# Patient Record
Sex: Male | Born: 1940
Health system: Southern US, Community
[De-identification: ages and names within clinical notes are randomized; demographics above are authoritative.]

## PROBLEM LIST (undated history)

## (undated) DIAGNOSIS — I6529 Occlusion and stenosis of unspecified carotid artery: Secondary | ICD-10-CM

## (undated) DIAGNOSIS — Z8719 Personal history of other diseases of the digestive system: Secondary | ICD-10-CM

## (undated) DIAGNOSIS — Z9889 Other specified postprocedural states: Secondary | ICD-10-CM

## (undated) DIAGNOSIS — E119 Type 2 diabetes mellitus without complications: Secondary | ICD-10-CM

## (undated) DIAGNOSIS — I739 Peripheral vascular disease, unspecified: Secondary | ICD-10-CM

## (undated) DIAGNOSIS — T8859XA Other complications of anesthesia, initial encounter: Secondary | ICD-10-CM

## (undated) DIAGNOSIS — D649 Anemia, unspecified: Secondary | ICD-10-CM

## (undated) DIAGNOSIS — Z860100 Personal history of colon polyps, unspecified: Secondary | ICD-10-CM

## (undated) DIAGNOSIS — Z9289 Personal history of other medical treatment: Secondary | ICD-10-CM

## (undated) DIAGNOSIS — M1990 Unspecified osteoarthritis, unspecified site: Secondary | ICD-10-CM

## (undated) DIAGNOSIS — E785 Hyperlipidemia, unspecified: Secondary | ICD-10-CM

## (undated) DIAGNOSIS — I452 Bifascicular block: Secondary | ICD-10-CM

## (undated) DIAGNOSIS — I10 Essential (primary) hypertension: Secondary | ICD-10-CM

## (undated) DIAGNOSIS — R000 Tachycardia, unspecified: Secondary | ICD-10-CM

## (undated) DIAGNOSIS — R112 Nausea with vomiting, unspecified: Secondary | ICD-10-CM

## (undated) DIAGNOSIS — K219 Gastro-esophageal reflux disease without esophagitis: Secondary | ICD-10-CM

## (undated) DIAGNOSIS — T4145XA Adverse effect of unspecified anesthetic, initial encounter: Secondary | ICD-10-CM

## (undated) HISTORY — PX: ANGIOPLASTY / STENTING FEMORAL: SUR30

## (undated) HISTORY — PX: ANGIOPLASTY / STENTING ILIAC: SUR31

## (undated) HISTORY — DX: Personal history of colonic polyps: Z86.010

## (undated) HISTORY — PX: KNEE ARTHROSCOPY: SUR90

## (undated) HISTORY — DX: Anemia, unspecified: D64.9

## (undated) HISTORY — PX: EYE SURGERY: SHX253

## (undated) HISTORY — DX: Bifascicular block: I45.2

## (undated) HISTORY — DX: Hyperlipidemia, unspecified: E78.5

## (undated) HISTORY — DX: Peripheral vascular disease, unspecified: I73.9

## (undated) HISTORY — DX: Personal history of colon polyps, unspecified: Z86.0100

## (undated) HISTORY — DX: Unspecified osteoarthritis, unspecified site: M19.90

## (undated) HISTORY — DX: Type 2 diabetes mellitus without complications: E11.9

## (undated) HISTORY — DX: Essential (primary) hypertension: I10

## (undated) HISTORY — PX: COLONOSCOPY W/ POLYPECTOMY: SHX1380

---

## 2001-06-29 HISTORY — PX: ROTATOR CUFF REPAIR: SHX139

## 2008-08-16 ENCOUNTER — Encounter: Admission: RE | Admit: 2008-08-16 | Discharge: 2008-08-30 | Payer: Self-pay | Admitting: Internal Medicine

## 2010-05-07 ENCOUNTER — Encounter: Admission: RE | Admit: 2010-05-07 | Discharge: 2010-05-07 | Payer: Self-pay | Admitting: Cardiovascular Disease

## 2010-05-12 ENCOUNTER — Inpatient Hospital Stay (HOSPITAL_COMMUNITY)
Admission: RE | Admit: 2010-05-12 | Discharge: 2010-05-13 | Payer: Self-pay | Source: Home / Self Care | Admitting: Cardiovascular Disease

## 2010-05-12 HISTORY — PX: FEMORAL ARTERY STENT: SHX1583

## 2010-06-18 ENCOUNTER — Inpatient Hospital Stay (HOSPITAL_COMMUNITY)
Admission: EM | Admit: 2010-06-18 | Discharge: 2010-06-20 | Payer: Self-pay | Source: Home / Self Care | Attending: Internal Medicine | Admitting: Internal Medicine

## 2010-06-19 ENCOUNTER — Encounter: Payer: Self-pay | Admitting: Gastroenterology

## 2010-06-20 ENCOUNTER — Encounter (INDEPENDENT_AMBULATORY_CARE_PROVIDER_SITE_OTHER): Payer: Self-pay | Admitting: *Deleted

## 2010-07-31 NOTE — Letter (Signed)
Summary: New Patient letter  [facility]  [address]   [place], [place] [address]   Phone: [phone]  Fax: [phone]       06/20/2010 MRN: [id number]  [facility] [address] [place], [place]  [address]  [place]  Dear Mr. [facility],  Welcome to the Gastroenterology Division at [facility].    You are scheduled to see Dr.  Arlyce Dice on 08/04/2010 at 10:00am on the 3rd floor at [facility], [address]  We ask that you try to arrive at our office 15 minutes prior to your appointment time to allow for check-in.  We would like you to complete the enclosed self-administered evaluation form prior to your visit and bring it with you on the day of your appointment.  We will review it with you.  Also, please bring a complete list of all your medications or, if you prefer, bring the medication bottles and we will list them.  Please bring your insurance card so that we may make a copy of it.  If your insurance requires a referral to see a specialist, please bring your referral form from your primary care physician.  Co-payments are due at the time of your visit and may be paid by cash, check or credit card.     Your office visit will consist of a consult with your physician (includes a physical exam), any laboratory testing he/she may order, scheduling of any necessary diagnostic testing (e.g. x-ray, ultrasound, CT-scan), and scheduling of a procedure (e.g. Endoscopy, Colonoscopy) if required.  Please allow enough time on your schedule to allow for any/all of these possibilities.    If you cannot keep your appointment, please call [phone] to cancel or reschedule prior to your appointment date.  This allows [place] the opportunity to schedule an appointment for another patient in need of care.  If you do not cancel or reschedule by 5 p.m. the business day prior to your appointment date, you will be charged a $50.00 late cancellation/no-show fee.    Thank you  for choosing [facility] for your medical needs.  We appreciate the opportunity to care for you.  Please visit [place] at our website  to learn more about our practice.                     Sincerely,                                                             The Gastroenterology Division

## 2010-07-31 NOTE — Procedures (Signed)
Summary: Upper Endoscopy  Patient: Christian Bailey Note: All result statuses are Final unless otherwise noted.  Tests: (1) Upper Endoscopy (EGD)   EGD Upper Endoscopy       DONE     [facility]     [address]     [place], [place]  [address]           ENDOSCOPY PROCEDURE REPORT           PATIENT:  Christian, Bailey  MR#:  [id number]     BIRTHDATE:  [birth year], 69 yrs. old  GENDER:  male           ENDOSCOPIST:  Barbette Hair. Arlyce Dice, MD     Referred by:  Chilton Greathouse, M.D.           PROCEDURE DATE:  06/19/2010     PROCEDURE:  EGD with biopsy, [address]     ASA CLASS:  Class II     INDICATIONS:  hemorrhage of GI tract           MEDICATIONS:   Fentanyl 75 mcg IV, Versed 4 mg IV, glycopyrrolate     (Robinal) 0.2 mg IV     TOPICAL ANESTHETIC:  Cetacaine Spray           DESCRIPTION OF PROCEDURE:   After the risks benefits and     alternatives of the procedure were thoroughly explained, informed     consent was obtained.  The [id number] ([id number]) endoscope was     introduced through the mouth and advanced to the third portion of     the duodenum, without limitations.  The instrument was slowly     withdrawn as the mucosa was fully examined.     <<PROCEDUREIMAGES>>           An ulcer was found pyloric channel 2cm clean-based ulcer. Bx taken     to r/o H (see image003 and image004). pylori  Otherwise the     examination was normal.    Retroflexed views revealed no     abnormalities.    The scope was then withdrawn from the patient     and the procedure completed.           COMPLICATIONS:  None           ENDOSCOPIC IMPRESSION:     1) Ulcer in the pyloric channel     2) Otherwise normal examination     RECOMMENDATIONS:     1) continue PPI           REPEAT EXAM:  No           ______________________________     Barbette Hair. Arlyce Dice, MD           CC:           n.     eSIGNED:   Barbette Hair. Kaplan at 06/19/2010 11:49 AM           [name], [phone]  Note: An  exclamation mark (!) indicates a result that was not dispersed into the flowsheet. Document Creation Date: 06/19/2010 11:59 AM _______________________________________________________________________  (1) Order result status: Final Collection or observation date-time: 06/19/2010 11:44 Requested date-time:  Receipt date-time:  Reported date-time:  Referring Physician:   Ordering Physician: Melvia Heaps [phone]) Specimen Source:  Source: Launa Grill Order Number: [id number] Lab site:

## 2010-08-11 ENCOUNTER — Ambulatory Visit (INDEPENDENT_AMBULATORY_CARE_PROVIDER_SITE_OTHER): Payer: Medicare Other | Admitting: Gastroenterology

## 2010-08-11 ENCOUNTER — Encounter: Payer: Self-pay | Admitting: Gastroenterology

## 2010-08-11 DIAGNOSIS — K260 Acute duodenal ulcer with hemorrhage: Secondary | ICD-10-CM | POA: Insufficient documentation

## 2010-08-11 DIAGNOSIS — I2510 Atherosclerotic heart disease of native coronary artery without angina pectoris: Secondary | ICD-10-CM | POA: Insufficient documentation

## 2010-08-11 DIAGNOSIS — E119 Type 2 diabetes mellitus without complications: Secondary | ICD-10-CM | POA: Insufficient documentation

## 2010-08-20 NOTE — Assessment & Plan Note (Signed)
Summary: FU ULCER,CONSULT SCREEN COLON JJ/SCHED W-SARA PA/MEDICARE/CX .[name] KitchenMarland Kitchen   History of Present Illness Visit Type: Follow-up Consult Primary GI MD: Melvia Heaps MD [facility] Primary Provider: Lilli Few, MD Chief Complaint: Ulcer , consult screen  History of Present Illness:   Christian Bailey has returned following hospitalization for acute upper GI bleed.  A pyloric channel ulcer was seen.  He was treated medically including blood transfusions and discharged on twice a day Nexium.  Biopsies were negative for H. pylori.  He has felt well since discharge.  He is back on Plavix.  He has no other GI complaints.  He probably underwent a colonoscopy about 5-7 years ago where a small polyp was removed.  He was not told that he required any followup.   GI Review of Systems      Denies abdominal pain, acid reflux, belching, bloating, chest pain, dysphagia with liquids, dysphagia with solids, heartburn, loss of appetite, nausea, vomiting, vomiting blood, weight loss, and  weight gain.        Denies anal fissure, black tarry stools, change in bowel habit, constipation, diarrhea, diverticulosis, fecal incontinence, heme positive stool, hemorrhoids, irritable bowel syndrome, jaundice, light color stool, liver problems, rectal bleeding, and  rectal pain. Preventive Screening-Counseling & Management  Alcohol-Tobacco     Smoking Status: quit      Drug Use:  no.      Current Medications (verified): 1)  Nexium 40 Mg Cpdr (Esomeprazole Magnesium) .... Take 1 Capsule By Mouth Once A Day 2)  Crestor 20 Mg Tabs (Rosuvastatin Calcium) .... Once Daily 3)  Cilostazol 50 Mg Tabs (Cilostazol) .[name].. 1 By Mouth Two Times A Day 4)  Glucosamine Sulfate 1000 Mg Tabs (Glucosamine Sulfate) .[name].. 1 By Mouth Once Daily 5)  Lisinopril 5 Mg Tabs (Lisinopril) .[name].. 1 By Mouth Two Times A Day 6)  Metformin Hcl 500 Mg Tabs (Metformin Hcl) .[name].. 1 By Mouth Two Times A Day 7)  Toprol Xl 25 Mg Xr24h-Tab (Metoprolol Succinate) .[name].. 1 By  Mouth Once Daily 8)  Plavix 75 Mg Tabs (Clopidogrel Bisulfate) .[name].. 1 By Mouth Once Daily 9)  Ferrous Sulfate 325 (65 Fe) Mg Tbec (Ferrous Sulfate) .... Once Daily 10)  Aspirin 81 Mg Tbec (Aspirin) .... Once Daily 11)  Coq10 30 Mg Caps (Coenzyme Q10) .... Once Daily  Allergies (verified): No Known Drug Allergies  Past History:  Past Medical History: Reviewed history from 08/07/2010 and no changes required. Osteoarthritis Hyperlipidemia Upper Gastrointestinal bleed Acute Blood Anemia CAD Type 2 Diabetes  Past Surgical History: Knee Arthroscopy PTCA-Stent( Thighs) Rotator Cuff Repair  Family History: Leukemia: Brother x 2and Mother Family History of Heart Disease: Mother Family History of Esophageal Cancer:Brother Family History of Stomach Cancer:Father  Social History: Occupation: Retired Patient is a former smoker.  Alcohol Use - no Daily Caffeine Use Illicit Drug Use - no Smoking Status:  quit Drug Use:  no  Review of Systems  The patient denies allergy/sinus, anemia, anxiety-new, arthritis/joint pain, back pain, blood in urine, breast changes/lumps, change in vision, confusion, cough, coughing up blood, depression-new, fainting, fatigue, fever, headaches-new, hearing problems, heart murmur, heart rhythm changes, itching, menstrual pain, muscle pains/cramps, night sweats, nosebleeds, pregnancy symptoms, shortness of breath, skin rash, sleeping problems, sore throat, swelling of feet/legs, swollen lymph glands, thirst - excessive , urination - excessive , urination changes/pain, urine leakage, vision changes, and voice change.    Vital Signs:  Patient profile:   70 year old male Height:      65 inches  Weight:      166.50 pounds BMI:     27.81 Pulse rate:   88 / minute Pulse rhythm:   regular BP sitting:   110 / 68  (left arm) Cuff size:   regular  Vitals Entered By: June McMurray CMA [name]) (August 11, 2010 10:45 AM)  Physical Exam  Additional Exam:  On  physical exam he is well-developed well-nourished male  skin: anicteric HEENT: normocephalic; PEERLA; no nasal or pharyngeal abnormalities neck: supple nodes: no cervical lymphadenopathy chest: clear to ausculatation and percussion heart: no murmurs, gallops, or rubs abd: soft, nontender; BS normoactive; no abdominal masses, tenderness, organomegaly rectal: deferred ext: no cynanosis, clubbing, edema skeletal: no deformities neuro: oriented x 3; no focal abnormalities    Impression & Recommendations:  Problem # 1:  DUODENAL ULCER, ACUTE, HEMORRHAGE (ICD-532.00) Assessment Improved Plan to continue Nexium once a day indefinitely  Problem # 2:  CAD (ICD-414.00) Assessment: Comment Only  Problem # 3:  DM (ICD-250.00) Assessment: Comment Only  Patient Instructions: 1)  Copy sent to : Rava Avva, MD 2)  Follow up as needed  3)  The medication list was reviewed and reconciled.  All changed / newly prescribed medications were explained.  A complete medication list was provided to the patient / caregiver.

## 2010-09-04 ENCOUNTER — Encounter: Payer: Self-pay | Admitting: Gastroenterology

## 2010-09-08 LAB — CROSSMATCH
ABO/RH(D): O POS
Antibody Screen: NEGATIVE
Unit division: 0

## 2010-09-08 LAB — CBC
HCT: 22.8 % — ABNORMAL LOW (ref 39.0–52.0)
HCT: 24.1 % — ABNORMAL LOW (ref 39.0–52.0)
HCT: 27 % — ABNORMAL LOW (ref 39.0–52.0)
Hemoglobin: 7.9 g/dL — ABNORMAL LOW (ref 13.0–17.0)
Hemoglobin: 8.3 g/dL — ABNORMAL LOW (ref 13.0–17.0)
Hemoglobin: 9.3 g/dL — ABNORMAL LOW (ref 13.0–17.0)
MCH: 30.1 pg (ref 26.0–34.0)
MCH: 30.3 pg (ref 26.0–34.0)
MCH: 30.5 pg (ref 26.0–34.0)
MCH: 30.6 pg (ref 26.0–34.0)
MCH: 30.7 pg (ref 26.0–34.0)
MCHC: 34.1 g/dL (ref 30.0–36.0)
MCHC: 34.4 g/dL (ref 30.0–36.0)
MCHC: 34.4 g/dL (ref 30.0–36.0)
MCHC: 34.4 g/dL (ref 30.0–36.0)
MCHC: 34.6 g/dL (ref 30.0–36.0)
MCV: 87.9 fL (ref 78.0–100.0)
MCV: 88.8 fL (ref 78.0–100.0)
MCV: 89.3 fL (ref 78.0–100.0)
Platelets: 187 10*3/uL (ref 150–400)
Platelets: 209 10*3/uL (ref 150–400)
Platelets: 221 10*3/uL (ref 150–400)
RBC: 2.42 MIL/uL — ABNORMAL LOW (ref 4.22–5.81)
RBC: 2.7 MIL/uL — ABNORMAL LOW (ref 4.22–5.81)
RBC: 3.07 MIL/uL — ABNORMAL LOW (ref 4.22–5.81)
RDW: 13.3 % (ref 11.5–15.5)
RDW: 13.3 % (ref 11.5–15.5)
RDW: 13.4 % (ref 11.5–15.5)
RDW: 13.7 % (ref 11.5–15.5)
RDW: 14 % (ref 11.5–15.5)
WBC: 7.7 10*3/uL (ref 4.0–10.5)
WBC: 8.7 10*3/uL (ref 4.0–10.5)

## 2010-09-08 LAB — BASIC METABOLIC PANEL
BUN: 16 mg/dL (ref 6–23)
BUN: 25 mg/dL — ABNORMAL HIGH (ref 6–23)
CO2: 28 mEq/L (ref 19–32)
CO2: 29 mEq/L (ref 19–32)
Calcium: 7.9 mg/dL — ABNORMAL LOW (ref 8.4–10.5)
Calcium: 8.3 mg/dL — ABNORMAL LOW (ref 8.4–10.5)
Chloride: 103 mEq/L (ref 96–112)
Chloride: 109 mEq/L (ref 96–112)
Creatinine, Ser: 0.89 mg/dL (ref 0.4–1.5)
Creatinine, Ser: 0.93 mg/dL (ref 0.4–1.5)
GFR calc Af Amer: >60 mL/min (ref >60)
GFR calc Af Amer: >60 mL/min (ref >60)
GFR calc non Af Amer: >60 mL/min (ref >60)
Glucose, Bld: 115 mg/dL — ABNORMAL HIGH (ref 70–99)
Potassium: 3.9 mEq/L (ref 3.5–5.1)
Sodium: 140 mEq/L (ref 135–145)

## 2010-09-08 LAB — GLUCOSE, CAPILLARY
Glucose-Capillary: 103 mg/dL — ABNORMAL HIGH (ref 70–99)
Glucose-Capillary: 126 mg/dL — ABNORMAL HIGH (ref 70–99)
Glucose-Capillary: 139 mg/dL — ABNORMAL HIGH (ref 70–99)
Glucose-Capillary: 216 mg/dL — ABNORMAL HIGH (ref 70–99)
Glucose-Capillary: 235 mg/dL — ABNORMAL HIGH (ref 70–99)

## 2010-09-08 LAB — H. PYLORI ANTIBODY, IGG: H Pylori IgG: 2.1 {ISR} — ABNORMAL HIGH

## 2010-09-09 LAB — CBC
HCT: 37.2 % — ABNORMAL LOW (ref 39.0–52.0)
MCH: 29.7 pg (ref 26.0–34.0)
MCV: 89 fL (ref 78.0–100.0)
Platelets: 267 10*3/uL (ref 150–400)
RBC: 4.18 MIL/uL — ABNORMAL LOW (ref 4.22–5.81)

## 2010-09-09 LAB — GLUCOSE, CAPILLARY: Glucose-Capillary: 235 mg/dL — ABNORMAL HIGH (ref 70–99)

## 2010-09-09 LAB — BASIC METABOLIC PANEL
CO2: 28 mEq/L (ref 19–32)
Chloride: 105 mEq/L (ref 96–112)
Creatinine, Ser: 1.02 mg/dL (ref 0.4–1.5)
GFR calc Af Amer: >60 mL/min (ref >60)
Potassium: 3.8 mEq/L (ref 3.5–5.1)

## 2010-11-11 NOTE — Procedures (Signed)
Christian Bailey, Christian Bailey NO.:  [id number]   MEDICAL RECORD NO.:  [id number]          PATIENT TYPE:  INP   LOCATION:  6525                         FACILITY:  [facility]   PHYSICIAN:  Nanetta Batty, M.D.   DATE OF BIRTH:  1941-03-27   DATE OF PROCEDURE:  DATE OF DISCHARGE:                    PERIPHERAL VASCULAR INVASIVE PROCEDURE    Abdominal aortogram with bifemoral runoff, PTA and stent report.   The patient is a 70 year old married Caucasian male father of 2,  grandfather to no grandchildren, who I just saw in the office on April 10, 2010.  He relocated from the [facility] to [place] several  years ago.  He does have PVOD status post left external iliac artery PTA  and stenting as well as PTA of right SFA back in 2006.  His risk factors  include remote tobacco abuse, type 2 diabetes, and dyslipidemia.  He had  negative a Myoview in our office on October 04, 2007, with normal 2-D echo  recently.  Dr. Felipa Eth saw his lipid profile.  We have been getting  Dopplers on him which was recently performed a month ago that showed  increased velocities in the left external iliac artery and right SFA.  He has had increasing claudication, left greater than right.  He  presents now for angiography and potential intervention.   PROCEDURE DESCRIPTION:  The patient was brought to the second floor of  [facility] in the postabsorptive state.  He was  premedicated with p.o. Valium.  His left groin was prepped and shaved in  usual sterile fashion.  Xylocaine 1% was used for local anesthesia.  A 5-  French sheath was inserted into the left femoral artery using standard  Seldinger technique.  A 5-French pigtail catheter was used for abdominal  aortography with bifemoral runoff using bolus chase digital subtraction  step-table technique.  Visipaque dye was used for the entirety of the  case.  Retrograde aortic pressures were monitored during the case.   ANGIOGRAPHIC RESULT:  1. Abdominal aorta:      a.     Renal arteries - normal.      b.     Infrarenal abdominal aorta - normal.  2. Left lower extremity:      a.     Patent left external iliac artery stent.      b.     Focal 90% left common femoral artery stenosis with       obstruction to flow around a 5-French sheath.      c.     Total SFA with reconstitution of Hunter canal and two-vessel       runoff.  Posterior tibial was occluded and filled by collaterals.  3. Right lower extremity:      a.     Segmental 80% proximal followed by 60% to 70% segmental mid       right SFA with three-vessel runoff.   The patient received a total of 5000 units of heparin intravenously.  Contralateral access was obtained with a crossover catheter and 6-French  Ansel sheath.  The patient received 325  mg of aspirin and was already on  Plavix.  The Versacore wire was used to cross the SFA lesions and PTA  was performed with 4 x 4 balloons.  Stenting was then performed with a 6  x 100 Abbott Absolute stent distally and a 6 x 60 Cook Zilver stent  proximally.  Postdilatation performed with a 5 x 60 Fox Cross balloon in  both locations resulting in reduction of the proximal segmental 80%, mid  segmental 60% to 70% to 0% residual.  The sheath was then withdrawn  across the bifurcation.  The left common femoral artery stenosis was  predilated with a 5 x 2 balloon and stented with a 7 x 3 Smart stent  resulting in reduction of 90% stenosis to 0% residual with a small  subintimal flap distally that was non flow-limiting   IMPRESSION:  Successful percutaneous transluminal angioplasty and  stenting of the right superficial femoral artery in two locations, left  common femoral with nitinol self-expanding stents.  The sheath was sewn  securely in place.  The patient left the lab in stable condition.  He  will be hydrated overnight, treated with aspirin and Plavix.  The sheath  will be removed once the ACT falls  below 170.  He left the lab in stable  condition.      Nanetta Batty, M.D.      JB/MEDQ  D:  05/12/2010  T:  05/13/2010  Job:  [id number]   cc:   [facility]  [facility]  Larina Earthly, M.D.   Electronically Signed by Nanetta Batty M.D. on 05/16/2010 12:05:01 PM

## 2011-07-02 DIAGNOSIS — M25519 Pain in unspecified shoulder: Secondary | ICD-10-CM | POA: Diagnosis not present

## 2011-07-02 DIAGNOSIS — M19019 Primary osteoarthritis, unspecified shoulder: Secondary | ICD-10-CM | POA: Diagnosis not present

## 2011-07-03 DIAGNOSIS — L84 Corns and callosities: Secondary | ICD-10-CM | POA: Diagnosis not present

## 2011-07-03 DIAGNOSIS — L03039 Cellulitis of unspecified toe: Secondary | ICD-10-CM | POA: Diagnosis not present

## 2011-07-03 DIAGNOSIS — E119 Type 2 diabetes mellitus without complications: Secondary | ICD-10-CM | POA: Diagnosis not present

## 2011-07-03 DIAGNOSIS — L609 Nail disorder, unspecified: Secondary | ICD-10-CM | POA: Diagnosis not present

## 2011-09-07 DIAGNOSIS — Z961 Presence of intraocular lens: Secondary | ICD-10-CM | POA: Diagnosis not present

## 2011-09-21 DIAGNOSIS — I70219 Atherosclerosis of native arteries of extremities with intermittent claudication, unspecified extremity: Secondary | ICD-10-CM | POA: Diagnosis not present

## 2011-09-21 DIAGNOSIS — I739 Peripheral vascular disease, unspecified: Secondary | ICD-10-CM | POA: Diagnosis not present

## 2011-09-22 DIAGNOSIS — L608 Other nail disorders: Secondary | ICD-10-CM | POA: Diagnosis not present

## 2011-09-22 DIAGNOSIS — E119 Type 2 diabetes mellitus without complications: Secondary | ICD-10-CM | POA: Diagnosis not present

## 2011-10-26 DIAGNOSIS — H35369 Drusen (degenerative) of macula, unspecified eye: Secondary | ICD-10-CM | POA: Diagnosis not present

## 2011-10-26 DIAGNOSIS — H33109 Unspecified retinoschisis, unspecified eye: Secondary | ICD-10-CM | POA: Diagnosis not present

## 2011-10-26 DIAGNOSIS — E1139 Type 2 diabetes mellitus with other diabetic ophthalmic complication: Secondary | ICD-10-CM | POA: Diagnosis not present

## 2011-10-26 DIAGNOSIS — H353190 Nonexudative age-related macular degeneration, unspecified eye, stage unspecified: Secondary | ICD-10-CM | POA: Diagnosis not present

## 2011-10-27 DIAGNOSIS — I2510 Atherosclerotic heart disease of native coronary artery without angina pectoris: Secondary | ICD-10-CM | POA: Diagnosis not present

## 2011-10-27 DIAGNOSIS — Z125 Encounter for screening for malignant neoplasm of prostate: Secondary | ICD-10-CM | POA: Diagnosis not present

## 2011-10-27 DIAGNOSIS — E785 Hyperlipidemia, unspecified: Secondary | ICD-10-CM | POA: Diagnosis not present

## 2011-10-27 DIAGNOSIS — E119 Type 2 diabetes mellitus without complications: Secondary | ICD-10-CM | POA: Diagnosis not present

## 2011-10-27 DIAGNOSIS — I10 Essential (primary) hypertension: Secondary | ICD-10-CM | POA: Diagnosis not present

## 2011-11-02 DIAGNOSIS — H35359 Cystoid macular degeneration, unspecified eye: Secondary | ICD-10-CM | POA: Diagnosis not present

## 2011-11-02 DIAGNOSIS — E1139 Type 2 diabetes mellitus with other diabetic ophthalmic complication: Secondary | ICD-10-CM | POA: Diagnosis not present

## 2011-11-03 DIAGNOSIS — I2510 Atherosclerotic heart disease of native coronary artery without angina pectoris: Secondary | ICD-10-CM | POA: Diagnosis not present

## 2011-11-03 DIAGNOSIS — K279 Peptic ulcer, site unspecified, unspecified as acute or chronic, without hemorrhage or perforation: Secondary | ICD-10-CM | POA: Diagnosis not present

## 2011-11-03 DIAGNOSIS — Z0000 Encounter for general adult medical examination without abnormal findings: Secondary | ICD-10-CM | POA: Diagnosis not present

## 2011-11-03 DIAGNOSIS — Z125 Encounter for screening for malignant neoplasm of prostate: Secondary | ICD-10-CM | POA: Diagnosis not present

## 2011-11-05 DIAGNOSIS — H353290 Exudative age-related macular degeneration, unspecified eye, stage unspecified: Secondary | ICD-10-CM | POA: Diagnosis not present

## 2011-11-05 DIAGNOSIS — H35059 Retinal neovascularization, unspecified, unspecified eye: Secondary | ICD-10-CM | POA: Diagnosis not present

## 2011-11-09 DIAGNOSIS — Z1212 Encounter for screening for malignant neoplasm of rectum: Secondary | ICD-10-CM | POA: Diagnosis not present

## 2011-11-30 DIAGNOSIS — E782 Mixed hyperlipidemia: Secondary | ICD-10-CM | POA: Diagnosis not present

## 2011-11-30 DIAGNOSIS — I10 Essential (primary) hypertension: Secondary | ICD-10-CM | POA: Diagnosis not present

## 2011-11-30 DIAGNOSIS — R9431 Abnormal electrocardiogram [ECG] [EKG]: Secondary | ICD-10-CM | POA: Diagnosis not present

## 2011-11-30 DIAGNOSIS — E119 Type 2 diabetes mellitus without complications: Secondary | ICD-10-CM | POA: Diagnosis not present

## 2011-12-01 DIAGNOSIS — L608 Other nail disorders: Secondary | ICD-10-CM | POA: Diagnosis not present

## 2011-12-01 DIAGNOSIS — E119 Type 2 diabetes mellitus without complications: Secondary | ICD-10-CM | POA: Diagnosis not present

## 2011-12-10 DIAGNOSIS — H35059 Retinal neovascularization, unspecified, unspecified eye: Secondary | ICD-10-CM | POA: Diagnosis not present

## 2011-12-10 DIAGNOSIS — H353290 Exudative age-related macular degeneration, unspecified eye, stage unspecified: Secondary | ICD-10-CM | POA: Diagnosis not present

## 2011-12-15 ENCOUNTER — Ambulatory Visit: Payer: Medicare Other | Admitting: Gastroenterology

## 2012-01-13 DIAGNOSIS — H353290 Exudative age-related macular degeneration, unspecified eye, stage unspecified: Secondary | ICD-10-CM | POA: Diagnosis not present

## 2012-01-13 DIAGNOSIS — H35059 Retinal neovascularization, unspecified, unspecified eye: Secondary | ICD-10-CM | POA: Diagnosis not present

## 2012-01-25 ENCOUNTER — Encounter: Payer: Self-pay | Admitting: Gastroenterology

## 2012-01-25 ENCOUNTER — Ambulatory Visit (INDEPENDENT_AMBULATORY_CARE_PROVIDER_SITE_OTHER): Payer: Medicare Other | Admitting: Gastroenterology

## 2012-01-25 ENCOUNTER — Telehealth: Payer: Self-pay | Admitting: *Deleted

## 2012-01-25 VITALS — BP 90/48 | HR 96 | Ht 65.0 in | Wt 165.2 lb

## 2012-01-25 DIAGNOSIS — Z860100 Personal history of colon polyps, unspecified: Secondary | ICD-10-CM | POA: Insufficient documentation

## 2012-01-25 MED ORDER — PEG-KCL-NACL-NASULF-NA ASC-C 100 G PO SOLR: 1.0000 | Freq: Once | ORAL | Status: DC

## 2012-01-25 NOTE — Assessment & Plan Note (Addendum)
The patient gives a history of colon polyps and was last examined over 5 years ago. Pathology reports are not available.[name] He will  be scheduled with colonoscopy and Plavix will be held provided that his cardiologist agrees to this.

## 2012-01-25 NOTE — Progress Notes (Signed)
................   History of Present Illness: Pleasant 71 year old white male with history of colon polyps and bleeding duodenal ulcer referred at the request of Dr. Felipa Eth for colonoscopy.  5 years ago he underwent colonoscopy and polypectomy in [place] and was told to have a 5 year followup. He has no GI complaints including change of bowel habits, abdominal pain, melena or hematochezia. He has a  history of a bleeding duodenal ulcer in December, 2011. Patient is on Plavix.    Past Medical History  Diagnosis Date  . Osteoarthritis   . Hyperlipemia   . GI bleed   . Anemia   . CAD (coronary artery disease)   . Diabetes mellitus   . Hypertension   . History of colon polyps    Past Surgical History  Procedure Date  . Knee arthroscopy     left  . Angioplasty / stenting femoral   . Rotator cuff repair     right  . Angioplasty / stenting iliac    family history includes Diabetes in his brother; Esophageal cancer in his brother; Heart disease in his mother; Leukemia in his brother and mother; and Stomach cancer in his father. Current Outpatient Prescriptions  Medication Sig Dispense Refill  . cilostazol (PLETAL) 50 MG tablet Take 50 mg by mouth 2 (two) times daily.      . clopidogrel (PLAVIX) 75 MG tablet Take 75 mg by mouth daily.      [name] co-enzyme Q-10 30 MG capsule Take 30 mg by mouth daily.      [name] esomeprazole (NEXIUM) 40 MG capsule Take 40 mg by mouth daily before breakfast.      . lisinopril (PRINIVIL,ZESTRIL) 5 MG tablet Take 5 mg by mouth daily.      . metFORMIN (GLUCOPHAGE) 500 MG tablet Take 500 mg by mouth 2 (two) times daily with a meal.      . metoprolol succinate (TOPROL-XL) 25 MG 24 hr tablet Take 25 mg by mouth daily.      . rosuvastatin (CRESTOR) 20 MG tablet Take 20 mg by mouth daily.       Allergies as of 01/25/2012  . (No Known Allergies)    reports that he has quit smoking. He has never used smokeless tobacco. He reports that he drinks alcohol. He reports that  he does not use illicit drugs.     Review of Systems: He complains of claudication with extensive walking Pertinent positive and negative review of systems were noted in the above HPI section. All other review of systems were otherwise negative.  Vital signs were reviewed in today's medical record Physical Exam: General: Well developed , well nourished, no acute distress Head: Normocephalic and atraumatic Eyes:  sclerae anicteric, EOMI Ears: Normal auditory acuity Mouth: No deformity or lesions Neck: Supple, no masses or thyromegaly Lungs: Clear throughout to auscultation Heart: Regular rate and rhythm; no murmurs, rubs or bruits Abdomen: Soft, non tender and non distended. No masses, hepatosplenomegaly or hernias noted. Normal Bowel sounds Rectal:deferred Musculoskeletal: Symmetrical with no gross deformities  Skin: No lesions on visible extremities Pulses:  Normal pulses noted Extremities: No clubbing, cyanosis, edema or deformities noted Neurological: Alert oriented x 4, grossly nonfocal Cervical Nodes:  No significant cervical adenopathy Inguinal Nodes: No significant inguinal adenopathy Psychological:  Alert and cooperative. Normal mood and affect

## 2012-01-25 NOTE — Telephone Encounter (Signed)
[facility] [address] [place], [place] [address] [phone] phone [phone] fax 01/25/2012    RE: Christian Bailey DOB: 04/18/1941 MRN: [id number]   Dear Dr Allyson Sabal   We have scheduled the above patient for an endoscopic procedure. Our records show that he is on anticoagulation therapy.   Please advise as to how long the patient may come off his therapy of Plavix prior to the procedure, which is scheduled for 03/15/2012.  Please fax back/ or route the completed form to Shanequia Kendrick at [phone].   Sincerely,  Merri Ray

## 2012-01-25 NOTE — Patient Instructions (Addendum)
You will be contacted by our office prior to your procedure for directions on holding your Plavix.  If you do not hear from our office 1 week prior to your scheduled procedure, please call [phone] to discuss.   Separate instructions have been given

## 2012-02-02 DIAGNOSIS — L84 Corns and callosities: Secondary | ICD-10-CM | POA: Diagnosis not present

## 2012-02-02 DIAGNOSIS — E119 Type 2 diabetes mellitus without complications: Secondary | ICD-10-CM | POA: Diagnosis not present

## 2012-02-02 DIAGNOSIS — L608 Other nail disorders: Secondary | ICD-10-CM | POA: Diagnosis not present

## 2012-02-17 DIAGNOSIS — H35059 Retinal neovascularization, unspecified, unspecified eye: Secondary | ICD-10-CM | POA: Diagnosis not present

## 2012-02-17 DIAGNOSIS — H353290 Exudative age-related macular degeneration, unspecified eye, stage unspecified: Secondary | ICD-10-CM | POA: Diagnosis not present

## 2012-03-07 ENCOUNTER — Telehealth: Payer: Self-pay | Admitting: Gastroenterology

## 2012-03-07 NOTE — Telephone Encounter (Signed)
Called and spoke with [facility] . She is going to give Dr Renelda Mom nurse the message to call me back. 2 letters sent and one phone call still waiting on Plavix clearence

## 2012-03-07 NOTE — Telephone Encounter (Signed)
Still waiting to hear back form [facility]

## 2012-03-08 NOTE — Telephone Encounter (Signed)
Received fax from Dr Renelda Mom office. Stating it would be ok for the pt to hold the Plavix 5 days prior to procedure  They are to fax a form to me tomorrow when Dr berry is back in the office for our varifacation

## 2012-03-09 NOTE — Telephone Encounter (Signed)
Pt approved to hold plavix 5 days before  Patient aware to hold

## 2012-03-09 NOTE — Telephone Encounter (Signed)
Patient aware to hold plavix 5 days before procedure per Dr Allyson Sabal. Paper of approval to scanned in

## 2012-03-15 ENCOUNTER — Ambulatory Visit (AMBULATORY_SURGERY_CENTER): Payer: Medicare Other | Admitting: Gastroenterology

## 2012-03-15 ENCOUNTER — Encounter: Payer: Self-pay | Admitting: Gastroenterology

## 2012-03-15 VITALS — BP 113/66 | HR 88 | Temp 97.0°F | Resp 18 | Ht 65.0 in | Wt 165.0 lb

## 2012-03-15 DIAGNOSIS — D126 Benign neoplasm of colon, unspecified: Secondary | ICD-10-CM

## 2012-03-15 DIAGNOSIS — Z860100 Personal history of colon polyps, unspecified: Secondary | ICD-10-CM

## 2012-03-15 DIAGNOSIS — K5730 Diverticulosis of large intestine without perforation or abscess without bleeding: Secondary | ICD-10-CM

## 2012-03-15 LAB — GLUCOSE, CAPILLARY
Glucose-Capillary: 129 mg/dL — ABNORMAL HIGH (ref 70–99)
Glucose-Capillary: 98 mg/dL (ref 70–99)

## 2012-03-15 MED ORDER — SODIUM CHLORIDE 0.9 % IV SOLN: 500.0000 mL | INTRAVENOUS | Status: DC

## 2012-03-15 NOTE — Progress Notes (Signed)
Patient did not have preoperative order for IV antibiotic SSI prophylaxis. (G8918)  Patient did not experience any of the following events: a burn prior to discharge; a fall within the facility; wrong site/side/patient/procedure/implant event; or a hospital transfer or hospital admission upon discharge from the facility. (G8907)  

## 2012-03-15 NOTE — Op Note (Signed)
[facility] [address] [place], [address]   COLONOSCOPY PROCEDURE REPORT  PATIENT: Christian Bailey, Christian Bailey  MR#: [id number] BIRTHDATE: Mar 24, 1941 , 71  yrs. old GENDER: Male ENDOSCOPIST: Louis Meckel, MD REFERRED BY: PROCEDURE DATE:  03/15/2012 PROCEDURE:   Colonoscopy with snare polypectomy ASA CLASS:   Class II INDICATIONS: MEDICATIONS: MAC sedation, administered by CRNA and propofol (Diprivan) 150mg  IV  DESCRIPTION OF PROCEDURE:   After the risks benefits and alternatives of the procedure were thoroughly explained, informed consent was obtained.  A digital rectal exam revealed no abnormalities of the rectum.   The LB [id number]  endoscope was introduced through the anus and advanced to the cecum, which was identified by both the appendix and ileocecal valve. No adverse events experienced.   The quality of the prep was Suprep excellent The instrument was then slowly withdrawn as the colon was fully examined.      COLON FINDINGS: A sessile polyp measuring 5 mm in size was found at the cecum.  A polypectomy was performed with a cold snare.  The resection was complete and the polyp tissue was completely retrieved.   Mild diverticulosis was noted in the transverse colon. The colon mucosa was otherwise normal.  Retroflexed views revealed no abnormalities. The time to cecum=2 minutes 20 seconds. Withdrawal time=7 minutes 44 seconds.  The scope was withdrawn and the procedure completed. COMPLICATIONS: There were no complications.  ENDOSCOPIC IMPRESSION: 1.   Sessile polyp measuring 5 mm in size was found at the cecum; polypectomy was performed with a cold snare 2.   Mild diverticulosis was noted in the transverse colon 3.   The colon mucosa was otherwise normal  RECOMMENDATIONS: 1.  If the polyp(s) removed today are proven to be adenomatous (pre-cancerous) polyps, you will need a repeat colonoscopy in 5 years.  Otherwise you should continue to follow  colorectal cancer screening guidelines for "routine risk" patients with colonoscopy in 10 years.  You will receive a letter within 1-2 weeks with the results of your biopsy as well as final recommendations.  Please call my office if you have not received a letter after 3 weeks. 2.  Resume plavix in 5 days   eSigned:  Louis Meckel, MD 03/15/2012 8:32 AM   cc:   PATIENT NAME:  [name], [name] MR#: [id number]

## 2012-03-15 NOTE — Patient Instructions (Addendum)
Restart plavix in 5 daysYOU HAD AN ENDOSCOPIC PROCEDURE TODAY AT THE [facility]: Refer to the procedure report that was given to you for any specific questions about what was found during the examination.  If the procedure report does not answer your questions, please call your gastroenterologist to clarify.  If you requested that your care partner not be given the details of your procedure findings, then the procedure report has been included in a sealed envelope for you to review at your convenience later.  YOU SHOULD EXPECT: Some feelings of bloating in the abdomen. Passage of more gas than usual.  Walking can help get rid of the air that was put into your GI tract during the procedure and reduce the bloating. If you had a lower endoscopy (such as a colonoscopy or flexible sigmoidoscopy) you may notice spotting of blood in your stool or on the toilet paper. If you underwent a bowel prep for your procedure, then you may not have a normal bowel movement for a few days.  DIET: Your first meal following the procedure should be a light meal and then it is ok to progress to your normal diet.  A half-sandwich or bowl of soup is an example of a good first meal.  Heavy or fried foods are harder to digest and may make you feel nauseous or bloated.  Likewise meals heavy in dairy and vegetables can cause extra gas to form and this can also increase the bloating.  Drink plenty of fluids but you should avoid alcoholic beverages for 24 hours.  ACTIVITY: Your care partner should take you home directly after the procedure.  You should plan to take it easy, moving slowly for the rest of the day.  You can resume normal activity the day after the procedure however you should NOT DRIVE or use heavy machinery for 24 hours (because of the sedation medicines used during the test).    SYMPTOMS TO REPORT IMMEDIATELY: A gastroenterologist can be reached at any hour.  During normal business hours, 8:30 AM to 5:00 PM  Monday through Friday, call [phone].  After hours and on weekends, please call the GI answering service at [phone] who will take a message and have the physician on call contact you.   Following lower endoscopy (colonoscopy or flexible sigmoidoscopy):  Excessive amounts of blood in the stool  Significant tenderness or worsening of abdominal pains  Swelling of the abdomen that is new, acute  Fever of 100F or higher    FOLLOW UP: If any biopsies were taken you will be contacted by phone or by letter within the next 1-3 weeks.  Call your gastroenterologist if you have not heard about the biopsies in 3 weeks.  Our staff will call the home number listed on your records the next business day following your procedure to check on you and address any questions or concerns that you may have at that time regarding the information given to you following your procedure. This is a courtesy call and so if there is no answer at the home number and we have not heard from you through the emergency physician on call, we will assume that you have returned to your regular daily activities without incident.  SIGNATURES/CONFIDENTIALITY: You and/or your care partner have signed paperwork which will be entered into your electronic medical record.  These signatures attest to the fact that that the information above on your After Visit Summary has been reviewed and is understood.  Full  responsibility of the confidentiality of this discharge information lies with you and/or your care-partner.     INFORMATION ON POLYPS GIVEN TO YOU TODAY AND INFORMATION ON DIVERTICULOSIS AND HIGH FIBER DIET GIVEN TO YOU AS WELL

## 2012-03-16 ENCOUNTER — Telehealth: Payer: Self-pay | Admitting: *Deleted

## 2012-03-16 NOTE — Telephone Encounter (Signed)
  Follow up Call-  Call back number 03/15/2012  Post procedure Call Back phone  # [phone]  Permission to leave phone message Yes     Patient questions:  Do you have a fever, pain , or abdominal swelling? no Pain Score  0 *  Have you tolerated food without any problems? yes  Have you been able to return to your normal activities? yes  Do you have any questions about your discharge instructions: Diet   no Medications  no Follow up visit  no  Do you have questions or concerns about your Care? no  Actions: * If pain score is 4 or above: No action needed, pain <4.

## 2012-03-22 ENCOUNTER — Encounter: Payer: Self-pay | Admitting: Gastroenterology

## 2012-03-23 DIAGNOSIS — H35429 Microcystoid degeneration of retina, unspecified eye: Secondary | ICD-10-CM | POA: Diagnosis not present

## 2012-03-23 DIAGNOSIS — H353290 Exudative age-related macular degeneration, unspecified eye, stage unspecified: Secondary | ICD-10-CM | POA: Diagnosis not present

## 2012-03-23 DIAGNOSIS — H35059 Retinal neovascularization, unspecified, unspecified eye: Secondary | ICD-10-CM | POA: Diagnosis not present

## 2012-04-05 DIAGNOSIS — I70219 Atherosclerosis of native arteries of extremities with intermittent claudication, unspecified extremity: Secondary | ICD-10-CM | POA: Diagnosis not present

## 2012-04-05 DIAGNOSIS — I739 Peripheral vascular disease, unspecified: Secondary | ICD-10-CM | POA: Diagnosis not present

## 2012-04-08 DIAGNOSIS — E119 Type 2 diabetes mellitus without complications: Secondary | ICD-10-CM | POA: Diagnosis not present

## 2012-04-08 DIAGNOSIS — L608 Other nail disorders: Secondary | ICD-10-CM | POA: Diagnosis not present

## 2012-04-19 DIAGNOSIS — Z23 Encounter for immunization: Secondary | ICD-10-CM | POA: Diagnosis not present

## 2012-05-03 DIAGNOSIS — H35059 Retinal neovascularization, unspecified, unspecified eye: Secondary | ICD-10-CM | POA: Diagnosis not present

## 2012-05-03 DIAGNOSIS — H353290 Exudative age-related macular degeneration, unspecified eye, stage unspecified: Secondary | ICD-10-CM | POA: Diagnosis not present

## 2012-06-07 DIAGNOSIS — H35059 Retinal neovascularization, unspecified, unspecified eye: Secondary | ICD-10-CM | POA: Diagnosis not present

## 2012-06-07 DIAGNOSIS — M1990 Unspecified osteoarthritis, unspecified site: Secondary | ICD-10-CM | POA: Diagnosis not present

## 2012-06-07 DIAGNOSIS — H353290 Exudative age-related macular degeneration, unspecified eye, stage unspecified: Secondary | ICD-10-CM | POA: Diagnosis not present

## 2012-06-07 DIAGNOSIS — H3570 Unspecified separation of retinal layers: Secondary | ICD-10-CM | POA: Diagnosis not present

## 2012-06-07 DIAGNOSIS — E119 Type 2 diabetes mellitus without complications: Secondary | ICD-10-CM | POA: Diagnosis not present

## 2012-06-07 DIAGNOSIS — E785 Hyperlipidemia, unspecified: Secondary | ICD-10-CM | POA: Diagnosis not present

## 2012-06-07 DIAGNOSIS — I10 Essential (primary) hypertension: Secondary | ICD-10-CM | POA: Diagnosis not present

## 2012-06-07 DIAGNOSIS — H35429 Microcystoid degeneration of retina, unspecified eye: Secondary | ICD-10-CM | POA: Diagnosis not present

## 2012-06-09 DIAGNOSIS — E119 Type 2 diabetes mellitus without complications: Secondary | ICD-10-CM | POA: Diagnosis not present

## 2012-06-09 DIAGNOSIS — E782 Mixed hyperlipidemia: Secondary | ICD-10-CM | POA: Diagnosis not present

## 2012-06-09 DIAGNOSIS — I739 Peripheral vascular disease, unspecified: Secondary | ICD-10-CM | POA: Diagnosis not present

## 2012-06-09 DIAGNOSIS — I10 Essential (primary) hypertension: Secondary | ICD-10-CM | POA: Diagnosis not present

## 2012-06-20 DIAGNOSIS — H353190 Nonexudative age-related macular degeneration, unspecified eye, stage unspecified: Secondary | ICD-10-CM | POA: Diagnosis not present

## 2012-06-20 DIAGNOSIS — E119 Type 2 diabetes mellitus without complications: Secondary | ICD-10-CM | POA: Diagnosis not present

## 2012-06-20 DIAGNOSIS — Z961 Presence of intraocular lens: Secondary | ICD-10-CM | POA: Diagnosis not present

## 2012-06-24 DIAGNOSIS — L608 Other nail disorders: Secondary | ICD-10-CM | POA: Diagnosis not present

## 2012-06-24 DIAGNOSIS — E119 Type 2 diabetes mellitus without complications: Secondary | ICD-10-CM | POA: Diagnosis not present

## 2012-07-20 DIAGNOSIS — H35059 Retinal neovascularization, unspecified, unspecified eye: Secondary | ICD-10-CM | POA: Diagnosis not present

## 2012-07-20 DIAGNOSIS — H353290 Exudative age-related macular degeneration, unspecified eye, stage unspecified: Secondary | ICD-10-CM | POA: Diagnosis not present

## 2012-08-29 DIAGNOSIS — H353290 Exudative age-related macular degeneration, unspecified eye, stage unspecified: Secondary | ICD-10-CM | POA: Diagnosis not present

## 2012-08-29 DIAGNOSIS — H35359 Cystoid macular degeneration, unspecified eye: Secondary | ICD-10-CM | POA: Diagnosis not present

## 2012-10-12 DIAGNOSIS — H35059 Retinal neovascularization, unspecified, unspecified eye: Secondary | ICD-10-CM | POA: Diagnosis not present

## 2012-10-12 DIAGNOSIS — H353290 Exudative age-related macular degeneration, unspecified eye, stage unspecified: Secondary | ICD-10-CM | POA: Diagnosis not present

## 2012-11-22 DIAGNOSIS — L608 Other nail disorders: Secondary | ICD-10-CM | POA: Diagnosis not present

## 2012-11-22 DIAGNOSIS — L84 Corns and callosities: Secondary | ICD-10-CM | POA: Diagnosis not present

## 2012-11-22 DIAGNOSIS — E1159 Type 2 diabetes mellitus with other circulatory complications: Secondary | ICD-10-CM | POA: Diagnosis not present

## 2012-11-24 DIAGNOSIS — I2510 Atherosclerotic heart disease of native coronary artery without angina pectoris: Secondary | ICD-10-CM | POA: Diagnosis not present

## 2012-11-24 DIAGNOSIS — E785 Hyperlipidemia, unspecified: Secondary | ICD-10-CM | POA: Diagnosis not present

## 2012-11-24 DIAGNOSIS — E119 Type 2 diabetes mellitus without complications: Secondary | ICD-10-CM | POA: Diagnosis not present

## 2012-11-24 DIAGNOSIS — I10 Essential (primary) hypertension: Secondary | ICD-10-CM | POA: Diagnosis not present

## 2012-11-24 DIAGNOSIS — Z125 Encounter for screening for malignant neoplasm of prostate: Secondary | ICD-10-CM | POA: Diagnosis not present

## 2012-11-24 DIAGNOSIS — I739 Peripheral vascular disease, unspecified: Secondary | ICD-10-CM | POA: Diagnosis not present

## 2012-11-28 DIAGNOSIS — I10 Essential (primary) hypertension: Secondary | ICD-10-CM | POA: Diagnosis not present

## 2012-11-28 DIAGNOSIS — Z125 Encounter for screening for malignant neoplasm of prostate: Secondary | ICD-10-CM | POA: Diagnosis not present

## 2012-11-28 DIAGNOSIS — K279 Peptic ulcer, site unspecified, unspecified as acute or chronic, without hemorrhage or perforation: Secondary | ICD-10-CM | POA: Diagnosis not present

## 2012-11-28 DIAGNOSIS — E119 Type 2 diabetes mellitus without complications: Secondary | ICD-10-CM | POA: Diagnosis not present

## 2012-11-28 DIAGNOSIS — Z1331 Encounter for screening for depression: Secondary | ICD-10-CM | POA: Diagnosis not present

## 2012-11-28 DIAGNOSIS — I2510 Atherosclerotic heart disease of native coronary artery without angina pectoris: Secondary | ICD-10-CM | POA: Diagnosis not present

## 2012-11-28 DIAGNOSIS — I739 Peripheral vascular disease, unspecified: Secondary | ICD-10-CM | POA: Diagnosis not present

## 2012-11-28 DIAGNOSIS — M1990 Unspecified osteoarthritis, unspecified site: Secondary | ICD-10-CM | POA: Diagnosis not present

## 2012-11-28 DIAGNOSIS — Z0000 Encounter for general adult medical examination without abnormal findings: Secondary | ICD-10-CM | POA: Diagnosis not present

## 2012-11-28 DIAGNOSIS — E785 Hyperlipidemia, unspecified: Secondary | ICD-10-CM | POA: Diagnosis not present

## 2012-12-01 DIAGNOSIS — Z1212 Encounter for screening for malignant neoplasm of rectum: Secondary | ICD-10-CM | POA: Diagnosis not present

## 2012-12-05 ENCOUNTER — Other Ambulatory Visit: Payer: Self-pay | Admitting: Cardiovascular Disease

## 2012-12-07 DIAGNOSIS — H35059 Retinal neovascularization, unspecified, unspecified eye: Secondary | ICD-10-CM | POA: Diagnosis not present

## 2012-12-07 DIAGNOSIS — H353290 Exudative age-related macular degeneration, unspecified eye, stage unspecified: Secondary | ICD-10-CM | POA: Diagnosis not present

## 2012-12-13 ENCOUNTER — Encounter: Payer: Self-pay | Admitting: Cardiovascular Disease

## 2012-12-14 ENCOUNTER — Encounter: Payer: Self-pay | Admitting: Cardiovascular Disease

## 2012-12-14 ENCOUNTER — Ambulatory Visit (INDEPENDENT_AMBULATORY_CARE_PROVIDER_SITE_OTHER): Payer: Medicare Other | Admitting: Cardiovascular Disease

## 2012-12-14 VITALS — BP 102/56 | HR 103 | Ht 65.0 in | Wt 163.9 lb

## 2012-12-14 DIAGNOSIS — E119 Type 2 diabetes mellitus without complications: Secondary | ICD-10-CM | POA: Insufficient documentation

## 2012-12-14 DIAGNOSIS — E785 Hyperlipidemia, unspecified: Secondary | ICD-10-CM | POA: Diagnosis not present

## 2012-12-14 DIAGNOSIS — I10 Essential (primary) hypertension: Secondary | ICD-10-CM | POA: Diagnosis not present

## 2012-12-14 DIAGNOSIS — I739 Peripheral vascular disease, unspecified: Secondary | ICD-10-CM | POA: Diagnosis not present

## 2012-12-14 DIAGNOSIS — I2510 Atherosclerotic heart disease of native coronary artery without angina pectoris: Secondary | ICD-10-CM | POA: Diagnosis not present

## 2012-12-14 NOTE — Assessment & Plan Note (Signed)
On statin therapy followed by his primary care physician 

## 2012-12-14 NOTE — Assessment & Plan Note (Signed)
Status post left common iliac artery stenting remotely in [place]. I intermittent him 05/12/10 revealing an intact left common iliac artery stent with high-grade left external iliac artery stenosis which I stented with a 7 mm x 3 cm Nitinol self expanding stent. I also put 2 stents in his right SFA. He had an occluded left SFA.his left Dopplers in our office performed 04/05/12 revealed right ABI 0.75 with patent stents, a left ABI 0.58 with patent iliac stents and an known occluded left SFA. He says over the last 6 months his claudication has somewhat worsened. We'll get followup or extremity arterial Dopplers on him in October

## 2012-12-14 NOTE — Patient Instructions (Signed)
  We will see you back in follow up in 6 months with an extender and 12 months with Dr Allyson Sabal  Dr Allyson Sabal has ordered lower extremity dopplers to be done in October

## 2012-12-14 NOTE — Progress Notes (Signed)
12/14/2012 Christian Bailey   01/29/1941  [id number]  Primary Physician Christian Sauer, MD Primary Cardiologist: Christian Gess MD Christian Bailey   HPI:  The patient is a very pleasant 72 year old, married Caucasian male, father of 2, grandfather to 1 grandchild who I last saw 6 months ago. His daughter-in-law is a primary care physician at [employer]. He has a history of PVOD and stenting of his left common iliac artery 20 years ago. He was smoking 3 packs a day at that time but stopped smoking 20 years ago.   His other problems include hypertension, hyperlipidemia and noninsulin-requiring diabetes. He had a negative Myoview in our office August 18, 2010. Because of claudication, I performed angiograph on him May 12, 2010, revealing an intact left common iliac artery stent, high-grade left external iliac artery stenosis which I stented using a 7 mm x 3 cm long Nitinol self-expanding stent. I also put 2 stents in his right SFA. Followup Dopplers showed improvement but symptomatically he did not really improve much. He still complains of predictable bilateral lower extremity claudication at several hundred yards. His Dopplers have remained stable. Dr. Felipa Bailey follows his lipid profile. He denies chest pain or shortness of breath.  Since I last saw him 6 months ago he says his claudication has somewhat worsened. Worse before he was able to walk 30 minutes on a treadmill now he can walk 20.     Current Outpatient Prescriptions  Medication Sig Dispense Refill  . cilostazol (PLETAL) 50 MG tablet Take 50 mg by mouth 2 (two) times daily.      . clopidogrel (PLAVIX) 75 MG tablet TAKE 1 TABLET EVERY DAY  90 tablet  0  . co-enzyme Q-10 30 MG capsule Take 30 mg by mouth daily.      [name] esomeprazole (NEXIUM) 40 MG capsule Take 40 mg by mouth daily before breakfast.      . glucosamine-chondroitin 500-400 MG tablet Take 1 tablet by mouth 2 (two) times daily.      [name] lisinopril (PRINIVIL,ZESTRIL) 5  MG tablet Take 5 mg by mouth 2 (two) times daily.       . metFORMIN (GLUCOPHAGE) 500 MG tablet Take 1,000 mg by mouth 2 (two) times daily with a meal.       . metoprolol succinate (TOPROL-XL) 25 MG 24 hr tablet Take 25 mg by mouth 2 (two) times daily.       . rosuvastatin (CRESTOR) 20 MG tablet Take 20 mg by mouth daily.       No current facility-administered medications for this visit.    No Known Allergies  History   Social History  . Marital Status: Married    Spouse Name: N/A    Number of Children: 2  . Years of Education: N/A   Occupational History  . retired   .     Social History Main Topics  . Smoking status: Former Games developer  . Smokeless tobacco: Never Used  . Alcohol Use: Yes     Comment: rarely  . Drug Use: No  . Sexually Active: Not on file   Other Topics Concern  . Not on file   Social History Narrative  . No narrative on file     Review of Systems: General: negative for chills, fever, night sweats or weight changes.  Cardiovascular: negative for chest pain, dyspnea on exertion, edema, orthopnea, palpitations, paroxysmal nocturnal dyspnea or shortness of breath Dermatological: negative for rash Respiratory: negative for cough or wheezing Urologic: negative for hematuria  Abdominal: negative for nausea, vomiting, diarrhea, bright red blood per rectum, melena, or hematemesis Neurologic: negative for visual changes, syncope, or dizziness All other systems reviewed and are otherwise negative except as noted above.    Blood pressure 102/56, pulse 103, height 5\' 5"  (1.651 m), weight 163 lb 14.4 oz (74.345 kg).  General appearance: alert and no distress Neck: no adenopathy, no carotid bruit, no JVD, supple, symmetrical, trachea midline and thyroid not enlarged, symmetric, no tenderness/mass/nodules Lungs: clear to auscultation bilaterally Heart: regular rate and rhythm, S1, S2 normal, no murmur, click, rub or gallop Extremities: extremities normal, atraumatic,  no cyanosis or edema and soft bilateral femoral bruits  EKG sinus tachycardia at 103 with right bundle branch block unchanged from his prior EKG  ASSESSMENT AND PLAN:   Essential hypertension Under good control on appropriate medications  Hyperlipidemia On statin therapy followed by his primary care physician  Claudication Status post left common iliac artery stenting remotely in [place]. I intermittent him 05/12/10 revealing an intact left common iliac artery stent with high-grade left external iliac artery stenosis which I stented with a 7 mm x 3 cm Nitinol self expanding stent. I also put 2 stents in his right SFA. He had an occluded left SFA.his left Dopplers in our office performed 04/05/12 revealed right ABI 0.75 with patent stents, a left ABI 0.58 with patent iliac stents and an known occluded left SFA. He says over the last 6 months his claudication has somewhat worsened. We'll get followup or extremity arterial Dopplers on him in October      Christian Gess MD [facility], [facility] 12/14/2012 10:51 AM

## 2012-12-14 NOTE — Assessment & Plan Note (Signed)
Under good control on appropriate medications

## 2013-01-24 DIAGNOSIS — L608 Other nail disorders: Secondary | ICD-10-CM | POA: Diagnosis not present

## 2013-01-24 DIAGNOSIS — E1159 Type 2 diabetes mellitus with other circulatory complications: Secondary | ICD-10-CM | POA: Diagnosis not present

## 2013-02-16 DIAGNOSIS — H35059 Retinal neovascularization, unspecified, unspecified eye: Secondary | ICD-10-CM | POA: Diagnosis not present

## 2013-02-16 DIAGNOSIS — H3570 Unspecified separation of retinal layers: Secondary | ICD-10-CM | POA: Diagnosis not present

## 2013-02-16 DIAGNOSIS — H353290 Exudative age-related macular degeneration, unspecified eye, stage unspecified: Secondary | ICD-10-CM | POA: Diagnosis not present

## 2013-02-16 DIAGNOSIS — H33109 Unspecified retinoschisis, unspecified eye: Secondary | ICD-10-CM | POA: Diagnosis not present

## 2013-02-22 DIAGNOSIS — H35059 Retinal neovascularization, unspecified, unspecified eye: Secondary | ICD-10-CM | POA: Diagnosis not present

## 2013-02-22 DIAGNOSIS — H353290 Exudative age-related macular degeneration, unspecified eye, stage unspecified: Secondary | ICD-10-CM | POA: Diagnosis not present

## 2013-03-09 ENCOUNTER — Other Ambulatory Visit: Payer: Self-pay | Admitting: Cardiovascular Disease

## 2013-03-09 NOTE — Telephone Encounter (Signed)
Rx was sent to pharmacy electronically. 

## 2013-03-29 DIAGNOSIS — H353290 Exudative age-related macular degeneration, unspecified eye, stage unspecified: Secondary | ICD-10-CM | POA: Diagnosis not present

## 2013-03-29 DIAGNOSIS — H35059 Retinal neovascularization, unspecified, unspecified eye: Secondary | ICD-10-CM | POA: Diagnosis not present

## 2013-03-30 ENCOUNTER — Encounter (HOSPITAL_COMMUNITY): Payer: Self-pay | Admitting: *Deleted

## 2013-04-20 ENCOUNTER — Ambulatory Visit (HOSPITAL_COMMUNITY)
Admission: RE | Admit: 2013-04-20 | Discharge: 2013-04-20 | Disposition: A | Discharge status: Home / Self Care | Payer: Medicare Other | Source: Ambulatory Visit | Attending: Cardiology | Admitting: Cardiology

## 2013-04-20 DIAGNOSIS — I739 Peripheral vascular disease, unspecified: Secondary | ICD-10-CM | POA: Diagnosis not present

## 2013-04-20 NOTE — Progress Notes (Signed)
Lower Extremity Arterial Duplex Completed. °Brianna L Mazza,RVT °

## 2013-04-25 ENCOUNTER — Encounter: Payer: Self-pay | Admitting: *Deleted

## 2013-04-25 ENCOUNTER — Telehealth: Payer: Self-pay | Admitting: *Deleted

## 2013-04-25 DIAGNOSIS — I739 Peripheral vascular disease, unspecified: Secondary | ICD-10-CM

## 2013-04-25 NOTE — Telephone Encounter (Signed)
Order placed for repeat lower extremity dopplers in 1 year 

## 2013-04-25 NOTE — Telephone Encounter (Signed)
Message copied by Marella Bile on Tue Apr 25, 2013  4:40 PM ------      Message from: Runell Gess      Created: Sat Apr 22, 2013  5:01 PM       No change from prior study. Repeat in 12 months. ------

## 2013-05-03 DIAGNOSIS — H353290 Exudative age-related macular degeneration, unspecified eye, stage unspecified: Secondary | ICD-10-CM | POA: Diagnosis not present

## 2013-05-03 DIAGNOSIS — H35059 Retinal neovascularization, unspecified, unspecified eye: Secondary | ICD-10-CM | POA: Diagnosis not present

## 2013-06-12 ENCOUNTER — Encounter: Payer: Self-pay | Admitting: Cardiology

## 2013-06-12 ENCOUNTER — Ambulatory Visit (INDEPENDENT_AMBULATORY_CARE_PROVIDER_SITE_OTHER): Payer: Medicare Other | Admitting: Cardiology

## 2013-06-12 VITALS — BP 130/68 | HR 88 | Ht 65.0 in | Wt 166.0 lb

## 2013-06-12 DIAGNOSIS — E785 Hyperlipidemia, unspecified: Secondary | ICD-10-CM

## 2013-06-12 DIAGNOSIS — I452 Bifascicular block: Secondary | ICD-10-CM | POA: Diagnosis not present

## 2013-06-12 DIAGNOSIS — I739 Peripheral vascular disease, unspecified: Secondary | ICD-10-CM | POA: Diagnosis not present

## 2013-06-12 DIAGNOSIS — I10 Essential (primary) hypertension: Secondary | ICD-10-CM

## 2013-06-12 DIAGNOSIS — E119 Type 2 diabetes mellitus without complications: Secondary | ICD-10-CM

## 2013-06-12 MED ORDER — OMEGA-3-ACID ETHYL ESTERS 1 G PO CAPS: 1.0000 g | ORAL_CAPSULE | Freq: Every day | ORAL | Status: DC

## 2013-06-12 MED ORDER — CILOSTAZOL 50 MG PO TABS: 100.0000 mg | ORAL_TABLET | Freq: Two times a day (BID) | ORAL | Status: DC

## 2013-06-12 NOTE — Assessment & Plan Note (Signed)
No change in EKG

## 2013-06-12 NOTE — Assessment & Plan Note (Signed)
ABIs done in 03/2013 Lt 0.67, Rt 0.81 no change  Pt will follow up with Dr. Allyson Sabal in 6 months though he will call if further problems.

## 2013-06-12 NOTE — Progress Notes (Signed)
06/12/2013   PCP: Hoyle Sauer, MD   Chief Complaint  Patient presents with  . Follow-up    6 mo visit    Primary Cardiologist: Dr. Allyson Sabal  HPI:  72 year old, married Caucasian male, father of 2, grandfather to 1 grandchild who is followed by Dr. Allyson Sabal. His daughter-in-law is a primary care physician at [employer]. He has a history of PVOD and stenting of his left common iliac artery 20 years ago. He was smoking 3 packs a day at that time but stopped smoking 20 years ago.   He has no Known CAD.  His other problems include hypertension, hyperlipidemia and noninsulin-requiring diabetes. He had a negative Myoview in our office August 18, 2010.   Because of claudication, Dr. Allyson Sabal performed angiograph on him May 12, 2010, revealing an intact left common iliac artery stent, high-grade left external iliac artery stenosis which I stented using a 7 mm x 3 cm long Nitinol self-expanding stent. I also put 2 stents in his right SFA. Followup Dopplers showed improvement but symptomatically he did not really improve much. He still complains of predictable bilateral lower extremity claudication at several hundred yards. His Dopplers have remained stable. Dr. Felipa Eth follows his lipid profile. He denies chest pain or shortness of breath.  Today he states his claudication has somewhat worsened. Worse before he was able to walk 30 minutes on a treadmill now he can walk 20. We discussed increasing his Pletal to see if this will help.  Also his HDL is low so he will try fish oil.     No Known Allergies  Current Outpatient Prescriptions  Medication Sig Dispense Refill  . clopidogrel (PLAVIX) 75 MG tablet TAKE 1 TABLET DAILY  90 tablet  2  . co-enzyme Q-10 30 MG capsule Take 30 mg by mouth daily.      [name] esomeprazole (NEXIUM) 40 MG capsule Take 40 mg by mouth daily before breakfast.      . glucosamine-chondroitin 500-400 MG tablet Take 1 tablet by mouth 2 (two) times daily.      [name]  lisinopril (PRINIVIL,ZESTRIL) 5 MG tablet Take 5 mg by mouth 2 (two) times daily.       . metFORMIN (GLUCOPHAGE) 500 MG tablet Take 1,000 mg by mouth 2 (two) times daily with a meal.       . metoprolol succinate (TOPROL-XL) 25 MG 24 hr tablet Take 25 mg by mouth 2 (two) times daily.       . rosuvastatin (CRESTOR) 20 MG tablet Take 20 mg by mouth daily.      . cilostazol (PLETAL) 50 MG tablet Take 2 tablets (100 mg total) by mouth 2 (two) times daily.  60 tablet  1  . omega-3 acid ethyl esters (LOVAZA) 1 G capsule Take 1 capsule (1 g total) by mouth daily.  60 capsule  6   No current facility-administered medications for this visit.    Past Medical History  Diagnosis Date  . Osteoarthritis   . Hyperlipemia   . GI bleed   . Anemia   . CAD (coronary artery disease)   . Diabetes mellitus   . Hypertension   . History of colon polyps   . Claudication     LEA DOPPLER, 04/05/2012 - BILATERAL ABIs-moderate arterial insufficiency; RIGHT SFA STENT(S)-50-69% diameter reduction; LEFT SFA-occluded at proximal to mid level; LEFT RUNOFF-posterior tibial artery demonstrated occlusive disease  . RBBB (right bundle branch block)     NUCLEAR STRESS  TEST, 08/18/2010 - no significant wall motion abnoramlities noted, post-stress EF 69%, normal myocardial perfusion study  . SOBOE (shortness of breath on exertion)     2D ECHO, 10/04/2007 - EF >55%, normal-mild  . PAD (peripheral artery disease), hx remote stent Lt common iliac, 04/2010-stent to lt common iliac & 2 stents to the Rt SFA 06/12/2013  . RBBB (right bundle branch block with left anterior fascicular block) 06/12/2013    Past Surgical History  Procedure Laterality Date  . Knee arthroscopy      left  . Angioplasty / stenting femoral    . Angioplasty / stenting iliac    . Rotator cuff repair Right 2003  . Femoral artery stent Right 05/12/2010    Stented distally with a 6x100 Abbott absolute stent and proximally with a 6x60 Cook Zilver stent resulting  in the reduction of the proximal segment 80% and mid segment 60-70% to 0% residual, LEFT common femoral artery stented with a 7x3 Smart stent resulting in reduction of 90% stenosis to 0% residual    WUJ:WJXBJYN:WG colds or fevers, no weight changes, exercises 2-3 X week at the gym, eats healthy Skin:no rashes or ulcers HEENT:no blurred vision, no congestion CV:see HPI PUL:see HPI GI:no diarrhea constipation or melena- though hemorrhoids so occ bright blood, no indigestion GU:no hematuria, no dysuria MS:no joint pain, + claudication continues and maybe mildly increased Neuro:no syncope, no lightheadedness Endo:no diabetes, no thyroid disease  PHYSICAL EXAM BP 130/68  Pulse 88  Ht 5\' 5"  (1.651 m)  Wt 166 lb (75.297 kg)  BMI 27.62 kg/m2 General:Pleasant affect, NAD Skin:Warm and dry, brisk capillary refill HEENT:normocephalic, sclera clear, mucus membranes moist Neck:supple, no JVD, no bruits  Heart:S1S2 RRR without murmur, gallup, rub or click Lungs:clear without rales, rhonchi, or wheezes NFA:OZHY, non tender, + BS, do not palpate liver spleen or masses Ext:no lower ext edema,  2+ radial pulses Neuro:alert and oriented, MAE, follows commands, + facial symmetry  EKG:SR with RBBB & LAFB chronic HR 96 no acute changes otherwise.  ASSESSMENT AND PLAN Claudication Some increase in claudication, will increase Pletal to 100 mg BID if no improvement he will decrease back to 50 mg BID.  If improvement we will send new prescription for 100 mg tab BID.  PAD (peripheral artery disease), hx remote stent Lt common iliac, 04/2010-stent to lt common iliac & 2 stents to the Rt SFA ABIs done in 03/2013 Lt 0.67, Rt 0.81 no change  Pt will follow up with Dr. Allyson Sabal in 6 months though he will call if further problems.  Hyperlipidemia Last HDL of 30 he is agreeable to try Lovaza 1 gm twice a day to improve HDL, also will increase his gym visits.  Last LDL 83.  No Hx CAD, neg nuc.  RBBB (right  bundle branch block with left anterior fascicular block) No change in EKG  Type 2 diabetes mellitus Followed by PCP.

## 2013-06-12 NOTE — Assessment & Plan Note (Signed)
Last HDL of 30 he is agreeable to try Lovaza 1 gm twice a day to improve HDL, also will increase his gym visits.  Last LDL 83.  No Hx CAD, neg nuc.

## 2013-06-12 NOTE — Assessment & Plan Note (Signed)
Followed by PCP

## 2013-06-12 NOTE — Assessment & Plan Note (Signed)
Some increase in claudication, will increase Pletal to 100 mg BID if no improvement he will decrease back to 50 mg BID.  If improvement we will send new prescription for 100 mg tab BID.

## 2013-06-12 NOTE — Patient Instructions (Signed)
Increase your Pletal to 2 of 50 mg tabs twice a day if improvement in legs let us know and we will change to 100 mg tablet daily, if no improvement call and we will just continue 50 mg twice a day.  I added Lovaza 1 gm twice a day -fish oil- to help raise your good cholesterol.  Call if any problems.  Follow up with Dr. Allyson Sabal in 6 months.

## 2013-06-16 ENCOUNTER — Telehealth: Payer: Self-pay | Admitting: Cardiovascular Disease

## 2013-06-16 MED ORDER — OMEGA-3-ACID ETHYL ESTERS 1 G PO CAPS: 1.0000 g | ORAL_CAPSULE | Freq: Two times a day (BID) | ORAL | Status: DC

## 2013-06-16 MED ORDER — CILOSTAZOL 50 MG PO TABS: 100.0000 mg | ORAL_TABLET | Freq: Two times a day (BID) | ORAL | Status: DC

## 2013-06-16 NOTE — Telephone Encounter (Signed)
Returned call and pt verified x 2.  Pt stated he received a message from [facility] that Rxs not sent for 90-days.  Needs Rxs resent for 90-days.  Pt informed Rxs sent.  Also stated he received another message that Lovaza needs authorization.  Pt informed nurse will be notified.  Pt verbalized understanding and agreed w/ plan.  Pt stated he is not out of medication.  Message forwarded to Samara Deist, RN for PA for Lovaza.

## 2013-06-16 NOTE — Telephone Encounter (Signed)
Having a problem getting refills  [employer] [facility] having problem with RX  Please call

## 2013-06-16 NOTE — Telephone Encounter (Signed)
Returned call.  Left message to call back before 4pm and leave message with names of meds having problems getting refills.

## 2013-06-16 NOTE — Telephone Encounter (Signed)
Returning your call. °

## 2013-06-28 NOTE — Telephone Encounter (Signed)
Can you please investigate this for me?

## 2013-06-28 NOTE — Telephone Encounter (Signed)
Lovaza 1mg  BID prescribed 06/21/13.  [employer] has denied coverage.  Criteria for Lovaza is TG > 500 mg/dl.  Note states it was started for HDL of 30 and last TG was < 200.  What do you want [place] to do?

## 2013-06-28 NOTE — Telephone Encounter (Signed)
He could take fish oil over the counter to improve his HDL, his insurance does not wish to pay for fish oil for HDL.  That is up to him but he would benefit from the fish oil.  If he doesn't want to pay that is up to him.

## 2013-07-03 ENCOUNTER — Telehealth: Payer: Self-pay | Admitting: *Deleted

## 2013-07-03 MED ORDER — FISH OIL 1000 MG PO CAPS: 1000.0000 mg | ORAL_CAPSULE | Freq: Every day | ORAL | Status: DC

## 2013-07-03 NOTE — Telephone Encounter (Signed)
Patient notified to substitute Lovaza w/over the counter Fish oil.  Voiced understanding.

## 2013-07-03 NOTE — Telephone Encounter (Signed)
Insurance will not cover Lovaza - patient will switch to OTC Fish oil.  Voiced understanding.

## 2013-07-10 ENCOUNTER — Other Ambulatory Visit: Payer: Self-pay | Admitting: *Deleted

## 2013-07-10 MED ORDER — CILOSTAZOL 50 MG PO TABS: 100.0000 mg | ORAL_TABLET | Freq: Two times a day (BID) | ORAL | Status: DC

## 2013-07-11 DIAGNOSIS — H35059 Retinal neovascularization, unspecified, unspecified eye: Secondary | ICD-10-CM | POA: Diagnosis not present

## 2013-07-11 DIAGNOSIS — H353290 Exudative age-related macular degeneration, unspecified eye, stage unspecified: Secondary | ICD-10-CM | POA: Diagnosis not present

## 2013-07-14 ENCOUNTER — Other Ambulatory Visit: Payer: Self-pay | Admitting: *Deleted

## 2013-07-14 MED ORDER — CILOSTAZOL 50 MG PO TABS: 100.0000 mg | ORAL_TABLET | Freq: Two times a day (BID) | ORAL | Status: DC

## 2013-07-28 DIAGNOSIS — J069 Acute upper respiratory infection, unspecified: Secondary | ICD-10-CM | POA: Diagnosis not present

## 2013-07-28 DIAGNOSIS — Z6826 Body mass index (BMI) 26.0-26.9, adult: Secondary | ICD-10-CM | POA: Diagnosis not present

## 2013-07-28 DIAGNOSIS — I10 Essential (primary) hypertension: Secondary | ICD-10-CM | POA: Diagnosis not present

## 2013-07-28 DIAGNOSIS — E1159 Type 2 diabetes mellitus with other circulatory complications: Secondary | ICD-10-CM | POA: Diagnosis not present

## 2013-08-01 ENCOUNTER — Telehealth: Payer: Self-pay | Admitting: *Deleted

## 2013-08-01 NOTE — Telephone Encounter (Signed)
Patient is returning Amber's call

## 2013-08-01 NOTE — Telephone Encounter (Signed)
Message forwarded to W. Waddell, CMA.  

## 2013-08-01 NOTE — Telephone Encounter (Signed)
Pt was calling in regards to his cilostazol. Pt needs a refill on it to be sent to [facility] for a 90 day supply. He stated that they have not heard back from [place]. They need a PA for this medication.   JB

## 2013-08-01 NOTE — Telephone Encounter (Signed)
Returned call and pt verified x 2 w/ pt's wife, [place].  Informed pt just left and she isn't sure of call.  Asked her to have pt call back.  Agreed to give pt the message.

## 2013-08-02 ENCOUNTER — Telehealth: Payer: Self-pay

## 2013-08-02 NOTE — Telephone Encounter (Signed)
Pt needs a PA for cilostazol.  Please contact him for details, unless you have already done this.

## 2013-08-02 NOTE — Telephone Encounter (Signed)
Patient is returning your call. Please call back at [phone].

## 2013-08-03 ENCOUNTER — Other Ambulatory Visit: Payer: Self-pay | Admitting: *Deleted

## 2013-08-03 MED ORDER — CILOSTAZOL 50 MG PO TABS: 100.0000 mg | ORAL_TABLET | Freq: Two times a day (BID) | ORAL | Status: DC

## 2013-08-03 NOTE — Telephone Encounter (Signed)
Rx was sent to pharmacy electronically. 

## 2013-08-08 ENCOUNTER — Other Ambulatory Visit: Payer: Self-pay | Admitting: *Deleted

## 2013-08-08 MED ORDER — CILOSTAZOL 100 MG PO TABS: 100.0000 mg | ORAL_TABLET | Freq: Two times a day (BID) | ORAL | Status: DC

## 2013-08-08 NOTE — Telephone Encounter (Signed)
I called patient and he said that everything was fine with the RX for Pletal.  He is taking the 100mg  twice a day.  I will send in updated RX.

## 2013-08-08 NOTE — Telephone Encounter (Signed)
I [facility] [phone] and they said that they do not need a PA.

## 2013-08-23 DIAGNOSIS — H353290 Exudative age-related macular degeneration, unspecified eye, stage unspecified: Secondary | ICD-10-CM | POA: Diagnosis not present

## 2013-08-23 DIAGNOSIS — H35359 Cystoid macular degeneration, unspecified eye: Secondary | ICD-10-CM | POA: Diagnosis not present

## 2013-08-23 DIAGNOSIS — H35059 Retinal neovascularization, unspecified, unspecified eye: Secondary | ICD-10-CM | POA: Diagnosis not present

## 2013-08-25 DIAGNOSIS — E1159 Type 2 diabetes mellitus with other circulatory complications: Secondary | ICD-10-CM | POA: Diagnosis not present

## 2013-08-25 DIAGNOSIS — L608 Other nail disorders: Secondary | ICD-10-CM | POA: Diagnosis not present

## 2013-08-28 DIAGNOSIS — H353290 Exudative age-related macular degeneration, unspecified eye, stage unspecified: Secondary | ICD-10-CM | POA: Diagnosis not present

## 2013-08-28 DIAGNOSIS — H35059 Retinal neovascularization, unspecified, unspecified eye: Secondary | ICD-10-CM | POA: Diagnosis not present

## 2013-09-27 DIAGNOSIS — H35059 Retinal neovascularization, unspecified, unspecified eye: Secondary | ICD-10-CM | POA: Diagnosis not present

## 2013-09-27 DIAGNOSIS — H353290 Exudative age-related macular degeneration, unspecified eye, stage unspecified: Secondary | ICD-10-CM | POA: Diagnosis not present

## 2013-10-04 DIAGNOSIS — H353290 Exudative age-related macular degeneration, unspecified eye, stage unspecified: Secondary | ICD-10-CM | POA: Diagnosis not present

## 2013-10-04 DIAGNOSIS — H35059 Retinal neovascularization, unspecified, unspecified eye: Secondary | ICD-10-CM | POA: Diagnosis not present

## 2013-10-27 ENCOUNTER — Other Ambulatory Visit: Payer: Self-pay | Admitting: Cardiovascular Disease

## 2013-10-27 NOTE — Telephone Encounter (Signed)
Rx refill sent to patient pharmacy   

## 2013-10-31 DIAGNOSIS — L608 Other nail disorders: Secondary | ICD-10-CM | POA: Diagnosis not present

## 2013-10-31 DIAGNOSIS — E1159 Type 2 diabetes mellitus with other circulatory complications: Secondary | ICD-10-CM | POA: Diagnosis not present

## 2013-10-31 DIAGNOSIS — D485 Neoplasm of uncertain behavior of skin: Secondary | ICD-10-CM | POA: Diagnosis not present

## 2013-11-01 DIAGNOSIS — H35059 Retinal neovascularization, unspecified, unspecified eye: Secondary | ICD-10-CM | POA: Diagnosis not present

## 2013-11-01 DIAGNOSIS — H353290 Exudative age-related macular degeneration, unspecified eye, stage unspecified: Secondary | ICD-10-CM | POA: Diagnosis not present

## 2013-11-01 DIAGNOSIS — H35359 Cystoid macular degeneration, unspecified eye: Secondary | ICD-10-CM | POA: Diagnosis not present

## 2013-11-08 DIAGNOSIS — H353290 Exudative age-related macular degeneration, unspecified eye, stage unspecified: Secondary | ICD-10-CM | POA: Diagnosis not present

## 2013-11-08 DIAGNOSIS — H35059 Retinal neovascularization, unspecified, unspecified eye: Secondary | ICD-10-CM | POA: Diagnosis not present

## 2013-11-27 DIAGNOSIS — I739 Peripheral vascular disease, unspecified: Secondary | ICD-10-CM | POA: Diagnosis not present

## 2013-11-27 DIAGNOSIS — I10 Essential (primary) hypertension: Secondary | ICD-10-CM | POA: Diagnosis not present

## 2013-11-27 DIAGNOSIS — I2510 Atherosclerotic heart disease of native coronary artery without angina pectoris: Secondary | ICD-10-CM | POA: Diagnosis not present

## 2013-11-27 DIAGNOSIS — E1159 Type 2 diabetes mellitus with other circulatory complications: Secondary | ICD-10-CM | POA: Diagnosis not present

## 2013-11-27 DIAGNOSIS — Z125 Encounter for screening for malignant neoplasm of prostate: Secondary | ICD-10-CM | POA: Diagnosis not present

## 2013-11-27 DIAGNOSIS — E785 Hyperlipidemia, unspecified: Secondary | ICD-10-CM | POA: Diagnosis not present

## 2013-12-01 DIAGNOSIS — Z125 Encounter for screening for malignant neoplasm of prostate: Secondary | ICD-10-CM | POA: Diagnosis not present

## 2013-12-01 DIAGNOSIS — E1159 Type 2 diabetes mellitus with other circulatory complications: Secondary | ICD-10-CM | POA: Diagnosis not present

## 2013-12-01 DIAGNOSIS — I2510 Atherosclerotic heart disease of native coronary artery without angina pectoris: Secondary | ICD-10-CM | POA: Diagnosis not present

## 2013-12-01 DIAGNOSIS — Z1331 Encounter for screening for depression: Secondary | ICD-10-CM | POA: Diagnosis not present

## 2013-12-01 DIAGNOSIS — D649 Anemia, unspecified: Secondary | ICD-10-CM | POA: Diagnosis not present

## 2013-12-01 DIAGNOSIS — I10 Essential (primary) hypertension: Secondary | ICD-10-CM | POA: Diagnosis not present

## 2013-12-01 DIAGNOSIS — K279 Peptic ulcer, site unspecified, unspecified as acute or chronic, without hemorrhage or perforation: Secondary | ICD-10-CM | POA: Diagnosis not present

## 2013-12-01 DIAGNOSIS — M1990 Unspecified osteoarthritis, unspecified site: Secondary | ICD-10-CM | POA: Diagnosis not present

## 2013-12-01 DIAGNOSIS — E785 Hyperlipidemia, unspecified: Secondary | ICD-10-CM | POA: Diagnosis not present

## 2013-12-01 DIAGNOSIS — Z0000 Encounter for general adult medical examination without abnormal findings: Secondary | ICD-10-CM | POA: Diagnosis not present

## 2013-12-06 DIAGNOSIS — H353290 Exudative age-related macular degeneration, unspecified eye, stage unspecified: Secondary | ICD-10-CM | POA: Diagnosis not present

## 2013-12-06 DIAGNOSIS — H35059 Retinal neovascularization, unspecified, unspecified eye: Secondary | ICD-10-CM | POA: Diagnosis not present

## 2013-12-12 ENCOUNTER — Encounter: Payer: Self-pay | Admitting: Cardiovascular Disease

## 2013-12-12 ENCOUNTER — Ambulatory Visit (INDEPENDENT_AMBULATORY_CARE_PROVIDER_SITE_OTHER): Payer: Medicare Other | Admitting: Cardiovascular Disease

## 2013-12-12 VITALS — BP 112/40 | HR 133 | Ht 65.0 in | Wt 163.2 lb

## 2013-12-12 DIAGNOSIS — I739 Peripheral vascular disease, unspecified: Secondary | ICD-10-CM | POA: Diagnosis not present

## 2013-12-12 DIAGNOSIS — I10 Essential (primary) hypertension: Secondary | ICD-10-CM | POA: Diagnosis not present

## 2013-12-12 DIAGNOSIS — R0989 Other specified symptoms and signs involving the circulatory and respiratory systems: Secondary | ICD-10-CM | POA: Diagnosis not present

## 2013-12-12 DIAGNOSIS — E785 Hyperlipidemia, unspecified: Secondary | ICD-10-CM | POA: Diagnosis not present

## 2013-12-12 NOTE — Assessment & Plan Note (Signed)
History of remote left common iliac artery stenting. I angiogram him in 05/12/10 revealing a patent left common iliac artery stents, total left SFA at the origin with reconstitution at [place]. I stented the left external iliac artery that time as well as the right SFA in 2 places. He continues to have Lescol limiting claudication. His protime was recently increased to 6 months ago by Cecilie Kicks reservist practitioner. Doppler performed back in October revealed a right ABI 21 with patent stents in the right SFA and a left ABI 0.67 with patent left common and external iliac artery stents and a known occluded left SFA. He says that the Pletal has not really affected his claudication symptoms.

## 2013-12-12 NOTE — Assessment & Plan Note (Signed)
On statin therapy with recent lipid profile performed by his PCP 11/27/13 revealed a total cholesterol of 153, LDL of 86 and HDL of 34

## 2013-12-12 NOTE — Patient Instructions (Signed)
  We will see you back in follow up in 6 months with an extender and 1 year with Dr Gwenlyn Found.   Dr Gwenlyn Found has ordered a Carotid Duplex (to be done in Oct)- This test is an ultrasound of the carotid arteries in your neck. It looks at blood flow through these arteries that supply the brain with blood. Allow one hour for this exam. There are no restrictions or special instructions.

## 2013-12-12 NOTE — Progress Notes (Signed)
12/12/2013 Christian Bailey   1941-05-20  [id number]  Primary Physician Tivis Ringer, MD Primary Cardiologist: Lorretta Harp MD [name]   HPI:  The patient is a very pleasant 73 year old, married Caucasian male, father of 2, grandfather to 1 grandchild who I last saw 12 months ago. He was seen 6 months ago by Cecilie Kicks registered nurse practitioner.His daughter-in-law is a primary care physician at [employer]. He has a history of PVOD and stenting of his left common iliac artery 20 years ago. He was smoking 3 packs a day at that time but stopped smoking 20 years ago.   His other problems include hypertension, hyperlipidemia and noninsulin-requiring diabetes. He had a negative Myoview in our office August 18, 2010. Because of claudication, I performed angiograph on him May 12, 2010, revealing an intact left common iliac artery stent, high-grade left external iliac artery stenosis which I stented using a 7 mm x 3 cm long Nitinol self-expanding stent. I also put 2 stents in his right SFA. Followup Dopplers showed improvement but symptomatically he did not really improve much. He still complains of predictable bilateral lower extremity claudication at several hundred yards. His Dopplers have remained stable. Dr. Dagmar Hait follows his lipid profile which was recently done earlier this month revealing a total cholesterol of 153, LDL of 86 and HDL of 34. Recent lower with Doppler studies performed in October revealed patent stents with a right ABI of 0.81 and a left of 0.67.    Current Outpatient Prescriptions  Medication Sig Dispense Refill  . cilostazol (PLETAL) 100 MG tablet Take 1 tablet (100 mg total) by mouth 2 (two) times daily.  180 tablet  3  . clopidogrel (PLAVIX) 75 MG tablet TAKE 1 TABLET DAILY  90 tablet  2  . co-enzyme Q-10 30 MG capsule Take 30 mg by mouth daily.      [name] esomeprazole (NEXIUM) 40 MG capsule Take 40 mg by mouth daily before breakfast.      .  glucosamine-chondroitin 500-400 MG tablet Take 1 tablet by mouth 2 (two) times daily.      [name] lisinopril (PRINIVIL,ZESTRIL) 5 MG tablet Take 5 mg by mouth 2 (two) times daily.       . metFORMIN (GLUCOPHAGE) 500 MG tablet Take 1,000 mg by mouth 2 (two) times daily with a meal.       . metoprolol succinate (TOPROL-XL) 25 MG 24 hr tablet Take 25 mg by mouth 2 (two) times daily.       . Omega-3 Fatty Acids (FISH OIL) 1000 MG CAPS Take 1 capsule (1,000 mg total) by mouth daily.    0  . rosuvastatin (CRESTOR) 20 MG tablet Take 20 mg by mouth daily.       No current facility-administered medications for this visit.    No Known Allergies  History   Social History  . Marital Status: Married    Spouse Name: N/A    Number of Children: 2  . Years of Education: N/A   Occupational History  . retired   .     Social History Main Topics  . Smoking status: Former Research scientist (life sciences)  . Smokeless tobacco: Never Used  . Alcohol Use: Yes     Comment: rarely  . Drug Use: No  . Sexual Activity: Not on file   Other Topics Concern  . Not on file   Social History Narrative  . No narrative on file     Review of Systems: General: negative for chills,  fever, night sweats or weight changes.  Cardiovascular: negative for chest pain, dyspnea on exertion, edema, orthopnea, palpitations, paroxysmal nocturnal dyspnea or shortness of breath Dermatological: negative for rash Respiratory: negative for cough or wheezing Urologic: negative for hematuria Abdominal: negative for nausea, vomiting, diarrhea, bright red blood per rectum, melena, or hematemesis Neurologic: negative for visual changes, syncope, or dizziness All other systems reviewed and are otherwise negative except as noted above.    Blood pressure 112/40, pulse 133, height 5\' 5"  (1.651 m), weight 163 lb 3.2 oz (74.027 kg).  General appearance: alert and no distress Neck: no adenopathy, no JVD, supple, symmetrical, trachea midline, thyroid not enlarged,  symmetric, no tenderness/mass/nodules and soft bilateral carotid bruits Lungs: clear to auscultation bilaterally Heart: regular rate and rhythm, S1, S2 normal, no murmur, click, rub or gallop Extremities: extremities normal, atraumatic, no cyanosis or edema  EKG sinus tachycardia at 133 with a bundle-branch block  ASSESSMENT AND PLAN:   PAD (peripheral artery disease), hx remote stent Lt common iliac, 04/2010-stent to lt common iliac & 2 stents to the Rt SFA History of remote left common iliac artery stenting. I angiogram him in 05/12/10 revealing a patent left common iliac artery stents, total left SFA at the origin with reconstitution at [place]. I stented the left external iliac artery that time as well as the right SFA in 2 places. He continues to have [place] limiting claudication. His protime was recently increased to 6 months ago by Cecilie Kicks reservist practitioner. Doppler performed back in October revealed a right ABI 21 with patent stents in the right SFA and a left ABI 0.67 with patent left common and external iliac artery stents and a known occluded left SFA. He says that the Pletal has not really affected his claudication symptoms.  Essential hypertension Controlled on current medications  Hyperlipidemia On statin therapy with recent lipid profile performed by his PCP 11/27/13 revealed a total cholesterol of 153, LDL of 86 and HDL of [place] MD [facility], [facility] 12/12/2013 3:42 PM

## 2013-12-12 NOTE — Assessment & Plan Note (Signed)
Controlled on current medications 

## 2013-12-13 ENCOUNTER — Telehealth (HOSPITAL_COMMUNITY): Payer: Self-pay | Admitting: *Deleted

## 2013-12-13 DIAGNOSIS — H35059 Retinal neovascularization, unspecified, unspecified eye: Secondary | ICD-10-CM | POA: Diagnosis not present

## 2013-12-13 DIAGNOSIS — H353290 Exudative age-related macular degeneration, unspecified eye, stage unspecified: Secondary | ICD-10-CM | POA: Diagnosis not present

## 2013-12-19 DIAGNOSIS — Z1212 Encounter for screening for malignant neoplasm of rectum: Secondary | ICD-10-CM | POA: Diagnosis not present

## 2014-01-16 DIAGNOSIS — L84 Corns and callosities: Secondary | ICD-10-CM | POA: Diagnosis not present

## 2014-01-16 DIAGNOSIS — L608 Other nail disorders: Secondary | ICD-10-CM | POA: Diagnosis not present

## 2014-01-16 DIAGNOSIS — Z1212 Encounter for screening for malignant neoplasm of rectum: Secondary | ICD-10-CM | POA: Diagnosis not present

## 2014-01-16 DIAGNOSIS — E1159 Type 2 diabetes mellitus with other circulatory complications: Secondary | ICD-10-CM | POA: Diagnosis not present

## 2014-01-18 DIAGNOSIS — H35059 Retinal neovascularization, unspecified, unspecified eye: Secondary | ICD-10-CM | POA: Diagnosis not present

## 2014-01-18 DIAGNOSIS — H35359 Cystoid macular degeneration, unspecified eye: Secondary | ICD-10-CM | POA: Diagnosis not present

## 2014-01-18 DIAGNOSIS — H353290 Exudative age-related macular degeneration, unspecified eye, stage unspecified: Secondary | ICD-10-CM | POA: Diagnosis not present

## 2014-01-24 DIAGNOSIS — H353290 Exudative age-related macular degeneration, unspecified eye, stage unspecified: Secondary | ICD-10-CM | POA: Diagnosis not present

## 2014-01-24 DIAGNOSIS — H35059 Retinal neovascularization, unspecified, unspecified eye: Secondary | ICD-10-CM | POA: Diagnosis not present

## 2014-03-08 DIAGNOSIS — H35059 Retinal neovascularization, unspecified, unspecified eye: Secondary | ICD-10-CM | POA: Diagnosis not present

## 2014-03-08 DIAGNOSIS — H353290 Exudative age-related macular degeneration, unspecified eye, stage unspecified: Secondary | ICD-10-CM | POA: Diagnosis not present

## 2014-03-14 DIAGNOSIS — H35059 Retinal neovascularization, unspecified, unspecified eye: Secondary | ICD-10-CM | POA: Diagnosis not present

## 2014-03-14 DIAGNOSIS — H353290 Exudative age-related macular degeneration, unspecified eye, stage unspecified: Secondary | ICD-10-CM | POA: Diagnosis not present

## 2014-03-20 DIAGNOSIS — E119 Type 2 diabetes mellitus without complications: Secondary | ICD-10-CM | POA: Diagnosis not present

## 2014-03-20 DIAGNOSIS — L608 Other nail disorders: Secondary | ICD-10-CM | POA: Diagnosis not present

## 2014-03-20 DIAGNOSIS — L84 Corns and callosities: Secondary | ICD-10-CM | POA: Diagnosis not present

## 2014-04-04 ENCOUNTER — Ambulatory Visit (HOSPITAL_BASED_OUTPATIENT_CLINIC_OR_DEPARTMENT_OTHER)
Admission: RE | Admit: 2014-04-04 | Discharge: 2014-04-04 | Disposition: A | Discharge status: Home / Self Care | Payer: Medicare Other | Source: Ambulatory Visit | Attending: Cardiovascular Disease | Admitting: Cardiovascular Disease

## 2014-04-04 ENCOUNTER — Ambulatory Visit (HOSPITAL_COMMUNITY)
Admission: RE | Admit: 2014-04-04 | Discharge: 2014-04-04 | Disposition: A | Discharge status: Home / Self Care | Payer: Medicare Other | Source: Ambulatory Visit | Attending: Cardiovascular Disease | Admitting: Cardiovascular Disease

## 2014-04-04 DIAGNOSIS — I739 Peripheral vascular disease, unspecified: Secondary | ICD-10-CM | POA: Diagnosis not present

## 2014-04-04 DIAGNOSIS — R0989 Other specified symptoms and signs involving the circulatory and respiratory systems: Secondary | ICD-10-CM | POA: Diagnosis not present

## 2014-04-04 NOTE — Progress Notes (Signed)
Carotid Duplex Completed. °Brianna L Mazza,RVT °

## 2014-04-04 NOTE — Progress Notes (Signed)
Lower Extremity Arterial Duplex Completed. °Brianna L Mazza,RVT °

## 2014-04-26 ENCOUNTER — Encounter: Payer: Self-pay | Admitting: Cardiovascular Disease

## 2014-04-26 ENCOUNTER — Ambulatory Visit (INDEPENDENT_AMBULATORY_CARE_PROVIDER_SITE_OTHER): Payer: Medicare Other | Admitting: Cardiovascular Disease

## 2014-04-26 VITALS — BP 132/62 | HR 101 | Ht 65.0 in | Wt 163.7 lb

## 2014-04-26 DIAGNOSIS — Z7902 Long term (current) use of antithrombotics/antiplatelets: Secondary | ICD-10-CM

## 2014-04-26 DIAGNOSIS — I779 Disorder of arteries and arterioles, unspecified: Secondary | ICD-10-CM | POA: Insufficient documentation

## 2014-04-26 DIAGNOSIS — Z79899 Other long term (current) drug therapy: Secondary | ICD-10-CM | POA: Diagnosis not present

## 2014-04-26 DIAGNOSIS — I739 Peripheral vascular disease, unspecified: Secondary | ICD-10-CM

## 2014-04-26 DIAGNOSIS — D689 Coagulation defect, unspecified: Secondary | ICD-10-CM

## 2014-04-26 DIAGNOSIS — R5383 Other fatigue: Secondary | ICD-10-CM

## 2014-04-26 DIAGNOSIS — Z1383 Encounter for screening for respiratory disorder NEC: Secondary | ICD-10-CM

## 2014-04-26 DIAGNOSIS — I10 Essential (primary) hypertension: Secondary | ICD-10-CM

## 2014-04-26 MED ORDER — ASPIRIN EC 81 MG PO TBEC: 81.0000 mg | DELAYED_RELEASE_TABLET | Freq: Every day | ORAL | Status: DC

## 2014-04-26 NOTE — Progress Notes (Signed)
04/26/2014 Christian Bailey   09-Jan-1941  [id number]  Primary Physician Tivis Ringer, MD Primary Cardiologist: Lorretta Harp MD [name]   HPI:  The patient is a very pleasant 73 year old, married Caucasian male, father of 2, grandfather to 1 grandchild who I last saw 4 months ago.His daughter-in-law is a primary care physician at [employer]. He has a history of PVOD and stenting of his left common iliac artery 20 years ago. He was smoking 3 packs a day at that time but stopped smoking 20 years ago.   His other problems include hypertension, hyperlipidemia and noninsulin-requiring diabetes. He had a negative Myoview in our office August 18, 2010. Because of claudication, I performed angiograph on him May 12, 2010, revealing an intact left common iliac artery stent, high-grade left external iliac artery stenosis which I stented using a 7 mm x 3 cm long Nitinol self-expanding stent. I also put 2 stents in his right SFA. Followup Dopplers showed improvement but symptomatically he did not really improve much. He still complains of predictable bilateral lower extremity claudication at several hundred yards. His Dopplers have remained stable. Dr. Dagmar Hait follows his lipid profile which was recently done earlier this month revealing a total cholesterol of 153, LDL of 86 and HDL of 34. Recent lower with Doppler studies performed in October 2014 revealed patent stents with a right ABI of 0.81 and a left of 0.67.over the past year he's noticed progressive but without limiting claudication with recent Dopplers performed 04/04/14 revealing a decline in his right ABI from 0.81-0.53 with progression of disease in his right SFA suggesting "in-stent restenosis.I also performed carotid Doppler studies that suggested moderately severe right internal carotid artery stenosis.    Current Outpatient Prescriptions  Medication Sig Dispense Refill  . cilostazol (PLETAL) 100 MG tablet Take 1 tablet  (100 mg total) by mouth 2 (two) times daily.  180 tablet  3  . clopidogrel (PLAVIX) 75 MG tablet TAKE 1 TABLET DAILY  90 tablet  2  . co-enzyme Q-10 30 MG capsule Take 30 mg by mouth daily.      [name] esomeprazole (NEXIUM) 40 MG capsule Take 40 mg by mouth daily before breakfast.      . glucosamine-chondroitin 500-400 MG tablet Take 1 tablet by mouth 2 (two) times daily.      [name] lisinopril (PRINIVIL,ZESTRIL) 5 MG tablet Take 5 mg by mouth 2 (two) times daily.       . metFORMIN (GLUCOPHAGE) 1000 MG tablet Take 1,000 mg by mouth 2 (two) times daily with a meal.      . metoprolol succinate (TOPROL-XL) 25 MG 24 hr tablet Take 25 mg by mouth 2 (two) times daily.       . Omega-3 Fatty Acids (FISH OIL) 1000 MG CAPS Take 1 capsule (1,000 mg total) by mouth daily.    0  . rosuvastatin (CRESTOR) 20 MG tablet Take 20 mg by mouth daily.       No current facility-administered medications for this visit.    No Known Allergies  History   Social History  . Marital Status: Married    Spouse Name: N/A    Number of Children: 2  . Years of Education: N/A   Occupational History  . retired   .     Social History Main Topics  . Smoking status: Former Research scientist (life sciences)  . Smokeless tobacco: Never Used  . Alcohol Use: Yes     Comment: rarely  . Drug Use: No  .  Sexual Activity: Not on file   Other Topics Concern  . Not on file   Social History Narrative  . No narrative on file     Review of Systems: General: negative for chills, fever, night sweats or weight changes.  Cardiovascular: negative for chest pain, dyspnea on exertion, edema, orthopnea, palpitations, paroxysmal nocturnal dyspnea or shortness of breath Dermatological: negative for rash Respiratory: negative for cough or wheezing Urologic: negative for hematuria Abdominal: negative for nausea, vomiting, diarrhea, bright red blood per rectum, melena, or hematemesis Neurologic: negative for visual changes, syncope, or dizziness All other systems  reviewed and are otherwise negative except as noted above.    Blood pressure 132/62, pulse 101, height 5\' 5"  (1.651 m), weight 163 lb 11.2 oz (74.254 kg).  General appearance: alert and no distress Neck: no adenopathy, no JVD, supple, symmetrical, trachea midline, thyroid not enlarged, symmetric, no tenderness/mass/nodules and soft bilateral carotid bruits Lungs: clear to auscultation bilaterally Heart: regular rate and rhythm, S1, S2 normal, no murmur, click, rub or gallop Extremities: extremities normal, atraumatic, no cyanosis or edema  EKG sinus tachycardia at 101 with right bundle branch block unchanged from prior EKGs  ASSESSMENT AND PLAN:   Carotid artery disease 70-80% right internal carotid artery stenosis by duplex ultrasound. He is neurologically asymptomatic. He is on Plavix. He may be a candidate for CREST 2 carotid revascularization randomization.I'm going to do a lower extremity angiogram with intervention on him in the upcoming future and at that time we'll perform a cerebral angiographic determine exact severity of his disease  Essential hypertension Controlled on current medications  Hyperlipidemia On statin therapy followed by his PCP  PAD (peripheral artery disease), hx remote stent Lt common iliac, 04/2010-stent to lt common iliac & 2 stents to the Rt SFA History of PAD with remote left common iliac artery stenting. I stented his left external iliac artery and his right SFA 05/12/10. He has a known occluded left SFA. Over the last year he's noted progressive claudication with worsening of his Doppler studies. He appears to have "in-stent restenosis" within the right SFA stent. We discussed options and have agreed to proceed with angiography and potential percutaneous intervention.      Lorretta Harp MD FACP,FACC,FAHA, [facility] 04/26/2014 3:20 PM

## 2014-04-26 NOTE — Patient Instructions (Signed)
Dr. Gwenlyn Found has ordered a peripheral (Carotid and Lower Extremity) angiogram to be done at [facility].  This procedure is going to look at the bloodflow in your lower extremities.  If Dr. Gwenlyn Found is able to open up the arteries, you will have to spend one night in the hospital.  If he is not able to open the arteries, you will be able to go home that same day.    After the procedure, you will not be allowed to drive for 3 days or push, pull, or lift anything greater than 10 lbs for one week.    You will be required to have the following tests prior to the procedure:  1. Blood work-the blood work can be done no more than 7 days prior to the procedure.  It can be done at any [facility] lab.  There is one downstairs on the first floor of this building and one in the [place] ([address])  2. Chest Xray-the chest xray order has already been placed at the [place].     *REPS [place]  LEFT Groin  Start low dose Aspirin (81mg ).

## 2014-04-26 NOTE — Assessment & Plan Note (Signed)
Controlled on current medications 

## 2014-04-26 NOTE — Assessment & Plan Note (Signed)
History of PAD with remote left common iliac artery stenting. I stented his left external iliac artery and his right SFA 05/12/10. He has a known occluded left SFA. Over the last year he's noted progressive claudication with worsening of his Doppler studies. He appears to have "in-stent restenosis" within the right SFA stent. We discussed options and have agreed to proceed with angiography and potential percutaneous intervention.

## 2014-04-26 NOTE — Assessment & Plan Note (Signed)
On statin therapy followed by his PCP 

## 2014-04-26 NOTE — Assessment & Plan Note (Signed)
70-80% right internal carotid artery stenosis by duplex ultrasound. He is neurologically asymptomatic. He is on Plavix. He may be a candidate for CREST 2 carotid revascularization randomization.I'm going to do a lower extremity angiogram with intervention on him in the upcoming future and at that time we'll perform a cerebral angiographic determine exact severity of his disease

## 2014-05-01 ENCOUNTER — Telehealth: Payer: Self-pay | Admitting: Cardiovascular Disease

## 2014-05-01 NOTE — Telephone Encounter (Signed)
Patient is scheduled for angiogram and is OK with holding his diabetes medication but is wondering if he should really take this plavix as usual. He reports he bleeds like crazy with a little scratch and that he bruises easier than normal. He also states he DOES NOT take aspirin 81mg  daily (this is noted to have been started at last OV, per AVS instructions). He also states he got a call from [facility] yesterday asking him to provide a list of all his medications, which he did not know off hand, and asked that they call him back in 5 minutes and they did not do this. He was upset about this. He would like Curt Bears to call him back

## 2014-05-01 NOTE — Telephone Encounter (Signed)
Pt called in wanting to speak to Dr. Kennon Holter nurse about an appt that he will have at [facility]. Please call  Thanks

## 2014-05-01 NOTE — Telephone Encounter (Signed)
I called patient back and his wife answered the phone.  She was concerned that [facility] called and wanted to verify his medications.  He asked them to call back in 5 minutes, but they didn't.  I comforted Ms Arakawa and told her that the pharmacy can see our list of medications, which is accurate. She verbalized understanding.

## 2014-05-03 DIAGNOSIS — H3532 Exudative age-related macular degeneration: Secondary | ICD-10-CM | POA: Diagnosis not present

## 2014-05-03 DIAGNOSIS — H35052 Retinal neovascularization, unspecified, left eye: Secondary | ICD-10-CM | POA: Diagnosis not present

## 2014-05-08 ENCOUNTER — Ambulatory Visit
Admission: RE | Admit: 2014-05-08 | Discharge: 2014-05-08 | Disposition: A | Discharge status: Home / Self Care | Payer: Medicare Other | Source: Ambulatory Visit | Attending: Cardiovascular Disease | Admitting: Cardiovascular Disease

## 2014-05-08 DIAGNOSIS — R5383 Other fatigue: Secondary | ICD-10-CM | POA: Diagnosis not present

## 2014-05-08 DIAGNOSIS — D689 Coagulation defect, unspecified: Secondary | ICD-10-CM | POA: Diagnosis not present

## 2014-05-08 DIAGNOSIS — Z79899 Other long term (current) drug therapy: Secondary | ICD-10-CM | POA: Diagnosis not present

## 2014-05-08 DIAGNOSIS — Z1383 Encounter for screening for respiratory disorder NEC: Secondary | ICD-10-CM

## 2014-05-08 DIAGNOSIS — Z01810 Encounter for preprocedural cardiovascular examination: Secondary | ICD-10-CM | POA: Diagnosis not present

## 2014-05-08 LAB — CBC
HCT: 37.7 % — ABNORMAL LOW (ref 39.0–52.0)
Hemoglobin: 12.5 g/dL — ABNORMAL LOW (ref 13.0–17.0)
MCH: 29.3 pg (ref 26.0–34.0)
MCHC: 33.2 g/dL (ref 30.0–36.0)
MCV: 88.3 fL (ref 78.0–100.0)
PLATELETS: 298 10*3/uL (ref 150–400)
RBC: 4.27 MIL/uL (ref 4.22–5.81)
RDW: 13.4 % (ref 11.5–15.5)
WBC: 8.3 10*3/uL (ref 4.0–10.5)

## 2014-05-08 LAB — BASIC METABOLIC PANEL
BUN: 21 mg/dL (ref 6–23)
CALCIUM: 9.8 mg/dL (ref 8.4–10.5)
CHLORIDE: 101 meq/L (ref 96–112)
CO2: 30 mEq/L (ref 19–32)
Creat: 1 mg/dL (ref 0.50–1.35)
Glucose, Bld: 144 mg/dL — ABNORMAL HIGH (ref 70–99)
Potassium: 4.4 mEq/L (ref 3.5–5.3)
SODIUM: 139 meq/L (ref 135–145)

## 2014-05-08 LAB — APTT: aPTT: 30 seconds (ref 24–37)

## 2014-05-08 LAB — PROTIME-INR
INR: 1.01 (ref <1.50)
Prothrombin Time: 13.3 seconds (ref 11.6–15.2)

## 2014-05-08 LAB — TSH: TSH: 1.979 u[IU]/mL (ref 0.350–4.500)

## 2014-05-09 DIAGNOSIS — H3532 Exudative age-related macular degeneration: Secondary | ICD-10-CM | POA: Diagnosis not present

## 2014-05-09 DIAGNOSIS — H35351 Cystoid macular degeneration, right eye: Secondary | ICD-10-CM | POA: Diagnosis not present

## 2014-05-10 ENCOUNTER — Encounter: Payer: Self-pay | Admitting: *Deleted

## 2014-05-12 DIAGNOSIS — Z23 Encounter for immunization: Secondary | ICD-10-CM | POA: Diagnosis not present

## 2014-05-14 ENCOUNTER — Encounter (HOSPITAL_COMMUNITY)
Admission: RE | Disposition: A | Discharge status: Home / Self Care | Payer: Self-pay | Source: Ambulatory Visit | Attending: Cardiovascular Disease

## 2014-05-14 ENCOUNTER — Ambulatory Visit (HOSPITAL_COMMUNITY)
Admission: RE | Admit: 2014-05-14 | Discharge: 2014-05-15 | Disposition: A | Discharge status: Home / Self Care | Payer: Medicare Other | Source: Ambulatory Visit | Attending: Cardiovascular Disease | Admitting: Cardiovascular Disease

## 2014-05-14 ENCOUNTER — Encounter: Payer: Self-pay | Admitting: *Deleted

## 2014-05-14 ENCOUNTER — Encounter (HOSPITAL_COMMUNITY): Payer: Self-pay | Admitting: General Practice

## 2014-05-14 DIAGNOSIS — Z87891 Personal history of nicotine dependence: Secondary | ICD-10-CM | POA: Insufficient documentation

## 2014-05-14 DIAGNOSIS — I70211 Atherosclerosis of native arteries of extremities with intermittent claudication, right leg: Secondary | ICD-10-CM

## 2014-05-14 DIAGNOSIS — I6521 Occlusion and stenosis of right carotid artery: Secondary | ICD-10-CM | POA: Insufficient documentation

## 2014-05-14 DIAGNOSIS — Z7902 Long term (current) use of antithrombotics/antiplatelets: Secondary | ICD-10-CM

## 2014-05-14 DIAGNOSIS — E119 Type 2 diabetes mellitus without complications: Secondary | ICD-10-CM | POA: Diagnosis not present

## 2014-05-14 DIAGNOSIS — R000 Tachycardia, unspecified: Secondary | ICD-10-CM

## 2014-05-14 DIAGNOSIS — I779 Disorder of arteries and arterioles, unspecified: Secondary | ICD-10-CM | POA: Diagnosis present

## 2014-05-14 DIAGNOSIS — Y838 Other surgical procedures as the cause of abnormal reaction of the patient, or of later complication, without mention of misadventure at the time of the procedure: Secondary | ICD-10-CM | POA: Diagnosis not present

## 2014-05-14 DIAGNOSIS — I739 Peripheral vascular disease, unspecified: Secondary | ICD-10-CM | POA: Diagnosis not present

## 2014-05-14 DIAGNOSIS — T82858A Stenosis of vascular prosthetic devices, implants and grafts, initial encounter: Secondary | ICD-10-CM | POA: Diagnosis not present

## 2014-05-14 DIAGNOSIS — D689 Coagulation defect, unspecified: Secondary | ICD-10-CM

## 2014-05-14 DIAGNOSIS — E785 Hyperlipidemia, unspecified: Secondary | ICD-10-CM | POA: Diagnosis not present

## 2014-05-14 DIAGNOSIS — Z79899 Other long term (current) drug therapy: Secondary | ICD-10-CM

## 2014-05-14 DIAGNOSIS — I10 Essential (primary) hypertension: Secondary | ICD-10-CM | POA: Diagnosis not present

## 2014-05-14 DIAGNOSIS — R5383 Other fatigue: Secondary | ICD-10-CM

## 2014-05-14 DIAGNOSIS — Z1383 Encounter for screening for respiratory disorder NEC: Secondary | ICD-10-CM

## 2014-05-14 HISTORY — DX: Tachycardia, unspecified: R00.0

## 2014-05-14 HISTORY — DX: Adverse effect of unspecified anesthetic, initial encounter: T41.45XA

## 2014-05-14 HISTORY — DX: Other complications of anesthesia, initial encounter: T88.59XA

## 2014-05-14 HISTORY — DX: Gastro-esophageal reflux disease without esophagitis: K21.9

## 2014-05-14 HISTORY — PX: OTHER SURGICAL HISTORY: SHX169

## 2014-05-14 HISTORY — DX: Personal history of other diseases of the digestive system: Z87.19

## 2014-05-14 HISTORY — PX: CEREBRAL ANGIOGRAM: SHX5506

## 2014-05-14 HISTORY — DX: Occlusion and stenosis of unspecified carotid artery: I65.29

## 2014-05-14 HISTORY — PX: LOWER EXTREMITY ANGIOGRAM: SHX5508

## 2014-05-14 HISTORY — DX: Other specified postprocedural states: R11.2

## 2014-05-14 HISTORY — DX: Other specified postprocedural states: Z98.890

## 2014-05-14 LAB — GLUCOSE, CAPILLARY
Glucose-Capillary: 131 mg/dL — ABNORMAL HIGH (ref 70–99)
Glucose-Capillary: 107 mg/dL — ABNORMAL HIGH (ref 70–99)
Glucose-Capillary: 283 mg/dL — ABNORMAL HIGH (ref 70–99)
Glucose-Capillary: 250 mg/dL — ABNORMAL HIGH (ref 70–99)

## 2014-05-14 LAB — POCT ACTIVATED CLOTTING TIME
ACTIVATED CLOTTING TIME: 208 s
ACTIVATED CLOTTING TIME: 168 s
Activated Clotting Time: 219 seconds

## 2014-05-14 SURGERY — CEREBRAL ANGIOGRAM
Anesthesia: LOCAL | Laterality: Right

## 2014-05-14 MED ORDER — CILOSTAZOL 100 MG PO TABS
100.0000 mg | ORAL_TABLET | Freq: Two times a day (BID) | ORAL | Status: DC
  Administered 2014-05-14 – 2014-05-15 (×2): 100 mg via ORAL
  Filled 2014-05-14 (×3): qty 1

## 2014-05-14 MED ORDER — CLOPIDOGREL BISULFATE 75 MG PO TABS: 75.0000 mg | ORAL_TABLET | Freq: Every day | ORAL | Status: DC

## 2014-05-14 MED ORDER — HYDRALAZINE HCL 20 MG/ML IJ SOLN: 10.0000 mg | INTRAMUSCULAR | Status: DC | PRN

## 2014-05-14 MED ORDER — ACETAMINOPHEN 325 MG PO TABS: 650.0000 mg | ORAL_TABLET | ORAL | Status: DC | PRN

## 2014-05-14 MED ORDER — METOPROLOL SUCCINATE ER 25 MG PO TB24
25.0000 mg | ORAL_TABLET | Freq: Two times a day (BID) | ORAL | Status: DC
  Administered 2014-05-14 – 2014-05-15 (×2): 25 mg via ORAL
  Filled 2014-05-14 (×3): qty 1

## 2014-05-14 MED ORDER — SODIUM CHLORIDE 0.9 % IJ SOLN: 3.0000 mL | INTRAMUSCULAR | Status: DC | PRN

## 2014-05-14 MED ORDER — SODIUM CHLORIDE 0.9 % IV SOLN
INTRAVENOUS | Status: DC
  Administered 2014-05-14: 10:00:00 via INTRAVENOUS

## 2014-05-14 MED ORDER — LISINOPRIL 5 MG PO TABS
5.0000 mg | ORAL_TABLET | Freq: Two times a day (BID) | ORAL | Status: DC
  Administered 2014-05-14: 20:00:00 5 mg via ORAL
  Filled 2014-05-14 (×3): qty 1

## 2014-05-14 MED ORDER — CEFTRIAXONE SODIUM IN DEXTROSE 20 MG/ML IV SOLN
1.0000 g | Freq: Once | INTRAVENOUS | Status: AC
  Administered 2014-05-14: 17:00:00 1 g via INTRAVENOUS
  Filled 2014-05-14 (×2): qty 50

## 2014-05-14 MED ORDER — MORPHINE SULFATE 2 MG/ML IJ SOLN
INTRAMUSCULAR | Status: AC
  Filled 2014-05-14: qty 1

## 2014-05-14 MED ORDER — HEPARIN (PORCINE) IN NACL 2-0.9 UNIT/ML-% IJ SOLN
INTRAMUSCULAR | Status: AC
  Filled 2014-05-14: qty 1000

## 2014-05-14 MED ORDER — INSULIN ASPART 100 UNIT/ML ~~LOC~~ SOLN: 0.0000 [IU] | Freq: Three times a day (TID) | SUBCUTANEOUS | Status: DC

## 2014-05-14 MED ORDER — ROSUVASTATIN CALCIUM 20 MG PO TABS
20.0000 mg | ORAL_TABLET | Freq: Every day | ORAL | Status: DC
  Administered 2014-05-14 – 2014-05-15 (×2): 20 mg via ORAL
  Filled 2014-05-14 (×2): qty 1

## 2014-05-14 MED ORDER — HEPARIN SODIUM (PORCINE) 1000 UNIT/ML IJ SOLN
INTRAMUSCULAR | Status: AC
  Filled 2014-05-14: qty 1

## 2014-05-14 MED ORDER — CLOPIDOGREL BISULFATE 75 MG PO TABS
75.0000 mg | ORAL_TABLET | Freq: Every day | ORAL | Status: DC
  Administered 2014-05-15: 75 mg via ORAL
  Filled 2014-05-14: qty 1

## 2014-05-14 MED ORDER — SODIUM CHLORIDE 0.9 % IV SOLN
INTRAVENOUS | Status: AC
  Administered 2014-05-14: 13:00:00 via INTRAVENOUS

## 2014-05-14 MED ORDER — ONDANSETRON HCL 4 MG/2ML IJ SOLN: 4.0000 mg | Freq: Four times a day (QID) | INTRAMUSCULAR | Status: DC | PRN

## 2014-05-14 MED ORDER — MORPHINE SULFATE 2 MG/ML IJ SOLN
2.0000 mg | INTRAMUSCULAR | Status: DC | PRN
  Administered 2014-05-14: 2 mg via INTRAVENOUS

## 2014-05-14 MED ORDER — HEPARIN (PORCINE) IN NACL 2-0.9 UNIT/ML-% IJ SOLN
INTRAMUSCULAR | Status: AC
  Filled 2014-05-14: qty 500

## 2014-05-14 MED ORDER — LIDOCAINE HCL (PF) 1 % IJ SOLN
INTRAMUSCULAR | Status: AC
  Filled 2014-05-14: qty 30

## 2014-05-14 NOTE — Interval H&P Note (Signed)
History and Physical Interval Note:  05/14/2014 11:33 AM  Christian Bailey  has presented today for surgery, with the diagnosis of pvd  The various methods of treatment have been discussed with the patient and family. After consideration of risks, benefits and other options for treatment, the patient has consented to  Procedure(s): CEREBRAL ANGIOGRAM (N/A) LOWER EXTREMITY ANGIOGRAM (N/A) as a surgical intervention .  The patient's history has been reviewed, patient examined, no change in status, stable for surgery.  I have reviewed the patient's chart and labs.  Questions were answered to the patient's satisfaction.     Lorretta Harp

## 2014-05-14 NOTE — Progress Notes (Signed)
Site area: left groin  Site Prior to Removal:  Level 0  Pressure Applied For 20 MINUTES    Minutes Beginning at 1440  Manual:   Yes.    Patient Status During Pull:  stable  Post Pull Groin Site:  Level 0  Post Pull Instructions Given:  Yes.    Post Pull Pulses Present:  Yes.    Dressing Applied:  Yes.    Comments:   

## 2014-05-14 NOTE — CV Procedure (Signed)
Christian Bailey is a 73 y.o. male    [id number] LOCATION:  FACILITY: [facility]  PHYSICIAN: Quay Burow, M.D. 08-05-1940   DATE OF PROCEDURE:  05/14/2014  DATE OF DISCHARGE:     PV Angiogram/Intervention    History obtained from chart review.The patient is a very pleasant 73 year old, married Caucasian male, father of 2, grandfather to 1 grandchild who I last saw 4 months ago.His daughter-in-law is a primary care physician at [employer]. He has a history of PVOD and stenting of his left common iliac artery 20 years ago. He was smoking 3 packs a day at that time but stopped smoking 20 years ago.   His other problems include hypertension, hyperlipidemia and noninsulin-requiring diabetes. He had a negative Myoview in our office August 18, 2010. Because of claudication, I performed angiograph on him May 12, 2010, revealing an intact left common iliac artery stent, high-grade left external iliac artery stenosis which I stented using a 7 mm x 3 cm long Nitinol self-expanding stent. I also put 2 stents in his right SFA. Followup Dopplers showed improvement but symptomatically he did not really improve much. He still complains of predictable bilateral lower extremity claudication at several hundred yards. His Dopplers have remained stable. Dr. Dagmar Hait follows his lipid profile which was recently done earlier this month revealing a total cholesterol of 153, LDL of 86 and HDL of 34. Recent lower with Doppler studies performed in October 2014 revealed patent stents with a right ABI of 0.81 and a left of 0.67.over the past year he's noticed progressive but without limiting claudication with recent Dopplers performed 04/04/14 revealing a decline in his right ABI from 0.81-0.53 with progression of disease in his right SFA suggesting "in-stent restenosis.I also performed carotid Doppler studies that suggested moderately severe right internal carotid artery stenosis.the patient presents today for angiography of his  right carotid artery as well as his lower extremities with intent to perform her case intervention on his right SFA for "in-stent restenosis" in the setting of lifestyle limiting claudication.   PROCEDURE DESCRIPTION:   The patient was brought to the second floor [facility] lab in the postabsorptive state. He was not premedicated . His left groinwas prepped and shaved in usual sterile fashion. Xylocaine 1% was used for local anesthesia. A 5 French sheath was inserted into the left common femoral artery using standard Seldinger technique. A 5 French pigtail catheter was used to perform aortic arch angiography, midstream and distal abdominal aortography, bilateral iliac angiography with bifemoral runoff using bolus chase digital subtraction step table technique. Omnipaque dye was used for the entirety of the case. Retrograde aortic pressure was monitored during the case.   HEMODYNAMICS:    AO SYSTOLIC/AO DIASTOLIC: 809/98   Angiographic Data:   1: Aortic arch angiography-bovine arch  2: Right carotid artery-50% proximal/hypodense right internal carotid artery stenosis.  3: Abdominal aortogram-widely patent renal arteries, normal infrarenal abdominal aorta.  4: Left lower extremity-patent left external iliac artery stent with mild in-stent restenosis, occluded left SFA with reconstitution in the adductor canal by profunda femoris femoris collaterals, 2 vessel runoff with an occluded posterior tibial  5: Right lower extremity-patent mid right SFA stent, high-grade "in-stent restenosis" within the distal right SFA stent with three-vessel runoff  IMPRESSION:Christian Bailey has a moderate right internal carotid artery stenosis in the 50% range. We will continue to follow this by duplex ultrasound. He has high-grade "in-stent restenosis" within the more distal right SFA nitinol stent. We'll proceed with PTA using drug eluting  balloon for "in-stent restenosis".  Procedure Description:the  patient received 7500 units of heparin bolus with an ACT of 219. Total contrast administered to the patient was 194 mL. Contralateral access was obtained with a crossover catheter Glidewire and 0.35 Rosen wire. A 7 French multipurpose destination sheath was then used to obtain contralateral access. The lesion was predilated with a 4 mm x 100 mm long angioplasty balloon over a 0.14/300 cm long [place] core wire. PTA was performed with a 5 mm x 100 mm long Lutonix  drug eluting balloon at 10 atm for 20 half minutes. The final angiographic result with reduction of a 95% "in-stent restenosis to 0% residual with excellent antegrade result. Angiogram was also performed of the "trifurcation" revealing it to be widely patent as well. The sheath was then withdrawn across the bifurcation and exchanged over the Rosen wire for a short 7 French sheath which was secured in place. The patient left the lab in stable condition.  Final Impression: successful PTA of right SFA nitinol "in-stent restenosis with drug-eluting balloon. The patient also has only a moderate right internal carotid artery stenosis which we will continue to follow noninvasively by duplex ultrasound. He is on Plavix but has had GI bleeding on aspirin in the past. The sheath will be removed once the ACT falls below 170 and pressure will be held. He will be hydrated overnight, and discharged home in the morning. We will get follow-up lower extremity arterial Doppler studies in our [place] office in one week after which he will be seen back by a mid-level provider in 2-3 weeks on a daytime in the office.    BERRY,JONATHAN J. MD, FACC 05/14/2014 12:46 PM     

## 2014-05-14 NOTE — H&P (View-Only) (Signed)
04/26/2014 Christian Bailey   Nov 25, 1940  [id number]  Primary Physician Tivis Ringer, MD Primary Cardiologist: Lorretta Harp MD [name]   HPI:  The patient is a very pleasant 73 year old, married Caucasian male, father of 2, grandfather to 1 grandchild who I last saw 4 months ago.His daughter-in-law is a primary care physician at [employer]. He has a history of PVOD and stenting of his left common iliac artery 20 years ago. He was smoking 3 packs a day at that time but stopped smoking 20 years ago.   His other problems include hypertension, hyperlipidemia and noninsulin-requiring diabetes. He had a negative Myoview in our office August 18, 2010. Because of claudication, I performed angiograph on him May 12, 2010, revealing an intact left common iliac artery stent, high-grade left external iliac artery stenosis which I stented using a 7 mm x 3 cm long Nitinol self-expanding stent. I also put 2 stents in his right SFA. Followup Dopplers showed improvement but symptomatically he did not really improve much. He still complains of predictable bilateral lower extremity claudication at several hundred yards. His Dopplers have remained stable. Dr. Dagmar Hait follows his lipid profile which was recently done earlier this month revealing a total cholesterol of 153, LDL of 86 and HDL of 34. Recent lower with Doppler studies performed in October 2014 revealed patent stents with a right ABI of 0.81 and a left of 0.67.over the past year he's noticed progressive but without limiting claudication with recent Dopplers performed 04/04/14 revealing a decline in his right ABI from 0.81-0.53 with progression of disease in his right SFA suggesting "in-stent restenosis.I also performed carotid Doppler studies that suggested moderately severe right internal carotid artery stenosis.    Current Outpatient Prescriptions  Medication Sig Dispense Refill  . cilostazol (PLETAL) 100 MG tablet Take 1 tablet  (100 mg total) by mouth 2 (two) times daily.  180 tablet  3  . clopidogrel (PLAVIX) 75 MG tablet TAKE 1 TABLET DAILY  90 tablet  2  . co-enzyme Q-10 30 MG capsule Take 30 mg by mouth daily.      [name] esomeprazole (NEXIUM) 40 MG capsule Take 40 mg by mouth daily before breakfast.      . glucosamine-chondroitin 500-400 MG tablet Take 1 tablet by mouth 2 (two) times daily.      [name] lisinopril (PRINIVIL,ZESTRIL) 5 MG tablet Take 5 mg by mouth 2 (two) times daily.       . metFORMIN (GLUCOPHAGE) 1000 MG tablet Take 1,000 mg by mouth 2 (two) times daily with a meal.      . metoprolol succinate (TOPROL-XL) 25 MG 24 hr tablet Take 25 mg by mouth 2 (two) times daily.       . Omega-3 Fatty Acids (FISH OIL) 1000 MG CAPS Take 1 capsule (1,000 mg total) by mouth daily.    0  . rosuvastatin (CRESTOR) 20 MG tablet Take 20 mg by mouth daily.       No current facility-administered medications for this visit.    No Known Allergies  History   Social History  . Marital Status: Married    Spouse Name: N/A    Number of Children: 2  . Years of Education: N/A   Occupational History  . retired   .     Social History Main Topics  . Smoking status: Former Research scientist (life sciences)  . Smokeless tobacco: Never Used  . Alcohol Use: Yes     Comment: rarely  . Drug Use: No  .  Sexual Activity: Not on file   Other Topics Concern  . Not on file   Social History Narrative  . No narrative on file     Review of Systems: General: negative for chills, fever, night sweats or weight changes.  Cardiovascular: negative for chest pain, dyspnea on exertion, edema, orthopnea, palpitations, paroxysmal nocturnal dyspnea or shortness of breath Dermatological: negative for rash Respiratory: negative for cough or wheezing Urologic: negative for hematuria Abdominal: negative for nausea, vomiting, diarrhea, bright red blood per rectum, melena, or hematemesis Neurologic: negative for visual changes, syncope, or dizziness All other systems  reviewed and are otherwise negative except as noted above.    Blood pressure 132/62, pulse 101, height 5\' 5"  (1.651 m), weight 163 lb 11.2 oz (74.254 kg).  General appearance: alert and no distress Neck: no adenopathy, no JVD, supple, symmetrical, trachea midline, thyroid not enlarged, symmetric, no tenderness/mass/nodules and soft bilateral carotid bruits Lungs: clear to auscultation bilaterally Heart: regular rate and rhythm, S1, S2 normal, no murmur, click, rub or gallop Extremities: extremities normal, atraumatic, no cyanosis or edema  EKG sinus tachycardia at 101 with right bundle branch block unchanged from prior EKGs  ASSESSMENT AND PLAN:   Carotid artery disease 70-80% right internal carotid artery stenosis by duplex ultrasound. He is neurologically asymptomatic. He is on Plavix. He may be a candidate for CREST 2 carotid revascularization randomization.I'm going to do a lower extremity angiogram with intervention on him in the upcoming future and at that time we'll perform a cerebral angiographic determine exact severity of his disease  Essential hypertension Controlled on current medications  Hyperlipidemia On statin therapy followed by his PCP  PAD (peripheral artery disease), hx remote stent Lt common iliac, 04/2010-stent to lt common iliac & 2 stents to the Rt SFA History of PAD with remote left common iliac artery stenting. I stented his left external iliac artery and his right SFA 05/12/10. He has a known occluded left SFA. Over the last year he's noted progressive claudication with worsening of his Doppler studies. He appears to have "in-stent restenosis" within the right SFA stent. We discussed options and have agreed to proceed with angiography and potential percutaneous intervention.      Lorretta Harp MD FACP,FACC,FAHA, [facility] 04/26/2014 3:20 PM

## 2014-05-15 ENCOUNTER — Telehealth: Payer: Self-pay | Admitting: Cardiovascular Disease

## 2014-05-15 ENCOUNTER — Encounter (HOSPITAL_COMMUNITY): Payer: Self-pay | Admitting: Physician Assistant

## 2014-05-15 ENCOUNTER — Other Ambulatory Visit: Payer: Self-pay | Admitting: Physician Assistant

## 2014-05-15 DIAGNOSIS — I10 Essential (primary) hypertension: Secondary | ICD-10-CM | POA: Diagnosis not present

## 2014-05-15 DIAGNOSIS — I6521 Occlusion and stenosis of right carotid artery: Secondary | ICD-10-CM | POA: Diagnosis not present

## 2014-05-15 DIAGNOSIS — E119 Type 2 diabetes mellitus without complications: Secondary | ICD-10-CM | POA: Diagnosis not present

## 2014-05-15 DIAGNOSIS — I739 Peripheral vascular disease, unspecified: Secondary | ICD-10-CM

## 2014-05-15 DIAGNOSIS — E785 Hyperlipidemia, unspecified: Secondary | ICD-10-CM | POA: Diagnosis not present

## 2014-05-15 DIAGNOSIS — T82858A Stenosis of vascular prosthetic devices, implants and grafts, initial encounter: Secondary | ICD-10-CM | POA: Diagnosis not present

## 2014-05-15 DIAGNOSIS — R000 Tachycardia, unspecified: Secondary | ICD-10-CM

## 2014-05-15 LAB — GLUCOSE, CAPILLARY
Glucose-Capillary: 98 mg/dL (ref 70–99)
Glucose-Capillary: 125 mg/dL — ABNORMAL HIGH (ref 70–99)

## 2014-05-15 LAB — BASIC METABOLIC PANEL
Anion gap: 11 (ref 5–15)
BUN: 16 mg/dL (ref 6–23)
CALCIUM: 8.8 mg/dL (ref 8.4–10.5)
CO2: 26 mEq/L (ref 19–32)
CREATININE: 1.05 mg/dL (ref 0.50–1.35)
Chloride: 103 mEq/L (ref 96–112)
GFR calc Af Amer: 80 mL/min — ABNORMAL LOW (ref >90)
GFR, EST NON AFRICAN AMERICAN: 69 mL/min — AB (ref >90)
GLUCOSE: 108 mg/dL — AB (ref 70–99)
Potassium: 4.4 mEq/L (ref 3.7–5.3)
SODIUM: 140 meq/L (ref 137–147)

## 2014-05-15 LAB — CBC
HEMATOCRIT: 34.1 % — AB (ref 39.0–52.0)
Hemoglobin: 11.4 g/dL — ABNORMAL LOW (ref 13.0–17.0)
MCH: 29 pg (ref 26.0–34.0)
MCHC: 33.4 g/dL (ref 30.0–36.0)
MCV: 86.8 fL (ref 78.0–100.0)
PLATELETS: 273 10*3/uL (ref 150–400)
RBC: 3.93 MIL/uL — ABNORMAL LOW (ref 4.22–5.81)
RDW: 12.8 % (ref 11.5–15.5)
WBC: 8.5 10*3/uL (ref 4.0–10.5)

## 2014-05-15 MED ORDER — PANTOPRAZOLE SODIUM 40 MG PO TBEC: 80.0000 mg | DELAYED_RELEASE_TABLET | Freq: Every day | ORAL | Status: DC

## 2014-05-15 MED ORDER — METFORMIN HCL 1000 MG PO TABS: 1000.0000 mg | ORAL_TABLET | Freq: Two times a day (BID) | ORAL | Status: DC

## 2014-05-15 NOTE — Discharge Summary (Signed)
Discharge Summary   Patient ID: Christian Bailey MRN: [id number], DOB/AGE: 72-Aug-1942 72 y.o. Admit date: 05/14/2014 D/C date:     05/15/2014  Primary Care Provider: Tivis Ringer, MD Primary Cardiologist/PV: Gwenlyn Found  Primary Discharge Diagnoses:  1. PAD - history: stenting of his left common iliac artery 20 years ago, h/o LEIA stent and 2 stents to R SFA in 2011  - this admission: s/p PTA of right SFA for in-stent restenosis, occluded left SFA 2. Carotid arterial disease - 50% right ICA stenosis 3. HTN with soft BP this admission 4. Hyperlipidemia 5. Sinus tachycardia  Secondary Discharge Diagnoses:  1. Diabetes mellitus 2. Prior tobacco abuse 3. History of GI bleed 4. Osteoarthritis 5. Anemia 6. Hx of colon polyps 7. RBBB with LAFB 8. GERD 9. Hx of hiatal hernia  Hospital Course: [name] is a 73 y/o M with history of PVD s/p stenting, HTN, HLD, NIDDM, and negative myoview in the office 07/2010 who presented to [facility] for planned PV angiogram. His daughter-in-law is a primary care physician at [employer].  He has a history of stenting of his left common iliac artery 20 years ago. In 2011, Dr. Gwenlyn Found found high-grade left external iliac artery stenosis which he stented using a 7 mm x 3 cm long Nitinol self-expanding stent. He also put 2 stents in his right SFA. Followup Dopplers showed improvement but symptomatically he did not really improve much. He recently presented to the office with progressive claudication. Recent Dopplers performed 04/04/14 revealing a decline in his right ABI from 0.81-0.53 with progression of disease in his right SFA suggesting in-stent restenosis. Carotid Dopplers suggested moderately severe right internal carotid artery stenosis. He was brought in for angiography 05/14/14 showing: 1: Aortic arch angiography-bovine arch 2: Right carotid artery-50% proximal/hypodense right internal carotid artery stenosis. 3: Abdominal aortogram-widely patent renal arteries,  normal infrarenal abdominal aorta. 4: Left lower extremity-patent left external iliac artery stent with mild in-stent restenosis, occluded left SFA with reconstitution in the adductor canal by profunda femoris femoris collaterals, 2 vessel runoff with an occluded posterior tibial 5: Right lower extremity-patent mid right SFA stent, high-grade "in-stent restenosis" within the distal right SFA stent with three-vessel runoff The patient ultimately underwent successful PTA of right SFA nitinol in-stent restenosis with drug-eluting balloon. Dr. Gwenlyn Found plans to follow his carotid stenosis noninvasively. He reports the patient has a history of GI bleeding on aspirin in the past so the present plans are for the patient to continue Plavix without aspirin. I also confirmed with Dr. Martinique that the plan is to continue Pletal. The patient was hydrated overnight. This AM he is feeling well. He did have a transient drop in his BP to the 80s-90s but this recovered without intervention. He was asymptomatic. Recheck BP was 113/41. He ambulated without any problems. He appears to have chronic sinus tachycardia for unclear reasons but is not acutely anemic, infected or thyrotoxic (TSH normal 03/2014). Per discussion with Dr. Martinique we will hold lisinopril until followup in the office due to hypotension, but continue beta blocker. Patient was instructed to monitor BP at home and call if it begins to elevate > 130/80. Dr. Martinique has seen and examined the patient today and feels he is stable for discharge. I have left a message on our office's scheduling inbox requesting RLE arterial duplex and a follow-up appointment, and our office will call the patient.   Med changes include changing Nexium to equivalent dose of Protonix given Plavix use. He was told to  hold Metformin for 48 hours post-cath. Holding lisinopril as above.   Discharge Vitals: Blood pressure 113/41, pulse 108, temperature 97.9 F (36.6 C), temperature source Oral,  resp. rate 20, height _0  (1.651 m), weight 158 lb 8.2 oz (71.9 kg), SpO2 93 %.  Labs: Lab Results  Component Value Date   WBC 8.5 05/15/2014   HGB 11.4* 05/15/2014   HCT 34.1* 05/15/2014   MCV 86.8 05/15/2014   PLT 273 05/15/2014    Recent Labs Lab 05/15/14 0416  NA 140  K 4.4  CL 103  CO2 26  BUN 16  CREATININE 1.05  CALCIUM 8.8  GLUCOSE 108*    Diagnostic Studies/Procedures   Dg Chest 2 View  05/08/2014   CLINICAL DATA:  Preoperative exam prior to angiography  EXAM: CHEST  2 VIEW  COMPARISON:  PA and lateral chest of May 07, 2010  FINDINGS: The lungs are well-expanded. There is no focal infiltrate. There is stable increase conspicuity of infrahilar lung markings bilaterally. The heart and pulmonary vascularity are normal. There is no pleural effusion or pneumothorax. There are degenerative changes of the right shoulder.  IMPRESSION: There is no acute cardiopulmonary abnormality.   Electronically Signed   By: David  Martinique   On: 05/08/2014 09:09   05/14/14 PV Angiogram/Intervention    History obtained from chart review.The patient is a very pleasant 73 year old, married Caucasian male, father of 2, grandfather to 1 grandchild who I last saw 4 months ago.His daughter-in-law is a primary care physician at [employer]. He has a history of PVOD and stenting of his left common iliac artery 20 years ago. He was smoking 3 packs a day at that time but stopped smoking 20 years ago.   His other problems include hypertension, hyperlipidemia and noninsulin-requiring diabetes. He had a negative Myoview in our office August 18, 2010. Because of claudication, I performed angiograph on him May 12, 2010, revealing an intact left common iliac artery stent, high-grade left external iliac artery stenosis which I stented using a 7 mm x 3 cm long Nitinol self-expanding stent. I also put 2 stents in his right SFA. Followup Dopplers showed improvement but symptomatically he did not really  improve much. He still complains of predictable bilateral lower extremity claudication at several hundred yards. His Dopplers have remained stable. Dr. Dagmar Hait follows his lipid profile which was recently done earlier this month revealing a total cholesterol of 153, LDL of 86 and HDL of 34. Recent lower with Doppler studies performed in October 2014 revealed patent stents with a right ABI of 0.81 and a left of 0.67.over the past year he's noticed progressive but without limiting claudication with recent Dopplers performed 04/04/14 revealing a decline in his right ABI from 0.81-0.53 with progression of disease in his right SFA suggesting "in-stent restenosis.I also performed carotid Doppler studies that suggested moderately severe right internal carotid artery stenosis.the patient presents today for angiography of his right carotid artery as well as his lower extremities with intent to perform her case intervention on his right SFA for "in-stent restenosis" in the setting of lifestyle limiting claudication.   PROCEDURE DESCRIPTION:   The patient was brought to the second floor [facility] cath lab in the postabsorptive state. He was not premedicated . His left groinwas prepped and shaved in usual sterile fashion. Xylocaine 1% was used for local anesthesia. A 5 French sheath was inserted into the left common femoral artery using standard Seldinger technique. A 5 French pigtail catheter was used to perform  aortic arch angiography, midstream and distal abdominal aortography, bilateral iliac angiography with bifemoral runoff using bolus chase digital subtraction step table technique. Omnipaque dye was used for the entirety of the case. Retrograde aortic pressure was monitored during the case.   HEMODYNAMICS:   AO SYSTOLIC/AO DIASTOLIC: 376/28  Angiographic Data:   1: Aortic arch angiography-bovine arch  2: Right carotid artery-50% proximal/hypodense right internal carotid artery stenosis.  3:  Abdominal aortogram-widely patent renal arteries, normal infrarenal abdominal aorta.  4: Left lower extremity-patent left external iliac artery stent with mild in-stent restenosis, occluded left SFA with reconstitution in the adductor canal by profunda femoris femoris collaterals, 2 vessel runoff with an occluded posterior tibial  5: Right lower extremity-patent mid right SFA stent, high-grade "in-stent restenosis" within the distal right SFA stent with three-vessel runoff  IMPRESSION:[name] has a moderate right internal carotid artery stenosis in the 50% range. We will continue to follow this by duplex ultrasound. He has high-grade "in-stent restenosis" within the more distal right SFA nitinol stent. We'll proceed with PTA using drug eluting balloon for "in-stent restenosis".  Procedure Description:the patient received 7500 units of heparin bolus with an ACT of 219. Total contrast administered to the patient was 194 mL. Contralateral access was obtained with a crossover catheter Glidewire and 0.35 Rosen wire. A 7 [place] multipurpose destination sheath was then used to obtain contralateral access. The lesion was predilated with a 4 mm x 100 mm long angioplasty balloon over a 0.14/300 cm long [place] core wire. PTA was performed with a 5 mm x 100 mm long Lutonix drug eluting balloon at 10 atm for 20 half minutes. The final angiographic result with reduction of a 95% "in-stent restenosis to 0% residual with excellent antegrade result. Angiogram was also performed of the "trifurcation" revealing it to be widely patent as well. The sheath was then withdrawn across the bifurcation and exchanged over the [facility] wire for a short 7 [place] sheath which was secured in place. The patient left the lab in stable condition.  Final Impression: successful PTA of right SFA nitinol "in-stent restenosis with drug-eluting balloon. The patient also has only a moderate right internal carotid artery stenosis which we will  continue to follow noninvasively by duplex ultrasound. He is on Plavix but has had GI bleeding on aspirin in the past. The sheath will be removed once the ACT falls below 170 and pressure will be held. He will be hydrated overnight, and discharged home in the morning. We will get follow-up lower extremity arterial Doppler studies in our [place] office in one week after which he will be seen back by a mid-level provider in 2-3 weeks on a daytime in the office.  Lorretta Harp MD, [facility] 05/14/2014 12:46 PM  Discharge Medications   Current Discharge Medication List    START taking these medications   Details  pantoprazole (PROTONIX) 40 MG tablet Take 2 tablets (80 mg total) by mouth daily. Qty: 60 tablet, Refills: 1      CONTINUE these medications which have CHANGED   Details  metFORMIN (GLUCOPHAGE) 1000 MG tablet Take 1 tablet (1,000 mg total) by mouth 2 (two) times daily with a meal. Instructed to restart the evening of 05/16/14      CONTINUE these medications which have NOT CHANGED   Details  cilostazol (PLETAL) 100 MG tablet Take 1 tablet (100 mg total) by mouth 2 (two) times daily.     clopidogrel (PLAVIX) 75 MG tablet TAKE 1 TABLET DAILY  co-enzyme Q-10 30 MG capsule Take 30 mg by mouth daily.    glucosamine-chondroitin 500-400 MG tablet Take 1 tablet by mouth 2 (two) times daily.    metoprolol succinate (TOPROL-XL) 25 MG 24 hr tablet Take 25 mg by mouth 2 (two) times daily.     Omega-3 Fatty Acids (FISH OIL) 1000 MG CAPS Take 1 capsule (1,000 mg total) by mouth daily.     rosuvastatin (CRESTOR) 20 MG tablet Take 20 mg by mouth daily.      STOP taking these medications     esomeprazole (NEXIUM) 40 MG capsule      lisinopril (PRINIVIL,ZESTRIL) 5 MG tablet         Disposition   The patient will be discharged in stable condition to home. Discharge Instructions    Diet - low sodium heart healthy    Complete by:  As directed      Increase activity slowly     Complete by:  As directed   !!!!!!!!!!!! IMPORTANT: You may restart Metformin tomorrow EVENING (05/16/14).  No driving for 2 days. No lifting over 5 lbs for 1 week. No sexual activity for 1 week. Keep procedure site clean & dry. If you notice increased pain, swelling, bleeding or pus, call/return!  You may shower, but no soaking baths/hot tubs/pools for 1 week.   Please monitor your blood pressure occasionally at home. We stopped your lisinopril since your blood pressure was low in the hospital. However, it might come back to normal or higher when you get home. Call your doctor if you tend to get readings of greater than 130 on the top number or 80 on the bottom number.  Some studies suggest Nexium/esomeprazole interacts with Plavix. We changed your Nexium/esomeprazole to the equivalent dose of Protonix for less chance of interaction.          Follow-up Information    Follow up with Lorretta Harp, MD.   Specialty:  Cardiology   Why:  Office will call you for your followup appointment. Call office if you have not heard back in 3 days.   Contact information:   [address] [place] [address] [phone]         Duration of Discharge Encounter: Greater than 30 minutes including physician and PA time.  Signed, Melina Copa PA-C 05/15/2014, 10:38 AM

## 2014-05-15 NOTE — Progress Notes (Signed)
TELEMETRY: Reviewed telemetry pt in NSR: Filed Vitals:   05/15/14 0338 05/15/14 0817 05/15/14 0834 05/15/14 0838  BP: 99/73 90/40 81/39  113/41  Pulse: 103 107 101 108  Temp: 98.1 F (36.7 C) 97.9 F (36.6 C)    TempSrc: Oral Oral    Resp: 20 18 17 20   Height:      Weight:      SpO2: 95% 93%      Intake/Output Summary (Last 24 hours) at 05/15/14 0924 Last data filed at 05/15/14 0817  Gross per 24 hour  Intake   1595 ml  Output   1575 ml  Net     20 ml   Filed Weights   05/14/14 0950 05/15/14 0026  Weight: 164 lb (74.39 kg) 158 lb 8.2 oz (71.9 kg)    Subjective Feels very well this am. No complaints.  . cilostazol  100 mg Oral BID  . clopidogrel  75 mg Oral Daily  . insulin aspart  0-9 Units Subcutaneous TID WC  . lisinopril  5 mg Oral BID  . metoprolol succinate  25 mg Oral BID  . rosuvastatin  20 mg Oral Daily      LABS: Basic Metabolic Panel:  Recent Labs  05/15/14 0416  NA 140  K 4.4  CL 103  CO2 26  GLUCOSE 108*  BUN 16  CREATININE 1.05  CALCIUM 8.8   Liver Function Tests: No results for input(s): AST, ALT, ALKPHOS, BILITOT, PROT, ALBUMIN in the last 72 hours. No results for input(s): LIPASE, AMYLASE in the last 72 hours. CBC:  Recent Labs  05/15/14 0416  WBC 8.5  HGB 11.4*  HCT 34.1*  MCV 86.8  PLT 273   Cardiac Enzymes: No results for input(s): CKTOTAL, CKMB, CKMBINDEX, TROPONINI in the last 72 hours. BNP: No results for input(s): PROBNP in the last 72 hours. D-Dimer: No results for input(s): DDIMER in the last 72 hours. Hemoglobin A1C: No results for input(s): HGBA1C in the last 72 hours. Fasting Lipid Panel: No results for input(s): CHOL, HDL, LDLCALC, TRIG, CHOLHDL, LDLDIRECT in the last 72 hours. Thyroid Function Tests: No results for input(s): TSH, T4TOTAL, T3FREE, THYROIDAB in the last 72 hours.  Invalid input(s): FREET3   Radiology/Studies:  No results found.  PHYSICAL EXAM General: Well developed, well nourished,  in no acute distress. Head: Normocephalic, atraumatic, sclera non-icteric, oropharynx is clear Neck: Negative for carotid bruits. JVD not elevated. No adenopathy Lungs: Clear bilaterally to auscultation without wheezes, rales, or rhonchi. Breathing is unlabored. Heart: RRR S1 S2 without murmurs, rubs, or gallops.  Abdomen: Soft, non-tender, non-distended with normoactive bowel sounds. No hepatomegaly. No rebound/guarding. No obvious abdominal masses. Msk:  Strength and tone appears normal for age. Extremities: No clubbing, cyanosis or edema.  Distal pedal pulses are palpable. No left groin hematoma. Neuro: Alert and oriented X 3. Moves all extremities spontaneously. Psych:  Responds to questions appropriately with a normal affect.  ASSESSMENT AND PLAN: 1. PAD s/p PTA of right SFA for in-stent restenosis. Occluded left SFA. Doing well post PTA. No complications. Stable for DC today. Follow up with Dr. Gwenlyn Found. DC on Plavix only with history of GI bleeding on ASA in the past. 2. Carotid arterial disease. 50% right ICA stenosis.  3. HTN controlled.  4. Hyperlipidemia. Continue lipid lowering therapy.  Present on Admission:  . PAD (peripheral artery disease), hx remote stent Lt common iliac, 04/2010-stent to lt common iliac & 2 stents to the Rt SFA . Claudication  Signed, Collier Salina  Martinique, [place] 05/15/2014 9:24 AM

## 2014-05-15 NOTE — Progress Notes (Signed)
Post ambulation

## 2014-05-15 NOTE — Progress Notes (Signed)
Tele SR-ST 90-100's at rest w/ HR up to 120 w/ just standing at bedside to use urinal.  Pt denies complaints.  Left groin drsg removed, bandaid applied, level 0.

## 2014-05-15 NOTE — Progress Notes (Signed)
preambulation

## 2014-05-15 NOTE — Progress Notes (Signed)
Pre and post ambulatory vital signs charted; heart rate while ambulating 118.  Melina Copa, PA, notified.

## 2014-05-15 NOTE — Telephone Encounter (Signed)
Mr.Steinborn is calling to see if he can take a particular drug (panpoprazole) in lew of Nexium .[name] KitchenPlease Call  He has questions    Thanks

## 2014-05-15 NOTE — Telephone Encounter (Signed)
Spoke with pt and told him he should be taking Pantoprazole due to being on Plavix. He is aware to stop Nexium.  [facility]. Will send in.

## 2014-05-28 ENCOUNTER — Ambulatory Visit (HOSPITAL_COMMUNITY)
Admission: RE | Admit: 2014-05-28 | Discharge: 2014-05-28 | Disposition: A | Discharge status: Home / Self Care | Payer: Medicare Other | Source: Ambulatory Visit | Attending: Cardiology | Admitting: Cardiology

## 2014-05-28 DIAGNOSIS — I739 Peripheral vascular disease, unspecified: Secondary | ICD-10-CM | POA: Diagnosis not present

## 2014-05-28 DIAGNOSIS — Z48812 Encounter for surgical aftercare following surgery on the circulatory system: Secondary | ICD-10-CM | POA: Diagnosis not present

## 2014-05-28 DIAGNOSIS — T82858D Stenosis of vascular prosthetic devices, implants and grafts, subsequent encounter: Secondary | ICD-10-CM | POA: Insufficient documentation

## 2014-05-28 DIAGNOSIS — I771 Stricture of artery: Secondary | ICD-10-CM | POA: Diagnosis not present

## 2014-05-28 NOTE — Progress Notes (Signed)
Lower Ext. Arterial Duplex Right Completed. Oda Cogan, BS, RDMS, RVT

## 2014-05-29 DIAGNOSIS — L602 Onychogryphosis: Secondary | ICD-10-CM | POA: Diagnosis not present

## 2014-05-29 DIAGNOSIS — E119 Type 2 diabetes mellitus without complications: Secondary | ICD-10-CM | POA: Diagnosis not present

## 2014-05-30 ENCOUNTER — Encounter: Payer: Self-pay | Admitting: *Deleted

## 2014-05-30 DIAGNOSIS — E1151 Type 2 diabetes mellitus with diabetic peripheral angiopathy without gangrene: Secondary | ICD-10-CM | POA: Diagnosis not present

## 2014-05-30 DIAGNOSIS — I10 Essential (primary) hypertension: Secondary | ICD-10-CM | POA: Diagnosis not present

## 2014-05-30 DIAGNOSIS — K219 Gastro-esophageal reflux disease without esophagitis: Secondary | ICD-10-CM | POA: Diagnosis not present

## 2014-05-30 DIAGNOSIS — M25561 Pain in right knee: Secondary | ICD-10-CM | POA: Diagnosis not present

## 2014-05-30 DIAGNOSIS — E785 Hyperlipidemia, unspecified: Secondary | ICD-10-CM | POA: Diagnosis not present

## 2014-05-30 DIAGNOSIS — Z6826 Body mass index (BMI) 26.0-26.9, adult: Secondary | ICD-10-CM | POA: Diagnosis not present

## 2014-05-30 DIAGNOSIS — M1990 Unspecified osteoarthritis, unspecified site: Secondary | ICD-10-CM | POA: Diagnosis not present

## 2014-05-30 DIAGNOSIS — M179 Osteoarthritis of knee, unspecified: Secondary | ICD-10-CM | POA: Diagnosis not present

## 2014-05-30 DIAGNOSIS — I739 Peripheral vascular disease, unspecified: Secondary | ICD-10-CM | POA: Diagnosis not present

## 2014-06-06 ENCOUNTER — Ambulatory Visit (INDEPENDENT_AMBULATORY_CARE_PROVIDER_SITE_OTHER): Payer: Medicare Other | Admitting: Cardiology

## 2014-06-06 ENCOUNTER — Encounter: Payer: Self-pay | Admitting: Cardiology

## 2014-06-06 VITALS — BP 98/49 | HR 116 | Ht 64.0 in | Wt 162.6 lb

## 2014-06-06 DIAGNOSIS — I779 Disorder of arteries and arterioles, unspecified: Secondary | ICD-10-CM | POA: Diagnosis not present

## 2014-06-06 DIAGNOSIS — I739 Peripheral vascular disease, unspecified: Secondary | ICD-10-CM | POA: Diagnosis not present

## 2014-06-06 DIAGNOSIS — I10 Essential (primary) hypertension: Secondary | ICD-10-CM

## 2014-06-06 DIAGNOSIS — E785 Hyperlipidemia, unspecified: Secondary | ICD-10-CM

## 2014-06-06 NOTE — Assessment & Plan Note (Signed)
Peripheral arterial disease, PAD (peripheral artery disease), hx remote stent Lt common iliac, 04/2010-stent to lt common iliac & 2 stents to the Rt SFA, 04/2014 - s/p PTA of right SFA for in-stent restenosis   Improvement with ABIs but no improvement in symptoms. He remains on Plavix and Pletal no aspirin secondary to GI bleed.  Discussed with Dr. Alvester Chou his continued symptoms he recommends follow-up with PCP for other reasons for leg pain he does not feel the pain now is due to circulation.  Patient will follow with Dr. Gwenlyn Found in 3 months

## 2014-06-06 NOTE — Assessment & Plan Note (Signed)
On crestor followed by PCP.

## 2014-06-06 NOTE — Patient Instructions (Signed)
Your physician recommends that you schedule a follow-up appointment in: 3 months with Dr Berry 

## 2014-06-06 NOTE — Assessment & Plan Note (Signed)
Treating medically.

## 2014-06-06 NOTE — Progress Notes (Signed)
06/06/2014   PCP: Tivis Ringer, MD   Chief Complaint  Patient presents with  . Follow-up    11/16 PV cath, 11/30LEA's    Primary Cardiologist:Dr. Adora Fridge   HPI:  73 y/o M with history of PVD s/p stenting, HTN, HLD, NIDDM, and negative myoview in the office 07/2010 who presented to [facility] for planned PV angiogram. His daughter-in-law is a primary care physician at [employer]. He has a history of stenting of his left common iliac artery 20 years ago. In 2011, Dr. Gwenlyn Found found high-grade left external iliac artery stenosis which he stented using a 7 mm x 3 cm long Nitinol self-expanding stent. He also put 2 stents in his right SFA. Followup Dopplers showed improvement but symptomatically he did not really improve much. He recently presented to the office with progressive claudication. Recent Dopplers performed 04/04/14 revealing a decline in his right ABI from 0.81-0.53 with progression of disease in his right SFA suggesting in-stent restenosis. Carotid Dopplers suggested moderately severe right internal carotid artery stenosis. He was brought in for angiography 05/14/14 showing: 1: Aortic arch angiography-bovine arch 2: Right carotid artery-50% proximal/hypodense right internal carotid artery stenosis. 3: Abdominal aortogram-widely patent renal arteries, normal infrarenal abdominal aorta. 4: Left lower extremity-patent left external iliac artery stent with mild in-stent restenosis, occluded left SFA with reconstitution in the adductor canal by profunda femoris femoris collaterals, 2 vessel runoff with an occluded posterior tibial 5: Right lower extremity-patent mid right SFA stent, high-grade "in-stent restenosis" within the distal right SFA stent with three-vessel runoff.  Successful PTA of right SFA nitinol in-stent restenosis with drug-eluting balloon. Dr. Gwenlyn Found plans to follow his carotid stenosis noninvasively.    Today the patient is back for follow-up. His follow-up  Dopplers showed improvement of his peripheral arterial disease ABIs on the right  were 0.53 now they have improved to 0.83.  Nonetheless the patient is disappointed as his symptoms have not improved at all.  He actually states despite 3 interventions the symptoms have never improved.  I did discuss that with Dr. Gwenlyn Found only recommend he sees his primary care for other causes of leg pain. Patient denies any back pain at all.  No chest pain and no shortness of breath.    Allergies  Allergen Reactions  . Asa [Aspirin] Other (See Comments)    GI bleeding    Current Outpatient Prescriptions  Medication Sig Dispense Refill  . cilostazol (PLETAL) 100 MG tablet Take 1 tablet (100 mg total) by mouth 2 (two) times daily. 180 tablet 3  . clopidogrel (PLAVIX) 75 MG tablet TAKE 1 TABLET DAILY 90 tablet 2  . co-enzyme Q-10 30 MG capsule Take 30 mg by mouth daily.    [name] glucosamine-chondroitin 500-400 MG tablet Take 1 tablet by mouth 2 (two) times daily.    . metFORMIN (GLUCOPHAGE) 1000 MG tablet Take 1 tablet (1,000 mg total) by mouth 2 (two) times daily with a meal.    . metoprolol succinate (TOPROL-XL) 25 MG 24 hr tablet Take 25 mg by mouth 2 (two) times daily.     . Omega-3 Fatty Acids (FISH OIL) 1000 MG CAPS Take 1 capsule (1,000 mg total) by mouth daily.  0  . pantoprazole (PROTONIX) 40 MG tablet Take 2 tablets (80 mg total) by mouth daily. 180 tablet 3  . rosuvastatin (CRESTOR) 20 MG tablet Take 20 mg by mouth daily.     No current facility-administered medications for this visit.  Past Medical History  Diagnosis Date  . Osteoarthritis   . Hyperlipemia   . GI bleed   . Anemia   . Hypertension   . History of colon polyps   . PAD (peripheral artery disease)     a. stenting of his left common iliac artery >20 years ago. b. h/o LEIA stent and 2 stents to R SFA in 2011. c. 04/2014:  s/p PTA of right SFA for in-stent restenosis, occluded left SFA  . RBBB (right bundle branch block with left  anterior fascicular block)     NUCLEAR STRESS TEST, 08/18/2010 - no significant wall motion abnoramlities noted, post-stress EF 69%, normal myocardial perfusion study  . Stenosis of carotid artery     a. 50% right carotid stenosis by angiogram 04/2014.  . Diabetes mellitus     TYPE 2  . GERD (gastroesophageal reflux disease)   . History of hiatal hernia   . Sinus tachycardia     a. Noted during admission 04/2014 but upon review seems to be frequent finding for patient.    Past Surgical History  Procedure Laterality Date  . Knee arthroscopy      left  . Angioplasty / stenting femoral    . Angioplasty / stenting iliac    . Rotator cuff repair Right 2003  . Femoral artery stent Right 05/12/2010    Stented distally with a 6x100 Abbott absolute stent and proximally with a 6x60 Cook Zilver stent resulting in the reduction of the proximal segment 80% and mid segment 60-70% to 0% residual, LEFT common femoral artery stented with a 7x3 Smart stent resulting in reduction of 90% stenosis to 0% residual  . Sfa Right 05/14/2014    PTA  OF RT SFA         DR BERRY    SVX:[id number]:SP colds or fevers, mild weight changes Skin:no rashes or ulcers HEENT:no blurred vision, no congestion CV:see HPI PUL:see HPI GI:no diarrhea constipation or melena, no indigestion GU:no hematuria, no dysuria MS:no joint pain, no claudication Neuro:no syncope, no lightheadedness Endo:+ diabetes, no thyroid disease  Wt Readings from Last 3 Encounters:  06/06/14 162 lb 9.6 oz (73.755 kg)  05/15/14 158 lb 8.2 oz (71.9 kg)  04/26/14 163 lb 11.2 oz (74.254 kg)    PHYSICAL EXAM BP 98/49 mmHg  Pulse 116  Ht 5\' 4"  (1.626 m)  Wt 162 lb 9.6 oz (73.755 kg)  BMI 27.90 kg/m2 General:Pleasant affect, NAD Skin:Warm and dry, brisk capillary refill HEENT:normocephalic, sclera clear, mucus membranes moist Neck:supple, no JVD, no bruits  Heart:S1S2 RRR without murmur, gallup, rub or click Lungs:clear without rales, rhonchi,  or wheezes QZR:AQTM, non tender, + BS, do not palpate liver spleen or masses Ext:no lower ext edema, 2+ radial pulses Neuro:alert and oriented, MAE, follows commands, + facial symmetry   ASSESSMENT AND PLAN PAD (peripheral artery disease), hx remote stent Lt common iliac, 04/2010-stent to lt common iliac & 2 stents to the Rt SFA, 04/2014 - s/p PTA of right SFA for in-stent restenosis  Peripheral arterial disease, PAD (peripheral artery disease), hx remote stent Lt common iliac, 04/2010-stent to lt common iliac & 2 stents to the Rt SFA, 04/2014 - s/p PTA of right SFA for in-stent restenosis   Improvement with ABIs but no improvement in symptoms. He remains on Plavix and Pletal no aspirin secondary to GI bleed.  Discussed with Dr. Alvester Chou his continued symptoms he recommends follow-up with PCP for other reasons for leg pain he does not feel the pain  now is due to circulation.  Patient will follow with Dr. Gwenlyn Found in 3 months   Essential hypertension Soft blood pressure today but no complaints no dizziness no lightheadedness.  Carotid artery disease Treating medically.  Hyperlipidemia On crestor followed by PCP.

## 2014-06-06 NOTE — Assessment & Plan Note (Signed)
Soft blood pressure today but no complaints no dizziness no lightheadedness.

## 2014-06-07 ENCOUNTER — Encounter (HOSPITAL_COMMUNITY): Payer: Self-pay | Admitting: Cardiovascular Disease

## 2014-06-07 DIAGNOSIS — H3532 Exudative age-related macular degeneration: Secondary | ICD-10-CM | POA: Diagnosis not present

## 2014-06-07 DIAGNOSIS — H35052 Retinal neovascularization, unspecified, left eye: Secondary | ICD-10-CM | POA: Diagnosis not present

## 2014-06-07 DIAGNOSIS — H27112 Subluxation of lens, left eye: Secondary | ICD-10-CM | POA: Diagnosis not present

## 2014-06-12 ENCOUNTER — Ambulatory Visit: Payer: Medicare Other | Admitting: Physician Assistant

## 2014-06-14 DIAGNOSIS — H3532 Exudative age-related macular degeneration: Secondary | ICD-10-CM | POA: Diagnosis not present

## 2014-06-14 DIAGNOSIS — H35051 Retinal neovascularization, unspecified, right eye: Secondary | ICD-10-CM | POA: Diagnosis not present

## 2014-07-02 ENCOUNTER — Other Ambulatory Visit: Payer: Self-pay | Admitting: Cardiovascular Disease

## 2014-07-02 NOTE — Telephone Encounter (Signed)
Rx(s) sent to pharmacy electronically.  

## 2014-07-12 DIAGNOSIS — H26492 Other secondary cataract, left eye: Secondary | ICD-10-CM | POA: Diagnosis not present

## 2014-07-12 DIAGNOSIS — H35052 Retinal neovascularization, unspecified, left eye: Secondary | ICD-10-CM | POA: Diagnosis not present

## 2014-07-12 DIAGNOSIS — H3532 Exudative age-related macular degeneration: Secondary | ICD-10-CM | POA: Diagnosis not present

## 2014-07-19 DIAGNOSIS — H3532 Exudative age-related macular degeneration: Secondary | ICD-10-CM | POA: Diagnosis not present

## 2014-07-19 DIAGNOSIS — H35051 Retinal neovascularization, unspecified, right eye: Secondary | ICD-10-CM | POA: Diagnosis not present

## 2014-08-14 DIAGNOSIS — H26492 Other secondary cataract, left eye: Secondary | ICD-10-CM | POA: Diagnosis not present

## 2014-08-23 DIAGNOSIS — H35052 Retinal neovascularization, unspecified, left eye: Secondary | ICD-10-CM | POA: Diagnosis not present

## 2014-08-23 DIAGNOSIS — H3532 Exudative age-related macular degeneration: Secondary | ICD-10-CM | POA: Diagnosis not present

## 2014-08-28 DIAGNOSIS — L602 Onychogryphosis: Secondary | ICD-10-CM | POA: Diagnosis not present

## 2014-08-28 DIAGNOSIS — E1151 Type 2 diabetes mellitus with diabetic peripheral angiopathy without gangrene: Secondary | ICD-10-CM | POA: Diagnosis not present

## 2014-08-30 DIAGNOSIS — H3532 Exudative age-related macular degeneration: Secondary | ICD-10-CM | POA: Diagnosis not present

## 2014-08-30 DIAGNOSIS — H35051 Retinal neovascularization, unspecified, right eye: Secondary | ICD-10-CM | POA: Diagnosis not present

## 2014-09-10 ENCOUNTER — Other Ambulatory Visit: Payer: Self-pay | Admitting: Cardiovascular Disease

## 2014-09-11 ENCOUNTER — Ambulatory Visit: Payer: Medicare Other | Admitting: Cardiovascular Disease

## 2014-09-12 ENCOUNTER — Encounter: Payer: Self-pay | Admitting: Cardiovascular Disease

## 2014-09-12 ENCOUNTER — Ambulatory Visit (INDEPENDENT_AMBULATORY_CARE_PROVIDER_SITE_OTHER): Payer: Medicare Other | Admitting: Cardiovascular Disease

## 2014-09-12 VITALS — BP 118/50 | HR 82 | Ht 64.0 in | Wt 161.8 lb

## 2014-09-12 DIAGNOSIS — I779 Disorder of arteries and arterioles, unspecified: Secondary | ICD-10-CM | POA: Diagnosis not present

## 2014-09-12 DIAGNOSIS — I739 Peripheral vascular disease, unspecified: Secondary | ICD-10-CM

## 2014-09-12 DIAGNOSIS — I10 Essential (primary) hypertension: Secondary | ICD-10-CM | POA: Diagnosis not present

## 2014-09-12 DIAGNOSIS — E785 Hyperlipidemia, unspecified: Secondary | ICD-10-CM

## 2014-09-12 NOTE — Assessment & Plan Note (Signed)
History of hyperlipidemia on rosuvastatin 20 mg a day followed by his PCP. °

## 2014-09-12 NOTE — Assessment & Plan Note (Signed)
History of carotid artery disease with Doppler that suggested moderately severe right internal carotid artery stenosis though by angiography performed on 05/14/14 he only had a 50% hypodense right internal carotid artery stenosis and a bovine arch. We'll continue to follow this noninvasively. His neurologically asymptomatic.

## 2014-09-12 NOTE — Patient Instructions (Signed)
Your physician wants you to follow-up in: 1 year with Dr Gwenlyn Found.  You will receive a reminder letter in the mail two months in advance. If you don't receive a letter, please call our office to schedule the follow-up appointment.  Stop Pletal

## 2014-09-12 NOTE — Assessment & Plan Note (Signed)
History of hypertension with blood pressure measured 118/50. He is on metoprolol. Continue current meds at current dosing

## 2014-09-12 NOTE — Assessment & Plan Note (Signed)
History of PAD status post remote stenting of the left iliac and right SFA. He has a known occluded left SFA. Because of a drop in his ankle brachial index on the right side I re-angiogram. 05/14/14 revealing a patent left external iliac artery stent at most 30-40% "in-stent restenosis", patent proximal right SFA stent with diffuse 95% "in-stent restenosis within the distal right SFA stent. There was three-vessel runoff on the right and 2 vessel runoff on the left with an occluded posterior tibial. I performed PTCA with drug-eluting balloon of the distal right SFA stent resulting in excellent angiographic and resolved with improvement in his ABI on the symptoms did not improve. He continues to have atypical right thigh and calf pain. He is on Plavix and Pletal. I'm not sure because of the admission beneficial effect and therefore have asked him to stop this drug and monitor his symptoms. We will continue to follow his lower extremity arterial Doppler studies.

## 2014-09-12 NOTE — Progress Notes (Signed)
09/12/2014 Christian Bailey   04/08/1941  [id number]  Primary Physician Tivis Ringer, MD Primary Cardiologist: Lorretta Harp MD [name]   HPI:  The patient is a very pleasant 74 year old, married Caucasian male, father of 2, grandfather to 1 grandchild who I last saw 101months ago.His daughter-in-law is a primary care physician at [employer]. He has a history of PVOD and stenting of his left common iliac artery 20 years ago. He was smoking 3 packs a day at that time but stopped smoking 20 years ago.   His other problems include hypertension, hyperlipidemia and noninsulin-requiring diabetes. He had a negative Myoview in our office August 18, 2010. Because of claudication, I performed angiograph on him May 12, 2010, revealing an intact left common iliac artery stent, high-grade left external iliac artery stenosis which I stented using a 7 mm x 3 cm long Nitinol self-expanding stent. I also put 2 stents in his right SFA. Followup Dopplers showed improvement but symptomatically he did not really improve much. He still complains of predictable bilateral lower extremity claudication at several hundred yards. His Dopplers have remained stable. Dr. Dagmar Hait follows his lipid profile which was recently done earlier this month revealing a total cholesterol of 153, LDL of 86 and HDL of 34. Recent lower with Doppler studies performed in October 2014 revealed patent stents with a right ABI of 0.81 and a left of 0.67.over the past year he's noticed progressive but without limiting claudication with recent Dopplers performed 04/04/14 revealing a decline in his right ABI from 0.81-0.53 with progression of disease in his right SFA suggesting "in-stent restenosis.I also performed carotid Doppler studies that suggested moderately severe right internal carotid artery stenosis.   As a result of these studies I performed angiography on him 05/14/14 revealing a patent left external iliac artery stent with  at most 30-40% "in-stent restenosis, and known occluded left SFA with two-vessel runoff (occluded posterior tibial), patent proximal right SFA stent with diffuse "95% in-stent restenosis" within the distal right SFA stent. He had three-vessel runoff on the right. I performed a drug eluting balloon angioplasty for the in-stent restenosis with excellent angiographic and Doppler result although his symptoms did not significantly improve. I also angiogram his carotids because of a moderately severe right ICA stenosis by duplex ultrasound revealing a 50% hypodense right internal carotid artery stenosis with a bovine arch. We have continued to follow these noninvasively. He is neurologically asymptomatic. He denies chest pain or shortness of breath.   Current Outpatient Prescriptions  Medication Sig Dispense Refill  . cilostazol (PLETAL) 100 MG tablet TAKE 1 TABLET TWICE A DAY 180 tablet 2  . clopidogrel (PLAVIX) 75 MG tablet Take 1 tablet (75 mg total) by mouth daily. 90 tablet 2  . co-enzyme Q-10 30 MG capsule Take 30 mg by mouth daily.    [name] glucosamine-chondroitin 500-400 MG tablet Take 1 tablet by mouth 2 (two) times daily.    . metFORMIN (GLUCOPHAGE) 1000 MG tablet Take 1 tablet (1,000 mg total) by mouth 2 (two) times daily with a meal.    . metoprolol succinate (TOPROL-XL) 25 MG 24 hr tablet Take 25 mg by mouth 2 (two) times daily.     . Omega-3 Fatty Acids (FISH OIL) 1000 MG CAPS Take 1 capsule (1,000 mg total) by mouth daily.  0  . pantoprazole (PROTONIX) 40 MG tablet Take 2 tablets (80 mg total) by mouth daily. 180 tablet 3  . rosuvastatin (CRESTOR) 20 MG tablet Take 20 mg by  mouth daily.     No current facility-administered medications for this visit.    Allergies  Allergen Reactions  . Asa [Aspirin] Other (See Comments)    GI bleeding    History   Social History  . Marital Status: Married    Spouse Name: N/A  . Number of Children: 2  . Years of Education: N/A   Occupational  History  . retired   .     Social History Main Topics  . Smoking status: Former Research scientist (life sciences)  . Smokeless tobacco: Never Used  . Alcohol Use: Yes     Comment: rarely  . Drug Use: No  . Sexual Activity: Not on file   Other Topics Concern  . Not on file   Social History Narrative     Review of Systems: General: negative for chills, fever, night sweats or weight changes.  Cardiovascular: negative for chest pain, dyspnea on exertion, edema, orthopnea, palpitations, paroxysmal nocturnal dyspnea or shortness of breath Dermatological: negative for rash Respiratory: negative for cough or wheezing Urologic: negative for hematuria Abdominal: negative for nausea, vomiting, diarrhea, bright red blood per rectum, melena, or hematemesis Neurologic: negative for visual changes, syncope, or dizziness All other systems reviewed and are otherwise negative except as noted above.    Blood pressure 118/50, pulse 82, height 5\' 4"  (1.626 m), weight 161 lb 12.8 oz (73.392 kg).  General appearance: alert and no distress Neck: no adenopathy, no carotid bruit, no JVD, supple, symmetrical, trachea midline and thyroid not enlarged, symmetric, no tenderness/mass/nodules Lungs: clear to auscultation bilaterally Heart: regular rate and rhythm, S1, S2 normal, no murmur, click, rub or gallop Extremities: extremities normal, atraumatic, no cyanosis or edema  EKG not performed today  ASSESSMENT AND PLAN:   PAD (peripheral artery disease), hx remote stent Lt common iliac, 04/2010-stent to lt common iliac & 2 stents to the Rt SFA, 04/2014 - s/p PTA of right SFA for in-stent restenosis  History of PAD status post remote stenting of the left iliac and right SFA. He has a known occluded left SFA. Because of a drop in his ankle brachial index on the right side I re-angiogram. 05/14/14 revealing a patent left external iliac artery stent at most 30-40% "in-stent restenosis", patent proximal right SFA stent with diffuse 95%  "in-stent restenosis within the distal right SFA stent. There was three-vessel runoff on the right and 2 vessel runoff on the left with an occluded posterior tibial. I performed PTCA with drug-eluting balloon of the distal right SFA stent resulting in excellent angiographic and resolved with improvement in his ABI on the symptoms did not improve. He continues to have atypical right thigh and calf pain. He is on Plavix and Pletal. I'm not sure because of the admission beneficial effect and therefore have asked him to stop this drug and monitor his symptoms. We will continue to follow his lower extremity arterial Doppler studies.   Hyperlipidemia History of hyperlipidemia on rosuvastatin 20 mg a day followed by his PCP   Essential hypertension History of hypertension with blood pressure measured 118/50. He is on metoprolol. Continue current meds at current dosing   Carotid artery disease History of carotid artery disease with Doppler that suggested moderately severe right internal carotid artery stenosis though by angiography performed on 05/14/14 he only had a 50% hypodense right internal carotid artery stenosis and a bovine arch. We'll continue to follow this noninvasively. His neurologically asymptomatic.       Lorretta Harp MD FACP,FACC,FAHA, [facility] 09/12/2014 9:30  AM

## 2014-10-04 DIAGNOSIS — I10 Essential (primary) hypertension: Secondary | ICD-10-CM | POA: Diagnosis not present

## 2014-10-04 DIAGNOSIS — M25561 Pain in right knee: Secondary | ICD-10-CM | POA: Diagnosis not present

## 2014-10-04 DIAGNOSIS — Z6826 Body mass index (BMI) 26.0-26.9, adult: Secondary | ICD-10-CM | POA: Diagnosis not present

## 2014-10-04 DIAGNOSIS — I739 Peripheral vascular disease, unspecified: Secondary | ICD-10-CM | POA: Diagnosis not present

## 2014-10-09 DIAGNOSIS — M2241 Chondromalacia patellae, right knee: Secondary | ICD-10-CM | POA: Diagnosis not present

## 2014-10-09 DIAGNOSIS — M7121 Synovial cyst of popliteal space [Baker], right knee: Secondary | ICD-10-CM | POA: Diagnosis not present

## 2014-10-09 DIAGNOSIS — M25561 Pain in right knee: Secondary | ICD-10-CM | POA: Diagnosis not present

## 2014-10-10 ENCOUNTER — Telehealth (HOSPITAL_COMMUNITY): Payer: Self-pay | Admitting: *Deleted

## 2014-10-11 ENCOUNTER — Other Ambulatory Visit (HOSPITAL_COMMUNITY): Payer: Self-pay | Admitting: Cardiovascular Disease

## 2014-10-11 DIAGNOSIS — R0989 Other specified symptoms and signs involving the circulatory and respiratory systems: Secondary | ICD-10-CM

## 2014-10-11 DIAGNOSIS — I739 Peripheral vascular disease, unspecified: Secondary | ICD-10-CM

## 2014-10-11 DIAGNOSIS — H3532 Exudative age-related macular degeneration: Secondary | ICD-10-CM | POA: Diagnosis not present

## 2014-10-18 DIAGNOSIS — H3532 Exudative age-related macular degeneration: Secondary | ICD-10-CM | POA: Diagnosis not present

## 2014-10-22 ENCOUNTER — Other Ambulatory Visit (HOSPITAL_COMMUNITY): Payer: Self-pay | Admitting: Cardiovascular Disease

## 2014-10-22 DIAGNOSIS — I6521 Occlusion and stenosis of right carotid artery: Secondary | ICD-10-CM

## 2014-10-30 DIAGNOSIS — L602 Onychogryphosis: Secondary | ICD-10-CM | POA: Diagnosis not present

## 2014-10-30 DIAGNOSIS — E1151 Type 2 diabetes mellitus with diabetic peripheral angiopathy without gangrene: Secondary | ICD-10-CM | POA: Diagnosis not present

## 2014-11-26 ENCOUNTER — Encounter (HOSPITAL_COMMUNITY): Payer: TRICARE For Life (TFL)

## 2014-11-29 ENCOUNTER — Other Ambulatory Visit: Payer: Self-pay | Admitting: Cardiovascular Disease

## 2014-11-29 ENCOUNTER — Ambulatory Visit (HOSPITAL_BASED_OUTPATIENT_CLINIC_OR_DEPARTMENT_OTHER)
Admission: RE | Admit: 2014-11-29 | Discharge: 2014-11-29 | Disposition: A | Discharge status: Home / Self Care | Payer: Medicare Other | Source: Ambulatory Visit | Attending: Cardiovascular Disease | Admitting: Cardiovascular Disease

## 2014-11-29 ENCOUNTER — Ambulatory Visit (HOSPITAL_COMMUNITY)
Admission: RE | Admit: 2014-11-29 | Discharge: 2014-11-29 | Disposition: A | Discharge status: Home / Self Care | Payer: Medicare Other | Source: Ambulatory Visit | Attending: Cardiovascular Disease | Admitting: Cardiovascular Disease

## 2014-11-29 DIAGNOSIS — I6521 Occlusion and stenosis of right carotid artery: Secondary | ICD-10-CM | POA: Diagnosis not present

## 2014-11-29 DIAGNOSIS — I739 Peripheral vascular disease, unspecified: Secondary | ICD-10-CM

## 2014-12-06 DIAGNOSIS — H35352 Cystoid macular degeneration, left eye: Secondary | ICD-10-CM | POA: Diagnosis not present

## 2014-12-06 DIAGNOSIS — H3532 Exudative age-related macular degeneration: Secondary | ICD-10-CM | POA: Diagnosis not present

## 2014-12-13 DIAGNOSIS — H3532 Exudative age-related macular degeneration: Secondary | ICD-10-CM | POA: Diagnosis not present

## 2015-01-07 DIAGNOSIS — Z125 Encounter for screening for malignant neoplasm of prostate: Secondary | ICD-10-CM | POA: Diagnosis not present

## 2015-01-07 DIAGNOSIS — E785 Hyperlipidemia, unspecified: Secondary | ICD-10-CM | POA: Diagnosis not present

## 2015-01-07 DIAGNOSIS — I2510 Atherosclerotic heart disease of native coronary artery without angina pectoris: Secondary | ICD-10-CM | POA: Diagnosis not present

## 2015-01-07 DIAGNOSIS — I10 Essential (primary) hypertension: Secondary | ICD-10-CM | POA: Diagnosis not present

## 2015-01-07 DIAGNOSIS — E1151 Type 2 diabetes mellitus with diabetic peripheral angiopathy without gangrene: Secondary | ICD-10-CM | POA: Diagnosis not present

## 2015-01-14 DIAGNOSIS — E1151 Type 2 diabetes mellitus with diabetic peripheral angiopathy without gangrene: Secondary | ICD-10-CM | POA: Diagnosis not present

## 2015-01-14 DIAGNOSIS — I10 Essential (primary) hypertension: Secondary | ICD-10-CM | POA: Diagnosis not present

## 2015-01-14 DIAGNOSIS — N182 Chronic kidney disease, stage 2 (mild): Secondary | ICD-10-CM | POA: Diagnosis not present

## 2015-01-14 DIAGNOSIS — I2510 Atherosclerotic heart disease of native coronary artery without angina pectoris: Secondary | ICD-10-CM | POA: Diagnosis not present

## 2015-01-14 DIAGNOSIS — Z23 Encounter for immunization: Secondary | ICD-10-CM | POA: Diagnosis not present

## 2015-01-14 DIAGNOSIS — K279 Peptic ulcer, site unspecified, unspecified as acute or chronic, without hemorrhage or perforation: Secondary | ICD-10-CM | POA: Diagnosis not present

## 2015-01-14 DIAGNOSIS — I739 Peripheral vascular disease, unspecified: Secondary | ICD-10-CM | POA: Diagnosis not present

## 2015-01-14 DIAGNOSIS — Z6826 Body mass index (BMI) 26.0-26.9, adult: Secondary | ICD-10-CM | POA: Diagnosis not present

## 2015-01-14 DIAGNOSIS — E785 Hyperlipidemia, unspecified: Secondary | ICD-10-CM | POA: Diagnosis not present

## 2015-01-14 DIAGNOSIS — H3530 Unspecified macular degeneration: Secondary | ICD-10-CM | POA: Diagnosis not present

## 2015-01-14 DIAGNOSIS — Z0000 Encounter for general adult medical examination without abnormal findings: Secondary | ICD-10-CM | POA: Diagnosis not present

## 2015-01-14 DIAGNOSIS — I4510 Unspecified right bundle-branch block: Secondary | ICD-10-CM | POA: Diagnosis not present

## 2015-01-14 DIAGNOSIS — Z1389 Encounter for screening for other disorder: Secondary | ICD-10-CM | POA: Diagnosis not present

## 2015-01-15 DIAGNOSIS — Z1212 Encounter for screening for malignant neoplasm of rectum: Secondary | ICD-10-CM | POA: Diagnosis not present

## 2015-01-16 DIAGNOSIS — L602 Onychogryphosis: Secondary | ICD-10-CM | POA: Diagnosis not present

## 2015-01-16 DIAGNOSIS — L84 Corns and callosities: Secondary | ICD-10-CM | POA: Diagnosis not present

## 2015-01-16 DIAGNOSIS — E1151 Type 2 diabetes mellitus with diabetic peripheral angiopathy without gangrene: Secondary | ICD-10-CM | POA: Diagnosis not present

## 2015-02-06 DIAGNOSIS — H3532 Exudative age-related macular degeneration: Secondary | ICD-10-CM | POA: Diagnosis not present

## 2015-02-14 DIAGNOSIS — H3532 Exudative age-related macular degeneration: Secondary | ICD-10-CM | POA: Diagnosis not present

## 2015-03-10 ENCOUNTER — Other Ambulatory Visit: Payer: Self-pay | Admitting: Cardiovascular Disease

## 2015-03-11 NOTE — Telephone Encounter (Signed)
Rx request sent to pharmacy.  

## 2015-04-01 ENCOUNTER — Other Ambulatory Visit: Payer: Self-pay | Admitting: Cardiovascular Disease

## 2015-04-01 NOTE — Telephone Encounter (Signed)
Rx request sent to pharmacy.  

## 2015-04-24 DIAGNOSIS — H353221 Exudative age-related macular degeneration, left eye, with active choroidal neovascularization: Secondary | ICD-10-CM | POA: Diagnosis not present

## 2015-04-25 DIAGNOSIS — E1151 Type 2 diabetes mellitus with diabetic peripheral angiopathy without gangrene: Secondary | ICD-10-CM | POA: Diagnosis not present

## 2015-04-25 DIAGNOSIS — L602 Onychogryphosis: Secondary | ICD-10-CM | POA: Diagnosis not present

## 2015-04-25 DIAGNOSIS — L84 Corns and callosities: Secondary | ICD-10-CM | POA: Diagnosis not present

## 2015-05-02 DIAGNOSIS — H353211 Exudative age-related macular degeneration, right eye, with active choroidal neovascularization: Secondary | ICD-10-CM | POA: Diagnosis not present

## 2015-05-02 DIAGNOSIS — E119 Type 2 diabetes mellitus without complications: Secondary | ICD-10-CM | POA: Diagnosis not present

## 2015-05-02 DIAGNOSIS — H27122 Anterior dislocation of lens, left eye: Secondary | ICD-10-CM | POA: Diagnosis not present

## 2015-05-02 DIAGNOSIS — H353132 Nonexudative age-related macular degeneration, bilateral, intermediate dry stage: Secondary | ICD-10-CM | POA: Diagnosis not present

## 2015-05-02 DIAGNOSIS — H4302 Vitreous prolapse, left eye: Secondary | ICD-10-CM | POA: Diagnosis not present

## 2015-05-16 DIAGNOSIS — H4302 Vitreous prolapse, left eye: Secondary | ICD-10-CM | POA: Diagnosis not present

## 2015-05-31 ENCOUNTER — Other Ambulatory Visit: Payer: Self-pay | Admitting: Cardiovascular Disease

## 2015-05-31 DIAGNOSIS — I6523 Occlusion and stenosis of bilateral carotid arteries: Secondary | ICD-10-CM

## 2015-06-04 ENCOUNTER — Ambulatory Visit (HOSPITAL_COMMUNITY)
Admission: RE | Admit: 2015-06-04 | Discharge: 2015-06-04 | Disposition: A | Discharge status: Home / Self Care | Payer: Medicare Other | Source: Ambulatory Visit | Attending: Cardiology | Admitting: Cardiology

## 2015-06-04 DIAGNOSIS — I6523 Occlusion and stenosis of bilateral carotid arteries: Secondary | ICD-10-CM | POA: Diagnosis not present

## 2015-06-04 DIAGNOSIS — I10 Essential (primary) hypertension: Secondary | ICD-10-CM | POA: Diagnosis not present

## 2015-06-04 DIAGNOSIS — E785 Hyperlipidemia, unspecified: Secondary | ICD-10-CM | POA: Insufficient documentation

## 2015-06-04 DIAGNOSIS — E119 Type 2 diabetes mellitus without complications: Secondary | ICD-10-CM | POA: Diagnosis not present

## 2015-07-02 DIAGNOSIS — Z23 Encounter for immunization: Secondary | ICD-10-CM | POA: Diagnosis not present

## 2015-07-03 DIAGNOSIS — L602 Onychogryphosis: Secondary | ICD-10-CM | POA: Diagnosis not present

## 2015-07-03 DIAGNOSIS — L84 Corns and callosities: Secondary | ICD-10-CM | POA: Diagnosis not present

## 2015-07-03 DIAGNOSIS — I70293 Other atherosclerosis of native arteries of extremities, bilateral legs: Secondary | ICD-10-CM | POA: Diagnosis not present

## 2015-07-03 DIAGNOSIS — E1351 Other specified diabetes mellitus with diabetic peripheral angiopathy without gangrene: Secondary | ICD-10-CM | POA: Diagnosis not present

## 2015-07-19 DIAGNOSIS — I10 Essential (primary) hypertension: Secondary | ICD-10-CM | POA: Diagnosis not present

## 2015-07-19 DIAGNOSIS — I739 Peripheral vascular disease, unspecified: Secondary | ICD-10-CM | POA: Diagnosis not present

## 2015-07-19 DIAGNOSIS — Z6827 Body mass index (BMI) 27.0-27.9, adult: Secondary | ICD-10-CM | POA: Diagnosis not present

## 2015-07-19 DIAGNOSIS — K279 Peptic ulcer, site unspecified, unspecified as acute or chronic, without hemorrhage or perforation: Secondary | ICD-10-CM | POA: Diagnosis not present

## 2015-07-19 DIAGNOSIS — M1990 Unspecified osteoarthritis, unspecified site: Secondary | ICD-10-CM | POA: Diagnosis not present

## 2015-07-19 DIAGNOSIS — E1151 Type 2 diabetes mellitus with diabetic peripheral angiopathy without gangrene: Secondary | ICD-10-CM | POA: Diagnosis not present

## 2015-07-19 DIAGNOSIS — E784 Other hyperlipidemia: Secondary | ICD-10-CM | POA: Diagnosis not present

## 2015-07-24 DIAGNOSIS — H353211 Exudative age-related macular degeneration, right eye, with active choroidal neovascularization: Secondary | ICD-10-CM | POA: Diagnosis not present

## 2015-09-18 DIAGNOSIS — H4302 Vitreous prolapse, left eye: Secondary | ICD-10-CM | POA: Diagnosis not present

## 2015-09-18 DIAGNOSIS — H27122 Anterior dislocation of lens, left eye: Secondary | ICD-10-CM | POA: Diagnosis not present

## 2015-09-18 DIAGNOSIS — H353132 Nonexudative age-related macular degeneration, bilateral, intermediate dry stage: Secondary | ICD-10-CM | POA: Diagnosis not present

## 2015-09-18 DIAGNOSIS — H353212 Exudative age-related macular degeneration, right eye, with inactive choroidal neovascularization: Secondary | ICD-10-CM | POA: Diagnosis not present

## 2015-09-18 DIAGNOSIS — H35352 Cystoid macular degeneration, left eye: Secondary | ICD-10-CM | POA: Diagnosis not present

## 2015-09-24 DIAGNOSIS — E1151 Type 2 diabetes mellitus with diabetic peripheral angiopathy without gangrene: Secondary | ICD-10-CM | POA: Diagnosis not present

## 2015-09-24 DIAGNOSIS — L602 Onychogryphosis: Secondary | ICD-10-CM | POA: Diagnosis not present

## 2015-10-02 DIAGNOSIS — M9907 Segmental and somatic dysfunction of upper extremity: Secondary | ICD-10-CM | POA: Diagnosis not present

## 2015-10-02 DIAGNOSIS — M9902 Segmental and somatic dysfunction of thoracic region: Secondary | ICD-10-CM | POA: Diagnosis not present

## 2015-10-02 DIAGNOSIS — M9901 Segmental and somatic dysfunction of cervical region: Secondary | ICD-10-CM | POA: Diagnosis not present

## 2015-10-02 DIAGNOSIS — S43421A Sprain of right rotator cuff capsule, initial encounter: Secondary | ICD-10-CM | POA: Diagnosis not present

## 2015-10-07 DIAGNOSIS — M9901 Segmental and somatic dysfunction of cervical region: Secondary | ICD-10-CM | POA: Diagnosis not present

## 2015-10-07 DIAGNOSIS — M9902 Segmental and somatic dysfunction of thoracic region: Secondary | ICD-10-CM | POA: Diagnosis not present

## 2015-10-07 DIAGNOSIS — M9907 Segmental and somatic dysfunction of upper extremity: Secondary | ICD-10-CM | POA: Diagnosis not present

## 2015-10-07 DIAGNOSIS — S43421A Sprain of right rotator cuff capsule, initial encounter: Secondary | ICD-10-CM | POA: Diagnosis not present

## 2015-10-08 DIAGNOSIS — M9907 Segmental and somatic dysfunction of upper extremity: Secondary | ICD-10-CM | POA: Diagnosis not present

## 2015-10-08 DIAGNOSIS — S43421A Sprain of right rotator cuff capsule, initial encounter: Secondary | ICD-10-CM | POA: Diagnosis not present

## 2015-10-08 DIAGNOSIS — M9901 Segmental and somatic dysfunction of cervical region: Secondary | ICD-10-CM | POA: Diagnosis not present

## 2015-10-08 DIAGNOSIS — M9902 Segmental and somatic dysfunction of thoracic region: Secondary | ICD-10-CM | POA: Diagnosis not present

## 2015-10-09 DIAGNOSIS — M9901 Segmental and somatic dysfunction of cervical region: Secondary | ICD-10-CM | POA: Diagnosis not present

## 2015-10-09 DIAGNOSIS — M9907 Segmental and somatic dysfunction of upper extremity: Secondary | ICD-10-CM | POA: Diagnosis not present

## 2015-10-09 DIAGNOSIS — M9902 Segmental and somatic dysfunction of thoracic region: Secondary | ICD-10-CM | POA: Diagnosis not present

## 2015-10-09 DIAGNOSIS — S43421A Sprain of right rotator cuff capsule, initial encounter: Secondary | ICD-10-CM | POA: Diagnosis not present

## 2015-10-10 DIAGNOSIS — M9902 Segmental and somatic dysfunction of thoracic region: Secondary | ICD-10-CM | POA: Diagnosis not present

## 2015-10-10 DIAGNOSIS — M9907 Segmental and somatic dysfunction of upper extremity: Secondary | ICD-10-CM | POA: Diagnosis not present

## 2015-10-10 DIAGNOSIS — M9901 Segmental and somatic dysfunction of cervical region: Secondary | ICD-10-CM | POA: Diagnosis not present

## 2015-10-10 DIAGNOSIS — S43421A Sprain of right rotator cuff capsule, initial encounter: Secondary | ICD-10-CM | POA: Diagnosis not present

## 2015-10-14 DIAGNOSIS — M9902 Segmental and somatic dysfunction of thoracic region: Secondary | ICD-10-CM | POA: Diagnosis not present

## 2015-10-14 DIAGNOSIS — S43421A Sprain of right rotator cuff capsule, initial encounter: Secondary | ICD-10-CM | POA: Diagnosis not present

## 2015-10-14 DIAGNOSIS — M9907 Segmental and somatic dysfunction of upper extremity: Secondary | ICD-10-CM | POA: Diagnosis not present

## 2015-10-14 DIAGNOSIS — M9901 Segmental and somatic dysfunction of cervical region: Secondary | ICD-10-CM | POA: Diagnosis not present

## 2015-10-15 DIAGNOSIS — S43421A Sprain of right rotator cuff capsule, initial encounter: Secondary | ICD-10-CM | POA: Diagnosis not present

## 2015-10-15 DIAGNOSIS — M9902 Segmental and somatic dysfunction of thoracic region: Secondary | ICD-10-CM | POA: Diagnosis not present

## 2015-10-15 DIAGNOSIS — M9901 Segmental and somatic dysfunction of cervical region: Secondary | ICD-10-CM | POA: Diagnosis not present

## 2015-10-15 DIAGNOSIS — M9907 Segmental and somatic dysfunction of upper extremity: Secondary | ICD-10-CM | POA: Diagnosis not present

## 2015-10-16 DIAGNOSIS — M9907 Segmental and somatic dysfunction of upper extremity: Secondary | ICD-10-CM | POA: Diagnosis not present

## 2015-10-16 DIAGNOSIS — M9902 Segmental and somatic dysfunction of thoracic region: Secondary | ICD-10-CM | POA: Diagnosis not present

## 2015-10-16 DIAGNOSIS — S43421A Sprain of right rotator cuff capsule, initial encounter: Secondary | ICD-10-CM | POA: Diagnosis not present

## 2015-10-16 DIAGNOSIS — M9901 Segmental and somatic dysfunction of cervical region: Secondary | ICD-10-CM | POA: Diagnosis not present

## 2015-10-21 DIAGNOSIS — M9901 Segmental and somatic dysfunction of cervical region: Secondary | ICD-10-CM | POA: Diagnosis not present

## 2015-10-21 DIAGNOSIS — M9902 Segmental and somatic dysfunction of thoracic region: Secondary | ICD-10-CM | POA: Diagnosis not present

## 2015-10-21 DIAGNOSIS — M9907 Segmental and somatic dysfunction of upper extremity: Secondary | ICD-10-CM | POA: Diagnosis not present

## 2015-10-21 DIAGNOSIS — S43421A Sprain of right rotator cuff capsule, initial encounter: Secondary | ICD-10-CM | POA: Diagnosis not present

## 2015-10-22 DIAGNOSIS — S43421A Sprain of right rotator cuff capsule, initial encounter: Secondary | ICD-10-CM | POA: Diagnosis not present

## 2015-10-22 DIAGNOSIS — M9902 Segmental and somatic dysfunction of thoracic region: Secondary | ICD-10-CM | POA: Diagnosis not present

## 2015-10-22 DIAGNOSIS — M9901 Segmental and somatic dysfunction of cervical region: Secondary | ICD-10-CM | POA: Diagnosis not present

## 2015-10-22 DIAGNOSIS — M9907 Segmental and somatic dysfunction of upper extremity: Secondary | ICD-10-CM | POA: Diagnosis not present

## 2015-10-23 DIAGNOSIS — M9901 Segmental and somatic dysfunction of cervical region: Secondary | ICD-10-CM | POA: Diagnosis not present

## 2015-10-23 DIAGNOSIS — M9907 Segmental and somatic dysfunction of upper extremity: Secondary | ICD-10-CM | POA: Diagnosis not present

## 2015-10-23 DIAGNOSIS — M9902 Segmental and somatic dysfunction of thoracic region: Secondary | ICD-10-CM | POA: Diagnosis not present

## 2015-10-23 DIAGNOSIS — S43421A Sprain of right rotator cuff capsule, initial encounter: Secondary | ICD-10-CM | POA: Diagnosis not present

## 2015-10-24 DIAGNOSIS — H353132 Nonexudative age-related macular degeneration, bilateral, intermediate dry stage: Secondary | ICD-10-CM | POA: Diagnosis not present

## 2015-10-24 DIAGNOSIS — H353221 Exudative age-related macular degeneration, left eye, with active choroidal neovascularization: Secondary | ICD-10-CM | POA: Diagnosis not present

## 2015-10-24 DIAGNOSIS — H353211 Exudative age-related macular degeneration, right eye, with active choroidal neovascularization: Secondary | ICD-10-CM | POA: Diagnosis not present

## 2015-10-28 DIAGNOSIS — M9902 Segmental and somatic dysfunction of thoracic region: Secondary | ICD-10-CM | POA: Diagnosis not present

## 2015-10-28 DIAGNOSIS — M9907 Segmental and somatic dysfunction of upper extremity: Secondary | ICD-10-CM | POA: Diagnosis not present

## 2015-10-28 DIAGNOSIS — M9901 Segmental and somatic dysfunction of cervical region: Secondary | ICD-10-CM | POA: Diagnosis not present

## 2015-10-28 DIAGNOSIS — S43421A Sprain of right rotator cuff capsule, initial encounter: Secondary | ICD-10-CM | POA: Diagnosis not present

## 2015-10-29 DIAGNOSIS — M9901 Segmental and somatic dysfunction of cervical region: Secondary | ICD-10-CM | POA: Diagnosis not present

## 2015-10-29 DIAGNOSIS — S43421A Sprain of right rotator cuff capsule, initial encounter: Secondary | ICD-10-CM | POA: Diagnosis not present

## 2015-10-29 DIAGNOSIS — M9902 Segmental and somatic dysfunction of thoracic region: Secondary | ICD-10-CM | POA: Diagnosis not present

## 2015-10-29 DIAGNOSIS — M9907 Segmental and somatic dysfunction of upper extremity: Secondary | ICD-10-CM | POA: Diagnosis not present

## 2015-10-30 DIAGNOSIS — H353132 Nonexudative age-related macular degeneration, bilateral, intermediate dry stage: Secondary | ICD-10-CM | POA: Diagnosis not present

## 2015-10-30 DIAGNOSIS — H3562 Retinal hemorrhage, left eye: Secondary | ICD-10-CM | POA: Diagnosis not present

## 2015-10-30 DIAGNOSIS — H353221 Exudative age-related macular degeneration, left eye, with active choroidal neovascularization: Secondary | ICD-10-CM | POA: Diagnosis not present

## 2015-10-30 DIAGNOSIS — H35352 Cystoid macular degeneration, left eye: Secondary | ICD-10-CM | POA: Diagnosis not present

## 2015-10-30 DIAGNOSIS — H43811 Vitreous degeneration, right eye: Secondary | ICD-10-CM | POA: Diagnosis not present

## 2015-11-08 ENCOUNTER — Encounter: Payer: Self-pay | Admitting: Cardiovascular Disease

## 2015-11-08 ENCOUNTER — Ambulatory Visit (INDEPENDENT_AMBULATORY_CARE_PROVIDER_SITE_OTHER): Payer: Medicare Other | Admitting: Cardiovascular Disease

## 2015-11-08 VITALS — BP 100/44 | HR 76 | Ht 65.0 in | Wt 161.5 lb

## 2015-11-08 DIAGNOSIS — I739 Peripheral vascular disease, unspecified: Secondary | ICD-10-CM | POA: Diagnosis not present

## 2015-11-08 DIAGNOSIS — I10 Essential (primary) hypertension: Secondary | ICD-10-CM

## 2015-11-08 DIAGNOSIS — I779 Disorder of arteries and arterioles, unspecified: Secondary | ICD-10-CM | POA: Diagnosis not present

## 2015-11-08 MED ORDER — CILOSTAZOL 100 MG PO TABS: 100.0000 mg | ORAL_TABLET | Freq: Two times a day (BID) | ORAL | Status: DC

## 2015-11-08 NOTE — Assessment & Plan Note (Signed)
History of carotid artery disease with moderate right ICA stenosis confirmed by angiography 05/14/14 despite Dopplers that suggested slightly more severe disease. We are following this on an annual basis.

## 2015-11-08 NOTE — Assessment & Plan Note (Signed)
History of hyperlipidemia on statin therapy followed by his PCP 

## 2015-11-08 NOTE — Progress Notes (Signed)
11/08/2015 Christian Bailey   10-Nov-1940  [id number]  Primary Physician Tivis Ringer, MD Primary Cardiologist: Lorretta Harp MD [name]   HPI:  The patient is a very pleasant 75 year old, married Caucasian male, father of 2, grandfather to 1 grandchild who I last saw 09/12/14. Christian KitchenHis daughter-in-law is a primary care physician at [employer]. He has a history of PVOD and stenting of his left common iliac artery 20 years ago. He was smoking 3 packs a day at that time but stopped smoking 20 years ago.   His other problems include hypertension, hyperlipidemia and noninsulin-requiring diabetes. He had a negative Myoview in our office August 18, 2010. Because of claudication, I performed angiograph on him May 12, 2010, revealing an intact left common iliac artery stent, high-grade left external iliac artery stenosis which I stented using a 7 mm x 3 cm long Nitinol self-expanding stent. I also put 2 stents in his right SFA. Followup Dopplers showed improvement but symptomatically he did not really improve much. He still complains of predictable bilateral lower extremity claudication at several hundred yards. His Dopplers have remained stable. Dr. Dagmar Hait follows his lipid profile which was recently done earlier this month revealing a total cholesterol of 153, LDL of 86 and HDL of 34. Recent lower with Doppler studies performed in October 2014 revealed patent stents with a right ABI of 0.81 and a left of 0.67.over the past year he's noticed progressive but without limiting claudication with recent Dopplers performed 04/04/14 revealing a decline in his right ABI from 0.81-0.53 with progression of disease in his right SFA suggesting "in-stent restenosis.I also performed carotid Doppler studies that suggested moderately severe right internal carotid artery stenosis.   As a result of these studies I performed angiography on him 05/14/14 revealing a patent left external iliac artery stent with  at most 30-40% "in-stent restenosis, and known occluded left SFA with two-vessel runoff (occluded posterior tibial), patent proximal right SFA stent with diffuse "95% in-stent restenosis" within the distal right SFA stent. He had three-vessel runoff on the right. I performed a drug eluting balloon angioplasty for the in-stent restenosis with excellent angiographic and Doppler result although his symptoms did not significantly improve. I also angiogram his carotids because of a moderately severe right ICA stenosis by duplex ultrasound revealing a 50% hypodense right internal carotid artery stenosis with a bovine arch. We have continued to follow these noninvasively. He is neurologically asymptomatic. He denies chest pain or shortness of breath. Since I saw him a year [name] has noticed increasingclaudication bilaterally. He did stop his Pletal ever noticed a worsening of his symptoms after that Dopplers performed a year ago after his right SFA intervention revealed widely patent stents. I am going to restart his Pletal.   Current Outpatient Prescriptions  Medication Sig Dispense Refill  . clopidogrel (PLAVIX) 75 MG tablet TAKE 1 TABLET DAILY 90 tablet 2  . co-enzyme Q-10 30 MG capsule Take 30 mg by mouth daily.    Christian Bailey glucosamine-chondroitin 500-400 MG tablet Take 1 tablet by mouth 2 (two) times daily.    Christian Bailey lisinopril (PRINIVIL,ZESTRIL) 5 MG tablet Take 5 mg by mouth daily.    . metFORMIN (GLUCOPHAGE) 1000 MG tablet Take 1 tablet (1,000 mg total) by mouth 2 (two) times daily with a meal.    . metoprolol succinate (TOPROL-XL) 25 MG 24 hr tablet Take 25 mg by mouth 2 (two) times daily.     . Omega-3 Fatty Acids (FISH OIL) 1000 MG CAPS Take  1 capsule (1,000 mg total) by mouth daily.  0  . pantoprazole (PROTONIX) 40 MG tablet TAKE 2 TABLETS DAILY 180 tablet 2  . rosuvastatin (CRESTOR) 20 MG tablet Take 20 mg by mouth daily.     No current facility-administered medications for this visit.    Allergies    Allergen Reactions  . Asa [Aspirin] Other (See Comments)    GI bleeding    Social History   Social History  . Marital Status: Married    Spouse Name: N/A  . Number of Children: 2  . Years of Education: N/A   Occupational History  . retired   .     Social History Main Topics  . Smoking status: Former Research scientist (life sciences)  . Smokeless tobacco: Never Used  . Alcohol Use: Yes     Comment: rarely  . Drug Use: No  . Sexual Activity: Not on file   Other Topics Concern  . Not on file   Social History Narrative     Review of Systems: General: negative for chills, fever, night sweats or weight changes.  Cardiovascular: negative for chest pain, dyspnea on exertion, edema, orthopnea, palpitations, paroxysmal nocturnal dyspnea or shortness of breath Dermatological: negative for rash Respiratory: negative for cough or wheezing Urologic: negative for hematuria Abdominal: negative for nausea, vomiting, diarrhea, bright red blood per rectum, melena, or hematemesis Neurologic: negative for visual changes, syncope, or dizziness All other systems reviewed and are otherwise negative except as noted above.    Blood pressure 100/44, pulse 76, height 5\' 5"  (1.651 m), weight 161 lb 8 oz (73.256 kg).  General appearance: alert and no distress Neck: no adenopathy, no JVD, supple, symmetrical, trachea midline, thyroid not enlarged, symmetric, no tenderness/mass/nodules and soft right carotid bruit Lungs: clear to auscultation bilaterally Heart: regular rate and rhythm, S1, S2 normal, no murmur, click, rub or gallop Extremities: extremities normal, atraumatic, no cyanosis or edema  EKG normal sinus rhythm at 76 with right bundle branch block which is known to be old. I personally reviewed this EKG  ASSESSMENT AND PLAN:   PAD (peripheral artery disease), hx remote stent Lt common iliac, 04/2010-stent to lt common iliac & 2 stents to the Rt SFA, 04/2014 - s/p PTA of right SFA for in-stent restenosis   History of PAD status post remote left common iliac artery stenting. I stented his left external iliac artery 05/12/10 as well as his right SFA. I re-angiogram. 05/14/14 revealing patent iliac stents on the left, I known occluded left SFA high-grade disease beyond the previously placed right SFA stent which I re-intervened on using a drug-eluting balloon. This follow-up Dopplers performed 11/29/14 revealed a right ABI 0.85 with a widely patent right SFA, left ABI 0.71 with patent iliac stents and occluded left SFA. He did stop his Pletal and subsequently noticed increasing claudication bilaterally symmetric. I'm going to repeat lower severe arterial Doppler studies and restart him on Pletal 100 mg by mouth twice a day.  Essential hypertension history of hypertension with blood pressure measured at 100/44. He is on lisinopril and metoprolol. Continue current meds at current dosing  Hyperlipidemia History of hyperlipidemia on statin therapy followed by his PCP  Carotid artery disease History of carotid artery disease with moderate right ICA stenosis confirmed by angiography 05/14/14 despite Dopplers that suggested slightly more severe disease. We are following this on an annual basis.      Lorretta Harp MD FACP,FACC,FAHA, FSCAI 11/08/2015 9:00 AM

## 2015-11-08 NOTE — Patient Instructions (Signed)
Medication Instructions:  Your physician has recommended you make the following change in your medication:  1- START PLETAL 100MG  (1 TABLET) BY MOUTH TWICE A DAY.   Labwork: none  Testing/Procedures: Your physician has requested that you have a lower extremity arterial doppler- During this test, ultrasound is used to evaluate arterial blood flow in the legs. Allow approximately one hour for this exam.   Your physician has requested that you have a carotid duplex. This test is an ultrasound of the carotid arteries in your neck. It looks at blood flow through these arteries that supply the brain with blood. Allow one hour for this exam. There are no restrictions or special instructions.   Follow-Up: Your physician wants you to follow-up in: [place]. You will receive a reminder letter in the mail two months in advance. If you don't receive a letter, please call our office to schedule the follow-up appointment.   Any Other Special Instructions Will Be Listed Below (If Applicable).     If you need a refill on your cardiac medications before your next appointment, please call your pharmacy.

## 2015-11-08 NOTE — Assessment & Plan Note (Signed)
history of hypertension with blood pressure measured at 100/44. He is on lisinopril and metoprolol. Continue current meds at current dosing

## 2015-11-08 NOTE — Assessment & Plan Note (Signed)
History of PAD status post remote left common iliac artery stenting. I stented his left external iliac artery 05/12/10 as well as his right SFA. I re-angiogram. 05/14/14 revealing patent iliac stents on the left, I known occluded left SFA high-grade disease beyond the previously placed right SFA stent which I re-intervened on using a drug-eluting balloon. This follow-up Dopplers performed 11/29/14 revealed a right ABI 0.85 with a widely patent right SFA, left ABI 0.71 with patent iliac stents and occluded left SFA. He did stop his Pletal and subsequently noticed increasing claudication bilaterally symmetric. I'm going to repeat lower severe arterial Doppler studies and restart him on Pletal 100 mg by mouth twice a day.

## 2015-11-27 DIAGNOSIS — E1351 Other specified diabetes mellitus with diabetic peripheral angiopathy without gangrene: Secondary | ICD-10-CM | POA: Diagnosis not present

## 2015-11-27 DIAGNOSIS — L602 Onychogryphosis: Secondary | ICD-10-CM | POA: Diagnosis not present

## 2015-12-06 ENCOUNTER — Other Ambulatory Visit: Payer: Self-pay | Admitting: Cardiovascular Disease

## 2015-12-06 DIAGNOSIS — I739 Peripheral vascular disease, unspecified: Secondary | ICD-10-CM

## 2015-12-09 ENCOUNTER — Other Ambulatory Visit: Payer: Self-pay | Admitting: Cardiovascular Disease

## 2015-12-09 ENCOUNTER — Ambulatory Visit (HOSPITAL_COMMUNITY)
Admission: RE | Admit: 2015-12-09 | Discharge: 2015-12-09 | Disposition: A | Discharge status: Home / Self Care | Payer: Medicare Other | Source: Ambulatory Visit | Attending: Cardiovascular Disease | Admitting: Cardiovascular Disease

## 2015-12-09 DIAGNOSIS — I739 Peripheral vascular disease, unspecified: Secondary | ICD-10-CM | POA: Insufficient documentation

## 2015-12-09 DIAGNOSIS — I700 Atherosclerosis of aorta: Secondary | ICD-10-CM | POA: Diagnosis not present

## 2015-12-09 DIAGNOSIS — I708 Atherosclerosis of other arteries: Secondary | ICD-10-CM | POA: Insufficient documentation

## 2015-12-09 DIAGNOSIS — E119 Type 2 diabetes mellitus without complications: Secondary | ICD-10-CM | POA: Insufficient documentation

## 2015-12-09 DIAGNOSIS — I771 Stricture of artery: Secondary | ICD-10-CM | POA: Insufficient documentation

## 2015-12-09 DIAGNOSIS — I10 Essential (primary) hypertension: Secondary | ICD-10-CM | POA: Diagnosis not present

## 2015-12-09 DIAGNOSIS — R938 Abnormal findings on diagnostic imaging of other specified body structures: Secondary | ICD-10-CM | POA: Diagnosis not present

## 2015-12-09 DIAGNOSIS — E785 Hyperlipidemia, unspecified: Secondary | ICD-10-CM | POA: Insufficient documentation

## 2015-12-09 DIAGNOSIS — I4510 Unspecified right bundle-branch block: Secondary | ICD-10-CM | POA: Insufficient documentation

## 2015-12-09 DIAGNOSIS — K219 Gastro-esophageal reflux disease without esophagitis: Secondary | ICD-10-CM | POA: Diagnosis not present

## 2015-12-13 ENCOUNTER — Encounter: Payer: Self-pay | Admitting: Cardiovascular Disease

## 2015-12-13 ENCOUNTER — Ambulatory Visit (INDEPENDENT_AMBULATORY_CARE_PROVIDER_SITE_OTHER): Payer: Medicare Other | Admitting: Cardiovascular Disease

## 2015-12-13 VITALS — BP 102/54 | HR 105 | Ht 65.0 in | Wt 156.2 lb

## 2015-12-13 DIAGNOSIS — I739 Peripheral vascular disease, unspecified: Secondary | ICD-10-CM

## 2015-12-13 NOTE — Assessment & Plan Note (Signed)
Christian Bailey returns today for follow-up of his lower actually arterial Doppler studies. His right ABI has fallen from 0.85 performed 11/29/14 down to 0.68 with an occluded right SFA suggesting that he has had an occluded stent. I did start him on Pletal which has resulted in some improvement in his claudication symptoms. At this point, we are going to wait 3 months to assess efficacy of Pletal prior to deciding whether or not to perform intervention.

## 2015-12-13 NOTE — Progress Notes (Signed)
[name] returns today for follow-up of his lower actually arterial Doppler studies. His right ABI has fallen from 0.85 performed 11/29/14 down to 0.68 with an occluded right SFA suggesting that he has had an occluded stent. I did start him on Pletal which has resulted in some improvement in his claudication symptoms. At this point, we are going to wait 3 months to assess efficacy of Pletal prior to deciding whether or not to perform intervention.

## 2015-12-13 NOTE — Patient Instructions (Signed)
Medication Instructions:  Your physician recommends that you continue on your current medications as directed. Please refer to the Current Medication list given to you today.   Labwork: none  Testing/Procedures: none  Follow-Up: Your physician recommends that you schedule a follow-up appointment in: 3 months with Dr. Berry.    Any Other Special Instructions Will Be Listed Below (If Applicable).     If you need a refill on your cardiac medications before your next appointment, please call your pharmacy.   

## 2015-12-27 ENCOUNTER — Other Ambulatory Visit: Payer: Self-pay | Admitting: Cardiovascular Disease

## 2016-01-14 ENCOUNTER — Other Ambulatory Visit: Payer: Self-pay | Admitting: Cardiovascular Disease

## 2016-01-15 DIAGNOSIS — H353221 Exudative age-related macular degeneration, left eye, with active choroidal neovascularization: Secondary | ICD-10-CM | POA: Diagnosis not present

## 2016-01-15 NOTE — Telephone Encounter (Signed)
Rx(s) sent to pharmacy electronically.  

## 2016-01-29 DIAGNOSIS — I70293 Other atherosclerosis of native arteries of extremities, bilateral legs: Secondary | ICD-10-CM | POA: Diagnosis not present

## 2016-01-29 DIAGNOSIS — L84 Corns and callosities: Secondary | ICD-10-CM | POA: Diagnosis not present

## 2016-01-29 DIAGNOSIS — L602 Onychogryphosis: Secondary | ICD-10-CM | POA: Diagnosis not present

## 2016-01-29 DIAGNOSIS — E1351 Other specified diabetes mellitus with diabetic peripheral angiopathy without gangrene: Secondary | ICD-10-CM | POA: Diagnosis not present

## 2016-01-30 DIAGNOSIS — E1151 Type 2 diabetes mellitus with diabetic peripheral angiopathy without gangrene: Secondary | ICD-10-CM | POA: Diagnosis not present

## 2016-01-30 DIAGNOSIS — Z125 Encounter for screening for malignant neoplasm of prostate: Secondary | ICD-10-CM | POA: Diagnosis not present

## 2016-01-30 DIAGNOSIS — I10 Essential (primary) hypertension: Secondary | ICD-10-CM | POA: Diagnosis not present

## 2016-01-30 DIAGNOSIS — E784 Other hyperlipidemia: Secondary | ICD-10-CM | POA: Diagnosis not present

## 2016-02-05 DIAGNOSIS — H353211 Exudative age-related macular degeneration, right eye, with active choroidal neovascularization: Secondary | ICD-10-CM | POA: Diagnosis not present

## 2016-02-07 DIAGNOSIS — N182 Chronic kidney disease, stage 2 (mild): Secondary | ICD-10-CM | POA: Diagnosis not present

## 2016-02-07 DIAGNOSIS — Z0000 Encounter for general adult medical examination without abnormal findings: Secondary | ICD-10-CM | POA: Diagnosis not present

## 2016-02-07 DIAGNOSIS — H3530 Unspecified macular degeneration: Secondary | ICD-10-CM | POA: Diagnosis not present

## 2016-02-07 DIAGNOSIS — I739 Peripheral vascular disease, unspecified: Secondary | ICD-10-CM | POA: Diagnosis not present

## 2016-02-07 DIAGNOSIS — E1151 Type 2 diabetes mellitus with diabetic peripheral angiopathy without gangrene: Secondary | ICD-10-CM | POA: Diagnosis not present

## 2016-02-07 DIAGNOSIS — Z1389 Encounter for screening for other disorder: Secondary | ICD-10-CM | POA: Diagnosis not present

## 2016-02-07 DIAGNOSIS — I2510 Atherosclerotic heart disease of native coronary artery without angina pectoris: Secondary | ICD-10-CM | POA: Diagnosis not present

## 2016-02-07 DIAGNOSIS — Z6826 Body mass index (BMI) 26.0-26.9, adult: Secondary | ICD-10-CM | POA: Diagnosis not present

## 2016-02-07 DIAGNOSIS — E784 Other hyperlipidemia: Secondary | ICD-10-CM | POA: Diagnosis not present

## 2016-02-07 DIAGNOSIS — K279 Peptic ulcer, site unspecified, unspecified as acute or chronic, without hemorrhage or perforation: Secondary | ICD-10-CM | POA: Diagnosis not present

## 2016-02-07 DIAGNOSIS — I10 Essential (primary) hypertension: Secondary | ICD-10-CM | POA: Diagnosis not present

## 2016-02-07 DIAGNOSIS — M1990 Unspecified osteoarthritis, unspecified site: Secondary | ICD-10-CM | POA: Diagnosis not present

## 2016-02-10 DIAGNOSIS — Z1212 Encounter for screening for malignant neoplasm of rectum: Secondary | ICD-10-CM | POA: Diagnosis not present

## 2016-02-10 LAB — IFOBT (OCCULT BLOOD): IFOBT: NEGATIVE

## 2016-02-19 ENCOUNTER — Telehealth: Payer: Self-pay | Admitting: Cardiovascular Disease

## 2016-02-19 NOTE — Telephone Encounter (Signed)
[facility] appointment on 03/13/16 with Dr Gwenlyn Found.  Records given to [facility] (medical records) for Dr Kennon Holter schedule on 03/13/16. lp

## 2016-02-26 ENCOUNTER — Telehealth: Payer: Self-pay | Admitting: Cardiovascular Disease

## 2016-02-26 ENCOUNTER — Encounter: Payer: Self-pay | Admitting: Cardiovascular Disease

## 2016-02-26 NOTE — Telephone Encounter (Signed)
Closed encounter °

## 2016-03-10 DIAGNOSIS — H353221 Exudative age-related macular degeneration, left eye, with active choroidal neovascularization: Secondary | ICD-10-CM | POA: Diagnosis not present

## 2016-03-13 ENCOUNTER — Ambulatory Visit: Payer: TRICARE For Life (TFL) | Admitting: Cardiovascular Disease

## 2016-03-25 ENCOUNTER — Other Ambulatory Visit: Payer: Self-pay | Admitting: *Deleted

## 2016-03-25 ENCOUNTER — Encounter: Payer: Self-pay | Admitting: Cardiovascular Disease

## 2016-03-25 ENCOUNTER — Ambulatory Visit (INDEPENDENT_AMBULATORY_CARE_PROVIDER_SITE_OTHER): Payer: Medicare Other | Admitting: Cardiovascular Disease

## 2016-03-25 VITALS — BP 114/44 | HR 99 | Ht 65.0 in | Wt 159.0 lb

## 2016-03-25 DIAGNOSIS — Z01810 Encounter for preprocedural cardiovascular examination: Secondary | ICD-10-CM

## 2016-03-25 DIAGNOSIS — E785 Hyperlipidemia, unspecified: Secondary | ICD-10-CM

## 2016-03-25 DIAGNOSIS — I739 Peripheral vascular disease, unspecified: Secondary | ICD-10-CM

## 2016-03-25 DIAGNOSIS — I779 Disorder of arteries and arterioles, unspecified: Secondary | ICD-10-CM

## 2016-03-25 DIAGNOSIS — Z01818 Encounter for other preprocedural examination: Secondary | ICD-10-CM

## 2016-03-25 DIAGNOSIS — I10 Essential (primary) hypertension: Secondary | ICD-10-CM

## 2016-03-25 DIAGNOSIS — Z7902 Long term (current) use of antithrombotics/antiplatelets: Secondary | ICD-10-CM

## 2016-03-25 NOTE — Assessment & Plan Note (Signed)
History of hypertensive blood pressure measured 114/44. He is on lisinopril and metoprolol. Continue current meds at current dosing

## 2016-03-25 NOTE — Assessment & Plan Note (Signed)
History of peripheral arterial disease status post stenting of his left external iliac artery and right SFA with self-expanding stents in [place] the past. I left angiogram. 05/14/14 revealing a patent left external iliac artery stent with 30-40% "in-stent restenosis, occluded left SFA with two-vessel runoff, patent proximal right SFA stent with 95% "in-stent restenosis" within the distal right SFA stent. There was three-vessel runoff. I performed a drug-eluting balloon angioplasty with an excellent result. Follow-up Dopplers were favorable however his most recent Dopplers performed 12/09/15 revealed a decline in his right ABI from 0.85 down to 0.68 with what appears to be an occluded distal right SFA. I did begin him on Pletal which afforded him minimal relief. He wishes to proceed with angiography revealing intervention.

## 2016-03-25 NOTE — Progress Notes (Signed)
03/25/2016 Christian Bailey   12/31/1940  [id number]  Primary Physician Tivis Ringer, MD Primary Cardiologist: Lorretta Harp MD [name], [place]  HPI:  The patient is a very pleasant 75 year old, married Caucasian male, father of 2, grandfather to 1 grandchild who I last saw 12/13/15. [name] KitchenHis daughter-in-law is a primary care physician at [employer]. He has a history of PVOD and stenting of his left common iliac artery 20 years ago. He was smoking 3 packs a day at that time but stopped smoking 20 years ago.   His other problems include hypertension, hyperlipidemia and noninsulin-requiring diabetes. He had a negative Myoview in our office August 18, 2010. Because of claudication, I performed angiograph on him May 12, 2010, revealing an intact left common iliac artery stent, high-grade left external iliac artery stenosis which I stented using a 7 mm x 3 cm long Nitinol self-expanding stent. I also put 2 stents in his right SFA. Followup Dopplers showed improvement but symptomatically he did not really improve much. He still complains of predictable bilateral lower extremity claudication at several hundred yards. His Dopplers have remained stable. Dr. Dagmar Hait follows his lipid profile which was recently done earlier this month revealing a total cholesterol of 153, LDL of 86 and HDL of 34. Recent lower with Doppler studies performed in October 2014 revealed patent stents with a right ABI of 0.81 and a left of 0.67.over the past year he's noticed progressive but without limiting claudication with recent Dopplers performed 04/04/14 revealing a decline in his right ABI from 0.81-0.53 with progression of disease in his right SFA suggesting "in-stent restenosis.I also performed carotid Doppler studies that suggested moderately severe right internal carotid artery stenosis.   As a result of these studies I performed angiography on him 05/14/14 revealing a patent left external iliac artery stent with  at most 30-40% "in-stent restenosis, and known occluded left SFA with two-vessel runoff (occluded posterior tibial), patent proximal right SFA stent with diffuse "95% in-stent restenosis" within the distal right SFA stent. He had three-vessel runoff on the right. I performed a drug eluting balloon angioplasty for the in-stent restenosis with excellent angiographic and Doppler result although his symptoms did not significantly improve. I also angiogram his carotids because of a moderately severe right ICA stenosis by duplex ultrasound revealing a 50% hypodense right internal carotid artery stenosis with a bovine arch. We have continued to follow these noninvasively. He is neurologically asymptomatic. He denies chest pain or shortness of breath. Since I saw him a year [name] has noticed increasing claudication bilaterally. We obtained lower extremity Dopplers 12/09/15 revealing a decline in his right ABI from 0.85 down to 0.68 and occluded distal right SFA. We did begin him on Pletal which she has taken for last several months with minimal benefit. Because of this, my tenderness to perform angiography every intervention on his right leg potentially implanting a Viabahn covered stent.   Current Outpatient Prescriptions  Medication Sig Dispense Refill  . cilostazol (PLETAL) 100 MG tablet Take 1 tablet (100 mg total) by mouth 2 (two) times daily. 180 tablet 3  . clopidogrel (PLAVIX) 75 MG tablet Take 1 tablet (75 mg total) by mouth daily. 90 tablet 3  . co-enzyme Q-10 30 MG capsule Take 30 mg by mouth daily.    [name] glucosamine-chondroitin 500-400 MG tablet Take 1 tablet by mouth 2 (two) times daily.    [name] lisinopril (PRINIVIL,ZESTRIL) 5 MG tablet Take 5 mg by mouth daily.    . metFORMIN (  GLUCOPHAGE) 1000 MG tablet Take 1 tablet (1,000 mg total) by mouth 2 (two) times daily with a meal.    . metoprolol succinate (TOPROL-XL) 25 MG 24 hr tablet Take 25 mg by mouth 2 (two) times daily.     . Omega-3 Fatty Acids (FISH  OIL) 1000 MG CAPS Take 1 capsule (1,000 mg total) by mouth daily.  0  . pantoprazole (PROTONIX) 40 MG tablet TAKE 2 TABLETS DAILY 180 tablet 2  . rosuvastatin (CRESTOR) 20 MG tablet Take 20 mg by mouth daily.     No current facility-administered medications for this visit.     Allergies  Allergen Reactions  . Asa [Aspirin] Other (See Comments)    GI bleeding    Social History   Social History  . Marital status: Married    Spouse name: N/A  . Number of children: 2  . Years of education: N/A   Occupational History  . retired   .  Retired   Social History Main Topics  . Smoking status: Former Research scientist (life sciences)  . Smokeless tobacco: Never Used  . Alcohol use Yes     Comment: rarely  . Drug use: No  . Sexual activity: Not on file   Other Topics Concern  . Not on file   Social History Narrative  . No narrative on file     Review of Systems: General: negative for chills, fever, night sweats or weight changes.  Cardiovascular: negative for chest pain, dyspnea on exertion, edema, orthopnea, palpitations, paroxysmal nocturnal dyspnea or shortness of breath Dermatological: negative for rash Respiratory: negative for cough or wheezing Urologic: negative for hematuria Abdominal: negative for nausea, vomiting, diarrhea, bright red blood per rectum, melena, or hematemesis Neurologic: negative for visual changes, syncope, or dizziness All other systems reviewed and are otherwise negative except as noted above.    Blood pressure (!) 114/44, pulse 99, height 5\' 5"  (1.651 m), weight 159 lb (72.1 kg).  General appearance: alert and no distress Neck: no adenopathy, no carotid bruit, no JVD, supple, symmetrical, trachea midline and thyroid not enlarged, symmetric, no tenderness/mass/nodules Lungs: clear to auscultation bilaterally Heart: regular rate and rhythm, S1, S2 normal, no murmur, click, rub or gallop Extremities: extremities normal, atraumatic, no cyanosis or edema  EKG not  performed today  ASSESSMENT AND PLAN:   PAD (peripheral artery disease), hx remote stent Lt common iliac, 04/2010-stent to lt common iliac & 2 stents to the Rt SFA, 04/2014 - s/p PTA of right SFA for in-stent restenosis  History of peripheral arterial disease status post stenting of his left external iliac artery and right SFA with self-expanding stents in [place] the past. I left angiogram. 05/14/14 revealing a patent left external iliac artery stent with 30-40% "in-stent restenosis, occluded left SFA with two-vessel runoff, patent proximal right SFA stent with 95% "in-stent restenosis" within the distal right SFA stent. There was three-vessel runoff. I performed a drug-eluting balloon angioplasty with an excellent result. Follow-up Dopplers were favorable however his most recent Dopplers performed 12/09/15 revealed a decline in his right ABI from 0.85 down to 0.68 with what appears to be an occluded distal right SFA. I did begin him on Pletal which afforded him minimal relief. He wishes to proceed with angiography revealing intervention.  Essential hypertension History of hypertensive blood pressure measured 114/44. He is on lisinopril and metoprolol. Continue current meds at current dosing  Hyperlipidemia History of hyperlipidemia on statin therapy followed by his PCP  Carotid artery disease History of moderately severe right  internal carotid artery stenosis by duplex salt for sound 06/04/15. At the time of angiography 05/14/14 I did perform carotid angiography as well revealing a 50% proximal hypodense right internal carotid artery stenosis. We continue to follow this by duplex ultrasound.      Lorretta Harp MD FACP,FACC,FAHA, [facility] 03/25/2016 3:55 PM

## 2016-03-25 NOTE — Assessment & Plan Note (Signed)
History of moderately severe right internal carotid artery stenosis by duplex salt for sound 06/04/15. At the time of angiography 05/14/14 I did perform carotid angiography as well revealing a 50% proximal hypodense right internal carotid artery stenosis. We continue to follow this by duplex ultrasound.

## 2016-03-25 NOTE — Patient Instructions (Addendum)
Medication Instructions:  NO CHANGES.   Testing/Procedures: Your physician has requested that you have a carotid duplex. This test is an ultrasound of the carotid arteries in your neck. It looks at blood flow through these arteries that supply the brain with blood. Allow one hour for this exam. There are no restrictions or special instructions. DUE IN DEC 2017.   Dr. Gwenlyn Found has ordered a peripheral angiogram to be done at [facility].  This procedure is going to look at the bloodflow in your lower extremities.  If Dr. Gwenlyn Found is able to open up the arteries, you will have to spend one night in the hospital.  If he is not able to open the arteries, you will be able to go home that same day.   SCHEDULE FOR OCT 23RD.   After the procedure, you will not be allowed to drive for 3 days or push, pull, or lift anything greater than 10 lbs for one week.    You will be required to have the following tests prior to the procedure:  1. Blood work-the blood work can be done no more than 14 days prior to the procedure.  It can be done at any [facility] lab.  There is one downstairs on the first floor of this building and one in the [facility] building [phone] [address])  2. Chest Xray-the chest xray order has already been placed at the [place].     *REPS   [facility]  Puncture site LEFT GROIN   If you need a refill on your cardiac medications before your next appointment, please call your pharmacy.

## 2016-03-25 NOTE — Assessment & Plan Note (Signed)
History of hyperlipidemia on statin therapy followed by his PCP 

## 2016-04-01 DIAGNOSIS — E1351 Other specified diabetes mellitus with diabetic peripheral angiopathy without gangrene: Secondary | ICD-10-CM | POA: Diagnosis not present

## 2016-04-01 DIAGNOSIS — L602 Onychogryphosis: Secondary | ICD-10-CM | POA: Diagnosis not present

## 2016-04-01 DIAGNOSIS — I70293 Other atherosclerosis of native arteries of extremities, bilateral legs: Secondary | ICD-10-CM | POA: Diagnosis not present

## 2016-04-10 DIAGNOSIS — D689 Coagulation defect, unspecified: Secondary | ICD-10-CM | POA: Diagnosis not present

## 2016-04-10 DIAGNOSIS — Z01818 Encounter for other preprocedural examination: Secondary | ICD-10-CM | POA: Diagnosis not present

## 2016-04-10 DIAGNOSIS — I739 Peripheral vascular disease, unspecified: Secondary | ICD-10-CM | POA: Diagnosis not present

## 2016-04-10 DIAGNOSIS — Z7902 Long term (current) use of antithrombotics/antiplatelets: Secondary | ICD-10-CM | POA: Diagnosis not present

## 2016-04-10 DIAGNOSIS — Z79899 Other long term (current) drug therapy: Secondary | ICD-10-CM | POA: Diagnosis not present

## 2016-04-10 DIAGNOSIS — Z01810 Encounter for preprocedural cardiovascular examination: Secondary | ICD-10-CM | POA: Diagnosis not present

## 2016-04-10 LAB — CBC WITH DIFFERENTIAL/PLATELET
BASOS ABS: 82 {cells}/uL (ref 0–200)
Basophils Relative: 1 %
Eosinophils Absolute: 246 cells/uL (ref 15–500)
Eosinophils Relative: 3 %
HEMATOCRIT: 34.4 % — AB (ref 38.5–50.0)
Hemoglobin: 11.7 g/dL — ABNORMAL LOW (ref 13.2–17.1)
LYMPHS PCT: 21 %
Lymphs Abs: 1722 cells/uL (ref 850–3900)
MCH: 30 pg (ref 27.0–33.0)
MCHC: 34 g/dL (ref 32.0–36.0)
MCV: 88.2 fL (ref 80.0–100.0)
MONO ABS: 656 {cells}/uL (ref 200–950)
MPV: 9.4 fL (ref 7.5–12.5)
Monocytes Relative: 8 %
NEUTROS PCT: 67 %
Neutro Abs: 5494 cells/uL (ref 1500–7800)
Platelets: 260 10*3/uL (ref 140–400)
RBC: 3.9 MIL/uL — AB (ref 4.20–5.80)
RDW: 13.4 % (ref 11.0–15.0)
WBC: 8.2 10*3/uL (ref 3.8–10.8)

## 2016-04-10 LAB — BASIC METABOLIC PANEL
BUN: 22 mg/dL (ref 7–25)
CALCIUM: 9.5 mg/dL (ref 8.6–10.3)
CO2: 28 mmol/L (ref 20–31)
CREATININE: 1.33 mg/dL — AB (ref 0.70–1.18)
Chloride: 104 mmol/L (ref 98–110)
GLUCOSE: 118 mg/dL — AB (ref 65–99)
POTASSIUM: 4.6 mmol/L (ref 3.5–5.3)
Sodium: 141 mmol/L (ref 135–146)

## 2016-04-10 LAB — TSH: TSH: 1.39 m[IU]/L (ref 0.40–4.50)

## 2016-04-10 LAB — APTT: APTT: 26 s (ref 22–34)

## 2016-04-10 LAB — PROTIME-INR
INR: 1
Prothrombin Time: 10.9 s (ref 9.0–11.5)

## 2016-04-13 ENCOUNTER — Ambulatory Visit
Admission: RE | Admit: 2016-04-13 | Discharge: 2016-04-13 | Disposition: A | Discharge status: Home / Self Care | Payer: Medicare Other | Source: Ambulatory Visit | Attending: Cardiovascular Disease | Admitting: Cardiovascular Disease

## 2016-04-13 ENCOUNTER — Telehealth: Payer: Self-pay | Admitting: *Deleted

## 2016-04-13 DIAGNOSIS — R918 Other nonspecific abnormal finding of lung field: Secondary | ICD-10-CM | POA: Diagnosis not present

## 2016-04-13 DIAGNOSIS — Z01818 Encounter for other preprocedural examination: Secondary | ICD-10-CM

## 2016-04-13 DIAGNOSIS — I739 Peripheral vascular disease, unspecified: Secondary | ICD-10-CM

## 2016-04-13 DIAGNOSIS — Z01810 Encounter for preprocedural cardiovascular examination: Secondary | ICD-10-CM

## 2016-04-13 NOTE — Telephone Encounter (Signed)
Representative from [place] called. Relaying impression from chest xray results.   Patient is schedule for PV angiogram on 10 /23/17 Will defer to Dr Gwenlyn Found to review

## 2016-04-15 ENCOUNTER — Telehealth: Payer: Self-pay | Admitting: *Deleted

## 2016-04-15 DIAGNOSIS — R9389 Abnormal findings on diagnostic imaging of other specified body structures: Secondary | ICD-10-CM

## 2016-04-15 DIAGNOSIS — R911 Solitary pulmonary nodule: Secondary | ICD-10-CM

## 2016-04-15 NOTE — Telephone Encounter (Signed)
-----   Message from Lorretta Harp, MD sent at 04/13/2016 10:29 AM EDT ----- Please get CT scan scan of lungs to follow-up pulmonary nodule on chest x-ray.

## 2016-04-15 NOTE — Telephone Encounter (Signed)
No answer. Phone rang many times, no voicemail picked up. Unable to leave message at this time.

## 2016-04-16 NOTE — Telephone Encounter (Signed)
Dr Gwenlyn Found reviewed results on 04/13/16 in result note.

## 2016-04-16 NOTE — Telephone Encounter (Signed)
Returned call to patient. Gave him results of preop lab work and chest xray. Explained there was an pulmonary nodule on chest xray but would need a CT scan to further evaluate. He is aware someone will call to schedule. He had no further questions.  CT of lung order entered. Message sent to scheduling.

## 2016-04-20 ENCOUNTER — Encounter (HOSPITAL_COMMUNITY): Payer: Self-pay | Admitting: Cardiovascular Disease

## 2016-04-20 ENCOUNTER — Encounter (HOSPITAL_COMMUNITY)
Admission: RE | Disposition: A | Discharge status: Home / Self Care | Payer: Self-pay | Source: Ambulatory Visit | Attending: Cardiovascular Disease

## 2016-04-20 ENCOUNTER — Ambulatory Visit (HOSPITAL_COMMUNITY)
Admission: RE | Admit: 2016-04-20 | Discharge: 2016-04-20 | Disposition: A | Discharge status: Home / Self Care | Payer: Medicare Other | Source: Ambulatory Visit | Attending: Cardiovascular Disease | Admitting: Cardiovascular Disease

## 2016-04-20 DIAGNOSIS — Z87891 Personal history of nicotine dependence: Secondary | ICD-10-CM | POA: Diagnosis not present

## 2016-04-20 DIAGNOSIS — T82856A Stenosis of peripheral vascular stent, initial encounter: Secondary | ICD-10-CM | POA: Diagnosis not present

## 2016-04-20 DIAGNOSIS — I10 Essential (primary) hypertension: Secondary | ICD-10-CM | POA: Diagnosis not present

## 2016-04-20 DIAGNOSIS — I6521 Occlusion and stenosis of right carotid artery: Secondary | ICD-10-CM | POA: Insufficient documentation

## 2016-04-20 DIAGNOSIS — I70213 Atherosclerosis of native arteries of extremities with intermittent claudication, bilateral legs: Secondary | ICD-10-CM | POA: Diagnosis not present

## 2016-04-20 DIAGNOSIS — I739 Peripheral vascular disease, unspecified: Secondary | ICD-10-CM | POA: Diagnosis present

## 2016-04-20 DIAGNOSIS — E785 Hyperlipidemia, unspecified: Secondary | ICD-10-CM | POA: Diagnosis not present

## 2016-04-20 DIAGNOSIS — E1151 Type 2 diabetes mellitus with diabetic peripheral angiopathy without gangrene: Secondary | ICD-10-CM | POA: Insufficient documentation

## 2016-04-20 DIAGNOSIS — Z7984 Long term (current) use of oral hypoglycemic drugs: Secondary | ICD-10-CM | POA: Insufficient documentation

## 2016-04-20 DIAGNOSIS — Y812 Prosthetic and other implants, materials and accessory general- and plastic-surgery devices associated with adverse incidents: Secondary | ICD-10-CM | POA: Insufficient documentation

## 2016-04-20 DIAGNOSIS — Z7902 Long term (current) use of antithrombotics/antiplatelets: Secondary | ICD-10-CM | POA: Diagnosis not present

## 2016-04-20 HISTORY — PX: PERIPHERAL VASCULAR CATHETERIZATION: SHX172C

## 2016-04-20 LAB — GLUCOSE, CAPILLARY
GLUCOSE-CAPILLARY: 132 mg/dL — AB (ref 65–99)
Glucose-Capillary: 125 mg/dL — ABNORMAL HIGH (ref 65–99)

## 2016-04-20 SURGERY — LOWER EXTREMITY INTERVENTION
Anesthesia: LOCAL

## 2016-04-20 MED ORDER — HEPARIN (PORCINE) IN NACL 2-0.9 UNIT/ML-% IJ SOLN
INTRAMUSCULAR | Status: DC | PRN
  Administered 2016-04-20: 1000 mL via INTRA_ARTERIAL

## 2016-04-20 MED ORDER — HYDRALAZINE HCL 20 MG/ML IJ SOLN: 10.0000 mg | INTRAMUSCULAR | Status: DC | PRN

## 2016-04-20 MED ORDER — IODIXANOL 320 MG/ML IV SOLN
INTRAVENOUS | Status: DC | PRN
  Administered 2016-04-20: 110 mL via INTRA_ARTERIAL

## 2016-04-20 MED ORDER — MIDAZOLAM HCL 2 MG/2ML IJ SOLN
INTRAMUSCULAR | Status: AC
  Filled 2016-04-20: qty 2

## 2016-04-20 MED ORDER — FENTANYL CITRATE (PF) 100 MCG/2ML IJ SOLN
INTRAMUSCULAR | Status: DC | PRN
  Administered 2016-04-20: 25 ug via INTRAVENOUS

## 2016-04-20 MED ORDER — FENTANYL CITRATE (PF) 100 MCG/2ML IJ SOLN
INTRAMUSCULAR | Status: AC
  Filled 2016-04-20: qty 2

## 2016-04-20 MED ORDER — ACETAMINOPHEN 325 MG PO TABS: 650.0000 mg | ORAL_TABLET | ORAL | Status: DC | PRN

## 2016-04-20 MED ORDER — MIDAZOLAM HCL 2 MG/2ML IJ SOLN
INTRAMUSCULAR | Status: DC | PRN
  Administered 2016-04-20: 1 mg via INTRAVENOUS

## 2016-04-20 MED ORDER — LIDOCAINE HCL (PF) 1 % IJ SOLN
INTRAMUSCULAR | Status: AC
  Filled 2016-04-20: qty 30

## 2016-04-20 MED ORDER — LIDOCAINE HCL (PF) 1 % IJ SOLN
INTRAMUSCULAR | Status: DC | PRN
  Administered 2016-04-20: 15 mL

## 2016-04-20 MED ORDER — SODIUM CHLORIDE 0.9 % IV SOLN: INTRAVENOUS | Status: AC

## 2016-04-20 MED ORDER — SODIUM CHLORIDE 0.9 % WEIGHT BASED INFUSION
3.0000 mL/kg/h | INTRAVENOUS | Status: DC
  Administered 2016-04-20: 3 mL/kg/h via INTRAVENOUS

## 2016-04-20 MED ORDER — SODIUM CHLORIDE 0.9 % WEIGHT BASED INFUSION: 1.0000 mL/kg/h | INTRAVENOUS | Status: DC

## 2016-04-20 MED ORDER — MORPHINE SULFATE (PF) 2 MG/ML IV SOLN: 2.0000 mg | INTRAVENOUS | Status: DC | PRN

## 2016-04-20 MED ORDER — ONDANSETRON HCL 4 MG/2ML IJ SOLN: 4.0000 mg | Freq: Four times a day (QID) | INTRAMUSCULAR | Status: DC | PRN

## 2016-04-20 MED ORDER — HEPARIN (PORCINE) IN NACL 2-0.9 UNIT/ML-% IJ SOLN
INTRAMUSCULAR | Status: AC
  Filled 2016-04-20: qty 1000

## 2016-04-20 MED ORDER — SODIUM CHLORIDE 0.9% FLUSH: 3.0000 mL | INTRAVENOUS | Status: DC | PRN

## 2016-04-20 SURGICAL SUPPLY — 9 items
CATH ANGIO 5F PIGTAIL 65CM (CATHETERS) ×2 IMPLANT
KIT PV (KITS) ×2 IMPLANT
SHEATH PINNACLE 5F 10CM (SHEATH) ×2 IMPLANT
STOPCOCK MORSE 400PSI 3WAY (MISCELLANEOUS) ×2 IMPLANT
SYRINGE MEDRAD AVANTA MACH 7 (SYRINGE) ×2 IMPLANT
TRANSDUCER W/STOPCOCK (MISCELLANEOUS) ×2 IMPLANT
TRAY PV CATH (CUSTOM PROCEDURE TRAY) ×2 IMPLANT
TUBING CIL FLEX 10 FLL-RA (TUBING) ×2 IMPLANT
WIRE HITORQ VERSACORE ST 145CM (WIRE) ×2 IMPLANT

## 2016-04-20 NOTE — Interval H&P Note (Signed)
History and Physical Interval Note:  04/20/2016 11:07 AM  Christian Bailey  has presented today for surgery, with the diagnosis of pvd  The various methods of treatment have been discussed with the patient and family. After consideration of risks, benefits and other options for treatment, the patient has consented to  Procedure(s): Lower Extremity Intervention (N/A) as a surgical intervention .  The patient's history has been reviewed, patient examined, no change in status, stable for surgery.  I have reviewed the patient's chart and labs.  Questions were answered to the patient's satisfaction.     Quay Burow

## 2016-04-20 NOTE — Discharge Instructions (Signed)
° °  HOLD METFORMIN FOR 48 HOURS. MAY RESUME ON THUR MORNING.  Groin Surgical Site Care Refer to this sheet in the next few weeks. These instructions provide you with information about caring for yourself after your procedure. Your health care provider may also give you more specific instructions. Your treatment has been planned according to current medical practices, but problems sometimes occur. Call your health care provider if you have any problems or questions after your procedure. WHAT TO EXPECT AFTER THE PROCEDURE After your procedure, it is typical to have the following:  Bruising at the groin site that usually fades within 1-2 weeks.  Blood collecting in the tissue (hematoma) that may be painful to the touch. It should usually decrease in size and tenderness within 1-2 weeks. HOME CARE INSTRUCTIONS  Take medicines only as directed by your health care provider.  You may shower 24-48 hours after the procedure or as directed by your health care provider. Remove the bandage (dressing) and gently wash the site with plain soap and water. Pat the area dry with a clean towel. Do not rub the site, because this may cause bleeding.  Do not take baths, swim, or use a hot tub until your health care provider approves.  Check your insertion site every day for redness, swelling, or drainage.  Do not apply powder or lotion to the site.  Limit use of stairs to twice a day for the first 2-3 days or as directed by your health care provider.  Do not squat for the first 2-3 days or as directed by your health care provider.  Do not lift over 10 lb (4.5 kg) for 5 days after your procedure or as directed by your health care provider.  Ask your health care provider when it is okay to:  Return to work or school.  Resume usual physical activities or sports.  Resume sexual activity.  Do not drive home if you are discharged the same day as the procedure. Have someone else drive you.  You may drive 24  hours after the procedure unless otherwise instructed by your health care provider.  Do not operate machinery or power tools for 24 hours after the procedure or as directed by your health care provider.  If your procedure was done as an outpatient procedure, which means that you went home the same day as your procedure, a responsible adult should be with you for the first 24 hours after you arrive home.  Keep all follow-up visits as directed by your health care provider. This is important. SEEK MEDICAL CARE IF:  You have a fever.  You have chills.  You have increased bleeding from the groin site. Hold pressure on the site. SEEK IMMEDIATE MEDICAL CARE IF:  You have unusual pain at the groin site.  You have redness, warmth, or swelling at the groin site.  You have drainage (other than a small amount of blood on the dressing) from the groin site.  The groin site is bleeding, and the bleeding does not stop after 30 minutes of holding steady pressure on the site.  Your leg or foot becomes pale, cool, tingly, or numb.   This information is not intended to replace advice given to you by your health care provider. Make sure you discuss any questions you have with your health care provider.   Document Released: 02/16/2014 Document Reviewed: 02/16/2014 Elsevier Interactive Patient Education [employer].

## 2016-04-20 NOTE — Progress Notes (Addendum)
Site area: LFA Site Prior to Removal:  Level 0 Pressure Applied For: 20 minutes Manual:   yes Patient Status During Pull:  stable Post Pull Site:  Level 0 Post Pull Instructions Given:  yes Post Pull Pulses Present: weakly palpable Dressing Applied: tegaderm  Bedrest begins @ [id number] till [place] Comments:by Hayley, RN

## 2016-04-20 NOTE — H&P (View-Only) (Signed)
03/25/2016 Christian Bailey   [birth year]  [id number]  Primary Physician Tivis Ringer, MD Primary Cardiologist: Lorretta Harp MD [name], [place]  HPI:  The patient is a very pleasant 76 year old, married Caucasian male, father of 2, grandfather to 1 grandchild who I last saw 12/13/15. [name] KitchenHis daughter-in-law is a primary care physician at [employer]. He has a history of PVOD and stenting of his left common iliac artery 20 years ago. He was smoking 3 packs a day at that time but stopped smoking 20 years ago.   His other problems include hypertension, hyperlipidemia and noninsulin-requiring diabetes. He had a negative Myoview in our office August 18, 2010. Because of claudication, I performed angiograph on him May 12, 2010, revealing an intact left common iliac artery stent, high-grade left external iliac artery stenosis which I stented using a 7 mm x 3 cm long Nitinol self-expanding stent. I also put 2 stents in his right SFA. Followup Dopplers showed improvement but symptomatically he did not really improve much. He still complains of predictable bilateral lower extremity claudication at several hundred yards. His Dopplers have remained stable. Dr. Dagmar Hait follows his lipid profile which was recently done earlier this month revealing a total cholesterol of 153, LDL of 86 and HDL of 34. Recent lower with Doppler studies performed in October 2014 revealed patent stents with a right ABI of 0.81 and a left of 0.67.over the past year he's noticed progressive but without limiting claudication with recent Dopplers performed 04/04/14 revealing a decline in his right ABI from 0.81-0.53 with progression of disease in his right SFA suggesting "in-stent restenosis.I also performed carotid Doppler studies that suggested moderately severe right internal carotid artery stenosis.   As a result of these studies I performed angiography on him 05/14/14 revealing a patent left external iliac artery stent with  at most 30-40% "in-stent restenosis, and known occluded left SFA with two-vessel runoff (occluded posterior tibial), patent proximal right SFA stent with diffuse "95% in-stent restenosis" within the distal right SFA stent. He had three-vessel runoff on the right. I performed a drug eluting balloon angioplasty for the in-stent restenosis with excellent angiographic and Doppler result although his symptoms did not significantly improve. I also angiogram his carotids because of a moderately severe right ICA stenosis by duplex ultrasound revealing a 50% hypodense right internal carotid artery stenosis with a bovine arch. We have continued to follow these noninvasively. He is neurologically asymptomatic. He denies chest pain or shortness of breath. Since I saw him a year [name] has noticed increasing claudication bilaterally. We obtained lower extremity Dopplers 12/09/15 revealing a decline in his right ABI from 0.85 down to 0.68 and occluded distal right SFA. We did begin him on Pletal which she has taken for last several months with minimal benefit. Because of this, my tenderness to perform angiography every intervention on his right leg potentially implanting a Viabahn covered stent.   Current Outpatient Prescriptions  Medication Sig Dispense Refill  . cilostazol (PLETAL) 100 MG tablet Take 1 tablet (100 mg total) by mouth 2 (two) times daily. 180 tablet 3  . clopidogrel (PLAVIX) 75 MG tablet Take 1 tablet (75 mg total) by mouth daily. 90 tablet 3  . co-enzyme Q-10 30 MG capsule Take 30 mg by mouth daily.    [name] glucosamine-chondroitin 500-400 MG tablet Take 1 tablet by mouth 2 (two) times daily.    [name] lisinopril (PRINIVIL,ZESTRIL) 5 MG tablet Take 5 mg by mouth daily.    . metFORMIN (  GLUCOPHAGE) 1000 MG tablet Take 1 tablet (1,000 mg total) by mouth 2 (two) times daily with a meal.    . metoprolol succinate (TOPROL-XL) 25 MG 24 hr tablet Take 25 mg by mouth 2 (two) times daily.     . Omega-3 Fatty Acids (FISH  OIL) 1000 MG CAPS Take 1 capsule (1,000 mg total) by mouth daily.  0  . pantoprazole (PROTONIX) 40 MG tablet TAKE 2 TABLETS DAILY 180 tablet 2  . rosuvastatin (CRESTOR) 20 MG tablet Take 20 mg by mouth daily.     No current facility-administered medications for this visit.     Allergies  Allergen Reactions  . Asa [Aspirin] Other (See Comments)    GI bleeding    Social History   Social History  . Marital status: Married    Spouse name: N/A  . Number of children: 2  . Years of education: N/A   Occupational History  . retired   .  Retired   Social History Main Topics  . Smoking status: Former Research scientist (life sciences)  . Smokeless tobacco: Never Used  . Alcohol use Yes     Comment: rarely  . Drug use: No  . Sexual activity: Not on file   Other Topics Concern  . Not on file   Social History Narrative  . No narrative on file     Review of Systems: General: negative for chills, fever, night sweats or weight changes.  Cardiovascular: negative for chest pain, dyspnea on exertion, edema, orthopnea, palpitations, paroxysmal nocturnal dyspnea or shortness of breath Dermatological: negative for rash Respiratory: negative for cough or wheezing Urologic: negative for hematuria Abdominal: negative for nausea, vomiting, diarrhea, bright red blood per rectum, melena, or hematemesis Neurologic: negative for visual changes, syncope, or dizziness All other systems reviewed and are otherwise negative except as noted above.    Blood pressure (!) 114/44, pulse 99, height 5\' 5"  (1.651 m), weight 159 lb (72.1 kg).  General appearance: alert and no distress Neck: no adenopathy, no carotid bruit, no JVD, supple, symmetrical, trachea midline and thyroid not enlarged, symmetric, no tenderness/mass/nodules Lungs: clear to auscultation bilaterally Heart: regular rate and rhythm, S1, S2 normal, no murmur, click, rub or gallop Extremities: extremities normal, atraumatic, no cyanosis or edema  EKG not  performed today  ASSESSMENT AND PLAN:   PAD (peripheral artery disease), hx remote stent Lt common iliac, 04/2010-stent to lt common iliac & 2 stents to the Rt SFA, 04/2014 - s/p PTA of right SFA for in-stent restenosis  History of peripheral arterial disease status post stenting of his left external iliac artery and right SFA with self-expanding stents in [place] the past. I left angiogram. 05/14/14 revealing a patent left external iliac artery stent with 30-40% "in-stent restenosis, occluded left SFA with two-vessel runoff, patent proximal right SFA stent with 95% "in-stent restenosis" within the distal right SFA stent. There was three-vessel runoff. I performed a drug-eluting balloon angioplasty with an excellent result. Follow-up Dopplers were favorable however his most recent Dopplers performed 12/09/15 revealed a decline in his right ABI from 0.85 down to 0.68 with what appears to be an occluded distal right SFA. I did begin him on Pletal which afforded him minimal relief. He wishes to proceed with angiography revealing intervention.  Essential hypertension History of hypertensive blood pressure measured 114/44. He is on lisinopril and metoprolol. Continue current meds at current dosing  Hyperlipidemia History of hyperlipidemia on statin therapy followed by his PCP  Carotid artery disease History of moderately severe right  internal carotid artery stenosis by duplex salt for sound 06/04/15. At the time of angiography 05/14/14 I did perform carotid angiography as well revealing a 50% proximal hypodense right internal carotid artery stenosis. We continue to follow this by duplex ultrasound.      Lorretta Harp MD FACP,FACC,FAHA, [facility] 03/25/2016 3:55 PM

## 2016-04-27 ENCOUNTER — Ambulatory Visit (INDEPENDENT_AMBULATORY_CARE_PROVIDER_SITE_OTHER)
Admission: RE | Admit: 2016-04-27 | Discharge: 2016-04-27 | Disposition: A | Discharge status: Home / Self Care | Payer: Medicare Other | Source: Ambulatory Visit | Attending: Cardiovascular Disease | Admitting: Cardiovascular Disease

## 2016-04-27 DIAGNOSIS — R9389 Abnormal findings on diagnostic imaging of other specified body structures: Secondary | ICD-10-CM

## 2016-04-27 DIAGNOSIS — R918 Other nonspecific abnormal finding of lung field: Secondary | ICD-10-CM | POA: Diagnosis not present

## 2016-04-27 DIAGNOSIS — R911 Solitary pulmonary nodule: Secondary | ICD-10-CM | POA: Diagnosis not present

## 2016-04-27 DIAGNOSIS — R938 Abnormal findings on diagnostic imaging of other specified body structures: Secondary | ICD-10-CM | POA: Diagnosis not present

## 2016-05-04 ENCOUNTER — Telehealth: Payer: Self-pay | Admitting: Cardiovascular Disease

## 2016-05-04 DIAGNOSIS — H353221 Exudative age-related macular degeneration, left eye, with active choroidal neovascularization: Secondary | ICD-10-CM | POA: Diagnosis not present

## 2016-05-04 NOTE — Telephone Encounter (Signed)
Spoke to patient and gave results from recent CT. He voiced understanding. States he would like to follow up with Dr. Gwenlyn Found regarding the recent vascular procedure he had and unsuccessful stenting. Would like to see what his next steps are, when he needs to come back to office, etc - he never received further instruction about this. Aware I will route for recommendations.

## 2016-05-04 NOTE — Telephone Encounter (Signed)
Pt had CT Scan last Monday,still have not received the results.

## 2016-05-05 NOTE — Telephone Encounter (Signed)
Spoke with patient and appt scheduled for f/u with Dr Gwenlyn Found 05/24/16.

## 2016-05-06 DIAGNOSIS — H353211 Exudative age-related macular degeneration, right eye, with active choroidal neovascularization: Secondary | ICD-10-CM | POA: Diagnosis not present

## 2016-05-12 DIAGNOSIS — N182 Chronic kidney disease, stage 2 (mild): Secondary | ICD-10-CM | POA: Diagnosis not present

## 2016-05-12 DIAGNOSIS — Z23 Encounter for immunization: Secondary | ICD-10-CM | POA: Diagnosis not present

## 2016-05-25 ENCOUNTER — Encounter: Payer: Self-pay | Admitting: *Deleted

## 2016-05-27 ENCOUNTER — Encounter: Payer: Self-pay | Admitting: Cardiovascular Disease

## 2016-05-27 ENCOUNTER — Ambulatory Visit (INDEPENDENT_AMBULATORY_CARE_PROVIDER_SITE_OTHER): Payer: Medicare Other | Admitting: Cardiovascular Disease

## 2016-05-27 VITALS — BP 112/50 | HR 96 | Ht 65.0 in | Wt 160.0 lb

## 2016-05-27 DIAGNOSIS — I739 Peripheral vascular disease, unspecified: Secondary | ICD-10-CM

## 2016-05-27 NOTE — Progress Notes (Signed)
05/27/2016 Christian Bailey   1941/06/16  [id number]  Primary Physician Christian Ringer, MD Primary Cardiologist: Christian Harp MD Christian Bailey  HPI:  The patient is a very pleasant 75 year old, married Caucasian male, father of 2, grandfather to 1 grandchild who I last saw 03/25/16. [name] KitchenHis daughter-in-law is a primary care physician at [employer]. He has a history of PVOD and stenting of his left common iliac artery 20 years ago. He was smoking 3 packs a day at that time but stopped smoking 20 years ago.   His other problems include hypertension, hyperlipidemia and noninsulin-requiring diabetes. He had a negative Myoview in our office August 18, 2010. Because of claudication, I performed angiograph on him May 12, 2010, revealing an intact left common iliac artery stent, high-grade left external iliac artery stenosis which I stented using a 7 mm x 3 cm long Nitinol self-expanding stent. I also put 2 stents in his right SFA. Followup Dopplers showed improvement but symptomatically he did not really improve much. He still complains of predictable bilateral lower extremity claudication at several hundred yards. His Dopplers have remained stable. Dr. Dagmar Bailey follows his lipid profile which was recently done earlier this month revealing a total cholesterol of 153, LDL of 86 and HDL of 34. Recent lower with Doppler studies performed in October 2014 revealed patent stents with a right ABI of 0.81 and a left of 0.67.over the past year he's noticed progressive but without limiting claudication with recent Dopplers performed 04/04/14 revealing a decline in his right ABI from 0.81-0.53 with progression of disease in his right SFA suggesting "in-stent restenosis.I also performed carotid Doppler studies that suggested moderately severe right internal carotid artery stenosis.   As a result of these studies I performed angiography on him 05/14/14 revealing a patent left external iliac artery stent with  at most 30-40% "in-stent restenosis, and known occluded left SFA with two-vessel runoff (occluded posterior tibial), patent proximal right SFA stent with diffuse "95% in-stent restenosis" within the distal right SFA stent. He had three-vessel runoff on the right. I performed a drug eluting balloon angioplasty for the in-stent restenosis with excellent angiographic and Doppler result although his symptoms did not significantly improve. I also angiogram his carotids because of a moderately severe right ICA stenosis by duplex ultrasound revealing a 50% hypodense right internal carotid artery stenosis with a bovine arch. We have continued to follow these noninvasively. He is neurologically asymptomatic. He denies chest pain or shortness of breath. Since I saw him a year Christian Bailey has noticed increasing claudication bilaterally. We obtained lower extremity Dopplers 12/09/15 revealing a decline in his right ABI from 0.85 down to 0.68 and occluded distal right SFA. We did begin him on Pletal which she has taken for last several months with minimal benefit. I angiogram 1010/23/17 revealing a widely patent left iliac stent, occluded left SFA with two-vessel runoff and a occluded right SFA with two-vessel runoff. I did not think he had percutaneous options for revascularization and he remains symptomatic and functionally limited.   Current Outpatient Prescriptions  Medication Sig Dispense Refill  . cilostazol (PLETAL) 100 MG tablet Take 1 tablet (100 mg total) by mouth 2 (two) times daily. 180 tablet 3  . clopidogrel (PLAVIX) 75 MG tablet Take 1 tablet (75 mg total) by mouth daily. (Patient taking differently: Take 75 mg by mouth daily after breakfast. ) 90 tablet 3  . co-enzyme Q-10 30 MG capsule Take 30 mg by mouth daily.    [name]  glucosamine-chondroitin 500-400 MG tablet Take 1 tablet by mouth 2 (two) times daily.    [name] lisinopril (PRINIVIL,ZESTRIL) 5 MG tablet Take 5 mg by mouth 2 (two) times daily.     . metFORMIN  (GLUCOPHAGE) 1000 MG tablet Take 1 tablet (1,000 mg total) by mouth 2 (two) times daily with a meal.    . metoprolol succinate (TOPROL-XL) 25 MG 24 hr tablet Take 25 mg by mouth 2 (two) times daily.     . Omega-3 Fatty Acids (FISH OIL) 1000 MG CAPS Take 1 capsule (1,000 mg total) by mouth daily.  0  . pantoprazole (PROTONIX) 40 MG tablet TAKE 2 TABLETS DAILY (Patient taking differently: TAKE 1 TABLET BY MOUTH DAILY BEFORE BREAKFAST) 180 tablet 2  . rosuvastatin (CRESTOR) 20 MG tablet Take 20 mg by mouth every evening.      No current facility-administered medications for this visit.     Allergies  Allergen Reactions  . Asa [Aspirin] Other (See Comments)    GI bleeding    Social History   Social History  . Marital status: Married    Spouse name: N/A  . Number of children: 2  . Years of education: N/A   Occupational History  . retired   .  Retired   Social History Main Topics  . Smoking status: Former Research scientist (life sciences)  . Smokeless tobacco: Never Used  . Alcohol use Yes     Comment: rarely  . Drug use: No  . Sexual activity: Not on file   Other Topics Concern  . Not on file   Social History Narrative  . No narrative on file     Review of Systems: General: negative for chills, fever, night sweats or weight changes.  Cardiovascular: negative for chest pain, dyspnea on exertion, edema, orthopnea, palpitations, paroxysmal nocturnal dyspnea or shortness of breath Dermatological: negative for rash Respiratory: negative for cough or wheezing Urologic: negative for hematuria Abdominal: negative for nausea, vomiting, diarrhea, bright red blood per rectum, melena, or hematemesis Neurologic: negative for visual changes, syncope, or dizziness All other systems reviewed and are otherwise negative except as noted above.    Blood pressure (!) 112/50, pulse 96, height 5\' 5"  (1.651 m), weight 160 lb (72.6 kg).  General appearance: alert and no distress Neck: no adenopathy, no JVD, supple,  symmetrical, trachea midline, thyroid not enlarged, symmetric, no tenderness/mass/nodules and Soft bilateral carotid bruits Lungs: clear to auscultation bilaterally Heart: regular rate and rhythm, S1, S2 normal, no murmur, click, rub or gallop Extremities: extremities normal, atraumatic, no cyanosis or edema  EKG sinus rhythm at 96 with right bundle branch block. I personally reviewed this EKG  ASSESSMENT AND PLAN:   PAD (peripheral artery disease), hx remote stent Lt common iliac, 04/2010-stent to lt common iliac & 2 stents to the Rt SFA, 04/2014 - s/p PTA of right SFA for in-stent restenosis  Mr. Maresca returns today for post procedure follow up after his recent peripheral angiogram which I performed 04/20/16. He has a history of a left common iliac stent and known total left SFA. He said multiple stents in his right SFA and noticed a decline in his right ABI with increasing symptoms. Angiography performed on 04/20/16 revealed an occluded left SFA from the origin down to the adductor canal. He has 3 vessel runoff laterally. He is not a candidate for endovascular vascularization. He is symptomatic and was minimally responsive to Pletal. At his request, I'm referring him to [name] for consideration of surgical revascularization for lifestyle  limiting claudication.      Christian Harp MD FACP,FACC,FAHA, [facility] 05/27/2016 10:47 AM

## 2016-05-27 NOTE — Patient Instructions (Signed)
Medication Instructions: Your physician recommends that you continue on your current medications as directed. Please refer to the Current Medication list given to you today.   Follow-Up: Your physician wants you to follow-up in: 1 year with Dr. Gwenlyn Found. You will receive a reminder letter in the mail two months in advance. If you don't receive a letter, please call our office to schedule the follow-up appointment.   Referral:  You have been referred to [name] for evaluation.   If you need a refill on your cardiac medications before your next appointment, please call your pharmacy.

## 2016-05-27 NOTE — Assessment & Plan Note (Signed)
Mr. Boor returns today for post procedure follow up after his recent peripheral angiogram which I performed 04/20/16. He has a history of a left common iliac stent and known total left SFA. He said multiple stents in his right SFA and noticed a decline in his right ABI with increasing symptoms. Angiography performed on 04/20/16 revealed an occluded left SFA from the origin down to the adductor canal. He has 3 vessel runoff laterally. He is not a candidate for endovascular vascularization. He is symptomatic and was minimally responsive to Pletal. At his request, I'm referring him to [name] for consideration of surgical revascularization for lifestyle limiting claudication.

## 2016-06-09 ENCOUNTER — Other Ambulatory Visit: Payer: Self-pay

## 2016-06-09 DIAGNOSIS — I739 Peripheral vascular disease, unspecified: Secondary | ICD-10-CM

## 2016-06-10 ENCOUNTER — Encounter (HOSPITAL_COMMUNITY): Payer: TRICARE For Life (TFL)

## 2016-06-11 DIAGNOSIS — L602 Onychogryphosis: Secondary | ICD-10-CM | POA: Diagnosis not present

## 2016-06-11 DIAGNOSIS — E1351 Other specified diabetes mellitus with diabetic peripheral angiopathy without gangrene: Secondary | ICD-10-CM | POA: Diagnosis not present

## 2016-06-23 ENCOUNTER — Ambulatory Visit (HOSPITAL_COMMUNITY)
Admission: RE | Admit: 2016-06-23 | Discharge: 2016-06-23 | Disposition: A | Discharge status: Home / Self Care | Payer: Medicare Other | Source: Ambulatory Visit | Attending: Cardiology | Admitting: Cardiology

## 2016-06-23 ENCOUNTER — Encounter (HOSPITAL_COMMUNITY): Payer: Medicare Other

## 2016-06-23 DIAGNOSIS — I779 Disorder of arteries and arterioles, unspecified: Secondary | ICD-10-CM

## 2016-06-23 DIAGNOSIS — I6523 Occlusion and stenosis of bilateral carotid arteries: Secondary | ICD-10-CM | POA: Diagnosis not present

## 2016-06-23 DIAGNOSIS — I739 Peripheral vascular disease, unspecified: Secondary | ICD-10-CM

## 2016-06-30 ENCOUNTER — Other Ambulatory Visit: Payer: Self-pay | Admitting: Cardiovascular Disease

## 2016-06-30 DIAGNOSIS — H353221 Exudative age-related macular degeneration, left eye, with active choroidal neovascularization: Secondary | ICD-10-CM | POA: Diagnosis not present

## 2016-06-30 DIAGNOSIS — I779 Disorder of arteries and arterioles, unspecified: Secondary | ICD-10-CM

## 2016-06-30 DIAGNOSIS — I739 Peripheral vascular disease, unspecified: Principal | ICD-10-CM

## 2016-07-10 ENCOUNTER — Encounter: Payer: Self-pay | Admitting: Surgery

## 2016-07-10 ENCOUNTER — Ambulatory Visit (INDEPENDENT_AMBULATORY_CARE_PROVIDER_SITE_OTHER): Payer: Medicare Other | Admitting: Surgery

## 2016-07-10 ENCOUNTER — Other Ambulatory Visit: Payer: Self-pay | Admitting: Surgery

## 2016-07-10 ENCOUNTER — Ambulatory Visit (HOSPITAL_COMMUNITY)
Admission: RE | Admit: 2016-07-10 | Discharge: 2016-07-10 | Disposition: A | Discharge status: Home / Self Care | Payer: Medicare Other | Source: Ambulatory Visit | Attending: Surgery | Admitting: Surgery

## 2016-07-10 VITALS — BP 122/59 | HR 81 | Temp 97.0°F | Resp 16 | Ht 65.0 in | Wt 160.0 lb

## 2016-07-10 DIAGNOSIS — Z01818 Encounter for other preprocedural examination: Secondary | ICD-10-CM

## 2016-07-10 DIAGNOSIS — I70213 Atherosclerosis of native arteries of extremities with intermittent claudication, bilateral legs: Secondary | ICD-10-CM | POA: Diagnosis not present

## 2016-07-10 NOTE — Progress Notes (Signed)
[facility] [place]  Patient name: Christian Bailey MRN: [id number] DOB: 1940-07-13 Sex: male   REFERRING PROVIDER:    Dr. Gwenlyn Found   REASON FOR CONSULT:    claudication  HISTORY OF PRESENT ILLNESS:   Christian Bailey is a 76 y.o. male, who is For today for bilateral claudication, left greater than right.  The patient has a history of left external iliac stenting as well as stenting of his right superficial femoral artery.  The stents within his right superficial femoral artery have occluded.  His left superficial femoral artery has a flush occlusion at its origin.  The patient states he has claudication symptoms when walking to the mailbox and back which is approximately 100 paces.  He denies rest pain or ulceration.  The patient has been treated for claudication for approximately 12 years.  He has been on Pletal intermittently.  He does suffer from diabetes.  He takes a statin for hypercholesterolemia.  He is on ACE inhibitor for hypertension.  He has a history of tobacco abuse but none currently.  Past Medical History:  Diagnosis Date  . Anemia   . Diabetes mellitus ([facility])    TYPE 2  . GERD (gastroesophageal reflux disease)   . GI bleed   . History of colon polyps   . History of hiatal hernia   . Hyperlipemia   . Hypertension   . Osteoarthritis   . PAD (peripheral artery disease) ([facility])    a. stenting of his left common iliac artery >20 years ago. b. h/o LEIA stent and 2 stents to R SFA in 2011. c. 04/2014:  s/p PTA of right SFA for in-stent restenosis, occluded left SFA  . RBBB (right bundle branch block with left anterior fascicular block)    NUCLEAR STRESS TEST, 08/18/2010 - no significant wall motion abnoramlities noted, post-stress EF 69%, normal myocardial perfusion study  . Sinus tachycardia    a. Noted during admission 04/2014 but upon review seems to be frequent finding for patient.  . Stenosis of carotid artery    a. 50%  right carotid stenosis by angiogram 04/2014.     FAMILY HISTORY   Family History  Problem Relation Age of Onset  . Heart disease Mother   . Leukemia Mother   . Stomach cancer Father   . Esophageal cancer Brother   . Liver disease Brother   . Alcoholism Brother   . Leukemia Brother   . Leukemia Brother   . Diabetes type II Brother   . Lung disease Sister     SOCIAL HISTORY:   Social History   Social History  . Marital status: Married    Spouse name: N/A  . Number of children: 2  . Years of education: N/A   Occupational History  . retired   .  Retired   Social History Main Topics  . Smoking status: Former Smoker    Quit date: 06/1988  . Smokeless tobacco: Never Used  . Alcohol use Yes     Comment: rarely  . Drug use: No  . Sexual activity: Not on file   Other Topics Concern  . Not on file   Social History Narrative  . No narrative on file    Allergies  Allergen Reactions  . Asa [Aspirin] Other (See Comments)    GI bleeding    Current Outpatient Prescriptions  Medication Sig Dispense Refill  . cilostazol (PLETAL) 100 MG tablet Take 1 tablet (100 mg total) by mouth 2 (two) times  daily. 180 tablet 3  . clopidogrel (PLAVIX) 75 MG tablet Take 1 tablet (75 mg total) by mouth daily. (Patient taking differently: Take 75 mg by mouth daily after breakfast. ) 90 tablet 3  . co-enzyme Q-10 30 MG capsule Take 30 mg by mouth daily.    [name] glucosamine-chondroitin 500-400 MG tablet Take 1 tablet by mouth 2 (two) times daily.    [name] lisinopril (PRINIVIL,ZESTRIL) 5 MG tablet Take 5 mg by mouth 2 (two) times daily.     . metFORMIN (GLUCOPHAGE) 1000 MG tablet Take 1 tablet (1,000 mg total) by mouth 2 (two) times daily with a meal.    . metoprolol succinate (TOPROL-XL) 25 MG 24 hr tablet Take 25 mg by mouth 2 (two) times daily.     . Omega-3 Fatty Acids (FISH OIL) 1000 MG CAPS Take 1 capsule (1,000 mg total) by mouth daily.  0  . pantoprazole (PROTONIX) 40 MG tablet TAKE 2  TABLETS DAILY (Patient taking differently: TAKE 1 TABLET BY MOUTH DAILY BEFORE BREAKFAST) 180 tablet 2  . rosuvastatin (CRESTOR) 20 MG tablet Take 20 mg by mouth every evening.      No current facility-administered medications for this visit.     REVIEW OF SYSTEMS:   [X]  denotes positive finding, [ ]  denotes negative finding Cardiac  Comments:  Chest pain or chest pressure:    Shortness of breath upon exertion:    Short of breath when lying flat:    Irregular heart rhythm:        Vascular    Pain in calf, thigh, or hip brought on by ambulation: x   Pain in feet at night that wakes you up from your sleep:     Blood clot in your veins:    Leg swelling:         Pulmonary    Oxygen at home:    Productive cough:     Wheezing:         Neurologic    Sudden weakness in arms or legs:     Sudden numbness in arms or legs:     Sudden onset of difficulty speaking or slurred speech:    Temporary loss of vision in one eye:     Problems with dizziness:         Gastrointestinal    Blood in stool:      Vomited blood:         Genitourinary    Burning when urinating:     Blood in urine:        Psychiatric    Major depression:         Hematologic    Bleeding problems:    Problems with blood clotting too easily:        Skin    Rashes or ulcers:        Constitutional    Fever or chills:     PHYSICAL EXAM:   Vitals:   07/10/16 1346  BP: (!) 122/59  Pulse: 81  Resp: 16  Temp: 97 F (36.1 C)  TempSrc: Oral  SpO2: 96%  Weight: 160 lb (72.6 kg)  Height: 5\' 5"  (1.651 m)    GENERAL: The patient is a well-nourished male, in no acute distress. The vital signs are documented above. CARDIAC: There is a regular rate and rhythm.  VASCULAR: Nonpalpable pedal pulses PULMONARY: Nonlabored respirations ABDOMEN: Soft and non-tender with normal pitched bowel sounds.  MUSCULOSKELETAL: There are no major deformities or cyanosis. NEUROLOGIC: No focal weakness  or paresthesias are  detected. SKIN: There are no ulcers or rashes noted. PSYCHIATRIC: The patient has a normal affect.  STUDIES:   I have reviewed his carotid Doppler study which shows 60-79% right carotid stenosis.  I have reviewed his arteriogram.  There is a flush occlusion of the left superficial femoral artery and the right superficial femoral artery is occluded.  Both reconstitute above the adductor canal  Vein mapping: This shows that for the most part the bilateral saphenous veins are over 3 mm.  In each vein there is one segment that drops down to about 0.28.  ASSESSMENT and PLAN   Bilateral claudication, left greater than right.  We discussed the treatment options including continuing medical therapy versus surgical revascularization.  After lengthy discussion, we have decided to proceed with left femoral-popliteal bypass graft.  We discussed risks and benefits of the procedure including the risk of wound complications, like swelling, bypass graft failure, long-term surveillance.  All his questions were answered.  He will need to be off of his Pletal and Plavix prior to surgery   And him: The patient is going to contact my office to determine the date of his operation which will likely be in late February or early March.  He is also going to contemplate which leg he would like to have done first, therefore the laterality may change  [name], MD [facility] [phone] Pager [phone]

## 2016-07-14 ENCOUNTER — Encounter: Payer: Self-pay | Admitting: Internal Medicine

## 2016-07-15 ENCOUNTER — Encounter: Payer: TRICARE For Life (TFL) | Admitting: Vascular Surgery

## 2016-07-15 ENCOUNTER — Encounter (HOSPITAL_COMMUNITY): Payer: TRICARE For Life (TFL)

## 2016-07-20 NOTE — Addendum Note (Signed)
Addended by: Waylan Rocher on: 07/20/2016 12:35 PM   Modules accepted: Orders

## 2016-07-28 DIAGNOSIS — E1151 Type 2 diabetes mellitus with diabetic peripheral angiopathy without gangrene: Secondary | ICD-10-CM | POA: Diagnosis not present

## 2016-07-28 DIAGNOSIS — I10 Essential (primary) hypertension: Secondary | ICD-10-CM | POA: Diagnosis not present

## 2016-07-28 DIAGNOSIS — Z1389 Encounter for screening for other disorder: Secondary | ICD-10-CM | POA: Diagnosis not present

## 2016-07-28 DIAGNOSIS — I2510 Atherosclerotic heart disease of native coronary artery without angina pectoris: Secondary | ICD-10-CM | POA: Diagnosis not present

## 2016-07-28 DIAGNOSIS — K279 Peptic ulcer, site unspecified, unspecified as acute or chronic, without hemorrhage or perforation: Secondary | ICD-10-CM | POA: Diagnosis not present

## 2016-07-28 DIAGNOSIS — I739 Peripheral vascular disease, unspecified: Secondary | ICD-10-CM | POA: Diagnosis not present

## 2016-07-28 DIAGNOSIS — Z6825 Body mass index (BMI) 25.0-25.9, adult: Secondary | ICD-10-CM | POA: Diagnosis not present

## 2016-07-28 DIAGNOSIS — M1990 Unspecified osteoarthritis, unspecified site: Secondary | ICD-10-CM | POA: Diagnosis not present

## 2016-07-28 DIAGNOSIS — E784 Other hyperlipidemia: Secondary | ICD-10-CM | POA: Diagnosis not present

## 2016-08-03 ENCOUNTER — Encounter: Payer: TRICARE For Life (TFL) | Admitting: Surgery

## 2016-08-05 DIAGNOSIS — H353221 Exudative age-related macular degeneration, left eye, with active choroidal neovascularization: Secondary | ICD-10-CM | POA: Diagnosis not present

## 2016-08-05 DIAGNOSIS — H353113 Nonexudative age-related macular degeneration, right eye, advanced atrophic without subfoveal involvement: Secondary | ICD-10-CM | POA: Diagnosis not present

## 2016-08-05 DIAGNOSIS — H3562 Retinal hemorrhage, left eye: Secondary | ICD-10-CM | POA: Diagnosis not present

## 2016-08-05 DIAGNOSIS — H353124 Nonexudative age-related macular degeneration, left eye, advanced atrophic with subfoveal involvement: Secondary | ICD-10-CM | POA: Diagnosis not present

## 2016-08-05 DIAGNOSIS — H353211 Exudative age-related macular degeneration, right eye, with active choroidal neovascularization: Secondary | ICD-10-CM | POA: Diagnosis not present

## 2016-08-06 ENCOUNTER — Other Ambulatory Visit: Payer: Self-pay

## 2016-08-19 DIAGNOSIS — E1351 Other specified diabetes mellitus with diabetic peripheral angiopathy without gangrene: Secondary | ICD-10-CM | POA: Diagnosis not present

## 2016-08-19 DIAGNOSIS — L602 Onychogryphosis: Secondary | ICD-10-CM | POA: Diagnosis not present

## 2016-08-20 DIAGNOSIS — M19011 Primary osteoarthritis, right shoulder: Secondary | ICD-10-CM | POA: Diagnosis not present

## 2016-08-20 DIAGNOSIS — M25511 Pain in right shoulder: Secondary | ICD-10-CM | POA: Diagnosis not present

## 2016-08-24 DIAGNOSIS — H353221 Exudative age-related macular degeneration, left eye, with active choroidal neovascularization: Secondary | ICD-10-CM | POA: Diagnosis not present

## 2016-09-10 DIAGNOSIS — M19011 Primary osteoarthritis, right shoulder: Secondary | ICD-10-CM | POA: Diagnosis not present

## 2016-09-10 DIAGNOSIS — M25511 Pain in right shoulder: Secondary | ICD-10-CM | POA: Diagnosis not present

## 2016-09-11 ENCOUNTER — Encounter (HOSPITAL_COMMUNITY)
Admission: RE | Admit: 2016-09-11 | Discharge: 2016-09-11 | Disposition: A | Discharge status: Home / Self Care | Payer: Medicare Other | Source: Ambulatory Visit | Attending: Surgery | Admitting: Surgery

## 2016-09-11 ENCOUNTER — Encounter (HOSPITAL_COMMUNITY): Payer: Self-pay

## 2016-09-11 DIAGNOSIS — D649 Anemia, unspecified: Secondary | ICD-10-CM | POA: Diagnosis not present

## 2016-09-11 DIAGNOSIS — Z01812 Encounter for preprocedural laboratory examination: Secondary | ICD-10-CM | POA: Diagnosis not present

## 2016-09-11 DIAGNOSIS — E785 Hyperlipidemia, unspecified: Secondary | ICD-10-CM | POA: Insufficient documentation

## 2016-09-11 DIAGNOSIS — Z79899 Other long term (current) drug therapy: Secondary | ICD-10-CM | POA: Insufficient documentation

## 2016-09-11 DIAGNOSIS — K219 Gastro-esophageal reflux disease without esophagitis: Secondary | ICD-10-CM | POA: Insufficient documentation

## 2016-09-11 DIAGNOSIS — Z7984 Long term (current) use of oral hypoglycemic drugs: Secondary | ICD-10-CM | POA: Diagnosis not present

## 2016-09-11 DIAGNOSIS — I10 Essential (primary) hypertension: Secondary | ICD-10-CM | POA: Insufficient documentation

## 2016-09-11 DIAGNOSIS — E119 Type 2 diabetes mellitus without complications: Secondary | ICD-10-CM | POA: Diagnosis not present

## 2016-09-11 DIAGNOSIS — Z87891 Personal history of nicotine dependence: Secondary | ICD-10-CM | POA: Diagnosis not present

## 2016-09-11 DIAGNOSIS — I739 Peripheral vascular disease, unspecified: Secondary | ICD-10-CM | POA: Diagnosis not present

## 2016-09-11 LAB — URINALYSIS, ROUTINE W REFLEX MICROSCOPIC
Bilirubin Urine: NEGATIVE
Glucose, UA: NEGATIVE mg/dL
Hgb urine dipstick: NEGATIVE
Ketones, ur: NEGATIVE mg/dL
Leukocytes, UA: NEGATIVE
NITRITE: NEGATIVE
PROTEIN: NEGATIVE mg/dL
Specific Gravity, Urine: 1.008 (ref 1.005–1.030)
pH: 5 (ref 5.0–8.0)

## 2016-09-11 LAB — APTT: APTT: 29 s (ref 24–36)

## 2016-09-11 LAB — COMPREHENSIVE METABOLIC PANEL
ALBUMIN: 4.1 g/dL (ref 3.5–5.0)
ALK PHOS: 68 U/L (ref 38–126)
ALT: 21 U/L (ref 17–63)
AST: 25 U/L (ref 15–41)
Anion gap: 8 (ref 5–15)
BILIRUBIN TOTAL: 0.5 mg/dL (ref 0.3–1.2)
BUN: 26 mg/dL — AB (ref 6–20)
CALCIUM: 9.3 mg/dL (ref 8.9–10.3)
CO2: 28 mmol/L (ref 22–32)
CREATININE: 1.21 mg/dL (ref 0.61–1.24)
Chloride: 104 mmol/L (ref 101–111)
GFR calc Af Amer: >60 mL/min (ref >60)
GFR calc non Af Amer: 57 mL/min — ABNORMAL LOW (ref >60)
GLUCOSE: 129 mg/dL — AB (ref 65–99)
Potassium: 4.1 mmol/L (ref 3.5–5.1)
Sodium: 140 mmol/L (ref 135–145)
TOTAL PROTEIN: 7.4 g/dL (ref 6.5–8.1)

## 2016-09-11 LAB — GLUCOSE, CAPILLARY: Glucose-Capillary: 128 mg/dL — ABNORMAL HIGH (ref 65–99)

## 2016-09-11 LAB — SURGICAL PCR SCREEN
MRSA, PCR: NEGATIVE
Staphylococcus aureus: POSITIVE — AB

## 2016-09-11 LAB — CBC
HEMATOCRIT: 35.7 % — AB (ref 39.0–52.0)
HEMOGLOBIN: 11.4 g/dL — AB (ref 13.0–17.0)
MCH: 28.4 pg (ref 26.0–34.0)
MCHC: 31.9 g/dL (ref 30.0–36.0)
MCV: 89 fL (ref 78.0–100.0)
Platelets: 284 10*3/uL (ref 150–400)
RBC: 4.01 MIL/uL — AB (ref 4.22–5.81)
RDW: 13 % (ref 11.5–15.5)
WBC: 8.4 10*3/uL (ref 4.0–10.5)

## 2016-09-11 LAB — TYPE AND SCREEN
ABO/RH(D): O POS
Antibody Screen: NEGATIVE

## 2016-09-11 LAB — PROTIME-INR
INR: 0.99
Prothrombin Time: 13.1 seconds (ref 11.4–15.2)

## 2016-09-11 NOTE — Pre-Procedure Instructions (Signed)
Christian Bailey  09/11/2016      [id number] - [place], [place] [address] Phone: [phone] Fax: [phone]  [facility] [place], [place] [address] [place] [address] Phone: [phone] Fax: [phone]  [facility] [address] - [place], [place] [address] [place] [address] Phone: [phone] Fax: [phone]    Your procedure is scheduled on March 23.  Report to [facility] at [phone].  Call this number if you have problems the morning of surgery:  [phone]   Remember:  Do not eat food or drink liquids after midnight.  Take these medicines the morning of surgery with A SIP OF WATER Metoprolol Succinate (Toprol-XL), Pantoprazole (Protonix)  Stop taking aspirin, BC's, Goody's. Ibuprofen, Advil, motrin, Aleve, Herbal medications, Fish Oil, Naproxen (Anaprox) Stop/Take Pletal and Plavix as directed by your Dr.    Francene Finders to Manage Your Diabetes Before and After Surgery  Why is it important to control my blood sugar before and after surgery? . Improving blood sugar levels before and after surgery helps healing and can limit problems. . A way of improving blood sugar control is eating a healthy diet by: o  Eating less sugar and carbohydrates o  Increasing activity/exercise o  Talking with your doctor about reaching your blood sugar goals . High blood sugars (greater than 180 mg/dL) can raise your risk of infections and slow your recovery, so you will need to focus on controlling your diabetes during the weeks before surgery. . Make sure that the doctor who takes care of your diabetes knows about your planned surgery including the date and location.  How do I manage my blood sugar before surgery? . Check your blood sugar at least 4 times a day, starting 2 days before  surgery, to make sure that the level is not too high or low. o Check your blood sugar the morning of your surgery when you wake up and every 2 hours until you get to the Short Stay unit. . If your blood sugar is less than 70 mg/dL, you will need to treat for low blood sugar: o Do not take insulin. o Treat a low blood sugar (less than 70 mg/dL) with  cup of clear juice (cranberry or apple), 4 glucose tablets, OR glucose gel. o Recheck blood sugar in 15 minutes after treatment (to make sure it is greater than 70 mg/dL). If your blood sugar is not greater than 70 mg/dL on recheck, call [phone] for further instructions. . Report your blood sugar to the short stay nurse when you get to Short Stay.  . If you are admitted to the hospital after surgery: o Your blood sugar will be checked by the staff and you will probably be given insulin after surgery (instead of oral diabetes medicines) to make sure you have good blood sugar levels. o The goal for blood sugar control after surgery is 80-180 mg/dL.        WHAT DO I DO ABOUT MY DIABETES MEDICATION?   [name] Do not take oral diabetes medicines (pills) the morning of surgery. Metformin (Glucophage)  . If your CBG is greater than 220 mg/dL, you may take  of your sliding scale (correction) dose of insulin.  Other Instructions:          Patient Signature:  Date:   Nurse Signature:  Date:   Reviewed  and Endorsed by [facility] Patient Education Committee, August 2015  Do not wear jewelry, make-up or nail polish.  Do not wear lotions, powders, or perfumes, or deoderant.  Do not shave 48 hours prior to surgery.  Men may shave face and neck.  Do not bring valuables to the hospital.  [facility] is not responsible for any belongings or valuables.  Contacts, dentures or bridgework may not be worn into surgery.  Leave your suitcase in the car.  After surgery it may be brought to your room.  For patients admitted to the hospital, discharge  time will be determined by your treatment team.  Patients discharged the day of surgery will not be allowed to drive home.    Special instructions:  [place] - Preparing for Surgery  Before surgery, you can play an important role.  Because skin is not sterile, your skin needs to be as free of germs as possible.  You can reduce the number of germs on you skin by washing with CHG (chlorahexidine gluconate) soap before surgery.  CHG is an antiseptic cleaner which kills germs and bonds with the skin to continue killing germs even after washing.  Please DO NOT use if you have an allergy to CHG or antibacterial soaps.  If your skin becomes reddened/irritated stop using the CHG and inform your nurse when you arrive at Short Stay.  Do not shave (including legs and underarms) for at least 48 hours prior to the first CHG shower.  You may shave your face.  Please follow these instructions carefully:   1.  Shower with CHG Soap the night before surgery and the    morning of Surgery.  2.  If you choose to wash your hair, wash your hair first as usual with your   normal shampoo.  3.  After you shampoo, rinse your hair and body thoroughly to remove the  Shampoo.  4.  Use CHG as you would any other liquid soap.  You can apply chg directly   to the skin and wash gently with scrungie or a clean washcloth.  5.  Apply the CHG Soap to your body ONLY FROM THE NECK DOWN.   Do not use on open wounds or open sores.  Avoid contact with your eyes,       ears, mouth and genitals (private parts).  Wash genitals (private parts)       with your normal soap.  6.  Wash thoroughly, paying special attention to the area where your surgery   will be performed.  7.  Thoroughly rinse your body with warm water from the neck down.  8.  DO NOT shower/wash with your normal soap after using and rinsing off   the CHG Soap.  9.  Pat yourself dry with a clean towel.            10.  Wear clean pajamas.            11.  Place clean sheets  on your bed the night of your first shower and do not  sleep with pets.  Day of Surgery  Do not apply any lotions/deoderants the morning of surgery.  Please wear clean clothes to the hospital/surgery center.     Please read over the following fact sheets that you were given. Pain Booklet, Coughing and Deep Breathing, MRSA Information and Surgical Site Infection Prevention

## 2016-09-11 NOTE — Progress Notes (Addendum)
PCP IS Dr. Dagmar Hait Cardiologist  is Dr. Gwenlyn Found- last saw 07-23-16 Denies any chest pain. Reports his fasting CBGs run 120-150 States he was told to stop taking Pletal and Plavix on 09-12-16 States he had a stress test 12-15 years ago Echo noted from 2009

## 2016-09-11 NOTE — Progress Notes (Signed)
Christian Bailey called and informed of positive results from PCR.  Script called into [facility] For mupirocin Oint.

## 2016-09-12 LAB — HEMOGLOBIN A1C
Hgb A1c MFr Bld: 6.9 % — ABNORMAL HIGH (ref 4.8–5.6)
MEAN PLASMA GLUCOSE: 151 mg/dL

## 2016-09-15 NOTE — Progress Notes (Signed)
Anesthesia Chart Review:  Pt is a 76 year old male scheduled for R fem-pop bypass graft on 09/18/2016 with Harold Barban, MD  - PCP is Prince Solian, MD - Cardiologist is Quay Burow, M.D. Who referred patient for surgery.  PMH includes:  HTN, sinus tachycardia, DM, hyperlipidemia, PAD (s/p a.  left CIA stent >20 years ago. b. h/o LEIA stent and 2 stents to R SFA in 2011. c. 04/2014: s/p PTA of right SFA for in-stent restenosis, occluded left SFA), anemia, GI bleed, GERD. Former smoker. BMI 27.  Medications include: Pletal, Plavix, lisinopril, metformin, metoprolol, Protonix, rosuvastatin. Pt to hold pletal and plavix 5 days before surgery.   Preoperative labs reviewed.  HbA1c 6.9, glucose 129  CT chest 04/27/16:  1. No suspicious pulmonary nodules or clear correlate to the questioned radiographic finding. 2. There are small pulmonary nodules bilaterally. This appearance is almost certainly benign, and no dedicated follow-up is required if this patient is low risk for bronchogenic carcinoma. Non-contrast chest CT can be considered in 12 months if patient is high-risk. 3. Probable dependent secretions within the right mainstem bronchus. 4. Low-density hepatic lesions, likely cysts.  CXR 04/13/16: Aortic atherosclerosis. Nodular density seen laterally in left midlung which may represent pulmonary nodule. CT scan is recommended for further evaluation.  EKG 05/27/16: Sinus rhythm with PACs with aberrant conduction. RBBB.  Carotid duplex 06/23/16:  - Stable carotid artery disease. - 60-79% RICA stenosis. - 1-61% LICA stenosis. - Patent vertebral arteries with antegrade flow. - Normal subclavian arteries, bilaterally.  Nuclear stress test 08/18/10: Post-stress myocardial perfusion images show a normal pattern of perfusion in all regions. Post-stress LV is normal in size. EF 69%. Global LV systolic function normal. No significant wall motion abnormalities. Low risk scan.  Echo 10/04/07:   1. NSR-IVCD 2. Mild concentric LVH. LV systolic function normal. EF >55%. Borderline impaired LV relaxation. 3. RA mildly dilated 4. Mild mitral annular calcification. 5. Aortic valve appears to be mildly sclerotic  If no changes, I anticipate pt can proceed with surgery as scheduled.   Willeen Cass, FNP-BC [facility] Short Stay Surgical Center/Anesthesiology Phone: [phone] 09/15/2016 4:05 PM

## 2016-09-18 ENCOUNTER — Inpatient Hospital Stay (HOSPITAL_COMMUNITY): Payer: Medicare Other | Admitting: Emergency Medicine

## 2016-09-18 ENCOUNTER — Inpatient Hospital Stay (HOSPITAL_COMMUNITY)
Admission: RE | Admit: 2016-09-18 | Discharge: 2016-09-22 | DRG: 253 | Disposition: A | Discharge status: Home / Self Care | Payer: Medicare Other | Source: Ambulatory Visit | Attending: Surgery | Admitting: Surgery

## 2016-09-18 ENCOUNTER — Inpatient Hospital Stay (HOSPITAL_COMMUNITY): Payer: Medicare Other | Admitting: Certified Registered Nurse Anesthetist

## 2016-09-18 ENCOUNTER — Encounter (HOSPITAL_COMMUNITY)
Admission: RE | Disposition: A | Discharge status: Home / Self Care | Payer: Self-pay | Source: Ambulatory Visit | Attending: Surgery

## 2016-09-18 ENCOUNTER — Encounter (HOSPITAL_COMMUNITY): Payer: Self-pay | Admitting: Urology

## 2016-09-18 DIAGNOSIS — I70213 Atherosclerosis of native arteries of extremities with intermittent claudication, bilateral legs: Secondary | ICD-10-CM | POA: Diagnosis not present

## 2016-09-18 DIAGNOSIS — I70211 Atherosclerosis of native arteries of extremities with intermittent claudication, right leg: Secondary | ICD-10-CM | POA: Diagnosis not present

## 2016-09-18 DIAGNOSIS — K219 Gastro-esophageal reflux disease without esophagitis: Secondary | ICD-10-CM | POA: Diagnosis present

## 2016-09-18 DIAGNOSIS — E785 Hyperlipidemia, unspecified: Secondary | ICD-10-CM | POA: Diagnosis present

## 2016-09-18 DIAGNOSIS — Z87891 Personal history of nicotine dependence: Secondary | ICD-10-CM

## 2016-09-18 DIAGNOSIS — I739 Peripheral vascular disease, unspecified: Secondary | ICD-10-CM | POA: Diagnosis present

## 2016-09-18 DIAGNOSIS — E1151 Type 2 diabetes mellitus with diabetic peripheral angiopathy without gangrene: Principal | ICD-10-CM | POA: Diagnosis present

## 2016-09-18 DIAGNOSIS — I452 Bifascicular block: Secondary | ICD-10-CM | POA: Diagnosis present

## 2016-09-18 DIAGNOSIS — I7789 Other specified disorders of arteries and arterioles: Secondary | ICD-10-CM | POA: Diagnosis present

## 2016-09-18 DIAGNOSIS — D62 Acute posthemorrhagic anemia: Secondary | ICD-10-CM | POA: Diagnosis not present

## 2016-09-18 DIAGNOSIS — Z86010 Personal history of colonic polyps: Secondary | ICD-10-CM | POA: Diagnosis not present

## 2016-09-18 DIAGNOSIS — R0989 Other specified symptoms and signs involving the circulatory and respiratory systems: Secondary | ICD-10-CM | POA: Diagnosis not present

## 2016-09-18 DIAGNOSIS — I10 Essential (primary) hypertension: Secondary | ICD-10-CM | POA: Diagnosis present

## 2016-09-18 DIAGNOSIS — Z95828 Presence of other vascular implants and grafts: Secondary | ICD-10-CM

## 2016-09-18 HISTORY — PX: FEMORAL-POPLITEAL BYPASS GRAFT: SHX937

## 2016-09-18 LAB — GLUCOSE, CAPILLARY
GLUCOSE-CAPILLARY: 103 mg/dL — AB (ref 65–99)
GLUCOSE-CAPILLARY: 147 mg/dL — AB (ref 65–99)
GLUCOSE-CAPILLARY: 140 mg/dL — AB (ref 65–99)
Glucose-Capillary: 113 mg/dL — ABNORMAL HIGH (ref 65–99)
Glucose-Capillary: 98 mg/dL (ref 65–99)

## 2016-09-18 LAB — CBC
HCT: 31.7 % — ABNORMAL LOW (ref 39.0–52.0)
Hemoglobin: 10.3 g/dL — ABNORMAL LOW (ref 13.0–17.0)
MCH: 29.2 pg (ref 26.0–34.0)
MCHC: 32.5 g/dL (ref 30.0–36.0)
MCV: 89.8 fL (ref 78.0–100.0)
PLATELETS: 237 10*3/uL (ref 150–400)
RBC: 3.53 MIL/uL — AB (ref 4.22–5.81)
RDW: 13 % (ref 11.5–15.5)
WBC: 16.1 10*3/uL — AB (ref 4.0–10.5)

## 2016-09-18 LAB — CREATININE, SERUM
CREATININE: 1.25 mg/dL — AB (ref 0.61–1.24)
GFR calc non Af Amer: 55 mL/min — ABNORMAL LOW (ref >60)

## 2016-09-18 SURGERY — BYPASS GRAFT FEMORAL-POPLITEAL ARTERY
Anesthesia: General | Laterality: Right

## 2016-09-18 MED ORDER — SODIUM CHLORIDE 0.9 % IV SOLN: INTRAVENOUS | Status: DC

## 2016-09-18 MED ORDER — GUAIFENESIN-DM 100-10 MG/5ML PO SYRP: 15.0000 mL | ORAL_SOLUTION | ORAL | Status: DC | PRN

## 2016-09-18 MED ORDER — CLOPIDOGREL BISULFATE 75 MG PO TABS
75.0000 mg | ORAL_TABLET | Freq: Every day | ORAL | Status: DC
  Administered 2016-09-19 – 2016-09-22 (×4): 75 mg via ORAL
  Filled 2016-09-18 (×4): qty 1

## 2016-09-18 MED ORDER — ONDANSETRON HCL 4 MG/2ML IJ SOLN
INTRAMUSCULAR | Status: AC
  Filled 2016-09-18: qty 2

## 2016-09-18 MED ORDER — POLYETHYLENE GLYCOL 3350 17 G PO PACK
17.0000 g | PACK | Freq: Every day | ORAL | Status: DC | PRN
  Administered 2016-09-22: 17 g via ORAL
  Filled 2016-09-18: qty 1

## 2016-09-18 MED ORDER — HYDROMORPHONE HCL 1 MG/ML IJ SOLN: 0.2500 mg | INTRAMUSCULAR | Status: DC | PRN

## 2016-09-18 MED ORDER — LABETALOL HCL 5 MG/ML IV SOLN
10.0000 mg | INTRAVENOUS | Status: DC | PRN
  Administered 2016-09-18 (×2): 10 mg via INTRAVENOUS

## 2016-09-18 MED ORDER — CHLORHEXIDINE GLUCONATE CLOTH 2 % EX PADS: 6.0000 | MEDICATED_PAD | Freq: Once | CUTANEOUS | Status: DC

## 2016-09-18 MED ORDER — SUGAMMADEX SODIUM 200 MG/2ML IV SOLN
INTRAVENOUS | Status: DC | PRN
  Administered 2016-09-18: 150 mg via INTRAVENOUS

## 2016-09-18 MED ORDER — METOPROLOL TARTRATE 5 MG/5ML IV SOLN
2.0000 mg | INTRAVENOUS | Status: DC | PRN
  Administered 2016-09-19: 5 mg via INTRAVENOUS
  Filled 2016-09-18: qty 5

## 2016-09-18 MED ORDER — SODIUM CHLORIDE 0.9 % IV SOLN
INTRAVENOUS | Status: DC
  Administered 2016-09-18: 20:00:00 via INTRAVENOUS

## 2016-09-18 MED ORDER — HYDROCODONE-ACETAMINOPHEN 5-325 MG PO TABS
1.0000 | ORAL_TABLET | Freq: Four times a day (QID) | ORAL | Status: DC | PRN
  Administered 2016-09-18: 1 via ORAL

## 2016-09-18 MED ORDER — FENTANYL CITRATE (PF) 100 MCG/2ML IJ SOLN
INTRAMUSCULAR | Status: AC
  Filled 2016-09-18: qty 2

## 2016-09-18 MED ORDER — SODIUM CHLORIDE 0.9 % IV SOLN: 500.0000 mL | Freq: Once | INTRAVENOUS | Status: DC | PRN

## 2016-09-18 MED ORDER — 0.9 % SODIUM CHLORIDE (POUR BTL) OPTIME
TOPICAL | Status: DC | PRN
  Administered 2016-09-18: 2000 mL

## 2016-09-18 MED ORDER — PROPOFOL 10 MG/ML IV BOLUS
INTRAVENOUS | Status: DC | PRN
  Administered 2016-09-18: 150 mg via INTRAVENOUS

## 2016-09-18 MED ORDER — MORPHINE SULFATE (PF) 2 MG/ML IV SOLN: 2.0000 mg | INTRAVENOUS | Status: DC | PRN

## 2016-09-18 MED ORDER — ACETAMINOPHEN 650 MG RE SUPP
325.0000 mg | RECTAL | Status: DC | PRN
Start: 2016-09-18 — End: 2016-09-22

## 2016-09-18 MED ORDER — FENTANYL CITRATE (PF) 100 MCG/2ML IJ SOLN
INTRAMUSCULAR | Status: DC | PRN
  Administered 2016-09-18 (×8): 50 ug via INTRAVENOUS

## 2016-09-18 MED ORDER — FENTANYL CITRATE (PF) 100 MCG/2ML IJ SOLN
INTRAMUSCULAR | Status: AC
  Filled 2016-09-18: qty 4

## 2016-09-18 MED ORDER — HYDRALAZINE HCL 20 MG/ML IJ SOLN: 5.0000 mg | INTRAMUSCULAR | Status: DC | PRN

## 2016-09-18 MED ORDER — ACETAMINOPHEN 325 MG PO TABS
325.0000 mg | ORAL_TABLET | ORAL | Status: DC | PRN
  Administered 2016-09-21: 325 mg via ORAL
  Filled 2016-09-18: qty 1

## 2016-09-18 MED ORDER — ESMOLOL HCL 100 MG/10ML IV SOLN
INTRAVENOUS | Status: DC | PRN
  Administered 2016-09-18: 20 mg via INTRAVENOUS
  Administered 2016-09-18: 30 mg via INTRAVENOUS
  Administered 2016-09-18: 20 mg via INTRAVENOUS
  Administered 2016-09-18: 30 mg via INTRAVENOUS

## 2016-09-18 MED ORDER — LABETALOL HCL 5 MG/ML IV SOLN
INTRAVENOUS | Status: AC
  Administered 2016-09-18: 10 mg via INTRAVENOUS
  Filled 2016-09-18: qty 4

## 2016-09-18 MED ORDER — PHENYLEPHRINE HCL 10 MG/ML IJ SOLN
INTRAMUSCULAR | Status: AC
  Filled 2016-09-18: qty 1

## 2016-09-18 MED ORDER — GLUCOSAMINE-CHONDROITIN 500-400 MG PO TABS: 1.0000 | ORAL_TABLET | Freq: Two times a day (BID) | ORAL | Status: DC

## 2016-09-18 MED ORDER — PANTOPRAZOLE SODIUM 40 MG PO TBEC
40.0000 mg | DELAYED_RELEASE_TABLET | Freq: Two times a day (BID) | ORAL | Status: DC
  Administered 2016-09-18 – 2016-09-22 (×7): 40 mg via ORAL
  Filled 2016-09-18 (×8): qty 1

## 2016-09-18 MED ORDER — BISACODYL 10 MG RE SUPP: 10.0000 mg | Freq: Every day | RECTAL | Status: DC | PRN

## 2016-09-18 MED ORDER — HEPARIN SODIUM (PORCINE) 1000 UNIT/ML IJ SOLN
INTRAMUSCULAR | Status: DC | PRN
  Administered 2016-09-18: 7 mL via INTRAVENOUS
  Administered 2016-09-18: 1 mL via INTRAVENOUS

## 2016-09-18 MED ORDER — INSULIN ASPART 100 UNIT/ML ~~LOC~~ SOLN
0.0000 [IU] | Freq: Three times a day (TID) | SUBCUTANEOUS | Status: DC
Start: 2016-09-19 — End: 2016-09-22
  Administered 2016-09-19: 3 [IU] via SUBCUTANEOUS
  Administered 2016-09-19 – 2016-09-22 (×5): 2 [IU] via SUBCUTANEOUS

## 2016-09-18 MED ORDER — ROSUVASTATIN CALCIUM 10 MG PO TABS
20.0000 mg | ORAL_TABLET | Freq: Every evening | ORAL | Status: DC
  Administered 2016-09-18 – 2016-09-21 (×4): 20 mg via ORAL
  Filled 2016-09-18 (×2): qty 2
  Filled 2016-09-18: qty 1
  Filled 2016-09-18: qty 2

## 2016-09-18 MED ORDER — MAGNESIUM SULFATE 2 GM/50ML IV SOLN
2.0000 g | Freq: Every day | INTRAVENOUS | Status: DC | PRN
  Filled 2016-09-18: qty 50

## 2016-09-18 MED ORDER — HEMOSTATIC AGENTS (NO CHARGE) OPTIME
TOPICAL | Status: DC | PRN
  Administered 2016-09-18 (×2): 1 via TOPICAL

## 2016-09-18 MED ORDER — PROMETHAZINE HCL 25 MG/ML IJ SOLN: 6.2500 mg | INTRAMUSCULAR | Status: DC | PRN

## 2016-09-18 MED ORDER — PROPOFOL 10 MG/ML IV BOLUS
INTRAVENOUS | Status: AC
  Filled 2016-09-18: qty 20

## 2016-09-18 MED ORDER — CILOSTAZOL 100 MG PO TABS
100.0000 mg | ORAL_TABLET | Freq: Two times a day (BID) | ORAL | Status: DC
  Administered 2016-09-18 – 2016-09-19 (×2): 100 mg via ORAL
  Filled 2016-09-18 (×2): qty 1

## 2016-09-18 MED ORDER — POTASSIUM CHLORIDE CRYS ER 20 MEQ PO TBCR: 20.0000 meq | EXTENDED_RELEASE_TABLET | Freq: Every day | ORAL | Status: DC | PRN

## 2016-09-18 MED ORDER — HYDROCODONE-ACETAMINOPHEN 5-325 MG PO TABS
ORAL_TABLET | ORAL | Status: AC
  Administered 2016-09-18: 1 via ORAL
  Filled 2016-09-18: qty 1

## 2016-09-18 MED ORDER — SODIUM CHLORIDE 0.9 % IV SOLN
INTRAVENOUS | Status: DC | PRN
  Administered 2016-09-18: 14:00:00

## 2016-09-18 MED ORDER — IOPAMIDOL (ISOVUE-300) INJECTION 61%
INTRAVENOUS | Status: AC
  Filled 2016-09-18: qty 50

## 2016-09-18 MED ORDER — PHENOL 1.4 % MT LIQD: 1.0000 | OROMUCOSAL | Status: DC | PRN

## 2016-09-18 MED ORDER — LIDOCAINE 2% (20 MG/ML) 5 ML SYRINGE
INTRAMUSCULAR | Status: DC | PRN
  Administered 2016-09-18: 60 mg via INTRAVENOUS

## 2016-09-18 MED ORDER — LACTATED RINGERS IV SOLN
INTRAVENOUS | Status: DC
  Administered 2016-09-18: 12:00:00 via INTRAVENOUS

## 2016-09-18 MED ORDER — METOPROLOL SUCCINATE ER 25 MG PO TB24
25.0000 mg | ORAL_TABLET | Freq: Two times a day (BID) | ORAL | Status: DC
  Administered 2016-09-18 – 2016-09-22 (×8): 25 mg via ORAL
  Filled 2016-09-18 (×8): qty 1

## 2016-09-18 MED ORDER — ONDANSETRON HCL 4 MG/2ML IJ SOLN
INTRAMUSCULAR | Status: DC | PRN
  Administered 2016-09-18: 4 mg via INTRAVENOUS

## 2016-09-18 MED ORDER — PHENYLEPHRINE HCL 10 MG/ML IJ SOLN
INTRAMUSCULAR | Status: DC | PRN
  Administered 2016-09-18: 30 ug/min via INTRAVENOUS

## 2016-09-18 MED ORDER — PHENYLEPHRINE 40 MCG/ML (10ML) SYRINGE FOR IV PUSH (FOR BLOOD PRESSURE SUPPORT)
PREFILLED_SYRINGE | INTRAVENOUS | Status: AC
  Filled 2016-09-18: qty 10

## 2016-09-18 MED ORDER — LISINOPRIL 5 MG PO TABS
5.0000 mg | ORAL_TABLET | Freq: Two times a day (BID) | ORAL | Status: DC
  Administered 2016-09-18 – 2016-09-22 (×8): 5 mg via ORAL
  Filled 2016-09-18 (×8): qty 1

## 2016-09-18 MED ORDER — SUGAMMADEX SODIUM 200 MG/2ML IV SOLN
INTRAVENOUS | Status: AC
  Filled 2016-09-18: qty 2

## 2016-09-18 MED ORDER — LACTATED RINGERS IV SOLN
INTRAVENOUS | Status: DC | PRN
  Administered 2016-09-18 (×3): via INTRAVENOUS

## 2016-09-18 MED ORDER — DEXTROSE 5 % IV SOLN
1.5000 g | Freq: Two times a day (BID) | INTRAVENOUS | Status: AC
  Administered 2016-09-19 (×2): 1.5 g via INTRAVENOUS
  Filled 2016-09-18 (×2): qty 1.5

## 2016-09-18 MED ORDER — DOCUSATE SODIUM 100 MG PO CAPS
100.0000 mg | ORAL_CAPSULE | Freq: Every day | ORAL | Status: DC
  Administered 2016-09-19 – 2016-09-22 (×3): 100 mg via ORAL
  Filled 2016-09-18 (×4): qty 1

## 2016-09-18 MED ORDER — LIDOCAINE 2% (20 MG/ML) 5 ML SYRINGE
INTRAMUSCULAR | Status: AC
  Filled 2016-09-18: qty 5

## 2016-09-18 MED ORDER — PHENYLEPHRINE 40 MCG/ML (10ML) SYRINGE FOR IV PUSH (FOR BLOOD PRESSURE SUPPORT)
PREFILLED_SYRINGE | INTRAVENOUS | Status: DC | PRN
  Administered 2016-09-18 (×4): 80 ug via INTRAVENOUS

## 2016-09-18 MED ORDER — EPHEDRINE SULFATE 50 MG/ML IJ SOLN
INTRAMUSCULAR | Status: DC | PRN
  Administered 2016-09-18: 10 mg via INTRAVENOUS

## 2016-09-18 MED ORDER — ROCURONIUM BROMIDE 10 MG/ML (PF) SYRINGE
PREFILLED_SYRINGE | INTRAVENOUS | Status: DC | PRN
  Administered 2016-09-18: 50 mg via INTRAVENOUS
  Administered 2016-09-18: 20 mg via INTRAVENOUS

## 2016-09-18 MED ORDER — HEPARIN SODIUM (PORCINE) 5000 UNIT/ML IJ SOLN
5000.0000 [IU] | Freq: Three times a day (TID) | INTRAMUSCULAR | Status: DC
  Administered 2016-09-19 – 2016-09-21 (×6): 5000 [IU] via SUBCUTANEOUS
  Filled 2016-09-18 (×6): qty 1

## 2016-09-18 MED ORDER — ROCURONIUM BROMIDE 50 MG/5ML IV SOSY
PREFILLED_SYRINGE | INTRAVENOUS | Status: AC
  Filled 2016-09-18: qty 5

## 2016-09-18 MED ORDER — DEXTROSE 5 % IV SOLN
1.5000 g | INTRAVENOUS | Status: AC
  Administered 2016-09-18: 1.5 g via INTRAVENOUS
  Filled 2016-09-18: qty 1.5

## 2016-09-18 MED ORDER — ONDANSETRON HCL 4 MG/2ML IJ SOLN
4.0000 mg | Freq: Four times a day (QID) | INTRAMUSCULAR | Status: DC | PRN
  Administered 2016-09-18: 4 mg via INTRAVENOUS
  Filled 2016-09-18: qty 2

## 2016-09-18 MED ORDER — PROTAMINE SULFATE 10 MG/ML IV SOLN
INTRAVENOUS | Status: DC | PRN
  Administered 2016-09-18: 50 mg via INTRAVENOUS

## 2016-09-18 SURGICAL SUPPLY — 64 items
BANDAGE ESMARK 6X9 LF (GAUZE/BANDAGES/DRESSINGS) ×1 IMPLANT
BNDG ESMARK 6X9 LF (GAUZE/BANDAGES/DRESSINGS) ×3
CANISTER SUCT 3000ML PPV (MISCELLANEOUS) ×3 IMPLANT
CANNULA VESSEL 3MM 2 BLNT TIP (CANNULA) ×3 IMPLANT
CLIP TI MEDIUM 24 (CLIP) ×3 IMPLANT
CLIP TI WIDE RED SMALL 24 (CLIP) ×3 IMPLANT
COVER PROBE W GEL 5X96 (DRAPES) ×3 IMPLANT
CUFF TOURNIQUET SINGLE 24IN (TOURNIQUET CUFF) ×3 IMPLANT
CUFF TOURNIQUET SINGLE 34IN LL (TOURNIQUET CUFF) IMPLANT
CUFF TOURNIQUET SINGLE 44IN (TOURNIQUET CUFF) IMPLANT
DERMABOND ADVANCED (GAUZE/BANDAGES/DRESSINGS) ×6
DERMABOND ADVANCED .7 DNX12 (GAUZE/BANDAGES/DRESSINGS) ×3 IMPLANT
DRAIN CHANNEL 15F RND FF W/TCR (WOUND CARE) IMPLANT
DRAPE PROXIMA HALF (DRAPES) IMPLANT
DRAPE X-RAY CASS 24X20 (DRAPES) IMPLANT
ELECT REM PT RETURN 9FT ADLT (ELECTROSURGICAL) ×3
ELECTRODE REM PT RTRN 9FT ADLT (ELECTROSURGICAL) ×1 IMPLANT
EVACUATOR SILICONE 100CC (DRAIN) IMPLANT
GLOVE BIO SURGEON STRL SZ 6.5 (GLOVE) ×4 IMPLANT
GLOVE BIO SURGEON STRL SZ7 (GLOVE) ×3 IMPLANT
GLOVE BIO SURGEON STRL SZ7.5 (GLOVE) ×3 IMPLANT
GLOVE BIO SURGEONS STRL SZ 6.5 (GLOVE) ×2
GLOVE BIOGEL PI IND STRL 7.0 (GLOVE) ×2 IMPLANT
GLOVE BIOGEL PI IND STRL 7.5 (GLOVE) ×2 IMPLANT
GLOVE BIOGEL PI IND STRL 8 (GLOVE) ×1 IMPLANT
GLOVE BIOGEL PI INDICATOR 7.0 (GLOVE) ×4
GLOVE BIOGEL PI INDICATOR 7.5 (GLOVE) ×4
GLOVE BIOGEL PI INDICATOR 8 (GLOVE) ×2
GLOVE SURG SS PI 7.5 STRL IVOR (GLOVE) ×6 IMPLANT
GOWN STRL REUS W/ TWL LRG LVL3 (GOWN DISPOSABLE) ×3 IMPLANT
GOWN STRL REUS W/ TWL XL LVL3 (GOWN DISPOSABLE) ×1 IMPLANT
GOWN STRL REUS W/TWL LRG LVL3 (GOWN DISPOSABLE) ×6
GOWN STRL REUS W/TWL XL LVL3 (GOWN DISPOSABLE) ×2
HEMOSTAT SNOW SURGICEL 2X4 (HEMOSTASIS) IMPLANT
INSERT FOGARTY SM (MISCELLANEOUS) IMPLANT
KIT BASIN OR (CUSTOM PROCEDURE TRAY) ×3 IMPLANT
KIT ROOM TURNOVER OR (KITS) ×3 IMPLANT
MARKER GRAFT CORONARY BYPASS (MISCELLANEOUS) IMPLANT
NS IRRIG 1000ML POUR BTL (IV SOLUTION) ×6 IMPLANT
PACK PERIPHERAL VASCULAR (CUSTOM PROCEDURE TRAY) ×3 IMPLANT
PAD ARMBOARD 7.5X6 YLW CONV (MISCELLANEOUS) ×6 IMPLANT
SET COLLECT BLD 21X3/4 12 (NEEDLE) IMPLANT
SPONGE LAP 18X18 X RAY DECT (DISPOSABLE) ×3 IMPLANT
STOPCOCK 4 WAY LG BORE MALE ST (IV SETS) IMPLANT
SUT ETHILON 3 0 PS 1 (SUTURE) IMPLANT
SUT GORETEX 6.0 TT13 (SUTURE) IMPLANT
SUT GORETEX 6.0 TT9 (SUTURE) IMPLANT
SUT PROLENE 5 0 C 1 24 (SUTURE) ×15 IMPLANT
SUT PROLENE 6 0 BV (SUTURE) ×6 IMPLANT
SUT PROLENE 7 0 BV 1 (SUTURE) IMPLANT
SUT SILK 2 0 SH (SUTURE) ×3 IMPLANT
SUT SILK 3 0 (SUTURE) ×4
SUT SILK 3-0 18XBRD TIE 12 (SUTURE) ×2 IMPLANT
SUT VIC AB 2-0 CT1 27 (SUTURE) ×4
SUT VIC AB 2-0 CT1 TAPERPNT 27 (SUTURE) ×2 IMPLANT
SUT VIC AB 3-0 SH 27 (SUTURE) ×8
SUT VIC AB 3-0 SH 27X BRD (SUTURE) ×4 IMPLANT
SUT VICRYL 4-0 PS2 18IN ABS (SUTURE) ×9 IMPLANT
TRAY FOLEY W/METER SILVER 16FR (SET/KITS/TRAYS/PACK) ×3 IMPLANT
TUBE CONNECTING 20'X1/4 (TUBING) ×1
TUBE CONNECTING 20X1/4 (TUBING) ×2 IMPLANT
TUBING EXTENTION W/L.L. (IV SETS) IMPLANT
UNDERPAD 30X30 (UNDERPADS AND DIAPERS) ×3 IMPLANT
WATER STERILE IRR 1000ML POUR (IV SOLUTION) ×3 IMPLANT

## 2016-09-18 NOTE — Op Note (Signed)
Patient name: Christian Bailey MRN: [id number] DOB: Jun 07, 1941 Sex: male  09/18/2016 Pre-operative Diagnosis: right leg claudication Post-operative diagnosis:  Same Surgeon:  [name] Assistants:  C. Victorino Dike Procedure:   Right femoral above knee popliteal bypass with non-reversed ipsilateral GSV Anesthesia:  General Blood Loss:  See anesthesia record Specimens:  none  Findings:  3-5mm GSV  Indications:  The patient presented with bilateral claudication.  He has a history of stenting of his right superficial femoral artery which is occluded.  He has a flush occlusion of his left superficial femoral artery.  We have discussed proceeding with bilateral femoral-popliteal bypass grafts.  He has adequate saphenous vein.  On the day of the procedure, he felt that his right leg was more symptomatic and therefore we elected to proceed with his right side.  This was discussed with the patient and his wife at the bedside.  Procedure:  The patient was identified in the holding area and taken to [facility]  The patient was then placed supine on the table. general anesthesia was administered.  The patient was prepped and draped in the usual sterile fashion.  A time out was called and antibiotics were administered.  I evaluated the saphenous vein with ultrasound and it appeared adequate.  I made longitudinal incision in the right groin.  Cautery was utilized appears tissue down the femoral sheath.  There is mild scar tissue.  I isolated the common femoral, superficial femoral, and profunda femoral artery.  The anterior surface of the common femoral artery appeared to be soft.  Next, I identified the saphenous vein within this incision.  It was circumferentially dissected free.  I mobilized it up to the saphenofemoral junction, dividing side branches.  I then made a medial incision above the knee.  Cautery was used to dissect a cyst out of the muscle which was open.  I entered the popliteal space  and identified the popliteal artery.  This was a soft appearing relatively disease-free artery.  This was fully exposed.  Then from within that incision I identified the saphenous vein.  I then proceeded to harvest the saphenous vein, ligating side branches between silk ties.  I made one additional incision between the groin incision and the above-knee incision to complete the harvesting of the saphenous vein.  Once it was fully mobilized it was ligated distally.  I used a collegiate clamp the saphenofemoral junction and divided the vein at this level.  A close the saphenofemoral junction with 5-0 Prolene in 2 layers.  The vein was then prepared on the back table.  It distended nicely.  It was marked for orientation.  I then created a subsartorial tunnel.  The patient was fully heparinized.  After the heparin circulated the common femoral profunda femoral and superficial femoral artery were occluded.  A #11 blade was used to make an arteriotomy on the anterior surface of the common femoral artery and this went from the femoral bifurcation proximally for about 1.5 cm.  The vein was placed in a non-reversed fashion.  I spatulated the end to fit the size of the arteriotomy.  A running anastomosis was created with 5-0 Prolene.  Once the anastomosis was completed, I used a Mills valvulotome to lyse the valves.  Once this was done several times, there was excellent pulsatile flow through the graft.  The graft was then attached to the tunneler and brought through the previously created tunnel.  A web roll was placed on the  thigh followed by tourniquet.  The leg was exsanguinated with an Esmarch.  The tourniquet was taken to 250 mm of pressure.  The leg was then fully extended and the vein cut to the appropriate length.  I made an arteriotomy in the above-knee popliteal artery with an 11 blade and extended this longitudinally with Potts scissors.  The vein was spatulated to fit the size of the arteriotomy and a running  anastomosis was created in a end-to-side fashion with 6-0 Prolene.  Prior to completion, the tourniquet was let down.  The appropriate flushing maneuvers were performed.  The graft was also flushed and the anastomosis was secured.  I then evaluated Doppler signals at the ankle.  The patient had a brisk dorsalis pedis and posterior tibial signal which were graft dependent.  The patient's heparin was reversed with 50 mg of protamine.  The wounds were copiously irrigated.  Once I was satisfied with hemostasis, the fascia was reapproximated in the above-knee incision with 2-0 Vicryl followed by 3-0 Vicryl on the subcutaneous tissue and the skin.  The middle vein harvest site was closed with 2 layers of 3-0 Vicryl.  The groin incision was closed by reapproximating the femoral sheath with 2-0 Vicryl.  The subcutaneous tissue was closed with 2 layers of 3-0 Vicryl followed by 4-0 Vicryl the skin.  Dermabond was placed on all incisions.  Patient was successfully extubated and taken to recovery in stable condition.  There were no immediate complications.   Disposition:  To PACU stable   V. [name], M.D. [facility] [place] Office: [phone] Pager:  [phone]

## 2016-09-18 NOTE — Transfer of Care (Signed)
Immediate Anesthesia Transfer of Care Note  Patient: Draydon Clairmont  Procedure(s) Performed: Procedure(s): BYPASS GRAFT FEMORAL-POPLITEAL ARTERY (Right)  Patient Location: PACU  Anesthesia Type:General  Level of Consciousness: awake, alert , oriented and patient cooperative  Airway & Oxygen Therapy: Patient Spontanous Breathing and Patient connected to face mask oxygen  Post-op Assessment: Report given to RN and Post -op Vital signs reviewed and stable  Post vital signs: Reviewed and stable  Last Vitals:  Vitals:   09/18/16 1125 09/18/16 1752  BP: (!) 143/60 (!) (P) 155/83  Pulse: 88   Resp: 20   Temp: 36.5 C (P) 36.8 C    Last Pain:  Vitals:   09/18/16 1158  PainSc: 1          Complications: No apparent anesthesia complications

## 2016-09-18 NOTE — H&P (Signed)
   Patient name: Christian Bailey MRN: [id number] DOB: 05/14/1941 Sex: male  REASON FOR VISIT:     pre-op  HISTORY OF PRESENT ILLNESS:   Christian Bailey is a 76 y.o. male presents for leg bypass.  Today he tells me that his right leg is the worst and wants that one fixed.  He denies ulcers or rest pain  CURRENT MEDICATIONS:    Current Facility-Administered Medications  Medication Dose Route Frequency Provider Last Rate Last Dose  . 0.9 %  sodium chloride infusion   Intravenous Continuous Serafina Mitchell, MD      . cefUROXime (ZINACEF) 1.5 g in dextrose 5 % 50 mL IVPB  1.5 g Intravenous 30 min Pre-Op Serafina Mitchell, MD      . Chlorhexidine Gluconate Cloth 2 % PADS 6 each  6 each Topical Once Serafina Mitchell, MD       And  . Chlorhexidine Gluconate Cloth 2 % PADS 6 each  6 each Topical Once Serafina Mitchell, MD      . lactated ringers infusion   Intravenous Continuous Myrtie Soman, MD 10 mL/hr at 09/18/16 1214      REVIEW OF SYSTEMS:   [X]  denotes positive finding, [ ]  denotes negative finding Cardiac  Comments:  Chest pain or chest pressure:    Shortness of breath upon exertion:    Short of breath when lying flat:    Irregular heart rhythm:    Constitutional    Fever or chills:      PHYSICAL EXAM:   Vitals:   09/18/16 1125  BP: (!) 143/60  Pulse: 88  Resp: 20  Temp: 97.7 F (36.5 C)  SpO2: 98%    GENERAL: The patient is a well-nourished male, in no acute distress. The vital signs are documented above. CARDIOVASCULAR: There is a regular rate and rhythm. PULMONARY: Non-labored respirations   STUDIES:   none   MEDICAL ISSUES:   Discussed with patient and wife that we would do the right leg at the patient's request.  Risks and benefits discussed  [name], MD [facility] [phone] Pager [phone]

## 2016-09-18 NOTE — Anesthesia Preprocedure Evaluation (Signed)
Anesthesia Evaluation  Patient identified by MRN, date of birth, ID band Patient awake    Reviewed: Allergy & Precautions, NPO status , Patient's Chart, lab work & pertinent test results  Airway Mallampati: II  TM Distance: >3 FB Neck ROM: Full    Dental no notable dental hx.    Pulmonary neg pulmonary ROS, former smoker,    Pulmonary exam normal breath sounds clear to auscultation       Cardiovascular hypertension, + Peripheral Vascular Disease  Normal cardiovascular exam Rhythm:Regular Rate:Normal     Neuro/Psych negative neurological ROS  negative psych ROS   GI/Hepatic Neg liver ROS, GERD  ,  Endo/Other  diabetes  Renal/GU negative Renal ROS  negative genitourinary   Musculoskeletal negative musculoskeletal ROS (+)   Abdominal   Peds negative pediatric ROS (+)  Hematology negative hematology ROS (+)   Anesthesia Other Findings   Reproductive/Obstetrics negative OB ROS                             Anesthesia Physical Anesthesia Plan  ASA: III  Anesthesia Plan: General   Post-op Pain Management:    Induction: Intravenous  Airway Management Planned: Oral ETT  Additional Equipment:   Intra-op Plan:   Post-operative Plan: Extubation in OR  Informed Consent: I have reviewed the patients History and Physical, chart, labs and discussed the procedure including the risks, benefits and alternatives for the proposed anesthesia with the patient or authorized representative who has indicated his/her understanding and acceptance.   Dental advisory given  Plan Discussed with: CRNA and Surgeon  Anesthesia Plan Comments:         Anesthesia Quick Evaluation

## 2016-09-18 NOTE — Anesthesia Procedure Notes (Signed)
Procedure Name: Intubation Date/Time: 09/18/2016 2:21 PM Performed by: Garrison Columbus T Pre-anesthesia Checklist: Patient identified, Emergency Drugs available, Suction available and Patient being monitored Patient Re-evaluated:Patient Re-evaluated prior to inductionOxygen Delivery Method: Circle System Utilized Preoxygenation: Pre-oxygenation with 100% oxygen Intubation Type: IV induction Ventilation: Mask ventilation without difficulty and Oral airway inserted - appropriate to patient size Laryngoscope Size: Sabra Heck and 2 Grade View: Grade III Tube type: Oral Tube size: 7.5 mm Number of attempts: 1 Airway Equipment and Method: Stylet and Oral airway Placement Confirmation: ETT inserted through vocal cords under direct vision,  positive ETCO2 and breath sounds checked- equal and bilateral Secured at: 23 cm Tube secured with: Tape Dental Injury: Teeth and Oropharynx as per pre-operative assessment

## 2016-09-18 NOTE — Progress Notes (Signed)
PHARMACIST - PHYSICIAN ORDER COMMUNICATION  CONCERNING: P&T Medication Policy on Herbal Medications  DESCRIPTION:  This patient's order for:  Glucosamine-chondroitin  has been noted.  This product(s) is classified as an "herbal" or natural product. Due to a lack of definitive safety studies or FDA approval, nonstandard manufacturing practices, plus the potential risk of unknown drug-drug interactions while on inpatient medications, the Pharmacy and Therapeutics Committee does not permit the use of "herbal" or natural products of this type within [facility].   ACTION TAKEN: The pharmacy department is unable to verify this order at this time and your patient has been informed of this safety policy. Please reevaluate patient's clinical condition at discharge and address if the herbal or natural product(s) should be resumed at that time.   Gracy Bruins, PharmD Clinical Pharmacist [facility]

## 2016-09-19 ENCOUNTER — Inpatient Hospital Stay (HOSPITAL_COMMUNITY): Payer: Medicare Other

## 2016-09-19 DIAGNOSIS — I70213 Atherosclerosis of native arteries of extremities with intermittent claudication, bilateral legs: Secondary | ICD-10-CM

## 2016-09-19 DIAGNOSIS — Z95828 Presence of other vascular implants and grafts: Secondary | ICD-10-CM

## 2016-09-19 LAB — CBC
HCT: 29.9 % — ABNORMAL LOW (ref 39.0–52.0)
HEMOGLOBIN: 9.7 g/dL — AB (ref 13.0–17.0)
MCH: 29.1 pg (ref 26.0–34.0)
MCHC: 32.4 g/dL (ref 30.0–36.0)
MCV: 89.8 fL (ref 78.0–100.0)
Platelets: 240 10*3/uL (ref 150–400)
RBC: 3.33 MIL/uL — ABNORMAL LOW (ref 4.22–5.81)
RDW: 12.9 % (ref 11.5–15.5)
WBC: 12 10*3/uL — AB (ref 4.0–10.5)

## 2016-09-19 LAB — GLUCOSE, CAPILLARY
GLUCOSE-CAPILLARY: 132 mg/dL — AB (ref 65–99)
Glucose-Capillary: 184 mg/dL — ABNORMAL HIGH (ref 65–99)
Glucose-Capillary: 147 mg/dL — ABNORMAL HIGH (ref 65–99)
Glucose-Capillary: 142 mg/dL — ABNORMAL HIGH (ref 65–99)

## 2016-09-19 LAB — BASIC METABOLIC PANEL
Anion gap: 12 (ref 5–15)
BUN: 22 mg/dL — ABNORMAL HIGH (ref 6–20)
CALCIUM: 8.3 mg/dL — AB (ref 8.9–10.3)
CHLORIDE: 102 mmol/L (ref 101–111)
CO2: 24 mmol/L (ref 22–32)
CREATININE: 1.15 mg/dL (ref 0.61–1.24)
GFR calc non Af Amer: >60 mL/min (ref >60)
Glucose, Bld: 166 mg/dL — ABNORMAL HIGH (ref 65–99)
Potassium: 4.8 mmol/L (ref 3.5–5.1)
SODIUM: 138 mmol/L (ref 135–145)

## 2016-09-19 MED ORDER — METFORMIN HCL 500 MG PO TABS
1000.0000 mg | ORAL_TABLET | Freq: Two times a day (BID) | ORAL | Status: DC
  Administered 2016-09-19 – 2016-09-22 (×5): 1000 mg via ORAL
  Filled 2016-09-19 (×6): qty 2

## 2016-09-19 NOTE — Progress Notes (Signed)
VASCULAR LAB PRELIMINARY  ARTERIAL  ABI completed: Right ABI of 0.55 and left ABI of 0.58 are suggestive of moderate arterial occlusive disease at rest. Previous ABI obtained on 12/09/15 showed 0.68 on the right and 0.74 on the left.   RIGHT    LEFT    PRESSURE WAVEFORM  PRESSURE WAVEFORM  BRACHIAL 120 Triphasic BRACHIAL 113 Triphasic  DP 66 Monophasic DP 67 Monophasic  PT 64 Monophasic PT 70 Monophasic    RIGHT LEFT  ABI 0.55 0.58     Legrand Como, RVT 09/19/2016, 10:26 AM

## 2016-09-19 NOTE — Progress Notes (Addendum)
[facility] [place]  Subjective  - Doing very well.  He is sitting up in a bedside chair, very minimal pain is reported.     Objective 107/61 92 98.3 F (36.8 C) (Oral) 17 98%  Intake/Output Summary (Last 24 hours) at 09/19/16 0804 Last data filed at 09/19/16 0600  Gross per 24 hour  Intake             2750 ml  Output             1250 ml  Net             1500 ml    Palpable DP on the right, incisions soft without groin hematoma. Doppler Right DP/PT/Peroneal biphasic, left DP/PT peroneal monophasic Heart RRR Lungs non labored breathin  Assessment/Planning: POD # 1 Right femoral above knee popliteal bypass with non-reversed ipsilateral GSV  Cr normal range 1.15 will restart Metformin HGB drop 20.3 now 9.7 asymptomatic Foley D/C'd this am no independent voiding yet  Stable for transfer to 2W  Laurence Slate [facility] 09/19/2016 8:04 AM --  Laboratory Lab Results:  Recent Labs  09/18/16 2035 09/19/16 0257  WBC 16.1* 12.0*  HGB 10.3* 9.7*  HCT 31.7* 29.9*  PLT 237 240   BMET  Recent Labs  09/18/16 2035 09/19/16 0257  NA  --  138  K  --  4.8  CL  --  102  CO2  --  24  GLUCOSE  --  166*  BUN  --  22*  CREATININE 1.25* 1.15  CALCIUM  --  8.3*    COAG Lab Results  Component Value Date   INR 0.99 09/11/2016   INR 1.0 04/10/2016   INR 1.01 05/08/2014   No results found for: PTT   I have independently interviewed patient and agree with PA assessment and plan above. ABI not improved although signals are strong, will check duplex of graft today. Continue plavix.   Erla Bacchi C. Donzetta Matters, MD [facility] [place] Office: [phone] Pager: [phone]

## 2016-09-19 NOTE — Progress Notes (Signed)
VASCULAR LAB PRELIMINARY  PRELIMINARY  PRELIMINARY  PRELIMINARY  Right duplex scan of bypass graft completed.    Preliminary report:  The right femoral to above knee popliteal bypass graft appears patent.  Cordai Rodrigue, RVT 09/19/2016, 6:34 PM

## 2016-09-19 NOTE — Anesthesia Postprocedure Evaluation (Signed)
Anesthesia Post Note  Patient: Yasseen Salls  Procedure(s) Performed: Procedure(s) (LRB): BYPASS GRAFT FEMORAL-POPLITEAL ARTERY (Right)  Patient location during evaluation: PACU Anesthesia Type: General Level of consciousness: awake and alert Pain management: pain level controlled Vital Signs Assessment: post-procedure vital signs reviewed and stable Respiratory status: spontaneous breathing, nonlabored ventilation, respiratory function stable and patient connected to nasal cannula oxygen Cardiovascular status: blood pressure returned to baseline and stable Postop Assessment: no signs of nausea or vomiting Anesthetic complications: no       Last Vitals:  Vitals:   09/19/16 0339 09/19/16 0700  BP: 107/61 105/65  Pulse: 92 91  Resp: 17 16  Temp: 36.8 C 36.9 C    Last Pain:  Vitals:   09/19/16 0730  TempSrc:   PainSc: 2                  Becker Christopher S

## 2016-09-19 NOTE — Evaluation (Signed)
Physical Therapy Evaluation Patient Details Name: Christian Bailey MRN: [id number] DOB: Jun 05, 1941 Today's Date: 09/19/2016   History of Present Illness  Pt is a 76 y/o male s/p R femoral above knee popliteal bypass. PMH including but not limited to  Clinical Impression  Pt presented supine in bed with HOB elevated, awake and willing to participate in therapy session. Prior to admission, pt reported that he was independent with all functional mobility and ADLs. Pt moving well during evaluation. PT will continue to follow acutely to ensure safe d/c home. Plan for stair training next session.     Follow Up Recommendations No PT follow up    Equipment Recommendations  Rolling walker with 5" wheels    Recommendations for Other Services       Precautions / Restrictions Restrictions Weight Bearing Restrictions: No      Mobility  Bed Mobility Overal bed mobility: Needs Assistance Bed Mobility: Supine to Sit;Sit to Supine     Supine to sit: Supervision Sit to supine: Supervision   General bed mobility comments: increased time, use of bed rails, supervision for safety  Transfers Overall transfer level: Needs assistance Equipment used: Rolling walker (2 wheeled) Transfers: Sit to/from Stand Sit to Stand: Min guard         General transfer comment: increased time, VC'ing for technique, min guard for safety  Ambulation/Gait Ambulation/Gait assistance: Min guard Ambulation Distance (Feet): 75 Feet Assistive device: Rolling walker (2 wheeled) Gait Pattern/deviations: Step-through pattern;Step-to pattern;Decreased step length - left;Decreased stance time - right;Decreased stride length;Decreased weight shift to right Gait velocity: decreased Gait velocity interpretation: Below normal speed for age/gender General Gait Details: pt with mild instability with ambulation but no LOB or need for physical assistance.   Stairs            Wheelchair Mobility    Modified Rankin  (Stroke Patients Only)       Balance Overall balance assessment: Needs assistance Sitting-balance support: Feet supported Sitting balance-Leahy Scale: Good     Standing balance support: During functional activity;No upper extremity supported Standing balance-Leahy Scale: Fair                               Pertinent Vitals/Pain Pain Assessment: 0-10 Pain Score: 2  Pain Location: R thigh Pain Descriptors / Indicators: Sore Pain Intervention(s): Monitored during session;Repositioned    Home Living Family/patient expects to be discharged to:: Private residence Living Arrangements: Spouse/significant other Available Help at Discharge: Family;Available 24 hours/day Type of Home: House Home Access: Stairs to enter Entrance Stairs-Rails: Left Entrance Stairs-Number of Steps: 4 Home Layout: One level Home Equipment: None      Prior Function Level of Independence: Independent               Hand Dominance        Extremity/Trunk Assessment   Upper Extremity Assessment Upper Extremity Assessment: Overall WFL for tasks assessed    Lower Extremity Assessment Lower Extremity Assessment: RLE deficits/detail RLE Deficits / Details: pt with decreased strength secondary to post-op and painful. Pt able to lift LE against gravity during functional mobility. Sensation to light touch grossly intact.    Cervical / Trunk Assessment Cervical / Trunk Assessment: Normal  Communication   Communication: No difficulties  Cognition Arousal/Alertness: Awake/alert Behavior During Therapy: WFL for tasks assessed/performed Overall Cognitive Status: Within Functional Limits for tasks assessed  General Comments      Exercises     Assessment/Plan    PT Assessment Patient needs continued PT services  PT Problem List Decreased strength;Decreased activity tolerance;Decreased balance;Decreased mobility;Decreased  coordination;Decreased knowledge of use of DME;Decreased safety awareness;Pain       PT Treatment Interventions DME instruction;Gait training;Stair training;Functional mobility training;Therapeutic activities;Therapeutic exercise;Balance training;Neuromuscular re-education;Patient/family education    PT Goals (Current goals can be found in the Care Plan section)  Acute Rehab PT Goals Patient Stated Goal: return home PT Goal Formulation: With patient Time For Goal Achievement: 10/03/16 Potential to Achieve Goals: Good    Frequency Min 3X/week   Barriers to discharge        Co-evaluation               End of Session Equipment Utilized During Treatment: Gait belt Activity Tolerance: Patient tolerated treatment well Patient left: in bed;with call bell/phone within reach;with bed alarm set Nurse Communication: Mobility status PT Visit Diagnosis: Other abnormalities of gait and mobility (R26.89);Pain Pain - Right/Left: Right Pain - part of body: Leg    Time: 1525-1540 PT Time Calculation (min) (ACUTE ONLY): 15 min   Charges:   PT Evaluation $PT Eval Moderate Complexity: 1 Procedure     PT G Codes:        Sherie Don, PT, DPT [place] 09/19/2016, 3:53 PM

## 2016-09-19 NOTE — Progress Notes (Signed)
Patient to transfer to 2W36 report given to receiving nurse, all questions answered at this time.  Pt. VSS with no s/s of distress noted.  Patient stable at transfer.

## 2016-09-20 ENCOUNTER — Encounter (HOSPITAL_COMMUNITY): Payer: Self-pay | Admitting: Surgery

## 2016-09-20 LAB — GLUCOSE, CAPILLARY
GLUCOSE-CAPILLARY: 134 mg/dL — AB (ref 65–99)
Glucose-Capillary: 136 mg/dL — ABNORMAL HIGH (ref 65–99)
Glucose-Capillary: 131 mg/dL — ABNORMAL HIGH (ref 65–99)
Glucose-Capillary: 151 mg/dL — ABNORMAL HIGH (ref 65–99)

## 2016-09-20 LAB — HEMOGLOBIN A1C
HEMOGLOBIN A1C: 6.8 % — AB (ref 4.8–5.6)
MEAN PLASMA GLUCOSE: 148 mg/dL

## 2016-09-20 MED ORDER — HYDROCODONE-ACETAMINOPHEN 5-325 MG PO TABS
1.0000 | ORAL_TABLET | Freq: Four times a day (QID) | ORAL | 0 refills | Status: DC | PRN
Start: 2016-09-20 — End: 2016-09-22

## 2016-09-20 NOTE — Progress Notes (Addendum)
[facility] [place]  Subjective  - Doing well.  Ambulated to bathroom, pain is minimal.  Christian Bailey wants to go home if possible.   Objective (!) 94/54 (!) 124 98.4 F (36.9 C) (Oral) 18 94%  Intake/Output Summary (Last 24 hours) at 09/20/16 0859 Last data filed at 09/20/16 0836  Gross per 24 hour  Intake              840 ml  Output             1375 ml  Net             -535 ml   VASCULAR LAB PRELIMINARY  PRELIMINARY  PRELIMINARY  PRELIMINARY  Right duplex scan of bypass graft completed.    Preliminary report:  The right femoral to above knee popliteal bypass graft appears patent.  Doppler signal AT/PT right LE Incisions healing well, groin soft without hematoma Heart S tachy baseline Lungs non labored breathing  Assessment/Planning: POD # 2 Right femoral above knee popliteal bypass with non-reversed ipsilateral GSV  Ambulating , voided, pain controlled.  By pass is patent.  [name] reported to me today that the pateint has Stenosis above the by pass and wants to keep him.  Christian Bailey wants Dr. Donzetta Matters to review the duplex studies and whether or not Christian Bailey needs an angiogram tomorrow.   Christian Bailey [facility] 09/20/2016 8:59 AM --  Laboratory Lab Results:  Recent Labs  09/18/16 2035 09/19/16 0257  WBC 16.1* 12.0*  HGB 10.3* 9.7*  HCT 31.7* 29.9*  PLT 237 240   BMET  Recent Labs  09/18/16 2035 09/19/16 0257  NA  --  138  K  --  4.8  CL  --  102  CO2  --  24  GLUCOSE  --  166*  BUN  --  22*  CREATININE 1.25* 1.15  CALCIUM  --  8.3*    COAG Lab Results  Component Value Date   INR 0.99 09/11/2016   INR 1.0 04/10/2016   INR 1.01 05/08/2014   No results found for: PTT   I have independently interviewed Christian Bailey and agree with PA assessment and plan above. ABI not significantly improved and bypass appears patent although study is certainly limited by post operative status. Velocities distally in bypass are low but proximally appear  satisfactory. Christian Bailey is staying today and will discuss with [name] possibility of further intervention prior to dc.   Brandon C. Donzetta Matters, MD [facility] [place] Office: [phone] Pager: [phone]   Discussed with [name] and Christian Bailey. Npo at midnight for possible procedure tomorrow.   Servando Snare

## 2016-09-21 ENCOUNTER — Encounter (HOSPITAL_COMMUNITY): Payer: Self-pay | Admitting: Vascular Surgery

## 2016-09-21 ENCOUNTER — Encounter (HOSPITAL_COMMUNITY)
Admission: RE | Disposition: A | Discharge status: Home / Self Care | Payer: Self-pay | Source: Ambulatory Visit | Attending: Surgery

## 2016-09-21 DIAGNOSIS — R0989 Other specified symptoms and signs involving the circulatory and respiratory systems: Secondary | ICD-10-CM

## 2016-09-21 HISTORY — PX: LOWER EXTREMITY ANGIOGRAPHY: CATH118251

## 2016-09-21 LAB — GLUCOSE, CAPILLARY
GLUCOSE-CAPILLARY: 108 mg/dL — AB (ref 65–99)
GLUCOSE-CAPILLARY: 222 mg/dL — AB (ref 65–99)
Glucose-Capillary: 118 mg/dL — ABNORMAL HIGH (ref 65–99)
Glucose-Capillary: 89 mg/dL (ref 65–99)
Glucose-Capillary: 120 mg/dL — ABNORMAL HIGH (ref 65–99)

## 2016-09-21 LAB — CBC
HCT: 30.7 % — ABNORMAL LOW (ref 39.0–52.0)
Hemoglobin: 9.9 g/dL — ABNORMAL LOW (ref 13.0–17.0)
MCH: 29.2 pg (ref 26.0–34.0)
MCHC: 32.2 g/dL (ref 30.0–36.0)
MCV: 90.6 fL (ref 78.0–100.0)
PLATELETS: 242 10*3/uL (ref 150–400)
RBC: 3.39 MIL/uL — AB (ref 4.22–5.81)
RDW: 13.2 % (ref 11.5–15.5)
WBC: 9 10*3/uL (ref 4.0–10.5)

## 2016-09-21 LAB — CREATININE, SERUM
CREATININE: 1.34 mg/dL — AB (ref 0.61–1.24)
GFR calc non Af Amer: 50 mL/min — ABNORMAL LOW (ref >60)
GFR, EST AFRICAN AMERICAN: 58 mL/min — AB (ref >60)

## 2016-09-21 SURGERY — LOWER EXTREMITY ANGIOGRAPHY
Anesthesia: LOCAL

## 2016-09-21 MED ORDER — MIDAZOLAM HCL 2 MG/2ML IJ SOLN
INTRAMUSCULAR | Status: AC
  Filled 2016-09-21: qty 2

## 2016-09-21 MED ORDER — SODIUM CHLORIDE 0.9 % IV SOLN
INTRAVENOUS | Status: DC | PRN
  Administered 2016-09-21: 75 mL/h via INTRAVENOUS

## 2016-09-21 MED ORDER — ONDANSETRON HCL 4 MG/2ML IJ SOLN: 4.0000 mg | Freq: Four times a day (QID) | INTRAMUSCULAR | Status: DC | PRN

## 2016-09-21 MED ORDER — IODIXANOL 320 MG/ML IV SOLN
INTRAVENOUS | Status: DC | PRN
  Administered 2016-09-21: 75 mL

## 2016-09-21 MED ORDER — MIDAZOLAM HCL 2 MG/2ML IJ SOLN
INTRAMUSCULAR | Status: DC | PRN
  Administered 2016-09-21: 1 mg via INTRAVENOUS

## 2016-09-21 MED ORDER — FENTANYL CITRATE (PF) 100 MCG/2ML IJ SOLN
INTRAMUSCULAR | Status: DC | PRN
  Administered 2016-09-21: 50 ug via INTRAVENOUS

## 2016-09-21 MED ORDER — HEPARIN (PORCINE) IN NACL 2-0.9 UNIT/ML-% IJ SOLN
INTRAMUSCULAR | Status: AC
  Filled 2016-09-21: qty 1000

## 2016-09-21 MED ORDER — ENOXAPARIN SODIUM 40 MG/0.4ML ~~LOC~~ SOLN
40.0000 mg | SUBCUTANEOUS | Status: DC
  Administered 2016-09-22: 40 mg via SUBCUTANEOUS
  Filled 2016-09-21: qty 0.4

## 2016-09-21 MED ORDER — SODIUM CHLORIDE 0.9 % IV SOLN: 1.0000 mL/kg/h | INTRAVENOUS | Status: AC

## 2016-09-21 MED ORDER — LIDOCAINE HCL (PF) 1 % IJ SOLN
INTRAMUSCULAR | Status: DC | PRN
  Administered 2016-09-21: 15 mL via SUBCUTANEOUS

## 2016-09-21 MED ORDER — FENTANYL CITRATE (PF) 100 MCG/2ML IJ SOLN
INTRAMUSCULAR | Status: AC
  Filled 2016-09-21: qty 2

## 2016-09-21 MED ORDER — LIDOCAINE HCL (PF) 1 % IJ SOLN
INTRAMUSCULAR | Status: AC
  Filled 2016-09-21: qty 30

## 2016-09-21 MED ORDER — HEPARIN (PORCINE) IN NACL 2-0.9 UNIT/ML-% IJ SOLN
INTRAMUSCULAR | Status: DC | PRN
  Administered 2016-09-21: 1000 mL

## 2016-09-21 SURGICAL SUPPLY — 10 items
CATH OMNI FLUSH 5F 65CM (CATHETERS) ×2 IMPLANT
COVER PRB 48X5XTLSCP FOLD TPE (BAG) ×1 IMPLANT
COVER PROBE 5X48 (BAG) ×1
KIT PV (KITS) ×2 IMPLANT
SHEATH PINNACLE 5F 10CM (SHEATH) ×2 IMPLANT
SYR MEDRAD MARK V 150ML (SYRINGE) ×2 IMPLANT
TRANSDUCER W/STOPCOCK (MISCELLANEOUS) ×2 IMPLANT
TRAY PV CATH (CUSTOM PROCEDURE TRAY) ×2 IMPLANT
WIRE BENTSON .035X145CM (WIRE) ×2 IMPLANT
WIRE MINI STICK MAX (SHEATH) ×2 IMPLANT

## 2016-09-21 NOTE — Progress Notes (Signed)
PT Cancellation Note  Patient Details Name: Christian Bailey MRN: [id number] DOB: February 20, 1941   Cancelled Treatment:    Reason Eval/Treat Not Completed: Patient at procedure or test/unavailable. Pt going for angiogram today. Will await results and return as able to further progress mobility.   Delmus Warwick M [place] Bohac 09/21/2016, 11:30 AM   Kittie Plater, PT, DPT Pager #: [phone] Office #: [phone]

## 2016-09-21 NOTE — Op Note (Signed)
OPERATIVE NOTE   PROCEDURE: 1.  left common femoral artery cannulation under ultrasound guidance 2.  Second order arterial selection 3.  Right leg runoff 4.  Conscious sedation for 18 minutes  PRE-OPERATIVE DIAGNOSIS: possible retained valve in bypass graft  POST-OPERATIVE DIAGNOSIS: same as above   SURGEON: Adele Barthel, MD  ANESTHESIA: conscious sedation  ESTIMATED BLOOD LOSS: 30 cc  CONTRAST: 75 cc  FINDING(S):   Right  CFA Patent  SFA Occluded native with occluded stents, CFA to AK popliteal bypass appears patent without any stenoses  PFA Patent, proximal stenosis ~50%  Pop patent  Trif patent  AT Patent, distal 50% stenosis  Pero patent  PT patent   SPECIMEN(S):  none  INDICATIONS:   Christian Bailey is a 76 y.o. male who presents with loss of pedal pulses after a left femoropopliteal bypass with greater saphenous vein.  [name] had concerns that this patient had compromise of the bypass with a retained valve.  The patient presents for: right leg angiogram, possible intervention.  I discussed with the patient the nature of angiographic procedures, especially the limited patencies of any endovascular intervention.  The patient is aware of that the risks of an angiographic procedure include but are not limited to: bleeding, infection, access site complications, renal failure, embolization, rupture of vessel, dissection, possible need for emergent surgical intervention, possible need for surgical procedures to treat the patient's pathology, and stroke and death.  The patient is aware of the risks and agrees to proceed.  DESCRIPTION: After full informed consent was obtained from the patient, the patient was brought back to the angiography suite.  The patient was placed supine upon the angiography table and connected to cardiopulmonary monitoring equipment.  The patient was then given conscious sedation, the amounts of which are documented in the patient's chart.  A  circulating radiologic technician maintained continuous monitoring of the patient's cardiopulmonary status.  Additionally, the control room radiologic technician provided backup monitoring throughout the procedure.  The patient was prepped and drape in the standard fashion for an angiographic procedure.  At this point, attention was turned to the left groin.  Under ultrasound guidance, the subcutaneous tissue surrounding the left common femoral artery was anesthesized with 1% lidocaine with epinephrine.  The artery was then cannulated with a micropuncture needle with great difficulty due to calcific anterior plaque.  The microwire was advanced into the iliac arterial system.  The needle was exchanged for a microsheath, which was loaded into the common femoral artery over the wire.  The microwire was exchanged for a Bentson wire which was advanced into the aorta.  The microsheath was then exchanged for a 5-Fr sheath which was loaded into the common femoral artery.  Using a Bentson wire and Omniflush catheter, the right common iliac artery was selected.  The catheter and wire were advanced into the external iliac artery.  The wire was removed.  The catheter was connected to the power injector circuit.  An automated right leg runoff was completed.  I also did a dedicated lateral foot injection as the flow was fast enough to beat the angiographic table.  The findings are listed above.  Based on the above, I do not believe there is anything to intervene upon.  I replaced the wire into the catheter, straightening out the crook in the catheter.  Both were removed from the sheath together.  The sheath was aspirated.  No clots were present and the sheath was reloaded with heparinized saline.  COMPLICATIONS: none  CONDITION: stable   Adele Barthel, MD, [facility] [place] Office: [phone] Pager: [phone]  09/21/2016, 2:20 PM

## 2016-09-21 NOTE — Progress Notes (Signed)
Pt had a 5Fr sheath in his Left femoral artery, sheath was pulled at this time and manual pressure was applied for 15mins. Site was dressed with gauze and a tegaderm, and was level 0 pre/post sheath pull. Pedal pulses dopplered pre/post sheath pull. Vitals remained WNL throughout the process. Post instructions given to pt. No complications noted. Pt will be returning to 2W36 shortly.

## 2016-09-21 NOTE — Interval H&P Note (Signed)
   History and Physical Update  The patient was interviewed and re-examined.  The patient's previous History and Physical has been reviewed and is unchanged from Dr. Stephens Shire H&P except for interval right CFA to AK pop BPG w/ GSV.  The patient apparently lost pedal pulses over the weekend, so [name] recommended right leg angiogram and possible intervention.   I discussed with the patient the nature of angiographic procedures, especially the limited patencies of any endovascular intervention.    The patient is aware of that the risks of an angiographic procedure include but are not limited to: bleeding, infection, access site complications, renal failure, embolization, rupture of vessel, dissection, arteriovenous fistula, possible need for emergent surgical intervention, possible need for surgical procedures to treat the patient's pathology, anaphylactic reaction to contrast, and stroke and death.    The patient is aware of the risks and agrees to proceed.   Adele Barthel, MD, FACS [facility] [place] Office: [phone] Pager: [phone]  09/21/2016, 1:22 PM

## 2016-09-21 NOTE — Progress Notes (Signed)
    Subjective  - POD #3, status post right femoral to above-knee popliteal bypass graft with ipsilateral non-reversed greater saphenous vein  The patient is complaining of right shoulder pain today.  He does not have any leg pain.   Physical Exam:  Incisions are healing.  Foot is warm.  Pedal pulses are not palpable.       Assessment/Plan:   After reviewing the patient's ABI and duplex from this weekend, I feel that he needs to proceed with angiography to determine why his postoperative ABIs did not increase.  The duplex shows that his bypass graft remains patent.  The velocities within the bypass graft are decreased distally.  This makes me think he may have a retained valve.  The patient is in agreement to proceed with angiography today by Dr. Scot Dock.  Intervention will be performed if indicated.  The risks and benefits were discussed with the patient and he is willing to proceed.  [name] 09/21/2016 8:43 AM --  Vitals:   09/20/16 2240 09/21/16 0545  BP: (!) 101/45 (!) 112/52  Pulse: (!) 113 (!) 115  Resp:  18  Temp:  98.4 F (36.9 C)    Intake/Output Summary (Last 24 hours) at 09/21/16 0843 Last data filed at 09/21/16 0231  Gross per 24 hour  Intake              480 ml  Output             1290 ml  Net             -810 ml     Laboratory CBC    Component Value Date/Time   WBC 12.0 (H) 09/19/2016 0257   HGB 9.7 (L) 09/19/2016 0257   HCT 29.9 (L) 09/19/2016 0257   PLT 240 09/19/2016 0257    BMET    Component Value Date/Time   NA 138 09/19/2016 0257   K 4.8 09/19/2016 0257   CL 102 09/19/2016 0257   CO2 24 09/19/2016 0257   GLUCOSE 166 (H) 09/19/2016 0257   BUN 22 (H) 09/19/2016 0257   CREATININE 1.15 09/19/2016 0257   CREATININE 1.33 (H) 04/10/2016 0851   CALCIUM 8.3 (L) 09/19/2016 0257   GFRNONAA >60 09/19/2016 0257   GFRAA >60 09/19/2016 0257    COAG Lab Results  Component Value Date   INR 0.99 09/11/2016   INR 1.0 04/10/2016   INR  1.01 05/08/2014   No results found for: PTT  Antibiotics Anti-infectives    Start     Dose/Rate Route Frequency Ordered Stop   09/18/16 2359  cefUROXime (ZINACEF) 1.5 g in dextrose 5 % 50 mL IVPB     1.5 g 100 mL/hr over 30 Minutes Intravenous Every 12 hours 09/18/16 1950 09/19/16 1254   09/18/16 0625  cefUROXime (ZINACEF) 1.5 g in dextrose 5 % 50 mL IVPB     1.5 g 100 mL/hr over 30 Minutes Intravenous 30 min pre-op 09/18/16 0625 09/18/16 1435       V. Leia Alf, M.D. [facility] [place] Office: [phone] Pager:  [phone]

## 2016-09-21 NOTE — Progress Notes (Signed)
OT Cancellation    09/21/16 1300  OT Visit Information  Last OT Received On 09/21/16  Reason Eval/Treat Not Completed Patient at procedure or test/ unavailable  [facility], OT/L  [phone] 09/21/2016

## 2016-09-22 ENCOUNTER — Other Ambulatory Visit: Payer: Self-pay | Admitting: Cardiovascular Disease

## 2016-09-22 LAB — BASIC METABOLIC PANEL
ANION GAP: 9 (ref 5–15)
BUN: 20 mg/dL (ref 6–20)
CHLORIDE: 102 mmol/L (ref 101–111)
CO2: 29 mmol/L (ref 22–32)
Calcium: 8.6 mg/dL — ABNORMAL LOW (ref 8.9–10.3)
Creatinine, Ser: 1.33 mg/dL — ABNORMAL HIGH (ref 0.61–1.24)
GFR calc non Af Amer: 51 mL/min — ABNORMAL LOW (ref >60)
GFR, EST AFRICAN AMERICAN: 59 mL/min — AB (ref >60)
Glucose, Bld: 111 mg/dL — ABNORMAL HIGH (ref 65–99)
Potassium: 4 mmol/L (ref 3.5–5.1)
Sodium: 140 mmol/L (ref 135–145)

## 2016-09-22 LAB — GLUCOSE, CAPILLARY
Glucose-Capillary: 115 mg/dL — ABNORMAL HIGH (ref 65–99)
Glucose-Capillary: 150 mg/dL — ABNORMAL HIGH (ref 65–99)

## 2016-09-22 LAB — CBC
HEMATOCRIT: 28.3 % — AB (ref 39.0–52.0)
HEMOGLOBIN: 9 g/dL — AB (ref 13.0–17.0)
MCH: 28.6 pg (ref 26.0–34.0)
MCHC: 31.8 g/dL (ref 30.0–36.0)
MCV: 89.8 fL (ref 78.0–100.0)
Platelets: 265 10*3/uL (ref 150–400)
RBC: 3.15 MIL/uL — AB (ref 4.22–5.81)
RDW: 13.1 % (ref 11.5–15.5)
WBC: 8.4 10*3/uL (ref 4.0–10.5)

## 2016-09-22 MED ORDER — CLOPIDOGREL BISULFATE 75 MG PO TABS: 75.0000 mg | ORAL_TABLET | Freq: Every day | ORAL | Status: DC

## 2016-09-22 MED ORDER — HYDROCODONE-ACETAMINOPHEN 5-325 MG PO TABS: 1.0000 | ORAL_TABLET | Freq: Four times a day (QID) | ORAL | 0 refills | Status: DC | PRN

## 2016-09-22 NOTE — Progress Notes (Signed)
Pt has discharged home with wife. IV and telemetry box removed. Pt received discharge instructions and all questions were answered. Pt left with all of his belongings. Pt left the unit via wheelchair and was accompanied by a Wilfrid Lund and pt's wife.   Grant Fontana BSN, RN

## 2016-09-22 NOTE — Progress Notes (Signed)
Occupational Therapy Evaluation and Discharge  Patient evaluated by Occupational Therapy with no further acute OT needs identified. All education has been completed and the patient has no further questions. All education completed.  Pt is able to perform ADLs with AE at supervision level.  See below for any follow-up Occupational Therapy or equipment needs. OT is signing off. Thank you for this referral.  No follow OT recommended. Recommend 3in1 commode   09/22/16 1236  OT Visit Information  Last OT Received On 09/22/16  Assistance Needed +1  History of Present Illness Pt is a 76 y/o male s/p R femoral above knee popliteal bypass with left common femoral artery cannulation. PMH including but not limited to HTN, sinus tachycardia, DM, hyperlipidemia, PAD   Precautions  Precautions Fall  Home Living  Family/patient expects to be discharged to: Private residence  Living Arrangements Spouse/significant other  Available Help at Discharge Family;Available 24 hours/day  Type of Home House  Home Access Stairs to enter  Entrance Stairs-Number of Steps 4  Entrance Stairs-Rails Left  Home Layout One level  Bathroom Animator seat - built in  Additional Comments has sock aid he can't use   Prior Function  Level of Independence Needs assistance  Gait / Transfers Assistance Needed independent - denies h/o falls   ADL's / Homemaking Assistance Needed Pt reports difficulty with LB ADLs due limited hip and knee ROM as well as limited shoulder ROM.  He reports son bought him a sock aid that he has been unable to use   Communication  Communication No difficulties  Cognition  Arousal/Alertness Awake/alert  Behavior During Therapy WFL for tasks assessed/performed  Overall Cognitive Status Within Functional Limits for tasks assessed  Upper Extremity Assessment  RUE Deficits / Details Pt with long standing ROM limitations bil. shoulders.   LUE Deficits / Details  Pt with long standing ROM limitations bil. shoulders.  Cervical / Trunk Assessment  Cervical / Trunk Assessment Normal  ADL  Overall ADL's  Needs assistance/impaired  Eating/Feeding Independent  Grooming Wash/dry hands;Wash/dry face;Oral care;Brushing hair;Supervision/safety;Standing  Upper Body Bathing Set up;Sitting  Lower Body Bathing Supervison/ safety;Sit to/from stand  Upper Body Dressing  Set up;Sitting  Lower Body Dressing Supervision/safety  Lower Body Dressing Details (indicate cue type and reason) Pt instructed in use of sock aid (different than he was using PTA), reacher and shoe horn.  Using AE, he was able to perform LB ADLs with supervision.     Automotive engineer;Ambulation;Grab bars  Toilet Transfer Details (indicate cue type and reason) discussed options for toilet DME - recommend 3in1  Toileting- Clothing Manipulation and Hygiene Supervision/safety;Sit to/from stand  Tub/ IT consultant shower;Supervision/safety;Ambulation;Tub bench  Functional mobility during ADLs Supervision/safety  General ADL Comments Pt instructed to avoid standing to perform LB ADLs due to risk of falls   Bed Mobility  Overal bed mobility Modified Independent  Transfers  Overall transfer level Modified independent  Balance  Sitting balance-Leahy Scale Good  Standing balance-Leahy Scale Fair  OT - End of Session  Activity Tolerance Patient tolerated treatment well  Patient left in bed;with call bell/phone within reach;with family/visitor present  OT Assessment  OT Recommendation/Assessment Patient does not need any further OT services  OT Visit Diagnosis Muscle weakness (generalized) (M62.81)  OT Problem List Decreased strength;Decreased activity tolerance;Impaired balance (sitting and/or standing);Decreased knowledge of use of DME or AE  AM-PAC OT "6 Clicks" Daily Activity Outcome Measure  Help from another person eating meals?  4  Help from another person taking care  of personal grooming? 3  Help from another person toileting, which includes using toliet, bedpan, or urinal? 3  Help from another person bathing (including washing, rinsing, drying)? 3  Help from another person to put on and taking off regular upper body clothing? 3  Help from another person to put on and taking off regular lower body clothing? 3  6 Click Score 19  ADL G Code Conversion CK  OT Recommendation  Follow Up Recommendations No OT follow up;Supervision - Intermittent  OT Equipment 3 in 1 bedside commode  Acute Rehab OT Goals  Patient Stated Goal return home  OT Goal Formulation All assessment and education complete, DC therapy  OT Time Calculation  OT Start Time (ACUTE ONLY) 1033  OT Stop Time (ACUTE ONLY) 1123  OT Time Calculation (min) 50 min  OT General Charges  $OT Visit 1 Procedure  OT Evaluation  $OT Eval Low Complexity 1 Procedure  OT Treatments  $Self Care/Home Management  23-37 mins  [facility], OTR/L [phone]

## 2016-09-22 NOTE — Discharge Summary (Signed)
Discharge Summary     Christian Bailey 1940/06/30 76 y.o. male  [id number]  Admission Date: 09/18/2016  Discharge Date: 09/22/16  Physician: Serafina Mitchell, MD  Admission Diagnosis: PAD (peripheral artery disease) ([place]) [I73.9]   HPI:   This is a 76 y.o. male presents for leg bypass.  Today he tells me that his right leg is the worst and wants that one fixed.  He denies ulcers or rest pain  Hospital Course:  The patient was admitted to the hospital and taken to the operating room on 09/18/2016 - 09/21/2016 and underwent: Right femoral above knee popliteal bypass with non-reversed ipsilateral GSV    The pt tolerated the procedure well and was transported to the PACU in good condition.   By POD 1, he was doing well.  He did have acute surgical blood loss anemia and was asymptomatic.  His creatinine was normal and his Metformin was restarted.  He was transferred to 2 [place].  His ABI's were decreased and a RLE arterial duplex was obtained.  He is continued on Plavix.   ABI's 09/19/16: ABI completed: Right ABI of 0.55 and left ABI of 0.58 are suggestive of moderate arterial occlusive disease at rest. Previous ABI obtained on 12/09/15 showed 0.68 on the right and 0.74 on the left.   RIGHT    LEFT    PRESSURE WAVEFORM  PRESSURE WAVEFORM  BRACHIAL 120 Triphasic BRACHIAL 113 Triphasic  DP 66 Monophasic DP 67 Monophasic  PT 64 Monophasic PT 70 Monophasic    RIGHT LEFT  ABI 0.55 0.58   The RLE arterial duplex revealed the graft was patent.   On POD 2, ABI's were not significantly improved and bypass appears patent although study is certainly limited by post operative status. Velocities distally in bypass are low but proximally appear satisfactory. He is staying today and will discuss with [name] possibility of further intervention prior to dc.  It was discussed with [name] and he is to undergo arteriogram.   On POD 3, After reviewing the patient's ABI and duplex  from this weekend, I feel that he needs to proceed with angiography to determine why his postoperative ABIs did not increase.  The duplex shows that his bypass graft remains patent.  The velocities within the bypass graft are decreased distally.  This makes me think he may have a retained valve.  The patient is in agreement to proceed with angiography today by Dr. Scot Dock.  Intervention will be performed if indicated.  The risks and benefits were discussed with the patient and he is willing to proceed.  He was taken to the [facility] lab by Dr. Bridgett Larsson on 09/21/16 and underwent: 1.  left common femoral artery cannulation under ultrasound guidance 2.  Second order arterial selection 3.  Right leg runoff 4.  Conscious sedation for 18 minutes  Findings as follows: FINDING(S):   Right  CFA Patent  SFA Occluded native with occluded stents, CFA to AK popliteal bypass appears patent without any stenoses  PFA Patent, proximal stenosis ~50%  Pop patent  Trif patent  AT Patent, distal 50% stenosis  Pero patent  PT patent   On POD 4, pt is doing well.  [name] discussed with pt that studies from yesterday look good and he is able to be discharged home.  Groin wound care is discussed with the pt.   The remainder of the hospital course consisted of increasing mobilization and increasing intake of solids without difficulty.  CBC  Component Value Date/Time   WBC 8.4 09/22/2016 0458   RBC 3.15 (L) 09/22/2016 0458   HGB 9.0 (L) 09/22/2016 0458   HCT 28.3 (L) 09/22/2016 0458   PLT 265 09/22/2016 0458   MCV 89.8 09/22/2016 0458   MCH 28.6 09/22/2016 0458   MCHC 31.8 09/22/2016 0458   RDW 13.1 09/22/2016 0458   LYMPHSABS 1,722 04/10/2016 0851   MONOABS 656 04/10/2016 0851   EOSABS 246 04/10/2016 0851   BASOSABS 82 04/10/2016 0851    BMET    Component Value Date/Time   NA 140 09/22/2016 0458   K 4.0 09/22/2016 0458   CL 102 09/22/2016 0458   CO2 29 09/22/2016 0458   GLUCOSE 111 (H)  09/22/2016 0458   BUN 20 09/22/2016 0458   CREATININE 1.33 (H) 09/22/2016 0458   CREATININE 1.33 (H) 04/10/2016 0851   CALCIUM 8.6 (L) 09/22/2016 0458   GFRNONAA 51 (L) 09/22/2016 0458   GFRAA 59 (L) 09/22/2016 0458     Discharge Instructions    Call MD for:  redness, tenderness, or signs of infection (pain, swelling, bleeding, redness, odor or green/yellow discharge around incision site)    Complete by:  As directed    Call MD for:  severe or increased pain, loss or decreased feeling  in affected limb(s)    Complete by:  As directed    Call MD for:  temperature >100.5    Complete by:  As directed    Discharge instructions    Complete by:  As directed    Wash the groin wound with soap and water daily and pat dry. (No tub bath-only shower)  Then put a dry gauze or washcloth there to keep this area dry daily and as needed.  Do not use Vaseline or neosporin on your incisions.  Only use soap and water on your incisions and then protect and keep dry.   Driving Restrictions    Complete by:  As directed    No driving for 1-2 weeks   Increase activity slowly    Complete by:  As directed    Walk with assistance use walker or cane as needed   Lifting restrictions    Complete by:  As directed    No heavy lifting for 4 weeks   Resume previous diet    Complete by:  As directed       Discharge Diagnosis:  PAD (peripheral artery disease) ([place]) [I73.9]  Secondary Diagnosis: Patient Active Problem List   Diagnosis Date Noted  . PVD (peripheral vascular disease) ([place]) 09/18/2016  . Sinus tachycardia 05/15/2014  . Carotid artery disease ([place]) 04/26/2014  . PAD (peripheral artery disease), hx remote stent Lt common iliac, 04/2010-stent to lt common iliac & 2 stents to the Rt SFA, 04/2014 - s/p PTA of right SFA for in-stent restenosis  06/12/2013  . 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 Claudication ([place]) 12/14/2012  . Essential hypertension  12/14/2012  . Hyperlipidemia 12/14/2012  . Type 2 diabetes mellitus ([place]) 12/14/2012  . Personal history of colonic polyps 01/25/2012  . DM 08/11/2010  . DUODENAL ULCER, ACUTE, HEMORRHAGE 08/11/2010   Past Medical History:  Diagnosis Date  . Anemia   . Diabetes mellitus ([place])    TYPE 2  . GERD (gastroesophageal reflux disease)   . GI bleed   . History of colon polyps   . History of hiatal hernia   . Hyperlipemia   . Hypertension   . Osteoarthritis   .  PAD (peripheral artery disease) ([facility])    a. stenting of his left common iliac artery >20 years ago. b. h/o LEIA stent and 2 stents to R SFA in 2011. c. 04/2014:  s/p PTA of right SFA for in-stent restenosis, occluded left SFA  . RBBB (right bundle branch block with left anterior fascicular block)    NUCLEAR STRESS TEST, 08/18/2010 - no significant wall motion abnoramlities noted, post-stress EF 69%, normal myocardial perfusion study  . Sinus tachycardia    a. Noted during admission 04/2014 but upon review seems to be frequent finding for patient.  . Stenosis of carotid artery    a. 50% right carotid stenosis by angiogram 04/2014.     Allergies as of 09/22/2016      Reactions   Asa [aspirin] Other (See Comments)   GI bleeding   Oxycodone-acetaminophen    Dizziness, uncomfortable       Medication List    TAKE these medications   cilostazol 100 MG tablet Commonly known as:  PLETAL Take 1 tablet (100 mg total) by mouth 2 (two) times daily.   clopidogrel 75 MG tablet Commonly known as:  PLAVIX Take 1 tablet (75 mg total) by mouth daily after breakfast.   COQ10 PO Take 1 tablet by mouth daily.   Fish Oil 1200 MG Caps Take 1,200 mg by mouth daily.   glucosamine-chondroitin 500-400 MG tablet Take 1 tablet by mouth 2 (two) times daily.   HYDROcodone-acetaminophen 5-325 MG tablet Commonly known as:  NORCO/VICODIN Take 1 tablet by mouth every 6 (six) hours as needed for moderate pain.   lisinopril 5 MG tablet Commonly  known as:  PRINIVIL,ZESTRIL Take 5 mg by mouth 2 (two) times daily.   metFORMIN 1000 MG tablet Commonly known as:  GLUCOPHAGE Take 1 tablet (1,000 mg total) by mouth 2 (two) times daily with a meal.   metoprolol succinate 25 MG 24 hr tablet Commonly known as:  TOPROL-XL Take 25 mg by mouth 2 (two) times daily.   naproxen sodium 220 MG tablet Commonly known as:  ANAPROX Take 220 mg by mouth daily as needed (PAIN).   pantoprazole 40 MG tablet Commonly known as:  PROTONIX TAKE 2 TABLETS DAILY What changed:  See the new instructions.   rosuvastatin 20 MG tablet Commonly known as:  CRESTOR Take 20 mg by mouth every evening.       Prescriptions given: 1.  Vicodin#20 No Refill  Instructions: 1.  Wash the groin wound with soap and water daily and pat dry. (No tub bath-only shower)  Then put a dry gauze or washcloth there to keep this area dry daily and as needed.  Do not use Vaseline or neosporin on your incisions.  Only use soap and water on your incisions and then protect and keep dry. 2.  No driving x 2 weeks & while taking pain medication 3.  No heavy lifting x 4 weeks. 4.  Shower daily starting 09/22/16  Disposition: home  Patient's condition: is Good  Follow up: 1. [name] in 2 weeks with ABI's and RLE arterial duplex   [name], PA-C [facility] [phone] 09/22/2016  9:59 AM  - For VQI Registry use ---   Post-op:  Wound infection: No  Graft infection: No  Transfusion: No    If yes, n/a units given New Arrhythmia: No Ipsilateral amputation: No, [ ]  Minor, [ ]  BKA, [ ]  AKA Discharge patency: [x ] Primary, [ ]  Primary assisted, [ ]  Secondary, [ ]   Occluded Patency judged by: [x ] Dopper only, [ ]  Palpable graft pulse, []  Palpable distal pulse, [ ]  ABI inc. > 0.15, [ ]  Duplex Discharge ABI: R 0.55, L 0.58 D/C Ambulatory Status: Ambulatory  Complications: MI: No, [ ]  Troponin only, [ ]  EKG or Clinical CHF: No Resp failure:No, [ ]   Pneumonia, [ ]  Ventilator Chg in renal function: No, [ ]  Inc. Cr > 0.5, [ ]  Temp. Dialysis,  [ ]  Permanent dialysis Stroke: No, [ ]  Minor, [ ]  Major Return to OR: No  Reason for return to OR: [ ]  Bleeding, [ ]  Infection, [ ]  Thrombosis, [ ]  Revision Return to PVL:  Yes for decreased ABI's post operatively  Discharge medications: Statin use:  yes ASA use:  no Plavix use:  yes Beta blocker use: yes CCB use:  No ACEI use:   yes ARB use:  no Coumadin use: no

## 2016-09-22 NOTE — Telephone Encounter (Signed)
Rx(s) sent to pharmacy electronically.  

## 2016-09-22 NOTE — Care Management Note (Signed)
Case Management Note Marvetta Gibbons RN, BSN Unit 2W-Case Manager [phone]  Patient Details  Name: Christian Bailey MRN: [id number] Date of Birth: 04-19-1941  Subjective/Objective:  Pt admitted s/p fempop                   Action/Plan: PTA pt lived at home- per PT/OT pt would benefit from RW and 3n1- orders placed and notified karen with [facility] for DME- however pt discharged before DME could be delivered to room.   Expected Discharge Date:  09/22/16               Expected Discharge Plan:  Home/Self Care  In-House Referral:     Discharge planning Services  CM Consult  Post Acute Care Choice:  Durable Medical Equipment Choice offered to:  Patient  DME Arranged:  3-N-1, [place] rolling DME Agency:  [place]:  NA [place] Agency:  NA  Status of Service:  Completed, signed off  If discussed at [place] of Stay Meetings, dates discussed:    Additional Comments:  Dawayne Patricia, RN 09/22/2016, 12:27 PM

## 2016-09-22 NOTE — Progress Notes (Signed)
  Progress Note    09/22/2016 9:45 AM 1 Day Post-Op  Subjective:  No complaints  Afebrile HR  90's-100's  631'S - 970'Y systolic 63% RA  Vitals:   09/21/16 2049 09/22/16 0431  BP: (!) 135/43 (!) 115/55  Pulse: (!) 107 99  Resp: 18 18  Temp: 98 F (36.7 C) 97.9 F (36.6 C)    Physical Exam: Lungs:  Non labored Incisions:  All are healing nicely Extremities:  Right foot is warm and well perfused.   CBC    Component Value Date/Time   WBC 8.4 09/22/2016 0458   RBC 3.15 (L) 09/22/2016 0458   HGB 9.0 (L) 09/22/2016 0458   HCT 28.3 (L) 09/22/2016 0458   PLT 265 09/22/2016 0458   MCV 89.8 09/22/2016 0458   MCH 28.6 09/22/2016 0458   MCHC 31.8 09/22/2016 0458   RDW 13.1 09/22/2016 0458   LYMPHSABS 1,722 04/10/2016 0851   MONOABS 656 04/10/2016 0851   EOSABS 246 04/10/2016 0851   BASOSABS 82 04/10/2016 0851    BMET    Component Value Date/Time   NA 140 09/22/2016 0458   K 4.0 09/22/2016 0458   CL 102 09/22/2016 0458   CO2 29 09/22/2016 0458   GLUCOSE 111 (H) 09/22/2016 0458   BUN 20 09/22/2016 0458   CREATININE 1.33 (H) 09/22/2016 0458   CREATININE 1.33 (H) 04/10/2016 0851   CALCIUM 8.6 (L) 09/22/2016 0458   GFRNONAA 51 (L) 09/22/2016 0458   GFRAA 59 (L) 09/22/2016 0458    INR    Component Value Date/Time   INR 0.99 09/11/2016 1020     Intake/Output Summary (Last 24 hours) at 09/22/16 0945 Last data filed at 09/21/16 2205  Gross per 24 hour  Intake              120 ml  Output              300 ml  Net             -180 ml     Assessment:  76 y.o. male is s/p:  Right femoral above knee popliteal bypass with non-reversed ipsilateral GSV 4 Days Post-Op And 1.  left common femoral artery cannulation under ultrasound guidance 2.  Second order arterial selection 3.  Right leg runoff 4.  Conscious sedation for 18 minutes 1 Day Post-Op  Plan: -pt seen with Dr. Abbey Chatters looked good yesterday.  Will discharge pt today and f/u with Dr.  Trula Slade in 2 weeks with ABI's and arterial duplex. -discussed groin wound care with the pt.    [name], PA-C [facility] [phone] 09/22/2016 9:45 AM

## 2016-09-22 NOTE — Progress Notes (Signed)
qPhysical Therapy Treatment Patient Details Name: Moe Graca MRN: [id number] DOB: May 13, 1941 Today's Date: 09/22/2016    History of Present Illness Pt is a 76 y/o male s/p R femoral above knee popliteal bypass with left common femoral artery cannulation. PMH including but not limited to HTN, sinus tachycardia, DM, hyperlipidemia, PAD     PT Comments    Pt very pleasant and moving well with ability to perform long hall ambulation, stairs and HEP without difficulty. Pt educated for all and encouraged to perform throughout the day with wife present for education. Pt reports posterior knee soreness after gait but no significant pain.    Follow Up Recommendations  No PT follow up     Equipment Recommendations  Rolling walker with 5" wheels    Recommendations for Other Services       Precautions / Restrictions Precautions Precautions: Fall Restrictions Weight Bearing Restrictions: No    Mobility  Bed Mobility Overal bed mobility: Modified Independent                Transfers Overall transfer level: Modified independent                  Ambulation/Gait Ambulation/Gait assistance: Supervision Ambulation Distance (Feet): 450 Feet Assistive device: Rolling walker (2 wheeled) Gait Pattern/deviations: Step-through pattern;Decreased stride length   Gait velocity interpretation: Below normal speed for age/gender General Gait Details: pt limited by fatigue with bil knee soreness after gait   Stairs Stairs: Yes   Stair Management: Step to pattern;Forwards;One rail Left Number of Stairs: 4 General stair comments: cues for sequence with pt able to complete without physical assist  Wheelchair Mobility    Modified Rankin (Stroke Patients Only)       Balance     Sitting balance-Leahy Scale: Good       Standing balance-Leahy Scale: Fair                              Cognition Arousal/Alertness: Awake/alert Behavior During Therapy: WFL for  tasks assessed/performed Overall Cognitive Status: Within Functional Limits for tasks assessed                                        Exercises General Exercises - Lower Extremity Long Arc Quad: AROM;15 reps;Both;Seated Hip Flexion/Marching: AROM;15 reps;Both;Seated Toe Raises: AROM;15 reps;Both;Seated    General Comments        Pertinent Vitals/Pain Pain Assessment: No/denies pain    Home Living                      Prior Function            PT Goals (current goals can now be found in the care plan section) Progress towards PT goals: Progressing toward goals    Frequency           PT Plan Current plan remains appropriate    Co-evaluation             End of Session Equipment Utilized During Treatment: Gait belt Activity Tolerance: Patient tolerated treatment well Patient left: in chair;with call bell/phone within reach;with family/visitor present Nurse Communication: Mobility status PT Visit Diagnosis: Difficulty in walking, not elsewhere classified (R26.2) Pain - Right/Left: Right Pain - part of body: Leg     Time: 7939-0300 PT Time Calculation (min) (ACUTE ONLY): 11 min  Charges:  $Gait Training: 8-22 mins                    G Codes:       Elwyn Reach, [place] 09/22/2016, 12:25 PM

## 2016-09-22 NOTE — Care Management Important Message (Signed)
Important Message  Patient Details  Name: Christian Bailey MRN: [id number] Date of Birth: Sep 25, 1940   Medicare Important Message Given:  Yes    Nathen May 09/22/2016, 10:35 AM

## 2016-09-23 ENCOUNTER — Telehealth: Payer: Self-pay | Admitting: Surgery

## 2016-09-23 DIAGNOSIS — Z48812 Encounter for surgical aftercare following surgery on the circulatory system: Secondary | ICD-10-CM | POA: Diagnosis not present

## 2016-09-23 DIAGNOSIS — M6281 Muscle weakness (generalized): Secondary | ICD-10-CM | POA: Diagnosis not present

## 2016-09-23 DIAGNOSIS — I739 Peripheral vascular disease, unspecified: Secondary | ICD-10-CM | POA: Diagnosis not present

## 2016-09-23 DIAGNOSIS — Z95820 Peripheral vascular angioplasty status with implants and grafts: Secondary | ICD-10-CM | POA: Diagnosis not present

## 2016-09-23 DIAGNOSIS — Z4801 Encounter for change or removal of surgical wound dressing: Secondary | ICD-10-CM | POA: Diagnosis not present

## 2016-09-23 DIAGNOSIS — E1151 Type 2 diabetes mellitus with diabetic peripheral angiopathy without gangrene: Secondary | ICD-10-CM | POA: Diagnosis not present

## 2016-09-23 NOTE — Telephone Encounter (Signed)
-----   Message from Mena Goes, RN sent at 09/22/2016  1:58 PM EDT ----- Regarding: RE: 2 weeks Ask Juliann Pulse what her solution would be, This guy has to be seen w/ these labs to make sure the graft is open.   ----- Message ----- From: Reginia Naas Sent: 09/22/2016   1:47 PM To: Mena Goes, RN Subject: RE: 2 weeks                                    There are NO Labs available at this time for the next month plus.    ----- Message ----- From: Mena Goes, RN Sent: 09/22/2016   1:14 PM To: Loleta Rose Admin Pool Subject: 2 weeks                                          ----- Message ----- From: Gabriel Earing, PA-C Sent: 09/22/2016   9:47 AM To: [facility] Charge Pool  s/p right fem pop 09/18/16.  f/u with [name] in 2 weeks.  He will need ABI's and a RLE arterial duplex at that visit.  Thanks, Aldona Bar

## 2016-09-23 NOTE — Consult Note (Signed)
           [facility] Primary Care Navigator  09/23/2016  Florencio Hollibaugh 1941-03-30 [id number]   Wentto see patient today at the bedside to identify possible discharge needs but staffreports that he had beendischarged.  Patient was discharged home yesterday afternoon.  Primary care provider's office called (Amy)to notify of patient's discharge and possible need for post hospital follow-up and transition of care.   Made aware to refer patient to [facility] care management if deemed appropriate for services    For questions, please contact:  Dannielle Huh, BSN, RN- [facility] Primary Care Navigator  Telephone: [phone] [place]

## 2016-09-23 NOTE — Telephone Encounter (Signed)
spoke to pt req letter be sent with appt info  appt for [place] 4/11 and PO 4/16  '

## 2016-09-24 DIAGNOSIS — Z48812 Encounter for surgical aftercare following surgery on the circulatory system: Secondary | ICD-10-CM | POA: Diagnosis not present

## 2016-09-24 DIAGNOSIS — M6281 Muscle weakness (generalized): Secondary | ICD-10-CM | POA: Diagnosis not present

## 2016-09-24 DIAGNOSIS — I739 Peripheral vascular disease, unspecified: Secondary | ICD-10-CM | POA: Diagnosis not present

## 2016-09-24 DIAGNOSIS — Z4801 Encounter for change or removal of surgical wound dressing: Secondary | ICD-10-CM | POA: Diagnosis not present

## 2016-09-24 DIAGNOSIS — E1151 Type 2 diabetes mellitus with diabetic peripheral angiopathy without gangrene: Secondary | ICD-10-CM | POA: Diagnosis not present

## 2016-09-24 DIAGNOSIS — Z95820 Peripheral vascular angioplasty status with implants and grafts: Secondary | ICD-10-CM | POA: Diagnosis not present

## 2016-09-25 DIAGNOSIS — E1151 Type 2 diabetes mellitus with diabetic peripheral angiopathy without gangrene: Secondary | ICD-10-CM | POA: Diagnosis not present

## 2016-09-25 DIAGNOSIS — Z48812 Encounter for surgical aftercare following surgery on the circulatory system: Secondary | ICD-10-CM | POA: Diagnosis not present

## 2016-09-25 DIAGNOSIS — I739 Peripheral vascular disease, unspecified: Secondary | ICD-10-CM | POA: Diagnosis not present

## 2016-09-25 DIAGNOSIS — Z95820 Peripheral vascular angioplasty status with implants and grafts: Secondary | ICD-10-CM | POA: Diagnosis not present

## 2016-09-25 DIAGNOSIS — M6281 Muscle weakness (generalized): Secondary | ICD-10-CM | POA: Diagnosis not present

## 2016-09-25 DIAGNOSIS — Z4801 Encounter for change or removal of surgical wound dressing: Secondary | ICD-10-CM | POA: Diagnosis not present

## 2016-09-26 DIAGNOSIS — M6281 Muscle weakness (generalized): Secondary | ICD-10-CM | POA: Diagnosis not present

## 2016-09-26 DIAGNOSIS — I739 Peripheral vascular disease, unspecified: Secondary | ICD-10-CM | POA: Diagnosis not present

## 2016-09-26 DIAGNOSIS — Z95820 Peripheral vascular angioplasty status with implants and grafts: Secondary | ICD-10-CM | POA: Diagnosis not present

## 2016-09-26 DIAGNOSIS — Z4801 Encounter for change or removal of surgical wound dressing: Secondary | ICD-10-CM | POA: Diagnosis not present

## 2016-09-26 DIAGNOSIS — E1151 Type 2 diabetes mellitus with diabetic peripheral angiopathy without gangrene: Secondary | ICD-10-CM | POA: Diagnosis not present

## 2016-09-26 DIAGNOSIS — Z48812 Encounter for surgical aftercare following surgery on the circulatory system: Secondary | ICD-10-CM | POA: Diagnosis not present

## 2016-09-28 DIAGNOSIS — I739 Peripheral vascular disease, unspecified: Secondary | ICD-10-CM | POA: Diagnosis not present

## 2016-09-28 DIAGNOSIS — Z4801 Encounter for change or removal of surgical wound dressing: Secondary | ICD-10-CM | POA: Diagnosis not present

## 2016-09-28 DIAGNOSIS — Z95820 Peripheral vascular angioplasty status with implants and grafts: Secondary | ICD-10-CM | POA: Diagnosis not present

## 2016-09-28 DIAGNOSIS — M6281 Muscle weakness (generalized): Secondary | ICD-10-CM | POA: Diagnosis not present

## 2016-09-28 DIAGNOSIS — Z48812 Encounter for surgical aftercare following surgery on the circulatory system: Secondary | ICD-10-CM | POA: Diagnosis not present

## 2016-09-28 DIAGNOSIS — E1151 Type 2 diabetes mellitus with diabetic peripheral angiopathy without gangrene: Secondary | ICD-10-CM | POA: Diagnosis not present

## 2016-09-30 DIAGNOSIS — I739 Peripheral vascular disease, unspecified: Secondary | ICD-10-CM | POA: Diagnosis not present

## 2016-09-30 DIAGNOSIS — Z48812 Encounter for surgical aftercare following surgery on the circulatory system: Secondary | ICD-10-CM | POA: Diagnosis not present

## 2016-09-30 DIAGNOSIS — E1151 Type 2 diabetes mellitus with diabetic peripheral angiopathy without gangrene: Secondary | ICD-10-CM | POA: Diagnosis not present

## 2016-09-30 DIAGNOSIS — Z95820 Peripheral vascular angioplasty status with implants and grafts: Secondary | ICD-10-CM | POA: Diagnosis not present

## 2016-09-30 DIAGNOSIS — M6281 Muscle weakness (generalized): Secondary | ICD-10-CM | POA: Diagnosis not present

## 2016-09-30 DIAGNOSIS — Z4801 Encounter for change or removal of surgical wound dressing: Secondary | ICD-10-CM | POA: Diagnosis not present

## 2016-10-01 ENCOUNTER — Encounter: Payer: Self-pay | Admitting: Surgery

## 2016-10-02 ENCOUNTER — Other Ambulatory Visit: Payer: Self-pay | Admitting: *Deleted

## 2016-10-02 DIAGNOSIS — Z48812 Encounter for surgical aftercare following surgery on the circulatory system: Secondary | ICD-10-CM | POA: Diagnosis not present

## 2016-10-02 DIAGNOSIS — M6281 Muscle weakness (generalized): Secondary | ICD-10-CM | POA: Diagnosis not present

## 2016-10-02 DIAGNOSIS — Z95820 Peripheral vascular angioplasty status with implants and grafts: Secondary | ICD-10-CM | POA: Diagnosis not present

## 2016-10-02 DIAGNOSIS — I739 Peripheral vascular disease, unspecified: Secondary | ICD-10-CM | POA: Diagnosis not present

## 2016-10-02 DIAGNOSIS — E1151 Type 2 diabetes mellitus with diabetic peripheral angiopathy without gangrene: Secondary | ICD-10-CM | POA: Diagnosis not present

## 2016-10-02 DIAGNOSIS — Z4801 Encounter for change or removal of surgical wound dressing: Secondary | ICD-10-CM | POA: Diagnosis not present

## 2016-10-06 DIAGNOSIS — Z4801 Encounter for change or removal of surgical wound dressing: Secondary | ICD-10-CM | POA: Diagnosis not present

## 2016-10-06 DIAGNOSIS — Z48812 Encounter for surgical aftercare following surgery on the circulatory system: Secondary | ICD-10-CM | POA: Diagnosis not present

## 2016-10-06 DIAGNOSIS — Z95820 Peripheral vascular angioplasty status with implants and grafts: Secondary | ICD-10-CM | POA: Diagnosis not present

## 2016-10-06 DIAGNOSIS — E1151 Type 2 diabetes mellitus with diabetic peripheral angiopathy without gangrene: Secondary | ICD-10-CM | POA: Diagnosis not present

## 2016-10-06 DIAGNOSIS — M6281 Muscle weakness (generalized): Secondary | ICD-10-CM | POA: Diagnosis not present

## 2016-10-06 DIAGNOSIS — I739 Peripheral vascular disease, unspecified: Secondary | ICD-10-CM | POA: Diagnosis not present

## 2016-10-07 ENCOUNTER — Ambulatory Visit (HOSPITAL_COMMUNITY)
Admission: RE | Admit: 2016-10-07 | Discharge: 2016-10-07 | Disposition: A | Discharge status: Home / Self Care | Payer: Medicare Other | Source: Ambulatory Visit | Attending: Vascular Surgery | Admitting: Vascular Surgery

## 2016-10-07 ENCOUNTER — Ambulatory Visit (INDEPENDENT_AMBULATORY_CARE_PROVIDER_SITE_OTHER)
Admission: RE | Admit: 2016-10-07 | Discharge: 2016-10-07 | Disposition: A | Discharge status: Home / Self Care | Payer: Medicare Other | Source: Ambulatory Visit | Attending: Vascular Surgery | Admitting: Vascular Surgery

## 2016-10-07 DIAGNOSIS — I739 Peripheral vascular disease, unspecified: Secondary | ICD-10-CM

## 2016-10-08 DIAGNOSIS — M6281 Muscle weakness (generalized): Secondary | ICD-10-CM | POA: Diagnosis not present

## 2016-10-08 DIAGNOSIS — Z4801 Encounter for change or removal of surgical wound dressing: Secondary | ICD-10-CM | POA: Diagnosis not present

## 2016-10-08 DIAGNOSIS — Z95820 Peripheral vascular angioplasty status with implants and grafts: Secondary | ICD-10-CM | POA: Diagnosis not present

## 2016-10-08 DIAGNOSIS — Z48812 Encounter for surgical aftercare following surgery on the circulatory system: Secondary | ICD-10-CM | POA: Diagnosis not present

## 2016-10-08 DIAGNOSIS — E1151 Type 2 diabetes mellitus with diabetic peripheral angiopathy without gangrene: Secondary | ICD-10-CM | POA: Diagnosis not present

## 2016-10-08 DIAGNOSIS — I739 Peripheral vascular disease, unspecified: Secondary | ICD-10-CM | POA: Diagnosis not present

## 2016-10-09 ENCOUNTER — Telehealth: Payer: Self-pay | Admitting: Cardiovascular Disease

## 2016-10-09 NOTE — Telephone Encounter (Signed)
Returned the phone call to the patient. He stated that he was discharged on 3/27 from a right femoral above knee popliteal bypass procedure. He currently takes Cilostazol 100 mg twice daily and is wondering if he can/should still take this medication.

## 2016-10-09 NOTE — Telephone Encounter (Signed)
Patient can discontinue Pletal

## 2016-10-09 NOTE — Telephone Encounter (Signed)
New Message      Should the pt be continuing the prescription   cilostazol (PLETAL) 100 MG tablet Take 1 tablet (100 mg total) by mouth 2 (two) times daily.

## 2016-10-09 NOTE — Telephone Encounter (Signed)
Returned the phone call to the patient to inform him per Dr. Gwenlyn Found he could discontinue the Cilostazol. He verbalized his understanding.

## 2016-10-10 DIAGNOSIS — M19011 Primary osteoarthritis, right shoulder: Secondary | ICD-10-CM | POA: Diagnosis not present

## 2016-10-12 ENCOUNTER — Encounter: Payer: Self-pay | Admitting: Surgery

## 2016-10-12 ENCOUNTER — Ambulatory Visit (INDEPENDENT_AMBULATORY_CARE_PROVIDER_SITE_OTHER): Payer: Self-pay | Admitting: Surgery

## 2016-10-12 VITALS — BP 114/59 | HR 92 | Temp 97.9°F | Resp 20 | Ht 65.0 in | Wt 152.0 lb

## 2016-10-12 DIAGNOSIS — I70211 Atherosclerosis of native arteries of extremities with intermittent claudication, right leg: Secondary | ICD-10-CM

## 2016-10-12 NOTE — Progress Notes (Signed)
   Patient name: Christian Bailey MRN: [id number] DOB: 09-May-1941 Sex: male  REASON FOR VISIT:     claudication  HISTORY OF PRESENT ILLNESS:   Christian Bailey is a 76 y.o. male who is status post right femoral-popliteal bypass graft with vein on 09/18/2016.  This was done for claudication.  Postoperatively, the patient's ABIs were below what I thought they should be and therefore he underwent angiography which revealed a widely patent bypass graft and no evidence for the reason why the ABIs were low.  The patient is now at home.  He notices a significant improvement in his walking distance.  He is pain free.  Swelling in his leg is getting better.  CURRENT MEDICATIONS:    Current Outpatient Prescriptions  Medication Sig Dispense Refill  . clopidogrel (PLAVIX) 75 MG tablet Take 1 tablet (75 mg total) by mouth daily after breakfast.    . Coenzyme Q10 (COQ10 PO) Take 1 tablet by mouth daily.    [name] glucosamine-chondroitin 500-400 MG tablet Take 1 tablet by mouth 2 (two) times daily.    [name] HYDROcodone-acetaminophen (NORCO/VICODIN) 5-325 MG tablet Take 1 tablet by mouth every 6 (six) hours as needed for moderate pain. 20 tablet 0  . lisinopril (PRINIVIL,ZESTRIL) 5 MG tablet Take 5 mg by mouth 2 (two) times daily.     . metFORMIN (GLUCOPHAGE) 1000 MG tablet Take 1 tablet (1,000 mg total) by mouth 2 (two) times daily with a meal.    . metoprolol succinate (TOPROL-XL) 25 MG 24 hr tablet Take 25 mg by mouth 2 (two) times daily.     . naproxen sodium (ANAPROX) 220 MG tablet Take 220 mg by mouth daily as needed (PAIN).    . Omega-3 Fatty Acids (FISH OIL) 1200 MG CAPS Take 1,200 mg by mouth daily.    . pantoprazole (PROTONIX) 40 MG tablet Take 2 tablets (80 mg total) by mouth daily. 180 tablet 2  . rosuvastatin (CRESTOR) 20 MG tablet Take 20 mg by mouth every evening.      No current facility-administered medications for this visit.     REVIEW OF SYSTEMS:   [X]  denotes  positive finding, [ ]  denotes negative finding Cardiac  Comments:  Chest pain or chest pressure:    Shortness of breath upon exertion:    Short of breath when lying flat:    Irregular heart rhythm:    Constitutional    Fever or chills:      PHYSICAL EXAM:   Vitals:   10/12/16 1500  BP: (!) 114/59  Pulse: 92  Resp: 20  Temp: 97.9 F (36.6 C)  TempSrc: Oral  SpO2: 96%  Weight: 152 lb (68.9 kg)  Height: 5\' 5"  (1.651 m)    GENERAL: The patient is a well-nourished male, in no acute distress. The vital signs are documented above. CARDIOVASCULAR: There is a regular rate and rhythm. PULMONARY: Non-labored respirations The patient's incisions are healing appropriately He has a palpable dorsalis pedis pulse on the right  STUDIES:   Ultrasound evaluation was performed today which shows a widely patent bypass graft.  His ABI was 0.94   MEDICAL ISSUES:   The patient is doing very well following his right femoral-popliteal bypass graft.  He will need aggressive surveillance.  I will see him back in our office in 3 months.  He will end up getting surveillance imaging with Dr. Bing Matter, MD [facility] [phone] Pager [phone]

## 2016-10-13 DIAGNOSIS — Z48812 Encounter for surgical aftercare following surgery on the circulatory system: Secondary | ICD-10-CM | POA: Diagnosis not present

## 2016-10-13 DIAGNOSIS — E1151 Type 2 diabetes mellitus with diabetic peripheral angiopathy without gangrene: Secondary | ICD-10-CM | POA: Diagnosis not present

## 2016-10-13 DIAGNOSIS — I739 Peripheral vascular disease, unspecified: Secondary | ICD-10-CM | POA: Diagnosis not present

## 2016-10-13 DIAGNOSIS — M6281 Muscle weakness (generalized): Secondary | ICD-10-CM | POA: Diagnosis not present

## 2016-10-13 DIAGNOSIS — Z95820 Peripheral vascular angioplasty status with implants and grafts: Secondary | ICD-10-CM | POA: Diagnosis not present

## 2016-10-13 DIAGNOSIS — Z4801 Encounter for change or removal of surgical wound dressing: Secondary | ICD-10-CM | POA: Diagnosis not present

## 2016-10-13 NOTE — Addendum Note (Signed)
Addended by: Lianne Cure A on: 10/13/2016 09:08 AM   Modules accepted: Orders

## 2016-10-15 DIAGNOSIS — I739 Peripheral vascular disease, unspecified: Secondary | ICD-10-CM | POA: Diagnosis not present

## 2016-10-15 DIAGNOSIS — Z48812 Encounter for surgical aftercare following surgery on the circulatory system: Secondary | ICD-10-CM | POA: Diagnosis not present

## 2016-10-15 DIAGNOSIS — Z4801 Encounter for change or removal of surgical wound dressing: Secondary | ICD-10-CM | POA: Diagnosis not present

## 2016-10-15 DIAGNOSIS — E1151 Type 2 diabetes mellitus with diabetic peripheral angiopathy without gangrene: Secondary | ICD-10-CM | POA: Diagnosis not present

## 2016-10-15 DIAGNOSIS — M6281 Muscle weakness (generalized): Secondary | ICD-10-CM | POA: Diagnosis not present

## 2016-10-15 DIAGNOSIS — Z95820 Peripheral vascular angioplasty status with implants and grafts: Secondary | ICD-10-CM | POA: Diagnosis not present

## 2016-10-16 ENCOUNTER — Telehealth: Payer: Self-pay

## 2016-10-16 NOTE — Telephone Encounter (Signed)
rec'd phone call from [facility] RN.  Reported proximal right LE incision has a scab over it.  Reported there is no drainage, redness, or swelling of site.  Stated she advised the pt. to continue to monitor, and to report any worsening changes of the incisional area.

## 2016-10-19 DIAGNOSIS — H353124 Nonexudative age-related macular degeneration, left eye, advanced atrophic with subfoveal involvement: Secondary | ICD-10-CM | POA: Diagnosis not present

## 2016-10-19 DIAGNOSIS — H353221 Exudative age-related macular degeneration, left eye, with active choroidal neovascularization: Secondary | ICD-10-CM | POA: Diagnosis not present

## 2016-10-19 DIAGNOSIS — H353113 Nonexudative age-related macular degeneration, right eye, advanced atrophic without subfoveal involvement: Secondary | ICD-10-CM | POA: Diagnosis not present

## 2016-10-19 DIAGNOSIS — H353211 Exudative age-related macular degeneration, right eye, with active choroidal neovascularization: Secondary | ICD-10-CM | POA: Diagnosis not present

## 2016-10-22 DIAGNOSIS — N182 Chronic kidney disease, stage 2 (mild): Secondary | ICD-10-CM | POA: Diagnosis not present

## 2016-10-22 DIAGNOSIS — I739 Peripheral vascular disease, unspecified: Secondary | ICD-10-CM | POA: Diagnosis not present

## 2016-10-22 DIAGNOSIS — I10 Essential (primary) hypertension: Secondary | ICD-10-CM | POA: Diagnosis not present

## 2016-10-22 DIAGNOSIS — Z95828 Presence of other vascular implants and grafts: Secondary | ICD-10-CM | POA: Diagnosis not present

## 2016-10-22 DIAGNOSIS — Z6825 Body mass index (BMI) 25.0-25.9, adult: Secondary | ICD-10-CM | POA: Diagnosis not present

## 2016-10-22 DIAGNOSIS — D62 Acute posthemorrhagic anemia: Secondary | ICD-10-CM | POA: Diagnosis not present

## 2016-10-22 DIAGNOSIS — I2510 Atherosclerotic heart disease of native coronary artery without angina pectoris: Secondary | ICD-10-CM | POA: Diagnosis not present

## 2016-10-22 DIAGNOSIS — E1151 Type 2 diabetes mellitus with diabetic peripheral angiopathy without gangrene: Secondary | ICD-10-CM | POA: Diagnosis not present

## 2016-10-27 DIAGNOSIS — M19011 Primary osteoarthritis, right shoulder: Secondary | ICD-10-CM | POA: Diagnosis not present

## 2016-10-29 ENCOUNTER — Telehealth: Payer: Self-pay | Admitting: Cardiovascular Disease

## 2016-10-29 NOTE — Telephone Encounter (Signed)
Okay to hold Plavix for 7 days prior to his shoulder surgery.

## 2016-10-29 NOTE — Telephone Encounter (Signed)
Requesting surgical clearance:  1. Type of surgery: Rt. Shoulder:  Right reverse TSA  2. Surgeon:  Dr. Esmond Plants  3.Surgical Date:  pending  4. Medications that need to be held: Plavix   5. CAD: No--listed as resolved in 2014  6. I will defer to:  Dr. Pearla Dubonnet Information:  [facility] Phone: [phone] Fax:  [phone]

## 2016-10-29 NOTE — Telephone Encounter (Signed)
Faxed via Epic

## 2016-11-03 DIAGNOSIS — R29898 Other symptoms and signs involving the musculoskeletal system: Secondary | ICD-10-CM | POA: Diagnosis not present

## 2016-11-04 DIAGNOSIS — H59019 Keratopathy (bullous aphakic) following cataract surgery, unspecified eye: Secondary | ICD-10-CM | POA: Diagnosis not present

## 2016-11-04 DIAGNOSIS — H353232 Exudative age-related macular degeneration, bilateral, with inactive choroidal neovascularization: Secondary | ICD-10-CM | POA: Diagnosis not present

## 2016-11-04 DIAGNOSIS — H43811 Vitreous degeneration, right eye: Secondary | ICD-10-CM | POA: Diagnosis not present

## 2016-11-04 DIAGNOSIS — H353133 Nonexudative age-related macular degeneration, bilateral, advanced atrophic without subfoveal involvement: Secondary | ICD-10-CM | POA: Diagnosis not present

## 2016-11-09 DIAGNOSIS — R29898 Other symptoms and signs involving the musculoskeletal system: Secondary | ICD-10-CM | POA: Diagnosis not present

## 2016-11-12 DIAGNOSIS — R29898 Other symptoms and signs involving the musculoskeletal system: Secondary | ICD-10-CM | POA: Diagnosis not present

## 2016-11-16 DIAGNOSIS — R29898 Other symptoms and signs involving the musculoskeletal system: Secondary | ICD-10-CM | POA: Diagnosis not present

## 2016-11-18 DIAGNOSIS — R29898 Other symptoms and signs involving the musculoskeletal system: Secondary | ICD-10-CM | POA: Diagnosis not present

## 2016-11-24 DIAGNOSIS — M25669 Stiffness of unspecified knee, not elsewhere classified: Secondary | ICD-10-CM | POA: Diagnosis not present

## 2016-11-24 DIAGNOSIS — R29898 Other symptoms and signs involving the musculoskeletal system: Secondary | ICD-10-CM | POA: Diagnosis not present

## 2016-11-25 NOTE — H&P (Signed)
Christian Bailey is an 76 y.o. male.    Chief Complaint: right shoulder pain and weakness  HPI: Pt is a 76 y.o. male complaining of right shoulder pain for multiple years. Pain had continually increased since the beginning. X-rays in the clinic show end-stage arthritic changes of the right shoulder. Pt has tried various conservative treatments which have failed to alleviate their symptoms, including injections and therapy. Various options are discussed with the patient. Risks, benefits and expectations were discussed with the patient. Patient understand the risks, benefits and expectations and wishes to proceed with surgery.   PCP:  Prince Solian, MD  D/C Plans: Home  PMH: Past Medical History:  Diagnosis Date  . Anemia   . Diabetes mellitus ([place])    TYPE 2  . GERD (gastroesophageal reflux disease)   . GI bleed   . History of colon polyps   . History of hiatal hernia   . Hyperlipemia   . Hypertension   . Osteoarthritis   . PAD (peripheral artery disease) ([facility])    a. stenting of his left common iliac artery >20 years ago. b. h/o LEIA stent and 2 stents to R SFA in 2011. c. 04/2014:  s/p PTA of right SFA for in-stent restenosis, occluded left SFA  . RBBB (right bundle branch block with left anterior fascicular block)    NUCLEAR STRESS TEST, 08/18/2010 - no significant wall motion abnoramlities noted, post-stress EF 69%, normal myocardial perfusion study  . Sinus tachycardia    a. Noted during admission 04/2014 but upon review seems to be frequent finding for patient.  . Stenosis of carotid artery    a. 50% right carotid stenosis by angiogram 04/2014.    PSH: Past Surgical History:  Procedure Laterality Date  . ANGIOPLASTY / STENTING FEMORAL    . ANGIOPLASTY / STENTING ILIAC    . CEREBRAL ANGIOGRAM N/A 05/14/2014   Procedure: CEREBRAL ANGIOGRAM;  Surgeon: Lorretta Harp, MD;  Location: [facility] CATH LAB;  Service: Cardiovascular;  Laterality: N/A;  . EYE SURGERY Bilateral   .  FEMORAL ARTERY STENT Right 05/12/2010   Stented distally with a 6x100 Abbott absolute stent and proximally with a 6x60 Cook Zilver stent resulting in the reduction of the proximal segment 80% and mid segment 60-70% to 0% residual, LEFT common femoral artery stented with a 7x3 Smart stent resulting in reduction of 90% stenosis to 0% residual  . FEMORAL-POPLITEAL BYPASS GRAFT Right 09/18/2016   Procedure: BYPASS GRAFT FEMORAL-POPLITEAL ARTERY;  Surgeon: Serafina Mitchell, MD;  Location: [place];  Service: Vascular;  Laterality: Right;  . KNEE ARTHROSCOPY     left  . LOWER EXTREMITY ANGIOGRAM N/A 05/14/2014   Procedure: LOWER EXTREMITY ANGIOGRAM;  Surgeon: Lorretta Harp, MD;  Location: [facility] CATH LAB;  Service: Cardiovascular;  Laterality: N/A;  . LOWER EXTREMITY ANGIOGRAPHY N/A 09/21/2016   Procedure: Lower Extremity Angiography;  Surgeon: Conrad Flintville, MD;  Location: [place] CV LAB;  Service: Cardiovascular;  Laterality: N/A;  . PERIPHERAL VASCULAR CATHETERIZATION N/A 04/20/2016   Procedure: Lower Extremity Intervention;  Surgeon: Lorretta Harp, MD;  Location: [place] CV LAB;  Service: Cardiovascular;  Laterality: N/A;  . ROTATOR CUFF REPAIR Right 2003  . SFA Right 05/14/2014   PTA  OF RT SFA         DR BERRY    Social History:  reports that he quit smoking about 28 years ago. He has never used smokeless tobacco. He reports that he drinks alcohol. He reports that  he does not use drugs.  Allergies:  Allergies  Allergen Reactions  . Asa [Aspirin] Other (See Comments)    GI bleeding  . Oxycodone-Acetaminophen     Dizziness, uncomfortable     Medications: No current facility-administered medications for this encounter.    Current Outpatient Prescriptions  Medication Sig Dispense Refill  . clopidogrel (PLAVIX) 75 MG tablet Take 1 tablet (75 mg total) by mouth daily after breakfast.    . Coenzyme Q10 (COQ10 PO) Take 1 tablet by mouth daily.    [name] glucosamine-chondroitin 500-400 MG  tablet Take 1 tablet by mouth 2 (two) times daily.    [name] HYDROcodone-acetaminophen (NORCO/VICODIN) 5-325 MG tablet Take 1 tablet by mouth every 6 (six) hours as needed for moderate pain. 20 tablet 0  . lisinopril (PRINIVIL,ZESTRIL) 5 MG tablet Take 5 mg by mouth 2 (two) times daily.     . metFORMIN (GLUCOPHAGE) 1000 MG tablet Take 1 tablet (1,000 mg total) by mouth 2 (two) times daily with a meal.    . metoprolol succinate (TOPROL-XL) 25 MG 24 hr tablet Take 25 mg by mouth 2 (two) times daily.     . naproxen sodium (ANAPROX) 220 MG tablet Take 220 mg by mouth daily as needed (PAIN).    . Omega-3 Fatty Acids (FISH OIL) 1200 MG CAPS Take 1,200 mg by mouth daily.    . pantoprazole (PROTONIX) 40 MG tablet Take 2 tablets (80 mg total) by mouth daily. 180 tablet 2  . rosuvastatin (CRESTOR) 20 MG tablet Take 20 mg by mouth every evening.       No results found for this or any previous visit (from the past 48 hour(s)). No results found.  ROS: Pain with rom of the right upper extremity  Physical Exam:  Alert and oriented 76 y.o. male in no acute distress Cranial nerves 2-12 intact Cervical spine: full rom with no tenderness, nv intact distally Chest: active breath sounds bilaterally, no wheeze rhonchi or rales Heart: regular rate and rhythm, no murmur Abd: non tender non distended with active bowel sounds Hip is stable with rom  Right shoulder with limited rom and strength nv intact distally No rashes or edema  Assessment/Plan Assessment: right shoulder cuff arthropathy  Plan: Patient will undergo a right reverse total shoulder by Dr. Veverly Fells at [facility]. Risks benefits and expectations were discussed with the patient. Patient understand risks, benefits and expectations and wishes to proceed.

## 2016-11-26 DIAGNOSIS — Z01818 Encounter for other preprocedural examination: Secondary | ICD-10-CM | POA: Diagnosis not present

## 2016-11-26 DIAGNOSIS — Z95828 Presence of other vascular implants and grafts: Secondary | ICD-10-CM | POA: Diagnosis not present

## 2016-11-26 DIAGNOSIS — E1151 Type 2 diabetes mellitus with diabetic peripheral angiopathy without gangrene: Secondary | ICD-10-CM | POA: Diagnosis not present

## 2016-11-26 DIAGNOSIS — I2510 Atherosclerotic heart disease of native coronary artery without angina pectoris: Secondary | ICD-10-CM | POA: Diagnosis not present

## 2016-11-26 DIAGNOSIS — I739 Peripheral vascular disease, unspecified: Secondary | ICD-10-CM | POA: Diagnosis not present

## 2016-11-26 DIAGNOSIS — Z6825 Body mass index (BMI) 25.0-25.9, adult: Secondary | ICD-10-CM | POA: Diagnosis not present

## 2016-11-26 DIAGNOSIS — M19011 Primary osteoarthritis, right shoulder: Secondary | ICD-10-CM | POA: Diagnosis not present

## 2016-11-26 DIAGNOSIS — M1990 Unspecified osteoarthritis, unspecified site: Secondary | ICD-10-CM | POA: Diagnosis not present

## 2016-11-30 NOTE — Addendum Note (Signed)
Addendum  created 11/30/16 1237 by Loanne Emery, MD   Sign clinical note    

## 2016-11-30 NOTE — Anesthesia Postprocedure Evaluation (Signed)
Anesthesia Post Note  Patient: Christian Bailey  Procedure(s) Performed: Procedure(s) (LRB): BYPASS GRAFT FEMORAL-POPLITEAL ARTERY (Right)     Anesthesia Post Evaluation  Last Vitals:  Vitals:   09/22/16 0431 09/22/16 1000  BP: (!) 115/55 (!) 126/54  Pulse: 99 (!) 107  Resp: 18   Temp: 36.6 C     Last Pain:  Vitals:   09/22/16 0431  TempSrc: Oral  PainSc:                  Kimeka Badour S

## 2016-12-01 DIAGNOSIS — R29898 Other symptoms and signs involving the musculoskeletal system: Secondary | ICD-10-CM | POA: Diagnosis not present

## 2016-12-02 DIAGNOSIS — L84 Corns and callosities: Secondary | ICD-10-CM | POA: Diagnosis not present

## 2016-12-02 DIAGNOSIS — E1351 Other specified diabetes mellitus with diabetic peripheral angiopathy without gangrene: Secondary | ICD-10-CM | POA: Diagnosis not present

## 2016-12-02 DIAGNOSIS — L602 Onychogryphosis: Secondary | ICD-10-CM | POA: Diagnosis not present

## 2016-12-04 ENCOUNTER — Encounter (HOSPITAL_COMMUNITY): Payer: Self-pay | Admitting: *Deleted

## 2016-12-04 NOTE — Progress Notes (Signed)
Christian Bailey reports that he stopped Palvix as instructed.  Patient has Type II DM, reports CBG's run 110-160.  Christian Bailey states that he had an A1C drawn 2 weeks ago at Dr. Donell Beers office and that it was 6.4.  I instructed patient\ to not take Metformin the morning of OR. I instructed patient to check CBG to check CBG and if it is less than 70 to treat it with  1/2 cup of clear juice like apple juice or cranberry juice,  I instructed patient to recheck CBG in 15 minutes and if CBG is not greater than 70, to  Call [phone] (pre- op). If it is before pre-op opens to retreat as before and recheck CBG in 15 minutes. I told patient to make note of time that liquid is taken and amount, that surgical time may have to be adjusted.

## 2016-12-07 ENCOUNTER — Encounter (HOSPITAL_COMMUNITY)
Admission: RE | Disposition: A | Discharge status: Home / Self Care | Payer: Self-pay | Source: Ambulatory Visit | Attending: Orthopedic Surgery

## 2016-12-07 ENCOUNTER — Inpatient Hospital Stay (HOSPITAL_COMMUNITY): Payer: Medicare Other | Admitting: Anesthesiology

## 2016-12-07 ENCOUNTER — Encounter (HOSPITAL_COMMUNITY): Payer: Self-pay | Admitting: *Deleted

## 2016-12-07 ENCOUNTER — Inpatient Hospital Stay (HOSPITAL_COMMUNITY): Payer: Medicare Other

## 2016-12-07 ENCOUNTER — Inpatient Hospital Stay (HOSPITAL_COMMUNITY)
Admission: RE | Admit: 2016-12-07 | Discharge: 2016-12-09 | DRG: 483 | Disposition: A | Discharge status: Home / Self Care | Payer: Medicare Other | Source: Ambulatory Visit | Attending: Orthopedic Surgery | Admitting: Orthopedic Surgery

## 2016-12-07 DIAGNOSIS — E119 Type 2 diabetes mellitus without complications: Secondary | ICD-10-CM | POA: Diagnosis not present

## 2016-12-07 DIAGNOSIS — Z885 Allergy status to narcotic agent status: Secondary | ICD-10-CM | POA: Diagnosis not present

## 2016-12-07 DIAGNOSIS — Z96611 Presence of right artificial shoulder joint: Secondary | ICD-10-CM | POA: Diagnosis not present

## 2016-12-07 DIAGNOSIS — Z955 Presence of coronary angioplasty implant and graft: Secondary | ICD-10-CM

## 2016-12-07 DIAGNOSIS — M19011 Primary osteoarthritis, right shoulder: Secondary | ICD-10-CM | POA: Diagnosis not present

## 2016-12-07 DIAGNOSIS — Z7984 Long term (current) use of oral hypoglycemic drugs: Secondary | ICD-10-CM | POA: Diagnosis not present

## 2016-12-07 DIAGNOSIS — Z79899 Other long term (current) drug therapy: Secondary | ICD-10-CM | POA: Diagnosis not present

## 2016-12-07 DIAGNOSIS — I10 Essential (primary) hypertension: Secondary | ICD-10-CM | POA: Diagnosis not present

## 2016-12-07 DIAGNOSIS — Z886 Allergy status to analgesic agent status: Secondary | ICD-10-CM

## 2016-12-07 DIAGNOSIS — D649 Anemia, unspecified: Secondary | ICD-10-CM | POA: Diagnosis present

## 2016-12-07 DIAGNOSIS — E785 Hyperlipidemia, unspecified: Secondary | ICD-10-CM | POA: Diagnosis present

## 2016-12-07 DIAGNOSIS — M25511 Pain in right shoulder: Secondary | ICD-10-CM | POA: Diagnosis not present

## 2016-12-07 DIAGNOSIS — Z86010 Personal history of colonic polyps: Secondary | ICD-10-CM

## 2016-12-07 DIAGNOSIS — M25611 Stiffness of right shoulder, not elsewhere classified: Secondary | ICD-10-CM | POA: Diagnosis not present

## 2016-12-07 DIAGNOSIS — K219 Gastro-esophageal reflux disease without esophagitis: Secondary | ICD-10-CM | POA: Diagnosis not present

## 2016-12-07 DIAGNOSIS — Z7902 Long term (current) use of antithrombotics/antiplatelets: Secondary | ICD-10-CM | POA: Diagnosis not present

## 2016-12-07 DIAGNOSIS — Z87891 Personal history of nicotine dependence: Secondary | ICD-10-CM | POA: Diagnosis not present

## 2016-12-07 DIAGNOSIS — G8918 Other acute postprocedural pain: Secondary | ICD-10-CM | POA: Diagnosis not present

## 2016-12-07 DIAGNOSIS — Z471 Aftercare following joint replacement surgery: Secondary | ICD-10-CM | POA: Diagnosis not present

## 2016-12-07 HISTORY — PX: REVERSE SHOULDER ARTHROPLASTY: SHX5054

## 2016-12-07 HISTORY — DX: Personal history of other medical treatment: Z92.89

## 2016-12-07 LAB — BASIC METABOLIC PANEL
ANION GAP: 7 (ref 5–15)
BUN: 21 mg/dL — ABNORMAL HIGH (ref 6–20)
CALCIUM: 9 mg/dL (ref 8.9–10.3)
CO2: 27 mmol/L (ref 22–32)
Chloride: 106 mmol/L (ref 101–111)
Creatinine, Ser: 1.05 mg/dL (ref 0.61–1.24)
Glucose, Bld: 117 mg/dL — ABNORMAL HIGH (ref 65–99)
Potassium: 5.3 mmol/L — ABNORMAL HIGH (ref 3.5–5.1)
SODIUM: 140 mmol/L (ref 135–145)

## 2016-12-07 LAB — GLUCOSE, CAPILLARY
Glucose-Capillary: 118 mg/dL — ABNORMAL HIGH (ref 65–99)
Glucose-Capillary: 102 mg/dL — ABNORMAL HIGH (ref 65–99)
Glucose-Capillary: 166 mg/dL — ABNORMAL HIGH (ref 65–99)
Glucose-Capillary: 201 mg/dL — ABNORMAL HIGH (ref 65–99)

## 2016-12-07 LAB — CBC
HCT: 33.1 % — ABNORMAL LOW (ref 39.0–52.0)
Hemoglobin: 10.6 g/dL — ABNORMAL LOW (ref 13.0–17.0)
MCH: 29.2 pg (ref 26.0–34.0)
MCHC: 32 g/dL (ref 30.0–36.0)
MCV: 91.2 fL (ref 78.0–100.0)
PLATELETS: 313 10*3/uL (ref 150–400)
RBC: 3.63 MIL/uL — ABNORMAL LOW (ref 4.22–5.81)
RDW: 13.5 % (ref 11.5–15.5)
WBC: 8.7 10*3/uL (ref 4.0–10.5)

## 2016-12-07 SURGERY — ARTHROPLASTY, SHOULDER, TOTAL, REVERSE
Anesthesia: Regional | Laterality: Right

## 2016-12-07 MED ORDER — MIDAZOLAM HCL 2 MG/2ML IJ SOLN
INTRAMUSCULAR | Status: AC
  Filled 2016-12-07: qty 2

## 2016-12-07 MED ORDER — HYDROCODONE-ACETAMINOPHEN 5-325 MG PO TABS: 1.0000 | ORAL_TABLET | Freq: Four times a day (QID) | ORAL | 0 refills | Status: DC | PRN

## 2016-12-07 MED ORDER — LIDOCAINE HCL (CARDIAC) 20 MG/ML IV SOLN
INTRAVENOUS | Status: DC | PRN
  Administered 2016-12-07: 60 mg via INTRAVENOUS

## 2016-12-07 MED ORDER — METOPROLOL SUCCINATE ER 25 MG PO TB24
25.0000 mg | ORAL_TABLET | Freq: Two times a day (BID) | ORAL | Status: DC
  Administered 2016-12-07 – 2016-12-09 (×4): 25 mg via ORAL
  Filled 2016-12-07 (×4): qty 1

## 2016-12-07 MED ORDER — DOCUSATE SODIUM 100 MG PO CAPS
100.0000 mg | ORAL_CAPSULE | Freq: Two times a day (BID) | ORAL | Status: DC
  Administered 2016-12-07 – 2016-12-09 (×4): 100 mg via ORAL
  Filled 2016-12-07 (×4): qty 1

## 2016-12-07 MED ORDER — FENTANYL CITRATE (PF) 100 MCG/2ML IJ SOLN: 50.0000 ug | INTRAMUSCULAR | Status: DC | PRN

## 2016-12-07 MED ORDER — LACTATED RINGERS IV SOLN
INTRAVENOUS | Status: DC | PRN
  Administered 2016-12-07 (×2): via INTRAVENOUS

## 2016-12-07 MED ORDER — PHENYLEPHRINE HCL 10 MG/ML IJ SOLN
INTRAMUSCULAR | Status: DC | PRN
  Administered 2016-12-07 (×2): 80 ug via INTRAVENOUS

## 2016-12-07 MED ORDER — BUPIVACAINE-EPINEPHRINE (PF) 0.25% -1:200000 IJ SOLN
INTRAMUSCULAR | Status: AC
  Filled 2016-12-07: qty 30

## 2016-12-07 MED ORDER — FENTANYL CITRATE (PF) 250 MCG/5ML IJ SOLN
INTRAMUSCULAR | Status: AC
  Filled 2016-12-07: qty 5

## 2016-12-07 MED ORDER — SUCCINYLCHOLINE CHLORIDE 200 MG/10ML IV SOSY
PREFILLED_SYRINGE | INTRAVENOUS | Status: AC
  Filled 2016-12-07: qty 10

## 2016-12-07 MED ORDER — DEXAMETHASONE SODIUM PHOSPHATE 10 MG/ML IJ SOLN
INTRAMUSCULAR | Status: AC
  Filled 2016-12-07: qty 1

## 2016-12-07 MED ORDER — CLOPIDOGREL BISULFATE 75 MG PO TABS: 75.0000 mg | ORAL_TABLET | Freq: Every day | ORAL | Status: DC

## 2016-12-07 MED ORDER — PROPOFOL 10 MG/ML IV BOLUS
INTRAVENOUS | Status: DC | PRN
  Administered 2016-12-07: 120 mg via INTRAVENOUS

## 2016-12-07 MED ORDER — HYDROMORPHONE HCL 1 MG/ML IJ SOLN
1.0000 mg | INTRAMUSCULAR | Status: DC | PRN
  Administered 2016-12-08 – 2016-12-09 (×3): 1 mg via INTRAVENOUS
  Filled 2016-12-07 (×4): qty 1

## 2016-12-07 MED ORDER — SODIUM CHLORIDE 0.9 % IV SOLN
INTRAVENOUS | Status: DC | PRN
  Administered 2016-12-07: 25 ug/min via INTRAVENOUS

## 2016-12-07 MED ORDER — BUPIVACAINE-EPINEPHRINE 0.25% -1:200000 IJ SOLN
INTRAMUSCULAR | Status: DC | PRN
  Administered 2016-12-07: 5 mL

## 2016-12-07 MED ORDER — MEPERIDINE HCL 25 MG/ML IJ SOLN: 6.2500 mg | INTRAMUSCULAR | Status: DC | PRN

## 2016-12-07 MED ORDER — MIDAZOLAM HCL 2 MG/2ML IJ SOLN: 1.0000 mg | INTRAMUSCULAR | Status: DC | PRN

## 2016-12-07 MED ORDER — LISINOPRIL 5 MG PO TABS
5.0000 mg | ORAL_TABLET | Freq: Two times a day (BID) | ORAL | Status: DC
  Administered 2016-12-07 – 2016-12-09 (×3): 5 mg via ORAL
  Filled 2016-12-07 (×4): qty 1

## 2016-12-07 MED ORDER — PROPOFOL 10 MG/ML IV BOLUS
INTRAVENOUS | Status: AC
  Filled 2016-12-07: qty 20

## 2016-12-07 MED ORDER — FENTANYL CITRATE (PF) 100 MCG/2ML IJ SOLN: 25.0000 ug | INTRAMUSCULAR | Status: DC | PRN

## 2016-12-07 MED ORDER — LIDOCAINE 2% (20 MG/ML) 5 ML SYRINGE
INTRAMUSCULAR | Status: AC
  Filled 2016-12-07: qty 5

## 2016-12-07 MED ORDER — ONDANSETRON HCL 4 MG/2ML IJ SOLN
INTRAMUSCULAR | Status: AC
  Filled 2016-12-07: qty 2

## 2016-12-07 MED ORDER — SODIUM CHLORIDE 0.9 % IV SOLN
INTRAVENOUS | Status: DC
  Administered 2016-12-08: 11:00:00 via INTRAVENOUS

## 2016-12-07 MED ORDER — LACTATED RINGERS IV SOLN
INTRAVENOUS | Status: DC
  Administered 2016-12-07: 11:00:00 via INTRAVENOUS

## 2016-12-07 MED ORDER — FENTANYL CITRATE (PF) 100 MCG/2ML IJ SOLN
INTRAMUSCULAR | Status: AC
  Filled 2016-12-07: qty 2

## 2016-12-07 MED ORDER — ROSUVASTATIN CALCIUM 10 MG PO TABS
20.0000 mg | ORAL_TABLET | Freq: Every evening | ORAL | Status: DC
  Administered 2016-12-07 – 2016-12-08 (×2): 20 mg via ORAL
  Filled 2016-12-07 (×2): qty 2

## 2016-12-07 MED ORDER — PHENOL 1.4 % MT LIQD: 1.0000 | OROMUCOSAL | Status: DC | PRN

## 2016-12-07 MED ORDER — MENTHOL 3 MG MT LOZG: 1.0000 | LOZENGE | OROMUCOSAL | Status: DC | PRN

## 2016-12-07 MED ORDER — SODIUM CHLORIDE 0.9 % IR SOLN
Status: DC | PRN
  Administered 2016-12-07: 1000 mL

## 2016-12-07 MED ORDER — METFORMIN HCL 500 MG PO TABS
1000.0000 mg | ORAL_TABLET | Freq: Two times a day (BID) | ORAL | Status: DC
  Administered 2016-12-08 – 2016-12-09 (×3): 1000 mg via ORAL
  Filled 2016-12-07 (×4): qty 2

## 2016-12-07 MED ORDER — METHOCARBAMOL 500 MG PO TABS
500.0000 mg | ORAL_TABLET | Freq: Four times a day (QID) | ORAL | Status: DC | PRN
  Administered 2016-12-08 – 2016-12-09 (×2): 500 mg via ORAL
  Filled 2016-12-07 (×2): qty 1

## 2016-12-07 MED ORDER — ROCURONIUM BROMIDE 100 MG/10ML IV SOLN
INTRAVENOUS | Status: DC | PRN
  Administered 2016-12-07: 30 mg via INTRAVENOUS

## 2016-12-07 MED ORDER — GLUCOSAMINE-CHONDROITIN 500-400 MG PO TABS: 1.0000 | ORAL_TABLET | Freq: Two times a day (BID) | ORAL | Status: DC

## 2016-12-07 MED ORDER — DEXAMETHASONE SODIUM PHOSPHATE 10 MG/ML IJ SOLN
INTRAMUSCULAR | Status: DC | PRN
  Administered 2016-12-07: 4 mg via INTRAVENOUS

## 2016-12-07 MED ORDER — BISACODYL 10 MG RE SUPP: 10.0000 mg | Freq: Every day | RECTAL | Status: DC | PRN

## 2016-12-07 MED ORDER — PROMETHAZINE HCL 25 MG/ML IJ SOLN: 6.2500 mg | INTRAMUSCULAR | Status: DC | PRN

## 2016-12-07 MED ORDER — HYDROCODONE-ACETAMINOPHEN 5-325 MG PO TABS
1.0000 | ORAL_TABLET | ORAL | Status: DC | PRN
  Administered 2016-12-08 (×2): 1 via ORAL
  Administered 2016-12-08 – 2016-12-09 (×2): 2 via ORAL
  Filled 2016-12-07: qty 2
  Filled 2016-12-07: qty 1
  Filled 2016-12-07: qty 2
  Filled 2016-12-07: qty 1

## 2016-12-07 MED ORDER — ACETAMINOPHEN 325 MG PO TABS: 650.0000 mg | ORAL_TABLET | Freq: Four times a day (QID) | ORAL | Status: DC | PRN

## 2016-12-07 MED ORDER — SODIUM CHLORIDE 0.9 % IV BOLUS (SEPSIS)
500.0000 mL | Freq: Once | INTRAVENOUS | Status: AC
  Administered 2016-12-07: 500 mL via INTRAVENOUS

## 2016-12-07 MED ORDER — BUPIVACAINE-EPINEPHRINE (PF) 0.5% -1:200000 IJ SOLN
INTRAMUSCULAR | Status: DC | PRN
  Administered 2016-12-07: 30 mL via PERINEURAL

## 2016-12-07 MED ORDER — METHOCARBAMOL 1000 MG/10ML IJ SOLN
500.0000 mg | Freq: Four times a day (QID) | INTRAVENOUS | Status: DC | PRN
  Filled 2016-12-07: qty 5

## 2016-12-07 MED ORDER — ONDANSETRON HCL 4 MG/2ML IJ SOLN
INTRAMUSCULAR | Status: DC | PRN
  Administered 2016-12-07: 4 mg via INTRAVENOUS

## 2016-12-07 MED ORDER — ONDANSETRON HCL 4 MG PO TABS: 4.0000 mg | ORAL_TABLET | Freq: Four times a day (QID) | ORAL | Status: DC | PRN

## 2016-12-07 MED ORDER — CEFAZOLIN SODIUM-DEXTROSE 2-4 GM/100ML-% IV SOLN
2.0000 g | INTRAVENOUS | Status: AC
  Administered 2016-12-07: 2 g via INTRAVENOUS
  Filled 2016-12-07: qty 100

## 2016-12-07 MED ORDER — ACETAMINOPHEN 650 MG RE SUPP: 650.0000 mg | Freq: Four times a day (QID) | RECTAL | Status: DC | PRN

## 2016-12-07 MED ORDER — METOCLOPRAMIDE HCL 5 MG PO TABS: 5.0000 mg | ORAL_TABLET | Freq: Three times a day (TID) | ORAL | Status: DC | PRN

## 2016-12-07 MED ORDER — CHLORHEXIDINE GLUCONATE 4 % EX LIQD: 60.0000 mL | Freq: Once | CUTANEOUS | Status: DC

## 2016-12-07 MED ORDER — POLYETHYLENE GLYCOL 3350 17 G PO PACK: 17.0000 g | PACK | Freq: Every day | ORAL | Status: DC | PRN

## 2016-12-07 MED ORDER — CEFAZOLIN SODIUM-DEXTROSE 2-4 GM/100ML-% IV SOLN
2.0000 g | Freq: Four times a day (QID) | INTRAVENOUS | Status: AC
  Administered 2016-12-07 – 2016-12-08 (×3): 2 g via INTRAVENOUS
  Filled 2016-12-07 (×3): qty 100

## 2016-12-07 MED ORDER — METOCLOPRAMIDE HCL 5 MG/ML IJ SOLN
5.0000 mg | Freq: Three times a day (TID) | INTRAMUSCULAR | Status: DC | PRN
  Administered 2016-12-09: 10 mg via INTRAVENOUS
  Filled 2016-12-07: qty 2

## 2016-12-07 MED ORDER — PANTOPRAZOLE SODIUM 40 MG PO TBEC
80.0000 mg | DELAYED_RELEASE_TABLET | Freq: Every day | ORAL | Status: DC
  Administered 2016-12-08 – 2016-12-09 (×2): 80 mg via ORAL
  Filled 2016-12-07 (×2): qty 2

## 2016-12-07 MED ORDER — MUPIROCIN 2 % EX OINT
TOPICAL_OINTMENT | CUTANEOUS | Status: AC
  Filled 2016-12-07: qty 22

## 2016-12-07 MED ORDER — OMEGA-3-ACID ETHYL ESTERS 1 G PO CAPS
1.0000 g | ORAL_CAPSULE | Freq: Every day | ORAL | Status: DC
  Administered 2016-12-08 – 2016-12-09 (×2): 1 g via ORAL
  Filled 2016-12-07 (×2): qty 1

## 2016-12-07 MED ORDER — METHOCARBAMOL 500 MG PO TABS: 500.0000 mg | ORAL_TABLET | Freq: Three times a day (TID) | ORAL | 1 refills | Status: DC | PRN

## 2016-12-07 MED ORDER — SUGAMMADEX SODIUM 200 MG/2ML IV SOLN
INTRAVENOUS | Status: AC
  Filled 2016-12-07: qty 2

## 2016-12-07 MED ORDER — ONDANSETRON HCL 4 MG/2ML IJ SOLN
4.0000 mg | Freq: Four times a day (QID) | INTRAMUSCULAR | Status: DC | PRN
  Administered 2016-12-07 – 2016-12-09 (×2): 4 mg via INTRAVENOUS
  Filled 2016-12-07 (×2): qty 2

## 2016-12-07 MED ORDER — FENTANYL CITRATE (PF) 100 MCG/2ML IJ SOLN
INTRAMUSCULAR | Status: DC | PRN
  Administered 2016-12-07: 50 ug via INTRAVENOUS

## 2016-12-07 MED ORDER — SUGAMMADEX SODIUM 200 MG/2ML IV SOLN
INTRAVENOUS | Status: DC | PRN
  Administered 2016-12-07: 143.4 mg via INTRAVENOUS

## 2016-12-07 SURGICAL SUPPLY — 64 items
BIT DRILL 5/64X5 DISP (BIT) ×3 IMPLANT
BLADE SAG 18X100X1.27 (BLADE) ×3 IMPLANT
BOWL CEMENT MIX W/ADAPTER (MISCELLANEOUS) ×3 IMPLANT
CAPT SHLDR REVTOTAL 1 ×3 IMPLANT
CEMENT HV SMART SET (Cement) ×3 IMPLANT
CLOSURE WOUND 1/2 X4 (GAUZE/BANDAGES/DRESSINGS) ×1
COVER SURGICAL LIGHT HANDLE (MISCELLANEOUS) ×3 IMPLANT
DRAPE IMP U-DRAPE 54X76 (DRAPES) ×6 IMPLANT
DRAPE INCISE IOBAN 66X45 STRL (DRAPES) ×3 IMPLANT
DRAPE ORTHO SPLIT 77X108 STRL (DRAPES) ×4
DRAPE SURG ORHT 6 SPLT 77X108 (DRAPES) ×2 IMPLANT
DRAPE U-SHAPE 47X51 STRL (DRAPES) ×3 IMPLANT
DRSG ADAPTIC 3X8 NADH LF (GAUZE/BANDAGES/DRESSINGS) ×3 IMPLANT
DRSG PAD ABDOMINAL 8X10 ST (GAUZE/BANDAGES/DRESSINGS) ×3 IMPLANT
DURAPREP 26ML APPLICATOR (WOUND CARE) ×6 IMPLANT
ELECT BLADE 4.0 EZ CLEAN MEGAD (MISCELLANEOUS) ×3
ELECT NEEDLE TIP 2.8 STRL (NEEDLE) ×3 IMPLANT
ELECT REM PT RETURN 9FT ADLT (ELECTROSURGICAL) ×3
ELECTRODE BLDE 4.0 EZ CLN MEGD (MISCELLANEOUS) ×1 IMPLANT
ELECTRODE REM PT RTRN 9FT ADLT (ELECTROSURGICAL) ×1 IMPLANT
GAUZE SPONGE 4X4 12PLY STRL (GAUZE/BANDAGES/DRESSINGS) ×3 IMPLANT
GLOVE BIO SURGEON STRL SZ7 (GLOVE) ×3 IMPLANT
GLOVE BIOGEL PI IND STRL 7.0 (GLOVE) IMPLANT
GLOVE BIOGEL PI INDICATOR 7.0 (GLOVE)
GLOVE BIOGEL PI ORTHO PRO 7.5 (GLOVE) ×2
GLOVE BIOGEL PI ORTHO PRO SZ8 (GLOVE) ×2
GLOVE ORTHO TXT STRL SZ7.5 (GLOVE) ×3 IMPLANT
GLOVE PI ORTHO PRO STRL 7.5 (GLOVE) ×1 IMPLANT
GLOVE PI ORTHO PRO STRL SZ8 (GLOVE) ×1 IMPLANT
GLOVE SURG ORTHO 8.5 STRL (GLOVE) ×6 IMPLANT
GLOVE SURG SS PI 6.5 STRL IVOR (GLOVE) ×6 IMPLANT
GOWN STRL REUS W/ TWL LRG LVL3 (GOWN DISPOSABLE) ×2 IMPLANT
GOWN STRL REUS W/ TWL XL LVL3 (GOWN DISPOSABLE) ×3 IMPLANT
GOWN STRL REUS W/TWL LRG LVL3 (GOWN DISPOSABLE) ×4
GOWN STRL REUS W/TWL XL LVL3 (GOWN DISPOSABLE) ×6
KIT BASIN OR (CUSTOM PROCEDURE TRAY) ×3 IMPLANT
KIT ROOM TURNOVER OR (KITS) ×3 IMPLANT
MANIFOLD NEPTUNE II (INSTRUMENTS) ×3 IMPLANT
NEEDLE 1/2 CIR MAYO (NEEDLE) ×3 IMPLANT
NEEDLE HYPO 25GX1X1/2 BEV (NEEDLE) ×3 IMPLANT
NS IRRIG 1000ML POUR BTL (IV SOLUTION) ×3 IMPLANT
PACK SHOULDER (CUSTOM PROCEDURE TRAY) ×3 IMPLANT
PAD ARMBOARD 7.5X6 YLW CONV (MISCELLANEOUS) ×6 IMPLANT
SLING ARM FOAM STRAP LRG (SOFTGOODS) ×3 IMPLANT
SPONGE LAP 18X18 X RAY DECT (DISPOSABLE) IMPLANT
SPONGE LAP 4X18 X RAY DECT (DISPOSABLE) ×3 IMPLANT
STRIP CLOSURE SKIN 1/2X4 (GAUZE/BANDAGES/DRESSINGS) ×2 IMPLANT
SUCTION FRAZIER HANDLE 10FR (MISCELLANEOUS) ×2
SUCTION TUBE FRAZIER 10FR DISP (MISCELLANEOUS) ×1 IMPLANT
SUT FIBERWIRE #2 38 T-5 BLUE (SUTURE) ×6
SUT MNCRL AB 4-0 PS2 18 (SUTURE) ×3 IMPLANT
SUT VIC AB 0 CT2 27 (SUTURE) ×3 IMPLANT
SUT VIC AB 2-0 CT1 27 (SUTURE) ×2
SUT VIC AB 2-0 CT1 TAPERPNT 27 (SUTURE) ×1 IMPLANT
SUT VICRYL 0 CT 1 36IN (SUTURE) ×3 IMPLANT
SUTURE FIBERWR #2 38 T-5 BLUE (SUTURE) ×2 IMPLANT
SYR CONTROL 10ML LL (SYRINGE) ×3 IMPLANT
TOWEL OR 17X24 6PK STRL BLUE (TOWEL DISPOSABLE) ×3 IMPLANT
TOWEL OR 17X26 10 PK STRL BLUE (TOWEL DISPOSABLE) ×3 IMPLANT
TOWER CARTRIDGE SMART MIX (DISPOSABLE) IMPLANT
TUBE CONNECTING 12'X1/4 (SUCTIONS) ×1
TUBE CONNECTING 12X1/4 (SUCTIONS) ×2 IMPLANT
WATER STERILE IRR 1000ML POUR (IV SOLUTION) ×3 IMPLANT
YANKAUER SUCT BULB TIP NO VENT (SUCTIONS) ×3 IMPLANT

## 2016-12-07 NOTE — Brief Op Note (Signed)
12/07/2016  4:38 PM  PATIENT:  Christian Bailey  76 y.o. male  PRE-OPERATIVE DIAGNOSIS:  Right shoulder osteoarthritis, end stage  POST-OPERATIVE DIAGNOSIS:  Right shoulder osteoarthritis, end stage  PROCEDURE:  Procedure(s): REVERSE SHOULDER ARTHROPLASTY (Right) DePuy Delta Xtend  SURGEON:  Surgeon(s) and Role:    Netta Cedars, MD - Primary  PHYSICIAN ASSISTANT:   ASSISTANTS: Ventura Bruns, PA-C   ANESTHESIA:   regional and general  EBL:  Total I/O In: 1600 [I.V.:1600] Out: 100 [Blood:100]  BLOOD ADMINISTERED:none  DRAINS: none   LOCAL MEDICATIONS USED:  MARCAINE     SPECIMEN:  No Specimen  DISPOSITION OF SPECIMEN:  N/A  COUNTS:  YES  TOURNIQUET:  * No tourniquets in log *  DICTATION: .Other Dictation: Dictation Number [id number]  PLAN OF CARE: Admit to inpatient   PATIENT DISPOSITION:  PACU - hemodynamically stable.   Delay start of Pharmacological VTE agent (>24hrs) due to surgical blood loss or risk of bleeding: not applicable

## 2016-12-07 NOTE — Anesthesia Procedure Notes (Signed)
Anesthesia Regional Block: Interscalene brachial plexus block   Pre-Anesthetic Checklist: ,, timeout performed, Correct Patient, Correct Site, Correct Laterality, Correct Procedure, Correct Position, site marked, Risks and benefits discussed,  Surgical consent,  Pre-op evaluation,  At surgeon's request and post-op pain management  Laterality: Right  Prep: chloraprep       Needles:  Injection technique: Single-shot  Needle Type: Stimulator Needle - 40     Needle Length: 4cm  Needle Gauge: 22     Additional Needles:   Procedures:, nerve stimulator,,,,,,,  Narrative:  Start time: 12/07/2016 1:18 PM End time: 12/07/2016 1:20 PM Injection made incrementally with aspirations every 5 mL. Anesthesiologist: Nolon Nations  Additional Notes: BP cuff, EKG monitors applied. Sedation begun. Nerve location verified with U/S. Anesthetic injected incrementally, slowly , and after neg aspirations under direct u/s guidance. Good perineural spread. Tolerated well.

## 2016-12-07 NOTE — Anesthesia Procedure Notes (Signed)
Procedure Name: Intubation Date/Time: 12/07/2016 1:42 PM Performed by: Neldon Newport Pre-anesthesia Checklist: Patient identified, Emergency Drugs available, Suction available and Patient being monitored Patient Re-evaluated:Patient Re-evaluated prior to inductionOxygen Delivery Method: Circle System Utilized Preoxygenation: Pre-oxygenation with 100% oxygen Intubation Type: IV induction Ventilation: Mask ventilation without difficulty Laryngoscope Size: Glidescope (MAC 4 x1 with no view due to stiff neck, grade 1 view with glidescope use) Grade View: Grade I Tube type: Oral Tube size: 7.5 mm Number of attempts: 1 Airway Equipment and Method: Stylet Placement Confirmation: ETT inserted through vocal cords under direct vision,  positive ETCO2 and breath sounds checked- equal and bilateral Secured at: 22 cm Tube secured with: Tape Dental Injury: Teeth and Oropharynx as per pre-operative assessment  Difficulty Due To: Difficult Airway- due to reduced neck mobility

## 2016-12-07 NOTE — Progress Notes (Signed)
MD called regarding patients blood pressure 78/43. Dixon returned called and advised patients continuous fluid rate be changed to 100 ccs/hr with an additional 500 cc bolus to be administered. Nursing will continue to monitor.

## 2016-12-07 NOTE — Anesthesia Preprocedure Evaluation (Addendum)
Anesthesia Evaluation  Patient identified by MRN, date of birth, ID band Patient awake    Reviewed: Allergy & Precautions, NPO status , Patient's Chart, lab work & pertinent test results  History of Anesthesia Complications (+) PONV and history of anesthetic complications  Airway Mallampati: II  TM Distance: >3 FB Neck ROM: Full    Dental no notable dental hx. (+) Teeth Intact   Pulmonary neg pulmonary ROS, former smoker,    Pulmonary exam normal breath sounds clear to auscultation       Cardiovascular hypertension, On Medications and On Home Beta Blockers + Peripheral Vascular Disease  Normal cardiovascular exam+ dysrhythmias  Rhythm:Regular Rate:Normal     Neuro/Psych negative neurological ROS  negative psych ROS   GI/Hepatic Neg liver ROS, hiatal hernia, PUD, GERD  ,  Endo/Other  diabetes, Type 2  Renal/GU negative Renal ROS  negative genitourinary   Musculoskeletal negative musculoskeletal ROS (+) Arthritis ,   Abdominal   Peds  Hematology negative hematology ROS (+) anemia ,   Anesthesia Other Findings   Reproductive/Obstetrics negative OB ROS                            Anesthesia Physical  Anesthesia Plan  ASA: III  Anesthesia Plan: General and Regional   Post-op Pain Management:  Regional for Post-op pain   Induction: Intravenous  PONV Risk Score and Plan: 3 and Ondansetron, Dexamethasone, Propofol and Treatment may vary due to age or medical condition  Airway Management Planned: Oral ETT  Additional Equipment:   Intra-op Plan:   Post-operative Plan: Extubation in OR  Informed Consent: I have reviewed the patients History and Physical, chart, labs and discussed the procedure including the risks, benefits and alternatives for the proposed anesthesia with the patient or authorized representative who has indicated his/her understanding and acceptance.   Dental  advisory given  Plan Discussed with: CRNA  Anesthesia Plan Comments:         Anesthesia Quick Evaluation

## 2016-12-07 NOTE — Discharge Instructions (Signed)
Ice to the shoulder as much as you can.  Keep the incision clean and dry and covered for one week, then ok to get it wet in the shower.  Ok to remove the sling while seated in the home and ok to use it for light activity - nothing heavy!  Use the sling as you need to for being up and around and out of the home  Do exercises at least 4 times per day as instructed  Follow up in the office in two weeks with Dr Veverly Fells [phone]

## 2016-12-07 NOTE — Transfer of Care (Cosign Needed)
Immediate Anesthesia Transfer of Care Note  Patient: Christian Bailey  Procedure(s) Performed: Procedure(s): REVERSE SHOULDER ARTHROPLASTY (Right)  Patient Location: PACU  Anesthesia Type:GA combined with regional for post-op pain  Level of Consciousness: awake, alert  and oriented  Airway & Oxygen Therapy: Patient Spontanous Breathing and Patient connected to nasal cannula oxygen  Post-op Assessment: Report given to RN and Post -op Vital signs reviewed and stable  Post vital signs: Reviewed and stable  Last Vitals:  Vitals:   12/07/16 1006  BP: (!) 135/54  Pulse: 82  Resp: 18  Temp: 36.6 C    Last Pain:  Vitals:   12/07/16 1021  TempSrc:   PainSc: 3       Patients Stated Pain Goal: 8 (28/11/88 6773)  Complications: No apparent anesthesia complications

## 2016-12-07 NOTE — Anesthesia Postprocedure Evaluation (Signed)
Anesthesia Post Note  Patient: Christian Bailey  Procedure(s) Performed: Procedure(s) (LRB): REVERSE SHOULDER ARTHROPLASTY (Right)     Patient location during evaluation: PACU Anesthesia Type: Regional and General Level of consciousness: sedated and patient cooperative Pain management: pain level controlled Vital Signs Assessment: post-procedure vital signs reviewed and stable Respiratory status: spontaneous breathing Cardiovascular status: stable Anesthetic complications: no    Last Vitals:  Vitals:   12/07/16 1715 12/07/16 1720  BP:  123/63  Pulse: 100 100  Resp: 17 17  Temp:      Last Pain:  Vitals:   12/07/16 1715  TempSrc:   PainSc: 0-No pain                 Nolon Nations

## 2016-12-07 NOTE — Interval H&P Note (Signed)
History and Physical Interval Note:  12/07/2016 1:18 PM  Christian Bailey  has presented today for surgery, with the diagnosis of Right shoulder osteoarthritis  The various methods of treatment have been discussed with the patient and family. After consideration of risks, benefits and other options for treatment, the patient has consented to  Procedure(s): REVERSE SHOULDER ARTHROPLASTY (Right) as a surgical intervention .  The patient's history has been reviewed, patient examined, no change in status, stable for surgery.  I have reviewed the patient's chart and labs.  Questions were answered to the patient's satisfaction.     Caidin Heidenreich,STEVEN R

## 2016-12-08 ENCOUNTER — Encounter (HOSPITAL_COMMUNITY): Payer: Self-pay | Admitting: General Practice

## 2016-12-08 LAB — BASIC METABOLIC PANEL
ANION GAP: 8 (ref 5–15)
BUN: 19 mg/dL (ref 6–20)
CHLORIDE: 101 mmol/L (ref 101–111)
CO2: 29 mmol/L (ref 22–32)
Calcium: 8.4 mg/dL — ABNORMAL LOW (ref 8.9–10.3)
Creatinine, Ser: 1.09 mg/dL (ref 0.61–1.24)
GFR calc non Af Amer: >60 mL/min (ref >60)
Glucose, Bld: 216 mg/dL — ABNORMAL HIGH (ref 65–99)
Potassium: 5 mmol/L (ref 3.5–5.1)
Sodium: 138 mmol/L (ref 135–145)

## 2016-12-08 LAB — HEMOGLOBIN AND HEMATOCRIT, BLOOD
HCT: 27.8 % — ABNORMAL LOW (ref 39.0–52.0)
Hemoglobin: 8.8 g/dL — ABNORMAL LOW (ref 13.0–17.0)

## 2016-12-08 LAB — GLUCOSE, CAPILLARY: Glucose-Capillary: 217 mg/dL — ABNORMAL HIGH (ref 65–99)

## 2016-12-08 NOTE — Op Note (Signed)
NAMETIMOTH, SCHARA NO.:  [id number]  MEDICAL RECORD NO.:  [id number]  LOCATION:                                 FACILITY:  PHYSICIAN:  Fountain Veverly Fells, M.D.      DATE OF BIRTH:  DATE OF PROCEDURE:  12/07/2016 DATE OF DISCHARGE:                              OPERATIVE REPORT   POSTOPERATIVE DIAGNOSIS:  Right shoulder end-stage osteoarthritis and severe stiffness.  POSTOPERATIVE DIAGNOSES:  Right shoulder end-stage osteoarthritis and severe stiffness as well as rotator cuff dysfunction.  PROCEDURE PERFORMED:  Right shoulder reverse total shoulder arthroplasty using DePuy Delta Xtend prosthesis.  ATTENDING SURGEON:  Doran Heater. Veverly Fells, MD.  ASSISTANT:  Darol Destine, [facility], who was scrubbed during the entire procedure and necessary for satisfactory completion of surgery.  ANESTHESIA:  General anesthesia was used plus interscalene block.  ESTIMATED BLOOD LOSS:  Minimal.  FLUID REPLACEMENT:  1200 mL crystalloid.  INSTRUMENT COUNTS:  Correct.  COMPLICATIONS:  There were no complications.  ANTIBIOTICS:  Perioperative antibiotics were given.  INDICATIONS:  The patient is a 76 year old male, who has had a history of prior right shoulder rotator cuff surgery remotely.  The patient has had progressively increasing pain over the last several years, now to the point where he cannot move his arm at all.  He is not able to use his arm for ADLs and has constant severe pain requiring narcotic pain medication.  The patient presents with evidence of bone-on-bone arthritis on x-ray and also evidence of superior head migration concerning for rotator cuff tear arthropathy.  Given complete failure of conservative management, the patient now presents for reverse total shoulder arthroplasty to restore function and eliminate pain.  Informed consent obtained.  DESCRIPTION OF PROCEDURE:  After an adequate level of anesthesia achieved, the patient was positioned in  modified beach-chair position. Right shoulder correctly identified, sterilely prepped and draped in usual manner.  I did examine the right shoulder under anesthesia after a time-out was called verifying correct patient, correct site.  The patient had almost no rotational motion maybe 20 degrees tops, forward elevation about 45, abduction 30, extremely limited motion, completely scarred.  We went ahead after sterile prep and drape of the shoulder and arm and entered the shoulder using standard deltopectoral approach, starting at the coracoid process, extending down to the anterior humerus.  Dissection down through subcutaneous tissues, we identified the deltopectoral interval with the cephalic vein.  Took the cephalic vein laterally with the deltoid, pectoralis taken medially.  Conjoint tendon identified.  It was basically scarred down to the what was left of the subscap.  Essentially, this entire area was scarred including the subdeltoid, which was completely scarred down to the rotator cuff into the lateral humerus.  Once we were able to free up the scar tissue, we went ahead and did a tenodesis of the biceps tendon with 0 Vicryl figure- of-eight through the pec tendon and the biceps for soft tissue tenodesis.  We then released the subscapularis, which was really thin and flimsy, almost a pseudocapsule.  We released that off the anterior humerus, did an inferior capsular release off the humerus.  As we progressively externally  rotated, large osteophytes were noted inferiorly and a large effusion.  We placed #2 FiberWire suture to retract the pseudocapsule/subscap.  The axillary nerve was identified and palpated and it basically went into a very scarred tissue plane superficial and anterior and inferior to the subscap remnant.  We did our best to free that up from the subscap remnant and make sure that it was not tethered.  Once we had that freed, we had better control over that axillary  nerve being protected during the remainder of the surgery. We noted there to be complete bone-on-bone.  There was no cartilage remaining.  We then extended the shoulder, released the biceps to the joint as well as the repaired rotator cuff.  The repair seemed to be intact, but it was complete scar tissue.  There was really nothing that looked like normal tendon, nothing that had any excursion to it, so we did release that, supraspinatus; infraspinatus; and the teres as that was a scar ball as well.  Once that was released, we extended the shoulder enough to be able to enter the proximal humerus with a 6 mm reamer, reamed up to a size 12.  We then introduced the intramedullary guide size 12 and then resected the head to 10 degrees of retroversion using the intramedullary resection guide with an oscillating saw.  We removed our guide and then used a large rongeur to remove the osteophytes on the inferior humeral neck all the way around to the back. At this point, we completed our humeral preparation with the reamer, placed our reaming guide in first for the epi-2 right and then went ahead and reamed for the metaphyseal component.  We then placed the 12 stem epi-2 right set on the 0 setting, placed in 10 degrees retroversion, and placed that into the proximal humerus.  We went ahead and removed that, subluxed the humerus posteriorly, did a painstaking exposure of the glenoid face, which the entire thing was encased in scar, again bone-on-bone.  No signs of any good cartilage.  We did remove the supraspinatus, infraspinatus, and a little bit of the teres in the back, tried to leave some teres attached.  I am not sure whether any really survived the surgery, but really the releases that were done were mandatory to be able to get the shoulder balanced and stable.  Once we had the glenoid exposure, we drilled a central guide pin, centered low on the glenoid face.  We then reamed for the metaglene  and drilled our central peg hole and impacted the real metaglene in position and placed a 48 screw inferiorly, a 36 screw at the base of the coracoid, a 24 locked anteriorly, and then an 18 nonlocked posteriorly.  With excellent security with our base plate, we then took a 42 eccentric glenosphere, dialing the eccentricity posteroinferiorly to give [place] best coverage over the bone.  We had very good bone coverage.  I made sure that the axillary nerve was free and clear.  We then went ahead and extended the shoulder, irrigated the humeral canal.  We lost a little bit of bone in the back and I am not sure whether that was through our initial exposure.  In trying to get external rotation, I might have pulled off a little bit with the teres, but we were missing little of bone posterolaterally.  So, I retrialed with the 12 stem and the epi-2 right and I felt like there just was not enough bony stability there.  I  could still rotate a little bit with the prosthesis and I could pull it out easily, so I did go ahead and decide to do a hybrid fixation with HA coated stem designed for press-fit, but we put a little cement and so we vacuum mixed DePuy HV cement after thorough irrigation and drying the plate just a little bit in and around the metaphyseal area.  I then pushed our stem down our HA coated stem and impacted that.  We still had good HA to bone interface proximally and hopefully that will heal in. Once we had that hardened and again 10 degrees of retroversion, we went ahead and reduced the shoulder with a 42 +3 trial poly and the shoulder did reduce well and we did some additional releasing with my finger and Bovie to carefully protect the axillary nerve and made sure that we had good excursion and we also had good stability.  I wanted to make sure there was no soft tissue impingement or anything that would force this patient out.  We then retrieved the trial 42 +3 poly, and then  inserted the real 42 +3 poly into position and impacted that in place, reduced the shoulder with a nice little pop.  The axillary nerve was tight, but not under too much tension.  We then went ahead and irrigated thoroughly.  Repaired the deltopectoral interval with 0 Vicryl suture followed by 2-0 Vicryl for subcutaneous closure and 4-0 Monocryl for skin.  Steri-Strips applied followed by sterile dressing.  The patient tolerated the surgery well.     Doran Heater. Veverly Fells, M.D.   ______________________________ Doran Heater. Veverly Fells, M.D.    SRN/MEDQ  D:  12/07/2016  T:  12/08/2016  Job:  [id number]

## 2016-12-08 NOTE — Progress Notes (Addendum)
   Subjective: 1 Day Post-Op Procedure(s) (LRB): REVERSE SHOULDER ARTHROPLASTY (Right)  Interscalene block still in effect Slight issue with hypotension yesterday, improved today Overall pt doing well Probable d/c later this afternoon Patient reports pain as none.  Objective:   VITALS:   Vitals:   12/08/16 0122 12/08/16 0457  BP: (!) 105/54 (!) 108/58  Pulse: 94 (!) 103  Resp: 20 20  Temp: 97.4 F (36.3 C) 97.7 F (36.5 C)    Right shoulder dressing and sling in place Sensation gradually returning distally No rashes or drainage  LABS  Recent Labs  12/07/16 1039 12/08/16 0518  HGB 10.6* 8.8*  HCT 33.1* 27.8*  WBC 8.7  --   PLT 313  --      Recent Labs  12/07/16 1039 12/08/16 0518  NA 140 138  K 5.3* 5.0  BUN 21* 19  CREATININE 1.05 1.09  GLUCOSE 117* 216*     Assessment/Plan: 1 Day Post-Op Procedure(s) (LRB): REVERSE SHOULDER ARTHROPLASTY (Right) Overall pt doing well Probable d/c later this afternoon Discussed continued exercises and follow up instructions F/u in 2 weeks Patient in agreement  I examined the patient at 6 pm and the patient has begun feeling more pain in the anterior shoulder and chest.  Will watch him and work on pain management overnight with plans to D/C tomorrow after therapy.  His neuro exam is intact except his axillary nerve which seems to still be non functional.  No active deltoid contraction and numbness in the deltoid distribution.  Will check that tomorrow as well  Merla Riches, MPAS, PA-C  12/08/2016, 9:45 AM

## 2016-12-08 NOTE — Care Management Note (Signed)
Case Management Note  Patient Details  Name: Christian Bailey MRN: [id number] Date of Birth: Nov 24, 1940  Subjective/Objective:   Right Reverse Shoulder Arthroplasty                  Action/Plan: Discharge Planning: Chart reviewed. No NCM needs identified.   PCP Prince Solian MD  Expected Discharge Date:                Expected Discharge Plan:  Home/Self Care  In-House Referral:  NA  Discharge planning Services  CM Consult  Post Acute Care Choice:  NA Choice offered to:  NA  DME Arranged:  N/A DME Agency:  NA  HH Arranged:  NA HH Agency:  NA  Status of Service:  Completed, signed off  If discussed at [place] of Stay Meetings, dates discussed:    Additional Comments:  Erenest Rasher, RN 12/08/2016, 4:04 PM

## 2016-12-08 NOTE — Progress Notes (Signed)
Occupational Therapy Treatment Patient Details Name: Christian Bailey MRN: [id number] DOB: 12-08-1940 Today's Date: 12/08/2016    History of present illness Pt is a 76 y/o male s/p R femoral above knee popliteal bypass with left common femoral artery cannulation. PMH including but not limited to HTN, sinus tachycardia, DM, hyperlipidemia, PAD    OT comments  Pt progressing towards established goals. Provided education and handout of precautions, exercises, and ADLs related to shoulder replacement. Wife present for education and verbalized understanding. Pt spinal block still in effect limited his ROM in his shoulder, elbow, and wrist. Feel pt should stay an additional night to let spinal block resolve before dc home. Will continue to follow acutely to increase pt safety and independence with ADLs and exercises.    Follow Up Recommendations  DC plan and follow up therapy as arranged by surgeon;Supervision/Assistance - 24 hour    Equipment Recommendations  None recommended by OT    Recommendations for Other Services      Precautions / Restrictions Precautions Precautions: Fall;Shoulder Type of Shoulder Precautions: active protocal; flex 0-90, abd 0-60, EXR 0-30. Shoulder Interventions: Shoulder sling/immobilizer;Off for dressing/bathing/exercises (Sling for comfort and sleeping) Precaution Booklet Issued: Yes (comment) Precaution Comments: Reviewed precautions and ADLs. Will review again with wife present Required Braces or Orthoses: Sling Restrictions Weight Bearing Restrictions: Yes RUE Weight Bearing: Non weight bearing       Mobility Bed Mobility Overal bed mobility: Needs Assistance Bed Mobility: Rolling;Sidelying to Sit Rolling: Min guard Sidelying to sit: Min guard       General bed mobility comments: Pt used bedrail to help roll and push up into sitting at EOB. Pt required no physical A  Transfers Overall transfer level: Needs assistance Equipment used:  None Transfers: Sit to/from Stand Sit to Stand: Min guard         General transfer comment: Min guard for safety    Balance Overall balance assessment: No apparent balance deficits (not formally assessed)                                         ADL either performed or assessed with clinical judgement   ADL Overall ADL's : Needs assistance/impaired         Upper Body Bathing: Moderate assistance;Sitting;Cueing for compensatory techniques;Cueing for UE precautions;With caregiver independent assisting Upper Body Bathing Details (indicate cue type and reason): Educated pt and wife on bathing techniques and safety     Upper Body Dressing : Maximal assistance;Cueing for compensatory techniques;Cueing for UE precautions;With caregiver independent assisting;Sitting Upper Body Dressing Details (indicate cue type and reason): Educated pt and wife on donning shirt (t-shirt and button up)                 Functional mobility during ADLs: Min guard General ADL Comments: Pt wife arrivaed to recieved family education for carry over of prcautions, excercises, and ADLs     Vision       Perception     Praxis      Cognition Arousal/Alertness: Awake/alert Behavior During Therapy: WFL for tasks assessed/performed Overall Cognitive Status: Within Functional Limits for tasks assessed                                          Exercises Exercises: Shoulder Shoulder Exercises Shoulder  Flexion:  (Educated wife and pt. Unable to perform due to spinal block) Shoulder ABduction: AAROM;Right;5 reps;Seated Shoulder External Rotation: AAROM;Right;5 reps;Seated Elbow Flexion: AROM;10 reps;Right;Seated (Pt with decreased control due to spinal block) Elbow Extension: AROM;Right;10 reps;Seated (Pt with decreased control due to spinal block) Wrist Flexion:  (Educated wife and pt. Unable to perform due to spinal block) Wrist Extension:  (Educated wife and pt.  Unable to perform due to spinal block) Digit Composite Flexion: AROM;Right;10 reps;Seated Composite Extension: AROM;Right;10 reps;Seated Neck Flexion: Other (comment);AROM;5 reps;Seated (Pt with limited neck ROM PT) Neck Extension: AROM;5 reps;Seated;Other (comment) (Pt with limited neck ROM PT) Neck Lateral Flexion - Right: AROM;5 reps;Seated (Pt with limited neck ROM PT) Neck Lateral Flexion - Left: AROM;5 reps;Seated (Pt with limited neck ROM PT)   Shoulder Instructions Shoulder Instructions Donning/doffing shirt without moving shoulder: Maximal assistance;Caregiver independent with task Method for sponge bathing under operated UE: Moderate assistance;Caregiver independent with task Donning/doffing sling/immobilizer: Maximal assistance;Caregiver independent with task (Wife requires Min A and VCs to don sling) Correct positioning of sling/immobilizer: Caregiver independent with task;Patient able to independently direct caregiver Pendulum exercises (written home exercise program):  (NA) ROM for elbow, wrist and digits of operated UE: Caregiver independent with task;Patient able to independently direct caregiver (Pt with limited elbow and wrist ROM due to spinal block) Sling wearing schedule (on at all times/off for ADL's): Caregiver independent with task;Patient able to independently direct caregiver Proper positioning of operated UE when showering: Caregiver independent with task;Patient able to independently direct caregiver Positioning of UE while sleeping: Caregiver independent with task;Patient able to independently direct caregiver     General Comments Wife present for education. Provided hand outs on ADLs, precautions, and excercises    Pertinent Vitals/ Pain       Pain Assessment: No/denies pain  Home Living Family/patient expects to be discharged to:: Private residence Living Arrangements: Spouse/significant other Available Help at Discharge: Family;Available 24 hours/day Type  of Home: House Home Access: Stairs to enter [employer] of Steps: 4 Entrance Stairs-Rails: Left Home Layout: One level     Bathroom Shower/Tub: Occupational psychologist: Standard     Home Equipment: Shower seat - built in;Grab bars - tub/shower;Adaptive equipment Adaptive Equipment: Sock aid Additional Comments: has sock aid he can't use       Prior Functioning/Environment Level of Independence: Independent  Gait / Transfers Assistance Needed: independent - denies h/o falls  ADL's / Homemaking Assistance Needed: Pt reports difficulty with LB ADLs due limited hip and knee ROM as well as limited shoulder ROM.  He reports son bought him a sock aid that he has been unable to use        Frequency  Min 2X/week        Progress Toward Goals  OT Goals(current goals can now be found in the care plan section)  Progress towards OT goals: Progressing toward goals  Acute Rehab OT Goals Patient Stated Goal: Go home OT Goal Formulation: With patient Time For Goal Achievement: 12/22/16 Potential to Achieve Goals: Good ADL Goals Pt Will Perform Upper Body Bathing: with set-up;with supervision;sitting;with caregiver independent in assisting Pt Will Perform Upper Body Dressing: with set-up;with supervision;with caregiver independent in assisting;sitting Pt/caregiver will Perform Home Exercise Program: Right Upper extremity;With written HEP provided;With minimal assist  Plan Discharge plan remains appropriate    Co-evaluation                 AM-PAC PT "6 Clicks" Daily Activity  Outcome Measure   Help from another person eating meals?: None Help from another person taking care of personal grooming?: A Little Help from another person toileting, which includes using toliet, bedpan, or urinal?: A Little Help from another person bathing (including washing, rinsing, drying)?: A Little Help from another person to put on and taking off regular upper body  clothing?: A Lot Help from another person to put on and taking off regular lower body clothing?: A Little 6 Click Score: 18    End of Session Equipment Utilized During Treatment: Gait belt;Other (comment) (Sling)  OT Visit Diagnosis: Other abnormalities of gait and mobility (R26.89);Pain;Unsteadiness on feet (R26.81) Pain - Right/Left: Right Pain - part of body: Shoulder   Activity Tolerance Patient tolerated treatment well (Limited by spinal block)   Patient Left in chair;with call bell/phone within reach   Nurse Communication Mobility status;Weight bearing status;Precautions;Other (comment) (Spinal block)        Time: 0761-5183 OT Time Calculation (min): 31 min  Charges: OT General Charges $OT Visit: 1 Procedure OT Treatments $Self Care/Home Management : 23-37 mins  [place], OTR/L Acute Rehab Pager: [phone] Office: [place] 12/08/2016, 3:13 PM

## 2016-12-08 NOTE — Evaluation (Signed)
Occupational Therapy Evaluation Patient Details Name: Christian Bailey MRN: [id number] DOB: June 16, 1941 Today's Date: 12/08/2016    History of Present Illness Pt is a 76 y/o male s/p R femoral above knee popliteal bypass with left common femoral artery cannulation. PMH including but not limited to HTN, sinus tachycardia, DM, hyperlipidemia, PAD    Clinical Impression   PTA, pt was living with his wife and was independent. Currently, pt requires Mod-Max A for UB ADLs and Min guard for  functional mobility. Pt demonstrating safe balance while performing grooming at sink with Min guard or safety. Pt required Min VCs to sequence tasks using one handed compensatory techniques. Provided education on UB ADLs, bed mobility, sleeping positioning, and donning/doffing sling. At session, pt spinal block still present, so will return later today when wife present to go over handouts, shoulder exercises, and ADLs.  Recommend dc home with initial 24 hour supervision pending pt progress at second session.       Follow Up Recommendations  DC plan and follow up therapy as arranged by surgeon;Supervision/Assistance - 24 hour    Equipment Recommendations  None recommended by OT    Recommendations for Other Services       Precautions / Restrictions Precautions Precautions: Fall;Shoulder Type of Shoulder Precautions: active protocal; flex 0-90, abd 0-60, EXR 0-30. Shoulder Interventions: Shoulder sling/immobilizer;Off for dressing/bathing/exercises (Sling for comfort and sleeping) Precaution Booklet Issued: Yes (comment) Precaution Comments: Reviewed precautions and ADLs. Will review again with wife present Required Braces or Orthoses: Sling Restrictions Weight Bearing Restrictions: Yes RUE Weight Bearing: Non weight bearing      Mobility Bed Mobility Overal bed mobility: Needs Assistance Bed Mobility: Rolling;Sidelying to Sit Rolling: Min assist Sidelying to sit: Min assist       General bed  mobility comments: Min A to protect R shoulder and inital push up from sidelying to sitting. VC for sequencing  Transfers Overall transfer level: Needs assistance Equipment used: None Transfers: Sit to/from Stand Sit to Stand: Min guard         General transfer comment: Min guard for safety    Balance Overall balance assessment: No apparent balance deficits (not formally assessed)                                         ADL either performed or assessed with clinical judgement   ADL Overall ADL's : Needs assistance/impaired Eating/Feeding: Set up;Sitting   Grooming: Oral care;Min guard;Standing;Cueing for sequencing   Upper Body Bathing: Moderate assistance;Sitting   Lower Body Bathing: Moderate assistance;Sit to/from stand   Upper Body Dressing : Maximal assistance;Standing Upper Body Dressing Details (indicate cue type and reason): Max A to don new gown and sling Lower Body Dressing: Moderate assistance;Sit to/from stand   Toilet Transfer: Min guard;Ambulation (Simulated to chair)           Functional mobility during ADLs: Min guard General ADL Comments: Pt demonstrating decreased fucntional performance, but progressing well after surgery. Currently, spinal block in place inhibiting AROM. Pt plans for wife to come ~1pm. Will return to educate wife and review with pt ADLs, excercises, and precautions     Vision         Perception     Praxis      Pertinent Vitals/Pain Pain Assessment: No/denies pain (Spinal block)     Hand Dominance Right   Extremity/Trunk Assessment Upper Extremity Assessment Upper Extremity Assessment:  RUE deficits/detail RUE Deficits / Details: s/p shoulder replacement. Spinal block still in place.  RUE: Unable to fully assess due to immobilization (Spinal block limiting AROM) RUE Sensation:  (intact. pt reports feeling numb) RUE Coordination: decreased fine motor;decreased gross motor   Lower Extremity  Assessment Lower Extremity Assessment: Overall WFL for tasks assessed   Cervical / Trunk Assessment Cervical / Trunk Assessment: Normal   Communication Communication Communication: No difficulties   Cognition Arousal/Alertness: Awake/alert Behavior During Therapy: WFL for tasks assessed/performed Overall Cognitive Status: Within Functional Limits for tasks assessed                                     General Comments  Spinal block still present    Exercises Exercises: Shoulder   Shoulder Instructions Shoulder Instructions Donning/doffing shirt without moving shoulder: Maximal assistance Method for sponge bathing under operated UE: Moderate assistance Donning/doffing sling/immobilizer: Maximal assistance Correct positioning of sling/immobilizer: Moderate assistance Positioning of UE while sleeping: Moderate assistance    Home Living Family/patient expects to be discharged to:: Private residence Living Arrangements: Spouse/significant other Available Help at Discharge: Family;Available 24 hours/day Type of Home: House Home Access: Stairs to enter [employer] of Steps: 4 Entrance Stairs-Rails: Left Home Layout: One level     Bathroom Shower/Tub: Occupational psychologist: Standard     Home Equipment: Shower seat - built in;Grab bars - tub/shower;Adaptive equipment Adaptive Equipment: Sock aid        Prior Functioning/Environment Level of Independence: Independent                 OT Problem List: Decreased strength;Decreased range of motion;Decreased activity tolerance;Impaired balance (sitting and/or standing);Decreased safety awareness;Decreased knowledge of use of DME or AE;Decreased knowledge of precautions;Pain;Impaired UE functional use      OT Treatment/Interventions: Self-care/ADL training;Therapeutic exercise;Energy conservation;DME and/or AE instruction;Therapeutic activities;Patient/family education    OT  Goals(Current goals can be found in the care plan section) Acute Rehab OT Goals Patient Stated Goal: Go home OT Goal Formulation: With patient Time For Goal Achievement: 12/22/16 Potential to Achieve Goals: Good ADL Goals Pt Will Perform Upper Body Bathing: with set-up;with supervision;sitting;with caregiver independent in assisting Pt Will Perform Upper Body Dressing: with set-up;with supervision;with caregiver independent in assisting;sitting Pt/caregiver will Perform Home Exercise Program: Right Upper extremity;With written HEP provided;With minimal assist  OT Frequency: Min 2X/week   Barriers to D/C:            Co-evaluation              AM-PAC PT "6 Clicks" Daily Activity     Outcome Measure Help from another person eating meals?: None Help from another person taking care of personal grooming?: A Little Help from another person toileting, which includes using toliet, bedpan, or urinal?: A Little Help from another person bathing (including washing, rinsing, drying)?: A Little Help from another person to put on and taking off regular upper body clothing?: A Lot Help from another person to put on and taking off regular lower body clothing?: A Little 6 Click Score: 18   End of Session Equipment Utilized During Treatment: Gait belt;Other (comment) (Sling) Nurse Communication: Mobility status;Weight bearing status;Precautions;Other (comment) (Spinal block)  Activity Tolerance: Patient tolerated treatment well (Limited by spinal block) Patient left: in chair;with call bell/phone within reach  OT Visit Diagnosis: Other abnormalities of gait and mobility (R26.89);Pain;Unsteadiness on feet (R26.81) Pain - Right/Left: Right  Pain - part of body: Shoulder                Time: 7741-2878 OT Time Calculation (min): 31 min Charges:  OT General Charges $OT Visit: 1 Procedure OT Evaluation $OT Eval Low Complexity: 1 Procedure OT Treatments $Self Care/Home Management : 8-22  mins G-Codes:     Amine Adelson MSOT, OTR/L Acute Rehab Pager: [phone] Office: [place] 12/08/2016, 9:37 AM

## 2016-12-08 NOTE — Progress Notes (Signed)
Patient saline locked and taken off continuous pulse ox per MD order.

## 2016-12-09 LAB — GLUCOSE, CAPILLARY
GLUCOSE-CAPILLARY: 218 mg/dL — AB (ref 65–99)
Glucose-Capillary: 190 mg/dL — ABNORMAL HIGH (ref 65–99)

## 2016-12-09 MED ORDER — FERROUS SULFATE 325 (65 FE) MG PO TABS
325.0000 mg | ORAL_TABLET | Freq: Two times a day (BID) | ORAL | Status: DC
  Administered 2016-12-09: 325 mg via ORAL
  Filled 2016-12-09: qty 1

## 2016-12-09 MED ORDER — INSULIN ASPART 100 UNIT/ML ~~LOC~~ SOLN: 4.0000 [IU] | Freq: Three times a day (TID) | SUBCUTANEOUS | Status: DC

## 2016-12-09 MED ORDER — FERROUS SULFATE 325 (65 FE) MG PO TBEC: 325.0000 mg | DELAYED_RELEASE_TABLET | Freq: Two times a day (BID) | ORAL | 0 refills | Status: DC

## 2016-12-09 MED ORDER — INSULIN ASPART 100 UNIT/ML ~~LOC~~ SOLN
0.0000 [IU] | Freq: Three times a day (TID) | SUBCUTANEOUS | Status: DC
  Administered 2016-12-09: 5 [IU] via SUBCUTANEOUS
  Administered 2016-12-09: 3 [IU] via SUBCUTANEOUS

## 2016-12-09 MED ORDER — INSULIN ASPART 100 UNIT/ML ~~LOC~~ SOLN: 0.0000 [IU] | Freq: Every day | SUBCUTANEOUS | Status: DC

## 2016-12-09 NOTE — Progress Notes (Signed)
Occupational Therapy Treatment Patient Details Name: Christian Bailey MRN: [id number] DOB: [birth year] Today's Date: 12/09/2016    History of present illness Pt is a 76 y/o male s/p R femoral above knee popliteal bypass with left common femoral artery cannulation. PMH including but not limited to HTN, sinus tachycardia, DM, hyperlipidemia, PAD    OT comments  Pt progressing towards established goals. Pt performed shoulder exercises with Min A for shoulder flexion and abduction. Wife demonstrated understanding of exercises, precautions, and ADLs. Answered all questions. Continue to recommend dc home once medically stable per physician.     Follow Up Recommendations  DC plan and follow up therapy as arranged by surgeon;Supervision/Assistance - 24 hour    Equipment Recommendations  None recommended by OT    Recommendations for Other Services      Precautions / Restrictions Precautions Precautions: Fall;Shoulder Type of Shoulder Precautions: active protocal; flex 0-90, abd 0-60, EXR 0-30. Shoulder Interventions: Shoulder sling/immobilizer;Off for dressing/bathing/exercises (Sling for comfort and sleeping) Precaution Booklet Issued: Yes (comment) Precaution Comments: Reviewed precautions and ADLs. Will review again with wife present Required Braces or Orthoses: Sling Restrictions Weight Bearing Restrictions: Yes RUE Weight Bearing: Non weight bearing       Mobility Bed Mobility Overal bed mobility: Needs Assistance Bed Mobility: Rolling;Sidelying to Sit Rolling: Min guard Sidelying to sit: Supervision       General bed mobility comments: no physical A needed  Transfers Overall transfer level: Needs assistance Equipment used: None Transfers: Sit to/from Stand Sit to Stand: Supervision         General transfer comment: supervision for safety    Balance Overall balance assessment: No apparent balance deficits (not formally assessed)                                          ADL either performed or assessed with clinical judgement   ADL Overall ADL's : Needs assistance/impaired                 Upper Body Dressing : With caregiver independent assisting;Sitting;Moderate assistance Upper Body Dressing Details (indicate cue type and reason): Pt and wife demosntrated good understanding of donning UB clothes and sling. VCs for sling positioning - pt able to verbalize to wife correct position Lower Body Dressing: Moderate assistance;Sit to/from stand;Cueing for safety              Functional mobility during ADLs: Min guard General ADL Comments: Prepared pt and wife to dc today. Reviewed all education, precautions, and excercises     Vision       Perception     Praxis      Cognition Arousal/Alertness: Awake/alert Behavior During Therapy: WFL for tasks assessed/performed Overall Cognitive Status: Within Functional Limits for tasks assessed                                          Exercises Exercises: Shoulder Shoulder Exercises Shoulder Flexion: PROM;10 reps;Right;Seated (Wife education on how to perform PROM since pt with limited AROM in shoulder) Shoulder ABduction: AAROM;Right;5 reps;Seated Shoulder External Rotation: AAROM;Right;5 reps;Seated Elbow Flexion:  (Pt with decreased control due to spinal block) Elbow Extension: AROM;Right;10 reps;Seated Wrist Flexion: AROM;Right;5 reps;Seated Wrist Extension: AROM;Right;10 reps;Seated Neck Flexion: Other (comment) (Pt with limited neck ROM PT) Neck Extension: Other (comment) (Pt  with limited neck ROM PT) Neck Lateral Flexion - Right: Other (comment) (Pt with limited neck ROM PT) Neck Lateral Flexion - Left: Other (comment) (Pt with limited neck ROM PT)   Shoulder Instructions Shoulder Instructions Donning/doffing shirt without moving shoulder: Maximal assistance;Caregiver independent with task;Patient able to independently direct caregiver Method for  sponge bathing under operated UE: Caregiver independent with task;Patient able to independently direct caregiver Donning/doffing sling/immobilizer: Maximal assistance;Caregiver independent with task;Patient able to independently direct caregiver Correct positioning of sling/immobilizer: Caregiver independent with task;Patient able to independently direct caregiver (Pt able to verbalize to wife correct positioning) Pendulum exercises (written home exercise program):  (NA) ROM for elbow, wrist and digits of operated UE: Caregiver independent with task;Patient able to independently direct caregiver (Pt with decreased ROM in supination and pronation; WFL for elbow, wrist, and composite grasp) Sling wearing schedule (on at all times/off for ADL's): Caregiver independent with task;Patient able to independently direct caregiver Proper positioning of operated UE when showering: Caregiver independent with task;Patient able to independently direct caregiver Positioning of UE while sleeping: Caregiver independent with task;Patient able to independently direct caregiver     General Comments Wife present for session    Pertinent Vitals/ Pain       Pain Assessment: Faces Faces Pain Scale: Hurts a little bit Pain Location: R shoulder; with movement Pain Descriptors / Indicators: Grimacing;Guarding;Discomfort Pain Intervention(s): Monitored during session;Repositioned;Ice applied  Home Living                                          Prior Functioning/Environment              Frequency  Min 2X/week        Progress Toward Goals  OT Goals(current goals can now be found in the care plan section)  Progress towards OT goals: Progressing toward goals  Acute Rehab OT Goals Patient Stated Goal: Go home OT Goal Formulation: With patient Time For Goal Achievement: 12/22/16 Potential to Achieve Goals: Good ADL Goals Pt Will Perform Upper Body Bathing: with set-up;with  supervision;sitting;with caregiver independent in assisting Pt Will Perform Upper Body Dressing: with set-up;with supervision;with caregiver independent in assisting;sitting Pt/caregiver will Perform Home Exercise Program: Right Upper extremity;With written HEP provided;With minimal assist  Plan Discharge plan remains appropriate    Co-evaluation                 AM-PAC PT "6 Clicks" Daily Activity     Outcome Measure   Help from another person eating meals?: None Help from another person taking care of personal grooming?: A Little Help from another person toileting, which includes using toliet, bedpan, or urinal?: A Little Help from another person bathing (including washing, rinsing, drying)?: A Little Help from another person to put on and taking off regular upper body clothing?: A Little Help from another person to put on and taking off regular lower body clothing?: A Little 6 Click Score: 19    End of Session Equipment Utilized During Treatment: Gait belt;Other (comment) (Sling)  OT Visit Diagnosis: Other abnormalities of gait and mobility (R26.89);Pain;Unsteadiness on feet (R26.81) Pain - Right/Left: Right Pain - part of body: Shoulder   Activity Tolerance Patient tolerated treatment well (Limited by spinal block)   Patient Left in chair;with call bell/phone within reach   Nurse Communication Mobility status;Weight bearing status;Precautions;Other (comment) (Spinal block)        Time:  5732-2025 OT Time Calculation (min): 17 min  Charges: OT General Charges $OT Visit: 1 Procedure OT Treatments $Self Care/Home Management : 8-22 mins  [place], OTR/L Acute Rehab Pager: [phone] Office: [place] 12/09/2016, 3:20 PM

## 2016-12-09 NOTE — Progress Notes (Signed)
Reviewed AVS with patient.  Patient is waiting for his wife to arrive to work with OT with him. Patient is stable for discharge.

## 2016-12-09 NOTE — Discharge Summary (Signed)
Physician Discharge Summary   Patient ID: Christian Bailey MRN: [id number] DOB/AGE: 1941/05/23 76 y.o.  Admit date: 12/07/2016 Discharge date: 12/09/2016  Admission Diagnoses:  Active Problems:   S/P shoulder replacement, right   Discharge Diagnoses:  Same   Surgeries: Procedure(s): REVERSE SHOULDER ARTHROPLASTY on 12/07/2016   Consultants: PT/OT  Discharged Condition: Stable  Hospital Course: Christian Bailey is an 76 y.o. male who was admitted 12/07/2016 with a chief complaint of right shoulder pain, and found to have a diagnosis of right shoulder cuff arthropathy.  They were brought to the operating room on 12/07/2016 and underwent the above named procedures.    The patient had an uncomplicated hospital course and was stable for discharge.  Recent vital signs:  Vitals:   12/09/16 0530 12/09/16 0805  BP: (!) 130/55 (!) 145/54  Pulse: (!) 108 (!) 104  Resp: 18   Temp: 97.5 F (36.4 C)     Recent laboratory studies:  Results for orders placed or performed during the hospital encounter of 12/07/16  Glucose, capillary  Result Value Ref Range   Glucose-Capillary 118 (H) 65 - 99 mg/dL   Comment 1 Notify RN    Comment 2 Document in Chart   CBC  Result Value Ref Range   WBC 8.7 4.0 - 10.5 K/uL   RBC 3.63 (L) 4.22 - 5.81 MIL/uL   Hemoglobin 10.6 (L) 13.0 - 17.0 g/dL   HCT 33.1 (L) 39.0 - 52.0 %   MCV 91.2 78.0 - 100.0 fL   MCH 29.2 26.0 - 34.0 pg   MCHC 32.0 30.0 - 36.0 g/dL   RDW 13.5 11.5 - 15.5 %   Platelets 313 150 - 400 K/uL  Basic metabolic panel  Result Value Ref Range   Sodium 140 135 - 145 mmol/L   Potassium 5.3 (H) 3.5 - 5.1 mmol/L   Chloride 106 101 - 111 mmol/L   CO2 27 22 - 32 mmol/L   Glucose, Bld 117 (H) 65 - 99 mg/dL   BUN 21 (H) 6 - 20 mg/dL   Creatinine, Ser 1.05 0.61 - 1.24 mg/dL   Calcium 9.0 8.9 - 10.3 mg/dL   GFR calc non Af Amer >60 >60 mL/min   GFR calc Af Amer >60 >60 mL/min   Anion gap 7 5 - 15  Glucose, capillary  Result Value Ref Range   Glucose-Capillary 102 (H) 65 - 99 mg/dL  Glucose, capillary  Result Value Ref Range   Glucose-Capillary 166 (H) 65 - 99 mg/dL   Comment 1 Notify RN   Hemoglobin and hematocrit, blood  Result Value Ref Range   Hemoglobin 8.8 (L) 13.0 - 17.0 g/dL   HCT 27.8 (L) 39.0 - 15.1 %  Basic metabolic panel  Result Value Ref Range   Sodium 138 135 - 145 mmol/L   Potassium 5.0 3.5 - 5.1 mmol/L   Chloride 101 101 - 111 mmol/L   CO2 29 22 - 32 mmol/L   Glucose, Bld 216 (H) 65 - 99 mg/dL   BUN 19 6 - 20 mg/dL   Creatinine, Ser 1.09 0.61 - 1.24 mg/dL   Calcium 8.4 (L) 8.9 - 10.3 mg/dL   GFR calc non Af Amer >60 >60 mL/min   GFR calc Af Amer >60 >60 mL/min   Anion gap 8 5 - 15  Glucose, capillary  Result Value Ref Range   Glucose-Capillary 201 (H) 65 - 99 mg/dL  Glucose, capillary  Result Value Ref Range   Glucose-Capillary 217 (H) 65 - 99  mg/dL  Glucose, capillary  Result Value Ref Range   Glucose-Capillary 218 (H) 65 - 99 mg/dL    Discharge Medications:   Allergies as of 12/09/2016      Reactions   Asa [aspirin] Other (See Comments)   GI bleeding   Oxycodone-acetaminophen Other (See Comments)   Dizziness, uncomfortable       Medication List    TAKE these medications   clopidogrel 75 MG tablet Commonly known as:  PLAVIX Take 1 tablet (75 mg total) by mouth daily after breakfast.   COQ10 PO Take 1 tablet by mouth daily.   Fish Oil 1200 MG Caps Take 1,200 mg by mouth daily.   glucosamine-chondroitin 500-400 MG tablet Take 1 tablet by mouth 2 (two) times daily.   HYDROcodone-acetaminophen 5-325 MG tablet Commonly known as:  NORCO Take 1 tablet by mouth every 6 (six) hours as needed for moderate pain.   lisinopril 5 MG tablet Commonly known as:  PRINIVIL,ZESTRIL Take 5 mg by mouth 2 (two) times daily.   metFORMIN 1000 MG tablet Commonly known as:  GLUCOPHAGE Take 1 tablet (1,000 mg total) by mouth 2 (two) times daily with a meal.   methocarbamol 500 MG tablet Commonly  known as:  ROBAXIN Take 1 tablet (500 mg total) by mouth 3 (three) times daily as needed.   metoprolol succinate 25 MG 24 hr tablet Commonly known as:  TOPROL-XL Take 25 mg by mouth 2 (two) times daily.   pantoprazole 40 MG tablet Commonly known as:  PROTONIX Take 2 tablets (80 mg total) by mouth daily.   rosuvastatin 20 MG tablet Commonly known as:  CRESTOR Take 20 mg by mouth every evening.       Diagnostic Studies: Dg Shoulder Right Port  Result Date: 12/07/2016 CLINICAL DATA:  Post right shoulder replacement. EXAM: PORTABLE RIGHT SHOULDER COMPARISON:  Chest x-ray 04/13/2016 FINDINGS: Mild degenerate change of the [facility] joint. Right shoulder arthroplasty intact and normally located. Remaining bones and soft tissues are within normal. IMPRESSION: Right shoulder arthroplasty intact. Electronically Signed   By: Marin Olp M.D.   On: 12/07/2016 17:50    Disposition: 01-Home or Self Care  Discharge Instructions    Call MD / Call 911    Complete by:  As directed    If you experience chest pain or shortness of breath, CALL 911 and be transported to the hospital emergency room.  If you develope a fever above 101 F, pus (white drainage) or increased drainage or redness at the wound, or calf pain, call your surgeon's office.   Constipation Prevention    Complete by:  As directed    Drink plenty of fluids.  Prune juice may be helpful.  You may use a stool softener, such as Colace (over the counter) 100 mg twice a day.  Use MiraLax (over the counter) for constipation as needed.   Diet - low sodium heart healthy    Complete by:  As directed    Increase activity slowly as tolerated    Complete by:  As directed       Follow-up Information    Netta Cedars, MD. Call in 2 weeks.   Specialty:  Orthopedic Surgery Why:  [phone] Contact information: [address] [place] [address] [phone]            Signed: Ventura Bruns 12/09/2016, 11:40 AM

## 2016-12-09 NOTE — Progress Notes (Addendum)
Orthopedics Progress Note  Subjective: Patient nauseated this morning  Objective:  Vitals:   12/08/16 2016 12/09/16 0530  BP: (!) 107/49 (!) 130/55  Pulse: (!) 128 (!) 108  Resp: 18 18  Temp: 98.6 F (37 C) 97.5 F (36.4 C)    General: Awake and alert  Musculoskeletal: right shoulder dressing changed, incision looks great today, Aquacel applied Neurovascularly intact  Lab Results  Component Value Date   WBC 8.7 12/07/2016   HGB 8.8 (L) 12/08/2016   HCT 27.8 (L) 12/08/2016   MCV 91.2 12/07/2016   PLT 313 12/07/2016       Component Value Date/Time   NA 138 12/08/2016 0518   K 5.0 12/08/2016 0518   CL 101 12/08/2016 0518   CO2 29 12/08/2016 0518   GLUCOSE 216 (H) 12/08/2016 0518   BUN 19 12/08/2016 0518   CREATININE 1.09 12/08/2016 0518   CREATININE 1.33 (H) 04/10/2016 0851   CALCIUM 8.4 (L) 12/08/2016 0518   GFRNONAA >60 12/08/2016 0518   GFRAA >60 12/08/2016 0518    Lab Results  Component Value Date   INR 0.99 09/11/2016   INR 1.0 04/10/2016   INR 1.01 05/08/2014    Assessment/Plan: POD #2 s/p Procedure(s): REVERSE SHOULDER ARTHROPLASTY Will treat for nausea this morning and start sliding scale as sugars up.  I feel the most likely cause of nausea was the IV pain medicine, will d/c OT, possible discharge later today Acute blood loss anemia superimposed on some chronic anemia, starting Hgb around 10 FeSo4 starting today  Remo Lipps R. Veverly Fells, MD 12/09/2016 7:40 AM

## 2016-12-10 ENCOUNTER — Other Ambulatory Visit (HOSPITAL_COMMUNITY): Payer: Medicare Other

## 2016-12-16 DIAGNOSIS — Z471 Aftercare following joint replacement surgery: Secondary | ICD-10-CM | POA: Diagnosis not present

## 2016-12-16 DIAGNOSIS — I10 Essential (primary) hypertension: Secondary | ICD-10-CM | POA: Diagnosis not present

## 2016-12-16 DIAGNOSIS — E1151 Type 2 diabetes mellitus with diabetic peripheral angiopathy without gangrene: Secondary | ICD-10-CM | POA: Diagnosis not present

## 2016-12-16 DIAGNOSIS — Z96611 Presence of right artificial shoulder joint: Secondary | ICD-10-CM | POA: Diagnosis not present

## 2016-12-16 DIAGNOSIS — M6281 Muscle weakness (generalized): Secondary | ICD-10-CM | POA: Diagnosis not present

## 2016-12-16 DIAGNOSIS — I2510 Atherosclerotic heart disease of native coronary artery without angina pectoris: Secondary | ICD-10-CM | POA: Diagnosis not present

## 2016-12-17 DIAGNOSIS — E1151 Type 2 diabetes mellitus with diabetic peripheral angiopathy without gangrene: Secondary | ICD-10-CM | POA: Diagnosis not present

## 2016-12-17 DIAGNOSIS — M6281 Muscle weakness (generalized): Secondary | ICD-10-CM | POA: Diagnosis not present

## 2016-12-17 DIAGNOSIS — Z471 Aftercare following joint replacement surgery: Secondary | ICD-10-CM | POA: Diagnosis not present

## 2016-12-17 DIAGNOSIS — I2510 Atherosclerotic heart disease of native coronary artery without angina pectoris: Secondary | ICD-10-CM | POA: Diagnosis not present

## 2016-12-17 DIAGNOSIS — I10 Essential (primary) hypertension: Secondary | ICD-10-CM | POA: Diagnosis not present

## 2016-12-17 DIAGNOSIS — Z96611 Presence of right artificial shoulder joint: Secondary | ICD-10-CM | POA: Diagnosis not present

## 2016-12-18 DIAGNOSIS — H353124 Nonexudative age-related macular degeneration, left eye, advanced atrophic with subfoveal involvement: Secondary | ICD-10-CM | POA: Diagnosis not present

## 2016-12-18 DIAGNOSIS — H353212 Exudative age-related macular degeneration, right eye, with inactive choroidal neovascularization: Secondary | ICD-10-CM | POA: Diagnosis not present

## 2016-12-18 DIAGNOSIS — H353113 Nonexudative age-related macular degeneration, right eye, advanced atrophic without subfoveal involvement: Secondary | ICD-10-CM | POA: Diagnosis not present

## 2016-12-18 DIAGNOSIS — E1151 Type 2 diabetes mellitus with diabetic peripheral angiopathy without gangrene: Secondary | ICD-10-CM | POA: Diagnosis not present

## 2016-12-18 DIAGNOSIS — I2510 Atherosclerotic heart disease of native coronary artery without angina pectoris: Secondary | ICD-10-CM | POA: Diagnosis not present

## 2016-12-18 DIAGNOSIS — M6281 Muscle weakness (generalized): Secondary | ICD-10-CM | POA: Diagnosis not present

## 2016-12-18 DIAGNOSIS — I10 Essential (primary) hypertension: Secondary | ICD-10-CM | POA: Diagnosis not present

## 2016-12-18 DIAGNOSIS — H353222 Exudative age-related macular degeneration, left eye, with inactive choroidal neovascularization: Secondary | ICD-10-CM | POA: Diagnosis not present

## 2016-12-18 DIAGNOSIS — Z471 Aftercare following joint replacement surgery: Secondary | ICD-10-CM | POA: Diagnosis not present

## 2016-12-18 DIAGNOSIS — Z96611 Presence of right artificial shoulder joint: Secondary | ICD-10-CM | POA: Diagnosis not present

## 2016-12-22 DIAGNOSIS — Z471 Aftercare following joint replacement surgery: Secondary | ICD-10-CM | POA: Diagnosis not present

## 2016-12-22 DIAGNOSIS — Z96611 Presence of right artificial shoulder joint: Secondary | ICD-10-CM | POA: Diagnosis not present

## 2016-12-23 DIAGNOSIS — Z96611 Presence of right artificial shoulder joint: Secondary | ICD-10-CM | POA: Diagnosis not present

## 2016-12-23 DIAGNOSIS — M6281 Muscle weakness (generalized): Secondary | ICD-10-CM | POA: Diagnosis not present

## 2016-12-23 DIAGNOSIS — Z471 Aftercare following joint replacement surgery: Secondary | ICD-10-CM | POA: Diagnosis not present

## 2016-12-23 DIAGNOSIS — I2510 Atherosclerotic heart disease of native coronary artery without angina pectoris: Secondary | ICD-10-CM | POA: Diagnosis not present

## 2016-12-23 DIAGNOSIS — I10 Essential (primary) hypertension: Secondary | ICD-10-CM | POA: Diagnosis not present

## 2016-12-23 DIAGNOSIS — E1151 Type 2 diabetes mellitus with diabetic peripheral angiopathy without gangrene: Secondary | ICD-10-CM | POA: Diagnosis not present

## 2016-12-24 DIAGNOSIS — Z96611 Presence of right artificial shoulder joint: Secondary | ICD-10-CM | POA: Diagnosis not present

## 2016-12-24 DIAGNOSIS — E1151 Type 2 diabetes mellitus with diabetic peripheral angiopathy without gangrene: Secondary | ICD-10-CM | POA: Diagnosis not present

## 2016-12-24 DIAGNOSIS — I2510 Atherosclerotic heart disease of native coronary artery without angina pectoris: Secondary | ICD-10-CM | POA: Diagnosis not present

## 2016-12-24 DIAGNOSIS — M6281 Muscle weakness (generalized): Secondary | ICD-10-CM | POA: Diagnosis not present

## 2016-12-24 DIAGNOSIS — Z471 Aftercare following joint replacement surgery: Secondary | ICD-10-CM | POA: Diagnosis not present

## 2016-12-24 DIAGNOSIS — I10 Essential (primary) hypertension: Secondary | ICD-10-CM | POA: Diagnosis not present

## 2016-12-25 DIAGNOSIS — D62 Acute posthemorrhagic anemia: Secondary | ICD-10-CM | POA: Diagnosis not present

## 2016-12-25 DIAGNOSIS — Z96611 Presence of right artificial shoulder joint: Secondary | ICD-10-CM | POA: Diagnosis not present

## 2016-12-25 DIAGNOSIS — M6281 Muscle weakness (generalized): Secondary | ICD-10-CM | POA: Diagnosis not present

## 2016-12-25 DIAGNOSIS — I2510 Atherosclerotic heart disease of native coronary artery without angina pectoris: Secondary | ICD-10-CM | POA: Diagnosis not present

## 2016-12-25 DIAGNOSIS — I10 Essential (primary) hypertension: Secondary | ICD-10-CM | POA: Diagnosis not present

## 2016-12-25 DIAGNOSIS — E1151 Type 2 diabetes mellitus with diabetic peripheral angiopathy without gangrene: Secondary | ICD-10-CM | POA: Diagnosis not present

## 2016-12-25 DIAGNOSIS — Z471 Aftercare following joint replacement surgery: Secondary | ICD-10-CM | POA: Diagnosis not present

## 2016-12-25 DIAGNOSIS — Z6824 Body mass index (BMI) 24.0-24.9, adult: Secondary | ICD-10-CM | POA: Diagnosis not present

## 2016-12-25 DIAGNOSIS — R531 Weakness: Secondary | ICD-10-CM | POA: Diagnosis not present

## 2016-12-28 DIAGNOSIS — I2510 Atherosclerotic heart disease of native coronary artery without angina pectoris: Secondary | ICD-10-CM | POA: Diagnosis not present

## 2016-12-28 DIAGNOSIS — I10 Essential (primary) hypertension: Secondary | ICD-10-CM | POA: Diagnosis not present

## 2016-12-28 DIAGNOSIS — Z471 Aftercare following joint replacement surgery: Secondary | ICD-10-CM | POA: Diagnosis not present

## 2016-12-28 DIAGNOSIS — M6281 Muscle weakness (generalized): Secondary | ICD-10-CM | POA: Diagnosis not present

## 2016-12-28 DIAGNOSIS — E1151 Type 2 diabetes mellitus with diabetic peripheral angiopathy without gangrene: Secondary | ICD-10-CM | POA: Diagnosis not present

## 2016-12-28 DIAGNOSIS — Z96611 Presence of right artificial shoulder joint: Secondary | ICD-10-CM | POA: Diagnosis not present

## 2016-12-31 DIAGNOSIS — Z471 Aftercare following joint replacement surgery: Secondary | ICD-10-CM | POA: Diagnosis not present

## 2016-12-31 DIAGNOSIS — Z96611 Presence of right artificial shoulder joint: Secondary | ICD-10-CM | POA: Diagnosis not present

## 2016-12-31 DIAGNOSIS — I10 Essential (primary) hypertension: Secondary | ICD-10-CM | POA: Diagnosis not present

## 2016-12-31 DIAGNOSIS — I2510 Atherosclerotic heart disease of native coronary artery without angina pectoris: Secondary | ICD-10-CM | POA: Diagnosis not present

## 2016-12-31 DIAGNOSIS — E1151 Type 2 diabetes mellitus with diabetic peripheral angiopathy without gangrene: Secondary | ICD-10-CM | POA: Diagnosis not present

## 2016-12-31 DIAGNOSIS — M6281 Muscle weakness (generalized): Secondary | ICD-10-CM | POA: Diagnosis not present

## 2017-01-01 DIAGNOSIS — I2510 Atherosclerotic heart disease of native coronary artery without angina pectoris: Secondary | ICD-10-CM | POA: Diagnosis not present

## 2017-01-01 DIAGNOSIS — Z471 Aftercare following joint replacement surgery: Secondary | ICD-10-CM | POA: Diagnosis not present

## 2017-01-01 DIAGNOSIS — Z96611 Presence of right artificial shoulder joint: Secondary | ICD-10-CM | POA: Diagnosis not present

## 2017-01-01 DIAGNOSIS — M6281 Muscle weakness (generalized): Secondary | ICD-10-CM | POA: Diagnosis not present

## 2017-01-01 DIAGNOSIS — E1151 Type 2 diabetes mellitus with diabetic peripheral angiopathy without gangrene: Secondary | ICD-10-CM | POA: Diagnosis not present

## 2017-01-01 DIAGNOSIS — I10 Essential (primary) hypertension: Secondary | ICD-10-CM | POA: Diagnosis not present

## 2017-01-05 DIAGNOSIS — M6281 Muscle weakness (generalized): Secondary | ICD-10-CM | POA: Diagnosis not present

## 2017-01-05 DIAGNOSIS — E1151 Type 2 diabetes mellitus with diabetic peripheral angiopathy without gangrene: Secondary | ICD-10-CM | POA: Diagnosis not present

## 2017-01-05 DIAGNOSIS — Z96611 Presence of right artificial shoulder joint: Secondary | ICD-10-CM | POA: Diagnosis not present

## 2017-01-05 DIAGNOSIS — I2510 Atherosclerotic heart disease of native coronary artery without angina pectoris: Secondary | ICD-10-CM | POA: Diagnosis not present

## 2017-01-05 DIAGNOSIS — I10 Essential (primary) hypertension: Secondary | ICD-10-CM | POA: Diagnosis not present

## 2017-01-05 DIAGNOSIS — Z471 Aftercare following joint replacement surgery: Secondary | ICD-10-CM | POA: Diagnosis not present

## 2017-01-06 ENCOUNTER — Encounter: Payer: Self-pay | Admitting: Surgery

## 2017-01-06 DIAGNOSIS — I2510 Atherosclerotic heart disease of native coronary artery without angina pectoris: Secondary | ICD-10-CM | POA: Diagnosis not present

## 2017-01-06 DIAGNOSIS — I10 Essential (primary) hypertension: Secondary | ICD-10-CM | POA: Diagnosis not present

## 2017-01-06 DIAGNOSIS — Z96611 Presence of right artificial shoulder joint: Secondary | ICD-10-CM | POA: Diagnosis not present

## 2017-01-06 DIAGNOSIS — Z471 Aftercare following joint replacement surgery: Secondary | ICD-10-CM | POA: Diagnosis not present

## 2017-01-06 DIAGNOSIS — E1151 Type 2 diabetes mellitus with diabetic peripheral angiopathy without gangrene: Secondary | ICD-10-CM | POA: Diagnosis not present

## 2017-01-06 DIAGNOSIS — M6281 Muscle weakness (generalized): Secondary | ICD-10-CM | POA: Diagnosis not present

## 2017-01-07 DIAGNOSIS — Z96611 Presence of right artificial shoulder joint: Secondary | ICD-10-CM | POA: Diagnosis not present

## 2017-01-07 DIAGNOSIS — I2510 Atherosclerotic heart disease of native coronary artery without angina pectoris: Secondary | ICD-10-CM | POA: Diagnosis not present

## 2017-01-07 DIAGNOSIS — M6281 Muscle weakness (generalized): Secondary | ICD-10-CM | POA: Diagnosis not present

## 2017-01-07 DIAGNOSIS — I10 Essential (primary) hypertension: Secondary | ICD-10-CM | POA: Diagnosis not present

## 2017-01-07 DIAGNOSIS — E1151 Type 2 diabetes mellitus with diabetic peripheral angiopathy without gangrene: Secondary | ICD-10-CM | POA: Diagnosis not present

## 2017-01-07 DIAGNOSIS — Z471 Aftercare following joint replacement surgery: Secondary | ICD-10-CM | POA: Diagnosis not present

## 2017-01-08 DIAGNOSIS — Z96611 Presence of right artificial shoulder joint: Secondary | ICD-10-CM | POA: Diagnosis not present

## 2017-01-08 DIAGNOSIS — I2510 Atherosclerotic heart disease of native coronary artery without angina pectoris: Secondary | ICD-10-CM | POA: Diagnosis not present

## 2017-01-08 DIAGNOSIS — E1151 Type 2 diabetes mellitus with diabetic peripheral angiopathy without gangrene: Secondary | ICD-10-CM | POA: Diagnosis not present

## 2017-01-08 DIAGNOSIS — I10 Essential (primary) hypertension: Secondary | ICD-10-CM | POA: Diagnosis not present

## 2017-01-08 DIAGNOSIS — Z471 Aftercare following joint replacement surgery: Secondary | ICD-10-CM | POA: Diagnosis not present

## 2017-01-08 DIAGNOSIS — M6281 Muscle weakness (generalized): Secondary | ICD-10-CM | POA: Diagnosis not present

## 2017-01-10 ENCOUNTER — Other Ambulatory Visit: Payer: Self-pay | Admitting: Cardiovascular Disease

## 2017-01-11 DIAGNOSIS — E1151 Type 2 diabetes mellitus with diabetic peripheral angiopathy without gangrene: Secondary | ICD-10-CM | POA: Diagnosis not present

## 2017-01-11 DIAGNOSIS — Z471 Aftercare following joint replacement surgery: Secondary | ICD-10-CM | POA: Diagnosis not present

## 2017-01-11 DIAGNOSIS — Z96611 Presence of right artificial shoulder joint: Secondary | ICD-10-CM | POA: Diagnosis not present

## 2017-01-11 DIAGNOSIS — I2510 Atherosclerotic heart disease of native coronary artery without angina pectoris: Secondary | ICD-10-CM | POA: Diagnosis not present

## 2017-01-11 DIAGNOSIS — M6281 Muscle weakness (generalized): Secondary | ICD-10-CM | POA: Diagnosis not present

## 2017-01-11 DIAGNOSIS — I10 Essential (primary) hypertension: Secondary | ICD-10-CM | POA: Diagnosis not present

## 2017-01-11 NOTE — Telephone Encounter (Signed)
REFILL 

## 2017-01-12 DIAGNOSIS — Z96611 Presence of right artificial shoulder joint: Secondary | ICD-10-CM | POA: Diagnosis not present

## 2017-01-12 DIAGNOSIS — E1151 Type 2 diabetes mellitus with diabetic peripheral angiopathy without gangrene: Secondary | ICD-10-CM | POA: Diagnosis not present

## 2017-01-12 DIAGNOSIS — I10 Essential (primary) hypertension: Secondary | ICD-10-CM | POA: Diagnosis not present

## 2017-01-12 DIAGNOSIS — I2510 Atherosclerotic heart disease of native coronary artery without angina pectoris: Secondary | ICD-10-CM | POA: Diagnosis not present

## 2017-01-12 DIAGNOSIS — Z471 Aftercare following joint replacement surgery: Secondary | ICD-10-CM | POA: Diagnosis not present

## 2017-01-12 DIAGNOSIS — M6281 Muscle weakness (generalized): Secondary | ICD-10-CM | POA: Diagnosis not present

## 2017-01-13 DIAGNOSIS — H353222 Exudative age-related macular degeneration, left eye, with inactive choroidal neovascularization: Secondary | ICD-10-CM | POA: Diagnosis not present

## 2017-01-13 DIAGNOSIS — H353124 Nonexudative age-related macular degeneration, left eye, advanced atrophic with subfoveal involvement: Secondary | ICD-10-CM | POA: Diagnosis not present

## 2017-01-13 DIAGNOSIS — H353113 Nonexudative age-related macular degeneration, right eye, advanced atrophic without subfoveal involvement: Secondary | ICD-10-CM | POA: Diagnosis not present

## 2017-01-13 DIAGNOSIS — H353212 Exudative age-related macular degeneration, right eye, with inactive choroidal neovascularization: Secondary | ICD-10-CM | POA: Diagnosis not present

## 2017-01-14 DIAGNOSIS — E1151 Type 2 diabetes mellitus with diabetic peripheral angiopathy without gangrene: Secondary | ICD-10-CM | POA: Diagnosis not present

## 2017-01-14 DIAGNOSIS — Z471 Aftercare following joint replacement surgery: Secondary | ICD-10-CM | POA: Diagnosis not present

## 2017-01-14 DIAGNOSIS — Z96611 Presence of right artificial shoulder joint: Secondary | ICD-10-CM | POA: Diagnosis not present

## 2017-01-14 DIAGNOSIS — M6281 Muscle weakness (generalized): Secondary | ICD-10-CM | POA: Diagnosis not present

## 2017-01-14 DIAGNOSIS — I2510 Atherosclerotic heart disease of native coronary artery without angina pectoris: Secondary | ICD-10-CM | POA: Diagnosis not present

## 2017-01-14 DIAGNOSIS — I10 Essential (primary) hypertension: Secondary | ICD-10-CM | POA: Diagnosis not present

## 2017-01-15 DIAGNOSIS — D62 Acute posthemorrhagic anemia: Secondary | ICD-10-CM | POA: Diagnosis not present

## 2017-01-15 DIAGNOSIS — I10 Essential (primary) hypertension: Secondary | ICD-10-CM | POA: Diagnosis not present

## 2017-01-15 DIAGNOSIS — R531 Weakness: Secondary | ICD-10-CM | POA: Diagnosis not present

## 2017-01-15 DIAGNOSIS — K921 Melena: Secondary | ICD-10-CM | POA: Diagnosis not present

## 2017-01-15 DIAGNOSIS — R195 Other fecal abnormalities: Secondary | ICD-10-CM | POA: Diagnosis not present

## 2017-01-15 DIAGNOSIS — Z6823 Body mass index (BMI) 23.0-23.9, adult: Secondary | ICD-10-CM | POA: Diagnosis not present

## 2017-01-18 ENCOUNTER — Ambulatory Visit (HOSPITAL_COMMUNITY)
Admission: RE | Admit: 2017-01-18 | Discharge: 2017-01-18 | Disposition: A | Discharge status: Home / Self Care | Payer: Medicare Other | Source: Ambulatory Visit | Attending: Surgery | Admitting: Surgery

## 2017-01-18 ENCOUNTER — Encounter: Payer: Self-pay | Admitting: Surgery

## 2017-01-18 ENCOUNTER — Ambulatory Visit (INDEPENDENT_AMBULATORY_CARE_PROVIDER_SITE_OTHER): Payer: Medicare Other | Admitting: Surgery

## 2017-01-18 VITALS — BP 112/67 | HR 93 | Temp 97.5°F | Resp 18 | Ht 65.0 in | Wt 143.0 lb

## 2017-01-18 DIAGNOSIS — I2510 Atherosclerotic heart disease of native coronary artery without angina pectoris: Secondary | ICD-10-CM | POA: Diagnosis not present

## 2017-01-18 DIAGNOSIS — Z95828 Presence of other vascular implants and grafts: Secondary | ICD-10-CM | POA: Diagnosis not present

## 2017-01-18 DIAGNOSIS — I70211 Atherosclerosis of native arteries of extremities with intermittent claudication, right leg: Secondary | ICD-10-CM | POA: Insufficient documentation

## 2017-01-18 DIAGNOSIS — M6281 Muscle weakness (generalized): Secondary | ICD-10-CM | POA: Diagnosis not present

## 2017-01-18 DIAGNOSIS — I70213 Atherosclerosis of native arteries of extremities with intermittent claudication, bilateral legs: Secondary | ICD-10-CM

## 2017-01-18 DIAGNOSIS — Z471 Aftercare following joint replacement surgery: Secondary | ICD-10-CM | POA: Diagnosis not present

## 2017-01-18 DIAGNOSIS — E1151 Type 2 diabetes mellitus with diabetic peripheral angiopathy without gangrene: Secondary | ICD-10-CM | POA: Diagnosis not present

## 2017-01-18 DIAGNOSIS — Z96611 Presence of right artificial shoulder joint: Secondary | ICD-10-CM | POA: Diagnosis not present

## 2017-01-18 DIAGNOSIS — T82858D Stenosis of vascular prosthetic devices, implants and grafts, subsequent encounter: Secondary | ICD-10-CM

## 2017-01-18 DIAGNOSIS — I10 Essential (primary) hypertension: Secondary | ICD-10-CM | POA: Diagnosis not present

## 2017-01-18 NOTE — Progress Notes (Signed)
[facility] [place]  Patient name: Christian Bailey MRN: [id number] DOB: 1941/04/23 Sex: male   REASON FOR VISIT:    Follow up  HISOTRY OF PRESENT ILLNESS:    Christian Bailey is a 76 y.o. male who is status post right femoral-popliteal bypass graft with vein on 09/18/2016.  This was done for claudication.  Postoperatively, the patient's ABIs were below what I thought they should be and therefore he underwent angiography which revealed a widely patent bypass graft and no evidence for the reason why the ABIs were low.  He returned home and his walking improve  The patient has undergone shoulder surgery and some time recently he has stated that his right leg feels the way it did before surgery.  He continues to take his Plavix and aspirin.  He does not have any open wounds.  He continues to take a statin for hypercholesterolemia and an ACE inhibitor for hypertension.  He is not smoking.    PAST MEDICAL HISTORY:   Past Medical History:  Diagnosis Date  . Anemia   . Complication of anesthesia   . Diabetes mellitus ([place])    TYPE 2  . GERD (gastroesophageal reflux disease)   . GI bleed   . History of blood transfusion    GI bleed  . History of colon polyps   . History of hiatal hernia   . Hyperlipemia   . Hypertension   . Osteoarthritis   . PAD (peripheral artery disease) ([facility])    a. stenting of his left common iliac artery >20 years ago. b. h/o LEIA stent and 2 stents to R SFA in 2011. c. 04/2014:  s/p PTA of right SFA for in-stent restenosis, occluded left SFA  . PONV (postoperative nausea and vomiting)    no porblem with the last 3 surgeries  . RBBB (right bundle branch block with left anterior fascicular block)    NUCLEAR STRESS TEST, 08/18/2010 - no significant wall motion abnoramlities noted, post-stress EF 69%, normal myocardial perfusion study  . Sinus tachycardia    a. Noted during admission 04/2014 but upon review seems  to be frequent finding for patient.  . Stenosis of carotid artery    a. 50% right carotid stenosis by angiogram 04/2014.     FAMILY HISTORY:   Family History  Problem Relation Age of Onset  . Heart disease Mother   . Leukemia Mother   . Stomach cancer Father   . Esophageal cancer Brother   . Liver disease Brother   . Alcoholism Brother   . Leukemia Brother   . Leukemia Brother   . Diabetes type II Brother   . Lung disease Sister     SOCIAL HISTORY:   Social History  Substance Use Topics  . Smoking status: Former Smoker    Years: 25.00    Quit date: 06/1988  . Smokeless tobacco: Never Used  . Alcohol use Yes     Comment: rarely     ALLERGIES:   Allergies  Allergen Reactions  . Asa [Aspirin] Other (See Comments)    GI bleeding  . Oxycodone-Acetaminophen Other (See Comments)    Dizziness, uncomfortable      CURRENT MEDICATIONS:   Current Outpatient Prescriptions  Medication Sig Dispense Refill  . clopidogrel (PLAVIX) 75 MG tablet TAKE 1 TABLET DAILY 90 tablet 0  . Coenzyme Q10 (COQ10 PO) Take 1 tablet by mouth daily.    . ferrous sulfate 325 (65 FE) MG EC tablet Take 1 tablet (325  mg total) by mouth 2 (two) times daily. 60 tablet 0  . glucosamine-chondroitin 500-400 MG tablet Take 1 tablet by mouth 2 (two) times daily.    [name] lisinopril (PRINIVIL,ZESTRIL) 5 MG tablet Take 5 mg by mouth 2 (two) times daily.     . metFORMIN (GLUCOPHAGE) 1000 MG tablet Take 1 tablet (1,000 mg total) by mouth 2 (two) times daily with a meal.    . methocarbamol (ROBAXIN) 500 MG tablet Take 1 tablet (500 mg total) by mouth 3 (three) times daily as needed. 60 tablet 1  . metoprolol succinate (TOPROL-XL) 25 MG 24 hr tablet Take 25 mg by mouth 2 (two) times daily.     . Omega-3 Fatty Acids (FISH OIL) 1200 MG CAPS Take 1,200 mg by mouth daily.    . pantoprazole (PROTONIX) 40 MG tablet Take 2 tablets (80 mg total) by mouth daily. 180 tablet 2  . rosuvastatin (CRESTOR) 20 MG tablet Take 20  mg by mouth every evening.      No current facility-administered medications for this visit.     REVIEW OF SYSTEMS:   [X]  denotes positive finding, [ ]  denotes negative finding Cardiac  Comments:  Chest pain or chest pressure:    Shortness of breath upon exertion:    Short of breath when lying flat:    Irregular heart rhythm:        Vascular    Pain in calf, thigh, or hip brought on by ambulation: x   Pain in feet at night that wakes you up from your sleep:     Blood clot in your veins:    Leg swelling:         Pulmonary    Oxygen at home:    Productive cough:     Wheezing:         Neurologic    Sudden weakness in arms or legs:     Sudden numbness in arms or legs:     Sudden onset of difficulty speaking or slurred speech:    Temporary loss of vision in one eye:     Problems with dizziness:         Gastrointestinal    Blood in stool:     Vomited blood:         Genitourinary    Burning when urinating:     Blood in urine:        Psychiatric    Major depression:         Hematologic    Bleeding problems:    Problems with blood clotting too easily:        Skin    Rashes or ulcers:        Constitutional    Fever or chills:      PHYSICAL EXAM:   Vitals:   01/18/17 1613  BP: 112/67  Pulse: 93  Resp: 18  Temp: (!) 97.5 F (36.4 C)  SpO2: 93%  Weight: 143 lb (64.9 kg)  Height: 5\' 5"  (1.651 m)    GENERAL: The patient is a well-nourished male, in no acute distress. The vital signs are documented above. CARDIAC: There is a regular rate and rhythm.  VASCULAR: Nonpalpable pedal pulse PULMONARY: Non-labored respirations MUSCULOSKELETAL: There are no major deformities or cyanosis. NEUROLOGIC: No focal weakness or paresthesias are detected. SKIN: There are no ulcers or rashes noted. PSYCHIATRIC: The patient has a normal affect.  STUDIES:   Duplex today shows a patent bypass graft however increased velocities at the proximal anastomosis  and proximal  graft. ABI 0.52 on the right 0.65 left  MEDICAL ISSUES:   I suspect the patient has developed some polyp narrowing within his graft which has caused his ABI to go down to 0.52.  Previously was 0.94.  We discussed proceeding with angiography to define the problem and to intervene if needed.  I suspect he has a calcified or retained valve.  I'll plan on cannulation of the left groin studying both legs and intervening on the right as indicated.  This been scheduled for Wednesday, August 1.  The risks and benefits of the procedure were discussed with the patient as he is willing to proceed.    [name], MD [facility] [phone] Pager [phone]

## 2017-01-19 DIAGNOSIS — M6281 Muscle weakness (generalized): Secondary | ICD-10-CM | POA: Diagnosis not present

## 2017-01-19 DIAGNOSIS — Z471 Aftercare following joint replacement surgery: Secondary | ICD-10-CM | POA: Diagnosis not present

## 2017-01-19 DIAGNOSIS — Z96611 Presence of right artificial shoulder joint: Secondary | ICD-10-CM | POA: Diagnosis not present

## 2017-01-19 DIAGNOSIS — E1151 Type 2 diabetes mellitus with diabetic peripheral angiopathy without gangrene: Secondary | ICD-10-CM | POA: Diagnosis not present

## 2017-01-19 DIAGNOSIS — I2510 Atherosclerotic heart disease of native coronary artery without angina pectoris: Secondary | ICD-10-CM | POA: Diagnosis not present

## 2017-01-19 DIAGNOSIS — I10 Essential (primary) hypertension: Secondary | ICD-10-CM | POA: Diagnosis not present

## 2017-01-22 ENCOUNTER — Other Ambulatory Visit: Payer: Self-pay

## 2017-01-22 DIAGNOSIS — M6281 Muscle weakness (generalized): Secondary | ICD-10-CM | POA: Diagnosis not present

## 2017-01-22 DIAGNOSIS — I10 Essential (primary) hypertension: Secondary | ICD-10-CM | POA: Diagnosis not present

## 2017-01-22 DIAGNOSIS — Z471 Aftercare following joint replacement surgery: Secondary | ICD-10-CM | POA: Diagnosis not present

## 2017-01-22 DIAGNOSIS — Z96611 Presence of right artificial shoulder joint: Secondary | ICD-10-CM | POA: Diagnosis not present

## 2017-01-22 DIAGNOSIS — E1151 Type 2 diabetes mellitus with diabetic peripheral angiopathy without gangrene: Secondary | ICD-10-CM | POA: Diagnosis not present

## 2017-01-22 DIAGNOSIS — I2510 Atherosclerotic heart disease of native coronary artery without angina pectoris: Secondary | ICD-10-CM | POA: Diagnosis not present

## 2017-01-25 DIAGNOSIS — I10 Essential (primary) hypertension: Secondary | ICD-10-CM | POA: Diagnosis not present

## 2017-01-25 DIAGNOSIS — Z471 Aftercare following joint replacement surgery: Secondary | ICD-10-CM | POA: Diagnosis not present

## 2017-01-25 DIAGNOSIS — M6281 Muscle weakness (generalized): Secondary | ICD-10-CM | POA: Diagnosis not present

## 2017-01-25 DIAGNOSIS — E1151 Type 2 diabetes mellitus with diabetic peripheral angiopathy without gangrene: Secondary | ICD-10-CM | POA: Diagnosis not present

## 2017-01-25 DIAGNOSIS — Z96611 Presence of right artificial shoulder joint: Secondary | ICD-10-CM | POA: Diagnosis not present

## 2017-01-25 DIAGNOSIS — I2510 Atherosclerotic heart disease of native coronary artery without angina pectoris: Secondary | ICD-10-CM | POA: Diagnosis not present

## 2017-01-26 DIAGNOSIS — E1151 Type 2 diabetes mellitus with diabetic peripheral angiopathy without gangrene: Secondary | ICD-10-CM | POA: Diagnosis not present

## 2017-01-26 DIAGNOSIS — I2510 Atherosclerotic heart disease of native coronary artery without angina pectoris: Secondary | ICD-10-CM | POA: Diagnosis not present

## 2017-01-26 DIAGNOSIS — I10 Essential (primary) hypertension: Secondary | ICD-10-CM | POA: Diagnosis not present

## 2017-01-26 DIAGNOSIS — Z96611 Presence of right artificial shoulder joint: Secondary | ICD-10-CM | POA: Diagnosis not present

## 2017-01-26 DIAGNOSIS — M6281 Muscle weakness (generalized): Secondary | ICD-10-CM | POA: Diagnosis not present

## 2017-01-26 DIAGNOSIS — Z471 Aftercare following joint replacement surgery: Secondary | ICD-10-CM | POA: Diagnosis not present

## 2017-01-27 ENCOUNTER — Encounter (HOSPITAL_COMMUNITY)
Admission: RE | Disposition: A | Discharge status: Home / Self Care | Payer: Self-pay | Source: Ambulatory Visit | Attending: Surgery

## 2017-01-27 ENCOUNTER — Ambulatory Visit (HOSPITAL_COMMUNITY)
Admission: RE | Admit: 2017-01-27 | Discharge: 2017-01-27 | Disposition: A | Discharge status: Home / Self Care | Payer: Medicare Other | Source: Ambulatory Visit | Attending: Surgery | Admitting: Surgery

## 2017-01-27 DIAGNOSIS — Y832 Surgical operation with anastomosis, bypass or graft as the cause of abnormal reaction of the patient, or of later complication, without mention of misadventure at the time of the procedure: Secondary | ICD-10-CM | POA: Diagnosis not present

## 2017-01-27 DIAGNOSIS — E1151 Type 2 diabetes mellitus with diabetic peripheral angiopathy without gangrene: Secondary | ICD-10-CM | POA: Insufficient documentation

## 2017-01-27 DIAGNOSIS — I10 Essential (primary) hypertension: Secondary | ICD-10-CM | POA: Insufficient documentation

## 2017-01-27 DIAGNOSIS — K219 Gastro-esophageal reflux disease without esophagitis: Secondary | ICD-10-CM | POA: Diagnosis not present

## 2017-01-27 DIAGNOSIS — T82858A Stenosis of vascular prosthetic devices, implants and grafts, initial encounter: Secondary | ICD-10-CM | POA: Insufficient documentation

## 2017-01-27 DIAGNOSIS — Z79899 Other long term (current) drug therapy: Secondary | ICD-10-CM | POA: Diagnosis not present

## 2017-01-27 DIAGNOSIS — Z7902 Long term (current) use of antithrombotics/antiplatelets: Secondary | ICD-10-CM | POA: Diagnosis not present

## 2017-01-27 DIAGNOSIS — M1990 Unspecified osteoarthritis, unspecified site: Secondary | ICD-10-CM | POA: Diagnosis not present

## 2017-01-27 DIAGNOSIS — Z7984 Long term (current) use of oral hypoglycemic drugs: Secondary | ICD-10-CM | POA: Diagnosis not present

## 2017-01-27 DIAGNOSIS — Z886 Allergy status to analgesic agent status: Secondary | ICD-10-CM | POA: Insufficient documentation

## 2017-01-27 DIAGNOSIS — E785 Hyperlipidemia, unspecified: Secondary | ICD-10-CM | POA: Diagnosis not present

## 2017-01-27 DIAGNOSIS — Z87891 Personal history of nicotine dependence: Secondary | ICD-10-CM | POA: Insufficient documentation

## 2017-01-27 HISTORY — PX: ABDOMINAL AORTOGRAM W/LOWER EXTREMITY: CATH118223

## 2017-01-27 HISTORY — PX: PERIPHERAL VASCULAR ATHERECTOMY: CATH118256

## 2017-01-27 LAB — POCT ACTIVATED CLOTTING TIME
ACTIVATED CLOTTING TIME: 224 s
ACTIVATED CLOTTING TIME: 230 s
Activated Clotting Time: 186 seconds
Activated Clotting Time: 175 seconds

## 2017-01-27 LAB — POCT I-STAT, CHEM 8
BUN: 21 mg/dL — AB (ref 6–20)
CALCIUM ION: 1.24 mmol/L (ref 1.15–1.40)
CHLORIDE: 102 mmol/L (ref 101–111)
CREATININE: 1.2 mg/dL (ref 0.61–1.24)
Glucose, Bld: 141 mg/dL — ABNORMAL HIGH (ref 65–99)
HEMATOCRIT: 31 % — AB (ref 39.0–52.0)
Hemoglobin: 10.5 g/dL — ABNORMAL LOW (ref 13.0–17.0)
Potassium: 4.2 mmol/L (ref 3.5–5.1)
SODIUM: 142 mmol/L (ref 135–145)
TCO2: 28 mmol/L (ref 0–100)

## 2017-01-27 LAB — GLUCOSE, CAPILLARY
GLUCOSE-CAPILLARY: 108 mg/dL — AB (ref 65–99)
Glucose-Capillary: 128 mg/dL — ABNORMAL HIGH (ref 65–99)

## 2017-01-27 SURGERY — ABDOMINAL AORTOGRAM W/LOWER EXTREMITY
Anesthesia: LOCAL | Laterality: Right

## 2017-01-27 MED ORDER — HYDRALAZINE HCL 20 MG/ML IJ SOLN
5.0000 mg | INTRAMUSCULAR | Status: DC | PRN
Start: 2017-01-27 — End: 2017-01-27

## 2017-01-27 MED ORDER — LIDOCAINE HCL (PF) 1 % IJ SOLN
INTRAMUSCULAR | Status: DC | PRN
  Administered 2017-01-27: 16 mL via SUBCUTANEOUS

## 2017-01-27 MED ORDER — MIDAZOLAM HCL 2 MG/2ML IJ SOLN
INTRAMUSCULAR | Status: AC
  Filled 2017-01-27: qty 2

## 2017-01-27 MED ORDER — HEPARIN SODIUM (PORCINE) 1000 UNIT/ML IJ SOLN
INTRAMUSCULAR | Status: DC | PRN
  Administered 2017-01-27: 7000 [IU] via INTRAVENOUS

## 2017-01-27 MED ORDER — LIDOCAINE HCL (PF) 1 % IJ SOLN
INTRAMUSCULAR | Status: AC
  Filled 2017-01-27: qty 30

## 2017-01-27 MED ORDER — FENTANYL CITRATE (PF) 100 MCG/2ML IJ SOLN
INTRAMUSCULAR | Status: DC | PRN
  Administered 2017-01-27: 25 ug via INTRAVENOUS
  Administered 2017-01-27: 50 ug via INTRAVENOUS
  Administered 2017-01-27: 25 ug via INTRAVENOUS

## 2017-01-27 MED ORDER — DOCUSATE SODIUM 100 MG PO CAPS: 100.0000 mg | ORAL_CAPSULE | Freq: Every day | ORAL | Status: DC

## 2017-01-27 MED ORDER — MIDAZOLAM HCL 2 MG/2ML IJ SOLN
INTRAMUSCULAR | Status: AC
Start: 2017-01-27 — End: ?
  Filled 2017-01-27: qty 2

## 2017-01-27 MED ORDER — ONDANSETRON HCL 4 MG/2ML IJ SOLN: 4.0000 mg | Freq: Four times a day (QID) | INTRAMUSCULAR | Status: DC | PRN

## 2017-01-27 MED ORDER — NITROGLYCERIN IN D5W 200-5 MCG/ML-% IV SOLN
INTRAVENOUS | Status: AC
  Filled 2017-01-27: qty 250

## 2017-01-27 MED ORDER — IODIXANOL 320 MG/ML IV SOLN
INTRAVENOUS | Status: DC | PRN
  Administered 2017-01-27: 145 mL via INTRA_ARTERIAL

## 2017-01-27 MED ORDER — SODIUM CHLORIDE 0.9 % IV SOLN
INTRAVENOUS | Status: DC
  Administered 2017-01-27: 10:00:00 via INTRAVENOUS

## 2017-01-27 MED ORDER — HEPARIN (PORCINE) IN NACL 2-0.9 UNIT/ML-% IJ SOLN
INTRAMUSCULAR | Status: AC
  Filled 2017-01-27: qty 1000

## 2017-01-27 MED ORDER — MORPHINE SULFATE (PF) 10 MG/ML IV SOLN: 2.0000 mg | INTRAVENOUS | Status: DC | PRN

## 2017-01-27 MED ORDER — HEPARIN SODIUM (PORCINE) 1000 UNIT/ML IJ SOLN
INTRAMUSCULAR | Status: AC
  Filled 2017-01-27: qty 1

## 2017-01-27 MED ORDER — SODIUM CHLORIDE 0.9 % IV SOLN: 1.0000 mL/kg/h | INTRAVENOUS | Status: DC

## 2017-01-27 MED ORDER — NITROGLYCERIN 1 MG/10 ML FOR IR/CATH LAB
INTRA_ARTERIAL | Status: DC | PRN
  Administered 2017-01-27: 400 ug via INTRA_ARTERIAL

## 2017-01-27 MED ORDER — ALUM & MAG HYDROXIDE-SIMETH 200-200-20 MG/5ML PO SUSP
15.0000 mL | ORAL | Status: DC | PRN
  Filled 2017-01-27: qty 30

## 2017-01-27 MED ORDER — METOPROLOL TARTRATE 5 MG/5ML IV SOLN: 2.0000 mg | INTRAVENOUS | Status: DC | PRN

## 2017-01-27 MED ORDER — PHENOL 1.4 % MT LIQD
1.0000 | OROMUCOSAL | Status: DC | PRN
  Filled 2017-01-27: qty 177

## 2017-01-27 MED ORDER — FENTANYL CITRATE (PF) 100 MCG/2ML IJ SOLN
INTRAMUSCULAR | Status: AC
  Filled 2017-01-27: qty 2

## 2017-01-27 MED ORDER — LABETALOL HCL 5 MG/ML IV SOLN: 10.0000 mg | INTRAVENOUS | Status: DC | PRN

## 2017-01-27 MED ORDER — VERAPAMIL HCL 2.5 MG/ML IV SOLN
INTRAVENOUS | Status: AC
  Filled 2017-01-27: qty 2

## 2017-01-27 MED ORDER — SODIUM CHLORIDE 0.9 % IV SOLN: 500.0000 mL | Freq: Once | INTRAVENOUS | Status: DC | PRN

## 2017-01-27 MED ORDER — GUAIFENESIN-DM 100-10 MG/5ML PO SYRP
15.0000 mL | ORAL_SOLUTION | ORAL | Status: DC | PRN
  Filled 2017-01-27: qty 15

## 2017-01-27 MED ORDER — VIPERSLIDE LUBRICANT OPTIME
TOPICAL | Status: DC | PRN
  Administered 2017-01-27: 13:00:00 via SURGICAL_CAVITY

## 2017-01-27 MED ORDER — MIDAZOLAM HCL 2 MG/2ML IJ SOLN
INTRAMUSCULAR | Status: DC | PRN
  Administered 2017-01-27: 1 mg via INTRAVENOUS
  Administered 2017-01-27: 2 mg via INTRAVENOUS

## 2017-01-27 SURGICAL SUPPLY — 19 items
BALLN LUTONIX DCB 5X40X130 (BALLOONS) ×3
BALLOON LUTONIX DCB 5X40X130 (BALLOONS) ×2 IMPLANT
CATH OMNI FLUSH 5F 65CM (CATHETERS) ×3 IMPLANT
CATH STRAIGHT 5FR 65CM (CATHETERS) ×3 IMPLANT
DEVICE CONTINUOUS FLUSH (MISCELLANEOUS) ×3 IMPLANT
DIAMONDBACK CLASSIC OAS 2.0MM (CATHETERS) ×3
KIT ENCORE 26 ADVANTAGE (KITS) ×3 IMPLANT
KIT MICROINTRODUCER STIFF 5F (SHEATH) ×3 IMPLANT
KIT PV (KITS) ×3 IMPLANT
LUBRICANT VIPERSLIDE CORONARY (MISCELLANEOUS) ×3 IMPLANT
SHEATH FLEXOR ANSEL 1 7F 45CM (SHEATH) ×3 IMPLANT
SHEATH PINNACLE 5F 10CM (SHEATH) ×3 IMPLANT
STOPCOCK MORSE 400PSI 3WAY (MISCELLANEOUS) ×6 IMPLANT
SYR MEDRAD MARK V 150ML (SYRINGE) ×3 IMPLANT
SYSTEM DIMODBCK CLSC OAS 2.0MM (CATHETERS) ×2 IMPLANT
TRANSDUCER W/STOPCOCK (MISCELLANEOUS) ×3 IMPLANT
TRAY PV CATH (CUSTOM PROCEDURE TRAY) ×3 IMPLANT
WIRE BENTSON .035X145CM (WIRE) ×3 IMPLANT
WIRE VIPER ADVANCE .017X335CM (WIRE) ×3 IMPLANT

## 2017-01-27 NOTE — Interval H&P Note (Signed)
History and Physical Interval Note:  01/27/2017 12:18 PM  Christian Bailey  has presented today for surgery, with the diagnosis of pad  The various methods of treatment have been discussed with the patient and family. After consideration of risks, benefits and other options for treatment, the patient has consented to  Procedure(s): Abdominal Aortogram w/Lower Extremity (N/A) as a surgical intervention .  The patient's history has been reviewed, patient examined, no change in status, stable for surgery.  I have reviewed the patient's chart and labs.  Questions were answered to the patient's satisfaction.     [name]

## 2017-01-27 NOTE — Progress Notes (Signed)
Site area: left groin fa sheath Site Prior to Removal:  Level 0 Pressure Applied For: 25 minutes Manual:   yes Patient Status During Pull:  stable Post Pull Site:  Level 0 Post Pull Instructions Given:  yes Post Pull Pulses Present: dopplered Dressing Applied:  Gauze and tegaderm Bedrest begins @ 6190 Comments:

## 2017-01-27 NOTE — H&P (View-Only) (Signed)
[facility] [place]  Patient name: Christian Bailey MRN: [id number] DOB: 06/04/1941 Sex: male   REASON FOR VISIT:    Follow up  HISOTRY OF PRESENT ILLNESS:    Christian Bailey is a 76 y.o. male who is status post right femoral-popliteal bypass graft with vein on 09/18/2016.  This was done for claudication.  Postoperatively, the patient's ABIs were below what I thought they should be and therefore he underwent angiography which revealed a widely patent bypass graft and no evidence for the reason why the ABIs were low.  He returned home and his walking improve  The patient has undergone shoulder surgery and some time recently he has stated that his right leg feels the way it did before surgery.  He continues to take his Plavix and aspirin.  He does not have any open wounds.  He continues to take a statin for hypercholesterolemia and an ACE inhibitor for hypertension.  He is not smoking.    PAST MEDICAL HISTORY:   Past Medical History:  Diagnosis Date  . Anemia   . Complication of anesthesia   . Diabetes mellitus ([place])    TYPE 2  . GERD (gastroesophageal reflux disease)   . GI bleed   . History of blood transfusion    GI bleed  . History of colon polyps   . History of hiatal hernia   . Hyperlipemia   . Hypertension   . Osteoarthritis   . PAD (peripheral artery disease) ([facility])    a. stenting of his left common iliac artery >20 years ago. b. h/o LEIA stent and 2 stents to R SFA in 2011. c. 04/2014:  s/p PTA of right SFA for in-stent restenosis, occluded left SFA  . PONV (postoperative nausea and vomiting)    no porblem with the last 3 surgeries  . RBBB (right bundle branch block with left anterior fascicular block)    NUCLEAR STRESS TEST, 08/18/2010 - no significant wall motion abnoramlities noted, post-stress EF 69%, normal myocardial perfusion study  . Sinus tachycardia    a. Noted during admission 04/2014 but upon review seems  to be frequent finding for patient.  . Stenosis of carotid artery    a. 50% right carotid stenosis by angiogram 04/2014.     FAMILY HISTORY:   Family History  Problem Relation Age of Onset  . Heart disease Mother   . Leukemia Mother   . Stomach cancer Father   . Esophageal cancer Brother   . Liver disease Brother   . Alcoholism Brother   . Leukemia Brother   . Leukemia Brother   . Diabetes type II Brother   . Lung disease Sister     SOCIAL HISTORY:   Social History  Substance Use Topics  . Smoking status: Former Smoker    Years: 25.00    Quit date: 06/1988  . Smokeless tobacco: Never Used  . Alcohol use Yes     Comment: rarely     ALLERGIES:   Allergies  Allergen Reactions  . Asa [Aspirin] Other (See Comments)    GI bleeding  . Oxycodone-Acetaminophen Other (See Comments)    Dizziness, uncomfortable      CURRENT MEDICATIONS:   Current Outpatient Prescriptions  Medication Sig Dispense Refill  . clopidogrel (PLAVIX) 75 MG tablet TAKE 1 TABLET DAILY 90 tablet 0  . Coenzyme Q10 (COQ10 PO) Take 1 tablet by mouth daily.    . ferrous sulfate 325 (65 FE) MG EC tablet Take 1 tablet (325  mg total) by mouth 2 (two) times daily. 60 tablet 0  . glucosamine-chondroitin 500-400 MG tablet Take 1 tablet by mouth 2 (two) times daily.    [name] lisinopril (PRINIVIL,ZESTRIL) 5 MG tablet Take 5 mg by mouth 2 (two) times daily.     . metFORMIN (GLUCOPHAGE) 1000 MG tablet Take 1 tablet (1,000 mg total) by mouth 2 (two) times daily with a meal.    . methocarbamol (ROBAXIN) 500 MG tablet Take 1 tablet (500 mg total) by mouth 3 (three) times daily as needed. 60 tablet 1  . metoprolol succinate (TOPROL-XL) 25 MG 24 hr tablet Take 25 mg by mouth 2 (two) times daily.     . Omega-3 Fatty Acids (FISH OIL) 1200 MG CAPS Take 1,200 mg by mouth daily.    . pantoprazole (PROTONIX) 40 MG tablet Take 2 tablets (80 mg total) by mouth daily. 180 tablet 2  . rosuvastatin (CRESTOR) 20 MG tablet Take 20  mg by mouth every evening.      No current facility-administered medications for this visit.     REVIEW OF SYSTEMS:   [X]  denotes positive finding, [ ]  denotes negative finding Cardiac  Comments:  Chest pain or chest pressure:    Shortness of breath upon exertion:    Short of breath when lying flat:    Irregular heart rhythm:        Vascular    Pain in calf, thigh, or hip brought on by ambulation: x   Pain in feet at night that wakes you up from your sleep:     Blood clot in your veins:    Leg swelling:         Pulmonary    Oxygen at home:    Productive cough:     Wheezing:         Neurologic    Sudden weakness in arms or legs:     Sudden numbness in arms or legs:     Sudden onset of difficulty speaking or slurred speech:    Temporary loss of vision in one eye:     Problems with dizziness:         Gastrointestinal    Blood in stool:     Vomited blood:         Genitourinary    Burning when urinating:     Blood in urine:        Psychiatric    Major depression:         Hematologic    Bleeding problems:    Problems with blood clotting too easily:        Skin    Rashes or ulcers:        Constitutional    Fever or chills:      PHYSICAL EXAM:   Vitals:   01/18/17 1613  BP: 112/67  Pulse: 93  Resp: 18  Temp: (!) 97.5 F (36.4 C)  SpO2: 93%  Weight: 143 lb (64.9 kg)  Height: 5\' 5"  (1.651 m)    GENERAL: The patient is a well-nourished male, in no acute distress. The vital signs are documented above. CARDIAC: There is a regular rate and rhythm.  VASCULAR: Nonpalpable pedal pulse PULMONARY: Non-labored respirations MUSCULOSKELETAL: There are no major deformities or cyanosis. NEUROLOGIC: No focal weakness or paresthesias are detected. SKIN: There are no ulcers or rashes noted. PSYCHIATRIC: The patient has a normal affect.  STUDIES:   Duplex today shows a patent bypass graft however increased velocities at the proximal anastomosis  and proximal  graft. ABI 0.52 on the right 0.65 left  MEDICAL ISSUES:   I suspect the patient has developed some polyp narrowing within his graft which has caused his ABI to go down to 0.52.  Previously was 0.94.  We discussed proceeding with angiography to define the problem and to intervene if needed.  I suspect he has a calcified or retained valve.  I'll plan on cannulation of the left groin studying both legs and intervening on the right as indicated.  This been scheduled for Wednesday, August 1.  The risks and benefits of the procedure were discussed with the patient as he is willing to proceed.    [name], MD [facility] [phone] Pager [phone]

## 2017-01-27 NOTE — Op Note (Signed)
Patient name: Christian Bailey MRN: [id number] DOB: Jul 03, 1940 Sex: male  01/27/2017 Pre-operative Diagnosis: Bypass graft stenosis Post-operative diagnosis:  Same Surgeon:  [name] Procedure Performed:  1.  Ultrasound-guided access, left femoral artery  2.  Abdominal aortogram  3.  Right lower extremity runoff  4.  Atherectomy, right common femoral artery  5.  Atherectomy, right femoral-popliteal bypass graft  6.  Drug coated balloon angioplasty, right common femoral artery  7.  Drug coated balloon angioplasty, right femoral-popliteal bypass graft  8.  Conscious sedation (26minutes)   9.  Intra-arterial ministration nitroglycerin   Indications:  The patient has undergone right femoropopliteal bypass graft claudication.  He has developed worsening symptoms and ultrasound showed a narrowing at the proximal anastomosis.  He is here for further evaluation.   Procedure:  The patient was identified in the holding area and taken to room 8.  The patient was then placed supine on the table and prepped and draped in the usual sterile fashion.  A time out was called.   conscious sedation was a Company secretary with the use of IV fentanyl and Versed under continuous physician and nurse monitoring.  Heart rate, blood pressure, and oxygen saturation continuously monitored.  Ultrasound was used to evaluate the left common femoral artery.  It was patent .  A digital ultrasound image was acquired.  A micropuncture needle was used to access the left common femoral artery under ultrasound guidance.  An 018 wire was advanced without resistance and a micropuncture sheath was placed.  The 018 wire was removed and a benson wire was placed.  The micropuncture sheath was exchanged for a 5 french sheath.  An omniflush catheter was advanced over the wire to the level of L-1.  An abdominal angiogram was obtained.  Next, using the omniflush catheter and a benson wire, the aortic bifurcation was crossed and the catheter was  placed into theright external iliac artery and right runoff was obtained.    Findings:   Aortogram:  No significant renal artery stenosis.  The infrarenal abdominal aorta is widely patent.  No significant iliac artery stenosis is identified bilaterally.  The stent within the left external iliac artery is patent throughout it's course.  Right Lower Extremity:  Severe luminal narrowing of approximately 60% is identified within the common femoral artery.  There is moderate stenosis at the beginning aspect of the profunda femoral artery approximate 40-50 percent.  There is also stenosis approximately 70% at the bypass graft anastomosis.  The bypass is patent throughout it's course.  The above-knee popliteal anastomosis is widely patent.  There is three-vessel runoff.  Left Lower Extremity:  Common femoral stenosis is identified   Intervention:  After the above images were acquired the decision was made to proceed with intervention.  A 7 French 45 cm antral 1 sheath was advanced into the right external iliac artery.  The patient was fully heparinized.  I then used a Careers adviser and a 5 French straight catheter and cannulated the bypass graft.  Cannulation was confirmed a contrast injection with the catheter in the bypass.  I then placed a  Viper wire.  I selected a CSI 2.0 classic device and performed atherectomy of the common femoral and the right femoral-popliteal bypass graft at low medium and high speeds.  I then selected a  Lutonix 5 x 40 drug coated balloon and perform balloon angioplasty at nominal pressure for 2 minutes.  A follow-up arteriogram was then performed which showed resolution of the stenosis.  Additional imaging of the leg was then performed which showed no change in the runoff.  Catheters and wires were removed.  The sheath was withdrawn to the left external iliac artery.  The patient be taken the holding area for sheath pull and sclerae profile corrects  Impression:  #1  approximate 60%  stenosis of the common femoral and proximal right femoral-popliteal bypass graft.  These lesions were successfully treated using orbital atherectomy, CSI 2.0 classic, and a 5 mm drug coated Lutonix balloon with no residual stenosis.  #2  patent right femoral-popliteal bypass graft without distal anastomotic stenosis.    Theotis Burrow, M.D. [facility] [place] Office: [phone] Pager:  [phone]

## 2017-01-27 NOTE — Discharge Instructions (Signed)
Angiogram, Care After °This sheet gives you information about how to care for yourself after your procedure. Your health care provider may also give you more specific instructions. If you have problems or questions, contact your health care provider. °What can I expect after the procedure? °After the procedure, it is common to have bruising and tenderness at the catheter insertion area. °Follow these instructions at home: °Insertion site care  °· Follow instructions from your health care provider about how to take care of your insertion site. Make sure you: °¨ Wash your hands with soap and water before you change your bandage (dressing). If soap and water are not available, use hand sanitizer. °¨ Change your dressing as told by your health care provider. °¨ Leave stitches (sutures), skin glue, or adhesive strips in place. These skin closures may need to stay in place for 2 weeks or longer. If adhesive strip edges start to loosen and curl up, you may trim the loose edges. Do not remove adhesive strips completely unless your health care provider tells you to do that. °· Do not take baths, swim, or use a hot tub until your health care provider approves. °· You may shower 24-48 hours after the procedure or as told by your health care provider. °¨ Gently wash the site with plain soap and water. °¨ Pat the area dry with a clean towel. °¨ Do not rub the site. This may cause bleeding. °· Do not apply powder or lotion to the site. Keep the site clean and dry. °· Check your insertion site every day for signs of infection. Check for: °¨ Redness, swelling, or pain. °¨ Fluid or blood. °¨ Warmth. °¨ Pus or a bad smell. °Activity  °· Rest as told by your health care provider, usually for 1-2 days. °· Do not lift anything that is heavier than 10 lbs. (4.5 kg) or as told by your health care provider. °· Do not drive for 24 hours if you were given a medicine to help you relax (sedative). °· Do not drive or use heavy machinery while  taking prescription pain medicine. °General instructions  °· Return to your normal activities as told by your health care provider, usually in about a week. Ask your health care provider what activities are safe for you. °· If the catheter site starts bleeding, lie flat and put pressure on the site. If the bleeding does not stop, get help right away. This is a medical emergency. °· Drink enough fluid to keep your urine clear or pale yellow. This helps flush the contrast dye from your body. °· Take over-the-counter and prescription medicines only as told by your health care provider. °· Keep all follow-up visits as told by your health care provider. This is important. °Contact a health care provider if: °· You have a fever or chills. °· You have redness, swelling, or pain around your insertion site. °· You have fluid or blood coming from your insertion site. °· The insertion site feels warm to the touch. °· You have pus or a bad smell coming from your insertion site. °· You have bruising around the insertion site. °· You notice blood collecting in the tissue around the catheter site (hematoma). The hematoma may be painful to the touch. °Get help right away if: °· You have severe pain at the catheter insertion area. °· The catheter insertion area swells very fast. °· The catheter insertion area is bleeding, and the bleeding does not stop when you hold steady pressure on   the area. °· The area near or just beyond the catheter insertion site becomes pale, cool, tingly, or numb. °These symptoms may represent a serious problem that is an emergency. Do not wait to see if the symptoms will go away. Get medical help right away. Call your local emergency services (911 in the [place] Do not drive yourself to the hospital. °Summary °· After the procedure, it is common to have bruising and tenderness at the catheter insertion area. °· After the procedure, it is important to rest and drink plenty of fluids. °· Do not take baths,  swim, or use a hot tub until your health care provider says it is okay to do so. You may shower 24-48 hours after the procedure or as told by your health care provider. °· If the catheter site starts bleeding, lie flat and put pressure on the site. If the bleeding does not stop, get help right away. This is a medical emergency. °This information is not intended to replace advice given to you by your health care provider. Make sure you discuss any questions you have with your health care provider. °Document Released: 01/01/2005 Document Revised: 05/20/2016 Document Reviewed: 05/20/2016 °Elsevier Interactive Patient Education © 2017 [employer] ° °

## 2017-01-28 ENCOUNTER — Encounter (HOSPITAL_COMMUNITY): Payer: Self-pay | Admitting: Surgery

## 2017-01-29 ENCOUNTER — Telehealth: Payer: Self-pay | Admitting: Surgery

## 2017-01-29 NOTE — Telephone Encounter (Signed)
-----   Message from Mena Goes, RN sent at 01/27/2017  2:03 PM EDT ----- Regarding: 1 month w/ ABIs and Rt leg duplex   ----- Message ----- From: Serafina Mitchell, MD Sent: 01/27/2017   1:39 PM To: [facility] Charge Pool  01/27/2017:  Surgeon:  [name] Procedure Performed:  1.  Ultrasound-guided access, left femoral artery  2.  Abdominal aortogram  3.  Right lower extremity runoff  4.  Atherectomy, right common femoral artery  5.  Atherectomy, right femoral-popliteal bypass graft  6.  Drug coated balloon angioplasty, right common femoral artery  7.  Drug coated balloon angioplasty, right femoral-popliteal bypass graft  8.  Conscious sedation (32minutes)   9.  Intra-arterial ministration nitroglycerin  Follow-up one month with ABI duplex of the right leg

## 2017-01-29 NOTE — Telephone Encounter (Signed)
Sched labs 03/02/17 at 10:00 and MD 03/03/17 at 3:30. Spoke to pt.

## 2017-02-03 DIAGNOSIS — M6281 Muscle weakness (generalized): Secondary | ICD-10-CM | POA: Diagnosis not present

## 2017-02-03 DIAGNOSIS — I2510 Atherosclerotic heart disease of native coronary artery without angina pectoris: Secondary | ICD-10-CM | POA: Diagnosis not present

## 2017-02-03 DIAGNOSIS — Z96611 Presence of right artificial shoulder joint: Secondary | ICD-10-CM | POA: Diagnosis not present

## 2017-02-03 DIAGNOSIS — Z471 Aftercare following joint replacement surgery: Secondary | ICD-10-CM | POA: Diagnosis not present

## 2017-02-03 DIAGNOSIS — E1151 Type 2 diabetes mellitus with diabetic peripheral angiopathy without gangrene: Secondary | ICD-10-CM | POA: Diagnosis not present

## 2017-02-03 DIAGNOSIS — I10 Essential (primary) hypertension: Secondary | ICD-10-CM | POA: Diagnosis not present

## 2017-02-04 DIAGNOSIS — E1151 Type 2 diabetes mellitus with diabetic peripheral angiopathy without gangrene: Secondary | ICD-10-CM | POA: Diagnosis not present

## 2017-02-04 DIAGNOSIS — Z96611 Presence of right artificial shoulder joint: Secondary | ICD-10-CM | POA: Diagnosis not present

## 2017-02-04 DIAGNOSIS — I10 Essential (primary) hypertension: Secondary | ICD-10-CM | POA: Diagnosis not present

## 2017-02-04 DIAGNOSIS — M6281 Muscle weakness (generalized): Secondary | ICD-10-CM | POA: Diagnosis not present

## 2017-02-04 DIAGNOSIS — I2510 Atherosclerotic heart disease of native coronary artery without angina pectoris: Secondary | ICD-10-CM | POA: Diagnosis not present

## 2017-02-04 DIAGNOSIS — Z471 Aftercare following joint replacement surgery: Secondary | ICD-10-CM | POA: Diagnosis not present

## 2017-02-08 DIAGNOSIS — Z471 Aftercare following joint replacement surgery: Secondary | ICD-10-CM | POA: Diagnosis not present

## 2017-02-08 DIAGNOSIS — M25411 Effusion, right shoulder: Secondary | ICD-10-CM | POA: Diagnosis not present

## 2017-02-08 DIAGNOSIS — Z96611 Presence of right artificial shoulder joint: Secondary | ICD-10-CM | POA: Diagnosis not present

## 2017-02-09 DIAGNOSIS — I10 Essential (primary) hypertension: Secondary | ICD-10-CM | POA: Diagnosis not present

## 2017-02-09 DIAGNOSIS — Z471 Aftercare following joint replacement surgery: Secondary | ICD-10-CM | POA: Diagnosis not present

## 2017-02-09 DIAGNOSIS — E1151 Type 2 diabetes mellitus with diabetic peripheral angiopathy without gangrene: Secondary | ICD-10-CM | POA: Diagnosis not present

## 2017-02-09 DIAGNOSIS — Z96611 Presence of right artificial shoulder joint: Secondary | ICD-10-CM | POA: Diagnosis not present

## 2017-02-09 DIAGNOSIS — M6281 Muscle weakness (generalized): Secondary | ICD-10-CM | POA: Diagnosis not present

## 2017-02-09 DIAGNOSIS — I2510 Atherosclerotic heart disease of native coronary artery without angina pectoris: Secondary | ICD-10-CM | POA: Diagnosis not present

## 2017-02-14 DIAGNOSIS — E1151 Type 2 diabetes mellitus with diabetic peripheral angiopathy without gangrene: Secondary | ICD-10-CM | POA: Diagnosis not present

## 2017-02-14 DIAGNOSIS — I10 Essential (primary) hypertension: Secondary | ICD-10-CM | POA: Diagnosis not present

## 2017-02-14 DIAGNOSIS — Z471 Aftercare following joint replacement surgery: Secondary | ICD-10-CM | POA: Diagnosis not present

## 2017-02-14 DIAGNOSIS — Z96611 Presence of right artificial shoulder joint: Secondary | ICD-10-CM | POA: Diagnosis not present

## 2017-02-14 DIAGNOSIS — I2510 Atherosclerotic heart disease of native coronary artery without angina pectoris: Secondary | ICD-10-CM | POA: Diagnosis not present

## 2017-02-14 DIAGNOSIS — M6281 Muscle weakness (generalized): Secondary | ICD-10-CM | POA: Diagnosis not present

## 2017-02-15 DIAGNOSIS — Z96611 Presence of right artificial shoulder joint: Secondary | ICD-10-CM | POA: Diagnosis not present

## 2017-02-15 DIAGNOSIS — I10 Essential (primary) hypertension: Secondary | ICD-10-CM | POA: Diagnosis not present

## 2017-02-15 DIAGNOSIS — I2510 Atherosclerotic heart disease of native coronary artery without angina pectoris: Secondary | ICD-10-CM | POA: Diagnosis not present

## 2017-02-15 DIAGNOSIS — E1151 Type 2 diabetes mellitus with diabetic peripheral angiopathy without gangrene: Secondary | ICD-10-CM | POA: Diagnosis not present

## 2017-02-15 DIAGNOSIS — M6281 Muscle weakness (generalized): Secondary | ICD-10-CM | POA: Diagnosis not present

## 2017-02-15 DIAGNOSIS — Z471 Aftercare following joint replacement surgery: Secondary | ICD-10-CM | POA: Diagnosis not present

## 2017-02-17 DIAGNOSIS — Z96611 Presence of right artificial shoulder joint: Secondary | ICD-10-CM | POA: Diagnosis not present

## 2017-02-17 DIAGNOSIS — Z471 Aftercare following joint replacement surgery: Secondary | ICD-10-CM | POA: Diagnosis not present

## 2017-02-17 NOTE — Addendum Note (Signed)
Addended by: Lianne Cure A on: 02/17/2017 08:32 AM   Modules accepted: Orders

## 2017-02-19 DIAGNOSIS — Z96611 Presence of right artificial shoulder joint: Secondary | ICD-10-CM | POA: Diagnosis not present

## 2017-02-19 DIAGNOSIS — E1151 Type 2 diabetes mellitus with diabetic peripheral angiopathy without gangrene: Secondary | ICD-10-CM | POA: Diagnosis not present

## 2017-02-19 DIAGNOSIS — M6281 Muscle weakness (generalized): Secondary | ICD-10-CM | POA: Diagnosis not present

## 2017-02-19 DIAGNOSIS — I2510 Atherosclerotic heart disease of native coronary artery without angina pectoris: Secondary | ICD-10-CM | POA: Diagnosis not present

## 2017-02-19 DIAGNOSIS — I10 Essential (primary) hypertension: Secondary | ICD-10-CM | POA: Diagnosis not present

## 2017-02-19 DIAGNOSIS — Z471 Aftercare following joint replacement surgery: Secondary | ICD-10-CM | POA: Diagnosis not present

## 2017-02-22 DIAGNOSIS — I10 Essential (primary) hypertension: Secondary | ICD-10-CM | POA: Diagnosis not present

## 2017-02-22 DIAGNOSIS — E1151 Type 2 diabetes mellitus with diabetic peripheral angiopathy without gangrene: Secondary | ICD-10-CM | POA: Diagnosis not present

## 2017-02-22 DIAGNOSIS — Z96611 Presence of right artificial shoulder joint: Secondary | ICD-10-CM | POA: Diagnosis not present

## 2017-02-22 DIAGNOSIS — M6281 Muscle weakness (generalized): Secondary | ICD-10-CM | POA: Diagnosis not present

## 2017-02-22 DIAGNOSIS — I2510 Atherosclerotic heart disease of native coronary artery without angina pectoris: Secondary | ICD-10-CM | POA: Diagnosis not present

## 2017-02-22 DIAGNOSIS — Z471 Aftercare following joint replacement surgery: Secondary | ICD-10-CM | POA: Diagnosis not present

## 2017-02-24 ENCOUNTER — Encounter: Payer: Self-pay | Admitting: Surgery

## 2017-02-24 DIAGNOSIS — I2510 Atherosclerotic heart disease of native coronary artery without angina pectoris: Secondary | ICD-10-CM | POA: Diagnosis not present

## 2017-02-24 DIAGNOSIS — E1151 Type 2 diabetes mellitus with diabetic peripheral angiopathy without gangrene: Secondary | ICD-10-CM | POA: Diagnosis not present

## 2017-02-24 DIAGNOSIS — Z471 Aftercare following joint replacement surgery: Secondary | ICD-10-CM | POA: Diagnosis not present

## 2017-02-24 DIAGNOSIS — I10 Essential (primary) hypertension: Secondary | ICD-10-CM | POA: Diagnosis not present

## 2017-02-24 DIAGNOSIS — Z96611 Presence of right artificial shoulder joint: Secondary | ICD-10-CM | POA: Diagnosis not present

## 2017-02-24 DIAGNOSIS — M6281 Muscle weakness (generalized): Secondary | ICD-10-CM | POA: Diagnosis not present

## 2017-03-02 ENCOUNTER — Ambulatory Visit (INDEPENDENT_AMBULATORY_CARE_PROVIDER_SITE_OTHER)
Admission: RE | Admit: 2017-03-02 | Discharge: 2017-03-02 | Disposition: A | Discharge status: Home / Self Care | Payer: Medicare Other | Source: Ambulatory Visit | Attending: Vascular Surgery | Admitting: Vascular Surgery

## 2017-03-02 ENCOUNTER — Ambulatory Visit (HOSPITAL_COMMUNITY)
Admission: RE | Admit: 2017-03-02 | Discharge: 2017-03-02 | Disposition: A | Discharge status: Home / Self Care | Payer: Medicare Other | Source: Ambulatory Visit | Attending: Vascular Surgery | Admitting: Vascular Surgery

## 2017-03-02 DIAGNOSIS — T82858D Stenosis of vascular prosthetic devices, implants and grafts, subsequent encounter: Secondary | ICD-10-CM | POA: Diagnosis present

## 2017-03-02 DIAGNOSIS — I10 Essential (primary) hypertension: Secondary | ICD-10-CM | POA: Diagnosis not present

## 2017-03-02 DIAGNOSIS — Z9889 Other specified postprocedural states: Secondary | ICD-10-CM | POA: Insufficient documentation

## 2017-03-02 DIAGNOSIS — I739 Peripheral vascular disease, unspecified: Secondary | ICD-10-CM | POA: Insufficient documentation

## 2017-03-02 DIAGNOSIS — Z96611 Presence of right artificial shoulder joint: Secondary | ICD-10-CM | POA: Diagnosis not present

## 2017-03-02 DIAGNOSIS — R9439 Abnormal result of other cardiovascular function study: Secondary | ICD-10-CM | POA: Diagnosis not present

## 2017-03-02 DIAGNOSIS — M6281 Muscle weakness (generalized): Secondary | ICD-10-CM | POA: Diagnosis not present

## 2017-03-02 DIAGNOSIS — X58XXXD Exposure to other specified factors, subsequent encounter: Secondary | ICD-10-CM | POA: Diagnosis not present

## 2017-03-02 DIAGNOSIS — Z471 Aftercare following joint replacement surgery: Secondary | ICD-10-CM | POA: Diagnosis not present

## 2017-03-02 DIAGNOSIS — E1151 Type 2 diabetes mellitus with diabetic peripheral angiopathy without gangrene: Secondary | ICD-10-CM | POA: Diagnosis not present

## 2017-03-02 DIAGNOSIS — I2510 Atherosclerotic heart disease of native coronary artery without angina pectoris: Secondary | ICD-10-CM | POA: Diagnosis not present

## 2017-03-03 ENCOUNTER — Other Ambulatory Visit: Payer: Self-pay | Admitting: *Deleted

## 2017-03-03 ENCOUNTER — Encounter: Payer: Self-pay | Admitting: Surgery

## 2017-03-03 ENCOUNTER — Ambulatory Visit (INDEPENDENT_AMBULATORY_CARE_PROVIDER_SITE_OTHER): Payer: Medicare Other | Admitting: Surgery

## 2017-03-03 VITALS — BP 106/53 | HR 92 | Temp 97.5°F | Resp 20 | Ht 65.0 in | Wt 146.0 lb

## 2017-03-03 DIAGNOSIS — I70213 Atherosclerosis of native arteries of extremities with intermittent claudication, bilateral legs: Secondary | ICD-10-CM

## 2017-03-03 DIAGNOSIS — M48061 Spinal stenosis, lumbar region without neurogenic claudication: Secondary | ICD-10-CM

## 2017-03-03 DIAGNOSIS — T82858D Stenosis of vascular prosthetic devices, implants and grafts, subsequent encounter: Secondary | ICD-10-CM | POA: Diagnosis not present

## 2017-03-03 NOTE — Progress Notes (Signed)
[facility] [place]  Patient name: Christian Bailey MRN: [id number] DOB: Jun 20, 1941 Sex: male   REASON FOR VISIT:    Follow up  HISOTRY OF PRESENT ILLNESS:   Christian Bailey a 76 y.o.malewho is status post right femoral-popliteal bypass graft with vein on 09/18/2016. This was done for claudication. Postoperatively, the patient's ABIs were below what I thought they should be and therefore he underwent angiography which revealed a widely patent bypass graft and no evidence for the reason why the ABIs were low.  He returned home and his walking improve  He recently felt that his right leg was feeling the way did before surgery.  Therefore an 01/27/2017 he was taken for angiography.  Approximately 60% stenosis of the common femoral and proximal right femoral-popliteal bypass graft was identified.  These were successfully treated using Orbital atherectomy and a 5 mm drug coated balloon.  He is back today stating that his symptoms of leg pain are not significantly improved.  PAST MEDICAL HISTORY:   Past Medical History:  Diagnosis Date  . Anemia   . Complication of anesthesia   . Diabetes mellitus ([place])    TYPE 2  . GERD (gastroesophageal reflux disease)   . GI bleed   . History of blood transfusion    GI bleed  . History of colon polyps   . History of hiatal hernia   . Hyperlipemia   . Hypertension   . Osteoarthritis   . PAD (peripheral artery disease) ([facility])    a. stenting of his left common iliac artery >20 years ago. b. h/o LEIA stent and 2 stents to R SFA in 2011. c. 04/2014:  s/p PTA of right SFA for in-stent restenosis, occluded left SFA  . PONV (postoperative nausea and vomiting)    no porblem with the last 3 surgeries  . RBBB (right bundle branch block with left anterior fascicular block)    NUCLEAR STRESS TEST, 08/18/2010 - no significant wall motion abnoramlities noted, post-stress EF 69%, normal myocardial  perfusion study  . Sinus tachycardia    a. Noted during admission 04/2014 but upon review seems to be frequent finding for patient.  . Stenosis of carotid artery    a. 50% right carotid stenosis by angiogram 04/2014.     FAMILY HISTORY:   Family History  Problem Relation Age of Onset  . Heart disease Mother   . Leukemia Mother   . Stomach cancer Father   . Esophageal cancer Brother   . Liver disease Brother   . Alcoholism Brother   . Leukemia Brother   . Leukemia Brother   . Diabetes type II Brother   . Lung disease Sister     SOCIAL HISTORY:   Social History  Substance Use Topics  . Smoking status: Former Smoker    Years: 25.00    Quit date: 06/1988  . Smokeless tobacco: Never Used  . Alcohol use Yes     Comment: rarely     ALLERGIES:   Allergies  Allergen Reactions  . Asa [Aspirin] Other (See Comments)    GI bleeding  . Oxycodone-Acetaminophen Other (See Comments)    Dizziness, uncomfortable      CURRENT MEDICATIONS:   Current Outpatient Prescriptions  Medication Sig Dispense Refill  . clopidogrel (PLAVIX) 75 MG tablet TAKE 1 TABLET DAILY 90 tablet 0  . Coenzyme Q10 (COQ10 PO) Take 1 tablet by mouth daily.    [name] glucosamine-chondroitin 500-400 MG tablet Take 1 tablet by mouth daily.     [name]  lisinopril (PRINIVIL,ZESTRIL) 5 MG tablet Take 5 mg by mouth 2 (two) times daily.     . metFORMIN (GLUCOPHAGE) 1000 MG tablet Take 1 tablet (1,000 mg total) by mouth 2 (two) times daily with a meal.    . metoprolol succinate (TOPROL-XL) 25 MG 24 hr tablet Take 25 mg by mouth 2 (two) times daily.     . Omega-3 Fatty Acids (FISH OIL) 1200 MG CAPS Take 1,200 mg by mouth every evening.     . pantoprazole (PROTONIX) 40 MG tablet Take 2 tablets (80 mg total) by mouth daily. (Patient taking differently: Take 40 mg by mouth 2 (two) times daily. ) 180 tablet 2  . rosuvastatin (CRESTOR) 20 MG tablet Take 20 mg by mouth every evening.     [name] acetaminophen (TYLENOL) 325 MG tablet  Take 325 mg by mouth daily as needed for moderate pain or headache.    . ferrous sulfate 325 (65 FE) MG EC tablet Take 1 tablet (325 mg total) by mouth 2 (two) times daily. (Patient not taking: Reported on 03/03/2017) 60 tablet 0  . methocarbamol (ROBAXIN) 500 MG tablet Take 1 tablet (500 mg total) by mouth 3 (three) times daily as needed. (Patient not taking: Reported on 01/22/2017) 60 tablet 1   No current facility-administered medications for this visit.     REVIEW OF SYSTEMS:   [X]  denotes positive finding, [ ]  denotes negative finding Cardiac  Comments:  Chest pain or chest pressure:    Shortness of breath upon exertion:    Short of breath when lying flat:    Irregular heart rhythm:        Vascular    Pain in calf, thigh, or hip brought on by ambulation:    Pain in feet at night that wakes you up from your sleep:     Blood clot in your veins:    Leg swelling:         Pulmonary    Oxygen at home:    Productive cough:     Wheezing:         Neurologic    Sudden weakness in arms or legs:     Sudden numbness in arms or legs:     Sudden onset of difficulty speaking or slurred speech:    Temporary loss of vision in one eye:     Problems with dizziness:         Gastrointestinal    Blood in stool:     Vomited blood:         Genitourinary    Burning when urinating:     Blood in urine:        Psychiatric    Major depression:         Hematologic    Bleeding problems:    Problems with blood clotting too easily:        Skin    Rashes or ulcers:        Constitutional    Fever or chills:      PHYSICAL EXAM:   Vitals:   03/03/17 1610  BP: (!) 106/53  Pulse: 92  Resp: 20  Temp: (!) 97.5 F (36.4 C)  TempSrc: Oral  SpO2: 97%  Weight: 146 lb (66.2 kg)  Height: 5\' 5"  (1.651 m)    GENERAL: The patient is a well-nourished male, in no acute distress. The vital signs are documented above. CARDIAC: There is a regular rate and rhythm.  VASCULAR: Palpable right dorsalis  pedis and posterior  tibial artery PULMONARY: Non-labored respirations ABDOMEN: Soft and non-tender with normal pitched bowel sounds.  MUSCULOSKELETAL: There are no major deformities or cyanosis. NEUROLOGIC: No focal weakness or paresthesias are detected. SKIN: There are no ulcers or rashes noted. PSYCHIATRIC: The patient has a normal affect.  STUDIES:   I have reviewed his vascular studies today.  This shows the ABI on the right is 0.87 and 0.65 on the left.  Duplex shows a widely patent stent with no stenosis  MEDICAL ISSUES:   Persistent bilateral leg pain despite surgical revascularization of the right leg which is confirmed to be widely patent by angiography and duplex.  Because the patient has normal blood flow to the right leg yet still has significant bilateral symptoms, I wonder if he is suffering from spinal stenosis or another lower back process.  I am ordering x-rays of his back today to get the process started.  He'll most likely require a MRI in the future.  He is scheduled to see Dr. Dagmar Hait next week.  He will follow with me in 3 months with duplex and ABIs.    [name], MD [facility] [phone] Pager [phone]

## 2017-03-04 ENCOUNTER — Ambulatory Visit
Admission: RE | Admit: 2017-03-04 | Discharge: 2017-03-04 | Disposition: A | Discharge status: Home / Self Care | Payer: Medicare Other | Source: Ambulatory Visit | Attending: Surgery | Admitting: Surgery

## 2017-03-04 DIAGNOSIS — M48061 Spinal stenosis, lumbar region without neurogenic claudication: Secondary | ICD-10-CM

## 2017-03-04 NOTE — Addendum Note (Signed)
Addended by: Lianne Cure A on: 03/04/2017 10:12 AM   Modules accepted: Orders

## 2017-03-05 DIAGNOSIS — I2510 Atherosclerotic heart disease of native coronary artery without angina pectoris: Secondary | ICD-10-CM | POA: Diagnosis not present

## 2017-03-05 DIAGNOSIS — E784 Other hyperlipidemia: Secondary | ICD-10-CM | POA: Diagnosis not present

## 2017-03-05 DIAGNOSIS — M6281 Muscle weakness (generalized): Secondary | ICD-10-CM | POA: Diagnosis not present

## 2017-03-05 DIAGNOSIS — I10 Essential (primary) hypertension: Secondary | ICD-10-CM | POA: Diagnosis not present

## 2017-03-05 DIAGNOSIS — Z471 Aftercare following joint replacement surgery: Secondary | ICD-10-CM | POA: Diagnosis not present

## 2017-03-05 DIAGNOSIS — Z96611 Presence of right artificial shoulder joint: Secondary | ICD-10-CM | POA: Diagnosis not present

## 2017-03-05 DIAGNOSIS — E1151 Type 2 diabetes mellitus with diabetic peripheral angiopathy without gangrene: Secondary | ICD-10-CM | POA: Diagnosis not present

## 2017-03-05 DIAGNOSIS — Z125 Encounter for screening for malignant neoplasm of prostate: Secondary | ICD-10-CM | POA: Diagnosis not present

## 2017-03-09 DIAGNOSIS — E1151 Type 2 diabetes mellitus with diabetic peripheral angiopathy without gangrene: Secondary | ICD-10-CM | POA: Diagnosis not present

## 2017-03-09 DIAGNOSIS — Z96611 Presence of right artificial shoulder joint: Secondary | ICD-10-CM | POA: Diagnosis not present

## 2017-03-09 DIAGNOSIS — I2510 Atherosclerotic heart disease of native coronary artery without angina pectoris: Secondary | ICD-10-CM | POA: Diagnosis not present

## 2017-03-09 DIAGNOSIS — Z471 Aftercare following joint replacement surgery: Secondary | ICD-10-CM | POA: Diagnosis not present

## 2017-03-09 DIAGNOSIS — I10 Essential (primary) hypertension: Secondary | ICD-10-CM | POA: Diagnosis not present

## 2017-03-09 DIAGNOSIS — M6281 Muscle weakness (generalized): Secondary | ICD-10-CM | POA: Diagnosis not present

## 2017-03-11 ENCOUNTER — Encounter: Payer: Self-pay | Admitting: Physician Assistant

## 2017-03-11 ENCOUNTER — Other Ambulatory Visit: Payer: Self-pay | Admitting: Internal Medicine

## 2017-03-11 DIAGNOSIS — Z0000 Encounter for general adult medical examination without abnormal findings: Secondary | ICD-10-CM | POA: Diagnosis not present

## 2017-03-11 DIAGNOSIS — E784 Other hyperlipidemia: Secondary | ICD-10-CM | POA: Diagnosis not present

## 2017-03-11 DIAGNOSIS — Z1389 Encounter for screening for other disorder: Secondary | ICD-10-CM | POA: Diagnosis not present

## 2017-03-11 DIAGNOSIS — M79605 Pain in left leg: Secondary | ICD-10-CM

## 2017-03-11 DIAGNOSIS — I10 Essential (primary) hypertension: Secondary | ICD-10-CM | POA: Diagnosis not present

## 2017-03-11 DIAGNOSIS — I2510 Atherosclerotic heart disease of native coronary artery without angina pectoris: Secondary | ICD-10-CM | POA: Diagnosis not present

## 2017-03-11 DIAGNOSIS — M4810 Ankylosing hyperostosis [Forestier], site unspecified: Secondary | ICD-10-CM | POA: Diagnosis not present

## 2017-03-11 DIAGNOSIS — M1990 Unspecified osteoarthritis, unspecified site: Secondary | ICD-10-CM | POA: Diagnosis not present

## 2017-03-11 DIAGNOSIS — M79604 Pain in right leg: Secondary | ICD-10-CM

## 2017-03-11 DIAGNOSIS — D62 Acute posthemorrhagic anemia: Secondary | ICD-10-CM | POA: Diagnosis not present

## 2017-03-11 DIAGNOSIS — E1151 Type 2 diabetes mellitus with diabetic peripheral angiopathy without gangrene: Secondary | ICD-10-CM | POA: Diagnosis not present

## 2017-03-11 DIAGNOSIS — I7389 Other specified peripheral vascular diseases: Secondary | ICD-10-CM | POA: Diagnosis not present

## 2017-03-11 DIAGNOSIS — Z6824 Body mass index (BMI) 24.0-24.9, adult: Secondary | ICD-10-CM | POA: Diagnosis not present

## 2017-03-11 DIAGNOSIS — Z23 Encounter for immunization: Secondary | ICD-10-CM | POA: Diagnosis not present

## 2017-03-15 ENCOUNTER — Ambulatory Visit
Admission: RE | Admit: 2017-03-15 | Discharge: 2017-03-15 | Disposition: A | Discharge status: Home / Self Care | Payer: Medicare Other | Source: Ambulatory Visit | Attending: Internal Medicine | Admitting: Internal Medicine

## 2017-03-15 DIAGNOSIS — M4810 Ankylosing hyperostosis [Forestier], site unspecified: Secondary | ICD-10-CM

## 2017-03-15 DIAGNOSIS — M79605 Pain in left leg: Secondary | ICD-10-CM

## 2017-03-15 DIAGNOSIS — M48061 Spinal stenosis, lumbar region without neurogenic claudication: Secondary | ICD-10-CM | POA: Diagnosis not present

## 2017-03-15 DIAGNOSIS — M79604 Pain in right leg: Secondary | ICD-10-CM

## 2017-03-15 MED ORDER — GADOBENATE DIMEGLUMINE 529 MG/ML IV SOLN
13.0000 mL | Freq: Once | INTRAVENOUS | Status: AC | PRN
  Administered 2017-03-15: 13 mL via INTRAVENOUS

## 2017-03-24 ENCOUNTER — Ambulatory Visit: Payer: Medicare Other | Admitting: Physician Assistant

## 2017-03-24 DIAGNOSIS — H353222 Exudative age-related macular degeneration, left eye, with inactive choroidal neovascularization: Secondary | ICD-10-CM | POA: Diagnosis not present

## 2017-03-24 DIAGNOSIS — H353212 Exudative age-related macular degeneration, right eye, with inactive choroidal neovascularization: Secondary | ICD-10-CM | POA: Diagnosis not present

## 2017-03-24 DIAGNOSIS — H353124 Nonexudative age-related macular degeneration, left eye, advanced atrophic with subfoveal involvement: Secondary | ICD-10-CM | POA: Diagnosis not present

## 2017-03-24 DIAGNOSIS — H353113 Nonexudative age-related macular degeneration, right eye, advanced atrophic without subfoveal involvement: Secondary | ICD-10-CM | POA: Diagnosis not present

## 2017-03-25 DIAGNOSIS — E1351 Other specified diabetes mellitus with diabetic peripheral angiopathy without gangrene: Secondary | ICD-10-CM | POA: Diagnosis not present

## 2017-03-25 DIAGNOSIS — M79672 Pain in left foot: Secondary | ICD-10-CM | POA: Diagnosis not present

## 2017-03-25 DIAGNOSIS — I739 Peripheral vascular disease, unspecified: Secondary | ICD-10-CM | POA: Diagnosis not present

## 2017-03-25 DIAGNOSIS — L602 Onychogryphosis: Secondary | ICD-10-CM | POA: Diagnosis not present

## 2017-03-25 DIAGNOSIS — M79671 Pain in right foot: Secondary | ICD-10-CM | POA: Diagnosis not present

## 2017-03-26 ENCOUNTER — Telehealth: Payer: Self-pay | Admitting: *Deleted

## 2017-03-26 ENCOUNTER — Encounter: Payer: Self-pay | Admitting: Physician Assistant

## 2017-03-26 ENCOUNTER — Ambulatory Visit (INDEPENDENT_AMBULATORY_CARE_PROVIDER_SITE_OTHER): Payer: Medicare Other | Admitting: Physician Assistant

## 2017-03-26 VITALS — BP 108/52 | HR 66 | Ht 65.0 in | Wt 149.0 lb

## 2017-03-26 DIAGNOSIS — D509 Iron deficiency anemia, unspecified: Secondary | ICD-10-CM | POA: Diagnosis not present

## 2017-03-26 DIAGNOSIS — Z86010 Personal history of colonic polyps: Secondary | ICD-10-CM

## 2017-03-26 DIAGNOSIS — Z8711 Personal history of peptic ulcer disease: Secondary | ICD-10-CM

## 2017-03-26 DIAGNOSIS — Z7901 Long term (current) use of anticoagulants: Secondary | ICD-10-CM

## 2017-03-26 MED ORDER — SUPREP BOWEL PREP KIT 17.5-3.13-1.6 GM/177ML PO SOLN: 1.0000 | ORAL | 0 refills | Status: DC

## 2017-03-26 NOTE — Progress Notes (Signed)
Chief Complaint: Anemia  HPI:  [name] is a 76 year old Caucasian male with a past medical history of reflux, PUD and tubular adenomas, as well as others listed below including PAD maintained on Plavix, who was referred to me by Prince Solian, MD for a complaint of anemia .      Patient was previously followed in clinic by Dr. Deatra Ina. His last colonoscopy was 03/15/12 with findings of a sessile polyp measuring 5 mm in size in the cecum, mild diverticulosis and otherwise normal. This was found to be a tubular adenoma and patient was recommended to repeat in 5 years.    Per review of referring physician's notes patient recently had labs on 03/11/17 which continued to show a hemoglobin low at 10.2. CMP at that time was normal. Previous labs completed 01/15/17 showed a hemoglobin of 10.4.    Today, the patient describes that he has had 2 recent surgeries for PVD within the past 6 months and tells me that after the first operation he was told he was anemic. He was started on iron and has been continued due to a continually low hemoglobin since that time. Patient tells me has also lost around 15 pounds. He does recall history of a PUD and being seen in the hospital for this with an endoscopy. He is maintained on Pantoprazole 40 mg twice a day and denies symptoms of epigastric pain or reflux.   Patient denies fever, chills, seen bright red blood in his stool, melena, change in bowel habits, fatigue, anorexia, nausea, vomiting or abdominal pain.     Past Medical History:  Diagnosis Date  . Anemia   . Complication of anesthesia   . Diabetes mellitus ([place])    TYPE 2  . GERD (gastroesophageal reflux disease)   . GI bleed   . History of blood transfusion    GI bleed  . History of colon polyps   . History of hiatal hernia   . Hyperlipemia   . Hypertension   . Osteoarthritis   . PAD (peripheral artery disease) (HCC)    a. stenting of his left common iliac artery >20 years ago. b. h/o LEIA stent and  2 stents to R SFA in 2011. c. 04/2014:  s/p PTA of right SFA for in-stent restenosis, occluded left SFA  . PONV (postoperative nausea and vomiting)    no porblem with the last 3 surgeries  . RBBB (right bundle branch block with left anterior fascicular block)    NUCLEAR STRESS TEST, 08/18/2010 - no significant wall motion abnoramlities noted, post-stress EF 69%, normal myocardial perfusion study  . Sinus tachycardia    a. Noted during admission 04/2014 but upon review seems to be frequent finding for patient.  . Stenosis of carotid artery    a. 50% right carotid stenosis by angiogram 04/2014.    Past Surgical History:  Procedure Laterality Date  . ABDOMINAL AORTOGRAM W/LOWER EXTREMITY N/A 01/27/2017   Procedure: Abdominal Aortogram w/Lower Extremity;  Surgeon: Serafina Mitchell, MD;  Location: [place] [facility];  Service: Cardiovascular;  Laterality: N/A;  . ANGIOPLASTY / STENTING FEMORAL    . ANGIOPLASTY / STENTING ILIAC    . CEREBRAL ANGIOGRAM N/A 05/14/2014   Procedure: CEREBRAL ANGIOGRAM;  Surgeon: Lorretta Harp, MD;  Location: [facility] CATH LAB;  Service: Cardiovascular;  Laterality: N/A;  . COLONOSCOPY W/ POLYPECTOMY    . EYE SURGERY Bilateral    cataract  . FEMORAL ARTERY STENT Right 05/12/2010   Stented distally with a  6x100 Abbott absolute stent and proximally with a 6x60 Cook Zilver stent resulting in the reduction of the proximal segment 80% and mid segment 60-70% to 0% residual, LEFT common femoral artery stented with a 7x3 Smart stent resulting in reduction of 90% stenosis to 0% residual  . FEMORAL-POPLITEAL BYPASS GRAFT Right 09/18/2016   Procedure: BYPASS GRAFT FEMORAL-POPLITEAL ARTERY;  Surgeon: Serafina Mitchell, MD;  Location: [place];  Service: Vascular;  Laterality: Right;  . KNEE ARTHROSCOPY     left  . LOWER EXTREMITY ANGIOGRAM N/A 05/14/2014   Procedure: LOWER EXTREMITY ANGIOGRAM;  Surgeon: Lorretta Harp, MD;  Location: [facility] CATH LAB;  Service: Cardiovascular;  Laterality:  N/A;  . LOWER EXTREMITY ANGIOGRAPHY N/A 09/21/2016   Procedure: Lower Extremity Angiography;  Surgeon: Conrad [place]ville, MD;  Location: [place] [facility];  Service: Cardiovascular;  Laterality: N/A;  . PERIPHERAL VASCULAR ATHERECTOMY Right 01/27/2017   Procedure: PERIPHERAL VASCULAR ATHERECTOMY;  Surgeon: Serafina Mitchell, MD;  Location: [place] [facility];  Service: Cardiovascular;  Laterality: Right;  . PERIPHERAL VASCULAR CATHETERIZATION N/A 04/20/2016   Procedure: Lower Extremity Intervention;  Surgeon: Lorretta Harp, MD;  Location: [place] [facility];  Service: Cardiovascular;  Laterality: N/A;  . REVERSE SHOULDER ARTHROPLASTY Right 12/07/2016  . REVERSE SHOULDER ARTHROPLASTY Right 12/07/2016   Procedure: REVERSE SHOULDER ARTHROPLASTY;  Surgeon: Netta Cedars, MD;  Location: [place];  Service: Orthopedics;  Laterality: Right;  . ROTATOR CUFF REPAIR Right 2003  . SFA Right 05/14/2014   PTA  OF RT SFA         DR BERRY    Current Outpatient Prescriptions  Medication Sig Dispense Refill  . acetaminophen (TYLENOL) 325 MG tablet Take 325 mg by mouth daily as needed for moderate pain or headache.    . clopidogrel (PLAVIX) 75 MG tablet TAKE 1 TABLET DAILY 90 tablet 0  . Coenzyme Q10 (COQ10 PO) Take 1 tablet by mouth daily.    . ferrous sulfate 325 (65 FE) MG EC tablet Take 1 tablet (325 mg total) by mouth 2 (two) times daily. 60 tablet 0  . glucosamine-chondroitin 500-400 MG tablet Take 1 tablet by mouth daily.     [name] lisinopril (PRINIVIL,ZESTRIL) 5 MG tablet Take 5 mg by mouth 2 (two) times daily.     . metFORMIN (GLUCOPHAGE) 1000 MG tablet Take 1 tablet (1,000 mg total) by mouth 2 (two) times daily with a meal.    . metoprolol succinate (TOPROL-XL) 25 MG 24 hr tablet Take 25 mg by mouth 2 (two) times daily.     . Omega-3 Fatty Acids (FISH OIL) 1200 MG CAPS Take 1,200 mg by mouth every evening.     . pantoprazole (PROTONIX) 40 MG tablet Take 2 tablets (80 mg total) by mouth daily. (Patient taking  differently: Take 40 mg by mouth 2 (two) times daily. ) 180 tablet 2  . rosuvastatin (CRESTOR) 20 MG tablet Take 20 mg by mouth every evening.      No current facility-administered medications for this visit.     Allergies as of 03/26/2017 - Review Complete 03/03/2017  Allergen Reaction Noted  . Asa [aspirin] Other (See Comments) 04/26/2014  . Gadolinium derivatives Other (See Comments) 03/15/2017  . Oxycodone-acetaminophen Other (See Comments) 09/18/2016    Family History  Problem Relation Age of Onset  . Heart disease Mother   . Leukemia Mother   . Stomach cancer Father   . Esophageal cancer Brother   . Liver disease Brother   .  Alcoholism Brother   . Leukemia Brother   . Leukemia Brother   . Diabetes type II Brother   . Lung disease Sister     Social History   Social History  . Marital status: Married    Spouse name: N/A  . Number of children: 2  . Years of education: N/A   Occupational History  . retired   .  Retired   Social History Main Topics  . Smoking status: Former Smoker    Years: 25.00    Quit date: 06/1988  . Smokeless tobacco: Never Used  . Alcohol use Yes     Comment: rarely  . Drug use: No  . Sexual activity: Not on file   Other Topics Concern  . Not on file   Social History Narrative  . No narrative on file    Review of Systems:    Constitutional: No weight loss, fever or chills Skin: No rash  Cardiovascular: No chest pain Respiratory: No SOB Gastrointestinal: See HPI and otherwise negative Genitourinary: No dysuria  Neurological: No headache Musculoskeletal: No new muscle or joint pain Hematologic: No bleeding  Psychiatric: No history of depression or anxiety   Physical Exam:  Vital signs: BP (!) 108/52   Pulse 66   Ht 5\' 5"  (1.651 m)   Wt 149 lb (67.6 kg)   BMI 24.79 kg/m    Constitutional:   Pleasant Caucasian male appears to be in NAD, Well developed, Well nourished, alert and cooperative Head:  Normocephalic and  atraumatic. Eyes:   PEERL, EOMI. No icterus. Conjunctiva pink. Ears:  Normal auditory acuity. Neck:  Supple Throat: Oral cavity and pharynx without inflammation, swelling or lesion.  Respiratory: Respirations even and unlabored. Lungs clear to auscultation bilaterally.   No wheezes, crackles, or rhonchi.  Cardiovascular: Normal S1, S2. No MRG. Regular rate and rhythm. No peripheral edema, cyanosis or pallor.  Gastrointestinal:  Soft, nondistended, nontender. No rebound or guarding. Normal bowel sounds. No appreciable masses or hepatomegaly. Rectal:  Not performed.  Msk:  Symmetrical without gross deformities. Without edema, no deformity or joint abnormality.  Neurologic:  Alert and  oriented x4;  grossly normal neurologically.  Skin:   Dry and intact without significant lesions or rashes. Psychiatric:  Demonstrates good judgement and reason without abnormal affect or behaviors.  See HPI for most recent labs.  Assessment: 1. Anemia: Suspected iron deficiency anemia by PCP, patient is currently on iron supplementation, last hemoglobin in the 10's, and has remained low over the past few months per records; consider relation to recent surgeries but with history of PUD as well as tubular adenomas will also consider upper versus lower GI source of blood loss 2. History of tubular adenoma 3. History of PUD & GERD 4. Chronic Anticoagulation: with Plavix for PAD s/p stent placement  Plan: 1. Scheduled patient for an EGD and colonoscopy in the [place] with Dr. Loletha Carrow. Did discuss risks, benefits, limitations and alternatives and the patient agrees proceed. 2. Patient to continue his iron as previously prescribed by his PCP and hold prior to colo per recs 3. I advised the patient hold his Plavix for 5 days prior to his procedure. We will communicate with his prescribing physician to ensure that holding his Plavix is acceptable. 4. Patient to follow in clinic per recommendations from Dr. Loletha Carrow after time of  procedure.  Ellouise Newer, PA-C [facility] 03/26/2017, 9:24 AM  Cc: Prince Solian, MD

## 2017-03-26 NOTE — Telephone Encounter (Signed)
   Christian Bailey 03-14-1941 [id number]  Dear Dr Gwenlyn Found:  We have scheduled the above named patient for a(n) endoscopy/colonoscopy procedure. Our records show that (s)he is on anticoagulation therapy.  Please advise as to whether the patient may come off their therapy of plavix 5 days prior to their procedure which is scheduled for 05/10/17.  Please route your response to Dixon Boos, CMA.  Sincerely, [facility]

## 2017-03-26 NOTE — Patient Instructions (Addendum)
You have been scheduled for an endoscopy and colonoscopy. Please follow the written instructions given to you at your visit today. Please pick up your prep supplies at the pharmacy within the next 1-3 days. If you use inhalers (even only as needed), please bring them with you on the day of your procedure. Your physician has requested that you go to www.startemmi.com and enter the access code given to you at your visit today. This web site gives a general overview about your procedure. However, you should still follow specific instructions given to you by our office regarding your preparation for the procedure.  You will be contacted by our office prior to your procedure for directions on holding your Plavix.  If you do not hear from our office 1 week prior to your scheduled procedure, please call [phone] to discuss.   Follow up with Dr Loletha Carrow per recommendations after your endoscopy and colonoscopy.  If you are age 81 or older, your body mass index should be between 23-30. Your Body mass index is 24.79 kg/m. If this is out of the aforementioned range listed, please consider follow up with your Primary Care Provider.  If you are age 47 or younger, your body mass index should be between 19-25. Your Body mass index is 24.79 kg/m. If this is out of the aformentioned range listed, please consider follow up with your Primary Care Provider.

## 2017-03-26 NOTE — Addendum Note (Signed)
Addended by: Larina Bras on: 03/26/2017 09:55 AM   Modules accepted: Orders

## 2017-03-26 NOTE — Progress Notes (Signed)
Thank you for sending this case to me. I have reviewed the entire note, and the outlined plan seems appropriate.   Henry Danis, MD  

## 2017-03-31 ENCOUNTER — Other Ambulatory Visit: Payer: Self-pay | Admitting: Cardiovascular Disease

## 2017-03-31 DIAGNOSIS — I739 Peripheral vascular disease, unspecified: Secondary | ICD-10-CM

## 2017-04-01 NOTE — Telephone Encounter (Signed)
OK to interrupt antiplatelet Rx for EGD 

## 2017-04-05 NOTE — Telephone Encounter (Signed)
Routing to Dixon Boos, [place]

## 2017-04-05 NOTE — Telephone Encounter (Signed)
Informed patient he can hold Plavix 5 days prior to his procedure per Dr. Gwenlyn Found. Patient verbalized understanding.

## 2017-04-12 ENCOUNTER — Encounter: Payer: Self-pay | Admitting: Gastroenterology

## 2017-04-14 DIAGNOSIS — M48062 Spinal stenosis, lumbar region with neurogenic claudication: Secondary | ICD-10-CM | POA: Diagnosis not present

## 2017-04-27 DIAGNOSIS — D62 Acute posthemorrhagic anemia: Secondary | ICD-10-CM | POA: Diagnosis not present

## 2017-05-10 ENCOUNTER — Encounter: Payer: Medicare Other | Admitting: Gastroenterology

## 2017-05-11 DIAGNOSIS — M5416 Radiculopathy, lumbar region: Secondary | ICD-10-CM | POA: Diagnosis not present

## 2017-05-11 DIAGNOSIS — M48061 Spinal stenosis, lumbar region without neurogenic claudication: Secondary | ICD-10-CM | POA: Diagnosis not present

## 2017-05-11 DIAGNOSIS — M5136 Other intervertebral disc degeneration, lumbar region: Secondary | ICD-10-CM | POA: Diagnosis not present

## 2017-05-11 DIAGNOSIS — M4726 Other spondylosis with radiculopathy, lumbar region: Secondary | ICD-10-CM | POA: Diagnosis not present

## 2017-05-12 ENCOUNTER — Encounter (HOSPITAL_COMMUNITY): Payer: Medicare Other

## 2017-05-17 ENCOUNTER — Other Ambulatory Visit: Payer: Self-pay

## 2017-05-17 ENCOUNTER — Telehealth: Payer: Self-pay | Admitting: Gastroenterology

## 2017-05-17 ENCOUNTER — Other Ambulatory Visit (HOSPITAL_COMMUNITY): Payer: Self-pay

## 2017-05-17 MED ORDER — SUPREP BOWEL PREP KIT 17.5-3.13-1.6 GM/177ML PO SOLN: 1.0000 | ORAL | 0 refills | Status: DC

## 2017-05-17 NOTE — Telephone Encounter (Signed)
Rx sent as requested.

## 2017-05-18 ENCOUNTER — Ambulatory Visit (HOSPITAL_COMMUNITY)
Admission: RE | Admit: 2017-05-18 | Discharge: 2017-05-18 | Disposition: A | Discharge status: Home / Self Care | Payer: Medicare Other | Source: Ambulatory Visit | Attending: Internal Medicine | Admitting: Internal Medicine

## 2017-05-18 DIAGNOSIS — D649 Anemia, unspecified: Secondary | ICD-10-CM | POA: Diagnosis not present

## 2017-05-18 MED ORDER — SODIUM CHLORIDE 0.9 % IV SOLN
510.0000 mg | INTRAVENOUS | Status: DC
  Administered 2017-05-18: 510 mg via INTRAVENOUS
  Filled 2017-05-18: qty 17

## 2017-05-18 NOTE — Discharge Instructions (Signed)

## 2017-05-24 ENCOUNTER — Encounter: Payer: Medicare Other | Admitting: Gastroenterology

## 2017-05-25 ENCOUNTER — Ambulatory Visit (HOSPITAL_COMMUNITY)
Admission: RE | Admit: 2017-05-25 | Discharge: 2017-05-25 | Disposition: A | Discharge status: Home / Self Care | Payer: Medicare Other | Source: Ambulatory Visit | Attending: Internal Medicine | Admitting: Internal Medicine

## 2017-05-25 DIAGNOSIS — D649 Anemia, unspecified: Secondary | ICD-10-CM | POA: Insufficient documentation

## 2017-05-25 MED ORDER — SODIUM CHLORIDE 0.9 % IV SOLN
510.0000 mg | INTRAVENOUS | Status: AC
  Administered 2017-05-25: 09:00:00 510 mg via INTRAVENOUS
  Filled 2017-05-25: qty 17

## 2017-05-28 ENCOUNTER — Ambulatory Visit (INDEPENDENT_AMBULATORY_CARE_PROVIDER_SITE_OTHER): Payer: Medicare Other | Admitting: Cardiovascular Disease

## 2017-05-28 ENCOUNTER — Encounter: Payer: Self-pay | Admitting: Cardiovascular Disease

## 2017-05-28 VITALS — BP 94/52 | HR 91 | Ht 64.0 in | Wt 144.0 lb

## 2017-05-28 DIAGNOSIS — E7800 Pure hypercholesterolemia, unspecified: Secondary | ICD-10-CM | POA: Diagnosis not present

## 2017-05-28 DIAGNOSIS — I10 Essential (primary) hypertension: Secondary | ICD-10-CM | POA: Diagnosis not present

## 2017-05-28 DIAGNOSIS — I6521 Occlusion and stenosis of right carotid artery: Secondary | ICD-10-CM | POA: Diagnosis not present

## 2017-05-28 DIAGNOSIS — I452 Bifascicular block: Secondary | ICD-10-CM | POA: Diagnosis not present

## 2017-05-28 DIAGNOSIS — I739 Peripheral vascular disease, unspecified: Secondary | ICD-10-CM | POA: Diagnosis not present

## 2017-05-28 NOTE — Assessment & Plan Note (Signed)
Chronic. 

## 2017-05-28 NOTE — Assessment & Plan Note (Signed)
History of carotid artery disease with recent Doppler performed 06/23/16 revealing moderately severe right ICA stenosis. We will reschedule carotid Doppler studies.

## 2017-05-28 NOTE — Patient Instructions (Signed)
Medication Instructions: Your physician recommends that you continue on your current medications as directed. Please refer to the Current Medication list given to you today.  Testing/Procedures: Your physician has requested that you have a carotid duplex. This test is an ultrasound of the carotid arteries in your neck. It looks at blood flow through these arteries that supply the brain with blood. Allow one hour for this exam. There are no restrictions or special instructions.  Follow-Up: Your physician wants you to follow-up in: 1 year with Dr. Berry. You will receive a reminder letter in the mail two months in advance. If you don't receive a letter, please call our office to schedule the follow-up appointment.  If you need a refill on your cardiac medications before your next appointment, please call your pharmacy.  

## 2017-05-28 NOTE — Progress Notes (Signed)
05/28/2017 Christian Bailey   Aug 21, 1940  [id number]  Primary Physician Prince Solian, MD Primary Cardiologist: Lorretta Harp MD [name], [place]  HPI:  Christian Bailey is a 76 y.o. male married Caucasian male, father of 2, grandfather to 1 grandchild who I last saw  05/27/16. Christian KitchenHis daughter-in-law is a primary care physician at [employer]. He has a history of PVOD and stenting of his left common iliac artery 20 years ago. He was smoking 3 packs a day at that time but stopped smoking 20 years ago.   His other problems include hypertension, hyperlipidemia and noninsulin-requiring diabetes. He had a negative Myoview in our office August 18, 2010. Because of claudication, I performed angiograph on him May 12, 2010, revealing an intact left common iliac artery stent, high-grade left external iliac artery stenosis which I stented using a 7 mm x 3 cm long Nitinol self-expanding stent. I also put 2 stents in his right SFA. Followup Dopplers showed improvement but symptomatically he did not really improve much. He still complains of predictable bilateral lower extremity claudication at several hundred yards. His Dopplers have remained stable. Dr. Dagmar Hait follows his lipid profile which was recently done earlier this month revealing a total cholesterol of 153, LDL of 86 and HDL of 34. Recent lower with Doppler studies performed in October 2014 revealed patent stents with a right ABI of 0.81 and a left of 0.67.over the past year he's noticed progressive but without limiting claudication with recent Dopplers performed 04/04/14 revealing a decline in his right ABI from 0.81-0.53 with progression of disease in his right SFA suggesting "in-stent restenosis.I also performed carotid Doppler studies that suggested moderately severe right internal carotid artery stenosis.   As a result of these studies I performed angiography on him 05/14/14 revealing a patent left external iliac artery stent with at most  30-40% "in-stent restenosis, and known occluded left SFA with two-vessel runoff (occluded posterior tibial), patent proximal right SFA stent with diffuse "95% in-stent restenosis" within the distal right SFA stent. He had three-vessel runoff on the right. I performed a drug eluting balloon angioplasty for the in-stent restenosis with excellent angiographic and Doppler result although his symptoms did not significantly improve. I also angiogram his carotids because of a moderately severe right ICA stenosis by duplex ultrasound revealing a 50% hypodense right internal carotid artery stenosis with a bovine arch. We have continued to follow these noninvasively. He is neurologically asymptomatic. He denies chest pain or shortness of breath. Since I saw him a year [name] has noticed increasing claudication bilaterally. We obtained lower extremity Dopplers 12/09/15 revealing a decline in his right ABI from 0.85 down to 0.68 and occluded distal right SFA. We did begin him on Pletal which she has taken for last several months with minimal benefit. I angiogram 1010/23/17 revealing a widely patent left iliac stent, occluded left SFA with two-vessel runoff and a occluded right SFA with two-vessel runoff. I did not think he had percutaneous options for revascularization and he therefore underwent right femoropopliteal bypass grafting by [name] in March of this year. He's been reinjured them twice demonstrating a patent graft despite the fact that his symptoms have gotten worse. He is also had a reverse right shoulder replacement by Dr. Veverly Fells in June of this year. He denies chest pain or shortness of breath.     Current Meds  Medication Sig  . clopidogrel (PLAVIX) 75 MG tablet TAKE 1 TABLET DAILY  . Coenzyme Q10 (COQ10 PO) Take  1 tablet by mouth daily.  Christian Bailey glucosamine-chondroitin 500-400 MG tablet Take 1 tablet by mouth daily.   Christian Bailey lisinopril (PRINIVIL,ZESTRIL) 5 MG tablet Take 5 mg by mouth 2 (two) times daily.   .  metFORMIN (GLUCOPHAGE) 1000 MG tablet Take 1 tablet (1,000 mg total) by mouth 2 (two) times daily with a meal.  . metoprolol succinate (TOPROL-XL) 25 MG 24 hr tablet Take 25 mg by mouth 2 (two) times daily.   . Omega-3 Fatty Acids (FISH OIL) 1200 MG CAPS Take 1,200 mg by mouth every evening.   . pantoprazole (PROTONIX) 40 MG tablet Take 2 tablets (80 mg total) by mouth daily. (Patient taking differently: Take 40 mg by mouth 2 (two) times daily. )  . rosuvastatin (CRESTOR) 20 MG tablet Take 20 mg by mouth every evening.   Christian Bailey SUPREP BOWEL PREP KIT 17.5-3.13-1.6 GM/177ML SOLN Take 1 kit as directed by mouth.     Allergies  Allergen Reactions  . Asa [Aspirin] Other (See Comments)    GI bleeding  . Gadolinium Derivatives Other (See Comments)    Pt complained of face flushing/hottness and throat tightness/scratchiness immediately after the injections.  Within 4 minutes, all symptoms were gone and the study was completed.  No further complications or signs of allergy were exhibited after completion of study.   . Oxycodone-Acetaminophen Other (See Comments)    Dizziness, uncomfortable     Social History   Socioeconomic History  . Marital status: Married    Spouse name: Not on file  . Number of children: 2  . Years of education: Not on file  . Highest education level: Not on file  Social Needs  . Financial resource strain: Not on file  . Food insecurity - worry: Not on file  . Food insecurity - inability: Not on file  . Transportation needs - medical: Not on file  . Transportation needs - non-medical: Not on file  Occupational History  . Occupation: retired    Fish farm manager: RETIRED  Tobacco Use  . Smoking status: Former Smoker    Years: 25.00    Last attempt to quit: 06/1988    Years since quitting: 28.9  . Smokeless tobacco: Never Used  Substance and Sexual Activity  . Alcohol use: Yes    Comment: rarely  . Drug use: No  . Sexual activity: Not on file  Other Topics Concern  . Not on  file  Social History Narrative  . Not on file     Review of Systems: General: negative for chills, fever, night sweats or weight changes.  Cardiovascular: negative for chest pain, dyspnea on exertion, edema, orthopnea, palpitations, paroxysmal nocturnal dyspnea or shortness of breath Dermatological: negative for rash Respiratory: negative for cough or wheezing Urologic: negative for hematuria Abdominal: negative for nausea, vomiting, diarrhea, bright red blood per rectum, melena, or hematemesis Neurologic: negative for visual changes, syncope, or dizziness All other systems reviewed and are otherwise negative except as noted above.    Blood pressure (!) 94/52, pulse 91, height '5\' 4"'$  (1.626 m), weight 144 lb (65.3 kg).  General appearance: alert and no distress Neck: no adenopathy, no JVD, supple, symmetrical, trachea midline, thyroid not enlarged, symmetric, no tenderness/mass/nodules and Soft right carotid bruit Lungs: clear to auscultation bilaterally Heart: regular rate and rhythm, S1, S2 normal, no murmur, click, rub or gallop Extremities: extremities normal, atraumatic, no cyanosis or edema Pulses: Absent left pedal pulse Skin: Skin color, texture, turgor normal. No rashes or lesions Neurologic: Alert and oriented X 3,  normal strength and tone. Normal symmetric reflexes. Normal coordination and gait  EKG sinus rhythm at 91 with right bundle branch block and left axis deviation. I personally reviewed this EKG.  ASSESSMENT AND PLAN:   PAD (peripheral artery disease), hx remote stent Lt common iliac, 04/2010-stent to lt common iliac & 2 stents to the Rt SFA, 04/2014 - s/p PTA of right SFA for in-stent restenosis  History of PAD status post remote right SFA stenting by myself as well as left iliac stenting. The last angiogram performed on him was 04/20/16 revealed a patent left external iliac artery stent with occluded SFAs bilaterally and three-vessel runoff on the right and some on  the left. He possibly underwent right femoropopliteal bypass grafting by [name] March of this year and has been re-angiogram twice demonstrating a patent graft despite the fact that his symptoms have been worse. The possibility of a spinal issue was mentioned and he has undergone neurosurgical evaluation as well.  RBBB (right bundle branch block with left anterior fascicular block) Chronic  Essential hypertension History of essential hypertension blood pressure measured 94/52. He is on lisinopril and metoprolol. Continue current meds are current dosing.  Hyperlipidemia History of hyperlipidemia on statin therapy followed by PCP  Carotid artery disease History of carotid artery disease with recent Doppler performed 06/23/16 revealing moderately severe right ICA stenosis. We will reschedule carotid Doppler studies.      Lorretta Harp MD FACP,FACC,FAHA, [facility] 05/28/2017 9:44 AM

## 2017-05-28 NOTE — Assessment & Plan Note (Signed)
History of essential hypertension blood pressure measured 94/52. He is on lisinopril and metoprolol. Continue current meds are current dosing.

## 2017-05-28 NOTE — Assessment & Plan Note (Signed)
History of hyperlipidemia on statin therapy followed by PCP 

## 2017-05-28 NOTE — Assessment & Plan Note (Signed)
History of PAD status post remote right SFA stenting by myself as well as left iliac stenting. The last angiogram performed on him was 04/20/16 revealed a patent left external iliac artery stent with occluded SFAs bilaterally and three-vessel runoff on the right and some on the left. He possibly underwent right femoropopliteal bypass grafting by [name] March of this year and has been re-angiogram twice demonstrating a patent graft despite the fact that his symptoms have been worse. The possibility of a spinal issue was mentioned and he has undergone neurosurgical evaluation as well.

## 2017-05-31 ENCOUNTER — Encounter: Payer: Self-pay | Admitting: *Deleted

## 2017-05-31 ENCOUNTER — Encounter: Payer: Self-pay | Admitting: Surgery

## 2017-05-31 ENCOUNTER — Ambulatory Visit (INDEPENDENT_AMBULATORY_CARE_PROVIDER_SITE_OTHER): Payer: Medicare Other | Admitting: Surgery

## 2017-05-31 ENCOUNTER — Ambulatory Visit (INDEPENDENT_AMBULATORY_CARE_PROVIDER_SITE_OTHER)
Admission: RE | Admit: 2017-05-31 | Discharge: 2017-05-31 | Disposition: A | Discharge status: Home / Self Care | Payer: Medicare Other | Source: Ambulatory Visit | Attending: Surgery | Admitting: Surgery

## 2017-05-31 ENCOUNTER — Ambulatory Visit (HOSPITAL_COMMUNITY)
Admission: RE | Admit: 2017-05-31 | Discharge: 2017-05-31 | Disposition: A | Discharge status: Home / Self Care | Payer: Medicare Other | Source: Ambulatory Visit | Attending: Surgery | Admitting: Surgery

## 2017-05-31 VITALS — BP 110/62 | HR 76 | Resp 20 | Ht 64.0 in | Wt 144.0 lb

## 2017-05-31 DIAGNOSIS — N184 Chronic kidney disease, stage 4 (severe): Secondary | ICD-10-CM

## 2017-05-31 DIAGNOSIS — T82858D Stenosis of vascular prosthetic devices, implants and grafts, subsequent encounter: Secondary | ICD-10-CM | POA: Insufficient documentation

## 2017-05-31 DIAGNOSIS — I70211 Atherosclerosis of native arteries of extremities with intermittent claudication, right leg: Secondary | ICD-10-CM

## 2017-05-31 DIAGNOSIS — I6521 Occlusion and stenosis of right carotid artery: Secondary | ICD-10-CM

## 2017-05-31 DIAGNOSIS — I10 Essential (primary) hypertension: Secondary | ICD-10-CM | POA: Insufficient documentation

## 2017-05-31 DIAGNOSIS — Z87891 Personal history of nicotine dependence: Secondary | ICD-10-CM | POA: Insufficient documentation

## 2017-05-31 DIAGNOSIS — E785 Hyperlipidemia, unspecified: Secondary | ICD-10-CM | POA: Insufficient documentation

## 2017-05-31 DIAGNOSIS — Y832 Surgical operation with anastomosis, bypass or graft as the cause of abnormal reaction of the patient, or of later complication, without mention of misadventure at the time of the procedure: Secondary | ICD-10-CM | POA: Diagnosis not present

## 2017-05-31 DIAGNOSIS — E119 Type 2 diabetes mellitus without complications: Secondary | ICD-10-CM | POA: Diagnosis not present

## 2017-05-31 DIAGNOSIS — Z9862 Peripheral vascular angioplasty status: Secondary | ICD-10-CM | POA: Diagnosis not present

## 2017-05-31 NOTE — H&P (View-Only) (Signed)
[facility] [place]  Patient name: Christian Bailey MRN: [id number] DOB: [birth year] Sex: male   REASON FOR VISIT:    Follow up  HISOTRY OF PRESENT ILLNESS:    Christian Bailey a 76 y.o.malewho is status post right femoral-popliteal bypass graft with vein on 09/18/2016. This was done for claudication. Postoperatively, the patient's ABIs were below what I thought they should be and therefore he underwent angiography which revealed a widely patent bypass graft and no evidence for the reason why the ABIs were low. He returned home and his walking improve  He recently felt that his right leg was feeling the way did before surgery.  Therefore an 01/27/2017 he was taken for angiography.  Approximately 60% stenosis of the common femoral and proximal right femoral-popliteal bypass graft was identified.  These were successfully treated using Orbital atherectomy and a 5 mm drug coated balloon.   He states that for approximately 1 month he has been having worsening symptoms.   He did end up having an MRI because of his symptoms.  This did show some disease at L4.  He had a injection which did not change his symptoms.  He continues to take a statin for hypercholesterolemia and an ACE inhibitor for his hypertension.  PAST MEDICAL HISTORY:   Past Medical History:  Diagnosis Date  . Anemia   . Complication of anesthesia   . Diabetes mellitus ([place])    TYPE 2  . GERD (gastroesophageal reflux disease)   . GI bleed   . History of blood transfusion    GI bleed  . History of colon polyps   . History of hiatal hernia   . Hyperlipemia   . Hypertension   . Osteoarthritis   . PAD (peripheral artery disease) ([facility])    a. stenting of his left common iliac artery >20 years ago. b. h/o LEIA stent and 2 stents to R SFA in 2011. c. 04/2014:  s/p PTA of right SFA for in-stent restenosis, occluded left SFA  . PONV (postoperative nausea and vomiting)     no porblem with the last 3 surgeries  . RBBB (right bundle branch block with left anterior fascicular block)    NUCLEAR STRESS TEST, 08/18/2010 - no significant wall motion abnoramlities noted, post-stress EF 69%, normal myocardial perfusion study  . Sinus tachycardia    a. Noted during admission 04/2014 but upon review seems to be frequent finding for patient.  . Stenosis of carotid artery    a. 50% right carotid stenosis by angiogram 04/2014.     FAMILY HISTORY:   Family History  Problem Relation Age of Onset  . Heart disease Mother   . Leukemia Mother   . Stomach cancer Father   . Esophageal cancer Brother   . Liver disease Brother   . Alcoholism Brother   . Leukemia Brother   . Leukemia Brother   . Diabetes type II Brother   . Lung disease Sister     SOCIAL HISTORY:   Social History   Tobacco Use  . Smoking status: Former Smoker    Years: 25.00    Last attempt to quit: 06/1988    Years since quitting: 28.9  . Smokeless tobacco: Never Used  Substance Use Topics  . Alcohol use: Yes    Comment: rarely     ALLERGIES:   Allergies  Allergen Reactions  . Asa [Aspirin] Other (See Comments)    GI bleeding  . Gadolinium Derivatives Other (See Comments)  Pt complained of face flushing/hottness and throat tightness/scratchiness immediately after the injections.  Within 4 minutes, all symptoms were gone and the study was completed.  No further complications or signs of allergy were exhibited after completion of study.   . Oxycodone-Acetaminophen Other (See Comments)    Dizziness, uncomfortable      CURRENT MEDICATIONS:   Current Outpatient Medications  Medication Sig Dispense Refill  . clopidogrel (PLAVIX) 75 MG tablet TAKE 1 TABLET DAILY 90 tablet 0  . Coenzyme Q10 (COQ10 PO) Take 1 tablet by mouth daily.    [name] glucosamine-chondroitin 500-400 MG tablet Take 1 tablet by mouth daily.     [name] lisinopril (PRINIVIL,ZESTRIL) 5 MG tablet Take 5 mg by mouth 2 (two)  times daily.     . metFORMIN (GLUCOPHAGE) 1000 MG tablet Take 1 tablet (1,000 mg total) by mouth 2 (two) times daily with a meal.    . metoprolol succinate (TOPROL-XL) 25 MG 24 hr tablet Take 25 mg by mouth 2 (two) times daily.     . Omega-3 Fatty Acids (FISH OIL) 1200 MG CAPS Take 1,200 mg by mouth every evening.     . pantoprazole (PROTONIX) 40 MG tablet Take 2 tablets (80 mg total) by mouth daily. (Patient taking differently: Take 40 mg by mouth 2 (two) times daily. ) 180 tablet 2  . rosuvastatin (CRESTOR) 20 MG tablet Take 20 mg by mouth every evening.     [name] SUPREP BOWEL PREP KIT 17.5-3.13-1.6 GM/177ML SOLN Take 1 kit as directed by mouth. 354 mL 0   No current facility-administered medications for this visit.     REVIEW OF SYSTEMS:   '[X]'$  denotes positive finding, '[ ]'$  denotes negative finding Cardiac  Comments:  Chest pain or chest pressure:    Shortness of breath upon exertion:    Short of breath when lying flat:    Irregular heart rhythm:        Vascular    Pain in calf, thigh, or hip brought on by ambulation: x   Pain in feet at night that wakes you up from your sleep:     Blood clot in your veins:    Leg swelling:         Pulmonary    Oxygen at home:    Productive cough:     Wheezing:         Neurologic    Sudden weakness in arms or legs:     Sudden numbness in arms or legs:     Sudden onset of difficulty speaking or slurred speech:    Temporary loss of vision in one eye:     Problems with dizziness:         Gastrointestinal    Blood in stool:     Vomited blood:         Genitourinary    Burning when urinating:     Blood in urine:        Psychiatric    Major depression:         Hematologic    Bleeding problems:    Problems with blood clotting too easily:        Skin    Rashes or ulcers:        Constitutional    Fever or chills:      PHYSICAL EXAM:   Vitals:   05/31/17 1418  BP: 110/62  Pulse: 76  Resp: 20  SpO2: 99%  Weight: 144 lb (65.3 kg)    Height: 5'  4" (1.626 m)    GENERAL: The patient is a well-nourished male, in no acute distress. The vital signs are documented above. CARDIAC: There is a regular rate and rhythm.  VASCULAR: Pedal pulses not palpable right PULMONARY: Non-labored respirations ABDOMEN: Soft and non-tender with normal pitched bowel sounds.  NEUROLOGIC: No focal weakness or paresthesias are detected. SKIN: There are no ulcers or rashes noted. PSYCHIATRIC: The patient has a normal affect.  STUDIES:   I have reviewed his vascular studies which showed dramatic decrease in his ABI, now measuring 0.19.  There is a high-grade stenosis in the proximal bypass graft which does remain patent  MEDICAL ISSUES:   I discussed the ultrasound findings with the patient.  He has developed a recurrent stenosis within his bypass graft.  This needs to be evaluated with angiography in order to prevent bypass graft failure.  I am going to do this as soon as possible which has been scheduled for December 11.  This will be a left femoral access and intervention as indicated.    [name], MD [facility] of [facility] [phone] Pager [phone]

## 2017-05-31 NOTE — Progress Notes (Signed)
[facility]ist of [place]  Patient name: Christian Bailey MRN: [id number] DOB: 05-10-1941 Sex: male   REASON FOR VISIT:    Follow up  HISOTRY OF PRESENT ILLNESS:    Christian Bailey a 76 y.o.malewho is status post right femoral-popliteal bypass graft with vein on 09/18/2016. This was done for claudication. Postoperatively, the patient's ABIs were below what I thought they should be and therefore he underwent angiography which revealed a widely patent bypass graft and no evidence for the reason why the ABIs were low. He returned home and his walking improve  He recently felt that his right leg was feeling the way did before surgery.  Therefore an 01/27/2017 he was taken for angiography.  Approximately 60% stenosis of the common femoral and proximal right femoral-popliteal bypass graft was identified.  These were successfully treated using Orbital atherectomy and a 5 mm drug coated balloon.   He states that for approximately 1 month he has been having worsening symptoms.   He did end up having an MRI because of his symptoms.  This did show some disease at L4.  He had a injection which did not change his symptoms.  He continues to take a statin for hypercholesterolemia and an ACE inhibitor for his hypertension.  PAST MEDICAL HISTORY:   Past Medical History:  Diagnosis Date  . Anemia   . Complication of anesthesia   . Diabetes mellitus ([place])    TYPE 2  . GERD (gastroesophageal reflux disease)   . GI bleed   . History of blood transfusion    GI bleed  . History of colon polyps   . History of hiatal hernia   . Hyperlipemia   . Hypertension   . Osteoarthritis   . PAD (peripheral artery disease) ([facility])    a. stenting of his left common iliac artery >20 years ago. b. h/o LEIA stent and 2 stents to R SFA in 2011. c. 04/2014:  s/p PTA of right SFA for in-stent restenosis, occluded left SFA  . PONV (postoperative nausea and vomiting)     no porblem with the last 3 surgeries  . RBBB (right bundle branch block with left anterior fascicular block)    NUCLEAR STRESS TEST, 08/18/2010 - no significant wall motion abnoramlities noted, post-stress EF 69%, normal myocardial perfusion study  . Sinus tachycardia    a. Noted during admission 04/2014 but upon review seems to be frequent finding for patient.  . Stenosis of carotid artery    a. 50% right carotid stenosis by angiogram 04/2014.     FAMILY HISTORY:   Family History  Problem Relation Age of Onset  . Heart disease Mother   . Leukemia Mother   . Stomach cancer Father   . Esophageal cancer Brother   . Liver disease Brother   . Alcoholism Brother   . Leukemia Brother   . Leukemia Brother   . Diabetes type II Brother   . Lung disease Sister     SOCIAL HISTORY:   Social History   Tobacco Use  . Smoking status: Former Smoker    Years: 25.00    Last attempt to quit: 06/1988    Years since quitting: 28.9  . Smokeless tobacco: Never Used  Substance Use Topics  . Alcohol use: Yes    Comment: rarely     ALLERGIES:   Allergies  Allergen Reactions  . Asa [Aspirin] Other (See Comments)    GI bleeding  . Gadolinium Derivatives Other (See Comments)  Pt complained of face flushing/hottness and throat tightness/scratchiness immediately after the injections.  Within 4 minutes, all symptoms were gone and the study was completed.  No further complications or signs of allergy were exhibited after completion of study.   . Oxycodone-Acetaminophen Other (See Comments)    Dizziness, uncomfortable      CURRENT MEDICATIONS:   Current Outpatient Medications  Medication Sig Dispense Refill  . clopidogrel (PLAVIX) 75 MG tablet TAKE 1 TABLET DAILY 90 tablet 0  . Coenzyme Q10 (COQ10 PO) Take 1 tablet by mouth daily.    [name] glucosamine-chondroitin 500-400 MG tablet Take 1 tablet by mouth daily.     [name] lisinopril (PRINIVIL,ZESTRIL) 5 MG tablet Take 5 mg by mouth 2 (two)  times daily.     . metFORMIN (GLUCOPHAGE) 1000 MG tablet Take 1 tablet (1,000 mg total) by mouth 2 (two) times daily with a meal.    . metoprolol succinate (TOPROL-XL) 25 MG 24 hr tablet Take 25 mg by mouth 2 (two) times daily.     . Omega-3 Fatty Acids (FISH OIL) 1200 MG CAPS Take 1,200 mg by mouth every evening.     . pantoprazole (PROTONIX) 40 MG tablet Take 2 tablets (80 mg total) by mouth daily. (Patient taking differently: Take 40 mg by mouth 2 (two) times daily. ) 180 tablet 2  . rosuvastatin (CRESTOR) 20 MG tablet Take 20 mg by mouth every evening.     [name] SUPREP BOWEL PREP KIT 17.5-3.13-1.6 GM/177ML SOLN Take 1 kit as directed by mouth. 354 mL 0   No current facility-administered medications for this visit.     REVIEW OF SYSTEMS:   '[X]'$  denotes positive finding, '[ ]'$  denotes negative finding Cardiac  Comments:  Chest pain or chest pressure:    Shortness of breath upon exertion:    Short of breath when lying flat:    Irregular heart rhythm:        Vascular    Pain in calf, thigh, or hip brought on by ambulation: x   Pain in feet at night that wakes you up from your sleep:     Blood clot in your veins:    Leg swelling:         Pulmonary    Oxygen at home:    Productive cough:     Wheezing:         Neurologic    Sudden weakness in arms or legs:     Sudden numbness in arms or legs:     Sudden onset of difficulty speaking or slurred speech:    Temporary loss of vision in one eye:     Problems with dizziness:         Gastrointestinal    Blood in stool:     Vomited blood:         Genitourinary    Burning when urinating:     Blood in urine:        Psychiatric    Major depression:         Hematologic    Bleeding problems:    Problems with blood clotting too easily:        Skin    Rashes or ulcers:        Constitutional    Fever or chills:      PHYSICAL EXAM:   Vitals:   05/31/17 1418  BP: 110/62  Pulse: 76  Resp: 20  SpO2: 99%  Weight: 144 lb (65.3 kg)    Height: 5'  4" (1.626 m)    GENERAL: The patient is a well-nourished male, in no acute distress. The vital signs are documented above. CARDIAC: There is a regular rate and rhythm.  VASCULAR: Pedal pulses not palpable right PULMONARY: Non-labored respirations ABDOMEN: Soft and non-tender with normal pitched bowel sounds.  NEUROLOGIC: No focal weakness or paresthesias are detected. SKIN: There are no ulcers or rashes noted. PSYCHIATRIC: The patient has a normal affect.  STUDIES:   I have reviewed his vascular studies which showed dramatic decrease in his ABI, now measuring 0.19.  There is a high-grade stenosis in the proximal bypass graft which does remain patent  MEDICAL ISSUES:   I discussed the ultrasound findings with the patient.  He has developed a recurrent stenosis within his bypass graft.  This needs to be evaluated with angiography in order to prevent bypass graft failure.  I am going to do this as soon as possible which has been scheduled for December 11.  This will be a left femoral access and intervention as indicated.    [name], MD [facility] of [facility] [phone] Pager [phone]

## 2017-06-01 ENCOUNTER — Other Ambulatory Visit: Payer: Self-pay | Admitting: *Deleted

## 2017-06-02 ENCOUNTER — Encounter: Payer: Self-pay | Admitting: Gastroenterology

## 2017-06-02 ENCOUNTER — Ambulatory Visit (AMBULATORY_SURGERY_CENTER): Payer: Medicare Other | Admitting: Gastroenterology

## 2017-06-02 ENCOUNTER — Other Ambulatory Visit: Payer: Self-pay

## 2017-06-02 VITALS — BP 123/59 | HR 96 | Temp 96.8°F | Resp 13 | Ht 65.0 in | Wt 149.0 lb

## 2017-06-02 DIAGNOSIS — K317 Polyp of stomach and duodenum: Secondary | ICD-10-CM | POA: Diagnosis not present

## 2017-06-02 DIAGNOSIS — D649 Anemia, unspecified: Secondary | ICD-10-CM | POA: Diagnosis present

## 2017-06-02 DIAGNOSIS — D123 Benign neoplasm of transverse colon: Secondary | ICD-10-CM

## 2017-06-02 DIAGNOSIS — D120 Benign neoplasm of cecum: Secondary | ICD-10-CM

## 2017-06-02 DIAGNOSIS — D122 Benign neoplasm of ascending colon: Secondary | ICD-10-CM | POA: Diagnosis not present

## 2017-06-02 DIAGNOSIS — Z86010 Personal history of colonic polyps: Secondary | ICD-10-CM | POA: Diagnosis not present

## 2017-06-02 DIAGNOSIS — K5730 Diverticulosis of large intestine without perforation or abscess without bleeding: Secondary | ICD-10-CM

## 2017-06-02 DIAGNOSIS — D509 Iron deficiency anemia, unspecified: Secondary | ICD-10-CM | POA: Diagnosis not present

## 2017-06-02 DIAGNOSIS — I2510 Atherosclerotic heart disease of native coronary artery without angina pectoris: Secondary | ICD-10-CM | POA: Diagnosis not present

## 2017-06-02 MED ORDER — SODIUM CHLORIDE 0.9 % IV SOLN: 500.0000 mL | Freq: Once | INTRAVENOUS | Status: DC

## 2017-06-02 NOTE — Patient Instructions (Signed)
YOU HAD AN ENDOSCOPIC PROCEDURE TODAY AT [facility]:   Refer to the procedure report that was given to you for any specific questions about what was found during the examination.  If the procedure report does not answer your questions, please call your gastroenterologist to clarify.  If you requested that your care partner not be given the details of your procedure findings, then the procedure report has been included in a sealed envelope for you to review at your convenience later.  YOU SHOULD EXPECT: Some feelings of bloating in the abdomen. Passage of more gas than usual.  Walking can help get rid of the air that was put into your GI tract during the procedure and reduce the bloating. If you had a lower endoscopy (such as a colonoscopy or flexible sigmoidoscopy) you may notice spotting of blood in your stool or on the toilet paper. If you underwent a bowel prep for your procedure, you may not have a normal bowel movement for a few days.  Please Note:  You might notice some irritation and congestion in your nose or some drainage.  This is from the oxygen used during your procedure.  There is no need for concern and it should clear up in a day or so.  SYMPTOMS TO REPORT IMMEDIATELY:   Following lower endoscopy (colonoscopy or flexible sigmoidoscopy):  Excessive amounts of blood in the stool  Significant tenderness or worsening of abdominal pains  Swelling of the abdomen that is new, acute  Fever of 100F or higher   Following upper endoscopy (EGD)  Vomiting of blood or coffee ground material  New chest pain or pain under the shoulder blades  Painful or persistently difficult swallowing  New shortness of breath  Fever of 100F or higher  Black, tarry-looking stools  For urgent or emergent issues, a gastroenterologist can be reached at any hour by calling [phone].   DIET:  We do recommend a small meal at first, but then you may proceed to your regular diet.  Drink  plenty of fluids but you should avoid alcoholic beverages for 24 hours.  ACTIVITY:  You should plan to take it easy for the rest of today and you should NOT DRIVE or use heavy machinery until tomorrow (because of the sedation medicines used during the test).    FOLLOW UP: Our staff will call the number listed on your records the next business day following your procedure to check on you and address any questions or concerns that you may have regarding the information given to you following your procedure. If we do not reach you, we will leave a message.  However, if you are feeling well and you are not experiencing any problems, there is no need to return our call.  We will assume that you have returned to your regular daily activities without incident.  If any biopsies were taken you will be contacted by phone or by letter within the next 1-3 weeks.  Please call us at [phone] if you have not heard about the biopsies in 3 weeks.   SIGNATURES/CONFIDENTIALITY: You and/or your care partner have signed paperwork which will be entered into your electronic medical record.  These signatures attest to the fact that that the information above on your After Visit Summary has been reviewed and is understood.  Full responsibility of the confidentiality of this discharge information lies with you and/or your care-partner.  Await pathology  RESUME PLAVIX AT PRIOR DOSE IN 3 DAYS, continue  your other medications   Please read over handouts about polyps, diverticulosis and hemorrhoids  Your blood sugar in the recovery room was 101

## 2017-06-02 NOTE — Op Note (Signed)
[facility] Patient Name: Christian Bailey Procedure Date: 06/02/2017 11:27 AM MRN: [id number] Endoscopist: Mallie Mussel L. Loletha Carrow , MD Age: 76 Referring MD:  Date of Birth: 06/13/1941 Gender: Male Account #: [id number] Procedure:                Upper GI endoscopy Indications:              Anemia Medicines:                Monitored Anesthesia Care Procedure:                Pre-Anesthesia Assessment:                           - Prior to the procedure, a History and Physical                            was performed, and patient medications and                            allergies were reviewed. The patient's tolerance of                            previous anesthesia was also reviewed. The risks                            and benefits of the procedure and the sedation                            options and risks were discussed with the patient.                            All questions were answered, and informed consent                            was obtained. Prior Anticoagulants: The patient has                            taken Plavix (clopidogrel), last dose was 5 days                            prior to procedure. ASA Grade Assessment: III - A                            patient with severe systemic disease. After                            reviewing the risks and benefits, the patient was                            deemed in satisfactory condition to undergo the                            procedure.  After obtaining informed consent, the endoscope was                            passed under direct vision. Throughout the                            procedure, the patient's blood pressure, pulse, and                            oxygen saturations were monitored continuously. The                            Endoscope was introduced through the mouth, and                            advanced to the second part of duodenum. The upper                            GI  endoscopy was accomplished without difficulty.                            The patient tolerated the procedure well. Scope In: Scope Out: Findings:                 The esophagus was normal.                           A single 10-12 mm pedunculated polyp with no                            stigmata of recent bleeding was found on the                            greater curvature of the gastric antrum. Biopsies                            were taken with a cold forceps for histology.                           The cardia and gastric fundus were normal on                            retroflexion.                           The examined duodenum was normal. Complications:            No immediate complications. Estimated Blood Loss:     Estimated blood loss was minimal. Impression:               - Normal esophagus.                           - A single gastric polyp. Biopsied.                           -  Normal examined duodenum. Recommendation:           - Patient has a contact number available for                            emergencies. The signs and symptoms of potential                            delayed complications were discussed with the                            patient. Return to normal activities tomorrow.                            Written discharge instructions were provided to the                            patient.                           - Resume previous diet.                           - Resume Plavix (clopidogrel) at prior dose in 3                            days.                           - Await pathology results. Then plan repeat upper                            endoscopy for polypectomy.                           - See the other procedure note for documentation of                            additional recommendations. Myrtis Maille L. Loletha Carrow, MD 06/02/2017 12:19:29 PM This report has been signed electronically.

## 2017-06-02 NOTE — Op Note (Signed)
[place] Patient Name: Christian Bailey Procedure Date: 06/02/2017 11:27 AM MRN: [id number] Endoscopist: Mallie Mussel L. Loletha Carrow , MD Age: 76 Referring MD:  Date of Birth: August 03, 1940 Gender: Male Account #: [id number] Procedure:                Colonoscopy Indications:              Anemia Medicines:                Monitored Anesthesia Care Procedure:                Pre-Anesthesia Assessment:                           - Prior to the procedure, a History and Physical                            was performed, and patient medications and                            allergies were reviewed. The patient's tolerance of                            previous anesthesia was also reviewed. The risks                            and benefits of the procedure and the sedation                            options and risks were discussed with the patient.                            All questions were answered, and informed consent                            was obtained. Prior Anticoagulants: The patient has                            taken Plavix (clopidogrel), last dose was 5 days                            prior to procedure. ASA Grade Assessment: III - A                            patient with severe systemic disease. After                            reviewing the risks and benefits, the patient was                            deemed in satisfactory condition to undergo the                            procedure.  After obtaining informed consent, the colonoscope                            was passed under direct vision. Throughout the                            procedure, the patient's blood pressure, pulse, and                            oxygen saturations were monitored continuously. The                            Colonoscope was introduced through the anus and                            advanced to the the cecum, identified by                            appendiceal orifice  and ileocecal valve. The                            colonoscopy was performed without difficulty. The                            patient tolerated the procedure well. The quality                            of the bowel preparation was excellent. The                            ileocecal valve, appendiceal orifice, and rectum                            were photographed. The quality of the bowel                            preparation was evaluated using the BBPS [facility]                            Bowel Preparation Scale) with scores of: Right                            Colon = 3, Transverse Colon = 3 and Left Colon = 3                            (entire mucosa seen well with no residual staining,                            small fragments of stool or opaque liquid). The                            total BBPS score equals 9. The bowel preparation  used was Plenvu. Scope In: 11:45:49 AM Scope Out: 12:07:52 PM Scope Withdrawal Time: 0 hours 20 minutes 7 seconds  Total Procedure Duration: 0 hours 22 minutes 3 seconds  Findings:                 The perianal and digital rectal examinations were                            normal.                           Five sessile polyps were found in the proximal                            ascending colon and cecum. The polyps were 2 to 4                            mm in size. These polyps were removed with a cold                            biopsy forceps. Resection and retrieval were                            complete.                           A 8 mm polyp was found in the proximal ascending                            colon. The polyp was sessile (probable SSP by [facility]                            and NBI). The polyp was removed with a hot snare.                            Resection and retrieval were complete.                           Two sessile polyps were found in the splenic                            flexure. The polyps were 2  to 4 mm in size. These                            polyps were removed with a cold biopsy forceps.                            Resection and retrieval were complete.                           Multiple diverticula were found in the left colon.                           Internal hemorrhoids were found. The hemorrhoids  were Grade I (internal hemorrhoids that do not                            prolapse).                           The exam was otherwise without abnormality on                            direct and retroflexion views. Complications:            No immediate complications. Estimated Blood Loss:     Estimated blood loss was minimal. Impression:               - Five 2 to 4 mm polyps in the proximal ascending                            colon and in the cecum, removed with a cold biopsy                            forceps. Resected and retrieved.                           - One 8 mm polyp in the proximal ascending colon,                            removed with a hot snare. Resected and retrieved.                           - Two 2 to 4 mm polyps at the splenic flexure,                            removed with a cold biopsy forceps. Resected and                            retrieved.                           - Diverticulosis in the left colon.                           - Internal hemorrhoids.                           - The examination was otherwise normal on direct                            and retroflexion views.                           Nothing seen on EGD or colonoscopy to explain this                            patient's normocytic anemia, which is not clearly  from GI blood loss. Recommendation:           - Patient has a contact number available for                            emergencies. The signs and symptoms of potential                            delayed complications were discussed with the                            patient.  Return to normal activities tomorrow.                            Written discharge instructions were provided to the                            patient.                           - Resume previous diet.                           - Resume Plavix (clopidogrel) at prior dose in 3                            days.                           - Await pathology results.                           - Repeat colonoscopy is recommended for                            surveillance. The colonoscopy date will be                            determined after pathology results from today's                            exam become available for review.                           - Return to primary care physician to consider                            hematology evaluation.[name] Mattheu Brodersen L. Loletha Carrow, MD 06/02/2017 12:24:58 PM This report has been signed electronically.

## 2017-06-02 NOTE — Progress Notes (Signed)
Called to room to assist during endoscopic procedure.  Patient ID and intended procedure confirmed with present staff. Received instructions for my participation in the procedure from the performing physician.  

## 2017-06-02 NOTE — Progress Notes (Signed)
Report given to PACU, vss 

## 2017-06-02 NOTE — Progress Notes (Signed)
Pt's states no medical or surgical changes since previsit or office visit. 

## 2017-06-02 NOTE — Progress Notes (Signed)
Esmolol 20mg  at 1148, at 1153, at 1203

## 2017-06-03 ENCOUNTER — Telehealth: Payer: Self-pay

## 2017-06-03 DIAGNOSIS — E1351 Other specified diabetes mellitus with diabetic peripheral angiopathy without gangrene: Secondary | ICD-10-CM | POA: Diagnosis not present

## 2017-06-03 DIAGNOSIS — M79671 Pain in right foot: Secondary | ICD-10-CM | POA: Diagnosis not present

## 2017-06-03 DIAGNOSIS — L602 Onychogryphosis: Secondary | ICD-10-CM | POA: Diagnosis not present

## 2017-06-03 DIAGNOSIS — I739 Peripheral vascular disease, unspecified: Secondary | ICD-10-CM | POA: Diagnosis not present

## 2017-06-03 DIAGNOSIS — M79672 Pain in left foot: Secondary | ICD-10-CM | POA: Diagnosis not present

## 2017-06-03 NOTE — Telephone Encounter (Signed)
  Follow up Call-  Call back number 06/02/2017  Post procedure Call Back phone  # [phone]  Permission to leave phone message Yes  Some recent data might be hidden     Patient questions:  Do you have a fever, pain , or abdominal swelling? No. Pain Score  0 *  Have you tolerated food without any problems? Yes.    Have you been able to return to your normal activities? Yes.    Do you have any questions about your discharge instructions: Diet   No. Medications  No. Follow up visit  No.  Do you have questions or concerns about your Care? No.  Actions: * If pain score is 4 or above: No action needed, pain <4.

## 2017-06-08 ENCOUNTER — Ambulatory Visit (HOSPITAL_COMMUNITY)
Admission: RE | Admit: 2017-06-08 | Discharge: 2017-06-08 | Disposition: A | Discharge status: Home / Self Care | Payer: Medicare Other | Source: Ambulatory Visit | Attending: Surgery | Admitting: Surgery

## 2017-06-08 ENCOUNTER — Encounter (HOSPITAL_COMMUNITY)
Admission: RE | Disposition: A | Discharge status: Home / Self Care | Payer: Self-pay | Source: Ambulatory Visit | Attending: Surgery

## 2017-06-08 ENCOUNTER — Encounter (HOSPITAL_COMMUNITY): Payer: Self-pay | Admitting: *Deleted

## 2017-06-08 DIAGNOSIS — M1990 Unspecified osteoarthritis, unspecified site: Secondary | ICD-10-CM | POA: Insufficient documentation

## 2017-06-08 DIAGNOSIS — Z7984 Long term (current) use of oral hypoglycemic drugs: Secondary | ICD-10-CM | POA: Diagnosis not present

## 2017-06-08 DIAGNOSIS — E785 Hyperlipidemia, unspecified: Secondary | ICD-10-CM | POA: Diagnosis not present

## 2017-06-08 DIAGNOSIS — Z7902 Long term (current) use of antithrombotics/antiplatelets: Secondary | ICD-10-CM | POA: Diagnosis not present

## 2017-06-08 DIAGNOSIS — I6521 Occlusion and stenosis of right carotid artery: Secondary | ICD-10-CM | POA: Diagnosis not present

## 2017-06-08 DIAGNOSIS — I70311 Atherosclerosis of unspecified type of bypass graft(s) of the extremities with intermittent claudication, right leg: Secondary | ICD-10-CM | POA: Diagnosis not present

## 2017-06-08 DIAGNOSIS — I452 Bifascicular block: Secondary | ICD-10-CM | POA: Insufficient documentation

## 2017-06-08 DIAGNOSIS — K219 Gastro-esophageal reflux disease without esophagitis: Secondary | ICD-10-CM | POA: Diagnosis not present

## 2017-06-08 DIAGNOSIS — E1151 Type 2 diabetes mellitus with diabetic peripheral angiopathy without gangrene: Secondary | ICD-10-CM | POA: Diagnosis not present

## 2017-06-08 DIAGNOSIS — Z87891 Personal history of nicotine dependence: Secondary | ICD-10-CM | POA: Insufficient documentation

## 2017-06-08 DIAGNOSIS — M79604 Pain in right leg: Secondary | ICD-10-CM | POA: Diagnosis not present

## 2017-06-08 HISTORY — PX: PERIPHERAL VASCULAR BALLOON ANGIOPLASTY: CATH118281

## 2017-06-08 HISTORY — PX: ABDOMINAL AORTOGRAM W/LOWER EXTREMITY: CATH118223

## 2017-06-08 LAB — POCT I-STAT, CHEM 8
BUN: 26 mg/dL — ABNORMAL HIGH (ref 6–20)
CREATININE: 1.2 mg/dL (ref 0.61–1.24)
Calcium, Ion: 1.26 mmol/L (ref 1.15–1.40)
Chloride: 100 mmol/L — ABNORMAL LOW (ref 101–111)
Glucose, Bld: 121 mg/dL — ABNORMAL HIGH (ref 65–99)
HEMATOCRIT: 31 % — AB (ref 39.0–52.0)
HEMOGLOBIN: 10.5 g/dL — AB (ref 13.0–17.0)
POTASSIUM: 4.5 mmol/L (ref 3.5–5.1)
SODIUM: 141 mmol/L (ref 135–145)
TCO2: 29 mmol/L (ref 22–32)

## 2017-06-08 LAB — GLUCOSE, CAPILLARY: GLUCOSE-CAPILLARY: 104 mg/dL — AB (ref 65–99)

## 2017-06-08 LAB — POCT ACTIVATED CLOTTING TIME
ACTIVATED CLOTTING TIME: 219 s
Activated Clotting Time: 175 seconds
Activated Clotting Time: 197 seconds

## 2017-06-08 SURGERY — ABDOMINAL AORTOGRAM W/LOWER EXTREMITY
Anesthesia: LOCAL | Laterality: Right

## 2017-06-08 MED ORDER — FENTANYL CITRATE (PF) 100 MCG/2ML IJ SOLN
INTRAMUSCULAR | Status: DC | PRN
  Administered 2017-06-08 (×3): 25 ug via INTRAVENOUS

## 2017-06-08 MED ORDER — SODIUM CHLORIDE 0.9 % IV SOLN: 250.0000 mL | INTRAVENOUS | Status: DC | PRN

## 2017-06-08 MED ORDER — SODIUM CHLORIDE 0.9 % IV SOLN
INTRAVENOUS | Status: DC
  Administered 2017-06-08: 12:00:00 via INTRAVENOUS

## 2017-06-08 MED ORDER — SODIUM CHLORIDE 0.9% FLUSH: 3.0000 mL | Freq: Two times a day (BID) | INTRAVENOUS | Status: DC

## 2017-06-08 MED ORDER — HYDROMORPHONE HCL 1 MG/ML IJ SOLN: 0.5000 mg | INTRAMUSCULAR | Status: DC | PRN

## 2017-06-08 MED ORDER — HEPARIN (PORCINE) IN NACL 2-0.9 UNIT/ML-% IJ SOLN
INTRAMUSCULAR | Status: AC | PRN
  Administered 2017-06-08: 1000 mL via INTRA_ARTERIAL

## 2017-06-08 MED ORDER — SODIUM CHLORIDE 0.9 % WEIGHT BASED INFUSION: 1.0000 mL/kg/h | INTRAVENOUS | Status: DC

## 2017-06-08 MED ORDER — LIDOCAINE HCL (PF) 1 % IJ SOLN
INTRAMUSCULAR | Status: DC | PRN
  Administered 2017-06-08: 15 mL

## 2017-06-08 MED ORDER — SODIUM CHLORIDE 0.9% FLUSH: 3.0000 mL | INTRAVENOUS | Status: DC | PRN

## 2017-06-08 MED ORDER — MIDAZOLAM HCL 2 MG/2ML IJ SOLN
INTRAMUSCULAR | Status: AC
  Filled 2017-06-08: qty 2

## 2017-06-08 MED ORDER — IODIXANOL 320 MG/ML IV SOLN
INTRAVENOUS | Status: DC | PRN
  Administered 2017-06-08: 120 mL via INTRA_ARTERIAL

## 2017-06-08 MED ORDER — FENTANYL CITRATE (PF) 100 MCG/2ML IJ SOLN
INTRAMUSCULAR | Status: AC
  Filled 2017-06-08: qty 2

## 2017-06-08 MED ORDER — MIDAZOLAM HCL 2 MG/2ML IJ SOLN
INTRAMUSCULAR | Status: DC | PRN
  Administered 2017-06-08 (×3): 1 mg via INTRAVENOUS

## 2017-06-08 MED ORDER — HYDRALAZINE HCL 20 MG/ML IJ SOLN: 5.0000 mg | INTRAMUSCULAR | Status: DC | PRN

## 2017-06-08 MED ORDER — LABETALOL HCL 5 MG/ML IV SOLN: 10.0000 mg | INTRAVENOUS | Status: DC | PRN

## 2017-06-08 MED ORDER — LIDOCAINE HCL (PF) 1 % IJ SOLN
INTRAMUSCULAR | Status: AC
  Filled 2017-06-08: qty 30

## 2017-06-08 MED ORDER — HEPARIN SODIUM (PORCINE) 1000 UNIT/ML IJ SOLN
INTRAMUSCULAR | Status: DC | PRN
  Administered 2017-06-08: 7000 [IU] via INTRAVENOUS

## 2017-06-08 MED ORDER — HEPARIN (PORCINE) IN NACL 2-0.9 UNIT/ML-% IJ SOLN
INTRAMUSCULAR | Status: AC
  Filled 2017-06-08: qty 1000

## 2017-06-08 SURGICAL SUPPLY — 22 items
BALLN LUTONIX DCB 4X40X130 (BALLOONS) ×3
BALLN MUSTANG 4.0X40 75 (BALLOONS) ×3
BALLOON LUTONIX DCB 4X40X130 (BALLOONS) ×2 IMPLANT
BALLOON MUSTANG 4.0X40 75 (BALLOONS) ×2 IMPLANT
CATH ANGIO 5F BER2 65CM (CATHETERS) ×3 IMPLANT
CATH OMNI FLUSH 5F 65CM (CATHETERS) ×3 IMPLANT
COVER PRB 48X5XTLSCP FOLD TPE (BAG) ×2 IMPLANT
COVER PROBE 5X48 (BAG) ×1
KIT ENCORE 26 ADVANTAGE (KITS) ×3 IMPLANT
KIT MICROINTRODUCER STIFF 5F (SHEATH) ×6 IMPLANT
KIT PV (KITS) ×3 IMPLANT
SHEATH PINNACLE 5F 10CM (SHEATH) ×3 IMPLANT
SHEATH PINNACLE 7F 10CM (SHEATH) ×3 IMPLANT
SHEATH PINNACLE MP 7F 45CM (SHEATH) ×3 IMPLANT
SHEATH PINNACLE ST 7F 45CM (SHEATH) ×3 IMPLANT
SYR MEDRAD MARK V 150ML (SYRINGE) ×3 IMPLANT
TRANSDUCER W/STOPCOCK (MISCELLANEOUS) ×3 IMPLANT
TRAY PV CATH (CUSTOM PROCEDURE TRAY) ×3 IMPLANT
WIRE BENTSON .035X145CM (WIRE) ×3 IMPLANT
WIRE HI TORQ VERSACORE J 260CM (WIRE) ×3 IMPLANT
WIRE ROSEN-J .035X260CM (WIRE) ×3 IMPLANT
WIRE SPARTACORE .014X190CM (WIRE) ×6 IMPLANT

## 2017-06-08 NOTE — Discharge Instructions (Signed)
NO METFORMIN/GLUCOPHAGE FOR 2 DAYS ° ° ° °Femoral Site Care °Refer to this sheet in the next few weeks. These instructions provide you with information about caring for yourself after your procedure. Your health care provider may also give you more specific instructions. Your treatment has been planned according to current medical practices, but problems sometimes occur. Call your health care provider if you have any problems or questions after your procedure. °What can I expect after the procedure? °After your procedure, it is typical to have the following: °· Bruising at the site that usually fades within 1-2 weeks. °· Blood collecting in the tissue (hematoma) that may be painful to the touch. It should usually decrease in size and tenderness within 1-2 weeks. ° °Follow these instructions at home: °· Take medicines only as directed by your health care provider. °· You may shower 24-48 hours after the procedure or as directed by your health care provider. Remove the bandage (dressing) and gently wash the site with plain soap and water. Pat the area dry with a clean towel. Do not rub the site, because this may cause bleeding. °· Do not take baths, swim, or use a hot tub until your health care provider approves. °· Check your insertion site every day for redness, swelling, or drainage. °· Do not apply powder or lotion to the site. °· Limit use of stairs to twice a day for the first 2-3 days or as directed by your health care provider. °· Do not squat for the first 2-3 days or as directed by your health care provider. °· Do not lift over 10 lb (4.5 kg) for 5 days after your procedure or as directed by your health care provider. °· Ask your health care provider when it is okay to: °? Return to work or school. °? Resume usual physical activities or sports. °? Resume sexual activity. °· Do not drive home if you are discharged the same day as the procedure. Have someone else drive you. °· You may drive 24 hours after the  procedure unless otherwise instructed by your health care provider. °· Do not operate machinery or power tools for 24 hours after the procedure or as directed by your health care provider. °· If your procedure was done as an outpatient procedure, which means that you went home the same day as your procedure, a responsible adult should be with you for the first 24 hours after you arrive home. °· Keep all follow-up visits as directed by your health care provider. This is important. °Contact a health care provider if: °· You have a fever. °· You have chills. °· You have increased bleeding from the site. Hold pressure on the site. °Get help right away if: °· You have unusual pain at the site. °· You have redness, warmth, or swelling at the site. °· You have drainage (other than a small amount of blood on the dressing) from the site. °· The site is bleeding, and the bleeding does not stop after 30 minutes of holding steady pressure on the site. °· Your leg or foot becomes pale, cool, tingly, or numb. °This information is not intended to replace advice given to you by your health care provider. Make sure you discuss any questions you have with your health care provider. °Document Released: 02/16/2014 Document Revised: 11/21/2015 Document Reviewed: 01/02/2014 °Elsevier Interactive Patient Education © 2018 [employer] °Moderate Conscious Sedation, Adult, Care After °These instructions provide you with information about caring for yourself after your procedure. Your   health care provider may also give you more specific instructions. Your treatment has been planned according to current medical practices, but problems sometimes occur. Call your health care provider if you have any problems or questions after your procedure. °What can I expect after the procedure? °After your procedure, it is common: °· To feel sleepy for several hours. °· To feel clumsy and have poor balance for several hours. °· To have poor judgment for  several hours. °· To vomit if you eat too soon. ° °Follow these instructions at home: °For at least 24 hours after the procedure: ° °· Do not: °? Participate in activities where you could fall or become injured. °? Drive. °? Use heavy machinery. °? Drink alcohol. °? Take sleeping pills or medicines that cause drowsiness. °? Make important decisions or sign legal documents. °? Take care of children on your own. °· Rest. °Eating and drinking °· Follow the diet recommended by your health care provider. °· If you vomit: °? Drink water, juice, or soup when you can drink without vomiting. °? Make sure you have little or no nausea before eating solid foods. °General instructions °· Have a responsible adult stay with you until you are awake and alert. °· Take over-the-counter and prescription medicines only as told by your health care provider. °· If you smoke, do not smoke without supervision. °· Keep all follow-up visits as told by your health care provider. This is important. °Contact a health care provider if: °· You keep feeling nauseous or you keep vomiting. °· You feel light-headed. °· You develop a rash. °· You have a fever. °Get help right away if: °· You have trouble breathing. °This information is not intended to replace advice given to you by your health care provider. Make sure you discuss any questions you have with your health care provider. °Document Released: 04/05/2013 Document Revised: 11/18/2015 Document Reviewed: 10/05/2015 °Elsevier Interactive Patient Education © 2018 [employer] ° °

## 2017-06-08 NOTE — Progress Notes (Signed)
Site area: Left groin a 7 french arterial sheath was removed  Site Prior to Removal:  Level 0  Pressure Applied For 30 MINUTES    BedrestBeginning at  1700p  Manual:   Yes.    Patient Status During Pull:  stable  Post Pull Groin Site:  Level 0  Post Pull Instructions Given:  Yes.    Post Pull Pulses Present:  Yes.    Dressing Applied:  Yes.    Comments:  VS remain stable during sheath pull

## 2017-06-08 NOTE — Op Note (Signed)
Patient name: Christian Bailey MRN: [id number] DOB: [birth year] Sex: male  06/08/2017 Pre-operative Diagnosis: worsening right leg pain Post-operative diagnosis:  Same Surgeon:  [name] Procedure Performed:  1.  Ultrasound-guided access, left femoral artery  2.  Abdominal aortogram  3.  Right lower extremity runoff  4.  Drug-coated balloon angioplasty, right common femoral artery  5.  Drug-coated balloon angioplasty, right superficial femoral artery   6.  conscious sedation (77 minutes)     Indications: The patient has previously undergone right femoral-popliteal bypass graft.  He has also had atherectomy and drug-coated balloon angioplasty of the proximal anastomosis.  He is experiencing worsening pain.  Duplex showed significant decrease in his ABIs with a proximal stenosis.  He is here today for further evaluation  Procedure:  The patient was identified in the holding area and taken to room 8.  The patient was then placed supine on the table and prepped and draped in the usual sterile fashion.  A time out was called.  Conscious sedation was administered with the use of IV fentanyl and Versed under continuous physician and nurse monitoring.  Heart rate, blood pressure, and oxygen saturations were continuously monitored.  Ultrasound was used to evaluate the left common femoral artery.  It was patent .  A digital ultrasound image was acquired.  A micropuncture needle was used to access the left common femoral artery under ultrasound guidance.  An 018 wire was advanced without resistance and a micropuncture sheath was placed.  The 018 wire was removed and a benson wire was placed.  The micropuncture sheath was exchanged for a 5 french sheath.  An omniflush catheter was advanced over the wire to the level of L-1.  An abdominal angiogram was obtained.  Next, using the omniflush catheter and a benson wire, the aortic bifurcation was crossed and the catheter was placed into theright external iliac  artery and right runoff was obtained.   Findings:   Aortogram: No significant renal artery stenosis was identified.  The infrarenal abdominal aorta is widely patent.  The left iliac system with its associated stents are widely patent.  The right common and external iliac artery are small in caliber but patent without significant stenosis.  Right Lower Extremity: Right femoral artery is patent however there is a significant, 70% stenosis which extends into the proximal portion of the bypass graft.  There is luminal narrowing at the origin of the profundofemoral artery, less than 50%.  The bypass graft after the initial stenosis is widely patent.  The distal anastomosis is widely patent.  Left Lower Extremity:  Not evaluated  Intervention: After the above images were acquired the decision was made to proceed with intervention.  Over a Rosen wire, a 7 French 45 cm sheath was advanced into the right external iliac artery.  The patient was fully heparinized I then advanced a 035 Woolley wire into the femoral-popliteal bypass graft.  I then used a Berenstein 2 catheter and a 014 [place] core wire to get wire access into the profundofemoral artery for protection.  I then inserted a 4 x 40 Mustang balloon and performed balloon angioplasty of the proximal bypass graft as well as the common femoral artery.  I then selected a 4 x 40 drug-coated balloon and performed drug-coated balloon angioplasty of the common femoral artery and theLutonix proximal femoral-popliteal bypass graft.  Completion imaging revealed resolution of the stenosis and no change in the opacification of the profundofemoral artery.  I then got additional images of  the external iliac artery and no identifiable stenosis was obtained.  Catheters and wires were then removed.  The sheath was withdrawn to the left external iliac artery.  The patient was taken to the holding area for sheath pull once his coagulation profile corrects.  Impression:  #1   Greater than 70% stenosis within the right common femoral and proximal portion of the femoral-popliteal bypass graft.  This was successfully treated with drug-coated balloon angioplasty using a 4 x 40 balloon.Lutonix no residual stenosis.    Theotis Burrow, M.D. [facility] Office: [phone] Pager:  [phone]

## 2017-06-08 NOTE — Interval H&P Note (Signed)
History and Physical Interval Note:  06/08/2017 12:52 PM  Christian Bailey  has presented today for surgery, with the diagnosis of pvd with claudication  The various methods of treatment have been discussed with the patient and family. After consideration of risks, benefits and other options for treatment, the patient has consented to  Procedure(s): ABDOMINAL AORTOGRAM W/LOWER EXTREMITY (N/A) as a surgical intervention .  The patient's history has been reviewed, patient examined, no change in status, stable for surgery.  I have reviewed the patient's chart and labs.  Questions were answered to the patient's satisfaction.     [name]

## 2017-06-08 NOTE — Progress Notes (Signed)
Up and walked and tolerated well; left groin stable, no bleeding or hematoma 

## 2017-06-09 ENCOUNTER — Encounter (HOSPITAL_COMMUNITY): Payer: Self-pay | Admitting: Surgery

## 2017-06-09 ENCOUNTER — Encounter: Payer: Self-pay | Admitting: Gastroenterology

## 2017-06-10 ENCOUNTER — Telehealth: Payer: Self-pay | Admitting: Surgery

## 2017-06-10 NOTE — Telephone Encounter (Signed)
Sched appt 09/20/17; lab at 1:00 and MD at 2:15. Mailed letter.

## 2017-06-10 NOTE — Telephone Encounter (Signed)
-----   Message from Mena Goes, RN sent at 06/09/2017 11:01 AM EST ----- Regarding: 3 months w/ lab   ----- Message ----- From: Serafina Mitchell, MD Sent: 06/08/2017   9:56 PM To: [facility] Charge Pool  06/08/2017:  Surgeon:  [name] Procedure Performed:  1.  Ultrasound-guided access, left femoral artery  2.  Abdominal aortogram  3.  Right lower extremity runoff  4.  Drug-coated balloon angioplasty, right common femoral artery  5.  Drug-coated balloon angioplasty, right superficial femoral artery   6.  conscious sedation (77 minutes)  Schedule follow-up with me in 3 months.  He will need ABIs and a right lower extremity duplex

## 2017-06-11 ENCOUNTER — Other Ambulatory Visit: Payer: Self-pay

## 2017-06-11 DIAGNOSIS — I739 Peripheral vascular disease, unspecified: Secondary | ICD-10-CM

## 2017-06-16 ENCOUNTER — Telehealth: Payer: Self-pay | Admitting: Cardiovascular Disease

## 2017-06-16 NOTE — Telephone Encounter (Signed)
°  New Prob  Has some questions regarding his upcoming appointment tomorrow. Please call.

## 2017-06-16 NOTE — Telephone Encounter (Signed)
Returned call to patient he wanted to know when he is scheduled to have dopplers of left leg.Advised he is scheduled for lower ext dopplers 09/20/17 at 2:00 pm.

## 2017-06-17 ENCOUNTER — Ambulatory Visit (HOSPITAL_COMMUNITY)
Admission: RE | Admit: 2017-06-17 | Discharge: 2017-06-17 | Disposition: A | Discharge status: Home / Self Care | Payer: Medicare Other | Source: Ambulatory Visit | Attending: Cardiology | Admitting: Cardiology

## 2017-06-17 DIAGNOSIS — I6523 Occlusion and stenosis of bilateral carotid arteries: Secondary | ICD-10-CM | POA: Diagnosis not present

## 2017-06-17 DIAGNOSIS — I6521 Occlusion and stenosis of right carotid artery: Secondary | ICD-10-CM

## 2017-06-19 ENCOUNTER — Other Ambulatory Visit: Payer: Self-pay | Admitting: Cardiovascular Disease

## 2017-06-21 NOTE — Telephone Encounter (Signed)
REFILL 

## 2017-06-23 ENCOUNTER — Telehealth: Payer: Self-pay | Admitting: *Deleted

## 2017-06-23 NOTE — Telephone Encounter (Signed)
-----   Message from Lorretta Harp, MD sent at 06/21/2017 12:02 PM EST ----- No change from prior study. Repeat in 12 months.

## 2017-06-23 NOTE — Telephone Encounter (Signed)
We can get LEAs

## 2017-06-23 NOTE — Telephone Encounter (Signed)
Patient aware of results and verbalized understanding.   Patient concerned that he has not had a LE doppler on the left leg in over a year.   States he is seeing [facility] for his right leg and he is following dopplers on this leg only.    Wanted to make sure he doesn't need to have [place] on left leg as well as he has had issues with this leg for years as well.     Advised I would route to Dr. Gwenlyn Found for review and recommendations.

## 2017-06-28 ENCOUNTER — Other Ambulatory Visit: Payer: Self-pay | Admitting: Cardiovascular Disease

## 2017-06-28 DIAGNOSIS — I6521 Occlusion and stenosis of right carotid artery: Secondary | ICD-10-CM

## 2017-06-28 DIAGNOSIS — I739 Peripheral vascular disease, unspecified: Secondary | ICD-10-CM

## 2017-06-28 NOTE — Telephone Encounter (Signed)
Spoke to pt to inform of Dr. Gwenlyn Found recommendation. Pt was agreeable. Told pt someone will call him to schedule this. Pt verbalized thanks.  Order placed for LE doppler on left leg.

## 2017-07-07 ENCOUNTER — Ambulatory Visit (HOSPITAL_COMMUNITY)
Admission: RE | Admit: 2017-07-07 | Discharge: 2017-07-07 | Disposition: A | Discharge status: Home / Self Care | Payer: Medicare Other | Source: Ambulatory Visit | Attending: Cardiovascular Disease | Admitting: Cardiovascular Disease

## 2017-07-07 DIAGNOSIS — I739 Peripheral vascular disease, unspecified: Secondary | ICD-10-CM | POA: Diagnosis not present

## 2017-07-14 ENCOUNTER — Other Ambulatory Visit: Payer: Self-pay | Admitting: Cardiovascular Disease

## 2017-07-14 DIAGNOSIS — I739 Peripheral vascular disease, unspecified: Secondary | ICD-10-CM

## 2017-07-14 DIAGNOSIS — M48062 Spinal stenosis, lumbar region with neurogenic claudication: Secondary | ICD-10-CM | POA: Diagnosis not present

## 2017-07-14 DIAGNOSIS — Z6825 Body mass index (BMI) 25.0-25.9, adult: Secondary | ICD-10-CM | POA: Diagnosis not present

## 2017-07-26 DIAGNOSIS — D62 Acute posthemorrhagic anemia: Secondary | ICD-10-CM | POA: Diagnosis not present

## 2017-07-26 DIAGNOSIS — Z1389 Encounter for screening for other disorder: Secondary | ICD-10-CM | POA: Diagnosis not present

## 2017-07-26 DIAGNOSIS — Z6825 Body mass index (BMI) 25.0-25.9, adult: Secondary | ICD-10-CM | POA: Diagnosis not present

## 2017-07-26 DIAGNOSIS — M79604 Pain in right leg: Secondary | ICD-10-CM | POA: Diagnosis not present

## 2017-07-26 DIAGNOSIS — M79605 Pain in left leg: Secondary | ICD-10-CM | POA: Diagnosis not present

## 2017-07-26 DIAGNOSIS — E1151 Type 2 diabetes mellitus with diabetic peripheral angiopathy without gangrene: Secondary | ICD-10-CM | POA: Diagnosis not present

## 2017-07-26 DIAGNOSIS — I7389 Other specified peripheral vascular diseases: Secondary | ICD-10-CM | POA: Diagnosis not present

## 2017-07-26 DIAGNOSIS — I10 Essential (primary) hypertension: Secondary | ICD-10-CM | POA: Diagnosis not present

## 2017-08-09 ENCOUNTER — Telehealth: Payer: Self-pay | Admitting: *Deleted

## 2017-08-09 NOTE — Telephone Encounter (Signed)
Patient called c/o right leg pain and wants to see Dr. Liz Beach color and temperatue is good and equal to left. Was offered appointment to see [name] today or March 4th.  Stated he would take the March appointment. Instructed to go to the ER for any worsening condition of emergency.

## 2017-08-11 DIAGNOSIS — M48062 Spinal stenosis, lumbar region with neurogenic claudication: Secondary | ICD-10-CM | POA: Diagnosis not present

## 2017-08-12 DIAGNOSIS — L602 Onychogryphosis: Secondary | ICD-10-CM | POA: Diagnosis not present

## 2017-08-12 DIAGNOSIS — H353212 Exudative age-related macular degeneration, right eye, with inactive choroidal neovascularization: Secondary | ICD-10-CM | POA: Diagnosis not present

## 2017-08-12 DIAGNOSIS — E1351 Other specified diabetes mellitus with diabetic peripheral angiopathy without gangrene: Secondary | ICD-10-CM | POA: Diagnosis not present

## 2017-08-12 DIAGNOSIS — H353113 Nonexudative age-related macular degeneration, right eye, advanced atrophic without subfoveal involvement: Secondary | ICD-10-CM | POA: Diagnosis not present

## 2017-08-12 DIAGNOSIS — L84 Corns and callosities: Secondary | ICD-10-CM | POA: Diagnosis not present

## 2017-08-12 DIAGNOSIS — I739 Peripheral vascular disease, unspecified: Secondary | ICD-10-CM | POA: Diagnosis not present

## 2017-08-12 DIAGNOSIS — H353124 Nonexudative age-related macular degeneration, left eye, advanced atrophic with subfoveal involvement: Secondary | ICD-10-CM | POA: Diagnosis not present

## 2017-08-12 DIAGNOSIS — H353221 Exudative age-related macular degeneration, left eye, with active choroidal neovascularization: Secondary | ICD-10-CM | POA: Diagnosis not present

## 2017-08-30 ENCOUNTER — Encounter: Payer: Self-pay | Admitting: *Deleted

## 2017-08-30 ENCOUNTER — Ambulatory Visit (HOSPITAL_COMMUNITY)
Admission: RE | Admit: 2017-08-30 | Discharge: 2017-08-30 | Disposition: A | Discharge status: Home / Self Care | Payer: Medicare Other | Source: Ambulatory Visit | Attending: Vascular Surgery | Admitting: Vascular Surgery

## 2017-08-30 ENCOUNTER — Other Ambulatory Visit: Payer: Self-pay | Admitting: *Deleted

## 2017-08-30 ENCOUNTER — Encounter: Payer: Self-pay | Admitting: Vascular Surgery

## 2017-08-30 ENCOUNTER — Ambulatory Visit (INDEPENDENT_AMBULATORY_CARE_PROVIDER_SITE_OTHER): Payer: Medicare Other | Admitting: Vascular Surgery

## 2017-08-30 ENCOUNTER — Ambulatory Visit (INDEPENDENT_AMBULATORY_CARE_PROVIDER_SITE_OTHER)
Admission: RE | Admit: 2017-08-30 | Discharge: 2017-08-30 | Disposition: A | Discharge status: Home / Self Care | Payer: Medicare Other | Source: Ambulatory Visit | Attending: Vascular Surgery | Admitting: Vascular Surgery

## 2017-08-30 ENCOUNTER — Other Ambulatory Visit: Payer: Self-pay

## 2017-08-30 VITALS — BP 121/58 | HR 109 | Temp 97.4°F | Resp 20 | Ht 65.0 in | Wt 156.0 lb

## 2017-08-30 DIAGNOSIS — R9439 Abnormal result of other cardiovascular function study: Secondary | ICD-10-CM | POA: Diagnosis not present

## 2017-08-30 DIAGNOSIS — I998 Other disorder of circulatory system: Secondary | ICD-10-CM | POA: Diagnosis not present

## 2017-08-30 DIAGNOSIS — I739 Peripheral vascular disease, unspecified: Secondary | ICD-10-CM

## 2017-08-30 DIAGNOSIS — I771 Stricture of artery: Secondary | ICD-10-CM | POA: Diagnosis not present

## 2017-08-30 NOTE — H&P (View-Only) (Signed)
Patient is a 77 year old male who presents for complaints of right leg pain today.  He has an extensive peripheral vascular history.  He has undergone multiple angiograms and interventions in the past.  He had a right femoral to popliteal bypass by [name] on March 2018.  Subsequently he had angiograms in August and December 2018 with narrowings at the proximal anastomosis.  The patient states that despite having prior left leg and right leg procedures he has never really had any resolution of his claudication symptoms.  This dates back to even when he lived in [place] about 10 years ago.  The patient's ABIs on the right leg were 0.2 at the time of his most recent intervention.  These improved to 0.7 post procedure.  Patient states that currently he has pain in his right leg from the knee down after walking about 120 steps.  He states he also has similar findings in the left leg but not as bad as the right.  He has also seen Dr. Ellene Route recently for a neurologic evaluation as a cause of his symptoms.  He states that he got no benefit with an epidural injection or with Neurontin.  He denies rest pain in the foot but states that he is getting to the point where just standing on the leg hurts.  Chronic medical problems include hypertension elevated cholesterol and diabetes.  All of these have been stable.  He is on Plavix and a statin.  Current Outpatient Medications on File Prior to Visit  Medication Sig Dispense Refill  . clopidogrel (PLAVIX) 75 MG tablet TAKE 1 TABLET DAILY 90 tablet 3  . Coenzyme Q10 (COQ10 PO) Take 1 tablet by mouth daily.    [name] FREESTYLE LITE test strip     . gabapentin (NEURONTIN) 300 MG capsule 100 mg 3 (three) times daily.     [name] glucosamine-chondroitin 500-400 MG tablet Take 1 tablet by mouth daily.     [name] lisinopril (PRINIVIL,ZESTRIL) 5 MG tablet Take 5 mg by mouth 2 (two) times daily.     . metFORMIN (GLUCOPHAGE) 1000 MG tablet Take 1 tablet (1,000 mg total) by mouth 2 (two)  times daily with a meal.    . metoprolol succinate (TOPROL-XL) 25 MG 24 hr tablet Take 25 mg by mouth 2 (two) times daily.     . Omega-3 Fatty Acids (FISH OIL) 1200 MG CAPS Take 1,200 mg by mouth every evening.     . pantoprazole (PROTONIX) 40 MG tablet TAKE 2 TABLETS DAILY 180 tablet 3  . rosuvastatin (CRESTOR) 20 MG tablet Take 20 mg by mouth every evening.      Current Facility-Administered Medications on File Prior to Visit  Medication Dose Route Frequency Provider Last Rate Last Dose  . 0.9 %  sodium chloride infusion  500 mL Intravenous Once Nelida Meuse III, MD      . 0.9 %  sodium chloride infusion  500 mL Intravenous Once Doran Stabler, MD         Past Surgical History:  Procedure Laterality Date  . ABDOMINAL AORTOGRAM W/LOWER EXTREMITY N/A 01/27/2017   Procedure: Abdominal Aortogram w/Lower Extremity;  Surgeon: Serafina Mitchell, MD;  Location: [facility];  Service: Cardiovascular;  Laterality: N/A;  . ABDOMINAL AORTOGRAM W/LOWER EXTREMITY N/A 06/08/2017   Procedure: ABDOMINAL AORTOGRAM W/LOWER EXTREMITY;  Surgeon: Serafina Mitchell, MD;  Location: [facility];  Service: Cardiovascular;  Laterality: N/A;  . ANGIOPLASTY / STENTING FEMORAL    .  ANGIOPLASTY / STENTING ILIAC    . CEREBRAL ANGIOGRAM N/A 05/14/2014   Procedure: CEREBRAL ANGIOGRAM;  Surgeon: Lorretta Harp, MD;  Location: [facility] C[facility];  Service: Cardiovascular;  Laterality: N/A;  . COLONOSCOPY W/ POLYPECTOMY    . EYE SURGERY Bilateral    cataract  . FEMORAL ARTERY STENT Right 05/12/2010   Stented distally with a 6x100 Abbott absolute stent and proximally with a 6x60 Cook Zilver stent resulting in the reduction of the proximal segment 80% and mid segment 60-70% to 0% residual, LEFT common femoral artery stented with a 7x3 Smart stent resulting in reduction of 90% stenosis to 0% residual  . FEMORAL-POPLITEAL BYPASS GRAFT Right 09/18/2016   Procedure: BYPASS GRAFT FEMORAL-POPLITEAL ARTERY;  Surgeon:  Serafina Mitchell, MD;  Location: [facility];  Service: Vascular;  Laterality: Right;  . KNEE ARTHROSCOPY     left  . LOWER EXTREMITY ANGIOGRAM N/A 05/14/2014   Procedure: LOWER EXTREMITY ANGIOGRAM;  Surgeon: Lorretta Harp, MD;  Location: [facility] C[facility] LAB;  Service: Cardiovascular;  Laterality: N/A;  . LOWER EXTREMITY ANGIOGRAPHY N/A 09/21/2016   Procedure: Lower Extremity Angiography;  Surgeon: Conrad [place], MD;  Location: [place];  Service: Cardiovascular;  Laterality: N/A;  . PERIPHERAL VASCULAR ATHERECTOMY Right 01/27/2017   Procedure: PERIPHERAL VASCULAR ATHERECTOMY;  Surgeon: Serafina Mitchell, MD;  Location: [facility];  Service: Cardiovascular;  Laterality: Right;  . PERIPHERAL VASCULAR BALLOON ANGIOPLASTY Right 06/08/2017   Procedure: PERIPHERAL VASCULAR BALLOON ANGIOPLASTY;  Surgeon: Serafina Mitchell, MD;  Location: [facility];  Service: Cardiovascular;  Laterality: Right;  common femoral and superficial femoral arteries  . PERIPHERAL VASCULAR CATHETERIZATION N/A 04/20/2016   Procedure: Lower Extremity Intervention;  Surgeon: Lorretta Harp, MD;  Location: [facility];  Service: Cardiovascular;  Laterality: N/A;  . REVERSE SHOULDER ARTHROPLASTY Right 12/07/2016  . REVERSE SHOULDER ARTHROPLASTY Right 12/07/2016   Procedure: REVERSE SHOULDER ARTHROPLASTY;  Surgeon: Netta Cedars, MD;  Location: [facility];  Service: Orthopedics;  Laterality: Right;  . ROTATOR CUFF REPAIR Right 2003  . SFA Right 05/14/2014   PTA  OF RT SFA         DR BERRY   Past Medical History:  Diagnosis Date  . Anemia   . Complication of anesthesia   . Diabetes mellitus ([place])    TYPE 2  . GERD (gastroesophageal reflux disease)   . GI bleed   . History of blood transfusion    GI bleed  . History of colon polyps   . History of hiatal hernia   . Hyperlipemia   . Hypertension   . Osteoarthritis   . PAD (peripheral artery disease) ([facility])    a. stenting of his left common iliac artery >20  years ago. b. h/o LEIA stent and 2 stents to R SFA in 2011. c. 04/2014:  s/p PTA of right SFA for in-stent restenosis, occluded left SFA  . PONV (postoperative nausea and vomiting)    no porblem with the last 3 surgeries  . RBBB (right bundle branch block with left anterior fascicular block)    NUCLEAR STRESS TEST, 08/18/2010 - no significant wall motion abnoramlities noted, post-stress EF 69%, normal myocardial perfusion study  . Sinus tachycardia    a. Noted during admission 04/2014 but upon review seems to be frequent finding for patient.  . Stenosis of carotid artery    a. 50% right carotid stenosis by angiogram 04/2014.   Allergies  Allergen Reactions  . Diona Fanti [  Aspirin] Other (See Comments)    GI bleeding  . Gadolinium Derivatives Other (See Comments)    Pt complained of face flushing/hottness and throat tightness/scratchiness immediately after the injections.  Within 4 minutes, all symptoms were gone and the study was completed.  No further complications or signs of allergy were exhibited after completion of study.   . Oxycodone-Acetaminophen Other (See Comments)    Dizziness, uncomfortable     Social History   Socioeconomic History  . Marital status: Married    Spouse name: Not on file  . Number of children: 2  . Years of education: Not on file  . Highest education level: Not on file  Social Needs  . Financial resource strain: Not on file  . Food insecurity - worry: Not on file  . Food insecurity - inability: Not on file  . Transportation needs - medical: Not on file  . Transportation needs - non-medical: Not on file  Occupational History  . Occupation: retired    Fish farm manager: RETIRED  Tobacco Use  . Smoking status: Former Smoker    Years: 25.00    Last attempt to quit: 06/1988    Years since quitting: 29.1  . Smokeless tobacco: Never Used  Substance and Sexual Activity  . Alcohol use: Yes    Comment: rarely  . Drug use: No  . Sexual activity: Not on file  Other  Topics Concern  . Not on file  Social History Narrative  . Not on file    Physical exam:  Vitals:   08/30/17 0833  BP: (!) 121/58  Pulse: (!) 109  Resp: 20  Temp: (!) 97.4 F (36.3 C)  TempSrc: Oral  SpO2: 96%  Weight: 156 lb (70.8 kg)  Height: 5\' 5"  (1.651 m)    HEENT: Negative  Neck: No carotid bruits  Chest: Clear to auscultation bilaterally  Cardiac: Regular rate and rhythm without murmur  Abdomen: Soft nontender no mass  Extremities: 1+ left femoral absent right femoral pulse 2+ radial pulses bilaterally no popliteal or pedal pulses  Skin: No open ulcers or rashes on the feet  Data: Patient had a duplex ultrasound of his bypass graft today which showed a high-grade stenosis at the proximal aspect of the graft with velocities of 597 cm/s.  ABIs on the right side were 0.2 today.  Left ABI was 0.62.  I reviewed the patient's most recent arteriogram from December which showed a good technical result from the narrowing at the proximal aspect although he does have a at least 50% narrowing of his profunda origin as well.  Assessment: Recurrent stenosis proximal vein graft and common femoral artery with his profunda stenosis probably contributing to the significant decline in his ABIs when he has a narrowing as he is more SFA dependent since he does not have a robust development of his profunda system.  Plan: Abdominal aortogram with lower extremity runoff possible intervention by [name] tomorrow.  I discussed with the patient today that he may be a candidate for further percutaneous intervention but may also need to consider surgical revision and that will be at the discretion of [name].  Ruta Hinds, MD [facility] Office: [phone] Pager: [phone]

## 2017-08-30 NOTE — Progress Notes (Signed)
Patient is a 77 year old male who presents for complaints of right leg pain today.  He has an extensive peripheral vascular history.  He has undergone multiple angiograms and interventions in the past.  He had a right femoral to popliteal bypass by [name] on March 2018.  Subsequently he had angiograms in August and December 2018 with narrowings at the proximal anastomosis.  The patient states that despite having prior left leg and right leg procedures he has never really had any resolution of his claudication symptoms.  This dates back to even when he lived in [place] about 10 years ago.  The patient's ABIs on the right leg were 0.2 at the time of his most recent intervention.  These improved to 0.7 post procedure.  Patient states that currently he has pain in his right leg from the knee down after walking about 120 steps.  He states he also has similar findings in the left leg but not as bad as the right.  He has also seen Dr. Ellene Route recently for a neurologic evaluation as a cause of his symptoms.  He states that he got no benefit with an epidural injection or with Neurontin.  He denies rest pain in the foot but states that he is getting to the point where just standing on the leg hurts.  Chronic medical problems include hypertension elevated cholesterol and diabetes.  All of these have been stable.  He is on Plavix and a statin.  Current Outpatient Medications on File Prior to Visit  Medication Sig Dispense Refill  . clopidogrel (PLAVIX) 75 MG tablet TAKE 1 TABLET DAILY 90 tablet 3  . Coenzyme Q10 (COQ10 PO) Take 1 tablet by mouth daily.    [name] FREESTYLE LITE test strip     . gabapentin (NEURONTIN) 300 MG capsule 100 mg 3 (three) times daily.     [name] glucosamine-chondroitin 500-400 MG tablet Take 1 tablet by mouth daily.     [name] lisinopril (PRINIVIL,ZESTRIL) 5 MG tablet Take 5 mg by mouth 2 (two) times daily.     . metFORMIN (GLUCOPHAGE) 1000 MG tablet Take 1 tablet (1,000 mg total) by mouth 2 (two)  times daily with a meal.    . metoprolol succinate (TOPROL-XL) 25 MG 24 hr tablet Take 25 mg by mouth 2 (two) times daily.     . Omega-3 Fatty Acids (FISH OIL) 1200 MG CAPS Take 1,200 mg by mouth every evening.     . pantoprazole (PROTONIX) 40 MG tablet TAKE 2 TABLETS DAILY 180 tablet 3  . rosuvastatin (CRESTOR) 20 MG tablet Take 20 mg by mouth every evening.      Current Facility-Administered Medications on File Prior to Visit  Medication Dose Route Frequency Provider Last Rate Last Dose  . 0.9 %  sodium chloride infusion  500 mL Intravenous Once Nelida Meuse III, MD      . 0.9 %  sodium chloride infusion  500 mL Intravenous Once Doran Stabler, MD         Past Surgical History:  Procedure Laterality Date  . ABDOMINAL AORTOGRAM W/LOWER EXTREMITY N/A 01/27/2017   Procedure: Abdominal Aortogram w/Lower Extremity;  Surgeon: Serafina Mitchell, MD;  Location: [facility];  Service: Cardiovascular;  Laterality: N/A;  . ABDOMINAL AORTOGRAM W/LOWER EXTREMITY N/A 06/08/2017   Procedure: ABDOMINAL AORTOGRAM W/LOWER EXTREMITY;  Surgeon: Serafina Mitchell, MD;  Location: [place];  Service: Cardiovascular;  Laterality: N/A;  . ANGIOPLASTY / STENTING FEMORAL    .  ANGIOPLASTY / STENTING ILIAC    . CEREBRAL ANGIOGRAM N/A 05/14/2014   Procedure: CEREBRAL ANGIOGRAM;  Surgeon: Lorretta Harp, MD;  Location: [facility];  Service: Cardiovascular;  Laterality: N/A;  . COLONOSCOPY W/ POLYPECTOMY    . EYE SURGERY Bilateral    cataract  . FEMORAL ARTERY STENT Right 05/12/2010   Stented distally with a 6x100 Abbott absolute stent and proximally with a 6x60 Cook Zilver stent resulting in the reduction of the proximal segment 80% and mid segment 60-70% to 0% residual, LEFT common femoral artery stented with a 7x3 Smart stent resulting in reduction of 90% stenosis to 0% residual  . FEMORAL-POPLITEAL BYPASS GRAFT Right 09/18/2016   Procedure: BYPASS GRAFT FEMORAL-POPLITEAL ARTERY;  Surgeon:  Serafina Mitchell, MD;  Location: [facility];  Service: Vascular;  Laterality: Right;  . KNEE ARTHROSCOPY     left  . LOWER EXTREMITY ANGIOGRAM N/A 05/14/2014   Procedure: LOWER EXTREMITY ANGIOGRAM;  Surgeon: Lorretta Harp, MD;  Location: [facility] C[facility] LAB;  Service: Cardiovascular;  Laterality: N/A;  . LOWER EXTREMITY ANGIOGRAPHY N/A 09/21/2016   Procedure: Lower Extremity Angiography;  Surgeon: Conrad Thermalito, MD;  Location: [place] [facility];  Service: Cardiovascular;  Laterality: N/A;  . PERIPHERAL VASCULAR ATHERECTOMY Right 01/27/2017   Procedure: PERIPHERAL VASCULAR ATHERECTOMY;  Surgeon: Serafina Mitchell, MD;  Location: [facility] [facility];  Service: Cardiovascular;  Laterality: Right;  . PERIPHERAL VASCULAR BALLOON ANGIOPLASTY Right 06/08/2017   Procedure: PERIPHERAL VASCULAR BALLOON ANGIOPLASTY;  Surgeon: Serafina Mitchell, MD;  Location: [place] [facility];  Service: Cardiovascular;  Laterality: Right;  common femoral and superficial femoral arteries  . PERIPHERAL VASCULAR CATHETERIZATION N/A 04/20/2016   Procedure: Lower Extremity Intervention;  Surgeon: Lorretta Harp, MD;  Location: [place] [facility];  Service: Cardiovascular;  Laterality: N/A;  . REVERSE SHOULDER ARTHROPLASTY Right 12/07/2016  . REVERSE SHOULDER ARTHROPLASTY Right 12/07/2016   Procedure: REVERSE SHOULDER ARTHROPLASTY;  Surgeon: Netta Cedars, MD;  Location: [facility];  Service: Orthopedics;  Laterality: Right;  . ROTATOR CUFF REPAIR Right 2003  . SFA Right 05/14/2014   PTA  OF RT SFA         DR BERRY   Past Medical History:  Diagnosis Date  . Anemia   . Complication of anesthesia   . Diabetes mellitus ([place])    TYPE 2  . GERD (gastroesophageal reflux disease)   . GI bleed   . History of blood transfusion    GI bleed  . History of colon polyps   . History of hiatal hernia   . Hyperlipemia   . Hypertension   . Osteoarthritis   . PAD (peripheral artery disease) ([facility])    a. stenting of his left common iliac artery >20  years ago. b. h/o LEIA stent and 2 stents to R SFA in 2011. c. 04/2014:  s/p PTA of right SFA for in-stent restenosis, occluded left SFA  . PONV (postoperative nausea and vomiting)    no porblem with the last 3 surgeries  . RBBB (right bundle branch block with left anterior fascicular block)    NUCLEAR STRESS TEST, 08/18/2010 - no significant wall motion abnoramlities noted, post-stress EF 69%, normal myocardial perfusion study  . Sinus tachycardia    a. Noted during admission 04/2014 but upon review seems to be frequent finding for patient.  . Stenosis of carotid artery    a. 50% right carotid stenosis by angiogram 04/2014.   Allergies  Allergen Reactions  . Diona Fanti [  Aspirin] Other (See Comments)    GI bleeding  . Gadolinium Derivatives Other (See Comments)    Pt complained of face flushing/hottness and throat tightness/scratchiness immediately after the injections.  Within 4 minutes, all symptoms were gone and the study was completed.  No further complications or signs of allergy were exhibited after completion of study.   . Oxycodone-Acetaminophen Other (See Comments)    Dizziness, uncomfortable     Social History   Socioeconomic History  . Marital status: Married    Spouse name: Not on file  . Number of children: 2  . Years of education: Not on file  . Highest education level: Not on file  Social Needs  . Financial resource strain: Not on file  . Food insecurity - worry: Not on file  . Food insecurity - inability: Not on file  . Transportation needs - medical: Not on file  . Transportation needs - non-medical: Not on file  Occupational History  . Occupation: retired    Fish farm manager: RETIRED  Tobacco Use  . Smoking status: Former Smoker    Years: 25.00    Last attempt to quit: 06/1988    Years since quitting: 29.1  . Smokeless tobacco: Never Used  Substance and Sexual Activity  . Alcohol use: Yes    Comment: rarely  . Drug use: No  . Sexual activity: Not on file  Other  Topics Concern  . Not on file  Social History Narrative  . Not on file    Physical exam:  Vitals:   08/30/17 0833  BP: (!) 121/58  Pulse: (!) 109  Resp: 20  Temp: (!) 97.4 F (36.3 C)  TempSrc: Oral  SpO2: 96%  Weight: 156 lb (70.8 kg)  Height: 5\' 5"  (1.651 m)    HEENT: Negative  Neck: No carotid bruits  Chest: Clear to auscultation bilaterally  Cardiac: Regular rate and rhythm without murmur  Abdomen: Soft nontender no mass  Extremities: 1+ left femoral absent right femoral pulse 2+ radial pulses bilaterally no popliteal or pedal pulses  Skin: No open ulcers or rashes on the feet  Data: Patient had a duplex ultrasound of his bypass graft today which showed a high-grade stenosis at the proximal aspect of the graft with velocities of 597 cm/s.  ABIs on the right side were 0.2 today.  Left ABI was 0.62.  I reviewed the patient's most recent arteriogram from December which showed a good technical result from the narrowing at the proximal aspect although he does have a at least 50% narrowing of his profunda origin as well.  Assessment: Recurrent stenosis proximal vein graft and common femoral artery with his profunda stenosis probably contributing to the significant decline in his ABIs when he has a narrowing as he is more SFA dependent since he does not have a robust development of his profunda system.  Plan: Abdominal aortogram with lower extremity runoff possible intervention by [name] tomorrow.  I discussed with the patient today that he may be a candidate for further percutaneous intervention but may also need to consider surgical revision and that will be at the discretion of [name].  Ruta Hinds, MD [facility] Office: [phone] Pager: [phone]

## 2017-08-31 ENCOUNTER — Encounter (HOSPITAL_COMMUNITY): Payer: Self-pay | Admitting: *Deleted

## 2017-08-31 ENCOUNTER — Ambulatory Visit (HOSPITAL_COMMUNITY)
Admission: RE | Admit: 2017-08-31 | Discharge: 2017-08-31 | Disposition: A | Discharge status: Home / Self Care | Payer: Medicare Other | Source: Ambulatory Visit | Attending: Surgery | Admitting: Surgery

## 2017-08-31 ENCOUNTER — Encounter (HOSPITAL_COMMUNITY)
Admission: RE | Disposition: A | Discharge status: Home / Self Care | Payer: Self-pay | Source: Ambulatory Visit | Attending: Surgery

## 2017-08-31 DIAGNOSIS — M1990 Unspecified osteoarthritis, unspecified site: Secondary | ICD-10-CM | POA: Insufficient documentation

## 2017-08-31 DIAGNOSIS — I70211 Atherosclerosis of native arteries of extremities with intermittent claudication, right leg: Secondary | ICD-10-CM | POA: Diagnosis not present

## 2017-08-31 DIAGNOSIS — Z87891 Personal history of nicotine dependence: Secondary | ICD-10-CM | POA: Diagnosis not present

## 2017-08-31 DIAGNOSIS — M79604 Pain in right leg: Secondary | ICD-10-CM | POA: Diagnosis not present

## 2017-08-31 DIAGNOSIS — T82858A Stenosis of vascular prosthetic devices, implants and grafts, initial encounter: Secondary | ICD-10-CM | POA: Insufficient documentation

## 2017-08-31 DIAGNOSIS — Z7902 Long term (current) use of antithrombotics/antiplatelets: Secondary | ICD-10-CM | POA: Insufficient documentation

## 2017-08-31 DIAGNOSIS — E1151 Type 2 diabetes mellitus with diabetic peripheral angiopathy without gangrene: Secondary | ICD-10-CM | POA: Insufficient documentation

## 2017-08-31 DIAGNOSIS — E7800 Pure hypercholesterolemia, unspecified: Secondary | ICD-10-CM | POA: Insufficient documentation

## 2017-08-31 DIAGNOSIS — Z7984 Long term (current) use of oral hypoglycemic drugs: Secondary | ICD-10-CM | POA: Insufficient documentation

## 2017-08-31 DIAGNOSIS — I452 Bifascicular block: Secondary | ICD-10-CM | POA: Insufficient documentation

## 2017-08-31 DIAGNOSIS — I6521 Occlusion and stenosis of right carotid artery: Secondary | ICD-10-CM | POA: Diagnosis not present

## 2017-08-31 DIAGNOSIS — K219 Gastro-esophageal reflux disease without esophagitis: Secondary | ICD-10-CM | POA: Insufficient documentation

## 2017-08-31 DIAGNOSIS — Y831 Surgical operation with implant of artificial internal device as the cause of abnormal reaction of the patient, or of later complication, without mention of misadventure at the time of the procedure: Secondary | ICD-10-CM | POA: Diagnosis not present

## 2017-08-31 DIAGNOSIS — I10 Essential (primary) hypertension: Secondary | ICD-10-CM | POA: Insufficient documentation

## 2017-08-31 HISTORY — PX: ABDOMINAL AORTOGRAM W/LOWER EXTREMITY: CATH118223

## 2017-08-31 HISTORY — PX: PERIPHERAL VASCULAR INTERVENTION: CATH118257

## 2017-08-31 LAB — POCT I-STAT, CHEM 8
BUN: 31 mg/dL — ABNORMAL HIGH (ref 6–20)
CALCIUM ION: 1.2 mmol/L (ref 1.15–1.40)
Chloride: 105 mmol/L (ref 101–111)
Creatinine, Ser: 1.2 mg/dL (ref 0.61–1.24)
Glucose, Bld: 140 mg/dL — ABNORMAL HIGH (ref 65–99)
HCT: 33 % — ABNORMAL LOW (ref 39.0–52.0)
HEMOGLOBIN: 11.2 g/dL — AB (ref 13.0–17.0)
Potassium: 4.4 mmol/L (ref 3.5–5.1)
SODIUM: 143 mmol/L (ref 135–145)
TCO2: 28 mmol/L (ref 22–32)

## 2017-08-31 LAB — GLUCOSE, CAPILLARY: GLUCOSE-CAPILLARY: 149 mg/dL — AB (ref 65–99)

## 2017-08-31 SURGERY — ABDOMINAL AORTOGRAM W/LOWER EXTREMITY
Anesthesia: LOCAL

## 2017-08-31 MED ORDER — HEPARIN SODIUM (PORCINE) 1000 UNIT/ML IJ SOLN
INTRAMUSCULAR | Status: AC
  Filled 2017-08-31: qty 1

## 2017-08-31 MED ORDER — SODIUM CHLORIDE 0.9% FLUSH: 3.0000 mL | Freq: Two times a day (BID) | INTRAVENOUS | Status: DC

## 2017-08-31 MED ORDER — DIPHENHYDRAMINE HCL 50 MG/ML IJ SOLN
25.0000 mg | INTRAMUSCULAR | Status: AC
  Administered 2017-08-31: 25 mg via INTRAVENOUS

## 2017-08-31 MED ORDER — HEPARIN (PORCINE) IN NACL 2-0.9 UNIT/ML-% IJ SOLN
INTRAMUSCULAR | Status: AC
  Filled 2017-08-31: qty 500

## 2017-08-31 MED ORDER — SODIUM CHLORIDE 0.9% FLUSH: 3.0000 mL | INTRAVENOUS | Status: DC | PRN

## 2017-08-31 MED ORDER — MIDAZOLAM HCL 2 MG/2ML IJ SOLN
INTRAMUSCULAR | Status: DC | PRN
  Administered 2017-08-31: 1 mg via INTRAVENOUS

## 2017-08-31 MED ORDER — LABETALOL HCL 5 MG/ML IV SOLN: 10.0000 mg | INTRAVENOUS | Status: DC | PRN

## 2017-08-31 MED ORDER — HEPARIN SODIUM (PORCINE) 1000 UNIT/ML IJ SOLN
INTRAMUSCULAR | Status: DC | PRN
  Administered 2017-08-31: 7000 [IU] via INTRAVENOUS

## 2017-08-31 MED ORDER — IODIXANOL 320 MG/ML IV SOLN
INTRAVENOUS | Status: DC | PRN
  Administered 2017-08-31: 140 mL via INTRA_ARTERIAL

## 2017-08-31 MED ORDER — FENTANYL CITRATE (PF) 100 MCG/2ML IJ SOLN
INTRAMUSCULAR | Status: AC
  Filled 2017-08-31: qty 2

## 2017-08-31 MED ORDER — DIPHENHYDRAMINE HCL 50 MG/ML IJ SOLN
INTRAMUSCULAR | Status: AC
  Administered 2017-08-31: 25 mg via INTRAVENOUS
  Filled 2017-08-31: qty 1

## 2017-08-31 MED ORDER — FENTANYL CITRATE (PF) 100 MCG/2ML IJ SOLN
INTRAMUSCULAR | Status: DC | PRN
  Administered 2017-08-31: 25 ug via INTRAVENOUS

## 2017-08-31 MED ORDER — HYDRALAZINE HCL 20 MG/ML IJ SOLN: 5.0000 mg | INTRAMUSCULAR | Status: DC | PRN

## 2017-08-31 MED ORDER — MIDAZOLAM HCL 2 MG/2ML IJ SOLN
INTRAMUSCULAR | Status: AC
  Filled 2017-08-31: qty 2

## 2017-08-31 MED ORDER — SODIUM CHLORIDE 0.9 % WEIGHT BASED INFUSION: 1.0000 mL/kg/h | INTRAVENOUS | Status: DC

## 2017-08-31 MED ORDER — LIDOCAINE HCL 1 % IJ SOLN
INTRAMUSCULAR | Status: AC
  Filled 2017-08-31: qty 20

## 2017-08-31 MED ORDER — SODIUM CHLORIDE 0.9 % IV SOLN: 250.0000 mL | INTRAVENOUS | Status: DC | PRN

## 2017-08-31 MED ORDER — FAMOTIDINE IN NACL 20-0.9 MG/50ML-% IV SOLN
20.0000 mg | INTRAVENOUS | Status: AC
  Administered 2017-08-31: 20 mg via INTRAVENOUS

## 2017-08-31 MED ORDER — SODIUM CHLORIDE 0.9 % IV SOLN
INTRAVENOUS | Status: DC
  Administered 2017-08-31: 09:00:00 via INTRAVENOUS

## 2017-08-31 MED ORDER — HEPARIN (PORCINE) IN NACL 2-0.9 UNIT/ML-% IJ SOLN
INTRAMUSCULAR | Status: AC | PRN
  Administered 2017-08-31: 500 mL

## 2017-08-31 MED ORDER — FAMOTIDINE IN NACL 20-0.9 MG/50ML-% IV SOLN
INTRAVENOUS | Status: AC
  Administered 2017-08-31: 20 mg via INTRAVENOUS
  Filled 2017-08-31: qty 50

## 2017-08-31 MED ORDER — METHYLPREDNISOLONE SODIUM SUCC 125 MG IJ SOLR
125.0000 mg | INTRAMUSCULAR | Status: AC
  Administered 2017-08-31: 125 mg via INTRAVENOUS

## 2017-08-31 MED ORDER — METHYLPREDNISOLONE SODIUM SUCC 125 MG IJ SOLR
INTRAMUSCULAR | Status: AC
  Administered 2017-08-31: 125 mg via INTRAVENOUS
  Filled 2017-08-31: qty 2

## 2017-08-31 SURGICAL SUPPLY — 21 items
BAG SNAP BAND KOVER 36X36 (MISCELLANEOUS) ×3 IMPLANT
BALLN LUTONIX DCB 5X40X130 (BALLOONS) ×3
BALLOON LUTONIX DCB 5X40X130 (BALLOONS) ×2 IMPLANT
CATH OMNI FLUSH 5F 65CM (CATHETERS) ×3 IMPLANT
COVER DOME SNAP 22 D (MISCELLANEOUS) ×6 IMPLANT
DEVICE CLOSURE MYNXGRIP 6/7F (Vascular Products) ×3 IMPLANT
HEMOSTASIS PAD V PLUS (HEMOSTASIS) ×3 IMPLANT
KIT ENCORE 26 ADVANTAGE (KITS) ×3 IMPLANT
KIT MICROPUNCTURE NIT STIFF (SHEATH) ×3 IMPLANT
KIT PV (KITS) ×3 IMPLANT
SHEATH PINNACLE 5F 10CM (SHEATH) ×3 IMPLANT
SHEATH PINNACLE 6F 10CM (SHEATH) ×3 IMPLANT
SHEATH PINNACLE ST 6F 45CM (SHEATH) ×3 IMPLANT
SHIELD RADPAD SCOOP 12X17 (MISCELLANEOUS) ×3 IMPLANT
STENT INNOVA 6X40X130 (Permanent Stent) ×3 IMPLANT
SYR MEDRAD MARK V 150ML (SYRINGE) ×3 IMPLANT
TAPE RADIOPAQUE TURBO (MISCELLANEOUS) ×3 IMPLANT
TRANSDUCER W/STOPCOCK (MISCELLANEOUS) ×3 IMPLANT
TRAY PV CATH (CUSTOM PROCEDURE TRAY) ×3 IMPLANT
WIRE BENTSON .035X145CM (WIRE) ×3 IMPLANT
WIRE ROSEN-J .035X260CM (WIRE) ×3 IMPLANT

## 2017-08-31 NOTE — Discharge Instructions (Signed)

## 2017-08-31 NOTE — Progress Notes (Signed)
Pt ambulated with no issues. L groin level 0. Pt dressing for D/C.

## 2017-08-31 NOTE — Op Note (Signed)
Patient name: Christian Bailey MRN: [id number] DOB: 14-Aug-1940 Sex: male  08/31/2017 Pre-operative Diagnosis: Right leg pain Post-operative diagnosis:  Same Surgeon:  [name] Procedure Performed:  1.  Ultrasound-guided access, left femoral artery  2.  Abdominal aortogram  3.  Right lower extremity runoff  4.  Second-order catheterization  5.  Drug-coated balloon angioplasty, right external iliac artery  6.  Stent, right external iliac artery  7.  Conscious sedation (82 minutes)   Indications: The patient is a undergone a right femoral-popliteal bypass graft for severe claudication.  He has developed early recurrence and has undergone 2 prior interventions with limited durability.  His most recent ultrasound shows a drop in his ABIs any recurrent stenosis.  He is here for further evaluation.  Procedure:  The patient was identified in the holding area and taken to room 8.  The patient was then placed supine on the table and prepped and draped in the usual sterile fashion.  A time out was called.  Conscious sedation was administered with the use of IV fentanyl and Versed under continuous physician and nurse monitoring.  Heart rate, blood pressure, and oxygen saturation were continuously monitored.  Ultrasound was used to evaluate the left common femoral artery.  It was patent .  A digital ultrasound image was acquired.  A micropuncture needle was used to access the left common femoral artery under ultrasound guidance.  An 018 wire was advanced without resistance and a micropuncture sheath was placed.  The 018 wire was removed and a benson wire was placed.  The micropuncture sheath was exchanged for a 5 french sheath.  An omniflush catheter was advanced over the wire to the level of L-1.  An abdominal angiogram was obtained.  Next, using the omniflush catheter and a benson wire, the aortic bifurcation was crossed and the catheter was placed into theright external iliac artery and right runoff was  obtained.    Findings:   Aortogram:  No significant renal artery stenosis was identified.  The infrarenal abdominal aorta is widely patent.  The left iliac system with its associated stents are widely patent.  The right common iliac artery is widely patent.  There is a high-grade lesion approximate 95% at the origin of the right hypogastric artery.  There is a 50 mm gradient lesion in the proximal right external iliac artery.  Right Lower Extremity:  The right common femoral artery is widely patent.  There is a near total occlusion of the origin of the profundofemoral artery.  There is a recurrent Stenosis within the  Left Lower Extremity: Not evaluated  Intervention: After the above images were acquired the decision was made to proceed with intervention.  Over an 035 wire, a 6 French 45 cm sheath was advanced into the right external iliac artery.  The patient was then fully heparinized.  I elected to perform primary drug-coated balloon angioplasty across the iliac lesion.  A 5 x 40 Lutonix balloon was used to perform drug-coated balloon angioplasty taking the balloon to 7 atm for 2 minutes.  Completion imaging was then performed.  There was a non-flow-limiting dissection.  I elected to reinserted the balloon and repeated balloon angioplasty at nominal pressure for 3 minutes.  Follow-up imaging revealed persistence of the dissection.  I felt that this needed to be stented.  Therefore a 6 x 59 INNOVA was inserted and then deployed across the dissection.  It was molded to confirmation with a 5 mm balloon.  Follow-up imaging revealed resolution  of the dissection and a widely patent right external iliac artery.  At this point there did not appear to be as robust of perfusion to the profundofemoral artery.  I suspected that this could have been spasm secondary to the [facility] wire.  I elected not to proceed further as I feel that the patient needs surgical intervention in the right groin  Impression:  #1   Recurrent proximal bypass graft stenosis on the right  #2  Near total occlusion of the right profundofemoral artery  #3  Patient will be scheduled for right femoral endarterectomy  #4  50 mm gradient across the proximal right external iliac artery was initially treated with drug-coated balloon angioplasty using a 5 mm balloon however there was residual dissection which required stenting using a 6 x 40 INNOVA    V. [name], M.D. [facility] Office: [phone] Pager:  [phone]

## 2017-08-31 NOTE — Interval H&P Note (Signed)
History and Physical Interval Note:  08/31/2017 9:51 AM  Christian Bailey  has presented today for surgery, with the diagnosis of pad  The various methods of treatment have been discussed with the patient and family. After consideration of risks, benefits and other options for treatment, the patient has consented to  Procedure(s): LOWER EXTREMITY ANGIOGRAPHY (N/A) as a surgical intervention .  The patient's history has been reviewed, patient examined, no change in status, stable for surgery.  I have reviewed the patient's chart and labs.  Questions were answered to the patient's satisfaction.     [name]

## 2017-09-01 ENCOUNTER — Encounter (HOSPITAL_COMMUNITY): Payer: Self-pay | Admitting: Surgery

## 2017-09-01 ENCOUNTER — Other Ambulatory Visit: Payer: Self-pay | Admitting: *Deleted

## 2017-09-01 ENCOUNTER — Telehealth: Payer: Self-pay | Admitting: *Deleted

## 2017-09-01 NOTE — Telephone Encounter (Signed)
Call to patient instructed to be at [facility] Admitting department at 6:30 am on 09/08/17 for surgery. Instructed to hold Plavix past 09/02/17 dose(5 days). NPO pas MN night prior. Expect a call and follow the detailed instructions received from the hospital pre-admission department about this surgery. Verbalized understanding.

## 2017-09-03 NOTE — Pre-Procedure Instructions (Signed)
Drewey Begue  09/03/2017      [id number] [address] - [place], [place] - [place]. [place] [address] Phone: [phone] Fax: [phone]  [id number] [address], [place]. [place] [address] Phone: [phone] Fax: [place] [address], [place] - [place] [address] Phone: [phone] Fax: [phone]  [facility] [place], [place] [address] [place] [address] Phone: [phone] Fax: [phone]    Your procedure is scheduled on March 13  Report to [place] at  630 A.M.  Call this number if you have problems the morning of surgery:  [phone]   Remember:  Do not eat food or drink liquids after midnight.  Take these medicines the morning of surgery with A SIP OF WATER Gabapentin (Neurontin), Metoprolol Succinate (Toprol-xl), Pantoprazole (Protonix)   Stop taking plavix as directed by your Dr.   Stop taking aspirin, BC's, Goody's, herbal medications, Fish Oil, Aleve, Ibuprofen, Advil, Motrin    How to Manage Your Diabetes Before and After Surgery  Why is it important to control my blood sugar before and after surgery? . Improving blood sugar levels before and after surgery helps healing and can limit problems. . A way of improving blood sugar control is eating a healthy diet by: o  Eating less sugar and carbohydrates o  Increasing activity/exercise o  Talking with your doctor about reaching your blood sugar goals . High blood sugars (greater than 180 mg/dL) can raise your risk of infections and slow your recovery, so you will need to focus on controlling your diabetes during the weeks before surgery. . Make sure that the doctor who takes care of your diabetes knows about your planned  surgery including the date and location.  How do I manage my blood sugar before surgery? . Check your blood sugar at least 4 times a day, starting 2 days before surgery, to make sure that the level is not too high or low. o Check your blood sugar the morning of your surgery when you wake up and every 2 hours until you get to the Short Stay unit. . If your blood sugar is less than 70 mg/dL, you will need to treat for low blood sugar: o Do not take insulin. o Treat a low blood sugar (less than 70 mg/dL) with  cup of clear juice (cranberry or apple), 4 glucose tablets, OR glucose gel. Recheck blood sugar in 15 minutes after treatment (to make sure it is greater than 70 mg/dL). If your blood sugar is not greater than 70 mg/dL on recheck, call [phone] o  for further instructions. . Report your blood sugar to the short stay nurse when you get to Short Stay.  . If you are admitted to the hospital after surgery: o Your blood sugar will be checked by the staff and you will probably be given insulin after surgery (instead of oral diabetes medicines) to make sure you have good blood sugar levels. o The goal for blood sugar control after surgery is 80-180 mg/dL.              WHAT DO I DO ABOUT MY DIABETES MEDICATION?   [name] Do not take oral diabetes medicines (pills) the morning of surgery. Metformin (Glucophage)   . THE NIGHT BEFORE SURGERY, take ___________ units of  ___________insulin.       . THE MORNING OF SURGERY, take _____________ units of __________insulin.  . The day of surgery, do not take other diabetes injectables, including Byetta (exenatide), Bydureon (exenatide ER), Victoza (liraglutide), or Trulicity (dulaglutide).  . If your CBG is greater than 220 mg/dL, you may take  of your sliding scale (correction) dose of insulin.  Other Instructions:          Patient Signature:  Date:   Nurse Signature:  Date:   Reviewed and Endorsed by [facility] Patient Education  Committee, August 2015  Do not wear jewelry, make-up or nail polish.  Do not wear lotions, powders, or perfumes, or deodorant.  Do not shave 48 hours prior to surgery.  Men may shave face and neck.  Do not bring valuables to the hospital.  [facility] is not responsible for any belongings or valuables.  Contacts, dentures or bridgework may not be worn into surgery.  Leave your suitcase in the car.  After surgery it may be brought to your room.  For patients admitted to the hospital, discharge time will be determined by your treatment team.  Patients discharged the day of surgery will not be allowed to drive home.   Special instructions:   [place]- Preparing For Surgery  Before surgery, you can play an important role. Because skin is not sterile, your skin needs to be as free of germs as possible. You can reduce the number of germs on your skin by washing with CHG (chlorahexidine gluconate) Soap before surgery.  CHG is an antiseptic cleaner which kills germs and bonds with the skin to continue killing germs even after washing.  Please do not use if you have an allergy to CHG or antibacterial soaps. If your skin becomes reddened/irritated stop using the CHG.  Do not shave (including legs and underarms) for at least 48 hours prior to first CHG shower. It is OK to shave your face.  Please follow these instructions carefully.   1. Shower the NIGHT BEFORE SURGERY and the MORNING OF SURGERY with CHG.   2. If you chose to wash your hair, wash your hair first as usual with your normal shampoo.  3. After you shampoo, rinse your hair and body thoroughly to remove the shampoo.  4. Use CHG as you would any other liquid soap. You can apply CHG directly to the skin and wash gently with a scrungie or a clean washcloth.   5. Apply the CHG Soap to your body ONLY FROM THE NECK DOWN.  Do not use on open wounds or open sores. Avoid contact with your eyes, ears, mouth and genitals (private parts). Wash  Face and genitals (private parts)  with your normal soap.  6. Wash thoroughly, paying special attention to the area where your surgery will be performed.  7. Thoroughly rinse your body with warm water from the neck down.  8. DO NOT shower/wash with your normal soap after using and rinsing off the CHG Soap.  9. Pat yourself dry with a CLEAN TOWEL.  10. Wear CLEAN PAJAMAS to bed the night before surgery, wear comfortable clothes the morning of surgery  11. Place CLEAN SHEETS on your bed the night of your first shower and DO NOT SLEEP WITH PETS.    Day of Surgery: Do not apply any deodorants/lotions. Please wear clean clothes to the hospital/surgery center.      Please read over the following fact sheets that you were given. Pain Booklet, Coughing and  Deep Breathing, MRSA Information and Surgical Site Infection Prevention

## 2017-09-04 ENCOUNTER — Telehealth: Payer: Self-pay | Admitting: Vascular Surgery

## 2017-09-04 NOTE — Telephone Encounter (Signed)
The patient called on Saturday morning.  Reports that his left femoral puncture site had more swelling than he had experienced that on the prior catheterization of that he has had.  No pain and no limitation as far as walking goes.  He was concerned that this may cause difficulty with his upcoming right femoral endarterectomy.  He does not have any ischemic symptoms.  He is to have preadmission testing on Monday.  I asked him to bring to the attention of the he needed but this should not cause any difficulty from his planned surgery on 09/08/2017

## 2017-09-06 ENCOUNTER — Ambulatory Visit (INDEPENDENT_AMBULATORY_CARE_PROVIDER_SITE_OTHER)
Admission: RE | Admit: 2017-09-06 | Discharge: 2017-09-06 | Disposition: A | Discharge status: Home / Self Care | Payer: Medicare Other | Source: Ambulatory Visit | Attending: Vascular Surgery | Admitting: Vascular Surgery

## 2017-09-06 ENCOUNTER — Encounter (HOSPITAL_COMMUNITY): Payer: Self-pay

## 2017-09-06 ENCOUNTER — Other Ambulatory Visit: Payer: Self-pay

## 2017-09-06 ENCOUNTER — Encounter (HOSPITAL_COMMUNITY)
Admission: RE | Admit: 2017-09-06 | Discharge: 2017-09-06 | Disposition: A | Discharge status: Home / Self Care | Payer: Medicare Other | Source: Ambulatory Visit | Attending: Surgery | Admitting: Surgery

## 2017-09-06 DIAGNOSIS — R1909 Other intra-abdominal and pelvic swelling, mass and lump: Secondary | ICD-10-CM | POA: Diagnosis not present

## 2017-09-06 DIAGNOSIS — Z7984 Long term (current) use of oral hypoglycemic drugs: Secondary | ICD-10-CM | POA: Insufficient documentation

## 2017-09-06 DIAGNOSIS — Z79899 Other long term (current) drug therapy: Secondary | ICD-10-CM | POA: Insufficient documentation

## 2017-09-06 DIAGNOSIS — Z01818 Encounter for other preprocedural examination: Secondary | ICD-10-CM

## 2017-09-06 DIAGNOSIS — E785 Hyperlipidemia, unspecified: Secondary | ICD-10-CM

## 2017-09-06 DIAGNOSIS — Z9889 Other specified postprocedural states: Secondary | ICD-10-CM | POA: Insufficient documentation

## 2017-09-06 DIAGNOSIS — Z96611 Presence of right artificial shoulder joint: Secondary | ICD-10-CM | POA: Insufficient documentation

## 2017-09-06 DIAGNOSIS — E119 Type 2 diabetes mellitus without complications: Secondary | ICD-10-CM | POA: Insufficient documentation

## 2017-09-06 DIAGNOSIS — I10 Essential (primary) hypertension: Secondary | ICD-10-CM

## 2017-09-06 DIAGNOSIS — Z95820 Peripheral vascular angioplasty status with implants and grafts: Secondary | ICD-10-CM | POA: Insufficient documentation

## 2017-09-06 DIAGNOSIS — K219 Gastro-esophageal reflux disease without esophagitis: Secondary | ICD-10-CM

## 2017-09-06 DIAGNOSIS — R000 Tachycardia, unspecified: Secondary | ICD-10-CM

## 2017-09-06 DIAGNOSIS — Z87891 Personal history of nicotine dependence: Secondary | ICD-10-CM | POA: Insufficient documentation

## 2017-09-06 DIAGNOSIS — Z7902 Long term (current) use of antithrombotics/antiplatelets: Secondary | ICD-10-CM

## 2017-09-06 LAB — CBC
HCT: 34.6 % — ABNORMAL LOW (ref 39.0–52.0)
Hemoglobin: 11.4 g/dL — ABNORMAL LOW (ref 13.0–17.0)
MCH: 31.2 pg (ref 26.0–34.0)
MCHC: 32.9 g/dL (ref 30.0–36.0)
MCV: 94.8 fL (ref 78.0–100.0)
PLATELETS: 320 10*3/uL (ref 150–400)
RBC: 3.65 MIL/uL — ABNORMAL LOW (ref 4.22–5.81)
RDW: 13.4 % (ref 11.5–15.5)
WBC: 11.5 10*3/uL — AB (ref 4.0–10.5)

## 2017-09-06 LAB — URINALYSIS, ROUTINE W REFLEX MICROSCOPIC
Bilirubin Urine: NEGATIVE
GLUCOSE, UA: NEGATIVE mg/dL
Hgb urine dipstick: NEGATIVE
KETONES UR: NEGATIVE mg/dL
LEUKOCYTES UA: NEGATIVE
Nitrite: NEGATIVE
PROTEIN: NEGATIVE mg/dL
Specific Gravity, Urine: 1.014 (ref 1.005–1.030)
pH: 5 (ref 5.0–8.0)

## 2017-09-06 LAB — COMPREHENSIVE METABOLIC PANEL
ALBUMIN: 3.4 g/dL — AB (ref 3.5–5.0)
ALT: 14 U/L — ABNORMAL LOW (ref 17–63)
AST: 16 U/L (ref 15–41)
Alkaline Phosphatase: 83 U/L (ref 38–126)
Anion gap: 9 (ref 5–15)
BUN: 25 mg/dL — AB (ref 6–20)
CHLORIDE: 104 mmol/L (ref 101–111)
CO2: 26 mmol/L (ref 22–32)
CREATININE: 1.42 mg/dL — AB (ref 0.61–1.24)
Calcium: 9.4 mg/dL (ref 8.9–10.3)
GFR calc non Af Amer: 46 mL/min — ABNORMAL LOW (ref >60)
GFR, EST AFRICAN AMERICAN: 54 mL/min — AB (ref >60)
GLUCOSE: 160 mg/dL — AB (ref 65–99)
Potassium: 4.5 mmol/L (ref 3.5–5.1)
SODIUM: 139 mmol/L (ref 135–145)
Total Bilirubin: 0.8 mg/dL (ref 0.3–1.2)
Total Protein: 6.9 g/dL (ref 6.5–8.1)

## 2017-09-06 LAB — TYPE AND SCREEN
ABO/RH(D): O POS
Antibody Screen: NEGATIVE

## 2017-09-06 LAB — SURGICAL PCR SCREEN
MRSA, PCR: POSITIVE — AB
STAPHYLOCOCCUS AUREUS: POSITIVE — AB

## 2017-09-06 LAB — APTT: aPTT: 31 seconds (ref 24–36)

## 2017-09-06 LAB — GLUCOSE, CAPILLARY: GLUCOSE-CAPILLARY: 182 mg/dL — AB (ref 65–99)

## 2017-09-06 LAB — PROTIME-INR
INR: 1.04
Prothrombin Time: 13.5 seconds (ref 11.4–15.2)

## 2017-09-06 NOTE — Progress Notes (Addendum)
PCP: Dr. Dagmar Hait  Cardiologist: Dr. Ancil Linsey  EKG: 11/301/18  Stress test: 10 + years ago  ECHO: 10/04/2007 in EP[facility]  Cardiac Cath: pt denies ever  Chest x-ray: pt denies past year, no recent respiratory infections requiring antibiotics.  Pt had an abdominal aorticgram with lower extremity, peripheral vascular intervention done 08/31/17.  The site is swollen, and painful with movement and he called Dr. Stephens Shire office over the weekend (note in Epic). Willeen Cass NP assessed the site and is calling Dr. Stephens Shire office to make them aware of the site condition.   Dr. Stephens Shire office is trying to get someone to evaluate site while patient is here for his preadmission appointment. Advised patient not to leave until instructed due to the above.  Pt was told to hold plavix beginning 09/03/17 per Dr. Stephens Shire office and he has done so. (last dose 09/02/17), patient is allergic to aspirin so he is not taking this medication

## 2017-09-06 NOTE — Progress Notes (Signed)
Adelfo Diebel            09/03/2017                          [id number] [address] - [place], [place] [place] [address] Phone: [phone] Fax: [phone]  [facility], [place]. [place] [address] Phone: [phone] Fax: [place] [address] [place] [address] Phone: [phone] Fax: [phone]  [facility] [place], [place] [address] [place] [address] Phone: [phone] Fax: [phone]              Your procedure is scheduled on September 08, 2017            Report to [facility] at  630 A.M.            Call this number if you have problems the morning of surgery:            [phone]             Remember:            Do not eat food or drink liquids after midnight.            Take these medicines the morning of surgery with A SIP OF WATER Gabapentin (Neurontin), Metoprolol Succinate (Toprol-xl), Pantoprazole (Protonix)   Stop taking plavix as directed by your doctor  Stop taking aspirin, BC's, Goody's, herbal medications, Fish Oil, Aleve, Ibuprofen, Advil, Motrin  WHAT DO I DO ABOUT MY DIABETES MEDICATION?    Do not take oral diabetes medicines (pills) the morning of surgery. Metformin (Glucophage)    The day of surgery, do not take other diabetes injectables, including Byetta (exenatide), Bydureon (exenatide ER), Victoza (liraglutide), or Trulicity (dulaglutide).   HOW TO MANAGE YOUR DIABETES BEFORE AND AFTER SURGERY  Why is it important to control my blood sugar before and after surgery?  Improving blood sugar levels before and after surgery helps healing and can limit problems.  A way of improving blood sugar control is eating a  healthy diet by: ?  Eating less sugar and carbohydrates ?  Increasing activity/exercise ?  Talking with your doctor about reaching your blood sugar goals  High blood sugars (greater than 180 mg/dL) can raise your risk of infections and slow your recovery, so you will need to focus on controlling your diabetes during the weeks before surgery.  Make sure that the doctor who takes care of your diabetes knows about your planned surgery including the date and location.  How do I manage my blood sugar before surgery?  Check your blood sugar at least 4 times a day, starting 2 days before surgery, to make sure that the level is not too high or low. ? Check your blood sugar the morning of your surgery when you wake up and every 2 hours until you get to the Short Stay unit.  If your blood sugar is less than 70 mg/dL, you will need to treat for low blood sugar: ? Do not take insulin. ? Treat a low blood sugar (less than 70 mg/dL) with  cup of clear juice (cranberry or apple), 4glucose tablets, OR glucose gel. Recheck blood sugar in 15 minutes after treatment (to make sure  it is greater than 70 mg/dL). If your blood sugar is not greater than 70 mg/dL on recheck, call [phone] ?  for further instructions.  Report your blood sugar to the short stay nurse when you get to Short Stay.   If you are admitted to the hospital after surgery: ? Your blood sugar will be checked by the staff and you will probably be given insulin after surgery (instead of oral diabetes medicines) to make sure you have good blood sugar levels. ? The goal for blood sugar control after surgery is 80-180 mg/dL.                                                                            Reviewed and Endorsed by [facility] Patient Education Committee, August 2015             Do not wear jewelry, make-up or nail polish.            Do not wear lotions, powders, or perfumes, or deodorant.            Men may shave face and  neck.            Do not bring valuables to the hospital.            [facility] is not responsible for any belongings or valuables.  Contacts, dentures or bridgework may not be worn into surgery.  Leave your suitcase in the car.  After surgery it may be brought to your room.  For patients admitted to the hospital, discharge time will be determined by your treatment team.  Patients discharged the day of surgery will not be allowed to drive home.   Special instructions:   [facility]- Preparing For Surgery  Before surgery, you can play an important role. Because skin is not sterile, your skin needs to be as free of germs as possible. You can reduce the number of germs on your skin by washing with CHG (chlorahexidine gluconate) Soap before surgery.  CHG is an antiseptic cleaner which kills germs and bonds with the skin to continue killing germs even after washing.  Please do not use if you have an allergy to CHG or antibacterial soaps. If your skin becomes reddened/irritated stop using the CHG.  Do not shave (including legs and underarms) for at least 48 hours prior to first CHG shower. It is OK to shave your face.  Please follow these instructions carefully.                                                                                                                     1. Shower the NIGHT BEFORE SURGERY and the MORNING OF SURGERY with CHG.  2. If you chose to wash your hair, wash your hair first as usual with your normal shampoo.  3. After you shampoo, rinse your hair and body thoroughly to remove the shampoo.  4. Use CHG as you would any other liquid soap. You can apply CHG directly to the skin and wash gently with a scrungie or a clean washcloth.   5. Apply the CHG Soap to your body ONLY FROM THE NECK DOWN.  Do not use on open wounds or open sores. Avoid contact with your eyes, ears, mouth and genitals (private parts). Wash Face and genitals (private parts)  with your  normal soap.  6. Wash thoroughly, paying special attention to the area where your surgery will be performed.  7. Thoroughly rinse your body with warm water from the neck down.  8. DO NOT shower/wash with your normal soap after using and rinsing off the CHG Soap.  9. Pat yourself dry with a CLEAN TOWEL.  10. Wear CLEAN PAJAMAS to bed the night before surgery, wear comfortable clothes the morning of surgery  11. Place CLEAN SHEETS on your bed the night of your first shower and DO NOT SLEEP WITH PETS.   Day of Surgery: Do not apply any deodorants/lotions. Please wear clean clothes to the hospital/surgery center.     Please read over the following fact sheets that you were given. Pain Booklet, Coughing and Deep Breathing, MRSA Information and Surgical Site Infection Prevention

## 2017-09-07 NOTE — Anesthesia Preprocedure Evaluation (Addendum)
Anesthesia Evaluation  Patient identified by MRN, date of birth, ID band Patient awake    Reviewed: Allergy & Precautions, NPO status , Patient's Chart, lab work & pertinent test results  History of Anesthesia Complications (+) PONV and history of anesthetic complications  Airway Mallampati: II  TM Distance: >3 FB Neck ROM: Full    Dental no notable dental hx. (+) Teeth Intact   Pulmonary neg pulmonary ROS, former smoker,    Pulmonary exam normal breath sounds clear to auscultation       Cardiovascular hypertension, On Medications and On Home Beta Blockers + Peripheral Vascular Disease  Normal cardiovascular exam+ dysrhythmias  Rhythm:Regular Rate:Normal     Neuro/Psych negative neurological ROS  negative psych ROS   GI/Hepatic Neg liver ROS, hiatal hernia, PUD, GERD  ,  Endo/Other  diabetes, Type 2  Renal/GU Renal InsufficiencyRenal disease  negative genitourinary   Musculoskeletal negative musculoskeletal ROS (+) Arthritis ,   Abdominal   Peds  Hematology  (+) anemia ,   Anesthesia Other Findings   Reproductive/Obstetrics negative OB ROS                             Lab Results  Component Value Date   WBC 11.5 (H) 09/06/2017   HGB 11.4 (L) 09/06/2017   HCT 34.6 (L) 09/06/2017   MCV 94.8 09/06/2017   PLT 320 09/06/2017   Lab Results  Component Value Date   CREATININE 1.42 (H) 09/06/2017   BUN 25 (H) 09/06/2017   NA 139 09/06/2017   K 4.5 09/06/2017   CL 104 09/06/2017   CO2 26 09/06/2017    Anesthesia Physical  Anesthesia Plan  ASA: III  Anesthesia Plan: General   Post-op Pain Management:    Induction: Intravenous  PONV Risk Score and Plan: 3 and Ondansetron, Dexamethasone and Treatment may vary due to age or medical condition  Airway Management Planned: Oral ETT  Additional Equipment:   Intra-op Plan:   Post-operative Plan: Extubation in OR  Informed  Consent: I have reviewed the patients History and Physical, chart, labs and discussed the procedure including the risks, benefits and alternatives for the proposed anesthesia with the patient or authorized representative who has indicated his/her understanding and acceptance.   Dental advisory given  Plan Discussed with: CRNA  Anesthesia Plan Comments:        Anesthesia Quick Evaluation

## 2017-09-07 NOTE — Progress Notes (Signed)
Anesthesia consult:  Pt is a 77 year old scheduled for redo R femoral endarterectomy on 09/08/2017 with Harold Barban, MD  - PCP is Prince Solian, MD - Cardiologist is Quay Burow, M.D. Last office visit 05/28/17   Called to see pt in pre-admission testing for c/o groin swelling after aortogram 08/31/17.  L groin with healing bruising, and large, firm, non-pulsatile area measuring about 10cm long and 2-4cm at either end.  Seems consistent with hematoma.  Pt reports it is not getting bigger or resolving.  Not particularly tender. No warmth or erythema, no evidence of infection. I notified Dr. Oneida Alar who met with pt in pre-admission testing and then ordered an [place] to evaluate for pseudoaneurysm.  Final report documents "No evidence of pseudoaneurysm, AVF or DVT"   PMH includes:  HTN, sinus tachycardia, DM, hyperlipidemia, PAD (s/p a.  left CIA stent >20 years ago. b. h/o LEIA stent and 2 stents to R SFA in 2011. c. 04/2014: s/p PTA of right SFA for in-stent restenosis, occluded left SFA. D. S/p R FPBG 09/18/16), anemia, GI bleed, GERD. Former smoker. BMI 26. S/p R shoulder arthroplasty 12/07/16  Medications include: Pletal, Plavix, lisinopril, metformin, metoprolol, Protonix, rosuvastatin. Stopped plavix 09/03/17.   BP 115/60   Pulse 100   Temp 36.6 C   Resp 18   Ht _0  (1.651 m)   Wt 158 lb (71.7 kg)   SpO2 100%   BMI 26.29 kg/m    Preoperative labs reviewed.   - glucose 160  EKG 05/28/17: Sinus rhythm with PVCs. RBBB.  Carotid duplex 06/17/17:  - Right Carotid: There is evidence in the right ICA of a 60-79% stenosis. - Left Carotid: There is evidence in the left ICA of a 1-39% stenosis. - Vertebrals: Both vertebral arteries were patent with antegrade flow. - Subclavians: Normal flow hemodynamics were seen in bilateral subclavian arteries.  Nuclear stress test 08/18/10: Post-stress myocardial perfusion images show a normal pattern of perfusion in all regions. Post-stress LV  is normal in size. EF 69%. Global LV systolic function normal. No significant wall motion abnormalities. Low risk scan.  Echo 10/04/07:  1. NSR-IVCD 2. Mild concentric LVH. LV systolic function normal. EF >55%. Borderline impaired LV relaxation. 3. RA mildly dilated 4. Mild mitral annular calcification. 5. Aortic valve appears to be mildly sclerotic  If no changes, I anticipate pt can proceed with surgery as scheduled.   Willeen Cass, FNP-BC [facility]/Anesthesiology Phone: [phone] 09/07/2017 9:57 AM

## 2017-09-08 ENCOUNTER — Inpatient Hospital Stay (HOSPITAL_COMMUNITY)
Admission: RE | Admit: 2017-09-08 | Discharge: 2017-09-09 | DRG: 272 | Disposition: A | Discharge status: Home Health | Payer: Medicare Other | Source: Ambulatory Visit | Attending: Surgery | Admitting: Surgery

## 2017-09-08 ENCOUNTER — Encounter (HOSPITAL_COMMUNITY)
Admission: RE | Disposition: A | Discharge status: Home Health | Payer: Self-pay | Source: Ambulatory Visit | Attending: Surgery

## 2017-09-08 ENCOUNTER — Inpatient Hospital Stay (HOSPITAL_COMMUNITY): Payer: Medicare Other | Admitting: Certified Registered"

## 2017-09-08 ENCOUNTER — Inpatient Hospital Stay (HOSPITAL_COMMUNITY): Payer: Medicare Other | Admitting: Emergency Medicine

## 2017-09-08 ENCOUNTER — Encounter (HOSPITAL_COMMUNITY): Payer: Self-pay | Admitting: *Deleted

## 2017-09-08 DIAGNOSIS — K449 Diaphragmatic hernia without obstruction or gangrene: Secondary | ICD-10-CM | POA: Diagnosis not present

## 2017-09-08 DIAGNOSIS — K219 Gastro-esophageal reflux disease without esophagitis: Secondary | ICD-10-CM | POA: Diagnosis present

## 2017-09-08 DIAGNOSIS — I10 Essential (primary) hypertension: Secondary | ICD-10-CM | POA: Diagnosis present

## 2017-09-08 DIAGNOSIS — I739 Peripheral vascular disease, unspecified: Secondary | ICD-10-CM | POA: Diagnosis present

## 2017-09-08 DIAGNOSIS — E1151 Type 2 diabetes mellitus with diabetic peripheral angiopathy without gangrene: Secondary | ICD-10-CM | POA: Diagnosis present

## 2017-09-08 DIAGNOSIS — I70211 Atherosclerosis of native arteries of extremities with intermittent claudication, right leg: Secondary | ICD-10-CM | POA: Diagnosis not present

## 2017-09-08 DIAGNOSIS — E785 Hyperlipidemia, unspecified: Secondary | ICD-10-CM | POA: Diagnosis present

## 2017-09-08 DIAGNOSIS — I70411 Atherosclerosis of autologous vein bypass graft(s) of the extremities with intermittent claudication, right leg: Principal | ICD-10-CM | POA: Diagnosis present

## 2017-09-08 HISTORY — PX: ENDARTERECTOMY: SHX5162

## 2017-09-08 HISTORY — PX: ENDARTERECTOMY FEMORAL: SHX5804

## 2017-09-08 HISTORY — PX: FEMORAL-POPLITEAL BYPASS GRAFT: SHX937

## 2017-09-08 LAB — GLUCOSE, CAPILLARY
GLUCOSE-CAPILLARY: 322 mg/dL — AB (ref 65–99)
Glucose-Capillary: 130 mg/dL — ABNORMAL HIGH (ref 65–99)
Glucose-Capillary: 187 mg/dL — ABNORMAL HIGH (ref 65–99)

## 2017-09-08 LAB — CBC
HEMATOCRIT: 29.9 % — AB (ref 39.0–52.0)
Hemoglobin: 9.7 g/dL — ABNORMAL LOW (ref 13.0–17.0)
MCH: 30.3 pg (ref 26.0–34.0)
MCHC: 32.4 g/dL (ref 30.0–36.0)
MCV: 93.4 fL (ref 78.0–100.0)
Platelets: 292 10*3/uL (ref 150–400)
RBC: 3.2 MIL/uL — ABNORMAL LOW (ref 4.22–5.81)
RDW: 13 % (ref 11.5–15.5)
WBC: 9.9 10*3/uL (ref 4.0–10.5)

## 2017-09-08 LAB — CREATININE, SERUM
Creatinine, Ser: 1.46 mg/dL — ABNORMAL HIGH (ref 0.61–1.24)
GFR calc Af Amer: 52 mL/min — ABNORMAL LOW (ref >60)
GFR calc non Af Amer: 45 mL/min — ABNORMAL LOW (ref >60)

## 2017-09-08 LAB — POCT ACTIVATED CLOTTING TIME: Activated Clotting Time: 197 seconds

## 2017-09-08 SURGERY — ENDARTERECTOMY, FEMORAL
Anesthesia: General | Site: Groin | Laterality: Right

## 2017-09-08 MED ORDER — ONDANSETRON HCL 4 MG/2ML IJ SOLN
INTRAMUSCULAR | Status: AC
  Filled 2017-09-08: qty 2

## 2017-09-08 MED ORDER — PROTAMINE SULFATE 10 MG/ML IV SOLN
INTRAVENOUS | Status: DC | PRN
  Administered 2017-09-08: 50 mg via INTRAVENOUS

## 2017-09-08 MED ORDER — HEMOSTATIC AGENTS (NO CHARGE) OPTIME
TOPICAL | Status: DC | PRN
  Administered 2017-09-08: 1 via TOPICAL

## 2017-09-08 MED ORDER — LIDOCAINE 2% (20 MG/ML) 5 ML SYRINGE
INTRAMUSCULAR | Status: DC | PRN
  Administered 2017-09-08: 40 mg via INTRAVENOUS

## 2017-09-08 MED ORDER — FENTANYL CITRATE (PF) 100 MCG/2ML IJ SOLN: 25.0000 ug | INTRAMUSCULAR | Status: DC | PRN

## 2017-09-08 MED ORDER — PHENOL 1.4 % MT LIQD: 1.0000 | OROMUCOSAL | Status: DC | PRN

## 2017-09-08 MED ORDER — DEXAMETHASONE SODIUM PHOSPHATE 10 MG/ML IJ SOLN
INTRAMUSCULAR | Status: AC
  Filled 2017-09-08: qty 1

## 2017-09-08 MED ORDER — MIDAZOLAM HCL 2 MG/2ML IJ SOLN
INTRAMUSCULAR | Status: AC
  Filled 2017-09-08: qty 2

## 2017-09-08 MED ORDER — SODIUM CHLORIDE 0.9 % IV SOLN: INTRAVENOUS | Status: DC

## 2017-09-08 MED ORDER — POLYETHYLENE GLYCOL 3350 17 G PO PACK: 17.0000 g | PACK | Freq: Every day | ORAL | Status: DC | PRN

## 2017-09-08 MED ORDER — HEPARIN SODIUM (PORCINE) 1000 UNIT/ML IJ SOLN
INTRAMUSCULAR | Status: DC | PRN
  Administered 2017-09-08: 3000 [IU] via INTRAVENOUS
  Administered 2017-09-08: 2000 [IU] via INTRAVENOUS
  Administered 2017-09-08: 1000 [IU] via INTRAVENOUS
  Administered 2017-09-08: 7000 [IU] via INTRAVENOUS

## 2017-09-08 MED ORDER — HEPARIN SODIUM (PORCINE) 5000 UNIT/ML IJ SOLN: 5000.0000 [IU] | Freq: Three times a day (TID) | INTRAMUSCULAR | Status: DC

## 2017-09-08 MED ORDER — CLOPIDOGREL BISULFATE 75 MG PO TABS
75.0000 mg | ORAL_TABLET | Freq: Every day | ORAL | Status: DC
  Administered 2017-09-09: 75 mg via ORAL
  Filled 2017-09-08: qty 1

## 2017-09-08 MED ORDER — PROPOFOL 10 MG/ML IV BOLUS
INTRAVENOUS | Status: AC
  Filled 2017-09-08: qty 20

## 2017-09-08 MED ORDER — HEPARIN SODIUM (PORCINE) 1000 UNIT/ML IJ SOLN
INTRAMUSCULAR | Status: AC
  Filled 2017-09-08: qty 1

## 2017-09-08 MED ORDER — CEFAZOLIN SODIUM-DEXTROSE 2-4 GM/100ML-% IV SOLN
2.0000 g | INTRAVENOUS | Status: AC
  Administered 2017-09-08: 1 g via INTRAVENOUS
  Filled 2017-09-08: qty 100

## 2017-09-08 MED ORDER — PHENYLEPHRINE HCL 10 MG/ML IJ SOLN
INTRAMUSCULAR | Status: DC | PRN
  Administered 2017-09-08: 20 ug/min via INTRAVENOUS

## 2017-09-08 MED ORDER — HYDRALAZINE HCL 20 MG/ML IJ SOLN: 5.0000 mg | INTRAMUSCULAR | Status: DC | PRN

## 2017-09-08 MED ORDER — LISINOPRIL 5 MG PO TABS
5.0000 mg | ORAL_TABLET | Freq: Two times a day (BID) | ORAL | Status: DC
  Administered 2017-09-08 – 2017-09-09 (×2): 5 mg via ORAL
  Filled 2017-09-08 (×2): qty 1

## 2017-09-08 MED ORDER — GUAIFENESIN-DM 100-10 MG/5ML PO SYRP: 15.0000 mL | ORAL_SOLUTION | ORAL | Status: DC | PRN

## 2017-09-08 MED ORDER — METOPROLOL TARTRATE 5 MG/5ML IV SOLN: 2.0000 mg | INTRAVENOUS | Status: DC | PRN

## 2017-09-08 MED ORDER — PANTOPRAZOLE SODIUM 40 MG PO TBEC
80.0000 mg | DELAYED_RELEASE_TABLET | Freq: Every day | ORAL | Status: DC
  Administered 2017-09-09: 80 mg via ORAL
  Filled 2017-09-08: qty 2

## 2017-09-08 MED ORDER — INSULIN ASPART 100 UNIT/ML ~~LOC~~ SOLN
0.0000 [IU] | Freq: Three times a day (TID) | SUBCUTANEOUS | Status: DC
  Administered 2017-09-09: 1 [IU] via SUBCUTANEOUS

## 2017-09-08 MED ORDER — ACETAMINOPHEN 325 MG RE SUPP: 325.0000 mg | RECTAL | Status: DC | PRN

## 2017-09-08 MED ORDER — DOCUSATE SODIUM 100 MG PO CAPS
100.0000 mg | ORAL_CAPSULE | Freq: Every day | ORAL | Status: DC
  Administered 2017-09-09: 100 mg via ORAL
  Filled 2017-09-08: qty 1

## 2017-09-08 MED ORDER — GABAPENTIN 300 MG PO CAPS
300.0000 mg | ORAL_CAPSULE | Freq: Three times a day (TID) | ORAL | Status: DC
  Administered 2017-09-08 – 2017-09-09 (×2): 300 mg via ORAL
  Filled 2017-09-08 (×2): qty 1

## 2017-09-08 MED ORDER — ACETAMINOPHEN 325 MG PO TABS: 325.0000 mg | ORAL_TABLET | ORAL | Status: DC | PRN

## 2017-09-08 MED ORDER — POTASSIUM CHLORIDE CRYS ER 20 MEQ PO TBCR: 20.0000 meq | EXTENDED_RELEASE_TABLET | Freq: Every day | ORAL | Status: DC | PRN

## 2017-09-08 MED ORDER — CEFAZOLIN SODIUM-DEXTROSE 2-4 GM/100ML-% IV SOLN
2.0000 g | Freq: Three times a day (TID) | INTRAVENOUS | Status: AC
  Administered 2017-09-08 – 2017-09-09 (×2): 2 g via INTRAVENOUS
  Filled 2017-09-08 (×2): qty 100

## 2017-09-08 MED ORDER — SODIUM CHLORIDE 0.9 % IV SOLN
INTRAVENOUS | Status: DC | PRN
  Administered 2017-09-08: 500 mL

## 2017-09-08 MED ORDER — SODIUM CHLORIDE 0.9 % IV SOLN
INTRAVENOUS | Status: DC
  Administered 2017-09-08 – 2017-09-09 (×2): via INTRAVENOUS

## 2017-09-08 MED ORDER — MORPHINE SULFATE (PF) 2 MG/ML IV SOLN: 2.0000 mg | INTRAVENOUS | Status: DC | PRN

## 2017-09-08 MED ORDER — FENTANYL CITRATE (PF) 250 MCG/5ML IJ SOLN
INTRAMUSCULAR | Status: DC | PRN
  Administered 2017-09-08 (×3): 50 ug via INTRAVENOUS

## 2017-09-08 MED ORDER — BISACODYL 10 MG RE SUPP: 10.0000 mg | Freq: Every day | RECTAL | Status: DC | PRN

## 2017-09-08 MED ORDER — HYDROCODONE-ACETAMINOPHEN 5-325 MG PO TABS: 1.0000 | ORAL_TABLET | Freq: Four times a day (QID) | ORAL | Status: DC | PRN

## 2017-09-08 MED ORDER — SUGAMMADEX SODIUM 200 MG/2ML IV SOLN
INTRAVENOUS | Status: DC | PRN
  Administered 2017-09-08: 143.4 mg via INTRAVENOUS

## 2017-09-08 MED ORDER — DEXAMETHASONE SODIUM PHOSPHATE 10 MG/ML IJ SOLN
INTRAMUSCULAR | Status: DC | PRN
  Administered 2017-09-08: 5 mg via INTRAVENOUS

## 2017-09-08 MED ORDER — ROCURONIUM BROMIDE 10 MG/ML (PF) SYRINGE
PREFILLED_SYRINGE | INTRAVENOUS | Status: DC | PRN
  Administered 2017-09-08: 10 mg via INTRAVENOUS
  Administered 2017-09-08: 20 mg via INTRAVENOUS
  Administered 2017-09-08 (×3): 30 mg via INTRAVENOUS
  Administered 2017-09-08: 10 mg via INTRAVENOUS
  Administered 2017-09-08: 40 mg via INTRAVENOUS

## 2017-09-08 MED ORDER — LABETALOL HCL 5 MG/ML IV SOLN: 10.0000 mg | INTRAVENOUS | Status: DC | PRN

## 2017-09-08 MED ORDER — CHLORHEXIDINE GLUCONATE 4 % EX LIQD: 60.0000 mL | Freq: Once | CUTANEOUS | Status: DC

## 2017-09-08 MED ORDER — ONDANSETRON HCL 4 MG/2ML IJ SOLN
INTRAMUSCULAR | Status: DC | PRN
  Administered 2017-09-08: 4 mg via INTRAVENOUS

## 2017-09-08 MED ORDER — 0.9 % SODIUM CHLORIDE (POUR BTL) OPTIME
TOPICAL | Status: DC | PRN
  Administered 2017-09-08: 3000 mL

## 2017-09-08 MED ORDER — FENTANYL CITRATE (PF) 250 MCG/5ML IJ SOLN
INTRAMUSCULAR | Status: AC
  Filled 2017-09-08: qty 5

## 2017-09-08 MED ORDER — SUGAMMADEX SODIUM 200 MG/2ML IV SOLN
INTRAVENOUS | Status: AC
  Filled 2017-09-08: qty 2

## 2017-09-08 MED ORDER — IODIXANOL 320 MG/ML IV SOLN
INTRAVENOUS | Status: DC | PRN
  Administered 2017-09-08: 100 mL via INTRAVENOUS

## 2017-09-08 MED ORDER — PROTAMINE SULFATE 10 MG/ML IV SOLN
INTRAVENOUS | Status: AC
  Filled 2017-09-08: qty 5

## 2017-09-08 MED ORDER — SODIUM CHLORIDE 0.9 % IV SOLN: 500.0000 mL | Freq: Once | INTRAVENOUS | Status: DC | PRN

## 2017-09-08 MED ORDER — ONDANSETRON HCL 4 MG/2ML IJ SOLN: 4.0000 mg | Freq: Four times a day (QID) | INTRAMUSCULAR | Status: DC | PRN

## 2017-09-08 MED ORDER — LACTATED RINGERS IV SOLN
INTRAVENOUS | Status: DC | PRN
  Administered 2017-09-08 (×3): via INTRAVENOUS

## 2017-09-08 MED ORDER — ALUM & MAG HYDROXIDE-SIMETH 200-200-20 MG/5ML PO SUSP: 15.0000 mL | ORAL | Status: DC | PRN

## 2017-09-08 MED ORDER — MAGNESIUM SULFATE 2 GM/50ML IV SOLN: 2.0000 g | Freq: Every day | INTRAVENOUS | Status: DC | PRN

## 2017-09-08 MED ORDER — ONDANSETRON HCL 4 MG/2ML IJ SOLN: 4.0000 mg | Freq: Once | INTRAMUSCULAR | Status: DC | PRN

## 2017-09-08 MED ORDER — METOPROLOL SUCCINATE ER 25 MG PO TB24
25.0000 mg | ORAL_TABLET | Freq: Two times a day (BID) | ORAL | Status: DC
  Administered 2017-09-08 – 2017-09-09 (×2): 25 mg via ORAL
  Filled 2017-09-08 (×2): qty 1

## 2017-09-08 MED ORDER — METFORMIN HCL 500 MG PO TABS
1000.0000 mg | ORAL_TABLET | Freq: Two times a day (BID) | ORAL | Status: DC
  Administered 2017-09-08 – 2017-09-09 (×2): 1000 mg via ORAL
  Filled 2017-09-08 (×2): qty 2

## 2017-09-08 MED ORDER — ROSUVASTATIN CALCIUM 10 MG PO TABS
20.0000 mg | ORAL_TABLET | Freq: Every evening | ORAL | Status: DC
  Administered 2017-09-08: 20 mg via ORAL
  Filled 2017-09-08: qty 2

## 2017-09-08 MED ORDER — PROPOFOL 10 MG/ML IV BOLUS
INTRAVENOUS | Status: DC | PRN
  Administered 2017-09-08: 130 mg via INTRAVENOUS

## 2017-09-08 SURGICAL SUPPLY — 60 items
BAG BANDED W/RUBBER/TAPE 36X54 (MISCELLANEOUS) ×3 IMPLANT
CANISTER SUCT 3000ML PPV (MISCELLANEOUS) ×3 IMPLANT
CATH EMB 5FR 80CM (CATHETERS) ×3 IMPLANT
CLIP VESOCCLUDE MED 24/CT (CLIP) ×3 IMPLANT
CLIP VESOCCLUDE SM WIDE 24/CT (CLIP) ×3 IMPLANT
COVER DOME SNAP 22 D (MISCELLANEOUS) ×3 IMPLANT
COVER PROBE W GEL 5X96 (DRAPES) ×3 IMPLANT
DERMABOND ADVANCED (GAUZE/BANDAGES/DRESSINGS) ×2
DERMABOND ADVANCED .7 DNX12 (GAUZE/BANDAGES/DRESSINGS) ×1 IMPLANT
DRAIN CHANNEL 15F RND FF W/TCR (WOUND CARE) IMPLANT
DRAPE X-RAY CASS 24X20 (DRAPES) IMPLANT
DRESSING PREVENA PLUS CUSTOM (GAUZE/BANDAGES/DRESSINGS) ×1 IMPLANT
DRSG PREVENA PLUS CUSTOM (GAUZE/BANDAGES/DRESSINGS) ×3
ELECT REM PT RETURN 9FT ADLT (ELECTROSURGICAL) ×3
ELECTRODE REM PT RTRN 9FT ADLT (ELECTROSURGICAL) ×1 IMPLANT
EVACUATOR SILICONE 100CC (DRAIN) IMPLANT
GAUZE SPONGE 4X4 16PLY XRAY LF (GAUZE/BANDAGES/DRESSINGS) ×3 IMPLANT
GLOVE BIO SURGEON STRL SZ 6.5 (GLOVE) ×10 IMPLANT
GLOVE BIO SURGEONS STRL SZ 6.5 (GLOVE) ×5
GLOVE BIOGEL M 6.5 STRL (GLOVE) ×6 IMPLANT
GLOVE BIOGEL PI IND STRL 7.0 (GLOVE) ×1 IMPLANT
GLOVE BIOGEL PI IND STRL 7.5 (GLOVE) ×1 IMPLANT
GLOVE BIOGEL PI IND STRL 8.5 (GLOVE) ×1 IMPLANT
GLOVE BIOGEL PI INDICATOR 7.0 (GLOVE) ×2
GLOVE BIOGEL PI INDICATOR 7.5 (GLOVE) ×2
GLOVE BIOGEL PI INDICATOR 8.5 (GLOVE) ×2
GLOVE SURG SS PI 7.5 STRL IVOR (GLOVE) ×6 IMPLANT
GOWN STRL NON-REIN LRG LVL3 (GOWN DISPOSABLE) ×3 IMPLANT
GOWN STRL REUS W/ TWL LRG LVL3 (GOWN DISPOSABLE) ×3 IMPLANT
GOWN STRL REUS W/ TWL XL LVL3 (GOWN DISPOSABLE) ×1 IMPLANT
GOWN STRL REUS W/TWL LRG LVL3 (GOWN DISPOSABLE) ×6
GOWN STRL REUS W/TWL XL LVL3 (GOWN DISPOSABLE) ×2
HEMOSTAT SNOW SURGICEL 2X4 (HEMOSTASIS) IMPLANT
KIT BASIN OR (CUSTOM PROCEDURE TRAY) ×3 IMPLANT
KIT ROOM TURNOVER OR (KITS) ×3 IMPLANT
NS IRRIG 1000ML POUR BTL (IV SOLUTION) ×9 IMPLANT
PACK PERIPHERAL VASCULAR (CUSTOM PROCEDURE TRAY) ×3 IMPLANT
PAD ARMBOARD 7.5X6 YLW CONV (MISCELLANEOUS) ×6 IMPLANT
PATCH VASC XENOSURE 1CMX6CM (Vascular Products) ×2 IMPLANT
PATCH VASC XENOSURE 1X6 (Vascular Products) ×1 IMPLANT
SET COLLECT BLD 21X3/4 12 (NEEDLE) IMPLANT
SET COLLECT BLD 21X3/4 12 PB (MISCELLANEOUS) ×3 IMPLANT
SPONGE INTESTINAL PEANUT (DISPOSABLE) ×3 IMPLANT
SPONGE LAP 18X18 X RAY DECT (DISPOSABLE) ×6 IMPLANT
STOPCOCK 4 WAY LG BORE MALE ST (IV SETS) ×6 IMPLANT
SUT ETHILON 3 0 PS 1 (SUTURE) IMPLANT
SUT PROLENE 5 0 C 1 24 (SUTURE) ×21 IMPLANT
SUT PROLENE 6 0 BV (SUTURE) ×30 IMPLANT
SUT VIC AB 2-0 CT1 27 (SUTURE) ×4
SUT VIC AB 2-0 CT1 TAPERPNT 27 (SUTURE) ×2 IMPLANT
SUT VIC AB 3-0 SH 27 (SUTURE) ×2
SUT VIC AB 3-0 SH 27X BRD (SUTURE) ×1 IMPLANT
SUT VICRYL 4-0 PS2 18IN ABS (SUTURE) ×3 IMPLANT
SYR 10ML LL (SYRINGE) ×6 IMPLANT
SYR 5ML LL (SYRINGE) ×3 IMPLANT
SYRINGE 20CC LL (MISCELLANEOUS) ×3 IMPLANT
TOWEL GREEN STERILE (TOWEL DISPOSABLE) ×3 IMPLANT
TUBING EXTENTION W/L.L. (IV SETS) ×3 IMPLANT
UNDERPAD 30X30 (UNDERPADS AND DIAPERS) ×3 IMPLANT
WATER STERILE IRR 1000ML POUR (IV SOLUTION) ×3 IMPLANT

## 2017-09-08 NOTE — Op Note (Signed)
Patient name: Christian Bailey MRN: [id number] DOB: [birth year] Sex: male  09/08/2017 Pre-operative Diagnosis: Right leg claudication Post-operative diagnosis:  Same Surgeon:  [name] Assistants: Helane Gunther Procedure:   #1: Redo right femoral artery exposure   #2: Right common femoral and profunda femoral, and external iliac artery endarterectomy with bovine pericardial patch angioplasty   #3: Revision of right femoral-popliteal bypass graft using the endarterectomized proximal segment of the right superficial femoral artery   #4: Placement of Praveena wound VAC Anesthesia: General Blood Loss: 200 cc Specimens: None  Findings: Extensive calcified scar tissue throughout the groin which was extremely dense.  I was ultimately able to dissect out the common femoral profunda femoral artery and perform endarterectomy from the distal external iliac artery down into the profundofemoral artery up to the first major branch.  The proximal portion of the femoral popliteal bypass graft was nearly occluded from scar tissue.  I evaluated his saphenous vein in the below-knee segment found it to not be adequate.  Therefore I harvested approximately 5 cm of the superficial femoral artery.  This was transected and then I performed an endarterectomy and use this as an interposition between the common femoral artery patch and the proximal section of the bypass.  Indications: The patient's previous undergone right femoral to above-knee popliteal artery bypass graft with vein for claudication.  He has had several episodes of recurrent stenosis in the proximal portion of the bypass graft.  He has developed progressive stenosis within the profundofemoral artery.  We discussed proceeding with surgical repair.  Procedure:  The patient was identified in the holding area and taken to [place] [facility]  The patient was then placed supine on the table. general anesthesia was administered.  The patient was prepped and draped  in the usual sterile fashion.  A time out was called and antibiotics were administered.  The patient's previous longitudinal right femoral incision was opened with a 10 blade.  Cautery was used about subtenons tissue.  The patient had extremely dense calcified scar tissue in his groin which was very tedious to dissect everything out.  Ultimately I was able to expose the distal external iliac artery under the inguinal ligament, the common femoral artery, the profundofemoral artery including its first primary branches as well as the superficial femoral artery and the vein graft.  I removed all the scar tissue sharply.  Once I was satisfied with the exposure, the patient was fully heparinized.  I then occluded the common femoral artery profundofemoral and the vein graft.  A #11 blade was used to make an arteriotomy in the common femoral artery.  It was difficult to find a lumen within the artery.  I ended up opening this down onto the profundofemoral artery up to the major branch.  I performed endarterectomy.  The proximal portion of the vein graft was nearly occluded.  This was the case for about 4 cm.  Next, I inserted a #5 Fogarty into the iliac system so that I could continue the endarterectomy up into the distal external iliac artery.  I was able to get excellent inflow.  All potential embolic debris was removed.  I then selected a bovine pericardial patch and perform patch angioplasty of the common femoral artery down onto the profundofemoral artery for approximately 2 cm.  This was done with a running 5-0 Prolene.  Prior to completion, the appropriate flushing maneuvers were performed and the anastomosis was completed.  Next, I inspected the vein graft.  There  was adequate backbleeding from the vein graft.  The scar tissue was approximately 4 cm in length.  After I removed the scar tissue the vein graft was not long enough to reach the common femoral artery.  I then used ultrasound to evaluate the saphenous vein  in the right leg and I did not feel that it was adequate.  I then turned my attention towards the occluded superficial femoral artery.  I mobilized this for approximately 5 cm.  It was ligated proximally distally.  I then performed a endarterectomy and found an adequate lumen.  I then spatulated this and sewed the anastomosis and a end to end fashion to the vein graft.  I then made a arteriotomy in the patch and open this further with tenotomy scissors.  A end-to-side anastomosis was then created with 6-0 Prolene.  Prior to completion the appropriate flushing maneuvers were performed and the anastomosis was completed.  There were excellent Doppler signals which were graft dependent in the posterior tibial and anterior tibial artery at the ankle.  There is also good graft signal within the profundofemoral in the proximal bypass.  Next, 50 mg of protamine was administered.  Once hemostasis was satisfactory, I reapproximated the subcutaneous tissue with 2-0 Vicryl in multiple layers followed by 3-0 Vicryl and subcutaneous tissue and skin.  A Praveena wound VAC was then placed.  The patient tolerated the procedure well there were no immediate complications.   Disposition: To PACU stable.   Theotis Burrow, M.D. [facility] Office: [phone] Pager:  [phone]

## 2017-09-08 NOTE — H&P (Signed)
   Patient name: Christian Bailey MRN: [id number] DOB: June 16, 1941 Sex: male  REASON FOR VISIT:     pre-op  HISTORY OF PRESENT ILLNESS:   Christian Bailey is a 77 y.o. male w/p right fem AK pop BPG with recurrent stenosis  CURRENT MEDICATIONS:    Current Facility-Administered Medications  Medication Dose Route Frequency Provider Last Rate Last Dose  . 0.9 %  sodium chloride infusion   Intravenous Continuous Serafina Mitchell, MD      . ceFAZolin (ANCEF) IVPB 2g/100 mL premix  2 g Intravenous 30 min Pre-Op Serafina Mitchell, MD      . chlorhexidine (HIBICLENS) 4 % liquid 4 application  60 mL Topical Once Serafina Mitchell, MD       And  . Derrill Memo ON 09/09/2017] chlorhexidine (HIBICLENS) 4 % liquid 4 application  60 mL Topical Once Serafina Mitchell, MD        REVIEW OF SYSTEMS:   [X]  denotes positive finding, [ ]  denotes negative finding Cardiac  Comments:  Chest pain or chest pressure:    Shortness of breath upon exertion:    Short of breath when lying flat:    Irregular heart rhythm:    Constitutional    Fever or chills:      PHYSICAL EXAM:   Vitals:   09/08/17 0650  BP: (!) 106/50  Pulse: 97  Resp: 20  Temp: 98.7 F (37.1 C)  TempSrc: Oral  SpO2: 96%    GENERAL: The patient is a well-nourished male, in no acute distress. The vital signs are documented above. CARDIOVASCULAR: There is a regular rate and rhythm. PULMONARY: Non-labored respirations Non palp pedal right pulse  STUDIES:   See angio   MEDICAL ISSUES:   For revision of proximal bypass  [name], MD [facility] [phone] Pager [phone]

## 2017-09-08 NOTE — Anesthesia Procedure Notes (Signed)
Procedure Name: Intubation Date/Time: 09/08/2017 8:54 AM Performed by: Freddie Breech, CRNA Pre-anesthesia Checklist: Patient identified, Emergency Drugs available, Suction available and Patient being monitored Patient Re-evaluated:Patient Re-evaluated prior to induction Oxygen Delivery Method: Circle System Utilized Preoxygenation: Pre-oxygenation with 100% oxygen Induction Type: IV induction Ventilation: Mask ventilation without difficulty Laryngoscope Size: Mac and 4 Grade View: Grade IV Tube type: Oral Tube size: 7.0 mm Number of attempts: 1 Airway Equipment and Method: Stylet and Oral airway Placement Confirmation: ETT inserted through vocal cords under direct vision,  positive ETCO2 and breath sounds checked- equal and bilateral Secured at: 23 cm Tube secured with: Tape Dental Injury: Teeth and Oropharynx as per pre-operative assessment

## 2017-09-08 NOTE — Transfer of Care (Signed)
Immediate Anesthesia Transfer of Care Note  Patient: Christian Bailey  Procedure(s) Performed: REDO RIGHT FEMORAL ENDARTECTOMY WITH PATCH ANGIOPLASTY. (Right Groin) REDO BYPASS GRAFT FEMORAL-POPLITEAL ARTERY (Right Groin)  Patient Location: PACU  Anesthesia Type:General  Level of Consciousness: drowsy and patient cooperative  Airway & Oxygen Therapy: Patient Spontanous Breathing and Patient connected to face mask oxygen  Post-op Assessment: Report given to RN, Post -op Vital signs reviewed and stable and Patient moving all extremities X 4  Post vital signs: Reviewed and stable  Last Vitals:  Vitals:   09/08/17 0650  BP: (!) 106/50  Pulse: 97  Resp: 20  Temp: 37.1 C  SpO2: 96%    Last Pain:  Vitals:   09/08/17 0723  TempSrc:   PainSc: 2          Complications: No apparent anesthesia complications

## 2017-09-08 NOTE — Progress Notes (Signed)
Pt arrived on unit from PACU lab, vital signs stable, admission assessment complete, pt needs met, bed in lowest position, and call bell within reach. 

## 2017-09-08 NOTE — Progress Notes (Signed)
Patient states he has swelling in left groin that MD is aware of. Left groin hard to palpation and slightly bruised.

## 2017-09-08 NOTE — Anesthesia Postprocedure Evaluation (Signed)
Anesthesia Post Note  Patient: Amier Hoyt  Procedure(s) Performed: REDO RIGHT FEMORAL ENDARTECTOMY WITH PATCH ANGIOPLASTY. (Right Groin) REDO BYPASS GRAFT FEMORAL-POPLITEAL ARTERY (Right Groin)     Patient location during evaluation: PACU Anesthesia Type: General Level of consciousness: awake and alert, patient cooperative and oriented Pain management: pain level controlled Vital Signs Assessment: post-procedure vital signs reviewed and stable Respiratory status: spontaneous breathing, nonlabored ventilation, respiratory function stable and patient connected to nasal cannula oxygen Cardiovascular status: blood pressure returned to baseline and stable Postop Assessment: no apparent nausea or vomiting Anesthetic complications: no    Last Vitals:  Vitals:   09/08/17 1700 09/08/17 1800  BP: 128/61 (!) 133/55  Pulse: (!) 101 (!) 106  Resp: 12 12  Temp: 36.7 C   SpO2: 98% 96%    Last Pain:  Vitals:   09/08/17 1700  TempSrc: Oral  PainSc:                  Izabell Schalk,E. Arisa Congleton

## 2017-09-09 ENCOUNTER — Encounter (HOSPITAL_COMMUNITY): Payer: Self-pay | Admitting: General Practice

## 2017-09-09 ENCOUNTER — Inpatient Hospital Stay (HOSPITAL_COMMUNITY): Payer: Medicare Other

## 2017-09-09 ENCOUNTER — Telehealth: Payer: Self-pay | Admitting: Surgery

## 2017-09-09 ENCOUNTER — Other Ambulatory Visit: Payer: Self-pay

## 2017-09-09 LAB — BASIC METABOLIC PANEL
Anion gap: 10 (ref 5–15)
BUN: 25 mg/dL — AB (ref 6–20)
CO2: 26 mmol/L (ref 22–32)
CREATININE: 1.49 mg/dL — AB (ref 0.61–1.24)
Calcium: 7.9 mg/dL — ABNORMAL LOW (ref 8.9–10.3)
Chloride: 106 mmol/L (ref 101–111)
GFR, EST AFRICAN AMERICAN: 51 mL/min — AB (ref >60)
GFR, EST NON AFRICAN AMERICAN: 44 mL/min — AB (ref >60)
Glucose, Bld: 157 mg/dL — ABNORMAL HIGH (ref 65–99)
Potassium: 4.2 mmol/L (ref 3.5–5.1)
SODIUM: 142 mmol/L (ref 135–145)

## 2017-09-09 LAB — GLUCOSE, CAPILLARY
GLUCOSE-CAPILLARY: 114 mg/dL — AB (ref 65–99)
GLUCOSE-CAPILLARY: 135 mg/dL — AB (ref 65–99)

## 2017-09-09 LAB — CBC
HCT: 27.9 % — ABNORMAL LOW (ref 39.0–52.0)
Hemoglobin: 8.9 g/dL — ABNORMAL LOW (ref 13.0–17.0)
MCH: 30 pg (ref 26.0–34.0)
MCHC: 31.9 g/dL (ref 30.0–36.0)
MCV: 93.9 fL (ref 78.0–100.0)
PLATELETS: 313 10*3/uL (ref 150–400)
RBC: 2.97 MIL/uL — ABNORMAL LOW (ref 4.22–5.81)
RDW: 13.2 % (ref 11.5–15.5)
WBC: 11.4 10*3/uL — ABNORMAL HIGH (ref 4.0–10.5)

## 2017-09-09 MED ORDER — HYDROCODONE-ACETAMINOPHEN 5-325 MG PO TABS: 1.0000 | ORAL_TABLET | ORAL | 0 refills | Status: DC | PRN

## 2017-09-09 NOTE — Telephone Encounter (Signed)
-----   Message from Mena Goes, RN sent at 09/09/2017 10:17 AM EDT ----- Regarding: 2-3 weeks w/ ABIs   ----- Message ----- From: Ulyses Amor, PA-C Sent: 09/09/2017   8:06 AM To: Vvs Charge Pool  F/U with [name] in 2-3 weeks needs ABI's s/p right fem-pop revision.

## 2017-09-09 NOTE — Progress Notes (Signed)
Foley catheter removed, per Aggie Hacker.  No complications noted, awaiting pt to void and ambulate for discharge.

## 2017-09-09 NOTE — Plan of Care (Signed)
  Progressing Education: Ability to describe self-care measures that may prevent or decrease complications (Diabetes Survival Skills Education) will improve 09/09/2017 0116 - Progressing by Blair Promise, RN Coping: Ability to adjust to condition or change in health will improve 09/09/2017 0116 - Progressing by Blair Promise, RN Fluid Volume: Ability to maintain a balanced intake and output will improve 09/09/2017 0116 - Progressing by Blair Promise, RN Health Behavior/Discharge Planning: Ability to identify and utilize available resources and services will improve 09/09/2017 0116 - Progressing by Blair Promise, RN Ability to manage health-related needs will improve 09/09/2017 0116 - Progressing by Blair Promise, RN Metabolic: Ability to maintain appropriate glucose levels will improve 09/09/2017 0116 - Progressing by Blair Promise, RN Nutritional: Maintenance of adequate nutrition will improve 09/09/2017 0116 - Progressing by Blair Promise, RN Progress toward achieving an optimal weight will improve 09/09/2017 0116 - Progressing by Blair Promise, RN Skin Integrity: Risk for impaired skin integrity will decrease 09/09/2017 0116 - Progressing by Blair Promise, RN Tissue Perfusion: Adequacy of tissue perfusion will improve 09/09/2017 0116 - Progressing by Blair Promise, RN

## 2017-09-09 NOTE — Consult Note (Signed)
            [facility] CM Primary Care Navigator  09/09/2017  Kaleth Koy 1941-02-05 [id number]  Went to see patient at the bedside toidentify possible discharge needs. Per patient, he came to the hospital for a revision of right fem-pop bypass. (status post redo right femoral endarterectomy with patch angioplasty)  PatientendorsesDr. Prince Solian with [facility] as hisprimary care provider.   Patientverbalized using[facility] on [employer] and [employer] Mail Order service to obtain medications without difficulty.  Hereportsmanaginghisownmedications at homestraight out of the containers with no problem.  Patient states that wife ([name])has been providingtransportation tohisdoctors'appointments.  Patientverbalized that he lives at home with wife who is the primary caregiver after discharge.  Anticipated plan for discharge ishome per patient.  Patientvoiced understanding to call primarycare provider's office whenhereturnshomefor a post discharge follow-upvisitwithin1- 2 weeksor sooner if needs arise.Patient letter (with PCP's contact number) was provided asareminder.   Discussed with patientregarding [facility] CM services available for health managementat homebuthestates "managing well so far (mainly DM) with close follow-up with primary care provider". Patientis knowledgeable of ways to manage DM and denies any problem for now.  Patient had opted andverbally agreed for EMMI calls to monitor with recovery. Referral made for EMMI General calls to follow-up with recovery after discharge.  Patientexpressed understandingof needto seekreferral from primary care provider to [facility] care management ifdeemed necessary and appropriatefor anyservices in the future.   [facility] care management information was provided for future needs thathemay have.   For additional questions please  contact:  Edwena Felty A. Akita Maxim, BSN, RN-BC [facility] PRIMARY CARE Navigator Cell: [phone]

## 2017-09-09 NOTE — Evaluation (Signed)
Physical Therapy Evaluation & Discharge Patient Details Name: Christian Bailey MRN: [id number] DOB: Feb 03, 1941 Today's Date: 09/09/2017   History of Present Illness  Pt is a 77 y.o. male s/p revision of R femoral-popliteal BPG and endarterectomy with placement of wound vac on 09/09/17. 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    Clinical Impression  Patient evaluated by Physical Therapy with no further acute PT needs identified. PTA, pt indep and lives with wife who will be available for 24/7 support. Pt currently indep with transfers and ambulation. Educ on wound vac purpose and use, as pt very confused about this, stating, "The doctor told me this morning to just throw this away in a few days when the battery runs out" (RN notified to reinforce education prior to d/c). All education has been completed and the patient has no further questions. PT is signing off. Thank you for this referral.    Follow Up Recommendations No PT follow up;Supervision - Intermittent    Equipment Recommendations  None recommended by PT    Recommendations for Other Services       Precautions / Restrictions Precautions Precautions: Fall Restrictions Weight Bearing Restrictions: No      Mobility  Bed Mobility Overal bed mobility: Independent                Transfers Overall transfer level: Independent Equipment used: None                Ambulation/Gait Ambulation/Gait assistance: Independent Ambulation Distance (Feet): 200 Feet Assistive device: None Gait Pattern/deviations: Step-through pattern;Decreased stride length Gait velocity: Decreased      Stairs Stairs: Yes Stairs assistance: Modified independent (Device/Increase time) Stair Management: One rail Left;Forwards Number of Stairs: 5 General stair comments: Simulated ascending steps into home by high knee marching with L rail support. Pt mod indep with this  Wheelchair Mobility    Modified Rankin  (Stroke Patients Only)       Balance Overall balance assessment: No apparent balance deficits (not formally assessed)                                           Pertinent Vitals/Pain Pain Assessment: 0-10 Pain Score: 1  Pain Location: RLE incisions Pain Descriptors / Indicators: Tightness Pain Intervention(s): Monitored during session    Home Living Family/patient expects to be discharged to:: Private residence Living Arrangements: Spouse/significant other Available Help at Discharge: Family;Available 24 hours/day Type of Home: House Home Access: Stairs to enter Entrance Stairs-Rails: Left Entrance Stairs-Number of Steps: 3 Home Layout: One level Home Equipment: Shower seat - built in;Grab bars - tub/shower;Adaptive equipment      Prior Function Level of Independence: Independent               Hand Dominance   Dominant Hand: Right    Extremity/Trunk Assessment   Upper Extremity Assessment Upper Extremity Assessment: RUE deficits/detail RUE Deficits / Details: R shoulder flex/abd limited to <90' (h/o recent R reverse TSA)    Lower Extremity Assessment Lower Extremity Assessment: Overall WFL for tasks assessed       Communication   Communication: No difficulties  Cognition Arousal/Alertness: Awake/alert Behavior During Therapy: WFL for tasks assessed/performed Overall Cognitive Status: Within Functional Limits for tasks assessed  General Comments      Exercises     Assessment/Plan    PT Assessment Patent does not need any further PT services  PT Problem List         PT Treatment Interventions      PT Goals (Current goals can be found in the Care Plan section)  Acute Rehab PT Goals PT Goal Formulation: All assessment and education complete, DC therapy    Frequency     Barriers to discharge        Co-evaluation               AM-PAC PT "6 Clicks" Daily  Activity  Outcome Measure Difficulty turning over in bed (including adjusting bedclothes, sheets and blankets)?: None Difficulty moving from lying on back to sitting on the side of the bed? : None Difficulty sitting down on and standing up from a chair with arms (e.g., wheelchair, bedside commode, etc,.)?: None Help needed moving to and from a bed to chair (including a wheelchair)?: None Help needed walking in hospital room?: None Help needed climbing 3-5 steps with a railing? : A Little 6 Click Score: 23    End of Session Equipment Utilized During Treatment: Gait belt Activity Tolerance: Patient tolerated treatment well Patient left: in chair;with call bell/phone within reach Nurse Communication: Mobility status PT Visit Diagnosis: Other abnormalities of gait and mobility (R26.89)    Time: 6294-7654 PT Time Calculation (min) (ACUTE ONLY): 24 min   Charges:   PT Evaluation $PT Eval Moderate Complexity: 1 Mod PT Treatments $Gait Training: 8-22 mins   PT G Codes:       Mabeline Caras, PT, DPT Acute Rehab Services  Pager: [place] 09/09/2017, 9:48 AM

## 2017-09-09 NOTE — Evaluation (Signed)
Occupational Therapy Evaluation and Discharge Patient Details Name: Christian Bailey MRN: [id number] DOB: Dec 14, 1940 Today's Date: 09/09/2017    History of Present Illness Pt is a 77 y.o. male s/p revision of R femoral-popliteal BPG and endarterectomy with placement of wound vac on 09/09/17. 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   Clinical Impression   PTA Pt mod I for ADL and independent in mobility. Pt is currently at baseline (mod I) for LB dressing, bathing, sink level grooming, transfers) Pt has AE that he uses to maintain independence. Pt with limited ROM in RUE from shoulder replacement in June so he has learned to do many things with LUE and has seen outpatient therapists about his shoulder. Pt with no questions or concerns besides wound vac care - deferred to RN staff (aware) Education complete. OT to sign off at this time. Thank you for the opportunity to serve this patient.     Follow Up Recommendations  No OT follow up;Supervision - Intermittent    Equipment Recommendations  None recommended by OT    Recommendations for Other Services       Precautions / Restrictions Precautions Precautions: Fall Restrictions Weight Bearing Restrictions: No      Mobility Bed Mobility Overal bed mobility: Independent             General bed mobility comments: sitting OOB in the recliner when OT entered  Transfers Overall transfer level: Independent Equipment used: None                  Balance Overall balance assessment: No apparent balance deficits (not formally assessed)                                         ADL either performed or assessed with clinical judgement   ADL Overall ADL's : At baseline                                       General ADL Comments: Able to don/doff socks (has an aide that he uses at home though) ambulate to sink, performed sink level ADL, educated on shower safety (has built in seat  and long handle sponge)     Vision Baseline Vision/History: Wears glasses Wears Glasses: At all times Patient Visual Report: No change from baseline       Perception     Praxis      Pertinent Vitals/Pain Pain Assessment: 0-10 Pain Score: 1  Pain Location: RLE incisions Pain Descriptors / Indicators: Tightness Pain Intervention(s): Monitored during session;Repositioned     Hand Dominance Right(but uses left a lot due to limited ROM in RUE)   Extremity/Trunk Assessment Upper Extremity Assessment Upper Extremity Assessment: RUE deficits/detail RUE Deficits / Details: R shoulder flex/abd limited to <90' (h/o recent R reverse TSA); elbow ROM limited so that the Pt cannot bring his hand to his mouth with a utensil for feeding (seen outpatient therapy)   Lower Extremity Assessment Lower Extremity Assessment: Defer to PT evaluation       Communication Communication Communication: No difficulties   Cognition Arousal/Alertness: Awake/alert Behavior During Therapy: WFL for tasks assessed/performed Overall Cognitive Status: Within Functional Limits for tasks assessed  General Comments  V tach was going off regularly during session - RN notified and she said that MD and RN staff were aware    Exercises     Shoulder Instructions      Home Living Family/patient expects to be discharged to:: Private residence Living Arrangements: Spouse/significant other Available Help at Discharge: Family;Available 24 hours/day Type of Home: House Home Access: Stairs to enter [employer] of Steps: 3 Entrance Stairs-Rails: Left Home Layout: One level     Bathroom Shower/Tub: Occupational psychologist: Standard     Home Equipment: Shower seat - built in;Grab bars - tub/shower;Adaptive equipment Adaptive Equipment: Sock aid;Reacher;Long-handled sponge        Prior Functioning/Environment Level of Independence:  Independent with assistive device(s)        Comments: uses AE and shower seat (built in) for bathing/dressing        OT Problem List:        OT Treatment/Interventions:      OT Goals(Current goals can be found in the care plan section) Acute Rehab OT Goals Patient Stated Goal: to get home OT Goal Formulation: With patient Time For Goal Achievement: 09/23/17 Potential to Achieve Goals: Good  OT Frequency:     Barriers to D/C:            Co-evaluation              AM-PAC PT "6 Clicks" Daily Activity     Outcome Measure Help from another person eating meals?: None Help from another person taking care of personal grooming?: None Help from another person toileting, which includes using toliet, bedpan, or urinal?: None Help from another person bathing (including washing, rinsing, drying)?: A Little Help from another person to put on and taking off regular upper body clothing?: None Help from another person to put on and taking off regular lower body clothing?: A Little 6 Click Score: 22   End of Session Equipment Utilized During Treatment: Gait belt Nurse Communication: Mobility status  Activity Tolerance: Patient tolerated treatment well Patient left: in chair;with call bell/phone within reach                   Time: 6213-0865 OT Time Calculation (min): 20 min Charges:  OT General Charges $OT Visit: 1 Visit OT Evaluation $OT Eval Moderate Complexity: 1 Mod G-Codes:     Hulda Humphrey OTR/L [phone]  [place] 09/09/2017, 10:26 AM

## 2017-09-09 NOTE — Progress Notes (Signed)
[facility]  Subjective  - Doing very well.   Objective (!) 103/55 97 97.9 F (36.6 C) (Oral) (!) 26 96%  Intake/Output Summary (Last 24 hours) at 09/09/2017 0750 Last data filed at 09/09/2017 8341 Gross per 24 hour  Intake 2896 ml  Output 4275 ml  Net -1379 ml    Palpable right  DP with biphasic DP/PT signals Right groin prevena vac in place, groin soft without hematoma Abdomin soft tolerating PO's Heart RRR Lungs non labored breathing  Assessment/Planning: POD # 1  Procedure:   #1: Redo right femoral artery exposure                         #2: Right common femoral and profunda femoral, and external iliac artery endarterectomy with bovine pericardial patch angioplasty                         #3: Revision of right femoral-popliteal bypass graft using the endarterectomized proximal segment of the right superficial femoral artery                         #4: Placement of Praveena wound VAC   Patent by pass with palpable DP pulse.    We will discharge him possible today if he is able to void independently post foley cath removal, ambulate and tolerate breakfast.   F/U in our office in 2-3 weeks with [name] I will order new ABI at this appt.     [name] 09/09/2017 7:50 AM --  Laboratory Lab Results: Recent Labs    09/08/17 1900 09/09/17 0341  WBC 9.9 11.4*  HGB 9.7* 8.9*  HCT 29.9* 27.9*  PLT 292 313   BMET Recent Labs    09/06/17 0923 09/08/17 1900 09/09/17 0341  NA 139  --  142  K 4.5  --  4.2  CL 104  --  106  CO2 26  --  26  GLUCOSE 160*  --  157*  BUN 25*  --  25*  CREATININE 1.42* 1.46* 1.49*  CALCIUM 9.4  --  7.9*    COAG Lab Results  Component Value Date   INR 1.04 09/06/2017   INR 0.99 09/11/2016   INR 1.0 04/10/2016   No results found for: PTT

## 2017-09-09 NOTE — Telephone Encounter (Signed)
Pt had ABI in the hospital on 3/14 rather than making appt here for right fem-pop revision. Scheduled s/p  appt w/Dr.Brabham on 4/8 @ 4:00.

## 2017-09-09 NOTE — Progress Notes (Signed)
Pt discharged following education about post-discharge care and medication administration, IV removed, and pt's questions addressed, vital signs stable.

## 2017-09-09 NOTE — Plan of Care (Signed)
  Progressing Education: Ability to describe self-care measures that may prevent or decrease complications (Diabetes Survival Skills Education) will improve 09/09/2017 0119 - Progressing by Blair Promise, RN 09/09/2017 0116 - Progressing by Blair Promise, RN Coping: Ability to adjust to condition or change in health will improve 09/09/2017 0119 - Progressing by Blair Promise, RN 09/09/2017 0116 - Progressing by Blair Promise, RN Fluid Volume: Ability to maintain a balanced intake and output will improve 09/09/2017 0119 - Progressing by Blair Promise, RN 09/09/2017 0116 - Progressing by Blair Promise, RN Health Behavior/Discharge Planning: Ability to identify and utilize available resources and services will improve 09/09/2017 0119 - Progressing by Blair Promise, RN 09/09/2017 0116 - Progressing by Blair Promise, RN Ability to manage health-related needs will improve 09/09/2017 0119 - Progressing by Blair Promise, RN 09/09/2017 0116 - Progressing by Blair Promise, RN Metabolic: Ability to maintain appropriate glucose levels will improve 09/09/2017 0119 - Progressing by Blair Promise, RN 09/09/2017 0116 - Progressing by Blair Promise, RN Nutritional: Maintenance of adequate nutrition will improve 09/09/2017 0119 - Progressing by Blair Promise, RN 09/09/2017 0116 - Progressing by Blair Promise, RN Progress toward achieving an optimal weight will improve 09/09/2017 0119 - Progressing by Blair Promise, RN 09/09/2017 0116 - Progressing by Blair Promise, RN Skin Integrity: Risk for impaired skin integrity will decrease 09/09/2017 0119 - Progressing by Blair Promise, RN 09/09/2017 0116 - Progressing by Blair Promise, RN Tissue Perfusion: Adequacy of tissue perfusion will improve 09/09/2017 0119 - Progressing by Blair Promise,  RN 09/09/2017 0116 - Progressing by Blair Promise, RN

## 2017-09-09 NOTE — Care Management Note (Signed)
Case Management Note Marvetta Gibbons RN, BSN Unit 4E-Case Manager [phone]  Patient Details  Name: Christian Bailey MRN: [id number] Date of Birth: [birth year]  Subjective/Objective:  Pt admitted s/p redo right fem-iliac endarterectomy with angioplasty, revision of fem-pop bypass with placement of prevena wound vac.                   Action/Plan: PTA pt lived at home with spouse- plan to return home- notified by Tiffany with [facility] pt has pre-op referral for [facility] needs- they will f/u with pt at home for any [facility] needs at time of discharge.   Expected Discharge Date:  09/09/17               Expected Discharge Plan:  [place]  In-House Referral:     Discharge planning Services  CM Consult  Post Acute Care Choice:  Home Health Choice offered to:     DME Arranged:    DME Agency:     HH Arranged:    [place] Agency:     Status of Service:  Completed, signed off  If discussed at [employer] of Stay Meetings, dates discussed:    Discharge Disposition: home/home health   Additional Comments:  09/09/17- [place] Ashlyn Cabler RN, CM- pt for d/c home today- notified Tiffany with [facility] of pt's discharge.   Dawayne Patricia, RN 09/09/2017, 12:30 PM

## 2017-09-10 DIAGNOSIS — Z48812 Encounter for surgical aftercare following surgery on the circulatory system: Secondary | ICD-10-CM | POA: Diagnosis not present

## 2017-09-10 DIAGNOSIS — Z7902 Long term (current) use of antithrombotics/antiplatelets: Secondary | ICD-10-CM | POA: Diagnosis not present

## 2017-09-10 DIAGNOSIS — E1151 Type 2 diabetes mellitus with diabetic peripheral angiopathy without gangrene: Secondary | ICD-10-CM | POA: Diagnosis not present

## 2017-09-10 DIAGNOSIS — Z7984 Long term (current) use of oral hypoglycemic drugs: Secondary | ICD-10-CM | POA: Diagnosis not present

## 2017-09-10 DIAGNOSIS — I2510 Atherosclerotic heart disease of native coronary artery without angina pectoris: Secondary | ICD-10-CM | POA: Diagnosis not present

## 2017-09-10 DIAGNOSIS — I10 Essential (primary) hypertension: Secondary | ICD-10-CM | POA: Diagnosis not present

## 2017-09-10 NOTE — Discharge Summary (Signed)
[facility] Discharge Summary   Patient ID:  Christian Bailey MRN: [id number] DOB/AGE: 03/16/1941 77 y.o.  Admit date: 09/08/2017 Discharge date: 09/09/2017 Date of Surgery: 09/08/2017 Surgeon: Surgeon(s): Serafina Mitchell, MD  Admission Diagnosis: PERIPHERAL ARTERY DISEASE  Discharge Diagnoses:  PERIPHERAL ARTERY DISEASE  Secondary Diagnoses: Past Medical History:  Diagnosis Date  . Anemia   . Complication of anesthesia   . Diabetes mellitus ([place])    TYPE 2  . GERD (gastroesophageal reflux disease)   . GI bleed   . History of blood transfusion    GI bleed  . History of colon polyps   . History of hiatal hernia   . Hyperlipemia   . Hypertension   . Osteoarthritis   . PAD (peripheral artery disease) (HCC)    a. stenting of his left common iliac artery >20 years ago. b. h/o LEIA stent and 2 stents to R SFA in 2011. c. 04/2014:  s/p PTA of right SFA for in-stent restenosis, occluded left SFA  . PONV (postoperative nausea and vomiting)    no porblem with the last 3 surgeries  . RBBB (right bundle branch block with left anterior fascicular block)    NUCLEAR STRESS TEST, 08/18/2010 - no significant wall motion abnoramlities noted, post-stress EF 69%, normal myocardial perfusion study  . Sinus tachycardia    a. Noted during admission 04/2014 but upon review seems to be frequent finding for patient.  . Stenosis of carotid artery    a. 50% right carotid stenosis by angiogram 04/2014.    Procedure(s): REDO RIGHT FEMORAL ENDARTECTOMY WITH PATCH ANGIOPLASTY. REDO BYPASS GRAFT FEMORAL-POPLITEAL ARTERY  Discharged Condition: good  HPI: 77 y/o male presented with new right leg pain.  He has had multiple previous peripheral vascular interventions.  He had a right femoral to popliteal bypass by [name] on March 2018.  Subsequently he had angiograms in August and December 2018 with narrowings at the proximal anastomosis.      ABI's were 0.2 on the right LE in our  office.  This along with his symptoms reveals recurrent stenosis in the proximal bypass.  He was then evaluated with an aortogram  Findings:              Aortogram:  No significant renal artery stenosis was identified. The infrarenal abdominal aorta is widely patent. The left iliac system with its associated stents are widely patent. The right common iliac artery is widely patent.  There is a high-grade lesion approximate 95% at the origin of the right hypogastric artery.  There is a 50 mm gradient lesion in the proximal right external iliac artery.             Right Lower Extremity:  The right common femoral artery is widely patent.  There is a near total occlusion of the origin of the profundofemoral artery.  There is a recurrent Stenosis within the             Left Lower Extremity: Not evaluated  Intervention was Drug-coated balloon angioplasty, right external iliac artery, and Stent, right external iliac artery.  He was then scheduled for redo right fem-popliteal by pass.        Hospital Course:  Christian Bailey is a 77 y.o. male is S/P Procedure(s): REDO RIGHT FEMORAL ENDARTECTOMY WITH PATCH ANGIOPLASTY. REDO BYPASS GRAFT FEMORAL-POPLITEAL ARTERY  Post op course went very well.  He has palpable DP with biphasic DP/PT signals.  A Praveena wound VAC was placed over the right  groin incision for protection secondary to multiple revision surgeries to the area to promote keeping the are clean and dry to aide with healing.  He will be discharged in stable condition.  Keep the wound vac in place until the vac turns off in 7-10 days.  Then shower PRN.      Significant Diagnostic Studies: CBC Lab Results  Component Value Date   WBC 11.4 (H) 09/09/2017   HGB 8.9 (L) 09/09/2017   HCT 27.9 (L) 09/09/2017   MCV 93.9 09/09/2017   PLT 313 09/09/2017    BMET    Component Value Date/Time   NA 142 09/09/2017 0341   K 4.2 09/09/2017 0341   CL 106 09/09/2017 0341   CO2 26 09/09/2017 0341    GLUCOSE 157 (H) 09/09/2017 0341   BUN 25 (H) 09/09/2017 0341   CREATININE 1.49 (H) 09/09/2017 0341   CREATININE 1.33 (H) 04/10/2016 0851   CALCIUM 7.9 (L) 09/09/2017 0341   GFRNONAA 44 (L) 09/09/2017 0341   GFRAA 51 (L) 09/09/2017 0341   COAG Lab Results  Component Value Date   INR 1.04 09/06/2017   INR 0.99 09/11/2016   INR 1.0 04/10/2016     Disposition:  Discharge to :Home Discharge Instructions    Call MD for:  redness, tenderness, or signs of infection (pain, swelling, bleeding, redness, odor or green/yellow discharge around incision site)   Complete by:  As directed    Call MD for:  severe or increased pain, loss or decreased feeling  in affected limb(s)   Complete by:  As directed    Call MD for:  temperature >100.5   Complete by:  As directed    Resume previous diet   Complete by:  As directed      Allergies as of 09/09/2017      Reactions   Asa [aspirin] Other (See Comments)   GI bleeding   Gadolinium Derivatives Other (See Comments)   Pt complained of face flushing/hottness and throat tightness/scratchiness immediately after the injections.  Within 4 minutes, all symptoms were gone and the study was completed.  No further complications or signs of allergy were exhibited after completion of study.    Oxycodone-acetaminophen Other (See Comments)   Dizziness, uncomfortable       Medication List    TAKE these medications   clopidogrel 75 MG tablet Commonly known as:  PLAVIX TAKE 1 TABLET DAILY What changed:    how much to take  how to take this  when to take this   COQ10 PO Take 1 capsule by mouth 2 (two) times daily.   Fish Oil 1200 MG Caps Take 1,200 mg by mouth 2 (two) times daily.   gabapentin 300 MG capsule Commonly known as:  NEURONTIN Take 300 mg by mouth 3 (three) times daily.   glucosamine-chondroitin 500-400 MG tablet Take 1 tablet by mouth 2 (two) times daily.   HYDROcodone-acetaminophen 5-325 MG tablet Commonly known as:   NORCO/VICODIN Take 1 tablet by mouth every 4 (four) hours as needed for moderate pain.   lisinopril 5 MG tablet Commonly known as:  PRINIVIL,ZESTRIL Take 5 mg by mouth 2 (two) times daily.   metFORMIN 1000 MG tablet Commonly known as:  GLUCOPHAGE Take 1 tablet (1,000 mg total) by mouth 2 (two) times daily with a meal.   metoprolol succinate 25 MG 24 hr tablet Commonly known as:  TOPROL-XL Take 25 mg by mouth 2 (two) times daily.   pantoprazole 40 MG tablet Commonly known as:  PROTONIX TAKE 2 TABLETS DAILY What changed:    how much to take  how to take this  when to take this   rosuvastatin 20 MG tablet Commonly known as:  CRESTOR Take 20 mg by mouth every evening.      Verbal and written Discharge instructions given to the patient. Wound care per Discharge AVS Follow-up Information    Serafina Mitchell, MD Follow up in 2 week(s).   Specialties:  Vascular Surgery, Cardiology Why:  office will call Contact information: [place] [address] [phone]        [facility] Follow up.   Specialty:  [place] Why:  pre-op referral for any HH needs- they will f/u with you post discharge Contact information: [place] [address] [phone]           Signed: [name] 09/10/2017, 11:31 AM

## 2017-09-13 ENCOUNTER — Telehealth: Payer: Self-pay | Admitting: *Deleted

## 2017-09-13 DIAGNOSIS — D6489 Other specified anemias: Secondary | ICD-10-CM | POA: Diagnosis not present

## 2017-09-13 NOTE — Telephone Encounter (Signed)
Call from patient who wants leg checked due to "significant swelling"from groin to toes.  States no pain, good color and cap. Refill. Instructed patient to elevate leg above level of his heart.Agreeable to see NP appointment given for 09/14/17.

## 2017-09-14 ENCOUNTER — Ambulatory Visit (INDEPENDENT_AMBULATORY_CARE_PROVIDER_SITE_OTHER): Payer: Medicare Other | Admitting: Family

## 2017-09-14 ENCOUNTER — Other Ambulatory Visit: Payer: Self-pay

## 2017-09-14 ENCOUNTER — Encounter: Payer: Self-pay | Admitting: Family

## 2017-09-14 VITALS — BP 86/31 | HR 95 | Temp 97.4°F | Resp 16 | Ht 65.0 in | Wt 158.0 lb

## 2017-09-14 DIAGNOSIS — I739 Peripheral vascular disease, unspecified: Secondary | ICD-10-CM

## 2017-09-14 DIAGNOSIS — R609 Edema, unspecified: Secondary | ICD-10-CM

## 2017-09-14 NOTE — Progress Notes (Signed)
Postoperative Visit   History of Present Illness  Christian Bailey is a 77 y.o. year old male who is s/p Redo right femoral artery exposure, Right common femoral and profunda femoral, and external iliac artery endarterectomy with bovine pericardial patch angioplasty, Revision of right femoral-popliteal bypass graft using the endarterectomized proximal segment of the right superficial femoral artery, and Placement of Praveena wound VAC on 09-08-17 by [name] for right leg claudication.  Pt had previously undergone right femoral to above-knee popliteal artery bypass graft with vein for claudication on 09-18-16 by [name].  He has had several episodes of recurrent stenosis in the proximal portion of the bypass graft.  He had developed progressive stenosis within the profundofemoral artery.   He returns today wit c/o swelling in his right lower leg.   Discharge Summary note re the wound vac indicates keep the wound vac in place until the vac turns off in 7-10 days.  Then shower PRN. He takes Plavix, no other antiplatelet nor anticoagulant.  He takes a statin.  He stopped smoking in 1990, smoked x 25 years. He has DM, 6.6 A1C from September 2018, scanned from his PCP.  Pt states last A1C was less than 6.6.   Pt states his blood pressure is usually 397-673 systolic, is 84 systolic now. He denies feeling light headed, admits he does not drink enough liquids.   He states he does not recall being advised to elevate his leg, and just started elevating his right foot above his heart yesterday.  He has a little soreness in right groin, alleviated by Tylenol. He denies fever or chills.  Pt states a [place] came to his home, but knew nothing about his wound vac and did not address this.   He has an appointment on 10-04-17 with [name] for 2-3 week post op visit.   The patient is able to complete their activities of daily living.    For VQI Use Only  PRE-ADM LIVING: Home  AMB STATUS:  Ambulatory   Past Medical History:  Diagnosis Date  . Anemia   . Complication of anesthesia   . Diabetes mellitus ([place])    TYPE 2  . GERD (gastroesophageal reflux disease)   . GI bleed   . History of blood transfusion    GI bleed  . History of colon polyps   . History of hiatal hernia   . Hyperlipemia   . Hypertension   . Osteoarthritis   . PAD (peripheral artery disease) (H[facility])    a. stenting of his left common iliac artery >20 years ago. b. h/o LEIA stent and 2 stents to R SFA in 2011. c. 04/2014:  s/p PTA of right SFA for in-stent restenosis, occluded left SFA  . PONV (postoperative nausea and vomiting)    no porblem with the last 3 surgeries  . RBBB (right bundle branch block with left anterior fascicular block)    NUCLEAR STRESS TEST, 08/18/2010 - no significant wall motion abnoramlities noted, post-stress EF 69%, normal myocardial perfusion study  . Sinus tachycardia    a. Noted during admission 04/2014 but upon review seems to be frequent finding for patient.  . Stenosis of carotid artery    a. 50% right carotid stenosis by angiogram 04/2014.    Past Surgical History:  Procedure Laterality Date  . ABDOMINAL AORTOGRAM W/LOWER EXTREMITY N/A 01/27/2017   Procedure: Abdominal Aortogram w/Lower Extremity;  Surgeon: Serafina Mitchell, MD;  Location: [facility];  Service: Cardiovascular;  Laterality: N/A;  . ABDOMINAL AORTOGRAM W/LOWER EXTREMITY N/A 06/08/2017   Procedure: ABDOMINAL AORTOGRAM W/LOWER EXTREMITY;  Surgeon: Serafina Mitchell, MD;  Location: [facility];  Service: Cardiovascular;  Laterality: N/A;  . ABDOMINAL AORTOGRAM W/LOWER EXTREMITY N/A 08/31/2017   Procedure: ABDOMINAL AORTOGRAM W/LOWER EXTREMITY;  Surgeon: Serafina Mitchell, MD;  Location: [place] [facility];  Service: Cardiovascular;  Laterality: N/A;  rt. unilateral  . ANGIOPLASTY / STENTING FEMORAL    . ANGIOPLASTY / STENTING ILIAC    . CEREBRAL ANGIOGRAM N/A 05/14/2014   Procedure: CEREBRAL  ANGIOGRAM;  Surgeon: Lorretta Harp, MD;  [facility]: [facility];  Service: Cardiovascular;  Laterality: N/A;  . COLONOSCOPY W/ POLYPECTOMY    . ENDARTERECTOMY Right 09/08/2017  . ENDARTERECTOMY FEMORAL Right 09/08/2017   Procedure: REDO RIGHT FEMORAL ENDARTECTOMY WITH PATCH ANGIOPLASTY.;  Surgeon: Serafina Mitchell, MD;  Location: [facility];  Service: Vascular;  Laterality: Right;  . EYE SURGERY Bilateral    cataract  . FEMORAL ARTERY STENT Right 05/12/2010   Stented distally with a 6x100 Abbott absolute stent and proximally with a 6x60 Cook Zilver stent resulting in the reduction of the proximal segment 80% and mid segment 60-70% to 0% residual, LEFT common femoral artery stented with a 7x3 Smart stent resulting in reduction of 90% stenosis to 0% residual  . FEMORAL-POPLITEAL BYPASS GRAFT Right 09/18/2016   Procedure: BYPASS GRAFT FEMORAL-POPLITEAL ARTERY;  Surgeon: Serafina Mitchell, MD;  Location: [facility];  Service: Vascular;  Laterality: Right;  . FEMORAL-POPLITEAL BYPASS GRAFT Right 09/08/2017   Procedure: REDO BYPASS GRAFT FEMORAL-POPLITEAL ARTERY;  Surgeon: Serafina Mitchell, MD;  Location: [facility];  Service: Vascular;  Laterality: Right;  . KNEE ARTHROSCOPY     left  . LOWER EXTREMITY ANGIOGRAM N/A 05/14/2014   Procedure: LOWER EXTREMITY ANGIOGRAM;  Surgeon: Lorretta Harp, MD;  Location: [facility] CATH LAB;  Service: Cardiovascular;  Laterality: N/A;  . LOWER EXTREMITY ANGIOGRAPHY N/A 09/21/2016   Procedure: Lower Extremity Angiography;  Surgeon: Conrad Hilltop, MD;  Location: [facility];  Service: Cardiovascular;  Laterality: N/A;  . PERIPHERAL VASCULAR ATHERECTOMY Right 01/27/2017   Procedure: PERIPHERAL VASCULAR ATHERECTOMY;  Surgeon: Serafina Mitchell, MD;  Location: [facility];  Service: Cardiovascular;  Laterality: Right;  . PERIPHERAL VASCULAR BALLOON ANGIOPLASTY Right 06/08/2017   Procedure: PERIPHERAL VASCULAR BALLOON ANGIOPLASTY;  Surgeon: Serafina Mitchell, MD;  Location: [facility];  Service: Cardiovascular;  Laterality: Right;  common femoral and superficial femoral arteries  . PERIPHERAL VASCULAR CATHETERIZATION N/A 04/20/2016   Procedure: Lower Extremity Intervention;  Surgeon: Lorretta Harp, MD;  Location: [facility];  Service: Cardiovascular;  Laterality: N/A;  . PERIPHERAL VASCULAR INTERVENTION  08/31/2017   Procedure: PERIPHERAL VASCULAR INTERVENTION;  Surgeon: Serafina Mitchell, MD;  Location: [facility];  Service: Cardiovascular;;  REIA  . REVERSE SHOULDER ARTHROPLASTY Right 12/07/2016  . REVERSE SHOULDER ARTHROPLASTY Right 12/07/2016   Procedure: REVERSE SHOULDER ARTHROPLASTY;  Surgeon: Netta Cedars, MD;  Location: [facility];  Service: Orthopedics;  Laterality: Right;  . ROTATOR CUFF REPAIR Right 2003  . SFA Right 05/14/2014   PTA  OF RT SFA         DR BERRY    Social History   Socioeconomic History  . Marital status: Married    Spouse name: Not on file  . Number of children: 2  . Years of education: Not on file  . Highest education level: Not on  file  Social Needs  . Financial resource strain: Not on file  . Food insecurity - worry: Not on file  . Food insecurity - inability: Not on file  . Transportation needs - medical: Not on file  . Transportation needs - non-medical: Not on file  Occupational History  . Occupation: retired    Fish farm manager: RETIRED  Tobacco Use  . Smoking status: Former Smoker    Years: 25.00    Last attempt to quit: 06/1988    Years since quitting: 29.2  . Smokeless tobacco: Never Used  Substance and Sexual Activity  . Alcohol use: Yes    Comment: rarely  . Drug use: No  . Sexual activity: Not on file  Other Topics Concern  . Not on file  Social History Narrative  . Not on file    Allergies  Allergen Reactions  . Asa [Aspirin] Other (See Comments)    GI bleeding  . Gadolinium Derivatives Other (See Comments)    Pt complained of face flushing/hottness and throat tightness/scratchiness immediately  after the injections.  Within 4 minutes, all symptoms were gone and the study was completed.  No further complications or signs of allergy were exhibited after completion of study.   . Oxycodone-Acetaminophen Other (See Comments)    Dizziness, uncomfortable     Current Outpatient Medications on File Prior to Visit  Medication Sig Dispense Refill  . clopidogrel (PLAVIX) 75 MG tablet TAKE 1 TABLET DAILY (Patient taking differently: Take 75 mg by mouth daily) 90 tablet 3  . Coenzyme Q10 (COQ10 PO) Take 1 capsule by mouth 2 (two) times daily.     [name] gabapentin (NEURONTIN) 300 MG capsule Take 300 mg by mouth 3 (three) times daily.     [name] glucosamine-chondroitin 500-400 MG tablet Take 1 tablet by mouth 2 (two) times daily.     [name] lisinopril (PRINIVIL,ZESTRIL) 5 MG tablet Take 5 mg by mouth 2 (two) times daily.     . metFORMIN (GLUCOPHAGE) 1000 MG tablet Take 1 tablet (1,000 mg total) by mouth 2 (two) times daily with a meal.    . metoprolol succinate (TOPROL-XL) 25 MG 24 hr tablet Take 25 mg by mouth 2 (two) times daily.     . Omega-3 Fatty Acids (FISH OIL) 1200 MG CAPS Take 1,200 mg by mouth 2 (two) times daily.     . pantoprazole (PROTONIX) 40 MG tablet TAKE 2 TABLETS DAILY (Patient taking differently: Take 40 mg by mouth twice daily) 180 tablet 3  . rosuvastatin (CRESTOR) 20 MG tablet Take 20 mg by mouth every evening.      Current Facility-Administered Medications on File Prior to Visit  Medication Dose Route Frequency Provider Last Rate Last Dose  . 0.9 %  sodium chloride infusion  500 mL Intravenous Once Nelida Meuse III, MD      . 0.9 %  sodium chloride infusion  500 mL Intravenous Once Doran Stabler, MD          Physical Examination  Vitals:   09/14/17 0921  BP: (!) 86/31  Pulse: 95  Resp: 16  Temp: (!) 97.4 F (36.3 C)  TempSrc: Oral  SpO2: 95%  Weight: 158 lb (71.7 kg)  Height: 5\' 5"  (1.651 m)   Body mass index is 26.29 kg/m.   Brisk Doppler signals right DP and  PT.  Edema right lower leg: 2+ non pitting and pitting. Left lower leg: 1+ pitting edema.  Right groin with wound vac dressing in place. Left  femoral pulse is 2+ palpable.   Recheck of blood pressure: 116/62   Medical Decision Making  Christian Bailey is a 77 y.o. year old male who presents s/p  Redo right femoral artery exposure, Right common femoral and profunda femoral, and external iliac artery endarterectomy with bovine pericardial patch angioplasty, Revision of right femoral-popliteal bypass graft using the endarterectomized proximal segment of the right superficial femoral artery, and Placement of Praveena wound VAC on 09-08-17 by [name] for right leg claudication.  I discussed witth Dr. Donnetta Hutching pt HPI, and brisk Doppler pulses right foot, asymptomatic hypotension.   Reperfusion edema of left lower leg combined with keeping legs in dependent position.  To decrease swelling in your foot and leg: Elevate feet above slightly bent knees, feet above heart, overnight and 3-4 times per day for 20 minutes.  Asymptomatic hypotension: I advised him to drink more water.   He has an appointment on 10-04-17 with [name] for 2-3 week post op visit, he will keep this appointment.    Clemon Chambers, RN, MSN, FNP-C [facility] [place] Office: [phone]  09/14/2017, 9:32 AM  Clinic MD: [place]

## 2017-09-15 DIAGNOSIS — Z48812 Encounter for surgical aftercare following surgery on the circulatory system: Secondary | ICD-10-CM | POA: Diagnosis not present

## 2017-09-15 DIAGNOSIS — I2510 Atherosclerotic heart disease of native coronary artery without angina pectoris: Secondary | ICD-10-CM | POA: Diagnosis not present

## 2017-09-15 DIAGNOSIS — I10 Essential (primary) hypertension: Secondary | ICD-10-CM | POA: Diagnosis not present

## 2017-09-15 DIAGNOSIS — Z7902 Long term (current) use of antithrombotics/antiplatelets: Secondary | ICD-10-CM | POA: Diagnosis not present

## 2017-09-15 DIAGNOSIS — E1151 Type 2 diabetes mellitus with diabetic peripheral angiopathy without gangrene: Secondary | ICD-10-CM | POA: Diagnosis not present

## 2017-09-15 DIAGNOSIS — Z7984 Long term (current) use of oral hypoglycemic drugs: Secondary | ICD-10-CM | POA: Diagnosis not present

## 2017-09-17 DIAGNOSIS — I2510 Atherosclerotic heart disease of native coronary artery without angina pectoris: Secondary | ICD-10-CM | POA: Diagnosis not present

## 2017-09-17 DIAGNOSIS — Z7902 Long term (current) use of antithrombotics/antiplatelets: Secondary | ICD-10-CM | POA: Diagnosis not present

## 2017-09-17 DIAGNOSIS — Z7984 Long term (current) use of oral hypoglycemic drugs: Secondary | ICD-10-CM | POA: Diagnosis not present

## 2017-09-17 DIAGNOSIS — E1151 Type 2 diabetes mellitus with diabetic peripheral angiopathy without gangrene: Secondary | ICD-10-CM | POA: Diagnosis not present

## 2017-09-17 DIAGNOSIS — I10 Essential (primary) hypertension: Secondary | ICD-10-CM | POA: Diagnosis not present

## 2017-09-17 DIAGNOSIS — Z48812 Encounter for surgical aftercare following surgery on the circulatory system: Secondary | ICD-10-CM | POA: Diagnosis not present

## 2017-09-20 ENCOUNTER — Encounter (HOSPITAL_COMMUNITY): Payer: Medicare Other

## 2017-09-20 ENCOUNTER — Ambulatory Visit: Payer: Medicare Other | Admitting: Surgery

## 2017-09-20 DIAGNOSIS — Z7902 Long term (current) use of antithrombotics/antiplatelets: Secondary | ICD-10-CM | POA: Diagnosis not present

## 2017-09-20 DIAGNOSIS — Z7984 Long term (current) use of oral hypoglycemic drugs: Secondary | ICD-10-CM | POA: Diagnosis not present

## 2017-09-20 DIAGNOSIS — E1151 Type 2 diabetes mellitus with diabetic peripheral angiopathy without gangrene: Secondary | ICD-10-CM | POA: Diagnosis not present

## 2017-09-20 DIAGNOSIS — Z48812 Encounter for surgical aftercare following surgery on the circulatory system: Secondary | ICD-10-CM | POA: Diagnosis not present

## 2017-09-20 DIAGNOSIS — I10 Essential (primary) hypertension: Secondary | ICD-10-CM | POA: Diagnosis not present

## 2017-09-20 DIAGNOSIS — I2510 Atherosclerotic heart disease of native coronary artery without angina pectoris: Secondary | ICD-10-CM | POA: Diagnosis not present

## 2017-09-21 DIAGNOSIS — M25511 Pain in right shoulder: Secondary | ICD-10-CM | POA: Diagnosis not present

## 2017-09-23 DIAGNOSIS — H353221 Exudative age-related macular degeneration, left eye, with active choroidal neovascularization: Secondary | ICD-10-CM | POA: Diagnosis not present

## 2017-09-24 DIAGNOSIS — E1151 Type 2 diabetes mellitus with diabetic peripheral angiopathy without gangrene: Secondary | ICD-10-CM | POA: Diagnosis not present

## 2017-09-24 DIAGNOSIS — I2510 Atherosclerotic heart disease of native coronary artery without angina pectoris: Secondary | ICD-10-CM | POA: Diagnosis not present

## 2017-09-24 DIAGNOSIS — Z7984 Long term (current) use of oral hypoglycemic drugs: Secondary | ICD-10-CM | POA: Diagnosis not present

## 2017-09-24 DIAGNOSIS — I10 Essential (primary) hypertension: Secondary | ICD-10-CM | POA: Diagnosis not present

## 2017-09-24 DIAGNOSIS — Z48812 Encounter for surgical aftercare following surgery on the circulatory system: Secondary | ICD-10-CM | POA: Diagnosis not present

## 2017-09-24 DIAGNOSIS — Z7902 Long term (current) use of antithrombotics/antiplatelets: Secondary | ICD-10-CM | POA: Diagnosis not present

## 2017-09-27 DIAGNOSIS — Z7984 Long term (current) use of oral hypoglycemic drugs: Secondary | ICD-10-CM | POA: Diagnosis not present

## 2017-09-27 DIAGNOSIS — E1151 Type 2 diabetes mellitus with diabetic peripheral angiopathy without gangrene: Secondary | ICD-10-CM | POA: Diagnosis not present

## 2017-09-27 DIAGNOSIS — Z48812 Encounter for surgical aftercare following surgery on the circulatory system: Secondary | ICD-10-CM | POA: Diagnosis not present

## 2017-09-27 DIAGNOSIS — I2510 Atherosclerotic heart disease of native coronary artery without angina pectoris: Secondary | ICD-10-CM | POA: Diagnosis not present

## 2017-09-27 DIAGNOSIS — I10 Essential (primary) hypertension: Secondary | ICD-10-CM | POA: Diagnosis not present

## 2017-09-27 DIAGNOSIS — Z7902 Long term (current) use of antithrombotics/antiplatelets: Secondary | ICD-10-CM | POA: Diagnosis not present

## 2017-10-01 DIAGNOSIS — I2510 Atherosclerotic heart disease of native coronary artery without angina pectoris: Secondary | ICD-10-CM | POA: Diagnosis not present

## 2017-10-01 DIAGNOSIS — I10 Essential (primary) hypertension: Secondary | ICD-10-CM | POA: Diagnosis not present

## 2017-10-01 DIAGNOSIS — Z48812 Encounter for surgical aftercare following surgery on the circulatory system: Secondary | ICD-10-CM | POA: Diagnosis not present

## 2017-10-01 DIAGNOSIS — Z7984 Long term (current) use of oral hypoglycemic drugs: Secondary | ICD-10-CM | POA: Diagnosis not present

## 2017-10-01 DIAGNOSIS — E1151 Type 2 diabetes mellitus with diabetic peripheral angiopathy without gangrene: Secondary | ICD-10-CM | POA: Diagnosis not present

## 2017-10-01 DIAGNOSIS — Z7902 Long term (current) use of antithrombotics/antiplatelets: Secondary | ICD-10-CM | POA: Diagnosis not present

## 2017-10-04 ENCOUNTER — Ambulatory Visit (INDEPENDENT_AMBULATORY_CARE_PROVIDER_SITE_OTHER): Payer: Medicare Other | Admitting: Surgery

## 2017-10-04 ENCOUNTER — Other Ambulatory Visit: Payer: Self-pay

## 2017-10-04 ENCOUNTER — Encounter: Payer: Self-pay | Admitting: Surgery

## 2017-10-04 VITALS — BP 108/58 | HR 99 | Temp 98.2°F | Resp 20 | Ht 65.0 in | Wt 155.7 lb

## 2017-10-04 DIAGNOSIS — I739 Peripheral vascular disease, unspecified: Secondary | ICD-10-CM

## 2017-10-04 DIAGNOSIS — D62 Acute posthemorrhagic anemia: Secondary | ICD-10-CM | POA: Diagnosis not present

## 2017-10-04 NOTE — Progress Notes (Signed)
Patient name: Christian Bailey MRN: [id number] DOB: May 01, 1941 Sex: male  REASON FOR VISIT:     post op  HISTORY OF PRESENT ILLNESS:    Christian Bailey a 77 y.o.malewho is status post right femoral-popliteal bypass graft with vein on 09/18/2016. This was done for claudication. Postoperatively, the patient's ABIs were below what I thought they should be and therefore he underwent angiography which revealed a widely patent bypass graft and no evidence for the reason why the ABIs were low. He returned home and his walking improve  He recently felt that his right leg was feeling the way did before surgery. Therefore an 01/27/2017 he was taken for angiography. Approximately 60% stenosis of the common femoral and proximal right femoral-popliteal bypass graft was identified. These were successfully treated using Orbitalatherectomy and a 5 mm drug coated balloon.  His symptoms recurred.  And therefore, on 09/08/2017 he underwent revision of his bypass graft.  This was because of a high-grade stenosis in the proximal portion.  Intraoperatively he had very dense scar tissue.  I had to resect the diseased vein.  I looked for other pieces of vein to serve as an interposition graft but he did not have suitable conduit.  I therefore elected to resect the proximal portion of the superficial femoral artery and endarterectomized this and use this as an interposition graft between the bovine pericardial patch and the distal vein graft.  He is back today for follow-up.  The limping is gone but he still has difficulty walking long distances.  CURRENT MEDICATIONS:    Current Outpatient Medications  Medication Sig Dispense Refill  . clopidogrel (PLAVIX) 75 MG tablet TAKE 1 TABLET DAILY (Patient taking differently: Take 75 mg by mouth daily) 90 tablet 3  . Coenzyme Q10 (COQ10 PO) Take 1 capsule by mouth 2 (two) times daily.     [name] gabapentin (NEURONTIN) 300 MG capsule Take 300  mg by mouth 3 (three) times daily.     [name] glucosamine-chondroitin 500-400 MG tablet Take 1 tablet by mouth 2 (two) times daily.     [name] lisinopril (PRINIVIL,ZESTRIL) 5 MG tablet Take 5 mg by mouth 2 (two) times daily.     . metFORMIN (GLUCOPHAGE) 1000 MG tablet Take 1 tablet (1,000 mg total) by mouth 2 (two) times daily with a meal.    . metoprolol succinate (TOPROL-XL) 25 MG 24 hr tablet Take 25 mg by mouth 2 (two) times daily.     . Omega-3 Fatty Acids (FISH OIL) 1200 MG CAPS Take 1,200 mg by mouth 2 (two) times daily.     . pantoprazole (PROTONIX) 40 MG tablet TAKE 2 TABLETS DAILY (Patient taking differently: Take 40 mg by mouth twice daily) 180 tablet 3  . rosuvastatin (CRESTOR) 20 MG tablet Take 20 mg by mouth every evening.      Current Facility-Administered Medications  Medication Dose Route Frequency Provider Last Rate Last Dose  . 0.9 %  sodium chloride infusion  500 mL Intravenous Once Nelida Meuse III, MD      . 0.9 %  sodium chloride infusion  500 mL Intravenous Once Nelida Meuse III, MD        REVIEW OF SYSTEMS:   [X]  denotes positive finding, [ ]  denotes negative finding Cardiac  Comments:  Chest pain or chest pressure:    Shortness of breath upon exertion:    Short of breath when lying flat:    Irregular heart rhythm:    Constitutional    Fever or  chills:      PHYSICAL EXAM:   Vitals:   10/04/17 1527  BP: (!) 108/58  Pulse: 99  Resp: 20  Temp: 98.2 F (36.8 C)  TempSrc: Oral  SpO2: 97%  Weight: 155 lb 11.2 oz (70.6 kg)  Height: 5\' 5"  (1.651 m)    GENERAL: The patient is a well-nourished male, in no acute distress. The vital signs are documented above. CARDIOVASCULAR: There is a regular rate and rhythm. PULMONARY: Non-labored respirations Brisk posterior tibial dorsalis pedis Doppler signals bilaterally.  Suture material was removed from the right groin incision which is healing nicely.  1+ edema to the right leg  STUDIES:   None   MEDICAL ISSUES:    Patient doing very well following his bypass.  Unfortunately he still having issues with claudication despite a widely patent bypass graft and excellent Doppler signals.  I have had him evaluated by neurosurgery to see if this is a lower back issue.  It was not felt that this was related to his lower back.  I am unclear at this time why he is having such difficulty with ambulation.  I have postulated that this may be venous claudication.  I am going to try him in some compression stockings to see if this helps.  He will come back and see me in 3 months for follow-up.  [name], MD [facility] [phone] Pager [phone]

## 2017-10-05 DIAGNOSIS — D62 Acute posthemorrhagic anemia: Secondary | ICD-10-CM | POA: Diagnosis not present

## 2017-10-08 DIAGNOSIS — Z6825 Body mass index (BMI) 25.0-25.9, adult: Secondary | ICD-10-CM | POA: Diagnosis not present

## 2017-10-08 DIAGNOSIS — I7389 Other specified peripheral vascular diseases: Secondary | ICD-10-CM | POA: Diagnosis not present

## 2017-10-08 DIAGNOSIS — D62 Acute posthemorrhagic anemia: Secondary | ICD-10-CM | POA: Diagnosis not present

## 2017-10-08 DIAGNOSIS — D6489 Other specified anemias: Secondary | ICD-10-CM | POA: Diagnosis not present

## 2017-10-08 DIAGNOSIS — I10 Essential (primary) hypertension: Secondary | ICD-10-CM | POA: Diagnosis not present

## 2017-10-19 DIAGNOSIS — M25511 Pain in right shoulder: Secondary | ICD-10-CM | POA: Diagnosis not present

## 2017-10-21 DIAGNOSIS — I739 Peripheral vascular disease, unspecified: Secondary | ICD-10-CM | POA: Diagnosis not present

## 2017-10-21 DIAGNOSIS — L602 Onychogryphosis: Secondary | ICD-10-CM | POA: Diagnosis not present

## 2017-10-21 DIAGNOSIS — E1351 Other specified diabetes mellitus with diabetic peripheral angiopathy without gangrene: Secondary | ICD-10-CM | POA: Diagnosis not present

## 2017-10-22 DIAGNOSIS — M25511 Pain in right shoulder: Secondary | ICD-10-CM | POA: Diagnosis not present

## 2017-10-25 DIAGNOSIS — M25511 Pain in right shoulder: Secondary | ICD-10-CM | POA: Diagnosis not present

## 2017-10-28 DIAGNOSIS — M25511 Pain in right shoulder: Secondary | ICD-10-CM | POA: Diagnosis not present

## 2017-11-01 DIAGNOSIS — M25511 Pain in right shoulder: Secondary | ICD-10-CM | POA: Diagnosis not present

## 2017-11-03 DIAGNOSIS — H353124 Nonexudative age-related macular degeneration, left eye, advanced atrophic with subfoveal involvement: Secondary | ICD-10-CM | POA: Diagnosis not present

## 2017-11-03 DIAGNOSIS — H353212 Exudative age-related macular degeneration, right eye, with inactive choroidal neovascularization: Secondary | ICD-10-CM | POA: Diagnosis not present

## 2017-11-03 DIAGNOSIS — H353221 Exudative age-related macular degeneration, left eye, with active choroidal neovascularization: Secondary | ICD-10-CM | POA: Diagnosis not present

## 2017-11-03 DIAGNOSIS — H353113 Nonexudative age-related macular degeneration, right eye, advanced atrophic without subfoveal involvement: Secondary | ICD-10-CM | POA: Diagnosis not present

## 2017-11-04 DIAGNOSIS — M25511 Pain in right shoulder: Secondary | ICD-10-CM | POA: Diagnosis not present

## 2017-11-08 DIAGNOSIS — M25511 Pain in right shoulder: Secondary | ICD-10-CM | POA: Diagnosis not present

## 2017-11-10 DIAGNOSIS — M25511 Pain in right shoulder: Secondary | ICD-10-CM | POA: Diagnosis not present

## 2017-11-15 DIAGNOSIS — M25511 Pain in right shoulder: Secondary | ICD-10-CM | POA: Diagnosis not present

## 2017-11-26 DIAGNOSIS — M25511 Pain in right shoulder: Secondary | ICD-10-CM | POA: Diagnosis not present

## 2017-11-29 DIAGNOSIS — E1151 Type 2 diabetes mellitus with diabetic peripheral angiopathy without gangrene: Secondary | ICD-10-CM | POA: Diagnosis not present

## 2017-11-29 DIAGNOSIS — M25511 Pain in right shoulder: Secondary | ICD-10-CM | POA: Diagnosis not present

## 2017-11-29 DIAGNOSIS — D62 Acute posthemorrhagic anemia: Secondary | ICD-10-CM | POA: Diagnosis not present

## 2017-11-29 DIAGNOSIS — Z6824 Body mass index (BMI) 24.0-24.9, adult: Secondary | ICD-10-CM | POA: Diagnosis not present

## 2017-11-29 DIAGNOSIS — I2510 Atherosclerotic heart disease of native coronary artery without angina pectoris: Secondary | ICD-10-CM | POA: Diagnosis not present

## 2017-11-29 DIAGNOSIS — I7389 Other specified peripheral vascular diseases: Secondary | ICD-10-CM | POA: Diagnosis not present

## 2017-11-29 DIAGNOSIS — I10 Essential (primary) hypertension: Secondary | ICD-10-CM | POA: Diagnosis not present

## 2017-11-30 DIAGNOSIS — M25511 Pain in right shoulder: Secondary | ICD-10-CM | POA: Diagnosis not present

## 2017-12-07 DIAGNOSIS — M25511 Pain in right shoulder: Secondary | ICD-10-CM | POA: Diagnosis not present

## 2017-12-22 DIAGNOSIS — Z96611 Presence of right artificial shoulder joint: Secondary | ICD-10-CM | POA: Diagnosis not present

## 2017-12-23 DIAGNOSIS — Z6824 Body mass index (BMI) 24.0-24.9, adult: Secondary | ICD-10-CM | POA: Diagnosis not present

## 2017-12-23 DIAGNOSIS — I10 Essential (primary) hypertension: Secondary | ICD-10-CM | POA: Diagnosis not present

## 2017-12-28 ENCOUNTER — Other Ambulatory Visit: Payer: Self-pay

## 2017-12-28 DIAGNOSIS — H353212 Exudative age-related macular degeneration, right eye, with inactive choroidal neovascularization: Secondary | ICD-10-CM | POA: Diagnosis not present

## 2017-12-28 DIAGNOSIS — H353113 Nonexudative age-related macular degeneration, right eye, advanced atrophic without subfoveal involvement: Secondary | ICD-10-CM | POA: Diagnosis not present

## 2017-12-28 DIAGNOSIS — H353124 Nonexudative age-related macular degeneration, left eye, advanced atrophic with subfoveal involvement: Secondary | ICD-10-CM | POA: Diagnosis not present

## 2017-12-28 DIAGNOSIS — H353221 Exudative age-related macular degeneration, left eye, with active choroidal neovascularization: Secondary | ICD-10-CM | POA: Diagnosis not present

## 2017-12-28 DIAGNOSIS — I739 Peripheral vascular disease, unspecified: Secondary | ICD-10-CM

## 2018-01-03 ENCOUNTER — Ambulatory Visit (HOSPITAL_COMMUNITY)
Admission: RE | Admit: 2018-01-03 | Discharge: 2018-01-03 | Disposition: A | Discharge status: Home / Self Care | Payer: Medicare Other | Source: Ambulatory Visit | Attending: Surgery | Admitting: Surgery

## 2018-01-03 ENCOUNTER — Other Ambulatory Visit: Payer: Self-pay

## 2018-01-03 ENCOUNTER — Encounter: Payer: Self-pay | Admitting: Surgery

## 2018-01-03 ENCOUNTER — Ambulatory Visit (INDEPENDENT_AMBULATORY_CARE_PROVIDER_SITE_OTHER): Payer: Medicare Other | Admitting: Surgery

## 2018-01-03 ENCOUNTER — Ambulatory Visit (INDEPENDENT_AMBULATORY_CARE_PROVIDER_SITE_OTHER)
Admission: RE | Admit: 2018-01-03 | Discharge: 2018-01-03 | Disposition: A | Discharge status: Home / Self Care | Payer: Medicare Other | Source: Ambulatory Visit | Attending: Surgery | Admitting: Surgery

## 2018-01-03 VITALS — BP 107/56 | HR 93 | Resp 20 | Ht 65.0 in | Wt 148.5 lb

## 2018-01-03 DIAGNOSIS — R9389 Abnormal findings on diagnostic imaging of other specified body structures: Secondary | ICD-10-CM | POA: Insufficient documentation

## 2018-01-03 DIAGNOSIS — I70202 Unspecified atherosclerosis of native arteries of extremities, left leg: Secondary | ICD-10-CM | POA: Insufficient documentation

## 2018-01-03 DIAGNOSIS — Z87891 Personal history of nicotine dependence: Secondary | ICD-10-CM | POA: Insufficient documentation

## 2018-01-03 DIAGNOSIS — I739 Peripheral vascular disease, unspecified: Secondary | ICD-10-CM | POA: Diagnosis not present

## 2018-01-03 DIAGNOSIS — I10 Essential (primary) hypertension: Secondary | ICD-10-CM | POA: Diagnosis not present

## 2018-01-03 DIAGNOSIS — R0989 Other specified symptoms and signs involving the circulatory and respiratory systems: Secondary | ICD-10-CM | POA: Diagnosis present

## 2018-01-03 DIAGNOSIS — E785 Hyperlipidemia, unspecified: Secondary | ICD-10-CM | POA: Diagnosis not present

## 2018-01-03 DIAGNOSIS — E1151 Type 2 diabetes mellitus with diabetic peripheral angiopathy without gangrene: Secondary | ICD-10-CM | POA: Insufficient documentation

## 2018-01-03 NOTE — Progress Notes (Signed)
[facility]in [facility]ist of [place]  Patient name: Christian Bailey MRN: [id number] DOB: [birth year] Sex: male   REASON FOR VISIT:    Follow up  HISOTRY OF PRESENT ILLNESS:    Christian Bailey a 77 y.o.malewho is status post right femoral-popliteal bypass graft with vein on 09/18/2016. This was done for claudication. Postoperatively, the patient's ABIs were below what I thought they should be and therefore he underwent angiography which revealed a widely patent bypass graft and no evidence for the reason why the ABIs were low. He returned home and his walking improve  He recently felt that his right leg was feeling the way did before surgery. Therefore an 01/27/2017 he was taken for angiography. Approximately 60% stenosis of the common femoral and proximal right femoral-popliteal bypass graft was identified. These were successfully treated using Orbitalatherectomy and a 5 mm drug coated balloon.  His symptoms recurred.  And therefore, on 09/08/2017 he underwent revision of his bypass graft.  This was because of a high-grade stenosis in the proximal portion.  Intraoperatively he had very dense scar tissue.  I had to resect the diseased vein.  I looked for other pieces of vein to serve as an interposition graft but he did not have suitable conduit.  I therefore elected to resect the proximal portion of the superficial femoral artery and endarterectomized this and use this as an interposition graft between the bovine pericardial patch and the distal vein graft.  He is back today for follow-up.    His symptoms are on changed and he is having trouble getting to the mailbox because of pain in his legs.  He does not have any open wounds.   PAST MEDICAL HISTORY:   Past Medical History:  Diagnosis Date  . Anemia   . Complication of anesthesia   . Diabetes mellitus ([place])    TYPE 2  . GERD (gastroesophageal reflux disease)   . GI bleed   . History of  blood transfusion    GI bleed  . History of colon polyps   . History of hiatal hernia   . Hyperlipemia   . Hypertension   . Osteoarthritis   . PAD (peripheral artery disease) (HCC)    a. stenting of his left common iliac artery >20 years ago. b. h/o LEIA stent and 2 stents to R SFA in 2011. c. 04/2014:  s/p PTA of right SFA for in-stent restenosis, occluded left SFA  . PONV (postoperative nausea and vomiting)    no porblem with the last 3 surgeries  . RBBB (right bundle branch block with left anterior fascicular block)    NUCLEAR STRESS TEST, 08/18/2010 - no significant wall motion abnoramlities noted, post-stress EF 69%, normal myocardial perfusion study  . Sinus tachycardia    a. Noted during admission 04/2014 but upon review seems to be frequent finding for patient.  . Stenosis of carotid artery    a. 50% right carotid stenosis by angiogram 04/2014.     FAMILY HISTORY:   Family History  Problem Relation Age of Onset  . Heart disease Mother   . Leukemia Mother   . Stomach cancer Father   . Esophageal cancer Brother   . Liver disease Brother   . Alcoholism Brother   . Leukemia Brother   . Leukemia Brother   . Diabetes type II Brother   . Lung disease Sister     SOCIAL HISTORY:   Social History   Tobacco Use  . Smoking status: Former Smoker  Years: 25.00    Last attempt to quit: 06/1988    Years since quitting: 29.5  . Smokeless tobacco: Never Used  Substance Use Topics  . Alcohol use: Yes    Comment: rarely     ALLERGIES:   Allergies  Allergen Reactions  . Asa [Aspirin] Other (See Comments)    GI bleeding  . Gadolinium Derivatives Other (See Comments)    Pt complained of face flushing/hottness and throat tightness/scratchiness immediately after the injections.  Within 4 minutes, all symptoms were gone and the study was completed.  No further complications or signs of allergy were exhibited after completion of study.   . Oxycodone-Acetaminophen Other (See  Comments)    Dizziness, uncomfortable      CURRENT MEDICATIONS:   Current Outpatient Medications  Medication Sig Dispense Refill  . clopidogrel (PLAVIX) 75 MG tablet TAKE 1 TABLET DAILY (Patient taking differently: Take 75 mg by mouth daily) 90 tablet 3  . Coenzyme Q10 (COQ10 PO) Take 1 capsule by mouth 2 (two) times daily.     [name] glucosamine-chondroitin 500-400 MG tablet Take 1 tablet by mouth 2 (two) times daily.     [name] lisinopril (PRINIVIL,ZESTRIL) 5 MG tablet Take 5 mg by mouth 2 (two) times daily.     . metFORMIN (GLUCOPHAGE) 1000 MG tablet Take 1 tablet (1,000 mg total) by mouth 2 (two) times daily with a meal.    . metoprolol succinate (TOPROL-XL) 25 MG 24 hr tablet Take 25 mg by mouth 2 (two) times daily.     . Omega-3 Fatty Acids (FISH OIL) 1200 MG CAPS Take 1,200 mg by mouth 2 (two) times daily.     . pantoprazole (PROTONIX) 40 MG tablet TAKE 2 TABLETS DAILY (Patient taking differently: Take 40 mg by mouth twice daily) 180 tablet 3  . rosuvastatin (CRESTOR) 20 MG tablet Take 20 mg by mouth every evening.     . gabapentin (NEURONTIN) 300 MG capsule Take 300 mg by mouth 3 (three) times daily.      Current Facility-Administered Medications  Medication Dose Route Frequency Provider Last Rate Last Dose  . 0.9 %  sodium chloride infusion  500 mL Intravenous Once Nelida Meuse III, MD      . 0.9 %  sodium chloride infusion  500 mL Intravenous Once Danis, Estill Cotta III, MD        REVIEW OF SYSTEMS:   [X]  denotes positive finding, [ ]  denotes negative finding Cardiac  Comments:  Chest pain or chest pressure:    Shortness of breath upon exertion:    Short of breath when lying flat:    Irregular heart rhythm:        Vascular    Pain in calf, thigh, or hip brought on by ambulation: x   Pain in feet at night that wakes you up from your sleep:     Blood clot in your veins:    Leg swelling:         Pulmonary    Oxygen at home:    Productive cough:     Wheezing:           Neurologic    Sudden weakness in arms or legs:     Sudden numbness in arms or legs:     Sudden onset of difficulty speaking or slurred speech:    Temporary loss of vision in one eye:     Problems with dizziness:         Gastrointestinal    Blood  in stool:     Vomited blood:         Genitourinary    Burning when urinating:     Blood in urine:        Psychiatric    Major depression:         Hematologic    Bleeding problems:    Problems with blood clotting too easily:        Skin    Rashes or ulcers:        Constitutional    Fever or chills:      PHYSICAL EXAM:   Vitals:   01/03/18 1553  BP: (!) 107/56  Pulse: 93  Resp: 20  SpO2: 96%  Weight: 148 lb 8 oz (67.4 kg)  Height: 5\' 5"  (1.651 m)    GENERAL: The patient is a well-nourished male, in no acute distress. The vital signs are documented above. CARDIAC: There is a regular rate and rhythm.  VASCULAR: Nonpalpable pedal pulse PULMONARY: Non-labored respirations MUSCULOSKELETAL: There are no major deformities or cyanosis. NEUROLOGIC: No focal weakness or paresthesias are detected. SKIN: There are no ulcers or rashes noted. PSYCHIATRIC: The patient has a normal affect.  STUDIES:   I have ordered and reviewed his vascular lab studies with the following results: Right ABI= 0.41 (0.20 The left ABI= 0.66 (0 0.62)  Duplex shows the right bypass graft is widely patent without evidence of stenosis.  The patient has monophasic waveforms throughout  MEDICAL ISSUES:   Atherosclerotic vascular disease: The patient continues to have persistent symptoms despite femoral-popliteal bypass grafting which has had to be revised and intervened on several times.  I have sent him for lower back evaluation given his persistent symptoms.  I am still puzzled as to why he has such significant immobility.  We reviewed his most recent arteriogram today.  My suspicion is that because of his hypoplastic appearing iliac vessels that this is  his rate limiting step causing significant reduction in blood flow.  We discussed the possibility that he may require aortoiliac intervention either surgical or percutaneous.  I told him I was going to discuss this with several of my partners and call him to discuss our conclusions.  He gave me his phone number of [phone].    [name], MD [facility] Specialists of [facility] [phone] Pager [phone]

## 2018-01-06 ENCOUNTER — Other Ambulatory Visit: Payer: Self-pay

## 2018-01-06 DIAGNOSIS — I70211 Atherosclerosis of native arteries of extremities with intermittent claudication, right leg: Secondary | ICD-10-CM

## 2018-01-06 DIAGNOSIS — L602 Onychogryphosis: Secondary | ICD-10-CM | POA: Diagnosis not present

## 2018-01-06 DIAGNOSIS — E1351 Other specified diabetes mellitus with diabetic peripheral angiopathy without gangrene: Secondary | ICD-10-CM | POA: Diagnosis not present

## 2018-01-06 DIAGNOSIS — I739 Peripheral vascular disease, unspecified: Secondary | ICD-10-CM

## 2018-01-06 DIAGNOSIS — M79672 Pain in left foot: Secondary | ICD-10-CM | POA: Diagnosis not present

## 2018-01-06 DIAGNOSIS — M79671 Pain in right foot: Secondary | ICD-10-CM | POA: Diagnosis not present

## 2018-01-24 ENCOUNTER — Other Ambulatory Visit: Payer: Self-pay

## 2018-01-24 DIAGNOSIS — Z01818 Encounter for other preprocedural examination: Secondary | ICD-10-CM

## 2018-01-24 DIAGNOSIS — I739 Peripheral vascular disease, unspecified: Secondary | ICD-10-CM

## 2018-02-03 ENCOUNTER — Ambulatory Visit
Admission: RE | Admit: 2018-02-03 | Discharge: 2018-02-03 | Disposition: A | Discharge status: Home / Self Care | Payer: Medicare Other | Source: Ambulatory Visit | Attending: Surgery | Admitting: Surgery

## 2018-02-03 ENCOUNTER — Encounter: Payer: Self-pay | Admitting: Radiology

## 2018-02-03 DIAGNOSIS — Z01818 Encounter for other preprocedural examination: Secondary | ICD-10-CM

## 2018-02-03 DIAGNOSIS — I739 Peripheral vascular disease, unspecified: Secondary | ICD-10-CM

## 2018-02-03 DIAGNOSIS — K429 Umbilical hernia without obstruction or gangrene: Secondary | ICD-10-CM | POA: Diagnosis not present

## 2018-02-03 MED ORDER — IOPAMIDOL (ISOVUE-370) INJECTION 76%
100.0000 mL | Freq: Once | INTRAVENOUS | Status: AC | PRN
  Administered 2018-02-03: 100 mL via INTRAVENOUS

## 2018-02-10 ENCOUNTER — Telehealth: Payer: Self-pay | Admitting: *Deleted

## 2018-02-10 NOTE — Telephone Encounter (Signed)
Patient called to inform this office that he will be out of the country and unreachable by phone till the end of September. Instructed patient to call this office when he returns to arrange call with [name] for CT results.

## 2018-03-01 DIAGNOSIS — H353113 Nonexudative age-related macular degeneration, right eye, advanced atrophic without subfoveal involvement: Secondary | ICD-10-CM | POA: Diagnosis not present

## 2018-03-01 DIAGNOSIS — H353221 Exudative age-related macular degeneration, left eye, with active choroidal neovascularization: Secondary | ICD-10-CM | POA: Diagnosis not present

## 2018-03-01 DIAGNOSIS — H353124 Nonexudative age-related macular degeneration, left eye, advanced atrophic with subfoveal involvement: Secondary | ICD-10-CM | POA: Diagnosis not present

## 2018-03-01 DIAGNOSIS — H353212 Exudative age-related macular degeneration, right eye, with inactive choroidal neovascularization: Secondary | ICD-10-CM | POA: Diagnosis not present

## 2018-03-14 ENCOUNTER — Ambulatory Visit (INDEPENDENT_AMBULATORY_CARE_PROVIDER_SITE_OTHER): Payer: Medicare Other | Admitting: Surgery

## 2018-03-14 ENCOUNTER — Encounter: Payer: Self-pay | Admitting: Surgery

## 2018-03-14 ENCOUNTER — Other Ambulatory Visit: Payer: Self-pay

## 2018-03-14 VITALS — BP 95/52 | HR 97 | Temp 97.7°F | Resp 16 | Ht 65.0 in | Wt 149.5 lb

## 2018-03-14 DIAGNOSIS — I739 Peripheral vascular disease, unspecified: Secondary | ICD-10-CM | POA: Diagnosis not present

## 2018-03-14 DIAGNOSIS — I70211 Atherosclerosis of native arteries of extremities with intermittent claudication, right leg: Secondary | ICD-10-CM | POA: Diagnosis not present

## 2018-03-14 NOTE — Progress Notes (Signed)
Vascular and Vein Specialist of [place]  Patient name: Christian Bailey MRN: [id number] DOB: 1941/05/11 Sex: male   REASON FOR VISIT:    F/U  HISOTRY OF PRESENT ILLNESS:    Taron Mondor a 77 y.o.malewho is status post right femoral-popliteal bypass graft with vein on 09/18/2016. This was done for claudication. Postoperatively, the patient's ABIs were below what I thought they should be and therefore he underwent angiography which revealed a widely patent bypass graft and no evidence for the reason why the ABIs were low. He returned home and his walking improve  He recently felt that his right leg was feeling the way did before surgery. Therefore an 01/27/2017 he was taken for angiography. Approximately 60% stenosis of the common femoral and proximal right femoral-popliteal bypass graft was identified. These were successfully treated using Orbitalatherectomy and a 5 mm drug coated balloon.  His symptoms recurred. And therefore, on 09/08/2017 he underwent revision of his bypass graft. This was because of a high-grade stenosis in the proximal portion. Intraoperatively he had very dense scar tissue. I had to resect the diseased vein. I looked for other pieces of vein to serve as an interposition graft but he did not have suitable conduit. I therefore elected to resect the proximal portion of the superficial femoral artery and endarterectomized this and use this as an interposition graft between the bovine pericardial patch and the distal vein graft. He is back today for follow-up.   His symptoms are on changed and he is having trouble getting to the mailbox because of pain in his legs.  He does not have any open wounds.  I sent him for a CT angiogram and he is here today to discuss the results.   PAST MEDICAL HISTORY:   Past Medical History:  Diagnosis Date  . Anemia   . Complication of anesthesia   . Diabetes mellitus ([place])    TYPE 2  .  GERD (gastroesophageal reflux disease)   . GI bleed   . History of blood transfusion    GI bleed  . History of colon polyps   . History of hiatal hernia   . Hyperlipemia   . Hypertension   . Osteoarthritis   . PAD (peripheral artery disease) (HCC)    a. stenting of his left common iliac artery >20 years ago. b. h/o LEIA stent and 2 stents to R SFA in 2011. c. 04/2014:  s/p PTA of right SFA for in-stent restenosis, occluded left SFA  . PONV (postoperative nausea and vomiting)    no porblem with the last 3 surgeries  . RBBB (right bundle branch block with left anterior fascicular block)    NUCLEAR STRESS TEST, 08/18/2010 - no significant wall motion abnoramlities noted, post-stress EF 69%, normal myocardial perfusion study  . Sinus tachycardia    a. Noted during admission 04/2014 but upon review seems to be frequent finding for patient.  . Stenosis of carotid artery    a. 50% right carotid stenosis by angiogram 04/2014.     FAMILY HISTORY:   Family History  Problem Relation Age of Onset  . Heart disease Mother   . Leukemia Mother   . Stomach cancer Father   . Esophageal cancer Brother   . Liver disease Brother   . Alcoholism Brother   . Leukemia Brother   . Leukemia Brother   . Diabetes type II Brother   . Lung disease Sister     SOCIAL HISTORY:   Social History   Tobacco Use  .  Smoking status: Former Smoker    Years: 25.00    Last attempt to quit: 06/1988    Years since quitting: 29.7  . Smokeless tobacco: Never Used  Substance Use Topics  . Alcohol use: Yes    Comment: rarely     ALLERGIES:   Allergies  Allergen Reactions  . Asa [Aspirin] Other (See Comments)    GI bleeding  . Gadolinium Derivatives Other (See Comments)    Pt complained of face flushing/hottness and throat tightness/scratchiness immediately after the injections.  Within 4 minutes, all symptoms were gone and the study was completed.  No further complications or signs of allergy were  exhibited after completion of study.   . Oxycodone-Acetaminophen Other (See Comments)    Dizziness, uncomfortable      CURRENT MEDICATIONS:   Current Outpatient Medications  Medication Sig Dispense Refill  . clopidogrel (PLAVIX) 75 MG tablet TAKE 1 TABLET DAILY (Patient taking differently: Take 75 mg by mouth daily) 90 tablet 3  . Coenzyme Q10 (COQ10 PO) Take 1 capsule by mouth 2 (two) times daily.     [name] glucosamine-chondroitin 500-400 MG tablet Take 1 tablet by mouth 2 (two) times daily.     [name] lisinopril (PRINIVIL,ZESTRIL) 5 MG tablet Take 5 mg by mouth 2 (two) times daily.     . metFORMIN (GLUCOPHAGE) 1000 MG tablet Take 1 tablet (1,000 mg total) by mouth 2 (two) times daily with a meal.    . metoprolol succinate (TOPROL-XL) 25 MG 24 hr tablet Take 25 mg by mouth 2 (two) times daily.     . Omega-3 Fatty Acids (FISH OIL) 1200 MG CAPS Take 1,200 mg by mouth 2 (two) times daily.     . pantoprazole (PROTONIX) 40 MG tablet TAKE 2 TABLETS DAILY (Patient taking differently: Take 40 mg by mouth twice daily) 180 tablet 3  . rosuvastatin (CRESTOR) 20 MG tablet Take 20 mg by mouth every evening.      Current Facility-Administered Medications  Medication Dose Route Frequency Provider Last Rate Last Dose  . 0.9 %  sodium chloride infusion  500 mL Intravenous Once Nelida Meuse III, MD      . 0.9 %  sodium chloride infusion  500 mL Intravenous Once Danis, [place], MD        REVIEW OF SYSTEMS:   [X]  denotes positive finding, [ ]  denotes negative finding Cardiac  Comments:  Chest pain or chest pressure:    Shortness of breath upon exertion:    Short of breath when lying flat:    Irregular heart rhythm:        Vascular    Pain in calf, thigh, or hip brought on by ambulation: X   Pain in feet at night that wakes you up from your sleep:  X   Blood clot in your veins:    Leg swelling:         Pulmonary    Oxygen at home:    Productive cough:     Wheezing:         Neurologic      Sudden weakness in arms or legs:     Sudden numbness in arms or legs:     Sudden onset of difficulty speaking or slurred speech:    Temporary loss of vision in one eye:     Problems with dizziness:         Gastrointestinal    Blood in stool:     Vomited blood:  Genitourinary    Burning when urinating:     Blood in urine:        Psychiatric    Major depression:         Hematologic    Bleeding problems:    Problems with blood clotting too easily:        Skin    Rashes or ulcers:        Constitutional    Fever or chills:      PHYSICAL EXAM:   Vitals:   03/14/18 1449  BP: (!) 95/52  Pulse: 97  Resp: 16  Temp: 97.7 F (36.5 C)  TempSrc: Oral  SpO2: 95%  Weight: 149 lb 8 oz (67.8 kg)  Height: 5\' 5"  (1.651 m)    GENERAL: The patient is a well-nourished male, in no acute distress. The vital signs are documented above. CARDIAC: There is a regular rate and rhythm.  VASCULAR: NON PALPABLE PEDAL PULSES PULMONARY: Non-labored respirations MUSCULOSKELETAL: There are no major deformities or cyanosis. NEUROLOGIC: No focal weakness or paresthesias are detected. SKIN: There are no ulcers or rashes noted. PSYCHIATRIC: The patient has a normal affect.  STUDIES:   I have reviewed his CT angiogram with the following findings:  Aortic atherosclerosis, with small caliber distal aorta. Aortic Atherosclerosis (ICD10-I70.0).  Multilevel right-sided PAD, including:  -moderate to advanced right iliac atherosclerosis, including occluded hypogastric artery origin, high-grade edge stenosis at patent right EIA stent, and critical stenosis of distal external iliac artery  -the high-grade stenosis of the distal EIA appears to extend into the proximal common femoral artery  -critical stenosis at the origin of right femoropopliteal bypass, though remains patent  -occluded native right SFA  -3 tibial arteries patent from the origin to the ankle, with short segment  developing stenosis of the proximal anterior tibial artery  Multilevel left-sided PAD, including:  -moderate to advanced left iliac disease, including high-grade edge stenosis at the distal EIA stent  -small caliber left common femoral artery  -occlusion of native left SFA, with reconstitution at the abductor canal  -anterior tibial artery and posterior tibial artery patent from the origin to the ankle, with occluded posterior tibial artery  Renal artery disease, including occluded accessory left renal artery resulting in infarction of the inferior left kidney. No evidence of high-grade origin stenosis of the main left or right renal arteries.  Bronchial wall thickening of the bilateral lower lobes with some endobronchial debris on the left. This may reflect acute, chronic, or acute on chronic bronchitis. No evidence of lobar pneumonia.  MEDICAL ISSUES:   Bilateral claudication, right greater than left: I had a lengthy greater than 30-minute conversation with the patient, his wife and son.  We discussed the results of the CT scan.  I told them that I have discussed this with many of my partners and we all feel that the most likely reason for his difficulties is from aortoiliac occlusive disease.  He appears to have small caliber iliac vessels.  I told him that in my experience stenting does not usually provide the best results for this scenario.  I have recommended proceeding with an aortobifemoral bypass graft.  I discussed the risks and benefits the operation including but not limited to the risk of intra-abdominal organ injury, bleeding, pneumonia, cardiopulmonary complications, hernia.  Simultaneously, I would consider revision of the proximal right femoral-popliteal bypass graft.  The patient will need to be off of his Plavix.  I am also sending him back to Dr. Alvester Chou for cardiac  clearance.  He is going to contact me if he wishes to proceed with the operation.  I also offered him  referral to a tertiary [facility] for second opinion.    [name], MD [facility] and Vein Specialists of [facility] [phone] Pager [phone]

## 2018-04-12 DIAGNOSIS — E7849 Other hyperlipidemia: Secondary | ICD-10-CM | POA: Diagnosis not present

## 2018-04-12 DIAGNOSIS — E1151 Type 2 diabetes mellitus with diabetic peripheral angiopathy without gangrene: Secondary | ICD-10-CM | POA: Diagnosis not present

## 2018-04-12 DIAGNOSIS — Z125 Encounter for screening for malignant neoplasm of prostate: Secondary | ICD-10-CM | POA: Diagnosis not present

## 2018-04-12 DIAGNOSIS — I10 Essential (primary) hypertension: Secondary | ICD-10-CM | POA: Diagnosis not present

## 2018-04-12 DIAGNOSIS — R82998 Other abnormal findings in urine: Secondary | ICD-10-CM | POA: Diagnosis not present

## 2018-04-19 DIAGNOSIS — D62 Acute posthemorrhagic anemia: Secondary | ICD-10-CM | POA: Diagnosis not present

## 2018-04-19 DIAGNOSIS — Z95828 Presence of other vascular implants and grafts: Secondary | ICD-10-CM | POA: Diagnosis not present

## 2018-04-19 DIAGNOSIS — I10 Essential (primary) hypertension: Secondary | ICD-10-CM | POA: Diagnosis not present

## 2018-04-19 DIAGNOSIS — M1991 Primary osteoarthritis, unspecified site: Secondary | ICD-10-CM | POA: Diagnosis not present

## 2018-04-19 DIAGNOSIS — Z0000 Encounter for general adult medical examination without abnormal findings: Secondary | ICD-10-CM | POA: Diagnosis not present

## 2018-04-19 DIAGNOSIS — I2510 Atherosclerotic heart disease of native coronary artery without angina pectoris: Secondary | ICD-10-CM | POA: Diagnosis not present

## 2018-04-19 DIAGNOSIS — E1151 Type 2 diabetes mellitus with diabetic peripheral angiopathy without gangrene: Secondary | ICD-10-CM | POA: Diagnosis not present

## 2018-04-19 DIAGNOSIS — Z6824 Body mass index (BMI) 24.0-24.9, adult: Secondary | ICD-10-CM | POA: Diagnosis not present

## 2018-04-19 DIAGNOSIS — M79606 Pain in leg, unspecified: Secondary | ICD-10-CM | POA: Diagnosis not present

## 2018-04-19 DIAGNOSIS — Z1389 Encounter for screening for other disorder: Secondary | ICD-10-CM | POA: Diagnosis not present

## 2018-04-19 DIAGNOSIS — E7849 Other hyperlipidemia: Secondary | ICD-10-CM | POA: Diagnosis not present

## 2018-04-19 DIAGNOSIS — I739 Peripheral vascular disease, unspecified: Secondary | ICD-10-CM | POA: Diagnosis not present

## 2018-04-19 DIAGNOSIS — Z23 Encounter for immunization: Secondary | ICD-10-CM | POA: Diagnosis not present

## 2018-04-22 DIAGNOSIS — Z1212 Encounter for screening for malignant neoplasm of rectum: Secondary | ICD-10-CM | POA: Diagnosis not present

## 2018-04-25 DIAGNOSIS — H353221 Exudative age-related macular degeneration, left eye, with active choroidal neovascularization: Secondary | ICD-10-CM | POA: Diagnosis not present

## 2018-04-25 DIAGNOSIS — H353113 Nonexudative age-related macular degeneration, right eye, advanced atrophic without subfoveal involvement: Secondary | ICD-10-CM | POA: Diagnosis not present

## 2018-04-25 DIAGNOSIS — H353212 Exudative age-related macular degeneration, right eye, with inactive choroidal neovascularization: Secondary | ICD-10-CM | POA: Diagnosis not present

## 2018-04-25 DIAGNOSIS — H353124 Nonexudative age-related macular degeneration, left eye, advanced atrophic with subfoveal involvement: Secondary | ICD-10-CM | POA: Diagnosis not present

## 2018-04-26 DIAGNOSIS — L602 Onychogryphosis: Secondary | ICD-10-CM | POA: Diagnosis not present

## 2018-04-26 DIAGNOSIS — E1351 Other specified diabetes mellitus with diabetic peripheral angiopathy without gangrene: Secondary | ICD-10-CM | POA: Diagnosis not present

## 2018-04-26 DIAGNOSIS — I739 Peripheral vascular disease, unspecified: Secondary | ICD-10-CM | POA: Diagnosis not present

## 2018-04-28 ENCOUNTER — Telehealth: Payer: Self-pay | Admitting: Cardiovascular Disease

## 2018-04-28 NOTE — Telephone Encounter (Signed)
Received records from [facility] on 04/28/18, Appt 06/07/18 @ 9:15AM. [place]

## 2018-05-27 ENCOUNTER — Other Ambulatory Visit: Payer: Self-pay | Admitting: Cardiovascular Disease

## 2018-05-29 ENCOUNTER — Other Ambulatory Visit: Payer: Self-pay | Admitting: Cardiovascular Disease

## 2018-05-31 ENCOUNTER — Other Ambulatory Visit: Payer: Self-pay | Admitting: *Deleted

## 2018-05-31 ENCOUNTER — Telehealth: Payer: Self-pay | Admitting: *Deleted

## 2018-05-31 NOTE — Telephone Encounter (Signed)
Request for Surgical Clearance  1. What type of surgery is being performed?  AORTO BIFEMORAL BYPASS GRAFT    2. When is this surgery scheduled? 07/06/2018  3. What type of clearance is required (medical clearance vs. Pharmacy clearance to hold med vs. Both)? CARDIAC AND MEDICATION HOLD    4. Are there any medications that need to be held prior to surgery and how long?  PLAVIX HOLD X 5 DAYS PRE-OP    5. Practice name and name of physician performing surgery?   [name] [facility]    6.  What is your office phone number? [phone]    7. What is your office fax number? (Be sure to include anyone who it needs to go Attn to) ATTN. BECKY [phone]    8. Anesthesia type (None, local, MAC, general)? GENERAL  *PATIENT HAS APPOINTMENT WITH DR. Gwenlyn Found ON 06/07/18   REMINDER TO USER: Remember to please route this message to P CV DIV PREOP in a phone note.

## 2018-06-01 ENCOUNTER — Other Ambulatory Visit: Payer: Self-pay | Admitting: *Deleted

## 2018-06-01 DIAGNOSIS — I739 Peripheral vascular disease, unspecified: Secondary | ICD-10-CM

## 2018-06-01 MED ORDER — AMOXICILLIN 500 MG PO CAPS: ORAL_CAPSULE | ORAL | 2 refills | Status: DC

## 2018-06-07 ENCOUNTER — Ambulatory Visit (INDEPENDENT_AMBULATORY_CARE_PROVIDER_SITE_OTHER): Payer: Medicare Other | Admitting: Cardiovascular Disease

## 2018-06-07 ENCOUNTER — Encounter: Payer: Self-pay | Admitting: Cardiovascular Disease

## 2018-06-07 VITALS — BP 98/62 | HR 72 | Ht 65.0 in | Wt 148.6 lb

## 2018-06-07 DIAGNOSIS — Z01818 Encounter for other preprocedural examination: Secondary | ICD-10-CM | POA: Diagnosis not present

## 2018-06-07 DIAGNOSIS — R0989 Other specified symptoms and signs involving the circulatory and respiratory systems: Secondary | ICD-10-CM | POA: Diagnosis not present

## 2018-06-07 DIAGNOSIS — I70211 Atherosclerosis of native arteries of extremities with intermittent claudication, right leg: Secondary | ICD-10-CM | POA: Diagnosis not present

## 2018-06-07 DIAGNOSIS — I10 Essential (primary) hypertension: Secondary | ICD-10-CM | POA: Diagnosis not present

## 2018-06-07 DIAGNOSIS — I452 Bifascicular block: Secondary | ICD-10-CM

## 2018-06-07 DIAGNOSIS — I739 Peripheral vascular disease, unspecified: Secondary | ICD-10-CM | POA: Diagnosis not present

## 2018-06-07 NOTE — Patient Instructions (Signed)
Medication Instructions:  Your physician recommends that you continue on your current medications as directed. Please refer to the Current Medication list given to you today.  If you need a refill on your cardiac medications before your next appointment, please call your pharmacy.   Lab work: NONE If you have labs (blood work) drawn today and your tests are completely normal, you will receive your results only by: [name] MyChart Message (if you have MyChart) OR . A paper copy in the mail If you have any lab test that is abnormal or we need to change your treatment, we will call you to review the results.  Testing/Procedures: Your physician has requested that you have an echocardiogram. Echocardiography is a painless test that uses sound waves to create images of your heart. It provides your doctor with information about the size and shape of your heart and how well your heart's chambers and valves are working. This procedure takes approximately one hour. There are no restrictions for this procedure. SCHEDULE WITHIN 2 WEEKS  Your physician has requested that you have a carotid duplex. This test is an ultrasound of the carotid arteries in your neck. It looks at blood flow through these arteries that supply the brain with blood. Allow one hour for this exam. There are no restrictions or special instructions. SCHEDULE WITHIN 2 WEEKS  Your physician has requested that you have a lexiscan myoview. For further information please visit [email or web address]. Please follow instruction sheet, as given. SCHEDULE WITHIN 2 WEEKS  Follow-Up: At [facility], you and your health needs are our priority.  As part of our continuing mission to provide you with exceptional heart care, we have created designated Provider Care Teams.  These Care Teams include your primary Cardiologist (physician) and Advanced Practice Providers (APPs -  Physician Assistants and Nurse Practitioners) who all work together to provide you  with the care you need, when you need it. You will need a follow up appointment in 6 months.  Please call our office 2 months in advance to schedule this appointment.  You may see DR. BERRY or one of the following Advanced Practice Providers on your designated Care Team:   Kerin Ransom, PA-C [place], [place] . Sande Rives, PA-C

## 2018-06-07 NOTE — Assessment & Plan Note (Signed)
Chronic. 

## 2018-06-07 NOTE — Progress Notes (Signed)
06/07/2018 Christian Bailey   [birth year]  [id number]  Primary Physician Christian Solian, MD Primary Cardiologist: Christian Harp MD Christian Bailey, [place]  HPI:  Christian Bailey is a 77 y.o.  married Caucasian male, father of 2, grandfather to 1 grandchild who I last saw  05/28/2017. [name] KitchenHis daughter-in-law , Dr. Tiajuana Bailey. , is a primary care physician at [facility]. He has a history of PVOD and stenting of his left common iliac artery 20 years ago. He was smoking 3 packs a day at that time but stopped smoking 20 years ago.   His other problems include hypertension, hyperlipidemia and noninsulin-requiring diabetes. He had a negative Myoview in our office August 18, 2010. Because of claudication, I performed angiograph on him May 12, 2010, revealing an intact left common iliac artery stent, high-grade left external iliac artery stenosis which I stented using a 7 mm x 3 cm long Nitinol self-expanding stent. I also put 2 stents in his right SFA. Followup Dopplers showed improvement but symptomatically he did not really improve much. He still complains of predictable bilateral lower extremity claudication at several hundred yards. His Dopplers have remained stable. Dr. Dagmar Bailey follows his lipid profile which was recently done earlier this month revealing a total cholesterol of 153, LDL of 86 and HDL of 34. Recent lower with Doppler studies performed in October 2014 revealed patent stents with a right ABI of 0.81 and a left of 0.67.over the past year he's noticed progressive but without limiting claudication with recent Dopplers performed 04/04/14 revealing a decline in his right ABI from 0.81-0.53 with progression of disease in his right SFA suggesting "in-stent restenosis.I also performed carotid Doppler studies that suggested moderately severe right internal carotid artery stenosis.   As a result of these studies I performed angiography on him 05/14/14 revealing a patent left external iliac artery  stent with at most 30-40% "in-stent restenosis, and known occluded left SFA with two-vessel runoff (occluded posterior tibial), patent proximal right SFA stent with diffuse "95% in-stent restenosis" within the distal right SFA stent. He had three-vessel runoff on the right. I performed a drug eluting balloon angioplasty for the in-stent restenosis with excellent angiographic and Doppler result although his symptoms did not significantly improve. I also angiogram his carotids because of a moderately severe right ICA stenosis by duplex ultrasound revealing a 50% hypodense right internal carotid artery stenosis with a bovine arch. We have continued to follow these noninvasively. He is neurologically asymptomatic. He denies chest pain or shortness of breath. Since I saw him a year Christian Bailey has noticed increasing claudication bilaterally. We obtained lower extremity Dopplers 12/09/15 revealing a decline in his right ABI from 0.85 down to 0.68 and occluded distal right SFA. We did begin him on Pletal which she has taken for last several months with minimal benefit.I angiogram 1010/23/17 revealing a widely patent left iliac stent, occluded left SFA with two-vessel runoff and a occluded right SFA with two-vessel runoff. I did not think he had percutaneous options for revascularization and he therefore underwent right femoropopliteal bypass grafting by Dr. Trula Bailey in March of this year. He's been reinjured them twice demonstrating a patent graft despite the fact that his symptoms have gotten worse. He is also had a reverse right shoulder replacement by Dr. Veverly Bailey in June of this year. He denies chest pain or shortness of breath.  I saw him a year ago he has had revision of his right femoropopliteal bypass graft by Dr. Trula Bailey in March.  He continues to complain of lifestyle limiting claudication.  He had aortoiliac CTA that showed small caliber iliac arteries based on this Dr. Trula Bailey has suggested that he undergo  aortobifemoral bypass grafting. Current Meds  Medication Sig  . amoxicillin (AMOXIL) 500 MG capsule Take 2 grams (4 tablets) by mouth 1 hour prior to procedure.  . clopidogrel (PLAVIX) 75 MG tablet Take 1 tablet (75 mg total) by mouth daily.  . Coenzyme Q10 (COQ10 PO) Take 1 capsule by mouth 2 (two) times daily.   [name] glucosamine-chondroitin 500-400 MG tablet Take 1 tablet by mouth 2 (two) times daily.   [name] lisinopril (PRINIVIL,ZESTRIL) 5 MG tablet Take 5 mg by mouth 2 (two) times daily.   . metFORMIN (GLUCOPHAGE) 1000 MG tablet Take 1 tablet (1,000 mg total) by mouth 2 (two) times daily with a meal.  . metoprolol succinate (TOPROL-XL) 25 MG 24 hr tablet Take 25 mg by mouth 2 (two) times daily.   . Omega-3 Fatty Acids (FISH OIL) 1200 MG CAPS Take 1,200 mg by mouth 2 (two) times daily.   . pantoprazole (PROTONIX) 40 MG tablet Take 40 mg by mouth twice daily  . rosuvastatin (CRESTOR) 20 MG tablet Take 20 mg by mouth every evening.    Current Facility-Administered Medications for the 06/07/18 encounter (Office Visit) with Christian Harp, MD  Medication  . 0.9 %  sodium chloride infusion  . 0.9 %  sodium chloride infusion     Allergies  Allergen Reactions  . Asa [Aspirin] Other (See Comments)    GI bleeding  . Gadolinium Derivatives Other (See Comments)    Pt complained of face flushing/hottness and throat tightness/scratchiness immediately after the injections.  Within 4 minutes, all symptoms were gone and the study was completed.  No further complications or signs of allergy were exhibited after completion of study.   . Oxycodone-Acetaminophen Other (See Comments)    Dizziness, uncomfortable     Social History   Socioeconomic History  . Marital status: Married    Spouse name: Not on file  . Number of children: 2  . Years of education: Not on file  . Highest education level: Not on file  Occupational History  . Occupation: retired    Fish farm manager: RETIRED  Social Needs  . Financial  resource strain: Not on file  . Food insecurity:    Worry: Not on file    Inability: Not on file  . Transportation needs:    Medical: Not on file    Non-medical: Not on file  Tobacco Use  . Smoking status: Former Smoker    Years: 25.00    Last attempt to quit: 06/1988    Years since quitting: 29.9  . Smokeless tobacco: Never Used  Substance and Sexual Activity  . Alcohol use: Yes    Comment: rarely  . Drug use: No  . Sexual activity: Not on file  Lifestyle  . Physical activity:    Days per week: Not on file    Minutes per session: Not on file  . Stress: Not on file  Relationships  . Social connections:    Talks on phone: Not on file    Gets together: Not on file    Attends religious service: Not on file    Active member of club or organization: Not on file    Attends meetings of clubs or organizations: Not on file    Relationship status: Not on file  . Intimate partner violence:    Fear of current  or ex partner: Not on file    Emotionally abused: Not on file    Physically abused: Not on file    Forced sexual activity: Not on file  Other Topics Concern  . Not on file  Social History Narrative  . Not on file     Review of Systems: General: negative for chills, fever, night sweats or weight changes.  Cardiovascular: negative for chest pain, dyspnea on exertion, edema, orthopnea, palpitations, paroxysmal nocturnal dyspnea or shortness of breath Dermatological: negative for rash Respiratory: negative for cough or wheezing Urologic: negative for hematuria Abdominal: negative for nausea, vomiting, diarrhea, bright red blood per rectum, melena, or hematemesis Neurologic: negative for visual changes, syncope, or dizziness All other systems reviewed and are otherwise negative except as noted above.    Blood pressure 98/62, pulse 72, height 5\' 5"  (1.651 m), weight 148 lb 9.6 oz (67.4 kg).  General appearance: alert and no distress Neck: no adenopathy, no JVD, supple,  symmetrical, trachea midline, thyroid not enlarged, symmetric, no tenderness/mass/nodules and Soft bilateral carotid bruits Lungs: clear to auscultation bilaterally Heart: regular rate and rhythm, S1, S2 normal, no murmur, click, rub or gallop Extremities: extremities normal, atraumatic, no cyanosis or edema Pulses: Diminished pedal pulses bilaterally Skin: Skin color, texture, turgor normal. No rashes or lesions Neurologic: Alert and oriented X 3, normal strength and tone. Normal symmetric reflexes. Normal coordination and gait  EKG sinus rhythm at 77 with right bundle branch block.  I personally reviewed this EKG.  ASSESSMENT AND PLAN:   PAD (peripheral artery disease), hx remote stent Lt common iliac, 04/2010-stent to lt common iliac & 2 stents to the Rt SFA, 04/2014 - s/p PTA of right SFA for in-stent restenosis  History of peripheral arterial disease status post stenting of his left iliac artery 20 years ago, stenting of his right SFA by myself remotely as well and subsequent right femoropopliteal bypass grafting with revision of his graft.  He continues to complain of claudication.  He is followed by Dr. Trula Bailey.  Apparently a CT angiogram showed small caliber iliac arteries and he is scheduled for aortobifemoral bypass grafting in January for lifestyle limiting claudication.  I am going to preoperative clinically clear him with a 2D echo and pharmacologic Myoview stress test.  RBBB (right bundle branch block with left anterior fascicular block) Chronic  Essential hypertension History of essential hypertension her blood pressure measured today at 98/62.  He is on lisinopril and metoprolol.  Continue current meds at current dosing.  Hyperlipidemia History of hyperlipidemia on statin therapy with lipid profile performed 04/12/2018 revealing total cluster 126, LDL 57 and HDL 33.  Carotid artery disease History of carotid artery disease bilateral carotid bruits.  Will recheck carotid  Doppler studies.      Christian Harp MD FACP,FACC,FAHA, [facility] 06/07/2018 10:06 AM

## 2018-06-07 NOTE — Assessment & Plan Note (Signed)
History of carotid artery disease bilateral carotid bruits.  Will recheck carotid Doppler studies.

## 2018-06-07 NOTE — Assessment & Plan Note (Signed)
History of peripheral arterial disease status post stenting of his left iliac artery 20 years ago, stenting of his right SFA by myself remotely as well and subsequent right femoropopliteal bypass grafting with revision of his graft.  He continues to complain of claudication.  He is followed by [name].  Apparently a CT angiogram showed small caliber iliac arteries and he is scheduled for aortobifemoral bypass grafting in January for lifestyle limiting claudication.  I am going to preoperative clinically clear him with a 2D echo and pharmacologic Myoview stress test.

## 2018-06-07 NOTE — Assessment & Plan Note (Signed)
History of hyperlipidemia on statin therapy with lipid profile performed 04/12/2018 revealing total cluster 126, LDL 57 and HDL 33.

## 2018-06-07 NOTE — Assessment & Plan Note (Signed)
History of essential hypertension her blood pressure measured today at 98/62.  He is on lisinopril and metoprolol.  Continue current meds at current dosing.

## 2018-06-14 ENCOUNTER — Telehealth (HOSPITAL_COMMUNITY): Payer: Self-pay

## 2018-06-14 NOTE — Telephone Encounter (Signed)
Encounter complete. 

## 2018-06-15 ENCOUNTER — Other Ambulatory Visit: Payer: Self-pay | Admitting: *Deleted

## 2018-06-16 ENCOUNTER — Ambulatory Visit (HOSPITAL_COMMUNITY)
Admission: RE | Admit: 2018-06-16 | Discharge: 2018-06-16 | Disposition: A | Discharge status: Home / Self Care | Payer: Medicare Other | Source: Ambulatory Visit | Attending: Cardiology | Admitting: Cardiology

## 2018-06-16 ENCOUNTER — Ambulatory Visit (HOSPITAL_BASED_OUTPATIENT_CLINIC_OR_DEPARTMENT_OTHER)
Admission: RE | Admit: 2018-06-16 | Discharge: 2018-06-16 | Disposition: A | Discharge status: Home / Self Care | Payer: Medicare Other | Source: Ambulatory Visit | Attending: Cardiovascular Disease | Admitting: Cardiovascular Disease

## 2018-06-16 DIAGNOSIS — Z01818 Encounter for other preprocedural examination: Secondary | ICD-10-CM | POA: Diagnosis not present

## 2018-06-16 DIAGNOSIS — I452 Bifascicular block: Secondary | ICD-10-CM | POA: Diagnosis not present

## 2018-06-16 DIAGNOSIS — I739 Peripheral vascular disease, unspecified: Secondary | ICD-10-CM | POA: Diagnosis not present

## 2018-06-16 DIAGNOSIS — I10 Essential (primary) hypertension: Secondary | ICD-10-CM | POA: Diagnosis not present

## 2018-06-16 DIAGNOSIS — I6521 Occlusion and stenosis of right carotid artery: Secondary | ICD-10-CM

## 2018-06-16 LAB — MYOCARDIAL PERFUSION IMAGING
CHL CUP NUCLEAR SDS: 2
LV dias vol: 72 mL (ref 62–150)
LV sys vol: 31 mL
Peak HR: 125 {beats}/min
Rest HR: 76 {beats}/min
SRS: 10
SSS: 12
TID: 1.08

## 2018-06-16 MED ORDER — REGADENOSON 0.4 MG/5ML IV SOLN
0.4000 mg | Freq: Once | INTRAVENOUS | Status: AC
  Administered 2018-06-16: 0.4 mg via INTRAVENOUS

## 2018-06-16 MED ORDER — TECHNETIUM TC 99M TETROFOSMIN IV KIT
31.2000 | PACK | Freq: Once | INTRAVENOUS | Status: AC | PRN
  Administered 2018-06-16: 31.2 via INTRAVENOUS
  Filled 2018-06-16: qty 32

## 2018-06-16 MED ORDER — TECHNETIUM TC 99M TETROFOSMIN IV KIT
10.4000 | PACK | Freq: Once | INTRAVENOUS | Status: AC | PRN
  Administered 2018-06-16: 10.4 via INTRAVENOUS
  Filled 2018-06-16: qty 11

## 2018-06-17 ENCOUNTER — Encounter (HOSPITAL_COMMUNITY): Payer: Medicare Other

## 2018-06-20 ENCOUNTER — Ambulatory Visit (HOSPITAL_COMMUNITY): Payer: Medicare Other | Attending: Cardiovascular Disease

## 2018-06-20 ENCOUNTER — Other Ambulatory Visit: Payer: Self-pay

## 2018-06-20 ENCOUNTER — Other Ambulatory Visit: Payer: Self-pay | Admitting: *Deleted

## 2018-06-20 DIAGNOSIS — I6521 Occlusion and stenosis of right carotid artery: Secondary | ICD-10-CM

## 2018-06-20 DIAGNOSIS — I10 Essential (primary) hypertension: Secondary | ICD-10-CM

## 2018-06-20 MED ORDER — PERFLUTREN LIPID MICROSPHERE
1.0000 mL | INTRAVENOUS | Status: AC | PRN
  Administered 2018-06-20: 2 mL via INTRAVENOUS

## 2018-06-23 ENCOUNTER — Telehealth: Payer: Self-pay | Admitting: *Deleted

## 2018-06-23 NOTE — Telephone Encounter (Signed)
Surgical Clearance  1. What type of surgery is being performed? AORTO BIFEMORAL BYPASS GRAFT   2. When is this surgery scheduled? 07/06/2018  3. What type of clearance is required (medical clearance vs. Pharmacy clearance to hold med vs. Both)? CARDIAC AND MEDICATION HOLD   4. Are there any medications that need to be held prior to surgery and how long? PLAVIX HOLD X 5 DAYS PRE-OP   5. Practice name and name of physician performing surgery? [name] [facility]   6. What is your office phone number? [phone]   7. What is your office fax number? (Be sure to include anyone who it needs to go Attn to) ATTN. BECKY [phone]   8. Anesthesia type (None, local, MAC, general)? GENERAL  CARDIAC CLEARANCE FOR SURGERY S/P LEXISCAN AND ECHO

## 2018-06-25 IMAGING — CR DG CHEST 2V
2 series · 2 of 2 positions shown · non-contrast
Comparison: Radiographs May 08, 2014.

CLINICAL DATA: Claudication, hypertension.

EXAM:
CHEST  2 VIEW

[w chest pa]
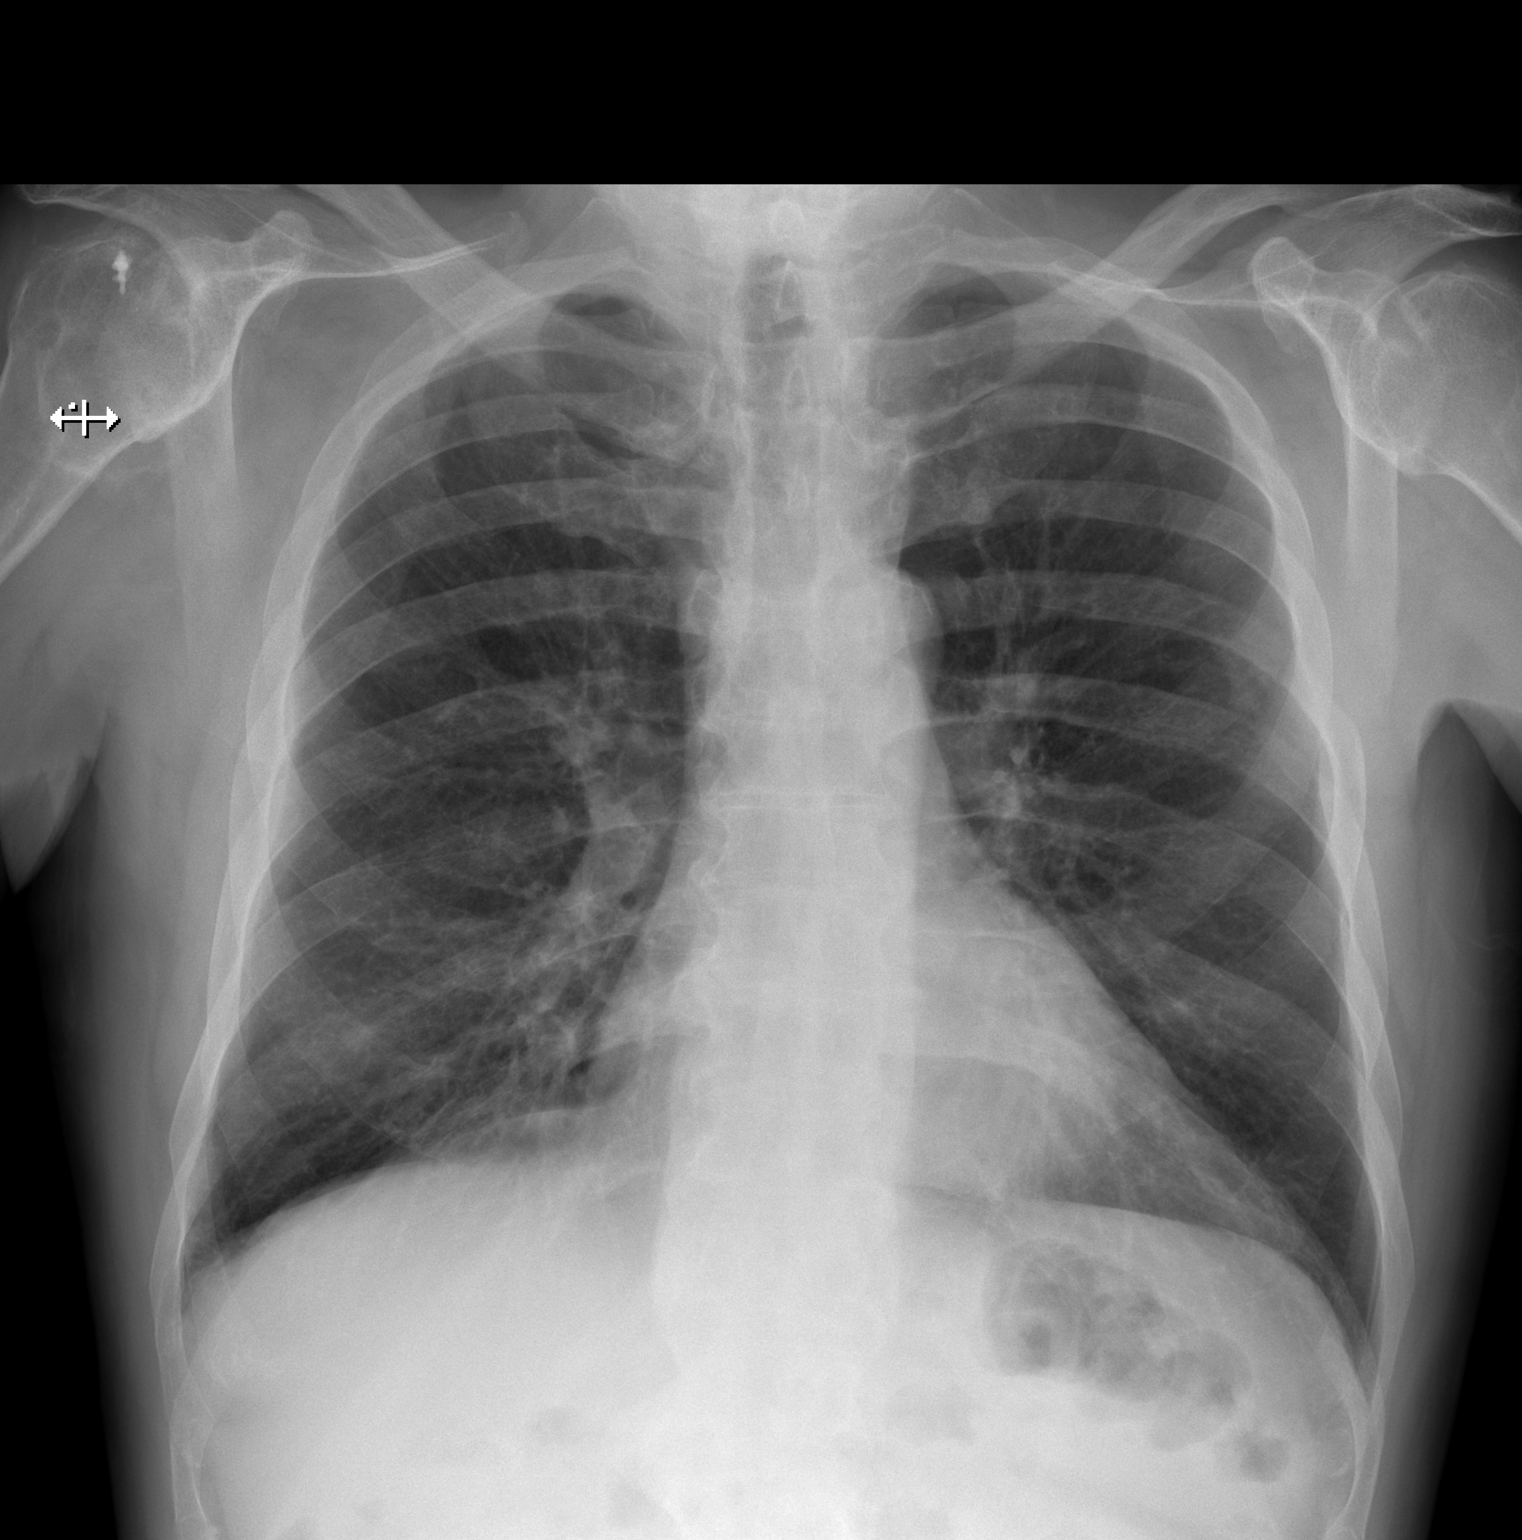

[w chest lat]
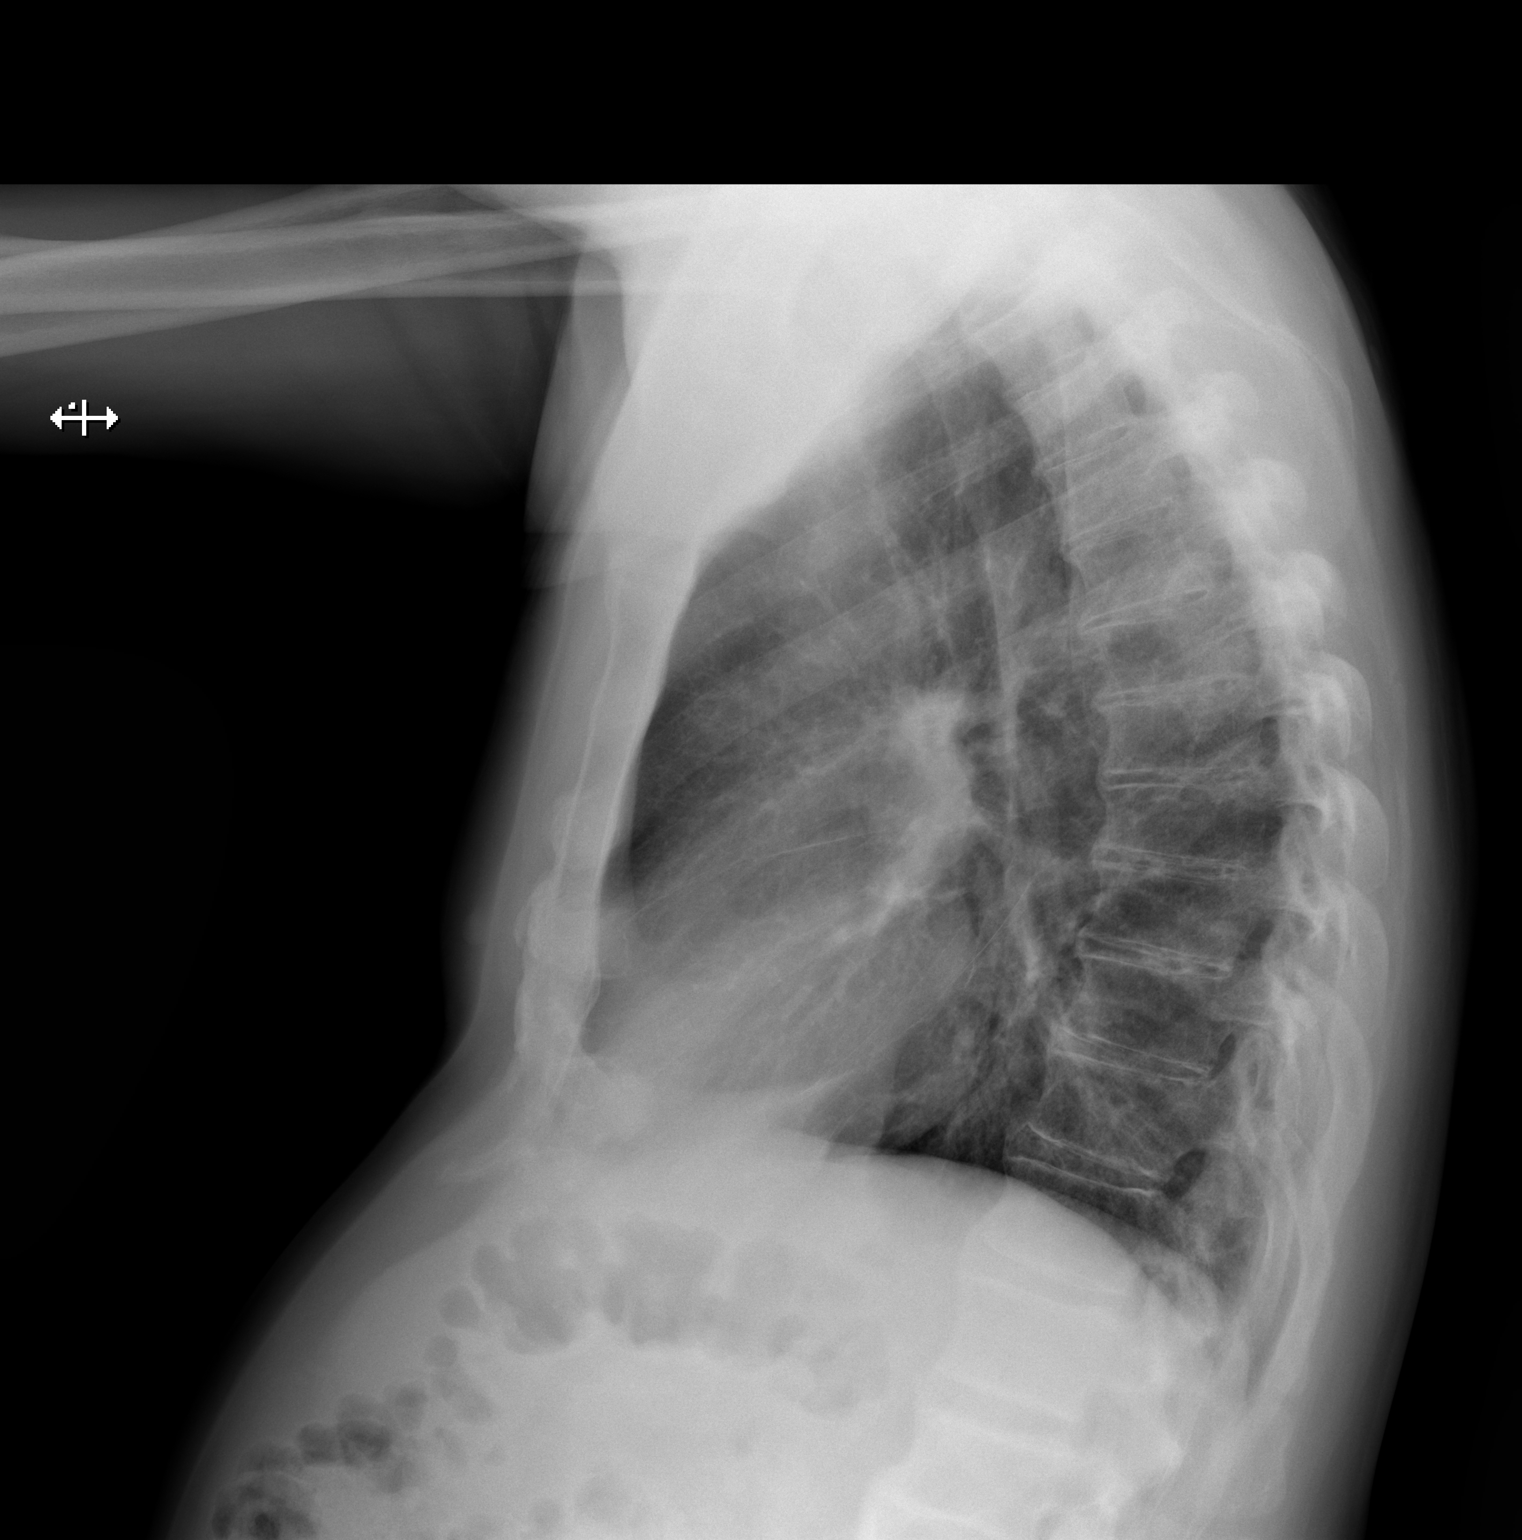

[2 of 2 positions shown; findings below may reference images not displayed]

FINDINGS: The heart size and mediastinal contours are within normal limits. No
pneumothorax or pleural effusion is noted. Atherosclerosis of
thoracic aorta is noted. Right lung is clear. Nodular density is
noted laterally in the left midlung which was not present on prior
exam. The visualized skeletal structures are unremarkable.
IMPRESSION: Aortic atherosclerosis. Nodular density seen laterally in left
midlung which may represent pulmonary nodule. CT scan is recommended
for further evaluation. These results will be called to the ordering
clinician or representative by the Radiologist Assistant, and
communication documented in the PACS or zVision Dashboard.

## 2018-06-28 DIAGNOSIS — H353212 Exudative age-related macular degeneration, right eye, with inactive choroidal neovascularization: Secondary | ICD-10-CM | POA: Diagnosis not present

## 2018-06-28 DIAGNOSIS — H353124 Nonexudative age-related macular degeneration, left eye, advanced atrophic with subfoveal involvement: Secondary | ICD-10-CM | POA: Diagnosis not present

## 2018-06-28 DIAGNOSIS — H353221 Exudative age-related macular degeneration, left eye, with active choroidal neovascularization: Secondary | ICD-10-CM | POA: Diagnosis not present

## 2018-06-28 DIAGNOSIS — H353113 Nonexudative age-related macular degeneration, right eye, advanced atrophic without subfoveal involvement: Secondary | ICD-10-CM | POA: Diagnosis not present

## 2018-06-30 ENCOUNTER — Encounter: Payer: Self-pay | Admitting: Surgery

## 2018-06-30 NOTE — Pre-Procedure Instructions (Signed)
Christian Bailey  06/30/2018      [id number] [address] [place], [place] [address] [place] [address] Phone: [phone] Fax: [phone]  [id number] [place], [address] [place] [address] Phone: [phone] Fax: [facility] [address], [place] [address] [place] [address] Phone: [phone] Fax: [phone]  [facility] [place], [place] [address] [place] [address] Phone: [phone] Fax: [phone]    Your procedure is scheduled on 07/06/2018.  Report to [facility] Admitting at [email or web address].M.  Call this number if you have problems the morning of surgery:  [phone]   Remember:  Do not eat or drink after midnight.     Take these medicines the morning of surgery with A SIP OF WATER: Metoprolol succinate (Toprol-XL) Pantoprazole (Protonix)  7 days prior to surgery STOP taking any Aleve, Naproxen, Ibuprofen, Motrin, Advil, Goody's, BC's, all herbal medications, fish oil, and all vitamins.  Follow your surgeon's instructions on when to stop Asprin and Plavix.  If no instructions were given by your surgeon then you will need to call the office to get those instructions.    WHAT DO I DO ABOUT MY DIABETES MEDICATION?  [name] Do not take oral diabetes medicines (pills) the morning of surgery. - Do not take your Metformin (Glucophage) the morning of your surgery   How to Manage Your Diabetes Before and After Surgery  Why is it important to control my blood sugar before and after surgery? . Improving blood sugar levels before and after surgery helps healing and can limit problems. . A way of improving blood sugar control is eating a healthy diet by: o  Eating less sugar and carbohydrates o  Increasing  activity/exercise o  Talking with your doctor about reaching your blood sugar goals . High blood sugars (greater than 180 mg/dL) can raise your risk of infections and slow your recovery, so you will need to focus on controlling your diabetes during the weeks before surgery. . Make sure that the doctor who takes care of your diabetes knows about your planned surgery including the date and location.  How do I manage my blood sugar before surgery? . Check your blood sugar at least 4 times a day, starting 2 days before surgery, to make sure that the level is not too high or low. o Check your blood sugar the morning of your surgery when you wake up and every 2 hours until you get to the Short Stay unit. . If your blood sugar is less than 70 mg/dL, you will need to treat for low blood sugar: o Do not take insulin. o Treat a low blood sugar (less than 70 mg/dL) with  cup of clear juice (cranberry or apple), 4 glucose tablets, OR glucose gel. o Recheck blood sugar in 15 minutes after treatment (to make sure it is greater than 70 mg/dL). If your blood sugar is not greater than 70 mg/dL on recheck, call [phone] for further instructions. . Report your blood sugar to the short stay nurse when you get to Short Stay.  . If you are admitted to the hospital after surgery: o Your blood sugar will be checked by the staff and you will probably be given insulin after surgery (instead of oral diabetes medicines) to make sure you have good  blood sugar levels. o The goal for blood sugar control after surgery is 80-180 mg/dL.     Do not wear jewelry, make-up or nail polish.  Do not wear lotions, powders, or perfumes, or deodorant.  Do not shave 48 hours prior to surgery.  Men may shave face and neck.  Do not bring valuables to the hospital.  [facility] is not responsible for any belongings or valuables.  Contacts, eyeglasses, hearing aids, dentures or bridgework may not be worn into surgery.  Leave your  suitcase in the car.  After surgery it may be brought to your room.  For patients admitted to the hospital, discharge time will be determined by your treatment team.  Patients discharged the day of surgery will not be allowed to drive home.   Name and phone number of your driver:    Special instructions:   [facility]- Preparing For Surgery  Before surgery, you can play an important role. Because skin is not sterile, your skin needs to be as free of germs as possible. You can reduce the number of germs on your skin by washing with CHG (chlorahexidine gluconate) Soap before surgery.  CHG is an antiseptic cleaner which kills germs and bonds with the skin to continue killing germs even after washing.    Oral Hygiene is also important to reduce your risk of infection.  Remember - BRUSH YOUR TEETH THE MORNING OF SURGERY WITH YOUR REGULAR TOOTHPASTE  Please do not use if you have an allergy to CHG or antibacterial soaps. If your skin becomes reddened/irritated stop using the CHG.  Do not shave (including legs and underarms) for at least 48 hours prior to first CHG shower. It is OK to shave your face.  Please follow these instructions carefully.   1. Shower the NIGHT BEFORE SURGERY and the MORNING OF SURGERY with CHG.   2. If you chose to wash your hair, wash your hair first as usual with your normal shampoo.  3. After you shampoo, rinse your hair and body thoroughly to remove the shampoo.  4. Use CHG as you would any other liquid soap. You can apply CHG directly to the skin and wash gently with a scrungie or a clean washcloth.   5. Apply the CHG Soap to your body ONLY FROM THE NECK DOWN.  Do not use on open wounds or open sores. Avoid contact with your eyes, ears, mouth and genitals (private parts). Wash Face and genitals (private parts)  with your normal soap.  6. Wash thoroughly, paying special attention to the area where your surgery will be performed.  7. Thoroughly rinse your body with  warm water from the neck down.  8. DO NOT shower/wash with your normal soap after using and rinsing off the CHG Soap.  9. Pat yourself dry with a CLEAN TOWEL.  10. Wear CLEAN PAJAMAS to bed the night before surgery, wear comfortable clothes the morning of surgery  11. Place CLEAN SHEETS on your bed the night of your first shower and DO NOT SLEEP WITH PETS.    Day of Surgery: Shower as stated above. Do not apply any deodorants/lotions.  Please wear clean clothes to the hospital/surgery center.   Remember to brush your teeth WITH YOUR REGULAR TOOTHPASTE.    Please read over the following fact sheets that you were given.

## 2018-07-01 ENCOUNTER — Encounter (HOSPITAL_COMMUNITY)
Admission: RE | Admit: 2018-07-01 | Discharge: 2018-07-01 | Disposition: A | Discharge status: Home / Self Care | Payer: Medicare Other | Source: Ambulatory Visit | Attending: Surgery | Admitting: Surgery

## 2018-07-01 ENCOUNTER — Other Ambulatory Visit: Payer: Self-pay

## 2018-07-01 ENCOUNTER — Encounter (HOSPITAL_COMMUNITY): Payer: Self-pay

## 2018-07-01 DIAGNOSIS — Z01812 Encounter for preprocedural laboratory examination: Secondary | ICD-10-CM | POA: Diagnosis not present

## 2018-07-01 LAB — BLOOD GAS, ARTERIAL
Acid-Base Excess: 1.1 mmol/L (ref 0.0–2.0)
Bicarbonate: 24.3 mmol/L (ref 20.0–28.0)
DRAWN BY: 449841
FIO2: 21
O2 Saturation: 98.8 %
Patient temperature: 98.6
pCO2 arterial: 33.2 mmHg (ref 32.0–48.0)
pH, Arterial: 7.478 — ABNORMAL HIGH (ref 7.350–7.450)
pO2, Arterial: 116 mmHg — ABNORMAL HIGH (ref 83.0–108.0)

## 2018-07-01 LAB — CBC
HCT: 34.4 % — ABNORMAL LOW (ref 39.0–52.0)
Hemoglobin: 11 g/dL — ABNORMAL LOW (ref 13.0–17.0)
MCH: 30.1 pg (ref 26.0–34.0)
MCHC: 32 g/dL (ref 30.0–36.0)
MCV: 94 fL (ref 80.0–100.0)
Platelets: 304 10*3/uL (ref 150–400)
RBC: 3.66 MIL/uL — ABNORMAL LOW (ref 4.22–5.81)
RDW: 13.1 % (ref 11.5–15.5)
WBC: 7.3 10*3/uL (ref 4.0–10.5)
nRBC: 0 % (ref 0.0–0.2)

## 2018-07-01 LAB — URINALYSIS, ROUTINE W REFLEX MICROSCOPIC
BILIRUBIN URINE: NEGATIVE
Glucose, UA: NEGATIVE mg/dL
Hgb urine dipstick: NEGATIVE
Ketones, ur: NEGATIVE mg/dL
Leukocytes, UA: NEGATIVE
Nitrite: NEGATIVE
Protein, ur: NEGATIVE mg/dL
Specific Gravity, Urine: 1.004 — ABNORMAL LOW (ref 1.005–1.030)
pH: 6 (ref 5.0–8.0)

## 2018-07-01 LAB — COMPREHENSIVE METABOLIC PANEL
ALT: 13 U/L (ref 0–44)
AST: 20 U/L (ref 15–41)
Albumin: 3.8 g/dL (ref 3.5–5.0)
Alkaline Phosphatase: 70 U/L (ref 38–126)
Anion gap: 10 (ref 5–15)
BUN: 24 mg/dL — ABNORMAL HIGH (ref 8–23)
CO2: 23 mmol/L (ref 22–32)
Calcium: 9.3 mg/dL (ref 8.9–10.3)
Chloride: 106 mmol/L (ref 98–111)
Creatinine, Ser: 1.43 mg/dL — ABNORMAL HIGH (ref 0.61–1.24)
GFR calc Af Amer: 55 mL/min — ABNORMAL LOW (ref >60)
GFR calc non Af Amer: 47 mL/min — ABNORMAL LOW (ref >60)
Glucose, Bld: 119 mg/dL — ABNORMAL HIGH (ref 70–99)
Potassium: 4.6 mmol/L (ref 3.5–5.1)
Sodium: 139 mmol/L (ref 135–145)
Total Bilirubin: 0.2 mg/dL — ABNORMAL LOW (ref 0.3–1.2)
Total Protein: 6.9 g/dL (ref 6.5–8.1)

## 2018-07-01 LAB — GLUCOSE, CAPILLARY: Glucose-Capillary: 156 mg/dL — ABNORMAL HIGH (ref 70–99)

## 2018-07-01 LAB — SURGICAL PCR SCREEN
MRSA, PCR: POSITIVE — AB
Staphylococcus aureus: POSITIVE — AB

## 2018-07-01 LAB — APTT: aPTT: 30 seconds (ref 24–36)

## 2018-07-01 LAB — PROTIME-INR
INR: 1.04
Prothrombin Time: 13.5 seconds (ref 11.4–15.2)

## 2018-07-01 LAB — HEMOGLOBIN A1C
Hgb A1c MFr Bld: 5.8 % — ABNORMAL HIGH (ref 4.8–5.6)
Mean Plasma Glucose: 119.76 mg/dL

## 2018-07-01 NOTE — Progress Notes (Signed)
PCP - Dr. Dagmar Hait Cardiologist - Dr. Gwenlyn Found  Chest x-ray - n/a EKG - 06/07/2018 Stress Test - 06/16/2018 ECHO - 06/20/2018 Cardiac Cath -   Sleep Study - patient denies CPAP -   Fasting Blood Sugar - 110-120 Checks Blood Sugar 2 times a week  Blood Thinner Instructions: Hold Plavix for 5 days Aspirin Instructions: n/a  Anesthesia review: yes, cardiac history  Patient denies shortness of breath, fever, cough and chest pain at PAT appointment   Patient verbalized understanding of instructions that were given to them at the PAT appointment. Patient was also instructed that they will need to review over the PAT instructions again at home before surgery.

## 2018-07-04 NOTE — Anesthesia Preprocedure Evaluation (Addendum)
Anesthesia Evaluation  Patient identified by MRN, date of birth, ID band Patient awake    Reviewed: Allergy & Precautions, NPO status , Patient's Chart, lab work & pertinent test results  Airway Mallampati: II  TM Distance: >3 FB Neck ROM: Full    Dental no notable dental hx. (+) Loose, Chipped, Dental Advisory Given,    Pulmonary neg pulmonary ROS, former smoker,    Pulmonary exam normal breath sounds clear to auscultation       Cardiovascular hypertension, + Peripheral Vascular Disease  Normal cardiovascular exam Rhythm:Regular Rate:Normal     Neuro/Psych negative neurological ROS  negative psych ROS   GI/Hepatic Neg liver ROS, GERD  ,  Endo/Other  diabetes  Renal/GU negative Renal ROS  negative genitourinary   Musculoskeletal negative musculoskeletal ROS (+)   Abdominal   Peds negative pediatric ROS (+)  Hematology  (+) anemia ,   Anesthesia Other Findings   Reproductive/Obstetrics negative OB ROS                           Anesthesia Physical Anesthesia Plan  ASA: III  Anesthesia Plan: General   Post-op Pain Management:    Induction: Intravenous  PONV Risk Score and Plan: 2 and Ondansetron, Dexamethasone and Treatment may vary due to age or medical condition  Airway Management Planned: Oral ETT  Additional Equipment:   Intra-op Plan:   Post-operative Plan: Extubation in OR  Informed Consent: I have reviewed the patients History and Physical, chart, labs and discussed the procedure including the risks, benefits and alternatives for the proposed anesthesia with the patient or authorized representative who has indicated his/her understanding and acceptance.   Dental advisory given  Plan Discussed with: CRNA and Surgeon  Anesthesia Plan Comments: (See PAT note by Karoline Caldwell, PA-C )       Anesthesia Quick Evaluation

## 2018-07-04 NOTE — Progress Notes (Signed)
Anesthesia Chart Review:  Case:  [id number] Date/Time:  07/06/18 0815   Procedures:      AORTA BIFEMORAL BYPASS GRAFT (N/A )     REVISION RIGHT FEMORAL TO POPLITEAL ARTERY BYPASS GRAFT (Right )   Anesthesia type:  General   Pre-op diagnosis:  PERIPHERAL VASCULAR DISEASE WITH CLAUDICATION   Location:  [facility] OR ROOM 48 / [place] OR   Surgeon:  Serafina Mitchell, MD       DISCUSSION: 78 yo male former smoker. Pertinent hx includes HTN, sinus tachycardia,DMII, hyperlipidemia, Renal insufficiency, PAD (s/pa. left CIA stent>20 years ago. b. h/o LEIA stent and 2 stents to R SFA in 2011. c. 04/2014: s/p PTA of right SFA for in-stent restenosis, occluded left SFA. d. S/p R FPBG 09/18/16. C. redo R femoral endarterectomy on 09/08/2017), anemia, GI bleed, GERD. Former smoker. S/p R shoulder arthroplasty 12/07/16.  Follows with cardiology, Dr. Gwenlyn Found. He was seen 06/07/2018, discussed upcoming surgery. Per his note "I am going to preoperative clinically clear him with a 2D echo and pharmacologic Myoview stress test." Carotid US was also ordered.  Echo 12/23 showed EF 60-65%, moderate basal hypertrophy, normal valves.   Lexiscan 06/16/18 showed primarily fixed medium-sized, mild apical and peri-apical perfusion defect.  Given normal wall motion, possible attenuation artifact.  No significant ischemia. Low risk study.   Carotid US 12/19 showed: Right Carotid: Velocities in the right ICA are consistent with a 60-79% stenosis. Left Carotid: Velocities in the left ICA are consistent with a 1-39% stenosis. Non-hemodynamically significant plaque noted in the CCA. Overall no change from prior study. Dr. Gwenlyn Found said to repeat in 12mo.  Anticipate he can proceed as planned barring acute status change.  VS: BP (!) 108/41 Comment: right arm  Pulse 93   Temp 36.5 C   Ht 5\' 5"  (1.651 m)   Wt 68.4 kg   SpO2 99%   BMI 25.09 kg/m   PROVIDERS: Prince Solian, MD is PCP  Quay Burow, MD is Cardiologist  LABS: Labs  reviewed: Acceptable for surgery. Mild elevated creatinine c/w previous. (all labs ordered are listed, but only abnormal results are displayed)  Labs Reviewed  SURGICAL PCR SCREEN - Abnormal; Notable for the following components:      Result Value   MRSA, PCR POSITIVE (*)    Staphylococcus aureus POSITIVE (*)    All other components within normal limits  GLUCOSE, CAPILLARY - Abnormal; Notable for the following components:   Glucose-Capillary 156 (*)    All other components within normal limits  BLOOD GAS, ARTERIAL - Abnormal; Notable for the following components:   pH, Arterial 7.478 (*)    pO2, Arterial 116 (*)    All other components within normal limits  CBC - Abnormal; Notable for the following components:   RBC 3.66 (*)    Hemoglobin 11.0 (*)    HCT 34.4 (*)    All other components within normal limits  COMPREHENSIVE METABOLIC PANEL - Abnormal; Notable for the following components:   Glucose, Bld 119 (*)    BUN 24 (*)    Creatinine, Ser 1.43 (*)    Total Bilirubin 0.2 (*)    GFR calc non Af Amer 47 (*)    GFR calc Af Amer 55 (*)    All other components within normal limits  URINALYSIS, ROUTINE W REFLEX MICROSCOPIC - Abnormal; Notable for the following components:   Color, Urine STRAW (*)    Specific Gravity, Urine 1.004 (*)    All other components within  normal limits  HEMOGLOBIN A1C - Abnormal; Notable for the following components:   Hgb A1c MFr Bld 5.8 (*)    All other components within normal limits  APTT  PROTIME-INR  TYPE AND SCREEN     IMAGES: CT Angio Chest 02/03/2018: IMPRESSION: Aortic atherosclerosis, with small caliber distal aorta. Aortic Atherosclerosis (ICD10-I70.0).  Multilevel right-sided PAD, including:  -moderate to advanced right iliac atherosclerosis, including occluded hypogastric artery origin, high-grade edge stenosis at patent right EIA stent, and critical stenosis of distal external iliac artery  -the high-grade stenosis of the  distal EIA appears to extend into the proximal common femoral artery  -critical stenosis at the origin of right femoropopliteal bypass, though remains patent  -occluded native right SFA  -3 tibial arteries patent from the origin to the ankle, with short segment developing stenosis of the proximal anterior tibial artery  Multilevel left-sided PAD, including:  -moderate to advanced left iliac disease, including high-grade edge stenosis at the distal EIA stent  -small caliber left common femoral artery  -occlusion of native left SFA, with reconstitution at the abductor canal  -anterior tibial artery and posterior tibial artery patent from the origin to the ankle, with occluded posterior tibial artery  Renal artery disease, including occluded accessory left renal artery resulting in infarction of the inferior left kidney. No evidence of high-grade origin stenosis of the main left or right renal arteries.  Bronchial wall thickening of the bilateral lower lobes with some endobronchial debris on the left. This may reflect acute, chronic, or acute on chronic bronchitis. No evidence of lobar pneumonia  EKG: 06/07/2018: Sinus rhythm with occ PVC. Rate 77. RBBB.   CV: TTE 06/20/2018: Study Conclusions  - Procedure narrative: Transthoracic echocardiography. Image   quality was poor. The study was technically difficult.   Intravenous contrast (Definity) was administered to opacify the   LV. - Left ventricle: The cavity size was normal. There was mild focal   basal hypertrophy of the septum. Systolic function was normal.   The estimated ejection fraction was in the range of 60% to 65%.   Wall motion was normal; there were no regional wall motion   abnormalities. - Aortic valve: Transvalvular velocity was within the normal range.   There was no stenosis. There was no regurgitation. - Mitral valve: Transvalvular velocity was within the normal range.   There was no  evidence for stenosis. There was trivial   regurgitation. - Right ventricle: The cavity size was normal. Wall thickness was   normal. Systolic function was normal. - Atrial septum: No defect or patent foramen ovale was identified. - Tricuspid valve: There was trivial regurgitation. - Pulmonary arteries: Systolic pressure was within the normal   range. PA peak pressure: 34 mm Hg (S).  Lexiscan 06/16/2018:  The left ventricular ejection fraction is normal (55-65%).  Nuclear stress EF: 57%.  There was no ST segment deviation noted during stress.  This is a low risk study.   1. Somewhat difficult study.  2. EF 57%, normal wall motion.  3. Primarily fixed medium-sized, mild apical and peri-apical perfusion defect.  Given normal wall motion, possible attenuation artifact.  No significant ischemia.    Overall, probably low risk study.   Carotid US 06/16/2018: Summary: Right Carotid: Velocities in the right ICA are consistent with a 60-79%                stenosis.  Left Carotid: Velocities in the left ICA are consistent with a 1-39% stenosis.  Non-hemodynamically significant plaque noted in the CCA.  Vertebrals:  Bilateral vertebral arteries demonstrate antegrade flow. Subclavians: Normal flow hemodynamics were seen in bilateral subclavian              arteries.  Past Medical History:  Diagnosis Date  . Anemia   . Complication of anesthesia   . Diabetes mellitus ([place])    TYPE 2  . GERD (gastroesophageal reflux disease)   . GI bleed   . History of blood transfusion    GI bleed  . History of colon polyps   . History of hiatal hernia   . Hyperlipemia   . Hypertension   . Osteoarthritis   . PAD (peripheral artery disease) ([facility])    a. stenting of his left common iliac artery >20 years ago. b. h/o LEIA stent and 2 stents to R SFA in 2011. c. 04/2014:  s/p PTA of right SFA for in-stent restenosis, occluded left SFA  . PONV (postoperative nausea and vomiting)     no porblem with the last 3 surgeries  . RBBB (right bundle branch block with left anterior fascicular block)    NUCLEAR STRESS TEST, 08/18/2010 - no significant wall motion abnoramlities noted, post-stress EF 69%, normal myocardial perfusion study  . Sinus tachycardia    a. Noted during admission 04/2014 but upon review seems to be frequent finding for patient.  . Stenosis of carotid artery    a. 50% right carotid stenosis by angiogram 04/2014.    Past Surgical History:  Procedure Laterality Date  . ABDOMINAL AORTOGRAM W/LOWER EXTREMITY N/A 01/27/2017   Procedure: Abdominal Aortogram w/Lower Extremity;  Surgeon: Serafina Mitchell, MD;  Location: [place] [facility];  Service: Cardiovascular;  Laterality: N/A;  . ABDOMINAL AORTOGRAM W/LOWER EXTREMITY N/A 06/08/2017   Procedure: ABDOMINAL AORTOGRAM W/LOWER EXTREMITY;  Surgeon: Serafina Mitchell, MD;  Location: [place] [facility];  Service: Cardiovascular;  Laterality: N/A;  . ABDOMINAL AORTOGRAM W/LOWER EXTREMITY N/A 08/31/2017   Procedure: ABDOMINAL AORTOGRAM W/LOWER EXTREMITY;  Surgeon: Serafina Mitchell, MD;  Location: [place] [facility];  Service: Cardiovascular;  Laterality: N/A;  rt. unilateral  . ANGIOPLASTY / STENTING FEMORAL    . ANGIOPLASTY / STENTING ILIAC    . CEREBRAL ANGIOGRAM N/A 05/14/2014   Procedure: CEREBRAL ANGIOGRAM;  Surgeon: Lorretta Harp, MD;  Location: [facility] CATH LAB;  Service: Cardiovascular;  Laterality: N/A;  . COLONOSCOPY W/ POLYPECTOMY    . ENDARTERECTOMY Right 09/08/2017  . ENDARTERECTOMY FEMORAL Right 09/08/2017   Procedure: REDO RIGHT FEMORAL ENDARTECTOMY WITH PATCH ANGIOPLASTY.;  Surgeon: Serafina Mitchell, MD;  Location: [place];  Service: Vascular;  Laterality: Right;  . EYE SURGERY Bilateral    cataract  . FEMORAL ARTERY STENT Right 05/12/2010   Stented distally with a 6x100 Abbott absolute stent and proximally with a 6x60 Cook Zilver stent resulting in the reduction of the proximal segment 80% and mid segment 60-70%  to 0% residual, LEFT common femoral artery stented with a 7x3 Smart stent resulting in reduction of 90% stenosis to 0% residual  . FEMORAL-POPLITEAL BYPASS GRAFT Right 09/18/2016   Procedure: BYPASS GRAFT FEMORAL-POPLITEAL ARTERY;  Surgeon: Serafina Mitchell, MD;  Location: [place];  Service: Vascular;  Laterality: Right;  . FEMORAL-POPLITEAL BYPASS GRAFT Right 09/08/2017   Procedure: REDO BYPASS GRAFT FEMORAL-POPLITEAL ARTERY;  Surgeon: Serafina Mitchell, MD;  Location: [place];  Service: Vascular;  Laterality: Right;  . KNEE ARTHROSCOPY     left  . LOWER EXTREMITY ANGIOGRAM N/A 05/14/2014  Procedure: LOWER EXTREMITY ANGIOGRAM;  Surgeon: Lorretta Harp, MD;  Location: [facility] CATH LAB;  Service: Cardiovascular;  Laterality: N/A;  . LOWER EXTREMITY ANGIOGRAPHY N/A 09/21/2016   Procedure: Lower Extremity Angiography;  Surgeon: Conrad [place], MD;  Location: [place] [facility];  Service: Cardiovascular;  Laterality: N/A;  . PERIPHERAL VASCULAR ATHERECTOMY Right 01/27/2017   Procedure: PERIPHERAL VASCULAR ATHERECTOMY;  Surgeon: Serafina Mitchell, MD;  Location: [place] [facility];  Service: Cardiovascular;  Laterality: Right;  . PERIPHERAL VASCULAR BALLOON ANGIOPLASTY Right 06/08/2017   Procedure: PERIPHERAL VASCULAR BALLOON ANGIOPLASTY;  Surgeon: Serafina Mitchell, MD;  Location: [place] [facility];  Service: Cardiovascular;  Laterality: Right;  common femoral and superficial femoral arteries  . PERIPHERAL VASCULAR CATHETERIZATION N/A 04/20/2016   Procedure: Lower Extremity Intervention;  Surgeon: Lorretta Harp, MD;  Location: [place] [facility];  Service: Cardiovascular;  Laterality: N/A;  . PERIPHERAL VASCULAR INTERVENTION  08/31/2017   Procedure: PERIPHERAL VASCULAR INTERVENTION;  Surgeon: Serafina Mitchell, MD;  Location: [place] [facility];  Service: Cardiovascular;;  REIA  . REVERSE SHOULDER ARTHROPLASTY Right 12/07/2016  . REVERSE SHOULDER ARTHROPLASTY Right 12/07/2016   Procedure: REVERSE SHOULDER  ARTHROPLASTY;  Surgeon: Netta Cedars, MD;  Location: [place];  Service: Orthopedics;  Laterality: Right;  . ROTATOR CUFF REPAIR Right 2003  . SFA Right 05/14/2014   PTA  OF RT SFA         DR BERRY    MEDICATIONS: . amoxicillin (AMOXIL) 500 MG capsule  . clopidogrel (PLAVIX) 75 MG tablet  . Coenzyme Q10 (COQ10 PO)  . glucosamine-chondroitin 500-400 MG tablet  . lisinopril (PRINIVIL,ZESTRIL) 5 MG tablet  . metFORMIN (GLUCOPHAGE) 1000 MG tablet  . metoprolol succinate (TOPROL-XL) 25 MG 24 hr tablet  . Omega-3 Fatty Acids (FISH OIL) 1200 MG CAPS  . pantoprazole (PROTONIX) 40 MG tablet  . rosuvastatin (CRESTOR) 20 MG tablet   . 0.9 %  sodium chloride infusion  . 0.9 %  sodium chloride infusion    Christian Bailey [facility] Short Stay Center/Anesthesiology Phone [phone] 07/04/2018 11:21 AM

## 2018-07-05 MED ORDER — VANCOMYCIN HCL IN DEXTROSE 1-5 GM/200ML-% IV SOLN
1000.0000 mg | INTRAVENOUS | Status: AC
  Administered 2018-07-06: 1000 mg via INTRAVENOUS
  Filled 2018-07-05: qty 200

## 2018-07-05 MED ORDER — CEFAZOLIN SODIUM-DEXTROSE 2-4 GM/100ML-% IV SOLN
2.0000 g | INTRAVENOUS | Status: AC
  Administered 2018-07-06 (×2): 2 g via INTRAVENOUS
  Filled 2018-07-05: qty 100

## 2018-07-06 ENCOUNTER — Other Ambulatory Visit: Payer: Self-pay

## 2018-07-06 ENCOUNTER — Inpatient Hospital Stay (HOSPITAL_COMMUNITY): Payer: Medicare Other | Admitting: Certified Registered Nurse Anesthetist

## 2018-07-06 ENCOUNTER — Inpatient Hospital Stay (HOSPITAL_COMMUNITY): Payer: Medicare Other | Admitting: Physician Assistant

## 2018-07-06 ENCOUNTER — Encounter (HOSPITAL_COMMUNITY): Payer: Self-pay | Admitting: Surgery

## 2018-07-06 ENCOUNTER — Inpatient Hospital Stay (HOSPITAL_COMMUNITY): Payer: Medicare Other

## 2018-07-06 ENCOUNTER — Inpatient Hospital Stay (HOSPITAL_COMMUNITY)
Admission: RE | Admit: 2018-07-06 | Discharge: 2018-07-30 | DRG: 271 | Disposition: A | Discharge status: Home Health | Payer: Medicare Other | Attending: Surgery | Admitting: Surgery

## 2018-07-06 ENCOUNTER — Encounter (HOSPITAL_COMMUNITY)
Admission: RE | Disposition: A | Discharge status: Home Health | Payer: Self-pay | Source: Home / Self Care | Attending: Surgery

## 2018-07-06 DIAGNOSIS — I493 Ventricular premature depolarization: Secondary | ICD-10-CM | POA: Diagnosis not present

## 2018-07-06 DIAGNOSIS — Z7984 Long term (current) use of oral hypoglycemic drugs: Secondary | ICD-10-CM

## 2018-07-06 DIAGNOSIS — R008 Other abnormalities of heart beat: Secondary | ICD-10-CM | POA: Diagnosis not present

## 2018-07-06 DIAGNOSIS — G8918 Other acute postprocedural pain: Secondary | ICD-10-CM | POA: Diagnosis not present

## 2018-07-06 DIAGNOSIS — Z888 Allergy status to other drugs, medicaments and biological substances status: Secondary | ICD-10-CM

## 2018-07-06 DIAGNOSIS — N179 Acute kidney failure, unspecified: Secondary | ICD-10-CM | POA: Diagnosis not present

## 2018-07-06 DIAGNOSIS — Z79899 Other long term (current) drug therapy: Secondary | ICD-10-CM

## 2018-07-06 DIAGNOSIS — I70202 Unspecified atherosclerosis of native arteries of extremities, left leg: Secondary | ICD-10-CM | POA: Diagnosis not present

## 2018-07-06 DIAGNOSIS — M1990 Unspecified osteoarthritis, unspecified site: Secondary | ICD-10-CM | POA: Diagnosis present

## 2018-07-06 DIAGNOSIS — I70201 Unspecified atherosclerosis of native arteries of extremities, right leg: Secondary | ICD-10-CM | POA: Diagnosis present

## 2018-07-06 DIAGNOSIS — I471 Supraventricular tachycardia: Secondary | ICD-10-CM | POA: Diagnosis not present

## 2018-07-06 DIAGNOSIS — Z8719 Personal history of other diseases of the digestive system: Secondary | ICD-10-CM

## 2018-07-06 DIAGNOSIS — I4891 Unspecified atrial fibrillation: Secondary | ICD-10-CM | POA: Diagnosis not present

## 2018-07-06 DIAGNOSIS — I739 Peripheral vascular disease, unspecified: Secondary | ICD-10-CM | POA: Diagnosis not present

## 2018-07-06 DIAGNOSIS — E1151 Type 2 diabetes mellitus with diabetic peripheral angiopathy without gangrene: Secondary | ICD-10-CM | POA: Diagnosis present

## 2018-07-06 DIAGNOSIS — Z806 Family history of leukemia: Secondary | ICD-10-CM

## 2018-07-06 DIAGNOSIS — Z95828 Presence of other vascular implants and grafts: Secondary | ICD-10-CM | POA: Diagnosis not present

## 2018-07-06 DIAGNOSIS — J9811 Atelectasis: Secondary | ICD-10-CM | POA: Diagnosis not present

## 2018-07-06 DIAGNOSIS — I4510 Unspecified right bundle-branch block: Secondary | ICD-10-CM | POA: Diagnosis present

## 2018-07-06 DIAGNOSIS — K228 Other specified diseases of esophagus: Secondary | ICD-10-CM | POA: Diagnosis not present

## 2018-07-06 DIAGNOSIS — K810 Acute cholecystitis: Secondary | ICD-10-CM | POA: Diagnosis not present

## 2018-07-06 DIAGNOSIS — I97638 Postprocedural hematoma of a circulatory system organ or structure following other circulatory system procedure: Secondary | ICD-10-CM | POA: Diagnosis not present

## 2018-07-06 DIAGNOSIS — E785 Hyperlipidemia, unspecified: Secondary | ICD-10-CM | POA: Diagnosis present

## 2018-07-06 DIAGNOSIS — Z811 Family history of alcohol abuse and dependence: Secondary | ICD-10-CM

## 2018-07-06 DIAGNOSIS — Z833 Family history of diabetes mellitus: Secondary | ICD-10-CM | POA: Diagnosis not present

## 2018-07-06 DIAGNOSIS — Z0189 Encounter for other specified special examinations: Secondary | ICD-10-CM

## 2018-07-06 DIAGNOSIS — Z886 Allergy status to analgesic agent status: Secondary | ICD-10-CM

## 2018-07-06 DIAGNOSIS — I10 Essential (primary) hypertension: Secondary | ICD-10-CM | POA: Diagnosis not present

## 2018-07-06 DIAGNOSIS — Z8249 Family history of ischemic heart disease and other diseases of the circulatory system: Secondary | ICD-10-CM | POA: Diagnosis not present

## 2018-07-06 DIAGNOSIS — Z4682 Encounter for fitting and adjustment of non-vascular catheter: Secondary | ICD-10-CM | POA: Diagnosis not present

## 2018-07-06 DIAGNOSIS — R1030 Lower abdominal pain, unspecified: Secondary | ICD-10-CM | POA: Diagnosis not present

## 2018-07-06 DIAGNOSIS — Z885 Allergy status to narcotic agent status: Secondary | ICD-10-CM

## 2018-07-06 DIAGNOSIS — N200 Calculus of kidney: Secondary | ICD-10-CM | POA: Diagnosis not present

## 2018-07-06 DIAGNOSIS — Z800 Family history of malignant neoplasm of digestive organs: Secondary | ICD-10-CM

## 2018-07-06 DIAGNOSIS — Z8711 Personal history of peptic ulcer disease: Secondary | ICD-10-CM

## 2018-07-06 DIAGNOSIS — I700 Atherosclerosis of aorta: Secondary | ICD-10-CM | POA: Diagnosis present

## 2018-07-06 DIAGNOSIS — D62 Acute posthemorrhagic anemia: Secondary | ICD-10-CM | POA: Diagnosis not present

## 2018-07-06 DIAGNOSIS — R1011 Right upper quadrant pain: Secondary | ICD-10-CM | POA: Diagnosis not present

## 2018-07-06 DIAGNOSIS — K5730 Diverticulosis of large intestine without perforation or abscess without bleeding: Secondary | ICD-10-CM | POA: Diagnosis not present

## 2018-07-06 DIAGNOSIS — Z87891 Personal history of nicotine dependence: Secondary | ICD-10-CM

## 2018-07-06 DIAGNOSIS — K219 Gastro-esophageal reflux disease without esophagitis: Secondary | ICD-10-CM | POA: Diagnosis present

## 2018-07-06 DIAGNOSIS — Z931 Gastrostomy status: Secondary | ICD-10-CM | POA: Diagnosis not present

## 2018-07-06 DIAGNOSIS — N281 Cyst of kidney, acquired: Secondary | ICD-10-CM | POA: Diagnosis not present

## 2018-07-06 DIAGNOSIS — K567 Ileus, unspecified: Secondary | ICD-10-CM | POA: Diagnosis not present

## 2018-07-06 DIAGNOSIS — Z9889 Other specified postprocedural states: Secondary | ICD-10-CM

## 2018-07-06 DIAGNOSIS — K828 Other specified diseases of gallbladder: Secondary | ICD-10-CM | POA: Diagnosis not present

## 2018-07-06 DIAGNOSIS — Z7902 Long term (current) use of antithrombotics/antiplatelets: Secondary | ICD-10-CM

## 2018-07-06 DIAGNOSIS — K7689 Other specified diseases of liver: Secondary | ICD-10-CM | POA: Diagnosis not present

## 2018-07-06 DIAGNOSIS — R140 Abdominal distension (gaseous): Secondary | ICD-10-CM

## 2018-07-06 DIAGNOSIS — K661 Hemoperitoneum: Secondary | ICD-10-CM | POA: Diagnosis not present

## 2018-07-06 DIAGNOSIS — K812 Acute cholecystitis with chronic cholecystitis: Secondary | ICD-10-CM | POA: Diagnosis not present

## 2018-07-06 DIAGNOSIS — Z4659 Encounter for fitting and adjustment of other gastrointestinal appliance and device: Secondary | ICD-10-CM

## 2018-07-06 DIAGNOSIS — R109 Unspecified abdominal pain: Secondary | ICD-10-CM

## 2018-07-06 DIAGNOSIS — Z96611 Presence of right artificial shoulder joint: Secondary | ICD-10-CM | POA: Diagnosis present

## 2018-07-06 DIAGNOSIS — T82898A Other specified complication of vascular prosthetic devices, implants and grafts, initial encounter: Secondary | ICD-10-CM | POA: Diagnosis not present

## 2018-07-06 HISTORY — PX: AORTA - BILATERAL FEMORAL ARTERY BYPASS GRAFT: SHX1175

## 2018-07-06 HISTORY — PX: FEMORAL-POPLITEAL BYPASS GRAFT: SHX937

## 2018-07-06 LAB — BASIC METABOLIC PANEL
ANION GAP: 8 (ref 5–15)
BUN: 20 mg/dL (ref 8–23)
CO2: 22 mmol/L (ref 22–32)
Calcium: 7.8 mg/dL — ABNORMAL LOW (ref 8.9–10.3)
Chloride: 110 mmol/L (ref 98–111)
Creatinine, Ser: 1.25 mg/dL — ABNORMAL HIGH (ref 0.61–1.24)
GFR calc Af Amer: >60 mL/min (ref >60)
GFR calc non Af Amer: 56 mL/min — ABNORMAL LOW (ref >60)
Glucose, Bld: 217 mg/dL — ABNORMAL HIGH (ref 70–99)
Potassium: 4.4 mmol/L (ref 3.5–5.1)
Sodium: 140 mmol/L (ref 135–145)

## 2018-07-06 LAB — CBC
HCT: 35.9 % — ABNORMAL LOW (ref 39.0–52.0)
Hemoglobin: 11.9 g/dL — ABNORMAL LOW (ref 13.0–17.0)
MCH: 30.4 pg (ref 26.0–34.0)
MCHC: 33.1 g/dL (ref 30.0–36.0)
MCV: 91.6 fL (ref 80.0–100.0)
Platelets: 258 10*3/uL (ref 150–400)
RBC: 3.92 MIL/uL — ABNORMAL LOW (ref 4.22–5.81)
RDW: 13.3 % (ref 11.5–15.5)
WBC: 18.5 10*3/uL — ABNORMAL HIGH (ref 4.0–10.5)
nRBC: 0 % (ref 0.0–0.2)

## 2018-07-06 LAB — GLUCOSE, CAPILLARY
Glucose-Capillary: 121 mg/dL — ABNORMAL HIGH (ref 70–99)
Glucose-Capillary: 166 mg/dL — ABNORMAL HIGH (ref 70–99)
Glucose-Capillary: 204 mg/dL — ABNORMAL HIGH (ref 70–99)
Glucose-Capillary: 217 mg/dL — ABNORMAL HIGH (ref 70–99)

## 2018-07-06 LAB — MAGNESIUM: Magnesium: 1.3 mg/dL — ABNORMAL LOW (ref 1.7–2.4)

## 2018-07-06 LAB — PROTIME-INR
INR: 1.25
Prothrombin Time: 15.5 seconds — ABNORMAL HIGH (ref 11.4–15.2)

## 2018-07-06 LAB — PREPARE RBC (CROSSMATCH)

## 2018-07-06 LAB — APTT: aPTT: 27 seconds (ref 24–36)

## 2018-07-06 SURGERY — CREATION, BYPASS, ARTERIAL, AORTA TO FEMORAL, BILATERAL, USING GRAFT
Anesthesia: General | Site: Groin | Laterality: Right

## 2018-07-06 MED ORDER — ROSUVASTATIN CALCIUM 20 MG PO TABS
20.0000 mg | ORAL_TABLET | Freq: Every evening | ORAL | Status: DC
  Administered 2018-07-07 – 2018-07-29 (×20): 20 mg via ORAL
  Filled 2018-07-06 (×20): qty 1

## 2018-07-06 MED ORDER — LACTATED RINGERS IV SOLN
INTRAVENOUS | Status: DC | PRN
  Administered 2018-07-06 (×2): via INTRAVENOUS

## 2018-07-06 MED ORDER — POTASSIUM CHLORIDE CRYS ER 20 MEQ PO TBCR: 20.0000 meq | EXTENDED_RELEASE_TABLET | Freq: Every day | ORAL | Status: DC | PRN

## 2018-07-06 MED ORDER — PHENOL 1.4 % MT LIQD: 1.0000 | OROMUCOSAL | Status: DC | PRN

## 2018-07-06 MED ORDER — SODIUM CHLORIDE 0.9% IV SOLUTION: Freq: Once | INTRAVENOUS | Status: DC

## 2018-07-06 MED ORDER — BISACODYL 10 MG RE SUPP
10.0000 mg | Freq: Every day | RECTAL | Status: DC | PRN
  Administered 2018-07-09 – 2018-07-15 (×3): 10 mg via RECTAL
  Filled 2018-07-06 (×5): qty 1

## 2018-07-06 MED ORDER — SODIUM CHLORIDE 0.9 % IV SOLN: 500.0000 mL | Freq: Once | INTRAVENOUS | Status: DC | PRN

## 2018-07-06 MED ORDER — 0.9 % SODIUM CHLORIDE (POUR BTL) OPTIME
TOPICAL | Status: DC | PRN
  Administered 2018-07-06: 3000 mL

## 2018-07-06 MED ORDER — MIDAZOLAM HCL 2 MG/2ML IJ SOLN
INTRAMUSCULAR | Status: AC
  Filled 2018-07-06: qty 2

## 2018-07-06 MED ORDER — SUGAMMADEX SODIUM 200 MG/2ML IV SOLN
INTRAVENOUS | Status: DC | PRN
  Administered 2018-07-06: 150 mg via INTRAVENOUS

## 2018-07-06 MED ORDER — SODIUM CHLORIDE 0.9 % IV SOLN
INTRAVENOUS | Status: AC
  Filled 2018-07-06: qty 1.2

## 2018-07-06 MED ORDER — HYDROMORPHONE HCL 1 MG/ML IJ SOLN
0.5000 mg | INTRAMUSCULAR | Status: DC | PRN
  Administered 2018-07-07: 0.5 mg via INTRAVENOUS
  Administered 2018-07-07 (×2): 1 mg via INTRAVENOUS
  Administered 2018-07-07 (×2): 0.5 mg via INTRAVENOUS
  Administered 2018-07-07 – 2018-07-09 (×6): 1 mg via INTRAVENOUS
  Administered 2018-07-09: 0.5 mg via INTRAVENOUS
  Administered 2018-07-09 – 2018-07-10 (×4): 1 mg via INTRAVENOUS
  Administered 2018-07-10: 0.5 mg via INTRAVENOUS
  Filled 2018-07-06 (×2): qty 1
  Filled 2018-07-06 (×2): qty 0.5
  Filled 2018-07-06 (×5): qty 1
  Filled 2018-07-06: qty 0.5
  Filled 2018-07-06 (×7): qty 1

## 2018-07-06 MED ORDER — ACETAMINOPHEN 325 MG PO TABS
325.0000 mg | ORAL_TABLET | ORAL | Status: DC | PRN
  Administered 2018-07-10 – 2018-07-12 (×2): 650 mg via ORAL
  Filled 2018-07-06 (×3): qty 2

## 2018-07-06 MED ORDER — HYDRALAZINE HCL 20 MG/ML IJ SOLN
5.0000 mg | INTRAMUSCULAR | Status: AC | PRN
  Administered 2018-07-19 – 2018-07-21 (×2): 5 mg via INTRAVENOUS
  Filled 2018-07-06 (×2): qty 1

## 2018-07-06 MED ORDER — PROTAMINE SULFATE 10 MG/ML IV SOLN
INTRAVENOUS | Status: DC | PRN
  Administered 2018-07-06 (×2): 10 mg via INTRAVENOUS
  Administered 2018-07-06: 20 mg via INTRAVENOUS
  Administered 2018-07-06: 10 mg via INTRAVENOUS

## 2018-07-06 MED ORDER — LABETALOL HCL 5 MG/ML IV SOLN
INTRAVENOUS | Status: AC
  Filled 2018-07-06: qty 4

## 2018-07-06 MED ORDER — SODIUM CHLORIDE 0.9 % IV SOLN: INTRAVENOUS | Status: DC

## 2018-07-06 MED ORDER — CHLORHEXIDINE GLUCONATE 4 % EX LIQD: 60.0000 mL | Freq: Once | CUTANEOUS | Status: DC

## 2018-07-06 MED ORDER — ALUM & MAG HYDROXIDE-SIMETH 200-200-20 MG/5ML PO SUSP: 15.0000 mL | ORAL | Status: DC | PRN

## 2018-07-06 MED ORDER — SUCCINYLCHOLINE CHLORIDE 20 MG/ML IJ SOLN
INTRAMUSCULAR | Status: DC | PRN
  Administered 2018-07-06: 140 mg via INTRAVENOUS

## 2018-07-06 MED ORDER — HEPARIN SODIUM (PORCINE) 5000 UNIT/ML IJ SOLN
5000.0000 [IU] | Freq: Three times a day (TID) | INTRAMUSCULAR | Status: DC
  Administered 2018-07-06 – 2018-07-19 (×39): 5000 [IU] via SUBCUTANEOUS
  Filled 2018-07-06 (×36): qty 1

## 2018-07-06 MED ORDER — PANTOPRAZOLE SODIUM 40 MG PO TBEC
40.0000 mg | DELAYED_RELEASE_TABLET | Freq: Every day | ORAL | Status: DC
  Administered 2018-07-06: 40 mg via ORAL
  Filled 2018-07-06: qty 1

## 2018-07-06 MED ORDER — LIDOCAINE HCL (CARDIAC) PF 100 MG/5ML IV SOSY
PREFILLED_SYRINGE | INTRAVENOUS | Status: DC | PRN
  Administered 2018-07-06: 80 mg via INTRAVENOUS

## 2018-07-06 MED ORDER — ACETAMINOPHEN 650 MG RE SUPP: 325.0000 mg | RECTAL | Status: DC | PRN

## 2018-07-06 MED ORDER — PHENYLEPHRINE 40 MCG/ML (10ML) SYRINGE FOR IV PUSH (FOR BLOOD PRESSURE SUPPORT)
PREFILLED_SYRINGE | INTRAVENOUS | Status: DC | PRN
  Administered 2018-07-06: 80 ug via INTRAVENOUS
  Administered 2018-07-06: 40 ug via INTRAVENOUS
  Administered 2018-07-06: 80 ug via INTRAVENOUS

## 2018-07-06 MED ORDER — METOPROLOL TARTRATE 5 MG/5ML IV SOLN
2.0000 mg | INTRAVENOUS | Status: AC | PRN
  Administered 2018-07-09 (×2): 5 mg via INTRAVENOUS
  Filled 2018-07-06 (×3): qty 5

## 2018-07-06 MED ORDER — ROCURONIUM BROMIDE 100 MG/10ML IV SOLN
INTRAVENOUS | Status: DC | PRN
  Administered 2018-07-06 (×2): 20 mg via INTRAVENOUS
  Administered 2018-07-06: 50 mg via INTRAVENOUS
  Administered 2018-07-06: 20 mg via INTRAVENOUS
  Administered 2018-07-06 (×2): 30 mg via INTRAVENOUS

## 2018-07-06 MED ORDER — ALBUMIN HUMAN 5 % IV SOLN
INTRAVENOUS | Status: DC | PRN
  Administered 2018-07-06 (×3): via INTRAVENOUS

## 2018-07-06 MED ORDER — PHENYLEPHRINE HCL 10 MG/ML IJ SOLN
INTRAMUSCULAR | Status: DC | PRN
  Administered 2018-07-06: 80 ug via INTRAVENOUS

## 2018-07-06 MED ORDER — ONDANSETRON HCL 4 MG/2ML IJ SOLN
4.0000 mg | Freq: Four times a day (QID) | INTRAMUSCULAR | Status: DC | PRN
  Administered 2018-07-07 – 2018-07-16 (×11): 4 mg via INTRAVENOUS
  Filled 2018-07-06 (×11): qty 2

## 2018-07-06 MED ORDER — SODIUM CHLORIDE 0.9 % IV SOLN
INTRAVENOUS | Status: DC | PRN
  Administered 2018-07-06: 35 ug/min via INTRAVENOUS

## 2018-07-06 MED ORDER — LABETALOL HCL 5 MG/ML IV SOLN
10.0000 mg | INTRAVENOUS | Status: AC | PRN
  Administered 2018-07-06 – 2018-07-11 (×4): 10 mg via INTRAVENOUS
  Filled 2018-07-06 (×5): qty 4

## 2018-07-06 MED ORDER — EPHEDRINE SULFATE 50 MG/ML IJ SOLN
INTRAMUSCULAR | Status: DC | PRN
  Administered 2018-07-06 (×3): 5 mg via INTRAVENOUS

## 2018-07-06 MED ORDER — LACTATED RINGERS IV SOLN
INTRAVENOUS | Status: DC | PRN
  Administered 2018-07-06 (×2): via INTRAVENOUS

## 2018-07-06 MED ORDER — FENTANYL CITRATE (PF) 250 MCG/5ML IJ SOLN
INTRAMUSCULAR | Status: AC
  Filled 2018-07-06: qty 5

## 2018-07-06 MED ORDER — HYDROMORPHONE HCL 1 MG/ML IJ SOLN
INTRAMUSCULAR | Status: AC
  Administered 2018-07-07: 0.5 mg via INTRAVENOUS
  Filled 2018-07-06: qty 1

## 2018-07-06 MED ORDER — INSULIN ASPART 100 UNIT/ML ~~LOC~~ SOLN
0.0000 [IU] | SUBCUTANEOUS | Status: DC
  Administered 2018-07-06 – 2018-07-07 (×3): 3 [IU] via SUBCUTANEOUS
  Administered 2018-07-07: 2 [IU] via SUBCUTANEOUS
  Administered 2018-07-07: 3 [IU] via SUBCUTANEOUS
  Administered 2018-07-07: 1 [IU] via SUBCUTANEOUS
  Administered 2018-07-07: 3 [IU] via SUBCUTANEOUS
  Administered 2018-07-08: 2 [IU] via SUBCUTANEOUS
  Administered 2018-07-08 – 2018-07-10 (×7): 1 [IU] via SUBCUTANEOUS
  Administered 2018-07-10: 3 [IU] via SUBCUTANEOUS
  Administered 2018-07-10 – 2018-07-11 (×3): 1 [IU] via SUBCUTANEOUS
  Administered 2018-07-11: 2 [IU] via SUBCUTANEOUS
  Administered 2018-07-11 – 2018-07-12 (×3): 1 [IU] via SUBCUTANEOUS
  Administered 2018-07-12: 2 [IU] via SUBCUTANEOUS
  Administered 2018-07-12 (×2): 1 [IU] via SUBCUTANEOUS
  Administered 2018-07-12: 2 [IU] via SUBCUTANEOUS
  Administered 2018-07-13: 1 [IU] via SUBCUTANEOUS
  Administered 2018-07-13 (×2): 2 [IU] via SUBCUTANEOUS
  Administered 2018-07-13: 1 [IU] via SUBCUTANEOUS
  Administered 2018-07-14: 2 [IU] via SUBCUTANEOUS
  Administered 2018-07-14 – 2018-07-15 (×4): 1 [IU] via SUBCUTANEOUS
  Administered 2018-07-15: 2 [IU] via SUBCUTANEOUS
  Administered 2018-07-15: 1 [IU] via SUBCUTANEOUS
  Administered 2018-07-15 – 2018-07-16 (×2): 2 [IU] via SUBCUTANEOUS

## 2018-07-06 MED ORDER — PROPOFOL 10 MG/ML IV BOLUS
INTRAVENOUS | Status: DC | PRN
  Administered 2018-07-06: 20 mg via INTRAVENOUS
  Administered 2018-07-06: 120 mg via INTRAVENOUS
  Administered 2018-07-06: 50 mg via INTRAVENOUS

## 2018-07-06 MED ORDER — DOCUSATE SODIUM 100 MG PO CAPS: 100.0000 mg | ORAL_CAPSULE | Freq: Every day | ORAL | Status: DC

## 2018-07-06 MED ORDER — HYDROMORPHONE HCL 1 MG/ML IJ SOLN
0.2500 mg | INTRAMUSCULAR | Status: DC | PRN
  Administered 2018-07-06 (×2): 0.5 mg via INTRAVENOUS

## 2018-07-06 MED ORDER — MAGNESIUM SULFATE 2 GM/50ML IV SOLN: 2.0000 g | Freq: Every day | INTRAVENOUS | Status: DC | PRN

## 2018-07-06 MED ORDER — ORAL CARE MOUTH RINSE
15.0000 mL | Freq: Two times a day (BID) | OROMUCOSAL | Status: DC
  Administered 2018-07-07 – 2018-07-29 (×42): 15 mL via OROMUCOSAL

## 2018-07-06 MED ORDER — CEFAZOLIN SODIUM-DEXTROSE 2-4 GM/100ML-% IV SOLN
2.0000 g | Freq: Three times a day (TID) | INTRAVENOUS | Status: AC
  Administered 2018-07-06 – 2018-07-07 (×2): 2 g via INTRAVENOUS
  Filled 2018-07-06 (×2): qty 100

## 2018-07-06 MED ORDER — ONDANSETRON HCL 4 MG/2ML IJ SOLN
INTRAMUSCULAR | Status: DC | PRN
  Administered 2018-07-06: 4 mg via INTRAVENOUS

## 2018-07-06 MED ORDER — MIDAZOLAM HCL 5 MG/5ML IJ SOLN
INTRAMUSCULAR | Status: DC | PRN
  Administered 2018-07-06 (×2): 1 mg via INTRAVENOUS

## 2018-07-06 MED ORDER — NITROGLYCERIN IN D5W 200-5 MCG/ML-% IV SOLN
INTRAVENOUS | Status: DC | PRN
  Administered 2018-07-06: 15 ug/min via INTRAVENOUS

## 2018-07-06 MED ORDER — HEPARIN SODIUM (PORCINE) 1000 UNIT/ML IJ SOLN
INTRAMUSCULAR | Status: DC | PRN
  Administered 2018-07-06: 7000 [IU] via INTRAVENOUS
  Administered 2018-07-06: 1000 [IU] via INTRAVENOUS
  Administered 2018-07-06: 2000 [IU] via INTRAVENOUS

## 2018-07-06 MED ORDER — FENTANYL CITRATE (PF) 250 MCG/5ML IJ SOLN
INTRAMUSCULAR | Status: DC | PRN
  Administered 2018-07-06: 100 ug via INTRAVENOUS
  Administered 2018-07-06 (×2): 50 ug via INTRAVENOUS
  Administered 2018-07-06: 100 ug via INTRAVENOUS
  Administered 2018-07-06 (×5): 50 ug via INTRAVENOUS

## 2018-07-06 MED ORDER — KCL IN DEXTROSE-NACL 20-5-0.45 MEQ/L-%-% IV SOLN
INTRAVENOUS | Status: DC
  Administered 2018-07-06 – 2018-07-07 (×2): via INTRAVENOUS
  Administered 2018-07-07: 1000 mL via INTRAVENOUS
  Administered 2018-07-08: 06:00:00 via INTRAVENOUS
  Administered 2018-07-09: 50 mL/h via INTRAVENOUS
  Administered 2018-07-09 – 2018-07-15 (×7): via INTRAVENOUS
  Filled 2018-07-06 (×14): qty 1000

## 2018-07-06 MED ORDER — GUAIFENESIN-DM 100-10 MG/5ML PO SYRP: 15.0000 mL | ORAL_SOLUTION | ORAL | Status: DC | PRN

## 2018-07-06 MED ORDER — PROPOFOL 10 MG/ML IV BOLUS
INTRAVENOUS | Status: AC
  Filled 2018-07-06: qty 40

## 2018-07-06 MED ORDER — HEMOSTATIC AGENTS (NO CHARGE) OPTIME
TOPICAL | Status: DC | PRN
  Administered 2018-07-06 (×2): 1 via TOPICAL

## 2018-07-06 MED ORDER — HYDROMORPHONE HCL 1 MG/ML IJ SOLN
INTRAMUSCULAR | Status: AC
  Filled 2018-07-06: qty 1

## 2018-07-06 MED ORDER — PROMETHAZINE HCL 25 MG/ML IJ SOLN: 6.2500 mg | INTRAMUSCULAR | Status: DC | PRN

## 2018-07-06 MED ORDER — SODIUM CHLORIDE 0.9 % IV SOLN
INTRAVENOUS | Status: DC | PRN
  Administered 2018-07-06: 10:00:00

## 2018-07-06 MED ORDER — METOPROLOL TARTRATE 5 MG/5ML IV SOLN
INTRAVENOUS | Status: DC | PRN
  Administered 2018-07-06: 1 mg via INTRAVENOUS
  Administered 2018-07-06: 2 mg via INTRAVENOUS
  Administered 2018-07-06 (×2): 1 mg via INTRAVENOUS

## 2018-07-06 SURGICAL SUPPLY — 96 items
BAG ISOLATION DRAPE 18X18 (DRAPES) IMPLANT
BANDAGE ESMARK 6X9 LF (GAUZE/BANDAGES/DRESSINGS) IMPLANT
BNDG ESMARK 6X9 LF (GAUZE/BANDAGES/DRESSINGS)
CANISTER SUCT 3000ML PPV (MISCELLANEOUS) ×4 IMPLANT
CANISTER WOUND CARE 500ML ATS (WOUND CARE) ×2 IMPLANT
CANNULA VESSEL 3MM 2 BLNT TIP (CANNULA) ×4 IMPLANT
CATH EMB 4FR 40CM (CATHETERS) ×2 IMPLANT
CLIP VESOCCLUDE MED 24/CT (CLIP) ×4 IMPLANT
CLIP VESOCCLUDE SM WIDE 24/CT (CLIP) ×4 IMPLANT
CONNECTOR Y ATS VAC SYSTEM (MISCELLANEOUS) ×2 IMPLANT
COVER MAYO STAND STRL (DRAPES) ×4 IMPLANT
COVER WAND RF STERILE (DRAPES) ×4 IMPLANT
CUFF TOURNIQUET SINGLE 24IN (TOURNIQUET CUFF) IMPLANT
CUFF TOURNIQUET SINGLE 34IN LL (TOURNIQUET CUFF) IMPLANT
CUFF TOURNIQUET SINGLE 44IN (TOURNIQUET CUFF) IMPLANT
DERMABOND ADVANCED (GAUZE/BANDAGES/DRESSINGS) ×4
DERMABOND ADVANCED .7 DNX12 (GAUZE/BANDAGES/DRESSINGS) ×4 IMPLANT
DRAIN CHANNEL 15F RND FF W/TCR (WOUND CARE) IMPLANT
DRAPE HALF SHEET 40X57 (DRAPES) IMPLANT
DRAPE ISOLATION BAG 18X18 (DRAPES) ×4
DRAPE X-RAY CASS 24X20 (DRAPES) IMPLANT
ELECT BLADE 4.0 EZ CLEAN MEGAD (MISCELLANEOUS) ×4
ELECT BLADE 6.5 EXT (BLADE) IMPLANT
ELECT CAUTERY BLADE 6.4 (BLADE) ×2 IMPLANT
ELECT REM PT RETURN 9FT ADLT (ELECTROSURGICAL) ×4
ELECTRODE BLDE 4.0 EZ CLN MEGD (MISCELLANEOUS) ×2 IMPLANT
ELECTRODE REM PT RTRN 9FT ADLT (ELECTROSURGICAL) ×2 IMPLANT
EVACUATOR SILICONE 100CC (DRAIN) IMPLANT
FELT TEFLON 1X6 (MISCELLANEOUS) ×2 IMPLANT
GLOVE BIO SURGEON STRL SZ 6.5 (GLOVE) ×5 IMPLANT
GLOVE BIO SURGEONS STRL SZ 6.5 (GLOVE) ×5
GLOVE BIOGEL PI IND STRL 6.5 (GLOVE) IMPLANT
GLOVE BIOGEL PI IND STRL 7.0 (GLOVE) IMPLANT
GLOVE BIOGEL PI IND STRL 7.5 (GLOVE) ×2 IMPLANT
GLOVE BIOGEL PI INDICATOR 6.5 (GLOVE) ×4
GLOVE BIOGEL PI INDICATOR 7.0 (GLOVE) ×2
GLOVE BIOGEL PI INDICATOR 7.5 (GLOVE) ×6
GLOVE ECLIPSE 7.0 STRL STRAW (GLOVE) ×4 IMPLANT
GLOVE ECLIPSE 7.5 STRL STRAW (GLOVE) ×2 IMPLANT
GLOVE SURG SS PI 7.0 STRL IVOR (GLOVE) ×2 IMPLANT
GLOVE SURG SS PI 7.5 STRL IVOR (GLOVE) ×4 IMPLANT
GOWN STRL REUS W/ TWL LRG LVL3 (GOWN DISPOSABLE) ×4 IMPLANT
GOWN STRL REUS W/ TWL XL LVL3 (GOWN DISPOSABLE) ×2 IMPLANT
GOWN STRL REUS W/TWL LRG LVL3 (GOWN DISPOSABLE) ×8
GOWN STRL REUS W/TWL XL LVL3 (GOWN DISPOSABLE) ×6
GRAFT HEMASHIELD 14X7MM (Vascular Products) ×2 IMPLANT
GRAFT PROPATEN THIN WALL 6X40 (Vascular Products) ×2 IMPLANT
HEMOSTAT SNOW SURGICEL 2X4 (HEMOSTASIS) ×4 IMPLANT
INSERT FOGARTY 61MM (MISCELLANEOUS) ×6 IMPLANT
INSERT FOGARTY SM (MISCELLANEOUS) ×12 IMPLANT
KIT BASIN OR (CUSTOM PROCEDURE TRAY) ×4 IMPLANT
KIT DRSG PREVENA PLUS 7DAY 125 (MISCELLANEOUS) ×2 IMPLANT
KIT PREVENA INCISION MGT 13 (CANNISTER) ×4 IMPLANT
KIT TURNOVER KIT B (KITS) ×4 IMPLANT
MARKER GRAFT CORONARY BYPASS (MISCELLANEOUS) IMPLANT
NS IRRIG 1000ML POUR BTL (IV SOLUTION) ×8 IMPLANT
PACK AORTA (CUSTOM PROCEDURE TRAY) ×4 IMPLANT
PACK PERIPHERAL VASCULAR (CUSTOM PROCEDURE TRAY) ×4 IMPLANT
PAD ARMBOARD 7.5X6 YLW CONV (MISCELLANEOUS) ×8 IMPLANT
PENCIL BUTTON HOLSTER BLD 10FT (ELECTRODE) ×2 IMPLANT
SET COLLECT BLD 21X3/4 12 (NEEDLE) IMPLANT
STOPCOCK 4 WAY LG BORE MALE ST (IV SETS) ×2 IMPLANT
SUT ETHIBOND 5 LR DA (SUTURE) IMPLANT
SUT ETHILON 3 0 PS 1 (SUTURE) IMPLANT
SUT PDS AB 1 TP1 54 (SUTURE) ×8 IMPLANT
SUT PROLENE 3 0 SH 48 (SUTURE) ×14 IMPLANT
SUT PROLENE 3 0 SH DA (SUTURE) IMPLANT
SUT PROLENE 4 0 RB 1 (SUTURE) ×8
SUT PROLENE 4-0 RB1 .5 CRCL 36 (SUTURE) IMPLANT
SUT PROLENE 4-0 RB1 18X2 ARM (SUTURE) IMPLANT
SUT PROLENE 5 0 C 1 24 (SUTURE) ×20 IMPLANT
SUT PROLENE 5 0 C 1 36 (SUTURE) IMPLANT
SUT PROLENE 6 0 BV (SUTURE) ×8 IMPLANT
SUT SILK 2 0 (SUTURE) ×2
SUT SILK 2 0 SH (SUTURE) ×4 IMPLANT
SUT SILK 2 0 TIES 17X18 (SUTURE)
SUT SILK 2 0SH CR/8 30 (SUTURE) ×4 IMPLANT
SUT SILK 2-0 18XBRD TIE 12 (SUTURE) ×2 IMPLANT
SUT SILK 2-0 18XBRD TIE BLK (SUTURE) ×2 IMPLANT
SUT SILK 3 0 (SUTURE) ×2
SUT SILK 3 0 TIES 17X18 (SUTURE)
SUT SILK 3-0 18XBRD TIE 12 (SUTURE) ×2 IMPLANT
SUT SILK 3-0 18XBRD TIE BLK (SUTURE) ×2 IMPLANT
SUT VIC AB 2-0 CT1 27 (SUTURE) ×10
SUT VIC AB 2-0 CT1 TAPERPNT 27 (SUTURE) ×10 IMPLANT
SUT VIC AB 3-0 SH 27 (SUTURE) ×8
SUT VIC AB 3-0 SH 27X BRD (SUTURE) ×4 IMPLANT
SUT VICRYL 4-0 PS2 18IN ABS (SUTURE) ×16 IMPLANT
SYR 3ML LL SCALE MARK (SYRINGE) ×2 IMPLANT
TAPE UMBILICAL COTTON 1/8X30 (MISCELLANEOUS) ×2 IMPLANT
TOWEL GREEN STERILE (TOWEL DISPOSABLE) ×4 IMPLANT
TOWEL SURG RFD BLUE STRL DISP (DISPOSABLE) ×8 IMPLANT
TRAY FOLEY MTR SLVR 16FR STAT (SET/KITS/TRAYS/PACK) ×4 IMPLANT
TUBING EXTENTION W/L.L. (IV SETS) IMPLANT
UNDERPAD 30X30 (UNDERPADS AND DIAPERS) ×4 IMPLANT
WATER STERILE IRR 1000ML POUR (IV SOLUTION) ×8 IMPLANT

## 2018-07-06 NOTE — Op Note (Signed)
Patient name: Bradin Mcadory MRN: [id number] DOB: Aug 25, 1940 Sex: male  07/06/2018 Pre-operative Diagnosis: right > left claudication Post-operative diagnosis:  Same Surgeon:  [name] Assistants: Laurence Slate, Aldona Bar Ryne Procedure:   #1: Aortobifemoral bypass graft (14 x 7)   #2: Redo right femoral artery exposure   #3: Revision of right femoral-popliteal bypass graft using a 6 mm Gore-Tex interposition graft   #4: Placement of bilateral groin Praveena wound VAC Anesthesia: General Blood Loss: 1400 cc.  2 units of blood and Cell Saver were returned. Specimens: None  Findings: A end to end anastomosis was performed in the aorta.  A endarterectomy was performed in the left groin and the anastomosis was to the common femoral artery extending down onto the profundofemoral artery.  There was dense scar tissue in the right groin.  No obvious profundofemoral artery was present.  There were 2 small profunda branches that were preserved.  The right limb of the graft was brought down onto the common femoral artery over top of these 2 profunda branches.  I completely disconnected the vein femoral-popliteal bypass graft.  The previous superficial femoral artery interposition graft was excised.  I placed a interposition 6 mm Gore-Tex graft from the anterior surface of the right limb of the graft down end to end to the vein graft.  Indications: The patient has had multiple percutaneous revascularization procedures done for claudication.  He never received great relief of his symptoms.  He ultimately ended up having a right femoral-popliteal bypass graft with vein.  He had persistent narrowing of the proximal portion of the graft and ultimately underwent revision with a interposition endarterectomized superficial femoral artery.  This also had significant narrowing.  He continued to have significant symptoms.  Ultimately I felt that the majority of his symptoms were related to inflow disease given the  hypoplastic appearance of his iliac vessels and stenosis in his distal aorta.  I suggested aortobifemoral bypass graft.  Because the patient is severely limited by his symptoms he wished to proceed.  Procedure:  The patient was identified in the holding area and taken to [place] [facility]  The patient was then placed supine on the table. general anesthesia was administered.  The patient was prepped and draped in the usual sterile fashion.  A time out was called and antibiotics were administered.  Longitudinal incisions were made in both groins.  On the left I dissected out the common femoral artery from the inguinal ligament down to the bifurcation and then individually isolated the superficial femoral artery as well as 3 profundofemoral branches.  Crossing circumflex veins were divided under the inguinal ligament.  In the right groin, there was extensive scar tissue which made it very difficult to get adequate exposure.  Ultimately I was able to mobilize the bypass graft and then traced this up to the common femoral artery.  I did not identify a obvious profunda trunk which was consistent with his arteriogram.  He did have 2 small approximately 2 mm profunda branches which were preserved.  The scar tissue was so dense it was difficult to dissect out the inguinal ligament but ultimately I was able to do so.  Once a had the groins exposed, attention was turned towards the abdomen.  The majority of the blood loss from the procedure was encountered during exposure of the right femoral vessels.  A midline incision was made from the xiphoid down below the umbilicus.  Cautery was used to divide subcutaneous tissue.  The  fascia was then opened with cautery.  The peritoneal cavity was entered sharply and opened throughout the length of the incision.  The abdomen was inspected and no gross pathology was identified.  A Balfour was placed followed by the Omni-Tract retractor.  The NG tube was confirmed to be in the stomach.   The transverse colon was then reflected cephalad and the small bowel was mobilized to the patient's right.  The ligament of Treitz was taken down with sharp dissection and cautery.  I then exposed the abdominal aorta.  The inferior mesenteric vein and the renal vein were preserved.  The aorta was then circumferentially dissected free at the level of the renal arteries.  I then individually isolated the inferior mesenteric artery and encircled it with a red vessel loop.  Both iliac arteries were then dissected free.  I then created a tunnel from the abdomen into each groin and passed an umbilical tape posterior to the ureter.  At this point, the patient was fully heparinized.  After the heparin circulated, a Zanger clamp was used to occlude the infrarenal abdominal aorta and a Harken clamp was used to occlude the aorta just below the renal arteries.  The aorta was then transected.  The distal end of the aorta was oversewn with 2 layers of 3-0 Prolene.  I then performed a endarterectomy of the proximal infrarenal abdominal aorta.  I selected a 14 x 7 graft.  A end to end anastomosis was then created with 6-0 Prolene incorporating a felt strip.  Once this was completed Fogarty clamps were placed on both limbs of the graft and the arcing clamp was removed.  The anastomosis was hemostatic.  Both limbs of the graft were then brought through the previously created tunnel.  Attention was turned first towards the left groin.  The common femoral artery was occluded with a Henley clamp and Vesseloops were used to occlude the profundofemoral artery.  The superficial femoral artery was chronically occluded.  A #11 blade was used to make an arteriotomy in the distal common femoral artery which was extended longitudinally with Potts scissors down onto the origin of the profundofemoral artery I then performed a endarterectomy of the common femoral artery and made sure I had a good distal endpoint in the profundofemoral.  The graft  was pulled the appropriate length.  A running anastomosis was created in a end-to-side fashion with running 5-0 Prolene.  Prior to completion, the appropriate flushing maneuvers were performed and blood flow was reestablished to the left leg.  Attention was then turned towards the right groin.  I first began by transecting the femoral-popliteal bypass graft and removing the interposition superficial femoral artery graft.  A limited endarterectomy of the distal common femoral artery was obtained.  There was good backbleeding from the 2 small profunda branches.  There was meager inflow from the iliac vessels.  I did pass the Fogarty catheter proximally and did remove some thrombus which help with the inflow but it was still very poor.  The graft was then cut to the appropriate length.  A end-to-side anastomosis was then performed using running 5-0 Prolene.  Once this was done the clamps were released and flow to the right leg was established.  I then inspected the remnant of the femoral-popliteal vein graft.  There was a good lumen and excellent backbleeding.  I selected a 6 mm heparin-bonded Gore-Tex graft.  I occluded the right limb of the aortobifemoral graft with a Cooley J clamp and made  a graftotomy with a #11 blade.  An ellipse was created with tenotomy scissors.  A end-to-side anastomosis on the anterior surface of the aortobifemoral limb was performed with running 5-0 Prolene.  Clamps were then released and there was excellent flow through the Gore-Tex graft.  I then spatulated the Gore-Tex graft and the vein graft and performed a end to end anastomosis with running 6-0 Prolene.  Prior to completion the appropriate flushing maneuvers were performed and the anastomosis was then completed.  Hand-held Doppler was then used to evaluate the right leg.  There was a multiphasic anterior tibial Doppler signal.  There was a slightly less prominent Doppler signal in the anterior tibial artery on the left.  There was  a biphasic popliteal signal in both sides had brisk signals in the profundofemoral artery.  At this point I was satisfied with these results.  50 mg of protamine was then administered.  Attention was then turned back towards the abdomen.  The abdomen was copiously irrigated.  Hemostasis was achieved.  The retroperitoneal tissue was then reapproximated with a running 2-0 Vicryl.  The retractors were then removed.  The small bowel was placed back into its anatomic position.  I ran the bowel and there were no defects.  The colon was then placed back into its anatomic position.  The abdomen was then closed with 2 running #1 PDS suture.  The subcutaneous tissue was closed with 2-0 Vicryl and the skin was closed with 4-0 Vicryl.  Next, both groins were closed with multiple layers of 2-0 and 3-0 Vicryl, followed by 4-0 Vicryl on the skin.  Praveena wound vacs were then placed in both groin incisions and Dermabond was used on the abdomen.  The patient tolerated the procedure well was extubated and taken to recovery in stable condition.   Disposition: To PACU stable   V. [name], M.D. [facility] Office: [phone] Pager:  [phone]

## 2018-07-06 NOTE — H&P (Signed)
Patient name: Christian Bailey MRN: [id number] DOB: Jun 23, 1941 Sex: male  REASON FOR VISIT:     pre-op  HISTORY OF PRESENT ILLNESS:   Christian Bailey a [phone] is status post right femoral-popliteal bypass graft with vein on 09/18/2016. This was done for claudication. Postoperatively, the patient's ABIs were below what I thought they should be and therefore he underwent angiography which revealed a widely patent bypass graft and no evidence for the reason why the ABIs were low. He returned home and his walking improve  He recently felt that his right leg was feeling the way did before surgery. Therefore an 01/27/2017 he was taken for angiography. Approximately 60% stenosis of the common femoral and proximal right femoral-popliteal bypass graft was identified. These were successfully treated using Orbitalatherectomy and a 5 mm drug coated balloon.  His symptoms recurred. And therefore, on 09/08/2017 he underwent revision of his bypass graft. This was because of a high-grade stenosis in the proximal portion. Intraoperatively he had very dense scar tissue. I had to resect the diseased vein. I looked for other pieces of vein to serve as an interposition graft but he did not have suitable conduit. I therefore elected to resect the proximal portion of the superficial femoral artery and endarterectomized this and use this as an interposition graft between the bovine pericardial patch and the distal vein graft. He is back today for follow-up.His symptoms are on changed and he is having trouble getting to the mailbox because of pain in his legs. He does not have any open wounds.  I sent him for a CT angiogram and he is here today to discuss the results.  CURRENT MEDICATIONS:    Current Facility-Administered Medications  Medication Dose Route Frequency Provider Last Rate Last Dose  . 0.9 %  sodium chloride infusion (Manually program via Guardrails  IV Fluids)   Intravenous Once Holtzman, [employer], CRNA      . 0.9 %  sodium chloride infusion   Intravenous Continuous Serafina Mitchell, MD      . ceFAZolin (ANCEF) IVPB 2g/100 mL premix  2 g Intravenous To SS-Surg Serafina Mitchell, MD      . chlorhexidine (HIBICLENS) 4 % liquid 4 application  60 mL Topical Once Serafina Mitchell, MD       And  . Derrill Memo ON 07/07/2018] chlorhexidine (HIBICLENS) 4 % liquid 4 application  60 mL Topical Once Serafina Mitchell, MD      . vancomycin (VANCOCIN) IVPB 1000 mg/200 mL premix  1,000 mg Intravenous To SS-Surg Serafina Mitchell, MD        REVIEW OF SYSTEMS:   [X]  denotes positive finding, [ ]  denotes negative finding Cardiac  Comments:  Chest pain or chest pressure:    Shortness of breath upon exertion:    Short of breath when lying flat:    Irregular heart rhythm:    Constitutional    Fever or chills:      PHYSICAL EXAM:   Vitals:   07/06/18 0708  BP: (!) 115/50  Pulse: 95  Resp: 18  Temp: 97.7 F (36.5 C)  TempSrc: Oral  SpO2: 98%  Weight: 68.9 kg  Height: 5\' 5"  (1.651 m)    GENERAL: The patient is a well-nourished male, in no acute distress. The vital signs are documented above. CARDIOVASCULAR: There is a regular rate and rhythm. PULMONARY: Non-labored respirations   STUDIES:   none   MEDICAL ISSUES:   Plan to proceed with ABF and possible redo right  fem pop bypas.  All questions answered  [name], MD [facility] [phone] Pager [phone]

## 2018-07-06 NOTE — Anesthesia Procedure Notes (Signed)
Procedure Name: Intubation Date/Time: 07/06/2018 8:41 AM Performed by: Glynda Jaeger, CRNA Pre-anesthesia Checklist: Patient identified, Patient being monitored, Timeout performed, Emergency Drugs available and Suction available Patient Re-evaluated:Patient Re-evaluated prior to induction Oxygen Delivery Method: Circle System Utilized Preoxygenation: Pre-oxygenation with 100% oxygen Induction Type: IV induction Ventilation: Oral airway inserted - appropriate to patient size Laryngoscope Size: Glidescope and 4 Grade View: Grade I Tube type: Oral Tube size: 7.5 mm Number of attempts: 1 Airway Equipment and Method: Video-laryngoscopy Placement Confirmation: ETT inserted through vocal cords under direct vision,  positive ETCO2 and breath sounds checked- equal and bilateral Secured at: 22 cm Tube secured with: Tape Dental Injury: Teeth and Oropharynx as per pre-operative assessment  Difficulty Due To: Difficulty was anticipated, Difficult Airway- due to reduced neck mobility and Difficult Airway- due to dentition Future Recommendations: Recommend- induction with short-acting agent, and alternative techniques readily available

## 2018-07-06 NOTE — Anesthesia Postprocedure Evaluation (Signed)
Anesthesia Post Note  Patient: Christian Bailey  Procedure(s) Performed: AORTA BIFEMORAL BYPASS GRAFT USING 14X7MM X 40CM HEMASHIELD GOLD GRAFT (N/A Abdomen) REVISION RIGHT FEMORAL TO POPLITEAL ARTERY BYPASS GRAFT (Right Groin)     Patient location during evaluation: PACU Anesthesia Type: General Level of consciousness: awake and alert Pain management: pain level controlled Vital Signs Assessment: post-procedure vital signs reviewed and stable Respiratory status: spontaneous breathing, nonlabored ventilation, respiratory function stable and patient connected to nasal cannula oxygen Cardiovascular status: blood pressure returned to baseline and stable Postop Assessment: no apparent nausea or vomiting Anesthetic complications: no    Last Vitals:  Vitals:   07/06/18 1645 07/06/18 1657  BP:  134/77  Pulse: 87 88  Resp: 10 14  Temp:    SpO2: 100% 98%    Last Pain:  Vitals:   07/06/18 1659  TempSrc:   PainSc: 6                  Terilynn Buresh S

## 2018-07-06 NOTE — Anesthesia Procedure Notes (Signed)
Arterial Line Insertion Start/End1/01/2019 8:08 AM, 07/06/2018 8:13 AM Performed by: Inda Coke, CRNA, CRNA  Preanesthetic checklist: patient identified, IV checked, site marked, risks and benefits discussed, surgical consent, monitors and equipment checked, pre-op evaluation, timeout performed and anesthesia consent Patient sedated Left, radial was placed Catheter size: 20 G Hand hygiene performed  and maximum sterile barriers used  Allen's test indicative of satisfactory collateral circulation Attempts: 1 Procedure performed without using ultrasound guided technique. Following insertion, dressing applied and Biopatch. Post procedure assessment: normal  Patient tolerated the procedure well with no immediate complications.

## 2018-07-06 NOTE — OR Nursing (Signed)
Family updated by phone to volunteer desk @1335  per Dr. Stephens Shire request.

## 2018-07-06 NOTE — Transfer of Care (Signed)
Immediate Anesthesia Transfer of Care Note  Patient: Christian Bailey  Procedure(s) Performed: AORTA BIFEMORAL BYPASS GRAFT USING 14X7MM X 40CM HEMASHIELD GOLD GRAFT (N/A Abdomen) REVISION RIGHT FEMORAL TO POPLITEAL ARTERY BYPASS GRAFT (Right Groin)  Patient Location: PACU  Anesthesia Type:General  Level of Consciousness: awake, oriented and patient cooperative  Airway & Oxygen Therapy: Patient Spontanous Breathing and Patient connected to nasal cannula oxygen  Post-op Assessment: Report given to RN and Post -op Vital signs reviewed and stable  Post vital signs: Reviewed and stable  Last Vitals:  Vitals Value Taken Time  BP 164/65 07/06/2018  4:25 PM  Temp    Pulse 91 07/06/2018  4:36 PM  Resp 12 07/06/2018  4:36 PM  SpO2 100 % 07/06/2018  4:36 PM  Vitals shown include unvalidated device data.  Last Pain:  Vitals:   07/06/18 1615  TempSrc:   PainSc: Asleep         Complications: No apparent anesthesia complications

## 2018-07-06 NOTE — Progress Notes (Signed)
S/p ABF  Resting comfortably Good UOP + pedal doppler signals Wounds clean  Wells Brabham

## 2018-07-07 ENCOUNTER — Encounter (HOSPITAL_COMMUNITY): Payer: Self-pay | Admitting: Surgery

## 2018-07-07 ENCOUNTER — Inpatient Hospital Stay (HOSPITAL_COMMUNITY): Payer: Medicare Other

## 2018-07-07 LAB — GLUCOSE, CAPILLARY
Glucose-Capillary: 135 mg/dL — ABNORMAL HIGH (ref 70–99)
Glucose-Capillary: 198 mg/dL — ABNORMAL HIGH (ref 70–99)
Glucose-Capillary: 207 mg/dL — ABNORMAL HIGH (ref 70–99)
Glucose-Capillary: 216 mg/dL — ABNORMAL HIGH (ref 70–99)
Glucose-Capillary: 239 mg/dL — ABNORMAL HIGH (ref 70–99)
Glucose-Capillary: 254 mg/dL — ABNORMAL HIGH (ref 70–99)
Glucose-Capillary: 43 mg/dL — CL (ref 70–99)

## 2018-07-07 LAB — COMPREHENSIVE METABOLIC PANEL
ALT: 12 U/L (ref 0–44)
AST: 23 U/L (ref 15–41)
Albumin: 3.1 g/dL — ABNORMAL LOW (ref 3.5–5.0)
Alkaline Phosphatase: 46 U/L (ref 38–126)
Anion gap: 11 (ref 5–15)
BUN: 19 mg/dL (ref 8–23)
CO2: 20 mmol/L — ABNORMAL LOW (ref 22–32)
Calcium: 7.7 mg/dL — ABNORMAL LOW (ref 8.9–10.3)
Chloride: 106 mmol/L (ref 98–111)
Creatinine, Ser: 1.3 mg/dL — ABNORMAL HIGH (ref 0.61–1.24)
GFR calc Af Amer: >60 mL/min (ref >60)
GFR calc non Af Amer: 53 mL/min — ABNORMAL LOW (ref >60)
Glucose, Bld: 251 mg/dL — ABNORMAL HIGH (ref 70–99)
Potassium: 4.5 mmol/L (ref 3.5–5.1)
Sodium: 137 mmol/L (ref 135–145)
Total Bilirubin: 0.8 mg/dL (ref 0.3–1.2)
Total Protein: 5.5 g/dL — ABNORMAL LOW (ref 6.5–8.1)

## 2018-07-07 LAB — CBC
HCT: 38.1 % — ABNORMAL LOW (ref 39.0–52.0)
Hemoglobin: 12.1 g/dL — ABNORMAL LOW (ref 13.0–17.0)
MCH: 29.1 pg (ref 26.0–34.0)
MCHC: 31.8 g/dL (ref 30.0–36.0)
MCV: 91.6 fL (ref 80.0–100.0)
Platelets: 212 10*3/uL (ref 150–400)
RBC: 4.16 MIL/uL — ABNORMAL LOW (ref 4.22–5.81)
RDW: 13.6 % (ref 11.5–15.5)
WBC: 14.2 10*3/uL — ABNORMAL HIGH (ref 4.0–10.5)
nRBC: 0 % (ref 0.0–0.2)

## 2018-07-07 LAB — MAGNESIUM: Magnesium: 1.4 mg/dL — ABNORMAL LOW (ref 1.7–2.4)

## 2018-07-07 LAB — AMYLASE: Amylase: 118 U/L — ABNORMAL HIGH (ref 28–100)

## 2018-07-07 MED ORDER — MUPIROCIN 2 % EX OINT
TOPICAL_OINTMENT | Freq: Two times a day (BID) | CUTANEOUS | Status: DC
  Administered 2018-07-07: 22:00:00 via NASAL
  Administered 2018-07-07 – 2018-07-08 (×3): 1 via NASAL
  Administered 2018-07-09: 22:00:00 via NASAL
  Administered 2018-07-09: 1 via NASAL
  Administered 2018-07-10 – 2018-07-14 (×10): via NASAL
  Administered 2018-07-15: 1 via NASAL
  Administered 2018-07-15 – 2018-07-18 (×7): via NASAL
  Administered 2018-07-19: 1 via NASAL
  Administered 2018-07-19 – 2018-07-20 (×2): via NASAL
  Administered 2018-07-20: 1 via NASAL
  Administered 2018-07-21 – 2018-07-28 (×12): via NASAL
  Administered 2018-07-28: 1 via NASAL
  Administered 2018-07-29 (×2): via NASAL
  Filled 2018-07-07 (×4): qty 22

## 2018-07-07 MED ORDER — DOCUSATE SODIUM 50 MG/5ML PO LIQD
100.0000 mg | Freq: Every day | ORAL | Status: DC
  Administered 2018-07-07 – 2018-07-08 (×2): 100 mg
  Filled 2018-07-07 (×3): qty 10

## 2018-07-07 MED ORDER — KETOROLAC TROMETHAMINE 30 MG/ML IJ SOLN: 30.0000 mg | Freq: Four times a day (QID) | INTRAMUSCULAR | Status: DC

## 2018-07-07 MED ORDER — DEXTROSE 50 % IV SOLN
50.0000 mL | Freq: Once | INTRAVENOUS | Status: AC
  Administered 2018-07-07: 50 mL via INTRAVENOUS

## 2018-07-07 MED ORDER — DEXTROSE 50 % IV SOLN
INTRAVENOUS | Status: AC
  Administered 2018-07-07: 50 mL via INTRAVENOUS
  Filled 2018-07-07: qty 50

## 2018-07-07 MED ORDER — KETOROLAC TROMETHAMINE 30 MG/ML IJ SOLN
15.0000 mg | Freq: Four times a day (QID) | INTRAMUSCULAR | Status: DC
  Administered 2018-07-07 – 2018-07-08 (×4): 15 mg via INTRAVENOUS
  Filled 2018-07-07 (×5): qty 1

## 2018-07-07 MED ORDER — PANTOPRAZOLE SODIUM 40 MG PO PACK
40.0000 mg | PACK | Freq: Every day | ORAL | Status: DC
  Administered 2018-07-07: 40 mg
  Filled 2018-07-07: qty 20

## 2018-07-07 NOTE — Progress Notes (Signed)
Inpatient Diabetes Program Recommendations  AACE/ADA: New Consensus Statement on Inpatient Glycemic Control (2015)  Target Ranges:  Prepandial:   less than 140 mg/dL      Peak postprandial:   less than 180 mg/dL (1-2 hours)      Critically ill patients:  140 - 180 mg/dL   Lab Results  Component Value Date   GLUCAP 198 (H) 07/07/2018   HGBA1C 5.8 (H) 07/01/2018    Review of Glycemic Control Results for MATHEU, PLOEGER (MRN [id number]) as of 07/07/2018 14:11  Ref. Range 07/06/2018 20:43 07/06/2018 23:54 07/07/2018 03:55 07/07/2018 07:59 07/07/2018 12:05  Glucose-Capillary Latest Ref Range: 70 - 99 mg/dL 204 (H) 217 (H) 254 (H) 216 (H) 198 (H)   Diabetes history: DM 2 Outpatient Diabetes medications:  Metformin 1000 mg bid Current orders for Inpatient glycemic control:  Novolog sensitive q 4 hours  Inpatient Diabetes Program Recommendations:    Blood sugars> goal. Please consider adding Lantus 12 units daily while patient is NPO and unable to take PO medications.    Thanks,  Adah Perl, RN, BC-ADM Inpatient Diabetes Coordinator Pager [phone] (8a-5p)

## 2018-07-07 NOTE — Evaluation (Signed)
Physical Therapy Evaluation Patient Details Name: Christian Bailey MRN: [id number] DOB: Feb 24, 1941 Today's Date: 07/07/2018   History of Present Illness  Pt s/p aortobifemoral bypass and redo of rt fem pop bypass on 07/06/18. PMH - rt reverse total shoulder, HTN, DM, OA, PAD, lower extremity bypass  Clinical Impression  Pt presents to PT with expected limitations in mobility due to expected post op pain. Expect pt will make good progress and be able to return home with wife.     Follow Up Recommendations Home health PT(likely will progress and not need)    Equipment Recommendations  None recommended by PT(TBD)    Recommendations for Other Services       Precautions / Restrictions Precautions Precautions: Fall;Other (comment)(wound vac) Restrictions Weight Bearing Restrictions: No      Mobility  Bed Mobility Overal bed mobility: Needs Assistance Bed Mobility: Rolling;Sidelying to Sit Rolling: +2 for physical assistance;Max assist Sidelying to sit: +2 for physical assistance;Total assist       General bed mobility comments: Assist for all aspects due to pain  Transfers Overall transfer level: Needs assistance Equipment used: Rolling walker (2 wheeled) Transfers: Sit to/from [employer] Transfers Sit to Stand: +2 physical assistance;Min assist Stand pivot transfers: +2 physical assistance;Min assist       General transfer comment: Assist to bring hips up.  Ambulation/Gait                Stairs            Wheelchair Mobility    Modified Rankin (Stroke Patients Only)       Balance Overall balance assessment: Needs assistance Sitting-balance support: No upper extremity supported;Feet supported Sitting balance-Leahy Scale: Poor Sitting balance - Comments: min assist Postural control: Posterior lean(likely due to pain) Standing balance support: Bilateral upper extremity supported Standing balance-Leahy Scale: Poor Standing balance comment: walker  and min assist for static standing                             Pertinent Vitals/Pain Pain Assessment: Faces Faces Pain Scale: Hurts whole lot Pain Location: abdomen and groin Pain Descriptors / Indicators: Operative site guarding;Grimacing Pain Intervention(s): Limited activity within patient's tolerance;Monitored during session;Repositioned;Patient requesting pain meds-RN notified    Home Living Family/patient expects to be discharged to:: Private residence Living Arrangements: Spouse/significant other Available Help at Discharge: Family;Available 24 hours/day Type of Home: House Home Access: Stairs to enter Entrance Stairs-Rails: Left Entrance Stairs-Number of Steps: 3 Home Layout: One level Home Equipment: Shower seat - built in;Grab bars - tub/shower;Adaptive equipment      Prior Function Level of Independence: Independent with assistive device(s)         Comments: uses AE and shower seat (built in) for bathing/dressing     Hand Dominance   Dominant Hand: Right    Extremity/Trunk Assessment   Upper Extremity Assessment Upper Extremity Assessment: Defer to OT evaluation    Lower Extremity Assessment Lower Extremity Assessment: Generalized weakness       Communication   Communication: No difficulties  Cognition Arousal/Alertness: Awake/alert Behavior During Therapy: WFL for tasks assessed/performed Overall Cognitive Status: Within Functional Limits for tasks assessed                                        General Comments      Exercises  Assessment/Plan    PT Assessment Patient needs continued PT services  PT Problem List Decreased strength;Decreased activity tolerance;Decreased balance;Decreased mobility;Pain       PT Treatment Interventions DME instruction;Gait training;Functional mobility training;Therapeutic activities;Therapeutic exercise;Balance training;Patient/family education    PT Goals (Current goals can  be found in the Care Plan section)  Acute Rehab PT Goals Patient Stated Goal: return home PT Goal Formulation: With patient Time For Goal Achievement: 07/14/18 Potential to Achieve Goals: Good    Frequency Min 3X/week   Barriers to discharge Inaccessible home environment stairs to enter    Co-evaluation               AM-PAC PT "6 Clicks" Mobility  Outcome Measure Help needed turning from your back to your side while in a flat bed without using bedrails?: Total Help needed moving from lying on your back to sitting on the side of a flat bed without using bedrails?: Total Help needed moving to and from a bed to a chair (including a wheelchair)?: A Lot Help needed standing up from a chair using your arms (e.g., wheelchair or bedside chair)?: A Little Help needed to walk in hospital room?: Total Help needed climbing 3-5 steps with a railing? : Total 6 Click Score: 9    End of Session Equipment Utilized During Treatment: Gait belt Activity Tolerance: Patient limited by pain;Other (comment)(nausea) Patient left: in chair;with call bell/phone within reach;with family/visitor present Nurse Communication: Mobility status PT Visit Diagnosis: Other abnormalities of gait and mobility (R26.89);Pain Pain - part of body: (abdomen)    Time: 0086-7619 PT Time Calculation (min) (ACUTE ONLY): 31 min   Charges:   PT Evaluation $PT Eval Moderate Complexity: 1 Mod PT Treatments $Gait Training: 8-22 mins        [place] Pager [phone] Office [place] 07/07/2018, 4:30 PM

## 2018-07-07 NOTE — Progress Notes (Addendum)
[facility] [place]  Subjective  - doing as well as to be expected.   Objective (!) 131/55 (!) 113 (!) 97.4 F (36.3 C) (Oral) 18 96%  Intake/Output Summary (Last 24 hours) at 07/07/2018 0903 Last data filed at 07/07/2018 0600 Gross per 24 hour  Intake 6340.08 ml  Output 2885 ml  Net 3455.08 ml    Doppler DP B LE with active range of motion and intact sensation B groins with incisional vacs 0 output Abdomin with healing central incision neg BS NG tube in place 60 cc output Non labored breathing Heart RRR   Assessment/Planning: POD # 1 Procedure:   #1: Aortobifemoral bypass graft (14 x 7)                         #2: Redo right femoral artery exposure                         #3: Revision of right femoral-popliteal bypass graft using a 6 mm Gore-Tex interposition graft                         #4: Placement of bilateral groin Praveena wound VAC  Blood Loss: 1400 cc.  2 units of blood and Cell Saver were returned. HGB 12.1 stable surgical blood loss anemia Will maintain NG tube until flatus has passed Maintain IV fluids UO good   Christian Bailey 07/07/2018 9:03 AM --  Laboratory Lab Results: Recent Labs    07/06/18 1600 07/07/18 0711  WBC 18.5* 14.2*  HGB 11.9* 12.1*  HCT 35.9* 38.1*  PLT 258 212   BMET Recent Labs    07/06/18 1600 07/07/18 0711  NA 140 137  K 4.4 4.5  CL 110 106  CO2 22 20*  GLUCOSE 217* 251*  BUN 20 19  CREATININE 1.25* 1.30*  CALCIUM 7.8* 7.7*    COAG Lab Results  Component Value Date   INR 1.25 07/06/2018   INR 1.04 07/01/2018   INR 1.04 09/06/2017   No results found for: PTT   POD#1  Pain:  Start Toradol x 3 days ID:  No issues PT/OT:  OOB and mobilize CV:  Brisk right DDP, Bi/Mono Left DP Pulm:  IS Q 1 hour Prophylaxis:  SQ hepari, Protonix GU:  Good UOP, keep foley in place GI:  Keep NPO, Keep NGT  Christian Bailey

## 2018-07-08 ENCOUNTER — Other Ambulatory Visit: Payer: Self-pay

## 2018-07-08 LAB — GLUCOSE, CAPILLARY
Glucose-Capillary: 105 mg/dL — ABNORMAL HIGH (ref 70–99)
Glucose-Capillary: 116 mg/dL — ABNORMAL HIGH (ref 70–99)
Glucose-Capillary: 126 mg/dL — ABNORMAL HIGH (ref 70–99)
Glucose-Capillary: 145 mg/dL — ABNORMAL HIGH (ref 70–99)
Glucose-Capillary: 146 mg/dL — ABNORMAL HIGH (ref 70–99)
Glucose-Capillary: 178 mg/dL — ABNORMAL HIGH (ref 70–99)

## 2018-07-08 LAB — CBC
HCT: 30 % — ABNORMAL LOW (ref 39.0–52.0)
Hemoglobin: 9.6 g/dL — ABNORMAL LOW (ref 13.0–17.0)
MCH: 30.2 pg (ref 26.0–34.0)
MCHC: 32 g/dL (ref 30.0–36.0)
MCV: 94.3 fL (ref 80.0–100.0)
Platelets: 202 10*3/uL (ref 150–400)
RBC: 3.18 MIL/uL — ABNORMAL LOW (ref 4.22–5.81)
RDW: 13.9 % (ref 11.5–15.5)
WBC: 12.8 10*3/uL — ABNORMAL HIGH (ref 4.0–10.5)
nRBC: 0 % (ref 0.0–0.2)

## 2018-07-08 LAB — POCT I-STAT 7, (LYTES, BLD GAS, ICA,H+H)
Acid-Base Excess: 3 mmol/L — ABNORMAL HIGH (ref 0.0–2.0)
Acid-Base Excess: 3 mmol/L — ABNORMAL HIGH (ref 0.0–2.0)
Acid-base deficit: 1 mmol/L (ref 0.0–2.0)
Acid-base deficit: 2 mmol/L (ref 0.0–2.0)
BICARBONATE: 24.5 mmol/L (ref 20.0–28.0)
Bicarbonate: 27.6 mmol/L (ref 20.0–28.0)
Bicarbonate: 27.4 mmol/L (ref 20.0–28.0)
Bicarbonate: 23.1 mmol/L (ref 20.0–28.0)
Calcium, Ion: 1.23 mmol/L (ref 1.15–1.40)
Calcium, Ion: 1.21 mmol/L (ref 1.15–1.40)
Calcium, Ion: 1.14 mmol/L — ABNORMAL LOW (ref 1.15–1.40)
Calcium, Ion: 1.11 mmol/L — ABNORMAL LOW (ref 1.15–1.40)
HCT: 28 % — ABNORMAL LOW (ref 39.0–52.0)
HCT: 25 % — ABNORMAL LOW (ref 39.0–52.0)
HCT: 31 % — ABNORMAL LOW (ref 39.0–52.0)
HCT: 32 % — ABNORMAL LOW (ref 39.0–52.0)
HEMOGLOBIN: 10.9 g/dL — AB (ref 13.0–17.0)
Hemoglobin: 9.5 g/dL — ABNORMAL LOW (ref 13.0–17.0)
Hemoglobin: 8.5 g/dL — ABNORMAL LOW (ref 13.0–17.0)
Hemoglobin: 10.5 g/dL — ABNORMAL LOW (ref 13.0–17.0)
O2 Saturation: 100 %
O2 Saturation: 100 %
O2 Saturation: 100 %
O2 Saturation: 100 %
POTASSIUM: 4.2 mmol/L (ref 3.5–5.1)
Patient temperature: 35.7
Potassium: 4.6 mmol/L (ref 3.5–5.1)
Potassium: 4 mmol/L (ref 3.5–5.1)
Potassium: 4.1 mmol/L (ref 3.5–5.1)
SODIUM: 141 mmol/L (ref 135–145)
Sodium: 140 mmol/L (ref 135–145)
Sodium: 140 mmol/L (ref 135–145)
Sodium: 140 mmol/L (ref 135–145)
TCO2: 29 mmol/L (ref 22–32)
TCO2: 29 mmol/L (ref 22–32)
TCO2: 26 mmol/L (ref 22–32)
TCO2: 24 mmol/L (ref 22–32)
pCO2 arterial: 42.1 mmHg (ref 32.0–48.0)
pCO2 arterial: 41.8 mmHg (ref 32.0–48.0)
pCO2 arterial: 39.4 mmHg (ref 32.0–48.0)
pCO2 arterial: 39.9 mmHg (ref 32.0–48.0)
pH, Arterial: 7.425 (ref 7.350–7.450)
pH, Arterial: 7.425 (ref 7.350–7.450)
pH, Arterial: 7.396 (ref 7.350–7.450)
pH, Arterial: 7.371 (ref 7.350–7.450)
pO2, Arterial: 255 mmHg — ABNORMAL HIGH (ref 83.0–108.0)
pO2, Arterial: 249 mmHg — ABNORMAL HIGH (ref 83.0–108.0)
pO2, Arterial: 247 mmHg — ABNORMAL HIGH (ref 83.0–108.0)
pO2, Arterial: 216 mmHg — ABNORMAL HIGH (ref 83.0–108.0)

## 2018-07-08 LAB — BASIC METABOLIC PANEL
Anion gap: 7 (ref 5–15)
BUN: 17 mg/dL (ref 8–23)
CO2: 22 mmol/L (ref 22–32)
Calcium: 7.3 mg/dL — ABNORMAL LOW (ref 8.9–10.3)
Chloride: 108 mmol/L (ref 98–111)
Creatinine, Ser: 1.49 mg/dL — ABNORMAL HIGH (ref 0.61–1.24)
GFR calc Af Amer: 52 mL/min — ABNORMAL LOW (ref >60)
GFR calc non Af Amer: 45 mL/min — ABNORMAL LOW (ref >60)
Glucose, Bld: 180 mg/dL — ABNORMAL HIGH (ref 70–99)
POTASSIUM: 4.4 mmol/L (ref 3.5–5.1)
Sodium: 137 mmol/L (ref 135–145)

## 2018-07-08 LAB — POCT I-STAT 4, (NA,K, GLUC, HGB,HCT)
Glucose, Bld: 205 mg/dL — ABNORMAL HIGH (ref 70–99)
HCT: 31 % — ABNORMAL LOW (ref 39.0–52.0)
Hemoglobin: 10.5 g/dL — ABNORMAL LOW (ref 13.0–17.0)
Potassium: 4.1 mmol/L (ref 3.5–5.1)
Sodium: 142 mmol/L (ref 135–145)

## 2018-07-08 LAB — POCT ACTIVATED CLOTTING TIME
Activated Clotting Time: 197 seconds
Activated Clotting Time: 208 seconds

## 2018-07-08 MED ORDER — ACETAMINOPHEN 10 MG/ML IV SOLN
1000.0000 mg | Freq: Four times a day (QID) | INTRAVENOUS | Status: AC
  Administered 2018-07-08 – 2018-07-09 (×4): 1000 mg via INTRAVENOUS
  Filled 2018-07-08 (×4): qty 100

## 2018-07-08 MED ORDER — PANTOPRAZOLE SODIUM 40 MG IV SOLR
40.0000 mg | Freq: Every day | INTRAVENOUS | Status: DC
  Administered 2018-07-08 – 2018-07-23 (×16): 40 mg via INTRAVENOUS
  Filled 2018-07-08 (×18): qty 40

## 2018-07-08 MED FILL — Sodium Chloride IV Soln 0.9%: INTRAVENOUS | Qty: 2000 | Status: AC

## 2018-07-08 MED FILL — Heparin Sodium (Porcine) Inj 1000 Unit/ML: INTRAMUSCULAR | Qty: 30 | Status: AC

## 2018-07-08 NOTE — Progress Notes (Signed)
    Subjective  - POD #2, s/p ABF, revision Right fem pop  C/o right calf and abdominal pain No flatus Sat in chair yesterday   Physical Exam:  Midline incision clean.  Provena in both groins Right calf soft, no evidence of compartment syndrome Brisk R>L DP doppler signal Abd tender, but soft       Assessment/Plan:  POD #2  GU:  Creatinine increased from 1.3 to 1.49.  Will stop toradol.  Keep foley to monitor UOP.  May give lasix tomorrow, depending on Creatinine GI:  NGT removed, No flatus, keep NPO F/E/N:  Decrease IVF to 50 ID:  No issues Prophylaxis: SQ heparin and protonix Acute blood loss anemia:  Hct down today.  Will monitor PT/OT:  Hopefully walking today Keep in ICU one more day D/c A-line Pain control:  Try IV tylenol  Wells Deon Duer 07/08/2018 7:41 AM --  Vitals:   07/08/18 0600 07/08/18 0700  BP: (!) 121/53 (!) 120/54  Pulse: (!) 130 (!) 138  Resp: 11 14  Temp:    SpO2: 99% 95%    Intake/Output Summary (Last 24 hours) at 07/08/2018 0741 Last data filed at 07/08/2018 7829 Gross per 24 hour  Intake 1728.13 ml  Output 885 ml  Net 843.13 ml     Laboratory CBC    Component Value Date/Time   WBC 12.8 (H) 07/08/2018 0616   HGB 9.6 (L) 07/08/2018 0616   HCT 30.0 (L) 07/08/2018 0616   PLT 202 07/08/2018 0616    BMET    Component Value Date/Time   NA 137 07/08/2018 0616   K 4.4 07/08/2018 0616   CL 108 07/08/2018 0616   CO2 22 07/08/2018 0616   GLUCOSE 180 (H) 07/08/2018 0616   BUN 17 07/08/2018 0616   CREATININE 1.49 (H) 07/08/2018 0616   CREATININE 1.33 (H) 04/10/2016 0851   CALCIUM 7.3 (L) 07/08/2018 0616   GFRNONAA 45 (L) 07/08/2018 0616   GFRAA 52 (L) 07/08/2018 0616    COAG Lab Results  Component Value Date   INR 1.25 07/06/2018   INR 1.04 07/01/2018   INR 1.04 09/06/2017   No results found for: PTT  Antibiotics Anti-infectives (From admission, onward)   Start     Dose/Rate Route Frequency Ordered Stop   07/06/18 2200   ceFAZolin (ANCEF) IVPB 2g/100 mL premix     2 g 200 mL/hr over 30 Minutes Intravenous Every 8 hours 07/06/18 1840 07/07/18 0548   07/06/18 0730  ceFAZolin (ANCEF) IVPB 2g/100 mL premix     2 g 200 mL/hr over 30 Minutes Intravenous To [facility] 07/05/18 1140 07/06/18 1443   07/06/18 0700  vancomycin (VANCOCIN) IVPB 1000 mg/200 mL premix     1,000 mg 200 mL/hr over 60 Minutes Intravenous To [facility] 07/05/18 1154 07/06/18 1006       V. Leia Alf, M.D. [facility] [place] Office: [phone] Pager:  [phone]

## 2018-07-08 NOTE — Progress Notes (Signed)
Hypoglycemic Event  CBG: 43 @ 2329 drawn from [facility] after finger stick read <10  Treatment: given 1 amp (65mL) D50  Symptoms: none, patient generally sleepy  Follow-up CBG: Time: 2349 CBG Result: 239  Possible Reasons for Event: Patient was possibly not hypoglycemic. CBG was checked multiple times and all read low, treated accordingly. Repeat CBG drawn from [facility]ine as well.  Comments/MD notified: Treated per protocol    Charlsie Quest

## 2018-07-08 NOTE — Progress Notes (Signed)
[facility] [place]  Subjective  - Chief complaint of right calf pain since sitting up.     Objective (!) 120/54 (!) 138 97.9 F (36.6 C) (Oral) 14 95%  Intake/Output Summary (Last 24 hours) at 07/08/2018 0744 Last data filed at 07/08/2018 0620 Gross per 24 hour  Intake 1728.13 ml  Output 885 ml  Net 843.13 ml    Doppler signals brisk DP B LE.  Active range of motion intact with soft distal LE compartments. Abdomin distended, compressible.  No flatus.   Heart RRR Lungs O2 support via [place] non labored breathing NG tube d/c'd by [name] patient tolerated this well.  Assessment/Planning: POD # 2 Procedure:#1: Aortobifemoral bypass graft (14 x 7) #2: Redo right femoral artery exposure #3: Revision of right femoral-popliteal bypass graft using a 6 mm Gore-Tex interposition graft #4: Placement of bilateral groin Praveena wound VAC  NG tube D/C, will d/c a line Encourage mobility and ambulation  Cr 1.49 this am, fluids at 125 cc.  UO 885 last 24 hours. Encourage IS Continue NPO until flatus occurs   Christian Bailey 07/08/2018 7:44 AM --  Laboratory Lab Results: Recent Labs    07/07/18 0711 07/08/18 0616  WBC 14.2* 12.8*  HGB 12.1* 9.6*  HCT 38.1* 30.0*  PLT 212 202   BMET Recent Labs    07/07/18 0711 07/08/18 0616  NA 137 137  K 4.5 4.4  CL 106 108  CO2 20* 22  GLUCOSE 251* 180*  BUN 19 17  CREATININE 1.30* 1.49*  CALCIUM 7.7* 7.3*    COAG Lab Results  Component Value Date   INR 1.25 07/06/2018   INR 1.04 07/01/2018   INR 1.04 09/06/2017   No results found for: PTT

## 2018-07-08 NOTE — Progress Notes (Signed)
Inpatient Diabetes Program Recommendations  AACE/ADA: New Consensus Statement on Inpatient Glycemic Control (2015)  Target Ranges:  Prepandial:   less than 140 mg/dL      Peak postprandial:   less than 180 mg/dL (1-2 hours)      Critically ill patients:  140 - 180 mg/dL   Lab Results  Component Value Date   GLUCAP 178 (H) 07/08/2018   HGBA1C 5.8 (H) 07/01/2018    Review of Glycemic ControlResults for JAAZIEL, PEATROSS (MRN [id number]) as of 07/08/2018 08:51  Ref. Range 07/07/2018 16:04 07/07/2018 19:38 07/07/2018 23:27 07/07/2018 23:29 07/07/2018 23:49 07/08/2018 03:34 07/08/2018 08:29  Glucose-Capillary Latest Ref Range: 70 - 99 mg/dL 207 (H) 135 (H) <10 (LL) 43 (LL) 239 (H) 146 (H) 178 (H)   Diabetes history: DM 2 Outpatient Diabetes medications:  Metformin 1000 mg bid Current orders for Inpatient glycemic control:  Novolog sensitive q 4 hours Inpatient Diabetes Program Recommendations:   Note low blood sugar overnight which was verified with lab. May need to change correction to q 6 hours?  Will follow.   Thanks,  Adah Perl, RN, BC-ADM Inpatient Diabetes Coordinator Pager [phone] (8a-5p)

## 2018-07-08 NOTE — Progress Notes (Signed)
Physical Therapy Treatment Patient Details Name: Christian Bailey MRN: [id number] DOB: 1940-07-04 Today's Date: 07/08/2018    History of Present Illness Pt s/p aortobifemoral bypass and redo of rt fem pop bypass on 07/06/18. PMH - rt reverse total shoulder, HTN, DM, OA, PAD, lower extremity bypass    PT Comments    Patient progressing steadily towards his physical therapy goals this session. Pt less limited by pain this session and motivated to participate. Increased ambulation distance to 30 feet using walker and chair follow. Resting HR 130's and peaked at 157 bpm with mobility; RN aware. Will continue to progress as tolerated.   Follow Up Recommendations  Home health PT     Equipment Recommendations  Rolling walker with 5" wheels    Recommendations for Other Services       Precautions / Restrictions Precautions Precautions: Fall;Other (comment)(wound vac) Restrictions Weight Bearing Restrictions: No    Mobility  Bed Mobility Overal bed mobility: Needs Assistance Bed Mobility: Rolling;Sidelying to Sit Rolling: Min assist Sidelying to sit: Min assist       General bed mobility comments: min assist to elevate trunk  Transfers Overall transfer level: Needs assistance Equipment used: Rolling walker (2 wheeled) Transfers: Sit to/from [facility] Sit to Stand: Min assist;+2 safety/equipment         General transfer comment: Min assist to bring hips up and stabilize  Ambulation/Gait Ambulation/Gait assistance: Min assist;+2 safety/equipment Gait Distance (Feet): 30 Feet Assistive device: Rolling walker (2 wheeled) Gait Pattern/deviations: Step-through pattern;Decreased stride length Gait velocity: decreased Gait velocity interpretation: <1.8 ft/sec, indicate of risk for recurrent falls General Gait Details: Pt with slow cadence and mild unsteadiness throughout. directional cues provided. Chair follow utilized   Environmental education officer [place] (Stroke Patients Only)       Balance Overall balance assessment: Needs assistance Sitting-balance support: No upper extremity supported;Feet unsupported Sitting balance-Leahy Scale: Good Sitting balance - Comments: min assist   Standing balance support: Bilateral upper extremity supported Standing balance-Leahy Scale: Poor Standing balance comment: walker and min assist for static standing                            Cognition Arousal/Alertness: Awake/alert Behavior During Therapy: WFL for tasks assessed/performed Overall Cognitive Status: Within Functional Limits for tasks assessed                                        Exercises      General Comments        Pertinent Vitals/Pain Pain Assessment: 0-10 Pain Score: 5  Pain Location: RLE with ambulation, abdomen with bed mobility Pain Descriptors / Indicators: Operative site guarding;Grimacing Pain Intervention(s): Monitored during session    Home Living                      Prior Function            PT Goals (current goals can now be found in the care plan section) Acute Rehab PT Goals Patient Stated Goal: return home PT Goal Formulation: With patient Time For Goal Achievement: 07/14/18 Potential to Achieve Goals: Good Progress towards PT goals: Progressing toward goals    Frequency    Min 3X/week      PT Plan Current  plan remains appropriate    Co-evaluation PT/OT/SLP Co-Evaluation/Treatment: Yes Reason for Co-Treatment: For patient/therapist safety;To address functional/ADL transfers PT goals addressed during session: Mobility/safety with mobility        AM-PAC PT "6 Clicks" Mobility   Outcome Measure  Help needed turning from your back to your side while in a flat bed without using bedrails?: A Little Help needed moving from lying on your back to sitting on the side of a flat bed without using bedrails?: A Little Help needed  moving to and from a bed to a chair (including a wheelchair)?: A Little Help needed standing up from a chair using your arms (e.g., wheelchair or bedside chair)?: A Little Help needed to walk in hospital room?: A Little Help needed climbing 3-5 steps with a railing? : Total 6 Click Score: 16    End of Session Equipment Utilized During Treatment: Gait belt Activity Tolerance: Patient tolerated treatment well Patient left: in chair;with call bell/phone within reach;with family/visitor present Nurse Communication: Mobility status PT Visit Diagnosis: Other abnormalities of gait and mobility (R26.89);Pain Pain - part of body: (abdomen)     Time: 9562-1308 PT Time Calculation (min) (ACUTE ONLY): 28 min  Charges:  $Gait Training: 8-22 mins                    Ellamae Sia, [place], DPT Acute Rehabilitation Services Pager [phone] Office [phone]   Willy Eddy 07/08/2018, 1:15 PM

## 2018-07-08 NOTE — Evaluation (Signed)
Occupational Therapy Evaluation Patient Details Name: Christian Bailey MRN: [id number] DOB: 03/24/1941 Today's Date: 07/08/2018    History of Present Illness Pt s/p aortobifemoral bypass and redo of rt fem pop bypass on 07/06/18. PMH - rt reverse total shoulder, HTN, DM, OA, PAD, lower extremity bypass   Clinical Impression   Pt walks with a RW and is typically modified independent in self care. Pt presents with pain and decreased standing balance. He requires set up to max assist for ADL and min assist for mobility. Will follow acutely.    Follow Up Recommendations  No OT follow up    Equipment Recommendations  None recommended by OT    Recommendations for Other Services       Precautions / Restrictions Precautions Precautions: Fall(wound vac) Restrictions Weight Bearing Restrictions: No      Mobility Bed Mobility Overal bed mobility: Needs Assistance Bed Mobility: Rolling;Sidelying to Sit Rolling: Min assist Sidelying to sit: Min assist       General bed mobility comments: min assist to elevate trunk  Transfers Overall transfer level: Needs assistance Equipment used: Rolling walker (2 wheeled) Transfers: Sit to/from [facility] Sit to Stand: Min assist;+2 safety/equipment Stand pivot transfers: +2 physical assistance;Min assist       General transfer comment: Min assist to bring hips up and stabilize    Balance Overall balance assessment: Needs assistance Sitting-balance support: No upper extremity supported;Feet unsupported Sitting balance-Leahy Scale: Good Sitting balance - Comments: min assist   Standing balance support: Bilateral upper extremity supported Standing balance-Leahy Scale: Poor Standing balance comment: walker and min assist for static standing                           ADL either performed or assessed with clinical judgement   ADL Overall ADL's : Needs assistance/impaired Eating/Feeding: NPO   Grooming: Set  up;Sitting   Upper Body Bathing: Minimal assistance;Sitting   Lower Body Bathing: Maximal assistance;Sit to/from stand   Upper Body Dressing : Minimal assistance;Sitting   Lower Body Dressing: Maximal assistance;Sit to/from stand   Toilet Transfer: Minimal assistance;Ambulation;RW   Toileting- Clothing Manipulation and Hygiene: Maximal assistance;Sit to/from stand       Functional mobility during ADLs: Minimal assistance;Rolling walker General ADL Comments: pt limited by abdominal pain     Vision Baseline Vision/History: Wears glasses;Macular Degeneration Wears Glasses: At all times Patient Visual Report: No change from baseline       Perception     Praxis      Pertinent Vitals/Pain Pain Assessment: Faces Pain Score: 5  Faces Pain Scale: Hurts whole lot Pain Location: RLE with ambulation, abdomen with bed mobility Pain Descriptors / Indicators: Operative site guarding;Grimacing Pain Intervention(s): Monitored during session;Repositioned     Hand Dominance Right   Extremity/Trunk Assessment Upper Extremity Assessment Upper Extremity Assessment: Overall WFL for tasks assessed   Lower Extremity Assessment Lower Extremity Assessment: Defer to PT evaluation       Communication Communication Communication: No difficulties   Cognition Arousal/Alertness: Awake/alert Behavior During Therapy: WFL for tasks assessed/performed Overall Cognitive Status: Within Functional Limits for tasks assessed                                     General Comments       Exercises     Shoulder Instructions      Home Living Family/patient expects to  be discharged to:: Private residence Living Arrangements: Spouse/significant other Available Help at Discharge: Family;Available 24 hours/day Type of Home: House Home Access: Stairs to enter [employer] of Steps: 3 Entrance Stairs-Rails: Left Home Layout: One level     Bathroom Shower/Tub: Emergency planning/management officer: Standard     Home Equipment: Shower seat - built in;Grab bars - tub/shower;Adaptive equipment Adaptive Equipment: Reacher;Long-handled sponge        Prior Functioning/Environment Level of Independence: Independent with assistive device(s)        Comments: uses AE and shower seat (built in) for bathing/dressing        OT Problem List: Impaired balance (sitting and/or standing);Decreased knowledge of use of DME or AE;Pain;Decreased activity tolerance      OT Treatment/Interventions: Self-care/ADL training;DME and/or AE instruction;Patient/family education;Balance training;Therapeutic activities    OT Goals(Current goals can be found in the care plan section) Acute Rehab OT Goals Patient Stated Goal: return home OT Goal Formulation: With patient Time For Goal Achievement: 07/22/18 Potential to Achieve Goals: Good ADL Goals Pt Will Perform Grooming: with supervision;standing Pt Will Perform Lower Body Bathing: with supervision;with adaptive equipment Pt Will Perform Lower Body Dressing: with supervision;sit to/from stand Pt Will Transfer to Toilet: with supervision;ambulating Pt Will Perform Toileting - Clothing Manipulation and hygiene: with supervision;sit to/from stand  OT Frequency: Min 2X/week   Barriers to D/C:            Co-evaluation PT/OT/SLP Co-Evaluation/Treatment: Yes Reason for Co-Treatment: For patient/therapist safety;To address functional/ADL transfers PT goals addressed during session: Mobility/safety with mobility OT goals addressed during session: ADL's and self-care      AM-PAC OT "6 Clicks" Daily Activity     Outcome Measure Help from another person eating meals?: None Help from another person taking care of personal grooming?: A Little Help from another person toileting, which includes using toliet, bedpan, or urinal?: A Little Help from another person bathing (including washing, rinsing, drying)?: A Lot Help from  another person to put on and taking off regular upper body clothing?: A Little Help from another person to put on and taking off regular lower body clothing?: A Lot 6 Click Score: 17   End of Session Equipment Utilized During Treatment: Gait belt;Rolling walker Nurse Communication: Mobility status  Activity Tolerance: Patient limited by pain Patient left: in chair;with call bell/phone within reach  OT Visit Diagnosis: Unsteadiness on feet (R26.81);Other abnormalities of gait and mobility (R26.89);Pain                Time: 1217-1249 OT Time Calculation (min): 32 min Charges:  OT General Charges $OT Visit: 1 Visit OT Evaluation $OT Eval Moderate Complexity: 1 Mod  Nestor Lewandowsky, OTR/L Acute Rehabilitation Services Pager: [phone] Office: [phone]  Malka So 07/08/2018, 2:36 PM

## 2018-07-09 LAB — CBC
HCT: 31.6 % — ABNORMAL LOW (ref 39.0–52.0)
Hemoglobin: 9.6 g/dL — ABNORMAL LOW (ref 13.0–17.0)
MCH: 28.9 pg (ref 26.0–34.0)
MCHC: 30.4 g/dL (ref 30.0–36.0)
MCV: 95.2 fL (ref 80.0–100.0)
Platelets: 197 10*3/uL (ref 150–400)
RBC: 3.32 MIL/uL — AB (ref 4.22–5.81)
RDW: 13.4 % (ref 11.5–15.5)
WBC: 12.2 10*3/uL — ABNORMAL HIGH (ref 4.0–10.5)
nRBC: 0 % (ref 0.0–0.2)

## 2018-07-09 LAB — BASIC METABOLIC PANEL
Anion gap: 8 (ref 5–15)
BUN: 15 mg/dL (ref 8–23)
CO2: 21 mmol/L — ABNORMAL LOW (ref 22–32)
Calcium: 8 mg/dL — ABNORMAL LOW (ref 8.9–10.3)
Chloride: 108 mmol/L (ref 98–111)
Creatinine, Ser: 1.53 mg/dL — ABNORMAL HIGH (ref 0.61–1.24)
GFR calc Af Amer: 50 mL/min — ABNORMAL LOW (ref >60)
GFR calc non Af Amer: 43 mL/min — ABNORMAL LOW (ref >60)
Glucose, Bld: 126 mg/dL — ABNORMAL HIGH (ref 70–99)
Potassium: 4.8 mmol/L (ref 3.5–5.1)
Sodium: 137 mmol/L (ref 135–145)

## 2018-07-09 LAB — GLUCOSE, CAPILLARY
Glucose-Capillary: 119 mg/dL — ABNORMAL HIGH (ref 70–99)
Glucose-Capillary: 120 mg/dL — ABNORMAL HIGH (ref 70–99)
Glucose-Capillary: 136 mg/dL — ABNORMAL HIGH (ref 70–99)
Glucose-Capillary: 123 mg/dL — ABNORMAL HIGH (ref 70–99)
Glucose-Capillary: 131 mg/dL — ABNORMAL HIGH (ref 70–99)

## 2018-07-09 MED ORDER — METOPROLOL TARTRATE 5 MG/5ML IV SOLN
5.0000 mg | Freq: Four times a day (QID) | INTRAVENOUS | Status: DC | PRN
  Administered 2018-07-09 – 2018-07-18 (×5): 5 mg via INTRAVENOUS
  Filled 2018-07-09 (×6): qty 5

## 2018-07-09 MED ORDER — DOCUSATE SODIUM 100 MG PO CAPS
100.0000 mg | ORAL_CAPSULE | Freq: Every day | ORAL | Status: DC
  Administered 2018-07-09 – 2018-07-30 (×18): 100 mg via ORAL
  Filled 2018-07-09 (×19): qty 1

## 2018-07-09 NOTE — Progress Notes (Signed)
Called office to have Dr. Carlis Abbott paged regarding pt HR>130. Awaiting call back.

## 2018-07-09 NOTE — Progress Notes (Signed)
[facility] [place]  Subjective  - Nauseated yesterday with some abdominal distension.  No other complaints.     Objective 129/71 (!) 106 98.1 F (36.7 C) (Oral) 13 95%  Intake/Output Summary (Last 24 hours) at 07/09/2018 0837 Last data filed at 07/09/2018 0700 Gross per 24 hour  Intake 1526.37 ml  Output 1610 ml  Net -83.63 ml    PE General: NAD, resting Abd: distended, midline incision c/d/i Provena vac to both groins Brisk DP signals BLE, motor and sensory intact  Laboratory Lab Results: Recent Labs    07/08/18 0616 07/09/18 0435  WBC 12.8* 12.2*  HGB 9.6* 9.6*  HCT 30.0* 31.6*  PLT 202 197   BMET Recent Labs    07/08/18 0616 07/09/18 0435  NA 137 137  K 4.4 4.8  CL 108 108  CO2 22 21*  GLUCOSE 180* 126*  BUN 17 15  CREATININE 1.49* 1.53*  CALCIUM 7.3* 8.0*    COAG Lab Results  Component Value Date   INR 1.25 07/06/2018   INR 1.04 07/01/2018   INR 1.04 09/06/2017   No results found for: PTT  Assessment/Planning: POD #3 s/p aortobifem  GU:  Creatinine hopefully plateauted 1.49 --> 1.53.  UOP increasing 700 mL yesterday, >1000 mL overnight, will d/c foley today. GI:  NGT previously removed, flatus but distended and some nausea yesterday, will keep NPO today F/E/N:  Keep IVF at 50 CV: tachycardic since admission, patient reports chronic, metoprolol prn, BP stable and Hgb stable ID:  No issues, afebrile Prophylaxis: SQ heparin and protonix Acute blood loss anemia:  Hgb 9.6 --> 9.6, stable PT/OT:  Hopefully OOB and walking today Move to floor Pain control:  Try IV tylenol + PRN, states pain controlled  Marty Heck 07/09/2018 8:37 AM --

## 2018-07-09 NOTE — Plan of Care (Signed)
  Problem: Education: Goal: Knowledge of General Education information will improve Description Including pain rating scale, medication(s)/side effects and non-pharmacologic comfort measures Outcome: Progressing   Problem: Health Behavior/Discharge Planning: Goal: Ability to manage health-related needs will improve Outcome: Progressing   Problem: Clinical Measurements: Goal: Will remain free from infection Outcome: Progressing Goal: Diagnostic test results will improve Outcome: Progressing Goal: Respiratory complications will improve Outcome: Progressing Goal: Cardiovascular complication will be avoided Outcome: Progressing   Problem: Activity: Goal: Risk for activity intolerance will decrease Outcome: Progressing   Problem: Nutrition: Goal: Adequate nutrition will be maintained Outcome: Progressing   Problem: Coping: Goal: Level of anxiety will decrease Outcome: Progressing   Problem: Elimination: Goal: Will not experience complications related to bowel motility Outcome: Progressing Goal: Will not experience complications related to urinary retention Outcome: Progressing   Problem: Pain Managment: Goal: General experience of comfort will improve Outcome: Progressing   Problem: Safety: Goal: Ability to remain free from injury will improve Outcome: Progressing   Problem: Skin Integrity: Goal: Risk for impaired skin integrity will decrease Outcome: Progressing   Problem: Education: Goal: Knowledge of the prescribed therapeutic regimen will improve Outcome: Progressing   Problem: Bowel/Gastric: Goal: Gastrointestinal status for postoperative course will improve Outcome: Progressing   Problem: Cardiac: Goal: Ability to maintain an adequate cardiac output will improve Outcome: Progressing   Problem: Clinical Measurements: Goal: Postoperative complications will be avoided or minimized Outcome: Progressing   Problem: Respiratory: Goal: Respiratory status  will improve Outcome: Progressing   Problem: Skin Integrity: Goal: Demonstration of wound healing without infection will improve Outcome: Progressing   Problem: Urinary Elimination: Goal: Ability to achieve and maintain adequate renal perfusion and functioning will improve Outcome: Progressing   Problem: Clinical Measurements: Goal: Ability to maintain clinical measurements within normal limits will improve Outcome: Not Progressing  Patients HR sustaining 125-130.

## 2018-07-09 NOTE — Progress Notes (Signed)
Paged Dr. Carlis Abbott regarding pt HR >130. Given verbal order for 5mg  metoprolol IV now and q6 if needed for HR>130. Will implement order and continue to monitor. Lajoyce Corners, RN

## 2018-07-10 LAB — TYPE AND SCREEN
ABO/RH(D): O POS
ANTIBODY SCREEN: NEGATIVE
Unit division: 0
Unit division: 0
Unit division: 0
Unit division: 0

## 2018-07-10 LAB — GLUCOSE, CAPILLARY
Glucose-Capillary: 115 mg/dL — ABNORMAL HIGH (ref 70–99)
Glucose-Capillary: 118 mg/dL — ABNORMAL HIGH (ref 70–99)
Glucose-Capillary: 130 mg/dL — ABNORMAL HIGH (ref 70–99)
Glucose-Capillary: 137 mg/dL — ABNORMAL HIGH (ref 70–99)
Glucose-Capillary: 150 mg/dL — ABNORMAL HIGH (ref 70–99)
Glucose-Capillary: 237 mg/dL — ABNORMAL HIGH (ref 70–99)

## 2018-07-10 LAB — BASIC METABOLIC PANEL
Anion gap: 6 (ref 5–15)
BUN: 12 mg/dL (ref 8–23)
CO2: 25 mmol/L (ref 22–32)
Calcium: 8.5 mg/dL — ABNORMAL LOW (ref 8.9–10.3)
Chloride: 108 mmol/L (ref 98–111)
Creatinine, Ser: 1.28 mg/dL — ABNORMAL HIGH (ref 0.61–1.24)
GFR calc Af Amer: >60 mL/min (ref >60)
GFR calc non Af Amer: 54 mL/min — ABNORMAL LOW (ref >60)
Glucose, Bld: 133 mg/dL — ABNORMAL HIGH (ref 70–99)
Potassium: 4.4 mmol/L (ref 3.5–5.1)
SODIUM: 139 mmol/L (ref 135–145)

## 2018-07-10 LAB — BPAM RBC
Blood Product Expiration Date: 202002012359
Blood Product Expiration Date: 202002012359
Blood Product Expiration Date: 202002042359
Blood Product Expiration Date: 202002042359
ISSUE DATE / TIME: 202001080816
ISSUE DATE / TIME: 202001080816
ISSUE DATE / TIME: 202001081148
ISSUE DATE / TIME: 202001081148
UNIT TYPE AND RH: 5100
UNIT TYPE AND RH: 5100
Unit Type and Rh: 5100
Unit Type and Rh: 5100

## 2018-07-10 LAB — CBC
HCT: 29.1 % — ABNORMAL LOW (ref 39.0–52.0)
HEMOGLOBIN: 9.1 g/dL — AB (ref 13.0–17.0)
MCH: 29.1 pg (ref 26.0–34.0)
MCHC: 31.3 g/dL (ref 30.0–36.0)
MCV: 93 fL (ref 80.0–100.0)
Platelets: 231 10*3/uL (ref 150–400)
RBC: 3.13 MIL/uL — AB (ref 4.22–5.81)
RDW: 13.2 % (ref 11.5–15.5)
WBC: 9.6 10*3/uL (ref 4.0–10.5)
nRBC: 0 % (ref 0.0–0.2)

## 2018-07-10 LAB — PHOSPHORUS: Phosphorus: 1.9 mg/dL — ABNORMAL LOW (ref 2.5–4.6)

## 2018-07-10 LAB — MAGNESIUM: MAGNESIUM: 1.8 mg/dL (ref 1.7–2.4)

## 2018-07-10 MED ORDER — METOPROLOL SUCCINATE ER 25 MG PO TB24
25.0000 mg | ORAL_TABLET | Freq: Two times a day (BID) | ORAL | Status: DC
  Administered 2018-07-10 – 2018-07-14 (×9): 25 mg via ORAL
  Filled 2018-07-10 (×9): qty 1

## 2018-07-10 NOTE — Progress Notes (Signed)
[facility] [place] out of ICU yesterday.  Persistent nausea overnight that he states is mildly improved this am.  Remains tachycardic with HR 120-130.   Objective 121/63 (!) 126 97.9 F (36.6 C) (Oral) 10 96%  Intake/Output Summary (Last 24 hours) at 07/10/2018 0907 Last data filed at 07/10/2018 0315 Gross per 24 hour  Intake 905.44 ml  Output 475 ml  Net 430.44 ml    PE General: NAD, resting Abd: distended, midline incision c/d/i Provena vacs to both groins R DP palpable, L DP brisk  Laboratory Lab Results: Recent Labs    07/09/18 0435 07/10/18 0730  WBC 12.2* 9.6  HGB 9.6* 9.1*  HCT 31.6* 29.1*  PLT 197 231   BMET Recent Labs    07/09/18 0435 07/10/18 0730  NA 137 139  K 4.8 4.4  CL 108 108  CO2 21* 25  GLUCOSE 126* 133*  BUN 15 12  CREATININE 1.53* 1.28*  CALCIUM 8.0* 8.5*    COAG Lab Results  Component Value Date   INR 1.25 07/06/2018   INR 1.04 07/01/2018   INR 1.04 09/06/2017   No results found for: PTT  Assessment/Planning: POD #4 s/p aortobifem  GU:  Creatinine continues to improve and now 1.53 --> 1.28, on room air, will hold on diuresis GI:  NGT previously removed end of last week, continues to have flatus but distended and some nausea continues, will keep NPO today, discussed possibility of replacing NG tube and patient states not that uncomfortable to warrant NG F/E/N:  Keep IVF at 50 since not eating CV: tachycardic since admission, patient reports chronic, metoprolol prn, BP stable and Hgb stable, will get EKG today to ensure, appears sinus with PVC's on monitor, will check lytes as well ID:  No issues, afebrile Prophylaxis: SQ heparin and protonix Acute blood loss anemia:  Hgb 9.6 --> 9.1, relatively stable PT/OT:  Hopefully OOB and walking today Pain control:  Try IV tylenol + PRN, states pain controlled  Marty Heck 07/10/2018 9:07 AM --

## 2018-07-11 ENCOUNTER — Inpatient Hospital Stay (HOSPITAL_COMMUNITY): Payer: Medicare Other

## 2018-07-11 LAB — GLUCOSE, CAPILLARY
GLUCOSE-CAPILLARY: 137 mg/dL — AB (ref 70–99)
Glucose-Capillary: 139 mg/dL — ABNORMAL HIGH (ref 70–99)
Glucose-Capillary: 90 mg/dL (ref 70–99)
Glucose-Capillary: <10 mg/dL — CL (ref 70–99)
Glucose-Capillary: 140 mg/dL — ABNORMAL HIGH (ref 70–99)
Glucose-Capillary: 182 mg/dL — ABNORMAL HIGH (ref 70–99)
Glucose-Capillary: 149 mg/dL — ABNORMAL HIGH (ref 70–99)

## 2018-07-11 MED ORDER — FENTANYL CITRATE (PF) 100 MCG/2ML IJ SOLN
25.0000 ug | INTRAMUSCULAR | Status: DC | PRN
  Administered 2018-07-12 – 2018-07-14 (×3): 25 ug via INTRAVENOUS
  Filled 2018-07-11 (×4): qty 2

## 2018-07-11 NOTE — Consult Note (Signed)
[facility] CM Primary Care Navigator  07/11/2018  Christian Bailey January 25, 1941 [id number]    Met withpatientand wife ([name])at the bedsideto identify possible discharge needs.  Patientreports having"both legs in much pain causing difficulty walking" which had led to this admission/ surgery.(right greater than left claudication, underwent aortobifemoral bypass graft, redo right femoral artery exposure, revision of right femoral- popliteal bypass graft, placement of bilateral groin wound VAC)  PatientendorsesDr. Prince Solian with [facility] as hisprimary care provider.   Patient's wifeverbalized usingHarris Counselling psychologist on [employer] and [employer] ([facility]ru [employer]) Mail Order service to obtain medications without difficulty.  Hereportsmanaginghisownmedications at homestraight out of the containers with no problem.  Patient's wifehas been providingtransportation to his doctors' appointments.  Patientlives at home with wife who serves as his primary caregiver.  Anticipateddischarge planishomewith home health services ([facility]) according to patient.  Patientand wifevoiced understanding to call primary care provider's officewhenhereturnshomefor a post discharge follow-up within1- 2weeksor sooner if needs arise.Patient letter (with PCP's contact number) was provided astheirreminder.   Discussed with patientand wife aboutTHN CM services available for health management and resources at home buthe denies any current needs or concerns for now.Patient declined [facility] services offered and states "managing well so far" (mainly DM) with A1C of 5.8 and with close follow-up with primary care provider.  Encouraged patientand wifeto seekreferral from primary care provider to [facility] care management if deemednecessaryand appropriate for services in the nearfuture or when patient changes his mind about needing  services.  [facility] care management information was provided for future needs thathemay have.  Patientwould only opt and verbally agree forEMMI calls to follow-up with hisrecovery at home at this time.  Referral made for [facility] calls after discharge.  Primary care provider's office is listed as providing transition of care (TOC).    For additional questions please contact:  Edwena Felty A. Benny Deutschman, BSN, RN-BC [facility] PRIMARY CARE Navigator Cell: [phone]

## 2018-07-11 NOTE — Progress Notes (Addendum)
[facility] [place]  Subjective  - walked some over the weekend.  Still having abdominal incisional pain especially with coughing.   Objective (!) 150/78 (!) 135 97.8 F (36.6 C) (Oral) (!) 22 92%  Intake/Output Summary (Last 24 hours) at 07/11/2018 0748 Last data filed at 07/11/2018 0406 Gross per 24 hour  Intake 1022.89 ml  Output 1400 ml  Net -377.11 ml    Abdomin distended, but soft to palpation without tenderness laterally r and left.  Incision healing well. Palpable DP B LE.  Tachycardia 125 this am B groins with incisional vacs in place    Assessment/Planning: POD # 5 s/p aortobifem  He states he has passed flatus, but has been consistently nauseated.  No BS sounds to auscultation.  Will maintain NPO for now and discuss with [name].   Ambulating Patent arterial flow with B palpable pedal pulses Cr improved now 1.28 IV fluids at 50 cc/hr UO 1400 last 24 hours  [name] 07/11/2018 7:48 AM --  Laboratory Lab Results: Recent Labs    07/09/18 0435 07/10/18 0730  WBC 12.2* 9.6  HGB 9.6* 9.1*  HCT 31.6* 29.1*  PLT 197 231   BMET Recent Labs    07/09/18 0435 07/10/18 0730  NA 137 139  K 4.8 4.4  CL 108 108  CO2 21* 25  GLUCOSE 126* 133*  BUN 15 12  CREATININE 1.53* 1.28*  CALCIUM 8.0* 8.5*    COAG Lab Results  Component Value Date   INR 1.25 07/06/2018   INR 1.04 07/01/2018   INR 1.04 09/06/2017   No results found for: PTT   Agree with the above GI:  ABd is soft but slightly distended.  He continues to be nauseated despite flatus.  Will check KUB.  ? Related to meds GU:  Good UOP, Cr back to base line Heme:  Hct stable ID:  No active issues Pain:  Is having hallucinations with pain meds, will change to fentanyl.  Alleregy to Percocet Prophylaxis:  SQ hep and protonix PT/OT:  Needs to ambulate 3x in halls today VASC:  Palpable DP bilateral.  Provena in each groin   Wells Alizia Greif

## 2018-07-11 NOTE — Progress Notes (Signed)
Pt becomes disoriented at times. Easily reoriented. Will tell nurse that he is talking to people in the room when no one is there. Patient A&O to self, situation, time and place but needs to be redirected when he sees things that aren't there. Will continue to monitor. Lajoyce Corners, RN

## 2018-07-11 NOTE — Progress Notes (Signed)
Occupational Therapy Treatment Patient Details Name: Christian Bailey MRN: [id number] DOB: [birth year] Today's Date: 07/11/2018    History of present illness Pt s/p aortobifemoral bypass and redo of rt fem pop bypass on 07/06/18. PMH - rt reverse total shoulder, HTN, DM, OA, PAD, lower extremity bypass   OT comments  Pt making progress with functional goals, is very pleasant and cooperative. Min verbal cues required to limit use of UEs during sit - stand, stand - sit transitions. Pt transferred to [facility] with min A for toileting tasks. OT will continue to follow acutely  Follow Up Recommendations  No OT follow up    Equipment Recommendations  None recommended by OT    Recommendations for Other Services      Precautions / Restrictions Precautions Precautions: Fall Restrictions Weight Bearing Restrictions: No       Mobility Bed Mobility               General bed mobility comments: pt in recliner upon arrival  Transfers Overall transfer level: Needs assistance Equipment used: Rolling walker (2 wheeled) Transfers: Sit to/from [facility] Sit to Stand: Min assist Stand pivot transfers: Min assist       General transfer comment: min A to power up, min verbal cues to limit use of UEs during sit - stand, stand - sit transitions    Balance Overall balance assessment: Needs assistance Sitting-balance support: No upper extremity supported;Feet unsupported Sitting balance-Leahy Scale: Fair Sitting balance - Comments: posterior lean during dynamic sitting activity Postural control: Posterior lean Standing balance support: Bilateral upper extremity supported                               ADL either performed or assessed with clinical judgement   ADL Overall ADL's : Needs assistance/impaired     Grooming: Set up;Sitting;Wash/dry face;Wash/dry hands       Lower Body Bathing: Sit to/from stand;Moderate assistance Lower Body Bathing Details  (indicate cue type and reason): siumlated     Lower Body Dressing: Maximal assistance;Sit to/from stand   Toilet Transfer: Minimal assistance;Ambulation;RW;BSC   Toileting- Water quality scientist and Hygiene: Sit to/from stand;Moderate assistance       Functional mobility during ADLs: Minimal assistance;Rolling walker General ADL Comments: pt limited by abdominal pain     Vision Baseline Vision/History: Wears glasses;Macular Degeneration Wears Glasses: At all times Patient Visual Report: No change from baseline     Perception     Praxis      Cognition Arousal/Alertness: Awake/alert Behavior During Therapy: WFL for tasks assessed/performed Overall Cognitive Status: Within Functional Limits for tasks assessed                                 General Comments: min verbal cues to limit use of UEs during sit - stand, stand - sit transitions        Exercises     Shoulder Instructions       General Comments      Pertinent Vitals/ Pain       Pain Assessment: 0-10 Pain Score: 6  Pain Location:  abdomen incision Pain Descriptors / Indicators: Operative site guarding;Sore Pain Intervention(s): Monitored during session;Repositioned  Home Living  Prior Functioning/Environment              Frequency  Min 2X/week        Progress Toward Goals  OT Goals(current goals can now be found in the care plan section)  Progress towards OT goals: Progressing toward goals     Plan Discharge plan remains appropriate    Co-evaluation                 AM-PAC OT "6 Clicks" Daily Activity     Outcome Measure   Help from another person eating meals?: None Help from another person taking care of personal grooming?: A Little Help from another person toileting, which includes using toliet, bedpan, or urinal?: A Little Help from another person bathing (including washing, rinsing, drying)?: A  Lot Help from another person to put on and taking off regular upper body clothing?: A Little Help from another person to put on and taking off regular lower body clothing?: A Lot 6 Click Score: 17    End of Session Equipment Utilized During Treatment: Gait belt;Rolling walker  OT Visit Diagnosis: Unsteadiness on feet (R26.81);Other abnormalities of gait and mobility (R26.89);Pain Pain - part of body: (abdomen)   Activity Tolerance Patient tolerated treatment well   Patient Left in chair;with call bell/phone within reach;with chair alarm set;with nursing/sitter in room   Nurse Communication          Time: 4818-5909 OT Time Calculation (min): 19 min  Charges: OT General Charges $OT Visit: 1 Visit OT Treatments $Self Care/Home Management : 8-22 mins    Britt Bottom 07/11/2018, 2:42 PM

## 2018-07-11 NOTE — Progress Notes (Signed)
Physical Therapy Treatment Patient Details Name: Christian Bailey MRN: [id number] DOB: 05/20/1941 Today's Date: 07/11/2018    History of Present Illness Pt s/p aortobifemoral bypass and redo of rt fem pop bypass on 07/06/18. PMH - rt reverse total shoulder, HTN, DM, OA, PAD, lower extremity bypass    PT Comments    Pt making steady progress with mobility. Seemed a little less sharp cognitively today. Expect will need some supervision/assist from family at [place].    Follow Up Recommendations  Home health PT;Supervision for mobility/OOB     Equipment Recommendations  Other (comment)(rollator)    Recommendations for Other Services       Precautions / Restrictions Precautions Precautions: Fall;Other (comment)(wound vac) Restrictions Weight Bearing Restrictions: No    Mobility  Bed Mobility Overal bed mobility: Needs Assistance Bed Mobility: Supine to Sit     Supine to sit: Min assist     General bed mobility comments: Assist to elevate trunk into sitting  Transfers Overall transfer level: Needs assistance Equipment used: 4-wheeled walker Transfers: Sit to/from Stand Sit to Stand: Min assist Stand pivot transfers: Min assist       General transfer comment: Assist to bring hips up and for balance  Ambulation/Gait Ambulation/Gait assistance: Min assist Gait Distance (Feet): 100 Feet(x 2) Assistive device: 4-wheeled walker Gait Pattern/deviations: Step-through pattern;Decreased stride length Gait velocity: decreased Gait velocity interpretation: <1.8 ft/sec, indicate of risk for recurrent falls General Gait Details: Assist for balance. Pt required 1 sitting rest break on rollator. RHR - 110's HR with amb 120-130's   Stairs             Wheelchair Mobility    Modified Rankin (Stroke Patients Only)       Balance Overall balance assessment: Needs assistance Sitting-balance support: No upper extremity supported;Feet unsupported Sitting balance-Leahy Scale:  Fair Sitting balance - Comments: posterior lean during dynamic sitting activity Postural control: Posterior lean Standing balance support: Bilateral upper extremity supported Standing balance-Leahy Scale: Poor Standing balance comment: rollator and min guard for static standing                            Cognition Arousal/Alertness: Awake/alert Behavior During Therapy: Restless Overall Cognitive Status: Impaired/Different from baseline Area of Impairment: Memory                     Memory: Decreased short-term memory         General Comments: Didn't seem as sharp cognitively as when I saw him last week      Exercises      General Comments        Pertinent Vitals/Pain Pain Assessment: 0-10 Pain Score: 6  Pain Location: abdominal incision Pain Descriptors / Indicators: Operative site guarding;Grimacing Pain Intervention(s): Monitored during session    Home Living                      Prior Function            PT Goals (current goals can now be found in the care plan section) Progress towards PT goals: Progressing toward goals    Frequency    Min 3X/week      PT Plan Current plan remains appropriate    Co-evaluation PT/OT/SLP Co-Evaluation/Treatment: Yes            AM-PAC PT "6 Clicks" Mobility   Outcome Measure  Help needed turning from your back to your side while  in a flat bed without using bedrails?: A Little Help needed moving from lying on your back to sitting on the side of a flat bed without using bedrails?: A Little Help needed moving to and from a bed to a chair (including a wheelchair)?: A Little Help needed standing up from a chair using your arms (e.g., wheelchair or bedside chair)?: A Little Help needed to walk in hospital room?: A Little Help needed climbing 3-5 steps with a railing? : A Lot 6 Click Score: 17    End of Session Equipment Utilized During Treatment: Gait belt Activity Tolerance: Patient  tolerated treatment well Patient left: in chair;with call bell/phone within reach;with chair alarm set Nurse Communication: Mobility status PT Visit Diagnosis: Other abnormalities of gait and mobility (R26.89);Pain Pain - part of body: (abdomen)     Time: 6270-3500 PT Time Calculation (min) (ACUTE ONLY): 42 min  Charges:  $Gait Training: 38-52 mins                     [place] Pager [phone] Office [place] 07/11/2018, 4:51 PM

## 2018-07-11 NOTE — Plan of Care (Signed)

## 2018-07-12 LAB — GLUCOSE, CAPILLARY
GLUCOSE-CAPILLARY: 154 mg/dL — AB (ref 70–99)
Glucose-Capillary: 133 mg/dL — ABNORMAL HIGH (ref 70–99)
Glucose-Capillary: 143 mg/dL — ABNORMAL HIGH (ref 70–99)
Glucose-Capillary: 150 mg/dL — ABNORMAL HIGH (ref 70–99)
Glucose-Capillary: 150 mg/dL — ABNORMAL HIGH (ref 70–99)
Glucose-Capillary: 178 mg/dL — ABNORMAL HIGH (ref 70–99)

## 2018-07-12 MED ORDER — CLOPIDOGREL BISULFATE 75 MG PO TABS
75.0000 mg | ORAL_TABLET | Freq: Every day | ORAL | Status: DC
  Administered 2018-07-12 – 2018-07-30 (×18): 75 mg via ORAL
  Filled 2018-07-12 (×18): qty 1

## 2018-07-12 MED ORDER — LISINOPRIL 5 MG PO TABS
5.0000 mg | ORAL_TABLET | Freq: Two times a day (BID) | ORAL | Status: DC
  Administered 2018-07-12 – 2018-07-14 (×5): 5 mg via ORAL
  Filled 2018-07-12 (×5): qty 1

## 2018-07-12 MED ORDER — METFORMIN HCL 500 MG PO TABS
1000.0000 mg | ORAL_TABLET | Freq: Two times a day (BID) | ORAL | Status: DC
  Administered 2018-07-12 – 2018-07-15 (×7): 1000 mg via ORAL
  Filled 2018-07-12 (×7): qty 2

## 2018-07-12 NOTE — Plan of Care (Signed)

## 2018-07-12 NOTE — Progress Notes (Addendum)
[facility] [place]  Subjective  - Doing better, still complaining of incisional pain.  No other new complaints.    Objective (!) 150/81 (!) 58 97.7 F (36.5 C) (Oral) 16 96%  Intake/Output Summary (Last 24 hours) at 07/12/2018 0728 Last data filed at 07/12/2018 0441 Gross per 24 hour  Intake -  Output 1300 ml  Net -1300 ml    Palpable DP B LE Abdomin with positive BS, well healing incision. Groins soft with incisional vasc in place.  Out put 50 cc Heart tachycardia 120's-130's.  KUB IMPRESSION: Moderate colonic stool burden without evidence of enteric Obstruction.   Assessment/Planning: POD # 6  s/p aortobifem  Positive BS and passed flatus will start clear liquids slowly today Will restart home medications as well Continue ambulation and mobility Will recheck labs in the am  [name] 07/12/2018 7:28 AM --  Laboratory Lab Results: Recent Labs    07/10/18 0730  WBC 9.6  HGB 9.1*  HCT 29.1*  PLT 231   BMET Recent Labs    07/10/18 0730  NA 139  K 4.4  CL 108  CO2 25  GLUCOSE 133*  BUN 12  CREATININE 1.28*  CALCIUM 8.5*    COAG Lab Results  Component Value Date   INR 1.25 07/06/2018   INR 1.04 07/01/2018   INR 1.04 09/06/2017   No results found for: PTT  I agree with the above.  I have seen and evaluated the patient.  He continues to complain of nausea.  His abdomen is slightly distended.  He did tolerate oral intake today.  Continue with mobilization.  We will repeat labs tomorrow.  [name]

## 2018-07-12 NOTE — Progress Notes (Signed)
Physical Therapy Treatment Patient Details Name: Christian Bailey MRN: [id number] DOB: [birth year] Today's Date: 07/12/2018    History of Present Illness Pt s/p aortobifemoral bypass and redo of rt fem pop bypass on 07/06/18. PMH - rt reverse total shoulder, HTN, DM, OA, PAD, lower extremity bypass    PT Comments    Pt making slow, steady progress. Continues to have high HR at rest (120) and with activity (140's).    Follow Up Recommendations  Home health PT;Supervision for mobility/OOB     Equipment Recommendations  Other (comment)(rollator)    Recommendations for Other Services       Precautions / Restrictions Precautions Precautions: Fall;Other (comment)(wound vac) Restrictions Weight Bearing Restrictions: No    Mobility  Bed Mobility Overal bed mobility: Needs Assistance Bed Mobility: Rolling;Sidelying to Sit Rolling: Supervision Sidelying to sit: Supervision;HOB elevated       General bed mobility comments: Incr time and use of rail but no physical assist  Transfers Overall transfer level: Needs assistance Equipment used: 4-wheeled walker Transfers: Sit to/from Stand Sit to Stand: Min guard         General transfer comment: Assist for safety and lines  Ambulation/Gait Ambulation/Gait assistance: Min guard Gait Distance (Feet): 100 Feet(x 2) Assistive device: 4-wheeled walker Gait Pattern/deviations: Step-through pattern;Decreased stride length;Trunk flexed Gait velocity: decreased Gait velocity interpretation: 1.31 - 2.62 ft/sec, indicative of limited community ambulator General Gait Details: Assist for safety and lines. Pt with HR to the 140's with amb. Resting HR 120. One sitting rest break on recliner   Stairs             Wheelchair Mobility    Modified Rankin (Stroke Patients Only)       Balance Overall balance assessment: Needs assistance Sitting-balance support: No upper extremity supported;Feet unsupported Sitting balance-Leahy Scale:  Fair     Standing balance support: Bilateral upper extremity supported Standing balance-Leahy Scale: Poor Standing balance comment: rollator and min guard for static standing                            Cognition Arousal/Alertness: Awake/alert Behavior During Therapy: WFL for tasks assessed/performed Overall Cognitive Status: Within Functional Limits for tasks assessed                                        Exercises      General Comments        Pertinent Vitals/Pain Pain Assessment: 0-10 Faces Pain Scale: Hurts even more Pain Location: abdominal incision with transitional movments Pain Descriptors / Indicators: Operative site guarding;Grimacing Pain Intervention(s): Monitored during session;Repositioned    Home Living                      Prior Function            PT Goals (current goals can now be found in the care plan section) Progress towards PT goals: Progressing toward goals    Frequency    Min 3X/week      PT Plan Current plan remains appropriate    Co-evaluation PT/OT/SLP Co-Evaluation/Treatment: Yes            AM-PAC PT "6 Clicks" Mobility   Outcome Measure  Help needed turning from your back to your side while in a flat bed without using bedrails?: A Little Help needed moving from lying on your  back to sitting on the side of a flat bed without using bedrails?: A Little Help needed moving to and from a bed to a chair (including a wheelchair)?: A Little Help needed standing up from a chair using your arms (e.g., wheelchair or bedside chair)?: A Little Help needed to walk in hospital room?: A Little Help needed climbing 3-5 steps with a railing? : A Lot 6 Click Score: 17    End of Session   Activity Tolerance: Patient tolerated treatment well Patient left: in chair;with call bell/phone within reach;with chair alarm set;with family/visitor present Nurse Communication: Mobility status;Other (comment)(high  HR) PT Visit Diagnosis: Other abnormalities of gait and mobility (R26.89);Pain Pain - part of body: (abdomen)     Time: 8381-8403 PT Time Calculation (min) (ACUTE ONLY): 26 min  Charges:  $Gait Training: 23-37 mins                     [place] Pager [phone] Office [place] 07/12/2018, 12:05 PM

## 2018-07-13 ENCOUNTER — Inpatient Hospital Stay (HOSPITAL_COMMUNITY): Payer: Medicare Other

## 2018-07-13 LAB — BASIC METABOLIC PANEL
Anion gap: 11 (ref 5–15)
BUN: 14 mg/dL (ref 8–23)
CALCIUM: 8.1 mg/dL — AB (ref 8.9–10.3)
CO2: 26 mmol/L (ref 22–32)
Chloride: 101 mmol/L (ref 98–111)
Creatinine, Ser: 1.22 mg/dL (ref 0.61–1.24)
GFR calc Af Amer: >60 mL/min (ref >60)
GFR calc non Af Amer: 57 mL/min — ABNORMAL LOW (ref >60)
Glucose, Bld: 180 mg/dL — ABNORMAL HIGH (ref 70–99)
Potassium: 4.4 mmol/L (ref 3.5–5.1)
Sodium: 138 mmol/L (ref 135–145)

## 2018-07-13 LAB — GLUCOSE, CAPILLARY
Glucose-Capillary: 115 mg/dL — ABNORMAL HIGH (ref 70–99)
Glucose-Capillary: 126 mg/dL — ABNORMAL HIGH (ref 70–99)
Glucose-Capillary: 131 mg/dL — ABNORMAL HIGH (ref 70–99)
Glucose-Capillary: 146 mg/dL — ABNORMAL HIGH (ref 70–99)
Glucose-Capillary: 165 mg/dL — ABNORMAL HIGH (ref 70–99)
Glucose-Capillary: 176 mg/dL — ABNORMAL HIGH (ref 70–99)

## 2018-07-13 LAB — CBC
HCT: 27.1 % — ABNORMAL LOW (ref 39.0–52.0)
Hemoglobin: 8.6 g/dL — ABNORMAL LOW (ref 13.0–17.0)
MCH: 28.8 pg (ref 26.0–34.0)
MCHC: 31.7 g/dL (ref 30.0–36.0)
MCV: 90.6 fL (ref 80.0–100.0)
PLATELETS: 318 10*3/uL (ref 150–400)
RBC: 2.99 MIL/uL — ABNORMAL LOW (ref 4.22–5.81)
RDW: 12.8 % (ref 11.5–15.5)
WBC: 8.6 10*3/uL (ref 4.0–10.5)
nRBC: 0 % (ref 0.0–0.2)

## 2018-07-13 MED ORDER — TRAMADOL HCL 50 MG PO TABS
50.0000 mg | ORAL_TABLET | Freq: Four times a day (QID) | ORAL | Status: DC
  Administered 2018-07-13 – 2018-07-30 (×52): 50 mg via ORAL
  Filled 2018-07-13 (×55): qty 1

## 2018-07-13 NOTE — Progress Notes (Signed)
Radiology called to report results for CT abdomen. Gwenette Greet, [place] paged and notified that results available. Amanda Cockayne, RN

## 2018-07-13 NOTE — Progress Notes (Addendum)
Occupational Therapy Treatment Patient Details Name: Christian Bailey MRN: [id number] DOB: 01-Mar-1941 Today's Date: 07/13/2018    History of present illness Pt s/p aortobifemoral bypass and redo of rt fem pop bypass on 07/06/18. PMH - rt reverse total shoulder, HTN, DM, OA, PAD, lower extremity bypass   OT comments  Pt making progress with functional goals and participated simple grooming tasks with min A for safety standing at RW, LB ADLs seated at EOB and transfer to BSC min guard A for toileting tasks. Pt very pleasant and cooperative. HR 11-113 throughout activity and at rest.  OT will continue to follow acutely  Follow Up Recommendations  Home health OT    Equipment Recommendations  None recommended by OT    Recommendations for Other Services      Precautions / Restrictions Precautions Precautions: Fall;Other (comment)(wound vac) Restrictions Weight Bearing Restrictions: No       Mobility Bed Mobility Overal bed mobility: Needs Assistance Bed Mobility: Rolling;Sidelying to Sit Rolling: Supervision Sidelying to sit: Supervision;HOB elevated Supine to sit: Supervision     General bed mobility comments: Incr time and use of rail but no physical assist  Transfers Overall transfer level: Needs assistance Equipment used: 4-wheeled walker Transfers: Sit to/from Stand Sit to Stand: Min guard         General transfer comment: Assist for safety and lines    Balance Overall balance assessment: Needs assistance Sitting-balance support: No upper extremity supported;Feet unsupported Sitting balance-Leahy Scale: Fair Sitting balance - Comments: posterior lean during dynamic sitting activity Postural control: Posterior lean Standing balance support: Bilateral upper extremity supported;During functional activity Standing balance-Leahy Scale: Poor                             ADL either performed or assessed with clinical judgement   ADL Overall ADL's : Needs  assistance/impaired Eating/Feeding: Independent;Sitting   Grooming: Wash/dry face;Wash/dry hands;Minimal assistance;Standing       Lower Body Bathing: Sit to/from stand;Minimal assistance       Lower Body Dressing: Sit to/from stand;Moderate assistance   Toilet Transfer: Ambulation;RW;BSC;Min guard   Toileting- Water quality scientist and Hygiene: Sit to/from stand;Minimal assistance       Functional mobility during ADLs: Rolling walker;Min guard;Cueing for safety       Vision Baseline Vision/History: Wears glasses;Macular Degeneration Wears Glasses: At all times Patient Visual Report: No change from baseline     Perception     Praxis      Cognition Arousal/Alertness: Awake/alert Behavior During Therapy: WFL for tasks assessed/performed Overall Cognitive Status: Within Functional Limits for tasks assessed Area of Impairment: Memory                     Memory: Decreased short-term memory                  Exercises     Shoulder Instructions       General Comments      Pertinent Vitals/ Pain       Pain Assessment: 0-10 Pain Score: 5  Pain Location: abdominal incision with transitional movments Pain Descriptors / Indicators: Operative site guarding;Grimacing Pain Intervention(s): Monitored during session;Repositioned  Home Living                                          Prior Functioning/Environment  Frequency  Min 2X/week        Progress Toward Goals  OT Goals(current goals can now be found in the care plan section)  Progress towards OT goals: Progressing toward goals  Acute Rehab OT Goals Patient Stated Goal: return home  Plan Discharge plan remains appropriate    Co-evaluation                 AM-PAC OT "6 Clicks" Daily Activity     Outcome Measure   Help from another person eating meals?: None Help from another person taking care of personal grooming?: A Little Help from another  person toileting, which includes using toliet, bedpan, or urinal?: A Little Help from another person bathing (including washing, rinsing, drying)?: A Lot Help from another person to put on and taking off regular upper body clothing?: A Little Help from another person to put on and taking off regular lower body clothing?: A Lot 6 Click Score: 17    End of Session Equipment Utilized During Treatment: Gait belt;Rolling walker;Other (comment)(BSC)  OT Visit Diagnosis: Unsteadiness on feet (R26.81);Other abnormalities of gait and mobility (R26.89);Pain   Activity Tolerance Patient tolerated treatment well   Patient Left in chair;with call bell/phone within reach;with family/visitor present;with chair alarm set   Nurse Communication          Time: 7867-5449 OT Time Calculation (min): 28 min  Charges: OT General Charges $OT Visit: 1 Visit OT Treatments $Self Care/Home Management : 8-22 mins $Therapeutic Activity: 8-22 mins     Britt Bottom 07/13/2018, 1:31 PM

## 2018-07-13 NOTE — Progress Notes (Addendum)
[facility] [place]  Subjective  - Increased pain left LQ, with abdominal incision pain slowly improving.   Objective (!) 144/84 (!) 114 98.1 F (36.7 C) (Oral) 20 98%  Intake/Output Summary (Last 24 hours) at 07/13/2018 0725 Last data filed at 07/12/2018 2115 Gross per 24 hour  Intake 480 ml  Output 1076 ml  Net -596 ml    Palpable DP pulse B Abdomin tender left LE, left groin without hematoma.  Wound vac in place. Right groin without hematoma, wound vac in place.  Vac output 150 cc total. Heart Tachycardia Lungs non labored breathing  Assessment/Planning: POD # 7 s/p aortobifem  He has tolerated clears without nausea or vomiting. Left LQ pain has increased.   HGB decreasing daily post op Blood loss intraoperative 1425, received 600 cell saver and 2 units PRBC.  07/07/2018 HGB 14.2, now 8.6.  One thought would be to get a CT.  He has a history of anemia and GI bleed.  He has not had a BM since surgery and some of his pain could be gas and stool.    I will discuss this with [name].    Christian Bailey 07/13/2018 7:25 AM --  Laboratory Lab Results: Recent Labs    07/10/18 0730 07/13/18 0325  WBC 9.6 8.6  HGB 9.1* 8.6*  HCT 29.1* 27.1*  PLT 231 318   BMET Recent Labs    07/10/18 0730 07/13/18 0325  NA 139 138  K 4.4 4.4  CL 108 101  CO2 25 26  GLUCOSE 133* 180*  BUN 12 14  CREATININE 1.28* 1.22  CALCIUM 8.5* 8.1*    COAG Lab Results  Component Value Date   INR 1.25 07/06/2018   INR 1.04 07/01/2018   INR 1.04 09/06/2017   No results found for: PTT   AGree with above.  CT shows hematoma along left limb of graft.  WIll monitor with serial CBC.  May explain current left abdomen pain. Continue to mobilize  [employer]

## 2018-07-14 LAB — GLUCOSE, CAPILLARY
Glucose-Capillary: 106 mg/dL — ABNORMAL HIGH (ref 70–99)
Glucose-Capillary: 107 mg/dL — ABNORMAL HIGH (ref 70–99)
Glucose-Capillary: 107 mg/dL — ABNORMAL HIGH (ref 70–99)
Glucose-Capillary: 112 mg/dL — ABNORMAL HIGH (ref 70–99)
Glucose-Capillary: 130 mg/dL — ABNORMAL HIGH (ref 70–99)
Glucose-Capillary: 133 mg/dL — ABNORMAL HIGH (ref 70–99)
Glucose-Capillary: 154 mg/dL — ABNORMAL HIGH (ref 70–99)
Glucose-Capillary: 87 mg/dL (ref 70–99)

## 2018-07-14 LAB — BASIC METABOLIC PANEL
Anion gap: 8 (ref 5–15)
BUN: 20 mg/dL (ref 8–23)
CALCIUM: 7.8 mg/dL — AB (ref 8.9–10.3)
CO2: 25 mmol/L (ref 22–32)
Chloride: 98 mmol/L (ref 98–111)
Creatinine, Ser: 2.04 mg/dL — ABNORMAL HIGH (ref 0.61–1.24)
GFR calc Af Amer: 35 mL/min — ABNORMAL LOW (ref >60)
GFR calc non Af Amer: 31 mL/min — ABNORMAL LOW (ref >60)
Glucose, Bld: 141 mg/dL — ABNORMAL HIGH (ref 70–99)
Potassium: 3.9 mmol/L (ref 3.5–5.1)
Sodium: 131 mmol/L — ABNORMAL LOW (ref 135–145)

## 2018-07-14 LAB — CBC
HCT: 22.2 % — ABNORMAL LOW (ref 39.0–52.0)
Hemoglobin: 7.1 g/dL — ABNORMAL LOW (ref 13.0–17.0)
MCH: 29 pg (ref 26.0–34.0)
MCHC: 32 g/dL (ref 30.0–36.0)
MCV: 90.6 fL (ref 80.0–100.0)
Platelets: 366 10*3/uL (ref 150–400)
RBC: 2.45 MIL/uL — ABNORMAL LOW (ref 4.22–5.81)
RDW: 13.1 % (ref 11.5–15.5)
WBC: 10.5 10*3/uL (ref 4.0–10.5)
nRBC: 0 % (ref 0.0–0.2)

## 2018-07-14 LAB — PREPARE RBC (CROSSMATCH)

## 2018-07-14 LAB — HEMOGLOBIN AND HEMATOCRIT, BLOOD
HCT: 25.8 % — ABNORMAL LOW (ref 39.0–52.0)
Hemoglobin: 8.1 g/dL — ABNORMAL LOW (ref 13.0–17.0)

## 2018-07-14 MED ORDER — FUROSEMIDE 10 MG/ML IJ SOLN
20.0000 mg | Freq: Once | INTRAMUSCULAR | Status: AC
  Administered 2018-07-14: 20 mg via INTRAVENOUS
  Filled 2018-07-14: qty 2

## 2018-07-14 MED ORDER — METOPROLOL SUCCINATE ER 25 MG PO TB24
25.0000 mg | ORAL_TABLET | Freq: Two times a day (BID) | ORAL | Status: DC
  Administered 2018-07-15 – 2018-07-16 (×4): 25 mg via ORAL
  Filled 2018-07-14 (×4): qty 1

## 2018-07-14 MED ORDER — SODIUM CHLORIDE 0.9% IV SOLUTION
Freq: Once | INTRAVENOUS | Status: AC
  Administered 2018-07-14: 11:00:00 via INTRAVENOUS

## 2018-07-14 NOTE — Progress Notes (Signed)
Physical Therapy Treatment Patient Details Name: Christian Bailey MRN: [id number] DOB: 1940/09/25 Today's Date: 07/14/2018    History of Present Illness Pt s/p aortobifemoral bypass and redo of rt fem pop bypass on 07/06/18. PMH - rt reverse total shoulder, HTN, DM, OA, PAD, lower extremity bypass    PT Comments    Pt much more interactive and engaged today. Improving with mobility. HR continues to be high (130's) with gait.    Follow Up Recommendations  Home health PT;Supervision for mobility/OOB     Equipment Recommendations  Other (comment)(rollator)    Recommendations for Other Services       Precautions / Restrictions Precautions Precautions: Fall;Other (comment)(wound vac) Restrictions Weight Bearing Restrictions: No    Mobility  Bed Mobility Overal bed mobility: Needs Assistance Bed Mobility: Rolling;Sidelying to Sit;Sit to Sidelying Rolling: Supervision Sidelying to sit: Supervision;HOB elevated     Sit to sidelying: Min assist General bed mobility comments: Incr time and use of rail but no physical assist to come to sitting. Assist to bring feet back up into be returning to sidelying  Transfers Overall transfer level: Needs assistance Equipment used: 4-wheeled walker Transfers: Sit to/from Stand Sit to Stand: Min guard         General transfer comment: Assist for safety and lines  Ambulation/Gait Ambulation/Gait assistance: Min guard Gait Distance (Feet): 225 Feet Assistive device: 4-wheeled walker Gait Pattern/deviations: Step-through pattern;Decreased stride length;Trunk flexed Gait velocity: decreased Gait velocity interpretation: 1.31 - 2.62 ft/sec, indicative of limited community ambulator General Gait Details: Assist for safety and lines. Pt with HR to the 130's with amb.  2 standing rest breaks but didn't have to sit   Stairs             Wheelchair Mobility    Modified Rankin (Stroke Patients Only)       Balance Overall balance  assessment: Needs assistance Sitting-balance support: No upper extremity supported;Feet unsupported Sitting balance-Leahy Scale: Fair     Standing balance support: Bilateral upper extremity supported Standing balance-Leahy Scale: Poor Standing balance comment: rollator and min guard for static standing                            Cognition Arousal/Alertness: Awake/alert Behavior During Therapy: WFL for tasks assessed/performed Overall Cognitive Status: Within Functional Limits for tasks assessed                                        Exercises      General Comments        Pertinent Vitals/Pain Pain Assessment: 0-10 Pain Score: 4  Pain Location: abdominal incision with transitional movments Pain Descriptors / Indicators: Sore Pain Intervention(s): Monitored during session    Home Living                      Prior Function            PT Goals (current goals can now be found in the care plan section) Progress towards PT goals: Progressing toward goals    Frequency    Min 3X/week      PT Plan Current plan remains appropriate    Co-evaluation              AM-PAC PT "6 Clicks" Mobility   Outcome Measure  Help needed turning from your back to your side while  in a flat bed without using bedrails?: A Little Help needed moving from lying on your back to sitting on the side of a flat bed without using bedrails?: A Little Help needed moving to and from a bed to a chair (including a wheelchair)?: A Little Help needed standing up from a chair using your arms (e.g., wheelchair or bedside chair)?: A Little Help needed to walk in hospital room?: A Little Help needed climbing 3-5 steps with a railing? : A Lot 6 Click Score: 17    End of Session Equipment Utilized During Treatment: Gait belt Activity Tolerance: Patient tolerated treatment well Patient left: with call bell/phone within reach;in bed;with bed alarm set(sitting  EOB) Nurse Communication: Mobility status;Other (comment)(high HR) PT Visit Diagnosis: Other abnormalities of gait and mobility (R26.89)     Time: 9379-0240 PT Time Calculation (min) (ACUTE ONLY): 28 min  Charges:  $Gait Training: 23-37 mins                     [place] Pager [phone] Office [place] 07/14/2018, 4:56 PM

## 2018-07-14 NOTE — Progress Notes (Signed)
Spoke with Rhyne S. PA. -Will d/c lisinopril (SCr up) and hold metoprolol until 07/15/18 -BMET ordered for am  Hildred Laser, PharmD Clinical Pharmacist **Pharmacist phone directory can now be found on [email or web address] ([email or web address]).  Listed under [employer].

## 2018-07-14 NOTE — Progress Notes (Signed)
   07/14/18 1200 07/14/18 1221  Vitals  BP (!) 91/57 (!) 89/60  MAP (mmHg) 69 69  ECG Heart Rate 95 91  Notified Collins PA of patient's VS trend, patient feels tired, but denies any feelings of lightheadedness, dizziness or any further symptoms. Received orders for 12 lead EKG, and will continue to monitor.

## 2018-07-14 NOTE — Progress Notes (Addendum)
[facility]  Subjective  - Feeling a little better.  Had BM x 2.   Objective 116/72 92 97.9 F (36.6 C) (Oral) 14 98% No intake or output data in the 24 hours ending 07/14/18 0736  Palpable DP B LE Groins with incisional vacs Abdomin distended, + BS, NTTP Heart Tachy cardia Lungs non labored breathing  CT Abd/Pelvis 10.0 x 10.0 x 6.1 by 12.9 cm hematoma left groin IMPRESSION: Postoperative fluid collections adjacent to the aortobifemoral bypass graft bilaterally as described above. The largest of these lies along the course of the left limb with increased attenuation identified consistent with hematoma.  Mild inflammatory changes surrounding the gallbladder. Ultrasound may be helpful for further evaluation.  Diverticulosis without diverticulitis.   Assessment/Planning: POD # 8 s/p aortobifem  The CT scan shows a left limb hematoma that has likely been present since surgical completion.    He has no evidence of hematoma at the left groin incision site.  He did have a BM x 2 yesterday and his left LQ pain has dissipated.   Tolerating clears, no N/V Abdomin NTTP HGB decreasing daily post op Blood loss intraoperative 1425, received 600 cell saver and 2 units PRBC.   HGB 7.1 we will transfuse 1 unit PRBC today.  With decreased in HGB his Cr has elevated to 2.0.  Will continue to monitor. Lasix x 1 dose post transfusion.  KVO IVF. Continue to mobilize patient  [name] 07/14/2018 7:36 AM --  Laboratory Lab Results: Recent Labs    07/13/18 0325 07/14/18 0438  WBC 8.6 10.5  HGB 8.6* 7.1*  HCT 27.1* 22.2*  PLT 318 366   BMET Recent Labs    07/13/18 0325 07/14/18 0438  NA 138 131*  K 4.4 3.9  CL 101 98  CO2 26 25  GLUCOSE 180* 141*  BUN 14 20  CREATININE 1.22 2.04*  CALCIUM 8.1* 7.8*    COAG Lab Results  Component Value Date   INR 1.25 07/06/2018   INR 1.04 07/01/2018   INR 1.04 09/06/2017   No  results found for: PTT  Agree with the above.  Hematoma on CT scan.  Transfuse 1 u Prbc.  Looking better.  Advance diet  [name]

## 2018-07-14 NOTE — Plan of Care (Signed)
  Problem: Health Behavior/Discharge Planning: Goal: Ability to manage health-related needs will improve Outcome: Progressing   Problem: Clinical Measurements: Goal: Ability to maintain clinical measurements within normal limits will improve Outcome: Progressing Goal: Will remain free from infection Outcome: Progressing Goal: Diagnostic test results will improve Outcome: Progressing Goal: Respiratory complications will improve Outcome: Progressing Goal: Cardiovascular complication will be avoided Outcome: Progressing   Problem: Activity: Goal: Risk for activity intolerance will decrease Outcome: Progressing   Problem: Coping: Goal: Level of anxiety will decrease Outcome: Progressing   Problem: Elimination: Goal: Will not experience complications related to bowel motility Outcome: Progressing Goal: Will not experience complications related to urinary retention Outcome: Progressing   Problem: Pain Managment: Goal: General experience of comfort will improve Outcome: Progressing   Problem: Education: Goal: Knowledge of the prescribed therapeutic regimen will improve Outcome: Progressing

## 2018-07-14 NOTE — Progress Notes (Addendum)
Occupational Therapy Treatment Patient Details Name: Christian Bailey MRN: [id number] DOB: [birth year] Today's Date: 07/14/2018    History of present illness Pt s/p aortobifemoral bypass and redo of rt fem pop bypass on 07/06/18. PMH - rt reverse total shoulder, HTN, DM, OA, PAD, lower extremity bypass   OT comments  Pt making progress with functional goals and reports that he feels he's making progress. Pt stood at [facility] for RN to check wound vac and for grooming x 5 minutes , toileting at [facility] and  participated in simulated LB ADL tasks mod - min A/ Pt very talkative and jovial this session. Pt with BP of 91/67 and HR 99-106 throughout activity.  OT will continue to follow acutely  Follow Up Recommendations  Home health OT    Equipment Recommendations  Other (comment)(reacher, LH bath sponge)    Recommendations for Other Services      Precautions / Restrictions Precautions Precautions: Fall;Other (comment) Precaution Comments: wound vac Restrictions Weight Bearing Restrictions: No       Mobility Bed Mobility               General bed mobility comments: pt sitting EOB with RN present upon arrival  Transfers Overall transfer level: Needs assistance Equipment used: 4-wheeled walker Transfers: Sit to/from Stand Sit to Stand: Min guard         General transfer comment: Assist for safety and lines    Balance Overall balance assessment: Needs assistance Sitting-balance support: No upper extremity supported;Feet unsupported Sitting balance-Leahy Scale: Fair     Standing balance support: Bilateral upper extremity supported;During functional activity Standing balance-Leahy Scale: Poor                             ADL either performed or assessed with clinical judgement   ADL Overall ADL's : Needs assistance/impaired Eating/Feeding: Independent;Sitting   Grooming: Wash/dry face;Wash/dry hands;Minimal assistance;Standing;Min guard Grooming Details (indicate cue  type and reason): with RW     Lower Body Bathing: Sit to/from stand;Minimal assistance Lower Body Bathing Details (indicate cue type and reason): simulated     Lower Body Dressing: Sit to/from stand;Moderate assistance   Toilet Transfer: Ambulation;RW;BSC;Min guard   Toileting- Water quality scientist and Hygiene: Sit to/from stand;Min guard       Functional mobility during ADLs: Rolling walker;Min guard;Cueing for safety       Vision Baseline Vision/History: Wears glasses;Macular Degeneration Wears Glasses: At all times Patient Visual Report: No change from baseline     Perception     Praxis      Cognition Arousal/Alertness: Awake/alert Behavior During Therapy: WFL for tasks assessed/performed Overall Cognitive Status: Within Functional Limits for tasks assessed                                          Exercises     Shoulder Instructions       General Comments      Pertinent Vitals/ Pain       Pain Assessment: 0-10 Pain Score: 4  Pain Location: abdominal incision  Pain Descriptors / Indicators: Sore Pain Intervention(s): Monitored during session;Repositioned  Home Living  Prior Functioning/Environment              Frequency  Min 2X/week        Progress Toward Goals  OT Goals(current goals can now be found in the care plan section)  Progress towards OT goals: Progressing toward goals     Plan Discharge plan remains appropriate    Co-evaluation                 AM-PAC OT "6 Clicks" Daily Activity     Outcome Measure   Help from another person eating meals?: None Help from another person taking care of personal grooming?: A Little Help from another person toileting, which includes using toliet, bedpan, or urinal?: A Little Help from another person bathing (including washing, rinsing, drying)?: A Little Help from another person to put on and taking off  regular upper body clothing?: A Little Help from another person to put on and taking off regular lower body clothing?: A Lot 6 Click Score: 18    End of Session Equipment Utilized During Treatment: Gait belt;Rolling walker;Other (comment)(BSC)  OT Visit Diagnosis: Unsteadiness on feet (R26.81);Other abnormalities of gait and mobility (R26.89);Pain Pain - part of body: (abdomen)   Activity Tolerance Patient tolerated treatment well   Patient Left in chair;with call bell/phone within reach;with chair alarm set;with nursing/sitter in room   Nurse Communication          Time: 1282-0813 OT Time Calculation (min): 24 min  Charges: OT General Charges $OT Visit: 1 Visit OT Treatments $Self Care/Home Management : 8-22 mins $Therapeutic Activity: 8-22 mins     Britt Bottom 07/14/2018, 11:28 AM

## 2018-07-15 LAB — GLUCOSE, CAPILLARY
GLUCOSE-CAPILLARY: 139 mg/dL — AB (ref 70–99)
GLUCOSE-CAPILLARY: 179 mg/dL — AB (ref 70–99)
Glucose-Capillary: 130 mg/dL — ABNORMAL HIGH (ref 70–99)
Glucose-Capillary: 136 mg/dL — ABNORMAL HIGH (ref 70–99)
Glucose-Capillary: 176 mg/dL — ABNORMAL HIGH (ref 70–99)
Glucose-Capillary: 182 mg/dL — ABNORMAL HIGH (ref 70–99)

## 2018-07-15 LAB — TYPE AND SCREEN
ABO/RH(D): O POS
Antibody Screen: NEGATIVE
Unit division: 0

## 2018-07-15 LAB — BASIC METABOLIC PANEL
Anion gap: 8 (ref 5–15)
Anion gap: 9 (ref 5–15)
Anion gap: 11 (ref 5–15)
BUN: 33 mg/dL — ABNORMAL HIGH (ref 8–23)
BUN: 33 mg/dL — ABNORMAL HIGH (ref 8–23)
BUN: 31 mg/dL — ABNORMAL HIGH (ref 8–23)
CHLORIDE: 100 mmol/L (ref 98–111)
CO2: 24 mmol/L (ref 22–32)
CO2: 23 mmol/L (ref 22–32)
CO2: 21 mmol/L — ABNORMAL LOW (ref 22–32)
Calcium: 8 mg/dL — ABNORMAL LOW (ref 8.9–10.3)
Calcium: 7.8 mg/dL — ABNORMAL LOW (ref 8.9–10.3)
Calcium: 8 mg/dL — ABNORMAL LOW (ref 8.9–10.3)
Chloride: 100 mmol/L (ref 98–111)
Chloride: 99 mmol/L (ref 98–111)
Creatinine, Ser: 2.83 mg/dL — ABNORMAL HIGH (ref 0.61–1.24)
Creatinine, Ser: 2.5 mg/dL — ABNORMAL HIGH (ref 0.61–1.24)
Creatinine, Ser: 2.32 mg/dL — ABNORMAL HIGH (ref 0.61–1.24)
GFR calc Af Amer: 24 mL/min — ABNORMAL LOW (ref >60)
GFR calc Af Amer: 30 mL/min — ABNORMAL LOW (ref >60)
GFR calc non Af Amer: 21 mL/min — ABNORMAL LOW (ref >60)
GFR calc non Af Amer: 24 mL/min — ABNORMAL LOW (ref >60)
GFR calc non Af Amer: 26 mL/min — ABNORMAL LOW (ref >60)
GFR, EST AFRICAN AMERICAN: 28 mL/min — AB (ref >60)
Glucose, Bld: 145 mg/dL — ABNORMAL HIGH (ref 70–99)
Glucose, Bld: 167 mg/dL — ABNORMAL HIGH (ref 70–99)
Glucose, Bld: 225 mg/dL — ABNORMAL HIGH (ref 70–99)
Potassium: 5.2 mmol/L — ABNORMAL HIGH (ref 3.5–5.1)
Potassium: 4.5 mmol/L (ref 3.5–5.1)
Potassium: 4.9 mmol/L (ref 3.5–5.1)
SODIUM: 132 mmol/L — AB (ref 135–145)
SODIUM: 131 mmol/L — AB (ref 135–145)
Sodium: 132 mmol/L — ABNORMAL LOW (ref 135–145)

## 2018-07-15 LAB — CBC
HCT: 26.4 % — ABNORMAL LOW (ref 39.0–52.0)
HEMOGLOBIN: 8.4 g/dL — AB (ref 13.0–17.0)
MCH: 28.2 pg (ref 26.0–34.0)
MCHC: 31.8 g/dL (ref 30.0–36.0)
MCV: 88.6 fL (ref 80.0–100.0)
Platelets: 380 10*3/uL (ref 150–400)
RBC: 2.98 MIL/uL — ABNORMAL LOW (ref 4.22–5.81)
RDW: 15.1 % (ref 11.5–15.5)
WBC: 12.4 10*3/uL — ABNORMAL HIGH (ref 4.0–10.5)
nRBC: 0 % (ref 0.0–0.2)

## 2018-07-15 LAB — BPAM RBC
BLOOD PRODUCT EXPIRATION DATE: 202002182359
ISSUE DATE / TIME: 202001161108
UNIT TYPE AND RH: 5100

## 2018-07-15 MED ORDER — DEXTROSE-NACL 5-0.45 % IV SOLN
INTRAVENOUS | Status: AC
  Administered 2018-07-15: 15:00:00 via INTRAVENOUS

## 2018-07-15 NOTE — Progress Notes (Signed)
Pt ambulated approximately 175' with front wheel walker on room air. He tolerated well, but tired quickly.  Unsuccessful BM attempt on BSC prior to sitting up in recliner.  Family at bedside, call bell within reach.

## 2018-07-15 NOTE — Progress Notes (Signed)
Pt ambulated about 150' with rolling walker and on room air.  He has been complaining of RUQ abdominal pain with nausea.  He has been passing gas today, but bowel sounds are sluggish.  Also of note, EKG has been NST in 100-110s today with frequent PVCs, up to 130s with ambulation.  Diastolic pressures have been soft.  Vascular team called and notified of pt's current status.  PA assessed pt at bedside, no new orders at this time.

## 2018-07-15 NOTE — Progress Notes (Signed)
Physical Therapy Treatment Patient Details Name: Christian Bailey MRN: [id number] DOB: Dec 24, 1940 Today's Date: 07/15/2018    History of Present Illness Pt s/p aortobifemoral bypass and redo of rt fem pop bypass on 07/06/18. PMH - rt reverse total shoulder, HTN, DM, OA, PAD, lower extremity bypass    PT Comments    Treatment limited secondary to nausea (RN aware). Pt requesting to sit edge of bed to eat dinner, requiring min assist for bed mobility. Deferring further mobility/exercises, but previously ambulated 175 feet with walker with RN. Educated on activity progression recommendations. Will continue to progress as tolerated.    Follow Up Recommendations  Home health PT;Supervision for mobility/OOB     Equipment Recommendations  Other (comment)(rollator)    Recommendations for Other Services       Precautions / Restrictions Precautions Precautions: Fall Precaution Comments: wound vac Restrictions Weight Bearing Restrictions: No    Mobility  Bed Mobility Overal bed mobility: Needs Assistance Bed Mobility: Rolling;Sidelying to Sit Rolling: Modified independent (Device/Increase time) Sidelying to sit: Min assist       General bed mobility comments: Pt seeking handheld assist to elevate trunk  Transfers                    Ambulation/Gait                 Stairs             Wheelchair Mobility    Modified Rankin (Stroke Patients Only)       Balance Overall balance assessment: Needs assistance   Sitting balance-Leahy Scale: Good                                      Cognition Arousal/Alertness: Awake/alert Behavior During Therapy: WFL for tasks assessed/performed Overall Cognitive Status: Within Functional Limits for tasks assessed                                        Exercises      General Comments        Pertinent Vitals/Pain Pain Assessment: Faces Faces Pain Scale: Hurts even more Pain  Location: abdominal incision with transitional movments Pain Descriptors / Indicators: Grimacing;Other (Comment)(holding breath) Pain Intervention(s): Monitored during session    Home Living                      Prior Function            PT Goals (current goals can now be found in the care plan section) Acute Rehab PT Goals Potential to Achieve Goals: Good    Frequency    Min 3X/week      PT Plan Current plan remains appropriate    Co-evaluation              AM-PAC PT "6 Clicks" Mobility   Outcome Measure  Help needed turning from your back to your side while in a flat bed without using bedrails?: None Help needed moving from lying on your back to sitting on the side of a flat bed without using bedrails?: A Little Help needed moving to and from a bed to a chair (including a wheelchair)?: A Little Help needed standing up from a chair using your arms (e.g., wheelchair or bedside chair)?: A Little Help needed to walk  in hospital room?: A Little Help needed climbing 3-5 steps with a railing? : A Lot 6 Click Score: 18    End of Session   Activity Tolerance: Patient tolerated treatment well Patient left: Other (comment);with call bell/phone within reach(sitting EOB)   PT Visit Diagnosis: Other abnormalities of gait and mobility (R26.89)     Time: 8022-3361 PT Time Calculation (min) (ACUTE ONLY): 8 min  Charges:  $Therapeutic Activity: 8-22 mins                    Ellamae Sia, PT, DPT Acute Rehabilitation Services Pager [phone] Office [phone]   Willy Eddy 07/15/2018, 4:38 PM

## 2018-07-16 ENCOUNTER — Inpatient Hospital Stay: Payer: Self-pay

## 2018-07-16 ENCOUNTER — Inpatient Hospital Stay (HOSPITAL_COMMUNITY): Payer: Medicare Other

## 2018-07-16 LAB — CBC
HCT: 27.3 % — ABNORMAL LOW (ref 39.0–52.0)
Hemoglobin: 8.7 g/dL — ABNORMAL LOW (ref 13.0–17.0)
MCH: 28.2 pg (ref 26.0–34.0)
MCHC: 31.9 g/dL (ref 30.0–36.0)
MCV: 88.6 fL (ref 80.0–100.0)
Platelets: 464 10*3/uL — ABNORMAL HIGH (ref 150–400)
RBC: 3.08 MIL/uL — ABNORMAL LOW (ref 4.22–5.81)
RDW: 14.6 % (ref 11.5–15.5)
WBC: 19.3 10*3/uL — ABNORMAL HIGH (ref 4.0–10.5)
nRBC: 0 % (ref 0.0–0.2)

## 2018-07-16 LAB — URINALYSIS, ROUTINE W REFLEX MICROSCOPIC
Bacteria, UA: NONE SEEN
Bilirubin Urine: NEGATIVE
Glucose, UA: 50 mg/dL — AB
Ketones, ur: 5 mg/dL — AB
Leukocytes, UA: NEGATIVE
NITRITE: NEGATIVE
PH: 5 (ref 5.0–8.0)
Protein, ur: NEGATIVE mg/dL
Specific Gravity, Urine: 1.01 (ref 1.005–1.030)

## 2018-07-16 LAB — BASIC METABOLIC PANEL
Anion gap: 10 (ref 5–15)
BUN: 22 mg/dL (ref 8–23)
CO2: 22 mmol/L (ref 22–32)
Calcium: 8 mg/dL — ABNORMAL LOW (ref 8.9–10.3)
Chloride: 99 mmol/L (ref 98–111)
Creatinine, Ser: 1.76 mg/dL — ABNORMAL HIGH (ref 0.61–1.24)
GFR calc Af Amer: 42 mL/min — ABNORMAL LOW (ref >60)
GFR calc non Af Amer: 36 mL/min — ABNORMAL LOW (ref >60)
Glucose, Bld: 332 mg/dL — ABNORMAL HIGH (ref 70–99)
Potassium: 4.7 mmol/L (ref 3.5–5.1)
Sodium: 131 mmol/L — ABNORMAL LOW (ref 135–145)

## 2018-07-16 LAB — PHOSPHORUS: Phosphorus: 3.3 mg/dL (ref 2.5–4.6)

## 2018-07-16 LAB — GLUCOSE, CAPILLARY
GLUCOSE-CAPILLARY: 199 mg/dL — AB (ref 70–99)
Glucose-Capillary: 156 mg/dL — ABNORMAL HIGH (ref 70–99)
Glucose-Capillary: 199 mg/dL — ABNORMAL HIGH (ref 70–99)
Glucose-Capillary: 160 mg/dL — ABNORMAL HIGH (ref 70–99)
Glucose-Capillary: 183 mg/dL — ABNORMAL HIGH (ref 70–99)

## 2018-07-16 LAB — MAGNESIUM: Magnesium: 1.4 mg/dL — ABNORMAL LOW (ref 1.7–2.4)

## 2018-07-16 MED ORDER — INSULIN ASPART 100 UNIT/ML ~~LOC~~ SOLN
0.0000 [IU] | SUBCUTANEOUS | Status: DC
  Administered 2018-07-16 (×3): 3 [IU] via SUBCUTANEOUS
  Administered 2018-07-17: 2 [IU] via SUBCUTANEOUS
  Administered 2018-07-17: 3 [IU] via SUBCUTANEOUS
  Administered 2018-07-17 (×2): 5 [IU] via SUBCUTANEOUS
  Administered 2018-07-17: 3 [IU] via SUBCUTANEOUS
  Administered 2018-07-17: 5 [IU] via SUBCUTANEOUS
  Administered 2018-07-18: 2 [IU] via SUBCUTANEOUS
  Administered 2018-07-18: 3 [IU] via SUBCUTANEOUS
  Administered 2018-07-18 (×2): 5 [IU] via SUBCUTANEOUS
  Administered 2018-07-18: 3 [IU] via SUBCUTANEOUS
  Administered 2018-07-18 – 2018-07-19 (×3): 5 [IU] via SUBCUTANEOUS
  Administered 2018-07-19: 3 [IU] via SUBCUTANEOUS
  Administered 2018-07-19 – 2018-07-20 (×4): 5 [IU] via SUBCUTANEOUS
  Administered 2018-07-20: 3 [IU] via SUBCUTANEOUS
  Administered 2018-07-20: 5 [IU] via SUBCUTANEOUS
  Administered 2018-07-20 (×3): 3 [IU] via SUBCUTANEOUS
  Administered 2018-07-20 – 2018-07-21 (×5): 5 [IU] via SUBCUTANEOUS
  Administered 2018-07-22: 3 [IU] via SUBCUTANEOUS
  Administered 2018-07-22 (×5): 5 [IU] via SUBCUTANEOUS
  Administered 2018-07-23 (×5): 3 [IU] via SUBCUTANEOUS
  Administered 2018-07-24 (×2): 2 [IU] via SUBCUTANEOUS
  Administered 2018-07-24: 3 [IU] via SUBCUTANEOUS
  Administered 2018-07-24 (×2): 2 [IU] via SUBCUTANEOUS

## 2018-07-16 MED ORDER — SODIUM CHLORIDE 0.9% FLUSH
10.0000 mL | Freq: Two times a day (BID) | INTRAVENOUS | Status: DC
  Administered 2018-07-19 (×2): 10 mL
  Administered 2018-07-19 – 2018-07-20 (×2): 20 mL
  Administered 2018-07-20 – 2018-07-25 (×9): 10 mL
  Administered 2018-07-26 – 2018-07-27 (×3): 20 mL
  Administered 2018-07-28 – 2018-07-29 (×5): 10 mL

## 2018-07-16 MED ORDER — SODIUM CHLORIDE 0.9 % IV SOLN
INTRAVENOUS | Status: AC
  Administered 2018-07-16: 18:00:00 via INTRAVENOUS
  Administered 2018-07-17: 35 mL/h via INTRAVENOUS

## 2018-07-16 MED ORDER — TRAVASOL 10 % IV SOLN
INTRAVENOUS | Status: AC
  Administered 2018-07-16: 18:00:00 via INTRAVENOUS
  Filled 2018-07-16: qty 576

## 2018-07-16 MED ORDER — MAGNESIUM SULFATE IN D5W 1-5 GM/100ML-% IV SOLN
1.0000 g | Freq: Once | INTRAVENOUS | Status: AC
  Administered 2018-07-16: 1 g via INTRAVENOUS
  Filled 2018-07-16: qty 100

## 2018-07-16 MED ORDER — SODIUM CHLORIDE 0.9% FLUSH
10.0000 mL | INTRAVENOUS | Status: DC | PRN
  Administered 2018-07-18 (×2): 10 mL
  Filled 2018-07-16 (×2): qty 40

## 2018-07-16 MED ORDER — LIOTHYRONINE SODIUM 10 MCG/ML IV SOLN: 20.0000 ug | Freq: Once | INTRAVENOUS | Status: DC

## 2018-07-16 NOTE — Progress Notes (Signed)
Initial Nutrition Assessment  DOCUMENTATION CODES:  Not applicable  INTERVENTION:  TPN per pharmacy  Will monitor for diet re initiation or any changes that would greatly impact estimated needs  NUTRITION DIAGNOSIS:  Inadequate oral intake related to altered GI function(Post op ileus) as evidenced by no meaningful oral intake x10 days.  GOAL:  Patient will meet greater than or equal to 90% of their needs  MONITOR:  Supplement acceptance, PO intake, Diet advancement, Labs, I & O's, Skin  REASON FOR ASSESSMENT:  Consult New TPN/TNA  ASSESSMENT:  78 y/o male PMHx DM2, HTN/HLD, CAD, PVD. Initially presented to hospital for revision of femoral-popliteal bypass graft, done 1/8. Post-op patient has been slow to progress. RD consulted for TPN initiation d/t prolonged diet intolerance/ileus.   On RD arrival, pt is intermittently moaning and  somewhat confused. He even outright tells RD "I dont know much, Im confused". He asks several nonsensical questions, such as "are you projecting the temperature on to me? Because I can see it above me". May be related to hyponatremia or medications  Subjectively he does say he had worsening of his distension and abdominal pain over the past couple days. He thought he had a BM yesterday, though this is not documented. He does report a UBW of 150 lbs.   Vast majority of information taken from chart-Following pt surgery 1/8, pt unable to pass flatus until POD#3 and afterward he still had significant nausea and distension. As such, his diet was unable to be advanced to CL until 1/14. While on clears 1/14-1/15, appears he had only to have been able to eat 25-50% of meals. He seemed to be tolerating his diet and he was advanced to HH/carb mod diet early 1/16. However, after eating 100% of his first meal on this diet (Breakfast 1/16), he quickly developed nausea/pain and has not eaten anything since that time. He was eventually made NPO again on 1/18, though it  doesn't appear he had ever really eaten since that first meal on 1/16.  Per chart, pts wt had been quite stable the 6 months PTA, ranging 148-152 lbs. He is currently 154.75 lbs, though he has an extremely distended abdomen.  Though he had not had any meaningful oral intake x10 days, he has been receiving Dextrose containing IVF for more than 1 week. Should not be high risk for refeeding.   Labs: WBC jump from 12.4 yesterday to 19.3 today. Na : 131, Bgs: recently avg 170-220, Phos/mag wdl,  Meds: Colace, PPI, Ultram, Zofran  Recent Labs  Lab 07/10/18 0730  07/15/18 0929 07/15/18 1642 07/16/18 0353  NA 139   < > 132* 131* 131*  K 4.4   < > 4.5 4.9 4.7  CL 108   < > 100 99 99  CO2 25   < > 23 21* 22  BUN 12   < > 33* 31* 22  CREATININE 1.28*   < > 2.50* 2.32* 1.76*  CALCIUM 8.5*   < > 7.8* 8.0* 8.0*  MG 1.8  --   --   --  1.4*  PHOS 1.9*  --   --   --  3.3  GLUCOSE 133*   < > 167* 225* 332*   < > = values in this interval not displayed.   NUTRITION - FOCUSED PHYSICAL EXAM:   Most Recent Value  Orbital Region  No depletion  Upper Arm Region  No depletion  Thoracic and Lumbar Region  No depletion  Buccal Region  No  depletion  Temple Region  No depletion  Clavicle Bone Region  No depletion  Clavicle and Acromion Bone Region  No depletion  Scapular Bone Region  No depletion  Dorsal Hand  No depletion  Patellar Region  No depletion  Anterior Thigh Region  No depletion  Posterior Calf Region  No depletion  Edema (RD Assessment)  None  Hair  Reviewed  Eyes  Reviewed  Mouth  Reviewed  Skin  Reviewed  Nails  Reviewed       Diet Order:   Diet Order            Diet NPO time specified Except for: Sips with Meds  Diet effective now             EDUCATION NEEDS:  No education needs have been identified at this time  Skin:  Surgical Incision to abdomen Surgical Incisions to bilateral groin (NPWT)  Last BM:  1/15  Height:  Ht Readings from Last 1 Encounters:   07/06/18 5\' 5"  (1.651 m)   Weight:  Wt Readings from Last 1 Encounters:  07/16/18 70.2 kg   Wt Readings from Last 10 Encounters:  07/16/18 70.2 kg  07/01/18 68.4 kg  06/16/18 67.1 kg  06/07/18 67.4 kg  03/14/18 67.8 kg  01/03/18 67.4 kg  10/04/17 70.6 kg  09/14/17 71.7 kg  09/09/17 71.7 kg  09/06/17 71.7 kg   Ideal Body Weight:  61.82 kg  BMI:  Body mass index is 25.75 kg/m.  Estimated Nutritional Needs:  Kcal:  2000-2200 (30 kcal/kg +/- 100) Protein:  105-125g Pro (1.5-1.8g/kg bw) Fluid:  2-2.2 L (28ml/kcal)  Burtis Junes RD, LDN, CNSC Clinical Nutrition Available Tues-Sat via Pager: [phone] 07/16/2018 3:53 PM

## 2018-07-16 NOTE — Progress Notes (Signed)
[place] CONSULT NOTE   Pharmacy Consult for TPN Indication: Prolonged ileus  Patient Measurements: Height: 5\' 5"  (165.1 cm) Weight: 154 lb 12.2 oz (70.2 kg) IBW/kg (Calculated) : 61.5 TPN AdjBW (KG): 68.9 Body mass index is 25.75 kg/m. Usual Weight: 68.9 kg  Assessment: 78 year old male admitted with recurrent claudication s/p aortobifemoral bypass graft and revised right femoral-popliteal bypass graft on 07/06/18. Patient remained NPO until 1/14.  Clears were initiated 1/14 and diet was advanced on 1/16 but patient has had poor intake (0-25% recorded). On 1/17, patient began having RUQ abdominal pain with nausea and sluggish bowel sounds. Symptoms continued to worsen POD#10 (1/18) including increased nausea with sensation to vomit and increased abdominal distension. Patient was made NPO and pharmacy was consulted to initiate TPN for prolonged ileus.   GI: Prolonged ileus with ~10 days of NPO/poor intake. KUB pending. LBM 1/15. PPI IV daily. Colace daily.  Endo: Hx DM. CBGs 156-199 on SSI (pta Metformin held).  Insulin requirements in the past 24 hours: 8 units Lytes: Na low 131, CoCa 8.7. K was high on 1/17 but now down to wnl after K+ was removed from IV fluids. Last Phos is wnl at 3.3. Mg is low at 1.4. Cl and Acetate are on the low end of normal.  Renal: AKI, SCr improving at 1.76 (pk 2.83; b/l 1.3-1.5), BUN 22. CrCl ~30 mL/min. Good UOP of 1 mL/kg/hr. I/O: +2.9L.  IVF are D5-1/2NS at 75 ml/hr.   Pulm: RA Cards: Chronic RBBB, CAD, PAD, HTN, HLD. BP elevated, ST/PVCs.  Hepatobil: Last LFTs on 1/9 were wnl. Tbili 0.8.  Neuro: Tramadol for pain ID: WBC increased at 19.3, Afebriile.   TPN Access: PICC 1/18 (confirmed with IV team to be placed after KUB today) TPN start date: 1/18 Nutritional Goals (per RD recommendation on pending): kCal: Protein:  Fluid:  Current Nutrition:  NPO   Plan:  Intiate TPN at 43mL/hr. This TPN provides 58 g of  protein, 134 g of dextrose, and 29 g of lipids which provides 975 kCals per day, meeting 50% of patient needs Electrolytes in TPN: Standard except decr K Add MVI and trace elements to TPN Increase to moderate SSI and adjust as needed Change IVMF to NS at 35 ml/hr at 1800 when TPN is initiated. Monitor TPN labs, nutrition plan F/U RD recommendations  Magnesium 1g IV x1  Sloan Leiter, PharmD, BCPS, BCCCP Clinical Pharmacist Clinical phone 07/16/2018 until 3:30PM - #[phone] Please refer to [facility] for [place] numbers 07/16/2018,9:44 AM

## 2018-07-16 NOTE — Progress Notes (Signed)
Peripherally Inserted Central Catheter/Midline Placement  The IV Nurse has discussed with the patient and/or persons authorized to consent for the patient, the purpose of this procedure and the potential benefits and risks involved with this procedure.  The benefits include less needle sticks, lab draws from the catheter, and the patient may be discharged home with the catheter. Risks include, but not limited to, infection, bleeding, blood clot (thrombus formation), and puncture of an artery; nerve damage and irregular heartbeat and possibility to perform a PICC exchange if needed/ordered by physician.  Alternatives to this procedure were also discussed.  Bard Power PICC patient education guide, fact sheet on infection prevention and patient information card has been provided to patient /or left at bedside.    PICC/Midline Placement Documentation  PICC Double Lumen 07/16/18 PICC Left Brachial 40 cm 2 cm (Active)  Indication for Insertion or Continuance of Line Administration of hyperosmolar/irritating solutions (i.e. TPN, Vancomycin, etc.) 07/16/2018  4:03 PM  Exposed Catheter (cm) 2 cm 07/16/2018  4:03 PM  Site Assessment Clean;Dry;Intact 07/16/2018  4:03 PM  Lumen #1 Status Flushed;Saline locked;Blood return noted 07/16/2018  4:03 PM  Lumen #2 Status Flushed;Saline locked;Blood return noted 07/16/2018  4:03 PM  Dressing Type Transparent 07/16/2018  4:03 PM  Dressing Status Clean;Dry;Intact;Antimicrobial disc in place 07/16/2018  4:03 PM  Line Care Connections checked and tightened 07/16/2018  4:03 PM  Line Adjustment (NICU/IV Team Only) No 07/16/2018  4:03 PM  Dressing Intervention New dressing 07/16/2018  4:03 PM  Dressing Change Due 07/23/18 07/16/2018  4:03 PM       Rolena Infante 07/16/2018, 4:04 PM

## 2018-07-16 NOTE — Progress Notes (Signed)
Spoke with Sharyn Lull RN re PICC.  Pt is currently off the floor for [place].  Will check back later.

## 2018-07-16 NOTE — Progress Notes (Signed)
Spoke with RN re PICC order.  States pt able to sign consent, but is transferring down now for stat CT scan.  RN to place IV Team consult to notify when pt returns.

## 2018-07-16 NOTE — Progress Notes (Addendum)
Progress Note    07/16/2018 8:39 AM 10 Days Post-Op  Subjective:  Says he feels bad; nauseated and feels like he is always on the verge of vomiting.  Afebrile HR 110's-120's ST 540'J-811'B systolic 14-78% RA  Vitals:   07/15/18 2019 07/16/18 0423  BP: (!) 145/70 138/70  Pulse: (!) 106 (!) 116  Resp: 14 15  Temp: (!) 97.5 F (36.4 C) 97.8 F (36.6 C)  SpO2: 94% 100%    Physical Exam: Cardiac:  tachy Lungs:  Non labored Incisions:  Midline incision is clean and healing nicely Extremities:  +palpable right DP pulse; unable to palpate left foot pulses Abdomen:  Appears more distended today than yesterday afternoon; decreased BS; +flatus  CBC    Component Value Date/Time   WBC 19.3 (H) 07/16/2018 0353   RBC 3.08 (L) 07/16/2018 0353   HGB 8.7 (L) 07/16/2018 0353   HCT 27.3 (L) 07/16/2018 0353   PLT 464 (H) 07/16/2018 0353   MCV 88.6 07/16/2018 0353   MCH 28.2 07/16/2018 0353   MCHC 31.9 07/16/2018 0353   RDW 14.6 07/16/2018 0353   LYMPHSABS 1,722 04/10/2016 0851   MONOABS 656 04/10/2016 0851   EOSABS 246 04/10/2016 0851   BASOSABS 82 04/10/2016 0851    BMET    Component Value Date/Time   NA 131 (L) 07/16/2018 0353   K 4.7 07/16/2018 0353   CL 99 07/16/2018 0353   CO2 22 07/16/2018 0353   GLUCOSE 332 (H) 07/16/2018 0353   BUN 22 07/16/2018 0353   CREATININE 1.76 (H) 07/16/2018 0353   CREATININE 1.33 (H) 04/10/2016 0851   CALCIUM 8.0 (L) 07/16/2018 0353   GFRNONAA 36 (L) 07/16/2018 0353   GFRAA 42 (L) 07/16/2018 0353    INR    Component Value Date/Time   INR 1.25 07/06/2018 1600     Intake/Output Summary (Last 24 hours) at 07/16/2018 0839 Last data filed at 07/16/2018 0548 Gross per 24 hour  Intake -  Output 1700 ml  Net -1700 ml     Assessment:  78 y.o. male is s/p:  aortobifemoral bypass grafting  10 Days Post-Op  Plan: -pt's abdomen appears more distended this morning from yesterday afternoon and continues to be nauseated.  Last BM a  couple of days ago despite dulcolax supp yesterday.  Leukocytosis worsening at 19k this am.  May need to remove Pravena vacs to check incisions in groins.    Will order KUB; may need NGT; npo for now; will order u/a -creatinine improved this morning; hgb slightly improved this morning. -may need nutrition consult for parenteral nutrition -DVT prophylaxis:  Sq heparin   [name], PA-C [facility] [phone] 07/16/2018 8:39 AM  I agree with the above.  I have seen and evaluated the patient. Acute renal insufficiency: His creatinine is improving today.  We will continue with IV hydration and repeat labs in the morning. GI: He is more distended today and is nauseated.  KUB demonstrated ileus.  I sent him for CT scan without contrast.  This is consistent with ileus and possible gallbladder issues.  A right upper quadrant ultrasound will be obtained. Acute blood loss anemia: CT scan shows a stable left lower abdomen hematoma.  Hemoglobin remained stable after 1 unit of blood transfusion.  I do not think he is actively bleeding.  He will be n.p.o. given his ileus and nausea.  He may require NG tube placement.  CT scan was concerning for gallbladder issues and so a right upper  quadrant ultrasound has been ordered.  I discussed this with the patient over the telephone. ID: White blood cell count is elevated today.  This could potentially be related to cholecystitis.  A UA has been sent.  I spoke with the wife in person and updated her on his clinical condition.  Christian Bailey

## 2018-07-17 ENCOUNTER — Inpatient Hospital Stay (HOSPITAL_COMMUNITY): Payer: Medicare Other

## 2018-07-17 LAB — DIFFERENTIAL
Abs Immature Granulocytes: 0 10*3/uL (ref 0.00–0.07)
BASOS ABS: 0 10*3/uL (ref 0.0–0.1)
Basophils Relative: 0 %
EOS PCT: 0 %
Eosinophils Absolute: 0 10*3/uL (ref 0.0–0.5)
Lymphocytes Relative: 1 %
Lymphs Abs: 0.4 10*3/uL — ABNORMAL LOW (ref 0.7–4.0)
Monocytes Absolute: 1.6 10*3/uL — ABNORMAL HIGH (ref 0.1–1.0)
Monocytes Relative: 4 %
Neutro Abs: 39 10*3/uL — ABNORMAL HIGH (ref 1.7–7.7)
Neutrophils Relative %: 95 %
nRBC: 0 /100 WBC

## 2018-07-17 LAB — CBC
HCT: 26.8 % — ABNORMAL LOW (ref 39.0–52.0)
HCT: 27.3 % — ABNORMAL LOW (ref 39.0–52.0)
HCT: 24.7 % — ABNORMAL LOW (ref 39.0–52.0)
HEMOGLOBIN: 8.7 g/dL — AB (ref 13.0–17.0)
Hemoglobin: 8.5 g/dL — ABNORMAL LOW (ref 13.0–17.0)
Hemoglobin: 8.1 g/dL — ABNORMAL LOW (ref 13.0–17.0)
MCH: 30.6 pg (ref 26.0–34.0)
MCH: 29 pg (ref 26.0–34.0)
MCH: 30.2 pg (ref 26.0–34.0)
MCHC: 32.5 g/dL (ref 30.0–36.0)
MCHC: 31.1 g/dL (ref 30.0–36.0)
MCHC: 32.8 g/dL (ref 30.0–36.0)
MCV: 94.4 fL (ref 80.0–100.0)
MCV: 93.2 fL (ref 80.0–100.0)
MCV: 92.2 fL (ref 80.0–100.0)
NRBC: 0 % (ref 0.0–0.2)
Platelets: 519 10*3/uL — ABNORMAL HIGH (ref 150–400)
Platelets: 491 10*3/uL — ABNORMAL HIGH (ref 150–400)
Platelets: 483 10*3/uL — ABNORMAL HIGH (ref 150–400)
RBC: 2.84 MIL/uL — ABNORMAL LOW (ref 4.22–5.81)
RBC: 2.93 MIL/uL — ABNORMAL LOW (ref 4.22–5.81)
RBC: 2.68 MIL/uL — ABNORMAL LOW (ref 4.22–5.81)
RDW: 15.1 % (ref 11.5–15.5)
RDW: 14.6 % (ref 11.5–15.5)
RDW: 14.8 % (ref 11.5–15.5)
WBC: 41.1 10*3/uL — ABNORMAL HIGH (ref 4.0–10.5)
WBC: 41.1 10*3/uL — AB (ref 4.0–10.5)
WBC: 34.7 10*3/uL — ABNORMAL HIGH (ref 4.0–10.5)
nRBC: 0 % (ref 0.0–0.2)
nRBC: 0 % (ref 0.0–0.2)

## 2018-07-17 LAB — COMPREHENSIVE METABOLIC PANEL
ALK PHOS: 79 U/L (ref 38–126)
ALT: 11 U/L (ref 0–44)
AST: 19 U/L (ref 15–41)
Albumin: 2.1 g/dL — ABNORMAL LOW (ref 3.5–5.0)
Anion gap: 10 (ref 5–15)
BUN: 17 mg/dL (ref 8–23)
CO2: 24 mmol/L (ref 22–32)
Calcium: 8 mg/dL — ABNORMAL LOW (ref 8.9–10.3)
Chloride: 92 mmol/L — ABNORMAL LOW (ref 98–111)
Creatinine, Ser: 1.38 mg/dL — ABNORMAL HIGH (ref 0.61–1.24)
GFR calc Af Amer: 57 mL/min — ABNORMAL LOW (ref >60)
GFR calc non Af Amer: 49 mL/min — ABNORMAL LOW (ref >60)
Glucose, Bld: 468 mg/dL — ABNORMAL HIGH (ref 70–99)
Potassium: 4.7 mmol/L (ref 3.5–5.1)
SODIUM: 126 mmol/L — AB (ref 135–145)
TOTAL PROTEIN: 5.2 g/dL — AB (ref 6.5–8.1)
Total Bilirubin: 0.7 mg/dL (ref 0.3–1.2)

## 2018-07-17 LAB — GLUCOSE, CAPILLARY
Glucose-Capillary: 139 mg/dL — ABNORMAL HIGH (ref 70–99)
Glucose-Capillary: 155 mg/dL — ABNORMAL HIGH (ref 70–99)
Glucose-Capillary: 175 mg/dL — ABNORMAL HIGH (ref 70–99)
Glucose-Capillary: 192 mg/dL — ABNORMAL HIGH (ref 70–99)
Glucose-Capillary: 205 mg/dL — ABNORMAL HIGH (ref 70–99)
Glucose-Capillary: 212 mg/dL — ABNORMAL HIGH (ref 70–99)
Glucose-Capillary: 221 mg/dL — ABNORMAL HIGH (ref 70–99)

## 2018-07-17 LAB — PREALBUMIN: Prealbumin: <5 mg/dL — ABNORMAL LOW (ref 18–38)

## 2018-07-17 LAB — MAGNESIUM: Magnesium: 1.8 mg/dL (ref 1.7–2.4)

## 2018-07-17 LAB — PHOSPHORUS: Phosphorus: 4.5 mg/dL (ref 2.5–4.6)

## 2018-07-17 LAB — TRIGLYCERIDES: TRIGLYCERIDES: 179 mg/dL — AB (ref <150)

## 2018-07-17 MED ORDER — TRAVASOL 10 % IV SOLN
INTRAVENOUS | Status: AC
  Administered 2018-07-17: 18:00:00 via INTRAVENOUS
  Filled 2018-07-17: qty 967.2

## 2018-07-17 MED ORDER — TRAVASOL 10 % IV SOLN: INTRAVENOUS | Status: DC

## 2018-07-17 MED ORDER — SODIUM CHLORIDE 0.9 % IV SOLN
INTRAVENOUS | Status: DC
  Administered 2018-07-17 – 2018-07-20 (×3): via INTRAVENOUS

## 2018-07-17 MED ORDER — PIPERACILLIN-TAZOBACTAM 3.375 G IVPB
3.3750 g | Freq: Three times a day (TID) | INTRAVENOUS | Status: DC
  Administered 2018-07-17 – 2018-07-20 (×9): 3.375 g via INTRAVENOUS
  Filled 2018-07-17 (×10): qty 50

## 2018-07-17 MED ORDER — METOPROLOL TARTRATE 5 MG/5ML IV SOLN
2.5000 mg | Freq: Four times a day (QID) | INTRAVENOUS | Status: DC
  Administered 2018-07-17 – 2018-07-25 (×33): 2.5 mg via INTRAVENOUS
  Filled 2018-07-17 (×31): qty 5

## 2018-07-17 MED ORDER — METRONIDAZOLE IN NACL 5-0.79 MG/ML-% IV SOLN: 500.0000 mg | Freq: Four times a day (QID) | INTRAVENOUS | Status: DC

## 2018-07-17 NOTE — Progress Notes (Signed)
[place] CONSULT NOTE   Pharmacy Consult for TPN Indication: Prolonged ileus  Patient Measurements: Height: 5\' 5"  (165.1 cm) Weight: 154 lb 12.2 oz (70.2 kg) IBW/kg (Calculated) : 61.5 TPN AdjBW (KG): 68.9 Body mass index is 25.75 kg/m. Usual Weight: 68.9 kg  Assessment: 78 year old male admitted with recurrent claudication s/p aortobifemoral bypass graft and revised right femoral-popliteal bypass graft on 07/06/18. Patient remained NPO until 1/14.  Clears were initiated 1/14 and diet was advanced on 1/16 but patient has had poor intake (0-25% recorded). On 1/17, patient began having RUQ abdominal pain with nausea and sluggish bowel sounds. Symptoms continued to worsen POD#10 (1/18) including increased nausea with sensation to vomit and increased abdominal distension. Patient was receiving dextrose in IVF during this time. Patient was made NPO and pharmacy was consulted to initiate TPN for prolonged ileus.   GI: Prolonged ileus with ~10 days of NPO/poor intake. Pre-albumin <5. Albumin 2.1. LBM 1/15. PPI IV daily. Colace daily. Decreased tenderness on exam. RUQ [place] overall negative. Flatus noted by RN. Wound vac to be placed.  Endo: Hx DM. CBGs 150-190s on SSI (pta Metformin held). Ignoring 468 - lab contaminated with TPN.  Insulin requirements in the past 24 hours: 8 units Lytes: Na low 126, CoCa 9.5. K was high on 1/17 but now down to wnl after K+ was removed from IV fluids. Last Phos wnl. Mg improved at 1.8 after 1g IV yesterday. Cl low, Acetate improved.  Renal: AKI, SCr improved at 1.38 (pk 2.83; b/l 1.3-1.5), BUN 17. CrCl ~40 mL/min. Good UOP. I/O: +2.9L.  IVF are NS at 35 ml/hr.   Pulm: RA Cards: Chronic RBBB, CAD, PAD, HTN, HLD. BP elevated, ST/PVCs.  Hepatobil: LFTs/Tbili wnl. TG elevated at 179. Neuro: Tramadol for pain ID: WBC increased at 41!- no steroids noted. Remains afebriile. Currently no antibiotics.  TPN Access: PICC 1/18 (confirmed with  IV team to be placed after KUB today) TPN start date: 1/18 Nutritional Goals (per RD recommendation on 1/18): KCal: 2000-2200 Protein: 105-125g Fluid: 2-2.2L  Current Nutrition:  NPO -sips with meds only  Goal TPN rate is 80 ml/hr (provides 119g protein, 268g dextrose and 61.5g lipids providing 100% of needs)  Plan:  Increase TPN to 65 mL/hr - plans to increase to goal in AM, ensuring lytes ok with AKI.  This TPN provides  97g of protein, 218 g of dextrose, and 50 g of lipids which provides 1627 kCals per day, meeting 80% of patient needs Electrolytes in TPN: Standard except continue with decr K and incr Na and Mg. Maximize Cl at this time.  Add MVI and trace elements to TPN Continue moderate SSI and adjust as needed Stop IVF at 1800 with new TPN.  Monitor TPN labs, nutrition plan F/U improving bowel function and ability to start diet and wean TPN Monitor for potential infection with significant bump in WBC.  Sloan Leiter, PharmD, BCPS, BCCCP Clinical Pharmacist Clinical phone 07/17/2018 until 3:30PM [phone] Please refer to [facility] for [place] numbers 07/17/2018,7:00 AM

## 2018-07-17 NOTE — Progress Notes (Signed)
Pharmacy Antibiotic Note  Christian Bailey is a 78 y.o. male admitted on 07/06/2018 with recurrent claudication now s/p aortobifemoral bypass graft and revised fempop bypass graft on 07/06/18. Post-op, patient has had RUQ abdominal pain, nausea, and abdominal distention. WBC this AM markedly elevated at 41.1, down to 34.7 this PM. Pharmacy has been consulted for Zosyn dosing for possible intra-abdominal infection. Patient is AF. Scr trending down, now 1.38 and estimated CrCl ~39 mL/min.  Plan: Zosyn 3.375g IV q8h F/u infectious w/u, clinical status, renal function, de-escalation, LOT  Height: 5\' 5"  (165.1 cm) Weight: 154 lb 12.2 oz (70.2 kg) IBW/kg (Calculated) : 61.5  Temp (24hrs), Avg:97.7 F (36.5 C), Min:97.7 F (36.5 C), Max:97.7 F (36.5 C)  Recent Labs  Lab 07/15/18 0247 07/15/18 0929 07/15/18 1642 07/16/18 0353 07/17/18 0615 07/17/18 0859 07/17/18 1144 07/17/18 1714  WBC 12.4*  --   --  19.3* 41.1*  --  41.1* 34.7*  CREATININE 2.83* 2.50* 2.32* 1.76*  --  1.38*  --   --     Estimated Creatinine Clearance: 39 mL/min (A) (by C-G formula based on SCr of 1.38 mg/dL (H)).    Allergies  Allergen Reactions  . Asa [Aspirin] Other (See Comments)    GI bleeding  . Gadolinium Derivatives Other (See Comments)    Pt complained of face flushing/hottness and throat tightness/scratchiness immediately after the injections.  Within 4 minutes, all symptoms were gone and the study was completed.  No further complications or signs of allergy were exhibited after completion of study.   . Oxycodone-Acetaminophen Other (See Comments)    Dizziness, uncomfortable    Antimicrobials this admission: Zosyn 1/19 >>  Microbiology results: None  Thank you for allowing pharmacy to be a part of this patient's care.  Mila Merry Gerarda Fraction, PharmD, [place] PGY2 Infectious Diseases Pharmacy Resident Phone: [phone] 07/17/2018 7:59 PM

## 2018-07-17 NOTE — Progress Notes (Addendum)
Progress Note    07/17/2018 7:55 AM 11 Days Post-Op  Subjective:  Awake; says his abdomen feels better  Afebrile HR 110's-130's  937'T-024'O systolic 97% RA  Vitals:   07/16/18 1941 07/17/18 0734  BP: (!) 149/55 138/70  Pulse: (!) 128 (!) 108  Resp: 18 16  Temp: 98.1 F (36.7 C) 97.7 F (36.5 C)  SpO2: 91% 98%    Physical Exam: Cardiac:  tachy Lungs:  Non labored Incisions:  Laparotomy incision is clean and dry and healing nicely as well as left groin.  Right groin incision looks fine, however, there has been copious amounts of serous drainage since Pravena removed.  Extremities:  +palpable left DP pulse Abdomen:  Still distended but less tender to palpation;  -BM with suppository; decreased BS; RN reports some flatus while he was on toilet.  CBC    Component Value Date/Time   WBC PENDING 07/17/2018 0615   RBC 2.84 (L) 07/17/2018 0615   HGB 8.7 (L) 07/17/2018 0615   HCT 26.8 (L) 07/17/2018 0615   PLT 519 (H) 07/17/2018 0615   MCV 94.4 07/17/2018 0615   MCH 30.6 07/17/2018 0615   MCHC 32.5 07/17/2018 0615   RDW 15.1 07/17/2018 0615   LYMPHSABS 1,722 04/10/2016 0851   MONOABS 656 04/10/2016 0851   EOSABS 246 04/10/2016 0851   BASOSABS 82 04/10/2016 0851    BMET    Component Value Date/Time   NA 131 (L) 07/16/2018 0353   K 4.7 07/16/2018 0353   CL 99 07/16/2018 0353   CO2 22 07/16/2018 0353   GLUCOSE 332 (H) 07/16/2018 0353   BUN 22 07/16/2018 0353   CREATININE 1.76 (H) 07/16/2018 0353   CREATININE 1.33 (H) 04/10/2016 0851   CALCIUM 8.0 (L) 07/16/2018 0353   GFRNONAA 36 (L) 07/16/2018 0353   GFRAA 42 (L) 07/16/2018 0353    INR    Component Value Date/Time   INR 1.25 07/06/2018 1600     Intake/Output Summary (Last 24 hours) at 07/17/2018 0755 Last data filed at 07/16/2018 1736 Gross per 24 hour  Intake 1100 ml  Output -  Net 1100 ml     Assessment:  78 y.o. male is s/p:  aortobifemoral bypass grafting  11 Days Post-Op  Plan: -GI:  pt  continues to have abdominal distension, but physical exam today is improved with less tenderness      -RUQ u/s reveals that there is no pericholecystic fluid and no shadowing stones.  Possible dependent sludge and no sonographic Murphy's sign and overall findings argue against cholecystitis.GI      -no BM with suppository; has had decreased po intake; RN reports flatus while pt on toilet. -Cardiac:  Tachycardic:  On po BB, will change to IV given npo -Pulmonary:  Oxygen saturations on room air 98%; there is small right pleural effusion on KUB -Renal:  Renal function improved yesterday:  Labs still pending for today; good UOP yesterday -Incisions:  Copious amounts of serous drainage from proximal portion of right groin (dressing changed 6x overnight) - will place incisional wound vac and change T/T/S.  Other incisions are clean and dry -nutrition:  TPN started yesterday -heme:  hgb stable other labs pending for this morning. -DVT prophylaxis:  Sq heparin   [name], PA-C [facility] [phone] 07/17/2018 7:55 AM  I agree with the above.  Have seen and evaluated the patient.  He appears to be a little better today.  CT scan showed no acute changes yesterday.  Gallbladder  ultrasound was not consistent with acute cholecystitis.  He is having drainage from his right groin incision and so a incisional wound VAC will be placed.  Urinalysis was unremarkable.  The Praveena on the left groin was removed.  [name]

## 2018-07-17 NOTE — Progress Notes (Signed)
Patient has been alarming due to loss oxygen saturation. Applied nasal cannula with 2 liter of oxygen, however patient refuses to wear because "it's uncomfortable". Patient educated on the need to keep his oxygen saturation above 90 and the consequence of low oxygen levels. Will continue to monitor and encourage patient to follow the physician ordered treatment plan.

## 2018-07-17 NOTE — Progress Notes (Signed)
Patient resting WBC elevated, will repeat labs to confirm.  Christian Bailey

## 2018-07-17 NOTE — Progress Notes (Signed)
WBC remains very elevated.  No obvious source to date.  CT scan yesterday did not indicate a intra-abdominal problem, and was consistent with ileus, given air throughout the colon.  He continues to pass flatus. RUQ u/s negative for acute cholecystitis.  I will place NG tube to help decompress his abdomen.  I Will start IV Abx.  He may need a oral contrasted (via NGT) CT scan tomorrow if he has not improved.  Given his clinical picture, he may require exploratory laparotomy if he does not improve.  Wife updated  [name]

## 2018-07-18 ENCOUNTER — Encounter (HOSPITAL_COMMUNITY): Payer: Medicare Other

## 2018-07-18 ENCOUNTER — Inpatient Hospital Stay (HOSPITAL_COMMUNITY): Payer: Medicare Other

## 2018-07-18 ENCOUNTER — Ambulatory Visit: Payer: Medicare Other | Admitting: Family

## 2018-07-18 LAB — DIFFERENTIAL
Abs Immature Granulocytes: 0.42 10*3/uL — ABNORMAL HIGH (ref 0.00–0.07)
Basophils Absolute: 0 10*3/uL (ref 0.0–0.1)
Basophils Relative: 0 %
EOS ABS: 0 10*3/uL (ref 0.0–0.5)
EOS PCT: 0 %
Immature Granulocytes: 2 %
Lymphocytes Relative: 2 %
Lymphs Abs: 0.7 10*3/uL (ref 0.7–4.0)
Monocytes Absolute: 1.9 10*3/uL — ABNORMAL HIGH (ref 0.1–1.0)
Monocytes Relative: 7 %
Neutro Abs: 23.9 10*3/uL — ABNORMAL HIGH (ref 1.7–7.7)
Neutrophils Relative %: 89 %

## 2018-07-18 LAB — CBC
HCT: 24.9 % — ABNORMAL LOW (ref 39.0–52.0)
Hemoglobin: 7.5 g/dL — ABNORMAL LOW (ref 13.0–17.0)
MCH: 27.7 pg (ref 26.0–34.0)
MCHC: 30.1 g/dL (ref 30.0–36.0)
MCV: 91.9 fL (ref 80.0–100.0)
PLATELETS: 478 10*3/uL — AB (ref 150–400)
RBC: 2.71 MIL/uL — ABNORMAL LOW (ref 4.22–5.81)
RDW: 14.6 % (ref 11.5–15.5)
WBC: 27 10*3/uL — AB (ref 4.0–10.5)
nRBC: 0 % (ref 0.0–0.2)

## 2018-07-18 LAB — GLUCOSE, CAPILLARY
Glucose-Capillary: 144 mg/dL — ABNORMAL HIGH (ref 70–99)
Glucose-Capillary: 177 mg/dL — ABNORMAL HIGH (ref 70–99)
Glucose-Capillary: 180 mg/dL — ABNORMAL HIGH (ref 70–99)
Glucose-Capillary: 210 mg/dL — ABNORMAL HIGH (ref 70–99)
Glucose-Capillary: 216 mg/dL — ABNORMAL HIGH (ref 70–99)
Glucose-Capillary: 236 mg/dL — ABNORMAL HIGH (ref 70–99)

## 2018-07-18 LAB — COMPREHENSIVE METABOLIC PANEL
ALBUMIN: 2 g/dL — AB (ref 3.5–5.0)
ALT: 16 U/L (ref 0–44)
AST: 31 U/L (ref 15–41)
Alkaline Phosphatase: 86 U/L (ref 38–126)
Anion gap: 10 (ref 5–15)
BILIRUBIN TOTAL: 1 mg/dL (ref 0.3–1.2)
BUN: 30 mg/dL — ABNORMAL HIGH (ref 8–23)
CO2: 24 mmol/L (ref 22–32)
Calcium: 7.8 mg/dL — ABNORMAL LOW (ref 8.9–10.3)
Chloride: 101 mmol/L (ref 98–111)
Creatinine, Ser: 1.61 mg/dL — ABNORMAL HIGH (ref 0.61–1.24)
GFR calc Af Amer: 47 mL/min — ABNORMAL LOW (ref >60)
GFR, EST NON AFRICAN AMERICAN: 41 mL/min — AB (ref >60)
Glucose, Bld: 135 mg/dL — ABNORMAL HIGH (ref 70–99)
Potassium: 4.1 mmol/L (ref 3.5–5.1)
Sodium: 135 mmol/L (ref 135–145)
Total Protein: 5.3 g/dL — ABNORMAL LOW (ref 6.5–8.1)

## 2018-07-18 LAB — MAGNESIUM: Magnesium: 1.9 mg/dL (ref 1.7–2.4)

## 2018-07-18 LAB — PREALBUMIN: Prealbumin: <5 mg/dL — ABNORMAL LOW (ref 18–38)

## 2018-07-18 LAB — PHOSPHORUS: Phosphorus: 3.6 mg/dL (ref 2.5–4.6)

## 2018-07-18 LAB — TRIGLYCERIDES: Triglycerides: 51 mg/dL (ref <150)

## 2018-07-18 MED ORDER — LIDOCAINE VISCOUS HCL 2 % MT SOLN
15.0000 mL | Freq: Once | OROMUCOSAL | Status: AC
  Administered 2018-07-18: 5 mL via OROMUCOSAL

## 2018-07-18 MED ORDER — LIDOCAINE VISCOUS HCL 2 % MT SOLN
OROMUCOSAL | Status: AC
  Administered 2018-07-18: 5 mL via OROMUCOSAL
  Filled 2018-07-18: qty 15

## 2018-07-18 MED ORDER — TRAVASOL 10 % IV SOLN
INTRAVENOUS | Status: AC
  Administered 2018-07-18: 18:00:00 via INTRAVENOUS
  Filled 2018-07-18: qty 1209.6

## 2018-07-18 NOTE — Progress Notes (Signed)
Physical Therapy Treatment Patient Details Name: Christian Bailey MRN: [id number] DOB: 11/25/1940 Today's Date: 07/18/2018    History of Present Illness Pt s/p aortobifemoral bypass and redo of rt fem pop bypass on 07/06/18. PMH - rt reverse total shoulder, HTN, DM, OA, PAD, lower extremity bypass    PT Comments    Patient tolerated OOB activity today, performed in conjunction with functional tasks. Patient with elevated HR into 140s and complains of nausea at end of session limiting further ambulation. Current POC remains appropriate.   Follow Up Recommendations  Home health PT;Supervision for mobility/OOB     Equipment Recommendations  Other (comment)(rollator)    Recommendations for Other Services       Precautions / Restrictions Precautions Precautions: Fall Precaution Comments: wound vac Restrictions Weight Bearing Restrictions: No    Mobility  Bed Mobility Overal bed mobility: Needs Assistance Bed Mobility: Rolling;Sidelying to Sit Rolling: Supervision Sidelying to sit: Mod assist     Sit to sidelying: Min assist General bed mobility comments: moderate assist to elevate trunk to upright, min assist to elevate LEs back to bed  Transfers Overall transfer level: Needs assistance Equipment used: Rolling walker (2 wheeled) Transfers: Sit to/from [facility] Sit to Stand: Min assist Stand pivot transfers: Min assist       General transfer comment: increased time and effort today, cues for hand placement and positioning  Ambulation/Gait Ambulation/Gait assistance: Min guard Gait Distance (Feet): 24 Feet Assistive device: Rolling walker (2 wheeled) Gait Pattern/deviations: Step-through pattern;Decreased stride length;Trunk flexed Gait velocity: decreased Gait velocity interpretation: <1.8 ft/sec, indicate of risk for recurrent falls General Gait Details: assist for stability, very shaky with balance despite RW   Stairs              Wheelchair Mobility    Modified Rankin (Stroke Patients Only)       Balance Overall balance assessment: Needs assistance   Sitting balance-Leahy Scale: Good     Standing balance support: Bilateral upper extremity supported Standing balance-Leahy Scale: Poor Standing balance comment: reliance on UE support, posterior LOB x2                            Cognition Arousal/Alertness: Awake/alert Behavior During Therapy: WFL for tasks assessed/performed Overall Cognitive Status: Impaired/Different from baseline Area of Impairment: Problem solving                             Problem Solving: Slow processing;Difficulty sequencing;Requires verbal cues;Requires tactile cues        Exercises      General Comments        Pertinent Vitals/Pain Pain Assessment: Faces Faces Pain Scale: Hurts even more Pain Location: upper abdomen Pain Descriptors / Indicators: Grimacing;Other (Comment)(holding breath) Pain Intervention(s): Monitored during session    Home Living                      Prior Function            PT Goals (current goals can now be found in the care plan section) Acute Rehab PT Goals Patient Stated Goal: to return home PT Goal Formulation: With patient Time For Goal Achievement: 08/14/18 Potential to Achieve Goals: Good Progress towards PT goals: Progressing toward goals(modest progression)    Frequency    Min 3X/week      PT Plan Current plan remains appropriate    Co-evaluation PT/OT/SLP  Co-Evaluation/Treatment: Yes Reason for Co-Treatment: For patient/therapist safety PT goals addressed during session: Mobility/safety with mobility OT goals addressed during session: ADL's and self-care      AM-PAC PT "6 Clicks" Mobility   Outcome Measure  Help needed turning from your back to your side while in a flat bed without using bedrails?: None Help needed moving from lying on your back to sitting on the side of  a flat bed without using bedrails?: A Little Help needed moving to and from a bed to a chair (including a wheelchair)?: A Little Help needed standing up from a chair using your arms (e.g., wheelchair or bedside chair)?: A Little Help needed to walk in hospital room?: A Little Help needed climbing 3-5 steps with a railing? : A Lot 6 Click Score: 18    End of Session   Activity Tolerance: Patient tolerated treatment well;Other (comment)(some nausea limiting ambulation) Patient left: Other (comment);with call bell/phone within reach(sitting EOB) Nurse Communication: Mobility status;Other (comment)(high HR, skin tear) PT Visit Diagnosis: Other abnormalities of gait and mobility (R26.89)     Time: 1916-6060 PT Time Calculation (min) (ACUTE ONLY): 34 min  Charges:  $Therapeutic Activity: 8-22 mins                     Alben Deeds, PT DPT  Board Certified Neurologic Specialist [place] Pager [phone] Office [phone]'    [name] 07/18/2018, 8:56 AM

## 2018-07-18 NOTE — Progress Notes (Addendum)
  Progress Note    07/18/2018 7:59 AM 12 Days Post-Op  Subjective:  Feels a little better.    Vitals not taken since yesterday morning.   Vitals:   07/17/18 1843 07/17/18 1844  BP:    Pulse:    Resp: 14 13  Temp:    SpO2:      Physical Exam: Cardiac:  tachy Lungs:  Non labored Incisions:  Right groin with less drainage but continues to drain.  Other incisions are healing nicely Abdomen:  Soft, still distended but less; +BS  CBC    Component Value Date/Time   WBC 27.0 (H) 07/18/2018 0437   RBC 2.71 (L) 07/18/2018 0437   HGB 7.5 (L) 07/18/2018 0437   HCT 24.9 (L) 07/18/2018 0437   PLT 478 (H) 07/18/2018 0437   MCV 91.9 07/18/2018 0437   MCH 27.7 07/18/2018 0437   MCHC 30.1 07/18/2018 0437   RDW 14.6 07/18/2018 0437   LYMPHSABS 0.7 07/18/2018 0437   MONOABS 1.9 (H) 07/18/2018 0437   EOSABS 0.0 07/18/2018 0437   BASOSABS 0.0 07/18/2018 0437    BMET    Component Value Date/Time   NA 135 07/18/2018 0437   K 4.1 07/18/2018 0437   CL 101 07/18/2018 0437   CO2 24 07/18/2018 0437   GLUCOSE 135 (H) 07/18/2018 0437   BUN 30 (H) 07/18/2018 0437   CREATININE 1.61 (H) 07/18/2018 0437   CREATININE 1.33 (H) 04/10/2016 0851   CALCIUM 7.8 (L) 07/18/2018 0437   GFRNONAA 41 (L) 07/18/2018 0437   GFRAA 47 (L) 07/18/2018 0437    INR    Component Value Date/Time   INR 1.25 07/06/2018 1600     Intake/Output Summary (Last 24 hours) at 07/18/2018 0759 Last data filed at 07/17/2018 1844 Gross per 24 hour  Intake 0 ml  Output 1500 ml  Net -1500 ml     Assessment:  78 y.o. male is s/p:  S/p aortobifemoral bypass grafting  12 Days Post-Op  Plan: -pt with leukocytosis that is still elevated this morning but improved from yesterday.  Continue IV abx -will get CT scan of abdomen and pelvis today with oral contrast only.  Radiology for advancement of NGT.   -abdomen with less distension and less pain to palpation today.  -acute blood loss anemia-hgb down to 7.5 today  but will hold on transfusion fo now.  -continue TPN    [name], PA-C [facility] [phone] 07/18/2018 7:59 AM   Agree with the above.  Feels slightly better after NG tube.  Will have radiology advance NGT.  Plan for CT abd/pelvis with POcontrast.    Christian Bailey

## 2018-07-18 NOTE — Progress Notes (Signed)
Occupational Therapy Treatment Patient Details Name: Christian Bailey MRN: [id number] DOB: Aug 01, 1940 Today's Date: 07/18/2018    History of present illness Pt s/p aortobifemoral bypass and redo of rt fem pop bypass on 07/06/18. Hospital course complicated by ileus, NGT place, pt receiving TPN. PMH - rt reverse total shoulder, HTN, DM, OA, PAD, lower extremity bypass   OT comments  Pt with increased weakness and nausea when NGT clamped. Unsteady with LOB x 2 with ambulation with RW. HR in 140s with effortful attempt to have BM on 3 in 1 over toilet. Pt with increased difficulty problem solving, requiring assistance to place phone call to wife. Pt remains hopeful for a home discharge. Will continue to follow.  Follow Up Recommendations  Home health OT    Equipment Recommendations       Recommendations for Other Services      Precautions / Restrictions Precautions Precautions: Fall Precaution Comments: NGT Restrictions Weight Bearing Restrictions: No       Mobility Bed Mobility Overal bed mobility: Needs Assistance Bed Mobility: Rolling;Sidelying to Sit;Sit to Sidelying Rolling: Supervision Sidelying to sit: Mod assist     Sit to sidelying: Min assist General bed mobility comments: moderate assist to elevate trunk to upright, min assist to elevate LEs back to bed  Transfers Overall transfer level: Needs assistance Equipment used: Rolling walker (2 wheeled) Transfers: Sit to/from Stand Sit to Stand: Min assist Stand pivot transfers: Min assist       General transfer comment: increased time and effort today, cues for hand placement and positioning    Balance Overall balance assessment: Needs assistance   Sitting balance-Leahy Scale: Good     Standing balance support: Bilateral upper extremity supported Standing balance-Leahy Scale: Poor Standing balance comment: reliance on UE support, posterior LOB x2                           ADL either performed or  assessed with clinical judgement   ADL Overall ADL's : Needs assistance/impaired Eating/Feeding: NPO               Upper Body Dressing : Minimal assistance;Sitting Upper Body Dressing Details (indicate cue type and reason): front opening gown     Toilet Transfer: Minimal assistance;Ambulation;RW;BSC;+2 for safety/equipment   Toileting- Clothing Manipulation and Hygiene: Total assistance;Sit to/from stand       Functional mobility during ADLs: Minimal assistance;Rolling walker;+2 for safety/equipment General ADL Comments: pt with nausea after toileting limiting further activity     Vision       Perception     Praxis      Cognition Arousal/Alertness: Awake/alert Behavior During Therapy: WFL for tasks assessed/performed Overall Cognitive Status: Impaired/Different from baseline Area of Impairment: Problem solving                             Problem Solving: Slow processing;Difficulty sequencing;Requires verbal cues;Requires tactile cues General Comments: pt confused by date, but did recall at end of session, assist to place phone call to wife        Exercises     Shoulder Instructions       General Comments      Pertinent Vitals/ Pain       Pain Assessment: Faces Faces Pain Scale: Hurts even more Pain Location: upper abdomen Pain Descriptors / Indicators: Grimacing;Other (Comment);Guarding Pain Intervention(s): Monitored during session  Home Living Family/patient expects to be discharged to:: Private  residence Living Arrangements: Spouse/significant other                                      Prior Functioning/Environment              Frequency  Min 2X/week        Progress Toward Goals  OT Goals(current goals can now be found in the care plan section)  Progress towards OT goals: Not progressing toward goals - comment(increased weakness)  Acute Rehab OT Goals Patient Stated Goal: to return home OT Goal  Formulation: With patient Time For Goal Achievement: 07/22/18 Potential to Achieve Goals: Good  Plan Discharge plan remains appropriate    Co-evaluation    PT/OT/SLP Co-Evaluation/Treatment: Yes Reason for Co-Treatment: For patient/therapist safety PT goals addressed during session: Mobility/safety with mobility OT goals addressed during session: ADL's and self-care      AM-PAC OT "6 Clicks" Daily Activity     Outcome Measure   Help from another person eating meals?: Total Help from another person taking care of personal grooming?: A Lot Help from another person toileting, which includes using toliet, bedpan, or urinal?: Total Help from another person bathing (including washing, rinsing, drying)?: A Lot Help from another person to put on and taking off regular upper body clothing?: A Little Help from another person to put on and taking off regular lower body clothing?: A Lot 6 Click Score: 11    End of Session Equipment Utilized During Treatment: Gait belt;Rolling walker  OT Visit Diagnosis: Unsteadiness on feet (R26.81);Other abnormalities of gait and mobility (R26.89);Pain   Activity Tolerance Treatment limited secondary to medical complications (Comment)(nausea with NGT clamped)   Patient Left in bed;with call bell/phone within reach;with nursing/sitter in room(to go to xray)   Nurse Communication          Time: 2924-4628 OT Time Calculation (min): 37 min  Charges: OT General Charges $OT Visit: 1 Visit OT Treatments $Self Care/Home Management : 8-22 mins  Nestor Lewandowsky, OTR/L Acute Rehabilitation Services Pager: [phone] Office: [phone]   Christian Bailey 07/18/2018, 10:10 AM

## 2018-07-18 NOTE — Progress Notes (Signed)
PHARMACY - ADULT TOTAL PARENTERAL NUTRITION CONSULT NOTE   Pharmacy Consult:  TPN Indication: Prolonged ileus  Patient Measurements: Height: 5\' 5"  (165.1 cm) Weight: 154 lb 12.2 oz (70.2 kg) IBW/kg (Calculated) : 61.5 TPN AdjBW (KG): 68.9 Body mass index is 25.75 kg/m. Usual Weight: 68.9 kg  Assessment:  27 YOM admitted with recurrent claudication s/p aortobifemoral bypass graft and revised right fem-pop bypass graft on 07/06/18. Clears initiated 1/14 and diet was advanced on 1/16 but patient has had poor intake (0-25% recorded). On 1/17, patient began having RUQ abdominal pain with nausea and sluggish bowel sounds. Symptoms continued to worsen POD#10 (1/18) including increased nausea with sensation to vomit and increased abdominal distension. Patient was receiving dextrose in IVF during this time. Patient was made NPO and pharmacy was consulted to mange TPN for prolonged ileus.   GI: prealbumin <5, amylase 118, LBM 1/15, +flatus. PPI IV, colace.  RUQ [place] overall negative.  Wound vac to be placed on right groin given drainage on 1/19 Endo: hx DM on metformin PTA. CBGs 139-221 (trending up, unsure what caused the intermittent high CBGs) Insulin requirements in the past 24 hours: 23 units Lytes: all WNL except Mag 1.9 (goal >/= 2 for ileus) Renal: AKI - SCr up 1.61 (BL SCr 1.3-1.5), BUN up to 30 - UOP 0.9 ml/kghr, NS at 50/hr, net +2.5L Pulm: stable on RA Cards: chronic RBBB, CAD, PAD, HTN, HLD - BP ok, ST/PVCs - Plavix, IV metoprolol, Crestor Hepatobil: LFTs / tbili / TG WNL. Neuro: tramadol, pain score 4 ID: Zosyn (1/19 >> ) for IAI - afebrile, WBC down to 27, no micro TPN Access: PICC 07/16/18 TPN start date: 07/16/18  Nutritional Goals (per RD rec on 1/18): 2000-2200 kCal, 105-125g protein, 2-2.2L fluid per day  Current Nutrition:  TPN  Plan:  Increase TPN to goal rate of 80 ml/hr TPN will provide 121g AA, 269g CHO and 62g ILE for a total of 2013 kCal, meeting 100% of patient's  needs  Electrolytes in TPN: Na/Mag incr 1/19, max CL - lytes fluctuation with TPN rate increase Daily multivitamin and trace elements in TPN Continue moderate SSI Q4H + add 10 units regular insulin in TPN NS at 50 ml/hr per MD F/U AM labs, CBGs, return of bowel function and ability to start PO diet   Graiden Henes D. Mina Marble, PharmD, BCPS, [place] 07/18/2018, 7:50 AM

## 2018-07-19 ENCOUNTER — Encounter (HOSPITAL_COMMUNITY): Payer: Self-pay | Admitting: General Surgery

## 2018-07-19 ENCOUNTER — Telehealth (HOSPITAL_COMMUNITY): Payer: Self-pay | Admitting: *Deleted

## 2018-07-19 ENCOUNTER — Inpatient Hospital Stay (HOSPITAL_COMMUNITY): Payer: Medicare Other

## 2018-07-19 HISTORY — PX: IR PERC CHOLECYSTOSTOMY: IMG2326

## 2018-07-19 LAB — GLUCOSE, CAPILLARY
GLUCOSE-CAPILLARY: 215 mg/dL — AB (ref 70–99)
GLUCOSE-CAPILLARY: 227 mg/dL — AB (ref 70–99)
Glucose-Capillary: 187 mg/dL — ABNORMAL HIGH (ref 70–99)
Glucose-Capillary: 237 mg/dL — ABNORMAL HIGH (ref 70–99)
Glucose-Capillary: 242 mg/dL — ABNORMAL HIGH (ref 70–99)
Glucose-Capillary: 210 mg/dL — ABNORMAL HIGH (ref 70–99)

## 2018-07-19 LAB — BASIC METABOLIC PANEL
Anion gap: 8 (ref 5–15)
BUN: 31 mg/dL — ABNORMAL HIGH (ref 8–23)
CHLORIDE: 103 mmol/L (ref 98–111)
CO2: 23 mmol/L (ref 22–32)
Calcium: 8 mg/dL — ABNORMAL LOW (ref 8.9–10.3)
Creatinine, Ser: 1.55 mg/dL — ABNORMAL HIGH (ref 0.61–1.24)
GFR calc Af Amer: 49 mL/min — ABNORMAL LOW (ref >60)
GFR calc non Af Amer: 43 mL/min — ABNORMAL LOW (ref >60)
Glucose, Bld: 215 mg/dL — ABNORMAL HIGH (ref 70–99)
Potassium: 3.5 mmol/L (ref 3.5–5.1)
Sodium: 134 mmol/L — ABNORMAL LOW (ref 135–145)

## 2018-07-19 LAB — CBC
HCT: 25 % — ABNORMAL LOW (ref 39.0–52.0)
Hemoglobin: 8.2 g/dL — ABNORMAL LOW (ref 13.0–17.0)
MCH: 30.4 pg (ref 26.0–34.0)
MCHC: 32.8 g/dL (ref 30.0–36.0)
MCV: 92.6 fL (ref 80.0–100.0)
NRBC: 0 % (ref 0.0–0.2)
Platelets: 567 10*3/uL — ABNORMAL HIGH (ref 150–400)
RBC: 2.7 MIL/uL — ABNORMAL LOW (ref 4.22–5.81)
RDW: 15.1 % (ref 11.5–15.5)
WBC: 22.1 10*3/uL — ABNORMAL HIGH (ref 4.0–10.5)

## 2018-07-19 LAB — TROPONIN I: Troponin I: 0.03 ng/mL (ref <0.03)

## 2018-07-19 LAB — PHOSPHORUS: Phosphorus: 2.4 mg/dL — ABNORMAL LOW (ref 2.5–4.6)

## 2018-07-19 LAB — MAGNESIUM: Magnesium: 2.3 mg/dL (ref 1.7–2.4)

## 2018-07-19 MED ORDER — CEFAZOLIN SODIUM-DEXTROSE 2-4 GM/100ML-% IV SOLN
INTRAVENOUS | Status: AC
  Filled 2018-07-19: qty 100

## 2018-07-19 MED ORDER — CEFAZOLIN SODIUM-DEXTROSE 2-4 GM/100ML-% IV SOLN
2.0000 g | Freq: Once | INTRAVENOUS | Status: AC
  Administered 2018-07-19: 2 g via INTRAVENOUS

## 2018-07-19 MED ORDER — TRAVASOL 10 % IV SOLN
INTRAVENOUS | Status: AC
  Administered 2018-07-19 (×2): via INTRAVENOUS
  Filled 2018-07-19: qty 1209.6

## 2018-07-19 MED ORDER — DILTIAZEM LOAD VIA INFUSION
10.0000 mg | INTRAVENOUS | Status: DC
  Administered 2018-07-19: 10 mg via INTRAVENOUS
  Filled 2018-07-19 (×123): qty 10

## 2018-07-19 MED ORDER — LIDOCAINE HCL 1 % IJ SOLN
INTRAMUSCULAR | Status: AC
  Filled 2018-07-19: qty 20

## 2018-07-19 MED ORDER — MIDAZOLAM HCL 2 MG/2ML IJ SOLN
INTRAMUSCULAR | Status: AC
  Filled 2018-07-19: qty 2

## 2018-07-19 MED ORDER — POTASSIUM CHLORIDE 10 MEQ/50ML IV SOLN
10.0000 meq | INTRAVENOUS | Status: AC
  Administered 2018-07-19 (×3): 10 meq via INTRAVENOUS
  Filled 2018-07-19 (×3): qty 50

## 2018-07-19 MED ORDER — IOPAMIDOL (ISOVUE-300) INJECTION 61%
INTRAVENOUS | Status: AC
  Administered 2018-07-19: 10 mL
  Filled 2018-07-19: qty 50

## 2018-07-19 MED ORDER — SODIUM CHLORIDE 0.9% FLUSH
5.0000 mL | Freq: Three times a day (TID) | INTRAVENOUS | Status: DC
  Administered 2018-07-19 – 2018-07-30 (×25): 5 mL

## 2018-07-19 MED ORDER — FENTANYL CITRATE (PF) 100 MCG/2ML IJ SOLN
INTRAMUSCULAR | Status: AC
  Filled 2018-07-19: qty 2

## 2018-07-19 MED ORDER — HEPARIN SODIUM (PORCINE) 5000 UNIT/ML IJ SOLN
5000.0000 [IU] | Freq: Three times a day (TID) | INTRAMUSCULAR | Status: DC
  Administered 2018-07-20 – 2018-07-30 (×31): 5000 [IU] via SUBCUTANEOUS
  Filled 2018-07-19 (×31): qty 1

## 2018-07-19 MED ORDER — SODIUM PHOSPHATES 45 MMOLE/15ML IV SOLN
10.0000 mmol | Freq: Once | INTRAVENOUS | Status: AC
  Administered 2018-07-19: 10 mmol via INTRAVENOUS
  Filled 2018-07-19: qty 3.33

## 2018-07-19 MED ORDER — MIDAZOLAM HCL 2 MG/2ML IJ SOLN
INTRAMUSCULAR | Status: AC | PRN
  Administered 2018-07-19 (×2): 0.5 mg via INTRAVENOUS
  Administered 2018-07-19: 1 mg via INTRAVENOUS

## 2018-07-19 MED ORDER — TECHNETIUM TC 99M MEBROFENIN IV KIT
5.0000 | PACK | Freq: Once | INTRAVENOUS | Status: AC | PRN
  Administered 2018-07-19: 5 via INTRAVENOUS

## 2018-07-19 MED ORDER — LIDOCAINE HCL 1 % IJ SOLN
INTRAMUSCULAR | Status: AC | PRN
  Administered 2018-07-19: 10 mL

## 2018-07-19 MED ORDER — FENTANYL CITRATE (PF) 100 MCG/2ML IJ SOLN
INTRAMUSCULAR | Status: AC | PRN
  Administered 2018-07-19: 25 ug via INTRAVENOUS
  Administered 2018-07-19: 50 ug via INTRAVENOUS
  Administered 2018-07-19: 25 ug via INTRAVENOUS

## 2018-07-19 MED ORDER — DILTIAZEM HCL-DEXTROSE 100-5 MG/100ML-% IV SOLN (PREMIX)
5.0000 mg/h | INTRAVENOUS | Status: DC
  Administered 2018-07-19 (×2): 5 mg/h via INTRAVENOUS
  Filled 2018-07-19 (×3): qty 100

## 2018-07-19 NOTE — Progress Notes (Signed)
Notified on call Dr. Oneida Alar of patient's HR 130-150's and elevated b/p. CBC and troponin ordered and completed. Patient started on cardizem gtt/bolus given. Rate @5 

## 2018-07-19 NOTE — Care Management Note (Signed)
Case Management Note Marvetta Gibbons RN, BSN Transitions of Care Unit 4E- RN Case Manager [phone]  Patient Details  Name: Christian Bailey MRN: [id number] Date of Birth: Jun 02, 1941  Subjective/Objective:   Pt admitted s/p  Aortobifem bypass, post op coarse complicated by afib and ileus with NGT placement and TPN started                Action/Plan: PTA pt lived at home with wife, CM notified by Jonelle Sidle with [facility], pt has pre-op referral to [facility] per Vascular office for any [facility] needs for transition home. CM to follow for transition of care needs  Expected Discharge Date:                  Expected Discharge Plan:  [place]  In-House Referral:     Discharge planning Services  CM Consult  Post Acute Care Choice:  Home Health Choice offered to:  Patient  DME Arranged:    DME Agency:     HH Arranged:    [place] Agency:  [facility]  Status of Service:  In process, will continue to follow  If discussed at Long Length of Stay Meetings, dates discussed:    Discharge Disposition:   Additional Comments:  Dawayne Patricia, RN 07/19/2018, 10:25 AM

## 2018-07-19 NOTE — Progress Notes (Addendum)
[facility] [place]  Subjective  - Right upper quadrant pain.   Objective (!) 175/67 (!) 124 98.2 F (36.8 C) (Oral) (!) 21 97%  Intake/Output Summary (Last 24 hours) at 07/19/2018 0733 Last data filed at 07/19/2018 0620 Gross per 24 hour  Intake 5661.06 ml  Output 1250 ml  Net 4411.06 ml    Abdomin distended, tenderness RUQ.  Incision well healed  B LE palpable pulses NPO with NGT in place 50 cc OP emesis Heart tachycardia 120-140's   CT with oral contract 07/18/2018 IMPRESSION: 1. Mild increase in size of a right pelvic retroperitoneal hematoma measuring 4.6 x 2.9 cm today. 2. No significant change in a large left pelvic retroperitoneal hematoma. 3. No significant change in moderate-sized bilateral pleural effusions and associated bilateral compressive lower lobe atelectasis. 4. Stable dilated gallbladder with poorly defined margins and mild pericholecystic edema. This could be due to cholecystitis. 5. Mild diffuse gastric wall thickening, accentuated by the lack of distention. 6. Mild increase in size of a small fluid collections adjacent to the aortic graft, as described above. 7. Stable small amount of free peritoneal fluid.   Assessment/Planning: POD #13 Aortobifem   Right UQ abdominal pain.  We will order an ultrasound to check on his gallbladder.    HGB stable at 8.2 for now WBC 22.1 today, Afebrile We will get further work up.  As a last resort he may need to return to the OR for reexploration of the abdomin.    [name] 07/19/2018 7:33 AM --  Laboratory Lab Results: Recent Labs    07/18/18 0437 07/19/18 0522  WBC 27.0* 22.1*  HGB 7.5* 8.2*  HCT 24.9* 25.0*  PLT 478* 567*   BMET Recent Labs    07/18/18 0437 07/19/18 0432  NA 135 134*  K 4.1 3.5  CL 101 103  CO2 24 23  GLUCOSE 135* 215*  BUN 30* 31*  CREATININE 1.61* 1.55*  CALCIUM 7.8* 8.0*    COAG Lab Results  Component Value Date   INR  1.25 07/06/2018   INR 1.04 07/01/2018   INR 1.04 09/06/2017   No results found for: PTT

## 2018-07-19 NOTE — Sedation Documentation (Signed)
Procedure continues. Pt tolerating well.

## 2018-07-19 NOTE — Sedation Documentation (Signed)
Patient is resting comfortably. 

## 2018-07-19 NOTE — Progress Notes (Signed)
While at bedside doing patient care, patient's physician entered room and notified him of the results of hida scan and that patient will be placed on schedule for drain tube to be placed in IR. Called patient's wife at patient's request and notified her of updates.

## 2018-07-19 NOTE — Progress Notes (Signed)
    Subjective  - POD #13  Tachy overnight, started on dilt drip Pain in RUQ is worse   Physical Exam:  Very tender RUQ abd much softer today Vac in right groin       Assessment/Plan:  POD #13  Keep NPO with TPN and NGT Plan for RUQ u/s.  If negative will discuss with GI about limited colonoscopy to better eval for bowel ischemia Continue IV abx OOB  Wells Holden Maniscalco 07/19/2018 7:43 AM --  Vitals:   07/19/18 0022 07/19/18 0051  BP: (!) 155/65 (!) 175/67  Pulse: (!) 111 (!) 124  Resp: 20 (!) 21  Temp: 98.2 F (36.8 C)   SpO2: 94% 97%    Intake/Output Summary (Last 24 hours) at 07/19/2018 0743 Last data filed at 07/19/2018 9024 Gross per 24 hour  Intake 5661.06 ml  Output 1250 ml  Net 4411.06 ml     Laboratory CBC    Component Value Date/Time   WBC 22.1 (H) 07/19/2018 0522   HGB 8.2 (L) 07/19/2018 0522   HCT 25.0 (L) 07/19/2018 0522   PLT 567 (H) 07/19/2018 0522    BMET    Component Value Date/Time   NA 134 (L) 07/19/2018 0432   K 3.5 07/19/2018 0432   CL 103 07/19/2018 0432   CO2 23 07/19/2018 0432   GLUCOSE 215 (H) 07/19/2018 0432   BUN 31 (H) 07/19/2018 0432   CREATININE 1.55 (H) 07/19/2018 0432   CREATININE 1.33 (H) 04/10/2016 0851   CALCIUM 8.0 (L) 07/19/2018 0432   GFRNONAA 43 (L) 07/19/2018 0432   GFRAA 49 (L) 07/19/2018 0432    COAG Lab Results  Component Value Date   INR 1.25 07/06/2018   INR 1.04 07/01/2018   INR 1.04 09/06/2017   No results found for: PTT  Antibiotics Anti-infectives (From admission, onward)   Start     Dose/Rate Route Frequency Ordered Stop   07/17/18 2000  metroNIDAZOLE (FLAGYL) IVPB 500 mg  Status:  Discontinued     500 mg 100 mL/hr over 60 Minutes Intravenous Every 6 hours 07/17/18 1956 07/17/18 1959   07/17/18 2000  piperacillin-tazobactam (ZOSYN) IVPB 3.375 g     3.375 g 12.5 mL/hr over 240 Minutes Intravenous Every 8 hours 07/17/18 1959     07/06/18 2200  ceFAZolin (ANCEF) IVPB 2g/100 mL premix       2 g 200 mL/hr over 30 Minutes Intravenous Every 8 hours 07/06/18 1840 07/07/18 0548   07/06/18 0730  ceFAZolin (ANCEF) IVPB 2g/100 mL premix     2 g 200 mL/hr over 30 Minutes Intravenous To ShortStay Surgical 07/05/18 1140 07/06/18 1443   07/06/18 0700  vancomycin (VANCOCIN) IVPB 1000 mg/200 mL premix     1,000 mg 200 mL/hr over 60 Minutes Intravenous To ShortStay Surgical 07/05/18 1154 07/06/18 1006       V. Leia Alf, M.D. [facility] [place] Office: [phone] Pager:  [phone]

## 2018-07-19 NOTE — Sedation Documentation (Signed)
Vital signs stable. 

## 2018-07-19 NOTE — Progress Notes (Addendum)
PHARMACY - ADULT TOTAL PARENTERAL NUTRITION CONSULT NOTE   Pharmacy Consult:  TPN Indication: Prolonged ileus  Patient Measurements: Height: 5\' 5"  (165.1 cm) Weight: 154 lb 12.2 oz (70.2 kg) IBW/kg (Calculated) : 61.5 TPN AdjBW (KG): 68.9 Body mass index is 25.75 kg/m. Usual Weight: 68.9 kg  Assessment:  74 YOM admitted with recurrent claudication s/p aortobifemoral bypass graft and revised right fem-pop bypass graft on 07/06/18. Clears initiated 1/14 and diet was advanced on 1/16 but patient has had poor intake (0-25% recorded). On 1/17, patient began having RUQ abdominal pain with nausea and sluggish bowel sounds. Symptoms continued to worsen POD#10 (1/18) including increased nausea with sensation to vomit and increased abdominal distension. Patient was receiving dextrose in IVF during this time. Patient was made NPO and pharmacy was consulted to mange TPN for prolonged ileus.   GI: prealbumin <5, amylase 118, LBM 1/15, +flatus, NG O/P 22mL, right groin drainage 185mL.  PPI IV, docusate.  1/20 CT - right pelvic RP hematoma bigger Endo: hx DM on metformin PTA - CBGs elevated Insulin requirements in the past 24 hours: 23 units + 10 units in TPN Lytes: K down 3.5 (goal >/=4 for ileus), low Na, Mag high normal Renal: AKI - SCr down 1.55 (BL SCr 1.3-1.5), BUN up to 31 - UOP 0.7 ml/kghr, NS at 50/hr, net +6.9L Pulm: stable on RA Cards: chronic RBBB, CAD, PAD, HTN, HLD - BP elevated, tachy - Plavix, IV metoprolol, Crestor, diltiazem gtt, PRN hydralazine/metoprolol Hepatobil: LFTs / tbili / TG WNL. Neuro: tramadol, pain score 0 ID: Zosyn (1/19 >> ) for IAI - afebrile, WBC down to 22.1, no micro TPN Access: PICC 07/16/18 TPN start date: 07/16/18  Nutritional Goals (per RD rec on 1/18): 2000-2200 kCal, 105-125g protein, 2-2.2L fluid per day  Current Nutrition:  TPN  Plan:  Continue TPN at goal rate of 80 ml/hr, providing 121g AA, 269g CHO and 62g ILE for a total of 2013 kCal, meeting 100%  of patient's needs  Electrolytes in TPN: incr Na/K/Phos, reduce Mag slightly, max CL Daily multivitamin and trace elements in TPN Continue moderate SSI Q4H + increase regular insulin in TPN to 20 units NS at 50 ml/hr per MD NaPhos 10 mmol IV x 1 KCL x 3 runs F/U AM labs, CBGs, U/S vs limited colonoscopy   Alyxandra Tenbrink D. Mina Marble, PharmD, BCPS, [place] 07/19/2018, 8:52 AM

## 2018-07-19 NOTE — Progress Notes (Signed)
PT Cancellation Note  Patient Details Name: Christian Bailey MRN: [id number] DOB: 05/04/1941   Cancelled Treatment:    Reason Eval/Treat Not Completed: Patient at procedure or test/unavailable(at Nuc med)   [name] 07/19/2018, 12:35 PM

## 2018-07-19 NOTE — Consult Note (Signed)
Chief Complaint: Patient was seen in consultation today for percutaneous cholecystostomy tube placement.  Referring Physician(s): Saverio Danker, PA-C  Supervising Physician: Arne Cleveland  Patient Status: [facility] - In-pt  History of Present Illness: Christian Bailey is a 78 y.o. male with a past medical history significant for GERD, GI bleed, anemia, RBBB, HTN, HLD and recent aortobifemoral bypass grafting 07/06/18 with [name]. He has had postoperative RUQ abdominal pain which waxes and wanes and happened last time he had surgery, eventually resolving in it's own. However, his RUQ has progressively worsened since approximately 1 week post-op and is associated with leukocytosis and abdominal distention. An NG tube was placed on 1/19 with some symptomatic relief in his abdominal distention however RUQ still persists.  He underwent CT abdomen/pelvis 1/20 which showed a stable dilated gallbladder with poorly defined margins and mild pericholecystic edema - possible due to cholecystitis. RUQ abdominal US was performed today showing large amount of sludge with thickened gallbladder wall (4.2 mm), pericholecystitis fluid noted, no gallstones. General surgery was consulted who ordered HIDA scan with plans to consult IR for percutaneous cholecystostomy placement if positive. HIDA scan performed today showed no gallbladder activity consistent with obstruction of cystic duct and acute cholecystitis. IR was been consulted for percutaneous cholecystostomy placement.   Patient reports RUQ pain with palpation but states "not as bad as earlier." He also reports being unable to take very deep breaths due to some pain. He denies any other complaints today. He states understanding of requested procedure and wishes to proceed.   Past Medical History:  Diagnosis Date  . Anemia   . Complication of anesthesia   . Diabetes mellitus ([place])    TYPE 2  . GERD (gastroesophageal reflux disease)   . GI bleed   . History  of blood transfusion    GI bleed  . History of colon polyps   . History of hiatal hernia   . Hyperlipemia   . Hypertension   . Osteoarthritis   . PAD (peripheral artery disease) ([facility])    a. stenting of his left common iliac artery >20 years ago. b. h/o LEIA stent and 2 stents to R SFA in 2011. c. 04/2014:  s/p PTA of right SFA for in-stent restenosis, occluded left SFA  . PONV (postoperative nausea and vomiting)    no porblem with the last 3 surgeries  . RBBB (right bundle branch block with left anterior fascicular block)    NUCLEAR STRESS TEST, 08/18/2010 - no significant wall motion abnoramlities noted, post-stress EF 69%, normal myocardial perfusion study  . Sinus tachycardia    a. Noted during admission 04/2014 but upon review seems to be frequent finding for patient.  . Stenosis of carotid artery    a. 50% right carotid stenosis by angiogram 04/2014.    Past Surgical History:  Procedure Laterality Date  . ABDOMINAL AORTOGRAM W/LOWER EXTREMITY N/A 01/27/2017   Procedure: Abdominal Aortogram w/Lower Extremity;  Surgeon: Serafina Mitchell, MD;  Location: [place] [facility];  Service: Cardiovascular;  Laterality: N/A;  . ABDOMINAL AORTOGRAM W/LOWER EXTREMITY N/A 06/08/2017   Procedure: ABDOMINAL AORTOGRAM W/LOWER EXTREMITY;  Surgeon: Serafina Mitchell, MD;  Location: [place] [facility];  Service: Cardiovascular;  Laterality: N/A;  . ABDOMINAL AORTOGRAM W/LOWER EXTREMITY N/A 08/31/2017   Procedure: ABDOMINAL AORTOGRAM W/LOWER EXTREMITY;  Surgeon: Serafina Mitchell, MD;  Location: [place] [facility];  Service: Cardiovascular;  Laterality: N/A;  rt. unilateral  . ANGIOPLASTY / STENTING FEMORAL    . ANGIOPLASTY /  STENTING ILIAC    . AORTA - BILATERAL FEMORAL ARTERY BYPASS GRAFT N/A 07/06/2018   Procedure: AORTA BIFEMORAL BYPASS GRAFT USING 14X7MM X 40CM HEMASHIELD GOLD GRAFT;  Surgeon: Serafina Mitchell, MD;  Location: [place];  Service: Vascular;  Laterality: N/A;  . CEREBRAL ANGIOGRAM N/A 05/14/2014    Procedure: CEREBRAL ANGIOGRAM;  Surgeon: Lorretta Harp, MD;  Location: [facility] CATH LAB;  Service: Cardiovascular;  Laterality: N/A;  . COLONOSCOPY W/ POLYPECTOMY    . ENDARTERECTOMY Right 09/08/2017  . ENDARTERECTOMY FEMORAL Right 09/08/2017   Procedure: REDO RIGHT FEMORAL ENDARTECTOMY WITH PATCH ANGIOPLASTY.;  Surgeon: Serafina Mitchell, MD;  Location: [place];  Service: Vascular;  Laterality: Right;  . EYE SURGERY Bilateral    cataract  . FEMORAL ARTERY STENT Right 05/12/2010   Stented distally with a 6x100 Abbott absolute stent and proximally with a 6x60 Cook Zilver stent resulting in the reduction of the proximal segment 80% and mid segment 60-70% to 0% residual, LEFT common femoral artery stented with a 7x3 Smart stent resulting in reduction of 90% stenosis to 0% residual  . FEMORAL-POPLITEAL BYPASS GRAFT Right 09/18/2016   Procedure: BYPASS GRAFT FEMORAL-POPLITEAL ARTERY;  Surgeon: Serafina Mitchell, MD;  Location: [place];  Service: Vascular;  Laterality: Right;  . FEMORAL-POPLITEAL BYPASS GRAFT Right 09/08/2017   Procedure: REDO BYPASS GRAFT FEMORAL-POPLITEAL ARTERY;  Surgeon: Serafina Mitchell, MD;  Location: [place];  Service: Vascular;  Laterality: Right;  . FEMORAL-POPLITEAL BYPASS GRAFT Right 07/06/2018   Procedure: REVISION RIGHT FEMORAL TO POPLITEAL ARTERY BYPASS GRAFT;  Surgeon: Serafina Mitchell, MD;  Location: [place];  Service: Vascular;  Laterality: Right;  . KNEE ARTHROSCOPY     left  . LOWER EXTREMITY ANGIOGRAM N/A 05/14/2014   Procedure: LOWER EXTREMITY ANGIOGRAM;  Surgeon: Lorretta Harp, MD;  Location: [facility] CATH LAB;  Service: Cardiovascular;  Laterality: N/A;  . LOWER EXTREMITY ANGIOGRAPHY N/A 09/21/2016   Procedure: Lower Extremity Angiography;  Surgeon: Conrad Fountain, MD;  Location: [place] [facility];  Service: Cardiovascular;  Laterality: N/A;  . PERIPHERAL VASCULAR ATHERECTOMY Right 01/27/2017   Procedure: PERIPHERAL VASCULAR ATHERECTOMY;  Surgeon: Serafina Mitchell, MD;  Location: [place] [facility];  Service: Cardiovascular;  Laterality: Right;  . PERIPHERAL VASCULAR BALLOON ANGIOPLASTY Right 06/08/2017   Procedure: PERIPHERAL VASCULAR BALLOON ANGIOPLASTY;  Surgeon: Serafina Mitchell, MD;  Location: [place] [facility];  Service: Cardiovascular;  Laterality: Right;  common femoral and superficial femoral arteries  . PERIPHERAL VASCULAR CATHETERIZATION N/A 04/20/2016   Procedure: Lower Extremity Intervention;  Surgeon: Lorretta Harp, MD;  Location: [place] [facility];  Service: Cardiovascular;  Laterality: N/A;  . PERIPHERAL VASCULAR INTERVENTION  08/31/2017   Procedure: PERIPHERAL VASCULAR INTERVENTION;  Surgeon: Serafina Mitchell, MD;  Location: [place] [facility];  Service: Cardiovascular;;  REIA  . REVERSE SHOULDER ARTHROPLASTY Right 12/07/2016  . REVERSE SHOULDER ARTHROPLASTY Right 12/07/2016   Procedure: REVERSE SHOULDER ARTHROPLASTY;  Surgeon: Netta Cedars, MD;  Location: [place];  Service: Orthopedics;  Laterality: Right;  . ROTATOR CUFF REPAIR Right 2003  . SFA Right 05/14/2014   PTA  OF RT SFA         DR BERRY    Allergies: Asa [aspirin]; Gadolinium derivatives; and Oxycodone-acetaminophen  Medications: Prior to Admission medications   Medication Sig Start Date End Date Taking? Authorizing Provider  amoxicillin (AMOXIL) 500 MG capsule Take 2 grams (4 tablets) by mouth 1 hour prior to procedure. Patient taking differently: Take 2,000 mg  by mouth See admin instructions. Take 2000 mg by mouth 1 hour prior to dental procedure 06/01/18  Yes Serafina Mitchell, MD  clopidogrel (PLAVIX) 75 MG tablet Take 1 tablet (75 mg total) by mouth daily. 05/30/18  Yes Lorretta Harp, MD  Coenzyme Q10 (COQ10 PO) Take 1 capsule by mouth every evening.    Yes [provider]  glucosamine-chondroitin 500-400 MG tablet Take 1 tablet by mouth every evening.    Yes [provider]  lisinopril (PRINIVIL,ZESTRIL) 5 MG tablet Take 5 mg by mouth 2 (two) times daily.    Yes  [provider]  metFORMIN (GLUCOPHAGE) 1000 MG tablet Take 1 tablet (1,000 mg total) by mouth 2 (two) times daily with a meal. 05/16/14  Yes Dunn, Dayna N, PA-C  metoprolol succinate (TOPROL-XL) 25 MG 24 hr tablet Take 25 mg by mouth 2 (two) times daily.    Yes [provider]  Omega-3 Fatty Acids (FISH OIL) 1200 MG CAPS Take 1,200 mg by mouth every evening.    Yes [provider]  pantoprazole (PROTONIX) 40 MG tablet Take 40 mg by mouth twice daily Patient taking differently: Take 40 mg by mouth 2 (two) times daily.  05/30/18  Yes Lorretta Harp, MD  rosuvastatin (CRESTOR) 20 MG tablet Take 20 mg by mouth every evening.    Yes [provider]     Family History  Problem Relation Age of Onset  . Heart disease Mother   . Leukemia Mother   . Stomach cancer Father   . Esophageal cancer Brother   . Liver disease Brother   . Alcoholism Brother   . Leukemia Brother   . Leukemia Brother   . Diabetes type II Brother   . Lung disease Sister     Social History   Socioeconomic History  . Marital status: Married    Spouse name: Not on file  . Number of children: 2  . Years of education: Not on file  . Highest education level: Not on file  Occupational History  . Occupation: retired    Fish farm manager: RETIRED  Social Needs  . Financial resource strain: Not on file  . Food insecurity:    Worry: Not on file    Inability: Not on file  . Transportation needs:    Medical: Not on file    Non-medical: Not on file  Tobacco Use  . Smoking status: Former Smoker    Years: 25.00    Last attempt to quit: 06/1988    Years since quitting: 30.0  . Smokeless tobacco: Never Used  Substance and Sexual Activity  . Alcohol use: Yes    Comment: rarely  . Drug use: No  . Sexual activity: Not on file  Lifestyle  . Physical activity:    Days per week: Not on file    Minutes per session: Not on file  . Stress: Not on file  Relationships  . Social connections:     Talks on phone: Not on file    Gets together: Not on file    Attends religious service: Not on file    Active member of club or organization: Not on file    Attends meetings of clubs or organizations: Not on file    Relationship status: Not on file  Other Topics Concern  . Not on file  Social History Narrative  . Not on file     Review of Systems: A 12 point ROS discussed and pertinent positives are indicated in  the HPI above.  All other systems are negative.  Review of Systems  Constitutional: Negative for chills and fever.  Respiratory: Negative for cough and shortness of breath.   Cardiovascular: Negative for chest pain.  Gastrointestinal: Positive for abdominal pain (RUQ). Negative for diarrhea, nausea and vomiting.  Neurological: Negative for headaches.  Psychiatric/Behavioral: Negative for confusion.    Vital Signs: BP 136/77   Pulse (!) 118   Temp 97.6 F (36.4 C)   Resp (!) 25   Ht 5\' 5"  (1.651 m)   Wt 154 lb 12.2 oz (70.2 kg)   SpO2 97%   BMI 25.75 kg/m   Physical Exam Vitals signs and nursing note reviewed.  Constitutional:      General: He is not in acute distress. HENT:     Head: Normocephalic.  Cardiovascular:     Rate and Rhythm: Tachycardia present. Rhythm irregular.  Pulmonary:     Effort: Pulmonary effort is normal.     Breath sounds: Normal breath sounds.  Abdominal:     General: There is distension.     Palpations: Abdomen is soft.     Tenderness: There is abdominal tenderness (RUQ).     Comments: (+) NG tube  Skin:    General: Skin is warm and dry.     Coloration: Skin is not jaundiced.  Neurological:     Mental Status: He is alert and oriented to person, place, and time.  Psychiatric:        Mood and Affect: Mood normal.        Behavior: Behavior normal.        Thought Content: Thought content normal.        Judgment: Judgment normal.      MD Evaluation Airway: WNL Heart: WNL Abdomen: WNL Chest/ Lungs: WNL ASA   Classification: 3 Mallampati/Airway Score: Two   Imaging: Ct Abdomen Pelvis Wo Contrast  Result Date: 07/18/2018 CLINICAL DATA:  Abdominal distension, nausea and vomiting. Status post aortobifemoral bypass graft with leukocytosis. Increased weakness. EXAM: CT ABDOMEN AND PELVIS WITHOUT CONTRAST TECHNIQUE: Multidetector CT imaging of the abdomen and pelvis was performed following the standard protocol without IV contrast. COMPARISON:  07/16/2018. FINDINGS: Lower chest: Again demonstrated are moderate-sized bilateral pleural effusions, without significant change. Associated bilateral compressive lower lobe atelectasis is also unchanged. Minimal pericardial effusion with a maximum thickness of 11 mm. Hepatobiliary: Stable small medial segment left lobe liver cyst. Ill-defined low density in the right lobe of the liver due to streak artifacts produced by the patient's right arm. The gallbladder remains dilated with poorly defined margins and mild pericholecystic edema. Pancreas: Unremarkable. No pancreatic ductal dilatation or surrounding inflammatory changes. Spleen: Normal in size without focal abnormality. Adrenals/Urinary Tract: Normal appearing adrenal glands. Bilateral renal cysts. Tiny mid right renal calculus. Air in the urinary bladder Stomach/Bowel: No significant change in gaseous distention of the transverse colon. The previously seen gas distended small bowel loops are normal in caliber. Decompressed stomach with a nasogastric tube in place. Mild diffuse gastric wall thickening, accentuated by the lack of distention. Vascular/Lymphatic: The previously demonstrated heterogeneous 3.2 x 1.7 cm fluid collection adjacent to the right iliac portion of the aorta by femoral graft measures 4.6 x 2.9 cm on image number 65 series 3. The previously demonstrated 10.3 x 6.0 cm larger, similar-appearing collection on the left, measures 10.0 x 7.4 cm on image number 58 series 3. This currently measures 12.9 cm in  length on coronal image number 84, previously 13.2 cm. A fluid  collection adjacent to the proximal aortic portion of the graft measures 1.8 x 1.0 cm on image number 36 series 3, previously 2.0 x 1.2 cm. Atheromatous arterial calcifications. Reproductive: Mildly enlarged prostate gland containing coarse calcifications. Other: Small right and small to moderate-sized left inguinal hernias containing fat. Small amount of free peritoneal fluid. Bilateral subcutaneous edema. A small triangular-shaped fluid collection inferior to the pancreatic body/tail junction measures 2.4 x 1.7 cm on image number 34 and 6 Hounsfield units in density, without significant change. Musculoskeletal: Extensive lumbar and lower thoracic spine degenerative changes with changes of DISH. Mild bilateral hip degenerative changes. IMPRESSION: 1. Mild increase in size of a right pelvic retroperitoneal hematoma measuring 4.6 x 2.9 cm today. 2. No significant change in a large left pelvic retroperitoneal hematoma. 3. No significant change in moderate-sized bilateral pleural effusions and associated bilateral compressive lower lobe atelectasis. 4. Stable dilated gallbladder with poorly defined margins and mild pericholecystic edema. This could be due to cholecystitis. 5. Mild diffuse gastric wall thickening, accentuated by the lack of distention. 6. Mild increase in size of a small fluid collections adjacent to the aortic graft, as described above. 7. Stable small amount of free peritoneal fluid. Electronically Signed   By: Claudie Revering M.D.   On: 07/18/2018 15:12   Ct Abdomen Pelvis Wo Contrast  Result Date: 07/16/2018 CLINICAL DATA:  Status post open repair of the abdominal aneurysm with a bifurcated aortobifemoral graft. Left retroperitoneal hematoma. Elevated white count. EXAM: CT ABDOMEN AND PELVIS WITHOUT CONTRAST TECHNIQUE: Multidetector CT imaging of the abdomen and pelvis was performed following the standard protocol without IV contrast.  COMPARISON:  07/13/2018 FINDINGS: Lower chest: Small pleural effusions again noted, minimal enlargement. Dependent bibasilar atelectasis. Normal heart size. No pericardial effusion. Hepatobiliary: Stable hypodense hepatic cyst left hepatic dome. No intrahepatic biliary dilatation. Gallbladder is slightly more distended. Increased strandy edema about the gallbladder in the right upper quadrant adjacent to the hepatic flexure of the colon. Difficult to exclude cholecystitis. Common bile duct nondilated. Pancreas: Unremarkable. No pancreatic ductal dilatation or surrounding inflammatory changes. Spleen: Normal in size without focal abnormality. Adrenals/Urinary Tract: Normal adrenal glands. No renal obstruction or hydronephrosis. Ureters are nondilated. Stable right renal cyst. Moderate bladder distention containing air as before. Stomach/Bowel: Scattered air and stool throughout the bowel. Slight increased colonic distention particularly of the transverse colon without obstruction. Appearance suggest mild colonic ileus. Vascular/Lymphatic: Patient is status post aortobifemoral bypass. Stable perigraft retroperitoneal small fluid collections about the aortic portion of the graft and along the iliac limbs. Small right iliac collection is smaller measuring 3.2 x 1.7 cm, previously 4.1 x 2.5 cm. Large left abdominopelvic retroperitoneal hematoma is grossly stable in size measuring 10 x 6 x 13 cm. Stable small circumferential fluid collections in the inguinal regions about the operative sites. No new abdominopelvic fluid collections. Reproductive: Prostate is unremarkable. Other: Intact abdominal wall. Trace fluid about the inferior liver margin. Perisplenic fluid has resolved. Musculoskeletal: Advanced degenerative changes of the spine and pelvis. No new acute osseous finding or fracture. IMPRESSION: Stable small postoperative fluid collections about the aortobifemoral bypass graft. Stable large left retroperitoneal  abdominopelvic hematoma. No new abdominopelvic fluid collections. Slight increased gallbladder distension and increased right upper quadrant edema/inflammation may represent developing mild cholecystitis. Consider correlation with right upper quadrant ultrasound. Mild increased bowel distension most pronounced of the transverse colon suggesting mild ileus without obstruction. These results were called by telephone at the time of interpretation on 07/16/2018 at 11:04 am to Dr. Durene Fruits  BRABHAM , who verbally acknowledged these results. Electronically Signed   By: Jerilynn Mages.  Shick M.D.   On: 07/16/2018 11:05   Ct Abdomen Pelvis Wo Contrast  Result Date: 07/13/2018 CLINICAL DATA:  Status post aortobifemoral bypass with left lower quadrant pain EXAM: CT ABDOMEN AND PELVIS WITHOUT CONTRAST TECHNIQUE: Multidetector CT imaging of the abdomen and pelvis was performed following the standard protocol without IV contrast. COMPARISON:  02/03/2018 FINDINGS: Lower chest: Bilateral pleural effusions are noted right greater than left with associated right lower lobe atelectasis. Hepatobiliary: Liver is well visualized and within normal limits. A small cyst is again seen similar to that noted on prior CT examination. The gallbladder demonstrates hyperdense material with changes suggestive of wall thickening and mild pericholecystic inflammatory change. A small amount of perihepatic fluid is noted. Pancreas: Unremarkable. No pancreatic ductal dilatation or surrounding inflammatory changes. Spleen: Normal in size without focal abnormality. Adrenals/Urinary Tract: Adrenal glands are within normal limits. Stable right renal cyst is seen. Renal vascular calcifications are noted. No calculi or obstructive changes are noted. Infarct in the lower pole of the left kidney is noted stable from the prior exam. Bladder is well distended. Air is noted within the bladder likely related to prior instrumentation. Stomach/Bowel: Diverticular change of the  colon is noted without evidence of diverticulitis. No obstructive or inflammatory changes are seen. The appendix is not well visualized and may have been surgically removed. No obstructive changes are noted within the small bowel. Stomach is decompressed. Vascular/Lymphatic: Aortic calcifications are identified. Changes consistent with aortobifemoral bypass graft are noted. Adjacent to the limbs of the graft bilaterally there are mildly hyperdense collections identified. On the left, this collection measures 10.0 x 6.1 by 12.9 cm in greatest transverse, AP and craniocaudad projections. Adjacent to the right limb is a smaller 4.0 by 2.5 cm collection identified. These are both consistent with postoperative hematomas. Similar postoperative collections are noted adjacent to the distal anastomotic sites. The collection surrounding the right distal anastomosis is hypodense and may represent a seroma. Slight increased density is noted in the left Perianastomotic collection likely representing hematoma. No significant lymphadenopathy is noted. Reproductive: Prostate is unremarkable. Other: Minimal free fluid is noted adjacent to the liver and spleen likely related to the known postoperative state. Musculoskeletal: Degenerative changes of lumbar spine and hip joints are seen. No acute bony abnormality is noted. IMPRESSION: Postoperative fluid collections adjacent to the aortobifemoral bypass graft bilaterally as described above. The largest of these lies along the course of the left limb with increased attenuation identified consistent with hematoma. Mild inflammatory changes surrounding the gallbladder. Ultrasound may be helpful for further evaluation. Diverticulosis without diverticulitis. These results will be called to the ordering clinician or representative by the Radiologist Assistant, and communication documented in the PACS or zVision Dashboard. Electronically Signed   By: Inez Catalina M.D.   On: 07/13/2018 11:19     Dg Abd 1 View  Result Date: 07/18/2018 CLINICAL DATA:  Enteric tube advanced into stomach under fluoroscopy. EXAM: ABDOMEN - 1 VIEW; DG NASO G TUBE PLC W/FL-NO RAD COMPARISON:  07/17/2018 abdominal radiograph FINDINGS: Fluoroscopy time 1 minutes 7 seconds. Enteric tube tip advanced to the body of the stomach. IMPRESSION: Enteric tube is now well positioned with the tip in the body of the stomach after advancement under fluoroscopy. Electronically Signed   By: Ilona Sorrel M.D.   On: 07/18/2018 09:50   Dg Abd 1 View  Result Date: 07/16/2018 CLINICAL DATA:  Abdominal distension, status post aortobifemoral  bypass EXAM: ABDOMEN - 1 VIEW COMPARISON:  07/11/2018 abdominal radiograph FINDINGS: Mild diffuse dilatation of the small and large bowel with moderate colonic stool. No evidence of pneumatosis or pneumoperitoneum. Small right pleural effusion. IMPRESSION: 1. Mild diffuse dilatation of the small and large bowel with moderate colonic stool, most compatible with adynamic ileus. 2. Small right pleural effusion. Electronically Signed   By: Ilona Sorrel M.D.   On: 07/16/2018 09:04   Dg Abd 1 View  Result Date: 07/11/2018 CLINICAL DATA:  Lower abdominal pain for 1 day. EXAM: ABDOMEN - 1 VIEW COMPARISON:  07/06/2018 FINDINGS: Moderate colonic stool burden without evidence of enteric obstruction. Nondiagnostic evaluation for pneumoperitoneum secondary to supine positioning and exclusion of the lower thorax. No pneumatosis or portal venous gas. Vascular stents overlie the pelvis bilaterally. Scattered surgical clips. No definitive abnormal intra-abdominal calcifications. Stigmata of DISH within the lower thoracic spine. Degenerative change the bilateral hips with joint space loss, subchondral sclerosis and osteophytosis. IMPRESSION: Moderate colonic stool burden without evidence of enteric obstruction. Electronically Signed   By: Sandi Mariscal M.D.   On: 07/11/2018 17:29   Nm Hepatobiliary Liver Func  Result  Date: 07/19/2018 CLINICAL DATA:  78 y/o  M; right upper quadrant pain and tenderness. EXAM: NUCLEAR MEDICINE HEPATOBILIARY IMAGING TECHNIQUE: Sequential images of the abdomen were obtained out to 60 minutes following intravenous administration of radiopharmaceutical. RADIOPHARMACEUTICALS:  5.4 mCi Tc-58m  Choletec IV COMPARISON:  None. FINDINGS: Prompt uptake and biliary excretion of activity by the liver is seen. No gallbladder activity is visualized, consistent with obstruction of cystic duct. Biliary activity passes into small bowel, consistent with patent common bile duct. IMPRESSION: No gallbladder activity consistent with obstruction of cystic duct and acute cholecystitis. These results will be called to the ordering clinician or representative by the Radiologist Assistant, and communication documented in the PACS or zVision Dashboard. Electronically Signed   By: Kristine Garbe M.D.   On: 07/19/2018 15:07   Dg Chest Port 1 View  Result Date: 07/07/2018 CLINICAL DATA:  Aortobifemoral bypass. EXAM: PORTABLE CHEST 1 VIEW COMPARISON:  07/06/2018. FINDINGS: NG tube noted with tip below left hemidiaphragm. Heart size normal. Right base atelectasis/infiltrate. Small right pleural effusion. No pneumothorax. Total replacement right shoulder. IMPRESSION: 1.  NG tube noted with tip below left hemidiaphragm. 2.  Right base atelectasis and infiltrate noted on today's exam. Electronically Signed   By: Marcello Moores  Register   On: 07/07/2018 06:23   Dg Chest Port 1 View  Result Date: 07/06/2018 CLINICAL DATA:  Postop EXAM: PORTABLE CHEST 1 VIEW COMPARISON:  CT 02/03/2018 FINDINGS: Esophageal tube tip is below the diaphragm but non included. Status post right shoulder replacement. Subsegmental atelectasis at the left base. No focal consolidation or effusion. Normal heart size. No pneumothorax. IMPRESSION: Low lung volumes with subsegmental atelectasis at the left base. Electronically Signed   By: Donavan Foil M.D.    On: 07/06/2018 17:17   Dg Abd Portable 1v  Result Date: 07/17/2018 CLINICAL DATA:  NG tube placement EXAM: PORTABLE ABDOMEN - 1 VIEW COMPARISON:  None. FINDINGS: NG tube with tip in the gastric cardia. Side port above the GE junction. Consider advancing 6 to 8 cm such that the side port is below the GE junction. Gas-filled loops of bowel. IMPRESSION: NG tube tip in stomach.  Consider advancement as above. Electronically Signed   By: Suzy Bouchard M.D.   On: 07/17/2018 22:35   Dg Abd Portable 1v  Result Date: 07/06/2018 CLINICAL DATA:  Postop EXAM:  PORTABLE ABDOMEN - 1 VIEW COMPARISON:  None. FINDINGS: Esophageal tube tip projects over gastric outlet region. Visible gas pattern is nonobstructed. Bilateral iliac stents. No radiopaque calculi. IMPRESSION: 1. Esophageal tube tip overlies the gastric outlet 2. Nonobstructed bowel-gas pattern. Electronically Signed   By: Donavan Foil M.D.   On: 07/06/2018 17:20   Dg [place]ison Bailey G Tube Plc W/fl-no Rad  Result Date: 07/18/2018 CLINICAL DATA:  Enteric tube advanced into stomach under fluoroscopy. EXAM: ABDOMEN - 1 VIEW; DG NASO G TUBE PLC W/FL-NO RAD COMPARISON:  07/17/2018 abdominal radiograph FINDINGS: Fluoroscopy time 1 minutes 7 seconds. Enteric tube tip advanced to the body of the stomach. IMPRESSION: Enteric tube is now well positioned with the tip in the body of the stomach after advancement under fluoroscopy. Electronically Signed   By: Ilona Sorrel M.D.   On: 07/18/2018 09:50   [place] Ekg Site Rite  Result Date: 07/16/2018 If Site Rite image not attached, placement could not be confirmed due to current cardiac rhythm.  US Abdomen Limited Ruq  Result Date: 07/19/2018 CLINICAL DATA:  Abdominal pain. EXAM: ULTRASOUND ABDOMEN LIMITED RIGHT UPPER QUADRANT COMPARISON:  CT 07/18/2018. FINDINGS: Gallbladder: Sludge in the gallbladder. Gallbladder wall thickness 4.2 mm. Pericholecystic fluid noted. Negative Murphy sign. Common bile duct: Diameter: 5.8 mm Liver:  1.2 cm thinly septated cyst left hepatic lobe, most likely benign. Similar findings on prior exam. Within normal limits in parenchymal echogenicity. Portal vein is patent on color Doppler imaging with normal direction of blood flow towards the liver. Incidental note is made of right pleural effusion. IMPRESSION: 1. Large amount of sludge noted. Gallbladder wall is thickened at 4.2 mm. Pericholecystic fluid noted. Cholecystitis can not be excluded. 2. Incidental note is made of right pleural effusion. Electronically Signed   By: [place]   On: 07/19/2018 10:52   US Abdomen Limited Ruq  Result Date: 07/16/2018 CLINICAL DATA:  Followup current CT. Gallbladder appears distended and possibly inflamed. EXAM: ULTRASOUND ABDOMEN LIMITED RIGHT UPPER QUADRANT COMPARISON:  CT, 07/16/2018. FINDINGS: Gallbladder: Gallbladder is distended. Fundus not well seen due to overlying bowel gas. There are dependent echoes suggesting sludge, but no shadowing stones. Wall measures 3 mm in thickness. No pericholecystic fluid. No sonographic Murphy's sign. Common bile duct: Diameter: 2 mm Liver: Somewhat coarsened echotexture. Portions of the left lobe are obscured by midline bowel gas. Liver normal in overall size. No mass or focal lesion. Note is made of a 4 cm right renal cyst and right pleural effusion. IMPRESSION: 1. Limited exam due to the patient's body habitus and limited acoustic window in the right upper quadrant as well as midline bowel gas. 2. Allowing for this, the gallbladder is distended. Wall is top-normal in thickness. There is no pericholecystic fluid and no shadowing stones. There is possible dependent sludge. No sonographic Murphy's sign. Overall findings argue against acute cholecystitis. 3. Small right pleural effusion.  Right renal cyst. Electronically Signed   By: Lajean Manes M.D.   On: 07/16/2018 13:40    Labs:  CBC: Recent Labs    07/17/18 1144 07/17/18 1714 07/18/18 0437 07/19/18 0522  WBC  41.1* 34.7* 27.0* 22.1*  HGB 8.5* 8.1* 7.5* 8.2*  HCT 27.3* 24.7* 24.9* 25.0*  PLT 491* 483* 478* 567*    COAGS: Recent Labs    09/06/17 0923 07/01/18 0926 07/06/18 1600  INR 1.04 1.04 1.25  APTT 31 30 27     BMP: Recent Labs    07/16/18 0353 07/17/18 0859 07/18/18 0437 07/19/18 0432  NA 131* 126* 135 134*  K 4.7 4.7 4.1 3.5  CL 99 92* 101 103  CO2 22 24 24 23   GLUCOSE 332* 468* 135* 215*  BUN 22 17 30* 31*  CALCIUM 8.0* 8.0* 7.8* 8.0*  CREATININE 1.76* 1.38* 1.61* 1.55*  GFRNONAA 36* 49* 41* 43*  GFRAA 42* 57* 47* 49*    LIVER FUNCTION TESTS: Recent Labs    07/01/18 0926 07/07/18 0711 07/17/18 0859 07/18/18 0437  BILITOT 0.2* 0.8 0.7 1.0  AST 20 23 19 31   ALT 13 12 11 16   ALKPHOS 70 46 79 86  PROT 6.9 5.5* 5.2* 5.3*  ALBUMIN 3.8 3.1* 2.1* 2.0*    TUMOR MARKERS: No results for input(s): AFPTM, CEA, CA199, CHROMGRNA in the last 8760 hours.  Assessment and Plan:  Patient s/p aortobifemoral bypass grafting 07/06/18 with [name] with worsening RUQ pain, abdominal distention and leukocytosis. RUQ abdominal US shows large amount of sludge with gallbladder thickening at 4.2 mm with pericholecystic fluid. Follow up HIDA shows no gallbladder activity consistent with obstruction of the cystic duct and acute cholecystitis. Request for percutaneous cholecystostomy to be performed tonight given these findings.  Patient has been NPO since 1/18, receiving TPN via IV, he does take Plavix which cannot be held due to recent cardiac surgery. Afebrile, tachycardic, hypertensive, tachypneic in IR although appears comfortable on exam.  WBC 22.1, hgb 8.2, plt 567, no INR per chart. Currently on Zosyn for leukocytosis.  Risks and benefits discussed with the patient including, but not limited to bleeding, infection, gallbladder perforation, bile leak, sepsis or even death.  All of the patient's questions were answered, patient is agreeable to proceed.  Consent signed and in  chart.  Thank you for this interesting consult.  I greatly enjoyed meeting Carmelo Reidel and look forward to participating in their care.  A copy of this report was sent to the requesting provider on this date.  Electronically Signed: Joaquim Nam, PA-C 07/19/2018, 4:52 PM   I spent a total of 40 Minutes  in face to face in clinical consultation, greater than 50% of which was counseling/coordinating care for percutaneous cholecystostomy placement.

## 2018-07-19 NOTE — Plan of Care (Signed)
  Problem: Clinical Measurements: Goal: Respiratory complications will improve Outcome: Progressing   Problem: Activity: Goal: Risk for activity intolerance will decrease Outcome: Progressing   Problem: Clinical Measurements: Goal: Ability to maintain clinical measurements within normal limits will improve Outcome: Not Progressing Goal: Will remain free from infection Outcome: Not Progressing   Problem: Nutrition: Goal: Adequate nutrition will be maintained Outcome: Not Progressing   Problem: Elimination: Goal: Will not experience complications related to bowel motility Outcome: Not Progressing

## 2018-07-19 NOTE — Progress Notes (Signed)
Approximately 2345, patient's heart rate was between 130-150's. At one point, HR went to 165-174. 5 mg lopressor given at 2358. Patient's HR went down to 110-120.   Patient's B/P elevated to 167'O systolic. PRN hydralazine given at 0206. Patient's HR continues to 130-150's. Will continue to monitor

## 2018-07-19 NOTE — Progress Notes (Signed)
RUQ u/s today is now positive for cholecystitis.  I consulted general surgery.  A HIDA was performed which confirmed the acute cholecystitis diagnosis.  I spoke with Dr. Vernard Gambles and he will place a chole tube today.  Wife and son updated.  Appreciate care and assistance from general surgery and IR   Christian Bailey

## 2018-07-19 NOTE — Progress Notes (Addendum)
Request to IR for percutaneous cholecystostomy placement - requesting to be placed today. Patient has been on continuous TPN per RN. Unable to sedate patient given TPN via NG.   Will hold tube feeds starting now. Will tentatively plan to place cholecystostomy tomorrow.  Please call IR on call MD for questions or concerns overnight.  IR PA will see patient for consent in AM.  Christian Norse, PA-C  ADDENDUM @ [phone] -- patient receiving TPN via IV not NG. Plan for procedure today in IR. Please see full consult note for further details.

## 2018-07-19 NOTE — Procedures (Signed)
  Procedure: Percutaneous cholecystostomy catheter placement 10f EBL:   minimal Complications:  none immediate  See full dictation in [facility] PACS.  D. Diantha Paxson MD Main # [phone] Pager  [phone]    

## 2018-07-19 NOTE — Consult Note (Signed)
Christian Bailey 04/14/1941  [id number].    Requesting MD: Dr. Serafina Mitchell Chief Complaint/Reason for Consult: RUQ abdominal pain  HPI:  This is a very pleasant 78 yo white male with a history of PVD, s/p aortobifem bypass by [name] about 2 weeks ago.  He has a history of RBBB/sinsu tachycardia, HTN, MD, who was admitted and underwent the aforementioned procedure.  He has had postoperative pain that waxes and wanes in his RUQ since right after surgery he states, but it has progressively worsened.  He was doing ok for the first week or so, until last Thursday when he got abdominal distention and worsening pain.  He denies any nausea or vomiting.  He is passing some flatus, but no significant BMs.  He initially had a CT scan and [place] that was unremarkable for ischemic bowel or any other definitive findings to explain his pain.  However, after that his WBC began to increase and maxed out at 41K 2 days ago. He is on Zosyn and Flagyl and his wBC is trending down to 22K today.  He underwent another abdominal US today that revealed sludge, but no stones.  There was some pericholecystic fluid noted and some mild wall thickening.  Cholecystitis could not be ruled out.  He did have an NGT placed 2 days ago secondary to abdominal distention.  His films and CT scans did show colonic dilatation, c/w possible ileus.  Due to persistent abdominal pain, we have been asked to see the patient for further evaluation and recommendations.  ROS: ROS : Please see HPI, otherwise all other systems are currently negative except for expected post operative discomfort from his recent surgery.  Does admit to sinus tachycardia at home.  Family History  Problem Relation Age of Onset  . Heart disease Mother   . Leukemia Mother   . Stomach cancer Father   . Esophageal cancer Brother   . Liver disease Brother   . Alcoholism Brother   . Leukemia Brother   . Leukemia Brother   . Diabetes type II Brother   . Lung  disease Sister     Past Medical History:  Diagnosis Date  . Anemia   . Complication of anesthesia   . Diabetes mellitus ([place])    TYPE 2  . GERD (gastroesophageal reflux disease)   . GI bleed   . History of blood transfusion    GI bleed  . History of colon polyps   . History of hiatal hernia   . Hyperlipemia   . Hypertension   . Osteoarthritis   . PAD (peripheral artery disease) ([facility])    a. stenting of his left common iliac artery >20 years ago. b. h/o LEIA stent and 2 stents to R SFA in 2011. c. 04/2014:  s/p PTA of right SFA for in-stent restenosis, occluded left SFA  . PONV (postoperative nausea and vomiting)    no porblem with the last 3 surgeries  . RBBB (right bundle branch block with left anterior fascicular block)    NUCLEAR STRESS TEST, 08/18/2010 - no significant wall motion abnoramlities noted, post-stress EF 69%, normal myocardial perfusion study  . Sinus tachycardia    a. Noted during admission 04/2014 but upon review seems to be frequent finding for patient.  . Stenosis of carotid artery    a. 50% right carotid stenosis by angiogram 04/2014.    Past Surgical History:  Procedure Laterality Date  . ABDOMINAL AORTOGRAM W/LOWER EXTREMITY N/A 01/27/2017  Procedure: Abdominal Aortogram w/Lower Extremity;  Surgeon: Serafina Mitchell, MD;  Location: [facility];  Service: Cardiovascular;  Laterality: N/A;  . ABDOMINAL AORTOGRAM W/LOWER EXTREMITY N/A 06/08/2017   Procedure: ABDOMINAL AORTOGRAM W/LOWER EXTREMITY;  Surgeon: Serafina Mitchell, MD;  Location: [facility];  Service: Cardiovascular;  Laterality: N/A;  . ABDOMINAL AORTOGRAM W/LOWER EXTREMITY N/A 08/31/2017   Procedure: ABDOMINAL AORTOGRAM W/LOWER EXTREMITY;  Surgeon: Serafina Mitchell, MD;  Location: [facility];  Service: Cardiovascular;  Laterality: N/A;  rt. unilateral  . ANGIOPLASTY / STENTING FEMORAL    . ANGIOPLASTY / STENTING ILIAC    . AORTA - BILATERAL FEMORAL ARTERY BYPASS GRAFT N/A  07/06/2018   Procedure: AORTA BIFEMORAL BYPASS GRAFT USING 14X7MM X 40CM HEMASHIELD GOLD GRAFT;  Surgeon: Serafina Mitchell, MD;  Location: [facility];  Service: Vascular;  Laterality: N/A;  . CEREBRAL ANGIOGRAM N/A 05/14/2014   Procedure: CEREBRAL ANGIOGRAM;  Surgeon: Lorretta Harp, MD;  Location: [facility] CATH LAB;  Service: Cardiovascular;  Laterality: N/A;  . COLONOSCOPY W/ POLYPECTOMY    . ENDARTERECTOMY Right 09/08/2017  . ENDARTERECTOMY FEMORAL Right 09/08/2017   Procedure: REDO RIGHT FEMORAL ENDARTECTOMY WITH PATCH ANGIOPLASTY.;  Surgeon: Serafina Mitchell, MD;  Location: [facility];  Service: Vascular;  Laterality: Right;  . EYE SURGERY Bilateral    cataract  . FEMORAL ARTERY STENT Right 05/12/2010   Stented distally with a 6x100 Abbott absolute stent and proximally with a 6x60 Cook Zilver stent resulting in the reduction of the proximal segment 80% and mid segment 60-70% to 0% residual, LEFT common femoral artery stented with a 7x3 Smart stent resulting in reduction of 90% stenosis to 0% residual  . FEMORAL-POPLITEAL BYPASS GRAFT Right 09/18/2016   Procedure: BYPASS GRAFT FEMORAL-POPLITEAL ARTERY;  Surgeon: Serafina Mitchell, MD;  Location: [facility];  Service: Vascular;  Laterality: Right;  . FEMORAL-POPLITEAL BYPASS GRAFT Right 09/08/2017   Procedure: REDO BYPASS GRAFT FEMORAL-POPLITEAL ARTERY;  Surgeon: Serafina Mitchell, MD;  Location: [facility];  Service: Vascular;  Laterality: Right;  . FEMORAL-POPLITEAL BYPASS GRAFT Right 07/06/2018   Procedure: REVISION RIGHT FEMORAL TO POPLITEAL ARTERY BYPASS GRAFT;  Surgeon: Serafina Mitchell, MD;  Location: [facility];  Service: Vascular;  Laterality: Right;  . KNEE ARTHROSCOPY     left  . LOWER EXTREMITY ANGIOGRAM N/A 05/14/2014   Procedure: LOWER EXTREMITY ANGIOGRAM;  Surgeon: Lorretta Harp, MD;  Location: [facility] C[facility] LAB;  Service: Cardiovascular;  Laterality: N/A;  . LOWER EXTREMITY ANGIOGRAPHY N/A 09/21/2016   Procedure: Lower Extremity Angiography;  Surgeon: Conrad Castalia,  MD;  Location: [facility];  Service: Cardiovascular;  Laterality: N/A;  . PERIPHERAL VASCULAR ATHERECTOMY Right 01/27/2017   Procedure: PERIPHERAL VASCULAR ATHERECTOMY;  Surgeon: Serafina Mitchell, MD;  Location: [facility];  Service: Cardiovascular;  Laterality: Right;  . PERIPHERAL VASCULAR BALLOON ANGIOPLASTY Right 06/08/2017   Procedure: PERIPHERAL VASCULAR BALLOON ANGIOPLASTY;  Surgeon: Serafina Mitchell, MD;  Location: [facility];  Service: Cardiovascular;  Laterality: Right;  common femoral and superficial femoral arteries  . PERIPHERAL VASCULAR CATHETERIZATION N/A 04/20/2016   Procedure: Lower Extremity Intervention;  Surgeon: Lorretta Harp, MD;  Location: [facility];  Service: Cardiovascular;  Laterality: N/A;  . PERIPHERAL VASCULAR INTERVENTION  08/31/2017   Procedure: PERIPHERAL VASCULAR INTERVENTION;  Surgeon: Serafina Mitchell, MD;  Location: [facility];  Service: Cardiovascular;;  REIA  . REVERSE SHOULDER ARTHROPLASTY Right 12/07/2016  . REVERSE SHOULDER  ARTHROPLASTY Right 12/07/2016   Procedure: REVERSE SHOULDER ARTHROPLASTY;  Surgeon: Netta Cedars, MD;  Location: [facility];  Service: Orthopedics;  Laterality: Right;  . ROTATOR CUFF REPAIR Right 2003  . SFA Right 05/14/2014   PTA  OF RT SFA         DR BERRY    Social History:  reports that he quit smoking about 30 years ago. He quit after 25.00 years of use. He has never used smokeless tobacco. He reports current alcohol use. He reports that he does not use drugs.  Allergies:  Allergies  Allergen Reactions  . Asa [Aspirin] Other (See Comments)    GI bleeding  . Gadolinium Derivatives Other (See Comments)    Pt complained of face flushing/hottness and throat tightness/scratchiness immediately after the injections.  Within 4 minutes, all symptoms were gone and the study was completed.  No further complications or signs of allergy were exhibited after completion of study.   .  Oxycodone-Acetaminophen Other (See Comments)    Dizziness, uncomfortable     Facility-Administered Medications Prior to Admission  Medication Dose Route Frequency Provider Last Rate Last Dose  . 0.9 %  sodium chloride infusion  500 mL Intravenous Once Nelida Meuse III, MD      . 0.9 %  sodium chloride infusion  500 mL Intravenous Once Doran Stabler, MD       Medications Prior to Admission  Medication Sig Dispense Refill  . amoxicillin (AMOXIL) 500 MG capsule Take 2 grams (4 tablets) by mouth 1 hour prior to procedure. (Patient taking differently: Take 2,000 mg by mouth See admin instructions. Take 2000 mg by mouth 1 hour prior to dental procedure) 4 capsule 2  . clopidogrel (PLAVIX) 75 MG tablet Take 1 tablet (75 mg total) by mouth daily. 90 tablet 0  . Coenzyme Q10 (COQ10 PO) Take 1 capsule by mouth every evening.     [name] glucosamine-chondroitin 500-400 MG tablet Take 1 tablet by mouth every evening.     [name] lisinopril (PRINIVIL,ZESTRIL) 5 MG tablet Take 5 mg by mouth 2 (two) times daily.     . metFORMIN (GLUCOPHAGE) 1000 MG tablet Take 1 tablet (1,000 mg total) by mouth 2 (two) times daily with a meal.    . metoprolol succinate (TOPROL-XL) 25 MG 24 hr tablet Take 25 mg by mouth 2 (two) times daily.     . Omega-3 Fatty Acids (FISH OIL) 1200 MG CAPS Take 1,200 mg by mouth every evening.     . pantoprazole (PROTONIX) 40 MG tablet Take 40 mg by mouth twice daily (Patient taking differently: Take 40 mg by mouth 2 (two) times daily. ) 180 tablet 0  . rosuvastatin (CRESTOR) 20 MG tablet Take 20 mg by mouth every evening.        Physical Exam: Blood pressure 136/77, pulse (!) 118, temperature 97.6 F (36.4 C), resp. rate (!) 25, height 5\' 5"  (1.651 m), weight 70.2 kg, SpO2 97 %. General: pleasant, WD white male who is laying in bed in NAD HEENT: head is normocephalic, atraumatic.  Sclera are noninjected.  PERRL.  Ears and nose without any masses or lesions.  NGT in place in right nare.  No  significant NGT output.  Mouth is pink and dry. Heart: tachy, irregular.  Normal s1,s2. No obvious murmurs, gallops, or rubs noted.  Palpable radial and pedal pulses bilaterally Lungs: CTAB, no wheezes, rhonchi, or rales noted.  Respiratory effort minimally labored secondary to abdominal distention Abd: soft, focally tender  in RUQ, + Murphy's sign, distention with tympany, hypoactive BS, no masses, hernias, or organomegaly.  Midline wound is c/d/i.  R groin with VAC in place.  L groin with well-healed incision.  Small knot noted on top of wound MS: all 4 extremities are symmetrical with no cyanosis, clubbing, or edema. Skin: warm and dry with no masses, lesions, or rashes Psych: A&Ox3 with an appropriate affect.   Results for orders placed or performed during the hospital encounter of 07/06/18 (from the past 48 hour(s))  Glucose, capillary     Status: Abnormal   Collection Time: 07/17/18  4:34 PM  Result Value Ref Range   Glucose-Capillary 139 (H) 70 - 99 mg/dL   Comment 1 Notify RN    Comment 2 Document in Chart   CBC     Status: Abnormal   Collection Time: 07/17/18  5:14 PM  Result Value Ref Range   WBC 34.7 (H) 4.0 - 10.5 K/uL   RBC 2.68 (L) 4.22 - 5.81 MIL/uL   Hemoglobin 8.1 (L) 13.0 - 17.0 g/dL   HCT 24.7 (L) 39.0 - 52.0 %   MCV 92.2 80.0 - 100.0 fL   MCH 30.2 26.0 - 34.0 pg   MCHC 32.8 30.0 - 36.0 g/dL   RDW 14.8 11.5 - 15.5 %   Platelets 483 (H) 150 - 400 K/uL   nRBC 0.0 0.0 - 0.2 %    Comment: Performed at [facility], [address], [place], [place] [address]  Glucose, capillary     Status: Abnormal   Collection Time: 07/17/18  8:20 PM  Result Value Ref Range   Glucose-Capillary 221 (H) 70 - 99 mg/dL  Glucose, capillary     Status: Abnormal   Collection Time: 07/18/18 12:09 AM  Result Value Ref Range   Glucose-Capillary 216 (H) 70 - 99 mg/dL  Comprehensive metabolic panel     Status: Abnormal   Collection Time: 07/18/18  4:37 AM  Result Value Ref Range    Sodium 135 135 - 145 mmol/L   Potassium 4.1 3.5 - 5.1 mmol/L   Chloride 101 98 - 111 mmol/L   CO2 24 22 - 32 mmol/L   Glucose, Bld 135 (H) 70 - 99 mg/dL   BUN 30 (H) 8 - 23 mg/dL   Creatinine, Ser 1.61 (H) 0.61 - 1.24 mg/dL   Calcium 7.8 (L) 8.9 - 10.3 mg/dL   Total Protein 5.3 (L) 6.5 - 8.1 g/dL   Albumin 2.0 (L) 3.5 - 5.0 g/dL   AST 31 15 - 41 U/L   ALT 16 0 - 44 U/L   Alkaline Phosphatase 86 38 - 126 U/L   Total Bilirubin 1.0 0.3 - 1.2 mg/dL   GFR calc non Af Amer 41 (L) >60 mL/min   GFR calc Af Amer 47 (L) >60 mL/min   Anion gap 10 5 - 15    Comment: Performed at [facility], [place] [address] [place], [place] [address]  CBC     Status: Abnormal   Collection Time: 07/18/18  4:37 AM  Result Value Ref Range   WBC 27.0 (H) 4.0 - 10.5 K/uL   RBC 2.71 (L) 4.22 - 5.81 MIL/uL   Hemoglobin 7.5 (L) 13.0 - 17.0 g/dL   HCT 24.9 (L) 39.0 - 52.0 %   MCV 91.9 80.0 - 100.0 fL   MCH 27.7 26.0 - 34.0 pg   MCHC 30.1 30.0 - 36.0 g/dL   RDW 14.6 11.5 - 15.5 %  Platelets 478 (H) 150 - 400 K/uL   nRBC 0.0 0.0 - 0.2 %    Comment: Performed at [facility], [place] [address] [place], [place] [address]  Magnesium     Status: None   Collection Time: 07/18/18  4:37 AM  Result Value Ref Range   Magnesium 1.9 1.7 - 2.4 mg/dL    Comment: Performed at [facility] [address] [place], [place] [address]  Phosphorus     Status: None   Collection Time: 07/18/18  4:37 AM  Result Value Ref Range   Phosphorus 3.6 2.5 - 4.6 mg/dL    Comment: Performed at [facility] [address] [place], [place] [address]  Differential     Status: Abnormal   Collection Time: 07/18/18  4:37 AM  Result Value Ref Range   Neutrophils Relative % 89 %   Neutro Abs 23.9 (H) 1.7 - 7.7 K/uL   Lymphocytes Relative 2 %   Lymphs Abs 0.7 0.7 - 4.0 K/uL   Monocytes Relative 7 %   Monocytes Absolute 1.9 (H) 0.1 - 1.0 K/uL   Eosinophils Relative 0 %   Eosinophils Absolute 0.0 0.0 - 0.5 K/uL    Basophils Relative 0 %   Basophils Absolute 0.0 0.0 - 0.1 K/uL   Immature Granulocytes 2 %   Abs Immature Granulocytes 0.42 (H) 0.00 - 0.07 K/uL   Burr Cells PRESENT    Polychromasia PRESENT     Comment: Performed at [facility], [place] [address], [place], [place] [address]  Prealbumin     Status: Abnormal   Collection Time: 07/18/18  4:37 AM  Result Value Ref Range   Prealbumin <5 (L) 18 - 38 mg/dL    Comment: Performed at [facility] [address], [place], [place] [address]  Triglycerides     Status: None   Collection Time: 07/18/18  4:40 AM  Result Value Ref Range   Triglycerides 51 <150 mg/dL    Comment: Performed at [facility] [address], [place], [place] [address]  Glucose, capillary     Status: Abnormal   Collection Time: 07/18/18  5:00 AM  Result Value Ref Range   Glucose-Capillary 144 (H) 70 - 99 mg/dL  Glucose, capillary     Status: Abnormal   Collection Time: 07/18/18 10:21 AM  Result Value Ref Range   Glucose-Capillary 236 (H) 70 - 99 mg/dL  Glucose, capillary     Status: Abnormal   Collection Time: 07/18/18 11:54 AM  Result Value Ref Range   Glucose-Capillary 210 (H) 70 - 99 mg/dL  Glucose, capillary     Status: Abnormal   Collection Time: 07/18/18  4:09 PM  Result Value Ref Range   Glucose-Capillary 180 (H) 70 - 99 mg/dL  Glucose, capillary     Status: Abnormal   Collection Time: 07/18/18  8:00 PM  Result Value Ref Range   Glucose-Capillary 177 (H) 70 - 99 mg/dL  Glucose, capillary     Status: Abnormal   Collection Time: 07/19/18 12:23 AM  Result Value Ref Range   Glucose-Capillary 215 (H) 70 - 99 mg/dL  Glucose, capillary     Status: Abnormal   Collection Time: 07/19/18  3:41 AM  Result Value Ref Range   Glucose-Capillary 187 (H) 70 - 99 mg/dL  Basic metabolic panel     Status: Abnormal   Collection Time: 07/19/18  4:32 AM  Result Value Ref Range   Sodium  134 (L) 135 - 145 mmol/L   Potassium 3.5 3.5 - 5.1 mmol/L   Chloride  103 98 - 111 mmol/L   CO2 23 22 - 32 mmol/L   Glucose, Bld 215 (H) 70 - 99 mg/dL   BUN 31 (H) 8 - 23 mg/dL   Creatinine, Ser 1.55 (H) 0.61 - 1.24 mg/dL   Calcium 8.0 (L) 8.9 - 10.3 mg/dL   GFR calc non Af Amer 43 (L) >60 mL/min   GFR calc Af Amer 49 (L) >60 mL/min   Anion gap 8 5 - 15    Comment: Performed at [facility] [address] [place], [place] [address]  Magnesium     Status: None   Collection Time: 07/19/18  4:32 AM  Result Value Ref Range   Magnesium 2.3 1.7 - 2.4 mg/dL    Comment: Performed at [facility] [address], [place], [place] [address]  Phosphorus     Status: Abnormal   Collection Time: 07/19/18  4:32 AM  Result Value Ref Range   Phosphorus 2.4 (L) 2.5 - 4.6 mg/dL    Comment: Performed at [facility] [address], [place], [place] [address]  Troponin I - Once     Status: Abnormal   Collection Time: 07/19/18  4:32 AM  Result Value Ref Range   Troponin I 0.03 (HH) <0.03 ng/mL    Comment: CRITICAL RESULT CALLED TO, READ BACK BY AND VERIFIED WITH: DAVIDSON C,RN 07/19/18 46[address] [facility] Performed at [facility] [address], [place], [place] [address]   CBC     Status: Abnormal   Collection Time: 07/19/18  5:22 AM  Result Value Ref Range   WBC 22.1 (H) 4.0 - 10.5 K/uL   RBC 2.70 (L) 4.22 - 5.81 MIL/uL   Hemoglobin 8.2 (L) 13.0 - 17.0 g/dL   HCT 25.0 (L) 39.0 - 52.0 %   MCV 92.6 80.0 - 100.0 fL   MCH 30.4 26.0 - 34.0 pg   MCHC 32.8 30.0 - 36.0 g/dL   RDW 15.1 11.5 - 15.5 %   Platelets 567 (H) 150 - 400 K/uL   nRBC 0.0 0.0 - 0.2 %    Comment: Performed at [facility], [place] [address], [place], [place] [address]  Glucose, capillary     Status: Abnormal   Collection Time: 07/19/18  8:09 AM  Result Value Ref Range   Glucose-Capillary 227 (H) 70 - 99 mg/dL   Comment 1 Notify RN    Ct Abdomen Pelvis Wo Contrast  Result Date: 07/18/2018 CLINICAL DATA:  Abdominal distension, nausea and vomiting. Status post  aortobifemoral bypass graft with leukocytosis. Increased weakness. EXAM: CT ABDOMEN AND PELVIS WITHOUT CONTRAST TECHNIQUE: Multidetector CT imaging of the abdomen and pelvis was performed following the standard protocol without IV contrast. COMPARISON:  07/16/2018. FINDINGS: Lower chest: Again demonstrated are moderate-sized bilateral pleural effusions, without significant change. Associated bilateral compressive lower lobe atelectasis is also unchanged. Minimal pericardial effusion with a maximum thickness of 11 mm. Hepatobiliary: Stable small medial segment left lobe liver cyst. Ill-defined low density in the right lobe of the liver due to streak artifacts produced by the patient's right arm. The gallbladder remains dilated with poorly defined margins and mild pericholecystic edema. Pancreas: Unremarkable. No pancreatic ductal dilatation or surrounding inflammatory changes. Spleen: Normal in size without focal abnormality. Adrenals/Urinary Tract: Normal appearing adrenal glands. Bilateral renal cysts. Tiny mid right renal calculus. Air in the urinary bladder  Stomach/Bowel: No significant change in gaseous distention of the transverse colon. The previously seen gas distended small bowel loops are normal in caliber. Decompressed stomach with a nasogastric tube in place. Mild diffuse gastric wall thickening, accentuated by the lack of distention. Vascular/Lymphatic: The previously demonstrated heterogeneous 3.2 x 1.7 cm fluid collection adjacent to the right iliac portion of the aorta by femoral graft measures 4.6 x 2.9 cm on image number 65 series 3. The previously demonstrated 10.3 x 6.0 cm larger, similar-appearing collection on the left, measures 10.0 x 7.4 cm on image number 58 series 3. This currently measures 12.9 cm in length on coronal image number 84, previously 13.2 cm. A fluid collection adjacent to the proximal aortic portion of the graft measures 1.8 x 1.0 cm on image number 36 series 3, previously 2.0  x 1.2 cm. Atheromatous arterial calcifications. Reproductive: Mildly enlarged prostate gland containing coarse calcifications. Other: Small right and small to moderate-sized left inguinal hernias containing fat. Small amount of free peritoneal fluid. Bilateral subcutaneous edema. A small triangular-shaped fluid collection inferior to the pancreatic body/tail junction measures 2.4 x 1.7 cm on image number 34 and 6 Hounsfield units in density, without significant change. Musculoskeletal: Extensive lumbar and lower thoracic spine degenerative changes with changes of DISH. Mild bilateral hip degenerative changes. IMPRESSION: 1. Mild increase in size of a right pelvic retroperitoneal hematoma measuring 4.6 x 2.9 cm today. 2. No significant change in a large left pelvic retroperitoneal hematoma. 3. No significant change in moderate-sized bilateral pleural effusions and associated bilateral compressive lower lobe atelectasis. 4. Stable dilated gallbladder with poorly defined margins and mild pericholecystic edema. This could be due to cholecystitis. 5. Mild diffuse gastric wall thickening, accentuated by the lack of distention. 6. Mild increase in size of a small fluid collections adjacent to the aortic graft, as described above. 7. Stable small amount of free peritoneal fluid. Electronically Signed   By: Claudie Revering M.D.   On: 07/18/2018 15:12   Dg Abd 1 View  Result Date: 07/18/2018 CLINICAL DATA:  Enteric tube advanced into stomach under fluoroscopy. EXAM: ABDOMEN - 1 VIEW; DG NASO G TUBE PLC W/FL-NO RAD COMPARISON:  07/17/2018 abdominal radiograph FINDINGS: Fluoroscopy time 1 minutes 7 seconds. Enteric tube tip advanced to the body of the stomach. IMPRESSION: Enteric tube is now well positioned with the tip in the body of the stomach after advancement under fluoroscopy. Electronically Signed   By: Ilona Sorrel M.D.   On: 07/18/2018 09:50   Dg Abd Portable 1v  Result Date: 07/17/2018 CLINICAL DATA:  NG tube  placement EXAM: PORTABLE ABDOMEN - 1 VIEW COMPARISON:  None. FINDINGS: NG tube with tip in the gastric cardia. Side port above the GE junction. Consider advancing 6 to 8 cm such that the side port is below the GE junction. Gas-filled loops of bowel. IMPRESSION: NG tube tip in stomach.  Consider advancement as above. Electronically Signed   By: Suzy Bouchard M.D.   On: 07/17/2018 22:35   Dg Addison Bailey G Tube Plc W/fl-no Rad  Result Date: 07/18/2018 CLINICAL DATA:  Enteric tube advanced into stomach under fluoroscopy. EXAM: ABDOMEN - 1 VIEW; DG NASO G TUBE PLC W/FL-NO RAD COMPARISON:  07/17/2018 abdominal radiograph FINDINGS: Fluoroscopy time 1 minutes 7 seconds. Enteric tube tip advanced to the body of the stomach. IMPRESSION: Enteric tube is now well positioned with the tip in the body of the stomach after advancement under fluoroscopy. Electronically Signed   By: Janina Mayo.D.  On: 07/18/2018 09:50   US Abdomen Limited Ruq  Result Date: 07/19/2018 CLINICAL DATA:  Abdominal pain. EXAM: ULTRASOUND ABDOMEN LIMITED RIGHT UPPER QUADRANT COMPARISON:  CT 07/18/2018. FINDINGS: Gallbladder: Sludge in the gallbladder. Gallbladder wall thickness 4.2 mm. Pericholecystic fluid noted. Negative Murphy sign. Common bile duct: Diameter: 5.8 mm Liver: 1.2 cm thinly septated cyst left hepatic lobe, most likely benign. Similar findings on prior exam. Within normal limits in parenchymal echogenicity. Portal vein is patent on color Doppler imaging with normal direction of blood flow towards the liver. Incidental note is made of right pleural effusion. IMPRESSION: 1. Large amount of sludge noted. Gallbladder wall is thickened at 4.2 mm. Pericholecystic fluid noted. Cholecystitis can not be excluded. 2. Incidental note is made of right pleural effusion. Electronically Signed   By: Marcello Moores  Register   On: 07/19/2018 10:52      Assessment/Plan RUQ abdominal pain The patient appears to possibly have acalculous cholecystitis  based on his physical exam as well as his [place].  It is not conclusive and given his recent vascular surgery, we will obtain a HIDA scan to confirm this.  If his HIDA is positive then we will likely place a perc chole drain given he is about 2 weeks from his recent laparotomy.  If his HIDA is negative, then we will need to keep an eye on him to rule out the need for laparotomy; however, suspicion for ischemic bowel is somewhat low at this point as it is almost 2 weeks from his surgery and his CT scans do not show evidence of pneumatosis, portal venous gas, or other changes c/w ischemia.  Colonic ileus Suspect this is reactive to recent surgery as well as presume intra-abdominal infection.  Hopefully this will resolve with treatment of his intra-abdominal process and time.  Keep eye on electrolytes and keep K normalized.  Mobilization as much as possible.  He really isn't taking many narcotics.  He has some tramadol ordered.  NGT in place, but with minimal output.  However, by report is he significantly softer than before.  FEN - NPO/NGT/IVFs VTE - Plavix/heparin ID - Zosyn/Flagyl  Henreitta Cea, [facility] 07/19/2018, 12:32 PM Pager: [phone]

## 2018-07-20 ENCOUNTER — Inpatient Hospital Stay (HOSPITAL_COMMUNITY): Payer: Medicare Other

## 2018-07-20 ENCOUNTER — Encounter (HOSPITAL_COMMUNITY): Payer: Self-pay | Admitting: Interventional Radiology

## 2018-07-20 LAB — BASIC METABOLIC PANEL
Anion gap: 7 (ref 5–15)
BUN: 31 mg/dL — ABNORMAL HIGH (ref 8–23)
CALCIUM: 7.9 mg/dL — AB (ref 8.9–10.3)
CO2: 22 mmol/L (ref 22–32)
Chloride: 109 mmol/L (ref 98–111)
Creatinine, Ser: 1.47 mg/dL — ABNORMAL HIGH (ref 0.61–1.24)
GFR calc Af Amer: 53 mL/min — ABNORMAL LOW (ref >60)
GFR calc non Af Amer: 45 mL/min — ABNORMAL LOW (ref >60)
Glucose, Bld: 214 mg/dL — ABNORMAL HIGH (ref 70–99)
Potassium: 3.6 mmol/L (ref 3.5–5.1)
Sodium: 138 mmol/L (ref 135–145)

## 2018-07-20 LAB — GLUCOSE, CAPILLARY
Glucose-Capillary: 186 mg/dL — ABNORMAL HIGH (ref 70–99)
Glucose-Capillary: 193 mg/dL — ABNORMAL HIGH (ref 70–99)
Glucose-Capillary: 197 mg/dL — ABNORMAL HIGH (ref 70–99)
Glucose-Capillary: 200 mg/dL — ABNORMAL HIGH (ref 70–99)
Glucose-Capillary: 204 mg/dL — ABNORMAL HIGH (ref 70–99)
Glucose-Capillary: 209 mg/dL — ABNORMAL HIGH (ref 70–99)
Glucose-Capillary: 224 mg/dL — ABNORMAL HIGH (ref 70–99)

## 2018-07-20 LAB — CBC
HCT: 24.9 % — ABNORMAL LOW (ref 39.0–52.0)
Hemoglobin: 7.5 g/dL — ABNORMAL LOW (ref 13.0–17.0)
MCH: 27.5 pg (ref 26.0–34.0)
MCHC: 30.1 g/dL (ref 30.0–36.0)
MCV: 91.2 fL (ref 80.0–100.0)
Platelets: 515 10*3/uL — ABNORMAL HIGH (ref 150–400)
RBC: 2.73 MIL/uL — ABNORMAL LOW (ref 4.22–5.81)
RDW: 15.1 % (ref 11.5–15.5)
WBC: 16.5 10*3/uL — ABNORMAL HIGH (ref 4.0–10.5)
nRBC: 0 % (ref 0.0–0.2)

## 2018-07-20 LAB — MAGNESIUM: Magnesium: 1.7 mg/dL (ref 1.7–2.4)

## 2018-07-20 LAB — PHOSPHORUS: Phosphorus: 3.3 mg/dL (ref 2.5–4.6)

## 2018-07-20 LAB — LIPASE, BLOOD: Lipase: 37 U/L (ref 11–51)

## 2018-07-20 MED ORDER — POTASSIUM CHLORIDE 10 MEQ/50ML IV SOLN
10.0000 meq | INTRAVENOUS | Status: AC
  Administered 2018-07-20 (×4): 10 meq via INTRAVENOUS
  Filled 2018-07-20 (×4): qty 50

## 2018-07-20 MED ORDER — MAGNESIUM SULFATE IN D5W 1-5 GM/100ML-% IV SOLN
1.0000 g | Freq: Once | INTRAVENOUS | Status: AC
  Administered 2018-07-20: 1 g via INTRAVENOUS
  Filled 2018-07-20: qty 100

## 2018-07-20 MED ORDER — TRAVASOL 10 % IV SOLN
INTRAVENOUS | Status: AC
  Administered 2018-07-20: 18:00:00 via INTRAVENOUS
  Filled 2018-07-20: qty 1209.6

## 2018-07-20 MED ORDER — PIPERACILLIN-TAZOBACTAM 3.375 G IVPB
3.3750 g | Freq: Three times a day (TID) | INTRAVENOUS | Status: AC
  Administered 2018-07-20 – 2018-07-28 (×22): 3.375 g via INTRAVENOUS
  Filled 2018-07-20 (×22): qty 50

## 2018-07-20 NOTE — Progress Notes (Signed)
Ng tube advanced 3.5 cm. Pt tolerated well. Auscultated for air in the abdomen. Will notify PA for need for additional xray.

## 2018-07-20 NOTE — Progress Notes (Addendum)
Nurse rounded on patient and found that patient had pulled out his NG tube. Called MD on call for vascular and was verbally told to reinsert NG tube. NG tube reinserted at bedside. Also put in for xray to confirm NG placement. Will continue to monitor. Lajoyce Corners, RN

## 2018-07-20 NOTE — Progress Notes (Addendum)
OT Cancellation Note  Patient Details Name: Christian Bailey MRN: [id number] DOB: 11-15-1940   Cancelled Treatment:    Reason Eval/Treat Not Completed: Fatigue/lethargy limiting ability to participate. Per RN and NT, pt very sleepy, confused and not feeling well today, Will hold OT today and try again tomorrow as appropriate  Britt Bottom 07/20/2018, 9:38 AM

## 2018-07-20 NOTE — Progress Notes (Signed)
Chief Complaint: Patient was seen today for perc chole drain  Supervising Physician: Marybelle Killings  Patient Status: [facility] - In-pt  Subjective: S/p perc chole drain yesterday Feeling better today. Some soreness at drain site  Objective: Physical Exam: BP 136/70 (BP Location: Left Arm)   Pulse 99   Temp 99.3 F (37.4 C) (Axillary)   Resp 14   Ht 5\' 5"  (1.651 m)   Wt 70.2 kg   SpO2 99%   BMI 25.75 kg/m  RUQ drain intact, site clean. Dark bilious output   Current Facility-Administered Medications:  .  0.9 %  sodium chloride infusion, 500 mL, Intravenous, Once PRN, Eveland, Matthew, PA-C .  0.9 %  sodium chloride infusion, , Intravenous, Continuous, Serafina Mitchell, MD, Last Rate: 50 mL/hr at 07/20/18 0829 .  acetaminophen (TYLENOL) tablet 325-650 mg, 325-650 mg, Oral, Q4H PRN, 650 mg at 07/12/18 1639 **OR** acetaminophen (TYLENOL) suppository 325-650 mg, 325-650 mg, Rectal, Q4H PRN, Eveland, Matthew, PA-C .  alum & mag hydroxide-simeth (MAALOX/MYLANTA) 200-200-20 MG/5ML suspension 15-30 mL, 15-30 mL, Oral, Q2H PRN, Eveland, Matthew, PA-C .  bisacodyl (DULCOLAX) suppository 10 mg, 10 mg, Rectal, Daily PRN, Dagoberto Ligas, PA-C, 10 mg at 07/15/18 1538 .  clopidogrel (PLAVIX) tablet 75 mg, 75 mg, Oral, Daily, Laurence Slate M, PA-C, 75 mg at 07/20/18 0093 .  diltiazem (CARDIZEM) 1 mg/mL load via infusion 10 mg, 10 mg, Intravenous, Continuous, Last Rate: 200 mL/hr at 07/19/18 0549, 10 mg at 07/19/18 0549 **AND** diltiazem (CARDIZEM) 100 mg in dextrose 5% 151mL (1 mg/mL) infusion, 5-15 mg/hr, Intravenous, Continuous, Fields, Jessy Oto, MD, Last Rate: 5 mL/hr at 07/20/18 0319, 5 mg/hr at 07/20/18 0319 .  docusate sodium (COLACE) capsule 100 mg, 100 mg, Oral, Daily, Dagoberto Ligas, PA-C, 100 mg at 07/20/18 8182 .  guaiFENesin-dextromethorphan (ROBITUSSIN DM) 100-10 MG/5ML syrup 15 mL, 15 mL, Oral, Q4H PRN, Cameron Proud, Matthew, PA-C .  heparin injection 5,000 Units, 5,000 Units,  Subcutaneous, Q8H, Serafina Mitchell, MD, 5,000 Units at 07/20/18 9937 .  hydrALAZINE (APRESOLINE) injection 5 mg, 5 mg, Intravenous, Q20 Min PRN, Dagoberto Ligas, PA-C, 5 mg at 07/19/18 0206 .  insulin aspart (novoLOG) injection 0-15 Units, 0-15 Units, Subcutaneous, Q4H, Serafina Mitchell, MD, 3 Units at 07/20/18 [phone]  magnesium sulfate IVPB 1 g 100 mL, 1 g, Intravenous, Once, Dang, Thuy D, RPH .  magnesium sulfate IVPB 2 g 50 mL, 2 g, Intravenous, Daily PRN, Dagoberto Ligas, PA-C .  MEDLINE mouth rinse, 15 mL, Mouth Rinse, BID, Eveland, Matthew, PA-C, 15 mL at 07/20/18 7893 .  metoprolol tartrate (LOPRESSOR) injection 2.5 mg, 2.5 mg, Intravenous, Q6H, Rhyne, Samantha J, PA-C, 2.5 mg at 07/20/18 8101 .  metoprolol tartrate (LOPRESSOR) injection 5 mg, 5 mg, Intravenous, Q6H PRN, Marty Heck, MD, 5 mg at 07/18/18 2358 .  mupirocin ointment (BACTROBAN) 2 %, , Nasal, BID, Eveland, Matthew, PA-C .  ondansetron [facility]) injection 4 mg, 4 mg, Intravenous, Q6H PRN, Dagoberto Ligas, PA-C, 4 mg at 07/16/18 0925 .  pantoprazole (PROTONIX) injection 40 mg, 40 mg, Intravenous, Daily, Dagoberto Ligas, PA-C, 40 mg at 07/20/18 7510 .  phenol (CHLORASEPTIC) mouth spray 1 spray, 1 spray, Mouth/Throat, PRN, Eveland, Matthew, PA-C .  piperacillin-tazobactam (ZOSYN) IVPB 3.375 g, 3.375 g, Intravenous, Q8H, Serafina Mitchell, MD, Last Rate: 12.5 mL/hr at 07/20/18 0443, 3.375 g at 07/20/18 0443 .  potassium chloride 10 mEq in 50 mL *CENTRAL LINE* IVPB, 10 mEq, Intravenous, Q1 Hr x 4, Dang, Thuy D, RPH .  rosuvastatin (CRESTOR) tablet 20 mg, 20 mg, Oral, QPM, Eveland, Matthew, PA-C, 20 mg at 07/19/18 1545 .  sodium chloride flush (NS) 0.9 % injection 10-40 mL, 10-40 mL, Intracatheter, Q12H, Serafina Mitchell, MD, 20 mL at 07/19/18 2115 .  sodium chloride flush (NS) 0.9 % injection 10-40 mL, 10-40 mL, Intracatheter, PRN, Serafina Mitchell, MD, 10 mL at 07/18/18 1733 .  sodium chloride flush (NS) 0.9 % injection 5  mL, 5 mL, Intracatheter, Q8H, Arne Cleveland, MD, 5 mL at 07/20/18 0545 .  TPN ADULT (ION), , Intravenous, Continuous TPN, Tyrone Apple, RPH, Last Rate: 80 mL/hr at 07/20/18 0319 .  TPN ADULT (ION), , Intravenous, Continuous TPN, Dang, Thuy D, RPH .  traMADol Veatrice Bourbon) tablet 50 mg, 50 mg, Oral, Q6H, Laurence Slate M, [place], 50 mg at 07/20/18 5277  Labs: CBC Recent Labs    07/19/18 0522 07/20/18 0610  WBC 22.1* 16.5*  HGB 8.2* 7.5*  HCT 25.0* 24.9*  PLT 567* 515*   BMET Recent Labs    07/19/18 0432 07/20/18 0434  NA 134* 138  K 3.5 3.6  CL 103 109  CO2 23 22  GLUCOSE 215* 214*  BUN 31* 31*  CREATININE 1.55* 1.47*  CALCIUM 8.0* 7.9*   LFT Recent Labs    07/18/18 0437 07/20/18 0434  PROT 5.3*  --   ALBUMIN 2.0*  --   AST 31  --   ALT 16  --   ALKPHOS 86  --   BILITOT 1.0  --   LIPASE  --  37   PT/INR No results for input(s): LABPROT, INR in the last 72 hours.   Studies/Results: Ct Abdomen Pelvis Wo Contrast  Result Date: 07/18/2018 CLINICAL DATA:  Abdominal distension, nausea and vomiting. Status post aortobifemoral bypass graft with leukocytosis. Increased weakness. EXAM: CT ABDOMEN AND PELVIS WITHOUT CONTRAST TECHNIQUE: Multidetector CT imaging of the abdomen and pelvis was performed following the standard protocol without IV contrast. COMPARISON:  07/16/2018. FINDINGS: Lower chest: Again demonstrated are moderate-sized bilateral pleural effusions, without significant change. Associated bilateral compressive lower lobe atelectasis is also unchanged. Minimal pericardial effusion with a maximum thickness of 11 mm. Hepatobiliary: Stable small medial segment left lobe liver cyst. Ill-defined low density in the right lobe of the liver due to streak artifacts produced by the patient's right arm. The gallbladder remains dilated with poorly defined margins and mild pericholecystic edema. Pancreas: Unremarkable. No pancreatic ductal dilatation or surrounding inflammatory  changes. Spleen: Normal in size without focal abnormality. Adrenals/Urinary Tract: Normal appearing adrenal glands. Bilateral renal cysts. Tiny mid right renal calculus. Air in the urinary bladder Stomach/Bowel: No significant change in gaseous distention of the transverse colon. The previously seen gas distended small bowel loops are normal in caliber. Decompressed stomach with a nasogastric tube in place. Mild diffuse gastric wall thickening, accentuated by the lack of distention. Vascular/Lymphatic: The previously demonstrated heterogeneous 3.2 x 1.7 cm fluid collection adjacent to the right iliac portion of the aorta by femoral graft measures 4.6 x 2.9 cm on image number 65 series 3. The previously demonstrated 10.3 x 6.0 cm larger, similar-appearing collection on the left, measures 10.0 x 7.4 cm on image number 58 series 3. This currently measures 12.9 cm in length on coronal image number 84, previously 13.2 cm. A fluid collection adjacent to the proximal aortic portion of the graft measures 1.8 x 1.0 cm on image number 36 series 3, previously 2.0 x 1.2 cm. Atheromatous arterial calcifications. Reproductive: Mildly enlarged prostate gland  containing coarse calcifications. Other: Small right and small to moderate-sized left inguinal hernias containing fat. Small amount of free peritoneal fluid. Bilateral subcutaneous edema. A small triangular-shaped fluid collection inferior to the pancreatic body/tail junction measures 2.4 x 1.7 cm on image number 34 and 6 Hounsfield units in density, without significant change. Musculoskeletal: Extensive lumbar and lower thoracic spine degenerative changes with changes of DISH. Mild bilateral hip degenerative changes. IMPRESSION: 1. Mild increase in size of a right pelvic retroperitoneal hematoma measuring 4.6 x 2.9 cm today. 2. No significant change in a large left pelvic retroperitoneal hematoma. 3. No significant change in moderate-sized bilateral pleural effusions and  associated bilateral compressive lower lobe atelectasis. 4. Stable dilated gallbladder with poorly defined margins and mild pericholecystic edema. This could be due to cholecystitis. 5. Mild diffuse gastric wall thickening, accentuated by the lack of distention. 6. Mild increase in size of a small fluid collections adjacent to the aortic graft, as described above. 7. Stable small amount of free peritoneal fluid. Electronically Signed   By: Claudie Revering M.D.   On: 07/18/2018 15:12   Dg Abd 1 View  Result Date: 07/18/2018 CLINICAL DATA:  Enteric tube advanced into stomach under fluoroscopy. EXAM: ABDOMEN - 1 VIEW; DG NASO G TUBE PLC W/FL-NO RAD COMPARISON:  07/17/2018 abdominal radiograph FINDINGS: Fluoroscopy time 1 minutes 7 seconds. Enteric tube tip advanced to the body of the stomach. IMPRESSION: Enteric tube is now well positioned with the tip in the body of the stomach after advancement under fluoroscopy. Electronically Signed   By: Ilona Sorrel M.D.   On: 07/18/2018 09:50   Nm Hepatobiliary Liver Func  Result Date: 07/19/2018 CLINICAL DATA:  78 y/o  M; right upper quadrant pain and tenderness. EXAM: NUCLEAR MEDICINE HEPATOBILIARY IMAGING TECHNIQUE: Sequential images of the abdomen were obtained out to 60 minutes following intravenous administration of radiopharmaceutical. RADIOPHARMACEUTICALS:  5.4 mCi Tc-73m  Choletec IV COMPARISON:  None. FINDINGS: Prompt uptake and biliary excretion of activity by the liver is seen. No gallbladder activity is visualized, consistent with obstruction of cystic duct. Biliary activity passes into small bowel, consistent with patent common bile duct. IMPRESSION: No gallbladder activity consistent with obstruction of cystic duct and acute cholecystitis. These results will be called to the ordering clinician or representative by the Radiologist Assistant, and communication documented in the PACS or zVision Dashboard. Electronically Signed   By: Kristine Garbe M.D.    On: 07/19/2018 15:07   Ir Perc Cholecystostomy  Result Date: 07/20/2018 CLINICAL DATA:  Acute cholecystitis EXAM: PERCUTANEOUS CHOLECYSTOSTOMY TUBE PLACEMENT WITH ULTRASOUND AND FLUOROSCOPIC GUIDANCE FLUOROSCOPY TIME:  0.9 minutes; 66 uGym2 DAP TECHNIQUE: The procedure, risks (including but not limited to bleeding, infection, organ damage ), benefits, and alternatives were explained to the patient. Questions regarding the procedure were encouraged and answered. The patient understands and consents to the procedure. Survey ultrasound of the abdomen was performed and an appropriate skin entry site was identified. Skin site was marked, prepped with chlorhexidine, and draped in usual sterile fashion, and infiltrated locally with 1% lidocaine. Intravenous Fentanyl and Versed were administered as conscious sedation during continuous monitoring of the patient's level of consciousness and physiological / cardiorespiratory status by the radiology RN, with a total moderate sedation time of 14 minutes. Under real-time ultrasound guidance, gallbladder was accessed using a transhepatic approach with a 21-gauge needle. Ultrasound image documentation was saved. Bile returned through the hub. Needle was exchanged over a 018 guidewire for transitional dilator which allowed placement of 035 J  wire. Over this, a 10.2 French pigtail catheter was advanced and formed centrally in the gallbladder lumen. Small contrast injection confirmed appropriate position. Catheter secured externally with 0 Prolene suture and placed external drain bag. Patient tolerated the procedure well. COMPLICATIONS: COMPLICATIONS none IMPRESSION: 1. Technically successful percutaneous cholecystostomy tube placement with ultrasound and fluoroscopic guidance. Electronically Signed   By: Lucrezia Europe M.D.   On: 07/20/2018 08:02   Dg Abd Portable 1v  Result Date: 07/20/2018 CLINICAL DATA:  Check gastric catheter placement EXAM: PORTABLE ABDOMEN - 1 VIEW  COMPARISON:  07/18/2018 FINDINGS: Gastric catheter is noted with the tip in the stomach. Proximal side port lies in the distal esophagus. This could be advanced several cm. Cholecystostomy tube is noted in the right upper quadrant. Scattered large and small bowel gas is noted. No obstructive changes are seen. Degenerative changes of the thoracolumbar spine are noted. IMPRESSION: Gastric catheter as described.  This could be advanced several cm. Electronically Signed   By: Inez Catalina M.D.   On: 07/20/2018 07:44   Dg Addison Bailey G Tube Plc W/fl-no Rad  Result Date: 07/18/2018 CLINICAL DATA:  Enteric tube advanced into stomach under fluoroscopy. EXAM: ABDOMEN - 1 VIEW; DG NASO G TUBE PLC W/FL-NO RAD COMPARISON:  07/17/2018 abdominal radiograph FINDINGS: Fluoroscopy time 1 minutes 7 seconds. Enteric tube tip advanced to the body of the stomach. IMPRESSION: Enteric tube is now well positioned with the tip in the body of the stomach after advancement under fluoroscopy. Electronically Signed   By: Ilona Sorrel M.D.   On: 07/18/2018 09:50   US Abdomen Limited Ruq  Result Date: 07/19/2018 CLINICAL DATA:  Abdominal pain. EXAM: ULTRASOUND ABDOMEN LIMITED RIGHT UPPER QUADRANT COMPARISON:  CT 07/18/2018. FINDINGS: Gallbladder: Sludge in the gallbladder. Gallbladder wall thickness 4.2 mm. Pericholecystic fluid noted. Negative Murphy sign. Common bile duct: Diameter: 5.8 mm Liver: 1.2 cm thinly septated cyst left hepatic lobe, most likely benign. Similar findings on prior exam. Within normal limits in parenchymal echogenicity. Portal vein is patent on color Doppler imaging with normal direction of blood flow towards the liver. Incidental note is made of right pleural effusion. IMPRESSION: 1. Large amount of sludge noted. Gallbladder wall is thickened at 4.2 mm. Pericholecystic fluid noted. Cholecystitis can not be excluded. 2. Incidental note is made of right pleural effusion. Electronically Signed   By: Marcello Moores  Register   On:  07/19/2018 10:52    Assessment/Plan: Cholecystitis S/p perc chole drain 1/21 Drain functioning well. IR following    LOS: 14 days   I spent a total of 15 minutes in face to face in clinical consultation, greater than 50% of which was counseling/coordinating care for perc chole drain  Lucious Zou PA-C 07/20/2018 8:39 AM

## 2018-07-20 NOTE — Progress Notes (Signed)
Pharmacy Antibiotic Note  Christian Bailey is a 78 y.o. male admitted on 07/06/2018 with recurrent claudication now s/p aortobifemoral bypass graft and revised fempop bypass graft on 07/06/18. Post-op, patient has had RUQ abdominal pain, nausea, and abdominal distention and she is now s/p percutaneous cholecystostomy placement with drain placed (1/21). -Zosyn day4 -WBC= 16.5, afebrile, CrCl ~ 35, cultures- ngtd  Plan: Zosyn 3.375g IV q8h F/u infectious w/u, clinical status, renal function, de-escalation, LOT  Height: 5\' 5"  (165.1 cm) Weight: 154 lb 12.2 oz (70.2 kg) IBW/kg (Calculated) : 61.5  Temp (24hrs), Avg:98.3 F (36.8 C), Min:97.8 F (36.6 C), Max:99.3 F (37.4 C)  Recent Labs  Lab 07/16/18 0353  07/17/18 0859 07/17/18 1144 07/17/18 1714 07/18/18 0437 07/19/18 0432 07/19/18 0522 07/20/18 0434 07/20/18 0610  WBC 19.3*   < >  --  41.1* 34.7* 27.0*  --  22.1*  --  16.5*  CREATININE 1.76*  --  1.38*  --   --  1.61* 1.55*  --  1.47*  --    < > = values in this interval not displayed.    Estimated Creatinine Clearance: 36.6 mL/min (A) (by C-G formula based on SCr of 1.47 mg/dL (H)).    Allergies  Allergen Reactions  . Asa [Aspirin] Other (See Comments)    GI bleeding  . Gadolinium Derivatives Other (See Comments)    Pt complained of face flushing/hottness and throat tightness/scratchiness immediately after the injections.  Within 4 minutes, all symptoms were gone and the study was completed.  No further complications or signs of allergy were exhibited after completion of study.   . Oxycodone-Acetaminophen Other (See Comments)    Dizziness, uncomfortable    Antimicrobials this admission: Zosyn 1/19 >>  Microbiology results: 1/21 bile- ngtd  Hildred Laser, PharmD Clinical Pharmacist **Pharmacist phone directory can now be found on [email or web address] ([email or web address]).  Listed under [place].

## 2018-07-20 NOTE — Progress Notes (Signed)
PHARMACY - ADULT TOTAL PARENTERAL NUTRITION CONSULT NOTE   Pharmacy Consult:  TPN Indication: Prolonged ileus  Patient Measurements: Height: 5\' 5"  (165.1 cm) Weight: 154 lb 12.2 oz (70.2 kg) IBW/kg (Calculated) : 61.5 TPN AdjBW (KG): 68.9 Body mass index is 25.75 kg/m. Usual Weight: 68.9 kg  Assessment:  35 YOM admitted with recurrent claudication s/p aortobifemoral bypass graft and revised right fem-pop bypass graft on 07/06/18. Clears initiated 1/14 and diet was advanced on 1/16 but patient has had poor intake (0-25% recorded). On 1/17, patient began having RUQ abdominal pain with nausea and sluggish bowel sounds. Symptoms continued to worsen POD#10 (1/18) including increased nausea with sensation to vomit and increased abdominal distension. Patient was receiving dextrose in IVF during this time. Patient was made NPO and pharmacy was consulted to mange TPN for prolonged ileus.   GI: prealbumin <5, amylase 118, LBM 1/15, +flatus, NG O/P 21mL, right groin drainage 125mL.  PPI IV, docusate.  1/20 CT - right pelvic RP hematoma bigger 1/21 HIDA positive for cholecystitis >> perc tube placed Endo: hx DM on metformin PTA - CBGs improving with increased insulin in TPN Insulin requirements in the past 24 hours: 28 units + 20 units in TPN Lytes: K 3.6 (goal >/=4 for ileus), low Na, Mag high normal Renal: AKI - SCr down 1.47 (BL SCr 1.3-1.5), BUN 31 - UOP 1.3 ml/kghr, NS at 50/hr, net +6.9L Pulm: stable on RA Cards: chronic RBBB, CAD, PAD, HTN, HLD - VSS - Plavix, IV metoprolol, Crestor, diltiazem gtt, PRN hydralazine/metoprolol Hepatobil: LFTs / tbili / TG WNL. Neuro: tramadol, PRN Versed - pain controlled ID: Zosyn (1/19 >> ) for IAI - afebrile, WBC down to 16.5, no micro TPN Access: PICC 07/16/18 TPN start date: 07/16/18   Nutritional Goals (per RD rec on 1/18): 2000-2200 kCal, 105-125g protein, 2-2.2L fluid per day  Current Nutrition:  TPN  Plan:  Continue TPN at goal rate of 80 ml/hr,  providing 121g AA, 269g CHO and 62g ILE for a total of 2013 kCal, meeting 100% of patient's needs  Electrolytes in TPN: incr K/Mag, change Cl:Ac 2:1 Daily multivitamin and trace elements in TPN Continue moderate SSI Q4H + increase regular insulin in TPN to 27 units NS at 50 ml/hr per MD KCL x 4 runs Mag sulfate 1gm IV x 1 F/U AM labs, CBGs   Christian Bailey, PharmD, BCPS, [place] 07/20/2018, 8:24 AM

## 2018-07-20 NOTE — Progress Notes (Signed)
PT Cancellation Note  Patient Details Name: Christian Bailey MRN: [id number] DOB: 1941-05-31   Cancelled Treatment:    Reason Eval/Treat Not Completed: Fatigue/lethargy limiting ability to participate. Per RN and NT, pt very sleepy, confused and not feeling well today. RN requesting PT hold therapy today. Will re-attempt when patient is appropriate.  Ellamae Sia, PT, DPT Acute Rehabilitation Services Pager [phone] Office [phone]    Willy Eddy 07/20/2018, 10:16 AM

## 2018-07-20 NOTE — Progress Notes (Signed)
Patient ID: Christian Bailey, male   DOB: [birth year], 78 y.o.   MRN: [id number]    14 Days Post-Op  Subjective: Patient feels much better today.  The pain he was having yesterday is much better after perc chole drain placement.  Passing a little flatus.  Objective: Vital signs in last 24 hours: Temp:  [97.8 F (36.6 C)-99.3 F (37.4 C)] 99.3 F (37.4 C) (01/22 0800) Pulse Rate:  [73-119] 99 (01/22 0800) Resp:  [12-26] 14 (01/22 0800) BP: (111-178)/(54-80) 136/70 (01/22 0800) SpO2:  [93 %-100 %] 99 % (01/22 0800) Last BM Date: 07/17/18  Intake/Output from previous day: 01/21 0701 - 01/22 0700 In: 1751.4 [I.V.:1631.4; NG/GT:70; IV Piggyback:50] Out: 2435 [Urine:2200; Emesis/NG output:60; Drains:175] Intake/Output this shift: Total I/O In: -  Out: 200 [Urine:200]  PE: Heart: 90s today, much less tachy  Lungs: CTAB Abd: still distended, but nontender, except minimally in RUQ as expected.  Perc chole drain in place in RUQ with thick bilious output in drain.  Hypoactive BS.  NGT with essentially no output.  Lab Results:  Recent Labs    07/19/18 0522 07/20/18 0610  WBC 22.1* 16.5*  HGB 8.2* 7.5*  HCT 25.0* 24.9*  PLT 567* 515*   BMET Recent Labs    07/19/18 0432 07/20/18 0434  NA 134* 138  K 3.5 3.6  CL 103 109  CO2 23 22  GLUCOSE 215* 214*  BUN 31* 31*  CREATININE 1.55* 1.47*  CALCIUM 8.0* 7.9*   PT/INR No results for input(s): LABPROT, INR in the last 72 hours. CMP     Component Value Date/Time   NA 138 07/20/2018 0434   K 3.6 07/20/2018 0434   CL 109 07/20/2018 0434   CO2 22 07/20/2018 0434   GLUCOSE 214 (H) 07/20/2018 0434   BUN 31 (H) 07/20/2018 0434   CREATININE 1.47 (H) 07/20/2018 0434   CREATININE 1.33 (H) 04/10/2016 0851   CALCIUM 7.9 (L) 07/20/2018 0434   PROT 5.3 (L) 07/18/2018 0437   ALBUMIN 2.0 (L) 07/18/2018 0437   AST 31 07/18/2018 0437   ALT 16 07/18/2018 0437   ALKPHOS 86 07/18/2018 0437   BILITOT 1.0 07/18/2018 0437   GFRNONAA 45 (L)  07/20/2018 0434   GFRAA 53 (L) 07/20/2018 0434   Lipase     Component Value Date/Time   LIPASE 37 07/20/2018 0434       Studies/Results: Ct Abdomen Pelvis Wo Contrast  Result Date: 07/18/2018 CLINICAL DATA:  Abdominal distension, nausea and vomiting. Status post aortobifemoral bypass graft with leukocytosis. Increased weakness. EXAM: CT ABDOMEN AND PELVIS WITHOUT CONTRAST TECHNIQUE: Multidetector CT imaging of the abdomen and pelvis was performed following the standard protocol without IV contrast. COMPARISON:  07/16/2018. FINDINGS: Lower chest: Again demonstrated are moderate-sized bilateral pleural effusions, without significant change. Associated bilateral compressive lower lobe atelectasis is also unchanged. Minimal pericardial effusion with a maximum thickness of 11 mm. Hepatobiliary: Stable small medial segment left lobe liver cyst. Ill-defined low density in the right lobe of the liver due to streak artifacts produced by the patient's right arm. The gallbladder remains dilated with poorly defined margins and mild pericholecystic edema. Pancreas: Unremarkable. No pancreatic ductal dilatation or surrounding inflammatory changes. Spleen: Normal in size without focal abnormality. Adrenals/Urinary Tract: Normal appearing adrenal glands. Bilateral renal cysts. Tiny mid right renal calculus. Air in the urinary bladder Stomach/Bowel: No significant change in gaseous distention of the transverse colon. The previously seen gas distended small bowel loops are normal in caliber. Decompressed stomach with a  nasogastric tube in place. Mild diffuse gastric wall thickening, accentuated by the lack of distention. Vascular/Lymphatic: The previously demonstrated heterogeneous 3.2 x 1.7 cm fluid collection adjacent to the right iliac portion of the aorta by femoral graft measures 4.6 x 2.9 cm on image number 65 series 3. The previously demonstrated 10.3 x 6.0 cm larger, similar-appearing collection on the left,  measures 10.0 x 7.4 cm on image number 58 series 3. This currently measures 12.9 cm in length on coronal image number 84, previously 13.2 cm. A fluid collection adjacent to the proximal aortic portion of the graft measures 1.8 x 1.0 cm on image number 36 series 3, previously 2.0 x 1.2 cm. Atheromatous arterial calcifications. Reproductive: Mildly enlarged prostate gland containing coarse calcifications. Other: Small right and small to moderate-sized left inguinal hernias containing fat. Small amount of free peritoneal fluid. Bilateral subcutaneous edema. A small triangular-shaped fluid collection inferior to the pancreatic body/tail junction measures 2.4 x 1.7 cm on image number 34 and 6 Hounsfield units in density, without significant change. Musculoskeletal: Extensive lumbar and lower thoracic spine degenerative changes with changes of DISH. Mild bilateral hip degenerative changes. IMPRESSION: 1. Mild increase in size of a right pelvic retroperitoneal hematoma measuring 4.6 x 2.9 cm today. 2. No significant change in a large left pelvic retroperitoneal hematoma. 3. No significant change in moderate-sized bilateral pleural effusions and associated bilateral compressive lower lobe atelectasis. 4. Stable dilated gallbladder with poorly defined margins and mild pericholecystic edema. This could be due to cholecystitis. 5. Mild diffuse gastric wall thickening, accentuated by the lack of distention. 6. Mild increase in size of a small fluid collections adjacent to the aortic graft, as described above. 7. Stable small amount of free peritoneal fluid. Electronically Signed   By: Claudie Revering M.D.   On: 07/18/2018 15:12   Dg Abd 1 View  Result Date: 07/18/2018 CLINICAL DATA:  Enteric tube advanced into stomach under fluoroscopy. EXAM: ABDOMEN - 1 VIEW; DG NASO G TUBE PLC W/FL-NO RAD COMPARISON:  07/17/2018 abdominal radiograph FINDINGS: Fluoroscopy time 1 minutes 7 seconds. Enteric tube tip advanced to the body of the  stomach. IMPRESSION: Enteric tube is now well positioned with the tip in the body of the stomach after advancement under fluoroscopy. Electronically Signed   By: Ilona Sorrel M.D.   On: 07/18/2018 09:50   Nm Hepatobiliary Liver Func  Result Date: 07/19/2018 CLINICAL DATA:  78 y/o  M; right upper quadrant pain and tenderness. EXAM: NUCLEAR MEDICINE HEPATOBILIARY IMAGING TECHNIQUE: Sequential images of the abdomen were obtained out to 60 minutes following intravenous administration of radiopharmaceutical. RADIOPHARMACEUTICALS:  5.4 mCi Tc-11m  Choletec IV COMPARISON:  None. FINDINGS: Prompt uptake and biliary excretion of activity by the liver is seen. No gallbladder activity is visualized, consistent with obstruction of cystic duct. Biliary activity passes into small bowel, consistent with patent common bile duct. IMPRESSION: No gallbladder activity consistent with obstruction of cystic duct and acute cholecystitis. These results will be called to the ordering clinician or representative by the Radiologist Assistant, and communication documented in the PACS or zVision Dashboard. Electronically Signed   By: Kristine Garbe M.D.   On: 07/19/2018 15:07   Ir Perc Cholecystostomy  Result Date: 07/20/2018 CLINICAL DATA:  Acute cholecystitis EXAM: PERCUTANEOUS CHOLECYSTOSTOMY TUBE PLACEMENT WITH ULTRASOUND AND FLUOROSCOPIC GUIDANCE FLUOROSCOPY TIME:  0.9 minutes; 39 uGym2 DAP TECHNIQUE: The procedure, risks (including but not limited to bleeding, infection, organ damage ), benefits, and alternatives were explained to the patient. Questions regarding the  procedure were encouraged and answered. The patient understands and consents to the procedure. Survey ultrasound of the abdomen was performed and an appropriate skin entry site was identified. Skin site was marked, prepped with chlorhexidine, and draped in usual sterile fashion, and infiltrated locally with 1% lidocaine. Intravenous Fentanyl and Versed were  administered as conscious sedation during continuous monitoring of the patient's level of consciousness and physiological / cardiorespiratory status by the radiology RN, with a total moderate sedation time of 14 minutes. Under real-time ultrasound guidance, gallbladder was accessed using a transhepatic approach with a 21-gauge needle. Ultrasound image documentation was saved. Bile returned through the hub. Needle was exchanged over a 018 guidewire for transitional dilator which allowed placement of 035 J wire. Over this, a 10.2 French pigtail catheter was advanced and formed centrally in the gallbladder lumen. Small contrast injection confirmed appropriate position. Catheter secured externally with 0 Prolene suture and placed external drain bag. Patient tolerated the procedure well. COMPLICATIONS: COMPLICATIONS none IMPRESSION: 1. Technically successful percutaneous cholecystostomy tube placement with ultrasound and fluoroscopic guidance. Electronically Signed   By: Lucrezia Europe M.D.   On: 07/20/2018 08:02   Dg Abd Portable 1v  Result Date: 07/20/2018 CLINICAL DATA:  Check gastric catheter placement EXAM: PORTABLE ABDOMEN - 1 VIEW COMPARISON:  07/18/2018 FINDINGS: Gastric catheter is noted with the tip in the stomach. Proximal side port lies in the distal esophagus. This could be advanced several cm. Cholecystostomy tube is noted in the right upper quadrant. Scattered large and small bowel gas is noted. No obstructive changes are seen. Degenerative changes of the thoracolumbar spine are noted. IMPRESSION: Gastric catheter as described.  This could be advanced several cm. Electronically Signed   By: Inez Catalina M.D.   On: 07/20/2018 07:44   Dg Addison Bailey G Tube Plc W/fl-no Rad  Result Date: 07/18/2018 CLINICAL DATA:  Enteric tube advanced into stomach under fluoroscopy. EXAM: ABDOMEN - 1 VIEW; DG NASO G TUBE PLC W/FL-NO RAD COMPARISON:  07/17/2018 abdominal radiograph FINDINGS: Fluoroscopy time 1 minutes 7 seconds.  Enteric tube tip advanced to the body of the stomach. IMPRESSION: Enteric tube is now well positioned with the tip in the body of the stomach after advancement under fluoroscopy. Electronically Signed   By: Ilona Sorrel M.D.   On: 07/18/2018 09:50   US Abdomen Limited Ruq  Result Date: 07/19/2018 CLINICAL DATA:  Abdominal pain. EXAM: ULTRASOUND ABDOMEN LIMITED RIGHT UPPER QUADRANT COMPARISON:  CT 07/18/2018. FINDINGS: Gallbladder: Sludge in the gallbladder. Gallbladder wall thickness 4.2 mm. Pericholecystic fluid noted. Negative Murphy sign. Common bile duct: Diameter: 5.8 mm Liver: 1.2 cm thinly septated cyst left hepatic lobe, most likely benign. Similar findings on prior exam. Within normal limits in parenchymal echogenicity. Portal vein is patent on color Doppler imaging with normal direction of blood flow towards the liver. Incidental note is made of right pleural effusion. IMPRESSION: 1. Large amount of sludge noted. Gallbladder wall is thickened at 4.2 mm. Pericholecystic fluid noted. Cholecystitis can not be excluded. 2. Incidental note is made of right pleural effusion. Electronically Signed   By: Marcello Moores  Register   On: 07/19/2018 10:52    Anti-infectives: Anti-infectives (From admission, onward)   Start     Dose/Rate Route Frequency Ordered Stop   07/19/18 1715  ceFAZolin (ANCEF) IVPB 2g/100 mL premix     2 g 200 mL/hr over 30 Minutes Intravenous  Once 07/19/18 1711 07/19/18 1755   07/19/18 1707  ceFAZolin (ANCEF) 2-4 GM/100ML-% IVPB    Note to Pharmacy:  [facility] Lemon, [place]   : cabinet override      07/19/18 1707 07/20/18 0514   07/17/18 2000  metroNIDAZOLE (FLAGYL) IVPB 500 mg  Status:  Discontinued     500 mg 100 mL/hr over 60 Minutes Intravenous Every 6 hours 07/17/18 1956 07/17/18 1959   07/17/18 2000  piperacillin-tazobactam (ZOSYN) IVPB 3.375 g     3.375 g 12.5 mL/hr over 240 Minutes Intravenous Every 8 hours 07/17/18 1959     07/06/18 2200  ceFAZolin (ANCEF) IVPB 2g/100 mL premix      2 g 200 mL/hr over 30 Minutes Intravenous Every 8 hours 07/06/18 1840 07/07/18 0548   07/06/18 0730  ceFAZolin (ANCEF) IVPB 2g/100 mL premix     2 g 200 mL/hr over 30 Minutes Intravenous To [facility] 07/05/18 1140 07/06/18 1443   07/06/18 0700  vancomycin (VANCOCIN) IVPB 1000 mg/200 mL premix     1,000 mg 200 mL/hr over 60 Minutes Intravenous To [facility] 07/05/18 1154 07/06/18 1006       Assessment/Plan Acalculous cholecystitis -s/p perc chole drain by IR on 1/21 -feels better today, WBC down to 16K  -cont abx therapy for gallbladder for now -will follow, will need drain injection in about 4-6 weeks and then decision can be made regarding needs for lap chole.  Given he has no gallstones, he may not need lap chole going forward.  Colonic ileus -still distended. -NGT with minimal output.  Unsure that this is terribly helpful in the setting of a distended colon and not small bowel, but will defer to primary service for further recommendations.    FEN - NPO/NGT/TNA VTE - plavix/heparin ID - zosyn/flagyl Follow up - Dr. Dema Severin   LOS: 9 days    Henreitta Cea , [facility] Surgery 07/20/2018, 8:12 AM Pager: [phone]

## 2018-07-20 NOTE — Progress Notes (Addendum)
[facility]  Subjective  - States he feels better.   Objective 111/71 99 98 F (36.7 C) (Oral) 12 93%  Intake/Output Summary (Last 24 hours) at 07/20/2018 0746 Last data filed at 07/20/2018 0645 Gross per 24 hour  Intake 1751.4 ml  Output 2435 ml  Net -683.6 ml    NG tube replaced emesis 60 cc   Wound vac SS 50 cc over night Palpable DP B LE Abdomin distended with hypo BS  Assessment/Planning: POD # 14 Aortobifem  RUQ u/s today is now positive for cholecystitis Cholecystostomy catheter placed by IR Plan for incisional wound vac to be changed 3 x week by nursing IR/Genral surgery thanks for your help Maintain NG tube NPO/TPN Maintain right groin incisional vac  Christian Bailey 07/20/2018 7:46 AM --  Laboratory Lab Results: Recent Labs    07/19/18 0522 07/20/18 0610  WBC 22.1* 16.5*  HGB 8.2* 7.5*  HCT 25.0* 24.9*  PLT 567* 515*   BMET Recent Labs    07/19/18 0432 07/20/18 0434  NA 134* 138  K 3.5 3.6  CL 103 109  CO2 23 22  GLUCOSE 215* 214*  BUN 31* 31*  CREATININE 1.55* 1.47*  CALCIUM 8.0* 7.9*    COAG Lab Results  Component Value Date   INR 1.25 07/06/2018   INR 1.04 07/01/2018   INR 1.04 09/06/2017   No results found for: PTT  I agree with the above: GI:  Chole tube placed yesterday.  Patient with less RUQ pain.  Suspect recent challenges were related to GB.  Hopefully we will see improvement.  WBC already improved.  COntinue TPN, NG, and NPO ID:  Remains on abx Nutrition:  TPN, NPO Prophylaxis:  SQ heparin and protonix PT/OT:  Needs to get out of bed CV:  HR better after drain.  Will stop dilt drip F/E/N:  Decrease IVF,   Christian Bailey

## 2018-07-21 ENCOUNTER — Encounter (HOSPITAL_COMMUNITY): Payer: Self-pay | Admitting: Cardiology

## 2018-07-21 DIAGNOSIS — I471 Supraventricular tachycardia: Secondary | ICD-10-CM

## 2018-07-21 LAB — COMPREHENSIVE METABOLIC PANEL
ALT: 33 U/L (ref 0–44)
AST: 55 U/L — ABNORMAL HIGH (ref 15–41)
Albumin: 1.8 g/dL — ABNORMAL LOW (ref 3.5–5.0)
Alkaline Phosphatase: 118 U/L (ref 38–126)
Anion gap: 8 (ref 5–15)
BUN: 33 mg/dL — ABNORMAL HIGH (ref 8–23)
CO2: 20 mmol/L — ABNORMAL LOW (ref 22–32)
Calcium: 8.3 mg/dL — ABNORMAL LOW (ref 8.9–10.3)
Chloride: 112 mmol/L — ABNORMAL HIGH (ref 98–111)
Creatinine, Ser: 1.4 mg/dL — ABNORMAL HIGH (ref 0.61–1.24)
GFR calc Af Amer: 56 mL/min — ABNORMAL LOW (ref >60)
GFR calc non Af Amer: 48 mL/min — ABNORMAL LOW (ref >60)
GLUCOSE: 200 mg/dL — AB (ref 70–99)
Potassium: 4.4 mmol/L (ref 3.5–5.1)
Sodium: 140 mmol/L (ref 135–145)
Total Bilirubin: 0.6 mg/dL (ref 0.3–1.2)
Total Protein: 5.7 g/dL — ABNORMAL LOW (ref 6.5–8.1)

## 2018-07-21 LAB — GLUCOSE, CAPILLARY
GLUCOSE-CAPILLARY: 210 mg/dL — AB (ref 70–99)
GLUCOSE-CAPILLARY: 246 mg/dL — AB (ref 70–99)
Glucose-Capillary: 185 mg/dL — ABNORMAL HIGH (ref 70–99)
Glucose-Capillary: 206 mg/dL — ABNORMAL HIGH (ref 70–99)
Glucose-Capillary: 233 mg/dL — ABNORMAL HIGH (ref 70–99)

## 2018-07-21 LAB — PHOSPHORUS: Phosphorus: 2.8 mg/dL (ref 2.5–4.6)

## 2018-07-21 LAB — CBC
HCT: 25.9 % — ABNORMAL LOW (ref 39.0–52.0)
Hemoglobin: 8.1 g/dL — ABNORMAL LOW (ref 13.0–17.0)
MCH: 28.6 pg (ref 26.0–34.0)
MCHC: 31.3 g/dL (ref 30.0–36.0)
MCV: 91.5 fL (ref 80.0–100.0)
PLATELETS: 571 10*3/uL — AB (ref 150–400)
RBC: 2.83 MIL/uL — ABNORMAL LOW (ref 4.22–5.81)
RDW: 15.3 % (ref 11.5–15.5)
WBC: 16.7 10*3/uL — AB (ref 4.0–10.5)
nRBC: 0 % (ref 0.0–0.2)

## 2018-07-21 LAB — MAGNESIUM: Magnesium: 2.1 mg/dL (ref 1.7–2.4)

## 2018-07-21 MED ORDER — LABETALOL HCL 5 MG/ML IV SOLN
10.0000 mg | Freq: Once | INTRAVENOUS | Status: AC
  Administered 2018-07-21: 10 mg via INTRAVENOUS
  Filled 2018-07-21: qty 4

## 2018-07-21 MED ORDER — BISACODYL 10 MG RE SUPP
10.0000 mg | Freq: Once | RECTAL | Status: AC
  Administered 2018-07-21: 10 mg via RECTAL
  Filled 2018-07-21: qty 1

## 2018-07-21 MED ORDER — TRAVASOL 10 % IV SOLN
INTRAVENOUS | Status: AC
  Administered 2018-07-21: 18:00:00 via INTRAVENOUS
  Filled 2018-07-21: qty 1209.6

## 2018-07-21 NOTE — Progress Notes (Signed)
Occupational Therapy Treatment Patient Details Name: Christian Bailey MRN: [id number] DOB: [birth year] Today's Date: 07/21/2018    History of present illness Pt s/p aortobifemoral bypass and redo of rt fem pop bypass on 07/06/18. Hospital course complicated by ileus, NGT place, pt receiving TPN. Percutaneous chole drain placed 1/21. PMH - rt reverse total shoulder, HTN, DM, OA, PAD, lower extremity bypass   OT comments  Pt with improved activity tolerance and no c/o nausea with NGT disconnected for mobility this date. Pt is generally weak and shaky and a high fall risk due to hospital course complications. He has intermittent confusion.  Updated d/c disposition to SNF. Pt and wife in agreement.  Follow Up Recommendations  SNF;Supervision/Assistance - 24 hour    Equipment Recommendations  Other (comment)(defer to next venue)    Recommendations for Other Services      Precautions / Restrictions Precautions Precautions: Fall Precaution Comments: NGT, wound vac, chole drain       Mobility Bed Mobility Overal bed mobility: Needs Assistance Bed Mobility: Rolling;Sidelying to Sit Rolling: Mod assist Sidelying to sit: Mod assist       General bed mobility comments: step by step cues for log roll technique, moderate physical assist  Transfers Overall transfer level: Needs assistance Equipment used: Rolling walker (2 wheeled) Transfers: Sit to/from Stand Sit to Stand: Min assist Stand pivot transfers: Min assist       General transfer comment: increased time and effort today, cues for hand placement, shaky    Balance Overall balance assessment: Needs assistance   Sitting balance-Leahy Scale: Fair Sitting balance - Comments: required several minutes with support and posterior lean prior to being able to sit independently   Standing balance support: Bilateral upper extremity supported Standing balance-Leahy Scale: Poor Standing balance comment: reliance on B UE and external  support, initially with posterior LOB                           ADL either performed or assessed with clinical judgement   ADL Overall ADL's : Needs assistance/impaired     Grooming: Oral care;Sitting;Set up           Upper Body Dressing : Maximal assistance;Sitting Upper Body Dressing Details (indicate cue type and reason): front opening gown     Toilet Transfer: Minimal assistance;Squat-pivot;RW;BSC   Toileting- Clothing Manipulation and Hygiene: Total assistance;Sit to/from stand       Functional mobility during ADLs: Minimal assistance;Rolling walker;+2 for safety/equipment General ADL Comments: Pt with improved activity tolerance this visit.     Vision       Perception     Praxis      Cognition Arousal/Alertness: Awake/alert Behavior During Therapy: WFL for tasks assessed/performed Overall Cognitive Status: Impaired/Different from baseline Area of Impairment: Following commands;Problem solving;Memory                     Memory: Decreased short-term memory Following Commands: Follows one step commands with increased time     Problem Solving: Slow processing;Difficulty sequencing;Requires verbal cues;Requires tactile cues General Comments: pt with mitts on hands due to pulling out NGT when confused        Exercises     Shoulder Instructions       General Comments      Pertinent Vitals/ Pain       Pain Assessment: Faces Faces Pain Scale: Hurts even more Pain Location: abdomen Pain Descriptors / Indicators: Grimacing;Guarding;Moaning Pain Intervention(s): Monitored during session;Repositioned  Home Living                                          Prior Functioning/Environment              Frequency  Min 2X/week        Progress Toward Goals  OT Goals(current goals can now be found in the care plan section)  Progress towards OT goals: Progressing toward goals  Acute Rehab OT Goals Patient  Stated Goal: to get stronger OT Goal Formulation: With patient Time For Goal Achievement: 08/05/18 Potential to Achieve Goals: Good  Plan Discharge plan needs to be updated    Co-evaluation    PT/OT/SLP Co-Evaluation/Treatment: Yes Reason for Co-Treatment: For patient/therapist safety          AM-PAC OT "6 Clicks" Daily Activity     Outcome Measure   Help from another person eating meals?: Total Help from another person taking care of personal grooming?: A Little Help from another person toileting, which includes using toliet, bedpan, or urinal?: Total Help from another person bathing (including washing, rinsing, drying)?: A Lot Help from another person to put on and taking off regular upper body clothing?: A Lot Help from another person to put on and taking off regular lower body clothing?: Total 6 Click Score: 10    End of Session Equipment Utilized During Treatment: Gait belt;Rolling walker  OT Visit Diagnosis: Unsteadiness on feet (R26.81);Other abnormalities of gait and mobility (R26.89);Pain   Activity Tolerance Patient tolerated treatment well   Patient Left in chair;with call bell/phone within reach;with chair alarm set;with family/visitor present   Nurse Communication Other (comment)(assisted with disconnecting NGT)        Time: 4097-3532 OT Time Calculation (min): 34 min  Charges: OT General Charges $OT Visit: 1 Visit OT Treatments $Self Care/Home Management : 8-22 mins  Nestor Lewandowsky, OTR/L Acute Rehabilitation Services Pager: [phone] Office: [phone]   Malka So 07/21/2018, 1:31 PM

## 2018-07-21 NOTE — Progress Notes (Addendum)
[facility] [place]  Subjective  - Patient is slightly confused.  He had pulled the NG tube out and is now in mittens to prevent this from happening again.  He states he doesn't remember doing this.  He can't recall getting out of bed or what he did yesterday.   Objective (!) 150/72 (!) 111 98.1 F (36.7 C) (Oral) 17 99%  Intake/Output Summary (Last 24 hours) at 07/21/2018 0735 Last data filed at 07/21/2018 0415 Gross per 24 hour  Intake 2142.71 ml  Output 3415 ml  Net -1272.29 ml   Feet warm well perfused Abdomin NTTP, distended with hypo BS Right groin incisional vac in place will be changed today by nursing  NG tube in place Herat tachy   Assessment/Planning: POD # 15 Aortobifem   Will call cardiology today for consult he has maintained a tachy rhythm and short stent of A fib.  He was placed on a diltiazem drip and his heart rate was in the 80-90's.  Once the drip was stopped his HR was tachy again 120-130's this am.  Pending CBC this am Maintain NG tube NPO IR/General surgery will follow Cholecystostomy catheter IV fluid 50 cc NS, TPN 80 ml/hr for nutrition  He needs to be mobilized.   UO 3,275 last 24 hours.  1.4 baseline     Christian Bailey 07/21/2018 7:35 AM --  Laboratory Lab Results: Recent Labs    07/19/18 0522 07/20/18 0610  WBC 22.1* 16.5*  HGB 8.2* 7.5*  HCT 25.0* 24.9*  PLT 567* 515*   BMET Recent Labs    07/20/18 0434 07/21/18 0300  NA 138 140  K 3.6 4.4  CL 109 112*  CO2 22 20*  GLUCOSE 214* 200*  BUN 31* 33*  CREATININE 1.47* 1.40*  CALCIUM 7.9* 8.3*    COAG Lab Results  Component Value Date   INR 1.25 07/06/2018   INR 1.04 07/01/2018   INR 1.04 09/06/2017   No results found for: PTT  More alert now.  Son at bedside Abd much less distended and less tender GI:  con't NG, consider d/c tomorrow, NPO F/E/N:  Continue TPN ID:  Continue Zosym Change right groin vac 3x week MUST get OOB  today WBC the same today  Christian Bailey

## 2018-07-21 NOTE — Progress Notes (Addendum)
Nutrition Follow Up  DOCUMENTATION CODES:   Not applicable  INTERVENTION:    TPN per pharmacy  NUTRITION DIAGNOSIS:   Inadequate oral intake related to (post-op ileus) as evidenced by NPO status, ongoing  GOAL:   Patient will meet greater than or equal to 90% of their needs, met  MONITOR:   Diet advancement, PO intake, Labs, Skin, Weight trends  ASSESSMENT:  78 y/o male PMHx DM2, HTN/HLD, CAD, PVD. Initially presented to hospital for revision of femoral-popliteal bypass graft, done 1/8. Post-op patient has been slow to progress. RD consulted for TPN initiation d/t prolonged diet intolerance/ileus.   1/14 Clear Liquid diet 1/16 HH/Carbohydrate Modified diet 1/18 NPO, TPN started via PICC  1/19 NGT placed for decompression  1/21 s/p perc chole drain by IR  Pt working with OT; having intermittent confusion. Vascular Surgery and Surgery notes reviewed. Noted plan is to removed NGT today.  Pt continues to be NPO on TPN support. TPN currently at goal rate of 80 ml/hr. Providing 2013 kcals, 121 gm protein.  Medications include Colace and IV ABX. Labs reviewed. BUN 33 (H).  CBG's [phone].  TPN is meeting 100% of estimated nutrition needs.  Diet Order:   Diet Order            Diet NPO time specified Except for: Sips with Meds  Diet effective now             EDUCATION NEEDS:  No education needs have been identified at this time  Skin:  Wound VAC to R groin  Last BM:  1/15  Height:   Ht Readings from Last 1 Encounters:  07/06/18 _0  (1.651 m)   Weight:   Wt Readings from Last 1 Encounters:  07/16/18 70.2 kg   Ideal Body Weight:  61.82 kg  BMI:  Body mass index is 25.75 kg/m.  Estimated Nutritional Needs:   Kcal:  2000-2200   Protein:  105-120 gm   Fluid:  2.0-2.2 L  Arthur Holms, RD, LDN Pager #: [phone] After-Hours Pager #: [phone]

## 2018-07-21 NOTE — Progress Notes (Signed)
Physical Therapy Treatment Patient Details Name: Christian Bailey MRN: [id number] DOB: 31-Jul-1940 Today's Date: 07/21/2018    History of Present Illness Pt s/p aortobifemoral bypass and redo of rt fem pop bypass on 07/06/18. Hospital course complicated by ileus, NGT place, pt receiving TPN. Percutaneous chole drain placed 1/21. PMH - rt reverse total shoulder, HTN, DM, OA, PAD, lower extremity bypass    PT Comments    Pt showing improvement in activity tolerance from session on 1/20, but overall has regressed during inpatient stay in regards to functional mobility. Requires min-mod assist for functional mobility. Displays poor dynamic balance, weakness, pain, and decreased activity tolerance. Ambulating 30 feet with walker and close chair follow. Has been having intermittent confusion. Based on above, updating d/c plan to SNF. Pt and pt wife in agreement with recommendation.     Follow Up Recommendations  SNF     Equipment Recommendations  Other (comment)(rollator)    Recommendations for Other Services       Precautions / Restrictions Precautions Precautions: Fall Precaution Comments: NGT, wound vac, chole drain Restrictions Weight Bearing Restrictions: No    Mobility  Bed Mobility Overal bed mobility: Needs Assistance Bed Mobility: Rolling;Sidelying to Sit Rolling: Mod assist Sidelying to sit: Mod assist       General bed mobility comments: step by step cues for log roll technique, moderate physical assist  Transfers Overall transfer level: Needs assistance Equipment used: Rolling walker (2 wheeled) Transfers: Sit to/from Stand Sit to Stand: Min assist Stand pivot transfers: Min assist       General transfer comment: increased time and effort today, cues for hand placement, shaky  Ambulation/Gait Ambulation/Gait assistance: Min assist Gait Distance (Feet): 30 Feet Assistive device: Rolling walker (2 wheeled) Gait Pattern/deviations: Step-through pattern;Decreased  stride length;Trunk flexed     General Gait Details: increased tremulousness, requiring min assist for balance and close chair follow. cues for maintaining all 4 walker points in contact with ground   Stairs             Wheelchair Mobility    Modified Rankin (Stroke Patients Only)       Balance Overall balance assessment: Needs assistance   Sitting balance-Leahy Scale: Fair Sitting balance - Comments: required several minutes with support and posterior lean prior to being able to sit independently   Standing balance support: Bilateral upper extremity supported Standing balance-Leahy Scale: Poor Standing balance comment: reliance on B UE and external support, initially with posterior LOB                            Cognition Arousal/Alertness: Awake/alert Behavior During Therapy: WFL for tasks assessed/performed Overall Cognitive Status: Impaired/Different from baseline Area of Impairment: Following commands;Problem solving;Memory                     Memory: Decreased short-term memory Following Commands: Follows one step commands with increased time     Problem Solving: Slow processing;Difficulty sequencing;Requires verbal cues;Requires tactile cues General Comments: pt with mitts on hands due to pulling out NGT when confused      Exercises      General Comments        Pertinent Vitals/Pain Pain Assessment: Faces Faces Pain Scale: Hurts even more Pain Location: abdomen Pain Descriptors / Indicators: Grimacing;Guarding;Moaning Pain Intervention(s): Monitored during session    Home Living  Prior Function            PT Goals (current goals can now be found in the care plan section) Acute Rehab PT Goals Patient Stated Goal: to get stronger Potential to Achieve Goals: Good Progress towards PT goals: Progressing toward goals    Frequency    Min 3X/week      PT Plan Discharge plan needs to be  updated    Co-evaluation PT/OT/SLP Co-Evaluation/Treatment: Yes Reason for Co-Treatment: For patient/therapist safety;To address functional/ADL transfers PT goals addressed during session: Mobility/safety with mobility        AM-PAC PT "6 Clicks" Mobility   Outcome Measure  Help needed turning from your back to your side while in a flat bed without using bedrails?: None Help needed moving from lying on your back to sitting on the side of a flat bed without using bedrails?: A Lot Help needed moving to and from a bed to a chair (including a wheelchair)?: A Little Help needed standing up from a chair using your arms (e.g., wheelchair or bedside chair)?: A Little Help needed to walk in hospital room?: A Little Help needed climbing 3-5 steps with a railing? : Total 6 Click Score: 16    End of Session   Activity Tolerance: Patient tolerated treatment well Patient left: in chair;with call bell/phone within reach;with chair alarm set Nurse Communication: Mobility status PT Visit Diagnosis: Other abnormalities of gait and mobility (R26.89)     Time: 8280-0349 PT Time Calculation (min) (ACUTE ONLY): 30 min  Charges:  $Therapeutic Activity: 8-22 mins                     Ellamae Sia, PT, DPT Acute Rehabilitation Services Pager [phone] Office [phone]    Willy Eddy 07/21/2018, 2:27 PM

## 2018-07-21 NOTE — Progress Notes (Signed)
Patient ID: Christian Bailey, male   DOB: 15-Sep-1940, 78 y.o.   MRN: [id number]    15 Days Post-Op  Subjective: Reports feeling better each day; some discomfort overnight but resolved this morning. Delirium noted. Passing flatus, denies bm  Objective: Vital signs in last 24 hours: Temp:  [98.1 F (36.7 C)-98.5 F (36.9 C)] 98.1 F (36.7 C) (01/23 0415) Pulse Rate:  [102-125] 111 (01/23 0513) Resp:  [12-21] 17 (01/23 0513) BP: (150-166)/(52-114) 150/72 (01/23 0513) SpO2:  [94 %-99 %] 99 % (01/23 0513) Last BM Date: 07/17/18  Intake/Output from previous day: 01/22 0701 - 01/23 0700 In: 2142.7 [P.O.:40; I.V.:1795.3; IV Piggyback:307.4] Out: 3415 [Urine:3275; Drains:140] Intake/Output this shift: No intake/output data recorded.  PE: Heart: irreg rate/rhythm Lungs: CTAB Abd: distended, minimal ttp in RUQ around drain site.  Perc chole drain in place in RUQ with thick bilious output in drain.NGT with essentially no output.  Lab Results:  Recent Labs    07/19/18 0522 07/20/18 0610  WBC 22.1* 16.5*  HGB 8.2* 7.5*  HCT 25.0* 24.9*  PLT 567* 515*   BMET Recent Labs    07/20/18 0434 07/21/18 0300  NA 138 140  K 3.6 4.4  CL 109 112*  CO2 22 20*  GLUCOSE 214* 200*  BUN 31* 33*  CREATININE 1.47* 1.40*  CALCIUM 7.9* 8.3*   PT/INR No results for input(s): LABPROT, INR in the last 72 hours. CMP     Component Value Date/Time   NA 140 07/21/2018 0300   K 4.4 07/21/2018 0300   CL 112 (H) 07/21/2018 0300   CO2 20 (L) 07/21/2018 0300   GLUCOSE 200 (H) 07/21/2018 0300   BUN 33 (H) 07/21/2018 0300   CREATININE 1.40 (H) 07/21/2018 0300   CREATININE 1.33 (H) 04/10/2016 0851   CALCIUM 8.3 (L) 07/21/2018 0300   PROT 5.7 (L) 07/21/2018 0300   ALBUMIN 1.8 (L) 07/21/2018 0300   AST 55 (H) 07/21/2018 0300   ALT 33 07/21/2018 0300   ALKPHOS 118 07/21/2018 0300   BILITOT 0.6 07/21/2018 0300   GFRNONAA 48 (L) 07/21/2018 0300   GFRAA 56 (L) 07/21/2018 0300   Lipase       Component Value Date/Time   LIPASE 37 07/20/2018 0434       Studies/Results: Nm Hepatobiliary Liver Func  Result Date: 07/19/2018 CLINICAL DATA:  78 y/o  M; right upper quadrant pain and tenderness. EXAM: NUCLEAR MEDICINE HEPATOBILIARY IMAGING TECHNIQUE: Sequential images of the abdomen were obtained out to 60 minutes following intravenous administration of radiopharmaceutical. RADIOPHARMACEUTICALS:  5.4 mCi Tc-2m  Choletec IV COMPARISON:  None. FINDINGS: Prompt uptake and biliary excretion of activity by the liver is seen. No gallbladder activity is visualized, consistent with obstruction of cystic duct. Biliary activity passes into small bowel, consistent with patent common bile duct. IMPRESSION: No gallbladder activity consistent with obstruction of cystic duct and acute cholecystitis. These results will be called to the ordering clinician or representative by the Radiologist Assistant, and communication documented in the PACS or zVision Dashboard. Electronically Signed   By: Kristine Garbe M.D.   On: 07/19/2018 15:07   Ir Perc Cholecystostomy  Result Date: 07/20/2018 CLINICAL DATA:  Acute cholecystitis EXAM: PERCUTANEOUS CHOLECYSTOSTOMY TUBE PLACEMENT WITH ULTRASOUND AND FLUOROSCOPIC GUIDANCE FLUOROSCOPY TIME:  0.9 minutes; 37 uGym2 DAP TECHNIQUE: The procedure, risks (including but not limited to bleeding, infection, organ damage ), benefits, and alternatives were explained to the patient. Questions regarding the procedure were encouraged and answered. The patient understands and consents to the  procedure. Survey ultrasound of the abdomen was performed and an appropriate skin entry site was identified. Skin site was marked, prepped with chlorhexidine, and draped in usual sterile fashion, and infiltrated locally with 1% lidocaine. Intravenous Fentanyl and Versed were administered as conscious sedation during continuous monitoring of the patient's level of consciousness and  physiological / cardiorespiratory status by the radiology RN, with a total moderate sedation time of 14 minutes. Under real-time ultrasound guidance, gallbladder was accessed using a transhepatic approach with a 21-gauge needle. Ultrasound image documentation was saved. Bile returned through the hub. Needle was exchanged over a 018 guidewire for transitional dilator which allowed placement of 035 J wire. Over this, a 10.2 French pigtail catheter was advanced and formed centrally in the gallbladder lumen. Small contrast injection confirmed appropriate position. Catheter secured externally with 0 Prolene suture and placed external drain bag. Patient tolerated the procedure well. COMPLICATIONS: COMPLICATIONS none IMPRESSION: 1. Technically successful percutaneous cholecystostomy tube placement with ultrasound and fluoroscopic guidance. Electronically Signed   By: Lucrezia Europe M.D.   On: 07/20/2018 08:02   Dg Abd Portable 1v  Result Date: 07/20/2018 CLINICAL DATA:  Check gastric catheter placement EXAM: PORTABLE ABDOMEN - 1 VIEW COMPARISON:  Film from earlier in the same day. FINDINGS: Scattered large and small bowel gas is noted. Gastric catheter is stable in appearance with the proximal side port within the distal esophagus. The tip is just beyond the gastroesophageal junction. No significant changes noted. If there has been interval advancement of the catheter films over the chest and neck should be obtained to assess for coiling within the pharynx. IMPRESSION: No change in nasogastric catheter position as described above. Electronically Signed   By: Inez Catalina M.D.   On: 07/20/2018 11:58   Dg Abd Portable 1v  Result Date: 07/20/2018 CLINICAL DATA:  Check gastric catheter placement EXAM: PORTABLE ABDOMEN - 1 VIEW COMPARISON:  07/18/2018 FINDINGS: Gastric catheter is noted with the tip in the stomach. Proximal side port lies in the distal esophagus. This could be advanced several cm. Cholecystostomy tube is  noted in the right upper quadrant. Scattered large and small bowel gas is noted. No obstructive changes are seen. Degenerative changes of the thoracolumbar spine are noted. IMPRESSION: Gastric catheter as described.  This could be advanced several cm. Electronically Signed   By: Inez Catalina M.D.   On: 07/20/2018 07:44   US Abdomen Limited Ruq  Result Date: 07/19/2018 CLINICAL DATA:  Abdominal pain. EXAM: ULTRASOUND ABDOMEN LIMITED RIGHT UPPER QUADRANT COMPARISON:  CT 07/18/2018. FINDINGS: Gallbladder: Sludge in the gallbladder. Gallbladder wall thickness 4.2 mm. Pericholecystic fluid noted. Negative Murphy sign. Common bile duct: Diameter: 5.8 mm Liver: 1.2 cm thinly septated cyst left hepatic lobe, most likely benign. Similar findings on prior exam. Within normal limits in parenchymal echogenicity. Portal vein is patent on color Doppler imaging with normal direction of blood flow towards the liver. Incidental note is made of right pleural effusion. IMPRESSION: 1. Large amount of sludge noted. Gallbladder wall is thickened at 4.2 mm. Pericholecystic fluid noted. Cholecystitis can not be excluded. 2. Incidental note is made of right pleural effusion. Electronically Signed   By: Marcello Moores  Register   On: 07/19/2018 10:52    Anti-infectives: Anti-infectives (From admission, onward)   Start     Dose/Rate Route Frequency Ordered Stop   07/20/18 2300  piperacillin-tazobactam (ZOSYN) IVPB 3.375 g     3.375 g 12.5 mL/hr over 240 Minutes Intravenous Every 8 hours 07/20/18 1525  07/19/18 1715  ceFAZolin (ANCEF) IVPB 2g/100 mL premix     2 g 200 mL/hr over 30 Minutes Intravenous  Once 07/19/18 1711 07/19/18 1755   07/19/18 1707  ceFAZolin (ANCEF) 2-4 GM/100ML-% IVPB    Note to Pharmacy:  Desiree Hane   : cabinet override      07/19/18 1707 07/20/18 0514   07/17/18 2000  metroNIDAZOLE (FLAGYL) IVPB 500 mg  Status:  Discontinued     500 mg 100 mL/hr over 60 Minutes Intravenous Every 6 hours 07/17/18  1956 07/17/18 1959   07/17/18 2000  piperacillin-tazobactam (ZOSYN) IVPB 3.375 g  Status:  Discontinued     3.375 g 12.5 mL/hr over 240 Minutes Intravenous Every 8 hours 07/17/18 1959 07/20/18 1525   07/06/18 2200  ceFAZolin (ANCEF) IVPB 2g/100 mL premix     2 g 200 mL/hr over 30 Minutes Intravenous Every 8 hours 07/06/18 1840 07/07/18 0548   07/06/18 0730  ceFAZolin (ANCEF) IVPB 2g/100 mL premix     2 g 200 mL/hr over 30 Minutes Intravenous To [facility] 07/05/18 1140 07/06/18 1443   07/06/18 0700  vancomycin (VANCOCIN) IVPB 1000 mg/200 mL premix     1,000 mg 200 mL/hr over 60 Minutes Intravenous To [facility] 07/05/18 1154 07/06/18 1006       Assessment/Plan Acalculous cholecystitis -s/p perc chole drain by IR on 1/21 -WBC down to 16K yesterday -cont abx therapy for gallbladder for now -will follow, will need drain injection in about 4-6 weeks and then decision can be made regarding needs for lap chole.  Given he has no gallstones, he may not need lap chole going forward.  Colonic ileus -still distended. -NGT with minimal output.  Ok to remove -Will give dulcolax supp FEN - NPO/TPN VTE - plavix/heparin ID - zosyn/flagyl Follow up - Dr. Dema Severin   LOS: 29 days   Sharon Mt. Dema Severin, M.D. [facility]

## 2018-07-21 NOTE — Progress Notes (Signed)
Referring Physician(s): Dr Orvan Falconer  Supervising Physician: Jacqulynn Cadet  Patient Status:  [facility] - In-pt  Chief Complaint:  Perc chole drain placed in IR 1/21  Subjective:  Better today Up in bed Less pain overall Less Nausea  Aortobifem 2 weeks ago    Allergies: Asa [aspirin]; Gadolinium derivatives; and Oxycodone-acetaminophen  Medications: Prior to Admission medications   Medication Sig Start Date End Date Taking? Authorizing Provider  amoxicillin (AMOXIL) 500 MG capsule Take 2 grams (4 tablets) by mouth 1 hour prior to procedure. Patient taking differently: Take 2,000 mg by mouth See admin instructions. Take 2000 mg by mouth 1 hour prior to dental procedure 06/01/18  Yes Serafina Mitchell, MD  clopidogrel (PLAVIX) 75 MG tablet Take 1 tablet (75 mg total) by mouth daily. 05/30/18  Yes Lorretta Harp, MD  Coenzyme Q10 (COQ10 PO) Take 1 capsule by mouth every evening.    Yes [provider]  glucosamine-chondroitin 500-400 MG tablet Take 1 tablet by mouth every evening.    Yes [provider]  lisinopril (PRINIVIL,ZESTRIL) 5 MG tablet Take 5 mg by mouth 2 (two) times daily.    Yes [provider]  metFORMIN (GLUCOPHAGE) 1000 MG tablet Take 1 tablet (1,000 mg total) by mouth 2 (two) times daily with a meal. 05/16/14  Yes Dunn, Dayna N, PA-C  metoprolol succinate (TOPROL-XL) 25 MG 24 hr tablet Take 25 mg by mouth 2 (two) times daily.    Yes [provider]  Omega-3 Fatty Acids (FISH OIL) 1200 MG CAPS Take 1,200 mg by mouth every evening.    Yes [provider]  pantoprazole (PROTONIX) 40 MG tablet Take 40 mg by mouth twice daily Patient taking differently: Take 40 mg by mouth 2 (two) times daily.  05/30/18  Yes Lorretta Harp, MD  rosuvastatin (CRESTOR) 20 MG tablet Take 20 mg by mouth every evening.    Yes [provider]     Vital Signs: BP (!) 150/72   Pulse (!) 111   Temp 98.1 F (36.7 C) (Oral)    Resp 17   Ht 5\' 5"  (1.651 m)   Wt 154 lb 12.2 oz (70.2 kg)   SpO2 99%   BMI 25.75 kg/m   Physical Exam Skin:    General: Skin is warm and dry.     Comments: Site is clean and dry NT no bleeding OP dark bile 175 cc 1/21- 145 cc 1/22 50 cc in bag  Neurological:     Mental Status: He is oriented to person, place, and time.     Imaging: Ct Abdomen Pelvis Wo Contrast  Result Date: 07/18/2018 CLINICAL DATA:  Abdominal distension, nausea and vomiting. Status post aortobifemoral bypass graft with leukocytosis. Increased weakness. EXAM: CT ABDOMEN AND PELVIS WITHOUT CONTRAST TECHNIQUE: Multidetector CT imaging of the abdomen and pelvis was performed following the standard protocol without IV contrast. COMPARISON:  07/16/2018. FINDINGS: Lower chest: Again demonstrated are moderate-sized bilateral pleural effusions, without significant change. Associated bilateral compressive lower lobe atelectasis is also unchanged. Minimal pericardial effusion with a maximum thickness of 11 mm. Hepatobiliary: Stable small medial segment left lobe liver cyst. Ill-defined low density in the right lobe of the liver due to streak artifacts produced by the patient's right arm. The gallbladder remains dilated with poorly defined margins and mild pericholecystic edema. Pancreas: Unremarkable. No pancreatic ductal dilatation or surrounding inflammatory changes. Spleen: Normal in size without focal abnormality. Adrenals/Urinary Tract: Normal appearing adrenal glands. Bilateral renal cysts. Tiny  mid right renal calculus. Air in the urinary bladder Stomach/Bowel: No significant change in gaseous distention of the transverse colon. The previously seen gas distended small bowel loops are normal in caliber. Decompressed stomach with a nasogastric tube in place. Mild diffuse gastric wall thickening, accentuated by the lack of distention. Vascular/Lymphatic: The previously demonstrated heterogeneous 3.2 x 1.7 cm fluid collection  adjacent to the right iliac portion of the aorta by femoral graft measures 4.6 x 2.9 cm on image number 65 series 3. The previously demonstrated 10.3 x 6.0 cm larger, similar-appearing collection on the left, measures 10.0 x 7.4 cm on image number 58 series 3. This currently measures 12.9 cm in length on coronal image number 84, previously 13.2 cm. A fluid collection adjacent to the proximal aortic portion of the graft measures 1.8 x 1.0 cm on image number 36 series 3, previously 2.0 x 1.2 cm. Atheromatous arterial calcifications. Reproductive: Mildly enlarged prostate gland containing coarse calcifications. Other: Small right and small to moderate-sized left inguinal hernias containing fat. Small amount of free peritoneal fluid. Bilateral subcutaneous edema. A small triangular-shaped fluid collection inferior to the pancreatic body/tail junction measures 2.4 x 1.7 cm on image number 34 and 6 Hounsfield units in density, without significant change. Musculoskeletal: Extensive lumbar and lower thoracic spine degenerative changes with changes of DISH. Mild bilateral hip degenerative changes. IMPRESSION: 1. Mild increase in size of a right pelvic retroperitoneal hematoma measuring 4.6 x 2.9 cm today. 2. No significant change in a large left pelvic retroperitoneal hematoma. 3. No significant change in moderate-sized bilateral pleural effusions and associated bilateral compressive lower lobe atelectasis. 4. Stable dilated gallbladder with poorly defined margins and mild pericholecystic edema. This could be due to cholecystitis. 5. Mild diffuse gastric wall thickening, accentuated by the lack of distention. 6. Mild increase in size of a small fluid collections adjacent to the aortic graft, as described above. 7. Stable small amount of free peritoneal fluid. Electronically Signed   By: Claudie Revering M.D.   On: 07/18/2018 15:12   Dg Abd 1 View  Result Date: 07/18/2018 CLINICAL DATA:  Enteric tube advanced into stomach  under fluoroscopy. EXAM: ABDOMEN - 1 VIEW; DG NASO G TUBE PLC W/FL-NO RAD COMPARISON:  07/17/2018 abdominal radiograph FINDINGS: Fluoroscopy time 1 minutes 7 seconds. Enteric tube tip advanced to the body of the stomach. IMPRESSION: Enteric tube is now well positioned with the tip in the body of the stomach after advancement under fluoroscopy. Electronically Signed   By: Ilona Sorrel M.D.   On: 07/18/2018 09:50   Nm Hepatobiliary Liver Func  Result Date: 07/19/2018 CLINICAL DATA:  78 y/o  M; right upper quadrant pain and tenderness. EXAM: NUCLEAR MEDICINE HEPATOBILIARY IMAGING TECHNIQUE: Sequential images of the abdomen were obtained out to 60 minutes following intravenous administration of radiopharmaceutical. RADIOPHARMACEUTICALS:  5.4 mCi Tc-43m  Choletec IV COMPARISON:  None. FINDINGS: Prompt uptake and biliary excretion of activity by the liver is seen. No gallbladder activity is visualized, consistent with obstruction of cystic duct. Biliary activity passes into small bowel, consistent with patent common bile duct. IMPRESSION: No gallbladder activity consistent with obstruction of cystic duct and acute cholecystitis. These results will be called to the ordering clinician or representative by the Radiologist Assistant, and communication documented in the PACS or zVision Dashboard. Electronically Signed   By: Kristine Garbe M.D.   On: 07/19/2018 15:07   Ir Perc Cholecystostomy  Result Date: 07/20/2018 CLINICAL DATA:  Acute cholecystitis EXAM: PERCUTANEOUS CHOLECYSTOSTOMY TUBE PLACEMENT WITH ULTRASOUND  AND FLUOROSCOPIC GUIDANCE FLUOROSCOPY TIME:  0.9 minutes; 59 uGym2 DAP TECHNIQUE: The procedure, risks (including but not limited to bleeding, infection, organ damage ), benefits, and alternatives were explained to the patient. Questions regarding the procedure were encouraged and answered. The patient understands and consents to the procedure. Survey ultrasound of the abdomen was performed and an  appropriate skin entry site was identified. Skin site was marked, prepped with chlorhexidine, and draped in usual sterile fashion, and infiltrated locally with 1% lidocaine. Intravenous Fentanyl and Versed were administered as conscious sedation during continuous monitoring of the patient's level of consciousness and physiological / cardiorespiratory status by the radiology RN, with a total moderate sedation time of 14 minutes. Under real-time ultrasound guidance, gallbladder was accessed using a transhepatic approach with a 21-gauge needle. Ultrasound image documentation was saved. Bile returned through the hub. Needle was exchanged over a 018 guidewire for transitional dilator which allowed placement of 035 J wire. Over this, a 10.2 French pigtail catheter was advanced and formed centrally in the gallbladder lumen. Small contrast injection confirmed appropriate position. Catheter secured externally with 0 Prolene suture and placed external drain bag. Patient tolerated the procedure well. COMPLICATIONS: COMPLICATIONS none IMPRESSION: 1. Technically successful percutaneous cholecystostomy tube placement with ultrasound and fluoroscopic guidance. Electronically Signed   By: Lucrezia Europe M.D.   On: 07/20/2018 08:02   Dg Abd Portable 1v  Result Date: 07/20/2018 CLINICAL DATA:  Check gastric catheter placement EXAM: PORTABLE ABDOMEN - 1 VIEW COMPARISON:  Film from earlier in the same day. FINDINGS: Scattered large and small bowel gas is noted. Gastric catheter is stable in appearance with the proximal side port within the distal esophagus. The tip is just beyond the gastroesophageal junction. No significant changes noted. If there has been interval advancement of the catheter films over the chest and neck should be obtained to assess for coiling within the pharynx. IMPRESSION: No change in nasogastric catheter position as described above. Electronically Signed   By: Inez Catalina M.D.   On: 07/20/2018 11:58   Dg Abd  Portable 1v  Result Date: 07/20/2018 CLINICAL DATA:  Check gastric catheter placement EXAM: PORTABLE ABDOMEN - 1 VIEW COMPARISON:  07/18/2018 FINDINGS: Gastric catheter is noted with the tip in the stomach. Proximal side port lies in the distal esophagus. This could be advanced several cm. Cholecystostomy tube is noted in the right upper quadrant. Scattered large and small bowel gas is noted. No obstructive changes are seen. Degenerative changes of the thoracolumbar spine are noted. IMPRESSION: Gastric catheter as described.  This could be advanced several cm. Electronically Signed   By: Inez Catalina M.D.   On: 07/20/2018 07:44   Dg Abd Portable 1v  Result Date: 07/17/2018 CLINICAL DATA:  NG tube placement EXAM: PORTABLE ABDOMEN - 1 VIEW COMPARISON:  None. FINDINGS: NG tube with tip in the gastric cardia. Side port above the GE junction. Consider advancing 6 to 8 cm such that the side port is below the GE junction. Gas-filled loops of bowel. IMPRESSION: NG tube tip in stomach.  Consider advancement as above. Electronically Signed   By: Suzy Bouchard M.D.   On: 07/17/2018 22:35   Dg Addison Bailey G Tube Plc W/fl-no Rad  Result Date: 07/18/2018 CLINICAL DATA:  Enteric tube advanced into stomach under fluoroscopy. EXAM: ABDOMEN - 1 VIEW; DG NASO G TUBE PLC W/FL-NO RAD COMPARISON:  07/17/2018 abdominal radiograph FINDINGS: Fluoroscopy time 1 minutes 7 seconds. Enteric tube tip advanced to the body of the stomach. IMPRESSION: Enteric  tube is now well positioned with the tip in the body of the stomach after advancement under fluoroscopy. Electronically Signed   By: Ilona Sorrel M.D.   On: 07/18/2018 09:50   US Abdomen Limited Ruq  Result Date: 07/19/2018 CLINICAL DATA:  Abdominal pain. EXAM: ULTRASOUND ABDOMEN LIMITED RIGHT UPPER QUADRANT COMPARISON:  CT 07/18/2018. FINDINGS: Gallbladder: Sludge in the gallbladder. Gallbladder wall thickness 4.2 mm. Pericholecystic fluid noted. Negative Murphy sign. Common bile  duct: Diameter: 5.8 mm Liver: 1.2 cm thinly septated cyst left hepatic lobe, most likely benign. Similar findings on prior exam. Within normal limits in parenchymal echogenicity. Portal vein is patent on color Doppler imaging with normal direction of blood flow towards the liver. Incidental note is made of right pleural effusion. IMPRESSION: 1. Large amount of sludge noted. Gallbladder wall is thickened at 4.2 mm. Pericholecystic fluid noted. Cholecystitis can not be excluded. 2. Incidental note is made of right pleural effusion. Electronically Signed   By: Marcello Moores  Register   On: 07/19/2018 10:52    Labs:  CBC: Recent Labs    07/18/18 0437 07/19/18 0522 07/20/18 0610 07/21/18 0917  WBC 27.0* 22.1* 16.5* 16.7*  HGB 7.5* 8.2* 7.5* 8.1*  HCT 24.9* 25.0* 24.9* 25.9*  PLT 478* 567* 515* 571*    COAGS: Recent Labs    09/06/17 0923 07/01/18 0926 07/06/18 1600  INR 1.04 1.04 1.25  APTT 31 30 27     BMP: Recent Labs    07/18/18 0437 07/19/18 0432 07/20/18 0434 07/21/18 0300  NA 135 134* 138 140  K 4.1 3.5 3.6 4.4  CL 101 103 109 112*  CO2 24 23 22  20*  GLUCOSE 135* 215* 214* 200*  BUN 30* 31* 31* 33*  CALCIUM 7.8* 8.0* 7.9* 8.3*  CREATININE 1.61* 1.55* 1.47* 1.40*  GFRNONAA 41* 43* 45* 48*  GFRAA 47* 49* 53* 56*    LIVER FUNCTION TESTS: Recent Labs    07/07/18 0711 07/17/18 0859 07/18/18 0437 07/21/18 0300  BILITOT 0.8 0.7 1.0 0.6  AST 23 19 31  55*  ALT 12 11 16  33  ALKPHOS 46 79 86 118  PROT 5.5* 5.2* 5.3* 5.7*  ALBUMIN 3.1* 2.1* 2.0* 1.8*    Assessment and Plan:  Cholecystitis Percutaneous cholecystostomy drain 1/21 in IR Will follow Will need drain 6 weeks or more IR to see in OP Clinic Orders in place  Electronically Signed: Lavonia Drafts, PA-C 07/21/2018, 2:05 PM   I spent a total of 25 Minutes at the the patient's bedside AND on the patient's hospital floor or unit, greater than 50% of which was counseling/coordinating care for perc chole  drain

## 2018-07-21 NOTE — Consult Note (Addendum)
Cardiology Consultation:   Patient ID: Christian Bailey MRN: [id number]; DOB: 1940/12/04  Admit date: 07/06/2018 Date of Consult: 07/21/2018  Primary Care Provider: Prince Solian, MD Primary Cardiologist: Quay Burow, MD  Primary Electrophysiologist:  None    Patient Profile:   Christian Bailey is a 78 y.o. male with a hx of PVOD s/p remote stenting of his left common iliac 20 years ago, carotid artery disease, tobacco abuse, HTN, HLD and diabetes, admitted for elective aortobifemoral bypass w/ post operative course complicated by cholecystitis and colonic ileus, s/p perc chole drain. Now with frequent atrial ectopy/ tachycardia, for which cardiology has been consulted, at the request of [name], Vascular Surgery.   History of Present Illness:   [name] has no prior h/o arrthmias. He has been followed by Dr. Gwenlyn Found for PVOD. He underwent left common iliac surgery over 20 years ago. Other medical issues include DM, HTN, HLD, chronic RBBB and tobacco use. He also reports a h/o bleeding gastric ulcer in the setting of ASA use, roughly 5 years ago. No recurrence since. He recently developed worsening claudication. LE dopplers with abnormal ABIs. Sent for repeat aortogram w/ LE run off and found to have occlusive disease and was referred to vascular surgery. Aortobifemoral bypass was recommended. Prior to surgery, Dr. Gwenlyn Found ordered a NST. Study was done 05/2018 and was negative for ischemia. EF normal by nuclear study and echo.   Pt presented for surgery on 07/06/18. Surgery uncomplicated. Several days later, he developed abdominal pain and distention.  W/u c/w acute cholecystitis. Also colonic ileus. Gen surgery consulted. He has NGT and also perc chole drain. On antibiotics. Over the last several days, he has had issues with tachycardia and frequent atrial ectopy. Initially felt to be afib, but upon our review of EKGs and tele, this appears to be NSR w/ frequent PACs and PVCs.   Today's labs are  notable for anemia. Hgb 8.1. No BMP obtained today, however his K levels have been WNL. Mild renal insuffiencey also noted. SCr was 1.47 yesterday.   He is currently NPO. He is scheduled to get IV metoprolol, Q6Hs. HR currently in the 90s. Denies CP, dyspnea and palpitations.   Past Medical History:  Diagnosis Date  . Anemia   . Complication of anesthesia   . Diabetes mellitus ([place])    TYPE 2  . GERD (gastroesophageal reflux disease)   . GI bleed   . History of blood transfusion    GI bleed  . History of colon polyps   . History of hiatal hernia   . Hyperlipemia   . Hypertension   . Osteoarthritis   . PAD (peripheral artery disease) ([facility])    a. stenting of his left common iliac artery >20 years ago. b. h/o LEIA stent and 2 stents to R SFA in 2011. c. 04/2014:  s/p PTA of right SFA for in-stent restenosis, occluded left SFA  . PONV (postoperative nausea and vomiting)    no porblem with the last 3 surgeries  . RBBB (right bundle branch block with left anterior fascicular block)    NUCLEAR STRESS TEST, 08/18/2010 - no significant wall motion abnoramlities noted, post-stress EF 69%, normal myocardial perfusion study  . Sinus tachycardia    a. Noted during admission 04/2014 but upon review seems to be frequent finding for patient.  . Stenosis of carotid artery    a. 50% right carotid stenosis by angiogram 04/2014.    Past Surgical History:  Procedure Laterality Date  . ABDOMINAL AORTOGRAM  W/LOWER EXTREMITY N/A 01/27/2017   Procedure: Abdominal Aortogram w/Lower Extremity;  Surgeon: Serafina Mitchell, MD;  Location: [place] [facility];  Service: Cardiovascular;  Laterality: N/A;  . ABDOMINAL AORTOGRAM W/LOWER EXTREMITY N/A 06/08/2017   Procedure: ABDOMINAL AORTOGRAM W/LOWER EXTREMITY;  Surgeon: Serafina Mitchell, MD;  Location: [place] [facility];  Service: Cardiovascular;  Laterality: N/A;  . ABDOMINAL AORTOGRAM W/LOWER EXTREMITY N/A 08/31/2017   Procedure: ABDOMINAL AORTOGRAM W/LOWER  EXTREMITY;  Surgeon: Serafina Mitchell, MD;  Location: [place] [facility];  Service: Cardiovascular;  Laterality: N/A;  rt. unilateral  . ANGIOPLASTY / STENTING FEMORAL    . ANGIOPLASTY / STENTING ILIAC    . AORTA - BILATERAL FEMORAL ARTERY BYPASS GRAFT N/A 07/06/2018   Procedure: AORTA BIFEMORAL BYPASS GRAFT USING 14X7MM X 40CM HEMASHIELD GOLD GRAFT;  Surgeon: Serafina Mitchell, MD;  Location: [place];  Service: Vascular;  Laterality: N/A;  . CEREBRAL ANGIOGRAM N/A 05/14/2014   Procedure: CEREBRAL ANGIOGRAM;  Surgeon: Lorretta Harp, MD;  Location: [facility] C[facility] LAB;  Service: Cardiovascular;  Laterality: N/A;  . COLONOSCOPY W/ POLYPECTOMY    . ENDARTERECTOMY Right 09/08/2017  . ENDARTERECTOMY FEMORAL Right 09/08/2017   Procedure: REDO RIGHT FEMORAL ENDARTECTOMY WITH PATCH ANGIOPLASTY.;  Surgeon: Serafina Mitchell, MD;  Location: [place];  Service: Vascular;  Laterality: Right;  . EYE SURGERY Bilateral    cataract  . FEMORAL ARTERY STENT Right 05/12/2010   Stented distally with a 6x100 Abbott absolute stent and proximally with a 6x60 Cook Zilver stent resulting in the reduction of the proximal segment 80% and mid segment 60-70% to 0% residual, LEFT common femoral artery stented with a 7x3 Smart stent resulting in reduction of 90% stenosis to 0% residual  . FEMORAL-POPLITEAL BYPASS GRAFT Right 09/18/2016   Procedure: BYPASS GRAFT FEMORAL-POPLITEAL ARTERY;  Surgeon: Serafina Mitchell, MD;  Location: [place];  Service: Vascular;  Laterality: Right;  . FEMORAL-POPLITEAL BYPASS GRAFT Right 09/08/2017   Procedure: REDO BYPASS GRAFT FEMORAL-POPLITEAL ARTERY;  Surgeon: Serafina Mitchell, MD;  Location: [place];  Service: Vascular;  Laterality: Right;  . FEMORAL-POPLITEAL BYPASS GRAFT Right 07/06/2018   Procedure: REVISION RIGHT FEMORAL TO POPLITEAL ARTERY BYPASS GRAFT;  Surgeon: Serafina Mitchell, MD;  Location: [place];  Service: Vascular;  Laterality: Right;  . IR PERC CHOLECYSTOSTOMY  07/19/2018  . KNEE ARTHROSCOPY     left    . LOWER EXTREMITY ANGIOGRAM N/A 05/14/2014   Procedure: LOWER EXTREMITY ANGIOGRAM;  Surgeon: Lorretta Harp, MD;  Location: [facility] CATH LAB;  Service: Cardiovascular;  Laterality: N/A;  . LOWER EXTREMITY ANGIOGRAPHY N/A 09/21/2016   Procedure: Lower Extremity Angiography;  Surgeon: Conrad Bellevue, MD;  Location: [place] [facility];  Service: Cardiovascular;  Laterality: N/A;  . PERIPHERAL VASCULAR ATHERECTOMY Right 01/27/2017   Procedure: PERIPHERAL VASCULAR ATHERECTOMY;  Surgeon: Serafina Mitchell, MD;  Location: [place] [facility];  Service: Cardiovascular;  Laterality: Right;  . PERIPHERAL VASCULAR BALLOON ANGIOPLASTY Right 06/08/2017   Procedure: PERIPHERAL VASCULAR BALLOON ANGIOPLASTY;  Surgeon: Serafina Mitchell, MD;  Location: [place] [facility];  Service: Cardiovascular;  Laterality: Right;  common femoral and superficial femoral arteries  . PERIPHERAL VASCULAR CATHETERIZATION N/A 04/20/2016   Procedure: Lower Extremity Intervention;  Surgeon: Lorretta Harp, MD;  Location: [place] [facility];  Service: Cardiovascular;  Laterality: N/A;  . PERIPHERAL VASCULAR INTERVENTION  08/31/2017   Procedure: PERIPHERAL VASCULAR INTERVENTION;  Surgeon: Serafina Mitchell, MD;  Location: [place] [facility];  Service:  Cardiovascular;;  REIA  . REVERSE SHOULDER ARTHROPLASTY Right 12/07/2016  . REVERSE SHOULDER ARTHROPLASTY Right 12/07/2016   Procedure: REVERSE SHOULDER ARTHROPLASTY;  Surgeon: Netta Cedars, MD;  Location: [place];  Service: Orthopedics;  Laterality: Right;  . ROTATOR CUFF REPAIR Right 2003  . SFA Right 05/14/2014   PTA  OF RT SFA         DR BERRY     Home Medications:  Prior to Admission medications   Medication Sig Start Date End Date Taking? Authorizing Provider  amoxicillin (AMOXIL) 500 MG capsule Take 2 grams (4 tablets) by mouth 1 hour prior to procedure. Patient taking differently: Take 2,000 mg by mouth See admin instructions. Take 2000 mg by mouth 1 hour prior to dental procedure 06/01/18   Yes Serafina Mitchell, MD  clopidogrel (PLAVIX) 75 MG tablet Take 1 tablet (75 mg total) by mouth daily. 05/30/18  Yes Lorretta Harp, MD  Coenzyme Q10 (COQ10 PO) Take 1 capsule by mouth every evening.    Yes [provider]  glucosamine-chondroitin 500-400 MG tablet Take 1 tablet by mouth every evening.    Yes [provider]  lisinopril (PRINIVIL,ZESTRIL) 5 MG tablet Take 5 mg by mouth 2 (two) times daily.    Yes [provider]  metFORMIN (GLUCOPHAGE) 1000 MG tablet Take 1 tablet (1,000 mg total) by mouth 2 (two) times daily with a meal. 05/16/14  Yes Dunn, Dayna N, PA-C  metoprolol succinate (TOPROL-XL) 25 MG 24 hr tablet Take 25 mg by mouth 2 (two) times daily.    Yes [provider]  Omega-3 Fatty Acids (FISH OIL) 1200 MG CAPS Take 1,200 mg by mouth every evening.    Yes [provider]  pantoprazole (PROTONIX) 40 MG tablet Take 40 mg by mouth twice daily Patient taking differently: Take 40 mg by mouth 2 (two) times daily.  05/30/18  Yes Lorretta Harp, MD  rosuvastatin (CRESTOR) 20 MG tablet Take 20 mg by mouth every evening.    Yes [provider]    Inpatient Medications: Scheduled Meds: . clopidogrel  75 mg Oral Daily  . docusate sodium  100 mg Oral Daily  . heparin  5,000 Units Subcutaneous Q8H  . insulin aspart  0-15 Units Subcutaneous Q4H  . mouth rinse  15 mL Mouth Rinse BID  . metoprolol tartrate  2.5 mg Intravenous Q6H  . mupirocin ointment   Nasal BID  . pantoprazole (PROTONIX) IV  40 mg Intravenous Daily  . rosuvastatin  20 mg Oral QPM  . sodium chloride flush  10-40 mL Intracatheter Q12H  . sodium chloride flush  5 mL Intracatheter Q8H  . traMADol  50 mg Oral Q6H   Continuous Infusions: . sodium chloride    . magnesium sulfate 1 - 4 g bolus IVPB    . piperacillin-tazobactam (ZOSYN)  IV 3.375 g (07/21/18 0630)  . TPN ADULT (ION) 80 mL/hr at 07/21/18 0415  . TPN ADULT (ION)     PRN Meds: sodium chloride,  acetaminophen **OR** acetaminophen, alum & mag hydroxide-simeth, bisacodyl, guaiFENesin-dextromethorphan, magnesium sulfate 1 - 4 g bolus IVPB, metoprolol tartrate, ondansetron, phenol, sodium chloride flush  Allergies:    Allergies  Allergen Reactions  . Asa [Aspirin] Other (See Comments)    GI bleeding  . Gadolinium Derivatives Other (See Comments)    Pt complained of face flushing/hottness and throat tightness/scratchiness immediately after the injections.  Within 4 minutes, all symptoms were gone and the study was completed.  No further complications  or signs of allergy were exhibited after completion of study.   . Oxycodone-Acetaminophen Other (See Comments)    Dizziness, uncomfortable     Social History:   Social History   Socioeconomic History  . Marital status: Married    Spouse name: Not on file  . Number of children: 2  . Years of education: Not on file  . Highest education level: Not on file  Occupational History  . Occupation: retired    Fish farm manager: RETIRED  Social Needs  . Financial resource strain: Not on file  . Food insecurity:    Worry: Not on file    Inability: Not on file  . Transportation needs:    Medical: Not on file    Non-medical: Not on file  Tobacco Use  . Smoking status: Former Smoker    Years: 25.00    Last attempt to quit: 06/1988    Years since quitting: 30.0  . Smokeless tobacco: Never Used  Substance and Sexual Activity  . Alcohol use: Yes    Comment: rarely  . Drug use: No  . Sexual activity: Not on file  Lifestyle  . Physical activity:    Days per week: Not on file    Minutes per session: Not on file  . Stress: Not on file  Relationships  . Social connections:    Talks on phone: Not on file    Gets together: Not on file    Attends religious service: Not on file    Active member of club or organization: Not on file    Attends meetings of clubs or organizations: Not on file    Relationship status: Not on file  . Intimate partner  violence:    Fear of current or ex partner: Not on file    Emotionally abused: Not on file    Physically abused: Not on file    Forced sexual activity: Not on file  Other Topics Concern  . Not on file  Social History Narrative  . Not on file    Family History:    Family History  Problem Relation Age of Onset  . Heart disease Mother   . Leukemia Mother   . Stomach cancer Father   . Esophageal cancer Brother   . Liver disease Brother   . Alcoholism Brother   . Leukemia Brother   . Leukemia Brother   . Diabetes type II Brother   . Lung disease Sister      ROS:  Please see the history of present illness.   All other ROS reviewed and negative.     Physical Exam/Data:   Vitals:   07/20/18 2005 07/20/18 2325 07/21/18 0415 07/21/18 0513  BP: (!) 164/65 (!) 165/81 (!) 161/114 (!) 150/72  Pulse: (!) 114 (!) 121 (!) 125 (!) 111  Resp: (!) 21 14 18 17   Temp: 98.2 F (36.8 C) 98.1 F (36.7 C) 98.1 F (36.7 C)   TempSrc: Oral Oral Oral   SpO2: 99% 98% 98% 99%  Weight:      Height:        Intake/Output Summary (Last 24 hours) at 07/21/2018 1324 Last data filed at 07/21/2018 1236 Gross per 24 hour  Intake 2137.71 ml  Output 2815 ml  Net -677.29 ml   Last 3 Weights 07/16/2018 07/08/2018 07/06/2018  Weight (lbs) 154 lb 12.2 oz 156 lb 15.5 oz 152 lb  Weight (kg) 70.2 kg 71.2 kg 68.947 kg     Body mass index is 25.75 kg/m.  General:  Elderly WM in no acute distress HEENT: normal, NTG present Lymph: no adenopathy Neck: no JVD Endocrine:  No thryomegaly Vascular: No carotid bruits; FA pulses 2+ bilaterally without bruits  Cardiac:  Irregular rhythm. Regular rate Lungs:  clear to auscultation bilaterally, no wheezing, rhonchi or rales  Abd: distened but + BS, mild RUQ tenderness Ext: no edema Musculoskeletal:  No deformities, BUE and BLE strength normal and equal Skin: warm and dry  Neuro:  CNs 2-12 intact, no focal abnormalities noted Psych:  Normal affect   EKG:  The  EKG was personally reviewed and demonstrates:  SR w/ PACs and PVCs Telemetry:  Telemetry was personally reviewed and demonstrates:   NSR w/ frequent PACs and PVCs.    Relevant CV Studies: 2D Echo 05/2911 Study Conclusions  - Procedure narrative: Transthoracic echocardiography. Image   quality was poor. The study was technically difficult.   Intravenous contrast (Definity) was administered to opacify the   LV. - Left ventricle: The cavity size was normal. There was mild focal   basal hypertrophy of the septum. Systolic function was normal.   The estimated ejection fraction was in the range of 60% to 65%.   Wall motion was normal; there were no regional wall motion   abnormalities. - Aortic valve: Transvalvular velocity was within the normal range.   There was no stenosis. There was no regurgitation. - Mitral valve: Transvalvular velocity was within the normal range.   There was no evidence for stenosis. There was trivial   regurgitation. - Right ventricle: The cavity size was normal. Wall thickness was   normal. Systolic function was normal. - Atrial septum: No defect or patent foramen ovale was identified. - Tricuspid valve: There was trivial regurgitation. - Pulmonary arteries: Systolic pressure was within the normal   range. PA peak pressure: 34 mm Hg (S).   NST 05/2018 Study Highlights     The left ventricular ejection fraction is normal (55-65%).  Nuclear stress EF: 57%.  There was no ST segment deviation noted during stress.  This is a low risk study.   1. Somewhat difficult study.  2. EF 57%, normal wall motion.  3. Primarily fixed medium-sized, mild apical and peri-apical perfusion defect.  Given normal wall motion, possible attenuation artifact.  No significant ischemia.    Overall, probably low risk study.      Laboratory Data:  Chemistry Recent Labs  Lab 07/19/18 0432 07/20/18 0434 07/21/18 0300  NA 134* 138 140  K 3.5 3.6 4.4  CL 103 109 112*   CO2 23 22 20*  GLUCOSE 215* 214* 200*  BUN 31* 31* 33*  CREATININE 1.55* 1.47* 1.40*  CALCIUM 8.0* 7.9* 8.3*  GFRNONAA 43* 45* 48*  GFRAA 49* 53* 56*  ANIONGAP 8 7 8     Recent Labs  Lab 07/17/18 0859 07/18/18 0437 07/21/18 0300  PROT 5.2* 5.3* 5.7*  ALBUMIN 2.1* 2.0* 1.8*  AST 19 31 55*  ALT 11 16 33  ALKPHOS 79 86 118  BILITOT 0.7 1.0 0.6   Hematology Recent Labs  Lab 07/19/18 0522 07/20/18 0610 07/21/18 0917  WBC 22.1* 16.5* 16.7*  RBC 2.70* 2.73* 2.83*  HGB 8.2* 7.5* 8.1*  HCT 25.0* 24.9* 25.9*  MCV 92.6 91.2 91.5  MCH 30.4 27.5 28.6  MCHC 32.8 30.1 31.3  RDW 15.1 15.1 15.3  PLT 567* 515* 571*   Cardiac Enzymes Recent Labs  Lab 07/19/18 0432  TROPONINI 0.03*   No results for input(s): TROPIPOC in the last  168 hours.  BNPNo results for input(s): BNP, PROBNP in the last 168 hours.  DDimer No results for input(s): DDIMER in the last 168 hours.  Radiology/Studies:  Ct Abdomen Pelvis Wo Contrast  Result Date: 07/18/2018 CLINICAL DATA:  Abdominal distension, nausea and vomiting. Status post aortobifemoral bypass graft with leukocytosis. Increased weakness. EXAM: CT ABDOMEN AND PELVIS WITHOUT CONTRAST TECHNIQUE: Multidetector CT imaging of the abdomen and pelvis was performed following the standard protocol without IV contrast. COMPARISON:  07/16/2018. FINDINGS: Lower chest: Again demonstrated are moderate-sized bilateral pleural effusions, without significant change. Associated bilateral compressive lower lobe atelectasis is also unchanged. Minimal pericardial effusion with a maximum thickness of 11 mm. Hepatobiliary: Stable small medial segment left lobe liver cyst. Ill-defined low density in the right lobe of the liver due to streak artifacts produced by the patient's right arm. The gallbladder remains dilated with poorly defined margins and mild pericholecystic edema. Pancreas: Unremarkable. No pancreatic ductal dilatation or surrounding inflammatory changes.  Spleen: Normal in size without focal abnormality. Adrenals/Urinary Tract: Normal appearing adrenal glands. Bilateral renal cysts. Tiny mid right renal calculus. Air in the urinary bladder Stomach/Bowel: No significant change in gaseous distention of the transverse colon. The previously seen gas distended small bowel loops are normal in caliber. Decompressed stomach with a nasogastric tube in place. Mild diffuse gastric wall thickening, accentuated by the lack of distention. Vascular/Lymphatic: The previously demonstrated heterogeneous 3.2 x 1.7 cm fluid collection adjacent to the right iliac portion of the aorta by femoral graft measures 4.6 x 2.9 cm on image number 65 series 3. The previously demonstrated 10.3 x 6.0 cm larger, similar-appearing collection on the left, measures 10.0 x 7.4 cm on image number 58 series 3. This currently measures 12.9 cm in length on coronal image number 84, previously 13.2 cm. A fluid collection adjacent to the proximal aortic portion of the graft measures 1.8 x 1.0 cm on image number 36 series 3, previously 2.0 x 1.2 cm. Atheromatous arterial calcifications. Reproductive: Mildly enlarged prostate gland containing coarse calcifications. Other: Small right and small to moderate-sized left inguinal hernias containing fat. Small amount of free peritoneal fluid. Bilateral subcutaneous edema. A small triangular-shaped fluid collection inferior to the pancreatic body/tail junction measures 2.4 x 1.7 cm on image number 34 and 6 Hounsfield units in density, without significant change. Musculoskeletal: Extensive lumbar and lower thoracic spine degenerative changes with changes of DISH. Mild bilateral hip degenerative changes. IMPRESSION: 1. Mild increase in size of a right pelvic retroperitoneal hematoma measuring 4.6 x 2.9 cm today. 2. No significant change in a large left pelvic retroperitoneal hematoma. 3. No significant change in moderate-sized bilateral pleural effusions and associated  bilateral compressive lower lobe atelectasis. 4. Stable dilated gallbladder with poorly defined margins and mild pericholecystic edema. This could be due to cholecystitis. 5. Mild diffuse gastric wall thickening, accentuated by the lack of distention. 6. Mild increase in size of a small fluid collections adjacent to the aortic graft, as described above. 7. Stable small amount of free peritoneal fluid. Electronically Signed   By: Claudie Revering M.D.   On: 07/18/2018 15:12   Dg Abd 1 View  Result Date: 07/18/2018 CLINICAL DATA:  Enteric tube advanced into stomach under fluoroscopy. EXAM: ABDOMEN - 1 VIEW; DG NASO G TUBE PLC W/FL-NO RAD COMPARISON:  07/17/2018 abdominal radiograph FINDINGS: Fluoroscopy time 1 minutes 7 seconds. Enteric tube tip advanced to the body of the stomach. IMPRESSION: Enteric tube is now well positioned with the tip in the body of the  stomach after advancement under fluoroscopy. Electronically Signed   By: Ilona Sorrel M.D.   On: 07/18/2018 09:50   Nm Hepatobiliary Liver Func  Result Date: 07/19/2018 CLINICAL DATA:  78 y/o  M; right upper quadrant pain and tenderness. EXAM: NUCLEAR MEDICINE HEPATOBILIARY IMAGING TECHNIQUE: Sequential images of the abdomen were obtained out to 60 minutes following intravenous administration of radiopharmaceutical. RADIOPHARMACEUTICALS:  5.4 mCi Tc-34m  Choletec IV COMPARISON:  None. FINDINGS: Prompt uptake and biliary excretion of activity by the liver is seen. No gallbladder activity is visualized, consistent with obstruction of cystic duct. Biliary activity passes into small bowel, consistent with patent common bile duct. IMPRESSION: No gallbladder activity consistent with obstruction of cystic duct and acute cholecystitis. These results will be called to the ordering clinician or representative by the Radiologist Assistant, and communication documented in the PACS or zVision Dashboard. Electronically Signed   By: Kristine Garbe M.D.   On:  07/19/2018 15:07   Ir Perc Cholecystostomy  Result Date: 07/20/2018 CLINICAL DATA:  Acute cholecystitis EXAM: PERCUTANEOUS CHOLECYSTOSTOMY TUBE PLACEMENT WITH ULTRASOUND AND FLUOROSCOPIC GUIDANCE FLUOROSCOPY TIME:  0.9 minutes; 21 uGym2 DAP TECHNIQUE: The procedure, risks (including but not limited to bleeding, infection, organ damage ), benefits, and alternatives were explained to the patient. Questions regarding the procedure were encouraged and answered. The patient understands and consents to the procedure. Survey ultrasound of the abdomen was performed and an appropriate skin entry site was identified. Skin site was marked, prepped with chlorhexidine, and draped in usual sterile fashion, and infiltrated locally with 1% lidocaine. Intravenous Fentanyl and Versed were administered as conscious sedation during continuous monitoring of the patient's level of consciousness and physiological / cardiorespiratory status by the radiology RN, with a total moderate sedation time of 14 minutes. Under real-time ultrasound guidance, gallbladder was accessed using a transhepatic approach with a 21-gauge needle. Ultrasound image documentation was saved. Bile returned through the hub. Needle was exchanged over a 018 guidewire for transitional dilator which allowed placement of 035 J wire. Over this, a 10.2 French pigtail catheter was advanced and formed centrally in the gallbladder lumen. Small contrast injection confirmed appropriate position. Catheter secured externally with 0 Prolene suture and placed external drain bag. Patient tolerated the procedure well. COMPLICATIONS: COMPLICATIONS none IMPRESSION: 1. Technically successful percutaneous cholecystostomy tube placement with ultrasound and fluoroscopic guidance. Electronically Signed   By: Lucrezia Europe M.D.   On: 07/20/2018 08:02   Dg Abd Portable 1v  Result Date: 07/20/2018 CLINICAL DATA:  Check gastric catheter placement EXAM: PORTABLE ABDOMEN - 1 VIEW COMPARISON:   Film from earlier in the same day. FINDINGS: Scattered large and small bowel gas is noted. Gastric catheter is stable in appearance with the proximal side port within the distal esophagus. The tip is just beyond the gastroesophageal junction. No significant changes noted. If there has been interval advancement of the catheter films over the chest and neck should be obtained to assess for coiling within the pharynx. IMPRESSION: No change in nasogastric catheter position as described above. Electronically Signed   By: Inez Catalina M.D.   On: 07/20/2018 11:58   Dg Abd Portable 1v  Result Date: 07/20/2018 CLINICAL DATA:  Check gastric catheter placement EXAM: PORTABLE ABDOMEN - 1 VIEW COMPARISON:  07/18/2018 FINDINGS: Gastric catheter is noted with the tip in the stomach. Proximal side port lies in the distal esophagus. This could be advanced several cm. Cholecystostomy tube is noted in the right upper quadrant. Scattered large and small bowel gas is noted.  No obstructive changes are seen. Degenerative changes of the thoracolumbar spine are noted. IMPRESSION: Gastric catheter as described.  This could be advanced several cm. Electronically Signed   By: Inez Catalina M.D.   On: 07/20/2018 07:44   Dg Abd Portable 1v  Result Date: 07/17/2018 CLINICAL DATA:  NG tube placement EXAM: PORTABLE ABDOMEN - 1 VIEW COMPARISON:  None. FINDINGS: NG tube with tip in the gastric cardia. Side port above the GE junction. Consider advancing 6 to 8 cm such that the side port is below the GE junction. Gas-filled loops of bowel. IMPRESSION: NG tube tip in stomach.  Consider advancement as above. Electronically Signed   By: Suzy Bouchard M.D.   On: 07/17/2018 22:35   Dg Addison Bailey G Tube Plc W/fl-no Rad  Result Date: 07/18/2018 CLINICAL DATA:  Enteric tube advanced into stomach under fluoroscopy. EXAM: ABDOMEN - 1 VIEW; DG NASO G TUBE PLC W/FL-NO RAD COMPARISON:  07/17/2018 abdominal radiograph FINDINGS: Fluoroscopy time 1 minutes 7  seconds. Enteric tube tip advanced to the body of the stomach. IMPRESSION: Enteric tube is now well positioned with the tip in the body of the stomach after advancement under fluoroscopy. Electronically Signed   By: Ilona Sorrel M.D.   On: 07/18/2018 09:50   US Abdomen Limited Ruq  Result Date: 07/19/2018 CLINICAL DATA:  Abdominal pain. EXAM: ULTRASOUND ABDOMEN LIMITED RIGHT UPPER QUADRANT COMPARISON:  CT 07/18/2018. FINDINGS: Gallbladder: Sludge in the gallbladder. Gallbladder wall thickness 4.2 mm. Pericholecystic fluid noted. Negative Murphy sign. Common bile duct: Diameter: 5.8 mm Liver: 1.2 cm thinly septated cyst left hepatic lobe, most likely benign. Similar findings on prior exam. Within normal limits in parenchymal echogenicity. Portal vein is patent on color Doppler imaging with normal direction of blood flow towards the liver. Incidental note is made of right pleural effusion. IMPRESSION: 1. Large amount of sludge noted. Gallbladder wall is thickened at 4.2 mm. Pericholecystic fluid noted. Cholecystitis can not be excluded. 2. Incidental note is made of right pleural effusion. Electronically Signed   By: Marcello Moores  Register   On: 07/19/2018 10:52    Assessment and Plan:   Angeldejesus Callaham is a 78 y.o. male with a hx of PVOD s/p remote stenting of his left common iliac 20 years ago, carotid artery disease, tobacco abuse, HTN, HLD and diabetes, admitted for elective aortobifemoral bypass w/ post operative course complicated by cholecystitis and colonic ileus, s/p perc chole drain. Now with frequent atrial ectopy/ tachycardia, for which cardiology has been consulted, at the request of [name], Vascular Surgery.   1. Ectopy:  Initially felt to be afib, but upon our review of EKGs and tele, this appears to be NSR w/ frequent PACs and PVCs. This is in the setting of recent surgery w/ post operative anemia as well as acute GI illness w/ cholecystitis and colonic ileus. Hgb is 8 today. Gen surgery is  managing GI issues. He is NPO w/ NGT and s/p perc chole drain and on antibiotics. K has been WNL. Recent echo showed normal LVEF. Recommend continued post op care by vascular surgery and management of GI problems by general surgery + further observation on tele. Given NPO status, recommend continuation of scheduled IV metoprolol. Once able to tolerate POs, transition back to home PO metoprolol dose and titrate further if needed. Given no evidence of atrial fibrillation, there is no indication for anticoagulation at this time.     For questions or updates, please contact [place] Please consult [email or web address] for contact info  under     Signed, Lyda Jester, PA-C  07/21/2018 1:24 PM   History and all data above reviewed.  Patient examined.  I agree with the findings as above.  The patient has had a complicated hospitalization as above.  He has tachycardia on his monitor thought to be atrial fib.  He does not feel palpitations typically.  Prior to this admission he was not getting chest pain, presyncope, syncope or SOB.    The patient exam reveals COR:RRR  ,  Lungs: Clear  ,  Abd: Positive bowel sounds, no rebound no guarding, Ext No edema  .  All available labs, radiology testing, previous records reviewed. Agree with documented assessment and plan.   Arrhythmia:  I have reviewed tele and repeated an EKG and I do not see atrial fib.  He has sinus tach with multifocal atrial tach/PACs.  No change in therapy at this time.  He is not yet taking POs.  Continue IV beta blocker and we can titrate his oral when he is taking PO. No need to start anticoagulation or change IV therapy.   Jeneen Rinks Madiha Bambrick  1:58 PM  07/21/2018

## 2018-07-21 NOTE — Progress Notes (Signed)
PHARMACY - ADULT TOTAL PARENTERAL NUTRITION CONSULT NOTE   Pharmacy Consult:  TPN Indication: Prolonged ileus  Patient Measurements: Height: 5\' 5"  (165.1 cm) Weight: 154 lb 12.2 oz (70.2 kg) IBW/kg (Calculated) : 61.5 TPN AdjBW (KG): 68.9 Body mass index is 25.75 kg/m. Usual Weight: 68.9 kg  Assessment:  50 YOM admitted with recurrent claudication s/p aortobifemoral bypass graft and revised right fem-pop bypass graft on 07/06/18. Clears initiated 1/14 and diet was advanced on 1/16 but patient has had poor intake (0-25% recorded). On 1/17, patient began having RUQ abdominal pain with nausea and sluggish bowel sounds. Symptoms continued to worsen POD#10 (1/18) including increased nausea with sensation to vomit and increased abdominal distension. Patient was receiving dextrose in IVF during this time. Patient was made NPO and pharmacy was consulted to manage TPN for prolonged ileus.   GI: prealbumin <5, amylase 118, lipase WNL, LBM 1/15, +flatus, right groin drainage down 121mL, no NG O/P recorded.  PPI IV, docusate.  1/20 CT - right pelvic RP hematoma bigger 1/21 HIDA positive for cholecystitis >> perc tube placed Endo: hx DM on metformin PTA - CBGs elevated Insulin requirements in the past 24 hours: 27 units + 27 units in TPN Lytes: high CL and low CO2, others WNL Renal: AKI - SCr down 1.4 (BL SCr 1.3-1.5), BUN 33 - UOP 1.9 ml/kghr, NS at 50/hr, net +1.4L Pulm: stable on RA Cards: chronic RBBB, CAD, PAD, HTN, HLD - BP elevated, tachy - Plavix, IV metoprolol, Crestor, diltiazem gtt, PRN hydralazine/metoprolol Hepatobil: LFTs / tbili / TG WNL. Neuro: tramadol, PRN Versed - pain controlled ID: Zosyn (1/19 >> ) for IAI - afebrile, WBC down to 16.5, no micro TPN Access: PICC 07/16/18 TPN start date: 07/16/18   Nutritional Goals (per RD rec on 1/18): 2000-2200 kCal, 105-125g protein, 2-2.2L fluid per day  Current Nutrition:  TPN  Plan:  Continue TPN at goal rate of 80 ml/hr, providing  121g AA, 269g CHO and 62g ILE for a total of 2013 kCal, meeting 100% of patient's needs  Electrolytes in TPN: incr Phos, change Cl:Ac 1:1 Daily multivitamin and trace elements in TPN Continue moderate SSI Q4H + increase regular insulin in TPN to 40 units NS at 50 ml/hr per MD F/U AM labs, CBGs   Kristeen Lantz D. Mina Marble, PharmD, BCPS, [place] 07/21/2018, 7:40 AM

## 2018-07-22 ENCOUNTER — Inpatient Hospital Stay (HOSPITAL_COMMUNITY): Payer: Medicare Other

## 2018-07-22 LAB — CBC
HCT: 25.7 % — ABNORMAL LOW (ref 39.0–52.0)
Hemoglobin: 8.2 g/dL — ABNORMAL LOW (ref 13.0–17.0)
MCH: 29.2 pg (ref 26.0–34.0)
MCHC: 31.9 g/dL (ref 30.0–36.0)
MCV: 91.5 fL (ref 80.0–100.0)
NRBC: 0 % (ref 0.0–0.2)
Platelets: 572 10*3/uL — ABNORMAL HIGH (ref 150–400)
RBC: 2.81 MIL/uL — ABNORMAL LOW (ref 4.22–5.81)
RDW: 15.2 % (ref 11.5–15.5)
WBC: 14.9 10*3/uL — ABNORMAL HIGH (ref 4.0–10.5)

## 2018-07-22 LAB — GLUCOSE, CAPILLARY
GLUCOSE-CAPILLARY: 208 mg/dL — AB (ref 70–99)
GLUCOSE-CAPILLARY: 215 mg/dL — AB (ref 70–99)
Glucose-Capillary: 111 mg/dL — ABNORMAL HIGH (ref 70–99)
Glucose-Capillary: 207 mg/dL — ABNORMAL HIGH (ref 70–99)
Glucose-Capillary: 216 mg/dL — ABNORMAL HIGH (ref 70–99)
Glucose-Capillary: 223 mg/dL — ABNORMAL HIGH (ref 70–99)

## 2018-07-22 LAB — BASIC METABOLIC PANEL
ANION GAP: 6 (ref 5–15)
BUN: 39 mg/dL — ABNORMAL HIGH (ref 8–23)
CO2: 22 mmol/L (ref 22–32)
Calcium: 8.4 mg/dL — ABNORMAL LOW (ref 8.9–10.3)
Chloride: 115 mmol/L — ABNORMAL HIGH (ref 98–111)
Creatinine, Ser: 1.53 mg/dL — ABNORMAL HIGH (ref 0.61–1.24)
GFR calc Af Amer: 50 mL/min — ABNORMAL LOW (ref >60)
GFR calc non Af Amer: 43 mL/min — ABNORMAL LOW (ref >60)
Glucose, Bld: 221 mg/dL — ABNORMAL HIGH (ref 70–99)
Potassium: 4.3 mmol/L (ref 3.5–5.1)
Sodium: 143 mmol/L (ref 135–145)

## 2018-07-22 MED ORDER — TRAVASOL 10 % IV SOLN
INTRAVENOUS | Status: AC
  Administered 2018-07-22: 18:00:00 via INTRAVENOUS
  Filled 2018-07-22: qty 1209.6

## 2018-07-22 NOTE — Progress Notes (Signed)
PHARMACY - ADULT TOTAL PARENTERAL NUTRITION CONSULT NOTE   Pharmacy Consult:  TPN Indication: Prolonged ileus  Patient Measurements: Height: 5\' 5"  (165.1 cm) Weight: 154 lb 12.2 oz (70.2 kg) IBW/kg (Calculated) : 61.5 TPN AdjBW (KG): 68.9 Body mass index is 25.75 kg/m. Usual Weight: 68.9 kg  Assessment:  73 YOM admitted with recurrent claudication s/p aortobifemoral bypass graft and revised right fem-pop bypass graft on 07/06/18. Clears initiated 1/14 and diet was advanced on 1/16 but patient has had poor intake (0-25% recorded). On 1/17, patient began having RUQ abdominal pain with nausea and sluggish bowel sounds. Symptoms continued to worsen POD#10 (1/18) including increased nausea with sensation to vomit and increased abdominal distension. Patient was receiving dextrose in IVF during this time. Patient was made NPO and pharmacy was consulted to manage TPN for prolonged ileus.   1/24 -still distended. -Removing NGT due to minimal output. -Will get GI consult to evaluate colonic ileus for further intervention such as decompressing flex sig, neostigmine, etc.   GI: prealbumin <5, amylase 118, lipase WNL, LBM 1/15, +flatus, right groin drainage down 157mL, no NG O/P recorded.  PPI IV, docusate.  1/20 CT - right pelvic RP hematoma bigger 1/21 HIDA positive for cholecystitis >> perc tube placed Endo: hx DM on metformin PTA - CBGs elevated Insulin requirements in the past 24 hours: 18 units + 40 units in TPN Lytes: high CL and CO2 WNL, others WNL Renal: AKI - SCr 1.53 (BL SCr 1.3-1.5), BUN 39 - UOP 2.3 ml/kghr Pulm: stable on RA Cards: chronic RBBB, CAD, PAD, HTN, HLD - BP elevated, tachy - Plavix, IV metoprolol, Crestor, diltiazem gtt, PRN hydralazine/metoprolol Hepatobil: LFTs / tbili / TG WNL. Neuro: tramadol, PRN Versed - pain controlled ID: Zosyn (1/19 >> ) for IAI - afebrile, WBC down to 14.9, no micro TPN Access: PICC 07/16/18 TPN start date: 07/16/18   Nutritional Goals (per RD  rec on 1/18): 2000-2200 kCal, 105-125g protein, 2-2.2L fluid per day  Current Nutrition:  TPN  Plan:  Continue TPN at goal rate of 80 ml/hr, providing 121g AA, 269g CHO and 62g ILE for a total of 2013 kCal, meeting 100% of patient's needs  Electrolytes in TPN: continue same Daily multivitamin and trace elements in TPN Continue moderate SSI Q4H + continue regular insulin in TPN to 40 units F/U AM labs, CBGs   Alanda Slim, PharmD, [place] Pharmacist Please see AMION for all Pharmacists' Contact Phone Numbers 07/22/2018, 7:53 AM

## 2018-07-22 NOTE — Consult Note (Signed)
   [facility] Inpatient Consult   07/22/2018  Christian Bailey 1941-05-29 [id number]   [place] Management hospital liaison follow up due to unplanned readmission risk score of 22% (high).   Spoke with inpatient RNCM. Disposition plans are likely for SNF. Discussed with inpatient RNCM, if disposition plans change, please place [facility] Care Management consult.   Marthenia Rolling, MSN-Ed, RN,BSN [facility] Liaison [phone]

## 2018-07-22 NOTE — Progress Notes (Signed)
16 Days Post-Op  Subjective: Patient sleeping but awakens and states he is having some abdominal discomfort secondary to distention, but no further RUQ abdominal pain since drain placement.  Objective: Vital signs in last 24 hours: Temp:  [97.6 F (36.4 C)-98 F (36.7 C)] 97.6 F (36.4 C) (01/24 0408) Pulse Rate:  [97-119] 113 (01/24 0408) Resp:  [10-28] 28 (01/24 0408) BP: (148-163)/(79-93) 148/79 (01/24 0408) SpO2:  [98 %-100 %] 98 % (01/24 0408) Last BM Date: 07/21/18  Intake/Output from previous day: 01/23 0701 - 01/24 0700 In: 1080.7 [I.V.:850.7; IV Piggyback:150] Out: 3800 [Urine:3800] Intake/Output this shift: No intake/output data recorded.  PE: Heart: tachy Lungs: CTAB, but diminished some Abd: distended, hypoactive BS, perc chole drain in place with bilious output  Lab Results:  Recent Labs    07/21/18 0917 07/22/18 0340  WBC 16.7* 14.9*  HGB 8.1* 8.2*  HCT 25.9* 25.7*  PLT 571* 572*   BMET Recent Labs    07/21/18 0300 07/22/18 0340  NA 140 143  K 4.4 4.3  CL 112* 115*  CO2 20* 22  GLUCOSE 200* 221*  BUN 33* 39*  CREATININE 1.40* 1.53*  CALCIUM 8.3* 8.4*   PT/INR No results for input(s): LABPROT, INR in the last 72 hours. CMP     Component Value Date/Time   NA 143 07/22/2018 0340   K 4.3 07/22/2018 0340   CL 115 (H) 07/22/2018 0340   CO2 22 07/22/2018 0340   GLUCOSE 221 (H) 07/22/2018 0340   BUN 39 (H) 07/22/2018 0340   CREATININE 1.53 (H) 07/22/2018 0340   CREATININE 1.33 (H) 04/10/2016 0851   CALCIUM 8.4 (L) 07/22/2018 0340   PROT 5.7 (L) 07/21/2018 0300   ALBUMIN 1.8 (L) 07/21/2018 0300   AST 55 (H) 07/21/2018 0300   ALT 33 07/21/2018 0300   ALKPHOS 118 07/21/2018 0300   BILITOT 0.6 07/21/2018 0300   GFRNONAA 43 (L) 07/22/2018 0340   GFRAA 50 (L) 07/22/2018 0340   Lipase     Component Value Date/Time   LIPASE 37 07/20/2018 0434       Studies/Results: Dg Abd Portable 1v  Result Date: 07/20/2018 CLINICAL DATA:   Check gastric catheter placement EXAM: PORTABLE ABDOMEN - 1 VIEW COMPARISON:  Film from earlier in the same day. FINDINGS: Scattered large and small bowel gas is noted. Gastric catheter is stable in appearance with the proximal side port within the distal esophagus. The tip is just beyond the gastroesophageal junction. No significant changes noted. If there has been interval advancement of the catheter films over the chest and neck should be obtained to assess for coiling within the pharynx. IMPRESSION: No change in nasogastric catheter position as described above. Electronically Signed   By: Inez Catalina M.D.   On: 07/20/2018 11:58   Dg Abd Portable 1v  Result Date: 07/20/2018 CLINICAL DATA:  Check gastric catheter placement EXAM: PORTABLE ABDOMEN - 1 VIEW COMPARISON:  07/18/2018 FINDINGS: Gastric catheter is noted with the tip in the stomach. Proximal side port lies in the distal esophagus. This could be advanced several cm. Cholecystostomy tube is noted in the right upper quadrant. Scattered large and small bowel gas is noted. No obstructive changes are seen. Degenerative changes of the thoracolumbar spine are noted. IMPRESSION: Gastric catheter as described.  This could be advanced several cm. Electronically Signed   By: Inez Catalina M.D.   On: 07/20/2018 07:44    Anti-infectives: Anti-infectives (From admission, onward)   Start  Dose/Rate Route Frequency Ordered Stop   07/20/18 2300  piperacillin-tazobactam (ZOSYN) IVPB 3.375 g     3.375 g 12.5 mL/hr over 240 Minutes Intravenous Every 8 hours 07/20/18 1525     07/19/18 1715  ceFAZolin (ANCEF) IVPB 2g/100 mL premix     2 g 200 mL/hr over 30 Minutes Intravenous  Once 07/19/18 1711 07/19/18 1755   07/19/18 1707  ceFAZolin (ANCEF) 2-4 GM/100ML-% IVPB    Note to Pharmacy:  Desiree Hane   : cabinet override      07/19/18 1707 07/20/18 0514   07/17/18 2000  metroNIDAZOLE (FLAGYL) IVPB 500 mg  Status:  Discontinued     500 mg 100 mL/hr over  60 Minutes Intravenous Every 6 hours 07/17/18 1956 07/17/18 1959   07/17/18 2000  piperacillin-tazobactam (ZOSYN) IVPB 3.375 g  Status:  Discontinued     3.375 g 12.5 mL/hr over 240 Minutes Intravenous Every 8 hours 07/17/18 1959 07/20/18 1525   07/06/18 2200  ceFAZolin (ANCEF) IVPB 2g/100 mL premix     2 g 200 mL/hr over 30 Minutes Intravenous Every 8 hours 07/06/18 1840 07/07/18 0548   07/06/18 0730  ceFAZolin (ANCEF) IVPB 2g/100 mL premix     2 g 200 mL/hr over 30 Minutes Intravenous To [facility] 07/05/18 1140 07/06/18 1443   07/06/18 0700  vancomycin (VANCOCIN) IVPB 1000 mg/200 mL premix     1,000 mg 200 mL/hr over 60 Minutes Intravenous To [facility] 07/05/18 1154 07/06/18 1006       Assessment/Plan Acalculous cholecystitis -s/p perc chole drain by IR on 1/21 -WBC down to 14K today -cont abx therapy for gallbladder for now, will likely need about 10 days total -will follow, will need drain injection in about 4-6 weeks and then decision can be made regarding needs for lap chole.  Given he has no gallstones, he may not need lap chole going forward.  Colonic ileus -still distended. -NGT with minimal output.  Ok to remove -Would recommend GI consult to evaluate colonic ileus for further intervention such as decompressing flex sig, neostigmine, etc.   FEN - NPO/TPN VTE - plavix/heparin ID - zosyn/flagyl Follow up - Dr. Dema Severin  We will follow from the periphery.   LOS: 16 days    Henreitta Cea , [facility] Surgery 07/22/2018, 7:30 AM Pager: [phone]

## 2018-07-22 NOTE — Progress Notes (Addendum)
[facility] [place]  Subjective  - Abdomin is not tender, just sore.   Objective (!) 148/79 (!) 113 97.6 F (36.4 C) (Oral) (!) 28 98%  Intake/Output Summary (Last 24 hours) at 07/22/2018 0757 Last data filed at 07/22/2018 0500 Gross per 24 hour  Intake 1080.73 ml  Output 3800 ml  Net -2719.27 ml    Palpable DP pulses B Abdomin compressible, NTTP, incision healing well Left groin incision well healed, right groin with wound vac incisional only.  Changes per RN staff. Right UQ drain in place Heart irregular tachy  Assessment/Planning: POD # 16 Aortobifemoral  WBC improving now 14.9 Urine OP 3,800, fluid -2,131, Cr 1.53. HGB stable 8.2   Plan per General surgery in regards to gallbladder. cont abx therapy for gallbladder for now, will likely need about 10 days total -will follow, will need drain injection in about 4-6 weeks and then decision can be made regarding needs for lap chole. Given he has no gallstones, he may not need lap chole going forward.  IR has arranged f/u OP for drain check 6+ weeks pending.  Cardiology reviewed and examined patient yesterday.  Recommendations:  recommend continuation of scheduled IV metoprolol. Once able to tolerate POs, transition back to home PO metoprolol dose and titrate further if needed. Given no evidence of atrial fibrillation, there is no indication for anticoagulation at this time.   [name] 07/22/2018 7:57 AM --  Laboratory Lab Results: Recent Labs    07/21/18 0917 07/22/18 0340  WBC 16.7* 14.9*  HGB 8.1* 8.2*  HCT 25.9* 25.7*  PLT 571* 572*   BMET Recent Labs    07/21/18 0300 07/22/18 0340  NA 140 143  K 4.4 4.3  CL 112* 115*  CO2 20* 22  GLUCOSE 200* 221*  BUN 33* 39*  CREATININE 1.40* 1.53*  CALCIUM 8.3* 8.4*    COAG Lab Results  Component Value Date   INR 1.25 07/06/2018   INR 1.04 07/01/2018   INR 1.04 09/06/2017   No results found for: PTT   CV:   eval by cards appreciated.  Continue Beta blockers for tachycardia GI:  I removed NG tube.  May start clears tomorrow if he tolerates NG being out ID:  Continue IV abx.  Will transition to Augmentin once WBC normalizes and he is tolerating PO.  I want him on ABx as long as the cole tube is in place Vasc:  Palpable right dp.  Brisk left PT, DP.  Wound vac draining lymph on right groin incision.  Decreased suction on vac to 70mmHg F/E/N:  Continue TPN Heme:  Monitor Hb and transfuse if it declines.  [employer]

## 2018-07-22 NOTE — Progress Notes (Signed)
Progress Note  Patient Name: Christian Bailey Date of Encounter: 07/22/2018  Primary Cardiologist:   Quay Burow, MD   Subjective   No chest pain.  No SOB.  Mild abdominal pain.   Inpatient Medications    Scheduled Meds: . clopidogrel  75 mg Oral Daily  . docusate sodium  100 mg Oral Daily  . heparin  5,000 Units Subcutaneous Q8H  . insulin aspart  0-15 Units Subcutaneous Q4H  . mouth rinse  15 mL Mouth Rinse BID  . metoprolol tartrate  2.5 mg Intravenous Q6H  . mupirocin ointment   Nasal BID  . pantoprazole (PROTONIX) IV  40 mg Intravenous Daily  . rosuvastatin  20 mg Oral QPM  . sodium chloride flush  10-40 mL Intracatheter Q12H  . sodium chloride flush  5 mL Intracatheter Q8H  . traMADol  50 mg Oral Q6H   Continuous Infusions: . sodium chloride    . magnesium sulfate 1 - 4 g bolus IVPB    . piperacillin-tazobactam (ZOSYN)  IV 3.375 g (07/22/18 0522)  . TPN ADULT (ION) 80 mL/hr at 07/22/18 0500  . TPN ADULT (ION)     PRN Meds: sodium chloride, acetaminophen **OR** acetaminophen, alum & mag hydroxide-simeth, bisacodyl, guaiFENesin-dextromethorphan, magnesium sulfate 1 - 4 g bolus IVPB, metoprolol tartrate, ondansetron, phenol, sodium chloride flush   Vital Signs    Vitals:   07/21/18 1819 07/21/18 1939 07/21/18 2356 07/22/18 0408  BP:  (!) 163/93 (!) 151/81 (!) 148/79  Pulse: (!) 111 (!) 119 (!) 105 (!) 113  Resp: (!) 24 18 17  (!) 28  Temp:  98 F (36.7 C) 97.8 F (36.6 C) 97.6 F (36.4 C)  TempSrc:  Oral Oral Oral  SpO2: 100% 100% 99% 98%  Weight:      Height:        Intake/Output Summary (Last 24 hours) at 07/22/2018 0756 Last data filed at 07/22/2018 0500 Gross per 24 hour  Intake 1080.73 ml  Output 3800 ml  Net -2719.27 ml   Filed Weights   07/06/18 0708 07/08/18 0600 07/16/18 0423  Weight: 68.9 kg 71.2 kg 70.2 kg    Telemetry    NSR with PAC and one brief run of atrial tach.   - Personally Reviewed  ECG    NA - Personally  Reviewed  Physical Exam   GEN: No acute distress.   Neck: No  JVD Cardiac: RRR, no murmurs, rubs, or gallops.  Respiratory: Clear  to auscultation bilaterally. GI: Soft, nontender, non-distended  MS: No  edema; No deformity. Neuro:  Nonfocal  Psych: Normal affect   Labs    Chemistry Recent Labs  Lab 07/17/18 0859 07/18/18 0437  07/20/18 0434 07/21/18 0300 07/22/18 0340  NA 126* 135   < > 138 140 143  K 4.7 4.1   < > 3.6 4.4 4.3  CL 92* 101   < > 109 112* 115*  CO2 24 24   < > 22 20* 22  GLUCOSE 468* 135*   < > 214* 200* 221*  BUN 17 30*   < > 31* 33* 39*  CREATININE 1.38* 1.61*   < > 1.47* 1.40* 1.53*  CALCIUM 8.0* 7.8*   < > 7.9* 8.3* 8.4*  PROT 5.2* 5.3*  --   --  5.7*  --   ALBUMIN 2.1* 2.0*  --   --  1.8*  --   AST 19 31  --   --  55*  --   ALT 11 16  --   --  33  --   ALKPHOS 79 86  --   --  118  --   BILITOT 0.7 1.0  --   --  0.6  --   GFRNONAA 49* 41*   < > 45* 48* 43*  GFRAA 57* 47*   < > 53* 56* 50*  ANIONGAP 10 10   < > 7 8 6    < > = values in this interval not displayed.     Hematology Recent Labs  Lab 07/20/18 0610 07/21/18 0917 07/22/18 0340  WBC 16.5* 16.7* 14.9*  RBC 2.73* 2.83* 2.81*  HGB 7.5* 8.1* 8.2*  HCT 24.9* 25.9* 25.7*  MCV 91.2 91.5 91.5  MCH 27.5 28.6 29.2  MCHC 30.1 31.3 31.9  RDW 15.1 15.3 15.2  PLT 515* 571* 572*    Cardiac Enzymes Recent Labs  Lab 07/19/18 0432  TROPONINI 0.03*   No results for input(s): TROPIPOC in the last 168 hours.   BNPNo results for input(s): BNP, PROBNP in the last 168 hours.   DDimer No results for input(s): DDIMER in the last 168 hours.   Radiology    Dg Abd Portable 1v  Result Date: 07/20/2018 CLINICAL DATA:  Check gastric catheter placement EXAM: PORTABLE ABDOMEN - 1 VIEW COMPARISON:  Film from earlier in the same day. FINDINGS: Scattered large and small bowel gas is noted. Gastric catheter is stable in appearance with the proximal side port within the distal esophagus. The tip is just  beyond the gastroesophageal junction. No significant changes noted. If there has been interval advancement of the catheter films over the chest and neck should be obtained to assess for coiling within the pharynx. IMPRESSION: No change in nasogastric catheter position as described above. Electronically Signed   By: Inez Catalina M.D.   On: 07/20/2018 11:58    Cardiac Studies   Echo 06/20/18  Study Conclusions  - Procedure narrative: Transthoracic echocardiography. Image   quality was poor. The study was technically difficult.   Intravenous contrast (Definity) was administered to opacify the   LV. - Left ventricle: The cavity size was normal. There was mild focal   basal hypertrophy of the septum. Systolic function was normal.   The estimated ejection fraction was in the range of 60% to 65%.   Wall motion was normal; there were no regional wall motion   abnormalities. - Aortic valve: Transvalvular velocity was within the normal range.   There was no stenosis. There was no regurgitation. - Mitral valve: Transvalvular velocity was within the normal range.   There was no evidence for stenosis. There was trivial   regurgitation. - Right ventricle: The cavity size was normal. Wall thickness was   normal. Systolic function was normal. - Atrial septum: No defect or patent foramen ovale was identified. - Tricuspid valve: There was trivial regurgitation. - Pulmonary arteries: Systolic pressure was within the normal   range. PA peak pressure: 34 mm Hg (S).  Patient Profile     78 y.o. male with a hx of PVOD s/p remote stenting of his left common iliac 20 years ago, carotid artery disease, tobacco abuse, HTN, HLD and diabetes, admitted for elective aortobifemoral bypass w/ post operative course complicated by cholecystitis and colonic ileus, s/p perc chole drain. Now with frequent atrial ectopy/ tachycardia, for which cardiology has been consulted, at the request of [name], Vascular Surgery.     Assessment & Plan    Arrhythmia:  No evidence of atrial fib.  No change in therapy.  Rate is OK.  Please call with further questions.   LVH:  No change in therapy.     For questions or updates, please contact [place] Please consult [email or web address] for contact info under Cardiology/STEMI.   Signed, Minus Breeding, MD  07/22/2018, 7:56 AM

## 2018-07-22 NOTE — Progress Notes (Signed)
Physical Therapy Treatment Patient Details Name: Christian Bailey MRN: [id number] DOB: 22-Jan-1941 Today's Date: 07/22/2018    History of Present Illness Pt s/p aortobifemoral bypass and redo of rt fem pop bypass on 07/06/18. Hospital course complicated by ileus, NGT place, pt receiving TPN. Percutaneous chole drain placed 1/21. PMH - rt reverse total shoulder, HTN, DM, OA, PAD, lower extremity bypass    PT Comments    Patient is progressing very well towards their physical therapy goals. Improved activity tolerance today and increased ambulation distance to 100 feet with walker and min assist. HR 115-132 bpm. D/c plan remains appropriate.   Follow Up Recommendations  SNF     Equipment Recommendations  Other (comment)(rollator)    Recommendations for Other Services       Precautions / Restrictions Precautions Precautions: Fall Precaution Comments: NGT, wound vac, chole drain Restrictions Weight Bearing Restrictions: No    Mobility  Bed Mobility Overal bed mobility: Needs Assistance Bed Mobility: Rolling;Sidelying to Sit Rolling: Min assist Sidelying to sit: Mod assist       General bed mobility comments: cues for log roll technique, moderate physical assistance  Transfers Overall transfer level: Needs assistance Equipment used: Rolling walker (2 wheeled) Transfers: Sit to/from Stand Sit to Stand: Min assist         General transfer comment: cues for hand placement, minA to steady  Ambulation/Gait Ambulation/Gait assistance: Min assist Gait Distance (Feet): 100 Feet Assistive device: Rolling walker (2 wheeled) Gait Pattern/deviations: Step-through pattern;Decreased stride length;Trunk flexed Gait velocity: decreased   General Gait Details: min assist for balance and close chair follow. decreased tremulousness noted today   Stairs             Wheelchair Mobility    Modified Rankin (Stroke Patients Only)       Balance Overall balance assessment:  Needs assistance   Sitting balance-Leahy Scale: Fair     Standing balance support: Bilateral upper extremity supported Standing balance-Leahy Scale: Poor Standing balance comment: reliance on B UE and external support, initially with posterior LOB                            Cognition Arousal/Alertness: Awake/alert Behavior During Therapy: WFL for tasks assessed/performed Overall Cognitive Status: Impaired/Different from baseline Area of Impairment: Following commands;Problem solving;Memory                     Memory: Decreased short-term memory Following Commands: Follows one step commands with increased time     Problem Solving: Slow processing;Difficulty sequencing;Requires verbal cues;Requires tactile cues        Exercises      General Comments        Pertinent Vitals/Pain Pain Assessment: Faces Faces Pain Scale: Hurts little more Pain Location: abdomen Pain Descriptors / Indicators: Grimacing;Guarding Pain Intervention(s): Monitored during session    Home Living                      Prior Function            PT Goals (current goals can now be found in the care plan section) Acute Rehab PT Goals Patient Stated Goal: to get stronger Potential to Achieve Goals: Good Progress towards PT goals: Progressing toward goals    Frequency    Min 3X/week      PT Plan Current plan remains appropriate    Co-evaluation  AM-PAC PT "6 Clicks" Mobility   Outcome Measure  Help needed turning from your back to your side while in a flat bed without using bedrails?: None Help needed moving from lying on your back to sitting on the side of a flat bed without using bedrails?: A Lot Help needed moving to and from a bed to a chair (including a wheelchair)?: A Little Help needed standing up from a chair using your arms (e.g., wheelchair or bedside chair)?: A Little Help needed to walk in hospital room?: A Little Help needed  climbing 3-5 steps with a railing? : Total 6 Click Score: 16    End of Session   Activity Tolerance: Patient tolerated treatment well Patient left: in chair;with call bell/phone within reach Nurse Communication: Mobility status PT Visit Diagnosis: Other abnormalities of gait and mobility (R26.89)     Time: 2233-6122 PT Time Calculation (min) (ACUTE ONLY): 22 min  Charges:  $Therapeutic Activity: 8-22 mins                     Ellamae Sia, PT, DPT Acute Rehabilitation Services Pager [phone] Office [phone]    Willy Eddy 07/22/2018, 12:48 PM

## 2018-07-22 NOTE — Progress Notes (Signed)
Pharmacy Antibiotic Note  Christian Bailey is a 78 y.o. male admitted on 07/06/2018 with recurrent claudication now s/p aortobifemoral bypass graft and revised fempop bypass graft on 07/06/18. Post-op, patient has had RUQ abdominal pain, nausea, and abdominal distention and she is now s/p percutaneous cholecystostomy placement with drain placed (1/21). -Zosyn day 6 (noted plans for 10 days) -WBC= 14.9, afebrile, CrCl ~ 35, cultures- ngtd  Plan: Zosyn 3.375g IV q8h Will follow renal function, cultures and clinical progress   Height: 5\' 5"  (165.1 cm) Weight: 154 lb 12.2 oz (70.2 kg) IBW/kg (Calculated) : 61.5  Temp (24hrs), Avg:97.8 F (36.6 C), Min:97.6 F (36.4 C), Max:98 F (36.7 C)  Recent Labs  Lab 07/18/18 0437 07/19/18 0432 07/19/18 0522 07/20/18 0434 07/20/18 0610 07/21/18 0300 07/21/18 0917 07/22/18 0340  WBC 27.0*  --  22.1*  --  16.5*  --  16.7* 14.9*  CREATININE 1.61* 1.55*  --  1.47*  --  1.40*  --  1.53*    Estimated Creatinine Clearance: 35.2 mL/min (A) (by C-G formula based on SCr of 1.53 mg/dL (H)).    Allergies  Allergen Reactions  . Asa [Aspirin] Other (See Comments)    GI bleeding  . Gadolinium Derivatives Other (See Comments)    Pt complained of face flushing/hottness and throat tightness/scratchiness immediately after the injections.  Within 4 minutes, all symptoms were gone and the study was completed.  No further complications or signs of allergy were exhibited after completion of study.   . Oxycodone-Acetaminophen Other (See Comments)    Dizziness, uncomfortable    Antimicrobials this admission: Zosyn 1/19 >>  Microbiology results: 1/21 bile- ngtd  Hildred Laser, PharmD Clinical Pharmacist **Pharmacist phone directory can now be found on [email or web address] ([email or web address]).  Listed under [place].

## 2018-07-23 DIAGNOSIS — I493 Ventricular premature depolarization: Secondary | ICD-10-CM

## 2018-07-23 LAB — GLUCOSE, CAPILLARY
Glucose-Capillary: 192 mg/dL — ABNORMAL HIGH (ref 70–99)
Glucose-Capillary: 177 mg/dL — ABNORMAL HIGH (ref 70–99)
Glucose-Capillary: 187 mg/dL — ABNORMAL HIGH (ref 70–99)
Glucose-Capillary: 197 mg/dL — ABNORMAL HIGH (ref 70–99)
Glucose-Capillary: 175 mg/dL — ABNORMAL HIGH (ref 70–99)

## 2018-07-23 MED ORDER — TRAVASOL 10 % IV SOLN
INTRAVENOUS | Status: AC
  Administered 2018-07-23: 17:00:00 via INTRAVENOUS
  Filled 2018-07-23: qty 1209.6

## 2018-07-23 NOTE — Progress Notes (Addendum)
[facility] [place] [facility]  Subjective  - Doing much better.   Objective (!) 158/62 (!) 112 97.8 F (36.6 C) (Oral) (!) 26 98%  Intake/Output Summary (Last 24 hours) at 07/23/2018 0808 Last data filed at 07/23/2018 0440 Gross per 24 hour  Intake 0 ml  Output 4275 ml  Net -4275 ml    Abdomin soft, NTTP, + BS, incision healing well Right groin incisional vac 150 cc total past 48 hour Palpable DP B LE TPN per left arm IV Heart tachy 120's Lungs non labored Alert and oriented x 3   Assessment/Planning: POD # 17 Aortobifemoral  Labs ordered for tomorrow Positive BS will start clears as tolerates today UO > 4,000 last 24 hours If he tolerates PO's we will discontinue. IVF Encourage mobility IR/Surgery following Cholecystitis drain   [name] 07/23/2018 8:08 AM --  Laboratory Lab Results: Recent Labs    07/21/18 0917 07/22/18 0340  WBC 16.7* 14.9*  HGB 8.1* 8.2*  HCT 25.9* 25.7*  PLT 571* 572*   BMET Recent Labs    07/21/18 0300 07/22/18 0340  NA 140 143  K 4.4 4.3  CL 112* 115*  CO2 20* 22  GLUCOSE 200* 221*  BUN 33* 39*  CREATININE 1.40* 1.53*  CALCIUM 8.3* 8.4*    COAG Lab Results  Component Value Date   INR 1.25 07/06/2018   INR 1.04 07/01/2018   INR 1.04 09/06/2017   No results found for: PTT   I have independently interviewed and examined patient and agree with PA assessment and plan above.   Raenah Murley C. Donzetta Matters, MD [facility] Office: [phone] Pager: [phone]

## 2018-07-23 NOTE — Progress Notes (Signed)
Progress Note  Patient Name: Christian Bailey Date of Encounter: 07/23/2018  Primary Cardiologist:   Quay Burow, MD   Subjective   No cardiac complaints   Inpatient Medications    Scheduled Meds: . clopidogrel  75 mg Oral Daily  . docusate sodium  100 mg Oral Daily  . heparin  5,000 Units Subcutaneous Q8H  . insulin aspart  0-15 Units Subcutaneous Q4H  . mouth rinse  15 mL Mouth Rinse BID  . metoprolol tartrate  2.5 mg Intravenous Q6H  . mupirocin ointment   Nasal BID  . pantoprazole (PROTONIX) IV  40 mg Intravenous Daily  . rosuvastatin  20 mg Oral QPM  . sodium chloride flush  10-40 mL Intracatheter Q12H  . sodium chloride flush  5 mL Intracatheter Q8H  . traMADol  50 mg Oral Q6H   Continuous Infusions: . sodium chloride    . magnesium sulfate 1 - 4 g bolus IVPB    . piperacillin-tazobactam (ZOSYN)  IV 3.375 g (07/23/18 0615)  . TPN ADULT (ION) 80 mL/hr at 07/22/18 1801   PRN Meds: sodium chloride, acetaminophen **OR** acetaminophen, alum & mag hydroxide-simeth, bisacodyl, guaiFENesin-dextromethorphan, magnesium sulfate 1 - 4 g bolus IVPB, metoprolol tartrate, ondansetron, phenol, sodium chloride flush   Vital Signs    Vitals:   07/22/18 1809 07/22/18 1810 07/22/18 1954 07/23/18 0440  BP:   (!) 150/84 (!) 148/85  Pulse: (!) 124 (!) 119 (!) 125 (!) 55  Resp: (!) 23 16 19 19   Temp:   97.9 F (36.6 C) 97.9 F (36.6 C)  TempSrc:   Oral Oral  SpO2: 98% 99% 97% 99%  Weight:      Height:        Intake/Output Summary (Last 24 hours) at 07/23/2018 0742 Last data filed at 07/23/2018 0440 Gross per 24 hour  Intake 0 ml  Output 4525 ml  Net -4525 ml   Filed Weights   07/06/18 0708 07/08/18 0600 07/16/18 0423  Weight: 68.9 kg 71.2 kg 70.2 kg    Telemetry    SR/ST PVC;s occasional bigeminy    - Personally Reviewed  ECG   ST RBBB PVC  Physical Exam   GEN: No acute distress.   Neck: No  JVD Cardiac: RRR, no murmurs, rubs, or gallops.  Respiratory:  Clear  to auscultation bilaterally. GI: Soft, nontender, non-distended  MS: No  edema; No deformity. Neuro:  Nonfocal  Psych: Normal affect   Labs    Chemistry Recent Labs  Lab 07/17/18 0859 07/18/18 0437  07/20/18 0434 07/21/18 0300 07/22/18 0340  NA 126* 135   < > 138 140 143  K 4.7 4.1   < > 3.6 4.4 4.3  CL 92* 101   < > 109 112* 115*  CO2 24 24   < > 22 20* 22  GLUCOSE 468* 135*   < > 214* 200* 221*  BUN 17 30*   < > 31* 33* 39*  CREATININE 1.38* 1.61*   < > 1.47* 1.40* 1.53*  CALCIUM 8.0* 7.8*   < > 7.9* 8.3* 8.4*  PROT 5.2* 5.3*  --   --  5.7*  --   ALBUMIN 2.1* 2.0*  --   --  1.8*  --   AST 19 31  --   --  55*  --   ALT 11 16  --   --  33  --   ALKPHOS 79 86  --   --  118  --   BILITOT  0.7 1.0  --   --  0.6  --   GFRNONAA 49* 41*   < > 45* 48* 43*  GFRAA 57* 47*   < > 53* 56* 50*  ANIONGAP 10 10   < > 7 8 6    < > = values in this interval not displayed.     Hematology Recent Labs  Lab 07/20/18 0610 07/21/18 0917 07/22/18 0340  WBC 16.5* 16.7* 14.9*  RBC 2.73* 2.83* 2.81*  HGB 7.5* 8.1* 8.2*  HCT 24.9* 25.9* 25.7*  MCV 91.2 91.5 91.5  MCH 27.5 28.6 29.2  MCHC 30.1 31.3 31.9  RDW 15.1 15.3 15.2  PLT 515* 571* 572*    Cardiac Enzymes Recent Labs  Lab 07/19/18 0432  TROPONINI 0.03*   No results for input(s): TROPIPOC in the last 168 hours.     Radiology    Dg Abd 1 View  Result Date: 07/22/2018 CLINICAL DATA:  78 year old male with several weeks of persistent abdominal distension. Pelvic retroperitoneal hematoma percutaneous. Status post aortobifemoral bypass. Cholecystostomy tube placement 3 days ago for acute cholecystitis. EXAM: ABDOMEN - 1 VIEW COMPARISON:  07/20/2018 and earlier. FINDINGS: AP supine views at 1037 hours. Right upper quadrant cholecystostomy tube in place. Increased gas-filled but nondilated small bowel since 07/11/2018. No dilated large bowel. Mild to moderate retained stool in the ascending and descending colon. There are  bilateral iliac artery stents. No acute osseous abnormality identified. IMPRESSION: 1. Increased gas-filled but nondilated small bowel since 07/11/2018 might reflect ileus. No evidence of mechanical bowel obstruction. 2. Stable cholecystostomy tube. Electronically Signed   By: Genevie Ann M.D.   On: 07/22/2018 10:58    Cardiac Studies   Echo 06/20/18  Study Conclusions  - Procedure narrative: Transthoracic echocardiography. Image   quality was poor. The study was technically difficult.   Intravenous contrast (Definity) was administered to opacify the   LV. - Left ventricle: The cavity size was normal. There was mild focal   basal hypertrophy of the septum. Systolic function was normal.   The estimated ejection fraction was in the range of 60% to 65%.   Wall motion was normal; there were no regional wall motion   abnormalities. - Aortic valve: Transvalvular velocity was within the normal range.   There was no stenosis. There was no regurgitation. - Mitral valve: Transvalvular velocity was within the normal range.   There was no evidence for stenosis. There was trivial   regurgitation. - Right ventricle: The cavity size was normal. Wall thickness was   normal. Systolic function was normal. - Atrial septum: No defect or patent foramen ovale was identified. - Tricuspid valve: There was trivial regurgitation. - Pulmonary arteries: Systolic pressure was within the normal   range. PA peak pressure: 34 mm Hg (S).  Patient Profile     78 y.o. male with a hx of PVOD s/p remote stenting of his left common iliac 20 years ago, carotid artery disease, tobacco abuse, HTN, HLD and diabetes, admitted for elective aortobifemoral bypass w/ post operative course complicated by cholecystitis and colonic ileus, s/p perc chole drain. Now with frequent atrial ectopy/ tachycardia, for which cardiology has been consulted, at the request of [name], Vascular Surgery.    Assessment & Plan    Arrhythmia:   Iv lopressor written for change to PO when possible 25 bid   LVH:  No change in therapy.     For questions or updates, please contact [place] Please consult [email or web address] for contact info under  Cardiology/STEMI.   Signed, Jenkins Rouge, MD  07/23/2018, 7:42 AM

## 2018-07-23 NOTE — Plan of Care (Signed)

## 2018-07-23 NOTE — Progress Notes (Signed)
PHARMACY - ADULT TOTAL PARENTERAL NUTRITION CONSULT NOTE   Pharmacy Consult:  TPN Indication: Prolonged ileus  Patient Measurements: Height: 5\' 5"  (165.1 cm) Weight: 154 lb 12.2 oz (70.2 kg) IBW/kg (Calculated) : 61.5 TPN AdjBW (KG): 68.9 Body mass index is 25.75 kg/m. Usual Weight: 68.9 kg  Assessment:  68 YOM admitted with recurrent claudication s/p aortobifemoral bypass graft and revised right fem-pop bypass graft on 07/06/18. Clears initiated 1/14 and diet was advanced on 1/16 but patient has had poor intake (0-25% recorded). On 1/17, patient began having RUQ abdominal pain with nausea and sluggish bowel sounds. Symptoms continued to worsen POD#10 (1/18) including increased nausea with sensation to vomit and increased abdominal distension. Patient was receiving dextrose in IVF during this time. Patient was made NPO and pharmacy was consulted to manage TPN for prolonged ileus.   GI: prealbumin <5, amylase 118, lipase WNL, LBM 1/15, +flatus, right groin drainage 265mL.  PPI IV, docusate.  1/20 CT - right pelvic RP hematoma bigger 1/21 HIDA positive for cholecystitis >> perc tube placed Endo: hx DM on metformin PTA - CBGs improving (111-216) Insulin requirements in the past 24 hours: 28 units + 40 units in TPN Lytes: high CL, others WNL Renal: AKI resolved - SCr 1.53, BUN 39 - UOP 2.5 ml/kghr Pulm: stable on RA Cards: chronic RBBB, CAD, PAD, HTN, HLD - BP elevated, HR variable - Plavix, IV metoprolol, Crestor, PRN hydralazine/metoprolol Hepatobil: LFTs / tbili / TG WNL. Neuro: tramadol, PRN Versed - pain controlled ID: Zosyn (1/19 >> ) for IAI - afebrile, WBC down to 14.9, no micro TPN Access: PICC 07/16/18 TPN start date: 07/16/18   Nutritional Goals (per RD rec on 1/18): 2000-2200 kCal, 105-125g protein, 2-2.2L fluid per day  Current Nutrition:  TPN  Plan:  Continue TPN at goal rate of 80 ml/hr, providing 121g AA, 269g CHO and 62g ILE for a total of 2013 kCal, meeting 100% of  patient's needs  Electrolytes in TPN: Cl:Ac 1:1 - no change today Daily multivitamin and trace elements in TPN Continue moderate SSI Q4H + increase regular insulin in TPN to 47 units F/U AM labs, CBGs   Christian Bailey, PharmD, BCPS, [place] 07/23/2018, 7:54 AM

## 2018-07-24 LAB — CBC
HCT: 26.7 % — ABNORMAL LOW (ref 39.0–52.0)
HCT: 26.4 % — ABNORMAL LOW (ref 39.0–52.0)
Hemoglobin: 8.2 g/dL — ABNORMAL LOW (ref 13.0–17.0)
Hemoglobin: 8.1 g/dL — ABNORMAL LOW (ref 13.0–17.0)
MCH: 32.3 pg (ref 26.0–34.0)
MCH: 27.9 pg (ref 26.0–34.0)
MCHC: 30.7 g/dL (ref 30.0–36.0)
MCHC: 30.7 g/dL (ref 30.0–36.0)
MCV: 105.1 fL — ABNORMAL HIGH (ref 80.0–100.0)
MCV: 91 fL (ref 80.0–100.0)
Platelets: 477 10*3/uL — ABNORMAL HIGH (ref 150–400)
Platelets: 519 10*3/uL — ABNORMAL HIGH (ref 150–400)
RBC: 2.54 MIL/uL — AB (ref 4.22–5.81)
RBC: 2.9 MIL/uL — AB (ref 4.22–5.81)
RDW: 16.8 % — ABNORMAL HIGH (ref 11.5–15.5)
RDW: 15.4 % (ref 11.5–15.5)
WBC: 14.5 10*3/uL — AB (ref 4.0–10.5)
WBC: 16.8 10*3/uL — ABNORMAL HIGH (ref 4.0–10.5)
nRBC: 0 % (ref 0.0–0.2)
nRBC: 0 % (ref 0.0–0.2)

## 2018-07-24 LAB — BASIC METABOLIC PANEL
Anion gap: 7 (ref 5–15)
BUN: 44 mg/dL — ABNORMAL HIGH (ref 8–23)
CO2: 25 mmol/L (ref 22–32)
Calcium: 8.1 mg/dL — ABNORMAL LOW (ref 8.9–10.3)
Chloride: 113 mmol/L — ABNORMAL HIGH (ref 98–111)
Creatinine, Ser: 1.46 mg/dL — ABNORMAL HIGH (ref 0.61–1.24)
GFR calc Af Amer: 53 mL/min — ABNORMAL LOW (ref >60)
GFR calc non Af Amer: 46 mL/min — ABNORMAL LOW (ref >60)
Glucose, Bld: 158 mg/dL — ABNORMAL HIGH (ref 70–99)
Potassium: 4.2 mmol/L (ref 3.5–5.1)
Sodium: 145 mmol/L (ref 135–145)

## 2018-07-24 LAB — GLUCOSE, CAPILLARY
Glucose-Capillary: 133 mg/dL — ABNORMAL HIGH (ref 70–99)
Glucose-Capillary: 112 mg/dL — ABNORMAL HIGH (ref 70–99)
Glucose-Capillary: 150 mg/dL — ABNORMAL HIGH (ref 70–99)
Glucose-Capillary: 130 mg/dL — ABNORMAL HIGH (ref 70–99)
Glucose-Capillary: 164 mg/dL — ABNORMAL HIGH (ref 70–99)
Glucose-Capillary: 130 mg/dL — ABNORMAL HIGH (ref 70–99)

## 2018-07-24 LAB — AEROBIC/ANAEROBIC CULTURE W GRAM STAIN (SURGICAL/DEEP WOUND)
Culture: NO GROWTH
Gram Stain: NONE SEEN

## 2018-07-24 LAB — PHOSPHORUS: Phosphorus: 4.2 mg/dL (ref 2.5–4.6)

## 2018-07-24 MED ORDER — METOPROLOL TARTRATE 12.5 MG HALF TABLET
12.5000 mg | ORAL_TABLET | Freq: Two times a day (BID) | ORAL | Status: DC
  Administered 2018-07-24 (×2): 12.5 mg via ORAL
  Filled 2018-07-24 (×3): qty 1

## 2018-07-24 MED ORDER — TRAVASOL 10 % IV SOLN
INTRAVENOUS | Status: DC
  Administered 2018-07-24: 18:00:00 via INTRAVENOUS
  Filled 2018-07-24: qty 576

## 2018-07-24 MED ORDER — PANTOPRAZOLE SODIUM 40 MG PO TBEC
40.0000 mg | DELAYED_RELEASE_TABLET | Freq: Every day | ORAL | Status: DC
  Administered 2018-07-24 – 2018-07-30 (×7): 40 mg via ORAL
  Filled 2018-07-24 (×7): qty 1

## 2018-07-24 NOTE — Progress Notes (Signed)
Progress Note  Patient Name: Christian Bailey Date of Encounter: 07/24/2018  Primary Cardiologist:   Quay Burow, MD   Subjective   No cardiac complaints taking PO now   Inpatient Medications    Scheduled Meds: . clopidogrel  75 mg Oral Daily  . docusate sodium  100 mg Oral Daily  . heparin  5,000 Units Subcutaneous Q8H  . insulin aspart  0-15 Units Subcutaneous Q4H  . mouth rinse  15 mL Mouth Rinse BID  . metoprolol tartrate  2.5 mg Intravenous Q6H  . mupirocin ointment   Nasal BID  . pantoprazole (PROTONIX) IV  40 mg Intravenous Daily  . rosuvastatin  20 mg Oral QPM  . sodium chloride flush  10-40 mL Intracatheter Q12H  . sodium chloride flush  5 mL Intracatheter Q8H  . traMADol  50 mg Oral Q6H   Continuous Infusions: . sodium chloride    . magnesium sulfate 1 - 4 g bolus IVPB    . piperacillin-tazobactam (ZOSYN)  IV 3.375 g (07/24/18 0704)  . TPN ADULT (ION) 80 mL/hr at 07/23/18 1807   PRN Meds: sodium chloride, acetaminophen **OR** acetaminophen, alum & mag hydroxide-simeth, bisacodyl, guaiFENesin-dextromethorphan, magnesium sulfate 1 - 4 g bolus IVPB, metoprolol tartrate, ondansetron, phenol, sodium chloride flush   Vital Signs    Vitals:   07/23/18 2022 07/23/18 2300 07/24/18 0333 07/24/18 0748  BP: 122/72 (!) 142/64 (!) 125/59 (!) 128/57  Pulse: 76 (!) 103 (!) 106 (!) 106  Resp: 16 15 11 16   Temp: 98.4 F (36.9 C) 98.2 F (36.8 C) 98 F (36.7 C) 99.2 F (37.3 C)  TempSrc: Oral Oral Oral Oral  SpO2: 98% 98% 97% 99%  Weight:      Height:        Intake/Output Summary (Last 24 hours) at 07/24/2018 0924 Last data filed at 07/24/2018 0353 Gross per 24 hour  Intake 2973.61 ml  Output 2250 ml  Net 723.61 ml   Filed Weights   07/08/18 0600 07/16/18 0423 07/23/18 1016  Weight: 71.2 kg 70.2 kg 65 kg    Telemetry    SR/ST PVC;s occasional bigeminy    - Personally Reviewed  ECG   ST RBBB PVC  Physical Exam   GEN: No acute distress.   Neck: No   JVD Cardiac: RRR, no murmurs, rubs, or gallops.  Respiratory: Clear  to auscultation bilaterally. GI: Soft, nontender, non-distended  MS: No  edema; No deformity. Neuro:  Nonfocal  Psych: Normal affect   Labs    Chemistry Recent Labs  Lab 07/18/18 0437  07/20/18 0434 07/21/18 0300 07/22/18 0340  NA 135   < > 138 140 143  K 4.1   < > 3.6 4.4 4.3  CL 101   < > 109 112* 115*  CO2 24   < > 22 20* 22  GLUCOSE 135*   < > 214* 200* 221*  BUN 30*   < > 31* 33* 39*  CREATININE 1.61*   < > 1.47* 1.40* 1.53*  CALCIUM 7.8*   < > 7.9* 8.3* 8.4*  PROT 5.3*  --   --  5.7*  --   ALBUMIN 2.0*  --   --  1.8*  --   AST 31  --   --  55*  --   ALT 16  --   --  33  --   ALKPHOS 86  --   --  118  --   BILITOT 1.0  --   --  0.6  --   GFRNONAA 41*   < > 45* 48* 43*  GFRAA 47*   < > 53* 56* 50*  ANIONGAP 10   < > 7 8 6    < > = values in this interval not displayed.     Hematology Recent Labs  Lab 07/21/18 0917 07/22/18 0340 07/24/18 0500  WBC 16.7* 14.9* 14.5*  RBC 2.83* 2.81* 2.54*  HGB 8.1* 8.2* 8.2*  HCT 25.9* 25.7* 26.7*  MCV 91.5 91.5 105.1*  MCH 28.6 29.2 32.3  MCHC 31.3 31.9 30.7  RDW 15.3 15.2 16.8*  PLT 571* 572* 477*    Cardiac Enzymes Recent Labs  Lab 07/19/18 0432  TROPONINI 0.03*   No results for input(s): TROPIPOC in the last 168 hours.     Radiology    Dg Abd 1 View  Result Date: 07/22/2018 CLINICAL DATA:  78 year old male with several weeks of persistent abdominal distension. Pelvic retroperitoneal hematoma percutaneous. Status post aortobifemoral bypass. Cholecystostomy tube placement 3 days ago for acute cholecystitis. EXAM: ABDOMEN - 1 VIEW COMPARISON:  07/20/2018 and earlier. FINDINGS: AP supine views at 1037 hours. Right upper quadrant cholecystostomy tube in place. Increased gas-filled but nondilated small bowel since 07/11/2018. No dilated large bowel. Mild to moderate retained stool in the ascending and descending colon. There are bilateral iliac  artery stents. No acute osseous abnormality identified. IMPRESSION: 1. Increased gas-filled but nondilated small bowel since 07/11/2018 might reflect ileus. No evidence of mechanical bowel obstruction. 2. Stable cholecystostomy tube. Electronically Signed   By: Genevie Ann M.D.   On: 07/22/2018 10:58    Cardiac Studies   Echo 06/20/18  Study Conclusions  - Procedure narrative: Transthoracic echocardiography. Image   quality was poor. The study was technically difficult.   Intravenous contrast (Definity) was administered to opacify the   LV. - Left ventricle: The cavity size was normal. There was mild focal   basal hypertrophy of the septum. Systolic function was normal.   The estimated ejection fraction was in the range of 60% to 65%.   Wall motion was normal; there were no regional wall motion   abnormalities. - Aortic valve: Transvalvular velocity was within the normal range.   There was no stenosis. There was no regurgitation. - Mitral valve: Transvalvular velocity was within the normal range.   There was no evidence for stenosis. There was trivial   regurgitation. - Right ventricle: The cavity size was normal. Wall thickness was   normal. Systolic function was normal. - Atrial septum: No defect or patent foramen ovale was identified. - Tricuspid valve: There was trivial regurgitation. - Pulmonary arteries: Systolic pressure was within the normal   range. PA peak pressure: 34 mm Hg (S).  Patient Profile     78 y.o. male with a hx of PVOD s/p remote stenting of his left common iliac 20 years ago, carotid artery disease, tobacco abuse, HTN, HLD and diabetes, admitted for elective aortobifemoral bypass w/ post operative course complicated by cholecystitis and colonic ileus, s/p perc chole drain. Now with frequent atrial ectopy/ tachycardia, for which cardiology has been consulted, at the request of [name], Vascular Surgery.    Assessment & Plan    Arrhythmia:  SR/ST with PVC;s  add oral beta blocker today   LVH:  No change in therapy.     For questions or updates, please contact [place] Please consult [email or web address] for contact info under Cardiology/STEMI.   Signed, Jenkins Rouge, MD  07/24/2018, 9:24 AM

## 2018-07-24 NOTE — Progress Notes (Addendum)
[facility] [place]  Subjective  - Feeling better with decresed abdominal pain.   Objective (!) 128/57 (!) 106 99.2 F (37.3 C) (Oral) 16 99%  Intake/Output Summary (Last 24 hours) at 07/24/2018 0851 Last data filed at 07/24/2018 0353 Gross per 24 hour  Intake 2973.61 ml  Output 2250 ml  Net 723.61 ml    Palpable DP B LE Right groin with wound vac in place 50 cc output last 24 hours ss.  Wound vac changed at bed side yesterday Abdominal incision healing well, NTTP Heart tachy 100-110  Assessment/Planning: POD # 18 Aortobifemoral Cholecystitis  WBC improving 14.5 afebrile Tolerated clear diet will advanc slowly He is ambulating in the halls UO 2800 Patent flow in B LE with palpable pulses.   Christian Bailey 07/24/2018 8:51 AM --  Laboratory Lab Results: Recent Labs    07/22/18 0340 07/24/18 0500  WBC 14.9* 14.5*  HGB 8.2* 8.2*  HCT 25.7* 26.7*  PLT 572* 477*   BMET Recent Labs    07/22/18 0340  NA 143  K 4.3  CL 115*  CO2 22  GLUCOSE 221*  BUN 39*  CREATININE 1.53*  CALCIUM 8.4*    COAG Lab Results  Component Value Date   INR 1.25 07/06/2018   INR 1.04 07/01/2018   INR 1.04 09/06/2017   No results found for: PTT    I have interviewed and examinedpatient with PA and agree with assessment and plan above.  He has a persistent leukocytosis which has somewhat increased this morning.  Overall he is feeling well and tolerated clears we can advance his diet slowly.  Therasa Lorenzi C. Donzetta Matters, MD [facility] [place] Office: [phone] Pager: [phone]

## 2018-07-24 NOTE — Plan of Care (Signed)

## 2018-07-24 NOTE — Progress Notes (Signed)
PHARMACY - ADULT TOTAL PARENTERAL NUTRITION CONSULT NOTE   Pharmacy Consult:  TPN Indication: Prolonged ileus  Patient Measurements: Height: 5\' 5"  (165.1 cm) Weight: 143 lb 3.2 oz (65 kg) IBW/kg (Calculated) : 61.5 TPN AdjBW (KG): 68.9 Body mass index is 23.83 kg/m. Usual Weight: 68.9 kg  Assessment:  72 YOM admitted with recurrent claudication s/p aortobifemoral bypass graft and revised right fem-pop bypass graft on 07/06/18. Clears initiated 1/14 and diet was advanced on 1/16 but patient has had poor intake (0-25% recorded). On 1/17, patient began having RUQ abdominal pain with nausea and sluggish bowel sounds. Symptoms continued to worsen POD#10 (1/18) including increased nausea with sensation to vomit and increased abdominal distension. Patient was receiving dextrose in IVF during this time. Patient was made NPO and pharmacy was consulted to manage TPN for prolonged ileus.   GI: prealbumin <5, amylase 118, lipase WNL, LBM 1/15, +flatus, right groin drainage 130mL.  PPI IV, docusate.  1/20 CT - right pelvic RP hematoma bigger 1/21 HIDA positive for cholecystitis >> perc tube placed Feels full so only eats 50% of meals.  Denies N/V/abd pain.  Endo: hx DM on metformin PTA - CBGs controlled Insulin requirements in the past 24 hours: 15 units + 47 units in TPN Lytes: all WNL (Na/Phos high normal) Renal: AKI resolved - SCr 1.46 at baseline, BUN 39 - UOP 1.8 ml/kghr, -8.5L Pulm: stable on RA Cards: chronic RBBB, CAD, PAD, HTN, HLD - BP controlled, tachy - Plavix, IV metoprolol, Crestor, PRN hydralazine/metoprolol Hepatobil: LFTs / tbili / TG WNL. Neuro: tramadol, PRN Versed - pain controlled ID: Zosyn (1/19 >> ) for IAI - afebrile, WBC down to 14.9, no micro TPN Access: PICC 07/16/18 TPN start date: 07/16/18   Nutritional Goals (per RD rec on 1/18): 2000-2200 kCal, 105-125g protein, 2-2.2L fluid per day  Current Nutrition:  TPN Heart healthy, carb modified diet  Plan:  Discussed  with Vascular, reduce TPN to 40 ml/hr (goal rate 80 ml/hr) TPN will provide 58g AA, 125g CHO and 30g ILE for a total of 952 kCal, meeting ~50% of patient's needs Electrolytes in TPN: change Cl:Ac to 1:2 - reduce all lytes with reduced TPN rate Daily multivitamin and trace elements in TPN Continue moderate SSI Q4H + reduce regular insulin in TPN to 20 units F/U AM labs, PO intake to wean off TPN in AM  Christian Bailey D. Mina Marble, PharmD, BCPS, [place] 07/24/2018, 10:21 AM

## 2018-07-25 DIAGNOSIS — I700 Atherosclerosis of aorta: Secondary | ICD-10-CM

## 2018-07-25 LAB — COMPREHENSIVE METABOLIC PANEL
ALT: 23 U/L (ref 0–44)
AST: 23 U/L (ref 15–41)
Albumin: 1.8 g/dL — ABNORMAL LOW (ref 3.5–5.0)
Alkaline Phosphatase: 108 U/L (ref 38–126)
Anion gap: 9 (ref 5–15)
BUN: 45 mg/dL — ABNORMAL HIGH (ref 8–23)
CO2: 25 mmol/L (ref 22–32)
Calcium: 8.2 mg/dL — ABNORMAL LOW (ref 8.9–10.3)
Chloride: 110 mmol/L (ref 98–111)
Creatinine, Ser: 1.65 mg/dL — ABNORMAL HIGH (ref 0.61–1.24)
GFR, EST AFRICAN AMERICAN: 46 mL/min — AB (ref >60)
GFR, EST NON AFRICAN AMERICAN: 39 mL/min — AB (ref >60)
Glucose, Bld: 125 mg/dL — ABNORMAL HIGH (ref 70–99)
Potassium: 4.6 mmol/L (ref 3.5–5.1)
Sodium: 144 mmol/L (ref 135–145)
Total Bilirubin: 0.7 mg/dL (ref 0.3–1.2)
Total Protein: 6 g/dL — ABNORMAL LOW (ref 6.5–8.1)

## 2018-07-25 LAB — CBC
HCT: 26.4 % — ABNORMAL LOW (ref 39.0–52.0)
Hemoglobin: 7.9 g/dL — ABNORMAL LOW (ref 13.0–17.0)
MCH: 27.2 pg (ref 26.0–34.0)
MCHC: 29.9 g/dL — ABNORMAL LOW (ref 30.0–36.0)
MCV: 91 fL (ref 80.0–100.0)
PLATELETS: 462 10*3/uL — AB (ref 150–400)
RBC: 2.9 MIL/uL — ABNORMAL LOW (ref 4.22–5.81)
RDW: 15.3 % (ref 11.5–15.5)
WBC: 15.4 10*3/uL — ABNORMAL HIGH (ref 4.0–10.5)
nRBC: 0 % (ref 0.0–0.2)

## 2018-07-25 LAB — DIFFERENTIAL
ABS IMMATURE GRANULOCYTES: 0.3 10*3/uL — AB (ref 0.00–0.07)
Basophils Absolute: 0.1 10*3/uL (ref 0.0–0.1)
Basophils Relative: 1 %
Eosinophils Absolute: 0.8 10*3/uL — ABNORMAL HIGH (ref 0.0–0.5)
Eosinophils Relative: 5 %
Immature Granulocytes: 2 %
Lymphocytes Relative: 9 %
Lymphs Abs: 1.4 10*3/uL (ref 0.7–4.0)
Monocytes Absolute: 0.9 10*3/uL (ref 0.1–1.0)
Monocytes Relative: 6 %
Neutro Abs: 11.9 10*3/uL — ABNORMAL HIGH (ref 1.7–7.7)
Neutrophils Relative %: 77 %

## 2018-07-25 LAB — PREALBUMIN: Prealbumin: 9.2 mg/dL — ABNORMAL LOW (ref 18–38)

## 2018-07-25 LAB — GLUCOSE, CAPILLARY
Glucose-Capillary: 108 mg/dL — ABNORMAL HIGH (ref 70–99)
Glucose-Capillary: 109 mg/dL — ABNORMAL HIGH (ref 70–99)
Glucose-Capillary: 112 mg/dL — ABNORMAL HIGH (ref 70–99)
Glucose-Capillary: 120 mg/dL — ABNORMAL HIGH (ref 70–99)
Glucose-Capillary: 142 mg/dL — ABNORMAL HIGH (ref 70–99)
Glucose-Capillary: 159 mg/dL — ABNORMAL HIGH (ref 70–99)

## 2018-07-25 LAB — TRIGLYCERIDES: Triglycerides: 87 mg/dL (ref <150)

## 2018-07-25 LAB — PHOSPHORUS: PHOSPHORUS: 4.5 mg/dL (ref 2.5–4.6)

## 2018-07-25 LAB — MAGNESIUM: Magnesium: 2.1 mg/dL (ref 1.7–2.4)

## 2018-07-25 MED ORDER — METOPROLOL TARTRATE 12.5 MG HALF TABLET
12.5000 mg | ORAL_TABLET | ORAL | Status: AC
  Administered 2018-07-25: 12.5 mg via ORAL

## 2018-07-25 MED ORDER — INSULIN ASPART 100 UNIT/ML ~~LOC~~ SOLN
0.0000 [IU] | Freq: Three times a day (TID) | SUBCUTANEOUS | Status: DC
  Administered 2018-07-25: 2 [IU] via SUBCUTANEOUS
  Administered 2018-07-25: 3 [IU] via SUBCUTANEOUS
  Administered 2018-07-26: 5 [IU] via SUBCUTANEOUS
  Administered 2018-07-27: 2 [IU] via SUBCUTANEOUS
  Administered 2018-07-27: 3 [IU] via SUBCUTANEOUS
  Administered 2018-07-28: 2 [IU] via SUBCUTANEOUS
  Administered 2018-07-29: 3 [IU] via SUBCUTANEOUS
  Administered 2018-07-29 – 2018-07-30 (×2): 2 [IU] via SUBCUTANEOUS

## 2018-07-25 MED ORDER — INSULIN ASPART 100 UNIT/ML ~~LOC~~ SOLN: 0.0000 [IU] | Freq: Every day | SUBCUTANEOUS | Status: DC

## 2018-07-25 MED ORDER — METOPROLOL TARTRATE 25 MG PO TABS
25.0000 mg | ORAL_TABLET | Freq: Two times a day (BID) | ORAL | Status: DC
  Administered 2018-07-25 – 2018-07-30 (×10): 25 mg via ORAL
  Filled 2018-07-25 (×10): qty 1

## 2018-07-25 NOTE — Progress Notes (Signed)
Pharmacy Antibiotic Note  Christian Bailey is a 78 y.o. male admitted on 07/06/2018 with recurrent claudication now s/p aortobifemoral bypass graft and revised fempop bypass graft on 07/06/18. Post-op, patient has had RUQ abdominal pain, nausea, and abdominal distention and she is now s/p percutaneous cholecystostomy placement with drain placed (1/21). -Zosyn day 9 (noted plans for 10 days) -WBC= 15.4, afebrile, CrCl ~ 35, cultures- neg  Plan: Zosyn 3.375g IV q8h Will follow renal function, cultures and clinical progress   Height: 5\' 5"  (165.1 cm) Weight: 143 lb 3.2 oz (65 kg) IBW/kg (Calculated) : 61.5  Temp (24hrs), Avg:98.3 F (36.8 C), Min:97.7 F (36.5 C), Max:99.3 F (37.4 C)  Recent Labs  Lab 07/20/18 0434  07/21/18 0300 07/21/18 0917 07/22/18 0340 07/24/18 0500 07/24/18 0856 07/25/18 0417  WBC  --    < >  --  16.7* 14.9* 14.5* 16.8* 15.4*  CREATININE 1.47*  --  1.40*  --  1.53*  --  1.46* 1.65*   < > = values in this interval not displayed.    Estimated Creatinine Clearance: 32.6 mL/min (A) (by C-G formula based on SCr of 1.65 mg/dL (H)).    Allergies  Allergen Reactions  . Asa [Aspirin] Other (See Comments)    GI bleeding  . Gadolinium Derivatives Other (See Comments)    Pt complained of face flushing/hottness and throat tightness/scratchiness immediately after the injections.  Within 4 minutes, all symptoms were gone and the study was completed.  No further complications or signs of allergy were exhibited after completion of study.   . Oxycodone-Acetaminophen Other (See Comments)    Dizziness, uncomfortable    Antimicrobials this admission: Zosyn 1/19 >>  Microbiology results: 1/21 bile- ngtd  Hildred Laser, PharmD Clinical Pharmacist **Pharmacist phone directory can now be found on [email or web address] ([email or web address]).  Listed under [place].

## 2018-07-25 NOTE — Final Consult Note (Signed)
Consultant Final Sign-Off Note    Assessment/Final recommendations  Christian Bailey is a 78 y.o. male followed by me for acalculous cholecystitis and colonic ileus. Currently patient has percutaneous cholecystostomy tube in place with bilious drainage. Patient denies abdominal pain. Tolerating diet and having bowel function.    Wound care (if applicable): per vascular surgery   Diet at discharge: per primary team   Activity at discharge: per primary team   Follow-up appointment:  Dr. Dema Severin 2/25, Interventional radiology   Pending results:  Unresulted Labs (From admission, onward)    Start     Ordered   07/26/18 0500  CBC  Daily,   R    Question:  Specimen collection method  Answer:  Unit=Unit collect   07/25/18 0749   07/26/18 7062  Basic metabolic panel  Daily,   R    Question:  Specimen collection method  Answer:  Unit=Unit collect   07/25/18 0749   07/18/18 0500  Comprehensive metabolic panel  (TPN Lab Panel)  Every Mon,Thu (0500),   R    Question:  Specimen collection method  Answer:  Lab=Lab collect   07/16/18 1016   07/18/18 0500  Magnesium  (TPN Lab Panel)  Every Mon,Thu (0500),   R    Question:  Specimen collection method  Answer:  Lab=Lab collect   07/16/18 1016   07/18/18 0500  Phosphorus  (TPN Lab Panel)  Every Mon,Thu (0500),   R    Question:  Specimen collection method  Answer:  Lab=Lab collect   07/16/18 1016   07/18/18 0500  CBC  (TPN Lab Panel)  Every Monday (0500),   R    Question:  Specimen collection method  Answer:  Lab=Lab collect   07/16/18 1016   07/18/18 0500  Differential  (TPN Lab Panel)  Every Monday (0500),   R    Question:  Specimen collection method  Answer:  Lab=Lab collect   07/16/18 1016   07/18/18 0500  Triglycerides  (TPN Lab Panel)  Every Monday (0500),   R    Question:  Specimen collection method  Answer:  Lab=Lab collect   07/16/18 1016   07/18/18 0500  Prealbumin  (TPN Lab Panel)  Every Monday (0500),   R    Question:  Specimen collection  method  Answer:  Lab=Lab collect   07/16/18 1016           Medication recommendations: suppositories and colace/miralax prn   Other recommendations: continue drain care    Thank you for allowing [place] to participate in the care of your patient!  Please consult [place] again if you have further needs for your patient.  [name] 07/25/2018 8:16 AM    Subjective   Patient reports he is feeling improved. Tolerating PO. Had 2 BM yesterday. Got out of bed 2x yesterday. Denies abdominal pain.  Objective  Vital signs in last 24 hours: Temp:  [97.7 F (36.5 C)-99.3 F (37.4 C)] 97.7 F (36.5 C) (01/27 0752) Pulse Rate:  [94-106] 94 (01/27 0752) Resp:  [9-19] 16 (01/27 0752) BP: (108-123)/(53-73) 117/64 (01/27 0752) SpO2:  [96 %-100 %] 99 % (01/27 0752)  Physical Exam  Constitutional: He is well-developed, well-nourished, and in no distress.  Cardiovascular: Normal rate and regular rhythm.  Pulmonary/Chest: Effort normal and breath sounds normal.  Abdominal: Soft. Bowel sounds are normal. He exhibits distension. There is no abdominal tenderness. There is no rigidity and no guarding.  Midline incision well healing; RUQ drain with bilious drainage  Skin: Skin  is warm and dry.    Pertinent labs and Studies: Recent Labs    07/24/18 0500 07/24/18 0856 07/25/18 0417  WBC 14.5* 16.8* 15.4*  HGB 8.2* 8.1* 7.9*  HCT 26.7* 26.4* 26.4*   BMET Recent Labs    07/24/18 0856 07/25/18 0417  NA 145 144  K 4.2 4.6  CL 113* 110  CO2 25 25  GLUCOSE 158* 125*  BUN 44* 45*  CREATININE 1.46* 1.65*  CALCIUM 8.1* 8.2*   No results for input(s): LABURIN in the last 72 hours. Results for orders placed or performed during the hospital encounter of 07/06/18  Aerobic/Anaerobic Culture (surgical/deep wound)     Status: None   Collection Time: 07/19/18  5:41 PM  Result Value Ref Range Status   Specimen Description GALL BLADDER  Final   Special Requests NONE  Final   Gram Stain NO WBC  SEEN NO ORGANISMS SEEN   Final   Culture   Final    No growth aerobically or anaerobically. Performed at [facility], [place] [address] [place], [place] [address]    Report Status 07/24/2018 FINAL  Final    Imaging: No results found.

## 2018-07-25 NOTE — NC FL2 (Signed)
[place] LEVEL OF CARE SCREENING TOOL     IDENTIFICATION  Patient Name: Christian Bailey Birthdate: [birth year] Sex: male Admission Date (Current Location): 07/06/2018  [facility] and [place] Number:  Herbalist and Address:  [facility], [place] [address], [place], [place] [address]      Provider Number: [id number]  Attending Physician Name and Address:  Serafina Mitchell, MD  Relative Name and Phone Number:  Lamorris Knoblock [phone]    Current Level of Care: Hospital Recommended Level of Care: [place] Prior Approval Number:    Date Approved/Denied:   PASRR Number: [id number]  Discharge Plan: SNF    Current Diagnoses: Patient Active Problem List   Diagnosis Date Noted  . Stenosis of infrarenal abdominal aorta due to atherosclerosis ([place]) 07/06/2018  . S/P shoulder replacement, right 12/07/2016  . PVD (peripheral vascular disease) ([place]) 09/18/2016  . Sinus tachycardia 05/15/2014  . Carotid artery disease ([place]) 04/26/2014  . PAD (peripheral artery disease), hx remote stent Lt common iliac, 04/2010-stent to lt common iliac & 2 stents to the Rt SFA, 04/2014 - s/p PTA of right SFA for in-stent restenosis  06/12/2013  . 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 Claudication ([place]) 12/14/2012  . Essential hypertension 12/14/2012  . Hyperlipidemia 12/14/2012  . Type 2 diabetes mellitus ([place]) 12/14/2012  . Personal history of colonic polyps 01/25/2012  . DM 08/11/2010  . DUODENAL ULCER, ACUTE, HEMORRHAGE 08/11/2010    Orientation RESPIRATION BLADDER Height & Weight     Self, Time, Situation, Place  Normal External catheter Weight: 143 lb 3.2 oz (65 kg) Height:  5\' 5"  (165.1 cm)  BEHAVIORAL SYMPTOMS/MOOD NEUROLOGICAL BOWEL NUTRITION STATUS      Continent Diet(please see discharge summary)  AMBULATORY STATUS COMMUNICATION OF NEEDS Skin   Limited Assist Verbally Other  (Comment), Surgical wounds(incision(closed) abdomen, incsision(closed) groin, right and leftt, negative pressure wound therapy groin, right)                       Personal Care Assistance Level of Assistance  Bathing, Feeding, Dressing Bathing Assistance: Limited assistance Feeding assistance: Independent Dressing Assistance: Limited assistance     Functional Limitations Info  Sight, Hearing, Speech Sight Info: Adequate Hearing Info: Adequate Speech Info: Adequate    SPECIAL CARE FACTORS FREQUENCY  PT (By licensed PT), OT (By licensed OT)     PT Frequency: 3x per week OT Frequency: 3x per week            Contractures Contractures Info: Not present    Additional Factors Info  Code Status, Allergies, Isolation Precautions Code Status Info: FULL Allergies Info: Asa aspirin, Gadolinium Derivatives, Oxycodone-acetaminophen     Isolation Precautions Info: MRSA     Current Medications (07/25/2018):  This is the current hospital active medication list Current Facility-Administered Medications  Medication Dose Route Frequency Provider Last Rate Last Dose  . 0.9 %  sodium chloride infusion  500 mL Intravenous Once PRN Dagoberto Ligas, PA-C      . acetaminophen (TYLENOL) tablet 325-650 mg  325-650 mg Oral Q4H PRN Dagoberto Ligas, PA-C   650 mg at 07/12/18 1639   Or  . acetaminophen (TYLENOL) suppository 325-650 mg  325-650 mg Rectal Q4H PRN Dagoberto Ligas, PA-C      . alum & mag hydroxide-simeth (MAALOX/MYLANTA) 200-200-20 MG/5ML suspension 15-30 mL  15-30 mL Oral Q2H PRN Dagoberto Ligas, PA-C      .  bisacodyl (DULCOLAX) suppository 10 mg  10 mg Rectal Daily PRN Dagoberto Ligas, PA-C   10 mg at 07/15/18 1538  . clopidogrel (PLAVIX) tablet 75 mg  75 mg Oral Daily Laurence Slate M, PA-C   75 mg at 07/25/18 1033  . docusate sodium (COLACE) capsule 100 mg  100 mg Oral Daily Dagoberto Ligas, PA-C   100 mg at 07/25/18 1033  . guaiFENesin-dextromethorphan (ROBITUSSIN DM)  100-10 MG/5ML syrup 15 mL  15 mL Oral Q4H PRN Dagoberto Ligas, PA-C      . heparin injection 5,000 Units  5,000 Units Subcutaneous Q8H Serafina Mitchell, MD   5,000 Units at 07/25/18 0640  . insulin aspart (novoLOG) injection 0-15 Units  0-15 Units Subcutaneous TID WC Romona Curls, [facility]   3 Units at 07/25/18 1207  . insulin aspart (novoLOG) injection 0-5 Units  0-5 Units Subcutaneous QHS Romona Curls, RPH      . magnesium sulfate IVPB 2 g 50 mL  2 g Intravenous Daily PRN Dagoberto Ligas, PA-C      . MEDLINE mouth rinse  15 mL Mouth Rinse BID Dagoberto Ligas, PA-C   15 mL at 07/25/18 1037  . metoprolol tartrate (LOPRESSOR) injection 5 mg  5 mg Intravenous Q6H PRN Marty Heck, MD   5 mg at 07/18/18 2358  . metoprolol tartrate (LOPRESSOR) tablet 25 mg  25 mg Oral BID Pixie Casino, MD      . mupirocin ointment (BACTROBAN) 2 %   Nasal BID Dagoberto Ligas, PA-C      . ondansetron [facility]) injection 4 mg  4 mg Intravenous Q6H PRN Dagoberto Ligas, PA-C   4 mg at 07/16/18 0925  . pantoprazole (PROTONIX) EC tablet 40 mg  40 mg Oral Daily Serafina Mitchell, MD   40 mg at 07/25/18 1033  . phenol (CHLORASEPTIC) mouth spray 1 spray  1 spray Mouth/Throat PRN Dagoberto Ligas, PA-C      . piperacillin-tazobactam (ZOSYN) IVPB 3.375 g  3.375 g Intravenous Q8H Serafina Mitchell, MD 12.5 mL/hr at 07/25/18 1442 3.375 g at 07/25/18 1442  . rosuvastatin (CRESTOR) tablet 20 mg  20 mg Oral QPM Dagoberto Ligas, PA-C   20 mg at 07/24/18 1726  . sodium chloride flush (NS) 0.9 % injection 10-40 mL  10-40 mL Intracatheter Q12H Serafina Mitchell, MD   10 mL at 07/25/18 1040  . sodium chloride flush (NS) 0.9 % injection 10-40 mL  10-40 mL Intracatheter PRN Serafina Mitchell, MD   10 mL at 07/18/18 1733  . sodium chloride flush (NS) 0.9 % injection 5 mL  5 mL Intracatheter Q8H Arne Cleveland, MD   5 mL at 07/24/18 2305  . traMADol (ULTRAM) tablet 50 mg  50 mg Oral Q6H CollinsTerrence Dupont M, PA-C   50 mg at 07/25/18 1206      Discharge Medications: Please see discharge summary for a list of discharge medications.  Relevant Imaging Results:  Relevant Lab Results:   Additional Information SSN [id number]  Vinie Sill, LCSWA

## 2018-07-25 NOTE — Progress Notes (Signed)
Physical Therapy Treatment Patient Details Name: Christian Bailey MRN: [id number] DOB: 1940/08/04 Today's Date: 07/25/2018    History of Present Illness Pt s/p aortobifemoral bypass and redo of rt fem pop bypass on 07/06/18. Hospital course complicated by ileus, NGT place, and cholecystitis. Percutaneous chole drain placed 1/21. PMH - rt reverse total shoulder, HTN, DM, OA, PAD, lower extremity bypass    PT Comments    Pt beginning to make good progress with mobility again. Continue to recommend ST-SNF.   Follow Up Recommendations  SNF     Equipment Recommendations  Other (comment)(rollator)    Recommendations for Other Services       Precautions / Restrictions Precautions Precautions: Fall Precaution Comments:  wound vac, chole drain Restrictions Weight Bearing Restrictions: No    Mobility  Bed Mobility               General bed mobility comments: Pt up on BSC  Transfers Overall transfer level: Needs assistance Equipment used: 4-wheeled walker Transfers: Sit to/from Stand Sit to Stand: Min assist         General transfer comment: Assist for balance and verbal cues for hand placement  Ambulation/Gait Ambulation/Gait assistance: Min assist Gait Distance (Feet): 220 Feet Assistive device: 4-wheeled walker Gait Pattern/deviations: Step-through pattern;Decreased stride length;Drifts right/left;Trunk flexed Gait velocity: decreased Gait velocity interpretation: 1.31 - 2.62 ft/sec, indicative of limited community ambulator General Gait Details: Assist for balance and safety. 1 standing rest break. HR to 122.   Stairs             Wheelchair Mobility    Modified Rankin (Stroke Patients Only)       Balance Overall balance assessment: Needs assistance Sitting-balance support: No upper extremity supported;Feet supported Sitting balance-Leahy Scale: Fair     Standing balance support: Bilateral upper extremity supported Standing balance-Leahy Scale:  Poor Standing balance comment: UE support and min guard for static standing                            Cognition Arousal/Alertness: Awake/alert Behavior During Therapy: WFL for tasks assessed/performed Overall Cognitive Status: Within Functional Limits for tasks assessed                                        Exercises      General Comments        Pertinent Vitals/Pain Pain Assessment: No/denies pain    Home Living                      Prior Function            PT Goals (current goals can now be found in the care plan section) Progress towards PT goals: Progressing toward goals    Frequency    Min 3X/week      PT Plan Current plan remains appropriate    Co-evaluation              AM-PAC PT "6 Clicks" Mobility   Outcome Measure  Help needed turning from your back to your side while in a flat bed without using bedrails?: None Help needed moving from lying on your back to sitting on the side of a flat bed without using bedrails?: A Little Help needed moving to and from a bed to a chair (including a wheelchair)?: A Little Help needed standing up from  a chair using your arms (e.g., wheelchair or bedside chair)?: A Little Help needed to walk in hospital room?: A Little Help needed climbing 3-5 steps with a railing? : A Little 6 Click Score: 19    End of Session Equipment Utilized During Treatment: Gait belt Activity Tolerance: Patient tolerated treatment well Patient left: in chair;with call bell/phone within reach Nurse Communication: Mobility status PT Visit Diagnosis: Other abnormalities of gait and mobility (R26.89)     Time: 5638-7564 PT Time Calculation (min) (ACUTE ONLY): 9 min  Charges:  $Gait Training: 8-22 mins                     [place] Pager [phone] Office [place] 07/25/2018, 11:06 AM

## 2018-07-25 NOTE — Progress Notes (Signed)
Progress Note  Patient Name: Christian Bailey Date of Encounter: 07/25/2018  Primary Cardiologist:   Quay Burow, MD   Subjective   No cardiac issues overnight. Noted to have PAC's and PVC's with trigeminy at times.  Inpatient Medications    Scheduled Meds: . clopidogrel  75 mg Oral Daily  . docusate sodium  100 mg Oral Daily  . heparin  5,000 Units Subcutaneous Q8H  . insulin aspart  0-15 Units Subcutaneous TID WC  . insulin aspart  0-5 Units Subcutaneous QHS  . mouth rinse  15 mL Mouth Rinse BID  . metoprolol tartrate  2.5 mg Intravenous Q6H  . metoprolol tartrate  12.5 mg Oral BID  . mupirocin ointment   Nasal BID  . pantoprazole  40 mg Oral Daily  . rosuvastatin  20 mg Oral QPM  . sodium chloride flush  10-40 mL Intracatheter Q12H  . sodium chloride flush  5 mL Intracatheter Q8H  . traMADol  50 mg Oral Q6H   Continuous Infusions: . sodium chloride    . magnesium sulfate 1 - 4 g bolus IVPB    . piperacillin-tazobactam (ZOSYN)  IV 3.375 g (07/25/18 0646)  . TPN ADULT (ION) 40 mL/hr at 07/24/18 1737   PRN Meds: sodium chloride, acetaminophen **OR** acetaminophen, alum & mag hydroxide-simeth, bisacodyl, guaiFENesin-dextromethorphan, magnesium sulfate 1 - 4 g bolus IVPB, metoprolol tartrate, ondansetron, phenol, sodium chloride flush   Vital Signs    Vitals:   07/24/18 1959 07/24/18 2357 07/25/18 0427 07/25/18 0752  BP: (!) 115/55 (!) 108/55 (!) 120/58 117/64  Pulse: (!) 103 94 94 94  Resp: 15 (!) 9 13 16   Temp: 98.6 F (37 C) 98.1 F (36.7 C) 98.1 F (36.7 C) 97.7 F (36.5 C)  TempSrc: Oral Oral Oral Axillary  SpO2: 97% 96% 98% 99%  Weight:      Height:        Intake/Output Summary (Last 24 hours) at 07/25/2018 1005 Last data filed at 07/25/2018 0953 Gross per 24 hour  Intake 2604.52 ml  Output 3085 ml  Net -480.48 ml   Filed Weights   07/08/18 0600 07/16/18 0423 07/23/18 1016  Weight: 71.2 kg 70.2 kg 65 kg    Telemetry    SR/ST PVC;s occasional  bigeminy and trigeminy    - Personally Reviewed  ECG   N/A  Physical Exam   General appearance: alert and no distress Lungs: clear to auscultation bilaterally Heart: regularly irregular rhythm Extremities: extremities normal, atraumatic, no cyanosis or edema Neurologic: Grossly normal  Labs    Chemistry Recent Labs  Lab 07/21/18 0300 07/22/18 0340 07/24/18 0856 07/25/18 0417  NA 140 143 145 144  K 4.4 4.3 4.2 4.6  CL 112* 115* 113* 110  CO2 20* 22 25 25   GLUCOSE 200* 221* 158* 125*  BUN 33* 39* 44* 45*  CREATININE 1.40* 1.53* 1.46* 1.65*  CALCIUM 8.3* 8.4* 8.1* 8.2*  PROT 5.7*  --   --  6.0*  ALBUMIN 1.8*  --   --  1.8*  AST 55*  --   --  23  ALT 33  --   --  23  ALKPHOS 118  --   --  108  BILITOT 0.6  --   --  0.7  GFRNONAA 48* 43* 46* 39*  GFRAA 56* 50* 53* 46*  ANIONGAP 8 6 7 9      Hematology Recent Labs  Lab 07/24/18 0500 07/24/18 0856 07/25/18 0417  WBC 14.5* 16.8* 15.4*  RBC 2.54*  2.90* 2.90*  HGB 8.2* 8.1* 7.9*  HCT 26.7* 26.4* 26.4*  MCV 105.1* 91.0 91.0  MCH 32.3 27.9 27.2  MCHC 30.7 30.7 29.9*  RDW 16.8* 15.4 15.3  PLT 477* 519* 462*    Cardiac Enzymes Recent Labs  Lab 07/19/18 0432  TROPONINI 0.03*   No results for input(s): TROPIPOC in the last 168 hours.     Radiology    No results found.  Cardiac Studies   Echo 06/20/18  Study Conclusions  - Procedure narrative: Transthoracic echocardiography. Image   quality was poor. The study was technically difficult.   Intravenous contrast (Definity) was administered to opacify the   LV. - Left ventricle: The cavity size was normal. There was mild focal   basal hypertrophy of the septum. Systolic function was normal.   The estimated ejection fraction was in the range of 60% to 65%.   Wall motion was normal; there were no regional wall motion   abnormalities. - Aortic valve: Transvalvular velocity was within the normal range.   There was no stenosis. There was no  regurgitation. - Mitral valve: Transvalvular velocity was within the normal range.   There was no evidence for stenosis. There was trivial   regurgitation. - Right ventricle: The cavity size was normal. Wall thickness was   normal. Systolic function was normal. - Atrial septum: No defect or patent foramen ovale was identified. - Tricuspid valve: There was trivial regurgitation. - Pulmonary arteries: Systolic pressure was within the normal   range. PA peak pressure: 34 mm Hg (S).  Patient Profile     78 y.o. male with a hx of PVOD s/p remote stenting of his left common iliac 20 years ago, carotid artery disease, tobacco abuse, HTN, HLD and diabetes, admitted for elective aortobifemoral bypass w/ post operative course complicated by cholecystitis and colonic ileus, s/p perc chole drain. Now with frequent atrial ectopy/ tachycardia, for which cardiology has been consulted, at the request of [name], Vascular Surgery.    Assessment & Plan    Arrhythmia:  SR/ST with PVCs and PAC's - periods of trigeminy overnight. Myoview in 05/2018 negative for ischemia.  BP normal to low normal. Will give additional 12.5 mg lopressor now, then increase lopressor to 25 mg BID tonight.  LVH:  LVEF 60-65%, normal wall motion.  No change in therapy.     For questions or updates, please contact [place] Please consult [email or web address] for contact info under Cardiology/STEMI.  Pixie Casino, MD, [facility], [place] Director of the [facility] Lipid Disorders &  Cardiovascular Risk Reduction Clinic Diplomate of the American Board of Clinical Lipidology Attending Cardiologist  Direct Dial: [phone]  Fax: [phone]  Website:  [email or web address]  Pixie Casino, MD  07/25/2018, 10:05 AM

## 2018-07-25 NOTE — Progress Notes (Addendum)
[facility] [place]  Subjective  - He is feeling better tolerating PO's.   Objective (!) 120/58 94 98.1 F (36.7 C) (Oral) 13 98%  Intake/Output Summary (Last 24 hours) at 07/25/2018 0734 Last data filed at 07/25/2018 0655 Gross per 24 hour  Intake 2484.52 ml  Output 2435 ml  Net 49.52 ml    Palpable DP B Abdomin NTTP, incision healing well Right groin wound vac in place 50 cc out put last 24 hours SS drainage Heart Tachy 90's to 100's decreased since admission, stable Lungs non labored bresthing  Assessment/Planning: POD # 19 Aortobifemoral Cholecystitis  WBC 16.8 07/24/2018, now 15.4 Afebrile Cholecystitis drain in place maintain at least 6 weeks IR/General surgery following at a distance.  Out patient follow up has been arranged. UO > 2000 last 24 hours, Cr 1.65 HX CKD Will D/C TPN he has tolerated PO diet for 24 hours without N/V He is ambulating daily  [name] 07/25/2018 7:34 AM --  Laboratory Lab Results: Recent Labs    07/24/18 0856 07/25/18 0417  WBC 16.8* 15.4*  HGB 8.1* 7.9*  HCT 26.4* 26.4*  PLT 519* 462*   BMET Recent Labs    07/24/18 0856 07/25/18 0417  NA 145 144  K 4.2 4.6  CL 113* 110  CO2 25 25  GLUCOSE 158* 125*  BUN 44* 45*  CREATININE 1.46* 1.65*  CALCIUM 8.1* 8.2*    COAG Lab Results  Component Value Date   INR 1.25 07/06/2018   INR 1.04 07/01/2018   INR 1.04 09/06/2017   No results found for: PTT

## 2018-07-25 NOTE — Clinical Social Work Note (Signed)
Clinical Social Work Assessment  Patient Details  Name: Christian Bailey MRN: [id number] Date of Birth: 02/08/1941  Date of referral:  07/23/18               Reason for consult:  Facility Placement                Permission sought to share information with:  Facility Sport and exercise psychologist, Family Supports Permission granted to share information::  Yes, Verbal Permission Granted  Name::     [place]::  SNFs  Relationship::  spouse  Contact Information:  [phone]  or  [phone]  Housing/Transportation Living arrangements for the past 2 months:  Single Family Home Source of Information:  Patient Patient Interpreter Needed:  None Criminal Activity/Legal Involvement Pertinent to Current Situation/Hospitalization:  No - Comment as needed Significant Relationships:  Spouse Lives with:  Spouse Do you feel safe going back to the place where you live?  No Need for family participation in patient care:  Yes (Comment)  Care giving concerns:  CSW received consult for discharge needs. CSW spoke with patient's wife  regarding PT recommendation of SNF placement at time of discharge. Patient's wife expressed she wants the patient to get stronger before returning home.      Social Worker assessment / plan:  CSW spoke with patient's spouse concerning possibility of rehab at [facility] before returning home.  CSW provided patient's wife with listing of SNF and explained the insurance approval process.    Employment status:  Retired Forensic scientist:  Medicare PT Recommendations:  [place] / Referral to community resources:  [place]  Patient/Family's Response to care:  Patient recognizes need for rehab before returning home and is agreeable to a SNF placement. Patient reported preference for [facility]   Patient/Family's Understanding of and Emotional Response to Diagnosis, Current Treatment, and Prognosis:  Patient/family  is  realistic regarding therapy needs and expressed being hopeful for SNF placement. Patient's spouse expressed understanding of CSW role and discharge process as well as medical condition. No questions/concerns about plan or treatment at this time.   Emotional Assessment Appearance:  Appears stated age Attitude/Demeanor/Rapport:  Unable to Assess Affect (typically observed):  Unable to Assess Orientation:  Oriented to Self, Oriented to Place, Oriented to  Time, Oriented to Situation Alcohol / Substance use:  Not Applicable Psych involvement (Current and /or in the community):  No (Comment)  Discharge Needs  Concerns to be addressed:  Care Coordination Readmission within the last 30 days:  No Current discharge risk:  Dependent with Mobility Barriers to Discharge:  Continued Medical Work up   [employer], [place] 07/25/2018, 2:52 PM

## 2018-07-25 NOTE — Progress Notes (Signed)
PHARMACY - ADULT TOTAL PARENTERAL NUTRITION CONSULT NOTE   Pharmacy Consult:  TPN Indication: Prolonged ileus  Patient Measurements: Height: 5\' 5"  (165.1 cm) Weight: 143 lb 3.2 oz (65 kg) IBW/kg (Calculated) : 61.5 TPN AdjBW (KG): 68.9 Body mass index is 23.83 kg/m. Usual Weight: 68.9 kg  Assessment:  39 YOM admitted with recurrent claudication s/p aortobifemoral bypass graft and revised right fem-pop bypass graft on 07/06/18. Clears initiated 1/14 and diet was advanced on 1/16 but patient has had poor intake (0-25% recorded). On 1/17, patient began having RUQ abdominal pain with nausea and sluggish bowel sounds. Symptoms continued to worsen POD#10 (1/18) including increased nausea with sensation to vomit and increased abdominal distension. Patient was receiving dextrose in IVF during this time. Patient was made NPO and pharmacy was consulted to manage TPN for prolonged ileus.   GI: prealbumin <5 - up to 9.2, amylase 118, lipase WNL, LBM 1/15, +flatus, right groin drainage 123mL.  PPI, docusate.  1/20 CT - right pelvic RP hematoma bigger 1/21 HIDA positive for cholecystitis >> perc tube placed Denies N/V/abd pain. LBM 1/26  Endo: hx DM on metformin PTA - CBGs controlled Insulin requirements in the past 24 hours: 9 units + 20 units in TPN Lytes: all WNL Renal: AKI resolved - SCr back up to 1.65, BUN 45 - UOP 1.5 ml/kghr Pulm: stable on RA Cards: chronic RBBB, CAD, PAD, HTN, HLD - BP controlled, tachy - Plavix, IV metoprolol, Crestor, PRN hydralazine/metoprolol Hepatobil: LFTs / tbili / TG WNL. Neuro: tramadol, PRN Versed ID: Zosyn (1/19 >> ) for IAI - afebrile, WBC 15.4 stable, no micro TPN Access: PICC 07/16/18 TPN start date: 07/16/18   Nutritional Goals (per RD rec on 1/18): 2000-2200 kCal, 105-125g protein, 2-2.2L fluid per day  Current Nutrition:  TPN Heart healthy, carb modified diet  Plan:  D/c TPN today per Vascular - Reduce TPN rate to 20 ml/hr x 2 hours at 1600, then  stop Change SSI to ACHS D/c TPN labs and nursing orders  Elicia Lamp, PharmD, BCPS Please check AMION for all [place] contact numbers Clinical Pharmacist 07/25/2018 9:25 AM

## 2018-07-26 LAB — BASIC METABOLIC PANEL
Anion gap: 11 (ref 5–15)
BUN: 37 mg/dL — ABNORMAL HIGH (ref 8–23)
CO2: 24 mmol/L (ref 22–32)
Calcium: 7.7 mg/dL — ABNORMAL LOW (ref 8.9–10.3)
Chloride: 106 mmol/L (ref 98–111)
Creatinine, Ser: 1.6 mg/dL — ABNORMAL HIGH (ref 0.61–1.24)
GFR, EST AFRICAN AMERICAN: 47 mL/min — AB (ref >60)
GFR, EST NON AFRICAN AMERICAN: 41 mL/min — AB (ref >60)
Glucose, Bld: 126 mg/dL — ABNORMAL HIGH (ref 70–99)
Potassium: 4.1 mmol/L (ref 3.5–5.1)
Sodium: 141 mmol/L (ref 135–145)

## 2018-07-26 LAB — GLUCOSE, CAPILLARY
Glucose-Capillary: 112 mg/dL — ABNORMAL HIGH (ref 70–99)
Glucose-Capillary: 209 mg/dL — ABNORMAL HIGH (ref 70–99)
Glucose-Capillary: 93 mg/dL (ref 70–99)
Glucose-Capillary: 152 mg/dL — ABNORMAL HIGH (ref 70–99)

## 2018-07-26 LAB — CBC
HCT: 25 % — ABNORMAL LOW (ref 39.0–52.0)
Hemoglobin: 7.5 g/dL — ABNORMAL LOW (ref 13.0–17.0)
MCH: 27.7 pg (ref 26.0–34.0)
MCHC: 30 g/dL (ref 30.0–36.0)
MCV: 92.3 fL (ref 80.0–100.0)
PLATELETS: 396 10*3/uL (ref 150–400)
RBC: 2.71 MIL/uL — ABNORMAL LOW (ref 4.22–5.81)
RDW: 15.1 % (ref 11.5–15.5)
WBC: 12.6 10*3/uL — ABNORMAL HIGH (ref 4.0–10.5)
nRBC: 0 % (ref 0.0–0.2)

## 2018-07-26 NOTE — Progress Notes (Signed)
Patient ambulated twice in the hall on this shift, ambulation well tolerated will continue to monitor.

## 2018-07-26 NOTE — Progress Notes (Addendum)
[facility] [place] in halls, abdomin is non tender, tolerating full diet without pain or nausea.   Objective (!) 123/97 (!) 103 98.2 F (36.8 C) (Oral) 16 99%  Intake/Output Summary (Last 24 hours) at 07/26/2018 0755 Last data filed at 07/26/2018 0043 Gross per 24 hour  Intake 954.57 ml  Output 1850 ml  Net -895.43 ml    Palpable DP pulses B LE Abdomin soft, NTTP Billi drain in place Right groin with out erythema wound vac in place 50 cc SS out put last 24 hours. Heart tachy 100's Lungs non labored breathing  Assessment/Planning: POD # 20 Aortobifemoral Cholecystitis  IV Zosyn will be stopped tomorrow on day 10 Billi bag for total 6 weeks or longer IR/General surgery following as out patient Tolerating PO's well Continue wound vac for right groin incisional protection change 3 times week Pending SNF verses home HGB 7.5, currently asymptomatic will follow up in the am if lower tomorrow will transfuse PRBC.   Saline locked IV fluids.  Cr 1.6  Christian Bailey 07/26/2018 7:55 AM --  Laboratory Lab Results: Recent Labs    07/25/18 0417 07/26/18 0500  WBC 15.4* 12.6*  HGB 7.9* 7.5*  HCT 26.4* 25.0*  PLT 462* 396   BMET Recent Labs    07/25/18 0417 07/26/18 0500  NA 144 141  K 4.6 4.1  CL 110 106  CO2 25 24  GLUCOSE 125* 126*  BUN 45* 37*  CREATININE 1.65* 1.60*  CALCIUM 8.2* 7.7*    COAG Lab Results  Component Value Date   INR 1.25 07/06/2018   INR 1.04 07/01/2018   INR 1.04 09/06/2017   No results found for: PTT  I agree with the above.  The patient is doing much better.  At discharge, he will be transition to Augmentin until his bili drain is removed.  He is now ambulating.  Yesterday PT recommended skilled nursing facility.  Hopefully he will continue to gain strength and be a candidate for inpatient rehab which I think he would greatly benefit from.  He continues to have significant  drainage from his right groin wound.  We will change his wound VAC tomorrow.  White count has normalized.  Christian Bailey

## 2018-07-26 NOTE — Progress Notes (Signed)
Progress Note  Patient Name: Christian Bailey Date of Encounter: 07/26/2018  Primary Cardiologist:   Quay Burow, MD   Subjective   No cardiac complaints - remains tachycardic. Lopressor increased last night.  Inpatient Medications    Scheduled Meds: . clopidogrel  75 mg Oral Daily  . docusate sodium  100 mg Oral Daily  . heparin  5,000 Units Subcutaneous Q8H  . insulin aspart  0-15 Units Subcutaneous TID WC  . insulin aspart  0-5 Units Subcutaneous QHS  . mouth rinse  15 mL Mouth Rinse BID  . metoprolol tartrate  25 mg Oral BID  . mupirocin ointment   Nasal BID  . pantoprazole  40 mg Oral Daily  . rosuvastatin  20 mg Oral QPM  . sodium chloride flush  10-40 mL Intracatheter Q12H  . sodium chloride flush  5 mL Intracatheter Q8H  . traMADol  50 mg Oral Q6H   Continuous Infusions: . sodium chloride    . magnesium sulfate 1 - 4 g bolus IVPB    . piperacillin-tazobactam (ZOSYN)  IV 3.375 g (07/26/18 0607)   PRN Meds: sodium chloride, acetaminophen **OR** acetaminophen, alum & mag hydroxide-simeth, bisacodyl, guaiFENesin-dextromethorphan, magnesium sulfate 1 - 4 g bolus IVPB, metoprolol tartrate, ondansetron, phenol, sodium chloride flush   Vital Signs    Vitals:   07/25/18 2022 07/26/18 0004 07/26/18 0320 07/26/18 0726  BP: (!) 142/65 115/64 123/60 (!) 123/97  Pulse: (!) 101 95 94 (!) 103  Resp: 16   16  Temp: 99.2 F (37.3 C) 98.3 F (36.8 C) 98.3 F (36.8 C) 98.2 F (36.8 C)  TempSrc: Oral Oral Oral Oral  SpO2: 99% 97% 100% 99%  Weight:      Height:        Intake/Output Summary (Last 24 hours) at 07/26/2018 0915 Last data filed at 07/26/2018 0857 Gross per 24 hour  Intake 954.57 ml  Output 1850 ml  Net -895.43 ml   Filed Weights   07/08/18 0600 07/16/18 0423 07/23/18 1016  Weight: 71.2 kg 70.2 kg 65 kg    Telemetry    Sinus tachy with PVC's  - Personally Reviewed  ECG   N/A  Physical Exam   General appearance: alert and no distress Lungs:  clear to auscultation bilaterally Heart: regularly irregular rhythm and tachycardic Extremities: extremities normal, atraumatic, no cyanosis or edema Neurologic: Grossly normal  Labs    Chemistry Recent Labs  Lab 07/21/18 0300  07/24/18 0856 07/25/18 0417 07/26/18 0500  NA 140   < > 145 144 141  K 4.4   < > 4.2 4.6 4.1  CL 112*   < > 113* 110 106  CO2 20*   < > 25 25 24   GLUCOSE 200*   < > 158* 125* 126*  BUN 33*   < > 44* 45* 37*  CREATININE 1.40*   < > 1.46* 1.65* 1.60*  CALCIUM 8.3*   < > 8.1* 8.2* 7.7*  PROT 5.7*  --   --  6.0*  --   ALBUMIN 1.8*  --   --  1.8*  --   AST 55*  --   --  23  --   ALT 33  --   --  23  --   ALKPHOS 118  --   --  108  --   BILITOT 0.6  --   --  0.7  --   GFRNONAA 48*   < > 46* 39* 41*  GFRAA 56*   < >  53* 46* 47*  ANIONGAP 8   < > 7 9 11    < > = values in this interval not displayed.     Hematology Recent Labs  Lab 07/24/18 0856 07/25/18 0417 07/26/18 0500  WBC 16.8* 15.4* 12.6*  RBC 2.90* 2.90* 2.71*  HGB 8.1* 7.9* 7.5*  HCT 26.4* 26.4* 25.0*  MCV 91.0 91.0 92.3  MCH 27.9 27.2 27.7  MCHC 30.7 29.9* 30.0  RDW 15.4 15.3 15.1  PLT 519* 462* 396    Cardiac Enzymes No results for input(s): TROPONINI in the last 168 hours. No results for input(s): TROPIPOC in the last 168 hours.     Radiology    No results found.  Cardiac Studies   Echo 06/20/18  Study Conclusions  - Procedure narrative: Transthoracic echocardiography. Image   quality was poor. The study was technically difficult.   Intravenous contrast (Definity) was administered to opacify the   LV. - Left ventricle: The cavity size was normal. There was mild focal   basal hypertrophy of the septum. Systolic function was normal.   The estimated ejection fraction was in the range of 60% to 65%.   Wall motion was normal; there were no regional wall motion   abnormalities. - Aortic valve: Transvalvular velocity was within the normal range.   There was no stenosis.  There was no regurgitation. - Mitral valve: Transvalvular velocity was within the normal range.   There was no evidence for stenosis. There was trivial   regurgitation. - Right ventricle: The cavity size was normal. Wall thickness was   normal. Systolic function was normal. - Atrial septum: No defect or patent foramen ovale was identified. - Tricuspid valve: There was trivial regurgitation. - Pulmonary arteries: Systolic pressure was within the normal   range. PA peak pressure: 34 mm Hg (S).  Patient Profile     78 y.o. male with a hx of PVOD s/p remote stenting of his left common iliac 20 years ago, carotid artery disease, tobacco abuse, HTN, HLD and diabetes, admitted for elective aortobifemoral bypass w/ post operative course complicated by cholecystitis and colonic ileus, s/p perc chole drain. Now with frequent atrial ectopy/ tachycardia, for which cardiology has been consulted, at the request of [name], Vascular Surgery.    Assessment & Plan    Arrhythmia:  Remains tachycardic today - PVCs are fewer. On increase dose BB - would maintain this for now. He is asymptomatic. Could increase BB further as BP tolerates as an outpatient.  LVH:  LVEF 60-65%, normal wall motion.  No change in therapy.     [facility] will sign off.   Medication Recommendations:  Lopressor 25 mg BID, Plavix 75 mg daily,  Crestor 20 mg daily Other recommendations (labs, testing, etc):  none Follow up as an outpatient:  Dr. Gwenlyn Found   For questions or updates, please contact [place] Please consult [email or web address] for contact info under Cardiology/STEMI.  Pixie Casino, MD, [facility], [place] Director of the Advanced Lipid Disorders &  Cardiovascular Risk Reduction Clinic Diplomate of the American Board of Clinical Lipidology Attending Cardiologist  Direct Dial: [phone]  Fax: [phone]  Website:  [email or web address]  Pixie Casino, MD  07/26/2018,  9:15 AM

## 2018-07-27 LAB — BASIC METABOLIC PANEL
ANION GAP: 8 (ref 5–15)
BUN: 31 mg/dL — ABNORMAL HIGH (ref 8–23)
CO2: 25 mmol/L (ref 22–32)
Calcium: 8 mg/dL — ABNORMAL LOW (ref 8.9–10.3)
Chloride: 109 mmol/L (ref 98–111)
Creatinine, Ser: 1.51 mg/dL — ABNORMAL HIGH (ref 0.61–1.24)
GFR calc Af Amer: 51 mL/min — ABNORMAL LOW (ref >60)
GFR calc non Af Amer: 44 mL/min — ABNORMAL LOW (ref >60)
GLUCOSE: 136 mg/dL — AB (ref 70–99)
Potassium: 4.1 mmol/L (ref 3.5–5.1)
Sodium: 142 mmol/L (ref 135–145)

## 2018-07-27 LAB — GLUCOSE, CAPILLARY
GLUCOSE-CAPILLARY: 112 mg/dL — AB (ref 70–99)
Glucose-Capillary: 165 mg/dL — ABNORMAL HIGH (ref 70–99)
Glucose-Capillary: 125 mg/dL — ABNORMAL HIGH (ref 70–99)
Glucose-Capillary: 136 mg/dL — ABNORMAL HIGH (ref 70–99)

## 2018-07-27 LAB — CBC
HCT: 25.9 % — ABNORMAL LOW (ref 39.0–52.0)
Hemoglobin: 8 g/dL — ABNORMAL LOW (ref 13.0–17.0)
MCH: 28.3 pg (ref 26.0–34.0)
MCHC: 30.9 g/dL (ref 30.0–36.0)
MCV: 91.5 fL (ref 80.0–100.0)
Platelets: 405 10*3/uL — ABNORMAL HIGH (ref 150–400)
RBC: 2.83 MIL/uL — AB (ref 4.22–5.81)
RDW: 14.6 % (ref 11.5–15.5)
WBC: 10.7 10*3/uL — ABNORMAL HIGH (ref 4.0–10.5)
nRBC: 0 % (ref 0.0–0.2)

## 2018-07-27 NOTE — Progress Notes (Signed)
Occupational Therapy Treatment Patient Details Name: Christian Bailey MRN: [id number] DOB: 1941-05-21 Today's Date: 07/27/2018    History of present illness Pt s/p aortobifemoral bypass and redo of rt fem pop bypass on 07/06/18. Hospital course complicated by ileus, NGT place, and cholecystitis. Percutaneous chole drain placed 1/21. PMH - rt reverse total shoulder, HTN, DM, OA, PAD, lower extremity bypass   OT comments  Pt making good progress with functional goals and is awaiting ST SNF placement  for rehab before returning home. OT will continue to follow acutely  Follow Up Recommendations  SNF;Supervision/Assistance - 24 hour    Equipment Recommendations  Other (comment)(TBD at next venue of care)    Recommendations for Other Services      Precautions / Restrictions Precautions Precautions: Fall Precaution Comments:  chole drain Restrictions Weight Bearing Restrictions: No       Mobility Bed Mobility               General bed mobility comments: pt in recliner upon arrival  Transfers Overall transfer level: Needs assistance Equipment used: Rolling walker (2 wheeled) Transfers: Sit to/from Stand Sit to Stand: Min guard         General transfer comment: Cues for hand placement    Balance Overall balance assessment: Needs assistance Sitting-balance support: No upper extremity supported;Feet supported Sitting balance-Leahy Scale: Good     Standing balance support: Bilateral upper extremity supported;During functional activity Standing balance-Leahy Scale: Poor                             ADL either performed or assessed with clinical judgement   ADL Overall ADL's : Needs assistance/impaired     Grooming: Wash/dry hands;Wash/dry face;Standing;Min guard       Lower Body Bathing: Sit to/from stand;Moderate assistance Lower Body Bathing Details (indicate cue type and reason): simulated         Toilet Transfer: Total  assistance;Ambulation;RW;Comfort height toilet;Grab bars   Toileting- Clothing Manipulation and Hygiene: Sit to/from stand;Minimal assistance       Functional mobility during ADLs: Rolling walker;Min guard General ADL Comments: Pt with improved activity tolerance this visit.     Vision Baseline Vision/History: Wears glasses;Macular Degeneration Wears Glasses: At all times Patient Visual Report: No change from baseline     Perception     Praxis      Cognition Arousal/Alertness: Awake/alert Behavior During Therapy: WFL for tasks assessed/performed Overall Cognitive Status: Within Functional Limits for tasks assessed                                          Exercises     Shoulder Instructions       General Comments      Pertinent Vitals/ Pain       Pain Assessment: 0-10 Pain Score: 3  Faces Pain Scale: Hurts a little bit Pain Location: abdomen Pain Descriptors / Indicators: Guarding;Discomfort Pain Intervention(s): Monitored during session  Home Living                                          Prior Functioning/Environment              Frequency  Min 2X/week        Progress Toward Goals  OT Goals(current goals can now be found in the care plan section)  Progress towards OT goals: Progressing toward goals  Acute Rehab OT Goals Patient Stated Goal: to get stronger  Plan Discharge plan needs to be updated    Co-evaluation                 AM-PAC OT "6 Clicks" Daily Activity     Outcome Measure     Help from another person taking care of personal grooming?: A Little Help from another person toileting, which includes using toliet, bedpan, or urinal?: A Lot Help from another person bathing (including washing, rinsing, drying)?: A Lot Help from another person to put on and taking off regular upper body clothing?: A Lot Help from another person to put on and taking off regular lower body clothing?: A Lot 6  Click Score: 11    End of Session Equipment Utilized During Treatment: Gait belt;Rolling walker      Activity Tolerance Patient tolerated treatment well   Patient Left Other (comment)( ambulating with PT)   Nurse Communication          Time: 1194-1740 OT Time Calculation (min): 12 min  Charges: OT General Charges $OT Visit: 1 Visit OT Treatments $Self Care/Home Management : 8-22 mins     Emmit [facility]ers [facility] 07/27/2018, 10:44 AM

## 2018-07-27 NOTE — Progress Notes (Addendum)
[facility] [place]  Subjective  - Mild right abdominal pain when he skipped pain medication yesterday.  After pain meds the abdominal pain is gone.   Objective 120/60 93 97.7 F (36.5 C) (Oral) 15 99%  Intake/Output Summary (Last 24 hours) at 07/27/2018 0735 Last data filed at 07/27/2018 0277 Gross per 24 hour  Intake 740 ml  Output 1482 ml  Net -742 ml    Palpable DP B LE Abdomin soft NTTP this am, incision healing well. Right groin wound vac removed, incision healing well without drainage.  0 drainage  Heart 93 bpm, BP 120/60 Lungs non labored breathing  Assessment/Planning: POD # 21 Aortobifemoral Cholecystitis  Wound vac without drainage since Monday.  Drain out put recording was from [employer].  Wound vac removed and right groin left open to air for trial.     Last dose of IV antibiotics today. Pending SNF placement Billi bag for total 6 weeks or longer IR/General surgery following as out patient HGB improved now 8.0 Cr 1.51, UO 1400 last 24 hours WBC continues to improve now 10.7   [name] 07/27/2018 7:35 AM --  Laboratory Lab Results: Recent Labs    07/26/18 0500 07/27/18 0506  WBC 12.6* 10.7*  HGB 7.5* 8.0*  HCT 25.0* 25.9*  PLT 396 405*   BMET Recent Labs    07/26/18 0500 07/27/18 0506  NA 141 142  K 4.1 4.1  CL 106 109  CO2 24 25  GLUCOSE 126* 136*  BUN 37* 31*  CREATININE 1.60* 1.51*  CALCIUM 7.7* 8.0*    COAG Lab Results  Component Value Date   INR 1.25 07/06/2018   INR 1.04 07/01/2018   INR 1.04 09/06/2017   No results found for: PTT  I agree with the above.  I have seen and evaluated the patient.  [name]

## 2018-07-27 NOTE — Progress Notes (Signed)
Physical Therapy Treatment Patient Details Name: Christian Bailey MRN: [id number] DOB: [birth year] Today's Date: 07/27/2018    History of Present Illness Pt s/p aortobifemoral bypass and redo of rt fem pop bypass on 07/06/18. Hospital course complicated by ileus, NGT place, and cholecystitis. Percutaneous chole drain placed 1/21. PMH - rt reverse total shoulder, HTN, DM, OA, PAD, lower extremity bypass    PT Comments    Pt continues to maintain good progression towards his physical therapy goals. Increased ambulation distance to 90 feet x 2 using walker, requiring one standing rest break. Continues with weakness and decreased endurance. HR peak at 124 bpm.     Follow Up Recommendations  SNF     Equipment Recommendations  Other (comment)(rollator)    Recommendations for Other Services       Precautions / Restrictions Precautions Precautions: Fall Precaution Comments:  chole drain Restrictions Weight Bearing Restrictions: No    Mobility  Bed Mobility               General bed mobility comments: Received out of bed with OT  Transfers Overall transfer level: Needs assistance Equipment used: Rolling walker (2 wheeled) Transfers: Sit to/from Stand Sit to Stand: Min guard         General transfer comment: Cues for hand placement  Ambulation/Gait Ambulation/Gait assistance: Min guard Gait Distance (Feet): 90 Feet(x2) Assistive device: Rolling walker (2 wheeled) Gait Pattern/deviations: Step-through pattern;Decreased stride length;Trunk flexed Gait velocity: decreased Gait velocity interpretation: <1.8 ft/sec, indicate of risk for recurrent falls General Gait Details: Cues for upright posture and walker positioning. Required one standing rest break   Stairs             Wheelchair Mobility    Modified Rankin (Stroke Patients Only)       Balance Overall balance assessment: Needs assistance Sitting-balance support: No upper extremity supported;Feet  supported Sitting balance-Leahy Scale: Good     Standing balance support: Bilateral upper extremity supported Standing balance-Leahy Scale: Poor                              Cognition Arousal/Alertness: Awake/alert Behavior During Therapy: WFL for tasks assessed/performed Overall Cognitive Status: Within Functional Limits for tasks assessed                                        Exercises      General Comments        Pertinent Vitals/Pain Pain Assessment: Faces Faces Pain Scale: Hurts a little bit Pain Location: abdomen Pain Descriptors / Indicators: Guarding Pain Intervention(s): Monitored during session    Home Living                      Prior Function            PT Goals (current goals can now be found in the care plan section) Acute Rehab PT Goals Patient Stated Goal: to get stronger Potential to Achieve Goals: Good Progress towards PT goals: Progressing toward goals    Frequency    Min 3X/week      PT Plan Current plan remains appropriate    Co-evaluation              AM-PAC PT "6 Clicks" Mobility   Outcome Measure  Help needed turning from your back to your side while in a flat  bed without using bedrails?: None Help needed moving from lying on your back to sitting on the side of a flat bed without using bedrails?: A Little Help needed moving to and from a bed to a chair (including a wheelchair)?: A Little Help needed standing up from a chair using your arms (e.g., wheelchair or bedside chair)?: A Little Help needed to walk in hospital room?: A Little Help needed climbing 3-5 steps with a railing? : A Little 6 Click Score: 19    End of Session Equipment Utilized During Treatment: Gait belt Activity Tolerance: Patient tolerated treatment well Patient left: in chair;with call bell/phone within reach Nurse Communication: Mobility status PT Visit Diagnosis: Other abnormalities of gait and mobility  (R26.89)     Time: 2035-5974 PT Time Calculation (min) (ACUTE ONLY): 18 min  Charges:  $Gait Training: 8-22 mins                    Ellamae Sia, PT, DPT [place] Pager [phone] Office [phone]   Willy Eddy 07/27/2018, 10:29 AM

## 2018-07-27 NOTE — Progress Notes (Signed)
Rehab Admissions Coordinator Note:  Patient was screened by Cleatrice Burke for appropriateness for an Inpatient Acute Rehab Consult per request RN CM, Steffanie Dunn.  At this time, we are recommending [facility]. Patient has had a decline in function which therapy initially recommended [facility] but now recommends SNF. Patient is overall min guard assist level with PT and OT. Pt not in need of an intensive inpt rehab admission at this level. I concur with SNF rehab at today's  functional level.   Cleatrice Burke MSN 07/27/2018, 11:32 AM  I can be reached at [phone].

## 2018-07-28 LAB — CBC
HCT: 25.9 % — ABNORMAL LOW (ref 39.0–52.0)
Hemoglobin: 7.9 g/dL — ABNORMAL LOW (ref 13.0–17.0)
MCH: 28 pg (ref 26.0–34.0)
MCHC: 30.5 g/dL (ref 30.0–36.0)
MCV: 91.8 fL (ref 80.0–100.0)
Platelets: 394 10*3/uL (ref 150–400)
RBC: 2.82 MIL/uL — ABNORMAL LOW (ref 4.22–5.81)
RDW: 14.6 % (ref 11.5–15.5)
WBC: 9.9 10*3/uL (ref 4.0–10.5)
nRBC: 0 % (ref 0.0–0.2)

## 2018-07-28 LAB — BASIC METABOLIC PANEL
ANION GAP: 9 (ref 5–15)
BUN: 27 mg/dL — ABNORMAL HIGH (ref 8–23)
CO2: 26 mmol/L (ref 22–32)
Calcium: 7.9 mg/dL — ABNORMAL LOW (ref 8.9–10.3)
Chloride: 105 mmol/L (ref 98–111)
Creatinine, Ser: 1.5 mg/dL — ABNORMAL HIGH (ref 0.61–1.24)
GFR calc Af Amer: 51 mL/min — ABNORMAL LOW (ref >60)
GFR calc non Af Amer: 44 mL/min — ABNORMAL LOW (ref >60)
GLUCOSE: 112 mg/dL — AB (ref 70–99)
Potassium: 3.8 mmol/L (ref 3.5–5.1)
Sodium: 140 mmol/L (ref 135–145)

## 2018-07-28 LAB — GLUCOSE, CAPILLARY
Glucose-Capillary: 101 mg/dL — ABNORMAL HIGH (ref 70–99)
Glucose-Capillary: 150 mg/dL — ABNORMAL HIGH (ref 70–99)
Glucose-Capillary: 75 mg/dL (ref 70–99)
Glucose-Capillary: 133 mg/dL — ABNORMAL HIGH (ref 70–99)

## 2018-07-28 MED ORDER — GLUCERNA SHAKE PO LIQD
237.0000 mL | Freq: Three times a day (TID) | ORAL | Status: DC
  Administered 2018-07-28 – 2018-07-29 (×3): 237 mL via ORAL

## 2018-07-28 MED ORDER — AMOXICILLIN-POT CLAVULANATE 875-125 MG PO TABS
1.0000 | ORAL_TABLET | Freq: Two times a day (BID) | ORAL | Status: DC
  Administered 2018-07-28 – 2018-07-30 (×5): 1 via ORAL
  Filled 2018-07-28 (×6): qty 1

## 2018-07-28 NOTE — Progress Notes (Addendum)
[facility] [place]  Subjective  - Doing better slowly   Objective (!) 123/57 86 98 F (36.7 C) (Oral) 17 95%  Intake/Output Summary (Last 24 hours) at 07/28/2018 0745 Last data filed at 07/28/2018 0514 Gross per 24 hour  Intake 988.71 ml  Output 930 ml  Net 58.71 ml    Palpable DP B Abdomin soft, NTTP Groins healing well , no drainage right groin clean and dry Billi bag intact Heart RRR 86 BPM Lungs non labored breathing  Assessment/Planning: POD # 22 Aortobifemoral Cholecystitis  Supine to sit independently  Tolerating full diet without abdominal pain Heart rate improved and stable IV antibiotics stopped yesterday WBC WNL 9.9 UO 825cc recorded Billi bag flushed with 5 cc NS and emptied Q8 hours. Ambulating < 200 ft at a time, deconditioned secondary to illness and long stay in hospital since surgery.  PT recommending SNF still. CIR also screened him and recommending SNF.   Cardiology following for tachycardia Lopressor 25 mg BID, Plavix 75 mg daily,  Crestor 20 mg daily.     [name] 07/28/2018 7:45 AM --  Laboratory Lab Results: Recent Labs    07/27/18 0506 07/28/18 0457  WBC 10.7* 9.9  HGB 8.0* 7.9*  HCT 25.9* 25.9*  PLT 405* 394   BMET Recent Labs    07/27/18 0506 07/28/18 0457  NA 142 140  K 4.1 3.8  CL 109 105  CO2 25 26  GLUCOSE 136* 112*  BUN 31* 27*  CREATININE 1.51* 1.50*  CALCIUM 8.0* 7.9*    COAG Lab Results  Component Value Date   INR 1.25 07/06/2018   INR 1.04 07/01/2018   INR 1.04 09/06/2017   No results found for: PTT  Hopefully will be able to go home at discharge Keep on Augmentin until billi drain is out, given new dacron aortic graft.  Christian Bailey

## 2018-07-28 NOTE — Progress Notes (Signed)
Nutrition Follow Up  DOCUMENTATION CODES:   Not applicable  INTERVENTION:    Glucerna Shake po TID, each supplement provides 220 kcal and 10 grams of protein  NEW NUTRITION DIAGNOSIS:   Increased nutrient needs related to acute illness, wound healing as evidenced by estimated nutrition needs, ongoing  GOAL:   Patient will meet greater than or equal to 90% of their needs, progressing  MONITOR:   PO intake, Supplement acceptance, Labs, Skin, Weight trends, I & O's  ASSESSMENT:  78 y/o male PMHx DM2, HTN/HLD, CAD, PVD. Initially presented to hospital for revision of femoral-popliteal bypass graft, done 1/8. Post-op patient has been slow to progress. RD consulted for TPN initiation d/t prolonged diet intolerance/ileus.   1/14 Clear Liquid diet 1/16 HH/Carbohydrate Modified diet 1/18 NPO, TPN started via PICC  1/19 NGT placed for decompression  1/21 s/p perc chole drain by IR  NGT removed per [name] 1/24. Pt advanced to Clear Liquids 1/25. TPN discontinued 1/27.  Pt now on a Heart Healthy/Carbohydrate Modified diet. PO intake has been variable at 25-100% per flowsheets. Would benefit from a supplement to help meet nutrition needs.  Labs & medications reviewed. CBG's 136-101-150.  Diet Order:   Diet Order            Diet heart healthy/carb modified Room service appropriate? Yes; Fluid consistency: Thin  Diet effective now             EDUCATION NEEDS:   No education needs have been identified at this time  Skin:  Wound VAC to R groin  Last BM:  1/30  Height:   Ht Readings from Last 1 Encounters:  07/06/18 5\' 5"  (1.651 m)   Weight:   Wt Readings from Last 1 Encounters:  07/23/18 65 kg   Ideal Body Weight:  61.82 kg  BMI:  Body mass index is 23.83 kg/m.  Estimated Nutritional Needs:   Kcal:  2000-2200   Protein:  105-120 gm   Fluid:  2.0-2.2 L  Arthur Holms, RD, LDN Pager #: [phone] After-Hours Pager #: [phone]

## 2018-07-28 NOTE — Discharge Instructions (Signed)
[facility]  Discharge Instructions   Open Aortic Surgery  Please refer to the following instructions for your post-procedure care. Your surgeon or Physician Assistant will discuss any changes with you.  Activity  Avoid lifting more than eight pounds (a gallon of milk) until after your first post-operative visit. You are encouraged to walk as much as you can. You can slowly return to normal activities but must avoid strenuous activity and heavy lifting until your doctor tells you it's OK. Heavy lifting can hurt the incision and cause a hernia. Avoid activities such as vacuuming or swinging a golf club. It is normal to feel tired for several weeks after your surgery. Do not drive until your doctor gives the OK and you are no longer taking prescription pain medications. It is also normal to have difficulty with sleep habits, eating and bowl movements after surgery. These will go away with time.  Bathing/Showering  You may shower after you go home. Do not soak in a bathtub, hot tub, or swim until the incision heals.  Incision Care  Shower every day. Clean your incision with mild soap and water. Pat the area dry with a clean towel. You do not need a bandage unless otherwise instructed. Do not apply any ointments or creams to your incision. You may have skin glue on your incision. Do not peel it off. It will come off on its own in about one week. If you have staples or sutures along your incision, they will be removed at your post op appointment.  Diet  Resume your normal diet. There are no special food restriction following this procedure. A low fat/low cholesterol diet is recommended for all patients with vascular disease. After your aortic surgery, it's normal to feel full faster than usual and to not feel as hungry as you normally would. You will probably lose weight initially following your surgery. It's best to eat small, frequent meals over the course of  the day. Call the office if you find that you are unable to eat even small meals. In order to heal from your surgery, it is CRITICAL to get adequate nutrition. Your body requires vitamins, minerals, and protein. Vegetables are the best source of vitamins and minerals. causing pain, you may take over-the-counter pain reliever such as acetaminophen (Tylenol). If you were prescribed a stronger pain medication, please be aware these medication can cause nausea and constipation. Prevent nausea by taking the medication with a snack or meal. Avoid constipation by drinking plenty of fluids and eating foods with a high amount of fiber, such as fruits, vegetables and grains. Take 100mg  of the over-the-counter stool softener Colace twice a day as needed to help with constipation. A laxative, such as Milk of Magnesia, may be recommended for you at this time. Do not take a laxative unless your surgeon or P.A. tells you it's OK. Do not take Tylenol if you are taking stronger pain medications (such as Percocet).  Follow Up  Our office will schedule a follow up appointment 2-3 weeks after discharge.  Please call us immediately for any of the following conditions    .     Severe or worsening pain in your legs or feet or in your abdomen back or chest. Increased pain, redness drainage (pus) from your incision site. Increased abdominal pain, bloating, nausea, vomiting, or persistent diarrhea. Fever of 101 degrees or higher. Swelling in your leg (s).  Reduce your risk of vascular disease  Stop smoking.  If you would like help, call [facility] at 1-800-QUIT-NOW (805)764-3977) or [place] at (661)353-6004. Manage your cholesterol Maintain a desired weight Control your diabetes Keep your blood pressure down  If you have any questions please call the office at [phone]. Please call radiology with any issues related to your drain

## 2018-07-28 NOTE — Plan of Care (Signed)

## 2018-07-28 NOTE — Plan of Care (Signed)
  Problem: Health Behavior/Discharge Planning: Goal: Ability to manage health-related needs will improve Outcome: Progressing   Problem: Clinical Measurements: Goal: Will remain free from infection Outcome: Progressing Goal: Respiratory complications will improve Outcome: Progressing Goal: Cardiovascular complication will be avoided Outcome: Progressing   Problem: Activity: Goal: Risk for activity intolerance will decrease Outcome: Progressing

## 2018-07-29 LAB — GLUCOSE, CAPILLARY
GLUCOSE-CAPILLARY: 106 mg/dL — AB (ref 70–99)
Glucose-Capillary: 131 mg/dL — ABNORMAL HIGH (ref 70–99)
Glucose-Capillary: 166 mg/dL — ABNORMAL HIGH (ref 70–99)
Glucose-Capillary: 97 mg/dL (ref 70–99)

## 2018-07-29 MED ORDER — METOPROLOL TARTRATE 25 MG PO TABS: 25.0000 mg | ORAL_TABLET | Freq: Two times a day (BID) | ORAL | 0 refills | Status: DC

## 2018-07-29 MED ORDER — TRAMADOL HCL 50 MG PO TABS: 50.0000 mg | ORAL_TABLET | Freq: Four times a day (QID) | ORAL | 0 refills | Status: DC | PRN

## 2018-07-29 MED ORDER — AMOXICILLIN-POT CLAVULANATE 875-125 MG PO TABS: 1.0000 | ORAL_TABLET | Freq: Two times a day (BID) | ORAL | 2 refills | Status: DC

## 2018-07-29 NOTE — Progress Notes (Signed)
Physical Therapy Treatment Patient Details Name: Christian Bailey MRN: [id number] DOB: 1940/11/20 Today's Date: 07/29/2018    History of Present Illness Pt s/p aortobifemoral bypass and redo of rt fem pop bypass on 07/06/18. Hospital course complicated by ileus, NGT place, and cholecystitis. Percutaneous chole drain placed 1/21. PMH - rt reverse total shoulder, HTN, DM, OA, PAD, lower extremity bypass    PT Comments    Pt has made significant improvement towards physical therapy goals. Session focused on stair training prior to discharge. Pt requiring minA with left railing to negotiate 4 steps to simulate home environment. HR 94-122 bpm. Instructed pt on proper guarding technique for pt wife to perform; pt verbalized understanding. Updated d/c plan to HHPT.    Follow Up Recommendations  Home health PT     Equipment Recommendations  Other (comment)(rollator)    Recommendations for Other Services       Precautions / Restrictions Precautions Precautions: Fall Precaution Comments:  chole drain Restrictions Weight Bearing Restrictions: No    Mobility  Bed Mobility               General bed mobility comments: Received in recliner  Transfers Overall transfer level: Needs assistance Equipment used: 4-wheeled walker Transfers: Sit to/from Stand Sit to Stand: Supervision Stand pivot transfers: Min guard       General transfer comment: CUES FOR HAND PLACEMENT  Ambulation/Gait Ambulation/Gait assistance: Supervision Gait Distance (Feet): 150 Feet Assistive device: 4-wheeled walker Gait Pattern/deviations: Step-through pattern;Decreased stride length;Trunk flexed Gait velocity: decreased   General Gait Details: Improved gait speed and endurance   Stairs Stairs: Yes Stairs assistance: Min assist Stair Management: One rail Left Number of Stairs: 4 General stair comments: cues for step by step pattern. minA due to mild unsteadiness with descending   Wheelchair  Mobility    Modified Rankin (Stroke Patients Only)       Balance Overall balance assessment: Needs assistance Sitting-balance support: No upper extremity supported;Feet supported Sitting balance-Leahy Scale: Good     Standing balance support: Bilateral upper extremity supported Standing balance-Leahy Scale: Fair                              Cognition Arousal/Alertness: Awake/alert Behavior During Therapy: WFL for tasks assessed/performed Overall Cognitive Status: Within Functional Limits for tasks assessed                                        Exercises      General Comments        Pertinent Vitals/Pain Pain Assessment: Faces Pain Score: 2  Faces Pain Scale: No hurt Pain Location: abdomen Pain Descriptors / Indicators: Aching Pain Intervention(s): Limited activity within patient's tolerance;Monitored during session;Premedicated before session    Home Living                      Prior Function            PT Goals (current goals can now be found in the care plan section) Acute Rehab PT Goals Patient Stated Goal: to get stronger Potential to Achieve Goals: Good Progress towards PT goals: Progressing toward goals    Frequency    Min 3X/week      PT Plan Discharge plan needs to be updated    Co-evaluation  AM-PAC PT "6 Clicks" Mobility   Outcome Measure  Help needed turning from your back to your side while in a flat bed without using bedrails?: None Help needed moving from lying on your back to sitting on the side of a flat bed without using bedrails?: A Little Help needed moving to and from a bed to a chair (including a wheelchair)?: A Little Help needed standing up from a chair using your arms (e.g., wheelchair or bedside chair)?: A Little Help needed to walk in hospital room?: A Little Help needed climbing 3-5 steps with a railing? : A Little 6 Click Score: 19    End of Session Equipment  Utilized During Treatment: Gait belt Activity Tolerance: Patient tolerated treatment well Patient left: in chair;with call bell/phone within reach Nurse Communication: Mobility status PT Visit Diagnosis: Other abnormalities of gait and mobility (R26.89)     Time: 2202-5427 PT Time Calculation (min) (ACUTE ONLY): 17 min  Charges:  $Gait Training: 8-22 mins                     Ellamae Sia, PT, DPT [place] Pager [phone] Office [phone]    Willy Eddy 07/29/2018, 11:59 AM

## 2018-07-29 NOTE — Plan of Care (Signed)
  Problem: Health Behavior/Discharge Planning: Goal: Ability to manage health-related needs will improve Outcome: Progressing   Problem: Clinical Measurements: Goal: Ability to maintain clinical measurements within normal limits will improve Outcome: Progressing Goal: Will remain free from infection Outcome: Progressing Goal: Diagnostic test results will improve Outcome: Progressing Goal: Respiratory complications will improve Outcome: Progressing Goal: Cardiovascular complication will be avoided Outcome: Progressing   Problem: Activity: Goal: Risk for activity intolerance will decrease Outcome: Progressing   Problem: Nutrition: Goal: Adequate nutrition will be maintained Outcome: Progressing   Problem: Coping: Goal: Level of anxiety will decrease Outcome: Progressing   Problem: Elimination: Goal: Will not experience complications related to bowel motility Outcome: Progressing Goal: Will not experience complications related to urinary retention Outcome: Progressing   Problem: Pain Managment: Goal: General experience of comfort will improve Outcome: Progressing   

## 2018-07-29 NOTE — Discharge Summary (Signed)
[facility] Discharge Summary   Patient ID:  Christian Bailey MRN: [id number] DOB/AGE: [birth year] 78 y.o.  Admit date: 07/06/2018 Discharge date: 08/02/2018 Date of Surgery: 07/06/2018 Surgeon: Surgeon(s): Serafina Mitchell, MD  Admission Diagnosis: PERIPHERAL VASCULAR DISEASE WITH CLAUDICATION  Discharge Diagnoses:  PERIPHERAL VASCULAR DISEASE WITH CLAUDICATION  Secondary Diagnoses: Past Medical History:  Diagnosis Date  . Anemia   . Complication of anesthesia   . Diabetes mellitus ([place])    TYPE 2  . GERD (gastroesophageal reflux disease)   . History of blood transfusion    GI bleed  . History of colon polyps   . History of hiatal hernia   . Hyperlipemia   . Hypertension   . Osteoarthritis   . PAD (peripheral artery disease) (HCC)    a. stenting of his left common iliac artery >20 years ago. b. h/o LEIA stent and 2 stents to R SFA in 2011. c. 04/2014:  s/p PTA of right SFA for in-stent restenosis, occluded left SFA  . PONV (postoperative nausea and vomiting)    no porblem with the last 3 surgeries  . RBBB (right bundle branch block with left anterior fascicular block)    NUCLEAR STRESS TEST, 08/18/2010 - no significant wall motion abnoramlities noted, post-stress EF 69%, normal myocardial perfusion study  . Sinus tachycardia    a. Noted during admission 04/2014 but upon review seems to be frequent finding for patient.  . Stenosis of carotid artery    a. 50% right carotid stenosis by angiogram 04/2014.    Procedure(s): AORTA BIFEMORAL BYPASS GRAFT USING 14X7MM X 40CM HEMASHIELD GOLD GRAFT REVISION RIGHT FEMORAL TO POPLITEAL ARTERY BYPASS GRAFT  Discharged Condition: stable  HPI: Christian Bailey a [phone] is status post right femoral-popliteal bypass graft with vein on 09/18/2016. This was done for claudication. Postoperatively, the patient's ABIs were below what I thought they should be and therefore he underwent angiography which revealed a widely  patent bypass graft and no evidence for the reason why the ABIs were low. He returned home and his walking improve  He recently felt that his right leg was feeling the way did before surgery. Therefore an 01/27/2017 he was taken for angiography. Approximately 60% stenosis of the common femoral and proximal right femoral-popliteal bypass graft was identified. These were successfully treated using Orbitalatherectomy and a 5 mm drug coated balloon.  His symptoms recurred. And therefore, on 09/08/2017 he underwent revision of his bypass graft. This was because of a high-grade stenosis in the proximal portion. Intraoperatively he had very dense scar tissue. I had to resect the diseased vein. I looked for other pieces of vein to serve as an interposition graft but he did not have suitable conduit. I therefore elected to resect the proximal portion of the superficial femoral artery and endarterectomized this and use this as an interposition graft between the bovine pericardial patch and the distal vein graft. He is back today for follow-up.His symptoms are on changed and he is having trouble getting to the mailbox because of pain in his legs. He does not have any open wounds. He was scheduled for Aortobifemoral bypass.  Hospital Course:  Christian Bailey is a 78 y.o. male is S/P Procedure(s): AORTA BIFEMORAL BYPASS GRAFT USING 14X7MM X 40CM HEMASHIELD GOLD GRAFT REVISION RIGHT FEMORAL TO POPLITEAL ARTERY BYPASS GRAFT  Post operatively he was slow to progress.  He had a complaint of abdominal pain and distention.  NG tube was discharge on POD# 2.  He developed acute blood  loss anemia. He got 2 units PRBC on 07/06/2018 then 1 unit 07/14/2018.    CT Abd/Pelvis 10.0 x 10.0 x 6.1 by 12.9 cm hematoma left groin not expanding, left groin without hematoma.  Abdominal pain moved to right UQ.  Mild inflammatory changes surrounding the gallbladder.  Ultrasound ordered confirmed Cholecystitis.    IR placed        Percutaneous cholecystostomy catheter placement 07/19/2018.  Patient was having right groin SS drainage with incisional wound vac.  Wound vac was discharged on POD# 21 with right groin incision clean and dry.    Tachycardia addressed with the help of cardiology consult.  Recommendation Lopressor 25 mg BID.    History of anemia and GI bleed post transfusion 3 units PRBC.    F/U arranged with IR/Genral surgery for cholecystitis, billi bag care per RN home health.  PT ordered for [facility].  Oral antibiotics sent to pharmacy Augmentin daily PO.      Significant Diagnostic Studies: CBC Lab Results  Component Value Date   WBC 9.9 07/28/2018   HGB 7.9 (L) 07/28/2018   HCT 25.9 (L) 07/28/2018   MCV 91.8 07/28/2018   PLT 394 07/28/2018    BMET    Component Value Date/Time   NA 140 07/28/2018 0457   K 3.8 07/28/2018 0457   CL 105 07/28/2018 0457   CO2 26 07/28/2018 0457   GLUCOSE 112 (H) 07/28/2018 0457   BUN 27 (H) 07/28/2018 0457   CREATININE 1.50 (H) 07/28/2018 0457   CREATININE 1.33 (H) 04/10/2016 0851   CALCIUM 7.9 (L) 07/28/2018 0457   GFRNONAA 44 (L) 07/28/2018 0457   GFRAA 51 (L) 07/28/2018 0457   COAG Lab Results  Component Value Date   INR 1.25 07/06/2018   INR 1.04 07/01/2018   INR 1.04 09/06/2017     Disposition:  Discharge to :Home Discharge Instructions    Call MD for:  redness, tenderness, or signs of infection (pain, swelling, bleeding, redness, odor or green/yellow discharge around incision site)   Complete by:  As directed    Call MD for:  severe or increased pain, loss or decreased feeling  in affected limb(s)   Complete by:  As directed    Call MD for:  temperature >100.5   Complete by:  As directed    Resume previous diet   Complete by:  As directed      Allergies as of 07/30/2018      Reactions   Asa [aspirin] Other (See Comments)   GI bleeding   Gadolinium Derivatives Other (See Comments)   Pt complained of face flushing/hottness and throat  tightness/scratchiness immediately after the injections.  Within 4 minutes, all symptoms were gone and the study was completed.  No further complications or signs of allergy were exhibited after completion of study.    Oxycodone-acetaminophen Other (See Comments)   Dizziness, uncomfortable       Medication List    STOP taking these medications   amoxicillin 500 MG capsule Commonly known as:  AMOXIL   metoprolol succinate 25 MG 24 hr tablet Commonly known as:  TOPROL-XL     TAKE these medications   amoxicillin-clavulanate 875-125 MG tablet Commonly known as:  AUGMENTIN Take 1 tablet by mouth every 12 (twelve) hours.   clopidogrel 75 MG tablet Commonly known as:  PLAVIX Take 1 tablet (75 mg total) by mouth daily.   COQ10 PO Take 1 capsule by mouth every evening.   Fish Oil 1200 MG Caps Take 1,200 mg  by mouth every evening.   glucosamine-chondroitin 500-400 MG tablet Take 1 tablet by mouth every evening.   lisinopril 5 MG tablet Commonly known as:  PRINIVIL,ZESTRIL Take 5 mg by mouth 2 (two) times daily.   metFORMIN 1000 MG tablet Commonly known as:  GLUCOPHAGE Take 1 tablet (1,000 mg total) by mouth 2 (two) times daily with a meal.   metoprolol tartrate 25 MG tablet Commonly known as:  LOPRESSOR Take 1 tablet (25 mg total) by mouth 2 (two) times daily.   pantoprazole 40 MG tablet Commonly known as:  PROTONIX Take 40 mg by mouth twice daily What changed:    how much to take  how to take this  when to take this  additional instructions   rosuvastatin 20 MG tablet Commonly known as:  CRESTOR Take 20 mg by mouth every evening.   traMADol 50 MG tablet Commonly known as:  ULTRAM Take 1 tablet (50 mg total) by mouth every 6 (six) hours as needed.      Verbal and written Discharge instructions given to the patient. Wound care per Discharge AVS Follow-up Information    Ileana Roup, MD Follow up on 08/23/2018.   Specialty:  General Surgery Why:   10:00am, arrive by 9:30am for paperwork and check in.  please bring photo ID and insurance card Contact information: [place] [address] [phone]        Arne Cleveland, MD Follow up in 6 week(s).   Specialties:  Interventional Radiology, Radiology Why:  pt will hear from [facility] for follow up with Dr Vernard Gambles; call [phone] if any questions Contact information: [address] [place] [address] [phone]        Serafina Mitchell, MD Follow up in 3 week(s).   Specialties:  Vascular Surgery, Cardiology Why:  office will arrange  Contact information: [place] [address] [phone]        [facility] Follow up.   Specialty:  [place] Why:  HHRN/PT arranged - they will call you to set up home visits Contact information: [place] [address] [place] Follow up.   Why:  rolling walker arranged- to be delivered to room prior to discharge Contact information: [address] [place] [address] [phone]           Signed: [name] 08/02/2018, 5:13 PM - For VQI Registry use --- Instructions: Press F2 to tab through selections.  Delete question if not applicable.   Post-op:  Time to Extubation: [x ] In OR, [ ]  < 12 hrs, [ ]  12-24 hrs, [ ]  >=24 hrs Vasopressors Req. Post-op: No ICU Stay: 3 days Transfusion: Yes  If yes, 3 units given MI: [x ] No, [ ]  Troponin only, [ ]  EKG or Clinical New Arrhythmia: No  Complications: CHF: No Resp failure: [x ] none, [ ]  Pneumonia, [ ]  Ventilator Chg in renal function: [ ]  none, [x ] Inc. Cr > 0.5, [ ]  Temp. Dialysis, [ ]  Permanent dialysis Leg ischemia: [x ] No, [ ]  Yes, no Surgery needed, [ ]  Yes, Surgery needed, [ ]  Amputation Bowel ischemia: [x ] No, [ ]  Medical Rx, [ ]  Surgical Rx [x]  cholecystitis  Wound complication: [x ] No, [ ]  Superficial  separation/infection, [ ]  Return to OR Return to OR: No  Return to OR for  bleeding: No Stroke: [x ] None, [ ]  Minor, [ ]  Major  Discharge medications: Statin use:  Yes ASA use:  No  for medical reason   Plavix use:  Yes Beta blocker use: Yes

## 2018-07-29 NOTE — Progress Notes (Addendum)
[facility] [place]  Subjective  - Wants to go home.  Feeling much better.   Objective 116/65 94 98.1 F (36.7 C) (Oral) (!) 25 96%  Intake/Output Summary (Last 24 hours) at 07/29/2018 1153 Last data filed at 07/29/2018 1152 Gross per 24 hour  Intake 980 ml  Output 975 ml  Net 5 ml    Palpable DP B LE Groin incisions healing well without drainage. Abdominal incision healing well, abdomin soft NTTP, Billi bag intact  Heart RRR Lungs non laboroed breathing    Assessment/Planning: POD # 23 Aortobifemoral Cholecystitis   Oral antibiotics sent to pharmacy Augmentin daily PO F/U arranged with IR/Genral surgery for cholecystitis, billi bag care per RN home health.  Billi bag flushed with 5 cc NS and emptied Q8 hours. HH PT ordered, rolling walker ordered for home. Cardiology following for tachycardia Lopressor 25 mg BID, Plavix 75 mg daily, Crestor 20 mg daily.  Plan to discharge home tomorrow. New prescriptions faxed to pharmacy  [name] 07/29/2018 11:53 AM --  Laboratory Lab Results: Recent Labs    07/27/18 0506 07/28/18 0457  WBC 10.7* 9.9  HGB 8.0* 7.9*  HCT 25.9* 25.9*  PLT 405* 394   BMET Recent Labs    07/27/18 0506 07/28/18 0457  NA 142 140  K 4.1 3.8  CL 109 105  CO2 25 26  GLUCOSE 136* 112*  BUN 31* 27*  CREATININE 1.51* 1.50*  CALCIUM 8.0* 7.9*    COAG Lab Results  Component Value Date   INR 1.25 07/06/2018   INR 1.04 07/01/2018   INR 1.04 09/06/2017   No results found for: PTT  Will have patient walk up steps with PT today.  Anticipate d/c home tomorrow.  Continue Augmentin until billi drain removed.  [name]

## 2018-07-29 NOTE — Care Management Note (Signed)
Case Management Note Marvetta Gibbons RN, BSN Transitions of Care Unit 4E- RN Case Manager [phone]  Patient Details  Name: Christian Bailey MRN: [id number] Date of Birth: [birth year]  Subjective/Objective:   Pt admitted s/p  Aortobifem bypass, post op coarse complicated by afib and ileus with NGT placement and TPN started                Action/Plan: PTA pt lived at home with wife, CM notified by Jonelle Sidle with [facility], pt has pre-op referral to [facility] per Vascular office for any [facility] needs for transition home. CM to follow for transition of care needs  Expected Discharge Date:                  Expected Discharge Plan:  [place]  In-House Referral:  Clinical Social Work  Discharge planning Services  CM Consult  Post Acute Care Choice:  Home Health, Durable Medical Equipment Choice offered to:  Patient  DME Arranged:  Walker rolling DME Agency:  [place] Arranged:  RN, PT [facility] Agency:  [facility]  Status of Service:  Completed, signed off  If discussed at [place] of Stay Meetings, dates discussed:    Discharge Disposition: home/home health   Additional Comments:  07/29/18- 1200- [name] RN, CM- pt has had complicated post op coarse, however has progressed to not needed STSNF and will transition home with [facility]- orders placed for HHRN/PT, DME RW- CM spoke with pt and wife at bedside- list provided to pt Per CMS guidelines from medicare.gov website with star ratings (copy placed in shadow chart), pt reports that he has used [facility] in past and wants to use them again. CM has notified Tiffany with [facility] of pt going [facility] with [facility]- (they have had referral since pre-op)- Call made to [facility] with [facility] for DME need- RW to be delivered to room prior to discharge. No other needs identified. Pt will transition home with wife.   Dawayne Patricia, RN 07/29/2018, 4:20 PM

## 2018-07-29 NOTE — Progress Notes (Signed)
CSW informed by the patient's wife he is going home verses SNF. CSW updated RNCM and RN.  Clinical Social Worker will sign off for now as social work intervention is no longer needed.     Thurmond Butts, [place] Social Worker [phone]

## 2018-07-29 NOTE — Progress Notes (Signed)
Occupational Therapy Treatment Patient Details Name: Christian Bailey MRN: [id number] DOB: 21-Dec-1940 Today's Date: 07/29/2018    History of present illness Pt s/p aortobifemoral bypass and redo of rt fem pop bypass on 07/06/18. Hospital course complicated by ileus, NGT place, and cholecystitis. Percutaneous chole drain placed 1/21. PMH - rt reverse total shoulder, HTN, DM, OA, PAD, lower extremity bypass   OT comments  PATIENT IS MAKING STEADY GAINS IN THERAPY AND WANTS TO D/C HOME [place]. DISCHARGE PLANNER IS AWARE.  PATIENT WAS COOPERATIVE WITH THERAPY TODAY AND IS MOTIVATED TO INCREASE ABILITY TO CARE FOR HIMSELF. ACUTE OT TO FOLLOW UNTIL D/C FROM HOSPITAL.   Follow Up Recommendations       Equipment Recommendations       Recommendations for Other Services      Precautions / Restrictions Precautions Precautions: Fall Precaution Comments:  chole drain Restrictions Weight Bearing Restrictions: No       Mobility Bed Mobility                  Transfers       Sit to Stand: Min guard Stand pivot transfers: Min guard       General transfer comment: CUES FOR HAND PLACEMENT    Balance                                           ADL either performed or assessed with clinical judgement   ADL Overall ADL's : Needs assistance/impaired     Grooming: Wash/dry hands;Wash/dry face;Oral care;Min guard;Standing               Lower Body Dressing: Minimal assistance;Moderate assistance;Sit to/from stand   Toilet Transfer: Min guard;Ambulation;Comfort height toilet           Functional mobility during ADLs: Min guard(PATIENT REQUIRED CUES FOR PROPER HAND PLACEMENT) General ADL Comments: PATIENT DID WELL WITH HIS STANDING AT SINK 4-5 MIN MIN GUARD ASSIST. THEN SAT TO REST PROR TO AMB TO BATHROOM.     Vision       Perception     Praxis      Cognition Arousal/Alertness: Awake/alert Behavior During Therapy: WFL for tasks  assessed/performed Overall Cognitive Status: Within Functional Limits for tasks assessed                                          Exercises     Shoulder Instructions       General Comments      Pertinent Vitals/ Pain       Pain Assessment: 0-10 Pain Score: 2  Pain Location: abdomen Pain Descriptors / Indicators: Aching Pain Intervention(s): Limited activity within patient's tolerance;Monitored during session;Premedicated before session  Home Living                                          Prior Functioning/Environment              Frequency           Progress Toward Goals  OT Goals(current goals can now be found in the care plan section)  Progress towards OT goals: Progressing toward goals  Acute Rehab OT Goals Patient Stated  Goal: go home  Plan (patient plans on going home with hh servics)    Co-evaluation                 AM-PAC OT "6 Clicks" Daily Activity     Outcome Measure   Help from another person eating meals?: None Help from another person taking care of personal grooming?: A Little Help from another person toileting, which includes using toliet, bedpan, or urinal?: A Little Help from another person bathing (including washing, rinsing, drying)?: A Little Help from another person to put on and taking off regular upper body clothing?: A Little Help from another person to put on and taking off regular lower body clothing?: A Lot 6 Click Score: 18    End of Session Equipment Utilized During Treatment: Gait belt;Rolling walker      Activity Tolerance Patient tolerated treatment well   Patient Left in bed;with call bell/phone within reach   Nurse Communication (ok therapy)        Time: 5051-8335 OT Time Calculation (min): 29 min  Charges: OT General Charges $OT Visit: 1 Visit OT Treatments $Self Care/Home Management : 82-51 mins  6 CLICKS   Trinadee Verhagen 07/29/2018, 10:54 AM

## 2018-07-29 NOTE — Progress Notes (Signed)
Educated patient's wife on drain care, including flushing and emptying the patient's Right Upper Quadrant drain. Patient's wife was able to verbally perform correct drain care. Patient's wife would like to flush patient's drain herself tomorrow before the patient is discharged home.

## 2018-07-29 NOTE — Consult Note (Signed)
   [facility] CM Inpatient Consult   07/29/2018  Christian Bailey [birth year] [id number]    [place] Management hospital liaison follow up. Spoke with inpatient RNCM who indicates Christian Bailey will go home with home health services.   Went to bedside to speak with Christian Bailey about [place] Management services. Christian Bailey pleasantly declines [facility] Care Management follow up. States "home health is all I need." Provided [facility] Care Management brochure with contact information to call should he change his mind.    Marthenia Rolling, MSN-Ed, RN,BSN [facility] Liaison [phone]

## 2018-07-30 LAB — GLUCOSE, CAPILLARY
Glucose-Capillary: 124 mg/dL — ABNORMAL HIGH (ref 70–99)
Glucose-Capillary: 113 mg/dL — ABNORMAL HIGH (ref 70–99)

## 2018-07-30 NOTE — Progress Notes (Signed)
Pt discharged per MD order. Discharge education reveiwed with pt and family. Wife educated on maintenance and care of bili drain. Pt and family verbalized understanding, questions answered to their satisfaction. PICC and tele removed. Pt escorted to private vehicle.

## 2018-07-30 NOTE — Plan of Care (Signed)

## 2018-07-30 NOTE — Progress Notes (Signed)
Subjective: Interval History: none.. Comfortable.  Ready for discharge.  No nausea.  Objective: Vital signs in last 24 hours: Temp:  [97.4 F (36.3 C)-98.2 F (36.8 C)] 98.2 F (36.8 C) (02/01 0856) Pulse Rate:  [93-107] 103 (02/01 0856) Resp:  [14-25] 16 (02/01 0856) BP: (114-141)/(52-90) 114/52 (02/01 0856) SpO2:  [96 %-100 %] 100 % (02/01 0856) Weight:  [62.4 kg] 62.4 kg (02/01 0415)  Intake/Output from previous day: 01/31 0701 - 02/01 0700 In: 720 [P.O.:720] Out: 500 [Urine:500] Intake/Output this shift: Total I/O In: 180 [P.O.:180] Out: -   Abdomen soft abdominal and groin incisions healing with 2+ femoral pulses bilaterally  Lab Results: Recent Labs    07/28/18 0457  WBC 9.9  HGB 7.9*  HCT 25.9*  PLT 394   BMET Recent Labs    07/28/18 0457  NA 140  K 3.8  CL 105  CO2 26  GLUCOSE 112*  BUN 27*  CREATININE 1.50*  CALCIUM 7.9*    Studies/Results: Ct Abdomen Pelvis Wo Contrast  Result Date: 07/18/2018 CLINICAL DATA:  Abdominal distension, nausea and vomiting. Status post aortobifemoral bypass graft with leukocytosis. Increased weakness. EXAM: CT ABDOMEN AND PELVIS WITHOUT CONTRAST TECHNIQUE: Multidetector CT imaging of the abdomen and pelvis was performed following the standard protocol without IV contrast. COMPARISON:  07/16/2018. FINDINGS: Lower chest: Again demonstrated are moderate-sized bilateral pleural effusions, without significant change. Associated bilateral compressive lower lobe atelectasis is also unchanged. Minimal pericardial effusion with a maximum thickness of 11 mm. Hepatobiliary: Stable small medial segment left lobe liver cyst. Ill-defined low density in the right lobe of the liver due to streak artifacts produced by the patient's right arm. The gallbladder remains dilated with poorly defined margins and mild pericholecystic edema. Pancreas: Unremarkable. No pancreatic ductal dilatation or surrounding inflammatory changes. Spleen: Normal in  size without focal abnormality. Adrenals/Urinary Tract: Normal appearing adrenal glands. Bilateral renal cysts. Tiny mid right renal calculus. Air in the urinary bladder Stomach/Bowel: No significant change in gaseous distention of the transverse colon. The previously seen gas distended small bowel loops are normal in caliber. Decompressed stomach with a nasogastric tube in place. Mild diffuse gastric wall thickening, accentuated by the lack of distention. Vascular/Lymphatic: The previously demonstrated heterogeneous 3.2 x 1.7 cm fluid collection adjacent to the right iliac portion of the aorta by femoral graft measures 4.6 x 2.9 cm on image number 65 series 3. The previously demonstrated 10.3 x 6.0 cm larger, similar-appearing collection on the left, measures 10.0 x 7.4 cm on image number 58 series 3. This currently measures 12.9 cm in length on coronal image number 84, previously 13.2 cm. A fluid collection adjacent to the proximal aortic portion of the graft measures 1.8 x 1.0 cm on image number 36 series 3, previously 2.0 x 1.2 cm. Atheromatous arterial calcifications. Reproductive: Mildly enlarged prostate gland containing coarse calcifications. Other: Small right and small to moderate-sized left inguinal hernias containing fat. Small amount of free peritoneal fluid. Bilateral subcutaneous edema. A small triangular-shaped fluid collection inferior to the pancreatic body/tail junction measures 2.4 x 1.7 cm on image number 34 and 6 Hounsfield units in density, without significant change. Musculoskeletal: Extensive lumbar and lower thoracic spine degenerative changes with changes of DISH. Mild bilateral hip degenerative changes. IMPRESSION: 1. Mild increase in size of a right pelvic retroperitoneal hematoma measuring 4.6 x 2.9 cm today. 2. No significant change in a large left pelvic retroperitoneal hematoma. 3. No significant change in moderate-sized bilateral pleural effusions and associated bilateral  compressive lower  lobe atelectasis. 4. Stable dilated gallbladder with poorly defined margins and mild pericholecystic edema. This could be due to cholecystitis. 5. Mild diffuse gastric wall thickening, accentuated by the lack of distention. 6. Mild increase in size of a small fluid collections adjacent to the aortic graft, as described above. 7. Stable small amount of free peritoneal fluid. Electronically Signed   By: Claudie Revering M.D.   On: 07/18/2018 15:12   Ct Abdomen Pelvis Wo Contrast  Result Date: 07/16/2018 CLINICAL DATA:  Status post open repair of the abdominal aneurysm with a bifurcated aortobifemoral graft. Left retroperitoneal hematoma. Elevated white count. EXAM: CT ABDOMEN AND PELVIS WITHOUT CONTRAST TECHNIQUE: Multidetector CT imaging of the abdomen and pelvis was performed following the standard protocol without IV contrast. COMPARISON:  07/13/2018 FINDINGS: Lower chest: Small pleural effusions again noted, minimal enlargement. Dependent bibasilar atelectasis. Normal heart size. No pericardial effusion. Hepatobiliary: Stable hypodense hepatic cyst left hepatic dome. No intrahepatic biliary dilatation. Gallbladder is slightly more distended. Increased strandy edema about the gallbladder in the right upper quadrant adjacent to the hepatic flexure of the colon. Difficult to exclude cholecystitis. Common bile duct nondilated. Pancreas: Unremarkable. No pancreatic ductal dilatation or surrounding inflammatory changes. Spleen: Normal in size without focal abnormality. Adrenals/Urinary Tract: Normal adrenal glands. No renal obstruction or hydronephrosis. Ureters are nondilated. Stable right renal cyst. Moderate bladder distention containing air as before. Stomach/Bowel: Scattered air and stool throughout the bowel. Slight increased colonic distention particularly of the transverse colon without obstruction. Appearance suggest mild colonic ileus. Vascular/Lymphatic: Patient is status post aortobifemoral  bypass. Stable perigraft retroperitoneal small fluid collections about the aortic portion of the graft and along the iliac limbs. Small right iliac collection is smaller measuring 3.2 x 1.7 cm, previously 4.1 x 2.5 cm. Large left abdominopelvic retroperitoneal hematoma is grossly stable in size measuring 10 x 6 x 13 cm. Stable small circumferential fluid collections in the inguinal regions about the operative sites. No new abdominopelvic fluid collections. Reproductive: Prostate is unremarkable. Other: Intact abdominal wall. Trace fluid about the inferior liver margin. Perisplenic fluid has resolved. Musculoskeletal: Advanced degenerative changes of the spine and pelvis. No new acute osseous finding or fracture. IMPRESSION: Stable small postoperative fluid collections about the aortobifemoral bypass graft. Stable large left retroperitoneal abdominopelvic hematoma. No new abdominopelvic fluid collections. Slight increased gallbladder distension and increased right upper quadrant edema/inflammation may represent developing mild cholecystitis. Consider correlation with right upper quadrant ultrasound. Mild increased bowel distension most pronounced of the transverse colon suggesting mild ileus without obstruction. These results were called by telephone at the time of interpretation on 07/16/2018 at 11:04 am to Dr. Harold Barban , who verbally acknowledged these results. Electronically Signed   By: Jerilynn Mages.  Shick M.D.   On: 07/16/2018 11:05   Ct Abdomen Pelvis Wo Contrast  Result Date: 07/13/2018 CLINICAL DATA:  Status post aortobifemoral bypass with left lower quadrant pain EXAM: CT ABDOMEN AND PELVIS WITHOUT CONTRAST TECHNIQUE: Multidetector CT imaging of the abdomen and pelvis was performed following the standard protocol without IV contrast. COMPARISON:  02/03/2018 FINDINGS: Lower chest: Bilateral pleural effusions are noted right greater than left with associated right lower lobe atelectasis. Hepatobiliary: Liver is  well visualized and within normal limits. A small cyst is again seen similar to that noted on prior CT examination. The gallbladder demonstrates hyperdense material with changes suggestive of wall thickening and mild pericholecystic inflammatory change. A small amount of perihepatic fluid is noted. Pancreas: Unremarkable. No pancreatic ductal dilatation or surrounding inflammatory changes. Spleen:  Normal in size without focal abnormality. Adrenals/Urinary Tract: Adrenal glands are within normal limits. Stable right renal cyst is seen. Renal vascular calcifications are noted. No calculi or obstructive changes are noted. Infarct in the lower pole of the left kidney is noted stable from the prior exam. Bladder is well distended. Air is noted within the bladder likely related to prior instrumentation. Stomach/Bowel: Diverticular change of the colon is noted without evidence of diverticulitis. No obstructive or inflammatory changes are seen. The appendix is not well visualized and may have been surgically removed. No obstructive changes are noted within the small bowel. Stomach is decompressed. Vascular/Lymphatic: Aortic calcifications are identified. Changes consistent with aortobifemoral bypass graft are noted. Adjacent to the limbs of the graft bilaterally there are mildly hyperdense collections identified. On the left, this collection measures 10.0 x 6.1 by 12.9 cm in greatest transverse, AP and craniocaudad projections. Adjacent to the right limb is a smaller 4.0 by 2.5 cm collection identified. These are both consistent with postoperative hematomas. Similar postoperative collections are noted adjacent to the distal anastomotic sites. The collection surrounding the right distal anastomosis is hypodense and may represent a seroma. Slight increased density is noted in the left Perianastomotic collection likely representing hematoma. No significant lymphadenopathy is noted. Reproductive: Prostate is unremarkable.  Other: Minimal free fluid is noted adjacent to the liver and spleen likely related to the known postoperative state. Musculoskeletal: Degenerative changes of lumbar spine and hip joints are seen. No acute bony abnormality is noted. IMPRESSION: Postoperative fluid collections adjacent to the aortobifemoral bypass graft bilaterally as described above. The largest of these lies along the course of the left limb with increased attenuation identified consistent with hematoma. Mild inflammatory changes surrounding the gallbladder. Ultrasound may be helpful for further evaluation. Diverticulosis without diverticulitis. These results will be called to the ordering clinician or representative by the Radiologist Assistant, and communication documented in the PACS or zVision Dashboard. Electronically Signed   By: Inez Catalina M.D.   On: 07/13/2018 11:19   Dg Abd 1 View  Result Date: 07/22/2018 CLINICAL DATA:  78 year old male with several weeks of persistent abdominal distension. Pelvic retroperitoneal hematoma percutaneous. Status post aortobifemoral bypass. Cholecystostomy tube placement 3 days ago for acute cholecystitis. EXAM: ABDOMEN - 1 VIEW COMPARISON:  07/20/2018 and earlier. FINDINGS: AP supine views at 1037 hours. Right upper quadrant cholecystostomy tube in place. Increased gas-filled but nondilated small bowel since 07/11/2018. No dilated large bowel. Mild to moderate retained stool in the ascending and descending colon. There are bilateral iliac artery stents. No acute osseous abnormality identified. IMPRESSION: 1. Increased gas-filled but nondilated small bowel since 07/11/2018 might reflect ileus. No evidence of mechanical bowel obstruction. 2. Stable cholecystostomy tube. Electronically Signed   By: Genevie Ann M.D.   On: 07/22/2018 10:58   Dg Abd 1 View  Result Date: 07/18/2018 CLINICAL DATA:  Enteric tube advanced into stomach under fluoroscopy. EXAM: ABDOMEN - 1 VIEW; DG NASO G TUBE PLC W/FL-NO RAD  COMPARISON:  07/17/2018 abdominal radiograph FINDINGS: Fluoroscopy time 1 minutes 7 seconds. Enteric tube tip advanced to the body of the stomach. IMPRESSION: Enteric tube is now well positioned with the tip in the body of the stomach after advancement under fluoroscopy. Electronically Signed   By: Ilona Sorrel M.D.   On: 07/18/2018 09:50   Dg Abd 1 View  Result Date: 07/16/2018 CLINICAL DATA:  Abdominal distension, status post aortobifemoral bypass EXAM: ABDOMEN - 1 VIEW COMPARISON:  07/11/2018 abdominal radiograph FINDINGS: Mild diffuse dilatation  of the small and large bowel with moderate colonic stool. No evidence of pneumatosis or pneumoperitoneum. Small right pleural effusion. IMPRESSION: 1. Mild diffuse dilatation of the small and large bowel with moderate colonic stool, most compatible with adynamic ileus. 2. Small right pleural effusion. Electronically Signed   By: Ilona Sorrel M.D.   On: 07/16/2018 09:04   Dg Abd 1 View  Result Date: 07/11/2018 CLINICAL DATA:  Lower abdominal pain for 1 day. EXAM: ABDOMEN - 1 VIEW COMPARISON:  07/06/2018 FINDINGS: Moderate colonic stool burden without evidence of enteric obstruction. Nondiagnostic evaluation for pneumoperitoneum secondary to supine positioning and exclusion of the lower thorax. No pneumatosis or portal venous gas. Vascular stents overlie the pelvis bilaterally. Scattered surgical clips. No definitive abnormal intra-abdominal calcifications. Stigmata of DISH within the lower thoracic spine. Degenerative change the bilateral hips with joint space loss, subchondral sclerosis and osteophytosis. IMPRESSION: Moderate colonic stool burden without evidence of enteric obstruction. Electronically Signed   By: Sandi Mariscal M.D.   On: 07/11/2018 17:29   Nm Hepatobiliary Liver Func  Result Date: 07/19/2018 CLINICAL DATA:  78 y/o  M; right upper quadrant pain and tenderness. EXAM: NUCLEAR MEDICINE HEPATOBILIARY IMAGING TECHNIQUE: Sequential images of the  abdomen were obtained out to 60 minutes following intravenous administration of radiopharmaceutical. RADIOPHARMACEUTICALS:  5.4 mCi Tc-46m  Choletec IV COMPARISON:  None. FINDINGS: Prompt uptake and biliary excretion of activity by the liver is seen. No gallbladder activity is visualized, consistent with obstruction of cystic duct. Biliary activity passes into small bowel, consistent with patent common bile duct. IMPRESSION: No gallbladder activity consistent with obstruction of cystic duct and acute cholecystitis. These results will be called to the ordering clinician or representative by the Radiologist Assistant, and communication documented in the PACS or zVision Dashboard. Electronically Signed   By: Kristine Garbe M.D.   On: 07/19/2018 15:07   Ir Perc Cholecystostomy  Result Date: 07/20/2018 CLINICAL DATA:  Acute cholecystitis EXAM: PERCUTANEOUS CHOLECYSTOSTOMY TUBE PLACEMENT WITH ULTRASOUND AND FLUOROSCOPIC GUIDANCE FLUOROSCOPY TIME:  0.9 minutes; 29 uGym2 DAP TECHNIQUE: The procedure, risks (including but not limited to bleeding, infection, organ damage ), benefits, and alternatives were explained to the patient. Questions regarding the procedure were encouraged and answered. The patient understands and consents to the procedure. Survey ultrasound of the abdomen was performed and an appropriate skin entry site was identified. Skin site was marked, prepped with chlorhexidine, and draped in usual sterile fashion, and infiltrated locally with 1% lidocaine. Intravenous Fentanyl and Versed were administered as conscious sedation during continuous monitoring of the patient's level of consciousness and physiological / cardiorespiratory status by the radiology RN, with a total moderate sedation time of 14 minutes. Under real-time ultrasound guidance, gallbladder was accessed using a transhepatic approach with a 21-gauge needle. Ultrasound image documentation was saved. Bile returned through the hub.  Needle was exchanged over a 018 guidewire for transitional dilator which allowed placement of 035 J wire. Over this, a 10.2 French pigtail catheter was advanced and formed centrally in the gallbladder lumen. Small contrast injection confirmed appropriate position. Catheter secured externally with 0 Prolene suture and placed external drain bag. Patient tolerated the procedure well. COMPLICATIONS: COMPLICATIONS none IMPRESSION: 1. Technically successful percutaneous cholecystostomy tube placement with ultrasound and fluoroscopic guidance. Electronically Signed   By: Lucrezia Europe M.D.   On: 07/20/2018 08:02   Dg Chest Port 1 View  Result Date: 07/07/2018 CLINICAL DATA:  Aortobifemoral bypass. EXAM: PORTABLE CHEST 1 VIEW COMPARISON:  07/06/2018. FINDINGS: NG tube noted with  tip below left hemidiaphragm. Heart size normal. Right base atelectasis/infiltrate. Small right pleural effusion. No pneumothorax. Total replacement right shoulder. IMPRESSION: 1.  NG tube noted with tip below left hemidiaphragm. 2.  Right base atelectasis and infiltrate noted on today's exam. Electronically Signed   By: Marcello Moores  Register   On: 07/07/2018 06:23   Dg Chest Port 1 View  Result Date: 07/06/2018 CLINICAL DATA:  Postop EXAM: PORTABLE CHEST 1 VIEW COMPARISON:  CT 02/03/2018 FINDINGS: Esophageal tube tip is below the diaphragm but non included. Status post right shoulder replacement. Subsegmental atelectasis at the left base. No focal consolidation or effusion. Normal heart size. No pneumothorax. IMPRESSION: Low lung volumes with subsegmental atelectasis at the left base. Electronically Signed   By: Donavan Foil M.D.   On: 07/06/2018 17:17   Dg Abd Portable 1v  Result Date: 07/20/2018 CLINICAL DATA:  Check gastric catheter placement EXAM: PORTABLE ABDOMEN - 1 VIEW COMPARISON:  Film from earlier in the same day. FINDINGS: Scattered large and small bowel gas is noted. Gastric catheter is stable in appearance with the proximal side  port within the distal esophagus. The tip is just beyond the gastroesophageal junction. No significant changes noted. If there has been interval advancement of the catheter films over the chest and neck should be obtained to assess for coiling within the pharynx. IMPRESSION: No change in nasogastric catheter position as described above. Electronically Signed   By: Inez Catalina M.D.   On: 07/20/2018 11:58   Dg Abd Portable 1v  Result Date: 07/20/2018 CLINICAL DATA:  Check gastric catheter placement EXAM: PORTABLE ABDOMEN - 1 VIEW COMPARISON:  07/18/2018 FINDINGS: Gastric catheter is noted with the tip in the stomach. Proximal side port lies in the distal esophagus. This could be advanced several cm. Cholecystostomy tube is noted in the right upper quadrant. Scattered large and small bowel gas is noted. No obstructive changes are seen. Degenerative changes of the thoracolumbar spine are noted. IMPRESSION: Gastric catheter as described.  This could be advanced several cm. Electronically Signed   By: Inez Catalina M.D.   On: 07/20/2018 07:44   Dg Abd Portable 1v  Result Date: 07/17/2018 CLINICAL DATA:  NG tube placement EXAM: PORTABLE ABDOMEN - 1 VIEW COMPARISON:  None. FINDINGS: NG tube with tip in the gastric cardia. Side port above the GE junction. Consider advancing 6 to 8 cm such that the side port is below the GE junction. Gas-filled loops of bowel. IMPRESSION: NG tube tip in stomach.  Consider advancement as above. Electronically Signed   By: Suzy Bouchard M.D.   On: 07/17/2018 22:35   Dg Abd Portable 1v  Result Date: 07/06/2018 CLINICAL DATA:  Postop EXAM: PORTABLE ABDOMEN - 1 VIEW COMPARISON:  None. FINDINGS: Esophageal tube tip projects over gastric outlet region. Visible gas pattern is nonobstructed. Bilateral iliac stents. No radiopaque calculi. IMPRESSION: 1. Esophageal tube tip overlies the gastric outlet 2. Nonobstructed bowel-gas pattern. Electronically Signed   By: Donavan Foil M.D.   On:  07/06/2018 17:20   Dg [place] Bailey G Tube Plc W/fl-no Rad  Result Date: 07/18/2018 CLINICAL DATA:  Enteric tube advanced into stomach under fluoroscopy. EXAM: ABDOMEN - 1 VIEW; DG NASO G TUBE PLC W/FL-NO RAD COMPARISON:  07/17/2018 abdominal radiograph FINDINGS: Fluoroscopy time 1 minutes 7 seconds. Enteric tube tip advanced to the body of the stomach. IMPRESSION: Enteric tube is now well positioned with the tip in the body of the stomach after advancement under fluoroscopy. Electronically Signed   By: Corene Cornea  A Poff M.D.   On: 07/18/2018 09:50   [place] Ekg Site Rite  Result Date: 07/16/2018 If Site Rite image not attached, placement could not be confirmed due to current cardiac rhythm.  US Abdomen Limited Ruq  Result Date: 07/19/2018 CLINICAL DATA:  Abdominal pain. EXAM: ULTRASOUND ABDOMEN LIMITED RIGHT UPPER QUADRANT COMPARISON:  CT 07/18/2018. FINDINGS: Gallbladder: Sludge in the gallbladder. Gallbladder wall thickness 4.2 mm. Pericholecystic fluid noted. Negative Murphy sign. Common bile duct: Diameter: 5.8 mm Liver: 1.2 cm thinly septated cyst left hepatic lobe, most likely benign. Similar findings on prior exam. Within normal limits in parenchymal echogenicity. Portal vein is patent on color Doppler imaging with normal direction of blood flow towards the liver. Incidental note is made of right pleural effusion. IMPRESSION: 1. Large amount of sludge noted. Gallbladder wall is thickened at 4.2 mm. Pericholecystic fluid noted. Cholecystitis can not be excluded. 2. Incidental note is made of right pleural effusion. Electronically Signed   By: [place]   On: 07/19/2018 10:52   US Abdomen Limited Ruq  Result Date: 07/16/2018 CLINICAL DATA:  Followup current CT. Gallbladder appears distended and possibly inflamed. EXAM: ULTRASOUND ABDOMEN LIMITED RIGHT UPPER QUADRANT COMPARISON:  CT, 07/16/2018. FINDINGS: Gallbladder: Gallbladder is distended. Fundus not well seen due to overlying bowel gas. There are  dependent echoes suggesting sludge, but no shadowing stones. Wall measures 3 mm in thickness. No pericholecystic fluid. No sonographic Murphy's sign. Common bile duct: Diameter: 2 mm Liver: Somewhat coarsened echotexture. Portions of the left lobe are obscured by midline bowel gas. Liver normal in overall size. No mass or focal lesion. Note is made of a 4 cm right renal cyst and right pleural effusion. IMPRESSION: 1. Limited exam due to the patient's body habitus and limited acoustic window in the right upper quadrant as well as midline bowel gas. 2. Allowing for this, the gallbladder is distended. Wall is top-normal in thickness. There is no pericholecystic fluid and no shadowing stones. There is possible dependent sludge. No sonographic Murphy's sign. Overall findings argue against acute cholecystitis. 3. Small right pleural effusion.  Right renal cyst. Electronically Signed   By: Lajean Manes M.D.   On: 07/16/2018 13:40   Anti-infectives: Anti-infectives (From admission, onward)   Start     Dose/Rate Route Frequency Ordered Stop   07/29/18 0000  amoxicillin-clavulanate (AUGMENTIN) 875-125 MG tablet     1 tablet Oral Every 12 hours 07/29/18 1144     07/28/18 1415  amoxicillin-clavulanate (AUGMENTIN) 875-125 MG per tablet 1 tablet     1 tablet Oral Every 12 hours 07/28/18 1408     07/20/18 2300  piperacillin-tazobactam (ZOSYN) IVPB 3.375 g     3.375 g 12.5 mL/hr over 240 Minutes Intravenous Every 8 hours 07/20/18 1525 07/28/18 0410   07/19/18 1715  ceFAZolin (ANCEF) IVPB 2g/100 mL premix     2 g 200 mL/hr over 30 Minutes Intravenous  Once 07/19/18 1711 07/19/18 1755   07/19/18 1707  ceFAZolin (ANCEF) 2-4 GM/100ML-% IVPB    Note to Pharmacy:  Desiree Hane   : cabinet override      07/19/18 1707 07/20/18 0514   07/17/18 2000  metroNIDAZOLE (FLAGYL) IVPB 500 mg  Status:  Discontinued     500 mg 100 mL/hr over 60 Minutes Intravenous Every 6 hours 07/17/18 1956 07/17/18 1959   07/17/18 2000   piperacillin-tazobactam (ZOSYN) IVPB 3.375 g  Status:  Discontinued     3.375 g 12.5 mL/hr over 240 Minutes Intravenous Every 8 hours 07/17/18  1959 07/20/18 1525   07/06/18 2200  ceFAZolin (ANCEF) IVPB 2g/100 mL premix     2 g 200 mL/hr over 30 Minutes Intravenous Every 8 hours 07/06/18 1840 07/07/18 0548   07/06/18 0730  ceFAZolin (ANCEF) IVPB 2g/100 mL premix     2 g 200 mL/hr over 30 Minutes Intravenous To [facility] 07/05/18 1140 07/06/18 1443   07/06/18 0700  vancomycin (VANCOCIN) IVPB 1000 mg/200 mL premix     1,000 mg 200 mL/hr over 60 Minutes Intravenous To [facility] 07/05/18 1154 07/06/18 1006      Assessment/Plan: s/p Procedure(s): AORTA BIFEMORAL BYPASS GRAFT USING 14X7MM X 40CM HEMASHIELD GOLD GRAFT (N/A) REVISION RIGHT FEMORAL TO POPLITEAL ARTERY BYPASS GRAFT (Right) Discharge home today   LOS: 24 days   Marda Breidenbach 07/30/2018, 9:34 AM

## 2018-07-31 DIAGNOSIS — Z7902 Long term (current) use of antithrombotics/antiplatelets: Secondary | ICD-10-CM | POA: Diagnosis not present

## 2018-07-31 DIAGNOSIS — K819 Cholecystitis, unspecified: Secondary | ICD-10-CM | POA: Diagnosis not present

## 2018-07-31 DIAGNOSIS — Z7984 Long term (current) use of oral hypoglycemic drugs: Secondary | ICD-10-CM | POA: Diagnosis not present

## 2018-07-31 DIAGNOSIS — Z48812 Encounter for surgical aftercare following surgery on the circulatory system: Secondary | ICD-10-CM | POA: Diagnosis not present

## 2018-07-31 DIAGNOSIS — E1151 Type 2 diabetes mellitus with diabetic peripheral angiopathy without gangrene: Secondary | ICD-10-CM | POA: Diagnosis not present

## 2018-07-31 DIAGNOSIS — I10 Essential (primary) hypertension: Secondary | ICD-10-CM | POA: Diagnosis not present

## 2018-07-31 DIAGNOSIS — D649 Anemia, unspecified: Secondary | ICD-10-CM | POA: Diagnosis not present

## 2018-07-31 DIAGNOSIS — Z87891 Personal history of nicotine dependence: Secondary | ICD-10-CM | POA: Diagnosis not present

## 2018-07-31 DIAGNOSIS — I739 Peripheral vascular disease, unspecified: Secondary | ICD-10-CM | POA: Diagnosis not present

## 2018-07-31 DIAGNOSIS — I2510 Atherosclerotic heart disease of native coronary artery without angina pectoris: Secondary | ICD-10-CM | POA: Diagnosis not present

## 2018-08-01 ENCOUNTER — Telehealth: Payer: Self-pay | Admitting: Surgery

## 2018-08-01 ENCOUNTER — Other Ambulatory Visit: Payer: Self-pay | Admitting: Surgery

## 2018-08-01 DIAGNOSIS — Z7984 Long term (current) use of oral hypoglycemic drugs: Secondary | ICD-10-CM | POA: Diagnosis not present

## 2018-08-01 DIAGNOSIS — Z48812 Encounter for surgical aftercare following surgery on the circulatory system: Secondary | ICD-10-CM | POA: Diagnosis not present

## 2018-08-01 DIAGNOSIS — I739 Peripheral vascular disease, unspecified: Secondary | ICD-10-CM | POA: Diagnosis not present

## 2018-08-01 DIAGNOSIS — E1151 Type 2 diabetes mellitus with diabetic peripheral angiopathy without gangrene: Secondary | ICD-10-CM | POA: Diagnosis not present

## 2018-08-01 DIAGNOSIS — D649 Anemia, unspecified: Secondary | ICD-10-CM | POA: Diagnosis not present

## 2018-08-01 DIAGNOSIS — K819 Cholecystitis, unspecified: Secondary | ICD-10-CM | POA: Diagnosis not present

## 2018-08-01 DIAGNOSIS — K810 Acute cholecystitis: Secondary | ICD-10-CM

## 2018-08-01 NOTE — Telephone Encounter (Signed)
Christian Bailey, PT from [facility] called.  Pts blood pressure was 100/40 sitting down, 110/40 standing up, and 118/50 after walking. Pt just has nausea. Per [name], he is aware.  Keep watching pt and have him go to ED if worse.  (Other vitals were normal). We can order Zofran for nausea and vomiting.  Christian Bailey is aware and verbalized understanding.  Thurston Hole., LPN

## 2018-08-02 ENCOUNTER — Other Ambulatory Visit: Payer: Self-pay | Admitting: *Deleted

## 2018-08-02 ENCOUNTER — Telehealth: Payer: Self-pay | Admitting: Surgery

## 2018-08-02 ENCOUNTER — Other Ambulatory Visit: Payer: Self-pay | Admitting: Surgery

## 2018-08-02 DIAGNOSIS — Z48812 Encounter for surgical aftercare following surgery on the circulatory system: Secondary | ICD-10-CM | POA: Diagnosis not present

## 2018-08-02 DIAGNOSIS — I739 Peripheral vascular disease, unspecified: Secondary | ICD-10-CM | POA: Diagnosis not present

## 2018-08-02 DIAGNOSIS — K819 Cholecystitis, unspecified: Secondary | ICD-10-CM | POA: Diagnosis not present

## 2018-08-02 DIAGNOSIS — E1151 Type 2 diabetes mellitus with diabetic peripheral angiopathy without gangrene: Secondary | ICD-10-CM | POA: Diagnosis not present

## 2018-08-02 DIAGNOSIS — Z7984 Long term (current) use of oral hypoglycemic drugs: Secondary | ICD-10-CM | POA: Diagnosis not present

## 2018-08-02 DIAGNOSIS — D649 Anemia, unspecified: Secondary | ICD-10-CM | POA: Diagnosis not present

## 2018-08-02 MED ORDER — ONDANSETRON HCL 4 MG PO TABS: 4.0000 mg | ORAL_TABLET | Freq: Every day | ORAL | 1 refills | Status: DC | PRN

## 2018-08-02 MED ORDER — ONDANSETRON 4 MG PO TBDP: 4.0000 mg | ORAL_TABLET | Freq: Three times a day (TID) | ORAL | 0 refills | Status: DC | PRN

## 2018-08-02 NOTE — Progress Notes (Signed)
Call to wife, notified that e-script for Zofran sent in and to report to ER if problems persist or worsen per [name].

## 2018-08-02 NOTE — Telephone Encounter (Signed)
sch appt spk to pt son mld ltr 08/15/2018 830am p/o MD

## 2018-08-02 NOTE — Telephone Encounter (Signed)
-----   Message from Mena Goes, RN sent at 07/30/2018  1:07 PM EST ----- Regarding: 2 weeks with [name] postop  ----- Message ----- From: Serafina Mitchell, MD Sent: 07/29/2018  11:15 PM EST To: [facility]vs Charge Pool  F/u 2 weeks with me

## 2018-08-03 ENCOUNTER — Emergency Department (HOSPITAL_COMMUNITY): Payer: Medicare Other

## 2018-08-03 ENCOUNTER — Inpatient Hospital Stay (HOSPITAL_COMMUNITY)
Admission: EM | Admit: 2018-08-03 | Discharge: 2018-08-08 | DRG: 641 | Disposition: A | Discharge status: Skilled Nursing Facility | Payer: Medicare Other | Attending: Surgery | Admitting: Surgery

## 2018-08-03 ENCOUNTER — Other Ambulatory Visit: Payer: Self-pay

## 2018-08-03 ENCOUNTER — Encounter (HOSPITAL_COMMUNITY): Payer: Self-pay

## 2018-08-03 DIAGNOSIS — E785 Hyperlipidemia, unspecified: Secondary | ICD-10-CM | POA: Diagnosis present

## 2018-08-03 DIAGNOSIS — K449 Diaphragmatic hernia without obstruction or gangrene: Secondary | ICD-10-CM | POA: Diagnosis present

## 2018-08-03 DIAGNOSIS — I739 Peripheral vascular disease, unspecified: Secondary | ICD-10-CM | POA: Diagnosis not present

## 2018-08-03 DIAGNOSIS — Z833 Family history of diabetes mellitus: Secondary | ICD-10-CM | POA: Diagnosis not present

## 2018-08-03 DIAGNOSIS — Z87891 Personal history of nicotine dependence: Secondary | ICD-10-CM

## 2018-08-03 DIAGNOSIS — T883XXA Malignant hyperthermia due to anesthesia, initial encounter: Secondary | ICD-10-CM | POA: Diagnosis not present

## 2018-08-03 DIAGNOSIS — K219 Gastro-esophageal reflux disease without esophagitis: Secondary | ICD-10-CM | POA: Diagnosis present

## 2018-08-03 DIAGNOSIS — Z79899 Other long term (current) drug therapy: Secondary | ICD-10-CM | POA: Diagnosis not present

## 2018-08-03 DIAGNOSIS — K5732 Diverticulitis of large intestine without perforation or abscess without bleeding: Secondary | ICD-10-CM | POA: Diagnosis not present

## 2018-08-03 DIAGNOSIS — R627 Adult failure to thrive: Principal | ICD-10-CM | POA: Diagnosis present

## 2018-08-03 DIAGNOSIS — K8010 Calculus of gallbladder with chronic cholecystitis without obstruction: Secondary | ICD-10-CM | POA: Diagnosis not present

## 2018-08-03 DIAGNOSIS — Z96611 Presence of right artificial shoulder joint: Secondary | ICD-10-CM | POA: Diagnosis present

## 2018-08-03 DIAGNOSIS — Z8719 Personal history of other diseases of the digestive system: Secondary | ICD-10-CM | POA: Diagnosis not present

## 2018-08-03 DIAGNOSIS — Z885 Allergy status to narcotic agent status: Secondary | ICD-10-CM

## 2018-08-03 DIAGNOSIS — Z91041 Radiographic dye allergy status: Secondary | ICD-10-CM

## 2018-08-03 DIAGNOSIS — I452 Bifascicular block: Secondary | ICD-10-CM | POA: Diagnosis present

## 2018-08-03 DIAGNOSIS — Z9841 Cataract extraction status, right eye: Secondary | ICD-10-CM | POA: Diagnosis not present

## 2018-08-03 DIAGNOSIS — Z811 Family history of alcohol abuse and dependence: Secondary | ICD-10-CM

## 2018-08-03 DIAGNOSIS — Z9842 Cataract extraction status, left eye: Secondary | ICD-10-CM

## 2018-08-03 DIAGNOSIS — Z95828 Presence of other vascular implants and grafts: Secondary | ICD-10-CM

## 2018-08-03 DIAGNOSIS — E1151 Type 2 diabetes mellitus with diabetic peripheral angiopathy without gangrene: Secondary | ICD-10-CM | POA: Diagnosis present

## 2018-08-03 DIAGNOSIS — R279 Unspecified lack of coordination: Secondary | ICD-10-CM | POA: Diagnosis not present

## 2018-08-03 DIAGNOSIS — R531 Weakness: Secondary | ICD-10-CM | POA: Diagnosis not present

## 2018-08-03 DIAGNOSIS — K819 Cholecystitis, unspecified: Secondary | ICD-10-CM | POA: Diagnosis present

## 2018-08-03 DIAGNOSIS — M1991 Primary osteoarthritis, unspecified site: Secondary | ICD-10-CM | POA: Diagnosis not present

## 2018-08-03 DIAGNOSIS — K5730 Diverticulosis of large intestine without perforation or abscess without bleeding: Secondary | ICD-10-CM | POA: Diagnosis present

## 2018-08-03 DIAGNOSIS — R2689 Other abnormalities of gait and mobility: Secondary | ICD-10-CM | POA: Diagnosis not present

## 2018-08-03 DIAGNOSIS — Z9049 Acquired absence of other specified parts of digestive tract: Secondary | ICD-10-CM

## 2018-08-03 DIAGNOSIS — Z8249 Family history of ischemic heart disease and other diseases of the circulatory system: Secondary | ICD-10-CM

## 2018-08-03 DIAGNOSIS — D649 Anemia, unspecified: Secondary | ICD-10-CM | POA: Diagnosis not present

## 2018-08-03 DIAGNOSIS — I10 Essential (primary) hypertension: Secondary | ICD-10-CM | POA: Diagnosis present

## 2018-08-03 DIAGNOSIS — Z7902 Long term (current) use of antithrombotics/antiplatelets: Secondary | ICD-10-CM

## 2018-08-03 DIAGNOSIS — Z6822 Body mass index (BMI) 22.0-22.9, adult: Secondary | ICD-10-CM

## 2018-08-03 DIAGNOSIS — E119 Type 2 diabetes mellitus without complications: Secondary | ICD-10-CM | POA: Diagnosis not present

## 2018-08-03 DIAGNOSIS — I959 Hypotension, unspecified: Secondary | ICD-10-CM | POA: Diagnosis not present

## 2018-08-03 DIAGNOSIS — I700 Atherosclerosis of aorta: Secondary | ICD-10-CM | POA: Diagnosis present

## 2018-08-03 DIAGNOSIS — Z886 Allergy status to analgesic agent status: Secondary | ICD-10-CM

## 2018-08-03 DIAGNOSIS — Z743 Need for continuous supervision: Secondary | ICD-10-CM | POA: Diagnosis not present

## 2018-08-03 DIAGNOSIS — Z7984 Long term (current) use of oral hypoglycemic drugs: Secondary | ICD-10-CM

## 2018-08-03 DIAGNOSIS — M6281 Muscle weakness (generalized): Secondary | ICD-10-CM | POA: Diagnosis not present

## 2018-08-03 DIAGNOSIS — R509 Fever, unspecified: Secondary | ICD-10-CM | POA: Diagnosis not present

## 2018-08-03 DIAGNOSIS — T883XXS Malignant hyperthermia due to anesthesia, sequela: Secondary | ICD-10-CM

## 2018-08-03 DIAGNOSIS — N281 Cyst of kidney, acquired: Secondary | ICD-10-CM | POA: Diagnosis not present

## 2018-08-03 DIAGNOSIS — R112 Nausea with vomiting, unspecified: Secondary | ICD-10-CM | POA: Diagnosis not present

## 2018-08-03 LAB — COMPREHENSIVE METABOLIC PANEL
ALT: 30 U/L (ref 0–44)
AST: 33 U/L (ref 15–41)
Albumin: 2.4 g/dL — ABNORMAL LOW (ref 3.5–5.0)
Alkaline Phosphatase: 104 U/L (ref 38–126)
Anion gap: 11 (ref 5–15)
BUN: 22 mg/dL (ref 8–23)
CO2: 25 mmol/L (ref 22–32)
Calcium: 8.7 mg/dL — ABNORMAL LOW (ref 8.9–10.3)
Chloride: 103 mmol/L (ref 98–111)
Creatinine, Ser: 1.91 mg/dL — ABNORMAL HIGH (ref 0.61–1.24)
GFR calc Af Amer: 38 mL/min — ABNORMAL LOW (ref >60)
GFR calc non Af Amer: 33 mL/min — ABNORMAL LOW (ref >60)
GLUCOSE: 173 mg/dL — AB (ref 70–99)
POTASSIUM: 4.4 mmol/L (ref 3.5–5.1)
Sodium: 139 mmol/L (ref 135–145)
TOTAL PROTEIN: 7 g/dL (ref 6.5–8.1)
Total Bilirubin: 0.5 mg/dL (ref 0.3–1.2)

## 2018-08-03 LAB — URINALYSIS, ROUTINE W REFLEX MICROSCOPIC
Bilirubin Urine: NEGATIVE
Glucose, UA: NEGATIVE mg/dL
Hgb urine dipstick: NEGATIVE
Ketones, ur: NEGATIVE mg/dL
LEUKOCYTES UA: NEGATIVE
Nitrite: NEGATIVE
Protein, ur: NEGATIVE mg/dL
Specific Gravity, Urine: 1.011 (ref 1.005–1.030)
pH: 5 (ref 5.0–8.0)

## 2018-08-03 LAB — CBC WITH DIFFERENTIAL/PLATELET
Abs Immature Granulocytes: 0.13 10*3/uL — ABNORMAL HIGH (ref 0.00–0.07)
Basophils Absolute: 0.1 10*3/uL (ref 0.0–0.1)
Basophils Relative: 1 %
Eosinophils Absolute: 0.1 10*3/uL (ref 0.0–0.5)
Eosinophils Relative: 1 %
HCT: 29.3 % — ABNORMAL LOW (ref 39.0–52.0)
Hemoglobin: 9 g/dL — ABNORMAL LOW (ref 13.0–17.0)
Immature Granulocytes: 1 %
Lymphocytes Relative: 7 %
Lymphs Abs: 0.8 10*3/uL (ref 0.7–4.0)
MCH: 28.6 pg (ref 26.0–34.0)
MCHC: 30.7 g/dL (ref 30.0–36.0)
MCV: 93 fL (ref 80.0–100.0)
Monocytes Absolute: 0.7 10*3/uL (ref 0.1–1.0)
Monocytes Relative: 6 %
NEUTROS PCT: 84 %
Neutro Abs: 9.5 10*3/uL — ABNORMAL HIGH (ref 1.7–7.7)
Platelets: 505 10*3/uL — ABNORMAL HIGH (ref 150–400)
RBC: 3.15 MIL/uL — ABNORMAL LOW (ref 4.22–5.81)
RDW: 15.3 % (ref 11.5–15.5)
WBC: 11.3 10*3/uL — ABNORMAL HIGH (ref 4.0–10.5)
nRBC: 0 % (ref 0.0–0.2)

## 2018-08-03 LAB — PROTIME-INR
INR: 1.05
PROTHROMBIN TIME: 13.6 s (ref 11.4–15.2)

## 2018-08-03 LAB — GLUCOSE, CAPILLARY: Glucose-Capillary: 121 mg/dL — ABNORMAL HIGH (ref 70–99)

## 2018-08-03 LAB — LIPASE, BLOOD: Lipase: 37 U/L (ref 11–51)

## 2018-08-03 MED ORDER — HEPARIN SODIUM (PORCINE) 5000 UNIT/ML IJ SOLN
5000.0000 [IU] | Freq: Three times a day (TID) | INTRAMUSCULAR | Status: DC
  Administered 2018-08-03 – 2018-08-08 (×15): 5000 [IU] via SUBCUTANEOUS
  Filled 2018-08-03 (×15): qty 1

## 2018-08-03 MED ORDER — CLOPIDOGREL BISULFATE 75 MG PO TABS
75.0000 mg | ORAL_TABLET | Freq: Every day | ORAL | Status: DC
  Administered 2018-08-04 – 2018-08-08 (×5): 75 mg via ORAL
  Filled 2018-08-03 (×5): qty 1

## 2018-08-03 MED ORDER — SODIUM CHLORIDE 0.9 % IV BOLUS
500.0000 mL | Freq: Once | INTRAVENOUS | Status: AC
  Administered 2018-08-03: 500 mL via INTRAVENOUS

## 2018-08-03 MED ORDER — ALUM & MAG HYDROXIDE-SIMETH 200-200-20 MG/5ML PO SUSP: 15.0000 mL | ORAL | Status: DC | PRN

## 2018-08-03 MED ORDER — COQ10 30 MG PO CAPS: ORAL_CAPSULE | Freq: Every evening | ORAL | Status: DC

## 2018-08-03 MED ORDER — SODIUM CHLORIDE 0.9 % IV SOLN
INTRAVENOUS | Status: DC
  Administered 2018-08-03 – 2018-08-08 (×9): via INTRAVENOUS

## 2018-08-03 MED ORDER — IOHEXOL 300 MG/ML  SOLN
80.0000 mL | Freq: Once | INTRAMUSCULAR | Status: AC | PRN
  Administered 2018-08-03: 80 mL via INTRAVENOUS

## 2018-08-03 MED ORDER — ONDANSETRON HCL 4 MG/2ML IJ SOLN: 4.0000 mg | Freq: Four times a day (QID) | INTRAMUSCULAR | Status: DC | PRN

## 2018-08-03 MED ORDER — POTASSIUM CHLORIDE CRYS ER 20 MEQ PO TBCR: 20.0000 meq | EXTENDED_RELEASE_TABLET | Freq: Once | ORAL | Status: DC

## 2018-08-03 MED ORDER — ONDANSETRON HCL 4 MG/2ML IJ SOLN
4.0000 mg | Freq: Once | INTRAMUSCULAR | Status: AC
  Administered 2018-08-03: 4 mg via INTRAVENOUS
  Filled 2018-08-03: qty 2

## 2018-08-03 MED ORDER — SODIUM CHLORIDE 0.9 % IV BOLUS
1000.0000 mL | Freq: Once | INTRAVENOUS | Status: AC
  Administered 2018-08-03: 1000 mL via INTRAVENOUS

## 2018-08-03 MED ORDER — PANTOPRAZOLE SODIUM 40 MG PO TBEC
40.0000 mg | DELAYED_RELEASE_TABLET | Freq: Every day | ORAL | Status: DC
  Administered 2018-08-03 – 2018-08-08 (×6): 40 mg via ORAL
  Filled 2018-08-03 (×6): qty 1

## 2018-08-03 MED ORDER — GLUCOSAMINE-CHONDROITIN 500-400 MG PO TABS: 1.0000 | ORAL_TABLET | Freq: Every evening | ORAL | Status: DC

## 2018-08-03 MED ORDER — PHENOL 1.4 % MT LIQD
1.0000 | OROMUCOSAL | Status: DC | PRN
  Filled 2018-08-03: qty 177

## 2018-08-03 MED ORDER — OMEGA-3-ACID ETHYL ESTERS 1 G PO CAPS
1.0000 g | ORAL_CAPSULE | Freq: Two times a day (BID) | ORAL | Status: DC
  Administered 2018-08-03 – 2018-08-08 (×9): 1 g via ORAL
  Filled 2018-08-03 (×10): qty 1

## 2018-08-03 MED ORDER — FAMOTIDINE IN NACL 20-0.9 MG/50ML-% IV SOLN
20.0000 mg | Freq: Once | INTRAVENOUS | Status: AC
  Administered 2018-08-03: 20 mg via INTRAVENOUS
  Filled 2018-08-03: qty 50

## 2018-08-03 MED ORDER — ROSUVASTATIN CALCIUM 20 MG PO TABS
20.0000 mg | ORAL_TABLET | Freq: Every evening | ORAL | Status: DC
  Administered 2018-08-03 – 2018-08-07 (×5): 20 mg via ORAL
  Filled 2018-08-03 (×5): qty 1

## 2018-08-03 MED ORDER — PANTOPRAZOLE SODIUM 40 MG PO TBEC: 40.0000 mg | DELAYED_RELEASE_TABLET | Freq: Two times a day (BID) | ORAL | Status: DC

## 2018-08-03 MED ORDER — METOPROLOL TARTRATE 5 MG/5ML IV SOLN: 2.0000 mg | INTRAVENOUS | Status: DC | PRN

## 2018-08-03 MED ORDER — SCOPOLAMINE 1 MG/3DAYS TD PT72
1.0000 | MEDICATED_PATCH | TRANSDERMAL | Status: DC
  Administered 2018-08-03: 1.5 mg via TRANSDERMAL
  Filled 2018-08-03 (×2): qty 1

## 2018-08-03 MED ORDER — LABETALOL HCL 5 MG/ML IV SOLN: 10.0000 mg | INTRAVENOUS | Status: DC | PRN

## 2018-08-03 MED ORDER — HYDRALAZINE HCL 20 MG/ML IJ SOLN: 5.0000 mg | INTRAMUSCULAR | Status: DC | PRN

## 2018-08-03 MED ORDER — INSULIN ASPART 100 UNIT/ML ~~LOC~~ SOLN
0.0000 [IU] | Freq: Three times a day (TID) | SUBCUTANEOUS | Status: DC
  Administered 2018-08-05 – 2018-08-08 (×3): 1 [IU] via SUBCUTANEOUS

## 2018-08-03 MED ORDER — TRAMADOL HCL 50 MG PO TABS: 50.0000 mg | ORAL_TABLET | Freq: Four times a day (QID) | ORAL | Status: DC | PRN

## 2018-08-03 MED ORDER — METOPROLOL TARTRATE 25 MG PO TABS
25.0000 mg | ORAL_TABLET | Freq: Two times a day (BID) | ORAL | Status: DC
  Administered 2018-08-03 – 2018-08-08 (×10): 25 mg via ORAL
  Filled 2018-08-03 (×10): qty 1

## 2018-08-03 MED ORDER — AMOXICILLIN-POT CLAVULANATE 875-125 MG PO TABS
1.0000 | ORAL_TABLET | Freq: Two times a day (BID) | ORAL | Status: DC
  Administered 2018-08-03 – 2018-08-08 (×10): 1 via ORAL
  Filled 2018-08-03 (×10): qty 1

## 2018-08-03 MED ORDER — LISINOPRIL 5 MG PO TABS
5.0000 mg | ORAL_TABLET | Freq: Two times a day (BID) | ORAL | Status: DC
  Administered 2018-08-03 – 2018-08-08 (×10): 5 mg via ORAL
  Filled 2018-08-03 (×10): qty 1

## 2018-08-03 MED ORDER — GUAIFENESIN-DM 100-10 MG/5ML PO SYRP: 15.0000 mL | ORAL_SOLUTION | ORAL | Status: DC | PRN

## 2018-08-03 NOTE — Consult Note (Addendum)
Hospital Consult    Reason for Consult:  S/p AAA with nausea  Requesting Physician:  ER MRN #:  [id number]  History of Present Illness: This is a 78 y.o. male who underwent aortobifemoral bypass and revision of right femoral to popliteal bypass graft using 57mm Gore-tex interposition graft on 07/06/2018 by [name].  His post op course was complicated by acalculous cholecystitis and colonic ileus and required a cholecystostomy tube with bilious drainage.  He was discharged Saturday.  He states that on Sunday he was doing well and walked a "300 paces".    Around Monday, he started feeling worse and become progressively nauseated and had some dry heaving.  He states that he has drank and eaten very little.  All smells are nauseating and nothing tastes good.  He has had a bowel movement in the past couple of days.  He had a dulcolax yesterday.  He states that the [facility] took his temperature and it was around 100 but when they retook his temp, it was 97.  He has had some chills.  He denies any pain in his legs or feet.      His wife states that she has been flushing his cholecystostomy tube with saline but has not had to drain it since Sunday.    Past Medical History:  Diagnosis Date  . Anemia   . Complication of anesthesia   . Diabetes mellitus ([place])    TYPE 2  . GERD (gastroesophageal reflux disease)   . History of blood transfusion    GI bleed  . History of colon polyps   . History of hiatal hernia   . Hyperlipemia   . Hypertension   . Osteoarthritis   . PAD (peripheral artery disease) (HCC)    a. stenting of his left common iliac artery >20 years ago. b. h/o LEIA stent and 2 stents to R SFA in 2011. c. 04/2014:  s/p PTA of right SFA for in-stent restenosis, occluded left SFA  . PONV (postoperative nausea and vomiting)    no porblem with the last 3 surgeries  . RBBB (right bundle branch block with left anterior fascicular block)    NUCLEAR STRESS TEST, 08/18/2010 - no significant wall  motion abnoramlities noted, post-stress EF 69%, normal myocardial perfusion study  . Sinus tachycardia    a. Noted during admission 04/2014 but upon review seems to be frequent finding for patient.  . Stenosis of carotid artery    a. 50% right carotid stenosis by angiogram 04/2014.    Past Surgical History:  Procedure Laterality Date  . ABDOMINAL AORTOGRAM W/LOWER EXTREMITY N/A 01/27/2017   Procedure: Abdominal Aortogram w/Lower Extremity;  Surgeon: Serafina Mitchell, MD;  Location: [place] [facility];  Service: Cardiovascular;  Laterality: N/A;  . ABDOMINAL AORTOGRAM W/LOWER EXTREMITY N/A 06/08/2017   Procedure: ABDOMINAL AORTOGRAM W/LOWER EXTREMITY;  Surgeon: Serafina Mitchell, MD;  Location: [place] [facility];  Service: Cardiovascular;  Laterality: N/A;  . ABDOMINAL AORTOGRAM W/LOWER EXTREMITY N/A 08/31/2017   Procedure: ABDOMINAL AORTOGRAM W/LOWER EXTREMITY;  Surgeon: Serafina Mitchell, MD;  Location: [place] [facility];  Service: Cardiovascular;  Laterality: N/A;  rt. unilateral  . ANGIOPLASTY / STENTING FEMORAL    . ANGIOPLASTY / STENTING ILIAC    . AORTA - BILATERAL FEMORAL ARTERY BYPASS GRAFT N/A 07/06/2018   Procedure: AORTA BIFEMORAL BYPASS GRAFT USING 14X7MM X 40CM HEMASHIELD GOLD GRAFT;  Surgeon: Serafina Mitchell, MD;  Location: [place];  Service: Vascular;  Laterality: N/A;  .  CEREBRAL ANGIOGRAM N/A 05/14/2014   Procedure: CEREBRAL ANGIOGRAM;  Surgeon: Lorretta Harp, MD;  Location: [facility] CATH LAB;  Service: Cardiovascular;  Laterality: N/A;  . COLONOSCOPY W/ POLYPECTOMY    . ENDARTERECTOMY Right 09/08/2017  . ENDARTERECTOMY FEMORAL Right 09/08/2017   Procedure: REDO RIGHT FEMORAL ENDARTECTOMY WITH PATCH ANGIOPLASTY.;  Surgeon: Serafina Mitchell, MD;  Location: [place];  Service: Vascular;  Laterality: Right;  . EYE SURGERY Bilateral    cataract  . FEMORAL ARTERY STENT Right 05/12/2010   Stented distally with a 6x100 Abbott absolute stent and proximally with a 6x60 Cook Zilver stent  resulting in the reduction of the proximal segment 80% and mid segment 60-70% to 0% residual, LEFT common femoral artery stented with a 7x3 Smart stent resulting in reduction of 90% stenosis to 0% residual  . FEMORAL-POPLITEAL BYPASS GRAFT Right 09/18/2016   Procedure: BYPASS GRAFT FEMORAL-POPLITEAL ARTERY;  Surgeon: Serafina Mitchell, MD;  Location: [place];  Service: Vascular;  Laterality: Right;  . FEMORAL-POPLITEAL BYPASS GRAFT Right 09/08/2017   Procedure: REDO BYPASS GRAFT FEMORAL-POPLITEAL ARTERY;  Surgeon: Serafina Mitchell, MD;  Location: [place];  Service: Vascular;  Laterality: Right;  . FEMORAL-POPLITEAL BYPASS GRAFT Right 07/06/2018   Procedure: REVISION RIGHT FEMORAL TO POPLITEAL ARTERY BYPASS GRAFT;  Surgeon: Serafina Mitchell, MD;  Location: [place];  Service: Vascular;  Laterality: Right;  . IR PERC CHOLECYSTOSTOMY  07/19/2018  . KNEE ARTHROSCOPY     left  . LOWER EXTREMITY ANGIOGRAM N/A 05/14/2014   Procedure: LOWER EXTREMITY ANGIOGRAM;  Surgeon: Lorretta Harp, MD;  Location: [facility] C[facility] LAB;  Service: Cardiovascular;  Laterality: N/A;  . LOWER EXTREMITY ANGIOGRAPHY N/A 09/21/2016   Procedure: Lower Extremity Angiography;  Surgeon: Conrad [place], MD;  Location: [place] CV LAB;  Service: Cardiovascular;  Laterality: N/A;  . PERIPHERAL VASCULAR ATHERECTOMY Right 01/27/2017   Procedure: PERIPHERAL VASCULAR ATHERECTOMY;  Surgeon: Serafina Mitchell, MD;  Location: [place] [facility];  Service: Cardiovascular;  Laterality: Right;  . PERIPHERAL VASCULAR BALLOON ANGIOPLASTY Right 06/08/2017   Procedure: PERIPHERAL VASCULAR BALLOON ANGIOPLASTY;  Surgeon: Serafina Mitchell, MD;  Location: [place] [facility];  Service: Cardiovascular;  Laterality: Right;  common femoral and superficial femoral arteries  . PERIPHERAL VASCULAR CATHETERIZATION N/A 04/20/2016   Procedure: Lower Extremity Intervention;  Surgeon: Lorretta Harp, MD;  Location: [place] [facility];  Service: Cardiovascular;  Laterality: N/A;  .  PERIPHERAL VASCULAR INTERVENTION  08/31/2017   Procedure: PERIPHERAL VASCULAR INTERVENTION;  Surgeon: Serafina Mitchell, MD;  Location: [place] [facility];  Service: Cardiovascular;;  REIA  . REVERSE SHOULDER ARTHROPLASTY Right 12/07/2016  . REVERSE SHOULDER ARTHROPLASTY Right 12/07/2016   Procedure: REVERSE SHOULDER ARTHROPLASTY;  Surgeon: Netta Cedars, MD;  Location: [place];  Service: Orthopedics;  Laterality: Right;  . ROTATOR CUFF REPAIR Right 2003  . SFA Right 05/14/2014   PTA  OF RT SFA         DR BERRY    Allergies  Allergen Reactions  . Asa [Aspirin] Other (See Comments)    GI bleeding  . Gadolinium Derivatives Other (See Comments)    Pt complained of face flushing/hottness and throat tightness/scratchiness immediately after the injections.  Within 4 minutes, all symptoms were gone and the study was completed.  No further complications or signs of allergy were exhibited after completion of study.   . Oxycodone-Acetaminophen Other (See Comments)    Dizziness, uncomfortable     Prior to Admission medications   Medication  Sig Start Date End Date Taking? Authorizing Provider  amoxicillin-clavulanate (AUGMENTIN) 875-125 MG tablet Take 1 tablet by mouth every 12 (twelve) hours. 07/29/18   Ulyses Amor, PA-C  clopidogrel (PLAVIX) 75 MG tablet Take 1 tablet (75 mg total) by mouth daily. 05/30/18   Lorretta Harp, MD  Coenzyme Q10 (COQ10 PO) Take 1 capsule by mouth every evening.     [provider]  glucosamine-chondroitin 500-400 MG tablet Take 1 tablet by mouth every evening.     [provider]  lisinopril (PRINIVIL,ZESTRIL) 5 MG tablet Take 5 mg by mouth 2 (two) times daily.     [provider]  metFORMIN (GLUCOPHAGE) 1000 MG tablet Take 1 tablet (1,000 mg total) by mouth 2 (two) times daily with a meal. 05/16/14   Dunn, Dayna N, PA-C  metoprolol tartrate (LOPRESSOR) 25 MG tablet Take 1 tablet (25 mg total) by mouth 2 (two) times daily. 07/29/18   Ulyses Amor, PA-C  Omega-3 Fatty Acids (FISH OIL) 1200 MG CAPS Take 1,200 mg by mouth every evening.     [provider]  ondansetron (ZOFRAN ODT) 4 MG disintegrating tablet Take 1 tablet (4 mg total) by mouth every 8 (eight) hours as needed for nausea or vomiting. 08/02/18   Serafina Mitchell, MD  ondansetron (ZOFRAN) 4 MG tablet Take 1 tablet (4 mg total) by mouth daily as needed for nausea or vomiting. 08/02/18   Serafina Mitchell, MD  pantoprazole (PROTONIX) 40 MG tablet Take 40 mg by mouth twice daily Patient taking differently: Take 40 mg by mouth 2 (two) times daily.  05/30/18   Lorretta Harp, MD  rosuvastatin (CRESTOR) 20 MG tablet Take 20 mg by mouth every evening.     [provider]  traMADol (ULTRAM) 50 MG tablet Take 1 tablet (50 mg total) by mouth every 6 (six) hours as needed. 07/29/18   Ulyses Amor, PA-C    Social History   Socioeconomic History  . Marital status: Married    Spouse name: Not on file  . Number of children: 2  . Years of education: Not on file  . Highest education level: Not on file  Occupational History  . Occupation: retired    Fish farm manager: RETIRED  Social Needs  . Financial resource strain: Not on file  . Food insecurity:    Worry: Not on file    Inability: Not on file  . Transportation needs:    Medical: Not on file    Non-medical: Not on file  Tobacco Use  . Smoking status: Former Smoker    Years: 25.00    Last attempt to quit: 06/1988    Years since quitting: 30.1  . Smokeless tobacco: Never Used  Substance and Sexual Activity  . Alcohol use: Yes    Comment: rarely  . Drug use: No  . Sexual activity: Not on file  Lifestyle  . Physical activity:    Days per week: Not on file    Minutes per session: Not on file  . Stress: Not on file  Relationships  . Social connections:    Talks on phone: Not on file    Gets together: Not on file    Attends religious service: Not on file    Active member of club or organization: Not on  file    Attends meetings of clubs or organizations: Not on file    Relationship status: Not on file  . Intimate partner violence:    Fear  of current or ex partner: Not on file    Emotionally abused: Not on file    Physically abused: Not on file    Forced sexual activity: Not on file  Other Topics Concern  . Not on file  Social History Narrative  . Not on file     Family History  Problem Relation Age of Onset  . Heart disease Mother   . Leukemia Mother   . Stomach cancer Father   . Esophageal cancer Brother   . Liver disease Brother   . Alcoholism Brother   . Leukemia Brother   . Leukemia Brother   . Diabetes type II Brother   . Lung disease Sister     ROS: [x]  Positive   [ ]  Negative   [ ]  All sytems reviewed and are negative See HPI  Physical Examination  Vitals:   08/03/18 1315 08/03/18 1330  BP: 108/60 (!) 108/53  Pulse: 98 99  Resp:    Temp:    SpO2: 100% 100%   Body mass index is 22.88 kg/m.  General:  WDWN in NAD Gait: Not observed HENT: WNL, normocephalic Pulmonary: normal non-labored breathing, without Rales, rhonchi,  wheezing Cardiac: regular Abdomen:  soft, NT/ND, no masses; decreased bowel sounds Skin: laparotomy incision is healing nicely as well as bilateral groin incisions. Vascular Exam/Pulses: + palpable DP pulses bilatearlly. Extremities: without ischemic changes, without Gangrene , without cellulitis; without open wounds;  Musculoskeletal: no muscle wasting or atrophy  Neurologic: A&O X 3;  No focal weakness or paresthesias are detected; speech is fluent/normal Psychiatric:  The pt has Normal affect.  CBC    Component Value Date/Time   WBC 11.3 (H) 08/03/2018 1220   RBC 3.15 (L) 08/03/2018 1220   HGB 9.0 (L) 08/03/2018 1220   HCT 29.3 (L) 08/03/2018 1220   PLT 505 (H) 08/03/2018 1220   MCV 93.0 08/03/2018 1220   MCH 28.6 08/03/2018 1220   MCHC 30.7 08/03/2018 1220   RDW 15.3 08/03/2018 1220   LYMPHSABS 0.8 08/03/2018 1220    MONOABS 0.7 08/03/2018 1220   EOSABS 0.1 08/03/2018 1220   BASOSABS 0.1 08/03/2018 1220    BMET    Component Value Date/Time   NA 139 08/03/2018 1220   K 4.4 08/03/2018 1220   CL 103 08/03/2018 1220   CO2 25 08/03/2018 1220   GLUCOSE 173 (H) 08/03/2018 1220   BUN 22 08/03/2018 1220   CREATININE 1.91 (H) 08/03/2018 1220   CREATININE 1.33 (H) 04/10/2016 0851   CALCIUM 8.7 (L) 08/03/2018 1220   GFRNONAA 33 (L) 08/03/2018 1220   GFRAA 38 (L) 08/03/2018 1220    COAGS: Lab Results  Component Value Date   INR 1.25 07/06/2018   INR 1.04 07/01/2018   INR 1.04 09/06/2017     Non-Invasive Vascular Imaging:   2 view CXR 08/03/2018: No active cardiopulmonary disease.    ASSESSMENT/PLAN: This is a 78 y.o. male who is s/p aortobifemoral bypass and revision of right femoral to popliteal bypass graft using 6mm Gore-tex interposition graft on 07/06/2018 by [name].  His post op course was complicated by acalculous cholecystitis and colonic ileus and required a cholecystostomy tube with bilious drainage.   -pt brought to ED today with c/o nausea and dry heaving and failure to thrive.  -his creatinine is elevated today-most likely dehydrated.  He received fluid bolus in ER.   -mild leukocytosis:  Pt had fever of 100 but otherwise feels he has been afebrile.  His laparotomy and  bilateral groin incisions look good and healing nicely.   -May benefit from general surgery consult given cholecystitis and cholecystostomy tube placement.  -[name] will be by to evaluate pt this afternoon.    [name], PA-C [facility] [phone]  Patient to be admitted due to failure to thrive at home He has had subjective fevers and has been nauseated. He states his billi drain output has been very low since Sunday CT scan today was unremarkable  Plan: Admit for failure to thrive -will hydrate overnight.  Creatinine elevated and he received IV contrast for CT today.  I will  start IVF at 100 cc/hr and repeat labs in am -billi drain output low since Sunday, I will order a drain study for tomorrow to assess patency.  NPO after midnight -add scopalamine patch for nausea along with Zofran -PT and OT evaluation tomorrow.  [name]

## 2018-08-03 NOTE — ED Provider Notes (Signed)
[facility] EMERGENCY DEPARTMENT Provider Note   CSN: [id number] Arrival date & time: 08/03/18  1151     History   Chief Complaint Chief Complaint  Patient presents with  . Nausea    HPI Christian Bailey is a 78 y.o. male with a past medical history of PVD with claudication status post revision of right femoral to popliteal artery bypass graft and aortobifemoral bypass graft 1 month ago, discharged on 07/31/2018 after 24-day hospital stay presents to ED for generalized weakness, nausea, decreased appetite, cough and vomiting.  Patient states that he has an aversion to food, will only eat several bites a day.  Majority of history is provided by son at bedside.  States that they spoke to [name], vascular surgery who called in a prescription of Zofran.  Patient took Zofran yesterday with no specific change to his nausea or decreased appetite.  Son has been checking blood sugars and have been normal.  He states that he just "does not want to eat anything."  He has a percutaneous cholecystostomy catheter placement on 07/19/2018 and is scheduled to follow-up in 2 weeks for removal with surgery.  He had some right middle quadrant abdominal pain which resolved after a bowel movement today.  He has had 2 normal bowel movements today.  He denies fevers.  Denies any urinary symptoms, chest pain, shortness of breath.  States that he is coughing up mucus and that he is vomiting up mucus as well.  Denies any leg swelling, headache, vision changes or falls.  HPI  Past Medical History:  Diagnosis Date  . Anemia   . Complication of anesthesia   . Diabetes mellitus ([place])    TYPE 2  . GERD (gastroesophageal reflux disease)   . History of blood transfusion    GI bleed  . History of colon polyps   . History of hiatal hernia   . Hyperlipemia   . Hypertension   . Osteoarthritis   . PAD (peripheral artery disease) ([facility])    a. stenting of his left common iliac artery >20 years ago. b. h/o  LEIA stent and 2 stents to R SFA in 2011. c. 04/2014:  s/p PTA of right SFA for in-stent restenosis, occluded left SFA  . PONV (postoperative nausea and vomiting)    no porblem with the last 3 surgeries  . RBBB (right bundle branch block with left anterior fascicular block)    NUCLEAR STRESS TEST, 08/18/2010 - no significant wall motion abnoramlities noted, post-stress EF 69%, normal myocardial perfusion study  . Sinus tachycardia    a. Noted during admission 04/2014 but upon review seems to be frequent finding for patient.  . Stenosis of carotid artery    a. 50% right carotid stenosis by angiogram 04/2014.    Patient Active Problem List   Diagnosis Date Noted  . Stenosis of infrarenal abdominal aorta due to atherosclerosis ([place]) 07/06/2018  . S/P shoulder replacement, right 12/07/2016  . PVD (peripheral vascular disease) ([place]) 09/18/2016  . Sinus tachycardia 05/15/2014  . Carotid artery disease ([place]) 04/26/2014  . PAD (peripheral artery disease), hx remote stent Lt common iliac, 04/2010-stent to lt common iliac & 2 stents to the Rt SFA, 04/2014 - s/p PTA of right SFA for in-stent restenosis  06/12/2013  . 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 Claudication ([place]) 12/14/2012  . Essential hypertension 12/14/2012  . Hyperlipidemia 12/14/2012  . Type 2 diabetes mellitus ([place]) 12/14/2012  . Personal history  of colonic polyps 01/25/2012  . DM 08/11/2010  . DUODENAL ULCER, ACUTE, HEMORRHAGE 08/11/2010    Past Surgical History:  Procedure Laterality Date  . ABDOMINAL AORTOGRAM W/LOWER EXTREMITY N/A 01/27/2017   Procedure: Abdominal Aortogram w/Lower Extremity;  Surgeon: Serafina Mitchell, MD;  Location: [facility];  Service: Cardiovascular;  Laterality: N/A;  . ABDOMINAL AORTOGRAM W/LOWER EXTREMITY N/A 06/08/2017   Procedure: ABDOMINAL AORTOGRAM W/LOWER EXTREMITY;  Surgeon: Serafina Mitchell, MD;  Location: [facility];  Service:  Cardiovascular;  Laterality: N/A;  . ABDOMINAL AORTOGRAM W/LOWER EXTREMITY N/A 08/31/2017   Procedure: ABDOMINAL AORTOGRAM W/LOWER EXTREMITY;  Surgeon: Serafina Mitchell, MD;  Location: [facility];  Service: Cardiovascular;  Laterality: N/A;  rt. unilateral  . ANGIOPLASTY / STENTING FEMORAL    . ANGIOPLASTY / STENTING ILIAC    . AORTA - BILATERAL FEMORAL ARTERY BYPASS GRAFT N/A 07/06/2018   Procedure: AORTA BIFEMORAL BYPASS GRAFT USING 14X7MM X 40CM HEMASHIELD GOLD GRAFT;  Surgeon: Serafina Mitchell, MD;  Location: [facility] [facility];  Service: Vascular;  Laterality: N/A;  . CEREBRAL ANGIOGRAM N/A 05/14/2014   Procedure: CEREBRAL ANGIOGRAM;  Surgeon: Lorretta Harp, MD;  Location: [facility] CATH LAB;  Service: Cardiovascular;  Laterality: N/A;  . COLONOSCOPY W/ POLYPECTOMY    . ENDARTERECTOMY Right 09/08/2017  . ENDARTERECTOMY FEMORAL Right 09/08/2017   Procedure: REDO RIGHT FEMORAL ENDARTECTOMY WITH PATCH ANGIOPLASTY.;  Surgeon: Serafina Mitchell, MD;  Location: [facility];  Service: Vascular;  Laterality: Right;  . EYE SURGERY Bilateral    cataract  . FEMORAL ARTERY STENT Right 05/12/2010   Stented distally with a 6x100 Abbott absolute stent and proximally with a 6x60 Cook Zilver stent resulting in the reduction of the proximal segment 80% and mid segment 60-70% to 0% residual, LEFT common femoral artery stented with a 7x3 Smart stent resulting in reduction of 90% stenosis to 0% residual  . FEMORAL-POPLITEAL BYPASS GRAFT Right 09/18/2016   Procedure: BYPASS GRAFT FEMORAL-POPLITEAL ARTERY;  Surgeon: Serafina Mitchell, MD;  Location: [facility];  Service: Vascular;  Laterality: Right;  . FEMORAL-POPLITEAL BYPASS GRAFT Right 09/08/2017   Procedure: REDO BYPASS GRAFT FEMORAL-POPLITEAL ARTERY;  Surgeon: Serafina Mitchell, MD;  Location: [facility];  Service: Vascular;  Laterality: Right;  . FEMORAL-POPLITEAL BYPASS GRAFT Right 07/06/2018   Procedure: REVISION RIGHT FEMORAL TO POPLITEAL ARTERY BYPASS GRAFT;  Surgeon: Serafina Mitchell,  MD;  Location: [facility];  Service: Vascular;  Laterality: Right;  . IR PERC CHOLECYSTOSTOMY  07/19/2018  . KNEE ARTHROSCOPY     left  . LOWER EXTREMITY ANGIOGRAM N/A 05/14/2014   Procedure: LOWER EXTREMITY ANGIOGRAM;  Surgeon: Lorretta Harp, MD;  Location[facility] C[facility];  Service: Cardiovascular;  Laterality: N/A;  . LOWER EXTREMITY ANGIOGRAPHY N/A 09/21/2016   Procedure: Lower Extremity Angiography;  Surgeon: Conrad Guilford, MD;  Location: [place] CV LAB;  Service: Cardiovascular;  Laterality: N/A;  . PERIPHERAL VASCULAR ATHERECTOMY Right 01/27/2017   Procedure: PERIPHERAL VASCULAR ATHERECTOMY;  Surgeon: Serafina Mitchell, MD;  Location: [place] [facility];  Service: Cardiovascular;  Laterality: Right;  . PERIPHERAL VASCULAR BALLOON ANGIOPLASTY Right 06/08/2017   Procedure: PERIPHERAL VASCULAR BALLOON ANGIOPLASTY;  Surgeon: Serafina Mitchell, MD;  Location: [facility];  Service: Cardiovascular;  Laterality: Right;  common femoral and superficial femoral arteries  . PERIPHERAL VASCULAR CATHETERIZATION N/A 04/20/2016   Procedure: Lower Extremity Intervention;  Surgeon: Lorretta Harp, MD;  Location: [place] [facility];  Service: Cardiovascular;  Laterality:  N/A;  . PERIPHERAL VASCULAR INTERVENTION  08/31/2017   Procedure: PERIPHERAL VASCULAR INTERVENTION;  Surgeon: Serafina Mitchell, MD;  Location: [place] [facility];  Service: Cardiovascular;;  REIA  . REVERSE SHOULDER ARTHROPLASTY Right 12/07/2016  . REVERSE SHOULDER ARTHROPLASTY Right 12/07/2016   Procedure: REVERSE SHOULDER ARTHROPLASTY;  Surgeon: Netta Cedars, MD;  Location: [facility];  Service: Orthopedics;  Laterality: Right;  . ROTATOR CUFF REPAIR Right 2003  . SFA Right 05/14/2014   PTA  OF RT SFA         DR BERRY        Home Medications    Prior to Admission medications   Medication Sig Start Date End Date Taking? Authorizing Provider  amoxicillin-clavulanate (AUGMENTIN) 875-125 MG tablet Take 1 tablet by mouth every 12 (twelve)  hours. 07/29/18   Ulyses Amor, PA-C  clopidogrel (PLAVIX) 75 MG tablet Take 1 tablet (75 mg total) by mouth daily. 05/30/18   Lorretta Harp, MD  Coenzyme Q10 (COQ10 PO) Take 1 capsule by mouth every evening.     [provider]  glucosamine-chondroitin 500-400 MG tablet Take 1 tablet by mouth every evening.     [provider]  lisinopril (PRINIVIL,ZESTRIL) 5 MG tablet Take 5 mg by mouth 2 (two) times daily.     [provider]  metFORMIN (GLUCOPHAGE) 1000 MG tablet Take 1 tablet (1,000 mg total) by mouth 2 (two) times daily with a meal. 05/16/14   Dunn, Dayna N, PA-C  metoprolol tartrate (LOPRESSOR) 25 MG tablet Take 1 tablet (25 mg total) by mouth 2 (two) times daily. 07/29/18   Ulyses Amor, PA-C  Omega-3 Fatty Acids (FISH OIL) 1200 MG CAPS Take 1,200 mg by mouth every evening.     [provider]  ondansetron (ZOFRAN ODT) 4 MG disintegrating tablet Take 1 tablet (4 mg total) by mouth every 8 (eight) hours as needed for nausea or vomiting. 08/02/18   Serafina Mitchell, MD  ondansetron (ZOFRAN) 4 MG tablet Take 1 tablet (4 mg total) by mouth daily as needed for nausea or vomiting. 08/02/18   Serafina Mitchell, MD  pantoprazole (PROTONIX) 40 MG tablet Take 40 mg by mouth twice daily Patient taking differently: Take 40 mg by mouth 2 (two) times daily.  05/30/18   Lorretta Harp, MD  rosuvastatin (CRESTOR) 20 MG tablet Take 20 mg by mouth every evening.     [provider]  traMADol (ULTRAM) 50 MG tablet Take 1 tablet (50 mg total) by mouth every 6 (six) hours as needed. 07/29/18   Ulyses Amor, PA-C    Family History Family History  Problem Relation Age of Onset  . Heart disease Mother   . Leukemia Mother   . Stomach cancer Father   . Esophageal cancer Brother   . Liver disease Brother   . Alcoholism Brother   . Leukemia Brother   . Leukemia Brother   . Diabetes type II Brother   . Lung disease Sister     Social History Social History    Tobacco Use  . Smoking status: Former Smoker    Years: 25.00    Last attempt to quit: 06/1988    Years since quitting: 30.1  . Smokeless tobacco: Never Used  Substance Use Topics  . Alcohol use: Yes    Comment: rarely  . Drug use: No     Allergies   Asa [aspirin]; Gadolinium derivatives; and Oxycodone-acetaminophen   Review of Systems Review of Systems  Constitutional: Positive for appetite change and fatigue. Negative for chills and fever.  HENT: Negative for ear pain, rhinorrhea, sneezing and sore throat.   Eyes: Negative for photophobia and visual disturbance.  Respiratory: Positive for cough. Negative for chest tightness, shortness of breath and wheezing.   Cardiovascular: Negative for chest pain and palpitations.  Gastrointestinal: Positive for nausea and vomiting. Negative for abdominal pain, blood in stool, constipation and diarrhea.  Genitourinary: Negative for dysuria, hematuria and urgency.  Musculoskeletal: Negative for myalgias.  Skin: Negative for rash.  Neurological: Negative for dizziness, weakness and light-headedness.     Physical Exam Updated Vital Signs BP (!) 103/59   Pulse (!) 103   Temp 97.6 F (36.4 C) (Oral)   Resp 14   Ht 5\' 5"  (1.651 m)   Wt 62.4 kg   SpO2 100%   BMI 22.88 kg/m   Physical Exam Vitals signs and nursing note reviewed.  Constitutional:      General: He is not in acute distress.    Appearance: He is well-developed.  HENT:     Head: Normocephalic and atraumatic.     Nose: Nose normal.  Eyes:     General: No scleral icterus.       Right eye: No discharge.        Left eye: No discharge.     Conjunctiva/sclera: Conjunctivae normal.     Pupils: Pupils are equal, round, and reactive to light.  Neck:     Musculoskeletal: Normal range of motion and neck supple.  Cardiovascular:     Rate and Rhythm: Normal rate and regular rhythm.     Heart sounds: Normal heart sounds. No murmur. No friction rub. No gallop.   Pulmonary:      Effort: Pulmonary effort is normal. No respiratory distress.     Breath sounds: Normal breath sounds.  Abdominal:     General: Bowel sounds are normal. There is no distension.     Palpations: Abdomen is soft.     Tenderness: There is no abdominal tenderness. There is no guarding.     Comments: Right upper quadrant drain in place.  Musculoskeletal: Normal range of motion.     Right lower leg: No edema.     Left lower leg: No edema.     Comments: No lower extremity edema, erythema or calf tenderness bilaterally.  Skin:    General: Skin is warm and dry.     Findings: No rash.  Neurological:     Mental Status: He is alert.     Motor: No abnormal muscle tone.     Coordination: Coordination normal.      ED Treatments / Results  Labs (all labs ordered are listed, but only abnormal results are displayed) Labs Reviewed  COMPREHENSIVE METABOLIC PANEL - Abnormal; Notable for the following components:      Result Value   Glucose, Bld 173 (*)    Creatinine, Ser 1.91 (*)    Calcium 8.7 (*)    Albumin 2.4 (*)    GFR calc non Af Amer 33 (*)    GFR calc Af Amer 38 (*)    All other components within normal limits  CBC WITH DIFFERENTIAL/PLATELET - Abnormal; Notable for the following components:   WBC 11.3 (*)    RBC 3.15 (*)    Hemoglobin 9.0 (*)    HCT 29.3 (*)    Platelets 505 (*)    Neutro Abs 9.5 (*)    Abs Immature Granulocytes 0.13 (*)  All other components within normal limits  URINE CULTURE  LIPASE, BLOOD  URINALYSIS, ROUTINE W REFLEX MICROSCOPIC    EKG None  Radiology Dg Chest 2 View  Result Date: 08/03/2018 CLINICAL DATA:  Nausea and vomiting. EXAM: CHEST - 2 VIEW COMPARISON:  Chest x-ray dated July 07, 2018. FINDINGS: The heart size and mediastinal contours are within normal limits. Normal pulmonary vascularity. No focal consolidation, pleural effusion, or pneumothorax. Bilateral nipple shadows. No acute osseous abnormality. Prior right reverse total shoulder  arthroplasty. Right upper quadrant percutaneous cholecystostomy tube. IMPRESSION: No active cardiopulmonary disease. Electronically Signed   By: Titus Dubin M.D.   On: 08/03/2018 13:08    Procedures Procedures (including critical care time)  Medications Ordered in ED Medications  sodium chloride 0.9 % bolus 1,000 mL (0 mLs Intravenous Stopped 08/03/18 1337)  sodium chloride 0.9 % bolus 500 mL (500 mLs Intravenous New Bag/Given 08/03/18 1501)  ondansetron (ZOFRAN) injection 4 mg (4 mg Intravenous Given 08/03/18 1501)  famotidine (PEPCID) IVPB 20 mg premix (0 mg Intravenous Stopped 08/03/18 1636)  iohexol (OMNIPAQUE) 300 MG/ML solution 80 mL (80 mLs Intravenous Contrast Given 08/03/18 1658)     Initial Impression / Assessment and Plan / ED Course  I have reviewed the triage vital signs and the nursing notes.  Pertinent labs & imaging results that were available during my care of the patient were reviewed by me and considered in my medical decision making (see chart for details).  Clinical Course as of Aug 04 1723  Wed Aug 03, 5530  [id number] 78 year old male with a prolonged hospitalization last month for femoropopliteal bypass complicated by cholecystitis.  He just got discharged Saturday and has had progressively worsening nausea and vomiting.  They talked to the vascular surgery office and was prescribed some antiemetics without any improvement in his symptoms.  He denies any significant abdominal pain is been moving his bowels okay.  We are rechecking some screening labs and will review with vascular surgery.   [MB]  58 Spoke to Dr. Donnetta Hutching of vascular surgery.  I made him aware that the patient is in the ED as [name] did send him over.  No recommendations at this time.   [HK]    Clinical Course User Index [HK] Delia Heady, PA-C [MB] Hayden Rasmussen, MD    78 year old male who is status post femoropopliteal bypass complicated by cholecystitis with a 24-day hospital stay presents to ED  for nausea, vomiting, decreased appetite and generalized weakness.  No improvement with Zofran that he took yesterday.  He has had normal bowel movements.  Lab work significant for creatinine of 1.9 which is elevated from 1.5 at discharge.  Mild leukocytosis at 11, hemoglobin stable at 9.  Lipase unremarkable.  Urinalysis is unremarkable.  Chest x-ray with no acute findings. CT shows patent graft, improved gallbladder appearance.  Spoke to vascular surgery and [name] will admit patient to his service.   Portions of this note were generated with [employer]. Dictation errors may occur despite best attempts at proofreading.   Final Clinical Impressions(s) / ED Diagnoses   Final diagnoses:  None    ED Discharge Orders    None       Delia Heady, PA-C 08/03/18 2118    Hayden Rasmussen, MD 08/04/18 [phone]

## 2018-08-03 NOTE — Progress Notes (Signed)
Patient received from ED, A&Ox4, VSS. Telemetry applied and CCMD notified. Oriented to room and call light placed within reach.

## 2018-08-03 NOTE — ED Triage Notes (Signed)
Pt presents to ED with nausea lasting several days following was discharge from hospital on Saturday after 24 day admission with extensive abd surgery.  Pt has biliary drain in place upon arrival.   Pt has tolerated some food and little liquids.  C/o decreased appetite.  Pt reports mostly dry heaving.  Pt is A&Ox4.  Pt denies pain upon arrival to ED.  C/o generalized weakness.

## 2018-08-04 ENCOUNTER — Encounter (HOSPITAL_COMMUNITY): Payer: Self-pay | Admitting: Interventional Radiology

## 2018-08-04 ENCOUNTER — Inpatient Hospital Stay (HOSPITAL_COMMUNITY): Payer: Medicare Other

## 2018-08-04 HISTORY — PX: IR CHOLANGIOGRAM EXISTING TUBE: IMG6040

## 2018-08-04 LAB — MRSA PCR SCREENING: MRSA BY PCR: NEGATIVE

## 2018-08-04 LAB — GLUCOSE, CAPILLARY
GLUCOSE-CAPILLARY: 82 mg/dL (ref 70–99)
Glucose-Capillary: 71 mg/dL (ref 70–99)
Glucose-Capillary: 80 mg/dL (ref 70–99)
Glucose-Capillary: 93 mg/dL (ref 70–99)

## 2018-08-04 LAB — URINE CULTURE: Culture: NO GROWTH

## 2018-08-04 LAB — CREATININE, SERUM
Creatinine, Ser: 1.55 mg/dL — ABNORMAL HIGH (ref 0.61–1.24)
GFR calc Af Amer: 49 mL/min — ABNORMAL LOW (ref >60)
GFR calc non Af Amer: 43 mL/min — ABNORMAL LOW (ref >60)

## 2018-08-04 LAB — CBC
HCT: 25.5 % — ABNORMAL LOW (ref 39.0–52.0)
Hemoglobin: 7.9 g/dL — ABNORMAL LOW (ref 13.0–17.0)
MCH: 28.4 pg (ref 26.0–34.0)
MCHC: 31 g/dL (ref 30.0–36.0)
MCV: 91.7 fL (ref 80.0–100.0)
Platelets: 374 10*3/uL (ref 150–400)
RBC: 2.78 MIL/uL — ABNORMAL LOW (ref 4.22–5.81)
RDW: 15.5 % (ref 11.5–15.5)
WBC: 7.7 10*3/uL (ref 4.0–10.5)
nRBC: 0 % (ref 0.0–0.2)

## 2018-08-04 MED ORDER — IOPAMIDOL (ISOVUE-300) INJECTION 61%
INTRAVENOUS | Status: AC
  Administered 2018-08-04: 10 mL
  Filled 2018-08-04: qty 50

## 2018-08-04 NOTE — H&P (Signed)
Hospital Consult    Reason for Consult:  S/p AAA with nausea  Requesting Physician:  ER MRN #:  [id number]  History of Present Illness: This is a 78 y.o. male who underwent aortobifemoral bypass and revision of right femoral to popliteal bypass graft using 52mm Gore-tex interposition graft on 07/06/2018 by [name].  His post op course was complicated by acalculous cholecystitis and colonic ileus and required a cholecystostomy tube with bilious drainage.  He was discharged Saturday.  He states that on Sunday he was doing well and walked a "300 paces".    Around Monday, he started feeling worse and become progressively nauseated and had some dry heaving.  He states that he has drank and eaten very little.  All smells are nauseating and nothing tastes good.  He has had a bowel movement in the past couple of days.  He had a dulcolax yesterday.  He states that the [facility] took his temperature and it was around 100 but when they retook his temp, it was 97.  He has had some chills.  He denies any pain in his legs or feet.      His wife states that she has been flushing his cholecystostomy tube with saline but has not had to drain it since Sunday.    Past Medical History:  Diagnosis Date  . Anemia   . Complication of anesthesia   . Diabetes mellitus ([place])    TYPE 2  . GERD (gastroesophageal reflux disease)   . History of blood transfusion    GI bleed  . History of colon polyps   . History of hiatal hernia   . Hyperlipemia   . Hypertension   . Osteoarthritis   . PAD (peripheral artery disease) (H[facility])    a. stenting of his left common iliac artery >20 years ago. b. h/o LEIA stent and 2 stents to R SFA in 2011. c. 04/2014:  s/p PTA of right SFA for in-stent restenosis, occluded left SFA  . PONV (postoperative nausea and vomiting)    no porblem with the last 3 surgeries  . RBBB (right bundle branch block with left anterior fascicular block)    NUCLEAR STRESS TEST, 08/18/2010 - no significant wall  motion abnoramlities noted, post-stress EF 69%, normal myocardial perfusion study  . Sinus tachycardia    a. Noted during admission 04/2014 but upon review seems to be frequent finding for patient.  . Stenosis of carotid artery    a. 50% right carotid stenosis by angiogram 04/2014.    Past Surgical History:  Procedure Laterality Date  . ABDOMINAL AORTOGRAM W/LOWER EXTREMITY N/A 01/27/2017   Procedure: Abdominal Aortogram w/Lower Extremity;  Surgeon: Serafina Mitchell, MD;  Location: [place] [facility];  Service: Cardiovascular;  Laterality: N/A;  . ABDOMINAL AORTOGRAM W/LOWER EXTREMITY N/A 06/08/2017   Procedure: ABDOMINAL AORTOGRAM W/LOWER EXTREMITY;  Surgeon: Serafina Mitchell, MD;  Location: [place] [facility];  Service: Cardiovascular;  Laterality: N/A;  . ABDOMINAL AORTOGRAM W/LOWER EXTREMITY N/A 08/31/2017   Procedure: ABDOMINAL AORTOGRAM W/LOWER EXTREMITY;  Surgeon: Serafina Mitchell, MD;  Location: [place] [facility];  Service: Cardiovascular;  Laterality: N/A;  rt. unilateral  . ANGIOPLASTY / STENTING FEMORAL    . ANGIOPLASTY / STENTING ILIAC    . AORTA - BILATERAL FEMORAL ARTERY BYPASS GRAFT N/A 07/06/2018   Procedure: AORTA BIFEMORAL BYPASS GRAFT USING 14X7MM X 40CM HEMASHIELD GOLD GRAFT;  Surgeon: Serafina Mitchell, MD;  Location: [place];  Service: Vascular;  Laterality: N/A;  .  CEREBRAL ANGIOGRAM N/A 05/14/2014   Procedure: CEREBRAL ANGIOGRAM;  Surgeon: Lorretta Harp, MD;  Location: [facility] CATH LAB;  Service: Cardiovascular;  Laterality: N/A;  . COLONOSCOPY W/ POLYPECTOMY    . ENDARTERECTOMY Right 09/08/2017  . ENDARTERECTOMY FEMORAL Right 09/08/2017   Procedure: REDO RIGHT FEMORAL ENDARTECTOMY WITH PATCH ANGIOPLASTY.;  Surgeon: Serafina Mitchell, MD;  Location: [place];  Service: Vascular;  Laterality: Right;  . EYE SURGERY Bilateral    cataract  . FEMORAL ARTERY STENT Right 05/12/2010   Stented distally with a 6x100 Abbott absolute stent and proximally with a 6x60 Cook Zilver stent  resulting in the reduction of the proximal segment 80% and mid segment 60-70% to 0% residual, LEFT common femoral artery stented with a 7x3 Smart stent resulting in reduction of 90% stenosis to 0% residual  . FEMORAL-POPLITEAL BYPASS GRAFT Right 09/18/2016   Procedure: BYPASS GRAFT FEMORAL-POPLITEAL ARTERY;  Surgeon: Serafina Mitchell, MD;  Location: [place];  Service: Vascular;  Laterality: Right;  . FEMORAL-POPLITEAL BYPASS GRAFT Right 09/08/2017   Procedure: REDO BYPASS GRAFT FEMORAL-POPLITEAL ARTERY;  Surgeon: Serafina Mitchell, MD;  Location: [place];  Service: Vascular;  Laterality: Right;  . FEMORAL-POPLITEAL BYPASS GRAFT Right 07/06/2018   Procedure: REVISION RIGHT FEMORAL TO POPLITEAL ARTERY BYPASS GRAFT;  Surgeon: Serafina Mitchell, MD;  Location: [place];  Service: Vascular;  Laterality: Right;  . IR PERC CHOLECYSTOSTOMY  07/19/2018  . KNEE ARTHROSCOPY     left  . LOWER EXTREMITY ANGIOGRAM N/A 05/14/2014   Procedure: LOWER EXTREMITY ANGIOGRAM;  Surgeon: Lorretta Harp, MD;  Location: [facility] CATH LAB;  Service: Cardiovascular;  Laterality: N/A;  . LOWER EXTREMITY ANGIOGRAPHY N/A 09/21/2016   Procedure: Lower Extremity Angiography;  Surgeon: Conrad Rushmere, MD;  Location: [place] [facility];  Service: Cardiovascular;  Laterality: N/A;  . PERIPHERAL VASCULAR ATHERECTOMY Right 01/27/2017   Procedure: PERIPHERAL VASCULAR ATHERECTOMY;  Surgeon: Serafina Mitchell, MD;  Location: [place] [facility];  Service: Cardiovascular;  Laterality: Right;  . PERIPHERAL VASCULAR BALLOON ANGIOPLASTY Right 06/08/2017   Procedure: PERIPHERAL VASCULAR BALLOON ANGIOPLASTY;  Surgeon: Serafina Mitchell, MD;  Location: [place] [facility];  Service: Cardiovascular;  Laterality: Right;  common femoral and superficial femoral arteries  . PERIPHERAL VASCULAR CATHETERIZATION N/A 04/20/2016   Procedure: Lower Extremity Intervention;  Surgeon: Lorretta Harp, MD;  Location: [place] [facility];  Service: Cardiovascular;  Laterality: N/A;  .  PERIPHERAL VASCULAR INTERVENTION  08/31/2017   Procedure: PERIPHERAL VASCULAR INTERVENTION;  Surgeon: Serafina Mitchell, MD;  Location: [place] [facility];  Service: Cardiovascular;;  REIA  . REVERSE SHOULDER ARTHROPLASTY Right 12/07/2016  . REVERSE SHOULDER ARTHROPLASTY Right 12/07/2016   Procedure: REVERSE SHOULDER ARTHROPLASTY;  Surgeon: Netta Cedars, MD;  Location: [place];  Service: Orthopedics;  Laterality: Right;  . ROTATOR CUFF REPAIR Right 2003  . SFA Right 05/14/2014   PTA  OF RT SFA         DR BERRY    Allergies  Allergen Reactions  . Asa [Aspirin] Other (See Comments)    GI bleeding  . Gadolinium Derivatives Other (See Comments)    Pt complained of face flushing/hottness and throat tightness/scratchiness immediately after the injections.  Within 4 minutes, all symptoms were gone and the study was completed.  No further complications or signs of allergy were exhibited after completion of study.   . Oxycodone-Acetaminophen Other (See Comments)    Dizziness, uncomfortable     Prior to Admission medications   Medication  Sig Start Date End Date Taking? Authorizing Provider  amoxicillin-clavulanate (AUGMENTIN) 875-125 MG tablet Take 1 tablet by mouth every 12 (twelve) hours. 07/29/18   Ulyses Amor, PA-C  clopidogrel (PLAVIX) 75 MG tablet Take 1 tablet (75 mg total) by mouth daily. 05/30/18   Lorretta Harp, MD  Coenzyme Q10 (COQ10 PO) Take 1 capsule by mouth every evening.     [provider]  glucosamine-chondroitin 500-400 MG tablet Take 1 tablet by mouth every evening.     [provider]  lisinopril (PRINIVIL,ZESTRIL) 5 MG tablet Take 5 mg by mouth 2 (two) times daily.     [provider]  metFORMIN (GLUCOPHAGE) 1000 MG tablet Take 1 tablet (1,000 mg total) by mouth 2 (two) times daily with a meal. 05/16/14   Dunn, Dayna N, PA-C  metoprolol tartrate (LOPRESSOR) 25 MG tablet Take 1 tablet (25 mg total) by mouth 2 (two) times daily. 07/29/18   Ulyses Amor, PA-C  Omega-3 Fatty Acids (FISH OIL) 1200 MG CAPS Take 1,200 mg by mouth every evening.     [provider]  ondansetron (ZOFRAN ODT) 4 MG disintegrating tablet Take 1 tablet (4 mg total) by mouth every 8 (eight) hours as needed for nausea or vomiting. 08/02/18   Serafina Mitchell, MD  ondansetron (ZOFRAN) 4 MG tablet Take 1 tablet (4 mg total) by mouth daily as needed for nausea or vomiting. 08/02/18   Serafina Mitchell, MD  pantoprazole (PROTONIX) 40 MG tablet Take 40 mg by mouth twice daily Patient taking differently: Take 40 mg by mouth 2 (two) times daily.  05/30/18   Lorretta Harp, MD  rosuvastatin (CRESTOR) 20 MG tablet Take 20 mg by mouth every evening.     [provider]  traMADol (ULTRAM) 50 MG tablet Take 1 tablet (50 mg total) by mouth every 6 (six) hours as needed. 07/29/18   Ulyses Amor, PA-C    Social History   Socioeconomic History  . Marital status: Married    Spouse name: Not on file  . Number of children: 2  . Years of education: Not on file  . Highest education level: Not on file  Occupational History  . Occupation: retired    Fish farm manager: RETIRED  Social Needs  . Financial resource strain: Not on file  . Food insecurity:    Worry: Not on file    Inability: Not on file  . Transportation needs:    Medical: Not on file    Non-medical: Not on file  Tobacco Use  . Smoking status: Former Smoker    Years: 25.00    Last attempt to quit: 06/1988    Years since quitting: 30.1  . Smokeless tobacco: Never Used  Substance and Sexual Activity  . Alcohol use: Yes    Comment: rarely  . Drug use: No  . Sexual activity: Not on file  Lifestyle  . Physical activity:    Days per week: Not on file    Minutes per session: Not on file  . Stress: Not on file  Relationships  . Social connections:    Talks on phone: Not on file    Gets together: Not on file    Attends religious service: Not on file    Active member of club or organization: Not on  file    Attends meetings of clubs or organizations: Not on file    Relationship status: Not on file  . Intimate partner violence:    Fear  of current or ex partner: Not on file    Emotionally abused: Not on file    Physically abused: Not on file    Forced sexual activity: Not on file  Other Topics Concern  . Not on file  Social History Narrative  . Not on file     Family History  Problem Relation Age of Onset  . Heart disease Mother   . Leukemia Mother   . Stomach cancer Father   . Esophageal cancer Brother   . Liver disease Brother   . Alcoholism Brother   . Leukemia Brother   . Leukemia Brother   . Diabetes type II Brother   . Lung disease Sister     ROS: [x]  Positive   [ ]  Negative   [ ]  All sytems reviewed and are negative See HPI  Physical Examination  Vitals:   08/03/18 1315 08/03/18 1330  BP: 108/60 (!) 108/53  Pulse: 98 99  Resp:    Temp:    SpO2: 100% 100%   Body mass index is 22.88 kg/m.  General:  WDWN in NAD Gait: Not observed HENT: WNL, normocephalic Pulmonary: normal non-labored breathing, without Rales, rhonchi,  wheezing Cardiac: regular Abdomen:  soft, NT/ND, no masses; decreased bowel sounds Skin: laparotomy incision is healing nicely as well as bilateral groin incisions. Vascular Exam/Pulses: + palpable DP pulses bilatearlly. Extremities: without ischemic changes, without Gangrene , without cellulitis; without open wounds;  Musculoskeletal: no muscle wasting or atrophy  Neurologic: A&O X 3;  No focal weakness or paresthesias are detected; speech is fluent/normal Psychiatric:  The pt has Normal affect.  CBC    Component Value Date/Time   WBC 11.3 (H) 08/03/2018 1220   RBC 3.15 (L) 08/03/2018 1220   HGB 9.0 (L) 08/03/2018 1220   HCT 29.3 (L) 08/03/2018 1220   PLT 505 (H) 08/03/2018 1220   MCV 93.0 08/03/2018 1220   MCH 28.6 08/03/2018 1220   MCHC 30.7 08/03/2018 1220   RDW 15.3 08/03/2018 1220   LYMPHSABS 0.8 08/03/2018 1220    MONOABS 0.7 08/03/2018 1220   EOSABS 0.1 08/03/2018 1220   BASOSABS 0.1 08/03/2018 1220    BMET    Component Value Date/Time   NA 139 08/03/2018 1220   K 4.4 08/03/2018 1220   CL 103 08/03/2018 1220   CO2 25 08/03/2018 1220   GLUCOSE 173 (H) 08/03/2018 1220   BUN 22 08/03/2018 1220   CREATININE 1.91 (H) 08/03/2018 1220   CREATININE 1.33 (H) 04/10/2016 0851   CALCIUM 8.7 (L) 08/03/2018 1220   GFRNONAA 33 (L) 08/03/2018 1220   GFRAA 38 (L) 08/03/2018 1220    COAGS: Lab Results  Component Value Date   INR 1.25 07/06/2018   INR 1.04 07/01/2018   INR 1.04 09/06/2017     Non-Invasive Vascular Imaging:   2 view CXR 08/03/2018: No active cardiopulmonary disease.    ASSESSMENT/PLAN: This is a 78 y.o. male who is s/p aortobifemoral bypass and revision of right femoral to popliteal bypass graft using 44mm Gore-tex interposition graft on 07/06/2018 by [name].  His post op course was complicated by acalculous cholecystitis and colonic ileus and required a cholecystostomy tube with bilious drainage.   -pt brought to ED today with c/o nausea and dry heaving and failure to thrive.  -his creatinine is elevated today-most likely dehydrated.  He received fluid bolus in ER.   -mild leukocytosis:  Pt had fever of 100 but otherwise feels he has been afebrile.  His laparotomy and  bilateral groin incisions look good and healing nicely.   -May benefit from general surgery consult given cholecystitis and cholecystostomy tube placement.  -[name] will be by to evaluate pt this afternoon.    [name], PA-C [facility] [phone]  Patient to be admitted due to failure to thrive at home He has had subjective fevers and has been nauseated. He states his billi drain output has been very low since Sunday CT scan today was unremarkable  Plan: Admit for failure to thrive -will hydrate overnight.  Creatinine elevated and he received IV contrast for CT today.  I will  start IVF at 100 cc/hr and repeat labs in am -billi drain output low since Sunday, I will order a drain study for tomorrow to assess patency.  NPO after midnight -add scopalamine patch for nausea along with Zofran -PT and OT evaluation tomorrow.  [name]

## 2018-08-04 NOTE — Progress Notes (Addendum)
  Progress Note    08/04/2018 10:59 AM * No surgery found *  Subjective:  Says he feels much better  Afebrile HR 80's-100's afib 30'S-923'R systolic 007% RA  Vitals:   08/03/18 2337 08/04/18 0849  BP: (!) 99/53 126/80  Pulse: 80 100  Resp: 14 13  Temp: 98.6 F (37 C) 97.7 F (36.5 C)  SpO2: 100% 100%    Physical Exam: Cardiac:  irregular Lungs:  Non labored Incisions:  Healing nicely Abdomen:  Soft, NT/ND  CBC    Component Value Date/Time   WBC 7.7 08/04/2018 0256   RBC 2.78 (L) 08/04/2018 0256   HGB 7.9 (L) 08/04/2018 0256   HCT 25.5 (L) 08/04/2018 0256   PLT 374 08/04/2018 0256   MCV 91.7 08/04/2018 0256   MCH 28.4 08/04/2018 0256   MCHC 31.0 08/04/2018 0256   RDW 15.5 08/04/2018 0256   LYMPHSABS 0.8 08/03/2018 1220   MONOABS 0.7 08/03/2018 1220   EOSABS 0.1 08/03/2018 1220   BASOSABS 0.1 08/03/2018 1220    BMET    Component Value Date/Time   NA 139 08/03/2018 1220   K 4.4 08/03/2018 1220   CL 103 08/03/2018 1220   CO2 25 08/03/2018 1220   GLUCOSE 173 (H) 08/03/2018 1220   BUN 22 08/03/2018 1220   CREATININE 1.55 (H) 08/04/2018 0256   CREATININE 1.33 (H) 04/10/2016 0851   CALCIUM 8.7 (L) 08/03/2018 1220   GFRNONAA 43 (L) 08/04/2018 0256   GFRAA 49 (L) 08/04/2018 0256    INR    Component Value Date/Time   INR 1.05 08/03/2018 1848     Intake/Output Summary (Last 24 hours) at 08/04/2018 1059 Last data filed at 08/04/2018 1040 Gross per 24 hour  Intake 2235.25 ml  Output 800 ml  Net 1435.25 ml     Assessment:  79 y.o. male is s/p:  aortobifemoral bypass and revision of right femoral to popliteal bypass graft using 68mm Gore-tex interposition graft on 07/06/2018 by [name].  His post op course was complicated by acalculous cholecystitis and colonic ileus and required a cholecystostomy tube with bilious drainage and admitted for failure to thrive  Plan: -pt feeling better this morning -creatinine improved with hydration -hgb dropped-pt  tolerating-will check labs in am and hold on transfusion for now.  -RN reports pt is unsteady-PT/OT scheduled to come work with pt.  Will get pt up to chair after PT/OT -still awaiting drain study and pt is npo    [name], PA-C [facility] [phone] 08/04/2018 10:59 AM   Agree with the above Drain study reveal function drain.  Cystic duct remaoins occluded Creatinine improved with hydration Advance diet Awaiting SNF  [place] Gari Hartsell

## 2018-08-04 NOTE — Evaluation (Signed)
Occupational Therapy Evaluation Patient Details Name: Christian Bailey MRN: [id number] DOB: Nov 16, 1940 Today's Date: 08/04/2018    History of Present Illness 78 yo male with onset of N&V was admitted from dehydration and low Na+.  Has history of revascularization procedures with most recent revision with R aortofemoral bypass and revised R fem pop bypass.  PMHx:  OA, GI bleed, PAD, R BBB, tachycardia    Clinical Impression   Pt with decline in function and safety with ADLs and ADL mobility with decreased strength, balance, endurance. Pt OOB without assistance standing at EOB upon OT's arrival. Pt reported that he was just trying to straighten out his sheets and blanket. OT educated pt on calling for assist to get OOB, bed alarm was in place. Pt with Poor safety awareness and difficulty problem solving during mobility with RW, attempting to ambulate before IV pole in place and ditching RW at bathroom door and sink. Pt also required constant cues for correct hand placement when using RW. Pt would benefit from acute OT services to address impairments to maximize level of function and safety    Follow Up Recommendations  SNF;Supervision/Assistance - 24 hour(if refusing SNF, max HH therapy)    Equipment Recommendations  Other (comment)(TBD at next venue of care)    Recommendations for Other Services       Precautions / Restrictions Precautions Precautions: Fall Precaution Comments: chole drain, telemetry Restrictions Weight Bearing Restrictions: No      Mobility Bed Mobility Overal bed mobility: Needs Assistance Bed Mobility: Supine to Sit;Sit to Supine Rolling: Min guard   Supine to sit: Min assist Sit to supine: Min guard   General bed mobility comments: pt OOB at EOB without assist upon arrival, bed alarm on  Transfers Overall transfer level: Needs assistance Equipment used: Rolling walker (2 wheeled) Transfers: Sit to/from Stand Sit to Stand: Min guard         General  transfer comment: max verbal and physical for correct hand placement    Balance Overall balance assessment: Needs assistance Sitting-balance support: Feet supported Sitting balance-Leahy Scale: Fair Sitting balance - Comments: when PT extended knees to test strength, pt lists backward Postural control: Posterior lean Standing balance support: Bilateral upper extremity supported;Single extremity supported Standing balance-Leahy Scale: Poor                             ADL either performed or assessed with clinical judgement   ADL Overall ADL's : Needs assistance/impaired Eating/Feeding: NPO   Grooming: Wash/dry hands;Wash/dry face;Oral care;Min guard;Standing   Upper Body Bathing: Set up;Supervision/ safety;Sitting   Lower Body Bathing: Sit to/from stand;Moderate assistance   Upper Body Dressing : Set up;Supervision/safety;Sitting   Lower Body Dressing: Moderate assistance;Sit to/from stand   Toilet Transfer: Min guard;Ambulation;Comfort height toilet;RW;Cueing for safety;Cueing for sequencing   Toileting- Clothing Manipulation and Hygiene: Sit to/from stand;Minimal assistance       Functional mobility during ADLs: Min guard;Rolling walker;Cueing for safety       Vision Baseline Vision/History: Wears glasses;Macular Degeneration Wears Glasses: At all times Patient Visual Report: No change from baseline       Perception     Praxis      Pertinent Vitals/Pain Pain Assessment: No/denies pain Pain Score: 0-No pain Pain Descriptors / Indicators: Operative site guarding Pain Intervention(s): Monitored during session     Hand Dominance Right   Extremity/Trunk Assessment Upper Extremity Assessment Upper Extremity Assessment: Generalized weakness   Lower Extremity Assessment  Lower Extremity Assessment: Defer to PT evaluation   Cervical / Trunk Assessment Cervical / Trunk Assessment: Kyphotic   Communication Communication Communication: No  difficulties   Cognition Arousal/Alertness: Awake/alert Behavior During Therapy: WFL for tasks assessed/performed Overall Cognitive Status: Within Functional Limits for tasks assessed Area of Impairment: Safety/judgement;Problem solving;Following commands;Memory;Attention                     Memory: Decreased short-term memory Following Commands: Follows one step commands inconsistently       General Comments: pt was able to give PT details of his home equipment and help, but does admit to limited vision that makes navigating room difficult   General Comments  Pt is able to walk with cues but is in unfamiliar environment    Exercises Exercises: Other exercises(strength in hips 4-, knee flexion 4- with tight hamstrings)   Shoulder Instructions      Home Living Family/patient expects to be discharged to:: Private residence Living Arrangements: Spouse/significant other Available Help at Discharge: Family;Available 24 hours/day Type of Home: House Home Access: Stairs to enter [employer] of Steps: 3 Entrance Stairs-Rails: Left Home Layout: One level     Bathroom Shower/Tub: Occupational psychologist: Standard     Home Equipment: Shower seat - built in;Grab bars - tub/shower;Adaptive equipment Adaptive Equipment: Reacher;Long-handled sponge Additional Comments: pt was able to give PT accurate details of home situation      Prior Functioning/Environment Level of Independence: Independent with assistive device(s)        Comments: using RW baseline        OT Problem List: Impaired balance (sitting and/or standing);Decreased knowledge of use of DME or AE;Pain;Decreased activity tolerance;Decreased cognition;Decreased safety awareness      OT Treatment/Interventions: Self-care/ADL training;DME and/or AE instruction;Patient/family education;Balance training;Therapeutic activities    OT Goals(Current goals can be found in the care plan section)  Acute Rehab OT Goals Patient Stated Goal: get home and feel better OT Goal Formulation: With patient Time For Goal Achievement: 08/18/18 Potential to Achieve Goals: Good ADL Goals Pt Will Perform Grooming: with supervision;with set-up;standing Pt Will Perform Lower Body Bathing: with min assist;sit to/from stand Pt Will Perform Lower Body Dressing: with min assist;sit to/from stand Pt Will Transfer to Toilet: with supervision;ambulating;regular height toilet;grab bars Pt Will Perform Toileting - Clothing Manipulation and hygiene: with min guard assist;with supervision;sit to/from stand  OT Frequency: Min 2X/week   Barriers to D/C:            Co-evaluation              AM-PAC OT "6 Clicks" Daily Activity     Outcome Measure Help from another person eating meals?: None Help from another person taking care of personal grooming?: A Little Help from another person toileting, which includes using toliet, bedpan, or urinal?: A Little Help from another person bathing (including washing, rinsing, drying)?: A Little Help from another person to put on and taking off regular upper body clothing?: A Little Help from another person to put on and taking off regular lower body clothing?: A Lot 6 Click Score: 18   End of Session Equipment Utilized During Treatment: Gait belt;Rolling walker  Activity Tolerance: Patient tolerated treatment well Patient left: in bed;with call bell/phone within reach;with bed alarm set  OT Visit Diagnosis: Unsteadiness on feet (R26.81);Other abnormalities of gait and mobility (R26.89);Pain                Time: 9485-4627 OT Time  Calculation (min): 25 min Charges:  OT General Charges $OT Visit: 1 Visit OT Evaluation $OT Eval Moderate Complexity: 1 Mod OT Treatments $Self Care/Home Management : 8-22 mins    Britt Bottom 08/04/2018, 1:25 PM

## 2018-08-04 NOTE — Procedures (Signed)
Interventional Radiology Procedure Note  Procedure: Perc chole drain injection.   Findings: Drain is within the GB, redundant, but appropriately positioned with no kinks.   The cystic duct is occluded.     Complications: None  Recommendations:  - To gravity drain - Do not submerge - Routine drain care - routine exchange ~6-8 weeks after initial placement.    Signed,  Dulcy Fanny. Earleen Newport, DO

## 2018-08-04 NOTE — Evaluation (Signed)
Physical Therapy Evaluation Patient Details Name: Christian Bailey MRN: [id number] DOB: 05-Aug-1940 Today's Date: 08/04/2018   History of Present Illness  78 yo male with onset of N&V was admitted from dehydration and low Na+.  Has history of revascularization procedures with most recent revision with R aortofemoral bypass and revised R fem pop bypass.  PMHx:  OA, GI bleed, PAD, R BBB, tachycardia   Clinical Impression  Pt was able to work with PT today despite his nausea, and is feeling somewhat better.  He is able to navigate fairly well given his limited vision and lack of familiarity with the room.  Will see next visit as pt is able to work on stairs, specifically as he must climb 3 to go home.  Follow acutely to transition to HHPT for continued strength and balance training, and recommend thereafter to go to outpatient as needed.    Follow Up Recommendations Home health PT;Supervision for mobility/OOB    Equipment Recommendations  None recommended by PT    Recommendations for Other Services       Precautions / Restrictions Precautions Precautions: Fall Precaution Comments: chole drain, telemetry Restrictions Weight Bearing Restrictions: No      Mobility  Bed Mobility Overal bed mobility: Needs Assistance Bed Mobility: Supine to Sit;Sit to Supine Rolling: Min guard   Supine to sit: Min assist Sit to supine: Min guard      Transfers Overall transfer level: Needs assistance Equipment used: Rolling walker (2 wheeled) Transfers: Sit to/from Stand Sit to Stand: Min guard         General transfer comment: reminders for hand placement  Ambulation/Gait Ambulation/Gait assistance: Min guard(with cues for obstacles) Gait Distance (Feet): 50 Feet Assistive device: Rolling walker (2 wheeled) Gait Pattern/deviations: Step-through pattern;Wide base of support;Decreased stride length Gait velocity: decreased Gait velocity interpretation: <1.31 ft/sec, indicative of household  ambulator General Gait Details: pt is navigating in room mult trips to the door  Stairs            Wheelchair Mobility    Modified Rankin (Stroke Patients Only)       Balance Overall balance assessment: Needs assistance Sitting-balance support: Feet supported Sitting balance-Leahy Scale: Fair Sitting balance - Comments: when PT extended knees to test strength, pt lists backward                                     Pertinent Vitals/Pain Pain Assessment: 0-10 Pain Score: 1  Pain Descriptors / Indicators: Operative site guarding Pain Intervention(s): Monitored during session;Repositioned    Home Living Family/patient expects to be discharged to:: Private residence Living Arrangements: Spouse/significant other Available Help at Discharge: Family;Available 24 hours/day Type of Home: House Home Access: Stairs to enter Entrance Stairs-Rails: Left Entrance Stairs-Number of Steps: 3 Home Layout: One level Home Equipment: Shower seat - built in;Grab bars - tub/shower;Adaptive equipment Additional Comments: pt was able to give PT accurate details of home situation    Prior Function Level of Independence: Independent with assistive device(s)         Comments: using RW baseline     Hand Dominance   Dominant Hand: Right    Extremity/Trunk Assessment   Upper Extremity Assessment Upper Extremity Assessment: Overall WFL for tasks assessed    Lower Extremity Assessment Lower Extremity Assessment: Overall WFL for tasks assessed(x hips abd 4-, hamstrings very tight)    Cervical / Trunk Assessment Cervical / Trunk Assessment: Kyphotic  Communication   Communication: No difficulties  Cognition Arousal/Alertness: Awake/alert Behavior During Therapy: WFL for tasks assessed/performed Overall Cognitive Status: Within Functional Limits for tasks assessed                                 General Comments: pt was able to give PT details of his  home equipment and help, but does admit to limited vision that makes navigating room difficult      General Comments General comments (skin integrity, edema, etc.): Pt is able to walk with cues but is in unfamiliar environment    Exercises     Assessment/Plan    PT Assessment Patient needs continued PT services  PT Problem List Decreased strength;Decreased range of motion;Decreased activity tolerance;Decreased balance;Decreased coordination;Decreased mobility;Decreased knowledge of use of DME;Decreased safety awareness;Cardiopulmonary status limiting activity       PT Treatment Interventions DME instruction;Gait training;Stair training;Functional mobility training;Therapeutic activities;Therapeutic exercise;Balance training;Neuromuscular re-education;Patient/family education    PT Goals (Current goals can be found in the Care Plan section)  Acute Rehab PT Goals Patient Stated Goal: get home and feel better PT Goal Formulation: With patient Time For Goal Achievement: 08/11/18 Potential to Achieve Goals: Good    Frequency Min 4X/week   Barriers to discharge Inaccessible home environment has 3 stairs to enter house    Co-evaluation               AM-PAC PT "6 Clicks" Mobility  Outcome Measure Help needed turning from your back to your side while in a flat bed without using bedrails?: None Help needed moving from lying on your back to sitting on the side of a flat bed without using bedrails?: A Little Help needed moving to and from a bed to a chair (including a wheelchair)?: A Little Help needed standing up from a chair using your arms (e.g., wheelchair or bedside chair)?: A Little Help needed to walk in hospital room?: A Little Help needed climbing 3-5 steps with a railing? : A Lot 6 Click Score: 18    End of Session Equipment Utilized During Treatment: Gait belt(placed above drain) Activity Tolerance: Patient tolerated treatment well;Patient limited by fatigue Patient  left: in bed;with call bell/phone within reach;with bed alarm set Nurse Communication: Mobility status PT Visit Diagnosis: Unsteadiness on feet (R26.81);Muscle weakness (generalized) (M62.81) Pain - part of body: Hip    Time: 1006-1036 PT Time Calculation (min) (ACUTE ONLY): 30 min   Charges:   PT Evaluation $PT Eval Moderate Complexity: 1 Mod PT Treatments $Gait Training: 8-22 mins        Ramond Dial 08/04/2018, 11:54 AM  Mee Hives, PT MS Acute Rehab Dept. Number: [place] and [place]

## 2018-08-05 LAB — CBC
HCT: 27.9 % — ABNORMAL LOW (ref 39.0–52.0)
Hemoglobin: 8.9 g/dL — ABNORMAL LOW (ref 13.0–17.0)
MCH: 28.5 pg (ref 26.0–34.0)
MCHC: 31.9 g/dL (ref 30.0–36.0)
MCV: 89.4 fL (ref 80.0–100.0)
Platelets: 397 10*3/uL (ref 150–400)
RBC: 3.12 MIL/uL — ABNORMAL LOW (ref 4.22–5.81)
RDW: 15.3 % (ref 11.5–15.5)
WBC: 7.1 10*3/uL (ref 4.0–10.5)
nRBC: 0 % (ref 0.0–0.2)

## 2018-08-05 LAB — GLUCOSE, CAPILLARY
GLUCOSE-CAPILLARY: 90 mg/dL (ref 70–99)
Glucose-Capillary: 80 mg/dL (ref 70–99)
Glucose-Capillary: 116 mg/dL — ABNORMAL HIGH (ref 70–99)
Glucose-Capillary: 132 mg/dL — ABNORMAL HIGH (ref 70–99)

## 2018-08-05 NOTE — NC FL2 (Signed)
[place] LEVEL OF CARE SCREENING TOOL     IDENTIFICATION  Patient Name: Christian Bailey Birthdate: 1940/07/01 Sex: male Admission Date (Current Location): 08/03/2018  [facility] and [place] Number:  Herbalist and Address:  [facility], [place] [address] [place], [place] [address]      Provider Number: [id number]  Attending Physician Name and Address:  Serafina Mitchell, MD  Relative Name and Phone Number:       Current Level of Care: Hospital Recommended Level of Care: [place] Prior Approval Number:    Date Approved/Denied:   PASRR Number: [id number]  Discharge Plan: SNF    Current Diagnoses: Patient Active Problem List   Diagnosis Date Noted  . Stenosis of infrarenal abdominal aorta due to atherosclerosis ([place]) 07/06/2018  . S/P shoulder replacement, right 12/07/2016  . PVD (peripheral vascular disease) ([place]) 09/18/2016  . Sinus tachycardia 05/15/2014  . Carotid artery disease ([place]) 04/26/2014  . PAD (peripheral artery disease) ([place]) 06/12/2013  . 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 Claudication ([place]) 12/14/2012  . Essential hypertension 12/14/2012  . Hyperlipidemia 12/14/2012  . Type 2 diabetes mellitus ([place]) 12/14/2012  . Personal history of colonic polyps 01/25/2012  . DM 08/11/2010  . DUODENAL ULCER, ACUTE, HEMORRHAGE 08/11/2010    Orientation RESPIRATION BLADDER Height & Weight     Self, Time, Place, Situation  Normal Continent, External catheter Weight: 137 lb 8 oz (62.4 kg) Height:  5\' 5"  (165.1 cm)  BEHAVIORAL SYMPTOMS/MOOD NEUROLOGICAL BOWEL NUTRITION STATUS  (None) (None) Continent Diet(Carb modified)  AMBULATORY STATUS COMMUNICATION OF NEEDS Skin   Limited Assist Verbally Surgical wounds, Bruising, Other (Comment)(Skin tear.)                       Personal Care Assistance Level of Assistance  Bathing, Feeding, Dressing Bathing  Assistance: Limited assistance Feeding assistance: Independent Dressing Assistance: Limited assistance     Functional Limitations Info  Sight, Hearing, Speech Sight Info: Adequate Hearing Info: Adequate Speech Info: Adequate    SPECIAL CARE FACTORS FREQUENCY  PT (By licensed PT), OT (By licensed OT)     PT Frequency: 5 x week OT Frequency: 5 x week            Contractures Contractures Info: Not present    Additional Factors Info  Code Status, Allergies Code Status Info: Full code Allergies Info: Asa (Aspirin), Gandolinium Derivatives, Oxycodone-Acetaminophen.           Current Medications (08/05/2018):  This is the current hospital active medication list Current Facility-Administered Medications  Medication Dose Route Frequency Provider Last Rate Last Dose  . 0.9 %  sodium chloride infusion   Intravenous Continuous Dagoberto Ligas, PA-C 75 mL/hr at 08/05/18 1126    . alum & mag hydroxide-simeth (MAALOX/MYLANTA) 200-200-20 MG/5ML suspension 15-30 mL  15-30 mL Oral Q2H PRN Serafina Mitchell, MD      . amoxicillin-clavulanate (AUGMENTIN) 875-125 MG per tablet 1 tablet  1 tablet Oral Q12H Serafina Mitchell, MD   1 tablet at 08/05/18 1123  . clopidogrel (PLAVIX) tablet 75 mg  75 mg Oral Daily Serafina Mitchell, MD   75 mg at 08/05/18 1123  . guaiFENesin-dextromethorphan (ROBITUSSIN DM) 100-10 MG/5ML syrup 15 mL  15 mL Oral Q4H PRN Serafina Mitchell, MD      . heparin injection 5,000 Units  5,000 Units Subcutaneous Q8H Serafina Mitchell, MD  5,000 Units at 08/05/18 0553  . hydrALAZINE (APRESOLINE) injection 5 mg  5 mg Intravenous Q20 Min PRN Serafina Mitchell, MD      . insulin aspart (novoLOG) injection 0-9 Units  0-9 Units Subcutaneous TID WC Serafina Mitchell, MD      . labetalol (NORMODYNE,TRANDATE) injection 10 mg  10 mg Intravenous Q10 min PRN Serafina Mitchell, MD      . lisinopril (PRINIVIL,ZESTRIL) tablet 5 mg  5 mg Oral BID Serafina Mitchell, MD   5 mg at 08/05/18 1124  .  metoprolol tartrate (LOPRESSOR) injection 2-5 mg  2-5 mg Intravenous Q2H PRN Serafina Mitchell, MD      . metoprolol tartrate (LOPRESSOR) tablet 25 mg  25 mg Oral BID Serafina Mitchell, MD   25 mg at 08/05/18 1123  . omega-3 acid ethyl esters (LOVAZA) capsule 1 g  1 g Oral BID Serafina Mitchell, MD   1 g at 08/05/18 1122  . ondansetron (ZOFRAN) injection 4 mg  4 mg Intravenous Q6H PRN Serafina Mitchell, MD      . pantoprazole (PROTONIX) EC tablet 40 mg  40 mg Oral Daily Serafina Mitchell, MD   40 mg at 08/05/18 1124  . phenol (CHLORASEPTIC) mouth spray 1 spray  1 spray Mouth/Throat PRN Serafina Mitchell, MD      . potassium chloride SA (K-DUR,KLOR-CON) CR tablet 20-40 mEq  20-40 mEq Oral Once Serafina Mitchell, MD      . rosuvastatin (CRESTOR) tablet 20 mg  20 mg Oral QPM Serafina Mitchell, MD   20 mg at 08/04/18 1705  . scopolamine (TRANSDERM-SCOP) 1 MG/3DAYS 1.5 mg  1 patch Transdermal Q72H Serafina Mitchell, MD   1.5 mg at 08/03/18 2018  . traMADol (ULTRAM) tablet 50 mg  50 mg Oral Q6H PRN Serafina Mitchell, MD         Discharge Medications: Please see discharge summary for a list of discharge medications.  Relevant Imaging Results:  Relevant Lab Results:   Additional Information SS#: [id number]  Candie Chroman, LCSW

## 2018-08-05 NOTE — Care Management Important Message (Signed)
Important Message  Patient Details  Name: Christian Bailey MRN: [id number] Date of Birth: 1940/07/25   Medicare Important Message Given:  Yes    Michaeljames Milnes P Dixon 08/05/2018, 1:35 PM

## 2018-08-05 NOTE — Progress Notes (Signed)
Physical Therapy Treatment Patient Details Name: Christian Bailey MRN: [id number] DOB: 03-20-1941 Today's Date: 08/05/2018    History of Present Illness 78 yo male with onset of N&V was admitted from dehydration and low Na+.  Has history of revascularization procedures with most recent revision with R aortofemoral bypass and revised R fem pop bypass.  PMHx:  OA, GI bleed, PAD, R BBB, tachycardia     PT Comments    Pt was seen for mobility and strengthening with a signficant improvement of mobility since yesterday.  He is appropriate to change his venue from home to SNF as he is now hallucinating and is having pulses up to 119 with RW gait today.  He will require more significant monitoring to transition back to safety with all mobility and balance, and family is in agreement to ask for this change.   Follow Up Recommendations  SNF     Equipment Recommendations  None recommended by PT    Recommendations for Other Services       Precautions / Restrictions Precautions Precautions: Fall Precaution Comments: chole drain Restrictions Weight Bearing Restrictions: No    Mobility  Bed Mobility Overal bed mobility: Needs Assistance Bed Mobility: Supine to Sit Rolling: Supervision   Supine to sit: Supervision Sit to supine: Supervision      Transfers Overall transfer level: Needs assistance Equipment used: Rolling walker (2 wheeled) Transfers: Sit to/from [facility] Sit to Stand: Min guard Stand pivot transfers: Min guard       General transfer comment: steadying with cues for impulsive standing  Ambulation/Gait Ambulation/Gait assistance: Min guard(verbal cues for avoidance of obstacles) Gait Distance (Feet): 150 Feet Assistive device: Rolling walker (2 wheeled) Gait Pattern/deviations: Step-through pattern;Decreased stride length;Wide base of support Gait velocity: decreased Gait velocity interpretation: <1.31 ft/sec, indicative of household  ambulator General Gait Details: reminders for pt to stay inside the walker   Stairs             Wheelchair Mobility    Modified Rankin (Stroke Patients Only)       Balance     Sitting balance-Leahy Scale: Good       Standing balance-Leahy Scale: Fair Standing balance comment: requires UE support once dynamic                            Cognition Arousal/Alertness: Awake/alert Behavior During Therapy: WFL for tasks assessed/performed Overall Cognitive Status: Impaired/Different from baseline Area of Impairment: Safety/judgement;Awareness;Problem solving;Orientation                 Orientation Level: Place;Time;Situation Current Attention Level: Selective Memory: Decreased recall of precautions;Decreased short-term memory Following Commands: Follows one step commands inconsistently;Follows one step commands with increased time Safety/Judgement: Decreased awareness of safety;Decreased awareness of deficits Awareness: Intellectual Problem Solving: Slow processing;Requires verbal cues;Requires tactile cues General Comments: per nsg has been hallucinating      Exercises      General Comments General comments (skin integrity, edema, etc.): Pt is not aware of all obstacles but has glaucoma and requires cues for clearing tables and other furniture      Pertinent Vitals/Pain Pain Assessment: No/denies pain    Home Living                      Prior Function            PT Goals (current goals can now be found in the care plan section) Progress towards  PT goals: Progressing toward goals    Frequency    Min 2X/week      PT Plan Discharge plan needs to be updated    Co-evaluation              AM-PAC PT "6 Clicks" Mobility   Outcome Measure  Help needed turning from your back to your side while in a flat bed without using bedrails?: A Little Help needed moving from lying on your back to sitting on the side of a flat bed  without using bedrails?: A Little Help needed moving to and from a bed to a chair (including a wheelchair)?: A Little Help needed standing up from a chair using your arms (e.g., wheelchair or bedside chair)?: A Little Help needed to walk in hospital room?: A Little Help needed climbing 3-5 steps with a railing? : Total 6 Click Score: 16    End of Session Equipment Utilized During Treatment: Gait belt Activity Tolerance: Patient tolerated treatment well;Patient limited by fatigue Patient left: in bed;with call bell/phone within reach;with bed alarm set;with family/visitor present Nurse Communication: Mobility status PT Visit Diagnosis: Unsteadiness on feet (R26.81);Muscle weakness (generalized) (M62.81)     Time: 2449-7530 PT Time Calculation (min) (ACUTE ONLY): 23 min  Charges:  $Gait Training: 8-22 mins $Therapeutic Activity: 8-22 mins                   Ramond Dial 08/05/2018, 1:36 PM  Mee Hives, PT MS Acute Rehab Dept. Number: [place] and [place]

## 2018-08-05 NOTE — Clinical Social Work Note (Signed)
Clinical Social Work Assessment  Patient Details  Name: Christian Bailey MRN: [id number] Date of Birth: June 23, 1941  Date of referral:  08/05/18               Reason for consult:  Facility Placement, Family Concerns                Permission sought to share information with:  Facility Sport and exercise psychologist, Family Supports Permission granted to share information::  Yes, Verbal Permission Granted  Name::     [place]::  SNFs  Relationship::  spouse   Contact Information:  [phone]  Housing/Transportation Living arrangements for the past 2 months:  Single Family Home Source of Information:  Adult Children Patient Interpreter Needed:  None Criminal Activity/Legal Involvement Pertinent to Current Situation/Hospitalization:  No - Comment as needed Significant Relationships:  Adult Children Lives with:  Spouse Do you feel safe going back to the place where you live?  No Need for family participation in patient care:  Yes (Comment)  Care giving concerns:  CSW spoke with patient's son, Christian Bailey regarding possible SNF placement at time of discharge. Patient's son expressed his father is not stable during walking and believes he would benefits from short term rehab.     Social Worker assessment / plan:  CSW spoke with patient's son concerning possibility of rehab at [facility] before returning home. Patient was previous admitted and discharged home on 08/02/2018. Now, the family believes it is in the best interest of the patient to receive Short term rehab before returning home. CSW will continue to follow patient and assist with discharge planning needs.   Employment status:  Retired Forensic scientist:  Medicare PT Recommendations:  Home with [place] / Referral to community resources:  [place]  Patient/Family's Response to care:  Patient's family recognizes need for rehab before returning home and is agreeable to a SNF placement. Patient  reported preference for [facility].    Patient/Family's Understanding of and Emotional Response to Diagnosis, Current Treatment, and Prognosis:  Patient's family is realistic regarding therapy needs and expressed being hopeful for SNF placement. Patient's family expressed understanding of CSW role and discharge process as well as medical condition. No questions/concerns about plan or treatment at this time.  Emotional Assessment Appearance:  Appears stated age Attitude/Demeanor/Rapport:  Unable to Assess Affect (typically observed):  Unable to Assess Orientation:  Oriented to Self, Oriented to Place, Oriented to  Time, Oriented to Situation Alcohol / Substance use:  Not Applicable Psych involvement (Current and /or in the community):  No (Comment)  Discharge Needs  Concerns to be addressed:  Care Coordination Readmission within the last 30 days:  Yes Current discharge risk:  Dependent with Mobility Barriers to Discharge:  Continued Medical Work up   [employer], [place] 08/05/2018, 12:17 PM

## 2018-08-05 NOTE — Plan of Care (Signed)
  Problem: Education: Goal: Knowledge of General Education information will improve Description Including pain rating scale, medication(s)/side effects and non-pharmacologic comfort measures Outcome: Progressing   Problem: Health Behavior/Discharge Planning: Goal: Ability to manage health-related needs will improve Outcome: Progressing   Problem: Clinical Measurements: Goal: Ability to maintain clinical measurements within normal limits will improve Outcome: Progressing Goal: Will remain free from infection Outcome: Progressing Goal: Diagnostic test results will improve Outcome: Progressing Goal: Respiratory complications will improve Outcome: Progressing Goal: Cardiovascular complication will be avoided Outcome: Progressing   Problem: Clinical Measurements: Goal: Ability to maintain clinical measurements within normal limits will improve Outcome: Progressing Goal: Will remain free from infection Outcome: Progressing Goal: Diagnostic test results will improve Outcome: Progressing Goal: Respiratory complications will improve Outcome: Progressing Goal: Cardiovascular complication will be avoided Outcome: Progressing   Problem: Nutrition: Goal: Adequate nutrition will be maintained Outcome: Progressing   Problem: Activity: Goal: Risk for activity intolerance will decrease Outcome: Progressing   Problem: Elimination: Goal: Will not experience complications related to bowel motility Outcome: Progressing Goal: Will not experience complications related to urinary retention Outcome: Progressing

## 2018-08-05 NOTE — Progress Notes (Signed)
Rehab Admissions Coordinator Note:  Patient was screened by Retta Diones for appropriateness for an Inpatient Acute Rehab Consult.  At this time, we are recommending SNF placement or HH therapies.  He is doing too well to meet criteria for an acute inpatient rehab admission.  He is already walking min guard assist 150 feet.  Call me for questions.  Jodell Cipro M 08/05/2018, 3:30 PM  I can be reached at [phone].

## 2018-08-05 NOTE — Progress Notes (Signed)
Occupational Therapy Treatment Patient Details Name: Christian Bailey MRN: [id number] DOB: Mar 24, 1941 Today's Date: 08/05/2018    History of present illness 78 yo male with onset of N&V was admitted from dehydration and low Na+.  Has history of revascularization procedures with most recent revision with R aortofemoral bypass and revised R fem pop bypass.  PMHx:  OA, GI bleed, PAD, R BBB, tachycardia    OT comments  Pt oriented, but with tangential statements and difficulty sequencing. He is a high fall risk. Pt able to perform bed mobility with assist for lines. Min assist to don front opening gown. Transferred to chair and performed grooming with set up. Pt is agreeable to continued rehab in SNF. Lights left on and window shade open.  Follow Up Recommendations  SNF;Supervision/Assistance - 24 hour    Equipment Recommendations       Recommendations for Other Services      Precautions / Restrictions Precautions Precautions: Fall Precaution Comments: chole drain       Mobility Bed Mobility Overal bed mobility: Needs Assistance Bed Mobility: Supine to Sit     Supine to sit: Supervision     General bed mobility comments: use of rail  Transfers Overall transfer level: Needs assistance Equipment used: Rolling walker (2 wheeled) Transfers: Sit to/from [facility] Sit to Stand: Min assist Stand pivot transfers: Min assist       General transfer comment: steadying assist, cues for hand placement    Balance Overall balance assessment: Needs assistance   Sitting balance-Leahy Scale: Fair       Standing balance-Leahy Scale: Poor                             ADL either performed or assessed with clinical judgement   ADL Overall ADL's : Needs assistance/impaired Eating/Feeding: Independent;Bed level   Grooming: Wash/dry hands;Wash/dry face;Sitting;Set up           Upper Body Dressing : Minimal assistance;Sitting Upper Body Dressing Details  (indicate cue type and reason): front opening gown     Toilet Transfer: Minimal assistance;Stand-pivot;BSC;RW   Toileting- Clothing Manipulation and Hygiene: Sit to/from stand;Minimal assistance               Vision       Perception     Praxis      Cognition Arousal/Alertness: Awake/alert Behavior During Therapy: WFL for tasks assessed/performed Overall Cognitive Status: Impaired/Different from baseline Area of Impairment: Safety/judgement;Problem solving;Following commands;Memory;Attention                   Current Attention Level: Selective Memory: Decreased short-term memory Following Commands: Follows one step commands consistently;Follows one step commands with increased time Safety/Judgement: Decreased awareness of safety;Decreased awareness of deficits   Problem Solving: Slow processing;Difficulty sequencing;Requires verbal cues;Requires tactile cues General Comments: oriented        Exercises     Shoulder Instructions       General Comments      Pertinent Vitals/ Pain       Pain Assessment: No/denies pain  Home Living                                          Prior Functioning/Environment              Frequency  Min 2X/week  Progress Toward Goals  OT Goals(current goals can now be found in the care plan section)  Progress towards OT goals: Progressing toward goals  Acute Rehab OT Goals Patient Stated Goal: my wife wants me to get some rehab first OT Goal Formulation: With patient Time For Goal Achievement: 08/18/18 Potential to Achieve Goals: Good  Plan Discharge plan remains appropriate    Co-evaluation                 AM-PAC OT "6 Clicks" Daily Activity     Outcome Measure   Help from another person eating meals?: None Help from another person taking care of personal grooming?: A Little Help from another person toileting, which includes using toliet, bedpan, or urinal?: A Little Help  from another person bathing (including washing, rinsing, drying)?: A Little Help from another person to put on and taking off regular upper body clothing?: A Little Help from another person to put on and taking off regular lower body clothing?: A Lot 6 Click Score: 18    End of Session Equipment Utilized During Treatment: Gait belt;Rolling walker  OT Visit Diagnosis: Unsteadiness on feet (R26.81);Other abnormalities of gait and mobility (R26.89);Pain   Activity Tolerance Patient tolerated treatment well   Patient Left in chair;with call bell/phone within reach;with chair alarm set   Nurse Communication          Time: [phone] OT Time Calculation (min): 27 min  Charges: OT General Charges $OT Visit: 1 Visit OT Treatments $Self Care/Home Management : 23-37 mins  Nestor Lewandowsky, OTR/L Acute Rehabilitation Services Pager: [phone] Office: [phone]   Malka So 08/05/2018, 10:39 AM

## 2018-08-05 NOTE — Progress Notes (Addendum)
  Progress Note    08/05/2018 7:58 AM  Subjective:  Patient feeling "much better than yesterday."  Patient finished breakfast tray this morning.  No N/V.   Vitals:   08/04/18 2307 08/05/18 0326  BP: 123/61 131/61  Pulse: 88   Resp: 14 15  Temp: 98.3 F (36.8 C) 97.7 F (36.5 C)  SpO2: 100% 100%   Physical Exam: Lungs:  Non labored Incisions:  abd incision unremarkable; groins also c/d/i Extremities:  Palpable DP pulses R>L Abdomen:  Soft, nontender Neurologic: A&O  CBC    Component Value Date/Time   WBC 7.7 08/04/2018 0256   RBC 2.78 (L) 08/04/2018 0256   HGB 7.9 (L) 08/04/2018 0256   HCT 25.5 (L) 08/04/2018 0256   PLT 374 08/04/2018 0256   MCV 91.7 08/04/2018 0256   MCH 28.4 08/04/2018 0256   MCHC 31.0 08/04/2018 0256   RDW 15.5 08/04/2018 0256   LYMPHSABS 0.8 08/03/2018 1220   MONOABS 0.7 08/03/2018 1220   EOSABS 0.1 08/03/2018 1220   BASOSABS 0.1 08/03/2018 1220    BMET    Component Value Date/Time   NA 139 08/03/2018 1220   K 4.4 08/03/2018 1220   CL 103 08/03/2018 1220   CO2 25 08/03/2018 1220   GLUCOSE 173 (H) 08/03/2018 1220   BUN 22 08/03/2018 1220   CREATININE 1.55 (H) 08/04/2018 0256   CREATININE 1.33 (H) 04/10/2016 0851   CALCIUM 8.7 (L) 08/03/2018 1220   GFRNONAA 43 (L) 08/04/2018 0256   GFRAA 49 (L) 08/04/2018 0256    INR    Component Value Date/Time   INR 1.05 08/03/2018 1848     Intake/Output Summary (Last 24 hours) at 08/05/2018 0758 Last data filed at 08/05/2018 0328 Gross per 24 hour  Intake 100 ml  Output 1500 ml  Net -1400 ml     Assessment/Plan:  78 y.o. male is s/p ABF and revision of R fem-pop, readmitted for failure to thrive  Perfusing BLE well with palpable DP pulses Drain study shows functional drain Pt tolerating diet Patient and wife discussing SNF vs HH   Dagoberto Ligas, PA-C [facility] [phone] 08/05/2018 7:58 AM   Agree with the above.  COntinues to look better.  Maybe home to  SNF or home soon  [facility]

## 2018-08-06 LAB — GLUCOSE, CAPILLARY
Glucose-Capillary: 88 mg/dL (ref 70–99)
Glucose-Capillary: 116 mg/dL — ABNORMAL HIGH (ref 70–99)
Glucose-Capillary: 83 mg/dL (ref 70–99)
Glucose-Capillary: 136 mg/dL — ABNORMAL HIGH (ref 70–99)

## 2018-08-06 NOTE — Progress Notes (Addendum)
  Progress Note    08/06/2018 8:39 AM  Subjective:  Denies abd pain, N/V.  Still somewhat confused this morning.   Vitals:   08/06/18 0305 08/06/18 0806  BP: 129/67 140/66  Pulse: 94 (!) 102  Resp: 17 18  Temp: 97.8 F (36.6 C) 97.7 F (36.5 C)  SpO2: 100% 100%   Physical Exam: Lungs:  Non labored Incisions:  Abd incision and groin incisions healing well Extremities:  Palpable DP pulses R>L Abdomen:  Soft, NT, ND Neurologic: confused  CBC    Component Value Date/Time   WBC 7.1 08/05/2018 0802   RBC 3.12 (L) 08/05/2018 0802   HGB 8.9 (L) 08/05/2018 0802   HCT 27.9 (L) 08/05/2018 0802   PLT 397 08/05/2018 0802   MCV 89.4 08/05/2018 0802   MCH 28.5 08/05/2018 0802   MCHC 31.9 08/05/2018 0802   RDW 15.3 08/05/2018 0802   LYMPHSABS 0.8 08/03/2018 1220   MONOABS 0.7 08/03/2018 1220   EOSABS 0.1 08/03/2018 1220   BASOSABS 0.1 08/03/2018 1220    BMET    Component Value Date/Time   NA 139 08/03/2018 1220   K 4.4 08/03/2018 1220   CL 103 08/03/2018 1220   CO2 25 08/03/2018 1220   GLUCOSE 173 (H) 08/03/2018 1220   BUN 22 08/03/2018 1220   CREATININE 1.55 (H) 08/04/2018 0256   CREATININE 1.33 (H) 04/10/2016 0851   CALCIUM 8.7 (L) 08/03/2018 1220   GFRNONAA 43 (L) 08/04/2018 0256   GFRAA 49 (L) 08/04/2018 0256    INR    Component Value Date/Time   INR 1.05 08/03/2018 1848     Intake/Output Summary (Last 24 hours) at 08/06/2018 3664 Last data filed at 08/06/2018 0827 Gross per 24 hour  Intake 312 ml  Output 1780 ml  Net -1468 ml     Assessment/Plan:  78 y.o. male is s/p ABF and revision of R fem-pop, readmitted for failure to thrive  Perfusing BLE well Encouraged continued work with therapy teams Tolerating regular diet without nausea CSW working on SNF placement D/c to SNF pending approval and bed availability    Dagoberto Ligas, PA-C [facility] [phone] 08/06/2018 8:39 AM  I have seen and evaluated the patient. I agree  with the PA note as documented above. Tolerating regular diet.  Lower extremities well perfused.  Confused overnight - but oriented this am.  Plan for SNF.  Marty Heck, MD [facility] [place] Office: [phone] Pager: [phone]

## 2018-08-06 NOTE — Progress Notes (Signed)
Patient has had hallucinations throughout the night, I have re-oriented the patient each time I have entered the room. The patient became very anxious and aggressive as myself and charge nurse tried to re-position the patient in his bed. The patient became very angry and started yelling. Notified the on call doctor requesting medication to help the patient become less agitated and to help him sleep, he did not slept during the night. MD will assess the situation during morning rounds.

## 2018-08-07 LAB — GLUCOSE, CAPILLARY
Glucose-Capillary: 81 mg/dL (ref 70–99)
Glucose-Capillary: 69 mg/dL — ABNORMAL LOW (ref 70–99)
Glucose-Capillary: 132 mg/dL — ABNORMAL HIGH (ref 70–99)
Glucose-Capillary: 153 mg/dL — ABNORMAL HIGH (ref 70–99)

## 2018-08-07 NOTE — Progress Notes (Addendum)
  Progress Note    08/07/2018 7:51 AM  Subjective:  Confused/delirium again this morning   Vitals:   08/07/18 0346 08/07/18 0450  BP: (!) 152/62   Pulse: 95   Resp: 16 (!) 21  Temp: 97.6 F (36.4 C)   SpO2: 99%    Physical Exam: Lungs:  Non labored Incisions:  Abd/groin incisions unremarkable Extremities:  DP pulses bilaterally R>L Abdomen:  Soft, NT, ND Neurologic: confused  CBC    Component Value Date/Time   WBC 7.1 08/05/2018 0802   RBC 3.12 (L) 08/05/2018 0802   HGB 8.9 (L) 08/05/2018 0802   HCT 27.9 (L) 08/05/2018 0802   PLT 397 08/05/2018 0802   MCV 89.4 08/05/2018 0802   MCH 28.5 08/05/2018 0802   MCHC 31.9 08/05/2018 0802   RDW 15.3 08/05/2018 0802   LYMPHSABS 0.8 08/03/2018 1220   MONOABS 0.7 08/03/2018 1220   EOSABS 0.1 08/03/2018 1220   BASOSABS 0.1 08/03/2018 1220    BMET    Component Value Date/Time   NA 139 08/03/2018 1220   K 4.4 08/03/2018 1220   CL 103 08/03/2018 1220   CO2 25 08/03/2018 1220   GLUCOSE 173 (H) 08/03/2018 1220   BUN 22 08/03/2018 1220   CREATININE 1.55 (H) 08/04/2018 0256   CREATININE 1.33 (H) 04/10/2016 0851   CALCIUM 8.7 (L) 08/03/2018 1220   GFRNONAA 43 (L) 08/04/2018 0256   GFRAA 49 (L) 08/04/2018 0256    INR    Component Value Date/Time   INR 1.05 08/03/2018 1848     Intake/Output Summary (Last 24 hours) at 08/07/2018 0751 Last data filed at 08/07/2018 0741 Gross per 24 hour  Intake 240 ml  Output 1850 ml  Net -1610 ml     Assessment/Plan:  78 y.o. male is s/p ABF and revision of R fem-pop, readmitted for failure to thrive  Palpable DP pulses Confused overnight and again this morning Continues to tolerate regular diet Awaiting SNF bed   Dagoberto Ligas, PA-C [facility] [phone] 08/07/2018 7:51 AM  I have seen and evaluated the patient. I agree with the PA note as documented above. Palpable pedal pulses.  Incisions c/d/i.  Tolerating PO. Continues to have confusion at night.   Plan for SNF bed.  Marty Heck, MD [facility] [place] Office: [phone] Pager: [phone]

## 2018-08-08 ENCOUNTER — Telehealth: Payer: Self-pay | Admitting: Surgery

## 2018-08-08 DIAGNOSIS — E119 Type 2 diabetes mellitus without complications: Secondary | ICD-10-CM | POA: Diagnosis not present

## 2018-08-08 DIAGNOSIS — R531 Weakness: Secondary | ICD-10-CM | POA: Diagnosis not present

## 2018-08-08 DIAGNOSIS — I452 Bifascicular block: Secondary | ICD-10-CM | POA: Diagnosis not present

## 2018-08-08 DIAGNOSIS — I10 Essential (primary) hypertension: Secondary | ICD-10-CM | POA: Diagnosis not present

## 2018-08-08 DIAGNOSIS — I700 Atherosclerosis of aorta: Secondary | ICD-10-CM | POA: Diagnosis not present

## 2018-08-08 DIAGNOSIS — K219 Gastro-esophageal reflux disease without esophagitis: Secondary | ICD-10-CM | POA: Diagnosis not present

## 2018-08-08 DIAGNOSIS — I739 Peripheral vascular disease, unspecified: Secondary | ICD-10-CM | POA: Diagnosis not present

## 2018-08-08 DIAGNOSIS — E785 Hyperlipidemia, unspecified: Secondary | ICD-10-CM | POA: Diagnosis not present

## 2018-08-08 DIAGNOSIS — R2689 Other abnormalities of gait and mobility: Secondary | ICD-10-CM | POA: Diagnosis not present

## 2018-08-08 DIAGNOSIS — I959 Hypotension, unspecified: Secondary | ICD-10-CM | POA: Diagnosis not present

## 2018-08-08 DIAGNOSIS — M1991 Primary osteoarthritis, unspecified site: Secondary | ICD-10-CM | POA: Diagnosis not present

## 2018-08-08 DIAGNOSIS — Z743 Need for continuous supervision: Secondary | ICD-10-CM | POA: Diagnosis not present

## 2018-08-08 DIAGNOSIS — K5730 Diverticulosis of large intestine without perforation or abscess without bleeding: Secondary | ICD-10-CM | POA: Diagnosis not present

## 2018-08-08 DIAGNOSIS — M6281 Muscle weakness (generalized): Secondary | ICD-10-CM | POA: Diagnosis not present

## 2018-08-08 DIAGNOSIS — R279 Unspecified lack of coordination: Secondary | ICD-10-CM | POA: Diagnosis not present

## 2018-08-08 DIAGNOSIS — D649 Anemia, unspecified: Secondary | ICD-10-CM | POA: Diagnosis not present

## 2018-08-08 LAB — GLUCOSE, CAPILLARY
GLUCOSE-CAPILLARY: 129 mg/dL — AB (ref 70–99)
Glucose-Capillary: 95 mg/dL (ref 70–99)

## 2018-08-08 MED ORDER — TRAMADOL HCL 50 MG PO TABS: 50.0000 mg | ORAL_TABLET | Freq: Four times a day (QID) | ORAL | 0 refills | Status: DC | PRN

## 2018-08-08 MED ORDER — SCOPOLAMINE 1 MG/3DAYS TD PT72: 1.0000 | MEDICATED_PATCH | TRANSDERMAL | 0 refills | Status: AC

## 2018-08-08 NOTE — Telephone Encounter (Signed)
-----   Message from Ulyses Amor, [place] sent at 08/08/2018  7:24 AM EST -----  S/P aortobifemoral bypass.  He was readmitted change up coming f/u appt. For 4 weeks from now.  Thx MC  with [name]

## 2018-08-08 NOTE — Progress Notes (Signed)
Patient will DC to: [facility] Date: 08/08/2018 Family Notified: [name], spouse Transport By: [facility] @ 3pm  RN, patient, and facility notified of DC. Discharge Summary sent to facility. RN given number for [phone], room 101-A. DC packet on chart. Ambulance transport requested for patient.   Clinical Social Worker signing off.  Thurmond Butts, [place] Social Worker [phone]

## 2018-08-08 NOTE — Consult Note (Signed)
   [facility] Inpatient Consult   08/08/2018  Christian Bailey June 09, 1941 [id number]   Received notification from [place] Management of patient's EMMI post discharge call alert.   Spoke with inpatient RNCM to discuss potential [facility] Care Management needs. Writer informed that [name] is slated for discharge to SNF. No identifiable [facility] Care Management needs at this time.   Marthenia Rolling, MSN-Ed, RN,BSN [facility] Liaison [phone]

## 2018-08-08 NOTE — Progress Notes (Signed)
Report given to Marliss Coots, Therapist, sports at [facility]. All questions were answered. Pt awaiting [facility] arrival for transport.   Ara Kussmaul BSN, RN

## 2018-08-08 NOTE — Discharge Summary (Signed)
[facility] Discharge Summary   Patient ID:  Christian Bailey MRN: [id number] DOB/AGE: Jun 16, 1941 78 y.o.  Admit date: 08/03/2018 Discharge date: 08/08/2018 Date of Surgery: 07/06/2018 Surgeon:Dr. [name] Admission Diagnosis: Fever [R50.9] Diverticulosis of large intestine without hemorrhage [K57.30] Anemia, unspecified type [D64.9]  Discharge Diagnoses:  Fever [R50.9] Diverticulosis of large intestine without hemorrhage [K57.30] Anemia, unspecified type [D64.9]  Secondary Diagnoses: Past Medical History:  Diagnosis Date  . Anemia   . Complication of anesthesia   . Diabetes mellitus ([place])    TYPE 2  . GERD (gastroesophageal reflux disease)   . History of blood transfusion    GI bleed  . History of colon polyps   . History of hiatal hernia   . Hyperlipemia   . Hypertension   . Osteoarthritis   . PAD (peripheral artery disease) ([facility])    a. stenting of his left common iliac artery >20 years ago. b. h/o LEIA stent and 2 stents to R SFA in 2011. c. 04/2014:  s/p PTA of right SFA for in-stent restenosis, occluded left SFA  . PONV (postoperative nausea and vomiting)    no porblem with the last 3 surgeries  . RBBB (right bundle branch block with left anterior fascicular block)    NUCLEAR STRESS TEST, 08/18/2010 - no significant wall motion abnoramlities noted, post-stress EF 69%, normal myocardial perfusion study  . Sinus tachycardia    a. Noted during admission 04/2014 but upon review seems to be frequent finding for patient.  . Stenosis of carotid artery    a. 50% right carotid stenosis by angiogram 04/2014.      Discharged Condition: stable  HPI: History of Present Illness: This is a 78 y.o. male who underwent aortobifemoral bypass and revision of right femoral to popliteal bypass graft using 31mm Gore-tex interposition graft on 07/06/2018 by [name].  His post op course was complicated by acalculous cholecystitis and colonic ileus and required a  cholecystostomy tube with bilious drainage.  He was discharged Saturday.  He states that on Sunday he was doing well and walked a "300 paces".    Around Monday, he started feeling worse and become progressively nauseated and had some dry heaving.  He states that he has drank and eaten very little.  All smells are nauseating and nothing tastes good.  He has had a bowel movement in the past couple of days.  He had a dulcolax yesterday.  He states that the [facility] took his temperature and it was around 100 but when they retook his temp, it was 97.  He has had some chills.  He denies any pain in his legs or feet.      His wife states that she has been flushing his cholecystostomy tube with saline but has not had to drain it since Sunday.  Admit for failure to thrive   Hospital Course:  Christian Bailey is a 78 y.o. male is S/P  Confused.   Tolerating regular diet, ambulating with assistants in the halls.  No N/V/D Denies abdominal pain. Continues to improve.  Pending discharge to SNF for continued PT/OT and Billi bag care.        Consults:  Treatment Team:  Serafina Mitchell, MD  Significant Diagnostic Studies: CBC Lab Results  Component Value Date   WBC 7.1 08/05/2018   HGB 8.9 (L) 08/05/2018   HCT 27.9 (L) 08/05/2018   MCV 89.4 08/05/2018   PLT 397 08/05/2018    BMET    Component Value Date/Time  NA 139 08/03/2018 1220   K 4.4 08/03/2018 1220   CL 103 08/03/2018 1220   CO2 25 08/03/2018 1220   GLUCOSE 173 (H) 08/03/2018 1220   BUN 22 08/03/2018 1220   CREATININE 1.55 (H) 08/04/2018 0256   CREATININE 1.33 (H) 04/10/2016 0851   CALCIUM 8.7 (L) 08/03/2018 1220   GFRNONAA 43 (L) 08/04/2018 0256   GFRAA 49 (L) 08/04/2018 0256   COAG Lab Results  Component Value Date   INR 1.05 08/03/2018   INR 1.25 07/06/2018   INR 1.04 07/01/2018     Disposition:  Discharge to :Skilled nursing facility Discharge Instructions    Call MD for:  redness, tenderness, or signs of infection  (pain, swelling, bleeding, redness, odor or green/yellow discharge around incision site)   Complete by:  As directed    Call MD for:  severe or increased pain, loss or decreased feeling  in affected limb(s)   Complete by:  As directed    Call MD for:  temperature >100.5   Complete by:  As directed    Resume previous diet   Complete by:  As directed      Allergies as of 08/08/2018      Reactions   Asa [aspirin] Other (See Comments)   GI bleeding   Gadolinium Derivatives Itching, Swelling, Other (See Comments)   Pt complained of face flushing/hottness and throat tightness/scratchiness immediately after the injections.  Within 4 minutes, all symptoms were gone and the study was completed.  No further complications or signs of allergy were exhibited after completion of study.    Oxycodone-acetaminophen Other (See Comments)   Dizziness and feeling of being uncomfortable       Medication List    TAKE these medications   amoxicillin-clavulanate 875-125 MG tablet Commonly known as:  AUGMENTIN Take 1 tablet by mouth every 12 (twelve) hours.   clopidogrel 75 MG tablet Commonly known as:  PLAVIX Take 1 tablet (75 mg total) by mouth daily.   COLACE CLEAR 50 MG capsule Generic drug:  docusate sodium Take 50 mg by mouth 2 (two) times daily as needed for mild constipation.   COQ10 PO Take 1 capsule by mouth every evening.   Fish Oil 1200 MG Caps Take 1,200 mg by mouth every evening.   glucosamine-chondroitin 500-400 MG tablet Take 1 tablet by mouth every evening.   lisinopril 5 MG tablet Commonly known as:  PRINIVIL,ZESTRIL Take 5 mg by mouth 2 (two) times daily.   metFORMIN 1000 MG tablet Commonly known as:  GLUCOPHAGE Take 1 tablet (1,000 mg total) by mouth 2 (two) times daily with a meal.   metoprolol tartrate 25 MG tablet Commonly known as:  LOPRESSOR Take 1 tablet (25 mg total) by mouth 2 (two) times daily.   ondansetron 4 MG disintegrating tablet Commonly known as:   ZOFRAN ODT Take 1 tablet (4 mg total) by mouth every 8 (eight) hours as needed for nausea or vomiting.   ondansetron 4 MG tablet Commonly known as:  ZOFRAN Take 1 tablet (4 mg total) by mouth daily as needed for nausea or vomiting.   pantoprazole 40 MG tablet Commonly known as:  PROTONIX Take 40 mg by mouth twice daily What changed:    how much to take  how to take this  when to take this  additional instructions   rosuvastatin 20 MG tablet Commonly known as:  CRESTOR Take 20 mg by mouth every evening.   scopolamine 1 MG/3DAYS Commonly known as:  TRANSDERM-SCOP Place  1 patch (1.5 mg total) onto the skin every 3 (three) days for 10 doses.   traMADol 50 MG tablet Commonly known as:  ULTRAM Take 1 tablet (50 mg total) by mouth every 6 (six) hours as needed for moderate pain. What changed:  reasons to take this      Verbal and written Discharge instructions given to the patient. Wound care per Discharge AVS Follow-up Information    Serafina Mitchell, MD Follow up in 4 week(s).   Specialties:  Vascular Surgery, Cardiology Contact information: [address] [place] [address] [phone]           Signed: [name] 08/08/2018, 7:33 AM

## 2018-08-08 NOTE — Progress Notes (Signed)
Pt picked up by [facility] for transport to [facility]. Pt's wife, Blain Pais left with all of pt's belongings. Vitals stable at time of transport.

## 2018-08-08 NOTE — Telephone Encounter (Signed)
resch appt lvm mld ltr 09/12/2018 1230pm p/o MD

## 2018-08-08 NOTE — Progress Notes (Addendum)
[facility] [place] and not sure of when he is going to SNF.   Objective (!) 146/88 (!) 104 97.7 F (36.5 C) (Oral) 12 98%  Intake/Output Summary (Last 24 hours) at 08/08/2018 0715 Last data filed at 08/08/2018 9191 Gross per 24 hour  Intake 480 ml  Output 2475 ml  Net -1995 ml    Palpable DP pulses B Groin healing well Abdomin soft NTTP Heart tachycardia 109 Lungs non labored breathing   Assessment/Planning: S/P Aortobifemoral Cholecystitis    Tolerating PO's Confused at night per nursing staff, ambulating in the halls Pending SNF placement Disposition stable for discharge to SNF  [name] 08/08/2018 7:15 AM --  Laboratory Lab Results: Recent Labs    08/05/18 0802  WBC 7.1  HGB 8.9*  HCT 27.9*  PLT 397   BMET No results for input(s): NA, K, CL, CO2, GLUCOSE, BUN, CREATININE, CALCIUM in the last 72 hours.  COAG Lab Results  Component Value Date   INR 1.05 08/03/2018   INR 1.25 07/06/2018   INR 1.04 07/01/2018   No results found for: PTT   Awaiting dispo to SNF  [facility]

## 2018-08-08 NOTE — Clinical Social Work Placement (Signed)
   CLINICAL SOCIAL WORK PLACEMENT  NOTE  Date:  08/08/2018  Patient Details  Name: Christian Bailey MRN: [id number] Date of Birth: 1940/10/17  Clinical Social Work is seeking post-discharge placement for this patient at the [place] level of care (*CSW will initial, date and re-position this form in  chart as items are completed):  Yes   Patient/family provided with [place] Work Department's list of facilities offering this level of care within the geographic area requested by the patient (or if unable, by the patient's family).  Yes   Patient/family informed of their freedom to choose among providers that offer the needed level of care, that participate in Medicare, Medicaid or managed care program needed by the patient, have an available bed and are willing to accept the patient.  Yes   Patient/family informed of 's ownership interest in [facility] and [facility], as well as of the fact that they are under no obligation to receive care at these facilities.  PASRR submitted to EDS on       PASRR number received on 08/05/18     Existing PASRR number confirmed on       FL2 transmitted to all facilities in geographic area requested by pt/family on       FL2 transmitted to all facilities within larger geographic area on       Patient informed that his/her managed care company has contracts with or will negotiate with certain facilities, including the following:        Yes   Patient/family informed of bed offers received.  Patient chooses bed at [facility]     Physician recommends and patient chooses bed at      Patient to be transferred to [facility] on 08/08/18.  Patient to be transferred to facility by [facility]     Patient family notified on 08/08/18 of transfer.  Name of family member notified:  Blain Pais, spouse     PHYSICIAN       Additional Comment:     _______________________________________________ Vinie Sill, [place] 08/08/2018, 1:47 PM

## 2018-08-08 NOTE — Care Management Note (Signed)
Case Management Note Marvetta Gibbons RN, BSN Transitions of Care Unit 4E- RN Case Manager [phone]  Patient Details  Name: Christian Bailey MRN: [id number] Date of Birth: 19-Mar-1941  Subjective/Objective:   Pt admitted after recent discharge on 08/08/18 with failure to thrive at home, fevers and nausea.                 Action/Plan: PTA pt lived at home with wife, was active with [facility] for HHRN/PT, on PT eval this admit recommendation for SNF, CSW has been consulted for placement needs. CIR screened pt however felt pt appropriate for SNF level. CM will alert [facility] when SNF placement known so that they can f/u post facility.   Expected Discharge Date:  08/08/18               Expected Discharge Plan:  Skilled Nursing Facility  In-House Referral:  Clinical Social Work  Discharge planning Services  CM Consult  Post Acute Care Choice:  Home Health, Resumption of Svcs/PTA Provider Choice offered to:  Patient, Adult Children, Spouse  DME Arranged:    DME Agency:     HH Arranged:    [place] Agency:  [facility]  Status of Service:  Completed, signed off  If discussed at [place] of Stay Meetings, dates discussed:    Discharge Disposition: skilled facility   Additional Comments:  Dawayne Patricia, RN 08/08/2018, 10:29 AM

## 2018-08-15 ENCOUNTER — Encounter: Payer: Medicare Other | Admitting: Surgery

## 2018-08-16 DIAGNOSIS — D649 Anemia, unspecified: Secondary | ICD-10-CM | POA: Diagnosis not present

## 2018-08-16 DIAGNOSIS — E119 Type 2 diabetes mellitus without complications: Secondary | ICD-10-CM | POA: Diagnosis not present

## 2018-08-16 DIAGNOSIS — K5730 Diverticulosis of large intestine without perforation or abscess without bleeding: Secondary | ICD-10-CM | POA: Diagnosis not present

## 2018-08-16 DIAGNOSIS — M6281 Muscle weakness (generalized): Secondary | ICD-10-CM | POA: Diagnosis not present

## 2018-08-16 DIAGNOSIS — R2689 Other abnormalities of gait and mobility: Secondary | ICD-10-CM | POA: Diagnosis not present

## 2018-08-17 ENCOUNTER — Other Ambulatory Visit: Payer: Self-pay | Admitting: *Deleted

## 2018-08-17 NOTE — Patient Outreach (Signed)
[facility]) Care Management  08/17/2018  Christian Bailey [birth year] [id number]   Onsite visit to [facility].  Met with Marita [place], discharge planner. Patient discharged home with wife today.  Kristy set up home care services.  No [facility] care management needs identified.  Plan to sign off. Royetta Crochet. Laymond Purser, MSN, RN, [employer] , [place] [phone]) Business Cell  [phone]) Toll Free Office

## 2018-08-18 DIAGNOSIS — Z48812 Encounter for surgical aftercare following surgery on the circulatory system: Secondary | ICD-10-CM | POA: Diagnosis not present

## 2018-08-18 DIAGNOSIS — Z7984 Long term (current) use of oral hypoglycemic drugs: Secondary | ICD-10-CM | POA: Diagnosis not present

## 2018-08-18 DIAGNOSIS — K819 Cholecystitis, unspecified: Secondary | ICD-10-CM | POA: Diagnosis not present

## 2018-08-18 DIAGNOSIS — D649 Anemia, unspecified: Secondary | ICD-10-CM | POA: Diagnosis not present

## 2018-08-18 DIAGNOSIS — I739 Peripheral vascular disease, unspecified: Secondary | ICD-10-CM | POA: Diagnosis not present

## 2018-08-18 DIAGNOSIS — E1151 Type 2 diabetes mellitus with diabetic peripheral angiopathy without gangrene: Secondary | ICD-10-CM | POA: Diagnosis not present

## 2018-08-19 DIAGNOSIS — K819 Cholecystitis, unspecified: Secondary | ICD-10-CM | POA: Diagnosis not present

## 2018-08-19 DIAGNOSIS — Z48812 Encounter for surgical aftercare following surgery on the circulatory system: Secondary | ICD-10-CM | POA: Diagnosis not present

## 2018-08-19 DIAGNOSIS — I499 Cardiac arrhythmia, unspecified: Secondary | ICD-10-CM | POA: Diagnosis not present

## 2018-08-19 DIAGNOSIS — E7849 Other hyperlipidemia: Secondary | ICD-10-CM | POA: Diagnosis not present

## 2018-08-19 DIAGNOSIS — I10 Essential (primary) hypertension: Secondary | ICD-10-CM | POA: Diagnosis not present

## 2018-08-19 DIAGNOSIS — I739 Peripheral vascular disease, unspecified: Secondary | ICD-10-CM | POA: Diagnosis not present

## 2018-08-19 DIAGNOSIS — K567 Ileus, unspecified: Secondary | ICD-10-CM | POA: Diagnosis not present

## 2018-08-19 DIAGNOSIS — D62 Acute posthemorrhagic anemia: Secondary | ICD-10-CM | POA: Diagnosis not present

## 2018-08-19 DIAGNOSIS — D649 Anemia, unspecified: Secondary | ICD-10-CM | POA: Diagnosis not present

## 2018-08-19 DIAGNOSIS — Z6822 Body mass index (BMI) 22.0-22.9, adult: Secondary | ICD-10-CM | POA: Diagnosis not present

## 2018-08-19 DIAGNOSIS — K5730 Diverticulosis of large intestine without perforation or abscess without bleeding: Secondary | ICD-10-CM | POA: Diagnosis not present

## 2018-08-19 DIAGNOSIS — E1151 Type 2 diabetes mellitus with diabetic peripheral angiopathy without gangrene: Secondary | ICD-10-CM | POA: Diagnosis not present

## 2018-08-19 DIAGNOSIS — Z7984 Long term (current) use of oral hypoglycemic drugs: Secondary | ICD-10-CM | POA: Diagnosis not present

## 2018-08-22 DIAGNOSIS — E1151 Type 2 diabetes mellitus with diabetic peripheral angiopathy without gangrene: Secondary | ICD-10-CM | POA: Diagnosis not present

## 2018-08-22 DIAGNOSIS — I739 Peripheral vascular disease, unspecified: Secondary | ICD-10-CM | POA: Diagnosis not present

## 2018-08-22 DIAGNOSIS — K819 Cholecystitis, unspecified: Secondary | ICD-10-CM | POA: Diagnosis not present

## 2018-08-22 DIAGNOSIS — Z48812 Encounter for surgical aftercare following surgery on the circulatory system: Secondary | ICD-10-CM | POA: Diagnosis not present

## 2018-08-22 DIAGNOSIS — D649 Anemia, unspecified: Secondary | ICD-10-CM | POA: Diagnosis not present

## 2018-08-22 DIAGNOSIS — Z7984 Long term (current) use of oral hypoglycemic drugs: Secondary | ICD-10-CM | POA: Diagnosis not present

## 2018-08-23 DIAGNOSIS — Z434 Encounter for attention to other artificial openings of digestive tract: Secondary | ICD-10-CM | POA: Diagnosis not present

## 2018-08-23 DIAGNOSIS — K819 Cholecystitis, unspecified: Secondary | ICD-10-CM | POA: Diagnosis not present

## 2018-08-24 DIAGNOSIS — Z48812 Encounter for surgical aftercare following surgery on the circulatory system: Secondary | ICD-10-CM | POA: Diagnosis not present

## 2018-08-24 DIAGNOSIS — K819 Cholecystitis, unspecified: Secondary | ICD-10-CM | POA: Diagnosis not present

## 2018-08-24 DIAGNOSIS — Z7984 Long term (current) use of oral hypoglycemic drugs: Secondary | ICD-10-CM | POA: Diagnosis not present

## 2018-08-24 DIAGNOSIS — E1151 Type 2 diabetes mellitus with diabetic peripheral angiopathy without gangrene: Secondary | ICD-10-CM | POA: Diagnosis not present

## 2018-08-24 DIAGNOSIS — D649 Anemia, unspecified: Secondary | ICD-10-CM | POA: Diagnosis not present

## 2018-08-24 DIAGNOSIS — I739 Peripheral vascular disease, unspecified: Secondary | ICD-10-CM | POA: Diagnosis not present

## 2018-08-25 DIAGNOSIS — I739 Peripheral vascular disease, unspecified: Secondary | ICD-10-CM | POA: Diagnosis not present

## 2018-08-25 DIAGNOSIS — Z7984 Long term (current) use of oral hypoglycemic drugs: Secondary | ICD-10-CM | POA: Diagnosis not present

## 2018-08-25 DIAGNOSIS — Z48812 Encounter for surgical aftercare following surgery on the circulatory system: Secondary | ICD-10-CM | POA: Diagnosis not present

## 2018-08-25 DIAGNOSIS — K819 Cholecystitis, unspecified: Secondary | ICD-10-CM | POA: Diagnosis not present

## 2018-08-25 DIAGNOSIS — D649 Anemia, unspecified: Secondary | ICD-10-CM | POA: Diagnosis not present

## 2018-08-25 DIAGNOSIS — E1151 Type 2 diabetes mellitus with diabetic peripheral angiopathy without gangrene: Secondary | ICD-10-CM | POA: Diagnosis not present

## 2018-08-26 DIAGNOSIS — E1151 Type 2 diabetes mellitus with diabetic peripheral angiopathy without gangrene: Secondary | ICD-10-CM | POA: Diagnosis not present

## 2018-08-26 DIAGNOSIS — Z48812 Encounter for surgical aftercare following surgery on the circulatory system: Secondary | ICD-10-CM | POA: Diagnosis not present

## 2018-08-26 DIAGNOSIS — D649 Anemia, unspecified: Secondary | ICD-10-CM | POA: Diagnosis not present

## 2018-08-26 DIAGNOSIS — Z7984 Long term (current) use of oral hypoglycemic drugs: Secondary | ICD-10-CM | POA: Diagnosis not present

## 2018-08-26 DIAGNOSIS — K819 Cholecystitis, unspecified: Secondary | ICD-10-CM | POA: Diagnosis not present

## 2018-08-26 DIAGNOSIS — I739 Peripheral vascular disease, unspecified: Secondary | ICD-10-CM | POA: Diagnosis not present

## 2018-08-29 ENCOUNTER — Other Ambulatory Visit: Payer: Self-pay | Admitting: Cardiovascular Disease

## 2018-08-29 DIAGNOSIS — K819 Cholecystitis, unspecified: Secondary | ICD-10-CM | POA: Diagnosis not present

## 2018-08-29 DIAGNOSIS — Z7984 Long term (current) use of oral hypoglycemic drugs: Secondary | ICD-10-CM | POA: Diagnosis not present

## 2018-08-29 DIAGNOSIS — E1151 Type 2 diabetes mellitus with diabetic peripheral angiopathy without gangrene: Secondary | ICD-10-CM | POA: Diagnosis not present

## 2018-08-29 DIAGNOSIS — Z48812 Encounter for surgical aftercare following surgery on the circulatory system: Secondary | ICD-10-CM | POA: Diagnosis not present

## 2018-08-29 DIAGNOSIS — I739 Peripheral vascular disease, unspecified: Secondary | ICD-10-CM | POA: Diagnosis not present

## 2018-08-29 DIAGNOSIS — D649 Anemia, unspecified: Secondary | ICD-10-CM | POA: Diagnosis not present

## 2018-08-30 DIAGNOSIS — Z87891 Personal history of nicotine dependence: Secondary | ICD-10-CM | POA: Diagnosis not present

## 2018-08-30 DIAGNOSIS — D649 Anemia, unspecified: Secondary | ICD-10-CM | POA: Diagnosis not present

## 2018-08-30 DIAGNOSIS — E1151 Type 2 diabetes mellitus with diabetic peripheral angiopathy without gangrene: Secondary | ICD-10-CM | POA: Diagnosis not present

## 2018-08-30 DIAGNOSIS — I10 Essential (primary) hypertension: Secondary | ICD-10-CM | POA: Diagnosis not present

## 2018-08-30 DIAGNOSIS — I739 Peripheral vascular disease, unspecified: Secondary | ICD-10-CM | POA: Diagnosis not present

## 2018-08-30 DIAGNOSIS — Z7902 Long term (current) use of antithrombotics/antiplatelets: Secondary | ICD-10-CM | POA: Diagnosis not present

## 2018-08-30 DIAGNOSIS — K819 Cholecystitis, unspecified: Secondary | ICD-10-CM | POA: Diagnosis not present

## 2018-08-30 DIAGNOSIS — Z7984 Long term (current) use of oral hypoglycemic drugs: Secondary | ICD-10-CM | POA: Diagnosis not present

## 2018-08-30 DIAGNOSIS — Z48812 Encounter for surgical aftercare following surgery on the circulatory system: Secondary | ICD-10-CM | POA: Diagnosis not present

## 2018-08-30 DIAGNOSIS — I2510 Atherosclerotic heart disease of native coronary artery without angina pectoris: Secondary | ICD-10-CM | POA: Diagnosis not present

## 2018-08-31 ENCOUNTER — Ambulatory Visit
Admission: RE | Admit: 2018-08-31 | Discharge: 2018-08-31 | Disposition: A | Discharge status: Home / Self Care | Payer: Medicare Other | Source: Ambulatory Visit | Attending: Radiology | Admitting: Radiology

## 2018-08-31 ENCOUNTER — Encounter: Payer: Self-pay | Admitting: Radiology

## 2018-08-31 ENCOUNTER — Ambulatory Visit
Admission: RE | Admit: 2018-08-31 | Discharge: 2018-08-31 | Disposition: A | Discharge status: Home / Self Care | Payer: Medicare Other | Source: Ambulatory Visit | Attending: Surgery | Admitting: Surgery

## 2018-08-31 DIAGNOSIS — K810 Acute cholecystitis: Secondary | ICD-10-CM

## 2018-08-31 DIAGNOSIS — K831 Obstruction of bile duct: Secondary | ICD-10-CM | POA: Diagnosis not present

## 2018-08-31 HISTORY — PX: IR RADIOLOGIST EVAL & MGMT: IMG5224

## 2018-08-31 NOTE — Progress Notes (Signed)
Referring Physician(s): Dr Deland Pretty  Chief Complaint: The patient is seen in follow up today s/p cholecystostomy drain placed 07/19/18; Drain injection 08/04/18 showed occluded cystic duct   History of present illness:  Pt here today for injection of chole drain Denies N/V Denies pain Denies fever/chills Flushes easily; OP is minimal-- bilious  Follows with Dr Annye English-- appt is set for March 9  Past Medical History:  Diagnosis Date  . Anemia   . Complication of anesthesia   . Diabetes mellitus ([place])    TYPE 2  . GERD (gastroesophageal reflux disease)   . History of blood transfusion    GI bleed  . History of colon polyps   . History of hiatal hernia   . Hyperlipemia   . Hypertension   . Osteoarthritis   . PAD (peripheral artery disease) ([facility])    a. stenting of his left common iliac artery >20 years ago. b. h/o LEIA stent and 2 stents to R SFA in 2011. c. 04/2014:  s/p PTA of right SFA for in-stent restenosis, occluded left SFA  . PONV (postoperative nausea and vomiting)    no porblem with the last 3 surgeries  . RBBB (right bundle branch block with left anterior fascicular block)    NUCLEAR STRESS TEST, 08/18/2010 - no significant wall motion abnoramlities noted, post-stress EF 69%, normal myocardial perfusion study  . Sinus tachycardia    a. Noted during admission 04/2014 but upon review seems to be frequent finding for patient.  . Stenosis of carotid artery    a. 50% right carotid stenosis by angiogram 04/2014.    Past Surgical History:  Procedure Laterality Date  . ABDOMINAL AORTOGRAM W/LOWER EXTREMITY N/A 01/27/2017   Procedure: Abdominal Aortogram w/Lower Extremity;  Surgeon: Serafina Mitchell, MD;  Location: [place] [facility];  Service: Cardiovascular;  Laterality: N/A;  . ABDOMINAL AORTOGRAM W/LOWER EXTREMITY N/A 06/08/2017   Procedure: ABDOMINAL AORTOGRAM W/LOWER EXTREMITY;  Surgeon: Serafina Mitchell, MD;  Location: [place] [facility];  Service:  Cardiovascular;  Laterality: N/A;  . ABDOMINAL AORTOGRAM W/LOWER EXTREMITY N/A 08/31/2017   Procedure: ABDOMINAL AORTOGRAM W/LOWER EXTREMITY;  Surgeon: Serafina Mitchell, MD;  Location: [place] [facility];  Service: Cardiovascular;  Laterality: N/A;  rt. unilateral  . ANGIOPLASTY / STENTING FEMORAL    . ANGIOPLASTY / STENTING ILIAC    . AORTA - BILATERAL FEMORAL ARTERY BYPASS GRAFT N/A 07/06/2018   Procedure: AORTA BIFEMORAL BYPASS GRAFT USING 14X7MM X 40CM HEMASHIELD GOLD GRAFT;  Surgeon: Serafina Mitchell, MD;  Location: [place];  Service: Vascular;  Laterality: N/A;  . CEREBRAL ANGIOGRAM N/A 05/14/2014   Procedure: CEREBRAL ANGIOGRAM;  Surgeon: Lorretta Harp, MD;  Location: [facility] CATH LAB;  Service: Cardiovascular;  Laterality: N/A;  . COLONOSCOPY W/ POLYPECTOMY    . ENDARTERECTOMY Right 09/08/2017  . ENDARTERECTOMY FEMORAL Right 09/08/2017   Procedure: REDO RIGHT FEMORAL ENDARTECTOMY WITH PATCH ANGIOPLASTY.;  Surgeon: Serafina Mitchell, MD;  Location: [place];  Service: Vascular;  Laterality: Right;  . EYE SURGERY Bilateral    cataract  . FEMORAL ARTERY STENT Right 05/12/2010   Stented distally with a 6x100 Abbott absolute stent and proximally with a 6x60 Cook Zilver stent resulting in the reduction of the proximal segment 80% and mid segment 60-70% to 0% residual, LEFT common femoral artery stented with a 7x3 Smart stent resulting in reduction of 90% stenosis to 0% residual  . FEMORAL-POPLITEAL BYPASS GRAFT Right 09/18/2016   Procedure: BYPASS GRAFT FEMORAL-POPLITEAL ARTERY;  Surgeon: Serafina Mitchell, MD;  Location: [place];  Service: Vascular;  Laterality: Right;  . FEMORAL-POPLITEAL BYPASS GRAFT Right 09/08/2017   Procedure: REDO BYPASS GRAFT FEMORAL-POPLITEAL ARTERY;  Surgeon: Serafina Mitchell, MD;  Location: [place];  Service: Vascular;  Laterality: Right;  . FEMORAL-POPLITEAL BYPASS GRAFT Right 07/06/2018   Procedure: REVISION RIGHT FEMORAL TO POPLITEAL ARTERY BYPASS GRAFT;  Surgeon: Serafina Mitchell,  MD;  Location: [place];  Service: Vascular;  Laterality: Right;  . IR CHOLANGIOGRAM EXISTING TUBE  08/04/2018  . IR PERC CHOLECYSTOSTOMY  07/19/2018  . IR RADIOLOGIST EVAL & MGMT  08/31/2018  . KNEE ARTHROSCOPY     left  . LOWER EXTREMITY ANGIOGRAM N/A 05/14/2014   Procedure: LOWER EXTREMITY ANGIOGRAM;  Surgeon: Lorretta Harp, MD;  Location: [facility] CATH LAB;  Service: Cardiovascular;  Laterality: N/A;  . LOWER EXTREMITY ANGIOGRAPHY N/A 09/21/2016   Procedure: Lower Extremity Angiography;  Surgeon: Conrad Emerald, MD;  Location: [place] [facility];  Service: Cardiovascular;  Laterality: N/A;  . PERIPHERAL VASCULAR ATHERECTOMY Right 01/27/2017   Procedure: PERIPHERAL VASCULAR ATHERECTOMY;  Surgeon: Serafina Mitchell, MD;  Location: [place] [facility];  Service: Cardiovascular;  Laterality: Right;  . PERIPHERAL VASCULAR BALLOON ANGIOPLASTY Right 06/08/2017   Procedure: PERIPHERAL VASCULAR BALLOON ANGIOPLASTY;  Surgeon: Serafina Mitchell, MD;  Location: [place] [facility];  Service: Cardiovascular;  Laterality: Right;  common femoral and superficial femoral arteries  . PERIPHERAL VASCULAR CATHETERIZATION N/A 04/20/2016   Procedure: Lower Extremity Intervention;  Surgeon: Lorretta Harp, MD;  Location: [place] [facility];  Service: Cardiovascular;  Laterality: N/A;  . PERIPHERAL VASCULAR INTERVENTION  08/31/2017   Procedure: PERIPHERAL VASCULAR INTERVENTION;  Surgeon: Serafina Mitchell, MD;  Location: [place] [facility];  Service: Cardiovascular;;  REIA  . REVERSE SHOULDER ARTHROPLASTY Right 12/07/2016  . REVERSE SHOULDER ARTHROPLASTY Right 12/07/2016   Procedure: REVERSE SHOULDER ARTHROPLASTY;  Surgeon: Netta Cedars, MD;  Location: [place];  Service: Orthopedics;  Laterality: Right;  . ROTATOR CUFF REPAIR Right 2003  . SFA Right 05/14/2014   PTA  OF RT SFA         DR BERRY    Allergies: Asa [aspirin]; Gadolinium derivatives; and Oxycodone-acetaminophen  Medications: Prior to Admission medications     Medication Sig Start Date End Date Taking? Authorizing Provider  amoxicillin-clavulanate (AUGMENTIN) 875-125 MG tablet Take 1 tablet by mouth every 12 (twelve) hours. 07/29/18   Ulyses Amor, PA-C  clopidogrel (PLAVIX) 75 MG tablet TAKE 1 TABLET DAILY 08/29/18   Lorretta Harp, MD  Coenzyme Q10 (COQ10 PO) Take 1 capsule by mouth every evening.     [provider]  docusate sodium (COLACE CLEAR) 50 MG capsule Take 50 mg by mouth 2 (two) times daily as needed for mild constipation.    [provider]  glucosamine-chondroitin 500-400 MG tablet Take 1 tablet by mouth every evening.     [provider]  lisinopril (PRINIVIL,ZESTRIL) 5 MG tablet Take 5 mg by mouth 2 (two) times daily.     [provider]  metFORMIN (GLUCOPHAGE) 1000 MG tablet Take 1 tablet (1,000 mg total) by mouth 2 (two) times daily with a meal. 05/16/14   Dunn, Dayna N, PA-C  metoprolol tartrate (LOPRESSOR) 25 MG tablet Take 1 tablet (25 mg total) by mouth 2 (two) times daily. 07/29/18   Ulyses Amor, PA-C  Omega-3 Fatty Acids (FISH OIL) 1200 MG CAPS Take 1,200 mg by mouth every evening.  [provider]  ondansetron (ZOFRAN ODT) 4 MG disintegrating tablet Take 1 tablet (4 mg total) by mouth every 8 (eight) hours as needed for nausea or vomiting. Patient not taking: Reported on 08/03/2018 08/02/18   Serafina Mitchell, MD  ondansetron (ZOFRAN) 4 MG tablet Take 1 tablet (4 mg total) by mouth daily as needed for nausea or vomiting. Patient not taking: Reported on 08/03/2018 08/02/18   Serafina Mitchell, MD  pantoprazole (PROTONIX) 40 MG tablet Take 40 mg by mouth twice daily Patient taking differently: Take 40 mg by mouth 2 (two) times daily.  05/30/18   Lorretta Harp, MD  rosuvastatin (CRESTOR) 20 MG tablet Take 20 mg by mouth every evening.     [provider]  scopolamine (TRANSDERM-SCOP) 1 MG/3DAYS Place 1 patch (1.5 mg total) onto the skin every 3 (three) days for 10 doses.  08/08/18 09/05/18  Ulyses Amor, PA-C  traMADol (ULTRAM) 50 MG tablet Take 1 tablet (50 mg total) by mouth every 6 (six) hours as needed for moderate pain. 08/08/18   Ulyses Amor, PA-C     Family History  Problem Relation Age of Onset  . Heart disease Mother   . Leukemia Mother   . Stomach cancer Father   . Esophageal cancer Brother   . Liver disease Brother   . Alcoholism Brother   . Leukemia Brother   . Leukemia Brother   . Diabetes type II Brother   . Lung disease Sister     Social History   Socioeconomic History  . Marital status: Married    Spouse name: Not on file  . Number of children: 2  . Years of education: Not on file  . Highest education level: Not on file  Occupational History  . Occupation: retired    Fish farm manager: RETIRED  Social Needs  . Financial resource strain: Not on file  . Food insecurity:    Worry: Not on file    Inability: Not on file  . Transportation needs:    Medical: Not on file    Non-medical: Not on file  Tobacco Use  . Smoking status: Former Smoker    Years: 25.00    Last attempt to quit: 06/1988    Years since quitting: 30.1  . Smokeless tobacco: Never Used  Substance and Sexual Activity  . Alcohol use: Yes    Comment: rarely  . Drug use: No  . Sexual activity: Not on file  Lifestyle  . Physical activity:    Days per week: Not on file    Minutes per session: Not on file  . Stress: Not on file  Relationships  . Social connections:    Talks on phone: Not on file    Gets together: Not on file    Attends religious service: Not on file    Active member of club or organization: Not on file    Attends meetings of clubs or organizations: Not on file    Relationship status: Not on file  Other Topics Concern  . Not on file  Social History Narrative  . Not on file     Vital Signs: BP 140/61   Pulse 89   Temp 98.7 F (37.1 C)   SpO2 100%   Physical Exam Vitals signs reviewed.  Skin:    General: Skin is warm and dry.      Comments: Site is clean and dry NT no bleeding No sign of infection OP bilious -- very little in drain  bag  Neurological:     Mental Status: He is alert.   Injection today still shows NO flow to cystic duct  Imaging: Ir Radiologist Eval & Mgmt  Result Date: 08/31/2018 Please refer to notes tab for details about interventional procedure. (Op Note)   Labs:  CBC: Recent Labs    07/28/18 0457 08/03/18 1220 08/04/18 0256 08/05/18 0802  WBC 9.9 11.3* 7.7 7.1  HGB 7.9* 9.0* 7.9* 8.9*  HCT 25.9* 29.3* 25.5* 27.9*  PLT 394 505* 374 397    COAGS: Recent Labs    09/06/17 0923 07/01/18 0926 07/06/18 1600 08/03/18 1848  INR 1.04 1.04 1.25 1.05  APTT 31 30 27   --     BMP: Recent Labs    07/26/18 0500 07/27/18 0506 07/28/18 0457 08/03/18 1220 08/04/18 0256  NA 141 142 140 139  --   K 4.1 4.1 3.8 4.4  --   CL 106 109 105 103  --   CO2 24 25 26 25   --   GLUCOSE 126* 136* 112* 173*  --   BUN 37* 31* 27* 22  --   CALCIUM 7.7* 8.0* 7.9* 8.7*  --   CREATININE 1.60* 1.51* 1.50* 1.91* 1.55*  GFRNONAA 41* 44* 44* 33* 43*  GFRAA 47* 51* 51* 38* 49*    LIVER FUNCTION TESTS: Recent Labs    07/18/18 0437 07/21/18 0300 07/25/18 0417 08/03/18 1220  BILITOT 1.0 0.6 0.7 0.5  AST 31 55* 23 33  ALT 16 33 23 30  ALKPHOS 86 118 108 104  PROT 5.3* 5.7* 6.0* 7.0  ALBUMIN 2.0* 1.8* 1.8* 2.4*    Assessment:  Percutaneous cholecystostomy drain intact Functioning well Plan: continue drain  To see Dr Dema Severin March 9 Plan per [facility] Wife is aware of plan-- agreeable  Signed: Lavonia Drafts, PA-C 08/31/2018, 1:24 PM   Please refer to Dr. Vernard Gambles attestation of this note for management and plan.

## 2018-09-01 DIAGNOSIS — I739 Peripheral vascular disease, unspecified: Secondary | ICD-10-CM | POA: Diagnosis not present

## 2018-09-01 DIAGNOSIS — K819 Cholecystitis, unspecified: Secondary | ICD-10-CM | POA: Diagnosis not present

## 2018-09-01 DIAGNOSIS — I10 Essential (primary) hypertension: Secondary | ICD-10-CM | POA: Diagnosis not present

## 2018-09-01 DIAGNOSIS — E1151 Type 2 diabetes mellitus with diabetic peripheral angiopathy without gangrene: Secondary | ICD-10-CM | POA: Diagnosis not present

## 2018-09-01 DIAGNOSIS — Z48812 Encounter for surgical aftercare following surgery on the circulatory system: Secondary | ICD-10-CM | POA: Diagnosis not present

## 2018-09-01 DIAGNOSIS — D649 Anemia, unspecified: Secondary | ICD-10-CM | POA: Diagnosis not present

## 2018-09-02 DIAGNOSIS — D649 Anemia, unspecified: Secondary | ICD-10-CM | POA: Diagnosis not present

## 2018-09-02 DIAGNOSIS — I10 Essential (primary) hypertension: Secondary | ICD-10-CM | POA: Diagnosis not present

## 2018-09-02 DIAGNOSIS — Z48812 Encounter for surgical aftercare following surgery on the circulatory system: Secondary | ICD-10-CM | POA: Diagnosis not present

## 2018-09-02 DIAGNOSIS — E1151 Type 2 diabetes mellitus with diabetic peripheral angiopathy without gangrene: Secondary | ICD-10-CM | POA: Diagnosis not present

## 2018-09-02 DIAGNOSIS — K819 Cholecystitis, unspecified: Secondary | ICD-10-CM | POA: Diagnosis not present

## 2018-09-02 DIAGNOSIS — I739 Peripheral vascular disease, unspecified: Secondary | ICD-10-CM | POA: Diagnosis not present

## 2018-09-12 ENCOUNTER — Ambulatory Visit (INDEPENDENT_AMBULATORY_CARE_PROVIDER_SITE_OTHER): Payer: Self-pay | Admitting: Surgery

## 2018-09-12 ENCOUNTER — Other Ambulatory Visit: Payer: Self-pay

## 2018-09-12 ENCOUNTER — Encounter: Payer: Self-pay | Admitting: Surgery

## 2018-09-12 VITALS — BP 115/68 | HR 90 | Temp 96.8°F | Resp 16 | Ht 65.0 in | Wt 137.0 lb

## 2018-09-12 DIAGNOSIS — I739 Peripheral vascular disease, unspecified: Secondary | ICD-10-CM

## 2018-09-12 NOTE — Progress Notes (Signed)
Patient name: Christian Bailey MRN: [id number] DOB: [birth year] Sex: male  REASON FOR VISIT:     post op  HISTORY OF PRESENT ILLNESS:   Christian Bailey is a 78 y.o. male who returns today for follow-up.  He initially underwent right femoral-popliteal bypass graft with vein on 09/18/2016 for claudication.  He has undergone percutaneous as well as surgical revisions of his proximal bypass.  He still is having challenges with claudication.  Ultimately I felt his inflow was the issue and so on 07/06/2018 he underwent an aortobifemoral bypass graft.  Also revised the proximal portion of his right femoropopliteal bypass with an interposition Gore-Tex.  He had a prolonged hospital course, lasting several weeks which ultimately was related to acute cholecystitis.  He had a percutaneous cholecystostomy tube placed.  He is back today for follow-up.  He is at home.  He is walking now approximately 1000 steps.  Prior to surgery he was walking 200 steps.  CURRENT MEDICATIONS:    Current Outpatient Medications  Medication Sig Dispense Refill  . clopidogrel (PLAVIX) 75 MG tablet TAKE 1 TABLET DAILY 90 tablet 1  . Coenzyme Q10 (COQ10 PO) Take 1 capsule by mouth every evening.     . docusate sodium (COLACE CLEAR) 50 MG capsule Take 50 mg by mouth 2 (two) times daily as needed for mild constipation.    [name] glucosamine-chondroitin 500-400 MG tablet Take 1 tablet by mouth every evening.     [name] lisinopril (PRINIVIL,ZESTRIL) 5 MG tablet Take 5 mg by mouth 2 (two) times daily.     . metFORMIN (GLUCOPHAGE) 1000 MG tablet Take 1 tablet (1,000 mg total) by mouth 2 (two) times daily with a meal.    . metoprolol tartrate (LOPRESSOR) 25 MG tablet Take 1 tablet (25 mg total) by mouth 2 (two) times daily. 30 tablet 0  . Omega-3 Fatty Acids (FISH OIL) 1200 MG CAPS Take 1,200 mg by mouth every evening.     . ondansetron (ZOFRAN ODT) 4 MG disintegrating tablet Take 1 tablet (4 mg total) by mouth every 8  (eight) hours as needed for nausea or vomiting. 20 tablet 0  . pantoprazole (PROTONIX) 40 MG tablet Take 40 mg by mouth twice daily (Patient taking differently: Take 40 mg by mouth 2 (two) times daily. ) 180 tablet 0  . rosuvastatin (CRESTOR) 20 MG tablet Take 20 mg by mouth every evening.     . TOPROL XL 25 MG 24 hr tablet     . amoxicillin-clavulanate (AUGMENTIN) 875-125 MG tablet Take 1 tablet by mouth every 12 (twelve) hours. (Patient not taking: Reported on 09/12/2018) 30 tablet 2  . ondansetron (ZOFRAN) 4 MG tablet Take 1 tablet (4 mg total) by mouth daily as needed for nausea or vomiting. (Patient not taking: Reported on 09/12/2018) 30 tablet 1  . traMADol (ULTRAM) 50 MG tablet Take 1 tablet (50 mg total) by mouth every 6 (six) hours as needed for moderate pain. (Patient not taking: Reported on 09/12/2018) 30 tablet 0   Current Facility-Administered Medications  Medication Dose Route Frequency Provider Last Rate Last Dose  . 0.9 %  sodium chloride infusion  500 mL Intravenous Once Nelida Meuse III, MD      . 0.9 %  sodium chloride infusion  500 mL Intravenous Once Nelida Meuse III, MD        REVIEW OF SYSTEMS:   [X]  denotes positive finding, [ ]  denotes negative finding Cardiac  Comments:  Chest pain or chest pressure:  Shortness of breath upon exertion:    Short of breath when lying flat:    Irregular heart rhythm:    Constitutional    Fever or chills:      PHYSICAL EXAM:   Vitals:   09/12/18 1242  BP: 115/68  Pulse: 90  Resp: 16  Temp: (!) 96.8 F (36 C)  TempSrc: Oral  SpO2: 98%  Weight: 62.1 kg  Height: 5\' 5"  (1.651 m)    GENERAL: The patient is a well-nourished male, in no acute distress. The vital signs are documented above. CARDIOVASCULAR: There is a regular rate and rhythm. PULMONARY: Non-labored respirations Incisions have healed nicely.  He has a palpable right dorsalis pedis pulse.  I cannot palpate the left dorsalis pedis  STUDIES:   None    MEDICAL ISSUES:   Patient is doing remarkably well at this time.  I have ordered physical therapy and occupational therapy to be done on an outpatient basis.  He needs to continue to increase his activity level.  He is following up with general surgery regarding his biliary drain.  Have him scheduled to follow-up with me in 2 months with ABIs and duplex.  Leia Alf, MD, FACS [facility] [phone] Pager [phone]

## 2018-09-13 DIAGNOSIS — H353212 Exudative age-related macular degeneration, right eye, with inactive choroidal neovascularization: Secondary | ICD-10-CM | POA: Diagnosis not present

## 2018-09-13 DIAGNOSIS — H353221 Exudative age-related macular degeneration, left eye, with active choroidal neovascularization: Secondary | ICD-10-CM | POA: Diagnosis not present

## 2018-09-13 DIAGNOSIS — H353124 Nonexudative age-related macular degeneration, left eye, advanced atrophic with subfoveal involvement: Secondary | ICD-10-CM | POA: Diagnosis not present

## 2018-09-13 DIAGNOSIS — H353113 Nonexudative age-related macular degeneration, right eye, advanced atrophic without subfoveal involvement: Secondary | ICD-10-CM | POA: Diagnosis not present

## 2018-09-20 ENCOUNTER — Ambulatory Visit: Payer: Medicare Other | Admitting: Occupational Therapy

## 2018-10-05 ENCOUNTER — Other Ambulatory Visit: Payer: Self-pay | Admitting: Cardiovascular Disease

## 2018-10-06 NOTE — Telephone Encounter (Signed)
Pantoprazole refilled. 

## 2018-10-25 ENCOUNTER — Other Ambulatory Visit: Payer: Self-pay

## 2018-10-25 DIAGNOSIS — I739 Peripheral vascular disease, unspecified: Secondary | ICD-10-CM

## 2018-11-10 ENCOUNTER — Telehealth (HOSPITAL_COMMUNITY): Payer: Self-pay

## 2018-11-10 NOTE — Telephone Encounter (Signed)
The above patient or their representative was contacted and gave the following answers to these questions:         Do you have any of the following symptoms? No  Fever                    Cough                   Shortness of breath  Do  you have any of the following other symptoms? No   muscle pain         vomiting,        diarrhea        rash         weakness        red eye        abdominal pain         bruising          bruising or bleeding              joint pain           severe headache    Have you been in contact with someone who was or has been sick in the past 2 weeks? No  Yes                 Unsure                         Unable to assess   Does the person that you were in contact with have any of the following symptoms?   Cough         shortness of breath           muscle pain         vomiting,            diarrhea            rash            weakness           fever            red eye           abdominal pain           bruising  or  bleeding                joint pain                severe headache               Have you  or someone you have been in contact with traveled internationally in th last month?    NO     If yes, which countries?   Have you  or someone you have been in contact with traveled outside [place] in th last month?     No   If yes, which state and city?   COMMENTS OR ACTION PLAN FOR THIS PATIENT:

## 2018-11-14 ENCOUNTER — Other Ambulatory Visit: Payer: Self-pay

## 2018-11-14 ENCOUNTER — Encounter (HOSPITAL_COMMUNITY): Payer: Medicare Other

## 2018-11-14 ENCOUNTER — Encounter: Payer: Self-pay | Admitting: Surgery

## 2018-11-14 ENCOUNTER — Ambulatory Visit (INDEPENDENT_AMBULATORY_CARE_PROVIDER_SITE_OTHER): Payer: Medicare Other | Admitting: Surgery

## 2018-11-14 ENCOUNTER — Ambulatory Visit (HOSPITAL_COMMUNITY)
Admission: RE | Admit: 2018-11-14 | Discharge: 2018-11-14 | Disposition: A | Discharge status: Home / Self Care | Payer: Medicare Other | Source: Ambulatory Visit | Attending: Surgery | Admitting: Surgery

## 2018-11-14 ENCOUNTER — Ambulatory Visit: Payer: Medicare Other | Admitting: Surgery

## 2018-11-14 VITALS — BP 109/58 | HR 76 | Temp 97.5°F | Resp 20 | Ht 65.0 in | Wt 135.0 lb

## 2018-11-14 DIAGNOSIS — I739 Peripheral vascular disease, unspecified: Secondary | ICD-10-CM | POA: Diagnosis not present

## 2018-11-14 NOTE — Progress Notes (Signed)
[facility]  Patient name: Christian Bailey MRN: [id number] DOB: Sep 29, 1940 Sex: male   REASON FOR VISIT:    Follow up  HISOTRY OF PRESENT ILLNESS:    Christian Bailey is a 78 y.o. male who returns today for follow-up.  He initially underwent right femoral-popliteal bypass graft with vein on 09/18/2016 for claudication.  He has undergone percutaneous as well as surgical revisions of his proximal bypass.  He still is having challenges with claudication.  Ultimately I felt his inflow was the issue and so on 07/06/2018 he underwent an aortobifemoral bypass graft.  Also revised the proximal portion of his right femoropopliteal bypass with an interposition Gore-Tex.  He had a prolonged hospital course, lasting several weeks which ultimately was related to acute cholecystitis.  He had a percutaneous cholecystostomy tube placed.  He is back today for follow-up.  He is at home.  He is walking now approximately 1000 steps.  Prior to surgery he was walking 200 steps.  He is getting frustrated about his walking distance.  His weight is down 15 lbs.  His billi drain is still in place.   PAST MEDICAL HISTORY:   Past Medical History:  Diagnosis Date  . Anemia   . Complication of anesthesia   . Diabetes mellitus ([facility])    TYPE 2  . GERD (gastroesophageal reflux disease)   . History of blood transfusion    GI bleed  . History of colon polyps   . History of hiatal hernia   . Hyperlipemia   . Hypertension   . Osteoarthritis   . PAD (peripheral artery disease) (HCC)    a. stenting of his left common iliac artery >20 years ago. b. h/o LEIA stent and 2 stents to R SFA in 2011. c. 04/2014:  s/p PTA of right SFA for in-stent restenosis, occluded left SFA  . PONV (postoperative nausea and vomiting)    no porblem with the last 3 surgeries  . RBBB (right bundle branch block with left anterior fascicular block)    NUCLEAR STRESS TEST, 08/18/2010 - no  significant wall motion abnoramlities noted, post-stress EF 69%, normal myocardial perfusion study  . Sinus tachycardia    a. Noted during admission 04/2014 but upon review seems to be frequent finding for patient.  . Stenosis of carotid artery    a. 50% right carotid stenosis by angiogram 04/2014.     FAMILY HISTORY:   Family History  Problem Relation Age of Onset  . Heart disease Mother   . Leukemia Mother   . Stomach cancer Father   . Esophageal cancer Brother   . Liver disease Brother   . Alcoholism Brother   . Leukemia Brother   . Leukemia Brother   . Diabetes type II Brother   . Lung disease Sister     SOCIAL HISTORY:   Social History   Tobacco Use  . Smoking status: Former Smoker    Years: 25.00    Last attempt to quit: 06/1988    Years since quitting: 30.3  . Smokeless tobacco: Never Used  Substance Use Topics  . Alcohol use: Yes    Comment: rarely     ALLERGIES:   Allergies  Allergen Reactions  . Asa [Aspirin] Other (See Comments)    GI bleeding  . Gadolinium Derivatives Itching, Swelling and Other (See Comments)    Pt complained of face flushing/hottness and throat tightness/scratchiness immediately after the injections.  Within 4 minutes, all symptoms were gone and the  study was completed.  No further complications or signs of allergy were exhibited after completion of study.   . Oxycodone-Acetaminophen Other (See Comments)    Dizziness and feeling of being uncomfortable      CURRENT MEDICATIONS:   Current Outpatient Medications  Medication Sig Dispense Refill  . amoxicillin-clavulanate (AUGMENTIN) 875-125 MG tablet Take 1 tablet by mouth every 12 (twelve) hours. (Patient not taking: Reported on 09/12/2018) 30 tablet 2  . clopidogrel (PLAVIX) 75 MG tablet TAKE 1 TABLET DAILY 90 tablet 1  . Coenzyme Q10 (COQ10 PO) Take 1 capsule by mouth every evening.     . docusate sodium (COLACE CLEAR) 50 MG capsule Take 50 mg by mouth 2 (two) times daily as  needed for mild constipation.    [name] glucosamine-chondroitin 500-400 MG tablet Take 1 tablet by mouth every evening.     [name] lisinopril (PRINIVIL,ZESTRIL) 5 MG tablet Take 5 mg by mouth 2 (two) times daily.     . metFORMIN (GLUCOPHAGE) 1000 MG tablet Take 1 tablet (1,000 mg total) by mouth 2 (two) times daily with a meal.    . metoprolol tartrate (LOPRESSOR) 25 MG tablet Take 1 tablet (25 mg total) by mouth 2 (two) times daily. 30 tablet 0  . Omega-3 Fatty Acids (FISH OIL) 1200 MG CAPS Take 1,200 mg by mouth every evening.     . ondansetron (ZOFRAN ODT) 4 MG disintegrating tablet Take 1 tablet (4 mg total) by mouth every 8 (eight) hours as needed for nausea or vomiting. 20 tablet 0  . ondansetron (ZOFRAN) 4 MG tablet Take 1 tablet (4 mg total) by mouth daily as needed for nausea or vomiting. (Patient not taking: Reported on 09/12/2018) 30 tablet 1  . pantoprazole (PROTONIX) 40 MG tablet TAKE 1 TABLET TWICE A DAY 180 tablet 0  . rosuvastatin (CRESTOR) 20 MG tablet Take 20 mg by mouth every evening.     . TOPROL XL 25 MG 24 hr tablet     . traMADol (ULTRAM) 50 MG tablet Take 1 tablet (50 mg total) by mouth every 6 (six) hours as needed for moderate pain. (Patient not taking: Reported on 09/12/2018) 30 tablet 0   Current Facility-Administered Medications  Medication Dose Route Frequency Provider Last Rate Last Dose  . 0.9 %  sodium chloride infusion  500 mL Intravenous Once Nelida Meuse III, MD      . 0.9 %  sodium chloride infusion  500 mL Intravenous Once Danis, [place], MD        REVIEW OF SYSTEMS:   [X]  denotes positive finding, [ ]  denotes negative finding Cardiac  Comments:  Chest pain or chest pressure:    Shortness of breath upon exertion:    Short of breath when lying flat:    Irregular heart rhythm:        Vascular    Pain in calf, thigh, or hip brought on by ambulation:    Pain in feet at night that wakes you up from your sleep:     Blood clot in your veins:    Leg swelling:          Pulmonary    Oxygen at home:    Productive cough:     Wheezing:         Neurologic    Sudden weakness in arms or legs:     Sudden numbness in arms or legs:     Sudden onset of difficulty speaking or slurred speech:    Temporary  loss of vision in one eye:     Problems with dizziness:         Gastrointestinal    Blood in stool:     Vomited blood:         Genitourinary    Burning when urinating:     Blood in urine:        Psychiatric    Major depression:         Hematologic    Bleeding problems:    Problems with blood clotting too easily:        Skin    Rashes or ulcers:        Constitutional    Fever or chills:      PHYSICAL EXAM:   Vitals:   11/14/18 1055  BP: (!) 109/58  Pulse: 76  Resp: 20  Temp: (!) 97.5 F (36.4 C)  SpO2: 98%  Weight: 61.2 kg  Height: 5\' 5"  (1.651 m)    GENERAL: The patient is a well-nourished male, in no acute distress. The vital signs are documented above. CARDIAC: There is a regular rate and rhythm.  VASCULAR: palpable bilateral femoral pulses.  Palpable right DP pulse PULMONARY: Non-labored respirations ABDOMEN: Soft and non-tender.  No incisional hernia MUSCULOSKELETAL: There are no major deformities or cyanosis. NEUROLOGIC: No focal weakness or paresthesias are detected. SKIN: There are no ulcers or rashes noted. PSYCHIATRIC: The patient has a normal affect.  STUDIES:   I have ordered and reviewed the following :  ABI/TBIToday's ABIToday's TBIPrevious ABIPrevious TBI +-------+-----------+-----------+------------+------------+ Right  0.93       0.56       0.41        0.29         +-------+-----------+-----------+------------+------------+ Left   0.70       0.36       0.66        0.55         +-------+-----------+-----------+------------+------------+   MEDICAL ISSUES:   F/u 3 months with BAI and duplex of both legs  Encouraged him to improve his diet and increase his protein content  Goal is  to be walking 400 steps in 3 months    Leia Alf, MD, FACS [facility] [phone] Pager [phone]

## 2018-11-17 DIAGNOSIS — H353222 Exudative age-related macular degeneration, left eye, with inactive choroidal neovascularization: Secondary | ICD-10-CM | POA: Diagnosis not present

## 2018-11-17 DIAGNOSIS — H538 Other visual disturbances: Secondary | ICD-10-CM | POA: Diagnosis not present

## 2018-11-17 DIAGNOSIS — H353112 Nonexudative age-related macular degeneration, right eye, intermediate dry stage: Secondary | ICD-10-CM | POA: Diagnosis not present

## 2018-11-24 DIAGNOSIS — L602 Onychogryphosis: Secondary | ICD-10-CM | POA: Diagnosis not present

## 2018-11-24 DIAGNOSIS — E1351 Other specified diabetes mellitus with diabetic peripheral angiopathy without gangrene: Secondary | ICD-10-CM | POA: Diagnosis not present

## 2018-12-20 ENCOUNTER — Other Ambulatory Visit: Payer: Self-pay | Admitting: Cardiovascular Disease

## 2019-01-03 DIAGNOSIS — I10 Essential (primary) hypertension: Secondary | ICD-10-CM | POA: Diagnosis not present

## 2019-01-03 DIAGNOSIS — I739 Peripheral vascular disease, unspecified: Secondary | ICD-10-CM | POA: Diagnosis not present

## 2019-01-03 DIAGNOSIS — K819 Cholecystitis, unspecified: Secondary | ICD-10-CM | POA: Diagnosis not present

## 2019-01-03 DIAGNOSIS — E1151 Type 2 diabetes mellitus with diabetic peripheral angiopathy without gangrene: Secondary | ICD-10-CM | POA: Diagnosis not present

## 2019-01-03 DIAGNOSIS — I2510 Atherosclerotic heart disease of native coronary artery without angina pectoris: Secondary | ICD-10-CM | POA: Diagnosis not present

## 2019-01-03 DIAGNOSIS — R634 Abnormal weight loss: Secondary | ICD-10-CM | POA: Diagnosis not present

## 2019-01-03 DIAGNOSIS — E785 Hyperlipidemia, unspecified: Secondary | ICD-10-CM | POA: Diagnosis not present

## 2019-01-03 DIAGNOSIS — D62 Acute posthemorrhagic anemia: Secondary | ICD-10-CM | POA: Diagnosis not present

## 2019-01-09 DIAGNOSIS — K819 Cholecystitis, unspecified: Secondary | ICD-10-CM | POA: Diagnosis not present

## 2019-01-10 ENCOUNTER — Telehealth: Payer: Self-pay | Admitting: *Deleted

## 2019-01-10 ENCOUNTER — Other Ambulatory Visit: Payer: Self-pay | Admitting: Surgery

## 2019-01-10 DIAGNOSIS — K810 Acute cholecystitis: Secondary | ICD-10-CM

## 2019-01-10 NOTE — Telephone Encounter (Signed)
   [facility] Pre-operative Risk Assessment    Request for surgical clearance:  1. What type of surgery is being performed? GALLBLADDER REMOVAL   2. When is this surgery scheduled? TBA  3. What type of clearance is required (medical clearance vs. Pharmacy clearance to hold med vs. Both)? MEDICAL  4. Are there any medications that need to be held prior to surgery and how long?PLAVIX   5. Practice name and name of physician performing surgery? [facility] :DR Harrell Gave WHITE  6. What is your office phone number [place], [place]    7.   What is your office fax number [place] , [place]  8.   Anesthesia type (None, local, MAC, general) ? GENERAL   Christian Bailey 01/10/2019, 9:58 AM  _________________________________________________________________   (provider comments below)

## 2019-01-10 NOTE — Telephone Encounter (Signed)
Spoke with patient who states that he would like to keep his scheduled appt with Dr. Gwenlyn Found on 8/4 at 9:15 am as he does not have set date when his surgery will be. I informed pt that was could move it to a sooner appt but didn't want to do that. Pt verbalized understanding and thanked me for the call.

## 2019-01-10 NOTE — Telephone Encounter (Signed)
   Primary Cardiologist:Jonathan Gwenlyn Found, MD  Chart reviewed as part of pre-operative protocol coverage. Because of Christian Bailey past medical history and time since last visit, he/she will require a follow-up visit in order to better assess preoperative cardiovascular risk.  NEEDS appt this week or next  Pre-op covering staff: - Please schedule appointment and call patient to inform them. - Please contact requesting surgeon's office via preferred method (i.e, phone, fax) to inform them of need for appointment prior to surgery.  If applicable, this message will also be routed to pharmacy pool and/or primary cardiologist for input on holding anticoagulant/antiplatelet agent as requested below so that this information is available at time of patient's appointment.   Cecilie Kicks, NP  01/10/2019, 11:57 AM

## 2019-01-17 ENCOUNTER — Encounter: Payer: Self-pay | Admitting: Radiology

## 2019-01-17 ENCOUNTER — Other Ambulatory Visit (HOSPITAL_COMMUNITY): Payer: Self-pay | Admitting: Interventional Radiology

## 2019-01-17 ENCOUNTER — Ambulatory Visit
Admission: RE | Admit: 2019-01-17 | Discharge: 2019-01-17 | Disposition: A | Discharge status: Home / Self Care | Payer: Medicare Other | Source: Ambulatory Visit | Attending: Surgery | Admitting: Surgery

## 2019-01-17 ENCOUNTER — Telehealth: Payer: Self-pay | Admitting: *Deleted

## 2019-01-17 DIAGNOSIS — K819 Cholecystitis, unspecified: Secondary | ICD-10-CM

## 2019-01-17 DIAGNOSIS — K810 Acute cholecystitis: Secondary | ICD-10-CM

## 2019-01-17 DIAGNOSIS — Z978 Presence of other specified devices: Secondary | ICD-10-CM | POA: Diagnosis not present

## 2019-01-17 HISTORY — PX: IR RADIOLOGIST EVAL & MGMT: IMG5224

## 2019-01-17 NOTE — Progress Notes (Signed)
Referring Physician(s): White,Christopher M  Chief Complaint: The patient is seen in follow up today s/p cholecystostomy tube placement   History of present illness: Christian Bailey is a 78 y.o. male with a past medical history significant for GERD, PAD, s/p aortobifemoral bypass grafting 07/06/18 with [name] which was complicated by development of post-op cholecystitis. He underwent cholecystostomy tube placement 07/19/18 by Dr. Vernard Gambles for suspected acalculous cholecystitis. At subsequent drain injections, his cystic duct remained occluded and patient was considering surgical removal of his gall bladder as of his last visit with IR in March. Patient returns to clinic today for re-evaluation of his drain.   He states he has been doing well at home.  Endorses occasional nausea without vomiting or abdominal pain.  Drain with little output.  He had a recent appointment with his surgeon who has requested repeat drain injection for care planning.   Past Medical History:  Diagnosis Date  . Anemia   . Complication of anesthesia   . Diabetes mellitus ([place] [facility])    TYPE 2  . GERD (gastroesophageal reflux disease)   . History of blood transfusion    GI bleed  . History of colon polyps   . History of hiatal hernia   . Hyperlipemia   . Hypertension   . Osteoarthritis   . PAD (peripheral artery disease) (H[facility])    a. stenting of his left common iliac artery >20 years ago. b. h/o LEIA stent and 2 stents to R SFA in 2011. c. 04/2014:  s/p PTA of right SFA for in-stent restenosis, occluded left SFA  . PONV (postoperative nausea and vomiting)    no porblem with the last 3 surgeries  . RBBB (right bundle branch block with left anterior fascicular block)    NUCLEAR STRESS TEST, 08/18/2010 - no significant wall motion abnoramlities noted, post-stress EF 69%, normal myocardial perfusion study  . Sinus tachycardia    a. Noted during admission 04/2014 but upon review seems to be frequent finding for patient.   . Stenosis of carotid artery    a. 50% right carotid stenosis by angiogram 04/2014.    Past Surgical History:  Procedure Laterality Date  . ABDOMINAL AORTOGRAM W/LOWER EXTREMITY N/A 01/27/2017   Procedure: Abdominal Aortogram w/Lower Extremity;  Surgeon: Serafina Mitchell, MD;  Location: [place] [facility];  Service: Cardiovascular;  Laterality: N/A;  . ABDOMINAL AORTOGRAM W/LOWER EXTREMITY N/A 06/08/2017   Procedure: ABDOMINAL AORTOGRAM W/LOWER EXTREMITY;  Surgeon: Serafina Mitchell, MD;  Location: [place] [facility];  Service: Cardiovascular;  Laterality: N/A;  . ABDOMINAL AORTOGRAM W/LOWER EXTREMITY N/A 08/31/2017   Procedure: ABDOMINAL AORTOGRAM W/LOWER EXTREMITY;  Surgeon: Serafina Mitchell, MD;  Location: [place] [facility];  Service: Cardiovascular;  Laterality: N/A;  rt. unilateral  . ANGIOPLASTY / STENTING FEMORAL    . ANGIOPLASTY / STENTING ILIAC    . AORTA - BILATERAL FEMORAL ARTERY BYPASS GRAFT N/A 07/06/2018   Procedure: AORTA BIFEMORAL BYPASS GRAFT USING 14X7MM X 40CM HEMASHIELD GOLD GRAFT;  Surgeon: Serafina Mitchell, MD;  Location: [facility];  Service: Vascular;  Laterality: N/A;  . CEREBRAL ANGIOGRAM N/A 05/14/2014   Procedure: CEREBRAL ANGIOGRAM;  Surgeon: Lorretta Harp, MD;  Location: [facility];  Service: Cardiovascular;  Laterality: N/A;  . COLONOSCOPY W/ POLYPECTOMY    . ENDARTERECTOMY Right 09/08/2017  . ENDARTERECTOMY FEMORAL Right 09/08/2017   Procedure: REDO RIGHT FEMORAL ENDARTECTOMY WITH PATCH ANGIOPLASTY.;  Surgeon: Serafina Mitchell, MD;  Location: [place];  Service: Vascular;  Laterality: Right;  . EYE SURGERY Bilateral    cataract  . FEMORAL ARTERY STENT Right 05/12/2010   Stented distally with a 6x100 Abbott absolute stent and proximally with a 6x60 Cook Zilver stent resulting in the reduction of the proximal segment 80% and mid segment 60-70% to 0% residual, LEFT common femoral artery stented with a 7x3 Smart stent resulting in reduction of 90% stenosis to 0% residual   . FEMORAL-POPLITEAL BYPASS GRAFT Right 09/18/2016   Procedure: BYPASS GRAFT FEMORAL-POPLITEAL ARTERY;  Surgeon: Serafina Mitchell, MD;  Location: [place];  Service: Vascular;  Laterality: Right;  . FEMORAL-POPLITEAL BYPASS GRAFT Right 09/08/2017   Procedure: REDO BYPASS GRAFT FEMORAL-POPLITEAL ARTERY;  Surgeon: Serafina Mitchell, MD;  Location: [place];  Service: Vascular;  Laterality: Right;  . FEMORAL-POPLITEAL BYPASS GRAFT Right 07/06/2018   Procedure: REVISION RIGHT FEMORAL TO POPLITEAL ARTERY BYPASS GRAFT;  Surgeon: Serafina Mitchell, MD;  Location: [place];  Service: Vascular;  Laterality: Right;  . IR CHOLANGIOGRAM EXISTING TUBE  08/04/2018  . IR PERC CHOLECYSTOSTOMY  07/19/2018  . IR RADIOLOGIST EVAL & MGMT  08/31/2018  . KNEE ARTHROSCOPY     left  . LOWER EXTREMITY ANGIOGRAM N/A 05/14/2014   Procedure: LOWER EXTREMITY ANGIOGRAM;  Surgeon: Lorretta Harp, MD;  Location: [facility];  Service: Cardiovascular;  Laterality: N/A;  . LOWER EXTREMITY ANGIOGRAPHY N/A 09/21/2016   Procedure: Lower Extremity Angiography;  Surgeon: Conrad Sibley, MD;  Location: [place] [facility];  Service: Cardiovascular;  Laterality: N/A;  . PERIPHERAL VASCULAR ATHERECTOMY Right 01/27/2017   Procedure: PERIPHERAL VASCULAR ATHERECTOMY;  Surgeon: Serafina Mitchell, MD;  Location: [place] [facility];  Service: Cardiovascular;  Laterality: Right;  . PERIPHERAL VASCULAR BALLOON ANGIOPLASTY Right 06/08/2017   Procedure: PERIPHERAL VASCULAR BALLOON ANGIOPLASTY;  Surgeon: Serafina Mitchell, MD;  Location: [place] [facility];  Service: Cardiovascular;  Laterality: Right;  common femoral and superficial femoral arteries  . PERIPHERAL VASCULAR CATHETERIZATION N/A 04/20/2016   Procedure: Lower Extremity Intervention;  Surgeon: Lorretta Harp, MD;  Location: [place] [facility];  Service: Cardiovascular;  Laterality: N/A;  . PERIPHERAL VASCULAR INTERVENTION  08/31/2017   Procedure: PERIPHERAL VASCULAR INTERVENTION;  Surgeon: Serafina Mitchell, MD;   Location: [place] [facility];  Service: Cardiovascular;;  REIA  . REVERSE SHOULDER ARTHROPLASTY Right 12/07/2016  . REVERSE SHOULDER ARTHROPLASTY Right 12/07/2016   Procedure: REVERSE SHOULDER ARTHROPLASTY;  Surgeon: Netta Cedars, MD;  Location: [place];  Service: Orthopedics;  Laterality: Right;  . ROTATOR CUFF REPAIR Right 2003  . SFA Right 05/14/2014   PTA  OF RT SFA         DR BERRY    Allergies: Asa [aspirin], Gadolinium derivatives, and Oxycodone-acetaminophen  Medications: Prior to Admission medications   Medication Sig Start Date End Date Taking? Authorizing Provider  amoxicillin-clavulanate (AUGMENTIN) 875-125 MG tablet Take 1 tablet by mouth every 12 (twelve) hours. Patient not taking: Reported on 09/12/2018 07/29/18   Ulyses Amor, PA-C  clopidogrel (PLAVIX) 75 MG tablet TAKE 1 TABLET DAILY 08/29/18   Lorretta Harp, MD  Coenzyme Q10 (COQ10 PO) Take 1 capsule by mouth every evening.     [provider]  docusate sodium (COLACE CLEAR) 50 MG capsule Take 50 mg by mouth 2 (two) times daily as needed for mild constipation.    [provider]  glucosamine-chondroitin 500-400 MG tablet Take 1 tablet by mouth every evening.     [provider]  lisinopril (PRINIVIL,ZESTRIL) 5  MG tablet Take 5 mg by mouth 2 (two) times daily.     [provider]  metFORMIN (GLUCOPHAGE) 1000 MG tablet Take 1 tablet (1,000 mg total) by mouth 2 (two) times daily with a meal. 05/16/14   Dunn, Dayna N, PA-C  metoprolol tartrate (LOPRESSOR) 25 MG tablet Take 1 tablet (25 mg total) by mouth 2 (two) times daily. 07/29/18   Ulyses Amor, PA-C  Omega-3 Fatty Acids (FISH OIL) 1200 MG CAPS Take 1,200 mg by mouth every evening.     [provider]  ondansetron (ZOFRAN ODT) 4 MG disintegrating tablet Take 1 tablet (4 mg total) by mouth every 8 (eight) hours as needed for nausea or vomiting. 08/02/18   Serafina Mitchell, MD  ondansetron (ZOFRAN) 4 MG tablet Take 1 tablet (4  mg total) by mouth daily as needed for nausea or vomiting. Patient not taking: Reported on 09/12/2018 08/02/18   Serafina Mitchell, MD  pantoprazole (PROTONIX) 40 MG tablet TAKE 1 TABLET TWICE A DAY 12/20/18   Lorretta Harp, MD  rosuvastatin (CRESTOR) 20 MG tablet Take 20 mg by mouth every evening.     [provider]  TOPROL XL 25 MG 24 hr tablet  08/08/18   [provider]  traMADol (ULTRAM) 50 MG tablet Take 1 tablet (50 mg total) by mouth every 6 (six) hours as needed for moderate pain. Patient not taking: Reported on 09/12/2018 08/08/18   Ulyses Amor, PA-C     Family History  Problem Relation Age of Onset  . Heart disease Mother   . Leukemia Mother   . Stomach cancer Father   . Esophageal cancer Brother   . Liver disease Brother   . Alcoholism Brother   . Leukemia Brother   . Leukemia Brother   . Diabetes type II Brother   . Lung disease Sister     Social History   Socioeconomic History  . Marital status: Married    Spouse name: Not on file  . Number of children: 2  . Years of education: Not on file  . Highest education level: Not on file  Occupational History  . Occupation: retired    Fish farm manager: RETIRED  Social Needs  . Financial resource strain: Not on file  . Food insecurity    Worry: Not on file    Inability: Not on file  . Transportation needs    Medical: Not on file    Non-medical: Not on file  Tobacco Use  . Smoking status: Former Smoker    Years: 25.00    Quit date: 06/1988    Years since quitting: 30.5  . Smokeless tobacco: Never Used  Substance and Sexual Activity  . Alcohol use: Yes    Comment: rarely  . Drug use: No  . Sexual activity: Not on file  Lifestyle  . Physical activity    Days per week: Not on file    Minutes per session: Not on file  . Stress: Not on file  Relationships  . Social Herbalist on phone: Not on file    Gets together: Not on file    Attends religious service: Not on file    Active member  of club or organization: Not on file    Attends meetings of clubs or organizations: Not on file    Relationship status: Not on file  Other Topics Concern  . Not on file  Social History Narrative  . Not on file  Vital Signs: BP (!) 103/55   Pulse 84   Temp 97.6 F (36.4 C)   SpO2 99%   Physical Exam  NAD, alert Abdomen: soft, non-tender, non-distended. Drain in place. Small amount of bilious output in collection bag. Insertion site intact.   Imaging: No results found.  Labs:  CBC: Recent Labs    07/28/18 0457 08/03/18 1220 08/04/18 0256 08/05/18 0802  WBC 9.9 11.3* 7.7 7.1  HGB 7.9* 9.0* 7.9* 8.9*  HCT 25.9* 29.3* 25.5* 27.9*  PLT 394 505* 374 397    COAGS: Recent Labs    07/01/18 0926 07/06/18 1600 08/03/18 1848  INR 1.04 1.25 1.05  APTT 30 27  --     BMP: Recent Labs    07/26/18 0500 07/27/18 0506 07/28/18 0457 08/03/18 1220 08/04/18 0256  NA 141 142 140 139  --   K 4.1 4.1 3.8 4.4  --   CL 106 109 105 103  --   CO2 24 25 26 25   --   GLUCOSE 126* 136* 112* 173*  --   BUN 37* 31* 27* 22  --   CALCIUM 7.7* 8.0* 7.9* 8.7*  --   CREATININE 1.60* 1.51* 1.50* 1.91* 1.55*  GFRNONAA 41* 44* 44* 33* 43*  GFRAA 47* 51* 51* 38* 49*    LIVER FUNCTION TESTS: Recent Labs    07/18/18 0437 07/21/18 0300 07/25/18 0417 08/03/18 1220  BILITOT 1.0 0.6 0.7 0.5  AST 31 55* 23 33  ALT 16 33 23 30  ALKPHOS 86 118 108 104  PROT 5.3* 5.7* 6.0* 7.0  ALBUMIN 2.0* 1.8* 1.8* 2.4*    Assessment: Cholecystitis s/p cholecystostomy tube placement 07/19/18 Patient presents today for evaluation of his cholecystostomy tube.  He was last seen in IR clinic in March at which time he was planning to undergo surgical removal of his gall bladder. In the interim, he has been feeling better and upon reassessment with his surgeon is considering alternative management options including capping trial vs. Drain removal.  Drain injection performed today and reviewed by Dr.  Pascal Lux; results dictated separately.  Drain to remain in place for now.  Patient to be scheduled for drain [facility] this week. At that time, he may be considered for drain capping trial.  Final disposition of tube to be determined based on surgical plans.   Signed: Docia Barrier, PA 01/17/2019, 2:36 PM   Please refer to Dr. Pascal Lux attestation of this note for management and plan.

## 2019-01-18 ENCOUNTER — Other Ambulatory Visit: Payer: Self-pay

## 2019-01-18 ENCOUNTER — Emergency Department (HOSPITAL_COMMUNITY): Payer: Medicare Other

## 2019-01-18 ENCOUNTER — Emergency Department (HOSPITAL_COMMUNITY)
Admission: EM | Admit: 2019-01-18 | Discharge: 2019-01-18 | Disposition: A | Discharge status: Home / Self Care | Payer: Medicare Other | Source: Home / Self Care | Attending: Emergency Medicine | Admitting: Emergency Medicine

## 2019-01-18 DIAGNOSIS — Z7902 Long term (current) use of antithrombotics/antiplatelets: Secondary | ICD-10-CM | POA: Insufficient documentation

## 2019-01-18 DIAGNOSIS — Z79899 Other long term (current) drug therapy: Secondary | ICD-10-CM | POA: Insufficient documentation

## 2019-01-18 DIAGNOSIS — K812 Acute cholecystitis with chronic cholecystitis: Secondary | ICD-10-CM | POA: Diagnosis not present

## 2019-01-18 DIAGNOSIS — Z955 Presence of coronary angioplasty implant and graft: Secondary | ICD-10-CM | POA: Insufficient documentation

## 2019-01-18 DIAGNOSIS — Z934 Other artificial openings of gastrointestinal tract status: Secondary | ICD-10-CM | POA: Diagnosis not present

## 2019-01-18 DIAGNOSIS — A4159 Other Gram-negative sepsis: Secondary | ICD-10-CM | POA: Diagnosis not present

## 2019-01-18 DIAGNOSIS — E1122 Type 2 diabetes mellitus with diabetic chronic kidney disease: Secondary | ICD-10-CM | POA: Diagnosis not present

## 2019-01-18 DIAGNOSIS — R509 Fever, unspecified: Secondary | ICD-10-CM | POA: Insufficient documentation

## 2019-01-18 DIAGNOSIS — I129 Hypertensive chronic kidney disease with stage 1 through stage 4 chronic kidney disease, or unspecified chronic kidney disease: Secondary | ICD-10-CM | POA: Diagnosis not present

## 2019-01-18 DIAGNOSIS — Z87891 Personal history of nicotine dependence: Secondary | ICD-10-CM | POA: Insufficient documentation

## 2019-01-18 DIAGNOSIS — R1084 Generalized abdominal pain: Secondary | ICD-10-CM

## 2019-01-18 DIAGNOSIS — A4151 Sepsis due to Escherichia coli [E. coli]: Secondary | ICD-10-CM | POA: Diagnosis not present

## 2019-01-18 DIAGNOSIS — T8579XA Infection and inflammatory reaction due to other internal prosthetic devices, implants and grafts, initial encounter: Secondary | ICD-10-CM | POA: Diagnosis not present

## 2019-01-18 DIAGNOSIS — R109 Unspecified abdominal pain: Secondary | ICD-10-CM | POA: Diagnosis not present

## 2019-01-18 DIAGNOSIS — Z7984 Long term (current) use of oral hypoglycemic drugs: Secondary | ICD-10-CM | POA: Insufficient documentation

## 2019-01-18 DIAGNOSIS — Z20828 Contact with and (suspected) exposure to other viral communicable diseases: Secondary | ICD-10-CM | POA: Insufficient documentation

## 2019-01-18 DIAGNOSIS — I10 Essential (primary) hypertension: Secondary | ICD-10-CM | POA: Insufficient documentation

## 2019-01-18 DIAGNOSIS — E119 Type 2 diabetes mellitus without complications: Secondary | ICD-10-CM | POA: Insufficient documentation

## 2019-01-18 LAB — COMPREHENSIVE METABOLIC PANEL
ALT: 17 U/L (ref 0–44)
AST: 26 U/L (ref 15–41)
Albumin: 3.7 g/dL (ref 3.5–5.0)
Alkaline Phosphatase: 90 U/L (ref 38–126)
Anion gap: 12 (ref 5–15)
BUN: 34 mg/dL — ABNORMAL HIGH (ref 8–23)
CO2: 22 mmol/L (ref 22–32)
Calcium: 9 mg/dL (ref 8.9–10.3)
Chloride: 100 mmol/L (ref 98–111)
Creatinine, Ser: 1.93 mg/dL — ABNORMAL HIGH (ref 0.61–1.24)
GFR calc Af Amer: 38 mL/min — ABNORMAL LOW (ref >60)
GFR calc non Af Amer: 33 mL/min — ABNORMAL LOW (ref >60)
Glucose, Bld: 185 mg/dL — ABNORMAL HIGH (ref 70–99)
Potassium: 5.6 mmol/L — ABNORMAL HIGH (ref 3.5–5.1)
Sodium: 134 mmol/L — ABNORMAL LOW (ref 135–145)
Total Bilirubin: 0.7 mg/dL (ref 0.3–1.2)
Total Protein: 6.8 g/dL (ref 6.5–8.1)

## 2019-01-18 LAB — CBC
HCT: 33.4 % — ABNORMAL LOW (ref 39.0–52.0)
Hemoglobin: 10.7 g/dL — ABNORMAL LOW (ref 13.0–17.0)
MCH: 29.6 pg (ref 26.0–34.0)
MCHC: 32 g/dL (ref 30.0–36.0)
MCV: 92.5 fL (ref 80.0–100.0)
Platelets: 217 10*3/uL (ref 150–400)
RBC: 3.61 MIL/uL — ABNORMAL LOW (ref 4.22–5.81)
RDW: 13.4 % (ref 11.5–15.5)
WBC: 13.9 10*3/uL — ABNORMAL HIGH (ref 4.0–10.5)
nRBC: 0 % (ref 0.0–0.2)

## 2019-01-18 LAB — URINALYSIS, ROUTINE W REFLEX MICROSCOPIC
Bacteria, UA: NONE SEEN
Bilirubin Urine: NEGATIVE
Glucose, UA: NEGATIVE mg/dL
Ketones, ur: NEGATIVE mg/dL
Leukocytes,Ua: NEGATIVE
Nitrite: NEGATIVE
Protein, ur: NEGATIVE mg/dL
Specific Gravity, Urine: 1.012 (ref 1.005–1.030)
pH: 5 (ref 5.0–8.0)

## 2019-01-18 LAB — I-STAT CREATININE, ED: Creatinine, Ser: 1.8 mg/dL — ABNORMAL HIGH (ref 0.61–1.24)

## 2019-01-18 LAB — SARS CORONAVIRUS 2 BY RT PCR (HOSPITAL ORDER, PERFORMED IN ~~LOC~~ HOSPITAL LAB): SARS Coronavirus 2: NEGATIVE

## 2019-01-18 LAB — LIPASE, BLOOD: Lipase: 25 U/L (ref 11–51)

## 2019-01-18 MED ORDER — ACETAMINOPHEN 500 MG PO TABS
1000.0000 mg | ORAL_TABLET | Freq: Once | ORAL | Status: AC
  Administered 2019-01-18: 1000 mg via ORAL
  Filled 2019-01-18: qty 2

## 2019-01-18 MED ORDER — IOHEXOL 300 MG/ML  SOLN
100.0000 mL | Freq: Once | INTRAMUSCULAR | Status: AC | PRN
  Administered 2019-01-18: 20:00:00 100 mL via INTRAVENOUS

## 2019-01-18 MED ORDER — SODIUM CHLORIDE 0.9% FLUSH: 3.0000 mL | Freq: Once | INTRAVENOUS | Status: DC

## 2019-01-18 MED ORDER — SODIUM CHLORIDE 0.9 % IV BOLUS
1000.0000 mL | Freq: Once | INTRAVENOUS | Status: AC
  Administered 2019-01-18: 1000 mL via INTRAVENOUS

## 2019-01-18 MED ORDER — SODIUM CHLORIDE 0.9 % IV BOLUS: 1000.0000 mL | Freq: Once | INTRAVENOUS | Status: DC

## 2019-01-18 NOTE — ED Notes (Signed)
Patient transported to CT 

## 2019-01-18 NOTE — Discharge Instructions (Signed)
I am not sure of the cause of your fever.  Please return to the ED at any point you would like to be reevaluated or if you feel like you rather stay in the hospital.  Also return for worsening abdominal pain inability to eat or drink.  He can take Tylenol for fever.  Let the IR physicians know that you had a fever post the procedure and see if they are still willing to have your tube removed on Friday.

## 2019-01-18 NOTE — ED Provider Notes (Signed)
[facility] EMERGENCY DEPARTMENT Provider Note   CSN: [id number] Arrival date & time: 01/18/19  1715    History   Chief Complaint Chief Complaint  Patient presents with   Abdominal Pain    HPI Christian Bailey is a 78 y.o. male.     78 yo M with a chief complaints of right-sided abdominal pain.  The patient had his gallbladder to evaluated yesterday in the IR suite.  Seem to be in good position and he was discharged home.  About 3 to 4 hours later he started having some severe right-sided abdominal pain.  Started having fevers and nausea as well.  The pain seems to have improved but continue to fevers this morning.  Decided to come into the ED for evaluation.  Had this drain placed for acute cholecystitis back in January.  There are plans to have this exchanged next week.   Abdominal Pain Pain location:  RLQ and RUQ Pain quality: squeezing and stabbing   Pain radiates to:  Does not radiate Pain severity:  Moderate Onset quality:  Gradual Duration:  1 day Timing:  Constant Progression:  Partially resolved Chronicity:  New Relieved by:  Nothing Worsened by:  Nothing Ineffective treatments:  None tried Associated symptoms: fever   Associated symptoms: no chest pain, no chills, no diarrhea, no shortness of breath and no vomiting     Past Medical History:  Diagnosis Date   Anemia    Complication of anesthesia    Diabetes mellitus (HCC)    TYPE 2   GERD (gastroesophageal reflux disease)    History of blood transfusion    GI bleed   History of colon polyps    History of hiatal hernia    Hyperlipemia    Hypertension    Osteoarthritis    PAD (peripheral artery disease) (HCC)    a. stenting of his left common iliac artery >20 years ago. b. h/o LEIA stent and 2 stents to R SFA in 2011. c. 04/2014:  s/p PTA of right SFA for in-stent restenosis, occluded left SFA   PONV (postoperative nausea and vomiting)    no porblem with the last 3 surgeries     RBBB (right bundle branch block with left anterior fascicular block)    NUCLEAR STRESS TEST, 08/18/2010 - no significant wall motion abnoramlities noted, post-stress EF 69%, normal myocardial perfusion study   Sinus tachycardia    a. Noted during admission 04/2014 but upon review seems to be frequent finding for patient.   Stenosis of carotid artery    a. 50% right carotid stenosis by angiogram 04/2014.    Patient Active Problem List   Diagnosis Date Noted   Stenosis of infrarenal abdominal aorta due to atherosclerosis ([place]) 07/06/2018   S/P shoulder replacement, right 12/07/2016   PVD (peripheral vascular disease) ([place]) 09/18/2016   Sinus tachycardia 05/15/2014   Carotid artery disease ([place]) 04/26/2014   PAD (peripheral artery disease) ([place]) 06/12/2013   RBBB (right bundle branch block with left anterior fascicular block) 06/12/2013   Claudication ([place]) 12/14/2012   Essential hypertension 12/14/2012   Hyperlipidemia 12/14/2012   Type 2 diabetes mellitus ([place]) 12/14/2012   Personal history of colonic polyps 01/25/2012   DM 08/11/2010   DUODENAL ULCER, ACUTE, HEMORRHAGE 08/11/2010    Past Surgical History:  Procedure Laterality Date   ABDOMINAL AORTOGRAM W/LOWER EXTREMITY N/A 01/27/2017   Procedure: Abdominal Aortogram w/Lower Extremity;  Surgeon: Serafina Mitchell, MD;  Location: [place] [facility];  Service: Cardiovascular;  Laterality: N/A;   ABDOMINAL AORTOGRAM W/LOWER EXTREMITY N/A 06/08/2017   Procedure: ABDOMINAL AORTOGRAM W/LOWER EXTREMITY;  Surgeon: Serafina Mitchell, MD;  Location: [place] [facility];  Service: Cardiovascular;  Laterality: N/A;   ABDOMINAL AORTOGRAM W/LOWER EXTREMITY N/A 08/31/2017   Procedure: ABDOMINAL AORTOGRAM W/LOWER EXTREMITY;  Surgeon: Serafina Mitchell, MD;  Location: [place] [facility];  Service: Cardiovascular;  Laterality: N/A;  rt. unilateral   ANGIOPLASTY / STENTING FEMORAL     ANGIOPLASTY / STENTING ILIAC     AORTA -  BILATERAL FEMORAL ARTERY BYPASS GRAFT N/A 07/06/2018   Procedure: AORTA BIFEMORAL BYPASS GRAFT USING 14X7MM X 40CM HEMASHIELD GOLD GRAFT;  Surgeon: Serafina Mitchell, MD;  Location: [place];  Service: Vascular;  Laterality: N/A;   CEREBRAL ANGIOGRAM N/A 05/14/2014   Procedure: CEREBRAL ANGIOGRAM;  Surgeon: Lorretta Harp, MD;  Location: [facility] CATH LAB;  Service: Cardiovascular;  Laterality: N/A;   COLONOSCOPY W/ POLYPECTOMY     ENDARTERECTOMY Right 09/08/2017   ENDARTERECTOMY FEMORAL Right 09/08/2017   Procedure: REDO RIGHT FEMORAL ENDARTECTOMY WITH PATCH ANGIOPLASTY.;  Surgeon: Serafina Mitchell, MD;  Location: [facility];  Service: Vascular;  Laterality: Right;   EYE SURGERY Bilateral    cataract   FEMORAL ARTERY STENT Right 05/12/2010   Stented distally with a 6x100 Abbott absolute stent and proximally with a 6x60 Cook Zilver stent resulting in the reduction of the proximal segment 80% and mid segment 60-70% to 0% residual, LEFT common femoral artery stented with a 7x3 Smart stent resulting in reduction of 90% stenosis to 0% residual   FEMORAL-POPLITEAL BYPASS GRAFT Right 09/18/2016   Procedure: BYPASS GRAFT FEMORAL-POPLITEAL ARTERY;  Surgeon: Serafina Mitchell, MD;  Location: [place];  Service: Vascular;  Laterality: Right;   FEMORAL-POPLITEAL BYPASS GRAFT Right 09/08/2017   Procedure: REDO BYPASS GRAFT FEMORAL-POPLITEAL ARTERY;  Surgeon: Serafina Mitchell, MD;  Location: [facility];  Service: Vascular;  Laterality: Right;   FEMORAL-POPLITEAL BYPASS GRAFT Right 07/06/2018   Procedure: REVISION RIGHT FEMORAL TO POPLITEAL ARTERY BYPASS GRAFT;  Surgeon: Serafina Mitchell, MD;  Location: [place];  Service: Vascular;  Laterality: Right;   IR CHOLANGIOGRAM EXISTING TUBE  08/04/2018   IR PERC CHOLECYSTOSTOMY  07/19/2018   IR RADIOLOGIST EVAL & MGMT  08/31/2018   IR RADIOLOGIST EVAL & MGMT  01/17/2019   KNEE ARTHROSCOPY     left   LOWER EXTREMITY ANGIOGRAM N/A 05/14/2014   Procedure: LOWER EXTREMITY ANGIOGRAM;   Surgeon: Lorretta Harp, MD;  Location: [facility] CATH LAB;  Service: Cardiovascular;  Laterality: N/A;   LOWER EXTREMITY ANGIOGRAPHY N/A 09/21/2016   Procedure: Lower Extremity Angiography;  Surgeon: Conrad Ogdensburg, MD;  Location: [place] [facility];  Service: Cardiovascular;  Laterality: N/A;   PERIPHERAL VASCULAR ATHERECTOMY Right 01/27/2017   Procedure: PERIPHERAL VASCULAR ATHERECTOMY;  Surgeon: Serafina Mitchell, MD;  Location: [place] [facility];  Service: Cardiovascular;  Laterality: Right;   PERIPHERAL VASCULAR BALLOON ANGIOPLASTY Right 06/08/2017   Procedure: PERIPHERAL VASCULAR BALLOON ANGIOPLASTY;  Surgeon: Serafina Mitchell, MD;  Location: [place] [facility];  Service: Cardiovascular;  Laterality: Right;  common femoral and superficial femoral arteries   PERIPHERAL VASCULAR CATHETERIZATION N/A 04/20/2016   Procedure: Lower Extremity Intervention;  Surgeon: Lorretta Harp, MD;  Location: [place] [facility];  Service: Cardiovascular;  Laterality: N/A;   PERIPHERAL VASCULAR INTERVENTION  08/31/2017   Procedure: PERIPHERAL VASCULAR INTERVENTION;  Surgeon: Serafina Mitchell, MD;  Location: [place] [facility];  Service: Cardiovascular;;  REIA   REVERSE SHOULDER ARTHROPLASTY Right 12/07/2016   REVERSE SHOULDER ARTHROPLASTY Right 12/07/2016   Procedure: REVERSE SHOULDER ARTHROPLASTY;  Surgeon: Netta Cedars, MD;  Location: [place];  Service: Orthopedics;  Laterality: Right;   ROTATOR CUFF REPAIR Right 2003   SFA Right 05/14/2014   PTA  OF RT SFA         DR BERRY        Home Medications    Prior to Admission medications   Medication Sig Start Date End Date Taking? Authorizing Provider  clopidogrel (PLAVIX) 75 MG tablet TAKE 1 TABLET DAILY Patient taking differently: Take 75 mg by mouth daily.  08/29/18  Yes Lorretta Harp, MD  lisinopril (PRINIVIL,ZESTRIL) 5 MG tablet Take 5 mg by mouth 2 (two) times daily.    Yes [provider]  metFORMIN (GLUCOPHAGE) 1000 MG tablet Take 1 tablet  (1,000 mg total) by mouth 2 (two) times daily with a meal. 05/16/14  Yes Dunn, Dayna N, PA-C  metoprolol tartrate (LOPRESSOR) 25 MG tablet Take 1 tablet (25 mg total) by mouth 2 (two) times daily. 07/29/18  Yes Laurence Slate M, PA-C  pantoprazole (PROTONIX) 40 MG tablet TAKE 1 TABLET TWICE A DAY Patient taking differently: Take 40 mg by mouth 2 (two) times daily.  12/20/18  Yes Lorretta Harp, MD  rosuvastatin (CRESTOR) 20 MG tablet Take 20 mg by mouth every evening.    Yes [provider]    Family History Family History  Problem Relation Age of Onset   Heart disease Mother    Leukemia Mother    Stomach cancer Father    Esophageal cancer Brother    Liver disease Brother    Alcoholism Brother    Leukemia Brother    Leukemia Brother    Diabetes type II Brother    Lung disease Sister     Social History Social History   Tobacco Use   Smoking status: Former Smoker    Years: 25.00    Quit date: 06/1988    Years since quitting: 30.5   Smokeless tobacco: Never Used  Substance Use Topics   Alcohol use: Yes    Comment: rarely   Drug use: No     Allergies   Asa [aspirin], Gadolinium derivatives, and Oxycodone-acetaminophen   Review of Systems Review of Systems  Constitutional: Positive for fever. Negative for chills.  HENT: Negative for congestion and facial swelling.   Eyes: Negative for discharge and visual disturbance.  Respiratory: Negative for shortness of breath.   Cardiovascular: Negative for chest pain and palpitations.  Gastrointestinal: Positive for abdominal pain. Negative for diarrhea and vomiting.  Musculoskeletal: Negative for arthralgias and myalgias.  Skin: Negative for color change and rash.  Neurological: Negative for tremors, syncope and headaches.  Psychiatric/Behavioral: Negative for confusion and dysphoric mood.     Physical Exam Updated Vital Signs BP 116/68    Pulse (!) 125    Temp (!) 100.6 F (38.1 C) (Oral)    Resp  17    SpO2 97%   Physical Exam Vitals signs and nursing note reviewed.  Constitutional:      Appearance: He is well-developed.  HENT:     Head: Normocephalic and atraumatic.  Eyes:     Pupils: Pupils are equal, round, and reactive to light.  Neck:     Musculoskeletal: Normal range of motion and neck supple.     Vascular: No JVD.  Cardiovascular:     Rate and Rhythm: Normal rate and regular  rhythm.     Heart sounds: No murmur. No friction rub. No gallop.   Pulmonary:     Effort: No respiratory distress.     Breath sounds: No wheezing.  Abdominal:     General: There is no distension.     Tenderness: There is no abdominal tenderness. There is no guarding or rebound.     Comments: No appreciable right-sided abdominal pain.  He has a drain in place in the right upper quadrant.  There is no erythema or drainage to the skin site.  Seems to be draining into the bag without issue.  Musculoskeletal: Normal range of motion.  Skin:    Coloration: Skin is not pale.     Findings: No rash.  Neurological:     Mental Status: He is alert and oriented to person, place, and time.  Psychiatric:        Behavior: Behavior normal.      ED Treatments / Results  Labs (all labs ordered are listed, but only abnormal results are displayed) Labs Reviewed  COMPREHENSIVE METABOLIC PANEL - Abnormal; Notable for the following components:      Result Value   Sodium 134 (*)    Potassium 5.6 (*)    Glucose, Bld 185 (*)    BUN 34 (*)    Creatinine, Ser 1.93 (*)    GFR calc non Af Amer 33 (*)    GFR calc Af Amer 38 (*)    All other components within normal limits  CBC - Abnormal; Notable for the following components:   WBC 13.9 (*)    RBC 3.61 (*)    Hemoglobin 10.7 (*)    HCT 33.4 (*)    All other components within normal limits  URINALYSIS, ROUTINE W REFLEX MICROSCOPIC - Abnormal; Notable for the following components:   Hgb urine dipstick SMALL (*)    All other components within normal limits    I-STAT CREATININE, ED - Abnormal; Notable for the following components:   Creatinine, Ser 1.80 (*)    All other components within normal limits  SARS CORONAVIRUS 2 (HOSPITAL ORDER, [place] [facility])  CULTURE, BLOOD (ROUTINE X 2)  CULTURE, BLOOD (ROUTINE X 2)  LIPASE, BLOOD    EKG EKG Interpretation  Date/Time:  Wednesday January 18 2019 19:06:46 EDT Ventricular Rate:  130 PR Interval:    QRS Duration: 135 QT Interval:  332 QTC Calculation: 489 R Axis:   -112 Text Interpretation:  Sinus tachycardia Atrial premature complex RBBB and LAFB ST changes likely rate related Otherwise no significant change Confirmed by Deno Etienne [phone]) on 01/18/2019 7:33:01 PM   Radiology Ct Abdomen Pelvis W Contrast  Result Date: 01/18/2019 CLINICAL DATA:  Abdominal pain and fevers, known cholecystostomy tube EXAM: CT ABDOMEN AND PELVIS WITH CONTRAST TECHNIQUE: Multidetector CT imaging of the abdomen and pelvis was performed using the standard protocol following bolus administration of intravenous contrast. CONTRAST:  149mL OMNIPAQUE IOHEXOL 300 MG/ML  SOLN COMPARISON:  08/03/2018 FINDINGS: Lower chest: No acute abnormality. Hepatobiliary: Scattered cysts are noted within the liver stable from the prior exam. The gallbladder is decompressed by cholecystostomy tube which is redundant within the gallbladder. No significant dilatation of the gallbladder is seen to suggest tube obstruction. No leakage of fluid is identified. No biliary ductal dilatation is seen. Pancreas: Unremarkable. No pancreatic ductal dilatation or surrounding inflammatory changes. Spleen: Normal in size without focal abnormality. Adrenals/Urinary Tract: Adrenal glands are within normal limits. Kidneys are well visualized bilaterally. Some scarring  in the lower pole of the left kidney is again noted and stable. Renal cysts are seen bilaterally. A nonobstructing right renal stone is noted. No ureteral calculi are seen. The  bladder is partially distended. Stomach/Bowel: No obstructive or inflammatory changes of the colon are seen. The appendix is air-filled and within normal limits. No small bowel or gastric abnormality is noted. Vascular/Lymphatic: There are changes consistent with prior aortic bypass. The bypass graft is patent bilaterally previously seen perigraft seromas have resolved in the interval. No significant lymphadenopathy is noted. Reproductive: Prostate is unremarkable. Other: No abdominal wall hernia or abnormality. No abdominopelvic ascites. Musculoskeletal: Degenerative changes of lumbar spine are noted stable from the prior exam. IMPRESSION: Cholecystostomy tube in satisfactory position. No pericholecystic fluid is noted. No findings to suggest tube obstruction are seen. Chronic changes similar to that seen on the prior exam. Resolution of perigraft seromas surrounding the known aortobifemoral bypass graft. Electronically Signed   By: Inez Catalina M.D.   On: 01/18/2019 20:30   Dg Chest Port 1 View  Result Date: 01/18/2019 CLINICAL DATA:  78 year old male with fever. EXAM: PORTABLE CHEST 1 VIEW COMPARISON:  Chest radiograph dated 08/03/2018 FINDINGS: There is no focal consolidation, pleural effusion, or pneumothorax. The cardiac silhouette is within normal limits. No acute osseous pathology. Right shoulder arthroplasty. Right upper quadrant percutaneous pigtail cholecystostomy. IMPRESSION: No active disease. Electronically Signed   By: Anner Crete M.D.   On: 01/18/2019 20:17   Dg Cholangiogram  Existing Tube  Result Date: 01/17/2019 INDICATION: History of acute cholecystitis, post ultrasound fluoroscopic guided cholecystostomy tube placement on 07/19/2018. Patient subsequent underwent percutaneous drainage catheter injection on 08/04/2018 and again on 08/31/2018 however the original cholecystostomy tube has not been exchanged since the time of initial placement. Patient presents today for percutaneous  cholangiogram via existing cholecystostomy tube. EXAM: FLUOROSCOPIC GUIDED CHOLECYSTOSTOMY TUBE INJECTION COMPARISON:  Image guided cholecystostomy tube placement 07/19/2018 cholangiogram via existing cholecystostomy tube-08/31/2018; 08/04/2018; CT abdomen pelvis-08/03/2018 MEDICATIONS: None ANESTHESIA/SEDATION: None CONTRAST:  20 cc Omnipaque 300-administered into the gallbladder fossa. FLUOROSCOPY TIME:  1 minutes, 18 seconds (52 mGy) COMPLICATIONS: None immediate. PROCEDURE: The patient was positioned supine on the fluoroscopy table. A preprocedural spot fluoroscopic image was obtained of the right upper abdominal quadrant existing cholecystostomy tube. Multiple spot fluoroscopic radiographic images were obtained of the right upper abdominal quadrant an existing cholecystostomy tube following injection of a small amount of contrast. Images were reviewed and discussed with the patient. The cholecystostomy tube was flushed with a small amount of saline and capped. A dressing was placed. The patient tolerated the procedure well without immediate postprocedural complication. FINDINGS: Preprocedural spot fluoroscopic image of the right upper abdominal quadrant demonstrates grossly unchanged positioning of the cholecystostomy tube with end coiled and locked overlying the expected location of the gallbladder fundus. Subsequent contrast injection demonstrates appropriate functionality of the cholecystostomy tube with brisk opacification of the gallbladder. There is passage of contrast from the gallbladder through the cystic and common bile ducts the level duodenum. No definitive intraluminal filling defect to suggest the presence of cholelithiasis or choledocholithiasis. IMPRESSION: 1. Appropriately positioned and functioning cholecystostomy tube. 2. No evidence of cholelithiasis or choledocholithiasis. PLAN: - The patient will be scheduled for routine exchange of the cholecystostomy tube later this week as the tube has  not been exchanged since the time of initial placement on 07/19/2018. - if the exchange is uneventful, the cholecystostomy tube will be capped for a trial of internalization. - Assuming the patient passes his trial of  internalization/capping, he will return to the interventional radiology drain Clinic 1 week following the exchange/capping for repeat cholangiogram and potential cholecystostomy tube removal. Above discussed and agreed upon with referring surgeon, Dr. Dema Severin. Electronically Signed   By: Sandi Mariscal M.D.   On: 01/17/2019 15:42   Ir Radiologist Eval & Mgmt  Result Date: 01/17/2019 Please refer to notes tab for details about interventional procedure. (Op Note)   Procedures Procedures (including critical care time)  Medications Ordered in ED Medications  sodium chloride flush (NS) 0.9 % injection 3 mL (3 mLs Intravenous Not Given 01/18/19 1815)  sodium chloride 0.9 % bolus 1,000 mL (has no administration in time range)  sodium chloride 0.9 % bolus 1,000 mL (0 mLs Intravenous Stopped 01/18/19 2013)  acetaminophen (TYLENOL) tablet 1,000 mg (1,000 mg Oral Given 01/18/19 1848)  iohexol (OMNIPAQUE) 300 MG/ML solution 100 mL (100 mLs Intravenous Contrast Given 01/18/19 1944)     Initial Impression / Assessment and Plan / ED Course  I have reviewed the triage vital signs and the nursing notes.  Pertinent labs & imaging results that were available during my care of the patient were reviewed by me and considered in my medical decision making (see chart for details).        78 yo M with a chief complaints of right-sided abdominal pain after having a imaging study of his percutaneous gallbladder tube.  Lab work is not consistent with choledocholithiasis or pancreatitis.  He has a very mild leukocytosis.  CT scan of the abdomen pelvis with the tube in appropriate position there is no signs of acute cholecystitis there is no other significant finding in the abdomen pelvis.  Patient is feeling  much better on reassessment.  He states he has no pain at all.  Would like to go home.  I discussed with him that he still has a heart rate in the 120s.  He told me that he typically has heart rates that are higher than normal.  When back at his old records his heart rate seems more consistently down in the low 100s to 90s.  Patient would still like to go home he was able to ambulate without difficulty.  States that he is feeling well.  Still me he will call his doctor tomorrow.  However return for any worsening symptoms.  9:48 PM:  I have discussed the diagnosis/risks/treatment options with the patient and family and believe the pt to be eligible for discharge home to follow-up with PCP. We also discussed returning to the ED immediately if new or worsening sx occur. We discussed the sx which are most concerning (e.g., sudden worsening pain,  inability to tolerate by mouth) that necessitate immediate return. Medications administered to the patient during their visit and any new prescriptions provided to the patient are listed below.  Medications given during this visit Medications  sodium chloride flush (NS) 0.9 % injection 3 mL (3 mLs Intravenous Not Given 01/18/19 1815)  sodium chloride 0.9 % bolus 1,000 mL (has no administration in time range)  sodium chloride 0.9 % bolus 1,000 mL (0 mLs Intravenous Stopped 01/18/19 2013)  acetaminophen (TYLENOL) tablet 1,000 mg (1,000 mg Oral Given 01/18/19 1848)  iohexol (OMNIPAQUE) 300 MG/ML solution 100 mL (100 mLs Intravenous Contrast Given 01/18/19 1944)     The patient appears reasonably screen and/or stabilized for discharge and I doubt any other medical condition or other [facility] requiring further screening, evaluation, or treatment in the ED at this time prior to discharge.  Final Clinical Impressions(s) / ED Diagnoses   Final diagnoses:  Generalized abdominal pain  Fever in adult    ED Discharge Orders    None       Deno Etienne, DO 01/18/19  2148

## 2019-01-18 NOTE — ED Triage Notes (Signed)
Pt reports he had a gallbladder diagnostic procedure yesterday. Pt reports developing abdominal pain. Pt reports fever yesterday and today. Pt took advil at 9 this am. Pt reports a little nausea, no vomiting. Pt denies cough, SHOB

## 2019-01-18 NOTE — ED Notes (Signed)
Patient verbalizes understanding of discharge instructions. Opportunity for questioning and answers were provided. Armband removed by staff, pt discharged from ED.  

## 2019-01-18 NOTE — ED Notes (Signed)
Notified EDP pt bp is soft.

## 2019-01-18 NOTE — ED Notes (Signed)
Informed Elizabeth - RN of pt's Creatinine result.

## 2019-01-19 ENCOUNTER — Telehealth (HOSPITAL_COMMUNITY): Payer: Self-pay

## 2019-01-19 ENCOUNTER — Telehealth (HOSPITAL_BASED_OUTPATIENT_CLINIC_OR_DEPARTMENT_OTHER): Payer: Self-pay | Admitting: Emergency Medicine

## 2019-01-19 ENCOUNTER — Other Ambulatory Visit: Payer: Self-pay

## 2019-01-19 ENCOUNTER — Inpatient Hospital Stay (HOSPITAL_COMMUNITY)
Admission: EM | Admit: 2019-01-19 | Discharge: 2019-01-24 | DRG: 919 | Disposition: A | Discharge status: Home / Self Care | Payer: Medicare Other | Attending: Internal Medicine | Admitting: Internal Medicine

## 2019-01-19 DIAGNOSIS — N183 Chronic kidney disease, stage 3 unspecified: Secondary | ICD-10-CM | POA: Diagnosis present

## 2019-01-19 DIAGNOSIS — Z7902 Long term (current) use of antithrombotics/antiplatelets: Secondary | ICD-10-CM

## 2019-01-19 DIAGNOSIS — K219 Gastro-esophageal reflux disease without esophagitis: Secondary | ICD-10-CM | POA: Diagnosis present

## 2019-01-19 DIAGNOSIS — T8579XA Infection and inflammatory reaction due to other internal prosthetic devices, implants and grafts, initial encounter: Principal | ICD-10-CM | POA: Diagnosis present

## 2019-01-19 DIAGNOSIS — K812 Acute cholecystitis with chronic cholecystitis: Secondary | ICD-10-CM | POA: Diagnosis present

## 2019-01-19 DIAGNOSIS — Y848 Other medical procedures as the cause of abnormal reaction of the patient, or of later complication, without mention of misadventure at the time of the procedure: Secondary | ICD-10-CM | POA: Diagnosis present

## 2019-01-19 DIAGNOSIS — E1151 Type 2 diabetes mellitus with diabetic peripheral angiopathy without gangrene: Secondary | ICD-10-CM | POA: Diagnosis present

## 2019-01-19 DIAGNOSIS — I129 Hypertensive chronic kidney disease with stage 1 through stage 4 chronic kidney disease, or unspecified chronic kidney disease: Secondary | ICD-10-CM | POA: Diagnosis present

## 2019-01-19 DIAGNOSIS — E785 Hyperlipidemia, unspecified: Secondary | ICD-10-CM | POA: Diagnosis present

## 2019-01-19 DIAGNOSIS — Z7984 Long term (current) use of oral hypoglycemic drugs: Secondary | ICD-10-CM | POA: Diagnosis not present

## 2019-01-19 DIAGNOSIS — A4159 Other Gram-negative sepsis: Secondary | ICD-10-CM | POA: Diagnosis not present

## 2019-01-19 DIAGNOSIS — Z833 Family history of diabetes mellitus: Secondary | ICD-10-CM | POA: Diagnosis not present

## 2019-01-19 DIAGNOSIS — Z79899 Other long term (current) drug therapy: Secondary | ICD-10-CM | POA: Diagnosis not present

## 2019-01-19 DIAGNOSIS — I10 Essential (primary) hypertension: Secondary | ICD-10-CM | POA: Diagnosis present

## 2019-01-19 DIAGNOSIS — K8011 Calculus of gallbladder with chronic cholecystitis with obstruction: Secondary | ICD-10-CM | POA: Diagnosis not present

## 2019-01-19 DIAGNOSIS — K810 Acute cholecystitis: Secondary | ICD-10-CM | POA: Diagnosis not present

## 2019-01-19 DIAGNOSIS — Z87891 Personal history of nicotine dependence: Secondary | ICD-10-CM

## 2019-01-19 DIAGNOSIS — Z96611 Presence of right artificial shoulder joint: Secondary | ICD-10-CM | POA: Diagnosis present

## 2019-01-19 DIAGNOSIS — Z20828 Contact with and (suspected) exposure to other viral communicable diseases: Secondary | ICD-10-CM | POA: Diagnosis present

## 2019-01-19 DIAGNOSIS — E119 Type 2 diabetes mellitus without complications: Secondary | ICD-10-CM | POA: Diagnosis not present

## 2019-01-19 DIAGNOSIS — A4151 Sepsis due to Escherichia coli [E. coli]: Secondary | ICD-10-CM | POA: Diagnosis present

## 2019-01-19 DIAGNOSIS — E1122 Type 2 diabetes mellitus with diabetic chronic kidney disease: Secondary | ICD-10-CM | POA: Diagnosis present

## 2019-01-19 DIAGNOSIS — R7881 Bacteremia: Secondary | ICD-10-CM | POA: Diagnosis present

## 2019-01-19 LAB — COMPREHENSIVE METABOLIC PANEL
ALT: 22 U/L (ref 0–44)
AST: 32 U/L (ref 15–41)
Albumin: 3.3 g/dL — ABNORMAL LOW (ref 3.5–5.0)
Alkaline Phosphatase: 76 U/L (ref 38–126)
Anion gap: 11 (ref 5–15)
BUN: 41 mg/dL — ABNORMAL HIGH (ref 8–23)
CO2: 21 mmol/L — ABNORMAL LOW (ref 22–32)
Calcium: 8.3 mg/dL — ABNORMAL LOW (ref 8.9–10.3)
Chloride: 100 mmol/L (ref 98–111)
Creatinine, Ser: 2.19 mg/dL — ABNORMAL HIGH (ref 0.61–1.24)
GFR calc Af Amer: 32 mL/min — ABNORMAL LOW (ref >60)
GFR calc non Af Amer: 28 mL/min — ABNORMAL LOW (ref >60)
Glucose, Bld: 141 mg/dL — ABNORMAL HIGH (ref 70–99)
Potassium: 4.7 mmol/L (ref 3.5–5.1)
Sodium: 132 mmol/L — ABNORMAL LOW (ref 135–145)
Total Bilirubin: 0.5 mg/dL (ref 0.3–1.2)
Total Protein: 6.1 g/dL — ABNORMAL LOW (ref 6.5–8.1)

## 2019-01-19 LAB — CBC WITH DIFFERENTIAL/PLATELET
Abs Immature Granulocytes: 0.06 10*3/uL (ref 0.00–0.07)
Basophils Absolute: 0 10*3/uL (ref 0.0–0.1)
Basophils Relative: 0 %
Eosinophils Absolute: 0.1 10*3/uL (ref 0.0–0.5)
Eosinophils Relative: 0 %
HCT: 28.2 % — ABNORMAL LOW (ref 39.0–52.0)
Hemoglobin: 9.2 g/dL — ABNORMAL LOW (ref 13.0–17.0)
Immature Granulocytes: 1 %
Lymphocytes Relative: 6 %
Lymphs Abs: 0.6 10*3/uL — ABNORMAL LOW (ref 0.7–4.0)
MCH: 30.2 pg (ref 26.0–34.0)
MCHC: 32.6 g/dL (ref 30.0–36.0)
MCV: 92.5 fL (ref 80.0–100.0)
Monocytes Absolute: 0.8 10*3/uL (ref 0.1–1.0)
Monocytes Relative: 7 %
Neutro Abs: 9.9 10*3/uL — ABNORMAL HIGH (ref 1.7–7.7)
Neutrophils Relative %: 86 %
Platelets: 175 10*3/uL (ref 150–400)
RBC: 3.05 MIL/uL — ABNORMAL LOW (ref 4.22–5.81)
RDW: 13.8 % (ref 11.5–15.5)
WBC: 11.5 10*3/uL — ABNORMAL HIGH (ref 4.0–10.5)
nRBC: 0 % (ref 0.0–0.2)

## 2019-01-19 LAB — BLOOD CULTURE ID PANEL (REFLEXED)

## 2019-01-19 LAB — LACTIC ACID, PLASMA
Lactic Acid, Venous: 2.1 mmol/L (ref 0.5–1.9)
Lactic Acid, Venous: 1.6 mmol/L (ref 0.5–1.9)

## 2019-01-19 MED ORDER — SODIUM CHLORIDE 0.9 % IV SOLN
2.0000 g | INTRAVENOUS | Status: DC
  Administered 2019-01-20: 2 g via INTRAVENOUS
  Filled 2019-01-19 (×2): qty 2

## 2019-01-19 MED ORDER — ACETAMINOPHEN 650 MG RE SUPP: 650.0000 mg | Freq: Four times a day (QID) | RECTAL | Status: DC | PRN

## 2019-01-19 MED ORDER — PANTOPRAZOLE SODIUM 40 MG PO TBEC
40.0000 mg | DELAYED_RELEASE_TABLET | Freq: Two times a day (BID) | ORAL | Status: DC
  Administered 2019-01-20 – 2019-01-24 (×8): 40 mg via ORAL
  Filled 2019-01-19 (×9): qty 1

## 2019-01-19 MED ORDER — ACETAMINOPHEN 325 MG PO TABS
650.0000 mg | ORAL_TABLET | Freq: Four times a day (QID) | ORAL | Status: DC | PRN
  Administered 2019-01-20: 650 mg via ORAL
  Filled 2019-01-19: qty 2

## 2019-01-19 MED ORDER — MORPHINE SULFATE (PF) 2 MG/ML IV SOLN: 2.0000 mg | INTRAVENOUS | Status: DC | PRN

## 2019-01-19 MED ORDER — SODIUM CHLORIDE 0.9 % IV BOLUS
1000.0000 mL | Freq: Once | INTRAVENOUS | Status: AC
  Administered 2019-01-19: 1000 mL via INTRAVENOUS

## 2019-01-19 MED ORDER — INSULIN ASPART 100 UNIT/ML ~~LOC~~ SOLN: 0.0000 [IU] | SUBCUTANEOUS | Status: DC

## 2019-01-19 MED ORDER — ONDANSETRON HCL 4 MG PO TABS: 4.0000 mg | ORAL_TABLET | Freq: Four times a day (QID) | ORAL | Status: DC | PRN

## 2019-01-19 MED ORDER — SODIUM CHLORIDE 0.9 % IV SOLN
2.0000 g | Freq: Once | INTRAVENOUS | Status: AC
  Administered 2019-01-20: 2 g via INTRAVENOUS
  Filled 2019-01-19: qty 2

## 2019-01-19 MED ORDER — CLOPIDOGREL BISULFATE 75 MG PO TABS
75.0000 mg | ORAL_TABLET | Freq: Every day | ORAL | Status: DC
  Administered 2019-01-20 – 2019-01-24 (×5): 75 mg via ORAL
  Filled 2019-01-19 (×5): qty 1

## 2019-01-19 MED ORDER — ROSUVASTATIN CALCIUM 20 MG PO TABS
20.0000 mg | ORAL_TABLET | Freq: Every evening | ORAL | Status: DC
  Administered 2019-01-20 – 2019-01-23 (×4): 20 mg via ORAL
  Filled 2019-01-19 (×4): qty 1

## 2019-01-19 MED ORDER — SODIUM CHLORIDE 0.9 % IV SOLN
INTRAVENOUS | Status: DC
  Administered 2019-01-20: 01:00:00 via INTRAVENOUS

## 2019-01-19 MED ORDER — HEPARIN SODIUM (PORCINE) 5000 UNIT/ML IJ SOLN
5000.0000 [IU] | Freq: Three times a day (TID) | INTRAMUSCULAR | Status: DC
  Administered 2019-01-20 – 2019-01-24 (×10): 5000 [IU] via SUBCUTANEOUS
  Filled 2019-01-19 (×10): qty 1

## 2019-01-19 MED ORDER — ONDANSETRON HCL 4 MG/2ML IJ SOLN: 4.0000 mg | Freq: Four times a day (QID) | INTRAMUSCULAR | Status: DC | PRN

## 2019-01-19 MED ORDER — SODIUM CHLORIDE 0.9% FLUSH: 3.0000 mL | Freq: Once | INTRAVENOUS | Status: DC

## 2019-01-19 NOTE — Progress Notes (Deleted)
Pharmacy Antibiotic Note  Tony Granquist is a 78 y.o. male admitted on 01/19/2019 with bacteremia and sepsis with enterobacteraiceae on blood cx.  Pharmacy has been consulted for cefepime dosing.  Plan: Cefepime 2g IV Q12H.  Height: 5\' 5"  (165.1 cm) Weight: 135 lb (61.2 kg) IBW/kg (Calculated) : 61.5  Temp (24hrs), Avg:98.6 F (37 C), Min:98.3 F (36.8 C), Max:99.1 F (37.3 C)  Recent Labs  Lab 01/18/19 1728 01/18/19 1840 01/19/19 1843 01/19/19 2014  WBC 13.9*  --  11.5*  --   CREATININE 1.93* 1.80* 2.19*  --   LATICACIDVEN  --   --  2.1* 1.6    Estimated Creatinine Clearance: 24.5 mL/min (A) (by C-G formula based on SCr of 2.19 mg/dL (H)).    Allergies  Allergen Reactions  . Asa [Aspirin] Other (See Comments)    GI bleeding  . Gadolinium Derivatives Itching, Swelling and Other (See Comments)    Pt complained of face flushing/hottness and throat tightness/scratchiness immediately after the injections.  Within 4 minutes, all symptoms were gone and the study was completed.  No further complications or signs of allergy were exhibited after completion of study.   . Oxycodone-Acetaminophen Other (See Comments)    Dizziness and feeling of being uncomfortable     Thank you for allowing pharmacy to be a part of this patient's care.  Wynona Neat, PharmD, BCPS  01/19/2019 11:49 PM

## 2019-01-19 NOTE — ED Notes (Signed)
CRITICAL VALUE ALERT  Critical Value: Lactic Acid 2.1  Date & Time Notied: 01/19/19  Provider Notified: Dr. Sherry Ruffing  Orders Received/Actions taken: Continue to monitor.

## 2019-01-19 NOTE — Progress Notes (Signed)
Pharmacy Antibiotic Note  Christian Bailey is a 78 y.o. male admitted on 01/19/2019 with after positive blood culture for Enterobacteriaceae. Pt has recent gallbladder cholesystostomy tube placement. Pharmacy has been consulted for cefepime dosing, sensitivities pending.   Plan: -Cefepime 2g IV x1 then 2g IV q24h -Monitor sensitivites, renal function, LOT     Temp (24hrs), Avg:98.6 F (37 C), Min:98.3 F (36.8 C), Max:99.1 F (37.3 C)  Recent Labs  Lab 01/18/19 1728 01/18/19 1840 01/19/19 1843 01/19/19 2014  WBC 13.9*  --  11.5*  --   CREATININE 1.93* 1.80* 2.19*  --   LATICACIDVEN  --   --  2.1* 1.6    CrCl cannot be calculated (Unknown ideal weight.).    Allergies  Allergen Reactions  . Asa [Aspirin] Other (See Comments)    GI bleeding  . Gadolinium Derivatives Itching, Swelling and Other (See Comments)    Pt complained of face flushing/hottness and throat tightness/scratchiness immediately after the injections.  Within 4 minutes, all symptoms were gone and the study was completed.  No further complications or signs of allergy were exhibited after completion of study.   . Oxycodone-Acetaminophen Other (See Comments)    Dizziness and feeling of being uncomfortable     Antimicrobials this admission: Cefepime 7/23 >>    Microbiology results: 7/22 BCx: Enterobacteriaceae  Thank you for allowing pharmacy to be a part of this patient's care.   Arrie Senate, PharmD, BCPS Clinical Pharmacist Please check AMION for all [facility] Pharmacy numbers 01/19/2019

## 2019-01-19 NOTE — ED Provider Notes (Signed)
[facility] EMERGENCY DEPARTMENT Provider Note   CSN: [id number] Arrival date & time: 01/19/19  1827    History   Chief Complaint Chief Complaint  Patient presents with   Blood Infection    HPI Christian Bailey is a 78 y.o. male.     78 y.o. male with a past medical history significant for GERD, PAD, s/p aortobifemoral bypass grafting 07/06/18 with [name] which was complicated by development of post-op cholecystitis. He underwent cholecystostomy tube placement 07/19/18 by Dr. Vernard Gambles for suspected acalculous cholecystitis.  Patient was evaluated in the emergency department yesterday for fever.  Fever began after patient had his gallbladder evaluated in the IR suite.  He had initially been experiencing severe right-sided abdominal pain, though this subsided by time of ED evaluation and has not significantly recurred.  He currently denies any abdominal pain during my assessment.  Does have waves of nausea which wax and wane, but this has been ongoing for many months.  Has not had any documented fevers since last hospital discharge, but did awake from sleep this morning "drenched in sweat".  He was called and told to return to the ED due to his blood cultures, that were drawn yesterday, returning positive.       Past Medical History:  Diagnosis Date   Anemia    Complication of anesthesia    Diabetes mellitus ([place])    TYPE 2   GERD (gastroesophageal reflux disease)    History of blood transfusion    GI bleed   History of colon polyps    History of hiatal hernia    Hyperlipemia    Hypertension    Osteoarthritis    PAD (peripheral artery disease) ([place])    a. stenting of his left common iliac artery >20 years ago. b. h/o LEIA stent and 2 stents to R SFA in 2011. c. 04/2014:  s/p PTA of right SFA for in-stent restenosis, occluded left SFA   PONV (postoperative nausea and vomiting)    no porblem with the last 3 surgeries   RBBB (right bundle branch  block with left anterior fascicular block)    NUCLEAR STRESS TEST, 08/18/2010 - no significant wall motion abnoramlities noted, post-stress EF 69%, normal myocardial perfusion study   Sinus tachycardia    a. Noted during admission 04/2014 but upon review seems to be frequent finding for patient.   Stenosis of carotid artery    a. 50% right carotid stenosis by angiogram 04/2014.    Patient Active Problem List   Diagnosis Date Noted   Bacteremia due to Gram-negative bacteria 01/19/2019   CKD (chronic kidney disease) stage 3, GFR 30-59 ml/min (HCC) 01/19/2019   Stenosis of infrarenal abdominal aorta due to atherosclerosis ([place]) 07/06/2018   S/P shoulder replacement, right 12/07/2016   PVD (peripheral vascular disease) ([place]) 09/18/2016   Sinus tachycardia 05/15/2014   Carotid artery disease ([place]) 04/26/2014   PAD (peripheral artery disease) ([place]) 06/12/2013   RBBB (right bundle branch block with left anterior fascicular block) 06/12/2013   Claudication ([place]) 12/14/2012   Essential hypertension 12/14/2012   Hyperlipidemia 12/14/2012   Type 2 diabetes mellitus ([place]) 12/14/2012   Personal history of colonic polyps 01/25/2012   DM 08/11/2010   DUODENAL ULCER, ACUTE, HEMORRHAGE 08/11/2010    Past Surgical History:  Procedure Laterality Date   ABDOMINAL AORTOGRAM W/LOWER EXTREMITY N/A 01/27/2017   Procedure: Abdominal Aortogram w/Lower Extremity;  Surgeon: Serafina Mitchell, MD;  Location: [place] [facility];  Service: Cardiovascular;  Laterality:  N/A;   ABDOMINAL AORTOGRAM W/LOWER EXTREMITY N/A 06/08/2017   Procedure: ABDOMINAL AORTOGRAM W/LOWER EXTREMITY;  Surgeon: Serafina Mitchell, MD;  Location: [place] [facility];  Service: Cardiovascular;  Laterality: N/A;   ABDOMINAL AORTOGRAM W/LOWER EXTREMITY N/A 08/31/2017   Procedure: ABDOMINAL AORTOGRAM W/LOWER EXTREMITY;  Surgeon: Serafina Mitchell, MD;  Location: [place] [facility];  Service: Cardiovascular;  Laterality: N/A;  rt.  unilateral   ANGIOPLASTY / STENTING FEMORAL     ANGIOPLASTY / STENTING ILIAC     AORTA - BILATERAL FEMORAL ARTERY BYPASS GRAFT N/A 07/06/2018   Procedure: AORTA BIFEMORAL BYPASS GRAFT USING 14X7MM X 40CM HEMASHIELD GOLD GRAFT;  Surgeon: Serafina Mitchell, MD;  Location: [place];  Service: Vascular;  Laterality: N/A;   CEREBRAL ANGIOGRAM N/A 05/14/2014   Procedure: CEREBRAL ANGIOGRAM;  Surgeon: Lorretta Harp, MD;  Location: [facility] CATH LAB;  Service: Cardiovascular;  Laterality: N/A;   COLONOSCOPY W/ POLYPECTOMY     ENDARTERECTOMY Right 09/08/2017   ENDARTERECTOMY FEMORAL Right 09/08/2017   Procedure: REDO RIGHT FEMORAL ENDARTECTOMY WITH PATCH ANGIOPLASTY.;  Surgeon: Serafina Mitchell, MD;  Location: [facility];  Service: Vascular;  Laterality: Right;   EYE SURGERY Bilateral    cataract   FEMORAL ARTERY STENT Right 05/12/2010   Stented distally with a 6x100 Abbott absolute stent and proximally with a 6x60 Cook Zilver stent resulting in the reduction of the proximal segment 80% and mid segment 60-70% to 0% residual, LEFT common femoral artery stented with a 7x3 Smart stent resulting in reduction of 90% stenosis to 0% residual   FEMORAL-POPLITEAL BYPASS GRAFT Right 09/18/2016   Procedure: BYPASS GRAFT FEMORAL-POPLITEAL ARTERY;  Surgeon: Serafina Mitchell, MD;  Location: [place];  Service: Vascular;  Laterality: Right;   FEMORAL-POPLITEAL BYPASS GRAFT Right 09/08/2017   Procedure: REDO BYPASS GRAFT FEMORAL-POPLITEAL ARTERY;  Surgeon: Serafina Mitchell, MD;  Location: [facility];  Service: Vascular;  Laterality: Right;   FEMORAL-POPLITEAL BYPASS GRAFT Right 07/06/2018   Procedure: REVISION RIGHT FEMORAL TO POPLITEAL ARTERY BYPASS GRAFT;  Surgeon: Serafina Mitchell, MD;  Location: [place];  Service: Vascular;  Laterality: Right;   IR CHOLANGIOGRAM EXISTING TUBE  08/04/2018   IR PERC CHOLECYSTOSTOMY  07/19/2018   IR RADIOLOGIST EVAL & MGMT  08/31/2018   IR RADIOLOGIST EVAL & MGMT  01/17/2019   KNEE ARTHROSCOPY      left   LOWER EXTREMITY ANGIOGRAM N/A 05/14/2014   Procedure: LOWER EXTREMITY ANGIOGRAM;  Surgeon: Lorretta Harp, MD;  Location: [facility];  Service: Cardiovascular;  Laterality: N/A;   LOWER EXTREMITY ANGIOGRAPHY N/A 09/21/2016   Procedure: Lower Extremity Angiography;  Surgeon: [place], MD;  Location: [place] [facility];  Service: Cardiovascular;  Laterality: N/A;   PERIPHERAL VASCULAR ATHERECTOMY Right 01/27/2017   Procedure: PERIPHERAL VASCULAR ATHERECTOMY;  Surgeon: Serafina Mitchell, MD;  Location: [place] [facility];  Service: Cardiovascular;  Laterality: Right;   PERIPHERAL VASCULAR BALLOON ANGIOPLASTY Right 06/08/2017   Procedure: PERIPHERAL VASCULAR BALLOON ANGIOPLASTY;  Surgeon: Serafina Mitchell, MD;  Location: [place] [facility];  Service: Cardiovascular;  Laterality: Right;  common femoral and superficial femoral arteries   PERIPHERAL VASCULAR CATHETERIZATION N/A 04/20/2016   Procedure: Lower Extremity Intervention;  Surgeon: Lorretta Harp, MD;  Location: [place] [facility];  Service: Cardiovascular;  Laterality: N/A;   PERIPHERAL VASCULAR INTERVENTION  08/31/2017   Procedure: PERIPHERAL VASCULAR INTERVENTION;  Surgeon: Serafina Mitchell, MD;  Location: [place] [facility];  Service: Cardiovascular;;  REIA  REVERSE SHOULDER ARTHROPLASTY Right 12/07/2016   REVERSE SHOULDER ARTHROPLASTY Right 12/07/2016   Procedure: REVERSE SHOULDER ARTHROPLASTY;  Surgeon: Netta [place], MD;  Location: [place];  Service: Orthopedics;  Laterality: Right;   ROTATOR CUFF REPAIR Right 2003   SFA Right 05/14/2014   PTA  OF RT SFA         DR BERRY        Home Medications    Prior to Admission medications   Medication Sig Start Date End Date Taking? Authorizing Provider  clopidogrel (PLAVIX) 75 MG tablet TAKE 1 TABLET DAILY Patient taking differently: Take 75 mg by mouth daily.  08/29/18  Yes Lorretta Harp, MD  lisinopril (PRINIVIL,ZESTRIL) 5 MG tablet Take 5 mg by mouth 2 (two) times  daily.    Yes [provider]  metFORMIN (GLUCOPHAGE) 1000 MG tablet Take 1 tablet (1,000 mg total) by mouth 2 (two) times daily with a meal. 05/16/14  Yes Dunn, Dayna N, PA-C  metoprolol tartrate (LOPRESSOR) 25 MG tablet Take 1 tablet (25 mg total) by mouth 2 (two) times daily. 07/29/18  Yes Laurence Slate M, PA-C  pantoprazole (PROTONIX) 40 MG tablet TAKE 1 TABLET TWICE A DAY Patient taking differently: Take 40 mg by mouth 2 (two) times daily.  12/20/18  Yes Lorretta Harp, MD  rosuvastatin (CRESTOR) 20 MG tablet Take 20 mg by mouth every evening.    Yes [provider]    Family History Family History  Problem Relation Age of Onset   Heart disease Mother    Leukemia Mother    Stomach cancer Father    Esophageal cancer Brother    Liver disease Brother    Alcoholism Brother    Leukemia Brother    Leukemia Brother    Diabetes type II Brother    Lung disease Sister     Social History Social History   Tobacco Use   Smoking status: Former Smoker    Years: 25.00    Quit date: 06/1988    Years since quitting: 30.5   Smokeless tobacco: Never Used  Substance Use Topics   Alcohol use: Yes    Comment: rarely   Drug use: No     Allergies   Asa [aspirin], Gadolinium derivatives, and Oxycodone-acetaminophen   Review of Systems Review of Systems Ten systems reviewed and are negative for acute change, except as noted in the HPI.    Physical Exam Updated Vital Signs BP (!) 156/47    Pulse 66    Temp 98.3 F (36.8 C) (Oral)    Resp 16    Ht 5\' 5"  (1.651 m)    Wt 61.2 kg    SpO2 100%    BMI 22.47 kg/m   Physical Exam Vitals signs and nursing note reviewed.  Constitutional:      General: He is not in acute distress.    Appearance: He is well-developed. He is not diaphoretic.     Comments: Nontoxic appearing, pleasant.  HENT:     Head: Normocephalic and atraumatic.  Eyes:     General: No scleral icterus.    Conjunctiva/sclera: Conjunctivae  normal.  Neck:     Musculoskeletal: Normal range of motion.  Cardiovascular:     Rate and Rhythm: Regular rhythm. Tachycardia present.     Pulses: Normal pulses.  Pulmonary:     Effort: Pulmonary effort is normal. No respiratory distress.     Comments: Respirations even and unlabored Abdominal:     Comments: Mild RUQ TTP. Abdomen  nondistended. Drain in RUQ appears appropriate. Site of insertion without erythema or drainage.  Musculoskeletal: Normal range of motion.  Skin:    General: Skin is warm and dry.     Coloration: Skin is not pale.     Findings: No erythema or rash.  Neurological:     Mental Status: He is alert and oriented to person, place, and time.  Psychiatric:        Behavior: Behavior normal.    ED Treatments / Results  Labs (all labs ordered are listed, but only abnormal results are displayed) Labs Reviewed  LACTIC ACID, PLASMA - Abnormal; Notable for the following components:      Result Value   Lactic Acid, Venous 2.1 (*)    All other components within normal limits  COMPREHENSIVE METABOLIC PANEL - Abnormal; Notable for the following components:   Sodium 132 (*)    CO2 21 (*)    Glucose, Bld 141 (*)    BUN 41 (*)    Creatinine, Ser 2.19 (*)    Calcium 8.3 (*)    Total Protein 6.1 (*)    Albumin 3.3 (*)    GFR calc non Af Amer 28 (*)    GFR calc Af Amer 32 (*)    All other components within normal limits  CBC WITH DIFFERENTIAL/PLATELET - Abnormal; Notable for the following components:   WBC 11.5 (*)    RBC 3.05 (*)    Hemoglobin 9.2 (*)    HCT 28.2 (*)    Neutro Abs 9.9 (*)    Lymphs Abs 0.6 (*)    All other components within normal limits  CULTURE, BLOOD (ROUTINE X 2)  CULTURE, BLOOD (ROUTINE X 2)  BODY FLUID CULTURE  SARS CORONAVIRUS 2 (HOSPITAL ORDER, [place] [facility])  CULTURE, BLOOD (SINGLE)  LACTIC ACID, PLASMA  CBC  COMPREHENSIVE METABOLIC PANEL  HEMOGLOBIN A1C    EKG None  Radiology Ct Abdomen Pelvis W  Contrast  Result Date: 01/18/2019 CLINICAL DATA:  Abdominal pain and fevers, known cholecystostomy tube EXAM: CT ABDOMEN AND PELVIS WITH CONTRAST TECHNIQUE: Multidetector CT imaging of the abdomen and pelvis was performed using the standard protocol following bolus administration of intravenous contrast. CONTRAST:  149mL OMNIPAQUE IOHEXOL 300 MG/ML  SOLN COMPARISON:  08/03/2018 FINDINGS: Lower chest: No acute abnormality. Hepatobiliary: Scattered cysts are noted within the liver stable from the prior exam. The gallbladder is decompressed by cholecystostomy tube which is redundant within the gallbladder. No significant dilatation of the gallbladder is seen to suggest tube obstruction. No leakage of fluid is identified. No biliary ductal dilatation is seen. Pancreas: Unremarkable. No pancreatic ductal dilatation or surrounding inflammatory changes. Spleen: Normal in size without focal abnormality. Adrenals/Urinary Tract: Adrenal glands are within normal limits. Kidneys are well visualized bilaterally. Some scarring in the lower pole of the left kidney is again noted and stable. Renal cysts are seen bilaterally. A nonobstructing right renal stone is noted. No ureteral calculi are seen. The bladder is partially distended. Stomach/Bowel: No obstructive or inflammatory changes of the colon are seen. The appendix is air-filled and within normal limits. No small bowel or gastric abnormality is noted. Vascular/Lymphatic: There are changes consistent with prior aortic bypass. The bypass graft is patent bilaterally previously seen perigraft seromas have resolved in the interval. No significant lymphadenopathy is noted. Reproductive: Prostate is unremarkable. Other: No abdominal wall hernia or abnormality. No abdominopelvic ascites. Musculoskeletal: Degenerative changes of lumbar spine are noted stable from the prior exam. IMPRESSION: Cholecystostomy  tube in satisfactory position. No pericholecystic fluid is noted. No  findings to suggest tube obstruction are seen. Chronic changes similar to that seen on the prior exam. Resolution of perigraft seromas surrounding the known aortobifemoral bypass graft. Electronically Signed   By: Inez Catalina M.D.   On: 01/18/2019 20:30   Dg Chest Port 1 View  Result Date: 01/18/2019 CLINICAL DATA:  78 year old male with fever. EXAM: PORTABLE CHEST 1 VIEW COMPARISON:  Chest radiograph dated 08/03/2018 FINDINGS: There is no focal consolidation, pleural effusion, or pneumothorax. The cardiac silhouette is within normal limits. No acute osseous pathology. Right shoulder arthroplasty. Right upper quadrant percutaneous pigtail cholecystostomy. IMPRESSION: No active disease. Electronically Signed   By: Anner Crete M.D.   On: 01/18/2019 20:17    Procedures .Critical Care Performed by: Antonietta Breach, PA-C Authorized by: Antonietta Breach, PA-C   Critical care provider statement:    Critical care time (minutes):  45   Critical care was necessary to treat or prevent imminent or life-threatening deterioration of the following conditions:  Sepsis   Critical care was time spent personally by me on the following activities:  Discussions with consultants, evaluation of patient's response to treatment, examination of patient, ordering and performing treatments and interventions, ordering and review of laboratory studies, ordering and review of radiographic studies, pulse oximetry, re-evaluation of patient's condition, obtaining history from patient or surrogate and review of old charts   (including critical care time)  Medications Ordered in ED Medications  sodium chloride flush (NS) 0.9 % injection 3 mL (has no administration in time range)  sodium chloride 0.9 % bolus 1,000 mL (has no administration in time range)  ceFEPIme (MAXIPIME) 2 g in sodium chloride 0.9 % 100 mL IVPB (has no administration in time range)  0.9 %  sodium chloride infusion (has no administration in time range)    insulin aspart (novoLOG) injection 0-9 Units (has no administration in time range)  clopidogrel (PLAVIX) tablet 75 mg (has no administration in time range)  rosuvastatin (CRESTOR) tablet 20 mg (has no administration in time range)  pantoprazole (PROTONIX) EC tablet 40 mg (has no administration in time range)  morphine 2 MG/ML injection 2-4 mg (has no administration in time range)  acetaminophen (TYLENOL) tablet 650 mg (has no administration in time range)    Or  acetaminophen (TYLENOL) suppository 650 mg (has no administration in time range)  ondansetron (ZOFRAN) tablet 4 mg (has no administration in time range)    Or  ondansetron (ZOFRAN) injection 4 mg (has no administration in time range)  heparin injection 5,000 Units (has no administration in time range)  ceFEPIme (MAXIPIME) 2 g in sodium chloride 0.9 % 100 mL IVPB (has no administration in time range)     Initial Impression / Assessment and Plan / ED Course  I have reviewed the triage vital signs and the nursing notes.  Pertinent labs & imaging results that were available during my care of the patient were reviewed by me and considered in my medical decision making (see chart for details).        78 year old male to be admitted to the hospital for management of sepsis.  Blood cultures drawn yesterday which grew Enterobacter.  Patient to be admitted by Dr. Alcario Drought of [facility].   Final Clinical Impressions(s) / ED Diagnoses   Final diagnoses:  Sepsis due to Enterobacter species [facility])    ED Discharge Orders    None       Antonietta Breach, PA-C 01/19/19 2353  Fatima Blank, MD 01/20/19 2035

## 2019-01-19 NOTE — ED Triage Notes (Signed)
Pt told to come in for positive blood cultures. Pt was seen last night and discharged. Pt has a drain to his gallbladder. Denies any symptoms currently.

## 2019-01-20 ENCOUNTER — Ambulatory Visit (HOSPITAL_COMMUNITY): Admission: RE | Admit: 2019-01-20 | Payer: Medicare Other | Source: Ambulatory Visit

## 2019-01-20 DIAGNOSIS — T8579XA Infection and inflammatory reaction due to other internal prosthetic devices, implants and grafts, initial encounter: Principal | ICD-10-CM

## 2019-01-20 DIAGNOSIS — N183 Chronic kidney disease, stage 3 (moderate): Secondary | ICD-10-CM

## 2019-01-20 DIAGNOSIS — E119 Type 2 diabetes mellitus without complications: Secondary | ICD-10-CM

## 2019-01-20 DIAGNOSIS — I10 Essential (primary) hypertension: Secondary | ICD-10-CM

## 2019-01-20 DIAGNOSIS — R7881 Bacteremia: Secondary | ICD-10-CM

## 2019-01-20 LAB — COMPREHENSIVE METABOLIC PANEL WITH GFR
ALT: 21 U/L (ref 0–44)
AST: 25 U/L (ref 15–41)
Albumin: 3 g/dL — ABNORMAL LOW (ref 3.5–5.0)
Alkaline Phosphatase: 72 U/L (ref 38–126)
Anion gap: 10 (ref 5–15)
BUN: 37 mg/dL — ABNORMAL HIGH (ref 8–23)
CO2: 21 mmol/L — ABNORMAL LOW (ref 22–32)
Calcium: 8.3 mg/dL — ABNORMAL LOW (ref 8.9–10.3)
Chloride: 107 mmol/L (ref 98–111)
Creatinine, Ser: 1.87 mg/dL — ABNORMAL HIGH (ref 0.61–1.24)
GFR calc Af Amer: 39 mL/min — ABNORMAL LOW
GFR calc non Af Amer: 34 mL/min — ABNORMAL LOW
Glucose, Bld: 108 mg/dL — ABNORMAL HIGH (ref 70–99)
Potassium: 4.9 mmol/L (ref 3.5–5.1)
Sodium: 138 mmol/L (ref 135–145)
Total Bilirubin: 0.5 mg/dL (ref 0.3–1.2)
Total Protein: 5.7 g/dL — ABNORMAL LOW (ref 6.5–8.1)

## 2019-01-20 LAB — SARS CORONAVIRUS 2 BY RT PCR (HOSPITAL ORDER, PERFORMED IN ~~LOC~~ HOSPITAL LAB): SARS Coronavirus 2: NEGATIVE

## 2019-01-20 LAB — CBC
HCT: 26 % — ABNORMAL LOW (ref 39.0–52.0)
Hemoglobin: 8.4 g/dL — ABNORMAL LOW (ref 13.0–17.0)
MCH: 29.9 pg (ref 26.0–34.0)
MCHC: 32.3 g/dL (ref 30.0–36.0)
MCV: 92.5 fL (ref 80.0–100.0)
Platelets: 152 K/uL (ref 150–400)
RBC: 2.81 MIL/uL — ABNORMAL LOW (ref 4.22–5.81)
RDW: 13.6 % (ref 11.5–15.5)
WBC: 7.4 K/uL (ref 4.0–10.5)
nRBC: 0 % (ref 0.0–0.2)

## 2019-01-20 LAB — GLUCOSE, CAPILLARY
Glucose-Capillary: 106 mg/dL — ABNORMAL HIGH (ref 70–99)
Glucose-Capillary: 107 mg/dL — ABNORMAL HIGH (ref 70–99)
Glucose-Capillary: 121 mg/dL — ABNORMAL HIGH (ref 70–99)
Glucose-Capillary: 102 mg/dL — ABNORMAL HIGH (ref 70–99)
Glucose-Capillary: 175 mg/dL — ABNORMAL HIGH (ref 70–99)

## 2019-01-20 LAB — HEMOGLOBIN A1C
Hgb A1c MFr Bld: 5.9 % — ABNORMAL HIGH (ref 4.8–5.6)
Mean Plasma Glucose: 122.63 mg/dL

## 2019-01-20 MED ORDER — METOPROLOL TARTRATE 5 MG/5ML IV SOLN: 5.0000 mg | Freq: Once | INTRAVENOUS | Status: DC

## 2019-01-20 MED ORDER — METOPROLOL TARTRATE 25 MG PO TABS: 25.0000 mg | ORAL_TABLET | Freq: Two times a day (BID) | ORAL | Status: DC

## 2019-01-20 MED ORDER — METOPROLOL TARTRATE 25 MG PO TABS
25.0000 mg | ORAL_TABLET | Freq: Two times a day (BID) | ORAL | Status: DC
  Administered 2019-01-20 – 2019-01-24 (×9): 25 mg via ORAL
  Filled 2019-01-20 (×9): qty 1

## 2019-01-20 MED ORDER — INSULIN ASPART 100 UNIT/ML ~~LOC~~ SOLN
0.0000 [IU] | Freq: Three times a day (TID) | SUBCUTANEOUS | Status: DC
  Administered 2019-01-21: 1 [IU] via SUBCUTANEOUS
  Administered 2019-01-21: 3 [IU] via SUBCUTANEOUS
  Administered 2019-01-22: 2 [IU] via SUBCUTANEOUS
  Administered 2019-01-23 (×2): 1 [IU] via SUBCUTANEOUS
  Administered 2019-01-24: 2 [IU] via SUBCUTANEOUS

## 2019-01-20 NOTE — Plan of Care (Signed)
  Problem: Pain Managment: Goal: General experience of comfort will improve Outcome: Progressing   Problem: Safety: Goal: Ability to remain free from injury will improve Outcome: Progressing   Problem: Skin Integrity: Goal: Risk for impaired skin integrity will decrease Outcome: Progressing   

## 2019-01-20 NOTE — Progress Notes (Signed)
Per HPI: Christian Bailey is a 78 y.o. male with medical history significant of DM2, HTN, PAD s/p aorto-bifem-bypass in Jan this year.  Acute cholecystitis Post op in Jan s/p cholecystostomy tube for past 7 months was actually scheduled to have change out tomorrow.  Patient presented to the ED yesterday for fever, R sided abdominal pain that started at the entrance point of his cholecystostomy tube.  CT scan at that time showed tube in appropriate position without obvious complications.  Bypass graft also looked fine, previous seromas around bypass graft had resolved.  He was given fluids and tylenol, cultures drawn, despite being persistently tachycardic patient elected to go home.  Today his BCx have come back 2 out of 2 positive for GNR, apparently an enterobacteriacae species.  He was called and asked to return to ED.  I have seen and evaluated the patient at bedside this morning.  Patient was admitted for treatment of gram-negative rod bacteremia with final identification and sensitivity still pending.  He has been started on cefepime empirically and has not had any fevers overnight and does have improvement in leukocytosis noted on CBC.  COVID testing negative.  He is in good spirits and denies any abdominal pain at this time.  His cholecystostomy tube is not leaking and is producing scant output.  Blood pressure and glucose is stable.  Creatinine on renal function testing is also seen to be improving.  Will maintain on IV antibiotics and normal saline, time-limited.  Continue to hold metformin as well as lisinopril and metoprolol, due to stable blood glucose and blood pressure readings respectively.  Plans for IR to evaluate drain today and collect further cultures and/or change a catheter.  I do not feel that ID or vascular evaluation is currently needed as it appears that the source is likely the drain given his recent right upper quadrant abdominal pain.  CT evaluation does not demonstrate any  apparent graft infection at this time.  May consider consultation to ID and vascular as needed based on further pending results. Continue on IV cefepime as ordered.

## 2019-01-20 NOTE — Consult Note (Signed)
Chief Complaint: Patient was seen in consultation today for percutaneous cholecystostomy  drain evaluation and exchange Chief Complaint  Patient presents with   Blood Infection   at the request of Dr Sheran Luz   Supervising Physician: Corrie Mckusick  Patient Status: [facility] - In-pt  History of Present Illness: Christian Bailey is a 78 y.o. male   Perc chole drain placed 07/19/18 Calculus cholecystitis--- drain injections 08/03/18; 08/31/18  Evaluation 01/17/19: IMPRESSION: 1. Appropriately positioned and functioning cholecystostomy tube. 2. No evidence of cholelithiasis or choledocholithiasis. PLAN: - The patient will be scheduled for routine exchange of the cholecystostomy tube later this week as the tube has not been exchanged since the time of initial placement on 07/19/2018. - if the exchange is uneventful, the cholecystostomy tube will be capped for a trial of internalization. - Assuming the patient passes his trial of internalization/capping, he will return to the interventional radiology drain Clinic 1 week following the exchange/capping for repeat cholangiogram and potential cholecystostomy tube removal. Above discussed and agreed upon with referring surgeon, Dr. Dema Severin.  Had come to ED 7/22 for fever and abd pain BC drawn Called next day to return to Hospital secondary + [facility] Enterobacteriaceae species NOT DETECTED DETECTEDAbnormal     Now treated on Maxipime IV  Was scheduled for evaluation in IR as OP of drain and trial capping Plan was to recheck 1 week and remove drain  Now for evaluation in IR today Probable exchange/ possible capping  Past Medical History:  Diagnosis Date   Anemia    Complication of anesthesia    Diabetes mellitus ([place])    TYPE 2   GERD (gastroesophageal reflux disease)    History of blood transfusion    GI bleed   History of colon polyps    History of hiatal hernia    Hyperlipemia    Hypertension    Osteoarthritis    PAD  (peripheral artery disease) ([place])    a. stenting of his left common iliac artery >20 years ago. b. h/o LEIA stent and 2 stents to R SFA in 2011. c. 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 Noted during admission 04/2014 but upon review seems to be frequent finding for patient.   Stenosis of carotid artery    a. 50% right carotid stenosis by angiogram 04/2014.    Past Surgical History:  Procedure Laterality Date   ABDOMINAL AORTOGRAM W/LOWER EXTREMITY N/A 01/27/2017   Procedure: Abdominal Aortogram w/Lower Extremity;  Surgeon: Serafina Mitchell, MD;  Location: [place] [facility];  Service: Cardiovascular;  Laterality: N/A;   ABDOMINAL AORTOGRAM W/LOWER EXTREMITY N/A 06/08/2017   Procedure: ABDOMINAL AORTOGRAM W/LOWER EXTREMITY;  Surgeon: Serafina Mitchell, MD;  Location: [place] [facility];  Service: Cardiovascular;  Laterality: N/A;   ABDOMINAL AORTOGRAM W/LOWER EXTREMITY N/A 08/31/2017   Procedure: ABDOMINAL AORTOGRAM W/LOWER EXTREMITY;  Surgeon: Serafina Mitchell, MD;  Location: [place] [facility];  Service: Cardiovascular;  Laterality: N/A;  rt. unilateral   ANGIOPLASTY / STENTING FEMORAL     ANGIOPLASTY / STENTING ILIAC     AORTA - BILATERAL FEMORAL ARTERY BYPASS GRAFT N/A 07/06/2018  Procedure: AORTA BIFEMORAL BYPASS GRAFT USING 14X7MM X 40CM HEMASHIELD GOLD GRAFT;  Surgeon: Serafina Mitchell, MD;  Location: [place];  Service: Vascular;  Laterality: N/A;   CEREBRAL ANGIOGRAM N/A 05/14/2014   Procedure: CEREBRAL ANGIOGRAM;  Surgeon: Lorretta Harp, MD;  Location: [facility] CATH LAB;  Service: Cardiovascular;  Laterality: N/A;   COLONOSCOPY W/  POLYPECTOMY     ENDARTERECTOMY Right 09/08/2017   ENDARTERECTOMY FEMORAL Right 09/08/2017   Procedure: REDO RIGHT FEMORAL ENDARTECTOMY WITH PATCH ANGIOPLASTY.;  Surgeon: Serafina Mitchell, MD;  Location: [facility];  Service: Vascular;  Laterality: Right;   EYE SURGERY Bilateral    cataract   FEMORAL ARTERY STENT Right 05/12/2010   Stented distally with a 6x100 Abbott absolute stent and proximally with a 6x60 Cook Zilver stent resulting in the reduction of the proximal segment 80% and mid segment 60-70% to 0% residual, LEFT common femoral artery stented with a 7x3 Smart stent resulting in reduction of 90% stenosis to 0% residual   FEMORAL-POPLITEAL BYPASS GRAFT Right 09/18/2016   Procedure: BYPASS GRAFT FEMORAL-POPLITEAL ARTERY;  Surgeon: Serafina Mitchell, MD;  Location: [place];  Service: Vascular;  Laterality: Right;   FEMORAL-POPLITEAL BYPASS GRAFT Right 09/08/2017   Procedure: REDO BYPASS GRAFT FEMORAL-POPLITEAL ARTERY;  Surgeon: Serafina Mitchell, MD;  Location: [facility];  Service: Vascular;  Laterality: Right;   FEMORAL-POPLITEAL BYPASS GRAFT Right 07/06/2018   Procedure: REVISION RIGHT FEMORAL TO POPLITEAL ARTERY BYPASS GRAFT;  Surgeon: Serafina Mitchell, MD;  Location: [place];  Service: Vascular;  Laterality: Right;   IR CHOLANGIOGRAM EXISTING TUBE  08/04/2018   IR PERC CHOLECYSTOSTOMY  07/19/2018   IR RADIOLOGIST EVAL & MGMT  08/31/2018   IR RADIOLOGIST EVAL & MGMT  01/17/2019   KNEE ARTHROSCOPY     left   LOWER EXTREMITY ANGIOGRAM N/A 05/14/2014   Procedure: LOWER EXTREMITY ANGIOGRAM;  Surgeon: Lorretta Harp, MD;  Location: [facility] CATH LAB;  Service: Cardiovascular;  Laterality: N/A;   LOWER EXTREMITY ANGIOGRAPHY N/A 09/21/2016   Procedure: Lower Extremity Angiography;  Surgeon: Conrad [place], MD;  Location: [place] [facility];  Service: Cardiovascular;  Laterality: N/A;   PERIPHERAL VASCULAR ATHERECTOMY Right 01/27/2017   Procedure: PERIPHERAL VASCULAR ATHERECTOMY;  Surgeon: Serafina Mitchell, MD;   Location: [place] [facility];  Service: Cardiovascular;  Laterality: Right;   PERIPHERAL VASCULAR BALLOON ANGIOPLASTY Right 06/08/2017   Procedure: PERIPHERAL VASCULAR BALLOON ANGIOPLASTY;  Surgeon: Serafina Mitchell, MD;  Location: [place] [facility];  Service: Cardiovascular;  Laterality: Right;  common femoral and superficial femoral arteries   PERIPHERAL VASCULAR CATHETERIZATION N/A 04/20/2016   Procedure: Lower Extremity Intervention;  Surgeon: Lorretta Harp, MD;  Location: [place] [facility];  Service: Cardiovascular;  Laterality: N/A;   PERIPHERAL VASCULAR INTERVENTION  08/31/2017   Procedure: PERIPHERAL VASCULAR INTERVENTION;  Surgeon: Serafina Mitchell, MD;  Location: [place] [facility];  Service: Cardiovascular;;  REIA   REVERSE SHOULDER ARTHROPLASTY Right 12/07/2016   REVERSE SHOULDER ARTHROPLASTY Right 12/07/2016   Procedure: REVERSE SHOULDER ARTHROPLASTY;  Surgeon: Netta Cedars, MD;  Location: [place];  Service: Orthopedics;  Laterality: Right;   ROTATOR CUFF REPAIR Right 2003   SFA Right 05/14/2014   PTA  OF RT SFA         DR BERRY    Allergies: Asa [aspirin], Gadolinium derivatives, and Oxycodone-acetaminophen  Medications: Prior to Admission medications   Medication Sig Start Date End Date Taking? Authorizing Provider  clopidogrel (PLAVIX) 75 MG tablet TAKE 1  TABLET DAILY Patient taking differently: Take 75 mg by mouth daily.  08/29/18  Yes Lorretta Harp, MD  lisinopril (PRINIVIL,ZESTRIL) 5 MG tablet Take 5 mg by mouth 2 (two) times daily.    Yes [provider]  metFORMIN (GLUCOPHAGE) 1000 MG tablet Take 1 tablet (1,000 mg total) by mouth 2 (two) times daily with a meal. 05/16/14  Yes Dunn, Dayna N, PA-C  metoprolol tartrate (LOPRESSOR) 25 MG tablet Take 1 tablet (25 mg total) by mouth 2 (two) times daily. 07/29/18  Yes Laurence Slate M, PA-C  pantoprazole (PROTONIX) 40 MG tablet TAKE 1 TABLET TWICE A DAY Patient taking differently: Take 40 mg by mouth 2  (two) times daily.  12/20/18  Yes Lorretta Harp, MD  rosuvastatin (CRESTOR) 20 MG tablet Take 20 mg by mouth every evening.    Yes [provider]     Family History  Problem Relation Age of Onset   Heart disease Mother    Leukemia Mother    Stomach cancer Father    Esophageal cancer Brother    Liver disease Brother    Alcoholism Brother    Leukemia Brother    Leukemia Brother    Diabetes type II Brother    Lung disease Sister     Social History   Socioeconomic History   Marital status: Married    Spouse name: Not on file   Number of children: 2   Years of education: Not on file   Highest education level: Not on file  Occupational History   Occupation: retired    Fish farm manager: RETIRED  Scientist, product/process development strain: Not on file   Food insecurity    Worry: Not on file    Inability: Not on file   Transportation needs    Medical: Not on file    Non-medical: Not on file  Tobacco Use   Smoking status: Former Smoker    Years: 25.00    Quit date: 06/1988    Years since quitting: 30.5   Smokeless tobacco: Never Used  Substance and Sexual Activity   Alcohol use: Yes    Comment: rarely   Drug use: No   Sexual activity: Not on file  Lifestyle   Physical activity    Days per week: Not on file    Minutes per session: Not on file   Stress: Not on file  Relationships   Social connections    Talks on phone: Not on file    Gets together: Not on file    Attends religious service: Not on file    Active member of club or organization: Not on file    Attends meetings of clubs or organizations: Not on file    Relationship status: Not on file  Other Topics Concern   Not on file  Social History Narrative   Not on file     Review of Systems: A 12 point ROS discussed and pertinent positives are indicated in the HPI above.  All other systems are negative.  Review of Systems  Gastrointestinal: Negative for abdominal pain.    Neurological: Negative for weakness.  Psychiatric/Behavioral: Negative for behavioral problems and confusion.    Vital Signs: BP 116/75 (BP Location: Left Arm)    Pulse 96    Temp 97.9 F (36.6 C) (Oral)    Resp 18    Ht 5\' 5"  (1.651 m)    Wt 137 lb 5.6 oz (62.3 kg)    SpO2 99%  BMI 22.86 kg/m   Physical Exam Vitals signs reviewed.  Cardiovascular:     Rate and Rhythm: Normal rate and regular rhythm.     Heart sounds: Normal heart sounds.  Skin:    General: Skin is warm and dry.     Comments: Site of drain is clean and dry NT No bleeding OP bloody bile Minimal in bag  Neurological:     Mental Status: He is alert and oriented to person, place, and time.  Psychiatric:        Behavior: Behavior normal.     Imaging: Ct Abdomen Pelvis W Contrast  Result Date: 01/18/2019 CLINICAL DATA:  Abdominal pain and fevers, known cholecystostomy tube EXAM: CT ABDOMEN AND PELVIS WITH CONTRAST TECHNIQUE: Multidetector CT imaging of the abdomen and pelvis was performed using the standard protocol following bolus administration of intravenous contrast. CONTRAST:  19mL OMNIPAQUE IOHEXOL 300 MG/ML  SOLN COMPARISON:  08/03/2018 FINDINGS: Lower chest: No acute abnormality. Hepatobiliary: Scattered cysts are noted within the liver stable from the prior exam. The gallbladder is decompressed by cholecystostomy tube which is redundant within the gallbladder. No significant dilatation of the gallbladder is seen to suggest tube obstruction. No leakage of fluid is identified. No biliary ductal dilatation is seen. Pancreas: Unremarkable. No pancreatic ductal dilatation or surrounding inflammatory changes. Spleen: Normal in size without focal abnormality. Adrenals/Urinary Tract: Adrenal glands are within normal limits. Kidneys are well visualized bilaterally. Some scarring in the lower pole of the left kidney is again noted and stable. Renal cysts are seen bilaterally. A nonobstructing right renal stone is noted.  No ureteral calculi are seen. The bladder is partially distended. Stomach/Bowel: No obstructive or inflammatory changes of the colon are seen. The appendix is air-filled and within normal limits. No small bowel or gastric abnormality is noted. Vascular/Lymphatic: There are changes consistent with prior aortic bypass. The bypass graft is patent bilaterally previously seen perigraft seromas have resolved in the interval. No significant lymphadenopathy is noted. Reproductive: Prostate is unremarkable. Other: No abdominal wall hernia or abnormality. No abdominopelvic ascites. Musculoskeletal: Degenerative changes of lumbar spine are noted stable from the prior exam. IMPRESSION: Cholecystostomy tube in satisfactory position. No pericholecystic fluid is noted. No findings to suggest tube obstruction are seen. Chronic changes similar to that seen on the prior exam. Resolution of perigraft seromas surrounding the known aortobifemoral bypass graft. Electronically Signed   By: Inez Catalina M.D.   On: 01/18/2019 20:30   Dg Chest Port 1 View  Result Date: 01/18/2019 CLINICAL DATA:  78 year old male with fever. EXAM: PORTABLE CHEST 1 VIEW COMPARISON:  Chest radiograph dated 08/03/2018 FINDINGS: There is no focal consolidation, pleural effusion, or pneumothorax. The cardiac silhouette is within normal limits. No acute osseous pathology. Right shoulder arthroplasty. Right upper quadrant percutaneous pigtail cholecystostomy. IMPRESSION: No active disease. Electronically Signed   By: Anner Crete M.D.   On: 01/18/2019 20:17   Dg Cholangiogram  Existing Tube  Result Date: 01/17/2019 INDICATION: History of acute cholecystitis, post ultrasound fluoroscopic guided cholecystostomy tube placement on 07/19/2018. Patient subsequent underwent percutaneous drainage catheter injection on 08/04/2018 and again on 08/31/2018 however the original cholecystostomy tube has not been exchanged since the time of initial placement. Patient  presents today for percutaneous cholangiogram via existing cholecystostomy tube. EXAM: FLUOROSCOPIC GUIDED CHOLECYSTOSTOMY TUBE INJECTION COMPARISON:  Image guided cholecystostomy tube placement 07/19/2018 cholangiogram via existing cholecystostomy tube-08/31/2018; 08/04/2018; CT abdomen pelvis-08/03/2018 MEDICATIONS: None ANESTHESIA/SEDATION: None CONTRAST:  20 cc Omnipaque 300-administered into the gallbladder fossa. FLUOROSCOPY TIME:  1 minutes, 18 seconds (52 mGy) COMPLICATIONS: None immediate. PROCEDURE: The patient was positioned supine on the fluoroscopy table. A preprocedural spot fluoroscopic image was obtained of the right upper abdominal quadrant existing cholecystostomy tube. Multiple spot fluoroscopic radiographic images were obtained of the right upper abdominal quadrant an existing cholecystostomy tube following injection of a small amount of contrast. Images were reviewed and discussed with the patient. The cholecystostomy tube was flushed with a small amount of saline and capped. A dressing was placed. The patient tolerated the procedure well without immediate postprocedural complication. FINDINGS: Preprocedural spot fluoroscopic image of the right upper abdominal quadrant demonstrates grossly unchanged positioning of the cholecystostomy tube with end coiled and locked overlying the expected location of the gallbladder fundus. Subsequent contrast injection demonstrates appropriate functionality of the cholecystostomy tube with brisk opacification of the gallbladder. There is passage of contrast from the gallbladder through the cystic and common bile ducts the level duodenum. No definitive intraluminal filling defect to suggest the presence of cholelithiasis or choledocholithiasis. IMPRESSION: 1. Appropriately positioned and functioning cholecystostomy tube. 2. No evidence of cholelithiasis or choledocholithiasis. PLAN: - The patient will be scheduled for routine exchange of the cholecystostomy tube  later this week as the tube has not been exchanged since the time of initial placement on 07/19/2018. - if the exchange is uneventful, the cholecystostomy tube will be capped for a trial of internalization. - Assuming the patient passes his trial of internalization/capping, he will return to the interventional radiology drain Clinic 1 week following the exchange/capping for repeat cholangiogram and potential cholecystostomy tube removal. Above discussed and agreed upon with referring surgeon, Dr. Dema Severin. Electronically Signed   By: Sandi Mariscal M.D.   On: 01/17/2019 15:42   Ir Radiologist Eval & Mgmt  Result Date: 01/17/2019 Please refer to notes tab for details about interventional procedure. (Op Note)   Labs:  CBC: Recent Labs    08/05/18 0802 01/18/19 1728 01/19/19 1843 01/20/19 0329  WBC 7.1 13.9* 11.5* 7.4  HGB 8.9* 10.7* 9.2* 8.4*  HCT 27.9* 33.4* 28.2* 26.0*  PLT 397 217 175 152    COAGS: Recent Labs    07/01/18 0926 07/06/18 1600 08/03/18 1848  INR 1.04 1.25 1.05  APTT 30 27  --     BMP: Recent Labs    08/03/18 1220 08/04/18 0256 01/18/19 1728 01/18/19 1840 01/19/19 1843 01/20/19 0329  NA 139  --  134*  --  132* 138  K 4.4  --  5.6*  --  4.7 4.9  CL 103  --  100  --  100 107  CO2 25  --  22  --  21* 21*  GLUCOSE 173*  --  185*  --  141* 108*  BUN 22  --  34*  --  41* 37*  CALCIUM 8.7*  --  9.0  --  8.3* 8.3*  CREATININE 1.91* 1.55* 1.93* 1.80* 2.19* 1.87*  GFRNONAA 33* 43* 33*  --  28* 34*  GFRAA 38* 49* 38*  --  32* 39*    LIVER FUNCTION TESTS: Recent Labs    08/03/18 1220 01/18/19 1728 01/19/19 1843 01/20/19 0329  BILITOT 0.5 0.7 0.5 0.5  AST 33 26 32 25  ALT 30 17 22 21   ALKPHOS 104 90 76 72  PROT 7.0 6.8 6.1* 5.7*  ALBUMIN 2.4* 3.7 3.3* 3.0*    TUMOR MARKERS: No results for input(s): AFPTM, CEA, CA199, CHROMGRNA in the last 8760 hours.  Assessment and Plan:  Perc chole drain  placed 06/2018  Was scheduled for OP perc chole drain  exchange and capping trial today Now IP - +BC Plan to come to IR today for eval and possible capping trial Pt is aware of procedure benefits and risks including but not limited to Infection; bleeding--- agreeable to proceed Consent in chart  Thank you for this interesting consult.  I greatly enjoyed meeting Atreus Hasz and look forward to participating in their care.  A copy of this report was sent to the requesting provider on this date.  Electronically Signed: Lavonia Drafts, PA-C 01/20/2019, 8:25 AM   I spent a total of 20 Minutes    in face to face in clinical consultation, greater than 50% of which was counseling/coordinating care for perc chole drain injection/exchange/capping tiral

## 2019-01-20 NOTE — ED Notes (Signed)
Report given to 6 N RN. All questions answered 

## 2019-01-20 NOTE — Progress Notes (Signed)
Patient ID: Christian Bailey, male   DOB: Mar 16, 1941, 78 y.o.   MRN: [id number]   Discussed case with Dr Gardner Candle best to wait for Monday for procedure +BC - being treated now  Will plan for Monday injection; possible exchange; possible capping trial- after weekend of antibiotic treatment  RN aware

## 2019-01-20 NOTE — H&P (Signed)
History and Physical    Christian Bailey ZOX:[id number] DOB: [birth year] DOA: 01/19/2019  PCP: Prince Solian, MD  Patient coming from: Home  I have personally briefly reviewed patient's old medical records in [place]  Chief Complaint: GNR on BCx  HPI: Christian Bailey is a 78 y.o. male with medical history significant of DM2, HTN, PAD s/p aorto-bifem-bypass in Jan this year.  Acute cholecystitis Post op in Jan s/p cholecystostomy tube for past 7 months was actually scheduled to have change out tomorrow.  Patient presented to the ED yesterday for fever, R sided abdominal pain that started at the entrance point of his cholecystostomy tube.  CT scan at that time showed tube in appropriate position without obvious complications.  Bypass graft also looked fine, previous seromas around bypass graft had resolved.  He was given fluids and tylenol, cultures drawn, despite being persistently tachycardic patient elected to go home.  Today his BCx have come back 2 out of 2 positive for GNR, apparently an enterobacteriacae species.  He was called and asked to return to ED.   ED Course: Patient reports that his abd pain has remained resolved since yesterday and he feels fine / at baseline.  Though he remains tachycardic to 130s s.tach, he says he hasnt had any further symptoms, ate eggs for breakfast today without any difficulty.  Cefepime ordered, 1L bolus ordered, hospitalist asked to admit for GNR bacteremia.   Review of Systems: As per HPI, otherwise all review of systems negative.  Past Medical History:  Diagnosis Date   Anemia    Complication of anesthesia    Diabetes mellitus ([place])    TYPE 2   GERD (gastroesophageal reflux disease)    History of blood transfusion    GI bleed   History of colon polyps    History of hiatal hernia    Hyperlipemia    Hypertension    Osteoarthritis    PAD (peripheral artery disease) ([place])    a. stenting of his left common iliac artery  >20 years ago. b. h/o LEIA stent and 2 stents to R SFA in 2011. c. 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 Noted during admission 04/2014 but upon review seems to be frequent finding for patient.   Stenosis of carotid artery    a. 50% right carotid stenosis by angiogram 04/2014.    Past Surgical History:  Procedure Laterality Date   ABDOMINAL AORTOGRAM W/LOWER EXTREMITY N/A 01/27/2017   Procedure: Abdominal Aortogram w/Lower Extremity;  Surgeon: Serafina Mitchell, MD;  Location: [place] [facility];  Service: Cardiovascular;  Laterality: N/A;   ABDOMINAL AORTOGRAM W/LOWER EXTREMITY N/A 06/08/2017   Procedure: ABDOMINAL AORTOGRAM W/LOWER EXTREMITY;  Surgeon: Serafina Mitchell, MD;  Location: [place] [facility];  Service: Cardiovascular;  Laterality: N/A;   ABDOMINAL AORTOGRAM W/LOWER EXTREMITY N/A 08/31/2017   Procedure: ABDOMINAL AORTOGRAM W/LOWER EXTREMITY;  Surgeon: Serafina Mitchell, MD;  Location: [place] [facility];  Service: Cardiovascular;  Laterality: N/A;  rt. unilateral   ANGIOPLASTY / STENTING FEMORAL     ANGIOPLASTY / STENTING ILIAC     AORTA - BILATERAL FEMORAL ARTERY  BYPASS GRAFT N/A 07/06/2018   Procedure: AORTA BIFEMORAL BYPASS GRAFT USING 14X7MM X 40CM HEMASHIELD GOLD GRAFT;  Surgeon: Serafina Mitchell, MD;  Location: [place];  Service: Vascular;  Laterality: N/A;   CEREBRAL ANGIOGRAM N/A 05/14/2014   Procedure: CEREBRAL ANGIOGRAM;  Surgeon: Lorretta Harp, MD;  [facility];  Service: Cardiovascular;  Laterality: N/A;   COLONOSCOPY W/ POLYPECTOMY     ENDARTERECTOMY Right 09/08/2017   ENDARTERECTOMY FEMORAL Right  09/08/2017   Procedure: REDO RIGHT FEMORAL ENDARTECTOMY WITH PATCH ANGIOPLASTY.;  Surgeon: Serafina Mitchell, MD;  [facility];  Service: Vascular;  Laterality: Right;   EYE SURGERY Bilateral    cataract   FEMORAL ARTERY STENT Right 05/12/2010   Stented distally with a 6x100 Abbott absolute stent and proximally with a 6x60 Cook Zilver stent resulting in the reduction of the proximal segment 80% and mid segment 60-70% to 0% residual, LEFT common femoral artery stented with a 7x3 Smart stent resulting in reduction of 90% stenosis to 0% residual   FEMORAL-POPLITEAL BYPASS GRAFT Right 09/18/2016   Procedure: BYPASS GRAFT FEMORAL-POPLITEAL ARTERY;  Surgeon: Serafina Mitchell, MD;  Location: [place];  Service: Vascular;  Laterality: Right;   FEMORAL-POPLITEAL BYPASS GRAFT Right 09/08/2017   Procedure: REDO BYPASS GRAFT FEMORAL-POPLITEAL ARTERY;  Surgeon: Serafina Mitchell, MD;  Location: [facility];  Service: Vascular;  Laterality: Right;   FEMORAL-POPLITEAL BYPASS GRAFT Right 07/06/2018   Procedure: REVISION RIGHT FEMORAL TO POPLITEAL ARTERY BYPASS GRAFT;  Surgeon: Serafina Mitchell, MD;  Location: [place];  Service: Vascular;  Laterality: Right;   IR CHOLANGIOGRAM EXISTING TUBE  08/04/2018   IR PERC CHOLECYSTOSTOMY  07/19/2018   IR RADIOLOGIST EVAL & MGMT  08/31/2018   IR RADIOLOGIST EVAL & MGMT  01/17/2019   KNEE ARTHROSCOPY     left   LOWER EXTREMITY ANGIOGRAM N/A 05/14/2014   Procedure: LOWER EXTREMITY ANGIOGRAM;  Surgeon: Lorretta Harp, MD;  Location: [facility] CATH LAB;  Service: Cardiovascular;  Laterality: N/A;   LOWER EXTREMITY ANGIOGRAPHY N/A 09/21/2016   Procedure: Lower Extremity Angiography;  Surgeon: Conrad Eureka, MD;  Location: [place] [facility];  Service: Cardiovascular;  Laterality: N/A;   PERIPHERAL VASCULAR ATHERECTOMY Right 01/27/2017   Procedure: PERIPHERAL VASCULAR ATHERECTOMY;  Surgeon: Serafina Mitchell, MD;  Location: [place] [facility];  Service: Cardiovascular;  Laterality: Right;    PERIPHERAL VASCULAR BALLOON ANGIOPLASTY Right 06/08/2017   Procedure: PERIPHERAL VASCULAR BALLOON ANGIOPLASTY;  Surgeon: Serafina Mitchell, MD;  Location: [place] [facility];  Service: Cardiovascular;  Laterality: Right;  common femoral and superficial femoral arteries   PERIPHERAL VASCULAR CATHETERIZATION N/A 04/20/2016   Procedure: Lower Extremity Intervention;  Surgeon: Lorretta Harp, MD;  Location: [place] [facility];  Service: Cardiovascular;  Laterality: N/A;   PERIPHERAL VASCULAR INTERVENTION  08/31/2017   Procedure: PERIPHERAL VASCULAR INTERVENTION;  Surgeon: Serafina Mitchell, MD;  Location: [place] [facility];  Service: Cardiovascular;;  REIA   REVERSE SHOULDER ARTHROPLASTY Right 12/07/2016   REVERSE SHOULDER ARTHROPLASTY Right 12/07/2016   Procedure: REVERSE SHOULDER ARTHROPLASTY;  Surgeon: Netta Cedars, MD;  Location: [place];  Service: Orthopedics;  Laterality: Right;   ROTATOR CUFF REPAIR Right 2003   SFA Right 05/14/2014   PTA  OF RT SFA         DR BERRY     reports that he quit smoking about 30 years ago. He quit after 25.00 years of use. He has never used smokeless tobacco. He reports current  alcohol use. He reports that he does not use drugs.  Allergies  Allergen Reactions   Asa [Aspirin] Other (See Comments)    GI bleeding   Gadolinium Derivatives Itching, Swelling and Other (See Comments)    Pt complained of face flushing/hottness and throat tightness/scratchiness immediately after the injections.  Within 4 minutes, all symptoms were gone and the study was completed.  No further complications or signs of allergy were exhibited after completion of study.    Oxycodone-Acetaminophen Other (See Comments)    Dizziness and feeling of being uncomfortable     Family History  Problem Relation Age of Onset   Heart disease Mother    Leukemia Mother    Stomach cancer Father    Esophageal cancer Brother    Liver disease Brother    Alcoholism Brother    Leukemia  Brother    Leukemia Brother    Diabetes type II Brother    Lung disease Sister      Prior to Admission medications   Medication Sig Start Date End Date Taking? Authorizing Provider  clopidogrel (PLAVIX) 75 MG tablet TAKE 1 TABLET DAILY Patient taking differently: Take 75 mg by mouth daily.  08/29/18  Yes Lorretta Harp, MD  lisinopril (PRINIVIL,ZESTRIL) 5 MG tablet Take 5 mg by mouth 2 (two) times daily.    Yes [provider]  metFORMIN (GLUCOPHAGE) 1000 MG tablet Take 1 tablet (1,000 mg total) by mouth 2 (two) times daily with a meal. 05/16/14  Yes Dunn, Dayna N, PA-C  metoprolol tartrate (LOPRESSOR) 25 MG tablet Take 1 tablet (25 mg total) by mouth 2 (two) times daily. 07/29/18  Yes Laurence Slate M, PA-C  pantoprazole (PROTONIX) 40 MG tablet TAKE 1 TABLET TWICE A DAY Patient taking differently: Take 40 mg by mouth 2 (two) times daily.  12/20/18  Yes Lorretta Harp, MD  rosuvastatin (CRESTOR) 20 MG tablet Take 20 mg by mouth every evening.    Yes [provider]    Physical Exam: Vitals:   01/19/19 2123 01/19/19 2331 01/19/19 2333 01/19/19 2355  BP: (!) 140/57 (!) 156/47    Pulse: (!) 115  66   Resp: 16     Temp: 98.3 F (36.8 C)     TempSrc: Oral     SpO2: 100%  100%   Weight:   61.2 kg 62.1 kg  Height:   5\' 5"  (1.651 m) 5\' 5"  (1.651 m)    Constitutional: NAD, calm, comfortable Eyes: PERRL, lids and conjunctivae normal ENMT: Mucous membranes are moist. Posterior pharynx clear of any exudate or lesions.Normal dentition.  Neck: normal, supple, no masses, no thyromegaly Respiratory: clear to auscultation bilaterally, no wheezing, no crackles. Normal respiratory effort. No accessory muscle use.  Cardiovascular: Tachycardic Abdomen: no tenderness, no masses palpated. No hepatosplenomegaly. Bowel sounds positive.  Musculoskeletal: no clubbing / cyanosis. No joint deformity upper and lower extremities. Good ROM, no contractures. Normal muscle tone.  Skin:  no rashes, lesions, ulcers. No induration Neurologic: CN 2-12 grossly intact. Sensation intact, DTR normal. Strength 5/5 in all 4.  Psychiatric: Normal judgment and insight. Alert and oriented x 3. Normal mood.    Labs on Admission: I have personally reviewed following labs and imaging studies  CBC: Recent Labs  Lab 01/18/19 1728 01/19/19 1843  WBC 13.9* 11.5*  NEUTROABS  --  9.9*  HGB 10.7* 9.2*  HCT 33.4* 28.2*  MCV 92.5 92.5  PLT 217 573   Basic Metabolic Panel: Recent Labs  Lab 01/18/19 1728  01/18/19 1840 01/19/19 1843  NA 134*  --  132*  K 5.6*  --  4.7  CL 100  --  100  CO2 22  --  21*  GLUCOSE 185*  --  141*  BUN 34*  --  41*  CREATININE 1.93* 1.80* 2.19*  CALCIUM 9.0  --  8.3*   GFR: Estimated Creatinine Clearance: 24.6 mL/min (A) (by C-G formula based on SCr of 2.19 mg/dL (H)). Liver Function Tests: Recent Labs  Lab 01/18/19 1728 01/19/19 1843  AST 26 32  ALT 17 22  ALKPHOS 90 76  BILITOT 0.7 0.5  PROT 6.8 6.1*  ALBUMIN 3.7 3.3*   Recent Labs  Lab 01/18/19 1728  LIPASE 25   No results for input(s): AMMONIA in the last 168 hours. Coagulation Profile: No results for input(s): INR, PROTIME in the last 168 hours. Cardiac Enzymes: No results for input(s): CKTOTAL, CKMB, CKMBINDEX, TROPONINI in the last 168 hours. BNP (last 3 results) No results for input(s): PROBNP in the last 8760 hours. HbA1C: No results for input(s): HGBA1C in the last 72 hours. CBG: No results for input(s): GLUCAP in the last 168 hours. Lipid Profile: No results for input(s): CHOL, HDL, LDLCALC, TRIG, CHOLHDL, LDLDIRECT in the last 72 hours. Thyroid Function Tests: No results for input(s): TSH, T4TOTAL, FREET4, T3FREE, THYROIDAB in the last 72 hours. Anemia Panel: No results for input(s): VITAMINB12, FOLATE, FERRITIN, TIBC, IRON, RETICCTPCT in the last 72 hours. Urine analysis:    Component Value Date/Time   COLORURINE YELLOW 01/18/2019 1809   APPEARANCEUR CLEAR  01/18/2019 1809   LABSPEC 1.012 01/18/2019 1809   PHURINE 5.0 01/18/2019 1809   GLUCOSEU NEGATIVE 01/18/2019 1809   HGBUR SMALL (A) 01/18/2019 1809   BILIRUBINUR NEGATIVE 01/18/2019 1809   KETONESUR NEGATIVE 01/18/2019 1809   PROTEINUR NEGATIVE 01/18/2019 1809   NITRITE NEGATIVE 01/18/2019 1809   LEUKOCYTESUR NEGATIVE 01/18/2019 1809    Radiological Exams on Admission: Ct Abdomen Pelvis W Contrast  Result Date: 01/18/2019 CLINICAL DATA:  Abdominal pain and fevers, known cholecystostomy tube EXAM: CT ABDOMEN AND PELVIS WITH CONTRAST TECHNIQUE: Multidetector CT imaging of the abdomen and pelvis was performed using the standard protocol following bolus administration of intravenous contrast. CONTRAST:  123mL OMNIPAQUE IOHEXOL 300 MG/ML  SOLN COMPARISON:  08/03/2018 FINDINGS: Lower chest: No acute abnormality. Hepatobiliary: Scattered cysts are noted within the liver stable from the prior exam. The gallbladder is decompressed by cholecystostomy tube which is redundant within the gallbladder. No significant dilatation of the gallbladder is seen to suggest tube obstruction. No leakage of fluid is identified. No biliary ductal dilatation is seen. Pancreas: Unremarkable. No pancreatic ductal dilatation or surrounding inflammatory changes. Spleen: Normal in size without focal abnormality. Adrenals/Urinary Tract: Adrenal glands are within normal limits. Kidneys are well visualized bilaterally. Some scarring in the lower pole of the left kidney is again noted and stable. Renal cysts are seen bilaterally. A nonobstructing right renal stone is noted. No ureteral calculi are seen. The bladder is partially distended. Stomach/Bowel: No obstructive or inflammatory changes of the colon are seen. The appendix is air-filled and within normal limits. No small bowel or gastric abnormality is noted. Vascular/Lymphatic: There are changes consistent with prior aortic bypass. The bypass graft is patent bilaterally previously  seen perigraft seromas have resolved in the interval. No significant lymphadenopathy is noted. Reproductive: Prostate is unremarkable. Other: No abdominal wall hernia or abnormality. No abdominopelvic ascites. Musculoskeletal: Degenerative changes of lumbar spine are noted stable from the prior exam. IMPRESSION:  Cholecystostomy tube in satisfactory position. No pericholecystic fluid is noted. No findings to suggest tube obstruction are seen. Chronic changes similar to that seen on the prior exam. Resolution of perigraft seromas surrounding the known aortobifemoral bypass graft. Electronically Signed   By: Inez Catalina M.D.   On: 01/18/2019 20:30   Dg Chest Port 1 View  Result Date: 01/18/2019 CLINICAL DATA:  78 year old male with fever. EXAM: PORTABLE CHEST 1 VIEW COMPARISON:  Chest radiograph dated 08/03/2018 FINDINGS: There is no focal consolidation, pleural effusion, or pneumothorax. The cardiac silhouette is within normal limits. No acute osseous pathology. Right shoulder arthroplasty. Right upper quadrant percutaneous pigtail cholecystostomy. IMPRESSION: No active disease. Electronically Signed   By: Anner Crete M.D.   On: 01/18/2019 20:17    EKG: Independently reviewed.  Assessment/Plan Principal Problem:   Bacteremia due to Gram-negative bacteria Active Problems:   Essential hypertension   Type 2 diabetes mellitus (HCC)   CKD (chronic kidney disease) stage 3, GFR 30-59 ml/min (HCC)    1. GNR bacteremia - 1. Suspected biliary drain source given HPI 2. Less likely (but more worrisome if infected) would be the aorto-bifem-bypass graft 3. Repeat BCx x1 4. Culture drain output 5. Cefepime 6. IVF: 1L bolus and 100 cc/hr NS 7. Repeat CBC/CMP in AM 8. IR to evaluate drain 9. Call ID and/or vasc surg in AM regarding if theres anything else we need to do to rule out bypass graft infection. 10. Tele monitor for tachycardia 2. DM2 - 1. Hold metformin 2. Sensitive SSI AC 3. HTN  - 1. Holding lisinopril 2. Holding metoprolol 4. CKD stage 3 - 1. Chronic, maybe slightly above baseline 2. Repeat CMP in AM 3. IVF as above 4. Holding lisinopril  DVT prophylaxis: Lovenox Code Status: Full Family Communication: No family in room Disposition Plan: Home after admit Consults called: IR consult put into Epic Admission status: Admit to inpatient  Severity of Illness: The appropriate patient status for this patient is INPATIENT. Inpatient status is judged to be reasonable and necessary in order to provide the required intensity of service to ensure the patient's safety. The patient's presenting symptoms, physical exam findings, and initial radiographic and laboratory data in the context of their chronic comorbidities is felt to place them at high risk for further clinical deterioration. Furthermore, it is not anticipated that the patient will be medically stable for discharge from the hospital within 2 midnights of admission. The following factors support the patient status of inpatient.   IP status for treatment of GNR bacteremia.   * I certify that at the point of admission it is my clinical judgment that the patient will require inpatient hospital care spanning beyond 2 midnights from the point of admission due to high intensity of service, high risk for further deterioration and high frequency of surveillance required.*    Brena Windsor M. DO [facility]  How to contact the [facility] Attending or Consulting provider [place] or covering provider during after hours [place], for this patient?  1. Check the care team in [facility] and look for a) attending/consulting [facility] provider listed and b) the [facility] team listed 2. Log into [email or web address]  Amion Physician Scheduling and messaging for groups and whole hospitals  On call and physician scheduling software for group practices, residents, hospitalists and other medical providers for call, clinic, rotation and shift schedules. OnCall  [employer] is a hospital-wide system for scheduling doctors and paging doctors on call. EasyPlot is for scientific plotting and data  analysis.  [email or web address]  and use [place]'s universal password to access. If you do not have the password, please contact the hospital operator.  3. Locate the [facility] provider you are looking for under [facility] and page to a number that you can be directly reached. 4. If you still have difficulty reaching the provider, please page the [facility] (Director on Call) for the Hospitalists listed on amion for assistance.  01/20/2019, 12:02 AM

## 2019-01-21 LAB — CBC
HCT: 26.8 % — ABNORMAL LOW (ref 39.0–52.0)
Hemoglobin: 8.8 g/dL — ABNORMAL LOW (ref 13.0–17.0)
MCH: 29.7 pg (ref 26.0–34.0)
MCHC: 32.8 g/dL (ref 30.0–36.0)
MCV: 90.5 fL (ref 80.0–100.0)
Platelets: 160 10*3/uL (ref 150–400)
RBC: 2.96 MIL/uL — ABNORMAL LOW (ref 4.22–5.81)
RDW: 13.8 % (ref 11.5–15.5)
WBC: 5.4 10*3/uL (ref 4.0–10.5)
nRBC: 0 % (ref 0.0–0.2)

## 2019-01-21 LAB — CULTURE, BLOOD (ROUTINE X 2): Special Requests: ADEQUATE

## 2019-01-21 LAB — GLUCOSE, CAPILLARY
Glucose-Capillary: 221 mg/dL — ABNORMAL HIGH (ref 70–99)
Glucose-Capillary: 117 mg/dL — ABNORMAL HIGH (ref 70–99)
Glucose-Capillary: 143 mg/dL — ABNORMAL HIGH (ref 70–99)
Glucose-Capillary: 172 mg/dL — ABNORMAL HIGH (ref 70–99)

## 2019-01-21 LAB — BASIC METABOLIC PANEL
Anion gap: 11 (ref 5–15)
BUN: 26 mg/dL — ABNORMAL HIGH (ref 8–23)
CO2: 20 mmol/L — ABNORMAL LOW (ref 22–32)
Calcium: 8.6 mg/dL — ABNORMAL LOW (ref 8.9–10.3)
Chloride: 107 mmol/L (ref 98–111)
Creatinine, Ser: 1.59 mg/dL — ABNORMAL HIGH (ref 0.61–1.24)
GFR calc Af Amer: 48 mL/min — ABNORMAL LOW (ref >60)
GFR calc non Af Amer: 41 mL/min — ABNORMAL LOW (ref >60)
Glucose, Bld: 111 mg/dL — ABNORMAL HIGH (ref 70–99)
Potassium: 4.2 mmol/L (ref 3.5–5.1)
Sodium: 138 mmol/L (ref 135–145)

## 2019-01-21 MED ORDER — SODIUM CHLORIDE 0.9 % IV SOLN
2.0000 g | Freq: Two times a day (BID) | INTRAVENOUS | Status: DC
  Administered 2019-01-21 (×2): 2 g via INTRAVENOUS
  Filled 2019-01-21 (×5): qty 2

## 2019-01-21 NOTE — Plan of Care (Signed)

## 2019-01-21 NOTE — Progress Notes (Signed)
PROGRESS NOTE [facility]   Braidyn Scorsone   FXT:[id number] DOB: Oct 24, 1940  DOA: 01/19/2019 PCP: Prince Solian, MD   Brief Narrative:  Christian Bailey is a 78 year old male with medical history significant for diabetes type 2, hypertension, PAD status post aorto bifemoral bypass in January 2020.  He developed acute cholecystitis postop in January and is a status post cholecystostomy tube for the past 7 months which was scheduled for capping tomorrow.  Patient presented to the ED with fevers, right side abdominal pain, CT scan show appropriate position of the tube without obvious complications.  Blood cultures were drawn which came back positive for GNR 2/2.  Patient was started on cefepime and admitted for IR evaluation and possibly changing gallbladder catheter.  Subjective: Patient seen and examined, he denies any abdominal pain, fevers, nausea and vomiting.  IR did not change his catheter, awaiting for antibiotic therapy and sensitivities.  Planning to the procedure on Monday.  No other concerns at this time.  Assessment & Plan:   Principal Problem:   Bacteremia due to Gram-negative bacteria Active Problems:   Essential hypertension   Type 2 diabetes mellitus (HCC)   CKD (chronic kidney disease) stage 3, GFR 30-59 ml/min (HCC)  Bacteremia due to GNR Suspect from biliary drain, currently on cefepime and improving.  IR following for evaluation of drain.  Doubt that this is coming from his aortobifemoral bypass graft.  Continue to monitor CBC and CMP.  Sensitivities pending.  Hypertension BP stable, continue metoprolol, given slight elevated creatinine we will continue to hold lisinopril.  Diabetes mellitus type 2. Stable, continue insulin sliding scale.  CKD stage III Chronic, above baseline.  Continue to monitor renal function.  Hold lisinopril.  Encourage oral hydration.  Avoid nephrotoxic agents.  DVT prophylaxis: Lovenox Code Status: Full code Family Communication:  None at bedside Disposition Plan: Pending IR procedure.   Consultants:   IR  Procedures:     Antimicrobials:  Cefepime   Objective: Vitals:   01/20/19 1654 01/20/19 2105 01/21/19 0015 01/21/19 0528  BP: 110/62 124/65  127/69  Pulse: 91 96  (!) 114  Resp: 18 17  18   Temp: 98 F (36.7 C) 98.6 F (37 C) 98.1 F (36.7 C) 97.8 F (36.6 C)  TempSrc: Oral Oral Oral Oral  SpO2: 100% 100%  97%  Weight:      Height:        Intake/Output Summary (Last 24 hours) at 01/21/2019 1001 Last data filed at 01/21/2019 0816 Gross per 24 hour  Intake 860 ml  Output 2635 ml  Net -1775 ml   Filed Weights   01/19/19 2333 01/19/19 2355 01/20/19 0053  Weight: 61.2 kg 62.1 kg 62.3 kg    Examination:  General exam: Appears calm and comfortable  HEENT: AC/AT, PERRLA, OP moist and clear Respiratory system: Clear to auscultation. No wheezes,crackle or rhonchi Cardiovascular system: S1 & S2 heard, tachycardic. No JVD, murmurs, rubs or gallops Gastrointestinal system: Abdomen is nondistended, soft and nontender. No organomegaly or masses felt. Normal bowel sounds heard. Central nervous system: Alert and oriented. No focal neurological deficits. Extremities: No pedal edema. Symmetric, strength 5/5   Skin: No rashes, lesions or ulcers Psychiatry: Judgement and insight appear normal. Mood & affect appropriate.    Data Reviewed: I have personally reviewed following labs and imaging studies  CBC: Recent Labs  Lab 01/18/19 1728 01/19/19 1843 01/20/19 0329 01/21/19 0317  WBC 13.9* 11.5* 7.4 5.4  NEUTROABS  --  9.9*  --   --  HGB 10.7* 9.2* 8.4* 8.8*  HCT 33.4* 28.2* 26.0* 26.8*  MCV 92.5 92.5 92.5 90.5  PLT 217 175 152 025   Basic Metabolic Panel: Recent Labs  Lab 01/18/19 1728 01/18/19 1840 01/19/19 1843 01/20/19 0329 01/21/19 0317  NA 134*  --  132* 138 138  K 5.6*  --  4.7 4.9 4.2  CL 100  --  100 107 107  CO2 22  --  21* 21* 20*  GLUCOSE 185*  --  141* 108* 111*  BUN  34*  --  41* 37* 26*  CREATININE 1.93* 1.80* 2.19* 1.87* 1.59*  CALCIUM 9.0  --  8.3* 8.3* 8.6*   GFR: Estimated Creatinine Clearance: 33.8 mL/min (A) (by C-G formula based on SCr of 1.59 mg/dL (H)). Liver Function Tests: Recent Labs  Lab 01/18/19 1728 01/19/19 1843 01/20/19 0329  AST 26 32 25  ALT 17 22 21   ALKPHOS 90 76 72  BILITOT 0.7 0.5 0.5  PROT 6.8 6.1* 5.7*  ALBUMIN 3.7 3.3* 3.0*   Recent Labs  Lab 01/18/19 1728  LIPASE 25   No results for input(s): AMMONIA in the last 168 hours. Coagulation Profile: No results for input(s): INR, PROTIME in the last 168 hours. Cardiac Enzymes: No results for input(s): CKTOTAL, CKMB, CKMBINDEX, TROPONINI in the last 168 hours. BNP (last 3 results) No results for input(s): PROBNP in the last 8760 hours. HbA1C: Recent Labs    01/20/19 0023  HGBA1C 5.9*   CBG: Recent Labs  Lab 01/20/19 0800 01/20/19 1222 01/20/19 1652 01/20/19 2058 01/21/19 0813  GLUCAP 107* 121* 102* 175* 221*   Lipid Profile: No results for input(s): CHOL, HDL, LDLCALC, TRIG, CHOLHDL, LDLDIRECT in the last 72 hours. Thyroid Function Tests: No results for input(s): TSH, T4TOTAL, FREET4, T3FREE, THYROIDAB in the last 72 hours. Anemia Panel: No results for input(s): VITAMINB12, FOLATE, FERRITIN, TIBC, IRON, RETICCTPCT in the last 72 hours. Sepsis Labs: Recent Labs  Lab 01/19/19 1843 01/19/19 2014  LATICACIDVEN 2.1* 1.6    Recent Results (from the past 240 hour(s))  Blood culture (routine x 2)     Status: Abnormal   Collection Time: 01/18/19  6:30 PM   Specimen: BLOOD  Result Value Ref Range Status   Specimen Description BLOOD RIGHT ANTECUBITAL  Final   Special Requests   Final    BOTTLES DRAWN AEROBIC AND ANAEROBIC Blood Culture adequate volume   Culture  Setup Time   Final    GRAM NEGATIVE RODS IN BOTH AEROBIC AND ANAEROBIC BOTTLES CRITICAL VALUE NOTED.  VALUE IS CONSISTENT WITH PREVIOUSLY REPORTED AND CALLED VALUE.    Culture (A)  Final     CITROBACTER KOSERI SUSCEPTIBILITIES PERFORMED ON PREVIOUS CULTURE WITHIN THE LAST 5 DAYS. Performed at [facility], [place] [address], [place], [place] [address]    Report Status 01/21/2019 FINAL  Final  SARS Coronavirus 2 (CEPHEID - Performed in [place] hospital lab), Hosp Order     Status: None   Collection Time: 01/18/19  6:38 PM   Specimen: Nasopharyngeal Swab  Result Value Ref Range Status   SARS Coronavirus 2 NEGATIVE NEGATIVE Final    Comment: (NOTE) If result is NEGATIVE SARS-CoV-2 target nucleic acids are NOT DETECTED. The SARS-CoV-2 RNA is generally detectable in upper and lower  respiratory specimens during the acute phase of infection. The lowest  concentration of SARS-CoV-2 viral copies this assay can detect is 250  copies / mL. A negative result does not preclude SARS-CoV-2 infection  and should  not be used as the sole basis for treatment or other  patient management decisions.  A negative result may occur with  improper specimen collection / handling, submission of specimen other  than nasopharyngeal swab, presence of viral mutation(s) within the  areas targeted by this assay, and inadequate number of viral copies  (<250 copies / mL). A negative result must be combined with clinical  observations, patient history, and epidemiological information. If result is POSITIVE SARS-CoV-2 target nucleic acids are DETECTED. The SARS-CoV-2 RNA is generally detectable in upper and lower  respiratory specimens dur ing the acute phase of infection.  Positive  results are indicative of active infection with SARS-CoV-2.  Clinical  correlation with patient history and other diagnostic information is  necessary to determine patient infection status.  Positive results do  not rule out bacterial infection or co-infection with other viruses. If result is PRESUMPTIVE POSTIVE SARS-CoV-2 nucleic acids MAY BE PRESENT.   A presumptive positive result was obtained on the submitted  specimen  and confirmed on repeat testing.  While 2019 novel coronavirus  (SARS-CoV-2) nucleic acids may be present in the submitted sample  additional confirmatory testing may be necessary for epidemiological  and / or clinical management purposes  to differentiate between  SARS-CoV-2 and other Sarbecovirus currently known to infect humans.  If clinically indicated additional testing with an alternate test  methodology [phone]) is advised. The SARS-CoV-2 RNA is generally  detectable in upper and lower respiratory sp ecimens during the acute  phase of infection. The expected result is Negative. Fact Sheet for Patients:  StrictlyIdeas.no Fact Sheet for Healthcare Providers: BankingDealers.co.za This test is not yet approved or cleared by the [place] FDA and has been authorized for detection and/or diagnosis of SARS-CoV-2 by FDA under an Emergency Use Authorization (EUA).  This EUA will remain in effect (meaning this test can be used) for the duration of the COVID-19 declaration under Section 564(b)(1) of the Act, 21 U.S.C. section 360bbb-3(b)(1), unless the authorization is terminated or revoked sooner. Performed at [facility], [place] [address], [place], [place] [address]   Blood culture (routine x 2)     Status: Abnormal   Collection Time: 01/18/19  7:01 PM   Specimen: BLOOD  Result Value Ref Range Status   Specimen Description BLOOD LEFT ANTECUBITAL  Final   Special Requests   Final    BOTTLES DRAWN AEROBIC AND ANAEROBIC Blood Culture results may not be optimal due to an inadequate volume of blood received in culture bottles   Culture  Setup Time   Final    GRAM NEGATIVE RODS IN BOTH AEROBIC AND ANAEROBIC BOTTLES CRITICAL RESULT CALLED TO, READ BACK BY AND VERIFIED WITH: RN Jerilynn Mages SIMMS [phone] MLM Performed at [facility], [place] [address], [place], [place] [address]    Culture CITROBACTER KOSERI (A)  Final    Report Status 01/21/2019 FINAL  Final   Organism ID, Bacteria CITROBACTER KOSERI  Final      Susceptibility   Citrobacter koseri - MIC*    CEFAZOLIN <=4 SENSITIVE Sensitive     CEFEPIME <=1 SENSITIVE Sensitive     CEFTAZIDIME <=1 SENSITIVE Sensitive     CEFTRIAXONE <=1 SENSITIVE Sensitive     CIPROFLOXACIN <=0.25 SENSITIVE Sensitive     GENTAMICIN <=1 SENSITIVE Sensitive     IMIPENEM <=0.25 SENSITIVE Sensitive     TRIMETH/SULFA <=20 SENSITIVE Sensitive     PIP/TAZO <=4 SENSITIVE Sensitive     * CITROBACTER KOSERI  Blood Culture ID Panel (Reflexed)     Status: Abnormal   Collection Time: 01/18/19  7:01 PM  Result Value Ref Range Status   Enterococcus species NOT DETECTED NOT DETECTED Final   Listeria monocytogenes NOT DETECTED NOT DETECTED Final   Staphylococcus species NOT DETECTED NOT DETECTED Final   Staphylococcus aureus (BCID) NOT DETECTED NOT DETECTED Final   Streptococcus species NOT DETECTED NOT DETECTED Final   Streptococcus agalactiae NOT DETECTED NOT DETECTED Final   Streptococcus pneumoniae NOT DETECTED NOT DETECTED Final   Streptococcus pyogenes NOT DETECTED NOT DETECTED Final   Acinetobacter baumannii NOT DETECTED NOT DETECTED Final   Enterobacteriaceae species DETECTED (A) NOT DETECTED Final    Comment: Enterobacteriaceae represent a large family of gram negative bacteria, not a single organism. Refer to culture for further identification. CRITICAL RESULT CALLED TO, READ BACK BY AND VERIFIED WITH: RN M SIMMS [phone] MLM    Enterobacter cloacae complex NOT DETECTED NOT DETECTED Final   Escherichia coli NOT DETECTED NOT DETECTED Final   Klebsiella oxytoca NOT DETECTED NOT DETECTED Final   Klebsiella pneumoniae NOT DETECTED NOT DETECTED Final   Proteus species NOT DETECTED NOT DETECTED Final   Serratia marcescens NOT DETECTED NOT DETECTED Final   Carbapenem resistance NOT DETECTED NOT DETECTED Final   Haemophilus influenzae NOT DETECTED NOT DETECTED Final    Neisseria meningitidis NOT DETECTED NOT DETECTED Final   Pseudomonas aeruginosa NOT DETECTED NOT DETECTED Final   Candida albicans NOT DETECTED NOT DETECTED Final   Candida glabrata NOT DETECTED NOT DETECTED Final   Candida krusei NOT DETECTED NOT DETECTED Final   Candida parapsilosis NOT DETECTED NOT DETECTED Final   Candida tropicalis NOT DETECTED NOT DETECTED Final    Comment: Performed at [facility], [place] [address], [place], [place] [address]  Blood culture (routine x 2)     Status: None (Preliminary result)   Collection Time: 01/19/19 11:36 PM   Specimen: BLOOD  Result Value Ref Range Status   Specimen Description BLOOD LEFT ANTECUBITAL  Final   Special Requests   Final    BOTTLES DRAWN AEROBIC AND ANAEROBIC Blood Culture adequate volume   Culture   Final    NO GROWTH 1 DAY Performed at [facility], [place] [address], [place], [place] [address]    Report Status PENDING  Incomplete  Blood culture (routine x 2)     Status: None (Preliminary result)   Collection Time: 01/19/19 11:50 PM   Specimen: BLOOD  Result Value Ref Range Status   Specimen Description BLOOD RIGHT ANTECUBITAL  Final   Special Requests   Final    BOTTLES DRAWN AEROBIC AND ANAEROBIC Blood Culture adequate volume   Culture   Final    NO GROWTH 1 DAY Performed at [facility], [place] [address] [place],  [address]    Report Status PENDING  Incomplete  SARS Coronavirus 2 (CEPHEID - Performed in [place] hospital lab), Hosp Order     Status: None   Collection Time: 01/20/19 12:33 AM   Specimen: Nasopharyngeal Swab  Result Value Ref Range Status   SARS Coronavirus 2 NEGATIVE NEGATIVE Final    Comment: (NOTE) If result is NEGATIVE SARS-CoV-2 target nucleic acids are NOT DETECTED. The SARS-CoV-2 RNA is generally detectable in upper and lower  respiratory specimens during the acute phase of infection. The lowest  concentration of SARS-CoV-2 viral copies this assay can detect is 250   copies / mL. A negative result does  not preclude SARS-CoV-2 infection  and should not be used as the sole basis for treatment or other  patient management decisions.  A negative result may occur with  improper specimen collection / handling, submission of specimen other  than nasopharyngeal swab, presence of viral mutation(s) within the  areas targeted by this assay, and inadequate number of viral copies  (<250 copies / mL). A negative result must be combined with clinical  observations, patient history, and epidemiological information. If result is POSITIVE SARS-CoV-2 target nucleic acids are DETECTED. The SARS-CoV-2 RNA is generally detectable in upper and lower  respiratory specimens dur ing the acute phase of infection.  Positive  results are indicative of active infection with SARS-CoV-2.  Clinical  correlation with patient history and other diagnostic information is  necessary to determine patient infection status.  Positive results do  not rule out bacterial infection or co-infection with other viruses. If result is PRESUMPTIVE POSTIVE SARS-CoV-2 nucleic acids MAY BE PRESENT.   A presumptive positive result was obtained on the submitted specimen  and confirmed on repeat testing.  While 2019 novel coronavirus  (SARS-CoV-2) nucleic acids may be present in the submitted sample  additional confirmatory testing may be necessary for epidemiological  and / or clinical management purposes  to differentiate between  SARS-CoV-2 and other Sarbecovirus currently known to infect humans.  If clinically indicated additional testing with an alternate test  methodology [phone]) is advised. The SARS-CoV-2 RNA is generally  detectable in upper and lower respiratory sp ecimens during the acute  phase of infection. The expected result is Negative. Fact Sheet for Patients:  StrictlyIdeas.no Fact Sheet for Healthcare Providers: BankingDealers.co.za  This test is not yet approved or cleared by the [place] FDA and has been authorized for detection and/or diagnosis of SARS-CoV-2 by FDA under an Emergency Use Authorization (EUA).  This EUA will remain in effect (meaning this test can be used) for the duration of the COVID-19 declaration under Section 564(b)(1) of the Act, 21 U.S.C. section 360bbb-3(b)(1), unless the authorization is terminated or revoked sooner. Performed at [facility], [place] [address], [place], [place] [address]   Culture, blood (single)     Status: None (Preliminary result)   Collection Time: 01/20/19  3:29 AM   Specimen: BLOOD  Result Value Ref Range Status   Specimen Description BLOOD RIGHT ANTECUBITAL  Final   Special Requests   Final    BOTTLES DRAWN AEROBIC AND ANAEROBIC Blood Culture results may not be optimal due to an inadequate volume of blood received in culture bottles   Culture   Final    NO GROWTH 1 DAY Performed at [facility], [place] [address], [place], [place] [address]    Report Status PENDING  Incomplete      Radiology Studies: No results found.    Scheduled Meds: . clopidogrel  75 mg Oral Daily  . heparin  5,000 Units Subcutaneous Q8H  . insulin aspart  0-9 Units Subcutaneous TID WC  . metoprolol tartrate  25 mg Oral BID  . pantoprazole  40 mg Oral BID  . rosuvastatin  20 mg Oral QPM  . sodium chloride flush  3 mL Intravenous Once   Continuous Infusions: . ceFEPime (MAXIPIME) IV 2 g (01/20/19 2121)     LOS: 2 days    Time spent: Total of 35 minutes spent with pt, greater than 50% of which was spent in discussion of  treatment, counseling and coordination of care   Christean Grief  Quincy Simmonds, MD  How to contact the [facility] Attending or Consulting provider [place] or covering provider during after hours [place], for this patient?  1. Check the care team in [facility] and look for a) attending/consulting [facility] provider listed and b) the [facility] team listed 2. Log into [email or web address] and use  [facility]'s universal password to access. If you do not have the password, please contact the hospital operator. 3. Locate the [facility] provider you are looking for under [facility] and page to a number that you can be directly reached. 4. If you still have difficulty reaching the provider, please page the [facility] (Director on Call) for the Hospitalists listed on amion for assistance.

## 2019-01-21 NOTE — Progress Notes (Signed)
PHARMACY NOTE:  ANTIMICROBIAL RENAL DOSAGE ADJUSTMENT  Current antimicrobial regimen includes a mismatch between antimicrobial dosage and estimated renal function.  As per policy approved by the Pharmacy & Therapeutics and Medical Executive Committees, the antimicrobial dosage will be adjusted accordingly.  Current antimicrobial dosage:  Cefepime 2 gm IV q24h   Indication: Enterobacteriaceae  Renal Function: Improving Scr 2.19>1.87>1.59 Estimated Creatinine Clearance: 33.8 mL/min (A) (by C-G formula based on SCr of 1.59 mg/dL (H)).  Antimicrobial dosage has been changed to: Cefepime 2 gm IV q12h  Thank you for allowing pharmacy to be a part of this patient's care.  Acey Lav, PharmD  PGY1 Pharmacy Resident [facility] [phone] 01/21/2019 10:59 AM

## 2019-01-22 ENCOUNTER — Inpatient Hospital Stay (HOSPITAL_COMMUNITY): Payer: Medicare Other

## 2019-01-22 ENCOUNTER — Encounter (HOSPITAL_COMMUNITY): Payer: Self-pay | Admitting: Interventional Radiology

## 2019-01-22 HISTORY — PX: IR EXCHANGE BILIARY DRAIN: IMG6046

## 2019-01-22 LAB — BASIC METABOLIC PANEL
Anion gap: 10 (ref 5–15)
BUN: 18 mg/dL (ref 8–23)
CO2: 22 mmol/L (ref 22–32)
Calcium: 8.7 mg/dL — ABNORMAL LOW (ref 8.9–10.3)
Chloride: 107 mmol/L (ref 98–111)
Creatinine, Ser: 1.58 mg/dL — ABNORMAL HIGH (ref 0.61–1.24)
GFR calc Af Amer: 48 mL/min — ABNORMAL LOW (ref >60)
GFR calc non Af Amer: 42 mL/min — ABNORMAL LOW (ref >60)
Glucose, Bld: 127 mg/dL — ABNORMAL HIGH (ref 70–99)
Potassium: 4.1 mmol/L (ref 3.5–5.1)
Sodium: 139 mmol/L (ref 135–145)

## 2019-01-22 LAB — CBC
HCT: 26.8 % — ABNORMAL LOW (ref 39.0–52.0)
Hemoglobin: 8.9 g/dL — ABNORMAL LOW (ref 13.0–17.0)
MCH: 30.1 pg (ref 26.0–34.0)
MCHC: 33.2 g/dL (ref 30.0–36.0)
MCV: 90.5 fL (ref 80.0–100.0)
Platelets: 179 10*3/uL (ref 150–400)
RBC: 2.96 MIL/uL — ABNORMAL LOW (ref 4.22–5.81)
RDW: 13.6 % (ref 11.5–15.5)
WBC: 5.3 10*3/uL (ref 4.0–10.5)
nRBC: 0 % (ref 0.0–0.2)

## 2019-01-22 LAB — GLUCOSE, CAPILLARY
Glucose-Capillary: 120 mg/dL — ABNORMAL HIGH (ref 70–99)
Glucose-Capillary: 113 mg/dL — ABNORMAL HIGH (ref 70–99)
Glucose-Capillary: 185 mg/dL — ABNORMAL HIGH (ref 70–99)
Glucose-Capillary: 106 mg/dL — ABNORMAL HIGH (ref 70–99)

## 2019-01-22 MED ORDER — SODIUM CHLORIDE 0.9 % IV SOLN
2.0000 g | INTRAVENOUS | Status: DC
  Administered 2019-01-22 – 2019-01-24 (×3): 2 g via INTRAVENOUS
  Filled 2019-01-22: qty 2
  Filled 2019-01-22: qty 20
  Filled 2019-01-22: qty 2

## 2019-01-22 MED ORDER — LIDOCAINE HCL 1 % IJ SOLN
INTRAMUSCULAR | Status: AC
  Filled 2019-01-22: qty 20

## 2019-01-22 MED ORDER — IOHEXOL 300 MG/ML  SOLN
50.0000 mL | Freq: Once | INTRAMUSCULAR | Status: AC | PRN
  Administered 2019-01-22: 13:00:00 10 mL

## 2019-01-22 MED ORDER — LIDOCAINE HCL (PF) 1 % IJ SOLN
INTRAMUSCULAR | Status: DC | PRN
  Administered 2019-01-22: 5 mL

## 2019-01-22 NOTE — Progress Notes (Signed)
PROGRESS NOTE [facility]   [name]   QJJ:[id number] DOB: [birth year]  DOA: 01/19/2019 PCP: Prince Solian, MD   Brief Narrative:  Christian Bailey is a 78 year old male with medical history significant for diabetes type 2, hypertension, PAD status post aorto bifemoral bypass in January 2020.  He developed acute cholecystitis postop in January and is a status post cholecystostomy tube for the past 7 months which was scheduled for capping tomorrow.  Patient presented to the ED with fevers, right side abdominal pain, CT scan show appropriate position of the tube without obvious complications.  Blood cultures were drawn which came back positive for GNR 2/2.  Patient was started on cefepime and admitted for IR evaluation and possibly changing gallbladder catheter.  Subjective: Patient seen and examined, he denies any abdominal pain, fevers, nausea and vomiting.  Tolerated IR tube exchange well.  Assessment & Plan:   Principal Problem:   Bacteremia due to Gram-negative bacteria Active Problems:   Essential hypertension   Type 2 diabetes mellitus (HCC)   CKD (chronic kidney disease) stage 3, GFR 30-59 ml/min (HCC)  Sepsis secondary to E. coli bacteremia.  POA Chronic cholecystitis. S/P cholecystostomy tube drain placement. Suspect from biliary drain, Initially on cefepime and improving.  Blood cultures positive for E. coli. IR consulted for evaluation of drain.  Underwent drain exchange on 01/22/2019. Sepsis physiology resolved.  Hypertension BP stable, continue metoprolol, given slight elevated creatinine we will continue to hold lisinopril.  Diabetes mellitus type 2.  Controlled with renal complication Stable, continue insulin sliding scale.  CKD stage III Chronic, above baseline.  Continue to monitor renal function.  Hold lisinopril.  Encourage oral hydration.  Avoid nephrotoxic agents.  DVT prophylaxis: Lovenox Code Status: Full code Family Communication: None at  bedside Disposition Plan: Pending IR procedure.   Consultants:   IR  Procedures:     Antimicrobials:  Cefepime   Objective: Vitals:   01/21/19 2200 01/22/19 0453 01/22/19 0514 01/22/19 1312  BP:  118/70  120/61  Pulse: 96 (!) 114 100 100  Resp:  18  17  Temp:  99.3 F (37.4 C)  97.8 F (36.6 C)  TempSrc:  Oral  Oral  SpO2:  96%  98%  Weight:      Height:        Intake/Output Summary (Last 24 hours) at 01/22/2019 1720 Last data filed at 01/22/2019 1720 Gross per 24 hour  Intake 1244 ml  Output 2100 ml  Net -856 ml   Filed Weights   01/19/19 2333 01/19/19 2355 01/20/19 0053  Weight: 61.2 kg 62.1 kg 62.3 kg    Examination:  General exam: Appears calm and comfortable  HEENT: AC/AT, PERRLA, OP moist and clear Respiratory system: Clear to auscultation. No wheezes,crackle or rhonchi Cardiovascular system: S1 & S2 heard, tachycardic. No JVD, murmurs, rubs or gallops Gastrointestinal system: Abdomen is nondistended, soft and nontender. No organomegaly or masses felt. Normal bowel sounds heard. Central nervous system: Alert and oriented. No focal neurological deficits. Extremities: No pedal edema. Symmetric, strength 5/5   Skin: No rashes, lesions or ulcers Psychiatry: Judgement and insight appear normal. Mood & affect appropriate.    Data Reviewed: I have personally reviewed following labs and imaging studies  CBC: Recent Labs  Lab 01/18/19 1728 01/19/19 1843 01/20/19 0329 01/21/19 0317 01/22/19 0224  WBC 13.9* 11.5* 7.4 5.4 5.3  NEUTROABS  --  9.9*  --   --   --   HGB 10.7* 9.2* 8.4* 8.8* 8.9*  HCT 33.4*  28.2* 26.0* 26.8* 26.8*  MCV 92.5 92.5 92.5 90.5 90.5  PLT 217 175 152 160 093   Basic Metabolic Panel: Recent Labs  Lab 01/18/19 1728 01/18/19 1840 01/19/19 1843 01/20/19 0329 01/21/19 0317 01/22/19 0224  NA 134*  --  132* 138 138 139  K 5.6*  --  4.7 4.9 4.2 4.1  CL 100  --  100 107 107 107  CO2 22  --  21* 21* 20* 22  GLUCOSE 185*  --   141* 108* 111* 127*  BUN 34*  --  41* 37* 26* 18  CREATININE 1.93* 1.80* 2.19* 1.87* 1.59* 1.58*  CALCIUM 9.0  --  8.3* 8.3* 8.6* 8.7*   GFR: Estimated Creatinine Clearance: 34.1 mL/min (A) (by C-G formula based on SCr of 1.58 mg/dL (H)). Liver Function Tests: Recent Labs  Lab 01/18/19 1728 01/19/19 1843 01/20/19 0329  AST 26 32 25  ALT 17 22 21   ALKPHOS 90 76 72  BILITOT 0.7 0.5 0.5  PROT 6.8 6.1* 5.7*  ALBUMIN 3.7 3.3* 3.0*   Recent Labs  Lab 01/18/19 1728  LIPASE 25   No results for input(s): AMMONIA in the last 168 hours. Coagulation Profile: No results for input(s): INR, PROTIME in the last 168 hours. Cardiac Enzymes: No results for input(s): CKTOTAL, CKMB, CKMBINDEX, TROPONINI in the last 168 hours. BNP (last 3 results) No results for input(s): PROBNP in the last 8760 hours. HbA1C: Recent Labs    01/20/19 0023  HGBA1C 5.9*   CBG: Recent Labs  Lab 01/21/19 1150 01/21/19 1722 01/21/19 2033 01/22/19 0803 01/22/19 1310  GLUCAP 117* 143* 172* 120* 113*   Lipid Profile: No results for input(s): CHOL, HDL, LDLCALC, TRIG, CHOLHDL, LDLDIRECT in the last 72 hours. Thyroid Function Tests: No results for input(s): TSH, T4TOTAL, FREET4, T3FREE, THYROIDAB in the last 72 hours. Anemia Panel: No results for input(s): VITAMINB12, FOLATE, FERRITIN, TIBC, IRON, RETICCTPCT in the last 72 hours. Sepsis Labs: Recent Labs  Lab 01/19/19 1843 01/19/19 2014  LATICACIDVEN 2.1* 1.6    Recent Results (from the past 240 hour(s))  Blood culture (routine x 2)     Status: Abnormal   Collection Time: 01/18/19  6:30 PM   Specimen: BLOOD  Result Value Ref Range Status   Specimen Description BLOOD RIGHT ANTECUBITAL  Final   Special Requests   Final    BOTTLES DRAWN AEROBIC AND ANAEROBIC Blood Culture adequate volume   Culture  Setup Time   Final    GRAM NEGATIVE RODS IN BOTH AEROBIC AND ANAEROBIC BOTTLES CRITICAL VALUE NOTED.  VALUE IS CONSISTENT WITH PREVIOUSLY REPORTED AND  CALLED VALUE.    Culture (A)  Final    CITROBACTER KOSERI SUSCEPTIBILITIES PERFORMED ON PREVIOUS CULTURE WITHIN THE LAST 5 DAYS. Performed at [facility], [place] [address], [place],  [address]    Report Status 01/21/2019 FINAL  Final  SARS Coronavirus 2 (CEPHEID - Performed in [facility] hospital lab), Hosp Order     Status: None   Collection Time: 01/18/19  6:38 PM   Specimen: Nasopharyngeal Swab  Result Value Ref Range Status   SARS Coronavirus 2 NEGATIVE NEGATIVE Final    Comment: (NOTE) If result is NEGATIVE SARS-CoV-2 target nucleic acids are NOT DETECTED. The SARS-CoV-2 RNA is generally detectable in upper and lower  respiratory specimens during the acute phase of infection. The lowest  concentration of SARS-CoV-2 viral copies this assay can detect is 250  copies / mL. A negative result does not preclude  SARS-CoV-2 infection  and should not be used as the sole basis for treatment or other  patient management decisions.  A negative result may occur with  improper specimen collection / handling, submission of specimen other  than nasopharyngeal swab, presence of viral mutation(s) within the  areas targeted by this assay, and inadequate number of viral copies  (<250 copies / mL). A negative result must be combined with clinical  observations, patient history, and epidemiological information. If result is POSITIVE SARS-CoV-2 target nucleic acids are DETECTED. The SARS-CoV-2 RNA is generally detectable in upper and lower  respiratory specimens dur ing the acute phase of infection.  Positive  results are indicative of active infection with SARS-CoV-2.  Clinical  correlation with patient history and other diagnostic information is  necessary to determine patient infection status.  Positive results do  not rule out bacterial infection or co-infection with other viruses. If result is PRESUMPTIVE POSTIVE SARS-CoV-2 nucleic acids MAY BE PRESENT.   A presumptive positive  result was obtained on the submitted specimen  and confirmed on repeat testing.  While 2019 novel coronavirus  (SARS-CoV-2) nucleic acids may be present in the submitted sample  additional confirmatory testing may be necessary for epidemiological  and / or clinical management purposes  to differentiate between  SARS-CoV-2 and other Sarbecovirus currently known to infect humans.  If clinically indicated additional testing with an alternate test  methodology [phone]) is advised. The SARS-CoV-2 RNA is generally  detectable in upper and lower respiratory sp ecimens during the acute  phase of infection. The expected result is Negative. Fact Sheet for Patients:  StrictlyIdeas.no Fact Sheet for Healthcare Providers: BankingDealers.co.za This test is not yet approved or cleared by the [place] FDA and has been authorized for detection and/or diagnosis of SARS-CoV-2 by FDA under an Emergency Use Authorization (EUA).  This EUA will remain in effect (meaning this test can be used) for the duration of the COVID-19 declaration under Section 564(b)(1) of the Act, 21 U.S.C. section 360bbb-3(b)(1), unless the authorization is terminated or revoked sooner. Performed at [facility], [place] [address] [place], [place] [address]   Blood culture (routine x 2)     Status: Abnormal   Collection Time: 01/18/19  7:01 PM   Specimen: BLOOD  Result Value Ref Range Status   Specimen Description BLOOD LEFT ANTECUBITAL  Final   Special Requests   Final    BOTTLES DRAWN AEROBIC AND ANAEROBIC Blood Culture results may not be optimal due to an inadequate volume of blood received in culture bottles   Culture  Setup Time   Final    GRAM NEGATIVE RODS IN BOTH AEROBIC AND ANAEROBIC BOTTLES CRITICAL RESULT CALLED TO, READ BACK BY AND VERIFIED WITH: RN Jerilynn Mages SIMMS [phone] MLM Performed at [facility], [place] [address] [place], [place] [address]    Culture  CITROBACTER KOSERI (A)  Final   Report Status 01/21/2019 FINAL  Final   Organism ID, Bacteria CITROBACTER KOSERI  Final      Susceptibility   Citrobacter koseri - MIC*    CEFAZOLIN <=4 SENSITIVE Sensitive     CEFEPIME <=1 SENSITIVE Sensitive     CEFTAZIDIME <=1 SENSITIVE Sensitive     CEFTRIAXONE <=1 SENSITIVE Sensitive     CIPROFLOXACIN <=0.25 SENSITIVE Sensitive     GENTAMICIN <=1 SENSITIVE Sensitive     IMIPENEM <=0.25 SENSITIVE Sensitive     TRIMETH/SULFA <=20 SENSITIVE Sensitive     PIP/TAZO <=4 SENSITIVE Sensitive     *  CITROBACTER KOSERI  Blood Culture ID Panel (Reflexed)     Status: Abnormal   Collection Time: 01/18/19  7:01 PM  Result Value Ref Range Status   Enterococcus species NOT DETECTED NOT DETECTED Final   Listeria monocytogenes NOT DETECTED NOT DETECTED Final   Staphylococcus species NOT DETECTED NOT DETECTED Final   Staphylococcus aureus (BCID) NOT DETECTED NOT DETECTED Final   Streptococcus species NOT DETECTED NOT DETECTED Final   Streptococcus agalactiae NOT DETECTED NOT DETECTED Final   Streptococcus pneumoniae NOT DETECTED NOT DETECTED Final   Streptococcus pyogenes NOT DETECTED NOT DETECTED Final   Acinetobacter baumannii NOT DETECTED NOT DETECTED Final   Enterobacteriaceae species DETECTED (A) NOT DETECTED Final    Comment: Enterobacteriaceae represent a large family of gram negative bacteria, not a single organism. Refer to culture for further identification. CRITICAL RESULT CALLED TO, READ BACK BY AND VERIFIED WITH: RN M SIMMS [phone] MLM    Enterobacter cloacae complex NOT DETECTED NOT DETECTED Final   Escherichia coli NOT DETECTED NOT DETECTED Final   Klebsiella oxytoca NOT DETECTED NOT DETECTED Final   Klebsiella pneumoniae NOT DETECTED NOT DETECTED Final   Proteus species NOT DETECTED NOT DETECTED Final   Serratia marcescens NOT DETECTED NOT DETECTED Final   Carbapenem resistance NOT DETECTED NOT DETECTED Final   Haemophilus influenzae NOT  DETECTED NOT DETECTED Final   Neisseria meningitidis NOT DETECTED NOT DETECTED Final   Pseudomonas aeruginosa NOT DETECTED NOT DETECTED Final   Candida albicans NOT DETECTED NOT DETECTED Final   Candida glabrata NOT DETECTED NOT DETECTED Final   Candida krusei NOT DETECTED NOT DETECTED Final   Candida parapsilosis NOT DETECTED NOT DETECTED Final   Candida tropicalis NOT DETECTED NOT DETECTED Final    Comment: Performed at [facility], [place] [address] [place], [place] [address]  Blood culture (routine x 2)     Status: None (Preliminary result)   Collection Time: 01/19/19 11:36 PM   Specimen: BLOOD  Result Value Ref Range Status   Specimen Description BLOOD LEFT ANTECUBITAL  Final   Special Requests   Final    BOTTLES DRAWN AEROBIC AND ANAEROBIC Blood Culture adequate volume   Culture   Final    NO GROWTH 2 DAYS Performed at [facility], [place] [address], [place], [place] [address]    Report Status PENDING  Incomplete  Blood culture (routine x 2)     Status: None (Preliminary result)   Collection Time: 01/19/19 11:50 PM   Specimen: BLOOD  Result Value Ref Range Status   Specimen Description BLOOD RIGHT ANTECUBITAL  Final   Special Requests   Final    BOTTLES DRAWN AEROBIC AND ANAEROBIC Blood Culture adequate volume   Culture   Final    NO GROWTH 2 DAYS Performed at [facility], [place] [address] [place], [place] [address]    Report Status PENDING  Incomplete  SARS Coronavirus 2 (CEPHEID - Performed in [facility] hospital lab), Hosp Order     Status: None   Collection Time: 01/20/19 12:33 AM   Specimen: Nasopharyngeal Swab  Result Value Ref Range Status   SARS Coronavirus 2 NEGATIVE NEGATIVE Final    Comment: (NOTE) If result is NEGATIVE SARS-CoV-2 target nucleic acids are NOT DETECTED. The SARS-CoV-2 RNA is generally detectable in upper and lower  respiratory specimens during the acute phase of infection. The lowest  concentration of SARS-CoV-2 viral copies  this assay can detect is 250  copies / mL. A  negative result does not preclude SARS-CoV-2 infection  and should not be used as the sole basis for treatment or other  patient management decisions.  A negative result may occur with  improper specimen collection / handling, submission of specimen other  than nasopharyngeal swab, presence of viral mutation(s) within the  areas targeted by this assay, and inadequate number of viral copies  (<250 copies / mL). A negative result must be combined with clinical  observations, patient history, and epidemiological information. If result is POSITIVE SARS-CoV-2 target nucleic acids are DETECTED. The SARS-CoV-2 RNA is generally detectable in upper and lower  respiratory specimens dur ing the acute phase of infection.  Positive  results are indicative of active infection with SARS-CoV-2.  Clinical  correlation with patient history and other diagnostic information is  necessary to determine patient infection status.  Positive results do  not rule out bacterial infection or co-infection with other viruses. If result is PRESUMPTIVE POSTIVE SARS-CoV-2 nucleic acids MAY BE PRESENT.   A presumptive positive result was obtained on the submitted specimen  and confirmed on repeat testing.  While 2019 novel coronavirus  (SARS-CoV-2) nucleic acids may be present in the submitted sample  additional confirmatory testing may be necessary for epidemiological  and / or clinical management purposes  to differentiate between  SARS-CoV-2 and other Sarbecovirus currently known to infect humans.  If clinically indicated additional testing with an alternate test  methodology [phone]) is advised. The SARS-CoV-2 RNA is generally  detectable in upper and lower respiratory sp ecimens during the acute  phase of infection. The expected result is Negative. Fact Sheet for Patients:  StrictlyIdeas.no Fact Sheet for Healthcare Providers:  BankingDealers.co.za This test is not yet approved or cleared by the [place] FDA and has been authorized for detection and/or diagnosis of SARS-CoV-2 by FDA under an Emergency Use Authorization (EUA).  This EUA will remain in effect (meaning this test can be used) for the duration of the COVID-19 declaration under Section 564(b)(1) of the Act, 21 U.S.C. section 360bbb-3(b)(1), unless the authorization is terminated or revoked sooner. Performed at [facility], [place] [address], [place], [place] [address]   Culture, blood (single)     Status: None (Preliminary result)   Collection Time: 01/20/19  3:29 AM   Specimen: BLOOD  Result Value Ref Range Status   Specimen Description BLOOD RIGHT ANTECUBITAL  Final   Special Requests   Final    BOTTLES DRAWN AEROBIC AND ANAEROBIC Blood Culture results may not be optimal due to an inadequate volume of blood received in culture bottles   Culture   Final    NO GROWTH 2 DAYS Performed at [facility], [place] [address], [place],  [address]    Report Status PENDING  Incomplete      Radiology Studies: Ir Exchange Biliary Drain  Result Date: 01/22/2019 INDICATION: History of acute cholecystitis, post image guided cholecystostomy tube placement 07/19/2018. Patient was recently evaluated interventional radiology drain Clinic on 01/17/2019 with cholecystostomy tube injection demonstrating patency of the cystic and common bile ducts. Patient was scheduled for routine fluoroscopic guided exchange as existing cholecystostomy tube has been placed since the time of the initial placement in January of this year however patient developed bacteremia following the cholangiogram. Patient now presents for fluoroscopic guided exchange. EXAM: ULTRASOUND AND FLUOROSCOPIC-GUIDED CHOLECYSTOSTOMY TUBE PLACEMENT COMPARISON:  Cholangiogram via existing cholecystostomy tube-01/17/2019; CT abdomen pelvis-01/18/2019; image guided  cholecystostomy tube placement-07/19/2018 MEDICATIONS: The patient is currently admitted to the hospital  and on intravenous antibiotics. Antibiotics were administered within an appropriate time frame prior to skin puncture. ANESTHESIA/SEDATION: None CONTRAST:  10mL OMNIPAQUE IOHEXOL 300 MG/ML SOLN - administered into the gallbladder fossa. FLUOROSCOPY TIME:  2 minutes, 48 seconds (81.1 mGy) COMPLICATIONS: None immediate. PROCEDURE: Informed written consent was obtained from the patient after a discussion of the risks, benefits and alternatives to treatment. Questions regarding the procedure were encouraged and answered. A timeout was performed prior to the initiation of the procedure. The right upper abdominal quadrant was prepped and draped in the usual sterile fashion, and a sterile drape was applied covering the operative field. Maximum barrier sterile technique with sterile gowns and gloves were used for the procedure. A timeout was performed prior to the initiation of the procedure. Local anesthesia was provided with 1% lidocaine with epinephrine. Appears spot fluoroscopic image was obtained of the right upper abdominal quadrant existing cholecystostomy tube. Contrast injection demonstrates appropriate positioning of the cholecystostomy tube with end coiled and locked within the gallbladder lumen. There is passage of contrast through the cystic duct and CBD to the level of the duodenum. External portion of the cholecystostomy tube was cut and the cholecystostomy tube was cannulated with a short Amplatz wire. Under intermittent fluoroscopic guidance, the existing cholecystostomy tube was exchanged for a new 10 French percutaneous cholecystostomy tube with end coiled and locked within the gallbladder fossa. Post exchange contrast injection was performed confirming appropriate position functionality of existing cholecystostomy tube The external portion of the cholecystostomy tube was secured at the skin entrance  site within interrupted suture and a Stat Lock device. The cholecystostomy tube was reconnected to a gravity bag. Dressings were applied. The patient tolerated the procedure well without immediate postprocedural complication. IMPRESSION: Successful fluoroscopic guided exchange of existing 10.2 French cholecystostomy tube. PLAN: - Given recent bacteremia, would recommend maintaining the cholecystostomy tube to gravity bag. - The patient will return to the interventional radiology clinic the week of August 3rd for repeat cholangiogram. At that time, if the cystic and common bile ducts remain patent, would begin 1 week trial of capping. - Ultimately if the patient passes his trial of internalization/capping, the cholecystostomy tube may be removed. Note, this plan is in keeping with plan of care arranged with referring surgeon, Dr. Dema Severin, at the time of his IR drain clinic appointment on 01/17/2019. Electronically Signed   By: Sandi Mariscal M.D.   On: 01/22/2019 13:21      Scheduled Meds: . clopidogrel  75 mg Oral Daily  . heparin  5,000 Units Subcutaneous Q8H  . insulin aspart  0-9 Units Subcutaneous TID WC  . lidocaine      . metoprolol tartrate  25 mg Oral BID  . pantoprazole  40 mg Oral BID  . rosuvastatin  20 mg Oral QPM  . sodium chloride flush  3 mL Intravenous Once   Continuous Infusions: . cefTRIAXone (ROCEPHIN)  IV 2 g (01/22/19 1521)     LOS: 3 days    Time spent: Total of 35 minutes spent with pt, greater than 50% of which was spent in discussion of  treatment, counseling and coordination of care   Berle Mull, MD  How to contact the [facility] Attending or Consulting provider [place] or covering provider during after hours [place], for this patient?  1. Check the care team in [facility] and look for a) attending/consulting [facility] provider listed and b) the [facility] team listed 2. Log into [email or web address] and use [place]'s universal password to  access. If you do not have the password, please contact  the hospital operator. 3. Locate the [facility] provider you are looking for under [facility] and page to a number that you can be directly reached. 4. If you still have difficulty reaching the provider, please page the [facility] (Director on Call) for the Hospitalists listed on amion for assistance.

## 2019-01-22 NOTE — Plan of Care (Signed)
  Problem: Health Behavior/Discharge Planning: Goal: Ability to manage health-related needs will improve Outcome: Progressing   

## 2019-01-22 NOTE — Progress Notes (Signed)
Pharmacy Antibiotic Note  Christian Bailey is a 78 y.o. male admitted on 01/19/2019 with after positive blood culture for Citrobacter koseri (pan-S). Pt has recent gallbladder cholesystostomy tube placement. Pharmacy has been consulted for cefepime dosing.   D/W Dr. Posey Pronto. Based on sensitivities, will de-escalate to Ceftriaxone 2gm IV q24h   Plan: -Change Cefepime to Ceftriaxone 2g IV q24h  -Monitor renal function, LOT  Height: 5\' 5"  (165.1 cm) Weight: 137 lb 5.6 oz (62.3 kg) IBW/kg (Calculated) : 61.5  Temp (24hrs), Avg:99 F (37.2 C), Min:98.3 F (36.8 C), Max:99.3 F (37.4 C)  Recent Labs  Lab 01/18/19 1728 01/18/19 1840 01/19/19 1843 01/19/19 2014 01/20/19 0329 01/21/19 0317 01/22/19 0224  WBC 13.9*  --  11.5*  --  7.4 5.4 5.3  CREATININE 1.93* 1.80* 2.19*  --  1.87* 1.59* 1.58*  LATICACIDVEN  --   --  2.1* 1.6  --   --   --     Estimated Creatinine Clearance: 34.1 mL/min (A) (by C-G formula based on SCr of 1.58 mg/dL (H)).    Allergies  Allergen Reactions  . Asa [Aspirin] Other (See Comments)    GI bleeding  . Gadolinium Derivatives Itching, Swelling and Other (See Comments)    Pt complained of face flushing/hottness and throat tightness/scratchiness immediately after the injections.  Within 4 minutes, all symptoms were gone and the study was completed.  No further complications or signs of allergy were exhibited after completion of study.   . Oxycodone-Acetaminophen Other (See Comments)    Dizziness and feeling of being uncomfortable     Antimicrobials this admission: Cefepime 7/23 >>7/26 Ceftriaxone 7/26>>   Microbiology results: 7/22 BCx: Citrobacter koseri  Linde Wilensky A. Levada Dy, PharmD, BCPS, FNKF Clinical Pharmacist [place] Please utilize Amion for appropriate phone number to reach the unit pharmacist ([place])   01/22/2019

## 2019-01-22 NOTE — Procedures (Signed)
Pre procedural Dx: Acute Cholelithiasis, poor operative candidate. Post procedural Dx: Same  Successful fluoroscopic guided exchange of existing 10.2 French cholecystostomy tube.  EBL: None Complications: None immediate  PLAN:  - Given recent bacteremia, would recommend maintaining the cholecystostomy tube to gravity bag.  - The patient will return to the interventional radiology clinic the week of August 3rd for repeat cholangiogram.  At that time, if the cystic and common bile ducts remain patent, would begin 1 week trial of capping. - Ultimately if the patient passes his trial of internalization/capping, the cholecystostomy tube may be removed.   Note, this plan is in keeping with plan of care arranged with referring surgeon, Dr. Dema Severin, at the time of his IR drain clinic appointment on 01/17/2019.  Ronny Bacon, MD Pager #: [phone]

## 2019-01-23 LAB — CBC WITH DIFFERENTIAL/PLATELET
Abs Immature Granulocytes: 0.03 10*3/uL (ref 0.00–0.07)
Basophils Absolute: 0 10*3/uL (ref 0.0–0.1)
Basophils Relative: 1 %
Eosinophils Absolute: 0.2 10*3/uL (ref 0.0–0.5)
Eosinophils Relative: 4 %
HCT: 24.2 % — ABNORMAL LOW (ref 39.0–52.0)
Hemoglobin: 8 g/dL — ABNORMAL LOW (ref 13.0–17.0)
Immature Granulocytes: 1 %
Lymphocytes Relative: 17 %
Lymphs Abs: 0.9 10*3/uL (ref 0.7–4.0)
MCH: 29.9 pg (ref 26.0–34.0)
MCHC: 33.1 g/dL (ref 30.0–36.0)
MCV: 90.3 fL (ref 80.0–100.0)
Monocytes Absolute: 0.7 10*3/uL (ref 0.1–1.0)
Monocytes Relative: 14 %
Neutro Abs: 3.2 10*3/uL (ref 1.7–7.7)
Neutrophils Relative %: 63 %
Platelets: 185 10*3/uL (ref 150–400)
RBC: 2.68 MIL/uL — ABNORMAL LOW (ref 4.22–5.81)
RDW: 13.4 % (ref 11.5–15.5)
WBC: 5.1 10*3/uL (ref 4.0–10.5)
nRBC: 0 % (ref 0.0–0.2)

## 2019-01-23 LAB — COMPREHENSIVE METABOLIC PANEL
ALT: 18 U/L (ref 0–44)
AST: 20 U/L (ref 15–41)
Albumin: 2.8 g/dL — ABNORMAL LOW (ref 3.5–5.0)
Alkaline Phosphatase: 87 U/L (ref 38–126)
Anion gap: 8 (ref 5–15)
BUN: 18 mg/dL (ref 8–23)
CO2: 25 mmol/L (ref 22–32)
Calcium: 8.5 mg/dL — ABNORMAL LOW (ref 8.9–10.3)
Chloride: 106 mmol/L (ref 98–111)
Creatinine, Ser: 1.48 mg/dL — ABNORMAL HIGH (ref 0.61–1.24)
GFR calc Af Amer: 52 mL/min — ABNORMAL LOW (ref >60)
GFR calc non Af Amer: 45 mL/min — ABNORMAL LOW (ref >60)
Glucose, Bld: 163 mg/dL — ABNORMAL HIGH (ref 70–99)
Potassium: 3.9 mmol/L (ref 3.5–5.1)
Sodium: 139 mmol/L (ref 135–145)
Total Bilirubin: 0.3 mg/dL (ref 0.3–1.2)
Total Protein: 5.9 g/dL — ABNORMAL LOW (ref 6.5–8.1)

## 2019-01-23 LAB — GLUCOSE, CAPILLARY
Glucose-Capillary: 103 mg/dL — ABNORMAL HIGH (ref 70–99)
Glucose-Capillary: 131 mg/dL — ABNORMAL HIGH (ref 70–99)
Glucose-Capillary: 148 mg/dL — ABNORMAL HIGH (ref 70–99)
Glucose-Capillary: 139 mg/dL — ABNORMAL HIGH (ref 70–99)

## 2019-01-23 NOTE — Progress Notes (Signed)
[facility] PROGRESS NOTE  Christian Bailey EHU:[id number] DOB: [birth year] DOA: 01/19/2019 PCP: Prince Solian, MD  Brief summary   78 year old male with medical history significant for diabetes type 2, hypertension, PAD status post aorto bifemoral bypass in January 2020.  He developed acute cholecystitis postop in January and is a status post cholecystostomy tube for the past 7 months which was scheduled for capping 7/25.  Patient presented to the ED with fevers, right side abdominal pain, CT scan show appropriate position of the tube without obvious complications.  Blood cultures were drawn which came back positive for GNR 2/2.  Patient was started on cefepime and admitted for IR evaluation and possibly changing gallbladder catheter.  Assessment/Plan:  Sepsis secondary to E. coli bacteremia. Chronic cholecystitis. S/P cholecystostomy tube drain placement. Suspect bacteremia/sepsis from biliary drain.  Initially on cefepime. Initial blood cultures positive for citrobacter. Repeat blood culture: NGTD on iv antibiotics.  Sepsis physiology resolved. Transitioned to ceftriaxone. Will transition to PO regimen in 24-48 hrs. D/w  surgery on call, who recommended to f/u in surgical clinic at discharge after IR studies  -IR consulted for evaluation of drain.  Underwent drain exchange on 01/22/2019. Repeat cholangiogram is scheduled for 01/30/19.  Hypertension. BP stable, continue metoprolol, given slight elevated creatinine we will continue to hold lisinopril.  Diabetes mellitus type 2.  Controlled with renal complication Stable, continue insulin sliding scale..   CKD stage III. Chronic, above baseline.  Continue to monitor renal function.  Hold lisinopril.  Encourage oral hydration.  Avoid nephrotoxic agents.  PAD status post aorto bifemoral bypass in January 2020.  on plavix.   Code Status: full Family Communication: d/w patient, RN (indicate person spoken with, relationship, and if by phone, the  number) Disposition Plan: remains inpatient    Consultants:  IR  Procedures:  IR drain exchange   Antibiotics: Anti-infectives (From admission, onward)   Start     Dose/Rate Route Frequency Ordered Stop   01/22/19 1315  cefTRIAXone (ROCEPHIN) 2 g in sodium chloride 0.9 % 100 mL IVPB     2 g 200 mL/hr over 30 Minutes Intravenous Every 24 hours 01/22/19 1305     01/21/19 1115  ceFEPIme (MAXIPIME) 2 g in sodium chloride 0.9 % 100 mL IVPB  Status:  Discontinued     2 g 200 mL/hr over 30 Minutes Intravenous Every 12 hours 01/21/19 1104 01/22/19 1305   01/20/19 2200  ceFEPIme (MAXIPIME) 2 g in sodium chloride 0.9 % 100 mL IVPB  Status:  Discontinued     2 g 200 mL/hr over 30 Minutes Intravenous Every 24 hours 01/19/19 2350 01/21/19 1104   01/19/19 2315  ceFEPIme (MAXIPIME) 2 g in sodium chloride 0.9 % 100 mL IVPB     2 g 200 mL/hr over 30 Minutes Intravenous  Once 01/19/19 2306 01/20/19 0125        (indicate start date, and stop date if known)  HPI/Subjective: No distress. No acute abdominal pains  Objective: Vitals:   01/22/19 2206 01/23/19 0432  BP: (!) 128/59 120/71  Pulse: (!) 107 69  Resp:  18  Temp:  98.2 F (36.8 C)  SpO2:  97%    Intake/Output Summary (Last 24 hours) at 01/23/2019 1249 Last data filed at 01/23/2019 1220 Gross per 24 hour  Intake 1004 ml  Output 2100 ml  Net -1096 ml   Filed Weights   01/19/19 2333 01/19/19 2355 01/20/19 0053  Weight: 61.2 kg 62.1 kg 62.3 kg    Exam:  General:  No distress   Cardiovascular: s1,s2 rrr  Respiratory: CTA BL  Abdomen: soft, nt   Musculoskeletal: no leg edema    Data Reviewed: Basic Metabolic Panel: Recent Labs  Lab 01/19/19 1843 01/20/19 0329 01/21/19 0317 01/22/19 0224 01/23/19 0249  NA 132* 138 138 139 139  K 4.7 4.9 4.2 4.1 3.9  CL 100 107 107 107 106  CO2 21* 21* 20* 22 25  GLUCOSE 141* 108* 111* 127* 163*  BUN 41* 37* 26* 18 18  CREATININE 2.19* 1.87* 1.59* 1.58* 1.48*  CALCIUM  8.3* 8.3* 8.6* 8.7* 8.5*   Liver Function Tests: Recent Labs  Lab 01/18/19 1728 01/19/19 1843 01/20/19 0329 01/23/19 0249  AST 26 32 25 20  ALT 17 22 21 18   ALKPHOS 90 76 72 87  BILITOT 0.7 0.5 0.5 0.3  PROT 6.8 6.1* 5.7* 5.9*  ALBUMIN 3.7 3.3* 3.0* 2.8*   Recent Labs  Lab 01/18/19 1728  LIPASE 25   No results for input(s): AMMONIA in the last 168 hours. CBC: Recent Labs  Lab 01/19/19 1843 01/20/19 0329 01/21/19 0317 01/22/19 0224 01/23/19 0249  WBC 11.5* 7.4 5.4 5.3 5.1  NEUTROABS 9.9*  --   --   --  3.2  HGB 9.2* 8.4* 8.8* 8.9* 8.0*  HCT 28.2* 26.0* 26.8* 26.8* 24.2*  MCV 92.5 92.5 90.5 90.5 90.3  PLT 175 152 160 179 185   Cardiac Enzymes: No results for input(s): CKTOTAL, CKMB, CKMBINDEX, TROPONINI in the last 168 hours. BNP (last 3 results) No results for input(s): BNP in the last 8760 hours.  ProBNP (last 3 results) No results for input(s): PROBNP in the last 8760 hours.  CBG: Recent Labs  Lab 01/22/19 1310 01/22/19 1718 01/22/19 2124 01/23/19 0752 01/23/19 1219  GLUCAP 113* 185* 106* 103* 131*    Recent Results (from the past 240 hour(s))  Blood culture (routine x 2)     Status: Abnormal   Collection Time: 01/18/19  6:30 PM   Specimen: BLOOD  Result Value Ref Range Status   Specimen Description BLOOD RIGHT ANTECUBITAL  Final   Special Requests   Final    BOTTLES DRAWN AEROBIC AND ANAEROBIC Blood Culture adequate volume   Culture  Setup Time   Final    GRAM NEGATIVE RODS IN BOTH AEROBIC AND ANAEROBIC BOTTLES CRITICAL VALUE NOTED.  VALUE IS CONSISTENT WITH PREVIOUSLY REPORTED AND CALLED VALUE.    Culture (A)  Final    CITROBACTER KOSERI SUSCEPTIBILITIES PERFORMED ON PREVIOUS CULTURE WITHIN THE LAST 5 DAYS. Performed at [facility], [place] [address], [place], [place] [address]    Report Status 01/21/2019 FINAL  Final  SARS Coronavirus 2 (CEPHEID - Performed in [facility] hospital lab), Hosp Order     Status: None   Collection Time:  01/18/19  6:38 PM   Specimen: Nasopharyngeal Swab  Result Value Ref Range Status   SARS Coronavirus 2 NEGATIVE NEGATIVE Final    Comment: (NOTE) If result is NEGATIVE SARS-CoV-2 target nucleic acids are NOT DETECTED. The SARS-CoV-2 RNA is generally detectable in upper and lower  respiratory specimens during the acute phase of infection. The lowest  concentration of SARS-CoV-2 viral copies this assay can detect is 250  copies / mL. A negative result does not preclude SARS-CoV-2 infection  and should not be used as the sole basis for treatment or other  patient management decisions.  A negative result may occur with  improper specimen collection / handling, submission of specimen other  than nasopharyngeal swab, presence of viral mutation(s) within the  areas targeted by this assay, and inadequate number of viral copies  (<250 copies / mL). A negative result must be combined with clinical  observations, patient history, and epidemiological information. If result is POSITIVE SARS-CoV-2 target nucleic acids are DETECTED. The SARS-CoV-2 RNA is generally detectable in upper and lower  respiratory specimens dur ing the acute phase of infection.  Positive  results are indicative of active infection with SARS-CoV-2.  Clinical  correlation with patient history and other diagnostic information is  necessary to determine patient infection status.  Positive results do  not rule out bacterial infection or co-infection with other viruses. If result is PRESUMPTIVE POSTIVE SARS-CoV-2 nucleic acids MAY BE PRESENT.   A presumptive positive result was obtained on the submitted specimen  and confirmed on repeat testing.  While 2019 novel coronavirus  (SARS-CoV-2) nucleic acids may be present in the submitted sample  additional confirmatory testing may be necessary for epidemiological  and / or clinical management purposes  to differentiate between  SARS-CoV-2 and other Sarbecovirus currently known to  infect humans.  If clinically indicated additional testing with an alternate test  methodology [phone]) is advised. The SARS-CoV-2 RNA is generally  detectable in upper and lower respiratory sp ecimens during the acute  phase of infection. The expected result is Negative. Fact Sheet for Patients:  StrictlyIdeas.no Fact Sheet for Healthcare Providers: BankingDealers.co.za This test is not yet approved or cleared by the [place] FDA and has been authorized for detection and/or diagnosis of SARS-CoV-2 by FDA under an Emergency Use Authorization (EUA).  This EUA will remain in effect (meaning this test can be used) for the duration of the COVID-19 declaration under Section 564(b)(1) of the Act, 21 U.S.C. section 360bbb-3(b)(1), unless the authorization is terminated or revoked sooner. Performed at [facility], [place] [address], [place], [place] [address]   Blood culture (routine x 2)     Status: Abnormal   Collection Time: 01/18/19  7:01 PM   Specimen: BLOOD  Result Value Ref Range Status   Specimen Description BLOOD LEFT ANTECUBITAL  Final   Special Requests   Final    BOTTLES DRAWN AEROBIC AND ANAEROBIC Blood Culture results may not be optimal due to an inadequate volume of blood received in culture bottles   Culture  Setup Time   Final    GRAM NEGATIVE RODS IN BOTH AEROBIC AND ANAEROBIC BOTTLES CRITICAL RESULT CALLED TO, READ BACK BY AND VERIFIED WITH: RN Jerilynn Mages SIMMS [phone] Performed at [facility], [place] [address], [place], [place] [address]    Culture CITROBACTER KOSERI (A)  Final   Report Status 01/21/2019 FINAL  Final   Organism ID, Bacteria CITROBACTER KOSERI  Final      Susceptibility   Citrobacter koseri - MIC*    CEFAZOLIN <=4 SENSITIVE Sensitive     CEFEPIME <=1 SENSITIVE Sensitive     CEFTAZIDIME <=1 SENSITIVE Sensitive     CEFTRIAXONE <=1 SENSITIVE Sensitive     CIPROFLOXACIN <=0.25 SENSITIVE  Sensitive     GENTAMICIN <=1 SENSITIVE Sensitive     IMIPENEM <=0.25 SENSITIVE Sensitive     TRIMETH/SULFA <=20 SENSITIVE Sensitive     PIP/TAZO <=4 SENSITIVE Sensitive     * CITROBACTER KOSERI  Blood Culture ID Panel (Reflexed)     Status: Abnormal   Collection Time: 01/18/19  7:01 PM  Result Value Ref Range Status   Enterococcus species NOT DETECTED NOT  DETECTED Final   Listeria monocytogenes NOT DETECTED NOT DETECTED Final   Staphylococcus species NOT DETECTED NOT DETECTED Final   Staphylococcus aureus (BCID) NOT DETECTED NOT DETECTED Final   Streptococcus species NOT DETECTED NOT DETECTED Final   Streptococcus agalactiae NOT DETECTED NOT DETECTED Final   Streptococcus pneumoniae NOT DETECTED NOT DETECTED Final   Streptococcus pyogenes NOT DETECTED NOT DETECTED Final   Acinetobacter baumannii NOT DETECTED NOT DETECTED Final   Enterobacteriaceae species DETECTED (A) NOT DETECTED Final    Comment: Enterobacteriaceae represent a large family of gram negative bacteria, not a single organism. Refer to culture for further identification. CRITICAL RESULT CALLED TO, READ BACK BY AND VERIFIED WITH: RN M SIMMS [id number] MLM    Enterobacter cloacae complex NOT DETECTED NOT DETECTED Final   Escherichia coli NOT DETECTED NOT DETECTED Final   Klebsiella oxytoca NOT DETECTED NOT DETECTED Final   Klebsiella pneumoniae NOT DETECTED NOT DETECTED Final   Proteus species NOT DETECTED NOT DETECTED Final   Serratia marcescens NOT DETECTED NOT DETECTED Final   Carbapenem resistance NOT DETECTED NOT DETECTED Final   Haemophilus influenzae NOT DETECTED NOT DETECTED Final   Neisseria meningitidis NOT DETECTED NOT DETECTED Final   Pseudomonas aeruginosa NOT DETECTED NOT DETECTED Final   Candida albicans NOT DETECTED NOT DETECTED Final   Candida glabrata NOT DETECTED NOT DETECTED Final   Candida krusei NOT DETECTED NOT DETECTED Final   Candida parapsilosis NOT DETECTED NOT DETECTED Final   Candida  tropicalis NOT DETECTED NOT DETECTED Final    Comment: Performed at [facility], [place] [address], [place], [place] [address]  Blood culture (routine x 2)     Status: None (Preliminary result)   Collection Time: 01/19/19 11:36 PM   Specimen: BLOOD  Result Value Ref Range Status   Specimen Description BLOOD LEFT ANTECUBITAL  Final   Special Requests   Final    BOTTLES DRAWN AEROBIC AND ANAEROBIC Blood Culture adequate volume   Culture   Final    NO GROWTH 3 DAYS Performed at [facility], [place] [address] [place], [place] [address]    Report Status PENDING  Incomplete  Blood culture (routine x 2)     Status: None (Preliminary result)   Collection Time: 01/19/19 11:50 PM   Specimen: BLOOD  Result Value Ref Range Status   Specimen Description BLOOD RIGHT ANTECUBITAL  Final   Special Requests   Final    BOTTLES DRAWN AEROBIC AND ANAEROBIC Blood Culture adequate volume   Culture   Final    NO GROWTH 3 DAYS Performed at [facility], [place] [address] [place], [place] [address]    Report Status PENDING  Incomplete  SARS Coronavirus 2 (CEPHEID - Performed in [place] hospital lab), Hosp Order     Status: None   Collection Time: 01/20/19 12:33 AM   Specimen: Nasopharyngeal Swab  Result Value Ref Range Status   SARS Coronavirus 2 NEGATIVE NEGATIVE Final    Comment: (NOTE) If result is NEGATIVE SARS-CoV-2 target nucleic acids are NOT DETECTED. The SARS-CoV-2 RNA is generally detectable in upper and lower  respiratory specimens during the acute phase of infection. The lowest  concentration of SARS-CoV-2 viral copies this assay can detect is 250  copies / mL. A negative result does not preclude SARS-CoV-2 infection  and should not be used as the sole basis for treatment or other  patient management decisions.  A negative result may occur with  improper specimen collection /  handling, submission of specimen other  than nasopharyngeal swab, presence of viral mutation(s)  within the  areas targeted by this assay, and inadequate number of viral copies  (<250 copies / mL). A negative result must be combined with clinical  observations, patient history, and epidemiological information. If result is POSITIVE SARS-CoV-2 target nucleic acids are DETECTED. The SARS-CoV-2 RNA is generally detectable in upper and lower  respiratory specimens dur ing the acute phase of infection.  Positive  results are indicative of active infection with SARS-CoV-2.  Clinical  correlation with patient history and other diagnostic information is  necessary to determine patient infection status.  Positive results do  not rule out bacterial infection or co-infection with other viruses. If result is PRESUMPTIVE POSTIVE SARS-CoV-2 nucleic acids MAY BE PRESENT.   A presumptive positive result was obtained on the submitted specimen  and confirmed on repeat testing.  While 2019 novel coronavirus  (SARS-CoV-2) nucleic acids may be present in the submitted sample  additional confirmatory testing may be necessary for epidemiological  and / or clinical management purposes  to differentiate between  SARS-CoV-2 and other Sarbecovirus currently known to infect humans.  If clinically indicated additional testing with an alternate test  methodology [phone]) is advised. The SARS-CoV-2 RNA is generally  detectable in upper and lower respiratory sp ecimens during the acute  phase of infection. The expected result is Negative. Fact Sheet for Patients:  StrictlyIdeas.no Fact Sheet for Healthcare Providers: BankingDealers.co.za This test is not yet approved or cleared by the [place] FDA and has been authorized for detection and/or diagnosis of SARS-CoV-2 by FDA under an Emergency Use Authorization (EUA).  This EUA will remain in effect (meaning this test can be used) for the duration of the COVID-19 declaration under Section 564(b)(1) of the Act,  21 U.S.C. section 360bbb-3(b)(1), unless the authorization is terminated or revoked sooner. Performed at [facility], [place] [address] [place], [place] [address]   Culture, blood (single)     Status: None (Preliminary result)   Collection Time: 01/20/19  3:29 AM   Specimen: BLOOD  Result Value Ref Range Status   Specimen Description BLOOD RIGHT ANTECUBITAL  Final   Special Requests   Final    BOTTLES DRAWN AEROBIC AND ANAEROBIC Blood Culture results may not be optimal due to an inadequate volume of blood received in culture bottles   Culture   Final    NO GROWTH 3 DAYS Performed at [facility], [place] [address] [place], [place] [address]    Report Status PENDING  Incomplete     Studies: Ir Exchange Biliary Drain  Result Date: 01/22/2019 INDICATION: History of acute cholecystitis, post image guided cholecystostomy tube placement 07/19/2018. Patient was recently evaluated interventional radiology drain Clinic on 01/17/2019 with cholecystostomy tube injection demonstrating patency of the cystic and common bile ducts. Patient was scheduled for routine fluoroscopic guided exchange as existing cholecystostomy tube has been placed since the time of the initial placement in January of this year however patient developed bacteremia following the cholangiogram. Patient now presents for fluoroscopic guided exchange. EXAM: ULTRASOUND AND FLUOROSCOPIC-GUIDED CHOLECYSTOSTOMY TUBE PLACEMENT COMPARISON:  Cholangiogram via existing cholecystostomy tube-01/17/2019; CT abdomen pelvis-01/18/2019; image guided cholecystostomy tube placement-07/19/2018 MEDICATIONS: The patient is currently admitted to the hospital and on intravenous antibiotics. Antibiotics were administered within an appropriate time frame prior to skin puncture. ANESTHESIA/SEDATION: None CONTRAST:  11mL OMNIPAQUE IOHEXOL 300 MG/ML SOLN - administered into the gallbladder fossa. FLUOROSCOPY TIME:  2 minutes, 48 seconds (  03.5 mGy)  COMPLICATIONS: None immediate. PROCEDURE: Informed written consent was obtained from the patient after a discussion of the risks, benefits and alternatives to treatment. Questions regarding the procedure were encouraged and answered. A timeout was performed prior to the initiation of the procedure. The right upper abdominal quadrant was prepped and draped in the usual sterile fashion, and a sterile drape was applied covering the operative field. Maximum barrier sterile technique with sterile gowns and gloves were used for the procedure. A timeout was performed prior to the initiation of the procedure. Local anesthesia was provided with 1% lidocaine with epinephrine. Appears spot fluoroscopic image was obtained of the right upper abdominal quadrant existing cholecystostomy tube. Contrast injection demonstrates appropriate positioning of the cholecystostomy tube with end coiled and locked within the gallbladder lumen. There is passage of contrast through the cystic duct and CBD to the level of the duodenum. External portion of the cholecystostomy tube was cut and the cholecystostomy tube was cannulated with a short Amplatz wire. Under intermittent fluoroscopic guidance, the existing cholecystostomy tube was exchanged for a new 10 French percutaneous cholecystostomy tube with end coiled and locked within the gallbladder fossa. Post exchange contrast injection was performed confirming appropriate position functionality of existing cholecystostomy tube The external portion of the cholecystostomy tube was secured at the skin entrance site within interrupted suture and a Stat Lock device. The cholecystostomy tube was reconnected to a gravity bag. Dressings were applied. The patient tolerated the procedure well without immediate postprocedural complication. IMPRESSION: Successful fluoroscopic guided exchange of existing 10.2 French cholecystostomy tube. PLAN: - Given recent bacteremia, would recommend maintaining the  cholecystostomy tube to gravity bag. - The patient will return to the interventional radiology clinic the week of August 3rd for repeat cholangiogram. At that time, if the cystic and common bile ducts remain patent, would begin 1 week trial of capping. - Ultimately if the patient passes his trial of internalization/capping, the cholecystostomy tube may be removed. Note, this plan is in keeping with plan of care arranged with referring surgeon, Dr. Dema Severin, at the time of his IR drain clinic appointment on 01/17/2019. Electronically Signed   By: Sandi Mariscal M.D.   On: 01/22/2019 13:21    Scheduled Meds: . clopidogrel  75 mg Oral Daily  . heparin  5,000 Units Subcutaneous Q8H  . insulin aspart  0-9 Units Subcutaneous TID WC  . metoprolol tartrate  25 mg Oral BID  . pantoprazole  40 mg Oral BID  . rosuvastatin  20 mg Oral QPM  . sodium chloride flush  3 mL Intravenous Once   Continuous Infusions: . cefTRIAXone (ROCEPHIN)  IV Stopped (01/22/19 1552)    Principal Problem:   Bacteremia due to Gram-negative bacteria Active Problems:   Essential hypertension   Type 2 diabetes mellitus (HCC)   CKD (chronic kidney disease) stage 3, GFR 30-59 ml/min (HCC)    Time spent: >35 minutes     Kinnie Feil  [facility] Pager [phone]. If 7PM-7AM, please contact night-coverage at [email or web address], password [facility] 01/23/2019, 12:49 PM  LOS: 4 days

## 2019-01-23 NOTE — Care Management Important Message (Signed)
Important Message  Patient Details  Name: Christian Bailey MRN: [id number] Date of Birth: 07-Jun-1941   Medicare Important Message Given:  Yes     Memory Argue 01/23/2019, 3:27 PM    SIGNED

## 2019-01-24 ENCOUNTER — Ambulatory Visit (HOSPITAL_COMMUNITY): Payer: Medicare Other

## 2019-01-24 LAB — GLUCOSE, CAPILLARY
Glucose-Capillary: 151 mg/dL — ABNORMAL HIGH (ref 70–99)
Glucose-Capillary: 108 mg/dL — ABNORMAL HIGH (ref 70–99)

## 2019-01-24 MED ORDER — CEFDINIR 300 MG PO CAPS: 300.0000 mg | ORAL_CAPSULE | Freq: Two times a day (BID) | ORAL | 0 refills | Status: AC

## 2019-01-24 NOTE — Progress Notes (Signed)
Discharged home today,patient's wife driving him home. Personal belongings,discharged instructions given to patient. Advised to pick up medicine called in to pharmacy of choice. Verbalized understanding of instructions

## 2019-01-24 NOTE — Discharge Summary (Signed)
Physician Discharge Summary  Degan Hanser MVH:[id number] DOB: 24-Dec-1940 DOA: 01/19/2019  PCP: Prince Solian, MD  Admit date: 01/19/2019 Discharge date: 01/24/2019  Time spent: >35 minutes  Recommendations for Outpatient Follow-up:  F/u with interventional radiology clinic the week of August 3rd for repeat cholangiogram F/u with surgery clinic in 1-2 weeks F/u with PCP in 3-7 days   Discharge Diagnoses:  Principal Problem:   Bacteremia due to Gram-negative bacteria Active Problems:   Essential hypertension   Type 2 diabetes mellitus (HCC)   CKD (chronic kidney disease) stage 3, GFR 30-59 ml/min ([place])   Discharge Condition: stable   Diet recommendation: low sodium,. Carb modified   Filed Weights   01/19/19 2333 01/19/19 2355 01/20/19 0053  Weight: 61.2 kg 62.1 kg 62.3 kg    History of present illness:   78 year old male with medical history significant for diabetes type 2, hypertension, PAD status post aorto bifemoral bypass in January 2020. He developed acute cholecystitis postop in January and is a status post cholecystostomy tube for the past 7 months which was scheduled for capping 7/25. Patient presented to the ED with fevers, right side abdominal pain, CT scan show appropriate position of the tube without obvious complications. Blood cultures were drawn which came back positive for GNR 2/2. Patient was started on cefepime and admitted for IR evaluation and possibly changing gallbladder catheter.  Hospital Course:   Sepsis secondary to E. coli bacteremia. Resolved. Chronic cholecystitis. S/P chronic cholecystostomy tube drain placement 6 month ago. Suspect bacteremia/sepsis from biliary drain.  Initiallyon cefepime. Initial blood cultures positive for citrobacter. Repeat blood culture: NGTD on iv antibiotics.  Sepsis physiology resolved. Transitioned to ceftriaxone then oral cefdinir to complete the testament course. .D/w  surgery on call on 7/27, who recommended to f/u  in surgical clinic at discharge after IR studies  -IR: Underwent drain exchange on 01/22/2019. Repeat cholangiogram is scheduled for 01/30/19. D/w patient, encouraged to f/u with surgery and IR  Hypertension. BP stable, continue metoprolol, given slight elevated creatinine we will continue to hold lisinopril. F/u with PCP in 3-7 days   Diabetes mellitus type 2.ha1c 5.9. hold metformin due to kidney, liver issues. Diet control   CKD stage III. Chronic, above baseline. Continue to monitor renal function. Hold lisinopril. Encourage oral hydration. Avoid nephrotoxic agents.  PAD status post aorto bifemoral bypass in January 2020. on plavix.   Procedures:  IR tube exchange  (i.e. Studies not automatically included, echos, thoracentesis, etc; not x-rays)  Consultations:  IR  Phone consult with surgery   Discharge Exam: Vitals:   01/23/19 2129 01/24/19 0336  BP: 128/68 138/70  Pulse: 91 93  Resp: 17 18  Temp: 98.1 F (36.7 C) 97.6 F (36.4 C)  SpO2: 100% 98%    General: no distress  Cardiovascular: s1,s2 rrr Respiratory: CTA BL  Discharge Instructions  Discharge Instructions    Diet - low sodium heart healthy   Complete by: As directed    Discharge instructions   Complete by: As directed    Please follow up with interventional radiology clinic the week of August 3rd for repeat cholangiogram Please follow up with surgery clinic in 1-2 weeks   Increase activity slowly   Complete by: As directed      Allergies as of 01/24/2019      Reactions   Asa [aspirin] Other (See Comments)   GI bleeding   Gadolinium Derivatives Itching, Swelling, Other (See Comments)   Pt complained of face flushing/hottness and throat tightness/scratchiness immediately after  the injections.  Within 4 minutes, all symptoms were gone and the study was completed.  No further complications or signs of allergy were exhibited after completion of study.    Oxycodone-acetaminophen Other (See  Comments)   Dizziness and feeling of being uncomfortable       Medication List    STOP taking these medications   lisinopril 5 MG tablet Commonly known as: ZESTRIL   metFORMIN 1000 MG tablet Commonly known as: GLUCOPHAGE     TAKE these medications   cefdinir 300 MG capsule Commonly known as: OMNICEF Take 1 capsule (300 mg total) by mouth 2 (two) times daily for 10 days.   clopidogrel 75 MG tablet Commonly known as: PLAVIX TAKE 1 TABLET DAILY   metoprolol tartrate 25 MG tablet Commonly known as: LOPRESSOR Take 1 tablet (25 mg total) by mouth 2 (two) times daily.   pantoprazole 40 MG tablet Commonly known as: PROTONIX TAKE 1 TABLET TWICE A DAY   rosuvastatin 20 MG tablet Commonly known as: CRESTOR Take 20 mg by mouth every evening.      Allergies  Allergen Reactions  . Asa [Aspirin] Other (See Comments)    GI bleeding  . Gadolinium Derivatives Itching, Swelling and Other (See Comments)    Pt complained of face flushing/hottness and throat tightness/scratchiness immediately after the injections.  Within 4 minutes, all symptoms were gone and the study was completed.  No further complications or signs of allergy were exhibited after completion of study.   . Oxycodone-Acetaminophen Other (See Comments)    Dizziness and feeling of being uncomfortable       The results of significant diagnostics from this hospitalization (including imaging, microbiology, ancillary and laboratory) are listed below for reference.    Significant Diagnostic Studies: Ct Abdomen Pelvis W Contrast  Result Date: 01/18/2019 CLINICAL DATA:  Abdominal pain and fevers, known cholecystostomy tube EXAM: CT ABDOMEN AND PELVIS WITH CONTRAST TECHNIQUE: Multidetector CT imaging of the abdomen and pelvis was performed using the standard protocol following bolus administration of intravenous contrast. CONTRAST:  147mL OMNIPAQUE IOHEXOL 300 MG/ML  SOLN COMPARISON:  08/03/2018 FINDINGS: Lower chest: No acute  abnormality. Hepatobiliary: Scattered cysts are noted within the liver stable from the prior exam. The gallbladder is decompressed by cholecystostomy tube which is redundant within the gallbladder. No significant dilatation of the gallbladder is seen to suggest tube obstruction. No leakage of fluid is identified. No biliary ductal dilatation is seen. Pancreas: Unremarkable. No pancreatic ductal dilatation or surrounding inflammatory changes. Spleen: Normal in size without focal abnormality. Adrenals/Urinary Tract: Adrenal glands are within normal limits. Kidneys are well visualized bilaterally. Some scarring in the lower pole of the left kidney is again noted and stable. Renal cysts are seen bilaterally. A nonobstructing right renal stone is noted. No ureteral calculi are seen. The bladder is partially distended. Stomach/Bowel: No obstructive or inflammatory changes of the colon are seen. The appendix is air-filled and within normal limits. No small bowel or gastric abnormality is noted. Vascular/Lymphatic: There are changes consistent with prior aortic bypass. The bypass graft is patent bilaterally previously seen perigraft seromas have resolved in the interval. No significant lymphadenopathy is noted. Reproductive: Prostate is unremarkable. Other: No abdominal wall hernia or abnormality. No abdominopelvic ascites. Musculoskeletal: Degenerative changes of lumbar spine are noted stable from the prior exam. IMPRESSION: Cholecystostomy tube in satisfactory position. No pericholecystic fluid is noted. No findings to suggest tube obstruction are seen. Chronic changes similar to that seen on the prior exam. Resolution of perigraft  seromas surrounding the known aortobifemoral bypass graft. Electronically Signed   By: Inez Catalina M.D.   On: 01/18/2019 20:30   Dg Chest Port 1 View  Result Date: 01/18/2019 CLINICAL DATA:  78 year old male with fever. EXAM: PORTABLE CHEST 1 VIEW COMPARISON:  Chest radiograph dated  08/03/2018 FINDINGS: There is no focal consolidation, pleural effusion, or pneumothorax. The cardiac silhouette is within normal limits. No acute osseous pathology. Right shoulder arthroplasty. Right upper quadrant percutaneous pigtail cholecystostomy. IMPRESSION: No active disease. Electronically Signed   By: Anner Crete M.D.   On: 01/18/2019 20:17   Dg Cholangiogram  Existing Tube  Result Date: 01/17/2019 INDICATION: History of acute cholecystitis, post ultrasound fluoroscopic guided cholecystostomy tube placement on 07/19/2018. Patient subsequent underwent percutaneous drainage catheter injection on 08/04/2018 and again on 08/31/2018 however the original cholecystostomy tube has not been exchanged since the time of initial placement. Patient presents today for percutaneous cholangiogram via existing cholecystostomy tube. EXAM: FLUOROSCOPIC GUIDED CHOLECYSTOSTOMY TUBE INJECTION COMPARISON:  Image guided cholecystostomy tube placement 07/19/2018 cholangiogram via existing cholecystostomy tube-08/31/2018; 08/04/2018; CT abdomen pelvis-08/03/2018 MEDICATIONS: None ANESTHESIA/SEDATION: None CONTRAST:  20 cc Omnipaque 300-administered into the gallbladder fossa. FLUOROSCOPY TIME:  1 minutes, 18 seconds (52 mGy) COMPLICATIONS: None immediate. PROCEDURE: The patient was positioned supine on the fluoroscopy table. A preprocedural spot fluoroscopic image was obtained of the right upper abdominal quadrant existing cholecystostomy tube. Multiple spot fluoroscopic radiographic images were obtained of the right upper abdominal quadrant an existing cholecystostomy tube following injection of a small amount of contrast. Images were reviewed and discussed with the patient. The cholecystostomy tube was flushed with a small amount of saline and capped. A dressing was placed. The patient tolerated the procedure well without immediate postprocedural complication. FINDINGS: Preprocedural spot fluoroscopic image of the right  upper abdominal quadrant demonstrates grossly unchanged positioning of the cholecystostomy tube with end coiled and locked overlying the expected location of the gallbladder fundus. Subsequent contrast injection demonstrates appropriate functionality of the cholecystostomy tube with brisk opacification of the gallbladder. There is passage of contrast from the gallbladder through the cystic and common bile ducts the level duodenum. No definitive intraluminal filling defect to suggest the presence of cholelithiasis or choledocholithiasis. IMPRESSION: 1. Appropriately positioned and functioning cholecystostomy tube. 2. No evidence of cholelithiasis or choledocholithiasis. PLAN: - The patient will be scheduled for routine exchange of the cholecystostomy tube later this week as the tube has not been exchanged since the time of initial placement on 07/19/2018. - if the exchange is uneventful, the cholecystostomy tube will be capped for a trial of internalization. - Assuming the patient passes his trial of internalization/capping, he will return to the interventional radiology drain Clinic 1 week following the exchange/capping for repeat cholangiogram and potential cholecystostomy tube removal. Above discussed and agreed upon with referring surgeon, Dr. Dema Severin. Electronically Signed   By: Sandi Mariscal M.D.   On: 01/17/2019 15:42   Ir Exchange Biliary Drain  Result Date: 01/22/2019 INDICATION: History of acute cholecystitis, post image guided cholecystostomy tube placement 07/19/2018. Patient was recently evaluated interventional radiology drain Clinic on 01/17/2019 with cholecystostomy tube injection demonstrating patency of the cystic and common bile ducts. Patient was scheduled for routine fluoroscopic guided exchange as existing cholecystostomy tube has been placed since the time of the initial placement in January of this year however patient developed bacteremia following the cholangiogram. Patient now presents for  fluoroscopic guided exchange. EXAM: ULTRASOUND AND FLUOROSCOPIC-GUIDED CHOLECYSTOSTOMY TUBE PLACEMENT COMPARISON:  Cholangiogram via existing cholecystostomy tube-01/17/2019; CT abdomen pelvis-01/18/2019;  image guided cholecystostomy tube placement-07/19/2018 MEDICATIONS: The patient is currently admitted to the hospital and on intravenous antibiotics. Antibiotics were administered within an appropriate time frame prior to skin puncture. ANESTHESIA/SEDATION: None CONTRAST:  50mL OMNIPAQUE IOHEXOL 300 MG/ML SOLN - administered into the gallbladder fossa. FLUOROSCOPY TIME:  2 minutes, 48 seconds (40.9 mGy) COMPLICATIONS: None immediate. PROCEDURE: Informed written consent was obtained from the patient after a discussion of the risks, benefits and alternatives to treatment. Questions regarding the procedure were encouraged and answered. A timeout was performed prior to the initiation of the procedure. The right upper abdominal quadrant was prepped and draped in the usual sterile fashion, and a sterile drape was applied covering the operative field. Maximum barrier sterile technique with sterile gowns and gloves were used for the procedure. A timeout was performed prior to the initiation of the procedure. Local anesthesia was provided with 1% lidocaine with epinephrine. Appears spot fluoroscopic image was obtained of the right upper abdominal quadrant existing cholecystostomy tube. Contrast injection demonstrates appropriate positioning of the cholecystostomy tube with end coiled and locked within the gallbladder lumen. There is passage of contrast through the cystic duct and CBD to the level of the duodenum. External portion of the cholecystostomy tube was cut and the cholecystostomy tube was cannulated with a short Amplatz wire. Under intermittent fluoroscopic guidance, the existing cholecystostomy tube was exchanged for a new 10 French percutaneous cholecystostomy tube with end coiled and locked within the  gallbladder fossa. Post exchange contrast injection was performed confirming appropriate position functionality of existing cholecystostomy tube The external portion of the cholecystostomy tube was secured at the skin entrance site within interrupted suture and a Stat Lock device. The cholecystostomy tube was reconnected to a gravity bag. Dressings were applied. The patient tolerated the procedure well without immediate postprocedural complication. IMPRESSION: Successful fluoroscopic guided exchange of existing 10.2 French cholecystostomy tube. PLAN: - Given recent bacteremia, would recommend maintaining the cholecystostomy tube to gravity bag. - The patient will return to the interventional radiology clinic the week of August 3rd for repeat cholangiogram. At that time, if the cystic and common bile ducts remain patent, would begin 1 week trial of capping. - Ultimately if the patient passes his trial of internalization/capping, the cholecystostomy tube may be removed. Note, this plan is in keeping with plan of care arranged with referring surgeon, Dr. Dema Severin, at the time of his IR drain clinic appointment on 01/17/2019. Electronically Signed   By: Sandi Mariscal M.D.   On: 01/22/2019 13:21   Ir Radiologist Eval & Mgmt  Result Date: 01/17/2019 Please refer to notes tab for details about interventional procedure. (Op Note)   Microbiology: Recent Results (from the past 240 hour(s))  Blood culture (routine x 2)     Status: Abnormal   Collection Time: 01/18/19  6:30 PM   Specimen: BLOOD  Result Value Ref Range Status   Specimen Description BLOOD RIGHT ANTECUBITAL  Final   Special Requests   Final    BOTTLES DRAWN AEROBIC AND ANAEROBIC Blood Culture adequate volume   Culture  Setup Time   Final    GRAM NEGATIVE RODS IN BOTH AEROBIC AND ANAEROBIC BOTTLES CRITICAL VALUE NOTED.  VALUE IS CONSISTENT WITH PREVIOUSLY REPORTED AND CALLED VALUE.    Culture (A)  Final    CITROBACTER KOSERI SUSCEPTIBILITIES  PERFORMED ON PREVIOUS CULTURE WITHIN THE LAST 5 DAYS. Performed at [facility], [place] [address], [place], [place] [address]    Report Status 01/21/2019 FINAL  Final  SARS Coronavirus 2 (CEPHEID - Performed in [place] hospital lab), Hosp Order     Status: None   Collection Time: 01/18/19  6:38 PM   Specimen: Nasopharyngeal Swab  Result Value Ref Range Status   SARS Coronavirus 2 NEGATIVE NEGATIVE Final    Comment: (NOTE) If result is NEGATIVE SARS-CoV-2 target nucleic acids are NOT DETECTED. The SARS-CoV-2 RNA is generally detectable in upper and lower  respiratory specimens during the acute phase of infection. The lowest  concentration of SARS-CoV-2 viral copies this assay can detect is 250  copies / mL. A negative result does not preclude SARS-CoV-2 infection  and should not be used as the sole basis for treatment or other  patient management decisions.  A negative result may occur with  improper specimen collection / handling, submission of specimen other  than nasopharyngeal swab, presence of viral mutation(s) within the  areas targeted by this assay, and inadequate number of viral copies  (<250 copies / mL). A negative result must be combined with clinical  observations, patient history, and epidemiological information. If result is POSITIVE SARS-CoV-2 target nucleic acids are DETECTED. The SARS-CoV-2 RNA is generally detectable in upper and lower  respiratory specimens dur ing the acute phase of infection.  Positive  results are indicative of active infection with SARS-CoV-2.  Clinical  correlation with patient history and other diagnostic information is  necessary to determine patient infection status.  Positive results do  not rule out bacterial infection or co-infection with other viruses. If result is PRESUMPTIVE POSTIVE SARS-CoV-2 nucleic acids MAY BE PRESENT.   A presumptive positive result was obtained on the submitted specimen  and confirmed on repeat  testing.  While 2019 novel coronavirus  (SARS-CoV-2) nucleic acids may be present in the submitted sample  additional confirmatory testing may be necessary for epidemiological  and / or clinical management purposes  to differentiate between  SARS-CoV-2 and other Sarbecovirus currently known to infect humans.  If clinically indicated additional testing with an alternate test  methodology [phone]) is advised. The SARS-CoV-2 RNA is generally  detectable in upper and lower respiratory sp ecimens during the acute  phase of infection. The expected result is Negative. Fact Sheet for Patients:  StrictlyIdeas.no Fact Sheet for Healthcare Providers: BankingDealers.co.za This test is not yet approved or cleared by the [place] FDA and has been authorized for detection and/or diagnosis of SARS-CoV-2 by FDA under an Emergency Use Authorization (EUA).  This EUA will remain in effect (meaning this test can be used) for the duration of the COVID-19 declaration under Section 564(b)(1) of the Act, 21 U.S.C. section 360bbb-3(b)(1), unless the authorization is terminated or revoked sooner. Performed at [facility], [place] [address] [place], [place] [address]   Blood culture (routine x 2)     Status: Abnormal   Collection Time: 01/18/19  7:01 PM   Specimen: BLOOD  Result Value Ref Range Status   Specimen Description BLOOD LEFT ANTECUBITAL  Final   Special Requests   Final    BOTTLES DRAWN AEROBIC AND ANAEROBIC Blood Culture results may not be optimal due to an inadequate volume of blood received in culture bottles   Culture  Setup Time   Final    GRAM NEGATIVE RODS IN BOTH AEROBIC AND ANAEROBIC BOTTLES CRITICAL RESULT CALLED TO, READ BACK BY AND VERIFIED WITH: RN Jerilynn Mages SIMMS [id number] MLM Performed at [facility], [place] [address] [place], [place] [address]    Culture CITROBACTER KOSERI (A)  Final   Report Status 01/21/2019 FINAL  Final    Organism ID, Bacteria CITROBACTER KOSERI  Final      Susceptibility   Citrobacter koseri - MIC*    CEFAZOLIN <=4 SENSITIVE Sensitive     CEFEPIME <=1 SENSITIVE Sensitive     CEFTAZIDIME <=1 SENSITIVE Sensitive     CEFTRIAXONE <=1 SENSITIVE Sensitive     CIPROFLOXACIN <=0.25 SENSITIVE Sensitive     GENTAMICIN <=1 SENSITIVE Sensitive     IMIPENEM <=0.25 SENSITIVE Sensitive     TRIMETH/SULFA <=20 SENSITIVE Sensitive     PIP/TAZO <=4 SENSITIVE Sensitive     * CITROBACTER KOSERI  Blood Culture ID Panel (Reflexed)     Status: Abnormal   Collection Time: 01/18/19  7:01 PM  Result Value Ref Range Status   Enterococcus species NOT DETECTED NOT DETECTED Final   Listeria monocytogenes NOT DETECTED NOT DETECTED Final   Staphylococcus species NOT DETECTED NOT DETECTED Final   Staphylococcus aureus (BCID) NOT DETECTED NOT DETECTED Final   Streptococcus species NOT DETECTED NOT DETECTED Final   Streptococcus agalactiae NOT DETECTED NOT DETECTED Final   Streptococcus pneumoniae NOT DETECTED NOT DETECTED Final   Streptococcus pyogenes NOT DETECTED NOT DETECTED Final   Acinetobacter baumannii NOT DETECTED NOT DETECTED Final   Enterobacteriaceae species DETECTED (A) NOT DETECTED Final    Comment: Enterobacteriaceae represent a large family of gram negative bacteria, not a single organism. Refer to culture for further identification. CRITICAL RESULT CALLED TO, READ BACK BY AND VERIFIED WITH: RN M SIMMS [id number] MLM    Enterobacter cloacae complex NOT DETECTED NOT DETECTED Final   Escherichia coli NOT DETECTED NOT DETECTED Final   Klebsiella oxytoca NOT DETECTED NOT DETECTED Final   Klebsiella pneumoniae NOT DETECTED NOT DETECTED Final   Proteus species NOT DETECTED NOT DETECTED Final   Serratia marcescens NOT DETECTED NOT DETECTED Final   Carbapenem resistance NOT DETECTED NOT DETECTED Final   Haemophilus influenzae NOT DETECTED NOT DETECTED Final   Neisseria meningitidis NOT DETECTED NOT  DETECTED Final   Pseudomonas aeruginosa NOT DETECTED NOT DETECTED Final   Candida albicans NOT DETECTED NOT DETECTED Final   Candida glabrata NOT DETECTED NOT DETECTED Final   Candida krusei NOT DETECTED NOT DETECTED Final   Candida parapsilosis NOT DETECTED NOT DETECTED Final   Candida tropicalis NOT DETECTED NOT DETECTED Final    Comment: Performed at [facility], [place] [address], [place], [place] [address]  Blood culture (routine x 2)     Status: None (Preliminary result)   Collection Time: 01/19/19 11:36 PM   Specimen: BLOOD  Result Value Ref Range Status   Specimen Description BLOOD LEFT ANTECUBITAL  Final   Special Requests   Final    BOTTLES DRAWN AEROBIC AND ANAEROBIC Blood Culture adequate volume   Culture   Final    NO GROWTH 4 DAYS Performed at [facility], [place] [address] [place], [place] [address]    Report Status PENDING  Incomplete  Blood culture (routine x 2)     Status: None (Preliminary result)   Collection Time: 01/19/19 11:50 PM   Specimen: BLOOD  Result Value Ref Range Status   Specimen Description BLOOD RIGHT ANTECUBITAL  Final   Special Requests   Final    BOTTLES DRAWN AEROBIC AND ANAEROBIC Blood Culture adequate volume   Culture   Final    NO GROWTH 4 DAYS Performed at [facility], [place] [address], [place], [place] [address]  Report Status PENDING  Incomplete  SARS Coronavirus 2 (CEPHEID - Performed in [place] hospital lab), Hosp Order     Status: None   Collection Time: 01/20/19 12:33 AM   Specimen: Nasopharyngeal Swab  Result Value Ref Range Status   SARS Coronavirus 2 NEGATIVE NEGATIVE Final    Comment: (NOTE) If result is NEGATIVE SARS-CoV-2 target nucleic acids are NOT DETECTED. The SARS-CoV-2 RNA is generally detectable in upper and lower  respiratory specimens during the acute phase of infection. The lowest  concentration of SARS-CoV-2 viral copies this assay can detect is 250  copies / mL. A negative result does not  preclude SARS-CoV-2 infection  and should not be used as the sole basis for treatment or other  patient management decisions.  A negative result may occur with  improper specimen collection / handling, submission of specimen other  than nasopharyngeal swab, presence of viral mutation(s) within the  areas targeted by this assay, and inadequate number of viral copies  (<250 copies / mL). A negative result must be combined with clinical  observations, patient history, and epidemiological information. If result is POSITIVE SARS-CoV-2 target nucleic acids are DETECTED. The SARS-CoV-2 RNA is generally detectable in upper and lower  respiratory specimens dur ing the acute phase of infection.  Positive  results are indicative of active infection with SARS-CoV-2.  Clinical  correlation with patient history and other diagnostic information is  necessary to determine patient infection status.  Positive results do  not rule out bacterial infection or co-infection with other viruses. If result is PRESUMPTIVE POSTIVE SARS-CoV-2 nucleic acids MAY BE PRESENT.   A presumptive positive result was obtained on the submitted specimen  and confirmed on repeat testing.  While 2019 novel coronavirus  (SARS-CoV-2) nucleic acids may be present in the submitted sample  additional confirmatory testing may be necessary for epidemiological  and / or clinical management purposes  to differentiate between  SARS-CoV-2 and other Sarbecovirus currently known to infect humans.  If clinically indicated additional testing with an alternate test  methodology [phone]) is advised. The SARS-CoV-2 RNA is generally  detectable in upper and lower respiratory sp ecimens during the acute  phase of infection. The expected result is Negative. Fact Sheet for Patients:  StrictlyIdeas.no Fact Sheet for Healthcare Providers: BankingDealers.co.za This test is not yet approved or cleared by  the [place] FDA and has been authorized for detection and/or diagnosis of SARS-CoV-2 by FDA under an Emergency Use Authorization (EUA).  This EUA will remain in effect (meaning this test can be used) for the duration of the COVID-19 declaration under Section 564(b)(1) of the Act, 21 U.S.C. section 360bbb-3(b)(1), unless the authorization is terminated or revoked sooner. Performed at [facility], [place] [address], [place], [place] [address]   Culture, blood (single)     Status: None (Preliminary result)   Collection Time: 01/20/19  3:29 AM   Specimen: BLOOD  Result Value Ref Range Status   Specimen Description BLOOD RIGHT ANTECUBITAL  Final   Special Requests   Final    BOTTLES DRAWN AEROBIC AND ANAEROBIC Blood Culture results may not be optimal due to an inadequate volume of blood received in culture bottles   Culture   Final    NO GROWTH 4 DAYS Performed at [facility], [place] [address] [place], [place] [address]    Report Status PENDING  Incomplete     Labs: Basic Metabolic Panel: Recent Labs  Lab 01/19/19 1843 01/20/19 0329 01/21/19  2703 01/22/19 0224 01/23/19 0249  NA 132* 138 138 139 139  K 4.7 4.9 4.2 4.1 3.9  CL 100 107 107 107 106  CO2 21* 21* 20* 22 25  GLUCOSE 141* 108* 111* 127* 163*  BUN 41* 37* 26* 18 18  CREATININE 2.19* 1.87* 1.59* 1.58* 1.48*  CALCIUM 8.3* 8.3* 8.6* 8.7* 8.5*   Liver Function Tests: Recent Labs  Lab 01/18/19 1728 01/19/19 1843 01/20/19 0329 01/23/19 0249  AST 26 32 25 20  ALT 17 22 21 18   ALKPHOS 90 76 72 87  BILITOT 0.7 0.5 0.5 0.3  PROT 6.8 6.1* 5.7* 5.9*  ALBUMIN 3.7 3.3* 3.0* 2.8*   Recent Labs  Lab 01/18/19 1728  LIPASE 25   No results for input(s): AMMONIA in the last 168 hours. CBC: Recent Labs  Lab 01/19/19 1843 01/20/19 0329 01/21/19 0317 01/22/19 0224 01/23/19 0249  WBC 11.5* 7.4 5.4 5.3 5.1  NEUTROABS 9.9*  --   --   --  3.2  HGB 9.2* 8.4* 8.8* 8.9* 8.0*  HCT 28.2* 26.0* 26.8* 26.8*  24.2*  MCV 92.5 92.5 90.5 90.5 90.3  PLT 175 152 160 179 185   Cardiac Enzymes: No results for input(s): CKTOTAL, CKMB, CKMBINDEX, TROPONINI in the last 168 hours. BNP: BNP (last 3 results) No results for input(s): BNP in the last 8760 hours.  ProBNP (last 3 results) No results for input(s): PROBNP in the last 8760 hours.  CBG: Recent Labs  Lab 01/23/19 1219 01/23/19 1700 01/23/19 2128 01/24/19 0849 01/24/19 1205  GLUCAP 131* 148* 139* 151* 108*       Signed:  Adyn Hoes N  [facility] 01/24/2019, 1:10 PM

## 2019-01-25 LAB — CULTURE, BLOOD (ROUTINE X 2)
Culture: NO GROWTH
Culture: NO GROWTH
Special Requests: ADEQUATE
Special Requests: ADEQUATE

## 2019-01-25 LAB — CULTURE, BLOOD (SINGLE): Culture: NO GROWTH

## 2019-01-26 ENCOUNTER — Other Ambulatory Visit: Payer: Medicare Other

## 2019-01-30 DIAGNOSIS — A4150 Gram-negative sepsis, unspecified: Secondary | ICD-10-CM | POA: Diagnosis not present

## 2019-01-30 DIAGNOSIS — I129 Hypertensive chronic kidney disease with stage 1 through stage 4 chronic kidney disease, or unspecified chronic kidney disease: Secondary | ICD-10-CM | POA: Diagnosis not present

## 2019-01-30 DIAGNOSIS — E1151 Type 2 diabetes mellitus with diabetic peripheral angiopathy without gangrene: Secondary | ICD-10-CM | POA: Diagnosis not present

## 2019-01-30 DIAGNOSIS — D649 Anemia, unspecified: Secondary | ICD-10-CM | POA: Diagnosis not present

## 2019-01-30 DIAGNOSIS — N183 Chronic kidney disease, stage 3 (moderate): Secondary | ICD-10-CM | POA: Diagnosis not present

## 2019-01-30 DIAGNOSIS — I739 Peripheral vascular disease, unspecified: Secondary | ICD-10-CM | POA: Diagnosis not present

## 2019-01-31 ENCOUNTER — Encounter: Payer: Self-pay | Admitting: Cardiovascular Disease

## 2019-01-31 ENCOUNTER — Ambulatory Visit (INDEPENDENT_AMBULATORY_CARE_PROVIDER_SITE_OTHER): Payer: Medicare Other | Admitting: Cardiovascular Disease

## 2019-01-31 ENCOUNTER — Other Ambulatory Visit: Payer: Self-pay

## 2019-01-31 VITALS — BP 108/56 | HR 93 | Temp 97.3°F | Ht 65.0 in | Wt 136.0 lb

## 2019-01-31 DIAGNOSIS — I10 Essential (primary) hypertension: Secondary | ICD-10-CM

## 2019-01-31 DIAGNOSIS — I739 Peripheral vascular disease, unspecified: Secondary | ICD-10-CM | POA: Diagnosis not present

## 2019-01-31 DIAGNOSIS — I6529 Occlusion and stenosis of unspecified carotid artery: Secondary | ICD-10-CM | POA: Diagnosis not present

## 2019-01-31 NOTE — Assessment & Plan Note (Signed)
History of essential hypertension the blood pressure measured today at 108/56.  He is on lisinopril, metoprolol

## 2019-01-31 NOTE — Assessment & Plan Note (Signed)
Chronic. 

## 2019-01-31 NOTE — Patient Instructions (Signed)
Medication Instructions:  Your physician recommends that you continue on your current medications as directed. Please refer to the Current Medication list given to you today.  If you need a refill on your cardiac medications before your next appointment, please call your pharmacy.   Lab work: NONE If you have labs (blood work) drawn today and your tests are completely normal, you will receive your results only by: [name] MyChart Message (if you have MyChart) OR . A paper copy in the mail If you have any lab test that is abnormal or we need to change your treatment, we will call you to review the results.  Testing/Procedures: Your physician has requested that you have a carotid duplex. This test is an ultrasound of the carotid arteries in your neck. It looks at blood flow through these arteries that supply the brain with blood. Allow one hour for this exam. There are no restrictions or special instructions. TO BE SCHEDULED FOR December 2020   Follow-Up: At [facility], you and your health needs are our priority.  As part of our continuing mission to provide you with exceptional heart care, we have created designated Provider Care Teams.  These Care Teams include your primary Cardiologist (physician) and Advanced Practice Providers (APPs -  Physician Assistants and Nurse Practitioners) who all work together to provide you with the care you need, when you need it. . You will need a follow up appointment in 12 months WITH DR. Gwenlyn Found.  Please call our office 2 months in advance to schedule this appointment.    Any Other Special Instructions Will Be Listed Below (If Applicable). You have been cleared at low risk, from a cardiac standpoint, for your upcoming procedure.

## 2019-01-31 NOTE — Progress Notes (Signed)
01/31/2019 Christian Bailey   19-Jan-1941  [id number]  Primary Physician Christian Solian, MD Primary Cardiologist: Christian Harp MD Christian Bailey, [place]  HPI:  Christian Bailey is a 78 y.o.  married Caucasian male, father of 2, grandfather to 1 grandchild who I last saw  06/07/2018.[name] [name] KitchenHis daughter-in-law , Dr. Tiajuana Bailey. , is a primary care physician at [facility]. He has a history of PVOD and stenting of his left common iliac artery 20 years ago. He was smoking 3 packs a day at that time but stopped smoking 20 years ago.   His other problems include hypertension, hyperlipidemia and noninsulin-requiring diabetes. He had a negative Myoview in our office August 18, 2010. Because of claudication, I performed angiograph on him May 12, 2010, revealing an intact left common iliac artery stent, high-grade left external iliac artery stenosis which I stented using a 7 mm x 3 cm long Nitinol self-expanding stent. I also put 2 stents in his right SFA. Followup Dopplers showed improvement but symptomatically he did not really improve much. He still complains of predictable bilateral lower extremity claudication at several hundred yards. His Dopplers have remained stable. Dr. Dagmar Bailey follows his lipid profile which was recently done earlier this month revealing a total cholesterol of 153, LDL of 86 and HDL of 34. Recent lower with Doppler studies performed in October 2014 revealed patent stents with a right ABI of 0.81 and a left of 0.67.over the past year he's noticed progressive but without limiting claudication with recent Dopplers performed 04/04/14 revealing a decline in his right ABI from 0.81-0.53 with progression of disease in his right SFA suggesting "in-stent restenosis.I also performed carotid Doppler studies that suggested moderately severe right internal carotid artery stenosis.   As a result of these studies I performed angiography on him 05/14/14 revealing a patent left external iliac artery  stent with at most 30-40% "in-stent restenosis, and known occluded left SFA with two-vessel runoff (occluded posterior tibial), patent proximal right SFA stent with diffuse "95% in-stent restenosis" within the distal right SFA stent. He had three-vessel runoff on the right. I performed a drug eluting balloon angioplasty for the in-stent restenosis with excellent angiographic and Doppler result although his symptoms did not significantly improve. I also angiogram his carotids because of a moderately severe right ICA stenosis by duplex ultrasound revealing a 50% hypodense right internal carotid artery stenosis with a bovine arch. We have continued to follow these noninvasively. He is neurologically asymptomatic. He denies chest pain or shortness of breath. Since I saw him a year Christian Bailey has noticed increasing claudication bilaterally. We obtained lower extremity Dopplers 12/09/15 revealing a decline in his right ABI from 0.85 down to 0.68 and occluded distal right SFA. We did begin him on Pletal which she has taken for last several months with minimal benefit.I angiogram 1010/23/17 revealing a widely patent left iliac stent, occluded left SFA with two-vessel runoff and a occluded right SFA with two-vessel runoff. I did not think he had percutaneous options for revascularization and hetherefore underwent right femoropopliteal bypass grafting by Dr. Kimberlee Bailey of this year. He's been reinjured them twice demonstrating a patent graft despite the fact that his symptoms have gotten worse. He is also had a reverse right shoulder replacement by Dr.Norrisin June of this 2019.  Since I saw him in December he has undergone aortobifemoral bypass grafting by Dr. Trula Bailey but has had minimal benefit with regards to claudication.  He has had a PERC drain put in his gallbladder  which has had for the last 8 months and recent mission for a gastrointestinal infectious illness.  He denies chest pain or shortness of breath.  He  is scheduled for potential cholecystectomy in the upcoming future which she will be cleared for at low risk given his recent low risk Myoview in December and normal 2D echocardiogram.  Current Meds  Medication Sig  . cefdinir (OMNICEF) 300 MG capsule Take 1 capsule (300 mg total) by mouth 2 (two) times daily for 10 days.  . clopidogrel (PLAVIX) 75 MG tablet TAKE 1 TABLET DAILY (Patient taking differently: Take 75 mg by mouth daily. )  . co-enzyme Q-10 30 MG capsule Take 30 mg by mouth 3 (three) times daily.  [name] lisinopril (ZESTRIL) 5 MG tablet Take 5 mg by mouth 2 (two) times daily.  . metoprolol tartrate (LOPRESSOR) 25 MG tablet Take 1 tablet (25 mg total) by mouth 2 (two) times daily.  . pantoprazole (PROTONIX) 40 MG tablet TAKE 1 TABLET TWICE A DAY (Patient taking differently: Take 40 mg by mouth 2 (two) times daily. )  . rosuvastatin (CRESTOR) 20 MG tablet Take 20 mg by mouth every evening.      Allergies  Allergen Reactions  . Asa [Aspirin] Other (See Comments)    GI bleeding  . Gadolinium Derivatives Itching, Swelling and Other (See Comments)    Pt complained of face flushing/hottness and throat tightness/scratchiness immediately after the injections.  Within 4 minutes, all symptoms were gone and the study was completed.  No further complications or signs of allergy were exhibited after completion of study.   . Oxycodone-Acetaminophen Other (See Comments)    Dizziness and feeling of being uncomfortable     Social History   Socioeconomic History  . Marital status: Married    Spouse name: Not on file  . Number of children: 2  . Years of education: Not on file  . Highest education level: Not on file  Occupational History  . Occupation: retired    Fish farm manager: RETIRED  Social Needs  . Financial resource strain: Not on file  . Food insecurity    Worry: Not on file    Inability: Not on file  . Transportation needs    Medical: Not on file    Non-medical: Not on file  Tobacco Use   . Smoking status: Former Smoker    Years: 25.00    Quit date: 06/1988    Years since quitting: 30.6  . Smokeless tobacco: Never Used  Substance and Sexual Activity  . Alcohol use: Yes    Comment: rarely  . Drug use: No  . Sexual activity: Not on file  Lifestyle  . Physical activity    Days per week: Not on file    Minutes per session: Not on file  . Stress: Not on file  Relationships  . Social Herbalist on phone: Not on file    Gets together: Not on file    Attends religious service: Not on file    Active member of club or organization: Not on file    Attends meetings of clubs or organizations: Not on file    Relationship status: Not on file  . Intimate partner violence    Fear of current or ex partner: Not on file    Emotionally abused: Not on file    Physically abused: Not on file    Forced sexual activity: Not on file  Other Topics Concern  . Not on file  Social  History Narrative  . Not on file     Review of Systems: General: negative for chills, fever, night sweats or weight changes.  Cardiovascular: negative for chest pain, dyspnea on exertion, edema, orthopnea, palpitations, paroxysmal nocturnal dyspnea or shortness of breath Dermatological: negative for rash Respiratory: negative for cough or wheezing Urologic: negative for hematuria Abdominal: negative for nausea, vomiting, diarrhea, bright red blood per rectum, melena, or hematemesis Neurologic: negative for visual changes, syncope, or dizziness All other systems reviewed and are otherwise negative except as noted above.    Blood pressure (!) 108/56, pulse 93, temperature (!) 97.3 F (36.3 C), height 5\' 5"  (1.651 m), weight 136 lb (61.7 kg), SpO2 98 %.  General appearance: alert and no distress Neck: no adenopathy, no JVD, supple, symmetrical, trachea midline, thyroid not enlarged, symmetric, no tenderness/mass/nodules and Bilateral carotid bruits Lungs: clear to auscultation bilaterally Heart:  regular rate and rhythm, S1, S2 normal, no murmur, click, rub or gallop  EKG not performed today  ASSESSMENT AND PLAN:   Essential hypertension History of essential hypertension the blood pressure measured today at 108/56.  He is on lisinopril, metoprolol  PAD (peripheral artery disease) (HCC) History of PAD with with multiple interventions in the past by myself including iliac stenting, SFA intervention, right femoropopliteal bypass grafting with revision and ultimately aortobifemoral bypass grafting this past January by Dr. Trula Bailey who follows his lower extremity Doppler studies.  He does continue to have lifestyle limiting claudication however.  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  Carotid artery disease History of carotid artery disease with Dopplers performed 12/19 revealing moderate right and mild left ICA stenosis.  This will be repeated this coming December.      Christian Harp MD FACP,FACC,FAHA, [facility] 01/31/2019 9:57 AM

## 2019-01-31 NOTE — Assessment & Plan Note (Signed)
History of hyperlipidemia on Crestor with lipid profile performed 04/12/2018 revealing a total cholesterol 126, LDL 57 and HDL 33.

## 2019-01-31 NOTE — Assessment & Plan Note (Signed)
History of carotid artery disease with Dopplers performed 12/19 revealing moderate right and mild left ICA stenosis.  This will be repeated this coming December.

## 2019-01-31 NOTE — Assessment & Plan Note (Signed)
History of PAD with with multiple interventions in the past by myself including iliac stenting, SFA intervention, right femoropopliteal bypass grafting with revision and ultimately aortobifemoral bypass grafting this past January by [name] who follows his lower extremity Doppler studies.  He does continue to have lifestyle limiting claudication however.

## 2019-02-01 ENCOUNTER — Other Ambulatory Visit: Payer: Self-pay | Admitting: Surgery

## 2019-02-01 ENCOUNTER — Encounter: Payer: Self-pay | Admitting: *Deleted

## 2019-02-01 ENCOUNTER — Ambulatory Visit
Admit: 2019-02-01 | Discharge: 2019-02-01 | Disposition: A | Discharge status: Home / Self Care | Payer: Medicare Other | Attending: Interventional Radiology | Admitting: Interventional Radiology

## 2019-02-01 DIAGNOSIS — Z4682 Encounter for fitting and adjustment of non-vascular catheter: Secondary | ICD-10-CM | POA: Diagnosis not present

## 2019-02-01 DIAGNOSIS — K819 Cholecystitis, unspecified: Secondary | ICD-10-CM

## 2019-02-01 DIAGNOSIS — K810 Acute cholecystitis: Secondary | ICD-10-CM | POA: Diagnosis not present

## 2019-02-01 HISTORY — PX: IR RADIOLOGIST EVAL & MGMT: IMG5224

## 2019-02-01 NOTE — Progress Notes (Signed)
Referring Physician(s): Dr Deland Pretty  Chief Complaint: The patient is seen in follow up today s/p percutaneous cholecystostomy drain placement 07/19/18   History of present illness:  History of acute cholecystitis, post image guided cholecystostomy tube placement 07/19/2018.  01/17/19: Injection: IMPRESSION: 1. Appropriately positioned and functioning cholecystostomy tube. 2. No evidence of cholelithiasis or choledocholithiasis.  7/26: Patient was scheduled for routine fluoroscopic guided exchange as existing cholecystostomy tube has not been exchanged since the time of the initial placement in January of this year however patient developed bacteremia following the cholangiogram.  Kept to bag- not capped secondary bacteremia Taking Omnicef 300 mg BID-- to finish in 2 days  Denies pain; N/V Denies fever/chills  OP bilious-- maybe 30-50 cc daily  Scheduled today for injection/cholangiogram If shows GB emptying normally to duodenum-- will cap.  Past Medical History:  Diagnosis Date  . Anemia   . Complication of anesthesia   . Diabetes mellitus ([place])    TYPE 2  . GERD (gastroesophageal reflux disease)   . History of blood transfusion    GI bleed  . History of colon polyps   . History of hiatal hernia   . Hyperlipemia   . Hypertension   . Osteoarthritis   . PAD (peripheral artery disease) ([facility])    a. stenting of his left common iliac artery >20 years ago. b. h/o LEIA stent and 2 stents to R SFA in 2011. c. 04/2014:  s/p PTA of right SFA for in-stent restenosis, occluded left SFA  . PONV (postoperative nausea and vomiting)    no porblem with the last 3 surgeries  . RBBB (right bundle branch block with left anterior fascicular block)    NUCLEAR STRESS TEST, 08/18/2010 - no significant wall motion abnoramlities noted, post-stress EF 69%, normal myocardial perfusion study  . Sinus tachycardia    a. Noted during admission 04/2014 but upon review seems to be frequent finding  for patient.  . Stenosis of carotid artery    a. 50% right carotid stenosis by angiogram 04/2014.    Past Surgical History:  Procedure Laterality Date  . ABDOMINAL AORTOGRAM W/LOWER EXTREMITY N/A 01/27/2017   Procedure: Abdominal Aortogram w/Lower Extremity;  Surgeon: Serafina Mitchell, MD;  Location: [place] [facility];  Service: Cardiovascular;  Laterality: N/A;  . ABDOMINAL AORTOGRAM W/LOWER EXTREMITY N/A 06/08/2017   Procedure: ABDOMINAL AORTOGRAM W/LOWER EXTREMITY;  Surgeon: Serafina Mitchell, MD;  Location: [place] [facility];  Service: Cardiovascular;  Laterality: N/A;  . ABDOMINAL AORTOGRAM W/LOWER EXTREMITY N/A 08/31/2017   Procedure: ABDOMINAL AORTOGRAM W/LOWER EXTREMITY;  Surgeon: Serafina Mitchell, MD;  Location: [place] [facility];  Service: Cardiovascular;  Laterality: N/A;  rt. unilateral  . ANGIOPLASTY / STENTING FEMORAL    . ANGIOPLASTY / STENTING ILIAC    . AORTA - BILATERAL FEMORAL ARTERY BYPASS GRAFT N/A 07/06/2018   Procedure: AORTA BIFEMORAL BYPASS GRAFT USING 14X7MM X 40CM HEMASHIELD GOLD GRAFT;  Surgeon: Serafina Mitchell, MD;  Location: [place];  Service: Vascular;  Laterality: N/A;  . CEREBRAL ANGIOGRAM N/A 05/14/2014   Procedure: CEREBRAL ANGIOGRAM;  Surgeon: Lorretta Harp, MD;  Location: [facility] CATH LAB;  Service: Cardiovascular;  Laterality: N/A;  . COLONOSCOPY W/ POLYPECTOMY    . ENDARTERECTOMY Right 09/08/2017  . ENDARTERECTOMY FEMORAL Right 09/08/2017   Procedure: REDO RIGHT FEMORAL ENDARTECTOMY WITH PATCH ANGIOPLASTY.;  Surgeon: Serafina Mitchell, MD;  Location: [place];  Service: Vascular;  Laterality: Right;  . EYE SURGERY Bilateral    cataract  .  FEMORAL ARTERY STENT Right 05/12/2010   Stented distally with a 6x100 Abbott absolute stent and proximally with a 6x60 Cook Zilver stent resulting in the reduction of the proximal segment 80% and mid segment 60-70% to 0% residual, LEFT common femoral artery stented with a 7x3 Smart stent resulting in reduction of 90% stenosis to  0% residual  . FEMORAL-POPLITEAL BYPASS GRAFT Right 09/18/2016   Procedure: BYPASS GRAFT FEMORAL-POPLITEAL ARTERY;  Surgeon: Serafina Mitchell, MD;  Location: [place];  Service: Vascular;  Laterality: Right;  . FEMORAL-POPLITEAL BYPASS GRAFT Right 09/08/2017   Procedure: REDO BYPASS GRAFT FEMORAL-POPLITEAL ARTERY;  Surgeon: Serafina Mitchell, MD;  Location: [place];  Service: Vascular;  Laterality: Right;  . FEMORAL-POPLITEAL BYPASS GRAFT Right 07/06/2018   Procedure: REVISION RIGHT FEMORAL TO POPLITEAL ARTERY BYPASS GRAFT;  Surgeon: Serafina Mitchell, MD;  Location: [place];  Service: Vascular;  Laterality: Right;  . IR CHOLANGIOGRAM EXISTING TUBE  08/04/2018  . IR EXCHANGE BILIARY DRAIN  01/22/2019  . IR PERC CHOLECYSTOSTOMY  07/19/2018  . IR RADIOLOGIST EVAL & MGMT  08/31/2018  . IR RADIOLOGIST EVAL & MGMT  01/17/2019  . KNEE ARTHROSCOPY     left  . LOWER EXTREMITY ANGIOGRAM N/A 05/14/2014   Procedure: LOWER EXTREMITY ANGIOGRAM;  Surgeon: Lorretta Harp, MD;  Location: [facility] CATH LAB;  Service: Cardiovascular;  Laterality: N/A;  . LOWER EXTREMITY ANGIOGRAPHY N/A 09/21/2016   Procedure: Lower Extremity Angiography;  Surgeon: Conrad Downing, MD;  Location: [place] [facility];  Service: Cardiovascular;  Laterality: N/A;  . PERIPHERAL VASCULAR ATHERECTOMY Right 01/27/2017   Procedure: PERIPHERAL VASCULAR ATHERECTOMY;  Surgeon: Serafina Mitchell, MD;  Location: [place] [facility];  Service: Cardiovascular;  Laterality: Right;  . PERIPHERAL VASCULAR BALLOON ANGIOPLASTY Right 06/08/2017   Procedure: PERIPHERAL VASCULAR BALLOON ANGIOPLASTY;  Surgeon: Serafina Mitchell, MD;  Location: [place] [facility];  Service: Cardiovascular;  Laterality: Right;  common femoral and superficial femoral arteries  . PERIPHERAL VASCULAR CATHETERIZATION N/A 04/20/2016   Procedure: Lower Extremity Intervention;  Surgeon: Lorretta Harp, MD;  Location: [place] [facility];  Service: Cardiovascular;  Laterality: N/A;  . PERIPHERAL VASCULAR  INTERVENTION  08/31/2017   Procedure: PERIPHERAL VASCULAR INTERVENTION;  Surgeon: Serafina Mitchell, MD;  Location: [place] [facility];  Service: Cardiovascular;;  REIA  . REVERSE SHOULDER ARTHROPLASTY Right 12/07/2016  . REVERSE SHOULDER ARTHROPLASTY Right 12/07/2016   Procedure: REVERSE SHOULDER ARTHROPLASTY;  Surgeon: Netta Cedars, MD;  Location: [place];  Service: Orthopedics;  Laterality: Right;  . ROTATOR CUFF REPAIR Right 2003  . SFA Right 05/14/2014   PTA  OF RT SFA         DR BERRY    Allergies: Asa [aspirin], Gadolinium derivatives, and Oxycodone-acetaminophen  Medications: Prior to Admission medications   Medication Sig Start Date End Date Taking? Authorizing Provider  cefdinir (OMNICEF) 300 MG capsule Take 1 capsule (300 mg total) by mouth 2 (two) times daily for 10 days. 01/24/19 02/03/19  Kinnie Feil, MD  clopidogrel (PLAVIX) 75 MG tablet TAKE 1 TABLET DAILY Patient taking differently: Take 75 mg by mouth daily.  08/29/18   Lorretta Harp, MD  co-enzyme Q-10 30 MG capsule Take 30 mg by mouth 3 (three) times daily.    [provider]  lisinopril (ZESTRIL) 5 MG tablet Take 5 mg by mouth 2 (two) times daily.    [provider]  metoprolol tartrate (LOPRESSOR) 25 MG tablet Take 1 tablet (25 mg total) by  mouth 2 (two) times daily. 07/29/18   Ulyses Amor, PA-C  pantoprazole (PROTONIX) 40 MG tablet TAKE 1 TABLET TWICE A DAY Patient taking differently: Take 40 mg by mouth 2 (two) times daily.  12/20/18   Lorretta Harp, MD  rosuvastatin (CRESTOR) 20 MG tablet Take 20 mg by mouth every evening.     [provider]     Family History  Problem Relation Age of Onset  . Heart disease Mother   . Leukemia Mother   . Stomach cancer Father   . Esophageal cancer Brother   . Liver disease Brother   . Alcoholism Brother   . Leukemia Brother   . Leukemia Brother   . Diabetes type II Brother   . Lung disease Sister     Social History   Socioeconomic  History  . Marital status: Married    Spouse name: Not on file  . Number of children: 2  . Years of education: Not on file  . Highest education level: Not on file  Occupational History  . Occupation: retired    Fish farm manager: RETIRED  Social Needs  . Financial resource strain: Not on file  . Food insecurity    Worry: Not on file    Inability: Not on file  . Transportation needs    Medical: Not on file    Non-medical: Not on file  Tobacco Use  . Smoking status: Former Smoker    Years: 25.00    Quit date: 06/1988    Years since quitting: 30.6  . Smokeless tobacco: Never Used  Substance and Sexual Activity  . Alcohol use: Yes    Comment: rarely  . Drug use: No  . Sexual activity: Not on file  Lifestyle  . Physical activity    Days per week: Not on file    Minutes per session: Not on file  . Stress: Not on file  Relationships  . Social Herbalist on phone: Not on file    Gets together: Not on file    Attends religious service: Not on file    Active member of club or organization: Not on file    Attends meetings of clubs or organizations: Not on file    Relationship status: Not on file  Other Topics Concern  . Not on file  Social History Narrative  . Not on file     Vital Signs: There were no vitals taken for this visit.  Physical Exam Vitals signs reviewed.  Skin:    General: Skin is warm and dry.     Comments: Site of drain is clean and dry NT no bleeding No sign of infection Injection reveals patent cystic duct-- empties to duodenum per Dr Charletta Cousin capped  Neurological:     Mental Status: He is alert.     Imaging: No results found.  Labs:  CBC: Recent Labs    01/20/19 0329 01/21/19 0317 01/22/19 0224 01/23/19 0249  WBC 7.4 5.4 5.3 5.1  HGB 8.4* 8.8* 8.9* 8.0*  HCT 26.0* 26.8* 26.8* 24.2*  PLT 152 160 179 185    COAGS: Recent Labs    07/01/18 0926 07/06/18 1600 08/03/18 1848  INR 1.04 1.25 1.05  APTT 30 27  --     BMP:  Recent Labs    01/20/19 0329 01/21/19 0317 01/22/19 0224 01/23/19 0249  NA 138 138 139 139  K 4.9 4.2 4.1 3.9  CL 107 107 107 106  CO2 21* 20* 22  25  GLUCOSE 108* 111* 127* 163*  BUN 37* 26* 18 18  CALCIUM 8.3* 8.6* 8.7* 8.5*  CREATININE 1.87* 1.59* 1.58* 1.48*  GFRNONAA 34* 41* 42* 45*  GFRAA 39* 48* 48* 52*    LIVER FUNCTION TESTS: Recent Labs    01/18/19 1728 01/19/19 1843 01/20/19 0329 01/23/19 0249  BILITOT 0.7 0.5 0.5 0.3  AST 26 32 25 20  ALT 17 22 21 18   ALKPHOS 90 76 72 87  PROT 6.8 6.1* 5.7* 5.9*  ALBUMIN 3.7 3.3* 3.0* 2.8*    Assessment:  Cholecystostomy drain intact Injection today does reveal cystic duct patency and normal emptying to duodenum Capped drain for now per Dr Pascal Lux order Return 1 week If injection continues to show good patency and emptying-- will remove drain. Pt and wife aware: If pt develops pain; fever; N/V--- must re connect drain to bag and call Dr Dema Severin office. Wife has good understanding of plan and good understanding of directions for reconnection to bag if needed. She was able to redemonstrate reconnection procedure   Signed: Lavonia Drafts, PA-C 02/01/2019, 2:42 PM   Please refer to Dr. Pascal Lux attestation of this note for management and plan.

## 2019-02-02 DIAGNOSIS — L602 Onychogryphosis: Secondary | ICD-10-CM | POA: Diagnosis not present

## 2019-02-02 DIAGNOSIS — E1351 Other specified diabetes mellitus with diabetic peripheral angiopathy without gangrene: Secondary | ICD-10-CM | POA: Diagnosis not present

## 2019-02-06 DIAGNOSIS — N183 Chronic kidney disease, stage 3 (moderate): Secondary | ICD-10-CM | POA: Diagnosis not present

## 2019-02-08 ENCOUNTER — Encounter: Payer: Self-pay | Admitting: Radiology

## 2019-02-08 ENCOUNTER — Ambulatory Visit
Admission: RE | Admit: 2019-02-08 | Discharge: 2019-02-08 | Disposition: A | Discharge status: Home / Self Care | Payer: Medicare Other | Source: Ambulatory Visit | Attending: Surgery | Admitting: Surgery

## 2019-02-08 DIAGNOSIS — K819 Cholecystitis, unspecified: Secondary | ICD-10-CM | POA: Diagnosis not present

## 2019-02-08 DIAGNOSIS — K810 Acute cholecystitis: Secondary | ICD-10-CM | POA: Diagnosis not present

## 2019-02-08 DIAGNOSIS — Z9889 Other specified postprocedural states: Secondary | ICD-10-CM | POA: Diagnosis not present

## 2019-02-08 HISTORY — PX: IR RADIOLOGIST EVAL & MGMT: IMG5224

## 2019-02-08 NOTE — Progress Notes (Signed)
Referring Physician(s): White,Christopher M  Chief Complaint: The patient is seen in follow up today s/p History of acute cholecystitis, post image guided cholecystostomy tube placement 07/19/2018.  History of present illness:  Pt was seen in [facility] just 8/5 Here today for follow up  8/5/note: PLAN: - The patient's cholecystostomy tube was capped for a trial of internalization. - The patient was given an extra gravity bag and instructed to reconnect the cholecystostomy tube to the gravity bag if he were to experience recurrent right upper quadrant obstructive symptoms.  - The patient was instructed to complete his prescribed course of antibiotics.  The patient demonstrated fair understanding of this discussion and was instructed to call the interventional radiology drain clinic with any interval questions or concerns. As previously discussed with referring surgeon, Dr. Dema Severin, assuming the patient passes his trial of internalization/capping, he will return to the interventional radiology drain clinic in 1 week for repeat cholangiogram and potential cholecystostomy tube removal.  Capping trial has been successful Denies N/V or fever/chills Denies abd pain Finished antibiotic early this week To see Dr Dema Severin later this week   Past Medical History:  Diagnosis Date  . Anemia   . Complication of anesthesia   . Diabetes mellitus ([place])    TYPE 2  . GERD (gastroesophageal reflux disease)   . History of blood transfusion    GI bleed  . History of colon polyps   . History of hiatal hernia   . Hyperlipemia   . Hypertension   . Osteoarthritis   . PAD (peripheral artery disease) (HCC)    a. stenting of his left common iliac artery >20 years ago. b. h/o LEIA stent and 2 stents to R SFA in 2011. c. 04/2014:  s/p PTA of right SFA for in-stent restenosis, occluded left SFA  . PONV (postoperative nausea and vomiting)    no porblem with the last 3 surgeries  . RBBB (right bundle  branch block with left anterior fascicular block)    NUCLEAR STRESS TEST, 08/18/2010 - no significant wall motion abnoramlities noted, post-stress EF 69%, normal myocardial perfusion study  . Sinus tachycardia    a. Noted during admission 04/2014 but upon review seems to be frequent finding for patient.  . Stenosis of carotid artery    a. 50% right carotid stenosis by angiogram 04/2014.    Past Surgical History:  Procedure Laterality Date  . ABDOMINAL AORTOGRAM W/LOWER EXTREMITY N/A 01/27/2017   Procedure: Abdominal Aortogram w/Lower Extremity;  Surgeon: Serafina Mitchell, MD;  Location: [place] [facility];  Service: Cardiovascular;  Laterality: N/A;  . ABDOMINAL AORTOGRAM W/LOWER EXTREMITY N/A 06/08/2017   Procedure: ABDOMINAL AORTOGRAM W/LOWER EXTREMITY;  Surgeon: Serafina Mitchell, MD;  Location: [place] [facility];  Service: Cardiovascular;  Laterality: N/A;  . ABDOMINAL AORTOGRAM W/LOWER EXTREMITY N/A 08/31/2017   Procedure: ABDOMINAL AORTOGRAM W/LOWER EXTREMITY;  Surgeon: Serafina Mitchell, MD;  Location: [place] [facility];  Service: Cardiovascular;  Laterality: N/A;  rt. unilateral  . ANGIOPLASTY / STENTING FEMORAL    . ANGIOPLASTY / STENTING ILIAC    . AORTA - BILATERAL FEMORAL ARTERY BYPASS GRAFT N/A 07/06/2018   Procedure: AORTA BIFEMORAL BYPASS GRAFT USING 14X7MM X 40CM HEMASHIELD GOLD GRAFT;  Surgeon: Serafina Mitchell, MD;  Location: [place];  Service: Vascular;  Laterality: N/A;  . CEREBRAL ANGIOGRAM N/A 05/14/2014   Procedure: CEREBRAL ANGIOGRAM;  Surgeon: Lorretta Harp, MD;  Location: [facility] CATH LAB;  Service: Cardiovascular;  Laterality: N/A;  .  COLONOSCOPY W/ POLYPECTOMY    . ENDARTERECTOMY Right 09/08/2017  . ENDARTERECTOMY FEMORAL Right 09/08/2017   Procedure: REDO RIGHT FEMORAL ENDARTECTOMY WITH PATCH ANGIOPLASTY.;  Surgeon: Serafina Mitchell, MD;  Location: [place];  Service: Vascular;  Laterality: Right;  . EYE SURGERY Bilateral    cataract  . FEMORAL ARTERY STENT Right 05/12/2010    Stented distally with a 6x100 Abbott absolute stent and proximally with a 6x60 Cook Zilver stent resulting in the reduction of the proximal segment 80% and mid segment 60-70% to 0% residual, LEFT common femoral artery stented with a 7x3 Smart stent resulting in reduction of 90% stenosis to 0% residual  . FEMORAL-POPLITEAL BYPASS GRAFT Right 09/18/2016   Procedure: BYPASS GRAFT FEMORAL-POPLITEAL ARTERY;  Surgeon: Serafina Mitchell, MD;  Location: [place];  Service: Vascular;  Laterality: Right;  . FEMORAL-POPLITEAL BYPASS GRAFT Right 09/08/2017   Procedure: REDO BYPASS GRAFT FEMORAL-POPLITEAL ARTERY;  Surgeon: Serafina Mitchell, MD;  Location: [place];  Service: Vascular;  Laterality: Right;  . FEMORAL-POPLITEAL BYPASS GRAFT Right 07/06/2018   Procedure: REVISION RIGHT FEMORAL TO POPLITEAL ARTERY BYPASS GRAFT;  Surgeon: Serafina Mitchell, MD;  Location: [place];  Service: Vascular;  Laterality: Right;  . IR CHOLANGIOGRAM EXISTING TUBE  08/04/2018  . IR EXCHANGE BILIARY DRAIN  01/22/2019  . IR PERC CHOLECYSTOSTOMY  07/19/2018  . IR RADIOLOGIST EVAL & MGMT  08/31/2018  . IR RADIOLOGIST EVAL & MGMT  01/17/2019  . IR RADIOLOGIST EVAL & MGMT  02/01/2019  . KNEE ARTHROSCOPY     left  . LOWER EXTREMITY ANGIOGRAM N/A 05/14/2014   Procedure: LOWER EXTREMITY ANGIOGRAM;  Surgeon: Lorretta Harp, MD;  Location: [facility] CATH LAB;  Service: Cardiovascular;  Laterality: N/A;  . LOWER EXTREMITY ANGIOGRAPHY N/A 09/21/2016   Procedure: Lower Extremity Angiography;  Surgeon: Conrad [place], MD;  Location: [place] [facility];  Service: Cardiovascular;  Laterality: N/A;  . PERIPHERAL VASCULAR ATHERECTOMY Right 01/27/2017   Procedure: PERIPHERAL VASCULAR ATHERECTOMY;  Surgeon: Serafina Mitchell, MD;  Location: [place] [facility];  Service: Cardiovascular;  Laterality: Right;  . PERIPHERAL VASCULAR BALLOON ANGIOPLASTY Right 06/08/2017   Procedure: PERIPHERAL VASCULAR BALLOON ANGIOPLASTY;  Surgeon: Serafina Mitchell, MD;  Location: [place] [facility]  LAB;  Service: Cardiovascular;  Laterality: Right;  common femoral and superficial femoral arteries  . PERIPHERAL VASCULAR CATHETERIZATION N/A 04/20/2016   Procedure: Lower Extremity Intervention;  Surgeon: Lorretta Harp, MD;  Location: [place] [facility];  Service: Cardiovascular;  Laterality: N/A;  . PERIPHERAL VASCULAR INTERVENTION  08/31/2017   Procedure: PERIPHERAL VASCULAR INTERVENTION;  Surgeon: Serafina Mitchell, MD;  Location: [place] [facility];  Service: Cardiovascular;;  REIA  . REVERSE SHOULDER ARTHROPLASTY Right 12/07/2016  . REVERSE SHOULDER ARTHROPLASTY Right 12/07/2016   Procedure: REVERSE SHOULDER ARTHROPLASTY;  Surgeon: Netta Cedars, MD;  Location: [place];  Service: Orthopedics;  Laterality: Right;  . ROTATOR CUFF REPAIR Right 2003  . SFA Right 05/14/2014   PTA  OF RT SFA         DR BERRY    Allergies: Asa [aspirin], Gadolinium derivatives, and Oxycodone-acetaminophen  Medications: Prior to Admission medications   Medication Sig Start Date End Date Taking? Authorizing Provider  clopidogrel (PLAVIX) 75 MG tablet TAKE 1 TABLET DAILY Patient taking differently: Take 75 mg by mouth daily.  08/29/18   Lorretta Harp, MD  co-enzyme Q-10 30 MG capsule Take 30 mg by mouth 3 (three) times daily.    [provider]  lisinopril (ZESTRIL) 5 MG tablet Take 5 mg by mouth 2 (two) times daily.    [provider]  metoprolol tartrate (LOPRESSOR) 25 MG tablet Take 1 tablet (25 mg total) by mouth 2 (two) times daily. 07/29/18   Ulyses Amor, PA-C  pantoprazole (PROTONIX) 40 MG tablet TAKE 1 TABLET TWICE A DAY Patient taking differently: Take 40 mg by mouth 2 (two) times daily.  12/20/18   Lorretta Harp, MD  rosuvastatin (CRESTOR) 20 MG tablet Take 20 mg by mouth every evening.     [provider]     Family History  Problem Relation Age of Onset  . Heart disease Mother   . Leukemia Mother   . Stomach cancer Father   . Esophageal cancer Brother   .  Liver disease Brother   . Alcoholism Brother   . Leukemia Brother   . Leukemia Brother   . Diabetes type II Brother   . Lung disease Sister     Social History   Socioeconomic History  . Marital status: Married    Spouse name: Not on file  . Number of children: 2  . Years of education: Not on file  . Highest education level: Not on file  Occupational History  . Occupation: retired    Fish farm manager: RETIRED  Social Needs  . Financial resource strain: Not on file  . Food insecurity    Worry: Not on file    Inability: Not on file  . Transportation needs    Medical: Not on file    Non-medical: Not on file  Tobacco Use  . Smoking status: Former Smoker    Years: 25.00    Quit date: 06/1988    Years since quitting: 30.6  . Smokeless tobacco: Never Used  Substance and Sexual Activity  . Alcohol use: Yes    Comment: rarely  . Drug use: No  . Sexual activity: Not on file  Lifestyle  . Physical activity    Days per week: Not on file    Minutes per session: Not on file  . Stress: Not on file  Relationships  . Social Herbalist on phone: Not on file    Gets together: Not on file    Attends religious service: Not on file    Active member of club or organization: Not on file    Attends meetings of clubs or organizations: Not on file    Relationship status: Not on file  Other Topics Concern  . Not on file  Social History Narrative  . Not on file     Vital Signs: BP (!) 102/55   Pulse 86   Temp (!) 97.3 F (36.3 C)   SpO2 94%   Physical Exam Vitals signs reviewed.  Skin:    General: Skin is warm and dry.     Comments: Site of drain is clean and dry NT no bleeding No sign of infection  Injection today shows patency of drain; patent CBD and flow to duodenum. No evidence of cholelithiasis or choledocholithiasis per Dr Laurence Ferrari  Removed drain without complication per Dr Laurence Ferrari order  Neurological:     Mental Status: He is alert and oriented to  person, place, and time.     Imaging: No results found.  Labs:  CBC: Recent Labs    01/20/19 0329 01/21/19 0317 01/22/19 0224 01/23/19 0249  WBC 7.4 5.4 5.3 5.1  HGB 8.4* 8.8* 8.9* 8.0*  HCT 26.0* 26.8* 26.8* 24.2*  PLT  152 160 179 185    COAGS: Recent Labs    07/01/18 0926 07/06/18 1600 08/03/18 1848  INR 1.04 1.25 1.05  APTT 30 27  --     BMP: Recent Labs    01/20/19 0329 01/21/19 0317 01/22/19 0224 01/23/19 0249  NA 138 138 139 139  K 4.9 4.2 4.1 3.9  CL 107 107 107 106  CO2 21* 20* 22 25  GLUCOSE 108* 111* 127* 163*  BUN 37* 26* 18 18  CALCIUM 8.3* 8.6* 8.7* 8.5*  CREATININE 1.87* 1.59* 1.58* 1.48*  GFRNONAA 34* 41* 42* 45*  GFRAA 39* 48* 48* 52*    LIVER FUNCTION TESTS: Recent Labs    01/18/19 1728 01/19/19 1843 01/20/19 0329 01/23/19 0249  BILITOT 0.7 0.5 0.5 0.3  AST 26 32 25 20  ALT 17 22 21 18   ALKPHOS 90 76 72 87  PROT 6.8 6.1* 5.7* 5.9*  ALBUMIN 3.7 3.3* 3.0* 2.8*    Assessment:  Removal of cholecystostomy drain tube Tolerated well To follow with Dr Deland Pretty Plan per [facility]  Signed: Lavonia Drafts, PA-C 02/08/2019, 1:18 PM   Please refer to Dr. Laurence Ferrari  attestation of this note for management and plan.

## 2019-02-20 ENCOUNTER — Encounter (HOSPITAL_COMMUNITY): Payer: Medicare Other

## 2019-02-20 ENCOUNTER — Ambulatory Visit: Payer: Medicare Other | Admitting: Surgery

## 2019-02-20 DIAGNOSIS — K819 Cholecystitis, unspecified: Secondary | ICD-10-CM | POA: Diagnosis not present

## 2019-02-24 ENCOUNTER — Other Ambulatory Visit: Payer: Self-pay | Admitting: Cardiovascular Disease

## 2019-03-10 ENCOUNTER — Other Ambulatory Visit: Payer: Self-pay

## 2019-03-10 DIAGNOSIS — I739 Peripheral vascular disease, unspecified: Secondary | ICD-10-CM

## 2019-03-13 ENCOUNTER — Ambulatory Visit (HOSPITAL_COMMUNITY)
Admission: RE | Admit: 2019-03-13 | Discharge: 2019-03-13 | Disposition: A | Discharge status: Home / Self Care | Payer: Medicare Other | Source: Ambulatory Visit | Attending: Family | Admitting: Family

## 2019-03-13 ENCOUNTER — Ambulatory Visit (INDEPENDENT_AMBULATORY_CARE_PROVIDER_SITE_OTHER): Payer: Medicare Other | Admitting: Surgery

## 2019-03-13 ENCOUNTER — Other Ambulatory Visit: Payer: Self-pay

## 2019-03-13 ENCOUNTER — Encounter: Payer: Self-pay | Admitting: Surgery

## 2019-03-13 ENCOUNTER — Ambulatory Visit (INDEPENDENT_AMBULATORY_CARE_PROVIDER_SITE_OTHER)
Admission: RE | Admit: 2019-03-13 | Discharge: 2019-03-13 | Disposition: A | Discharge status: Home / Self Care | Payer: Medicare Other | Source: Ambulatory Visit | Attending: Family | Admitting: Family

## 2019-03-13 VITALS — BP 129/66 | HR 86 | Temp 97.4°F | Resp 18 | Ht 65.0 in | Wt 136.0 lb

## 2019-03-13 DIAGNOSIS — I739 Peripheral vascular disease, unspecified: Secondary | ICD-10-CM

## 2019-03-13 DIAGNOSIS — I70213 Atherosclerosis of native arteries of extremities with intermittent claudication, bilateral legs: Secondary | ICD-10-CM | POA: Diagnosis not present

## 2019-03-13 NOTE — Progress Notes (Signed)
[facility]ist of [place]  Patient name: Christian Bailey MRN: [id number] DOB: [birth year] Sex: male   REASON FOR VISIT:    Follow up  HISOTRY OF PRESENT ILLNESS:    Christian Bailey a 78 y.o.malewho returns today for follow-up. He initially underwent right femoral-popliteal bypass graft with vein on 09/18/2016 for claudication. He has undergone percutaneous as well as surgical revisions of his proximal bypass. He still is having challenges with claudication. Ultimately I felt his inflow was the issue and so on 07/06/2018 he underwent an aortobifemoral bypass graft. Also revised the proximal portion of his right femoropopliteal bypass with an interposition Gore-Tex. He had a prolonged hospital course, lasting several weeks which ultimately was related to acute cholecystitis. He had a percutaneous cholecystostomy tube placed. He is back today for follow-up. He is at home. He is walking now approximately 1200 steps. Prior to surgery he was walking 200 steps.  His cholecystostomy tube has been removed.  He tells me that he feels satisfied about where he is at.  He is still having trouble gaining weight.   PAST MEDICAL HISTORY:   Past Medical History:  Diagnosis Date  . Anemia   . Complication of anesthesia   . Diabetes mellitus ([place])    TYPE 2  . GERD (gastroesophageal reflux disease)   . History of blood transfusion    GI bleed  . History of colon polyps   . History of hiatal hernia   . Hyperlipemia   . Hypertension   . Osteoarthritis   . PAD (peripheral artery disease) (HCC)    a. stenting of his left common iliac artery >20 years ago. b. h/o LEIA stent and 2 stents to R SFA in 2011. c. 04/2014:  s/p PTA of right SFA for in-stent restenosis, occluded left SFA  . PONV (postoperative nausea and vomiting)    no porblem with the last 3 surgeries  . RBBB (right bundle branch block with left anterior fascicular block)    NUCLEAR STRESS TEST, 08/18/2010 - no significant wall motion abnoramlities noted, post-stress EF 69%, normal myocardial perfusion study  . Sinus tachycardia    a. Noted during admission 04/2014 but upon review seems to be frequent finding for patient.  . Stenosis of carotid artery    a. 50% right carotid stenosis by angiogram 04/2014.     FAMILY HISTORY:   Family History  Problem Relation Age of Onset  . Heart disease Mother   . Leukemia Mother   . Stomach cancer Father   . Esophageal cancer Brother   . Liver disease Brother   . Alcoholism Brother   . Leukemia Brother   . Leukemia Brother   . Diabetes type II Brother   . Lung disease Sister     SOCIAL HISTORY:   Social History   Tobacco Use  . Smoking status: Former Smoker    Years: 25.00    Quit date: 06/1988    Years since quitting: 30.7  . Smokeless tobacco: Never Used  Substance Use Topics  . Alcohol use: Yes    Comment: rarely     ALLERGIES:   Allergies  Allergen Reactions  . Asa [Aspirin] Other (See Comments)    GI bleeding  . Gadolinium Derivatives Itching, Swelling and Other (See Comments)    Pt complained of face flushing/hottness and throat tightness/scratchiness immediately after the injections.  Within 4 minutes, all symptoms were gone and the study was completed.  No further complications or signs of allergy were exhibited  after completion of study.   . Oxycodone-Acetaminophen Other (See Comments)    Dizziness and feeling of being uncomfortable      CURRENT MEDICATIONS:   Current Outpatient Medications  Medication Sig Dispense Refill  . clopidogrel (PLAVIX) 75 MG tablet TAKE 1 TABLET DAILY 90 tablet 3  . co-enzyme Q-10 30 MG capsule Take 30 mg by mouth 3 (three) times daily.    [name] lisinopril (ZESTRIL) 5 MG tablet Take 5 mg by mouth 2 (two) times daily.    . pantoprazole (PROTONIX) 40 MG tablet TAKE 1 TABLET TWICE A DAY (Patient taking differently: Take 40 mg by mouth 2 (two) times daily. ) 180  tablet 1  . rosuvastatin (CRESTOR) 20 MG tablet Take 20 mg by mouth every evening.     . TOPROL XL 25 MG 24 hr tablet      No current facility-administered medications for this visit.     REVIEW OF SYSTEMS:   [X]  denotes positive finding, [ ]  denotes negative finding Cardiac  Comments:  Chest pain or chest pressure:    Shortness of breath upon exertion:    Short of breath when lying flat:    Irregular heart rhythm:        Vascular    Pain in calf, thigh, or hip brought on by ambulation:    Pain in feet at night that wakes you up from your sleep:     Blood clot in your veins:    Leg swelling:         Pulmonary    Oxygen at home:    Productive cough:     Wheezing:         Neurologic    Sudden weakness in arms or legs:     Sudden numbness in arms or legs:     Sudden onset of difficulty speaking or slurred speech:    Temporary loss of vision in one eye:     Problems with dizziness:         Gastrointestinal    Blood in stool:     Vomited blood:         Genitourinary    Burning when urinating:     Blood in urine:        Psychiatric    Major depression:         Hematologic    Bleeding problems:    Problems with blood clotting too easily:        Skin    Rashes or ulcers:        Constitutional    Fever or chills:      PHYSICAL EXAM:   Vitals:   03/13/19 1103  BP: 129/66  Pulse: 86  Resp: 18  Temp: (!) 97.4 F (36.3 C)  SpO2: 99%  Weight: 136 lb (61.7 kg)  Height: 5\' 5"  (1.651 m)    GENERAL: The patient is a well-nourished male, in no acute distress. The vital signs are documented above. CARDIAC: There is a regular rate and rhythm.  VASCULAR: Palpable femoral pulses PULMONARY: Non-labored respirations ABDOMEN: Soft and non-tender.  Midline incision is well-healed without hernia MUSCULOSKELETAL: There are no major deformities or cyanosis. NEUROLOGIC: No focal weakness or paresthesias are detected. SKIN: There are no ulcers or rashes noted.  PSYCHIATRIC: The patient has a normal affect.  STUDIES:   I have reviewed his ultrasound with the following results: ABI/TBIToday's ABIToday's TBIPrevious ABIPrevious TBI +-------+-----------+-----------+------------+------------+ Right  0.77       0.43  0.93        0.70         +-------+-----------+-----------+------------+------------+ Left   0.62       0.36       0.70        0.36         +-------+-----------+-----------+------------+------------+   Right: Patent right femoral-popliteal bypass graft. Elevated velocities within the proximal bypass graft suggest a >70% stenosis.  Left: 50-74% stenosis noted at the deep femoral artery origin. Total occlusion noted in the proximal to mid superficial femoral artery reconstituted distally via collaterals. MEDICAL ISSUES:   Carotid: Currently being followed by Dr. Gwenlyn Found  Claudication: The patient still has limitations with his walking however this is significantly improved.  There is also narrowing within the bypass graft on the right however I am not recommending intervention at this time.  His symptoms remain stable.  I have him scheduled for follow-up in 6 months    Leia Alf, MD, FACS [facility] [phone] Pager [phone]

## 2019-03-14 DIAGNOSIS — H353113 Nonexudative age-related macular degeneration, right eye, advanced atrophic without subfoveal involvement: Secondary | ICD-10-CM | POA: Diagnosis not present

## 2019-03-14 DIAGNOSIS — H353222 Exudative age-related macular degeneration, left eye, with inactive choroidal neovascularization: Secondary | ICD-10-CM | POA: Diagnosis not present

## 2019-03-14 DIAGNOSIS — H43811 Vitreous degeneration, right eye: Secondary | ICD-10-CM | POA: Diagnosis not present

## 2019-03-14 DIAGNOSIS — H353212 Exudative age-related macular degeneration, right eye, with inactive choroidal neovascularization: Secondary | ICD-10-CM | POA: Diagnosis not present

## 2019-03-14 DIAGNOSIS — H353124 Nonexudative age-related macular degeneration, left eye, advanced atrophic with subfoveal involvement: Secondary | ICD-10-CM | POA: Diagnosis not present

## 2019-03-24 DIAGNOSIS — Z23 Encounter for immunization: Secondary | ICD-10-CM | POA: Diagnosis not present

## 2019-04-05 ENCOUNTER — Telehealth: Payer: Self-pay

## 2019-04-05 NOTE — Telephone Encounter (Signed)
Disability placard form signed by Dr. Gwenlyn Found mailed to pt address on  File on 04/05/2019

## 2019-04-07 DIAGNOSIS — E1351 Other specified diabetes mellitus with diabetic peripheral angiopathy without gangrene: Secondary | ICD-10-CM | POA: Diagnosis not present

## 2019-04-07 DIAGNOSIS — L602 Onychogryphosis: Secondary | ICD-10-CM | POA: Diagnosis not present

## 2019-04-28 DIAGNOSIS — Z125 Encounter for screening for malignant neoplasm of prostate: Secondary | ICD-10-CM | POA: Diagnosis not present

## 2019-04-28 DIAGNOSIS — E7849 Other hyperlipidemia: Secondary | ICD-10-CM | POA: Diagnosis not present

## 2019-04-28 DIAGNOSIS — D649 Anemia, unspecified: Secondary | ICD-10-CM | POA: Diagnosis not present

## 2019-04-28 DIAGNOSIS — E1151 Type 2 diabetes mellitus with diabetic peripheral angiopathy without gangrene: Secondary | ICD-10-CM | POA: Diagnosis not present

## 2019-05-01 ENCOUNTER — Encounter: Payer: Self-pay | Admitting: Cardiovascular Disease

## 2019-05-01 DIAGNOSIS — E1151 Type 2 diabetes mellitus with diabetic peripheral angiopathy without gangrene: Secondary | ICD-10-CM | POA: Diagnosis not present

## 2019-05-01 DIAGNOSIS — I2510 Atherosclerotic heart disease of native coronary artery without angina pectoris: Secondary | ICD-10-CM | POA: Diagnosis not present

## 2019-05-01 DIAGNOSIS — K819 Cholecystitis, unspecified: Secondary | ICD-10-CM | POA: Diagnosis not present

## 2019-05-01 DIAGNOSIS — M4810 Ankylosing hyperostosis [Forestier], site unspecified: Secondary | ICD-10-CM | POA: Diagnosis not present

## 2019-05-01 DIAGNOSIS — I739 Peripheral vascular disease, unspecified: Secondary | ICD-10-CM | POA: Diagnosis not present

## 2019-05-01 DIAGNOSIS — Z0000 Encounter for general adult medical examination without abnormal findings: Secondary | ICD-10-CM | POA: Diagnosis not present

## 2019-05-01 DIAGNOSIS — Z1331 Encounter for screening for depression: Secondary | ICD-10-CM | POA: Diagnosis not present

## 2019-05-01 DIAGNOSIS — I129 Hypertensive chronic kidney disease with stage 1 through stage 4 chronic kidney disease, or unspecified chronic kidney disease: Secondary | ICD-10-CM | POA: Diagnosis not present

## 2019-05-01 DIAGNOSIS — R634 Abnormal weight loss: Secondary | ICD-10-CM | POA: Diagnosis not present

## 2019-05-01 DIAGNOSIS — I10 Essential (primary) hypertension: Secondary | ICD-10-CM | POA: Diagnosis not present

## 2019-05-01 DIAGNOSIS — D62 Acute posthemorrhagic anemia: Secondary | ICD-10-CM | POA: Diagnosis not present

## 2019-05-01 DIAGNOSIS — R82998 Other abnormal findings in urine: Secondary | ICD-10-CM | POA: Diagnosis not present

## 2019-05-01 DIAGNOSIS — M1990 Unspecified osteoarthritis, unspecified site: Secondary | ICD-10-CM | POA: Diagnosis not present

## 2019-05-01 DIAGNOSIS — N1831 Chronic kidney disease, stage 3a: Secondary | ICD-10-CM | POA: Diagnosis not present

## 2019-05-17 DIAGNOSIS — Z1212 Encounter for screening for malignant neoplasm of rectum: Secondary | ICD-10-CM | POA: Diagnosis not present

## 2019-06-02 ENCOUNTER — Other Ambulatory Visit (HOSPITAL_COMMUNITY): Payer: Self-pay | Admitting: Cardiovascular Disease

## 2019-06-02 ENCOUNTER — Ambulatory Visit (HOSPITAL_COMMUNITY)
Admission: RE | Admit: 2019-06-02 | Discharge: 2019-06-02 | Disposition: A | Discharge status: Home / Self Care | Payer: Medicare Other | Source: Ambulatory Visit | Attending: Cardiovascular Disease | Admitting: Cardiovascular Disease

## 2019-06-02 ENCOUNTER — Other Ambulatory Visit: Payer: Self-pay

## 2019-06-02 DIAGNOSIS — I6529 Occlusion and stenosis of unspecified carotid artery: Secondary | ICD-10-CM

## 2019-06-02 DIAGNOSIS — I739 Peripheral vascular disease, unspecified: Secondary | ICD-10-CM

## 2019-06-02 DIAGNOSIS — I6523 Occlusion and stenosis of bilateral carotid arteries: Secondary | ICD-10-CM

## 2019-07-05 DIAGNOSIS — M6281 Muscle weakness (generalized): Secondary | ICD-10-CM | POA: Diagnosis not present

## 2019-07-12 DIAGNOSIS — M6281 Muscle weakness (generalized): Secondary | ICD-10-CM | POA: Diagnosis not present

## 2019-07-19 DIAGNOSIS — M6281 Muscle weakness (generalized): Secondary | ICD-10-CM | POA: Diagnosis not present

## 2019-07-20 ENCOUNTER — Other Ambulatory Visit: Payer: Self-pay | Admitting: Cardiovascular Disease

## 2019-08-02 DIAGNOSIS — M6281 Muscle weakness (generalized): Secondary | ICD-10-CM | POA: Diagnosis not present

## 2019-08-09 DIAGNOSIS — M6281 Muscle weakness (generalized): Secondary | ICD-10-CM | POA: Diagnosis not present

## 2019-09-06 DIAGNOSIS — I739 Peripheral vascular disease, unspecified: Secondary | ICD-10-CM | POA: Diagnosis not present

## 2019-09-06 DIAGNOSIS — L602 Onychogryphosis: Secondary | ICD-10-CM | POA: Diagnosis not present

## 2019-09-21 DIAGNOSIS — H353212 Exudative age-related macular degeneration, right eye, with inactive choroidal neovascularization: Secondary | ICD-10-CM | POA: Diagnosis not present

## 2019-09-21 DIAGNOSIS — H353222 Exudative age-related macular degeneration, left eye, with inactive choroidal neovascularization: Secondary | ICD-10-CM | POA: Diagnosis not present

## 2019-09-21 DIAGNOSIS — H353113 Nonexudative age-related macular degeneration, right eye, advanced atrophic without subfoveal involvement: Secondary | ICD-10-CM | POA: Diagnosis not present

## 2019-09-21 DIAGNOSIS — H353124 Nonexudative age-related macular degeneration, left eye, advanced atrophic with subfoveal involvement: Secondary | ICD-10-CM | POA: Diagnosis not present

## 2019-09-25 DIAGNOSIS — E1151 Type 2 diabetes mellitus with diabetic peripheral angiopathy without gangrene: Secondary | ICD-10-CM | POA: Diagnosis not present

## 2019-09-25 DIAGNOSIS — L0889 Other specified local infections of the skin and subcutaneous tissue: Secondary | ICD-10-CM | POA: Diagnosis not present

## 2019-09-25 DIAGNOSIS — D62 Acute posthemorrhagic anemia: Secondary | ICD-10-CM | POA: Diagnosis not present

## 2019-09-25 DIAGNOSIS — R413 Other amnesia: Secondary | ICD-10-CM | POA: Diagnosis not present

## 2019-09-25 DIAGNOSIS — N1831 Chronic kidney disease, stage 3a: Secondary | ICD-10-CM | POA: Diagnosis not present

## 2019-09-25 DIAGNOSIS — I739 Peripheral vascular disease, unspecified: Secondary | ICD-10-CM | POA: Diagnosis not present

## 2019-09-25 DIAGNOSIS — Z1331 Encounter for screening for depression: Secondary | ICD-10-CM | POA: Diagnosis not present

## 2019-09-25 DIAGNOSIS — I2510 Atherosclerotic heart disease of native coronary artery without angina pectoris: Secondary | ICD-10-CM | POA: Diagnosis not present

## 2019-09-25 DIAGNOSIS — E7849 Other hyperlipidemia: Secondary | ICD-10-CM | POA: Diagnosis not present

## 2019-09-25 DIAGNOSIS — I129 Hypertensive chronic kidney disease with stage 1 through stage 4 chronic kidney disease, or unspecified chronic kidney disease: Secondary | ICD-10-CM | POA: Diagnosis not present

## 2019-09-28 ENCOUNTER — Other Ambulatory Visit: Payer: Self-pay | Admitting: *Deleted

## 2019-09-28 ENCOUNTER — Telehealth (HOSPITAL_COMMUNITY): Payer: Self-pay

## 2019-09-28 DIAGNOSIS — I739 Peripheral vascular disease, unspecified: Secondary | ICD-10-CM

## 2019-09-28 NOTE — Telephone Encounter (Signed)

## 2019-10-02 ENCOUNTER — Ambulatory Visit (HOSPITAL_COMMUNITY)
Admission: RE | Admit: 2019-10-02 | Discharge: 2019-10-02 | Disposition: A | Discharge status: Home / Self Care | Payer: Medicare Other | Source: Ambulatory Visit | Attending: Surgery | Admitting: Surgery

## 2019-10-02 ENCOUNTER — Ambulatory Visit (INDEPENDENT_AMBULATORY_CARE_PROVIDER_SITE_OTHER)
Admission: RE | Admit: 2019-10-02 | Discharge: 2019-10-02 | Disposition: A | Discharge status: Home / Self Care | Payer: Medicare Other | Source: Ambulatory Visit | Attending: Surgery | Admitting: Surgery

## 2019-10-02 ENCOUNTER — Ambulatory Visit (INDEPENDENT_AMBULATORY_CARE_PROVIDER_SITE_OTHER): Payer: Medicare Other | Admitting: Surgery

## 2019-10-02 ENCOUNTER — Other Ambulatory Visit: Payer: Self-pay

## 2019-10-02 ENCOUNTER — Encounter: Payer: Self-pay | Admitting: Surgery

## 2019-10-02 VITALS — BP 120/59 | HR 87 | Temp 97.5°F | Resp 16 | Ht 65.0 in | Wt 152.0 lb

## 2019-10-02 DIAGNOSIS — I70211 Atherosclerosis of native arteries of extremities with intermittent claudication, right leg: Secondary | ICD-10-CM | POA: Diagnosis not present

## 2019-10-02 DIAGNOSIS — I739 Peripheral vascular disease, unspecified: Secondary | ICD-10-CM | POA: Diagnosis not present

## 2019-10-02 NOTE — Progress Notes (Signed)
[facility] and [facility]in Specialist of [place]  Patient name: Christian Bailey MRN: [id number] DOB: 09/19/1940 Sex: male   REASON FOR VISIT:    Follow up  HISOTRY OF PRESENT ILLNESS:    Christian Bailey a 79 y.o.malewho returns today for follow-up. He initially underwent right femoral-popliteal bypass graft with vein on 09/18/2016 for claudication. He has undergone percutaneous as well as surgical revisions of his proximal bypass. He still is having challenges with claudication. Ultimately I felt his inflow was the issue and so on 07/06/2018 he underwent an aortobifemoral bypass graft. Also revised the proximal portion of his right femoropopliteal bypass with an interposition Gore-Tex. He had a prolonged hospital course, lasting several weeks which ultimately was related to acute cholecystitis. He had a percutaneous cholecystostomy tube placed. He is back today for follow-up.  He has really not noticed any changes in his legs.  He tries to walk as much as he can but has difficulty after about 150-200 steps.  He does not have any open wounds.  He continues to take a statin for hypercholesterolemia.  He takes Plavix as his antiplatelet therapy as he is allergic to aspirin.  He is a non-smoker but former smoker.  PAST MEDICAL HISTORY:   Past Medical History:  Diagnosis Date  . Anemia   . Complication of anesthesia   . Diabetes mellitus ([place])    TYPE 2  . GERD (gastroesophageal reflux disease)   . History of blood transfusion    GI bleed  . History of colon polyps   . History of hiatal hernia   . Hyperlipemia   . Hypertension   . Osteoarthritis   . PAD (peripheral artery disease) (HCC)    a. stenting of his left common iliac artery >20 years ago. b. h/o LEIA stent and 2 stents to R SFA in 2011. c. 04/2014:  s/p PTA of right SFA for in-stent restenosis, occluded left SFA  . PONV (postoperative nausea and vomiting)    no porblem with the last 3  surgeries  . RBBB (right bundle branch block with left anterior fascicular block)    NUCLEAR STRESS TEST, 08/18/2010 - no significant wall motion abnoramlities noted, post-stress EF 69%, normal myocardial perfusion study  . Sinus tachycardia    a. Noted during admission 04/2014 but upon review seems to be frequent finding for patient.  . Stenosis of carotid artery    a. 50% right carotid stenosis by angiogram 04/2014.     FAMILY HISTORY:   Family History  Problem Relation Age of Onset  . Heart disease Mother   . Leukemia Mother   . Stomach cancer Father   . Esophageal cancer Brother   . Liver disease Brother   . Alcoholism Brother   . Leukemia Brother   . Leukemia Brother   . Diabetes type II Brother   . Lung disease Sister     SOCIAL HISTORY:   Social History   Tobacco Use  . Smoking status: Former Smoker    Years: 25.00    Quit date: 06/1988    Years since quitting: 31.2  . Smokeless tobacco: Never Used  Substance Use Topics  . Alcohol use: Yes    Comment: rarely     ALLERGIES:   Allergies  Allergen Reactions  . Asa [Aspirin] Other (See Comments)    GI bleeding  . Gadolinium Derivatives Itching, Swelling and Other (See Comments)    Pt complained of face flushing/hottness and throat tightness/scratchiness immediately after the injections.  Within 4  minutes, all symptoms were gone and the study was completed.  No further complications or signs of allergy were exhibited after completion of study.   . Oxycodone-Acetaminophen Other (See Comments)    Dizziness and feeling of being uncomfortable      CURRENT MEDICATIONS:   Current Outpatient Medications  Medication Sig Dispense Refill  . clopidogrel (PLAVIX) 75 MG tablet TAKE 1 TABLET DAILY 90 tablet 3  . co-enzyme Q-10 30 MG capsule Take 30 mg by mouth 3 (three) times daily.    [name] lisinopril (ZESTRIL) 5 MG tablet Take 5 mg by mouth 2 (two) times daily.    . pantoprazole (PROTONIX) 40 MG tablet TAKE 1 TABLET  TWICE A DAY 180 tablet 3  . rosuvastatin (CRESTOR) 20 MG tablet Take 20 mg by mouth every evening.     . TOPROL XL 25 MG 24 hr tablet      No current facility-administered medications for this visit.    REVIEW OF SYSTEMS:   [X]  denotes positive finding, [ ]  denotes negative finding Cardiac  Comments:  Chest pain or chest pressure:    Shortness of breath upon exertion:    Short of breath when lying flat:    Irregular heart rhythm:        Vascular    Pain in calf, thigh, or hip brought on by ambulation: x   Pain in feet at night that wakes you up from your sleep:     Blood clot in your veins:    Leg swelling:         Pulmonary    Oxygen at home:    Productive cough:     Wheezing:         Neurologic    Sudden weakness in arms or legs:     Sudden numbness in arms or legs:     Sudden onset of difficulty speaking or slurred speech:    Temporary loss of vision in one eye:     Problems with dizziness:         Gastrointestinal    Blood in stool:     Vomited blood:         Genitourinary    Burning when urinating:     Blood in urine:        Psychiatric    Major depression:         Hematologic    Bleeding problems:    Problems with blood clotting too easily:        Skin    Rashes or ulcers:        Constitutional    Fever or chills:      PHYSICAL EXAM:   There were no vitals filed for this visit.  GENERAL: The patient is a well-nourished male, in no acute distress. The vital signs are documented above. CARDIAC: There is a regular rate and rhythm.  VASCULAR: Nonpalpable pedal pulses PULMONARY: Non-labored respirations ABDOMEN: Soft and non-tender.  No incisional hernia MUSCULOSKELETAL: There are no major deformities or cyanosis. NEUROLOGIC: No focal weakness or paresthesias are detected. SKIN: There are no ulcers or rashes noted. PSYCHIATRIC: The patient has a normal affect.  STUDIES:   I have reviewed the following  LE DUPLEX: :Right: Occluded proximal  portion of the femoropopliteal bypass graft with  minimal retrograde and to-and-fro flow observed in the mid and distal  segments of the graft.   +-------+-----------+-----------+------------+------------+  ABI/TBIToday's ABIToday's TBIPrevious ABIPrevious TBI  +-------+-----------+-----------+------------+------------+  Right 0.36    0.40  0.77    0.43      +-------+-----------+-----------+------------+------------+  Left  0.54    0.49    0.62    0.36      +-------+-----------+-----------+------------+------------+   MEDICAL ISSUES:   We discussed interval occlusion of his right femoral-popliteal bypass graft which has been essentially been asymptomatic.  No intervention is recommended.  We discussed the importance of monitoring his feet for nonhealing wounds and to contact me should these occur.  Otherwise I have him scheduled to come back to see me in 1 year with ABIs.    Leia Alf, MD, FACS [facility] and Vein Specialists of [facility] [phone] Pager [phone]

## 2019-10-04 ENCOUNTER — Other Ambulatory Visit: Payer: Self-pay | Admitting: *Deleted

## 2019-10-04 DIAGNOSIS — I739 Peripheral vascular disease, unspecified: Secondary | ICD-10-CM

## 2019-11-20 DIAGNOSIS — L602 Onychogryphosis: Secondary | ICD-10-CM | POA: Diagnosis not present

## 2019-11-20 DIAGNOSIS — E1351 Other specified diabetes mellitus with diabetic peripheral angiopathy without gangrene: Secondary | ICD-10-CM | POA: Diagnosis not present

## 2020-02-01 DIAGNOSIS — I739 Peripheral vascular disease, unspecified: Secondary | ICD-10-CM | POA: Diagnosis not present

## 2020-02-01 DIAGNOSIS — E1151 Type 2 diabetes mellitus with diabetic peripheral angiopathy without gangrene: Secondary | ICD-10-CM | POA: Diagnosis not present

## 2020-02-01 DIAGNOSIS — I2510 Atherosclerotic heart disease of native coronary artery without angina pectoris: Secondary | ICD-10-CM | POA: Diagnosis not present

## 2020-02-01 DIAGNOSIS — I129 Hypertensive chronic kidney disease with stage 1 through stage 4 chronic kidney disease, or unspecified chronic kidney disease: Secondary | ICD-10-CM | POA: Diagnosis not present

## 2020-02-01 DIAGNOSIS — N1831 Chronic kidney disease, stage 3a: Secondary | ICD-10-CM | POA: Diagnosis not present

## 2020-02-01 DIAGNOSIS — E785 Hyperlipidemia, unspecified: Secondary | ICD-10-CM | POA: Diagnosis not present

## 2020-02-01 DIAGNOSIS — K5730 Diverticulosis of large intestine without perforation or abscess without bleeding: Secondary | ICD-10-CM | POA: Diagnosis not present

## 2020-02-01 DIAGNOSIS — D62 Acute posthemorrhagic anemia: Secondary | ICD-10-CM | POA: Diagnosis not present

## 2020-02-05 DIAGNOSIS — L602 Onychogryphosis: Secondary | ICD-10-CM | POA: Diagnosis not present

## 2020-02-05 DIAGNOSIS — E1351 Other specified diabetes mellitus with diabetic peripheral angiopathy without gangrene: Secondary | ICD-10-CM | POA: Diagnosis not present

## 2020-02-23 ENCOUNTER — Ambulatory Visit (INDEPENDENT_AMBULATORY_CARE_PROVIDER_SITE_OTHER): Payer: Medicare Other | Admitting: Cardiovascular Disease

## 2020-02-23 ENCOUNTER — Other Ambulatory Visit: Payer: Self-pay

## 2020-02-23 ENCOUNTER — Encounter: Payer: Self-pay | Admitting: Cardiovascular Disease

## 2020-02-23 VITALS — BP 106/44 | HR 88 | Ht 65.0 in | Wt 150.0 lb

## 2020-02-23 DIAGNOSIS — I739 Peripheral vascular disease, unspecified: Secondary | ICD-10-CM

## 2020-02-23 DIAGNOSIS — I6521 Occlusion and stenosis of right carotid artery: Secondary | ICD-10-CM | POA: Diagnosis not present

## 2020-02-23 DIAGNOSIS — E782 Mixed hyperlipidemia: Secondary | ICD-10-CM | POA: Diagnosis not present

## 2020-02-23 DIAGNOSIS — I10 Essential (primary) hypertension: Secondary | ICD-10-CM | POA: Diagnosis not present

## 2020-02-23 NOTE — Assessment & Plan Note (Signed)
History of essential hypertension blood pressure measured today 106/44.  He is on lisinopril and Toprol.[name]

## 2020-02-23 NOTE — Progress Notes (Signed)
02/23/2020 [name]   [birth year]  [id number]  Primary Physician Prince Solian, MD Primary Cardiologist: Lorretta Harp MD [name], [place]  HPI:  Yehonatan Grandison is a 79 y.o.   married Caucasian male, father of 2, grandfather to 1 grandchild who I last saw 01/31/2019.[name] [name] KitchenHis daughter-in-law,Dr. Caffie Pinto a primary care physician at [facility]. He has a history of PVOD and stenting of his left common iliac artery 20 years ago. He was smoking 3 packs a day at that time but stopped smoking 20 years ago.   His other problems include hypertension, hyperlipidemia and noninsulin-requiring diabetes. He had a negative Myoview in our office August 18, 2010. Because of claudication, I performed angiograph on him May 12, 2010, revealing an intact left common iliac artery stent, high-grade left external iliac artery stenosis which I stented using a 7 mm x 3 cm long Nitinol self-expanding stent. I also put 2 stents in his right SFA. Followup Dopplers showed improvement but symptomatically he did not really improve much. He still complains of predictable bilateral lower extremity claudication at several hundred yards. His Dopplers have remained stable. Dr. Dagmar Hait follows his lipid profile which was recently done earlier this month revealing a total cholesterol of 153, LDL of 86 and HDL of 34. Recent lower with Doppler studies performed in October 2014 revealed patent stents with a right ABI of 0.81 and a left of 0.67.over the past year he's noticed progressive but without limiting claudication with recent Dopplers performed 04/04/14 revealing a decline in his right ABI from 0.81-0.53 with progression of disease in his right SFA suggesting "in-stent restenosis.I also performed carotid Doppler studies that suggested moderately severe right internal carotid artery stenosis.   As a result of these studies I performed angiography on him 05/14/14 revealing a patent left external iliac artery stent  with at most 30-40% "in-stent restenosis, and known occluded left SFA with two-vessel runoff (occluded posterior tibial), patent proximal right SFA stent with diffuse "95% in-stent restenosis" within the distal right SFA stent. He had three-vessel runoff on the right. I performed a drug eluting balloon angioplasty for the in-stent restenosis with excellent angiographic and Doppler result although his symptoms did not significantly improve. I also angiogram his carotids because of a moderately severe right ICA stenosis by duplex ultrasound revealing a 50% hypodense right internal carotid artery stenosis with a bovine arch. We have continued to follow these noninvasively. He is neurologically asymptomatic. He denies chest pain or shortness of breath. Since I saw him a year [name] has noticed increasing claudication bilaterally. We obtained lower extremity Dopplers 12/09/15 revealing a decline in his right ABI from 0.85 down to 0.68 and occluded distal right SFA. We did begin him on Pletal which she has taken for last several months with minimal benefit.I angiogram 1010/23/17 revealing a widely patent left iliac stent, occluded left SFA with two-vessel runoff and a occluded right SFA with two-vessel runoff. I did not think he had percutaneous options for revascularization and hetherefore underwent right femoropopliteal bypass grafting by Dr. Kimberlee Nearing of this year. He's been reinjured them twice demonstrating a patent graft despite the fact that his symptoms have gotten worse. He is also had a reverse right shoulder replacement by Dr.Norrisin June of this 2019.  He underwent aortobifemoral bypass grafting by [name] but has had minimal benefit with regards to claudication.  He has had a PERC drain put in his gallbladder which has had for the last 8 months and recent mission for  a gastrointestinal infectious illness.  He denies chest pain or shortness of breath.  He is scheduled for potential  cholecystectomy in the upcoming future which she will be cleared for at low risk given his recent low risk Myoview in December and normal 2D echocardiogram.  Since I saw him a year ago he is remained cardiovascular stable.  He denies chest pain, shortness of breath or claudication.   Current Meds  Medication Sig  . clopidogrel (PLAVIX) 75 MG tablet TAKE 1 TABLET DAILY  . lisinopril (ZESTRIL) 5 MG tablet Take 5 mg by mouth 2 (two) times daily.  . metFORMIN (GLUCOPHAGE) 1000 MG tablet Take 1,000 mg by mouth 2 (two) times daily.  . pantoprazole (PROTONIX) 40 MG tablet TAKE 1 TABLET TWICE A DAY  . rosuvastatin (CRESTOR) 20 MG tablet Take 20 mg by mouth every evening.   . TOPROL XL 25 MG 24 hr tablet      Allergies  Allergen Reactions  . Asa [Aspirin] Other (See Comments)    GI bleeding  . Gadolinium Derivatives Itching, Swelling and Other (See Comments)    Pt complained of face flushing/hottness and throat tightness/scratchiness immediately after the injections.  Within 4 minutes, all symptoms were gone and the study was completed.  No further complications or signs of allergy were exhibited after completion of study.   . Oxycodone-Acetaminophen Other (See Comments)    Dizziness and feeling of being uncomfortable     Social History   Socioeconomic History  . Marital status: Married    Spouse name: Not on file  . Number of children: 2  . Years of education: Not on file  . Highest education level: Not on file  Occupational History  . Occupation: retired    Fish farm manager: RETIRED  Tobacco Use  . Smoking status: Former Smoker    Years: 25.00    Quit date: 06/1988    Years since quitting: 31.6  . Smokeless tobacco: Never Used  Vaping Use  . Vaping Use: Never used  Substance and Sexual Activity  . Alcohol use: Yes    Comment: rarely  . Drug use: No  . Sexual activity: Not on file  Other Topics Concern  . Not on file  Social History Narrative  . Not on file   Social Determinants  of Health   Financial Resource Strain:   . Difficulty of Paying Living Expenses: Not on file  Food Insecurity:   . Worried About Charity fundraiser in the Last Year: Not on file  . Ran Out of Food in the Last Year: Not on file  Transportation Needs:   . Lack of Transportation (Medical): Not on file  . Lack of Transportation (Non-Medical): Not on file  Physical Activity:   . Days of Exercise per Week: Not on file  . Minutes of Exercise per Session: Not on file  Stress:   . Feeling of Stress : Not on file  Social Connections:   . Frequency of Communication with Friends and Family: Not on file  . Frequency of Social Gatherings with Friends and Family: Not on file  . Attends Religious Services: Not on file  . Active Member of Clubs or Organizations: Not on file  . Attends Archivist Meetings: Not on file  . Marital Status: Not on file  Intimate Partner Violence:   . Fear of Current or Ex-Partner: Not on file  . Emotionally Abused: Not on file  . Physically Abused: Not on file  . Sexually Abused:  Not on file     Review of Systems: General: negative for chills, fever, night sweats or weight changes.  Cardiovascular: negative for chest pain, dyspnea on exertion, edema, orthopnea, palpitations, paroxysmal nocturnal dyspnea or shortness of breath Dermatological: negative for rash Respiratory: negative for cough or wheezing Urologic: negative for hematuria Abdominal: negative for nausea, vomiting, diarrhea, bright red blood per rectum, melena, or hematemesis Neurologic: negative for visual changes, syncope, or dizziness All other systems reviewed and are otherwise negative except as noted above.    Blood pressure (!) 106/44, pulse 88, height 5\' 5"  (1.651 m), weight 150 lb (68 kg), SpO2 99 %.    EKG sinus rhythm at 88 with right bundle branch block and left axis deviation.  I personally reviewed this EKG.  ASSESSMENT AND PLAN:   Essential hypertension History of  essential hypertension blood pressure measured today 106/44.  He is on lisinopril and Toprol.[name]  PAD (peripheral artery disease) (HCC) PAD s/p left external iliac artery stenting as well as right SFA stenting by myself remotely.  His last angiogram was performed 04/20/2016.  He did have he did undergo right femoropopliteal bypass grafting by [name] 09/18/2016 with revision of inflow and ultimately underwent aortobifemoral bypass grafting without improvement in his claudication symptoms.  Hyperlipidemia History of hyperlipidemia on statin therapy with lipid profile performed 04/28/2019 revealing total cluster 125, LDL 49 and HDL 31.  Carotid artery disease History of moderate right ICA stenosis by recent duplex ultrasound performed 06/02/2019.  This will be repeated this coming December.      Lorretta Harp MD FACP,FACC,FAHA, [facility] 02/23/2020 10:35 AM

## 2020-02-23 NOTE — Patient Instructions (Signed)
Medication Instructions:  Your physician recommends that you continue on your current medications as directed. Please refer to the Current Medication list given to you today.  *If you need a refill on your cardiac medications before your next appointment, please call your pharmacy*  Testing/Procedures: Your physician has requested that you have a carotid duplex in [place]. This test is an ultrasound of the carotid arteries in your neck. It looks at blood flow through these arteries that supply the brain with blood. Allow one hour for this exam. There are no restrictions or special instructions.  Follow-Up: At [facility], you and your health needs are our priority.  As part of our continuing mission to provide you with exceptional heart care, we have created designated Provider Care Teams.  These Care Teams include your primary Cardiologist (physician) and Advanced Practice Providers (APPs -  Physician Assistants and Nurse Practitioners) who all work together to provide you with the care you need, when you need it.  We recommend signing up for the patient portal called "MyChart".  Sign up information is provided on this After Visit Summary.  MyChart is used to connect with patients for Virtual Visits (Telemedicine).  Patients are able to view lab/test results, encounter notes, upcoming appointments, etc.  Non-urgent messages can be sent to your provider as well.   To learn more about what you can do with MyChart, go to [email or web address].    Your next appointment:   12 month(s)  The format for your next appointment:   In Person  Provider:   Quay Burow, MD

## 2020-02-23 NOTE — Assessment & Plan Note (Signed)
PAD s/p left external iliac artery stenting as well as right SFA stenting by myself remotely.  His last angiogram was performed 04/20/2016.  He did have he did undergo right femoropopliteal bypass grafting by [name] 09/18/2016 with revision of inflow and ultimately underwent aortobifemoral bypass grafting without improvement in his claudication symptoms.

## 2020-02-23 NOTE — Assessment & Plan Note (Signed)
History of hyperlipidemia on statin therapy with lipid profile performed 04/28/2019 revealing total cluster 125, LDL 49 and HDL 31.

## 2020-02-23 NOTE — Assessment & Plan Note (Signed)
History of moderate right ICA stenosis by recent duplex ultrasound performed 06/02/2019.  This will be repeated this coming December.

## 2020-03-25 ENCOUNTER — Encounter (INDEPENDENT_AMBULATORY_CARE_PROVIDER_SITE_OTHER): Payer: Self-pay | Admitting: Ophthalmology

## 2020-03-25 ENCOUNTER — Ambulatory Visit (INDEPENDENT_AMBULATORY_CARE_PROVIDER_SITE_OTHER): Payer: Medicare Other | Admitting: Ophthalmology

## 2020-03-25 ENCOUNTER — Encounter (INDEPENDENT_AMBULATORY_CARE_PROVIDER_SITE_OTHER): Payer: Medicare Other | Admitting: Ophthalmology

## 2020-03-25 ENCOUNTER — Other Ambulatory Visit: Payer: Self-pay

## 2020-03-25 DIAGNOSIS — H353222 Exudative age-related macular degeneration, left eye, with inactive choroidal neovascularization: Secondary | ICD-10-CM | POA: Insufficient documentation

## 2020-03-25 DIAGNOSIS — H353114 Nonexudative age-related macular degeneration, right eye, advanced atrophic with subfoveal involvement: Secondary | ICD-10-CM

## 2020-03-25 DIAGNOSIS — H353212 Exudative age-related macular degeneration, right eye, with inactive choroidal neovascularization: Secondary | ICD-10-CM | POA: Diagnosis not present

## 2020-03-25 DIAGNOSIS — H353124 Nonexudative age-related macular degeneration, left eye, advanced atrophic with subfoveal involvement: Secondary | ICD-10-CM | POA: Diagnosis not present

## 2020-03-25 NOTE — Patient Instructions (Signed)
Patient asked to report if new onset visual acuity declines or difficulties

## 2020-03-25 NOTE — Assessment & Plan Note (Signed)
The nature of dry age related macular degeneration was discussed with the patient as well as its possible conversion to wet. The results of the AREDS 2 study was discussed with the patient. A diet rich in dark leafy green vegetables was advised and specific recommendations were made regarding supplements with AREDS 2 formulation . Control of hypertension and serum cholesterol may slow the disease. Smoking cessation is mandatory to slow the disease and diminish the risk of progressing to wet age related macular degeneration. The patient was instructed in the use of an [place] and was told to return immediately for any changes in the Grid. Stressed to the patient do not rub eyes  The accounts for acuity will observe

## 2020-03-25 NOTE — Assessment & Plan Note (Signed)
No active CNVM OU at this point

## 2020-03-25 NOTE — Progress Notes (Signed)
03/25/2020     CHIEF COMPLAINT Patient presents for Retina Follow Up   HISTORY OF PRESENT ILLNESS: Christian Bailey is a 79 y.o. male who presents to the clinic today for:   HPI    Retina Follow Up    Patient presents with  Dry AMD.  In both eyes.  Severity is moderate.  Duration of 6 months.  Since onset it is stable.  I, the attending physician,  performed the HPI with the patient and updated documentation appropriately.          Comments    6 Month Dry AMD f\u OU. OCT  Pt states OU vision is either the same or slightly worse. Pt denies FOL and floaters.       Last edited by Tilda Franco on 03/25/2020  1:34 PM. (History)      Referring physician: Prince Solian, MD [place],  [place] [address]  HISTORICAL INFORMATION:   Selected notes from the MEDICAL RECORD NUMBER    Lab Results  Component Value Date   HGBA1C 5.9 (H) 01/20/2019     CURRENT MEDICATIONS: No current outpatient medications on file. (Ophthalmic Drugs)   No current facility-administered medications for this visit. (Ophthalmic Drugs)   Current Outpatient Medications (Other)  Medication Sig  . clopidogrel (PLAVIX) 75 MG tablet TAKE 1 TABLET DAILY  . lisinopril (ZESTRIL) 5 MG tablet Take 5 mg by mouth 2 (two) times daily.  . metFORMIN (GLUCOPHAGE) 1000 MG tablet Take 1,000 mg by mouth 2 (two) times daily.  . pantoprazole (PROTONIX) 40 MG tablet TAKE 1 TABLET TWICE A DAY  . rosuvastatin (CRESTOR) 20 MG tablet Take 20 mg by mouth every evening.   . TOPROL XL 25 MG 24 hr tablet    No current facility-administered medications for this visit. (Other)      REVIEW OF SYSTEMS:    ALLERGIES Allergies  Allergen Reactions  . Asa [Aspirin] Other (See Comments)    GI bleeding  . Gadolinium Derivatives Itching, Swelling and Other (See Comments)    Pt complained of face flushing/hottness and throat tightness/scratchiness immediately after the injections.  Within 4 minutes, all symptoms  were gone and the study was completed.  No further complications or signs of allergy were exhibited after completion of study.   . Oxycodone-Acetaminophen Other (See Comments)    Dizziness and feeling of being uncomfortable     PAST MEDICAL HISTORY Past Medical History:  Diagnosis Date  . Anemia   . Complication of anesthesia   . Diabetes mellitus ([place])    TYPE 2  . GERD (gastroesophageal reflux disease)   . History of blood transfusion    GI bleed  . History of colon polyps   . History of hiatal hernia   . Hyperlipemia   . Hypertension   . Osteoarthritis   . PAD (peripheral artery disease) (HCC)    a. stenting of his left common iliac artery >20 years ago. b. h/o LEIA stent and 2 stents to R SFA in 2011. c. 04/2014:  s/p PTA of right SFA for in-stent restenosis, occluded left SFA  . PONV (postoperative nausea and vomiting)    no porblem with the last 3 surgeries  . RBBB (right bundle branch block with left anterior fascicular block)    NUCLEAR STRESS TEST, 08/18/2010 - no significant wall motion abnoramlities noted, post-stress EF 69%, normal myocardial perfusion study  . Sinus tachycardia    a. Noted during admission 04/2014 but upon review seems to  be frequent finding for patient.  . Stenosis of carotid artery    a. 50% right carotid stenosis by angiogram 04/2014.   Past Surgical History:  Procedure Laterality Date  . ABDOMINAL AORTOGRAM W/LOWER EXTREMITY N/A 01/27/2017   Procedure: Abdominal Aortogram w/Lower Extremity;  Surgeon: Serafina Mitchell, MD;  Location: [place] [facility];  Service: Cardiovascular;  Laterality: N/A;  . ABDOMINAL AORTOGRAM W/LOWER EXTREMITY N/A 06/08/2017   Procedure: ABDOMINAL AORTOGRAM W/LOWER EXTREMITY;  Surgeon: Serafina Mitchell, MD;  Location: [place] [facility];  Service: Cardiovascular;  Laterality: N/A;  . ABDOMINAL AORTOGRAM W/LOWER EXTREMITY N/A 08/31/2017   Procedure: ABDOMINAL AORTOGRAM W/LOWER EXTREMITY;  Surgeon: Serafina Mitchell, MD;   Location: [place] [facility];  Service: Cardiovascular;  Laterality: N/A;  rt. unilateral  . ANGIOPLASTY / STENTING FEMORAL    . ANGIOPLASTY / STENTING ILIAC    . AORTA - BILATERAL FEMORAL ARTERY BYPASS GRAFT N/A 07/06/2018   Procedure: AORTA BIFEMORAL BYPASS GRAFT USING 14X7MM X 40CM HEMASHIELD GOLD GRAFT;  Surgeon: Serafina Mitchell, MD;  Location: [place];  Service: Vascular;  Laterality: N/A;  . CEREBRAL ANGIOGRAM N/A 05/14/2014   Procedure: CEREBRAL ANGIOGRAM;  Surgeon: Lorretta Harp, MD;  Location[facility] CATH [facility];  Service: Cardiovascular;  Laterality: N/A;  . COLONOSCOPY W/ POLYPECTOMY    . ENDARTERECTOMY Right 09/08/2017  . ENDARTERECTOMY FEMORAL Right 09/08/2017   Procedure: REDO RIGHT FEMORAL ENDARTECTOMY WITH PATCH ANGIOPLASTY.;  Surgeon: Serafina Mitchell, MD;  Location: [place];  Service: Vascular;  Laterality: Right;  . EYE SURGERY Bilateral    cataract  . FEMORAL ARTERY STENT Right 05/12/2010   Stented distally with a 6x100 Abbott absolute stent and proximally with a 6x60 Cook Zilver stent resulting in the reduction of the proximal segment 80% and mid segment 60-70% to 0% residual, LEFT common femoral artery stented with a 7x3 Smart stent resulting in reduction of 90% stenosis to 0% residual  . FEMORAL-POPLITEAL BYPASS GRAFT Right 09/18/2016   Procedure: BYPASS GRAFT FEMORAL-POPLITEAL ARTERY;  Surgeon: Serafina Mitchell, MD;  Location: [place];  Service: Vascular;  Laterality: Right;  . FEMORAL-POPLITEAL BYPASS GRAFT Right 09/08/2017   Procedure: REDO BYPASS GRAFT FEMORAL-POPLITEAL ARTERY;  Surgeon: Serafina Mitchell, MD;  Location: [place];  Service: Vascular;  Laterality: Right;  . FEMORAL-POPLITEAL BYPASS GRAFT Right 07/06/2018   Procedure: REVISION RIGHT FEMORAL TO POPLITEAL ARTERY BYPASS GRAFT;  Surgeon: Serafina Mitchell, MD;  Location: [place];  Service: Vascular;  Laterality: Right;  . IR CHOLANGIOGRAM EXISTING TUBE  08/04/2018  . IR EXCHANGE BILIARY DRAIN  01/22/2019  . IR PERC CHOLECYSTOSTOMY   07/19/2018  . IR RADIOLOGIST EVAL & MGMT  08/31/2018  . IR RADIOLOGIST EVAL & MGMT  01/17/2019  . IR RADIOLOGIST EVAL & MGMT  02/01/2019  . IR RADIOLOGIST EVAL & MGMT  02/08/2019  . KNEE ARTHROSCOPY     left  . LOWER EXTREMITY ANGIOGRAM N/A 05/14/2014   Procedure: LOWER EXTREMITY ANGIOGRAM;  Surgeon: Lorretta Harp, MD;  Location[facility] LAB;  Service: Cardiovascular;  Laterality: N/A;  . LOWER EXTREMITY ANGIOGRAPHY N/A 09/21/2016   Procedure: Lower Extremity Angiography;  Surgeon: Conrad Capron, MD;  Location: [place] CV LAB;  Service: Cardiovascular;  Laterality: N/A;  . PERIPHERAL VASCULAR ATHERECTOMY Right 01/27/2017   Procedure: PERIPHERAL VASCULAR ATHERECTOMY;  Surgeon: Serafina Mitchell, MD;  Location: [place] CV LAB;  Service: Cardiovascular;  Laterality: Right;  . PERIPHERAL VASCULAR BALLOON ANGIOPLASTY Right 06/08/2017   Procedure: PERIPHERAL  VASCULAR BALLOON ANGIOPLASTY;  Surgeon: Serafina Mitchell, MD;  Location: [place] CV LAB;  Service: Cardiovascular;  Laterality: Right;  common femoral and superficial femoral arteries  . PERIPHERAL VASCULAR CATHETERIZATION N/A 04/20/2016   Procedure: Lower Extremity Intervention;  Surgeon: Lorretta Harp, MD;  Location: [place] [facility];  Service: Cardiovascular;  Laterality: N/A;  . PERIPHERAL VASCULAR INTERVENTION  08/31/2017   Procedure: PERIPHERAL VASCULAR INTERVENTION;  Surgeon: Serafina Mitchell, MD;  Location: [place] [facility];  Service: Cardiovascular;;  REIA  . REVERSE SHOULDER ARTHROPLASTY Right 12/07/2016  . REVERSE SHOULDER ARTHROPLASTY Right 12/07/2016   Procedure: REVERSE SHOULDER ARTHROPLASTY;  Surgeon: Netta Cedars, MD;  Location: [place];  Service: Orthopedics;  Laterality: Right;  . ROTATOR CUFF REPAIR Right 2003  . SFA Right 05/14/2014   PTA  OF RT SFA         DR BERRY    FAMILY HISTORY Family History  Problem Relation Age of Onset  . Heart disease Mother   . Leukemia Mother   . Stomach cancer Father   .  Esophageal cancer Brother   . Liver disease Brother   . Alcoholism Brother   . Leukemia Brother   . Leukemia Brother   . Diabetes type II Brother   . Lung disease Sister     SOCIAL HISTORY Social History   Tobacco Use  . Smoking status: Former Smoker    Years: 25.00    Quit date: 06/1988    Years since quitting: 31.7  . Smokeless tobacco: Never Used  Vaping Use  . Vaping Use: Never used  Substance Use Topics  . Alcohol use: Yes    Comment: rarely  . Drug use: No         OPHTHALMIC EXAM:  Base Eye Exam    Visual Acuity (Snellen - Linear)      Right Left   Dist [place] 20/100 CF @ 2'   Dist ph [place] NI NI       Tonometry (Tonopen, 1:40 PM)      Right Left   Pressure 13 13       Pupils      Pupils Dark Light Shape React APD   Right PERRL 3 2 Round Brisk None   Left PERRL 3 2 Irregular Brisk None       Visual Fields (Counting fingers)      Left Right    Full Full       Neuro/Psych    Oriented x3: Yes   Mood/Affect: Normal       Dilation    Both eyes: 1.0% Mydriacyl, 2.5% Phenylephrine @ 1:40 PM        Slit Lamp and Fundus Exam    External Exam      Right Left   External Normal Normal       Slit Lamp Exam      Right Left   Lids/Lashes Normal Normal   Conjunctiva/Sclera White and quiet White and quiet   Cornea Clear Clear   Anterior Chamber Deep and quiet Deep and quiet   Iris Round and reactive Round and reactive   Lens Centered posterior chamber intraocular lens Decentered posterior chamber intraocular lens, stable over time with dense capsule and poor zonular support   Anterior Vitreous Normal Normal       Fundus Exam      Right Left   Posterior Vitreous Posterior vitreous detachment Posterior vitreous detachment   Disc Normal Normal   C/D Ratio 0.1  0.1   Macula Geographic atrophy,  Large Disciform scar   Vessels Normal Normal   Periphery Normal Normal          IMAGING AND PROCEDURES  Imaging and Procedures for 03/25/20  OCT,  Retina - OU - Both Eyes       Right Eye Quality was good. Scan locations included subfoveal. Progression has been stable. Findings include disciform scar.   Left Eye Quality was good. Scan locations included subfoveal. Progression has been stable. Findings include abnormal foveal contour, disciform scar.                 ASSESSMENT/PLAN:  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) No active CNVM OU at this point  Advanced nonexudative age-related macular degeneration of right eye with subfoveal involvement The nature of dry age related macular degeneration was discussed with the patient as well as its possible conversion to wet. The results of the AREDS 2 study was discussed with the patient. A diet rich in dark leafy green vegetables was advised and specific recommendations were made regarding supplements with AREDS 2 formulation . Control of hypertension and serum cholesterol may slow the disease. Smoking cessation is mandatory to slow the disease and diminish the risk of progressing to wet age related macular degeneration. The patient was instructed in the use of an [place] and was told to return immediately for any changes in the Grid. Stressed to the patient do not rub eyes  The accounts for acuity will observe      ICD-10-CM   1. 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)  H35.3222 OCT, Retina - OU - Both Eyes  2. Exudative age-related macular degeneration of right eye with inactive choroidal neovascularization (HCC)  H35.3212 OCT, Retina - OU - Both Eyes  3. Advanced nonexudative age-related macular degeneration of right eye with subfoveal involvement  H35.3114   4. 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 involvement  H35.3124     1.  No signs of active disease OU at this time  2.  3.  Ophthalmic Meds  Ordered this visit:  No orders of the defined types were placed in this encounter.      Return in about 6 months (around 09/22/2020) for DILATE OU, OCT.  Patient Instructions  Patient asked to report if new onset visual acuity declines or difficulties    Explained the diagnoses, plan, and follow up with the patient and they expressed understanding.  Patient expressed understanding of the importance of proper follow up care.   Clent Demark Miah Boye M.D. Diseases & Surgery of the Retina and Vitreous Retina [facility] Diabetic [place] 03/25/20     Abbreviations: M myopia (nearsighted); A astigmatism; H hyperopia (farsighted); P presbyopia; Mrx spectacle prescription;  CTL contact lenses; OD right eye; OS left eye; OU both eyes  XT exotropia; ET esotropia; PEK punctate epithelial keratitis; PEE punctate epithelial erosions; DES dry eye syndrome; MGD meibomian gland dysfunction; ATs artificial tears; PFAT's preservative free artificial tears; [place] nuclear sclerotic cataract; PSC posterior subcapsular cataract; ERM epi-retinal membrane; PVD posterior vitreous detachment; RD retinal detachment; DM diabetes mellitus; DR diabetic retinopathy; NPDR non-proliferative diabetic retinopathy; PDR proliferative diabetic retinopathy; CSME clinically significant macular edema; DME diabetic macular edema; dbh dot blot hemorrhages; CWS cotton wool spot; POAG primary open angle glaucoma; C/D cup-to-disc ratio; HVF humphrey visual field; GVF goldmann visual field; OCT optical coherence tomography; IOP intraocular pressure; BRVO  Branch retinal vein occlusion; CRVO central retinal vein occlusion; CRAO central retinal artery occlusion; BRAO branch retinal artery occlusion; RT retinal tear; SB scleral buckle; PPV pars plana vitrectomy; VH Vitreous hemorrhage; PRP panretinal laser photocoagulation; IVK intravitreal kenalog; VMT vitreomacular traction; MH Macular hole;  NVD neovascularization of the disc; NVE neovascularization  elsewhere; AREDS age related eye disease study; ARMD age related macular degeneration; POAG primary open angle glaucoma; EBMD epithelial/anterior basement membrane dystrophy; ACIOL anterior chamber intraocular lens; IOL intraocular lens; PCIOL posterior chamber intraocular lens; Phaco/IOL phacoemulsification with intraocular lens placement; [place] photorefractive keratectomy; LASIK laser assisted in situ keratomileusis; HTN hypertension; DM diabetes mellitus; COPD chronic obstructive pulmonary disease

## 2020-04-05 DIAGNOSIS — Z23 Encounter for immunization: Secondary | ICD-10-CM | POA: Diagnosis not present

## 2020-04-12 ENCOUNTER — Other Ambulatory Visit: Payer: Self-pay | Admitting: Cardiovascular Disease

## 2020-04-16 ENCOUNTER — Other Ambulatory Visit: Payer: Self-pay | Admitting: Cardiovascular Disease

## 2020-04-16 MED ORDER — CLOPIDOGREL BISULFATE 75 MG PO TABS: 75.0000 mg | ORAL_TABLET | Freq: Every day | ORAL | 3 refills | Status: DC

## 2020-04-16 NOTE — Telephone Encounter (Signed)
New Message   *STAT* If patient is at the pharmacy, call can be transferred to refill team.   1. Which medications need to be refilled? (please list name of each medication and dose if known) clopidogrel (PLAVIX) 75 MG tablet   2. Which pharmacy/location (including street and city if local pharmacy) is medication to be sent to? [place], [place]  [address] [place] [address]   3. Do they need a 30 day or 90 day supply? 64  Pt says [facility] has not gotten the refill request and pt only has 2 tablets left

## 2020-04-16 NOTE — Telephone Encounter (Signed)
Rx has been sent to the pharmacy electronically. ° °

## 2020-04-18 DIAGNOSIS — I739 Peripheral vascular disease, unspecified: Secondary | ICD-10-CM | POA: Diagnosis not present

## 2020-04-18 DIAGNOSIS — L602 Onychogryphosis: Secondary | ICD-10-CM | POA: Diagnosis not present

## 2020-04-27 DIAGNOSIS — Z23 Encounter for immunization: Secondary | ICD-10-CM | POA: Diagnosis not present

## 2020-05-20 DIAGNOSIS — Z125 Encounter for screening for malignant neoplasm of prostate: Secondary | ICD-10-CM | POA: Diagnosis not present

## 2020-05-20 DIAGNOSIS — E785 Hyperlipidemia, unspecified: Secondary | ICD-10-CM | POA: Diagnosis not present

## 2020-05-20 DIAGNOSIS — E1151 Type 2 diabetes mellitus with diabetic peripheral angiopathy without gangrene: Secondary | ICD-10-CM | POA: Diagnosis not present

## 2020-05-21 DIAGNOSIS — D649 Anemia, unspecified: Secondary | ICD-10-CM | POA: Diagnosis not present

## 2020-05-27 DIAGNOSIS — R82998 Other abnormal findings in urine: Secondary | ICD-10-CM | POA: Diagnosis not present

## 2020-05-27 DIAGNOSIS — Z0000 Encounter for general adult medical examination without abnormal findings: Secondary | ICD-10-CM | POA: Diagnosis not present

## 2020-05-27 DIAGNOSIS — I129 Hypertensive chronic kidney disease with stage 1 through stage 4 chronic kidney disease, or unspecified chronic kidney disease: Secondary | ICD-10-CM | POA: Diagnosis not present

## 2020-05-27 DIAGNOSIS — I2510 Atherosclerotic heart disease of native coronary artery without angina pectoris: Secondary | ICD-10-CM | POA: Diagnosis not present

## 2020-05-27 DIAGNOSIS — N1831 Chronic kidney disease, stage 3a: Secondary | ICD-10-CM | POA: Diagnosis not present

## 2020-05-27 DIAGNOSIS — E1151 Type 2 diabetes mellitus with diabetic peripheral angiopathy without gangrene: Secondary | ICD-10-CM | POA: Diagnosis not present

## 2020-05-27 DIAGNOSIS — K5730 Diverticulosis of large intestine without perforation or abscess without bleeding: Secondary | ICD-10-CM | POA: Diagnosis not present

## 2020-05-27 DIAGNOSIS — I739 Peripheral vascular disease, unspecified: Secondary | ICD-10-CM | POA: Diagnosis not present

## 2020-05-27 DIAGNOSIS — I10 Essential (primary) hypertension: Secondary | ICD-10-CM | POA: Diagnosis not present

## 2020-05-27 DIAGNOSIS — H3530 Unspecified macular degeneration: Secondary | ICD-10-CM | POA: Diagnosis not present

## 2020-05-27 DIAGNOSIS — M4810 Ankylosing hyperostosis [Forestier], site unspecified: Secondary | ICD-10-CM | POA: Diagnosis not present

## 2020-05-27 DIAGNOSIS — E785 Hyperlipidemia, unspecified: Secondary | ICD-10-CM | POA: Diagnosis not present

## 2020-05-27 DIAGNOSIS — D62 Acute posthemorrhagic anemia: Secondary | ICD-10-CM | POA: Diagnosis not present

## 2020-06-14 ENCOUNTER — Ambulatory Visit (HOSPITAL_COMMUNITY)
Admission: RE | Admit: 2020-06-14 | Discharge: 2020-06-14 | Disposition: A | Discharge status: Home / Self Care | Payer: Medicare Other | Source: Ambulatory Visit | Attending: Cardiovascular Disease | Admitting: Cardiovascular Disease

## 2020-06-14 ENCOUNTER — Other Ambulatory Visit: Payer: Self-pay

## 2020-06-14 DIAGNOSIS — I6523 Occlusion and stenosis of bilateral carotid arteries: Secondary | ICD-10-CM | POA: Diagnosis not present

## 2020-06-20 DIAGNOSIS — Z1152 Encounter for screening for COVID-19: Secondary | ICD-10-CM | POA: Diagnosis not present

## 2020-06-20 DIAGNOSIS — R059 Cough, unspecified: Secondary | ICD-10-CM | POA: Diagnosis not present

## 2020-06-20 DIAGNOSIS — J069 Acute upper respiratory infection, unspecified: Secondary | ICD-10-CM | POA: Diagnosis not present

## 2020-06-20 DIAGNOSIS — R509 Fever, unspecified: Secondary | ICD-10-CM | POA: Diagnosis not present

## 2020-06-25 DIAGNOSIS — Z1212 Encounter for screening for malignant neoplasm of rectum: Secondary | ICD-10-CM | POA: Diagnosis not present

## 2020-08-01 DIAGNOSIS — L602 Onychogryphosis: Secondary | ICD-10-CM | POA: Diagnosis not present

## 2020-08-01 DIAGNOSIS — I739 Peripheral vascular disease, unspecified: Secondary | ICD-10-CM | POA: Diagnosis not present

## 2020-08-16 ENCOUNTER — Encounter: Payer: Self-pay | Admitting: Gastroenterology

## 2020-08-23 ENCOUNTER — Encounter: Payer: Self-pay | Admitting: Nurse Practitioner

## 2020-09-04 ENCOUNTER — Other Ambulatory Visit (INDEPENDENT_AMBULATORY_CARE_PROVIDER_SITE_OTHER): Payer: Medicare Other

## 2020-09-04 ENCOUNTER — Ambulatory Visit (INDEPENDENT_AMBULATORY_CARE_PROVIDER_SITE_OTHER): Payer: Medicare Other | Admitting: Nurse Practitioner

## 2020-09-04 ENCOUNTER — Encounter: Payer: Self-pay | Admitting: Nurse Practitioner

## 2020-09-04 VITALS — BP 134/56 | HR 64 | Ht 65.0 in | Wt 157.0 lb

## 2020-09-04 DIAGNOSIS — Z86010 Personal history of colonic polyps: Secondary | ICD-10-CM

## 2020-09-04 DIAGNOSIS — D649 Anemia, unspecified: Secondary | ICD-10-CM

## 2020-09-04 DIAGNOSIS — I739 Peripheral vascular disease, unspecified: Secondary | ICD-10-CM

## 2020-09-04 LAB — CBC
HCT: 32.3 % — ABNORMAL LOW (ref 39.0–52.0)
Hemoglobin: 11 g/dL — ABNORMAL LOW (ref 13.0–17.0)
MCHC: 34.1 g/dL (ref 30.0–36.0)
MCV: 88 fl (ref 78.0–100.0)
Platelets: 271 10*3/uL (ref 150.0–400.0)
RBC: 3.67 Mil/uL — ABNORMAL LOW (ref 4.22–5.81)
RDW: 13.1 % (ref 11.5–15.5)
WBC: 8.2 10*3/uL (ref 4.0–10.5)

## 2020-09-04 NOTE — Patient Instructions (Addendum)
If you are age 79 or older, your body mass index should be between 23-30. Your Body mass index is 26.13 kg/m. If this is out of the aforementioned range listed, please consider follow up with your Primary Care Provider.  RECOMMENDATIONS:  Desitin: Apply a small amount to the external anal area three times a day as needed.  LABS:  Lab work has been ordered for you today. Our lab is located in the basement. Press "B" on the elevator. The lab is located at the first door on the left as you exit the elevator.  HEALTHCARE LAWS AND MY CHART RESULTS: Due to recent changes in healthcare laws, you may see the results of your imaging and laboratory studies on MyChart before your provider has had a chance to review them.   We understand that in some cases there may be results that are confusing or concerning to you. Not all laboratory results come back in the same time frame and the provider may be waiting for multiple results in order to interpret others.  Please give [place] 48 hours in order for your provider to thoroughly review all the results before contacting the office for clarification of your results.   We will contact you after speaking with Dr. Loletha Carrow regarding colonoscopy. It was great seeing you today! Thank you for entrusting me with your care and choosing [facility].  Noralyn Pick, CRNP

## 2020-09-04 NOTE — Progress Notes (Addendum)
09/04/2020 Diontae Route [id number] 1940-07-04   CHIEF COMPLAINT: Schedule a colonoscopy   HISTORY OF PRESENT ILLNESS:  Chukwuemeka Artola is a 80 year old male with a past medical history significant for hypertension, hyperlipidemia, anemia, peripheral arterial disease s/p left common iliac artery stent 20 years ago, s/p left external iliac stent and right SFA stent x 2 in 2011, right SFA in stent stenosis s/p PTCA 2015, s/p right fem-pop graft bypass 08/2016, s/p aortobifemoral bypass grafting and revision of right fem-pop bypass graft 11/6597 complicated by post operative acalculous cystitis which required a cholecystostomy tube x 7 months with subsequent E. coli bacteremia s/p drain exchange 01/22/2019, cholecystostomy drain dc'd 02/08/2019, right internal artery stenosis, DM II, CKD stage III, osteoarthritis, hiatal hernia, GERD and colon polyps.   He presents to our office today to schedule a colonoscopy. He denies having any upper or lower abdominal pain. No heartburn or dysphagia. He is on Pantoprazole 40mg  QD. EGD 05/2017 showed a hyperplastic gastric polyp otherwise was normal. He passes a normal brown formed BM daily. He sees a small amount of bright red blood on the toilet tissue if he sits on the commode for a prolonged period of time. He easily passes a soft stool but sometimes he does not feel emptied so he strains in attempt to empty his rectum. He sees the bright red blood on the tissue on the days he strains. No associated rectal pain. He remains on Plavix 75mg  daily secondary to PAD.  He underwent a colonoscopy by Dr. Loletha Carrow on 11/03/2016 which identified five 2 to 4 tubular adenomatous/sessile serrated polyps which were removed from the ascending colon and cecum, one 73mm sessile serrated polyp was removed from the proximal ascending colon, two 2 to 4 mm tubular adenomatous polyps were removed from the splenic flexure and left colon diverticulosis and internal hemorrhoids were noted. A recall  colonoscopy in 3 years was recommended.   He stated all 8 of his siblings are deceased. One sibling died from stomach cancer, one died from throat cancer and esophageal cancer  and another died from leukemia. No know family history of colorectal cancer. He stated he has out lived all of his siblings and he lives year by year not expecting to live past the age of 70.   He remains on Plavix secondary to his significant PAD followed by Dr. Quay Burow as noted above.   EGD 06/02/2017: Normal esophagus. - A single 10 -79mm  gastric polyp. Biopsied. - Normal examined duodenum.  Colonoscopy 11/03/2016:  - Five 2 to 4 mm polyps in the proximal ascending colon and in the cecum, removed with a cold biopsy forceps. Resected and retrieved. - One 8 mm polyp in the proximal ascending colon, removed with a hot snare. Resected and retrieved. - Two 2 to 4 mm polyps at the splenic flexure, removed with a cold biopsy forceps. Resected and retrieved. - Diverticulosis in the left colon. - Internal hemorrhoids. - The examination was otherwise normal on direct and retroflexion views. Biopsy Report: Diagnosis 1. Surgical [P], gastric, polyp - HYPERPLASTIC POLYP(S). - THERE IS NO EVIDENCE OF MALIGNANCY. 2. Surgical [P], cecum, polyp (5) - TUBULAR ADENOMA(S). - HIGH GRADE DYSPLASIA IS NOT IDENTIFIED. 3. Surgical [P], ascending, polyp - SESSILE SERRATED ADENOMA WITHOUT CYTOLOGIC DYSPLASIA. 4. Surgical [P], transverse, polyp (2) - TUBULAR ADENOMA(S). - HIGH GRADE DYSPLASIA IS NOT IDENTIFIED  CBC Latest Ref Rng & Units 01/23/2019 01/22/2019 01/21/2019  WBC 4.0 - 10.5 K/uL 5.1 5.3 5.4  Hemoglobin 13.0 - 17.0 g/dL 8.0(L) 8.9(L) 8.8(L)  Hematocrit 39.0 - 52.0 % 24.2(L) 26.8(L) 26.8(L)  Platelets 150 - 400 K/uL 185 179 160   CMP Latest Ref Rng & Units 01/23/2019 01/22/2019 01/21/2019  Glucose 70 - 99 mg/dL 163(H) 127(H) 111(H)  BUN 8 - 23 mg/dL 18 18 26(H)  Creatinine 0.61 - 1.24 mg/dL 1.48(H) 1.58(H)  1.59(H)  Sodium 135 - 145 mmol/L 139 139 138  Potassium 3.5 - 5.1 mmol/L 3.9 4.1 4.2  Chloride 98 - 111 mmol/L 106 107 107  CO2 22 - 32 mmol/L 25 22 20(L)  Calcium 8.9 - 10.3 mg/dL 8.5(L) 8.7(L) 8.6(L)  Total Protein 6.5 - 8.1 g/dL 5.9(L) - -  Total Bilirubin 0.3 - 1.2 mg/dL 0.3 - -  Alkaline Phos 38 - 126 U/L 87 - -  AST 15 - 41 U/L 20 - -  ALT 0 - 44 U/L 18 - -     Past Medical History:  Diagnosis Date  . Anemia   . Complication of anesthesia   . Diabetes mellitus ([place])    TYPE 2  . GERD (gastroesophageal reflux disease)   . History of blood transfusion    GI bleed  . History of colon polyps   . History of hiatal hernia   . Hyperlipemia   . Hypertension   . Osteoarthritis   . PAD (peripheral artery disease) ([facility])    a. stenting of his left common iliac artery >20 years ago. b. h/o LEIA stent and 2 stents to R SFA in 2011. c. 04/2014:  s/p PTA of right SFA for in-stent restenosis, occluded left SFA  . PONV (postoperative nausea and vomiting)    no porblem with the last 3 surgeries  . RBBB (right bundle branch block with left anterior fascicular block)    NUCLEAR STRESS TEST, 08/18/2010 - no significant wall motion abnoramlities noted, post-stress EF 69%, normal myocardial perfusion study  . Sinus tachycardia    a. Noted during admission 04/2014 but upon review seems to be frequent finding for patient.  . Stenosis of carotid artery    a. 50% right carotid stenosis by angiogram 04/2014.   Past Surgical History:  Procedure Laterality Date  . ABDOMINAL AORTOGRAM W/LOWER EXTREMITY N/A 01/27/2017   Procedure: Abdominal Aortogram w/Lower Extremity;  Surgeon: Serafina Mitchell, MD;  Location: [facility];  Service: Cardiovascular;  Laterality: N/A;  . ABDOMINAL AORTOGRAM W/LOWER EXTREMITY N/A 06/08/2017   Procedure: ABDOMINAL AORTOGRAM W/LOWER EXTREMITY;  Surgeon: Serafina Mitchell, MD;  Location: [facility];  Service: Cardiovascular;  Laterality: N/A;  . ABDOMINAL  AORTOGRAM W/LOWER EXTREMITY N/A 08/31/2017   Procedure: ABDOMINAL AORTOGRAM W/LOWER EXTREMITY;  Surgeon: Serafina Mitchell, MD;  Location: [facility];  Service: Cardiovascular;  Laterality: N/A;  rt. unilateral  . ANGIOPLASTY / STENTING FEMORAL    . ANGIOPLASTY / STENTING ILIAC    . AORTA - BILATERAL FEMORAL ARTERY BYPASS GRAFT N/A 07/06/2018   Procedure: AORTA BIFEMORAL BYPASS GRAFT USING 14X7MM X 40CM HEMASHIELD GOLD GRAFT;  Surgeon: Serafina Mitchell, MD;  Location: [facility];  Service: Vascular;  Laterality: N/A;  . CEREBRAL ANGIOGRAM N/A 05/14/2014   Procedure: CEREBRAL ANGIOGRAM;  Surgeon: Lorretta Harp, MD;  Location: [facility];  Service: Cardiovascular;  Laterality: N/A;  . COLONOSCOPY W/ POLYPECTOMY    . ENDARTERECTOMY Right 09/08/2017  . ENDARTERECTOMY FEMORAL Right 09/08/2017   Procedure: REDO RIGHT FEMORAL ENDARTECTOMY WITH PATCH ANGIOPLASTY.;  Surgeon: Serafina Mitchell, MD;  Location: [facility];  Service: Vascular;  Laterality: Right;  . EYE SURGERY Bilateral    cataract  . FEMORAL ARTERY STENT Right 05/12/2010   Stented distally with a 6x100 Abbott absolute stent and proximally with a 6x60 Cook Zilver stent resulting in the reduction of the proximal segment 80% and mid segment 60-70% to 0% residual, LEFT common femoral artery stented with a 7x3 Smart stent resulting in reduction of 90% stenosis to 0% residual  . FEMORAL-POPLITEAL BYPASS GRAFT Right 09/18/2016   Procedure: BYPASS GRAFT FEMORAL-POPLITEAL ARTERY;  Surgeon: Serafina Mitchell, MD;  Location: [facility];  Service: Vascular;  Laterality: Right;  . FEMORAL-POPLITEAL BYPASS GRAFT Right 09/08/2017   Procedure: REDO BYPASS GRAFT FEMORAL-POPLITEAL ARTERY;  Surgeon: Serafina Mitchell, MD;  Location: [place];  Service: Vascular;  Laterality: Right;  . FEMORAL-POPLITEAL BYPASS GRAFT Right 07/06/2018   Procedure: REVISION RIGHT FEMORAL TO POPLITEAL ARTERY BYPASS GRAFT;  Surgeon: Serafina Mitchell, MD;  Location: [facility];  Service: Vascular;   Laterality: Right;  . IR CHOLANGIOGRAM EXISTING TUBE  08/04/2018  . IR EXCHANGE BILIARY DRAIN  01/22/2019  . IR PERC CHOLECYSTOSTOMY  07/19/2018  . IR RADIOLOGIST EVAL & MGMT  08/31/2018  . IR RADIOLOGIST EVAL & MGMT  01/17/2019  . IR RADIOLOGIST EVAL & MGMT  02/01/2019  . IR RADIOLOGIST EVAL & MGMT  02/08/2019  . KNEE ARTHROSCOPY     left  . LOWER EXTREMITY ANGIOGRAM N/A 05/14/2014   Procedure: LOWER EXTREMITY ANGIOGRAM;  Surgeon: Lorretta Harp, MD;  Location: [facility] CATH LAB;  Service: Cardiovascular;  Laterality: N/A;  . LOWER EXTREMITY ANGIOGRAPHY N/A 09/21/2016   Procedure: Lower Extremity Angiography;  Surgeon: Conrad St. Rose, MD;  Location: [place] [facility];  Service: Cardiovascular;  Laterality: N/A;  . PERIPHERAL VASCULAR ATHERECTOMY Right 01/27/2017   Procedure: PERIPHERAL VASCULAR ATHERECTOMY;  Surgeon: Serafina Mitchell, MD;  Location: [facility];  Service: Cardiovascular;  Laterality: Right;  . PERIPHERAL VASCULAR BALLOON ANGIOPLASTY Right 06/08/2017   Procedure: PERIPHERAL VASCULAR BALLOON ANGIOPLASTY;  Surgeon: Serafina Mitchell, MD;  Location: [facility];  Service: Cardiovascular;  Laterality: Right;  common femoral and superficial femoral arteries  . PERIPHERAL VASCULAR CATHETERIZATION N/A 04/20/2016   Procedure: Lower Extremity Intervention;  Surgeon: Lorretta Harp, MD;  Location: [facility];  Service: Cardiovascular;  Laterality: N/A;  . PERIPHERAL VASCULAR INTERVENTION  08/31/2017   Procedure: PERIPHERAL VASCULAR INTERVENTION;  Surgeon: Serafina Mitchell, MD;  Location: [facility];  Service: Cardiovascular;;  REIA  . REVERSE SHOULDER ARTHROPLASTY Right 12/07/2016  . REVERSE SHOULDER ARTHROPLASTY Right 12/07/2016   Procedure: REVERSE SHOULDER ARTHROPLASTY;  Surgeon: Netta Cedars, MD;  Location: [facility];  Service: Orthopedics;  Laterality: Right;  . ROTATOR CUFF REPAIR Right 2003  . SFA Right 05/14/2014   PTA  OF RT SFA         DR BERRY   Social History:  He is married. He has 2 sons. He smoked cigarettes 3ppd, quit smoking 20 years ago. No alcohol. No drug use.   Family History: Father with history of stomach cancer. Brother with alcoholism, esophageal cancer   Allergies  Allergen Reactions  . Asa [Aspirin] Other (See Comments)    GI bleeding  . Gadolinium Derivatives Itching, Swelling and Other (See Comments)    Pt complained of face flushing/hottness and throat tightness/scratchiness immediately after the injections.  Within 4 minutes, all symptoms were gone and the study was completed.  No further complications  or signs of allergy were exhibited after completion of study.   . Oxycodone-Acetaminophen Other (See Comments)    Dizziness and feeling of being uncomfortable       Outpatient Encounter Medications as of 09/04/2020  Medication Sig  . clopidogrel (PLAVIX) 75 MG tablet Take 1 tablet (75 mg total) by mouth daily.  [name] lisinopril (ZESTRIL) 5 MG tablet Take 5 mg by mouth 2 (two) times daily.  . metFORMIN (GLUCOPHAGE) 1000 MG tablet Take 1,000 mg by mouth 2 (two) times daily.  . pantoprazole (PROTONIX) 40 MG tablet TAKE 1 TABLET TWICE A DAY  . rosuvastatin (CRESTOR) 20 MG tablet Take 20 mg by mouth every evening.   . TOPROL XL 25 MG 24 hr tablet    No facility-administered encounter medications on file as of 09/04/2020.   REVIEW OF SYSTEMS:  Gen: Denies fever, sweats or chills. No weight loss.  CV: Denies chest pain, palpitations or edema. + bilateral leg pain after walking 100 steps.  Resp: Denies cough, shortness of breath of hemoptysis.  GI: See HPI.  GU : Denies urinary burning, blood in urine, increased urinary frequency or incontinence. MS: Denies joint pain, muscles aches or weakness. Derm: Denies rash, itchiness, skin lesions or unhealing ulcers. Psych: Denies depression, anxiety or memory loss.  Heme: Denies bruising, bleeding. Neuro:  Denies headaches or dizziness.  Endo:  + DM.   PHYSICAL EXAM: BP (!) 134/56   Pulse  64   Ht 5\' 5"  (1.651 m)   Wt 157 lb (71.2 kg)   BMI 26.13 kg/m   General: 80 year old male in no acute distress. Head: Normocephalic and atraumatic. Eyes:  Sclerae non-icteric, conjunctive pink. Ears: Normal auditory acuity. Mouth: Dentition intact. No ulcers or lesions.  Neck: Supple, no lymphadenopathy or thyromegaly.  Lungs: Clear bilaterally to auscultation without wheezes, crackles or rhonchi. Heart: Regular rate and rhythm. No murmur, rub or gallop appreciated.  Abdomen: Soft, nontender, non distended. No masses. No hepatosplenomegaly. Normoactive bowel sounds x 4 quadrants. Midline abdominal scar intact.  Rectal: No external hemorrhoids. Minor anal irritation. No blood or stool in the rectal vault. Melissa CMA present during exam.  Musculoskeletal: Symmetrical with no gross deformities. Skin: Warm and dry. No rash or lesions on visible extremities. Extremities: No edema. LEs with brawny discoloration.  Neurological: Alert oriented x 4, no focal deficits.  Psychological:  Alert and cooperative. Normal mood and affect.  ASSESSMENT AND PLAN:  58. 80 year old male with a history of eight 2 to 8 mm tubular adenomatous and sessile serrated colon polyps removed from the colon per colonoscopy 11/03/2016, see colonoscopy and biopsy report above. Recall colonoscopy in 3 years previously recommended. Intermittent rectal bleeding.  -Mr. Besecker has a significant history of PAD s/p left common iliac artery stent placement 20 years ago, s/p left external iliac stent and right SFA stent x 2 in 2011, right SFA in stent stenosis s/p PTCA 2015, s/p right fem-pop graft bypass 08/2016, s/p aortobifemoral bypass grafting and revision of right fem-pop bypass graft 06/2018 with persistent LEs pain. If a colonoscopy pursued, Plavix would need to be held for 5 days and he would most likely require Lovenox bridge.  I am not confident that the benefits of a colonoscopy would out weight the risks for Mr. Eckardt in  the setting of severe PAD at the age of 42. Consider a colonoscopy on Plavix to survey for colon polyps verses no further colonoscopies. The patient stated he would agree with Dr. Loletha Carrow' recommendations.  -  Await recommendations from Dr. Loletha Carrow regarding colonoscopy evaluation  -CBC -Apply a small amount of Desitin inside the anal opening and to the external anal area tid as needed for anal irritation/bleeding.   2. PAD on Plavix as noted above   3. Right carotid artery stenosis   4. DM II  5. Chronic normocytic anemia  -CBC  5. CKD stage III  6. GERD, stable. Hyperplastic gastric polyp. Remains on Pantoprazole 40mg  QD.       CC:  Avva, Ravisankar, MD

## 2020-09-05 ENCOUNTER — Telehealth: Payer: Self-pay | Admitting: Nurse Practitioner

## 2020-09-05 NOTE — Telephone Encounter (Signed)
Patient called and would like to speak with Jaclyn Shaggy again regarding the conversation from this morning in reference to possible colonoscopy.

## 2020-09-05 NOTE — Telephone Encounter (Signed)
Spoke to patient to provide written information he was requesting indicating why his screening colonoscopy was contraindicated. Letter mailed to patient

## 2020-09-05 NOTE — Progress Notes (Signed)
I called [name] and provided him with Dr. Loletha Carrow' recommendations not to proceed with a surveillance colonoscopy as the benefits do not outweigh the risks.  He will call our office if his intermittent rectal bleeding worsens.

## 2020-09-05 NOTE — Progress Notes (Signed)
____________________________________________________________  Attending physician addendum:  Thank you for sending this case to me. I have reviewed the entire note and agree with the plan.  I appreciate your thoughtful consideration of the risks and benefits given this patient's age and medical condition and Plavix use. As far as I know, bridging Lovenox is not used when patients have a hold of antiplatelet agent prior to her procedure.  I also would not perform the procedure on Plavix.  Not withstanding that, although he is at increased risk of recurrent colon polyps based on prior history, I feel that his age, use of antiplatelet agent and complex vascular disease make the risks of a surveillance colonoscopy greater than the expected benefits.  So I do not recommend a surveillance colonoscopy for him.  Please pass that advice on to him.  Wilfrid Lund, MD  ____________________________________________________________

## 2020-09-05 NOTE — Progress Notes (Signed)
I called [name] and provided him with Dr. Loletha Carrow' commendations not to proceed with a surveillance colonoscopy as the benefits do not outweigh the risks.  He will call our office if his intermittent rectal bleeding worsens.

## 2020-09-16 IMAGING — DX DG ABD PORTABLE 1V
1 series · 1 of 1 positions shown · non-contrast
Comparison: None.

CLINICAL DATA: Postop

EXAM:
PORTABLE ABDOMEN - 1 VIEW

[abdomen]
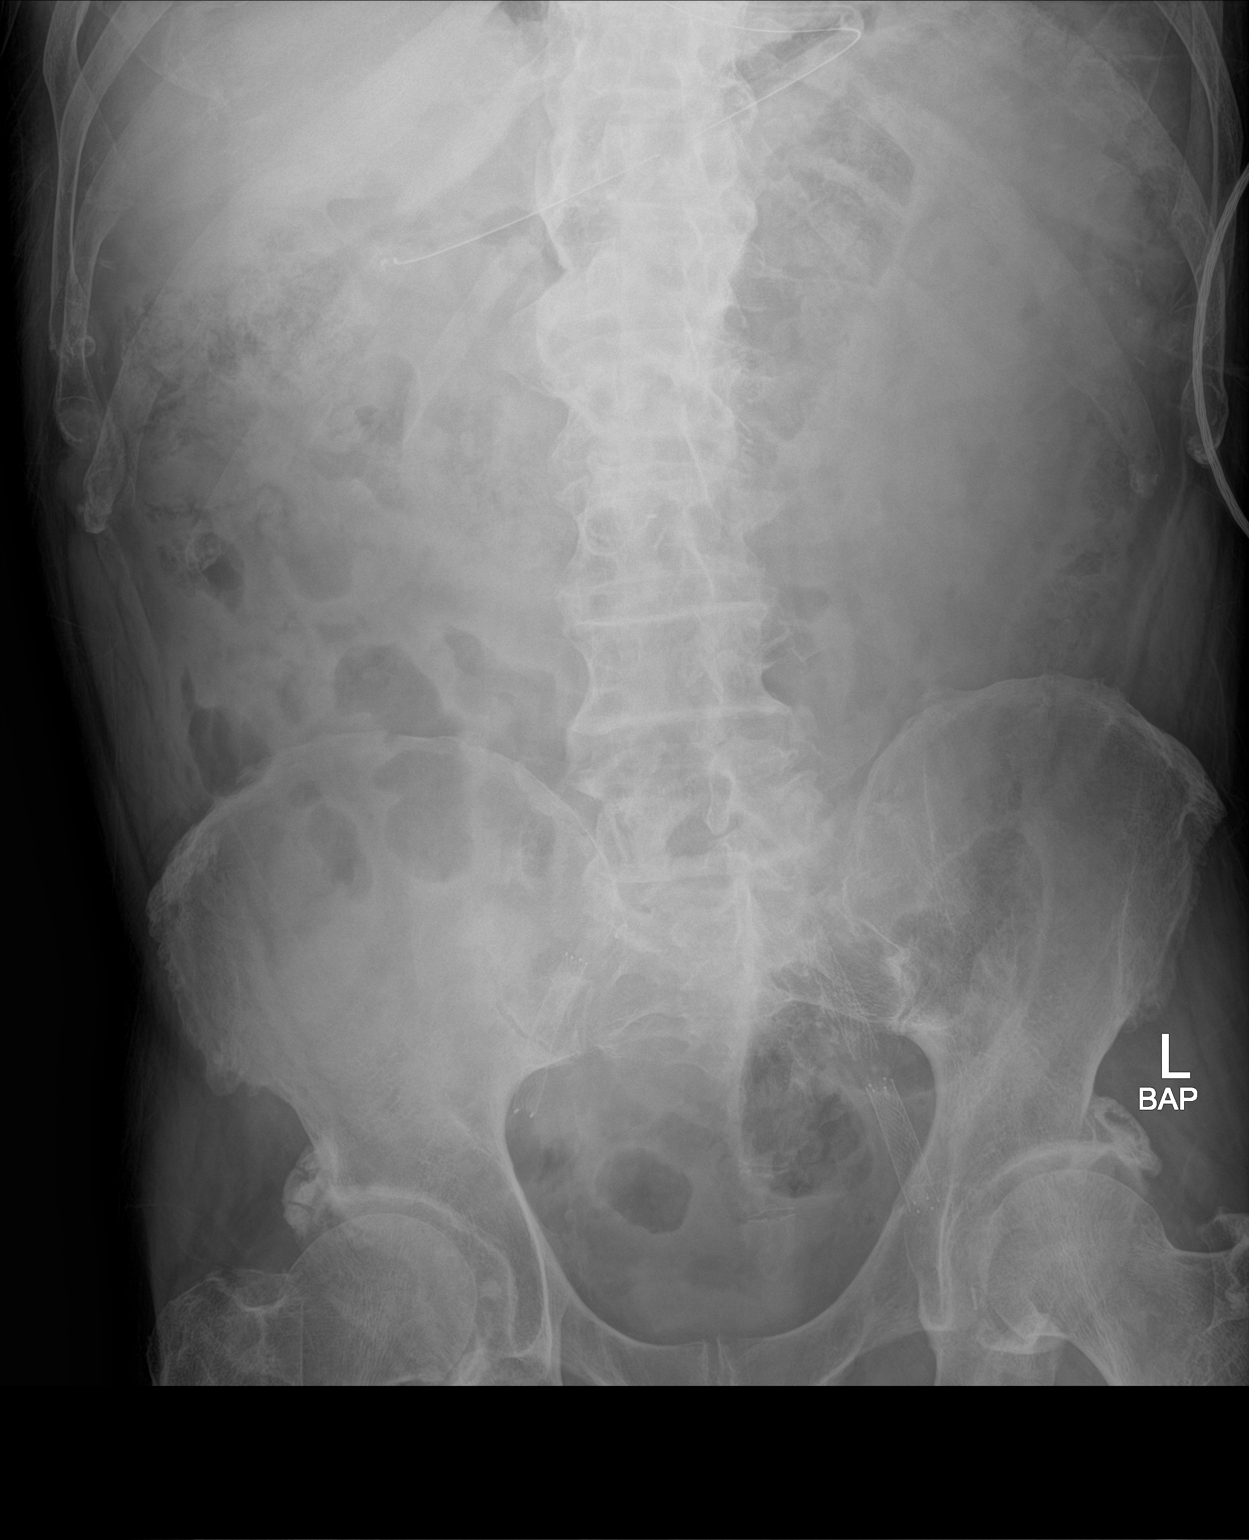

[1 of 1 positions shown; findings below may reference images not displayed]

FINDINGS: Esophageal tube tip projects over gastric outlet region. Visible gas
pattern is nonobstructed. Bilateral iliac stents. No radiopaque
calculi.
IMPRESSION: 1. Esophageal tube tip overlies the gastric outlet
2. Nonobstructed bowel-gas pattern.

## 2020-09-16 IMAGING — DX DG CHEST 1V PORT
1 series · 1 of 1 positions shown · non-contrast
Comparison: CT 02/03/2018

CLINICAL DATA: Postop

EXAM:
PORTABLE CHEST 1 VIEW

[chest]
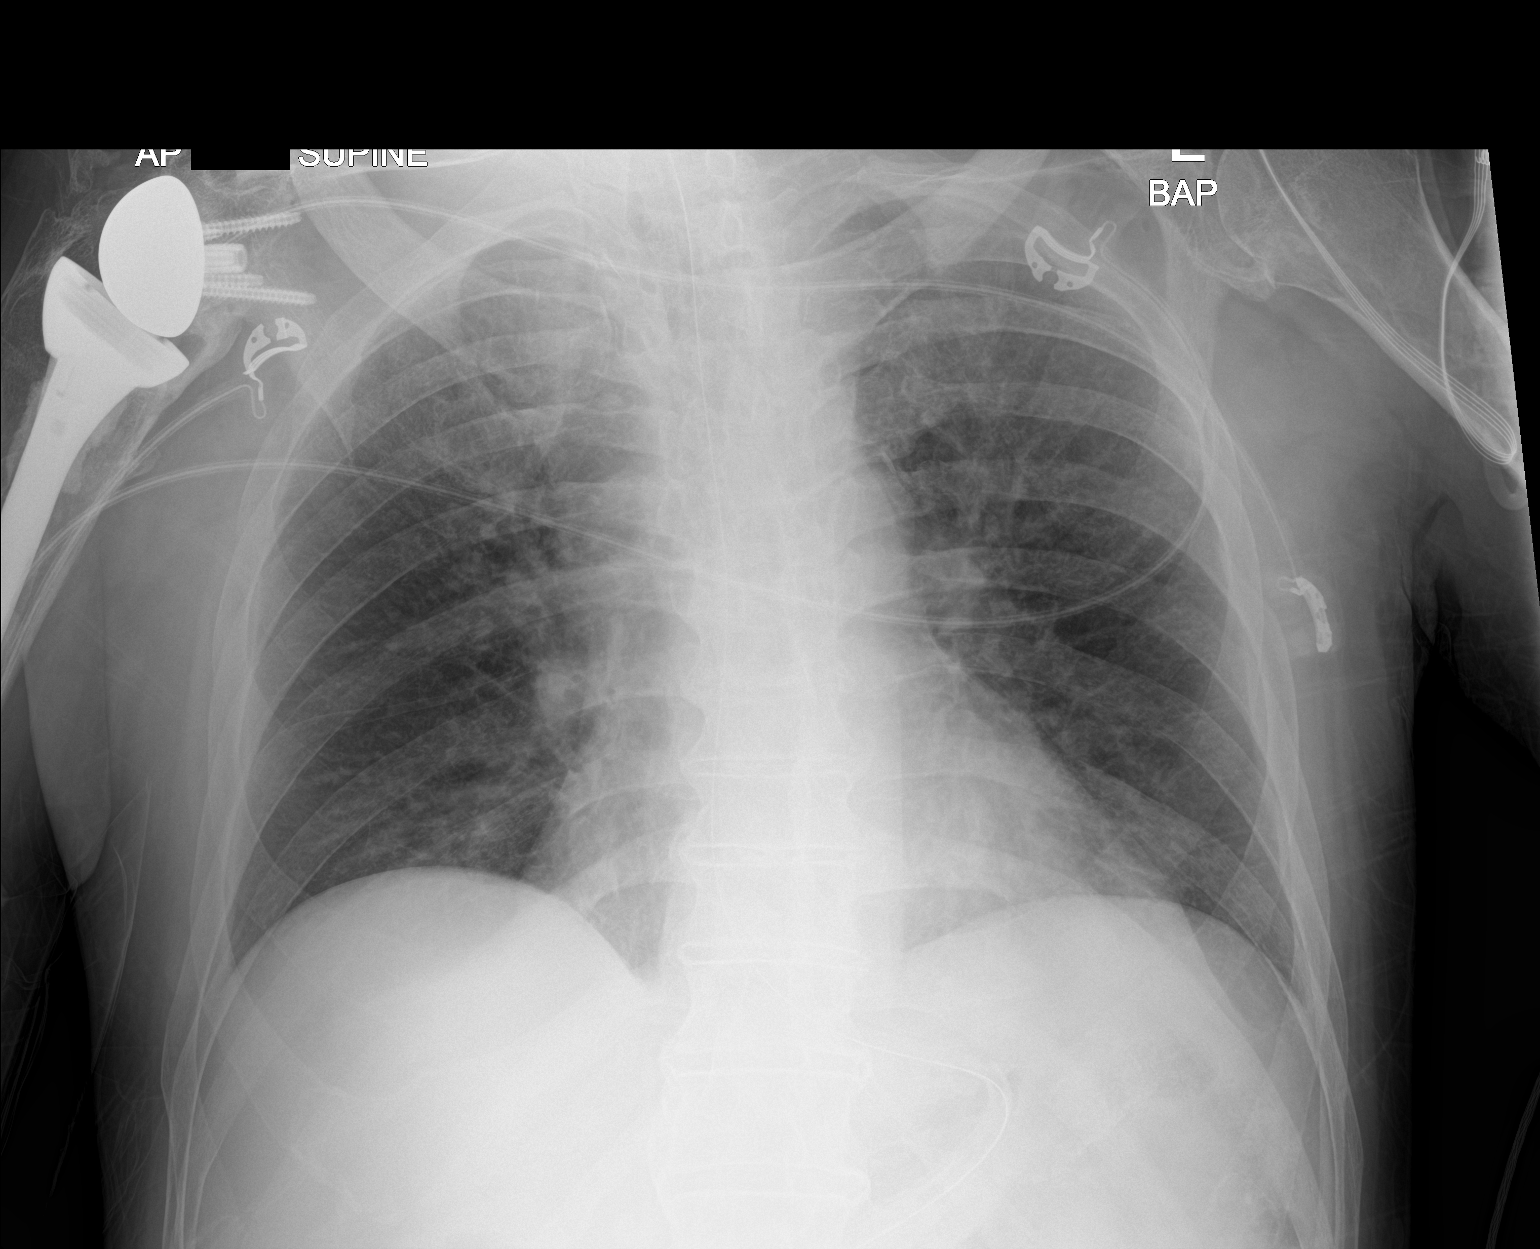

[1 of 1 positions shown; findings below may reference images not displayed]

FINDINGS: Esophageal tube tip is below the diaphragm but non included. Status
post right shoulder replacement. Subsegmental atelectasis at the
left base. No focal consolidation or effusion. Normal heart size. No
pneumothorax.
IMPRESSION: Low lung volumes with subsegmental atelectasis at the left base.

## 2020-09-19 ENCOUNTER — Other Ambulatory Visit: Payer: Self-pay | Admitting: Cardiovascular Disease

## 2020-09-23 ENCOUNTER — Encounter (INDEPENDENT_AMBULATORY_CARE_PROVIDER_SITE_OTHER): Payer: Medicare Other | Admitting: Ophthalmology

## 2020-09-30 ENCOUNTER — Encounter (INDEPENDENT_AMBULATORY_CARE_PROVIDER_SITE_OTHER): Payer: Medicare Other | Admitting: Ophthalmology

## 2020-09-30 IMAGING — DX DG ABD PORTABLE 1V
1 series · 1 of 1 positions shown · non-contrast
Comparison: Film from earlier in the same day.

CLINICAL DATA: Check gastric catheter placement

EXAM:
PORTABLE ABDOMEN - 1 VIEW

[abdomen]
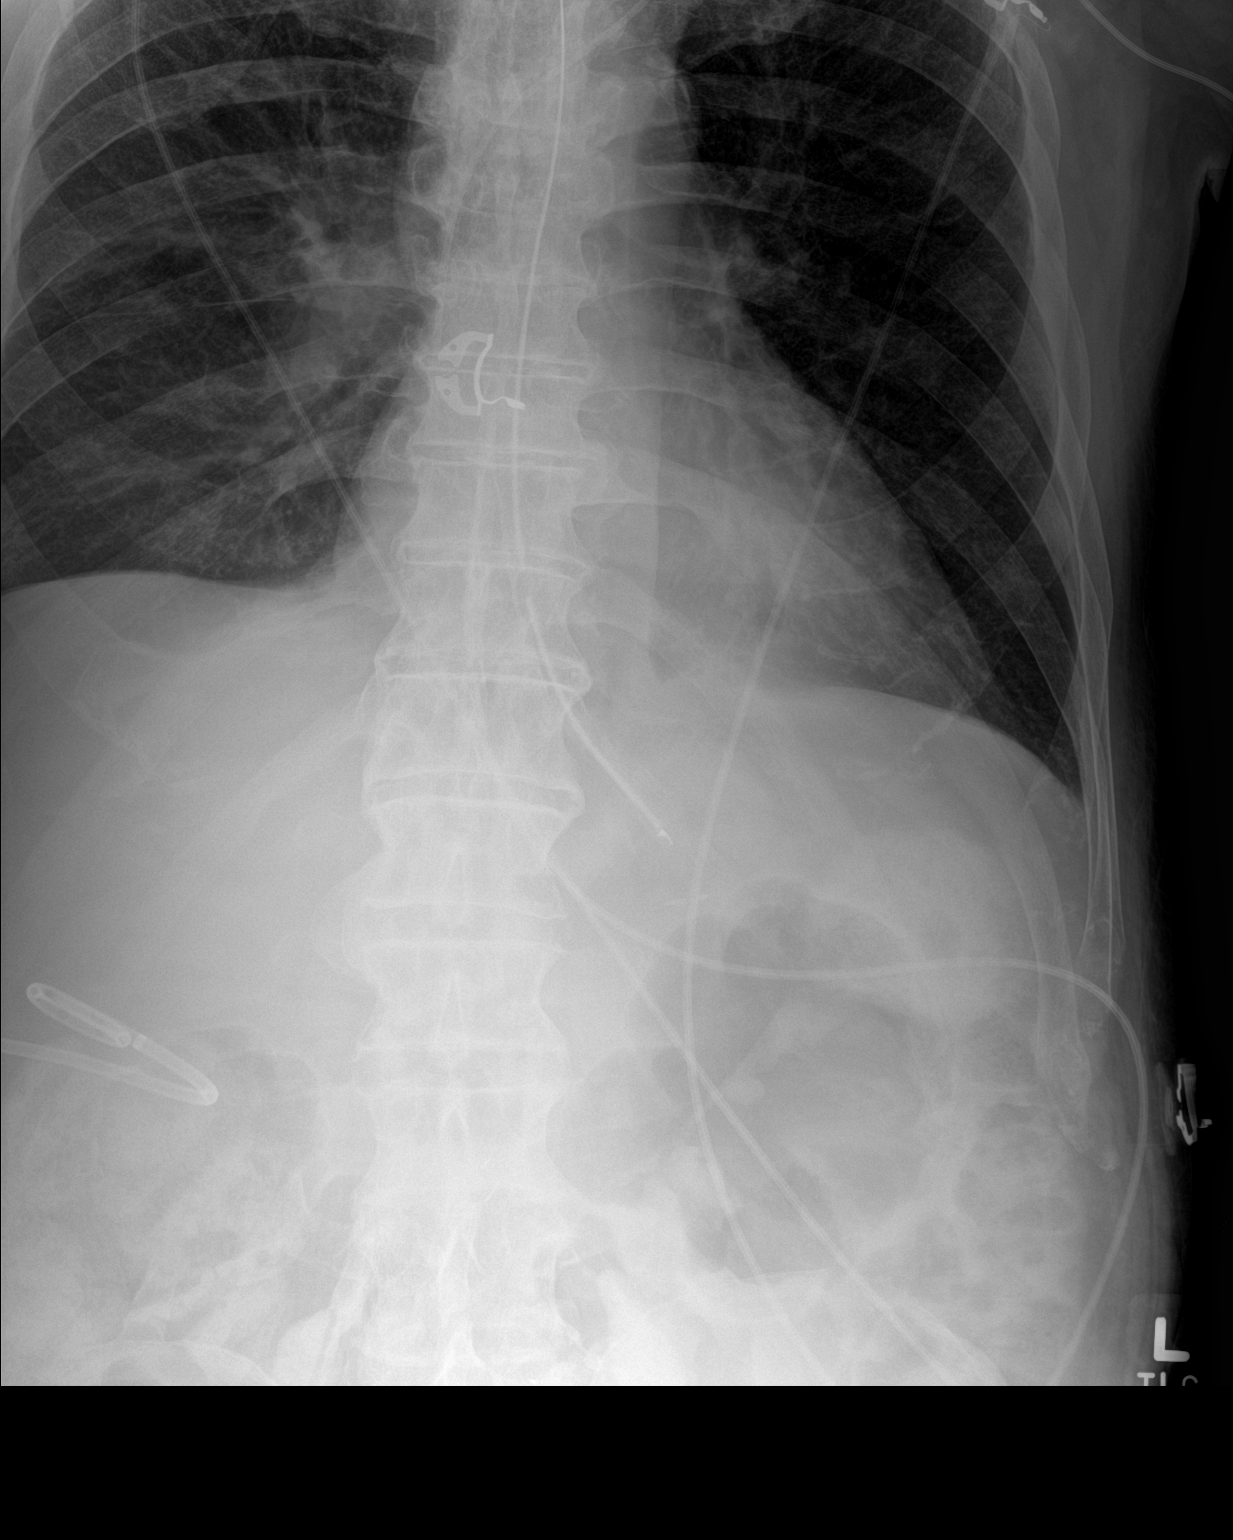

[1 of 1 positions shown; findings below may reference images not displayed]

FINDINGS: Scattered large and small bowel gas is noted. Gastric catheter is
stable in appearance with the proximal side port within the distal
esophagus. The tip is just beyond the gastroesophageal junction. No
significant changes noted. If there has been interval advancement of
the catheter films over the chest and neck should be obtained to
assess for coiling within the pharynx.
IMPRESSION: No change in nasogastric catheter position as described above.

## 2020-10-01 DIAGNOSIS — D62 Acute posthemorrhagic anemia: Secondary | ICD-10-CM | POA: Diagnosis not present

## 2020-10-01 DIAGNOSIS — E785 Hyperlipidemia, unspecified: Secondary | ICD-10-CM | POA: Diagnosis not present

## 2020-10-01 DIAGNOSIS — I129 Hypertensive chronic kidney disease with stage 1 through stage 4 chronic kidney disease, or unspecified chronic kidney disease: Secondary | ICD-10-CM | POA: Diagnosis not present

## 2020-10-01 DIAGNOSIS — E1151 Type 2 diabetes mellitus with diabetic peripheral angiopathy without gangrene: Secondary | ICD-10-CM | POA: Diagnosis not present

## 2020-10-01 DIAGNOSIS — I739 Peripheral vascular disease, unspecified: Secondary | ICD-10-CM | POA: Diagnosis not present

## 2020-10-01 DIAGNOSIS — M4810 Ankylosing hyperostosis [Forestier], site unspecified: Secondary | ICD-10-CM | POA: Diagnosis not present

## 2020-10-01 DIAGNOSIS — K5730 Diverticulosis of large intestine without perforation or abscess without bleeding: Secondary | ICD-10-CM | POA: Diagnosis not present

## 2020-10-01 DIAGNOSIS — N1831 Chronic kidney disease, stage 3a: Secondary | ICD-10-CM | POA: Diagnosis not present

## 2020-10-01 DIAGNOSIS — H3530 Unspecified macular degeneration: Secondary | ICD-10-CM | POA: Diagnosis not present

## 2020-10-01 DIAGNOSIS — I2510 Atherosclerotic heart disease of native coronary artery without angina pectoris: Secondary | ICD-10-CM | POA: Diagnosis not present

## 2020-10-02 ENCOUNTER — Encounter (INDEPENDENT_AMBULATORY_CARE_PROVIDER_SITE_OTHER): Payer: Self-pay | Admitting: Ophthalmology

## 2020-10-02 ENCOUNTER — Ambulatory Visit (INDEPENDENT_AMBULATORY_CARE_PROVIDER_SITE_OTHER): Payer: Medicare Other | Admitting: Ophthalmology

## 2020-10-02 ENCOUNTER — Other Ambulatory Visit: Payer: Self-pay

## 2020-10-02 DIAGNOSIS — H353222 Exudative age-related macular degeneration, left eye, with inactive choroidal neovascularization: Secondary | ICD-10-CM

## 2020-10-02 DIAGNOSIS — H353124 Nonexudative age-related macular degeneration, left eye, advanced atrophic with subfoveal involvement: Secondary | ICD-10-CM | POA: Diagnosis not present

## 2020-10-02 DIAGNOSIS — H353212 Exudative age-related macular degeneration, right eye, with inactive choroidal neovascularization: Secondary | ICD-10-CM

## 2020-10-02 NOTE — Assessment & Plan Note (Signed)
Notably patient is legally blind

## 2020-10-02 NOTE — Progress Notes (Addendum)
10/02/2020     CHIEF COMPLAINT Patient presents for Retina Follow Up (6 Month f\u OU. OCT/Pt states vision is not good. Pt has not had any noticeable changes in vision. Denies new floaters and FOL./BGL: 131 yesterday/A1C: 6.8 yesterday)   HISTORY OF PRESENT ILLNESS: Francisca Harbuck is a 80 y.o. male who presents to the clinic today for:   HPI    Retina Follow Up    Patient presents with  Wet AMD.  In both eyes.  Severity is moderate.  Duration of 6 months.  Since onset it is stable.  I, the attending physician,  performed the HPI with the patient and updated documentation appropriately. Additional comments: 6 Month f\u OU. OCT Pt states vision is not good. Pt has not had any noticeable changes in vision. Denies new floaters and FOL. BGL: 131 yesterday A1C: 6.8 yesterday       Last edited by Tilda Franco on 10/02/2020  9:14 AM. (History)      Referring physician: Prince Solian, MD [place],  [place] [address]  HISTORICAL INFORMATION:   Selected notes from the [facility]    Lab Results  Component Value Date   HGBA1C 5.9 (H) 01/20/2019     CURRENT MEDICATIONS: No current outpatient medications on file. (Ophthalmic Drugs)   No current facility-administered medications for this visit. (Ophthalmic Drugs)   Current Outpatient Medications (Other)  Medication Sig  . clopidogrel (PLAVIX) 75 MG tablet Take 1 tablet (75 mg total) by mouth daily.  [name] lisinopril (ZESTRIL) 5 MG tablet Take 5 mg by mouth 2 (two) times daily.  . metFORMIN (GLUCOPHAGE) 1000 MG tablet Take 1,000 mg by mouth 2 (two) times daily.  . pantoprazole (PROTONIX) 40 MG tablet TAKE 1 TABLET TWICE A DAY  . rosuvastatin (CRESTOR) 20 MG tablet Take 20 mg by mouth every evening.   . TOPROL XL 25 MG 24 hr tablet    No current facility-administered medications for this visit. (Other)      REVIEW OF SYSTEMS: ROS    Positive for: Endocrine   Last edited by Tilda Franco on 10/02/2020   9:14 AM. (History)       ALLERGIES Allergies  Allergen Reactions  . Asa [Aspirin] Other (See Comments)    GI bleeding  . Gadolinium Derivatives Itching, Swelling and Other (See Comments)    Pt complained of face flushing/hottness and throat tightness/scratchiness immediately after the injections.  Within 4 minutes, all symptoms were gone and the study was completed.  No further complications or signs of allergy were exhibited after completion of study.   . Oxycodone-Acetaminophen Other (See Comments)    Dizziness and feeling of being uncomfortable     PAST MEDICAL HISTORY Past Medical History:  Diagnosis Date  . Anemia   . Complication of anesthesia   . Diabetes mellitus ([place])    TYPE 2  . GERD (gastroesophageal reflux disease)   . History of blood transfusion    GI bleed  . History of colon polyps   . History of hiatal hernia   . Hyperlipemia   . Hypertension   . Osteoarthritis   . PAD (peripheral artery disease) (HCC)    a. stenting of his left common iliac artery >20 years ago. b. h/o LEIA stent and 2 stents to R SFA in 2011. c. 04/2014:  s/p PTA of right SFA for in-stent restenosis, occluded left SFA  . PONV (postoperative nausea and vomiting)    no porblem with the  last 3 surgeries  . RBBB (right bundle branch block with left anterior fascicular block)    NUCLEAR STRESS TEST, 08/18/2010 - no significant wall motion abnoramlities noted, post-stress EF 69%, normal myocardial perfusion study  . Sinus tachycardia    a. Noted during admission 04/2014 but upon review seems to be frequent finding for patient.  . Stenosis of carotid artery    a. 50% right carotid stenosis by angiogram 04/2014.   Past Surgical History:  Procedure Laterality Date  . ABDOMINAL AORTOGRAM W/LOWER EXTREMITY N/A 01/27/2017   Procedure: Abdominal Aortogram w/Lower Extremity;  Surgeon: Serafina Mitchell, MD;  Location: [facility];  Service: Cardiovascular;  Laterality: N/A;  . ABDOMINAL  AORTOGRAM W/LOWER EXTREMITY N/A 06/08/2017   Procedure: ABDOMINAL AORTOGRAM W/LOWER EXTREMITY;  Surgeon: Serafina Mitchell, MD;  Location: [facility];  Service: Cardiovascular;  Laterality: N/A;  . ABDOMINAL AORTOGRAM W/LOWER EXTREMITY N/A 08/31/2017   Procedure: ABDOMINAL AORTOGRAM W/LOWER EXTREMITY;  Surgeon: Serafina Mitchell, MD;  Location: [facility];  Service: Cardiovascular;  Laterality: N/A;  rt. unilateral  . ANGIOPLASTY / STENTING FEMORAL    . ANGIOPLASTY / STENTING ILIAC    . AORTA - BILATERAL FEMORAL ARTERY BYPASS GRAFT N/A 07/06/2018   Procedure: AORTA BIFEMORAL BYPASS GRAFT USING 14X7MM X 40CM HEMASHIELD GOLD GRAFT;  Surgeon: Serafina Mitchell, MD;  Location: [facility];  Service: Vascular;  Laterality: N/A;  . CEREBRAL ANGIOGRAM N/A 05/14/2014   Procedure: CEREBRAL ANGIOGRAM;  Surgeon: Lorretta Harp, MD;  Location: [facility];  Service: Cardiovascular;  Laterality: N/A;  . COLONOSCOPY W/ POLYPECTOMY    . ENDARTERECTOMY Right 09/08/2017  . ENDARTERECTOMY FEMORAL Right 09/08/2017   Procedure: REDO RIGHT FEMORAL ENDARTECTOMY WITH PATCH ANGIOPLASTY.;  Surgeon: Serafina Mitchell, MD;  Location: [facility];  Service: Vascular;  Laterality: Right;  . EYE SURGERY Bilateral    cataract  . FEMORAL ARTERY STENT Right 05/12/2010   Stented distally with a 6x100 Abbott absolute stent and proximally with a 6x60 Cook Zilver stent resulting in the reduction of the proximal segment 80% and mid segment 60-70% to 0% residual, LEFT common femoral artery stented with a 7x3 Smart stent resulting in reduction of 90% stenosis to 0% residual  . FEMORAL-POPLITEAL BYPASS GRAFT Right 09/18/2016   Procedure: BYPASS GRAFT FEMORAL-POPLITEAL ARTERY;  Surgeon: Serafina Mitchell, MD;  Location: [facility];  Service: Vascular;  Laterality: Right;  . FEMORAL-POPLITEAL BYPASS GRAFT Right 09/08/2017   Procedure: REDO BYPASS GRAFT FEMORAL-POPLITEAL ARTERY;  Surgeon: Serafina Mitchell, MD;  Location: [facility];  Service: Vascular;   Laterality: Right;  . FEMORAL-POPLITEAL BYPASS GRAFT Right 07/06/2018   Procedure: REVISION RIGHT FEMORAL TO POPLITEAL ARTERY BYPASS GRAFT;  Surgeon: Serafina Mitchell, MD;  Location: [facility];  Service: Vascular;  Laterality: Right;  . IR CHOLANGIOGRAM EXISTING TUBE  08/04/2018  . IR EXCHANGE BILIARY DRAIN  01/22/2019  . IR PERC CHOLECYSTOSTOMY  07/19/2018  . IR RADIOLOGIST EVAL & MGMT  08/31/2018  . IR RADIOLOGIST EVAL & MGMT  01/17/2019  . IR RADIOLOGIST EVAL & MGMT  02/01/2019  . IR RADIOLOGIST EVAL & MGMT  02/08/2019  . KNEE ARTHROSCOPY     left  . LOWER EXTREMITY ANGIOGRAM N/A 05/14/2014   Procedure: LOWER EXTREMITY ANGIOGRAM;  Surgeon: Lorretta Harp, MD;  Location: [facility] LAB;  Service: Cardiovascular;  Laterality: N/A;  . LOWER EXTREMITY ANGIOGRAPHY N/A 09/21/2016   Procedure: Lower Extremity Angiography;  Surgeon: Conrad [place], MD;  Location:  [facility];  Service: Cardiovascular;  Laterality: N/A;  . PERIPHERAL VASCULAR ATHERECTOMY Right 01/27/2017   Procedure: PERIPHERAL VASCULAR ATHERECTOMY;  Surgeon: Serafina Mitchell, MD;  Location: [facility];  Service: Cardiovascular;  Laterality: Right;  . PERIPHERAL VASCULAR BALLOON ANGIOPLASTY Right 06/08/2017   Procedure: PERIPHERAL VASCULAR BALLOON ANGIOPLASTY;  Surgeon: Serafina Mitchell, MD;  Location: [facility];  Service: Cardiovascular;  Laterality: Right;  common femoral and superficial femoral arteries  . PERIPHERAL VASCULAR CATHETERIZATION N/A 04/20/2016   Procedure: Lower Extremity Intervention;  Surgeon: Lorretta Harp, MD;  Location: [facility];  Service: Cardiovascular;  Laterality: N/A;  . PERIPHERAL VASCULAR INTERVENTION  08/31/2017   Procedure: PERIPHERAL VASCULAR INTERVENTION;  Surgeon: Serafina Mitchell, MD;  Location: [facility];  Service: Cardiovascular;;  REIA  . REVERSE SHOULDER ARTHROPLASTY Right 12/07/2016  . REVERSE SHOULDER ARTHROPLASTY Right 12/07/2016   Procedure: REVERSE SHOULDER  ARTHROPLASTY;  Surgeon: Netta Cedars, MD;  Location: [facility];  Service: Orthopedics;  Laterality: Right;  . ROTATOR CUFF REPAIR Right 2003  . SFA Right 05/14/2014   PTA  OF RT SFA         DR BERRY    FAMILY HISTORY Family History  Problem Relation Age of Onset  . Heart disease Mother   . Leukemia Mother   . Stomach cancer Father   . Esophageal cancer Brother   . Liver disease Brother   . Alcoholism Brother   . Leukemia Brother   . Leukemia Brother   . Diabetes type II Brother   . Lung disease Sister     SOCIAL HISTORY Social History   Tobacco Use  . Smoking status: Former Smoker    Years: 25.00    Quit date: 06/1988    Years since quitting: 32.2  . Smokeless tobacco: Never Used  Vaping Use  . Vaping Use: Never used  Substance Use Topics  . Alcohol use: Yes    Comment: rarely  . Drug use: No         OPHTHALMIC EXAM:  Base Eye Exam    Visual Acuity (Snellen - Linear)      Right Left   Dist cc 20/100 CF @ 2'   Dist ph cc NI NI   Correction: Glasses       Tonometry (Tonopen, 9:18 AM)      Right Left   Pressure 13 16       Pupils      Pupils Dark Light Shape React APD   Right PERRL 3 2 Round Brisk None   Left PERRL 3 2 Irregular Brisk None       Visual Fields (Counting fingers)      Left Right    Full Full       Neuro/Psych    Oriented x3: Yes   Mood/Affect: Normal       Dilation    Both eyes: 1.0% Mydriacyl, 2.5% Phenylephrine @ 9:18 AM        Slit Lamp and Fundus Exam    External Exam      Right Left   External Normal Normal       Slit Lamp Exam      Right Left   Lids/Lashes Normal Normal   Conjunctiva/Sclera White and quiet White and quiet   Cornea Clear Clear   Anterior Chamber Deep and quiet Deep and quiet   Iris Round and reactive Round and reactive   Lens Centered posterior chamber intraocular  lens Decentered posterior chamber intraocular lens, stable over time with dense capsule and poor zonular support   Anterior Vitreous  Normal Normal       Fundus Exam      Right Left   Posterior Vitreous Posterior vitreous detachment Posterior vitreous detachment   Disc Normal Normal   C/D Ratio 0.1 0.1   Macula Geographic atrophy, disciform scar  Large Disciform scar, subfoveal subretinal scarring fibrosis   Vessels Normal Normal   Periphery Normal Normal          IMAGING AND PROCEDURES  Imaging and Procedures for 10/02/20  OCT, Retina - OU - Both Eyes       Right Eye Quality was good. Scan locations included subfoveal. Central Foveal Thickness: 212. Progression has been stable. Findings include disciform scar, abnormal foveal contour, outer retinal atrophy.   Left Eye Quality was good. Scan locations included subfoveal. Central Foveal Thickness: 304. Progression has been stable. Findings include abnormal foveal contour, disciform scar, outer retinal atrophy.   Notes No active signs of CNVM OU, will continue to observe                ASSESSMENT/PLAN:  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) Subretinal fibrosis evident and seen in the macular region left eye and not an operable condition  Exudative age-related macular degeneration of right eye with inactive choroidal neovascularization (HCC) Centrally OD atrophic RPE change and old disciform scar no active edges  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 involvement Notably patient is legally blind      ICD-10-CM   1. 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)  H35.3222 OCT, Retina - OU - Both Eyes  2. Exudative age-related macular degeneration of right eye with inactive choroidal neovascularization (HCC)  H35.3212 OCT, Retina - OU - Both Eyes  3. 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 involvement  H35.3124     1.  No signs of active  maculopathy in either eye we will continue to monitor and observe  2.  Note patient is legally blind and can take all measures with tax purposes for notification of such  3.  Ophthalmic Meds Ordered this visit:  No orders of the defined types were placed in this encounter.      Return in about 1 year (around 10/02/2021) for DILATE OU, COLOR FP, OCT.  There are no Patient Instructions on file for this visit.   Explained the diagnoses, plan, and follow up with the patient and they expressed understanding.  Patient expressed understanding of the importance of proper follow up care.   Clent Demark Zae Kirtz M.D. Diseases & Surgery of the Retina and Vitreous [facility] [place] 10/02/20     Abbreviations: M myopia (nearsighted); A astigmatism; H hyperopia (farsighted); P presbyopia; Mrx spectacle prescription;  CTL contact lenses; OD right eye; OS left eye; OU both eyes  XT exotropia; ET esotropia; PEK punctate epithelial keratitis; PEE punctate epithelial erosions; DES dry eye syndrome; MGD meibomian gland dysfunction; ATs artificial tears; PFAT's preservative free artificial tears; [place] nuclear sclerotic cataract; PSC posterior subcapsular cataract; ERM epi-retinal membrane; PVD posterior vitreous detachment; RD retinal detachment; DM diabetes mellitus; DR diabetic retinopathy; NPDR non-proliferative diabetic retinopathy; PDR proliferative diabetic retinopathy; CSME clinically significant macular edema; DME diabetic macular edema; dbh dot blot hemorrhages; CWS cotton wool spot; POAG primary open angle glaucoma; C/D cup-to-disc ratio; HVF humphrey visual field;  GVF goldmann visual field; OCT optical coherence tomography; IOP intraocular pressure; BRVO Branch retinal vein occlusion; CRVO central retinal vein occlusion; CRAO central retinal artery occlusion; BRAO branch retinal artery occlusion; RT retinal tear; SB scleral buckle; PPV pars plana vitrectomy; VH Vitreous hemorrhage; PRP panretinal  laser photocoagulation; IVK intravitreal kenalog; VMT vitreomacular traction; MH Macular hole;  NVD neovascularization of the disc; NVE neovascularization elsewhere; AREDS age related eye disease study; ARMD age related macular degeneration; POAG primary open angle glaucoma; EBMD epithelial/anterior basement membrane dystrophy; ACIOL anterior chamber intraocular lens; IOL intraocular lens; PCIOL posterior chamber intraocular lens; Phaco/IOL phacoemulsification with intraocular lens placement; [place] photorefractive keratectomy; LASIK laser assisted in situ keratomileusis; HTN hypertension; DM diabetes mellitus; COPD chronic obstructive pulmonary disease

## 2020-10-02 NOTE — Assessment & Plan Note (Signed)
Subretinal fibrosis evident and seen in the macular region left eye and not an operable condition

## 2020-10-02 NOTE — Assessment & Plan Note (Signed)
Centrally OD atrophic RPE change and old disciform scar no active edges

## 2020-10-14 DIAGNOSIS — I739 Peripheral vascular disease, unspecified: Secondary | ICD-10-CM | POA: Diagnosis not present

## 2020-10-14 DIAGNOSIS — L602 Onychogryphosis: Secondary | ICD-10-CM | POA: Diagnosis not present

## 2020-10-21 ENCOUNTER — Ambulatory Visit (HOSPITAL_COMMUNITY)
Admission: RE | Admit: 2020-10-21 | Discharge: 2020-10-21 | Disposition: A | Discharge status: Home / Self Care | Payer: Medicare Other | Source: Ambulatory Visit | Attending: Surgery | Admitting: Surgery

## 2020-10-21 ENCOUNTER — Ambulatory Visit (INDEPENDENT_AMBULATORY_CARE_PROVIDER_SITE_OTHER): Payer: Medicare Other | Admitting: Surgery

## 2020-10-21 ENCOUNTER — Other Ambulatory Visit: Payer: Self-pay

## 2020-10-21 ENCOUNTER — Encounter: Payer: Self-pay | Admitting: Surgery

## 2020-10-21 VITALS — BP 124/63 | HR 90 | Temp 98.5°F | Resp 20 | Ht 65.0 in | Wt 152.9 lb

## 2020-10-21 DIAGNOSIS — I70213 Atherosclerosis of native arteries of extremities with intermittent claudication, bilateral legs: Secondary | ICD-10-CM | POA: Diagnosis not present

## 2020-10-21 DIAGNOSIS — I739 Peripheral vascular disease, unspecified: Secondary | ICD-10-CM | POA: Insufficient documentation

## 2020-10-21 NOTE — Progress Notes (Signed)
[facility]  Patient name: Christian Bailey MRN: [id number] DOB: [birth year] Sex: male   REASON FOR VISIT:    Follow up  HISOTRY OF PRESENT ILLNESS:    Christian Bailey a 80 y.o.malewho returns today for follow-up. He initially underwent right femoral-popliteal bypass graft with vein on 09/18/2016 for claudication. He has undergone percutaneous as well as surgical revisions of his proximal bypass. He still is having challenges with claudication. Ultimately I felt his inflow was the issue and so on 07/06/2018 he underwent an aortobifemoral bypass graft. Also revised the proximal portion of his right femoropopliteal bypass with an interposition Gore-Tex. He had a prolonged hospital course, lasting several weeks which ultimately was related to acute cholecystitis. He had a percutaneous cholecystostomy tube placed. He is back today for follow-up.  He still has challenges with walking more than about 120 steps.  He describes pain in his legs from the thigh down.  He is also having episodes of unsteadiness when he stands up.  He does not have any open wounds or rest pain.  He has really not noticed any changes in his legs.  He tries to walk as much as he can but has difficulty after about 150-200 steps.  He does not have any open wounds.  He continues to take a statin for hypercholesterolemia.  He takes Plavix as his antiplatelet therapy as he is allergic to aspirin.  He is a non-smoker but former smoker.   PAST MEDICAL HISTORY:   Past Medical History:  Diagnosis Date  . Anemia   . Complication of anesthesia   . Diabetes mellitus ([place])    TYPE 2  . GERD (gastroesophageal reflux disease)   . History of blood transfusion    GI bleed  . History of colon polyps   . History of hiatal hernia   . Hyperlipemia   . Hypertension   . Osteoarthritis   . PAD (peripheral artery disease) (HCC)    a. stenting of his left common iliac  artery >20 years ago. b. h/o LEIA stent and 2 stents to R SFA in 2011. c. 04/2014:  s/p PTA of right SFA for in-stent restenosis, occluded left SFA  . PONV (postoperative nausea and vomiting)    no porblem with the last 3 surgeries  . RBBB (right bundle branch block with left anterior fascicular block)    NUCLEAR STRESS TEST, 08/18/2010 - no significant wall motion abnoramlities noted, post-stress EF 69%, normal myocardial perfusion study  . Sinus tachycardia    a. Noted during admission 04/2014 but upon review seems to be frequent finding for patient.  . Stenosis of carotid artery    a. 50% right carotid stenosis by angiogram 04/2014.     FAMILY HISTORY:   Family History  Problem Relation Age of Onset  . Heart disease Mother   . Leukemia Mother   . Stomach cancer Father   . Esophageal cancer Brother   . Liver disease Brother   . Alcoholism Brother   . Leukemia Brother   . Leukemia Brother   . Diabetes type II Brother   . Lung disease Sister     SOCIAL HISTORY:   Social History   Tobacco Use  . Smoking status: Former Smoker    Years: 25.00    Quit date: 06/1988    Years since quitting: 32.3  . Smokeless tobacco: Never Used  Substance Use Topics  . Alcohol use: Yes    Comment: rarely  ALLERGIES:   Allergies  Allergen Reactions  . Asa [Aspirin] Other (See Comments)    GI bleeding  . Gadolinium Derivatives Itching, Swelling and Other (See Comments)    Pt complained of face flushing/hottness and throat tightness/scratchiness immediately after the injections.  Within 4 minutes, all symptoms were gone and the study was completed.  No further complications or signs of allergy were exhibited after completion of study.   . Oxycodone-Acetaminophen Other (See Comments)    Dizziness and feeling of being uncomfortable      CURRENT MEDICATIONS:   Current Outpatient Medications  Medication Sig Dispense Refill  . clopidogrel (PLAVIX) 75 MG tablet Take 1 tablet (75 mg  total) by mouth daily. 90 tablet 3  . lisinopril (ZESTRIL) 5 MG tablet Take 5 mg by mouth 2 (two) times daily.    . metFORMIN (GLUCOPHAGE) 1000 MG tablet Take 1,000 mg by mouth 2 (two) times daily.    . pantoprazole (PROTONIX) 40 MG tablet TAKE 1 TABLET TWICE A DAY 180 tablet 1  . rosuvastatin (CRESTOR) 20 MG tablet Take 20 mg by mouth every evening.     . TOPROL XL 25 MG 24 hr tablet      No current facility-administered medications for this visit.    REVIEW OF SYSTEMS:   [X]  denotes positive finding, [ ]  denotes negative finding Cardiac  Comments:  Chest pain or chest pressure:    Shortness of breath upon exertion:    Short of breath when lying flat:    Irregular heart rhythm:        Vascular    Pain in calf, thigh, or hip brought on by ambulation: x   Pain in feet at night that wakes you up from your sleep:     Blood clot in your veins:    Leg swelling:         Pulmonary    Oxygen at home:    Productive cough:     Wheezing:         Neurologic    Sudden weakness in arms or legs:     Sudden numbness in arms or legs:     Sudden onset of difficulty speaking or slurred speech:    Temporary loss of vision in one eye:     Problems with dizziness:         Gastrointestinal    Blood in stool:     Vomited blood:         Genitourinary    Burning when urinating:     Blood in urine:        Psychiatric    Major depression:         Hematologic    Bleeding problems:    Problems with blood clotting too easily:        Skin    Rashes or ulcers:        Constitutional    Fever or chills:      PHYSICAL EXAM:   Vitals:   10/21/20 1104  BP: 124/63  Pulse: 90  Resp: 20  Temp: 98.5 F (36.9 C)  SpO2: 95%  Weight: 152 lb 14.4 oz (69.4 kg)  Height: 5\' 5"  (1.651 m)    GENERAL: The patient is a well-nourished male, in no acute distress. The vital signs are documented above. CARDIAC: There is a regular rate and rhythm.  VASCULAR: Palpable femoral pulses PULMONARY:  Non-labored respirations ABDOMEN: Soft and non-tender.  No incisional hernia MUSCULOSKELETAL: There are no major deformities or  cyanosis. NEUROLOGIC: No focal weakness or paresthesias are detected. SKIN: There are no ulcers or rashes noted. PSYCHIATRIC: The patient has a normal affect.  STUDIES:   I have reviewed the following: +-------+-----------+-----------+------------+------------+  ABI/TBIToday's ABIToday's TBIPrevious ABIPrevious TBI  +-------+-----------+-----------+------------+------------+  Right 0.54    0.27    0.36    0.40      +-------+-----------+-----------+------------+------------+  Left  0.64    0.50    0.54    0.49      +-------+-----------+-----------+------------+------------+     MEDICAL ISSUES:   PAD: The patient is ABIs are improved from prior visit although his symptoms have not significantly changed.  His aortic graft remains patent.  We discussed that given what he has been through, since his symptoms have remained stable, I would not recommend intervention unless he were developed a wound.  We talked about the importance of protecting his feet and wearing shoes at all times.  I have him scheduled for follow-up in 1 year.  He will contact me sooner if he has issues  Carotid stenosis: The patient has a 60 to 70% right-sided stenosis.  This is being followed by Dr. Luiz Ochoa, MD, FACS [facility] [phone] Pager [phone]

## 2020-12-17 DIAGNOSIS — Z20822 Contact with and (suspected) exposure to covid-19: Secondary | ICD-10-CM | POA: Diagnosis not present

## 2020-12-24 DIAGNOSIS — I739 Peripheral vascular disease, unspecified: Secondary | ICD-10-CM | POA: Diagnosis not present

## 2020-12-24 DIAGNOSIS — E1151 Type 2 diabetes mellitus with diabetic peripheral angiopathy without gangrene: Secondary | ICD-10-CM | POA: Diagnosis not present

## 2020-12-24 DIAGNOSIS — L603 Nail dystrophy: Secondary | ICD-10-CM | POA: Diagnosis not present

## 2020-12-24 DIAGNOSIS — L84 Corns and callosities: Secondary | ICD-10-CM | POA: Diagnosis not present

## 2020-12-28 DIAGNOSIS — Z20822 Contact with and (suspected) exposure to covid-19: Secondary | ICD-10-CM | POA: Diagnosis not present

## 2021-02-13 DIAGNOSIS — H3530 Unspecified macular degeneration: Secondary | ICD-10-CM | POA: Diagnosis not present

## 2021-02-13 DIAGNOSIS — E785 Hyperlipidemia, unspecified: Secondary | ICD-10-CM | POA: Diagnosis not present

## 2021-02-13 DIAGNOSIS — I129 Hypertensive chronic kidney disease with stage 1 through stage 4 chronic kidney disease, or unspecified chronic kidney disease: Secondary | ICD-10-CM | POA: Diagnosis not present

## 2021-02-13 DIAGNOSIS — D62 Acute posthemorrhagic anemia: Secondary | ICD-10-CM | POA: Diagnosis not present

## 2021-02-13 DIAGNOSIS — I2510 Atherosclerotic heart disease of native coronary artery without angina pectoris: Secondary | ICD-10-CM | POA: Diagnosis not present

## 2021-02-13 DIAGNOSIS — I739 Peripheral vascular disease, unspecified: Secondary | ICD-10-CM | POA: Diagnosis not present

## 2021-02-13 DIAGNOSIS — K5730 Diverticulosis of large intestine without perforation or abscess without bleeding: Secondary | ICD-10-CM | POA: Diagnosis not present

## 2021-02-13 DIAGNOSIS — E1151 Type 2 diabetes mellitus with diabetic peripheral angiopathy without gangrene: Secondary | ICD-10-CM | POA: Diagnosis not present

## 2021-02-13 DIAGNOSIS — N1831 Chronic kidney disease, stage 3a: Secondary | ICD-10-CM | POA: Diagnosis not present

## 2021-02-13 DIAGNOSIS — M4810 Ankylosing hyperostosis [Forestier], site unspecified: Secondary | ICD-10-CM | POA: Diagnosis not present

## 2021-02-19 ENCOUNTER — Ambulatory Visit: Payer: Medicare Other | Admitting: Cardiovascular Disease

## 2021-03-27 DIAGNOSIS — Z20822 Contact with and (suspected) exposure to covid-19: Secondary | ICD-10-CM | POA: Diagnosis not present

## 2021-03-28 DIAGNOSIS — B351 Tinea unguium: Secondary | ICD-10-CM | POA: Diagnosis not present

## 2021-03-28 DIAGNOSIS — I739 Peripheral vascular disease, unspecified: Secondary | ICD-10-CM | POA: Diagnosis not present

## 2021-03-28 DIAGNOSIS — E1142 Type 2 diabetes mellitus with diabetic polyneuropathy: Secondary | ICD-10-CM | POA: Diagnosis not present

## 2021-03-28 DIAGNOSIS — L603 Nail dystrophy: Secondary | ICD-10-CM | POA: Diagnosis not present

## 2021-04-09 NOTE — Progress Notes (Signed)
Cardiology Office Note:    Date:  04/10/2021   ID:  Christian Bailey, DOB 25-May-1941, MRN [id number]  PCP:  Prince Solian, [place] Cardiologist: Quay Burow, MD   Reason for visit: 1 worse Follow-up  History of Present Illness:    Christian Bailey is a 80 y.o. male with a hx of peripheral vascular disease status post stenting of the left common iliac artery and right SFA, femoropopliteal bypass grafting by [name] 2018, right carotid stenosis, remote heavy tobacco use, hypertension, hyperlipidemia, diabetes.  He followed up with vascular surgery in April 2020.  He mentioned claudication symptoms with walking more than 120 steps with pain in his legs from the thigh down.  Dr. Tinnie Gens stated since his symptoms are stable, he would not recommend intervention unless there was active wound.  Today, the patient says he he did a walking program for 6 months.  He started with 200 paces and then eventually was able to work up to 1200 paces  He states he always felt pain beginning at 200 paces though was able to walk through the pain further as time went on.  Because he felt the pain every time he got to 200 steps, he gave up the walking program.  Now he feels like it is difficult to walk to the mailbox where he did have trouble previously.  He has symptoms that 100 steps.  He feels discomfort from the thighs down described as a ache and leg heaviness.  Also decrease in sense of balance. He has pain with standing and move around the house.  He states it does not take long to recover after he rests for 2 to 5 minutes.  He thinks his right leg may hurt a little bit more than the left.  He does see a podiatrist every 3 months for ingrown toenails.  He states he has tried Pletal in the past without improvement in symptoms.  He has been disappointed overall that PAD intervention in the past has not helped his PAD symptoms.    Past Medical History:  Diagnosis Date   Anemia     Complication of anesthesia    Diabetes mellitus ([place])    TYPE 2   GERD (gastroesophageal reflux disease)    History of blood transfusion    GI bleed   History of colon polyps    History of hiatal hernia    Hyperlipemia    Hypertension    Osteoarthritis    PAD (peripheral artery disease) ([place])    a. stenting of his left common iliac artery >20 years ago. b. h/o LEIA stent and 2 stents to R SFA in 2011. c. 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 Noted during admission 04/2014 but upon review seems to be frequent finding for patient.   Stenosis of carotid artery    a. 50% right carotid stenosis by angiogram 04/2014.    Past Surgical History:  Procedure Laterality Date   ABDOMINAL AORTOGRAM W/LOWER EXTREMITY N/A 01/27/2017   Procedure: Abdominal Aortogram w/Lower Extremity;  Surgeon: Serafina Mitchell, MD;  Location: [facility];  Service: Cardiovascular;  Laterality: N/A;   ABDOMINAL AORTOGRAM W/LOWER EXTREMITY N/A 06/08/2017   Procedure: ABDOMINAL AORTOGRAM W/LOWER EXTREMITY;  Surgeon: Serafina Mitchell, MD;  Location: [facility];  Service: Cardiovascular;  Laterality: N/A;   ABDOMINAL AORTOGRAM W/LOWER EXTREMITY N/A 08/31/2017   Procedure: ABDOMINAL AORTOGRAM W/LOWER EXTREMITY;  Surgeon: Serafina Mitchell, MD;  Location: [facility];  Service: Cardiovascular;  Laterality: N/A;  rt. unilateral   ANGIOPLASTY / STENTING FEMORAL     ANGIOPLASTY / STENTING ILIAC     AORTA - BILATERAL FEMORAL ARTERY BYPASS GRAFT N/A 07/06/2018   Procedure: AORTA BIFEMORAL BYPASS GRAFT USING 14X7MM X 40CM HEMASHIELD GOLD GRAFT;  Surgeon: Serafina Mitchell, MD;  Location: [facility];  Service: Vascular;  Laterality: N/A;   CEREBRAL ANGIOGRAM N/A 05/14/2014   Procedure: CEREBRAL ANGIOGRAM;  Surgeon: Lorretta Harp, MD;  Location: [facility] C[facility];  Service: Cardiovascular;  Laterality: N/A;   COLONOSCOPY W/ POLYPECTOMY     ENDARTERECTOMY Right 09/08/2017   ENDARTERECTOMY FEMORAL Right 09/08/2017   Procedure: REDO RIGHT FEMORAL ENDARTECTOMY WITH PATCH ANGIOPLASTY.;  Surgeon: Serafina Mitchell, MD;  Location: [facility];  Service: Vascular;  Laterality: Right;   EYE SURGERY Bilateral    cataract   FEMORAL ARTERY STENT Right 05/12/2010   Stented distally with a 6x100 Abbott absolute stent and proximally with a 6x60 Cook Zilver stent resulting in the reduction of the proximal segment 80% and mid segment 60-70% to 0% residual, LEFT common femoral artery stented with a 7x3 Smart stent resulting in reduction of 90% stenosis to 0% residual   FEMORAL-POPLITEAL BYPASS GRAFT Right 09/18/2016   Procedure: BYPASS GRAFT FEMORAL-POPLITEAL ARTERY;  Surgeon: Serafina Mitchell, MD;  Location: [facility];  Service: Vascular;  Laterality: Right;   FEMORAL-POPLITEAL BYPASS GRAFT Right 09/08/2017   Procedure: REDO BYPASS GRAFT FEMORAL-POPLITEAL ARTERY;  Surgeon: Serafina Mitchell, MD;  Location: [facility];  Service: Vascular;  Laterality: Right;   FEMORAL-POPLITEAL BYPASS GRAFT Right 07/06/2018   Procedure: REVISION RIGHT FEMORAL TO POPLITEAL ARTERY BYPASS GRAFT;  Surgeon: Serafina Mitchell, MD;  Location: [facility];  Service: Vascular;  Laterality: Right;   IR CHOLANGIOGRAM EXISTING TUBE  08/04/2018   IR EXCHANGE BILIARY DRAIN  01/22/2019   IR PERC CHOLECYSTOSTOMY  07/19/2018   IR RADIOLOGIST EVAL & MGMT  08/31/2018   IR RADIOLOGIST EVAL & MGMT  01/17/2019   IR RADIOLOGIST EVAL & MGMT  02/01/2019   IR RADIOLOGIST EVAL & MGMT  02/08/2019   KNEE ARTHROSCOPY     left   LOWER EXTREMITY ANGIOGRAM N/A 05/14/2014   Procedure: LOWER EXTREMITY ANGIOGRAM;  Surgeon: Lorretta Harp, MD;  Location: [facility] CATH LAB;   Service: Cardiovascular;  Laterality: N/A;   LOWER EXTREMITY ANGIOGRAPHY N/A 09/21/2016   Procedure: Lower Extremity Angiography;  Surgeon: Conrad Harrah, MD;  Location: [place] [facility];  Service: Cardiovascular;  Laterality: N/A;   PERIPHERAL VASCULAR ATHERECTOMY Right 01/27/2017   Procedure: PERIPHERAL VASCULAR ATHERECTOMY;  Surgeon: Serafina Mitchell, MD;  Location: [place] [facility];  Service: Cardiovascular;  Laterality: Right;   PERIPHERAL VASCULAR BALLOON ANGIOPLASTY Right 06/08/2017   Procedure: PERIPHERAL VASCULAR BALLOON ANGIOPLASTY;  Surgeon: Serafina Mitchell, MD;  Location: [place] [facility];  Service: Cardiovascular;  Laterality: Right;  common femoral and superficial femoral arteries   PERIPHERAL VASCULAR CATHETERIZATION N/A 04/20/2016   Procedure: Lower Extremity Intervention;  Surgeon: Lorretta Harp, MD;  Location: [place] [facility];  Service: Cardiovascular;  Laterality: N/A;   PERIPHERAL  VASCULAR INTERVENTION  08/31/2017   Procedure: PERIPHERAL VASCULAR INTERVENTION;  Surgeon: Serafina Mitchell, MD;  Location: [place] [facility];  Service: Cardiovascular;;  REIA   REVERSE SHOULDER ARTHROPLASTY Right 12/07/2016   REVERSE SHOULDER ARTHROPLASTY Right 12/07/2016   Procedure: REVERSE SHOULDER ARTHROPLASTY;  Surgeon: Netta Cedars, MD;  Location: [place];  Service: Orthopedics;  Laterality: Right;   ROTATOR CUFF REPAIR Right 2003   SFA Right 05/14/2014   PTA  OF RT SFA         DR BERRY    Current Medications: Current Meds  Medication Sig   clopidogrel (PLAVIX) 75 MG tablet Take 1 tablet (75 mg total) by mouth daily.   lisinopril (ZESTRIL) 5 MG tablet Take 5 mg by mouth 2 (two) times daily.   metFORMIN (GLUCOPHAGE) 1000 MG tablet Take 1,000 mg by mouth 2 (two) times daily.   pantoprazole (PROTONIX) 40 MG tablet TAKE 1 TABLET TWICE A DAY   rosuvastatin (CRESTOR) 20 MG tablet Take 20 mg by mouth every evening.    TOPROL XL 25 MG 24 hr tablet      Allergies:   Asa [aspirin],  Gadolinium derivatives, and Oxycodone-acetaminophen   Social History   Socioeconomic History   Marital status: Married    Spouse name: Not on file   Number of children: 2   Years of education: Not on file   Highest education level: Not on file  Occupational History   Occupation: retired    Fish farm manager: RETIRED  Tobacco Use   Smoking status: Former    Years: 25.00    Types: Cigarettes    Quit date: 06/1988    Years since quitting: 32.8   Smokeless tobacco: Never  Vaping Use   Vaping Use: Never used  Substance and Sexual Activity   Alcohol use: Yes    Comment: rarely   Drug use: No   Sexual activity: Not on file  Other Topics Concern   Not on file  Social History Narrative   Not on file   Social Determinants of Health   Financial Resource Strain: Not on file  Food Insecurity: Not on file  Transportation Needs: Not on file  Physical Activity: Not on file  Stress: Not on file  Social Connections: Not on file     Family History: The patient's family history includes Alcoholism in his brother; Diabetes type II in his brother; Esophageal cancer in his brother; Heart disease in his mother; Leukemia in his brother, brother, and mother; Liver disease in his brother; Lung disease in his sister; Stomach cancer in his father.  ROS:   Please see the history of present illness.     EKGs/Labs/Other Studies Reviewed:    EKG:  The ekg ordered today demonstrates normal sinus rhythm, right bundle branch block, heart rate 88, PR interval 144 ms, QRS duration 136 ms.  Recent Labs: 09/04/2020: Hemoglobin 11.0; Platelets 271.0   Recent Lipid Panel Lab Results  Component Value Date/Time   TRIG 87 07/25/2018 04:17 AM    Physical Exam:    VS:  BP (!) 124/52   Pulse 88   Ht 5\' 5"  (1.651 m)   Wt 156 lb (70.8 kg)   SpO2 99%   BMI 25.96 kg/m    No data found.  Wt Readings from Last 3 Encounters:  04/10/21 156 lb (70.8 kg)  10/21/20 152 lb 14.4 oz (69.4 kg)  09/04/20 157 lb  (71.2 kg)     GEN:  Well nourished, well developed in no  acute distress HEENT: Normal NECK: No JVD; + right carotid bruit CARDIAC: RRR, no murmurs, rubs, gallops RESPIRATORY:  Clear to auscultation without rales, wheezing or rhonchi  ABDOMEN: Soft, non-tender, non-distended MUSCULOSKELETAL: No edema SKIN: Warm and dry, decreased pedal pulses.  No open wounds or discoloration. NEUROLOGIC:  Alert and oriented PSYCHIATRIC:  Normal affect     ASSESSMENT AND PLAN   PAD without acute limb ischemia -s/p left external iliac artery stenting as well as right SFA stenting remotely.  His last angiogram was performed 04/20/2016.  He did have he did undergo right femoropopliteal bypass grafting by [name] 09/18/2016 with revision of inflow and ultimately underwent aortobifemoral bypass grafting without improvement in his claudication symptoms. -ABIs 09/2020: Right ABIs appear increased. Left ABIs appear essentially unchanged compared to prior study on 10/02/2019. -Symptoms worse after stopping his walking therapy 9 months ago. -Recommend restarting walking therapy, given sample walking plan -Continue Plavix.  Patient is allergic to aspirin. -Continue statin. -Defer on intervention unless evidence of active wounds/limb ischemia.    Carotid artery disease, asymptomatic -Carotid duplex 05/2020: Velocities in the right ICA are consistent with a 60-79% stenosis - stable compared to 2020.   -No TIA/CVA symptoms.  Given information sheet on CVA symptoms. -Recheck carotid duplex in December  Hypertension, well controlled -Continue current medications.  -Goal BP is <130/80.  Recommend DASH diet (high in vegetables, fruits, low-fat dairy products, whole grains, poultry, fish, and nuts and low in sweets, sugar-sweetened beverages, and red meats), salt restriction and increase physical activity.  Hyperlipidemia -LDL 67 in 04/2020.  Continue Crestor. -Discussed cholesterol lowering diets - Mediterranean  diet, DASH diet, vegetarian diet, low-carbohydrate diet and avoidance of trans fats.  Discussed healthier choice substitutes.  Nuts, high-fiber foods, and fiber supplements may also improve lipids.    Disposition - Carotid duplex in December.  Follow-up in 1 year with Dr. Gwenlyn Found unless active wounds/ischemia.        Medication Adjustments/Labs and Tests Ordered: Current medicines are reviewed at length with the patient today.  Concerns regarding medicines are outlined above.  Orders Placed This Encounter  Procedures   EKG 12-Lead   No orders of the defined types were placed in this encounter.   Patient Instructions  Medication Instructions:  No Changes *If you need a refill on your cardiac medications before your next appointment, please call your pharmacy*   Lab Work: No Labs If you have labs (blood work) drawn today and your tests are completely normal, you will receive your results only by: [place] (if you have MyChart) OR A paper copy in the mail If you have any lab test that is abnormal or we need to change your treatment, we will call you to review the results.   Testing/Procedures: No Testing   Follow-Up: At [facility], you and your health needs are our priority.  As part of our continuing mission to provide you with exceptional heart care, we have created designated Provider Care Teams.  These Care Teams include your primary Cardiologist (physician) and Advanced Practice Providers (APPs -  Physician Assistants and Nurse Practitioners) who all work together to provide you with the care you need, when you need it.  We recommend signing up for the patient portal called "MyChart".  Sign up information is provided on this After Visit Summary.  MyChart is used to connect with patients for Virtual Visits (Telemedicine).  Patients are able to view lab/test results, encounter notes, upcoming appointments, etc.  Non-urgent messages can be  sent to your provider as well.    To learn more about what you can do with MyChart, go to [email or web address].    Your next appointment:   1 year(s)  The format for your next appointment:   In Person  Provider:   Quay Burow, MD       Signed, Warren Lacy, PA-C  04/10/2021 12:13 PM    [facility]

## 2021-04-10 ENCOUNTER — Other Ambulatory Visit: Payer: Self-pay

## 2021-04-10 ENCOUNTER — Ambulatory Visit (INDEPENDENT_AMBULATORY_CARE_PROVIDER_SITE_OTHER): Payer: Medicare Other | Admitting: Physician Assistant

## 2021-04-10 VITALS — BP 124/52 | HR 88 | Ht 65.0 in | Wt 156.0 lb

## 2021-04-10 DIAGNOSIS — E782 Mixed hyperlipidemia: Secondary | ICD-10-CM | POA: Diagnosis not present

## 2021-04-10 DIAGNOSIS — I739 Peripheral vascular disease, unspecified: Secondary | ICD-10-CM | POA: Diagnosis not present

## 2021-04-10 DIAGNOSIS — I10 Essential (primary) hypertension: Secondary | ICD-10-CM

## 2021-04-10 DIAGNOSIS — I6523 Occlusion and stenosis of bilateral carotid arteries: Secondary | ICD-10-CM

## 2021-04-10 NOTE — Patient Instructions (Signed)
Medication Instructions:  No Changes *If you need a refill on your cardiac medications before your next appointment, please call your pharmacy*   Lab Work: No Labs If you have labs (blood work) drawn today and your tests are completely normal, you will receive your results only by: [place] (if you have MyChart) OR A paper copy in the mail If you have any lab test that is abnormal or we need to change your treatment, we will call you to review the results.   Testing/Procedures: No Testing   Follow-Up: At [facility], you and your health needs are our priority.  As part of our continuing mission to provide you with exceptional heart care, we have created designated Provider Care Teams.  These Care Teams include your primary Cardiologist (physician) and Advanced Practice Providers (APPs -  Physician Assistants and Nurse Practitioners) who all work together to provide you with the care you need, when you need it.  We recommend signing up for the patient portal called "MyChart".  Sign up information is provided on this After Visit Summary.  MyChart is used to connect with patients for Virtual Visits (Telemedicine).  Patients are able to view lab/test results, encounter notes, upcoming appointments, etc.  Non-urgent messages can be sent to your provider as well.   To learn more about what you can do with MyChart, go to [email or web address].    Your next appointment:   1 year(s)  The format for your next appointment:   In Person  Provider:   Quay Burow, MD

## 2021-04-11 ENCOUNTER — Other Ambulatory Visit: Payer: Self-pay | Admitting: Cardiovascular Disease

## 2021-04-14 ENCOUNTER — Telehealth: Payer: Self-pay | Admitting: Physician Assistant

## 2021-04-14 NOTE — Telephone Encounter (Signed)
Returned call to patient who states he forgot to ask Caron Presume, PA at the office visit on 10/13 if a higher dose of Plavix would help his symptom of leg pain. I advised that a higher dose of Plavix is not recommended. He verbalized understanding and thanked me for the call.

## 2021-04-14 NOTE — Telephone Encounter (Signed)
Patient was calling with question about his visit on 10/13. Please advise

## 2021-05-15 DIAGNOSIS — Z23 Encounter for immunization: Secondary | ICD-10-CM | POA: Diagnosis not present

## 2021-05-16 ENCOUNTER — Other Ambulatory Visit: Payer: Self-pay | Admitting: Cardiovascular Disease

## 2021-05-16 ENCOUNTER — Telehealth: Payer: Self-pay | Admitting: Cardiovascular Disease

## 2021-05-16 MED ORDER — PANTOPRAZOLE SODIUM 40 MG PO TBEC: 40.0000 mg | DELAYED_RELEASE_TABLET | Freq: Two times a day (BID) | ORAL | 0 refills | Status: DC

## 2021-05-16 NOTE — Telephone Encounter (Signed)
Patient calling back. He wants to make sure he gets his refill today and was frustrated that he called this morning and it has not yet been sent.

## 2021-05-16 NOTE — Telephone Encounter (Signed)
*  STAT* If patient is at the pharmacy, call can be transferred to refill team.   1. Which medications need to be refilled? (please list name of each medication and dose if known) pantoprazole (PROTONIX) 40 MG tablet  2. Which pharmacy/location (including street and city if local pharmacy) is medication to be sent to? [facility] [id number] - [place], [place]  3. Do they need a 30 day or 90 day supply? Pt is requesting a short term supply [facility]

## 2021-05-16 NOTE — Telephone Encounter (Signed)
Spoke with patient about refill. Explained I just sent in Rx. He is upset that he called in this morning and has not gotten refill done. Explained that we request folks allow 24-48 hours for refill requests to be processed, of which he was not aware. He verbalized understanding and is appreciative of the refill notice. No additional assistance needed

## 2021-06-13 DIAGNOSIS — Z23 Encounter for immunization: Secondary | ICD-10-CM | POA: Diagnosis not present

## 2021-06-16 DIAGNOSIS — Z20822 Contact with and (suspected) exposure to covid-19: Secondary | ICD-10-CM | POA: Diagnosis not present

## 2021-06-19 ENCOUNTER — Telehealth: Payer: Self-pay | Admitting: Cardiovascular Disease

## 2021-06-19 ENCOUNTER — Other Ambulatory Visit: Payer: Self-pay

## 2021-06-19 MED ORDER — PANTOPRAZOLE SODIUM 40 MG PO TBEC: 40.0000 mg | DELAYED_RELEASE_TABLET | Freq: Two times a day (BID) | ORAL | 3 refills | Status: DC

## 2021-06-19 MED ORDER — PANTOPRAZOLE SODIUM 40 MG PO TBEC: 40.0000 mg | DELAYED_RELEASE_TABLET | Freq: Two times a day (BID) | ORAL | 0 refills | Status: DC

## 2021-06-19 NOTE — Telephone Encounter (Signed)
° ° °*  STAT* If patient is at the pharmacy, call can be transferred to refill team.   1. Which medications need to be refilled? (please list name of each medication and dose if known) pantoprazole (PROTONIX) 40 MG tablet  2. Which pharmacy/location (including street and city if local pharmacy) is medication to be sent to? [facility] [id number] - [place], [place]  3. Do they need a 30 day or 90 day supply? 30 days   Pt is out of meds, needs 30 days supply sent to local pharmacy then 60 days supply sent to [place], [place]  Pt is upset and requesting to be done asap, he said 60 days should have sent to [place], [place] last month but it was not sent out

## 2021-06-19 NOTE — Telephone Encounter (Signed)
Spoke to patient and informed him that I sent refills for Pantoprazole (Protonix) 40 MG to [facility] and a 7 day supply to [facility].

## 2021-06-25 DIAGNOSIS — E1151 Type 2 diabetes mellitus with diabetic peripheral angiopathy without gangrene: Secondary | ICD-10-CM | POA: Diagnosis not present

## 2021-06-25 DIAGNOSIS — Z125 Encounter for screening for malignant neoplasm of prostate: Secondary | ICD-10-CM | POA: Diagnosis not present

## 2021-06-25 DIAGNOSIS — D649 Anemia, unspecified: Secondary | ICD-10-CM | POA: Diagnosis not present

## 2021-06-25 DIAGNOSIS — E785 Hyperlipidemia, unspecified: Secondary | ICD-10-CM | POA: Diagnosis not present

## 2021-06-26 DIAGNOSIS — E785 Hyperlipidemia, unspecified: Secondary | ICD-10-CM | POA: Diagnosis not present

## 2021-06-26 DIAGNOSIS — E1151 Type 2 diabetes mellitus with diabetic peripheral angiopathy without gangrene: Secondary | ICD-10-CM | POA: Diagnosis not present

## 2021-06-27 DIAGNOSIS — I739 Peripheral vascular disease, unspecified: Secondary | ICD-10-CM | POA: Diagnosis not present

## 2021-06-27 DIAGNOSIS — E1142 Type 2 diabetes mellitus with diabetic polyneuropathy: Secondary | ICD-10-CM | POA: Diagnosis not present

## 2021-06-27 DIAGNOSIS — L603 Nail dystrophy: Secondary | ICD-10-CM | POA: Diagnosis not present

## 2021-06-27 DIAGNOSIS — B351 Tinea unguium: Secondary | ICD-10-CM | POA: Diagnosis not present

## 2021-07-10 DIAGNOSIS — E785 Hyperlipidemia, unspecified: Secondary | ICD-10-CM | POA: Diagnosis not present

## 2021-07-10 DIAGNOSIS — Z1212 Encounter for screening for malignant neoplasm of rectum: Secondary | ICD-10-CM | POA: Diagnosis not present

## 2021-07-10 DIAGNOSIS — R82998 Other abnormal findings in urine: Secondary | ICD-10-CM | POA: Diagnosis not present

## 2021-07-10 DIAGNOSIS — K5730 Diverticulosis of large intestine without perforation or abscess without bleeding: Secondary | ICD-10-CM | POA: Diagnosis not present

## 2021-07-10 DIAGNOSIS — H3530 Unspecified macular degeneration: Secondary | ICD-10-CM | POA: Diagnosis not present

## 2021-07-10 DIAGNOSIS — I739 Peripheral vascular disease, unspecified: Secondary | ICD-10-CM | POA: Diagnosis not present

## 2021-07-10 DIAGNOSIS — I2510 Atherosclerotic heart disease of native coronary artery without angina pectoris: Secondary | ICD-10-CM | POA: Diagnosis not present

## 2021-07-10 DIAGNOSIS — E1151 Type 2 diabetes mellitus with diabetic peripheral angiopathy without gangrene: Secondary | ICD-10-CM | POA: Diagnosis not present

## 2021-07-10 DIAGNOSIS — D62 Acute posthemorrhagic anemia: Secondary | ICD-10-CM | POA: Diagnosis not present

## 2021-07-10 DIAGNOSIS — Z0000 Encounter for general adult medical examination without abnormal findings: Secondary | ICD-10-CM | POA: Diagnosis not present

## 2021-07-10 DIAGNOSIS — I129 Hypertensive chronic kidney disease with stage 1 through stage 4 chronic kidney disease, or unspecified chronic kidney disease: Secondary | ICD-10-CM | POA: Diagnosis not present

## 2021-07-10 DIAGNOSIS — N1831 Chronic kidney disease, stage 3a: Secondary | ICD-10-CM | POA: Diagnosis not present

## 2021-07-10 DIAGNOSIS — M4810 Ankylosing hyperostosis [Forestier], site unspecified: Secondary | ICD-10-CM | POA: Diagnosis not present

## 2021-08-31 DIAGNOSIS — Z20822 Contact with and (suspected) exposure to covid-19: Secondary | ICD-10-CM | POA: Diagnosis not present

## 2021-09-02 DIAGNOSIS — E1151 Type 2 diabetes mellitus with diabetic peripheral angiopathy without gangrene: Secondary | ICD-10-CM | POA: Diagnosis not present

## 2021-09-03 DIAGNOSIS — E1151 Type 2 diabetes mellitus with diabetic peripheral angiopathy without gangrene: Secondary | ICD-10-CM | POA: Diagnosis not present

## 2021-09-05 DIAGNOSIS — E1142 Type 2 diabetes mellitus with diabetic polyneuropathy: Secondary | ICD-10-CM | POA: Diagnosis not present

## 2021-09-05 DIAGNOSIS — B351 Tinea unguium: Secondary | ICD-10-CM | POA: Diagnosis not present

## 2021-09-05 DIAGNOSIS — I739 Peripheral vascular disease, unspecified: Secondary | ICD-10-CM | POA: Diagnosis not present

## 2021-09-05 DIAGNOSIS — L603 Nail dystrophy: Secondary | ICD-10-CM | POA: Diagnosis not present

## 2021-09-10 DIAGNOSIS — Z20822 Contact with and (suspected) exposure to covid-19: Secondary | ICD-10-CM | POA: Diagnosis not present

## 2021-09-11 DIAGNOSIS — Z20822 Contact with and (suspected) exposure to covid-19: Secondary | ICD-10-CM | POA: Diagnosis not present

## 2021-09-22 DIAGNOSIS — Z20822 Contact with and (suspected) exposure to covid-19: Secondary | ICD-10-CM | POA: Diagnosis not present

## 2021-09-27 DIAGNOSIS — Z20822 Contact with and (suspected) exposure to covid-19: Secondary | ICD-10-CM | POA: Diagnosis not present

## 2021-10-01 DIAGNOSIS — Z20822 Contact with and (suspected) exposure to covid-19: Secondary | ICD-10-CM | POA: Diagnosis not present

## 2021-10-02 ENCOUNTER — Encounter (INDEPENDENT_AMBULATORY_CARE_PROVIDER_SITE_OTHER): Payer: Self-pay | Admitting: Ophthalmology

## 2021-10-02 ENCOUNTER — Ambulatory Visit (INDEPENDENT_AMBULATORY_CARE_PROVIDER_SITE_OTHER): Payer: Medicare Other | Admitting: Ophthalmology

## 2021-10-02 DIAGNOSIS — H353114 Nonexudative age-related macular degeneration, right eye, advanced atrophic with subfoveal involvement: Secondary | ICD-10-CM

## 2021-10-02 DIAGNOSIS — Z20822 Contact with and (suspected) exposure to covid-19: Secondary | ICD-10-CM | POA: Diagnosis not present

## 2021-10-02 DIAGNOSIS — H353222 Exudative age-related macular degeneration, left eye, with inactive choroidal neovascularization: Secondary | ICD-10-CM | POA: Diagnosis not present

## 2021-10-02 DIAGNOSIS — H353124 Nonexudative age-related macular degeneration, left eye, advanced atrophic with subfoveal involvement: Secondary | ICD-10-CM

## 2021-10-02 DIAGNOSIS — H353212 Exudative age-related macular degeneration, right eye, with inactive choroidal neovascularization: Secondary | ICD-10-CM | POA: Diagnosis not present

## 2021-10-02 NOTE — Assessment & Plan Note (Signed)
Large geographic scar centrally ?

## 2021-10-02 NOTE — Assessment & Plan Note (Signed)
Stable no recurrence ?

## 2021-10-02 NOTE — Progress Notes (Signed)
? ? ?10/02/2021 ? ?  ? ?CHIEF COMPLAINT ?Patient presents for  ?Chief Complaint  ?Patient presents with  ? Macular Degeneration  ? ? ? ? ?HISTORY OF PRESENT ILLNESS: ?Christian Bailey is a 81 y.o. male who presents to the clinic today for:  ? ?HPI   ?1 yr fu ou oct. ?Pt stated having trouble looking at the icons on the computer and while driving looking through the blind spots. PT described it as Dyslexia.  ?Pt denies FOL but sees occasional floaters. ? ?Last edited by Silvestre Moment on 10/02/2021  9:57 AM.  ?  ? ? ?Referring physician: ?Prince Solian, MD ?[address] ?[place],  [place] [address] ? ?HISTORICAL INFORMATION:  ? ?Selected notes from the [facility] ?  ? ?Lab Results  ?Component Value Date  ? HGBA1C 5.9 (H) 01/20/2019  ?  ? ?CURRENT MEDICATIONS: ?No current outpatient medications on file. (Ophthalmic Drugs)  ? ?No current facility-administered medications for this visit. (Ophthalmic Drugs)  ? ?Current Outpatient Medications (Other)  ?Medication Sig  ? clopidogrel (PLAVIX) 75 MG tablet TAKE 1 TABLET DAILY  ? lisinopril (ZESTRIL) 5 MG tablet Take 5 mg by mouth 2 (two) times daily.  ? metFORMIN (GLUCOPHAGE) 1000 MG tablet Take 1,000 mg by mouth 2 (two) times daily.  ? pantoprazole (PROTONIX) 40 MG tablet Take 1 tablet (40 mg total) by mouth 2 (two) times daily.  ? rosuvastatin (CRESTOR) 20 MG tablet Take 20 mg by mouth every evening.   ? TOPROL XL 25 MG 24 hr tablet   ? ?No current facility-administered medications for this visit. (Other)  ? ? ? ? ?REVIEW OF SYSTEMS: ?ROS   ?Negative for: Constitutional, Gastrointestinal, Neurological, Skin, Genitourinary, Musculoskeletal, HENT, Endocrine, Cardiovascular, Eyes, Respiratory, Psychiatric, Allergic/Imm, Heme/Lymph ?Last edited by Silvestre Moment on 10/02/2021  9:57 AM.  ?  ? ? ? ?ALLERGIES ?Allergies  ?Allergen Reactions  ? Asa [Aspirin] Other (See Comments)  ?  GI bleeding  ? Gadolinium Derivatives Itching, Swelling and Other (See Comments)  ?  Pt complained of face  flushing/hottness and throat tightness/scratchiness immediately after the injections.  Within 4 minutes, all symptoms were gone and the study was completed.  No further complications or signs of allergy were exhibited after completion of study.   ? Oxycodone-Acetaminophen Other (See Comments)  ?  Dizziness and feeling of being uncomfortable   ? ? ?PAST MEDICAL HISTORY ?Past Medical History:  ?Diagnosis Date  ? Anemia   ? Complication of anesthesia   ? Diabetes mellitus ([place])   ? TYPE 2  ? GERD (gastroesophageal reflux disease)   ? History of blood transfusion   ? GI bleed  ? History of colon polyps   ? History of hiatal hernia   ? Hyperlipemia   ? Hypertension   ? Osteoarthritis   ? PAD (peripheral artery disease) ([place])   ? a. stenting of his left common iliac artery >20 years ago. b. h/o LEIA stent and 2 stents to R SFA in 2011. c. 04/2014:  s/p PTA of right SFA for in-stent restenosis, occluded left SFA  ? PONV (postoperative nausea and vomiting)   ? no porblem with the last 3 surgeries  ? RBBB (right bundle branch block with left anterior fascicular block)   ? NUCLEAR STRESS TEST, 08/18/2010 - no significant wall motion abnoramlities noted, post-stress EF 69%, normal myocardial perfusion study  ? Sinus tachycardia   ? a. Noted during admission 04/2014 but upon review seems to be frequent finding for patient.  ?  Stenosis of carotid artery   ? a. 50% right carotid stenosis by angiogram 04/2014.  ? ?Past Surgical History:  ?Procedure Laterality Date  ? ABDOMINAL AORTOGRAM W/LOWER EXTREMITY N/A 01/27/2017  ? Procedure: Abdominal Aortogram w/Lower Extremity;  Surgeon: Serafina Mitchell, MD;  Location: [facility];  Service: Cardiovascular;  Laterality: N/A;  ? ABDOMINAL AORTOGRAM W/LOWER EXTREMITY N/A 06/08/2017  ? Procedure: ABDOMINAL AORTOGRAM W/LOWER EXTREMITY;  Surgeon: Serafina Mitchell, MD;  Location: [facility];  Service: Cardiovascular;  Laterality: N/A;  ? ABDOMINAL AORTOGRAM W/LOWER EXTREMITY N/A  08/31/2017  ? Procedure: ABDOMINAL AORTOGRAM W/LOWER EXTREMITY;  Surgeon: Serafina Mitchell, MD;  Location: [facility];  Service: Cardiovascular;  Laterality: N/A;  rt. unilateral  ? ANGIOPLASTY / STENTING FEMORAL    ? ANGIOPLASTY / STENTING ILIAC    ? AORTA - BILATERAL FEMORAL ARTERY BYPASS GRAFT N/A 07/06/2018  ? Procedure: AORTA BIFEMORAL BYPASS GRAFT USING 14X7MM X 40CM HEMASHIELD GOLD GRAFT;  Surgeon: Serafina Mitchell, MD;  Location: [facility];  Service: Vascular;  Laterality: N/A;  ? CEREBRAL ANGIOGRAM N/A 05/14/2014  ? Procedure: CEREBRAL ANGIOGRAM;  Surgeon: Lorretta Harp, MD;  Location: [facility] CATH LAB;  Service: Cardiovascular;  Laterality: N/A;  ? COLONOSCOPY W/ POLYPECTOMY    ? ENDARTERECTOMY Right 09/08/2017  ? ENDARTERECTOMY FEMORAL Right 09/08/2017  ? Procedure: REDO RIGHT FEMORAL ENDARTECTOMY WITH PATCH ANGIOPLASTY.;  Surgeon: Serafina Mitchell, MD;  Location: [facility] OR;  Service: Vascular;  Laterality: Right;  ? EYE SURGERY Bilateral   ? cataract  ? FEMORAL ARTERY STENT Right 05/12/2010  ? Stented distally with a 6x100 Abbott absolute stent and proximally with a 6x60 Cook Zilver stent resulting in the reduction of the proximal segment 80% and mid segment 60-70% to 0% residual, LEFT common femoral artery stented with a 7x3 Smart stent resulting in reduction of 90% stenosis to 0% residual  ? FEMORAL-POPLITEAL BYPASS GRAFT Right 09/18/2016  ? Procedure: BYPASS GRAFT FEMORAL-POPLITEAL ARTERY;  Surgeon: Serafina Mitchell, MD;  [facility] OR;  Service: Vascular;  Laterality: Right;  ? FEMORAL-POPLITEAL BYPASS GRAFT Right 09/08/2017  ? Procedure: REDO BYPASS GRAFT FEMORAL-POPLITEAL ARTERY;  Surgeon: Serafina Mitchell, MD;  [facility];  Service: Vascular;  Laterality: Right;  ? FEMORAL-POPLITEAL BYPASS GRAFT Right 07/06/2018  ? Procedure: REVISION RIGHT FEMORAL TO POPLITEAL ARTERY BYPASS GRAFT;  Surgeon: Serafina Mitchell, MD;  Location: [facility] OR;  Service: Vascular;  Laterality: Right;  ? IR CHOLANGIOGRAM EXISTING  TUBE  08/04/2018  ? IR EXCHANGE BILIARY DRAIN  01/22/2019  ? IR PERC CHOLECYSTOSTOMY  07/19/2018  ? IR RADIOLOGIST EVAL & MGMT  08/31/2018  ? IR RADIOLOGIST EVAL & MGMT  01/17/2019  ? IR RADIOLOGIST EVAL & MGMT  02/01/2019  ? IR RADIOLOGIST EVAL & MGMT  02/08/2019  ? KNEE ARTHROSCOPY    ? left  ? LOWER EXTREMITY ANGIOGRAM N/A 05/14/2014  ? Procedure: LOWER EXTREMITY ANGIOGRAM;  Surgeon: Lorretta Harp, MD;  Location: [facility] CATH LAB;  Service: Cardiovascular;  Laterality: N/A;  ? LOWER EXTREMITY ANGIOGRAPHY N/A 09/21/2016  ? Procedure: Lower Extremity Angiography;  Surgeon: Conrad Ernest, MD;  Location: [facility];  Service: Cardiovascular;  Laterality: N/A;  ? PERIPHERAL VASCULAR ATHERECTOMY Right 01/27/2017  ? Procedure: PERIPHERAL VASCULAR ATHERECTOMY;  Surgeon: Serafina Mitchell, MD;  Location: [facility];  Service: Cardiovascular;  Laterality: Right;  ? PERIPHERAL VASCULAR BALLOON ANGIOPLASTY Right 06/08/2017  ? Procedure: PERIPHERAL VASCULAR BALLOON ANGIOPLASTY;  Surgeon: Harold Barban  W, MD;  Location: [facility];  Service: Cardiovascular;  Laterality: Right;  common femoral and superficial femoral arteries  ? PERIPHERAL VASCULAR CATHETERIZATION N/A 04/20/2016  ? Procedure: Lower Extremity Intervention;  Surgeon: Lorretta Harp, MD;  Location: [facility];  Service: Cardiovascular;  Laterality: N/A;  ? PERIPHERAL VASCULAR INTERVENTION  08/31/2017  ? Procedure: PERIPHERAL VASCULAR INTERVENTION;  Surgeon: Serafina Mitchell, MD;  Location: [facility];  Service: Cardiovascular;;  REIA  ? REVERSE SHOULDER ARTHROPLASTY Right 12/07/2016  ? REVERSE SHOULDER ARTHROPLASTY Right 12/07/2016  ? Procedure: REVERSE SHOULDER ARTHROPLASTY;  Surgeon: Netta Cedars, MD;  Location: [facility];  Service: Orthopedics;  Laterality: Right;  ? ROTATOR CUFF REPAIR Right 2003  ? SFA Right 05/14/2014  ? PTA  OF RT SFA         DR BERRY  ? ? ?FAMILY HISTORY ?Family History  ?Problem Relation Age of Onset  ? Heart disease  Mother   ? Leukemia Mother   ? Stomach cancer Father   ? Esophageal cancer Brother   ? Liver disease Brother   ? Alcoholism Brother   ? Leukemia Brother   ? Leukemia Brother   ? Diabetes type II Brother   ? Lung

## 2021-10-02 NOTE — Assessment & Plan Note (Signed)
Atrophy splinting FAZ ?

## 2021-10-05 DIAGNOSIS — Z20822 Contact with and (suspected) exposure to covid-19: Secondary | ICD-10-CM | POA: Diagnosis not present

## 2021-10-06 DIAGNOSIS — Z20822 Contact with and (suspected) exposure to covid-19: Secondary | ICD-10-CM | POA: Diagnosis not present

## 2021-10-08 DIAGNOSIS — Z20822 Contact with and (suspected) exposure to covid-19: Secondary | ICD-10-CM | POA: Diagnosis not present

## 2021-10-14 DIAGNOSIS — Z20822 Contact with and (suspected) exposure to covid-19: Secondary | ICD-10-CM | POA: Diagnosis not present

## 2021-10-16 ENCOUNTER — Other Ambulatory Visit: Payer: Self-pay

## 2021-10-16 DIAGNOSIS — I70213 Atherosclerosis of native arteries of extremities with intermittent claudication, bilateral legs: Secondary | ICD-10-CM

## 2021-10-22 DIAGNOSIS — R059 Cough, unspecified: Secondary | ICD-10-CM | POA: Diagnosis not present

## 2021-10-22 DIAGNOSIS — R051 Acute cough: Secondary | ICD-10-CM | POA: Diagnosis not present

## 2021-10-22 DIAGNOSIS — Z20822 Contact with and (suspected) exposure to covid-19: Secondary | ICD-10-CM | POA: Diagnosis not present

## 2021-10-27 ENCOUNTER — Encounter: Payer: Self-pay | Admitting: Surgery

## 2021-10-27 ENCOUNTER — Ambulatory Visit (HOSPITAL_COMMUNITY)
Admission: RE | Admit: 2021-10-27 | Discharge: 2021-10-27 | Disposition: A | Discharge status: Home / Self Care | Payer: Medicare Other | Source: Ambulatory Visit | Attending: Surgery | Admitting: Surgery

## 2021-10-27 ENCOUNTER — Ambulatory Visit (INDEPENDENT_AMBULATORY_CARE_PROVIDER_SITE_OTHER): Payer: Medicare Other | Admitting: Surgery

## 2021-10-27 VITALS — BP 125/64 | HR 88 | Temp 98.1°F | Resp 20 | Ht 65.0 in | Wt 150.5 lb

## 2021-10-27 DIAGNOSIS — I70213 Atherosclerosis of native arteries of extremities with intermittent claudication, bilateral legs: Secondary | ICD-10-CM

## 2021-10-27 DIAGNOSIS — I6523 Occlusion and stenosis of bilateral carotid arteries: Secondary | ICD-10-CM

## 2021-10-27 DIAGNOSIS — Z20822 Contact with and (suspected) exposure to covid-19: Secondary | ICD-10-CM | POA: Diagnosis not present

## 2021-10-27 NOTE — Progress Notes (Signed)
? ?[facility] ? ?Patient name: Christian Bailey MRN: [id number] DOB: October 19, 1940 Sex: male ? ? ?REASON FOR VISIT:  ? ? ?Follow-up ? ?HISOTRY OF PRESENT ILLNESS:  ? ?Christian Bailey is a 81 y.o. male who returns today for follow-up.  He initially underwent right femoral-popliteal bypass graft with vein on 09/18/2016 for claudication.  He has undergone percutaneous as well as surgical revisions of his proximal bypass.  He still is having challenges with claudication.  Ultimately I felt his inflow was the issue and so on 07/06/2018 he underwent an aortobifemoral bypass graft.  Also revised the proximal portion of his right femoropopliteal bypass with an interposition Gore-Tex.  He had a prolonged hospital course, lasting several weeks which ultimately was related to acute cholecystitis.  He had a percutaneous cholecystostomy tube placed.   ? ?Last year he was walking approximately 125 feet before he has symptoms.  Now he is walking approximately 100 feet.  He does not have any open wounds.  He continues to take a statin for hypercholesterolemia.  He takes Plavix as his antiplatelet therapy as he is allergic to aspirin.  He is a non-smoker but former smoker. ? ? ?PAST MEDICAL HISTORY:  ? ?Past Medical History:  ?Diagnosis Date  ? Anemia   ? Complication of anesthesia   ? Diabetes mellitus ([place])   ? TYPE 2  ? GERD (gastroesophageal reflux disease)   ? History of blood transfusion   ? GI bleed  ? History of colon polyps   ? History of hiatal hernia   ? Hyperlipemia   ? Hypertension   ? Osteoarthritis   ? PAD (peripheral artery disease) ([place])   ? a. stenting of his left common iliac artery >20 years ago. b. h/o LEIA stent and 2 stents to R SFA in 2011. c. 04/2014:  s/p PTA of right SFA for in-stent restenosis, occluded left SFA  ? PONV (postoperative nausea and vomiting)   ? no porblem with the last 3 surgeries  ? RBBB (right bundle branch block with left anterior  fascicular block)   ? NUCLEAR STRESS TEST, 08/18/2010 - no significant wall motion abnoramlities noted, post-stress EF 69%, normal myocardial perfusion study  ? Sinus tachycardia   ? a. Noted during admission 04/2014 but upon review seems to be frequent finding for patient.  ? Stenosis of carotid artery   ? a. 50% right carotid stenosis by angiogram 04/2014.  ? ? ? ?FAMILY HISTORY:  ? ?Family History  ?Problem Relation Age of Onset  ? Heart disease Mother   ? Leukemia Mother   ? Stomach cancer Father   ? Esophageal cancer Brother   ? Liver disease Brother   ? Alcoholism Brother   ? Leukemia Brother   ? Leukemia Brother   ? Diabetes type II Brother   ? Lung disease Sister   ? ? ?SOCIAL HISTORY:  ? ?Social History  ? ?Tobacco Use  ? Smoking status: Former  ?  Years: 25.00  ?  Types: Cigarettes  ?  Quit date: 06/1988  ?  Years since quitting: 33.3  ? Smokeless tobacco: Never  ?Substance Use Topics  ? Alcohol use: Yes  ?  Comment: rarely  ? ? ? ?ALLERGIES:  ? ?Allergies  ?Allergen Reactions  ? Asa [Aspirin] Other (See Comments)  ?  GI bleeding  ? Gadolinium Derivatives Itching, Swelling and Other (See Comments)  ?  Pt complained of face flushing/hottness and throat tightness/scratchiness immediately after the injections.  Within  4 minutes, all symptoms were gone and the study was completed.  No further complications or signs of allergy were exhibited after completion of study.   ? Oxycodone-Acetaminophen Other (See Comments)  ?  Dizziness and feeling of being uncomfortable   ? ? ? ?CURRENT MEDICATIONS:  ? ?Current Outpatient Medications  ?Medication Sig Dispense Refill  ? clopidogrel (PLAVIX) 75 MG tablet TAKE 1 TABLET DAILY 90 tablet 3  ? lisinopril (ZESTRIL) 5 MG tablet Take 5 mg by mouth 2 (two) times daily.    ? metFORMIN (GLUCOPHAGE) 1000 MG tablet Take 1,000 mg by mouth 2 (two) times daily.    ? pantoprazole (PROTONIX) 40 MG tablet Take 1 tablet (40 mg total) by mouth 2 (two) times daily. 14 tablet 0  ?  rosuvastatin (CRESTOR) 20 MG tablet Take 20 mg by mouth every evening.     ? TOPROL XL 25 MG 24 hr tablet     ? ?No current facility-administered medications for this visit.  ? ? ?REVIEW OF SYSTEMS:  ? ?'[X]'$  denotes positive finding, '[ ]'$  denotes negative finding ?Cardiac  Comments:  ?Chest pain or chest pressure:    ?Shortness of breath upon exertion:    ?Short of breath when lying flat:    ?Irregular heart rhythm:    ?    ?Vascular    ?Pain in calf, thigh, or hip brought on by ambulation: x   ?Pain in feet at night that wakes you up from your sleep:     ?Blood clot in your veins:    ?Leg swelling:     ?    ?Pulmonary    ?Oxygen at home:    ?Productive cough:     ?Wheezing:     ?    ?Neurologic    ?Sudden weakness in arms or legs:     ?Sudden numbness in arms or legs:     ?Sudden onset of difficulty speaking or slurred speech:    ?Temporary loss of vision in one eye:     ?Problems with dizziness:     ?    ?Gastrointestinal    ?Blood in stool:     ?Vomited blood:     ?    ?Genitourinary    ?Burning when urinating:     ?Blood in urine:    ?    ?Psychiatric    ?Major depression:     ?    ?Hematologic    ?Bleeding problems:    ?Problems with blood clotting too easily:    ?    ?Skin    ?Rashes or ulcers:    ?    ?Constitutional    ?Fever or chills:    ? ? ?PHYSICAL EXAM:  ? ?Vitals:  ? 10/27/21 1337  ?BP: 125/64  ?Pulse: 88  ?Resp: 20  ?Temp: 98.1 ?F (36.7 ?C)  ?SpO2: 95%  ?Weight: 150 lb 8 oz (68.3 kg)  ?Height: '5\' 5"'$  (1.651 m)  ? ? ?GENERAL: The patient is a well-nourished male, in no acute distress. The vital signs are documented above. ?CARDIAC: There is a regular rate and rhythm.  ?VASCULAR: Palpable femoral pulses ?PULMONARY: Non-labored respirations ?ABDOMEN: Soft and non-tender.  No incisional hernia ?MUSCULOSKELETAL: There are no major deformities or cyanosis. ?NEUROLOGIC: No focal weakness or paresthesias are detected. ?SKIN: There are no ulcers or rashes noted. ?PSYCHIATRIC: The patient has a normal  affect. ? ?STUDIES:  ? ?I have reviewed the following: ?ABI/TBIToday's ABIToday's TBIPrevious ABIPrevious TBI  ?+-------+-----------+-----------+------------+------------+  ?Right  0.57  0.22       0.54        0.27          ?+-------+-----------+-----------+------------+------------+  ?Left   0.53       0.35       0.64        0.50          ?+-------+-----------+-----------+------------+------------+  ? ?MEDICAL ISSUES:  ? ?PAD: Patient's ABIs are essentially stable.  He has noticed a slight decrease in his walking distance, however this is not terribly different from last year.  I would not recommend intervention unless he were to develop a wound.  I am and have him follow-up in 1 year ? ?Carotid: His last carotid duplex was in December 2021 which showed 60-79% right-sided stenosis.  This is being followed by Dr. Gwenlyn Found.  I have told him to reach out to him to get him scheduled for another ultrasound ? ? ? ?[name], IV, MD, FACS ?[facility] ?Tel [phone] ?Pager [phone]  ?

## 2021-10-28 DIAGNOSIS — Z20822 Contact with and (suspected) exposure to covid-19: Secondary | ICD-10-CM | POA: Diagnosis not present

## 2021-10-29 DIAGNOSIS — Z20822 Contact with and (suspected) exposure to covid-19: Secondary | ICD-10-CM | POA: Diagnosis not present

## 2021-10-30 DIAGNOSIS — Z20822 Contact with and (suspected) exposure to covid-19: Secondary | ICD-10-CM | POA: Diagnosis not present

## 2021-11-03 DIAGNOSIS — Z20822 Contact with and (suspected) exposure to covid-19: Secondary | ICD-10-CM | POA: Diagnosis not present

## 2021-11-04 DIAGNOSIS — Z20822 Contact with and (suspected) exposure to covid-19: Secondary | ICD-10-CM | POA: Diagnosis not present

## 2021-11-04 DIAGNOSIS — I2510 Atherosclerotic heart disease of native coronary artery without angina pectoris: Secondary | ICD-10-CM | POA: Diagnosis not present

## 2021-11-04 DIAGNOSIS — N1831 Chronic kidney disease, stage 3a: Secondary | ICD-10-CM | POA: Diagnosis not present

## 2021-11-04 DIAGNOSIS — M4810 Ankylosing hyperostosis [Forestier], site unspecified: Secondary | ICD-10-CM | POA: Diagnosis not present

## 2021-11-04 DIAGNOSIS — D62 Acute posthemorrhagic anemia: Secondary | ICD-10-CM | POA: Diagnosis not present

## 2021-11-04 DIAGNOSIS — I129 Hypertensive chronic kidney disease with stage 1 through stage 4 chronic kidney disease, or unspecified chronic kidney disease: Secondary | ICD-10-CM | POA: Diagnosis not present

## 2021-11-04 DIAGNOSIS — H3530 Unspecified macular degeneration: Secondary | ICD-10-CM | POA: Diagnosis not present

## 2021-11-04 DIAGNOSIS — I739 Peripheral vascular disease, unspecified: Secondary | ICD-10-CM | POA: Diagnosis not present

## 2021-11-04 DIAGNOSIS — E785 Hyperlipidemia, unspecified: Secondary | ICD-10-CM | POA: Diagnosis not present

## 2021-11-04 DIAGNOSIS — E1151 Type 2 diabetes mellitus with diabetic peripheral angiopathy without gangrene: Secondary | ICD-10-CM | POA: Diagnosis not present

## 2021-11-04 DIAGNOSIS — K5730 Diverticulosis of large intestine without perforation or abscess without bleeding: Secondary | ICD-10-CM | POA: Diagnosis not present

## 2021-11-05 DIAGNOSIS — Z20822 Contact with and (suspected) exposure to covid-19: Secondary | ICD-10-CM | POA: Diagnosis not present

## 2021-11-06 DIAGNOSIS — E1151 Type 2 diabetes mellitus with diabetic peripheral angiopathy without gangrene: Secondary | ICD-10-CM | POA: Diagnosis not present

## 2021-11-06 DIAGNOSIS — E785 Hyperlipidemia, unspecified: Secondary | ICD-10-CM | POA: Diagnosis not present

## 2021-11-06 DIAGNOSIS — R7989 Other specified abnormal findings of blood chemistry: Secondary | ICD-10-CM | POA: Diagnosis not present

## 2021-12-10 DIAGNOSIS — L603 Nail dystrophy: Secondary | ICD-10-CM | POA: Diagnosis not present

## 2021-12-10 DIAGNOSIS — B351 Tinea unguium: Secondary | ICD-10-CM | POA: Diagnosis not present

## 2021-12-10 DIAGNOSIS — E1142 Type 2 diabetes mellitus with diabetic polyneuropathy: Secondary | ICD-10-CM | POA: Diagnosis not present

## 2021-12-10 DIAGNOSIS — I739 Peripheral vascular disease, unspecified: Secondary | ICD-10-CM | POA: Diagnosis not present

## 2022-03-10 DIAGNOSIS — I129 Hypertensive chronic kidney disease with stage 1 through stage 4 chronic kidney disease, or unspecified chronic kidney disease: Secondary | ICD-10-CM | POA: Diagnosis not present

## 2022-03-10 DIAGNOSIS — D62 Acute posthemorrhagic anemia: Secondary | ICD-10-CM | POA: Diagnosis not present

## 2022-03-10 DIAGNOSIS — K5730 Diverticulosis of large intestine without perforation or abscess without bleeding: Secondary | ICD-10-CM | POA: Diagnosis not present

## 2022-03-10 DIAGNOSIS — H3530 Unspecified macular degeneration: Secondary | ICD-10-CM | POA: Diagnosis not present

## 2022-03-10 DIAGNOSIS — I2510 Atherosclerotic heart disease of native coronary artery without angina pectoris: Secondary | ICD-10-CM | POA: Diagnosis not present

## 2022-03-10 DIAGNOSIS — N1831 Chronic kidney disease, stage 3a: Secondary | ICD-10-CM | POA: Diagnosis not present

## 2022-03-10 DIAGNOSIS — E1151 Type 2 diabetes mellitus with diabetic peripheral angiopathy without gangrene: Secondary | ICD-10-CM | POA: Diagnosis not present

## 2022-03-10 DIAGNOSIS — M4810 Ankylosing hyperostosis [Forestier], site unspecified: Secondary | ICD-10-CM | POA: Diagnosis not present

## 2022-03-10 DIAGNOSIS — E785 Hyperlipidemia, unspecified: Secondary | ICD-10-CM | POA: Diagnosis not present

## 2022-03-10 DIAGNOSIS — I739 Peripheral vascular disease, unspecified: Secondary | ICD-10-CM | POA: Diagnosis not present

## 2022-03-13 DIAGNOSIS — I739 Peripheral vascular disease, unspecified: Secondary | ICD-10-CM | POA: Diagnosis not present

## 2022-03-13 DIAGNOSIS — E1142 Type 2 diabetes mellitus with diabetic polyneuropathy: Secondary | ICD-10-CM | POA: Diagnosis not present

## 2022-03-13 DIAGNOSIS — B351 Tinea unguium: Secondary | ICD-10-CM | POA: Diagnosis not present

## 2022-03-13 DIAGNOSIS — L603 Nail dystrophy: Secondary | ICD-10-CM | POA: Diagnosis not present

## 2022-03-26 DIAGNOSIS — M1732 Unilateral post-traumatic osteoarthritis, left knee: Secondary | ICD-10-CM | POA: Diagnosis not present

## 2022-03-26 DIAGNOSIS — M25562 Pain in left knee: Secondary | ICD-10-CM | POA: Diagnosis not present

## 2022-03-30 ENCOUNTER — Encounter (INDEPENDENT_AMBULATORY_CARE_PROVIDER_SITE_OTHER): Payer: Medicare Other | Admitting: Ophthalmology

## 2022-03-30 ENCOUNTER — Ambulatory Visit (INDEPENDENT_AMBULATORY_CARE_PROVIDER_SITE_OTHER): Payer: Medicare Other | Admitting: Ophthalmology

## 2022-03-30 ENCOUNTER — Encounter (INDEPENDENT_AMBULATORY_CARE_PROVIDER_SITE_OTHER): Payer: Self-pay | Admitting: Ophthalmology

## 2022-03-30 DIAGNOSIS — H353222 Exudative age-related macular degeneration, left eye, with inactive choroidal neovascularization: Secondary | ICD-10-CM

## 2022-03-30 DIAGNOSIS — H353114 Nonexudative age-related macular degeneration, right eye, advanced atrophic with subfoveal involvement: Secondary | ICD-10-CM | POA: Diagnosis not present

## 2022-03-30 DIAGNOSIS — H353212 Exudative age-related macular degeneration, right eye, with inactive choroidal neovascularization: Secondary | ICD-10-CM

## 2022-03-30 DIAGNOSIS — H353124 Nonexudative age-related macular degeneration, left eye, advanced atrophic with subfoveal involvement: Secondary | ICD-10-CM

## 2022-03-30 NOTE — Progress Notes (Signed)
03/30/2022     CHIEF COMPLAINT Patient presents for  Chief Complaint  Patient presents with   Macular Degeneration      HISTORY OF PRESENT ILLNESS: Christian Bailey is a 81 y.o. male who presents to the clinic today for:   HPI   5 MOS FOR DILATE OU, COLOR FP, OCT. Pt stated no changes in vision since last visit.  Last edited by Silvestre Moment on 03/30/2022  9:19 AM.      Referring physician: Prince Solian, MD [place],  [place] [address]  HISTORICAL INFORMATION:   Selected notes from the MEDICAL RECORD NUMBER    Lab Results  Component Value Date   HGBA1C 5.9 (H) 01/20/2019     CURRENT MEDICATIONS: No current outpatient medications on file. (Ophthalmic Drugs)   No current facility-administered medications for this visit. (Ophthalmic Drugs)   Current Outpatient Medications (Other)  Medication Sig   clopidogrel (PLAVIX) 75 MG tablet TAKE 1 TABLET DAILY   lisinopril (ZESTRIL) 5 MG tablet Take 5 mg by mouth 2 (two) times daily.   metFORMIN (GLUCOPHAGE) 1000 MG tablet Take 1,000 mg by mouth 2 (two) times daily.   pantoprazole (PROTONIX) 40 MG tablet Take 1 tablet (40 mg total) by mouth 2 (two) times daily.   rosuvastatin (CRESTOR) 20 MG tablet Take 20 mg by mouth every evening.    TOPROL XL 25 MG 24 hr tablet    No current facility-administered medications for this visit. (Other)      REVIEW OF SYSTEMS: ROS   Negative for: Constitutional, Gastrointestinal, Neurological, Skin, Genitourinary, Musculoskeletal, HENT, Endocrine, Cardiovascular, Eyes, Respiratory, Psychiatric, Allergic/Imm, Heme/Lymph Last edited by Silvestre Moment on 03/30/2022  9:19 AM.       ALLERGIES Allergies  Allergen Reactions   Asa [Aspirin] Other (See Comments)    GI bleeding   Gadolinium Derivatives Itching, Swelling and Other (See Comments)    Pt complained of face flushing/hottness and throat tightness/scratchiness immediately after the injections.  Within 4 minutes, all symptoms were  gone and the study was completed.  No further complications or signs of allergy were exhibited after completion of study.    Oxycodone-Acetaminophen Other (See Comments)    Dizziness and feeling of being uncomfortable     PAST MEDICAL HISTORY Past Medical History:  Diagnosis Date   Anemia    Complication of anesthesia    Diabetes mellitus ([place])    TYPE 2   GERD (gastroesophageal reflux disease)    History of blood transfusion    GI bleed   History of colon polyps    History of hiatal hernia    Hyperlipemia    Hypertension    Osteoarthritis    PAD (peripheral artery disease) (HCC)    a. stenting of his left common iliac artery >20 years ago. b. h/o LEIA stent and 2 stents to R SFA in 2011. c. 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 Noted during admission 04/2014 but upon review seems to be frequent finding for patient.   Stenosis of carotid artery    a. 50% right carotid stenosis by angiogram 04/2014.   Past Surgical  History:  Procedure Laterality Date   ABDOMINAL AORTOGRAM W/LOWER EXTREMITY N/A 01/27/2017   Procedure: Abdominal Aortogram w/Lower Extremity;  Surgeon: Serafina Mitchell, MD;  Location: [facility];  Service: Cardiovascular;  Laterality: N/A;   ABDOMINAL AORTOGRAM W/LOWER EXTREMITY N/A 06/08/2017   Procedure: ABDOMINAL AORTOGRAM W/LOWER EXTREMITY;  Surgeon: Serafina Mitchell, MD;  Location: [facility];  Service: Cardiovascular;  Laterality: N/A;   ABDOMINAL AORTOGRAM W/LOWER EXTREMITY N/A 08/31/2017   Procedure: ABDOMINAL AORTOGRAM W/LOWER EXTREMITY;  Surgeon: Serafina Mitchell, MD;  Location: [facility];   Service: Cardiovascular;  Laterality: N/A;  rt. unilateral   ANGIOPLASTY / STENTING FEMORAL     ANGIOPLASTY / STENTING ILIAC     AORTA - BILATERAL FEMORAL ARTERY BYPASS GRAFT N/A 07/06/2018   Procedure: AORTA BIFEMORAL BYPASS GRAFT USING 14X7MM X 40CM HEMASHIELD GOLD GRAFT;  Surgeon: Serafina Mitchell, MD;  Location: [facility];  Service: Vascular;  Laterality: N/A;   CEREBRAL ANGIOGRAM N/A 05/14/2014   Procedure: CEREBRAL ANGIOGRAM;  Surgeon: Lorretta Harp, MD;  Location[facility];  Service: Cardiovascular;  Laterality: N/A;   COLONOSCOPY W/ POLYPECTOMY     ENDARTERECTOMY Right 09/08/2017   ENDARTERECTOMY FEMORAL Right 09/08/2017   Procedure: REDO RIGHT FEMORAL ENDARTECTOMY WITH PATCH ANGIOPLASTY.;  Surgeon: Serafina Mitchell, MD;  Location: [facility];  Service: Vascular;  Laterality: Right;   EYE SURGERY Bilateral    cataract   FEMORAL ARTERY STENT Right 05/12/2010   Stented distally with a 6x100 Abbott absolute stent and proximally with a 6x60 Cook Zilver stent resulting in the reduction of the proximal segment 80% and mid segment 60-70% to 0% residual, LEFT common femoral artery stented with a 7x3 Smart stent resulting in reduction of 90% stenosis to 0% residual   FEMORAL-POPLITEAL BYPASS GRAFT Right 09/18/2016   Procedure: BYPASS GRAFT FEMORAL-POPLITEAL ARTERY;  Surgeon: Serafina Mitchell, MD;  Location: [facility];  Service: Vascular;  Laterality: Right;   FEMORAL-POPLITEAL BYPASS GRAFT Right 09/08/2017   Procedure: REDO BYPASS GRAFT FEMORAL-POPLITEAL ARTERY;  Surgeon: Serafina Mitchell, MD;  Location: [facility];  Service: Vascular;  Laterality: Right;   FEMORAL-POPLITEAL BYPASS GRAFT Right 07/06/2018   Procedure: REVISION RIGHT FEMORAL TO POPLITEAL ARTERY BYPASS GRAFT;  Surgeon: Serafina Mitchell, MD;  Location: [facility];  Service: Vascular;  Laterality: Right;   IR CHOLANGIOGRAM EXISTING TUBE  08/04/2018   IR EXCHANGE BILIARY DRAIN  01/22/2019   IR PERC CHOLECYSTOSTOMY  07/19/2018   IR RADIOLOGIST EVAL & MGMT   08/31/2018   IR RADIOLOGIST EVAL & MGMT  01/17/2019   IR RADIOLOGIST EVAL & MGMT  02/01/2019   IR RADIOLOGIST EVAL & MGMT  02/08/2019   KNEE ARTHROSCOPY     left   LOWER EXTREMITY ANGIOGRAM N/A 05/14/2014   Procedure: LOWER EXTREMITY ANGIOGRAM;  Surgeon: Lorretta Harp, MD;  Location: [facility] CATH LAB;  Service: Cardiovascular;  Laterality: N/A;   LOWER EXTREMITY ANGIOGRAPHY N/A 09/21/2016   Procedure: Lower Extremity Angiography;  Surgeon: Conrad [place], MD;  Location: [facility];  Service: Cardiovascular;  Laterality: N/A;   PERIPHERAL VASCULAR ATHERECTOMY Right 01/27/2017   Procedure: PERIPHERAL VASCULAR ATHERECTOMY;  Surgeon: Serafina Mitchell, MD;  Location: [facility];  Service: Cardiovascular;  Laterality: Right;   PERIPHERAL VASCULAR BALLOON ANGIOPLASTY Right 06/08/2017   Procedure: PERIPHERAL VASCULAR BALLOON ANGIOPLASTY;  Surgeon: Serafina Mitchell, MD;  Location: [facility];  Service: Cardiovascular;  Laterality: Right;  common femoral and superficial  femoral arteries   PERIPHERAL VASCULAR CATHETERIZATION N/A 04/20/2016   Procedure: Lower Extremity Intervention;  Surgeon: Lorretta Harp, MD;  Location: [facility];  Service: Cardiovascular;  Laterality: N/A;   PERIPHERAL VASCULAR INTERVENTION  08/31/2017   Procedure: PERIPHERAL VASCULAR INTERVENTION;  Surgeon: Serafina Mitchell, MD;  Location: [facility];  Service: Cardiovascular;;  REIA   REVERSE SHOULDER ARTHROPLASTY Right 12/07/2016   REVERSE SHOULDER ARTHROPLASTY Right 12/07/2016   Procedure: REVERSE SHOULDER ARTHROPLASTY;  Surgeon: Netta Cedars, MD;  Location: [facility];  Service: Orthopedics;  Laterality: Right;   ROTATOR CUFF REPAIR Right 2003   SFA Right 05/14/2014   PTA  OF RT SFA         DR BERRY    FAMILY HISTORY Family History  Problem Relation Age of Onset   Heart disease Mother    Leukemia Mother    Stomach cancer Father    Esophageal cancer Brother    Liver disease Brother    Alcoholism  Brother    Leukemia Brother    Leukemia Brother    Diabetes type II Brother    Lung disease Sister     SOCIAL HISTORY Social History   Tobacco Use   Smoking status: Former    Years: 25.00    Types: Cigarettes    Quit date: 06/1988    Years since quitting: 33.7   Smokeless tobacco: Never  Vaping Use   Vaping Use: Never used  Substance Use Topics   Alcohol use: Yes    Comment: rarely   Drug use: No         OPHTHALMIC EXAM:  Base Eye Exam     Visual Acuity (ETDRS)       Right Left   Dist cc 20/100 -1 CF at 2'   Dist ph cc NI     Correction: Glasses         Tonometry (Tonopen, 9:23 AM)       Right Left   Pressure 12 17         Pupils       Pupils APD   Right PERRL None   Left PERRL None         Visual Fields       Left Right    Full Full         Extraocular Movement       Right Left    Full, Ortho Full, Ortho         Neuro/Psych     Oriented x3: Yes   Mood/Affect: Normal         Dilation     Both eyes: 1.0% Mydriacyl, 2.5% Phenylephrine @ 9:23 AM           Slit Lamp and Fundus Exam     External Exam       Right Left   External Normal Normal         Slit Lamp Exam       Right Left   Lids/Lashes Normal Normal   Conjunctiva/Sclera White and quiet White and quiet   Cornea Clear Clear   Anterior Chamber Deep and quiet Deep and quiet   Iris Round and reactive Round and reactive   Lens Centered posterior chamber intraocular lens Decentered posterior chamber intraocular lens, stable over time with dense capsule and poor zonular support   Anterior Vitreous Normal Normal         Fundus Exam       Right Left  Posterior Vitreous Posterior vitreous detachment Posterior vitreous detachment   Disc Normal Normal   C/D Ratio 0.1 0.1   Macula Geographic atrophy slight progression 2.5 da size, disciform scar  Large Disciform scar, subfoveal subretinal scarring fibrosis   Vessels Normal Normal   Periphery Normal  Normal            IMAGING AND PROCEDURES  Imaging and Procedures for 03/30/22  OCT, Retina - OU - Both Eyes       Right Eye Quality was good. Scan locations included subfoveal. Central Foveal Thickness: 216. Progression has been stable. Findings include abnormal foveal contour, disciform scar, outer retinal atrophy.   Left Eye Quality was good. Scan locations included subfoveal. Central Foveal Thickness: 258. Progression has been stable. Findings include abnormal foveal contour, disciform scar, outer retinal atrophy.   Notes No active signs of CNVM OU, will continue to observe OD with extensive geographic atrophy stable over time no sign of CNVM OD  OS old disciform scar not active      Color Fundus Photography Optos - OU - Both Eyes       Right Eye Progression has been stable. Disc findings include normal observations. Macula : drusen, geographic atrophy. Vessels : normal observations. Periphery : normal observations.   Left Eye Progression has been stable. Disc findings include normal observations. Macula : drusen, geographic atrophy, retinal pigment epithelium abnormalities. Vessels : normal observations. Periphery : normal observations.   Notes OD dry geographic atrophy.  Subfoveal.  OS, disciform macular scar stable no active lesions or edges to the lesion              ASSESSMENT/PLAN:  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) Macular condition stable over time will observe  Advanced nonexudative age-related macular degeneration of right eye with subfoveal involvement Accounts for acuity stable over time  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 involvement Accounts for acuity  Exudative age-related macular degeneration of right eye with inactive choroidal neovascularization ([place]) No sign of recurrence     ICD-10-CM   1. Exudative age-related  macular degeneration of left eye with inactive choroidal neovascularization (HCC)  H35.3222 OCT, Retina - OU - Both Eyes    Color Fundus Photography Optos - OU - Both Eyes    2. Advanced nonexudative age-related macular degeneration of right eye with subfoveal involvement  H35.3114     3. 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 involvement  H35.3124     4. Exudative age-related macular degeneration of right eye with inactive choroidal neovascularization ([place])  H35.3212       1.  OU condition stable over time.  We will continue to monitor and observe.  2.  3.  Ophthalmic Meds Ordered this visit:  No orders of the defined types were placed in this encounter.      Return in about 6 months (around 09/29/2022) for DILATE OU, COLOR FP, OCT.  There are no Patient Instructions on file for this visit.   Explained the diagnoses, plan, and follow up with the patient and they expressed understanding.  Patient expressed understanding of the importance of proper follow up care.   Clent Demark Inman Fettig M.D. Diseases & Surgery of the Retina and Vitreous [facility] [place] 03/30/22     Abbreviations: M myopia (nearsighted); A astigmatism; H hyperopia (farsighted); P presbyopia; Mrx spectacle prescription;  CTL contact lenses; OD right eye; OS left eye; OU both  eyes  XT exotropia; ET esotropia; PEK punctate epithelial keratitis; PEE punctate epithelial erosions; DES dry eye syndrome; MGD meibomian gland dysfunction; ATs artificial tears; PFAT's preservative free artificial tears; [place] nuclear sclerotic cataract; PSC posterior subcapsular cataract; ERM epi-retinal membrane; PVD posterior vitreous detachment; RD retinal detachment; DM diabetes mellitus; DR diabetic retinopathy; NPDR non-proliferative diabetic retinopathy; PDR proliferative diabetic retinopathy; CSME clinically significant macular edema; DME diabetic macular edema; dbh dot blot hemorrhages;  CWS cotton wool spot; POAG primary open angle glaucoma; C/D cup-to-disc ratio; HVF humphrey visual field; GVF goldmann visual field; OCT optical coherence tomography; IOP intraocular pressure; BRVO Branch retinal vein occlusion; CRVO central retinal vein occlusion; CRAO central retinal artery occlusion; BRAO branch retinal artery occlusion; RT retinal tear; SB scleral buckle; PPV pars plana vitrectomy; VH Vitreous hemorrhage; PRP panretinal laser photocoagulation; IVK intravitreal kenalog; VMT vitreomacular traction; MH Macular hole;  NVD neovascularization of the disc; NVE neovascularization elsewhere; AREDS age related eye disease study; ARMD age related macular degeneration; POAG primary open angle glaucoma; EBMD epithelial/anterior basement membrane dystrophy; ACIOL anterior chamber intraocular lens; IOL intraocular lens; PCIOL posterior chamber intraocular lens; Phaco/IOL phacoemulsification with intraocular lens placement; [place] photorefractive keratectomy; LASIK laser assisted in situ keratomileusis; HTN hypertension; DM diabetes mellitus; COPD chronic obstructive pulmonary disease

## 2022-03-30 NOTE — Assessment & Plan Note (Signed)
Macular condition stable over time will observe

## 2022-03-30 NOTE — Assessment & Plan Note (Signed)
Accounts for acuity 

## 2022-03-30 NOTE — Assessment & Plan Note (Signed)
No sign of recurrence 

## 2022-03-30 NOTE — Assessment & Plan Note (Signed)
Accounts for acuity stable over time

## 2022-04-04 DIAGNOSIS — Z23 Encounter for immunization: Secondary | ICD-10-CM | POA: Diagnosis not present

## 2022-04-06 ENCOUNTER — Encounter (INDEPENDENT_AMBULATORY_CARE_PROVIDER_SITE_OTHER): Payer: Medicare Other | Admitting: Ophthalmology

## 2022-04-07 ENCOUNTER — Other Ambulatory Visit: Payer: Self-pay | Admitting: Cardiovascular Disease

## 2022-04-18 DIAGNOSIS — Z23 Encounter for immunization: Secondary | ICD-10-CM | POA: Diagnosis not present

## 2022-04-22 ENCOUNTER — Other Ambulatory Visit: Payer: Self-pay | Admitting: Cardiovascular Disease

## 2022-06-01 ENCOUNTER — Telehealth: Payer: Self-pay | Admitting: Cardiovascular Disease

## 2022-06-01 MED ORDER — CLOPIDOGREL BISULFATE 75 MG PO TABS: ORAL_TABLET | ORAL | 0 refills | Status: DC

## 2022-06-01 NOTE — Telephone Encounter (Signed)
*  STAT* If patient is at the pharmacy, call can be transferred to refill team.   1. Which medications need to be refilled? (please list name of each medication and dose if known) clopidogrel (PLAVIX) 75 MG tablet   2. Which pharmacy/location (including street and city if local pharmacy) is medication to be sent to? [facility] [place], [place]   3. Do they need a 30 day or 90 day supply? [place]

## 2022-06-12 DIAGNOSIS — L603 Nail dystrophy: Secondary | ICD-10-CM | POA: Diagnosis not present

## 2022-06-12 DIAGNOSIS — B351 Tinea unguium: Secondary | ICD-10-CM | POA: Diagnosis not present

## 2022-06-12 DIAGNOSIS — I739 Peripheral vascular disease, unspecified: Secondary | ICD-10-CM | POA: Diagnosis not present

## 2022-06-12 DIAGNOSIS — E1142 Type 2 diabetes mellitus with diabetic polyneuropathy: Secondary | ICD-10-CM | POA: Diagnosis not present

## 2022-06-17 ENCOUNTER — Other Ambulatory Visit: Payer: Self-pay | Admitting: Cardiovascular Disease

## 2022-07-07 ENCOUNTER — Encounter: Payer: Self-pay | Admitting: Cardiovascular Disease

## 2022-07-07 ENCOUNTER — Ambulatory Visit: Payer: Medicare Other | Attending: Cardiovascular Disease | Admitting: Cardiovascular Disease

## 2022-07-07 VITALS — BP 114/54 | HR 112 | Ht 65.0 in | Wt 146.4 lb

## 2022-07-07 DIAGNOSIS — I6521 Occlusion and stenosis of right carotid artery: Secondary | ICD-10-CM | POA: Insufficient documentation

## 2022-07-07 DIAGNOSIS — E782 Mixed hyperlipidemia: Secondary | ICD-10-CM | POA: Diagnosis not present

## 2022-07-07 DIAGNOSIS — I452 Bifascicular block: Secondary | ICD-10-CM | POA: Diagnosis not present

## 2022-07-07 DIAGNOSIS — I739 Peripheral vascular disease, unspecified: Secondary | ICD-10-CM | POA: Insufficient documentation

## 2022-07-07 DIAGNOSIS — I10 Essential (primary) hypertension: Secondary | ICD-10-CM | POA: Insufficient documentation

## 2022-07-07 MED ORDER — CLOPIDOGREL BISULFATE 75 MG PO TABS: ORAL_TABLET | ORAL | 3 refills | Status: DC

## 2022-07-07 NOTE — Assessment & Plan Note (Signed)
History of PAD multiple peripheral interventions by myself self including left external iliac and bilateral SFAs.  Ultimately his SFAs occluded by angiography that I performed 04/20/2016.  He had a right femoropopliteal bypass graft and the most ultimately underwent aortobifemoral bypass grafting by [name] without improvement in his symptoms.  His ABIs remain in the 0.5 range.

## 2022-07-07 NOTE — Patient Instructions (Addendum)
Medication Instructions:  Your physician recommends that you continue on your current medications as directed. Please refer to the Current Medication list given to you today.  *If you need a refill on your cardiac medications before your next appointment, please call your pharmacy*   Testing/Procedures: Your physician has requested that you have a carotid duplex. This test is an ultrasound of the carotid arteries in your neck. It looks at blood flow through these arteries that supply the brain with blood. Allow one hour for this exam. There are no restrictions or special instructions. This will take place at [place], [address].     Follow-Up: At [facility], you and your health needs are our priority.  As part of our continuing mission to provide you with exceptional heart care, we have created designated Provider Care Teams.  These Care Teams include your primary Cardiologist (physician) and Advanced Practice Providers (APPs -  Physician Assistants and Nurse Practitioners) who all work together to provide you with the care you need, when you need it.  We recommend signing up for the patient portal called "MyChart".  Sign up information is provided on this After Visit Summary.  MyChart is used to connect with patients for Virtual Visits (Telemedicine).  Patients are able to view lab/test results, encounter notes, upcoming appointments, etc.  Non-urgent messages can be sent to your provider as well.   To learn more about what you can do with MyChart, go to [email or web address].    Your next appointment:   12 month(s)  The format for your next appointment:   In Person  Provider:   Quay Burow, MD

## 2022-07-07 NOTE — Assessment & Plan Note (Signed)
History of essential hypertension blood pressure measured today at 114/54.  He is on lisinopril.

## 2022-07-07 NOTE — Assessment & Plan Note (Signed)
History of carotid artery disease with moderate right ICA stenosis by carotid duplex 06/13/2020.  This will be repeated on an annual basis.

## 2022-07-07 NOTE — Assessment & Plan Note (Signed)
History of hyperlipidemia on statin therapy with lipid profile performed 11/06/2021 revealing total cholesterol 134, LDL 74 and HDL 33.

## 2022-07-07 NOTE — Progress Notes (Signed)
07/07/2022 Christian Bailey   04-19-1941  [id number]  Primary Physician Prince Solian, MD Primary Cardiologist: Lorretta Harp MD [name], [place]  HPI:  Christian Bailey is a 82 y.o.  married Caucasian male, father of 2, grandfather to 1 grandchild who I last saw 02/23/2020.[name] [name] KitchenHis daughter-in-law , Dr. Tiajuana Amass. , is a primary care physician at [facility]. He has a history of PVOD and stenting of his left common iliac artery 20 years ago. He was smoking 3 packs a day at that time but stopped smoking 20 years ago.   His other problems include hypertension, hyperlipidemia and noninsulin-requiring diabetes. He had a negative Myoview in our office August 18, 2010. Because of claudication, I performed angiograph on him May 12, 2010, revealing an intact left common iliac artery stent, high-grade left external iliac artery stenosis which I stented using a 7 mm x 3 cm long Nitinol self-expanding stent. I also put 2 stents in his right SFA. Followup Dopplers showed improvement but symptomatically he did not really improve much. He still complains of predictable bilateral lower extremity claudication at several hundred yards. His Dopplers have remained stable. Dr. Dagmar Hait follows his lipid profile which was recently done earlier this month revealing a total cholesterol of 153, LDL of 86 and HDL of 34. Recent lower with Doppler studies performed in October 2014 revealed patent stents with a right ABI of 0.81 and a left of 0.67.over the past year he's noticed progressive but without limiting claudication with recent Dopplers performed 04/04/14 revealing a decline in his right ABI from 0.81-0.53 with progression of disease in his right SFA suggesting "in-stent restenosis.I also performed carotid Doppler studies that suggested moderately severe right internal carotid artery stenosis.    As a result of these studies I performed angiography on him 05/14/14 revealing a patent left external iliac artery stent  with at most 30-40% "in-stent restenosis, and known occluded left SFA with two-vessel runoff (occluded posterior tibial), patent proximal right SFA stent with diffuse "95% in-stent restenosis" within the distal right SFA stent. He had three-vessel runoff on the right. I performed a drug eluting balloon angioplasty for the in-stent restenosis with excellent angiographic and Doppler result although his symptoms did not significantly improve. I also angiogram his carotids because of a moderately severe right ICA stenosis by duplex ultrasound revealing a 50% hypodense right internal carotid artery stenosis with a bovine arch. We have continued to follow these noninvasively. He is neurologically asymptomatic. He denies chest pain or shortness of breath. Since I saw him a year [name] has noticed increasing claudication bilaterally. We obtained lower extremity Dopplers 12/09/15 revealing a decline in his right ABI from 0.85 down to 0.68 and occluded distal right SFA. We did begin him on Pletal which she has taken for last several months with minimal benefit. I angiogram 1010/23/17 revealing a widely patent left iliac stent, occluded left SFA with two-vessel runoff and a occluded right SFA with two-vessel runoff. I did not think he had percutaneous options for revascularization and he therefore underwent right femoropopliteal bypass grafting by [name] in March of this year. He's been reinjured them twice demonstrating a patent graft despite the fact that his symptoms have gotten worse. He is also had a reverse right shoulder replacement by Dr. Veverly Fells in June of this 2019.    He underwent aortobifemoral bypass grafting by [name] but has had minimal benefit with regards to claudication.  He has had a PERC drain put in his  gallbladder which has had for the last 8 months and recent mission for a gastrointestinal infectious illness.  He denies chest pain or shortness of breath.  He is scheduled for potential  cholecystectomy in the upcoming future which she will be cleared for at low risk given his recent low risk Myoview in December and normal 2D echocardiogram.   Since I saw him a year ago he is remained cardiovascular stable.  He denies chest pain, shortness of breath.  He does continue to have claudication however.   Current Meds  Medication Sig   lisinopril (ZESTRIL) 5 MG tablet Take 5 mg by mouth 2 (two) times daily.   metFORMIN (GLUCOPHAGE) 1000 MG tablet Take 1,000 mg by mouth 2 (two) times daily.   pantoprazole (PROTONIX) 40 MG tablet Take 1 tablet (40 mg total) by mouth 2 (two) times daily. Please keep scheduled appointment with cardiologist   rosuvastatin (CRESTOR) 20 MG tablet Take 20 mg by mouth every evening.    TOPROL XL 25 MG 24 hr tablet    [DISCONTINUED] clopidogrel (PLAVIX) 75 MG tablet TAKE 1 TABLET DAILY     Allergies  Allergen Reactions   Asa [Aspirin] Other (See Comments)    GI bleeding   Gadolinium Derivatives Itching, Swelling and Other (See Comments)    Pt complained of face flushing/hottness and throat tightness/scratchiness immediately after the injections.  Within 4 minutes, all symptoms were gone and the study was completed.  No further complications or signs of allergy were exhibited after completion of study.    Oxycodone-Acetaminophen Other (See Comments)    Dizziness and feeling of being uncomfortable     Social History   Socioeconomic History   Marital status: Married    Spouse name: Not on file   Number of children: 2   Years of education: Not on file   Highest education level: Not on file  Occupational History   Occupation: retired    Fish farm manager: RETIRED  Tobacco Use   Smoking status: Former    Years: 25.00    Types: Cigarettes    Quit date: 06/1988    Years since quitting: 34.0   Smokeless tobacco: Never  Vaping Use   Vaping Use: Never used  Substance and Sexual Activity   Alcohol use: Yes    Comment: rarely   Drug use: No   Sexual  activity: Not on file  Other Topics Concern   Not on file  Social History Narrative   Not on file   Social Determinants of Health   Financial Resource Strain: Not on file  Food Insecurity: Not on file  Transportation Needs: Not on file  Physical Activity: Not on file  Stress: Not on file  Social Connections: Not on file  Intimate Partner Violence: Not on file     Review of Systems: General: negative for chills, fever, night sweats or weight changes.  Cardiovascular: negative for chest pain, dyspnea on exertion, edema, orthopnea, palpitations, paroxysmal nocturnal dyspnea or shortness of breath Dermatological: negative for rash Respiratory: negative for cough or wheezing Urologic: negative for hematuria Abdominal: negative for nausea, vomiting, diarrhea, bright red blood per rectum, melena, or hematemesis Neurologic: negative for visual changes, syncope, or dizziness All other systems reviewed and are otherwise negative except as noted above.    Blood pressure (!) 114/54, pulse (!) 112, height '5\' 5"'$  (1.651 m), weight 146 lb 6.4 oz (66.4 kg), SpO2 98 %.  General appearance: alert and no distress Neck: no adenopathy, no carotid bruit, no JVD,  supple, symmetrical, trachea midline, and thyroid not enlarged, symmetric, no tenderness/mass/nodules Lungs: clear to auscultation bilaterally Heart: regular rate and rhythm, S1, S2 normal, no murmur, click, rub or gallop Extremities: extremities normal, atraumatic, no cyanosis or edema Pulses: Absent pedal pulses Skin: Skin color, texture, turgor normal. No rashes or lesions Neurologic: Grossly normal  EKG sinus tachycardia at 112 with right bundle branch block.  Personally reviewed this EKG.  ASSESSMENT AND PLAN:   Essential hypertension History of essential hypertension blood pressure measured today at 114/54.  He is on lisinopril.  PAD (peripheral artery disease) (HCC) History of PAD multiple peripheral interventions by myself  self including left external iliac and bilateral SFAs.  Ultimately his SFAs occluded by angiography that I performed 04/20/2016.  He had a right femoropopliteal bypass graft and the most ultimately underwent aortobifemoral bypass grafting by [name] without improvement in his symptoms.  His ABIs remain in the 0.5 range.  RBBB (right bundle branch block with left anterior fascicular block) Chronic  Hyperlipidemia History of hyperlipidemia on statin therapy with lipid profile performed 11/06/2021 revealing total cholesterol 134, LDL 74 and HDL 33.  Carotid artery disease ([place]) History of carotid artery disease with moderate right ICA stenosis by carotid duplex 06/13/2020.  This will be repeated on an annual basis.     Lorretta Harp MD FACP,FACC,FAHA, [facility] 07/07/2022 11:42 AM

## 2022-07-07 NOTE — Assessment & Plan Note (Signed)
Chronic. 

## 2022-07-13 DIAGNOSIS — R7989 Other specified abnormal findings of blood chemistry: Secondary | ICD-10-CM | POA: Diagnosis not present

## 2022-07-13 DIAGNOSIS — I2510 Atherosclerotic heart disease of native coronary artery without angina pectoris: Secondary | ICD-10-CM | POA: Diagnosis not present

## 2022-07-13 DIAGNOSIS — E785 Hyperlipidemia, unspecified: Secondary | ICD-10-CM | POA: Diagnosis not present

## 2022-07-13 DIAGNOSIS — D649 Anemia, unspecified: Secondary | ICD-10-CM | POA: Diagnosis not present

## 2022-07-13 DIAGNOSIS — D62 Acute posthemorrhagic anemia: Secondary | ICD-10-CM | POA: Diagnosis not present

## 2022-07-13 DIAGNOSIS — E1151 Type 2 diabetes mellitus with diabetic peripheral angiopathy without gangrene: Secondary | ICD-10-CM | POA: Diagnosis not present

## 2022-07-13 DIAGNOSIS — Z125 Encounter for screening for malignant neoplasm of prostate: Secondary | ICD-10-CM | POA: Diagnosis not present

## 2022-07-23 DIAGNOSIS — I2510 Atherosclerotic heart disease of native coronary artery without angina pectoris: Secondary | ICD-10-CM | POA: Diagnosis not present

## 2022-07-23 DIAGNOSIS — H3530 Unspecified macular degeneration: Secondary | ICD-10-CM | POA: Diagnosis not present

## 2022-07-23 DIAGNOSIS — K5730 Diverticulosis of large intestine without perforation or abscess without bleeding: Secondary | ICD-10-CM | POA: Diagnosis not present

## 2022-07-23 DIAGNOSIS — I129 Hypertensive chronic kidney disease with stage 1 through stage 4 chronic kidney disease, or unspecified chronic kidney disease: Secondary | ICD-10-CM | POA: Diagnosis not present

## 2022-07-23 DIAGNOSIS — N3941 Urge incontinence: Secondary | ICD-10-CM | POA: Diagnosis not present

## 2022-07-23 DIAGNOSIS — R1084 Generalized abdominal pain: Secondary | ICD-10-CM | POA: Diagnosis not present

## 2022-07-23 DIAGNOSIS — N1832 Chronic kidney disease, stage 3b: Secondary | ICD-10-CM | POA: Diagnosis not present

## 2022-07-23 DIAGNOSIS — R2681 Unsteadiness on feet: Secondary | ICD-10-CM | POA: Diagnosis not present

## 2022-07-23 DIAGNOSIS — Z0000 Encounter for general adult medical examination without abnormal findings: Secondary | ICD-10-CM | POA: Diagnosis not present

## 2022-07-23 DIAGNOSIS — E1151 Type 2 diabetes mellitus with diabetic peripheral angiopathy without gangrene: Secondary | ICD-10-CM | POA: Diagnosis not present

## 2022-07-23 DIAGNOSIS — I739 Peripheral vascular disease, unspecified: Secondary | ICD-10-CM | POA: Diagnosis not present

## 2022-07-23 DIAGNOSIS — R82998 Other abnormal findings in urine: Secondary | ICD-10-CM | POA: Diagnosis not present

## 2022-07-23 DIAGNOSIS — M4810 Ankylosing hyperostosis [Forestier], site unspecified: Secondary | ICD-10-CM | POA: Diagnosis not present

## 2022-07-29 ENCOUNTER — Ambulatory Visit (HOSPITAL_COMMUNITY)
Admission: RE | Admit: 2022-07-29 | Discharge: 2022-07-29 | Disposition: A | Discharge status: Home / Self Care | Payer: Medicare Other | Source: Ambulatory Visit | Attending: Cardiology | Admitting: Cardiology

## 2022-07-29 DIAGNOSIS — I10 Essential (primary) hypertension: Secondary | ICD-10-CM | POA: Insufficient documentation

## 2022-07-29 DIAGNOSIS — I739 Peripheral vascular disease, unspecified: Secondary | ICD-10-CM | POA: Insufficient documentation

## 2022-07-29 DIAGNOSIS — E782 Mixed hyperlipidemia: Secondary | ICD-10-CM | POA: Diagnosis not present

## 2022-07-29 DIAGNOSIS — I6521 Occlusion and stenosis of right carotid artery: Secondary | ICD-10-CM | POA: Diagnosis not present

## 2022-08-12 ENCOUNTER — Ambulatory Visit: Payer: Medicare Other | Attending: Internal Medicine

## 2022-08-12 ENCOUNTER — Other Ambulatory Visit: Payer: Self-pay

## 2022-08-12 DIAGNOSIS — R2689 Other abnormalities of gait and mobility: Secondary | ICD-10-CM | POA: Insufficient documentation

## 2022-08-12 DIAGNOSIS — R2681 Unsteadiness on feet: Secondary | ICD-10-CM | POA: Insufficient documentation

## 2022-08-12 DIAGNOSIS — R262 Difficulty in walking, not elsewhere classified: Secondary | ICD-10-CM | POA: Diagnosis not present

## 2022-08-12 NOTE — Therapy (Signed)
OUTPATIENT PHYSICAL THERAPY NEURO EVALUATION   Patient Name: Christian Bailey MRN: [id number] DOB:[birth year], 82 y.o., male Today's Date: 08/12/2022   PCP: Prince Solian, MD REFERRING PROVIDER: Prince Solian, MD  END OF SESSION:  PT End of Session - 08/12/22 [id number]     Visit Number 1    Number of Visits 12    Date for PT Re-Evaluation 09/23/22    Authorization Type Medicare A&B / [employer] for life    Progress Note Due on Visit 10    PT Start Time 0845    PT Stop Time 0930    PT Time Calculation (min) 45 min             Past Medical History:  Diagnosis Date   Anemia    Complication of anesthesia    Diabetes mellitus ([place])    TYPE 2   GERD (gastroesophageal reflux disease)    History of blood transfusion    GI bleed   History of colon polyps    History of hiatal hernia    Hyperlipemia    Hypertension    Osteoarthritis    PAD (peripheral artery disease) ([place])    a. stenting of his left common iliac artery >20 years ago. b. h/o LEIA stent and 2 stents to R SFA in 2011. c. 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 Noted during admission 04/2014 but upon review seems to be frequent finding for patient.   Stenosis of carotid artery    a. 50% right carotid stenosis by angiogram 04/2014.   Past Surgical History:  Procedure Laterality Date   ABDOMINAL AORTOGRAM W/LOWER EXTREMITY N/A 01/27/2017   Procedure: Abdominal Aortogram w/Lower Extremity;  Surgeon: Serafina Mitchell, MD;  Location: [facility];  Service: Cardiovascular;  Laterality: N/A;   ABDOMINAL AORTOGRAM W/LOWER EXTREMITY N/A 06/08/2017   Procedure: ABDOMINAL  AORTOGRAM W/LOWER EXTREMITY;  Surgeon: Serafina Mitchell, MD;  Location: [facility];  Service: Cardiovascular;  Laterality: N/A;   ABDOMINAL AORTOGRAM W/LOWER EXTREMITY N/A 08/31/2017   Procedure: ABDOMINAL AORTOGRAM W/LOWER EXTREMITY;  Surgeon: Serafina Mitchell, MD;  Location: [facility];  Service: Cardiovascular;  Laterality: N/A;  rt. unilateral   ANGIOPLASTY / STENTING FEMORAL     ANGIOPLASTY / STENTING ILIAC     AORTA - BILATERAL FEMORAL ARTERY BYPASS GRAFT N/A 07/06/2018   Procedure: AORTA BIFEMORAL BYPASS GRAFT USING 14X7MM X 40CM HEMASHIELD GOLD GRAFT;  Surgeon: Serafina Mitchell, MD;  Location: [facility];  Service: Vascular;  Laterality: N/A;   CEREBRAL ANGIOGRAM N/A 05/14/2014   Procedure: CEREBRAL ANGIOGRAM;  Surgeon: Lorretta Harp, MD;  Location: [facility];  Service: Cardiovascular;  Laterality: N/A;   COLONOSCOPY W/ POLYPECTOMY     ENDARTERECTOMY Right 09/08/2017   ENDARTERECTOMY FEMORAL Right 09/08/2017   Procedure: REDO RIGHT FEMORAL ENDARTECTOMY WITH PATCH ANGIOPLASTY.;  Surgeon: Serafina Mitchell, MD;  Location: [facility];  Service: Vascular;  Laterality: Right;   EYE SURGERY Bilateral    cataract   FEMORAL ARTERY STENT Right 05/12/2010   Stented distally with a 6x100 Abbott absolute stent and proximally with a 6x60 Cook Zilver  stent resulting in the reduction of the proximal segment 80% and mid segment 60-70% to 0% residual, LEFT common femoral artery stented with a 7x3 Smart stent resulting in reduction of 90% stenosis to 0% residual   FEMORAL-POPLITEAL BYPASS GRAFT Right 09/18/2016   Procedure: BYPASS GRAFT FEMORAL-POPLITEAL ARTERY;  Surgeon: Serafina Mitchell, MD;  Location: [facility];  Service: Vascular;  Laterality: Right;   FEMORAL-POPLITEAL BYPASS GRAFT Right 09/08/2017   Procedure: REDO BYPASS GRAFT FEMORAL-POPLITEAL ARTERY;  Surgeon: Serafina Mitchell, MD;  Location: [facility];  Service: Vascular;  Laterality: Right;   FEMORAL-POPLITEAL BYPASS GRAFT Right 07/06/2018    Procedure: REVISION RIGHT FEMORAL TO POPLITEAL ARTERY BYPASS GRAFT;  Surgeon: Serafina Mitchell, MD;  Location: [facility];  Service: Vascular;  Laterality: Right;   IR CHOLANGIOGRAM EXISTING TUBE  08/04/2018   IR EXCHANGE BILIARY DRAIN  01/22/2019   IR PERC CHOLECYSTOSTOMY  07/19/2018   IR RADIOLOGIST EVAL & MGMT  08/31/2018   IR RADIOLOGIST EVAL & MGMT  01/17/2019   IR RADIOLOGIST EVAL & MGMT  02/01/2019   IR RADIOLOGIST EVAL & MGMT  02/08/2019   KNEE ARTHROSCOPY     left   LOWER EXTREMITY ANGIOGRAM N/A 05/14/2014   Procedure: LOWER EXTREMITY ANGIOGRAM;  Surgeon: Lorretta Harp, MD;  Location: [facility];  Service: Cardiovascular;  Laterality: N/A;   LOWER EXTREMITY ANGIOGRAPHY N/A 09/21/2016   Procedure: Lower Extremity Angiography;  Surgeon: Conrad [place], MD;  Location: [facility];  Service: Cardiovascular;  Laterality: N/A;   PERIPHERAL VASCULAR ATHERECTOMY Right 01/27/2017   Procedure: PERIPHERAL VASCULAR ATHERECTOMY;  Surgeon: Serafina Mitchell, MD;  Location: [facility];  Service: Cardiovascular;  Laterality: Right;   PERIPHERAL VASCULAR BALLOON ANGIOPLASTY Right 06/08/2017   Procedure: PERIPHERAL VASCULAR BALLOON ANGIOPLASTY;  Surgeon: Serafina Mitchell, MD;  Location: [facility];  Service: Cardiovascular;  Laterality: Right;  common femoral and superficial femoral arteries   PERIPHERAL VASCULAR CATHETERIZATION N/A 04/20/2016   Procedure: Lower Extremity Intervention;  Surgeon: Lorretta Harp, MD;  Location: [facility];  Service: Cardiovascular;  Laterality: N/A;   PERIPHERAL VASCULAR INTERVENTION  08/31/2017   Procedure: PERIPHERAL VASCULAR INTERVENTION;  Surgeon: Serafina Mitchell, MD;  Location: [facility];  Service: Cardiovascular;;  REIA   REVERSE SHOULDER ARTHROPLASTY Right 12/07/2016   REVERSE SHOULDER ARTHROPLASTY Right 12/07/2016   Procedure: REVERSE SHOULDER ARTHROPLASTY;  Surgeon: Netta Cedars, MD;  Location: [facility];  Service: Orthopedics;  Laterality:  Right;   ROTATOR CUFF REPAIR Right 2003   SFA Right 05/14/2014   PTA  OF RT SFA         DR BERRY   Patient Active Problem List   Diagnosis Date Noted   Exudative age-related macular degeneration of left eye with inactive choroidal neovascularization ([place]) 03/25/2020   Exudative age-related macular degeneration of right eye with inactive choroidal neovascularization ([place]) 03/25/2020   Advanced nonexudative age-related macular degeneration of right eye with subfoveal involvement 03/25/2020   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 involvement 03/25/2020   Bacteremia due to Gram-negative bacteria 01/19/2019   CKD (chronic kidney disease) stage 3, GFR 30-59 ml/min ([facility]) 01/19/2019   Stenosis of infrarenal abdominal aorta due to atherosclerosis ([place]) 07/06/2018   S/P shoulder replacement, right 12/07/2016   PVD (peripheral vascular disease) ([place]) 09/18/2016   Sinus tachycardia 05/15/2014   Carotid artery disease ([place]) 04/26/2014   PAD (peripheral artery disease) ([place]) 06/12/2013   RBBB (right bundle  branch block with left anterior fascicular block) 06/12/2013   Claudication ([place]) 12/14/2012   Essential hypertension 12/14/2012   Hyperlipidemia 12/14/2012   Type 2 diabetes mellitus ([place]) 12/14/2012   Personal history of colonic polyps 01/25/2012   DM 08/11/2010   DUODENAL ULCER, ACUTE, HEMORRHAGE 08/11/2010    ONSET DATE: "quite some time"  REFERRING DIAG: R26.81 (ICD-10-CM) - Unsteadiness on feet  THERAPY DIAG:  Unsteadiness on feet  Difficulty in walking, not elsewhere classified  Other abnormalities of gait and mobility  Rationale for Evaluation and Treatment: Rehabilitation  SUBJECTIVE:                                                                                                                                                                                             SUBJECTIVE STATEMENT: Better part of 20 years been treated for  PAD and has had several major vascular surgeries and notes that his symptoms have progressively worsened and notes that 100 paces or 2 minutes is his limit for endurance. Pt reports falls and balance issues are also present and reports difficulty even rolling over in bed. Reports that he has ABI checked routinely but does not know the figure. Reports a general numbness in his LE Pt accompanied by: self  PERTINENT HISTORY: hypertension, hyperlipidemia and noninsulin-requiring diabetes, legally blind  PAIN:  Are you having pain? No  PRECAUTIONS: Fall  WEIGHT BEARING RESTRICTIONS: No  FALLS: Has patient fallen in last 6 months? Yes. Number of falls 3  LIVING ENVIRONMENT: Lives with: lives with their family and lives with their spouse Lives in: House/apartment Stairs: Yes: Internal: yes but not used steps; bilateral but cannot reach both and External: 3 steps; bilateral but cannot reach both Has following equipment at home: [employer] - 4 wheeled  PLOF: Independent  PATIENT GOALS: improve balance, stamina  OBJECTIVE:   DIAGNOSTIC FINDINGS:   COGNITION: Overall cognitive status: Within functional limits for tasks assessed   SENSATION: Not tested  COORDINATION: WFL  EDEMA:  None present  MUSCLE TONE: NT  MUSCLE LENGTH: NT  DTRs:  NT  POSTURE: No Significant postural limitations  LOWER EXTREMITY ROM:     Active  Right Eval Left Eval  Hip flexion    Hip extension    Hip abduction    Hip adduction    Hip internal rotation    Hip external rotation    Knee flexion    Knee extension    Ankle dorsiflexion    Ankle plantarflexion    Ankle inversion    Ankle eversion     (Blank rows = not tested)  LOWER EXTREMITY MMT:  MMT Right Eval Left Eval  Hip flexion    Hip extension    Hip abduction    Hip adduction    Hip internal rotation    Hip external rotation    Knee flexion    Knee extension    Ankle dorsiflexion    Ankle plantarflexion    Ankle  inversion    Ankle eversion    (Blank rows = not tested)  BED MOBILITY:  DNT but pt reports incr difficulty with bed mobility  TRANSFERS: Assistive device utilized: None  Sit to stand: Complete Independence and Modified independence Stand to sit: Complete Independence and Modified independence Chair to chair: Complete Independence and Modified independence Floor: Modified independence and SBA, requires UE support on furniture. Unable to bend over to pick up item from ground. Kicks object over towards object that he can use of UE support and assumes half-kneeling  RAMP:  NT  CURB:  Level of Assistance: Modified independence and SBA Assistive device utilized: None Curb Comments: must stop and "size up" the curb  STAIRS: Level of Assistance: Modified independence Stair Negotiation Technique: Alternating Pattern  with Bilateral Rails Number of Stairs: 12  Height of Stairs: 4-6"  Comments:   GAIT: Gait pattern:  decr pelvic rotation and decreased trunk rotation, ankles maintained in DF throughout initial contact and loading response Distance walked: 315 (2MWT) Assistive device utilized: None Level of assistance: Complete Independence and SBA Comments: unsteady during turns  FUNCTIONAL TESTS:  5 times sit to stand: 13.44 Timed up and go (TUG): 14.75 2 minute walk test: vitals at start 98%, 94 bpm 315 ft and rates 13/20 Borg exertion (somewhat hard) 97% and 110 bpm Berg Balance Scale: 37/56 2.6 ft/sec speed based on 2MWT performance  M-CTSIB  Condition 1: Firm Surface, EO 30 Sec, Normal and Mild Sway  Condition 2: Firm Surface, EC 30 Sec, Normal and Mild Sway  Condition 3: Foam Surface, EO 30 Sec, Moderate and Severe Sway  Condition 4: Foam Surface, EC 4 Sec, Moderate and Severe Sway     PATIENT SURVEYS:    TODAY'S TREATMENT:                                                                                                                              DATE: see HEP 08/12/22     PATIENT EDUCATION: Education details: assessment findings and HEP initiation Person educated: Patient Education method: Explanation Education comprehension: verbalized understanding  HOME EXERCISE PROGRAM: Access Code: [email or web address] URL: [email or web address] Date: 08/12/2022 Prepared by: Sherlyn Lees  Exercises - Corner Balance Feet Together With Eyes Open  - 1 x daily - 7 x weekly - 3 sets - 30 sec hold - Corner Balance Feet Together With Eyes Closed  - 1 x daily - 7 x weekly - 3 sets - 30 sec hold - Semi-Tandem Corner Balance With Eyes Open  - 1 x daily - 7 x weekly - 3 sets - 15 sec hold  GOALS: Goals reviewed with patient? Yes  SHORT TERM GOALS: Target date: 09/02/2022    Independent with HEP to improve functional outcomes Baseline:initiated Goal status: INITIAL  2.  Teach-back principles of exercise for improved muscular endurance in order to combat effects of vascular claudication Baseline:  Goal status: INITIAL  3.  Demonstrate improved safety with gait/mobility per time of 10 sec TUG test Baseline: 13 sec Goal status: INITIAL    LONG TERM GOALS: Target date: 09/23/2022    Demonstrate low risk for falls per score 50/56 Berg Balance Test Baseline: 37/56 Goal status: INITIAL  2.  Manifest improved gait tolerance/endurance per distance of 470 ft during 2MWT with RPE not exceeding 11/20 Baseline: 315 ft w/ RPE 13/20 Borg Goal status: INITIAL  3.  Independent with advanced HEP to address patient's range of deficits/limitations Baseline:  Goal status: INITIAL  4. Improve body mechanics/flexibility to enable bending/stooping/squatting to pick up items from ground  Baseline: Unable  Goal status: initial  ASSESSMENT:  CLINICAL IMPRESSION: Patient is a 82 y.o. male who was seen today for physical therapy evaluation and treatment for unsteadiness and difficulty in walking. Exhibits high risk for falls during gait/mobility per TUG test time and score  of 37/56 Berg Balance Test.  Endurance also exhibits limitations to tolerance as evidenced by limited distance during 2MWT which is significantly less than his age-matched peers and pt reporting symptoms consistent with vascular claudication in his LE with prolonged efforts. Pt would benefit from PT services to address his mobility, balance, gait, and endurance deficits.    OBJECTIVE IMPAIRMENTS: Abnormal gait, cardiopulmonary status limiting activity, decreased activity tolerance, decreased endurance, decreased knowledge of use of DME, decreased mobility, difficulty walking, decreased strength, impaired flexibility, and improper body mechanics.   ACTIVITY LIMITATIONS: carrying, lifting, bending, squatting, transfers, bed mobility, reach over head, and locomotion level  PARTICIPATION LIMITATIONS: meal prep, cleaning, laundry, interpersonal relationship, community activity, and yard work  PERSONAL FACTORS: Age, Fitness, Past/current experiences, Time since onset of injury/illness/exacerbation, and interaction of conditions  are also affecting patient's functional outcome.   REHAB POTENTIAL: Good  CLINICAL DECISION MAKING: Evolving/moderate complexity  EVALUATION COMPLEXITY: Moderate  PLAN:  PT FREQUENCY: 2x/week  PT DURATION: 6 weeks  PLANNED INTERVENTIONS: Therapeutic exercises, Therapeutic activity, Neuromuscular re-education, Balance training, Gait training, Patient/Family education, Self Care, Joint mobilization, Stair training, Vestibular training, Canalith repositioning, DME instructions, Aquatic Therapy, Dry Needling, Electrical stimulation, Spinal mobilization, Cryotherapy, Moist heat, Taping, Traction, Ionotophoresis 81m/ml Dexamethasone, and Manual therapy  PLAN FOR NEXT SESSION: review corner balance, assess bed mobility (maybe add some stretching/flexibility activities while visiting this), progressive resistance exercises for muscular endurance   10:43 AM, 08/12/22 M. KSherlyn Lees PT, DPT Physical Therapist- CMakahaOffice Number: [phone]

## 2022-08-17 ENCOUNTER — Ambulatory Visit: Payer: Medicare Other

## 2022-08-17 DIAGNOSIS — R2689 Other abnormalities of gait and mobility: Secondary | ICD-10-CM | POA: Diagnosis not present

## 2022-08-17 DIAGNOSIS — R262 Difficulty in walking, not elsewhere classified: Secondary | ICD-10-CM

## 2022-08-17 DIAGNOSIS — R2681 Unsteadiness on feet: Secondary | ICD-10-CM | POA: Diagnosis not present

## 2022-08-17 NOTE — Therapy (Signed)
OUTPATIENT PHYSICAL THERAPY NEURO EVALUATION   Patient Name: Christian Bailey MRN: [id number] DOB:Jul 29, 1940, 82 y.o., male Today's Date: 08/17/2022   PCP: Prince Solian, MD REFERRING PROVIDER: Prince Solian, MD  END OF SESSION:  PT End of Session - 08/17/22 0845     Visit Number 2    Number of Visits 12    Date for PT Re-Evaluation 09/23/22    Authorization Type Medicare A&B / [employer] for life    Progress Note Due on Visit 10    PT Start Time 0845    PT Stop Time 0930    PT Time Calculation (min) 45 min             Past Medical History:  Diagnosis Date   Anemia    Complication of anesthesia    Diabetes mellitus ([place])    TYPE 2   GERD (gastroesophageal reflux disease)    History of blood transfusion    GI bleed   History of colon polyps    History of hiatal hernia    Hyperlipemia    Hypertension    Osteoarthritis    PAD (peripheral artery disease) ([place])    a. stenting of his left common iliac artery >20 years ago. b. h/o LEIA stent and 2 stents to R SFA in 2011. c. 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 Noted during admission 04/2014 but upon review seems to be frequent finding for patient.   Stenosis of carotid artery    a. 50% right carotid stenosis by angiogram 04/2014.   Past Surgical History:  Procedure Laterality Date   ABDOMINAL AORTOGRAM W/LOWER EXTREMITY N/A 01/27/2017   Procedure: Abdominal Aortogram w/Lower Extremity;  Surgeon: Serafina Mitchell, MD;  Location: [place] [facility];  Service: Cardiovascular;  Laterality: N/A;   ABDOMINAL AORTOGRAM W/LOWER EXTREMITY N/A 06/08/2017   Procedure: ABDOMINAL  AORTOGRAM W/LOWER EXTREMITY;  Surgeon: Serafina Mitchell, MD;  Location: [place] [facility];  Service: Cardiovascular;  Laterality: N/A;   ABDOMINAL AORTOGRAM W/LOWER EXTREMITY N/A 08/31/2017   Procedure: ABDOMINAL AORTOGRAM W/LOWER EXTREMITY;  Surgeon: Serafina Mitchell, MD;  Location: [place] [facility];  Service: Cardiovascular;  Laterality: N/A;  rt. unilateral   ANGIOPLASTY / STENTING FEMORAL     ANGIOPLASTY / STENTING ILIAC     AORTA - BILATERAL FEMORAL ARTERY BYPASS GRAFT N/A 07/06/2018   Procedure: AORTA BIFEMORAL BYPASS GRAFT USING 14X7MM X 40CM HEMASHIELD GOLD GRAFT;  Surgeon: Serafina Mitchell, MD;  Location: [place];  Service: Vascular;  Laterality: N/A;   CEREBRAL ANGIOGRAM N/A 05/14/2014   Procedure: CEREBRAL ANGIOGRAM;  Surgeon: Lorretta Harp, MD;  Location: [facility];  Service: Cardiovascular;  Laterality: N/A;   COLONOSCOPY W/ POLYPECTOMY     ENDARTERECTOMY Right 09/08/2017   ENDARTERECTOMY FEMORAL Right 09/08/2017   Procedure: REDO RIGHT FEMORAL ENDARTECTOMY WITH PATCH ANGIOPLASTY.;  Surgeon: Serafina Mitchell, MD;  Location: [facility];  Service: Vascular;  Laterality: Right;   EYE SURGERY Bilateral    cataract   FEMORAL ARTERY STENT Right 05/12/2010   Stented distally with a 6x100 Abbott absolute stent and proximally with a 6x60 Cook Zilver  stent resulting in the reduction of the proximal segment 80% and mid segment 60-70% to 0% residual, LEFT common femoral artery stented with a 7x3 Smart stent resulting in reduction of 90% stenosis to 0% residual   FEMORAL-POPLITEAL BYPASS GRAFT Right 09/18/2016   Procedure: BYPASS GRAFT FEMORAL-POPLITEAL ARTERY;  Surgeon: Serafina Mitchell, MD;  Location: [place];  Service: Vascular;  Laterality: Right;   FEMORAL-POPLITEAL BYPASS GRAFT Right 09/08/2017   Procedure: REDO BYPASS GRAFT FEMORAL-POPLITEAL ARTERY;  Surgeon: Serafina Mitchell, MD;  Location: [facility];  Service: Vascular;  Laterality: Right;   FEMORAL-POPLITEAL BYPASS GRAFT Right 07/06/2018    Procedure: REVISION RIGHT FEMORAL TO POPLITEAL ARTERY BYPASS GRAFT;  Surgeon: Serafina Mitchell, MD;  Location: [place];  Service: Vascular;  Laterality: Right;   IR CHOLANGIOGRAM EXISTING TUBE  08/04/2018   IR EXCHANGE BILIARY DRAIN  01/22/2019   IR PERC CHOLECYSTOSTOMY  07/19/2018   IR RADIOLOGIST EVAL & MGMT  08/31/2018   IR RADIOLOGIST EVAL & MGMT  01/17/2019   IR RADIOLOGIST EVAL & MGMT  02/01/2019   IR RADIOLOGIST EVAL & MGMT  02/08/2019   KNEE ARTHROSCOPY     left   LOWER EXTREMITY ANGIOGRAM N/A 05/14/2014   Procedure: LOWER EXTREMITY ANGIOGRAM;  Surgeon: Lorretta Harp, MD;  Location: [facility] CATH LAB;  Service: Cardiovascular;  Laterality: N/A;   LOWER EXTREMITY ANGIOGRAPHY N/A 09/21/2016   Procedure: Lower Extremity Angiography;  Surgeon: Conrad [place], MD;  Location: [place] [facility];  Service: Cardiovascular;  Laterality: N/A;   PERIPHERAL VASCULAR ATHERECTOMY Right 01/27/2017   Procedure: PERIPHERAL VASCULAR ATHERECTOMY;  Surgeon: Serafina Mitchell, MD;  Location: [place] [facility];  Service: Cardiovascular;  Laterality: Right;   PERIPHERAL VASCULAR BALLOON ANGIOPLASTY Right 06/08/2017   Procedure: PERIPHERAL VASCULAR BALLOON ANGIOPLASTY;  Surgeon: Serafina Mitchell, MD;  Location: [place] [facility];  Service: Cardiovascular;  Laterality: Right;  common femoral and superficial femoral arteries   PERIPHERAL VASCULAR CATHETERIZATION N/A 04/20/2016   Procedure: Lower Extremity Intervention;  Surgeon: Lorretta Harp, MD;  Location: [place] [facility];  Service: Cardiovascular;  Laterality: N/A;   PERIPHERAL VASCULAR INTERVENTION  08/31/2017   Procedure: PERIPHERAL VASCULAR INTERVENTION;  Surgeon: Serafina Mitchell, MD;  Location: [place] [facility];  Service: Cardiovascular;;  REIA   REVERSE SHOULDER ARTHROPLASTY Right 12/07/2016   REVERSE SHOULDER ARTHROPLASTY Right 12/07/2016   Procedure: REVERSE SHOULDER ARTHROPLASTY;  Surgeon: Netta Cedars, MD;  Location: [place];  Service: Orthopedics;  Laterality:  Right;   ROTATOR CUFF REPAIR Right 2003   SFA Right 05/14/2014   PTA  OF RT SFA         DR BERRY   Patient Active Problem List   Diagnosis Date Noted   Exudative age-related macular degeneration of left eye with inactive choroidal neovascularization ([place]) 03/25/2020   Exudative age-related macular degeneration of right eye with inactive choroidal neovascularization ([place]) 03/25/2020   Advanced nonexudative age-related macular degeneration of right eye with subfoveal involvement 03/25/2020   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 involvement 03/25/2020   Bacteremia due to Gram-negative bacteria 01/19/2019   CKD (chronic kidney disease) stage 3, GFR 30-59 ml/min ([facility]) 01/19/2019   Stenosis of infrarenal abdominal aorta due to atherosclerosis ([place]) 07/06/2018   S/P shoulder replacement, right 12/07/2016   PVD (peripheral vascular disease) ([place]) 09/18/2016   Sinus tachycardia 05/15/2014   Carotid artery disease ([place]) 04/26/2014   PAD (peripheral artery disease) ([place]) 06/12/2013   RBBB (right bundle  branch block with left anterior fascicular block) 06/12/2013   Claudication ([place]) 12/14/2012   Essential hypertension 12/14/2012   Hyperlipidemia 12/14/2012   Type 2 diabetes mellitus ([place]) 12/14/2012   Personal history of colonic polyps 01/25/2012   DM 08/11/2010   DUODENAL ULCER, ACUTE, HEMORRHAGE 08/11/2010    ONSET DATE: "quite some time"  REFERRING DIAG: R26.81 (ICD-10-CM) - Unsteadiness on feet  THERAPY DIAG:  Unsteadiness on feet  Difficulty in walking, not elsewhere classified  Other abnormalities of gait and mobility  Rationale for Evaluation and Treatment: Rehabilitation  SUBJECTIVE:                                                                                                                                                                                             SUBJECTIVE STATEMENT: Difficult time with the initial balance  activities   Pt accompanied by: self  PERTINENT HISTORY: hypertension, hyperlipidemia and noninsulin-requiring diabetes, legally blind  PAIN:  Are you having pain? No  PRECAUTIONS: Fall  WEIGHT BEARING RESTRICTIONS: No  FALLS: Has patient fallen in last 6 months? Yes. Number of falls 3  LIVING ENVIRONMENT: Lives with: lives with their family and lives with their spouse Lives in: House/apartment Stairs: Yes: Internal: yes but not used steps; bilateral but cannot reach both and External: 3 steps; bilateral but cannot reach both Has following equipment at home: Walker - 4 wheeled  PLOF: Independent  PATIENT GOALS: improve balance, stamina  OBJECTIVE:  Vitals: 141/81 mmHg, 91 bpm  TODAY'S TREATMENT: 08/17/22 Activity Comments  NU-step level 5 x 6.5 min 3.5 min at level 5 w/ onset of LE symptoms (calves/knees) 3 min at level 5 w/ onset of symptoms and rates 14/20 RPE  HEP  -Feet together EO/EC x 30 sec -semi-tandem 2x15 sec  Pt education in PRE of strength vs endurance and relevance to training/HEP development   LAQ 2x15 reps 5 lbs--cues in form correction throughout, e.g. pace/iso hold  Alt. March 2x15 reps  5 lbs-- 60 sec rest period btwn set  Sidestepping x 2 min 5 lbs    PATIENT EDUCATION: Education details: assessment findings and HEP initiation Person educated: Patient Education method: Explanation Education comprehension: verbalized understanding  HOME EXERCISE PROGRAM: Access Code: [email or web address] URL: [email or web address] Date: 08/12/2022 Prepared by: Sherlyn Lees  Exercises - Corner Balance Feet Together With Eyes Open  - 1 x daily - 7 x weekly - 3 sets - 30 sec hold - Corner Balance Feet Together With Eyes Closed  - 1 x daily - 7 x weekly - 3 sets - 30 sec hold - Semi-Tandem Corner Balance With Eyes Open  - 1  x daily - 7 x weekly - 3 sets - 15 sec hold - Seated Long Arc Quad with Ankle Weight  - 1 x daily - 7 x weekly - 3 sets - 15-18 reps - 2 sec  hold - Standing Hip Flexion  - 1 x daily - 7 x weekly - 3 sets - 15-18 reps - Side Stepping with Counter Support  - 1 x daily - 7 x weekly - 1-3 sets - 2 min rounds hold   DIAGNOSTIC FINDINGS:   COGNITION: Overall cognitive status: Within functional limits for tasks assessed   SENSATION: Not tested  COORDINATION: WFL  EDEMA:  None present  MUSCLE TONE: NT  MUSCLE LENGTH: NT  DTRs:  NT  POSTURE: No Significant postural limitations  LOWER EXTREMITY ROM:     Active  Right Eval Left Eval  Hip flexion    Hip extension    Hip abduction    Hip adduction    Hip internal rotation    Hip external rotation    Knee flexion    Knee extension    Ankle dorsiflexion    Ankle plantarflexion    Ankle inversion    Ankle eversion     (Blank rows = not tested)  LOWER EXTREMITY MMT:    MMT Right Eval Left Eval  Hip flexion    Hip extension    Hip abduction    Hip adduction    Hip internal rotation    Hip external rotation    Knee flexion    Knee extension    Ankle dorsiflexion    Ankle plantarflexion    Ankle inversion    Ankle eversion    (Blank rows = not tested)  BED MOBILITY:  DNT but pt reports incr difficulty with bed mobility  TRANSFERS: Assistive device utilized: None  Sit to stand: Complete Independence and Modified independence Stand to sit: Complete Independence and Modified independence Chair to chair: Complete Independence and Modified independence Floor: Modified independence and SBA, requires UE support on furniture. Unable to bend over to pick up item from ground. Kicks object over towards object that he can use of UE support and assumes half-kneeling  RAMP:  NT  CURB:  Level of Assistance: Modified independence and SBA Assistive device utilized: None Curb Comments: must stop and "size up" the curb  STAIRS: Level of Assistance: Modified independence Stair Negotiation Technique: Alternating Pattern  with Bilateral Rails Number of  Stairs: 12  Height of Stairs: 4-6"  Comments:   GAIT: Gait pattern:  decr pelvic rotation and decreased trunk rotation, ankles maintained in DF throughout initial contact and loading response Distance walked: 315 (2MWT) Assistive device utilized: None Level of assistance: Complete Independence and SBA Comments: unsteady during turns  FUNCTIONAL TESTS:  5 times sit to stand: 13.44 Timed up and go (TUG): 14.75 2 minute walk test: vitals at start 98%, 94 bpm 315 ft and rates 13/20 Borg exertion (somewhat hard) 97% and 110 bpm Berg Balance Scale: 37/56 2.6 ft/sec speed based on 2MWT performance  M-CTSIB  Condition 1: Firm Surface, EO 30 Sec, Normal and Mild Sway  Condition 2: Firm Surface, EC 30 Sec, Normal and Mild Sway  Condition 3: Foam Surface, EO 30 Sec, Moderate and Severe Sway  Condition 4: Foam Surface, EC 4 Sec, Moderate and Severe Sway          GOALS: Goals reviewed with patient? Yes  SHORT TERM GOALS: Target date: 09/02/2022    Independent with HEP to improve functional  outcomes Baseline:initiated Goal status: IN PROGRESS  2.  Teach-back principles of exercise for improved muscular endurance in order to combat effects of vascular claudication Baseline:  Goal status: IN PROGRESS  3.  Demonstrate improved safety with gait/mobility per time of 10 sec TUG test Baseline: 13 sec Goal status: IN PROGRESS    LONG TERM GOALS: Target date: 09/23/2022    Demonstrate low risk for falls per score 50/56 Berg Balance Test Baseline: 37/56 Goal status: IN PROGRESS  2.  Manifest improved gait tolerance/endurance per distance of 470 ft during 2MWT with RPE not exceeding 11/20 Baseline: 315 ft w/ RPE 13/20 Borg Goal status: IN PROGRESS  3.  Independent with advanced HEP to address patient's range of deficits/limitations Baseline:  Goal status: IN PROGRESS  4. Improve body mechanics/flexibility to enable bending/stooping/squatting to pick up items from  ground  Baseline: Unable  Goal status: IN PROGRESS  ASSESSMENT:  CLINICAL IMPRESSION: Demonstrates onset of claudication-like symptoms in BLE with cardiovascular training on NU-step with onset of symptoms around 2-3 min w/ RPE of 14/20 Borg exertion. Initiated training for improved muscular endurance/efficiency to improve overall activities. Continued sessions to improve balance, strength, endurance, activity tolerance for enhanced mobility  OBJECTIVE IMPAIRMENTS: Abnormal gait, cardiopulmonary status limiting activity, decreased activity tolerance, decreased endurance, decreased knowledge of use of DME, decreased mobility, difficulty walking, decreased strength, impaired flexibility, and improper body mechanics.   ACTIVITY LIMITATIONS: carrying, lifting, bending, squatting, transfers, bed mobility, reach over head, and locomotion level  PARTICIPATION LIMITATIONS: meal prep, cleaning, laundry, interpersonal relationship, community activity, and yard work  PERSONAL FACTORS: Age, Fitness, Past/current experiences, Time since onset of injury/illness/exacerbation, and interaction of conditions  are also affecting patient's functional outcome.   REHAB POTENTIAL: Good  CLINICAL DECISION MAKING: Evolving/moderate complexity  EVALUATION COMPLEXITY: Moderate  PLAN:  PT FREQUENCY: 2x/week  PT DURATION: 6 weeks  PLANNED INTERVENTIONS: Therapeutic exercises, Therapeutic activity, Neuromuscular re-education, Balance training, Gait training, Patient/Family education, Self Care, Joint mobilization, Stair training, Vestibular training, Canalith repositioning, DME instructions, Aquatic Therapy, Dry Needling, Electrical stimulation, Spinal mobilization, Cryotherapy, Moist heat, Taping, Traction, Ionotophoresis 11m/ml Dexamethasone, and Manual therapy  PLAN FOR NEXT SESSION: review corner balance, assess bed mobility (maybe add some stretching/flexibility activities while visiting this), progressive  resistance exercises for muscular endurance   8:45 AM, 08/17/22 M. KSherlyn Lees PT, DPT Physical Therapist- CRusoOffice Number: [phone]

## 2022-08-20 NOTE — Therapy (Signed)
OUTPATIENT PHYSICAL THERAPY NEURO TREATMENT   Patient Name: Christian Bailey MRN: [id number] DOB:[birth year], 82 y.o., male Today's Date: 08/21/2022   PCP: Prince Solian, MD REFERRING PROVIDER: Prince Solian, MD  END OF SESSION:  PT End of Session - 08/21/22 1147     Visit Number 3    Number of Visits 12    Date for PT Re-Evaluation 09/23/22    Authorization Type Medicare A&B / [employer] for life    Progress Note Due on Visit 10    PT Start Time 1105    PT Stop Time 1145    PT Time Calculation (min) 40 min    Equipment Utilized During Treatment Gait belt    Activity Tolerance Patient tolerated treatment well    Behavior During Therapy WFL for tasks assessed/performed              Past Medical History:  Diagnosis Date   Anemia    Complication of anesthesia    Diabetes mellitus ([place])    TYPE 2   GERD (gastroesophageal reflux disease)    History of blood transfusion    GI bleed   History of colon polyps    History of hiatal hernia    Hyperlipemia    Hypertension    Osteoarthritis    PAD (peripheral artery disease) ([place])    a. stenting of his left common iliac artery >20 years ago. b. h/o LEIA stent and 2 stents to R SFA in 2011. c. 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 Noted during admission 04/2014 but upon review seems to be frequent finding for patient.   Stenosis of carotid artery    a. 50% right carotid stenosis by angiogram 04/2014.   Past Surgical History:  Procedure Laterality Date   ABDOMINAL AORTOGRAM W/LOWER EXTREMITY N/A 01/27/2017   Procedure: Abdominal Aortogram w/Lower Extremity;  Surgeon: Serafina Mitchell, MD;  Location: [place] [facility];  Service: Cardiovascular;  Laterality: N/A;   ABDOMINAL AORTOGRAM W/LOWER EXTREMITY N/A 06/08/2017   Procedure: ABDOMINAL AORTOGRAM W/LOWER EXTREMITY;  Surgeon: Serafina Mitchell, MD;  Location: [place] [facility];  Service: Cardiovascular;  Laterality: N/A;   ABDOMINAL AORTOGRAM W/LOWER EXTREMITY N/A 08/31/2017   Procedure: ABDOMINAL AORTOGRAM W/LOWER EXTREMITY;  Surgeon: Serafina Mitchell, MD;  Location: [place] [facility];  Service: Cardiovascular;  Laterality: N/A;  rt. unilateral   ANGIOPLASTY / STENTING FEMORAL     ANGIOPLASTY / STENTING ILIAC     AORTA - BILATERAL FEMORAL ARTERY BYPASS GRAFT N/A 07/06/2018   Procedure: AORTA BIFEMORAL BYPASS GRAFT USING 14X7MM X 40CM HEMASHIELD GOLD GRAFT;  Surgeon: Serafina Mitchell, MD;  Location: [place];  Service: Vascular;  Laterality: N/A;   CEREBRAL ANGIOGRAM N/A 05/14/2014   Procedure: CEREBRAL ANGIOGRAM;  Surgeon: Lorretta Harp, MD;  Location: [facility];  Service: Cardiovascular;  Laterality: N/A;   COLONOSCOPY W/ POLYPECTOMY     ENDARTERECTOMY Right 09/08/2017   ENDARTERECTOMY FEMORAL Right 09/08/2017   Procedure: REDO RIGHT FEMORAL ENDARTECTOMY WITH PATCH ANGIOPLASTY.;  Surgeon: Serafina Mitchell, MD;  Location: [facility];  Service: Vascular;  Laterality: Right;   EYE SURGERY  Bilateral    cataract   FEMORAL ARTERY STENT Right 05/12/2010   Stented distally with a 6x100 Abbott absolute stent and proximally with a 6x60 Cook Zilver stent resulting in the reduction of the proximal segment 80% and mid segment 60-70% to 0% residual, LEFT common femoral artery stented with a 7x3 Smart stent resulting in reduction of 90% stenosis to 0% residual   FEMORAL-POPLITEAL BYPASS GRAFT Right 09/18/2016   Procedure: BYPASS GRAFT FEMORAL-POPLITEAL ARTERY;  Surgeon: Serafina Mitchell, MD;  Location: [facility];  Service: Vascular;  Laterality: Right;   FEMORAL-POPLITEAL BYPASS GRAFT Right 09/08/2017   Procedure: REDO BYPASS GRAFT  FEMORAL-POPLITEAL ARTERY;  Surgeon: Serafina Mitchell, MD;  Location: [facility];  Service: Vascular;  Laterality: Right;   FEMORAL-POPLITEAL BYPASS GRAFT Right 07/06/2018   Procedure: REVISION RIGHT FEMORAL TO POPLITEAL ARTERY BYPASS GRAFT;  Surgeon: Serafina Mitchell, MD;  Location: [place];  Service: Vascular;  Laterality: Right;   IR CHOLANGIOGRAM EXISTING TUBE  08/04/2018   IR EXCHANGE BILIARY DRAIN  01/22/2019   IR PERC CHOLECYSTOSTOMY  07/19/2018   IR RADIOLOGIST EVAL & MGMT  08/31/2018   IR RADIOLOGIST EVAL & MGMT  01/17/2019   IR RADIOLOGIST EVAL & MGMT  02/01/2019   IR RADIOLOGIST EVAL & MGMT  02/08/2019   KNEE ARTHROSCOPY     left   LOWER EXTREMITY ANGIOGRAM N/A 05/14/2014   Procedure: LOWER EXTREMITY ANGIOGRAM;  Surgeon: Lorretta Harp, MD;  Location: [facility] CATH LAB;  Service: Cardiovascular;  Laterality: N/A;   LOWER EXTREMITY ANGIOGRAPHY N/A 09/21/2016   Procedure: Lower Extremity Angiography;  Surgeon: Conrad Bronaugh, MD;  Location: [place] [facility];  Service: Cardiovascular;  Laterality: N/A;   PERIPHERAL VASCULAR ATHERECTOMY Right 01/27/2017   Procedure: PERIPHERAL VASCULAR ATHERECTOMY;  Surgeon: Serafina Mitchell, MD;  Location: [place] [facility];  Service: Cardiovascular;  Laterality: Right;   PERIPHERAL VASCULAR BALLOON ANGIOPLASTY Right 06/08/2017   Procedure: PERIPHERAL VASCULAR BALLOON ANGIOPLASTY;  Surgeon: Serafina Mitchell, MD;  Location: [place] [facility];  Service: Cardiovascular;  Laterality: Right;  common femoral and superficial femoral arteries   PERIPHERAL VASCULAR CATHETERIZATION N/A 04/20/2016   Procedure: Lower Extremity Intervention;  Surgeon: Lorretta Harp, MD;  Location: [place] [facility];  Service: Cardiovascular;  Laterality: N/A;   PERIPHERAL VASCULAR INTERVENTION  08/31/2017   Procedure: PERIPHERAL VASCULAR INTERVENTION;  Surgeon: Serafina Mitchell, MD;  Location: [place] [facility];  Service: Cardiovascular;;  REIA   REVERSE SHOULDER ARTHROPLASTY Right 12/07/2016    REVERSE SHOULDER ARTHROPLASTY Right 12/07/2016   Procedure: REVERSE SHOULDER ARTHROPLASTY;  Surgeon: Netta Cedars, MD;  Location: [place];  Service: Orthopedics;  Laterality: Right;   ROTATOR CUFF REPAIR Right 2003   SFA Right 05/14/2014   PTA  OF RT SFA         DR BERRY   Patient Active Problem List   Diagnosis Date Noted   Exudative age-related macular degeneration of left eye with inactive choroidal neovascularization ([place]) 03/25/2020   Exudative age-related macular degeneration of right eye with inactive choroidal neovascularization ([place]) 03/25/2020   Advanced nonexudative age-related macular degeneration of right eye with subfoveal involvement 03/25/2020   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 involvement 03/25/2020   Bacteremia due to Gram-negative bacteria 01/19/2019   CKD (chronic kidney disease) stage 3, GFR 30-59 ml/min ([facility]) 01/19/2019   Stenosis of infrarenal abdominal aorta due to atherosclerosis ([place]) 07/06/2018   S/P shoulder replacement, right 12/07/2016   PVD (peripheral  vascular disease) ([place]) 09/18/2016   Sinus tachycardia 05/15/2014   Carotid artery disease ([place]) 04/26/2014   PAD (peripheral artery disease) ([place]) 06/12/2013   RBBB (right bundle branch block with left anterior fascicular block) 06/12/2013   Claudication ([place]) 12/14/2012   Essential hypertension 12/14/2012   Hyperlipidemia 12/14/2012   Type 2 diabetes mellitus ([place]) 12/14/2012   Personal history of colonic polyps 01/25/2012   DM 08/11/2010   DUODENAL ULCER, ACUTE, HEMORRHAGE 08/11/2010    ONSET DATE: "quite some time"  REFERRING DIAG: R26.81 (ICD-10-CM) - Unsteadiness on feet  THERAPY DIAG:  Unsteadiness on feet  Difficulty in walking, not elsewhere classified  Other abnormalities of gait and mobility  Rationale for Evaluation and Treatment: Rehabilitation  SUBJECTIVE:                                                                                                                                                                                              SUBJECTIVE STATEMENT: Reports muscle soreness from middle thighs down his legs after last session.   Pt accompanied by: self  PERTINENT HISTORY: hypertension, hyperlipidemia and noninsulin-requiring diabetes, legally blind  PAIN:  Are you having pain? No  PRECAUTIONS: Fall  WEIGHT BEARING RESTRICTIONS: No  FALLS: Has patient fallen in last 6 months? Yes. Number of falls 3  LIVING ENVIRONMENT: Lives with: lives with their family and lives with their spouse Lives in: House/apartment Stairs: Yes: Internal: yes but not used steps; bilateral but cannot reach both and External: 3 steps; bilateral but cannot reach both Has following equipment at home: [place] - 4 wheeled  PLOF: Independent  PATIENT GOALS: improve balance, stamina  OBJECTIVE:    TODAY'S TREATMENT: 08/21/22 Activity Comments  Nustep L2 x 6 min Les  Cueing to maintain moderate pace to avoid excess fatigue. Did not use Ues d/t c/o "bad shoulder"  bed mobility including sit<>supine and R/L rolling  Practiced several times with cueing to reach across body and log roll and to roll onto 1 side, push off hand and elbow to sit up; pt reported improved ease of movement   HS stretch strap 30" HS length very limited thus difficult to tolerate positioning   SKTC with strap + knee extension to tolerance 30" Improved tolerance   fig 4/KTOS 30" each Good tolerance after modifications provided  mini squat 10x Holding on with L hand only d/t limited R shoulder ROM  Wall squat  Much improved form and tolerance compared to mini squats      HOME EXERCISE PROGRAM Last updated: 08/21/22 Access Code: [email or web address] URL: [email or web address] Date: 08/21/2022 Prepared by: [facility] - Outpatient  Rehab -  [facility]  Exercises - Corner Balance Feet Together With Eyes Open  - 1 x daily - 7 x weekly - 3 sets - 30 sec  hold - Corner Balance Feet Together With Eyes Closed  - 1 x daily - 7 x weekly - 3 sets - 30 sec hold - Semi-Tandem Corner Balance With Eyes Open  - 1 x daily - 7 x weekly - 3 sets - 15 sec hold - Seated Long Arc Quad with Ankle Weight  - 1 x daily - 7 x weekly - 3 sets - 15-18 reps - 2 sec hold - Standing Hip Flexion  - 1 x daily - 7 x weekly - 3 sets - 15-18 reps - Side Stepping with Counter Support  - 1 x daily - 7 x weekly - 1-3 sets - 2 min rounds hold - Wall Squat  - 1 x daily - 5 x weekly - 2 sets - 10 reps   PATIENT EDUCATION: Education details: HEP update Person educated: Patient Education method: Explanation, Demonstration, Tactile cues, Verbal cues, and Handouts Education comprehension: verbalized understanding and returned demonstration   Below measures were taken at time of initial evaluation unless otherwise specified:  DIAGNOSTIC FINDINGS:   COGNITION: Overall cognitive status: Within functional limits for tasks assessed   SENSATION: Not tested  COORDINATION: WFL  EDEMA:  None present  MUSCLE TONE: NT  MUSCLE LENGTH: NT  DTRs:  NT  POSTURE: No Significant postural limitations  LOWER EXTREMITY ROM:     Active  Right Eval Left Eval  Hip flexion    Hip extension    Hip abduction    Hip adduction    Hip internal rotation    Hip external rotation    Knee flexion    Knee extension    Ankle dorsiflexion    Ankle plantarflexion    Ankle inversion    Ankle eversion     (Blank rows = not tested)  LOWER EXTREMITY MMT:    MMT Right Eval Left Eval  Hip flexion    Hip extension    Hip abduction    Hip adduction    Hip internal rotation    Hip external rotation    Knee flexion    Knee extension    Ankle dorsiflexion    Ankle plantarflexion    Ankle inversion    Ankle eversion    (Blank rows = not tested)  BED MOBILITY:  DNT but pt reports incr difficulty with bed mobility  TRANSFERS: Assistive device utilized: None  Sit to stand:  Complete Independence and Modified independence Stand to sit: Complete Independence and Modified independence Chair to chair: Complete Independence and Modified independence Floor: Modified independence and SBA, requires UE support on furniture. Unable to bend over to pick up item from ground. Kicks object over towards object that he can use of UE support and assumes half-kneeling  RAMP:  NT  CURB:  Level of Assistance: Modified independence and SBA Assistive device utilized: None Curb Comments: must stop and "size up" the curb  STAIRS: Level of Assistance: Modified independence Stair Negotiation Technique: Alternating Pattern  with Bilateral Rails Number of Stairs: 12  Height of Stairs: 4-6"  Comments:   GAIT: Gait pattern:  decr pelvic rotation and decreased trunk rotation, ankles maintained in DF throughout initial contact and loading response Distance walked: 315 (2MWT) Assistive device utilized: None Level of assistance: Complete Independence and SBA Comments: unsteady during turns  FUNCTIONAL TESTS:  5 times sit to stand:  13.44 Timed up and go (TUG): 14.75 2 minute walk test: vitals at start 98%, 94 bpm 315 ft and rates 13/20 Borg exertion (somewhat hard) 97% and 110 bpm Berg Balance Scale: 37/56 2.6 ft/sec speed based on 2MWT performance  M-CTSIB  Condition 1: Firm Surface, EO 30 Sec, Normal and Mild Sway  Condition 2: Firm Surface, EC 30 Sec, Normal and Mild Sway  Condition 3: Foam Surface, EO 30 Sec, Moderate and Severe Sway  Condition 4: Foam Surface, EC 4 Sec, Moderate and Severe Sway          GOALS: Goals reviewed with patient? Yes  SHORT TERM GOALS: Target date: 09/02/2022    Independent with HEP to improve functional outcomes Baseline:initiated Goal status: IN PROGRESS  2.  Teach-back principles of exercise for improved muscular endurance in order to combat effects of vascular claudication Baseline:  Goal status: IN PROGRESS  3.  Demonstrate  improved safety with gait/mobility per time of 10 sec TUG test Baseline: 13 sec Goal status: IN PROGRESS    LONG TERM GOALS: Target date: 09/23/2022    Demonstrate low risk for falls per score 50/56 Berg Balance Test Baseline: 37/56 Goal status: IN PROGRESS  2.  Manifest improved gait tolerance/endurance per distance of 470 ft during 2MWT with RPE not exceeding 11/20 Baseline: 315 ft w/ RPE 13/20 Borg Goal status: IN PROGRESS  3.  Independent with advanced HEP to address patient's range of deficits/limitations Baseline:  Goal status: IN PROGRESS  4. Improve body mechanics/flexibility to enable bending/stooping/squatting to pick up items from ground  Baseline: Unable  Goal status: IN PROGRESS  ASSESSMENT:  CLINICAL IMPRESSION: Patient arrived to session with report of soreness in LEs from thighs down since last session. No pain currently. Assessed bed mobility which revealed considerable difficulty. Patient reported much improved ease of movement after cueing and practice. Worked on stretching activities which revealed significant limitation in muscle length and joint mobility- will require increased practice and assistance with best positioning. Able to tolerate wall squats quite well today, thus updated into HEP. Patient reported understanding of all edu provided and without complaints at end of session.    OBJECTIVE IMPAIRMENTS: Abnormal gait, cardiopulmonary status limiting activity, decreased activity tolerance, decreased endurance, decreased knowledge of use of DME, decreased mobility, difficulty walking, decreased strength, impaired flexibility, and improper body mechanics.   ACTIVITY LIMITATIONS: carrying, lifting, bending, squatting, transfers, bed mobility, reach over head, and locomotion level  PARTICIPATION LIMITATIONS: meal prep, cleaning, laundry, interpersonal relationship, community activity, and yard work  PERSONAL FACTORS: Age, Fitness, Past/current experiences,  Time since onset of injury/illness/exacerbation, and interaction of conditions  are also affecting patient's functional outcome.   REHAB POTENTIAL: Good  CLINICAL DECISION MAKING: Evolving/moderate complexity  EVALUATION COMPLEXITY: Moderate  PLAN:  PT FREQUENCY: 2x/week  PT DURATION: 6 weeks  PLANNED INTERVENTIONS: Therapeutic exercises, Therapeutic activity, Neuromuscular re-education, Balance training, Gait training, Patient/Family education, Self Care, Joint mobilization, Stair training, Vestibular training, Canalith repositioning, DME instructions, Aquatic Therapy, Dry Needling, Electrical stimulation, Spinal mobilization, Cryotherapy, Moist heat, Taping, Traction, Ionotophoresis '4mg'$ /ml Dexamethasone, and Manual therapy  PLAN FOR NEXT SESSION: review corner balance, review/reassess bed mobility, progressive resistance exercises for muscular endurance     Janene Harvey, PT, DPT 08/21/22 11:50 AM  [facility] at [facility] [address] [place], [place] [address] Phone # [phone] Fax # [phone]

## 2022-08-21 ENCOUNTER — Encounter: Payer: Self-pay | Admitting: Physical Therapy

## 2022-08-21 ENCOUNTER — Ambulatory Visit: Payer: Medicare Other | Admitting: Physical Therapy

## 2022-08-21 DIAGNOSIS — R2689 Other abnormalities of gait and mobility: Secondary | ICD-10-CM | POA: Diagnosis not present

## 2022-08-21 DIAGNOSIS — R2681 Unsteadiness on feet: Secondary | ICD-10-CM | POA: Diagnosis not present

## 2022-08-21 DIAGNOSIS — R262 Difficulty in walking, not elsewhere classified: Secondary | ICD-10-CM

## 2022-08-24 ENCOUNTER — Ambulatory Visit: Payer: Medicare Other

## 2022-08-24 DIAGNOSIS — R2681 Unsteadiness on feet: Secondary | ICD-10-CM

## 2022-08-24 DIAGNOSIS — R262 Difficulty in walking, not elsewhere classified: Secondary | ICD-10-CM | POA: Diagnosis not present

## 2022-08-24 DIAGNOSIS — R2689 Other abnormalities of gait and mobility: Secondary | ICD-10-CM | POA: Diagnosis not present

## 2022-08-24 NOTE — Therapy (Signed)
OUTPATIENT PHYSICAL THERAPY NEURO TREATMENT   Patient Name: Christian Bailey MRN: [id number] DOB:[birth year], 82 y.o., male Today's Date: 08/24/2022   PCP: Prince Solian, MD REFERRING PROVIDER: Prince Solian, MD  END OF SESSION:  PT End of Session - 08/24/22 0846     Visit Number 4    Number of Visits 12    Date for PT Re-Evaluation 09/23/22    Authorization Type Medicare A&B / Tric[employer] for life    Progress Note Due on Visit 10    PT Start Time 0845    PT Stop Time 0930    PT Time Calculation (min) 45 min    Equipment Utilized During Treatment Gait belt    Activity Tolerance Patient tolerated treatment well    Behavior During Therapy WFL for tasks assessed/performed              Past Medical History:  Diagnosis Date   Anemia    Complication of anesthesia    Diabetes mellitus ([place])    TYPE 2   GERD (gastroesophageal reflux disease)    History of blood transfusion    GI bleed   History of colon polyps    History of hiatal hernia    Hyperlipemia    Hypertension    Osteoarthritis    PAD (peripheral artery disease) ([place])    a. stenting of his left common iliac artery >20 years ago. b. h/o LEIA stent and 2 stents to R SFA in 2011. c. 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 Noted during admission 04/2014 but upon review seems to be frequent finding for patient.   Stenosis of carotid artery    a. 50% right carotid stenosis by angiogram 04/2014.   Past Surgical History:  Procedure Laterality Date   ABDOMINAL AORTOGRAM W/LOWER EXTREMITY N/A 01/27/2017   Procedure: Abdominal Aortogram w/Lower Extremity;  Surgeon: Serafina Mitchell, MD;  Location: [facility];  Service: Cardiovascular;  Laterality: N/A;   ABDOMINAL AORTOGRAM W/LOWER EXTREMITY N/A 06/08/2017   Procedure: ABDOMINAL AORTOGRAM W/LOWER EXTREMITY;  Surgeon: Serafina Mitchell, MD;  Location: [facility];  Service: Cardiovascular;  Laterality: N/A;   ABDOMINAL AORTOGRAM W/LOWER EXTREMITY N/A 08/31/2017   Procedure: ABDOMINAL AORTOGRAM W/LOWER EXTREMITY;  Surgeon: Serafina Mitchell, MD;  Location: [facility];  Service: Cardiovascular;  Laterality: N/A;  rt. unilateral   ANGIOPLASTY / STENTING FEMORAL     ANGIOPLASTY / STENTING ILIAC     AORTA - BILATERAL FEMORAL ARTERY BYPASS GRAFT N/A 07/06/2018   Procedure: AORTA BIFEMORAL BYPASS GRAFT USING 14X7MM X 40CM HEMASHIELD GOLD GRAFT;  Surgeon: Serafina Mitchell, MD;  Location: [facility];  Service: Vascular;  Laterality: N/A;   CEREBRAL ANGIOGRAM N/A 05/14/2014   Procedure: CEREBRAL ANGIOGRAM;  Surgeon: Lorretta Harp, MD;  Location: [facility];  Service: Cardiovascular;  Laterality: N/A;   COLONOSCOPY W/ POLYPECTOMY     ENDARTERECTOMY Right 09/08/2017   ENDARTERECTOMY FEMORAL Right 09/08/2017   Procedure: REDO RIGHT FEMORAL ENDARTECTOMY WITH PATCH ANGIOPLASTY.;  Surgeon: Serafina Mitchell, MD;  Location: [facility];  Service: Vascular;  Laterality: Right;   EYE SURGERY  Bilateral    cataract   FEMORAL ARTERY STENT Right 05/12/2010   Stented distally with a 6x100 Abbott absolute stent and proximally with a 6x60 Cook Zilver stent resulting in the reduction of the proximal segment 80% and mid segment 60-70% to 0% residual, LEFT common femoral artery stented with a 7x3 Smart stent resulting in reduction of 90% stenosis to 0% residual   FEMORAL-POPLITEAL BYPASS GRAFT Right 09/18/2016   Procedure: BYPASS GRAFT FEMORAL-POPLITEAL ARTERY;  Surgeon: Serafina Mitchell, MD;  Location: [facility];  Service: Vascular;  Laterality: Right;   FEMORAL-POPLITEAL BYPASS GRAFT Right 09/08/2017   Procedure: REDO BYPASS GRAFT  FEMORAL-POPLITEAL ARTERY;  Surgeon: Serafina Mitchell, MD;  Location: [facility];  Service: Vascular;  Laterality: Right;   FEMORAL-POPLITEAL BYPASS GRAFT Right 07/06/2018   Procedure: REVISION RIGHT FEMORAL TO POPLITEAL ARTERY BYPASS GRAFT;  Surgeon: Serafina Mitchell, MD;  Location: [facility];  Service: Vascular;  Laterality: Right;   IR CHOLANGIOGRAM EXISTING TUBE  08/04/2018   IR EXCHANGE BILIARY DRAIN  01/22/2019   IR PERC CHOLECYSTOSTOMY  07/19/2018   IR RADIOLOGIST EVAL & MGMT  08/31/2018   IR RADIOLOGIST EVAL & MGMT  01/17/2019   IR RADIOLOGIST EVAL & MGMT  02/01/2019   IR RADIOLOGIST EVAL & MGMT  02/08/2019   KNEE ARTHROSCOPY     left   LOWER EXTREMITY ANGIOGRAM N/A 05/14/2014   Procedure: LOWER EXTREMITY ANGIOGRAM;  Surgeon: Lorretta Harp, MD;  Location: [facility] CATH LAB;  Service: Cardiovascular;  Laterality: N/A;   LOWER EXTREMITY ANGIOGRAPHY N/A 09/21/2016   Procedure: Lower Extremity Angiography;  Surgeon: Conrad , MD;  Location: [facility];  Service: Cardiovascular;  Laterality: N/A;   PERIPHERAL VASCULAR ATHERECTOMY Right 01/27/2017   Procedure: PERIPHERAL VASCULAR ATHERECTOMY;  Surgeon: Serafina Mitchell, MD;  Location: [facility];  Service: Cardiovascular;  Laterality: Right;   PERIPHERAL VASCULAR BALLOON ANGIOPLASTY Right 06/08/2017   Procedure: PERIPHERAL VASCULAR BALLOON ANGIOPLASTY;  Surgeon: Serafina Mitchell, MD;  Location: [facility];  Service: Cardiovascular;  Laterality: Right;  common femoral and superficial femoral arteries   PERIPHERAL VASCULAR CATHETERIZATION N/A 04/20/2016   Procedure: Lower Extremity Intervention;  Surgeon: Lorretta Harp, MD;  Location: [facility];  Service: Cardiovascular;  Laterality: N/A;   PERIPHERAL VASCULAR INTERVENTION  08/31/2017   Procedure: PERIPHERAL VASCULAR INTERVENTION;  Surgeon: Serafina Mitchell, MD;  Location: [facility];  Service: Cardiovascular;;  REIA   REVERSE SHOULDER ARTHROPLASTY Right 12/07/2016    REVERSE SHOULDER ARTHROPLASTY Right 12/07/2016   Procedure: REVERSE SHOULDER ARTHROPLASTY;  Surgeon: Netta Cedars, MD;  Location: [facility];  Service: Orthopedics;  Laterality: Right;   ROTATOR CUFF REPAIR Right 2003   SFA Right 05/14/2014   PTA  OF RT SFA         DR BERRY   Patient Active Problem List   Diagnosis Date Noted   Exudative age-related macular degeneration of left eye with inactive choroidal neovascularization ([place]) 03/25/2020   Exudative age-related macular degeneration of right eye with inactive choroidal neovascularization ([place]) 03/25/2020   Advanced nonexudative age-related macular degeneration of right eye with subfoveal involvement 03/25/2020   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 involvement 03/25/2020   Bacteremia due to Gram-negative bacteria 01/19/2019   CKD (chronic kidney disease) stage 3, GFR 30-59 ml/min ([facility]) 01/19/2019   Stenosis of infrarenal abdominal aorta due to atherosclerosis ([place]) 07/06/2018   S/P shoulder replacement, right 12/07/2016   PVD (peripheral  vascular disease) ([place]) 09/18/2016   Sinus tachycardia 05/15/2014   Carotid artery disease ([place]) 04/26/2014   PAD (peripheral artery disease) ([place]) 06/12/2013   RBBB (right bundle branch block with left anterior fascicular block) 06/12/2013   Claudication ([place]) 12/14/2012   Essential hypertension 12/14/2012   Hyperlipidemia 12/14/2012   Type 2 diabetes mellitus ([place]) 12/14/2012   Personal history of colonic polyps 01/25/2012   DM 08/11/2010   DUODENAL ULCER, ACUTE, HEMORRHAGE 08/11/2010    ONSET DATE: "quite some time"  REFERRING DIAG: R26.81 (ICD-10-CM) - Unsteadiness on feet  THERAPY DIAG:  Unsteadiness on feet  Difficulty in walking, not elsewhere classified  Other abnormalities of gait and mobility  Rationale for Evaluation and Treatment: Rehabilitation  SUBJECTIVE:                                                                                                                                                                                              SUBJECTIVE STATEMENT: Quiet weekend. Watching TV, playing on computer, etc  Pt accompanied by: self  PERTINENT HISTORY: hypertension, hyperlipidemia and noninsulin-requiring diabetes, legally blind  PAIN:  Are you having pain? No  PRECAUTIONS: Fall  WEIGHT BEARING RESTRICTIONS: No  FALLS: Has patient fallen in last 6 months? Yes. Number of falls 3  LIVING ENVIRONMENT: Lives with: lives with their family and lives with their spouse Lives in: House/apartment Stairs: Yes: Internal: yes but not used steps; bilateral but cannot reach both and External: 3 steps; bilateral but cannot reach both Has following equipment at home: [place] - 4 wheeled  PLOF: Independent  PATIENT GOALS: improve balance, stamina  OBJECTIVE:    TODAY'S TREATMENT: 08/24/22 Activity Comments  NU-step x 7.5 min Resistance intervals: 30 sec on/1 min off  Corner balance -feet together: EO/EC -semi-tandem: difficulty with balance  Wall squats 1x10 back to wall 1x10 w/ physioball  LAQ 3x12 2#  Standing hip flexion 3x10 2# at counter  Sidestepping x 2 min 2# at [employer] with stool for trunk flexion Very limited flexibliity          TODAY'S TREATMENT: 08/21/22 Activity Comments  Nustep L2 x 6 min Les  Cueing to maintain moderate pace to avoid excess fatigue. Did not use Ues d/t c/o "bad shoulder"  bed mobility including sit<>supine and R/L rolling  Practiced several times with cueing to reach across body and log roll and to roll onto 1 side, push off hand and elbow to sit up; pt reported improved ease of movement   HS stretch strap 30" HS length very limited thus difficult to tolerate positioning   SKTC with strap +  knee extension to tolerance 30" Improved tolerance   fig 4/KTOS 30" each Good tolerance after modifications provided  mini squat 10x Holding on with L hand only d/t limited R  shoulder ROM  Wall squat  Much improved form and tolerance compared to mini squats      HOME EXERCISE PROGRAM Last updated: 08/21/22 Access Code: [email or web address] URL: [email or web address] Date: 08/21/2022 Prepared by: [facility]  Exercises - Corner Balance Feet Together With Eyes Open  - 1 x daily - 7 x weekly - 3 sets - 30 sec hold - Corner Balance Feet Together With Eyes Closed  - 1 x daily - 7 x weekly - 3 sets - 30 sec hold - Semi-Tandem Corner Balance With Eyes Open  - 1 x daily - 7 x weekly - 3 sets - 15 sec hold - Seated Long Arc Quad with Ankle Weight  - 1 x daily - 7 x weekly - 3 sets - 15-18 reps - 2 sec hold - Standing Hip Flexion  - 1 x daily - 7 x weekly - 3 sets - 15-18 reps - Side Stepping with Counter Support  - 1 x daily - 7 x weekly - 1-3 sets - 2 min rounds hold - Wall Squat  - 1 x daily - 5 x weekly - 2 sets - 10 reps   PATIENT EDUCATION: Education details: HEP update Person educated: Patient Education method: Explanation, Demonstration, Tactile cues, Verbal cues, and Handouts Education comprehension: verbalized understanding and returned demonstration   Below measures were taken at time of initial evaluation unless otherwise specified:  DIAGNOSTIC FINDINGS:   COGNITION: Overall cognitive status: Within functional limits for tasks assessed   SENSATION: Not tested  COORDINATION: WFL  EDEMA:  None present  MUSCLE TONE: NT  MUSCLE LENGTH: NT  DTRs:  NT  POSTURE: No Significant postural limitations  LOWER EXTREMITY ROM:     Active  Right Eval Left Eval  Hip flexion    Hip extension    Hip abduction    Hip adduction    Hip internal rotation    Hip external rotation    Knee flexion    Knee extension    Ankle dorsiflexion    Ankle plantarflexion    Ankle inversion    Ankle eversion     (Blank rows = not tested)  LOWER EXTREMITY MMT:    MMT Right Eval Left Eval  Hip flexion    Hip  extension    Hip abduction    Hip adduction    Hip internal rotation    Hip external rotation    Knee flexion    Knee extension    Ankle dorsiflexion    Ankle plantarflexion    Ankle inversion    Ankle eversion    (Blank rows = not tested)  BED MOBILITY:  DNT but pt reports incr difficulty with bed mobility  TRANSFERS: Assistive device utilized: None  Sit to stand: Complete Independence and Modified independence Stand to sit: Complete Independence and Modified independence Chair to chair: Complete Independence and Modified independence Floor: Modified independence and SBA, requires UE support on furniture. Unable to bend over to pick up item from ground. Kicks object over towards object that he can use of UE support and assumes half-kneeling  RAMP:  NT  CURB:  Level of Assistance: Modified independence and SBA Assistive device utilized: None Curb Comments: must stop and "size up" the curb  STAIRS: Level of Assistance: Modified  independence Stair Negotiation Technique: Alternating Pattern  with Bilateral Rails Number of Stairs: 12  Height of Stairs: 4-6"  Comments:   GAIT: Gait pattern:  decr pelvic rotation and decreased trunk rotation, ankles maintained in DF throughout initial contact and loading response Distance walked: 315 (2MWT) Assistive device utilized: None Level of assistance: Complete Independence and SBA Comments: unsteady during turns  FUNCTIONAL TESTS:  5 times sit to stand: 13.44 Timed up and go (TUG): 14.75 2 minute walk test: vitals at start 98%, 94 bpm 315 ft and rates 13/20 Borg exertion (somewhat hard) 97% and 110 bpm Berg Balance Scale: 37/56 2.6 ft/sec speed based on 2MWT performance  M-CTSIB  Condition 1: Firm Surface, EO 30 Sec, Normal and Mild Sway  Condition 2: Firm Surface, EC 30 Sec, Normal and Mild Sway  Condition 3: Foam Surface, EO 30 Sec, Moderate and Severe Sway  Condition 4: Foam Surface, EC 4 Sec, Moderate and Severe Sway           GOALS: Goals reviewed with patient? Yes  SHORT TERM GOALS: Target date: 09/02/2022    Independent with HEP to improve functional outcomes Baseline:initiated Goal status: IN PROGRESS  2.  Teach-back principles of exercise for improved muscular endurance in order to combat effects of vascular claudication Baseline:  Goal status: IN PROGRESS  3.  Demonstrate improved safety with gait/mobility per time of 10 sec TUG test Baseline: 13 sec Goal status: IN PROGRESS    LONG TERM GOALS: Target date: 09/23/2022    Demonstrate low risk for falls per score 50/56 Berg Balance Test Baseline: 37/56 Goal status: IN PROGRESS  2.  Manifest improved gait tolerance/endurance per distance of 470 ft during 2MWT with RPE not exceeding 11/20 Baseline: 315 ft w/ RPE 13/20 Borg Goal status: IN PROGRESS  3.  Independent with advanced HEP to address patient's range of deficits/limitations Baseline:  Goal status: IN PROGRESS  4. Improve body mechanics/flexibility to enable bending/stooping/squatting to pick up items from ground  Baseline: Unable  Goal status: IN PROGRESS  ASSESSMENT:  CLINICAL IMPRESSION: Improved activity tolerance w/ NU-step not requiring rest period at all during the 7.5 min trial only modifying speed/intensity for active rest. Decreased sway with eyes closed condition with corner balance. Continued w/ muscular endurance activities using 2#. Continued sessions to advance POC details   OBJECTIVE IMPAIRMENTS: Abnormal gait, cardiopulmonary status limiting activity, decreased activity tolerance, decreased endurance, decreased knowledge of use of DME, decreased mobility, difficulty walking, decreased strength, impaired flexibility, and improper body mechanics.   ACTIVITY LIMITATIONS: carrying, lifting, bending, squatting, transfers, bed mobility, reach over head, and locomotion level  PARTICIPATION LIMITATIONS: meal prep, cleaning, laundry, interpersonal relationship,  community activity, and yard work  PERSONAL FACTORS: Age, Fitness, Past/current experiences, Time since onset of injury/illness/exacerbation, and interaction of conditions  are also affecting patient's functional outcome.   REHAB POTENTIAL: Good  CLINICAL DECISION MAKING: Evolving/moderate complexity  EVALUATION COMPLEXITY: Moderate  PLAN:  PT FREQUENCY: 2x/week  PT DURATION: 6 weeks  PLANNED INTERVENTIONS: Therapeutic exercises, Therapeutic activity, Neuromuscular re-education, Balance training, Gait training, Patient/Family education, Self Care, Joint mobilization, Stair training, Vestibular training, Canalith repositioning, DME instructions, Aquatic Therapy, Dry Needling, Electrical stimulation, Spinal mobilization, Cryotherapy, Moist heat, Taping, Traction, Ionotophoresis '4mg'$ /ml Dexamethasone, and Manual therapy  PLAN FOR NEXT SESSION: review corner balance, review/reassess bed mobility, progressive resistance exercises for muscular endurance  9:35 AM, 08/24/22 M. Sherlyn Lees, PT, DPT Physical Therapist- [place] Office Number: [phone]      [place] [facility]  Neuro [address], [place], [place] [address] Phone # [phone] Fax # [phone]

## 2022-08-28 ENCOUNTER — Ambulatory Visit: Payer: Medicare Other | Attending: Internal Medicine

## 2022-08-28 DIAGNOSIS — R262 Difficulty in walking, not elsewhere classified: Secondary | ICD-10-CM | POA: Insufficient documentation

## 2022-08-28 DIAGNOSIS — R2681 Unsteadiness on feet: Secondary | ICD-10-CM | POA: Diagnosis not present

## 2022-08-28 DIAGNOSIS — R2689 Other abnormalities of gait and mobility: Secondary | ICD-10-CM | POA: Insufficient documentation

## 2022-08-28 NOTE — Therapy (Signed)
OUTPATIENT PHYSICAL THERAPY NEURO TREATMENT   Patient Name: Christian Bailey MRN: [id number] DOB:Nov 03, 1940, 82 y.o., male Today's Date: 08/28/2022   PCP: Prince Solian, MD REFERRING PROVIDER: Prince Solian, MD  END OF SESSION:  PT End of Session - 08/28/22 1059     Visit Number 5    Number of Visits 12    Date for PT Re-Evaluation 09/23/22    Authorization Type Medicare A&B / [employer] for life    Progress Note Due on Visit 10    PT Start Time 1100    PT Stop Time 1145    PT Time Calculation (min) 45 min    Equipment Utilized During Treatment Gait belt    Activity Tolerance Patient tolerated treatment well    Behavior During Therapy WFL for tasks assessed/performed              Past Medical History:  Diagnosis Date   Anemia    Complication of anesthesia    Diabetes mellitus ([place])    TYPE 2   GERD (gastroesophageal reflux disease)    History of blood transfusion    GI bleed   History of colon polyps    History of hiatal hernia    Hyperlipemia    Hypertension    Osteoarthritis    PAD (peripheral artery disease) ([place])    a. stenting of his left common iliac artery >20 years ago. b. h/o LEIA stent and 2 stents to R SFA in 2011. c. 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 Noted during admission 04/2014 but upon review seems to be frequent finding for patient.   Stenosis of carotid artery    a. 50% right carotid stenosis by angiogram 04/2014.   Past Surgical History:  Procedure Laterality Date   ABDOMINAL AORTOGRAM W/LOWER EXTREMITY N/A 01/27/2017   Procedure: Abdominal Aortogram w/Lower Extremity;  Surgeon: Serafina Mitchell, MD;  Location: [facility];  Service: Cardiovascular;  Laterality: N/A;   ABDOMINAL AORTOGRAM W/LOWER EXTREMITY N/A 06/08/2017   Procedure: ABDOMINAL AORTOGRAM W/LOWER EXTREMITY;  Surgeon: Serafina Mitchell, MD;  Location: [facility];  Service: Cardiovascular;  Laterality: N/A;   ABDOMINAL AORTOGRAM W/LOWER EXTREMITY N/A 08/31/2017   Procedure: ABDOMINAL AORTOGRAM W/LOWER EXTREMITY;  Surgeon: Serafina Mitchell, MD;  Location: [facility];  Service: Cardiovascular;  Laterality: N/A;  rt. unilateral   ANGIOPLASTY / STENTING FEMORAL     ANGIOPLASTY / STENTING ILIAC     AORTA - BILATERAL FEMORAL ARTERY BYPASS GRAFT N/A 07/06/2018   Procedure: AORTA BIFEMORAL BYPASS GRAFT USING 14X7MM X 40CM HEMASHIELD GOLD GRAFT;  Surgeon: Serafina Mitchell, MD;  Location: [facility];  Service: Vascular;  Laterality: N/A;   CEREBRAL ANGIOGRAM N/A 05/14/2014   Procedure: CEREBRAL ANGIOGRAM;  Surgeon: Lorretta Harp, MD;  Location: [facility];  Service: Cardiovascular;  Laterality: N/A;   COLONOSCOPY W/ POLYPECTOMY     ENDARTERECTOMY Right 09/08/2017   ENDARTERECTOMY FEMORAL Right 09/08/2017   Procedure: REDO RIGHT FEMORAL ENDARTECTOMY WITH PATCH ANGIOPLASTY.;  Surgeon: Serafina Mitchell, MD;  Location: [facility];  Service: Vascular;  Laterality: Right;   EYE SURGERY  Bilateral    cataract   FEMORAL ARTERY STENT Right 05/12/2010   Stented distally with a 6x100 Abbott absolute stent and proximally with a 6x60 Cook Zilver stent resulting in the reduction of the proximal segment 80% and mid segment 60-70% to 0% residual, LEFT common femoral artery stented with a 7x3 Smart stent resulting in reduction of 90% stenosis to 0% residual   FEMORAL-POPLITEAL BYPASS GRAFT Right 09/18/2016   Procedure: BYPASS GRAFT FEMORAL-POPLITEAL ARTERY;  Surgeon: Serafina Mitchell, MD;  Location: [facility];  Service: Vascular;  Laterality: Right;   FEMORAL-POPLITEAL BYPASS GRAFT Right 09/08/2017   Procedure: REDO BYPASS GRAFT  FEMORAL-POPLITEAL ARTERY;  Surgeon: Serafina Mitchell, MD;  Location: [facility];  Service: Vascular;  Laterality: Right;   FEMORAL-POPLITEAL BYPASS GRAFT Right 07/06/2018   Procedure: REVISION RIGHT FEMORAL TO POPLITEAL ARTERY BYPASS GRAFT;  Surgeon: Serafina Mitchell, MD;  Location: [facility];  Service: Vascular;  Laterality: Right;   IR CHOLANGIOGRAM EXISTING TUBE  08/04/2018   IR EXCHANGE BILIARY DRAIN  01/22/2019   IR PERC CHOLECYSTOSTOMY  07/19/2018   IR RADIOLOGIST EVAL & MGMT  08/31/2018   IR RADIOLOGIST EVAL & MGMT  01/17/2019   IR RADIOLOGIST EVAL & MGMT  02/01/2019   IR RADIOLOGIST EVAL & MGMT  02/08/2019   KNEE ARTHROSCOPY     left   LOWER EXTREMITY ANGIOGRAM N/A 05/14/2014   Procedure: LOWER EXTREMITY ANGIOGRAM;  Surgeon: Lorretta Harp, MD;  Location: [facility];  Service: Cardiovascular;  Laterality: N/A;   LOWER EXTREMITY ANGIOGRAPHY N/A 09/21/2016   Procedure: Lower Extremity Angiography;  Surgeon: Conrad LaCoste, MD;  Location: [facility];  Service: Cardiovascular;  Laterality: N/A;   PERIPHERAL VASCULAR ATHERECTOMY Right 01/27/2017   Procedure: PERIPHERAL VASCULAR ATHERECTOMY;  Surgeon: Serafina Mitchell, MD;  Location: [facility];  Service: Cardiovascular;  Laterality: Right;   PERIPHERAL VASCULAR BALLOON ANGIOPLASTY Right 06/08/2017   Procedure: PERIPHERAL VASCULAR BALLOON ANGIOPLASTY;  Surgeon: Serafina Mitchell, MD;  Location: [facility];  Service: Cardiovascular;  Laterality: Right;  common femoral and superficial femoral arteries   PERIPHERAL VASCULAR CATHETERIZATION N/A 04/20/2016   Procedure: Lower Extremity Intervention;  Surgeon: Lorretta Harp, MD;  Location: [facility];  Service: Cardiovascular;  Laterality: N/A;   PERIPHERAL VASCULAR INTERVENTION  08/31/2017   Procedure: PERIPHERAL VASCULAR INTERVENTION;  Surgeon: Serafina Mitchell, MD;  Location: [facility];  Service: Cardiovascular;;  REIA   REVERSE SHOULDER ARTHROPLASTY Right 12/07/2016    REVERSE SHOULDER ARTHROPLASTY Right 12/07/2016   Procedure: REVERSE SHOULDER ARTHROPLASTY;  Surgeon: Netta Cedars, MD;  Location: [facility];  Service: Orthopedics;  Laterality: Right;   ROTATOR CUFF REPAIR Right 2003   SFA Right 05/14/2014   PTA  OF RT SFA         DR BERRY   Patient Active Problem List   Diagnosis Date Noted   Exudative age-related macular degeneration of left eye with inactive choroidal neovascularization ([place]) 03/25/2020   Exudative age-related macular degeneration of right eye with inactive choroidal neovascularization ([place]) 03/25/2020   Advanced nonexudative age-related macular degeneration of right eye with subfoveal involvement 03/25/2020   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 involvement 03/25/2020   Bacteremia due to Gram-negative bacteria 01/19/2019   CKD (chronic kidney disease) stage 3, GFR 30-59 ml/min ([facility]) 01/19/2019   Stenosis of infrarenal abdominal aorta due to atherosclerosis ([place]) 07/06/2018   S/P shoulder replacement, right 12/07/2016   PVD (peripheral  vascular disease) ([place]) 09/18/2016   Sinus tachycardia 05/15/2014   Carotid artery disease ([place]) 04/26/2014   PAD (peripheral artery disease) ([place]) 06/12/2013   RBBB (right bundle branch block with left anterior fascicular block) 06/12/2013   Claudication ([place]) 12/14/2012   Essential hypertension 12/14/2012   Hyperlipidemia 12/14/2012   Type 2 diabetes mellitus ([place]) 12/14/2012   Personal history of colonic polyps 01/25/2012   DM 08/11/2010   DUODENAL ULCER, ACUTE, HEMORRHAGE 08/11/2010    ONSET DATE: "quite some time"  REFERRING DIAG: R26.81 (ICD-10-CM) - Unsteadiness on feet  THERAPY DIAG:  Unsteadiness on feet  Difficulty in walking, not elsewhere classified  Other abnormalities of gait and mobility  Rationale for Evaluation and Treatment: Rehabilitation  SUBJECTIVE:                                                                                                                                                                                              SUBJECTIVE STATEMENT: Quiet weekend. Watching TV, playing on computer, etc  Pt accompanied by: self  PERTINENT HISTORY: hypertension, hyperlipidemia and noninsulin-requiring diabetes, legally blind  PAIN:  Are you having pain? No  PRECAUTIONS: Fall  WEIGHT BEARING RESTRICTIONS: No  FALLS: Has patient fallen in last 6 months? Yes. Number of falls 3  LIVING ENVIRONMENT: Lives with: lives with their family and lives with their spouse Lives in: House/apartment Stairs: Yes: Internal: yes but not used steps; bilateral but cannot reach both and External: 3 steps; bilateral but cannot reach both Has following equipment at home: Walker - 4 wheeled  PLOF: Independent  PATIENT GOALS: improve balance, stamina  OBJECTIVE:    TODAY'S TREATMENT: 08/28/22 Activity Comments  NU-step x 6 min  LE only due to Right shoulder limitations, no rest periods needed today.   LAQ 3x15 3#  Alt stair taps x 2 min 3#, 6" box for SLS  Sidestepping x 2 min 3#  Sit-stand 3x10 8# goblet hold, 24" seat height, blocking feet for knee flexion  Balance on firm/foam -EO/EC x 30 sec -head turns 5x EO/EC  Gastroc stretch x 2 min On slantboard  Retrowalking 4x45 ft   Seated hamstring stretch 2x10 deep breaths  Assisted straight leg h/s stretch 1x60 sec  DKTC assisted stretch X 2 min for hip flexion  Supine butterfly  X2 min for hip add stretch           HOME EXERCISE PROGRAM Last updated: 08/21/22 Access Code: [email or web address] URL: [email or web address] Date: 08/21/2022 Prepared by: [facility]  Exercises - Corner Balance Feet Together With Eyes Open  - 1 x daily -  7 x weekly - 3 sets - 30 sec hold - Corner Balance Feet Together With Eyes Closed  - 1 x daily - 7 x weekly - 3 sets - 30 sec hold - Semi-Tandem Corner Balance With Eyes Open  - 1 x daily -  7 x weekly - 3 sets - 15 sec hold - Seated Long Arc Quad with Ankle Weight  - 1 x daily - 7 x weekly - 3 sets - 15-18 reps - 2 sec hold - Standing Hip Flexion  - 1 x daily - 7 x weekly - 3 sets - 15-18 reps - Side Stepping with Counter Support  - 1 x daily - 7 x weekly - 1-3 sets - 2 min rounds hold - Wall Squat  - 1 x daily - 5 x weekly - 2 sets - 10 reps   PATIENT EDUCATION: Education details: HEP update Person educated: Patient Education method: Explanation, Demonstration, Tactile cues, Verbal cues, and Handouts Education comprehension: verbalized understanding and returned demonstration   Below measures were taken at time of initial evaluation unless otherwise specified:  DIAGNOSTIC FINDINGS:   COGNITION: Overall cognitive status: Within functional limits for tasks assessed   SENSATION: Not tested  COORDINATION: WFL  EDEMA:  None present  MUSCLE TONE: NT  MUSCLE LENGTH: NT  DTRs:  NT  POSTURE: No Significant postural limitations    BED MOBILITY:  DNT but pt reports incr difficulty with bed mobility  TRANSFERS: Assistive device utilized: None  Sit to stand: Complete Independence and Modified independence Stand to sit: Complete Independence and Modified independence Chair to chair: Complete Independence and Modified independence Floor: Modified independence and SBA, requires UE support on furniture. Unable to bend over to pick up item from ground. Kicks object over towards object that he can use of UE support and assumes half-kneeling    CURB:  Level of Assistance: Modified independence and SBA Assistive device utilized: None Curb Comments: must stop and "size up" the curb  STAIRS: Level of Assistance: Modified independence Stair Negotiation Technique: Alternating Pattern  with Bilateral Rails Number of Stairs: 12  Height of Stairs: 4-6"  Comments:   GAIT: Gait pattern:  decr pelvic rotation and decreased trunk rotation, ankles maintained in DF  throughout initial contact and loading response Distance walked: 315 (2MWT) Assistive device utilized: None Level of assistance: Complete Independence and SBA Comments: unsteady during turns  FUNCTIONAL TESTS:  5 times sit to stand: 13.44 Timed up and go (TUG): 14.75 2 minute walk test: vitals at start 98%, 94 bpm 315 ft and rates 13/20 Borg exertion (somewhat hard) 97% and 110 bpm Berg Balance Scale: 37/56 2.6 ft/sec speed based on 2MWT performance  M-CTSIB  Condition 1: Firm Surface, EO 30 Sec, Normal and Mild Sway  Condition 2: Firm Surface, EC 30 Sec, Normal and Mild Sway  Condition 3: Foam Surface, EO 30 Sec, Moderate and Severe Sway  Condition 4: Foam Surface, EC 4 Sec, Moderate and Severe Sway          GOALS: Goals reviewed with patient? Yes  SHORT TERM GOALS: Target date: 09/02/2022    Independent with HEP to improve functional outcomes Baseline:initiated Goal status: IN PROGRESS  2.  Teach-back principles of exercise for improved muscular endurance in order to combat effects of vascular claudication Baseline:  Goal status: IN PROGRESS  3.  Demonstrate improved safety with gait/mobility per time of 10 sec TUG test Baseline: 13 sec Goal status: IN PROGRESS    LONG TERM GOALS: Target  date: 09/23/2022    Demonstrate low risk for falls per score 50/56 Berg Balance Test Baseline: 37/56 Goal status: IN PROGRESS  2.  Manifest improved gait tolerance/endurance per distance of 470 ft during 2MWT with RPE not exceeding 11/20 Baseline: 315 ft w/ RPE 13/20 Borg Goal status: IN PROGRESS  3.  Independent with advanced HEP to address patient's range of deficits/limitations Baseline:  Goal status: IN PROGRESS  4. Improve body mechanics/flexibility to enable bending/stooping/squatting to pick up items from ground  Baseline: Unable  Goal status: IN PROGRESS  ASSESSMENT:  CLINICAL IMPRESSION: Marked improvement in activity tolerance today only requiring 2 seated  rest periods of 2 min duration and able to progress with increased repetition and weight without adverse effect.  Difficulty in maintaining balance on firm and compliant surfaces with eyes closed condition. Progressing very well with activities and moderately limited by symptoms of claudication in BLE which resolves with 2 min rest periods. Continued sessions to advance POC details   OBJECTIVE IMPAIRMENTS: Abnormal gait, cardiopulmonary status limiting activity, decreased activity tolerance, decreased endurance, decreased knowledge of use of DME, decreased mobility, difficulty walking, decreased strength, impaired flexibility, and improper body mechanics.   ACTIVITY LIMITATIONS: carrying, lifting, bending, squatting, transfers, bed mobility, reach over head, and locomotion level  PARTICIPATION LIMITATIONS: meal prep, cleaning, laundry, interpersonal relationship, community activity, and yard work  PERSONAL FACTORS: Age, Fitness, Past/current experiences, Time since onset of injury/illness/exacerbation, and interaction of conditions  are also affecting patient's functional outcome.   REHAB POTENTIAL: Good  CLINICAL DECISION MAKING: Evolving/moderate complexity  EVALUATION COMPLEXITY: Moderate  PLAN:  PT FREQUENCY: 2x/week  PT DURATION: 6 weeks  PLANNED INTERVENTIONS: Therapeutic exercises, Therapeutic activity, Neuromuscular re-education, Balance training, Gait training, Patient/Family education, Self Care, Joint mobilization, Stair training, Vestibular training, Canalith repositioning, DME instructions, Aquatic Therapy, Dry Needling, Electrical stimulation, Spinal mobilization, Cryotherapy, Moist heat, Taping, Traction, Ionotophoresis '4mg'$ /ml Dexamethasone, and Manual therapy  PLAN FOR NEXT SESSION: review corner balance, review/reassess bed mobility, progressive resistance exercises for muscular endurance  11:00 AM, 08/28/22 M. Sherlyn Lees, PT, DPT Physical Therapist- [place] Office  Number: [phone]      [facility] [facility] [address], [place], [place] [address] Phone # [phone] Fax # [phone]

## 2022-08-31 ENCOUNTER — Ambulatory Visit: Payer: Medicare Other | Admitting: Physical Therapy

## 2022-08-31 ENCOUNTER — Encounter: Payer: Self-pay | Admitting: Physical Therapy

## 2022-08-31 DIAGNOSIS — R262 Difficulty in walking, not elsewhere classified: Secondary | ICD-10-CM

## 2022-08-31 DIAGNOSIS — R2681 Unsteadiness on feet: Secondary | ICD-10-CM

## 2022-08-31 DIAGNOSIS — R2689 Other abnormalities of gait and mobility: Secondary | ICD-10-CM

## 2022-08-31 NOTE — Therapy (Signed)
OUTPATIENT PHYSICAL THERAPY NEURO TREATMENT   Patient Name: Christian Bailey MRN: [id number] DOB:May 06, 1941, 82 y.o., male Today's Date: 08/31/2022   PCP: Prince Solian, MD REFERRING PROVIDER: Prince Solian, MD  END OF SESSION:  PT End of Session - 08/31/22 0900     Visit Number 6    Number of Visits 12    Date for PT Re-Evaluation 09/23/22    Authorization Type Medicare A&B / [employer] for life    Progress Note Due on Visit 10    PT Start Time 0850    PT Stop Time 0928    PT Time Calculation (min) 38 min    Equipment Utilized During Treatment Gait belt    Activity Tolerance Patient tolerated treatment well    Behavior During Therapy WFL for tasks assessed/performed               Past Medical History:  Diagnosis Date   Anemia    Complication of anesthesia    Diabetes mellitus ([place])    TYPE 2   GERD (gastroesophageal reflux disease)    History of blood transfusion    GI bleed   History of colon polyps    History of hiatal hernia    Hyperlipemia    Hypertension    Osteoarthritis    PAD (peripheral artery disease) ([place])    a. stenting of his left common iliac artery >20 years ago. b. h/o LEIA stent and 2 stents to R SFA in 2011. c. 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 Noted during admission 04/2014 but upon review seems to be frequent finding for patient.   Stenosis of carotid artery    a. 50% right carotid stenosis by angiogram 04/2014.   Past Surgical History:  Procedure Laterality Date   ABDOMINAL AORTOGRAM W/LOWER EXTREMITY N/A 01/27/2017   Procedure: Abdominal Aortogram w/Lower Extremity;  Surgeon: Serafina Mitchell, MD;  Location: [place] [facility];  Service: Cardiovascular;  Laterality: N/A;   ABDOMINAL AORTOGRAM W/LOWER EXTREMITY N/A 06/08/2017   Procedure: ABDOMINAL AORTOGRAM W/LOWER EXTREMITY;  Surgeon: Serafina Mitchell, MD;  Location: [place] [facility];  Service: Cardiovascular;  Laterality: N/A;   ABDOMINAL AORTOGRAM W/LOWER EXTREMITY N/A 08/31/2017   Procedure: ABDOMINAL AORTOGRAM W/LOWER EXTREMITY;  Surgeon: Serafina Mitchell, MD;  Location: [facility];  Service: Cardiovascular;  Laterality: N/A;  rt. unilateral   ANGIOPLASTY / STENTING FEMORAL     ANGIOPLASTY / STENTING ILIAC     AORTA - BILATERAL FEMORAL ARTERY BYPASS GRAFT N/A 07/06/2018   Procedure: AORTA BIFEMORAL BYPASS GRAFT USING 14X7MM X 40CM HEMASHIELD GOLD GRAFT;  Surgeon: Serafina Mitchell, MD;  Location: [place];  Service: Vascular;  Laterality: N/A;   CEREBRAL ANGIOGRAM N/A 05/14/2014   Procedure: CEREBRAL ANGIOGRAM;  Surgeon: Lorretta Harp, MD;  Location: [facility];  Service: Cardiovascular;  Laterality: N/A;   COLONOSCOPY W/ POLYPECTOMY     ENDARTERECTOMY Right 09/08/2017   ENDARTERECTOMY FEMORAL Right 09/08/2017   Procedure: REDO RIGHT FEMORAL ENDARTECTOMY WITH PATCH ANGIOPLASTY.;  Surgeon: Serafina Mitchell, MD;  Location: [facility];  Service: Vascular;  Laterality: Right;   EYE  SURGERY Bilateral    cataract   FEMORAL ARTERY STENT Right 05/12/2010   Stented distally with a 6x100 Abbott absolute stent and proximally with a 6x60 Cook Zilver stent resulting in the reduction of the proximal segment 80% and mid segment 60-70% to 0% residual, LEFT common femoral artery stented with a 7x3 Smart stent resulting in reduction of 90% stenosis to 0% residual   FEMORAL-POPLITEAL BYPASS GRAFT Right 09/18/2016   Procedure: BYPASS GRAFT FEMORAL-POPLITEAL ARTERY;  Surgeon: Serafina Mitchell, MD;  Location: [facility];  Service: Vascular;  Laterality: Right;   FEMORAL-POPLITEAL BYPASS GRAFT Right 09/08/2017   Procedure: REDO BYPASS GRAFT  FEMORAL-POPLITEAL ARTERY;  Surgeon: Serafina Mitchell, MD;  Location: [facility];  Service: Vascular;  Laterality: Right;   FEMORAL-POPLITEAL BYPASS GRAFT Right 07/06/2018   Procedure: REVISION RIGHT FEMORAL TO POPLITEAL ARTERY BYPASS GRAFT;  Surgeon: Serafina Mitchell, MD;  Location: [place];  Service: Vascular;  Laterality: Right;   IR CHOLANGIOGRAM EXISTING TUBE  08/04/2018   IR EXCHANGE BILIARY DRAIN  01/22/2019   IR PERC CHOLECYSTOSTOMY  07/19/2018   IR RADIOLOGIST EVAL & MGMT  08/31/2018   IR RADIOLOGIST EVAL & MGMT  01/17/2019   IR RADIOLOGIST EVAL & MGMT  02/01/2019   IR RADIOLOGIST EVAL & MGMT  02/08/2019   KNEE ARTHROSCOPY     left   LOWER EXTREMITY ANGIOGRAM N/A 05/14/2014   Procedure: LOWER EXTREMITY ANGIOGRAM;  Surgeon: Lorretta Harp, MD;  Location: [facility] CATH LAB;  Service: Cardiovascular;  Laterality: N/A;   LOWER EXTREMITY ANGIOGRAPHY N/A 09/21/2016   Procedure: Lower Extremity Angiography;  Surgeon: Conrad Denison, MD;  Location: [place] [facility];  Service: Cardiovascular;  Laterality: N/A;   PERIPHERAL VASCULAR ATHERECTOMY Right 01/27/2017   Procedure: PERIPHERAL VASCULAR ATHERECTOMY;  Surgeon: Serafina Mitchell, MD;  Location: [place] [facility];  Service: Cardiovascular;  Laterality: Right;   PERIPHERAL VASCULAR BALLOON ANGIOPLASTY Right 06/08/2017   Procedure: PERIPHERAL VASCULAR BALLOON ANGIOPLASTY;  Surgeon: Serafina Mitchell, MD;  Location: [place] [facility];  Service: Cardiovascular;  Laterality: Right;  common femoral and superficial femoral arteries   PERIPHERAL VASCULAR CATHETERIZATION N/A 04/20/2016   Procedure: Lower Extremity Intervention;  Surgeon: Lorretta Harp, MD;  Location: [place] [facility];  Service: Cardiovascular;  Laterality: N/A;   PERIPHERAL VASCULAR INTERVENTION  08/31/2017   Procedure: PERIPHERAL VASCULAR INTERVENTION;  Surgeon: Serafina Mitchell, MD;  Location: [place] [facility];  Service: Cardiovascular;;  REIA   REVERSE SHOULDER ARTHROPLASTY Right 12/07/2016    REVERSE SHOULDER ARTHROPLASTY Right 12/07/2016   Procedure: REVERSE SHOULDER ARTHROPLASTY;  Surgeon: Netta Cedars, MD;  Location: [place];  Service: Orthopedics;  Laterality: Right;   ROTATOR CUFF REPAIR Right 2003   SFA Right 05/14/2014   PTA  OF RT SFA         DR BERRY   Patient Active Problem List   Diagnosis Date Noted   Exudative age-related macular degeneration of left eye with inactive choroidal neovascularization ([place]) 03/25/2020   Exudative age-related macular degeneration of right eye with inactive choroidal neovascularization ([place]) 03/25/2020   Advanced nonexudative age-related macular degeneration of right eye with subfoveal involvement 03/25/2020   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 involvement 03/25/2020   Bacteremia due to Gram-negative bacteria 01/19/2019   CKD (chronic kidney disease) stage 3, GFR 30-59 ml/min ([facility]) 01/19/2019   Stenosis of infrarenal abdominal aorta due to atherosclerosis ([place]) 07/06/2018   S/P shoulder replacement, right 12/07/2016   PVD (  peripheral vascular disease) ([place]) 09/18/2016   Sinus tachycardia 05/15/2014   Carotid artery disease ([place]) 04/26/2014   PAD (peripheral artery disease) ([place]) 06/12/2013   RBBB (right bundle branch block with left anterior fascicular block) 06/12/2013   Claudication ([place]) 12/14/2012   Essential hypertension 12/14/2012   Hyperlipidemia 12/14/2012   Type 2 diabetes mellitus ([place]) 12/14/2012   Personal history of colonic polyps 01/25/2012   DM 08/11/2010   DUODENAL ULCER, ACUTE, HEMORRHAGE 08/11/2010    ONSET DATE: "quite some time"  REFERRING DIAG: R26.81 (ICD-10-CM) - Unsteadiness on feet  THERAPY DIAG:  Unsteadiness on feet  Difficulty in walking, not elsewhere classified  Other abnormalities of gait and mobility  Rationale for Evaluation and Treatment: Rehabilitation  SUBJECTIVE:                                                                                                                                                                                              SUBJECTIVE STATEMENT:  Nothing new, nothing going on same old same old. I'm not active at all in the house, what is there to do in the house. No pain until I start walking, have pain after 40-50 paces. I am a couch potato, I don't do much in the way of activity, former heavy smoker/heavy drinker/I haven't taken care of myself over the years. Not sure what I want to work on today.  Pt accompanied by: self  PERTINENT HISTORY: hypertension, hyperlipidemia and noninsulin-requiring diabetes, legally blind  PAIN:  Are you having pain? No  PRECAUTIONS: Fall  WEIGHT BEARING RESTRICTIONS: No  FALLS: Has patient fallen in last 6 months? Yes. Number of falls 3  LIVING ENVIRONMENT: Lives with: lives with their family and lives with their spouse Lives in: House/apartment Stairs: Yes: Internal: yes but not used steps; bilateral but cannot reach both and External: 3 steps; bilateral but cannot reach both Has following equipment at home: [place] - 4 wheeled  PLOF: Independent  PATIENT GOALS: improve balance, stamina  OBJECTIVE:   TODAY'S TREATMENT 08/31/22  TherEx  Nustep L4x6 minute BLEs only Superset: STS 2x10 + sidesteps along edge of mat table 4 laps (2 rounds) Superset: walking in gym with 3# each LE + alternating step taps x8-10 (no UEs) 2 rounds  Gait with hurry-cane in clinic distances S- much more steady     TherAct  Bed mobility- practiced rolling and side<-> sit transfers     TODAY'S TREATMENT: 08/28/22 Activity Comments  NU-step x 6 min  LE only due to Right shoulder limitations, no rest periods needed today.   LAQ 3x15 3#  Alt stair taps x 2  min 3#, 6" box for SLS  Sidestepping x 2 min 3#  Sit-stand 3x10 8# goblet hold, 24" seat height, blocking feet for knee flexion  Balance on firm/foam -EO/EC x 30 sec -head turns 5x EO/EC  Gastroc stretch x 2 min On slantboard   Retrowalking 4x45 ft   Seated hamstring stretch 2x10 deep breaths  Assisted straight leg h/s stretch 1x60 sec  DKTC assisted stretch X 2 min for hip flexion  Supine butterfly  X2 min for hip add stretch           HOME EXERCISE PROGRAM Last updated: 08/21/22 Access Code: [email or web address] URL: [email or web address] Date: 08/21/2022 Prepared by: [facility]  Exercises - Corner Balance Feet Together With Eyes Open  - 1 x daily - 7 x weekly - 3 sets - 30 sec hold - Corner Balance Feet Together With Eyes Closed  - 1 x daily - 7 x weekly - 3 sets - 30 sec hold - Semi-Tandem Corner Balance With Eyes Open  - 1 x daily - 7 x weekly - 3 sets - 15 sec hold - Seated Long Arc Quad with Ankle Weight  - 1 x daily - 7 x weekly - 3 sets - 15-18 reps - 2 sec hold - Standing Hip Flexion  - 1 x daily - 7 x weekly - 3 sets - 15-18 reps - Side Stepping with Counter Support  - 1 x daily - 7 x weekly - 1-3 sets - 2 min rounds hold - Wall Squat  - 1 x daily - 5 x weekly - 2 sets - 10 reps   PATIENT EDUCATION: Education details: HEP update Person educated: Patient Education method: Explanation, Demonstration, Tactile cues, Verbal cues, and Handouts Education comprehension: verbalized understanding and returned demonstration   Below measures were taken at time of initial evaluation unless otherwise specified:  DIAGNOSTIC FINDINGS:   COGNITION: Overall cognitive status: Within functional limits for tasks assessed   SENSATION: Not tested  COORDINATION: WFL  EDEMA:  None present  MUSCLE TONE: NT  MUSCLE LENGTH: NT  DTRs:  NT  POSTURE: No Significant postural limitations    BED MOBILITY:  DNT but pt reports incr difficulty with bed mobility  TRANSFERS: Assistive device utilized: None  Sit to stand: Complete Independence and Modified independence Stand to sit: Complete Independence and Modified independence Chair to chair: Complete  Independence and Modified independence Floor: Modified independence and SBA, requires UE support on furniture. Unable to bend over to pick up item from ground. Kicks object over towards object that he can use of UE support and assumes half-kneeling    CURB:  Level of Assistance: Modified independence and SBA Assistive device utilized: None Curb Comments: must stop and "size up" the curb  STAIRS: Level of Assistance: Modified independence Stair Negotiation Technique: Alternating Pattern  with Bilateral Rails Number of Stairs: 12  Height of Stairs: 4-6"  Comments:   GAIT: Gait pattern:  decr pelvic rotation and decreased trunk rotation, ankles maintained in DF throughout initial contact and loading response Distance walked: 315 (2MWT) Assistive device utilized: None Level of assistance: Complete Independence and SBA Comments: unsteady during turns  FUNCTIONAL TESTS:  5 times sit to stand: 13.44 Timed up and go (TUG): 14.75 2 minute walk test: vitals at start 98%, 94 bpm 315 ft and rates 13/20 Borg exertion (somewhat hard) 97% and 110 bpm Berg Balance Scale: 37/56 2.6 ft/sec speed based on 2MWT performance  M-CTSIB  Condition 1:  Firm Surface, EO 30 Sec, Normal and Mild Sway  Condition 2: Firm Surface, EC 30 Sec, Normal and Mild Sway  Condition 3: Foam Surface, EO 30 Sec, Moderate and Severe Sway  Condition 4: Foam Surface, EC 4 Sec, Moderate and Severe Sway          GOALS: Goals reviewed with patient? Yes  SHORT TERM GOALS: Target date: 09/02/2022    Independent with HEP to improve functional outcomes Baseline:initiated Goal status: IN PROGRESS  2.  Teach-back principles of exercise for improved muscular endurance in order to combat effects of vascular claudication Baseline:  Goal status: IN PROGRESS  3.  Demonstrate improved safety with gait/mobility per time of 10 sec TUG test Baseline: 13 sec Goal status: IN PROGRESS    LONG TERM GOALS: Target date:  09/23/2022    Demonstrate low risk for falls per score 50/56 Berg Balance Test Baseline: 37/56 Goal status: IN PROGRESS  2.  Manifest improved gait tolerance/endurance per distance of 470 ft during 2MWT with RPE not exceeding 11/20 Baseline: 315 ft w/ RPE 13/20 Borg Goal status: IN PROGRESS  3.  Independent with advanced HEP to address patient's range of deficits/limitations Baseline:  Goal status: IN PROGRESS  4. Improve body mechanics/flexibility to enable bending/stooping/squatting to pick up items from ground  Baseline: Unable  Goal status: IN PROGRESS  ASSESSMENT:  CLINICAL IMPRESSION:  Trimaine arrives today doing OK, seems to have some serious balance issues- stood up in waiting room and crossed legs, got unsteady and would have had a major fall without PT intervention. Continued POC with focus on activity tolerance, progression of functional exercise and also worked on some balance today. Really encouraged HEP compliance and increased exercise at home. Will continue efforts.    OBJECTIVE IMPAIRMENTS: Abnormal gait, cardiopulmonary status limiting activity, decreased activity tolerance, decreased endurance, decreased knowledge of use of DME, decreased mobility, difficulty walking, decreased strength, impaired flexibility, and improper body mechanics.   ACTIVITY LIMITATIONS: carrying, lifting, bending, squatting, transfers, bed mobility, reach over head, and locomotion level  PARTICIPATION LIMITATIONS: meal prep, cleaning, laundry, interpersonal relationship, community activity, and yard work  PERSONAL FACTORS: Age, Fitness, Past/current experiences, Time since onset of injury/illness/exacerbation, and interaction of conditions  are also affecting patient's functional outcome.   REHAB POTENTIAL: Good  CLINICAL DECISION MAKING: Evolving/moderate complexity  EVALUATION COMPLEXITY: Moderate  PLAN:  PT FREQUENCY: 2x/week  PT DURATION: 6 weeks  PLANNED INTERVENTIONS:  Therapeutic exercises, Therapeutic activity, Neuromuscular re-education, Balance training, Gait training, Patient/Family education, Self Care, Joint mobilization, Stair training, Vestibular training, Canalith repositioning, DME instructions, Aquatic Therapy, Dry Needling, Electrical stimulation, Spinal mobilization, Cryotherapy, Moist heat, Taping, Traction, Ionotophoresis '4mg'$ /ml Dexamethasone, and Manual therapy  PLAN FOR NEXT SESSION: review corner balance, review/reassess bed mobility, progressive resistance exercises for muscular endurance  Deniece Ree PT DPT PN2      [facility] [address], [place], [place] [address] Phone # [phone] Fax # [phone]

## 2022-09-03 DIAGNOSIS — E1122 Type 2 diabetes mellitus with diabetic chronic kidney disease: Secondary | ICD-10-CM | POA: Diagnosis not present

## 2022-09-03 DIAGNOSIS — I129 Hypertensive chronic kidney disease with stage 1 through stage 4 chronic kidney disease, or unspecified chronic kidney disease: Secondary | ICD-10-CM | POA: Diagnosis not present

## 2022-09-03 DIAGNOSIS — N1832 Chronic kidney disease, stage 3b: Secondary | ICD-10-CM | POA: Diagnosis not present

## 2022-09-03 DIAGNOSIS — Q6102 Congenital multiple renal cysts: Secondary | ICD-10-CM | POA: Diagnosis not present

## 2022-09-03 DIAGNOSIS — N189 Chronic kidney disease, unspecified: Secondary | ICD-10-CM | POA: Diagnosis not present

## 2022-09-03 DIAGNOSIS — I739 Peripheral vascular disease, unspecified: Secondary | ICD-10-CM | POA: Diagnosis not present

## 2022-09-03 DIAGNOSIS — D631 Anemia in chronic kidney disease: Secondary | ICD-10-CM | POA: Diagnosis not present

## 2022-09-04 ENCOUNTER — Ambulatory Visit: Payer: Medicare Other

## 2022-09-04 DIAGNOSIS — R2681 Unsteadiness on feet: Secondary | ICD-10-CM

## 2022-09-04 DIAGNOSIS — R262 Difficulty in walking, not elsewhere classified: Secondary | ICD-10-CM

## 2022-09-04 DIAGNOSIS — R2689 Other abnormalities of gait and mobility: Secondary | ICD-10-CM | POA: Diagnosis not present

## 2022-09-04 NOTE — Therapy (Signed)
OUTPATIENT PHYSICAL THERAPY NEURO TREATMENT   Patient Name: Christian Bailey MRN: [id number] DOB:[birth year], 82 y.o., male Today's Date: 09/04/2022   PCP: Prince Solian, MD REFERRING PROVIDER: Prince Solian, MD  END OF SESSION:  PT End of Session - 09/04/22 1059     Visit Number 7    Number of Visits 12    Date for PT Re-Evaluation 09/23/22    Authorization Type Medicare A&B / [employer] for life    Progress Note Due on Visit 10    PT Start Time 1100    PT Stop Time 1145    PT Time Calculation (min) 45 min    Equipment Utilized During Treatment Gait belt    Activity Tolerance Patient tolerated treatment well    Behavior During Therapy WFL for tasks assessed/performed               Past Medical History:  Diagnosis Date   Anemia    Complication of anesthesia    Diabetes mellitus ([place])    TYPE 2   GERD (gastroesophageal reflux disease)    History of blood transfusion    GI bleed   History of colon polyps    History of hiatal hernia    Hyperlipemia    Hypertension    Osteoarthritis    PAD (peripheral artery disease) ([place])    a. stenting of his left common iliac artery >20 years ago. b. h/o LEIA stent and 2 stents to R SFA in 2011. c. 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 Noted during admission 04/2014 but upon review seems to be frequent finding for patient.   Stenosis of carotid artery    a. 50% right carotid stenosis by angiogram 04/2014.   Past Surgical History:  Procedure Laterality Date   ABDOMINAL AORTOGRAM W/LOWER EXTREMITY N/A 01/27/2017   Procedure: Abdominal Aortogram w/Lower Extremity;  Surgeon: Serafina Mitchell, MD;  Location: [place] [facility];  Service: Cardiovascular;  Laterality: N/A;   ABDOMINAL AORTOGRAM W/LOWER EXTREMITY N/A 06/08/2017   Procedure: ABDOMINAL AORTOGRAM W/LOWER EXTREMITY;  Surgeon: Serafina Mitchell, MD;  Location: [place] [facility];  Service: Cardiovascular;  Laterality: N/A;   ABDOMINAL AORTOGRAM W/LOWER EXTREMITY N/A 08/31/2017   Procedure: ABDOMINAL AORTOGRAM W/LOWER EXTREMITY;  Surgeon: Serafina Mitchell, MD;  Location: [place] [facility];  Service: Cardiovascular;  Laterality: N/A;  rt. unilateral   ANGIOPLASTY / STENTING FEMORAL     ANGIOPLASTY / STENTING ILIAC     AORTA - BILATERAL FEMORAL ARTERY BYPASS GRAFT N/A 07/06/2018   Procedure: AORTA BIFEMORAL BYPASS GRAFT USING 14X7MM X 40CM HEMASHIELD GOLD GRAFT;  Surgeon: Serafina Mitchell, MD;  Location: [place];  Service: Vascular;  Laterality: N/A;   CEREBRAL ANGIOGRAM N/A 05/14/2014   Procedure: CEREBRAL ANGIOGRAM;  Surgeon: Lorretta Harp, MD;  Location: [facility];  Service: Cardiovascular;  Laterality: N/A;   COLONOSCOPY W/ POLYPECTOMY     ENDARTERECTOMY Right 09/08/2017   ENDARTERECTOMY FEMORAL Right 09/08/2017   Procedure: REDO RIGHT FEMORAL ENDARTECTOMY WITH PATCH ANGIOPLASTY.;  Surgeon: Serafina Mitchell, MD;  Location: [facility];  Service: Vascular;  Laterality: Right;   EYE  SURGERY Bilateral    cataract   FEMORAL ARTERY STENT Right 05/12/2010   Stented distally with a 6x100 Abbott absolute stent and proximally with a 6x60 Cook Zilver stent resulting in the reduction of the proximal segment 80% and mid segment 60-70% to 0% residual, LEFT common femoral artery stented with a 7x3 Smart stent resulting in reduction of 90% stenosis to 0% residual   FEMORAL-POPLITEAL BYPASS GRAFT Right 09/18/2016   Procedure: BYPASS GRAFT FEMORAL-POPLITEAL ARTERY;  Surgeon: Serafina Mitchell, MD;  Location: [facility];  Service: Vascular;  Laterality: Right;   FEMORAL-POPLITEAL BYPASS GRAFT Right 09/08/2017   Procedure: REDO BYPASS GRAFT  FEMORAL-POPLITEAL ARTERY;  Surgeon: Serafina Mitchell, MD;  Location: [facility];  Service: Vascular;  Laterality: Right;   FEMORAL-POPLITEAL BYPASS GRAFT Right 07/06/2018   Procedure: REVISION RIGHT FEMORAL TO POPLITEAL ARTERY BYPASS GRAFT;  Surgeon: Serafina Mitchell, MD;  Location: [place];  Service: Vascular;  Laterality: Right;   IR CHOLANGIOGRAM EXISTING TUBE  08/04/2018   IR EXCHANGE BILIARY DRAIN  01/22/2019   IR PERC CHOLECYSTOSTOMY  07/19/2018   IR RADIOLOGIST EVAL & MGMT  08/31/2018   IR RADIOLOGIST EVAL & MGMT  01/17/2019   IR RADIOLOGIST EVAL & MGMT  02/01/2019   IR RADIOLOGIST EVAL & MGMT  02/08/2019   KNEE ARTHROSCOPY     left   LOWER EXTREMITY ANGIOGRAM N/A 05/14/2014   Procedure: LOWER EXTREMITY ANGIOGRAM;  Surgeon: Lorretta Harp, MD;  Location: [facility] CATH LAB;  Service: Cardiovascular;  Laterality: N/A;   LOWER EXTREMITY ANGIOGRAPHY N/A 09/21/2016   Procedure: Lower Extremity Angiography;  Surgeon: Conrad Denison, MD;  Location: [place] [facility];  Service: Cardiovascular;  Laterality: N/A;   PERIPHERAL VASCULAR ATHERECTOMY Right 01/27/2017   Procedure: PERIPHERAL VASCULAR ATHERECTOMY;  Surgeon: Serafina Mitchell, MD;  Location: [place] [facility];  Service: Cardiovascular;  Laterality: Right;   PERIPHERAL VASCULAR BALLOON ANGIOPLASTY Right 06/08/2017   Procedure: PERIPHERAL VASCULAR BALLOON ANGIOPLASTY;  Surgeon: Serafina Mitchell, MD;  Location: [place] [facility];  Service: Cardiovascular;  Laterality: Right;  common femoral and superficial femoral arteries   PERIPHERAL VASCULAR CATHETERIZATION N/A 04/20/2016   Procedure: Lower Extremity Intervention;  Surgeon: Lorretta Harp, MD;  Location: [place] [facility];  Service: Cardiovascular;  Laterality: N/A;   PERIPHERAL VASCULAR INTERVENTION  08/31/2017   Procedure: PERIPHERAL VASCULAR INTERVENTION;  Surgeon: Serafina Mitchell, MD;  Location: [place] [facility];  Service: Cardiovascular;;  REIA   REVERSE SHOULDER ARTHROPLASTY Right 12/07/2016    REVERSE SHOULDER ARTHROPLASTY Right 12/07/2016   Procedure: REVERSE SHOULDER ARTHROPLASTY;  Surgeon: Netta Cedars, MD;  Location: [place];  Service: Orthopedics;  Laterality: Right;   ROTATOR CUFF REPAIR Right 2003   SFA Right 05/14/2014   PTA  OF RT SFA         DR BERRY   Patient Active Problem List   Diagnosis Date Noted   Exudative age-related macular degeneration of left eye with inactive choroidal neovascularization ([place]) 03/25/2020   Exudative age-related macular degeneration of right eye with inactive choroidal neovascularization ([place]) 03/25/2020   Advanced nonexudative age-related macular degeneration of right eye with subfoveal involvement 03/25/2020   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 involvement 03/25/2020   Bacteremia due to Gram-negative bacteria 01/19/2019   CKD (chronic kidney disease) stage 3, GFR 30-59 ml/min ([facility]) 01/19/2019   Stenosis of infrarenal abdominal aorta due to atherosclerosis ([place]) 07/06/2018   S/P shoulder replacement, right 12/07/2016   PVD (  peripheral vascular disease) ([place]) 09/18/2016   Sinus tachycardia 05/15/2014   Carotid artery disease ([place]) 04/26/2014   PAD (peripheral artery disease) ([place]) 06/12/2013   RBBB (right bundle branch block with left anterior fascicular block) 06/12/2013   Claudication ([place]) 12/14/2012   Essential hypertension 12/14/2012   Hyperlipidemia 12/14/2012   Type 2 diabetes mellitus ([place]) 12/14/2012   Personal history of colonic polyps 01/25/2012   DM 08/11/2010   DUODENAL ULCER, ACUTE, HEMORRHAGE 08/11/2010    ONSET DATE: "quite some time"  REFERRING DIAG: R26.81 (ICD-10-CM) - Unsteadiness on feet  THERAPY DIAG:  Unsteadiness on feet  Difficulty in walking, not elsewhere classified  Other abnormalities of gait and mobility  Rationale for Evaluation and Treatment: Rehabilitation  SUBJECTIVE:                                                                                                                                                                                              SUBJECTIVE STATEMENT:  Nothing new, nothing going on same old same old. I'm not active at all in the house, what is there to do in the house. No pain until I start walking, have pain after 40-50 paces. I am a couch potato, I don't do much in the way of activity, former heavy smoker/heavy drinker/I haven't taken care of myself over the years. Not sure what I want to work on today.  Pt accompanied by: self  PERTINENT HISTORY: hypertension, hyperlipidemia and noninsulin-requiring diabetes, legally blind  PAIN:  Are you having pain? No  PRECAUTIONS: Fall  WEIGHT BEARING RESTRICTIONS: No  FALLS: Has patient fallen in last 6 months? Yes. Number of falls 3  LIVING ENVIRONMENT: Lives with: lives with their family and lives with their spouse Lives in: House/apartment Stairs: Yes: Internal: yes but not used steps; bilateral but cannot reach both and External: 3 steps; bilateral but cannot reach both Has following equipment at home: Walker - 4 wheeled  PLOF: Independent  PATIENT GOALS: improve balance, stamina  OBJECTIVE:   HR 60-80% of max: 83-111 bpm  TODAY'S TREATMENT: 09/04/22 Activity Comments  NU-step LE only x 8 min To improve CV fitness and improve muscular endurance with BLE. No rest periods needed  LAQ 3x18 reps 5#, incr difficulty by 3rd set making to 12 reps vs 18  Sit-stand 3x10 10# goblet, 15 sec rest btwn sets for incr endurance  Alt stair taps x 2.5 min 5#, cues for pacing. HR 103 bpm  Sidestepping x 2.5 min 5#  Sumo deadlift 3x10 15# KB on 4" box, 30 sec rest btwn sets  Gastroc stretch 3x60 sec on slantboard  TODAY'S TREATMENT: 08/28/22 Activity Comments  NU-step x 6 min  LE only due to Right shoulder limitations, no rest periods needed today.   LAQ 3x15 3#  Alt stair taps x 2 min 3#, 6" box for SLS  Sidestepping x 2 min 3#  Sit-stand 3x10 8# goblet  hold, 24" seat height, blocking feet for knee flexion  Balance on firm/foam -EO/EC x 30 sec -head turns 5x EO/EC  Gastroc stretch x 2 min On slantboard  Retrowalking 4x45 ft   Seated hamstring stretch 2x10 deep breaths  Assisted straight leg h/s stretch 1x60 sec  DKTC assisted stretch X 2 min for hip flexion  Supine butterfly  X2 min for hip add stretch           HOME EXERCISE PROGRAM Last updated: 08/21/22 Access Code: [email or web address] URL: [email or web address] Date: 08/21/2022 Prepared by: [facility]  Exercises - Corner Balance Feet Together With Eyes Open  - 1 x daily - 7 x weekly - 3 sets - 30 sec hold - Corner Balance Feet Together With Eyes Closed  - 1 x daily - 7 x weekly - 3 sets - 30 sec hold - Semi-Tandem Corner Balance With Eyes Open  - 1 x daily - 7 x weekly - 3 sets - 15 sec hold - Seated Long Arc Quad with Ankle Weight  - 1 x daily - 7 x weekly - 3 sets - 15-18 reps - 2 sec hold - Standing Hip Flexion  - 1 x daily - 7 x weekly - 3 sets - 15-18 reps - Side Stepping with Counter Support  - 1 x daily - 7 x weekly - 1-3 sets - 2 min rounds hold - Wall Squat  - 1 x daily - 5 x weekly - 2 sets - 10 reps   PATIENT EDUCATION: Education details: HEP update Person educated: Patient Education method: Explanation, Demonstration, Tactile cues, Verbal cues, and Handouts Education comprehension: verbalized understanding and returned demonstration   Below measures were taken at time of initial evaluation unless otherwise specified:    BED MOBILITY:  DNT but pt reports incr difficulty with bed mobility  TRANSFERS: Assistive device utilized: None  Sit to stand: Complete Independence and Modified independence Stand to sit: Complete Independence and Modified independence Chair to chair: Complete Independence and Modified independence Floor: Modified independence and SBA, requires UE support on furniture. Unable to bend over to  pick up item from ground. Kicks object over towards object that he can use of UE support and assumes half-kneeling    CURB:  Level of Assistance: Modified independence and SBA Assistive device utilized: None Curb Comments: must stop and "size up" the curb  STAIRS: Level of Assistance: Modified independence Stair Negotiation Technique: Alternating Pattern  with Bilateral Rails Number of Stairs: 12  Height of Stairs: 4-6"  Comments:   GAIT: Gait pattern:  decr pelvic rotation and decreased trunk rotation, ankles maintained in DF throughout initial contact and loading response Distance walked: 315 (2MWT) Assistive device utilized: None Level of assistance: Complete Independence and SBA Comments: unsteady during turns  FUNCTIONAL TESTS:  5 times sit to stand: 13.44 Timed up and go (TUG): 14.75 2 minute walk test: vitals at start 98%, 94 bpm 315 ft and rates 13/20 Borg exertion (somewhat hard) 97% and 110 bpm Berg Balance Scale: 37/56 2.6 ft/sec speed based on 2MWT performance  M-CTSIB  Condition 1: Firm Surface, EO 30 Sec, Normal and Mild Sway  Condition 2:  Firm Surface, EC 30 Sec, Normal and Mild Sway  Condition 3: Foam Surface, EO 30 Sec, Moderate and Severe Sway  Condition 4: Foam Surface, EC 4 Sec, Moderate and Severe Sway          GOALS: Goals reviewed with patient? Yes  SHORT TERM GOALS: Target date: 09/02/2022    Independent with HEP to improve functional outcomes Baseline:initiated Goal status: IN PROGRESS  2.  Teach-back principles of exercise for improved muscular endurance in order to combat effects of vascular claudication Baseline:  Goal status: IN PROGRESS  3.  Demonstrate improved safety with gait/mobility per time of 10 sec TUG test Baseline: 13 sec Goal status: IN PROGRESS    LONG TERM GOALS: Target date: 09/23/2022    Demonstrate low risk for falls per score 50/56 Berg Balance Test Baseline: 37/56 Goal status: IN PROGRESS  2.  Manifest  improved gait tolerance/endurance per distance of 470 ft during 2MWT with RPE not exceeding 11/20 Baseline: 315 ft w/ RPE 13/20 Borg Goal status: IN PROGRESS  3.  Independent with advanced HEP to address patient's range of deficits/limitations Baseline:  Goal status: IN PROGRESS  4. Improve body mechanics/flexibility to enable bending/stooping/squatting to pick up items from ground  Baseline: Unable  Goal status: IN PROGRESS  ASSESSMENT:  CLINICAL IMPRESSION:  Continued with training focus on improved muscular endurance and efficiency with priority on increased repetitions/weight and decr rest periods between sets/exercises with overall much improved tolerance as evidenced by NU-step x 8 min without rest period and ability to increase reps for large muscle groups.  Discussed training intensity and HR max% thresholds of 60-80%.  Good teachback for principles and overall improved endurance evident by performance   OBJECTIVE IMPAIRMENTS: Abnormal gait, cardiopulmonary status limiting activity, decreased activity tolerance, decreased endurance, decreased knowledge of use of DME, decreased mobility, difficulty walking, decreased strength, impaired flexibility, and improper body mechanics.   ACTIVITY LIMITATIONS: carrying, lifting, bending, squatting, transfers, bed mobility, reach over head, and locomotion level  PARTICIPATION LIMITATIONS: meal prep, cleaning, laundry, interpersonal relationship, community activity, and yard work  PERSONAL FACTORS: Age, Fitness, Past/current experiences, Time since onset of injury/illness/exacerbation, and interaction of conditions  are also affecting patient's functional outcome.   REHAB POTENTIAL: Good  CLINICAL DECISION MAKING: Evolving/moderate complexity  EVALUATION COMPLEXITY: Moderate  PLAN:  PT FREQUENCY: 2x/week  PT DURATION: 6 weeks  PLANNED INTERVENTIONS: Therapeutic exercises, Therapeutic activity, Neuromuscular re-education, Balance  training, Gait training, Patient/Family education, Self Care, Joint mobilization, Stair training, Vestibular training, Canalith repositioning, DME instructions, Aquatic Therapy, Dry Needling, Electrical stimulation, Spinal mobilization, Cryotherapy, Moist heat, Taping, Traction, Ionotophoresis '4mg'$ /ml Dexamethasone, and Manual therapy  PLAN FOR NEXT SESSION: review corner balance, review/reassess bed mobility, progressive resistance exercises for muscular endurance  11:00 AM, 09/04/22 M. Sherlyn Lees, PT, DPT Physical Therapist- [place] Office Number: [phone]

## 2022-09-04 NOTE — Therapy (Signed)
OUTPATIENT PHYSICAL THERAPY NEURO TREATMENT   Patient Name: Christian Bailey MRN: [id number] DOB:[birth year], 82 y.o., male Today's Date: 09/04/2022   PCP: Prince Solian, MD REFERRING PROVIDER: Prince Solian, MD  END OF SESSION:      Past Medical History:  Diagnosis Date   Anemia    Complication of anesthesia    Diabetes mellitus ([place])    TYPE 2   GERD (gastroesophageal reflux disease)    History of blood transfusion    GI bleed   History of colon polyps    History of hiatal hernia    Hyperlipemia    Hypertension    Osteoarthritis    PAD (peripheral artery disease) ([place])    a. stenting of his left common iliac artery >20 years ago. b. h/o LEIA stent and 2 stents to R SFA in 2011. c. 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 Noted during admission 04/2014 but upon review seems to be frequent finding for patient.   Stenosis of carotid artery    a. 50% right carotid stenosis by angiogram 04/2014.   Past Surgical History:  Procedure Laterality Date   ABDOMINAL AORTOGRAM W/LOWER EXTREMITY N/A 01/27/2017   Procedure: Abdominal Aortogram w/Lower Extremity;  Surgeon: Serafina Mitchell, MD;  Location: [place] [facility];  Service: Cardiovascular;  Laterality: N/A;   ABDOMINAL AORTOGRAM W/LOWER EXTREMITY N/A 06/08/2017   Procedure: ABDOMINAL AORTOGRAM W/LOWER EXTREMITY;  Surgeon: Serafina Mitchell, MD;  Location: [place] [facility];  Service: Cardiovascular;  Laterality: N/A;   ABDOMINAL AORTOGRAM W/LOWER EXTREMITY N/A 08/31/2017   Procedure: ABDOMINAL AORTOGRAM W/LOWER EXTREMITY;  Surgeon: Serafina Mitchell, MD;  Location: [place] [facility];  Service: Cardiovascular;  Laterality: N/A;  rt. unilateral   ANGIOPLASTY / STENTING FEMORAL     ANGIOPLASTY / STENTING ILIAC     AORTA - BILATERAL FEMORAL ARTERY BYPASS GRAFT N/A 07/06/2018   Procedure: AORTA BIFEMORAL BYPASS GRAFT USING 14X7MM X 40CM HEMASHIELD GOLD GRAFT;  Surgeon: Serafina Mitchell, MD;  Location: [place];  Service: Vascular;  Laterality: N/A;   CEREBRAL ANGIOGRAM N/A 05/14/2014   Procedure: CEREBRAL ANGIOGRAM;  Surgeon: Lorretta Harp, MD;  Location: [facility];  Service: Cardiovascular;  Laterality: N/A;   COLONOSCOPY W/ POLYPECTOMY     ENDARTERECTOMY Right 09/08/2017   ENDARTERECTOMY FEMORAL Right 09/08/2017   Procedure: REDO RIGHT FEMORAL ENDARTECTOMY WITH PATCH ANGIOPLASTY.;  Surgeon: Serafina Mitchell, MD;  Location: [facility] OR;  Service: Vascular;  Laterality: Right;   EYE SURGERY Bilateral    cataract   FEMORAL ARTERY STENT Right 05/12/2010   Stented distally with a 6x100 Abbott absolute stent and proximally with a 6x60 Cook Zilver stent resulting in the reduction of the proximal segment 80% and mid segment 60-70% to 0% residual, LEFT common femoral artery stented with a 7x3 Smart stent resulting in reduction of 90% stenosis to 0% residual   FEMORAL-POPLITEAL BYPASS GRAFT Right 09/18/2016   Procedure: BYPASS GRAFT FEMORAL-POPLITEAL ARTERY;  Surgeon: Serafina Mitchell, MD;  Location: [facility] OR;  Service: Vascular;  Laterality: Right;   FEMORAL-POPLITEAL BYPASS GRAFT Right 09/08/2017   Procedure: REDO BYPASS GRAFT FEMORAL-POPLITEAL  ARTERY;  Surgeon: Serafina Mitchell, MD;  Location: [facility] OR;  Service: Vascular;  Laterality: Right;   FEMORAL-POPLITEAL BYPASS GRAFT Right 07/06/2018   Procedure: REVISION RIGHT FEMORAL TO POPLITEAL ARTERY BYPASS GRAFT;  Surgeon: Serafina Mitchell, MD;  Location: [place];  Service: Vascular;  Laterality: Right;   IR CHOLANGIOGRAM EXISTING TUBE  08/04/2018   IR EXCHANGE BILIARY DRAIN  01/22/2019   IR PERC CHOLECYSTOSTOMY  07/19/2018   IR RADIOLOGIST EVAL & MGMT   08/31/2018   IR RADIOLOGIST EVAL & MGMT  01/17/2019   IR RADIOLOGIST EVAL & MGMT  02/01/2019   IR RADIOLOGIST EVAL & MGMT  02/08/2019   KNEE ARTHROSCOPY     left   LOWER EXTREMITY ANGIOGRAM N/A 05/14/2014   Procedure: LOWER EXTREMITY ANGIOGRAM;  Surgeon: Lorretta Harp, MD;  Location: [facility] CATH LAB;  Service: Cardiovascular;  Laterality: N/A;   LOWER EXTREMITY ANGIOGRAPHY N/A 09/21/2016   Procedure: Lower Extremity Angiography;  Surgeon: Conrad Llano Grande, MD;  Location: [place] [facility];  Service: Cardiovascular;  Laterality: N/A;   PERIPHERAL VASCULAR ATHERECTOMY Right 01/27/2017   Procedure: PERIPHERAL VASCULAR ATHERECTOMY;  Surgeon: Serafina Mitchell, MD;  Location: [place] [facility];  Service: Cardiovascular;  Laterality: Right;   PERIPHERAL VASCULAR BALLOON ANGIOPLASTY Right 06/08/2017   Procedure: PERIPHERAL VASCULAR BALLOON ANGIOPLASTY;  Surgeon: Serafina Mitchell, MD;  Location: [place] [facility];  Service: Cardiovascular;  Laterality: Right;  common femoral and superficial femoral arteries   PERIPHERAL VASCULAR CATHETERIZATION N/A 04/20/2016   Procedure: Lower Extremity Intervention;  Surgeon: Lorretta Harp, MD;  Location: [place] [facility];  Service: Cardiovascular;  Laterality: N/A;   PERIPHERAL VASCULAR INTERVENTION  08/31/2017   Procedure: PERIPHERAL VASCULAR INTERVENTION;  Surgeon: Serafina Mitchell, MD;  Location: [facility];  Service: Cardiovascular;;  REIA   REVERSE SHOULDER ARTHROPLASTY Right 12/07/2016   REVERSE SHOULDER ARTHROPLASTY Right 12/07/2016   Procedure: REVERSE SHOULDER ARTHROPLASTY;  Surgeon: Netta Cedars, MD;  Location: [place];  Service: Orthopedics;  Laterality: Right;   ROTATOR CUFF REPAIR Right 2003   SFA Right 05/14/2014   PTA  OF RT SFA         DR BERRY   Patient Active Problem List   Diagnosis Date Noted   Exudative age-related macular degeneration of left eye with inactive choroidal neovascularization ([place]) 03/25/2020   Exudative age-related macular  degeneration of right eye with inactive choroidal neovascularization ([place]) 03/25/2020   Advanced nonexudative age-related macular degeneration of right eye with subfoveal involvement 03/25/2020   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 involvement 03/25/2020   Bacteremia due to Gram-negative bacteria 01/19/2019   CKD (chronic kidney disease) stage 3, GFR 30-59 ml/min ([facility]) 01/19/2019   Stenosis of infrarenal abdominal aorta due to atherosclerosis ([place]) 07/06/2018   S/P shoulder replacement, right 12/07/2016   PVD (peripheral vascular disease) ([place]) 09/18/2016   Sinus tachycardia 05/15/2014   Carotid artery disease ([place]) 04/26/2014   PAD (peripheral artery disease) ([place]) 06/12/2013   RBBB (right bundle branch block with left anterior fascicular block) 06/12/2013   Claudication ([place]) 12/14/2012   Essential hypertension 12/14/2012   Hyperlipidemia 12/14/2012   Type 2 diabetes mellitus ([place]) 12/14/2012   Personal history of colonic polyps 01/25/2012   DM 08/11/2010   DUODENAL ULCER, ACUTE, HEMORRHAGE 08/11/2010    ONSET DATE: "quite some time"  REFERRING DIAG: R26.81 (ICD-10-CM) - Unsteadiness on feet  THERAPY DIAG:  No diagnosis found.  Rationale for Evaluation and Treatment: Rehabilitation  SUBJECTIVE:                                                                                                                                                                                             SUBJECTIVE STATEMENT:  Nothing new, nothing going on same old same old. I'm not active at all in the house, what is there to do in the house. No pain until I start walking, have pain after 40-50 paces. I am a couch potato, I don't do much in the way of activity, former heavy smoker/heavy drinker/I haven't taken care of myself over the years. Not sure what I want to work on today.  Pt accompanied by: self  PERTINENT HISTORY: hypertension, hyperlipidemia and  noninsulin-requiring diabetes, legally blind  PAIN:  Are you having pain? No  PRECAUTIONS: Fall  WEIGHT BEARING RESTRICTIONS: No  FALLS: Has patient fallen in last 6 months? Yes. Number of falls 3  LIVING ENVIRONMENT: Lives with: lives with their family and lives with their spouse Lives in: House/apartment Stairs: Yes: Internal: yes but not used steps; bilateral but cannot reach both and External: 3 steps; bilateral but cannot reach both Has following equipment at home: Walker - 4 wheeled  PLOF: Independent  PATIENT GOALS: improve balance, stamina  OBJECTIVE:    TODAY'S TREATMENT: 09/07/22 Activity Comments                          HOME EXERCISE PROGRAM Last updated: 08/21/22 Access Code: [email or web address] URL: [email or web address] Date: 08/21/2022 Prepared by: [facility]  Exercises - Corner Balance Feet Together With Eyes Open  - 1 x daily - 7 x weekly - 3 sets - 30 sec hold - Corner Balance Feet Together With Eyes Closed  - 1 x daily - 7 x weekly - 3 sets - 30 sec hold - Semi-Tandem Corner Balance With Eyes Open  - 1 x daily - 7 x weekly - 3 sets - 15 sec hold - Seated Long Arc Quad with Ankle Weight  - 1 x daily - 7 x weekly - 3 sets - 15-18 reps - 2 sec hold - Standing Hip Flexion  - 1 x daily - 7 x weekly - 3 sets - 15-18 reps - Side Stepping with Counter Support  - 1 x daily - 7 x weekly - 1-3 sets - 2 min rounds hold - Wall Squat  - 1 x daily - 5 x weekly - 2 sets - 10 reps     Below measures were taken at time of initial evaluation unless otherwise  specified:  DIAGNOSTIC FINDINGS:   COGNITION: Overall cognitive status: Within functional limits for tasks assessed   SENSATION: Not tested  COORDINATION: WFL  EDEMA:  None present  MUSCLE TONE: NT  MUSCLE LENGTH: NT  DTRs:  NT  POSTURE: No Significant postural limitations    BED MOBILITY:  DNT but pt reports incr difficulty with bed  mobility  TRANSFERS: Assistive device utilized: None  Sit to stand: Complete Independence and Modified independence Stand to sit: Complete Independence and Modified independence Chair to chair: Complete Independence and Modified independence Floor: Modified independence and SBA, requires UE support on furniture. Unable to bend over to pick up item from ground. Kicks object over towards object that he can use of UE support and assumes half-kneeling    CURB:  Level of Assistance: Modified independence and SBA Assistive device utilized: None Curb Comments: must stop and "size up" the curb  STAIRS: Level of Assistance: Modified independence Stair Negotiation Technique: Alternating Pattern  with Bilateral Rails Number of Stairs: 12  Height of Stairs: 4-6"  Comments:   GAIT: Gait pattern:  decr pelvic rotation and decreased trunk rotation, ankles maintained in DF throughout initial contact and loading response Distance walked: 315 (2MWT) Assistive device utilized: None Level of assistance: Complete Independence and SBA Comments: unsteady during turns  FUNCTIONAL TESTS:  5 times sit to stand: 13.44 Timed up and go (TUG): 14.75 2 minute walk test: vitals at start 98%, 94 bpm 315 ft and rates 13/20 Borg exertion (somewhat hard) 97% and 110 bpm Berg Balance Scale: 37/56 2.6 ft/sec speed based on 2MWT performance  M-CTSIB  Condition 1: Firm Surface, EO 30 Sec, Normal and Mild Sway  Condition 2: Firm Surface, EC 30 Sec, Normal and Mild Sway  Condition 3: Foam Surface, EO 30 Sec, Moderate and Severe Sway  Condition 4: Foam Surface, EC 4 Sec, Moderate and Severe Sway          GOALS: Goals reviewed with patient? Yes  SHORT TERM GOALS: Target date: 09/02/2022    Independent with HEP to improve functional outcomes Baseline:initiated Goal status: IN PROGRESS  2.  Teach-back principles of exercise for improved muscular endurance in order to combat effects of vascular  claudication Baseline:  Goal status: IN PROGRESS  3.  Demonstrate improved safety with gait/mobility per time of 10 sec TUG test Baseline: 13 sec Goal status: IN PROGRESS    LONG TERM GOALS: Target date: 09/23/2022    Demonstrate low risk for falls per score 50/56 Berg Balance Test Baseline: 37/56 Goal status: IN PROGRESS  2.  Manifest improved gait tolerance/endurance per distance of 470 ft during 2MWT with RPE not exceeding 11/20 Baseline: 315 ft w/ RPE 13/20 Borg Goal status: IN PROGRESS  3.  Independent with advanced HEP to address patient's range of deficits/limitations Baseline:  Goal status: IN PROGRESS  4. Improve body mechanics/flexibility to enable bending/stooping/squatting to pick up items from ground  Baseline: Unable  Goal status: IN PROGRESS  ASSESSMENT:  CLINICAL IMPRESSION:  Ezra arrives today doing OK, seems to have some serious balance issues- stood up in waiting room and crossed legs, got unsteady and would have had a major fall without PT intervention. Continued POC with focus on activity tolerance, progression of functional exercise and also worked on some balance today. Really encouraged HEP compliance and increased exercise at home. Will continue efforts.    OBJECTIVE IMPAIRMENTS: Abnormal gait, cardiopulmonary status limiting activity, decreased activity tolerance, decreased endurance, decreased knowledge of use of DME, decreased mobility,  difficulty walking, decreased strength, impaired flexibility, and improper body mechanics.   ACTIVITY LIMITATIONS: carrying, lifting, bending, squatting, transfers, bed mobility, reach over head, and locomotion level  PARTICIPATION LIMITATIONS: meal prep, cleaning, laundry, interpersonal relationship, community activity, and yard work  PERSONAL FACTORS: Age, Fitness, Past/current experiences, Time since onset of injury/illness/exacerbation, and interaction of conditions  are also affecting patient's functional  outcome.   REHAB POTENTIAL: Good  CLINICAL DECISION MAKING: Evolving/moderate complexity  EVALUATION COMPLEXITY: Moderate  PLAN:  PT FREQUENCY: 2x/week  PT DURATION: 6 weeks  PLANNED INTERVENTIONS: Therapeutic exercises, Therapeutic activity, Neuromuscular re-education, Balance training, Gait training, Patient/Family education, Self Care, Joint mobilization, Stair training, Vestibular training, Canalith repositioning, DME instructions, Aquatic Therapy, Dry Needling, Electrical stimulation, Spinal mobilization, Cryotherapy, Moist heat, Taping, Traction, Ionotophoresis '4mg'$ /ml Dexamethasone, and Manual therapy  PLAN FOR NEXT SESSION: review corner balance, review/reassess bed mobility, progressive resistance exercises for muscular endurance

## 2022-09-07 ENCOUNTER — Ambulatory Visit: Payer: Medicare Other | Admitting: Physical Therapy

## 2022-09-07 ENCOUNTER — Encounter: Payer: Self-pay | Admitting: Physical Therapy

## 2022-09-07 DIAGNOSIS — R2689 Other abnormalities of gait and mobility: Secondary | ICD-10-CM | POA: Diagnosis not present

## 2022-09-07 DIAGNOSIS — R262 Difficulty in walking, not elsewhere classified: Secondary | ICD-10-CM

## 2022-09-07 DIAGNOSIS — R2681 Unsteadiness on feet: Secondary | ICD-10-CM

## 2022-09-10 ENCOUNTER — Other Ambulatory Visit: Payer: Self-pay

## 2022-09-10 MED ORDER — PANTOPRAZOLE SODIUM 40 MG PO TBEC: 40.0000 mg | DELAYED_RELEASE_TABLET | Freq: Two times a day (BID) | ORAL | 3 refills | Status: DC

## 2022-09-10 NOTE — Therapy (Signed)
OUTPATIENT PHYSICAL THERAPY NEURO PROGRESS NOTE   Patient Name: Christian Bailey MRN: [id number] DOB:[birth year], 82 y.o., male Today's Date: 09/14/2022   PCP: Prince Solian, MD REFERRING PROVIDER: Prince Solian, MD   Progress Note Reporting Period 08/12/22 to 09/14/22  See note below for Objective Data and Assessment of Progress/Goals.     END OF SESSION:  PT End of Session - 09/14/22 0927     Visit Number 10    Number of Visits 12    Date for PT Re-Evaluation 09/23/22    Authorization Type Medicare A&B / [employer] for life    Progress Note Due on Visit 10    PT Start Time 0845    PT Stop Time 0926    PT Time Calculation (min) 41 min    Equipment Utilized During Treatment Gait belt    Activity Tolerance Patient tolerated treatment well    Behavior During Therapy WFL for tasks assessed/performed                 Past Medical History:  Diagnosis Date   Anemia    Complication of anesthesia    Diabetes mellitus ([place])    TYPE 2   GERD (gastroesophageal reflux disease)    History of blood transfusion    GI bleed   History of colon polyps    History of hiatal hernia    Hyperlipemia    Hypertension    Osteoarthritis    PAD (peripheral artery disease) ([place])    a. stenting of his left common iliac artery >20 years ago. b. h/o LEIA stent and 2 stents to R SFA in 2011. c. 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 Noted during admission 04/2014 but upon review seems to be frequent finding for patient.   Stenosis of carotid artery    a. 50% right carotid stenosis by angiogram 04/2014.   Past Surgical History:  Procedure  Laterality Date   ABDOMINAL AORTOGRAM W/LOWER EXTREMITY N/A 01/27/2017   Procedure: Abdominal Aortogram w/Lower Extremity;  Surgeon: Serafina Mitchell, MD;  Location: [place] [facility];  Service: Cardiovascular;  Laterality: N/A;   ABDOMINAL AORTOGRAM W/LOWER EXTREMITY N/A 06/08/2017   Procedure: ABDOMINAL AORTOGRAM W/LOWER EXTREMITY;  Surgeon: Serafina Mitchell, MD;  Location: [place] [facility];  Service: Cardiovascular;  Laterality: N/A;   ABDOMINAL AORTOGRAM W/LOWER EXTREMITY N/A 08/31/2017   Procedure: ABDOMINAL AORTOGRAM W/LOWER EXTREMITY;  Surgeon: Serafina Mitchell, MD;  Location: [place] [facility];  Service: Cardiovascular;  Laterality: N/A;  rt. unilateral   ANGIOPLASTY / STENTING FEMORAL     ANGIOPLASTY / STENTING ILIAC     AORTA - BILATERAL FEMORAL ARTERY BYPASS GRAFT N/A 07/06/2018   Procedure: AORTA BIFEMORAL BYPASS GRAFT USING 14X7MM X 40CM HEMASHIELD GOLD GRAFT;  Surgeon: Serafina Mitchell, MD;  Location: [place];  Service: Vascular;  Laterality: N/A;   CEREBRAL ANGIOGRAM N/A 05/14/2014   Procedure: CEREBRAL ANGIOGRAM;  Surgeon: Lorretta Harp, MD;  Location[facility];  Service: Cardiovascular;  Laterality: N/A;   COLONOSCOPY W/ POLYPECTOMY     ENDARTERECTOMY Right 09/08/2017   ENDARTERECTOMY FEMORAL Right 09/08/2017   Procedure:  REDO RIGHT FEMORAL ENDARTECTOMY WITH PATCH ANGIOPLASTY.;  Surgeon: Serafina Mitchell, MD;  Location: [facility];  Service: Vascular;  Laterality: Right;   EYE SURGERY Bilateral    cataract   FEMORAL ARTERY STENT Right 05/12/2010   Stented distally with a 6x100 Abbott absolute stent and proximally with a 6x60 Cook Zilver stent resulting in the reduction of the proximal segment 80% and mid segment 60-70% to 0% residual, LEFT common femoral artery stented with a 7x3 Smart stent resulting in reduction of 90% stenosis to 0% residual   FEMORAL-POPLITEAL BYPASS GRAFT Right 09/18/2016   Procedure: BYPASS GRAFT FEMORAL-POPLITEAL ARTERY;  Surgeon: Serafina Mitchell, MD;   Location: [facility];  Service: Vascular;  Laterality: Right;   FEMORAL-POPLITEAL BYPASS GRAFT Right 09/08/2017   Procedure: REDO BYPASS GRAFT FEMORAL-POPLITEAL ARTERY;  Surgeon: Serafina Mitchell, MD;  Location: [facility];  Service: Vascular;  Laterality: Right;   FEMORAL-POPLITEAL BYPASS GRAFT Right 07/06/2018   Procedure: REVISION RIGHT FEMORAL TO POPLITEAL ARTERY BYPASS GRAFT;  Surgeon: Serafina Mitchell, MD;  Location: [place];  Service: Vascular;  Laterality: Right;   IR CHOLANGIOGRAM EXISTING TUBE  08/04/2018   IR EXCHANGE BILIARY DRAIN  01/22/2019   IR PERC CHOLECYSTOSTOMY  07/19/2018   IR RADIOLOGIST EVAL & MGMT  08/31/2018   IR RADIOLOGIST EVAL & MGMT  01/17/2019   IR RADIOLOGIST EVAL & MGMT  02/01/2019   IR RADIOLOGIST EVAL & MGMT  02/08/2019   KNEE ARTHROSCOPY     left   LOWER EXTREMITY ANGIOGRAM N/A 05/14/2014   Procedure: LOWER EXTREMITY ANGIOGRAM;  Surgeon: Lorretta Harp, MD;  Location: [facility] CATH LAB;  Service: Cardiovascular;  Laterality: N/A;   LOWER EXTREMITY ANGIOGRAPHY N/A 09/21/2016   Procedure: Lower Extremity Angiography;  Surgeon: Conrad Livengood, MD;  Location: [place] [facility];  Service: Cardiovascular;  Laterality: N/A;   PERIPHERAL VASCULAR ATHERECTOMY Right 01/27/2017   Procedure: PERIPHERAL VASCULAR ATHERECTOMY;  Surgeon: Serafina Mitchell, MD;  Location: [place] [facility];  Service: Cardiovascular;  Laterality: Right;   PERIPHERAL VASCULAR BALLOON ANGIOPLASTY Right 06/08/2017   Procedure: PERIPHERAL VASCULAR BALLOON ANGIOPLASTY;  Surgeon: Serafina Mitchell, MD;  Location: [place] [facility];  Service: Cardiovascular;  Laterality: Right;  common femoral and superficial femoral arteries   PERIPHERAL VASCULAR CATHETERIZATION N/A 04/20/2016   Procedure: Lower Extremity Intervention;  Surgeon: Lorretta Harp, MD;  Location: [place] [facility];  Service: Cardiovascular;  Laterality: N/A;   PERIPHERAL VASCULAR INTERVENTION  08/31/2017   Procedure: PERIPHERAL VASCULAR INTERVENTION;  Surgeon:  Serafina Mitchell, MD;  Location: [place] [facility];  Service: Cardiovascular;;  REIA   REVERSE SHOULDER ARTHROPLASTY Right 12/07/2016   REVERSE SHOULDER ARTHROPLASTY Right 12/07/2016   Procedure: REVERSE SHOULDER ARTHROPLASTY;  Surgeon: Netta Cedars, MD;  Location: [place];  Service: Orthopedics;  Laterality: Right;   ROTATOR CUFF REPAIR Right 2003   SFA Right 05/14/2014   PTA  OF RT SFA         DR BERRY   Patient Active Problem List   Diagnosis Date Noted   Exudative age-related macular degeneration of left eye with inactive choroidal neovascularization ([place]) 03/25/2020   Exudative age-related macular degeneration of right eye with inactive choroidal neovascularization ([place]) 03/25/2020   Advanced nonexudative age-related macular degeneration of right eye with subfoveal involvement 03/25/2020   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 involvement 03/25/2020   Bacteremia due to Gram-negative bacteria 01/19/2019   CKD (chronic kidney disease) stage 3, GFR  30-59 ml/min ([place]) 01/19/2019   Stenosis of infrarenal abdominal aorta due to atherosclerosis ([place]) 07/06/2018   S/P shoulder replacement, right 12/07/2016   PVD (peripheral vascular disease) ([place]) 09/18/2016   Sinus tachycardia 05/15/2014   Carotid artery disease ([place]) 04/26/2014   PAD (peripheral artery disease) ([place]) 06/12/2013   RBBB (right bundle branch block with left anterior fascicular block) 06/12/2013   Claudication ([place]) 12/14/2012   Essential hypertension 12/14/2012   Hyperlipidemia 12/14/2012   Type 2 diabetes mellitus ([place]) 12/14/2012   Personal history of colonic polyps 01/25/2012   DM 08/11/2010   DUODENAL ULCER, ACUTE, HEMORRHAGE 08/11/2010    ONSET DATE: "quite some time"  REFERRING DIAG: R26.81 (ICD-10-CM) - Unsteadiness on feet  THERAPY DIAG:  Unsteadiness on feet  Difficulty in walking, not elsewhere classified  Other abnormalities of gait and mobility  Rationale  for Evaluation and Treatment: Rehabilitation  SUBJECTIVE:                                                                                                                                                                                             SUBJECTIVE STATEMENT: Did not do anything fun over the weekend. R thigh pain occurs occasionally and comes/goes quickly. Admits to not getting to his HEP frequently d/t lack of motivation but denies questions.  Pt accompanied by: self  PERTINENT HISTORY: hypertension, hyperlipidemia and noninsulin-requiring diabetes, legally blind  PAIN:  Are you having pain? No  PRECAUTIONS: Fall  WEIGHT BEARING RESTRICTIONS: No  FALLS: Has patient fallen in last 6 months? Yes. Number of falls 3  LIVING ENVIRONMENT: Lives with: lives with their family and lives with their spouse Lives in: House/apartment Stairs: Yes: Internal: yes but not used steps; bilateral but cannot reach both and External: 3 steps; bilateral but cannot reach both Has following equipment at home: Walker - 4 wheeled  PLOF: Independent  PATIENT GOALS: improve balance, stamina  OBJECTIVE:    TODAY'S TREATMENT: 09/14/22 Activity Comments  TUG 12.57 sec with arms folded across chest throughout   Berg 39/56  2MWT Pre test:97% spO2, 72 bpm, 134/62 mmHg   Post test: 161/61 mmHg, 96 & spO2, 100bpm Completed 340 ft without AD C/o 4/10 LE pain and 15/20 on Borg scale   Picking up item from the floor  Pt still unable; limited by c/o B thigh pain           [facility] PT Assessment - 09/14/22 0001       Berg Balance Test   Sit to Stand Able to stand without using hands and stabilize independently    Standing Unsupported Able to stand safely 2  minutes    Sitting with Back Unsupported but Feet Supported on Floor or Stool Able to sit safely and securely 2 minutes    Stand to Sit Sits safely with minimal use of hands    Transfers Able to transfer safely, minor use of hands    Standing  Unsupported with Eyes Closed Able to stand 10 seconds with supervision    Standing Unsupported with Feet Together Able to place feet together independently and stand 1 minute safely    From Standing, Reach Forward with Outstretched Arm Can reach forward >12 cm safely (5")    From Standing Position, Pick up Object from Floor Unable to pick up and needs supervision   pain in thighs limiting; able to lift from 8: stool   From Standing Position, Turn to Look Behind Over each Shoulder Turn sideways only but maintains balance    Turn 360 Degrees Able to turn 360 degrees safely but slowly    Standing Unsupported, Alternately Place Feet on Step/Stool Able to complete >2 steps/needs minimal assist   completed 8 taps on 3rd try but with min-mod A to recover LOB on 1st 2 reps   Standing Unsupported, One Foot in Front Able to take small step independently and hold 30 seconds    Standing on One Leg Tries to lift leg/unable to hold 3 seconds but remains standing independently    Total Score 39               PATIENT EDUCATION: Education details: edu on benefits of exercise for circulatory health; edu on exam findings and remaining impairments; edu on continuing to work on Editor, commissioning and walking regimen(shorter, more frequent walks d/t PAD pain) for max benefit  Person educated: Patient Education method: Explanation Education comprehension: verbalized understanding   HOME EXERCISE PROGRAM Last updated: 08/21/22 Access Code: [email or web address] URL: [email or web address] Date: 08/21/2022 Prepared by: [facility]  Exercises - Corner Balance Feet Together With Eyes Open  - 1 x daily - 7 x weekly - 3 sets - 30 sec hold - Corner Balance Feet Together With Eyes Closed  - 1 x daily - 7 x weekly - 3 sets - 30 sec hold - Semi-Tandem Corner Balance With Eyes Open  - 1 x daily - 7 x weekly - 3 sets - 15 sec hold - Seated Long Arc Quad with Ankle Weight  - 1 x  daily - 7 x weekly - 3 sets - 15-18 reps - 2 sec hold - Standing Hip Flexion  - 1 x daily - 7 x weekly - 3 sets - 15-18 reps - Side Stepping with Counter Support  - 1 x daily - 7 x weekly - 1-3 sets - 2 min rounds hold - Wall Squat  - 1 x daily - 5 x weekly - 2 sets - 10 reps     Below measures were taken at time of initial evaluation unless otherwise specified:  DIAGNOSTIC FINDINGS:   COGNITION: Overall cognitive status: Within functional limits for tasks assessed   SENSATION: Not tested  COORDINATION: WFL  EDEMA:  None present  MUSCLE TONE: NT  MUSCLE LENGTH: NT  DTRs:  NT  POSTURE: No Significant postural limitations    BED MOBILITY:  DNT but pt reports incr difficulty with bed mobility  TRANSFERS: Assistive device utilized: None  Sit to stand: Complete Independence and Modified independence Stand to sit: Complete Independence and Modified independence Chair to chair: Complete Independence and Modified independence  Floor: Modified independence and SBA, requires UE support on furniture. Unable to bend over to pick up item from ground. Kicks object over towards object that he can use of UE support and assumes half-kneeling    CURB:  Level of Assistance: Modified independence and SBA Assistive device utilized: None Curb Comments: must stop and "size up" the curb  STAIRS: Level of Assistance: Modified independence Stair Negotiation Technique: Alternating Pattern  with Bilateral Rails Number of Stairs: 12  Height of Stairs: 4-6"  Comments:   GAIT: Gait pattern:  decr pelvic rotation and decreased trunk rotation, ankles maintained in DF throughout initial contact and loading response Distance walked: 315 (2MWT) Assistive device utilized: None Level of assistance: Complete Independence and SBA Comments: unsteady during turns  FUNCTIONAL TESTS:  5 times sit to stand: 13.44 Timed up and go (TUG): 14.75 2 minute walk test: vitals at start 98%, 94 bpm 315  ft and rates 13/20 Borg exertion (somewhat hard) 97% and 110 bpm Berg Balance Scale: 37/56 2.6 ft/sec speed based on 2MWT performance  M-CTSIB  Condition 1: Firm Surface, EO 30 Sec, Normal and Mild Sway  Condition 2: Firm Surface, EC 30 Sec, Normal and Mild Sway  Condition 3: Foam Surface, EO 30 Sec, Moderate and Severe Sway  Condition 4: Foam Surface, EC 4 Sec, Moderate and Severe Sway          GOALS: Goals reviewed with patient? Yes  SHORT TERM GOALS: Target date: 09/02/2022    Independent with HEP to improve functional outcomes Baseline:initiated; denies questions 09/14/22 Goal status: MET 09/14/22  2.  Teach-back principles of exercise for improved muscular endurance in order to combat effects of vascular claudication Baseline:  Goal status: MET 09/14/22  3.  Demonstrate improved safety with gait/mobility per time of 10 sec TUG test Baseline: 13 sec; 12.57 sec 09/14/22 Goal status: IN PROGRESS 09/14/22    LONG TERM GOALS: Target date: 09/23/2022    Demonstrate low risk for falls per score 50/56 Berg Balance Test Baseline: 37/56; 39/56 09/14/22 Goal status: IN PROGRESS 09/14/22  2.  Manifest improved gait tolerance/endurance per distance of 470 ft during 2MWT with RPE not exceeding 11/20 Baseline: 315 ft w/ RPE 13/20 Borg; 340 ft with RPE 15/20 Goal status: IN PROGRESS 09/14/22  3.  Independent with advanced HEP to address patient's range of deficits/limitations Baseline:  Goal status: IN PROGRESS 09/14/22  4. Improve body mechanics/flexibility to enable bending/stooping/squatting to pick up items from ground  Baseline: Unable; unable d/t B thigh pain limiting 09/14/22  Goal status: IN PROGRESS 09/14/22  ASSESSMENT:  CLINICAL IMPRESSION: Patient admits to limited HEP adherence d/t lack of motivation. However able to recall that walking/exercise will promote good circulatory health to address PAD. Patient scored 39/56 on Berg, indicating increased risk of falls but  slightly improved from initial assessment. Most difficulty was evident with toe taps, bending to pick items off the ground, and balance with EC. TUG time and 2MWT distance also improved slightly this session, indicating improvement in gait speed/endurance. Encouraged improved compliance with HEP and more frequent but shorter walks d/t PAD pain. Patient reported understand and without complaints at end of session.     OBJECTIVE IMPAIRMENTS: Abnormal gait, cardiopulmonary status limiting activity, decreased activity tolerance, decreased endurance, decreased knowledge of use of DME, decreased mobility, difficulty walking, decreased strength, impaired flexibility, and improper body mechanics.   ACTIVITY LIMITATIONS: carrying, lifting, bending, squatting, transfers, bed mobility, reach over head, and locomotion level  PARTICIPATION LIMITATIONS: meal prep, cleaning,  laundry, interpersonal relationship, community activity, and yard work  PERSONAL FACTORS: Age, Fitness, Past/current experiences, Time since onset of injury/illness/exacerbation, and interaction of conditions  are also affecting patient's functional outcome.   REHAB POTENTIAL: Good  CLINICAL DECISION MAKING: Evolving/moderate complexity  EVALUATION COMPLEXITY: Moderate  PLAN:  PT FREQUENCY: 2x/week  PT DURATION: 6 weeks  PLANNED INTERVENTIONS: Therapeutic exercises, Therapeutic activity, Neuromuscular re-education, Balance training, Gait training, Patient/Family education, Self Care, Joint mobilization, Stair training, Vestibular training, Canalith repositioning, DME instructions, Aquatic Therapy, Dry Needling, Electrical stimulation, Spinal mobilization, Cryotherapy, Moist heat, Taping, Traction, Ionotophoresis 4mg /ml Dexamethasone, and Manual therapy  PLAN FOR NEXT SESSION: update HEP with more LE strengthening activities; pt reports he will be ready for DC within coming sessions; progressive resistance exercises for muscular  endurance; gait training with cane    Janene Harvey, PT, DPT 09/14/22 9:31 AM  [facility] [address], [place], [place] [address] Phone # [phone] Fax # [phone]

## 2022-09-11 ENCOUNTER — Ambulatory Visit: Payer: Medicare Other | Admitting: Physical Therapy

## 2022-09-11 DIAGNOSIS — R262 Difficulty in walking, not elsewhere classified: Secondary | ICD-10-CM

## 2022-09-11 DIAGNOSIS — R2681 Unsteadiness on feet: Secondary | ICD-10-CM | POA: Diagnosis not present

## 2022-09-11 DIAGNOSIS — R2689 Other abnormalities of gait and mobility: Secondary | ICD-10-CM | POA: Diagnosis not present

## 2022-09-11 NOTE — Therapy (Signed)
OUTPATIENT PHYSICAL THERAPY NEURO TREATMENT   Patient Name: Christian Bailey MRN: [id number] DOB:May 12, 1941, 82 y.o., male Today's Date: 09/11/2022   PCP: Prince Solian, MD REFERRING PROVIDER: Prince Solian, MD  END OF SESSION:  PT End of Session - 09/11/22 1101     Visit Number 9    Number of Visits 12    Date for PT Re-Evaluation 09/23/22    Authorization Type Medicare A&B / T[employer] for life    Progress Note Due on Visit 10    PT Start Time 1104    PT Stop Time 1144    PT Time Calculation (min) 40 min    Equipment Utilized During Treatment --    Activity Tolerance Patient tolerated treatment well    Behavior During Therapy WFL for tasks assessed/performed                Past Medical History:  Diagnosis Date   Anemia    Complication of anesthesia    Diabetes mellitus ([place])    TYPE 2   GERD (gastroesophageal reflux disease)    History of blood transfusion    GI bleed   History of colon polyps    History of hiatal hernia    Hyperlipemia    Hypertension    Osteoarthritis    PAD (peripheral artery disease) ([place])    a. stenting of his left common iliac artery >20 years ago. b. h/o LEIA stent and 2 stents to R SFA in 2011. c. 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 Noted during admission 04/2014 but upon review seems to be frequent finding for patient.   Stenosis of carotid artery    a. 50% right carotid stenosis by angiogram 04/2014.   Past Surgical History:  Procedure Laterality Date   ABDOMINAL AORTOGRAM W/LOWER EXTREMITY N/A 01/27/2017   Procedure: Abdominal Aortogram w/Lower Extremity;  Surgeon: Serafina Mitchell, MD;  Location: [place] [facility];  Service: Cardiovascular;  Laterality: N/A;   ABDOMINAL AORTOGRAM W/LOWER EXTREMITY N/A 06/08/2017   Procedure: ABDOMINAL AORTOGRAM W/LOWER EXTREMITY;  Surgeon: Serafina Mitchell, MD;  Location: [place] [facility];  Service: Cardiovascular;  Laterality: N/A;   ABDOMINAL AORTOGRAM W/LOWER EXTREMITY N/A 08/31/2017   Procedure: ABDOMINAL AORTOGRAM W/LOWER EXTREMITY;  Surgeon: Serafina Mitchell, MD;  Location: [place] [facility];  Service: Cardiovascular;  Laterality: N/A;  rt. unilateral   ANGIOPLASTY / STENTING FEMORAL     ANGIOPLASTY / STENTING ILIAC     AORTA - BILATERAL FEMORAL ARTERY BYPASS GRAFT N/A 07/06/2018   Procedure: AORTA BIFEMORAL BYPASS GRAFT USING 14X7MM X 40CM HEMASHIELD GOLD GRAFT;  Surgeon: Serafina Mitchell, MD;  Location: [place];  Service: Vascular;  Laterality: N/A;   CEREBRAL ANGIOGRAM N/A 05/14/2014   Procedure: CEREBRAL ANGIOGRAM;  Surgeon: Lorretta Harp, MD;  Location: [facility];  Service: Cardiovascular;  Laterality: N/A;   COLONOSCOPY W/ POLYPECTOMY     ENDARTERECTOMY Right 09/08/2017   ENDARTERECTOMY FEMORAL Right 09/08/2017   Procedure: REDO RIGHT FEMORAL ENDARTECTOMY WITH PATCH ANGIOPLASTY.;  Surgeon: Serafina Mitchell, MD;  Location: [facility];  Service: Vascular;  Laterality: Right;   EYE  SURGERY Bilateral    cataract   FEMORAL ARTERY STENT Right 05/12/2010   Stented distally with a 6x100 Abbott absolute stent and proximally with a 6x60 Cook Zilver stent resulting in the reduction of the proximal segment 80% and mid segment 60-70% to 0% residual, LEFT common femoral artery stented with a 7x3 Smart stent resulting in reduction of 90% stenosis to 0% residual   FEMORAL-POPLITEAL BYPASS GRAFT Right 09/18/2016   Procedure: BYPASS GRAFT FEMORAL-POPLITEAL ARTERY;  Surgeon: Serafina Mitchell, MD;  Location: [facility];  Service: Vascular;  Laterality: Right;   FEMORAL-POPLITEAL BYPASS GRAFT Right 09/08/2017   Procedure: REDO BYPASS GRAFT  FEMORAL-POPLITEAL ARTERY;  Surgeon: Serafina Mitchell, MD;  Location: [facility];  Service: Vascular;  Laterality: Right;   FEMORAL-POPLITEAL BYPASS GRAFT Right 07/06/2018   Procedure: REVISION RIGHT FEMORAL TO POPLITEAL ARTERY BYPASS GRAFT;  Surgeon: Serafina Mitchell, MD;  Location: [place];  Service: Vascular;  Laterality: Right;   IR CHOLANGIOGRAM EXISTING TUBE  08/04/2018   IR EXCHANGE BILIARY DRAIN  01/22/2019   IR PERC CHOLECYSTOSTOMY  07/19/2018   IR RADIOLOGIST EVAL & MGMT  08/31/2018   IR RADIOLOGIST EVAL & MGMT  01/17/2019   IR RADIOLOGIST EVAL & MGMT  02/01/2019   IR RADIOLOGIST EVAL & MGMT  02/08/2019   KNEE ARTHROSCOPY     left   LOWER EXTREMITY ANGIOGRAM N/A 05/14/2014   Procedure: LOWER EXTREMITY ANGIOGRAM;  Surgeon: Lorretta Harp, MD;  Location: [facility] CATH LAB;  Service: Cardiovascular;  Laterality: N/A;   LOWER EXTREMITY ANGIOGRAPHY N/A 09/21/2016   Procedure: Lower Extremity Angiography;  Surgeon: Conrad Denison, MD;  Location: [place] [facility];  Service: Cardiovascular;  Laterality: N/A;   PERIPHERAL VASCULAR ATHERECTOMY Right 01/27/2017   Procedure: PERIPHERAL VASCULAR ATHERECTOMY;  Surgeon: Serafina Mitchell, MD;  Location: [place] [facility];  Service: Cardiovascular;  Laterality: Right;   PERIPHERAL VASCULAR BALLOON ANGIOPLASTY Right 06/08/2017   Procedure: PERIPHERAL VASCULAR BALLOON ANGIOPLASTY;  Surgeon: Serafina Mitchell, MD;  Location: [place] [facility];  Service: Cardiovascular;  Laterality: Right;  common femoral and superficial femoral arteries   PERIPHERAL VASCULAR CATHETERIZATION N/A 04/20/2016   Procedure: Lower Extremity Intervention;  Surgeon: Lorretta Harp, MD;  Location: [place] [facility];  Service: Cardiovascular;  Laterality: N/A;   PERIPHERAL VASCULAR INTERVENTION  08/31/2017   Procedure: PERIPHERAL VASCULAR INTERVENTION;  Surgeon: Serafina Mitchell, MD;  Location: [place] [facility];  Service: Cardiovascular;;  REIA   REVERSE SHOULDER ARTHROPLASTY Right 12/07/2016    REVERSE SHOULDER ARTHROPLASTY Right 12/07/2016   Procedure: REVERSE SHOULDER ARTHROPLASTY;  Surgeon: Netta Cedars, MD;  Location: [place];  Service: Orthopedics;  Laterality: Right;   ROTATOR CUFF REPAIR Right 2003   SFA Right 05/14/2014   PTA  OF RT SFA         DR BERRY   Patient Active Problem List   Diagnosis Date Noted   Exudative age-related macular degeneration of left eye with inactive choroidal neovascularization ([place]) 03/25/2020   Exudative age-related macular degeneration of right eye with inactive choroidal neovascularization ([place]) 03/25/2020   Advanced nonexudative age-related macular degeneration of right eye with subfoveal involvement 03/25/2020   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 involvement 03/25/2020   Bacteremia due to Gram-negative bacteria 01/19/2019   CKD (chronic kidney disease) stage 3, GFR 30-59 ml/min ([facility]) 01/19/2019   Stenosis of infrarenal abdominal aorta due to atherosclerosis ([place]) 07/06/2018   S/P shoulder replacement, right 12/07/2016   PVD (  peripheral vascular disease) ([place]) 09/18/2016   Sinus tachycardia 05/15/2014   Carotid artery disease ([place]) 04/26/2014   PAD (peripheral artery disease) ([place]) 06/12/2013   RBBB (right bundle branch block with left anterior fascicular block) 06/12/2013   Claudication ([place]) 12/14/2012   Essential hypertension 12/14/2012   Hyperlipidemia 12/14/2012   Type 2 diabetes mellitus ([place]) 12/14/2012   Personal history of colonic polyps 01/25/2012   DM 08/11/2010   DUODENAL ULCER, ACUTE, HEMORRHAGE 08/11/2010    ONSET DATE: "quite some time"  REFERRING DIAG: R26.81 (ICD-10-CM) - Unsteadiness on feet  THERAPY DIAG:  Unsteadiness on feet  Difficulty in walking, not elsewhere classified  Other abnormalities of gait and mobility  Rationale for Evaluation and Treatment: Rehabilitation  SUBJECTIVE:                                                                                                                                                                                              SUBJECTIVE STATEMENT: No changes, no falls.  Have been doing exercises, and not sure that the balance is changing.  I do begin to get pain with only a little activity.  Pt accompanied by: self  PERTINENT HISTORY: hypertension, hyperlipidemia and noninsulin-requiring diabetes, legally blind  PAIN:  Are you having pain? No  PRECAUTIONS: Fall  WEIGHT BEARING RESTRICTIONS: No  FALLS: Has patient fallen in last 6 months? Yes. Number of falls 3  LIVING ENVIRONMENT: Lives with: lives with their family and lives with their spouse Lives in: House/apartment Stairs: Yes: Internal: yes but not used steps; bilateral but cannot reach both and External: 3 steps; bilateral but cannot reach both Has following equipment at home: Naomi - 4 wheeled  PLOF: Independent  PATIENT GOALS: improve balance, stamina  OBJECTIVE:   TODAY'S TREATMENT: 09/11/2022 Activity Comments  Reviewed HEP, with pt return demo understanding Min cues for pace, technique; overall good performance of HEP  Seated march, 10 reps   Seated heel/toe raises x 10 reps Good technique  With standing corner balance exercises, provided cues for light UE support to help with improved feedback for stability/steadiness   Runner's stretch, 3 x 30 sec   Forward/back walking at counter, 5 reps   Tandem gait forward and back x 3 reps  Mild unsteadiness  Gait training with small quad tip cane, 2:30 in clinic with supervision More unsteady with turns  Additional gait training with cane with focus on short distance gait and turns Pt tends to pivot turn; tried walking around planted cane and tried marching turn, both appear unsteady-will need more practice         [place]  Last updated: 08/21/22 Access Code: [email or web address] URL: [email or web address] Date: 08/21/2022 Prepared by: [facility]  Exercises - Corner Balance Feet Together With Eyes Open  - 1 x daily - 7 x weekly - 3 sets - 30 sec hold - Corner Balance Feet Together With Eyes Closed  - 1 x daily - 7 x weekly - 3 sets - 30 sec hold - Semi-Tandem Corner Balance With Eyes Open  - 1 x daily - 7 x weekly - 3 sets - 15 sec hold - Seated Long Arc Quad with Ankle Weight  - 1 x daily - 7 x weekly - 3 sets - 15-18 reps - 2 sec hold - Standing Hip Flexion  - 1 x daily - 7 x weekly - 3 sets - 15-18 reps - Side Stepping with Counter Support  - 1 x daily - 7 x weekly - 1-3 sets - 2 min rounds hold - Wall Squat  - 1 x daily - 5 x weekly - 2 sets - 10 reps     Below measures were taken at time of initial evaluation unless otherwise specified:  DIAGNOSTIC FINDINGS:   COGNITION: Overall cognitive status: Within functional limits for tasks assessed   SENSATION: Not tested  COORDINATION: WFL  EDEMA:  None present  MUSCLE TONE: NT  MUSCLE LENGTH: NT  DTRs:  NT  POSTURE: No Significant postural limitations    BED MOBILITY:  DNT but pt reports incr difficulty with bed mobility  TRANSFERS: Assistive device utilized: None  Sit to stand: Complete Independence and Modified independence Stand to sit: Complete Independence and Modified independence Chair to chair: Complete Independence and Modified independence Floor: Modified independence and SBA, requires UE support on furniture. Unable to bend over to pick up item from ground. Kicks object over towards object that he can use of UE support and assumes half-kneeling    CURB:  Level of Assistance: Modified independence and SBA Assistive device utilized: None Curb Comments: must stop and "size up" the curb  STAIRS: Level of Assistance: Modified independence Stair Negotiation Technique: Alternating Pattern  with Bilateral Rails Number of Stairs: 12  Height of Stairs: 4-6"  Comments:   GAIT: Gait pattern:  decr pelvic rotation and  decreased trunk rotation, ankles maintained in DF throughout initial contact and loading response Distance walked: 315 (2MWT) Assistive device utilized: None Level of assistance: Complete Independence and SBA Comments: unsteady during turns  FUNCTIONAL TESTS:  5 times sit to stand: 13.44 Timed up and go (TUG): 14.75 2 minute walk test: vitals at start 98%, 94 bpm 315 ft and rates 13/20 Borg exertion (somewhat hard) 97% and 110 bpm Berg Balance Scale: 37/56 2.6 ft/sec speed based on 2MWT performance  M-CTSIB  Condition 1: Firm Surface, EO 30 Sec, Normal and Mild Sway  Condition 2: Firm Surface, EC 30 Sec, Normal and Mild Sway  Condition 3: Foam Surface, EO 30 Sec, Moderate and Severe Sway  Condition 4: Foam Surface, EC 4 Sec, Moderate and Severe Sway          GOALS: Goals reviewed with patient? Yes  SHORT TERM GOALS: Target date: 09/02/2022    Independent with HEP to improve functional outcomes Baseline:initiated Goal status: GOAL MET, 09/11/2022  2.  Teach-back principles of exercise for improved muscular endurance in order to combat effects of vascular claudication Baseline: able to initiating verbalizing technique Goal status: IN PROGRESS  3.  Demonstrate improved safety with gait/mobility per time of 10 sec  TUG test Baseline: 13 sec>12.84 sec 09/11/2022 Goal status: GOAL NOT MET, 09/11/2022    LONG TERM GOALS: Target date: 09/23/2022    Demonstrate low risk for falls per score 50/56 Berg Balance Test Baseline: 37/56 Goal status: IN PROGRESS  2.  Manifest improved gait tolerance/endurance per distance of 470 ft during 2MWT with RPE not exceeding 11/20 Baseline: 315 ft w/ RPE 13/20 Borg Goal status: IN PROGRESS  3.  Independent with advanced HEP to address patient's range of deficits/limitations Baseline:  Goal status: IN PROGRESS  4. Improve body mechanics/flexibility to enable bending/stooping/squatting to pick up items from ground  Baseline: Unable  Goal  status: IN PROGRESS  ASSESSMENT:  CLINICAL IMPRESSION: Assessed STGs, with pt meeting STG1 ; STG 2 ongoing and STG 3 not yet met.  Pt reports continued issues with balance and pain with short distance gait and balance activities.  Continued to work on dynamic balance and promoted benefits of cane for optimal gait pattern.  He does not have complaints during session and will continue to benefit from skilled PT towards LTGs.  OBJECTIVE IMPAIRMENTS: Abnormal gait, cardiopulmonary status limiting activity, decreased activity tolerance, decreased endurance, decreased knowledge of use of DME, decreased mobility, difficulty walking, decreased strength, impaired flexibility, and improper body mechanics.   ACTIVITY LIMITATIONS: carrying, lifting, bending, squatting, transfers, bed mobility, reach over head, and locomotion level  PARTICIPATION LIMITATIONS: meal prep, cleaning, laundry, interpersonal relationship, community activity, and yard work  PERSONAL FACTORS: Age, Fitness, Past/current experiences, Time since onset of injury/illness/exacerbation, and interaction of conditions  are also affecting patient's functional outcome.   REHAB POTENTIAL: Good  CLINICAL DECISION MAKING: Evolving/moderate complexity  EVALUATION COMPLEXITY: Moderate  PLAN:  PT FREQUENCY: 2x/week  PT DURATION: 6 weeks  PLANNED INTERVENTIONS: Therapeutic exercises, Therapeutic activity, Neuromuscular re-education, Balance training, Gait training, Patient/Family education, Self Care, Joint mobilization, Stair training, Vestibular training, Canalith repositioning, DME instructions, Aquatic Therapy, Dry Needling, Electrical stimulation, Spinal mobilization, Cryotherapy, Moist heat, Taping, Traction, Ionotophoresis 4mg /ml Dexamethasone, and Manual therapy  PLAN FOR NEXT SESSION:   10th Visit PN; continue to progress balance, review/reassess bed mobility, progressive resistance exercises for muscular endurance; gait training  with cane    Mady Haagensen, PT 09/11/22 11:54 AM Phone: [phone] Fax: [phone]   [place] at [facility] [address] [place], [place] [address] Phone # [phone] Fax # [phone]

## 2022-09-14 ENCOUNTER — Ambulatory Visit: Payer: Medicare Other | Admitting: Physical Therapy

## 2022-09-14 ENCOUNTER — Encounter: Payer: Self-pay | Admitting: Physical Therapy

## 2022-09-14 DIAGNOSIS — R2689 Other abnormalities of gait and mobility: Secondary | ICD-10-CM

## 2022-09-14 DIAGNOSIS — R2681 Unsteadiness on feet: Secondary | ICD-10-CM

## 2022-09-14 DIAGNOSIS — R262 Difficulty in walking, not elsewhere classified: Secondary | ICD-10-CM | POA: Diagnosis not present

## 2022-09-15 DIAGNOSIS — N1832 Chronic kidney disease, stage 3b: Secondary | ICD-10-CM | POA: Diagnosis not present

## 2022-09-15 DIAGNOSIS — N281 Cyst of kidney, acquired: Secondary | ICD-10-CM | POA: Diagnosis not present

## 2022-09-18 ENCOUNTER — Ambulatory Visit: Payer: Medicare Other

## 2022-09-18 DIAGNOSIS — R2681 Unsteadiness on feet: Secondary | ICD-10-CM | POA: Diagnosis not present

## 2022-09-18 DIAGNOSIS — R2689 Other abnormalities of gait and mobility: Secondary | ICD-10-CM | POA: Diagnosis not present

## 2022-09-18 DIAGNOSIS — R262 Difficulty in walking, not elsewhere classified: Secondary | ICD-10-CM

## 2022-09-18 NOTE — Therapy (Signed)
OUTPATIENT PHYSICAL THERAPY NEURO TREATMENT   Patient Name: Christian Bailey MRN: [id number] DOB:31-Aug-1940, 82 y.o., male Today's Date: 09/18/2022   PCP: Prince Solian, MD REFERRING PROVIDER: Prince Solian, MD   END OF SESSION:  PT End of Session - 09/18/22 1059     Visit Number 11    Number of Visits 12    Date for PT Re-Evaluation 09/23/22    Authorization Type Medicare A&B / [employer] for life    Progress Note Due on Visit 10    PT Start Time 1100    PT Stop Time 1145    PT Time Calculation (min) 45 min    Equipment Utilized During Treatment Gait belt    Activity Tolerance Patient tolerated treatment well    Behavior During Therapy WFL for tasks assessed/performed                 Past Medical History:  Diagnosis Date   Anemia    Complication of anesthesia    Diabetes mellitus ([place])    TYPE 2   GERD (gastroesophageal reflux disease)    History of blood transfusion    GI bleed   History of colon polyps    History of hiatal hernia    Hyperlipemia    Hypertension    Osteoarthritis    PAD (peripheral artery disease) ([place])    a. stenting of his left common iliac artery >20 years ago. b. h/o LEIA stent and 2 stents to R SFA in 2011. c. 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 Noted during admission 04/2014 but upon review seems to be frequent finding for patient.   Stenosis of carotid artery    a. 50% right carotid stenosis by angiogram 04/2014.   Past Surgical History:  Procedure Laterality Date   ABDOMINAL AORTOGRAM W/LOWER EXTREMITY N/A 01/27/2017   Procedure: Abdominal Aortogram w/Lower Extremity;  Surgeon:  Serafina Mitchell, MD;  Location: [facility];  Service: Cardiovascular;  Laterality: N/A;   ABDOMINAL AORTOGRAM W/LOWER EXTREMITY N/A 06/08/2017   Procedure: ABDOMINAL AORTOGRAM W/LOWER EXTREMITY;  Surgeon: Serafina Mitchell, MD;  Location: [facility];  Service: Cardiovascular;  Laterality: N/A;   ABDOMINAL AORTOGRAM W/LOWER EXTREMITY N/A 08/31/2017   Procedure: ABDOMINAL AORTOGRAM W/LOWER EXTREMITY;  Surgeon: Serafina Mitchell, MD;  Location: [facility];  Service: Cardiovascular;  Laterality: N/A;  rt. unilateral   ANGIOPLASTY / STENTING FEMORAL     ANGIOPLASTY / STENTING ILIAC     AORTA - BILATERAL FEMORAL ARTERY BYPASS GRAFT N/A 07/06/2018   Procedure: AORTA BIFEMORAL BYPASS GRAFT USING 14X7MM X 40CM HEMASHIELD GOLD GRAFT;  Surgeon: Serafina Mitchell, MD;  Location: [facility];  Service: Vascular;  Laterality: N/A;   CEREBRAL ANGIOGRAM N/A 05/14/2014   Procedure: CEREBRAL ANGIOGRAM;  Surgeon: Lorretta Harp, MD;  Location: [facility];  Service: Cardiovascular;  Laterality: N/A;   COLONOSCOPY W/ POLYPECTOMY     ENDARTERECTOMY Right 09/08/2017   ENDARTERECTOMY FEMORAL Right 09/08/2017   Procedure: REDO RIGHT FEMORAL ENDARTECTOMY WITH PATCH ANGIOPLASTY.;  Surgeon: Serafina Mitchell, MD;  Location: [facility];  Service: Vascular;  Laterality: Right;  EYE SURGERY Bilateral    cataract   FEMORAL ARTERY STENT Right 05/12/2010   Stented distally with a 6x100 Abbott absolute stent and proximally with a 6x60 Cook Zilver stent resulting in the reduction of the proximal segment 80% and mid segment 60-70% to 0% residual, LEFT common femoral artery stented with a 7x3 Smart stent resulting in reduction of 90% stenosis to 0% residual   FEMORAL-POPLITEAL BYPASS GRAFT Right 09/18/2016   Procedure: BYPASS GRAFT FEMORAL-POPLITEAL ARTERY;  Surgeon: Serafina Mitchell, MD;  Location: [facility];  Service: Vascular;  Laterality: Right;   FEMORAL-POPLITEAL BYPASS GRAFT Right 09/08/2017   Procedure: REDO BYPASS GRAFT  FEMORAL-POPLITEAL ARTERY;  Surgeon: Serafina Mitchell, MD;  Location: [facility];  Service: Vascular;  Laterality: Right;   FEMORAL-POPLITEAL BYPASS GRAFT Right 07/06/2018   Procedure: REVISION RIGHT FEMORAL TO POPLITEAL ARTERY BYPASS GRAFT;  Surgeon: Serafina Mitchell, MD;  Location: [facility];  Service: Vascular;  Laterality: Right;   IR CHOLANGIOGRAM EXISTING TUBE  08/04/2018   IR EXCHANGE BILIARY DRAIN  01/22/2019   IR PERC CHOLECYSTOSTOMY  07/19/2018   IR RADIOLOGIST EVAL & MGMT  08/31/2018   IR RADIOLOGIST EVAL & MGMT  01/17/2019   IR RADIOLOGIST EVAL & MGMT  02/01/2019   IR RADIOLOGIST EVAL & MGMT  02/08/2019   KNEE ARTHROSCOPY     left   LOWER EXTREMITY ANGIOGRAM N/A 05/14/2014   Procedure: LOWER EXTREMITY ANGIOGRAM;  Surgeon: Lorretta Harp, MD;  Location: [facility] CATH LAB;  Service: Cardiovascular;  Laterality: N/A;   LOWER EXTREMITY ANGIOGRAPHY N/A 09/21/2016   Procedure: Lower Extremity Angiography;  Surgeon: Conrad Goldsby, MD;  Location: [facility];  Service: Cardiovascular;  Laterality: N/A;   PERIPHERAL VASCULAR ATHERECTOMY Right 01/27/2017   Procedure: PERIPHERAL VASCULAR ATHERECTOMY;  Surgeon: Serafina Mitchell, MD;  Location: [facility];  Service: Cardiovascular;  Laterality: Right;   PERIPHERAL VASCULAR BALLOON ANGIOPLASTY Right 06/08/2017   Procedure: PERIPHERAL VASCULAR BALLOON ANGIOPLASTY;  Surgeon: Serafina Mitchell, MD;  Location: [facility];  Service: Cardiovascular;  Laterality: Right;  common femoral and superficial femoral arteries   PERIPHERAL VASCULAR CATHETERIZATION N/A 04/20/2016   Procedure: Lower Extremity Intervention;  Surgeon: Lorretta Harp, MD;  Location: [facility];  Service: Cardiovascular;  Laterality: N/A;   PERIPHERAL VASCULAR INTERVENTION  08/31/2017   Procedure: PERIPHERAL VASCULAR INTERVENTION;  Surgeon: Serafina Mitchell, MD;  Location: [facility];  Service: Cardiovascular;;  REIA   REVERSE SHOULDER ARTHROPLASTY Right 12/07/2016    REVERSE SHOULDER ARTHROPLASTY Right 12/07/2016   Procedure: REVERSE SHOULDER ARTHROPLASTY;  Surgeon: Netta Cedars, MD;  Location: [facility];  Service: Orthopedics;  Laterality: Right;   ROTATOR CUFF REPAIR Right 2003   SFA Right 05/14/2014   PTA  OF RT SFA         DR BERRY   Patient Active Problem List   Diagnosis Date Noted   Exudative age-related macular degeneration of left eye with inactive choroidal neovascularization ([place]) 03/25/2020   Exudative age-related macular degeneration of right eye with inactive choroidal neovascularization ([place]) 03/25/2020   Advanced nonexudative age-related macular degeneration of right eye with subfoveal involvement 03/25/2020   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 involvement 03/25/2020   Bacteremia due to Gram-negative bacteria 01/19/2019   CKD (chronic kidney disease) stage 3, GFR 30-59 ml/min ([facility]) 01/19/2019   Stenosis of infrarenal abdominal aorta due to atherosclerosis ([place]) 07/06/2018   S/P shoulder replacement, right 12/07/2016  PVD (peripheral vascular disease) ([place]) 09/18/2016   Sinus tachycardia 05/15/2014   Carotid artery disease ([place]) 04/26/2014   PAD (peripheral artery disease) ([place]) 06/12/2013   RBBB (right bundle branch block with left anterior fascicular block) 06/12/2013   Claudication ([place]) 12/14/2012   Essential hypertension 12/14/2012   Hyperlipidemia 12/14/2012   Type 2 diabetes mellitus ([place]) 12/14/2012   Personal history of colonic polyps 01/25/2012   DM 08/11/2010   DUODENAL ULCER, ACUTE, HEMORRHAGE 08/11/2010    ONSET DATE: "quite some time"  REFERRING DIAG: R26.81 (ICD-10-CM) - Unsteadiness on feet  THERAPY DIAG:  Unsteadiness on feet  Difficulty in walking, not elsewhere classified  Other abnormalities of gait and mobility  Rationale for Evaluation and Treatment: Rehabilitation  SUBJECTIVE:                                                                                                                                                                                              SUBJECTIVE STATEMENT: Did extra walking in drive way last night and calf muscles are sore   Pt accompanied by: self  PERTINENT HISTORY: hypertension, hyperlipidemia and noninsulin-requiring diabetes, legally blind  PAIN:  Are you having pain? No  PRECAUTIONS: Fall  WEIGHT BEARING RESTRICTIONS: No  FALLS: Has patient fallen in last 6 months? Yes. Number of falls 3  LIVING ENVIRONMENT: Lives with: lives with their family and lives with their spouse Lives in: House/apartment Stairs: Yes: Internal: yes but not used steps; bilateral but cannot reach both and External: 3 steps; bilateral but cannot reach both Has following equipment at home: Walker - 4 wheeled  PLOF: Independent  PATIENT GOALS: improve balance, stamina  OBJECTIVE:   TODAY'S TREATMENT: 09/18/22 Activity Comments  NU-step level 4 x 8 min For LE endurance  LAQ 3x20 2# for endurance  Sidestepping x 3 min 2# for endurance  Marching 3x20 2# endurance  Standing on foam 2 min EO 5x10 sec EC        TODAY'S TREATMENT: 09/14/22 Activity Comments  TUG 12.57 sec with arms folded across chest throughout   Berg 39/56  2MWT Pre test:97% spO2, 72 bpm, 134/62 mmHg   Post test: 161/61 mmHg, 96 & spO2, 100bpm Completed 340 ft without AD C/o 4/10 LE pain and 15/20 on Borg scale   Picking up item from the floor  Pt still unable; limited by c/o B thigh pain               PATIENT EDUCATION: Education details: edu on benefits of exercise for circulatory health; edu on exam findings and remaining impairments; edu on continuing to work on Editor, commissioning  and walking regimen(shorter, more frequent walks d/t PAD pain) for max benefit  Person educated: Patient Education method: Explanation Education comprehension: verbalized understanding   HOME EXERCISE PROGRAM Last updated: 08/21/22 Access Code:  [email or web address] URL: [email or web address] Date: 08/21/2022 Prepared by: [facility]  Exercises - Corner Balance Feet Together With Eyes Open  - 1 x daily - 7 x weekly - 3 sets - 30 sec hold - Corner Balance Feet Together With Eyes Closed  - 1 x daily - 7 x weekly - 3 sets - 30 sec hold - Semi-Tandem Corner Balance With Eyes Open  - 1 x daily - 7 x weekly - 3 sets - 15 sec hold - Seated Long Arc Quad with Ankle Weight  - 1 x daily - 7 x weekly - 3 sets - 15-18 reps - 2 sec hold - Standing Hip Flexion  - 1 x daily - 7 x weekly - 3 sets - 15-18 reps - Side Stepping with Counter Support  - 1 x daily - 7 x weekly - 1-3 sets - 2 min rounds hold - Wall Squat  - 1 x daily - 5 x weekly - 2 sets - 10 reps     Below measures were taken at time of initial evaluation unless otherwise specified:  DIAGNOSTIC FINDINGS:   COGNITION: Overall cognitive status: Within functional limits for tasks assessed   SENSATION: Not tested  COORDINATION: WFL  EDEMA:  None present  MUSCLE TONE: NT  MUSCLE LENGTH: NT  DTRs:  NT  POSTURE: No Significant postural limitations    BED MOBILITY:  DNT but pt reports incr difficulty with bed mobility  TRANSFERS: Assistive device utilized: None  Sit to stand: Complete Independence and Modified independence Stand to sit: Complete Independence and Modified independence Chair to chair: Complete Independence and Modified independence Floor: Modified independence and SBA, requires UE support on furniture. Unable to bend over to pick up item from ground. Kicks object over towards object that he can use of UE support and assumes half-kneeling    CURB:  Level of Assistance: Modified independence and SBA Assistive device utilized: None Curb Comments: must stop and "size up" the curb  STAIRS: Level of Assistance: Modified independence Stair Negotiation Technique: Alternating Pattern  with Bilateral  Rails Number of Stairs: 12  Height of Stairs: 4-6"  Comments:   GAIT: Gait pattern:  decr pelvic rotation and decreased trunk rotation, ankles maintained in DF throughout initial contact and loading response Distance walked: 315 (2MWT) Assistive device utilized: None Level of assistance: Complete Independence and SBA Comments: unsteady during turns  FUNCTIONAL TESTS:  5 times sit to stand: 13.44 Timed up and go (TUG): 14.75 2 minute walk test: vitals at start 98%, 94 bpm 315 ft and rates 13/20 Borg exertion (somewhat hard) 97% and 110 bpm Berg Balance Scale: 37/56 2.6 ft/sec speed based on 2MWT performance  M-CTSIB  Condition 1: Firm Surface, EO 30 Sec, Normal and Mild Sway  Condition 2: Firm Surface, EC 30 Sec, Normal and Mild Sway  Condition 3: Foam Surface, EO 30 Sec, Moderate and Severe Sway  Condition 4: Foam Surface, EC 4 Sec, Moderate and Severe Sway          GOALS: Goals reviewed with patient? Yes  SHORT TERM GOALS: Target date: 09/02/2022    Independent with HEP to improve functional outcomes Baseline:initiated; denies questions 09/14/22 Goal status: MET 09/14/22  2.  Teach-back principles of exercise for improved muscular endurance in  order to combat effects of vascular claudication Baseline:  Goal status: MET 09/14/22  3.  Demonstrate improved safety with gait/mobility per time of 10 sec TUG test Baseline: 13 sec; 12.57 sec 09/14/22 Goal status: IN PROGRESS 09/14/22    LONG TERM GOALS: Target date: 09/23/2022    Demonstrate low risk for falls per score 50/56 Berg Balance Test Baseline: 37/56; 39/56 09/14/22 Goal status: IN PROGRESS 09/14/22  2.  Manifest improved gait tolerance/endurance per distance of 470 ft during 2MWT with RPE not exceeding 11/20 Baseline: 315 ft w/ RPE 13/20 Borg; 340 ft with RPE 15/20 Goal status: IN PROGRESS 09/14/22  3.  Independent with advanced HEP to address patient's range of deficits/limitations Baseline:  Goal status: IN  PROGRESS 09/14/22  4. Improve body mechanics/flexibility to enable bending/stooping/squatting to pick up items from ground  Baseline: Unable; unable d/t B thigh pain limiting 09/14/22  Goal status: IN PROGRESS 09/14/22  ASSESSMENT:  CLINICAL IMPRESSION: Emphasis on incr muscular endurance for HEP performance. Varied performance on compliant surfaces w/ eyes closed conditions. Continue x 1 visit for re-assessment and HEP refinement   OBJECTIVE IMPAIRMENTS: Abnormal gait, cardiopulmonary status limiting activity, decreased activity tolerance, decreased endurance, decreased knowledge of use of DME, decreased mobility, difficulty walking, decreased strength, impaired flexibility, and improper body mechanics.   ACTIVITY LIMITATIONS: carrying, lifting, bending, squatting, transfers, bed mobility, reach over head, and locomotion level  PARTICIPATION LIMITATIONS: meal prep, cleaning, laundry, interpersonal relationship, community activity, and yard work  PERSONAL FACTORS: Age, Fitness, Past/current experiences, Time since onset of injury/illness/exacerbation, and interaction of conditions  are also affecting patient's functional outcome.   REHAB POTENTIAL: Good  CLINICAL DECISION MAKING: Evolving/moderate complexity  EVALUATION COMPLEXITY: Moderate  PLAN:  PT FREQUENCY: 2x/week  PT DURATION: 6 weeks  PLANNED INTERVENTIONS: Therapeutic exercises, Therapeutic activity, Neuromuscular re-education, Balance training, Gait training, Patient/Family education, Self Care, Joint mobilization, Stair training, Vestibular training, Canalith repositioning, DME instructions, Aquatic Therapy, Dry Needling, Electrical stimulation, Spinal mobilization, Cryotherapy, Moist heat, Taping, Traction, Ionotophoresis 4mg /ml Dexamethasone, and Manual therapy  PLAN FOR NEXT SESSION: update HEP with more LE strengthening activities; pt reports he will be ready for DC within coming sessions; progressive resistance  exercises for muscular endurance; gait training with cane    12:00 PM, 09/18/22 M. Sherlyn Lees, PT, DPT Physical Therapist- [place] Office Number: [phone]

## 2022-09-21 ENCOUNTER — Ambulatory Visit: Payer: Medicare Other

## 2022-09-21 DIAGNOSIS — R262 Difficulty in walking, not elsewhere classified: Secondary | ICD-10-CM | POA: Diagnosis not present

## 2022-09-21 DIAGNOSIS — R2681 Unsteadiness on feet: Secondary | ICD-10-CM

## 2022-09-21 DIAGNOSIS — R2689 Other abnormalities of gait and mobility: Secondary | ICD-10-CM | POA: Diagnosis not present

## 2022-09-21 NOTE — Therapy (Signed)
OUTPATIENT PHYSICAL THERAPY NEURO TREATMENT and D/C Summary   Patient Name: Christian Bailey MRN: [id number] DOB:Nov 10, 1940, 82 y.o., male Today's Date: 09/21/2022   PCP: Prince Solian, MD REFERRING PROVIDER: Prince Solian, MD  PHYSICAL THERAPY DISCHARGE SUMMARY  Visits from Start of Care: 12  Current functional level related to goals / functional outcomes: Goals partially met   Remaining deficits: ROM deifcits limiting ability to bend/stoop/squat. Low risk for falls per TUG, high per [employer] / Equipment: HEP   Patient agrees to discharge. Patient goals were partially met. Patient is being discharged due to being pleased with the current functional level.   END OF SESSION:  PT End of Session - 09/21/22 0844     Visit Number 12    Number of Visits 12    Date for PT Re-Evaluation 09/23/22    Authorization Type Medicare A&B / Tricare for life    Progress Note Due on Visit 10    PT Start Time 0845    PT Stop Time 0930    PT Time Calculation (min) 45 min    Equipment Utilized During Treatment Gait belt    Activity Tolerance Patient tolerated treatment well    Behavior During Therapy WFL for tasks assessed/performed                 Past Medical History:  Diagnosis Date   Anemia    Complication of anesthesia    Diabetes mellitus ([place])    TYPE 2   GERD (gastroesophageal reflux disease)    History of blood transfusion    GI bleed   History of colon polyps    History of hiatal hernia    Hyperlipemia    Hypertension    Osteoarthritis    PAD (peripheral artery disease) ([place])    a. stenting of his left common iliac artery >20 years ago. b. h/o LEIA stent and 2 stents to R SFA in 2011. c. 04/2014:  s/p PTA of right SFA for in-stent restenosis, occluded left SFA   PONV (postoperative nausea and vomiting)    no porblem with the last 3 surgeries   RBBB (right bundle branch block with left anterior fascicular block)    NUCLEAR STRESS TEST,  08/18/2010 - no significant wall motion abnoramlities noted, post-stress EF 69%, normal myocardial perfusion study   Sinus tachycardia    a. Noted during admission 04/2014 but upon review seems to be frequent finding for patient.   Stenosis of carotid artery    a. 50% right carotid stenosis by angiogram 04/2014.   Past Surgical History:  Procedure Laterality Date   ABDOMINAL AORTOGRAM W/LOWER EXTREMITY N/A 01/27/2017   Procedure: Abdominal Aortogram w/Lower Extremity;  Surgeon: Serafina Mitchell, MD;  Location: [facility];  Service: Cardiovascular;  Laterality: N/A;   ABDOMINAL AORTOGRAM W/LOWER EXTREMITY N/A 06/08/2017   Procedure: ABDOMINAL AORTOGRAM W/LOWER EXTREMITY;  Surgeon: Serafina Mitchell, MD;  Location: [facility];  Service: Cardiovascular;  Laterality: N/A;   ABDOMINAL AORTOGRAM W/LOWER EXTREMITY N/A 08/31/2017   Procedure: ABDOMINAL AORTOGRAM W/LOWER EXTREMITY;  Surgeon: Serafina Mitchell, MD;  Location: [facility];  Service: Cardiovascular;  Laterality: N/A;  rt. unilateral   ANGIOPLASTY / STENTING FEMORAL     ANGIOPLASTY / STENTING ILIAC     AORTA - BILATERAL FEMORAL ARTERY BYPASS GRAFT N/A 07/06/2018   Procedure: AORTA BIFEMORAL BYPASS GRAFT USING 14X7MM X 40CM HEMASHIELD GOLD GRAFT;  Surgeon: Serafina Mitchell, MD;  Location: [facility]  OR;  Service: Vascular;  Laterality: N/A;   CEREBRAL ANGIOGRAM N/A 05/14/2014   Procedure: CEREBRAL ANGIOGRAM;  Surgeon: Lorretta Harp, MD;  Location: [facility] CATH LAB;  Service: Cardiovascular;  Laterality: N/A;   COLONOSCOPY W/ POLYPECTOMY     ENDARTERECTOMY Right 09/08/2017   ENDARTERECTOMY FEMORAL Right 09/08/2017   Procedure: REDO RIGHT FEMORAL ENDARTECTOMY WITH PATCH ANGIOPLASTY.;  Surgeon: Serafina Mitchell, MD;  Location: [facility] OR;  Service: Vascular;  Laterality: Right;   EYE SURGERY Bilateral    cataract   FEMORAL ARTERY STENT Right 05/12/2010   Stented distally with a 6x100 Abbott absolute stent and proximally with a 6x60 Cook  Zilver stent resulting in the reduction of the proximal segment 80% and mid segment 60-70% to 0% residual, LEFT common femoral artery stented with a 7x3 Smart stent resulting in reduction of 90% stenosis to 0% residual   FEMORAL-POPLITEAL BYPASS GRAFT Right 09/18/2016   Procedure: BYPASS GRAFT FEMORAL-POPLITEAL ARTERY;  Surgeon: Serafina Mitchell, MD;  Location: [facility] OR;  Service: Vascular;  Laterality: Right;   FEMORAL-POPLITEAL BYPASS GRAFT Right 09/08/2017   Procedure: REDO BYPASS GRAFT FEMORAL-POPLITEAL ARTERY;  Surgeon: Serafina Mitchell, MD;  Location: [facility] OR;  Service: Vascular;  Laterality: Right;   FEMORAL-POPLITEAL BYPASS GRAFT Right 07/06/2018   Procedure: REVISION RIGHT FEMORAL TO POPLITEAL ARTERY BYPASS GRAFT;  Surgeon: Serafina Mitchell, MD;  Location: [facility];  Service: Vascular;  Laterality: Right;   IR CHOLANGIOGRAM EXISTING TUBE  08/04/2018   IR EXCHANGE BILIARY DRAIN  01/22/2019   IR PERC CHOLECYSTOSTOMY  07/19/2018   IR RADIOLOGIST EVAL & MGMT  08/31/2018   IR RADIOLOGIST EVAL & MGMT  01/17/2019   IR RADIOLOGIST EVAL & MGMT  02/01/2019   IR RADIOLOGIST EVAL & MGMT  02/08/2019   KNEE ARTHROSCOPY     left   LOWER EXTREMITY ANGIOGRAM N/A 05/14/2014   Procedure: LOWER EXTREMITY ANGIOGRAM;  Surgeon: Lorretta Harp, MD;  Location: [facility] CATH LAB;  Service: Cardiovascular;  Laterality: N/A;   LOWER EXTREMITY ANGIOGRAPHY N/A 09/21/2016   Procedure: Lower Extremity Angiography;  Surgeon: Conrad [place], MD;  Location: [facility];  Service: Cardiovascular;  Laterality: N/A;   PERIPHERAL VASCULAR ATHERECTOMY Right 01/27/2017   Procedure: PERIPHERAL VASCULAR ATHERECTOMY;  Surgeon: Serafina Mitchell, MD;  Location: [facility];  Service: Cardiovascular;  Laterality: Right;   PERIPHERAL VASCULAR BALLOON ANGIOPLASTY Right 06/08/2017   Procedure: PERIPHERAL VASCULAR BALLOON ANGIOPLASTY;  Surgeon: Serafina Mitchell, MD;  Location: [facility];  Service: Cardiovascular;  Laterality: Right;  common  femoral and superficial femoral arteries   PERIPHERAL VASCULAR CATHETERIZATION N/A 04/20/2016   Procedure: Lower Extremity Intervention;  Surgeon: Lorretta Harp, MD;  Location: [facility];  Service: Cardiovascular;  Laterality: N/A;   PERIPHERAL VASCULAR INTERVENTION  08/31/2017   Procedure: PERIPHERAL VASCULAR INTERVENTION;  Surgeon: Serafina Mitchell, MD;  Location: [facility];  Service: Cardiovascular;;  REIA   REVERSE SHOULDER ARTHROPLASTY Right 12/07/2016   REVERSE SHOULDER ARTHROPLASTY Right 12/07/2016   Procedure: REVERSE SHOULDER ARTHROPLASTY;  Surgeon: Netta Cedars, MD;  Location: [facility];  Service: Orthopedics;  Laterality: Right;   ROTATOR CUFF REPAIR Right 2003   SFA Right 05/14/2014   PTA  OF RT SFA         DR BERRY   Patient Active Problem List   Diagnosis Date Noted   Exudative age-related macular degeneration of left eye with inactive choroidal neovascularization ([place]) 03/25/2020   Exudative age-related  macular degeneration of right eye with inactive choroidal neovascularization ([place]) 03/25/2020   Advanced nonexudative age-related macular degeneration of right eye with subfoveal involvement 03/25/2020   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 involvement 03/25/2020   Bacteremia due to Gram-negative bacteria 01/19/2019   CKD (chronic kidney disease) stage 3, GFR 30-59 ml/min (HCC) 01/19/2019   Stenosis of infrarenal abdominal aorta due to atherosclerosis ([place]) 07/06/2018   S/P shoulder replacement, right 12/07/2016   PVD (peripheral vascular disease) ([place]) 09/18/2016   Sinus tachycardia 05/15/2014   Carotid artery disease ([place]) 04/26/2014   PAD (peripheral artery disease) ([place]) 06/12/2013   RBBB (right bundle branch block with left anterior fascicular block) 06/12/2013   Claudication ([place]) 12/14/2012   Essential hypertension 12/14/2012   Hyperlipidemia 12/14/2012   Type 2 diabetes mellitus ([place]) 12/14/2012    Personal history of colonic polyps 01/25/2012   DM 08/11/2010   DUODENAL ULCER, ACUTE, HEMORRHAGE 08/11/2010    ONSET DATE: "quite some time"  REFERRING DIAG: R26.81 (ICD-10-CM) - Unsteadiness on feet  THERAPY DIAG:  Unsteadiness on feet  Difficulty in walking, not elsewhere classified  Other abnormalities of gait and mobility  Rationale for Evaluation and Treatment: Rehabilitation  SUBJECTIVE:                                                                                                                                                                                             SUBJECTIVE STATEMENT: Did some extra walking in driveway over the weekend. Able to walk 300 paces x 3 rounds  Pt accompanied by: self  PERTINENT HISTORY: hypertension, hyperlipidemia and noninsulin-requiring diabetes, legally blind  PAIN:  Are you having pain? No  PRECAUTIONS: Fall  WEIGHT BEARING RESTRICTIONS: No  FALLS: Has patient fallen in last 6 months? Yes. Number of falls 3  LIVING ENVIRONMENT: Lives with: lives with their family and lives with their spouse Lives in: House/apartment Stairs: Yes: Internal: yes but not used steps; bilateral but cannot reach both and External: 3 steps; bilateral but cannot reach both Has following equipment at home: Walker - 4 wheeled  PLOF: Independent  PATIENT GOALS: improve balance, stamina  OBJECTIVE:  TODAY'S TREATMENT: 09/21/22 Activity Comments  NU-step level 6 x 6 min   2MWT 340 ft, 2/10 RPE  TUG test 10 sec  HEP review indep  Balance  On foam, various conditions EO/EC           PATIENT EDUCATION: Education details: edu on benefits of exercise for circulatory health; edu on exam findings and remaining impairments; edu on continuing to work on Editor, commissioning and walking regimen(shorter, more frequent walks  d/t PAD pain) for max benefit  Person educated: Patient Education method: Explanation Education comprehension: verbalized  understanding   HOME EXERCISE PROGRAM Last updated: 08/21/22 Access Code: [email or web address] URL: [email or web address] Date: 08/21/2022 Prepared by: [facility]  Exercises - Corner Balance Feet Together With Eyes Open  - 1 x daily - 7 x weekly - 3 sets - 30 sec hold - Corner Balance Feet Together With Eyes Closed  - 1 x daily - 7 x weekly - 3 sets - 30 sec hold - Semi-Tandem Corner Balance With Eyes Open  - 1 x daily - 7 x weekly - 3 sets - 15 sec hold - Seated Long Arc Quad with Ankle Weight  - 1 x daily - 7 x weekly - 3 sets - 15-18 reps - 2 sec hold - Standing Hip Flexion  - 1 x daily - 7 x weekly - 3 sets - 15-18 reps - Side Stepping with Counter Support  - 1 x daily - 7 x weekly - 1-3 sets - 2 min rounds hold - Wall Squat  - 1 x daily - 5 x weekly - 2 sets - 10 reps     Below measures were taken at time of initial evaluation unless otherwise specified:  DIAGNOSTIC FINDINGS:   COGNITION: Overall cognitive status: Within functional limits for tasks assessed   SENSATION: Not tested  COORDINATION: WFL  EDEMA:  None present  MUSCLE TONE: NT  MUSCLE LENGTH: NT  DTRs:  NT  POSTURE: No Significant postural limitations    BED MOBILITY:  DNT but pt reports incr difficulty with bed mobility  TRANSFERS: Assistive device utilized: None  Sit to stand: Complete Independence and Modified independence Stand to sit: Complete Independence and Modified independence Chair to chair: Complete Independence and Modified independence Floor: Modified independence and SBA, requires UE support on furniture. Unable to bend over to pick up item from ground. Kicks object over towards object that he can use of UE support and assumes half-kneeling    CURB:  Level of Assistance: Modified independence and SBA Assistive device utilized: None Curb Comments: must stop and "size up" the curb  STAIRS: Level of Assistance: Modified  independence Stair Negotiation Technique: Alternating Pattern  with Bilateral Rails Number of Stairs: 12  Height of Stairs: 4-6"  Comments:   GAIT: Gait pattern:  decr pelvic rotation and decreased trunk rotation, ankles maintained in DF throughout initial contact and loading response Distance walked: 315 (2MWT) Assistive device utilized: None Level of assistance: Complete Independence and SBA Comments: unsteady during turns  FUNCTIONAL TESTS:  5 times sit to stand: 13.44 Timed up and go (TUG): 14.75 2 minute walk test: vitals at start 98%, 94 bpm 315 ft and rates 13/20 Borg exertion (somewhat hard) 97% and 110 bpm Berg Balance Scale: 37/56 2.6 ft/sec speed based on 2MWT performance  M-CTSIB  Condition 1: Firm Surface, EO 30 Sec, Normal and Mild Sway  Condition 2: Firm Surface, EC 30 Sec, Normal and Mild Sway  Condition 3: Foam Surface, EO 30 Sec, Moderate and Severe Sway  Condition 4: Foam Surface, EC 4 Sec, Moderate and Severe Sway          GOALS: Goals reviewed with patient? Yes  SHORT TERM GOALS: Target date: 09/02/2022    Independent with HEP to improve functional outcomes Baseline:initiated; denies questions 09/14/22 Goal status: MET 09/14/22  2.  Teach-back principles of exercise for improved muscular endurance in order to combat effects of vascular  claudication Baseline:  Goal status: MET 09/14/22  3.  Demonstrate improved safety with gait/mobility per time of 10 sec TUG test Baseline: 13 sec; 12.57 sec 09/14/22; 10 sec Goal status: MET     LONG TERM GOALS: Target date: 09/23/2022    Demonstrate low risk for falls per score 50/56 Berg Balance Test Baseline: 37/56; 39/56 09/14/22 Goal status: NOT MET 09/14/22  2.  Manifest improved gait tolerance/endurance per distance of 470 ft during 2MWT with RPE not exceeding 11/20 Baseline: 315 ft w/ RPE 13/20 Borg; 340 ft with RPE 15/20 Goal status: NOT MET 09/14/22  3.  Independent with advanced HEP to address  patient's range of deficits/limitations Baseline:  Goal status: MET   4. Improve body mechanics/flexibility to enable bending/stooping/squatting to pick up items from ground  Baseline: Unable; unable d/t B thigh pain limiting 09/14/22  Goal status: NOT MET  ASSESSMENT:  CLINICAL IMPRESSION: POC details review and able to manifest consisent 2MWT performance with incr distance from start of care and demo low risk for falls per TUG test. Pt feels more confident in abilities and demo independence in HEP and reports improved compliance with walking program with nicer weather. D/C to HEP at this time.   OBJECTIVE IMPAIRMENTS: Abnormal gait, cardiopulmonary status limiting activity, decreased activity tolerance, decreased endurance, decreased knowledge of use of DME, decreased mobility, difficulty walking, decreased strength, impaired flexibility, and improper body mechanics.   ACTIVITY LIMITATIONS: carrying, lifting, bending, squatting, transfers, bed mobility, reach over head, and locomotion level  PARTICIPATION LIMITATIONS: meal prep, cleaning, laundry, interpersonal relationship, community activity, and yard work  PERSONAL FACTORS: Age, Fitness, Past/current experiences, Time since onset of injury/illness/exacerbation, and interaction of conditions  are also affecting patient's functional outcome.   REHAB POTENTIAL: Good  CLINICAL DECISION MAKING: Evolving/moderate complexity  EVALUATION COMPLEXITY: Moderate  PLAN:  PT FREQUENCY: 2x/week  PT DURATION: 6 weeks  PLANNED INTERVENTIONS: Therapeutic exercises, Therapeutic activity, Neuromuscular re-education, Balance training, Gait training, Patient/Family education, Self Care, Joint mobilization, Stair training, Vestibular training, Canalith repositioning, DME instructions, Aquatic Therapy, Dry Needling, Electrical stimulation, Spinal mobilization, Cryotherapy, Moist heat, Taping, Traction, Ionotophoresis 4mg /ml Dexamethasone, and Manual  therapy  PLAN FOR NEXT SESSION: D/C   8:44 AM, 09/21/22 M. Sherlyn Lees, PT, DPT Physical Therapist- [place] Office Number: [phone]

## 2022-09-23 ENCOUNTER — Ambulatory Visit: Payer: Medicare Other

## 2022-09-29 ENCOUNTER — Encounter (INDEPENDENT_AMBULATORY_CARE_PROVIDER_SITE_OTHER): Payer: Medicare Other | Admitting: Ophthalmology

## 2022-10-05 DIAGNOSIS — H353124 Nonexudative age-related macular degeneration, left eye, advanced atrophic with subfoveal involvement: Secondary | ICD-10-CM | POA: Diagnosis not present

## 2022-10-05 DIAGNOSIS — H353232 Exudative age-related macular degeneration, bilateral, with inactive choroidal neovascularization: Secondary | ICD-10-CM | POA: Diagnosis not present

## 2022-10-05 DIAGNOSIS — H353114 Nonexudative age-related macular degeneration, right eye, advanced atrophic with subfoveal involvement: Secondary | ICD-10-CM | POA: Diagnosis not present

## 2022-10-13 ENCOUNTER — Other Ambulatory Visit: Payer: Self-pay | Admitting: *Deleted

## 2022-10-13 DIAGNOSIS — I70213 Atherosclerosis of native arteries of extremities with intermittent claudication, bilateral legs: Secondary | ICD-10-CM

## 2022-10-16 ENCOUNTER — Ambulatory Visit (HOSPITAL_COMMUNITY)
Admission: RE | Admit: 2022-10-16 | Discharge: 2022-10-16 | Disposition: A | Discharge status: Home / Self Care | Payer: Medicare Other | Source: Ambulatory Visit | Attending: Vascular Surgery | Admitting: Vascular Surgery

## 2022-10-16 DIAGNOSIS — I70203 Unspecified atherosclerosis of native arteries of extremities, bilateral legs: Secondary | ICD-10-CM | POA: Diagnosis not present

## 2022-10-16 DIAGNOSIS — I70213 Atherosclerosis of native arteries of extremities with intermittent claudication, bilateral legs: Secondary | ICD-10-CM | POA: Diagnosis not present

## 2022-10-16 LAB — VAS US ABI WITH/WO TBI
Left ABI: 0.69
Right ABI: 0.59

## 2022-11-02 ENCOUNTER — Encounter: Payer: Self-pay | Admitting: Surgery

## 2022-11-02 ENCOUNTER — Ambulatory Visit (INDEPENDENT_AMBULATORY_CARE_PROVIDER_SITE_OTHER): Payer: Medicare Other | Admitting: Surgery

## 2022-11-02 VITALS — BP 122/64 | HR 86 | Temp 97.9°F | Resp 20 | Ht 65.0 in | Wt 147.0 lb

## 2022-11-02 DIAGNOSIS — I70213 Atherosclerosis of native arteries of extremities with intermittent claudication, bilateral legs: Secondary | ICD-10-CM

## 2022-11-02 DIAGNOSIS — I6523 Occlusion and stenosis of bilateral carotid arteries: Secondary | ICD-10-CM | POA: Diagnosis not present

## 2022-11-02 NOTE — Progress Notes (Signed)
[facility] [place]  Patient name: Christian Bailey MRN: [id number] DOB: 06/26/1941 Sex: male   REASON FOR VISIT:    Follow up  HISOTRY OF PRESENT ILLNESS:   Christian Bailey is a 82 y.o. male who returns today for follow-up.  He initially underwent right femoral-popliteal bypass graft with vein on 09/18/2016 for claudication.  He has undergone percutaneous as well as surgical revisions of his proximal bypass.  He still is having challenges with claudication.  Ultimately I felt his inflow was the issue and so on 07/06/2018 he underwent an aortobifemoral bypass graft.  Also revised the proximal portion of his right femoropopliteal bypass with an interposition Gore-Tex.  He had a prolonged hospital course, lasting several weeks which ultimately was related to acute cholecystitis.  He had a percutaneous cholecystostomy tube placed.     Last year he was walking approximately 100 feet before he has symptoms.  Now he is walking approximately 75 feet.  He is a little more unstable when he stands up.  He does not have any open wounds.  He continues to take a statin for hypercholesterolemia.  He takes Plavix as his antiplatelet therapy as he is allergic to aspirin.  He is a  former smoker.  PAST MEDICAL HISTORY:   Past Medical History:  Diagnosis Date   Anemia    Complication of anesthesia    Diabetes mellitus ([facility])    TYPE 2   GERD (gastroesophageal reflux disease)    History of blood transfusion    GI bleed   History of colon polyps    History of hiatal hernia    Hyperlipemia    Hypertension    Osteoarthritis    PAD (peripheral artery disease) ([facility])    a. stenting of his left common iliac artery >20 years ago. b. h/o LEIA stent and 2 stents to R SFA in 2011. c. 04/2014:  s/p PTA of right SFA for in-stent restenosis, occluded left SFA   PONV (postoperative nausea and vomiting)    no porblem with the last 3 surgeries   RBBB (right bundle  branch block with left anterior fascicular block)    NUCLEAR STRESS TEST, 08/18/2010 - no significant wall motion abnoramlities noted, post-stress EF 69%, normal myocardial perfusion study   Sinus tachycardia    a. Noted during admission 04/2014 but upon review seems to be frequent finding for patient.   Stenosis of carotid artery    a. 50% right carotid stenosis by angiogram 04/2014.     FAMILY HISTORY:   Family History  Problem Relation Age of Onset   Heart disease Mother    Leukemia Mother    Stomach cancer Father    Esophageal cancer Brother    Liver disease Brother    Alcoholism Brother    Leukemia Brother    Leukemia Brother    Diabetes type II Brother    Lung disease Sister     SOCIAL HISTORY:   Social History   Tobacco Use   Smoking status: Former    Years: 25    Types: Cigarettes    Quit date: 06/1988    Years since quitting: 34.3   Smokeless tobacco: Never  Substance Use Topics   Alcohol use: Yes    Comment: rarely     ALLERGIES:   Allergies  Allergen Reactions   Asa [Aspirin] Other (See Comments)    GI bleeding   Gadolinium Derivatives Itching, Swelling and Other (See Comments)    Pt complained of  face flushing/hottness and throat tightness/scratchiness immediately after the injections.  Within 4 minutes, all symptoms were gone and the study was completed.  No further complications or signs of allergy were exhibited after completion of study.    Oxycodone-Acetaminophen Other (See Comments)    Dizziness and feeling of being uncomfortable      CURRENT MEDICATIONS:   Current Outpatient Medications  Medication Sig Dispense Refill   clopidogrel (PLAVIX) 75 MG tablet TAKE 1 TABLET DAILY 90 tablet 3   lisinopril (ZESTRIL) 5 MG tablet Take 5 mg by mouth 2 (two) times daily.     metFORMIN (GLUCOPHAGE) 1000 MG tablet Take 1,000 mg by mouth 2 (two) times daily.     pantoprazole (PROTONIX) 40 MG tablet Take 1 tablet (40 mg total) by mouth 2 (two) times  daily. 180 tablet 3   rosuvastatin (CRESTOR) 20 MG tablet Take 20 mg by mouth every evening.      TOPROL XL 25 MG 24 hr tablet      No current facility-administered medications for this visit.    REVIEW OF SYSTEMS:   [X]  denotes positive finding, [ ]  denotes negative finding Cardiac  Comments:  Chest pain or chest pressure:    Shortness of breath upon exertion:    Short of breath when lying flat:    Irregular heart rhythm:        Vascular    Pain in calf, thigh, or hip brought on by ambulation: x   Pain in feet at night that wakes you up from your sleep:     Blood clot in your veins:    Leg swelling:         Pulmonary    Oxygen at home:    Productive cough:     Wheezing:         Neurologic    Sudden weakness in arms or legs:     Sudden numbness in arms or legs:     Sudden onset of difficulty speaking or slurred speech:    Temporary loss of vision in one eye:     Problems with dizziness:         Gastrointestinal    Blood in stool:     Vomited blood:         Genitourinary    Burning when urinating:     Blood in urine:        Psychiatric    Major depression:         Hematologic    Bleeding problems:    Problems with blood clotting too easily:        Skin    Rashes or ulcers:        Constitutional    Fever or chills:      PHYSICAL EXAM:   There were no vitals filed for this visit.  GENERAL: The patient is a well-nourished male, in no acute distress. The vital signs are documented above. CARDIAC: There is a regular rate and rhythm.  VASCULAR: Palpable femoral pulses bilaterally.  Nonpalpable pedal pulses PULMONARY: Non-labored respirations ABDOMEN: Soft and non-tender.  Midline incision is well-healed without hernia MUSCULOSKELETAL: There are no major deformities or cyanosis. NEUROLOGIC: No focal weakness or paresthesias are detected. SKIN: There are no ulcers or rashes noted. PSYCHIATRIC: The patient has a normal affect.  STUDIES:   I have reviewed  the following ultrasound studies: Carotid: Right Carotid: Velocities in the right ICA are consistent with a 60-79%  stenosis.   Left Carotid: Velocities in the left ICA are consistent with a 1-39%  stenosis.               Non-hemodynamically significant plaque <50% noted in the  CCA.   Vertebrals: Bilateral vertebral arteries demonstrate antegrade flow.  Subclavians: Normal flow hemodynamics were seen in bilateral subclavian               arteries.  ABI/TBIToday's ABIToday's TBIPrevious ABIPrevious TBI  +-------+-----------+-----------+------------+------------+  Right 0.59       0.38       0.57        0.22          +-------+-----------+-----------+------------+------------+  Left  0.69       0.46       0.53        0.35          +-------+-----------+-----------+------------+------------+  Right toe pressure: 61 Left toe pressure: 74  MEDICAL ISSUES:   Lower extremity atherosclerotic vascular disease: The patient's ABIs remain stable today.  He has had a slight drop-off in his claudication distance from 100 to 75 feet.  Overall, however he is doing well.  I will have him follow-up in 1 year with repeat ABIs and right leg duplex  Carotid: This is being followed by Dr. Allyson Sabal.  He has 60-79% stenosis on the right and remains asymptomatic    Durene Cal, IV, MD, FACS [facility] [phone] Pager [phone]

## 2022-11-13 ENCOUNTER — Other Ambulatory Visit: Payer: Self-pay

## 2022-11-13 DIAGNOSIS — I70213 Atherosclerosis of native arteries of extremities with intermittent claudication, bilateral legs: Secondary | ICD-10-CM

## 2022-11-30 DIAGNOSIS — M2041 Other hammer toe(s) (acquired), right foot: Secondary | ICD-10-CM | POA: Diagnosis not present

## 2022-11-30 DIAGNOSIS — E1142 Type 2 diabetes mellitus with diabetic polyneuropathy: Secondary | ICD-10-CM | POA: Diagnosis not present

## 2022-11-30 DIAGNOSIS — L603 Nail dystrophy: Secondary | ICD-10-CM | POA: Diagnosis not present

## 2022-11-30 DIAGNOSIS — B351 Tinea unguium: Secondary | ICD-10-CM | POA: Diagnosis not present

## 2022-11-30 DIAGNOSIS — I739 Peripheral vascular disease, unspecified: Secondary | ICD-10-CM | POA: Diagnosis not present

## 2022-12-04 DIAGNOSIS — E785 Hyperlipidemia, unspecified: Secondary | ICD-10-CM | POA: Diagnosis not present

## 2022-12-04 DIAGNOSIS — K5730 Diverticulosis of large intestine without perforation or abscess without bleeding: Secondary | ICD-10-CM | POA: Diagnosis not present

## 2022-12-04 DIAGNOSIS — I739 Peripheral vascular disease, unspecified: Secondary | ICD-10-CM | POA: Diagnosis not present

## 2022-12-04 DIAGNOSIS — D62 Acute posthemorrhagic anemia: Secondary | ICD-10-CM | POA: Diagnosis not present

## 2022-12-04 DIAGNOSIS — N1832 Chronic kidney disease, stage 3b: Secondary | ICD-10-CM | POA: Diagnosis not present

## 2022-12-04 DIAGNOSIS — E1151 Type 2 diabetes mellitus with diabetic peripheral angiopathy without gangrene: Secondary | ICD-10-CM | POA: Diagnosis not present

## 2022-12-04 DIAGNOSIS — H548 Legal blindness, as defined in USA: Secondary | ICD-10-CM | POA: Diagnosis not present

## 2022-12-04 DIAGNOSIS — I2510 Atherosclerotic heart disease of native coronary artery without angina pectoris: Secondary | ICD-10-CM | POA: Diagnosis not present

## 2022-12-04 DIAGNOSIS — I129 Hypertensive chronic kidney disease with stage 1 through stage 4 chronic kidney disease, or unspecified chronic kidney disease: Secondary | ICD-10-CM | POA: Diagnosis not present

## 2022-12-04 DIAGNOSIS — N3941 Urge incontinence: Secondary | ICD-10-CM | POA: Diagnosis not present

## 2022-12-04 DIAGNOSIS — R2681 Unsteadiness on feet: Secondary | ICD-10-CM | POA: Diagnosis not present

## 2022-12-04 DIAGNOSIS — M4810 Ankylosing hyperostosis [Forestier], site unspecified: Secondary | ICD-10-CM | POA: Diagnosis not present

## 2023-01-12 DIAGNOSIS — N189 Chronic kidney disease, unspecified: Secondary | ICD-10-CM | POA: Diagnosis not present

## 2023-01-12 DIAGNOSIS — N1832 Chronic kidney disease, stage 3b: Secondary | ICD-10-CM | POA: Diagnosis not present

## 2023-01-19 DIAGNOSIS — I129 Hypertensive chronic kidney disease with stage 1 through stage 4 chronic kidney disease, or unspecified chronic kidney disease: Secondary | ICD-10-CM | POA: Diagnosis not present

## 2023-01-19 DIAGNOSIS — E1122 Type 2 diabetes mellitus with diabetic chronic kidney disease: Secondary | ICD-10-CM | POA: Diagnosis not present

## 2023-01-19 DIAGNOSIS — E875 Hyperkalemia: Secondary | ICD-10-CM | POA: Diagnosis not present

## 2023-01-19 DIAGNOSIS — D631 Anemia in chronic kidney disease: Secondary | ICD-10-CM | POA: Diagnosis not present

## 2023-01-19 DIAGNOSIS — Q6102 Congenital multiple renal cysts: Secondary | ICD-10-CM | POA: Diagnosis not present

## 2023-01-19 DIAGNOSIS — N1832 Chronic kidney disease, stage 3b: Secondary | ICD-10-CM | POA: Diagnosis not present

## 2023-03-08 DIAGNOSIS — B351 Tinea unguium: Secondary | ICD-10-CM | POA: Diagnosis not present

## 2023-03-08 DIAGNOSIS — L603 Nail dystrophy: Secondary | ICD-10-CM | POA: Diagnosis not present

## 2023-03-08 DIAGNOSIS — M2041 Other hammer toe(s) (acquired), right foot: Secondary | ICD-10-CM | POA: Diagnosis not present

## 2023-03-08 DIAGNOSIS — I739 Peripheral vascular disease, unspecified: Secondary | ICD-10-CM | POA: Diagnosis not present

## 2023-03-08 DIAGNOSIS — E1142 Type 2 diabetes mellitus with diabetic polyneuropathy: Secondary | ICD-10-CM | POA: Diagnosis not present

## 2023-04-14 DIAGNOSIS — I2510 Atherosclerotic heart disease of native coronary artery without angina pectoris: Secondary | ICD-10-CM | POA: Diagnosis not present

## 2023-04-14 DIAGNOSIS — R1084 Generalized abdominal pain: Secondary | ICD-10-CM | POA: Diagnosis not present

## 2023-04-14 DIAGNOSIS — R2681 Unsteadiness on feet: Secondary | ICD-10-CM | POA: Diagnosis not present

## 2023-04-14 DIAGNOSIS — H3530 Unspecified macular degeneration: Secondary | ICD-10-CM | POA: Diagnosis not present

## 2023-04-14 DIAGNOSIS — E785 Hyperlipidemia, unspecified: Secondary | ICD-10-CM | POA: Diagnosis not present

## 2023-04-14 DIAGNOSIS — E1151 Type 2 diabetes mellitus with diabetic peripheral angiopathy without gangrene: Secondary | ICD-10-CM | POA: Diagnosis not present

## 2023-04-14 DIAGNOSIS — M4810 Ankylosing hyperostosis [Forestier], site unspecified: Secondary | ICD-10-CM | POA: Diagnosis not present

## 2023-04-14 DIAGNOSIS — D62 Acute posthemorrhagic anemia: Secondary | ICD-10-CM | POA: Diagnosis not present

## 2023-04-14 DIAGNOSIS — I739 Peripheral vascular disease, unspecified: Secondary | ICD-10-CM | POA: Diagnosis not present

## 2023-04-14 DIAGNOSIS — N1832 Chronic kidney disease, stage 3b: Secondary | ICD-10-CM | POA: Diagnosis not present

## 2023-04-14 DIAGNOSIS — I129 Hypertensive chronic kidney disease with stage 1 through stage 4 chronic kidney disease, or unspecified chronic kidney disease: Secondary | ICD-10-CM | POA: Diagnosis not present

## 2023-04-14 DIAGNOSIS — K5730 Diverticulosis of large intestine without perforation or abscess without bleeding: Secondary | ICD-10-CM | POA: Diagnosis not present

## 2023-05-04 DIAGNOSIS — E1151 Type 2 diabetes mellitus with diabetic peripheral angiopathy without gangrene: Secondary | ICD-10-CM | POA: Diagnosis not present

## 2023-05-04 DIAGNOSIS — H548 Legal blindness, as defined in USA: Secondary | ICD-10-CM | POA: Diagnosis not present

## 2023-05-04 DIAGNOSIS — I739 Peripheral vascular disease, unspecified: Secondary | ICD-10-CM | POA: Diagnosis not present

## 2023-05-04 DIAGNOSIS — I129 Hypertensive chronic kidney disease with stage 1 through stage 4 chronic kidney disease, or unspecified chronic kidney disease: Secondary | ICD-10-CM | POA: Diagnosis not present

## 2023-05-04 DIAGNOSIS — R2681 Unsteadiness on feet: Secondary | ICD-10-CM | POA: Diagnosis not present

## 2023-05-04 DIAGNOSIS — R296 Repeated falls: Secondary | ICD-10-CM | POA: Diagnosis not present

## 2023-05-04 DIAGNOSIS — M4810 Ankylosing hyperostosis [Forestier], site unspecified: Secondary | ICD-10-CM | POA: Diagnosis not present

## 2023-05-04 DIAGNOSIS — H3530 Unspecified macular degeneration: Secondary | ICD-10-CM | POA: Diagnosis not present

## 2023-05-04 DIAGNOSIS — N3941 Urge incontinence: Secondary | ICD-10-CM | POA: Diagnosis not present

## 2023-05-06 ENCOUNTER — Telehealth: Payer: Self-pay | Admitting: *Deleted

## 2023-05-06 DIAGNOSIS — I70213 Atherosclerosis of native arteries of extremities with intermittent claudication, bilateral legs: Secondary | ICD-10-CM

## 2023-05-06 NOTE — Telephone Encounter (Signed)
Pt called complaint of falling and increase weakness bilateral LE.  Right leg worse than the left. Pt is doing home OT but this is not helping and feels worse. Scheduled ABI and PA appointment for 05-17-23.  Pt encouraged to call back if symptoms worsen .

## 2023-05-07 ENCOUNTER — Inpatient Hospital Stay (HOSPITAL_BASED_OUTPATIENT_CLINIC_OR_DEPARTMENT_OTHER)
Admission: EM | Admit: 2023-05-07 | Discharge: 2023-05-11 | DRG: 684 | Disposition: A | Discharge status: Skilled Nursing Facility | Payer: Medicare Other | Attending: Internal Medicine | Admitting: Internal Medicine

## 2023-05-07 ENCOUNTER — Emergency Department (HOSPITAL_BASED_OUTPATIENT_CLINIC_OR_DEPARTMENT_OTHER): Payer: Medicare Other | Admitting: Radiology

## 2023-05-07 ENCOUNTER — Emergency Department (HOSPITAL_BASED_OUTPATIENT_CLINIC_OR_DEPARTMENT_OTHER): Payer: Medicare Other

## 2023-05-07 ENCOUNTER — Other Ambulatory Visit: Payer: Self-pay

## 2023-05-07 ENCOUNTER — Encounter (HOSPITAL_BASED_OUTPATIENT_CLINIC_OR_DEPARTMENT_OTHER): Payer: Self-pay

## 2023-05-07 DIAGNOSIS — R000 Tachycardia, unspecified: Secondary | ICD-10-CM | POA: Diagnosis not present

## 2023-05-07 DIAGNOSIS — Z9181 History of falling: Secondary | ICD-10-CM

## 2023-05-07 DIAGNOSIS — N1832 Chronic kidney disease, stage 3b: Secondary | ICD-10-CM

## 2023-05-07 DIAGNOSIS — Z91041 Radiographic dye allergy status: Secondary | ICD-10-CM

## 2023-05-07 DIAGNOSIS — Z885 Allergy status to narcotic agent status: Secondary | ICD-10-CM

## 2023-05-07 DIAGNOSIS — Z87891 Personal history of nicotine dependence: Secondary | ICD-10-CM | POA: Diagnosis not present

## 2023-05-07 DIAGNOSIS — R262 Difficulty in walking, not elsewhere classified: Secondary | ICD-10-CM | POA: Diagnosis not present

## 2023-05-07 DIAGNOSIS — R911 Solitary pulmonary nodule: Secondary | ICD-10-CM | POA: Diagnosis not present

## 2023-05-07 DIAGNOSIS — E785 Hyperlipidemia, unspecified: Secondary | ICD-10-CM | POA: Diagnosis present

## 2023-05-07 DIAGNOSIS — R1312 Dysphagia, oropharyngeal phase: Secondary | ICD-10-CM | POA: Diagnosis not present

## 2023-05-07 DIAGNOSIS — I779 Disorder of arteries and arterioles, unspecified: Secondary | ICD-10-CM | POA: Diagnosis not present

## 2023-05-07 DIAGNOSIS — Z7984 Long term (current) use of oral hypoglycemic drugs: Secondary | ICD-10-CM

## 2023-05-07 DIAGNOSIS — M5137 Other intervertebral disc degeneration, lumbosacral region with discogenic back pain only: Secondary | ICD-10-CM | POA: Diagnosis not present

## 2023-05-07 DIAGNOSIS — I10 Essential (primary) hypertension: Secondary | ICD-10-CM | POA: Diagnosis not present

## 2023-05-07 DIAGNOSIS — E1140 Type 2 diabetes mellitus with diabetic neuropathy, unspecified: Secondary | ICD-10-CM | POA: Diagnosis present

## 2023-05-07 DIAGNOSIS — I739 Peripheral vascular disease, unspecified: Secondary | ICD-10-CM | POA: Diagnosis not present

## 2023-05-07 DIAGNOSIS — H548 Legal blindness, as defined in USA: Secondary | ICD-10-CM | POA: Diagnosis present

## 2023-05-07 DIAGNOSIS — E119 Type 2 diabetes mellitus without complications: Secondary | ICD-10-CM

## 2023-05-07 DIAGNOSIS — Z471 Aftercare following joint replacement surgery: Secondary | ICD-10-CM | POA: Diagnosis not present

## 2023-05-07 DIAGNOSIS — I129 Hypertensive chronic kidney disease with stage 1 through stage 4 chronic kidney disease, or unspecified chronic kidney disease: Secondary | ICD-10-CM | POA: Diagnosis present

## 2023-05-07 DIAGNOSIS — E860 Dehydration: Secondary | ICD-10-CM | POA: Diagnosis present

## 2023-05-07 DIAGNOSIS — Z888 Allergy status to other drugs, medicaments and biological substances status: Secondary | ICD-10-CM

## 2023-05-07 DIAGNOSIS — D72829 Elevated white blood cell count, unspecified: Secondary | ICD-10-CM | POA: Diagnosis not present

## 2023-05-07 DIAGNOSIS — M25561 Pain in right knee: Secondary | ICD-10-CM | POA: Diagnosis not present

## 2023-05-07 DIAGNOSIS — Z886 Allergy status to analgesic agent status: Secondary | ICD-10-CM | POA: Diagnosis not present

## 2023-05-07 DIAGNOSIS — H3530 Unspecified macular degeneration: Secondary | ICD-10-CM | POA: Diagnosis present

## 2023-05-07 DIAGNOSIS — I7090 Unspecified atherosclerosis: Secondary | ICD-10-CM | POA: Diagnosis not present

## 2023-05-07 DIAGNOSIS — K219 Gastro-esophageal reflux disease without esophagitis: Secondary | ICD-10-CM | POA: Diagnosis present

## 2023-05-07 DIAGNOSIS — Z7902 Long term (current) use of antithrombotics/antiplatelets: Secondary | ICD-10-CM | POA: Diagnosis not present

## 2023-05-07 DIAGNOSIS — Z8249 Family history of ischemic heart disease and other diseases of the circulatory system: Secondary | ICD-10-CM | POA: Diagnosis not present

## 2023-05-07 DIAGNOSIS — M48061 Spinal stenosis, lumbar region without neurogenic claudication: Secondary | ICD-10-CM | POA: Diagnosis not present

## 2023-05-07 DIAGNOSIS — Z96611 Presence of right artificial shoulder joint: Secondary | ICD-10-CM | POA: Diagnosis not present

## 2023-05-07 DIAGNOSIS — E1151 Type 2 diabetes mellitus with diabetic peripheral angiopathy without gangrene: Secondary | ICD-10-CM | POA: Diagnosis present

## 2023-05-07 DIAGNOSIS — E1122 Type 2 diabetes mellitus with diabetic chronic kidney disease: Secondary | ICD-10-CM | POA: Diagnosis present

## 2023-05-07 DIAGNOSIS — R278 Other lack of coordination: Secondary | ICD-10-CM | POA: Diagnosis not present

## 2023-05-07 DIAGNOSIS — R531 Weakness: Secondary | ICD-10-CM | POA: Diagnosis not present

## 2023-05-07 DIAGNOSIS — N179 Acute kidney failure, unspecified: Secondary | ICD-10-CM | POA: Diagnosis not present

## 2023-05-07 DIAGNOSIS — R918 Other nonspecific abnormal finding of lung field: Secondary | ICD-10-CM | POA: Diagnosis not present

## 2023-05-07 DIAGNOSIS — M79604 Pain in right leg: Secondary | ICD-10-CM | POA: Diagnosis not present

## 2023-05-07 DIAGNOSIS — N189 Chronic kidney disease, unspecified: Secondary | ICD-10-CM

## 2023-05-07 DIAGNOSIS — Z833 Family history of diabetes mellitus: Secondary | ICD-10-CM | POA: Diagnosis not present

## 2023-05-07 DIAGNOSIS — G8929 Other chronic pain: Secondary | ICD-10-CM | POA: Diagnosis present

## 2023-05-07 DIAGNOSIS — Z79899 Other long term (current) drug therapy: Secondary | ICD-10-CM

## 2023-05-07 DIAGNOSIS — I6521 Occlusion and stenosis of right carotid artery: Secondary | ICD-10-CM | POA: Diagnosis not present

## 2023-05-07 DIAGNOSIS — M25551 Pain in right hip: Secondary | ICD-10-CM | POA: Diagnosis not present

## 2023-05-07 DIAGNOSIS — N281 Cyst of kidney, acquired: Secondary | ICD-10-CM | POA: Diagnosis not present

## 2023-05-07 DIAGNOSIS — Z7401 Bed confinement status: Secondary | ICD-10-CM | POA: Diagnosis not present

## 2023-05-07 DIAGNOSIS — R0602 Shortness of breath: Secondary | ICD-10-CM | POA: Diagnosis not present

## 2023-05-07 DIAGNOSIS — Z887 Allergy status to serum and vaccine status: Secondary | ICD-10-CM

## 2023-05-07 DIAGNOSIS — R0902 Hypoxemia: Secondary | ICD-10-CM | POA: Diagnosis not present

## 2023-05-07 DIAGNOSIS — R0989 Other specified symptoms and signs involving the circulatory and respiratory systems: Secondary | ICD-10-CM | POA: Diagnosis not present

## 2023-05-07 LAB — COMPREHENSIVE METABOLIC PANEL
ALT: 7 U/L (ref 0–44)
AST: 13 U/L — ABNORMAL LOW (ref 15–41)
Albumin: 4.4 g/dL (ref 3.5–5.0)
Alkaline Phosphatase: 76 U/L (ref 38–126)
Anion gap: 10 (ref 5–15)
BUN: 46 mg/dL — ABNORMAL HIGH (ref 8–23)
CO2: 26 mmol/L (ref 22–32)
Calcium: 9.6 mg/dL (ref 8.9–10.3)
Chloride: 98 mmol/L (ref 98–111)
Creatinine, Ser: 2.67 mg/dL — ABNORMAL HIGH (ref 0.61–1.24)
GFR, Estimated: 23 mL/min — ABNORMAL LOW (ref >60)
Glucose, Bld: 189 mg/dL — ABNORMAL HIGH (ref 70–99)
Potassium: 4.3 mmol/L (ref 3.5–5.1)
Sodium: 134 mmol/L — ABNORMAL LOW (ref 135–145)
Total Bilirubin: 0.7 mg/dL (ref <1.2)
Total Protein: 8 g/dL (ref 6.5–8.1)

## 2023-05-07 LAB — URINALYSIS, ROUTINE W REFLEX MICROSCOPIC
Bacteria, UA: NONE SEEN
Bilirubin Urine: NEGATIVE
Glucose, UA: NEGATIVE mg/dL
Ketones, ur: NEGATIVE mg/dL
Leukocytes,Ua: NEGATIVE
Nitrite: NEGATIVE
Specific Gravity, Urine: 1.012 (ref 1.005–1.030)
pH: 5.5 (ref 5.0–8.0)

## 2023-05-07 LAB — CBC WITH DIFFERENTIAL/PLATELET
Abs Immature Granulocytes: 0.08 10*3/uL — ABNORMAL HIGH (ref 0.00–0.07)
Basophils Absolute: 0.1 10*3/uL (ref 0.0–0.1)
Basophils Relative: 0 %
Eosinophils Absolute: 0 10*3/uL (ref 0.0–0.5)
Eosinophils Relative: 0 %
HCT: 31.8 % — ABNORMAL LOW (ref 39.0–52.0)
Hemoglobin: 10.6 g/dL — ABNORMAL LOW (ref 13.0–17.0)
Immature Granulocytes: 0 %
Lymphocytes Relative: 6 %
Lymphs Abs: 1.1 10*3/uL (ref 0.7–4.0)
MCH: 30.5 pg (ref 26.0–34.0)
MCHC: 33.3 g/dL (ref 30.0–36.0)
MCV: 91.4 fL (ref 80.0–100.0)
Monocytes Absolute: 1.3 10*3/uL — ABNORMAL HIGH (ref 0.1–1.0)
Monocytes Relative: 7 %
Neutro Abs: 16.2 10*3/uL — ABNORMAL HIGH (ref 1.7–7.7)
Neutrophils Relative %: 87 %
Platelets: 328 10*3/uL (ref 150–400)
RBC: 3.48 MIL/uL — ABNORMAL LOW (ref 4.22–5.81)
RDW: 13.2 % (ref 11.5–15.5)
WBC: 18.7 10*3/uL — ABNORMAL HIGH (ref 4.0–10.5)
nRBC: 0 % (ref 0.0–0.2)

## 2023-05-07 LAB — CK: Total CK: 122 U/L (ref 49–397)

## 2023-05-07 LAB — MAGNESIUM: Magnesium: 1.6 mg/dL — ABNORMAL LOW (ref 1.7–2.4)

## 2023-05-07 LAB — TROPONIN I (HIGH SENSITIVITY): Troponin I (High Sensitivity): 20 ng/L — ABNORMAL HIGH (ref <18)

## 2023-05-07 LAB — LACTIC ACID, PLASMA: Lactic Acid, Venous: 1.4 mmol/L (ref 0.5–1.9)

## 2023-05-07 MED ORDER — CLOPIDOGREL BISULFATE 75 MG PO TABS
75.0000 mg | ORAL_TABLET | Freq: Every day | ORAL | Status: DC
Start: 2023-05-08 — End: 2023-05-11
  Administered 2023-05-08 – 2023-05-11 (×4): 75 mg via ORAL
  Filled 2023-05-07 (×4): qty 1

## 2023-05-07 MED ORDER — METOPROLOL SUCCINATE ER 25 MG PO TB24
25.0000 mg | ORAL_TABLET | Freq: Two times a day (BID) | ORAL | Status: DC
  Administered 2023-05-07 – 2023-05-08 (×2): 25 mg via ORAL
  Filled 2023-05-07 (×2): qty 1

## 2023-05-07 MED ORDER — MAGNESIUM SULFATE IN D5W 1-5 GM/100ML-% IV SOLN
1.0000 g | Freq: Once | INTRAVENOUS | Status: AC
  Administered 2023-05-07: 1 g via INTRAVENOUS
  Filled 2023-05-07: qty 100

## 2023-05-07 MED ORDER — MIRABEGRON ER 50 MG PO TB24
50.0000 mg | ORAL_TABLET | Freq: Every day | ORAL | Status: DC
  Administered 2023-05-08 – 2023-05-11 (×4): 50 mg via ORAL
  Filled 2023-05-07 (×4): qty 1

## 2023-05-07 MED ORDER — ACETAMINOPHEN 325 MG PO TABS: 650.0000 mg | ORAL_TABLET | Freq: Four times a day (QID) | ORAL | Status: DC | PRN

## 2023-05-07 MED ORDER — PANTOPRAZOLE SODIUM 40 MG PO TBEC
40.0000 mg | DELAYED_RELEASE_TABLET | Freq: Two times a day (BID) | ORAL | Status: DC
  Administered 2023-05-07 – 2023-05-11 (×8): 40 mg via ORAL
  Filled 2023-05-07 (×8): qty 1

## 2023-05-07 MED ORDER — ACETAMINOPHEN 650 MG RE SUPP: 650.0000 mg | Freq: Four times a day (QID) | RECTAL | Status: DC | PRN

## 2023-05-07 MED ORDER — SODIUM CHLORIDE 0.9% FLUSH
3.0000 mL | Freq: Two times a day (BID) | INTRAVENOUS | Status: DC
  Administered 2023-05-07 – 2023-05-11 (×7): 3 mL via INTRAVENOUS

## 2023-05-07 MED ORDER — SODIUM CHLORIDE 0.9 % IV BOLUS
500.0000 mL | Freq: Once | INTRAVENOUS | Status: AC
  Administered 2023-05-07: 500 mL via INTRAVENOUS

## 2023-05-07 MED ORDER — SODIUM CHLORIDE 0.9 % IV BOLUS
500.0000 mL | Freq: Once | INTRAVENOUS | Status: AC
Start: 2023-05-07 — End: 2023-05-07
  Administered 2023-05-07: 500 mL via INTRAVENOUS

## 2023-05-07 MED ORDER — HEPARIN SODIUM (PORCINE) 5000 UNIT/ML IJ SOLN
5000.0000 [IU] | Freq: Three times a day (TID) | INTRAMUSCULAR | Status: DC
  Administered 2023-05-07 – 2023-05-11 (×11): 5000 [IU] via SUBCUTANEOUS
  Filled 2023-05-07 (×11): qty 1

## 2023-05-07 MED ORDER — ACETAMINOPHEN 500 MG PO TABS
1000.0000 mg | ORAL_TABLET | Freq: Once | ORAL | Status: AC
  Administered 2023-05-07: 1000 mg via ORAL
  Filled 2023-05-07: qty 2

## 2023-05-07 MED ORDER — METOPROLOL TARTRATE 5 MG/5ML IV SOLN
5.0000 mg | Freq: Once | INTRAVENOUS | Status: AC
  Administered 2023-05-07: 5 mg via INTRAVENOUS
  Filled 2023-05-07: qty 5

## 2023-05-07 MED ORDER — ROSUVASTATIN CALCIUM 20 MG PO TABS
20.0000 mg | ORAL_TABLET | Freq: Every evening | ORAL | Status: DC
  Administered 2023-05-07 – 2023-05-10 (×4): 20 mg via ORAL
  Filled 2023-05-07 (×4): qty 1

## 2023-05-07 MED ORDER — LACTATED RINGERS IV SOLN: INTRAVENOUS | Status: DC

## 2023-05-07 MED ORDER — POLYETHYLENE GLYCOL 3350 17 G PO PACK: 17.0000 g | PACK | Freq: Every day | ORAL | Status: DC | PRN

## 2023-05-07 MED ORDER — SODIUM CHLORIDE 0.9 % IV SOLN: INTRAVENOUS | Status: DC

## 2023-05-07 NOTE — ED Provider Notes (Signed)
Emergency Department Provider Note   I have reviewed the triage vital signs and the nursing notes.   HISTORY  Chief Complaint Fall (multiple) and Leg Pain (R worse than L)   HPI Christian Bailey is a 82 y.o. male with past history of referral artery disease, hypertension, hyperlipidemia, diabetes presents to the emergency department with lateral leg pain and weakness.  Symptoms been progressively worsening over the past 3 weeks.  He feels like both legs are "giving out" at times.  He has pain in the thighs, tracking medially, worse on the right.  He does have prior history of peripheral artery disease status post femoropopliteal bypass in 2018.  He denies any falls or obvious injury.  He is having some mild low back pain.  No chest pain or shortness of breath.  No near syncope. No bowel/bladder symptoms. Has been working with outpatient PT but is not getting relief with this so far.    Past Medical History:  Diagnosis Date   Anemia    Complication of anesthesia    Diabetes mellitus (HCC)    TYPE 2   GERD (gastroesophageal reflux disease)    History of blood transfusion    GI bleed   History of colon polyps    History of hiatal hernia    Hyperlipemia    Hypertension    Osteoarthritis    PAD (peripheral artery disease) (HCC)    a. stenting of his left common iliac artery >20 years ago. b. h/o LEIA stent and 2 stents to R SFA in 2011. c. 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 Noted during admission 04/2014 but upon review seems to be frequent finding for patient.   Stenosis of carotid artery    a. 50% right carotid stenosis by  angiogram 04/2014.    Review of Systems  Constitutional: No fever/chills Eyes: No visual changes. ENT: No sore throat. Cardiovascular: Denies chest pain. Respiratory: Denies shortness of breath. Gastrointestinal: No abdominal pain.  No nausea, no vomiting.  Genitourinary: Negative for dysuria. Musculoskeletal: Mild low back pain and bilateral leg weakness/pain. Skin: Negative for rash. Neurological: Negative for headaches, focal weakness. Baseline diabetic neuropathy sensation in both legs.    ____________________________________________   PHYSICAL EXAM:  VITAL SIGNS: ED Triage Vitals  Encounter Vitals Group     BP 05/07/23 0942 90/65     Pulse Rate 05/07/23 0942 (!) 121     Resp 05/07/23 0942 16     Temp 05/07/23 0942 98.6 F (37 C)     Temp src --      SpO2 05/07/23 0942 90 %   Constitutional: Alert and oriented. Well appearing and in no acute distress. Eyes: Conjunctivae are normal.  Head: Atraumatic. Nose: No congestion/rhinnorhea. Mouth/Throat: Mucous membranes are moist.   Neck: No stridor.  Cardiovascular: Normal rate, regular rhythm. Good peripheral circulation bilaterally with palpable 1+ DP pulses bilaterally. Grossly normal heart sounds.   Respiratory: Normal respiratory effort.  No retractions. Lungs CTAB. Gastrointestinal: Soft and nontender. No distention.  Musculoskeletal: No lower extremity tenderness nor edema. No gross deformities of extremities. Normal ROM of both hips and kness.  Neurologic:  Normal speech and language. Decreased sensation in both feet consistent with known neuropathy.  Skin:  Skin is warm, dry and intact. No rash noted.   ____________________________________________   LABS (all labs ordered are listed, but only abnormal results are displayed)  Labs Reviewed  COMPREHENSIVE METABOLIC PANEL - Abnormal; Notable for the following components:      Result Value   Sodium 134 (*)    Glucose, Bld 189 (*)    BUN 46 (*)     Creatinine, Ser 2.67 (*)    AST 13 (*)    GFR, Estimated 23 (*)    All other components within normal limits  CBC WITH DIFFERENTIAL/PLATELET - Abnormal; Notable for the following components:   WBC 18.7 (*)    RBC 3.48 (*)    Hemoglobin 10.6 (*)    HCT 31.8 (*)    Neutro Abs 16.2 (*)    Monocytes Absolute 1.3 (*)    Abs Immature Granulocytes 0.08 (*)    All other components within normal limits  URINALYSIS, ROUTINE W REFLEX MICROSCOPIC - Abnormal; Notable for the following components:   Hgb urine dipstick SMALL (*)    Protein, ur TRACE (*)    All other components within normal limits  MAGNESIUM - Abnormal; Notable for the following components:   Magnesium 1.6 (*)    All other components within normal limits  TROPONIN I (HIGH SENSITIVITY) - Abnormal; Notable for the following components:   Troponin I (High Sensitivity) 20 (*)    All other components within normal limits  CK  LACTIC ACID, PLASMA   ____________________________________________  EKG   EKG Interpretation Date/Time:  Friday May 07 2023 12:37:17 EST Ventricular Rate:  125 PR Interval:  138 QRS Duration:  141 QT Interval:  341 QTC Calculation: 492 R Axis:   233  Text Interpretation: Sinus tachycardia Paired ventricular premature complexes Right bundle branch block Confirmed by Alona Bene [phone]) on 05/07/2023 1:01:15 PM        ____________________________________________  RADIOLOGY  DG Knee Complete 4 Views Right  Result Date: 05/07/2023 CLINICAL DATA:  82 year old male with right hip and knee pain. No known injury. EXAM: RIGHT KNEE - COMPLETE 4+ VIEW COMPARISON:  Right hip series today. FINDINGS: Femoral artery vascular stents continue to the distal 3rd femoral shaft. Surgical clips along the medial leg. Bone mineralization is within normal limits for age. Maintained alignment at the right knee. No joint effusion on the cross-table lateral view. Normal joint spaces for age. Patella intact. No acute  osseous abnormality identified. IMPRESSION: 1. No acute osseous abnormality or age advanced degeneration at the right knee. 2. Extensive right femoral artery vascular stents. Electronically Signed   By: Odessa Fleming M.D.   On: 05/07/2023 12:54   DG Hip Unilat W or Wo Pelvis 2-3 Views Right  Result Date: 05/07/2023 CLINICAL DATA:  82 year old male with right hip and knee pain. No known injury. EXAM: DG HIP (WITH OR WITHOUT PELVIS) 2-3V RIGHT COMPARISON:  CT Abdomen and Pelvis 01/18/2019. FINDINGS: Chronic iliac artery vascular stents. Bone mineralization is within normal limits for age. Pelvis appears intact. Femoral heads are normally located. Chronic bilateral acetabular degenerative spurring. Grossly intact proximal left femur. Proximal right femur appears intact. Extensive right femoral artery vascular stents. Multiple right leg surgical clips. No acute osseous abnormality identified. Negative visible bowel gas. IMPRESSION: 1. No acute osseous abnormality identified about the pelvis or right hip. 2. Chronic iliac and right femoral artery vascular stents. Electronically Signed  By: Odessa Fleming M.D.   On: 05/07/2023 12:53   US Venous Img Lower Right (DVT Study)  Result Date: 05/07/2023 CLINICAL DATA:  leg pain. History. The with reported lower extremity bypass EXAM: RIGHT LOWER EXTREMITY VENOUS DOPPLER ULTRASOUND TECHNIQUE: Gray-scale sonography with compression, as well as color and duplex ultrasound, were performed to evaluate the deep venous system(s) from the level of the common femoral vein through the popliteal and proximal calf veins. COMPARISON:  RIGHT lower extremity XRs, 05/07/2023 FINDINGS: VENOUS Normal compressibility of the common femoral, superficial femoral, and popliteal veins, as well as the visualized calf veins. Visualized portions of profunda femoral vein and great saphenous vein unremarkable. No filling defects to suggest DVT on grayscale or color Doppler imaging. Doppler waveforms show normal  direction of venous flow, normal respiratory plasticity and response to augmentation. Limited views of the contralateral common femoral vein are unremarkable. OTHER No evidence of superficial thrombophlebitis or abnormal fluid collection. Limitations: none IMPRESSION: No evidence of femoropopliteal DVT or superficial thrombophlebitis within the RIGHT lower extremity. Roanna Banning, MD [facility] Radiology Electronically Signed   By: Roanna Banning M.D.   On: 05/07/2023 12:02    ____________________________________________   PROCEDURES  Procedure(s) performed:   Procedures  CRITICAL CARE Performed by: Maia Plan Total critical care time: 35 minutes Critical care time was exclusive of separately billable procedures and treating other patients. Critical care was necessary to treat or prevent imminent or life-threatening deterioration. Critical care was time spent personally by me on the following activities: development of treatment plan with patient and/or surrogate as well as nursing, discussions with consultants, evaluation of patient's response to treatment, examination of patient, obtaining history from patient or surrogate, ordering and performing treatments and interventions, ordering and review of laboratory studies, ordering and review of radiographic studies, pulse oximetry and re-evaluation of patient's condition.  Alona Bene, MD Emergency Medicine  ____________________________________________   INITIAL IMPRESSION / ASSESSMENT AND PLAN / ED COURSE  Pertinent labs & imaging results that were available during my care of the patient were reviewed by me and considered in my medical decision making (see chart for details).   This patient is Presenting for Evaluation of falls/leg pain, which does require a range of treatment options, and is a complaint that involves a high risk of morbidity and mortality.  The Differential Diagnoses  include fracture, dislocation, ligamentous strain, labral injury, claudication, cauda equina, etc.  Critical Interventions-    Medications  lactated ringers infusion ( Intravenous New Bag/Given 05/07/23 1423)  sodium chloride 0.9 % bolus 500 mL (0 mLs Intravenous Stopped 05/07/23 1331)  metoprolol tartrate (LOPRESSOR) injection 5 mg (5 mg Intravenous Given 05/07/23 1357)  magnesium sulfate IVPB 1 g 100 mL (0 g Intravenous Stopped 05/07/23 1414)  acetaminophen (TYLENOL) tablet 1,000 mg (1,000 mg Oral Given 05/07/23 1423)    Reassessment after intervention: HR slightly improved with IVF and metoprolol.    I did obtain Additional Historical Information from family at bedside.   Clinical Laboratory Tests Ordered, included CBC with leukocyte with 18.7.  Creatinine of 2.67, elevated from baseline reviewed in Care Everywhere at the [place].  CK and lactic acid normal.  Mild troponin elevation to 20, likely demand ischemia in the setting of tachycardia. Mg low to 1.6. Plan to replace.   Radiologic Tests Ordered, included hip/knee XR. I independently interpreted the images and agree with radiology interpretation.   Cardiac Monitor Tracing which shows NSR.    Social Determinants of  Health Risk patient is a non-smoker.   Consult complete with [facility]. Plan for admit.   Medical Decision Making: Summary:  Patient presents emergency department for evaluation of sensation of legs giving out with some pain, slightly worse on the right.  Patient has known peripheral arterial disease but no obvious critical limb ischemia findings on my exam.  Overall strength and sensation appear reasonable but will need further workup given his age.   Reevaluation with update and discussion with patient and wife. Plan for admit for IVF, Mg replacement, and MSK pain mgmt. No clear indication for emergent spine imaging (MRI). Plan for treatment of tachycardia, Mg replacement, and AKI mgmt as possibly contributing to weakness before  considering further/advanced imaging.   Patient's presentation is most consistent with acute presentation with potential threat to life or bodily function.   Disposition: admit  ____________________________________________  FINAL CLINICAL IMPRESSION(S) / ED DIAGNOSES  Final diagnoses:  Generalized weakness  Tachycardia  Chronic kidney disease, unspecified CKD stage     Note:  This document was prepared using Dragon voice recognition software and may include unintentional dictation errors.  Alona Bene, MD, [facility] Emergency Medicine    Tramaine Sauls, Arlyss Repress, MD 05/07/23 [phone]

## 2023-05-07 NOTE — ED Notes (Signed)
Report given to the next RN.[name] KitchenMarland Kitchen

## 2023-05-07 NOTE — ED Notes (Signed)
Provider aware of the HR... Provider was okay unless it elevated over 130 consistently.

## 2023-05-07 NOTE — ED Notes (Signed)
RT Note: Patient placed on 2lpm due to a decrease in oxygen saturation to 88% on room air

## 2023-05-07 NOTE — ED Notes (Signed)
Called [facility] for transport 

## 2023-05-07 NOTE — ED Notes (Signed)
Spoke to RN accepting Pt care at [facility]. Advised she was unable to get report right now and she would call me back. She was given my name and phone number.

## 2023-05-07 NOTE — ED Notes (Signed)
Pt aware of the need for a urine... Unable to currently collect the sample.[name] KitchenMarland Kitchen

## 2023-05-07 NOTE — H&P (Signed)
History and Physical   Christian Bailey JYN:[id number] DOB: 10-04-1940 DOA: 05/07/2023  PCP: Chilton Greathouse, MD   Patient coming from: Home  Chief Complaint: Leg pain, falls  HPI: Christian Bailey is a 82 y.o. male with medical history significant of hypertension, hyperlipidemia, diabetes, CKD 3, carotid artery disease, PAD, macular degeneration.  Patient has had chronic leg pain for some time but has had worsening of this for several weeks.  He also is known to have history of falls.  Reports that it feels like his legs will give out at times.  Does report pain at his bilateral thighs radiating medially worse on the right compared to the left.  Denies any bowel or bladder incontinence.  States he has been working with outpatient PT without significant improvement.  Denies fevers, chills, chest pain, shortness breath, abdominal pain, constipation, diarrhea, nausea, vomiting.  ED Course: Vital signs in the ED notable for blood pressure in the 120s to 140s systolic, heart rate in the 110s to 130s.  Lab workup included CMP with sodium 134, BUN 46, creatinine elevated to 2.67 from baseline 1.9, glucose 189.  CBC with leukocytosis 18.7, hemoglobin stable at 10.6.  CK normal.  Lactic acid normal.  Troponin mildly evaded at 20.  Magnesium 1.6.  Urinalysis with hemoglobin, protein only.  Right hip x-ray showed chronic iliac and femoral stenting, right knee x-ray showed no acute normality.  Right lower extremity DVT study was negative.  Chest x-ray showed no acute abnormality.  Patient received metoprolol IV x 1, 500 cc IV fluids and started on a rate of IV fluids.  1 g IV magnesium.  Review of Systems: As per HPI otherwise all other systems reviewed and are negative.  Past Medical History:  Diagnosis Date   Anemia    Complication of anesthesia    Diabetes mellitus (HCC)    TYPE 2   GERD (gastroesophageal reflux disease)    History of blood transfusion    GI bleed   History of colon polyps    History  of hiatal hernia    Hyperlipemia    Hypertension    Osteoarthritis    PAD (peripheral artery disease) (HCC)    a. stenting of his left common iliac artery >20 years ago. b. h/o LEIA stent and 2 stents to R SFA in 2011. c. 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 Noted during admission 04/2014 but upon review seems to be frequent finding for patient.   Stenosis of carotid artery    a. 50% right carotid stenosis by angiogram 04/2014.    Past Surgical History:  Procedure Laterality Date   ABDOMINAL AORTOGRAM W/LOWER EXTREMITY N/A 01/27/2017   Procedure: Abdominal Aortogram w/Lower Extremity;  Surgeon: Nada Libman, MD;  Location: [facility];  Service: Cardiovascular;  Laterality: N/A;   ABDOMINAL AORTOGRAM W/LOWER EXTREMITY N/A 06/08/2017   Procedure: ABDOMINAL AORTOGRAM W/LOWER EXTREMITY;  Surgeon: Nada Libman, MD;  Location: [facility];  Service: Cardiovascular;  Laterality: N/A;   ABDOMINAL AORTOGRAM W/LOWER EXTREMITY N/A 08/31/2017   Procedure: ABDOMINAL AORTOGRAM W/LOWER EXTREMITY;  Surgeon: Nada Libman, MD;  Location:  [facility];  Service: Cardiovascular;  Laterality: N/A;  rt. unilateral   ANGIOPLASTY / STENTING FEMORAL     ANGIOPLASTY / STENTING ILIAC     AORTA - BILATERAL FEMORAL ARTERY BYPASS GRAFT N/A 07/06/2018   Procedure: AORTA BIFEMORAL BYPASS GRAFT USING 14X7MM X 40CM HEMASHIELD GOLD GRAFT;  Surgeon: Nada Libman, MD;  Location: [facility] OR;  Service: Vascular;  Laterality: N/A;   CEREBRAL ANGIOGRAM N/A 05/14/2014   Procedure: CEREBRAL ANGIOGRAM;  Surgeon: Runell Gess, MD;  Location: [facility] CATH LAB;   Service: Cardiovascular;  Laterality: N/A;   COLONOSCOPY W/ POLYPECTOMY     ENDARTERECTOMY Right 09/08/2017   ENDARTERECTOMY FEMORAL Right 09/08/2017   Procedure: REDO RIGHT FEMORAL ENDARTECTOMY WITH PATCH ANGIOPLASTY.;  Surgeon: Nada Libman, MD;  Location: [facility] OR;  Service: Vascular;  Laterality: Right;   EYE SURGERY Bilateral    cataract   FEMORAL ARTERY STENT Right 05/12/2010   Stented distally with a 6x100 Abbott absolute stent and proximally with a 6x60 Cook Zilver stent resulting in the reduction of the proximal segment 80% and mid segment 60-70% to 0% residual, LEFT common femoral artery stented with a 7x3 Smart stent resulting in reduction of 90% stenosis to 0% residual   FEMORAL-POPLITEAL BYPASS GRAFT Right 09/18/2016   Procedure: BYPASS GRAFT FEMORAL-POPLITEAL ARTERY;  Surgeon: Nada Libman, MD;  Location: [facility];  Service: Vascular;  Laterality: Right;   FEMORAL-POPLITEAL BYPASS GRAFT Right 09/08/2017   Procedure: REDO BYPASS GRAFT FEMORAL-POPLITEAL ARTERY;  Surgeon: Nada Libman, MD;  Location: [facility];  Service: Vascular;  Laterality: Right;   FEMORAL-POPLITEAL BYPASS GRAFT Right 07/06/2018   Procedure: REVISION RIGHT FEMORAL TO POPLITEAL ARTERY BYPASS GRAFT;  Surgeon: Nada Libman, MD;  Location: [facility];  Service: Vascular;  Laterality: Right;   IR CHOLANGIOGRAM EXISTING TUBE  08/04/2018   IR EXCHANGE BILIARY DRAIN  01/22/2019   IR PERC CHOLECYSTOSTOMY  07/19/2018   IR RADIOLOGIST EVAL & MGMT  08/31/2018   IR RADIOLOGIST EVAL & MGMT  01/17/2019   IR RADIOLOGIST EVAL & MGMT  02/01/2019   IR RADIOLOGIST EVAL & MGMT  02/08/2019   KNEE ARTHROSCOPY     left   LOWER EXTREMITY ANGIOGRAM N/A 05/14/2014   Procedure: LOWER EXTREMITY ANGIOGRAM;  Surgeon: Runell Gess, MD;  Location: [facility] CATH LAB;  Service: Cardiovascular;  Laterality: N/A;   LOWER EXTREMITY ANGIOGRAPHY N/A 09/21/2016   Procedure: Lower Extremity Angiography;  Surgeon: Fransisco Hertz, MD;  Location: [facility] INVASIVE CV LAB;   Service: Cardiovascular;  Laterality: N/A;   PERIPHERAL VASCULAR ATHERECTOMY Right 01/27/2017   Procedure: PERIPHERAL VASCULAR ATHERECTOMY;  Surgeon: Nada Libman, MD;  [facility]: [facility];  Service: Cardiovascular;  Laterality: Right;   PERIPHERAL VASCULAR BALLOON ANGIOPLASTY Right 06/08/2017   Procedure: PERIPHERAL VASCULAR BALLOON ANGIOPLASTY;  Surgeon: Nada Libman, MD;  Location: [facility];  Service: Cardiovascular;  Laterality: Right;  common femoral and superficial femoral arteries   PERIPHERAL VASCULAR CATHETERIZATION N/A 04/20/2016   Procedure: Lower Extremity Intervention;  Surgeon: Runell Gess, MD;  Location: [facility] INVASIVE CV LAB;  Service: Cardiovascular;  Laterality: N/A;   PERIPHERAL VASCULAR INTERVENTION  08/31/2017   Procedure: PERIPHERAL VASCULAR INTERVENTION;  Surgeon: Nada Libman, MD;  Location: [facility];  Service: Cardiovascular;;  REIA   REVERSE SHOULDER ARTHROPLASTY Right 12/07/2016   REVERSE SHOULDER ARTHROPLASTY Right 12/07/2016   Procedure: REVERSE SHOULDER ARTHROPLASTY;  Surgeon: Beverely Low, MD;  [facility];  Service:  Orthopedics;  Laterality: Right;   ROTATOR CUFF REPAIR Right 2003   SFA Right 05/14/2014   PTA  OF RT SFA         DR BERRY    Social History  reports that he quit smoking about 34 years ago. He started smoking about 59 years ago. He has never used smokeless tobacco. He reports current alcohol use. He reports that he does not use drugs.  Allergies  Allergen Reactions   Asa [Aspirin] Other (See Comments)    GI bleeding   Gadolinium Derivatives Itching, Swelling and Other (See Comments)    Pt complained of face flushing/hottness and throat tightness/scratchiness immediately after the injections.  Within 4 minutes, all symptoms were gone and the study was completed.  No further complications or signs of allergy were exhibited after completion of study.    Iodinated Contrast Media     Other Reaction(s): Not  available   Pneumococcal 13-Val Conj Vacc     Other Reaction(s): achiness all over, dizziness, nausea, weakness   Scopolamine     Other Reaction(s): Delerium   Oxycodone-Acetaminophen Other (See Comments)    Dizziness and feeling of being uncomfortable     Family History  Problem Relation Age of Onset   Heart disease Mother    Leukemia Mother    Stomach cancer Father    Esophageal cancer Brother    Liver disease Brother    Alcoholism Brother    Leukemia Brother    Leukemia Brother    Diabetes type II Brother    Lung disease Sister   Reviewed on admission  Prior to Admission medications   Medication Sig Start Date End Date Taking? Authorizing Provider  clopidogrel (PLAVIX) 75 MG tablet TAKE 1 TABLET DAILY 07/07/22  Yes Runell Gess, MD  ferrous sulfate 325 (65 FE) MG tablet Take 325 mg by mouth daily.   Yes [provider]  lisinopril (ZESTRIL) 5 MG tablet Take 5 mg by mouth 2 (two) times daily.   Yes [provider]  metFORMIN (GLUCOPHAGE) 1000 MG tablet Take 500 mg by mouth 2 (two) times daily. 12/02/19  Yes [provider]  MYRBETRIQ 50 MG TB24 tablet Take 50 mg by mouth daily. 10/12/22  Yes [provider]  pantoprazole (PROTONIX) 40 MG tablet Take 1 tablet (40 mg total) by mouth 2 (two) times daily. 09/10/22  Yes Runell Gess, MD  rosuvastatin (CRESTOR) 20 MG tablet Take 20 mg by mouth every evening.    Yes [provider]  TOPROL XL 25 MG 24 hr tablet Take 25 mg by mouth in the morning and at bedtime. 02/04/19  Yes [provider]  UNABLE TO FIND Take 1 tablet by mouth daily as needed (solidify bowel movements). Med Name: [place]   Yes [provider]    Physical Exam: Vitals:   05/07/23 1346 05/07/23 1500 05/07/23 1610 05/07/23 1620  BP:  130/68    Pulse:  (!) 121    Resp:  17    Temp:      TempSrc:      SpO2: 93% 93%    Weight:    71.7 kg  Height:   5\' 4"  (1.626 m)     Physical  Exam Constitutional:      General: He is not in acute distress.    Appearance: Normal appearance.  HENT:     Head: Normocephalic and atraumatic.     Mouth/Throat:     Mouth: Mucous membranes are moist.  Pharynx: Oropharynx is clear.  Eyes:     Extraocular Movements: Extraocular movements intact.     Pupils: Pupils are equal, round, and reactive to light.  Cardiovascular:     Rate and Rhythm: Regular rhythm. Tachycardia present.     Pulses: Normal pulses.     Heart sounds: Normal heart sounds.  Pulmonary:     Effort: Pulmonary effort is normal. No respiratory distress.     Breath sounds: Normal breath sounds.  Abdominal:     General: Bowel sounds are normal. There is no distension.     Palpations: Abdomen is soft.     Tenderness: There is no abdominal tenderness.  Musculoskeletal:        General: No swelling or deformity.  Skin:    General: Skin is warm and dry.  Neurological:     General: No focal deficit present.     Mental Status: Mental status is at baseline.    Labs on Admission: I have personally reviewed following labs and imaging studies  CBC: Recent Labs  Lab 05/07/23 1058  WBC 18.7*  NEUTROABS 16.2*  HGB 10.6*  HCT 31.8*  MCV 91.4  PLT 328    Basic Metabolic Panel: Recent Labs  Lab 05/07/23 1058  NA 134*  K 4.3  CL 98  CO2 26  GLUCOSE 189*  BUN 46*  CREATININE 2.67*  CALCIUM 9.6  MG 1.6*    GFR: Estimated Creatinine Clearance: 19.7 mL/min (A) (by C-G formula based on SCr of 2.67 mg/dL (H)).  Liver Function Tests: Recent Labs  Lab 05/07/23 1058  AST 13*  ALT 7  ALKPHOS 76  BILITOT 0.7  PROT 8.0  ALBUMIN 4.4    Urine analysis:    Component Value Date/Time   COLORURINE YELLOW 05/07/2023 1111   APPEARANCEUR CLEAR 05/07/2023 1111   LABSPEC 1.012 05/07/2023 1111   PHURINE 5.5 05/07/2023 1111   GLUCOSEU NEGATIVE 05/07/2023 1111   HGBUR SMALL (A) 05/07/2023 1111   BILIRUBINUR NEGATIVE 05/07/2023 1111   KETONESUR NEGATIVE  05/07/2023 1111   PROTEINUR TRACE (A) 05/07/2023 1111   NITRITE NEGATIVE 05/07/2023 1111   LEUKOCYTESUR NEGATIVE 05/07/2023 1111    Radiological Exams on Admission: DG Chest Portable 1 View  Result Date: 05/07/2023 CLINICAL DATA:  Weakness, multiple falls. EXAM: PORTABLE CHEST 1 VIEW COMPARISON:  January 18, 2019. FINDINGS: The heart size and mediastinal contours are within normal limits. Both lungs are clear. Status post right shoulder arthroplasty. IMPRESSION: No active disease. Electronically Signed   By: Lupita Raider M.D.   On: 05/07/2023 16:40   DG Knee Complete 4 Views Right  Result Date: 05/07/2023 CLINICAL DATA:  82 year old male with right hip and knee pain. No known injury. EXAM: RIGHT KNEE - COMPLETE 4+ VIEW COMPARISON:  Right hip series today. FINDINGS: Femoral artery vascular stents continue to the distal 3rd femoral shaft. Surgical clips along the medial leg. Bone mineralization is within normal limits for age. Maintained alignment at the right knee. No joint effusion on the cross-table lateral view. Normal joint spaces for age. Patella intact. No acute osseous abnormality identified. IMPRESSION: 1. No acute osseous abnormality or age advanced degeneration at the right knee. 2. Extensive right femoral artery vascular stents. Electronically Signed   By: Odessa Fleming M.D.   On: 05/07/2023 12:54   DG Hip Unilat W or Wo Pelvis 2-3 Views Right  Result Date: 05/07/2023 CLINICAL DATA:  82 year old male with right hip and knee pain. No known injury. EXAM: DG HIP (WITH OR  WITHOUT PELVIS) 2-3V RIGHT COMPARISON:  CT Abdomen and Pelvis 01/18/2019. FINDINGS: Chronic iliac artery vascular stents. Bone mineralization is within normal limits for age. Pelvis appears intact. Femoral heads are normally located. Chronic bilateral acetabular degenerative spurring. Grossly intact proximal left femur. Proximal right femur appears intact. Extensive right femoral artery vascular stents. Multiple right leg surgical  clips. No acute osseous abnormality identified. Negative visible bowel gas. IMPRESSION: 1. No acute osseous abnormality identified about the pelvis or right hip. 2. Chronic iliac and right femoral artery vascular stents. Electronically Signed   By: Odessa Fleming M.D.   On: 05/07/2023 12:53   US Venous Img Lower Right (DVT Study)  Result Date: 05/07/2023 CLINICAL DATA:  leg pain. History. The with reported lower extremity bypass EXAM: RIGHT LOWER EXTREMITY VENOUS DOPPLER ULTRASOUND TECHNIQUE: Gray-scale sonography with compression, as well as color and duplex ultrasound, were performed to evaluate the deep venous system(s) from the level of the common femoral vein through the popliteal and proximal calf veins. COMPARISON:  RIGHT lower extremity XRs, 05/07/2023 FINDINGS: VENOUS Normal compressibility of the common femoral, superficial femoral, and popliteal veins, as well as the visualized calf veins. Visualized portions of profunda femoral vein and great saphenous vein unremarkable. No filling defects to suggest DVT on grayscale or color Doppler imaging. Doppler waveforms show normal direction of venous flow, normal respiratory plasticity and response to augmentation. Limited views of the contralateral common femoral vein are unremarkable. OTHER No evidence of superficial thrombophlebitis or abnormal fluid collection. Limitations: none IMPRESSION: No evidence of femoropopliteal DVT or superficial thrombophlebitis within the RIGHT lower extremity. Roanna Banning, MD [facility] Electronically Signed   By: Roanna Banning M.D.   On: 05/07/2023 12:02    EKG: Independently reviewed.  Sinus tachycardia at 125 bpm.  PVC noted.  Right bundle branch block with QRS 141.  QTc borderline at 492.  Morphology similar to previous.  Assessment/Plan Principal Problem:   Acute renal failure superimposed on stage 3b chronic kidney disease, unspecified acute renal failure type  (HCC) Active Problems:   Sinus tachycardia   Leukocytosis   AKI on CKD 3B Hypomagnesemia > Creatinine elevated to 2.67 from baseline 1.9.  BUN 46.  Sodium 134.  Magnesium 1.6. > Has received 500 cc bolus and gentle IV fluids in ED.  Also received 1 g IV magnesium. > Unclear etiology, (presumed dehydration) - Monitoring on telemetry overnight - Continue with IV fluids overnight and will give additional 500 cc bolus as well - Trend renal function and electrolytes - Holding lisinopril  Tachycardia > Likely in setting of dehydration/AKI as above.  Heart rate has improved from the 130s to the 100s with IV fluids. > Please also received 1 dose of IV metoprolol in the ED.  Is prescribed metoprolol twice daily outpatient. > Chart review also shows that heart rate baseline may be in the 100-1 10 range based on vitals from recent outpatient visits - Continue with IV fluids as above - Continue with metoprolol XL twice daily  PAD Leg pain > Known history of PAD status post multiple interventions including right femoropopliteal bypass and subsequent aortobifem bypass. > Persistent leg pain of unclear etiology.  Nothing obvious on imaging in the ED.  No bowel or bladder symptoms. > Will get repeat ABI as he was due for this anyway see if there is any vascular explanation for this.  If Patient is found, could consider additional imaging with MRI as he has not had 1 since  2018 and that 1 did show congenital narrowing of spinal canal with additional moderate to severe canal and subarticular stenosis.   - Bilateral ABIs - Supportive care    Hypertension - Holding lisinopril as above - Continue metoprolol  Hyperlipidemia - Continue home rosuvastatin  Diabetes - SSI  Carotid artery disease > Known asymptomatic right carotid stenosis at 60-79%. - Continue home Plavix, rosuvastatin  DVT prophylaxis: Heparin Code Status:   Full Family Communication:  None on admission  Disposition Plan:    Patient is from:  Home  Anticipated DC to:  Home  Anticipated DC date:  1 to 2 days  Anticipated DC barriers: None  Consults called:  None Admission status:  Observation, telemetry  Severity of Illness: The appropriate patient status for this patient is OBSERVATION. Observation status is judged to be reasonable and necessary in order to provide the required intensity of service to ensure the patient's safety. The patient's presenting symptoms, physical exam findings, and initial radiographic and laboratory data in the context of their medical condition is felt to place them at decreased risk for further clinical deterioration. Furthermore, it is anticipated that the patient will be medically stable for discharge from the hospital within 2 midnights of admission.    Synetta Fail MD [facility]  How to contact the [facility] Attending or Consulting provider 7A - 7P or covering provider during after hours 7P -7A, for this patient?   Check the care team in [facility] and look for a) attending/consulting [facility] provider listed and b) the [facility] team listed Log into [email or web address] and use [facility] [place] universal password to access. If you do not have the password, please contact the hospital operator. Locate the [facility] provider you are looking for under [facility] and page to a number that you can be directly reached. If you still have difficulty reaching the provider, please page the [facility] (Director on Call) for the Hospitalists listed on amion for assistance.  05/07/2023, 6:11 PM

## 2023-05-07 NOTE — ED Triage Notes (Signed)
Pt c/o multiple falls recently, RLE pain. Followed by OT at home, states he's here because "it takes 4-6 efforts to get out of a chair & I'm afraid of falling."  BLE pain, 2-3 in L, "but it's worse on R."

## 2023-05-08 ENCOUNTER — Inpatient Hospital Stay (HOSPITAL_COMMUNITY): Payer: Medicare Other

## 2023-05-08 DIAGNOSIS — Z887 Allergy status to serum and vaccine status: Secondary | ICD-10-CM | POA: Diagnosis not present

## 2023-05-08 DIAGNOSIS — Z9181 History of falling: Secondary | ICD-10-CM | POA: Diagnosis not present

## 2023-05-08 DIAGNOSIS — R000 Tachycardia, unspecified: Secondary | ICD-10-CM | POA: Diagnosis present

## 2023-05-08 DIAGNOSIS — I739 Peripheral vascular disease, unspecified: Secondary | ICD-10-CM | POA: Diagnosis not present

## 2023-05-08 DIAGNOSIS — H548 Legal blindness, as defined in USA: Secondary | ICD-10-CM | POA: Diagnosis present

## 2023-05-08 DIAGNOSIS — G8929 Other chronic pain: Secondary | ICD-10-CM | POA: Diagnosis present

## 2023-05-08 DIAGNOSIS — Z7902 Long term (current) use of antithrombotics/antiplatelets: Secondary | ICD-10-CM | POA: Diagnosis not present

## 2023-05-08 DIAGNOSIS — K219 Gastro-esophageal reflux disease without esophagitis: Secondary | ICD-10-CM | POA: Diagnosis present

## 2023-05-08 DIAGNOSIS — M79604 Pain in right leg: Secondary | ICD-10-CM | POA: Diagnosis not present

## 2023-05-08 DIAGNOSIS — E860 Dehydration: Secondary | ICD-10-CM | POA: Diagnosis present

## 2023-05-08 DIAGNOSIS — E1151 Type 2 diabetes mellitus with diabetic peripheral angiopathy without gangrene: Secondary | ICD-10-CM | POA: Diagnosis present

## 2023-05-08 DIAGNOSIS — Z7401 Bed confinement status: Secondary | ICD-10-CM | POA: Diagnosis not present

## 2023-05-08 DIAGNOSIS — I129 Hypertensive chronic kidney disease with stage 1 through stage 4 chronic kidney disease, or unspecified chronic kidney disease: Secondary | ICD-10-CM | POA: Diagnosis present

## 2023-05-08 DIAGNOSIS — Z885 Allergy status to narcotic agent status: Secondary | ICD-10-CM | POA: Diagnosis not present

## 2023-05-08 DIAGNOSIS — Z833 Family history of diabetes mellitus: Secondary | ICD-10-CM | POA: Diagnosis not present

## 2023-05-08 DIAGNOSIS — R262 Difficulty in walking, not elsewhere classified: Secondary | ICD-10-CM | POA: Diagnosis not present

## 2023-05-08 DIAGNOSIS — Z79899 Other long term (current) drug therapy: Secondary | ICD-10-CM | POA: Diagnosis not present

## 2023-05-08 DIAGNOSIS — M48061 Spinal stenosis, lumbar region without neurogenic claudication: Secondary | ICD-10-CM | POA: Diagnosis not present

## 2023-05-08 DIAGNOSIS — R0602 Shortness of breath: Secondary | ICD-10-CM | POA: Diagnosis not present

## 2023-05-08 DIAGNOSIS — Z7984 Long term (current) use of oral hypoglycemic drugs: Secondary | ICD-10-CM | POA: Diagnosis not present

## 2023-05-08 DIAGNOSIS — R278 Other lack of coordination: Secondary | ICD-10-CM | POA: Diagnosis not present

## 2023-05-08 DIAGNOSIS — Z8249 Family history of ischemic heart disease and other diseases of the circulatory system: Secondary | ICD-10-CM | POA: Diagnosis not present

## 2023-05-08 DIAGNOSIS — E1140 Type 2 diabetes mellitus with diabetic neuropathy, unspecified: Secondary | ICD-10-CM | POA: Diagnosis present

## 2023-05-08 DIAGNOSIS — E1122 Type 2 diabetes mellitus with diabetic chronic kidney disease: Secondary | ICD-10-CM | POA: Diagnosis present

## 2023-05-08 DIAGNOSIS — R911 Solitary pulmonary nodule: Secondary | ICD-10-CM | POA: Diagnosis not present

## 2023-05-08 DIAGNOSIS — I7090 Unspecified atherosclerosis: Secondary | ICD-10-CM

## 2023-05-08 DIAGNOSIS — R1312 Dysphagia, oropharyngeal phase: Secondary | ICD-10-CM | POA: Diagnosis not present

## 2023-05-08 DIAGNOSIS — E785 Hyperlipidemia, unspecified: Secondary | ICD-10-CM | POA: Diagnosis present

## 2023-05-08 DIAGNOSIS — Z96611 Presence of right artificial shoulder joint: Secondary | ICD-10-CM | POA: Diagnosis present

## 2023-05-08 DIAGNOSIS — R918 Other nonspecific abnormal finding of lung field: Secondary | ICD-10-CM | POA: Diagnosis not present

## 2023-05-08 DIAGNOSIS — N281 Cyst of kidney, acquired: Secondary | ICD-10-CM | POA: Diagnosis not present

## 2023-05-08 DIAGNOSIS — N179 Acute kidney failure, unspecified: Secondary | ICD-10-CM | POA: Diagnosis present

## 2023-05-08 DIAGNOSIS — N1832 Chronic kidney disease, stage 3b: Secondary | ICD-10-CM | POA: Diagnosis present

## 2023-05-08 DIAGNOSIS — Z886 Allergy status to analgesic agent status: Secondary | ICD-10-CM | POA: Diagnosis not present

## 2023-05-08 DIAGNOSIS — I10 Essential (primary) hypertension: Secondary | ICD-10-CM | POA: Diagnosis not present

## 2023-05-08 DIAGNOSIS — H3530 Unspecified macular degeneration: Secondary | ICD-10-CM | POA: Diagnosis present

## 2023-05-08 DIAGNOSIS — I6521 Occlusion and stenosis of right carotid artery: Secondary | ICD-10-CM | POA: Diagnosis not present

## 2023-05-08 DIAGNOSIS — R0902 Hypoxemia: Secondary | ICD-10-CM | POA: Diagnosis not present

## 2023-05-08 DIAGNOSIS — Z87891 Personal history of nicotine dependence: Secondary | ICD-10-CM | POA: Diagnosis not present

## 2023-05-08 DIAGNOSIS — I779 Disorder of arteries and arterioles, unspecified: Secondary | ICD-10-CM | POA: Diagnosis not present

## 2023-05-08 DIAGNOSIS — R0989 Other specified symptoms and signs involving the circulatory and respiratory systems: Secondary | ICD-10-CM | POA: Diagnosis not present

## 2023-05-08 DIAGNOSIS — M5137 Other intervertebral disc degeneration, lumbosacral region with discogenic back pain only: Secondary | ICD-10-CM | POA: Diagnosis not present

## 2023-05-08 LAB — COMPREHENSIVE METABOLIC PANEL
ALT: 13 U/L (ref 0–44)
AST: 15 U/L (ref 15–41)
Albumin: 2.8 g/dL — ABNORMAL LOW (ref 3.5–5.0)
Alkaline Phosphatase: 70 U/L (ref 38–126)
Anion gap: 9 (ref 5–15)
BUN: 33 mg/dL — ABNORMAL HIGH (ref 8–23)
CO2: 24 mmol/L (ref 22–32)
Calcium: 8.7 mg/dL — ABNORMAL LOW (ref 8.9–10.3)
Chloride: 105 mmol/L (ref 98–111)
Creatinine, Ser: 2.2 mg/dL — ABNORMAL HIGH (ref 0.61–1.24)
GFR, Estimated: 29 mL/min — ABNORMAL LOW (ref >60)
Glucose, Bld: 135 mg/dL — ABNORMAL HIGH (ref 70–99)
Potassium: 4.4 mmol/L (ref 3.5–5.1)
Sodium: 138 mmol/L (ref 135–145)
Total Bilirubin: 0.9 mg/dL (ref <1.2)
Total Protein: 6.1 g/dL — ABNORMAL LOW (ref 6.5–8.1)

## 2023-05-08 LAB — MAGNESIUM: Magnesium: 1.9 mg/dL (ref 1.7–2.4)

## 2023-05-08 LAB — CBC
HCT: 28.1 % — ABNORMAL LOW (ref 39.0–52.0)
Hemoglobin: 9.2 g/dL — ABNORMAL LOW (ref 13.0–17.0)
MCH: 30 pg (ref 26.0–34.0)
MCHC: 32.7 g/dL (ref 30.0–36.0)
MCV: 91.5 fL (ref 80.0–100.0)
Platelets: 260 10*3/uL (ref 150–400)
RBC: 3.07 MIL/uL — ABNORMAL LOW (ref 4.22–5.81)
RDW: 13.1 % (ref 11.5–15.5)
WBC: 12.7 10*3/uL — ABNORMAL HIGH (ref 4.0–10.5)
nRBC: 0 % (ref 0.0–0.2)

## 2023-05-08 LAB — T4, FREE: Free T4: 1.25 ng/dL — ABNORMAL HIGH (ref 0.61–1.12)

## 2023-05-08 LAB — TSH: TSH: 1.279 u[IU]/mL (ref 0.350–4.500)

## 2023-05-08 MED ORDER — METOPROLOL SUCCINATE ER 50 MG PO TB24
50.0000 mg | ORAL_TABLET | Freq: Two times a day (BID) | ORAL | Status: DC
  Administered 2023-05-08: 50 mg via ORAL
  Filled 2023-05-08: qty 1

## 2023-05-08 MED ORDER — FUROSEMIDE 10 MG/ML IJ SOLN
20.0000 mg | Freq: Once | INTRAMUSCULAR | Status: AC
  Administered 2023-05-08: 20 mg via INTRAVENOUS
  Filled 2023-05-08: qty 2

## 2023-05-08 MED ORDER — DILTIAZEM HCL 25 MG/5ML IV SOLN
10.0000 mg | Freq: Four times a day (QID) | INTRAVENOUS | Status: DC | PRN
  Administered 2023-05-08 – 2023-05-10 (×2): 10 mg via INTRAVENOUS
  Filled 2023-05-08 (×2): qty 5

## 2023-05-08 MED ORDER — ENSURE ENLIVE PO LIQD
237.0000 mL | Freq: Two times a day (BID) | ORAL | Status: DC
  Administered 2023-05-08 – 2023-05-11 (×7): 237 mL via ORAL

## 2023-05-08 MED ORDER — SODIUM CHLORIDE 0.9 % IV SOLN: INTRAVENOUS | Status: DC

## 2023-05-08 NOTE — Progress Notes (Signed)
VASCULAR LAB    Right lower extremity arterial duplex has been performed.  See CV proc for preliminary results.   Jaion Lagrange, RVT 05/08/2023, 5:37 PM

## 2023-05-08 NOTE — Evaluation (Signed)
Physical Therapy Evaluation Patient Details Name: Christian Bailey MRN: [id number] DOB: 25-Jun-1941 Today's Date: 05/08/2023  History of Present Illness  The pt is an 82 yo male presenting 11/8 with reports of multiple falls at home and RLE pain. Work up revealed AKI on CKD III and tachycardia. No acute DVT. PMH includes: anemia, DM II, HLD, HTN, PAD, and RBBB.   Clinical Impression  Pt in bed upon arrival of PT, agreeable to evaluation at this time. Prior to admission the pt was using RW in the home, needing 4-6 attempts to achieve standing from chair, and with 2 falls in last 2 months. The pt reports he had HHPT and OT which resulted in leg muscle soreness so he declined further therapy. Today, he needed modA to complete bed mobility, needing assist to move his LE and to elevate his trunk. He was able to maintain seated balance without support, but needed min-modA to complete sit-stand due to poor power in BLE, limited strength and ROM in RUE, and increased shakiness/tremors with standing. The pt was limited to 2 x 5 ft ambulation, needing up to maxA to maintain balance with challenge of stepping backwards. The pt would benefit from intensive therapy to regain lost mobility, stability, and independence but he reports he is worried about intensive therapy given past experience. I still feel he would benefit from post-acute therapy <3hours/day prior to return home to decrease burden on the pt's wife as he needs physical assist for OOB mobility and continues to be at great risk for falls. If pt declines, recommend max HH services.    HR to 147bpm with standing, 139bpm with sitting EOB     If plan is discharge home, recommend the following: Two people to help with walking and/or transfers;A lot of help with bathing/dressing/bathroom;Direct supervision/assist for medications management;Direct supervision/assist for financial management;Assist for transportation;Help with stairs or ramp for entrance   Can  travel by private vehicle   Yes    Equipment Recommendations BSC/3in1  Recommendations for Other Services  OT consult    Functional Status Assessment Patient has had a recent decline in their functional status and demonstrates the ability to make significant improvements in function in a reasonable and predictable amount of time.     Precautions / Restrictions Precautions Precautions: Fall Precaution Comments: 2 falls in last 2 months Restrictions Weight Bearing Restrictions: No      Mobility  Bed Mobility Overal bed mobility: Needs Assistance Bed Mobility: Supine to Sit     Supine to sit: Mod assist, HOB elevated, Used rails     General bed mobility comments: modA with use of bed rails and assist at pad, pt not moving LE despite cues. assist to elevate trunk    Transfers Overall transfer level: Needs assistance Equipment used: Rolling walker (2 wheels) Transfers: Sit to/from Stand, Bed to chair/wheelchair/BSC Sit to Stand: Mod assist, Min assist   Step pivot transfers: Min assist       General transfer comment: modA initially to rise from EOB, progressed to minA within session due to poor LE power and limited assist from RUE. pt needing modA on initial stand to balance    Ambulation/Gait Ambulation/Gait assistance: Mod assist Gait Distance (Feet): 5 Feet (x2) Assistive device: Rolling walker (2 wheels) Gait Pattern/deviations: Step-through pattern, Scissoring, Narrow base of support, Trunk flexed Gait velocity: decreased     General Gait Details: pt very shaky with UE tremors and difficulty advancing LE without scissoring/very narrow BOS (stepping on his own feet) cues  for widening BOS, modA to maintain balance, up to maxA with backwards stepping. HR to 147bpm     Balance Overall balance assessment: Needs assistance Sitting-balance support: No upper extremity supported, Feet supported Sitting balance-Leahy Scale: Good     Standing balance support:  Bilateral upper extremity supported, During functional activity Standing balance-Leahy Scale: Poor Standing balance comment: dependent on BUE support and min-modA for static standing. mod-maxA with BUE support for gait                             Pertinent Vitals/Pain Pain Assessment Pain Assessment: Faces Faces Pain Scale: Hurts a little bit Pain Location: RLE (thigh) Pain Descriptors / Indicators: Discomfort, Grimacing, Pins and needles Pain Intervention(s): Limited activity within patient's tolerance, Monitored during session, Repositioned    Home Living Family/patient expects to be discharged to:: Private residence Living Arrangements: Spouse/significant other Available Help at Discharge: Family;Available 24 hours/day Type of Home: House Home Access: Stairs to enter Entrance Stairs-Rails: Left Entrance Stairs-Number of Steps: 3   Home Layout: One level Home Equipment: Agricultural consultant (2 wheels);Rollator (4 wheels);Shower seat - built in      Prior Function Prior Level of Function : Needs assist;History of Falls (last six months)             Mobility Comments: pt using RW in home, rollator in community. sideways on stairs for BUE support on rail. reports sit-stand can take 5-6 tries ADLs Comments: recently has wife help sponge bathe, typically gets in shower. fell putting on underwear while standing     Extremity/Trunk Assessment   Upper Extremity Assessment Upper Extremity Assessment: RUE deficits/detail RUE Deficits / Details: limited after R total shoulder surgery, <90 deg flexion and abduction RUE: Unable to fully assess due to pain;Shoulder pain with ROM    Lower Extremity Assessment Lower Extremity Assessment: Generalized weakness;RLE deficits/detail;LLE deficits/detail RLE Deficits / Details: limited to 3+/5 to MMT at knee and ankle, reports sensation intact in thigh/calf but diminished at foot RLE Sensation: decreased light touch;decreased  proprioception RLE Coordination: decreased fine motor;decreased gross motor LLE Deficits / Details: grossly 4/5 to MMT, limited power and endurance pt reports sensation intact in thigh and calf but decreased in foot LLE Sensation: WNL LLE Coordination: WNL    Cervical / Trunk Assessment Cervical / Trunk Assessment: Kyphotic  Communication   Communication Communication: No apparent difficulties Cueing Techniques: Verbal cues  Cognition Arousal: Alert Behavior During Therapy: WFL for tasks assessed/performed Overall Cognitive Status: Impaired/Different from baseline Area of Impairment: Awareness, Safety/judgement, Problem solving                         Safety/Judgement: Decreased awareness of safety, Decreased awareness of deficits Awareness: Intellectual Problem Solving: Slow processing, Decreased initiation, Difficulty sequencing, Requires verbal cues General Comments: pt with poor insight to deficits and safety concerns. able to follow simple commands well in session for functional mobility, poor insight to need for care        General Comments General comments (skin integrity, edema, etc.): HR elevated to 130s at rest, to 140s with activity        Assessment/Plan    PT Assessment Patient needs continued PT services  PT Problem List Decreased strength;Decreased range of motion;Decreased activity tolerance;Decreased balance;Decreased mobility;Decreased coordination;Decreased cognition;Decreased safety awareness       PT Treatment Interventions DME instruction;Gait training;Stair training;Therapeutic activities;Functional mobility training;Therapeutic exercise;Balance training;Patient/family education    PT  Goals (Current goals can be found in the Care Plan section)  Acute Rehab PT Goals Patient Stated Goal: to return home PT Goal Formulation: With patient Time For Goal Achievement: 05/22/23 Potential to Achieve Goals: Good    Frequency Min 1X/week         AM-PAC PT "6 Clicks" Mobility  Outcome Measure Help needed turning from your back to your side while in a flat bed without using bedrails?: A Lot Help needed moving from lying on your back to sitting on the side of a flat bed without using bedrails?: A Lot Help needed moving to and from a bed to a chair (including a wheelchair)?: A Lot Help needed standing up from a chair using your arms (e.g., wheelchair or bedside chair)?: A Lot Help needed to walk in hospital room?: Total (<20 ft) Help needed climbing 3-5 steps with a railing? : Total 6 Click Score: 10    End of Session Equipment Utilized During Treatment: Gait belt Activity Tolerance: Patient limited by fatigue Patient left: in chair;with call bell/phone within reach;with chair alarm set Nurse Communication: Mobility status PT Visit Diagnosis: Unsteadiness on feet (R26.81);Other abnormalities of gait and mobility (R26.89);Muscle weakness (generalized) (M62.81);Repeated falls (R29.6);Pain;Ataxic gait (R26.0) Pain - Right/Left: Right Pain - part of body: Leg    Time: 1610-9604 PT Time Calculation (min) (ACUTE ONLY): 47 min   Charges:   PT Evaluation $PT Eval Moderate Complexity: 1 Mod PT Treatments $Gait Training: 8-22 mins $Therapeutic Activity: 8-22 mins PT General Charges $$ ACUTE PT VISIT: 1 Visit         Vickki Muff, PT, DPT   Acute Rehabilitation Department Office [phone] Secure Chat Communication Preferred  Ronnie Derby 05/08/2023, 5:09 PM

## 2023-05-08 NOTE — Consult Note (Signed)
Hospital Consult    Reason for Consult: Leg pain Requesting Physician:  Susa Raring MRN #:  [id number]  History of Present Illness: This is a 82 y.o. male who is well-known to the vascular surgery service he underwent right femoral to popliteal bypass with vein in 2018 and subsequently aortobifemoral bypass in January 2020.  He was last seen in the office by Dr. Myra Gianotti about 6 months ago where he was still noted to be having claudication. He presented to the hospital yesterday evening due to worsening right knee and thigh pain.  He reports that his he always has some right foot pins-and-needles pain that may be slightly worse as well.  He reports he does ambulate with a walker but feels that his right leg specifically his knee and thigh have been weaker and he has been having a few falls over the last couple weeks.  Specifically he reports yesterday the pain was so severe in his medial thigh and knee that he could not extend his leg.  His white count was noted to be elevated at 18 although he denies fevers chills or myalgias.  DVT study was negative.  ABI pending.  Past Medical History:  Diagnosis Date   Anemia    Complication of anesthesia    Diabetes mellitus (HCC)    TYPE 2   GERD (gastroesophageal reflux disease)    History of blood transfusion    GI bleed   History of colon polyps    History of hiatal hernia    Hyperlipemia    Hypertension    Osteoarthritis    PAD (peripheral artery disease) (HCC)    a. stenting of his left common iliac artery >20 years ago. b. h/o LEIA stent and 2 stents to R SFA in 2011. c. 04/2014:  s/p PTA of right SFA for in-stent restenosis, occluded left SFA   PONV (postoperative nausea and vomiting)    no porblem with the last 3 surgeries   RBBB (right bundle branch block with left anterior fascicular block)    NUCLEAR STRESS TEST, 08/18/2010 - no significant wall motion abnoramlities noted, post-stress EF 69%, normal myocardial perfusion study   Sinus  tachycardia    a. Noted during admission 04/2014 but upon review seems to be frequent finding for patient.   Stenosis of carotid artery    a. 50% right carotid stenosis by angiogram 04/2014.    Past Surgical History:  Procedure Laterality Date   ABDOMINAL AORTOGRAM W/LOWER EXTREMITY N/A 01/27/2017   Procedure: Abdominal Aortogram w/Lower Extremity;  Surgeon: Nada Libman, MD;  Location: [facility];  Service: Cardiovascular;  Laterality: N/A;   ABDOMINAL AORTOGRAM W/LOWER EXTREMITY N/A 06/08/2017   Procedure: ABDOMINAL AORTOGRAM W/LOWER EXTREMITY;  Surgeon: Nada Libman, MD;  Location: [facility];  Service: Cardiovascular;  Laterality: N/A;   ABDOMINAL AORTOGRAM W/LOWER EXTREMITY N/A 08/31/2017   Procedure: ABDOMINAL AORTOGRAM W/LOWER EXTREMITY;  Surgeon: Nada Libman, MD;  Location: [facility];  Service: Cardiovascular;  Laterality: N/A;  rt. unilateral   ANGIOPLASTY / STENTING FEMORAL     ANGIOPLASTY / STENTING ILIAC     AORTA - BILATERAL FEMORAL ARTERY BYPASS GRAFT N/A 07/06/2018   Procedure: AORTA BIFEMORAL BYPASS GRAFT USING 14X7MM X 40CM HEMASHIELD GOLD GRAFT;  Surgeon: Nada Libman, MD;  Location: [facility] OR;  Service: Vascular;  Laterality: N/A;   CEREBRAL ANGIOGRAM N/A 05/14/2014   Procedure: CEREBRAL ANGIOGRAM;  Surgeon: Runell Gess, MD;  Location: [facility] CATH LAB;  Service: Cardiovascular;  Laterality: N/A;   COLONOSCOPY W/ POLYPECTOMY     ENDARTERECTOMY Right 09/08/2017   ENDARTERECTOMY FEMORAL Right 09/08/2017   Procedure: REDO RIGHT FEMORAL ENDARTECTOMY WITH PATCH ANGIOPLASTY.;  Surgeon: Nada Libman, MD;  Location: [facility] OR;  Service: Vascular;  Laterality: Right;   EYE SURGERY Bilateral    cataract   FEMORAL ARTERY STENT Right 05/12/2010   Stented distally with a 6x100 Abbott absolute stent and proximally with a 6x60 Cook Zilver stent resulting in the reduction of the proximal segment 80% and mid segment 60-70% to 0% residual, LEFT common  femoral artery stented with a 7x3 Smart stent resulting in reduction of 90% stenosis to 0% residual   FEMORAL-POPLITEAL BYPASS GRAFT Right 09/18/2016   Procedure: BYPASS GRAFT FEMORAL-POPLITEAL ARTERY;  Surgeon: Nada Libman, MD;  Location: [facility] OR;  Service: Vascular;  Laterality: Right;   FEMORAL-POPLITEAL BYPASS GRAFT Right 09/08/2017   Procedure: REDO BYPASS GRAFT FEMORAL-POPLITEAL ARTERY;  Surgeon: Nada Libman, MD;  Location: [facility] OR;  Service: Vascular;  Laterality: Right;   FEMORAL-POPLITEAL BYPASS GRAFT Right 07/06/2018   Procedure: REVISION RIGHT FEMORAL TO POPLITEAL ARTERY BYPASS GRAFT;  Surgeon: Nada Libman, MD;  Location: [facility] OR;  Service: Vascular;  Laterality: Right;   IR CHOLANGIOGRAM EXISTING TUBE  08/04/2018   IR EXCHANGE BILIARY DRAIN  01/22/2019   IR PERC CHOLECYSTOSTOMY  07/19/2018   IR RADIOLOGIST EVAL & MGMT  08/31/2018   IR RADIOLOGIST EVAL & MGMT  01/17/2019   IR RADIOLOGIST EVAL & MGMT  02/01/2019   IR RADIOLOGIST EVAL & MGMT  02/08/2019   KNEE ARTHROSCOPY     left   LOWER EXTREMITY ANGIOGRAM N/A 05/14/2014   Procedure: LOWER EXTREMITY ANGIOGRAM;  Surgeon: Runell Gess, MD;  Location: [facility] CATH LAB;  Service: Cardiovascular;  Laterality: N/A;   LOWER EXTREMITY ANGIOGRAPHY N/A 09/21/2016   Procedure: Lower Extremity Angiography;  Surgeon: Fransisco Hertz, MD;  Location: [facility] INVASIVE CV LAB;  Service: Cardiovascular;  Laterality: N/A;   PERIPHERAL VASCULAR ATHERECTOMY Right 01/27/2017   Procedure: PERIPHERAL VASCULAR ATHERECTOMY;  Surgeon: Nada Libman, MD;  Location: [facility];  Service: Cardiovascular;  Laterality: Right;   PERIPHERAL VASCULAR BALLOON ANGIOPLASTY Right 06/08/2017   Procedure: PERIPHERAL VASCULAR BALLOON ANGIOPLASTY;  Surgeon: Nada Libman, MD;  Location: [facility];  Service: Cardiovascular;  Laterality: Right;  common femoral and superficial femoral arteries   PERIPHERAL VASCULAR CATHETERIZATION N/A 04/20/2016   Procedure: Lower  Extremity Intervention;  Surgeon: Runell Gess, MD;  Location: [facility] INVASIVE CV LAB;  Service: Cardiovascular;  Laterality: N/A;   PERIPHERAL VASCULAR INTERVENTION  08/31/2017   Procedure: PERIPHERAL VASCULAR INTERVENTION;  Surgeon: Nada Libman, MD;  Location: [facility];  Service: Cardiovascular;;  REIA   REVERSE SHOULDER ARTHROPLASTY Right 12/07/2016   REVERSE SHOULDER ARTHROPLASTY Right 12/07/2016   Procedure: REVERSE SHOULDER ARTHROPLASTY;  Surgeon: Beverely Low, MD;  Location: [facility] OR;  Service: Orthopedics;  Laterality: Right;   ROTATOR CUFF REPAIR Right 2003   SFA Right 05/14/2014   PTA  OF RT SFA         DR BERRY    Allergies  Allergen Reactions   Asa [Aspirin] Other (See Comments)    GI bleeding   Gadolinium Derivatives Itching, Swelling and Other (See Comments)    Pt complained of face flushing/hottness and throat tightness/scratchiness immediately after the injections.  Within 4 minutes, all symptoms were gone and the study was completed.  No further  complications or signs of allergy were exhibited after completion of study.    Iodinated Contrast Media     Other Reaction(s): Not available   Pneumococcal 13-Val Conj Vacc     Other Reaction(s): achiness all over, dizziness, nausea, weakness   Scopolamine     Other Reaction(s): Delerium   Oxycodone-Acetaminophen Other (See Comments)    Dizziness and feeling of being uncomfortable     Prior to Admission medications   Medication Sig Start Date End Date Taking? Authorizing Provider  clopidogrel (PLAVIX) 75 MG tablet TAKE 1 TABLET DAILY 07/07/22  Yes Runell Gess, MD  ferrous sulfate 325 (65 FE) MG tablet Take 325 mg by mouth daily.   Yes [provider]  lisinopril (ZESTRIL) 5 MG tablet Take 5 mg by mouth 2 (two) times daily.   Yes [provider]  metFORMIN (GLUCOPHAGE) 1000 MG tablet Take 500 mg by mouth 2 (two) times daily. 12/02/19  Yes [provider]  MYRBETRIQ 50 MG TB24 tablet Take  50 mg by mouth daily. 10/12/22  Yes [provider]  pantoprazole (PROTONIX) 40 MG tablet Take 1 tablet (40 mg total) by mouth 2 (two) times daily. 09/10/22  Yes Runell Gess, MD  rosuvastatin (CRESTOR) 20 MG tablet Take 20 mg by mouth every evening.    Yes [provider]  TOPROL XL 25 MG 24 hr tablet Take 25 mg by mouth in the morning and at bedtime. 02/04/19  Yes [provider]  UNABLE TO FIND Take 1 tablet by mouth daily as needed (solidify bowel movements). Med Name: [place]   Yes [provider]    Social History   Socioeconomic History   Marital status: Married    Spouse name: Not on file   Number of children: 2   Years of education: Not on file   Highest education level: Not on file  Occupational History   Occupation: retired    Associate Professor: RETIRED  Tobacco Use   Smoking status: Former    Current packs/day: 0.00    Types: Cigarettes    Start date: 06/1963    Quit date: 06/1988    Years since quitting: 34.8   Smokeless tobacco: Never  Vaping Use   Vaping status: Never Used  Substance and Sexual Activity   Alcohol use: Yes    Comment: rarely   Drug use: No   Sexual activity: Not on file  Other Topics Concern   Not on file  Social History Narrative   Not on file   Social Determinants of Health   Financial Resource Strain: Not on file  Food Insecurity: No Food Insecurity (05/07/2023)   Hunger Vital Sign    Worried About Running Out of Food in the Last Year: Never true    Ran Out of Food in the Last Year: Never true  Transportation Needs: No Transportation Needs (05/07/2023)   PRAPARE - Administrator, Civil Service (Medical): No    Lack of Transportation (Non-Medical): No  Physical Activity: Not on file  Stress: Not on file  Social Connections: Unknown (09/04/2022)   Received from [facility]   Social Network    Social Network: Not on file  Intimate Partner Violence: Not At Risk (05/07/2023)    Humiliation, Afraid, Rape, and Kick questionnaire    Fear of Current or Ex-Partner: No    Emotionally Abused: No    Physically Abused: No    Sexually Abused: No    Family History  Problem Relation Age of Onset   Heart disease Mother    Leukemia Mother    Stomach cancer Father    Esophageal cancer Brother    Liver disease Brother    Alcoholism Brother    Leukemia Brother    Leukemia Brother    Diabetes type II Brother    Lung disease Sister     ROS: Otherwise negative unless mentioned in HPI  Physical Examination  Vitals:   05/08/23 0421 05/08/23 0800  BP: (!) 118/59 124/89  Pulse: (!) 115 (!) 113  Resp: 16 19  Temp: 98.7 F (37.1 C) 98.2 F (36.8 C)  SpO2: 98% 95%   Body mass index is 27.12 kg/m.  General: no acute distress Cardiac: hemodynamically stable, nontachycardic Pulm: normal work of breathing GI: non-tender, no pulsatile mass  Neuro: alert, no focal deficit Extremities: Mild edema bilaterally, no induration or erythema of the right thigh or knee Vascular:   Right: Palpable femoral pulse  Left: Palpable femoral pulse   Data:   DVT study Negative  ABI and right lower extremity graft duplex ordered and pending   ASSESSMENT/PLAN: This is a 82 y.o. male with PAD who is status post right femoropopliteal with vein in 2018 and aortobifemoral bypass in 2020 who presents for worsening right thigh and knee pain.  He has palpable femoral pulses so I do not believe there is any inflow issue. I explained that his symptoms do not necessarily correlate with an arterial or bypass issue as that would present with significant foot pain in that thigh and knee pain are more likely to stem from a musculoskeletal culprit. Recommend infection workup and will assess duplex for any fluid collection although with a vein graft it is less likely.  -Will follow-up ABI and right lower extremity duplex   Daria Pastures MD [facility] [phone] 05/08/2023  10:45 AM

## 2023-05-08 NOTE — Progress Notes (Signed)
PROGRESS NOTE                                                                                                                                                                                                             Patient Demographics:    Christian Bailey, is a 82 y.o. male, DOB - 11/17/1940, OZD:[id number]  Outpatient Primary MD for the patient is Avva, Ravisankar, MD    LOS - 0  Admit date - 05/07/2023    Chief Complaint  Patient presents with   Fall    multiple   Leg Pain    R worse than L       Brief Narrative (HPI from H&P)   82 y.o. male with medical history significant of hypertension, hyperlipidemia, diabetes, CKD 3B, carotid artery disease, PAD, macular degeneration legally blind, patient has ongoing chronic bilateral lower extremity pain from his PAD right more than left which is gradually getting worse, he tried to get an appointment with his vascular surgeon but was not able to get in the office in a timely fashion.  He came to the hospital to get some help for his worsening bilateral lower extremity pain, he was diagnosed with dehydration and AKI and admitted to the hospital.   Subjective:    [name] today has, No headache, No chest pain, No abdominal pain - No Nausea, No new weakness tingling or numbness,  no SOB, acute on chronic bilateral lower extremity pain right more than left   Assessment  & Plan :   AKI on CKD 3B - Due to combination of dehydration and ACE inhibitor use, hold ACE inhibitor, hydrate and monitor, renal function gradually improving.  His baseline creatinine is around 1.5.   Tachycardia - > Likely in setting of dehydration and leg pain, supportive care, continue home dose beta-blocker and monitor.  Heart rate much improved.  Will check TSH.   PAD - Leg pain - > Known history of PAD status post multiple interventions including right femoropopliteal bypass and subsequent aortobifem  bypass.  Venous ultrasound unremarkable, ABIs pending, requested vascular surgery to evaluate.  Continue Plavix and statin for secondary prevention.  Pain control.   Hypertension - Holding lisinopril as above,  Continue metoprolol   Hyperlipidemia - Continue home rosuvastatin    Carotid artery disease > Known asymptomatic right carotid stenosis at  60-79%. - Continue home Plavix, rosuvastatin   Diabetes - SSI  CBG (last 3)  No results for input(s): "GLUCAP" in the last 72 hours.      Condition - Extremely Guarded  Family Communication  : Wife updated on 05/08/2023  Code Status : Full code  Consults  : VVS  PUD Prophylaxis :  PPI   Procedures  :     Right lower extremity venous duplex.  No DVT.      Disposition Plan  :    Status is: Observation  DVT Prophylaxis  :    heparin injection 5,000 Units Start: 05/07/23 2200    Lab Results  Component Value Date   PLT 260 05/08/2023    Diet :  Diet Order             Diet regular Fluid consistency: Thin  Diet effective now                    Inpatient Medications  Scheduled Meds:  clopidogrel  75 mg Oral Daily   feeding supplement  237 mL Oral BID BM   heparin  5,000 Units Subcutaneous Q8H   metoprolol succinate  25 mg Oral BID   mirabegron ER  50 mg Oral Daily   pantoprazole  40 mg Oral BID   rosuvastatin  20 mg Oral QPM   sodium chloride flush  3 mL Intravenous Q12H   Continuous Infusions:  sodium chloride 75 mL/hr at 05/08/23 0552   PRN Meds:.acetaminophen **OR** acetaminophen, polyethylene glycol  Antibiotics  :    Anti-infectives (From admission, onward)    None         Objective:   Vitals:   05/07/23 2022 05/08/23 0146 05/08/23 0421 05/08/23 0800  BP: 137/68 (!) 146/68 (!) 118/59 124/89  Pulse: (!) 109 (!) 122 (!) 115 (!) 113  Resp: 19 16 16 19   Temp: 98.4 F (36.9 C) 98.8 F (37.1 C) 98.7 F (37.1 C) 98.2 F (36.8 C)  TempSrc: Oral Oral Oral Oral  SpO2: 98% 95% 98% 95%   Weight:      Height:        Wt Readings from Last 3 Encounters:  05/07/23 71.7 kg  11/02/22 66.7 kg  07/07/22 66.4 kg     Intake/Output Summary (Last 24 hours) at 05/08/2023 0907 Last data filed at 05/08/2023 0130 Gross per 24 hour  Intake 593.08 ml  Output 1000 ml  Net -406.92 ml     Physical Exam  Awake Alert, No new F.N deficits, Normal affect [place].AT,PERRAL Supple Neck, No JVD,   Symmetrical Chest wall movement, Good air movement bilaterally, CTAB RRR,No Gallops,Rubs or new Murmurs,  +ve B.Sounds, Abd Soft, No tenderness,   No Cyanosis, Clubbing or edema        Data Review:    Recent Labs  Lab 05/07/23 1058 05/08/23 0418  WBC 18.7* 12.7*  HGB 10.6* 9.2*  HCT 31.8* 28.1*  PLT 328 260  MCV 91.4 91.5  MCH 30.5 30.0  MCHC 33.3 32.7  RDW 13.2 13.1  LYMPHSABS 1.1  --   MONOABS 1.3*  --   EOSABS 0.0  --   BASOSABS 0.1  --     Recent Labs  Lab 05/07/23 1058 05/07/23 1231 05/08/23 0418  NA 134*  --  138  K 4.3  --  4.4  CL 98  --  105  CO2 26  --  24  ANIONGAP 10  --  9  GLUCOSE 189*  --  135*  BUN 46*  --  33*  CREATININE 2.67*  --  2.20*  AST 13*  --  15  ALT 7  --  13  ALKPHOS 76  --  70  BILITOT 0.7  --  0.9  ALBUMIN 4.4  --  2.8*  LATICACIDVEN  --  1.4  --   MG 1.6*  --  1.9  CALCIUM 9.6  --  8.7*      Recent Labs  Lab 05/07/23 1058 05/07/23 1231 05/08/23 0418  LATICACIDVEN  --  1.4  --   MG 1.6*  --  1.9  CALCIUM 9.6  --  8.7*    --------------------------------------------------------------------------------------------------------------- Lab Results  Component Value Date   TRIG 87 07/25/2018    Lab Results  Component Value Date   HGBA1C 5.9 (H) 01/20/2019     Micro Results No results found for this or any previous visit (from the past 240 hour(s)).  Radiology Reports DG Chest Portable 1 View  Result Date: 05/07/2023 CLINICAL DATA:  Weakness, multiple falls. EXAM: PORTABLE CHEST 1 VIEW COMPARISON:  January 18, 2019.  FINDINGS: The heart size and mediastinal contours are within normal limits. Both lungs are clear. Status post right shoulder arthroplasty. IMPRESSION: No active disease. Electronically Signed   By: Lupita Raider M.D.   On: 05/07/2023 16:40   DG Knee Complete 4 Views Right  Result Date: 05/07/2023 CLINICAL DATA:  82 year old male with right hip and knee pain. No known injury. EXAM: RIGHT KNEE - COMPLETE 4+ VIEW COMPARISON:  Right hip series today. FINDINGS: Femoral artery vascular stents continue to the distal 3rd femoral shaft. Surgical clips along the medial leg. Bone mineralization is within normal limits for age. Maintained alignment at the right knee. No joint effusion on the cross-table lateral view. Normal joint spaces for age. Patella intact. No acute osseous abnormality identified. IMPRESSION: 1. No acute osseous abnormality or age advanced degeneration at the right knee. 2. Extensive right femoral artery vascular stents. Electronically Signed   By: Odessa Fleming M.D.   On: 05/07/2023 12:54   DG Hip Unilat W or Wo Pelvis 2-3 Views Right  Result Date: 05/07/2023 CLINICAL DATA:  82 year old male with right hip and knee pain. No known injury. EXAM: DG HIP (WITH OR WITHOUT PELVIS) 2-3V RIGHT COMPARISON:  CT Abdomen and Pelvis 01/18/2019. FINDINGS: Chronic iliac artery vascular stents. Bone mineralization is within normal limits for age. Pelvis appears intact. Femoral heads are normally located. Chronic bilateral acetabular degenerative spurring. Grossly intact proximal left femur. Proximal right femur appears intact. Extensive right femoral artery vascular stents. Multiple right leg surgical clips. No acute osseous abnormality identified. Negative visible bowel gas. IMPRESSION: 1. No acute osseous abnormality identified about the pelvis or right hip. 2. Chronic iliac and right femoral artery vascular stents. Electronically Signed   By: Odessa Fleming M.D.   On: 05/07/2023 12:53   US Venous Img Lower Right (DVT  Study)  Result Date: 05/07/2023 CLINICAL DATA:  leg pain. History. The with reported lower extremity bypass EXAM: RIGHT LOWER EXTREMITY VENOUS DOPPLER ULTRASOUND TECHNIQUE: Gray-scale sonography with compression, as well as color and duplex ultrasound, were performed to evaluate the deep venous system(s) from the level of the common femoral vein through the popliteal and proximal calf veins. COMPARISON:  RIGHT lower extremity XRs, 05/07/2023 FINDINGS: VENOUS Normal compressibility of the common femoral, superficial femoral, and popliteal veins, as well as the visualized calf veins. Visualized portions of profunda femoral vein and great saphenous vein unremarkable.  No filling defects to suggest DVT on grayscale or color Doppler imaging. Doppler waveforms show normal direction of venous flow, normal respiratory plasticity and response to augmentation. Limited views of the contralateral common femoral vein are unremarkable. OTHER No evidence of superficial thrombophlebitis or abnormal fluid collection. Limitations: none IMPRESSION: No evidence of femoropopliteal DVT or superficial thrombophlebitis within the RIGHT lower extremity. Roanna Banning, MD [facility] Radiology Electronically Signed   By: Roanna Banning M.D.   On: 05/07/2023 12:02      Signature  -   Susa Raring M.D on 05/08/2023 at 9:07 AM   -  To page go to [email or web address]

## 2023-05-08 NOTE — Progress Notes (Signed)
VASCULAR LAB    ABI has been performed.  See CV proc for preliminary results.   Kanijah Groseclose, RVT 05/08/2023, 5:37 PM

## 2023-05-09 ENCOUNTER — Inpatient Hospital Stay (HOSPITAL_COMMUNITY): Payer: Medicare Other

## 2023-05-09 DIAGNOSIS — N179 Acute kidney failure, unspecified: Secondary | ICD-10-CM | POA: Diagnosis not present

## 2023-05-09 DIAGNOSIS — N1832 Chronic kidney disease, stage 3b: Secondary | ICD-10-CM | POA: Diagnosis not present

## 2023-05-09 LAB — CBC WITH DIFFERENTIAL/PLATELET
Abs Immature Granulocytes: 0.05 10*3/uL (ref 0.00–0.07)
Basophils Absolute: 0 10*3/uL (ref 0.0–0.1)
Basophils Relative: 0 %
Eosinophils Absolute: 0.1 10*3/uL (ref 0.0–0.5)
Eosinophils Relative: 1 %
HCT: 28.6 % — ABNORMAL LOW (ref 39.0–52.0)
Hemoglobin: 9.2 g/dL — ABNORMAL LOW (ref 13.0–17.0)
Immature Granulocytes: 1 %
Lymphocytes Relative: 6 %
Lymphs Abs: 0.6 10*3/uL — ABNORMAL LOW (ref 0.7–4.0)
MCH: 29.5 pg (ref 26.0–34.0)
MCHC: 32.2 g/dL (ref 30.0–36.0)
MCV: 91.7 fL (ref 80.0–100.0)
Monocytes Absolute: 0.8 10*3/uL (ref 0.1–1.0)
Monocytes Relative: 8 %
Neutro Abs: 8.2 10*3/uL — ABNORMAL HIGH (ref 1.7–7.7)
Neutrophils Relative %: 84 %
Platelets: 277 10*3/uL (ref 150–400)
RBC: 3.12 MIL/uL — ABNORMAL LOW (ref 4.22–5.81)
RDW: 12.9 % (ref 11.5–15.5)
WBC: 9.8 10*3/uL (ref 4.0–10.5)
nRBC: 0 % (ref 0.0–0.2)

## 2023-05-09 LAB — BASIC METABOLIC PANEL
Anion gap: 9 (ref 5–15)
BUN: 34 mg/dL — ABNORMAL HIGH (ref 8–23)
CO2: 24 mmol/L (ref 22–32)
Calcium: 8.7 mg/dL — ABNORMAL LOW (ref 8.9–10.3)
Chloride: 105 mmol/L (ref 98–111)
Creatinine, Ser: 2.38 mg/dL — ABNORMAL HIGH (ref 0.61–1.24)
GFR, Estimated: 27 mL/min — ABNORMAL LOW (ref >60)
Glucose, Bld: 175 mg/dL — ABNORMAL HIGH (ref 70–99)
Potassium: 4.1 mmol/L (ref 3.5–5.1)
Sodium: 138 mmol/L (ref 135–145)

## 2023-05-09 LAB — T4, FREE: Free T4: 1.29 ng/dL — ABNORMAL HIGH (ref 0.61–1.12)

## 2023-05-09 LAB — C-REACTIVE PROTEIN: CRP: 23.6 mg/dL — ABNORMAL HIGH (ref <1.0)

## 2023-05-09 LAB — TSH: TSH: 1.15 u[IU]/mL (ref 0.350–4.500)

## 2023-05-09 LAB — MAGNESIUM: Magnesium: 1.9 mg/dL (ref 1.7–2.4)

## 2023-05-09 LAB — VAS US ABI WITH/WO TBI
Left ABI: 0.61
Right ABI: 0.66

## 2023-05-09 LAB — PROCALCITONIN: Procalcitonin: 0.85 ng/mL

## 2023-05-09 LAB — BRAIN NATRIURETIC PEPTIDE: B Natriuretic Peptide: 361.6 pg/mL — ABNORMAL HIGH (ref 0.0–100.0)

## 2023-05-09 MED ORDER — METOPROLOL TARTRATE 100 MG PO TABS: 100.0000 mg | ORAL_TABLET | Freq: Two times a day (BID) | ORAL | Status: DC

## 2023-05-09 MED ORDER — METOPROLOL TARTRATE 50 MG PO TABS
50.0000 mg | ORAL_TABLET | Freq: Two times a day (BID) | ORAL | Status: DC
Start: 2023-05-09 — End: 2023-05-11
  Administered 2023-05-09 – 2023-05-11 (×5): 50 mg via ORAL
  Filled 2023-05-09 (×5): qty 1

## 2023-05-09 NOTE — Plan of Care (Signed)

## 2023-05-09 NOTE — Progress Notes (Signed)
Physical Therapy Treatment Patient Details Name: Christian Bailey MRN: [id number] DOB: Mar 04, 1941 Today's Date: 05/09/2023   History of Present Illness The pt is an 82 yo male presenting 11/8 with reports of multiple falls at home and RLE pain. Work up revealed AKI on CKD III and tachycardia. No acute DVT. PMH includes: anemia, DM II, HLD, HTN, PAD, and RBBB.    PT Comments  The pt was agreeable to session, reports increased pain in L knee this session compared to evaluation yesterday. He continues to need significant assist (modA of 2) to complete sit-stand and hallway ambulation. He is limited by poor anterior wt shift, poor power in BLE, and increased pain in L knee when completing sit-stand transfers, and needs assist with ambulation as well as cues to widen BOS, improve posture, and assist to steady due to L knee hyperextension with stance. After lengthy discussion with the pt and his wife and re-explanation of possible therapy schedules, they are hopeful for intensive therapy after d/c to maximize functional safety and independence at home.     If plan is discharge home, recommend the following: Two people to help with walking and/or transfers;A lot of help with bathing/dressing/bathroom;Direct supervision/assist for medications management;Direct supervision/assist for financial management;Assist for transportation;Help with stairs or ramp for entrance   Can travel by private vehicle     Yes  Equipment Recommendations  BSC/3in1    Recommendations for Other Services Rehab consult     Precautions / Restrictions Precautions Precautions: Fall Precaution Comments: 2 falls in last 2 months Restrictions Weight Bearing Restrictions: No     Mobility  Bed Mobility Overal bed mobility: Needs Assistance Bed Mobility: Supine to Sit     Supine to sit: Mod assist, HOB elevated, Used rails     General bed mobility comments: modA with use of bed rails and assist at pad, assist to elevate  trunk    Transfers Overall transfer level: Needs assistance Equipment used: Rolling walker (2 wheels) Transfers: Sit to/from Stand, Bed to chair/wheelchair/BSC Sit to Stand: Mod assist, Max assist, +2 physical assistance   Step pivot transfers: Min assist, Mod assist, +2 physical assistance, +2 safety/equipment       General transfer comment: Mod-Max assist +2 to power up; Min-Mod assist +2 once in standing. poor anterior wt shift and pposterior lean in standing.    Ambulation/Gait Ambulation/Gait assistance: Mod assist Gait Distance (Feet): 45 Feet (+ 19ft) Assistive device: Rolling walker (2 wheels) Gait Pattern/deviations: Step-through pattern, Narrow base of support, Trunk flexed, Ataxic Gait velocity: decreased Gait velocity interpretation: <1.31 ft/sec, indicative of household ambulator   General Gait Details: pt with less UE tremors this date, able to progress ambualtion distance, continues to have difficulty maintaining normal BOS with RLE stepping on LLE at times.   Stairs             Wheelchair Mobility     Tilt Bed    Modified Rankin (Stroke Patients Only)       Balance Overall balance assessment: Needs assistance Sitting-balance support: No upper extremity supported, Feet supported Sitting balance-Leahy Scale: Fair Sitting balance - Comments: Pt able to maintain static sitting without UE suppoer but with posterior lean in dynamic sitting without UE support. Postural control: Posterior lean Standing balance support: Bilateral upper extremity supported, During functional activity, Reliant on assistive device for balance Standing balance-Leahy Scale: Poor Standing balance comment: dependent on BUE support and min-modA for static standing. mod-maxA with BUE support for gait  Cognition Arousal: Alert Behavior During Therapy: WFL for tasks assessed/performed Overall Cognitive Status: Impaired/Different from  baseline Area of Impairment: Attention, Memory, Following commands, Safety/judgement, Awareness, Problem solving                   Current Attention Level: Sustained Memory: Decreased short-term memory Following Commands: Follows one step commands consistently, Follows one step commands with increased time Safety/Judgement: Decreased awareness of safety, Decreased awareness of deficits Awareness: Emergent Problem Solving: Slow processing, Decreased initiation, Difficulty sequencing, Requires verbal cues General Comments: AAOx4. Presents with self-limiting behaviors and requires encouragement for participation. Pt not remembering information from beginning to end of session and requiring heavy repetition of training.        Exercises      General Comments General comments (skin integrity, edema, etc.): HR elevated to 120s with ambulation      Pertinent Vitals/Pain Pain Assessment Pain Assessment: Faces Faces Pain Scale: Hurts whole lot Pain Location: L knee Pain Descriptors / Indicators: Discomfort, Grimacing, Guarding Pain Intervention(s): Limited activity within patient's tolerance, Monitored during session, Repositioned    Home Living Family/patient expects to be discharged to:: Private residence Living Arrangements: Spouse/significant other Available Help at Discharge: Family;Available 24 hours/day Type of Home: House Home Access: Stairs to enter Entrance Stairs-Rails: Left Entrance Stairs-Number of Steps: 3   Home Layout: One level Home Equipment: Agricultural consultant (2 wheels);Rollator (4 wheels);Shower seat - built Scientist, clinical (histocompatibility and immunogenetics)      Prior Function            PT Goals (current goals can now be found in the care plan section) Acute Rehab PT Goals Patient Stated Goal: to return home PT Goal Formulation: With patient Time For Goal Achievement: 05/22/23 Potential to Achieve Goals: Good Progress towards PT goals: Progressing toward goals     Frequency    Min 1X/week       AM-PAC PT "6 Clicks" Mobility   Outcome Measure  Help needed turning from your back to your side while in a flat bed without using bedrails?: A Lot Help needed moving from lying on your back to sitting on the side of a flat bed without using bedrails?: A Lot Help needed moving to and from a bed to a chair (including a wheelchair)?: A Lot Help needed standing up from a chair using your arms (e.g., wheelchair or bedside chair)?: A Lot Help needed to walk in hospital room?: A Lot Help needed climbing 3-5 steps with a railing? : Total 6 Click Score: 11    End of Session Equipment Utilized During Treatment: Gait belt Activity Tolerance: Patient limited by fatigue Patient left: in chair;with call bell/phone within reach;with chair alarm set Nurse Communication: Mobility status PT Visit Diagnosis: Unsteadiness on feet (R26.81);Other abnormalities of gait and mobility (R26.89);Muscle weakness (generalized) (M62.81);Repeated falls (R29.6);Pain;Ataxic gait (R26.0) Pain - Right/Left: Right Pain - part of body: Leg     Time: 1311-1340 PT Time Calculation (min) (ACUTE ONLY): 29 min  Charges:    $Gait Training: 8-22 mins $Therapeutic Activity: 8-22 mins PT General Charges $$ ACUTE PT VISIT: 1 Visit                     Vickki Muff, PT, DPT   Acute Rehabilitation Department Office [phone] Secure Chat Communication Preferred   Ronnie Derby 05/09/2023, 3:30 PM

## 2023-05-09 NOTE — Plan of Care (Signed)

## 2023-05-09 NOTE — Progress Notes (Signed)
PROGRESS NOTE                                                                                                                                                                                                             Patient Demographics:    Christian Bailey, is a 82 y.o. male, DOB - [birth year], WUX:[id number]  Outpatient Primary MD for the patient is Avva, Ravisankar, MD    LOS - 1  Admit date - 05/07/2023    Chief Complaint  Patient presents with   Fall    multiple   Leg Pain    R worse than L       Brief Narrative (HPI from H&P)   82 y.o. male with medical history significant of hypertension, hyperlipidemia, diabetes, CKD 3B, carotid artery disease, PAD, macular degeneration legally blind, patient has ongoing chronic bilateral lower extremity pain from his PAD right more than left which is gradually getting worse, he tried to get an appointment with his vascular surgeon but was not able to get in the office in a timely fashion.  He came to the hospital to get some help for his worsening bilateral lower extremity pain, he was diagnosed with dehydration and AKI and admitted to the hospital.   Subjective:    Christian Bailey today has, No headache, No chest pain, No abdominal pain - No Nausea, No new weakness tingling or numbness,  no SOB, acute on chronic bilateral lower extremity pain right more than left   Assessment  & Plan :   AKI on CKD 3B - Due to combination of dehydration and ACE inhibitor use, hold ACE inhibitor, hydrate and monitor, renal function gradually improving.  His baseline creatinine is around 1.9.   Tachycardia - chronic according to the patient his heart rate runs around 120 baseline for the last 15 to 20 years, takes low-dose beta-blocker for it.  Continue.  No acute issues.  TSH is stable.  Free T4 mildly elevated, repeat in 4 to 6 weeks   PAD - Leg pain - > Known history of PAD status post multiple  interventions including right femoropopliteal bypass and subsequent aortobifem bypass.  Venous ultrasound unremarkable, ABIs noted with unchanged but moderate to severe bilateral lower extremity disease. Continue Plavix and statin for secondary prevention.  Pain control.  By vascular surgery, pain is mostly in  his thighs and not typical of PAD per vascular surgeon, old L-spine MRI noted with some L-spine disc disease, repeat L-spine MRI with without contrast discussed with radiology and nephrology Dr. Valentino Nose, GFR is 27 and stable for MRI contrast.   Hypertension - Holding lisinopril as above,  Continue metoprolol   Hyperlipidemia - Continue home rosuvastatin    Carotid artery disease > Known asymptomatic right carotid stenosis at 60-79%. - Continue home Plavix, rosuvastatin   Diabetes - SSI  CBG (last 3)  No results for input(s): "GLUCAP" in the last 72 hours.      Condition - Extremely Guarded  Family Communication  : Wife updated on 05/08/2023  Code Status : Full code  Consults  : VVS  PUD Prophylaxis :  PPI   Procedures  :     MRI L-spine.  Bilateral lower extremity ABIs and lower extremity bypass graft evaluation.    Right lower extremity venous duplex.  No DVT.      Disposition Plan  :    Status is: Observation  DVT Prophylaxis  :    heparin injection 5,000 Units Start: 05/07/23 2200    Lab Results  Component Value Date   PLT 277 05/09/2023    Diet :  Diet Order             Diet regular Fluid consistency: Thin  Diet effective now                    Inpatient Medications  Scheduled Meds:  clopidogrel  75 mg Oral Daily   feeding supplement  237 mL Oral BID BM   heparin  5,000 Units Subcutaneous Q8H   metoprolol tartrate  50 mg Oral BID   mirabegron ER  50 mg Oral Daily   pantoprazole  40 mg Oral BID   rosuvastatin  20 mg Oral QPM   sodium chloride flush  3 mL Intravenous Q12H   Continuous Infusions:   PRN Meds:.acetaminophen **OR**  acetaminophen, diltiazem, polyethylene glycol  Antibiotics  :    Anti-infectives (From admission, onward)    None         Objective:   Vitals:   05/08/23 2331 05/08/23 2332 05/09/23 0404 05/09/23 0820  BP: 116/63 116/63 (!) 110/55 121/61  Pulse: (!) 115 (!) 128 99 96  Resp: (!) 24 18 18 20   Temp: 98.6 F (37 C)  98.8 F (37.1 C) 98.2 F (36.8 C)  TempSrc: Oral  Oral Oral  SpO2: 93% 93% 98% 92%  Weight:      Height:        Wt Readings from Last 3 Encounters:  05/07/23 71.7 kg  11/02/22 66.7 kg  07/07/22 66.4 kg     Intake/Output Summary (Last 24 hours) at 05/09/2023 1013 Last data filed at 05/09/2023 0406 Gross per 24 hour  Intake --  Output 1500 ml  Net -1500 ml     Physical Exam  Awake Alert, No new F.N deficits, Normal affect Christian Bailey,PERRAL Supple Neck, No JVD,   Symmetrical Chest wall movement, Good air movement bilaterally, CTAB RRR,No Gallops,Rubs or new Murmurs,  +ve B.Sounds, Abd Soft, No tenderness,   No Cyanosis, Clubbing or edema        Data Review:    Recent Labs  Lab 05/07/23 1058 05/08/23 0418 05/09/23 0454  WBC 18.7* 12.7* 9.8  HGB 10.6* 9.2* 9.2*  HCT 31.8* 28.1* 28.6*  PLT 328 260 277  MCV 91.4 91.5 91.7  MCH 30.5 30.0 29.5  MCHC 33.3 32.7 32.2  RDW 13.2 13.1 12.9  LYMPHSABS 1.1  --  0.6*  MONOABS 1.3*  --  0.8  EOSABS 0.0  --  0.1  BASOSABS 0.1  --  0.0    Recent Labs  Lab 05/07/23 1058 05/07/23 1231 05/08/23 0418 05/09/23 0454  NA 134*  --  138 138  K 4.3  --  4.4 4.1  CL 98  --  105 105  CO2 26  --  24 24  ANIONGAP 10  --  9 9  GLUCOSE 189*  --  135* 175*  BUN 46*  --  33* 34*  CREATININE 2.67*  --  2.20* 2.38*  AST 13*  --  15  --   ALT 7  --  13  --   ALKPHOS 76  --  70  --   BILITOT 0.7  --  0.9  --   ALBUMIN 4.4  --  2.8*  --   CRP  --   --   --  23.6*  PROCALCITON  --   --   --  0.85  LATICACIDVEN  --  1.4  --   --   TSH  --   --  1.279  --   BNP  --   --   --  361.6*  MG 1.6*  --  1.9 1.9   CALCIUM 9.6  --  8.7* 8.7*      Recent Labs  Lab 05/07/23 1058 05/07/23 1231 05/08/23 0418 05/09/23 0454  CRP  --   --   --  23.6*  PROCALCITON  --   --   --  0.85  LATICACIDVEN  --  1.4  --   --   TSH  --   --  1.279  --   BNP  --   --   --  361.6*  MG 1.6*  --  1.9 1.9  CALCIUM 9.6  --  8.7* 8.7*    --------------------------------------------------------------------------------------------------------------- Lab Results  Component Value Date   TRIG 87 07/25/2018    Lab Results  Component Value Date   HGBA1C 5.9 (H) 01/20/2019     Micro Results No results found for this or any previous visit (from the past 240 hour(s)).  Radiology Reports VAS [place] ABI WITH/WO TBI  Result Date: 05/09/2023  LOWER EXTREMITY DOPPLER STUDY Patient Name:  DINNIE DUGGIN  Date of Exam:   05/08/2023 Medical Rec #: [id number]     Accession #:    [id number] Date of Birth: [birth year]     Patient Gender: M Patient Age:   43 years Exam Location:  [facility] Procedure:      VAS [place] ABI WITH/WO TBI Referring Phys: Lyn Hollingshead MELVIN --------------------------------------------------------------------------------  Indications: Peripheral artery disease. Ongoing claudication. Right knee and              thigh pain. High Risk         Hypertension, hyperlipidemia, Diabetes, past history of Factors:          smoking. Other Factors: CKD III, sinus tachycardia, Right bundle branch block with left                anterior fasicular block, asymptomatic right ICA stenosis,                60-79%.  Vascular Interventions: Left common iliac stenting 20 years ago, left external  iliac stent and right SFA stent 04/2010, 07/06/2018                         AO-Bifem BPG, 09/08/2017 Redo right femoral                         endarterectomy with angioplasty; 09/18/2016 Redo right                         femoral-popliteal BPG Right femoral-popliteal BPG. Limitations: Today's exam was limited due to  Tachycardia and arrythmia. Comparison Study: Prior ABI done 10/16/22 Performing Technologist: Sherren Kerns RVS  Examination Guidelines: A complete evaluation includes at minimum, Doppler waveform signals and systolic blood pressure reading at the level of bilateral brachial, anterior tibial, and posterior tibial arteries, when vessel segments are accessible. Bilateral testing is considered an integral part of a complete examination. Photoelectric Plethysmograph (PPG) waveforms and toe systolic pressure readings are included as required and additional duplex testing as needed. Limited examinations for reoccurring indications may be performed as noted.  ABI Findings: +---------+------------------+-----+----------+--------+ Right    Rt Pressure (mmHg)IndexWaveform  Comment  +---------+------------------+-----+----------+--------+ Brachial 136                    triphasic          +---------+------------------+-----+----------+--------+ PTA      93                0.66 monophasic         +---------+------------------+-----+----------+--------+ DP       75                0.54 monophasic         +---------+------------------+-----+----------+--------+ Great Toe39                0.28 Abnormal           +---------+------------------+-----+----------+--------+ +---------+------------------+-----+----------+-------+ Left     Lt Pressure (mmHg)IndexWaveform  Comment +---------+------------------+-----+----------+-------+ Brachial 140                    triphasic         +---------+------------------+-----+----------+-------+ PTA      75                0.54 monophasic        +---------+------------------+-----+----------+-------+ DP       86                0.61 monophasic        +---------+------------------+-----+----------+-------+ Great Toe42                0.30 Abnormal          +---------+------------------+-----+----------+-------+  +-------+-----------+-----------+------------+------------+ ABI/TBIToday's ABIToday's TBIPrevious ABIPrevious TBI +-------+-----------+-----------+------------+------------+ Right  0.66       0.28       0.59        0.38         +-------+-----------+-----------+------------+------------+ Left   0.61       0.30       0.69        0.46         +-------+-----------+-----------+------------+------------+ Bilateral ABIs appear essentially unchanged compared to prior study on 10/16/22. Right TBIs appear decreased since prior study done 10/16/22 and left TBIs appear slightly increased since prior study done 10/16/22.  Summary: Right: Resting right ankle-brachial index indicates moderate right lower extremity arterial disease. The right toe-brachial index  is abnormal. Left: Resting left ankle-brachial index indicates moderate left lower extremity arterial disease. The left toe-brachial index is abnormal. *See table(s) above for measurements and observations.  Electronically signed by Carolynn Sayers on 05/09/2023 at 3:52:11 AM.    Final    VAS [place] LOWER EXTREMITY BYPASS GRAFT DUPL  Result Date: 05/09/2023 LOWER EXTREMITY ARTERIAL DUPLEX STUDY Patient Name:  KIMM HODGMAN  Date of Exam:   05/08/2023 Medical Rec #: [id number]     Accession #:    [id number] Date of Birth: [birth year]     Patient Gender: M Patient Age:   73 years Exam Location:  [facility] Procedure:      VAS [place] LOWER EXTREMITY BYPASS GRAFT DUPLEX Referring Phys: Carolynn Sayers --------------------------------------------------------------------------------  Indications: Peripheral artery disease, and Right thigh and knee pain, ongoing              claudication. High Risk Factors: Hypertension, hyperlipidemia, Diabetes, past history of                    smoking. Other Factors: CKD III, sinus tachycardia, Right bundle branch block with                left anterior fascicular block, asymptomatic right ICA                stenosis,60-79%.   Vascular Interventions: Left common iliac stenting 20 years ago, left                         external iliac stent and right SFA stent 04/2010,                         07/06/2018                         AO-Bi fem BPG, 09/08/2017 Redo right femoral                         endarterectomy with angioplasty; 09/18/2016 Redo right                         femoral-popliteal BPG Right femoral-popliteal BPG. Current ABI:            R: 0.66/0.28 L;0.61/0.30 Limitations: Difficult to visualize vasculature/graft Comparison Study: Prior Right Bypass duplex done 10/02/2019 indicated occluded                   proximal portion of the fem-pop BPG with minimal retrograde                   to-and-fro flow observed in the mid an distal segments of the                   graft. Performing Technologist: Sherren Kerns RVS  Examination Guidelines: A complete evaluation includes B-mode imaging, spectral Doppler, color Doppler, and power Doppler as needed of all accessible portions of each vessel. Bilateral testing is considered an integral part of a complete examination. Limited examinations for reoccurring indications may be performed as noted.  +----------+--------+-----+--------+----------+-----------------+ RIGHT     PSV cm/sRatioStenosisWaveform  Comments          +----------+--------+-----+--------+----------+-----------------+ DFA       98                   biphasic                    +----------+--------+-----+--------+----------+-----------------+  SFA Prox               occluded                            +----------+--------+-----+--------+----------+-----------------+ SFA Mid                                  Collaterals noted +----------+--------+-----+--------+----------+-----------------+ SFA Distal                               Collaterals noted +----------+--------+-----+--------+----------+-----------------+ POP Prox  39                   monophasic                   +----------+--------+-----+--------+----------+-----------------+ ATA Mid   31                   monophasic                  +----------+--------+-----+--------+----------+-----------------+ PTA Prox  26                   monophasic                  +----------+--------+-----+--------+----------+-----------------+ PTA Mid   20                   monophasic                  +----------+--------+-----+--------+----------+-----------------+  Right Graft #1: Fem-Pop BPG +------------------+--------+--------+----------+-----------------------+                   PSV cm/sStenosisWaveform  Comments                +------------------+--------+--------+----------+-----------------------+ Inflow            298             monophasic                        +------------------+--------+--------+----------+-----------------------+ Prox Anastomosis  489             monophasic                        +------------------+--------+--------+----------+-----------------------+ Proximal Graft            occluded                                  +------------------+--------+--------+----------+-----------------------+ Mid Graft                                   minimal retrograde flow +------------------+--------+--------+----------+-----------------------+ Distal Graft                                not visualized          +------------------+--------+--------+----------+-----------------------+ Distal Anastomosis                          not visualized          +------------------+--------+--------+----------+-----------------------+ Outflow  39              monophasic                        +------------------+--------+--------+----------+-----------------------+   Summary: Right: Occluded proximal graft with minimal retrograde flow noted in the mid portion as noted on prior study in 2021. Collateral flow noted mid to distal thigh.  See table(s) above for  measurements and observations. Electronically signed by Carolynn Sayers on 05/09/2023 at 3:52:03 AM.    Final    DG Chest Port 1 View  Result Date: 05/08/2023 CLINICAL DATA:  Shortness of breath EXAM: PORTABLE CHEST 1 VIEW COMPARISON:  Chest x-ray 05/07/2023 FINDINGS: There is a nodular density overlying the left lower lung measuring approximally 13 mm, indeterminate, possibly a nipple shadow. The lungs are otherwise clear. There is no pleural effusion or pneumothorax. The cardiomediastinal silhouette is within normal limits. Right shoulder arthroplasty is present. No acute fractures. IMPRESSION: 13 mm nodular density overlying the left lower lung, indeterminate. Recommend repeat chest x-ray with nipple markers or follow-up chest CT. Electronically Signed   By: Darliss Cheney M.D.   On: 05/08/2023 20:33   DG Chest Portable 1 View  Result Date: 05/07/2023 CLINICAL DATA:  Weakness, multiple falls. EXAM: PORTABLE CHEST 1 VIEW COMPARISON:  January 18, 2019. FINDINGS: The heart size and mediastinal contours are within normal limits. Both lungs are clear. Status post right shoulder arthroplasty. IMPRESSION: No active disease. Electronically Signed   By: Lupita Raider M.D.   On: 05/07/2023 16:40   DG Knee Complete 4 Views Right  Result Date: 05/07/2023 CLINICAL DATA:  82 year old male with right hip and knee pain. No known injury. EXAM: RIGHT KNEE - COMPLETE 4+ VIEW COMPARISON:  Right hip series today. FINDINGS: Femoral artery vascular stents continue to the distal 3rd femoral shaft. Surgical clips along the medial leg. Bone mineralization is within normal limits for age. Maintained alignment at the right knee. No joint effusion on the cross-table lateral view. Normal joint spaces for age. Patella intact. No acute osseous abnormality identified. IMPRESSION: 1. No acute osseous abnormality or age advanced degeneration at the right knee. 2. Extensive right femoral artery vascular stents. Electronically Signed   By: Odessa Fleming M.D.   On: 05/07/2023 12:54   DG Hip Unilat W or Wo Pelvis 2-3 Views Right  Result Date: 05/07/2023 CLINICAL DATA:  82 year old male with right hip and knee pain. No known injury. EXAM: DG HIP (WITH OR WITHOUT PELVIS) 2-3V RIGHT COMPARISON:  CT Abdomen and Pelvis 01/18/2019. FINDINGS: Chronic iliac artery vascular stents. Bone mineralization is within normal limits for age. Pelvis appears intact. Femoral heads are normally located. Chronic bilateral acetabular degenerative spurring. Grossly intact proximal left femur. Proximal right femur appears intact. Extensive right femoral artery vascular stents. Multiple right leg surgical clips. No acute osseous abnormality identified. Negative visible bowel gas. IMPRESSION: 1. No acute osseous abnormality identified about the pelvis or right hip. 2. Chronic iliac and right femoral artery vascular stents. Electronically Signed   By: Odessa Fleming M.D.   On: 05/07/2023 12:53   US Venous Img Lower Right (DVT Study)  Result Date: 05/07/2023 CLINICAL DATA:  leg pain. History. The with reported lower extremity bypass EXAM: RIGHT LOWER EXTREMITY VENOUS DOPPLER ULTRASOUND TECHNIQUE: Gray-scale sonography with compression, as well as color and duplex ultrasound, were performed to evaluate the deep venous system(s) from the level of the common femoral vein through the popliteal and proximal  calf veins. COMPARISON:  RIGHT lower extremity XRs, 05/07/2023 FINDINGS: VENOUS Normal compressibility of the common femoral, superficial femoral, and popliteal veins, as well as the visualized calf veins. Visualized portions of profunda femoral vein and great saphenous vein unremarkable. No filling defects to suggest DVT on grayscale or color Doppler imaging. Doppler waveforms show normal direction of venous flow, normal respiratory plasticity and response to augmentation. Limited views of the contralateral common femoral vein are unremarkable. OTHER No evidence of superficial  thrombophlebitis or abnormal fluid collection. Limitations: none IMPRESSION: No evidence of femoropopliteal DVT or superficial thrombophlebitis within the RIGHT lower extremity. Roanna Banning, MD [facility] Radiology Electronically Signed   By: Roanna Banning M.D.   On: 05/07/2023 12:02      Signature  -   Susa Raring M.D on 05/09/2023 at 10:13 AM   -  To page go to [email or web address]

## 2023-05-09 NOTE — Evaluation (Signed)
Occupational Therapy Evaluation Patient Details Name: Christian Bailey MRN: [id number] DOB: 17-Jul-1940 Today's Date: 05/09/2023   History of Present Illness The pt is an 82 yo male presenting 11/8 with reports of multiple falls at home and RLE pain. Work up revealed AKI on CKD III and tachycardia. No acute DVT. PMH includes: anemia, DM II, HLD, HTN, PAD, and RBBB.   Clinical Impression   At baseline, pt is Independent to Min assist for ADLs and performs functional mobility with a RW in the home and Rollator in the community. Pt now presents with decreased activity tolerance, decreased cognition, generalized B UE weakness, decreased balance and decreased safety and independence with functional tasks. Pt currently demonstrates ability to complete UB ADLs with Set up to Min assist, LB ADLs with Mod to Max assist, and functional transfers/mobility with a RW with Mod-Max assist +2 to power up and Min-Mod assist +2 once in standing. Pt will benefit from acute skilled OT services to address deficits outlined below, decrease caregiver burden, and increase safety and independence with functional tasks. Post acute discharge, pt will benefit from intensive inpatient skilled rehab services > 3 hours per day to maximize rehab potential.       If plan is discharge home, recommend the following: Two people to help with walking and/or transfers;Two people to help with bathing/dressing/bathroom;Assistance with cooking/housework;Direct supervision/assist for medications management;Direct supervision/assist for financial management;Assist for transportation;Help with stairs or ramp for entrance    Functional Status Assessment  Patient has had a recent decline in their functional status and demonstrates the ability to make significant improvements in function in a reasonable and predictable amount of time.  Equipment Recommendations  Other (comment) (defer to next level of care)    Recommendations for Other Services  Rehab consult     Precautions / Restrictions Precautions Precautions: Fall Precaution Comments: 2 falls in last 2 months Restrictions Weight Bearing Restrictions: No      Mobility Bed Mobility Overal bed mobility: Needs Assistance Bed Mobility: Supine to Sit     Supine to sit: Mod assist, HOB elevated, Used rails     General bed mobility comments: modA with use of bed rails and assist at pad, assist to elevate trunk    Transfers Overall transfer level: Needs assistance Equipment used: Rolling walker (2 wheels) Transfers: Sit to/from Stand, Bed to chair/wheelchair/BSC Sit to Stand: Mod assist, Max assist, +2 physical assistance     Step pivot transfers: Min assist, Mod assist, +2 physical assistance, +2 safety/equipment     General transfer comment: Mod-Max assist +2 to power up; Min-Mod assist +2 once in standing      Balance Overall balance assessment: Needs assistance Sitting-balance support: No upper extremity supported, Feet supported Sitting balance-Leahy Scale: Fair Sitting balance - Comments: Pt able to maintain static sitting without UE suppoer but with posterior lean in dynamic sitting without UE support. Postural control: Posterior lean Standing balance support: Bilateral upper extremity supported, During functional activity, Reliant on assistive device for balance Standing balance-Leahy Scale: Poor                             ADL either performed or assessed with clinical judgement   ADL Overall ADL's : Needs assistance/impaired Eating/Feeding: Set up;Sitting   Grooming: Contact guard assist;Sitting;Cueing for sequencing   Upper Body Bathing: Minimal assistance;Sitting;Cueing for sequencing;Cueing for compensatory techniques   Lower Body Bathing: Maximal assistance;Sit to/from stand;Sitting/lateral leans;Cueing for sequencing;Cueing for compensatory techniques;Cueing for  safety   Upper Body Dressing : Minimal assistance;Sitting;Cueing  for sequencing;Cueing for compensatory techniques   Lower Body Dressing: Maximal assistance;Sitting/lateral leans;Sit to/from stand   Toilet Transfer: Moderate assistance;Maximal assistance;Minimal assistance;+2 for physical assistance;+2 for safety/equipment;Cueing for safety;Cueing for sequencing;Ambulation;BSC/3in1;Rolling walker (2 wheels) Toilet Transfer Details (indicate cue type and reason): Mod-Max assist +2 to powwer up then Min-Mod assist +2 once in standing Toileting- Clothing Manipulation and Hygiene: Total assistance;Sit to/from stand       Functional mobility during ADLs: Minimal assistance;Moderate assistance;Maximal assistance;+2 for physical assistance;+2 for safety/equipment;Rolling walker (2 wheels);Cueing for sequencing;Cueing for safety (Mod-Max assist +2 to powwer up then Min-Mod assist +2 once in standing) General ADL Comments: Pt with decreased activity tolerance during funcitonal tasks. Pt requiring heavy repitition of training.     Vision Baseline Vision/History: 3 Glaucoma;1 Wears glasses Ability to See in Adequate Light: 2 Moderately impaired Patient Visual Report: No change from baseline       Perception         Praxis         Pertinent Vitals/Pain Pain Assessment Pain Assessment: Faces Faces Pain Scale: Hurts even more Pain Location: R LE Pain Descriptors / Indicators: Discomfort, Grimacing, Guarding Pain Intervention(s): Limited activity within patient's tolerance, Monitored during session, Repositioned     Extremity/Trunk Assessment Upper Extremity Assessment Upper Extremity Assessment: Right hand dominant;RUE deficits/detail;Generalized weakness (naturally R hand dominant; has retrained himself to predominantly use L hand) RUE Deficits / Details: limited after R total shoulder surgery, <90 deg flexion and abduction   Lower Extremity Assessment Lower Extremity Assessment: Defer to PT evaluation   Cervical / Trunk Assessment Cervical / Trunk  Assessment: Kyphotic   Communication Communication Communication: No apparent difficulties Cueing Techniques: Verbal cues   Cognition Arousal: Alert Behavior During Therapy: WFL for tasks assessed/performed Overall Cognitive Status: Impaired/Different from baseline Area of Impairment: Attention, Memory, Following commands, Safety/judgement, Awareness, Problem solving                   Current Attention Level: Sustained Memory: Decreased short-term memory Following Commands: Follows one step commands consistently, Follows one step commands with increased time Safety/Judgement: Decreased awareness of safety, Decreased awareness of deficits Awareness: Emergent Problem Solving: Slow processing, Decreased initiation, Difficulty sequencing, Requires verbal cues General Comments: AAOx4. Presents with self-limiting behaviors and requires encouragement for participation. Pt not remembering information from beginning to end of session and requiring heavy repetition of training.     General Comments  HR elevated into upper 120s with activity. O2 largely >/92% on RA throughout session with brief dips into the high 80s during standing/ambulation with O2 recovering quickly with rest. Pt's wife present throughout session and son present through a portion of session.    Exercises     Shoulder Instructions      Home Living Family/patient expects to be discharged to:: Private residence Living Arrangements: Spouse/significant other Available Help at Discharge: Family;Available 24 hours/day Type of Home: House Home Access: Stairs to enter [employer] of Steps: 3 Entrance Stairs-Rails: Left Home Layout: One level     Bathroom Shower/Tub: Producer, television/film/video: Handicapped height     Home Equipment: Agricultural consultant (2 wheels);Rollator (4 wheels);Shower seat - built Insurance claims handler        Prior Functioning/Environment  Prior Level of Function : Needs assist;History of Falls (last six months)             Mobility Comments: pt using RW in home, rollator in  community. sideways on stairs for BUE support on rail. reports sit-stand can take 5-6 tries ADLs Comments: wife recently has helped with sponge bath, typically pt gets in shower. Pt recently fell putting on underwear while standing. Pt reports use of sock aid to donn socks        OT Problem List: Decreased strength;Decreased activity tolerance;Impaired balance (sitting and/or standing);Decreased cognition;Decreased safety awareness;Pain      OT Treatment/Interventions: Self-care/ADL training;Therapeutic exercise;DME and/or AE instruction;Therapeutic activities;Patient/family education;Balance training    OT Goals(Current goals can be found in the care plan section) Acute Rehab OT Goals Patient Stated Goal: to return home with wife OT Goal Formulation: With patient/family Time For Goal Achievement: 05/23/23 Potential to Achieve Goals: Good ADL Goals Pt Will Perform Grooming: with contact guard assist;standing Pt Will Perform Lower Body Bathing: with contact guard assist;with adaptive equipment;sitting/lateral leans;sit to/from stand Pt Will Perform Lower Body Dressing: with contact guard assist;with adaptive equipment;sitting/lateral leans;sit to/from stand Pt Will Transfer to Toilet: with contact guard assist;ambulating;regular height toilet (with least restrictive AD) Pt Will Perform Toileting - Clothing Manipulation and hygiene: with min assist;sit to/from stand;sitting/lateral leans  OT Frequency: Min 1X/week    Co-evaluation              AM-PAC OT "6 Clicks" Daily Activity     Outcome Measure Help from another person eating meals?: A Little Help from another person taking care of personal grooming?: A Little Help from another person toileting, which includes using toliet, bedpan, or urinal?: Total Help from another person bathing  (including washing, rinsing, drying)?: A Lot Help from another person to put on and taking off regular upper body clothing?: A Little Help from another person to put on and taking off regular lower body clothing?: A Lot 6 Click Score: 14   End of Session Equipment Utilized During Treatment: Gait belt;Rolling walker (2 wheels) Nurse Communication: Mobility status  Activity Tolerance: Patient tolerated treatment well;Patient limited by pain Patient left: in chair;with call bell/phone within reach;with chair alarm set;with family/visitor present  OT Visit Diagnosis: Unsteadiness on feet (R26.81);Other abnormalities of gait and mobility (R26.89);Repeated falls (R29.6);History of falling (Z91.81);Muscle weakness (generalized) (M62.81);Other (comment) (decreased activity tolerance)                Time: 1247-1310 OT Time Calculation (min): 23 min Charges:  OT General Charges $OT Visit: 1 Visit OT Evaluation $OT Eval Low Complexity: 1 Low  Jordanna Hendrie "Kyle" M., OTR/L, MA Acute Rehab [phone]   Lendon Colonel 05/09/2023, 3:15 PM

## 2023-05-09 NOTE — TOC Initial Note (Deleted)
Transition of Care [facility]) - Initial/Assessment Note    Patient Details  Name: Christian Bailey MRN: [id number] Date of Birth: 10/10/1940  [facility] of [facility]) CM/SW Contact:    Ralene Bathe, LCSW Phone Number: 05/09/2023, 12:25 PM  Clinical Narrative:                 CSW received consult for possible SNF placement at time of discharge. CSW spoke with patient and spouse.  Patient and spouse expressed understanding of PT recommendation and is agreeable for LCSW to fax SNF referral to facilities in [place]. There is no facility preference at this time.  CSW discussed insurance authorization process and will provide website to review Medicare SNF rating list. CSW will send out referrals for review and provide bed offers as available.   Skilled Nursing Rehab Facilities-   [email or web address]   Ratings out of 5 stars (5 the highest)   Name Address  Phone # Quality Care Staffing Health Inspection Overall  [facility] [address] [phone] 2 2 5 5   [facility] [address], [place] [phone] 4 2 4 4   [facility] [address], [place] [phone] [facility] ([place]) [address], [place] [phone] 3 1 4 3   [facility]  [address], [place] [phone] 4 4 5 5   [facility] [address] [facility] [phone] 3 2 2 2   [facility] [address] [place] [phone] 5 1 2 2   [facility] [address] [place] [phone] 1 1 3 1   [address] ([place]) [address], [place] [phone] 2 2 2 2   [facility] [address] [place], [place] [phone] 2 2 1 1   [facility] ([place]) [address], [place] [phone] 3 1 1 1   [facility] [address] [place] [phone] 3 3 4 4   [facility] [address], [place] [phone] 2 2 3 3           [facility] [address], [place] [phone] 4 2 1 1   [facility], [place] [address], [place] [phone] 1 1 2 1   [facility] [address] [place] [phone] 2 1 4 3   [facility] [address] [place] [phone] 2 1 4 3   [facility] [address] [place] [phone] 2 3 3 3           [address] (no [facility]) [address], [place] [phone] 4 5 5 5   [facility] (No [employer]) [address], [place] [phone] 1 2 4 3   [facility] [address] [place] [phone] 2 1 2 1   [facility] (No [facility]) [address], [place] [phone] 4 1 5 4   [facility] [address] [facility] [phone] 3 4 2 2   [facility] [address] [place] [phone] 2 1 1 1   [facility] [address] [place] [phone] 4 2 5 5   [facility]  [address] [place] [phone] 2 2 3 3   [facility] [address] [place] [phone] 4 1 1 1   [facility] [address] [place], [place] [phone] 2 2 3 3           [facility] [address] [place] [phone] 2 1 1 1   [facility] [address], [place]  [phone] 3 3 3 3   [facility] (No Humana) [address], [place] [phone]  2 2 4 4   [facility]) [address], [place] [phone] 2 1 1 1   [facility] [place] [address] [place] [phone] [facility] [address], [place] [phone] 1 1 1 1           [facility] [address] [place], [place] [phone] 5 4 5 5   [facility])  [address] [place] [phone] 1 1 2 1   [facility]) [address] [place] [phone]  2 4 4   [facility] [address], [place] [phone] 3 5 5 5   [address] [place], [place] [phone] 4 2 2 2   [facility]) [address] [place] [phone] 2 1 3  2     Expected Discharge Plan: Skilled Nursing Facility Barriers to Discharge: Continued Medical Work up, SNF Pending bed offer   Patient Goals and CMS Choice            Expected Discharge Plan and Services In-house Referral: Clinical Social Work     Living arrangements for the past 2 months: Single Family Home                                      Prior Living Arrangements/Services Living arrangements for the past 2 months: Single Family Home Lives with:: Spouse Patient language and need for interpreter reviewed:: Yes Do you feel safe going back to the place where you live?: Yes      Need for Family Participation in Patient Care: Yes (Comment) Care giver support system in place?: No (comment)   Criminal Activity/Legal Involvement Pertinent to Current Situation/Hospitalization: No - Comment as needed  Activities of Daily Living   ADL Screening (condition at time of admission) Independently performs ADLs?: No Does the patient have a NEW difficulty with bathing/dressing/toileting/self-feeding that is expected to last >3 days?: No Does the patient have a NEW difficulty with getting in/out of bed, walking, or climbing stairs that is expected to last >3 days?: No Does the patient have a NEW difficulty with communication that is expected to last >3 days?: No Is the patient deaf or have difficulty hearing?: Yes Does the patient have difficulty seeing, even when wearing glasses/contacts?: Yes Does the patient have difficulty concentrating, remembering, or making decisions?: No  Permission Sought/Granted   Permission granted to share information with : Yes, Verbal Permission Granted     Permission granted to share info w AGENCY: SNFs        Emotional Assessment   Attitude/Demeanor/Rapport: Engaged Affect (typically observed): Adaptable Orientation: : Oriented to Place, Oriented to Self, Oriented to Situation, Oriented to  Time Alcohol / Substance Use: Not  Applicable Psych Involvement: No (comment)  Admission diagnosis:  Tachycardia [R00.0] Generalized weakness [R53.1] Chronic kidney disease, unspecified CKD stage [N18.9] Acute renal failure superimposed on stage 3b chronic kidney disease, unspecified acute renal failure type (HCC) [N17.9, N18.32] Patient Active Problem List   Diagnosis Date Noted   Acute renal failure superimposed on stage 3b chronic kidney disease, unspecified acute renal failure type (HCC) 05/07/2023   Leukocytosis 05/07/2023   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) 03/25/2020   Exudative age-related macular degeneration of right eye with inactive choroidal neovascularization (HCC) 03/25/2020   Advanced nonexudative age-related macular degeneration of right eye with subfoveal involvement 03/25/2020  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 involvement 03/25/2020   Bacteremia due to Gram-negative bacteria 01/19/2019   CKD (chronic kidney disease) stage 3, GFR 30-59 ml/min (HCC) 01/19/2019   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) 07/06/2018   S/P shoulder replacement, right 12/07/2016   PVD (peripheral vascular disease) (HCC) 09/18/2016   Sinus tachycardia 05/15/2014   Carotid artery disease (HCC) 04/26/2014   PAD (peripheral artery disease) (HCC) 06/12/2013   RBBB (right bundle branch block with left anterior fascicular block) 06/12/2013   Claudication (HCC) 12/14/2012   Essential hypertension 12/14/2012   Hyperlipidemia 12/14/2012   Type 2 diabetes mellitus (HCC) 12/14/2012   History of colonic polyps 01/25/2012   DM 08/11/2010   Acute duodenal ulcer with hemorrhage 08/11/2010   PCP:  Chilton Greathouse, MD Pharmacy:   [id number] - [place], [place] - [address] [place] [address] Phone: [phone] Fax:  [phone]  [id number] - [place], [place] - [address] [place] [address] Phone: [phone] Fax: [phone]  [facility] [place], [place] - [address] [place] [address] Phone: [phone] Fax: [phone]  [id number] [place], [place] - [address] [place] [address] Phone: [phone] Fax: [phone]     Social Determinants of Health (SDOH) Social History: SDOH Screenings   Food Insecurity: No Food Insecurity (05/07/2023)  Housing: Low Risk  (05/07/2023)  Transportation Needs: No Transportation Needs (05/07/2023)  Utilities: Not At Risk (05/07/2023)  Social Connections: Unknown (09/04/2022)   [facility]  Tobacco Use: Medium Risk (05/07/2023)   SDOH Interventions:     Readmission Risk Interventions     No data to display

## 2023-05-09 NOTE — Progress Notes (Signed)
  Daily Progress Note  S/p:n/a  Subjective: No complaints this morning, reports right knee and thigh pain better than yesterday.  Objective: Vitals:   05/09/23 0404 05/09/23 0820  BP: (!) 110/55 121/61  Pulse: 99 96  Resp: 18 20  Temp: 98.8 F (37.1 C) 98.2 F (36.8 C)  SpO2: 98% 92%    Physical Examination No acute distress, seen sleeping in chair this morning Palpable femorals bilaterally  ABI:  Right: 0.66, toe pressure 39 Left: 0.61, toe pressure 42 Similar from his outpatient visit in April  Right lower extremity duplex demonstrates known chronic occlusion of right femoral-popliteal graft, no surrounding fluid or other signs of infection  ASSESSMENT/PLAN:  82 year old male with PAD who is status post aortobifemoral bypass and right femoral-popliteal bypass that is known to be occluded.  He presented for right knee and thigh pain with a leukocytosis to 18. His symptoms are unlikely to be due to a vascular etiology.  No signs of infection in the femoropopliteal bypass.  He is well-known to my partner Dr. Myra Gianotti and I will inform him that he is admitted although do not anticipate any vascular intervention given that the knee and thigh pain are unlikely to be due to a vascular issue.  Daria Pastures MD [facility] [phone] 05/09/2023  10:41 AM

## 2023-05-09 NOTE — [place] FL2 (Addendum)
[place] MEDICAID FL2 LEVEL OF CARE FORM     IDENTIFICATION  Patient Name: Christian Bailey Birthdate: 01/13/1941 Sex: male Admission Date (Current Location): 05/07/2023  [facility] and [place] Number:  Producer, television/film/video and Address:  [facility], [address] [place], [place] [address]      Provider Number: [id number]  Attending Physician Name and Address:  Leroy Sea, MD  Relative Name and Phone Number:       Current Level of Care: Hospital Recommended Level of Care: Skilled Nursing Facility Prior Approval Number:    Date Approved/Denied:   PASRR Number:  [id number]   Discharge Plan: SNF    Current Diagnoses: Patient Active Problem List   Diagnosis Date Noted   Acute renal failure superimposed on stage 3b chronic kidney disease, unspecified acute renal failure type (HCC) 05/07/2023   Leukocytosis 05/07/2023   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) 03/25/2020   Exudative age-related macular degeneration of right eye with inactive choroidal neovascularization (HCC) 03/25/2020   Advanced nonexudative age-related macular degeneration of right eye with subfoveal involvement 03/25/2020   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 involvement 03/25/2020   Bacteremia due to Gram-negative bacteria 01/19/2019   CKD (chronic kidney disease) stage 3, GFR 30-59 ml/min (HCC) 01/19/2019   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) 07/06/2018   S/P shoulder replacement, right 12/07/2016   PVD (peripheral vascular disease) (HCC) 09/18/2016   Sinus tachycardia 05/15/2014   Carotid artery disease (HCC) 04/26/2014   PAD (peripheral artery disease) (HCC) 06/12/2013   RBBB (right bundle branch block with left anterior fascicular block) 06/12/2013   Claudication (HCC) 12/14/2012   Essential hypertension 12/14/2012    Hyperlipidemia 12/14/2012   Type 2 diabetes mellitus (HCC) 12/14/2012   History of colonic polyps 01/25/2012   DM 08/11/2010   Acute duodenal ulcer with hemorrhage 08/11/2010    Orientation RESPIRATION BLADDER Height & Weight     Self, Time, Situation, Place  O2 (2L) Incontinent Weight: 158 lb (71.7 kg) Height:  5\' 4"  (162.6 cm)  BEHAVIORAL SYMPTOMS/MOOD NEUROLOGICAL BOWEL NUTRITION STATUS      Continent Diet (thin liquids)  AMBULATORY STATUS COMMUNICATION OF NEEDS Skin   Extensive Assist Verbally Normal                       Personal Care Assistance Level of Assistance  Bathing, Feeding, Dressing Bathing Assistance: Maximum assistance Feeding assistance: Independent Dressing Assistance: Maximum assistance     Functional Limitations Info  Sight, Hearing, Speech Sight Info: Adequate Hearing Info: Adequate Speech Info: Adequate    SPECIAL CARE FACTORS FREQUENCY  PT (By licensed PT), OT (By licensed OT)     PT Frequency: 5x OT Frequency: 5x            Contractures Contractures Info: Not present    Additional Factors Info  Code Status, Allergies Code Status Info: full code Allergies Info: Asa (Aspirin), Gadolinium Derivatives, Iodinated Contrast Media, Pneumococcal 13-val Conj Vacc, Scopolamine, Oxycodone-acetaminophen           Current Medications (05/09/2023):  This is the current hospital active medication list Current Facility-Administered Medications  Medication Dose Route Frequency Provider Last Rate Last Admin   acetaminophen (TYLENOL) tablet 650 mg  650 mg Oral Q6H PRN Synetta Fail, MD       Or   acetaminophen (TYLENOL) suppository 650 mg  650 mg Rectal Q6H  PRN Synetta Fail, MD       clopidogrel (PLAVIX) tablet 75 mg  75 mg Oral Daily Synetta Fail, MD   75 mg at 05/09/23 0837   diltiazem (CARDIZEM) injection 10 mg  10 mg Intravenous Q6H PRN Leroy Sea, MD   10 mg at 05/08/23 1829   feeding supplement (ENSURE ENLIVE /  ENSURE PLUS) liquid 237 mL  237 mL Oral BID BM Leroy Sea, MD   237 mL at 05/09/23 0837   heparin injection 5,000 Units  5,000 Units Subcutaneous Q8H Synetta Fail, MD   5,000 Units at 05/09/23 1239   metoprolol tartrate (LOPRESSOR) tablet 50 mg  50 mg Oral BID Leroy Sea, MD   50 mg at 05/09/23 0837   mirabegron ER (MYRBETRIQ) tablet 50 mg  50 mg Oral Daily Synetta Fail, MD   50 mg at 05/09/23 0838   pantoprazole (PROTONIX) EC tablet 40 mg  40 mg Oral BID Synetta Fail, MD   40 mg at 05/09/23 0837   polyethylene glycol (MIRALAX / GLYCOLAX) packet 17 g  17 g Oral Daily PRN Synetta Fail, MD       rosuvastatin (CRESTOR) tablet 20 mg  20 mg Oral QPM Synetta Fail, MD   20 mg at 05/08/23 1819   sodium chloride flush (NS) 0.9 % injection 3 mL  3 mL Intravenous Q12H Synetta Fail, MD   3 mL at 05/09/23 9629     Discharge Medications: Please see discharge summary for a list of discharge medications.  Relevant Imaging Results:  Relevant Lab Results:   Additional Information [id number]  Maree Krabbe, LCSW

## 2023-05-09 NOTE — TOC Initial Note (Signed)
Transition of Care [facility]) - Initial/Assessment Note    Patient Details  Name: Christian Bailey MRN: [id number] Date of Birth: 1940/07/20  [facility]) CM/SW Contact:    Maree Krabbe, LCSW Phone Number: 05/09/2023, 12:45 PM  Clinical Narrative:   SW spoke with pt's spouse who was present at bedside. Pt lives at home with spouse but she is understanding that he needs rehab prior to discharging back home. Pt's spouse would like a facility close to their home. Pt's spouse agreeable to a [facility] f/o. SW to provide b/o once available.            Expected Discharge Plan: Skilled Nursing Facility Barriers to Discharge: Continued Medical Work up, SNF Pending bed offer   Patient Goals and CMS Choice            Expected Discharge Plan and Services In-house Referral: Clinical Social Work     Living arrangements for the past 2 months: Single Family Home                                      Prior Living Arrangements/Services Living arrangements for the past 2 months: Single Family Home Lives with:: Spouse Patient language and need for interpreter reviewed:: Yes Do you feel safe going back to the place where you live?: Yes      Need for Family Participation in Patient Care: Yes (Comment) Care giver support system in place?: No (comment)   Criminal Activity/Legal Involvement Pertinent to Current Situation/Hospitalization: No - Comment as needed  Activities of Daily Living   ADL Screening (condition at time of admission) Independently performs ADLs?: No Does the patient have a NEW difficulty with bathing/dressing/toileting/self-feeding that is expected to last >3 days?: No Does the patient have a NEW difficulty with getting in/out of bed, walking, or climbing stairs that is expected to last >3 days?: No Does the patient have a NEW difficulty with communication that is expected to last >3 days?: No Is the patient deaf or have difficulty hearing?: Yes Does the  patient have difficulty seeing, even when wearing glasses/contacts?: Yes Does the patient have difficulty concentrating, remembering, or making decisions?: No  Permission Sought/Granted   Permission granted to share information with : Yes, Verbal Permission Granted     Permission granted to share info w AGENCY: SNFs        Emotional Assessment   Attitude/Demeanor/Rapport: Engaged Affect (typically observed): Adaptable Orientation: : Oriented to Place, Oriented to Self, Oriented to Situation, Oriented to  Time Alcohol / Substance Use: Not Applicable Psych Involvement: No (comment)  Admission diagnosis:  Tachycardia [R00.0] Generalized weakness [R53.1] Chronic kidney disease, unspecified CKD stage [N18.9] Acute renal failure superimposed on stage 3b chronic kidney disease, unspecified acute renal failure type (HCC) [N17.9, N18.32] Patient Active Problem List   Diagnosis Date Noted   Acute renal failure superimposed on stage 3b chronic kidney disease, unspecified acute renal failure type (HCC) 05/07/2023   Leukocytosis 05/07/2023   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) 03/25/2020   Exudative age-related macular degeneration of right eye with inactive choroidal neovascularization (HCC) 03/25/2020   Advanced nonexudative age-related macular degeneration of right eye with subfoveal involvement 03/25/2020   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 involvement 03/25/2020   Bacteremia due to Gram-negative bacteria 01/19/2019   CKD (chronic kidney disease) stage 3, GFR 30-59 ml/min (HCC)  01/19/2019   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) 07/06/2018   S/P shoulder replacement, right 12/07/2016   PVD (peripheral vascular disease) (HCC) 09/18/2016   Sinus tachycardia 05/15/2014   Carotid artery disease (HCC) 04/26/2014   PAD (peripheral artery disease)  (HCC) 06/12/2013   RBBB (right bundle branch block with left anterior fascicular block) 06/12/2013   Claudication (HCC) 12/14/2012   Essential hypertension 12/14/2012   Hyperlipidemia 12/14/2012   Type 2 diabetes mellitus (HCC) 12/14/2012   History of colonic polyps 01/25/2012   DM 08/11/2010   Acute duodenal ulcer with hemorrhage 08/11/2010   PCP:  Chilton Greathouse, MD Pharmacy:   [id number] [address] - [place], [place] - [address] [place] [address] Phone: [phone] Fax: [phone]  [id number] - [place], [place] - [address] [place] [address] Phone: [phone] Fax: [phone]  [facility] - [place], [place] - [address] [place] [address] Phone: [phone] Fax: [phone]  [id number] [place], [place] - [address] [place] [address] Phone: [phone] Fax: [phone]     Social Determinants of Health (SDOH) Social History: SDOH Screenings   Food Insecurity: No Food Insecurity (05/07/2023)  Housing: Low Risk  (05/07/2023)  Transportation Needs: No Transportation Needs (05/07/2023)  Utilities: Not At Risk (05/07/2023)  Social Connections: Unknown (09/04/2022)   Received from [facility]  Tobacco Use: Medium Risk (05/07/2023)   SDOH Interventions:     Readmission Risk Interventions     No data to display

## 2023-05-10 ENCOUNTER — Inpatient Hospital Stay (HOSPITAL_COMMUNITY): Payer: Medicare Other

## 2023-05-10 DIAGNOSIS — N179 Acute kidney failure, unspecified: Secondary | ICD-10-CM | POA: Diagnosis not present

## 2023-05-10 DIAGNOSIS — N1832 Chronic kidney disease, stage 3b: Secondary | ICD-10-CM | POA: Diagnosis not present

## 2023-05-10 LAB — BASIC METABOLIC PANEL
Anion gap: 9 (ref 5–15)
BUN: 36 mg/dL — ABNORMAL HIGH (ref 8–23)
CO2: 24 mmol/L (ref 22–32)
Calcium: 8.8 mg/dL — ABNORMAL LOW (ref 8.9–10.3)
Chloride: 103 mmol/L (ref 98–111)
Creatinine, Ser: 2.43 mg/dL — ABNORMAL HIGH (ref 0.61–1.24)
GFR, Estimated: 26 mL/min — ABNORMAL LOW (ref >60)
Glucose, Bld: 155 mg/dL — ABNORMAL HIGH (ref 70–99)
Potassium: 4.3 mmol/L (ref 3.5–5.1)
Sodium: 136 mmol/L (ref 135–145)

## 2023-05-10 LAB — CBC WITH DIFFERENTIAL/PLATELET
Abs Immature Granulocytes: 0.04 10*3/uL (ref 0.00–0.07)
Basophils Absolute: 0.1 10*3/uL (ref 0.0–0.1)
Basophils Relative: 1 %
Eosinophils Absolute: 0.1 10*3/uL (ref 0.0–0.5)
Eosinophils Relative: 1 %
HCT: 30.6 % — ABNORMAL LOW (ref 39.0–52.0)
Hemoglobin: 9.8 g/dL — ABNORMAL LOW (ref 13.0–17.0)
Immature Granulocytes: 0 %
Lymphocytes Relative: 7 %
Lymphs Abs: 0.7 10*3/uL (ref 0.7–4.0)
MCH: 29.2 pg (ref 26.0–34.0)
MCHC: 32 g/dL (ref 30.0–36.0)
MCV: 91.1 fL (ref 80.0–100.0)
Monocytes Absolute: 1 10*3/uL (ref 0.1–1.0)
Monocytes Relative: 10 %
Neutro Abs: 7.8 10*3/uL — ABNORMAL HIGH (ref 1.7–7.7)
Neutrophils Relative %: 81 %
Platelets: 306 10*3/uL (ref 150–400)
RBC: 3.36 MIL/uL — ABNORMAL LOW (ref 4.22–5.81)
RDW: 12.7 % (ref 11.5–15.5)
WBC: 9.7 10*3/uL (ref 4.0–10.5)
nRBC: 0 % (ref 0.0–0.2)

## 2023-05-10 LAB — MAGNESIUM: Magnesium: 1.8 mg/dL (ref 1.7–2.4)

## 2023-05-10 LAB — CK: Total CK: 58 U/L (ref 49–397)

## 2023-05-10 LAB — HEMOGLOBIN A1C
Hgb A1c MFr Bld: 7 % — ABNORMAL HIGH (ref 4.8–5.6)
Mean Plasma Glucose: 154.2 mg/dL

## 2023-05-10 LAB — C-REACTIVE PROTEIN: CRP: 23.8 mg/dL — ABNORMAL HIGH (ref <1.0)

## 2023-05-10 LAB — GLUCOSE, CAPILLARY
Glucose-Capillary: 199 mg/dL — ABNORMAL HIGH (ref 70–99)
Glucose-Capillary: 163 mg/dL — ABNORMAL HIGH (ref 70–99)

## 2023-05-10 LAB — PROCALCITONIN: Procalcitonin: 0.75 ng/mL

## 2023-05-10 LAB — SEDIMENTATION RATE: Sed Rate: 84 mm/h — ABNORMAL HIGH (ref 0–16)

## 2023-05-10 LAB — BRAIN NATRIURETIC PEPTIDE: B Natriuretic Peptide: 286.5 pg/mL — ABNORMAL HIGH (ref 0.0–100.0)

## 2023-05-10 MED ORDER — INSULIN ASPART 100 UNIT/ML IJ SOLN
0.0000 [IU] | Freq: Three times a day (TID) | INTRAMUSCULAR | Status: DC
  Administered 2023-05-10 – 2023-05-11 (×2): 2 [IU] via SUBCUTANEOUS

## 2023-05-10 NOTE — TOC Progression Note (Addendum)
Transition of Care [facility]) - Progression Note    Patient Details  Name: Christian Bailey MRN: [id number] Date of Birth: Feb 05, 1941  Transition [facility] Care [facility]) CM/SW Contact  Mearl Latin, LCSW Phone Number: 05/10/2023, 12:48 PM  Clinical Narrative:    12:48 PM-CSW spoke with patient and spouse to discuss discharge. Spouse is requesting CIR as first choice. CSW reached out to team to assess candidacy and bed openings. CSW presented SNF bed offers and ratings list as second option. They have chosen 1-[facility], 2-[facility].   1:11 PM-CSW made spouse aware that CIR cannot accept patient. She is agreeable to [facility].   2:21 PM-CSW received call from patient's son [name] [phone]. He asked clarification for why patient cannot go to CIR. CSW provided explanation as CIR coordinator is not able to contact him. He is in agreement with discharge to [facility].     Expected Discharge Plan: Skilled Nursing Facility Barriers to Discharge: Continued Medical Work up, SNF Pending bed offer  Expected Discharge Plan and Services In-house Referral: Clinical Social Work     Living arrangements for the past 2 months: Single Family Home                                       Social Determinants of Health (SDOH) Interventions SDOH Screenings   Food Insecurity: No Food Insecurity (05/07/2023)  Housing: Low Risk  (05/07/2023)  Transportation Needs: No Transportation Needs (05/07/2023)  Utilities: Not At Risk (05/07/2023)  Social Connections: Unknown (09/04/2022)   [facility] from [facility]  Tobacco Use: Medium Risk (05/07/2023)    Readmission Risk Interventions     No data to display

## 2023-05-10 NOTE — Plan of Care (Signed)

## 2023-05-10 NOTE — Plan of Care (Signed)
  Problem: Education: Goal: Knowledge of General Education information will improve Description: Including pain rating scale, medication(s)/side effects and non-pharmacologic comfort measures Outcome: Progressing   Problem: Health Behavior/Discharge Planning: Goal: Ability to manage health-related needs will improve Outcome: Progressing   Problem: Clinical Measurements: Goal: Diagnostic test results will improve Outcome: Progressing Goal: Respiratory complications will improve Outcome: Progressing   Problem: Activity: Goal: Risk for activity intolerance will decrease Outcome: Progressing   Problem: Coping: Goal: Level of anxiety will decrease Outcome: Progressing   Problem: Pain Management: Goal: General experience of comfort will improve Outcome: Progressing

## 2023-05-10 NOTE — Progress Notes (Signed)
  Progress Note    05/10/2023 8:37 AM * No surgery found *  Subjective:  says his right thigh pain is mostly better    Vitals:   05/10/23 0400 05/10/23 0805  BP: 139/66 124/68  Pulse: (!) 109 (!) 116  Resp: 18 (!) 24  Temp: 98.2 F (36.8 C) 98.6 F (37 C)  SpO2: 95% 96%    Physical Exam: General:  resting comfortably Lungs:  nonlabored Extremities:  palpable femoral pulses 2+. No tenderness to palpation of right groin, thigh, or knee  CBC    Component Value Date/Time   WBC 9.7 05/10/2023 0358   RBC 3.36 (L) 05/10/2023 0358   HGB 9.8 (L) 05/10/2023 0358   HCT 30.6 (L) 05/10/2023 0358   PLT 306 05/10/2023 0358   MCV 91.1 05/10/2023 0358   MCH 29.2 05/10/2023 0358   MCHC 32.0 05/10/2023 0358   RDW 12.7 05/10/2023 0358   LYMPHSABS 0.7 05/10/2023 0358   MONOABS 1.0 05/10/2023 0358   EOSABS 0.1 05/10/2023 0358   BASOSABS 0.1 05/10/2023 0358    BMET    Component Value Date/Time   NA 136 05/10/2023 0358   K 4.3 05/10/2023 0358   CL 103 05/10/2023 0358   CO2 24 05/10/2023 0358   GLUCOSE 155 (H) 05/10/2023 0358   BUN 36 (H) 05/10/2023 0358   CREATININE 2.43 (H) 05/10/2023 0358   CREATININE 1.33 (H) 04/10/2016 0851   CALCIUM 8.8 (L) 05/10/2023 0358   GFRNONAA 26 (L) 05/10/2023 0358   GFRAA 52 (L) 01/23/2019 0249    INR    Component Value Date/Time   INR 1.05 08/03/2018 1848     Intake/Output Summary (Last 24 hours) at 05/10/2023 0837 Last data filed at 05/10/2023 0500 Gross per 24 hour  Intake --  Output 1800 ml  Net -1800 ml      Assessment/Plan:  82 y.o. male admitted for right thigh pain and leukocytosis    -His pain in the right thigh and knee is nearly gone -He has no tenderness or erythema on exam in his right groin, thigh, or knee.  No signs of infection in his occluded right femoropopliteal bypass -Leukocytosis has resolved since admission.  WBC at 9.7 this morning -Currently no vascular intervention needed   Loel Dubonnet,  PA-C [facility] [phone] 05/10/2023 8:37 AM

## 2023-05-10 NOTE — Progress Notes (Addendum)
Inpatient Rehab Admissions Coordinator:   Per family request, patient was screened for CIR candidacy by Megan Salon, MS, CCC-SLP. At this time, Pt. does not appear to demonstrate medical necessity to justify in hospital rehabilitation/CIR. will not pursue a rehab consult for this Pt. Pt.'s AKI if felt to be 2/2 dehydration, tachycardia is baseline and controlled, and vascular is not recommending intervention for PAD.  At this time, he does not appear to require rehab in a setting where he will be seen daily by a physiatrist. However, per today's note, Dr. Thedore Mins to reach out to neurosurgery today. If he is determined to need surgical intervention, he could potentially be a CIR candidate. I will re-screen him once neurosurgery has weighed in.  Please contact me with any questions.  Megan Salon, MS, CCC-SLP Rehab Admissions Coordinator  [phone] (celll) [phone] (office)

## 2023-05-10 NOTE — Progress Notes (Signed)
PROGRESS NOTE                                                                                                                                                                                                             Patient Demographics:    Christian Bailey, is a 82 y.o. male, DOB - Mar 17, 1941, ZOX:[id number]  Outpatient Primary MD for the patient is Avva, Ravisankar, MD    LOS - 2  Admit date - 05/07/2023    Chief Complaint  Patient presents with   Fall    multiple   Leg Pain    R worse than L       Brief Narrative (HPI from H&P)   82 y.o. male with medical history significant of hypertension, hyperlipidemia, diabetes, CKD 3B, carotid artery disease, PAD, macular degeneration legally blind, patient has ongoing chronic bilateral lower extremity pain from his PAD right more than left which is gradually getting worse, he tried to get an appointment with his vascular surgeon but was not able to get in the office in a timely fashion.  He came to the hospital to get some help for his worsening bilateral lower extremity pain, he was diagnosed with dehydration and AKI and admitted to the hospital.   Subjective:   Patient in bed, appears comfortable, denies any headache, no fever, no chest pain or pressure, no shortness of breath , no abdominal pain. No focal weakness.  Lower extremity pain stable, no new complaints.   Assessment  & Plan :   AKI on CKD 3B - Due to combination of dehydration and ACE inhibitor use, hold ACE inhibitor, and hydrated renal function marginally improved and has now plateaued.  His baseline creatinine is around 1.9 but this was in 2020, I am wondering if his new baseline renal function and creatinine is close to where he is now, obtain renal ultrasound as well.   Tachycardia - chronic according to the patient his heart rate runs around 120 baseline for the last 15 to 20 years, takes low-dose beta-blocker for  it.  Continue.  No acute issues.  TSH is stable.  Free T4 mildly elevated, repeat in 4 to 6 weeks   PAD - Leg pain - > Known history of PAD status post multiple interventions including right femoropopliteal bypass and subsequent aortobifem bypass.  Venous ultrasound unremarkable, ABIs  noted with unchanged but moderate to severe bilateral lower extremity disease. Continue Plavix and statin for secondary prevention.  Pain control.  He has been seen by vascular surgery Case discussed with them, ABI is unchanged supportive care, also noted MRI of his L-spine will discuss with Dr. Wynetta Emery neurosurgeon on-call however MRI largely unchanged and pain has improved.   Hypertension - Holding lisinopril as above,  Continue metoprolol   Hyperlipidemia - Continue home rosuvastatin    Carotid artery disease > Known asymptomatic right carotid stenosis at 60-79%. - Continue home Plavix, rosuvastatin   Diabetes - SSI  CBG (last 3)  No results for input(s): "GLUCAP" in the last 72 hours.      Condition - Extremely Guarded  Family Communication  : Wife updated on 05/08/2023  Code Status : Full code  Consults  : VVS  PUD Prophylaxis :  PPI   Procedures  :     MRI L-spine.  Bilateral lower extremity ABIs and lower extremity bypass graft evaluation.    Right lower extremity venous duplex.  No DVT.      Disposition Plan  :    Status is: Observation  DVT Prophylaxis  :    heparin injection 5,000 Units Start: 05/07/23 2200    Lab Results  Component Value Date   PLT 306 05/10/2023    Diet :  Diet Order             Diet regular Fluid consistency: Thin  Diet effective now                    Inpatient Medications  Scheduled Meds:  clopidogrel  75 mg Oral Daily   feeding supplement  237 mL Oral BID BM   heparin  5,000 Units Subcutaneous Q8H   insulin aspart  0-9 Units Subcutaneous TID WC   metoprolol tartrate  50 mg Oral BID   mirabegron ER  50 mg Oral Daily   pantoprazole  40 mg  Oral BID   rosuvastatin  20 mg Oral QPM   sodium chloride flush  3 mL Intravenous Q12H   Continuous Infusions:   PRN Meds:.acetaminophen **OR** acetaminophen, diltiazem, polyethylene glycol  Antibiotics  :    Anti-infectives (From admission, onward)    None         Objective:   Vitals:   05/09/23 1945 05/09/23 2310 05/10/23 0400 05/10/23 0805  BP: (!) 118/51 (!) 141/58 139/66 124/68  Pulse: 100 93 (!) 109 (!) 116  Resp: 19  18 (!) 24  Temp: 98.2 F (36.8 C) 98.3 F (36.8 C) 98.2 F (36.8 C) 98.6 F (37 C)  TempSrc: Oral Oral Oral Oral  SpO2: 90% (!) 82% 95% 96%  Weight:      Height:        Wt Readings from Last 3 Encounters:  05/07/23 71.7 kg  11/02/22 66.7 kg  07/07/22 66.4 kg     Intake/Output Summary (Last 24 hours) at 05/10/2023 0940 Last data filed at 05/10/2023 0500 Gross per 24 hour  Intake --  Output 1800 ml  Net -1800 ml     Physical Exam  Awake Alert, No new F.N deficits, Normal affect [place].AT,PERRAL Supple Neck, No JVD,   Symmetrical Chest wall movement, Good air movement bilaterally, CTAB RRR,No Gallops,Rubs or new Murmurs,  +ve B.Sounds, Abd Soft, No tenderness,   No Cyanosis, Clubbing or edema        Data Review:    Recent Labs  Lab 05/07/23 1058 05/08/23  3086 05/09/23 0454 05/10/23 0358  WBC 18.7* 12.7* 9.8 9.7  HGB 10.6* 9.2* 9.2* 9.8*  HCT 31.8* 28.1* 28.6* 30.6*  PLT 328 260 277 306  MCV 91.4 91.5 91.7 91.1  MCH 30.5 30.0 29.5 29.2  MCHC 33.3 32.7 32.2 32.0  RDW 13.2 13.1 12.9 12.7  LYMPHSABS 1.1  --  0.6* 0.7  MONOABS 1.3*  --  0.8 1.0  EOSABS 0.0  --  0.1 0.1  BASOSABS 0.1  --  0.0 0.1    Recent Labs  Lab 05/07/23 1058 05/07/23 1231 05/08/23 0418 05/09/23 0454 05/10/23 0358  NA 134*  --  138 138 136  K 4.3  --  4.4 4.1 4.3  CL 98  --  105 105 103  CO2 26  --  24 24 24   ANIONGAP 10  --  9 9 9   GLUCOSE 189*  --  135* 175* 155*  BUN 46*  --  33* 34* 36*  CREATININE 2.67*  --  2.20* 2.38* 2.43*  AST  13*  --  15  --   --   ALT 7  --  13  --   --   ALKPHOS 76  --  70  --   --   BILITOT 0.7  --  0.9  --   --   ALBUMIN 4.4  --  2.8*  --   --   CRP  --   --   --  23.6* 23.8*  PROCALCITON  --   --   --  0.85 0.75  LATICACIDVEN  --  1.4  --   --   --   TSH  --   --  1.279 1.150  --   BNP  --   --   --  361.6* 286.5*  MG 1.6*  --  1.9 1.9 1.8  CALCIUM 9.6  --  8.7* 8.7* 8.8*      Recent Labs  Lab 05/07/23 1058 05/07/23 1231 05/08/23 0418 05/09/23 0454 05/10/23 0358  CRP  --   --   --  23.6* 23.8*  PROCALCITON  --   --   --  0.85 0.75  LATICACIDVEN  --  1.4  --   --   --   TSH  --   --  1.279 1.150  --   BNP  --   --   --  361.6* 286.5*  MG 1.6*  --  1.9 1.9 1.8  CALCIUM 9.6  --  8.7* 8.7* 8.8*    --------------------------------------------------------------------------------------------------------------- Lab Results  Component Value Date   TRIG 87 07/25/2018    Lab Results  Component Value Date   HGBA1C 5.9 (H) 01/20/2019     Micro Results No results found for this or any previous visit (from the past 240 hour(s)).  Radiology Reports DG Chest Port 1 View  Result Date: 05/10/2023 CLINICAL DATA:  82 year old male with shortness of breath. EXAM: PORTABLE CHEST 1 VIEW COMPARISON:  Portable chest 05/08/2023 and earlier. FINDINGS: Portable AP upright view at 0656 hours. Right shoulder total arthroplasty redemonstrated. Mildly lower lung volumes. Mediastinal contours remain normal. Visualized tracheal air column is within normal limits. Vague increased interstitial opacity in the left lower lung since 05/07/2023. Otherwise when allowing for portable technique the lungs are clear. IMPRESSION: Lower lung volumes and vague increased opacity in the left lower lung since 05/07/2023. This may be atelectasis but developing infection not excluded. No other acute cardiopulmonary abnormality. Electronically Signed   By: Althea Grimmer.D.  On: 05/10/2023 07:34   MR LUMBAR SPINE WO  CONTRAST  Result Date: 05/09/2023 CLINICAL DATA:  82 year old male with persistent low back pain. EXAM: MRI LUMBAR SPINE WITHOUT CONTRAST TECHNIQUE: Multiplanar, multisequence MR imaging of the lumbar spine was performed. No intravenous contrast was administered. COMPARISON:  Lumbar MRI 03/15/2017. CT Abdomen and Pelvis 01/18/2019. FINDINGS: Segmentation: Normal on the comparison CT, the same numbering system used on the previous MRI. Alignment:  Stable lumbar lordosis. Vertebrae: Better demonstrated by CT is widespread interbody ankylosis from the lower thoracic spine through the L4 level on the 2020 CT. Normal background bone marrow signal. Maintained vertebral height. No marrow edema or evidence of acute osseous abnormality. Intact visible sacrum and SI joints. Conus medullaris and cauda equina: Conus extends to the L1-L2 level. No lower spinal cord or conus signal abnormality. Normal cauda equina nerve roots. Paraspinal and other soft tissues: Negative. Disc levels: Grossly negative visible T10-T11 and T11-T12 levels. T12-L1: Chronic left paracentral disc or disc osteophyte superimposed on chronic ankylosis of this level (series 5, image 4). Chronic spinal stenosis here with mild mass effect just above the conus, stable from 2018. No conus signal abnormality. L1-L2:  Chronic ankylosis, negative. L2-L3: Chronic ankylosis. Mild to moderate facet and ligament flavum hypertrophy. Chronic right L2 foraminal stenosis is stable. L3-L4:  Chronic ankylosis.  No significant stenosis. L4-L5: Circumferential disc bulge with moderate facet and ligament flavum hypertrophy is chronic. Moderate spinal stenosis does appear mildly progressed since 2018 on series 5, image 30. Up to moderate lateral recess stenosis greater on the right (right L5 nerve level). Moderate left and moderate to severe right L4 neural foraminal stenosis have not significantly changed. L5-S1: Mild disc bulging. Moderate ligament flavum and moderate to  severe facet hypertrophy. No spinal or convincing lateral recess stenosis. Moderate bilateral L5 foraminal stenosis is greater on the left and stable. IMPRESSION: 1. No acute osseous abnormality. Chronic spinal ankylosis from the lower thoracic spine through L4. 2. Degeneration at the unfused L4-L5 level has progressed since a 2018 MRI. Increased and moderate multifactorial spinal and lateral recess stenosis at that level (L5 nerve levels), while moderate to severe L4 level neural foraminal stenosis does not appear significantly changed. 3. Lesser progression of degeneration at the unfused L5-S1 level. Moderate bilateral foraminal stenosis there is stable. 4. Chronic left paracentral disc osteophyte complex at T12-L1 resulting in mild spinal stenosis there and mild chronic spinal cord mass effect but no cord signal abnormality. Electronically Signed   By: Odessa Fleming M.D.   On: 05/09/2023 11:48   VAS [place] ABI WITH/WO TBI  Result Date: 05/09/2023  LOWER EXTREMITY DOPPLER STUDY Patient Name:  Christian Bailey  Date of Exam:   05/08/2023 Medical Rec #: [id number]     Accession #:    [id number] Date of Birth: 18-Sep-1940     Patient Gender: M Patient Age:   28 years Exam Location:  [facility] Procedure:      VAS [place] ABI WITH/WO TBI Referring Phys: Lyn Hollingshead MELVIN --------------------------------------------------------------------------------  Indications: Peripheral artery disease. Ongoing claudication. Right knee and              thigh pain. High Risk         Hypertension, hyperlipidemia, Diabetes, past history of Factors:          smoking. Other Factors: CKD III, sinus tachycardia, Right bundle branch block with left                anterior fasicular block, asymptomatic  right ICA stenosis,                60-79%.  Vascular Interventions: Left common iliac stenting 20 years ago, left external                         iliac stent and right SFA stent 04/2010, 07/06/2018                         AO-Bifem BPG, 09/08/2017 Redo  right femoral                         endarterectomy with angioplasty; 09/18/2016 Redo right                         femoral-popliteal BPG Right femoral-popliteal BPG. Limitations: Today's exam was limited due to Tachycardia and arrythmia. Comparison Study: Prior ABI done 10/16/22 Performing Technologist: Sherren Kerns RVS  Examination Guidelines: A complete evaluation includes at minimum, Doppler waveform signals and systolic blood pressure reading at the level of bilateral brachial, anterior tibial, and posterior tibial arteries, when vessel segments are accessible. Bilateral testing is considered an integral part of a complete examination. Photoelectric Plethysmograph (PPG) waveforms and toe systolic pressure readings are included as required and additional duplex testing as needed. Limited examinations for reoccurring indications may be performed as noted.  ABI Findings: +---------+------------------+-----+----------+--------+ Right    Rt Pressure (mmHg)IndexWaveform  Comment  +---------+------------------+-----+----------+--------+ Brachial 136                    triphasic          +---------+------------------+-----+----------+--------+ PTA      93                0.66 monophasic         +---------+------------------+-----+----------+--------+ DP       75                0.54 monophasic         +---------+------------------+-----+----------+--------+ Great Toe39                0.28 Abnormal           +---------+------------------+-----+----------+--------+ +---------+------------------+-----+----------+-------+ Left     Lt Pressure (mmHg)IndexWaveform  Comment +---------+------------------+-----+----------+-------+ Brachial 140                    triphasic         +---------+------------------+-----+----------+-------+ PTA      75                0.54 monophasic        +---------+------------------+-----+----------+-------+ DP       86                0.61  monophasic        +---------+------------------+-----+----------+-------+ Great Toe42                0.30 Abnormal          +---------+------------------+-----+----------+-------+ +-------+-----------+-----------+------------+------------+ ABI/TBIToday's ABIToday's TBIPrevious ABIPrevious TBI +-------+-----------+-----------+------------+------------+ Right  0.66       0.28       0.59        0.38         +-------+-----------+-----------+------------+------------+ Left   0.61       0.30       0.69        0.46         +-------+-----------+-----------+------------+------------+  Bilateral ABIs appear essentially unchanged compared to prior study on 10/16/22. Right TBIs appear decreased since prior study done 10/16/22 and left TBIs appear slightly increased since prior study done 10/16/22.  Summary: Right: Resting right ankle-brachial index indicates moderate right lower extremity arterial disease. The right toe-brachial index is abnormal. Left: Resting left ankle-brachial index indicates moderate left lower extremity arterial disease. The left toe-brachial index is abnormal. *See table(s) above for measurements and observations.  Electronically signed by Carolynn Sayers on 05/09/2023 at 3:52:11 AM.    Final    VAS [place] LOWER EXTREMITY BYPASS GRAFT DUPL  Result Date: 05/09/2023 LOWER EXTREMITY ARTERIAL DUPLEX STUDY Patient Name:  Christian Bailey  Date of Exam:   05/08/2023 Medical Rec #: [id number]     Accession #:    [id number] Date of Birth: [birth year]     Patient Gender: M Patient Age:   89 years Exam Location:  [facility] Procedure:      VAS [place] LOWER EXTREMITY BYPASS GRAFT DUPLEX Referring Phys: Carolynn Sayers --------------------------------------------------------------------------------  Indications: Peripheral artery disease, and Right thigh and knee pain, ongoing              claudication. High Risk Factors: Hypertension, hyperlipidemia, Diabetes, past history of                     smoking. Other Factors: CKD III, sinus tachycardia, Right bundle branch block with                left anterior fascicular block, asymptomatic right ICA                stenosis,60-79%.  Vascular Interventions: Left common iliac stenting 20 years ago, left                         external iliac stent and right SFA stent 04/2010,                         07/06/2018                         AO-Bi fem BPG, 09/08/2017 Redo right femoral                         endarterectomy with angioplasty; 09/18/2016 Redo right                         femoral-popliteal BPG Right femoral-popliteal BPG. Current ABI:            R: 0.66/0.28 L;0.61/0.30 Limitations: Difficult to visualize vasculature/graft Comparison Study: Prior Right Bypass duplex done 10/02/2019 indicated occluded                   proximal portion of the fem-pop BPG with minimal retrograde                   to-and-fro flow observed in the mid an distal segments of the                   graft. Performing Technologist: Sherren Kerns RVS  Examination Guidelines: A complete evaluation includes B-mode imaging, spectral Doppler, color Doppler, and power Doppler as needed of all accessible portions of each vessel. Bilateral testing is considered an integral part of a complete examination. Limited examinations for reoccurring indications may be performed as noted.  +----------+--------+-----+--------+----------+-----------------+ RIGHT  PSV cm/sRatioStenosisWaveform  Comments          +----------+--------+-----+--------+----------+-----------------+ DFA       98                   biphasic                    +----------+--------+-----+--------+----------+-----------------+ SFA Prox               occluded                            +----------+--------+-----+--------+----------+-----------------+ SFA Mid                                  Collaterals noted +----------+--------+-----+--------+----------+-----------------+ SFA Distal                                Collaterals noted +----------+--------+-----+--------+----------+-----------------+ POP Prox  39                   monophasic                  +----------+--------+-----+--------+----------+-----------------+ ATA Mid   31                   monophasic                  +----------+--------+-----+--------+----------+-----------------+ PTA Prox  26                   monophasic                  +----------+--------+-----+--------+----------+-----------------+ PTA Mid   20                   monophasic                  +----------+--------+-----+--------+----------+-----------------+  Right Graft #1: Fem-Pop BPG +------------------+--------+--------+----------+-----------------------+                   PSV cm/sStenosisWaveform  Comments                +------------------+--------+--------+----------+-----------------------+ Inflow            298             monophasic                        +------------------+--------+--------+----------+-----------------------+ Prox Anastomosis  489             monophasic                        +------------------+--------+--------+----------+-----------------------+ Proximal Graft            occluded                                  +------------------+--------+--------+----------+-----------------------+ Mid Graft                                   minimal retrograde flow +------------------+--------+--------+----------+-----------------------+ Distal Graft  not visualized          +------------------+--------+--------+----------+-----------------------+ Distal Anastomosis                          not visualized          +------------------+--------+--------+----------+-----------------------+ Outflow           39              monophasic                        +------------------+--------+--------+----------+-----------------------+   Summary: Right: Occluded proximal  graft with minimal retrograde flow noted in the mid portion as noted on prior study in 2021. Collateral flow noted mid to distal thigh.  See table(s) above for measurements and observations. Electronically signed by Carolynn Sayers on 05/09/2023 at 3:52:03 AM.    Final    DG Chest Port 1 View  Result Date: 05/08/2023 CLINICAL DATA:  Shortness of breath EXAM: PORTABLE CHEST 1 VIEW COMPARISON:  Chest x-ray 05/07/2023 FINDINGS: There is a nodular density overlying the left lower lung measuring approximally 13 mm, indeterminate, possibly a nipple shadow. The lungs are otherwise clear. There is no pleural effusion or pneumothorax. The cardiomediastinal silhouette is within normal limits. Right shoulder arthroplasty is present. No acute fractures. IMPRESSION: 13 mm nodular density overlying the left lower lung, indeterminate. Recommend repeat chest x-ray with nipple markers or follow-up chest CT. Electronically Signed   By: Darliss Cheney M.D.   On: 05/08/2023 20:33   DG Chest Portable 1 View  Result Date: 05/07/2023 CLINICAL DATA:  Weakness, multiple falls. EXAM: PORTABLE CHEST 1 VIEW COMPARISON:  January 18, 2019. FINDINGS: The heart size and mediastinal contours are within normal limits. Both lungs are clear. Status post right shoulder arthroplasty. IMPRESSION: No active disease. Electronically Signed   By: Lupita Raider M.D.   On: 05/07/2023 16:40   DG Knee Complete 4 Views Right  Result Date: 05/07/2023 CLINICAL DATA:  82 year old male with right hip and knee pain. No known injury. EXAM: RIGHT KNEE - COMPLETE 4+ VIEW COMPARISON:  Right hip series today. FINDINGS: Femoral artery vascular stents continue to the distal 3rd femoral shaft. Surgical clips along the medial leg. Bone mineralization is within normal limits for age. Maintained alignment at the right knee. No joint effusion on the cross-table lateral view. Normal joint spaces for age. Patella intact. No acute osseous abnormality identified. IMPRESSION:  1. No acute osseous abnormality or age advanced degeneration at the right knee. 2. Extensive right femoral artery vascular stents. Electronically Signed   By: Odessa Fleming M.D.   On: 05/07/2023 12:54   DG Hip Unilat W or Wo Pelvis 2-3 Views Right  Result Date: 05/07/2023 CLINICAL DATA:  82 year old male with right hip and knee pain. No known injury. EXAM: DG HIP (WITH OR WITHOUT PELVIS) 2-3V RIGHT COMPARISON:  CT Abdomen and Pelvis 01/18/2019. FINDINGS: Chronic iliac artery vascular stents. Bone mineralization is within normal limits for age. Pelvis appears intact. Femoral heads are normally located. Chronic bilateral acetabular degenerative spurring. Grossly intact proximal left femur. Proximal right femur appears intact. Extensive right femoral artery vascular stents. Multiple right leg surgical clips. No acute osseous abnormality identified. Negative visible bowel gas. IMPRESSION: 1. No acute osseous abnormality identified about the pelvis or right hip. 2. Chronic iliac and right femoral artery vascular stents. Electronically Signed   By: Odessa Fleming M.D.   On: 05/07/2023 12:53   US  Venous Img Lower Right (DVT Study)  Result Date: 05/07/2023 CLINICAL DATA:  leg pain. History. The with reported lower extremity bypass EXAM: RIGHT LOWER EXTREMITY VENOUS DOPPLER ULTRASOUND TECHNIQUE: Gray-scale sonography with compression, as well as color and duplex ultrasound, were performed to evaluate the deep venous system(s) from the level of the common femoral vein through the popliteal and proximal calf veins. COMPARISON:  RIGHT lower extremity XRs, 05/07/2023 FINDINGS: VENOUS Normal compressibility of the common femoral, superficial femoral, and popliteal veins, as well as the visualized calf veins. Visualized portions of profunda femoral vein and great saphenous vein unremarkable. No filling defects to suggest DVT on grayscale or color Doppler imaging. Doppler waveforms show normal direction of venous flow, normal  respiratory plasticity and response to augmentation. Limited views of the contralateral common femoral vein are unremarkable. OTHER No evidence of superficial thrombophlebitis or abnormal fluid collection. Limitations: none IMPRESSION: No evidence of femoropopliteal DVT or superficial thrombophlebitis within the RIGHT lower extremity. Roanna Banning, MD [facility] Radiology Electronically Signed   By: Roanna Banning M.D.   On: 05/07/2023 12:02      Signature  -   Susa Raring M.D on 05/10/2023 at 9:40 AM   -  To page go to [email or web address]

## 2023-05-10 NOTE — Progress Notes (Addendum)
Physical Therapy Treatment Patient Details Name: Christian Bailey MRN: [id number] DOB: 1940/11/08 Today's Date: 05/10/2023   History of Present Illness The pt is an 82 yo male presenting 11/8 with reports of multiple falls at home and RLE pain. Work up revealed AKI on CKD III and tachycardia. No acute DVT. PMH includes: anemia, DM II, HLD, HTN, PAD, and RBBB.    PT Comments  Pt progressing slowly towards his physical therapy goals and is motivated to participate. Pt able to participate in warm up BLE ROM exercises. Requiring moderate assist for transfers and ambulating 50 ft with a walker. Patient will benefit from continued inpatient follow up therapy, <3 hours/day    If plan is discharge home, recommend the following: A lot of help with bathing/dressing/bathroom;Direct supervision/assist for medications management;Direct supervision/assist for financial management;Assist for transportation;Help with stairs or ramp for entrance;A lot of help with walking and/or transfers   Can travel by private vehicle     Yes  Equipment Recommendations  BSC/3in1    Recommendations for Other Services       Precautions / Restrictions Precautions Precautions: Fall Precaution Comments: 2 falls in last 2 months Restrictions Weight Bearing Restrictions: No     Mobility  Bed Mobility Overal bed mobility: Needs Assistance Bed Mobility: Supine to Sit     Supine to sit: Mod assist, HOB elevated, Used rails     General bed mobility comments: Decreased functional use of RUE, assist at trunk to elevate. Exiting towards left side of bed    Transfers Overall transfer level: Needs assistance Equipment used: Rolling walker (2 wheels) Transfers: Sit to/from Stand Sit to Stand: +2 physical assistance, Mod assist           General transfer comment: ModA +1-2 to stand from edge of bed and BSC.    Ambulation/Gait Ambulation/Gait assistance: Min assist Gait Distance (Feet): 50 Feet Assistive device:  Rolling walker (2 wheels) Gait Pattern/deviations: Step-through pattern, Narrow base of support, Trunk flexed, Ataxic Gait velocity: decreased Gait velocity interpretation: <1.31 ft/sec, indicative of household ambulator   General Gait Details: MinA for balance, decreased R foot clearance   Stairs             Wheelchair Mobility     Tilt Bed    Modified Rankin (Stroke Patients Only)       Balance Overall balance assessment: Needs assistance Sitting-balance support: No upper extremity supported, Feet supported Sitting balance-Leahy Scale: Fair     Standing balance support: Bilateral upper extremity supported, During functional activity, Reliant on assistive device for balance Standing balance-Leahy Scale: Poor Standing balance comment: dependent on BUE support and min-modA for static standing. mod-maxA with BUE support for gait                            Cognition Arousal: Alert Behavior During Therapy: WFL for tasks assessed/performed Overall Cognitive Status: Impaired/Different from baseline Area of Impairment: Attention, Memory, Following commands, Safety/judgement, Awareness, Problem solving                   Current Attention Level: Sustained Memory: Decreased short-term memory Following Commands: Follows one step commands consistently, Follows one step commands with increased time Safety/Judgement: Decreased awareness of safety, Decreased awareness of deficits Awareness: Emergent Problem Solving: Slow processing, Decreased initiation, Difficulty sequencing, Requires verbal cues General Comments: Improved recall this session        Exercises General Exercises - Lower Extremity Heel Slides: Both, 10 reps,  Supine Hip ABduction/ADduction: Both, 10 reps, Supine Straight Leg Raises: Both, 5 reps, Supine    General Comments        Pertinent Vitals/Pain Pain Assessment Pain Assessment: Faces Faces Pain Scale: Hurts even more Pain  Location: L knee Pain Descriptors / Indicators: Discomfort, Grimacing, Guarding Pain Intervention(s): Limited activity within patient's tolerance, Monitored during session    Home Living                          Prior Function            PT Goals (current goals can now be found in the care plan section) Acute Rehab PT Goals Patient Stated Goal: to return home PT Goal Formulation: With patient Time For Goal Achievement: 05/22/23 Potential to Achieve Goals: Good Progress towards PT goals: Progressing toward goals    Frequency    Min 1X/week      PT Plan      Co-evaluation              AM-PAC PT "6 Clicks" Mobility   Outcome Measure  Help needed turning from your back to your side while in a flat bed without using bedrails?: A Lot Help needed moving from lying on your back to sitting on the side of a flat bed without using bedrails?: A Lot Help needed moving to and from a bed to a chair (including a wheelchair)?: A Lot Help needed standing up from a chair using your arms (e.g., wheelchair or bedside chair)?: A Lot Help needed to walk in hospital room?: A Little Help needed climbing 3-5 steps with a railing? : A Lot 6 Click Score: 13    End of Session Equipment Utilized During Treatment: Gait belt Activity Tolerance: Patient tolerated treatment well Patient left: in chair;with call bell/phone within reach;with chair alarm set Nurse Communication: Mobility status PT Visit Diagnosis: Unsteadiness on feet (R26.81);Other abnormalities of gait and mobility (R26.89);Muscle weakness (generalized) (M62.81);Repeated falls (R29.6);Pain;Ataxic gait (R26.0) Pain - Right/Left: Right Pain - part of body: Leg     Time: 1610-9604 PT Time Calculation (min) (ACUTE ONLY): 30 min  Charges:    $Therapeutic Activity: 23-37 mins PT General Charges $$ ACUTE PT VISIT: 1 Visit                     Lillia Pauls, PT, DPT Acute Rehabilitation Services Office  [phone]    Norval Morton 05/10/2023, 4:48 PM

## 2023-05-11 DIAGNOSIS — N184 Chronic kidney disease, stage 4 (severe): Secondary | ICD-10-CM | POA: Diagnosis not present

## 2023-05-11 DIAGNOSIS — I129 Hypertensive chronic kidney disease with stage 1 through stage 4 chronic kidney disease, or unspecified chronic kidney disease: Secondary | ICD-10-CM | POA: Diagnosis not present

## 2023-05-11 DIAGNOSIS — N179 Acute kidney failure, unspecified: Secondary | ICD-10-CM | POA: Diagnosis not present

## 2023-05-11 DIAGNOSIS — E1122 Type 2 diabetes mellitus with diabetic chronic kidney disease: Secondary | ICD-10-CM | POA: Diagnosis not present

## 2023-05-11 DIAGNOSIS — I779 Disorder of arteries and arterioles, unspecified: Secondary | ICD-10-CM | POA: Diagnosis not present

## 2023-05-11 DIAGNOSIS — R278 Other lack of coordination: Secondary | ICD-10-CM | POA: Diagnosis not present

## 2023-05-11 DIAGNOSIS — I739 Peripheral vascular disease, unspecified: Secondary | ICD-10-CM | POA: Diagnosis not present

## 2023-05-11 DIAGNOSIS — D631 Anemia in chronic kidney disease: Secondary | ICD-10-CM | POA: Diagnosis not present

## 2023-05-11 DIAGNOSIS — E785 Hyperlipidemia, unspecified: Secondary | ICD-10-CM | POA: Diagnosis not present

## 2023-05-11 DIAGNOSIS — N189 Chronic kidney disease, unspecified: Secondary | ICD-10-CM | POA: Diagnosis not present

## 2023-05-11 DIAGNOSIS — I10 Essential (primary) hypertension: Secondary | ICD-10-CM | POA: Diagnosis not present

## 2023-05-11 DIAGNOSIS — R1312 Dysphagia, oropharyngeal phase: Secondary | ICD-10-CM | POA: Diagnosis not present

## 2023-05-11 DIAGNOSIS — R0989 Other specified symptoms and signs involving the circulatory and respiratory systems: Secondary | ICD-10-CM | POA: Diagnosis not present

## 2023-05-11 DIAGNOSIS — R0902 Hypoxemia: Secondary | ICD-10-CM | POA: Diagnosis not present

## 2023-05-11 DIAGNOSIS — Z7401 Bed confinement status: Secondary | ICD-10-CM | POA: Diagnosis not present

## 2023-05-11 DIAGNOSIS — J189 Pneumonia, unspecified organism: Secondary | ICD-10-CM | POA: Diagnosis not present

## 2023-05-11 DIAGNOSIS — N1832 Chronic kidney disease, stage 3b: Secondary | ICD-10-CM | POA: Diagnosis not present

## 2023-05-11 DIAGNOSIS — Q6102 Congenital multiple renal cysts: Secondary | ICD-10-CM | POA: Diagnosis not present

## 2023-05-11 DIAGNOSIS — R262 Difficulty in walking, not elsewhere classified: Secondary | ICD-10-CM | POA: Diagnosis not present

## 2023-05-11 DIAGNOSIS — R000 Tachycardia, unspecified: Secondary | ICD-10-CM | POA: Diagnosis not present

## 2023-05-11 DIAGNOSIS — I6521 Occlusion and stenosis of right carotid artery: Secondary | ICD-10-CM | POA: Diagnosis not present

## 2023-05-11 DIAGNOSIS — E875 Hyperkalemia: Secondary | ICD-10-CM | POA: Diagnosis not present

## 2023-05-11 LAB — MAGNESIUM: Magnesium: 1.8 mg/dL (ref 1.7–2.4)

## 2023-05-11 LAB — BASIC METABOLIC PANEL
Anion gap: 11 (ref 5–15)
BUN: 41 mg/dL — ABNORMAL HIGH (ref 8–23)
CO2: 25 mmol/L (ref 22–32)
Calcium: 8.8 mg/dL — ABNORMAL LOW (ref 8.9–10.3)
Chloride: 100 mmol/L (ref 98–111)
Creatinine, Ser: 2.72 mg/dL — ABNORMAL HIGH (ref 0.61–1.24)
GFR, Estimated: 23 mL/min — ABNORMAL LOW (ref >60)
Glucose, Bld: 169 mg/dL — ABNORMAL HIGH (ref 70–99)
Potassium: 4.3 mmol/L (ref 3.5–5.1)
Sodium: 136 mmol/L (ref 135–145)

## 2023-05-11 LAB — CBC WITH DIFFERENTIAL/PLATELET
Abs Immature Granulocytes: 0.06 10*3/uL (ref 0.00–0.07)
Basophils Absolute: 0.1 10*3/uL (ref 0.0–0.1)
Basophils Relative: 1 %
Eosinophils Absolute: 0.1 10*3/uL (ref 0.0–0.5)
Eosinophils Relative: 1 %
HCT: 27.7 % — ABNORMAL LOW (ref 39.0–52.0)
Hemoglobin: 9.1 g/dL — ABNORMAL LOW (ref 13.0–17.0)
Immature Granulocytes: 1 %
Lymphocytes Relative: 9 %
Lymphs Abs: 0.9 10*3/uL (ref 0.7–4.0)
MCH: 29.9 pg (ref 26.0–34.0)
MCHC: 32.9 g/dL (ref 30.0–36.0)
MCV: 91.1 fL (ref 80.0–100.0)
Monocytes Absolute: 1 10*3/uL (ref 0.1–1.0)
Monocytes Relative: 10 %
Neutro Abs: 7.8 10*3/uL — ABNORMAL HIGH (ref 1.7–7.7)
Neutrophils Relative %: 78 %
Platelets: 289 10*3/uL (ref 150–400)
RBC: 3.04 MIL/uL — ABNORMAL LOW (ref 4.22–5.81)
RDW: 12.7 % (ref 11.5–15.5)
WBC: 10 10*3/uL (ref 4.0–10.5)
nRBC: 0 % (ref 0.0–0.2)

## 2023-05-11 LAB — C-REACTIVE PROTEIN: CRP: 26.7 mg/dL — ABNORMAL HIGH (ref <1.0)

## 2023-05-11 LAB — PROCALCITONIN: Procalcitonin: 0.81 ng/mL

## 2023-05-11 LAB — GLUCOSE, CAPILLARY: Glucose-Capillary: 198 mg/dL — ABNORMAL HIGH (ref 70–99)

## 2023-05-11 LAB — BRAIN NATRIURETIC PEPTIDE: B Natriuretic Peptide: 338.1 pg/mL — ABNORMAL HIGH (ref 0.0–100.0)

## 2023-05-11 MED ORDER — AMLODIPINE BESYLATE 10 MG PO TABS
10.0000 mg | ORAL_TABLET | Freq: Every day | ORAL | Status: DC
  Administered 2023-05-11: 10 mg via ORAL
  Filled 2023-05-11: qty 1

## 2023-05-11 MED ORDER — TRAMADOL HCL 50 MG PO TABS: 50.0000 mg | ORAL_TABLET | Freq: Two times a day (BID) | ORAL | 0 refills | Status: DC | PRN

## 2023-05-11 MED ORDER — LACTATED RINGERS IV SOLN: INTRAVENOUS | Status: DC

## 2023-05-11 MED ORDER — POLYETHYLENE GLYCOL 3350 17 G PO PACK: 17.0000 g | PACK | Freq: Every day | ORAL | Status: DC | PRN

## 2023-05-11 MED ORDER — METOPROLOL TARTRATE 50 MG PO TABS: 50.0000 mg | ORAL_TABLET | Freq: Two times a day (BID) | ORAL | Status: DC

## 2023-05-11 MED ORDER — AMLODIPINE BESYLATE 10 MG PO TABS: 10.0000 mg | ORAL_TABLET | Freq: Every day | ORAL | Status: DC

## 2023-05-11 NOTE — Care Management Important Message (Signed)
Important Message  Patient Details  Name: Christian Bailey MRN: [id number] Date of Birth: Nov 29, 1940   Important Message Given:  Yes - Medicare IM   Patient  discharged and the IM will be mailed to the patient home address,  Christian Bailey 05/11/2023, 1:52 PM

## 2023-05-11 NOTE — Discharge Instructions (Signed)
Follow with Primary MD Avva, Ravisankar, MD in 7 days   Get CBC, CMP, 2 view Chest X ray -  checked next visit with your primary MD or SNF MD   Activity: As tolerated with Full fall precautions use walker/cane & assistance as needed  Disposition SNF  Diet: Heart Healthy    Special Instructions: If you have smoked or chewed Tobacco  in the last 2 yrs please stop smoking, stop any regular Alcohol  and or any Recreational drug use.  On your next visit with your primary care physician please Get Medicines reviewed and adjusted.  Please request your Prim.MD to go over all Hospital Tests and Procedure/Radiological results at the follow up, please get all Hospital records sent to your Prim MD by signing hospital release before you go home.  If you experience worsening of your admission symptoms, develop shortness of breath, life threatening emergency, suicidal or homicidal thoughts you must seek medical attention immediately by calling 911 or calling your MD immediately  if symptoms less severe.  You Must read complete instructions/literature along with all the possible adverse reactions/side effects for all the Medicines you take and that have been prescribed to you. Take any new Medicines after you have completely understood and accpet all the possible adverse reactions/side effects.   Do not drive when taking Pain medications.  Do not take more than prescribed Pain, Sleep and Anxiety Medications

## 2023-05-11 NOTE — Discharge Summary (Signed)
Christian Bailey GNF:[id number] DOB: 14-Oct-1940 DOA: 05/07/2023  PCP: Chilton Greathouse, MD  Admit date: 05/07/2023  Discharge date: 05/11/2023  Admitted From: Home   Disposition:  SNF   Recommendations for Outpatient Follow-up:   Follow up with PCP in 1-2 weeks  PCP Please obtain BMP/CBC, 2 view CXR in 1week,  (see Discharge instructions)   PCP Please follow up on the following pending results: Close outpatient follow-up with nephrology within a week, he has a appointment with his nephrologist on 05/16/2023, also needs one-time outpatient rheumatology follow-up in 2 to 3 weeks.  Please repeat TSH and free T4 in 2 to 3 weeks.  Check BMP and magnesium in 5 to 7 days.   Home Health: None   Equipment/Devices: None  Consultations: None  Discharge Condition: Stable    CODE STATUS: Full    Diet Recommendation: Heart Healthy     Chief Complaint  Patient presents with   Fall    multiple   Leg Pain    R worse than L     Brief history of present illness from the day of admission and additional interim summary     82 y.o. male with medical history significant of hypertension, hyperlipidemia, diabetes, CKD 3B, carotid artery disease, PAD, macular degeneration legally blind, patient has ongoing chronic bilateral lower extremity pain from his PAD right more than left which is gradually getting worse, he tried to get an appointment with his vascular surgeon but was not able to get in the office in a timely fashion.  He came to the hospital to get some help for his worsening bilateral lower extremity pain, he was diagnosed with dehydration and AKI and admitted to the hospital.                                                                  Hospital Course   AKI on CKD 3B - Due to combination of dehydration and ACE inhibitor  use, hold ACE inhibitor, and hydrated renal function marginally improved and has now plateaued.  His baseline creatinine is around 1.9 but this was in 2020, checked with his nephrologist office last creatinine in their office is 2.2 few months ago, renal ultrasound nonacute, good urine output.  I am wondering if his renal function has now progressed with a creatinine of 2.7 being his baseline, continue to withhold ACE inhibitor, he has an upcoming appointment with his nephrologist Dr. Ronalee Belts on 05/16/2023 kindly make sure he shows up for that appointment.   Tachycardia - chronic according to the patient his heart rate runs around 120 baseline for the last 15 to 20 years, takes low-dose beta-blocker for it.  Continue.  No acute issues.  TSH is stable.  Free T4 mildly elevated, repeat in 4 to 6 weeks   PAD -  Leg pain - > Known history of PAD status post multiple interventions including right femoropopliteal bypass and subsequent aortobifem bypass.  Venous ultrasound unremarkable, ABIs noted with unchanged but moderate to severe bilateral lower extremity disease. Continue Plavix and statin for secondary prevention.  Pain control.  He has been seen by vascular surgery Case discussed with them, ABI is unchanged supportive care, also noted MRI of his L-spine will discuss with Dr. Wynetta Emery neurosurgeon on-call however MRI largely unchanged and pain has improved.   Hypertension - Holding lisinopril as above,  Continue beta blocker, dose adjusted, added Norvasc.   Hyperlipidemia - Continue home rosuvastatin    Carotid artery disease > Known asymptomatic right carotid stenosis at 60-79%. - Continue home Plavix, rosuvastatin, seen by vascular surgery here this admission, post discharge follow-up with Dr. Myra Gianotti his primary vascular surgeon in 3 to 4 weeks.   Diabetes - continue Glucophage for now.  Discharge diagnosis     Principal Problem:   Acute renal failure superimposed on stage 3b chronic kidney disease,  unspecified acute renal failure type (HCC) Active Problems:   Essential hypertension   Hyperlipidemia   Type 2 diabetes mellitus (HCC)   PAD (peripheral artery disease) (HCC)   Sinus tachycardia   Leukocytosis    Discharge instructions    Discharge Instructions     Diet - low sodium heart healthy   Complete by: As directed    Discharge instructions   Complete by: As directed    Follow with Primary MD Avva, Ravisankar, MD in 7 days   Get CBC, CMP, 2 view Chest X ray -  checked next visit with your primary MD or SNF MD   Activity: As tolerated with Full fall precautions use walker/cane & assistance as needed  Disposition SNF  Diet: Heart Healthy    Special Instructions: If you have smoked or chewed Tobacco  in the last 2 yrs please stop smoking, stop any regular Alcohol  and or any Recreational drug use.  On your next visit with your primary care physician please Get Medicines reviewed and adjusted.  Please request your Prim.MD to go over all Hospital Tests and Procedure/Radiological results at the follow up, please get all Hospital records sent to your Prim MD by signing hospital release before you go home.  If you experience worsening of your admission symptoms, develop shortness of breath, life threatening emergency, suicidal or homicidal thoughts you must seek medical attention immediately by calling 911 or calling your MD immediately  if symptoms less severe.  You Must read complete instructions/literature along with all the possible adverse reactions/side effects for all the Medicines you take and that have been prescribed to you. Take any new Medicines after you have completely understood and accpet all the possible adverse reactions/side effects.   Do not drive when taking Pain medications.  Do not take more than prescribed Pain, Sleep and Anxiety Medications   Increase activity slowly   Complete by: As directed        Discharge Medications   Allergies as of  05/11/2023       Reactions   Asa [aspirin] Other (See Comments)   GI bleeding   Gadolinium Derivatives Itching, Swelling, Other (See Comments)   Pt complained of face flushing/hottness and throat tightness/scratchiness immediately after the injections.  Within 4 minutes, all symptoms were gone and the study was completed.  No further complications or signs of allergy were exhibited after completion of study.    Iodinated Contrast Media  Other Reaction(s): Not available   Pneumococcal 13-val Conj Vacc    Other Reaction(s): achiness all over, dizziness, nausea, weakness   Scopolamine    Other Reaction(s): Delerium   Oxycodone-acetaminophen Other (See Comments)   Dizziness and feeling of being uncomfortable         Medication List     STOP taking these medications    lisinopril 5 MG tablet Commonly known as: ZESTRIL   Toprol XL 25 MG 24 hr tablet Generic drug: metoprolol succinate       TAKE these medications    amLODipine 10 MG tablet Commonly known as: NORVASC Take 1 tablet (10 mg total) by mouth daily.   clopidogrel 75 MG tablet Commonly known as: PLAVIX TAKE 1 TABLET DAILY   ferrous sulfate 325 (65 FE) MG tablet Take 325 mg by mouth daily.   metFORMIN 1000 MG tablet Commonly known as: GLUCOPHAGE Take 500 mg by mouth 2 (two) times daily.   metoprolol tartrate 50 MG tablet Commonly known as: LOPRESSOR Take 1 tablet (50 mg total) by mouth 2 (two) times daily.   Myrbetriq 50 MG Tb24 tablet Generic drug: mirabegron ER Take 50 mg by mouth daily.   pantoprazole 40 MG tablet Commonly known as: PROTONIX Take 1 tablet (40 mg total) by mouth 2 (two) times daily.   polyethylene glycol 17 g packet Commonly known as: MIRALAX / GLYCOLAX Take 17 g by mouth daily as needed for mild constipation.   rosuvastatin 20 MG tablet Commonly known as: CRESTOR Take 20 mg by mouth every evening.   traMADol 50 MG tablet Commonly known as: Ultram Take 1 tablet (50 mg  total) by mouth every 12 (twelve) hours as needed.   UNABLE TO FIND Take 1 tablet by mouth daily as needed (solidify bowel movements). Med Name: [place]         Contact information for follow-up providers     Avva, Ravisankar, MD. Schedule an appointment as soon as possible for a visit in 1 week(s).   Specialty: Internal Medicine Contact information: [address] [place] [address] [phone]         Maxie Barb, MD. Schedule an appointment as soon as possible for a visit in 1 week(s).   Specialties: Nephrology, Internal Medicine Contact information: [address] [place] [address] [phone]         Nada Libman, MD. Schedule an appointment as soon as possible for a visit in 1 month(s).   Specialties: Vascular Surgery, Cardiology Contact information: [address] [place] [address] [phone]         Pollyann Savoy, MD. Schedule an appointment as soon as possible for a visit in 2 week(s).   Specialty: Rheumatology Contact information: [address] [place] [address] [phone]              Contact information for after-discharge care     Destination     [facility] Preferred SNF .   Service: Skilled Nursing Contact information: [address] [place] [address] [phone]                     Major procedures and Radiology Reports - PLEASE review detailed and final reports thoroughly  -        US RENAL  Result Date: 05/10/2023 CLINICAL DATA:  Acute kidney injury. EXAM: RENAL / URINARY TRACT ULTRASOUND COMPLETE COMPARISON:  Abdomen and pelvis CT dated 01/18/2019 FINDINGS: Right Kidney: Renal measurements: 10.1 x  5.4 x 4.7 cm = volume: 134 mL. 4.6 cm simple right renal cyst. This does not need imaging follow-up. Normal echotexture. No hydronephrosis. Left Kidney: Renal measurements: 9.3 x 6.0 x 4.5 cm = volume: 130 mL. Echogenicity  within normal limits. No mass or hydronephrosis visualized. Bladder: Appears normal for degree of bladder distention. Other: None. IMPRESSION: No significant abnormality. Electronically Signed   By: Beckie Salts M.D.   On: 05/10/2023 16:33   DG Chest Port 1 View  Result Date: 05/10/2023 CLINICAL DATA:  82 year old male with shortness of breath. EXAM: PORTABLE CHEST 1 VIEW COMPARISON:  Portable chest 05/08/2023 and earlier. FINDINGS: Portable AP upright view at 0656 hours. Right shoulder total arthroplasty redemonstrated. Mildly lower lung volumes. Mediastinal contours remain normal. Visualized tracheal air column is within normal limits. Vague increased interstitial opacity in the left lower lung since 05/07/2023. Otherwise when allowing for portable technique the lungs are clear. IMPRESSION: Lower lung volumes and vague increased opacity in the left lower lung since 05/07/2023. This may be atelectasis but developing infection not excluded. No other acute cardiopulmonary abnormality. Electronically Signed   By: Odessa Fleming M.D.   On: 05/10/2023 07:34   MR LUMBAR SPINE WO CONTRAST  Result Date: 05/09/2023 CLINICAL DATA:  82 year old male with persistent low back pain. EXAM: MRI LUMBAR SPINE WITHOUT CONTRAST TECHNIQUE: Multiplanar, multisequence MR imaging of the lumbar spine was performed. No intravenous contrast was administered. COMPARISON:  Lumbar MRI 03/15/2017. CT Abdomen and Pelvis 01/18/2019. FINDINGS: Segmentation: Normal on the comparison CT, the same numbering system used on the previous MRI. Alignment:  Stable lumbar lordosis. Vertebrae: Better demonstrated by CT is widespread interbody ankylosis from the lower thoracic spine through the L4 level on the 2020 CT. Normal background bone marrow signal. Maintained vertebral height. No marrow edema or evidence of acute osseous abnormality. Intact visible sacrum and SI joints. Conus medullaris and cauda equina: Conus extends to the L1-L2 level. No lower  spinal cord or conus signal abnormality. Normal cauda equina nerve roots. Paraspinal and other soft tissues: Negative. Disc levels: Grossly negative visible T10-T11 and T11-T12 levels. T12-L1: Chronic left paracentral disc or disc osteophyte superimposed on chronic ankylosis of this level (series 5, image 4). Chronic spinal stenosis here with mild mass effect just above the conus, stable from 2018. No conus signal abnormality. L1-L2:  Chronic ankylosis, negative. L2-L3: Chronic ankylosis. Mild to moderate facet and ligament flavum hypertrophy. Chronic right L2 foraminal stenosis is stable. L3-L4:  Chronic ankylosis.  No significant stenosis. L4-L5: Circumferential disc bulge with moderate facet and ligament flavum hypertrophy is chronic. Moderate spinal stenosis does appear mildly progressed since 2018 on series 5, image 30. Up to moderate lateral recess stenosis greater on the right (right L5 nerve level). Moderate left and moderate to severe right L4 neural foraminal stenosis have not significantly changed. L5-S1: Mild disc bulging. Moderate ligament flavum and moderate to severe facet hypertrophy. No spinal or convincing lateral recess stenosis. Moderate bilateral L5 foraminal stenosis is greater on the left and stable. IMPRESSION: 1. No acute osseous abnormality. Chronic spinal ankylosis from the lower thoracic spine through L4. 2. Degeneration at the unfused L4-L5 level has progressed since a 2018 MRI. Increased and moderate multifactorial spinal and lateral recess stenosis at that level (L5 nerve levels), while moderate to severe L4 level neural foraminal stenosis does not appear significantly changed. 3. Lesser progression of degeneration at the unfused L5-S1 level. Moderate bilateral foraminal stenosis there is stable. 4. Chronic left paracentral disc osteophyte complex at  T12-L1 resulting in mild spinal stenosis there and mild chronic spinal cord mass effect but no cord signal abnormality. Electronically  Signed   By: Odessa Fleming M.D.   On: 05/09/2023 11:48   VAS [place] ABI WITH/WO TBI  Result Date: 05/09/2023  LOWER EXTREMITY DOPPLER STUDY Patient Name:  AUSITN MANY  Date of Exam:   05/08/2023 Medical Rec #: [id number]     Accession #:    [id number] Date of Birth: 01-07-1941     Patient Gender: M Patient Age:   29 years Exam Location:  [facility] Procedure:      VAS [place] ABI WITH/WO TBI Referring Phys: Lyn Hollingshead MELVIN --------------------------------------------------------------------------------  Indications: Peripheral artery disease. Ongoing claudication. Right knee and              thigh pain. High Risk         Hypertension, hyperlipidemia, Diabetes, past history of Factors:          smoking. Other Factors: CKD III, sinus tachycardia, Right bundle branch block with left                anterior fasicular block, asymptomatic right ICA stenosis,                60-79%.  Vascular Interventions: Left common iliac stenting 20 years ago, left external                         iliac stent and right SFA stent 04/2010, 07/06/2018                         AO-Bifem BPG, 09/08/2017 Redo right femoral                         endarterectomy with angioplasty; 09/18/2016 Redo right                         femoral-popliteal BPG Right femoral-popliteal BPG. Limitations: Today's exam was limited due to Tachycardia and arrythmia. Comparison Study: Prior ABI done 10/16/22 Performing Technologist: Sherren Kerns RVS  Examination Guidelines: A complete evaluation includes at minimum, Doppler waveform signals and systolic blood pressure reading at the level of bilateral brachial, anterior tibial, and posterior tibial arteries, when vessel segments are accessible. Bilateral testing is considered an integral part of a complete examination. Photoelectric Plethysmograph (PPG) waveforms and toe systolic pressure readings are included as required and additional duplex testing as needed. Limited examinations for reoccurring indications may be  performed as noted.  ABI Findings: +---------+------------------+-----+----------+--------+ Right    Rt Pressure (mmHg)IndexWaveform  Comment  +---------+------------------+-----+----------+--------+ Brachial 136                    triphasic          +---------+------------------+-----+----------+--------+ PTA      93                0.66 monophasic         +---------+------------------+-----+----------+--------+ DP       75                0.54 monophasic         +---------+------------------+-----+----------+--------+ Great Toe39                0.28 Abnormal           +---------+------------------+-----+----------+--------+ +---------+------------------+-----+----------+-------+ Left     Lt Pressure (mmHg)IndexWaveform  Comment +---------+------------------+-----+----------+-------+ Brachial 140                    triphasic         +---------+------------------+-----+----------+-------+ PTA      75                0.54 monophasic        +---------+------------------+-----+----------+-------+ DP       86                0.61 monophasic        +---------+------------------+-----+----------+-------+ Great Toe42                0.30 Abnormal          +---------+------------------+-----+----------+-------+ +-------+-----------+-----------+------------+------------+ ABI/TBIToday's ABIToday's TBIPrevious ABIPrevious TBI +-------+-----------+-----------+------------+------------+ Right  0.66       0.28       0.59        0.38         +-------+-----------+-----------+------------+------------+ Left   0.61       0.30       0.69        0.46         +-------+-----------+-----------+------------+------------+ Bilateral ABIs appear essentially unchanged compared to prior study on 10/16/22. Right TBIs appear decreased since prior study done 10/16/22 and left TBIs appear slightly increased since prior study done 10/16/22.  Summary: Right: Resting right  ankle-brachial index indicates moderate right lower extremity arterial disease. The right toe-brachial index is abnormal. Left: Resting left ankle-brachial index indicates moderate left lower extremity arterial disease. The left toe-brachial index is abnormal. *See table(s) above for measurements and observations.  Electronically signed by Carolynn Sayers on 05/09/2023 at 3:52:11 AM.    Final    VAS [place] LOWER EXTREMITY BYPASS GRAFT DUPL  Result Date: 05/09/2023 LOWER EXTREMITY ARTERIAL DUPLEX STUDY Patient Name:  MAXELL MIGGINS  Date of Exam:   05/08/2023 Medical Rec #: [id number]     Accession #:    [id number] Date of Birth: 09-Dec-1940     Patient Gender: M Patient Age:   84 years Exam [facility] Procedure:      VAS [place] LOWER EXTREMITY BYPASS GRAFT DUPLEX Referring Phys: Carolynn Sayers --------------------------------------------------------------------------------  Indications: Peripheral artery disease, and Right thigh and knee pain, ongoing              claudication. High Risk Factors: Hypertension, hyperlipidemia, Diabetes, past history of                    smoking. Other Factors: CKD III, sinus tachycardia, Right bundle branch block with                left anterior fascicular block, asymptomatic right ICA                stenosis,60-79%.  Vascular Interventions: Left common iliac stenting 20 years ago, left                         external iliac stent and right SFA stent 04/2010,                         07/06/2018                         AO-Bi fem BPG, 09/08/2017 Redo right femoral  endarterectomy with angioplasty; 09/18/2016 Redo right                         femoral-popliteal BPG Right femoral-popliteal BPG. Current ABI:            R: 0.66/0.28 L;0.61/0.30 Limitations: Difficult to visualize vasculature/graft Comparison Study: Prior Right Bypass duplex done 10/02/2019 indicated occluded                   proximal portion of the fem-pop BPG with minimal retrograde                    to-and-fro flow observed in the mid an distal segments of the                   graft. Performing Technologist: Sherren Kerns RVS  Examination Guidelines: A complete evaluation includes B-mode imaging, spectral Doppler, color Doppler, and power Doppler as needed of all accessible portions of each vessel. Bilateral testing is considered an integral part of a complete examination. Limited examinations for reoccurring indications may be performed as noted.  +----------+--------+-----+--------+----------+-----------------+ RIGHT     PSV cm/sRatioStenosisWaveform  Comments          +----------+--------+-----+--------+----------+-----------------+ DFA       98                   biphasic                    +----------+--------+-----+--------+----------+-----------------+ SFA Prox               occluded                            +----------+--------+-----+--------+----------+-----------------+ SFA Mid                                  Collaterals noted +----------+--------+-----+--------+----------+-----------------+ SFA Distal                               Collaterals noted +----------+--------+-----+--------+----------+-----------------+ POP Prox  39                   monophasic                  +----------+--------+-----+--------+----------+-----------------+ ATA Mid   31                   monophasic                  +----------+--------+-----+--------+----------+-----------------+ PTA Prox  26                   monophasic                  +----------+--------+-----+--------+----------+-----------------+ PTA Mid   20                   monophasic                  +----------+--------+-----+--------+----------+-----------------+  Right Graft #1: Fem-Pop BPG +------------------+--------+--------+----------+-----------------------+                   PSV cm/sStenosisWaveform  Comments                 +------------------+--------+--------+----------+-----------------------+ Inflow  298             monophasic                        +------------------+--------+--------+----------+-----------------------+ Prox Anastomosis  489             monophasic                        +------------------+--------+--------+----------+-----------------------+ Proximal Graft            occluded                                  +------------------+--------+--------+----------+-----------------------+ Mid Graft                                   minimal retrograde flow +------------------+--------+--------+----------+-----------------------+ Distal Graft                                not visualized          +------------------+--------+--------+----------+-----------------------+ Distal Anastomosis                          not visualized          +------------------+--------+--------+----------+-----------------------+ Outflow           39              monophasic                        +------------------+--------+--------+----------+-----------------------+   Summary: Right: Occluded proximal graft with minimal retrograde flow noted in the mid portion as noted on prior study in 2021. Collateral flow noted mid to distal thigh.  See table(s) above for measurements and observations. Electronically signed by Carolynn Sayers on 05/09/2023 at 3:52:03 AM.    Final    DG Chest Port 1 View  Result Date: 05/08/2023 CLINICAL DATA:  Shortness of breath EXAM: PORTABLE CHEST 1 VIEW COMPARISON:  Chest x-ray 05/07/2023 FINDINGS: There is a nodular density overlying the left lower lung measuring approximally 13 mm, indeterminate, possibly a nipple shadow. The lungs are otherwise clear. There is no pleural effusion or pneumothorax. The cardiomediastinal silhouette is within normal limits. Right shoulder arthroplasty is present. No acute fractures. IMPRESSION: 13 mm nodular density overlying the  left lower lung, indeterminate. Recommend repeat chest x-ray with nipple markers or follow-up chest CT. Electronically Signed   By: Darliss Cheney M.D.   On: 05/08/2023 20:33   DG Chest Portable 1 View  Result Date: 05/07/2023 CLINICAL DATA:  Weakness, multiple falls. EXAM: PORTABLE CHEST 1 VIEW COMPARISON:  January 18, 2019. FINDINGS: The heart size and mediastinal contours are within normal limits. Both lungs are clear. Status post right shoulder arthroplasty. IMPRESSION: No active disease. Electronically Signed   By: Lupita Raider M.D.   On: 05/07/2023 16:40   DG Knee Complete 4 Views Right  Result Date: 05/07/2023 CLINICAL DATA:  82 year old male with right hip and knee pain. No known injury. EXAM: RIGHT KNEE - COMPLETE 4+ VIEW COMPARISON:  Right hip series today. FINDINGS: Femoral artery vascular stents continue to the distal 3rd femoral shaft. Surgical clips along the medial leg. Bone mineralization is within normal limits for age. Maintained alignment at the  right knee. No joint effusion on the cross-table lateral view. Normal joint spaces for age. Patella intact. No acute osseous abnormality identified. IMPRESSION: 1. No acute osseous abnormality or age advanced degeneration at the right knee. 2. Extensive right femoral artery vascular stents. Electronically Signed   By: Odessa Fleming M.D.   On: 05/07/2023 12:54   DG Hip Unilat W or Wo Pelvis 2-3 Views Right  Result Date: 05/07/2023 CLINICAL DATA:  82 year old male with right hip and knee pain. No known injury. EXAM: DG HIP (WITH OR WITHOUT PELVIS) 2-3V RIGHT COMPARISON:  CT Abdomen and Pelvis 01/18/2019. FINDINGS: Chronic iliac artery vascular stents. Bone mineralization is within normal limits for age. Pelvis appears intact. Femoral heads are normally located. Chronic bilateral acetabular degenerative spurring. Grossly intact proximal left femur. Proximal right femur appears intact. Extensive right femoral artery vascular stents. Multiple right leg  surgical clips. No acute osseous abnormality identified. Negative visible bowel gas. IMPRESSION: 1. No acute osseous abnormality identified about the pelvis or right hip. 2. Chronic iliac and right femoral artery vascular stents. Electronically Signed   By: Odessa Fleming M.D.   On: 05/07/2023 12:53   US Venous Img Lower Right (DVT Study)  Result Date: 05/07/2023 CLINICAL DATA:  leg pain. History. The with reported lower extremity bypass EXAM: RIGHT LOWER EXTREMITY VENOUS DOPPLER ULTRASOUND TECHNIQUE: Gray-scale sonography with compression, as well as color and duplex ultrasound, were performed to evaluate the deep venous system(s) from the level of the common femoral vein through the popliteal and proximal calf veins. COMPARISON:  RIGHT lower extremity XRs, 05/07/2023 FINDINGS: VENOUS Normal compressibility of the common femoral, superficial femoral, and popliteal veins, as well as the visualized calf veins. Visualized portions of profunda femoral vein and great saphenous vein unremarkable. No filling defects to suggest DVT on grayscale or color Doppler imaging. Doppler waveforms show normal direction of venous flow, normal respiratory plasticity and response to augmentation. Limited views of the contralateral common femoral vein are unremarkable. OTHER No evidence of superficial thrombophlebitis or abnormal fluid collection. Limitations: none IMPRESSION: No evidence of femoropopliteal DVT or superficial thrombophlebitis within the RIGHT lower extremity. Roanna Banning, MD [facility]iology Electronically Signed   By: Roanna Banning M.D.   On: 05/07/2023 12:02    Micro Results     No results found for this or any previous visit (from the past 240 hour(s)).  Today   Subjective    Nickles Blackledge today has no headache,no chest abdominal pain,no new weakness tingling or numbness, feels much better wants to go home today.     Objective   Blood pressure (!)  151/64, pulse (!) 116, temperature 99.3 F (37.4 C), temperature source Oral, resp. rate 20, height 5\' 4"  (1.626 m), weight 71.7 kg, SpO2 95%.   Intake/Output Summary (Last 24 hours) at 05/11/2023 0854 Last data filed at 05/11/2023 0837 Gross per 24 hour  Intake --  Output 1200 ml  Net -1200 ml    Exam  Awake Alert, No new F.N deficits,    Stollings.AT,PERRAL Supple Neck,   Symmetrical Chest wall movement, Good air movement bilaterally, CTAB RRR,No Gallops,   +ve B.Sounds, Abd Soft, Non tender,  No Cyanosis, Clubbing or edema    Data Review   Recent Labs  Lab 05/07/23 1058 05/08/23 0418 05/09/23 0454 05/10/23 0358 05/11/23 0323  WBC 18.7* 12.7* 9.8 9.7 10.0  HGB 10.6* 9.2* 9.2* 9.8* 9.1*  HCT 31.8* 28.1* 28.6* 30.6* 27.7*  PLT 328 260 277  306 289  MCV 91.4 91.5 91.7 91.1 91.1  MCH 30.5 30.0 29.5 29.2 29.9  MCHC 33.3 32.7 32.2 32.0 32.9  RDW 13.2 13.1 12.9 12.7 12.7  LYMPHSABS 1.1  --  0.6* 0.7 0.9  MONOABS 1.3*  --  0.8 1.0 1.0  EOSABS 0.0  --  0.1 0.1 0.1  BASOSABS 0.1  --  0.0 0.1 0.1    Recent Labs  Lab 05/07/23 1058 05/07/23 1231 05/08/23 0418 05/09/23 0454 05/10/23 0358 05/10/23 1129 05/11/23 0323  NA 134*  --  138 138 136  --  136  K 4.3  --  4.4 4.1 4.3  --  4.3  CL 98  --  105 105 103  --  100  CO2 26  --  24 24 24   --  25  ANIONGAP 10  --  9 9 9   --  11  GLUCOSE 189*  --  135* 175* 155*  --  169*  BUN 46*  --  33* 34* 36*  --  41*  CREATININE 2.67*  --  2.20* 2.38* 2.43*  --  2.72*  AST 13*  --  15  --   --   --   --   ALT 7  --  13  --   --   --   --   ALKPHOS 76  --  70  --   --   --   --   BILITOT 0.7  --  0.9  --   --   --   --   ALBUMIN 4.4  --  2.8*  --   --   --   --   CRP  --   --   --  23.6* 23.8*  --  26.7*  PROCALCITON  --   --   --  0.85 0.75  --  0.81  LATICACIDVEN  --  1.4  --   --   --   --   --   TSH  --   --  1.279 1.150  --   --   --   HGBA1C  --   --   --   --   --  7.0*  --   BNP  --   --   --  361.6* 286.5*  --  338.1*   MG 1.6*  --  1.9 1.9 1.8  --  1.8  CALCIUM 9.6  --  8.7* 8.7* 8.8*  --  8.8*    Total Time in preparing paper work, data evaluation and todays exam - 35 minutes  Signature  -    Susa Raring M.D on 05/11/2023 at 8:54 AM   -  To page go to [email or web address]

## 2023-05-11 NOTE — Plan of Care (Signed)

## 2023-05-11 NOTE — TOC Transition Note (Signed)
Transition of Care [facility]) - CM/SW Discharge Note   Patient Details  Name: Christian Bailey MRN: [id number] Date of Birth: [birth year]  [facility]) CM/SW Contact:  Mearl Latin, LCSW Phone Number: 05/11/2023, 11:17 AM   Clinical Narrative:    Patient will DC to: [facility]  Anticipated DC date: 05/11/23 Family notified: Spouse Transport by: Sharin Mons   Per MD patient ready for DC to [facility]. RN to call report prior to discharge [phone] press 0 for nurse, room 3217). RN, patient, patient's family, and facility notified of DC. Discharge Summary and FL2 sent to facility. DC packet on chart including signed script. Ambulance transport requested for patient.   CSW will sign off for now as social work intervention is no longer needed. Please consult [place] again if new needs arise.     Final next level of care: Skilled Nursing Facility Barriers to Discharge: Barriers Resolved   Patient Goals and CMS Choice CMS Medicare.gov Compare Post Acute Care list provided to:: Patient Represenative (must comment) Choice offered to / list presented to : Patient, Adult Children, Spouse  Discharge Placement     Existing PASRR number confirmed : 05/11/23          Patient chooses bed at: [facility] Patient to be transferred to facility by: [facility] Name of family member notified: Spouse and son Patient and family notified of of transfer: 05/11/23  Discharge Plan and Services Additional resources added to the After Visit Summary for   In-house Referral: Clinical Social Work                                   Social Determinants of Health (SDOH) Interventions SDOH Screenings   Food Insecurity: No Food Insecurity (05/07/2023)  Housing: Low Risk  (05/07/2023)  Transportation Needs: No Transportation Needs (05/07/2023)  Utilities: Not At Risk (05/07/2023)  Social Connections: Unknown (09/04/2022)   Received from [facility], [facility]  Tobacco Use:  Medium Risk (05/07/2023)     Readmission Risk Interventions     No data to display

## 2023-05-11 NOTE — Progress Notes (Signed)
Mobility Specialist Progress Note;   05/11/23 1035  Mobility  Activity Stood at bedside (Sit to stand from chair)  Level of Assistance Moderate assist, patient does 50-74%  Assistive Device Front wheel walker  Activity Response Tolerated well  Mobility Referral Yes  $Mobility charge 1 Mobility  Mobility Specialist Start Time (ACUTE ONLY) 1035  Mobility Specialist Stop Time (ACUTE ONLY) 1045  Mobility Specialist Time Calculation (min) (ACUTE ONLY) 10 min   Pt agreeable to mobility, in chair upon arrival. Practiced STS with pt to maintain RW safety and increase strength. Completed 5x STS from chair requiring modA with 1 person. D/t HR in 120-130s no further ambulation continued during session. No c/o throughout. Pt back in chair with all needs met. Awaiting discharge.   Christian Bailey Mobility Specialist Please contact via SecureChat or Rehab Office [phone]

## 2023-05-11 NOTE — TOC Progression Note (Signed)
Transition of Care [facility]) - Progression Note    Patient Details  Name: Christian Bailey MRN: [id number] Date of Birth: [birth year]  Transition of [facility]) CM/SW Contact  Mearl Latin, LCSW Phone Number: 05/11/2023, 11:16 AM  Clinical Narrative:    CSW left voicemail for patient's son.  CSW spoke with patient's spouse who is arriving at the hospital. She requested [facility] for transport to [facility].    Expected Discharge Plan: Skilled Nursing Facility Barriers to Discharge: Barriers Resolved  Expected Discharge Plan and Services In-house Referral: Clinical Social Work     Living arrangements for the past 2 months: Single Family Home Expected Discharge Date: 05/11/23                                     Social Determinants of Health (SDOH) Interventions SDOH Screenings   Food Insecurity: No Food Insecurity (05/07/2023)  Housing: Low Risk  (05/07/2023)  Transportation Needs: No Transportation Needs (05/07/2023)  Utilities: Not At Risk (05/07/2023)  Social Connections: Unknown (09/04/2022)   Received from [facility]  Tobacco Use: Medium Risk (05/07/2023)    Readmission Risk Interventions     No data to display

## 2023-05-12 ENCOUNTER — Telehealth: Payer: Self-pay

## 2023-05-12 DIAGNOSIS — I10 Essential (primary) hypertension: Secondary | ICD-10-CM | POA: Diagnosis not present

## 2023-05-12 DIAGNOSIS — E785 Hyperlipidemia, unspecified: Secondary | ICD-10-CM | POA: Diagnosis not present

## 2023-05-12 DIAGNOSIS — J189 Pneumonia, unspecified organism: Secondary | ICD-10-CM | POA: Diagnosis not present

## 2023-05-12 DIAGNOSIS — I779 Disorder of arteries and arterioles, unspecified: Secondary | ICD-10-CM | POA: Diagnosis not present

## 2023-05-12 NOTE — Telephone Encounter (Signed)
Nesie, Presenter, broadcasting for [employer], called stating that the pt is out of the hospital and needs to be rescheduled.  Reviewed pt's chart, returned call for clarification, two identifiers used. Pt able to make appts originally scheduled for 11/18. Appts rescheduled. She confirmed that pt can get out of wheelchair and get to bed for [place] on his own. Wife will most likely be accompanying pt as well. Confirmed understanding.

## 2023-05-13 DIAGNOSIS — I10 Essential (primary) hypertension: Secondary | ICD-10-CM | POA: Diagnosis not present

## 2023-05-13 DIAGNOSIS — I6521 Occlusion and stenosis of right carotid artery: Secondary | ICD-10-CM | POA: Diagnosis not present

## 2023-05-13 DIAGNOSIS — E785 Hyperlipidemia, unspecified: Secondary | ICD-10-CM | POA: Diagnosis not present

## 2023-05-13 DIAGNOSIS — I739 Peripheral vascular disease, unspecified: Secondary | ICD-10-CM | POA: Diagnosis not present

## 2023-05-13 DIAGNOSIS — R000 Tachycardia, unspecified: Secondary | ICD-10-CM | POA: Diagnosis not present

## 2023-05-13 DIAGNOSIS — I779 Disorder of arteries and arterioles, unspecified: Secondary | ICD-10-CM | POA: Diagnosis not present

## 2023-05-13 DIAGNOSIS — E1122 Type 2 diabetes mellitus with diabetic chronic kidney disease: Secondary | ICD-10-CM | POA: Diagnosis not present

## 2023-05-16 DIAGNOSIS — I779 Disorder of arteries and arterioles, unspecified: Secondary | ICD-10-CM | POA: Diagnosis not present

## 2023-05-16 DIAGNOSIS — E785 Hyperlipidemia, unspecified: Secondary | ICD-10-CM | POA: Diagnosis not present

## 2023-05-16 DIAGNOSIS — I10 Essential (primary) hypertension: Secondary | ICD-10-CM | POA: Diagnosis not present

## 2023-05-17 ENCOUNTER — Ambulatory Visit (HOSPITAL_COMMUNITY): Payer: Medicare Other

## 2023-05-17 ENCOUNTER — Encounter (HOSPITAL_COMMUNITY): Payer: Medicare Other

## 2023-05-18 DIAGNOSIS — I6521 Occlusion and stenosis of right carotid artery: Secondary | ICD-10-CM | POA: Diagnosis not present

## 2023-05-18 DIAGNOSIS — E785 Hyperlipidemia, unspecified: Secondary | ICD-10-CM | POA: Diagnosis not present

## 2023-05-18 DIAGNOSIS — I739 Peripheral vascular disease, unspecified: Secondary | ICD-10-CM | POA: Diagnosis not present

## 2023-05-18 DIAGNOSIS — I10 Essential (primary) hypertension: Secondary | ICD-10-CM | POA: Diagnosis not present

## 2023-05-18 DIAGNOSIS — R000 Tachycardia, unspecified: Secondary | ICD-10-CM | POA: Diagnosis not present

## 2023-05-18 DIAGNOSIS — E1122 Type 2 diabetes mellitus with diabetic chronic kidney disease: Secondary | ICD-10-CM | POA: Diagnosis not present

## 2023-05-18 DIAGNOSIS — I779 Disorder of arteries and arterioles, unspecified: Secondary | ICD-10-CM | POA: Diagnosis not present

## 2023-05-20 DIAGNOSIS — I779 Disorder of arteries and arterioles, unspecified: Secondary | ICD-10-CM | POA: Diagnosis not present

## 2023-05-20 DIAGNOSIS — I10 Essential (primary) hypertension: Secondary | ICD-10-CM | POA: Diagnosis not present

## 2023-05-20 DIAGNOSIS — E785 Hyperlipidemia, unspecified: Secondary | ICD-10-CM | POA: Diagnosis not present

## 2023-05-21 DIAGNOSIS — I6521 Occlusion and stenosis of right carotid artery: Secondary | ICD-10-CM | POA: Diagnosis not present

## 2023-05-21 DIAGNOSIS — E785 Hyperlipidemia, unspecified: Secondary | ICD-10-CM | POA: Diagnosis not present

## 2023-05-21 DIAGNOSIS — I779 Disorder of arteries and arterioles, unspecified: Secondary | ICD-10-CM | POA: Diagnosis not present

## 2023-05-21 DIAGNOSIS — I739 Peripheral vascular disease, unspecified: Secondary | ICD-10-CM | POA: Diagnosis not present

## 2023-05-21 DIAGNOSIS — I10 Essential (primary) hypertension: Secondary | ICD-10-CM | POA: Diagnosis not present

## 2023-05-21 DIAGNOSIS — E1122 Type 2 diabetes mellitus with diabetic chronic kidney disease: Secondary | ICD-10-CM | POA: Diagnosis not present

## 2023-05-21 DIAGNOSIS — R000 Tachycardia, unspecified: Secondary | ICD-10-CM | POA: Diagnosis not present

## 2023-05-24 DIAGNOSIS — E785 Hyperlipidemia, unspecified: Secondary | ICD-10-CM | POA: Diagnosis not present

## 2023-05-24 DIAGNOSIS — I779 Disorder of arteries and arterioles, unspecified: Secondary | ICD-10-CM | POA: Diagnosis not present

## 2023-05-24 DIAGNOSIS — I10 Essential (primary) hypertension: Secondary | ICD-10-CM | POA: Diagnosis not present

## 2023-05-26 DIAGNOSIS — D631 Anemia in chronic kidney disease: Secondary | ICD-10-CM | POA: Diagnosis not present

## 2023-05-26 DIAGNOSIS — E875 Hyperkalemia: Secondary | ICD-10-CM | POA: Diagnosis not present

## 2023-05-26 DIAGNOSIS — E1122 Type 2 diabetes mellitus with diabetic chronic kidney disease: Secondary | ICD-10-CM | POA: Diagnosis not present

## 2023-05-26 DIAGNOSIS — N184 Chronic kidney disease, stage 4 (severe): Secondary | ICD-10-CM | POA: Diagnosis not present

## 2023-05-26 DIAGNOSIS — Q6102 Congenital multiple renal cysts: Secondary | ICD-10-CM | POA: Diagnosis not present

## 2023-05-26 DIAGNOSIS — I739 Peripheral vascular disease, unspecified: Secondary | ICD-10-CM | POA: Diagnosis not present

## 2023-05-26 DIAGNOSIS — I129 Hypertensive chronic kidney disease with stage 1 through stage 4 chronic kidney disease, or unspecified chronic kidney disease: Secondary | ICD-10-CM | POA: Diagnosis not present

## 2023-05-29 DIAGNOSIS — I129 Hypertensive chronic kidney disease with stage 1 through stage 4 chronic kidney disease, or unspecified chronic kidney disease: Secondary | ICD-10-CM | POA: Diagnosis not present

## 2023-05-29 DIAGNOSIS — G894 Chronic pain syndrome: Secondary | ICD-10-CM | POA: Diagnosis not present

## 2023-05-29 DIAGNOSIS — N1832 Chronic kidney disease, stage 3b: Secondary | ICD-10-CM | POA: Diagnosis not present

## 2023-05-29 DIAGNOSIS — E1151 Type 2 diabetes mellitus with diabetic peripheral angiopathy without gangrene: Secondary | ICD-10-CM | POA: Diagnosis not present

## 2023-05-29 DIAGNOSIS — E785 Hyperlipidemia, unspecified: Secondary | ICD-10-CM | POA: Diagnosis not present

## 2023-05-29 DIAGNOSIS — I6521 Occlusion and stenosis of right carotid artery: Secondary | ICD-10-CM | POA: Diagnosis not present

## 2023-05-29 DIAGNOSIS — N179 Acute kidney failure, unspecified: Secondary | ICD-10-CM | POA: Diagnosis not present

## 2023-05-29 DIAGNOSIS — Z7902 Long term (current) use of antithrombotics/antiplatelets: Secondary | ICD-10-CM | POA: Diagnosis not present

## 2023-05-29 DIAGNOSIS — H3530 Unspecified macular degeneration: Secondary | ICD-10-CM | POA: Diagnosis not present

## 2023-05-29 DIAGNOSIS — E1122 Type 2 diabetes mellitus with diabetic chronic kidney disease: Secondary | ICD-10-CM | POA: Diagnosis not present

## 2023-06-03 DIAGNOSIS — E1122 Type 2 diabetes mellitus with diabetic chronic kidney disease: Secondary | ICD-10-CM | POA: Diagnosis not present

## 2023-06-03 DIAGNOSIS — N1832 Chronic kidney disease, stage 3b: Secondary | ICD-10-CM | POA: Diagnosis not present

## 2023-06-03 DIAGNOSIS — E1151 Type 2 diabetes mellitus with diabetic peripheral angiopathy without gangrene: Secondary | ICD-10-CM | POA: Diagnosis not present

## 2023-06-03 DIAGNOSIS — G894 Chronic pain syndrome: Secondary | ICD-10-CM | POA: Diagnosis not present

## 2023-06-03 DIAGNOSIS — I6521 Occlusion and stenosis of right carotid artery: Secondary | ICD-10-CM | POA: Diagnosis not present

## 2023-06-03 DIAGNOSIS — I129 Hypertensive chronic kidney disease with stage 1 through stage 4 chronic kidney disease, or unspecified chronic kidney disease: Secondary | ICD-10-CM | POA: Diagnosis not present

## 2023-06-04 ENCOUNTER — Other Ambulatory Visit: Payer: Self-pay | Admitting: Family Medicine

## 2023-06-04 DIAGNOSIS — I129 Hypertensive chronic kidney disease with stage 1 through stage 4 chronic kidney disease, or unspecified chronic kidney disease: Secondary | ICD-10-CM | POA: Diagnosis not present

## 2023-06-04 DIAGNOSIS — R7982 Elevated C-reactive protein (CRP): Secondary | ICD-10-CM | POA: Diagnosis not present

## 2023-06-04 DIAGNOSIS — R7989 Other specified abnormal findings of blood chemistry: Secondary | ICD-10-CM | POA: Diagnosis not present

## 2023-06-04 DIAGNOSIS — K5900 Constipation, unspecified: Secondary | ICD-10-CM | POA: Diagnosis not present

## 2023-06-04 DIAGNOSIS — J189 Pneumonia, unspecified organism: Secondary | ICD-10-CM

## 2023-06-04 DIAGNOSIS — N1832 Chronic kidney disease, stage 3b: Secondary | ICD-10-CM | POA: Diagnosis not present

## 2023-06-04 DIAGNOSIS — E1151 Type 2 diabetes mellitus with diabetic peripheral angiopathy without gangrene: Secondary | ICD-10-CM | POA: Diagnosis not present

## 2023-06-04 DIAGNOSIS — N179 Acute kidney failure, unspecified: Secondary | ICD-10-CM | POA: Diagnosis not present

## 2023-06-04 DIAGNOSIS — I739 Peripheral vascular disease, unspecified: Secondary | ICD-10-CM | POA: Diagnosis not present

## 2023-06-04 DIAGNOSIS — I2510 Atherosclerotic heart disease of native coronary artery without angina pectoris: Secondary | ICD-10-CM | POA: Diagnosis not present

## 2023-06-04 DIAGNOSIS — R000 Tachycardia, unspecified: Secondary | ICD-10-CM | POA: Diagnosis not present

## 2023-06-07 ENCOUNTER — Ambulatory Visit
Admission: RE | Admit: 2023-06-07 | Discharge: 2023-06-07 | Disposition: A | Discharge status: Home / Self Care | Payer: Medicare Other | Source: Ambulatory Visit | Attending: Family Medicine | Admitting: Family Medicine

## 2023-06-07 DIAGNOSIS — I739 Peripheral vascular disease, unspecified: Secondary | ICD-10-CM | POA: Diagnosis not present

## 2023-06-07 DIAGNOSIS — B351 Tinea unguium: Secondary | ICD-10-CM | POA: Diagnosis not present

## 2023-06-07 DIAGNOSIS — J189 Pneumonia, unspecified organism: Secondary | ICD-10-CM

## 2023-06-07 DIAGNOSIS — E1142 Type 2 diabetes mellitus with diabetic polyneuropathy: Secondary | ICD-10-CM | POA: Diagnosis not present

## 2023-06-07 DIAGNOSIS — L603 Nail dystrophy: Secondary | ICD-10-CM | POA: Diagnosis not present

## 2023-06-07 DIAGNOSIS — M2041 Other hammer toe(s) (acquired), right foot: Secondary | ICD-10-CM | POA: Diagnosis not present

## 2023-06-08 DIAGNOSIS — G894 Chronic pain syndrome: Secondary | ICD-10-CM | POA: Diagnosis not present

## 2023-06-08 DIAGNOSIS — I6521 Occlusion and stenosis of right carotid artery: Secondary | ICD-10-CM | POA: Diagnosis not present

## 2023-06-08 DIAGNOSIS — E1122 Type 2 diabetes mellitus with diabetic chronic kidney disease: Secondary | ICD-10-CM | POA: Diagnosis not present

## 2023-06-08 DIAGNOSIS — N1832 Chronic kidney disease, stage 3b: Secondary | ICD-10-CM | POA: Diagnosis not present

## 2023-06-08 DIAGNOSIS — I129 Hypertensive chronic kidney disease with stage 1 through stage 4 chronic kidney disease, or unspecified chronic kidney disease: Secondary | ICD-10-CM | POA: Diagnosis not present

## 2023-06-08 DIAGNOSIS — E1151 Type 2 diabetes mellitus with diabetic peripheral angiopathy without gangrene: Secondary | ICD-10-CM | POA: Diagnosis not present

## 2023-06-18 DIAGNOSIS — I129 Hypertensive chronic kidney disease with stage 1 through stage 4 chronic kidney disease, or unspecified chronic kidney disease: Secondary | ICD-10-CM | POA: Diagnosis not present

## 2023-06-18 DIAGNOSIS — N1832 Chronic kidney disease, stage 3b: Secondary | ICD-10-CM | POA: Diagnosis not present

## 2023-06-18 DIAGNOSIS — E1151 Type 2 diabetes mellitus with diabetic peripheral angiopathy without gangrene: Secondary | ICD-10-CM | POA: Diagnosis not present

## 2023-06-18 DIAGNOSIS — G894 Chronic pain syndrome: Secondary | ICD-10-CM | POA: Diagnosis not present

## 2023-06-18 DIAGNOSIS — I6521 Occlusion and stenosis of right carotid artery: Secondary | ICD-10-CM | POA: Diagnosis not present

## 2023-06-18 DIAGNOSIS — E1122 Type 2 diabetes mellitus with diabetic chronic kidney disease: Secondary | ICD-10-CM | POA: Diagnosis not present

## 2023-06-22 DIAGNOSIS — I6521 Occlusion and stenosis of right carotid artery: Secondary | ICD-10-CM | POA: Diagnosis not present

## 2023-06-22 DIAGNOSIS — E1122 Type 2 diabetes mellitus with diabetic chronic kidney disease: Secondary | ICD-10-CM | POA: Diagnosis not present

## 2023-06-22 DIAGNOSIS — E1151 Type 2 diabetes mellitus with diabetic peripheral angiopathy without gangrene: Secondary | ICD-10-CM | POA: Diagnosis not present

## 2023-06-22 DIAGNOSIS — N1832 Chronic kidney disease, stage 3b: Secondary | ICD-10-CM | POA: Diagnosis not present

## 2023-06-22 DIAGNOSIS — I129 Hypertensive chronic kidney disease with stage 1 through stage 4 chronic kidney disease, or unspecified chronic kidney disease: Secondary | ICD-10-CM | POA: Diagnosis not present

## 2023-06-22 DIAGNOSIS — G894 Chronic pain syndrome: Secondary | ICD-10-CM | POA: Diagnosis not present

## 2023-06-28 ENCOUNTER — Other Ambulatory Visit: Payer: Self-pay | Admitting: Cardiovascular Disease

## 2023-06-28 DIAGNOSIS — N179 Acute kidney failure, unspecified: Secondary | ICD-10-CM | POA: Diagnosis not present

## 2023-06-28 DIAGNOSIS — N1832 Chronic kidney disease, stage 3b: Secondary | ICD-10-CM | POA: Diagnosis not present

## 2023-06-28 DIAGNOSIS — I6521 Occlusion and stenosis of right carotid artery: Secondary | ICD-10-CM | POA: Diagnosis not present

## 2023-06-28 DIAGNOSIS — E1122 Type 2 diabetes mellitus with diabetic chronic kidney disease: Secondary | ICD-10-CM | POA: Diagnosis not present

## 2023-06-28 DIAGNOSIS — H3530 Unspecified macular degeneration: Secondary | ICD-10-CM | POA: Diagnosis not present

## 2023-06-28 DIAGNOSIS — I129 Hypertensive chronic kidney disease with stage 1 through stage 4 chronic kidney disease, or unspecified chronic kidney disease: Secondary | ICD-10-CM | POA: Diagnosis not present

## 2023-06-28 DIAGNOSIS — E785 Hyperlipidemia, unspecified: Secondary | ICD-10-CM | POA: Diagnosis not present

## 2023-06-28 DIAGNOSIS — E1151 Type 2 diabetes mellitus with diabetic peripheral angiopathy without gangrene: Secondary | ICD-10-CM | POA: Diagnosis not present

## 2023-06-28 DIAGNOSIS — G894 Chronic pain syndrome: Secondary | ICD-10-CM | POA: Diagnosis not present

## 2023-06-28 DIAGNOSIS — Z7902 Long term (current) use of antithrombotics/antiplatelets: Secondary | ICD-10-CM | POA: Diagnosis not present

## 2023-06-29 DIAGNOSIS — I129 Hypertensive chronic kidney disease with stage 1 through stage 4 chronic kidney disease, or unspecified chronic kidney disease: Secondary | ICD-10-CM | POA: Diagnosis not present

## 2023-06-29 DIAGNOSIS — E1151 Type 2 diabetes mellitus with diabetic peripheral angiopathy without gangrene: Secondary | ICD-10-CM | POA: Diagnosis not present

## 2023-06-29 DIAGNOSIS — E785 Hyperlipidemia, unspecified: Secondary | ICD-10-CM | POA: Diagnosis not present

## 2023-06-29 DIAGNOSIS — I6521 Occlusion and stenosis of right carotid artery: Secondary | ICD-10-CM | POA: Diagnosis not present

## 2023-06-29 DIAGNOSIS — N1832 Chronic kidney disease, stage 3b: Secondary | ICD-10-CM | POA: Diagnosis not present

## 2023-06-29 DIAGNOSIS — N179 Acute kidney failure, unspecified: Secondary | ICD-10-CM | POA: Diagnosis not present

## 2023-06-29 DIAGNOSIS — H3530 Unspecified macular degeneration: Secondary | ICD-10-CM | POA: Diagnosis not present

## 2023-06-29 DIAGNOSIS — G894 Chronic pain syndrome: Secondary | ICD-10-CM | POA: Diagnosis not present

## 2023-06-29 DIAGNOSIS — Z7902 Long term (current) use of antithrombotics/antiplatelets: Secondary | ICD-10-CM | POA: Diagnosis not present

## 2023-06-29 DIAGNOSIS — E1122 Type 2 diabetes mellitus with diabetic chronic kidney disease: Secondary | ICD-10-CM | POA: Diagnosis not present

## 2023-07-05 ENCOUNTER — Encounter: Payer: Self-pay | Admitting: Cardiovascular Disease

## 2023-07-05 ENCOUNTER — Ambulatory Visit: Payer: Medicare Other | Attending: Cardiovascular Disease | Admitting: Cardiovascular Disease

## 2023-07-05 VITALS — BP 86/46 | HR 115 | Ht 65.0 in | Wt 144.0 lb

## 2023-07-05 DIAGNOSIS — I739 Peripheral vascular disease, unspecified: Secondary | ICD-10-CM

## 2023-07-05 DIAGNOSIS — R000 Tachycardia, unspecified: Secondary | ICD-10-CM | POA: Diagnosis not present

## 2023-07-05 DIAGNOSIS — I6521 Occlusion and stenosis of right carotid artery: Secondary | ICD-10-CM | POA: Diagnosis not present

## 2023-07-05 DIAGNOSIS — I10 Essential (primary) hypertension: Secondary | ICD-10-CM

## 2023-07-05 DIAGNOSIS — E782 Mixed hyperlipidemia: Secondary | ICD-10-CM

## 2023-07-05 NOTE — Assessment & Plan Note (Signed)
 History of PAD status post remote left iliac stenting 20 years ago.  I angiogram him 05/12/2010 revealing intact left common iliac artery stent with a high-grade left external iliac artery stenosis which I stented.  I also put 2 stents in his right SFA.  He has a known occluded left SFA.  I restarted him 05/14/2014 revealing patent iliac stents, occluded left SFA and in-stent restenosis within the right SFA stent.  I performed drug-eluting balloon angioplasty with significant improvement.  I read angiogram to him 04/20/2016 revealing an occluded right SFA and ultimately requiring right femoropopliteal bypass grafting by Dr. Serene as well as aortobifemoral bypass grafting.  His most recent graft ultrasound performed 05/08/2023 revealed a right ABI of 0.66 and a left of 0.61 with an occluded and occluded right SFA.  Does complain of right lower lobe greater than left lower extremity claudication.

## 2023-07-05 NOTE — Assessment & Plan Note (Signed)
Chronic, unclear etiology 

## 2023-07-05 NOTE — Assessment & Plan Note (Signed)
 History of essential hypertension her blood pressure measured today at 90/44.  He apparently still is on lisinopril  although this is not listed on his medication list.  His list says that he is on amlodipine  which she denies being on.  He also is on metoprolol  50 mg p.o. twice daily.  His heart rate today is 125 for unclear reasons although a year ago his heart rate was 112.

## 2023-07-05 NOTE — Assessment & Plan Note (Signed)
 History of hyperlipidemia on statin therapy with lipid profile performed 07/13/2022 revealing a total cholesterol 122, LDL 53 HDL 37.

## 2023-07-05 NOTE — Progress Notes (Signed)
 07/05/2023 Franky Shin   Jan 29, 1941  [id number]  Primary Physician Janey Santos, MD Primary Cardiologist: Dorn JINNY Lesches MD [facility] [place], [place]  HPI:  Christian Bailey is a 83 y.o.  married Caucasian male, father of 2, grandfather to 1 grandchild who I last saw 07/07/2022.SABRA  He is accompanied by his wife [name] today.SABRASABRAHe  has a history of PVOD and stenting of his left common iliac artery 20 years ago. He was smoking 3 packs a day at that time but stopped smoking 20 years ago.   His other problems include hypertension, hyperlipidemia and noninsulin-requiring diabetes. He had a negative Myoview  in our office August 18, 2010. Because of claudication, I performed angiograph on him May 12, 2010, revealing an intact left common iliac artery stent, high-grade left external iliac artery stenosis which I stented using a 7 mm x 3 cm long Nitinol self-expanding stent. I also put 2 stents in his right SFA. Followup Dopplers showed improvement but symptomatically he did not really improve much. He still complains of predictable bilateral lower extremity claudication at several hundred yards. His Dopplers have remained stable. Dr. Janey follows his lipid profile which was recently done earlier this month revealing a total cholesterol of 153, LDL of 86 and HDL of 34. Recent lower with Doppler studies performed in October 2014 revealed patent stents with a right ABI of 0.81 and a left of 0.67.over the past year he's noticed progressive but without limiting claudication with recent Dopplers performed 04/04/14 revealing a decline in his right ABI from 0.81-0.53 with progression of disease in his right SFA suggesting in-stent restenosis.I also performed carotid Doppler studies that suggested moderately severe right internal carotid artery stenosis.    As a result of these studies I performed angiography on him 05/14/14 revealing a patent left external iliac artery stent with at most 30-40% in-stent  restenosis, and known occluded left SFA with two-vessel runoff (occluded posterior tibial), patent proximal right SFA stent with diffuse 95% in-stent restenosis within the distal right SFA stent. He had three-vessel runoff on the right. I performed a drug eluting balloon angioplasty for the in-stent restenosis with excellent angiographic and Doppler result although his symptoms did not significantly improve. I also angiogram his carotids because of a moderately severe right ICA stenosis by duplex ultrasound revealing a 50% hypodense right internal carotid artery stenosis with a bovine arch. We have continued to follow these noninvasively. He is neurologically asymptomatic. He denies chest pain or shortness of breath. Since I saw him a year daylene has noticed increasing claudication bilaterally. We obtained lower extremity Dopplers 12/09/15 revealing a decline in his right ABI from 0.85 down to 0.68 and occluded distal right SFA. We did begin him on Pletal  which she has taken for last several months with minimal benefit. I angiogram 1010/23/17 revealing a widely patent left iliac stent, occluded left SFA with two-vessel runoff and a occluded right SFA with two-vessel runoff. I did not think he had percutaneous options for revascularization and he therefore underwent right femoropopliteal bypass grafting by Dr. Serene in March of this year. He's been reinjured them twice demonstrating a patent graft despite the fact that his symptoms have gotten worse. He is also had a reverse right shoulder replacement by Dr. Kay in June of this 2019.    He underwent aortobifemoral bypass grafting by Dr. Serene but has had minimal benefit with regards to claudication.  He has had a PERC drain put in his gallbladder which has had for  the last 8 months and recent mission for a gastrointestinal infectious illness.  He denies chest pain or shortness of breath.  He is scheduled for potential cholecystectomy in the upcoming future  which she will be cleared for at low risk given his recent low risk Myoview  in December and normal 2D echocardiogram.   Since I saw him a year ago he is remained cardiovascular stable.  He denies chest pain, shortness of breath.  He does continue to have claudication however.  He complains of weakness in both of his extremities and now is walking with a walker because of that as well as because of newly developed balance issues.  He did have graft surveillance done by Dr. Pearline on 05/08/2023 revealing a right ABI of 0.66 and a left of 0.61 with an occluded right SFA and right femoropopliteal bypass graft.  He persistently tachycardic and is somewhat hypotensive today.   Current Meds  Medication Sig   clopidogrel  (PLAVIX ) 75 MG tablet TAKE 1 TABLET DAILY   ferrous sulfate  325 (65 FE) MG tablet Take 325 mg by mouth daily.   JARDIANCE 10 MG TABS tablet Oral for 90 Days   lisinopril  (ZESTRIL ) 5 MG tablet Take 5 mg by mouth 2 (two) times daily.   metoprolol  tartrate (LOPRESSOR ) 50 MG tablet Take 1 tablet (50 mg total) by mouth 2 (two) times daily.   MYRBETRIQ  50 MG TB24 tablet Take 50 mg by mouth daily.   pantoprazole  (PROTONIX ) 40 MG tablet Take 1 tablet (40 mg total) by mouth 2 (two) times daily.   polyethylene glycol (MIRALAX  / GLYCOLAX ) 17 g packet Take 17 g by mouth daily as needed for mild constipation.   rosuvastatin  (CRESTOR ) 20 MG tablet Take 20 mg by mouth every evening.    traMADol  (ULTRAM ) 50 MG tablet Take 1 tablet (50 mg total) by mouth every 12 (twelve) hours as needed.   UNABLE TO FIND Take 1 tablet by mouth daily as needed (solidify bowel movements). Med Name: [place]   [DISCONTINUED] amLODipine  (NORVASC ) 10 MG tablet Take 1 tablet (10 mg total) by mouth daily.   [DISCONTINUED] metFORMIN  (GLUCOPHAGE ) 1000 MG tablet Take 500 mg by mouth 2 (two) times daily.     Allergies  Allergen Reactions   Dorethia Cola ] Other (See Comments)    GI bleeding   Gadolinium Derivatives Itching,  Swelling and Other (See Comments)    Pt complained of face flushing/hottness and throat tightness/scratchiness immediately after the injections.  Within 4 minutes, all symptoms were gone and the study was completed.  No further complications or signs of allergy were exhibited after completion of study.    Iodinated Contrast Media     Other Reaction(s): Not available   Pneumococcal 13-Val Conj Vacc     Other Reaction(s): achiness all over, dizziness, nausea, weakness   Scopolamine      Other Reaction(s): Delerium   Oxycodone -Acetaminophen  Other (See Comments)    Dizziness and feeling of being uncomfortable     Social History   Socioeconomic History   Marital status: Married    Spouse name: Not on file   Number of children: 2   Years of education: Not on file   Highest education level: Not on file  Occupational History   Occupation: retired    Associate Professor: RETIRED  Tobacco Use   Smoking status: Former    Current packs/day: 0.00    Types: Cigarettes    Start date: 06/1963    Quit date: 06/1988    Years since  quitting: 35.0   Smokeless tobacco: Never  Vaping Use   Vaping status: Never Used  Substance and Sexual Activity   Alcohol  use: Yes    Comment: rarely   Drug use: No   Sexual activity: Not on file  Other Topics Concern   Not on file  Social History Narrative   Not on file   Social Drivers of Health   Financial Resource Strain: Not on file  Food Insecurity: No Food Insecurity (05/07/2023)   Hunger Vital Sign    Worried About Running Out of Food in the Last Year: Never true    Ran Out of Food in the Last Year: Never true  Transportation Needs: No Transportation Needs (05/07/2023)   PRAPARE - Administrator, Civil Service (Medical): No    Lack of Transportation (Non-Medical): No  Physical Activity: Not on file  Stress: Not on file  Social Connections: Unknown (09/04/2022)   Received from [facility]   Social Network    Social Network: Not  on file  Intimate Partner Violence: Not At Risk (05/07/2023)   Humiliation, Afraid, Rape, and Kick questionnaire    Fear of Current or Ex-Partner: No    Emotionally Abused: No    Physically Abused: No    Sexually Abused: No     Review of Systems: General: negative for chills, fever, night sweats or weight changes.  Cardiovascular: negative for chest pain, dyspnea on exertion, edema, orthopnea, palpitations, paroxysmal nocturnal dyspnea or shortness of breath Dermatological: negative for rash Respiratory: negative for cough or wheezing Urologic: negative for hematuria Abdominal: negative for nausea, vomiting, diarrhea, bright red blood per rectum, melena, or hematemesis Neurologic: negative for visual changes, syncope, or dizziness All other systems reviewed and are otherwise negative except as noted above.    Blood pressure (!) 90/44, pulse (!) 121, height 5' 5 (1.651 m), weight 144 lb (65.3 kg), SpO2 94%.  General appearance: alert and no distress Neck: no adenopathy, no JVD, supple, symmetrical, trachea midline, thyroid  not enlarged, symmetric, no tenderness/mass/nodules, and bilateral carotid bruits right lateral to left Lungs: clear to auscultation bilaterally Heart: regular rate and rhythm, S1, S2 normal, no murmur, click, rub or gallop Extremities: extremities normal, atraumatic, no cyanosis or edema Pulses: Absent pedal pulses Skin: Skin color, texture, turgor normal. No rashes or lesions Neurologic: Grossly normal  EKG EKG Interpretation Date/Time:  Monday July 05 2023 11:41:55 EST Ventricular Rate:  121 PR Interval:  122 QRS Duration:  130 QT Interval:  348 QTC Calculation: 494 R Axis:   169  Text Interpretation: Sinus tachycardia with Premature atrial complexes Right bundle branch block When compared with ECG of 07-May-2023 12:37, PREVIOUS ECG IS PRESENT Confirmed by Court Carrier [phone]) on 07/05/2023 11:52:10 AM    ASSESSMENT AND PLAN:   Essential  hypertension History of essential hypertension her blood pressure measured today at 90/44.  He apparently still is on lisinopril  although this is not listed on his medication list.  His list says that he is on amlodipine  which she denies being on.  He also is on metoprolol  50 mg p.o. twice daily.  His heart rate today is 125 for unclear reasons although a year ago his heart rate was 112.  PAD (peripheral artery disease) (HCC) History of PAD status post remote left iliac stenting 20 years ago.  I angiogram him 05/12/2010 revealing intact left common iliac artery stent with a high-grade left external iliac artery stenosis which I stented.  I also put 2  stents in his right SFA.  He has a known occluded left SFA.  I restarted him 05/14/2014 revealing patent iliac stents, occluded left SFA and in-stent restenosis within the right SFA stent.  I performed drug-eluting balloon angioplasty with significant improvement.  I read angiogram to him 04/20/2016 revealing an occluded right SFA and ultimately requiring right femoropopliteal bypass grafting by Dr. Serene as well as aortobifemoral bypass grafting.  His most recent graft ultrasound performed 05/08/2023 revealed a right ABI of 0.66 and a left of 0.61 with an occluded and occluded right SFA.  Does complain of right lower lobe greater than left lower extremity claudication.  Hyperlipidemia History of hyperlipidemia on statin therapy with lipid profile performed 07/13/2022 revealing a total cholesterol 122, LDL 53 HDL 37.  Carotid artery disease (HCC) History of carotid artery disease with Dopplers performed 07/29/2022 revealing moderate right ICA stenosis.  This will be repeated on an annual basis.  Sinus tachycardia Chronic, unclear etiology.     Dorn DOROTHA Lesches MD FACP,FACC,FAHA, [facility] 07/05/2023 12:08 PM

## 2023-07-05 NOTE — Assessment & Plan Note (Signed)
 History of carotid artery disease with Dopplers performed 07/29/2022 revealing moderate right ICA stenosis.  This will be repeated on an annual basis.

## 2023-07-05 NOTE — Patient Instructions (Signed)

## 2023-07-06 DIAGNOSIS — J189 Pneumonia, unspecified organism: Secondary | ICD-10-CM | POA: Diagnosis not present

## 2023-07-06 DIAGNOSIS — E1122 Type 2 diabetes mellitus with diabetic chronic kidney disease: Secondary | ICD-10-CM | POA: Diagnosis not present

## 2023-07-06 DIAGNOSIS — I129 Hypertensive chronic kidney disease with stage 1 through stage 4 chronic kidney disease, or unspecified chronic kidney disease: Secondary | ICD-10-CM | POA: Diagnosis not present

## 2023-07-06 DIAGNOSIS — I6521 Occlusion and stenosis of right carotid artery: Secondary | ICD-10-CM | POA: Diagnosis not present

## 2023-07-06 DIAGNOSIS — G894 Chronic pain syndrome: Secondary | ICD-10-CM | POA: Diagnosis not present

## 2023-07-06 DIAGNOSIS — E1151 Type 2 diabetes mellitus with diabetic peripheral angiopathy without gangrene: Secondary | ICD-10-CM | POA: Diagnosis not present

## 2023-07-06 DIAGNOSIS — N1832 Chronic kidney disease, stage 3b: Secondary | ICD-10-CM | POA: Diagnosis not present

## 2023-07-07 DIAGNOSIS — E1122 Type 2 diabetes mellitus with diabetic chronic kidney disease: Secondary | ICD-10-CM | POA: Diagnosis not present

## 2023-07-07 DIAGNOSIS — I6521 Occlusion and stenosis of right carotid artery: Secondary | ICD-10-CM | POA: Diagnosis not present

## 2023-07-07 DIAGNOSIS — E1151 Type 2 diabetes mellitus with diabetic peripheral angiopathy without gangrene: Secondary | ICD-10-CM | POA: Diagnosis not present

## 2023-07-07 DIAGNOSIS — I129 Hypertensive chronic kidney disease with stage 1 through stage 4 chronic kidney disease, or unspecified chronic kidney disease: Secondary | ICD-10-CM | POA: Diagnosis not present

## 2023-07-07 DIAGNOSIS — N1832 Chronic kidney disease, stage 3b: Secondary | ICD-10-CM | POA: Diagnosis not present

## 2023-07-07 DIAGNOSIS — G894 Chronic pain syndrome: Secondary | ICD-10-CM | POA: Diagnosis not present

## 2023-07-13 DIAGNOSIS — N1832 Chronic kidney disease, stage 3b: Secondary | ICD-10-CM | POA: Diagnosis not present

## 2023-07-13 DIAGNOSIS — I129 Hypertensive chronic kidney disease with stage 1 through stage 4 chronic kidney disease, or unspecified chronic kidney disease: Secondary | ICD-10-CM | POA: Diagnosis not present

## 2023-07-13 DIAGNOSIS — I6521 Occlusion and stenosis of right carotid artery: Secondary | ICD-10-CM | POA: Diagnosis not present

## 2023-07-13 DIAGNOSIS — E1122 Type 2 diabetes mellitus with diabetic chronic kidney disease: Secondary | ICD-10-CM | POA: Diagnosis not present

## 2023-07-13 DIAGNOSIS — G894 Chronic pain syndrome: Secondary | ICD-10-CM | POA: Diagnosis not present

## 2023-07-13 DIAGNOSIS — E1151 Type 2 diabetes mellitus with diabetic peripheral angiopathy without gangrene: Secondary | ICD-10-CM | POA: Diagnosis not present

## 2023-07-22 DIAGNOSIS — G894 Chronic pain syndrome: Secondary | ICD-10-CM | POA: Diagnosis not present

## 2023-07-22 DIAGNOSIS — N1832 Chronic kidney disease, stage 3b: Secondary | ICD-10-CM | POA: Diagnosis not present

## 2023-07-22 DIAGNOSIS — E1122 Type 2 diabetes mellitus with diabetic chronic kidney disease: Secondary | ICD-10-CM | POA: Diagnosis not present

## 2023-07-22 DIAGNOSIS — E1151 Type 2 diabetes mellitus with diabetic peripheral angiopathy without gangrene: Secondary | ICD-10-CM | POA: Diagnosis not present

## 2023-07-22 DIAGNOSIS — I129 Hypertensive chronic kidney disease with stage 1 through stage 4 chronic kidney disease, or unspecified chronic kidney disease: Secondary | ICD-10-CM | POA: Diagnosis not present

## 2023-07-22 DIAGNOSIS — I6521 Occlusion and stenosis of right carotid artery: Secondary | ICD-10-CM | POA: Diagnosis not present

## 2023-07-30 ENCOUNTER — Ambulatory Visit (HOSPITAL_COMMUNITY)
Admission: RE | Admit: 2023-07-30 | Discharge: 2023-07-30 | Disposition: A | Discharge status: Home / Self Care | Payer: Medicare Other | Source: Ambulatory Visit | Attending: Cardiovascular Disease | Admitting: Cardiovascular Disease

## 2023-07-30 DIAGNOSIS — I6521 Occlusion and stenosis of right carotid artery: Secondary | ICD-10-CM | POA: Diagnosis not present

## 2023-08-20 DIAGNOSIS — N1832 Chronic kidney disease, stage 3b: Secondary | ICD-10-CM | POA: Diagnosis not present

## 2023-08-20 DIAGNOSIS — I129 Hypertensive chronic kidney disease with stage 1 through stage 4 chronic kidney disease, or unspecified chronic kidney disease: Secondary | ICD-10-CM | POA: Diagnosis not present

## 2023-08-20 DIAGNOSIS — D649 Anemia, unspecified: Secondary | ICD-10-CM | POA: Diagnosis not present

## 2023-08-20 DIAGNOSIS — Z1389 Encounter for screening for other disorder: Secondary | ICD-10-CM | POA: Diagnosis not present

## 2023-08-20 DIAGNOSIS — Z125 Encounter for screening for malignant neoplasm of prostate: Secondary | ICD-10-CM | POA: Diagnosis not present

## 2023-08-20 DIAGNOSIS — E1151 Type 2 diabetes mellitus with diabetic peripheral angiopathy without gangrene: Secondary | ICD-10-CM | POA: Diagnosis not present

## 2023-08-20 DIAGNOSIS — E785 Hyperlipidemia, unspecified: Secondary | ICD-10-CM | POA: Diagnosis not present

## 2023-08-25 DIAGNOSIS — I2510 Atherosclerotic heart disease of native coronary artery without angina pectoris: Secondary | ICD-10-CM | POA: Diagnosis not present

## 2023-08-25 DIAGNOSIS — Z1152 Encounter for screening for COVID-19: Secondary | ICD-10-CM | POA: Diagnosis not present

## 2023-08-25 DIAGNOSIS — J189 Pneumonia, unspecified organism: Secondary | ICD-10-CM | POA: Diagnosis not present

## 2023-08-25 DIAGNOSIS — R2681 Unsteadiness on feet: Secondary | ICD-10-CM | POA: Diagnosis not present

## 2023-08-25 DIAGNOSIS — I739 Peripheral vascular disease, unspecified: Secondary | ICD-10-CM | POA: Diagnosis not present

## 2023-08-25 DIAGNOSIS — K5900 Constipation, unspecified: Secondary | ICD-10-CM | POA: Diagnosis not present

## 2023-08-25 DIAGNOSIS — E785 Hyperlipidemia, unspecified: Secondary | ICD-10-CM | POA: Diagnosis not present

## 2023-08-25 DIAGNOSIS — N3941 Urge incontinence: Secondary | ICD-10-CM | POA: Diagnosis not present

## 2023-08-25 DIAGNOSIS — Z1331 Encounter for screening for depression: Secondary | ICD-10-CM | POA: Diagnosis not present

## 2023-08-25 DIAGNOSIS — R82998 Other abnormal findings in urine: Secondary | ICD-10-CM | POA: Diagnosis not present

## 2023-08-25 DIAGNOSIS — M4810 Ankylosing hyperostosis [Forestier], site unspecified: Secondary | ICD-10-CM | POA: Diagnosis not present

## 2023-08-25 DIAGNOSIS — E1151 Type 2 diabetes mellitus with diabetic peripheral angiopathy without gangrene: Secondary | ICD-10-CM | POA: Diagnosis not present

## 2023-08-25 DIAGNOSIS — N1832 Chronic kidney disease, stage 3b: Secondary | ICD-10-CM | POA: Diagnosis not present

## 2023-08-25 DIAGNOSIS — I129 Hypertensive chronic kidney disease with stage 1 through stage 4 chronic kidney disease, or unspecified chronic kidney disease: Secondary | ICD-10-CM | POA: Diagnosis not present

## 2023-08-25 DIAGNOSIS — H548 Legal blindness, as defined in USA: Secondary | ICD-10-CM | POA: Diagnosis not present

## 2023-08-25 DIAGNOSIS — Z0000 Encounter for general adult medical examination without abnormal findings: Secondary | ICD-10-CM | POA: Diagnosis not present

## 2023-09-06 DIAGNOSIS — I739 Peripheral vascular disease, unspecified: Secondary | ICD-10-CM | POA: Diagnosis not present

## 2023-09-06 DIAGNOSIS — M2041 Other hammer toe(s) (acquired), right foot: Secondary | ICD-10-CM | POA: Diagnosis not present

## 2023-09-06 DIAGNOSIS — B351 Tinea unguium: Secondary | ICD-10-CM | POA: Diagnosis not present

## 2023-09-06 DIAGNOSIS — G629 Polyneuropathy, unspecified: Secondary | ICD-10-CM | POA: Diagnosis not present

## 2023-09-06 DIAGNOSIS — L603 Nail dystrophy: Secondary | ICD-10-CM | POA: Diagnosis not present

## 2023-09-06 DIAGNOSIS — E1142 Type 2 diabetes mellitus with diabetic polyneuropathy: Secondary | ICD-10-CM | POA: Diagnosis not present

## 2023-09-21 DIAGNOSIS — N1832 Chronic kidney disease, stage 3b: Secondary | ICD-10-CM | POA: Diagnosis not present

## 2023-09-27 DIAGNOSIS — E875 Hyperkalemia: Secondary | ICD-10-CM | POA: Diagnosis not present

## 2023-09-27 DIAGNOSIS — E559 Vitamin D deficiency, unspecified: Secondary | ICD-10-CM | POA: Diagnosis not present

## 2023-09-27 DIAGNOSIS — D631 Anemia in chronic kidney disease: Secondary | ICD-10-CM | POA: Diagnosis not present

## 2023-09-27 DIAGNOSIS — Q6102 Congenital multiple renal cysts: Secondary | ICD-10-CM | POA: Diagnosis not present

## 2023-09-27 DIAGNOSIS — E1122 Type 2 diabetes mellitus with diabetic chronic kidney disease: Secondary | ICD-10-CM | POA: Diagnosis not present

## 2023-09-27 DIAGNOSIS — I129 Hypertensive chronic kidney disease with stage 1 through stage 4 chronic kidney disease, or unspecified chronic kidney disease: Secondary | ICD-10-CM | POA: Diagnosis not present

## 2023-09-27 DIAGNOSIS — R32 Unspecified urinary incontinence: Secondary | ICD-10-CM | POA: Diagnosis not present

## 2023-09-27 DIAGNOSIS — N184 Chronic kidney disease, stage 4 (severe): Secondary | ICD-10-CM | POA: Diagnosis not present

## 2023-09-27 DIAGNOSIS — N189 Chronic kidney disease, unspecified: Secondary | ICD-10-CM | POA: Diagnosis not present

## 2023-10-02 ENCOUNTER — Emergency Department (HOSPITAL_COMMUNITY)

## 2023-10-02 ENCOUNTER — Encounter (HOSPITAL_COMMUNITY): Payer: Self-pay

## 2023-10-02 ENCOUNTER — Inpatient Hospital Stay (HOSPITAL_COMMUNITY)
Admission: EM | Admit: 2023-10-02 | Discharge: 2023-10-18 | DRG: 471 | Disposition: A | Discharge status: Rehabilitation Facility | Attending: Internal Medicine | Admitting: Internal Medicine

## 2023-10-02 DIAGNOSIS — E1151 Type 2 diabetes mellitus with diabetic peripheral angiopathy without gangrene: Secondary | ICD-10-CM | POA: Diagnosis present

## 2023-10-02 DIAGNOSIS — I951 Orthostatic hypotension: Secondary | ICD-10-CM | POA: Diagnosis not present

## 2023-10-02 DIAGNOSIS — R58 Hemorrhage, not elsewhere classified: Secondary | ICD-10-CM | POA: Diagnosis not present

## 2023-10-02 DIAGNOSIS — M19012 Primary osteoarthritis, left shoulder: Secondary | ICD-10-CM | POA: Diagnosis not present

## 2023-10-02 DIAGNOSIS — I130 Hypertensive heart and chronic kidney disease with heart failure and stage 1 through stage 4 chronic kidney disease, or unspecified chronic kidney disease: Secondary | ICD-10-CM | POA: Diagnosis present

## 2023-10-02 DIAGNOSIS — M50022 Cervical disc disorder at C5-C6 level with myelopathy: Secondary | ICD-10-CM | POA: Diagnosis not present

## 2023-10-02 DIAGNOSIS — Z66 Do not resuscitate: Secondary | ICD-10-CM | POA: Diagnosis present

## 2023-10-02 DIAGNOSIS — M542 Cervicalgia: Secondary | ICD-10-CM

## 2023-10-02 DIAGNOSIS — M47812 Spondylosis without myelopathy or radiculopathy, cervical region: Secondary | ICD-10-CM | POA: Diagnosis not present

## 2023-10-02 DIAGNOSIS — R000 Tachycardia, unspecified: Secondary | ICD-10-CM | POA: Diagnosis present

## 2023-10-02 DIAGNOSIS — Z91041 Radiographic dye allergy status: Secondary | ICD-10-CM

## 2023-10-02 DIAGNOSIS — M7501 Adhesive capsulitis of right shoulder: Secondary | ICD-10-CM | POA: Diagnosis present

## 2023-10-02 DIAGNOSIS — L89616 Pressure-induced deep tissue damage of right heel: Secondary | ICD-10-CM | POA: Diagnosis not present

## 2023-10-02 DIAGNOSIS — N184 Chronic kidney disease, stage 4 (severe): Secondary | ICD-10-CM | POA: Diagnosis present

## 2023-10-02 DIAGNOSIS — M4322 Fusion of spine, cervical region: Secondary | ICD-10-CM | POA: Diagnosis not present

## 2023-10-02 DIAGNOSIS — Z95820 Peripheral vascular angioplasty status with implants and grafts: Secondary | ICD-10-CM | POA: Diagnosis not present

## 2023-10-02 DIAGNOSIS — Z981 Arthrodesis status: Secondary | ICD-10-CM | POA: Diagnosis not present

## 2023-10-02 DIAGNOSIS — W19XXXD Unspecified fall, subsequent encounter: Secondary | ICD-10-CM | POA: Diagnosis present

## 2023-10-02 DIAGNOSIS — G928 Other toxic encephalopathy: Secondary | ICD-10-CM | POA: Diagnosis not present

## 2023-10-02 DIAGNOSIS — Z043 Encounter for examination and observation following other accident: Secondary | ICD-10-CM | POA: Diagnosis not present

## 2023-10-02 DIAGNOSIS — M488X2 Other specified spondylopathies, cervical region: Secondary | ICD-10-CM | POA: Diagnosis present

## 2023-10-02 DIAGNOSIS — F54 Psychological and behavioral factors associated with disorders or diseases classified elsewhere: Secondary | ICD-10-CM | POA: Diagnosis not present

## 2023-10-02 DIAGNOSIS — R52 Pain, unspecified: Secondary | ICD-10-CM | POA: Diagnosis not present

## 2023-10-02 DIAGNOSIS — I10 Essential (primary) hypertension: Secondary | ICD-10-CM | POA: Diagnosis not present

## 2023-10-02 DIAGNOSIS — Z4682 Encounter for fitting and adjustment of non-vascular catheter: Secondary | ICD-10-CM | POA: Diagnosis not present

## 2023-10-02 DIAGNOSIS — M79604 Pain in right leg: Secondary | ICD-10-CM | POA: Diagnosis present

## 2023-10-02 DIAGNOSIS — R443 Hallucinations, unspecified: Secondary | ICD-10-CM | POA: Diagnosis not present

## 2023-10-02 DIAGNOSIS — S0990XA Unspecified injury of head, initial encounter: Secondary | ICD-10-CM | POA: Diagnosis not present

## 2023-10-02 DIAGNOSIS — F418 Other specified anxiety disorders: Secondary | ICD-10-CM | POA: Diagnosis not present

## 2023-10-02 DIAGNOSIS — Z515 Encounter for palliative care: Secondary | ICD-10-CM | POA: Diagnosis not present

## 2023-10-02 DIAGNOSIS — R9431 Abnormal electrocardiogram [ECG] [EKG]: Secondary | ICD-10-CM | POA: Diagnosis present

## 2023-10-02 DIAGNOSIS — M25512 Pain in left shoulder: Secondary | ICD-10-CM | POA: Diagnosis not present

## 2023-10-02 DIAGNOSIS — N319 Neuromuscular dysfunction of bladder, unspecified: Secondary | ICD-10-CM | POA: Diagnosis present

## 2023-10-02 DIAGNOSIS — F05 Delirium due to known physiological condition: Secondary | ICD-10-CM | POA: Diagnosis not present

## 2023-10-02 DIAGNOSIS — G959 Disease of spinal cord, unspecified: Secondary | ICD-10-CM | POA: Diagnosis not present

## 2023-10-02 DIAGNOSIS — R339 Retention of urine, unspecified: Secondary | ICD-10-CM | POA: Diagnosis not present

## 2023-10-02 DIAGNOSIS — Y93E8 Activity, other personal hygiene: Secondary | ICD-10-CM

## 2023-10-02 DIAGNOSIS — Z7902 Long term (current) use of antithrombotics/antiplatelets: Secondary | ICD-10-CM

## 2023-10-02 DIAGNOSIS — I708 Atherosclerosis of other arteries: Secondary | ICD-10-CM | POA: Diagnosis present

## 2023-10-02 DIAGNOSIS — M79605 Pain in left leg: Secondary | ICD-10-CM | POA: Diagnosis present

## 2023-10-02 DIAGNOSIS — E1122 Type 2 diabetes mellitus with diabetic chronic kidney disease: Secondary | ICD-10-CM | POA: Diagnosis not present

## 2023-10-02 DIAGNOSIS — I6782 Cerebral ischemia: Secondary | ICD-10-CM | POA: Diagnosis not present

## 2023-10-02 DIAGNOSIS — I5032 Chronic diastolic (congestive) heart failure: Secondary | ICD-10-CM | POA: Diagnosis present

## 2023-10-02 DIAGNOSIS — E785 Hyperlipidemia, unspecified: Secondary | ICD-10-CM | POA: Diagnosis present

## 2023-10-02 DIAGNOSIS — N179 Acute kidney failure, unspecified: Secondary | ICD-10-CM | POA: Diagnosis not present

## 2023-10-02 DIAGNOSIS — Z8249 Family history of ischemic heart disease and other diseases of the circulatory system: Secondary | ICD-10-CM

## 2023-10-02 DIAGNOSIS — E1165 Type 2 diabetes mellitus with hyperglycemia: Secondary | ICD-10-CM | POA: Diagnosis present

## 2023-10-02 DIAGNOSIS — G8929 Other chronic pain: Secondary | ICD-10-CM | POA: Diagnosis present

## 2023-10-02 DIAGNOSIS — M24511 Contracture, right shoulder: Secondary | ICD-10-CM | POA: Diagnosis not present

## 2023-10-02 DIAGNOSIS — R1310 Dysphagia, unspecified: Secondary | ICD-10-CM | POA: Diagnosis not present

## 2023-10-02 DIAGNOSIS — M2578 Osteophyte, vertebrae: Secondary | ICD-10-CM | POA: Diagnosis present

## 2023-10-02 DIAGNOSIS — G8254 Quadriplegia, C5-C7 incomplete: Secondary | ICD-10-CM | POA: Diagnosis not present

## 2023-10-02 DIAGNOSIS — I615 Nontraumatic intracerebral hemorrhage, intraventricular: Secondary | ICD-10-CM | POA: Diagnosis not present

## 2023-10-02 DIAGNOSIS — K219 Gastro-esophageal reflux disease without esophagitis: Secondary | ICD-10-CM | POA: Diagnosis present

## 2023-10-02 DIAGNOSIS — Z4789 Encounter for other orthopedic aftercare: Secondary | ICD-10-CM | POA: Diagnosis not present

## 2023-10-02 DIAGNOSIS — F32A Depression, unspecified: Secondary | ICD-10-CM | POA: Diagnosis present

## 2023-10-02 DIAGNOSIS — D72829 Elevated white blood cell count, unspecified: Secondary | ICD-10-CM | POA: Diagnosis present

## 2023-10-02 DIAGNOSIS — R2681 Unsteadiness on feet: Secondary | ICD-10-CM

## 2023-10-02 DIAGNOSIS — R262 Difficulty in walking, not elsewhere classified: Secondary | ICD-10-CM | POA: Diagnosis not present

## 2023-10-02 DIAGNOSIS — Z87891 Personal history of nicotine dependence: Secondary | ICD-10-CM

## 2023-10-02 DIAGNOSIS — J439 Emphysema, unspecified: Secondary | ICD-10-CM | POA: Diagnosis not present

## 2023-10-02 DIAGNOSIS — M4712 Other spondylosis with myelopathy, cervical region: Secondary | ICD-10-CM | POA: Diagnosis not present

## 2023-10-02 DIAGNOSIS — M799 Soft tissue disorder, unspecified: Secondary | ICD-10-CM | POA: Diagnosis not present

## 2023-10-02 DIAGNOSIS — E43 Unspecified severe protein-calorie malnutrition: Secondary | ICD-10-CM | POA: Diagnosis not present

## 2023-10-02 DIAGNOSIS — Z7189 Other specified counseling: Secondary | ICD-10-CM | POA: Diagnosis not present

## 2023-10-02 DIAGNOSIS — Z806 Family history of leukemia: Secondary | ICD-10-CM

## 2023-10-02 DIAGNOSIS — D631 Anemia in chronic kidney disease: Secondary | ICD-10-CM | POA: Diagnosis present

## 2023-10-02 DIAGNOSIS — R0989 Other specified symptoms and signs involving the circulatory and respiratory systems: Secondary | ICD-10-CM | POA: Diagnosis not present

## 2023-10-02 DIAGNOSIS — R739 Hyperglycemia, unspecified: Secondary | ICD-10-CM | POA: Diagnosis not present

## 2023-10-02 DIAGNOSIS — F4323 Adjustment disorder with mixed anxiety and depressed mood: Secondary | ICD-10-CM | POA: Diagnosis not present

## 2023-10-02 DIAGNOSIS — Z794 Long term (current) use of insulin: Secondary | ICD-10-CM | POA: Diagnosis not present

## 2023-10-02 DIAGNOSIS — R296 Repeated falls: Secondary | ICD-10-CM | POA: Diagnosis present

## 2023-10-02 DIAGNOSIS — R54 Age-related physical debility: Secondary | ICD-10-CM | POA: Diagnosis present

## 2023-10-02 DIAGNOSIS — R410 Disorientation, unspecified: Secondary | ICD-10-CM | POA: Diagnosis not present

## 2023-10-02 DIAGNOSIS — Z833 Family history of diabetes mellitus: Secondary | ICD-10-CM

## 2023-10-02 DIAGNOSIS — Z7984 Long term (current) use of oral hypoglycemic drugs: Secondary | ICD-10-CM

## 2023-10-02 DIAGNOSIS — S299XXA Unspecified injury of thorax, initial encounter: Secondary | ICD-10-CM | POA: Diagnosis not present

## 2023-10-02 DIAGNOSIS — Y92013 Bedroom of single-family (private) house as the place of occurrence of the external cause: Secondary | ICD-10-CM

## 2023-10-02 DIAGNOSIS — F11921 Opioid use, unspecified with intoxication delirium: Secondary | ICD-10-CM | POA: Diagnosis not present

## 2023-10-02 DIAGNOSIS — T50915A Adverse effect of multiple unspecified drugs, medicaments and biological substances, initial encounter: Secondary | ICD-10-CM | POA: Diagnosis not present

## 2023-10-02 DIAGNOSIS — S50811A Abrasion of right forearm, initial encounter: Secondary | ICD-10-CM | POA: Diagnosis present

## 2023-10-02 DIAGNOSIS — Z860100 Personal history of colon polyps, unspecified: Secondary | ICD-10-CM

## 2023-10-02 DIAGNOSIS — W19XXXA Unspecified fall, initial encounter: Secondary | ICD-10-CM | POA: Diagnosis not present

## 2023-10-02 DIAGNOSIS — Z800 Family history of malignant neoplasm of digestive organs: Secondary | ICD-10-CM

## 2023-10-02 DIAGNOSIS — I129 Hypertensive chronic kidney disease with stage 1 through stage 4 chronic kidney disease, or unspecified chronic kidney disease: Secondary | ICD-10-CM | POA: Diagnosis not present

## 2023-10-02 DIAGNOSIS — I959 Hypotension, unspecified: Secondary | ICD-10-CM | POA: Diagnosis present

## 2023-10-02 DIAGNOSIS — Z886 Allergy status to analgesic agent status: Secondary | ICD-10-CM

## 2023-10-02 DIAGNOSIS — Z888 Allergy status to other drugs, medicaments and biological substances status: Secondary | ICD-10-CM

## 2023-10-02 DIAGNOSIS — W1839XA Other fall on same level, initial encounter: Secondary | ICD-10-CM | POA: Diagnosis present

## 2023-10-02 DIAGNOSIS — M4802 Spinal stenosis, cervical region: Secondary | ICD-10-CM | POA: Diagnosis not present

## 2023-10-02 DIAGNOSIS — Z79899 Other long term (current) drug therapy: Secondary | ICD-10-CM

## 2023-10-02 DIAGNOSIS — M549 Dorsalgia, unspecified: Secondary | ICD-10-CM | POA: Diagnosis not present

## 2023-10-02 DIAGNOSIS — Z96611 Presence of right artificial shoulder joint: Secondary | ICD-10-CM | POA: Diagnosis not present

## 2023-10-02 DIAGNOSIS — Y846 Urinary catheterization as the cause of abnormal reaction of the patient, or of later complication, without mention of misadventure at the time of the procedure: Secondary | ICD-10-CM | POA: Diagnosis not present

## 2023-10-02 DIAGNOSIS — M19031 Primary osteoarthritis, right wrist: Secondary | ICD-10-CM | POA: Diagnosis not present

## 2023-10-02 DIAGNOSIS — I70203 Unspecified atherosclerosis of native arteries of extremities, bilateral legs: Secondary | ICD-10-CM | POA: Diagnosis present

## 2023-10-02 DIAGNOSIS — I452 Bifascicular block: Secondary | ICD-10-CM | POA: Diagnosis present

## 2023-10-02 DIAGNOSIS — R32 Unspecified urinary incontinence: Secondary | ICD-10-CM | POA: Diagnosis present

## 2023-10-02 DIAGNOSIS — N1832 Chronic kidney disease, stage 3b: Secondary | ICD-10-CM | POA: Diagnosis not present

## 2023-10-02 DIAGNOSIS — G8250 Quadriplegia, unspecified: Secondary | ICD-10-CM | POA: Diagnosis not present

## 2023-10-02 DIAGNOSIS — M160 Bilateral primary osteoarthritis of hip: Secondary | ICD-10-CM | POA: Diagnosis not present

## 2023-10-02 DIAGNOSIS — J9811 Atelectasis: Secondary | ICD-10-CM | POA: Diagnosis present

## 2023-10-02 DIAGNOSIS — M50223 Other cervical disc displacement at C6-C7 level: Secondary | ICD-10-CM | POA: Diagnosis not present

## 2023-10-02 DIAGNOSIS — I609 Nontraumatic subarachnoid hemorrhage, unspecified: Secondary | ICD-10-CM | POA: Diagnosis not present

## 2023-10-02 DIAGNOSIS — Z885 Allergy status to narcotic agent status: Secondary | ICD-10-CM

## 2023-10-02 DIAGNOSIS — G9520 Unspecified cord compression: Secondary | ICD-10-CM | POA: Diagnosis not present

## 2023-10-02 DIAGNOSIS — K592 Neurogenic bowel, not elsewhere classified: Secondary | ICD-10-CM | POA: Diagnosis not present

## 2023-10-02 DIAGNOSIS — S3730XA Unspecified injury of urethra, initial encounter: Secondary | ICD-10-CM | POA: Diagnosis not present

## 2023-10-02 DIAGNOSIS — T148XXA Other injury of unspecified body region, initial encounter: Secondary | ICD-10-CM | POA: Diagnosis present

## 2023-10-02 DIAGNOSIS — S139XXA Sprain of joints and ligaments of unspecified parts of neck, initial encounter: Secondary | ICD-10-CM | POA: Diagnosis not present

## 2023-10-02 DIAGNOSIS — L89152 Pressure ulcer of sacral region, stage 2: Secondary | ICD-10-CM | POA: Diagnosis not present

## 2023-10-02 DIAGNOSIS — W2201XD Walked into wall, subsequent encounter: Secondary | ICD-10-CM | POA: Diagnosis not present

## 2023-10-02 DIAGNOSIS — H3530 Unspecified macular degeneration: Secondary | ICD-10-CM | POA: Diagnosis present

## 2023-10-02 DIAGNOSIS — S199XXA Unspecified injury of neck, initial encounter: Secondary | ICD-10-CM | POA: Diagnosis not present

## 2023-10-02 DIAGNOSIS — H548 Legal blindness, as defined in USA: Secondary | ICD-10-CM | POA: Diagnosis present

## 2023-10-02 DIAGNOSIS — E1142 Type 2 diabetes mellitus with diabetic polyneuropathy: Secondary | ICD-10-CM | POA: Diagnosis not present

## 2023-10-02 DIAGNOSIS — N368 Other specified disorders of urethra: Secondary | ICD-10-CM | POA: Diagnosis not present

## 2023-10-02 DIAGNOSIS — I672 Cerebral atherosclerosis: Secondary | ICD-10-CM | POA: Diagnosis not present

## 2023-10-02 DIAGNOSIS — M19021 Primary osteoarthritis, right elbow: Secondary | ICD-10-CM | POA: Diagnosis not present

## 2023-10-02 DIAGNOSIS — Z887 Allergy status to serum and vaccine status: Secondary | ICD-10-CM

## 2023-10-02 DIAGNOSIS — R252 Cramp and spasm: Secondary | ICD-10-CM | POA: Diagnosis not present

## 2023-10-02 DIAGNOSIS — M79631 Pain in right forearm: Secondary | ICD-10-CM | POA: Diagnosis not present

## 2023-10-02 DIAGNOSIS — S51811A Laceration without foreign body of right forearm, initial encounter: Secondary | ICD-10-CM | POA: Diagnosis present

## 2023-10-02 LAB — I-STAT CHEM 8, ED
BUN: 42 mg/dL — ABNORMAL HIGH (ref 8–23)
Calcium, Ion: 1.1 mmol/L — ABNORMAL LOW (ref 1.15–1.40)
Chloride: 109 mmol/L (ref 98–111)
Creatinine, Ser: 2.8 mg/dL — ABNORMAL HIGH (ref 0.61–1.24)
Glucose, Bld: 191 mg/dL — ABNORMAL HIGH (ref 70–99)
HCT: 31 % — ABNORMAL LOW (ref 39.0–52.0)
Hemoglobin: 10.5 g/dL — ABNORMAL LOW (ref 13.0–17.0)
Potassium: 4.5 mmol/L (ref 3.5–5.1)
Sodium: 140 mmol/L (ref 135–145)
TCO2: 23 mmol/L (ref 22–32)

## 2023-10-02 LAB — CBC WITH DIFFERENTIAL/PLATELET
Abs Immature Granulocytes: 0.08 10*3/uL — ABNORMAL HIGH (ref 0.00–0.07)
Basophils Absolute: 0.1 10*3/uL (ref 0.0–0.1)
Basophils Relative: 1 %
Eosinophils Absolute: 0.2 10*3/uL (ref 0.0–0.5)
Eosinophils Relative: 2 %
HCT: 33.3 % — ABNORMAL LOW (ref 39.0–52.0)
Hemoglobin: 10.6 g/dL — ABNORMAL LOW (ref 13.0–17.0)
Immature Granulocytes: 1 %
Lymphocytes Relative: 12 %
Lymphs Abs: 1.4 10*3/uL (ref 0.7–4.0)
MCH: 28.9 pg (ref 26.0–34.0)
MCHC: 31.8 g/dL (ref 30.0–36.0)
MCV: 90.7 fL (ref 80.0–100.0)
Monocytes Absolute: 0.8 10*3/uL (ref 0.1–1.0)
Monocytes Relative: 7 %
Neutro Abs: 9.4 10*3/uL — ABNORMAL HIGH (ref 1.7–7.7)
Neutrophils Relative %: 77 %
Platelets: 306 10*3/uL (ref 150–400)
RBC: 3.67 MIL/uL — ABNORMAL LOW (ref 4.22–5.81)
RDW: 13.6 % (ref 11.5–15.5)
WBC: 12 10*3/uL — ABNORMAL HIGH (ref 4.0–10.5)
nRBC: 0 % (ref 0.0–0.2)

## 2023-10-02 LAB — COMPREHENSIVE METABOLIC PANEL WITH GFR
ALT: 19 U/L (ref 0–44)
AST: 23 U/L (ref 15–41)
Albumin: 3.7 g/dL (ref 3.5–5.0)
Alkaline Phosphatase: 71 U/L (ref 38–126)
Anion gap: 14 (ref 5–15)
BUN: 43 mg/dL — ABNORMAL HIGH (ref 8–23)
CO2: 21 mmol/L — ABNORMAL LOW (ref 22–32)
Calcium: 9.3 mg/dL (ref 8.9–10.3)
Chloride: 105 mmol/L (ref 98–111)
Creatinine, Ser: 2.6 mg/dL — ABNORMAL HIGH (ref 0.61–1.24)
GFR, Estimated: 24 mL/min — ABNORMAL LOW (ref >60)
Glucose, Bld: 193 mg/dL — ABNORMAL HIGH (ref 70–99)
Potassium: 4.5 mmol/L (ref 3.5–5.1)
Sodium: 140 mmol/L (ref 135–145)
Total Bilirubin: 0.5 mg/dL (ref 0.0–1.2)
Total Protein: 7.3 g/dL (ref 6.5–8.1)

## 2023-10-02 LAB — HEMOGLOBIN A1C
Hgb A1c MFr Bld: 8 % — ABNORMAL HIGH (ref 4.8–5.6)
Mean Plasma Glucose: 182.9 mg/dL

## 2023-10-02 LAB — TYPE AND SCREEN
ABO/RH(D): O POS
Antibody Screen: NEGATIVE

## 2023-10-02 MED ORDER — ONDANSETRON HCL 4 MG/2ML IJ SOLN
4.0000 mg | Freq: Once | INTRAMUSCULAR | Status: AC
  Administered 2023-10-02: 4 mg via INTRAVENOUS
  Filled 2023-10-02: qty 2

## 2023-10-02 MED ORDER — FERROUS SULFATE 325 (65 FE) MG PO TABS
325.0000 mg | ORAL_TABLET | Freq: Every day | ORAL | Status: DC
  Administered 2023-10-03 – 2023-10-18 (×14): 325 mg via ORAL
  Filled 2023-10-02 (×14): qty 1

## 2023-10-02 MED ORDER — PANTOPRAZOLE SODIUM 40 MG PO TBEC
40.0000 mg | DELAYED_RELEASE_TABLET | Freq: Two times a day (BID) | ORAL | Status: DC
  Administered 2023-10-03 – 2023-10-18 (×29): 40 mg via ORAL
  Filled 2023-10-02 (×29): qty 1

## 2023-10-02 MED ORDER — ROSUVASTATIN CALCIUM 20 MG PO TABS
20.0000 mg | ORAL_TABLET | Freq: Every evening | ORAL | Status: DC
  Administered 2023-10-03 – 2023-10-17 (×15): 20 mg via ORAL
  Filled 2023-10-02 (×16): qty 1

## 2023-10-02 MED ORDER — ACETAMINOPHEN 325 MG PO TABS
650.0000 mg | ORAL_TABLET | Freq: Four times a day (QID) | ORAL | Status: DC | PRN
  Administered 2023-10-03 – 2023-10-05 (×3): 650 mg via ORAL
  Filled 2023-10-02 (×3): qty 2

## 2023-10-02 MED ORDER — DULOXETINE HCL 20 MG PO CPEP
20.0000 mg | ORAL_CAPSULE | Freq: Every day | ORAL | Status: DC
  Administered 2023-10-03 – 2023-10-18 (×15): 20 mg via ORAL
  Filled 2023-10-02 (×15): qty 1

## 2023-10-02 MED ORDER — HYDROCODONE-ACETAMINOPHEN 5-325 MG PO TABS: 1.0000 | ORAL_TABLET | Freq: Four times a day (QID) | ORAL | 0 refills | Status: DC | PRN

## 2023-10-02 MED ORDER — LACTATED RINGERS IV SOLN: INTRAVENOUS | Status: DC

## 2023-10-02 MED ORDER — MELATONIN 5 MG PO TABS
5.0000 mg | ORAL_TABLET | Freq: Every evening | ORAL | Status: DC | PRN
  Administered 2023-10-06 – 2023-10-13 (×5): 5 mg via ORAL
  Filled 2023-10-02 (×5): qty 1

## 2023-10-02 MED ORDER — POLYETHYLENE GLYCOL 3350 17 G PO PACK: 17.0000 g | PACK | Freq: Every day | ORAL | Status: DC | PRN

## 2023-10-02 MED ORDER — PROCHLORPERAZINE EDISYLATE 10 MG/2ML IJ SOLN: 5.0000 mg | Freq: Four times a day (QID) | INTRAMUSCULAR | Status: DC | PRN

## 2023-10-02 MED ORDER — INSULIN ASPART 100 UNIT/ML IJ SOLN
0.0000 [IU] | Freq: Three times a day (TID) | INTRAMUSCULAR | Status: DC
  Administered 2023-10-03: 1 [IU] via SUBCUTANEOUS
  Administered 2023-10-03: 2 [IU] via SUBCUTANEOUS
  Administered 2023-10-04: 1 [IU] via SUBCUTANEOUS
  Administered 2023-10-05 – 2023-10-06 (×5): 2 [IU] via SUBCUTANEOUS
  Administered 2023-10-07 (×2): 1 [IU] via SUBCUTANEOUS
  Administered 2023-10-08: 2 [IU] via SUBCUTANEOUS
  Administered 2023-10-08: 1 [IU] via SUBCUTANEOUS
  Administered 2023-10-09: 3 [IU] via SUBCUTANEOUS
  Administered 2023-10-09 – 2023-10-10 (×4): 1 [IU] via SUBCUTANEOUS
  Administered 2023-10-10 – 2023-10-11 (×2): 2 [IU] via SUBCUTANEOUS
  Administered 2023-10-11: 1 [IU] via SUBCUTANEOUS
  Administered 2023-10-11 – 2023-10-12 (×2): 2 [IU] via SUBCUTANEOUS
  Administered 2023-10-13 (×2): 1 [IU] via SUBCUTANEOUS
  Administered 2023-10-13: 3 [IU] via SUBCUTANEOUS
  Administered 2023-10-14: 2 [IU] via SUBCUTANEOUS
  Administered 2023-10-14: 1 [IU] via SUBCUTANEOUS
  Administered 2023-10-15: 5 [IU] via SUBCUTANEOUS
  Administered 2023-10-15 – 2023-10-16 (×3): 2 [IU] via SUBCUTANEOUS
  Administered 2023-10-16 – 2023-10-17 (×2): 3 [IU] via SUBCUTANEOUS
  Administered 2023-10-17: 2 [IU] via SUBCUTANEOUS
  Administered 2023-10-17: 3 [IU] via SUBCUTANEOUS
  Administered 2023-10-18 (×2): 2 [IU] via SUBCUTANEOUS

## 2023-10-02 MED ORDER — INSULIN ASPART 100 UNIT/ML IJ SOLN
0.0000 [IU] | Freq: Every day | INTRAMUSCULAR | Status: DC
  Administered 2023-10-12: 3 [IU] via SUBCUTANEOUS
  Administered 2023-10-15: 2 [IU] via SUBCUTANEOUS
  Administered 2023-10-16: 3 [IU] via SUBCUTANEOUS
  Administered 2023-10-17: 4 [IU] via SUBCUTANEOUS

## 2023-10-02 MED ORDER — MORPHINE SULFATE (PF) 4 MG/ML IV SOLN
4.0000 mg | Freq: Once | INTRAVENOUS | Status: AC
  Administered 2023-10-02: 4 mg via INTRAVENOUS
  Filled 2023-10-02: qty 1

## 2023-10-02 MED ORDER — ACETAMINOPHEN 500 MG PO TABS
1000.0000 mg | ORAL_TABLET | Freq: Once | ORAL | Status: AC
  Administered 2023-10-02: 1000 mg via ORAL
  Filled 2023-10-02: qty 2

## 2023-10-02 MED ORDER — CLOPIDOGREL BISULFATE 75 MG PO TABS
75.0000 mg | ORAL_TABLET | Freq: Every evening | ORAL | Status: DC
  Administered 2023-10-03 – 2023-10-04 (×3): 75 mg via ORAL
  Filled 2023-10-02 (×3): qty 1

## 2023-10-02 MED ORDER — METHYLPREDNISOLONE 4 MG PO TBPK: ORAL_TABLET | ORAL | 0 refills | Status: DC

## 2023-10-02 NOTE — Discharge Instructions (Addendum)
 Return for any problem.   Wear the cervical soft collar for comfort.  Take Medrol Dosepak as instructed for anti-inflammatory effect.  Use Tylenol for pain.  If required you may use the stronger pain medication as prescribed.

## 2023-10-02 NOTE — Progress Notes (Signed)
 Chaplain responds to Level 2 fall and provides pastoral support particularly to patient's wife, who shares that she's pt's full-time caregiver and she turned her back for a moment, and that's when his legs gave out. Chaplain offers compassionate presence, reflective listening, and encouragement. They deny further needs.

## 2023-10-02 NOTE — ED Notes (Signed)
 Trauma Response Nurse Documentation   Christian Bailey is a 83 y.o. male arriving to [facility] ED via EMS  On clopidogrel 75 mg daily. Trauma was activated as a Level 2 by ED Charge RN based on the following trauma criteria Elderly patients > 65 with head trauma on anti-coagulation (excluding ASA).  Patient cleared for CT by Dr. Lynelle Doctor. Pt transported to CT with trauma response nurse present to monitor. RN remained with the patient throughout their absence from the department for clinical observation.   GCS 15.  History   Past Medical History:  Diagnosis Date   Anemia    Complication of anesthesia    Diabetes mellitus (HCC)    TYPE 2   GERD (gastroesophageal reflux disease)    History of blood transfusion    GI bleed   History of colon polyps    History of hiatal hernia    Hyperlipemia    Hypertension    Osteoarthritis    PAD (peripheral artery disease) (HCC)    a. stenting of his left common iliac artery >20 years ago. b. h/o LEIA stent and 2 stents to R SFA in 2011. c. 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 Noted during admission 04/2014 but upon review seems to be frequent finding for patient.   Stenosis of carotid artery    a. 50% right carotid stenosis by angiogram 04/2014.     Past Surgical History:  Procedure Laterality Date   ABDOMINAL AORTOGRAM W/LOWER EXTREMITY N/A 01/27/2017   Procedure: Abdominal Aortogram w/Lower Extremity;  Surgeon: Nada Libman, MD;  Location: [facility];  Service: Cardiovascular;  Laterality: N/A;   ABDOMINAL AORTOGRAM W/LOWER EXTREMITY N/A 06/08/2017   Procedure: ABDOMINAL AORTOGRAM W/LOWER  EXTREMITY;  Surgeon: Nada Libman, MD;  Location: [facility];  Service: Cardiovascular;  Laterality: N/A;   ABDOMINAL AORTOGRAM W/LOWER EXTREMITY N/A 08/31/2017   Procedure: ABDOMINAL AORTOGRAM W/LOWER EXTREMITY;  Surgeon: Nada Libman, MD;  Location: [facility];  Service: Cardiovascular;  Laterality: N/A;  rt. unilateral   ANGIOPLASTY / STENTING FEMORAL     ANGIOPLASTY / STENTING ILIAC     AORTA - BILATERAL FEMORAL ARTERY BYPASS GRAFT N/A 07/06/2018   Procedure: AORTA BIFEMORAL BYPASS GRAFT USING 14X7MM X 40CM HEMASHIELD GOLD GRAFT;  Surgeon: Nada Libman, MD;  Location: [facility] OR;  Service: Vascular;  Laterality: N/A;   CEREBRAL ANGIOGRAM N/A 05/14/2014   Procedure: CEREBRAL ANGIOGRAM;  Surgeon: Runell Gess, MD;  Location: [facility] CATH LAB;  Service: Cardiovascular;  Laterality: N/A;   COLONOSCOPY W/ POLYPECTOMY     ENDARTERECTOMY Right 09/08/2017   ENDARTERECTOMY FEMORAL Right 09/08/2017   Procedure: REDO RIGHT FEMORAL ENDARTECTOMY WITH PATCH ANGIOPLASTY.;  Surgeon: Nada Libman, MD;  Location: [facility] OR;  Service: Vascular;  Laterality: Right;   EYE SURGERY Bilateral    cataract   FEMORAL ARTERY STENT Right 05/12/2010   Stented distally with a 6x100 Abbott absolute stent and proximally with a 6x60 Cook Zilver stent resulting in the reduction of the proximal segment 80% and mid segment 60-70% to 0% residual, LEFT common femoral artery stented with  a 7x3 Smart stent resulting in reduction of 90% stenosis to 0% residual   FEMORAL-POPLITEAL BYPASS GRAFT Right 09/18/2016   Procedure: BYPASS GRAFT FEMORAL-POPLITEAL ARTERY;  Surgeon: Nada Libman, MD;  Location: [facility] OR;  Service: Vascular;  Laterality: Right;   FEMORAL-POPLITEAL BYPASS GRAFT Right 09/08/2017   Procedure: REDO BYPASS GRAFT FEMORAL-POPLITEAL ARTERY;  Surgeon: Nada Libman, MD;  Location: [facility] OR;  Service: Vascular;  Laterality: Right;   FEMORAL-POPLITEAL BYPASS GRAFT Right 07/06/2018   Procedure: REVISION RIGHT  FEMORAL TO POPLITEAL ARTERY BYPASS GRAFT;  Surgeon: Nada Libman, MD;  Location: [facility] OR;  Service: Vascular;  Laterality: Right;   IR CHOLANGIOGRAM EXISTING TUBE  08/04/2018   IR EXCHANGE BILIARY DRAIN  01/22/2019   IR PERC CHOLECYSTOSTOMY  07/19/2018   IR RADIOLOGIST EVAL & MGMT  08/31/2018   IR RADIOLOGIST EVAL & MGMT  01/17/2019   IR RADIOLOGIST EVAL & MGMT  02/01/2019   IR RADIOLOGIST EVAL & MGMT  02/08/2019   KNEE ARTHROSCOPY     left   LOWER EXTREMITY ANGIOGRAM N/A 05/14/2014   Procedure: LOWER EXTREMITY ANGIOGRAM;  Surgeon: Runell Gess, MD;  Location: [facility] CATH LAB;  Service: Cardiovascular;  Laterality: N/A;   LOWER EXTREMITY ANGIOGRAPHY N/A 09/21/2016   Procedure: Lower Extremity Angiography;  Surgeon: Fransisco Hertz, MD;  Location: [facility] INVASIVE CV LAB;  Service: Cardiovascular;  Laterality: N/A;   PERIPHERAL VASCULAR ATHERECTOMY Right 01/27/2017   Procedure: PERIPHERAL VASCULAR ATHERECTOMY;  Surgeon: Nada Libman, MD;  Location: [facility];  Service: Cardiovascular;  Laterality: Right;   PERIPHERAL VASCULAR BALLOON ANGIOPLASTY Right 06/08/2017   Procedure: PERIPHERAL VASCULAR BALLOON ANGIOPLASTY;  Surgeon: Nada Libman, MD;  Location: [facility];  Service: Cardiovascular;  Laterality: Right;  common femoral and superficial femoral arteries   PERIPHERAL VASCULAR CATHETERIZATION N/A 04/20/2016   Procedure: Lower Extremity Intervention;  Surgeon: Runell Gess, MD;  Location: [facility] INVASIVE CV LAB;  Service: Cardiovascular;  Laterality: N/A;   PERIPHERAL VASCULAR INTERVENTION  08/31/2017   Procedure: PERIPHERAL VASCULAR INTERVENTION;  Surgeon: Nada Libman, MD;  Location: [facility];  Service: Cardiovascular;;  REIA   REVERSE SHOULDER ARTHROPLASTY Right 12/07/2016   REVERSE SHOULDER ARTHROPLASTY Right 12/07/2016   Procedure: REVERSE SHOULDER ARTHROPLASTY;  Surgeon: Beverely Low, MD;  Location: [facility] OR;  Service: Orthopedics;  Laterality: Right;   ROTATOR CUFF  REPAIR Right 2003   SFA Right 05/14/2014   PTA  OF RT SFA         DR BERRY      Initial Focused Assessment (If applicable, or please see trauma documentation): Airway: intact, patent Breathing: Breath sounds clear, equal bilaterally.  No SOB. Circulation: Abrasions and skin tears to bilateral arms/elbows.  R FA hematoma noted. Small puncture wound to chin. Pulses intact centrally and peripherally.  Disability: PERRLA, A/Ox4. C-collar in place.  VS: WDL  CT's Completed:   CT Head and CT C-Spine  CT thoracic spine.   Interventions:  -20G PIV to R AC -Trauma labs drawn -Morphine given -Zofran given -Replaced field collar with [place] J -MRI of C-spine ordered due to possible ligamentous injury.   Plan for disposition:  Other Awaiting MRI of c-spine  Consults completed:  none at 1650.  Event Summary: Pt BIB [facility] as a L2 FOT.  Pt fell from standing while his wife was trying to get him dressed/change his diaper.  Pt hit his head on the wall but denies LOC.  Skin tears noted to  BLE. Pt is on plavix.   Bedside handoff with ED RN Chloe.    Janora Norlander  Trauma Response RN  Please call TRN at [phone] for further assistance.

## 2023-10-02 NOTE — ED Triage Notes (Signed)
 Pt BIB [facility] from home as a level 2 trauma. Pt fell from standing. Hit his head on the wall. Skin tears to both forearms. Hematoma on right forearm. C/o neck and back pain. A&O X4. Pt is on plavix.  97 HR  96%  158/86 CBG 185

## 2023-10-02 NOTE — ED Provider Notes (Signed)
 [facility] AT [facility] Provider Note   CSN: [id number] Arrival date & time: 10/02/23  1446     History {Add pertinent medical, surgical, social history, OB history to HPI:1} Chief Complaint  Patient presents with   LEVEL 2 TRAUMA     Christian Bailey is a 83 y.o. male.  83 year old male with prior medical history as detailed below presents for evaluation after fall from standing.  Patient is on Plavix.  He was at home with his wife.  He was having his wife help him get his pants and adult diaper on.  He lost his balance in the process of getting his pants on and fell down.  He has an abrasion to his right forearm.  He complains of upper back and low neck pain.  He is alert and oriented on arrival.  He is conversant.  He denies chest pain or shortness of breath.  He denies hip pain.  He denies abdominal pain.  Tetanus up-to-date.  He uses a walker at base line.   PMHx includes hypertension, hyperlipidemia, diabetes, CKD 3B, carotid artery disease, PAD, macular degeneration (Legally Blind), patient has ongoing chronic bilateral lower extremity pain from his PAD.  The history is provided by the patient.       Home Medications Prior to Admission medications   Medication Sig Start Date End Date Taking? Authorizing Provider  clopidogrel (PLAVIX) 75 MG tablet TAKE 1 TABLET DAILY 06/28/23   Runell Gess, MD  ferrous sulfate 325 (65 FE) MG tablet Take 325 mg by mouth daily.    [provider]  JARDIANCE 10 MG TABS tablet Oral for 90 Days    [provider]  lisinopril (ZESTRIL) 5 MG tablet Take 5 mg by mouth 2 (two) times daily.    [provider]  metoprolol tartrate (LOPRESSOR) 50 MG tablet Take 1 tablet (50 mg total) by mouth 2 (two) times daily. 05/11/23   Leroy Sea, MD  MYRBETRIQ 50 MG TB24 tablet Take 50 mg by mouth daily. 10/12/22   [provider]  pantoprazole (PROTONIX) 40 MG tablet Take 1 tablet  (40 mg total) by mouth 2 (two) times daily. 09/10/22   Runell Gess, MD  polyethylene glycol (MIRALAX / GLYCOLAX) 17 g packet Take 17 g by mouth daily as needed for mild constipation. 05/11/23   Leroy Sea, MD  rosuvastatin (CRESTOR) 20 MG tablet Take 20 mg by mouth every evening.     [provider]  traMADol (ULTRAM) 50 MG tablet Take 1 tablet (50 mg total) by mouth every 12 (twelve) hours as needed. 05/11/23 05/10/24  Leroy Sea, MD  UNABLE TO FIND Take 1 tablet by mouth daily as needed (solidify bowel movements). Med Name: [place]    [provider]      Allergies    Asa [aspirin], Gadolinium derivatives, Iodinated contrast media, Pneumococcal 13-val conj vacc, Scopolamine, and Oxycodone-acetaminophen    Review of Systems   Review of Systems  All other systems reviewed and are negative.   Physical Exam Updated Vital Signs BP (!) 149/117   Pulse 92   Temp 97.6 F (36.4 C) (Oral)   Resp 19   SpO2 100%  Physical Exam Vitals and nursing note reviewed.  Constitutional:      General: He is not in acute distress.    Appearance: Normal appearance. He is well-developed.  HENT:     Head: Normocephalic.  Eyes:     Conjunctiva/sclera: Conjunctivae  normal.     Pupils: Pupils are equal, round, and reactive to light.  Neck:     Comments: Cervical collar in place.  Diffuse tenderness to palpation along the posterior aspect of the inferior cervical spine and upper thoracic spine. Cardiovascular:     Rate and Rhythm: Normal rate and regular rhythm.     Heart sounds: Normal heart sounds.  Pulmonary:     Effort: Pulmonary effort is normal. No respiratory distress.     Breath sounds: Normal breath sounds.  Abdominal:     General: There is no distension.     Palpations: Abdomen is soft.     Tenderness: There is no abdominal tenderness.  Musculoskeletal:        General: No deformity. Normal range of motion.     Cervical back: Normal range of motion.   Skin:    General: Skin is warm and dry.     Comments: Abrasion and skin tear to the right forearm.  Distal right upper extremity and left upper extremity neurovascular intact.  Neurological:     General: No focal deficit present.     Mental Status: He is alert and oriented to person, place, and time. Mental status is at baseline.     Cranial Nerves: No cranial nerve deficit.     Motor: No weakness.     ED Results / Procedures / Treatments   Labs (all labs ordered are listed, but only abnormal results are displayed) Labs Reviewed  COMPREHENSIVE METABOLIC PANEL WITH GFR  CBC WITH DIFFERENTIAL/PLATELET  I-STAT CHEM 8, ED  TYPE AND SCREEN    EKG None  Radiology No results found.  Procedures Procedures  {Document cardiac monitor, telemetry assessment procedure when appropriate:1}  Medications Ordered in ED Medications - No data to display  ED Course/ Medical Decision Making/ A&P   {   Click here for ABCD2, HEART and other calculatorsREFRESH Note before signing :1}                              Medical Decision Making Amount and/or Complexity of Data Reviewed Labs: ordered. Radiology: ordered.  Risk OTC drugs. Prescription drug management. Decision regarding hospitalization.    Medical Screen Complete  This patient presented to the ED with complaint of fall.  This complaint involves an extensive number of treatment options. The initial differential diagnosis includes, but is not limited to, trauma from fall  This presentation is: Acute, Chronic, Self-Limited, Previously Undiagnosed, Uncertain Prognosis, Complicated, Systemic Symptoms, and Threat to Life/Bodily Function      Co morbidities that complicated the patient's evaluation  ***   Additional history obtained:  Additional history obtained from {History source:19196::"EMS","Spouse","Family","Friend","Caregiver"} External records from outside sources obtained and reviewed including prior ED visits  and prior Inpatient records.    Lab Tests:  I ordered and personally interpreted labs.  The pertinent results include:  ***   Imaging Studies ordered:  I ordered imaging studies including ***  I independently visualized and interpreted obtained imaging which showed *** I agree with the radiologist interpretation.   Cardiac Monitoring:  The patient was maintained on a cardiac monitor.  I personally viewed and interpreted the cardiac monitor which showed an underlying rhythm of: ***   Medicines ordered:  I ordered medication including ***  for ***  Reevaluation of the patient after these medicines showed that the patient: {resolved/improved/worsened:23923::"improved"}    Test Considered:  ***   Critical Interventions:  ***  Consultations Obtained:  I consulted ***,  and discussed lab and imaging findings as well as pertinent plan of care.    Problem List / ED Course:  ***   Reevaluation:  After the interventions noted above, I reevaluated the patient and found that they have: {resolved/improved/worsened:23923::"improved"}   Social Determinants of Health:  ***   Disposition:  After consideration of the diagnostic results and the patients response to treatment, I feel that the patent would benefit from ***.    {Document critical care time when appropriate:1} {Document review of labs and clinical decision tools ie heart score, Chads2Vasc2 etc:1}  {Document your independent review of radiology images, and any outside records:1} {Document your discussion with family members, caretakers, and with consultants:1} {Document social determinants of health affecting pt's care:1} {Document your decision making why or why not admission, treatments were needed:1} Final Clinical Impression(s) / ED Diagnoses Final diagnoses:  None    Rx / DC Orders ED Discharge Orders     None

## 2023-10-02 NOTE — Progress Notes (Signed)
 Orthopedic Tech Progress Note Patient Details:  Staley Lunz 1941-05-15 [id number] Level 2 Trauma  Patient ID: [name], male   DOB: 06/11/1941, 83 y.o.   MRN: [id number]  Genelle Bal Jullianna Gabor 10/02/2023, 3:00 PM

## 2023-10-02 NOTE — Progress Notes (Signed)
 Orthopedic Tech Progress Note Patient Details:  Christian Bailey 1941/01/12 [id number]  Ortho Devices Type of Ortho Device: Soft collar Ortho Device/Splint Interventions: Application   Post Interventions Patient Tolerated: [name] 10/02/2023, 6:50 PM

## 2023-10-02 NOTE — H&P (Addendum)
 History and Physical  Christian Bailey RUE:[id number] DOB: 1940/09/04 DOA: 10/02/2023  Referring physician: Dr. Rodena Medin, EDP  PCP: Chilton Greathouse, MD  Outpatient Specialists: Cardiology. Patient coming from: Home.   Chief Complaint: Fall.  HPI: Christian Bailey is a 83 y.o. male with medical history significant for peripheral artery disease on Plavix and Crestor, RBBB, chronic HFpEF, CKD 4, ambulatory dysfunction (uses a walker at baseline), carotid stenosis, hypertension, hyperlipidemia, type 2 diabetes, chronic anxiety/depression, iron deficiency anemia, GERD, who presents to the ER after a fall.  The patient states he has had trouble with his balance for many years which he attributes to his peripheral vascular disease.  He has received PT/OT outpatient in the past which he states it did not help.  Has not kept up with his exercises.  Per his wife at bedside he gets discouraged due to the pain in his legs from his peripheral artery disease.    Today his legs gave out causing him to fall from a standing position.  He was at home with his wife and was having his wife help him get his pants on and adult diaper on.  He lost his balance in the process and fell down.  He hit his head on the wall.  Complaining of neck and back pain.  EMS was activated.  In the ER, noncontrast head CT showed no acute intracranial abnormality.  No skull fracture.  CT cervical spine showed no acute fracture or subluxation of the cervical spine.  Diffuse ossification of the posterior longitudinal ligament extending from C2-C7.  Additional posterior disc osteophyte complex.  Varying degrees of spinal canal stenosis.    MRI cervical spine without contrast showed edematous changes along the posterior spinous ligament from C2-C5 compatible with acute ligamentous injury.  Diffuse edematous changes within the posterior paraspinous musculature bilaterally throughout the cervical spine compatible with muscle strain.  Compression of the  spinal cord to 5 mm at C5-6 and C6-7 secondary to disc disease and ossification of posterior longitudinal ligament.  Moderate foraminal stenosis bilaterally at C6-7 and on the right at C7-T1.  Mild foraminal narrowing bilaterally at C2-3 and C3-4.  EDP discussed the case with neurosurgery- on-call, recommended soft cervical collar.  The patient failed his walk test in the ER and was significantly off balance.  Due to unsafe discharge, EDP requested admission for observation and PT OT assessment.  Admitted by [facility], hospitalist service.  ED Course: Temperature 97.5.  BP 148/73, pulse 101, respiration rate 20, O2 saturation of 100% on room air.  Lab studies notable for elevated creatinine above baseline, BUN 42, creatinine 2.80 with GFR 24.  WBC 12.0.  Hemoglobin 10.5.  Platelet count 306.  Neutrophil count 9.4.  Chest x-ray showed no active disease.    Review of Systems: Review of systems as noted in the HPI. All other systems reviewed and are negative.   Past Medical History:  Diagnosis Date   Anemia    Complication of anesthesia    Diabetes mellitus (HCC)    TYPE 2   GERD (gastroesophageal reflux disease)    History of blood transfusion    GI bleed   History of colon polyps    History of hiatal hernia    Hyperlipemia    Hypertension    Osteoarthritis    PAD (peripheral artery disease) (HCC)    a. stenting of his left common iliac artery >20 years ago. b. h/o LEIA stent and 2 stents to R SFA in 2011. c. 04/2014:  s/p  PTA of right SFA for in-stent restenosis, occluded left SFA   PONV (postoperative nausea and vomiting)    no porblem with the last 3 surgeries   RBBB (right bundle branch block with left anterior fascicular block)    NUCLEAR STRESS TEST, 08/18/2010 - no significant wall motion abnoramlities noted, post-stress EF 69%, normal myocardial perfusion study   Sinus tachycardia    a. Noted during admission 04/2014 but upon review seems to be frequent finding for patient.    Stenosis of carotid artery    a. 50% right carotid stenosis by angiogram 04/2014.   Past Surgical History:  Procedure Laterality Date   ABDOMINAL AORTOGRAM W/LOWER EXTREMITY N/A 01/27/2017   Procedure: Abdominal Aortogram w/Lower Extremity;  Surgeon: Nada Libman, MD;  Location: [facility] INVASIVE CV [facility];  Service: Cardiovascular;  Laterality: N/A;   ABDOMINAL AORTOGRAM W/LOWER EXTREMITY N/A 06/08/2017   Procedure: ABDOMINAL AORTOGRAM W/LOWER EXTREMITY;  Surgeon: Nada Libman, MD;  Location: [facility] CV [facility];  Service: Cardiovascular;  Laterality: N/A;   ABDOMINAL AORTOGRAM W/LOWER EXTREMITY N/A 08/31/2017   Procedure: ABDOMINAL AORTOGRAM W/LOWER EXTREMITY;  Surgeon: Nada Libman, MD;  Location: [facility];  Service: Cardiovascular;  Laterality: N/A;  rt. unilateral   ANGIOPLASTY / STENTING FEMORAL     ANGIOPLASTY / STENTING ILIAC     AORTA - BILATERAL FEMORAL ARTERY BYPASS GRAFT N/A 07/06/2018   Procedure: AORTA BIFEMORAL BYPASS GRAFT USING 14X7MM X 40CM HEMASHIELD GOLD GRAFT;  Surgeon: Nada Libman, MD;  Location: [facility] OR;  Service: Vascular;  Laterality: N/A;   CEREBRAL ANGIOGRAM N/A 05/14/2014   Procedure: CEREBRAL ANGIOGRAM;  Surgeon: Runell Gess, MD;  Location[facility] CATH LAB;  Service: Cardiovascular;  Laterality: N/A;   COLONOSCOPY W/ POLYPECTOMY     ENDARTERECTOMY Right 09/08/2017   ENDARTERECTOMY FEMORAL Right 09/08/2017   Procedure: REDO RIGHT FEMORAL ENDARTECTOMY WITH PATCH ANGIOPLASTY.;  Surgeon: Nada Libman, MD;  Location: [facility] OR;  Service: Vascular;  Laterality: Right;   EYE SURGERY Bilateral    cataract   FEMORAL ARTERY STENT Right 05/12/2010   Stented distally with a 6x100 Abbott absolute stent and proximally with a 6x60 Cook Zilver stent resulting in the reduction of the proximal segment 80% and mid segment 60-70% to 0% residual, LEFT common femoral artery stented with a 7x3 Smart stent resulting in reduction of 90% stenosis to 0% residual    FEMORAL-POPLITEAL BYPASS GRAFT Right 09/18/2016   Procedure: BYPASS GRAFT FEMORAL-POPLITEAL ARTERY;  Surgeon: Nada Libman, MD;  Location: [facility] OR;  Service: Vascular;  Laterality: Right;   FEMORAL-POPLITEAL BYPASS GRAFT Right 09/08/2017   Procedure: REDO BYPASS GRAFT FEMORAL-POPLITEAL ARTERY;  Surgeon: Nada Libman, MD;  Location: [facility] OR;  Service: Vascular;  Laterality: Right;   FEMORAL-POPLITEAL BYPASS GRAFT Right 07/06/2018   Procedure: REVISION RIGHT FEMORAL TO POPLITEAL ARTERY BYPASS GRAFT;  Surgeon: Nada Libman, MD;  Location: [facility] OR;  Service: Vascular;  Laterality: Right;   IR CHOLANGIOGRAM EXISTING TUBE  08/04/2018   IR EXCHANGE BILIARY DRAIN  01/22/2019   IR PERC CHOLECYSTOSTOMY  07/19/2018   IR RADIOLOGIST EVAL & MGMT  08/31/2018   IR RADIOLOGIST EVAL & MGMT  01/17/2019   IR RADIOLOGIST EVAL & MGMT  02/01/2019   IR RADIOLOGIST EVAL & MGMT  02/08/2019   KNEE ARTHROSCOPY     left   LOWER EXTREMITY ANGIOGRAM N/A 05/14/2014   Procedure: LOWER EXTREMITY ANGIOGRAM;  Surgeon: Runell Gess, MD;  Location: [facility] CATH LAB;  Service: Cardiovascular;  Laterality: N/A;   LOWER EXTREMITY ANGIOGRAPHY N/A 09/21/2016   Procedure: Lower Extremity Angiography;  Surgeon: Fransisco Hertz, MD;  Location: [facility] INVASIVE CV LAB;  Service: Cardiovascular;  Laterality: N/A;   PERIPHERAL VASCULAR ATHERECTOMY Right 01/27/2017   Procedure: PERIPHERAL VASCULAR ATHERECTOMY;  Surgeon: Nada Libman, MD;  Location: [facility];  Service: Cardiovascular;  Laterality: Right;   PERIPHERAL VASCULAR BALLOON ANGIOPLASTY Right 06/08/2017   Procedure: PERIPHERAL VASCULAR BALLOON ANGIOPLASTY;  Surgeon: Nada Libman, MD;  Location: [facility];  Service: Cardiovascular;  Laterality: Right;  common femoral and superficial femoral arteries   PERIPHERAL VASCULAR CATHETERIZATION N/A 04/20/2016   Procedure: Lower Extremity Intervention;  Surgeon: Runell Gess, MD;  [facility] INVASIVE CV LAB;  Service:  Cardiovascular;  Laterality: N/A;   PERIPHERAL VASCULAR INTERVENTION  08/31/2017   Procedure: PERIPHERAL VASCULAR INTERVENTION;  Surgeon: Nada Libman, MD;  Location: [facility];  Service: Cardiovascular;;  REIA   REVERSE SHOULDER ARTHROPLASTY Right 12/07/2016   REVERSE SHOULDER ARTHROPLASTY Right 12/07/2016   Procedure: REVERSE SHOULDER ARTHROPLASTY;  Surgeon: Beverely Low, MD;  Location: [facility] OR;  Service: Orthopedics;  Laterality: Right;   ROTATOR CUFF REPAIR Right 2003   SFA Right 05/14/2014   PTA  OF RT SFA         DR BERRY    Social History:  reports that he quit smoking about 35 years ago. He started smoking about 60 years ago. He has never used smokeless tobacco. He reports current alcohol use. He reports that he does not use drugs.   Allergies  Allergen Reactions   Asa [Aspirin] Other (See Comments)    GI bleeding   Gadolinium Derivatives Itching, Swelling and Other (See Comments)    Pt complained of face flushing/hottness and throat tightness/scratchiness immediately after the injections.  Within 4 minutes, all symptoms were gone and the study was completed.  No further complications or signs of allergy were exhibited after completion of study.    Iodinated Contrast Media     Other Reaction(s): Not available   Pneumococcal 13-Val Conj Vacc     Other Reaction(s): achiness all over, dizziness, nausea, weakness   Scopolamine     Other Reaction(s): Delerium   Oxycodone-Acetaminophen Other (See Comments)    Dizziness and feeling of being uncomfortable     Family History  Problem Relation Age of Onset   Heart disease Mother    Leukemia Mother    Stomach cancer Father    Esophageal cancer Brother    Liver disease Brother    Alcoholism Brother    Leukemia Brother    Leukemia Brother    Diabetes type II Brother    Lung disease Sister       Prior to Admission medications   Medication Sig Start Date End Date Taking? Authorizing Provider  clopidogrel (PLAVIX) 75  MG tablet TAKE 1 TABLET DAILY 06/28/23  Yes Runell Gess, MD  DULoxetine (CYMBALTA) 20 MG capsule Take 20 mg by mouth daily. 08/26/23  Yes [provider]  HYDROcodone-acetaminophen (NORCO/VICODIN) 5-325 MG tablet Take 1 tablet by mouth every 6 (six) hours as needed. 10/02/23  Yes Wynetta Fines, MD  methylPREDNISolone (MEDROL DOSEPAK) 4 MG TBPK tablet Take Medrol Dosepak as per manufacturer's direction. 10/02/23  Yes Wynetta Fines, MD  metoprolol succinate (TOPROL-XL) 25 MG 24 hr tablet Take 25 mg by mouth 2 (two) times daily. 09/29/23  Yes [provider]  ferrous sulfate 325 (65 FE) MG  tablet Take 325 mg by mouth daily.    [provider]  JARDIANCE 10 MG TABS tablet Oral for 90 Days    [provider]  lisinopril (ZESTRIL) 5 MG tablet Take 5 mg by mouth 2 (two) times daily.    [provider]  MYRBETRIQ 50 MG TB24 tablet Take 50 mg by mouth daily. 10/12/22   [provider]  pantoprazole (PROTONIX) 40 MG tablet Take 1 tablet (40 mg total) by mouth 2 (two) times daily. 09/10/22   Runell Gess, MD  polyethylene glycol (MIRALAX / GLYCOLAX) 17 g packet Take 17 g by mouth daily as needed for mild constipation. 05/11/23   Leroy Sea, MD  rosuvastatin (CRESTOR) 20 MG tablet Take 20 mg by mouth every evening.     [provider]  traMADol (ULTRAM) 50 MG tablet Take 1 tablet (50 mg total) by mouth every 12 (twelve) hours as needed. 05/11/23 05/10/24  Leroy Sea, MD  UNABLE TO FIND Take 1 tablet by mouth daily as needed (solidify bowel movements). Med Name: [place]    [provider]    Physical Exam: BP (!) 148/73 (BP Location: Right Arm)   Pulse (!) 101   Temp (!) 97.4 F (36.3 C) (Axillary)   Resp 20   Ht 5\' 5"  (1.651 m)   Wt 65 kg   SpO2 100%   BMI 23.85 kg/m   General: 83 y.o. year-old male well developed well nourished in no acute distress.  Alert and oriented x3.  Soft cervical collar in  place. Cardiovascular: Regular rate and rhythm with no rubs or gallops.  No thyromegaly or JVD noted.  No lower extremity edema. 2/4 pulses in all 4 extremities. Respiratory: Clear to auscultation with no wheezes or rales. Good inspiratory effort. Abdomen: Soft nontender nondistended with normal bowel sounds x4 quadrants. Muskuloskeletal: No cyanosis, clubbing or edema noted bilaterally Neuro: CN II-XII intact, strength, sensation, reflexes Skin: No ulcerative lesions noted or rashes.  Some bruising from his fall. Psychiatry: Judgement and insight appear normal. Mood is appropriate for condition and setting          Labs on Admission:  Basic Metabolic Panel: Recent Labs  Lab 10/02/23 1451 10/02/23 1522  NA 140 140  K 4.5 4.5  CL 105 109  CO2 21*  --   GLUCOSE 193* 191*  BUN 43* 42*  CREATININE 2.60* 2.80*  CALCIUM 9.3  --    Liver Function Tests: Recent Labs  Lab 10/02/23 1451  AST 23  ALT 19  ALKPHOS 71  BILITOT 0.5  PROT 7.3  ALBUMIN 3.7   No results for input(s): "LIPASE", "AMYLASE" in the last 168 hours. No results for input(s): "AMMONIA" in the last 168 hours. CBC: Recent Labs  Lab 10/02/23 1451 10/02/23 1522  WBC 12.0*  --   NEUTROABS 9.4*  --   HGB 10.6* 10.5*  HCT 33.3* 31.0*  MCV 90.7  --   PLT 306  --    Cardiac Enzymes: No results for input(s): "CKTOTAL", "CKMB", "CKMBINDEX", "TROPONINI" in the last 168 hours.  BNP (last 3 results) Recent Labs    05/09/23 0454 05/10/23 0358 05/11/23 0323  BNP 361.6* 286.5* 338.1*    ProBNP (last 3 results) No results for input(s): "PROBNP" in the last 8760 hours.  CBG: No results for input(s): "GLUCAP" in the last 168 hours.  Radiological Exams on Admission: MR Cervical Spine Wo Contrast Result Date: 10/02/2023 CLINICAL DATA:  Neck trauma, ligament injury suspected.  Fall from standing position. EXAM: MRI CERVICAL SPINE WITHOUT CONTRAST TECHNIQUE: Multiplanar, multisequence MR imaging of the cervical  spine was performed. No intravenous contrast was administered. COMPARISON:  CT of the cervical spine 10/02/2023. FINDINGS: Alignment: Straightening of the normal cervical lordosis is again noted. No significant listhesis is present. Vertebrae: Marrow signal and vertebral body heights are normal. Cord: The ventral cord is compressed C5-6 and C6-7 secondary to disc disease and ossification of the posterior longitudinal ligament. Posterior Fossa, vertebral arteries, paraspinal tissues: Craniocervical junction is normal. Flow is present in the vertebral arteries bilaterally. Visualized intracranial contents are normal. Edematous changes are present along the posterior spinous ligament from C2-C5. Diffuse edematous changes are present within the posterior paraspinous musculature bilaterally throughout the cervical spine. Disc levels: C2-3: A rightward disc osteophyte complex partially effaces the ventral CSF. Mild foraminal narrowing is present bilaterally. C3-4: A broad-based disc osteophyte complex is present. Partial effacement of ventral CSF is noted. Mild foraminal narrowing is present bilaterally. C4-5: Ossification of posterior longitudinal ligament partially effaces the ventral CSF. The foramina are patent bilaterally. C5-6: A left paramedian disc osteophyte complex and ossification of posterior longitudinal ligament compress the spinal cord to 5 mm. Moderate left and mild right foraminal stenosis is present. C6-7: A central disc protrusion and ossification of posterior longitudinal ligament compress the spinal cord 5 mm. Moderate foraminal stenosis is present bilaterally. C7-T1: A broad-based disc osteophyte complex is asymmetric to the right. This effaces the ventral CSF. Moderate right and mild left foraminal stenosis is present. IMPRESSION: 1. Edematous changes along the posterior spinous ligament from C2-C5 compatible with acute ligamentous injury. 2. Diffuse edematous changes within the posterior  paraspinous musculature bilaterally throughout the cervical spine compatible with muscle strain. 3. Multilevel spondylosis of the cervical spine as described. 4. 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 5. Moderate foraminal stenosis bilaterally at C6-7 and on the right at C7-T1. 6. Mild foraminal narrowing bilaterally at C2-3 and C3-4. These results were called by telephone at the time of interpretation on 10/02/2023 at 5:58 pm to provider [facility] , who verbally acknowledged these results. Electronically Signed   By: Marin Roberts M.D.   On: 10/02/2023 17:58   CT Thoracic Spine Wo Contrast Result Date: 10/02/2023 CLINICAL DATA:  Back trauma, no prior imaging (Age >= 16y) fall Fall from standing.  Back pain. EXAM: CT THORACIC SPINE WITHOUT CONTRAST TECHNIQUE: Multidetector CT images of the thoracic were obtained using the standard protocol without intravenous contrast. RADIATION DOSE REDUCTION: This exam was performed according to the departmental dose-optimization program which includes automated exposure control, adjustment of the mA and/or kV according to patient size and/or use of iterative reconstruction technique. COMPARISON:  None Available. FINDINGS: Alignment: Normal. Vertebrae: No acute fracture. Normal vertebral body heights. Posterior elements are intact. There is fusion of the spinous processes. Paraspinal and other soft tissues: No paraspinal hematoma. Retained mucus within the right mainstem bronchus extending into all lobar bronchi. Mucoid impaction in the right lower lobe bronchi, with progression from December 2024 chest CT. Additional areas of nodular airspace disease within both lower lobes have improved. Disc levels: Diffuse anterior spurring. Scattered ossification of the discs. Posterior spurring at T12-L1 causes spinal canal stenosis. IMPRESSION: 1. No acute fracture or subluxation of  the thoracic spine. 2. Retained mucus within the right mainstem bronchus extending into all lobar bronchi. Mucoid impaction in the right lower lobe bronchi, with  progression from December 2024 chest CT. Additional areas of nodular airspace disease within both lower lobes have improved from prior exam. Recommend correlation for aspiration risk factors. Electronically Signed   By: Narda Rutherford M.D.   On: 10/02/2023 16:14   CT Cervical Spine Wo Contrast Result Date: 10/02/2023 CLINICAL DATA:  Neck trauma (Age >= 65y) Standing.  Neck pain. EXAM: CT CERVICAL SPINE WITHOUT CONTRAST TECHNIQUE: Multidetector CT imaging of the cervical spine was performed without intravenous contrast. Multiplanar CT image reconstructions were also generated. RADIATION DOSE REDUCTION: This exam was performed according to the departmental dose-optimization program which includes automated exposure control, adjustment of the mA and/or kV according to patient size and/or use of iterative reconstruction technique. COMPARISON:  None Available. FINDINGS: Alignment: Straightening of normal lordosis. No traumatic subluxation. Skull base and vertebrae: No acute fracture. Vertebral body heights are maintained. The dens and skull base are intact. Soft tissues and spinal canal: No prevertebral fluid or swelling. No visible canal hematoma. Disc levels: There is diffuse ossification of the posterior longitudinal ligament extending from CT through C7. Additional a posterior disc osteophyte complex. Varying degrees of spinal canal stenosis. Upper chest: No acute findings.  Emphysema. Other: Carotid calcifications. IMPRESSION: 1. No acute fracture or subluxation of the cervical spine. 2. Diffuse ossification of the posterior longitudinal ligament extending from C2 through C7. Additional posterior disc osteophyte complex. Varying degrees of spinal canal stenosis. Consider further assessment with MRI as clinically indicated. Electronically Signed   By:  Narda Rutherford M.D.   On: 10/02/2023 16:08   CT Head Wo Contrast Result Date: 10/02/2023 CLINICAL DATA:  Head trauma, minor (Age >= 65y) Fall from standing. EXAM: CT HEAD WITHOUT CONTRAST TECHNIQUE: Contiguous axial images were obtained from the base of the skull through the vertex without intravenous contrast. RADIATION DOSE REDUCTION: This exam was performed according to the departmental dose-optimization program which includes automated exposure control, adjustment of the mA and/or kV according to patient size and/or use of iterative reconstruction technique. COMPARISON:  None Available. FINDINGS: Brain: No intracranial hemorrhage, mass effect, or midline shift. Age related atrophy per no hydrocephalus. The basilar cisterns are patent. Mild for age periventricular chronic small vessel ischemia. No evidence of territorial infarct or acute ischemia. No extra-axial or intracranial fluid collection. Vascular: Atherosclerosis of skullbase vasculature without hyperdense vessel or abnormal calcification. Skull: No fracture or focal lesion. Sinuses/Orbits: No acute finding.  Bilateral cataract resection. Other: No confluent scalp hematoma. IMPRESSION: 1. No acute intracranial abnormality. No skull fracture. 2. Age related atrophy and chronic small vessel ischemia. Electronically Signed   By: Narda Rutherford M.D.   On: 10/02/2023 16:03   DG Forearm Right Result Date: 10/02/2023 CLINICAL DATA:  Fall.  Pain. EXAM: RIGHT FOREARM - 2 VIEW COMPARISON:  None Available. FINDINGS: No evidence of acute fracture. Mild degenerative change of the wrist and elbow, no dislocation. No erosions or focal bone abnormality. Vascular calcifications are seen. There is focal soft tissue prominence overlying the mid forearm. IMPRESSION: Focal soft tissue prominence overlying the mid forearm. No acute fracture or subluxation. Electronically Signed   By: Narda Rutherford M.D.   On: 10/02/2023 15:20   DG Pelvis Portable Result Date:  10/02/2023 CLINICAL DATA:  Fall. EXAM: PORTABLE PELVIS 1-2 VIEWS COMPARISON:  None Available. FINDINGS: The intertrochanteric and lateral right femur not included in the field of view. No evidence of pelvic fracture. Pubic rami are intact. No hip dislocation. Moderate bilateral hip osteoarthritis. Pubic symphysis and sacroiliac joints are congruent. Bilateral  iliac stents. Right femoral stent is partially included. IMPRESSION: 1. No pelvic fracture. 2. Moderate bilateral hip osteoarthritis. Electronically Signed   By: Narda Rutherford M.D.   On: 10/02/2023 15:18   DG Chest Port 1 View Result Date: 10/02/2023 CLINICAL DATA:  Fall. EXAM: PORTABLE CHEST 1 VIEW COMPARISON:  Radiograph 05/10/2023.  CT 06/07/2023 FINDINGS: The cardiomediastinal contours are normal. The lungs are clear. Pulmonary vasculature is normal. No consolidation, pleural effusion, or pneumothorax. Reverse right shoulder arthroplasty. On limited assessment, no acute osseous finding. IMPRESSION: No active disease. Electronically Signed   By: Narda Rutherford M.D.   On: 10/02/2023 15:16    EKG: I independently viewed the EKG done and my findings are as followed: Sinus rhythm rate of 86.  Nonspecific ST-T changes.  QTc 506.  RBBB.  Assessment/Plan Present on Admission:  Fall  Principal Problem:   Fall  Fall, mechanical The patient lost his balance States has had balance issues for years due to his peripheral artery disease Trauma imaging with results as stated above Will benefit from PT OT at home PT OT evaluation with the guidance of neurosurgery. Fall precautions  AKI on CKD 4 Baseline creatinine appears to be 2.4 with GFR 26 Presented with creatinine of 2.80 with GFR 24 Avoid nephrotoxic agent, dehydration, and hypotension Gentle IV fluid hydration LR 50 cc/h x 1 day Hold off home Jardiance and lisinopril for now Monitor urine output Repeat BMP in the morning.  Leukocytosis, could be reactive from his fall Chest x-ray  with no active disease Urine was not obtained due to no reported lower urinary tract symptoms. Repeat CBC in the morning  Peripheral artery disease Resume home Plavix  Chronic anxiety/depression Resume home Cymbalta  Iron deficiency anemia Resume home iron supplement  Type 2 diabetes with hyperglycemia Hemoglobin A1c 7.0 on 05/10/2023. Repeat A1c Heart healthy carb modified diet Insulin sliding scale  Hypertension Resume home oral antihypertensives as blood pressures allow Hold off lisinopril due to AKI Monitor vital signs  Chronic HFpEF Euvolemic on exam Last 2D echo done on 06/20/2018 revealed LVEF 60-65% Monitor strict I's and O's and daily weight  GERD Resume home Protonix  Hyperlipidemia Resume home Crestor  Ambulatory dysfunction Walks with a walker at baseline PT OT evaluation under the guidance of neurosurgery. Fall precautions.    Time: 75 minutes.    DVT prophylaxis: SCDs.  Code Status: Full code.  Family Communication: Updated the patient's wife at bedside.  Disposition Plan: Admitted to telemetry surgical unit.  Consults called: EDP discussed the case with neurosurgery on-call.  Admission status: Observation status.   Status is: Observation    Darlin Drop MD [facility] Pager [phone]  If 7PM-7AM, please contact night-coverage [email or web address] Password [facility]  10/02/2023, 10:44 PM

## 2023-10-03 ENCOUNTER — Other Ambulatory Visit: Payer: Self-pay

## 2023-10-03 DIAGNOSIS — G959 Disease of spinal cord, unspecified: Secondary | ICD-10-CM | POA: Diagnosis not present

## 2023-10-03 DIAGNOSIS — G928 Other toxic encephalopathy: Secondary | ICD-10-CM | POA: Diagnosis not present

## 2023-10-03 DIAGNOSIS — I5032 Chronic diastolic (congestive) heart failure: Secondary | ICD-10-CM

## 2023-10-03 DIAGNOSIS — M7501 Adhesive capsulitis of right shoulder: Secondary | ICD-10-CM | POA: Diagnosis present

## 2023-10-03 DIAGNOSIS — I452 Bifascicular block: Secondary | ICD-10-CM | POA: Diagnosis present

## 2023-10-03 DIAGNOSIS — N1832 Chronic kidney disease, stage 3b: Secondary | ICD-10-CM | POA: Diagnosis present

## 2023-10-03 DIAGNOSIS — M549 Dorsalgia, unspecified: Secondary | ICD-10-CM | POA: Diagnosis present

## 2023-10-03 DIAGNOSIS — E1122 Type 2 diabetes mellitus with diabetic chronic kidney disease: Secondary | ICD-10-CM | POA: Diagnosis present

## 2023-10-03 DIAGNOSIS — Z981 Arthrodesis status: Secondary | ICD-10-CM | POA: Diagnosis not present

## 2023-10-03 DIAGNOSIS — I708 Atherosclerosis of other arteries: Secondary | ICD-10-CM | POA: Diagnosis present

## 2023-10-03 DIAGNOSIS — I129 Hypertensive chronic kidney disease with stage 1 through stage 4 chronic kidney disease, or unspecified chronic kidney disease: Secondary | ICD-10-CM | POA: Diagnosis present

## 2023-10-03 DIAGNOSIS — I10 Essential (primary) hypertension: Secondary | ICD-10-CM | POA: Diagnosis present

## 2023-10-03 DIAGNOSIS — Y93E8 Activity, other personal hygiene: Secondary | ICD-10-CM | POA: Diagnosis not present

## 2023-10-03 DIAGNOSIS — Z794 Long term (current) use of insulin: Secondary | ICD-10-CM | POA: Diagnosis not present

## 2023-10-03 DIAGNOSIS — K592 Neurogenic bowel, not elsewhere classified: Secondary | ICD-10-CM | POA: Diagnosis not present

## 2023-10-03 DIAGNOSIS — E43 Unspecified severe protein-calorie malnutrition: Secondary | ICD-10-CM | POA: Diagnosis present

## 2023-10-03 DIAGNOSIS — F4323 Adjustment disorder with mixed anxiety and depressed mood: Secondary | ICD-10-CM | POA: Diagnosis not present

## 2023-10-03 DIAGNOSIS — S139XXA Sprain of joints and ligaments of unspecified parts of neck, initial encounter: Secondary | ICD-10-CM | POA: Diagnosis not present

## 2023-10-03 DIAGNOSIS — E1142 Type 2 diabetes mellitus with diabetic polyneuropathy: Secondary | ICD-10-CM | POA: Diagnosis present

## 2023-10-03 DIAGNOSIS — I615 Nontraumatic intracerebral hemorrhage, intraventricular: Secondary | ICD-10-CM | POA: Diagnosis not present

## 2023-10-03 DIAGNOSIS — G8250 Quadriplegia, unspecified: Secondary | ICD-10-CM | POA: Diagnosis not present

## 2023-10-03 DIAGNOSIS — N368 Other specified disorders of urethra: Secondary | ICD-10-CM | POA: Diagnosis not present

## 2023-10-03 DIAGNOSIS — L89616 Pressure-induced deep tissue damage of right heel: Secondary | ICD-10-CM | POA: Diagnosis present

## 2023-10-03 DIAGNOSIS — G8929 Other chronic pain: Secondary | ICD-10-CM | POA: Diagnosis present

## 2023-10-03 DIAGNOSIS — W2201XD Walked into wall, subsequent encounter: Secondary | ICD-10-CM | POA: Diagnosis not present

## 2023-10-03 DIAGNOSIS — I6782 Cerebral ischemia: Secondary | ICD-10-CM | POA: Diagnosis not present

## 2023-10-03 DIAGNOSIS — M542 Cervicalgia: Secondary | ICD-10-CM | POA: Diagnosis present

## 2023-10-03 DIAGNOSIS — I951 Orthostatic hypotension: Secondary | ICD-10-CM | POA: Diagnosis not present

## 2023-10-03 DIAGNOSIS — G9520 Unspecified cord compression: Secondary | ICD-10-CM | POA: Diagnosis not present

## 2023-10-03 DIAGNOSIS — Z95820 Peripheral vascular angioplasty status with implants and grafts: Secondary | ICD-10-CM | POA: Diagnosis not present

## 2023-10-03 DIAGNOSIS — R52 Pain, unspecified: Secondary | ICD-10-CM | POA: Diagnosis not present

## 2023-10-03 DIAGNOSIS — Z66 Do not resuscitate: Secondary | ICD-10-CM | POA: Diagnosis present

## 2023-10-03 DIAGNOSIS — Z515 Encounter for palliative care: Secondary | ICD-10-CM | POA: Diagnosis not present

## 2023-10-03 DIAGNOSIS — Z4789 Encounter for other orthopedic aftercare: Secondary | ICD-10-CM | POA: Diagnosis present

## 2023-10-03 DIAGNOSIS — R252 Cramp and spasm: Secondary | ICD-10-CM | POA: Diagnosis not present

## 2023-10-03 DIAGNOSIS — Z7189 Other specified counseling: Secondary | ICD-10-CM | POA: Diagnosis not present

## 2023-10-03 DIAGNOSIS — R262 Difficulty in walking, not elsewhere classified: Secondary | ICD-10-CM

## 2023-10-03 DIAGNOSIS — W1839XA Other fall on same level, initial encounter: Secondary | ICD-10-CM | POA: Diagnosis present

## 2023-10-03 DIAGNOSIS — Z4682 Encounter for fitting and adjustment of non-vascular catheter: Secondary | ICD-10-CM | POA: Diagnosis not present

## 2023-10-03 DIAGNOSIS — N179 Acute kidney failure, unspecified: Secondary | ICD-10-CM | POA: Diagnosis present

## 2023-10-03 DIAGNOSIS — N184 Chronic kidney disease, stage 4 (severe): Secondary | ICD-10-CM

## 2023-10-03 DIAGNOSIS — M19012 Primary osteoarthritis, left shoulder: Secondary | ICD-10-CM | POA: Diagnosis not present

## 2023-10-03 DIAGNOSIS — D72829 Elevated white blood cell count, unspecified: Secondary | ICD-10-CM | POA: Diagnosis present

## 2023-10-03 DIAGNOSIS — D631 Anemia in chronic kidney disease: Secondary | ICD-10-CM | POA: Diagnosis present

## 2023-10-03 DIAGNOSIS — M4802 Spinal stenosis, cervical region: Secondary | ICD-10-CM | POA: Diagnosis not present

## 2023-10-03 DIAGNOSIS — R443 Hallucinations, unspecified: Secondary | ICD-10-CM | POA: Diagnosis not present

## 2023-10-03 DIAGNOSIS — S3730XA Unspecified injury of urethra, initial encounter: Secondary | ICD-10-CM | POA: Diagnosis not present

## 2023-10-03 DIAGNOSIS — I609 Nontraumatic subarachnoid hemorrhage, unspecified: Secondary | ICD-10-CM | POA: Diagnosis not present

## 2023-10-03 DIAGNOSIS — J9811 Atelectasis: Secondary | ICD-10-CM | POA: Diagnosis present

## 2023-10-03 DIAGNOSIS — I130 Hypertensive heart and chronic kidney disease with heart failure and stage 1 through stage 4 chronic kidney disease, or unspecified chronic kidney disease: Secondary | ICD-10-CM | POA: Diagnosis present

## 2023-10-03 DIAGNOSIS — R1310 Dysphagia, unspecified: Secondary | ICD-10-CM | POA: Diagnosis not present

## 2023-10-03 DIAGNOSIS — I959 Hypotension, unspecified: Secondary | ICD-10-CM | POA: Diagnosis present

## 2023-10-03 DIAGNOSIS — M488X2 Other specified spondylopathies, cervical region: Secondary | ICD-10-CM | POA: Diagnosis present

## 2023-10-03 DIAGNOSIS — Z87891 Personal history of nicotine dependence: Secondary | ICD-10-CM | POA: Diagnosis not present

## 2023-10-03 DIAGNOSIS — F418 Other specified anxiety disorders: Secondary | ICD-10-CM | POA: Diagnosis not present

## 2023-10-03 DIAGNOSIS — E1151 Type 2 diabetes mellitus with diabetic peripheral angiopathy without gangrene: Secondary | ICD-10-CM | POA: Diagnosis present

## 2023-10-03 DIAGNOSIS — F05 Delirium due to known physiological condition: Secondary | ICD-10-CM | POA: Diagnosis not present

## 2023-10-03 DIAGNOSIS — M47812 Spondylosis without myelopathy or radiculopathy, cervical region: Secondary | ICD-10-CM | POA: Diagnosis not present

## 2023-10-03 DIAGNOSIS — G8254 Quadriplegia, C5-C7 incomplete: Secondary | ICD-10-CM | POA: Diagnosis present

## 2023-10-03 DIAGNOSIS — N319 Neuromuscular dysfunction of bladder, unspecified: Secondary | ICD-10-CM | POA: Diagnosis not present

## 2023-10-03 DIAGNOSIS — E1165 Type 2 diabetes mellitus with hyperglycemia: Secondary | ICD-10-CM | POA: Diagnosis present

## 2023-10-03 DIAGNOSIS — W19XXXA Unspecified fall, initial encounter: Secondary | ICD-10-CM | POA: Diagnosis not present

## 2023-10-03 DIAGNOSIS — Y846 Urinary catheterization as the cause of abnormal reaction of the patient, or of later complication, without mention of misadventure at the time of the procedure: Secondary | ICD-10-CM | POA: Diagnosis not present

## 2023-10-03 DIAGNOSIS — R2681 Unsteadiness on feet: Secondary | ICD-10-CM | POA: Diagnosis not present

## 2023-10-03 DIAGNOSIS — Z96611 Presence of right artificial shoulder joint: Secondary | ICD-10-CM | POA: Diagnosis present

## 2023-10-03 DIAGNOSIS — E785 Hyperlipidemia, unspecified: Secondary | ICD-10-CM | POA: Diagnosis present

## 2023-10-03 DIAGNOSIS — F11921 Opioid use, unspecified with intoxication delirium: Secondary | ICD-10-CM | POA: Diagnosis not present

## 2023-10-03 DIAGNOSIS — F32A Depression, unspecified: Secondary | ICD-10-CM | POA: Diagnosis present

## 2023-10-03 DIAGNOSIS — M24511 Contracture, right shoulder: Secondary | ICD-10-CM | POA: Diagnosis not present

## 2023-10-03 DIAGNOSIS — Y92013 Bedroom of single-family (private) house as the place of occurrence of the external cause: Secondary | ICD-10-CM | POA: Diagnosis not present

## 2023-10-03 DIAGNOSIS — R339 Retention of urine, unspecified: Secondary | ICD-10-CM | POA: Diagnosis not present

## 2023-10-03 DIAGNOSIS — R0989 Other specified symptoms and signs involving the circulatory and respiratory systems: Secondary | ICD-10-CM | POA: Diagnosis not present

## 2023-10-03 DIAGNOSIS — M4712 Other spondylosis with myelopathy, cervical region: Secondary | ICD-10-CM | POA: Diagnosis present

## 2023-10-03 DIAGNOSIS — M50022 Cervical disc disorder at C5-C6 level with myelopathy: Secondary | ICD-10-CM | POA: Diagnosis present

## 2023-10-03 DIAGNOSIS — M4322 Fusion of spine, cervical region: Secondary | ICD-10-CM | POA: Diagnosis not present

## 2023-10-03 DIAGNOSIS — M2578 Osteophyte, vertebrae: Secondary | ICD-10-CM | POA: Diagnosis present

## 2023-10-03 DIAGNOSIS — L89152 Pressure ulcer of sacral region, stage 2: Secondary | ICD-10-CM | POA: Diagnosis present

## 2023-10-03 DIAGNOSIS — K219 Gastro-esophageal reflux disease without esophagitis: Secondary | ICD-10-CM | POA: Diagnosis present

## 2023-10-03 DIAGNOSIS — R739 Hyperglycemia, unspecified: Secondary | ICD-10-CM | POA: Diagnosis not present

## 2023-10-03 DIAGNOSIS — R410 Disorientation, unspecified: Secondary | ICD-10-CM | POA: Diagnosis not present

## 2023-10-03 DIAGNOSIS — M25512 Pain in left shoulder: Secondary | ICD-10-CM | POA: Diagnosis not present

## 2023-10-03 DIAGNOSIS — W19XXXD Unspecified fall, subsequent encounter: Secondary | ICD-10-CM | POA: Diagnosis present

## 2023-10-03 DIAGNOSIS — F54 Psychological and behavioral factors associated with disorders or diseases classified elsewhere: Secondary | ICD-10-CM | POA: Diagnosis not present

## 2023-10-03 LAB — CBC
HCT: 31.4 % — ABNORMAL LOW (ref 39.0–52.0)
Hemoglobin: 10.6 g/dL — ABNORMAL LOW (ref 13.0–17.0)
MCH: 29.4 pg (ref 26.0–34.0)
MCHC: 33.8 g/dL (ref 30.0–36.0)
MCV: 87.2 fL (ref 80.0–100.0)
Platelets: 276 10*3/uL (ref 150–400)
RBC: 3.6 MIL/uL — ABNORMAL LOW (ref 4.22–5.81)
RDW: 13.5 % (ref 11.5–15.5)
WBC: 10.2 10*3/uL (ref 4.0–10.5)
nRBC: 0 % (ref 0.0–0.2)

## 2023-10-03 LAB — BASIC METABOLIC PANEL WITH GFR
Anion gap: 10 (ref 5–15)
BUN: 38 mg/dL — ABNORMAL HIGH (ref 8–23)
CO2: 24 mmol/L (ref 22–32)
Calcium: 9.2 mg/dL (ref 8.9–10.3)
Chloride: 108 mmol/L (ref 98–111)
Creatinine, Ser: 2.23 mg/dL — ABNORMAL HIGH (ref 0.61–1.24)
GFR, Estimated: 29 mL/min — ABNORMAL LOW (ref >60)
Glucose, Bld: 121 mg/dL — ABNORMAL HIGH (ref 70–99)
Potassium: 4.5 mmol/L (ref 3.5–5.1)
Sodium: 142 mmol/L (ref 135–145)

## 2023-10-03 LAB — GLUCOSE, CAPILLARY
Glucose-Capillary: 107 mg/dL — ABNORMAL HIGH (ref 70–99)
Glucose-Capillary: 108 mg/dL — ABNORMAL HIGH (ref 70–99)
Glucose-Capillary: 120 mg/dL — ABNORMAL HIGH (ref 70–99)
Glucose-Capillary: 142 mg/dL — ABNORMAL HIGH (ref 70–99)
Glucose-Capillary: 170 mg/dL — ABNORMAL HIGH (ref 70–99)

## 2023-10-03 LAB — PHOSPHORUS: Phosphorus: 3.8 mg/dL (ref 2.5–4.6)

## 2023-10-03 LAB — MAGNESIUM: Magnesium: 1.9 mg/dL (ref 1.7–2.4)

## 2023-10-03 MED ORDER — PREGABALIN 25 MG PO CAPS
50.0000 mg | ORAL_CAPSULE | Freq: Two times a day (BID) | ORAL | Status: DC
  Administered 2023-10-03 – 2023-10-18 (×28): 50 mg via ORAL
  Filled 2023-10-03 (×28): qty 2

## 2023-10-03 MED ORDER — MIRABEGRON ER 50 MG PO TB24
50.0000 mg | ORAL_TABLET | Freq: Every day | ORAL | Status: DC
  Administered 2023-10-03 – 2023-10-18 (×15): 50 mg via ORAL
  Filled 2023-10-03 (×16): qty 1

## 2023-10-03 MED ORDER — TRAMADOL HCL 50 MG PO TABS
50.0000 mg | ORAL_TABLET | Freq: Four times a day (QID) | ORAL | Status: DC | PRN
  Administered 2023-10-03: 50 mg via ORAL
  Administered 2023-10-03 – 2023-10-04 (×2): 100 mg via ORAL
  Administered 2023-10-05: 50 mg via ORAL
  Filled 2023-10-03 (×3): qty 2
  Filled 2023-10-03: qty 1
  Filled 2023-10-03: qty 2

## 2023-10-03 MED ORDER — OXYCODONE HCL 5 MG PO TABS
2.5000 mg | ORAL_TABLET | ORAL | Status: DC | PRN
  Administered 2023-10-03 – 2023-10-04 (×2): 2.5 mg via ORAL
  Filled 2023-10-03 (×2): qty 1

## 2023-10-03 MED ORDER — CYCLOBENZAPRINE HCL 5 MG PO TABS
5.0000 mg | ORAL_TABLET | Freq: Three times a day (TID) | ORAL | Status: DC | PRN
  Administered 2023-10-04 – 2023-10-15 (×7): 5 mg via ORAL
  Filled 2023-10-03 (×8): qty 1

## 2023-10-03 NOTE — Plan of Care (Signed)
  Problem: Education: Goal: Individualized Educational Video(s) Outcome: Progressing   Problem: Coping: Goal: Ability to adjust to condition or change in health will improve Outcome: Progressing   Problem: Fluid Volume: Goal: Ability to maintain a balanced intake and output will improve Outcome: Progressing   Problem: Health Behavior/Discharge Planning: Goal: Ability to identify and utilize available resources and services will improve Outcome: Progressing   Problem: Metabolic: Goal: Ability to maintain appropriate glucose levels will improve Outcome: Progressing   Problem: Skin Integrity: Goal: Risk for impaired skin integrity will decrease Outcome: Progressing   Problem: Tissue Perfusion: Goal: Adequacy of tissue perfusion will improve Outcome: Progressing   Problem: Education: Goal: Knowledge of General Education information will improve Description: Including pain rating scale, medication(s)/side effects and non-pharmacologic comfort measures Outcome: Progressing   Problem: Clinical Measurements: Goal: Will remain free from infection Outcome: Progressing Goal: Diagnostic test results will improve Outcome: Progressing Goal: Respiratory complications will improve Outcome: Progressing Goal: Cardiovascular complication will be avoided Outcome: Progressing

## 2023-10-03 NOTE — Evaluation (Signed)
 Physical Therapy Evaluation Patient Details Name: Christian Bailey MRN: [id number] DOB: May 20, 1941 Today's Date: 10/03/2023  History of Present Illness  Patient is an 83 yo male presenting to the ED status post fall on 10/02/23. Patient hit head in fall, with CT of head and spine clear. MRI finding edematous changes from C2-C5. 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  Moderate foraminal stenosis bilaterally at C6-7 and on the right at C7-T1.  Mild foraminal narrowing bilaterally at C2-3 and C3-4. PMH includes:  peripheral artery disease on Plavix and Crestor, RBBB, chronic HFpEF, CKD 4, carotid stenosis, hypertension, hyperlipidemia, type 2 diabetes, chronic anxiety/depression, iron deficiency anemia, GERD   Clinical Impression  Pt admitted with above diagnosis. PTA pt lived at home with wife, requiring assist for household mobility with ADLs. Frequent falls. He reports continual, progressive weakness over last several months. Pt currently with functional limitations due to the deficits listed below (see PT Problem List). On eval, pt required mod assist rolling, +2 mod assist supine <> sit, and +2 min/HHA sit to stand. Poor sitting and standing balance. Pt unable to take steps for forward gait progression or sidestepping. Mobility significantly limited by pain. During transition sit to sidelying, pt reporting an 'electric shock' down BLE. In bed, pt only tolerating HOB up to 10 degrees. Pt will benefit from acute skilled PT to increase their independence and safety with mobility to allow discharge. Post acute pt would benefit from further therapy in inpatient setting < 3 hours/day. However, pt is declining and prefers home with wife. If pt returns home, recommend max HH services, PTAR transport, and below noted DME.          If plan is discharge home, recommend the following: Two people to help with walking and/or  transfers;Two people to help with bathing/dressing/bathroom;Help with stairs or ramp for entrance;Assist for transportation;Assistance with cooking/housework   Can travel by private vehicle   No    Equipment Recommendations Wheelchair (measurements PT);Wheelchair cushion (measurements PT)  Recommendations for Other Services   (Pt declining. Prefers home with [facility].)    Functional Status Assessment Patient has had a recent decline in their functional status and demonstrates the ability to make significant improvements in function in a reasonable and predictable amount of time.     Precautions / Restrictions Precautions Required Braces or Orthoses: Cervical Brace Cervical Brace: Soft collar (no wear schedule specified)      Mobility  Bed Mobility Overal bed mobility: Needs Assistance Bed Mobility: Rolling, Sidelying to Sit, Sit to Sidelying Rolling: Mod assist Sidelying to sit: +2 for physical assistance, Mod assist, Used rails     Sit to sidelying: +2 for physical assistance, Mod assist General bed mobility comments: Pt only able to tolerate HOB at 10 degrees or lower, increased time, assist with trunk and BLE    Transfers Overall transfer level: Needs assistance Equipment used: 2 person hand held assist Transfers: Sit to/from Stand Sit to Stand: +2 physical assistance, Min assist, From elevated surface           General transfer comment: power up to stand with bilat 2-person assist    Ambulation/Gait               General Gait Details: unable to sidestep or progress forward gait due to weakness and pain  Stairs            Wheelchair Mobility  Tilt Bed    Modified Rankin (Stroke Patients Only)       Balance Overall balance assessment: Needs assistance Sitting-balance support: Bilateral upper extremity supported, Feet supported Sitting balance-Leahy Scale: Poor Sitting balance - Comments: frequent LOB posteriorly sitting EOB Postural control:  Posterior lean Standing balance support: Bilateral upper extremity supported Standing balance-Leahy Scale: Poor Standing balance comment: static stand 2-person assist                             Pertinent Vitals/Pain Pain Assessment Pain Assessment: Faces Faces Pain Scale: Hurts worst Pain Location: neck/back Pain Descriptors / Indicators: Grimacing, Guarding, Moaning, Pins and needles, Radiating Pain Intervention(s): Limited activity within patient's tolerance, Monitored during session, Repositioned, Patient requesting pain meds-RN notified    Home Living Family/patient expects to be discharged to:: Private residence Living Arrangements: Spouse/significant other Available Help at Discharge: Family;Available 24 hours/day Type of Home: House Home Access: Stairs to enter Entrance Stairs-Rails: Left Entrance Stairs-Number of Steps: 3   Home Layout: One level Home Equipment: Agricultural consultant (2 wheels);Rollator (4 wheels);Shower seat - built Scientist, clinical (histocompatibility and immunogenetics)      Prior Function Prior Level of Function : Needs assist;History of Falls (last six months)             Mobility Comments: pt using RW in home, rollator in community. sideways on stairs for BUE support on rail. reports sit-stand can take 5-6 tries ADLs Comments: wife recently has helped with sponge bath, typically pt gets in shower. Pt recently fell putting on underwear while standing. Pt reports use of sock aid to donn socks     Extremity/Trunk Assessment   Upper Extremity Assessment Upper Extremity Assessment: Defer to OT evaluation    Lower Extremity Assessment Lower Extremity Assessment: Generalized weakness (chronic sensory deficits from PAD)    Cervical / Trunk Assessment Cervical / Trunk Assessment: Other exceptions Cervical / Trunk Exceptions: cervical ligamentous injury  Communication   Communication Communication: No apparent difficulties    Cognition Arousal: Alert Behavior During  Therapy: WFL for tasks assessed/performed   PT - Cognitive impairments: Awareness, Problem solving, Safety/Judgement                       PT - Cognition Comments: internally distracted by pain. Poor insight. Initially stating "I can't get out of the bed," then reporting he felt like he would be able to get in a car to d/c home with wife. Following commands: Impaired Following commands impaired: Follows one step commands inconsistently, Follows one step commands with increased time (distracted by pain)     Cueing       General Comments General comments (skin integrity, edema, etc.): HR 111    Exercises     Assessment/Plan    PT Assessment Patient needs continued PT services  PT Problem List Decreased strength;Decreased balance;Decreased knowledge of precautions;Pain;Decreased mobility;Decreased activity tolerance;Decreased safety awareness       PT Treatment Interventions DME instruction;Functional mobility training;Balance training;Patient/family education;Gait training;Therapeutic activities;Stair training;Therapeutic exercise    PT Goals (Current goals can be found in the Care Plan section)  Acute Rehab PT Goals Patient Stated Goal: home PT Goal Formulation: With patient Time For Goal Achievement: 10/17/23 Potential to Achieve Goals: Good    Frequency Min 2X/week     Co-evaluation   Reason for Co-Treatment: For patient/therapist safety;To address functional/ADL transfers PT goals addressed during session: Mobility/safety with mobility;Balance  AM-PAC PT "6 Clicks" Mobility  Outcome Measure Help needed turning from your back to your side while in a flat bed without using bedrails?: A Lot Help needed moving from lying on your back to sitting on the side of a flat bed without using bedrails?: Total Help needed moving to and from a bed to a chair (including a wheelchair)?: Total Help needed standing up from a chair using your arms (e.g., wheelchair or  bedside chair)?: Total Help needed to walk in hospital room?: Total Help needed climbing 3-5 steps with a railing? : Total 6 Click Score: 7    End of Session Equipment Utilized During Treatment: Gait belt;Cervical collar Activity Tolerance: Patient limited by pain Patient left: in bed;with call bell/phone within reach;with bed alarm set Nurse Communication: Mobility status;Patient requests pain meds PT Visit Diagnosis: Other abnormalities of gait and mobility (R26.89);Muscle weakness (generalized) (M62.81);Repeated falls (R29.6);Pain    Time: 8657-8469 PT Time Calculation (min) (ACUTE ONLY): 21 min   Charges:   PT Evaluation $PT Eval Moderate Complexity: 1 Mod   PT General Charges $$ ACUTE PT VISIT: 1 Visit         Ferd Glassing., PT  Office # [phone]   Ilda Foil 10/03/2023, 11:32 AM

## 2023-10-03 NOTE — Progress Notes (Signed)
 PROGRESS NOTE    Keshawn Fiorito  HQI:[id number] DOB: 06/12/1941 DOA: 10/02/2023 PCP: Chilton Greathouse, MD   Brief Narrative:  Laurance Heide is a 83 y.o. male with medical history significant for peripheral artery disease on Plavix and Crestor, RBBB, chronic HFpEF, CKD 4, ambulatory dysfunction (uses a walker at baseline), carotid stenosis, hypertension, hyperlipidemia, type 2 diabetes, chronic anxiety/depression, iron deficiency anemia, GERD, presents to the ER after a fall.   Patient has what appears to be acute on chronic ambulatory dysfunction with fall, and intake patient's imaging was unremarkable for any acute fracture, imaging did indicate posterior spinous ligament injury from C2-C5 as well as diffuse muscle strain in the area.  No other acute findings.  Neurosurgery consulted recommending soft collar, hospitalist called for admission due to ongoing ambulatory dysfunction.  Assessment & Plan:   Principal Problem:   Fall   Acute on chronic ambulatory dysfunction Mechanical fall with trauma -Imaging as above negative for fracture but notable for spinous ligament injury/tear, neurosurgery recommending soft collar with follow-up in 4 to 6 weeks -Patient's ambulatory status continues to be limited by pain(see below) -PT OT to follow for recommendations, potentially discharge home with therapy versus SNF pending needs  Acute intractable pain Peripheral arterial disease -Increase patient's regimen including tramadol, low-dose oxycodone, Flexeril, pregabalin -Patient does have some aspect of chronic pain somewhat well-controlled at home on duloxetine and over-the-counter NSAIDs -Patient complaining of "shooting pain" versus muscle spasms in his lower extremities, continue Flexeril and pregabalin as above  Leukocytosis, reactive   CKD 4 baseline -patient does not meet criteria for AKI given baseline - continue to increase p.o. intake as appropriate, DC fluids to avoid volume  overload Anxiety/depression-continue Cymbalta Hypertension-currently holding metoprolol, lisinopril given above -resume as appropriate Diabetes type 2 uncontrolled with hyperglycemia -A1c 8.0, continue sliding scale insulin hypoglycemic protocol HFpEF, without acute exacerbation -not on diuretics, hold metoprolol in the setting of hypotension  DVT prophylaxis: SCDs Start: 10/02/23 2244 Code Status:   Code Status: Full Code Family Communication: Wife updated at bedside  Status is: Inpatient  Dispo: The patient is from: Home              Anticipated d/c is to: To be determined              Anticipated d/c date is: 24 to 48 hours              Patient currently not medically stable for discharge in the setting of intractable pain and ambulatory dysfunction  Consultants:  Neurosurgery  Procedures:  None  Antimicrobials:  None indicated  Subjective: No acute issues or events overnight, pain continues to be ongoing without appropriate control, discussed additional medications as above, patient agreeable.  Otherwise complaining of sharp leg pains that are "shocking" in nature as well as spasms in the same area.  Objective: Vitals:   10/02/23 1630 10/02/23 1915 10/02/23 2307 10/03/23 1001  BP: (!) 157/87 (!) 148/73 127/80 (!) 151/70  Pulse: 98 (!) 101 86 (!) 105  Resp: 16 20  18   Temp:  (!) 97.4 F (36.3 C)  (!) 97.5 F (36.4 C)  TempSrc:  Axillary  Oral  SpO2: 100% 100% 94% 98%  Weight:      Height:        Intake/Output Summary (Last 24 hours) at 10/03/2023 1334 Last data filed at 10/03/2023 1300 Gross per 24 hour  Intake --  Output 700 ml  Net -700 ml   Filed Weights   10/02/23 1509  Weight: 65 kg    Examination:  General: Moderate distress resting in bed appears very uncomfortable and diaphoretic. Neck: Soft collar noted Lungs:  Clear to auscultate bilaterally without rhonchi, wheeze, or rales. Heart: Tachycardic without murmur rubs or gallops Abdomen:  Soft,  nontender, nondistended.  Without guarding or rebound. Extremities: Without cyanosis, clubbing, edema Skin:  Warm and dry, no erythema.   Data Reviewed: I have personally reviewed following labs and imaging studies  CBC: Recent Labs  Lab 10/02/23 1451 10/02/23 1522 10/03/23 0558  WBC 12.0*  --  10.2  NEUTROABS 9.4*  --   --   HGB 10.6* 10.5* 10.6*  HCT 33.3* 31.0* 31.4*  MCV 90.7  --  87.2  PLT 306  --  276   Basic Metabolic Panel: Recent Labs  Lab 10/02/23 1451 10/02/23 1522 10/03/23 0558  NA 140 140 142  K 4.5 4.5 4.5  CL 105 109 108  CO2 21*  --  24  GLUCOSE 193* 191* 121*  BUN 43* 42* 38*  CREATININE 2.60* 2.80* 2.23*  CALCIUM 9.3  --  9.2  MG  --   --  1.9  PHOS  --   --  3.8   GFR: Estimated Creatinine Clearance: 22.2 mL/min (A) (by C-G formula based on SCr of 2.23 mg/dL (H)). Liver Function Tests: Recent Labs  Lab 10/02/23 1451  AST 23  ALT 19  ALKPHOS 71  BILITOT 0.5  PROT 7.3  ALBUMIN 3.7   No results for input(s): "LIPASE", "AMYLASE" in the last 168 hours. No results for input(s): "AMMONIA" in the last 168 hours. Coagulation Profile: No results for input(s): "INR", "PROTIME" in the last 168 hours. Cardiac Enzymes: No results for input(s): "CKTOTAL", "CKMB", "CKMBINDEX", "TROPONINI" in the last 168 hours. BNP (last 3 results) No results for input(s): "PROBNP" in the last 8760 hours. HbA1C: Recent Labs    10/02/23 1451  HGBA1C 8.0*   CBG: Recent Labs  Lab 10/03/23 0029 10/03/23 0847 10/03/23 1130  GLUCAP 108* 107* 170*   Lipid Profile: No results for input(s): "CHOL", "HDL", "LDLCALC", "TRIG", "CHOLHDL", "LDLDIRECT" in the last 72 hours. Thyroid Function Tests: No results for input(s): "TSH", "T4TOTAL", "FREET4", "T3FREE", "THYROIDAB" in the last 72 hours. Anemia Panel: No results for input(s): "VITAMINB12", "FOLATE", "FERRITIN", "TIBC", "IRON", "RETICCTPCT" in the last 72 hours. Sepsis Labs: No results for input(s):  "PROCALCITON", "LATICACIDVEN" in the last 168 hours.  No results found for this or any previous visit (from the past 240 hours).       Radiology Studies: MR Cervical Spine Wo Contrast Result Date: 10/02/2023 CLINICAL DATA:  Neck trauma, ligament injury suspected. Fall from standing position. EXAM: MRI CERVICAL SPINE WITHOUT CONTRAST TECHNIQUE: Multiplanar, multisequence MR imaging of the cervical spine was performed. No intravenous contrast was administered. COMPARISON:  CT of the cervical spine 10/02/2023. FINDINGS: Alignment: Straightening of the normal cervical lordosis is again noted. No significant listhesis is present. Vertebrae: Marrow signal and vertebral body heights are normal. Cord: The ventral cord is compressed C5-6 and C6-7 secondary to disc disease and ossification of the posterior longitudinal ligament. Posterior Fossa, vertebral arteries, paraspinal tissues: Craniocervical junction is normal. Flow is present in the vertebral arteries bilaterally. Visualized intracranial contents are normal. Edematous changes are present along the posterior spinous ligament from C2-C5. Diffuse edematous changes are present within the posterior paraspinous musculature bilaterally throughout the cervical spine. Disc levels: C2-3: A rightward disc osteophyte complex partially effaces the ventral CSF. Mild foraminal narrowing is present bilaterally. C3-4:  A broad-based disc osteophyte complex is present. Partial effacement of ventral CSF is noted. Mild foraminal narrowing is present bilaterally. C4-5: Ossification of posterior longitudinal ligament partially effaces the ventral CSF. The foramina are patent bilaterally. C5-6: A left paramedian disc osteophyte complex and ossification of posterior longitudinal ligament compress the spinal cord to 5 mm. Moderate left and mild right foraminal stenosis is present. C6-7: A central disc protrusion and ossification of posterior longitudinal ligament compress the spinal  cord 5 mm. Moderate foraminal stenosis is present bilaterally. C7-T1: A broad-based disc osteophyte complex is asymmetric to the right. This effaces the ventral CSF. Moderate right and mild left foraminal stenosis is present. IMPRESSION: 1. Edematous changes along the posterior spinous ligament from C2-C5 compatible with acute ligamentous injury. 2. Diffuse edematous changes within the posterior paraspinous musculature bilaterally throughout the cervical spine compatible with muscle strain. 3. Multilevel spondylosis of the cervical spine as described. 4. 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 5. Moderate foraminal stenosis bilaterally at C6-7 and on the right at C7-T1. 6. Mild foraminal narrowing bilaterally at C2-3 and C3-4. These results were called by telephone at the time of interpretation on 10/02/2023 at 5:58 pm to provider [facility] , who verbally acknowledged these results. Electronically Signed   By: Marin Roberts M.D.   On: 10/02/2023 17:58   CT Thoracic Spine Wo Contrast Result Date: 10/02/2023 CLINICAL DATA:  Back trauma, no prior imaging (Age >= 16y) fall Fall from standing.  Back pain. EXAM: CT THORACIC SPINE WITHOUT CONTRAST TECHNIQUE: Multidetector CT images of the thoracic were obtained using the standard protocol without intravenous contrast. RADIATION DOSE REDUCTION: This exam was performed according to the departmental dose-optimization program which includes automated exposure control, adjustment of the mA and/or kV according to patient size and/or use of iterative reconstruction technique. COMPARISON:  None Available. FINDINGS: Alignment: Normal. Vertebrae: No acute fracture. Normal vertebral body heights. Posterior elements are intact. There is fusion of the spinous processes. Paraspinal and other soft tissues: No paraspinal hematoma. Retained mucus within the right mainstem bronchus  extending into all lobar bronchi. Mucoid impaction in the right lower lobe bronchi, with progression from December 2024 chest CT. Additional areas of nodular airspace disease within both lower lobes have improved. Disc levels: Diffuse anterior spurring. Scattered ossification of the discs. Posterior spurring at T12-L1 causes spinal canal stenosis. IMPRESSION: 1. No acute fracture or subluxation of the thoracic spine. 2. Retained mucus within the right mainstem bronchus extending into all lobar bronchi. Mucoid impaction in the right lower lobe bronchi, with progression from December 2024 chest CT. Additional areas of nodular airspace disease within both lower lobes have improved from prior exam. Recommend correlation for aspiration risk factors. Electronically Signed   By: Narda Rutherford M.D.   On: 10/02/2023 16:14   CT Cervical Spine Wo Contrast Result Date: 10/02/2023 CLINICAL DATA:  Neck trauma (Age >= 65y) Standing.  Neck pain. EXAM: CT CERVICAL SPINE WITHOUT CONTRAST TECHNIQUE: Multidetector CT imaging of the cervical spine was performed without intravenous contrast. Multiplanar CT image reconstructions were also generated. RADIATION DOSE REDUCTION: This exam was performed according to the departmental dose-optimization program which includes automated exposure control, adjustment of the mA and/or kV according to patient size and/or use of iterative reconstruction technique. COMPARISON:  None Available. FINDINGS: Alignment: Straightening of normal lordosis. No traumatic subluxation. Skull base and vertebrae: No acute fracture. Vertebral body heights are maintained. The  dens and skull base are intact. Soft tissues and spinal canal: No prevertebral fluid or swelling. No visible canal hematoma. Disc levels: There is diffuse ossification of the posterior longitudinal ligament extending from CT through C7. Additional a posterior disc osteophyte complex. Varying degrees of spinal canal stenosis. Upper chest: No  acute findings.  Emphysema. Other: Carotid calcifications. IMPRESSION: 1. No acute fracture or subluxation of the cervical spine. 2. Diffuse ossification of the posterior longitudinal ligament extending from C2 through C7. Additional posterior disc osteophyte complex. Varying degrees of spinal canal stenosis. Consider further assessment with MRI as clinically indicated. Electronically Signed   By: Narda Rutherford M.D.   On: 10/02/2023 16:08   CT Head Wo Contrast Result Date: 10/02/2023 CLINICAL DATA:  Head trauma, minor (Age >= 65y) Fall from standing. EXAM: CT HEAD WITHOUT CONTRAST TECHNIQUE: Contiguous axial images were obtained from the base of the skull through the vertex without intravenous contrast. RADIATION DOSE REDUCTION: This exam was performed according to the departmental dose-optimization program which includes automated exposure control, adjustment of the mA and/or kV according to patient size and/or use of iterative reconstruction technique. COMPARISON:  None Available. FINDINGS: Brain: No intracranial hemorrhage, mass effect, or midline shift. Age related atrophy per no hydrocephalus. The basilar cisterns are patent. Mild for age periventricular chronic small vessel ischemia. No evidence of territorial infarct or acute ischemia. No extra-axial or intracranial fluid collection. Vascular: Atherosclerosis of skullbase vasculature without hyperdense vessel or abnormal calcification. Skull: No fracture or focal lesion. Sinuses/Orbits: No acute finding.  Bilateral cataract resection. Other: No confluent scalp hematoma. IMPRESSION: 1. No acute intracranial abnormality. No skull fracture. 2. Age related atrophy and chronic small vessel ischemia. Electronically Signed   By: Narda Rutherford M.D.   On: 10/02/2023 16:03   DG Forearm Right Result Date: 10/02/2023 CLINICAL DATA:  Fall.  Pain. EXAM: RIGHT FOREARM - 2 VIEW COMPARISON:  None Available. FINDINGS: No evidence of acute fracture. Mild degenerative  change of the wrist and elbow, no dislocation. No erosions or focal bone abnormality. Vascular calcifications are seen. There is focal soft tissue prominence overlying the mid forearm. IMPRESSION: Focal soft tissue prominence overlying the mid forearm. No acute fracture or subluxation. Electronically Signed   By: Narda Rutherford M.D.   On: 10/02/2023 15:20   DG Pelvis Portable Result Date: 10/02/2023 CLINICAL DATA:  Fall. EXAM: PORTABLE PELVIS 1-2 VIEWS COMPARISON:  None Available. FINDINGS: The intertrochanteric and lateral right femur not included in the field of view. No evidence of pelvic fracture. Pubic rami are intact. No hip dislocation. Moderate bilateral hip osteoarthritis. Pubic symphysis and sacroiliac joints are congruent. Bilateral iliac stents. Right femoral stent is partially included. IMPRESSION: 1. No pelvic fracture. 2. Moderate bilateral hip osteoarthritis. Electronically Signed   By: Narda Rutherford M.D.   On: 10/02/2023 15:18   DG Chest Port 1 View Result Date: 10/02/2023 CLINICAL DATA:  Fall. EXAM: PORTABLE CHEST 1 VIEW COMPARISON:  Radiograph 05/10/2023.  CT 06/07/2023 FINDINGS: The cardiomediastinal contours are normal. The lungs are clear. Pulmonary vasculature is normal. No consolidation, pleural effusion, or pneumothorax. Reverse right shoulder arthroplasty. On limited assessment, no acute osseous finding. IMPRESSION: No active disease. Electronically Signed   By: Narda Rutherford M.D.   On: 10/02/2023 15:16   Scheduled Meds:  clopidogrel  75 mg Oral QPM   DULoxetine  20 mg Oral Daily   ferrous sulfate  325 mg Oral Q breakfast   insulin aspart  0-5 Units Subcutaneous QHS   insulin aspart  0-9 Units Subcutaneous TID WC   mirabegron ER  50 mg Oral Daily   pantoprazole  40 mg Oral BID   pregabalin  50 mg Oral BID   rosuvastatin  20 mg Oral QPM   Continuous Infusions:     LOS: 0 days   Time spent:  Azucena Fallen, DO [facility]  If 7PM-7AM,  please contact night-coverage [email or web address]  10/03/2023, 1:34 PM

## 2023-10-03 NOTE — Care Management Obs Status (Signed)
 MEDICARE OBSERVATION STATUS NOTIFICATION   Patient Details  Name: Jerimah Witucki MRN: [id number] Date of Birth: February 26, 1941   Medicare Observation Status Notification Given:  Yes    Ronny Bacon, RN 10/03/2023, 8:56 AM

## 2023-10-03 NOTE — Evaluation (Signed)
 Occupational Therapy Evaluation Patient Details Name: Christian Bailey MRN: [id number] DOB: September 16, 1940 Today's Date: 10/03/2023   History of Present Illness   Patient is an 83 yo male presenting to the ED status post fall on 10/02/23. Patient hit head in fall, with CT of head and spine clear. MRI finding edematous changes from C2-C5. 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  Moderate foraminal stenosis bilaterally at C6-7 and on the right at C7-T1.  Mild foraminal narrowing bilaterally at C2-3 and C3-4. PMH includes:  peripheral artery disease on Plavix and Crestor, RBBB, chronic HFpEF, CKD 4, carotid stenosis, hypertension, hyperlipidemia, type 2 diabetes, chronic anxiety/depression, iron deficiency anemia, GERD     Clinical Impressions Prior to this admission, patient living with his wife, and able to walk with a RW. Patient endorses that he has required significantly more assist with all aspects of care lately. Currently patient  is +2 mod assist supine <> sit, and +2 min/HHA sit to stand. Patient with poor sitting balance, and unable to transition to steps despite increased assist. During transition sit to sidelying, pt reporting an 'electric shock' down BLE. In bed, pt only tolerating HOB up to 10 degrees. Patient is currently mod to max A for ADL management. Given current level of function, OT is recommending rehab <  3 hours prior to return to home. If patient is able to return home, patient would require increased DME (see below and PT recommendation). OT will continue to follow acutely.     If plan is discharge home, recommend the following:   Two people to help with walking and/or transfers;A lot of help with bathing/dressing/bathroom;Assistance with cooking/housework;Assist for transportation;Help with stairs or ramp for entrance     Functional Status Assessment   Patient has had a recent decline  in their functional status and demonstrates the ability to make significant improvements in function in a reasonable and predictable amount of time.     Equipment Recommendations   Wheelchair (measurements OT);Wheelchair cushion (measurements OT)     Recommendations for Other Services         Precautions/Restrictions   Precautions Precautions: Fall Recall of Precautions/Restrictions: Intact Required Braces or Orthoses: Cervical Brace Cervical Brace: Soft collar (no wear schedule specified) Restrictions Weight Bearing Restrictions Per Provider Order: No     Mobility Bed Mobility Overal bed mobility: Needs Assistance Bed Mobility: Rolling, Sidelying to Sit, Sit to Sidelying Rolling: Mod assist Sidelying to sit: +2 for physical assistance, Mod assist, Used rails     Sit to sidelying: +2 for physical assistance, Mod assist General bed mobility comments: Pt only able to tolerate HOB at 10 degrees or lower, increased time, assist with trunk and BLE    Transfers Overall transfer level: Needs assistance Equipment used: 2 person hand held assist Transfers: Sit to/from Stand Sit to Stand: +2 physical assistance, Min assist, From elevated surface           General transfer comment: power up to stand with bilat 2-person assist      Balance Overall balance assessment: Needs assistance Sitting-balance support: Bilateral upper extremity supported, Feet supported Sitting balance-Leahy Scale: Poor Sitting balance - Comments: frequent LOB posteriorly sitting EOB Postural control: Posterior lean Standing balance support: Bilateral upper extremity supported Standing balance-Leahy Scale: Poor Standing balance comment: static stand 2-person assist  ADL either performed or assessed with clinical judgement   ADL Overall ADL's : Needs assistance/impaired Eating/Feeding: Set up;Sitting   Grooming: Set up;Sitting   Upper Body Bathing:  Minimal assistance;Sitting   Lower Body Bathing: Maximal assistance;Total assistance;Sitting/lateral leans;Sit to/from stand   Upper Body Dressing : Minimal assistance;Sitting   Lower Body Dressing: Maximal assistance;Total assistance;Sit to/from stand;Sitting/lateral leans   Toilet Transfer: Minimal assistance;+2 for physical assistance;+2 for safety/equipment;Stand-pivot;BSC/3in1 Toilet Transfer Details (indicate cue type and reason): simulated with sit<>stand Toileting- Clothing Manipulation and Hygiene: Maximal assistance;Total assistance;Sitting/lateral lean;Sit to/from stand Toileting - Clothing Manipulation Details (indicate cue type and reason): unable to complete in standing     Functional mobility during ADLs: Moderate assistance;+2 for physical assistance;+2 for safety/equipment;Cueing for safety;Cueing for sequencing General ADL Comments: Prior to this admission, patient living with his wife, and able to walk with a RW. Patient endorses that he has required significantly more assist with all aspects of care lately. Currently patient  is +2 mod assist supine <> sit, and +2 min/HHA sit to stand. Patient with poor sitting balance, and unable to transition to steps despite increased assist. During transition sit to sidelying, pt reporting an 'electric shock' down BLE. In bed, pt only tolerating HOB up to 10 degrees. Patient is currently mod to max A for ADL management. Given current level of function, OT is recommending rehab <  3 hours prior to return to home. If patient is able to return home, patient would require increased DME (see below and PT recommendation). OT will continue to follow acutely.     Vision Baseline Vision/History: 1 Wears glasses Ability to See in Adequate Light: 0 Adequate Patient Visual Report: No change from baseline Vision Assessment?: No apparent visual deficits     Perception Perception: Not tested       Praxis Praxis: Not tested       Pertinent  Vitals/Pain Pain Assessment Pain Assessment: Faces Faces Pain Scale: Hurts worst Pain Location: neck/back Pain Descriptors / Indicators: Grimacing, Guarding, Moaning, Pins and needles, Radiating Pain Intervention(s): Limited activity within patient's tolerance, Monitored during session, Repositioned, Patient requesting pain meds-RN notified     Extremity/Trunk Assessment Upper Extremity Assessment Upper Extremity Assessment: Generalized weakness;Right hand dominant   Lower Extremity Assessment Lower Extremity Assessment: Defer to PT evaluation   Cervical / Trunk Assessment Cervical / Trunk Assessment: Other exceptions Cervical / Trunk Exceptions: cervical ligamentous injury   Communication Communication Communication: No apparent difficulties   Cognition Arousal: Alert Behavior During Therapy: WFL for tasks assessed/performed Cognition: No apparent impairments                               Following commands: Impaired Following commands impaired: Follows one step commands inconsistently, Follows one step commands with increased time (distracted by pain)     Cueing  General Comments   Cueing Techniques: Verbal cues;Tactile cues  HR 111, multiple skin tears on BUEs   Exercises     Shoulder Instructions      Home Living Family/patient expects to be discharged to:: Private residence Living Arrangements: Spouse/significant other Available Help at Discharge: Family;Available 24 hours/day Type of Home: House Home Access: Stairs to enter [employer] of Steps: 3 Entrance Stairs-Rails: Left Home Layout: One level     Bathroom Shower/Tub: Producer, television/film/video: Handicapped height     Home Equipment: Agricultural consultant (2 wheels);Rollator (4 wheels);Shower seat - built Designer, fashion/clothing: Ship broker  Prior Functioning/Environment Prior Level of Function : Needs assist;History of Falls (last six  months)             Mobility Comments: pt using RW in home, rollator in community. sideways on stairs for BUE support on rail. reports sit-stand can take 5-6 tries ADLs Comments: wife recently has helped with sponge bath, typically pt gets in shower. Pt recently fell putting on underwear while standing. Pt reports use of sock aid to donn socks    OT Problem List: Decreased strength;Decreased range of motion;Decreased activity tolerance;Impaired balance (sitting and/or standing);Decreased coordination;Decreased safety awareness;Decreased knowledge of use of DME or AE;Pain   OT Treatment/Interventions: Self-care/ADL training;Therapeutic exercise;Energy conservation;DME and/or AE instruction;Therapeutic activities;Patient/family education;Balance training;Manual therapy      OT Goals(Current goals can be found in the care plan section)   Acute Rehab OT Goals Patient Stated Goal: to go home OT Goal Formulation: With patient Time For Goal Achievement: 10/17/23 Potential to Achieve Goals: Fair ADL Goals Pt Will Perform Lower Body Bathing: with min assist;sitting/lateral leans;sit to/from stand Pt Will Perform Lower Body Dressing: with min assist;sitting/lateral leans;sit to/from stand Pt Will Transfer to Toilet: with min assist;stand pivot transfer;bedside commode Pt Will Perform Toileting - Clothing Manipulation and hygiene: with min assist;sitting/lateral leans;sit to/from stand Additional ADL Goal #1: Patient will be able to transition to EOB with min A as a precursor to OOB activities.   OT Frequency:  Min 2X/week    Co-evaluation   Reason for Co-Treatment: For patient/therapist safety;To address functional/ADL transfers PT goals addressed during session: Mobility/safety with mobility;Balance OT goals addressed during session: ADL's and self-care;Strengthening/ROM      AM-PAC OT "6 Clicks" Daily Activity     Outcome Measure Help from another person eating meals?: A  Little Help from another person taking care of personal grooming?: A Little Help from another person toileting, which includes using toliet, bedpan, or urinal?: Total Help from another person bathing (including washing, rinsing, drying)?: A Lot Help from another person to put on and taking off regular upper body clothing?: A Little Help from another person to put on and taking off regular lower body clothing?: Total 6 Click Score: 13   End of Session Equipment Utilized During Treatment: Gait belt;Cervical collar Nurse Communication: Mobility status;Patient requests pain meds  Activity Tolerance: Patient limited by fatigue;Patient limited by pain Patient left: in bed;with call bell/phone within reach;with bed alarm set  OT Visit Diagnosis: Unsteadiness on feet (R26.81);Other abnormalities of gait and mobility (R26.89);Repeated falls (R29.6);Muscle weakness (generalized) (M62.81);History of falling (Z91.81);Pain                Time: 8119-1478 OT Time Calculation (min): 21 min Charges:  OT General Charges $OT Visit: 1 Visit OT Evaluation $OT Eval Moderate Complexity: 1 Mod  Pollyann Glen E. Zakyla Tonche, OTR/L Acute Rehabilitation Services [phone]   Cherlyn Cushing 10/03/2023, 1:50 PM

## 2023-10-04 DIAGNOSIS — N184 Chronic kidney disease, stage 4 (severe): Secondary | ICD-10-CM | POA: Diagnosis not present

## 2023-10-04 DIAGNOSIS — R52 Pain, unspecified: Secondary | ICD-10-CM | POA: Diagnosis not present

## 2023-10-04 DIAGNOSIS — R262 Difficulty in walking, not elsewhere classified: Secondary | ICD-10-CM | POA: Diagnosis not present

## 2023-10-04 DIAGNOSIS — I5032 Chronic diastolic (congestive) heart failure: Secondary | ICD-10-CM | POA: Diagnosis not present

## 2023-10-04 LAB — GLUCOSE, CAPILLARY
Glucose-Capillary: 108 mg/dL — ABNORMAL HIGH (ref 70–99)
Glucose-Capillary: 145 mg/dL — ABNORMAL HIGH (ref 70–99)
Glucose-Capillary: 114 mg/dL — ABNORMAL HIGH (ref 70–99)
Glucose-Capillary: 159 mg/dL — ABNORMAL HIGH (ref 70–99)

## 2023-10-04 MED ORDER — ENSURE ENLIVE PO LIQD
237.0000 mL | Freq: Two times a day (BID) | ORAL | Status: DC
  Administered 2023-10-04 – 2023-10-18 (×19): 237 mL via ORAL

## 2023-10-04 NOTE — Plan of Care (Signed)
   Problem: Nutritional: Goal: Maintenance of adequate nutrition will improve Outcome: Progressing

## 2023-10-04 NOTE — Progress Notes (Signed)
 PROGRESS NOTE    Christian Bailey  UJW:[id number] DOB: September 30, 1940 DOA: 10/02/2023 PCP: Chilton Greathouse, MD   Brief Narrative:  Christian Bailey is a 83 y.o. male with medical history significant for peripheral artery disease on Plavix and Crestor, RBBB, chronic HFpEF, CKD 4, ambulatory dysfunction (uses a walker at baseline), carotid stenosis, hypertension, hyperlipidemia, type 2 diabetes, chronic anxiety/depression, iron deficiency anemia, GERD, presents to the ER after a fall.   Patient has what appears to be acute on chronic ambulatory dysfunction with fall, and intake patient's imaging was unremarkable for any acute fracture, imaging did indicate posterior spinous ligament injury from C2-C5 as well as diffuse muscle strain in the area.  No other acute findings.  Neurosurgery consulted recommending soft collar, hospitalist called for admission due to ongoing ambulatory dysfunction.  Patient improving but continues to have profound pain and difficulty walking - PT recommending evaluation for inpatient rehab. Patient and wife agreeable.  Assessment & Plan:   Principal Problem:   Fall Active Problems:   Intractable back pain   Acute on chronic ambulatory dysfunction Mechanical fall with trauma -Imaging as above negative for fracture but notable for spinous ligament injury/tear, neurosurgery recommending soft collar with follow-up in 4 to 6 weeks -Patient's ambulatory status continues to be limited by pain(see below) -PT OT to follow for recommendations, potentially discharge home with therapy versus SNF pending needs  Acute intractable pain Peripheral arterial disease -Increase patient's regimen including tramadol, low-dose oxycodone, Flexeril, pregabalin -Patient does have some aspect of chronic pain somewhat well-controlled at home on duloxetine and over-the-counter NSAIDs -Patient complaining of "shooting pain" versus muscle spasms in his lower extremities, continue Flexeril and pregabalin  as above  Leukocytosis, reactive   CKD 4 baseline -patient does not meet criteria for AKI given baseline - continue to increase p.o. intake as appropriate, DC fluids to avoid volume overload Anxiety/depression-continue Cymbalta Hypertension-currently holding metoprolol, lisinopril given above -resume as appropriate Diabetes type 2 uncontrolled with hyperglycemia -A1c 8.0, continue sliding scale insulin hypoglycemic protocol HFpEF, without acute exacerbation -not on diuretics, hold metoprolol in the setting of hypotension  DVT prophylaxis: SCDs Start: 10/02/23 2244 Code Status:   Code Status: Full Code Family Communication: Wife updated at bedside  Status is: Inpatient  Dispo: The patient is from: Home              Anticipated d/c is to: To be determined              Anticipated d/c date is: 24 to 48 hours              Patient currently not medically stable for discharge in the setting of intractable pain and ambulatory dysfunction  Consultants:  Neurosurgery  Procedures:  None  Antimicrobials:  None indicated  Subjective: No acute issues or events overnight, pain continues but is moderately improved. No pain at rest but more noticeable with movement. PT in the room as we discussed ambulation strategies.  Objective: Vitals:   10/03/23 2002 10/04/23 0443 10/04/23 0446 10/04/23 0728  BP: (!) 117/93 (!) 109/50  (!) 124/56  Pulse: (!) 107 (!) 105    Resp:    17  Temp: 97.6 F (36.4 C) 97.9 F (36.6 C)  (!) 97.5 F (36.4 C)  TempSrc: Oral Oral  Oral  SpO2: 96% 91% 94% 91%  Weight:      Height:        Intake/Output Summary (Last 24 hours) at 10/04/2023 1251 Last data filed at 10/04/2023 0900 Gross per  24 hour  Intake 1371.33 ml  Output 700 ml  Net 671.33 ml   Filed Weights   10/02/23 1509  Weight: 65 kg    Examination:  General: No acute distress resting comfortably in bed Neck: Soft collar noted - patient able to nod/shake head mildly with no pain Lungs:  Clear  to auscultate bilaterally without rhonchi, wheeze, or rales. Heart: Tachycardic without murmur rubs or gallops Abdomen:  Soft, nontender, nondistended.  Without guarding or rebound. Extremities: Without cyanosis, clubbing, edema. RUE ROM limited by pain Skin:  Warm and dry, no erythema.   Data Reviewed: I have personally reviewed following labs and imaging studies  CBC: Recent Labs  Lab 10/02/23 1451 10/02/23 1522 10/03/23 0558  WBC 12.0*  --  10.2  NEUTROABS 9.4*  --   --   HGB 10.6* 10.5* 10.6*  HCT 33.3* 31.0* 31.4*  MCV 90.7  --  87.2  PLT 306  --  276   Basic Metabolic Panel: Recent Labs  Lab 10/02/23 1451 10/02/23 1522 10/03/23 0558  NA 140 140 142  K 4.5 4.5 4.5  CL 105 109 108  CO2 21*  --  24  GLUCOSE 193* 191* 121*  BUN 43* 42* 38*  CREATININE 2.60* 2.80* 2.23*  CALCIUM 9.3  --  9.2  MG  --   --  1.9  PHOS  --   --  3.8   GFR: Estimated Creatinine Clearance: 22.2 mL/min (A) (by C-G formula based on SCr of 2.23 mg/dL (H)).  Liver Function Tests: Recent Labs  Lab 10/02/23 1451  AST 23  ALT 19  ALKPHOS 71  BILITOT 0.5  PROT 7.3  ALBUMIN 3.7    HbA1C: Recent Labs    10/02/23 1451  HGBA1C 8.0*   CBG: Recent Labs  Lab 10/03/23 1130 10/03/23 1618 10/03/23 2108 10/04/23 0623 10/04/23 1128  GLUCAP 170* 142* 120* 108* 145*   No results found for this or any previous visit (from the past 240 hours).       Radiology Studies: MR Cervical Spine Wo Contrast Result Date: 10/02/2023 CLINICAL DATA:  Neck trauma, ligament injury suspected. Fall from standing position. EXAM: MRI CERVICAL SPINE WITHOUT CONTRAST TECHNIQUE: Multiplanar, multisequence MR imaging of the cervical spine was performed. No intravenous contrast was administered. COMPARISON:  CT of the cervical spine 10/02/2023. FINDINGS: Alignment: Straightening of the normal cervical lordosis is again noted. No significant listhesis is present. Vertebrae: Marrow signal and vertebral body  heights are normal. Cord: The ventral cord is compressed C5-6 and C6-7 secondary to disc disease and ossification of the posterior longitudinal ligament. Posterior Fossa, vertebral arteries, paraspinal tissues: Craniocervical junction is normal. Flow is present in the vertebral arteries bilaterally. Visualized intracranial contents are normal. Edematous changes are present along the posterior spinous ligament from C2-C5. Diffuse edematous changes are present within the posterior paraspinous musculature bilaterally throughout the cervical spine. Disc levels: C2-3: A rightward disc osteophyte complex partially effaces the ventral CSF. Mild foraminal narrowing is present bilaterally. C3-4: A broad-based disc osteophyte complex is present. Partial effacement of ventral CSF is noted. Mild foraminal narrowing is present bilaterally. C4-5: Ossification of posterior longitudinal ligament partially effaces the ventral CSF. The foramina are patent bilaterally. C5-6: A left paramedian disc osteophyte complex and ossification of posterior longitudinal ligament compress the spinal cord to 5 mm. Moderate left and mild right foraminal stenosis is present. C6-7: A central disc protrusion and ossification of posterior longitudinal ligament compress the spinal cord 5 mm. Moderate foraminal stenosis  is present bilaterally. C7-T1: A broad-based disc osteophyte complex is asymmetric to the right. This effaces the ventral CSF. Moderate right and mild left foraminal stenosis is present. IMPRESSION: 1. Edematous changes along the posterior spinous ligament from C2-C5 compatible with acute ligamentous injury. 2. Diffuse edematous changes within the posterior paraspinous musculature bilaterally throughout the cervical spine compatible with muscle strain. 3. Multilevel spondylosis of the cervical spine as described. 4. Compression of the spinal cord to 5 mm at C5-6 and C6-7 secondary to disc disease and ossification of posterior longitudinal  ligament. 5. Moderate foraminal stenosis bilaterally at C6-7 and on the right at C7-T1. 6. Mild foraminal narrowing bilaterally at C2-3 and C3-4. These results were called by telephone at the time of interpretation on 10/02/2023 at 5:58 pm to provider [facility] , who verbally acknowledged these results. Electronically Signed   By: Marin Roberts M.D.   On: 10/02/2023 17:58   CT Thoracic Spine Wo Contrast Result Date: 10/02/2023 CLINICAL DATA:  Back trauma, no prior imaging (Age >= 16y) fall Fall from standing.  Back pain. EXAM: CT THORACIC SPINE WITHOUT CONTRAST TECHNIQUE: Multidetector CT images of the thoracic were obtained using the standard protocol without intravenous contrast. RADIATION DOSE REDUCTION: This exam was performed according to the departmental dose-optimization program which includes automated exposure control, adjustment of the mA and/or kV according to patient size and/or use of iterative reconstruction technique. COMPARISON:  None Available. FINDINGS: Alignment: Normal. Vertebrae: No acute fracture. Normal vertebral body heights. Posterior elements are intact. There is fusion of the spinous processes. Paraspinal and other soft tissues: No paraspinal hematoma. Retained mucus within the right mainstem bronchus extending into all lobar bronchi. Mucoid impaction in the right lower lobe bronchi, with progression from December 2024 chest CT. Additional areas of nodular airspace disease within both lower lobes have improved. Disc levels: Diffuse anterior spurring. Scattered ossification of the discs. Posterior spurring at T12-L1 causes spinal canal stenosis. IMPRESSION: 1. No acute fracture or subluxation of the thoracic spine. 2. Retained mucus within the right mainstem bronchus extending into all lobar bronchi. Mucoid impaction in the right lower lobe bronchi, with progression from December 2024 chest CT. Additional areas of nodular airspace disease within both lower lobes have improved  from prior exam. Recommend correlation for aspiration risk factors. Electronically Signed   By: Narda Rutherford M.D.   On: 10/02/2023 16:14   CT Cervical Spine Wo Contrast Result Date: 10/02/2023 CLINICAL DATA:  Neck trauma (Age >= 65y) Standing.  Neck pain. EXAM: CT CERVICAL SPINE WITHOUT CONTRAST TECHNIQUE: Multidetector CT imaging of the cervical spine was performed without intravenous contrast. Multiplanar CT image reconstructions were also generated. RADIATION DOSE REDUCTION: This exam was performed according to the departmental dose-optimization program which includes automated exposure control, adjustment of the mA and/or kV according to patient size and/or use of iterative reconstruction technique. COMPARISON:  None Available. FINDINGS: Alignment: Straightening of normal lordosis. No traumatic subluxation. Skull base and vertebrae: No acute fracture. Vertebral body heights are maintained. The dens and skull base are intact. Soft tissues and spinal canal: No prevertebral fluid or swelling. No visible canal hematoma. Disc levels: There is diffuse ossification of the posterior longitudinal ligament extending from CT through C7. Additional a posterior disc osteophyte complex. Varying degrees of spinal canal stenosis. Upper chest: No acute findings.  Emphysema. Other: Carotid calcifications. IMPRESSION: 1. No acute fracture or subluxation of the cervical spine. 2. Diffuse ossification of the posterior longitudinal ligament extending from C2 through C7. Additional posterior disc  osteophyte complex. Varying degrees of spinal canal stenosis. Consider further assessment with MRI as clinically indicated. Electronically Signed   By: Narda Rutherford M.D.   On: 10/02/2023 16:08   CT Head Wo Contrast Result Date: 10/02/2023 CLINICAL DATA:  Head trauma, minor (Age >= 65y) Fall from standing. EXAM: CT HEAD WITHOUT CONTRAST TECHNIQUE: Contiguous axial images were obtained from the base of the skull through the vertex  without intravenous contrast. RADIATION DOSE REDUCTION: This exam was performed according to the departmental dose-optimization program which includes automated exposure control, adjustment of the mA and/or kV according to patient size and/or use of iterative reconstruction technique. COMPARISON:  None Available. FINDINGS: Brain: No intracranial hemorrhage, mass effect, or midline shift. Age related atrophy per no hydrocephalus. The basilar cisterns are patent. Mild for age periventricular chronic small vessel ischemia. No evidence of territorial infarct or acute ischemia. No extra-axial or intracranial fluid collection. Vascular: Atherosclerosis of skullbase vasculature without hyperdense vessel or abnormal calcification. Skull: No fracture or focal lesion. Sinuses/Orbits: No acute finding.  Bilateral cataract resection. Other: No confluent scalp hematoma. IMPRESSION: 1. No acute intracranial abnormality. No skull fracture. 2. Age related atrophy and chronic small vessel ischemia. Electronically Signed   By: Narda Rutherford M.D.   On: 10/02/2023 16:03   DG Forearm Right Result Date: 10/02/2023 CLINICAL DATA:  Fall.  Pain. EXAM: RIGHT FOREARM - 2 VIEW COMPARISON:  None Available. FINDINGS: No evidence of acute fracture. Mild degenerative change of the wrist and elbow, no dislocation. No erosions or focal bone abnormality. Vascular calcifications are seen. There is focal soft tissue prominence overlying the mid forearm. IMPRESSION: Focal soft tissue prominence overlying the mid forearm. No acute fracture or subluxation. Electronically Signed   By: Narda Rutherford M.D.   On: 10/02/2023 15:20   DG Pelvis Portable Result Date: 10/02/2023 CLINICAL DATA:  Fall. EXAM: PORTABLE PELVIS 1-2 VIEWS COMPARISON:  None Available. FINDINGS: The intertrochanteric and lateral right femur not included in the field of view. No evidence of pelvic fracture. Pubic rami are intact. No hip dislocation. Moderate bilateral hip  osteoarthritis. Pubic symphysis and sacroiliac joints are congruent. Bilateral iliac stents. Right femoral stent is partially included. IMPRESSION: 1. No pelvic fracture. 2. Moderate bilateral hip osteoarthritis. Electronically Signed   By: Narda Rutherford M.D.   On: 10/02/2023 15:18   DG Chest Port 1 View Result Date: 10/02/2023 CLINICAL DATA:  Fall. EXAM: PORTABLE CHEST 1 VIEW COMPARISON:  Radiograph 05/10/2023.  CT 06/07/2023 FINDINGS: The cardiomediastinal contours are normal. The lungs are clear. Pulmonary vasculature is normal. No consolidation, pleural effusion, or pneumothorax. Reverse right shoulder arthroplasty. On limited assessment, no acute osseous finding. IMPRESSION: No active disease. Electronically Signed   By: Narda Rutherford M.D.   On: 10/02/2023 15:16   Scheduled Meds:  clopidogrel  75 mg Oral QPM   DULoxetine  20 mg Oral Daily   feeding supplement  237 mL Oral BID BM   ferrous sulfate  325 mg Oral Q breakfast   insulin aspart  0-5 Units Subcutaneous QHS   insulin aspart  0-9 Units Subcutaneous TID WC   mirabegron ER  50 mg Oral Daily   pantoprazole  40 mg Oral BID   pregabalin  50 mg Oral BID   rosuvastatin  20 mg Oral QPM   Continuous Infusions:     LOS: 1 day   Time spent:  Azucena Fallen, DO [facility]  If 7PM-7AM, please contact night-coverage [email or web address]  10/04/2023, 12:51 PM

## 2023-10-04 NOTE — TOC Initial Note (Signed)
 Transition of Care [facility]) - Initial/Assessment Note    Patient Details  Name: Christian Bailey MRN: [id number] Date of Birth: Dec 21, 1940  [facility] CM/SW Contact:    Lorri Frederick, LCSW Phone Number: 10/04/2023, 2:15 PM  Clinical Narrative:   CSW met with pt and wife [name] regarding PT recommendation for SNF.  Permission given to speak with wife and son Reuel Boom.  Wife reports PT just completed session and they are now considering CIR, which would be their preference.  If CIR not option, they would consider SNF and would be interested in [facility].  Pt from home with wife, no current services.                 CSW spoke with PT/Holly, who confirmed she will be recommending CIR after today's session.    Expected Discharge Plan: IP Rehab Facility Barriers to Discharge: Continued Medical Work up   Patient Goals and CMS Choice Patient states their goals for this hospitalization and ongoing recovery are:: walking          Expected Discharge Plan and Services In-house Referral: Clinical Social Work   Post Acute Care Choice: IP Rehab Living arrangements for the past 2 months: Single Family Home                                      Prior Living Arrangements/Services Living arrangements for the past 2 months: Single Family Home Lives with:: Spouse Patient language and need for interpreter reviewed:: Yes Do you feel safe going back to the place where you live?: Yes      Need for Family Participation in Patient Care: Yes (Comment) Care giver support system in place?: Yes (comment) Current home services: Other (comment) (none) Criminal Activity/Legal Involvement Pertinent to Current Situation/Hospitalization: No - Comment as needed  Activities of Daily Living   ADL Screening (condition at time of admission) Independently performs ADLs?: No (wife helps him, legally blind) Does the patient have a NEW difficulty with bathing/dressing/toileting/self-feeding  that is expected to last >3 days?: No Does the patient have a NEW difficulty with getting in/out of bed, walking, or climbing stairs that is expected to last >3 days?: No Does the patient have a NEW difficulty with communication that is expected to last >3 days?: No Is the patient deaf or have difficulty hearing?: Yes Does the patient have difficulty seeing, even when wearing glasses/contacts?: Yes (legqally blind) Does the patient have difficulty concentrating, remembering, or making decisions?: No  Permission Sought/Granted Permission sought to share information with : Family Supports Permission granted to share information with : Yes, Verbal Permission Granted  Share Information with NAME: wife Cammie Mcgee, son Reuel Boom           Emotional Assessment Appearance:: Appears stated age Attitude/Demeanor/Rapport: Engaged Affect (typically observed): Pleasant, Appropriate Orientation: :  (not listed, appears oriented)      Admission diagnosis:  Neck pain [M54.2] Unsteady gait [R26.81] Fall [W19.XXXA] Fall, initial encounter [id number]] Intractable back pain [M54.9] Patient Active Problem List   Diagnosis Date Noted   Intractable back pain 10/03/2023   Fall 10/02/2023   Acute renal failure superimposed on stage 3b chronic kidney disease, unspecified acute renal failure type (HCC) 05/07/2023   Leukocytosis 05/07/2023   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) 03/25/2020   Exudative age-related macular degeneration of right eye with inactive choroidal neovascularization (HCC) 03/25/2020  Advanced nonexudative age-related macular degeneration of right eye with subfoveal involvement 03/25/2020   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 involvement 03/25/2020   Bacteremia due to Gram-negative bacteria 01/19/2019   CKD (chronic kidney disease) stage 3, GFR 30-59 ml/min (HCC) 01/19/2019    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) 07/06/2018   S/P shoulder replacement, right 12/07/2016   PVD (peripheral vascular disease) (HCC) 09/18/2016   Sinus tachycardia 05/15/2014   Carotid artery disease (HCC) 04/26/2014   PAD (peripheral artery disease) (HCC) 06/12/2013   RBBB (right bundle branch block with left anterior fascicular block) 06/12/2013   Claudication (HCC) 12/14/2012   Essential hypertension 12/14/2012   Hyperlipidemia 12/14/2012   Type 2 diabetes mellitus (HCC) 12/14/2012   History of colonic polyps 01/25/2012   DM 08/11/2010   Acute duodenal ulcer with hemorrhage 08/11/2010   PCP:  Chilton Greathouse, MD Pharmacy:   [facility] DELIVERY - [place], [place] - [address] [place] [address] Phone: [phone] Fax: [phone]  [id number] [place], [place] - [address] [place] [address] Phone: [phone] Fax: [phone]     Social Drivers of Health (SDOH) Social History: SDOH Screenings   Food Insecurity: No Food Insecurity (10/03/2023)  Housing: Low Risk  (10/03/2023)  Transportation Needs: No Transportation Needs (10/03/2023)  Utilities: Not At Risk (10/03/2023)  Social Connections: Moderately Isolated (10/03/2023)  Tobacco Use: Medium Risk (10/02/2023)   SDOH Interventions:     Readmission Risk Interventions     No data to display

## 2023-10-04 NOTE — Discharge Summary (Incomplete)
 Physician Discharge Summary  Christian Bailey JYN:[id number] DOB: 17-Feb-1941 DOA: 10/02/2023  PCP: Chilton Greathouse, MD  Admit date: 10/02/2023 Discharge date: 10/04/2023  Admitted From: *** Disposition:  ***  Recommendations for Outpatient Follow-up:  Follow up with PCP in 1-2 weeks Please obtain BMP/CBC in one week Please follow up on the following pending results:  Home Health:***  Equipment/Devices:***  Discharge Condition:***  CODE STATUS:***  Diet recommendation:    Brief/Interim Summary: ***  Discharge Diagnoses:  Principal Problem:   Fall Active Problems:   Intractable back pain    Discharge Instructions   Allergies as of 10/04/2023       Reactions   Asa [aspirin] Other (See Comments)   GI bleeding   Gadolinium Derivatives Itching, Swelling, Other (See Comments)   Pt complained of face flushing/hottness and throat tightness/scratchiness immediately after the injections.  Within 4 minutes, all symptoms were gone and the study was completed.  No further complications or signs of allergy were exhibited after completion of study.    Iodinated Contrast Media Other (See Comments)   Pt does not recall reaction   Pneumococcal 13-val Conj Vacc    Other Reaction(s): achiness all over, dizziness, nausea, weakness   Scopolamine    Other Reaction(s): Delerium   Oxycodone-acetaminophen Other (See Comments)   Dizziness and feeling of being uncomfortable      Med Rec must be completed prior to using this [facility]***       Follow-up Information     Christian Bailey, Christian Draft, MD.   Specialty: Internal Medicine Contact information: [address] [place] [address] [phone]         Christian Bogus, MD.   Specialty: Neurosurgery Contact information: [address] [place] [address] [phone]                Allergies  Allergen Reactions   Christian Bailey [Aspirin] Other (See Comments)    GI bleeding   Gadolinium Derivatives Itching,  Swelling and Other (See Comments)    Pt complained of face flushing/hottness and throat tightness/scratchiness immediately after the injections.  Within 4 minutes, all symptoms were gone and the study was completed.  No further complications or signs of allergy were exhibited after completion of study.    Iodinated Contrast Media Other (See Comments)    Pt does not recall reaction   Pneumococcal 13-Val Conj Vacc     Other Reaction(s): achiness all over, dizziness, nausea, weakness   Scopolamine     Other Reaction(s): Delerium   Oxycodone-Acetaminophen Other (See Comments)    Dizziness and feeling of being uncomfortable     Consultations: ***Specify Physician/Group   Procedures/Studies: MR Cervical Spine Wo Contrast Result Date: 10/02/2023 CLINICAL DATA:  Neck trauma, ligament injury suspected. Fall from standing position. EXAM: MRI CERVICAL SPINE WITHOUT CONTRAST TECHNIQUE: Multiplanar, multisequence MR imaging of the cervical spine was performed. No intravenous contrast was administered. COMPARISON:  CT of the cervical spine 10/02/2023. FINDINGS: Alignment: Straightening of the normal cervical lordosis is again noted. No significant listhesis is present. Vertebrae: Marrow signal and vertebral body heights are normal. Cord: The ventral cord is compressed C5-6 and C6-7 secondary to disc disease and ossification of the posterior longitudinal ligament. Posterior Fossa, vertebral arteries, paraspinal tissues: Craniocervical junction is normal. Flow is present in the vertebral arteries bilaterally. Visualized intracranial contents are normal. Edematous changes are present along the posterior spinous ligament from C2-C5. Diffuse edematous changes are present within the posterior paraspinous musculature bilaterally throughout the cervical spine. Disc levels:  C2-3: A rightward disc osteophyte complex partially effaces the ventral CSF. Mild foraminal narrowing is present bilaterally. C3-4: A broad-based disc  osteophyte complex is present. Partial effacement of ventral CSF is noted. Mild foraminal narrowing is present bilaterally. C4-5: Ossification of posterior longitudinal ligament partially effaces the ventral CSF. The foramina are patent bilaterally. C5-6: A left paramedian disc osteophyte complex and ossification of posterior longitudinal ligament compress the spinal cord to 5 mm. Moderate left and mild right foraminal stenosis is present. C6-7: A central disc protrusion and ossification of posterior longitudinal ligament compress the spinal cord 5 mm. Moderate foraminal stenosis is present bilaterally. C7-T1: A broad-based disc osteophyte complex is asymmetric to the right. This effaces the ventral CSF. Moderate right and mild left foraminal stenosis is present. IMPRESSION: 1. Edematous changes along the posterior spinous ligament from C2-C5 compatible with acute ligamentous injury. 2. Diffuse edematous changes within the posterior paraspinous musculature bilaterally throughout the cervical spine compatible with muscle strain. 3. Multilevel spondylosis of the cervical spine as described. 4. 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 5. Moderate foraminal stenosis bilaterally at C6-7 and on the right at C7-T1. 6. Mild foraminal narrowing bilaterally at C2-3 and C3-4. These results were called by telephone at the time of interpretation on 10/02/2023 at 5:58 pm to provider Christian Bailey , who verbally acknowledged these results. Electronically Signed   By: Marin Roberts M.D.   On: 10/02/2023 17:58   CT Thoracic Spine Wo Contrast Result Date: 10/02/2023 CLINICAL DATA:  Back trauma, no prior imaging (Age >= 16y) fall Fall from standing.  Back pain. EXAM: CT THORACIC SPINE WITHOUT CONTRAST TECHNIQUE: Multidetector CT images of the thoracic were obtained using the standard protocol without intravenous contrast. RADIATION  DOSE REDUCTION: This exam was performed according to the departmental dose-optimization program which includes automated exposure control, adjustment of the mA and/or kV according to patient size and/or use of iterative reconstruction technique. COMPARISON:  None Available. FINDINGS: Alignment: Normal. Vertebrae: No acute fracture. Normal vertebral body heights. Posterior elements are intact. There is fusion of the spinous processes. Paraspinal and other soft tissues: No paraspinal hematoma. Retained mucus within the right mainstem bronchus extending into all lobar bronchi. Mucoid impaction in the right lower lobe bronchi, with progression from December 2024 chest CT. Additional areas of nodular airspace disease within both lower lobes have improved. Disc levels: Diffuse anterior spurring. Scattered ossification of the discs. Posterior spurring at T12-L1 causes spinal canal stenosis. IMPRESSION: 1. No acute fracture or subluxation of the thoracic spine. 2. Retained mucus within the right mainstem bronchus extending into all lobar bronchi. Mucoid impaction in the right lower lobe bronchi, with progression from December 2024 chest CT. Additional areas of nodular airspace disease within both lower lobes have improved from prior exam. Recommend correlation for aspiration risk factors. Electronically Signed   By: Narda Rutherford M.D.   On: 10/02/2023 16:14   CT Cervical Spine Wo Contrast Result Date: 10/02/2023 CLINICAL DATA:  Neck trauma (Age >= 65y) Standing.  Neck pain. EXAM: CT CERVICAL SPINE WITHOUT CONTRAST TECHNIQUE: Multidetector CT imaging of the cervical spine was performed without intravenous contrast. Multiplanar CT image reconstructions were also generated. RADIATION DOSE REDUCTION: This exam was performed according to the departmental dose-optimization program which includes automated exposure control, adjustment of the mA and/or kV according to patient size and/or use of iterative reconstruction  technique. COMPARISON:  None Available. FINDINGS: Alignment: Straightening  of normal lordosis. No traumatic subluxation. Skull base and vertebrae: No acute fracture. Vertebral body heights are maintained. The dens and skull base are intact. Soft tissues and spinal canal: No prevertebral fluid or swelling. No visible canal hematoma. Disc levels: There is diffuse ossification of the posterior longitudinal ligament extending from CT through C7. Additional a posterior disc osteophyte complex. Varying degrees of spinal canal stenosis. Upper chest: No acute findings.  Emphysema. Other: Carotid calcifications. IMPRESSION: 1. No acute fracture or subluxation of the cervical spine. 2. Diffuse ossification of the posterior longitudinal ligament extending from C2 through C7. Additional posterior disc osteophyte complex. Varying degrees of spinal canal stenosis. Consider further assessment with MRI as clinically indicated. Electronically Signed   By: Narda Rutherford M.D.   On: 10/02/2023 16:08   CT Head Wo Contrast Result Date: 10/02/2023 CLINICAL DATA:  Head trauma, minor (Age >= 65y) Fall from standing. EXAM: CT HEAD WITHOUT CONTRAST TECHNIQUE: Contiguous axial images were obtained from the base of the skull through the vertex without intravenous contrast. RADIATION DOSE REDUCTION: This exam was performed according to the departmental dose-optimization program which includes automated exposure control, adjustment of the mA and/or kV according to patient size and/or use of iterative reconstruction technique. COMPARISON:  None Available. FINDINGS: Brain: No intracranial hemorrhage, mass effect, or midline shift. Age related atrophy per no hydrocephalus. The basilar cisterns are patent. Mild for age periventricular chronic small vessel ischemia. No evidence of territorial infarct or acute ischemia. No extra-axial or intracranial fluid collection. Vascular: Atherosclerosis of skullbase vasculature without hyperdense vessel or  abnormal calcification. Skull: No fracture or focal lesion. Sinuses/Orbits: No acute finding.  Bilateral cataract resection. Other: No confluent scalp hematoma. IMPRESSION: 1. No acute intracranial abnormality. No skull fracture. 2. Age related atrophy and chronic small vessel ischemia. Electronically Signed   By: Narda Rutherford M.D.   On: 10/02/2023 16:03   DG Forearm Right Result Date: 10/02/2023 CLINICAL DATA:  Fall.  Pain. EXAM: RIGHT FOREARM - 2 VIEW COMPARISON:  None Available. FINDINGS: No evidence of acute fracture. Mild degenerative change of the wrist and elbow, no dislocation. No erosions or focal bone abnormality. Vascular calcifications are seen. There is focal soft tissue prominence overlying the mid forearm. IMPRESSION: Focal soft tissue prominence overlying the mid forearm. No acute fracture or subluxation. Electronically Signed   By: Narda Rutherford M.D.   On: 10/02/2023 15:20   DG Pelvis Portable Result Date: 10/02/2023 CLINICAL DATA:  Fall. EXAM: PORTABLE PELVIS 1-2 VIEWS COMPARISON:  None Available. FINDINGS: The intertrochanteric and lateral right femur not included in the field of view. No evidence of pelvic fracture. Pubic rami are intact. No hip dislocation. Moderate bilateral hip osteoarthritis. Pubic symphysis and sacroiliac joints are congruent. Bilateral iliac stents. Right femoral stent is partially included. IMPRESSION: 1. No pelvic fracture. 2. Moderate bilateral hip osteoarthritis. Electronically Signed   By: Narda Rutherford M.D.   On: 10/02/2023 15:18   DG Chest Port 1 View Result Date: 10/02/2023 CLINICAL DATA:  Fall. EXAM: PORTABLE CHEST 1 VIEW COMPARISON:  Radiograph 05/10/2023.  CT 06/07/2023 FINDINGS: The cardiomediastinal contours are normal. The lungs are clear. Pulmonary vasculature is normal. No consolidation, pleural effusion, or pneumothorax. Reverse right shoulder arthroplasty. On limited assessment, no acute osseous finding. IMPRESSION: No active disease.  Electronically Signed   By: Narda Rutherford M.D.   On: 10/02/2023 15:16     Subjective: ***   Discharge Exam: Vitals:   10/04/23 0446 10/04/23 0728  BP:  (!) 124/56  Pulse:    Resp:  17  Temp:  (!) 97.5 F (36.4 C)  SpO2: 94% 91%   Vitals:   10/03/23 2002 10/04/23 0443 10/04/23 0446 10/04/23 0728  BP: (!) 117/93 (!) 109/50  (!) 124/56  Pulse: (!) 107 (!) 105    Resp:    17  Temp: 97.6 F (36.4 C) 97.9 F (36.6 C)  (!) 97.5 F (36.4 C)  TempSrc: Oral Oral  Oral  SpO2: 96% 91% 94% 91%  Weight:      Height:        General: Pt is alert, awake, not in acute distress Cardiovascular: RRR, S1/S2 +, no rubs, no gallops Respiratory: CTA bilaterally, no wheezing, no rhonchi Abdominal: Soft, NT, ND, bowel sounds + Extremities: no edema, no cyanosis    The results of significant diagnostics from this hospitalization (including imaging, microbiology, ancillary and laboratory) are listed below for reference.     Microbiology: No results found for this or any previous visit (from the past 240 hours).   Labs: BNP (last 3 results) Recent Labs    05/09/23 0454 05/10/23 0358 05/11/23 0323  BNP 361.6* 286.5* 338.1*   Basic Metabolic Panel: Recent Labs  Lab 10/02/23 1451 10/02/23 1522 10/03/23 0558  NA 140 140 142  K 4.5 4.5 4.5  CL 105 109 108  CO2 21*  --  24  GLUCOSE 193* 191* 121*  BUN 43* 42* 38*  CREATININE 2.60* 2.80* 2.23*  CALCIUM 9.3  --  9.2  MG  --   --  1.9  PHOS  --   --  3.8   Liver Function Tests: Recent Labs  Lab 10/02/23 1451  AST 23  ALT 19  ALKPHOS 71  BILITOT 0.5  PROT 7.3  ALBUMIN 3.7   No results for input(s): "LIPASE", "AMYLASE" in the last 168 hours. No results for input(s): "AMMONIA" in the last 168 hours. CBC: Recent Labs  Lab 10/02/23 1451 10/02/23 1522 10/03/23 0558  WBC 12.0*  --  10.2  NEUTROABS 9.4*  --   --   HGB 10.6* 10.5* 10.6*  HCT 33.3* 31.0* 31.4*  MCV 90.7  --  87.2  PLT 306  --  276   Cardiac  Enzymes: No results for input(s): "CKTOTAL", "CKMB", "CKMBINDEX", "TROPONINI" in the last 168 hours. BNP: Invalid input(s): "POCBNP" CBG: Recent Labs  Lab 10/03/23 1130 10/03/23 1618 10/03/23 2108 10/04/23 0623 10/04/23 1128  GLUCAP 170* 142* 120* 108* 145*   D-Dimer No results for input(s): "DDIMER" in the last 72 hours. Hgb A1c Recent Labs    10/02/23 1451  HGBA1C 8.0*   Lipid Profile No results for input(s): "CHOL", "HDL", "LDLCALC", "TRIG", "CHOLHDL", "LDLDIRECT" in the last 72 hours. Thyroid function studies No results for input(s): "TSH", "T4TOTAL", "T3FREE", "THYROIDAB" in the last 72 hours.  Invalid input(s): "FREET3" Anemia work up No results for input(s): "VITAMINB12", "FOLATE", "FERRITIN", "TIBC", "IRON", "RETICCTPCT" in the last 72 hours. Urinalysis    Component Value Date/Time   COLORURINE YELLOW 05/07/2023 1111   APPEARANCEUR CLEAR 05/07/2023 1111   LABSPEC 1.012 05/07/2023 1111   PHURINE 5.5 05/07/2023 1111   GLUCOSEU NEGATIVE 05/07/2023 1111   HGBUR SMALL (A) 05/07/2023 1111   BILIRUBINUR NEGATIVE 05/07/2023 1111   KETONESUR NEGATIVE 05/07/2023 1111   PROTEINUR TRACE (A) 05/07/2023 1111   NITRITE NEGATIVE 05/07/2023 1111   LEUKOCYTESUR NEGATIVE 05/07/2023 1111   Sepsis Labs Recent Labs  Lab 10/02/23 1451 10/03/23 0558  WBC 12.0* 10.2   Microbiology No results  found for this or any previous visit (from the past 240 hours).   Time coordinating discharge: Over 30 minutes  SIGNED:   Azucena Fallen, DO [facility] 10/04/2023, 12:51 PM Pager   If 7PM-7AM, please contact night-coverage [email or web address]

## 2023-10-04 NOTE — Progress Notes (Signed)
 Physical Therapy Treatment Patient Details Name: Christian Bailey MRN: [id number] DOB: September 30, 1940 Today's Date: 10/04/2023   History of Present Illness Patient is an 83 yo male presenting to the ED status post fall on 10/02/23. Patient hit head in fall, with CT of head and spine clear. MRI finding edematous changes from C2-C5. 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  Moderate foraminal stenosis bilaterally at C6-7 and on the right at C7-T1.  Mild foraminal narrowing bilaterally at C2-3 and C3-4. PMH includes:  peripheral artery disease on Plavix and Crestor, RBBB, chronic HFpEF, CKD 4, carotid stenosis, hypertension, hyperlipidemia, type 2 diabetes, chronic anxiety/depression, iron deficiency anemia, GERD    PT Comments  Continuing work on functional mobility and activity tolerance;  Session focused on functional mobility, and specifically ambulation to pt's tolerance; He tolerated rolling and coming to sit at EOB, despite considerable pain at neck, and bil shoulders (R shoulder worse than L with preevious injury as well) and spasm/clonus bil LEs; Pt anxious and fearful of falling; opted to use the stedy first, to help incr pt's confidence with sit<>stands, and progressed to serial sit<>stands x 5 from stedy height; then stood from recliner to RW x2; less stable, but still with better confidence; then we walked with chair follow for safety and to build confidence; Ataxic gait, with the need for Mod assist and near constant tactile cueing for bil hip extension;   Pt's wife was present and helpful throughout session; He has ataxic gait, shooting pains, LE spasms/clonus with spondylosis and an acute fall in the setting of spinal cord compression; he is making considerable progress, and has both PT and OT needs; I believe it is worth considering AIR fo rpost-acute rehab to maximize independence and safety with mobility and  ADLs to prepare for safe transition back to home   If plan is discharge home, recommend the following: Two people to help with walking and/or transfers;Two people to help with bathing/dressing/bathroom;Help with stairs or ramp for entrance;Assist for transportation;Assistance with cooking/housework   Can travel by private vehicle     No  Equipment Recommendations  Wheelchair (measurements PT);Wheelchair cushion (measurements PT);Hospital bed    Recommendations for Other Services Rehab consult     Precautions / Restrictions Precautions Precautions: Fall Recall of Precautions/Restrictions: Intact Required Braces or Orthoses: Cervical Brace Cervical Brace: Soft collar (no wear schedule specified) Restrictions Weight Bearing Restrictions Per Provider Order: No     Mobility  Bed Mobility Overal bed mobility: Needs Assistance Bed Mobility: Rolling, Sidelying to Sit Rolling: Mod assist Sidelying to sit: +2 for physical assistance, Mod assist, Used rails       General bed mobility comments: 2 person assist to elevate trunk to EOB; very paindul, but tp wanting to continue    Transfers Overall transfer level: Needs assistance Equipment used: Ambulation equipment used, Rolling walker (2 wheels) Transfers: Sit to/from Stand Sit to Stand: +2 physical assistance, Min assist, From elevated surface           General transfer comment: bilateral support given at gait belt and shoulder girdle; stood x 5 from higher stedy seat, and stood x2 from lwer recliner weat Transfer via Lift Equipment: Stedy  Ambulation/Gait Ambulation/Gait assistance: Mod assist, +2 physical assistance, +2 safety/equipment Gait Distance (Feet): 25 Feet Assistive device: Rolling walker (2 wheels) Gait Pattern/deviations: Step-through pattern, Decreased step length - right, Decreased step length - left, Ataxic  General Gait Details: Unsteady throughout with heavy dependence on UE support; step width  varying singificantly, with the need for near constant support; occasional siciisoring   Stairs             Wheelchair Mobility     Tilt Bed    Modified Rankin (Stroke Patients Only)       Balance     Sitting balance-Leahy Scale: Poor       Standing balance-Leahy Scale: Poor                              Communication Communication Communication: No apparent difficulties  Cognition Arousal: Alert Behavior During Therapy: WFL for tasks assessed/performed                           PT - Cognition Comments: Internally distracted by pain Following commands: Intact      Cueing Cueing Techniques: Verbal cues, Tactile cues  Exercises      General Comments General comments (skin integrity, edema, etc.): Noting bil clonus and LE spasm; Wife present and helpful throughout session      Pertinent Vitals/Pain Pain Assessment Pain Assessment: Faces Faces Pain Scale: Hurts whole lot Pain Location: neck/back; also LE spasm Pain Descriptors / Indicators: Grimacing, Guarding, Moaning, Pins and needles, Radiating Pain Intervention(s): Monitored during session, Premedicated before session, Repositioned    Home Living                          Prior Function            PT Goals (current goals can now be found in the care plan section) Acute Rehab PT Goals Patient Stated Goal: home PT Goal Formulation: With patient Time For Goal Achievement: 10/17/23 Potential to Achieve Goals: Good Progress towards PT goals: Progressing toward goals    Frequency    Min 2X/week      PT Plan      Co-evaluation              AM-PAC PT "6 Clicks" Mobility   Outcome Measure  Help needed turning from your back to your side while in a flat bed without using bedrails?: A Lot Help needed moving from lying on your back to sitting on the side of a flat bed without using bedrails?: A Lot Help needed moving to and from a bed to a chair  (including a wheelchair)?: A Lot Help needed standing up from a chair using your arms (e.g., wheelchair or bedside chair)?: Total Help needed to walk in hospital room?: Total Help needed climbing 3-5 steps with a railing? : Total 6 Click Score: 9    End of Session Equipment Utilized During Treatment: Gait belt;Cervical collar Activity Tolerance: Patient tolerated treatment well Patient left: in chair;with call bell/phone within reach;with family/visitor present;with chair alarm set Nurse Communication: Mobility status;Other (comment) (consider AIR) PT Visit Diagnosis: Other abnormalities of gait and mobility (R26.89);Muscle weakness (generalized) (M62.81);Repeated falls (R29.6);Pain Pain - part of body:  (Neck and back, Bil LEs)     Time: 1610-9604 PT Time Calculation (min) (ACUTE ONLY): 50 min  Charges:    $Gait Training: 23-37 mins $Therapeutic Activity: 8-22 mins PT General Charges $$ ACUTE PT VISIT: 1 Visit                     Van Clines, PT  Acute  Rehabilitation Services Office [phone] Secure Chat welcomed    Christian Bailey 10/04/2023, 2:07 PM

## 2023-10-04 NOTE — Progress Notes (Signed)

## 2023-10-04 NOTE — Progress Notes (Signed)
 Occupational Therapy Treatment Patient Details Name: Christian Bailey MRN: [id number] DOB: Nov 04, 1940 Today's Date: 10/04/2023   History of present illness Patient is an 83 yo male presenting to the ED status post fall on 10/02/23. Patient hit head in fall, with CT of head and spine clear. MRI finding edematous changes from C2-C5. 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  Moderate foraminal stenosis bilaterally at C6-7 and on the right at C7-T1.  Mild foraminal narrowing bilaterally at C2-3 and C3-4. PMH includes:  peripheral artery disease on Plavix and Crestor, RBBB, chronic HFpEF, CKD 4, carotid stenosis, hypertension, hyperlipidemia, type 2 diabetes, chronic anxiety/depression, iron deficiency anemia, GERD   OT comments  Pt making progress with functional goals. Max A required to scoot hips to edge of chair, mod A sit - stand for chair to RW with increased time and effort required. Pt returned to sitting in recliner mod A with max A to scoot hips back in chair. Pt participated in grooming task, simulated LB bathing tasks max A , UB dressing min A. Pt with Poor siting ballance with posterior leaning. Pt motivated and appreciative of therapy services. OT will continue to follow acutely to maximize level of function and safety        If plan is discharge home, recommend the following:  Two people to help with walking and/or transfers;A lot of help with bathing/dressing/bathroom;Assistance with cooking/housework;Assist for transportation;Help with stairs or ramp for entrance   Equipment Recommendations  Wheelchair (measurements OT);Wheelchair cushion (measurements OT)    Recommendations for Other Services      Precautions / Restrictions Precautions Precautions: Fall Recall of Precautions/Restrictions: Intact Required Braces or Orthoses: Cervical Brace Cervical Brace: Soft collar Restrictions Weight  Bearing Restrictions Per Provider Order: No       Mobility Bed Mobility               General bed mobility comments: pt in chair    Transfers Overall transfer level: Needs assistance Equipment used: Rolling walker (2 wheels) Transfers: Sit to/from Stand Sit to Stand: Mod assist                 Balance Overall balance assessment: Needs assistance Sitting-balance support: Bilateral upper extremity supported, Feet supported Sitting balance-Leahy Scale: Poor Sitting balance - Comments: frequent LOB posteriorly sitting EOB Postural control: Posterior lean Standing balance support: Bilateral upper extremity supported, During functional activity Standing balance-Leahy Scale: Poor                             ADL either performed or assessed with clinical judgement   ADL Overall ADL's : Needs assistance/impaired     Grooming: Wash/dry hands;Wash/dry face;Supervision/safety;Sitting       Lower Body Bathing: Maximal assistance;Sitting/lateral leans Lower Body Bathing Details (indicate cue type and reason): simulated Upper Body Dressing : Minimal assistance;Sitting       Toilet Transfer: Moderate assistance;Rolling walker (2 wheels);Stand-pivot;BSC/3in1   Toileting- Architect and Hygiene: Total assistance;Sitting/lateral lean;Sit to/from stand       Functional mobility during ADLs: Maximal assistance;Moderate assistance;+2 for physical assistance;Cueing for safety;Cueing for sequencing      Extremity/Trunk Assessment Upper Extremity Assessment Upper Extremity Assessment: Generalized weakness   Lower Extremity Assessment Lower Extremity Assessment: Defer to PT evaluation   Cervical / Trunk Assessment Cervical / Trunk Assessment: Other exceptions Cervical / Trunk Exceptions: cervical ligamentous injury  Vision Baseline Vision/History: 1 Wears glasses Ability to See in Adequate Light: 0 Adequate Patient Visual Report: No change  from baseline     Perception     Praxis     Communication Communication Communication: No apparent difficulties Factors Affecting Communication: Hearing impaired   Cognition Arousal: Alert   Cognition: No apparent impairments                               Following commands: Intact        Cueing   Cueing Techniques: Verbal cues, Tactile cues  Exercises      Shoulder Instructions       General Comments Noting bil clonus and LE spasm; Wife present and helpful throughout session    Pertinent Vitals/ Pain       Pain Assessment Pain Assessment: Faces Faces Pain Scale: Hurts even more Pain Location: neck/back; also LE spasm Pain Descriptors / Indicators: Grimacing, Guarding, Moaning, Pins and needles, Radiating Pain Intervention(s): Monitored during session, Limited activity within patient's tolerance, Repositioned, Premedicated before session  Home Living                                          Prior Functioning/Environment              Frequency  Min 2X/week        Progress Toward Goals  OT Goals(current goals can now be found in the care plan section)  Progress towards OT goals: Progressing toward goals     Plan      Co-evaluation                 AM-PAC OT "6 Clicks" Daily Activity     Outcome Measure   Help from another person eating meals?: A Little Help from another person taking care of personal grooming?: A Little Help from another person toileting, which includes using toliet, bedpan, or urinal?: Total Help from another person bathing (including washing, rinsing, drying)?: A Lot Help from another person to put on and taking off regular upper body clothing?: A Little Help from another person to put on and taking off regular lower body clothing?: Total 6 Click Score: 13    End of Session Equipment Utilized During Treatment: Gait belt;Cervical collar;Other (comment) (RW)  OT Visit Diagnosis:  Unsteadiness on feet (R26.81);Other abnormalities of gait and mobility (R26.89);Repeated falls (R29.6);Muscle weakness (generalized) (M62.81);History of falling (Z91.81);Pain Pain - part of body:  (neck, back)   Activity Tolerance Patient limited by fatigue;Patient limited by pain   Patient Left with call bell/phone within reach;in chair;with chair alarm set;with family/visitor present   Nurse Communication          Time: 1610-9604 OT Time Calculation (min): 21 min  Charges: OT General Charges $OT Visit: 1 Visit OT Treatments $Therapeutic Activity: 8-22 mins   Galen Manila 10/04/2023, 3:28 PM

## 2023-10-05 ENCOUNTER — Inpatient Hospital Stay (HOSPITAL_COMMUNITY)

## 2023-10-05 DIAGNOSIS — I5032 Chronic diastolic (congestive) heart failure: Secondary | ICD-10-CM | POA: Diagnosis not present

## 2023-10-05 DIAGNOSIS — N184 Chronic kidney disease, stage 4 (severe): Secondary | ICD-10-CM | POA: Diagnosis not present

## 2023-10-05 DIAGNOSIS — G8250 Quadriplegia, unspecified: Secondary | ICD-10-CM

## 2023-10-05 DIAGNOSIS — R410 Disorientation, unspecified: Secondary | ICD-10-CM

## 2023-10-05 DIAGNOSIS — R52 Pain, unspecified: Secondary | ICD-10-CM | POA: Diagnosis not present

## 2023-10-05 DIAGNOSIS — R443 Hallucinations, unspecified: Secondary | ICD-10-CM | POA: Diagnosis not present

## 2023-10-05 DIAGNOSIS — R252 Cramp and spasm: Secondary | ICD-10-CM | POA: Diagnosis not present

## 2023-10-05 DIAGNOSIS — R262 Difficulty in walking, not elsewhere classified: Secondary | ICD-10-CM | POA: Diagnosis not present

## 2023-10-05 LAB — CBC
HCT: 34.6 % — ABNORMAL LOW (ref 39.0–52.0)
Hemoglobin: 11.8 g/dL — ABNORMAL LOW (ref 13.0–17.0)
MCH: 29.2 pg (ref 26.0–34.0)
MCHC: 34.1 g/dL (ref 30.0–36.0)
MCV: 85.6 fL (ref 80.0–100.0)
Platelets: 303 10*3/uL (ref 150–400)
RBC: 4.04 MIL/uL — ABNORMAL LOW (ref 4.22–5.81)
RDW: 13.5 % (ref 11.5–15.5)
WBC: 19.1 10*3/uL — ABNORMAL HIGH (ref 4.0–10.5)
nRBC: 0 % (ref 0.0–0.2)

## 2023-10-05 LAB — COMPREHENSIVE METABOLIC PANEL WITH GFR
ALT: 17 U/L (ref 0–44)
AST: 25 U/L (ref 15–41)
Albumin: 3.5 g/dL (ref 3.5–5.0)
Alkaline Phosphatase: 66 U/L (ref 38–126)
Anion gap: 12 (ref 5–15)
BUN: 39 mg/dL — ABNORMAL HIGH (ref 8–23)
CO2: 21 mmol/L — ABNORMAL LOW (ref 22–32)
Calcium: 9 mg/dL (ref 8.9–10.3)
Chloride: 99 mmol/L (ref 98–111)
Creatinine, Ser: 2.28 mg/dL — ABNORMAL HIGH (ref 0.61–1.24)
GFR, Estimated: 28 mL/min — ABNORMAL LOW (ref >60)
Glucose, Bld: 168 mg/dL — ABNORMAL HIGH (ref 70–99)
Potassium: 4.7 mmol/L (ref 3.5–5.1)
Sodium: 132 mmol/L — ABNORMAL LOW (ref 135–145)
Total Bilirubin: 1 mg/dL (ref 0.0–1.2)
Total Protein: 7.5 g/dL (ref 6.5–8.1)

## 2023-10-05 LAB — GLUCOSE, CAPILLARY
Glucose-Capillary: 165 mg/dL — ABNORMAL HIGH (ref 70–99)
Glucose-Capillary: 172 mg/dL — ABNORMAL HIGH (ref 70–99)
Glucose-Capillary: 158 mg/dL — ABNORMAL HIGH (ref 70–99)
Glucose-Capillary: 158 mg/dL — ABNORMAL HIGH (ref 70–99)

## 2023-10-05 MED ORDER — SODIUM CHLORIDE 0.9 % IV SOLN: INTRAVENOUS | Status: DC

## 2023-10-05 MED ORDER — SODIUM CHLORIDE 0.9 % IV SOLN
1.0000 g | INTRAVENOUS | Status: AC
  Administered 2023-10-05 – 2023-10-07 (×3): 1 g via INTRAVENOUS
  Filled 2023-10-05 (×3): qty 10

## 2023-10-05 MED ORDER — SODIUM CHLORIDE 0.9 % IV SOLN
500.0000 mg | INTRAVENOUS | Status: DC
  Administered 2023-10-05: 500 mg via INTRAVENOUS
  Filled 2023-10-05 (×2): qty 5

## 2023-10-05 NOTE — PMR Pre-admission (Signed)
 PMR Admission Coordinator Pre-Admission Assessment  Patient: Christian Bailey is an 83 y.o., male MRN: [id number] DOB: [birth year] Height: 5\' 5"  (165.1 cm) Weight: 65 kg  Insurance Information HMO:     PPO:      PCP:      IPA:      80/20: yes     OTHER:  PRIMARY: Medicare A and B      Policy#: [id number]    Subscriber:  CM Name:       Phone#:      Fax#:  Pre-Cert#: verified Health and safety inspector:  Benefits:  Phone #:      Name:  Eff. Date: a and B 06/29/2006    Deduct: $1632      Out of Pocket Max: n/a      Life Max: n/a CIR: 100%      SNF: 20 full days Outpatient:      Co-Pay:  Home Health: 100%      Co-Pay:  DME:      Co-Pay:  Providers: in network SECONDARY: T[employer] for Life      Policy#: [id number]      Phone#:   Financial Counselor:       Phone#:   The "Data Collection Information Summary" for patients in Inpatient Rehabilitation Facilities with attached "Privacy Act Statement-Health Care Records" was provided and verbally reviewed with: Family  Emergency Contact Information Contact Information     Name Relation Home Work Mobile   Mcphearson,[name] Spouse [phone]  [phone]   Alesandro, Stueve [phone]  [phone]      Other Contacts   None on File     Current Medical History  Patient Admitting Diagnosis: Cervical Myelopathy History of Present Illness: Christian Bailey is a 83 y.o. male  with hx of dCHF, CKD4 with Cr ~ 2.2; HTN, HLD; DM with A1c of 8.0; anxiety and depression and frequent falls who was admitted to [facility] on 10/02/23 after another fall at home. He was found to have 5mm of canal at C5/6 and C6/7- with compression of Spinal cord And moderate foraminal stenosis.  Pt initially describes that legs will "go out" and he will fall- denies lightheadedness. In the emergency room, Temperature 97.5.  BP 148/73, pulse 101, respiration rate 20, O2 saturation of 100% on room air.  Lab studies notable for elevated creatinine above baseline, BUN 42,  creatinine 2.80 with GFR 24.  WBC 12.0.  Hemoglobin 10.5.  Platelet count 306.  Neutrophil count 9.4.  Chest x-ray showed no active disease.   Pt. With some hospital delirium that appears to be resolving. Delirium felt due to narcotics. Pt. Underwent Cervical decompression with Neurosurgery 10/12/23. Post operatively, PT/OT recommended CIR to assist return th PLOF. Palliative care also saw pt during this admission and pt. Expressed desire for aggressive rehab so that pt. Can return home with his wife.   Patient's medical record from [facility] has been reviewed by the rehabilitation admission coordinator and physician.  Past Medical History  Past Medical History:  Diagnosis Date   Anemia    Complication of anesthesia    Diabetes mellitus (HCC)    TYPE 2   GERD (gastroesophageal reflux disease)    History of blood transfusion    GI bleed   History of colon polyps    History of hiatal hernia    Hyperlipemia    Hypertension    Osteoarthritis    PAD (peripheral artery disease) (HCC)    a.  stenting of his left common iliac artery >20 years ago. b. h/o LEIA stent and 2 stents to R SFA in 2011. c. 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 Noted during admission 04/2014 but upon review seems to be frequent finding for patient.   Stenosis of carotid artery    a. 50% right carotid stenosis by angiogram 04/2014.    Has the patient had major surgery during 100 days prior to admission? Yes  Family History   family history includes Alcoholism in his brother; Diabetes type II in his brother; Esophageal cancer in his brother; Heart  disease in his mother; Leukemia in his brother, brother, and mother; Liver disease in his brother; Lung disease in his sister; Stomach cancer in his father.  Current Medications  Current Facility-Administered Medications:    0.9 %  sodium chloride  infusion, , Intravenous, Continuous, Haydee Lipa, MD   acetaminophen  (TYLENOL ) tablet 650 mg, 650 mg, Oral, Q6H PRN, Bary Boss, DO, 650 mg at 10/05/23 0533   cyclobenzaprine  (FLEXERIL ) tablet 5 mg, 5 mg, Oral, TID PRN, Haydee Lipa, MD, 5 mg at 10/04/23 2240   DULoxetine  (CYMBALTA ) DR capsule 20 mg, 20 mg, Oral, Daily, Hall, Carole N, DO, 20 mg at 10/05/23 1610   feeding supplement (ENSURE ENLIVE / ENSURE PLUS) liquid 237 mL, 237 mL, Oral, BID BM, Haydee Lipa, MD, 237 mL at 10/05/23 0843   ferrous sulfate  tablet 325 mg, 325 mg, Oral, Q breakfast, Reesa Cannon N, DO, 325 mg at 10/05/23 9604   insulin  aspart (novoLOG ) injection 0-5 Units, 0-5 Units, Subcutaneous, QHS, Hall, Carole N, DO   insulin  aspart (novoLOG ) injection 0-9 Units, 0-9 Units, Subcutaneous, TID WC, Hall, Carole N, DO, 2 Units at 10/05/23 1217   melatonin tablet 5 mg, 5 mg, Oral, QHS PRN, Bary Boss, DO   mirabegron  ER (MYRBETRIQ ) tablet 50 mg, 50 mg, Oral, Daily, Haydee Lipa, MD, 50 mg at 10/05/23 5409   oxyCODONE  (Oxy IR/ROXICODONE ) immediate release tablet 2.5 mg, 2.5 mg, Oral, Q4H PRN, Haydee Lipa, MD, 2.5 mg at 10/04/23 8119   pantoprazole  (PROTONIX ) EC tablet 40 mg, 40 mg, Oral, BID, Reesa Cannon N, DO, 40 mg at 10/05/23 1478   polyethylene glycol (MIRALAX  / GLYCOLAX ) packet 17 g, 17 g, Oral, Daily PRN, Hall, Carole N, DO   pregabalin  (LYRICA ) capsule 50 mg, 50 mg, Oral, BID, Haydee Lipa, MD, 50 mg at 10/05/23 2956   prochlorperazine  (COMPAZINE ) injection 5 mg, 5 mg, Intravenous, Q6H PRN, [place], Carole N, DO   rosuvastatin  (CRESTOR ) tablet 20 mg, 20 mg, Oral, QPM, Hall, Carole N, DO, 20 mg at 10/04/23 1722   traMADol   (ULTRAM ) tablet 50-100 mg, 50-100 mg, Oral, Q6H PRN, Haydee Lipa, MD, 50 mg at 10/05/23 2130  Patients Current Diet:  Diet Order             Diet heart healthy/carb modified Room service appropriate? Yes; Fluid consistency: Thin  Diet effective now                   Precautions / Restrictions Precautions Precautions: Fall Cervical  Brace: Soft collar Restrictions Weight Bearing Restrictions Per Provider Order: No   Has the patient had 2 or more falls or a fall with injury in the past year? Yes  Prior Activity Level    Prior Functional Level Self Care: Did the patient need help bathing, dressing, using the toilet or eating? Needed some help  Indoor Mobility: Did the patient need assistance with walking from room to room (with or without device)? Needed some help  Stairs: Did the patient need assistance with internal or external stairs (with or without device)? Needed some help  Functional Cognition: Did the patient need help planning regular tasks such as shopping or remembering to take medications? Needed some help  Patient Information Are you of Hispanic, Latino/a,or Spanish origin?: A. No, not of Hispanic, Latino/a, or Spanish origin What is your race?: A. White Do you need or want an interpreter to communicate with a doctor or health care staff?: 0. No  Patient's Response To:  Health Literacy and Transportation Is the patient able to respond to health literacy and transportation needs?: Yes Health Literacy - How often do you need to have someone help you when you read instructions, pamphlets, or other written material from your doctor or pharmacy?: Never In the past 12 months, has lack of transportation kept you from medical appointments or from getting medications?: No In the past 12 months, has lack of transportation kept you from meetings, work, or from getting things needed for daily living?: No  Home Assistive Devices / Equipment Home Equipment:  Agricultural consultant (2 wheels), Rollator (4 wheels), Shower seat - built in, [employer] equipment  Prior Device Use: Indicate devices/aids used by the patient prior to current illness, exacerbation or injury? Walker  Current Functional Level Cognition  Orientation Level: Oriented to person, Oriented to situation, Oriented to place, Disoriented to time    Extremity Assessment (includes Sensation/Coordination)  Upper Extremity Assessment: Generalized weakness  Lower Extremity Assessment: Defer to PT evaluation    ADLs  Overall ADL's : Needs assistance/impaired Eating/Feeding: Set up, Sitting Grooming: Wash/dry hands, Wash/dry face, Supervision/safety, Sitting Upper Body Bathing: Minimal assistance, Sitting Lower Body Bathing: Maximal assistance, Sitting/lateral leans Lower Body Bathing Details (indicate cue type and reason): simulated Upper Body Dressing : Minimal assistance, Sitting Lower Body Dressing: Maximal assistance, Total assistance, Sit to/from stand, Sitting/lateral leans Toilet Transfer: Moderate assistance, Rolling walker (2 wheels), Stand-pivot, BSC/3in1 Toilet Transfer Details (indicate cue type and reason): simulated with sit<>stand Toileting- Clothing Manipulation and Hygiene: Total assistance, Sitting/lateral lean, Sit to/from stand Toileting - Clothing Manipulation Details (indicate cue type and reason): unable to complete in standing Functional mobility during ADLs: Maximal assistance, Moderate assistance, +2 for physical assistance, Cueing for safety, Cueing for sequencing General ADL Comments: Prior to this admission, patient living with his wife, and able to walk with a RW. Patient endorses that he has required significantly more assist with all aspects of care lately. Currently patient  is +2 mod assist supine <> sit, and +2 min/HHA sit to stand. Patient with poor sitting balance, and unable to transition to steps despite increased assist. During transition sit to sidelying,  pt reporting an 'electric shock' down BLE. In bed, pt only tolerating HOB up to 10 degrees. Patient is currently mod to max A for ADL management. Given current level of function, OT is recommending rehab <  3 hours prior to return to home. If patient is able to return home, patient would require increased DME (see below and PT recommendation). OT will  continue to follow acutely.    Mobility  Overal bed mobility: Needs Assistance Bed Mobility: Rolling, Sidelying to Sit Rolling: Mod assist Sidelying to sit: +2 for physical assistance, Mod assist, Used rails Sit to sidelying: +2 for physical assistance, Mod assist General bed mobility comments: pt in chair    Transfers  Overall transfer level: Needs assistance Equipment used: Rolling walker (2 wheels) Transfers: Sit to/from Stand Sit to Stand: Mod assist Transfer via Lift Equipment: Stedy General transfer comment: bilateral support given at gait belt and shoulder girdle; stood x 5 from higher stedy seat, and stood x2 from lwer recliner weat    Ambulation / Gait / Stairs / Wheelchair Mobility  Ambulation/Gait Ambulation/Gait assistance: Mod assist, +2 physical assistance, +2 safety/equipment Gait Distance (Feet): 25 Feet Assistive device: Rolling walker (2 wheels) Gait Pattern/deviations: Step-through pattern, Decreased step length - right, Decreased step length - left, Ataxic General Gait Details: Unsteady throughout with heavy dependence on UE support; step width varying singificantly, with the need for near constant support; occasional siciisoring    Posture / Balance Dynamic Sitting Balance Sitting balance - Comments: frequent LOB posteriorly sitting EOB Balance Overall balance assessment: Needs assistance Sitting-balance support: Bilateral upper extremity supported, Feet supported Sitting balance-Leahy Scale: Poor Sitting balance - Comments: frequent LOB posteriorly sitting EOB Postural control: Posterior lean Standing balance  support: Bilateral upper extremity supported, During functional activity Standing balance-Leahy Scale: Poor Standing balance comment: static stand 2-person assist    Special needs/care consideration Skin surgical incision  and Special service needs none   Previous Home Environment (from acute therapy documentation) Living Arrangements: Spouse/significant other Available Help at Discharge: Family, Available 24 hours/day Type of Home: House Home Layout: One level Home Access: Stairs to enter Entrance Stairs-Rails: Left Entrance Stairs-Number of Steps: 3 Bathroom Shower/Tub: Health visitor: Handicapped height Bathroom Accessibility: Yes How Accessible: Accessible via walker Home Care Services: No  Discharge Living Setting Plans for Discharge Living Setting: Patient's home, House Type of Home at Discharge: House Discharge Home Layout: Two level, Able to live on main level with bedroom/bathroom Alternate Level Stairs-Rails: None Alternate Level Stairs-Number of Steps: flight Discharge Home Access: Ramped entrance, Stairs to enter Entrance Stairs-Rails: None Entrance Stairs-Number of Steps: 2 Discharge Bathroom Shower/Tub: Walk-in shower Discharge Bathroom Toilet: Handicapped height Discharge Bathroom Accessibility: Yes How Accessible: Accessible via walker Does the patient have any problems obtaining your medications?: No  Social/Family/Support Systems Patient Roles: Spouse Contact Information: [phone] Anticipated Caregiver: [name] Caregiver Availability: 24/7 Discharge Plan Discussed with Primary Caregiver: Yes Is Caregiver In Agreement with Plan?: No Does Caregiver/Family have Issues with Lodging/Transportation while Pt is in Rehab?: Yes  Goals Patient/Family Goal for Rehab: PT/OT/SLP Min A Expected length of stay: 14-16 days Pt/Family Agrees to Admission and willing to participate: Yes Program Orientation Provided & Reviewed with Pt/Caregiver  Including Roles  & Responsibilities: Yes  Decrease burden of Care through IP rehab admission: Decrease number of caregivers, Bowel and bladder program, and Patient/family education  Possible need for SNF placement upon discharge: not anticipated   Patient Condition: This patient's medical and functional status has changed since the consult dated: 10/05/23 in which the Rehabilitation Physician determined and documented that the patient's condition is appropriate for intensive rehabilitative care in an inpatient rehabilitation facility. See "History of Present Illness" (above) for medical update. Functional changes are: pt. Has had surgical decompression and is now mod=2 to max+2 with mobility and ADLS. Patient's medical and functional status update has been discussed with the Rehabilitation  physician and patient remains appropriate for inpatient rehabilitation. Will admit to inpatient rehab today.  Preadmission Screen Completed By:  Dorena Gander, 10/05/2023 2:40 PM ______________________________________________________________________   Discussed status with Dr. Sharl Davies  on 10/18/23 at 1052 and received approval for admission today.  Admission Coordinator:  Dorena Gander, CCC-SLP, time 1052/Date 10/18/23

## 2023-10-05 NOTE — Progress Notes (Signed)
 Inpatient Rehab Admissions Coordinator:    Neurosurgery now to see pt. I will not try to bring to CIR until his work up and any intervention is complete. I did speak with Pt.'s wife to discuss potential CIR admit and she confirms that she wants pt. To come and that she can provide 24/7 min A.   Megan Salon, MS, CCC-SLP Rehab Admissions Coordinator  [phone] (celll) [phone] (office)

## 2023-10-05 NOTE — Consult Note (Signed)
 Physical Medicine and Rehabilitation Consult Reason for Consult:SCI Referring Physician: Dr  Carma Leaven   HPI: Christian Bailey is a 83 y.o. male  with hx of dCHF, CKD4 with Cr ~ 2.2; HTN, HLD; DM with A1c of 8.0; anxiety and depression and frequent falls who was admitted 4/5- after another fall at home. He was found to have 5mm of canal at C5/6 and C6/7- with compression of Spinal cord.  And moderate foraminal stenosis.  Pt initially describes that legs will "go out" and he will fall- denies lightheadedness.   He's wearing a soft collar.  At home on Flexeril and Lyrica- here Oxy and Tramadol added for severe pain.   Pt reports doesn't know when last had BM- per chart was 4/4 -before admission.      Review of Systems  Unable to perform ROS: Mental acuity  All other systems reviewed and are negative.  limited by pt's confusion/delirium Past Medical History:  Diagnosis Date   Anemia    Complication of anesthesia    Diabetes mellitus (HCC)    TYPE 2   GERD (gastroesophageal reflux disease)    History of blood transfusion    GI bleed   History of colon polyps    History of hiatal hernia    Hyperlipemia    Hypertension    Osteoarthritis    PAD (peripheral artery disease) (HCC)    a. stenting of his left common iliac artery >20 years ago. b. h/o LEIA stent and 2 stents to R SFA in 2011. c. 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 Noted during admission 04/2014 but upon review seems to be frequent finding for patient.   Stenosis of carotid artery    a. 50% right carotid stenosis by angiogram 04/2014.   Past Surgical  History:  Procedure Laterality Date   ABDOMINAL AORTOGRAM W/LOWER EXTREMITY N/A 01/27/2017   Procedure: Abdominal Aortogram w/Lower Extremity;  Surgeon: Nada Libman, MD;  Location: [facility];  Service: Cardiovascular;  Laterality: N/A;   ABDOMINAL AORTOGRAM W/LOWER EXTREMITY N/A 06/08/2017   Procedure: ABDOMINAL AORTOGRAM W/LOWER EXTREMITY;  Surgeon: Nada Libman, MD;  Location: [facility];  Service: Cardiovascular;  Laterality: N/A;   ABDOMINAL AORTOGRAM W/LOWER EXTREMITY N/A 08/31/2017   Procedure: ABDOMINAL AORTOGRAM W/LOWER EXTREMITY;  Surgeon: Nada Libman, MD;  Location: [facility];  Service: Cardiovascular;  Laterality: N/A;  rt. unilateral   ANGIOPLASTY / STENTING FEMORAL     ANGIOPLASTY / STENTING ILIAC     AORTA - BILATERAL FEMORAL ARTERY BYPASS GRAFT N/A 07/06/2018   Procedure: AORTA BIFEMORAL BYPASS GRAFT USING 14X7MM X 40CM HEMASHIELD GOLD GRAFT;  Surgeon: Nada Libman, MD;  Location: [facility];  Service: Vascular;  Laterality: N/A;   CEREBRAL ANGIOGRAM N/A 05/14/2014   Procedure: CEREBRAL ANGIOGRAM;  Surgeon: Runell Gess, MD;  Location: [facility] CATH LAB;  Service: Cardiovascular;  Laterality: N/A;   COLONOSCOPY W/ POLYPECTOMY     ENDARTERECTOMY Right 09/08/2017   ENDARTERECTOMY FEMORAL Right 09/08/2017   Procedure: REDO RIGHT FEMORAL ENDARTECTOMY WITH PATCH ANGIOPLASTY.;  Surgeon: Nada Libman, MD;  Location: [facility] OR;  Service: Vascular;  Laterality: Right;   EYE SURGERY Bilateral    cataract   FEMORAL ARTERY STENT Right 05/12/2010   Stented distally with a 6x100 Abbott absolute stent and proximally with a 6x60 Cook Zilver stent resulting in the reduction of the proximal segment 80% and mid segment 60-70% to 0% residual, LEFT common femoral artery stented with a 7x3 Smart stent resulting in reduction of 90% stenosis to 0% residual   FEMORAL-POPLITEAL BYPASS GRAFT Right 09/18/2016   Procedure: BYPASS GRAFT FEMORAL-POPLITEAL ARTERY;  Surgeon: Nada Libman, MD;  Location: [facility];  Service: Vascular;  Laterality: Right;   FEMORAL-POPLITEAL BYPASS GRAFT Right 09/08/2017   Procedure: REDO BYPASS GRAFT FEMORAL-POPLITEAL ARTERY;  Surgeon: Nada Libman, MD;  Location: [facility];  Service: Vascular;  Laterality: Right;   FEMORAL-POPLITEAL BYPASS GRAFT Right 07/06/2018   Procedure: REVISION RIGHT FEMORAL TO POPLITEAL ARTERY BYPASS GRAFT;  Surgeon: Nada Libman, MD;  Location: [facility];  Service: Vascular;  Laterality: Right;   IR CHOLANGIOGRAM EXISTING TUBE  08/04/2018   IR EXCHANGE BILIARY DRAIN  01/22/2019   IR PERC CHOLECYSTOSTOMY  07/19/2018   IR RADIOLOGIST EVAL & MGMT  08/31/2018   IR RADIOLOGIST EVAL & MGMT  01/17/2019   IR RADIOLOGIST EVAL & MGMT  02/01/2019   IR RADIOLOGIST EVAL & MGMT  02/08/2019   KNEE ARTHROSCOPY     left   LOWER EXTREMITY ANGIOGRAM N/A 05/14/2014   Procedure: LOWER EXTREMITY ANGIOGRAM;  Surgeon: Runell Gess, MD;  Location: [facility] CATH LAB;  Service: Cardiovascular;  Laterality: N/A;   LOWER EXTREMITY ANGIOGRAPHY N/A 09/21/2016   Procedure: Lower Extremity Angiography;  Surgeon: Fransisco Hertz, MD;  Location: [facility] INVASIVE CV LAB;  Service: Cardiovascular;  Laterality: N/A;   PERIPHERAL VASCULAR ATHERECTOMY Right 01/27/2017   Procedure: PERIPHERAL VASCULAR ATHERECTOMY;  Surgeon: Nada Libman, MD;  [facility]: [facility];  Service: Cardiovascular;  Laterality: Right;   PERIPHERAL VASCULAR BALLOON ANGIOPLASTY Right 06/08/2017   Procedure: PERIPHERAL VASCULAR BALLOON ANGIOPLASTY;  Surgeon: Nada Libman, MD;  Location: [facility];  Service: Cardiovascular;  Laterality: Right;  common femoral and superficial femoral arteries   PERIPHERAL VASCULAR CATHETERIZATION N/A 04/20/2016   Procedure: Lower Extremity Intervention;  Surgeon: Runell Gess, MD;  Location: [facility] INVASIVE CV LAB;  Service: Cardiovascular;  Laterality: N/A;   PERIPHERAL VASCULAR INTERVENTION  08/31/2017   Procedure: PERIPHERAL VASCULAR INTERVENTION;   Surgeon: Nada Libman, MD;  Location: [facility];  Service: Cardiovascular;;  REIA   REVERSE SHOULDER ARTHROPLASTY Right 12/07/2016   REVERSE SHOULDER ARTHROPLASTY Right 12/07/2016   Procedure: REVERSE SHOULDER ARTHROPLASTY;  Surgeon: Beverely Low, MD;  Location: [facility] OR;  Service: Orthopedics;  Laterality: Right;   ROTATOR CUFF REPAIR Right 2003   SFA Right 05/14/2014   PTA  OF RT SFA         DR BERRY   Family History  Problem Relation Age of Onset   Heart disease Mother    Leukemia Mother    Stomach cancer Father    Esophageal cancer Brother    Liver disease Brother    Alcoholism Brother    Leukemia Brother    Leukemia Brother    Diabetes type II Brother    Lung disease Sister    Social History:  reports that he quit smoking about 35 years ago. He started smoking about 60 years ago. He has never used smokeless tobacco. He reports current alcohol use. He  reports that he does not use drugs. Allergies:  Allergies  Allergen Reactions   Asa [Aspirin] Other (See Comments)    GI bleeding   Gadolinium Derivatives Itching, Swelling and Other (See Comments)    Pt complained of face flushing/hottness and throat tightness/scratchiness immediately after the injections.  Within 4 minutes, all symptoms were gone and the study was completed.  No further complications or signs of allergy were exhibited after completion of study.    Iodinated Contrast Media Other (See Comments)    Pt does not recall reaction   Pneumococcal 13-Val Conj Vacc     Other Reaction(s): achiness all over, dizziness, nausea, weakness   Scopolamine     Other Reaction(s): Delerium   Oxycodone-Acetaminophen Other (See Comments)    Dizziness and feeling of being uncomfortable    Medications Prior to Admission  Medication Sig Dispense Refill   clopidogrel (PLAVIX) 75 MG tablet TAKE 1 TABLET DAILY (Patient taking differently: Take 75 mg by mouth every evening.) 90 tablet 3   DULoxetine (CYMBALTA) 20 MG capsule  Take 20 mg by mouth daily.     ferrous sulfate 325 (65 FE) MG tablet Take 325 mg by mouth daily.     JARDIANCE 10 MG TABS tablet Oral for 90 Days     lisinopril (ZESTRIL) 5 MG tablet Take 5 mg by mouth 2 (two) times daily.     metoprolol succinate (TOPROL-XL) 25 MG 24 hr tablet Take 25 mg by mouth 2 (two) times daily.     MYRBETRIQ 50 MG TB24 tablet Take 50 mg by mouth daily.     pantoprazole (PROTONIX) 40 MG tablet Take 1 tablet (40 mg total) by mouth 2 (two) times daily. 180 tablet 3   rosuvastatin (CRESTOR) 20 MG tablet Take 20 mg by mouth every evening.      UNABLE TO FIND Take 1 tablet by mouth daily as needed (solidify bowel movements). Med Name: [place]      Home: Home Living Family/patient expects to be discharged to:: Private residence Living Arrangements: Spouse/significant other Available Help at Discharge: Family, Available 24 hours/day Type of Home: House Home Access: Stairs to enter Secretary/administrator of Steps: 3 Entrance Stairs-Rails: Left Home Layout: One level Bathroom Shower/Tub: Health visitor: Handicapped height Bathroom Accessibility: Yes Home Equipment: Agricultural consultant (2 wheels), Rollator (4 wheels), Shower seat - built in, Mudlogger: Sock aid, Reacher  Functional History: Prior Function Prior Level of Function : Needs assist, History of Falls (last six months) Mobility Comments: pt using RW in home, rollator in community. sideways on stairs for BUE support on rail. reports sit-stand can take 5-6 tries ADLs Comments: wife recently has helped with sponge bath, typically pt gets in shower. Pt recently fell putting on underwear while standing. Pt reports use of sock aid to donn socks Functional Status:  Mobility: Bed Mobility Overal bed mobility: Needs Assistance Bed Mobility: Rolling, Sidelying to Sit Rolling: Mod assist Sidelying to sit: +2 for physical assistance, Mod assist, Used rails Sit to sidelying: +2  for physical assistance, Mod assist General bed mobility comments: pt in chair Transfers Overall transfer level: Needs assistance Equipment used: Rolling walker (2 wheels) Transfers: Sit to/from Stand Sit to Stand: Mod assist Transfer via Lift Equipment: [employer] transfer comment: bilateral support given at gait belt and shoulder girdle; stood x 5 from higher stedy seat, and stood x2 from lwer recliner weat Ambulation/Gait Ambulation/Gait assistance: Mod assist, +2 physical assistance, +2 safety/equipment Gait Distance (Feet): 25 Feet  Assistive device: Rolling walker (2 wheels) Gait Pattern/deviations: Step-through pattern, Decreased step length - right, Decreased step length - left, Ataxic General Gait Details: Unsteady throughout with heavy dependence on UE support; step width varying singificantly, with the need for near constant support; occasional siciisoring    ADL: ADL Overall ADL's : Needs assistance/impaired Eating/Feeding: Set up, Sitting Grooming: Wash/dry hands, Wash/dry face, Supervision/safety, Sitting Upper Body Bathing: Minimal assistance, Sitting Lower Body Bathing: Maximal assistance, Sitting/lateral leans Lower Body Bathing Details (indicate cue type and reason): simulated Upper Body Dressing : Minimal assistance, Sitting Lower Body Dressing: Maximal assistance, Total assistance, Sit to/from stand, Sitting/lateral leans Toilet Transfer: Moderate assistance, Rolling walker (2 wheels), Stand-pivot, BSC/3in1 Toilet Transfer Details (indicate cue type and reason): simulated with sit<>stand Toileting- Clothing Manipulation and Hygiene: Total assistance, Sitting/lateral lean, Sit to/from stand Toileting - Clothing Manipulation Details (indicate cue type and reason): unable to complete in standing Functional mobility during ADLs: Maximal assistance, Moderate assistance, +2 for physical assistance, Cueing for safety, Cueing for sequencing General ADL Comments: Prior to  this admission, patient living with his wife, and able to walk with a RW. Patient endorses that he has required significantly more assist with all aspects of care lately. Currently patient  is +2 mod assist supine <> sit, and +2 min/HHA sit to stand. Patient with poor sitting balance, and unable to transition to steps despite increased assist. During transition sit to sidelying, pt reporting an 'electric shock' down BLE. In bed, pt only tolerating HOB up to 10 degrees. Patient is currently mod to max A for ADL management. Given current level of function, OT is recommending rehab <  3 hours prior to return to home. If patient is able to return home, patient would require increased DME (see below and PT recommendation). OT will continue to follow acutely.  Cognition: Cognition Orientation Level: Oriented to person, Oriented to situation, Oriented to place, Disoriented to time Cognition Arousal: Alert Behavior During Therapy: [facility] for tasks assessed/performed  Blood pressure 129/68, pulse (!) 110, temperature 98.5 F (36.9 C), temperature source Oral, resp. rate 18, height 5\' 5"  (1.651 m), weight 65 kg, SpO2 92%. Physical Exam Vitals and nursing note reviewed.  Constitutional:      Comments: Pt supine in bed appears confused; and swaying head back and forth- restless- asking frequently who I was, no acute distress this AM; 2nd time seen appeared agitated and actively hallucinating   HENT:     Head: Normocephalic and atraumatic.     Nose: Nose normal. No congestion.     Mouth/Throat:     Mouth: Mucous membranes are dry.  Eyes:     General:        Right eye: No discharge.        Left eye: No discharge.  Neck:     Comments: In soft collar- turning head back and forth Cardiovascular:     Rate and Rhythm: Regular rhythm. Tachycardia present.     Heart sounds: Normal heart sounds. No murmur heard.    No gallop.  Pulmonary:     Effort: Pulmonary effort is normal. No respiratory distress.      Breath sounds: Normal breath sounds. No wheezing, rhonchi or rales.  Abdominal:     Palpations: Abdomen is soft.     Comments: Distended vs slightly protuberant- hypoactive BS, NT  Genitourinary:    Comments: Condom cath with medium to darker amber urine in bag Musculoskeletal:     Comments: Hard to assess due to delirium- and  perseveration, then stopped continually to participate in exam RUE- appears to be 4 to 4+/5 throughout LUE- 4+/5 except Triceps 4-/5 and FA 4/5 LE's- HF 2-/5; KE 3-/5; DF 3-/5 and PF 4-/5- poor participation  Skin:    General: Skin is warm and dry.  Neurological:     Comments: Confused Thinks initially was at home in [place] with wife  2nd visit, couldn't identify wife by face and had no idea here was here- was actively hallucinating and pointing at things Sustained clonus LLE and 5 beats on LLE hoffmans brisk B/L Fidgeting and RUE appears to have a tremor- at rest No increased tone felt in Ue's were 3-4 MAS B/L   Psychiatric:     Comments: See neuro     Results for orders placed or performed during the hospital encounter of 10/02/23 (from the past 24 hours)  Glucose, capillary     Status: Abnormal   Collection Time: 10/04/23  4:19 PM  Result Value Ref Range   Glucose-Capillary 114 (H) 70 - 99 mg/dL  Glucose, capillary     Status: Abnormal   Collection Time: 10/04/23  9:21 PM  Result Value Ref Range   Glucose-Capillary 159 (H) 70 - 99 mg/dL  Glucose, capillary     Status: Abnormal   Collection Time: 10/05/23  7:18 AM  Result Value Ref Range   Glucose-Capillary 165 (H) 70 - 99 mg/dL  Glucose, capillary     Status: Abnormal   Collection Time: 10/05/23 11:45 AM  Result Value Ref Range   Glucose-Capillary 172 (H) 70 - 99 mg/dL  CBC     Status: Abnormal   Collection Time: 10/05/23  2:30 PM  Result Value Ref Range   WBC 19.1 (H) 4.0 - 10.5 K/uL   RBC 4.04 (L) 4.22 - 5.81 MIL/uL   Hemoglobin 11.8 (L) 13.0 - 17.0 g/dL   HCT 28.4 (L) 13.2 - 44.0 %    MCV 85.6 80.0 - 100.0 fL   MCH 29.2 26.0 - 34.0 pg   MCHC 34.1 30.0 - 36.0 g/dL   RDW 10.2 72.5 - 36.6 %   Platelets 303 150 - 400 K/uL   nRBC 0.0 0.0 - 0.2 %   No results found.   Assessment/Plan: Diagnosis: Incomplete quadriplegia- chronic cervical myelopathy made worse after fall at home- with Severe spasticity Does the need for close, 24 hr/day medical supervision in concert with the patient's rehab needs make it unreasonable for this patient to be served in a less intensive setting? Yes Co-Morbidities requiring supervision/potential complications: Severe quadriplegia- needing surgical intervention if at all possible- Spasticity- severe; hallucinations/confusion, DM; PAD, dCHF; CKD4- HTN, HLD, GERD, depression Due to bladder management, bowel management, safety, skin/wound care, disease management, medication administration, pain management, and patient education, does the patient require 24 hr/day rehab nursing? Yes Does the patient require coordinated care of a physician, rehab nurse, therapy disciplines of PT, OT and SLP to address physical and functional deficits in the context of the above medical diagnosis(es)? Yes Addressing deficits in the following areas: balance, endurance, locomotion, strength, transferring, bowel/bladder control, bathing, dressing, feeding, grooming, toileting, and cognition Can the patient actively participate in an intensive therapy program of at least 3 hrs of therapy per day at least 5 days per week? Yes The potential for patient to make measurable gains while on inpatient rehab is good and fair Anticipated functional outcomes upon discharge from inpatient rehab are supervision and min assist  with PT, supervision and min assist with OT, min  assist with SLP. Estimated rehab length of stay to reach the above functional goals is: 3-4 weeks Anticipated discharge destination: Home Overall Rehab/Functional Prognosis: good and fair  RECOMMENDATIONS: This  patient's condition is appropriate for continued rehabilitative care in the following setting: CIR eventually, but needs surgical intervention first.  Patient has agreed to participate in recommended program. Potentially Note that insurance prior authorization may be required for reimbursement for recommended care.  Comment:  Pt is actively hallucinating and delirious- his HR was running low 100's when saw this AM, however now per chart is 130's 140's- I'm concerned that he has UTI or impending sepsis due to his severe delirium that was worse when I stopped by this afternoon, after seeing him this AM. He was not able to tell who his wife was this afternoon until we repeated her name multiple times- he was constantly saying that he was at home and needed to go to work, and mumbling hallucinations.  Please check U/A and Cx- and do bladder scans of bladder to see if retaining- if is; then place foley or do in/out caths every 4-6 hours Patient not drinking- suggest IVFs if his output doesn't exceed 1500 ml/day or 500 cc in 8 hours.  Once pt's delirum is resolved, suggest Dantrolene for his spasticity- cannot use Baclofen due to his CKD4 and Zanaflex or Valium would cause confusion/possibly delirium-- so Dantrolene which causes less sedation since works in muscle, not brain usually has no sedation (<3% of patients have sedation) and not usually confusion- but wait to start until delirium resolved. Dantrolene start at 50 mg at bedtime x 5 days, then BID x 5 days, and by then, should be in CIR D/w Dr Danielle Dess about my concerns with pt's MRI and need for surgical intervention- he will see pt/ Also went back and spoke with pt's wife about my concerns of Spinal cord compression and need for intervention if surgery agrees (due to his co-morbidities) answered her questions about [place] compression as well as weakness and spasticity- also we discussed that he's hallucinated like this in past, when had cardiac surgery? We  will continue to follow remotely to see when time for pt to come to CIR.  Needs intervention to have BM- LBM 4/4- suggest Sorbitol (since SCI can cause severe constipation and slowing of gut down 50+% actually)- if that doesn't work, do suppository or enema to clean out and then will see if needs additional intervention.  9. Called Primary physician about delirium 10. D/w Admissions coordinator to follow remotely.   I spent a total of 96   minutes on total care today- >50% coordination of care- due to  Review of chart, seeing and interviewing pt twice, speaking with Dr Danielle Dess, primary doctor twice; and wife x1 as well as admissions coordinator  Genice Rouge, MD 10/05/2023

## 2023-10-05 NOTE — Consult Note (Signed)
 Reason for Consult: Spondylitic myelopathy Referring Physician: [name] is an 83 y.o. male.  HPI: Patient is an 83 year old individual who apparently had a fall from standing height.  Patient was noted to be significantly weak in his extremities he is complaining of significant pain and has been medicated to the point where he is severely confused and confabulating.  Nonetheless an MRI was performed which demonstrates that the patient has high-grade stenosis at the level of C5-6 and C6-C7 with cord compression of both these levels.  Patient has had some posterior interspinous injury in the ligaments from C2-C5.  He has been evaluated by rehabilitation service and consultation was requested to see if the patient would be a surgical candidate.  Patient has significant comorbidities of Christian Bailey kidney disease severe peripheral vascular disease and diabetes.  I contacted the patient's wife who notes that his ambulatory status has deteriorated particularly severely in the last 6 months after he had had another fall back in October.  Since that time he has required the use of a walker to ambulate.  Most recent fall from a standing height has worsened his condition Christian Bailey is having significant dysesthesias in his hands and difficulty walking or moving his legs.  He has not been ambulatory since in the hospital.  Past Medical History:  Diagnosis Date   Anemia    Complication of anesthesia    Diabetes mellitus (HCC)    TYPE 2   GERD (gastroesophageal reflux disease)    History of blood transfusion    GI bleed   History of colon polyps    History of hiatal hernia    Hyperlipemia    Hypertension    Osteoarthritis    PAD (peripheral artery disease) (HCC)    a. stenting of his left common iliac artery >20 years ago. b. h/o LEIA stent and 2 stents to R SFA in 2011. c. 04/2014:  s/p PTA of right SFA for in-stent restenosis, occluded left SFA   PONV (postoperative nausea and vomiting)     no porblem with the last 3 surgeries   RBBB (right bundle branch block with left anterior fascicular block)    NUCLEAR STRESS TEST, 08/18/2010 - no significant wall motion abnoramlities noted, post-stress EF 69%, normal myocardial perfusion study   Sinus tachycardia    a. Noted during admission 04/2014 but upon review seems to be frequent finding for patient.   Stenosis of carotid artery    a. 50% right carotid stenosis by angiogram 04/2014.    Past Surgical History:  Procedure Laterality Date   ABDOMINAL AORTOGRAM W/LOWER EXTREMITY N/A 01/27/2017   Procedure: Abdominal Aortogram w/Lower Extremity;  Surgeon: Nada Libman, MD;  Location: [facility];  Service: Cardiovascular;  Laterality: N/A;   ABDOMINAL AORTOGRAM W/LOWER EXTREMITY N/A 06/08/2017   Procedure: ABDOMINAL AORTOGRAM W/LOWER EXTREMITY;  Surgeon: Nada Libman, MD;  Location: [facility];  Service: Cardiovascular;  Laterality: N/A;   ABDOMINAL AORTOGRAM W/LOWER EXTREMITY N/A 08/31/2017   Procedure: ABDOMINAL AORTOGRAM W/LOWER EXTREMITY;  Surgeon: Nada Libman, MD;  Location: [facility];  Service: Cardiovascular;  Laterality: N/A;  rt. unilateral   ANGIOPLASTY / STENTING FEMORAL     ANGIOPLASTY / STENTING ILIAC     AORTA - BILATERAL FEMORAL ARTERY BYPASS GRAFT N/A 07/06/2018   Procedure: AORTA BIFEMORAL BYPASS GRAFT USING 14X7MM X 40CM HEMASHIELD GOLD GRAFT;  Surgeon: Nada Libman, MD;  Location: [facility];  Service: Vascular;  Laterality: N/A;  CEREBRAL ANGIOGRAM N/A 05/14/2014   Procedure: CEREBRAL ANGIOGRAM;  Surgeon: Runell Gess, MD;  Location: [facility] CATH LAB;  Service: Cardiovascular;  Laterality: N/A;   COLONOSCOPY W/ POLYPECTOMY     ENDARTERECTOMY Right 09/08/2017   ENDARTERECTOMY FEMORAL Right 09/08/2017   Procedure: REDO RIGHT FEMORAL ENDARTECTOMY WITH PATCH ANGIOPLASTY.;  Surgeon: Nada Libman, MD;  Location: [facility] OR;  Service: Vascular;  Laterality: Right;   EYE SURGERY Bilateral     cataract   FEMORAL ARTERY STENT Right 05/12/2010   Stented distally with a 6x100 Abbott absolute stent and proximally with a 6x60 Cook Zilver stent resulting in the reduction of the proximal segment 80% and mid segment 60-70% to 0% residual, LEFT common femoral artery stented with a 7x3 Smart stent resulting in reduction of 90% stenosis to 0% residual   FEMORAL-POPLITEAL BYPASS GRAFT Right 09/18/2016   Procedure: BYPASS GRAFT FEMORAL-POPLITEAL ARTERY;  Surgeon: Nada Libman, MD;  Location: [facility];  Service: Vascular;  Laterality: Right;   FEMORAL-POPLITEAL BYPASS GRAFT Right 09/08/2017   Procedure: REDO BYPASS GRAFT FEMORAL-POPLITEAL ARTERY;  Surgeon: Nada Libman, MD;  Location: [facility];  Service: Vascular;  Laterality: Right;   FEMORAL-POPLITEAL BYPASS GRAFT Right 07/06/2018   Procedure: REVISION RIGHT FEMORAL TO POPLITEAL ARTERY BYPASS GRAFT;  Surgeon: Nada Libman, MD;  Location: [facility];  Service: Vascular;  Laterality: Right;   IR CHOLANGIOGRAM EXISTING TUBE  08/04/2018   IR EXCHANGE BILIARY DRAIN  01/22/2019   IR PERC CHOLECYSTOSTOMY  07/19/2018   IR RADIOLOGIST EVAL & MGMT  08/31/2018   IR RADIOLOGIST EVAL & MGMT  01/17/2019   IR RADIOLOGIST EVAL & MGMT  02/01/2019   IR RADIOLOGIST EVAL & MGMT  02/08/2019   KNEE ARTHROSCOPY     left   LOWER EXTREMITY ANGIOGRAM N/A 05/14/2014   Procedure: LOWER EXTREMITY ANGIOGRAM;  Surgeon: Runell Gess, MD;  [facility];  Service: Cardiovascular;  Laterality: N/A;   LOWER EXTREMITY ANGIOGRAPHY N/A 09/21/2016   Procedure: Lower Extremity Angiography;  Surgeon: Fransisco Hertz, MD;  Location: [facility] INVASIVE CV LAB;  Service: Cardiovascular;  Laterality: N/A;   PERIPHERAL VASCULAR ATHERECTOMY Right 01/27/2017   Procedure: PERIPHERAL VASCULAR ATHERECTOMY;  Surgeon: Nada Libman, MD;  [facility]: [facility];  Service: Cardiovascular;  Laterality: Right;   PERIPHERAL VASCULAR BALLOON ANGIOPLASTY Right 06/08/2017   Procedure: PERIPHERAL VASCULAR  BALLOON ANGIOPLASTY;  Surgeon: Nada Libman, MD;  Location: [facility];  Service: Cardiovascular;  Laterality: Right;  common femoral and superficial femoral arteries   PERIPHERAL VASCULAR CATHETERIZATION N/A 04/20/2016   Procedure: Lower Extremity Intervention;  Surgeon: Runell Gess, MD;  Location: [facility] INVASIVE CV LAB;  Service: Cardiovascular;  Laterality: N/A;   PERIPHERAL VASCULAR INTERVENTION  08/31/2017   Procedure: PERIPHERAL VASCULAR INTERVENTION;  Surgeon: Nada Libman, MD;  Location: [facility];  Service: Cardiovascular;;  REIA   REVERSE SHOULDER ARTHROPLASTY Right 12/07/2016   REVERSE SHOULDER ARTHROPLASTY Right 12/07/2016   Procedure: REVERSE SHOULDER ARTHROPLASTY;  Surgeon: Beverely Low, MD;  Location: [facility] OR;  Service: Orthopedics;  Laterality: Right;   ROTATOR CUFF REPAIR Right 2003   SFA Right 05/14/2014   PTA  OF RT SFA         DR BERRY    Family History  Problem Relation Age of Onset   Heart disease Mother    Leukemia Mother    Stomach cancer Father    Esophageal cancer Brother    Liver disease Brother  Alcoholism Brother    Leukemia Brother    Leukemia Brother    Diabetes type II Brother    Lung disease Sister     Social History:  reports that he quit smoking about 35 years ago. He started smoking about 60 years ago. He has never used smokeless tobacco. He reports current alcohol use. He reports that he does not use drugs.  Allergies:  Allergies  Allergen Reactions   Asa [Aspirin] Other (See Comments)    GI bleeding   Gadolinium Derivatives Itching, Swelling and Other (See Comments)    Pt complained of face flushing/hottness and throat tightness/scratchiness immediately after the injections.  Within 4 minutes, all symptoms were gone and the study was completed.  No further complications or signs of allergy were exhibited after completion of study.    Iodinated Contrast Media Other (See Comments)    Pt does not recall reaction    Pneumococcal 13-Val Conj Vacc     Other Reaction(s): achiness all over, dizziness, nausea, weakness   Scopolamine     Other Reaction(s): Delerium   Oxycodone-Acetaminophen Other (See Comments)    Dizziness and feeling of being uncomfortable     Medications: I have reviewed the patient's current medications.  Results for orders placed or performed during the hospital encounter of 10/02/23 (from the past 48 hours)  Glucose, capillary     Status: Abnormal   Collection Time: 10/03/23  4:18 PM  Result Value Ref Range   Glucose-Capillary 142 (H) 70 - 99 mg/dL    Comment: Glucose reference range applies only to samples taken after fasting for at least 8 hours.  Glucose, capillary     Status: Abnormal   Collection Time: 10/03/23  9:08 PM  Result Value Ref Range   Glucose-Capillary 120 (H) 70 - 99 mg/dL    Comment: Glucose reference range applies only to samples taken after fasting for at least 8 hours.  Glucose, capillary     Status: Abnormal   Collection Time: 10/04/23  6:23 AM  Result Value Ref Range   Glucose-Capillary 108 (H) 70 - 99 mg/dL    Comment: Glucose reference range applies only to samples taken after fasting for at least 8 hours.  Glucose, capillary     Status: Abnormal   Collection Time: 10/04/23 11:28 AM  Result Value Ref Range   Glucose-Capillary 145 (H) 70 - 99 mg/dL    Comment: Glucose reference range applies only to samples taken after fasting for at least 8 hours.  Glucose, capillary     Status: Abnormal   Collection Time: 10/04/23  4:19 PM  Result Value Ref Range   Glucose-Capillary 114 (H) 70 - 99 mg/dL    Comment: Glucose reference range applies only to samples taken after fasting for at least 8 hours.  Glucose, capillary     Status: Abnormal   Collection Time: 10/04/23  9:21 PM  Result Value Ref Range   Glucose-Capillary 159 (H) 70 - 99 mg/dL    Comment: Glucose reference range applies only to samples taken after fasting for at least 8 hours.  Glucose,  capillary     Status: Abnormal   Collection Time: 10/05/23  7:18 AM  Result Value Ref Range   Glucose-Capillary 165 (H) 70 - 99 mg/dL    Comment: Glucose reference range applies only to samples taken after fasting for at least 8 hours.    No results found.  Review of Systems  Constitutional:  Positive for activity change.  Musculoskeletal:  Positive  for back pain, gait problem, myalgias and neck pain.  Neurological:  Positive for dizziness, weakness and light-headedness.   Blood pressure 126/79, pulse (!) 110, temperature (!) 97.4 F (36.3 C), temperature source Oral, resp. rate 19, height 5\' 5"  (1.651 m), weight 65 kg, SpO2 92%. Physical Exam Constitutional:      Comments: Patient is alert but confused and confabulates he is reticent to move his extremities but has marked increase in tone in the upper and lower extremities.  He will raise the left leg off the bed when asked to do so he has some difficulty raising the right leg off the bed.  He has spontaneous clonus in the ankles bilaterally.     Assessment/Plan: Severe spondylitic cord compression at C5-6 and C6-C7.  There is ossification of the posterior longitudinal ligament.  From a surgical perspective this would require a corpectomy and discectomy at C5-6 and C6-C7 with anterior plating.  This is a substantial surgery for patient who has multiple comorbidities.  He has been placed on Plavix for his peripheral vascular disease.  This will need to be stopped.  Certainly whether surgery should be considered require some discussion as warranting whether the patient would be substantially improved and is very difficult to say.  At the current time I have advised that I will follow along to see how his enteral status clears.  If there is significant improvement the patient can participate in his own decision whether he would like to proceed with surgical intervention.  Shary Key Christian Bailey 10/05/2023, 11:39 AM

## 2023-10-05 NOTE — Plan of Care (Signed)
  Problem: Coping: Goal: Ability to adjust to condition or change in health will improve Outcome: Not Progressing

## 2023-10-05 NOTE — Plan of Care (Signed)

## 2023-10-05 NOTE — Plan of Care (Signed)
  Problem: Fluid Volume: Goal: Ability to maintain a balanced intake and output will improve Outcome: Not Progressing   Problem: Nutritional: Goal: Maintenance of adequate nutrition will improve Outcome: Not Progressing   Problem: Education: Goal: Knowledge of General Education information will improve Description: Including pain rating scale, medication(s)/side effects and non-pharmacologic comfort measures Outcome: Not Progressing   Problem: Activity: Goal: Risk for activity intolerance will decrease Outcome: Not Progressing   Problem: Nutrition: Goal: Adequate nutrition will be maintained Outcome: Not Progressing

## 2023-10-05 NOTE — Progress Notes (Addendum)
 PROGRESS NOTE    Christian Bailey  ZOX:[id number] DOB: [birth year] DOA: 10/02/2023 PCP: Chilton Greathouse, MD   Brief Narrative:  Christian Bailey is a 83 y.o. male with medical history significant for peripheral artery disease on Plavix and Crestor, RBBB, chronic HFpEF, CKD 4, ambulatory dysfunction (uses a walker at baseline), carotid stenosis, hypertension, hyperlipidemia, type 2 diabetes, chronic anxiety/depression, iron deficiency anemia, GERD, presents to the ER after a fall.   Patient has what appears to be acute on chronic ambulatory dysfunction with fall, and intake patient's imaging was unremarkable for any acute fracture, imaging did indicate posterior spinous ligament injury from C2-C5 as well as diffuse muscle strain in the area.  No other acute findings.  Neurosurgery consulted recommending soft collar, hospitalist called for admission due to ongoing ambulatory dysfunction.  Patient improving but continues to have profound pain and difficulty walking - PT recommending evaluation for inpatient rehab. Patient and wife agreeable.  Assessment & Plan:   Principal Problem:   Fall Active Problems:   Intractable back pain  Acute encephalopathy, unclear etiology  Rule out acute infection  Rule out polypharmacy -Mental status changes initially attributed to polypharmacy, wife confirms patient poorly tolerates narcotics and routinely hallucinates with high-dose narcotics.  Patient has been on low-dose and currently has not received narcotics now in over 24 hours. -CT head pending Radiology noted a small intraventricular and subarachnoid hemorrhage bilaterally - Neuro surgery aware - appreciate insight and recommendations -CBC CMP urine/blood cultures pending  Acute on chronic ambulatory dysfunction Mechanical fall with trauma -Imaging as above negative for fracture but notable for spinous ligament injury/tear. -Neurosurgery to reevaluate today given ongoing symptoms: spasm and  clonus -Patient's ambulatory status continues to be limited by pain(see below) -PT OT following along  Acute intractable pain Peripheral arterial disease -Currently somewhat poorly controlled in the setting of patient's mental status changes unclear to evaluate his pain level, would continue tramadol, low-dose oxycodone Flexeril and gabapentin.  -If no clear infectious process or lab findings above to explain mental status changes would wean patient's pain medication in the next 24 to 48 hours  CKD 4 baseline -patient does not meet criteria for AKI given baseline - continue to increase p.o. intake as appropriate, Restart IV fluids given poor p.o. intake over the past 24 hours  Anxiety/depression-continue Cymbalta Hypertension-currently holding metoprolol, lisinopril given above -resume as appropriate Diabetes type 2 uncontrolled with hyperglycemia -A1c 8.0, continue sliding scale insulin hypoglycemic protocol HFpEF, without acute exacerbation -not on diuretics, hold metoprolol in the setting of hypotension  DVT prophylaxis: SCDs Start: 10/02/23 2244 Code Status:   Code Status: Full Code Family Communication: Wife updated at bedside  Status is: Inpatient  Dispo: The patient is from: Home              Anticipated d/c is to: To be determined              Anticipated d/c date is: 24 to 48 hours              Patient currently not medically stable for discharge in the setting of intractable pain and ambulatory dysfunction  Consultants:  Neurosurgery,   Procedures:  None  Antimicrobials:  None indicated  Subjective: Patient has continued to deteriorate over the past 24 hours, unable to orient today review of systems markedly limited.  Patient unable to follow commands appropriately.  Objective: Vitals:   10/04/23 1420 10/04/23 2019 10/05/23 0525 10/05/23 0720  BP: (!) 147/67 (!) 151/72 (!) 161/75 126/79  Pulse:  (!) 109 (!) 110   Resp: 18  18 19   Temp: (!) 97.5 F (36.4 C) 98  F (36.7 C) 97.7 F (36.5 C) (!) 97.4 F (36.3 C)  TempSrc: Oral Oral Oral Oral  SpO2: 95% 96% 94% 92%  Weight:      Height:        Intake/Output Summary (Last 24 hours) at 10/05/2023 1420 Last data filed at 10/05/2023 0900 Gross per 24 hour  Intake 600 ml  Output 1300 ml  Net -700 ml   Filed Weights   10/02/23 1509  Weight: 65 kg    Examination:  General: Appears uncomfortable sitting up in bed, unable to orient, notable word salad or asking questions that are nonsensical Neck: Soft collar noted - patient able to nod/shake head mildly with no pain Lungs:  Clear to auscultate bilaterally without rhonchi, wheeze, or rales. Heart: Tachycardic without murmur rubs or gallops Abdomen:  Soft, nontender, nondistended.  Without guarding or rebound. Extremities: Without cyanosis, clubbing, edema. RUE ROM limited by pain Skin:  Warm and dry, no erythema.  Data Reviewed: I have personally reviewed following labs and imaging studies  CBC: Recent Labs  Lab 10/02/23 1451 10/02/23 1522 10/03/23 0558  WBC 12.0*  --  10.2  NEUTROABS 9.4*  --   --   HGB 10.6* 10.5* 10.6*  HCT 33.3* 31.0* 31.4*  MCV 90.7  --  87.2  PLT 306  --  276   Basic Metabolic Panel: Recent Labs  Lab 10/02/23 1451 10/02/23 1522 10/03/23 0558  NA 140 140 142  K 4.5 4.5 4.5  CL 105 109 108  CO2 21*  --  24  GLUCOSE 193* 191* 121*  BUN 43* 42* 38*  CREATININE 2.60* 2.80* 2.23*  CALCIUM 9.3  --  9.2  MG  --   --  1.9  PHOS  --   --  3.8   GFR: Estimated Creatinine Clearance: 22.2 mL/min (A) (by C-G formula based on SCr of 2.23 mg/dL (H)).  Liver Function Tests: Recent Labs  Lab 10/02/23 1451  AST 23  ALT 19  ALKPHOS 71  BILITOT 0.5  PROT 7.3  ALBUMIN 3.7    HbA1C: Recent Labs    10/02/23 1451  HGBA1C 8.0*   CBG: Recent Labs  Lab 10/04/23 1128 10/04/23 1619 10/04/23 2121 10/05/23 0718 10/05/23 1145  GLUCAP 145* 114* 159* 165* 172*   No results found for this or any previous  visit (from the past 240 hours).   Radiology Studies: No results found.  Scheduled Meds:  DULoxetine  20 mg Oral Daily   feeding supplement  237 mL Oral BID BM   ferrous sulfate  325 mg Oral Q breakfast   insulin aspart  0-5 Units Subcutaneous QHS   insulin aspart  0-9 Units Subcutaneous TID WC   mirabegron ER  50 mg Oral Daily   pantoprazole  40 mg Oral BID   pregabalin  50 mg Oral BID   rosuvastatin  20 mg Oral QPM   Continuous Infusions:     LOS: 2 days   Time spent:  Azucena Fallen, DO [facility]  If 7PM-7AM, please contact night-coverage [email or web address]  10/05/2023, 2:20 PM

## 2023-10-06 DIAGNOSIS — M549 Dorsalgia, unspecified: Secondary | ICD-10-CM

## 2023-10-06 LAB — GLUCOSE, CAPILLARY
Glucose-Capillary: 111 mg/dL — ABNORMAL HIGH (ref 70–99)
Glucose-Capillary: 159 mg/dL — ABNORMAL HIGH (ref 70–99)
Glucose-Capillary: 183 mg/dL — ABNORMAL HIGH (ref 70–99)
Glucose-Capillary: 188 mg/dL — ABNORMAL HIGH (ref 70–99)
Glucose-Capillary: 189 mg/dL — ABNORMAL HIGH (ref 70–99)

## 2023-10-06 LAB — URINALYSIS, W/ REFLEX TO CULTURE (INFECTION SUSPECTED)
Bilirubin Urine: NEGATIVE
Glucose, UA: >=500 mg/dL — AB
Hgb urine dipstick: NEGATIVE
Ketones, ur: NEGATIVE mg/dL
Leukocytes,Ua: NEGATIVE
Nitrite: NEGATIVE
Protein, ur: NEGATIVE mg/dL
Specific Gravity, Urine: 1.013 (ref 1.005–1.030)
pH: 5 (ref 5.0–8.0)

## 2023-10-06 LAB — CBC
HCT: 29.8 % — ABNORMAL LOW (ref 39.0–52.0)
Hemoglobin: 10 g/dL — ABNORMAL LOW (ref 13.0–17.0)
MCH: 29.2 pg (ref 26.0–34.0)
MCHC: 33.6 g/dL (ref 30.0–36.0)
MCV: 87.1 fL (ref 80.0–100.0)
Platelets: 271 10*3/uL (ref 150–400)
RBC: 3.42 MIL/uL — ABNORMAL LOW (ref 4.22–5.81)
RDW: 13.7 % (ref 11.5–15.5)
WBC: 14.2 10*3/uL — ABNORMAL HIGH (ref 4.0–10.5)
nRBC: 0 % (ref 0.0–0.2)

## 2023-10-06 LAB — COMPREHENSIVE METABOLIC PANEL WITH GFR
ALT: 12 U/L (ref 0–44)
AST: 16 U/L (ref 15–41)
Albumin: 2.8 g/dL — ABNORMAL LOW (ref 3.5–5.0)
Alkaline Phosphatase: 54 U/L (ref 38–126)
Anion gap: 11 (ref 5–15)
BUN: 46 mg/dL — ABNORMAL HIGH (ref 8–23)
CO2: 23 mmol/L (ref 22–32)
Calcium: 8.5 mg/dL — ABNORMAL LOW (ref 8.9–10.3)
Chloride: 103 mmol/L (ref 98–111)
Creatinine, Ser: 2.43 mg/dL — ABNORMAL HIGH (ref 0.61–1.24)
GFR, Estimated: 26 mL/min — ABNORMAL LOW (ref >60)
Glucose, Bld: 124 mg/dL — ABNORMAL HIGH (ref 70–99)
Potassium: 4.3 mmol/L (ref 3.5–5.1)
Sodium: 137 mmol/L (ref 135–145)
Total Bilirubin: 0.7 mg/dL (ref 0.0–1.2)
Total Protein: 6.3 g/dL — ABNORMAL LOW (ref 6.5–8.1)

## 2023-10-06 MED ORDER — HYDROMORPHONE HCL 1 MG/ML IJ SOLN
0.2500 mg | Freq: Once | INTRAMUSCULAR | Status: AC
  Administered 2023-10-06: 0.25 mg via INTRAVENOUS
  Filled 2023-10-06: qty 0.5

## 2023-10-06 MED ORDER — ACETAMINOPHEN 500 MG PO TABS
1000.0000 mg | ORAL_TABLET | Freq: Four times a day (QID) | ORAL | Status: DC
  Administered 2023-10-06 – 2023-10-18 (×37): 1000 mg via ORAL
  Filled 2023-10-06 (×41): qty 2

## 2023-10-06 MED ORDER — SODIUM CHLORIDE 0.9 % IV BOLUS
500.0000 mL | Freq: Once | INTRAVENOUS | Status: AC
  Administered 2023-10-06: 500 mL via INTRAVENOUS

## 2023-10-06 MED ORDER — AZITHROMYCIN 250 MG PO TABS
500.0000 mg | ORAL_TABLET | Freq: Every day | ORAL | Status: AC
  Administered 2023-10-06 – 2023-10-09 (×4): 500 mg via ORAL
  Filled 2023-10-06 (×4): qty 2

## 2023-10-06 MED ORDER — HYDROMORPHONE HCL 1 MG/ML IJ SOLN
0.2500 mg | INTRAMUSCULAR | Status: DC | PRN
  Administered 2023-10-06 – 2023-10-08 (×2): 0.25 mg via INTRAVENOUS
  Filled 2023-10-06 (×3): qty 0.5

## 2023-10-06 NOTE — Plan of Care (Signed)

## 2023-10-06 NOTE — Progress Notes (Signed)
 Pt A&O x 1. Lying comfortably on bed, denies any symptoms but visibly hallucinating auditory and visually.   10/06/23 1953  Assess: MEWS Score  Temp 98.5 F (36.9 C)  BP (!) 99/48  MAP (mmHg) (!) 55  Pulse Rate (!) 135  Resp 18  Level of Consciousness Alert  SpO2 93 %  O2 Device Room Air  Assess: MEWS Score  MEWS Temp 0  MEWS Systolic 1  MEWS Pulse 3  MEWS RR 0  MEWS LOC 0  MEWS Score 4  MEWS Score Color Red  Assess: if the MEWS score is Yellow or Red  Were vital signs accurate and taken at a resting state? Yes  Does the patient meet 2 or more of the SIRS criteria? No  MEWS guidelines implemented  Yes, red  Treat  MEWS Interventions Considered administering scheduled or prn medications/treatments as ordered  Take Vital Signs  Increase Vital Sign Frequency  Red: Q1hr x2, continue Q4hrs until patient remains green for 12hrs  Escalate  MEWS: Escalate Red: Discuss with charge nurse and notify provider. Consider notifying RRT. If remains red for 2 hours consider need for higher level of care  Notify: Charge Nurse/RN  Name of Charge Nurse/RN Notified Electrical engineer  Provider Notification  Provider Name/Title B. Evonnie Dawes  Date Provider Notified 10/06/23  Time Provider Notified 2045  Method of Notification Page  Notification Reason Change in status  Provider response Other (Comment)  Date of Provider Response 10/06/23  Time of Provider Response 2052  Assess: SIRS CRITERIA  SIRS Temperature  0  SIRS Respirations  0  SIRS Pulse 1  SIRS WBC 0  SIRS Score Sum  1

## 2023-10-06 NOTE — Progress Notes (Signed)
 Patient ID: Christian Bailey, male   DOB: 05/23/1941, 83 y.o.   MRN: [id number] Patient's vital signs are stable and he is awake and alert this morning.  He appears to have some insight into the fact that he may be hallucinating and his mentation seems much clearer today.  I was able to perform some exam determining that he has significant increase in tone and rigidity in his lower extremities and also in his upper extremities spontaneous clonus on the left side and clonus that is elicited on the right side to dorsiflexion however whether this represents true myelopathy or the effects of mild head injury as noted by the presence of a small amount of subarachnoid blood and intraventricular blood on the CT from yesterday remains to be determined.  His gait has been terrible for 6 months discussed with him the fact that he has some stenosis in his cervical spine and approach the idea of surgical decompression.  He is certainly not keen on any surgery at this time and I noted that this is something to consider carefully but is the one thing that may potentially help with his stability in his gait.  I also had a phone conversation with his wife indicating that he is clearer mentally this morning she will be by to visit him.  She will discuss with him the consideration of surgery.  I noted that given the advanced state of his apparent myelopathy and other comorbidities surgery does have substantial risk and the degree of benefit may be somewhat limited however given the fact that he has had significant deterioration here in the last 6 months and most recently with the fall that he is experienced at home I believe it is reasonable to consider surgical decompression and stabilization from C5-C7.  I will check with him and his wife tomorrow to see if he has arrived at his final decision regarding surgical intervention.  Also believe that now that he is somewhat clear if physical therapy can work with him in terms of  mobilization to better assess his stability.  If surgery is declined then ultimately issues of his ultimate discharge to home or facility need to be addressed.  It seems that he would be a handful for his wife to take care of on her own.

## 2023-10-06 NOTE — Progress Notes (Signed)
 PROGRESS NOTE    Christian Bailey  ZOX:[id number] DOB: [birth year] DOA: 10/02/2023 PCP: Chilton Greathouse, MD    Brief Narrative:  83 year old with history of peripheral artery disease on Plavix and Crestor, right bundle branch block, chronic diastolic heart failure, CKD stage IV, ambulatory dysfunction, hypertension hyperlipidemia type 2 diabetes, chronic anxiety and depression and GERD presented to the emergency room with a fall.  Patient reportedly with worsening mobility since last 6 months.  In the emergency room hemodynamically stable.  Imaging consistent with posterior spinous ligament injury from C2-C5.  Difficult to tolerate pain medications and getting delirious with narcotics.  Followed by neurosurgery.  Also with leukocytosis.  Subjective: Patient seen and examined.  Detailed conversation with wife at the bedside.  Reportedly after having very rough day yesterday and not tolerating opiates he has been more awake today.  Patient was able to keep up simple conversation. Discussed pain management as below. Neurosurgery weighing in whether he will benefit with cervical spine fusion.  Assessment & Plan:   Acute metabolic encephalopathy likely secondary to polypharmacy and narcotics: Mental status improving. Discontinue all long-acting narcotics. Tylenol 1 g every 6 hours around-the-clock. Dilaudid 0.25 mg every 3 hours as needed, trial was given in he was comfortable after taking Dilaudid. Continue to work with PT OT.  Soft collar. Neurosurgery following, operative versus conservative management.  Acute intractable pain, peripheral arterial disease: Continue Cymbalta, Tylenol 1 g every 6 hours, Lyrica 50 mg twice daily, Flexeril 5 mg 3 times daily as needed, will start Dilaudid 0.25 mg every 3 hours as needed.  Bowel regimen.  CKD stage IV: At about baseline.  Anxiety depression: On Cymbalta.  Leukocytosis: Patient presented with WBC count of 19,000.  Urine was clear.  No other clear  source of infection.  Treated with Rocephin and azithromycin day 3/3 today.  WBC already trending down.  Will continue antibiotics today, recheck levels tomorrow morning.  Essential hypertension: Blood pressure stable.  Holding metoprolol lisinopril.  Resume metoprolol.  Type 2 diabetes: Known A1c 8.  On sliding scale.  Will consult palliative care team to help with management and goal of care.   DVT prophylaxis: SCDs Start: 10/02/23 2244   Code Status: Full code Family Communication: Wife at the bedside Disposition Plan: Status is: Inpatient PatientRemains inpatient appropriate because: Inpatient procedures planned     Consultants:  Neurosurgery  Procedures:  None  Antimicrobials:  Rocephin azithromycin 4/5---     Objective: Vitals:   10/05/23 2125 10/06/23 0511 10/06/23 0735 10/06/23 0847  BP: 122/68 (!) 115/54 (!) 114/46 (!) 120/59  Pulse: (!) 132 (!) 120 (!) 120   Resp:  13  18  Temp: 98.4 F (36.9 C) 98.1 F (36.7 C) 98.5 F (36.9 C) 97.6 F (36.4 C)  TempSrc: Oral Oral Oral   SpO2: 92% 90% 94% 96%  Weight:      Height:        Intake/Output Summary (Last 24 hours) at 10/06/2023 1346 Last data filed at 10/06/2023 0848 Gross per 24 hour  Intake 320 ml  Output 500 ml  Net -180 ml   Filed Weights   10/02/23 1509  Weight: 65 kg    Examination:  General exam: Appears anxious.  On room air. Respiratory system: Clear to auscultation. Respiratory effort normal. Cardiovascular system: S1 & S2 heard, RRR.  Gastrointestinal system: Soft.  Nontender.  Bowel sound present. Central nervous system: Alert and awake.  Oriented to himself and situation.   Patient with increased tone, increased resistance  to movement on all 4 extremities.    Data Reviewed: I have personally reviewed following labs and imaging studies  CBC: Recent Labs  Lab 10/02/23 1451 10/02/23 1522 10/03/23 0558 10/05/23 1430 10/06/23 0521  WBC 12.0*  --  10.2 19.1* 14.2*  NEUTROABS  9.4*  --   --   --   --   HGB 10.6* 10.5* 10.6* 11.8* 10.0*  HCT 33.3* 31.0* 31.4* 34.6* 29.8*  MCV 90.7  --  87.2 85.6 87.1  PLT 306  --  276 303 271   Basic Metabolic Panel: Recent Labs  Lab 10/02/23 1451 10/02/23 1522 10/03/23 0558 10/05/23 1430 10/06/23 0521  NA 140 140 142 132* 137  K 4.5 4.5 4.5 4.7 4.3  CL 105 109 108 99 103  CO2 21*  --  24 21* 23  GLUCOSE 193* 191* 121* 168* 124*  BUN 43* 42* 38* 39* 46*  CREATININE 2.60* 2.80* 2.23* 2.28* 2.43*  CALCIUM 9.3  --  9.2 9.0 8.5*  MG  --   --  1.9  --   --   PHOS  --   --  3.8  --   --    GFR: Estimated Creatinine Clearance: 20.4 mL/min (A) (by C-G formula based on SCr of 2.43 mg/dL (H)). Liver Function Tests: Recent Labs  Lab 10/02/23 1451 10/05/23 1430 10/06/23 0521  AST 23 25 16   ALT 19 17 12   ALKPHOS 71 66 54  BILITOT 0.5 1.0 0.7  PROT 7.3 7.5 6.3*  ALBUMIN 3.7 3.5 2.8*   No results for input(s): "LIPASE", "AMYLASE" in the last 168 hours. No results for input(s): "AMMONIA" in the last 168 hours. Coagulation Profile: No results for input(s): "INR", "PROTIME" in the last 168 hours. Cardiac Enzymes: No results for input(s): "CKTOTAL", "CKMB", "CKMBINDEX", "TROPONINI" in the last 168 hours. BNP (last 3 results) No results for input(s): "PROBNP" in the last 8760 hours. HbA1C: No results for input(s): "HGBA1C" in the last 72 hours. CBG: Recent Labs  Lab 10/05/23 1145 10/05/23 1601 10/05/23 2133 10/06/23 0641 10/06/23 1116  GLUCAP 172* 158* 158* 111* 159*   Lipid Profile: No results for input(s): "CHOL", "HDL", "LDLCALC", "TRIG", "CHOLHDL", "LDLDIRECT" in the last 72 hours. Thyroid Function Tests: No results for input(s): "TSH", "T4TOTAL", "FREET4", "T3FREE", "THYROIDAB" in the last 72 hours. Anemia Panel: No results for input(s): "VITAMINB12", "FOLATE", "FERRITIN", "TIBC", "IRON", "RETICCTPCT" in the last 72 hours. Sepsis Labs: No results for input(s): "PROCALCITON", "LATICACIDVEN" in the last 168  hours.  Recent Results (from the past 240 hours)  Culture, blood (Routine X 2) w Reflex to ID Panel     Status: None (Preliminary result)   Collection Time: 10/05/23  2:30 PM   Specimen: BLOOD LEFT HAND  Result Value Ref Range Status   Specimen Description BLOOD LEFT HAND  Final   Special Requests   Final    BOTTLES DRAWN AEROBIC AND ANAEROBIC Blood Culture adequate volume   Culture   Final    NO GROWTH < 24 HOURS Performed at [facility], [address] [place], [place] [address]    Report Status PENDING  Incomplete         Radiology Studies: CT HEAD WO CONTRAST ( ) Result Date: 10/05/2023 CLINICAL DATA:  Initial evaluation for mental status change, unknown cause. EXAM: CT HEAD WITHOUT CONTRAST TECHNIQUE: Contiguous axial images were obtained from the base of the skull through the vertex without intravenous contrast. RADIATION DOSE REDUCTION: This exam was performed according to  the departmental dose-optimization program which includes automated exposure control, adjustment of the mA and/or kV according to patient size and/or use of iterative reconstruction technique. COMPARISON:  CT from 10/02/2023. FINDINGS: Brain: Cerebral volume within normal limits. Mild chronic microvascular ischemic disease for age. Subtle layering hyperdensity now seen within the occipital horns of both lateral ventricles, consistent with small volume intraventricular hemorrhage. Probable trace subarachnoid hemorrhage noted about the room bilateral cerebral hemispheres as well. Debris/proteinaceous material related to infection would be the primary differential consideration. No other acute intracranial hemorrhage. No acute large vessel territory infarct. No mass lesion or midline shift. No hydrocephalus or extra-axial fluid collection. Vascular: No abnormal hyperdense vessel. Scattered calcified atherosclerosis present at skull base. Skull: Scalp soft tissues demonstrate no acute finding. Calvarium intact.  Sinuses/Orbits: Globes orbital soft tissues within normal limits. Paranasal sinuses are clear. No mastoid effusion. Other: None. IMPRESSION: 1. Interval development of small volume intraventricular hemorrhage within the occipital horns of both lateral ventricles, with additional trace subarachnoid hemorrhage about the bilateral cerebral hemispheres as well. 2. Underlying mild chronic microvascular ischemic disease for age. Critical Value/emergent results were called by telephone at the time of interpretation on 10/05/2023 at 6:46 pm to provider Carma Leaven , who verbally acknowledged these results. Electronically Signed   By: Rise Mu M.D.   On: 10/05/2023 18:53        Scheduled Meds:  acetaminophen  1,000 mg Oral Q6H   DULoxetine  20 mg Oral Daily   feeding supplement  237 mL Oral BID BM   ferrous sulfate  325 mg Oral Q breakfast   insulin aspart  0-5 Units Subcutaneous QHS   insulin aspart  0-9 Units Subcutaneous TID WC   mirabegron ER  50 mg Oral Daily   pantoprazole  40 mg Oral BID   pregabalin  50 mg Oral BID   rosuvastatin  20 mg Oral QPM   Continuous Infusions:  azithromycin 500 mg (10/05/23 2107)   cefTRIAXone (ROCEPHIN)  IV 1 g (10/05/23 1931)     LOS: 3 days    Time spent: 40 minutes    Dorcas Carrow, MD [facility]

## 2023-10-06 NOTE — Progress Notes (Signed)
 Physical Therapy Treatment Patient Details Name: Christian Bailey MRN: [id number] DOB: Nov 24, 1940 Today's Date: 10/06/2023   History of Present Illness Patient is an 83 yo male presenting to the ED status post fall on 10/02/23. Patient hit head in fall, with CT of head and spine clear. MRI finding edematous changes from C2-C5. 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  Moderate foraminal stenosis bilaterally at C6-7 and on the right at C7-T1.  Mild foraminal narrowing bilaterally at C2-3 and C3-4; AIR following for liely post-acute rehab; Neurosurgery consulted, considering cervical decompression surgery;  PMH includes:  peripheral artery disease on Plavix and Crestor, RBBB, chronic HFpEF, CKD 4, carotid stenosis, hypertension, hyperlipidemia, type 2 diabetes, chronic anxiety/depression, iron deficiency anemia, GERD    PT Comments  Continuing work on functional mobility and activity tolerance;  Pt more clear than yesterday afternoon; Still quite painful with movement, but following commands and participating; Today, he needed considerably more assist, 2 person Max assist for sit<>stand to/from stedy; Attemtped getting to recliner, however pt was actively stooling, and we opted to get him back to bed; wife, Cammie Mcgee present during session   If plan is discharge home, recommend the following: Two people to help with walking and/or transfers;Two people to help with bathing/dressing/bathroom;Help with stairs or ramp for entrance;Assist for transportation;Assistance with cooking/housework   Can travel by private vehicle     No  Equipment Recommendations  Wheelchair (measurements PT);Wheelchair cushion (measurements PT);Hospital bed    Recommendations for Other Services Rehab consult     Precautions / Restrictions Precautions Precautions: Fall Recall of Precautions/Restrictions: Intact Required Braces or  Orthoses: Cervical Brace Cervical Brace: Soft collar Restrictions Weight Bearing Restrictions Per Provider Order: No     Mobility  Bed Mobility Overal bed mobility: Needs Assistance Bed Mobility: Rolling, Sidelying to Sit Rolling: Max assist Sidelying to sit: +2 for physical assistance, Used rails, Max assist     Sit to sidelying: +2 for physical assistance, Max assist General bed mobility comments: Needing more assist today than previous sessions    Transfers Overall transfer level: Needs assistance Equipment used: Ambulation equipment used Transfers: Sit to/from Stand Sit to Stand: +2 physical assistance, Max assist           General transfer comment: Physical assist to help place hands on teh stedy bar; Heavy posterior lean today in sitting, causing the need for more assist; stood from bed to stedy and from high stedy seat Transfer via Lift Equipment: Stedy  Ambulation/Gait                   Stairs             Wheelchair Mobility     Tilt Bed    Modified Rankin (Stroke Patients Only)       Balance     Sitting balance-Leahy Scale: Poor       Standing balance-Leahy Scale: Poor                              Communication Communication Communication: No apparent difficulties Factors Affecting Communication: Hearing impaired  Cognition Arousal: Alert Behavior During Therapy: WFL for tasks assessed/performed   PT - Cognitive impairments: Awareness, Problem solving, Safety/Judgement                       PT - Cognition Comments: Internally  distracted by pain Following commands: Impaired Following commands impaired: Follows one step commands inconsistently, Follows one step commands with increased time (distracted by pain)    Cueing Cueing Techniques: Verbal cues, Tactile cues  Exercises      General Comments General comments (skin integrity, edema, etc.): Clonus still present bil LEs, but noting less episodes of  clonus than previous session      Pertinent Vitals/Pain Pain Assessment Pain Assessment: Faces Faces Pain Scale: Hurts whole lot Pain Location: neck/back; also LE spasm Pain Descriptors / Indicators: Grimacing, Guarding, Moaning, Pins and needles, Radiating Pain Intervention(s): Monitored during session, Premedicated before session, Repositioned    Home Living                          Prior Function            PT Goals (current goals can now be found in the care plan section) Acute Rehab PT Goals Patient Stated Goal: home PT Goal Formulation: With patient Time For Goal Achievement: 10/17/23 Potential to Achieve Goals: Good Progress towards PT goals: Not progressing toward goals - comment (actively stooling today)    Frequency    Min 2X/week      PT Plan      Co-evaluation              AM-PAC PT "6 Clicks" Mobility   Outcome Measure  Help needed turning from your back to your side while in a flat bed without using bedrails?: A Lot Help needed moving from lying on your back to sitting on the side of a flat bed without using bedrails?: Total Help needed moving to and from a bed to a chair (including a wheelchair)?: Total Help needed standing up from a chair using your arms (e.g., wheelchair or bedside chair)?: Total Help needed to walk in hospital room?: Total Help needed climbing 3-5 steps with a railing? : Total 6 Click Score: 7    End of Session Equipment Utilized During Treatment: Gait belt;Cervical collar Activity Tolerance: Patient tolerated treatment well (limited to bed secondary to active stooling) Patient left: in bed;with call bell/phone within reach;with bed alarm set Nurse Communication: Mobility status (Need for cleanup') PT Visit Diagnosis: Other abnormalities of gait and mobility (R26.89);Muscle weakness (generalized) (M62.81);Repeated falls (R29.6);Pain Pain - part of body:  (Neck, Back, bil LEs)     Time: 1610-9604 PT Time  Calculation (min) (ACUTE ONLY): 34 min  Charges:    $Therapeutic Activity: 23-37 mins PT General Charges $$ ACUTE PT VISIT: 1 Visit                     Van Clines, PT  Acute Rehabilitation Services Office [phone] Secure Chat welcomed    Christian Bailey 10/06/2023, 4:32 PM

## 2023-10-06 NOTE — Care Management Important Message (Signed)
 Important Message  Patient Details  Name: Christian Bailey MRN: [id number] Date of Birth: [birth year]   Important Message Given:  Yes - [employer] IM     Dorena Bodo 10/06/2023, 2:50 PM

## 2023-10-06 NOTE — Progress Notes (Signed)
 Inpatient Rehab Admissions Coordinator:    CIR following. Possible NSGY intervention this week. I will follow up once pt. And family decide if they want surgery and an intervention is completed.   Megan Salon, MS, CCC-SLP Rehab Admissions Coordinator  [phone] (celll) [phone] (office)

## 2023-10-06 NOTE — Care Management Important Message (Signed)
 Important Message  Patient Details  Name: Christian Bailey MRN: [id number] Date of Birth: [birth year]   Important Message Given:        Dorena Bodo 10/06/2023, 2:47 PM

## 2023-10-07 DIAGNOSIS — M549 Dorsalgia, unspecified: Secondary | ICD-10-CM | POA: Diagnosis not present

## 2023-10-07 LAB — CBC
HCT: 29.5 % — ABNORMAL LOW (ref 39.0–52.0)
Hemoglobin: 9.8 g/dL — ABNORMAL LOW (ref 13.0–17.0)
MCH: 29.3 pg (ref 26.0–34.0)
MCHC: 33.2 g/dL (ref 30.0–36.0)
MCV: 88.3 fL (ref 80.0–100.0)
Platelets: 282 10*3/uL (ref 150–400)
RBC: 3.34 MIL/uL — ABNORMAL LOW (ref 4.22–5.81)
RDW: 14 % (ref 11.5–15.5)
WBC: 13.6 10*3/uL — ABNORMAL HIGH (ref 4.0–10.5)
nRBC: 0 % (ref 0.0–0.2)

## 2023-10-07 LAB — COMPREHENSIVE METABOLIC PANEL WITH GFR
ALT: 12 U/L (ref 0–44)
AST: 19 U/L (ref 15–41)
Albumin: 2.4 g/dL — ABNORMAL LOW (ref 3.5–5.0)
Alkaline Phosphatase: 47 U/L (ref 38–126)
Anion gap: 12 (ref 5–15)
BUN: 59 mg/dL — ABNORMAL HIGH (ref 8–23)
CO2: 21 mmol/L — ABNORMAL LOW (ref 22–32)
Calcium: 8.4 mg/dL — ABNORMAL LOW (ref 8.9–10.3)
Chloride: 103 mmol/L (ref 98–111)
Creatinine, Ser: 2.85 mg/dL — ABNORMAL HIGH (ref 0.61–1.24)
GFR, Estimated: 21 mL/min — ABNORMAL LOW (ref >60)
Glucose, Bld: 123 mg/dL — ABNORMAL HIGH (ref 70–99)
Potassium: 3.9 mmol/L (ref 3.5–5.1)
Sodium: 136 mmol/L (ref 135–145)
Total Bilirubin: 0.5 mg/dL (ref 0.0–1.2)
Total Protein: 5.8 g/dL — ABNORMAL LOW (ref 6.5–8.1)

## 2023-10-07 LAB — GLUCOSE, CAPILLARY
Glucose-Capillary: 129 mg/dL — ABNORMAL HIGH (ref 70–99)
Glucose-Capillary: 118 mg/dL — ABNORMAL HIGH (ref 70–99)
Glucose-Capillary: 127 mg/dL — ABNORMAL HIGH (ref 70–99)
Glucose-Capillary: 125 mg/dL — ABNORMAL HIGH (ref 70–99)

## 2023-10-07 LAB — PROCALCITONIN: Procalcitonin: 5.56 ng/mL

## 2023-10-07 MED ORDER — METOPROLOL SUCCINATE ER 25 MG PO TB24
25.0000 mg | ORAL_TABLET | Freq: Every day | ORAL | Status: DC
  Administered 2023-10-07 – 2023-10-09 (×3): 25 mg via ORAL
  Filled 2023-10-07 (×3): qty 1

## 2023-10-07 NOTE — Progress Notes (Signed)
 PT Cancellation Note  Patient Details Name: Christian Bailey MRN: [id number] DOB: May 24, 1941   Cancelled Treatment:     Pt just fell asleep and spouse requesting to return later in PM.  PT states unsure of availability but will attempt.   Lucio Edward 10/07/2023, 10:43 AM

## 2023-10-07 NOTE — Progress Notes (Signed)
 Patient ID: Christian Bailey, male   DOB: 02/02/1941, 83 y.o.   MRN: [id number] [name] I discussed her situation seems that the patient has been sleeping pretty much all day rousing he responds but is a bit confused and assessing his motion again I note that he has substantially increased tone in his upper and his lower extremities so note that he is hard to keep focused on the exam and on any conversation.  Concerned that all this just that he may have some early stages of dementia as he has been off of any rigorous medications that would affect his overall mental status.  I discussed with Ms. Gary Fleet that in light of these chronic illnesses and the deterioration that has had over the 6 months.  His level of function even before that he seemed to be mostly sedentary be minimally ambulatory does have macular degeneration and his vision in his upper ability to function independently from heart point of think that surgical intervention would be a two-level anterior decompression arthrodesis of the cervical spine is likely to yield much improvement from a functional standpoint close patient.  She will need to discuss this with her eldest son and we attempted to call him a couple times while I visited with her.  He was not available this evening, perhaps tomorrow I can have a conversation with him.  As I get to know the patient more time to believe that surgical intervention for his cervical spondylitic myelopathy may not be his best option.

## 2023-10-07 NOTE — Consult Note (Signed)
 Consultation Note Date: 10/07/2023   Patient Name: Christian Bailey  DOB: December 21, 1940  MRN: [id number]  Age / Sex: 83 y.o., male  PCP: Chilton Greathouse, MD Referring Physician: Dorcas Carrow, MD  Reason for Consultation: Establishing goals of care  HPI/Patient Profile: 83 y.o. male  with past medical history of peripheral artery disease on Plavix and Crestor, right bundle branch block, chronic diastolic heart failure, CKD stage IV, ambulatory dysfunction, hypertension hyperlipidemia type 2 diabetes, chronic anxiety and depression and GERD admitted on 10/02/2023 with fall and head injury.   In the ED, MRI cervical spine without contrast showed edematous changes along the posterior spinous ligament from C2-C5 compatible with acute ligamentous injury.  Diffuse edematous changes within the posterior paraspinous musculature bilaterally throughout the cervical spine compatible with muscle strain.  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  Moderate foraminal stenosis bilaterally at C6-7 and on the right at C7-T1.  Mild foraminal narrowing bilaterally at C2-3 and C3-4.  He was also found to have AKI on CKD 4.  Family has been discussing option of surgical decompression with neurosurgeon. PMT has been consulted to assist with goals of care conversation.  Clinical Assessment and Goals of Care:  I have reviewed medical records including EPIC notes, labs and imaging, assessed the patient and then met at the bedside with patient's wife Cammie Mcgee, as well as had phone conversation with patient's son Reuel Boom and daughter-in-law, to discuss diagnosis prognosis, GOC, EOL wishes, disposition and options.  I introduced Palliative Medicine as specialized medical care for people living with serious illness. It focuses on providing relief from the symptoms and stress of a serious illness. The goal is to  improve quality of life for both the patient and the family.  We discussed a brief life review of the patient and then focused on their current illness.   I attempted to elicit values and goals of care important to the patient.    Medical History Review and Understanding:  We discussed patient's acute decline in the context of his chronic comorbidities and worsening cervical stenosis.  Social History: Patient is married to his wife who is his primary caregiver.  He has a son who lives in [place].  He enjoys watching TV and spending time with his family.  He has never been very active.  Functional and Nutritional State: He has walked with a walker past 6 months or so, gradually worsening with use of decline in the past week.  Palliative Symptoms: Delirium/agitation, improving Pain, neck and bilateral legs, improving  Discussion: Patient's wife is very overwhelmed during our meeting today, mostly concerned about both his inability to urinate and fluctuating mental status over the past few days.  She also was concerned that she might be acting selfishly by putting him through a risky surgery with the hope that he might be able to do more for himself.  She is his sole caregiver and having a harder time managing him at home by herself.   Patient has difficulty participating in our discussion today, though he does indicate he is interested in seeing how much he could improve his mobility.  He is not against surgery, though not really able to express his understanding of the risks and benefits.  His wife notes that he was never very active to begin with and most of his quality of life stems from sedentary activities.  Discussed the importance of considering whether it  is his priority to regain functioning or to improve his comfort.  Provided education on different options based on his priorities, including medical management/therapy and comfort care/hospice.  She would like her son to hear this  information and has asked him to call me. I then received a message from patient's son and returned his call.  Reviewed the above conversation.  He is concerned that his mother misunderstood hospice as "just letting him die."  Reviewed the support available in the home and hospice philosophy, as well as process of recertifying hospice every 6 months if patient could still benefit from services while allowing nature to take its course.  Son shares that patient's primary issue is with his incontinence and he feels surgery may be worthwhile if this would improve patient's independence with toileting.  He also acknowledges patient commonly has severe delirium after surgical procedures.  We discussed that this may be a barrier to discharge after surgical intervention.  At son's request, reviewed imaging including head CT and notes per neurosurgeon.  He is appreciative and states he will continue discussing with his mother to try to come to a decision as a family.   The difference between aggressive medical intervention and comfort care was considered in light of the patient's goals of care. Hospice and Palliative Care services outpatient were explained and offered.   Discussed the importance of continued conversation with family and the medical providers regarding overall plan of care and treatment options, ensuring decisions are within the context of the patient's values and GOCs.   Questions and concerns were addressed.  Hard Choices booklet left for review. The family was encouraged to call with questions or concerns.  PMT will continue to support holistically.   SUMMARY OF RECOMMENDATIONS   -Patient's family continue to deliberate on surgery vs medical management vs comfort focused care; seem to be leaning towards proceeding with surgery -Psychosocial and emotional support provided -Ongoing GOC discussions pending clinical course -PMT will continue to follow and support   Prognosis:  Unable to  determine  Discharge Planning: To Be Determined      Primary Diagnoses: Present on Admission:  Fall  Intractable back pain   Physical Exam Vitals and nursing note reviewed.  Constitutional:      General: He is not in acute distress.    Appearance: He is ill-appearing.     Interventions: Nasal cannula in place.     Comments: 1.5L  Cardiovascular:     Rate and Rhythm: Tachycardia present.  Pulmonary:     Effort: Pulmonary effort is normal.  Neurological:     Mental Status: He is alert. He is confused.  Psychiatric:        Cognition and Memory: Cognition is impaired.     Vital Signs: BP (!) 105/59 (BP Location: Left Arm)   Pulse (!) 124   Temp 97.8 F (36.6 C) (Oral)   Resp 18   Ht 5\' 5"  (1.651 m)   Wt 65 kg   SpO2 98%   BMI 23.85 kg/m  Pain Scale: 0-10   Pain Score: Asleep   SpO2: SpO2: 98 % O2 Device:SpO2: 98 % O2 Flow Rate: .O2 Flow Rate (L/min): 2 L/min   Palliative Assessment/Data: TBD    Total time: I spent 95 minutes in the care of the patient today in the above activities and documenting the encounter.  MDM: high    Ervin Rothbauer Jeni Salles, PA-C  Palliative Medicine Team Team phone # [phone]  Thank you for allowing the  Palliative Medicine Team to assist in the care of this patient. Please utilize secure chat with additional questions, if there is no response within 30 minutes please call the above phone number.  Palliative Medicine Team providers are available by phone from 7am to 7pm daily and can be reached through the team cell phone.  Should this patient require assistance outside of these hours, please call the patient's attending physician.

## 2023-10-07 NOTE — Progress Notes (Signed)
 PROGRESS NOTE    Christian Bailey  NWG:[id number] DOB: 06-07-1941 DOA: 10/02/2023 PCP: Chilton Greathouse, MD    Brief Narrative:  83 year old with history of peripheral artery disease on Plavix and Crestor, right bundle branch block, chronic diastolic heart failure, CKD stage IV, ambulatory dysfunction, hypertension hyperlipidemia type 2 diabetes, chronic anxiety and depression and GERD presented to the emergency room with a fall.  Patient reportedly with worsening mobility since last 6 months.  In the emergency room hemodynamically stable.  Imaging consistent with posterior spinous ligament injury from C2-C5.  Difficult to tolerate pain medications and getting delirious with narcotics.  Followed by neurosurgery.  Also with leukocytosis.  Subjective:  Patient was seen and examined.  Wife was at the bedside who was appropriately anxious and worried about his outcome.  Patient was sleepy, he woke up and appropriately responded to me.  He looked more relaxed today with less rigidity. Telemetry monitor shows sinus tachycardia.  Not receiving home dose of metoprolol. Detail discussion with patient's wife at the bedside, palliative service provider also met with the wife at the bedside. Patient has not urinated yet since yesterday, bladder scan shows 500 mL, will do a straight cath.  Assessment & Plan:   Acute metabolic encephalopathy likely secondary to polypharmacy and narcotics: Cervical myelopathy delirium.  Mental status improving. Discontinue all long-acting narcotics. Tylenol 1 g every 6 hours around-the-clock. Dilaudid 0.25 mg every 3 hours as needed, trial was given in he was comfortable after taking Dilaudid. Continue to work with PT OT.  Soft collar. Neurosurgery following, operative versus conservative management.  Acute intractable pain, peripheral arterial disease: Continue Cymbalta, Tylenol 1 g every 6 hours, Lyrica 50 mg twice daily, Flexeril 5 mg 3 times daily as needed and  Dilaudid 0.25 mg every 3 hours as needed.  Bowel regimen.  CKD stage IV: At about baseline.  Anxiety depression: On Cymbalta.  Leukocytosis: Patient presented with WBC count of 19,000.  Urine was clear.  No other clear source of infection.  Treated with Rocephin and azithromycin. WBC already trending down.   Procalcitonin 5.5.  WC count trending down 13.6.  Cultures negative so far.  Continue Rocephin azithromycin for total 5 days.  Essential hypertension: Blood pressure stable.  Holding lisinopril.  Resume metoprolol.  Type 2 diabetes: Known A1c 8.  On sliding scale.  Goal of care: Patient with rapidly progressive weakness and cervical myelopathy.  He has ongoing chronic symptoms.  Gets delirious and hallucination with pain medications.  Neurosurgery following.  He is less likely a candidate for decompression given poor baseline health, chronicity of symptoms as well as challenging recovery and rehab. Will defer surgical decision to neurosurgeon and family. Palliative care provider is meeting with the family today.  May even benefit with palliation or hospice program.   DVT prophylaxis: SCDs Start: 10/02/23 2244   Code Status: Full code Family Communication: Wife at the bedside Disposition Plan: Status is: Inpatient PatientRemains inpatient appropriate because: Critically ill.     Consultants:  Neurosurgery  Procedures:  None  Antimicrobials:  Rocephin azithromycin 4/8---     Objective: Vitals:   10/07/23 0000 10/07/23 0012 10/07/23 0513 10/07/23 0815  BP: 103/82 103/82 (!) 105/51 (!) 105/59  Pulse: (!) 129 (!) 134 (!) 127 (!) 124  Resp: 18 16 16 18   Temp: 98.5 F (36.9 C)  97.8 F (36.6 C)   TempSrc: Oral  Oral   SpO2: 92% (!) 88% 98% 98%  Weight:      Height:  Intake/Output Summary (Last 24 hours) at 10/07/2023 1255 Last data filed at 10/06/2023 1800 Gross per 24 hour  Intake 1845.76 ml  Output 500 ml  Net 1345.76 ml   Filed Weights   10/02/23  1509  Weight: 65 kg    Examination:  General exam: Sleepy.  Wakes up.  Flat affect.  Able to have clear conversation. Respiratory system: Clear to auscultation. Respiratory effort normal. Cardiovascular system: S1 & S2 heard, RRR.  Gastrointestinal system: Soft.  Nontender.  Bowel sound present. Central nervous system: Alert and awake.  Oriented to himself and situation.  Remains quiet and mostly sleepy. Some resting tremors.  Able to move all extremities equally.    Data Reviewed: I have personally reviewed following labs and imaging studies  CBC: Recent Labs  Lab 10/02/23 1451 10/02/23 1522 10/03/23 0558 10/05/23 1430 10/06/23 0521 10/07/23 0449  WBC 12.0*  --  10.2 19.1* 14.2* 13.6*  NEUTROABS 9.4*  --   --   --   --   --   HGB 10.6* 10.5* 10.6* 11.8* 10.0* 9.8*  HCT 33.3* 31.0* 31.4* 34.6* 29.8* 29.5*  MCV 90.7  --  87.2 85.6 87.1 88.3  PLT 306  --  276 303 271 282   Basic Metabolic Panel: Recent Labs  Lab 10/02/23 1451 10/02/23 1522 10/03/23 0558 10/05/23 1430 10/06/23 0521 10/07/23 0449  NA 140 140 142 132* 137 136  K 4.5 4.5 4.5 4.7 4.3 3.9  CL 105 109 108 99 103 103  CO2 21*  --  24 21* 23 21*  GLUCOSE 193* 191* 121* 168* 124* 123*  BUN 43* 42* 38* 39* 46* 59*  CREATININE 2.60* 2.80* 2.23* 2.28* 2.43* 2.85*  CALCIUM 9.3  --  9.2 9.0 8.5* 8.4*  MG  --   --  1.9  --   --   --   PHOS  --   --  3.8  --   --   --    GFR: Estimated Creatinine Clearance: 17.4 mL/min (A) (by C-G formula based on SCr of 2.85 mg/dL (H)). Liver Function Tests: Recent Labs  Lab 10/02/23 1451 10/05/23 1430 10/06/23 0521 10/07/23 0449  AST 23 25 16 19   ALT 19 17 12 12   ALKPHOS 71 66 54 47  BILITOT 0.5 1.0 0.7 0.5  PROT 7.3 7.5 6.3* 5.8*  ALBUMIN 3.7 3.5 2.8* 2.4*   No results for input(s): "LIPASE", "AMYLASE" in the last 168 hours. No results for input(s): "AMMONIA" in the last 168 hours. Coagulation Profile: No results for input(s): "INR", "PROTIME" in the last 168  hours. Cardiac Enzymes: No results for input(s): "CKTOTAL", "CKMB", "CKMBINDEX", "TROPONINI" in the last 168 hours. BNP (last 3 results) No results for input(s): "PROBNP" in the last 8760 hours. HbA1C: No results for input(s): "HGBA1C" in the last 72 hours. CBG: Recent Labs  Lab 10/06/23 1609 10/06/23 1611 10/06/23 2213 10/07/23 0614 10/07/23 1144  GLUCAP 188* 183* 189* 129* 118*   Lipid Profile: No results for input(s): "CHOL", "HDL", "LDLCALC", "TRIG", "CHOLHDL", "LDLDIRECT" in the last 72 hours. Thyroid Function Tests: No results for input(s): "TSH", "T4TOTAL", "FREET4", "T3FREE", "THYROIDAB" in the last 72 hours. Anemia Panel: No results for input(s): "VITAMINB12", "FOLATE", "FERRITIN", "TIBC", "IRON", "RETICCTPCT" in the last 72 hours. Sepsis Labs: Recent Labs  Lab 10/07/23 0449  PROCALCITON 5.56    Recent Results (from the past 240 hours)  Culture, blood (Routine X 2) w Reflex to ID Panel     Status: None (Preliminary result)  Collection Time: 10/05/23  2:30 PM   Specimen: BLOOD  Result Value Ref Range Status   Specimen Description BLOOD BLOOD LEFT HAND  Final   Special Requests   Final    BOTTLES DRAWN AEROBIC AND ANAEROBIC Blood Culture adequate volume   Culture   Final    NO GROWTH 2 DAYS Performed at [facility], [address], [place], [place] [address]    Report Status PENDING  Incomplete  Culture, blood (Routine X 2) w Reflex to ID Panel     Status: None (Preliminary result)   Collection Time: 10/05/23  2:30 PM   Specimen: BLOOD LEFT HAND  Result Value Ref Range Status   Specimen Description BLOOD LEFT HAND  Final   Special Requests   Final    BOTTLES DRAWN AEROBIC AND ANAEROBIC Blood Culture adequate volume   Culture   Final    NO GROWTH 2 DAYS Performed at [facility], [address] [place], [place] [address]    Report Status PENDING  Incomplete         Radiology Studies: CT HEAD WO CONTRAST ( ) Result Date:  10/05/2023 CLINICAL DATA:  Initial evaluation for mental status change, unknown cause. EXAM: CT HEAD WITHOUT CONTRAST TECHNIQUE: Contiguous axial images were obtained from the base of the skull through the vertex without intravenous contrast. RADIATION DOSE REDUCTION: This exam was performed according to the departmental dose-optimization program which includes automated exposure control, adjustment of the mA and/or kV according to patient size and/or use of iterative reconstruction technique. COMPARISON:  CT from 10/02/2023. FINDINGS: Brain: Cerebral volume within normal limits. Mild chronic microvascular ischemic disease for age. Subtle layering hyperdensity now seen within the occipital horns of both lateral ventricles, consistent with small volume intraventricular hemorrhage. Probable trace subarachnoid hemorrhage noted about the room bilateral cerebral hemispheres as well. Debris/proteinaceous material related to infection would be the primary differential consideration. No other acute intracranial hemorrhage. No acute large vessel territory infarct. No mass lesion or midline shift. No hydrocephalus or extra-axial fluid collection. Vascular: No abnormal hyperdense vessel. Scattered calcified atherosclerosis present at skull base. Skull: Scalp soft tissues demonstrate no acute finding. Calvarium intact. Sinuses/Orbits: Globes orbital soft tissues within normal limits. Paranasal sinuses are clear. No mastoid effusion. Other: None. IMPRESSION: 1. Interval development of small volume intraventricular hemorrhage within the occipital horns of both lateral ventricles, with additional trace subarachnoid hemorrhage about the bilateral cerebral hemispheres as well. 2. Underlying mild chronic microvascular ischemic disease for age. Critical Value/emergent results were called by telephone at the time of interpretation on 10/05/2023 at 6:46 pm to provider Carma Leaven , who verbally acknowledged these results.  Electronically Signed   By: Rise Mu M.D.   On: 10/05/2023 18:53        Scheduled Meds:  acetaminophen  1,000 mg Oral Q6H   azithromycin  500 mg Oral Daily   DULoxetine  20 mg Oral Daily   feeding supplement  237 mL Oral BID BM   ferrous sulfate  325 mg Oral Q breakfast   insulin aspart  0-5 Units Subcutaneous QHS   insulin aspart  0-9 Units Subcutaneous TID WC   metoprolol succinate  25 mg Oral Daily   mirabegron ER  50 mg Oral Daily   pantoprazole  40 mg Oral BID   pregabalin  50 mg Oral BID   rosuvastatin  20 mg Oral QPM   Continuous Infusions:  cefTRIAXone (ROCEPHIN)  IV 1 g (10/06/23 1759)  LOS: 4 days    Time spent: 40 minutes    Dorcas Carrow, MD [facility]

## 2023-10-07 NOTE — Plan of Care (Signed)

## 2023-10-08 DIAGNOSIS — M549 Dorsalgia, unspecified: Secondary | ICD-10-CM | POA: Diagnosis not present

## 2023-10-08 LAB — COMPREHENSIVE METABOLIC PANEL WITH GFR
ALT: 20 U/L (ref 0–44)
AST: 29 U/L (ref 15–41)
Albumin: 2.6 g/dL — ABNORMAL LOW (ref 3.5–5.0)
Alkaline Phosphatase: 69 U/L (ref 38–126)
Anion gap: 14 (ref 5–15)
BUN: 51 mg/dL — ABNORMAL HIGH (ref 8–23)
CO2: 17 mmol/L — ABNORMAL LOW (ref 22–32)
Calcium: 8.9 mg/dL (ref 8.9–10.3)
Chloride: 107 mmol/L (ref 98–111)
Creatinine, Ser: 2.36 mg/dL — ABNORMAL HIGH (ref 0.61–1.24)
GFR, Estimated: 27 mL/min — ABNORMAL LOW (ref >60)
Glucose, Bld: 147 mg/dL — ABNORMAL HIGH (ref 70–99)
Potassium: 4.3 mmol/L (ref 3.5–5.1)
Sodium: 138 mmol/L (ref 135–145)
Total Bilirubin: 1 mg/dL (ref 0.0–1.2)
Total Protein: 6.4 g/dL — ABNORMAL LOW (ref 6.5–8.1)

## 2023-10-08 LAB — CBC
HCT: 32 % — ABNORMAL LOW (ref 39.0–52.0)
Hemoglobin: 10.5 g/dL — ABNORMAL LOW (ref 13.0–17.0)
MCH: 29.2 pg (ref 26.0–34.0)
MCHC: 32.8 g/dL (ref 30.0–36.0)
MCV: 88.9 fL (ref 80.0–100.0)
Platelets: 319 10*3/uL (ref 150–400)
RBC: 3.6 MIL/uL — ABNORMAL LOW (ref 4.22–5.81)
RDW: 13.9 % (ref 11.5–15.5)
WBC: 13.2 10*3/uL — ABNORMAL HIGH (ref 4.0–10.5)
nRBC: 0 % (ref 0.0–0.2)

## 2023-10-08 LAB — GLUCOSE, CAPILLARY
Glucose-Capillary: 152 mg/dL — ABNORMAL HIGH (ref 70–99)
Glucose-Capillary: 128 mg/dL — ABNORMAL HIGH (ref 70–99)
Glucose-Capillary: 109 mg/dL — ABNORMAL HIGH (ref 70–99)
Glucose-Capillary: 146 mg/dL — ABNORMAL HIGH (ref 70–99)

## 2023-10-08 NOTE — Progress Notes (Signed)
 Occupational Therapy Treatment Patient Details Name: Christian Bailey MRN: [id number] DOB: 1940-07-19 Today's Date: 10/08/2023   History of present illness Patient is an 83 yo male presenting to the ED status post fall on 10/02/23. Patient hit head in fall, with CT of head and spine clear. MRI finding edematous changes from C2-C5. 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  Moderate foraminal stenosis bilaterally at C6-7 and on the right at C7-T1.  Mild foraminal narrowing bilaterally at C2-3 and C3-4; AIR following for liely post-acute rehab; Neurosurgery consulted, considering cervical decompression surgery;  PMH includes:  peripheral artery disease on Plavix and Crestor, RBBB, chronic HFpEF, CKD 4, carotid stenosis, hypertension, hyperlipidemia, type 2 diabetes, chronic anxiety/depression, iron deficiency anemia, GERD   OT comments  Patient received in supine and alert. Patient stating he believed he was hallucinating this morning and thought he was in [place].  Patient demonstrating gains with bed mobility with max assist of one from max assist +2 to get to EOB. Patient requiring BUE support while on EOB due to posterior leaning. Patient stood from EOB into [place] with max assist +2 and was found have been bowel incontinent. Patient performed 2 standing in stedy with mod assist +2 to allow for cleaning before transfer to recliner. Patient performed grooming while seated in recliner with education on 2 cup technique. Patient will benefit from intensive inpatient follow-up therapy, >3 hours/day. Acute OT to continue to follow to address established goals to facilitate DC to next venue of care.       If plan is discharge home, recommend the following:  Two people to help with walking and/or transfers;A lot of help with bathing/dressing/bathroom;Assistance with cooking/housework;Assist for transportation;Help with stairs  or ramp for entrance   Equipment Recommendations  Wheelchair (measurements OT);Wheelchair cushion (measurements OT)    Recommendations for Other Services      Precautions / Restrictions Precautions Precautions: Fall Recall of Precautions/Restrictions: Intact Required Braces or Orthoses: Cervical Brace Cervical Brace: Soft collar Restrictions Weight Bearing Restrictions Per Provider Order: No       Mobility Bed Mobility Overal bed mobility: Needs Assistance Bed Mobility: Rolling, Sidelying to Sit Rolling: Max assist Sidelying to sit: Max assist       General bed mobility comments: max assist with trunk and BLEs    Transfers Overall transfer level: Needs assistance Equipment used: Ambulation equipment used Transfers: Sit to/from Stand Sit to Stand: Max assist, +2 physical assistance           General transfer comment: max assist +2 to stand from EOB into steady and mod assist +2 from stedy pads Transfer via Lift Equipment: Stedy   Balance Overall balance assessment: Needs assistance Sitting-balance support: Bilateral upper extremity supported, Feet supported Sitting balance-Leahy Scale: Poor Sitting balance - Comments: required BUE support for balance on EOB Postural control: Posterior lean Standing balance support: Bilateral upper extremity supported, During functional activity Standing balance-Leahy Scale: Poor Standing balance comment: stood in [place] with max to mod assist +2                           ADL either performed or assessed with clinical judgement   ADL Overall ADL's : Needs assistance/impaired     Grooming: Wash/dry hands;Wash/dry face;Oral care;Supervision/safety;Cueing for compensatory techniques;Sitting Grooming Details (indicate cue type and reason): instructions on 2 cup technique     Lower Body Bathing:  Maximal assistance;Sit to/from stand Lower Body Bathing Details (indicate cue type and reason): stood in [place] to allow for  cleaning following bowel incontenance Upper Body Dressing : Minimal assistance;Sitting Upper Body Dressing Details (indicate cue type and reason): change gown Lower Body Dressing: Total assistance Lower Body Dressing Details (indicate cue type and reason): change socks Toilet Transfer: Moderate assistance;+2 for physical assistance Toilet Transfer Details (indicate cue type and reason): simulated with Stedy           General ADL Comments: patient demonstrated difficulty with coordination during grooming tasks but able to perform    Extremity/Trunk Assessment Upper Extremity Assessment Upper Extremity Assessment: Defer to OT evaluation            Vision       Perception     Praxis     Communication Communication Communication: No apparent difficulties Factors Affecting Communication: Hearing impaired   Cognition Arousal: Alert Behavior During Therapy: WFL for tasks assessed/performed Cognition: No apparent impairments             OT - Cognition Comments: patient states he awoke this morning with hallucinations but able to give correct place and time                 Following commands: Impaired Following commands impaired: Follows one step commands inconsistently, Follows one step commands with increased time      Cueing   Cueing Techniques: Verbal cues, Tactile cues  Exercises      Shoulder Instructions       General Comments      Pertinent Vitals/ Pain       Pain Assessment Pain Assessment: Faces Faces Pain Scale: Hurts little more Pain Location: neck/back Pain Descriptors / Indicators: Grimacing, Guarding Pain Intervention(s): Limited activity within patient's tolerance, Monitored during session, Repositioned  Home Living                                          Prior Functioning/Environment              Frequency  Min 2X/week        Progress Toward Goals  OT Goals(current goals can now be found in the  care plan section)  Progress towards OT goals: Progressing toward goals  Acute Rehab OT Goals Patient Stated Goal: get better OT Goal Formulation: With patient Time For Goal Achievement: 10/17/23 Potential to Achieve Goals: Fair ADL Goals Pt Will Perform Lower Body Bathing: with min assist;sitting/lateral leans;sit to/from stand Pt Will Perform Lower Body Dressing: with min assist;sitting/lateral leans;sit to/from stand Pt Will Transfer to Toilet: with min assist;stand pivot transfer;bedside commode Pt Will Perform Toileting - Clothing Manipulation and hygiene: with min assist;sitting/lateral leans;sit to/from stand Additional ADL Goal #1: Patient will be able to transition to EOB with min A as a precursor to OOB activities.  Plan      Co-evaluation                 AM-PAC OT "6 Clicks" Daily Activity     Outcome Measure   Help from another person eating meals?: A Little Help from another person taking care of personal grooming?: A Little Help from another person toileting, which includes using toliet, bedpan, or urinal?: Total Help from another person bathing (including washing, rinsing, drying)?: A Lot Help from another person to put on and taking off regular upper body clothing?:  A Little Help from another person to put on and taking off regular lower body clothing?: Total 6 Click Score: 13    End of Session Equipment Utilized During Treatment: Gait belt;Rolling walker (2 wheels);Other (comment) Antony Salmon)  OT Visit Diagnosis: Unsteadiness on feet (R26.81);Other abnormalities of gait and mobility (R26.89);Repeated falls (R29.6);Muscle weakness (generalized) (M62.81);History of falling (Z91.81);Pain Pain - part of body:  (neck and back)   Activity Tolerance Patient tolerated treatment well   Patient Left in chair;with call bell/phone within reach;with chair alarm set   Nurse Communication Mobility status;Need for lift equipment        Time: [phone] OT Time  Calculation (min): 26 min  Charges: OT General Charges $OT Visit: 1 Visit OT Treatments $Self Care/Home Management : 23-37 mins  Alfonse Flavors, OTA Acute Rehabilitation Services  Office [phone]   Dewain Penning 10/08/2023, 11:38 AM

## 2023-10-08 NOTE — Progress Notes (Signed)
 PROGRESS NOTE    Christian Bailey  WUJ:[id number] DOB: 04/25/1941 DOA: 10/02/2023 PCP: Chilton Greathouse, MD    Brief Narrative:  83 year old with history of peripheral artery disease on Plavix and Crestor, right bundle branch block, chronic diastolic heart failure, CKD stage IV, ambulatory dysfunction, hypertension,  hyperlipidemia,  type 2 diabetes, chronic anxiety and depression and GERD presented to the emergency room with a fall.  Patient reportedly with worsening mobility since last 6 months.  In the emergency room hemodynamically stable.  Imaging consistent with posterior spinous ligament injury from C2-C5.  Difficult to tolerate pain medications and getting delirious with narcotics.  Followed by neurosurgery.  Also with leukocytosis.  Subjective:  Patient was seen and examined.  On my early morning rounds, he was sitting in the chair.  Patient was very clear and able to keep up usual conversation about his illness, he was able to talk to me about his family and passing of his daughter-in-law recently. Went back to examine patient with wife at the bedside, patient was hallucinating.  He was asking [place] to remove fire from his foot.  He was also seeing things not present in the room.  He was able to be reoriented.  Today he denied any pain.  He had received 1 dose of Dilaudid 0.25 mg earlier before starting symptoms. Detailed discussion below with the wife at the bedside.  Assessment & Plan:   Acute metabolic encephalopathy likely secondary to polypharmacy and narcotics: Cervical myelopathy delirium.  Mental status waxes and wanes.  Some improvement of delirium symptoms today. Discontinue all long-acting narcotics. Tylenol 1 g every 6 hours around-the-clock. Patient was given trial of Dilaudid, wife requested to discontinue today.   Continue to work with PT OT.  Soft collar. Neurosurgery following, operative versus conservative management.  Acute intractable pain, peripheral arterial  disease: Continue Cymbalta, Tylenol 1 g every 6 hours, Lyrica 50 mg twice daily, Flexeril 5 mg 3 times daily.  Bowel regimen.  CKD stage IV: At about baseline.  Anxiety depression: On Cymbalta.  Leukocytosis: Patient presented with WBC count of 19,000.  Urine was clear.  No other clear source of infection.  Treated with Rocephin and azithromycin. WBC already trending down.   Procalcitonin 5.5.  WC count trending down. Cultures negative so far.  Continue Rocephin azithromycin for total 5 days.  Essential hypertension: Blood pressure stable.  Holding lisinopril.  Resumed metoprolol.  Type 2 diabetes: Known A1c 8.  On sliding scale.  Goal of care: Patient with rapidly progressive weakness and cervical myelopathy.  He has ongoing chronic symptoms.  Gets delirious and hallucination with pain medications.  Neurosurgery following.  He is less likely a candidate for decompression given poor baseline health, chronicity of symptoms as well as challenging recovery and rehab. Will defer surgical decision to neurosurgeon and family. 4/11 Discussed with wife at the bedside.  Recommended to try rehab with palliative care follow-up and ultimately hospice if not doing well.  She understands. Discussed with CIR admissions.  They have bed available.  Will try to reach to Dr. Danielle Dess, if surgery is not an option, will transition him to acute inpatient rehab in an attempt to give him rehab and allow him enough time to monitor his clinical status. CODE STATUS, DNR with limited intervention.   DVT prophylaxis: SCDs Start: 10/02/23 2244   Code Status: DNR with limited intervention. Family Communication: Wife at the bedside Disposition Plan: Status is: Inpatient PatientRemains inpatient appropriate because: Critically ill.  Needs rehab.  Consultants:  Neurosurgery Palliative care  Procedures:  None  Antimicrobials:  Rocephin azithromycin 4/8---     Objective: Vitals:   10/08/23 0423  10/08/23 0628 10/08/23 0850 10/08/23 1303  BP: (!) 160/69  (!) 137/58 (!) 144/57  Pulse: 99  (!) 106 (!) 107  Resp: 17  18 16   Temp: 98.7 F (37.1 C)  (!) 97.4 F (36.3 C) (!) 97.5 F (36.4 C)  TempSrc: Oral  Oral   SpO2: 98%  96% 100%  Weight:  65.4 kg    Height:        Intake/Output Summary (Last 24 hours) at 10/08/2023 1346 Last data filed at 10/08/2023 2831 Gross per 24 hour  Intake --  Output 600 ml  Net -600 ml   Filed Weights   10/02/23 1509 10/08/23 0628  Weight: 65 kg 65.4 kg    Examination: In morning rounds  General exam: Alert awake and mostly oriented.  Slow to respond however was quiet and composed.  Soft collar on. Respiratory system: Clear to auscultation. Respiratory effort normal. Cardiovascular system: S1 & S2 heard, RRR.  Gastrointestinal system: Soft.  Nontender.  Bowel sound present. Central nervous system: Alert and awake.  Oriented to himself and situation.  Remains quiet and composed.  On repeat exam, he was alert awake however actively having visual hallucinations.    Data Reviewed: I have personally reviewed following labs and imaging studies  CBC: Recent Labs  Lab 10/02/23 1451 10/02/23 1522 10/03/23 0558 10/05/23 1430 10/06/23 0521 10/07/23 0449 10/08/23 0400  WBC 12.0*  --  10.2 19.1* 14.2* 13.6* 13.2*  NEUTROABS 9.4*  --   --   --   --   --   --   HGB 10.6*   < > 10.6* 11.8* 10.0* 9.8* 10.5*  HCT 33.3*   < > 31.4* 34.6* 29.8* 29.5* 32.0*  MCV 90.7  --  87.2 85.6 87.1 88.3 88.9  PLT 306  --  276 303 271 282 319   < > = values in this interval not displayed.   Basic Metabolic Panel: Recent Labs  Lab 10/03/23 0558 10/05/23 1430 10/06/23 0521 10/07/23 0449 10/08/23 0400  NA 142 132* 137 136 138  K 4.5 4.7 4.3 3.9 4.3  CL 108 99 103 103 107  CO2 24 21* 23 21* 17*  GLUCOSE 121* 168* 124* 123* 147*  BUN 38* 39* 46* 59* 51*  CREATININE 2.23* 2.28* 2.43* 2.85* 2.36*  CALCIUM 9.2 9.0 8.5* 8.4* 8.9  MG 1.9  --   --   --   --    PHOS 3.8  --   --   --   --    GFR: Estimated Creatinine Clearance: 21 mL/min (A) (by C-G formula based on SCr of 2.36 mg/dL (H)). Liver Function Tests: Recent Labs  Lab 10/02/23 1451 10/05/23 1430 10/06/23 0521 10/07/23 0449 10/08/23 0400  AST 23 25 16 19 29   ALT 19 17 12 12 20   ALKPHOS 71 66 54 47 69  BILITOT 0.5 1.0 0.7 0.5 1.0  PROT 7.3 7.5 6.3* 5.8* 6.4*  ALBUMIN 3.7 3.5 2.8* 2.4* 2.6*   No results for input(s): "LIPASE", "AMYLASE" in the last 168 hours. No results for input(s): "AMMONIA" in the last 168 hours. Coagulation Profile: No results for input(s): "INR", "PROTIME" in the last 168 hours. Cardiac Enzymes: No results for input(s): "CKTOTAL", "CKMB", "CKMBINDEX", "TROPONINI" in the last 168 hours. BNP (last 3 results) No results for input(s): "PROBNP" in the last 8760 hours.  HbA1C: No results for input(s): "HGBA1C" in the last 72 hours. CBG: Recent Labs  Lab 10/07/23 1144 10/07/23 1701 10/07/23 2123 10/08/23 0631 10/08/23 1113  GLUCAP 118* 127* 125* 152* 128*   Lipid Profile: No results for input(s): "CHOL", "HDL", "LDLCALC", "TRIG", "CHOLHDL", "LDLDIRECT" in the last 72 hours. Thyroid Function Tests: No results for input(s): "TSH", "T4TOTAL", "FREET4", "T3FREE", "THYROIDAB" in the last 72 hours. Anemia Panel: No results for input(s): "VITAMINB12", "FOLATE", "FERRITIN", "TIBC", "IRON", "RETICCTPCT" in the last 72 hours. Sepsis Labs: Recent Labs  Lab 10/07/23 0449  PROCALCITON 5.56    Recent Results (from the past 240 hours)  Culture, blood (Routine X 2) w Reflex to ID Panel     Status: None (Preliminary result)   Collection Time: 10/05/23  2:30 PM   Specimen: BLOOD  Result Value Ref Range Status   Specimen Description BLOOD BLOOD LEFT HAND  Final   Special Requests   Final    BOTTLES DRAWN AEROBIC AND ANAEROBIC Blood Culture adequate volume   Culture   Final    NO GROWTH 3 DAYS Performed at [facility], [address], [place],  [place] [address]    Report Status PENDING  Incomplete  Culture, blood (Routine X 2) w Reflex to ID Panel     Status: None (Preliminary result)   Collection Time: 10/05/23  2:30 PM   Specimen: BLOOD LEFT HAND  Result Value Ref Range Status   Specimen Description BLOOD LEFT HAND  Final   Special Requests   Final    BOTTLES DRAWN AEROBIC AND ANAEROBIC Blood Culture adequate volume   Culture   Final    NO GROWTH 3 DAYS Performed at [facility], [address], [place], [place] [address]    Report Status PENDING  Incomplete         Radiology Studies: No results found.       Scheduled Meds:  acetaminophen  1,000 mg Oral Q6H   azithromycin  500 mg Oral Daily   DULoxetine  20 mg Oral Daily   feeding supplement  237 mL Oral BID BM   ferrous sulfate  325 mg Oral Q breakfast   insulin aspart  0-5 Units Subcutaneous QHS   insulin aspart  0-9 Units Subcutaneous TID WC   metoprolol succinate  25 mg Oral Daily   mirabegron ER  50 mg Oral Daily   pantoprazole  40 mg Oral BID   pregabalin  50 mg Oral BID   rosuvastatin  20 mg Oral QPM   Continuous Infusions:     LOS: 5 days    Time spent: 40 minutes    Dorcas Carrow, MD [facility]

## 2023-10-08 NOTE — Progress Notes (Signed)
 Daily Progress Note   Patient Name: Christian Bailey       Date: 10/08/2023 DOB: [birth year]  Age: 83 y.o. MRN#: [id number] Attending Physician: Dorcas Carrow, MD Primary Care Physician: Chilton Greathouse, MD Admit Date: 10/02/2023  Reason for Consultation/Follow-up: Establishing goals of care  Length of Stay: 5  Current Medications: Scheduled Meds:   acetaminophen  1,000 mg Oral Q6H   azithromycin  500 mg Oral Daily   DULoxetine  20 mg Oral Daily   feeding supplement  237 mL Oral BID BM   ferrous sulfate  325 mg Oral Q breakfast   insulin aspart  0-5 Units Subcutaneous QHS   insulin aspart  0-9 Units Subcutaneous TID WC   metoprolol succinate  25 mg Oral Daily   mirabegron ER  50 mg Oral Daily   pantoprazole  40 mg Oral BID   pregabalin  50 mg Oral BID   rosuvastatin  20 mg Oral QPM    Continuous Infusions:   PRN Meds: cyclobenzaprine, melatonin, polyethylene glycol, prochlorperazine  Physical Exam Constitutional:      General: He is not in acute distress.    Appearance: He is ill-appearing.  HENT:     Head: Normocephalic and atraumatic.  Cardiovascular:     Rate and Rhythm: Tachycardia present.  Pulmonary:     Effort: Pulmonary effort is normal.  Neurological:     Mental Status: He is alert. He is disoriented and confused.  Psychiatric:        Attention and Perception: He perceives visual hallucinations.             Vital Signs: BP (!) 144/57 (BP Location: Left Arm)   Pulse (!) 107   Temp (!) 97.5 F (36.4 C)   Resp 16   Ht 5\' 5"  (1.651 m)   Wt 65.4 kg   SpO2 100%   BMI 23.99 kg/m  SpO2: SpO2: 100 % O2 Device: O2 Device: Room Air O2 Flow Rate: O2 Flow Rate (L/min): (P) 2 L/min    Patient Active Problem List   Diagnosis Date Noted   Intractable back pain  10/03/2023   Fall 10/02/2023   Acute renal failure superimposed on stage 3b chronic kidney disease, unspecified acute renal failure type (HCC) 05/07/2023   Leukocytosis 05/07/2023   Exudative age-related macular degeneration of left eye with  inactive choroidal neovascularization (HCC) 03/25/2020   Exudative age-related macular degeneration of right eye with inactive choroidal neovascularization (HCC) 03/25/2020   Advanced nonexudative age-related macular degeneration of right eye with subfoveal involvement 03/25/2020   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 involvement 03/25/2020   Bacteremia due to Gram-negative bacteria 01/19/2019   CKD (chronic kidney disease) stage 3, GFR 30-59 ml/min (HCC) 01/19/2019   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) 07/06/2018   S/P shoulder replacement, right 12/07/2016   PVD (peripheral vascular disease) (HCC) 09/18/2016   Sinus tachycardia 05/15/2014   Carotid artery disease (HCC) 04/26/2014   PAD (peripheral artery disease) (HCC) 06/12/2013   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 male  with past medical history of peripheral artery disease on Plavix and Crestor, right bundle branch block, chronic diastolic heart failure, CKD stage IV, ambulatory dysfunction, hypertension hyperlipidemia type 2 diabetes, chronic anxiety and depression and GERD admitted on 10/02/2023 with fall and head injury.    In the ED, MRI cervical spine without contrast showed edematous changes along the posterior spinous ligament from C2-C5 compatible with acute  ligamentous injury.  Diffuse edematous changes within the posterior paraspinous musculature bilaterally throughout the cervical spine compatible with muscle strain.  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  Moderate foraminal stenosis bilaterally at C6-7 and on the right at C7-T1.  Mild foraminal narrowing bilaterally at C2-3 and C3-4.  He was also found to have AKI on CKD 4.   Family has been discussing option of surgical decompression with neurosurgeon. PMT has been consulted to assist with goals of care conversation.   Today's Discussion: Patient sitting upright in recliner in NAD. He is calm but disoriented with visual hallucinations. Wife is at bedside. She shares that the last few days have been a lot for her. She shares that it is hard for her to understand how her husband's mental status has changed so drastically over the last few days. Emotional support provided. Patient's wife shares the plan for him to discharge to CIR-- she is hopeful he will qualify for the acute inpatient rehab. She is hopeful he will have some improvement in his function with rehab. It has been very difficult for her to manage him at home as she is his only caregiver.   They have a supportive family with one son living down the street and the other son in [place]. She shares her son in [place]'s wife passed away a month ago so she is leaning more on her son in [place].  We discuss goal of care. She shares her husband was DNR and would not want intubation. Changed to DNR/DNI. She shares that her husband would likely not want to stay in a SNF and would always prefer to be at home. We discuss options moving forward including outpatient palliative care at discharge-- she is open to this. We discuss options moving forward if the patient declines or does not have meaningful improvement including home hospice. She is glad to  know her options. Emotional support provided.  Encouraged patient's wife to call PMT with questions or concerns. PMT will continue to follow.  Recommendations/Plan: Changed to DNR/DNI Continue current care Time for outcomes. Likely discharge to CIR-- wife really prefers acute inpatient rehab Outpatient palliative follow up at discharge Continued PMT support    Code Status:    Code Status Orders  (From admission, onward)           Start     Ordered   10/08/23 1211  Do not attempt resuscitation (DNR)- Limited -Do Not Intubate (DNI)  (Code Status)  Continuous       Question Answer Comment  If pulseless and not breathing No CPR or chest compressions.   In Pre-Arrest Conditions (Patient Is Breathing and Has A Pulse) Do not intubate. Provide all appropriate non-invasive medical interventions. Avoid ICU transfer unless indicated or required.   Consent: Discussion documented in EHR or advanced directives reviewed      10/08/23 1210         Extensive chart review has been completed prior to seeing the patient including labs, vital signs, imaging, progress/consult notes, orders, medications, and available advance directive documents.  Care plan was discussed with Dr. Jerral Ralph and bedside RN  Time spent: 65 minutes  Thank you for allowing the Palliative Medicine Team to assist in the care of this patient.    Sherryll Burger, NP  Please contact Palliative Medicine Team phone at [phone] for questions and concerns.

## 2023-10-08 NOTE — Progress Notes (Signed)
 Patient ID: Christian Bailey, male   DOB: [birth year], 83 y.o.   MRN: [id number] I visited with patient this morning and saw that he was out of bed in a chair and was quite leucid in his conversation and even in his movements. This was quite encouraging... Came back this afternoon and he seems again quite out of it having hallucinations but Q"s easily... more tremulous movements as before. Apparently he receved some diludid which brought about the change. Discussed his situation with  wife and son Reuel Boom by phone. His son would desire aggressive ( surgical) care as he notes that despite his morbidities and limitations he is still quite functional at home.[name] KitchenMarland Kitchen"He was working on his taxes last week when I talked to him". For now I will continue to follow his situation. I will see him on Monday here or in rehab if he is transferred.  Risks of surgery were discussed and explained to his son and he understands.[name] KitchenMarland Kitchen

## 2023-10-08 NOTE — Progress Notes (Signed)
 Physical Therapy Treatment Patient Details Name: Christian Bailey MRN: [id number] DOB: Sep 23, 1940 Today's Date: 10/08/2023   History of Present Illness Patient is an 83 yo male presenting to the ED status post fall on 10/02/23. Patient hit head in fall, with CT of head and spine clear. MRI finding edematous changes from C2-C5. 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  Moderate foraminal stenosis bilaterally at C6-7 and on the right at C7-T1.  Mild foraminal narrowing bilaterally at C2-3 and C3-4; AIR following for likely post-acute rehab; Neurosurgery consulted, considering cervical decompression surgery;  PMH includes:  peripheral artery disease on Plavix and Crestor, RBBB, chronic HFpEF, CKD 4, carotid stenosis, hypertension, hyperlipidemia, type 2 diabetes, chronic anxiety/depression, iron deficiency anemia, GERD    PT Comments  PT hallucinating throughout session, oriented to self, location, and time but states he is here because "I am going to be executed" and "my wife and I were involved in a bank robbery". Pt states there is smoke in the room, is talking to people who aren't present.    Pt noted to be soiled in stool, requiring significant physical and pericare assist for clean up. Pt overall requiring max assist for moving to/from EOB, unable to sit EOB unsupported and fatigues quickly. Pt tolerated UE and LE exercises well. Will continue to follow.    If plan is discharge home, recommend the following: Two people to help with walking and/or transfers;Two people to help with bathing/dressing/bathroom;Help with stairs or ramp for entrance;Assist for transportation;Assistance with cooking/housework   Can travel by Doctor, hospital (measurements PT);Wheelchair cushion (measurements PT);Hospital bed    Recommendations for Other Services       Precautions /  Restrictions Precautions Precautions: Fall Recall of Precautions/Restrictions: Impaired Precaution/Restrictions Comments: very confused Required Braces or Orthoses: Cervical Brace Cervical Brace: Soft collar Restrictions Weight Bearing Restrictions Per Provider Order: No     Mobility  Bed Mobility Overal bed mobility: Needs Assistance Bed Mobility: Rolling, Sidelying to Sit, Sit to Sidelying Rolling: Max assist Sidelying to sit: Max assist     Sit to sidelying: Max assist General bed mobility comments: max assist with trunk and BLEs    Transfers                   General transfer comment: unable, pt with heavy posterior bias in sitting and unable to gain sitting balance to attempt progression OOB Transfer via Lift Equipment: Stedy  Ambulation/Gait                   Stairs             Wheelchair Mobility     Tilt Bed    Modified Rankin (Stroke Patients Only)       Balance Overall balance assessment: Needs assistance Sitting-balance support: Bilateral upper extremity supported, Feet supported Sitting balance-Leahy Scale: Poor Sitting balance - Comments: heavy posterior bias requiring mod assist to correct, sitting tolerance x2 minutes Postural control: Posterior lean                                  Communication Communication Communication: No apparent difficulties Factors Affecting Communication: Hearing impaired  Cognition Arousal: Alert Behavior During Therapy: Restless   PT - Cognitive impairments: Awareness, Problem solving, Safety/Judgement, Orientation, Attention  PT - Cognition Comments: pt hallucinating throughout session, oriented to self, location, and time but states he is here because "I am going to be executed" and "my wife and I were involved in a bank robbery". Pt states there is smoke in the room, is talking to people who aren't present Following commands: Impaired Following  commands impaired: Follows one step commands inconsistently, Follows one step commands with increased time    Cueing Cueing Techniques: Verbal cues, Tactile cues  Exercises General Exercises - Lower Extremity Heel Slides: AAROM, Both, 10 reps, Supine Hip ABduction/ADduction: AAROM, Both, 10 reps, Supine Shoulder Exercises Elbow Flexion: AAROM, Both, 10 reps, Supine Elbow Extension: AAROM, Both, 10 reps, Supine    General Comments        Pertinent Vitals/Pain Pain Assessment Pain Assessment: Faces Faces Pain Scale: Hurts little more Pain Location: neck Pain Descriptors / Indicators: Grimacing, Guarding Pain Intervention(s): Limited activity within patient's tolerance, Monitored during session, Repositioned    Home Living                          Prior Function            PT Goals (current goals can now be found in the care plan section) Acute Rehab PT Goals Patient Stated Goal: home PT Goal Formulation: With patient Time For Goal Achievement: 10/17/23 Potential to Achieve Goals: Good Progress towards PT goals: Progressing toward goals    Frequency    Min 2X/week      PT Plan      Co-evaluation              AM-PAC PT "6 Clicks" Mobility   Outcome Measure  Help needed turning from your back to your side while in a flat bed without using bedrails?: Total Help needed moving from lying on your back to sitting on the side of a flat bed without using bedrails?: Total Help needed moving to and from a bed to a chair (including a wheelchair)?: Total Help needed standing up from a chair using your arms (e.g., wheelchair or bedside chair)?: Total Help needed to walk in hospital room?: Total Help needed climbing 3-5 steps with a railing? : Total 6 Click Score: 6    End of Session Equipment Utilized During Treatment: Cervical collar Activity Tolerance: Patient tolerated treatment well Patient left: in bed;with call bell/phone within reach;with bed  alarm set;with nursing/sitter in room Nurse Communication: Mobility status PT Visit Diagnosis: Other abnormalities of gait and mobility (R26.89);Muscle weakness (generalized) (M62.81);Repeated falls (R29.6);Pain     Time: 1537-1601 PT Time Calculation (min) (ACUTE ONLY): 24 min  Charges:    $Therapeutic Activity: 8-22 mins $Self Care/Home Management: 8-22 PT General Charges $$ ACUTE PT VISIT: 1 Visit                     Marye Round, PT DPT Acute Rehabilitation Services Secure Chat Preferred  Office [phone]    Nadalee Neiswender Sheliah Plane 10/08/2023, 4:39 PM

## 2023-10-08 NOTE — Progress Notes (Signed)
 Inpatient Rehab Admissions Coordinator:    CIR following. It appears Pt. Is unlikely to have surgery. Once they have made final decision, can consider transition to CIR.   Megan Salon, MS, CCC-SLP Rehab Admissions Coordinator  [phone] (celll) [phone] (office)

## 2023-10-09 DIAGNOSIS — Z7189 Other specified counseling: Secondary | ICD-10-CM | POA: Diagnosis not present

## 2023-10-09 DIAGNOSIS — Z515 Encounter for palliative care: Secondary | ICD-10-CM | POA: Diagnosis not present

## 2023-10-09 DIAGNOSIS — F11921 Opioid use, unspecified with intoxication delirium: Secondary | ICD-10-CM

## 2023-10-09 DIAGNOSIS — W19XXXA Unspecified fall, initial encounter: Secondary | ICD-10-CM | POA: Diagnosis not present

## 2023-10-09 DIAGNOSIS — F05 Delirium due to known physiological condition: Secondary | ICD-10-CM

## 2023-10-09 LAB — GLUCOSE, CAPILLARY
Glucose-Capillary: 121 mg/dL — ABNORMAL HIGH (ref 70–99)
Glucose-Capillary: 145 mg/dL — ABNORMAL HIGH (ref 70–99)
Glucose-Capillary: 243 mg/dL — ABNORMAL HIGH (ref 70–99)
Glucose-Capillary: 154 mg/dL — ABNORMAL HIGH (ref 70–99)

## 2023-10-09 LAB — CBC
HCT: 28.2 % — ABNORMAL LOW (ref 39.0–52.0)
Hemoglobin: 9.5 g/dL — ABNORMAL LOW (ref 13.0–17.0)
MCH: 29.1 pg (ref 26.0–34.0)
MCHC: 33.7 g/dL (ref 30.0–36.0)
MCV: 86.5 fL (ref 80.0–100.0)
Platelets: 322 10*3/uL (ref 150–400)
RBC: 3.26 MIL/uL — ABNORMAL LOW (ref 4.22–5.81)
RDW: 13.8 % (ref 11.5–15.5)
WBC: 10.4 10*3/uL (ref 4.0–10.5)
nRBC: 0 % (ref 0.0–0.2)

## 2023-10-09 LAB — COMPREHENSIVE METABOLIC PANEL WITH GFR
ALT: 23 U/L (ref 0–44)
AST: 34 U/L (ref 15–41)
Albumin: 2.5 g/dL — ABNORMAL LOW (ref 3.5–5.0)
Alkaline Phosphatase: 62 U/L (ref 38–126)
Anion gap: 13 (ref 5–15)
BUN: 55 mg/dL — ABNORMAL HIGH (ref 8–23)
CO2: 20 mmol/L — ABNORMAL LOW (ref 22–32)
Calcium: 8.8 mg/dL — ABNORMAL LOW (ref 8.9–10.3)
Chloride: 104 mmol/L (ref 98–111)
Creatinine, Ser: 2.26 mg/dL — ABNORMAL HIGH (ref 0.61–1.24)
GFR, Estimated: 28 mL/min — ABNORMAL LOW (ref >60)
Glucose, Bld: 124 mg/dL — ABNORMAL HIGH (ref 70–99)
Potassium: 3.6 mmol/L (ref 3.5–5.1)
Sodium: 137 mmol/L (ref 135–145)
Total Bilirubin: 0.7 mg/dL (ref 0.0–1.2)
Total Protein: 6.1 g/dL — ABNORMAL LOW (ref 6.5–8.1)

## 2023-10-09 MED ORDER — CHLORHEXIDINE GLUCONATE CLOTH 2 % EX PADS
6.0000 | MEDICATED_PAD | Freq: Every day | CUTANEOUS | Status: DC
  Administered 2023-10-10 – 2023-10-18 (×8): 6 via TOPICAL

## 2023-10-09 MED ORDER — HYDROMORPHONE HCL 1 MG/ML IJ SOLN
0.2500 mg | INTRAMUSCULAR | Status: DC | PRN
  Administered 2023-10-09: 0.25 mg via INTRAVENOUS
  Filled 2023-10-09: qty 0.5

## 2023-10-09 MED ORDER — QUETIAPINE FUMARATE 25 MG PO TABS
12.5000 mg | ORAL_TABLET | ORAL | Status: DC
  Administered 2023-10-09: 12.5 mg via ORAL
  Filled 2023-10-09: qty 1

## 2023-10-09 MED ORDER — CHLORHEXIDINE GLUCONATE CLOTH 2 % EX PADS: 6.0000 | MEDICATED_PAD | Freq: Every day | CUTANEOUS | Status: DC

## 2023-10-09 MED ORDER — QUETIAPINE FUMARATE 25 MG PO TABS
25.0000 mg | ORAL_TABLET | Freq: Every day | ORAL | Status: DC
  Administered 2023-10-09: 25 mg via ORAL
  Filled 2023-10-09: qty 1

## 2023-10-09 MED ORDER — HALOPERIDOL LACTATE 5 MG/ML IJ SOLN
2.0000 mg | Freq: Three times a day (TID) | INTRAMUSCULAR | Status: DC | PRN
  Administered 2023-10-09: 2 mg via INTRAVENOUS
  Filled 2023-10-09: qty 1

## 2023-10-09 NOTE — Progress Notes (Signed)
 PROGRESS NOTE    Christian Bailey  ZOX:[id number] DOB: April 26, 1941 DOA: 10/02/2023 PCP: Avva, Ravisankar, MD    Brief Narrative:  83 year old with history of peripheral artery disease on Plavix and Crestor, right bundle branch block, chronic diastolic heart failure, CKD stage IV, ambulatory dysfunction, hypertension,  hyperlipidemia,  type 2 diabetes, chronic anxiety and depression and GERD presented to the emergency room with a fall.  Patient reportedly with worsening mobility since last 6 months.  In the emergency room hemodynamically stable.  Imaging consistent with posterior spinous ligament injury from C2-C5.  Difficult to tolerate pain medications and getting delirious with narcotics.  Followed by neurosurgery.  Also with leukocytosis.  Subjective: Patient seen and examined.  Did not sleep all night.  Delirious, hallucinating.  Unable to be redirected.  Came back to examine patient with wife at the bedside and he is continuing to talk without making any sense. Started patient on Seroquel.  Discussed with psychiatry for consultation.  Assessment & Plan:   Acute metabolic encephalopathy likely secondary to polypharmacy and narcotics: Hyperactive delirium secondary to multiple factors including hospital-acquired delirium.  History of delirium in 2022. Cervical myelopathy with spasticity.  Mental status waxes and wanes.  Today with florid delirium symptoms. Discontinue all long-acting narcotics. Tylenol 1 g every 6 hours around-the-clock.  Low-dose Dilaudid IV to help with his pain.  Patient has significant neck pain. Continue to work with PT OT.  Soft collar. Neurosurgery following, operative versus conservative management. Patient with intractable symptoms today, starting Seroquel 12.5 mg in the morning, 25 mg in the evening and Haldol 2 mg IV every 6 hours as needed.  QTc 506.  Will recheck EKG tomorrow.  Will check electrolytes. Fall precautions.  Delirium precautions.  Acute intractable  pain, peripheral arterial disease: Continue Cymbalta, Tylenol 1 g every 6 hours, Lyrica 50 mg twice daily, Flexeril 5 mg 3 times daily.  Bowel regimen.  Low-dose Dilaudid.  CKD stage IV: At about baseline.  Anxiety depression: On Cymbalta.  Leukocytosis: Patient presented with WBC count of 19,000.  Urine was clear.  No other clear source of infection.  Treated with Rocephin and azithromycin. WBC already trending down.   Procalcitonin 5.5.  WC count trending down. Cultures negative so far.  Normalized.  Completed antibiotics.  Essential hypertension: Blood pressure stable.  Holding lisinopril.  Resumed metoprolol.  Type 2 diabetes: Known A1c 8.  On sliding scale.  Goal of care: Patient continues to have waxing and waning symptoms.  Today he is with hyperactive delirium.  Patient is not a rehab candidate and not a surgical candidate at this time. Wife at the bedside, patient's son Genella Kendall on the phone.  We discussed in details.  Updated patient's current clinical status and poor mental status today and very difficult to control symptoms.  Even after improvement, patient will be poor candidate for surgery due to high risk of going to delirium and will have challenging postoperative rehab.  We agreed to start patient on atypical antipsychotics, starting on Seroquel and Haldol. Plan is to attempt rehab once his delirium improves and continue to follow-up with surgery.    DVT prophylaxis: SCDs Start: 10/02/23 2244   Code Status: DNR with limited intervention. Family Communication: Wife at the bedside.  Son on the phone. Disposition Plan: Status is: Inpatient PatientRemains inpatient appropriate because: Critically ill.  Active psychosis and delirium.     Consultants:  Neurosurgery Palliative care Psychiatry.  Procedures:  None  Antimicrobials:  Rocephin azithromycin 4/8--- 4/11  Objective: Vitals:   10/08/23 2151 10/09/23 0023 10/09/23 0720 10/09/23 0737  BP: 115/89 (!)  101/49  (!) 140/54  Pulse: (!) 105 99  (!) 108  Resp: 17 17  16   Temp: 97.8 F (36.6 C) 98.6 F (37 C)  98.7 F (37.1 C)  TempSrc: Oral Oral  Oral  SpO2: 97% 99%  96%  Weight:   66.4 kg   Height:        Intake/Output Summary (Last 24 hours) at 10/09/2023 1501 Last data filed at 10/09/2023 1400 Gross per 24 hour  Intake 600 ml  Output 1450 ml  Net -850 ml   Filed Weights   10/02/23 1509 10/08/23 0628 10/09/23 0720  Weight: 65 kg 65.4 kg 66.4 kg    Examination: In morning rounds  General exam: Anxious.  Actively hallucinating.  Seeing things.  Trying to reach out.  Restless. Respiratory system: Clear to auscultation. Respiratory effort normal. Cardiovascular system: S1 & S2 heard, RRR.  Gastrointestinal system: Soft.  Nontender.  Bowel sound present. Central nervous system: Alert and awake.  Oriented to himself.  Easily distracted.  Hyperactive.  Restless.  Actively hallucinating and mostly seeing fire.    Data Reviewed: I have personally reviewed following labs and imaging studies  CBC: Recent Labs  Lab 10/05/23 1430 10/06/23 0521 10/07/23 0449 10/08/23 0400 10/09/23 0439  WBC 19.1* 14.2* 13.6* 13.2* 10.4  HGB 11.8* 10.0* 9.8* 10.5* 9.5*  HCT 34.6* 29.8* 29.5* 32.0* 28.2*  MCV 85.6 87.1 88.3 88.9 86.5  PLT 303 271 282 319 322   Basic Metabolic Panel: Recent Labs  Lab 10/03/23 0558 10/05/23 1430 10/06/23 0521 10/07/23 0449 10/08/23 0400 10/09/23 0439  NA 142 132* 137 136 138 137  K 4.5 4.7 4.3 3.9 4.3 3.6  CL 108 99 103 103 107 104  CO2 24 21* 23 21* 17* 20*  GLUCOSE 121* 168* 124* 123* 147* 124*  BUN 38* 39* 46* 59* 51* 55*  CREATININE 2.23* 2.28* 2.43* 2.85* 2.36* 2.26*  CALCIUM 9.2 9.0 8.5* 8.4* 8.9 8.8*  MG 1.9  --   --   --   --   --   PHOS 3.8  --   --   --   --   --    GFR: Estimated Creatinine Clearance: 21.9 mL/min (A) (by C-G formula based on SCr of 2.26 mg/dL (H)). Liver Function Tests: Recent Labs  Lab 10/05/23 1430 10/06/23 0521  10/07/23 0449 10/08/23 0400 10/09/23 0439  AST 25 16 19 29  34  ALT 17 12 12 20 23   ALKPHOS 66 54 47 69 62  BILITOT 1.0 0.7 0.5 1.0 0.7  PROT 7.5 6.3* 5.8* 6.4* 6.1*  ALBUMIN 3.5 2.8* 2.4* 2.6* 2.5*   No results for input(s): "LIPASE", "AMYLASE" in the last 168 hours. No results for input(s): "AMMONIA" in the last 168 hours. Coagulation Profile: No results for input(s): "INR", "PROTIME" in the last 168 hours. Cardiac Enzymes: No results for input(s): "CKTOTAL", "CKMB", "CKMBINDEX", "TROPONINI" in the last 168 hours. BNP (last 3 results) No results for input(s): "PROBNP" in the last 8760 hours. HbA1C: No results for input(s): "HGBA1C" in the last 72 hours. CBG: Recent Labs  Lab 10/08/23 1113 10/08/23 1701 10/08/23 2259 10/09/23 0650 10/09/23 1131  GLUCAP 128* 109* 146* 121* 145*   Lipid Profile: No results for input(s): "CHOL", "HDL", "LDLCALC", "TRIG", "CHOLHDL", "LDLDIRECT" in the last 72 hours. Thyroid Function Tests: No results for input(s): "TSH", "T4TOTAL", "FREET4", "T3FREE", "THYROIDAB" in the last 72 hours.  Anemia Panel: No results for input(s): "VITAMINB12", "FOLATE", "FERRITIN", "TIBC", "IRON", "RETICCTPCT" in the last 72 hours. Sepsis Labs: Recent Labs  Lab 10/07/23 0449  PROCALCITON 5.56    Recent Results (from the past 240 hours)  Culture, blood (Routine X 2) w Reflex to ID Panel     Status: None (Preliminary result)   Collection Time: 10/05/23  2:30 PM   Specimen: BLOOD  Result Value Ref Range Status   Specimen Description BLOOD BLOOD LEFT HAND  Final   Special Requests   Final    BOTTLES DRAWN AEROBIC AND ANAEROBIC Blood Culture adequate volume   Culture   Final    NO GROWTH 4 DAYS Performed at [facility], [address] [place], [place] [address]    Report Status PENDING  Incomplete  Culture, blood (Routine X 2) w Reflex to ID Panel     Status: None (Preliminary result)   Collection Time: 10/05/23  2:30 PM   Specimen: BLOOD LEFT HAND   Result Value Ref Range Status   Specimen Description BLOOD LEFT HAND  Final   Special Requests   Final    BOTTLES DRAWN AEROBIC AND ANAEROBIC Blood Culture adequate volume   Culture   Final    NO GROWTH 4 DAYS Performed at [facility], [address], [place], [place] [address]    Report Status PENDING  Incomplete         Radiology Studies: No results found.       Scheduled Meds:  acetaminophen  1,000 mg Oral Q6H   DULoxetine  20 mg Oral Daily   feeding supplement  237 mL Oral BID BM   ferrous sulfate  325 mg Oral Q breakfast   insulin aspart  0-5 Units Subcutaneous QHS   insulin aspart  0-9 Units Subcutaneous TID WC   metoprolol succinate  25 mg Oral Daily   mirabegron ER  50 mg Oral Daily   pantoprazole  40 mg Oral BID   pregabalin  50 mg Oral BID   QUEtiapine  12.5 mg Oral BH-q7a   QUEtiapine  25 mg Oral QHS   rosuvastatin  20 mg Oral QPM   Continuous Infusions:     LOS: 6 days    Time spent: 55 minutes    Vada Garibaldi, MD [facility]

## 2023-10-09 NOTE — Consult Note (Signed)
 [facility] Psychiatric Consult Initial  Patient Name: .Patsy Varma  MRN: [id number]  DOB: 12/21/1940  Consult Order details:  Orders (From admission, onward)     Start     Ordered   10/09/23 1110  IP CONSULT TO PSYCHIATRY       Ordering Provider: Vada Garibaldi, MD  Provider:  (Not yet assigned)  Question Answer Comment  Location [facility]   Reason for Consult? medication management , severe delirium      10/09/23 1109             Mode of Visit: In person    Psychiatry Consult Evaluation  Service Date: October 09, 2023 LOS:  LOS: 6 days  Chief Complaint the patient has no specific complaints.  The consult was requested due to confusion, delirium and auditory and visual hallucinations.  Primary Psychiatric Diagnoses  Acute delirium likely by medications/hospitalization. 2.  Auditory and tactile hallucinations. 3.  Rule out MCI.  Assessment  Unnamed Zeien is a 83 y.o. male admitted: Medicallyfor 10/02/2023  2:46 PM for delirium and agitation. He carries the psychiatric diagnoses of none and has a past medical history of please see H&P.   His current presentation of confusion and hallucinations is most consistent with delirium with psychosis.Aaron Aas He meets criteria for delirium possibly induced by opioids based on the fact that the patient was lucid prior to getting Dilaudid.Aaron Aas  He is currently quite hyperactive.  He required frequent redirection.  Please see below for recommendations.  Diagnoses:  Active Hospital problems: Principal Problem:   Fall Active Problems:   Intractable back pain   Delirium due to another medical condition, acute, hyperactive    Plan   ## Psychiatric Medication Recommendations:  Discontinue opioids and if possible avoid the use of opioids. He used Tramadol in the past. Given the hyperactive delirium consider use of low-dose Haldol between 0.5 to 2 mg given IV or IM.IN addition to seroquel  Monitor for EPS symptoms and QT  interval.  Unfortunately his QTc interval is 506 which is a high risk but Haldol can still be used with caution.  ## Medical Decision Making Capacity: Not specifically addressed in this encounter  ## Further Work-up:  -- see below TSH, B12, folate, EKG, or While pt on Qtc prolonging medications, please monitor & replete K+ to 4 and Mg2+ to 2 -- most recent EKG on 10/04/2023 had QtC of 506 -- Pertinent labwork reviewed earlier this admission includes: CBC and differential, urine,EKG.   ## Disposition:-- Plan Post Discharge/Psychiatric Care Follow-up resources if indicated.  ## Behavioral / Environmental: -Delirium Precautions: Delirium Interventions for Nursing and Staff:  - RN to open blinds every AM.  - To Bedside: Glasses, hearing aide, and pt's own shoes. Make available to patients. when possible and encourage use.  - Encourage po fluids when appropriate, keep fluids within reach.  - OOB to chair with meals.  - Passive ROM exercises to all extremities with AM & PM care.  - RN to assess orientation to person, time and place QAM and PRN.  - Recommend extended visitation hours with familiar family/friends as feasible.  - Staff to minimize disturbances at night. Turn off television when pt asleep or when not in use.    ## Safety and Observation Level:  - Based on my clinical evaluation, I estimate the patient to be at low risk of self harm in the current setting. - At this time, we recommend  1:1 Observation. This decision is based on  my review of the chart including patient's history and current presentation, interview of the patient, mental status examination, and consideration of suicide risk including evaluating suicidal ideation, plan, intent, suicidal or self-harm behaviors, risk factors, and protective factors. This judgment is based on our ability to directly address suicide risk, implement suicide prevention strategies, and develop a safety plan while the patient is in the clinical  setting. Please contact our team if there is a concern that risk level has changed.  CSSR Risk Category:C-SSRS RISK CATEGORY: No Risk  Suicide Risk Assessment: Patient has following modifiable risk factors for suicide: NONE, which we are addressing by None. Patient has following non-modifiable or demographic risk factors for suicide: male gender Patient has the following protective factors against suicide: Supportive family, Supportive friends, Cultural, spiritual, or religious beliefs that discourage suicide, and no history of suicide attempts  Thank you for this consult request. Recommendations have been communicated to the primary team.  We will continue to follow him at this time.  At this time.   Buzz Cass, MD       History of Present Illness  Relevant Aspects of [facility] Course:  Admitted on 10/02/2023 for history of a recent fall at home.Aaron Aas  Archer Moist is a 83 y.o. male with medical history significant for peripheral artery disease on Plavix and Crestor, RBBB, chronic HFpEF, CKD 4, ambulatory dysfunction (uses a walker at baseline), carotid stenosis, hypertension, hyperlipidemia, type 2 diabetes, chronic anxiety/depression, iron deficiency anemia, GERD, who presents to the ER after a fall.  The patient states he has had trouble with his balance for many years which he attributes to his peripheral vascular disease. .   The patient was admitted for workup and treatment including a possible spinous ligamental injury to C2-T5 as well as diffuse muscular strain in the area.  He was receiving  PT and OT According to the wife the patient's ambulation status has been deteriorating significantly over the last 6 months which resulted in this fall.  He was seen by neurosurgery and a spinal decompression was recommended.  Apparently the patient was clear and lucid in his communication until 10/08/2023.  According to the notes the patient started becoming confused and disorganized and  started hallucinating.  Apparently had received 1 dose of Dilaudid 0.25 mg.  Since 10/08/2023 the patient has been hallucinating throughout the day.  10/09/2023: On examination today the patient was lying in bed with a cervical collar and appeared disheveled disorganized and disoriented.  When I introduced myself he reported that " I saw your father" then he started rambling about being in her house and that he was 83 years old and that today was his birthday.  He lives with his wife and stated that she was his Public relations account executive.  He was not oriented to time place or person.  He appeared to be actively hallucinating and continues to ramble and remains disorganized. Both staff and wife report that he does occasionally have lucid intervals.  However these are few and far in between. When the patient was examined he was lying in a darkened room with the sheets drawn and the lights off and he seemed more disorganized.  Patient Report:  The patient is unable to engage in a formal mental status examination because of his delirium and psychosis.  Psych ROS:  Depression: None noted Anxiety: None Mania (lifetime and current): None Psychosis: (lifetime and current): 1 prior episode of agitation confused on an opioid.  Collateral information:  Contacted wife  who was present at bedside on 10/09/2023 She was able to provide additional information and reports that he has had no prior psychiatric history and never really drank to the point where he was having any problems with alcohol use.  He did completely stop using alcohol over 10 years ago.  He was in the [employer] for 28 years and after retirement has been living with his wife and had been declining gradually in his physical abilities including ambulation.  She reports that they have been married for over 58 years.  They have 2 children and there is no conflicts, prior history of psych problems, depression or anxiety.  However patient's wife reports that he had a  similar episode when he was given a narcotic for 3 to 4 years ago.  And he became quite disorganized which resolved over.  A few days he is unclear what medication he received at that time.  Review of Systems  Psychiatric/Behavioral:  Positive for hallucinations. The patient is nervous/anxious and has insomnia.      Psychiatric and Social History  Psychiatric History:  Information collected from wife.  The patient's wife denies any prior psychiatric history.  Prev Dx/Sx: None Current Psych Provider: None Home Meds (current): None Previous Med Trials: None Therapy: None  Prior Psych Hospitalization: Denies Prior Self Harm: Denies Prior Violence: Denies  Family Psych History: Denies Family Hx suicide: Denies  Social History:  Developmental Hx: WNL Educational Hx: Academic librarian Hx: Retired from the [employer] Hx: None Living Situation: Lives with spouse Spiritual Hx: N/A Access to weapons/lethal means: Unknown  Substance History: Wife reported no prior history of alcohol or substance abuse. Alcohol: Denies Type of alcohol denies Last Drink denies.  Apparently last drink was about 10 years ago. Number of drinks per day not applicable History of alcohol withdrawal seizures not applicable History of DT's not applicable Tobacco: Denies Illicit drugs: Denies Prescription drug abuse: Denies Rehab hx: Denies  Exam Findings  Physical Exam: Please see the H&P from the hospitalist Vital Signs:  Temp:  [97.5 F (36.4 C)-98.7 F (37.1 C)] 98.7 F (37.1 C) (04/12 0737) Pulse Rate:  [99-108] 108 (04/12 0737) Resp:  [16-17] 16 (04/12 0737) BP: (101-144)/(49-89) 140/54 (04/12 0737) SpO2:  [96 %-100 %] 96 % (04/12 0737) Weight:  [66.4 kg] 66.4 kg (04/12 0720) Blood pressure (!) 140/54, pulse (!) 108, temperature 98.7 F (37.1 C), temperature source Oral, resp. rate 16, height 5\' 5"  (1.651 m), weight 66.4 kg, SpO2 96%. Body mass index is 24.36 kg/m.  Physical Exam Please  see the H&P by the hospitalist.  Mental Status Exam: General Appearance: Disheveled  Orientation:  Negative  Memory:   Unable to test patient is a poor historian and is confused  Concentration:  Concentration: Poor and Attention Span: Poor  Recall:  Poor  Attention  Poor  Eye Contact:  Poor  Speech:  Garbled  Language:  Poor  Volume:  Normal  Mood: Very labile  Affect:  Labile and Full Range  Thought Process:  Disorganized  Thought Content:  Delusions and Hallucinations: Auditory Tactile  Suicidal Thoughts:  No  Homicidal Thoughts:  No  Judgement:  Poor  Insight:  Lacking  Psychomotor Activity:  Increased  Akathisia:  No  Fund of Knowledge:  Poor      Assets:  Housing Intimacy Resilience Social Support Talents/Skills  Cognition:  Impaired,  Severe  ADL's:  Impaired  AIMS (if indicated):        Other History  These have been pulled in through the EMR, reviewed, and updated if appropriate.  Family History:  The patient's family history includes Alcoholism in his brother; Diabetes type II in his brother; Esophageal cancer in his brother; Heart disease in his mother; Leukemia in his brother, brother, and mother; Liver disease in his brother; Lung disease in his sister; Stomach cancer in his father.  Medical History: Past Medical History:  Diagnosis Date   Anemia    Complication of anesthesia    Diabetes mellitus (HCC)    TYPE 2   GERD (gastroesophageal reflux disease)    History of blood transfusion    GI bleed   History of colon polyps    History of hiatal hernia    Hyperlipemia    Hypertension    Osteoarthritis    PAD (peripheral artery disease) (HCC)    a. stenting of his left common iliac artery >20 years ago. b. h/o LEIA stent and 2 stents to R SFA in 2011. c. 04/2014:  s/p PTA of right SFA for in-stent restenosis, occluded left SFA   PONV (postoperative nausea and vomiting)    no porblem with the last 3 surgeries   RBBB (right bundle branch block with  left anterior fascicular block)    NUCLEAR STRESS TEST, 08/18/2010 - no significant wall motion abnoramlities noted, post-stress EF 69%, normal myocardial perfusion study   Sinus tachycardia    a. Noted during admission 04/2014 but upon review seems to be frequent finding for patient.   Stenosis of carotid artery    a. 50% right carotid stenosis by angiogram 04/2014.    Surgical History: Past Surgical History:  Procedure Laterality Date   ABDOMINAL AORTOGRAM W/LOWER EXTREMITY N/A 01/27/2017   Procedure: Abdominal Aortogram w/Lower Extremity;  Surgeon: Margherita Shell, MD;  Location: [facility] INVASIVE CV LAB;  Service: Cardiovascular;  Laterality: N/A;   ABDOMINAL AORTOGRAM W/LOWER EXTREMITY N/A 06/08/2017   Procedure: ABDOMINAL AORTOGRAM W/LOWER EXTREMITY;  Surgeon: Margherita Shell, MD;  Location: [facility] INVASIVE CV LAB;  Service: Cardiovascular;  Laterality: N/A;   ABDOMINAL AORTOGRAM W/LOWER EXTREMITY N/A 08/31/2017   Procedure: ABDOMINAL AORTOGRAM W/LOWER EXTREMITY;  Surgeon: Margherita Shell, MD;  Location: [facility]VAS[facility] CV LAB;  Service: Cardiovascular;  Laterality: N/A;  rt. unilateral   ANGIOPLASTY / STENTING FEMORAL     ANGIOPLASTY / STENTING ILIAC     AORTA - BILATERAL FEMORAL ARTERY BYPASS GRAFT N/A 07/06/2018   Procedure: AORTA BIFEMORAL BYPASS GRAFT USING 14X7MM X 40CM HEMASHIELD GOLD GRAFT;  Surgeon: Margherita Shell, MD;  Location: [facility] OR;  Service: Vascular;  Laterality: N/A;   CEREBRAL ANGIOGRAM N/A 05/14/2014   Procedure: CEREBRAL ANGIOGRAM;  Surgeon: Avanell Leigh, MD;  Location: [facility] CATH LAB;  Service: Cardiovascular;  Laterality: N/A;   COLONOSCOPY W/ POLYPECTOMY     ENDARTERECTOMY Right 09/08/2017   ENDARTERECTOMY FEMORAL Right 09/08/2017   Procedure: REDO RIGHT FEMORAL ENDARTECTOMY WITH PATCH ANGIOPLASTY.;  Surgeon: Margherita Shell, MD;  Location: [facility] OR;  Service: Vascular;  Laterality: Right;   EYE SURGERY Bilateral    cataract   FEMORAL ARTERY STENT Right 05/12/2010   Stented  distally with a 6x100 Abbott absolute stent and proximally with a 6x60 Cook Zilver stent resulting in the reduction of the proximal segment 80% and mid segment 60-70% to 0% residual, LEFT common femoral artery stented with a 7x3 Smart stent resulting in reduction of 90% stenosis to 0% residual   FEMORAL-POPLITEAL BYPASS GRAFT Right 09/18/2016   Procedure: BYPASS GRAFT FEMORAL-POPLITEAL  ARTERY;  Surgeon: Margherita Shell, MD;  Location: [facility] OR;  Service: Vascular;  Laterality: Right;   FEMORAL-POPLITEAL BYPASS GRAFT Right 09/08/2017   Procedure: REDO BYPASS GRAFT FEMORAL-POPLITEAL ARTERY;  Surgeon: Margherita Shell, MD;  Location: [facility] OR;  Service: Vascular;  Laterality: Right;   FEMORAL-POPLITEAL BYPASS GRAFT Right 07/06/2018   Procedure: REVISION RIGHT FEMORAL TO POPLITEAL ARTERY BYPASS GRAFT;  Surgeon: Margherita Shell, MD;  Location: [facility] OR;  Service: Vascular;  Laterality: Right;   IR CHOLANGIOGRAM EXISTING TUBE  08/04/2018   IR EXCHANGE BILIARY DRAIN  01/22/2019   IR PERC CHOLECYSTOSTOMY  07/19/2018   IR RADIOLOGIST EVAL & MGMT  08/31/2018   IR RADIOLOGIST EVAL & MGMT  01/17/2019   IR RADIOLOGIST EVAL & MGMT  02/01/2019   IR RADIOLOGIST EVAL & MGMT  02/08/2019   KNEE ARTHROSCOPY     left   LOWER EXTREMITY ANGIOGRAM N/A 05/14/2014   Procedure: LOWER EXTREMITY ANGIOGRAM;  Surgeon: Avanell Leigh, MD;  Location: [facility] CATH LAB;  Service: Cardiovascular;  Laterality: N/A;   LOWER EXTREMITY ANGIOGRAPHY N/A 09/21/2016   Procedure: Lower Extremity Angiography;  Surgeon: Arvil Lauber, MD;  Location: [facility] INVASIVE CV LAB;  Service: Cardiovascular;  Laterality: N/A;   PERIPHERAL VASCULAR ATHERECTOMY Right 01/27/2017   Procedure: PERIPHERAL VASCULAR ATHERECTOMY;  Surgeon: Margherita Shell, MD;  Location: [facility];  Service: Cardiovascular;  Laterality: Right;   PERIPHERAL VASCULAR BALLOON ANGIOPLASTY Right 06/08/2017   Procedure: PERIPHERAL VASCULAR BALLOON ANGIOPLASTY;  Surgeon: Margherita Shell, MD;  Location:  [facility];  Service: Cardiovascular;  Laterality: Right;  common femoral and superficial femoral arteries   PERIPHERAL VASCULAR CATHETERIZATION N/A 04/20/2016   Procedure: Lower Extremity Intervention;  Surgeon: Avanell Leigh, MD;  Location: [facility] INVASIVE CV LAB;  Service: Cardiovascular;  Laterality: N/A;   PERIPHERAL VASCULAR INTERVENTION  08/31/2017   Procedure: PERIPHERAL VASCULAR INTERVENTION;  Surgeon: Margherita Shell, MD;  Location: [facility]VAS[facility] CV LAB;  Service: Cardiovascular;;  REIA   REVERSE SHOULDER ARTHROPLASTY Right 12/07/2016   REVERSE SHOULDER ARTHROPLASTY Right 12/07/2016   Procedure: REVERSE SHOULDER ARTHROPLASTY;  Surgeon: Winston Hawking, MD;  Location: [facility] OR;  Service: Orthopedics;  Laterality: Right;   ROTATOR CUFF REPAIR Right 2003   SFA Right 05/14/2014   PTA  OF RT SFA         DR BERRY     Medications:   Current Facility-Administered Medications:    acetaminophen (TYLENOL) tablet 1,000 mg, 1,000 mg, Oral, Q6H, Ghimire, Kuber, MD, 1,000 mg at 10/09/23 0653   cyclobenzaprine (FLEXERIL) tablet 5 mg, 5 mg, Oral, TID PRN, Haydee Lipa, MD, 5 mg at 10/08/23 2143   DULoxetine (CYMBALTA) DR capsule 20 mg, 20 mg, Oral, Daily, Hall, Carole N, DO, 20 mg at 10/09/23 4098   feeding supplement (ENSURE ENLIVE / ENSURE PLUS) liquid 237 mL, 237 mL, Oral, BID BM, Haydee Lipa, MD, 237 mL at 10/09/23 1191   ferrous sulfate tablet 325 mg, 325 mg, Oral, Q breakfast, Reesa Cannon N, DO, 325 mg at 10/09/23 4782   HYDROmorphone (DILAUDID) injection 0.25 mg, 0.25 mg, Intravenous, Q3H PRN, Ghimire, Kuber, MD, 0.25 mg at 10/09/23 1133   insulin aspart (novoLOG) injection 0-5 Units, 0-5 Units, Subcutaneous, QHS, Hall, Carole N, DO   insulin aspart (novoLOG) injection 0-9 Units, 0-9 Units, Subcutaneous, TID WC, Hall, Carole N, DO, 1 Units at 10/09/23 1139   melatonin tablet 5 mg, 5 mg, Oral, QHS PRN, Bary Boss, DO,  5 mg at 10/08/23 2143   metoprolol succinate  (TOPROL-XL) 24 hr tablet 25 mg, 25 mg, Oral, Daily, Ghimire, Kuber, MD, 25 mg at 10/09/23 0821   mirabegron ER (MYRBETRIQ) tablet 50 mg, 50 mg, Oral, Daily, Haydee Lipa, MD, 50 mg at 10/09/23 0821   pantoprazole (PROTONIX) EC tablet 40 mg, 40 mg, Oral, BID, Hall, Carole N, DO, 40 mg at 10/09/23 0821   polyethylene glycol (MIRALAX / GLYCOLAX) packet 17 g, 17 g, Oral, Daily PRN, Hall, Carole N, DO   pregabalin (LYRICA) capsule 50 mg, 50 mg, Oral, BID, Haydee Lipa, MD, 50 mg at 10/09/23 0821   prochlorperazine (COMPAZINE) injection 5 mg, 5 mg, Intravenous, Q6H PRN, Hall, Carole N, DO   QUEtiapine (SEROQUEL) tablet 12.5 mg, 12.5 mg, Oral, BH-q7a, Ghimire, Kuber, MD, 12.5 mg at 10/09/23 1134   QUEtiapine (SEROQUEL) tablet 25 mg, 25 mg, Oral, QHS, Ghimire, Kuber, MD   rosuvastatin (CRESTOR) tablet 20 mg, 20 mg, Oral, QPM, Hall, Carole N, DO, 20 mg at 10/08/23 1731  Allergies: Allergies  Allergen Reactions   Asa [Aspirin] Other (See Comments)    GI bleeding   Gadolinium Derivatives Itching, Swelling and Other (See Comments)    Pt complained of face flushing/hottness and throat tightness/scratchiness immediately after the injections.  Within 4 minutes, all symptoms were gone and the study was completed.  No further complications or signs of allergy were exhibited after completion of study.    Iodinated Contrast Media Other (See Comments)    Pt does not recall reaction   Pneumococcal 13-Val Conj Vacc     Other Reaction(s): achiness all over, dizziness, nausea, weakness   Scopolamine     Other Reaction(s): Delerium   Oxycodone-Acetaminophen Other (See Comments)    Dizziness and feeling of being uncomfortable     Buzz Cass, MD

## 2023-10-09 NOTE — Progress Notes (Signed)
 Pt has been confused since start of shift. He knows his name and DOB. Doesn't not know how old he is "thinks 69 or 38" nor where he is or why. Pt has been reoriented quite frequently overnight but still remains confused and having visual and auditory hallucinations. Pt sees people and places that do not exist in his room. Pt began with talks of guns and murders, told this RN to go outside and commit suicide. This RN tried to clarify what pt was stating and he he said "he was going to"..(indicating suicide) "as well". Gently spoke with pt stating he was safe in the hospital. Pt constantly talking to himself, mostly inappropriate words and gibberish as he doesn't make sense often.  Pt states he can't move his legs much as they are very rigid but more movement on L foot compared to R when tickled on plantar surface. When confronted with specific questions, there are moments of clarity with sound answers, however the next statements after do not make sense and pt starts to ramble again with pressured speech. Pt is restless and fidgets with cardiac monitor and blankets throughout the night. Pt has not slept at all.  Pt continues to be unable to void although when asked directly, he has stated twice the need to void with one attempt at using the urinal. However, no void and no incontinence. Pt has required I& O cath overnight.

## 2023-10-09 NOTE — Progress Notes (Signed)
 Daily Progress Note   Patient Name: Christian Bailey       Date: 10/09/2023 DOB: November 27, 1940  Age: 83 y.o. MRN#: [id number] Attending Physician: Vada Garibaldi, MD Primary Care Physician: Avva, Ravisankar, MD Admit Date: 10/02/2023  Reason for Consultation/Follow-up: Establishing goals of care  Length of Stay: 6  Current Medications: Scheduled Meds:   acetaminophen  1,000 mg Oral Q6H   DULoxetine  20 mg Oral Daily   feeding supplement  237 mL Oral BID BM   ferrous sulfate  325 mg Oral Q breakfast   insulin aspart  0-5 Units Subcutaneous QHS   insulin aspart  0-9 Units Subcutaneous TID WC   metoprolol succinate  25 mg Oral Daily   mirabegron ER  50 mg Oral Daily   pantoprazole  40 mg Oral BID   pregabalin  50 mg Oral BID   QUEtiapine  12.5 mg Oral BH-q7a   QUEtiapine  25 mg Oral QHS   rosuvastatin  20 mg Oral QPM    Continuous Infusions:   PRN Meds: cyclobenzaprine, haloperidol lactate, HYDROmorphone (DILAUDID) injection, melatonin, polyethylene glycol  Physical Exam Vitals reviewed.  Constitutional:      General: He is not in acute distress.    Appearance: He is ill-appearing.  Neurological:     Mental Status: He is alert. He is disoriented.  Psychiatric:        Attention and Perception: He perceives auditory and visual hallucinations.        Behavior: Behavior is hyperactive.             Vital Signs: BP (!) 140/54 (BP Location: Left Arm)   Pulse (!) 108   Temp 98.7 F (37.1 C) (Oral)   Resp 16   Ht 5\' 5"  (1.651 m)   Wt 66.4 kg   SpO2 96%   BMI 24.36 kg/m  SpO2: SpO2: 96 % O2 Device: O2 Device: Room Air O2 Flow Rate: O2 Flow Rate (L/min): (P) 2 L/min     Patient Active Problem List   Diagnosis Date Noted   Delirium due to another medical condition, acute,  hyperactive 10/09/2023   Intractable back pain 10/03/2023   Fall 10/02/2023   Acute renal failure superimposed on stage 3b chronic kidney disease, unspecified acute renal failure type (HCC) 05/07/2023   Leukocytosis 05/07/2023   Exudative age-related  macular degeneration of left eye with inactive choroidal neovascularization (HCC) 03/25/2020   Exudative age-related macular degeneration of right eye with inactive choroidal neovascularization (HCC) 03/25/2020   Advanced nonexudative age-related macular degeneration of right eye with subfoveal involvement 03/25/2020   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 involvement 03/25/2020   Bacteremia due to Gram-negative bacteria 01/19/2019   CKD (chronic kidney disease) stage 3, GFR 30-59 ml/min (HCC) 01/19/2019   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) 07/06/2018   S/P shoulder replacement, right 12/07/2016   PVD (peripheral vascular disease) (HCC) 09/18/2016   Sinus tachycardia 05/15/2014   Carotid artery disease (HCC) 04/26/2014   PAD (peripheral artery disease) (HCC) 06/12/2013   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 male  with past medical history of peripheral artery disease on Plavix and Crestor, right bundle branch block, chronic diastolic heart failure, CKD stage IV, ambulatory dysfunction, hypertension hyperlipidemia type 2 diabetes, chronic anxiety and depression and GERD admitted on 10/02/2023 with fall and head injury.    In the ED, MRI cervical spine without contrast showed edematous changes along the posterior spinous  ligament from C2-C5 compatible with acute ligamentous injury.  Diffuse edematous changes within the posterior paraspinous musculature bilaterally throughout the cervical spine compatible with muscle strain.  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  Moderate foraminal stenosis bilaterally at C6-7 and on the right at C7-T1.  Mild foraminal narrowing bilaterally at C2-3 and C3-4.  He was also found to have AKI on CKD 4.   Family has been discussing option of surgical decompression with neurosurgeon. PMT has been consulted to assist with goals of care conversation.  Today's Discussion: Patient sitting upright in recliner in NAD. He is confused with visual hallucinations. He is moving his arms a lot as he is talking gibberish. Wife is at bedside. She shares that she is very concerned with his delirium. He has not slept in some time. He will quiet down for a few minutes but will then get agitated again. He has brief moments of clarity. He is hard to redirect. Patient's wife shares that psychiatry consulted today and that Dr. Hilton Lucky has added Seroquel to his scheduled medications. We discussed delirium for some time. He had a period of delirium in the past after a cardiovascular procedure.   Patient and his family face treatment option decisions and anticipatory care needs. Patient's wife shared that it is hard to discuss goals of care and options moving forward with so much uncertainty around his mental status and delirium. She has decided to focus on time for outcomes for his delirium. Then will discuss best options moving forward.  Discussed the importance of continued conversation with family and the medical providers regarding overall plan of care and treatment options, ensuring decisions are within the context of the patient's values and GOCs.  Questions and concerns were addressed. The family was encouraged to  call with questions or concerns. PMT will continue to support holistically.  Recommendations/Plan: DNR/DNI Continue current care Time for outcomes for delirium Outpatient palliative follow  up at discharge Continued PMT support   Code Status:    Code Status Orders  (From admission, onward)           Start     Ordered   10/08/23 1211  Do not attempt resuscitation (DNR)- Limited -Do Not Intubate (DNI)  (Code Status)  Continuous       Question Answer Comment  If pulseless and not breathing No CPR or chest compressions.   In Pre-Arrest Conditions (Patient Is Breathing and Has A Pulse) Do not intubate. Provide all appropriate non-invasive medical interventions. Avoid ICU transfer unless indicated or required.   Consent: Discussion documented in EHR or advanced directives reviewed      10/08/23 1210        Extensive chart review has been completed prior to seeing the patient including labs, vital signs, imaging, progress/consult notes, orders, medications, and available advance directive documents.   Care plan was discussed with Dr. Hilton Lucky  Time spent: 50 minutes  Thank you for allowing the Palliative Medicine Team to assist in the care of this patient.    Daina Drum, NP  Please contact Palliative Medicine Team phone at [phone] for questions and concerns.

## 2023-10-09 NOTE — Progress Notes (Signed)
 Patient awake throughout day and continues to hallucinate. HR sustaining in the 130's. MD Ghimire aware. New order for EKG.

## 2023-10-10 DIAGNOSIS — W19XXXA Unspecified fall, initial encounter: Secondary | ICD-10-CM | POA: Diagnosis not present

## 2023-10-10 DIAGNOSIS — Z7189 Other specified counseling: Secondary | ICD-10-CM | POA: Diagnosis not present

## 2023-10-10 DIAGNOSIS — Z515 Encounter for palliative care: Secondary | ICD-10-CM | POA: Diagnosis not present

## 2023-10-10 LAB — CULTURE, BLOOD (ROUTINE X 2)
Culture: NO GROWTH
Culture: NO GROWTH
Special Requests: ADEQUATE
Special Requests: ADEQUATE

## 2023-10-10 LAB — GLUCOSE, CAPILLARY
Glucose-Capillary: 149 mg/dL — ABNORMAL HIGH (ref 70–99)
Glucose-Capillary: 158 mg/dL — ABNORMAL HIGH (ref 70–99)
Glucose-Capillary: 140 mg/dL — ABNORMAL HIGH (ref 70–99)
Glucose-Capillary: 131 mg/dL — ABNORMAL HIGH (ref 70–99)

## 2023-10-10 MED ORDER — HALOPERIDOL LACTATE 5 MG/ML IJ SOLN: 2.0000 mg | Freq: Four times a day (QID) | INTRAMUSCULAR | Status: DC | PRN

## 2023-10-10 MED ORDER — METOPROLOL SUCCINATE ER 50 MG PO TB24
50.0000 mg | ORAL_TABLET | Freq: Every day | ORAL | Status: DC
  Administered 2023-10-10 – 2023-10-18 (×8): 50 mg via ORAL
  Filled 2023-10-10 (×8): qty 1

## 2023-10-10 NOTE — Progress Notes (Signed)
 SLP Cancellation Note  Patient Details Name: Steadman Prosperi MRN: [id number] DOB: 04/21/1941   Cancelled swallowing assessment: Attempted x2 to see pt for swallowing eval; first time having bath, second sleeping soundly and asked by wife to return later. Will continue efforts.        Lillieann Pavlich L. Beatris Lincoln, MA CCC/SLP Clinical Specialist - Acute Care SLP Acute Rehabilitation Services Office number [phone]      Myna Asal Laurice 10/10/2023, 12:22 PM

## 2023-10-10 NOTE — Progress Notes (Signed)
 Patient awake and oriented x4 this afternoon. MD Ghimire notified.

## 2023-10-10 NOTE — Progress Notes (Signed)
 PROGRESS NOTE    Christian Bailey  ZOX:[id number] DOB: 09/22/1940 DOA: 10/02/2023 PCP: Avva, Ravisankar, MD    Brief Narrative:  83 year old with history of peripheral artery disease on Plavix and Crestor, right bundle branch block, chronic diastolic heart failure, CKD stage IV, ambulatory dysfunction, hypertension,  hyperlipidemia,  type 2 diabetes, chronic anxiety and depression and GERD presented to the emergency room with a fall.  Patient reportedly with worsening mobility since last 6 months.  In the emergency room hemodynamically stable.  Imaging consistent with posterior spinous ligament injury from C2-C5.  Difficult to tolerate pain medications and getting delirious with narcotics.  Followed by neurosurgery.  Also with leukocytosis.  Subjective: Patient seen and examined.  After so much of hyperactivity and hallucination all day, he slept last night with dose of Seroquel.  He was still sleepy and looked comfortable.  Did not wake him up.  Called and discussed, updated patient's wife.  Still remains with sinus tachycardia. Case discussed with psychiatry.  Will hold off on Seroquel and use haloperidol as needed. Required more than 5 times in and out cath, Foley catheter placed.  Assessment & Plan:   Acute metabolic encephalopathy likely secondary to polypharmacy and narcotics: Hyperactive delirium secondary to multiple factors including hospital-acquired delirium.  History of delirium in 2022. Cervical myelopathy with spasticity.  Mental status waxes and wanes.  Had developed severe delirium and hallucination.  Responded to haloperidol and Seroquel at night. Avoiding all sedative  Tylenol 1 g every 6 hours around-the-clock.  Avoiding Dilaudid today. Continue to work with PT OT.  Soft collar. Neurosurgery following, operative versus conservative management. Started on Seroquel 4/12.  More sedated today.  Discontinue Seroquel.  Keep on short acting haloperidol IV for symptom control. EKG  with prolonged QTc.  Shaking artifact.  Once he is more calm, will need to repeat EKG.  Electrolytes are adequate. Fall precautions.  Delirium precautions.  Acute intractable pain, peripheral arterial disease: Continue Cymbalta, Tylenol 1 g every 6 hours, Lyrica 50 mg twice daily, Flexeril 5 mg 3 times daily.  Bowel regimen.    CKD stage IV: At about baseline.  Anxiety depression: On Cymbalta.  Leukocytosis: Patient presented with WBC count of 19,000.  Urine was clear.  No other clear source of infection.  Treated with Rocephin and azithromycin. WBC already trending down.   Procalcitonin 5.5.  WC count trending down. Cultures negative so far.  Normalized.  Completed antibiotics.  Essential hypertension: Blood pressure stable.  Holding lisinopril.  Resumed metoprolol.  Type 2 diabetes: Known A1c 8.  On sliding scale.  Goal of care: Patient continues to have waxing and waning symptoms.  Patient is not a rehab candidate and not a surgical candidate at this time. 4/12 Wife at the bedside, patient's son Genella Kendall on the phone.  We discussed in details.  Updated patient's current clinical status and poor mental status and very difficult to control symptoms.  Even after improvement, patient will be poor candidate for surgery due to high risk of going to delirium and will have challenging postoperative rehab.  Trying to stabilize mental status and looking forward to go to rehab whenever possible.    DVT prophylaxis: SCDs Start: 10/02/23 2244   Code Status: DNR with limited intervention. Family Communication: Wife at the bedside. Disposition Plan: Status is: Inpatient PatientRemains inpatient appropriate because: Critically ill.  Active psychosis and delirium.     Consultants:  Neurosurgery Palliative care Psychiatry.  Procedures:  None  Antimicrobials:  Rocephin azithromycin 4/8--- 4/11  Objective: Vitals:   10/09/23 2015 10/09/23 2349 10/10/23 0414 10/10/23 0852  BP: 123/67  (!) 146/49 (!) 120/54 134/62  Pulse: (!) 137 (!) 133 (!) 121 (!) 112  Resp: 18 18 16 16   Temp: 97.8 F (36.6 C) 98.7 F (37.1 C) 98.9 F (37.2 C) 99.4 F (37.4 C)  TempSrc: Oral Axillary Axillary   SpO2: 95% 92% 94% 100%  Weight:   65.1 kg   Height:        Intake/Output Summary (Last 24 hours) at 10/10/2023 1154 Last data filed at 10/10/2023 1036 Gross per 24 hour  Intake 598 ml  Output 1245 ml  Net -647 ml   Filed Weights   10/08/23 0628 10/09/23 0720 10/10/23 0414  Weight: 65.4 kg 66.4 kg 65.1 kg    Examination: In morning rounds  General exam: Anxious on awakening.  Mostly sleepy today. Respiratory system: Clear to auscultation. Respiratory effort normal. Cardiovascular system: S1 & S2 heard, RRR.  Gastrointestinal system: Soft.  Nontender.  Bowel sound present. Central nervous system: Mostly sleepy today.   Data Reviewed: I have personally reviewed following labs and imaging studies  CBC: Recent Labs  Lab 10/05/23 1430 10/06/23 0521 10/07/23 0449 10/08/23 0400 10/09/23 0439  WBC 19.1* 14.2* 13.6* 13.2* 10.4  HGB 11.8* 10.0* 9.8* 10.5* 9.5*  HCT 34.6* 29.8* 29.5* 32.0* 28.2*  MCV 85.6 87.1 88.3 88.9 86.5  PLT 303 271 282 319 322   Basic Metabolic Panel: Recent Labs  Lab 10/05/23 1430 10/06/23 0521 10/07/23 0449 10/08/23 0400 10/09/23 0439  NA 132* 137 136 138 137  K 4.7 4.3 3.9 4.3 3.6  CL 99 103 103 107 104  CO2 21* 23 21* 17* 20*  GLUCOSE 168* 124* 123* 147* 124*  BUN 39* 46* 59* 51* 55*  CREATININE 2.28* 2.43* 2.85* 2.36* 2.26*  CALCIUM 9.0 8.5* 8.4* 8.9 8.8*   GFR: Estimated Creatinine Clearance: 21.9 mL/min (A) (by C-G formula based on SCr of 2.26 mg/dL (H)). Liver Function Tests: Recent Labs  Lab 10/05/23 1430 10/06/23 0521 10/07/23 0449 10/08/23 0400 10/09/23 0439  AST 25 16 19 29  34  ALT 17 12 12 20 23   ALKPHOS 66 54 47 69 62  BILITOT 1.0 0.7 0.5 1.0 0.7  PROT 7.5 6.3* 5.8* 6.4* 6.1*  ALBUMIN 3.5 2.8* 2.4* 2.6* 2.5*   No  results for input(s): "LIPASE", "AMYLASE" in the last 168 hours. No results for input(s): "AMMONIA" in the last 168 hours. Coagulation Profile: No results for input(s): "INR", "PROTIME" in the last 168 hours. Cardiac Enzymes: No results for input(s): "CKTOTAL", "CKMB", "CKMBINDEX", "TROPONINI" in the last 168 hours. BNP (last 3 results) No results for input(s): "PROBNP" in the last 8760 hours. HbA1C: No results for input(s): "HGBA1C" in the last 72 hours. CBG: Recent Labs  Lab 10/09/23 0650 10/09/23 1131 10/09/23 1723 10/09/23 2106 10/10/23 0636  GLUCAP 121* 145* 243* 154* 149*   Lipid Profile: No results for input(s): "CHOL", "HDL", "LDLCALC", "TRIG", "CHOLHDL", "LDLDIRECT" in the last 72 hours. Thyroid Function Tests: No results for input(s): "TSH", "T4TOTAL", "FREET4", "T3FREE", "THYROIDAB" in the last 72 hours. Anemia Panel: No results for input(s): "VITAMINB12", "FOLATE", "FERRITIN", "TIBC", "IRON", "RETICCTPCT" in the last 72 hours. Sepsis Labs: Recent Labs  Lab 10/07/23 0449  PROCALCITON 5.56    Recent Results (from the past 240 hours)  Culture, blood (Routine X 2) w Reflex to ID Panel     Status: None   Collection Time: 10/05/23  2:30 PM   Specimen: BLOOD  Result Value Ref Range Status   Specimen Description BLOOD BLOOD LEFT HAND  Final   Special Requests   Final    BOTTLES DRAWN AEROBIC AND ANAEROBIC Blood Culture adequate volume   Culture   Final    NO GROWTH 5 DAYS Performed at [facility], [address], [place], [place] [address]    Report Status 10/10/2023 FINAL  Final  Culture, blood (Routine X 2) w Reflex to ID Panel     Status: None   Collection Time: 10/05/23  2:30 PM   Specimen: BLOOD LEFT HAND  Result Value Ref Range Status   Specimen Description BLOOD LEFT HAND  Final   Special Requests   Final    BOTTLES DRAWN AEROBIC AND ANAEROBIC Blood Culture adequate volume   Culture   Final    NO GROWTH 5 DAYS Performed at [facility]  [facility], [address] [place], [place] [address]    Report Status 10/10/2023 FINAL  Final         Radiology Studies: No results found.       Scheduled Meds:  acetaminophen  1,000 mg Oral Q6H   Chlorhexidine Gluconate Cloth  6 each Topical Daily   DULoxetine  20 mg Oral Daily   feeding supplement  237 mL Oral BID BM   ferrous sulfate  325 mg Oral Q breakfast   insulin aspart  0-5 Units Subcutaneous QHS   insulin aspart  0-9 Units Subcutaneous TID WC   metoprolol succinate  50 mg Oral Daily   mirabegron ER  50 mg Oral Daily   pantoprazole  40 mg Oral BID   pregabalin  50 mg Oral BID   rosuvastatin  20 mg Oral QPM   Continuous Infusions:     LOS: 7 days    Time spent: 55 minutes    Vada Garibaldi, MD [facility]

## 2023-10-10 NOTE — Progress Notes (Signed)
 Informed Denece Finger, NP that patient is yellow MEWS due to sustaining heart rate at 130's, patient is not agitated or in distress, per NP, to administer Night meds such as seroquel and Lyrica.  At 2230, updated Denece Finger NP that Heart rate is still sustaining at 130 1 hour post medication. Patient si still in no apparent distress, resting well and awake in bed.

## 2023-10-10 NOTE — Progress Notes (Signed)
 Daily Progress Note   Patient Name: Christian Bailey       Date: 10/10/2023 DOB: 1941/04/08  Age: 83 y.o. MRN#: [id number] Attending Physician: Vada Garibaldi, MD Primary Care Physician: Avva, Ravisankar, MD Admit Date: 10/02/2023  Reason for Consultation/Follow-up: Establishing goals of care  Length of Stay: 7  Current Medications: Scheduled Meds:   acetaminophen  1,000 mg Oral Q6H   Chlorhexidine Gluconate Cloth  6 each Topical Daily   DULoxetine  20 mg Oral Daily   feeding supplement  237 mL Oral BID BM   ferrous sulfate  325 mg Oral Q breakfast   insulin aspart  0-5 Units Subcutaneous QHS   insulin aspart  0-9 Units Subcutaneous TID WC   metoprolol succinate  50 mg Oral Daily   mirabegron ER  50 mg Oral Daily   pantoprazole  40 mg Oral BID   pregabalin  50 mg Oral BID   rosuvastatin  20 mg Oral QPM    Continuous Infusions:   PRN Meds: cyclobenzaprine, haloperidol lactate, melatonin, polyethylene glycol  Physical Exam Vitals reviewed.  Constitutional:      General: He is sleeping. He is not in acute distress.    Appearance: He is ill-appearing.  Neurological:     Mental Status: He is disoriented.  Psychiatric:        Attention and Perception: He perceives auditory and visual hallucinations.        Behavior: Behavior is hyperactive.             Vital Signs: BP (!) 142/63 (BP Location: Right Arm)   Pulse (!) 118   Temp 98.1 F (36.7 C)   Resp 16   Ht 5\' 5"  (1.651 m)   Wt 65.1 kg   SpO2 94%   BMI 23.88 kg/m  SpO2: SpO2: 94 % O2 Device: O2 Device: Room Air O2 Flow Rate: O2 Flow Rate (L/min): (P) 2 L/min     Patient Active Problem List   Diagnosis Date Noted   Delirium due to another medical condition, acute, hyperactive 10/09/2023   Intractable back pain  10/03/2023   Fall 10/02/2023   Acute renal failure superimposed on stage 3b chronic kidney disease, unspecified acute renal failure type (HCC) 05/07/2023   Leukocytosis 05/07/2023   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)  03/25/2020   Exudative age-related macular degeneration of right eye with inactive choroidal neovascularization (HCC) 03/25/2020   Advanced nonexudative age-related macular degeneration of right eye with subfoveal involvement 03/25/2020   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 involvement 03/25/2020   Bacteremia due to Gram-negative bacteria 01/19/2019   CKD (chronic kidney disease) stage 3, GFR 30-59 ml/min (HCC) 01/19/2019   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) 07/06/2018   S/P shoulder replacement, right 12/07/2016   PVD (peripheral vascular disease) (HCC) 09/18/2016   Sinus tachycardia 05/15/2014   Carotid artery disease (HCC) 04/26/2014   PAD (peripheral artery disease) (HCC) 06/12/2013   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 male  with past medical history of peripheral artery disease on Plavix and Crestor, right bundle branch block, chronic diastolic heart failure, CKD stage IV, ambulatory dysfunction, hypertension hyperlipidemia type 2 diabetes, chronic anxiety and depression and GERD admitted on 10/02/2023 with fall and head injury.    In the ED, MRI cervical spine without contrast showed edematous changes along the posterior spinous ligament from C2-C5 compatible with acute  ligamentous injury.  Diffuse edematous changes within the posterior paraspinous musculature bilaterally throughout the cervical spine compatible with muscle strain.  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  Moderate foraminal stenosis bilaterally at C6-7 and on the right at C7-T1.  Mild foraminal narrowing bilaterally at C2-3 and C3-4.  He was also found to have AKI on CKD 4.   Family has been discussing option of surgical decompression with neurosurgeon. PMT has been consulted to assist with goals of care conversation.  Today's Discussion: Patient sleeping in NAD. Did not wake him up and family not at bedside.  Addendum 16:40 Received update from nurse that patient is alert and oriented. Patient slept overnight and most of today. Wife is at bedside. Wife is relieved patient's mental status has improved. Encouraged patient and family to consider goals of care while patient can participate in discussion. Will have a PMT provider follow up tomorrow.  Discussed the importance of continued conversation with family and the medical providers regarding overall plan of care and treatment options, ensuring decisions are within the context of the patient's values and GOCs.  Questions and concerns were addressed. The family was encouraged to call with questions or concerns. PMT will continue to support holistically.  Recommendations/Plan: DNR/DNI Continue current care Time for outcomes for delirium Outpatient palliative follow up at discharge Continued PMT support   Code Status:    Code Status Orders  (From admission, onward)           Start     Ordered   10/08/23 1211  Do not attempt resuscitation (DNR)- Limited -Do Not Intubate (DNI)  (Code Status)  Continuous       Question Answer Comment  If pulseless and not breathing No CPR or chest compressions.   In Pre-Arrest Conditions (Patient Is  Breathing and Has A Pulse) Do not intubate. Provide all appropriate non-invasive medical interventions. Avoid ICU transfer unless indicated or required.   Consent: Discussion  documented in EHR or advanced directives reviewed      10/08/23 1210        Extensive chart review has been completed prior to seeing the patient including labs, vital signs, imaging, progress/consult notes, orders, medications, and available advance directive documents.   Care plan was discussed with bedside RN  Time spent: 25 minutes  Thank you for allowing the Palliative Medicine Team to assist in the care of this patient.    Daina Drum, NP  Please contact Palliative Medicine Team phone at [phone] for questions and concerns.

## 2023-10-10 NOTE — Plan of Care (Signed)
  Problem: Nutritional: Goal: Maintenance of adequate nutrition will improve Outcome: Not Progressing   Problem: Nutritional: Goal: Progress toward achieving an optimal weight will improve Outcome: Not Progressing   Problem: Metabolic: Goal: Ability to maintain appropriate glucose levels will improve Outcome: Not Progressing   Problem: Clinical Measurements: Goal: Will remain free from infection Outcome: Not Progressing   Problem: Clinical Measurements: Goal: Diagnostic test results will improve Outcome: Not Progressing   Problem: Activity: Goal: Risk for activity intolerance will decrease Outcome: Not Progressing   Problem: Elimination: Goal: Will not experience complications related to urinary retention Outcome: Not Progressing   Problem: Safety: Goal: Ability to remain free from injury will improve Outcome: Not Progressing   Problem: Skin Integrity: Goal: Risk for impaired skin integrity will decrease Outcome: Not Progressing

## 2023-10-10 NOTE — Consult Note (Addendum)
 [facility] Psychiatric Consult Initial  Patient Name: .Christian Bailey  MRN: [id number]  DOB: January 12, 1941  Consult Order details:  Orders (From admission, onward)     Start     Ordered   10/09/23 1110  IP CONSULT TO PSYCHIATRY       Ordering Provider: Vada Garibaldi, MD  Provider:  (Not yet assigned)  Question Answer Comment  Location [facility]   Reason for Consult? medication management , severe delirium      10/09/23 1109             Mode of Visit: In person    Psychiatry Consult Evaluation  Service Date: October 10, 2023 LOS:  LOS: 7 days  Chief Complaint the patient has no specific complaints.  The consult was requested due to confusion, delirium and auditory and visual hallucinations.  Primary Psychiatric Diagnoses  Acute delirium likely by medications/hospitalization. 2.  Auditory and tactile hallucinations. 3.  Rule out MCI.  Assessment  Christian Bailey is a 83 y.o. male admitted: Medicallyfor 10/02/2023  2:46 PM for delirium and agitation. He carries the psychiatric diagnoses of none and has a past medical history of please see H&P.   The patient was confused yesterday in addition to being hyperactive and mildly agitated.  However he seemed to have calmed down overnight and slept at least 7 hours without interruption.  He is less confused but still sedated.  Please see below for recommendations.  Diagnoses:  Active Hospital problems: Principal Problem:   Fall Active Problems:   Intractable back pain   Delirium due to another medical condition, acute, hyperactive    Plan   ## Psychiatric Medication Recommendations:  Discontinue opioids and if possible avoid the use of opioids. He used Tramadol in the past. Given the hyperactive delirium consider use of low-dose Haldol between 0.5 to 2 mg given IV or IM.consider discontinuation of Seroquel. Monitor for EPS symptoms and QT interval.  Unfortunately his QTcB interval is 570 on 10/09/2023.  Caution  with use of antipsychotics. Consider using Ativan PRN for agitation as indicated.  ## Medical Decision Making Capacity: Not specifically addressed in this encounter  ## Further Work-up:  -- see below TSH, B12, folate, EKG, or While pt on Qtc prolonging medications, please monitor & replete K+ to 4 and Mg2+ to 2 -- most recent EKG on 10/04/2023 had QtC of 506 -- Pertinent labwork reviewed earlier this admission includes: CBC and differential, urine,EKG.   ## Disposition:-- Plan Post Discharge/Psychiatric Care Follow-up resources if indicated.  ## Behavioral / Environmental: -Delirium Precautions: Delirium Interventions for Nursing and Staff:  - RN to open blinds every AM.  - To Bedside: Glasses, hearing aide, and pt's own shoes. Make available to patients. when possible and encourage use.  - Encourage po fluids when appropriate, keep fluids within reach.  - OOB to chair with meals.  - Passive ROM exercises to all extremities with AM & PM care.  - RN to assess orientation to person, time and place QAM and PRN.  - Recommend extended visitation hours with familiar family/friends as feasible.  - Staff to minimize disturbances at night. Turn off television when pt asleep or when not in use.    ## Safety and Observation Level:  - Based on my clinical evaluation, I estimate the patient to be at low risk of self harm in the current setting. - At this time, we recommend  1:1 Observation. This decision is based on my review of the chart including patient's  history and current presentation, interview of the patient, mental status examination, and consideration of suicide risk including evaluating suicidal ideation, plan, intent, suicidal or self-harm behaviors, risk factors, and protective factors. This judgment is based on our ability to directly address suicide risk, implement suicide prevention strategies, and develop a safety plan while the patient is in the clinical setting. Please contact our team  if there is a concern that risk level has changed.  CSSR Risk Category:C-SSRS RISK CATEGORY: No Risk  Suicide Risk Assessment: Patient has following modifiable risk factors for suicide: NONE, which we are addressing by None. Patient has following non-modifiable or demographic risk factors for suicide: male gender Patient has the following protective factors against suicide: Supportive family, Supportive friends, Cultural, spiritual, or religious beliefs that discourage suicide, and no history of suicide attempts  Thank you for this consult request. Recommendations have been communicated to the primary team.  We will continue to follow him at this time.  At this time.   Buzz Cass, MD       History of Present Illness  Relevant Aspects of [facility] Course:  Admitted on 10/02/2023 for history of a recent fall at home.Christian Bailey  Christian Bailey is a 83 y.o. male with medical history significant for peripheral artery disease on Plavix and Crestor, RBBB, chronic HFpEF, CKD 4, ambulatory dysfunction (uses a walker at baseline), carotid stenosis, hypertension, hyperlipidemia, type 2 diabetes, chronic anxiety/depression, iron deficiency anemia, GERD, who presents to the ER after a fall.  The patient states he has had trouble with his balance for many years which he attributes to his peripheral vascular disease. .   The patient was admitted for workup and treatment including a possible spinous ligamental injury to C2-T5 as well as diffuse muscular strain in the area.  He was receiving  PT and OT According to the wife the patient's ambulation status has been deteriorating significantly over the last 6 months which resulted in this fall.  He was seen by neurosurgery and a spinal decompression was recommended.  Apparently the patient was clear and lucid in his communication until 10/08/2023.  According to the notes the patient started becoming confused and disorganized and started hallucinating.  Apparently  had received 1 dose of Dilaudid 0.25 mg.  Since 10/08/2023 the patient has been hallucinating throughout the day.  10/09/2023: On examination today the patient was lying in bed with a cervical collar and appeared disheveled disorganized and disoriented.  When I introduced myself he reported that " I saw your father" then he started rambling about being in her house and that he was 83 years old and that today was his birthday.  He lives with his wife and stated that she was his Public relations account executive.  He was not oriented to time place or person.  He appeared to be actively hallucinating and continues to ramble and remains disorganized. Both staff and wife report that he does occasionally have lucid intervals.  However these are few and far in between. When the patient was examined he was lying in a darkened room with the sheets drawn and the lights off and he seemed more disorganized.    10/10/2023: The patient was seen today and the chart was reviewed and discussed progress with the charge nurse.  Patient apparently responded to the medication and did sleep last night and apparently slept for about 7 hours.  When seen today he was still sedated and sleeping and no agitation or hyperactivity was noted.  However, staff reports persistent  tachycardia in the 130s range.  And the EKG from 10/09/2023 shows a Qtc B at 570.  Patient Report:  The patient is unable to engage in a formal mental status examination because of his delirium and psychosis.  Psych ROS:  Depression: None noted Anxiety: None Mania (lifetime and current): None Psychosis: (lifetime and current): 1 prior episode of agitation confused on an opioid.  Collateral information:  Contacted wife who was present at bedside on 10/09/2023 She was able to provide additional information and reports that he has had no prior psychiatric history and never really drank to the point where he was having any problems with alcohol use.  He did completely stop  using alcohol over 10 years ago.  He was in the [employer] for 28 years and after retirement has been living with his wife and had been declining gradually in his physical abilities including ambulation.  She reports that they have been married for over 58 years.  They have 2 children and there is no conflicts, prior history of psych problems, depression or anxiety.  However patient's wife reports that he had a similar episode when he was given a narcotic for 3 to 4 years ago.  And he became quite disorganized which resolved over.  A few days he is unclear what medication he received at that time.  Review of Systems  Psychiatric/Behavioral:  Positive for hallucinations. The patient is nervous/anxious and has insomnia.      Psychiatric and Social History  Psychiatric History:  Information collected from wife.  The patient's wife denies any prior psychiatric history.  Prev Dx/Sx: None Current Psych Provider: None Home Meds (current): None Previous Med Trials: None Therapy: None  Prior Psych Hospitalization: Denies Prior Self Harm: Denies Prior Violence: Denies  Family Psych History: Denies Family Hx suicide: Denies  Social History:  Developmental Hx: WNL Educational Hx: Academic librarian Hx: Retired from the [employer] Hx: None Living Situation: Lives with spouse Spiritual Hx: N/A Access to weapons/lethal means: Unknown  Substance History: Wife reported no prior history of alcohol or substance abuse. Alcohol: Denies Type of alcohol denies Last Drink denies.  Apparently last drink was about 10 years ago. Number of drinks per day not applicable History of alcohol withdrawal seizures not applicable History of DT's not applicable Tobacco: Denies Illicit drugs: Denies Prescription drug abuse: Denies Rehab hx: Denies  Exam Findings  Physical Exam: Please see the H&P from the hospitalist Vital Signs:  Temp:  [97.8 F (36.6 C)-98.9 F (37.2 C)] 98.9 F (37.2 C) (04/13  0414) Pulse Rate:  [121-137] 121 (04/13 0414) Resp:  [16-18] 16 (04/13 0414) BP: (120-146)/(49-67) 120/54 (04/13 0414) SpO2:  [92 %-95 %] 94 % (04/13 0414) Weight:  [65.1 kg] 65.1 kg (04/13 0414) Blood pressure (!) 120/54, pulse (!) 121, temperature 98.9 F (37.2 C), temperature source Axillary, resp. rate 16, height 5\' 5"  (1.651 m), weight 65.1 kg, SpO2 94%. Body mass index is 23.88 kg/m.  Physical Exam Please see the H&P by the hospitalist.  Mental Status Exam: General Appearance: Casual and Disheveled  Orientation:  NA  Memory:  NA Unable to test patient is a poor historian and is confused  Concentration:  Concentration: Poor and Attention Span: Poor  Recall:  Poor  Attention  Poor  Eye Contact:  Poor  Speech:  Garbled  Language:  Poor  Volume:  Normal  Mood: Very labile  Affect:  Labile and Full Range  Thought Process:  Disorganized  Thought Content:  Delusions and  Hallucinations: Auditory Tactile  Suicidal Thoughts:  No  Homicidal Thoughts:  No  Judgement:  Poor  Insight:  Lacking  Psychomotor Activity:  Increased  Akathisia:  No  Fund of Knowledge:  Poor      Assets:  Housing Intimacy Resilience Social Support Talents/Skills  Cognition:  Impaired,  Severe  ADL's:  Impaired  AIMS (if indicated):        Other History   These have been pulled in through the EMR, reviewed, and updated if appropriate.  Family History:  The patient's family history includes Alcoholism in his brother; Diabetes type II in his brother; Esophageal cancer in his brother; Heart disease in his mother; Leukemia in his brother, brother, and mother; Liver disease in his brother; Lung disease in his sister; Stomach cancer in his father.  Medical History: Past Medical History:  Diagnosis Date   Anemia    Complication of anesthesia    Diabetes mellitus (HCC)    TYPE 2   GERD (gastroesophageal reflux disease)    History of blood transfusion    GI bleed   History of colon polyps     History of hiatal hernia    Hyperlipemia    Hypertension    Osteoarthritis    PAD (peripheral artery disease) (HCC)    a. stenting of his left common iliac artery >20 years ago. b. h/o LEIA stent and 2 stents to R SFA in 2011. c. 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 Noted during admission 04/2014 but upon review seems to be frequent finding for patient.   Stenosis of carotid artery    a. 50% right carotid stenosis by angiogram 04/2014.    Surgical History: Past Surgical History:  Procedure Laterality Date   ABDOMINAL AORTOGRAM W/LOWER EXTREMITY N/A 01/27/2017   Procedure: Abdominal Aortogram w/Lower Extremity;  Surgeon: Margherita Shell, MD;  Location: [facility] INVASIVE CV LAB;  Service: Cardiovascular;  Laterality: N/A;   ABDOMINAL AORTOGRAM W/LOWER EXTREMITY N/A 06/08/2017   Procedure: ABDOMINAL AORTOGRAM W/LOWER EXTREMITY;  Surgeon: Margherita Shell, MD;  Location: [facility] INVASIVE CV LAB;  Service: Cardiovascular;  Laterality: N/A;   ABDOMINAL AORTOGRAM W/LOWER EXTREMITY N/A 08/31/2017   Procedure: ABDOMINAL AORTOGRAM W/LOWER EXTREMITY;  Surgeon: Margherita Shell, MD;  Location: [facility] INVASIVE CV LAB;  Service: Cardiovascular;  Laterality: N/A;  rt. unilateral   ANGIOPLASTY / STENTING FEMORAL     ANGIOPLASTY / STENTING ILIAC     AORTA - BILATERAL FEMORAL ARTERY BYPASS GRAFT N/A 07/06/2018   Procedure: AORTA BIFEMORAL BYPASS GRAFT USING 14X7MM X 40CM HEMASHIELD GOLD GRAFT;  Surgeon: Margherita Shell, MD;  Location: [facility] OR;  Service: Vascular;  Laterality: N/A;   CEREBRAL ANGIOGRAM N/A 05/14/2014   Procedure: CEREBRAL ANGIOGRAM;  Surgeon: Avanell Leigh,  MD;  Location: [facility] CATH LAB;  Service: Cardiovascular;  Laterality: N/A;   COLONOSCOPY W/ POLYPECTOMY     ENDARTERECTOMY Right 09/08/2017   ENDARTERECTOMY FEMORAL Right 09/08/2017   Procedure: REDO RIGHT FEMORAL ENDARTECTOMY WITH PATCH ANGIOPLASTY.;  Surgeon: Margherita Shell, MD;  Location: [facility] OR;  Service: Vascular;  Laterality: Right;   EYE SURGERY Bilateral  cataract   FEMORAL ARTERY STENT Right 05/12/2010   Stented distally with a 6x100 Abbott absolute stent and proximally with a 6x60 Cook Zilver stent resulting in the reduction of the proximal segment 80% and mid segment 60-70% to 0% residual, LEFT common femoral artery stented with a 7x3 Smart stent resulting in reduction of 90% stenosis to 0% residual   FEMORAL-POPLITEAL BYPASS GRAFT Right 09/18/2016   Procedure: BYPASS GRAFT FEMORAL-POPLITEAL ARTERY;  Surgeon: Margherita Shell, MD;  Location: [facility] OR;  Service: Vascular;  Laterality: Right;   FEMORAL-POPLITEAL BYPASS GRAFT Right 09/08/2017   Procedure: REDO BYPASS GRAFT FEMORAL-POPLITEAL ARTERY;  Surgeon: Margherita Shell, MD;  Location: [facility] OR;  Service: Vascular;  Laterality: Right;   FEMORAL-POPLITEAL BYPASS GRAFT Right 07/06/2018   Procedure: REVISION RIGHT FEMORAL TO POPLITEAL ARTERY BYPASS GRAFT;  Surgeon: Margherita Shell, MD;  Location: [facility] OR;  Service: Vascular;  Laterality: Right;   IR CHOLANGIOGRAM EXISTING TUBE  08/04/2018   IR EXCHANGE BILIARY DRAIN  01/22/2019   IR PERC CHOLECYSTOSTOMY  07/19/2018   IR RADIOLOGIST EVAL & MGMT  08/31/2018   IR RADIOLOGIST EVAL & MGMT  01/17/2019   IR RADIOLOGIST EVAL & MGMT  02/01/2019   IR RADIOLOGIST EVAL & MGMT  02/08/2019   KNEE ARTHROSCOPY     left   LOWER EXTREMITY ANGIOGRAM N/A 05/14/2014   Procedure: LOWER EXTREMITY ANGIOGRAM;  Surgeon: Avanell Leigh, MD;  Location: [facility] CATH LAB;  Service: Cardiovascular;  Laterality: N/A;   LOWER EXTREMITY ANGIOGRAPHY N/A 09/21/2016   Procedure: Lower Extremity Angiography;  Surgeon: Arvil Lauber, MD;   Location: [facility] INVASIVE CV LAB;  Service: Cardiovascular;  Laterality: N/A;   PERIPHERAL VASCULAR ATHERECTOMY Right 01/27/2017   Procedure: PERIPHERAL VASCULAR ATHERECTOMY;  Surgeon: Margherita Shell, MD;  Location: [facility];  Service: Cardiovascular;  Laterality: Right;   PERIPHERAL VASCULAR BALLOON ANGIOPLASTY Right 06/08/2017   Procedure: PERIPHERAL VASCULAR BALLOON ANGIOPLASTY;  Surgeon: Margherita Shell, MD;  Location: [facility] CV [facility];  Service: Cardiovascular;  Laterality: Right;  common femoral and superficial femoral arteries   PERIPHERAL VASCULAR CATHETERIZATION N/A 04/20/2016   Procedure: Lower Extremity Intervention;  Surgeon: Avanell Leigh, MD;  Location: [facility] INVASIVE CV LAB;  Service: Cardiovascular;  Laterality: N/A;   PERIPHERAL VASCULAR INTERVENTION  08/31/2017   Procedure: PERIPHERAL VASCULAR INTERVENTION;  Surgeon: Margherita Shell, MD;  Location: [facility] INVASIVE CV LAB;  Service: Cardiovascular;;  REIA   REVERSE SHOULDER ARTHROPLASTY Right 12/07/2016   REVERSE SHOULDER ARTHROPLASTY Right 12/07/2016   Procedure: REVERSE SHOULDER ARTHROPLASTY;  Surgeon: Winston Hawking, MD;  Location: [facility] OR;  Service: Orthopedics;  Laterality: Right;   ROTATOR CUFF REPAIR Right 2003   SFA Right 05/14/2014   PTA  OF RT SFA         DR BERRY     Medications:   Current Facility-Administered Medications:    acetaminophen (TYLENOL) tablet 1,000 mg, 1,000 mg, Oral, Q6H, Ghimire, Kuber, MD, 1,000 mg at 10/09/23 1744   Chlorhexidine Gluconate Cloth 2 % PADS 6 each, 6 each, Topical, Daily, Ghimire, Kuber, MD   cyclobenzaprine (FLEXERIL) tablet 5 mg, 5 mg, Oral, TID PRN, Haydee Lipa, MD, 5 mg at 10/09/23 2113   DULoxetine (CYMBALTA) DR capsule 20 mg, 20 mg, Oral, Daily, Hall, Carole N, DO, 20 mg at 10/09/23 9604   feeding supplement (ENSURE ENLIVE / ENSURE PLUS) liquid 237 mL, 237 mL, Oral, BID BM, Haydee Lipa, MD, 237 mL at 10/09/23 0821   ferrous  sulfate tablet 325 mg, 325 mg,  Oral, Q breakfast, Reesa Cannon N, DO, 325 mg at 10/09/23 0821   haloperidol lactate (HALDOL) injection 2 mg, 2 mg, Intravenous, Q8H PRN, Ghimire, Kuber, MD, 2 mg at 10/09/23 1618   HYDROmorphone (DILAUDID) injection 0.25 mg, 0.25 mg, Intravenous, Q3H PRN, Ghimire, Kuber, MD, 0.25 mg at 10/09/23 1133   insulin aspart (novoLOG) injection 0-5 Units, 0-5 Units, Subcutaneous, QHS, Hall, Carole N, DO   insulin aspart (novoLOG) injection 0-9 Units, 0-9 Units, Subcutaneous, TID WC, Hall, Carole N, DO, 1 Units at 10/10/23 4098   melatonin tablet 5 mg, 5 mg, Oral, QHS PRN, Reesa Cannon N, DO, 5 mg at 10/09/23 2113   metoprolol succinate (TOPROL-XL) 24 hr tablet 50 mg, 50 mg, Oral, Daily, Ghimire, Kuber, MD   mirabegron ER (MYRBETRIQ) tablet 50 mg, 50 mg, Oral, Daily, Haydee Lipa, MD, 50 mg at 10/09/23 0821   pantoprazole (PROTONIX) EC tablet 40 mg, 40 mg, Oral, BID, Hall, Carole N, DO, 40 mg at 10/09/23 2113   polyethylene glycol (MIRALAX / GLYCOLAX) packet 17 g, 17 g, Oral, Daily PRN, Hall, Carole N, DO   pregabalin (LYRICA) capsule 50 mg, 50 mg, Oral, BID, Haydee Lipa, MD, 50 mg at 10/09/23 2113   QUEtiapine (SEROQUEL) tablet 12.5 mg, 12.5 mg, Oral, Lonza Robes, Kuber, MD, 12.5 mg at 10/09/23 1134   QUEtiapine (SEROQUEL) tablet 25 mg, 25 mg, Oral, QHS, Ghimire, Kuber, MD, 25 mg at 10/09/23 2113   rosuvastatin (CRESTOR) tablet 20 mg, 20 mg, Oral, QPM, Hall, Carole N, DO, 20 mg at 10/09/23 1745  Allergies: Allergies  Allergen Reactions   Asa [Aspirin] Other (See Comments)    GI bleeding   Gadolinium Derivatives Itching, Swelling and Other (See Comments)    Pt complained of face flushing/hottness and throat tightness/scratchiness immediately after the injections.  Within 4 minutes, all symptoms were gone and the study was completed.  No further complications or signs of allergy were exhibited after completion of study.    Iodinated Contrast Media Other (See Comments)    Pt does not  recall reaction   Pneumococcal 13-Val Conj Vacc     Other Reaction(s): achiness all over, dizziness, nausea, weakness   Scopolamine     Other Reaction(s): Delerium   Oxycodone-Acetaminophen Other (See Comments)    Dizziness and feeling of being uncomfortable     Buzz Cass, MD

## 2023-10-11 ENCOUNTER — Other Ambulatory Visit: Payer: Self-pay | Admitting: Neurological Surgery

## 2023-10-11 DIAGNOSIS — F05 Delirium due to known physiological condition: Secondary | ICD-10-CM | POA: Diagnosis not present

## 2023-10-11 DIAGNOSIS — M549 Dorsalgia, unspecified: Secondary | ICD-10-CM | POA: Diagnosis not present

## 2023-10-11 LAB — TYPE AND SCREEN
ABO/RH(D): O POS
Antibody Screen: NEGATIVE

## 2023-10-11 LAB — GLUCOSE, CAPILLARY
Glucose-Capillary: 149 mg/dL — ABNORMAL HIGH (ref 70–99)
Glucose-Capillary: 179 mg/dL — ABNORMAL HIGH (ref 70–99)
Glucose-Capillary: 184 mg/dL — ABNORMAL HIGH (ref 70–99)
Glucose-Capillary: 151 mg/dL — ABNORMAL HIGH (ref 70–99)

## 2023-10-11 NOTE — Progress Notes (Signed)
 Daily Progress Note   Patient Name: Christian Bailey       Date: 10/11/2023 DOB: [birth year]  Age: 83 y.o. MRN#: [id number] Attending Physician: Vada Garibaldi, MD Primary Care Physician: Lonzie Robins, MD Admit Date: 10/02/2023  Reason for Consultation/Follow-up: Establishing goals of care  Subjective: Medical records reviewed including progress notes, labs, and imaging. Patient assessed at the bedside. Discussed with RN.  He reports soreness bilaterally from the knees down.  Improved with SCDs been turned off.  He is not sure if neurosurgery has been by today yet.  No other complaints.  No family present during my visit.  Explored patient's thoughts and feelings about the possibility of surgery.  He states he feels "positively" about it.  Called patient's wife Christian Bailey for ongoing goals of care discussions.  She is trying to handle things around the home and very grateful that he has improved from last week.  She will be back later this afternoon and hopes to catch neurosurgery for discussion of surgical intervention at that time.  She states, "if it helps him then I am all for it."  Patient's wife also re-states that that she is very interested in CIR. She is appreciative of ongoing palliative support based on patient's clinical course.  Questions and concerns addressed. PMT will continue to support holistically.   Length of Stay: 8   Physical Exam Vitals and nursing note reviewed.  Constitutional:      General: He is not in acute distress.    Appearance: He is ill-appearing.     Interventions: Cervical collar in place.  Cardiovascular:     Rate and Rhythm: Normal rate.  Pulmonary:     Effort: Pulmonary effort is normal.  Neurological:     Mental Status: He is alert.            Vital Signs: BP 138/67 (BP Location: Left Arm)   Pulse  (!) 101   Temp 97.8 F (36.6 C) (Axillary)   Resp 18   Ht 5\' 5"  (1.651 m)   Wt 64.3 kg   SpO2 94%   BMI 23.59 kg/m  SpO2: SpO2: 94 % O2 Device: O2 Device: Room Air O2 Flow Rate: O2 Flow Rate (L/min): (P) 2 L/min      Palliative Assessment/Data:   Palliative Care Assessment & Plan   Patient Profile: 83 y.o. male  with past medical history of peripheral artery disease on Plavix and Crestor, right bundle branch block, chronic diastolic heart failure, CKD stage IV, ambulatory dysfunction, hypertension hyperlipidemia type 2 diabetes, chronic anxiety and depression and GERD admitted on 10/02/2023 with fall and head injury.    In the ED, MRI cervical spine without contrast showed edematous changes along the posterior spinous ligament from C2-C5 compatible with acute ligamentous injury.  Diffuse edematous changes within the posterior paraspinous musculature bilaterally throughout the cervical spine compatible with muscle strain.  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  Moderate foraminal stenosis bilaterally at C6-7 and on the right at C7-T1.  Mild foraminal narrowing bilaterally at C2-3 and C3-4.  He was also found to have AKI on CKD 4.  Family has been discussing option of surgical decompression with neurosurgeon. PMT has been consulted to assist with goals of care conversation.  Assessment: Goals of care conversation Acute metabolic encephalopathy, improving Cervical myelopathy with spasticity Fall  Recommendations/Plan: Continue DNR/DNI Patient and family leaning towards proceeding with surgery, await further discussion with neurosurgery Continue current care plan Psychosocial and emotional support provided Ongoing goals of care conversations PMT will continue to follow and support   Prognosis:  Unable to determine  Discharge Planning: To Be Determined  Care plan was discussed with  patient, patient's wife, RN   Total time: I spent 35 minutes in the care of the patient today in the above activities and documenting the encounter.   Steven Veazie P Mariany Mackintosh, PA-C  Palliative Medicine Team Team phone # [phone]  Thank you for allowing the Palliative Medicine Team to assist in the care of this patient. Please utilize secure chat with additional questions, if there is no response within 30 minutes please call the above phone number.  Palliative Medicine Team providers are available by phone from 7am to 7pm daily and can be reached through the team cell phone.  Should this patient require assistance outside of these hours, please call the patient's attending physician.

## 2023-10-11 NOTE — H&P (View-Only) (Signed)
 Patient ID: Christian Bailey, male   DOB: 1941/02/27, 83 y.o.   MRN: [id number] Patient is lucid  today. Complains of legs being weak and stiff. Discussed surgical considerations with him and he is agreeable to  proceeding. I then contacted his wife and sone and they are eager for the surgery. Will schedule for tomorrow am.

## 2023-10-11 NOTE — Plan of Care (Signed)
  Problem: Tissue Perfusion: Goal: Adequacy of tissue perfusion will improve Outcome: Not Progressing   Problem: Skin Integrity: Goal: Risk for impaired skin integrity will decrease Outcome: Not Progressing   Problem: Metabolic: Goal: Ability to maintain appropriate glucose levels will improve Outcome: Not Progressing   Problem: Nutrition: Goal: Adequate nutrition will be maintained Outcome: Not Progressing   Problem: Coping: Goal: Level of anxiety will decrease Outcome: Not Progressing   Problem: Safety: Goal: Ability to remain free from injury will improve Outcome: Not Progressing

## 2023-10-11 NOTE — Progress Notes (Signed)
 Patient ID: Christian Bailey, male   DOB: 1941/02/27, 83 y.o.   MRN: [id number] Patient is lucid  today. Complains of legs being weak and stiff. Discussed surgical considerations with him and he is agreeable to  proceeding. I then contacted his wife and sone and they are eager for the surgery. Will schedule for tomorrow am.

## 2023-10-11 NOTE — Evaluation (Signed)
 Clinical/Bedside Swallow Evaluation Patient Details  Name: Christian Bailey MRN: [id number] Date of Birth: Feb 27, 1941  Today's Date: 10/11/2023 Time: SLP Start Time (ACUTE ONLY): 1242 SLP Stop Time (ACUTE ONLY): 1305 SLP Time Calculation (min) (ACUTE ONLY): 23 min  Past Medical History:  Past Medical History:  Diagnosis Date   Anemia    Complication of anesthesia    Diabetes mellitus (HCC)    TYPE 2   GERD (gastroesophageal reflux disease)    History of blood transfusion    GI bleed   History of colon polyps    History of hiatal hernia    Hyperlipemia    Hypertension    Osteoarthritis    PAD (peripheral artery disease) (HCC)    a. stenting of his left common iliac artery >20 years ago. b. h/o LEIA stent and 2 stents to R SFA in 2011. c. 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 Noted during admission 04/2014 but upon review seems to be frequent finding for patient.   Stenosis of carotid artery    a. 50% right carotid stenosis by angiogram 04/2014.   Past Surgical History:  Past Surgical History:  Procedure Laterality Date   ABDOMINAL AORTOGRAM W/LOWER EXTREMITY N/A 01/27/2017   Procedure: Abdominal Aortogram w/Lower Extremity;  Surgeon: Margherita Shell, MD;  Location: [facility];  Service: Cardiovascular;  Laterality: N/A;   ABDOMINAL AORTOGRAM W/LOWER EXTREMITY N/A 06/08/2017   Procedure: ABDOMINAL AORTOGRAM W/LOWER EXTREMITY;  Surgeon: Margherita Shell, MD;  Location: [facility];  Service: Cardiovascular;  Laterality: N/A;   ABDOMINAL AORTOGRAM W/LOWER EXTREMITY N/A 08/31/2017   Procedure: ABDOMINAL AORTOGRAM W/LOWER  EXTREMITY;  Surgeon: Margherita Shell, MD;  Location: [facility];  Service: Cardiovascular;  Laterality: N/A;  rt. unilateral   ANGIOPLASTY / STENTING FEMORAL     ANGIOPLASTY / STENTING ILIAC     AORTA - BILATERAL FEMORAL ARTERY BYPASS GRAFT N/A 07/06/2018   Procedure: AORTA BIFEMORAL BYPASS GRAFT USING 14X7MM X 40CM HEMASHIELD GOLD GRAFT;  Surgeon: Margherita Shell, MD;  Location: [facility] OR;  Service: Vascular;  Laterality: N/A;   CEREBRAL ANGIOGRAM N/A 05/14/2014   Procedure: CEREBRAL ANGIOGRAM;  Surgeon: Avanell Leigh, MD;  Location: [facility] CATH LAB;  Service: Cardiovascular;  Laterality: N/A;   COLONOSCOPY W/ POLYPECTOMY     ENDARTERECTOMY Right 09/08/2017   ENDARTERECTOMY FEMORAL Right 09/08/2017   Procedure: REDO RIGHT FEMORAL ENDARTECTOMY WITH PATCH ANGIOPLASTY.;  Surgeon: Margherita Shell, MD;  Location: [facility] OR;  Service: Vascular;  Laterality: Right;   EYE SURGERY Bilateral    cataract   FEMORAL ARTERY STENT Right 05/12/2010   Stented distally with a 6x100 Abbott absolute stent and proximally with a 6x60 Cook Zilver stent resulting in the reduction of the proximal segment 80% and mid segment 60-70% to 0% residual, LEFT common femoral artery stented with a 7x3 Smart stent resulting in reduction of 90% stenosis to 0% residual   FEMORAL-POPLITEAL BYPASS GRAFT Right 09/18/2016   Procedure: BYPASS GRAFT FEMORAL-POPLITEAL ARTERY;  Surgeon: Margherita Shell, MD;  Location[facility];  Service: Vascular;  Laterality: Right;   FEMORAL-POPLITEAL BYPASS GRAFT  Right 09/08/2017   Procedure: REDO BYPASS GRAFT FEMORAL-POPLITEAL ARTERY;  Surgeon: Margherita Shell, MD;  Location: [facility] OR;  Service: Vascular;  Laterality: Right;   FEMORAL-POPLITEAL BYPASS GRAFT Right 07/06/2018   Procedure: REVISION RIGHT FEMORAL TO POPLITEAL ARTERY BYPASS GRAFT;  Surgeon: Margherita Shell, MD;  Location: [facility] OR;  Service: Vascular;  Laterality: Right;   IR CHOLANGIOGRAM EXISTING TUBE  08/04/2018   IR EXCHANGE BILIARY DRAIN  01/22/2019    IR PERC CHOLECYSTOSTOMY  07/19/2018   IR RADIOLOGIST EVAL & MGMT  08/31/2018   IR RADIOLOGIST EVAL & MGMT  01/17/2019   IR RADIOLOGIST EVAL & MGMT  02/01/2019   IR RADIOLOGIST EVAL & MGMT  02/08/2019   KNEE ARTHROSCOPY     left   LOWER EXTREMITY ANGIOGRAM N/A 05/14/2014   Procedure: LOWER EXTREMITY ANGIOGRAM;  Surgeon: Avanell Leigh, MD;  Location: [facility] CATH LAB;  Service: Cardiovascular;  Laterality: N/A;   LOWER EXTREMITY ANGIOGRAPHY N/A 09/21/2016   Procedure: Lower Extremity Angiography;  Surgeon: Arvil Lauber, MD;  Location: [facility] INVASIVE CV LAB;  Service: Cardiovascular;  Laterality: N/A;   PERIPHERAL VASCULAR ATHERECTOMY Right 01/27/2017   Procedure: PERIPHERAL VASCULAR ATHERECTOMY;  Surgeon: Margherita Shell, MD;  Location: [facility];  Service: Cardiovascular;  Laterality: Right;   PERIPHERAL VASCULAR BALLOON ANGIOPLASTY Right 06/08/2017   Procedure: PERIPHERAL VASCULAR BALLOON ANGIOPLASTY;  Surgeon: Margherita Shell, MD;  Location: [facility];  Service: Cardiovascular;  Laterality: Right;  common femoral and superficial femoral arteries   PERIPHERAL VASCULAR CATHETERIZATION N/A 04/20/2016   Procedure: Lower Extremity Intervention;  Surgeon: Avanell Leigh, MD;  Location: [facility] INVASIVE CV LAB;  Service: Cardiovascular;  Laterality: N/A;   PERIPHERAL VASCULAR INTERVENTION  08/31/2017   Procedure: PERIPHERAL VASCULAR INTERVENTION;  Surgeon: Margherita Shell, MD;  Location: [facility];  Service: Cardiovascular;;  REIA   REVERSE SHOULDER ARTHROPLASTY Right 12/07/2016   REVERSE SHOULDER ARTHROPLASTY Right 12/07/2016   Procedure: REVERSE SHOULDER ARTHROPLASTY;  Surgeon: Winston Hawking, MD;  Location: [facility] OR;  Service: Orthopedics;  Laterality: Right;   ROTATOR CUFF REPAIR Right 2003   SFA Right 05/14/2014   PTA  OF RT SFA         DR BERRY   HPI:  Patient is an 83 yo male presenting to the ED status post fall on 10/02/23. Patient hit head in fall, with CT of head and spine  clear. MRI finding edematous changes from C2-C5. 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  Moderate foraminal stenosis bilaterally at C6-7 and on the right at C7-T1.  Mild foraminal narrowing bilaterally at C2-3 and C3-4. PMH includes:  peripheral artery disease on Plavix and Crestor, RBBB, chronic HFpEF, CKD 4, carotid stenosis, hypertension, hyperlipidemia, type 2 diabetes, chronic anxiety/depression, iron deficiency anemia, GERD    Assessment / Plan / Recommendation  Clinical Impression  Patient presents with clinical swallowing function that appears inherently safe but mildly inefficient across skilled observation of regular/thin liquid meal tray tolerance. Oral mechanism exam was unremarkable. RN present upon SLP entry who reports tolerance of medications administered whole with thin liquids without difficulty. Patient endorses no difficulty with meals nor baseline trouble swallowing. No overt s/sx of penetration/aspiration observed with thin liquids. During solid trials, exhibited prolonged oral preparation and lingual coating of residue which eventually cleared with subsequent swallows and/or liquid wash spontaneously. Recommend continuation of regular/thin liquid diet, although  trials across session were limited. Of note, patient required full assist for feeding. Patient may benefit from further assessment to assess overall diet tolerance and then SLP will likely sign off. SLP Visit Diagnosis: Dysphagia, unspecified (R13.10)    Aspiration Risk  Mild aspiration risk    Diet Recommendation Regular;Thin liquid    Liquid Administration via: Cup;Straw Medication Administration: Whole meds with liquid Supervision: Full supervision/cueing for compensatory strategies Compensations: Slow rate;Small sips/bites;Minimize environmental distractions;Follow solids with liquid Postural Changes: Seated upright at 90  degrees    Other  Recommendations Oral Care Recommendations: Oral care BID    Recommendations for follow up therapy are one component of a multi-disciplinary discharge planning process, led by the attending physician.  Recommendations may be updated based on patient status, additional functional criteria and insurance authorization.  Follow up Recommendations Acute inpatient rehab (3hours/day)      Assistance Recommended at Discharge    Functional Status Assessment Patient has had a recent decline in their functional status and demonstrates the ability to make significant improvements in function in a reasonable and predictable amount of time.  Frequency and Duration min 1 x/week  1 week       Prognosis Prognosis for improved oropharyngeal function: Good      Swallow Study   General Date of Onset: 10/02/23 HPI: Patient is an 83 yo male presenting to the ED status post fall on 10/02/23. Patient hit head in fall, with CT of head and spine clear. MRI finding edematous changes from C2-C5. 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  Moderate foraminal stenosis bilaterally at C6-7 and on the right at C7-T1.  Mild foraminal narrowing bilaterally at C2-3 and C3-4. PMH includes:  peripheral artery disease on Plavix and Crestor, RBBB, chronic HFpEF, CKD 4, carotid stenosis, hypertension, hyperlipidemia, type 2 diabetes, chronic anxiety/depression, iron deficiency anemia, GERD Type of Study: Bedside Swallow Evaluation Previous Swallow Assessment: n/a Diet Prior to this Study: Regular;Thin liquids (Level 0) Temperature Spikes Noted: No Respiratory Status: Room air History of Recent Intubation: No Behavior/Cognition: Alert;Cooperative;Pleasant mood Oral Cavity Assessment: Within Functional Limits Oral Care Completed by SLP: No Oral Cavity - Dentition: Adequate natural dentition Vision: Impaired for  self-feeding Self-Feeding Abilities: Needs assist;Needs set up Patient Positioning: Upright in bed Baseline Vocal Quality: Normal Volitional Cough: Weak Volitional Swallow: Able to elicit    Oral/Motor/Sensory Function Overall Oral Motor/Sensory Function: Within functional limits   Ice Chips Ice chips: Not tested   Thin Liquid Thin Liquid: Within functional limits Presentation: Self Fed;Straw    Nectar Thick Nectar Thick Liquid: Not tested   Honey Thick Honey Thick Liquid: Not tested   Puree Puree: Not tested   Solid     Solid: Impaired Presentation: Self Fed Oral Phase Impairments: Impaired mastication Oral Phase Functional Implications: Oral residue;Prolonged oral transit     Dorla Gartner, M.A., CCC-SLP  Odilia Bennett A Bana Borgmeyer 10/11/2023,2:13 PM

## 2023-10-11 NOTE — Progress Notes (Signed)
 PROGRESS NOTE    Christian Bailey  WUJ:[id number] DOB: 1941/01/06 DOA: 10/02/2023 PCP: Chilton Greathouse, MD    Brief Narrative:  83 year old with history of peripheral artery disease on Plavix and Crestor, right bundle branch block, chronic diastolic heart failure, CKD stage IV, ambulatory dysfunction, hypertension,  hyperlipidemia,  type 2 diabetes, chronic anxiety and depression and GERD presented to the emergency room with a fall.  Patient reportedly with worsening mobility since last 6 months.  In the emergency room hemodynamically stable.  Imaging consistent with posterior spinous ligament injury from C2-C5.  Difficult to tolerate pain medications and getting delirious with narcotics.  Followed by neurosurgery.  Also with leukocytosis.  Subjective:  Patient was seen and examined.  Wife arrived at the bedside.  Today patient is absolutely asymptomatic.  Patient tells me that he slept well except few disturbances.  He is alert awake oriented and interactive.  He tells me that his wife told him he was in [place] for the last few days.  He has some pain and spasm from the neck otherwise no other complaints.  Difficulty using legs.  Assessment & Plan:   Acute metabolic encephalopathy likely secondary to polypharmacy and narcotics: Hyperactive delirium secondary to multiple factors including hospital-acquired delirium.  History of delirium in 2022. Cervical myelopathy with spasticity.  Mental status waxes and wanes.  Had developed severe delirium and hallucination.  Responded to haloperidol and Seroquel at night. 4/14, mental status has improved. Avoiding all sedatives.  Tylenol 1 g every 6 hours around-the-clock.  Baclofen tolerated well. Continue to work with PT OT.  Soft collar. Neurosurgery following, operative versus conservative management. Haloperidol as needed.  Avoiding long-acting antipsychotics.  Acute intractable pain, peripheral arterial disease: Continue Cymbalta, Tylenol 1 g  every 6 hours, Lyrica 50 mg twice daily, Flexeril 5 mg 3 times daily.  Bowel regimen.    CKD stage IV: At about baseline.  Anxiety depression: On Cymbalta.  Leukocytosis: Patient presented with WBC count of 19,000.  Urine was clear.  No other clear source of infection.  Treated with Rocephin and azithromycin. WBC already trending down.   Procalcitonin 5.5.  WC count trending down. Cultures negative so far.  Normalized.  Completed antibiotics.  Essential hypertension: Blood pressure stable.  Holding lisinopril.  Resumed metoprolol.  Type 2 diabetes: Known A1c 8.  On sliding scale.  Acute urinary retention: With altered mentation, he had to have multiple In-N-Out cath.  Currently has a Foley catheter.  Will remove once he is mobile.  Goal of care: Patient has gone through severe episodes of delirium and today improving. Neurosurgery to follow-up today with family to offer surgical versus nonsurgical care. Look forward to discharge to acute inpatient rehab.    DVT prophylaxis: SCDs Start: 10/02/23 2244   Code Status: DNR with limited intervention. Family Communication: Wife at the bedside. Disposition Plan: Status is: Inpatient PatientRemains inpatient appropriate because: Improving delirium.  Needs rehab.     Consultants:  Neurosurgery Palliative care Psychiatry.  Procedures:  None  Antimicrobials:  Rocephin azithromycin 4/8--- 4/11     Objective: Vitals:   10/10/23 1604 10/10/23 2031 10/11/23 0545 10/11/23 0643  BP: (!) 142/63 (!) 114/57  138/67  Pulse: (!) 118 98  (!) 101  Resp: 16 17  18   Temp: 98.1 F (36.7 C) 98.7 F (37.1 C)  97.8 F (36.6 C)  TempSrc:  Oral  Axillary  SpO2: 94% 97%  94%  Weight:   64.3 kg   Height:  Intake/Output Summary (Last 24 hours) at 10/11/2023 1339 Last data filed at 10/11/2023 1100 Gross per 24 hour  Intake 620 ml  Output 1050 ml  Net -430 ml   Filed Weights   10/09/23 0720 10/10/23 0414 10/11/23 0545  Weight:  66.4 kg 65.1 kg 64.3 kg    Examination: In morning rounds  General exam: Fairly comfortable.  Interactive. Respiratory system: Clear to auscultation. Respiratory effort normal. Cardiovascular system: S1 & S2 heard, RRR.  Gastrointestinal system: Soft.  Nontender.  Bowel sound present.  Foley catheter with clear urine. Central nervous system: Alert awake and oriented.  Moving all extremities equally.  Generalized weakness mostly on the legs.   Data Reviewed: I have personally reviewed following labs and imaging studies  CBC: Recent Labs  Lab 10/05/23 1430 10/06/23 0521 10/07/23 0449 10/08/23 0400 10/09/23 0439  WBC 19.1* 14.2* 13.6* 13.2* 10.4  HGB 11.8* 10.0* 9.8* 10.5* 9.5*  HCT 34.6* 29.8* 29.5* 32.0* 28.2*  MCV 85.6 87.1 88.3 88.9 86.5  PLT 303 271 282 319 322   Basic Metabolic Panel: Recent Labs  Lab 10/05/23 1430 10/06/23 0521 10/07/23 0449 10/08/23 0400 10/09/23 0439  NA 132* 137 136 138 137  K 4.7 4.3 3.9 4.3 3.6  CL 99 103 103 107 104  CO2 21* 23 21* 17* 20*  GLUCOSE 168* 124* 123* 147* 124*  BUN 39* 46* 59* 51* 55*  CREATININE 2.28* 2.43* 2.85* 2.36* 2.26*  CALCIUM 9.0 8.5* 8.4* 8.9 8.8*   GFR: Estimated Creatinine Clearance: 21.9 mL/min (A) (by C-G formula based on SCr of 2.26 mg/dL (H)). Liver Function Tests: Recent Labs  Lab 10/05/23 1430 10/06/23 0521 10/07/23 0449 10/08/23 0400 10/09/23 0439  AST 25 16 19 29  34  ALT 17 12 12 20 23   ALKPHOS 66 54 47 69 62  BILITOT 1.0 0.7 0.5 1.0 0.7  PROT 7.5 6.3* 5.8* 6.4* 6.1*  ALBUMIN 3.5 2.8* 2.4* 2.6* 2.5*   No results for input(s): "LIPASE", "AMYLASE" in the last 168 hours. No results for input(s): "AMMONIA" in the last 168 hours. Coagulation Profile: No results for input(s): "INR", "PROTIME" in the last 168 hours. Cardiac Enzymes: No results for input(s): "CKTOTAL", "CKMB", "CKMBINDEX", "TROPONINI" in the last 168 hours. BNP (last 3 results) No results for input(s): "PROBNP" in the last 8760  hours. HbA1C: No results for input(s): "HGBA1C" in the last 72 hours. CBG: Recent Labs  Lab 10/10/23 1215 10/10/23 1740 10/10/23 2109 10/11/23 0658 10/11/23 1151  GLUCAP 158* 140* 131* 149* 179*   Lipid Profile: No results for input(s): "CHOL", "HDL", "LDLCALC", "TRIG", "CHOLHDL", "LDLDIRECT" in the last 72 hours. Thyroid Function Tests: No results for input(s): "TSH", "T4TOTAL", "FREET4", "T3FREE", "THYROIDAB" in the last 72 hours. Anemia Panel: No results for input(s): "VITAMINB12", "FOLATE", "FERRITIN", "TIBC", "IRON", "RETICCTPCT" in the last 72 hours. Sepsis Labs: Recent Labs  Lab 10/07/23 0449  PROCALCITON 5.56    Recent Results (from the past 240 hours)  Culture, blood (Routine X 2) w Reflex to ID Panel     Status: None   Collection Time: 10/05/23  2:30 PM   Specimen: BLOOD  Result Value Ref Range Status   Specimen Description BLOOD BLOOD LEFT HAND  Final   Special Requests   Final    BOTTLES DRAWN AEROBIC AND ANAEROBIC Blood Culture adequate volume   Culture   Final    NO GROWTH 5 DAYS Performed at [facility], [address] [place], [place] [address]    Report Status  10/10/2023 FINAL  Final  Culture, blood (Routine X 2) w Reflex to ID Panel     Status: None   Collection Time: 10/05/23  2:30 PM   Specimen: BLOOD LEFT HAND  Result Value Ref Range Status   Specimen Description BLOOD LEFT HAND  Final   Special Requests   Final    BOTTLES DRAWN AEROBIC AND ANAEROBIC Blood Culture adequate volume   Culture   Final    NO GROWTH 5 DAYS Performed at [facility], [address], [place], [place] [address]    Report Status 10/10/2023 FINAL  Final         Radiology Studies: No results found.       Scheduled Meds:  acetaminophen  1,000 mg Oral Q6H   Chlorhexidine Gluconate Cloth  6 each Topical Daily   DULoxetine  20 mg Oral Daily   feeding supplement  237 mL Oral BID BM   ferrous sulfate  325 mg Oral Q breakfast   insulin aspart  0-5  Units Subcutaneous QHS   insulin aspart  0-9 Units Subcutaneous TID WC   metoprolol succinate  50 mg Oral Daily   mirabegron ER  50 mg Oral Daily   pantoprazole  40 mg Oral BID   pregabalin  50 mg Oral BID   rosuvastatin  20 mg Oral QPM   Continuous Infusions:     LOS: 8 days    Time spent: 40 minutes    Vada Garibaldi, MD [facility]

## 2023-10-11 NOTE — Consult Note (Signed)
 [facility] Psychiatric Consult Initial  Patient Name: .Christian Bailey  MRN: [id number]  DOB: April 11, 1941  Consult Order details:  Orders (From admission, onward)     Start     Ordered   10/09/23 1110  IP CONSULT TO PSYCHIATRY       Ordering Provider: Vada Garibaldi, MD  Provider:  (Not yet assigned)  Question Answer Comment  Location [facility]   Reason for Consult? medication management , severe delirium      10/09/23 1109             Mode of Visit: In person    Psychiatry Consult Evaluation  Service Date: October 11, 2023 LOS:  LOS: 8 days  Chief Complaint the patient has no specific complaints.  The consult was requested due to confusion, delirium and auditory and visual hallucinations.  Primary Psychiatric Diagnoses  Acute delirium likely by medications/hospitalization. 2.  Auditory and tactile hallucinations. 3.  Rule out MCI.  Assessment  Christian Bailey is a 83 y.o. male admitted: Medicallyfor 10/02/2023  2:46 PM for delirium and agitation. He carries the psychiatric diagnoses of none and has a past medical history of please see H&P.   The patient was confused yesterday in addition to being hyperactive and mildly agitated.  He is lucid today and seemingly returning to baseline. It seems he has been more involved with his plan of care today. He presents with decreased focal deficits without the use of antipsychotics in the past 48 hours.    Please see below for recommendations.  Diagnoses:  Active Hospital problems: Principal Problem:   Fall Active Problems:   Intractable back pain   Delirium due to another medical condition, acute, hyperactive    Plan   ## Psychiatric Medication Recommendations:  Discontinue opioids and if possible avoid the use of opioids. He used Tramadol in the past. Given the hyperactive delirium consider use of low-dose Haldol between 0.5 to 2 mg given IV or IM.consider discontinuation of Seroquel. Monitor for EPS  symptoms and QT interval.  Unfortunately his QTcB interval is 570 on 10/09/2023.  Caution with use of antipsychotics. Continue to use caution with antipsychotics.  Consider using Ativan PRN for agitation as indicated.  ## Medical Decision Making Capacity: Not specifically addressed in this encounter  ## Further Work-up:  -- see below TSH, B12, folate, EKG, or While pt on Qtc prolonging medications, please monitor & replete K+ to 4 and Mg2+ to 2 -- most recent EKG on 10/04/2023 had QtC of 506 -- Pertinent labwork reviewed earlier this admission includes: CBC and differential, urine,EKG.   ## Disposition:-- Plan Post Discharge/Psychiatric Care Follow-up resources if indicated.  ## Behavioral / Environmental: -Delirium Precautions: Delirium Interventions for Nursing and Staff:  - RN to open blinds every AM.  - To Bedside: Glasses, hearing aide, and pt's own shoes. Make available to patients. when possible and encourage use.  - Encourage po fluids when appropriate, keep fluids within reach.  - OOB to chair with meals.  - Passive ROM exercises to all extremities with AM & PM care.  - RN to assess orientation to person, time and place QAM and PRN.  - Recommend extended visitation hours with familiar family/friends as feasible.  - Staff to minimize disturbances at night. Turn off television when pt asleep or when not in use.    ## Safety and Observation Level:  - Based on my clinical evaluation, I estimate the patient to be at low risk of self harm in the  current setting. - At this time, we recommend  routine. This decision is based on my review of the chart including patient's history and current presentation, interview of the patient, mental status examination, and consideration of suicide risk including evaluating suicidal ideation, plan, intent, suicidal or self-harm behaviors, risk factors, and protective factors. This judgment is based on our ability to directly address suicide risk, implement  suicide prevention strategies, and develop a safety plan while the patient is in the clinical setting. Please contact our team if there is a concern that risk level has changed.  CSSR Risk Category:C-SSRS RISK CATEGORY: No Risk  Suicide Risk Assessment: Patient has following modifiable risk factors for suicide: NONE, which we are addressing by None. Patient has following non-modifiable or demographic risk factors for suicide: male gender Patient has the following protective factors against suicide: Supportive family, Supportive friends, Cultural, spiritual, or religious beliefs that discourage suicide, and no history of suicide attempts  Thank you for this consult request. Recommendations have been communicated to the primary team.  We will sign off at this time.   [name], FNP       History of Present Illness  Relevant Aspects of [facility] Course:  Admitted on 10/02/2023 for history of a recent fall at home.Christian Bailey  Christian Bailey is a 83 y.o. male with medical history significant for peripheral artery disease on Plavix and Crestor, RBBB, chronic HFpEF, CKD 4, ambulatory dysfunction (uses a walker at baseline), carotid stenosis, hypertension, hyperlipidemia, type 2 diabetes, chronic anxiety/depression, iron deficiency anemia, GERD, who presents to the ER after a fall.  The patient states he has had trouble with his balance for many years which he attributes to his peripheral vascular disease. .   The patient was admitted for workup and treatment including a possible spinous ligamental injury to C2-T5 as well as diffuse muscular strain in the area.  He was receiving  PT and OT According to the wife the patient's ambulation status has been deteriorating significantly over the last 6 months which resulted in this fall.  He was seen by neurosurgery and a spinal decompression was recommended.  Apparently the patient was clear and lucid in his communication until 10/08/2023.  According to  the notes the patient started becoming confused and disorganized and started hallucinating.  Apparently had received 1 dose of Dilaudid 0.25 mg.  Since 10/08/2023 the patient has been hallucinating throughout the day.  10/09/2023: On examination today the patient was lying in bed with a cervical collar and appeared disheveled disorganized and disoriented.  When I introduced myself he reported that " I saw your father" then he started rambling about being in her house and that he was 83 years old and that today was his birthday.  He lives with his wife and stated that she was his Public relations account executive.  He was not oriented to time place or person.  He appeared to be actively hallucinating and continues to ramble and remains disorganized. Both staff and wife report that he does occasionally have lucid intervals.  However these are few and far in between. When the patient was examined he was lying in a darkened room with the sheets drawn and the lights off and he seemed more disorganized.    10/10/2023: The patient was seen today and the chart was reviewed and discussed progress with the charge nurse.  Patient apparently responded to the medication and did sleep last night and apparently slept for about 7 hours.  When seen today he  was still sedated and sleeping and no agitation or hyperactivity was noted.  However, staff reports persistent tachycardia in the 130s range.  And the EKG from 10/09/2023 shows a Qtc B at 570.  10/11/2023: The patient seen today and chart reviewed. WIfe is present at the bedside, much informaiton obtained from the wife as patient was asleeping. She reports that he has had (2) days and has been much more active today and making sense. He recently received pain medication and difficult to arouse but could answer general questions. Continues to deny si/hi/avh.    Psych ROS:  Depression: None noted Anxiety: None Mania (lifetime and current): None Psychosis: (lifetime and current): 1  prior episode of agitation confused on an opioid.  Collateral information:  Contacted wife who was present at bedside on 10/09/2023 She was able to provide additional information and reports that he has had no prior psychiatric history and never really drank to the point where he was having any problems with alcohol use.  He did completely stop using alcohol over 10 years ago.  He was in the [employer] for 28 years and after retirement has been living with his wife and had been declining gradually in his physical abilities including ambulation.  She reports that they have been married for over 58 years.  They have 2 children and there is no conflicts, prior history of psych problems, depression or anxiety.  However patient's wife reports that he had a similar episode when he was given a narcotic for 3 to 4 years ago.  And he became quite disorganized which resolved over.  A few days he is unclear what medication he received at that time.  Review of Systems  Psychiatric/Behavioral:  Positive for hallucinations. The patient is nervous/anxious and has insomnia.      Psychiatric and Social History  Psychiatric History:  Information collected from wife.  The patient's wife denies any prior psychiatric history.  Prev Dx/Sx: None Current Psych Provider: None Home Meds (current): None Previous Med Trials: None Therapy: None  Prior Psych Hospitalization: Denies Prior Self Harm: Denies Prior Violence: Denies  Family Psych History: Denies Family Hx suicide: Denies  Social History:  Developmental Hx: WNL Educational Hx: Academic librarian Hx: Retired from the [employer] Hx: None Living Situation: Lives with spouse Spiritual Hx: N/A Access to weapons/lethal means: Unknown  Substance History: Wife reported no prior history of alcohol or substance abuse. Alcohol: Denies Type of alcohol denies Last Drink denies.  Apparently last drink was about 10 years ago. Number of drinks per day not  applicable History of alcohol withdrawal seizures not applicable History of DT's not applicable Tobacco: Denies Illicit drugs: Denies Prescription drug abuse: Denies Rehab hx: Denies  Exam Findings  Physical Exam: Lying in bed asleep with audible snoring, soft neck collar in place.  Vital Signs:  Temp:  [97.7 F (36.5 C)-98.7 F (37.1 C)] 97.7 F (36.5 C) (04/14 1354) Pulse Rate:  [98-118] 101 (04/14 0643) Resp:  [16-18] 17 (04/14 1354) BP: (114-152)/(57-78) 152/78 (04/14 1354) SpO2:  [94 %-98 %] 98 % (04/14 1354) Weight:  [64.3 kg] 64.3 kg (04/14 0545) Blood pressure (!) 152/78, pulse (!) 101, temperature 97.7 F (36.5 C), resp. rate 17, height 5\' 5"  (1.651 m), weight 64.3 kg, SpO2 98%. Body mass index is 23.59 kg/m.  Physical Exam Please see the H&P by the hospitalist.  Mental Status Exam: General Appearance: Casual and Fairly Groomed  Orientation:  Full (Time, Place, and Person)  Memory:  Immediate;  Fair Recent;   Fair  Concentration:  Concentration: Improving  Recall:  Improving  Attention  Poor  Eye Contact:  Poor  Speech:  Normal Rate  Language:  Fair  Volume:  Normal  Mood: Better  Affect:  Congruent  Thought Process:  Coherent and Goal Directed  Thought Content:  Logical  Suicidal Thoughts:  No  Homicidal Thoughts:  No  Judgement:  Poor  Insight:  Lacking  Psychomotor Activity:  Normal  Akathisia:  No  Fund of Knowledge:  Fair      Assets:  Housing Intimacy Resilience Social Support Talents/Skills  Cognition:  WNL  ADL's:  Intact  AIMS (if indicated):        Other History   These have been pulled in through the EMR, reviewed, and updated if appropriate.  Family History:  The patient's family history includes Alcoholism in his brother; Diabetes type II in his brother; Esophageal cancer in his brother; Heart disease in his mother; Leukemia in his brother, brother, and mother; Liver disease in his brother; Lung disease in his sister; Stomach  cancer in his father.  Medical History: Past Medical History:  Diagnosis Date   Anemia    Complication of anesthesia    Diabetes mellitus (HCC)    TYPE 2   GERD (gastroesophageal reflux disease)    History of blood transfusion    GI bleed   History of colon polyps    History of hiatal hernia    Hyperlipemia    Hypertension    Osteoarthritis    PAD (peripheral artery disease) (HCC)    a. stenting of his left common iliac artery >20 years ago. b. h/o LEIA stent and 2 stents to R SFA in 2011. c. 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 Noted during admission 04/2014 but upon review seems to be frequent finding for patient.   Stenosis of carotid artery    a. 50% right carotid stenosis by angiogram 04/2014.    Surgical History: Past Surgical History:  Procedure Laterality Date   ABDOMINAL AORTOGRAM W/LOWER EXTREMITY N/A 01/27/2017   Procedure: Abdominal Aortogram w/Lower Extremity;  Surgeon: Nada Libman, MD;  Location: [facility] INVASIVE CV LAB;  Service: Cardiovascular;  Laterality: N/A;   ABDOMINAL AORTOGRAM W/LOWER EXTREMITY N/A 06/08/2017   Procedure: ABDOMINAL AORTOGRAM W/LOWER EXTREMITY;  Surgeon: Nada Libman, MD;  Location: [facility] INVASIVE CV LAB;  Service: Cardiovascular;  Laterality: N/A;   ABDOMINAL AORTOGRAM W/LOWER EXTREMITY N/A 08/31/2017   Procedure: ABDOMINAL AORTOGRAM W/LOWER EXTREMITY;  Surgeon: Nada Libman, MD;  Location: [facility] INVASIVE CV LAB;  Service: Cardiovascular;  Laterality: N/A;  rt. unilateral   ANGIOPLASTY / STENTING FEMORAL     ANGIOPLASTY / STENTING ILIAC     AORTA - BILATERAL FEMORAL ARTERY BYPASS GRAFT N/A  07/06/2018   Procedure: AORTA BIFEMORAL BYPASS GRAFT USING 14X7MM X 40CM HEMASHIELD GOLD GRAFT;  Surgeon: Nada Libman, MD;  Location: [facility] OR;  Service: Vascular;  Laterality: N/A;   CEREBRAL ANGIOGRAM N/A 05/14/2014   Procedure: CEREBRAL ANGIOGRAM;  Surgeon: Runell Gess, MD;  Location: [facility] CATH LAB;  Service: Cardiovascular;  Laterality: N/A;  COLONOSCOPY W/ POLYPECTOMY     ENDARTERECTOMY Right 09/08/2017   ENDARTERECTOMY FEMORAL Right 09/08/2017   Procedure: REDO RIGHT FEMORAL ENDARTECTOMY WITH PATCH ANGIOPLASTY.;  Surgeon: Margherita Shell, MD;  Location: [facility] OR;  Service: Vascular;  Laterality: Right;   EYE SURGERY Bilateral    cataract   FEMORAL ARTERY STENT Right 05/12/2010   Stented distally with a 6x100 Abbott absolute stent and proximally with a 6x60 Cook Zilver stent resulting in the reduction of the proximal segment 80% and mid segment 60-70% to 0% residual, LEFT common femoral artery stented with a 7x3 Smart stent resulting in reduction of 90% stenosis to 0% residual   FEMORAL-POPLITEAL BYPASS GRAFT Right 09/18/2016   Procedure: BYPASS GRAFT FEMORAL-POPLITEAL ARTERY;  Surgeon: Margherita Shell, MD;  Location: [facility] OR;  Service: Vascular;  Laterality: Right;   FEMORAL-POPLITEAL BYPASS GRAFT Right 09/08/2017   Procedure: REDO BYPASS GRAFT FEMORAL-POPLITEAL ARTERY;  Surgeon: Margherita Shell, MD;  Location: [facility] OR;  Service: Vascular;  Laterality: Right;   FEMORAL-POPLITEAL BYPASS GRAFT Right 07/06/2018   Procedure: REVISION RIGHT FEMORAL TO POPLITEAL ARTERY BYPASS GRAFT;  Surgeon: Margherita Shell, MD;  Location: [facility] OR;  Service: Vascular;  Laterality: Right;   IR CHOLANGIOGRAM EXISTING TUBE  08/04/2018   IR EXCHANGE BILIARY DRAIN  01/22/2019   IR PERC CHOLECYSTOSTOMY  07/19/2018   IR RADIOLOGIST EVAL & MGMT  08/31/2018   IR RADIOLOGIST EVAL & MGMT  01/17/2019   IR RADIOLOGIST EVAL & MGMT  02/01/2019   IR RADIOLOGIST EVAL & MGMT  02/08/2019   KNEE ARTHROSCOPY     left   LOWER EXTREMITY  ANGIOGRAM N/A 05/14/2014   Procedure: LOWER EXTREMITY ANGIOGRAM;  Surgeon: Avanell Leigh, MD;  Location: [facility] CATH LAB;  Service: Cardiovascular;  Laterality: N/A;   LOWER EXTREMITY ANGIOGRAPHY N/A 09/21/2016   Procedure: Lower Extremity Angiography;  Surgeon: Arvil Lauber, MD;  Location: [facility] INVASIVE CV LAB;  Service: Cardiovascular;  Laterality: N/A;   PERIPHERAL VASCULAR ATHERECTOMY Right 01/27/2017   Procedure: PERIPHERAL VASCULAR ATHERECTOMY;  Surgeon: Margherita Shell, MD;  Location: [facility];  Service: Cardiovascular;  Laterality: Right;   PERIPHERAL VASCULAR BALLOON ANGIOPLASTY Right 06/08/2017   Procedure: PERIPHERAL VASCULAR BALLOON ANGIOPLASTY;  Surgeon: Margherita Shell, MD;  Location: [facility];  Service: Cardiovascular;  Laterality: Right;  common femoral and superficial femoral arteries   PERIPHERAL VASCULAR CATHETERIZATION N/A 04/20/2016   Procedure: Lower Extremity Intervention;  Surgeon: Avanell Leigh, MD;  [facility] INVASIVE CV LAB;  Service: Cardiovascular;  Laterality: N/A;   PERIPHERAL VASCULAR INTERVENTION  08/31/2017   Procedure: PERIPHERAL VASCULAR INTERVENTION;  Surgeon: Margherita Shell, MD;  Location: [facility]VAS[facility] CV LAB;  Service: Cardiovascular;;  REIA   REVERSE SHOULDER ARTHROPLASTY Right 12/07/2016   REVERSE SHOULDER ARTHROPLASTY Right 12/07/2016   Procedure: REVERSE SHOULDER ARTHROPLASTY;  Surgeon: Winston Hawking, MD;  Location: [facility] OR;  Service: Orthopedics;  Laterality: Right;   ROTATOR CUFF REPAIR Right 2003   SFA Right 05/14/2014   PTA  OF RT SFA         DR BERRY     Medications:   Current Facility-Administered Medications:    acetaminophen (TYLENOL) tablet 1,000 mg, 1,000 mg, Oral, Q6H, Ghimire, Kuber, MD, 1,000 mg at 10/11/23 1240   Chlorhexidine Gluconate Cloth 2 % PADS 6 each, 6 each, Topical, Daily, Vada Garibaldi, MD, 6 each at 10/11/23 1104   cyclobenzaprine (FLEXERIL) tablet 5 mg, 5 mg, Oral, TID PRN, Haydee Lipa, MD,  5  mg at 10/09/23 2113   DULoxetine (CYMBALTA) DR capsule 20 mg, 20 mg, Oral, Daily, Reesa Cannon N, DO, 20 mg at 10/11/23 0846   feeding supplement (ENSURE ENLIVE / ENSURE PLUS) liquid 237 mL, 237 mL, Oral, BID BM, Haydee Lipa, MD, 237 mL at 10/11/23 1410   ferrous sulfate tablet 325 mg, 325 mg, Oral, Q breakfast, Reesa Cannon N, DO, 325 mg at 10/11/23 0846   haloperidol lactate (HALDOL) injection 2 mg, 2 mg, Intravenous, Q6H PRN, Ghimire, Kuber, MD   insulin aspart (novoLOG) injection 0-5 Units, 0-5 Units, Subcutaneous, QHS, Hall, Carole N, DO   insulin aspart (novoLOG) injection 0-9 Units, 0-9 Units, Subcutaneous, TID WC, Hall, Carole N, DO, 2 Units at 10/11/23 1242   melatonin tablet 5 mg, 5 mg, Oral, QHS PRN, Reesa Cannon N, DO, 5 mg at 10/09/23 2113   metoprolol succinate (TOPROL-XL) 24 hr tablet 50 mg, 50 mg, Oral, Daily, Ghimire, Kuber, MD, 50 mg at 10/11/23 0846   mirabegron ER (MYRBETRIQ) tablet 50 mg, 50 mg, Oral, Daily, Haydee Lipa, MD, 50 mg at 10/11/23 0846   pantoprazole (PROTONIX) EC tablet 40 mg, 40 mg, Oral, BID, Hall, Carole N, DO, 40 mg at 10/11/23 0846   polyethylene glycol (MIRALAX / GLYCOLAX) packet 17 g, 17 g, Oral, Daily PRN, Hall, Carole N, DO   pregabalin (LYRICA) capsule 50 mg, 50 mg, Oral, BID, Haydee Lipa, MD, 50 mg at 10/11/23 0846   rosuvastatin (CRESTOR) tablet 20 mg, 20 mg, Oral, QPM, Hall, Carole N, DO, 20 mg at 10/10/23 1721  Allergies: Allergies  Allergen Reactions   Asa [Aspirin] Other (See Comments)    GI bleeding   Gadolinium Derivatives Itching, Swelling and Other (See Comments)    Pt complained of face flushing/hottness and throat tightness/scratchiness immediately after the injections.  Within 4 minutes, all symptoms were gone and the study was completed.  No further complications or signs of allergy were exhibited after completion of study.    Iodinated Contrast Media Other (See Comments)    Pt does not recall reaction    Pneumococcal 13-Val Conj Vacc     Other Reaction(s): achiness all over, dizziness, nausea, weakness   Scopolamine     Other Reaction(s): Delerium   Oxycodone-Acetaminophen Other (See Comments)    Dizziness and feeling of being uncomfortable     [name], FNP

## 2023-10-12 ENCOUNTER — Other Ambulatory Visit: Payer: Self-pay

## 2023-10-12 ENCOUNTER — Inpatient Hospital Stay (HOSPITAL_COMMUNITY): Admitting: Registered Nurse

## 2023-10-12 ENCOUNTER — Encounter (HOSPITAL_COMMUNITY): Payer: Self-pay | Admitting: Internal Medicine

## 2023-10-12 ENCOUNTER — Inpatient Hospital Stay (HOSPITAL_COMMUNITY)

## 2023-10-12 ENCOUNTER — Encounter (HOSPITAL_COMMUNITY)
Admission: EM | Disposition: A | Discharge status: Rehabilitation Facility | Payer: Self-pay | Source: Home / Self Care | Attending: Internal Medicine

## 2023-10-12 DIAGNOSIS — I129 Hypertensive chronic kidney disease with stage 1 through stage 4 chronic kidney disease, or unspecified chronic kidney disease: Secondary | ICD-10-CM | POA: Diagnosis not present

## 2023-10-12 DIAGNOSIS — Z87891 Personal history of nicotine dependence: Secondary | ICD-10-CM

## 2023-10-12 DIAGNOSIS — N1832 Chronic kidney disease, stage 3b: Secondary | ICD-10-CM | POA: Diagnosis not present

## 2023-10-12 DIAGNOSIS — F05 Delirium due to known physiological condition: Secondary | ICD-10-CM

## 2023-10-12 DIAGNOSIS — G959 Disease of spinal cord, unspecified: Secondary | ICD-10-CM | POA: Diagnosis not present

## 2023-10-12 HISTORY — PX: ANTERIOR CERVICAL CORPECTOMY: SHX1159

## 2023-10-12 LAB — GLUCOSE, CAPILLARY
Glucose-Capillary: 151 mg/dL — ABNORMAL HIGH (ref 70–99)
Glucose-Capillary: 147 mg/dL — ABNORMAL HIGH (ref 70–99)
Glucose-Capillary: 148 mg/dL — ABNORMAL HIGH (ref 70–99)
Glucose-Capillary: 167 mg/dL — ABNORMAL HIGH (ref 70–99)
Glucose-Capillary: 263 mg/dL — ABNORMAL HIGH (ref 70–99)

## 2023-10-12 LAB — SURGICAL PCR SCREEN
MRSA, PCR: NEGATIVE
Staphylococcus aureus: NEGATIVE

## 2023-10-12 SURGERY — ANTERIOR CERVICAL CORPECTOMY
Anesthesia: General

## 2023-10-12 MED ORDER — FLEET ENEMA RE ENEM: 1.0000 | ENEMA | Freq: Once | RECTAL | Status: DC | PRN

## 2023-10-12 MED ORDER — CEFAZOLIN SODIUM 1 G IJ SOLR
INTRAMUSCULAR | Status: AC
  Filled 2023-10-12: qty 20

## 2023-10-12 MED ORDER — DEXAMETHASONE SODIUM PHOSPHATE 10 MG/ML IJ SOLN
INTRAMUSCULAR | Status: DC | PRN
  Administered 2023-10-12 (×2): 5 mg via INTRAVENOUS

## 2023-10-12 MED ORDER — ONDANSETRON HCL 4 MG/2ML IJ SOLN
INTRAMUSCULAR | Status: AC
  Filled 2023-10-12: qty 2

## 2023-10-12 MED ORDER — SODIUM CHLORIDE 0.9% FLUSH: 3.0000 mL | INTRAVENOUS | Status: DC | PRN

## 2023-10-12 MED ORDER — SENNA 8.6 MG PO TABS
1.0000 | ORAL_TABLET | Freq: Two times a day (BID) | ORAL | Status: DC
  Administered 2023-10-12 – 2023-10-18 (×12): 8.6 mg via ORAL
  Filled 2023-10-12 (×12): qty 1

## 2023-10-12 MED ORDER — FENTANYL CITRATE (PF) 250 MCG/5ML IJ SOLN
INTRAMUSCULAR | Status: AC
Start: 2023-10-12 — End: ?
  Filled 2023-10-12: qty 5

## 2023-10-12 MED ORDER — PHENYLEPHRINE 80 MCG/ML (10ML) SYRINGE FOR IV PUSH (FOR BLOOD PRESSURE SUPPORT)
PREFILLED_SYRINGE | INTRAVENOUS | Status: AC
  Filled 2023-10-12: qty 10

## 2023-10-12 MED ORDER — BISACODYL 10 MG RE SUPP: 10.0000 mg | Freq: Every day | RECTAL | Status: DC | PRN

## 2023-10-12 MED ORDER — PROPOFOL 10 MG/ML IV BOLUS
INTRAVENOUS | Status: DC | PRN
  Administered 2023-10-12: 100 ug/kg/min via INTRAVENOUS
  Administered 2023-10-12: 90 mg via INTRAVENOUS

## 2023-10-12 MED ORDER — LIDOCAINE-EPINEPHRINE 1 %-1:100000 IJ SOLN
INTRAMUSCULAR | Status: DC | PRN
  Administered 2023-10-12: 5 mL

## 2023-10-12 MED ORDER — DOCUSATE SODIUM 100 MG PO CAPS
100.0000 mg | ORAL_CAPSULE | Freq: Two times a day (BID) | ORAL | Status: DC
  Administered 2023-10-12 – 2023-10-18 (×12): 100 mg via ORAL
  Filled 2023-10-12 (×12): qty 1

## 2023-10-12 MED ORDER — LIDOCAINE-EPINEPHRINE 1 %-1:100000 IJ SOLN
INTRAMUSCULAR | Status: AC
  Filled 2023-10-12: qty 1

## 2023-10-12 MED ORDER — SODIUM CHLORIDE 0.9 % IV SOLN: INTRAVENOUS | Status: DC | PRN

## 2023-10-12 MED ORDER — ACETAMINOPHEN 325 MG PO TABS
650.0000 mg | ORAL_TABLET | ORAL | Status: DC | PRN
  Administered 2023-10-17: 650 mg via ORAL
  Filled 2023-10-12: qty 2

## 2023-10-12 MED ORDER — ALBUMIN HUMAN 5 % IV SOLN
INTRAVENOUS | Status: DC | PRN
Start: 2023-10-12 — End: 2023-10-12

## 2023-10-12 MED ORDER — PHENOL 1.4 % MT LIQD: 1.0000 | OROMUCOSAL | Status: DC | PRN

## 2023-10-12 MED ORDER — BUPIVACAINE HCL (PF) 0.5 % IJ SOLN
INTRAMUSCULAR | Status: AC
  Filled 2023-10-12: qty 30

## 2023-10-12 MED ORDER — PHENYLEPHRINE 80 MCG/ML (10ML) SYRINGE FOR IV PUSH (FOR BLOOD PRESSURE SUPPORT)
PREFILLED_SYRINGE | INTRAVENOUS | Status: DC | PRN
  Administered 2023-10-12: 160 ug via INTRAVENOUS

## 2023-10-12 MED ORDER — ROCURONIUM BROMIDE 10 MG/ML (PF) SYRINGE
PREFILLED_SYRINGE | INTRAVENOUS | Status: AC
  Filled 2023-10-12: qty 10

## 2023-10-12 MED ORDER — LABETALOL HCL 5 MG/ML IV SOLN
INTRAVENOUS | Status: AC
  Filled 2023-10-12: qty 4

## 2023-10-12 MED ORDER — ACETAMINOPHEN 650 MG RE SUPP: 650.0000 mg | RECTAL | Status: DC | PRN

## 2023-10-12 MED ORDER — CHLORHEXIDINE GLUCONATE 0.12 % MT SOLN
OROMUCOSAL | Status: AC
  Administered 2023-10-12: 15 mL via OROMUCOSAL
  Filled 2023-10-12: qty 15

## 2023-10-12 MED ORDER — ALUM & MAG HYDROXIDE-SIMETH 200-200-20 MG/5ML PO SUSP: 30.0000 mL | Freq: Four times a day (QID) | ORAL | Status: DC | PRN

## 2023-10-12 MED ORDER — ONDANSETRON HCL 4 MG PO TABS: 4.0000 mg | ORAL_TABLET | Freq: Four times a day (QID) | ORAL | Status: DC | PRN

## 2023-10-12 MED ORDER — SODIUM CHLORIDE 0.9 % IV SOLN: 250.0000 mL | INTRAVENOUS | Status: DC

## 2023-10-12 MED ORDER — LIDOCAINE 2% (20 MG/ML) 5 ML SYRINGE
INTRAMUSCULAR | Status: AC
  Filled 2023-10-12: qty 5

## 2023-10-12 MED ORDER — ONDANSETRON HCL 4 MG/2ML IJ SOLN
INTRAMUSCULAR | Status: DC | PRN
  Administered 2023-10-12: 4 mg via INTRAVENOUS

## 2023-10-12 MED ORDER — 0.9 % SODIUM CHLORIDE (POUR BTL) OPTIME
TOPICAL | Status: DC | PRN
  Administered 2023-10-12: 1000 mL

## 2023-10-12 MED ORDER — THROMBIN 5000 UNITS EX KIT
PACK | CUTANEOUS | Status: AC
  Filled 2023-10-12: qty 1

## 2023-10-12 MED ORDER — FENTANYL CITRATE (PF) 250 MCG/5ML IJ SOLN
INTRAMUSCULAR | Status: DC | PRN
Start: 2023-10-12 — End: 2023-10-12
  Administered 2023-10-12: 75 ug via INTRAVENOUS
  Administered 2023-10-12: 50 ug via INTRAVENOUS
  Administered 2023-10-12: 75 ug via INTRAVENOUS
  Administered 2023-10-12 (×2): 50 ug via INTRAVENOUS

## 2023-10-12 MED ORDER — BUPIVACAINE HCL (PF) 0.5 % IJ SOLN
INTRAMUSCULAR | Status: DC | PRN
  Administered 2023-10-12: 5 mL

## 2023-10-12 MED ORDER — DEXAMETHASONE SODIUM PHOSPHATE 10 MG/ML IJ SOLN
INTRAMUSCULAR | Status: AC
  Filled 2023-10-12: qty 1

## 2023-10-12 MED ORDER — SUGAMMADEX SODIUM 200 MG/2ML IV SOLN
INTRAVENOUS | Status: DC | PRN
Start: 2023-10-12 — End: 2023-10-12
  Administered 2023-10-12: 100 mg via INTRAVENOUS
  Administered 2023-10-12: 200 mg via INTRAVENOUS

## 2023-10-12 MED ORDER — ROCURONIUM BROMIDE 10 MG/ML (PF) SYRINGE
PREFILLED_SYRINGE | INTRAVENOUS | Status: DC | PRN
  Administered 2023-10-12: 60 mg via INTRAVENOUS
  Administered 2023-10-12 (×3): 10 mg via INTRAVENOUS

## 2023-10-12 MED ORDER — ORAL CARE MOUTH RINSE: 15.0000 mL | Freq: Once | OROMUCOSAL | Status: AC

## 2023-10-12 MED ORDER — FENTANYL CITRATE (PF) 250 MCG/5ML IJ SOLN
INTRAMUSCULAR | Status: AC
  Filled 2023-10-12: qty 5

## 2023-10-12 MED ORDER — DEXMEDETOMIDINE HCL IN NACL 80 MCG/20ML IV SOLN
INTRAVENOUS | Status: AC
  Filled 2023-10-12: qty 20

## 2023-10-12 MED ORDER — CEFAZOLIN SODIUM-DEXTROSE 2-4 GM/100ML-% IV SOLN
2.0000 g | Freq: Two times a day (BID) | INTRAVENOUS | Status: AC
  Administered 2023-10-12: 2 g via INTRAVENOUS
  Filled 2023-10-12: qty 100

## 2023-10-12 MED ORDER — LACTATED RINGERS IV SOLN: INTRAVENOUS | Status: DC

## 2023-10-12 MED ORDER — PROPOFOL 10 MG/ML IV BOLUS
INTRAVENOUS | Status: AC
  Filled 2023-10-12: qty 20

## 2023-10-12 MED ORDER — DEXMEDETOMIDINE HCL IN NACL 80 MCG/20ML IV SOLN
INTRAVENOUS | Status: DC | PRN
  Administered 2023-10-12: 8 ug via INTRAVENOUS

## 2023-10-12 MED ORDER — SODIUM CHLORIDE 0.9% FLUSH
3.0000 mL | Freq: Two times a day (BID) | INTRAVENOUS | Status: DC
  Administered 2023-10-12 – 2023-10-18 (×13): 3 mL via INTRAVENOUS

## 2023-10-12 MED ORDER — ONDANSETRON HCL 4 MG/2ML IJ SOLN: 4.0000 mg | Freq: Four times a day (QID) | INTRAMUSCULAR | Status: DC | PRN

## 2023-10-12 MED ORDER — MENTHOL 3 MG MT LOZG: 1.0000 | LOZENGE | OROMUCOSAL | Status: DC | PRN

## 2023-10-12 MED ORDER — CEFAZOLIN SODIUM-DEXTROSE 2-3 GM-%(50ML) IV SOLR
INTRAVENOUS | Status: DC | PRN
  Administered 2023-10-12: 2 g via INTRAVENOUS

## 2023-10-12 MED ORDER — LIDOCAINE 2% (20 MG/ML) 5 ML SYRINGE
INTRAMUSCULAR | Status: DC | PRN
  Administered 2023-10-12: 40 mg via INTRAVENOUS

## 2023-10-12 MED ORDER — CHLORHEXIDINE GLUCONATE 0.12 % MT SOLN: 15.0000 mL | Freq: Once | OROMUCOSAL | Status: AC

## 2023-10-12 MED ORDER — PHENYLEPHRINE HCL-NACL 20-0.9 MG/250ML-% IV SOLN
INTRAVENOUS | Status: DC | PRN
  Administered 2023-10-12: 40 ug/min via INTRAVENOUS

## 2023-10-12 MED ORDER — THROMBIN 5000 UNITS EX SOLR
OROMUCOSAL | Status: DC | PRN
  Administered 2023-10-12: 5 mL via TOPICAL

## 2023-10-12 SURGICAL SUPPLY — 50 items
BAG COUNTER SPONGE SURGICOUNT (BAG) ×1 IMPLANT
BAND RUBBER #18 3X1/16 STRL (MISCELLANEOUS) ×2 IMPLANT
BIT DRILL ACP 15 (DRILL) IMPLANT
BIT DRILL NEURO 2X3.1 SFT TUCH (MISCELLANEOUS) ×1 IMPLANT
BNDG GAUZE DERMACEA FLUFF 4 (GAUZE/BANDAGES/DRESSINGS) IMPLANT
BUR BARREL STRAIGHT FLUTE 4.0 (BURR) ×1 IMPLANT
BUR MATCHSTICK NEURO 3.0 LAGG (BURR) IMPLANT
CANISTER SUCT 3000ML PPV (MISCELLANEOUS) ×1 IMPLANT
CAP END MONOLITH PARA 14 RND (Cap) IMPLANT
COLLAR CERV LO CONTOUR FIRM DE (SOFTGOODS) IMPLANT
CORE MONOLITH 12X15 (Neuro Prosthesis/Implant) IMPLANT
DERMABOND ADVANCED .7 DNX12 (GAUZE/BANDAGES/DRESSINGS) ×1 IMPLANT
DRAPE LAPAROTOMY 100X72 PEDS (DRAPES) ×1 IMPLANT
DRAPE MICROSCOPE SLANT 54X150 (MISCELLANEOUS) IMPLANT
DRILL ACP 15 (DRILL) ×1 IMPLANT
DRILL NEURO 2X3.1 SOFT TOUCH (MISCELLANEOUS) ×1 IMPLANT
DRSG OPSITE POSTOP 4X6 (GAUZE/BANDAGES/DRESSINGS) IMPLANT
DURAPREP 6ML APPLICATOR 50/CS (WOUND CARE) ×1 IMPLANT
ELECT COATED BLADE 2.86 ST (ELECTRODE) ×1 IMPLANT
ELECT REM PT RETURN 9FT ADLT (ELECTROSURGICAL) ×1 IMPLANT
ELECTRODE REM PT RTRN 9FT ADLT (ELECTROSURGICAL) ×1 IMPLANT
GAUZE 4X4 16PLY ~~LOC~~+RFID DBL (SPONGE) IMPLANT
GLOVE BIOGEL PI IND STRL 8.5 (GLOVE) ×1 IMPLANT
GLOVE ECLIPSE 8.5 STRL (GLOVE) ×1 IMPLANT
GOWN STRL REUS W/ TWL LRG LVL3 (GOWN DISPOSABLE) IMPLANT
GOWN STRL REUS W/ TWL XL LVL3 (GOWN DISPOSABLE) ×1 IMPLANT
GOWN STRL REUS W/TWL 2XL LVL3 (GOWN DISPOSABLE) ×1 IMPLANT
HALTER HD/CHIN CERV TRACTION D (MISCELLANEOUS) ×1 IMPLANT
HEMOSTAT POWDER KIT SURGIFOAM (HEMOSTASIS) ×1 IMPLANT
KIT BASIN OR (CUSTOM PROCEDURE TRAY) ×1 IMPLANT
KIT TURNOVER KIT B (KITS) ×1 IMPLANT
NDL HYPO 22X1.5 SAFETY MO (MISCELLANEOUS) ×1 IMPLANT
NDL SPNL 22GX3.5 QUINCKE BK (NEEDLE) ×1 IMPLANT
NEEDLE HYPO 22X1.5 SAFETY MO (MISCELLANEOUS) ×1 IMPLANT
NEEDLE SPNL 22GX3.5 QUINCKE BK (NEEDLE) ×1 IMPLANT
NS IRRIG 1000ML POUR BTL (IV SOLUTION) ×1 IMPLANT
PACK LAMINECTOMY NEURO (CUSTOM PROCEDURE TRAY) ×1 IMPLANT
PAD ARMBOARD POSITIONER FOAM (MISCELLANEOUS) ×3 IMPLANT
PATTIES SURGICAL .5 X1 (DISPOSABLE) IMPLANT
PLATE ACP 1.6X32 2LVL (Plate) IMPLANT
SCREW ACP VA ST 3.5X15 (Screw) IMPLANT
SET WALTER ACTIVATION W/DRAPE (SET/KITS/TRAYS/PACK) ×1 IMPLANT
SPIKE FLUID TRANSFER (MISCELLANEOUS) ×1 IMPLANT
SPONGE INTESTINAL PEANUT (DISPOSABLE) ×1 IMPLANT
SUT VIC AB 4-0 RB1 18 (SUTURE) ×2 IMPLANT
TIP KERRISON THIN FOOTPLATE 3M (MISCELLANEOUS) IMPLANT
TOWEL GREEN STERILE (TOWEL DISPOSABLE) ×1 IMPLANT
TOWEL GREEN STERILE FF (TOWEL DISPOSABLE) ×1 IMPLANT
TUBING FEATHERFLOW (TUBING) ×1 IMPLANT
WATER STERILE IRR 1000ML POUR (IV SOLUTION) ×1 IMPLANT

## 2023-10-12 NOTE — Plan of Care (Signed)
  Problem: Education: Goal: Ability to describe self-care measures that may prevent or decrease complications (Diabetes Survival Skills Education) will improve Outcome: Progressing Goal: Individualized Educational Video(s) Outcome: Progressing   Problem: Coping: Goal: Ability to adjust to condition or change in health will improve Outcome: Progressing   Problem: Fluid Volume: Goal: Ability to maintain a balanced intake and output will improve Outcome: Progressing   Problem: Health Behavior/Discharge Planning: Goal: Ability to identify and utilize available resources and services will improve Outcome: Progressing Goal: Ability to manage health-related needs will improve Outcome: Progressing   Problem: Metabolic: Goal: Ability to maintain appropriate glucose levels will improve Outcome: Progressing   Problem: Nutritional: Goal: Maintenance of adequate nutrition will improve Outcome: Progressing Goal: Progress toward achieving an optimal weight will improve Outcome: Progressing   Problem: Skin Integrity: Goal: Risk for impaired skin integrity will decrease Outcome: Progressing   Problem: Tissue Perfusion: Goal: Adequacy of tissue perfusion will improve Outcome: Progressing   Problem: Education: Goal: Knowledge of General Education information will improve Description: Including pain rating scale, medication(s)/side effects and non-pharmacologic comfort measures Outcome: Progressing   Problem: Health Behavior/Discharge Planning: Goal: Ability to manage health-related needs will improve Outcome: Progressing   Problem: Clinical Measurements: Goal: Ability to maintain clinical measurements within normal limits will improve Outcome: Progressing Goal: Will remain free from infection Outcome: Progressing Goal: Diagnostic test results will improve Outcome: Progressing Goal: Respiratory complications will improve Outcome: Progressing Goal: Cardiovascular complication will  be avoided Outcome: Progressing   Problem: Activity: Goal: Risk for activity intolerance will decrease Outcome: Progressing   Problem: Nutrition: Goal: Adequate nutrition will be maintained Outcome: Progressing   Problem: Coping: Goal: Level of anxiety will decrease Outcome: Progressing   Problem: Elimination: Goal: Will not experience complications related to bowel motility Outcome: Progressing Goal: Will not experience complications related to urinary retention Outcome: Progressing   Problem: Pain Managment: Goal: General experience of comfort will improve and/or be controlled Outcome: Progressing   Problem: Safety: Goal: Ability to remain free from injury will improve Outcome: Progressing   Problem: Skin Integrity: Goal: Risk for impaired skin integrity will decrease Outcome: Progressing   Problem: Education: Goal: Ability to verbalize activity precautions or restrictions will improve Outcome: Progressing Goal: Knowledge of the prescribed therapeutic regimen will improve Outcome: Progressing Goal: Understanding of discharge needs will improve Outcome: Progressing   Problem: Activity: Goal: Ability to avoid complications of mobility impairment will improve Outcome: Progressing Goal: Ability to tolerate increased activity will improve Outcome: Progressing Goal: Will remain free from falls Outcome: Progressing   Problem: Bowel/Gastric: Goal: Gastrointestinal status for postoperative course will improve Outcome: Progressing   Problem: Clinical Measurements: Goal: Ability to maintain clinical measurements within normal limits will improve Outcome: Progressing Goal: Postoperative complications will be avoided or minimized Outcome: Progressing Goal: Diagnostic test results will improve Outcome: Progressing   Problem: Pain Management: Goal: Pain level will decrease Outcome: Progressing   Problem: Skin Integrity: Goal: Will show signs of wound  healing Outcome: Progressing   Problem: Health Behavior/Discharge Planning: Goal: Identification of resources available to assist in meeting health care needs will improve Outcome: Progressing

## 2023-10-12 NOTE — Progress Notes (Signed)
 PROGRESS NOTE    Christian Bailey  YQM:[id number] DOB: [birth year] DOA: 10/02/2023 PCP: Chilton Greathouse, MD    Brief Narrative:  83 year old with history of peripheral artery disease on Plavix and Crestor, right bundle branch block, chronic diastolic heart failure, CKD stage IV, ambulatory dysfunction, hypertension,  hyperlipidemia,  type 2 diabetes, chronic anxiety and depression and GERD presented to the emergency room with a fall.  Patient reportedly with worsening mobility since last 6 months.  In the emergency room hemodynamically stable.  Imaging consistent with posterior spinous ligament injury from C2-C5.  Difficult to tolerate pain medications and getting delirious with narcotics.  Followed by neurosurgery.  Also with leukocytosis. Patient developed severe agitation and hyper active delirium. 4/15, ultimately mental status improved so he was taken to operating room for cervical decompression.  Subjective:  No overnight events.  Patient was taken to surgery early morning hours.  Came back from procedure.  Patient is sleepy.  No family at the bedside.  Assessment & Plan:   Acute metabolic encephalopathy likely secondary to polypharmacy and narcotics: Hyperactive delirium secondary to multiple factors including hospital-acquired delirium.  History of delirium in 2022. Cervical myelopathy with spasticity and compression symptoms.  Mental status waxes and wanes.  Had developed severe delirium and hallucination.  Responded to haloperidol and Seroquel at night. 4/14, mental status has improved. Avoiding all sedatives.  Tylenol 1 g every 6 hours around-the-clock.  Baclofen tolerated well. Followed by neurosurgery.  With ongoing compressive symptoms, underwent cervical spine decompression and stabilization today. Judicious use of narcotics. Usage haloperidol and or Seroquel for delirium.  Prefer IV haloperidol.  Acute intractable pain, peripheral arterial disease: Continue Cymbalta, Tylenol  1 g every 6 hours, Lyrica 50 mg twice daily, Flexeril 5 mg 3 times daily.  Bowel regimen.  Today needs postop medications.  CKD stage IV: At about baseline.  Anxiety depression: On Cymbalta.  Leukocytosis: Patient presented with WBC count of 19,000.  Urine was clear.  No other clear source of infection.  Treated with Rocephin and azithromycin. WBC already trending down.   Procalcitonin 5.5.  WC count trending down. Cultures negative so far.  Normalized.  Completed antibiotics.  Essential hypertension: Blood pressure stable.  Holding lisinopril.  On Toprol-XL.  Type 2 diabetes: Known A1c 8.  On sliding scale.  Acute urinary retention: With altered mentation, he had to have multiple In-N-Out cath.  Currently has a Foley catheter.  Will remove once he is mobile after surgery.  Goal of care: Patient has gone through severe episodes of delirium and today improving. Today for surgery. Remains DNR. Hopefully he will stabilize enough after the surgery and go to CIR.    DVT prophylaxis: SCDs Start: 10/02/23 2244   Code Status: DNR with limited intervention. Family Communication: None today. Disposition Plan: Status is: Inpatient PatientRemains inpatient appropriate because: Immediate postop.     Consultants:  Neurosurgery Palliative care Psychiatry.  Procedures:  None  Antimicrobials:  Rocephin azithromycin 4/8--- 4/11     Objective: Vitals:   10/12/23 0325 10/12/23 0456 10/12/23 0638 10/12/23 1117  BP:  138/60 135/66 (!) 100/46  Pulse:  (!) 101 (!) 104 (!) 102  Resp:  19 18 11   Temp:  98.5 F (36.9 C) 97.9 F (36.6 C) (!) 97.5 F (36.4 C)  TempSrc:  Oral Oral   SpO2:  98% 96% (!) 88%  Weight: 63.7 kg  65.3 kg   Height:   5\' 5"  (1.651 m)     Intake/Output Summary (Last 24 hours) at  10/12/2023 1127 Last data filed at 10/12/2023 1057 Gross per 24 hour  Intake 1970 ml  Output 1800 ml  Net 170 ml   Filed Weights   10/11/23 0545 10/12/23 0325 10/12/23 0638   Weight: 64.3 kg 63.7 kg 65.3 kg    Examination: In morning rounds  General: Comfortably sleepy.  On soft collar. Cardiovascular: S1-S2 normal regular rate rhythm. Respiratory: Bilateral clear.  On room air. Gastrointestinal: Soft and nontender.  Bowel sound present. Ext: No swelling edema.    Data Reviewed: I have personally reviewed following labs and imaging studies  CBC: Recent Labs  Lab 10/05/23 1430 10/06/23 0521 10/07/23 0449 10/08/23 0400 10/09/23 0439  WBC 19.1* 14.2* 13.6* 13.2* 10.4  HGB 11.8* 10.0* 9.8* 10.5* 9.5*  HCT 34.6* 29.8* 29.5* 32.0* 28.2*  MCV 85.6 87.1 88.3 88.9 86.5  PLT 303 271 282 319 322   Basic Metabolic Panel: Recent Labs  Lab 10/05/23 1430 10/06/23 0521 10/07/23 0449 10/08/23 0400 10/09/23 0439  NA 132* 137 136 138 137  K 4.7 4.3 3.9 4.3 3.6  CL 99 103 103 107 104  CO2 21* 23 21* 17* 20*  GLUCOSE 168* 124* 123* 147* 124*  BUN 39* 46* 59* 51* 55*  CREATININE 2.28* 2.43* 2.85* 2.36* 2.26*  CALCIUM 9.0 8.5* 8.4* 8.9 8.8*   GFR: Estimated Creatinine Clearance: 21.9 mL/min (A) (by C-G formula based on SCr of 2.26 mg/dL (H)). Liver Function Tests: Recent Labs  Lab 10/05/23 1430 10/06/23 0521 10/07/23 0449 10/08/23 0400 10/09/23 0439  AST 25 16 19 29  34  ALT 17 12 12 20 23   ALKPHOS 66 54 47 69 62  BILITOT 1.0 0.7 0.5 1.0 0.7  PROT 7.5 6.3* 5.8* 6.4* 6.1*  ALBUMIN 3.5 2.8* 2.4* 2.6* 2.5*   No results for input(s): "LIPASE", "AMYLASE" in the last 168 hours. No results for input(s): "AMMONIA" in the last 168 hours. Coagulation Profile: No results for input(s): "INR", "PROTIME" in the last 168 hours. Cardiac Enzymes: No results for input(s): "CKTOTAL", "CKMB", "CKMBINDEX", "TROPONINI" in the last 168 hours. BNP (last 3 results) No results for input(s): "PROBNP" in the last 8760 hours. HbA1C: No results for input(s): "HGBA1C" in the last 72 hours. CBG: Recent Labs  Lab 10/11/23 0658 10/11/23 1151 10/11/23 1626  10/11/23 2023 10/12/23 0610  GLUCAP 149* 179* 184* 151* 151*   Lipid Profile: No results for input(s): "CHOL", "HDL", "LDLCALC", "TRIG", "CHOLHDL", "LDLDIRECT" in the last 72 hours. Thyroid Function Tests: No results for input(s): "TSH", "T4TOTAL", "FREET4", "T3FREE", "THYROIDAB" in the last 72 hours. Anemia Panel: No results for input(s): "VITAMINB12", "FOLATE", "FERRITIN", "TIBC", "IRON", "RETICCTPCT" in the last 72 hours. Sepsis Labs: Recent Labs  Lab 10/07/23 0449  PROCALCITON 5.56    Recent Results (from the past 240 hours)  Culture, blood (Routine X 2) w Reflex to ID Panel     Status: None   Collection Time: 10/05/23  2:30 PM   Specimen: BLOOD  Result Value Ref Range Status   Specimen Description BLOOD BLOOD LEFT HAND  Final   Special Requests   Final    BOTTLES DRAWN AEROBIC AND ANAEROBIC Blood Culture adequate volume   Culture   Final    NO GROWTH 5 DAYS Performed at [facility], [address], [place], [place] [address]    Report Status 10/10/2023 FINAL  Final  Culture, blood (Routine X 2) w Reflex to ID Panel     Status: None   Collection Time: 10/05/23  2:30 PM  Specimen: BLOOD LEFT HAND  Result Value Ref Range Status   Specimen Description BLOOD LEFT HAND  Final   Special Requests   Final    BOTTLES DRAWN AEROBIC AND ANAEROBIC Blood Culture adequate volume   Culture   Final    NO GROWTH 5 DAYS Performed at [facility], [address] [place], [place] [address]    Report Status 10/10/2023 FINAL  Final  Surgical pcr screen     Status: None   Collection Time: 10/12/23  3:24 AM   Specimen: Nasal Mucosa; Nasal Swab  Result Value Ref Range Status   MRSA, PCR NEGATIVE NEGATIVE Final   Staphylococcus aureus NEGATIVE NEGATIVE Final    Comment: (NOTE) The Xpert SA Assay (FDA approved for NASAL specimens in patients 82 years of age and older), is one component of a comprehensive surveillance program. It is not intended to diagnose infection nor  to guide or monitor treatment. Performed at [facility], [address] [place], [place] [address]          Radiology Studies: No results found.       Scheduled Meds:  [MAR Hold] acetaminophen  1,000 mg Oral Q6H   [MAR Hold] Chlorhexidine Gluconate Cloth  6 each Topical Daily   [MAR Hold] DULoxetine  20 mg Oral Daily   [MAR Hold] feeding supplement  237 mL Oral BID BM   [MAR Hold] ferrous sulfate  325 mg Oral Q breakfast   [MAR Hold] insulin aspart  0-5 Units Subcutaneous QHS   [MAR Hold] insulin aspart  0-9 Units Subcutaneous TID WC   [MAR Hold] metoprolol succinate  50 mg Oral Daily   [MAR Hold] mirabegron ER  50 mg Oral Daily   [MAR Hold] pantoprazole  40 mg Oral BID   [MAR Hold] pregabalin  50 mg Oral BID   [MAR Hold] rosuvastatin  20 mg Oral QPM   Continuous Infusions:  lactated ringers Stopped (10/12/23 1045)      LOS: 9 days    Time spent: 40 minutes    Keoni Risinger, MD [facility]

## 2023-10-12 NOTE — Transfer of Care (Signed)
 Immediate Anesthesia Transfer of Care Note  Patient: Hamish Banks  Procedure(s) Performed: ANTERIOR CERVICAL DECOMPRESSION VIA CORPECTOMY WITH RECONSTRUCTION INCLUDING ANTERIOR CERVICAL PLATING CERVICAL FIVE-SIX,CERVICAL SIX-SEVEN  Patient Location: PACU  Anesthesia Type:General  Level of Consciousness: drowsy and patient cooperative  Airway & Oxygen Therapy: Patient connected to nasal cannula oxygen  Post-op Assessment: Report given to RN and Post -op Vital signs reviewed and stable  Post vital signs: Reviewed and stable  Last Vitals:  Vitals Value Taken Time  BP 100/46 10/12/23 1117  Temp    Pulse 102 10/12/23 1119  Resp 14 10/12/23 1119  SpO2 93% 10/12/23 1119  Vitals shown include unfiled device data.  Last Pain:  Vitals:   10/12/23 0646  TempSrc:   PainSc: 0-No pain      Patients Stated Pain Goal: 0 (10/04/23 2240)  Complications: There were no known notable events for this encounter.

## 2023-10-12 NOTE — Progress Notes (Signed)
 Inpatient Rehab Admissions Coordinator:    CIR following. To OR today. Will see how he does post operatively with therapies and decide whether to pursue CIR admit.   Wandalee Gust, MS, CCC-SLP Rehab Admissions Coordinator  [phone] (celll) [phone] (office)

## 2023-10-12 NOTE — Op Note (Signed)
 Date of surgery: 10/12/2023 Preoperative diagnosis: Cervical spondylosis with myelopathy C5-6 and C6-C7. Postoperative diagnosis: Same Procedure: Anterior cervical decompression C5-6 and C6-7 with corpectomy of C6 reconstruction with peek cage and anterior plate fixation local autograft arthrodesis. Surgeon: Elna Haggis Anesthesia: General Endotracheal Indications: Patient is an 83 year old individual whose had increasing frequency of falls recent MRI demonstrates that he has severe cord compression at the levels of C5-6 and C6-C7 secondary to ossification of the posterior longitudinal ligament.  There are some early cord signal change at the C5-C6 level.  Because of this history and significant weakness with spasticity in his lower extremities we discussed and are now doing an anterior cervical decompression via corpectomy at the C6 vertebrae.  Procedure: Patient was brought to the operating room supine on the stretcher.  After the smooth induction of general tracheal anesthesia, he was carefully placed in 5 pounds of halter traction.  The neck was prepped with Hibiclens then DuraPrep and draped in a sterile fashion.  Transverse incision was made in the left side of the neck and carried down through the platysma.  The plane between the sternocleidomastoid and strap muscles was dissected bluntly until the prevertebral space was reached.  First identifiable vertebrae was that of C6.  Second radiograph was obtained when the disc space at C5-6 was entered to verified positioning.  Then complete discectomy was performed at C5-C6 level and the superior edges of the C6 vertebrae were removed.  Corpectomy was then performed harvesting the bone from the vertebral body for use and grafting.  The region of the posterior longitudinal ligament was drilled carefully and there was noted to be substantial ossification the posterior longitudinal ligament which was carefully removed.  Dissection from the dura was difficult  as the ossification was fairly densely adherent to the dura itself nonetheless were able to remove all the ossification without incurring any injury to the dura no CSF leaks were encountered.  The decompression was continued until we reached C7 disc space and the C6-C7 disc space was cleared completely.  Undercutting of C7 was performed substantially using 2 and a 3 mm Kerrison punch removing substantial osteophytic overgrowth.  Then by undercutting C5 superiorly we completed the decompression.  Hemostasis was carefully achieved and the endplates were then drilled smoothed and parallel.  Using a measuring device we fitted a 20 mm model with cage into the vertebral body of C6.  The cage itself was filled with the autograft that had previously been harvested this was countersunk just by 1 mm.  Then a 32 mm standard ACP plate was fitted to the ventral aspect of the vertebral bodies and secured with four 3-1/2 x 15 mm variable angle screws.  These were locked into position and final radiograph was obtained identifying good decompression and good placement of the hardware.  With this hemostasis in the soft tissues was obtained meticulously some additional bone graft was packed outside the cage under the plate and when completed and hemostasis was verified the platysma was closed with 4-0 Vicryl in interrupted fashion 4-0 Vicryl was used in the subcuticular skin.  Dermabond was placed on the skin.  Blood loss is estimated at 150 cc.

## 2023-10-12 NOTE — Anesthesia Preprocedure Evaluation (Signed)
 Anesthesia Evaluation    History of Anesthesia Complications (+) PONV, PROLONGED EMERGENCE and history of anesthetic complications  Airway Mallampati: IV  TM Distance: >3 FB Neck ROM: Limited    Dental  (+) Dental Advisory Given   Pulmonary former smoker   breath sounds clear to auscultation       Cardiovascular hypertension, + Peripheral Vascular Disease   Rhythm:Regular  Procedure narrative: Transthoracic echocardiography. Image    quality was poor. The study was technically difficult.    Intravenous contrast (Definity) was administered to opacify the    LV.  - Left ventricle: The cavity size was normal. There was mild focal    basal hypertrophy of the septum. Systolic function was normal.    The estimated ejection fraction was in the range of 60% to 65%.    Wall motion was normal; there were no regional wall motion    abnormalities.  - Aortic valve: Transvalvular velocity was within the normal range.    There was no stenosis. There was no regurgitation.  - Mitral valve: Transvalvular velocity was within the normal range.    There was no evidence for stenosis. There was trivial    regurgitation.  - Right ventricle: The cavity size was normal. Wall thickness was    normal. Systolic function was normal.  - Atrial septum: No defect or patent foramen ovale was identified.  - Tricuspid valve: There was trivial regurgitation.  - Pulmonary arteries: Systolic pressure was within the normal    range. PA peak pressure: 34 mm Hg (S).      Neuro/Psych    GI/Hepatic hiatal hernia, PUD,GERD  Medicated,,  Endo/Other  diabetes    Renal/GU Lab Results      Component                Value               Date                      NA                       137                 10/09/2023                K                        3.6                 10/09/2023                CO2                      20 (L)              10/09/2023                 GLUCOSE                  124 (H)             10/09/2023                BUN                      55 (H)  10/09/2023                CREATININE               2.26 (H)            10/09/2023                CALCIUM                  8.8 (L)             10/09/2023                GFRNONAA                 28 (L)              10/09/2023                Musculoskeletal   Abdominal   Peds  Hematology   Anesthesia Other Findings   Reproductive/Obstetrics                              Anesthesia Physical Anesthesia Plan  ASA: 4  Anesthesia Plan: General   Post-op Pain Management:    Induction:   PONV Risk Score and Plan: 4 or greater and Ondansetron and Dexamethasone  Airway Management Planned: Oral ETT and Video Laryngoscope Planned  Additional Equipment: None  Intra-op Plan:   Post-operative Plan: Possible Post-op intubation/ventilation  Informed Consent: I have reviewed the patients History and Physical, chart, labs and discussed the procedure including the risks, benefits and alternatives for the proposed anesthesia with the patient or authorized representative who has indicated his/her understanding and acceptance.    Discussed DNR with patient and Suspend DNR.   Dental advisory given  Plan Discussed with: CRNA  Anesthesia Plan Comments:          Anesthesia Quick Evaluation

## 2023-10-12 NOTE — Progress Notes (Signed)
 Orthopedic Tech Progress Note Patient Details:  Christian Bailey [birth year] [id number]  Patient has collar Patient ID: [name], male   DOB: Nov 26, 1940, 83 y.o.   MRN: [id number]  Kermitt Pedlar 10/12/2023, 1:47 PM

## 2023-10-12 NOTE — Progress Notes (Signed)
 OT Cancellation Note  Patient Details Name: Christian Bailey MRN: [id number] DOB: February 28, 1941   Cancelled Treatment:    Reason Eval/Treat Not Completed: Patient at procedure or test/ unavailable. Pt off unit at ACDF procedure. Will return as able to.  Elbie Statzer C, OT  Acute Rehabilitation Services Office [phone] Secure chat preferred   Mickael Alamo 10/12/2023, 10:26 AM

## 2023-10-12 NOTE — Interval H&P Note (Signed)
 History and Physical Interval Note:  10/12/2023 7:44 AM  Christian Bailey  has presented today for surgery, with the diagnosis of CERVICAL MYELOPATHY.  The various methods of treatment have been discussed with the patient and family. After consideration of risks, benefits and other options for treatment, the patient has consented to  Procedure(s) with comments: ANTERIOR CERVICAL CORPECTOMY (N/A) - CORPECTOMY C6 W/RECONSTRUCTION as a surgical intervention.  The patient's history has been reviewed, patient examined, no change in status, stable for surgery.  I have reviewed the patient's chart and labs.  Questions were answered to the patient's satisfaction.     Sela Daft

## 2023-10-12 NOTE — Anesthesia Procedure Notes (Signed)
 Procedure Name: Intubation Date/Time: 10/12/2023 8:04 AM  Performed by: Jamas Maywood, CRNAPre-anesthesia Checklist: Patient identified, Emergency Drugs available, Suction available and Patient being monitored Patient Re-evaluated:Patient Re-evaluated prior to induction Oxygen Delivery Method: Circle system utilized Preoxygenation: Pre-oxygenation with 100% oxygen Induction Type: IV induction Ventilation: Mask ventilation without difficulty and Oral airway inserted - appropriate to patient size Laryngoscope Size: Glidescope and 4 Grade View: Grade I Tube type: Oral Tube size: 7.5 mm Number of attempts: 1 Airway Equipment and Method: Oral airway and Rigid stylet Placement Confirmation: ETT inserted through vocal cords under direct vision, positive ETCO2 and breath sounds checked- equal and bilateral Secured at: 23 cm Tube secured with: Tape Dental Injury: Teeth and Oropharynx as per pre-operative assessment

## 2023-10-13 ENCOUNTER — Encounter (HOSPITAL_COMMUNITY): Payer: Self-pay | Admitting: Neurological Surgery

## 2023-10-13 ENCOUNTER — Telehealth: Payer: Self-pay | Admitting: Diagnostic Neuroimaging

## 2023-10-13 DIAGNOSIS — F05 Delirium due to known physiological condition: Secondary | ICD-10-CM | POA: Diagnosis not present

## 2023-10-13 LAB — GLUCOSE, CAPILLARY
Glucose-Capillary: 245 mg/dL — ABNORMAL HIGH (ref 70–99)
Glucose-Capillary: 148 mg/dL — ABNORMAL HIGH (ref 70–99)
Glucose-Capillary: 138 mg/dL — ABNORMAL HIGH (ref 70–99)
Glucose-Capillary: 209 mg/dL — ABNORMAL HIGH (ref 70–99)
Glucose-Capillary: 146 mg/dL — ABNORMAL HIGH (ref 70–99)

## 2023-10-13 NOTE — Plan of Care (Signed)
  Problem: Education: Goal: Ability to describe self-care measures that may prevent or decrease complications (Diabetes Survival Skills Education) will improve Outcome: Progressing Goal: Individualized Educational Video(s) Outcome: Progressing   Problem: Coping: Goal: Ability to adjust to condition or change in health will improve Outcome: Progressing   Problem: Fluid Volume: Goal: Ability to maintain a balanced intake and output will improve Outcome: Progressing   Problem: Health Behavior/Discharge Planning: Goal: Ability to identify and utilize available resources and services will improve Outcome: Progressing Goal: Ability to manage health-related needs will improve Outcome: Progressing   Problem: Metabolic: Goal: Ability to maintain appropriate glucose levels will improve Outcome: Progressing   Problem: Nutritional: Goal: Maintenance of adequate nutrition will improve Outcome: Progressing Goal: Progress toward achieving an optimal weight will improve Outcome: Progressing   Problem: Skin Integrity: Goal: Risk for impaired skin integrity will decrease Outcome: Progressing   Problem: Tissue Perfusion: Goal: Adequacy of tissue perfusion will improve Outcome: Progressing   Problem: Education: Goal: Knowledge of General Education information will improve Description: Including pain rating scale, medication(s)/side effects and non-pharmacologic comfort measures Outcome: Progressing   Problem: Health Behavior/Discharge Planning: Goal: Ability to manage health-related needs will improve Outcome: Progressing   Problem: Clinical Measurements: Goal: Ability to maintain clinical measurements within normal limits will improve Outcome: Progressing Goal: Will remain free from infection Outcome: Progressing Goal: Diagnostic test results will improve Outcome: Progressing Goal: Respiratory complications will improve Outcome: Progressing Goal: Cardiovascular complication will  be avoided Outcome: Progressing   Problem: Activity: Goal: Risk for activity intolerance will decrease Outcome: Progressing   Problem: Nutrition: Goal: Adequate nutrition will be maintained Outcome: Progressing   Problem: Coping: Goal: Level of anxiety will decrease Outcome: Progressing   Problem: Elimination: Goal: Will not experience complications related to bowel motility Outcome: Progressing Goal: Will not experience complications related to urinary retention Outcome: Progressing   Problem: Pain Managment: Goal: General experience of comfort will improve and/or be controlled Outcome: Progressing   Problem: Safety: Goal: Ability to remain free from injury will improve Outcome: Progressing   Problem: Skin Integrity: Goal: Risk for impaired skin integrity will decrease Outcome: Progressing   Problem: Education: Goal: Ability to verbalize activity precautions or restrictions will improve Outcome: Progressing Goal: Knowledge of the prescribed therapeutic regimen will improve Outcome: Progressing Goal: Understanding of discharge needs will improve Outcome: Progressing   Problem: Activity: Goal: Ability to avoid complications of mobility impairment will improve Outcome: Progressing Goal: Ability to tolerate increased activity will improve Outcome: Progressing Goal: Will remain free from falls Outcome: Progressing   Problem: Bowel/Gastric: Goal: Gastrointestinal status for postoperative course will improve Outcome: Progressing   Problem: Clinical Measurements: Goal: Ability to maintain clinical measurements within normal limits will improve Outcome: Progressing Goal: Postoperative complications will be avoided or minimized Outcome: Progressing Goal: Diagnostic test results will improve Outcome: Progressing   Problem: Pain Management: Goal: Pain level will decrease Outcome: Progressing

## 2023-10-13 NOTE — Evaluation (Signed)
 Occupational Therapy Evaluation Patient Details Name: Christian Bailey MRN: [id number] DOB: 02-20-1941 Today's Date: 10/13/2023   History of Present Illness   Patient is an 83 yo male presenting to the ED status post fall on 10/02/23. MRI finding edematous changes from C2-C5. Compression of the spinal cord at C5-6 and C6-7. Moderate foraminal stenosis bilaterally at C6-7 and on the right at C7-T1.  Mild foraminal narrowing bilaterally at C2-3 and C3-4. ACDF performed 4/15. PMH includes:  peripheral artery disease on Plavix and Crestor, RBBB, chronic HFpEF, CKD 4, carotid stenosis, hypertension, hyperlipidemia, type 2 diabetes, chronic anxiety/depression, iron deficiency anemia, GERDsecondary to disc disease and ossification of posterior longitudinal ligament.  Moderate foraminal stenosis bilaterally at C6-7 and on the right at C7-T1.  Mild foraminal narrowing bilaterally at C2-3 and C3-4; AIR following for likely post-acute rehab; Neurosurgery consulted, considering cervical decompression surgery;  PMH includes:  peripheral artery disease on Plavix and Crestor, RBBB, chronic HFpEF, CKD 4, carotid stenosis, hypertension, hyperlipidemia, type 2 diabetes, chronic anxiety/depression, iron deficiency anemia, GERD     Clinical Impressions Pt admitted based on above, and was seen based on problem list below. PTA pt was receiving min assist with ADLs and IADLs. Today pt is requiring set up  to total +2 for ADLs. Bed mobility was max +2 and functional transfers are  total +2. Pt's cog seems improved from previous sessions, no reports of hallucinations. Recommendation of >3 hours of skilled rehab daily to optimize independence levels. OT will continue to follow acutely to maximize functional independence.        If plan is discharge home, recommend the following:   Two people to help with walking and/or transfers;Assistance with cooking/housework;Assist for transportation;Help with stairs or ramp for  entrance;Two people to help with bathing/dressing/bathroom     Functional Status Assessment   Patient has had a recent decline in their functional status and demonstrates the ability to make significant improvements in function in a reasonable and predictable amount of time.     Equipment Recommendations   Other (comment) (Defer to next venue)      Precautions/Restrictions   Precautions Precautions: Fall;Cervical Precaution Booklet Issued: No Recall of Precautions/Restrictions: Intact Required Braces or Orthoses: Cervical Brace Cervical Brace: Soft collar Restrictions Weight Bearing Restrictions Per Provider Order: No     Mobility Bed Mobility Overal bed mobility: Needs Assistance Bed Mobility: Rolling, Sidelying to Sit Rolling: Max assist, +2 for physical assistance Sidelying to sit: Max assist, +2 for safety/equipment, HOB elevated, Used rails            Transfers Overall transfer level: Needs assistance Equipment used: Rolling walker (2 wheels), Ambulation equipment used Transfers: Bed to chair/wheelchair/BSC, Sit to/from Stand Sit to Stand: Max assist, +2 physical assistance Stand pivot transfers: Total assist, +2 physical assistance (+3 with use of RW)         General transfer comment: Pt unable to balance with feet on floor Transfer via Lift Equipment: Stedy    Balance Overall balance assessment: Needs assistance Sitting-balance support: Bilateral upper extremity supported, Feet supported Sitting balance-Leahy Scale: Poor   Postural control: Posterior lean Standing balance support: Bilateral upper extremity supported, During functional activity Standing balance-Leahy Scale: Zero         ADL either performed or assessed with clinical judgement   ADL Overall ADL's : Needs assistance/impaired Eating/Feeding: Set up;Sitting   Grooming: Wash/dry face;Oral care;Set up;Sitting   Upper Body Bathing: Sitting;Set up   Lower Body Bathing: Maximal  assistance;+2 for physical assistance;Sit  to/from stand   Upper Body Dressing : Set up;Sitting   Lower Body Dressing: Total assistance;+2 for physical assistance;Sit to/from stand   Toilet Transfer: Stand-pivot;Total assistance;+2 for physical assistance;Rolling walker (2 wheels);BSC/3in1 (+3 for SPT to [facility]) Toilet Transfer Details (indicate cue type and reason): +3 SPT  with RW, would be +2 with stedy Toileting- Clothing Manipulation and Hygiene: Total assistance;+2 for physical assistance;+2 for safety/equipment;Sit to/from stand Toileting - Clothing Manipulation Details (indicate cue type and reason): +3 using stedy, 2 person to assist with stand 1 to wipe     Functional mobility during ADLs: Maximal assistance;+2 for physical assistance;+2 for safety/equipment;Rolling walker (2 wheels)       Vision Baseline Vision/History: 1 Wears glasses Vision Assessment?: No apparent visual deficits            Pertinent Vitals/Pain Pain Assessment Pain Assessment: Faces Faces Pain Scale: Hurts little more Pain Location: neck Pain Descriptors / Indicators: Grimacing, Guarding Pain Intervention(s): Monitored during session, RN gave pain meds during session     Extremity/Trunk Assessment Upper Extremity Assessment Upper Extremity Assessment: Generalized weakness (Bicep strength 5/5, decreased grip strength bil)   Lower Extremity Assessment Lower Extremity Assessment: Defer to PT evaluation   Cervical / Trunk Assessment Cervical / Trunk Assessment: Neck Surgery   Communication Communication Communication: No apparent difficulties Factors Affecting Communication: Hearing impaired   Cognition Arousal: Alert Behavior During Therapy: WFL for tasks assessed/performed Cognition: No apparent impairments     Following commands: Intact       Cueing  General Comments   Cueing Techniques: Verbal cues;Tactile cues  Pt had bowel movement upon mobility, wife present for session, cog  seems improved   Exercises Exercises: Hand exercises Hand Exercises Digit Composite Flexion: Both, 10 reps, Squeeze ball, Other (comment), Strengthening, AROM (Foam squeeze for L hand)        Home Living Family/patient expects to be discharged to:: Private residence Living Arrangements: Spouse/significant other Available Help at Discharge: Family;Available 24 hours/day Type of Home: House Home Access: Stairs to enter [employer] of Steps: 3 Entrance Stairs-Rails: Left Home Layout: One level     Bathroom Shower/Tub: Producer, television/film/video: Handicapped height Bathroom Accessibility: Yes How Accessible: Accessible via walker Home Equipment: Rolling Walker (2 wheels);Rollator (4 wheels);Shower seat - built Insurance claims handler        Prior Functioning/Environment Prior Level of Function : Needs assist;History of Falls (last six months)             Mobility Comments: pt using RW in home, rollator in community. sideways on stairs for BUE support on rail. reports sit-stand can take 5-6 tries ADLs Comments: wife recently has helped with sponge bath, typically pt gets in shower. Pt recently fell putting on underwear while standing. Pt reports use of sock aid to donn socks    OT Problem List: Decreased strength;Decreased range of motion;Decreased activity tolerance;Impaired balance (sitting and/or standing);Decreased coordination;Decreased safety awareness;Decreased knowledge of use of DME or AE;Pain   OT Treatment/Interventions: Self-care/ADL training;Therapeutic exercise;Energy conservation;DME and/or AE instruction;Therapeutic activities;Patient/family education;Balance training;Manual therapy      OT Goals(Current goals can be found in the care plan section)   Acute Rehab OT Goals Patient Stated Goal: To get better OT Goal Formulation: With patient Time For Goal Achievement: 10/17/23 Potential to Achieve Goals:  Fair   OT Frequency:  Min 2X/week    Co-evaluation   Reason for Co-Treatment: For patient/therapist safety;To address functional/ADL transfers   OT goals addressed  during session: ADL's and self-care;Strengthening/ROM      AM-PAC OT "6 Clicks" Daily Activity     Outcome Measure Help from another person eating meals?: A Little Help from another person taking care of personal grooming?: A Little Help from another person toileting, which includes using toliet, bedpan, or urinal?: Total Help from another person bathing (including washing, rinsing, drying)?: A Lot Help from another person to put on and taking off regular upper body clothing?: A Little Help from another person to put on and taking off regular lower body clothing?: Total 6 Click Score: 13   End of Session Equipment Utilized During Treatment: Gait belt;Rolling walker (2 wheels);Other (comment) (stedy) Nurse Communication: Mobility status;Need for lift equipment  Activity Tolerance: Patient tolerated treatment well Patient left: in chair;with call bell/phone within reach;with chair alarm set;with family/visitor present  OT Visit Diagnosis: Unsteadiness on feet (R26.81);Other abnormalities of gait and mobility (R26.89);Repeated falls (R29.6);Muscle weakness (generalized) (M62.81);History of falling (Z91.81);Pain                Time: 1213-1252 OT Time Calculation (min): 39 min Charges:  OT General Charges $OT Visit: 1 Visit OT Evaluation $OT Re-eval: 1 Re-eval OT Treatments $Self Care/Home Management : 8-22 mins  Delmer Ferraris, OT  Acute Rehabilitation Services Office [phone] Secure chat preferred   Mickael Alamo 10/13/2023, 2:41 PM

## 2023-10-13 NOTE — Progress Notes (Signed)
 Inpatient Rehab Admissions Coordinator:   CIR following. Note surgery completed yesterday and Pt. Cleared for mobility. Will see how he does with therapies post operatively and consider admission later this week of he appears able to tolerate.   Wandalee Gust, MS, CCC-SLP Rehab Admissions Coordinator  [phone] (celll) [phone] (office)

## 2023-10-13 NOTE — Progress Notes (Signed)
 PROGRESS NOTE    Christian Bailey  ZOX:[id number] DOB: 20-Feb-1941 DOA: 10/02/2023 PCP: Chilton Greathouse, MD    Brief Narrative:  83 year old with history of peripheral artery disease on Plavix and Crestor, right bundle branch block, chronic diastolic heart failure, CKD stage IV, ambulatory dysfunction, hypertension,  hyperlipidemia,  type 2 diabetes, chronic anxiety and depression and GERD presented to the emergency room with a fall.  Patient reportedly with worsening mobility since last 6 months.  In the emergency room hemodynamically stable.  Imaging consistent with posterior spinous ligament injury from C2-C5.  Difficult to tolerate pain medications and getting delirious with narcotics.  Followed by neurosurgery.  Also with leukocytosis. Patient developed severe agitation and hyper active delirium. 4/15, ultimately mental status improved so he was taken to operating room for cervical decompression.  Subjective:  Patient was seen and examined.  Today he was alert awake and oriented.  He was very calm and interactive.  He was appreciative of surgery.  He thinks he is feeling better and able to move his legs.  Mild to moderate pain persist.  No other overnight events.  Assessment & Plan:   Acute metabolic encephalopathy likely secondary to polypharmacy and narcotics: Hyperactive delirium secondary to multiple factors including hospital-acquired delirium.  History of delirium in 2022. Cervical myelopathy with spasticity and compression symptoms.  Mental status waxes and wanes.  Had developed severe delirium and hallucination.  Responded to haloperidol and Seroquel at night. 4/14-4/16, mental status has improved.  Avoiding sedatives. Tylenol 1 g every 6 hours around-the-clock.  Baclofen tolerated well. Followed by neurosurgery.  With ongoing compressive symptoms, underwent cervical spine decompression and stabilization. Judicious use of narcotics. Usage haloperidol and or Seroquel for delirium.   Prefer IV haloperidol. Start mobilizing with PT OT and referred to CIR.  Acute intractable pain, peripheral arterial disease: Continue Cymbalta, Tylenol 1 g every 6 hours, Lyrica 50 mg twice daily, Flexeril 5 mg 3 times daily.  Bowel regimen.   CKD stage IV: At about baseline.  Anxiety depression: On Cymbalta.  Leukocytosis: Patient presented with WBC count of 19,000.  Urine was clear.  No other clear source of infection.  Treated with Rocephin and azithromycin. WBC already trending down.   Procalcitonin 5.5.  WC count trending down. Cultures negative so far.  Normalized.  Completed antibiotics.  Essential hypertension: Blood pressure stable.  Holding lisinopril.  On Toprol-XL.  Type 2 diabetes: Known A1c 8.  On sliding scale.  Acute urinary retention: With altered mentation, he had to have multiple In-N-Out cath.  Currently has a Foley catheter.  Will remove once he is mobile.   Goal of care: Patient had gone through severe episodes of delirium.  Improving now. Remains DNR. Hopefully he will stabilize enough after the surgery and go to CIR.    DVT prophylaxis: SCD's Start: 10/12/23 1208 SCDs Start: 10/02/23 2244   Code Status: DNR with limited intervention. Family Communication: None today. Disposition Plan: Status is: Inpatient PatientRemains inpatient appropriate because: Immediate postop.  Needs rehab.     Consultants:  Neurosurgery Palliative care Psychiatry.  Procedures:  None  Antimicrobials:  Rocephin azithromycin 4/8--- 4/11     Objective: Vitals:   10/12/23 2034 10/13/23 0026 10/13/23 0545 10/13/23 0830  BP: (!) 113/55 (!) 108/48 (!) 118/56 (!) 111/47  Pulse: (!) 115 (!) 104 (!) 105 90  Resp: 17 17 17 17   Temp: (!) 97.3 F (36.3 C) 98.6 F (37 C) 98.7 F (37.1 C) 98.2 F (36.8 C)  TempSrc: Oral Oral  Oral Oral  SpO2: 96% 97% 96% 95%  Weight:   65.3 kg   Height:        Intake/Output Summary (Last 24 hours) at 10/13/2023 1258 Last data filed  at 10/13/2023 0544 Gross per 24 hour  Intake --  Output 800 ml  Net -800 ml   Filed Weights   10/12/23 0325 10/12/23 0638 10/13/23 0545  Weight: 63.7 kg 65.3 kg 65.3 kg    Examination: In morning rounds  General: Comfortably sleepy.  On soft collar.  Alert awake and able to answer appropriately. Neck incisions clean and dry. Dressings intact Cardiovascular: S1-S2 normal regular rate rhythm. Respiratory: Bilateral clear.  On room air. Gastrointestinal: Soft and nontender.  Bowel sound present. Ext: No swelling edema. Patient has generalized weakness of all extremities.  He is able to better use his legs today.    Data Reviewed: I have personally reviewed following labs and imaging studies  CBC: Recent Labs  Lab 10/07/23 0449 10/08/23 0400 10/09/23 0439  WBC 13.6* 13.2* 10.4  HGB 9.8* 10.5* 9.5*  HCT 29.5* 32.0* 28.2*  MCV 88.3 88.9 86.5  PLT 282 319 322   Basic Metabolic Panel: Recent Labs  Lab 10/07/23 0449 10/08/23 0400 10/09/23 0439  NA 136 138 137  K 3.9 4.3 3.6  CL 103 107 104  CO2 21* 17* 20*  GLUCOSE 123* 147* 124*  BUN 59* 51* 55*  CREATININE 2.85* 2.36* 2.26*  CALCIUM 8.4* 8.9 8.8*   GFR: Estimated Creatinine Clearance: 21.9 mL/min (A) (by C-G formula based on SCr of 2.26 mg/dL (H)). Liver Function Tests: Recent Labs  Lab 10/07/23 0449 10/08/23 0400 10/09/23 0439  AST 19 29 34  ALT 12 20 23   ALKPHOS 47 69 62  BILITOT 0.5 1.0 0.7  PROT 5.8* 6.4* 6.1*  ALBUMIN 2.4* 2.6* 2.5*   No results for input(s): "LIPASE", "AMYLASE" in the last 168 hours. No results for input(s): "AMMONIA" in the last 168 hours. Coagulation Profile: No results for input(s): "INR", "PROTIME" in the last 168 hours. Cardiac Enzymes: No results for input(s): "CKTOTAL", "CKMB", "CKMBINDEX", "TROPONINI" in the last 168 hours. BNP (last 3 results) No results for input(s): "PROBNP" in the last 8760 hours. HbA1C: No results for input(s): "HGBA1C" in the last 72  hours. CBG: Recent Labs  Lab 10/12/23 1607 10/12/23 2111 10/12/23 2311 10/13/23 0616 10/13/23 1207  GLUCAP 167* 245* 263* 148* 138*   Lipid Profile: No results for input(s): "CHOL", "HDL", "LDLCALC", "TRIG", "CHOLHDL", "LDLDIRECT" in the last 72 hours. Thyroid Function Tests: No results for input(s): "TSH", "T4TOTAL", "FREET4", "T3FREE", "THYROIDAB" in the last 72 hours. Anemia Panel: No results for input(s): "VITAMINB12", "FOLATE", "FERRITIN", "TIBC", "IRON", "RETICCTPCT" in the last 72 hours. Sepsis Labs: Recent Labs  Lab 10/07/23 0449  PROCALCITON 5.56    Recent Results (from the past 240 hours)  Culture, blood (Routine X 2) w Reflex to ID Panel     Status: None   Collection Time: 10/05/23  2:30 PM   Specimen: BLOOD  Result Value Ref Range Status   Specimen Description BLOOD BLOOD LEFT HAND  Final   Special Requests   Final    BOTTLES DRAWN AEROBIC AND ANAEROBIC Blood Culture adequate volume   Culture   Final    NO GROWTH 5 DAYS Performed at [facility], [address], [place], [place] [address]    Report Status 10/10/2023 FINAL  Final  Culture, blood (Routine X 2) w Reflex to ID Panel  Status: None   Collection Time: 10/05/23  2:30 PM   Specimen: BLOOD LEFT HAND  Result Value Ref Range Status   Specimen Description BLOOD LEFT HAND  Final   Special Requests   Final    BOTTLES DRAWN AEROBIC AND ANAEROBIC Blood Culture adequate volume   Culture   Final    NO GROWTH 5 DAYS Performed at [facility], [address], [place], [place] [address]    Report Status 10/10/2023 FINAL  Final  Surgical pcr screen     Status: None   Collection Time: 10/12/23  3:24 AM   Specimen: Nasal Mucosa; Nasal Swab  Result Value Ref Range Status   MRSA, PCR NEGATIVE NEGATIVE Final   Staphylococcus aureus NEGATIVE NEGATIVE Final    Comment: (NOTE) The Xpert SA Assay (FDA approved for NASAL specimens in patients 50 years of age and older), is one component of a  comprehensive surveillance program. It is not intended to diagnose infection nor to guide or monitor treatment. Performed at [facility], [address] [place], [place] [address]          Radiology Studies: DG Cervical Spine Complete Result Date: 10/12/2023 CLINICAL DATA:  Intraoperative images of anterior cervical discectomy and fusion EXAM: CERVICAL SPINE - COMPLETE 4 VIEW COMPARISON:  CT cervical spine dated 10/02/2023 FINDINGS: Cross-table lateral views of the cervical spine. Endotracheal tube tip terminates at the level of T3-4. Straightening of the cervical lordosis. Multilevel degenerative changes of the cervical spine, better evaluated on prior CT. Sequential images demonstrate localization of the C5-6 intervertebral disc space with a subsequent anterior fusion of C5-C7. IMPRESSION: Intraoperative images of the cervical spine for anterior cervical discectomy and fusion spanning C5-C7. Electronically Signed   By: Limin  Xu M.D.   On: 10/12/2023 14:44         Scheduled Meds:  acetaminophen  1,000 mg Oral Q6H   Chlorhexidine Gluconate Cloth  6 each Topical Daily   docusate sodium  100 mg Oral BID   DULoxetine  20 mg Oral Daily   feeding supplement  237 mL Oral BID BM   ferrous sulfate  325 mg Oral Q breakfast   insulin aspart  0-5 Units Subcutaneous QHS   insulin aspart  0-9 Units Subcutaneous TID WC   metoprolol succinate  50 mg Oral Daily   mirabegron ER  50 mg Oral Daily   pantoprazole  40 mg Oral BID   pregabalin  50 mg Oral BID   rosuvastatin  20 mg Oral QPM   senna  1 tablet Oral BID   sodium chloride flush  3 mL Intravenous Q12H   Continuous Infusions:   ceFAZolin (ANCEF) IV 2 g (10/12/23 2216)      LOS: 10 days    Time spent: 40 minutes    Vada Garibaldi, MD [facility]

## 2023-10-13 NOTE — Care Management Important Message (Signed)
 Important Message  Patient Details  Name: Christian Bailey MRN: [id number] Date of Birth: 1941-02-05   Important Message Given:  Yes - Medicare IM     Felix Host 10/13/2023, 12:31 PM

## 2023-10-13 NOTE — Progress Notes (Signed)
 Daily Progress Note   Patient Name: Christian Bailey       Date: 10/13/2023 DOB: [birth year]  Age: 83 y.o. MRN#: [id number] Attending Physician: Vada Garibaldi, MD Primary Care Physician: Lonzie Robins, MD Admit Date: 10/02/2023  Reason for Consultation/Follow-up: Establishing goals of care  Subjective: Medical records reviewed including progress notes, labs, and imaging. Patient assessed at the bedside.  He is resting comfortably.  Did not attempt to arouse in order to preserve comfort.  No family present admitted.  Called patient's wife Christian Bailey today for ongoing palliative support.  She is feeling positively after his surgery and has no other outstanding needs.  She remains hopeful for CIR.  Questions and concerns addressed. PMT will continue to support holistically.   Length of Stay: 10   Physical Exam Vitals and nursing note reviewed.  Constitutional:      General: He is not in acute distress.    Appearance: He is ill-appearing.     Interventions: Cervical collar in place.  Cardiovascular:     Rate and Rhythm: Normal rate.  Pulmonary:     Effort: Pulmonary effort is normal.  Neurological:     Mental Status: He is alert.            Vital Signs: BP (!) 111/47 (BP Location: Right Arm)   Pulse 90   Temp 98.2 F (36.8 C) (Oral)   Resp 17   Ht 5\' 5"  (1.651 m)   Wt 65.3 kg   SpO2 95%   BMI 23.96 kg/m  SpO2: SpO2: 95 % O2 Device: O2 Device: Room Air O2 Flow Rate: O2 Flow Rate (L/min): 2 L/min      Palliative Assessment/Data: TBD   Palliative Care Assessment & Plan   Patient Profile: 83 y.o. male  with past medical history of peripheral artery disease on Plavix and Crestor, right bundle branch block, chronic diastolic heart failure, CKD stage IV, ambulatory dysfunction, hypertension hyperlipidemia type 2 diabetes, chronic  anxiety and depression and GERD admitted on 10/02/2023 with fall and head injury.    In the ED, MRI cervical spine without contrast showed edematous changes along the posterior spinous ligament from C2-C5 compatible with acute ligamentous injury.  Diffuse edematous changes within the posterior paraspinous musculature bilaterally throughout the cervical spine compatible with muscle strain.  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  Moderate foraminal stenosis bilaterally at C6-7 and on the right at C7-T1.  Mild foraminal narrowing bilaterally at C2-3 and C3-4.  He was also found to have AKI on CKD 4.   Family has been discussing option of surgical decompression with neurosurgeon. PMT has been consulted to assist with goals of care conversation.  Assessment: Goals of care conversation Acute metabolic encephalopathy, improving Cervical myelopathy with spasticity Fall  Recommendations/Plan: Continue DNR/DNI Goals of care are for rehab (CIR preferred) and as much improvement of functioning as patient can accomplish Continue current care plan Psychosocial and emotional support provided PMT remains available as needed   Prognosis:  Unable to determine  Discharge Planning: To Be Determined  Care plan was discussed with patient's wife   Total time: I spent  25 minutes in the care of the patient today in the above activities and documenting the encounter.   Rodman Recupero P Nyanna Heideman, PA-C  Palliative Medicine Team Team phone # [phone]  Thank you for allowing the Palliative Medicine Team to assist in the care of this patient. Please utilize secure chat with additional questions, if there is no response within 30 minutes please call the above phone number.  Palliative Medicine Team providers are available by phone from 7am to 7pm daily and can be reached through the team cell phone.  Should this  patient require assistance outside of these hours, please call the patient's attending physician.

## 2023-10-13 NOTE — Telephone Encounter (Signed)
 Wife reports appointment not needed at this time.

## 2023-10-13 NOTE — Evaluation (Signed)
 Physical Therapy Re-Evaluation Patient Details Name: Christian Bailey MRN: [id number] DOB: 19-Oct-1940 Today's Date: 10/13/2023  History of Present Illness  Patient is an 83 yo male presenting to the ED status post fall on 10/02/23. MRI finding edematous changes from C2-C5. Compression of the spinal cord at C5-6 and C6-7. Moderate foraminal stenosis bilaterally at C6-7 and on the right at C7-T1.  Mild foraminal narrowing bilaterally at C2-3 and C3-4. ACDF performed 4/15. PMH includes:  peripheral artery disease on Plavix and Crestor, RBBB, chronic HFpEF, CKD 4, carotid stenosis, hypertension, hyperlipidemia, type 2 diabetes, chronic anxiety/depression, iron deficiency anemia, GERDsecondary to disc disease and ossification of posterior longitudinal ligament.  Moderate foraminal stenosis bilaterally at C6-7 and on the right at C7-T1.  Mild foraminal narrowing bilaterally at C2-3 and C3-4; AIR following for likely post-acute rehab; Neurosurgery consulted, considering cervical decompression surgery;  PMH includes:  peripheral artery disease on Plavix and Crestor, RBBB, chronic HFpEF, CKD 4, carotid stenosis, hypertension, hyperlipidemia, type 2 diabetes, chronic anxiety/depression, iron deficiency anemia, GERD  Clinical Impression   Continuing work on functional mobility and activity tolerance;  Session focused on re-evaluation postop, and pt seen in conjunction with OT; Notably improved quality of movement Bil LEs, with no clonus or spasm noted throughout session; Christian Bailey continues to require up to Max/Total assist of 2 with functional mobility tasks; He is motivated to move better and return to his more independent PLOF; Participating well and engaged in conversation well (including a small joke-telling session while on the [facility]); Patient will benefit from intensive inpatient follow-up therapy, >3 hours/day       If plan is discharge home, recommend the following: Two people to help with walking and/or  transfers;Two people to help with bathing/dressing/bathroom;Help with stairs or ramp for entrance;Assist for transportation;Assistance with cooking/housework   Can travel by private vehicle   No    Equipment Recommendations Wheelchair (measurements PT);Wheelchair cushion (measurements PT);Hospital bed  Recommendations for Other Services  Rehab consult    Functional Status Assessment Patient has had a recent decline in their functional status and demonstrates the ability to make significant improvements in function in a reasonable and predictable amount of time.     Precautions / Restrictions Precautions Precautions: Fall;Cervical Precaution Booklet Issued: No Recall of Precautions/Restrictions: Intact Required Braces or Orthoses: Cervical Brace Cervical Brace: Soft collar Restrictions Weight Bearing Restrictions Per Provider Order: No      Mobility  Bed Mobility Overal bed mobility: Needs Assistance Bed Mobility: Rolling, Sidelying to Sit Rolling: Max assist, +2 for physical assistance Sidelying to sit: Max assist, +2 for safety/equipment, HOB elevated, Used rails       General bed mobility comments: max assist with trunk and BLEs    Transfers Overall transfer level: Needs assistance Equipment used: Rolling walker (2 wheels), Ambulation equipment used Transfers: Bed to chair/wheelchair/BSC, Sit to/from Stand Sit to Stand: Max assist, +2 physical assistance Stand pivot transfers: Total assist, +2 physical assistance (+3 with use of RW)         General transfer comment: Pt unable to balance with feet on floor; reauired Bil support for transfer bed to [facility], and tehn BSC to recliner via stedy Transfer via Lift Equipment: Stedy  Ambulation/Gait                  Stairs            Wheelchair Mobility     Tilt Bed    Modified Rankin (Stroke Patients Only)  Balance Overall balance assessment: Needs assistance Sitting-balance support: Bilateral  upper extremity supported, Feet supported Sitting balance-Christian Bailey Scale: Poor   Postural control: Posterior lean Standing balance support: Bilateral upper extremity supported, During functional activity Standing balance-Christian Bailey Scale: Zero                               Pertinent Vitals/Pain Pain Assessment Pain Assessment: Faces Faces Pain Scale: Hurts little more Pain Location: neck Pain Descriptors / Indicators: Grimacing, Guarding Pain Intervention(s): Monitored during session    Home Living Family/patient expects to be discharged to:: Private residence Living Arrangements: Spouse/significant other Available Help at Discharge: Family;Available 24 hours/day Type of Home: House Home Access: Stairs to enter Entrance Stairs-Rails: Left Entrance Stairs-Number of Steps: 3   Home Layout: One level Home Equipment: Agricultural consultant (2 wheels);Rollator (4 wheels);Shower seat - built Scientist, clinical (histocompatibility and immunogenetics)      Prior Function Prior Level of Function : Needs assist;History of Falls (last six months)             Mobility Comments: pt using RW in home, rollator in community. sideways on stairs for BUE support on rail. reports sit-stand can take 5-6 tries ADLs Comments: wife recently has helped with sponge bath, typically pt gets in shower. Pt recently fell putting on underwear while standing. Pt reports use of sock aid to donn socks     Extremity/Trunk Assessment   Upper Extremity Assessment Upper Extremity Assessment: Defer to OT evaluation    Lower Extremity Assessment Lower Extremity Assessment: Generalized weakness (No clonus or LE spasm L or R observed throughout session)    Cervical / Trunk Assessment Cervical / Trunk Assessment: Neck Surgery  Communication   Communication Communication: No apparent difficulties Factors Affecting Communication: Hearing impaired    Cognition Arousal: Alert Behavior During Therapy: WFL for tasks assessed/performed                              Following commands: Intact       Cueing Cueing Techniques: Verbal cues, Tactile cues     General Comments General comments (skin integrity, edema, etc.): Wife, [name], present and supportive; began to stool with sit <>stand, so assisted to [facility] (and assisted with hygeine) before getting to recliner with stedy    Exercises Hand Exercises Digit Composite Flexion: Both, 10 reps, Squeeze ball, Other (comment), Strengthening, AROM (Foam squeeze for L hand)   Assessment/Plan    PT Assessment Patient needs continued PT services  PT Problem List Decreased strength;Decreased balance;Decreased knowledge of precautions;Pain;Decreased mobility;Decreased activity tolerance;Decreased safety awareness       PT Treatment Interventions DME instruction;Functional mobility training;Balance training;Patient/family education;Gait training;Therapeutic activities;Stair training;Therapeutic exercise    PT Goals (Current goals can be found in the Care Plan section)  Acute Rehab PT Goals Patient Stated Goal: home PT Goal Formulation: With patient Time For Goal Achievement: 10/17/23 Potential to Achieve Goals: Good    Frequency Min 2X/week     Co-evaluation PT/OT/SLP Co-Evaluation/Treatment: Yes Reason for Co-Treatment: For patient/therapist safety;To address functional/ADL transfers PT goals addressed during session: Mobility/safety with mobility;Balance OT goals addressed during session: ADL's and self-care;Strengthening/ROM       AM-PAC PT "6 Clicks" Mobility  Outcome Measure Help needed turning from your back to your side while in a flat bed without using bedrails?: Total Help needed moving from lying on your back to sitting on the side of a flat  bed without using bedrails?: Total Help needed moving to and from a bed to a chair (including a wheelchair)?: Total Help needed standing up from a chair using your arms (e.g., wheelchair or bedside chair)?:  Total Help needed to walk in hospital room?: Total Help needed climbing 3-5 steps with a railing? : Total 6 Click Score: 6    End of Session Equipment Utilized During Treatment: Cervical collar Activity Tolerance: Patient tolerated treatment well Patient left: in bed;with call bell/phone within reach;with bed alarm set;with nursing/sitter in room Nurse Communication: Mobility status PT Visit Diagnosis: Other abnormalities of gait and mobility (R26.89);Muscle weakness (generalized) (M62.81);Repeated falls (R29.6);Pain Pain - part of body:  (Neck, Back, bil LEs)    Time: 2952-8413 PT Time Calculation (min) (ACUTE ONLY): 37 min   Charges:   PT Evaluation $PT Re-evaluation: 1 Re-eval   PT General Charges $$ ACUTE PT VISIT: 1 Visit         Darcus Eastern, PT  Acute Rehabilitation Services Office [phone] Secure Chat welcomed   Marcial Setting 10/13/2023, 3:40 PM

## 2023-10-13 NOTE — Progress Notes (Signed)
 Patient ID: Christian Bailey, male   DOB: [birth year], 83 y.o.   MRN: [id number] Vital signs are stable Patient is awake and alert States he feels he may be slightly better On my testing his motor strength in the right and left upper extremities is about the same his lower extremities remain substantially stiff and rigid and his motor strength feels about the same as it did preop unless his incision is clean and dry and appears to not have an excessive amount of pain.  Plan okay to mobilize as tolerated.  Rehab evaluation and transfer whenever the bed is available.

## 2023-10-13 NOTE — Progress Notes (Signed)
 Speech Language Pathology Treatment: Dysphagia  Patient Details Name: Christian Bailey MRN: [id number] DOB: 14-Oct-1940 Today's Date: 10/13/2023 Time: 5284-1324 SLP Time Calculation (min) (ACUTE ONLY): 15 min  Assessment / Plan / Recommendation Clinical Impression  SLP conducted skilled therapy session targeting dysphagia goals. Upon SLP entry, nursing in room providing medications. At first, administered whole with thin liquids per recommendations prior to neck surgery (10/12/23). Patient noted to take large/consecutive sips of thin liquids, as he would have prior to surgery without difficulty. This date, however, he exhibited loud, audible swallows, multiple swallows, several eructations, and immediate cough. Trialed next pill with smaller sip with same result. SLP recommended RN switch to providing medications whole in puree. Patient tolerated next 7 pills without any overt difficulty. After pill administration, SLP observed patient with thin liquids in absence of pill administration. In all trials, patient continued to exhibit above mentioned s/sx of suspected penetration/aspiration, likely d/t swelling from neck surgery as this is an acute change from swallowing function prior to procedure. Recommend MBS to objectively assess oropharyngeal swallowing function. Patient should remain NPO until results from MBS have been rendered, though SLP did clear wife and RN to provide patient with floor stock of puree-like textures until results from [facility] are rendered as well as SMALL sips of water and/or ice chips after oral care. Patient was left in room with call bell in reach and alarm set. SLP will continue to target goals per plan of care.     HPI HPI: Patient is an 83 yo male presenting to the ED status post fall on 10/02/23. Patient hit head in fall, with CT of head and spine clear. MRI finding edematous changes from C2-C5. Compression of the spinal cord to 5 mm at C5-6 and C6-7 secondary to disc disease and  ossification of posterior longitudinal ligament.  Moderate foraminal stenosis bilaterally at C6-7 and on the right at C7-T1.  Mild foraminal narrowing bilaterally at C2-3 and C3-4. Underwent C6 discectomy 10/13/23. PMH includes:  peripheral artery disease  on Plavix and Crestor, RBBB, chronic HFpEF, CKD 4, carotid stenosis, hypertension, hyperlipidemia, type 2 diabetes, chronic anxiety/depression, iron deficiency anemia, GERD.      SLP Plan  MBS      Recommendations for follow up therapy are one component of a multi-disciplinary discharge planning process, led by the attending physician.  Recommendations may be updated based on patient status, additional functional criteria and insurance authorization.    Recommendations  Diet recommendations: NPO Medication Administration: Whole meds with puree                Rehab consult Oral care BID   Frequent or constant Supervision/Assistance Dysphagia, unspecified (R13.10)     MBS    Dorla Gartner, M.A., CCC-SLP  [name] A [facility]  10/13/2023, 11:48 AM

## 2023-10-14 ENCOUNTER — Inpatient Hospital Stay (HOSPITAL_COMMUNITY)

## 2023-10-14 DIAGNOSIS — F05 Delirium due to known physiological condition: Secondary | ICD-10-CM | POA: Diagnosis not present

## 2023-10-14 LAB — GLUCOSE, CAPILLARY
Glucose-Capillary: 141 mg/dL — ABNORMAL HIGH (ref 70–99)
Glucose-Capillary: 133 mg/dL — ABNORMAL HIGH (ref 70–99)
Glucose-Capillary: 153 mg/dL — ABNORMAL HIGH (ref 70–99)
Glucose-Capillary: 166 mg/dL — ABNORMAL HIGH (ref 70–99)

## 2023-10-14 MED ORDER — SODIUM CHLORIDE 0.9 % IV SOLN: INTRAVENOUS | Status: DC

## 2023-10-14 MED ORDER — PREDNISONE 20 MG PO TABS
20.0000 mg | ORAL_TABLET | Freq: Two times a day (BID) | ORAL | Status: DC
  Administered 2023-10-14 – 2023-10-15 (×3): 20 mg via ORAL
  Filled 2023-10-14 (×3): qty 1

## 2023-10-14 NOTE — Progress Notes (Signed)
 Mobility Specialist Progress Note:   10/14/23 1600  Mobility  Activity Transferred from chair to bed;Transferred to/from [facility]  Level of Assistance Moderate assist, patient does 50-74% (+2)  Assistive Device Stedy  Activity Response Tolerated well  Mobility visit 1 Mobility  Mobility Specialist Start Time (ACUTE ONLY) 1540  Mobility Specialist Stop Time (ACUTE ONLY) 1555  Mobility Specialist Time Calculation (min) (ACUTE ONLY) 15 min   Pt received in chair, requesting assistance back to bed. Pt grimacing when reaching towards stedy d/t L shoulder pain. ModA +2 to stand in stedy. During transfer pt requesting to use BSC for BM. Void successful. MS performed pericare. Pt transferred back to bed with call bell in reach and all needs met. Family present.   Sofia Dunn  Mobility Specialist Please contact via [employer] office at [phone]

## 2023-10-14 NOTE — Progress Notes (Signed)
 PT Cancellation Note  Patient Details Name: Christian Bailey MRN: [id number] DOB: July 26, 1940   Cancelled Treatment:    Reason Eval/Treat Not Completed: (P) Other (comment) (Scheduled for MBS at 1145 am this morning, will defer so he can be moved to chair today.)   Ashley Bultema J Karima Carrell 10/14/2023, 11:15 AM  Nichoals Heyde R. , PTA Acute Rehabilitation Services Office [phone]

## 2023-10-14 NOTE — Progress Notes (Signed)
 Physical Therapy Treatment Patient Details Name: Christian Bailey MRN: [id number] DOB: June 05, 1941 Today's Date: 10/14/2023   History of Present Illness Patient is an 83 yo male presenting to the ED status post fall on 10/02/23. MRI finding edematous changes from C2-C5. Compression of the spinal cord at C5-6 and C6-7. Moderate foraminal stenosis bilaterally at C6-7 and on the right at C7-T1.  Mild foraminal narrowing bilaterally at C2-3 and C3-4. ACDF performed 4/15. PMH includes:  peripheral artery disease on Plavix and Crestor, RBBB, chronic HFpEF, CKD 4, carotid stenosis, hypertension, hyperlipidemia, type 2 diabetes, chronic anxiety/depression, iron deficiency anemia, GERDsecondary to disc disease and ossification of posterior longitudinal ligament.  Moderate foraminal stenosis bilaterally at C6-7 and on the right at C7-T1.  Mild foraminal narrowing bilaterally at C2-3 and C3-4; AIR following for likely post-acute rehab; Neurosurgery consulted, considering cervical decompression surgery;  PMH includes:  peripheral artery disease on Plavix and Crestor, RBBB, chronic HFpEF, CKD 4, carotid stenosis, hypertension, hyperlipidemia, type 2 diabetes, chronic anxiety/depression, iron deficiency anemia, GERD    PT Comments  Pt supine in bed.  He is agreeable to PT session this pm.  He continues to require assistance for all aspects of mobility.  He remains weak and adducted in B hips during transfers.  Poor ability to reach cross bar on sara stedy in sitting with RUE.  Pt presents with R lean as RLE appears weaker.  Continue to recommend rehab in a post acute setting to maximize functional gains before returning home.    If plan is discharge home, recommend the following: Two people to help with walking and/or transfers;Two people to help with bathing/dressing/bathroom;Help with stairs or ramp for entrance;Assist for transportation;Assistance with cooking/housework   Can travel by private vehicle     No  Equipment  Recommendations  Wheelchair (measurements PT);Wheelchair cushion (measurements PT);Hospital bed    Recommendations for Other Services       Precautions / Restrictions Precautions Precautions: Fall;Cervical Precaution Booklet Issued: No Recall of Precautions/Restrictions: Intact Precaution/Restrictions Comments: very confused Required Braces or Orthoses: Cervical Brace Cervical Brace: Soft collar Restrictions Weight Bearing Restrictions Per Provider Order: No     Mobility  Bed Mobility Overal bed mobility: Needs Assistance Bed Mobility: Rolling, Sidelying to Sit Rolling: Max assist Sidelying to sit: Max assist, Total assist, Used rails       General bed mobility comments: Limited ability to roll due to weakness and decreased ROM.  Pt continues to require max assistance for rolling.    Transfers Overall transfer level: Needs assistance Equipment used: Rolling walker (2 wheels), Ambulation equipment used Transfers: Bed to chair/wheelchair/BSC, Sit to/from Stand Sit to Stand: Max assist Stand pivot transfers: Total assist, +2 physical assistance (in sara stedy)         General transfer comment: Pt performed transfer in stedy from bed with limited hip extension and forward flexion.  Pt required increased facilitation to achieve hip extension and upright trunk but able to maintain around 10 sec.  Presents with poor eccentric load from stand to sit.    Ambulation/Gait                   Stairs             Wheelchair Mobility     Tilt Bed    Modified Rankin (Stroke Patients Only)       Balance Overall balance assessment: Needs assistance Sitting-balance support: Bilateral upper extremity supported, Feet supported Sitting balance-Leahy Scale: Poor Sitting balance - Comments: heavy posterior  bias requiring mod assist to correct, sitting tolerance x2 minutes Postural control: Posterior lean Standing balance support: Bilateral upper extremity  supported, During functional activity Standing balance-Leahy Scale: Zero Standing balance comment: stood in [place] with max assistance                            Communication Communication Communication: No apparent difficulties Factors Affecting Communication: Hearing impaired  Cognition Arousal: Alert Behavior During Therapy: WFL for tasks assessed/performed   PT - Cognitive impairments: Awareness, Problem solving, Safety/Judgement, Orientation, Attention                       PT - Cognition Comments: pt hallucinating throughout session, oriented to self, location, and time but states he is here because "I am going to be executed" and "my wife and I were involved in a bank robbery". Pt states there is smoke in the room, is talking to people who aren't present Following commands: Intact Following commands impaired: Follows one step commands inconsistently, Follows one step commands with increased time    Cueing Cueing Techniques: Verbal cues, Tactile cues  Exercises      General Comments        Pertinent Vitals/Pain Pain Assessment Pain Assessment: Faces Faces Pain Scale: Hurts little more Pain Location: neck Pain Descriptors / Indicators: Grimacing, Guarding Pain Intervention(s): Monitored during session, Repositioned    Home Living                          Prior Function            PT Goals (current goals can now be found in the care plan section) Acute Rehab PT Goals Potential to Achieve Goals: Good Progress towards PT goals: Progressing toward goals    Frequency    Min 2X/week      PT Plan      Co-evaluation              AM-PAC PT "6 Clicks" Mobility   Outcome Measure  Help needed turning from your back to your side while in a flat bed without using bedrails?: Total Help needed moving from lying on your back to sitting on the side of a flat bed without using bedrails?: Total Help needed moving to and from a bed  to a chair (including a wheelchair)?: Total Help needed standing up from a chair using your arms (e.g., wheelchair or bedside chair)?: Total Help needed to walk in hospital room?: Total Help needed climbing 3-5 steps with a railing? : Total 6 Click Score: 6    End of Session Equipment Utilized During Treatment: Cervical collar Activity Tolerance: Patient tolerated treatment well Patient left: in bed;with call bell/phone within reach;with bed alarm set;with nursing/sitter in room Nurse Communication: Mobility status PT Visit Diagnosis: Other abnormalities of gait and mobility (R26.89);Muscle weakness (generalized) (M62.81);Repeated falls (R29.6);Pain     Time: 1308-6578 PT Time Calculation (min) (ACUTE ONLY): 18 min  Charges:    $Therapeutic Activity: 8-22 mins PT General Charges $$ ACUTE PT VISIT: 1 Visit                     Bonney Leitz , PTA Acute Rehabilitation Services Office [phone]    Florestine Avers 10/14/2023, 3:41 PM

## 2023-10-14 NOTE — Plan of Care (Signed)
  Problem: Education: Goal: Ability to describe self-care measures that may prevent or decrease complications (Diabetes Survival Skills Education) will improve Outcome: Progressing Goal: Individualized Educational Video(s) Outcome: Progressing   Problem: Coping: Goal: Ability to adjust to condition or change in health will improve Outcome: Progressing   Problem: Fluid Volume: Goal: Ability to maintain a balanced intake and output will improve Outcome: Progressing   Problem: Health Behavior/Discharge Planning: Goal: Ability to identify and utilize available resources and services will improve Outcome: Progressing Goal: Ability to manage health-related needs will improve Outcome: Progressing   Problem: Metabolic: Goal: Ability to maintain appropriate glucose levels will improve Outcome: Progressing   Problem: Nutritional: Goal: Maintenance of adequate nutrition will improve Outcome: Progressing Goal: Progress toward achieving an optimal weight will improve Outcome: Progressing   Problem: Skin Integrity: Goal: Risk for impaired skin integrity will decrease Outcome: Progressing   Problem: Tissue Perfusion: Goal: Adequacy of tissue perfusion will improve Outcome: Progressing   Problem: Education: Goal: Knowledge of General Education information will improve Description: Including pain rating scale, medication(s)/side effects and non-pharmacologic comfort measures Outcome: Progressing   Problem: Health Behavior/Discharge Planning: Goal: Ability to manage health-related needs will improve Outcome: Progressing   Problem: Clinical Measurements: Goal: Ability to maintain clinical measurements within normal limits will improve Outcome: Progressing Goal: Will remain free from infection Outcome: Progressing Goal: Diagnostic test results will improve Outcome: Progressing Goal: Respiratory complications will improve Outcome: Progressing Goal: Cardiovascular complication will  be avoided Outcome: Progressing   Problem: Activity: Goal: Risk for activity intolerance will decrease Outcome: Progressing   Problem: Nutrition: Goal: Adequate nutrition will be maintained Outcome: Progressing   Problem: Coping: Goal: Level of anxiety will decrease Outcome: Progressing   Problem: Elimination: Goal: Will not experience complications related to bowel motility Outcome: Progressing Goal: Will not experience complications related to urinary retention Outcome: Progressing   Problem: Pain Managment: Goal: General experience of comfort will improve and/or be controlled Outcome: Progressing   Problem: Safety: Goal: Ability to remain free from injury will improve Outcome: Progressing   Problem: Skin Integrity: Goal: Risk for impaired skin integrity will decrease Outcome: Progressing   Problem: Education: Goal: Ability to verbalize activity precautions or restrictions will improve Outcome: Progressing Goal: Knowledge of the prescribed therapeutic regimen will improve Outcome: Progressing Goal: Understanding of discharge needs will improve Outcome: Progressing   Problem: Activity: Goal: Ability to avoid complications of mobility impairment will improve Outcome: Progressing Goal: Ability to tolerate increased activity will improve Outcome: Progressing Goal: Will remain free from falls Outcome: Progressing   Problem: Bowel/Gastric: Goal: Gastrointestinal status for postoperative course will improve Outcome: Progressing   Problem: Clinical Measurements: Goal: Ability to maintain clinical measurements within normal limits will improve Outcome: Progressing Goal: Postoperative complications will be avoided or minimized Outcome: Progressing Goal: Diagnostic test results will improve Outcome: Progressing   Problem: Pain Management: Goal: Pain level will decrease Outcome: Progressing

## 2023-10-14 NOTE — Progress Notes (Signed)
 Modified Barium Swallow Study  Patient Details  Name: Christian Bailey MRN: [id number] Date of Birth: 1940/11/20  Today's Date: 10/14/2023  Modified Barium Swallow completed.  Full report located under Chart Review in the Imaging Section.  History of Present Illness Patient is an 83 yo male presenting to the ED status post fall on 10/02/23. Patient hit head in fall, with CT of head and spine clear. MRI finding edematous changes from C2-C5. 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  Moderate foraminal stenosis bilaterally at C6-7 and on the right at C7-T1.  Mild foraminal narrowing bilaterally at C2-3 and C3-4. Underwent C6 discectomy 10/13/23. PMH includes:  peripheral artery disease  on Plavix and Crestor, RBBB, chronic HFpEF, CKD 4, carotid stenosis, hypertension, hyperlipidemia, type 2 diabetes, chronic anxiety/depression, iron deficiency anemia, GERD.   Clinical Impression Pt demonstrates an acute, severe dysphagia with a DIGEST score of 3 due to edema on posterior pharyngeal wall/pharyngeal esophageal segment following ACDF procedure. Pharyngeal wall edema also restricts hyoid excursion against an anchored PES and limits epiglottic deflection. This leads to pharyngeal residual of thins, nectar and puree and with liquids, consistent aspiration during multiple swallow attempts to clear.  Pt sensed aspiration and had severe coughing and shortness of breath after only a few sips. Though puree is not aspirated, more than 50% of residue remains in valleculae without ability to clear. Likely pills are not being swallowed immediately if given orally. Recommend NPO except for ice,  with close f/u for subjective improvements. Factors that may increase risk of adverse event in presence of aspiration Christian Bailey & Christian Bailey 2021): Frequent aspiration of large volumes  DIGEST Swallow Severity  Rating*  Safety:   Efficiency:  Overall Pharyngeal Swallow Severity:  1: mild; 2: moderate; 3: severe; 4: profound  *The Dynamic Imaging Grade of Swallowing Toxicity is standardized for the head and neck cancer population, however, demonstrates promising clinical applications across populations to standardize the clinical rating of pharyngeal swallow safety and severity.  Swallow Evaluation Recommendations Recommendations: NPO;Ice chips PRN after oral care Medication Administration: Via alternative means      Christian Bailey, Christian Bailey 10/14/2023,1:51 PM

## 2023-10-14 NOTE — Progress Notes (Signed)
 Patient ID: Christian Bailey, male   DOB: Jun 17, 1941, 83 y.o.   MRN: [id number] Neurologically doing ok. Some swallowing difficulties. Ok to add prednisone 20 bid for a few days and then taper.I will see him on Saturday.

## 2023-10-14 NOTE — Progress Notes (Signed)
 PROGRESS NOTE    Christian Bailey  ZOX:[id number] DOB: 10/06/1940 DOA: 10/02/2023 PCP: Chilton Greathouse, MD    Brief Narrative:  83 year old with history of peripheral artery disease on Plavix and Crestor, right bundle branch block, chronic diastolic heart failure, CKD stage IV, ambulatory dysfunction, hypertension,  hyperlipidemia,  type 2 diabetes, chronic anxiety and depression and GERD presented to the emergency room with a fall.  Patient reportedly with worsening mobility since last 6 months.  In the emergency room hemodynamically stable.  Imaging consistent with posterior spinous ligament injury from C2-C5.  Difficult to tolerate pain medications and getting delirious with narcotics.  Followed by neurosurgery.  Also with leukocytosis. Patient developed severe agitation and hyper active delirium. 4/15, ultimately mental status improved so he was taken to operating room for cervical decompression.  Subjective:  Patient seen and examined.  He is snoring after taking Flexeril in the morning.  No other overnight events.  Nursing reported no episodes of agitation or delirium anymore.  No family at the bedside. His arms and legs are less spastic.   Assessment & Plan:   Acute metabolic encephalopathy likely secondary to polypharmacy, hospital-acquired delirium and narcotics: Hyperactive delirium secondary to multiple factors including hospital-acquired delirium.  History of delirium in 2022. Cervical myelopathy with spasticity and compression symptoms.  Mental status waxes and wanes.  Had developed severe delirium and hallucination.  Responded to haloperidol and Seroquel. 4/14-4/16, mental status has improved.  So far has remained stable. Tylenol 1 g every 6 hours around-the-clock.  Baclofen tolerated well. Followed by neurosurgery.  With ongoing compressive symptoms, underwent cervical spine decompression and stabilization. Judicious use of narcotics. Usage haloperidol and or Seroquel for  delirium.  Prefer IV haloperidol. Start mobilizing with PT OT and referred to CIR.  Acute intractable pain, peripheral arterial disease: Improving. Continue Cymbalta, Tylenol 1 g every 6 hours, Lyrica 50 mg twice daily, Flexeril 5 mg 3 times daily.  Bowel regimen.   CKD stage IV: At about baseline.  Anxiety depression: On Cymbalta.  Leukocytosis: Patient presented with WBC count of 19,000.  Urine was clear.  No other clear source of infection.  Treated with Rocephin and azithromycin. WBC already trending down.   Procalcitonin 5.5.  WC count trending down. Cultures negative so far.  Normalized.  Completed antibiotics.  Essential hypertension: Blood pressure stable.  Holding lisinopril.  On Toprol-XL.  Type 2 diabetes: Known A1c 8.  On sliding scale.  Acute urinary retention: With altered mentation, he had to have multiple In-N-Out cath.  Currently has a Foley catheter.  Will remove once he is more mobile.   Goal of care: Patient had gone through severe episodes of delirium.  Improving now. Remains DNR. Hopefully he will stabilize enough after the surgery and go to CIR.    DVT prophylaxis: SCD's Start: 10/12/23 1208 SCDs Start: 10/02/23 2244   Code Status: DNR with limited intervention. Family Communication: None today. Disposition Plan: Status is: Inpatient PatientRemains inpatient appropriate because: Immediate postop.  Stable to transfer to CIR when bed available.     Consultants:  Neurosurgery Palliative care Psychiatry.  Procedures:  None  Antimicrobials:  Rocephin azithromycin 4/8--- 4/11     Objective: Vitals:   10/13/23 1947 10/14/23 0004 10/14/23 0503 10/14/23 0946  BP: (!) 117/59 (!) 105/51 (!) 134/58 (!) 137/55  Pulse: 89 87 92 94  Resp: 17 17 17 17   Temp: 98.6 F (37 C) 98.5 F (36.9 C) 97.9 F (36.6 C) (!) 97.5 F (36.4 C)  TempSrc: Oral  Oral Axillary  SpO2: 97% 98% 97% 99%  Weight:      Height:        Intake/Output Summary (Last 24  hours) at 10/14/2023 1117 Last data filed at 10/14/2023 0600 Gross per 24 hour  Intake --  Output 1400 ml  Net -1400 ml   Filed Weights   10/12/23 0325 10/12/23 0638 10/13/23 0545  Weight: 63.7 kg 65.3 kg 65.3 kg    Examination: In morning rounds  General: Comfortably sleepy and snoring. Neck incisions clean and dry. Dressings intact Cardiovascular: S1-S2 normal regular rate rhythm. Respiratory: Bilateral clear.  On room air. Gastrointestinal: Soft and nontender.  Bowel sound present. Ext: No swelling edema. Patient has generalized weakness of all extremities.  He is able to better use his legs and arms with less spasticity.    Data Reviewed: I have personally reviewed following labs and imaging studies  CBC: Recent Labs  Lab 10/08/23 0400 10/09/23 0439  WBC 13.2* 10.4  HGB 10.5* 9.5*  HCT 32.0* 28.2*  MCV 88.9 86.5  PLT 319 322   Basic Metabolic Panel: Recent Labs  Lab 10/08/23 0400 10/09/23 0439  NA 138 137  K 4.3 3.6  CL 107 104  CO2 17* 20*  GLUCOSE 147* 124*  BUN 51* 55*  CREATININE 2.36* 2.26*  CALCIUM 8.9 8.8*   GFR: Estimated Creatinine Clearance: 21.9 mL/min (A) (by C-G formula based on SCr of 2.26 mg/dL (H)). Liver Function Tests: Recent Labs  Lab 10/08/23 0400 10/09/23 0439  AST 29 34  ALT 20 23  ALKPHOS 69 62  BILITOT 1.0 0.7  PROT 6.4* 6.1*  ALBUMIN 2.6* 2.5*   No results for input(s): "LIPASE", "AMYLASE" in the last 168 hours. No results for input(s): "AMMONIA" in the last 168 hours. Coagulation Profile: No results for input(s): "INR", "PROTIME" in the last 168 hours. Cardiac Enzymes: No results for input(s): "CKTOTAL", "CKMB", "CKMBINDEX", "TROPONINI" in the last 168 hours. BNP (last 3 results) No results for input(s): "PROBNP" in the last 8760 hours. HbA1C: No results for input(s): "HGBA1C" in the last 72 hours. CBG: Recent Labs  Lab 10/13/23 0616 10/13/23 1207 10/13/23 1558 10/13/23 2122 10/14/23 0642  GLUCAP 148* 138*  209* 146* 141*   Lipid Profile: No results for input(s): "CHOL", "HDL", "LDLCALC", "TRIG", "CHOLHDL", "LDLDIRECT" in the last 72 hours. Thyroid Function Tests: No results for input(s): "TSH", "T4TOTAL", "FREET4", "T3FREE", "THYROIDAB" in the last 72 hours. Anemia Panel: No results for input(s): "VITAMINB12", "FOLATE", "FERRITIN", "TIBC", "IRON", "RETICCTPCT" in the last 72 hours. Sepsis Labs: No results for input(s): "PROCALCITON", "LATICACIDVEN" in the last 168 hours.   Recent Results (from the past 240 hours)  Culture, blood (Routine X 2) w Reflex to ID Panel     Status: None   Collection Time: 10/05/23  2:30 PM   Specimen: BLOOD  Result Value Ref Range Status   Specimen Description BLOOD BLOOD LEFT HAND  Final   Special Requests   Final    BOTTLES DRAWN AEROBIC AND ANAEROBIC Blood Culture adequate volume   Culture   Final    NO GROWTH 5 DAYS Performed at [facility], [address], [place], [place] [address]    Report Status 10/10/2023 FINAL  Final  Culture, blood (Routine X 2) w Reflex to ID Panel     Status: None   Collection Time: 10/05/23  2:30 PM   Specimen: BLOOD LEFT HAND  Result Value Ref Range Status   Specimen Description BLOOD LEFT HAND  Final   Special Requests   Final    BOTTLES DRAWN AEROBIC AND ANAEROBIC Blood Culture adequate volume   Culture   Final    NO GROWTH 5 DAYS Performed at [facility], [address], [place], [place] [address]    Report Status 10/10/2023 FINAL  Final  Surgical pcr screen     Status: None   Collection Time: 10/12/23  3:24 AM   Specimen: Nasal Mucosa; Nasal Swab  Result Value Ref Range Status   MRSA, PCR NEGATIVE NEGATIVE Final   Staphylococcus aureus NEGATIVE NEGATIVE Final    Comment: (NOTE) The Xpert SA Assay (FDA approved for NASAL specimens in patients 44 years of age and older), is one component of a comprehensive surveillance program. It is not intended to diagnose infection nor to guide or monitor  treatment. Performed at [facility], [address] [place], [place] [address]          Radiology Studies: No results found.        Scheduled Meds:  acetaminophen  1,000 mg Oral Q6H   Chlorhexidine Gluconate Cloth  6 each Topical Daily   docusate sodium  100 mg Oral BID   DULoxetine  20 mg Oral Daily   feeding supplement  237 mL Oral BID BM   ferrous sulfate  325 mg Oral Q breakfast   insulin aspart  0-5 Units Subcutaneous QHS   insulin aspart  0-9 Units Subcutaneous TID WC   metoprolol succinate  50 mg Oral Daily   mirabegron ER  50 mg Oral Daily   pantoprazole  40 mg Oral BID   pregabalin  50 mg Oral BID   rosuvastatin  20 mg Oral QPM   senna  1 tablet Oral BID   sodium chloride flush  3 mL Intravenous Q12H   Continuous Infusions:      LOS: 11 days    Time spent: 40 minutes    Vada Garibaldi, MD [facility]

## 2023-10-15 DIAGNOSIS — F05 Delirium due to known physiological condition: Secondary | ICD-10-CM | POA: Diagnosis not present

## 2023-10-15 LAB — GLUCOSE, CAPILLARY
Glucose-Capillary: 171 mg/dL — ABNORMAL HIGH (ref 70–99)
Glucose-Capillary: 196 mg/dL — ABNORMAL HIGH (ref 70–99)
Glucose-Capillary: 293 mg/dL — ABNORMAL HIGH (ref 70–99)
Glucose-Capillary: 223 mg/dL — ABNORMAL HIGH (ref 70–99)

## 2023-10-15 MED ORDER — PREDNISONE 10 MG PO TABS: 10.0000 mg | ORAL_TABLET | Freq: Every day | ORAL | Status: DC

## 2023-10-15 MED ORDER — PREDNISONE 10 MG PO TABS
10.0000 mg | ORAL_TABLET | Freq: Two times a day (BID) | ORAL | Status: DC
  Administered 2023-10-17 – 2023-10-18 (×2): 10 mg via ORAL
  Filled 2023-10-15 (×2): qty 1

## 2023-10-15 MED ORDER — PREDNISONE 20 MG PO TABS
20.0000 mg | ORAL_TABLET | Freq: Two times a day (BID) | ORAL | Status: AC
  Administered 2023-10-15 – 2023-10-17 (×4): 20 mg via ORAL
  Filled 2023-10-15 (×4): qty 1

## 2023-10-15 NOTE — Anesthesia Postprocedure Evaluation (Signed)
 Anesthesia Post Note  Patient: Christian Bailey  Procedure(s) Performed: ANTERIOR CERVICAL DECOMPRESSION VIA CORPECTOMY WITH RECONSTRUCTION INCLUDING ANTERIOR CERVICAL PLATING CERVICAL FIVE-SIX,CERVICAL SIX-SEVEN     Patient location during evaluation: PACU Anesthesia Type: General Level of consciousness: awake and patient cooperative Pain management: pain level controlled Vital Signs Assessment: post-procedure vital signs reviewed and stable Respiratory status: spontaneous breathing, nonlabored ventilation and respiratory function stable Cardiovascular status: blood pressure returned to baseline and stable Postop Assessment: no apparent nausea or vomiting Anesthetic complications: no   There were no known notable events for this encounter.                 Kyan Giannone

## 2023-10-15 NOTE — Progress Notes (Signed)
 PROGRESS NOTE    Christian Bailey  ZOX:[id number] DOB: [birth year] DOA: 10/02/2023 PCP: Lonzie Robins, MD    Brief Narrative:  83 year old with history of peripheral artery disease on Plavix  and Crestor , right bundle branch block, chronic diastolic heart failure, CKD stage IV, ambulatory dysfunction, hypertension,  hyperlipidemia,  type 2 diabetes, chronic anxiety and depression and GERD presented to the emergency room with a fall.  Patient reportedly with worsening mobility since last 6 months.  In the emergency room hemodynamically stable.  Imaging consistent with posterior spinous ligament injury from C2-C5.  Difficult to tolerate pain medications and getting delirious with narcotics.  Followed by neurosurgery.  Also with leukocytosis. Patient developed severe agitation and hyper active delirium. 4/15, ultimately mental status improved so he was taken to operating room for cervical decompression. 4/18, patient clinically improving.  Waiting to go to CIR.  Subjective:  Patient seen and examined.  He is full of sense of humor today.  He was able to tell me doctors office jokes.  He tells me that he always have some swallowing difficulties and this is nothing new for him.  He will take it easy. He thinks he can void without catheter.  Assessment & Plan:   Acute metabolic encephalopathy likely secondary to polypharmacy, hospital-acquired delirium and narcotics: Hyperactive delirium secondary to multiple factors including hospital-acquired delirium.  History of delirium in 2022.  Had developed severe delirium and hallucination.  Responded to haloperidol  and Seroquel . 4/14-4/16, mental status has improved.  So far has remained stable. Tylenol  1 g every 6 hours around-the-clock.  Baclofen  tolerated well.  Trauma, cervical myelopathy: Followed by neurosurgery.  With ongoing compressive symptoms, underwent cervical spine decompression and stabilization. Judicious use of narcotics. Start  mobilizing with PT OT and referred to CIR. Clinically improved.  Acute intractable pain, peripheral arterial disease: Improving. Continue Cymbalta , Tylenol  1 g every 6 hours, Lyrica  50 mg twice daily, Flexeril  5 mg 3 times daily.  Bowel regimen.   CKD stage IV: At about baseline.  Anxiety depression: On Cymbalta .  Leukocytosis: Patient presented with WBC count of 19,000.  Urine was clear.  No other clear source of infection.  Treated with Rocephin  and azithromycin . WBC already trending down.   Procalcitonin 5.5.  WC count trending down. Cultures negative so far.  Normalized.  Completed antibiotics.  Essential hypertension: Blood pressure stable.  Holding lisinopril .  On Toprol -XL.  Type 2 diabetes: Known A1c 8.  On sliding scale.  Acute urinary retention: With altered mentation, he had to have multiple In-N-Out cath.  Currently has a Foley catheter.  Voiding trial today.  Dysphagia: Chronic dysphagia and along with retropharyngeal edema from cervical spine surgery.  Improving.  As discussed with neurosurgery, prednisone  taper prescribed to help with edema. Needs aspiration precautions.  Goal of care: Patient had gone through severe episodes of delirium.  Improving now. Remains DNR. Ready to go to CIR.    DVT prophylaxis: SCD's Start: 10/12/23 1208 SCDs Start: 10/02/23 2244   Code Status: DNR with limited intervention. Family Communication: None today. Disposition Plan: Status is: Inpatient PatientRemains inpatient appropriate because: Stable to transfer to CIR when bed available.     Consultants:  Neurosurgery Palliative care Psychiatry.  Procedures:  None  Antimicrobials:  Rocephin  azithromycin  4/8--- 4/11     Objective: Vitals:   10/14/23 1805 10/14/23 2048 10/15/23 0446 10/15/23 0714  BP: (!) 123/53 (!) 141/67 (!) 132/59 128/72  Pulse: 92 97 87   Resp: 17 14 15 17   Temp: (!) 97.4 F (  36.3 C) (!) 97.5 F (36.4 C) 98.5 F (36.9 C) (!) 97.4 F (36.3 C)   TempSrc: Oral Oral  Oral  SpO2: 98% 99% 98% 100%  Weight:      Height:        Intake/Output Summary (Last 24 hours) at 10/15/2023 1108 Last data filed at 10/15/2023 0900 Gross per 24 hour  Intake 283.04 ml  Output 1350 ml  Net -1066.96 ml   Filed Weights   10/12/23 0325 10/12/23 0638 10/13/23 0545  Weight: 63.7 kg 65.3 kg 65.3 kg    Examination: In morning rounds  General: Frail debilitated.  Chronically sick looking.  Comfortable today.  On room air. Neck incisions clean and dry. Dressings intact Cardiovascular: S1-S2 normal regular rate rhythm. Respiratory: Bilateral clear.  On room air. Gastrointestinal: Soft and nontender.  Bowel sound present.  Foley catheter with clear urine. Ext: No swelling edema. Patient has generalized weakness of all extremities.  He is able to better use his legs and arms with less spasticity. Alert awake and oriented.  Able to tell jokes.    Data Reviewed: I have personally reviewed following labs and imaging studies  CBC: Recent Labs  Lab 10/09/23 0439  WBC 10.4  HGB 9.5*  HCT 28.2*  MCV 86.5  PLT 322   Basic Metabolic Panel: Recent Labs  Lab 10/09/23 0439  NA 137  K 3.6  CL 104  CO2 20*  GLUCOSE 124*  BUN 55*  CREATININE 2.26*  CALCIUM  8.8*   GFR: Estimated Creatinine Clearance: 21.9 mL/min (A) (by C-G formula based on SCr of 2.26 mg/dL (H)). Liver Function Tests: Recent Labs  Lab 10/09/23 0439  AST 34  ALT 23  ALKPHOS 62  BILITOT 0.7  PROT 6.1*  ALBUMIN  2.5*   No results for input(s): "LIPASE", "AMYLASE" in the last 168 hours. No results for input(s): "AMMONIA" in the last 168 hours. Coagulation Profile: No results for input(s): "INR", "PROTIME" in the last 168 hours. Cardiac Enzymes: No results for input(s): "CKTOTAL", "CKMB", "CKMBINDEX", "TROPONINI" in the last 168 hours. BNP (last 3 results) No results for input(s): "PROBNP" in the last 8760 hours. HbA1C: No results for input(s): "HGBA1C" in the last 72  hours. CBG: Recent Labs  Lab 10/14/23 0642 10/14/23 1229 10/14/23 1802 10/14/23 2209 10/15/23 0726  GLUCAP 141* 133* 153* 166* 171*   Lipid Profile: No results for input(s): "CHOL", "HDL", "LDLCALC", "TRIG", "CHOLHDL", "LDLDIRECT" in the last 72 hours. Thyroid  Function Tests: No results for input(s): "TSH", "T4TOTAL", "FREET4", "T3FREE", "THYROIDAB" in the last 72 hours. Anemia Panel: No results for input(s): "VITAMINB12", "FOLATE", "FERRITIN", "TIBC", "IRON", "RETICCTPCT" in the last 72 hours. Sepsis Labs: No results for input(s): "PROCALCITON", "LATICACIDVEN" in the last 168 hours.   Recent Results (from the past 240 hours)  Culture, blood (Routine X 2) w Reflex to ID Panel     Status: None   Collection Time: 10/05/23  2:30 PM   Specimen: BLOOD  Result Value Ref Range Status   Specimen Description BLOOD BLOOD LEFT HAND  Final   Special Requests   Final    BOTTLES DRAWN AEROBIC AND ANAEROBIC Blood Culture adequate volume   Culture   Final    NO GROWTH 5 DAYS Performed at [facility], [address], [place], [place] [address]    Report Status 10/10/2023 FINAL  Final  Culture, blood (Routine X 2) w Reflex to ID Panel     Status: None   Collection Time: 10/05/23  2:30 PM  Specimen: BLOOD LEFT HAND  Result Value Ref Range Status   Specimen Description BLOOD LEFT HAND  Final   Special Requests   Final    BOTTLES DRAWN AEROBIC AND ANAEROBIC Blood Culture adequate volume   Culture   Final    NO GROWTH 5 DAYS Performed at [facility], [address], [place], [place] [address]    Report Status 10/10/2023 FINAL  Final  Surgical pcr screen     Status: None   Collection Time: 10/12/23  3:24 AM   Specimen: Nasal Mucosa; Nasal Swab  Result Value Ref Range Status   MRSA, PCR NEGATIVE NEGATIVE Final   Staphylococcus aureus NEGATIVE NEGATIVE Final    Comment: (NOTE) The Xpert SA Assay (FDA approved for NASAL specimens in patients 43 years of age and older), is  one component of a comprehensive surveillance program. It is not intended to diagnose infection nor to guide or monitor treatment. Performed at [facility], [address], [place], [place] [address]          Radiology Studies: DG Swallowing Func-Speech Pathology Result Date: 10/14/2023 Table formatting from the original result was not included. Modified Barium Swallow Study Patient Details Name: Wyeth Hoffer MRN: [id number] Date of Birth: 04-Apr-1941 Today's Date: 10/14/2023 HPI/PMH: HPI: Patient is an 83 yo male presenting to the ED status post fall on 10/02/23. Patient hit head in fall, with CT of head and spine clear. MRI finding edematous changes from C2-C5. 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  Moderate foraminal stenosis bilaterally at C6-7 and on the right at C7-T1.  Mild foraminal narrowing bilaterally at C2-3 and C3-4. Underwent C6 discectomy 10/13/23. PMH includes:  peripheral artery disease  on Plavix  and Crestor , RBBB, chronic HFpEF, CKD 4, carotid stenosis, hypertension, hyperlipidemia, type 2 diabetes, chronic anxiety/depression, iron deficiency anemia, GERD. Clinical Impression: Pt demonstrates an acute, severe dysphagia with a DIGEST score of 3 due to edema on posterior pharyngeal wall/pharyngeal esophageal segment following ACDF procedure. Pharyngeal wall edema also restricts hyoid excursion against an anchored PES and limits epiglottic deflection. This leads to pharyngeal residual of thins, nectar and puree and with liquids, consistent aspiration during multiple swallow attempts to clear.  Pt sensed aspiration and had severe coughing and shortness of breath after only a few sips. Though puree is not aspirated, more than 50% of residue remains in valleculae without ability to clear. Likely pills are not being swallowed immediately if given orally. Recommend NPO except for ice,  with close  f/u for subjective improvements. Factors that may increase risk of adverse event in presence of aspiration [name] & [name] 2021): Frequent aspiration of large volumes DIGEST Swallow Severity Rating*  Safety:  Efficiency:  Overall Pharyngeal Swallow Severity: 1: mild; 2: moderate; 3: severe; 4: profound *The Dynamic Imaging Grade of Swallowing Toxicity is standardized for the head and neck cancer population, however, demonstrates promising clinical applications across populations to standardize the clinical rating of pharyngeal swallow safety and severity. Recommendations/Plan: Swallowing Evaluation Recommendations Swallowing Evaluation Recommendations Recommendations: NPO; Ice chips PRN after oral care Medication Administration: Via alternative means Treatment Plan Treatment Plan Treatment recommendations: Therapy as outlined in treatment plan below Functional status assessment: Patient has had a recent decline in their functional status and demonstrates the ability to make significant improvements in function in a reasonable and predictable amount of time. Treatment frequency: Min 2x/week Treatment duration: 2 weeks Interventions:  Aspiration precaution training; Patient/family education; Trials of upgraded texture/liquids Recommendations Recommendations for follow up therapy are one component of a multi-disciplinary discharge planning process, led by the attending physician.  Recommendations may be updated based on patient status, additional functional criteria and insurance authorization. Assessment: Orofacial Exam: Orofacial Exam Oral Cavity: Oral Hygiene: WFL Oral Cavity - Dentition: Adequate natural dentition Anatomy: Anatomy: Other (Comment) (edema at C5/6) Boluses Administered: Boluses Administered Boluses Administered: Thin liquids (Level 0); Mildly thick liquids (Level 2, nectar thick); Puree  Oral Impairment Domain: Oral Impairment Domain Lip Closure: No labial escape Tongue control during bolus hold:  Cohesive bolus between tongue to palatal seal Bolus transport/lingual motion: Brisk tongue motion Oral residue: Complete oral clearance Location of oral residue : N/A Initiation of pharyngeal swallow : Posterior angle of the ramus  Pharyngeal Impairment Domain: Pharyngeal Impairment Domain Soft palate elevation: Escape to nasopharynx Laryngeal elevation: Partial superior movement of thyroid  cartilage/partial approximation of arytenoids to epiglottic petiole Anterior hyoid excursion: Partial anterior movement Epiglottic movement: Partial inversion Laryngeal vestibule closure: Incomplete, narrow column air/contrast in laryngeal vestibule Pharyngeal stripping wave : Present - diminished Pharyngoesophageal segment opening: Minimal distention/minimal duration, marked obstruction of flow Tongue base retraction: Narrow column of contrast or air between tongue base and PPW Pharyngeal residue: Majority of contrast within or on pharyngeal structures Location of pharyngeal residue: Valleculae; Pyriform sinuses  Esophageal Impairment Domain: No data recorded Pill: No data recorded Penetration/Aspiration Scale Score: Penetration/Aspiration Scale Score 1.  Material does not enter airway: Puree 7.  Material enters airway, passes BELOW cords and not ejected out despite cough attempt by patient: Mildly thick liquids (Level 2, nectar thick); Thin liquids (Level 0) Compensatory Strategies: Compensatory Strategies Compensatory strategies: Yes Effortful swallow: Ineffective Multiple swallows: Ineffective   General Information: Caregiver present: No  Diet Prior to this Study: NPO   No data recorded  No data recorded  No data recorded  History of Recent Intubation: No  Behavior/Cognition: Alert; Cooperative; Pleasant mood Self-Feeding Abilities: Able to self-feed Baseline vocal quality/speech: Normal Volitional Cough: Able to elicit Volitional Swallow: Able to elicit No data recorded Goal Planning: Prognosis for improved oropharyngeal  function: Good No data recorded No data recorded Patient/Family Stated Goal: none stated No data recorded Pain: Pain Assessment Pain Assessment: Faces Faces Pain Scale: 4 Pain Location: neck Pain Descriptors / Indicators: Grimacing; Guarding Pain Intervention(s): Monitored during session End of Session: Start Time:SLP Start Time (ACUTE ONLY): 1210 Stop Time: SLP Stop Time (ACUTE ONLY): 1230 Time Calculation:SLP Time Calculation (min) (ACUTE ONLY): 20 min Charges: SLP Evaluations $ SLP Speech Visit: 1 Visit SLP Evaluations $MBS Swallow: 1 Procedure $Swallowing Treatment: 1 Procedure SLP visit diagnosis: SLP Visit Diagnosis: Dysphagia, pharyngoesophageal phase (R13.14) Past Medical History: Past Medical History: Diagnosis Date  Anemia   Complication of anesthesia   Diabetes mellitus (HCC)   TYPE 2  GERD (gastroesophageal reflux disease)   History of blood transfusion   GI bleed  History of colon polyps   History of hiatal hernia   Hyperlipemia   Hypertension   Osteoarthritis   PAD (peripheral artery disease) (HCC)   a. stenting of his left common iliac artery >20 years ago. b. h/o LEIA stent and 2 stents to R SFA in 2011. c. 04/2014:  s/p PTA of right SFA for in-stent restenosis, occluded left SFA  PONV (postoperative nausea and vomiting)   no porblem with the last 3 surgeries  RBBB (right bundle branch block with left anterior fascicular block)   NUCLEAR STRESS TEST, 08/18/2010 -  no significant wall motion abnoramlities noted, post-stress EF 69%, normal myocardial perfusion study  Sinus tachycardia   a. Noted during admission 04/2014 but upon review seems to be frequent finding for patient.  Stenosis of carotid artery   a. 50% right carotid stenosis by angiogram 04/2014. Past Surgical History: Past Surgical History: Procedure Laterality Date  ABDOMINAL AORTOGRAM W/LOWER EXTREMITY N/A 01/27/2017  Procedure: Abdominal Aortogram w/Lower Extremity;  Surgeon: Margherita Shell, MD;  Location: [facility];  Service:  Cardiovascular;  Laterality: N/A;  ABDOMINAL AORTOGRAM W/LOWER EXTREMITY N/A 06/08/2017  Procedure: ABDOMINAL AORTOGRAM W/LOWER EXTREMITY;  Surgeon: Margherita Shell, MD;  Location: [facility];  Service: Cardiovascular;  Laterality: N/A;  ABDOMINAL AORTOGRAM W/LOWER EXTREMITY N/A 08/31/2017  Procedure: ABDOMINAL AORTOGRAM W/LOWER EXTREMITY;  Surgeon: Margherita Shell, MD;  Location: [facility];  Service: Cardiovascular;  Laterality: N/A;  rt. unilateral  ANGIOPLASTY / STENTING FEMORAL    ANGIOPLASTY / STENTING ILIAC    ANTERIOR CERVICAL CORPECTOMY N/A 10/12/2023  Procedure: ANTERIOR CERVICAL DECOMPRESSION VIA CORPECTOMY WITH RECONSTRUCTION INCLUDING ANTERIOR CERVICAL PLATING CERVICAL FIVE-SIX,CERVICAL SIX-SEVEN;  Surgeon: Elna Haggis, MD;  Location: [facility] OR;  Service: Neurosurgery;  Laterality: N/A;  AORTA - BILATERAL FEMORAL ARTERY BYPASS GRAFT N/A 07/06/2018  Procedure: AORTA BIFEMORAL BYPASS GRAFT USING 14X7MM X 40CM HEMASHIELD GOLD GRAFT;  Surgeon: Margherita Shell, MD;  Location: [facility] OR;  Service: Vascular;  Laterality: N/A;  CEREBRAL ANGIOGRAM N/A 05/14/2014  Procedure: CEREBRAL ANGIOGRAM;  Surgeon: Avanell Leigh, MD;  Location: [facility] CATH LAB;  Service: Cardiovascular;  Laterality: N/A;  COLONOSCOPY W/ POLYPECTOMY    ENDARTERECTOMY Right 09/08/2017  ENDARTERECTOMY FEMORAL Right 09/08/2017  Procedure: REDO RIGHT FEMORAL ENDARTECTOMY WITH PATCH ANGIOPLASTY.;  Surgeon: Margherita Shell, MD;  Location: [facility] OR;  Service: Vascular;  Laterality: Right;  EYE SURGERY Bilateral   cataract  FEMORAL ARTERY STENT Right 05/12/2010  Stented distally with a 6x100 Abbott absolute stent and proximally with a 6x60 Cook Zilver stent resulting in the reduction of the proximal segment 80% and mid segment 60-70% to 0% residual, LEFT common femoral artery stented with a 7x3 Smart stent resulting in reduction of 90% stenosis to 0% residual  FEMORAL-POPLITEAL BYPASS GRAFT Right 09/18/2016  Procedure: BYPASS GRAFT  FEMORAL-POPLITEAL ARTERY;  Surgeon: Margherita Shell, MD;  Location: [facility] OR;  Service: Vascular;  Laterality: Right;  FEMORAL-POPLITEAL BYPASS GRAFT Right 09/08/2017  Procedure: REDO BYPASS GRAFT FEMORAL-POPLITEAL ARTERY;  Surgeon: Margherita Shell, MD;  Location: [facility] OR;  Service: Vascular;  Laterality: Right;  FEMORAL-POPLITEAL BYPASS GRAFT Right 07/06/2018  Procedure: REVISION RIGHT FEMORAL TO POPLITEAL ARTERY BYPASS GRAFT;  Surgeon: Margherita Shell, MD;  Location: [facility];  Service: Vascular;  Laterality: Right;  IR CHOLANGIOGRAM EXISTING TUBE  08/04/2018  IR EXCHANGE BILIARY DRAIN  01/22/2019  IR PERC CHOLECYSTOSTOMY  07/19/2018  IR RADIOLOGIST EVAL & MGMT  08/31/2018  IR RADIOLOGIST EVAL & MGMT  01/17/2019  IR RADIOLOGIST EVAL & MGMT  02/01/2019  IR RADIOLOGIST EVAL & MGMT  02/08/2019  KNEE ARTHROSCOPY    left  LOWER EXTREMITY ANGIOGRAM N/A 05/14/2014  Procedure: LOWER EXTREMITY ANGIOGRAM;  Surgeon: Avanell Leigh, MD;  Location: [facility] CATH LAB;  Service: Cardiovascular;  Laterality: N/A;  LOWER EXTREMITY ANGIOGRAPHY N/A 09/21/2016  Procedure: Lower Extremity Angiography;  Surgeon: Arvil Lauber, MD;  Location: [facility] INVAS[facility] CV LAB;  Service: Cardiovascular;  Laterality: N/A;  PERIPHERAL VASCULAR ATHERECTOMY Right 01/27/2017  Procedure: PERIPHERAL VASCULAR ATHERECTOMY;  Surgeon: Margherita Shell, MD;  Location:  [facility];  Service: Cardiovascular;  Laterality: Right;  PERIPHERAL VASCULAR BALLOON ANGIOPLASTY Right 06/08/2017  Procedure: PERIPHERAL VASCULAR BALLOON ANGIOPLASTY;  Surgeon: Margherita Shell, MD;  Location: [facility];  Service: Cardiovascular;  Laterality: Right;  common femoral and superficial femoral arteries  PERIPHERAL VASCULAR CATHETERIZATION N/A 04/20/2016  Procedure: Lower Extremity Intervention;  Surgeon: Avanell Leigh, MD;  Location: [facility] INVASIVE CV LAB;  Service: Cardiovascular;  Laterality: N/A;  PERIPHERAL VASCULAR INTERVENTION  08/31/2017  Procedure: PERIPHERAL VASCULAR INTERVENTION;   Surgeon: Margherita Shell, MD;  Location: [facility];  Service: Cardiovascular;;  REIA  REVERSE SHOULDER ARTHROPLASTY Right 12/07/2016  REVERSE SHOULDER ARTHROPLASTY Right 12/07/2016  Procedure: REVERSE SHOULDER ARTHROPLASTY;  Surgeon: Winston Hawking, MD;  Location: [facility] OR;  Service: Orthopedics;  Laterality: Right;  ROTATOR CUFF REPAIR Right 2003  SFA Right 05/14/2014  PTA  OF RT SFA         DR BERRY DeBlois, [name] 10/14/2023, 1:52 PM         Scheduled Meds:  acetaminophen   1,000 mg Oral Q6H   Chlorhexidine  Gluconate Cloth  6 each Topical Daily   docusate sodium   100 mg Oral BID   DULoxetine   20 mg Oral Daily   feeding supplement  237 mL Oral BID BM   ferrous sulfate   325 mg Oral Q breakfast   insulin  aspart  0-5 Units Subcutaneous QHS   insulin  aspart  0-9 Units Subcutaneous TID WC   metoprolol  succinate  50 mg Oral Daily   mirabegron  ER  50 mg Oral Daily   pantoprazole   40 mg Oral BID   predniSONE   20 mg Oral BID WC   Followed by   Cecily Cohen ON 10/17/2023] predniSONE   10 mg Oral BID WC   Followed by   Cecily Cohen ON 10/21/2023] predniSONE   10 mg Oral Q breakfast   pregabalin   50 mg Oral BID   rosuvastatin   20 mg Oral QPM   senna  1 tablet Oral BID   sodium chloride  flush  3 mL Intravenous Q12H   Continuous Infusions:      LOS: 12 days    Time spent: 40 minutes    Vada Garibaldi, MD [facility]

## 2023-10-15 NOTE — Plan of Care (Signed)
  Problem: Education: Goal: Ability to describe self-care measures that may prevent or decrease complications (Diabetes Survival Skills Education) will improve Outcome: Progressing Goal: Individualized Educational Video(s) Outcome: Progressing   Problem: Coping: Goal: Ability to adjust to condition or change in health will improve Outcome: Progressing   Problem: Fluid Volume: Goal: Ability to maintain a balanced intake and output will improve Outcome: Progressing   Problem: Health Behavior/Discharge Planning: Goal: Ability to identify and utilize available resources and services will improve Outcome: Progressing Goal: Ability to manage health-related needs will improve Outcome: Progressing   Problem: Metabolic: Goal: Ability to maintain appropriate glucose levels will improve Outcome: Progressing   Problem: Nutritional: Goal: Maintenance of adequate nutrition will improve Outcome: Progressing Goal: Progress toward achieving an optimal weight will improve Outcome: Progressing   Problem: Skin Integrity: Goal: Risk for impaired skin integrity will decrease Outcome: Progressing   Problem: Tissue Perfusion: Goal: Adequacy of tissue perfusion will improve Outcome: Progressing   Problem: Education: Goal: Knowledge of General Education information will improve Description: Including pain rating scale, medication(s)/side effects and non-pharmacologic comfort measures Outcome: Progressing   Problem: Health Behavior/Discharge Planning: Goal: Ability to manage health-related needs will improve Outcome: Progressing   Problem: Clinical Measurements: Goal: Ability to maintain clinical measurements within normal limits will improve Outcome: Progressing Goal: Will remain free from infection Outcome: Progressing Goal: Diagnostic test results will improve Outcome: Progressing Goal: Respiratory complications will improve Outcome: Progressing Goal: Cardiovascular complication will  be avoided Outcome: Progressing   Problem: Activity: Goal: Risk for activity intolerance will decrease Outcome: Progressing   Problem: Nutrition: Goal: Adequate nutrition will be maintained Outcome: Progressing   Problem: Coping: Goal: Level of anxiety will decrease Outcome: Progressing   Problem: Elimination: Goal: Will not experience complications related to bowel motility Outcome: Progressing Goal: Will not experience complications related to urinary retention Outcome: Progressing   Problem: Pain Managment: Goal: General experience of comfort will improve and/or be controlled Outcome: Progressing   Problem: Safety: Goal: Ability to remain free from injury will improve Outcome: Progressing   Problem: Skin Integrity: Goal: Risk for impaired skin integrity will decrease Outcome: Progressing   Problem: Education: Goal: Ability to verbalize activity precautions or restrictions will improve Outcome: Progressing Goal: Knowledge of the prescribed therapeutic regimen will improve Outcome: Progressing Goal: Understanding of discharge needs will improve Outcome: Progressing   Problem: Activity: Goal: Ability to avoid complications of mobility impairment will improve Outcome: Progressing Goal: Ability to tolerate increased activity will improve Outcome: Progressing Goal: Will remain free from falls Outcome: Progressing   Problem: Bowel/Gastric: Goal: Gastrointestinal status for postoperative course will improve Outcome: Progressing   Problem: Clinical Measurements: Goal: Ability to maintain clinical measurements within normal limits will improve Outcome: Progressing Goal: Postoperative complications will be avoided or minimized Outcome: Progressing Goal: Diagnostic test results will improve Outcome: Progressing   Problem: Pain Management: Goal: Pain level will decrease Outcome: Progressing

## 2023-10-15 NOTE — Progress Notes (Signed)
 Speech Language Pathology Treatment: Dysphagia  Patient Details Name: Christian Bailey MRN: [id number] DOB: [birth year] Today's Date: 10/15/2023 Time: 2841-3244 SLP Time Calculation (min) (ACUTE ONLY): 18 min  Assessment / Plan / Recommendation Clinical Impression  Pt much improved today; her reports tolerating sips with occasional coughing. Under SLP observation pt drank several ounces of water with small sips and no coughing, multiple swallows or wet vocal quality. Pt also able to consume applesauce with ease. Continued to encourage effortful swallows and following solids with liquids. Will upgrade to Minced and moist solids (dys2/IDDSI level 5) with thin liquids. Will f/u as needed. If pt struggles with solids, can downgrade to puree (dys1).    HPI HPI: Patient is an 83 yo male presenting to the ED status post fall on 10/02/23. Patient hit head in fall, with CT of head and spine clear. MRI finding edematous changes from C2-C5. 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  Moderate foraminal stenosis bilaterally at C6-7 and on the right at C7-T1.  Mild foraminal narrowing bilaterally at C2-3 and C3-4. Underwent C6 discectomy 10/13/23. PMH includes:  peripheral artery disease  on Plavix  and Crestor , RBBB, chronic HFpEF, CKD 4, carotid stenosis, hypertension, hyperlipidemia, type 2 diabetes, chronic anxiety/depression, iron deficiency anemia, GERD.      SLP Plan  Continue with current plan of care      Recommendations for follow up therapy are one component of a multi-disciplinary discharge planning process, led by the attending physician.  Recommendations may be updated based on patient status, additional functional criteria and insurance authorization.    Recommendations  Diet recommendations: Dysphagia 2 (fine chop);Thin liquid Liquids provided via: Cup;Straw Medication Administration: Whole meds with  puree Supervision: Patient able to self feed Compensations: Slow rate;Small sips/bites;Follow solids with liquid;Effortful swallow Postural Changes and/or Swallow Maneuvers: Seated upright 90 degrees;Upright 30-60 min after meal                        Dysphagia, pharyngoesophageal phase (R13.14)     Continue with current plan of care     Christian Bailey, Christian Bailey  10/15/2023, 10:06 AM

## 2023-10-15 NOTE — Progress Notes (Signed)
 Occupational Therapy Treatment Patient Details Name: Christian Bailey MRN: [id number] DOB: 06-20-1941 Today's Date: 10/15/2023   History of present illness Patient is an 83 yo male presenting to the ED status post fall on 10/02/23. MRI finding edematous changes from C2-C5. Compression of the spinal cord at C5-6 and C6-7. Moderate foraminal stenosis bilaterally at C6-7 and on the right at C7-T1.  Mild foraminal narrowing bilaterally at C2-3 and C3-4. ACDF performed 4/15. PMH includes:  peripheral artery disease on Plavix  and Crestor , RBBB, chronic HFpEF, CKD 4, carotid stenosis, hypertension, hyperlipidemia, type 2 diabetes, chronic anxiety/depression, iron deficiency anemia, GERDsecondary to disc disease and ossification of posterior longitudinal ligament.  Moderate foraminal stenosis bilaterally at C6-7 and on the right at C7-T1.  Mild foraminal narrowing bilaterally at C2-3 and C3-4; AIR following for likely post-acute rehab; Neurosurgery consulted, considering cervical decompression surgery;  PMH includes:  peripheral artery disease on Plavix  and Crestor , RBBB, chronic HFpEF, CKD 4, carotid stenosis, hypertension, hyperlipidemia, type 2 diabetes, chronic anxiety/depression, iron deficiency anemia, GERD   OT comments  Pt progressing well towards goals. Session focused on functional mobility and standing balance. Pt progressed bed mobility to mod +2 for trunk assist. X3 standing trials completed with mod assist +2, once standing pt able to maintain standing with min assist +2. During initial STS, pt experienced clonus in RLE, able to settle with deep pressure. Unable to progress to SPT d/t decreased strength and activity tolerance. Continue to recommend >3 hours of skilled rehab daily to optimize independence levels. Will continue to follow acutely.       If plan is discharge home, recommend the following:  Two people to help with walking and/or transfers;Assistance with cooking/housework;Assist for  transportation;Help with stairs or ramp for entrance;Two people to help with bathing/dressing/bathroom   Equipment Recommendations  Other (comment) (Defer to next venue)    Recommendations for Other Services      Precautions / Restrictions Precautions Precautions: Fall;Cervical Precaution Booklet Issued: No Recall of Precautions/Restrictions: Impaired Precaution/Restrictions Comments: very confused, asking if brace can be lowered Required Braces or Orthoses: Cervical Brace Cervical Brace: Soft collar Restrictions Weight Bearing Restrictions Per Provider Order: No       Mobility Bed Mobility Overal bed mobility: Needs Assistance Bed Mobility: Rolling, Sidelying to Sit Rolling: Mod assist, +2 for physical assistance, Used rails Sidelying to sit: Mod assist, +2 for physical assistance, HOB elevated, Used rails       General bed mobility comments: Cues to walk legs off bed no assist, mod assist for trunk    Transfers Overall transfer level: Needs assistance Equipment used: Rolling walker (2 wheels), Ambulation equipment used Transfers: Bed to chair/wheelchair/BSC, Sit to/from Stand Sit to Stand: Mod assist, +2 physical assistance, +2 safety/equipment           General transfer comment: Pt able to complete STS, unable to sequence SPT with RW Transfer via Lift Equipment: Stedy   Balance Overall balance assessment: Needs assistance Sitting-balance support: Bilateral upper extremity supported, Feet supported Sitting balance-Leahy Scale: Poor Sitting balance - Comments: posterior lean requiring mod assist to correct, Postural control: Posterior lean Standing balance support: Bilateral upper extremity supported, During functional activity Standing balance-Leahy Scale: Poor Standing balance comment: Pt able to progress to maintain standing with mod assist +1       ADL either performed or assessed with clinical judgement   ADL Overall ADL's : Needs assistance/impaired        Toilet Transfer: Total assistance;+2 for safety/equipment;+2 for physical assistance Toilet Transfer  Details (indicate cue type and reason): Pt able to come to stand with RW, unable to pivot +2 with stedy Toileting- Clothing Manipulation and Hygiene: Total assistance;+2 for physical assistance;+2 for safety/equipment;Sit to/from stand Toileting - Clothing Manipulation Details (indicate cue type and reason): Total assist, pt able to maintain standing with +1     Functional mobility during ADLs: Total assistance;+2 for physical assistance;+2 for safety/equipment      Extremity/Trunk Assessment Upper Extremity Assessment Upper Extremity Assessment: Generalized weakness;RUE deficits/detail;LUE deficits/detail RUE Deficits / Details: Decreased strength, bil decreased shoulder ROM RUE: Shoulder pain with ROM RUE Coordination: decreased fine motor;decreased gross motor LUE Deficits / Details: Decreased strength, bil decreased shoulder ROM LUE: Shoulder pain with ROM LUE Coordination: decreased fine motor;decreased gross motor   Lower Extremity Assessment Lower Extremity Assessment: Defer to PT evaluation        Vision   Vision Assessment?: No apparent visual deficits         Communication Communication Communication: No apparent difficulties Factors Affecting Communication: Hearing impaired   Cognition Arousal: Alert Behavior During Therapy: WFL for tasks assessed/performed Cognition: No apparent impairments       OT - Cognition Comments: Decreased safety awareness but overall WFL       Following commands: Intact Following commands impaired: Follows one step commands inconsistently, Follows one step commands with increased time      Cueing   Cueing Techniques: Verbal cues, Tactile cues  Exercises Exercises: Hand exercises Hand Exercises Digit Composite Flexion: Both, 10 reps, Squeeze ball, Other (comment), Strengthening, AROM (Foam squeeze for L hand)        General Comments Wife present for session    Pertinent Vitals/ Pain       Pain Assessment Pain Assessment: Faces Faces Pain Scale: Hurts little more Pain Location: neck Pain Descriptors / Indicators: Grimacing, Guarding Pain Intervention(s): Monitored during session   Frequency  Min 2X/week        Progress Toward Goals  OT Goals(current goals can now be found in the care plan section)  Progress towards OT goals: Progressing toward goals  Acute Rehab OT Goals Patient Stated Goal: To get better OT Goal Formulation: With patient Time For Goal Achievement: 10/17/23 Potential to Achieve Goals: Fair ADL Goals Pt Will Perform Lower Body Bathing: with min assist;sitting/lateral leans;sit to/from stand Pt Will Perform Lower Body Dressing: with mod assist;sitting/lateral leans Pt Will Transfer to Toilet: with max assist;stand pivot transfer;bedside commode Pt Will Perform Toileting - Clothing Manipulation and hygiene: with mod assist;sit to/from stand Additional ADL Goal #1: Patient will be able to transition to EOB with min A as a precursor to OOB activities.  Plan         AM-PAC OT "6 Clicks" Daily Activity     Outcome Measure   Help from another person eating meals?: A Little Help from another person taking care of personal grooming?: A Little Help from another person toileting, which includes using toliet, bedpan, or urinal?: Total Help from another person bathing (including washing, rinsing, drying)?: A Lot Help from another person to put on and taking off regular upper body clothing?: A Little Help from another person to put on and taking off regular lower body clothing?: Total 6 Click Score: 13    End of Session Equipment Utilized During Treatment: Gait belt;Rolling walker (2 wheels);Other (comment) (stedy)  OT Visit Diagnosis: Unsteadiness on feet (R26.81);Other abnormalities of gait and mobility (R26.89);Repeated falls (R29.6);Muscle weakness (generalized)  (M62.81);History of falling (Z91.81);Pain   Activity Tolerance Patient  tolerated treatment well   Patient Left in chair;with call bell/phone within reach;with chair alarm set   Nurse Communication Mobility status;Need for lift equipment        Time: 1340-1407 OT Time Calculation (min): 27 min  Charges: OT General Charges $OT Visit: 1 Visit OT Treatments $Self Care/Home Management : 23-37 mins  Delmer Ferraris, OT  Acute Rehabilitation Services Office [phone] Secure chat preferred   Mickael Alamo 10/15/2023, 4:41 PM

## 2023-10-16 DIAGNOSIS — M549 Dorsalgia, unspecified: Secondary | ICD-10-CM | POA: Diagnosis not present

## 2023-10-16 DIAGNOSIS — W19XXXA Unspecified fall, initial encounter: Secondary | ICD-10-CM | POA: Diagnosis not present

## 2023-10-16 DIAGNOSIS — F05 Delirium due to known physiological condition: Secondary | ICD-10-CM | POA: Diagnosis not present

## 2023-10-16 DIAGNOSIS — M4712 Other spondylosis with myelopathy, cervical region: Secondary | ICD-10-CM | POA: Diagnosis not present

## 2023-10-16 LAB — COMPREHENSIVE METABOLIC PANEL WITH GFR
ALT: 8 U/L (ref 0–44)
AST: 15 U/L (ref 15–41)
Albumin: 2.9 g/dL — ABNORMAL LOW (ref 3.5–5.0)
Alkaline Phosphatase: 57 U/L (ref 38–126)
Anion gap: 11 (ref 5–15)
BUN: 44 mg/dL — ABNORMAL HIGH (ref 8–23)
CO2: 22 mmol/L (ref 22–32)
Calcium: 8.8 mg/dL — ABNORMAL LOW (ref 8.9–10.3)
Chloride: 107 mmol/L (ref 98–111)
Creatinine, Ser: 1.68 mg/dL — ABNORMAL HIGH (ref 0.61–1.24)
GFR, Estimated: 40 mL/min — ABNORMAL LOW (ref >60)
Glucose, Bld: 222 mg/dL — ABNORMAL HIGH (ref 70–99)
Potassium: 4.2 mmol/L (ref 3.5–5.1)
Sodium: 140 mmol/L (ref 135–145)
Total Bilirubin: 0.4 mg/dL (ref 0.0–1.2)
Total Protein: 6.4 g/dL — ABNORMAL LOW (ref 6.5–8.1)

## 2023-10-16 LAB — CBC WITH DIFFERENTIAL/PLATELET
Abs Immature Granulocytes: 0.08 10*3/uL — ABNORMAL HIGH (ref 0.00–0.07)
Basophils Absolute: 0 10*3/uL (ref 0.0–0.1)
Basophils Relative: 0 %
Eosinophils Absolute: 0 10*3/uL (ref 0.0–0.5)
Eosinophils Relative: 0 %
HCT: 28.9 % — ABNORMAL LOW (ref 39.0–52.0)
Hemoglobin: 9.4 g/dL — ABNORMAL LOW (ref 13.0–17.0)
Immature Granulocytes: 1 %
Lymphocytes Relative: 8 %
Lymphs Abs: 0.8 10*3/uL (ref 0.7–4.0)
MCH: 28.9 pg (ref 26.0–34.0)
MCHC: 32.5 g/dL (ref 30.0–36.0)
MCV: 88.9 fL (ref 80.0–100.0)
Monocytes Absolute: 0.5 10*3/uL (ref 0.1–1.0)
Monocytes Relative: 5 %
Neutro Abs: 8.6 10*3/uL — ABNORMAL HIGH (ref 1.7–7.7)
Neutrophils Relative %: 86 %
Platelets: 429 10*3/uL — ABNORMAL HIGH (ref 150–400)
RBC: 3.25 MIL/uL — ABNORMAL LOW (ref 4.22–5.81)
RDW: 13.7 % (ref 11.5–15.5)
WBC: 10 10*3/uL (ref 4.0–10.5)
nRBC: 0 % (ref 0.0–0.2)

## 2023-10-16 LAB — GLUCOSE, CAPILLARY
Glucose-Capillary: 225 mg/dL — ABNORMAL HIGH (ref 70–99)
Glucose-Capillary: 152 mg/dL — ABNORMAL HIGH (ref 70–99)
Glucose-Capillary: 199 mg/dL — ABNORMAL HIGH (ref 70–99)
Glucose-Capillary: 283 mg/dL — ABNORMAL HIGH (ref 70–99)

## 2023-10-16 LAB — PHOSPHORUS: Phosphorus: 3.5 mg/dL (ref 2.5–4.6)

## 2023-10-16 LAB — MAGNESIUM: Magnesium: 2 mg/dL (ref 1.7–2.4)

## 2023-10-16 NOTE — Progress Notes (Signed)
 PROGRESS NOTE    Christian Bailey  ZOX:[id number] DOB: [birth year] DOA: 10/02/2023 PCP: Lonzie Robins, MD   Brief Narrative:  83 year old with history of peripheral artery disease on Plavix  and Crestor , right bundle branch block, chronic diastolic heart failure, CKD stage IV, ambulatory dysfunction, hypertension,  hyperlipidemia,  type 2 diabetes, chronic anxiety and depression and GERD presented to the emergency room with a fall.  Patient reportedly with worsening mobility since last 6 months.  In the emergency room hemodynamically stable.  Imaging consistent with posterior spinous ligament injury from C2-C5.  Difficult to tolerate pain medications and getting delirious with narcotics.  Followed by neurosurgery.  Also with leukocytosis. Patient developed severe agitation and hyper active delirium.  4/14 Encephalopathy resolving 4/15 Cervical decompression with Neurosurgery, [facility] - tolerated well 4/18 Patient clinically improving.  Waiting to go to CIR. Clinically stable for discharge.   Assessment & Plan:   Principal Problem:   Fall Active Problems:   Intractable back pain   Delirium due to another medical condition, acute, hyperactive   Acute metabolic encephalopathy, multifactorial History of hospital delirium/sundowning Likely secondary to polypharmacy, hospital-acquired delirium and narcotics Had developed severe delirium and hallucination in the interim but now resolved, back to baseline. Responded well to haloperidol  and Seroquel . Tylenol  1 g every 6 hours around-the-clock.  Baclofen  tolerated well.   Trauma, cervical myelopathy: Followed by neurosurgery.   Successful cervical spine decompression and stabilization 4/15. Judicious use of narcotics given above Steroid taper ongoing Continue PT OT and referred to CIR. Clinically improved.   Acute intractable pain Peripheral arterial disease Continue Cymbalta , Tylenol  1 g every 6 hours, Lyrica  50 mg twice daily, Flexeril  5  mg 3 times daily.  Bowel regimen.    CKD stage IV: At about baseline.   Anxiety depression: On Cymbalta .   Leukocytosis: Patient presented with WBC count of 19,000.  Urine was clear.  No other clear source of infection.  Treated with Rocephin  and azithromycin . WBC already trending down.   Procalcitonin 5.5.  WC count trending down. Cultures negative so far.  Normalized.  Completed antibiotics.   Essential hypertension: Blood pressure stable.  Holding lisinopril .  On Toprol -XL.   Type 2 diabetes: Known A1c 8.  On sliding scale.   Acute urinary retention: With altered mentation, he had to have multiple In-N-Out cath.  Currently has a Foley catheter.  Voiding trial today.   Dysphagia: Chronic dysphagia and along with retropharyngeal edema from cervical spine surgery.  Improving.  As discussed with neurosurgery, prednisone  taper prescribed to help with edema. Needs aspiration precautions.  DVT prophylaxis: SCD's Start: 10/12/23 1208 SCDs Start: 10/02/23 2244   Code Status:   Code Status: Limited: Do not attempt resuscitation (DNR) -DNR-LIMITED -Do Not Intubate/DNI   Family Communication: None present  Status is: Inpatient  Dispo: The patient is from: Home              Anticipated d/c is to: CIR              Anticipated d/c date is: Imminent              Patient currently is medically stable for discharge  Consultants:  Neurosurgery  Procedures:  C-spine decompression and stabilization 4/15  Antimicrobials:  Perioperatively  Subjective: No acute issues or events overnight denies nausea vomiting diarrhea constipation any fevers or chest pain  Objective: Vitals:   10/15/23 2032 10/16/23 0441 10/16/23 0511 10/16/23 0726  BP: (!) 121/56 (!) 124/57  (!) 161/77  Pulse: 83 83  96  Resp: 20 18  17   Temp: 98.2 F (36.8 C) 98.2 F (36.8 C)  97.6 F (36.4 C)  TempSrc: Oral Oral  Oral  SpO2: 100% 98%  100%  Weight:   60.8 kg   Height:        Intake/Output Summary (Last 24  hours) at 10/16/2023 0757 Last data filed at 10/16/2023 0400 Gross per 24 hour  Intake 240 ml  Output 700 ml  Net -460 ml   Filed Weights   10/12/23 0638 10/13/23 0545 10/16/23 0511  Weight: 65.3 kg 65.3 kg 60.8 kg    Examination:  General:  Pleasantly resting in bed, No acute distress. Neck: Dressing clean/dry/intact; c-collar in place Lungs:  Clear to auscultate bilaterally without rhonchi, wheeze, or rales. Heart:  Regular rate and rhythm.  Without murmurs, rubs, or gallops. Abdomen:  Soft, nontender, nondistended.  Without guarding or rebound. Extremities: Without cyanosis, clubbing, or obvious deformity. Skin:  Warm and dry, no erythema.  Data Reviewed: I have personally reviewed following labs and imaging studies  CBC: No results for input(s): "WBC", "NEUTROABS", "HGB", "HCT", "MCV", "PLT" in the last 168 hours. Basic Metabolic Panel: No results for input(s): "NA", "K", "CL", "CO2", "GLUCOSE", "BUN", "CREATININE", "CALCIUM ", "MG", "PHOS" in the last 168 hours.  GFR: Estimated Creatinine Clearance: 21.7 mL/min (A) (by C-G formula based on SCr of 2.26 mg/dL (H)).  CBG: Recent Labs  Lab 10/15/23 0726 10/15/23 1114 10/15/23 1605 10/15/23 2031 10/16/23 0600  GLUCAP 171* 196* 293* 223* 225*    Recent Results (from the past 240 hours)  Surgical pcr screen     Status: None   Collection Time: 10/12/23  3:24 AM   Specimen: Nasal Mucosa; Nasal Swab  Result Value Ref Range Status   MRSA, PCR NEGATIVE NEGATIVE Final   Staphylococcus aureus NEGATIVE NEGATIVE Final    Comment: (NOTE) The Xpert SA Assay (FDA approved for NASAL specimens in patients 2 years of age and older), is one component of a comprehensive surveillance program. It is not intended to diagnose infection nor to guide or monitor treatment. Performed at [facility], [address], [place], [place] [address]          Radiology Studies: DG Swallowing Func-Speech Pathology Result Date:  10/14/2023 Table formatting from the original result was not included. Modified Barium Swallow Study Patient Details Name: Renzo Vincelette MRN: [id number] Date of Birth: 21-May-1941 Today's Date: 10/14/2023 HPI/PMH: HPI: Patient is an 83 yo male presenting to the ED status post fall on 10/02/23. Patient hit head in fall, with CT of head and spine clear. MRI finding edematous changes from C2-C5. 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  Moderate foraminal stenosis bilaterally at C6-7 and on the right at C7-T1.  Mild foraminal narrowing bilaterally at C2-3 and C3-4. Underwent C6 discectomy 10/13/23. PMH includes:  peripheral artery disease  on Plavix  and Crestor , RBBB, chronic HFpEF, CKD 4, carotid stenosis, hypertension, hyperlipidemia, type 2 diabetes, chronic anxiety/depression, iron deficiency anemia, GERD. Clinical Impression: Pt demonstrates an acute, severe dysphagia with a DIGEST score of 3 due to edema on posterior pharyngeal wall/pharyngeal esophageal segment following ACDF procedure. Pharyngeal wall edema also restricts hyoid excursion against an anchored PES and limits epiglottic deflection. This leads to pharyngeal residual of thins, nectar and puree and with liquids, consistent aspiration during multiple swallow attempts to clear.  Pt sensed aspiration and had severe coughing and  shortness of breath after only a few sips. Though puree is not aspirated, more than 50% of residue remains in valleculae without ability to clear. Likely pills are not being swallowed immediately if given orally. Recommend NPO except for ice,  with close f/u for subjective improvements. Factors that may increase risk of adverse event in presence of aspiration [name] & [name] 2021): Frequent aspiration of large volumes DIGEST Swallow Severity Rating*  Safety:  Efficiency:  Overall Pharyngeal Swallow Severity: 1: mild; 2: moderate; 3: severe; 4:  profound *The Dynamic Imaging Grade of Swallowing Toxicity is standardized for the head and neck cancer population, however, demonstrates promising clinical applications across populations to standardize the clinical rating of pharyngeal swallow safety and severity. Recommendations/Plan: Swallowing Evaluation Recommendations Swallowing Evaluation Recommendations Recommendations: NPO; Ice chips PRN after oral care Medication Administration: Via alternative means Treatment Plan Treatment Plan Treatment recommendations: Therapy as outlined in treatment plan below Functional status assessment: Patient has had a recent decline in their functional status and demonstrates the ability to make significant improvements in function in a reasonable and predictable amount of time. Treatment frequency: Min 2x/week Treatment duration: 2 weeks Interventions: Aspiration precaution training; Patient/family education; Trials of upgraded texture/liquids Recommendations Recommendations for follow up therapy are one component of a multi-disciplinary discharge planning process, led by the attending physician.  Recommendations may be updated based on patient status, additional functional criteria and insurance authorization. Assessment: Orofacial Exam: Orofacial Exam Oral Cavity: Oral Hygiene: WFL Oral Cavity - Dentition: Adequate natural dentition Anatomy: Anatomy: Other (Comment) (edema at C5/6) Boluses Administered: Boluses Administered Boluses Administered: Thin liquids (Level 0); Mildly thick liquids (Level 2, nectar thick); Puree  Oral Impairment Domain: Oral Impairment Domain Lip Closure: No labial escape Tongue control during bolus hold: Cohesive bolus between tongue to palatal seal Bolus transport/lingual motion: Brisk tongue motion Oral residue: Complete oral clearance Location of oral residue : N/A Initiation of pharyngeal swallow : Posterior angle of the ramus  Pharyngeal Impairment Domain: Pharyngeal Impairment Domain Soft  palate elevation: Escape to nasopharynx Laryngeal elevation: Partial superior movement of thyroid  cartilage/partial approximation of arytenoids to epiglottic petiole Anterior hyoid excursion: Partial anterior movement Epiglottic movement: Partial inversion Laryngeal vestibule closure: Incomplete, narrow column air/contrast in laryngeal vestibule Pharyngeal stripping wave : Present - diminished Pharyngoesophageal segment opening: Minimal distention/minimal duration, marked obstruction of flow Tongue base retraction: Narrow column of contrast or air between tongue base and PPW Pharyngeal residue: Majority of contrast within or on pharyngeal structures Location of pharyngeal residue: Valleculae; Pyriform sinuses  Esophageal Impairment Domain: No data recorded Pill: No data recorded Penetration/Aspiration Scale Score: Penetration/Aspiration Scale Score 1.  Material does not enter airway: Puree 7.  Material enters airway, passes BELOW cords and not ejected out despite cough attempt by patient: Mildly thick liquids (Level 2, nectar thick); Thin liquids (Level 0) Compensatory Strategies: Compensatory Strategies Compensatory strategies: Yes Effortful swallow: Ineffective Multiple swallows: Ineffective   General Information: Caregiver present: No  Diet Prior to this Study: NPO   No data recorded  No data recorded  No data recorded  History of Recent Intubation: No  Behavior/Cognition: Alert; Cooperative; Pleasant mood Self-Feeding Abilities: Able to self-feed Baseline vocal quality/speech: Normal Volitional Cough: Able to elicit Volitional Swallow: Able to elicit No data recorded Goal Planning: Prognosis for improved oropharyngeal function: Good No data recorded No data recorded Patient/Family Stated Goal: none stated No data recorded Pain: Pain Assessment Pain Assessment: Faces Faces Pain Scale: 4 Pain Location: neck Pain Descriptors / Indicators: Grimacing; Guarding Pain Intervention(s):  Monitored during session End of  Session: Start Time:SLP Start Time (ACUTE ONLY): 1210 Stop Time: SLP Stop Time (ACUTE ONLY): 1230 Time Calculation:SLP Time Calculation (min) (ACUTE ONLY): 20 min Charges: SLP Evaluations $ SLP Speech Visit: 1 Visit SLP Evaluations $MBS Swallow: 1 Procedure $Swallowing Treatment: 1 Procedure SLP visit diagnosis: SLP Visit Diagnosis: Dysphagia, pharyngoesophageal phase (R13.14) Past Medical History: Past Medical History: Diagnosis Date  Anemia   Complication of anesthesia   Diabetes mellitus (HCC)   TYPE 2  GERD (gastroesophageal reflux disease)   History of blood transfusion   GI bleed  History of colon polyps   History of hiatal hernia   Hyperlipemia   Hypertension   Osteoarthritis   PAD (peripheral artery disease) (HCC)   a. stenting of his left common iliac artery >20 years ago. b. h/o LEIA stent and 2 stents to R SFA in 2011. c. 04/2014:  s/p PTA of right SFA for in-stent restenosis, occluded left SFA  PONV (postoperative nausea and vomiting)   no porblem with the last 3 surgeries  RBBB (right bundle branch block with left anterior fascicular block)   NUCLEAR STRESS TEST, 08/18/2010 - no significant wall motion abnoramlities noted, post-stress EF 69%, normal myocardial perfusion study  Sinus tachycardia   a. Noted during admission 04/2014 but upon review seems to be frequent finding for patient.  Stenosis of carotid artery   a. 50% right carotid stenosis by angiogram 04/2014. Past Surgical History: Past Surgical History: Procedure Laterality Date  ABDOMINAL AORTOGRAM W/LOWER EXTREMITY N/A 01/27/2017  Procedure: Abdominal Aortogram w/Lower Extremity;  Surgeon: Margherita Shell, MD;  Location: [facility];  Service: Cardiovascular;  Laterality: N/A;  ABDOMINAL AORTOGRAM W/LOWER EXTREMITY N/A 06/08/2017  Procedure: ABDOMINAL AORTOGRAM W/LOWER EXTREMITY;  Surgeon: Margherita Shell, MD;  Location: [facility];  Service: Cardiovascular;  Laterality: N/A;  ABDOMINAL AORTOGRAM W/LOWER EXTREMITY N/A 08/31/2017   Procedure: ABDOMINAL AORTOGRAM W/LOWER EXTREMITY;  Surgeon: Margherita Shell, MD;  Location: [facility];  Service: Cardiovascular;  Laterality: N/A;  rt. unilateral  ANGIOPLASTY / STENTING FEMORAL    ANGIOPLASTY / STENTING ILIAC    ANTERIOR CERVICAL CORPECTOMY N/A 10/12/2023  Procedure: ANTERIOR CERVICAL DECOMPRESSION VIA CORPECTOMY WITH RECONSTRUCTION INCLUDING ANTERIOR CERVICAL PLATING CERVICAL FIVE-SIX,CERVICAL SIX-SEVEN;  Surgeon: Elna Haggis, MD;  Location: [facility] OR;  Service: Neurosurgery;  Laterality: N/A;  AORTA - BILATERAL FEMORAL ARTERY BYPASS GRAFT N/A 07/06/2018  Procedure: AORTA BIFEMORAL BYPASS GRAFT USING 14X7MM X 40CM HEMASHIELD GOLD GRAFT;  Surgeon: Margherita Shell, MD;  Location: [facility] OR;  Service: Vascular;  Laterality: N/A;  CEREBRAL ANGIOGRAM N/A 05/14/2014  Procedure: CEREBRAL ANGIOGRAM;  Surgeon: Avanell Leigh, MD;  Location: [facility] CATH LAB;  Service: Cardiovascular;  Laterality: N/A;  COLONOSCOPY W/ POLYPECTOMY    ENDARTERECTOMY Right 09/08/2017  ENDARTERECTOMY FEMORAL Right 09/08/2017  Procedure: REDO RIGHT FEMORAL ENDARTECTOMY WITH PATCH ANGIOPLASTY.;  Surgeon: Margherita Shell, MD;  Location: [facility] OR;  Service: Vascular;  Laterality: Right;  EYE SURGERY Bilateral   cataract  FEMORAL ARTERY STENT Right 05/12/2010  Stented distally with a 6x100 Abbott absolute stent and proximally with a 6x60 Cook Zilver stent resulting in the reduction of the proximal segment 80% and mid segment 60-70% to 0% residual, LEFT common femoral artery stented with a 7x3 Smart stent resulting in reduction of 90% stenosis to 0% residual  FEMORAL-POPLITEAL BYPASS GRAFT Right 09/18/2016  Procedure: BYPASS GRAFT FEMORAL-POPLITEAL ARTERY;  Surgeon: Margherita Shell, MD;  Location: [facility] OR;  Service: Vascular;  Laterality:  Right;  FEMORAL-POPLITEAL BYPASS GRAFT Right 09/08/2017  Procedure: REDO BYPASS GRAFT FEMORAL-POPLITEAL ARTERY;  Surgeon: Margherita Shell, MD;  Location: [facility] OR;  Service: Vascular;  Laterality: Right;   FEMORAL-POPLITEAL BYPASS GRAFT Right 07/06/2018  Procedure: REVISION RIGHT FEMORAL TO POPLITEAL ARTERY BYPASS GRAFT;  Surgeon: Margherita Shell, MD;  Location: [facility];  Service: Vascular;  Laterality: Right;  IR CHOLANGIOGRAM EXISTING TUBE  08/04/2018  IR EXCHANGE BILIARY DRAIN  01/22/2019  IR PERC CHOLECYSTOSTOMY  07/19/2018  IR RADIOLOGIST EVAL & MGMT  08/31/2018  IR RADIOLOGIST EVAL & MGMT  01/17/2019  IR RADIOLOGIST EVAL & MGMT  02/01/2019  IR RADIOLOGIST EVAL & MGMT  02/08/2019  KNEE ARTHROSCOPY    left  LOWER EXTREMITY ANGIOGRAM N/A 05/14/2014  Procedure: LOWER EXTREMITY ANGIOGRAM;  Surgeon: Avanell Leigh, MD;  Location: [facility] CATH LAB;  Service: Cardiovascular;  Laterality: N/A;  LOWER EXTREMITY ANGIOGRAPHY N/A 09/21/2016  Procedure: Lower Extremity Angiography;  Surgeon: Arvil Lauber, MD;  Location: [facility] INVASIVE CV LAB;  Service: Cardiovascular;  Laterality: N/A;  PERIPHERAL VASCULAR ATHERECTOMY Right 01/27/2017  Procedure: PERIPHERAL VASCULAR ATHERECTOMY;  Surgeon: Margherita Shell, MD;  Location: [facility];  Service: Cardiovascular;  Laterality: Right;  PERIPHERAL VASCULAR BALLOON ANGIOPLASTY Right 06/08/2017  Procedure: PERIPHERAL VASCULAR BALLOON ANGIOPLASTY;  Surgeon: Margherita Shell, MD;  Location: [facility];  Service: Cardiovascular;  Laterality: Right;  common femoral and superficial femoral arteries  PERIPHERAL VASCULAR CATHETERIZATION N/A 04/20/2016  Procedure: Lower Extremity Intervention;  Surgeon: Avanell Leigh, MD;  Location: [facility] INVASIVE CV LAB;  Service: Cardiovascular;  Laterality: N/A;  PERIPHERAL VASCULAR INTERVENTION  08/31/2017  Procedure: PERIPHERAL VASCULAR INTERVENTION;  Surgeon: Margherita Shell, MD;  Location: [facility];  Service: Cardiovascular;;  REIA  REVERSE SHOULDER ARTHROPLASTY Right 12/07/2016  REVERSE SHOULDER ARTHROPLASTY Right 12/07/2016  Procedure: REVERSE SHOULDER ARTHROPLASTY;  Surgeon: Winston Hawking, MD;  Location: [facility] OR;  Service: Orthopedics;  Laterality:  Right;  ROTATOR CUFF REPAIR Right 2003  SFA Right 05/14/2014  PTA  OF RT SFA         DR BERRY DeBlois, [name] 10/14/2023, 1:52 PM       Scheduled Meds:  acetaminophen   1,000 mg Oral Q6H   Chlorhexidine  Gluconate Cloth  6 each Topical Daily   docusate sodium   100 mg Oral BID   DULoxetine   20 mg Oral Daily   feeding supplement  237 mL Oral BID BM   ferrous sulfate   325 mg Oral Q breakfast   insulin  aspart  0-5 Units Subcutaneous QHS   insulin  aspart  0-9 Units Subcutaneous TID WC   metoprolol  succinate  50 mg Oral Daily   mirabegron  ER  50 mg Oral Daily   pantoprazole   40 mg Oral BID   predniSONE   20 mg Oral BID WC   Followed by   Cecily Cohen ON 10/17/2023] predniSONE   10 mg Oral BID WC   Followed by   Cecily Cohen ON 10/21/2023] predniSONE   10 mg Oral Q breakfast   pregabalin   50 mg Oral BID   rosuvastatin   20 mg Oral QPM   senna  1 tablet Oral BID   sodium chloride  flush  3 mL Intravenous Q12H   Continuous Infusions:   LOS: 13 days   Time spent:  Haydee Lipa, DO [facility]  If 7PM-7AM, please contact night-coverage [email or web address]  10/16/2023, 7:57 AM

## 2023-10-16 NOTE — Plan of Care (Signed)
   Problem: Education: Goal: Ability to describe self-care measures that may prevent or decrease complications (Diabetes Survival Skills Education) will improve Outcome: Progressing Goal: Individualized Educational Video(s) Outcome: Progressing   Problem: Coping: Goal: Ability to adjust to condition or change in health will improve Outcome: Progressing

## 2023-10-16 NOTE — Progress Notes (Signed)
 Patient ID: Christian Bailey, male   DOB: 09/19/1940, 83 y.o.   MRN: [id number] Vital signs are stable Motor function appears to be intact Feels that his strength is improving in his upper and lower extremities Swallowing is also improved today Would be a good candidate for inpatient rehabilitation.

## 2023-10-16 NOTE — Progress Notes (Signed)
 Physical Therapy Treatment Patient Details Name: Christian Bailey MRN: [id number] DOB: 1941/02/23 Today's Date: 10/16/2023   History of Present Illness 83 year old presented to the emergency room with a fall.  Patient reportedly with worsening mobility since last 6 months. Imaging consistent with posterior spinous ligament injury from C2-C5.  Also with leukocytosis. Patient developed severe agitation and hyper active delirium. 4/14 Encephalopathy resolving. 4/15 s/p Cervical decompression with Neurosurgery. PMH: peripheral artery disease on Plavix  and Crestor , right bundle branch block, chronic diastolic heart failure, CKD stage IV, ambulatory dysfunction, hypertension, hyperlipidemia, type 2 diabetes, chronic anxiety and depression and GERD    PT Comments  Pt received in supine, alert and pleasantly agreeable to therapy session, with improved tolerance for transfer training with Stedy, then standing from chair with RW for pre-gait training. Pt needing up to +2 modA for dynamic standing tasks and demonstrates consistent posterior bias with seated/standing tasks, but making good progress toward goals and pt agreeable to sit up in recliner at end of session, spouse in room  and encouraging him as well. Pt and spouse given handout for cervical precautions, pt will need reinforcement for following 1-step motor commands well as able this session. Patient will benefit from intensive inpatient follow-up therapy, >3 hours/day, he appears to have good family support.     If plan is discharge home, recommend the following: Two people to help with walking and/or transfers;Two people to help with bathing/dressing/bathroom;Help with stairs or ramp for entrance;Assist for transportation;Assistance with cooking/housework   Can travel by private vehicle     No  Equipment Recommendations  Wheelchair (measurements PT);Wheelchair cushion (measurements PT);Hospital bed (reclining back manual wheelchair)     Recommendations for Other Services       Precautions / Restrictions Precautions Precautions: Fall;Cervical Precaution Booklet Issued: Yes (comment) Recall of Precautions/Restrictions: Impaired Precaution/Restrictions Comments: decreased recall of precautions, not able to verbalize any when prompted; spouse given copy of cx prec handout Required Braces or Orthoses: Cervical Brace Cervical Brace: Soft collar Restrictions Weight Bearing Restrictions Per Provider Order: No     Mobility  Bed Mobility Overal bed mobility: Needs Assistance Bed Mobility: Rolling, Sidelying to Sit Rolling: Used rails, Min assist Sidelying to sit: Mod assist, HOB elevated, +2 for safety/equipment       General bed mobility comments: Cues to walk legs off bed no assist, mod assist for trunk    Transfers Overall transfer level: Needs assistance Equipment used: Rolling walker (2 wheels), Ambulation equipment used Transfers: Bed to chair/wheelchair/BSC, Sit to/from Stand Sit to Stand: Mod assist, +2 physical assistance           General transfer comment: EOB>BSC using Stedy, then [employer] using Stedy, then standing from chair to RW x2 with +2 lift assist; posterior bias upon standing Transfer via Lift Equipment: Stedy  Ambulation/Gait Ambulation/Gait assistance: Max assist, +2 physical assistance   Assistive device: Rolling walker (2 wheels)       Pre-gait activities: standing hip flexion x10 reps with RW support; posterior lean; forward and backward steps x2 in front of chair but not safe to continue due to c/o BLE fatigue and LLE buckling     Stairs             Wheelchair Mobility     Tilt Bed    Modified Rankin (Stroke Patients Only)       Balance Overall balance assessment: Needs assistance Sitting-balance support: Bilateral upper extremity supported, Feet supported, No upper extremity supported Sitting balance-Leahy Scale: Poor Sitting balance - Comments:  posterior  lean at EOB and in chair Postural control: Posterior lean Standing balance support: Bilateral upper extremity supported, During functional activity Standing balance-Leahy Scale: Poor (poor to zero see below) Standing balance comment: static standing modA, dynamic standing +2 mod to maxA                            Communication Communication Communication: No apparent difficulties Factors Affecting Communication: Hearing impaired  Cognition Arousal: Alert Behavior During Therapy: WFL for tasks assessed/performed   PT - Cognitive impairments: Memory, Attention, Initiation, Sequencing, Problem solving, Safety/Judgement, Awareness                       PT - Cognition Comments: Pt appears oriented to location/situation and self/spouse. Pt with improved following of cues today and needs increased time and tactile /visual cues in addition to verbal cues for carryover. Handout discussed with pt/spouse but pt needing reinforcement of precautions and cues for body mechanics. Following commands: Impaired Following commands impaired: Follows one step commands with increased time, Follows multi-step commands inconsistently    Cueing Cueing Techniques: Verbal cues, Tactile cues, Gestural cues, Visual cues  Exercises Other Exercises Other Exercises: seated A/AAROM LAQ x5 reps ea x2 sets Other Exercises: standing hip flexion x10 reps ea Other Exercises: STS x2 reps from chair and 3 reps from elevated surface heights (Stedy/bed) Other Exercises: static sitting EOB cues for anterior lean to reach neutral posture and maintain, AA hip abduction sitting EOB to encourage wider stance x3 reps    General Comments General comments (skin integrity, edema, etc.): spouse [name] present and supportive; pt had some dried/dark blood at tip of penis and thighs, RN called to room to assess; per RN it was from early morning in and out cath, area cleaned with soapy water. Pt reports urinary and  bowel urgency, did not have BM on BSC but did pass gas x2; anterior neck honeycomb dressing appears c/d/i; geomat cushion is in his chair      Pertinent Vitals/Pain Pain Assessment Pain Assessment: Faces Faces Pain Scale: Hurts a little bit Pain Location: neck and shoulders with attempt to reach for Stedy/RW Pain Descriptors / Indicators: Grimacing, Guarding, Sore Pain Intervention(s): Limited activity within patient's tolerance, Monitored during session, Premedicated before session, Repositioned    Home Living                          Prior Function            PT Goals (current goals can now be found in the care plan section) Acute Rehab PT Goals Patient Stated Goal: To move better and have less pain so I can go home with support from my family PT Goal Formulation: With patient Time For Goal Achievement: 10/17/23 Progress towards PT goals: Progressing toward goals    Frequency    Min 2X/week      PT Plan      Co-evaluation              AM-PAC PT "6 Clicks" Mobility   Outcome Measure  Help needed turning from your back to your side while in a flat bed without using bedrails?: A Lot Help needed moving from lying on your back to sitting on the side of a flat bed without using bedrails?: A Lot Help needed moving to and from a bed to a chair (including a wheelchair)?: Total Help needed  standing up from a chair using your arms (e.g., wheelchair or bedside chair)?: A Lot Help needed to walk in hospital room?: Total Help needed climbing 3-5 steps with a railing? : Total 6 Click Score: 9    End of Session Equipment Utilized During Treatment: Gait belt;Cervical collar;Other (comment) (soft collar) Activity Tolerance: Patient tolerated treatment well Patient left: in chair;with call bell/phone within reach;with chair alarm set;with family/visitor present;Other (comment) (pt spouse in room, legs/feet elevated on cushioned surface but not as high as recliner  height, chair slightly reclined for comfort and cushions for cervical and back support and geomat cushion under his hips) Nurse Communication: Mobility status;Need for lift equipment;Other (comment);Precautions (recommend +2 and stedy for transfers) PT Visit Diagnosis: Other abnormalities of gait and mobility (R26.89);Muscle weakness (generalized) (M62.81);Repeated falls (R29.6);Pain Pain - part of body:  (neck, shoulders, back)     Time: 1610-9604 PT Time Calculation (min) (ACUTE ONLY): 41 min  Charges:    $Therapeutic Exercise: 8-22 mins $Therapeutic Activity: 23-37 mins PT General Charges $$ ACUTE PT VISIT: 1 Visit                     Saniyya Gau P., PTA Acute Rehabilitation Services Secure Chat Preferred 9a-5:30pm Office: [phone]    Mariel Shope [facility] 10/16/2023, 2:13 PM

## 2023-10-17 DIAGNOSIS — F05 Delirium due to known physiological condition: Secondary | ICD-10-CM | POA: Diagnosis not present

## 2023-10-17 DIAGNOSIS — M4712 Other spondylosis with myelopathy, cervical region: Secondary | ICD-10-CM | POA: Diagnosis not present

## 2023-10-17 DIAGNOSIS — W19XXXA Unspecified fall, initial encounter: Secondary | ICD-10-CM | POA: Diagnosis not present

## 2023-10-17 DIAGNOSIS — M549 Dorsalgia, unspecified: Secondary | ICD-10-CM | POA: Diagnosis not present

## 2023-10-17 LAB — GLUCOSE, CAPILLARY
Glucose-Capillary: 231 mg/dL — ABNORMAL HIGH (ref 70–99)
Glucose-Capillary: 183 mg/dL — ABNORMAL HIGH (ref 70–99)
Glucose-Capillary: 247 mg/dL — ABNORMAL HIGH (ref 70–99)
Glucose-Capillary: 324 mg/dL — ABNORMAL HIGH (ref 70–99)

## 2023-10-17 NOTE — Progress Notes (Signed)
 Patient ID: Christian Bailey, male   DOB: 18-Apr-1941, 83 y.o.   MRN: [id number] Patient is awake and alert vital signs are stable motor function continues to improve steadily.

## 2023-10-17 NOTE — Plan of Care (Signed)
   Problem: Education: Goal: Ability to describe self-care measures that may prevent or decrease complications (Diabetes Survival Skills Education) will improve Outcome: Progressing Goal: Individualized Educational Video(s) Outcome: Progressing   Problem: Coping: Goal: Ability to adjust to condition or change in health will improve Outcome: Progressing

## 2023-10-17 NOTE — Progress Notes (Signed)
   10/17/23 0955  Mobility  Activity Transferred from bed to chair  Level of Assistance Minimal assist, patient does 75% or more (MinA+2)  Assistive Device Stedy  Activity Response Tolerated fair  Mobility Referral Yes  Mobility visit 1 Mobility  Mobility Specialist Start Time (ACUTE ONLY) 0955  Mobility Specialist Stop Time (ACUTE ONLY) 1022  Mobility Specialist Time Calculation (min) (ACUTE ONLY) 27 min   Mobility Specialist: Progress Note  Pt agreeable to mobility session - received in bed. C/o R shoulder discomfort/ pain when reaching for bar of stedy. Returned to chair with all needs met - call bell within reach. Chair alarm on.   STS (x10) BUE (x10): cross arm reaches BLE (x10): ankle pumps, leg raises, heel slides.   Christian Bailey, BS Mobility Specialist Please contact via SecureChat or  Rehab office at [phone].

## 2023-10-17 NOTE — Plan of Care (Signed)
  Problem: Education: Goal: Ability to describe self-care measures that may prevent or decrease complications (Diabetes Survival Skills Education) will improve Outcome: Progressing Goal: Individualized Educational Video(s) Outcome: Progressing   Problem: Coping: Goal: Ability to adjust to condition or change in health will improve Outcome: Progressing   Problem: Fluid Volume: Goal: Ability to maintain a balanced intake and output will improve Outcome: Progressing   Problem: Health Behavior/Discharge Planning: Goal: Ability to identify and utilize available resources and services will improve Outcome: Progressing Goal: Ability to manage health-related needs will improve Outcome: Progressing   Problem: Metabolic: Goal: Ability to maintain appropriate glucose levels will improve Outcome: Progressing   Problem: Nutritional: Goal: Maintenance of adequate nutrition will improve Outcome: Progressing Goal: Progress toward achieving an optimal weight will improve Outcome: Progressing   Problem: Skin Integrity: Goal: Risk for impaired skin integrity will decrease Outcome: Progressing   Problem: Tissue Perfusion: Goal: Adequacy of tissue perfusion will improve Outcome: Progressing   Problem: Education: Goal: Knowledge of General Education information will improve Description: Including pain rating scale, medication(s)/side effects and non-pharmacologic comfort measures Outcome: Progressing   Problem: Health Behavior/Discharge Planning: Goal: Ability to manage health-related needs will improve Outcome: Progressing   Problem: Clinical Measurements: Goal: Ability to maintain clinical measurements within normal limits will improve Outcome: Progressing Goal: Will remain free from infection Outcome: Progressing Goal: Diagnostic test results will improve Outcome: Progressing Goal: Respiratory complications will improve Outcome: Progressing Goal: Cardiovascular complication will  be avoided Outcome: Progressing   Problem: Activity: Goal: Risk for activity intolerance will decrease Outcome: Progressing   Problem: Nutrition: Goal: Adequate nutrition will be maintained Outcome: Progressing   Problem: Coping: Goal: Level of anxiety will decrease Outcome: Progressing   Problem: Elimination: Goal: Will not experience complications related to bowel motility Outcome: Progressing Goal: Will not experience complications related to urinary retention Outcome: Progressing   Problem: Pain Managment: Goal: General experience of comfort will improve and/or be controlled Outcome: Progressing   Problem: Safety: Goal: Ability to remain free from injury will improve Outcome: Progressing   Problem: Skin Integrity: Goal: Risk for impaired skin integrity will decrease Outcome: Progressing   Problem: Education: Goal: Ability to verbalize activity precautions or restrictions will improve Outcome: Progressing Goal: Knowledge of the prescribed therapeutic regimen will improve Outcome: Progressing Goal: Understanding of discharge needs will improve Outcome: Progressing   Problem: Activity: Goal: Ability to avoid complications of mobility impairment will improve Outcome: Progressing Goal: Ability to tolerate increased activity will improve Outcome: Progressing Goal: Will remain free from falls Outcome: Progressing   Problem: Bowel/Gastric: Goal: Gastrointestinal status for postoperative course will improve Outcome: Progressing   Problem: Clinical Measurements: Goal: Ability to maintain clinical measurements within normal limits will improve Outcome: Progressing Goal: Postoperative complications will be avoided or minimized Outcome: Progressing Goal: Diagnostic test results will improve Outcome: Progressing   Problem: Pain Management: Goal: Pain level will decrease Outcome: Progressing

## 2023-10-17 NOTE — Progress Notes (Signed)
 PROGRESS NOTE    Christian Bailey  ZOX:[id number] DOB: 08-Dec-1940 DOA: 10/02/2023 PCP: Lonzie Robins, MD   Brief Narrative:  83 year old with history of peripheral artery disease on Plavix  and Crestor , right bundle branch block, chronic diastolic heart failure, CKD stage IV, ambulatory dysfunction, hypertension,  hyperlipidemia,  type 2 diabetes, chronic anxiety and depression and GERD presented to the emergency room with a fall.  Patient reportedly with worsening mobility since last 6 months.  In the emergency room hemodynamically stable.  Imaging consistent with posterior spinous ligament injury from C2-C5.  Difficult to tolerate pain medications and getting delirious with narcotics.  Followed by neurosurgery.  Also with leukocytosis. Patient developed severe agitation and hyper active delirium.  4/14 Encephalopathy resolving 4/15 Cervical decompression with Neurosurgery, [place] - tolerated well 4/18 Patient clinically improving.  Waiting to go to CIR. Clinically stable for discharge.   Assessment & Plan:   Principal Problem:   Fall Active Problems:   Intractable back pain   Delirium due to another medical condition, acute, hyperactive  Acute metabolic encephalopathy, multifactorial, resolved History of hospital delirium/sundowning Likely secondary to polypharmacy, hospital-acquired delirium and narcotics Had developed severe delirium and hallucination in the interim but now resolved, back to baseline. Responded well to haloperidol  and Seroquel . Tylenol  1 g every 6 hours around-the-clock.  Baclofen  tolerated well.   Trauma, cervical myelopathy: Followed by neurosurgery.   Successful cervical spine decompression and stabilization 4/15. Judicious use of narcotics given above Steroid taper ongoing Continue PT OT and referred to CIR. Clinically improved.   Acute intractable pain Peripheral arterial disease Continue Cymbalta , Tylenol  1 g every 6 hours, Lyrica  50 mg twice daily,  Flexeril  5 mg 3 times daily.  Bowel regimen.    CKD stage IV: At about baseline.   Anxiety depression: On Cymbalta .   Leukocytosis: Patient presented with WBC count of 19,000.  Urine was clear.  No other clear source of infection.  Treated with Rocephin  and azithromycin . WBC already trending down.   Procalcitonin 5.5.  WC count trending down. Cultures negative so far.  Normalized.  Completed antibiotics.   Essential hypertension: Blood pressure stable.  Holding lisinopril .  On Toprol -XL.   Type 2 diabetes: Known A1c 8.  On sliding scale.   Acute urinary retention: With altered mentation, he had to have multiple In-N-Out cath.  Currently has a Foley catheter.  Voiding trial today.   Dysphagia: Chronic dysphagia and along with retropharyngeal edema from cervical spine surgery.  Improving.  As discussed with neurosurgery, prednisone  taper prescribed to help with edema. Needs aspiration precautions.  DVT prophylaxis: SCD's Start: 10/12/23 1208 SCDs Start: 10/02/23 2244   Code Status:   Code Status: Limited: Do not attempt resuscitation (DNR) -DNR-LIMITED -Do Not Intubate/DNI  Family Communication: Wife at bedside  Status is: Inpatient  Dispo: The patient is from: Home              Anticipated d/c is to: CIR              Anticipated d/c date is: Imminent              Patient currently is medically stable for discharge  Consultants:  Neurosurgery  Procedures:  C-spine decompression and stabilization 4/15  Antimicrobials:  Perioperatively  Subjective: No acute issues or events overnight denies nausea vomiting diarrhea constipation any fevers or chest pain  Objective: Vitals:   10/16/23 0726 10/16/23 1506 10/16/23 2027 10/17/23 0508  BP: (!) 161/77 (!) 99/59 (!) 105/54 (!) 114/58  Pulse: 96  82 94 93  Resp: 17 16 17 18   Temp: 97.6 F (36.4 C) 97.7 F (36.5 C) 97.6 F (36.4 C) 97.8 F (36.6 C)  TempSrc: Oral Oral  Oral  SpO2: 100% 100% 99% 98%  Weight:      Height:         Intake/Output Summary (Last 24 hours) at 10/17/2023 0720 Last data filed at 10/16/2023 1800 Gross per 24 hour  Intake 720 ml  Output 500 ml  Net 220 ml   Filed Weights   10/12/23 0638 10/13/23 0545 10/16/23 0511  Weight: 65.3 kg 65.3 kg 60.8 kg    Examination:  General:  Pleasantly resting in bed, No acute distress. Neck: Dressing clean/dry/intact; c-collar in place Lungs:  Clear to auscultate bilaterally without rhonchi, wheeze, or rales. Heart:  Regular rate and rhythm.  Without murmurs, rubs, or gallops. Abdomen:  Soft, nontender, nondistended.  Without guarding or rebound. Extremities: Without cyanosis, clubbing, or obvious deformity. Skin:  Warm and dry, no erythema.  Data Reviewed: I have personally reviewed following labs and imaging studies  CBC: Recent Labs  Lab 10/16/23 0615  WBC 10.0  NEUTROABS 8.6*  HGB 9.4*  HCT 28.9*  MCV 88.9  PLT 429*   Basic Metabolic Panel: Recent Labs  Lab 10/16/23 0615  NA 140  K 4.2  CL 107  CO2 22  GLUCOSE 222*  BUN 44*  CREATININE 1.68*  CALCIUM  8.8*  MG 2.0  PHOS 3.5    GFR: Estimated Creatinine Clearance: 29.2 mL/min (A) (by C-G formula based on SCr of 1.68 mg/dL (H)).  CBG: Recent Labs  Lab 10/16/23 0600 10/16/23 1127 10/16/23 1611 10/16/23 2127 10/17/23 0605  GLUCAP 225* 152* 199* 283* 231*    Recent Results (from the past 240 hours)  Surgical pcr screen     Status: None   Collection Time: 10/12/23  3:24 AM   Specimen: Nasal Mucosa; Nasal Swab  Result Value Ref Range Status   MRSA, PCR NEGATIVE NEGATIVE Final   Staphylococcus aureus NEGATIVE NEGATIVE Final    Comment: (NOTE) The Xpert SA Assay (FDA approved for NASAL specimens in patients 28 years of age and older), is one component of a comprehensive surveillance program. It is not intended to diagnose infection nor to guide or monitor treatment. Performed at [facility], [address] [place], [place] [address]           Radiology Studies: No results found.       Scheduled Meds:  acetaminophen   1,000 mg Oral Q6H   Chlorhexidine  Gluconate Cloth  6 each Topical Daily   docusate sodium   100 mg Oral BID   DULoxetine   20 mg Oral Daily   feeding supplement  237 mL Oral BID BM   ferrous sulfate   325 mg Oral Q breakfast   insulin  aspart  0-5 Units Subcutaneous QHS   insulin  aspart  0-9 Units Subcutaneous TID WC   metoprolol  succinate  50 mg Oral Daily   mirabegron  ER  50 mg Oral Daily   pantoprazole   40 mg Oral BID   predniSONE   20 mg Oral BID WC   Followed by   predniSONE   10 mg Oral BID WC   Followed by   Cecily Cohen ON 10/21/2023] predniSONE   10 mg Oral Q breakfast   pregabalin   50 mg Oral BID   rosuvastatin   20 mg Oral QPM   senna  1 tablet Oral BID   sodium chloride  flush  3 mL Intravenous Q12H  Continuous Infusions:   LOS: 14 days   Time spent:  Haydee Lipa, DO [facility]  If 7PM-7AM, please contact night-coverage [email or web address]  10/17/2023, 7:20 AM

## 2023-10-18 ENCOUNTER — Inpatient Hospital Stay (HOSPITAL_COMMUNITY)

## 2023-10-18 ENCOUNTER — Other Ambulatory Visit: Payer: Self-pay

## 2023-10-18 ENCOUNTER — Encounter (HOSPITAL_COMMUNITY): Payer: Self-pay | Admitting: Physical Medicine and Rehabilitation

## 2023-10-18 ENCOUNTER — Other Ambulatory Visit (HOSPITAL_COMMUNITY): Payer: Self-pay

## 2023-10-18 ENCOUNTER — Inpatient Hospital Stay (HOSPITAL_COMMUNITY)
Admission: AD | Admit: 2023-10-18 | Discharge: 2023-11-24 | DRG: 559 | Disposition: A | Discharge status: Skilled Nursing Facility | Source: Intra-hospital | Attending: Physical Medicine and Rehabilitation | Admitting: Physical Medicine and Rehabilitation

## 2023-10-18 DIAGNOSIS — M4712 Other spondylosis with myelopathy, cervical region: Secondary | ICD-10-CM | POA: Diagnosis not present

## 2023-10-18 DIAGNOSIS — Z885 Allergy status to narcotic agent status: Secondary | ICD-10-CM

## 2023-10-18 DIAGNOSIS — L89896 Pressure-induced deep tissue damage of other site: Secondary | ICD-10-CM | POA: Insufficient documentation

## 2023-10-18 DIAGNOSIS — Z7902 Long term (current) use of antithrombotics/antiplatelets: Secondary | ICD-10-CM

## 2023-10-18 DIAGNOSIS — S3730XA Unspecified injury of urethra, initial encounter: Secondary | ICD-10-CM | POA: Diagnosis not present

## 2023-10-18 DIAGNOSIS — Z794 Long term (current) use of insulin: Secondary | ICD-10-CM

## 2023-10-18 DIAGNOSIS — R29898 Other symptoms and signs involving the musculoskeletal system: Secondary | ICD-10-CM | POA: Diagnosis not present

## 2023-10-18 DIAGNOSIS — L89152 Pressure ulcer of sacral region, stage 2: Secondary | ICD-10-CM | POA: Diagnosis present

## 2023-10-18 DIAGNOSIS — M24511 Contracture, right shoulder: Secondary | ICD-10-CM | POA: Diagnosis not present

## 2023-10-18 DIAGNOSIS — K567 Ileus, unspecified: Secondary | ICD-10-CM | POA: Diagnosis not present

## 2023-10-18 DIAGNOSIS — K592 Neurogenic bowel, not elsewhere classified: Secondary | ICD-10-CM | POA: Diagnosis present

## 2023-10-18 DIAGNOSIS — M542 Cervicalgia: Secondary | ICD-10-CM

## 2023-10-18 DIAGNOSIS — F4323 Adjustment disorder with mixed anxiety and depressed mood: Secondary | ICD-10-CM | POA: Diagnosis not present

## 2023-10-18 DIAGNOSIS — N1832 Chronic kidney disease, stage 3b: Secondary | ICD-10-CM | POA: Diagnosis present

## 2023-10-18 DIAGNOSIS — W19XXXA Unspecified fall, initial encounter: Secondary | ICD-10-CM | POA: Diagnosis not present

## 2023-10-18 DIAGNOSIS — F32A Depression, unspecified: Secondary | ICD-10-CM | POA: Diagnosis present

## 2023-10-18 DIAGNOSIS — W2201XD Walked into wall, subsequent encounter: Secondary | ICD-10-CM | POA: Diagnosis not present

## 2023-10-18 DIAGNOSIS — R0982 Postnasal drip: Secondary | ICD-10-CM | POA: Diagnosis not present

## 2023-10-18 DIAGNOSIS — G8254 Quadriplegia, C5-C7 incomplete: Secondary | ICD-10-CM | POA: Diagnosis present

## 2023-10-18 DIAGNOSIS — I959 Hypotension, unspecified: Secondary | ICD-10-CM | POA: Diagnosis not present

## 2023-10-18 DIAGNOSIS — Z79899 Other long term (current) drug therapy: Secondary | ICD-10-CM

## 2023-10-18 DIAGNOSIS — R252 Cramp and spasm: Secondary | ICD-10-CM | POA: Diagnosis not present

## 2023-10-18 DIAGNOSIS — E43 Unspecified severe protein-calorie malnutrition: Secondary | ICD-10-CM | POA: Diagnosis present

## 2023-10-18 DIAGNOSIS — Z87891 Personal history of nicotine dependence: Secondary | ICD-10-CM | POA: Diagnosis not present

## 2023-10-18 DIAGNOSIS — R319 Hematuria, unspecified: Secondary | ICD-10-CM | POA: Diagnosis not present

## 2023-10-18 DIAGNOSIS — E1142 Type 2 diabetes mellitus with diabetic polyneuropathy: Secondary | ICD-10-CM | POA: Diagnosis present

## 2023-10-18 DIAGNOSIS — Z8701 Personal history of pneumonia (recurrent): Secondary | ICD-10-CM

## 2023-10-18 DIAGNOSIS — N179 Acute kidney failure, unspecified: Secondary | ICD-10-CM | POA: Diagnosis present

## 2023-10-18 DIAGNOSIS — Z981 Arthrodesis status: Secondary | ICD-10-CM

## 2023-10-18 DIAGNOSIS — Z7401 Bed confinement status: Secondary | ICD-10-CM | POA: Diagnosis not present

## 2023-10-18 DIAGNOSIS — M1990 Unspecified osteoarthritis, unspecified site: Secondary | ICD-10-CM | POA: Diagnosis present

## 2023-10-18 DIAGNOSIS — K219 Gastro-esophageal reflux disease without esophagitis: Secondary | ICD-10-CM | POA: Diagnosis not present

## 2023-10-18 DIAGNOSIS — R110 Nausea: Secondary | ICD-10-CM | POA: Diagnosis not present

## 2023-10-18 DIAGNOSIS — E1122 Type 2 diabetes mellitus with diabetic chronic kidney disease: Secondary | ICD-10-CM | POA: Diagnosis present

## 2023-10-18 DIAGNOSIS — F54 Psychological and behavioral factors associated with disorders or diseases classified elsewhere: Secondary | ICD-10-CM

## 2023-10-18 DIAGNOSIS — N319 Neuromuscular dysfunction of bladder, unspecified: Secondary | ICD-10-CM | POA: Diagnosis present

## 2023-10-18 DIAGNOSIS — I10 Essential (primary) hypertension: Secondary | ICD-10-CM | POA: Diagnosis present

## 2023-10-18 DIAGNOSIS — Z66 Do not resuscitate: Secondary | ICD-10-CM | POA: Diagnosis present

## 2023-10-18 DIAGNOSIS — M7501 Adhesive capsulitis of right shoulder: Secondary | ICD-10-CM | POA: Diagnosis present

## 2023-10-18 DIAGNOSIS — F418 Other specified anxiety disorders: Secondary | ICD-10-CM

## 2023-10-18 DIAGNOSIS — G8252 Quadriplegia, C1-C4 incomplete: Secondary | ICD-10-CM | POA: Diagnosis not present

## 2023-10-18 DIAGNOSIS — R2689 Other abnormalities of gait and mobility: Secondary | ICD-10-CM | POA: Diagnosis not present

## 2023-10-18 DIAGNOSIS — Z4789 Encounter for other orthopedic aftercare: Principal | ICD-10-CM

## 2023-10-18 DIAGNOSIS — L89306 Pressure-induced deep tissue damage of unspecified buttock: Secondary | ICD-10-CM | POA: Insufficient documentation

## 2023-10-18 DIAGNOSIS — R296 Repeated falls: Secondary | ICD-10-CM | POA: Diagnosis present

## 2023-10-18 DIAGNOSIS — I951 Orthostatic hypotension: Secondary | ICD-10-CM | POA: Diagnosis present

## 2023-10-18 DIAGNOSIS — I452 Bifascicular block: Secondary | ICD-10-CM | POA: Diagnosis present

## 2023-10-18 DIAGNOSIS — R4182 Altered mental status, unspecified: Secondary | ICD-10-CM | POA: Diagnosis not present

## 2023-10-18 DIAGNOSIS — Z91041 Radiographic dye allergy status: Secondary | ICD-10-CM

## 2023-10-18 DIAGNOSIS — H3530 Unspecified macular degeneration: Secondary | ICD-10-CM | POA: Diagnosis present

## 2023-10-18 DIAGNOSIS — Z96611 Presence of right artificial shoulder joint: Secondary | ICD-10-CM | POA: Diagnosis present

## 2023-10-18 DIAGNOSIS — Y846 Urinary catheterization as the cause of abnormal reaction of the patient, or of later complication, without mention of misadventure at the time of the procedure: Secondary | ICD-10-CM | POA: Diagnosis not present

## 2023-10-18 DIAGNOSIS — Z888 Allergy status to other drugs, medicaments and biological substances status: Secondary | ICD-10-CM

## 2023-10-18 DIAGNOSIS — G8250 Quadriplegia, unspecified: Secondary | ICD-10-CM | POA: Diagnosis not present

## 2023-10-18 DIAGNOSIS — R2681 Unsteadiness on feet: Secondary | ICD-10-CM

## 2023-10-18 DIAGNOSIS — G9520 Unspecified cord compression: Secondary | ICD-10-CM | POA: Diagnosis not present

## 2023-10-18 DIAGNOSIS — Z887 Allergy status to serum and vaccine status: Secondary | ICD-10-CM

## 2023-10-18 DIAGNOSIS — Z6822 Body mass index (BMI) 22.0-22.9, adult: Secondary | ICD-10-CM

## 2023-10-18 DIAGNOSIS — M6281 Muscle weakness (generalized): Secondary | ICD-10-CM | POA: Diagnosis not present

## 2023-10-18 DIAGNOSIS — F05 Delirium due to known physiological condition: Secondary | ICD-10-CM | POA: Diagnosis not present

## 2023-10-18 DIAGNOSIS — G959 Disease of spinal cord, unspecified: Principal | ICD-10-CM | POA: Diagnosis present

## 2023-10-18 DIAGNOSIS — N183 Chronic kidney disease, stage 3 unspecified: Secondary | ICD-10-CM | POA: Diagnosis not present

## 2023-10-18 DIAGNOSIS — E1151 Type 2 diabetes mellitus with diabetic peripheral angiopathy without gangrene: Secondary | ICD-10-CM | POA: Diagnosis present

## 2023-10-18 DIAGNOSIS — J9811 Atelectasis: Secondary | ICD-10-CM | POA: Diagnosis present

## 2023-10-18 DIAGNOSIS — R278 Other lack of coordination: Secondary | ICD-10-CM | POA: Diagnosis not present

## 2023-10-18 DIAGNOSIS — R0683 Snoring: Secondary | ICD-10-CM | POA: Diagnosis present

## 2023-10-18 DIAGNOSIS — Z7984 Long term (current) use of oral hypoglycemic drugs: Secondary | ICD-10-CM

## 2023-10-18 DIAGNOSIS — W19XXXD Unspecified fall, subsequent encounter: Secondary | ICD-10-CM | POA: Diagnosis present

## 2023-10-18 DIAGNOSIS — N368 Other specified disorders of urethra: Secondary | ICD-10-CM | POA: Diagnosis present

## 2023-10-18 DIAGNOSIS — R339 Retention of urine, unspecified: Secondary | ICD-10-CM | POA: Diagnosis not present

## 2023-10-18 DIAGNOSIS — Z8249 Family history of ischemic heart disease and other diseases of the circulatory system: Secondary | ICD-10-CM

## 2023-10-18 DIAGNOSIS — Z886 Allergy status to analgesic agent status: Secondary | ICD-10-CM

## 2023-10-18 DIAGNOSIS — Z833 Family history of diabetes mellitus: Secondary | ICD-10-CM

## 2023-10-18 DIAGNOSIS — Z860100 Personal history of colon polyps, unspecified: Secondary | ICD-10-CM

## 2023-10-18 DIAGNOSIS — T380X5A Adverse effect of glucocorticoids and synthetic analogues, initial encounter: Secondary | ICD-10-CM | POA: Diagnosis present

## 2023-10-18 DIAGNOSIS — M62838 Other muscle spasm: Secondary | ICD-10-CM | POA: Diagnosis present

## 2023-10-18 DIAGNOSIS — F419 Anxiety disorder, unspecified: Secondary | ICD-10-CM | POA: Diagnosis present

## 2023-10-18 DIAGNOSIS — M549 Dorsalgia, unspecified: Secondary | ICD-10-CM | POA: Diagnosis not present

## 2023-10-18 DIAGNOSIS — L89309 Pressure ulcer of unspecified buttock, unspecified stage: Secondary | ICD-10-CM | POA: Diagnosis not present

## 2023-10-18 DIAGNOSIS — K5900 Constipation, unspecified: Secondary | ICD-10-CM | POA: Diagnosis present

## 2023-10-18 DIAGNOSIS — R739 Hyperglycemia, unspecified: Secondary | ICD-10-CM | POA: Diagnosis not present

## 2023-10-18 DIAGNOSIS — R7989 Other specified abnormal findings of blood chemistry: Secondary | ICD-10-CM | POA: Diagnosis present

## 2023-10-18 DIAGNOSIS — I129 Hypertensive chronic kidney disease with stage 1 through stage 4 chronic kidney disease, or unspecified chronic kidney disease: Secondary | ICD-10-CM | POA: Diagnosis present

## 2023-10-18 DIAGNOSIS — R0681 Apnea, not elsewhere classified: Secondary | ICD-10-CM | POA: Diagnosis present

## 2023-10-18 DIAGNOSIS — E785 Hyperlipidemia, unspecified: Secondary | ICD-10-CM | POA: Diagnosis present

## 2023-10-18 DIAGNOSIS — L89616 Pressure-induced deep tissue damage of right heel: Secondary | ICD-10-CM | POA: Diagnosis present

## 2023-10-18 DIAGNOSIS — E119 Type 2 diabetes mellitus without complications: Secondary | ICD-10-CM | POA: Diagnosis not present

## 2023-10-18 DIAGNOSIS — R000 Tachycardia, unspecified: Secondary | ICD-10-CM | POA: Diagnosis not present

## 2023-10-18 LAB — GLUCOSE, CAPILLARY
Glucose-Capillary: 155 mg/dL — ABNORMAL HIGH (ref 70–99)
Glucose-Capillary: 114 mg/dL — ABNORMAL HIGH (ref 70–99)
Glucose-Capillary: 182 mg/dL — ABNORMAL HIGH (ref 70–99)
Glucose-Capillary: 302 mg/dL — ABNORMAL HIGH (ref 70–99)
Glucose-Capillary: 182 mg/dL — ABNORMAL HIGH (ref 70–99)

## 2023-10-18 MED ORDER — PHENOL 1.4 % MT LIQD: 1.0000 | OROMUCOSAL | Status: DC | PRN

## 2023-10-18 MED ORDER — MELATONIN 5 MG PO TABS
5.0000 mg | ORAL_TABLET | Freq: Every evening | ORAL | Status: DC | PRN
  Administered 2023-10-19 – 2023-11-03 (×7): 5 mg via ORAL
  Filled 2023-10-18 (×8): qty 1

## 2023-10-18 MED ORDER — DULOXETINE HCL 20 MG PO CPEP
20.0000 mg | ORAL_CAPSULE | Freq: Every day | ORAL | 0 refills | Status: DC
  Filled 2023-10-18: qty 90, 90d supply, fill #0

## 2023-10-18 MED ORDER — INSULIN ASPART 100 UNIT/ML IJ SOLN: 0.0000 [IU] | Freq: Every day | INTRAMUSCULAR | Status: DC

## 2023-10-18 MED ORDER — FLEET ENEMA RE ENEM: 1.0000 | ENEMA | Freq: Once | RECTAL | Status: DC | PRN

## 2023-10-18 MED ORDER — SENNOSIDES-DOCUSATE SODIUM 8.6-50 MG PO TABS
2.0000 | ORAL_TABLET | Freq: Every day | ORAL | Status: DC
  Administered 2023-10-19 – 2023-11-24 (×37): 2 via ORAL
  Filled 2023-10-18 (×37): qty 2

## 2023-10-18 MED ORDER — INSULIN ASPART 100 UNIT/ML IJ SOLN: 0.0000 [IU] | Freq: Three times a day (TID) | INTRAMUSCULAR | Status: DC

## 2023-10-18 MED ORDER — METOPROLOL SUCCINATE ER 50 MG PO TB24
50.0000 mg | ORAL_TABLET | Freq: Every day | ORAL | Status: DC
  Administered 2023-10-19 – 2023-10-26 (×8): 50 mg via ORAL
  Filled 2023-10-18 (×8): qty 1

## 2023-10-18 MED ORDER — LIDOCAINE HCL URETHRAL/MUCOSAL 2 % EX GEL
CUTANEOUS | Status: DC | PRN
  Administered 2023-10-21 – 2023-11-24 (×2): 1 via TOPICAL
  Filled 2023-10-18 (×2): qty 6

## 2023-10-18 MED ORDER — CYCLOBENZAPRINE HCL 5 MG PO TABS
5.0000 mg | ORAL_TABLET | Freq: Three times a day (TID) | ORAL | 0 refills | Status: DC | PRN
  Filled 2023-10-18: qty 30, 10d supply, fill #0

## 2023-10-18 MED ORDER — PREDNISONE 10 MG PO TABS
10.0000 mg | ORAL_TABLET | Freq: Two times a day (BID) | ORAL | Status: AC
  Administered 2023-10-18 – 2023-10-20 (×4): 10 mg via ORAL
  Filled 2023-10-18 (×4): qty 1

## 2023-10-18 MED ORDER — INSULIN ASPART 100 UNIT/ML IJ SOLN
0.0000 [IU] | Freq: Every day | INTRAMUSCULAR | Status: DC
  Administered 2023-10-19: 2 [IU] via SUBCUTANEOUS
  Administered 2023-10-20 – 2023-10-23 (×4): 3 [IU] via SUBCUTANEOUS
  Administered 2023-10-24 – 2023-11-04 (×4): 2 [IU] via SUBCUTANEOUS
  Administered 2023-11-09: 3 [IU] via SUBCUTANEOUS
  Administered 2023-11-11: 2 [IU] via SUBCUTANEOUS
  Administered 2023-11-12: 3 [IU] via SUBCUTANEOUS

## 2023-10-18 MED ORDER — DIPHENHYDRAMINE HCL 25 MG PO CAPS
25.0000 mg | ORAL_CAPSULE | Freq: Four times a day (QID) | ORAL | Status: DC | PRN
  Administered 2023-10-18 – 2023-10-28 (×3): 25 mg via ORAL
  Filled 2023-10-18 (×3): qty 1

## 2023-10-18 MED ORDER — BISACODYL 10 MG RE SUPP
10.0000 mg | Freq: Every day | RECTAL | Status: DC | PRN
  Administered 2023-10-19: 10 mg via RECTAL
  Filled 2023-10-18: qty 1

## 2023-10-18 MED ORDER — MIRABEGRON ER 25 MG PO TB24: 50.0000 mg | ORAL_TABLET | Freq: Every day | ORAL | Status: DC

## 2023-10-18 MED ORDER — POLYETHYLENE GLYCOL 3350 17 G PO PACK
32.0000 g | PACK | Freq: Every day | ORAL | Status: DC
  Administered 2023-10-18 – 2023-10-24 (×7): 32 g via ORAL
  Filled 2023-10-18 (×7): qty 2

## 2023-10-18 MED ORDER — PROSOURCE PLUS PO LIQD
30.0000 mL | Freq: Two times a day (BID) | ORAL | Status: DC
  Administered 2023-10-19 – 2023-10-29 (×19): 30 mL via ORAL
  Filled 2023-10-18 (×19): qty 30

## 2023-10-18 MED ORDER — PROCHLORPERAZINE 25 MG RE SUPP: 12.5000 mg | Freq: Four times a day (QID) | RECTAL | Status: DC | PRN

## 2023-10-18 MED ORDER — DICLOFENAC SODIUM 1 % EX GEL
2.0000 g | Freq: Four times a day (QID) | CUTANEOUS | Status: DC
  Administered 2023-10-19 – 2023-11-23 (×114): 2 g via TOPICAL
  Filled 2023-10-18: qty 100

## 2023-10-18 MED ORDER — GUAIFENESIN-DM 100-10 MG/5ML PO SYRP: 5.0000 mL | ORAL_SOLUTION | Freq: Four times a day (QID) | ORAL | Status: DC | PRN

## 2023-10-18 MED ORDER — ENSURE MAX PROTEIN PO LIQD
11.0000 [oz_av] | Freq: Two times a day (BID) | ORAL | Status: DC
  Administered 2023-10-19 – 2023-10-28 (×14): 11 [oz_av] via ORAL

## 2023-10-18 MED ORDER — ALUM & MAG HYDROXIDE-SIMETH 200-200-20 MG/5ML PO SUSP: 30.0000 mL | ORAL | Status: DC | PRN

## 2023-10-18 MED ORDER — PREGABALIN 50 MG PO CAPS
50.0000 mg | ORAL_CAPSULE | Freq: Two times a day (BID) | ORAL | Status: DC
  Administered 2023-10-18 – 2023-11-24 (×74): 50 mg via ORAL
  Filled 2023-10-18 (×74): qty 1

## 2023-10-18 MED ORDER — QUETIAPINE FUMARATE 25 MG PO TABS: 12.5000 mg | ORAL_TABLET | Freq: Three times a day (TID) | ORAL | Status: DC | PRN

## 2023-10-18 MED ORDER — INSULIN ASPART 100 UNIT/ML IJ SOLN
0.0000 [IU] | Freq: Three times a day (TID) | INTRAMUSCULAR | Status: DC
  Administered 2023-10-18: 7 [IU] via SUBCUTANEOUS
  Administered 2023-10-19: 2 [IU] via SUBCUTANEOUS
  Administered 2023-10-19: 5 [IU] via SUBCUTANEOUS
  Administered 2023-10-19: 3 [IU] via SUBCUTANEOUS
  Administered 2023-10-20: 1 [IU] via SUBCUTANEOUS
  Administered 2023-10-20 (×2): 5 [IU] via SUBCUTANEOUS
  Administered 2023-10-21: 3 [IU] via SUBCUTANEOUS
  Administered 2023-10-21: 2 [IU] via SUBCUTANEOUS
  Administered 2023-10-21 – 2023-10-22 (×3): 5 [IU] via SUBCUTANEOUS
  Administered 2023-10-23: 2 [IU] via SUBCUTANEOUS
  Administered 2023-10-23: 5 [IU] via SUBCUTANEOUS
  Administered 2023-10-23: 2 [IU] via SUBCUTANEOUS
  Administered 2023-10-24 (×3): 3 [IU] via SUBCUTANEOUS
  Administered 2023-10-25: 2 [IU] via SUBCUTANEOUS
  Administered 2023-10-25: 1 [IU] via SUBCUTANEOUS
  Administered 2023-10-25: 9 [IU] via SUBCUTANEOUS
  Administered 2023-10-26 (×2): 2 [IU] via SUBCUTANEOUS
  Administered 2023-10-26: 3 [IU] via SUBCUTANEOUS
  Administered 2023-10-27 (×2): 1 [IU] via SUBCUTANEOUS
  Administered 2023-10-27 – 2023-10-28 (×3): 2 [IU] via SUBCUTANEOUS
  Administered 2023-10-28 – 2023-10-29 (×2): 1 [IU] via SUBCUTANEOUS
  Administered 2023-10-29 (×2): 2 [IU] via SUBCUTANEOUS
  Administered 2023-10-30: 3 [IU] via SUBCUTANEOUS
  Administered 2023-10-30: 1 [IU] via SUBCUTANEOUS
  Administered 2023-10-30: 5 [IU] via SUBCUTANEOUS
  Administered 2023-10-31: 1 [IU] via SUBCUTANEOUS
  Administered 2023-10-31: 5 [IU] via SUBCUTANEOUS
  Administered 2023-10-31: 2 [IU] via SUBCUTANEOUS
  Administered 2023-11-01: 1 [IU] via SUBCUTANEOUS
  Administered 2023-11-01: 2 [IU] via SUBCUTANEOUS
  Administered 2023-11-01: 3 [IU] via SUBCUTANEOUS
  Administered 2023-11-02: 1 [IU] via SUBCUTANEOUS
  Administered 2023-11-02: 2 [IU] via SUBCUTANEOUS
  Administered 2023-11-02: 3 [IU] via SUBCUTANEOUS
  Administered 2023-11-03: 2 [IU] via SUBCUTANEOUS
  Administered 2023-11-03: 1 [IU] via SUBCUTANEOUS
  Administered 2023-11-03: 3 [IU] via SUBCUTANEOUS
  Administered 2023-11-04 – 2023-11-05 (×4): 1 [IU] via SUBCUTANEOUS
  Administered 2023-11-05: 5 [IU] via SUBCUTANEOUS
  Administered 2023-11-05: 2 [IU] via SUBCUTANEOUS
  Administered 2023-11-06: 3 [IU] via SUBCUTANEOUS
  Administered 2023-11-06: 2 [IU] via SUBCUTANEOUS
  Administered 2023-11-06 – 2023-11-07 (×4): 3 [IU] via SUBCUTANEOUS
  Administered 2023-11-08: 2 [IU] via SUBCUTANEOUS
  Administered 2023-11-08: 1 [IU] via SUBCUTANEOUS
  Administered 2023-11-08: 3 [IU] via SUBCUTANEOUS
  Administered 2023-11-09: 2 [IU] via SUBCUTANEOUS
  Administered 2023-11-09: 1 [IU] via SUBCUTANEOUS
  Administered 2023-11-10: 5 [IU] via SUBCUTANEOUS
  Administered 2023-11-11: 3 [IU] via SUBCUTANEOUS
  Administered 2023-11-11: 1 [IU] via SUBCUTANEOUS
  Administered 2023-11-11: 3 [IU] via SUBCUTANEOUS
  Administered 2023-11-12: 1 [IU] via SUBCUTANEOUS
  Administered 2023-11-12 (×2): 3 [IU] via SUBCUTANEOUS
  Administered 2023-11-13: 1 [IU] via SUBCUTANEOUS
  Administered 2023-11-13 – 2023-11-15 (×7): 2 [IU] via SUBCUTANEOUS
  Administered 2023-11-15: 1 [IU] via SUBCUTANEOUS
  Administered 2023-11-16 (×2): 2 [IU] via SUBCUTANEOUS
  Administered 2023-11-17: 3 [IU] via SUBCUTANEOUS
  Administered 2023-11-17 – 2023-11-18 (×2): 2 [IU] via SUBCUTANEOUS
  Administered 2023-11-18: 1 [IU] via SUBCUTANEOUS
  Administered 2023-11-19 (×2): 2 [IU] via SUBCUTANEOUS
  Administered 2023-11-20: 1 [IU] via SUBCUTANEOUS
  Administered 2023-11-20: 2 [IU] via SUBCUTANEOUS
  Administered 2023-11-20: 1 [IU] via SUBCUTANEOUS
  Administered 2023-11-21: 2 [IU] via SUBCUTANEOUS
  Administered 2023-11-22 (×2): 1 [IU] via SUBCUTANEOUS
  Administered 2023-11-22 – 2023-11-23 (×3): 2 [IU] via SUBCUTANEOUS

## 2023-10-18 MED ORDER — PROCHLORPERAZINE EDISYLATE 10 MG/2ML IJ SOLN: 5.0000 mg | Freq: Four times a day (QID) | INTRAMUSCULAR | Status: DC | PRN

## 2023-10-18 MED ORDER — JUVEN PO PACK
1.0000 | PACK | Freq: Two times a day (BID) | ORAL | Status: DC
  Administered 2023-10-19 – 2023-10-29 (×18): 1 via ORAL
  Filled 2023-10-18 (×18): qty 1

## 2023-10-18 MED ORDER — PANTOPRAZOLE SODIUM 40 MG PO TBEC
40.0000 mg | DELAYED_RELEASE_TABLET | Freq: Two times a day (BID) | ORAL | Status: DC
  Administered 2023-10-18 – 2023-11-24 (×74): 40 mg via ORAL
  Filled 2023-10-18 (×74): qty 1

## 2023-10-18 MED ORDER — CYCLOBENZAPRINE HCL 5 MG PO TABS
5.0000 mg | ORAL_TABLET | Freq: Three times a day (TID) | ORAL | Status: DC | PRN
Start: 2023-10-18 — End: 2023-10-26
  Administered 2023-10-18 – 2023-10-24 (×4): 5 mg via ORAL
  Filled 2023-10-18 (×4): qty 1

## 2023-10-18 MED ORDER — ACETAMINOPHEN 325 MG PO TABS
650.0000 mg | ORAL_TABLET | ORAL | 0 refills | Status: DC | PRN
  Filled 2023-10-18: qty 30, 3d supply, fill #0

## 2023-10-18 MED ORDER — FERROUS SULFATE 325 (65 FE) MG PO TABS
325.0000 mg | ORAL_TABLET | Freq: Every day | ORAL | Status: DC
  Administered 2023-10-19 – 2023-11-08 (×21): 325 mg via ORAL
  Filled 2023-10-18 (×21): qty 1

## 2023-10-18 MED ORDER — MENTHOL 3 MG MT LOZG
1.0000 | LOZENGE | OROMUCOSAL | Status: DC | PRN
Start: 2023-10-18 — End: 2023-11-24

## 2023-10-18 MED ORDER — PREGABALIN 50 MG PO CAPS
50.0000 mg | ORAL_CAPSULE | Freq: Two times a day (BID) | ORAL | 0 refills | Status: DC
  Filled 2023-10-18: qty 60, 30d supply, fill #0

## 2023-10-18 MED ORDER — DULOXETINE HCL 20 MG PO CPEP
20.0000 mg | ORAL_CAPSULE | Freq: Every day | ORAL | Status: DC
  Administered 2023-10-19 – 2023-11-24 (×37): 20 mg via ORAL
  Filled 2023-10-18 (×38): qty 1

## 2023-10-18 MED ORDER — ROSUVASTATIN CALCIUM 20 MG PO TABS
20.0000 mg | ORAL_TABLET | Freq: Every evening | ORAL | Status: DC
  Administered 2023-10-18 – 2023-11-23 (×37): 20 mg via ORAL
  Filled 2023-10-18 (×37): qty 1

## 2023-10-18 MED ORDER — PREDNISONE 10 MG PO TABS
10.0000 mg | ORAL_TABLET | Freq: Every day | ORAL | Status: AC
  Administered 2023-10-21 – 2023-10-23 (×3): 10 mg via ORAL
  Filled 2023-10-18 (×3): qty 1

## 2023-10-18 MED ORDER — ACETAMINOPHEN 325 MG PO TABS
325.0000 mg | ORAL_TABLET | ORAL | Status: DC | PRN
  Administered 2023-10-21 – 2023-11-14 (×19): 650 mg via ORAL
  Administered 2023-11-15: 325 mg via ORAL
  Administered 2023-11-16 – 2023-11-24 (×14): 650 mg via ORAL
  Filled 2023-10-18 (×35): qty 2

## 2023-10-18 MED ORDER — METOPROLOL SUCCINATE ER 50 MG PO TB24
50.0000 mg | ORAL_TABLET | Freq: Every day | ORAL | 0 refills | Status: DC
  Filled 2023-10-18: qty 90, 90d supply, fill #0

## 2023-10-18 MED ORDER — PROCHLORPERAZINE MALEATE 5 MG PO TABS
5.0000 mg | ORAL_TABLET | Freq: Four times a day (QID) | ORAL | Status: DC | PRN
  Administered 2023-11-05: 10 mg via ORAL
  Filled 2023-10-18: qty 2

## 2023-10-18 NOTE — Progress Notes (Incomplete)
 Inpatient Rehabilitation Admission Medication Review by a Pharmacist  A complete drug regimen review was completed for this patient to identify any potential clinically significant medication issues.  High Risk Drug Classes Is patient taking? Indication by Medication  Antipsychotic Yes, as an intravenous medication Haldol  PRN- agitation   Anticoagulant No   Antibiotic No   Opioid No   Antiplatelet No   Hypoglycemics/insulin  Yes Insulin  - DM   Vasoactive Medication Yes Toprol - HF   Chemotherapy No   Other Yes fleet enema , PEG , docusate , bisacodyl  , senna , and - constipation Maalox- indigestion Pantoprazole - reflux  Acetaminophen - pain  Zofran - n/v   melatonin and -insomnia Crestor - HLD  Prednisone - retropharyngeal edema  Myrbetriq - urinary incontinence Lyrica - neuropathy PO iron- supplement  Duloxetine - mood Flexeril - muscle spasms        Type of Medication Issue Identified Description of Issue Recommendation(s)  Drug Interaction(s) (clinically significant)     Duplicate Therapy     Allergy     No Medication Administration End Date     Incorrect Dose     Additional Drug Therapy Needed     Significant med changes from prior encounter (inform family/care partners about these prior to discharge).  Communicate relevant medication changes to patient/family members at discharge from CIR.   Restart or discontinue PTA meds not resumed in CIR at discharge if clinically indicated.   Other  PTA meds not resumed: lisinopril  5, Jardiance, plavix  (stopped inpt by NSGY)      Clinically significant medication issues were identified that warrant physician communication and completion of prescribed/recommended actions by midnight of the next day:  No  Name of provider notified for urgent issues identified:   Provider Method of Notification:     Pharmacist comments:   Crestor  20 mg borderline for Crestor  dose adjustment to 10 mg given CrCl <30 mL/min. Given CKD would  recommend changing to atorvastatin 80 mg in lieu of dose-adjusting Crestor .   Time spent performing this drug regimen review (minutes):  20   Chrystie Crass, PharmD Clinical Pharmacist  10/18/2023 1:05 PM

## 2023-10-18 NOTE — Progress Notes (Signed)
 PMR Admission Coordinator Pre-Admission Assessment   Patient: Christian Bailey is an 83 y.o., male MRN: [id number] DOB: 1941-05-04 Height: 5\' 5"  (165.1 cm) Weight: 65 kg   Insurance Information HMO:     PPO:      PCP:      IPA:      80/20: yes     OTHER:  PRIMARY: Medicare A and B      Policy#: [id number]    Subscriber:  CM Name:       Phone#:      Fax#:  Pre-Cert#: verified Health and safety inspector:  Benefits:  Phone #:      Name:  Eff. Date: a and B 06/29/2006    Deduct: $1632      Out of Pocket Max: n/a      Life Max: n/a CIR: 100%      SNF: 20 full days Outpatient:      Co-Pay:  Home Health: 100%      Co-Pay:  DME:      Co-Pay:  Providers: in network SECONDARY: T[employer] for Life      Policy#: [id number]      Phone#:    Financial Counselor:       Phone#:    The "Data Collection Information Summary" for patients in Inpatient Rehabilitation Facilities with attached "Privacy Act Statement-Health Care Records" was provided and verbally reviewed with: Family   Emergency Contact Information Contact Information       Name Relation Home Work Mobile    Bailey,Christian Spouse [phone]   [phone]    Christian, Bailey [phone]   [phone]         Other Contacts   None on File        Current Medical History  Patient Admitting Diagnosis: Cervical Myelopathy History of Present Illness: Christian Bailey is a 83 y.o. male  with hx of dCHF, CKD4 with Cr ~ 2.2; HTN, HLD; DM with A1c of 8.0; anxiety and depression and frequent falls who was admitted to [facility] on 10/02/23 after another fall at home. He was found to have 5mm of canal at C5/6 and C6/7- with compression of Spinal cord And moderate foraminal stenosis.  Pt initially describes that legs will "go out" and he will fall- denies lightheadedness. In the emergency room, Temperature 97.5.  BP 148/73, pulse 101, respiration rate 20, O2 saturation of 100% on room air.  Lab studies notable for elevated creatinine above  baseline, BUN 42, creatinine 2.80 with GFR 24.  WBC 12.0.  Hemoglobin 10.5.  Platelet count 306.  Neutrophil count 9.4.  Chest x-ray showed no active disease.   Pt. With some hospital delirium that appears to be resolving. Delirium felt due to narcotics. Pt. Underwent Cervical decompression with Neurosurgery 10/12/23. Post operatively, PT/OT recommended CIR to assist return th PLOF. Palliative care also saw pt during this admission and pt. Expressed desire for aggressive rehab so that pt. Can return home with his wife.    Patient's medical record from [facility] has been reviewed by the rehabilitation admission coordinator and physician.   Past Medical History      Past Medical History:  Diagnosis Date   Anemia     Complication of anesthesia     Diabetes mellitus (HCC)      TYPE 2   GERD (gastroesophageal reflux disease)     History of blood transfusion      GI bleed   History of colon polyps  History of hiatal hernia     Hyperlipemia     Hypertension     Osteoarthritis     PAD (peripheral artery disease) (HCC)      a. stenting of his left common iliac artery >20 years ago. b. h/o LEIA stent and 2 stents to R SFA in 2011. c. 04/2014:  s/p PTA of right SFA for in-stent restenosis, occluded left SFA   PONV (postoperative nausea and vomiting)      no porblem with the last 3 surgeries   RBBB (right bundle branch block with left anterior fascicular block)      NUCLEAR STRESS TEST, 08/18/2010 - no significant wall motion abnoramlities noted, post-stress EF 69%, normal myocardial perfusion study   Sinus tachycardia      a. Noted during admission 04/2014 but upon review seems to be frequent finding for patient.   Stenosis of carotid artery      a. 50% right carotid stenosis by angiogram 04/2014.          Has the patient had major surgery during 100 days prior to admission? Yes   Family History   family history includes Alcoholism in his brother; Diabetes type II in his  brother; Esophageal cancer in his brother; Heart disease in his mother; Leukemia in his brother, brother, and mother; Liver disease in his brother; Lung disease in his sister; Stomach cancer in his father.   Current Medications  Current Medications    Current Facility-Administered Medications:    0.9 %  sodium chloride  infusion, , Intravenous, Continuous, Haydee Lipa, MD   acetaminophen  (TYLENOL ) tablet 650 mg, 650 mg, Oral, Q6H PRN, Bary Boss, DO, 650 mg at 10/05/23 0533   cyclobenzaprine  (FLEXERIL ) tablet 5 mg, 5 mg, Oral, TID PRN, Haydee Lipa, MD, 5 mg at 10/04/23 2240   DULoxetine  (CYMBALTA ) DR capsule 20 mg, 20 mg, Oral, Daily, Hall, Carole N, DO, 20 mg at 10/05/23 1610   feeding supplement (ENSURE ENLIVE / ENSURE PLUS) liquid 237 mL, 237 mL, Oral, BID BM, Haydee Lipa, MD, 237 mL at 10/05/23 0843   ferrous sulfate  tablet 325 mg, 325 mg, Oral, Q breakfast, Reesa Cannon N, DO, 325 mg at 10/05/23 9604   insulin  aspart (novoLOG ) injection 0-5 Units, 0-5 Units, Subcutaneous, QHS, Hall, Carole N, DO   insulin  aspart (novoLOG ) injection 0-9 Units, 0-9 Units, Subcutaneous, TID WC, Hall, Carole N, DO, 2 Units at 10/05/23 1217   melatonin tablet 5 mg, 5 mg, Oral, QHS PRN, Bary Boss, DO   mirabegron  ER (MYRBETRIQ ) tablet 50 mg, 50 mg, Oral, Daily, Haydee Lipa, MD, 50 mg at 10/05/23 5409   oxyCODONE  (Oxy IR/ROXICODONE ) immediate release tablet 2.5 mg, 2.5 mg, Oral, Q4H PRN, Haydee Lipa, MD, 2.5 mg at 10/04/23 8119   pantoprazole  (PROTONIX ) EC tablet 40 mg, 40 mg, Oral, BID, Reesa Cannon N, DO, 40 mg at 10/05/23 1478   polyethylene glycol (MIRALAX  / GLYCOLAX ) packet 17 g, 17 g, Oral, Daily PRN, [place], Carole N, DO   pregabalin  (LYRICA ) capsule 50 mg, 50 mg, Oral, BID, Haydee Lipa, MD, 50 mg at 10/05/23 2956   prochlorperazine  (COMPAZINE ) injection 5 mg, 5 mg, Intravenous, Q6H PRN, [place], Carole N, DO   rosuvastatin  (CRESTOR ) tablet 20 mg, 20  mg, Oral, QPM, Hall, Carole N, DO, 20 mg at 10/04/23 1722   traMADol  (ULTRAM ) tablet 50-100 mg, 50-100 mg, Oral, Q6H PRN, Haydee Lipa, MD, 50 mg at 10/05/23 [phone]  Patients Current Diet:  Diet Order                  Diet heart healthy/carb modified Room service appropriate? Yes; Fluid consistency: Thin  Diet effective now                         Precautions / Restrictions Precautions Precautions: Fall Cervical Brace: Soft collar Restrictions Weight Bearing Restrictions Per Provider Order: No    Has the patient had 2 or more falls or a fall with injury in the past year? Yes   Prior Activity Level   Prior Functional Level Self Care: Did the patient need help bathing, dressing, using the toilet or eating? Needed some help   Indoor Mobility: Did the patient need assistance with walking from room to room (with or without device)? Needed some help   Stairs: Did the patient need assistance with internal or external stairs (with or without device)? Needed some help   Functional Cognition: Did the patient need help planning regular tasks such as shopping or remembering to take medications? Needed some help   Patient Information Are you of Hispanic, Latino/a,or Spanish origin?: A. No, not of Hispanic, Latino/a, or Spanish origin What is your race?: A. White Do you need or want an interpreter to communicate with a doctor or health care staff?: 0. No   Patient's Response To:  Health Literacy and Transportation Is the patient able to respond to health literacy and transportation needs?: Yes Health Literacy - How often do you need to have someone help you when you read instructions, pamphlets, or other written material from your doctor or pharmacy?: Never In the past 12 months, has lack of transportation kept you from medical appointments or from getting medications?: No In the past 12 months, has lack of transportation kept you from meetings, work, or from getting things  needed for daily living?: No   Home Assistive Devices / Equipment Home Equipment: Agricultural consultant (2 wheels), Rollator (4 wheels), Shower seat - built in, [employer] equipment   Prior Device Use: Indicate devices/aids used by the patient prior to current illness, exacerbation or injury? Walker   Current Functional Level Cognition   Orientation Level: Oriented to person, Oriented to situation, Oriented to place, Disoriented to time    Extremity Assessment (includes Sensation/Coordination)   Upper Extremity Assessment: Generalized weakness  Lower Extremity Assessment: Defer to PT evaluation     ADLs   Overall ADL's : Needs assistance/impaired Eating/Feeding: Set up, Sitting Grooming: Wash/dry hands, Wash/dry face, Supervision/safety, Sitting Upper Body Bathing: Minimal assistance, Sitting Lower Body Bathing: Maximal assistance, Sitting/lateral leans Lower Body Bathing Details (indicate cue type and reason): simulated Upper Body Dressing : Minimal assistance, Sitting Lower Body Dressing: Maximal assistance, Total assistance, Sit to/from stand, Sitting/lateral leans Toilet Transfer: Moderate assistance, Rolling walker (2 wheels), Stand-pivot, BSC/3in1 Toilet Transfer Details (indicate cue type and reason): simulated with sit<>stand Toileting- Clothing Manipulation and Hygiene: Total assistance, Sitting/lateral lean, Sit to/from stand Toileting - Clothing Manipulation Details (indicate cue type and reason): unable to complete in standing Functional mobility during ADLs: Maximal assistance, Moderate assistance, +2 for physical assistance, Cueing for safety, Cueing for sequencing General ADL Comments: Prior to this admission, patient living with his wife, and able to walk with a RW. Patient endorses that he has required significantly more assist with all aspects of care lately. Currently patient  is +2 mod assist supine <> sit, and +2 min/HHA sit to stand. Patient with  poor sitting balance, and  unable to transition to steps despite increased assist. During transition sit to sidelying, pt reporting an 'electric shock' down BLE. In bed, pt only tolerating HOB up to 10 degrees. Patient is currently mod to max A for ADL management. Given current level of function, OT is recommending rehab <  3 hours prior to return to home. If patient is able to return home, patient would require increased DME (see below and PT recommendation). OT will continue to follow acutely.     Mobility   Overal bed mobility: Needs Assistance Bed Mobility: Rolling, Sidelying to Sit Rolling: Mod assist Sidelying to sit: +2 for physical assistance, Mod assist, Used rails Sit to sidelying: +2 for physical assistance, Mod assist General bed mobility comments: pt in chair     Transfers   Overall transfer level: Needs assistance Equipment used: Rolling walker (2 wheels) Transfers: Sit to/from Stand Sit to Stand: Mod assist Transfer via Lift Equipment: Stedy General transfer comment: bilateral support given at gait belt and shoulder girdle; stood x 5 from higher stedy seat, and stood x2 from lwer recliner weat     Ambulation / Gait / Stairs / Wheelchair Mobility   Ambulation/Gait Ambulation/Gait assistance: Mod assist, +2 physical assistance, +2 safety/equipment Gait Distance (Feet): 25 Feet Assistive device: Rolling walker (2 wheels) Gait Pattern/deviations: Step-through pattern, Decreased step length - right, Decreased step length - left, Ataxic General Gait Details: Unsteady throughout with heavy dependence on UE support; step width varying singificantly, with the need for near constant support; occasional siciisoring     Posture / Balance Dynamic Sitting Balance Sitting balance - Comments: frequent LOB posteriorly sitting EOB Balance Overall balance assessment: Needs assistance Sitting-balance support: Bilateral upper extremity supported, Feet supported Sitting balance-Leahy Scale: Poor Sitting balance -  Comments: frequent LOB posteriorly sitting EOB Postural control: Posterior lean Standing balance support: Bilateral upper extremity supported, During functional activity Standing balance-Leahy Scale: Poor Standing balance comment: static stand 2-person assist     Special needs/care consideration Skin surgical incision  and Special service needs none    Previous Home Environment (from acute therapy documentation) Living Arrangements: Spouse/significant other Available Help at Discharge: Family, Available 24 hours/day Type of Home: House Home Layout: One level Home Access: Stairs to enter Entrance Stairs-Rails: Left Entrance Stairs-Number of Steps: 3 Bathroom Shower/Tub: Health visitor: Handicapped height Bathroom Accessibility: Yes How Accessible: Accessible via walker Home Care Services: No   Discharge Living Setting Plans for Discharge Living Setting: Patient's home, House Type of Home at Discharge: House Discharge Home Layout: Two level, Able to live on main level with bedroom/bathroom Alternate Level Stairs-Rails: None Alternate Level Stairs-Number of Steps: flight Discharge Home Access: Ramped entrance, Stairs to enter Entrance Stairs-Rails: None Entrance Stairs-Number of Steps: 2 Discharge Bathroom Shower/Tub: Walk-in shower Discharge Bathroom Toilet: Handicapped height Discharge Bathroom Accessibility: Yes How Accessible: Accessible via walker Does the patient have any problems obtaining your medications?: No   Social/Family/Support Systems Patient Roles: Spouse Contact Information: [phone] Anticipated Caregiver: Christian Caregiver Availability: 24/7 Discharge Plan Discussed with Primary Caregiver: Yes Is Caregiver In Agreement with Plan?: No Does Caregiver/Family have Issues with Lodging/Transportation while Pt is in Rehab?: Yes   Goals Patient/Family Goal for Rehab: PT/OT/SLP Min A Expected length of stay: 14-16 days Pt/Family Agrees to  Admission and willing to participate: Yes Program Orientation Provided & Reviewed with Pt/Caregiver Including Roles  & Responsibilities: Yes   Decrease burden of Care through IP rehab admission: Decrease number of caregivers, Bowel  and bladder program, and Patient/family education   Possible need for SNF placement upon discharge: not anticipated    Patient Condition: This patient's medical and functional status has changed since the consult dated: 10/05/23 in which the Rehabilitation Physician determined and documented that the patient's condition is appropriate for intensive rehabilitative care in an inpatient rehabilitation facility. See "History of Present Illness" (above) for medical update. Functional changes are: pt. Has had surgical decompression and is now mod=2 to max+2 with mobility and ADLS. Patient's medical and functional status update has been discussed with the Rehabilitation physician and patient remains appropriate for inpatient rehabilitation. Will admit to inpatient rehab today.   Preadmission Screen Completed By:  Dorena Gander, 10/05/2023 2:40 PM ______________________________________________________________________   Discussed status with Dr. Sharl Davies  on 10/18/23 at 1052 and received approval for admission today.   Admission Coordinator:  Giles Labrum, time 1052/Date 10/18/23                Cosigned by: Genetta Kenning, MD at 10/18/2023 11:16 AM

## 2023-10-18 NOTE — H&P (Addendum)
 Physical Medicine and Rehabilitation Admission H&P        Chief Complaint  Patient presents with   Functional deficits      HPI: Christian Bailey is an 83 year old male with history fo T2DM, severe PAD s/p multiple vascular procedures, macular degeneration/retinal detachment, GIB, polyneuropathy, recurrent falls X anxiety/depression who was admitted with after a fall where he struck his head against the wall and had complaints of neck and back pain. He was found to have edematous changes due to acute ligamentous injury C2-C5, diffuse edema posterior cervical musculature, compression of spine at C5/6 and C6/7 due to DDD and ossification of posterior longitudinal ligament,   The patient states he has had onset of weakness around 6 months ago.  He has also noticed difficulty emptying his bladder.  His primary care physician saw him and started him on Myrbetriq .  He has not seen a urologist.  He has a history of diabetes with painful neuropathy and was managed with Jardiance at home.  He has required insulin  since hospitalization.  The patient states his wife has been assisting with showering and bathing prior to admission.  He was walking with a walker prior to admission. He states that he has had difficulty moving the right shoulder since a reverse shoulder arthroplasty done several years ago by Dr. Brunilda Capra. The patient experienced postoperative delirium and in fact had psychiatry consult on 10/11/2023.  They recommended change from Seroquel  to Haldol .  The patient had resolution of the symptoms and has not been using Haldol  for several days. Palliative care was consulted for goals of care.  She was prior to surgery.  Limited DNR status.  Palliative signed off     Review of Systems  Constitutional:  Negative for chills and fever.  HENT:  Negative for ear discharge, ear pain and nosebleeds.   Eyes:  Negative for discharge and redness.  Respiratory:  Positive for wheezing. Negative for hemoptysis,  shortness of breath and stridor.   Cardiovascular:  Negative for chest pain and palpitations.  Gastrointestinal:  Negative for nausea and vomiting.  Genitourinary:  Negative for flank pain and hematuria.  Musculoskeletal:  Positive for neck pain.  Skin:  Negative for rash.  Neurological:  Positive for focal weakness. Negative for loss of consciousness.  Endo/Heme/Allergies:  Does not bruise/bleed easily.  Psychiatric/Behavioral:  The patient is not nervous/anxious.             Past Medical History:  Diagnosis Date   Anemia     Complication of anesthesia     Diabetes mellitus (HCC)      TYPE 2   GERD (gastroesophageal reflux disease)     History of blood transfusion      GI bleed   History of colon polyps     History of hiatal hernia     Hyperlipemia     Hypertension     Osteoarthritis     PAD (peripheral artery disease) (HCC)      a. stenting of his left common iliac artery >20 years ago. b. h/o LEIA stent and 2 stents to R SFA in 2011. c. 04/2014:  s/p PTA of right SFA for in-stent restenosis, occluded left SFA   PONV (postoperative nausea and vomiting)      no porblem with the last 3 surgeries   RBBB (right bundle branch block with left anterior fascicular block)      NUCLEAR STRESS TEST, 08/18/2010 - no significant wall motion abnoramlities noted, post-stress EF 69%, normal  myocardial perfusion study   Sinus tachycardia      a. Noted during admission 04/2014 but upon review seems to be frequent finding for patient.   Stenosis of carotid artery      a. 50% right carotid stenosis by angiogram 04/2014.               Past Surgical History:  Procedure Laterality Date   ABDOMINAL AORTOGRAM W/LOWER EXTREMITY N/A 01/27/2017    Procedure: Abdominal Aortogram w/Lower Extremity;  Surgeon: Margherita Shell, MD;  Location: [facility];  Service: Cardiovascular;  Laterality: N/A;   ABDOMINAL AORTOGRAM W/LOWER EXTREMITY N/A 06/08/2017    Procedure: ABDOMINAL AORTOGRAM W/LOWER  EXTREMITY;  Surgeon: Margherita Shell, MD;  Location: [facility];  Service: Cardiovascular;  Laterality: N/A;   ABDOMINAL AORTOGRAM W/LOWER EXTREMITY N/A 08/31/2017    Procedure: ABDOMINAL AORTOGRAM W/LOWER EXTREMITY;  Surgeon: Margherita Shell, MD;  Location: [facility];  Service: Cardiovascular;  Laterality: N/A;  rt. unilateral   ANGIOPLASTY / STENTING FEMORAL       ANGIOPLASTY / STENTING ILIAC       ANTERIOR CERVICAL CORPECTOMY N/A 10/12/2023    Procedure: ANTERIOR CERVICAL DECOMPRESSION VIA CORPECTOMY WITH RECONSTRUCTION INCLUDING ANTERIOR CERVICAL PLATING CERVICAL FIVE-SIX,CERVICAL SIX-SEVEN;  Surgeon: Elna Haggis, MD;  Location: [facility];  Service: Neurosurgery;  Laterality: N/A;   AORTA - BILATERAL FEMORAL ARTERY BYPASS GRAFT N/A 07/06/2018    Procedure: AORTA BIFEMORAL BYPASS GRAFT USING 14X7MM X 40CM HEMASHIELD GOLD GRAFT;  Surgeon: Margherita Shell, MD;  Location: [facility];  Service: Vascular;  Laterality: N/A;   CEREBRAL ANGIOGRAM N/A 05/14/2014    Procedure: CEREBRAL ANGIOGRAM;  Surgeon: Avanell Leigh, MD;  Location: [facility] CATH LAB;  Service: Cardiovascular;  Laterality: N/A;   COLONOSCOPY W/ POLYPECTOMY       ENDARTERECTOMY Right 09/08/2017   ENDARTERECTOMY FEMORAL Right 09/08/2017    Procedure: REDO RIGHT FEMORAL ENDARTECTOMY WITH PATCH ANGIOPLASTY.;  Surgeon: Margherita Shell, MD;  Location: [facility];  Service: Vascular;  Laterality: Right;   EYE SURGERY Bilateral      cataract   FEMORAL ARTERY STENT Right 05/12/2010    Stented distally with a 6x100 Abbott absolute stent and proximally with a 6x60 Cook Zilver stent resulting in the reduction of the proximal segment 80% and mid segment 60-70% to 0% residual, LEFT common femoral artery stented with a 7x3 Smart stent resulting in reduction of 90% stenosis to 0% residual   FEMORAL-POPLITEAL BYPASS GRAFT Right 09/18/2016    Procedure: BYPASS GRAFT FEMORAL-POPLITEAL ARTERY;  Surgeon: Margherita Shell, MD;  Location: [facility];  Service:  Vascular;  Laterality: Right;   FEMORAL-POPLITEAL BYPASS GRAFT Right 09/08/2017    Procedure: REDO BYPASS GRAFT FEMORAL-POPLITEAL ARTERY;  Surgeon: Margherita Shell, MD;  Location: [facility];  Service: Vascular;  Laterality: Right;   FEMORAL-POPLITEAL BYPASS GRAFT Right 07/06/2018    Procedure: REVISION RIGHT FEMORAL TO POPLITEAL ARTERY BYPASS GRAFT;  Surgeon: Margherita Shell, MD;  Location: [facility];  Service: Vascular;  Laterality: Right;   IR CHOLANGIOGRAM EXISTING TUBE   08/04/2018   IR EXCHANGE BILIARY DRAIN   01/22/2019   IR PERC CHOLECYSTOSTOMY   07/19/2018   IR RADIOLOGIST EVAL & MGMT   08/31/2018   IR RADIOLOGIST EVAL & MGMT   01/17/2019   IR RADIOLOGIST EVAL & MGMT   02/01/2019   IR RADIOLOGIST EVAL & MGMT   02/08/2019   KNEE ARTHROSCOPY        left  LOWER EXTREMITY ANGIOGRAM N/A 05/14/2014    Procedure: LOWER EXTREMITY ANGIOGRAM;  Surgeon: Avanell Leigh, MD;  Location: [facility] CATH LAB;  Service: Cardiovascular;  Laterality: N/A;   LOWER EXTREMITY ANGIOGRAPHY N/A 09/21/2016    Procedure: Lower Extremity Angiography;  Surgeon: Arvil Lauber, MD;  Location: [facility]VAS[facility] CV LAB;  Service: Cardiovascular;  Laterality: N/A;   PERIPHERAL VASCULAR ATHERECTOMY Right 01/27/2017    Procedure: PERIPHERAL VASCULAR ATHERECTOMY;  Surgeon: Margherita Shell, MD;  [facility]: [facility];  Service: Cardiovascular;  Laterality: Right;   PERIPHERAL VASCULAR BALLOON ANGIOPLASTY Right 06/08/2017    Procedure: PERIPHERAL VASCULAR BALLOON ANGIOPLASTY;  Surgeon: Margherita Shell, MD;  Location: [facility];  Service: Cardiovascular;  Laterality: Right;  common femoral and superficial femoral arteries   PERIPHERAL VASCULAR CATHETERIZATION N/A 04/20/2016    Procedure: Lower Extremity Intervention;  Surgeon: Avanell Leigh, MD;  Location: [facility] INVASIVE CV LAB;  Service: Cardiovascular;  Laterality: N/A;   PERIPHERAL VASCULAR INTERVENTION   08/31/2017    Procedure: PERIPHERAL VASCULAR INTERVENTION;  Surgeon: Margherita Shell, MD;  Location: [facility];  Service: Cardiovascular;;  REIA   REVERSE SHOULDER ARTHROPLASTY Right 12/07/2016   REVERSE SHOULDER ARTHROPLASTY Right 12/07/2016    Procedure: REVERSE SHOULDER ARTHROPLASTY;  Surgeon: Winston Hawking, MD;  Location[facility] OR;  Service: Orthopedics;  Laterality: Right;   ROTATOR CUFF REPAIR Right 2003   SFA Right 05/14/2014    PTA  OF RT SFA         DR BERRY               Family History  Problem Relation Age of Onset   Heart disease Mother     Leukemia Mother     Stomach cancer Father     Esophageal cancer Brother     Liver disease Brother     Alcoholism Brother     Leukemia Brother     Leukemia Brother     Diabetes type II Brother     Lung disease Sister            Social History:  reports that he quit smoking about 35 years ago. He started smoking about 60 years ago. He has never used smokeless tobacco. He reports current alcohol  use. He reports that he does not use drugs.     Allergies       Allergies  Allergen Reactions   Almeda Jacobs ] Other (See Comments)      GI bleeding   Gadolinium Derivatives Itching, Swelling and Other (See Comments)      Pt complained of face flushing/hottness and throat tightness/scratchiness immediately after the injections.  Within 4 minutes, all symptoms were gone and the study was completed.  No further complications or signs of allergy were exhibited after completion of study.    Iodinated Contrast Media Other (See Comments)      Pt does not recall reaction   Pneumococcal 13-Val Conj Vacc        Other Reaction(s): achiness all over, dizziness, nausea, weakness   Scopolamine         Other Reaction(s): Delerium   Oxycodone -Acetaminophen  Other (See Comments)      Dizziness and feeling of being uncomfortable  Medications Prior to Admission  Medication Sig Dispense Refill   clopidogrel  (PLAVIX ) 75 MG tablet TAKE 1 TABLET  DAILY (Patient taking differently: Take 75 mg by mouth every evening.) 90 tablet 3   DULoxetine  (CYMBALTA ) 20 MG capsule Take 20 mg by mouth daily.       ferrous sulfate  325 (65 FE) MG tablet Take 325 mg by mouth daily.       JARDIANCE 10 MG TABS tablet Oral for 90 Days       lisinopril  (ZESTRIL ) 5 MG tablet Take 5 mg by mouth 2 (two) times daily.       metoprolol  succinate (TOPROL -XL) 25 MG 24 hr tablet Take 25 mg by mouth 2 (two) times daily.       MYRBETRIQ  50 MG TB24 tablet Take 50 mg by mouth daily.       pantoprazole  (PROTONIX ) 40 MG tablet Take 1 tablet (40 mg total) by mouth 2 (two) times daily. 180 tablet 3   rosuvastatin  (CRESTOR ) 20 MG tablet Take 20 mg by mouth every evening.        UNABLE TO FIND Take 1 tablet by mouth daily as needed (solidify bowel movements). Med Name: [place]              Home: Home Living Family/patient expects to be discharged to:: Private residence Living Arrangements: Spouse/significant other Available Help at Discharge: Family, Available 24 hours/day Type of Home: House Home Access: Stairs to enter Secretary/administrator of Steps: 3 Entrance Stairs-Rails: Left Home Layout: One level Bathroom Shower/Tub: Health visitor: Handicapped height Bathroom Accessibility: Yes Home Equipment: Agricultural consultant (2 wheels), Rollator (4 wheels), Shower seat - built in, Mudlogger: Sock aid, Reacher   Functional History: Prior Function Prior Level of Function : Needs assist, History of Falls (last six months) Mobility Comments: pt using RW in home, rollator in community. sideways on stairs for BUE support on rail. reports sit-stand can take 5-6 tries ADLs Comments: wife recently has helped with sponge bath, typically pt gets in shower. Pt recently fell putting on underwear while standing. Pt reports use of sock aid to donn socks   Functional Status:  Mobility: Bed Mobility Overal bed mobility: Needs Assistance Bed  Mobility: Rolling, Sidelying to Sit Rolling: Used rails, Min assist Sidelying to sit: Mod assist, HOB elevated, +2 for safety/equipment Sit to sidelying: Max assist General bed mobility comments: Cues to walk legs off bed no assist, mod assist for trunk Transfers Overall transfer level: Needs assistance Equipment used: Rolling walker (2 wheels), Ambulation equipment used Transfers: Bed to chair/wheelchair/BSC, Sit to/from Stand Sit to Stand: Mod assist, +2 physical assistance Bed to/from chair/wheelchair/BSC transfer type:: Via Lift equipment Stand pivot transfers: Total assist, +2 physical assistance (in sara stedy) Transfer via Lift Equipment: Stedy General transfer comment: EOB>BSC using Stedy, then [employer], then standing from chair to RW x2 with +2 lift assist; posterior bias upon standing Ambulation/Gait Ambulation/Gait assistance: Max assist, +2 physical assistance Gait Distance (Feet): 25 Feet Assistive device: Rolling walker (2 wheels) Gait Pattern/deviations: Step-through pattern, Decreased step length - right, Decreased step length - left, Ataxic General Gait Details: Unsteady throughout with heavy dependence on UE support; step width varying singificantly, with the need for near constant support; occasional siciisoring Pre-gait activities: standing hip flexion x10 reps with RW support; posterior lean; forward and backward steps x2 in front of chair but not safe to continue due to c/o BLE fatigue and LLE buckling   ADL: ADL Overall  ADL's : Needs assistance/impaired Eating/Feeding: Set up, Sitting Grooming: Wash/dry face, Oral care, Set up, Sitting Grooming Details (indicate cue type and reason): instructions on 2 cup technique Upper Body Bathing: Sitting, Set up Lower Body Bathing: Maximal assistance, +2 for physical assistance, Sit to/from stand Lower Body Bathing Details (indicate cue type and reason): stood in [place] to allow for cleaning following bowel  incontenance Upper Body Dressing : Set up, Sitting Upper Body Dressing Details (indicate cue type and reason): change gown Lower Body Dressing: Total assistance, +2 for physical assistance, Sit to/from stand Lower Body Dressing Details (indicate cue type and reason): change socks Toilet Transfer: Total assistance, +2 for safety/equipment, +2 for physical assistance Toilet Transfer Details (indicate cue type and reason): Pt able to come to stand with RW, unable to pivot +2 with stedy Toileting- Clothing Manipulation and Hygiene: Total assistance, +2 for physical assistance, +2 for safety/equipment, Sit to/from stand Toileting - Clothing Manipulation Details (indicate cue type and reason): Total assist, pt able to maintain standing with +1 Functional mobility during ADLs: Total assistance, +2 for physical assistance, +2 for safety/equipment General ADL Comments: patient demonstrated difficulty with coordination during grooming tasks but able to perform   Cognition: Cognition Orientation Level: Oriented to person, Oriented to place, Disoriented to situation, Disoriented to time Cognition Arousal: Alert Behavior During Therapy: [facility] for tasks assessed/performed   : Blood pressure 130/62, pulse 78, temperature (!) 97.4 F (36.3 C), temperature source Oral, resp. rate 18, height 5\' 5"  (1.651 m), weight 61.2 kg, SpO2 99%. Physical Exam   Lab Results Last 48 Hours        Results for orders placed or performed during the hospital encounter of 10/02/23 (from the past 48 hours)  Glucose, capillary     Status: Abnormal    Collection Time: 10/16/23 11:27 AM  Result Value Ref Range    Glucose-Capillary 152 (H) 70 - 99 mg/dL      Comment: Glucose reference range applies only to samples taken after fasting for at least 8 hours.  Glucose, capillary     Status: Abnormal    Collection Time: 10/16/23  4:11 PM  Result Value Ref Range    Glucose-Capillary 199 (H) 70 - 99 mg/dL      Comment: Glucose  reference range applies only to samples taken after fasting for at least 8 hours.  Glucose, capillary     Status: Abnormal    Collection Time: 10/16/23  9:27 PM  Result Value Ref Range    Glucose-Capillary 283 (H) 70 - 99 mg/dL      Comment: Glucose reference range applies only to samples taken after fasting for at least 8 hours.  Glucose, capillary     Status: Abnormal    Collection Time: 10/17/23  6:05 AM  Result Value Ref Range    Glucose-Capillary 231 (H) 70 - 99 mg/dL      Comment: Glucose reference range applies only to samples taken after fasting for at least 8 hours.  Glucose, capillary     Status: Abnormal    Collection Time: 10/17/23 12:19 PM  Result Value Ref Range    Glucose-Capillary 183 (H) 70 - 99 mg/dL      Comment: Glucose reference range applies only to samples taken after fasting for at least 8 hours.  Glucose, capillary     Status: Abnormal    Collection Time: 10/17/23  4:53 PM  Result Value Ref Range    Glucose-Capillary 247 (H) 70 - 99 mg/dL  Comment: Glucose reference range applies only to samples taken after fasting for at least 8 hours.  Glucose, capillary     Status: Abnormal    Collection Time: 10/17/23 10:06 PM  Result Value Ref Range    Glucose-Capillary 324 (H) 70 - 99 mg/dL      Comment: Glucose reference range applies only to samples taken after fasting for at least 8 hours.  Glucose, capillary     Status: Abnormal    Collection Time: 10/18/23  6:45 AM  Result Value Ref Range    Glucose-Capillary 155 (H) 70 - 99 mg/dL      Comment: Glucose reference range applies only to samples taken after fasting for at least 8 hours.  Glucose, capillary     Status: Abnormal    Collection Time: 10/18/23  9:17 AM  Result Value Ref Range    Glucose-Capillary 114 (H) 70 - 99 mg/dL      Comment: Glucose reference range applies only to samples taken after fasting for at least 8 hours.      Imaging Results (Last 48 hours)  No results found.         Blood  pressure 130/62, pulse 78, temperature (!) 97.4 F (36.3 C), temperature source Oral, resp. rate 18, height 5\' 5"  (1.651 m), weight 61.2 kg, SpO2 99%.   General: No acute distress Mood and affect are appropriate Heart: Regular rate and rhythm no rubs murmurs or extra sounds Lungs: Clear to auscultation, breathing unlabored, occasional wheezing on left Abdomen: Positive bowel sounds, soft nontender to palpation, nondistended Extremities: No clubbing, cyanosis, or edema Skin: No evidence of breakdown, no evidence of rash Neurologic: Cranial nerves II through XII intact, motor strength is 4/5 in bilateral  bicep, tricep, grip,and finger ext and hand intrinsics, 3- RIght delt and 4/5 left delt, 3- bilateral  hip flexor, 3- RIght and 4- left knee extensors, 4/5 bilateral ankle dorsiflexor and plantar flexor Sensory exam normal sensation to light touch and proprioception in bilateral upper and lower extremities Tone- 3+ a bilateral biceps triceps and BR, + Hoffman's bilateral 2/5 RIght 3+ left knee Also MAS 3 tone in RIght Knee flexors and extensors, MAS 1 at the left knee Clonus at bilateral ankles  Musculoskeletal: Full range of motion in all 4 extremities. No joint swelling  Medical Problem List and Plan: 1. Functional deficits secondary to cervical myelopathy s/p C5-7 ACDF 10/12/23, has R>L lower ext weakness as primary neurologic deficits             -patient may  shower             -ELOS/Goals: Sup 14-16d 2.  Antithrombotics: -DVT/anticoagulation:  Mechanical: Sequential compression devices, below knee Bilateral lower extremities             -antiplatelet therapy: N/A 3. Pain Management: tylenol  prn.  4. Mood/Behavior/Sleep: LCSW to follow up for evaluation and support when appropriate             --Delirium precautions--His mental status has cleared but will monitor for worsening with transfer to new unit .pt has not required prn meds for agitation in several days  Monitor sleep wake  cycle             -antipsychotic agents: Change haldol  q 6 hrs prn to Seroquel  prn 5. Neuropsych/cognition: This patient is capable of making decisions on his own behalf. 6. Skin/Wound Care: Routine pressure relief measures. 7. Fluids/Electrolytes/Nutrition:  Monitor I/O. Check CMET in am 8. Right frozen shoulder  following Right reverse shoulder arthroplasty, do not expect any significant improvement with therapy will need to compensate  9.  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  Diabetes with peripheral neuropathy by hx only on Jardiance at home, use SSI in the hospital  11.  Spasticity affecting LEs> UE and R>L , aggressive ROM  with PT, consider oral meds  12.  PAD s/p SFA and Iliac stenting  Zelda Hickman, PA-C 10/18/2023 "I have personally performed a face to face diagnostic evaluation of this patient.  Additionally, I have reviewed and concur with the physician assistant's documentation above." Genetta Kenning M.D. [facility] Fellow Am Acad of [employer] Med and Rehab Diplomate Am Board of Electrodiagnostic Med Fellow Am Board of Interventional Pain

## 2023-10-18 NOTE — Progress Notes (Signed)
 Inpatient Rehab Admissions Coordinator:    I have a CIR bed for this pt. Today. RN may call report to [phone]  Pt. Will dc to CIR for an estimated 16-18 days with the goal of discharging home at min A with assistance from his wife.   Wandalee Gust, MS, CCC-SLP Rehab Admissions Coordinator  [phone] (celll) [phone] (office)

## 2023-10-18 NOTE — Progress Notes (Addendum)
 Arrived to CIR today.  Admission/Assessment complete. Discussed unit protocols, education binder/education board, and schedules/meals. Fluids at bedside, call bell within reach.   Randeen Busman, LPN

## 2023-10-18 NOTE — Progress Notes (Signed)
 Report received from assigned RN/5N. Currently waiting on a vacant room/room to be cleaned.  Randeen Busman, LPN

## 2023-10-18 NOTE — Progress Notes (Signed)
 Patient reports left shoulder pain since fall. Noted to have crackles RUL/RLL and reports PNA about a month ago--Jan 2025 per chart review. No respiratory symptoms but some cough in past 2-3 days. Will order CXR for follow up. Has failed voiding trial and foley replaced a few days ago per nursing--will order miralax  tonight as no BM for 2 days--goes every other day. D/c Mirabegron  today and  d/c foley in am.

## 2023-10-18 NOTE — Discharge Summary (Signed)
 Physician Discharge Summary  Christian Bailey:[id number] DOB: [birth year] DOA: 10/02/2023  PCP: Avva, Ravisankar, MD  Admit date: 10/02/2023 Discharge date: 10/18/2023  Admitted From: Home Disposition: Inpatient rehab  Recommendations for Outpatient Follow-up:  Follow up with PCP in 1-2 weeks Follow-up with neurosurgery as scheduled  Discharge Condition: Stable CODE STATUS: DNR Diet recommendation: Regular diet as tolerated  Brief/Interim Summary: 83 year old with history of peripheral artery disease on Plavix  and Crestor , right bundle branch block, chronic diastolic heart failure, CKD stage IV, ambulatory dysfunction, hypertension,  hyperlipidemia,  type 2 diabetes, chronic anxiety and depression and GERD presented to the emergency room with a fall.  Patient reportedly with worsening mobility since last 6 months.  In the emergency room hemodynamically stable.  Imaging consistent with posterior spinous ligament injury from C2-C5.  Difficult to tolerate pain medications and getting delirious with narcotics.  Followed by neurosurgery.  Also with leukocytosis. Patient developed severe agitation and hyper active delirium.   4/14 Encephalopathy resolving 4/15 Cervical decompression with Neurosurgery, [facility] - tolerated well 4/18 Patient clinically improving.  Waiting to go to CIR. Clinically stable for discharge. 4/21 patient accepted to inpatient rehab, remains medically stable for discharge  Discharge Diagnoses:  Principal Problem:   Fall Active Problems:   Intractable back pain   Delirium due to another medical condition, acute, hyperactive  Acute metabolic encephalopathy, multifactorial, resolved History of hospital delirium/sundowning Likely secondary to polypharmacy, hospital-acquired delirium and narcotics Developed severe delirium and hallucination in the interim but now resolved, back to baseline. Responded well to haloperidol  and Seroquel . Pain controlled with Flexeril  and  Tylenol    Trauma, cervical myelopathy: Followed by neurosurgery - Successful cervical spine decompression and stabilization 4/15. Judicious use of narcotics given above (not requiring narcotics now for days) Steroid taper ongoing -continue at discharge   Acute urinary retention: Likely secondary to above. Difficulty tolerating In-N-Out catheter with minimal hematuria over the past 24 hours, Foley placed in the interim to ensure no ongoing traumatic episodes.  Would trial discontinuation of Foley in the next 72 to 96 hours, if ongoing hematuria or painful In-N-Out cath would replace Foley and have follow-up with urology outpatient  Acute intractable pain Peripheral arterial disease Continue Cymbalta , Tylenol , Lyrica , Flexeril   Bowel regimen.    CKD stage IV: At about baseline.   Anxiety depression: On Cymbalta .   Leukocytosis without clear source Cultures negative Imaging unremarkable Completed 5 days of azithromycin  and ceftriaxone    Essential hypertension: Blood pressure stable.  Holding lisinopril .  On Toprol -XL. Type 2 diabetes uncontrolled with hyperglycemia, A1c 8 -continue diabetic diet/resume home medications.   Dysphagia: Chronic dysphagia and along with retropharyngeal edema from cervical spine surgery.  Improving. Prednisone  taper ongoing.  Discharge Instructions  Discharge Instructions     Incentive spirometry RT   Complete by: As directed       Allergies as of 10/18/2023       Reactions   Asa [aspirin ] Other (See Comments)   GI bleeding   Gadolinium Derivatives Itching, Swelling, Other (See Comments)   Pt complained of face flushing/hottness and throat tightness/scratchiness immediately after the injections.  Within 4 minutes, all symptoms were gone and the study was completed.  No further complications or signs of allergy were exhibited after completion of study.    Iodinated Contrast Media Other (See Comments)   Pt does not recall reaction   Pneumococcal  13-val Conj Vacc    Other Reaction(s): achiness all over, dizziness, nausea, weakness   Scopolamine     Other Reaction(s): Delerium  Oxycodone -acetaminophen  Other (See Comments)   Dizziness and feeling of being uncomfortable         Medication List     STOP taking these medications    clopidogrel  75 MG tablet Commonly known as: PLAVIX    lisinopril  5 MG tablet Commonly known as: ZESTRIL    UNABLE TO FIND       TAKE these medications    acetaminophen  325 MG tablet Commonly known as: TYLENOL  Take 2 tablets (650 mg total) by mouth every 4 (four) hours as needed for mild pain (pain score 1-3) (or temp > 100.5).   cyclobenzaprine  5 MG tablet Commonly known as: FLEXERIL  Take 1 tablet (5 mg total) by mouth 3 (three) times daily as needed for muscle spasms.   DULoxetine  20 MG capsule Commonly known as: CYMBALTA  Take 1 capsule (20 mg total) by mouth daily. Start taking on: October 19, 2023   ferrous sulfate  325 (65 FE) MG tablet Take 325 mg by mouth daily.   HYDROcodone -acetaminophen  5-325 MG tablet Commonly known as: NORCO/VICODIN Take 1 tablet by mouth every 6 (six) hours as needed.   Jardiance 10 MG Tabs tablet Generic drug: empagliflozin Oral for 90 Days   methylPREDNISolone  4 MG Tbpk tablet Commonly known as: MEDROL  DOSEPAK Take Medrol  Dosepak as per manufacturer's direction.   metoprolol  succinate 50 MG 24 hr tablet Commonly known as: TOPROL -XL Take 1 tablet (50 mg total) by mouth daily. Take with or immediately following a meal. Start taking on: October 19, 2023 What changed:  medication strength how much to take when to take this additional instructions   Myrbetriq  50 MG Tb24 tablet Generic drug: mirabegron  ER Take 50 mg by mouth daily.   pantoprazole  40 MG tablet Commonly known as: PROTONIX  Take 1 tablet (40 mg total) by mouth 2 (two) times daily.   pregabalin  50 MG capsule Commonly known as: LYRICA  Take 1 capsule (50 mg total) by mouth 2 (two)  times daily.   rosuvastatin  20 MG tablet Commonly known as: CRESTOR  Take 20 mg by mouth every evening.        Follow-up Information     Lonzie Robins, MD.   Specialty: Internal Medicine Contact information: [address] [place] [address] [phone]         Joaquin Mulberry, MD.   Specialty: Neurosurgery Contact information: [address] [place] [address] [phone]                Allergies  Allergen Reactions   Marlyse Single ] Other (See Comments)    GI bleeding   Gadolinium Derivatives Itching, Swelling and Other (See Comments)    Pt complained of face flushing/hottness and throat tightness/scratchiness immediately after the injections.  Within 4 minutes, all symptoms were gone and the study was completed.  No further complications or signs of allergy were exhibited after completion of study.    Iodinated Contrast Media Other (See Comments)    Pt does not recall reaction   Pneumococcal 13-Val Conj Vacc     Other Reaction(s): achiness all over, dizziness, nausea, weakness   Scopolamine      Other Reaction(s): Delerium   Oxycodone -Acetaminophen  Other (See Comments)    Dizziness and feeling of being uncomfortable     Consultations: Neurosurgery  Psychiatry Palliative care  Procedures/Studies: DG Swallowing Func-Speech Pathology Result Date: 10/16/2023 CLINICAL DATA:  Dysphagia. Cough/GE reflux disease/other secondary diagnosis EXAM: MODIFIED BARIUM SWALLOW TECHNIQUE: Different consistencies of barium were administered orally to the patient by the Speech Pathologist. Imaging of  the pharynx was performed in the lateral projection. Radiologist, not in attendance for the exam. Different consistencies of barium were administered orally to the patient by the Speech Pathologist. Imaging of the pharynx was performed in the lateral projection. The radiologist was present in the fluoroscopy room for this study, providing personal  supervision. FLUOROSCOPY TIME:  Radiation Exposure Index (as provided by the fluoroscopic device): 2 minutes 34 seconds 23.73 mGy COMPARISON:  None Available. FINDINGS: Modified barium swallow was performed by the speech pathologist. Radiologist was not involved with this exam. Please refer to the Speech Pathology report for results and recommendations. IMPRESSION: Please refer to the Speech Pathologists report for complete details and recommendations. Electronically Signed   By: Bettylou Brunner M.D.   On: 10/16/2023 10:06   DG Cervical Spine Complete Result Date: 10/12/2023 CLINICAL DATA:  Intraoperative images of anterior cervical discectomy and fusion EXAM: CERVICAL SPINE - COMPLETE 4 VIEW COMPARISON:  CT cervical spine dated 10/02/2023 FINDINGS: Cross-table lateral views of the cervical spine. Endotracheal tube tip terminates at the level of T3-4. Straightening of the cervical lordosis. Multilevel degenerative changes of the cervical spine, better evaluated on prior CT. Sequential images demonstrate localization of the C5-6 intervertebral disc space with a subsequent anterior fusion of C5-C7. IMPRESSION: Intraoperative images of the cervical spine for anterior cervical discectomy and fusion spanning C5-C7. Electronically Signed   By: Limin  Xu M.D.   On: 10/12/2023 14:44   CT HEAD WO CONTRAST ( ) Result Date: 10/05/2023 CLINICAL DATA:  Initial evaluation for mental status change, unknown cause. EXAM: CT HEAD WITHOUT CONTRAST TECHNIQUE: Contiguous axial images were obtained from the base of the skull through the vertex without intravenous contrast. RADIATION DOSE REDUCTION: This exam was performed according to the departmental dose-optimization program which includes automated exposure control, adjustment of the mA and/or kV according to patient size and/or use of iterative reconstruction technique. COMPARISON:  CT from 10/02/2023. FINDINGS: Brain: Cerebral volume within normal limits. Mild chronic  microvascular ischemic disease for age. Subtle layering hyperdensity now seen within the occipital horns of both lateral ventricles, consistent with small volume intraventricular hemorrhage. Probable trace subarachnoid hemorrhage noted about the room bilateral cerebral hemispheres as well. Debris/proteinaceous material related to infection would be the primary differential consideration. No other acute intracranial hemorrhage. No acute large vessel territory infarct. No mass lesion or midline shift. No hydrocephalus or extra-axial fluid collection. Vascular: No abnormal hyperdense vessel. Scattered calcified atherosclerosis present at skull base. Skull: Scalp soft tissues demonstrate no acute finding. Calvarium intact. Sinuses/Orbits: Globes orbital soft tissues within normal limits. Paranasal sinuses are clear. No mastoid effusion. Other: None. IMPRESSION: 1. Interval development of small volume intraventricular hemorrhage within the occipital horns of both lateral ventricles, with additional trace subarachnoid hemorrhage about the bilateral cerebral hemispheres as well. 2. Underlying mild chronic microvascular ischemic disease for age. Critical Value/emergent results were called by telephone at the time of interpretation on 10/05/2023 at 6:46 pm to provider Diego Foy , who verbally acknowledged these results. Electronically Signed   By: Virgia Griffins M.D.   On: 10/05/2023 18:53   MR Cervical Spine Wo Contrast Result Date: 10/02/2023 CLINICAL DATA:  Neck trauma, ligament injury suspected. Fall from standing position. EXAM: MRI CERVICAL SPINE WITHOUT CONTRAST TECHNIQUE: Multiplanar, multisequence MR imaging of the cervical spine was performed. No intravenous contrast was administered. COMPARISON:  CT of the cervical spine 10/02/2023. FINDINGS: Alignment: Straightening of the normal cervical lordosis is again noted. No significant listhesis is present. Vertebrae: Marrow signal and vertebral  body  heights are normal. Cord: The ventral cord is compressed C5-6 and C6-7 secondary to disc disease and ossification of the posterior longitudinal ligament. Posterior Fossa, vertebral arteries, paraspinal tissues: Craniocervical junction is normal. Flow is present in the vertebral arteries bilaterally. Visualized intracranial contents are normal. Edematous changes are present along the posterior spinous ligament from C2-C5. Diffuse edematous changes are present within the posterior paraspinous musculature bilaterally throughout the cervical spine. Disc levels: C2-3: A rightward disc osteophyte complex partially effaces the ventral CSF. Mild foraminal narrowing is present bilaterally. C3-4: A broad-based disc osteophyte complex is present. Partial effacement of ventral CSF is noted. Mild foraminal narrowing is present bilaterally. C4-5: Ossification of posterior longitudinal ligament partially effaces the ventral CSF. The foramina are patent bilaterally. C5-6: A left paramedian disc osteophyte complex and ossification of posterior longitudinal ligament compress the spinal cord to 5 mm. Moderate left and mild right foraminal stenosis is present. C6-7: A central disc protrusion and ossification of posterior longitudinal ligament compress the spinal cord 5 mm. Moderate foraminal stenosis is present bilaterally. C7-T1: A broad-based disc osteophyte complex is asymmetric to the right. This effaces the ventral CSF. Moderate right and mild left foraminal stenosis is present. IMPRESSION: 1. Edematous changes along the posterior spinous ligament from C2-C5 compatible with acute ligamentous injury. 2. Diffuse edematous changes within the posterior paraspinous musculature bilaterally throughout the cervical spine compatible with muscle strain. 3. Multilevel spondylosis of the cervical spine as described. 4. Compression of the spinal cord to 5 mm at C5-6 and C6-7 secondary to disc disease and ossification of posterior longitudinal  ligament. 5. Moderate foraminal stenosis bilaterally at C6-7 and on the right at C7-T1. 6. Mild foraminal narrowing bilaterally at C2-3 and C3-4. These results were called by telephone at the time of interpretation on 10/02/2023 at 5:58 pm to provider [facility] , who verbally acknowledged these results. Electronically Signed   By: Audree Leas M.D.   On: 10/02/2023 17:58   CT Thoracic Spine Wo Contrast Result Date: 10/02/2023 CLINICAL DATA:  Back trauma, no prior imaging (Age >= 16y) fall Fall from standing.  Back pain. EXAM: CT THORACIC SPINE WITHOUT CONTRAST TECHNIQUE: Multidetector CT images of the thoracic were obtained using the standard protocol without intravenous contrast. RADIATION DOSE REDUCTION: This exam was performed according to the departmental dose-optimization program which includes automated exposure control, adjustment of the mA and/or kV according to patient size and/or use of iterative reconstruction technique. COMPARISON:  None Available. FINDINGS: Alignment: Normal. Vertebrae: No acute fracture. Normal vertebral body heights. Posterior elements are intact. There is fusion of the spinous processes. Paraspinal and other soft tissues: No paraspinal hematoma. Retained mucus within the right mainstem bronchus extending into all lobar bronchi. Mucoid impaction in the right lower lobe bronchi, with progression from December 2024 chest CT. Additional areas of nodular airspace disease within both lower lobes have improved. Disc levels: Diffuse anterior spurring. Scattered ossification of the discs. Posterior spurring at T12-L1 causes spinal canal stenosis. IMPRESSION: 1. No acute fracture or subluxation of the thoracic spine. 2. Retained mucus within the right mainstem bronchus extending into all lobar bronchi. Mucoid impaction in the right lower lobe bronchi, with progression from December 2024 chest CT. Additional areas of nodular airspace disease within both lower lobes have improved  from prior exam. Recommend correlation for aspiration risk factors. Electronically Signed   By: Chadwick Colonel M.D.   On: 10/02/2023 16:14   CT Cervical Spine Wo Contrast Result Date: 10/02/2023 CLINICAL DATA:  Neck  trauma (Age >= 65y) Standing.  Neck pain. EXAM: CT CERVICAL SPINE WITHOUT CONTRAST TECHNIQUE: Multidetector CT imaging of the cervical spine was performed without intravenous contrast. Multiplanar CT image reconstructions were also generated. RADIATION DOSE REDUCTION: This exam was performed according to the departmental dose-optimization program which includes automated exposure control, adjustment of the mA and/or kV according to patient size and/or use of iterative reconstruction technique. COMPARISON:  None Available. FINDINGS: Alignment: Straightening of normal lordosis. No traumatic subluxation. Skull base and vertebrae: No acute fracture. Vertebral body heights are maintained. The dens and skull base are intact. Soft tissues and spinal canal: No prevertebral fluid or swelling. No visible canal hematoma. Disc levels: There is diffuse ossification of the posterior longitudinal ligament extending from CT through C7. Additional a posterior disc osteophyte complex. Varying degrees of spinal canal stenosis. Upper chest: No acute findings.  Emphysema. Other: Carotid calcifications. IMPRESSION: 1. No acute fracture or subluxation of the cervical spine. 2. Diffuse ossification of the posterior longitudinal ligament extending from C2 through C7. Additional posterior disc osteophyte complex. Varying degrees of spinal canal stenosis. Consider further assessment with MRI as clinically indicated. Electronically Signed   By: Chadwick Colonel M.D.   On: 10/02/2023 16:08   CT Head Wo Contrast Result Date: 10/02/2023 CLINICAL DATA:  Head trauma, minor (Age >= 65y) Fall from standing. EXAM: CT HEAD WITHOUT CONTRAST TECHNIQUE: Contiguous axial images were obtained from the base of the skull through the vertex  without intravenous contrast. RADIATION DOSE REDUCTION: This exam was performed according to the departmental dose-optimization program which includes automated exposure control, adjustment of the mA and/or kV according to patient size and/or use of iterative reconstruction technique. COMPARISON:  None Available. FINDINGS: Brain: No intracranial hemorrhage, mass effect, or midline shift. Age related atrophy per no hydrocephalus. The basilar cisterns are patent. Mild for age periventricular chronic small vessel ischemia. No evidence of territorial infarct or acute ischemia. No extra-axial or intracranial fluid collection. Vascular: Atherosclerosis of skullbase vasculature without hyperdense vessel or abnormal calcification. Skull: No fracture or focal lesion. Sinuses/Orbits: No acute finding.  Bilateral cataract resection. Other: No confluent scalp hematoma. IMPRESSION: 1. No acute intracranial abnormality. No skull fracture. 2. Age related atrophy and chronic small vessel ischemia. Electronically Signed   By: Chadwick Colonel M.D.   On: 10/02/2023 16:03   DG Forearm Right Result Date: 10/02/2023 CLINICAL DATA:  Fall.  Pain. EXAM: RIGHT FOREARM - 2 VIEW COMPARISON:  None Available. FINDINGS: No evidence of acute fracture. Mild degenerative change of the wrist and elbow, no dislocation. No erosions or focal bone abnormality. Vascular calcifications are seen. There is focal soft tissue prominence overlying the mid forearm. IMPRESSION: Focal soft tissue prominence overlying the mid forearm. No acute fracture or subluxation. Electronically Signed   By: Chadwick Colonel M.D.   On: 10/02/2023 15:20   DG Pelvis Portable Result Date: 10/02/2023 CLINICAL DATA:  Fall. EXAM: PORTABLE PELVIS 1-2 VIEWS COMPARISON:  None Available. FINDINGS: The intertrochanteric and lateral right femur not included in the field of view. No evidence of pelvic fracture. Pubic rami are intact. No hip dislocation. Moderate bilateral hip  osteoarthritis. Pubic symphysis and sacroiliac joints are congruent. Bilateral iliac stents. Right femoral stent is partially included. IMPRESSION: 1. No pelvic fracture. 2. Moderate bilateral hip osteoarthritis. Electronically Signed   By: Chadwick Colonel M.D.   On: 10/02/2023 15:18   DG Chest Port 1 View Result Date: 10/02/2023 CLINICAL DATA:  Fall. EXAM: PORTABLE CHEST 1 VIEW COMPARISON:  Radiograph 05/10/2023.  CT 06/07/2023 FINDINGS: The cardiomediastinal contours are normal. The lungs are clear. Pulmonary vasculature is normal. No consolidation, pleural effusion, or pneumothorax. Reverse right shoulder arthroplasty. On limited assessment, no acute osseous finding. IMPRESSION: No active disease. Electronically Signed   By: Chadwick Colonel M.D.   On: 10/02/2023 15:16     Subjective: No acute issues or events overnight denies nausea vomiting diarrhea constipation any fevers chills chest pain   Discharge Exam: Vitals:   10/18/23 0536 10/18/23 0900  BP: 128/72 130/62  Pulse: 80 78  Resp: 17 18  Temp: 97.7 F (36.5 C) (!) 97.4 F (36.3 C)  SpO2: 98% 99%   Vitals:   10/17/23 1935 10/18/23 0457 10/18/23 0536 10/18/23 0900  BP: 124/63  128/72 130/62  Pulse: 79  80 78  Resp: 16  17 18   Temp: (!) 97.5 F (36.4 C)  97.7 F (36.5 C) (!) 97.4 F (36.3 C)  TempSrc:   Axillary Oral  SpO2: 100%  98% 99%  Weight:  61.2 kg    Height:        General: Pt is alert, awake, not in acute distress Cardiovascular: RRR, S1/S2 +, no rubs, no gallops Respiratory: CTA bilaterally, no wheezing, no rhonchi Abdominal: Soft, NT, ND, bowel sounds + Extremities: no edema, no cyanosis    The results of significant diagnostics from this hospitalization (including imaging, microbiology, ancillary and laboratory) are listed below for reference.     Microbiology: Recent Results (from the past 240 hours)  Surgical pcr screen     Status: None   Collection Time: 10/12/23  3:24 AM   Specimen: Nasal  Mucosa; Nasal Swab  Result Value Ref Range Status   MRSA, PCR NEGATIVE NEGATIVE Final   Staphylococcus aureus NEGATIVE NEGATIVE Final    Comment: (NOTE) The Xpert SA Assay (FDA approved for NASAL specimens in patients 71 years of age and older), is one component of a comprehensive surveillance program. It is not intended to diagnose infection nor to guide or monitor treatment. Performed at [facility], [address], [place], [place] [address]      Labs: BNP (last 3 results) Recent Labs    05/09/23 0454 05/10/23 0358 05/11/23 0323  BNP 361.6* 286.5* 338.1*   Basic Metabolic Panel: Recent Labs  Lab 10/16/23 0615  NA 140  K 4.2  CL 107  CO2 22  GLUCOSE 222*  BUN 44*  CREATININE 1.68*  CALCIUM  8.8*  MG 2.0  PHOS 3.5   Liver Function Tests: Recent Labs  Lab 10/16/23 0615  AST 15  ALT 8  ALKPHOS 57  BILITOT 0.4  PROT 6.4*  ALBUMIN  2.9*   No results for input(s): "LIPASE", "AMYLASE" in the last 168 hours. No results for input(s): "AMMONIA" in the last 168 hours. CBC: Recent Labs  Lab 10/16/23 0615  WBC 10.0  NEUTROABS 8.6*  HGB 9.4*  HCT 28.9*  MCV 88.9  PLT 429*   Cardiac Enzymes: No results for input(s): "CKTOTAL", "CKMB", "CKMBINDEX", "TROPONINI" in the last 168 hours. BNP: Invalid input(s): "POCBNP" CBG: Recent Labs  Lab 10/17/23 1219 10/17/23 1653 10/17/23 2206 10/18/23 0645 10/18/23 0917  GLUCAP 183* 247* 324* 155* 114*   D-Dimer No results for input(s): "DDIMER" in the last 72 hours. Hgb A1c No results for input(s): "HGBA1C" in the last 72 hours. Lipid Profile No results for input(s): "CHOL", "HDL", "LDLCALC", "TRIG", "CHOLHDL", "LDLDIRECT" in the last 72 hours. Thyroid  function studies No results for input(s): "TSH", "T4TOTAL", "T3FREE", "THYROIDAB" in the  last 72 hours.  Invalid input(s): "FREET3" Anemia work up No results for input(s): "VITAMINB12", "FOLATE", "FERRITIN", "TIBC", "IRON", "RETICCTPCT" in the last 72  hours. Urinalysis    Component Value Date/Time   COLORURINE YELLOW 10/06/2023 0514   APPEARANCEUR CLEAR 10/06/2023 0514   LABSPEC 1.013 10/06/2023 0514   PHURINE 5.0 10/06/2023 0514   GLUCOSEU >=500 (A) 10/06/2023 0514   HGBUR NEGATIVE 10/06/2023 0514   BILIRUBINUR NEGATIVE 10/06/2023 0514   KETONESUR NEGATIVE 10/06/2023 0514   PROTEINUR NEGATIVE 10/06/2023 0514   NITRITE NEGATIVE 10/06/2023 0514   LEUKOCYTESUR NEGATIVE 10/06/2023 0514   Sepsis Labs Recent Labs  Lab 10/16/23 0615  WBC 10.0   Microbiology Recent Results (from the past 240 hours)  Surgical pcr screen     Status: None   Collection Time: 10/12/23  3:24 AM   Specimen: Nasal Mucosa; Nasal Swab  Result Value Ref Range Status   MRSA, PCR NEGATIVE NEGATIVE Final   Staphylococcus aureus NEGATIVE NEGATIVE Final    Comment: (NOTE) The Xpert SA Assay (FDA approved for NASAL specimens in patients 61 years of age and older), is one component of a comprehensive surveillance program. It is not intended to diagnose infection nor to guide or monitor treatment. Performed at [facility], [address] [place], [place] [address]      Time coordinating discharge: Over 30 minutes  SIGNED:   Haydee Lipa, DO [facility] 10/18/2023, 11:36 AM Pager   If 7PM-7AM, please contact night-coverage [email or web address]

## 2023-10-18 NOTE — Plan of Care (Signed)
   Medical records reviewed including progress notes, labs, and imaging. Goals of care are clear for aggressive rehabilitation with the hope that patient can then return home with wife as caregiver. Patient awaiting CIR placement.  No further palliative needs identified. PMT will sign off at this time.  Thank you for your referral and allowing PMT to assist in Mr. Lendell Gallick care.   Patty Lopezgarcia, PA-C Palliative Medicine Team  Team Phone # [phone]   NO CHARGE

## 2023-10-18 NOTE — Progress Notes (Signed)
 Inpatient Rehabilitation Admission Medication Review by a Pharmacist  A complete drug regimen review was completed for this patient to identify any potential clinically significant medication issues.  High Risk Drug Classes Is patient taking? Indication by Medication  Antipsychotic Yes, as an intravenous medication Compazine - n/v  Seroquel - delirium / agitation  Anticoagulant No   Antibiotic No   Opioid No   Antiplatelet No   Hypoglycemics/insulin  Yes Insulin  - DM   Vasoactive Medication Yes Toprol - HF   Chemotherapy No   Other Yes fleet enema , bisacodyl  , Senokot-S- constipation Maalox- indigestion Pantoprazole - reflux  Diphenhydramine - itching  Acetaminophen - pain   melatonin and -insomnia Crestor - HLD  Prednisone - retropharyngeal edema  Myrbetriq - urinary incontinence Lyrica - neuropathy PO iron- supplement  Duloxetine - mood Flexeril - muscle spasms        Type of Medication Issue Identified Description of Issue Recommendation(s)  Drug Interaction(s) (clinically significant)     Duplicate Therapy     Allergy     No Medication Administration End Date     Incorrect Dose     Additional Drug Therapy Needed     Significant med changes from prior encounter (inform family/care partners about these prior to discharge).  Communicate relevant medication changes to patient/family members at discharge from CIR.   Restart or discontinue PTA meds not resumed in CIR at discharge if clinically indicated.   Other  PTA meds not resumed: lisinopril  5, Jardiance, plavix  (stopped inpt by NSGY)      Clinically significant medication issues were identified that warrant physician communication and completion of prescribed/recommended actions by midnight of the next day:  No  Name of provider notified for urgent issues identified:   Provider Method of Notification:     Pharmacist comments:   Crestor  20 mg borderline for Crestor  dose adjustment to 10 mg given CrCl <30 mL/min.  Given CKD would recommend changing to atorvastatin 80 mg in lieu of dose-adjusting Crestor .   Time spent performing this drug regimen review (minutes):  20   Christian Bailey BS, PharmD, BCPS Clinical Pharmacist 10/18/2023 3:49 PM  Contact: [phone] after 3 PM  "Be curious, not judgmental..." -Rumalda Counter

## 2023-10-18 NOTE — Plan of Care (Signed)
  Problem: Education: Goal: Ability to describe self-care measures that may prevent or decrease complications (Diabetes Survival Skills Education) will improve Outcome: Progressing Goal: Individualized Educational Video(s) Outcome: Progressing   Problem: Coping: Goal: Ability to adjust to condition or change in health will improve Outcome: Progressing   Problem: Fluid Volume: Goal: Ability to maintain a balanced intake and output will improve Outcome: Progressing   Problem: Health Behavior/Discharge Planning: Goal: Ability to identify and utilize available resources and services will improve Outcome: Progressing Goal: Ability to manage health-related needs will improve Outcome: Progressing   Problem: Metabolic: Goal: Ability to maintain appropriate glucose levels will improve Outcome: Progressing   Problem: Nutritional: Goal: Maintenance of adequate nutrition will improve Outcome: Progressing Goal: Progress toward achieving an optimal weight will improve Outcome: Progressing   Problem: Skin Integrity: Goal: Risk for impaired skin integrity will decrease Outcome: Progressing   Problem: Tissue Perfusion: Goal: Adequacy of tissue perfusion will improve Outcome: Progressing   Problem: Education: Goal: Knowledge of General Education information will improve Description: Including pain rating scale, medication(s)/side effects and non-pharmacologic comfort measures Outcome: Progressing   Problem: Health Behavior/Discharge Planning: Goal: Ability to manage health-related needs will improve Outcome: Progressing   Problem: Clinical Measurements: Goal: Ability to maintain clinical measurements within normal limits will improve Outcome: Progressing Goal: Will remain free from infection Outcome: Progressing Goal: Diagnostic test results will improve Outcome: Progressing Goal: Respiratory complications will improve Outcome: Progressing Goal: Cardiovascular complication will  be avoided Outcome: Progressing   Problem: Activity: Goal: Risk for activity intolerance will decrease Outcome: Progressing   Problem: Nutrition: Goal: Adequate nutrition will be maintained Outcome: Progressing   Problem: Coping: Goal: Level of anxiety will decrease Outcome: Progressing   Problem: Elimination: Goal: Will not experience complications related to bowel motility Outcome: Progressing Goal: Will not experience complications related to urinary retention Outcome: Progressing   Problem: Pain Managment: Goal: General experience of comfort will improve and/or be controlled Outcome: Progressing   Problem: Safety: Goal: Ability to remain free from injury will improve Outcome: Progressing   Problem: Skin Integrity: Goal: Risk for impaired skin integrity will decrease Outcome: Progressing   Problem: Education: Goal: Ability to verbalize activity precautions or restrictions will improve Outcome: Progressing Goal: Knowledge of the prescribed therapeutic regimen will improve Outcome: Progressing Goal: Understanding of discharge needs will improve Outcome: Progressing   Problem: Activity: Goal: Ability to avoid complications of mobility impairment will improve Outcome: Progressing Goal: Ability to tolerate increased activity will improve Outcome: Progressing Goal: Will remain free from falls Outcome: Progressing   Problem: Bowel/Gastric: Goal: Gastrointestinal status for postoperative course will improve Outcome: Progressing   Problem: Clinical Measurements: Goal: Ability to maintain clinical measurements within normal limits will improve Outcome: Progressing Goal: Postoperative complications will be avoided or minimized Outcome: Progressing Goal: Diagnostic test results will improve Outcome: Progressing   Problem: Pain Management: Goal: Pain level will decrease Outcome: Progressing

## 2023-10-18 NOTE — Plan of Care (Signed)
  Problem: Consults Goal: RH GENERAL PATIENT EDUCATION Description: See Patient Education module for education specifics. Outcome: Progressing   

## 2023-10-19 ENCOUNTER — Inpatient Hospital Stay (HOSPITAL_COMMUNITY)

## 2023-10-19 DIAGNOSIS — G8250 Quadriplegia, unspecified: Secondary | ICD-10-CM

## 2023-10-19 DIAGNOSIS — G959 Disease of spinal cord, unspecified: Secondary | ICD-10-CM

## 2023-10-19 LAB — CBC WITH DIFFERENTIAL/PLATELET
Abs Immature Granulocytes: 0.08 10*3/uL — ABNORMAL HIGH (ref 0.00–0.07)
Basophils Absolute: 0 10*3/uL (ref 0.0–0.1)
Basophils Relative: 0 %
Eosinophils Absolute: 0 10*3/uL (ref 0.0–0.5)
Eosinophils Relative: 0 %
HCT: 28.1 % — ABNORMAL LOW (ref 39.0–52.0)
Hemoglobin: 9.3 g/dL — ABNORMAL LOW (ref 13.0–17.0)
Immature Granulocytes: 1 %
Lymphocytes Relative: 11 %
Lymphs Abs: 1.2 10*3/uL (ref 0.7–4.0)
MCH: 29 pg (ref 26.0–34.0)
MCHC: 33.1 g/dL (ref 30.0–36.0)
MCV: 87.5 fL (ref 80.0–100.0)
Monocytes Absolute: 0.8 10*3/uL (ref 0.1–1.0)
Monocytes Relative: 8 %
Neutro Abs: 8.4 10*3/uL — ABNORMAL HIGH (ref 1.7–7.7)
Neutrophils Relative %: 80 %
Platelets: 504 10*3/uL — ABNORMAL HIGH (ref 150–400)
RBC: 3.21 MIL/uL — ABNORMAL LOW (ref 4.22–5.81)
RDW: 13.8 % (ref 11.5–15.5)
WBC: 10.5 10*3/uL (ref 4.0–10.5)
nRBC: 0 % (ref 0.0–0.2)

## 2023-10-19 LAB — COMPREHENSIVE METABOLIC PANEL WITH GFR
ALT: 15 U/L (ref 0–44)
AST: 17 U/L (ref 15–41)
Albumin: 2.9 g/dL — ABNORMAL LOW (ref 3.5–5.0)
Alkaline Phosphatase: 58 U/L (ref 38–126)
Anion gap: 9 (ref 5–15)
BUN: 43 mg/dL — ABNORMAL HIGH (ref 8–23)
CO2: 25 mmol/L (ref 22–32)
Calcium: 8.7 mg/dL — ABNORMAL LOW (ref 8.9–10.3)
Chloride: 100 mmol/L (ref 98–111)
Creatinine, Ser: 1.5 mg/dL — ABNORMAL HIGH (ref 0.61–1.24)
GFR, Estimated: 46 mL/min — ABNORMAL LOW (ref >60)
Glucose, Bld: 206 mg/dL — ABNORMAL HIGH (ref 70–99)
Potassium: 4.6 mmol/L (ref 3.5–5.1)
Sodium: 134 mmol/L — ABNORMAL LOW (ref 135–145)
Total Bilirubin: 0.5 mg/dL (ref 0.0–1.2)
Total Protein: 6 g/dL — ABNORMAL LOW (ref 6.5–8.1)

## 2023-10-19 LAB — GLUCOSE, CAPILLARY
Glucose-Capillary: 168 mg/dL — ABNORMAL HIGH (ref 70–99)
Glucose-Capillary: 160 mg/dL — ABNORMAL HIGH (ref 70–99)
Glucose-Capillary: 226 mg/dL — ABNORMAL HIGH (ref 70–99)
Glucose-Capillary: 251 mg/dL — ABNORMAL HIGH (ref 70–99)
Glucose-Capillary: 229 mg/dL — ABNORMAL HIGH (ref 70–99)

## 2023-10-19 MED ORDER — SORBITOL 70 % SOLN
30.0000 mL | Freq: Once | Status: AC
  Administered 2023-10-19: 30 mL via ORAL
  Filled 2023-10-19: qty 30

## 2023-10-19 MED ORDER — BACLOFEN 5 MG HALF TABLET
5.0000 mg | ORAL_TABLET | Freq: Three times a day (TID) | ORAL | Status: DC
  Administered 2023-10-19 – 2023-10-26 (×22): 5 mg via ORAL
  Filled 2023-10-19 (×22): qty 1

## 2023-10-19 MED ORDER — MENTHOL 3 MG MT LOZG: 1.0000 | LOZENGE | OROMUCOSAL | Status: DC | PRN

## 2023-10-19 NOTE — Plan of Care (Signed)
  Problem: RH Balance Goal: LTG: Patient will maintain dynamic sitting balance (OT) Description: LTG:  Patient will maintain dynamic sitting balance with assistance during activities of daily living (OT) Flowsheets (Taken 10/19/2023 1217) LTG: Pt will maintain dynamic sitting balance during ADLs with: Independent with assistive device   Problem: Sit to Stand Goal: LTG:  Patient will perform sit to stand in prep for activites of daily living with assistance level (OT) Description: LTG:  Patient will perform sit to stand in prep for activites of daily living with assistance level (OT) Flowsheets (Taken 10/19/2023 1217) LTG: PT will perform sit to stand in prep for activites of daily living with assistance level: Minimal Assistance - Patient > 75%   Problem: RH Grooming Goal: LTG Patient will perform grooming w/assist,cues/equip (OT) Description: LTG: Patient will perform grooming with assist, with/without cues using equipment (OT) Flowsheets (Taken 10/19/2023 1217) LTG: Pt will perform grooming with assistance level of: Minimal Assistance - Patient > 75%   Problem: RH Bathing Goal: LTG Patient will bathe all body parts with assist levels (OT) Description: LTG: Patient will bathe all body parts with assist levels (OT) Flowsheets (Taken 10/19/2023 1217) LTG: Pt will perform bathing with assistance level/cueing: Minimal Assistance - Patient > 75%   Problem: RH Dressing Goal: LTG Patient will perform upper body dressing (OT) Description: LTG Patient will perform upper body dressing with assist, with/without cues (OT). Flowsheets (Taken 10/19/2023 1217) LTG: Pt will perform upper body dressing with assistance level of: Minimal Assistance - Patient > 75% Goal: LTG Patient will perform lower body dressing w/assist (OT) Description: LTG: Patient will perform lower body dressing with assist, with/without cues in positioning using equipment (OT) Flowsheets (Taken 10/19/2023 1217) LTG: Pt will perform  lower body dressing with assistance level of: Minimal Assistance - Patient > 75%   Problem: RH Toileting Goal: LTG Patient will perform toileting task (3/3 steps) with assistance level (OT) Description: LTG: Patient will perform toileting task (3/3 steps) with assistance level (OT)  Flowsheets (Taken 10/19/2023 1217) LTG: Pt will perform toileting task (3/3 steps) with assistance level: Minimal Assistance - Patient > 75%   Problem: RH Toilet Transfers Goal: LTG Patient will perform toilet transfers w/assist (OT) Description: LTG: Patient will perform toilet transfers with assist, with/without cues using equipment (OT) Flowsheets (Taken 10/19/2023 1217) LTG: Pt will perform toilet transfers with assistance level of: Minimal Assistance - Patient > 75%   Problem: RH Tub/Shower Transfers Goal: LTG Patient will perform tub/shower transfers w/assist (OT) Description: LTG: Patient will perform tub/shower transfers with assist, with/without cues using equipment (OT) Flowsheets (Taken 10/19/2023 1217) LTG: Pt will perform tub/shower stall transfers with assistance level of: Minimal Assistance - Patient > 75%

## 2023-10-19 NOTE — Progress Notes (Addendum)
 PROGRESS NOTE   Subjective/Complaints:  Per pt's nurse, foley was removed this AM- just now, so hasn't peed yet.  Back sore from bed  Usually has a BM every other day per pt however has been 3 days- denies severe constipation, but feels like needs to go.  Doesn't want meds this AM- we discussed and he agreed to doSorbitol if no BM by 3pm.   Still very tight with decreased movement in RLE ROS:  Pt denies SOB, abd pain, CP, N/V/C/D, and vision changes   Objective:   DG Chest 2 View Result Date: 10/18/2023 CLINICAL DATA:  Lung crackles.  Follow-up. EXAM: CHEST - 2 VIEW COMPARISON:  10/02/2023 FINDINGS: The cardiomediastinal contours are normal. The lungs are clear. Pulmonary vasculature is normal. No consolidation, pleural effusion, or pneumothorax. No acute osseous abnormalities are seen. IMPRESSION: No active cardiopulmonary disease. Electronically Signed   By: Chadwick Colonel M.D.   On: 10/18/2023 20:55   DG Shoulder Left Result Date: 10/18/2023 CLINICAL DATA:  Left shoulder pain. EXAM: LEFT SHOULDER - 2+ VIEW COMPARISON:  None Available. FINDINGS: There is no evidence of fracture or dislocation. Acromioclavicular and glenohumeral osteoarthritis. Subcortical cystic change in the lateral humeral head typically seen with rotator cuff arthropathy. Soft tissues are unremarkable. IMPRESSION: 1. Acromioclavicular and glenohumeral osteoarthritis. 2. Subcortical cystic change in the lateral humeral head typically seen with rotator cuff arthropathy. Electronically Signed   By: Chadwick Colonel M.D.   On: 10/18/2023 20:54   Recent Labs    10/19/23 0529  WBC 10.5  HGB 9.3*  HCT 28.1*  PLT 504*   Recent Labs    10/19/23 0529  NA 134*  K 4.6  CL 100  CO2 25  GLUCOSE 206*  BUN 43*  CREATININE 1.50*  CALCIUM  8.7*    Intake/Output Summary (Last 24 hours) at 10/19/2023 4098 Last data filed at 10/19/2023 0800 Gross per 24 hour   Intake 118 ml  Output 1125 ml  Net -1007 ml        Physical Exam: Vital Signs Blood pressure 136/67, pulse 89, temperature (!) 97.4 F (36.3 C), resp. rate 19, height 5\' 4"  (1.626 m), weight 60.6 kg, SpO2 97%.   General: awake, alert, appropriate, sitting up in bed; lights were off; sleepy;  NAD HENT: conjugate gaze; oropharynx moist CV: regular rate and rhythm; no JVD Pulmonary: CTA B/L; no W/R/R- not coarse-  GI: soft, NT, ND, (+)BS- hypoactive Psychiatric: appropriate but sleepy Neurological: Ox3 Spasticity- MAS of 1+ to 2 in Ue's this Am and 3 in RLE Neurologic: Cranial nerves II through XII intact, motor strength is 4/5 in bilateral  bicep, tricep, grip,and finger ext and hand intrinsics, 3- RIght delt and 4/5 left delt, 3- bilateral  hip flexor, 3- RIght and 4- left knee extensors, 4/5 bilateral ankle dorsiflexor and plantar flexor Sensory exam normal sensation to light touch and proprioception in bilateral upper and lower extremities Tone- 3+ a bilateral biceps triceps and BR, + Hoffman's bilateral 2/5 RIght 3+ left knee Also MAS 3 tone in RIght Knee flexors and extensors, MAS 1 at the left knee Clonus at bilateral ankles  Musculoskeletal: Full range of motion in all 4 extremities.  No joint swelling   Assessment/Plan: 1. Functional deficits which require 3+ hours per day of interdisciplinary therapy in a comprehensive inpatient rehab setting. Physiatrist is providing close team supervision and 24 hour management of active medical problems listed below. Physiatrist and rehab team continue to assess barriers to discharge/monitor patient progress toward functional and medical goals  Care Tool:  Bathing              Bathing assist       Upper Body Dressing/Undressing Upper body dressing        Upper body assist      Lower Body Dressing/Undressing Lower body dressing            Lower body assist       Toileting Toileting    Toileting assist Assist  for toileting: Total Assistance - Patient < 25%     Transfers Chair/bed transfer  Transfers assist     Chair/bed transfer assist level: Total Assistance - Patient < 25%     Locomotion Ambulation   Ambulation assist              Walk 10 feet activity   Assist           Walk 50 feet activity   Assist           Walk 150 feet activity   Assist           Walk 10 feet on uneven surface  activity   Assist           Wheelchair     Assist               Wheelchair 50 feet with 2 turns activity    Assist            Wheelchair 150 feet activity     Assist          Blood pressure 136/67, pulse 89, temperature (!) 97.4 F (36.3 C), resp. rate 19, height 5\' 4"  (1.626 m), weight 60.6 kg, SpO2 97%.  Medical Problem List and Plan: 1. 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  Antithrombotics: -DVT/anticoagulation:  Mechanical: Sequential compression devices, below knee Bilateral lower extremities             -antiplatelet therapy: N/A 3. Pain Management: tylenol  prn.  4. Mood/Behavior/Sleep: LCSW to follow up for evaluation and support when appropriate             --Delirium precautions--His mental status has cleared but will monitor for worsening with transfer to new unit .pt has not required prn meds for agitation in several days  Monitor sleep wake cycle             -antipsychotic agents: Change haldol  q 6 hrs prn to Seroquel  prn 5. Neuropsych/cognition: This patient is capable of making decisions on his own behalf. 6. Skin/Wound Care: Routine pressure relief measures. 7. Fluids/Electrolytes/Nutrition:  Monitor I/O. Check CMET in am 8. 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  Diabetes- A1c 8.0-  with peripheral neuropathy by hx only on Jardiance at home, use SSI in the hospital  11.  Spasticity affecting LEs> UE and R>L , aggressive ROM  with PT, consider oral meds   4/22- starting baclofen  5 mg TID- with his Cr 1.50- has been running 1.68 to 2.30 12.  PAD s/p SFA and Iliac stenting  13. CKD? With AKI  4/22- Pt's Cr down to 1.50 from 2.29 2 days ago and 1.68 yesterday- will monitor 2x/week to see if will reduce further or be labile.    I spent a total of  43  minutes on total care today- >50% coordination of care- due to  D/w nursing and pt about spasticity and bowels- also review of chart- to know pt- and review of labs, vitals and d/w nursing again about bladder/foley removal. Also team conference to determine length of stay   LOS: 1 days A FACE TO FACE EVALUATION WAS PERFORMED  Jadelyn Elks 10/19/2023, 8:23 AM

## 2023-10-19 NOTE — Progress Notes (Signed)
 Inpatient Rehabilitation Center Individual Statement of Services  Patient Name:  Christian Bailey  Date:  10/19/2023  Welcome to the Inpatient Rehabilitation Center.  Our goal is to provide you with an individualized program based on your diagnosis and situation, designed to meet your specific needs.  With this comprehensive rehabilitation program, you will be expected to participate in at least 3 hours of rehabilitation therapies Monday-Friday, with modified therapy programming on the weekends.  Your rehabilitation program will include the following services:  Physical Therapy (PT), Occupational Therapy (OT), Speech Therapy (ST), 24 hour per day rehabilitation nursing, Neuropsychology, Care Coordinator, Rehabilitation Medicine, Nutrition Services, and Pharmacy Services  Weekly team conferences will be held on Tuesday to discuss your progress.  Your Inpatient Rehabilitation Care Coordinator will talk with you frequently to get your input and to update you on team discussions.  Team conferences with you and your family in attendance may also be held.  Expected length of stay: 4 weeks  Overall anticipated outcome: min-CGA level  Depending on your progress and recovery, your program may change. Your Inpatient Rehabilitation Care Coordinator will coordinate services and will keep you informed of any changes. Your Inpatient Rehabilitation Care Coordinator's name and contact numbers are listed  below.  The following services may also be recommended but are not provided by the Inpatient Rehabilitation Center:   Home Health Rehabiltiation Services Outpatient Rehabilitation Services    Arrangements will be made to provide these services after discharge if needed.  Arrangements include referral to agencies that provide these services.  Your insurance has been verified to be:  Medicare & tricare Your primary doctor is:  Avva Ravisankar  Pertinent information will be shared with your doctor and your  insurance company.  Inpatient Rehabilitation Care Coordinator:  Adrianna Albee, [place] [phone] or Justine Oms  Information discussed with and copy given to patient by: Mardell Shade, 10/19/2023, 1:26 PM

## 2023-10-19 NOTE — Evaluation (Signed)
 Speech Language Pathology Assessment and Plan  Patient Details  Name: Christian Bailey MRN: [id number] Date of Birth: [birth year]  SLP Diagnosis: Dysphagia  Rehab Potential: Good ELOS: 4 weeks   Today's Date: 10/19/2023 SLP Individual Time: 5638-7564 SLP Individual Time Calculation (min): 55 min   Hospital Problem: Principal Problem:   Acute incomplete quadriplegia (HCC) Active Problems:   Cervical myelopathy (HCC)  Past Medical History:  Past Medical History:  Diagnosis Date   Anemia    Complication of anesthesia    Diabetes mellitus (HCC)    TYPE 2   GERD (gastroesophageal reflux disease)    History of blood transfusion    GI bleed   History of colon polyps    History of hiatal hernia    Hyperlipemia    Hypertension    Osteoarthritis    PAD (peripheral artery disease) (HCC)    a. stenting of his left common iliac artery >20 years ago. b. h/o LEIA stent and 2 stents to R SFA in 2011. c. 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 Noted during admission 04/2014 but upon review seems to be frequent finding for patient.   Stenosis of carotid artery    a. 50% right carotid stenosis by angiogram 04/2014.   Past Surgical History:  Past Surgical History:  Procedure Laterality Date   ABDOMINAL AORTOGRAM W/LOWER EXTREMITY N/A 01/27/2017   Procedure: Abdominal Aortogram w/Lower Extremity;  Surgeon: Margherita Shell, MD;  Location: [facility];  Service: Cardiovascular;  Laterality: N/A;   ABDOMINAL AORTOGRAM W/LOWER EXTREMITY N/A 06/08/2017   Procedure: ABDOMINAL AORTOGRAM W/LOWER EXTREMITY;  Surgeon: Margherita Shell, MD;  Location:  [facility];  Service: Cardiovascular;  Laterality: N/A;   ABDOMINAL AORTOGRAM W/LOWER EXTREMITY N/A 08/31/2017   Procedure: ABDOMINAL AORTOGRAM W/LOWER EXTREMITY;  Surgeon: Margherita Shell, MD;  Location: [facility];  Service: Cardiovascular;  Laterality: N/A;  rt. unilateral   ANGIOPLASTY / STENTING FEMORAL     ANGIOPLASTY / STENTING ILIAC     ANTERIOR CERVICAL CORPECTOMY N/A 10/12/2023   Procedure: ANTERIOR CERVICAL DECOMPRESSION VIA CORPECTOMY WITH RECONSTRUCTION INCLUDING ANTERIOR CERVICAL PLATING CERVICAL FIVE-SIX,CERVICAL SIX-SEVEN;  Surgeon: Elna Haggis, MD;  Location: [facility] OR;  Service: Neurosurgery;  Laterality: N/A;   AORTA - BILATERAL FEMORAL ARTERY BYPASS GRAFT N/A 07/06/2018   Procedure: AORTA BIFEMORAL BYPASS GRAFT USING 14X7MM X 40CM HEMASHIELD GOLD GRAFT;  Surgeon: Margherita Shell, MD;  Location: [facility] OR;  Service: Vascular;  Laterality: N/A;   CEREBRAL ANGIOGRAM N/A 05/14/2014   Procedure: CEREBRAL ANGIOGRAM;  Surgeon: Avanell Leigh, MD;  Location: [facility] CATH LAB;  Service: Cardiovascular;  Laterality: N/A;   COLONOSCOPY W/ POLYPECTOMY     ENDARTERECTOMY Right 09/08/2017   ENDARTERECTOMY FEMORAL Right 09/08/2017   Procedure: REDO RIGHT FEMORAL ENDARTECTOMY WITH PATCH ANGIOPLASTY.;  Surgeon: Margherita Shell, MD;  Location: [facility] OR;  Service: Vascular;  Laterality: Right;   EYE SURGERY Bilateral    cataract   FEMORAL ARTERY STENT Right 05/12/2010   Stented distally with a 6x100 Abbott absolute stent and proximally with a 6x60 Cook Zilver stent resulting in the reduction of the  proximal segment 80% and mid segment 60-70% to 0% residual, LEFT common femoral artery stented with a 7x3 Smart stent resulting in reduction of 90% stenosis to 0% residual   FEMORAL-POPLITEAL BYPASS GRAFT Right 09/18/2016   Procedure: BYPASS GRAFT FEMORAL-POPLITEAL ARTERY;  Surgeon: Margherita Shell, MD;  Location: [facility];  Service: Vascular;  Laterality: Right;   FEMORAL-POPLITEAL BYPASS GRAFT Right  09/08/2017   Procedure: REDO BYPASS GRAFT FEMORAL-POPLITEAL ARTERY;  Surgeon: Margherita Shell, MD;  Location: [facility];  Service: Vascular;  Laterality: Right;   FEMORAL-POPLITEAL BYPASS GRAFT Right 07/06/2018   Procedure: REVISION RIGHT FEMORAL TO POPLITEAL ARTERY BYPASS GRAFT;  Surgeon: Margherita Shell, MD;  Location: [facility];  Service: Vascular;  Laterality: Right;   IR CHOLANGIOGRAM EXISTING TUBE  08/04/2018   IR EXCHANGE BILIARY DRAIN  01/22/2019   IR PERC CHOLECYSTOSTOMY  07/19/2018   IR RADIOLOGIST EVAL & MGMT  08/31/2018   IR RADIOLOGIST EVAL & MGMT  01/17/2019   IR RADIOLOGIST EVAL & MGMT  02/01/2019   IR RADIOLOGIST EVAL & MGMT  02/08/2019   KNEE ARTHROSCOPY     left   LOWER EXTREMITY ANGIOGRAM N/A 05/14/2014   Procedure: LOWER EXTREMITY ANGIOGRAM;  Surgeon: Avanell Leigh, MD;  Location: [facility] CATH LAB;  Service: Cardiovascular;  Laterality: N/A;   LOWER EXTREMITY ANGIOGRAPHY N/A 09/21/2016   Procedure: Lower Extremity Angiography;  Surgeon: Arvil Lauber, MD;  Location: [facility] INVASIVE CV LAB;  Service: Cardiovascular;  Laterality: N/A;   PERIPHERAL VASCULAR ATHERECTOMY Right 01/27/2017   Procedure: PERIPHERAL VASCULAR ATHERECTOMY;  Surgeon: Margherita Shell, MD;  Location: [facility];  Service: Cardiovascular;  Laterality: Right;   PERIPHERAL VASCULAR BALLOON ANGIOPLASTY Right 06/08/2017   Procedure: PERIPHERAL VASCULAR BALLOON ANGIOPLASTY;  Surgeon: Margherita Shell, MD;  Location: [facility];  Service: Cardiovascular;  Laterality: Right;  common femoral and superficial femoral arteries   PERIPHERAL VASCULAR CATHETERIZATION N/A 04/20/2016   Procedure: Lower Extremity Intervention;  Surgeon: Avanell Leigh, MD;  Location: [facility] INVASIVE CV LAB;  Service: Cardiovascular;  Laterality: N/A;   PERIPHERAL VASCULAR INTERVENTION  08/31/2017   Procedure: PERIPHERAL VASCULAR INTERVENTION;  Surgeon: Margherita Shell, MD;  Location: [facility];  Service: Cardiovascular;;  REIA   REVERSE  SHOULDER ARTHROPLASTY Right 12/07/2016   REVERSE SHOULDER ARTHROPLASTY Right 12/07/2016   Procedure: REVERSE SHOULDER ARTHROPLASTY;  Surgeon: Winston Hawking, MD;  Location: [facility] OR;  Service: Orthopedics;  Laterality: Right;   ROTATOR CUFF REPAIR Right 2003   SFA Right 05/14/2014   PTA  OF RT SFA         DR BERRY    Assessment / Plan / Recommendation Clinical Impression  Christian Bailey is an 83 year old male with history fo T2DM, severe PAD s/p multiple vascular procedures, macular degeneration/retinal detachment, GIB, polyneuropathy, recurrent falls X anxiety/depression who was admitted with after a fall where he struck his head against the wall and had complaints of neck and back pain. He was found to have edematous changes due to acute ligamentous injury C2-C5, diffuse edema posterior cervical musculature, compression of spine at C5/6 and C6/7 due to DDD and ossification of posterior longitudinal ligament. The patient states his wife has been assisting with showering and bathing prior to admission.  He was walking with a walker prior to admission. He states that he has had difficulty moving the right shoulder since a reverse shoulder arthroplasty done several years ago by Dr. Brunilda Capra. The patient experienced postoperative delirium and  in fact had psychiatry consult on 10/11/2023. They recommended change from Seroquel  to Haldol . The patient had resolution of the symptoms and has not been using Haldol  for several days. Pt was admitted to CIR on 10/18/23.  Swallowing: Pt presents with a mild oropharyngeal dysphagia. Oral mechanism exam completed and unremarkable with pt having natural dentition, able to initiate strong cough and volitional swallow. Bedside swallow evaluation completed with pt alert and seated at ~50 degrees due to pt report of increasing pain with further elevation. Pt dependent for feeding thus SLP administered all trials. In one of two Dys 1 trials pt noted with wet vocal quality and multiple  swallows to clear however, no additional symptoms in other trials. Dysphagia 2 trials were unremarkable. Pt with delayed cough after swallow of D3 mixed consistency. In D3 cracker trials, pt demo prolonged mastication however good use of lingual sweeps. Pt with trace to no lingual residue post swallow and no s/s aspiration. Pt consumed thin liquids intermittently throughout session including single and consecutive sips via straw. Pt without s/s asp in all trials. SLP recommending Dysphagia 2 solids and thin liquids with standard safe swallowing strategies. SLP recommending skilled intervention to address dysphagia and facilitate diet upgrade as appropriate.   Cognitive Linguistic: Cognition assessed informally with pt independently oriented to temporal, spatial, and situational concepts. Pt verbalized recent medical hx along with PLOF. Discussion with pt on perception of cognition with pt reporting he felt like he was at his baseline.  Pt's wife present throughout evaluation and corroborating information. She reports pt appears to be functioning at his baseline. No cognitive or language intervention warranted at this time.   Pt would benefit from skilled SLP services to maximize dysphagia in order to maximize his independence prior to discharge. Anticipate pt will not require follow up speech services at discharge.   Skilled Therapeutic Interventions          BSE, informal assessment measures, and OME administered. Please see full report for additional details.      SLP Assessment  Patient will need skilled Speech Lanaguage Pathology Services during CIR admission    Recommendations  SLP Diet Recommendations: Dysphagia 2 (Fine chop);Thin Medication Administration: Whole meds with puree Supervision: Staff to assist with self feeding Compensations: Slow rate;Small sips/bites;Follow solids with liquid;Effortful swallow Postural Changes and/or Swallow Maneuvers: Seated upright 90 degrees;Upright 30-60 min  after meal Oral Care Recommendations: Oral care BID Patient destination: Home Follow up Recommendations: None Equipment Recommended: None recommended by SLP    SLP Frequency 1 to 3 out of 7 days   SLP Duration  SLP Intensity  SLP Treatment/Interventions 4 weeks  Minumum of 1-2 x/day, 30 to 90 minutes  Dysphagia/aspiration precaution training;Patient/family education    Pain Pain Assessment Pain Scale: 0-10 Pain Score: 2  Pain Type: Acute pain Pain Location: Neck Pain Descriptors / Indicators: Aching Pain Intervention(s): Repositioned  Prior Functioning Cognitive/Linguistic Baseline: Baseline deficits Type of Home: House  Lives With: Spouse Available Help at Discharge: Family;Available 24 hours/day Vocation: Retired  Architectural technologist Overall Cognitive Status: History of cognitive impairments - at baseline Arousal/Alertness: Awake/alert Orientation Level: Oriented to person;Oriented to place;Oriented to situation Year: 2025 Month: April Day of Week: Correct Memory: Impaired Memory Impairment: Decreased recall of new information;Decreased short term memory Awareness: Appears intact Problem Solving: Appears intact Safety/Judgment: Impaired  Comprehension Auditory Comprehension Overall Auditory Comprehension: Appears within functional limits for tasks assessed Expression Expression Primary Mode of Expression: Verbal Verbal Expression Overall Verbal Expression: Appears within functional  limits for tasks assessed Written Expression Dominant Hand: Right Oral Motor Oral Motor/Sensory Function Overall Oral Motor/Sensory Function: Within functional limits Motor Speech Overall Motor Speech: Appears within functional limits for tasks assessed  Care Tool Care Tool Cognition Ability to hear (with hearing aid or hearing appliances if normally used Ability to hear (with hearing aid or hearing appliances if normally used): 0. Adequate - no difficulty in normal  conservation, social interaction, listening to TV   Expression of Ideas and Wants Expression of Ideas and Wants: 3. Some difficulty - exhibits some difficulty with expressing needs and ideas (e.g, some words or finishing thoughts) or speech is not clear   Understanding Verbal and Non-Verbal Content Understanding Verbal and Non-Verbal Content: 4. Understands (complex and basic) - clear comprehension without cues or repetitions  Memory/Recall Ability Memory/Recall Ability : Current season;That he or she is in a hospital/hospital unit    Bedside Swallowing Assessment General Respiratory Status: Room air History of Recent Intubation: No Behavior/Cognition: Cooperative;Alert;Pleasant mood Oral Cavity - Dentition: Adequate natural dentition Self-Feeding Abilities: Needs assist;Needs set up Vision: Functional for self-feeding Patient Positioning: Partially reclined Volitional Cough: Strong Volitional Swallow: Able to elicit  Oral Care Assessment Oral Assessment  (WDL): Within Defined Limits Lips: Symmetrical Teeth: Intact (missing bilateral back molars) Tongue: Pink;Moist Mucous Membrane(s): Moist;Pink Saliva: Moist, saliva free flowing Level of Consciousness: Alert Is patient on any of following O2 devices?: None of the above Nutritional status: Dysphagia Oral Assessment Risk : High Risk Ice Chips Ice chips: Not tested Thin Liquid Thin Liquid: Within functional limits Presentation: Self Fed;Straw Nectar Thick Nectar Thick Liquid: Not tested Honey Thick Honey Thick Liquid: Not tested Puree Puree: Impaired Pharyngeal Phase Impairments: Wet Vocal Quality Solid Solid: Impaired Oral Phase Impairments: Impaired mastication Oral Phase Functional Implications: Prolonged oral transit Pharyngeal Phase Impairments: Throat Clearing - Delayed BSE Assessment Risk for Aspiration Impact on safety and function: Mild aspiration risk Other Related Risk Factors: History of GERD;Cognitive  impairment;Deconditioning  Short Term Goals: Week 1: SLP Short Term Goal 1 (Week 1): Patient will utilize swallowing compensatory strategies to reduce s/sx of aspiration during consumption of PO given min multimodal A SLP Short Term Goal 2 (Week 1): Pt will trial Dys 3 textures with SLP only demonstrating timely and functional oral phase of swallow provided min A cues  Refer to Care Plan for Long Term Goals  Recommendations for other services: None   Discharge Criteria: Patient will be discharged from SLP if patient refuses treatment 3 consecutive times without medical reason, if treatment goals not met, if there is a change in medical status, if patient makes no progress towards goals or if patient is discharged from hospital.  The above assessment, treatment plan, treatment alternatives and goals were discussed and mutually agreed upon: by patient  Adela Holter 10/19/2023, 4:17 PM

## 2023-10-19 NOTE — Plan of Care (Signed)
  Problem: RH Swallowing Goal: LTG Patient will consume least restrictive diet using compensatory strategies with assistance (SLP) Description: LTG:  Patient will consume least restrictive diet using compensatory strategies with assistance (SLP) Flowsheets (Taken 10/19/2023 1631) LTG: Pt Patient will consume least restrictive diet using compensatory strategies with assistance of (SLP): Modified Independent

## 2023-10-19 NOTE — Progress Notes (Signed)
 Lower extremity venous duplex completed. Please see CV Procedures for preliminary results.  Estanislao Heimlich, RVT 10/19/23 12:16 PM

## 2023-10-19 NOTE — Progress Notes (Signed)
 Inpatient Rehabilitation  Patient information reviewed and entered into eRehab system by Feliberto Gottron, M.A., CCC-SLP, Rehab Quality Coordinator.  Information including medical coding, functional ability and quality indicators will be reviewed and updated through discharge.

## 2023-10-19 NOTE — Discharge Instructions (Addendum)
 Inpatient Rehab Discharge Instructions  Christian Bailey Discharge date and time:    Activities/Precautions/ Functional Status: Activity: no lifting, driving, or strenuous exercise till cleared by MD Diet:  Wound Care: keep wound clean and dry Functional status:  ___ No restrictions     ___ Walk up steps independently ___ 24/7 supervision/assistance   ___ Walk up steps with assistance ___ Intermittent supervision/assistance  ___ Bathe/dress independently ___ Walk with walker     ___ Bathe/dress with assistance ___ Walk Independently    ___ Shower independently ___ Walk with assistance    ___ Shower with assistance ___ No alcohol      ___ Return to work/school ________  Special Instructions:    My questions have been answered and I understand these instructions. I will adhere to these goals and the provided educational materials after my discharge from the hospital.  Patient/Caregiver Signature _______________________________ Date __________  Clinician Signature _______________________________________ Date __________  Please bring this form and your medication list with you to all your follow-up doctor's appointments.

## 2023-10-19 NOTE — Evaluation (Signed)
 Occupational Therapy Assessment and Plan  Patient Details  Name: Christian Bailey MRN: [id number] Date of Birth: Oct 31, 1940  OT Diagnosis: abnormal posture, lumbago (low back pain), and muscle weakness (generalized) Rehab Potential: Rehab Potential (ACUTE ONLY): Good ELOS: 3.5-4 weeks   Today's Date: 10/19/2023 OT Individual Time: 7829-5621 OT Individual Time Calculation (min): 75 min     Hospital Problem: Principal Problem:   Acute incomplete quadriplegia (HCC) Active Problems:   Cervical myelopathy (HCC)   Past Medical History:  Past Medical History:  Diagnosis Date   Anemia    Complication of anesthesia    Diabetes mellitus (HCC)    TYPE 2   GERD (gastroesophageal reflux disease)    History of blood transfusion    GI bleed   History of colon polyps    History of hiatal hernia    Hyperlipemia    Hypertension    Osteoarthritis    PAD (peripheral artery disease) (HCC)    a. stenting of his left common iliac artery >20 years ago. b. h/o LEIA stent and 2 stents to R SFA in 2011. c. 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 Noted during admission 04/2014 but upon review seems to be frequent finding for patient.   Stenosis of carotid artery    a. 50% right carotid stenosis by angiogram 04/2014.   Past Surgical History:  Past Surgical History:  Procedure Laterality Date   ABDOMINAL AORTOGRAM W/LOWER EXTREMITY N/A 01/27/2017   Procedure: Abdominal Aortogram w/Lower Extremity;  Surgeon: Margherita Shell, MD;  Location: [facility];  Service: Cardiovascular;  Laterality: N/A;   ABDOMINAL AORTOGRAM W/LOWER EXTREMITY N/A 06/08/2017    Procedure: ABDOMINAL AORTOGRAM W/LOWER EXTREMITY;  Surgeon: Margherita Shell, MD;  Location: [facility];  Service: Cardiovascular;  Laterality: N/A;   ABDOMINAL AORTOGRAM W/LOWER EXTREMITY N/A 08/31/2017   Procedure: ABDOMINAL AORTOGRAM W/LOWER EXTREMITY;  Surgeon: Margherita Shell, MD;  Location: [facility];  Service: Cardiovascular;  Laterality: N/A;  rt. unilateral   ANGIOPLASTY / STENTING FEMORAL     ANGIOPLASTY / STENTING ILIAC     ANTERIOR CERVICAL CORPECTOMY N/A 10/12/2023   Procedure: ANTERIOR CERVICAL DECOMPRESSION VIA CORPECTOMY WITH RECONSTRUCTION INCLUDING ANTERIOR CERVICAL PLATING CERVICAL FIVE-SIX,CERVICAL SIX-SEVEN;  Surgeon: Elna Haggis, MD;  Location: [facility];  Service: Neurosurgery;  Laterality: N/A;   AORTA - BILATERAL FEMORAL ARTERY BYPASS GRAFT N/A 07/06/2018   Procedure: AORTA BIFEMORAL BYPASS GRAFT USING 14X7MM X 40CM HEMASHIELD GOLD GRAFT;  Surgeon: Margherita Shell, MD;  Location: [facility];  Service: Vascular;  Laterality: N/A;   CEREBRAL ANGIOGRAM N/A 05/14/2014   Procedure: CEREBRAL ANGIOGRAM;  Surgeon: Avanell Leigh, MD;  Location: [facility] CATH LAB;  Service: Cardiovascular;  Laterality: N/A;   COLONOSCOPY W/ POLYPECTOMY     ENDARTERECTOMY Right 09/08/2017   ENDARTERECTOMY FEMORAL Right 09/08/2017   Procedure: REDO RIGHT FEMORAL ENDARTECTOMY WITH PATCH ANGIOPLASTY.;  Surgeon: Margherita Shell, MD;  Location: [facility];  Service: Vascular;  Laterality: Right;   EYE SURGERY Bilateral    cataract   FEMORAL ARTERY STENT Right 05/12/2010   Stented distally with a 6x100 Abbott absolute stent  and proximally with a 6x60 Cook Zilver stent resulting in the reduction of the proximal segment 80% and mid segment 60-70% to 0% residual, LEFT common femoral artery stented with a 7x3 Smart stent resulting in reduction of 90% stenosis to 0% residual   FEMORAL-POPLITEAL BYPASS GRAFT Right 09/18/2016   Procedure: BYPASS GRAFT FEMORAL-POPLITEAL ARTERY;  Surgeon: Margherita Shell, MD;   [facility]: [facility];  Service: Vascular;  Laterality: Right;   FEMORAL-POPLITEAL BYPASS GRAFT Right 09/08/2017   Procedure: REDO BYPASS GRAFT FEMORAL-POPLITEAL ARTERY;  Surgeon: Margherita Shell, MD;  [facility]: [facility];  Service: Vascular;  Laterality: Right;   FEMORAL-POPLITEAL BYPASS GRAFT Right 07/06/2018   Procedure: REVISION RIGHT FEMORAL TO POPLITEAL ARTERY BYPASS GRAFT;  Surgeon: Margherita Shell, MD;  [facility]: [facility];  Service: Vascular;  Laterality: Right;   IR CHOLANGIOGRAM EXISTING TUBE  08/04/2018   IR EXCHANGE BILIARY DRAIN  01/22/2019   IR PERC CHOLECYSTOSTOMY  07/19/2018   IR RADIOLOGIST EVAL & MGMT  08/31/2018   IR RADIOLOGIST EVAL & MGMT  01/17/2019   IR RADIOLOGIST EVAL & MGMT  02/01/2019   IR RADIOLOGIST EVAL & MGMT  02/08/2019   KNEE ARTHROSCOPY     left   LOWER EXTREMITY ANGIOGRAM N/A 05/14/2014   Procedure: LOWER EXTREMITY ANGIOGRAM;  Surgeon: Avanell Leigh, MD;  Location: [facility] CATH LAB;  Service: Cardiovascular;  Laterality: N/A;   LOWER EXTREMITY ANGIOGRAPHY N/A 09/21/2016   Procedure: Lower Extremity Angiography;  Surgeon: Arvil Lauber, MD;  Location: [facility] INVASIVE CV LAB;  Service: Cardiovascular;  Laterality: N/A;   PERIPHERAL VASCULAR ATHERECTOMY Right 01/27/2017   Procedure: PERIPHERAL VASCULAR ATHERECTOMY;  Surgeon: Margherita Shell, MD;  [facility];  Service: Cardiovascular;  Laterality: Right;   PERIPHERAL VASCULAR BALLOON ANGIOPLASTY Right 06/08/2017   Procedure: PERIPHERAL VASCULAR BALLOON ANGIOPLASTY;  Surgeon: Margherita Shell, MD;  [facility]: [facility];  Service: Cardiovascular;  Laterality: Right;  common femoral and superficial femoral arteries   PERIPHERAL VASCULAR CATHETERIZATION N/A 04/20/2016   Procedure: Lower Extremity Intervention;  Surgeon: Avanell Leigh, MD;  Location: [facility] INVASIVE CV LAB;  Service: Cardiovascular;  Laterality: N/A;   PERIPHERAL VASCULAR INTERVENTION  08/31/2017   Procedure: PERIPHERAL VASCULAR INTERVENTION;  Surgeon:  Margherita Shell, MD;  [facility]: [facility];  Service: Cardiovascular;;  REIA   REVERSE SHOULDER ARTHROPLASTY Right 12/07/2016   REVERSE SHOULDER ARTHROPLASTY Right 12/07/2016   Procedure: REVERSE SHOULDER ARTHROPLASTY;  Surgeon: Winston Hawking, MD;  Location: [facility] OR;  Service: Orthopedics;  Laterality: Right;   ROTATOR CUFF REPAIR Right 2003   SFA Right 05/14/2014   PTA  OF RT SFA         DR BERRY    Assessment & Plan Clinical Impression: Zayaan Kozak is an 83 year old male with history fo T2DM, severe PAD s/p multiple vascular procedures, macular degeneration/retinal detachment, GIB, polyneuropathy, recurrent falls X anxiety/depression who was admitted with after a fall where he struck his head against the wall and had complaints of neck and back pain. He was found to have edematous changes due to acute ligamentous injury C2-C5, diffuse edema posterior cervical musculature, compression of spine at C5/6 and C6/7 due to DDD and ossification of posterior longitudinal ligament,   The patient states he has had onset of weakness around 6 months ago.  He has also noticed difficulty emptying his bladder.  His primary care physician saw him and started him on Myrbetriq .  He has not seen a urologist.  He has a history of diabetes with painful neuropathy and was managed with Jardiance at home.  He has required insulin  since hospitalization.  The patient states his wife has been assisting with showering and bathing prior to admission.  He was walking with a walker prior to admission. He states that he has had difficulty moving the right shoulder since a reverse shoulder arthroplasty done several years ago by Dr. Brunilda Capra. The patient experienced postoperative delirium and in fact had psychiatry consult on 10/11/2023.  They recommended change from Seroquel  to Haldol .  The patient had resolution of the symptoms and has not been using Haldol  for several days. Palliative care was consulted for goals of care.  She  was prior to surgery.  Limited DNR status.  Palliative signed off.  Patient transferred to CIR on 10/18/2023 .    Patient currently requires total with basic self-care skills secondary to muscle weakness and muscle joint tightness, decreased cardiorespiratoy endurance, impaired timing and sequencing, unbalanced muscle activation, and decreased coordination, and decreased sitting balance, decreased standing balance, decreased postural control, and decreased balance strategies.  Prior to hospitalization, patient could complete ADLs with min for bathing and dressing.  Patient will benefit from skilled intervention to decrease level of assist with basic self-care skills and increase independence with basic self-care skills prior to discharge home with care partner.  Anticipate patient will require 24 hour supervision and follow up home health.  OT - End of Session Activity Tolerance: Tolerates 30+ min activity with multiple rests Endurance Deficit: Yes OT Assessment Rehab Potential (ACUTE ONLY): Good OT Patient demonstrates impairments in the following area(s): Balance;Skin Integrity;Safety;Cognition;Endurance;Motor;Pain;Sensory OT Basic ADL's Functional Problem(s): Eating;Grooming;Bathing;Dressing;Toileting OT Transfers Functional Problem(s): Toilet;Tub/Shower OT Additional Impairment(s): Fuctional Use of Upper Extremity OT Plan OT Intensity: Minimum of 1-2 x/day, 45 to 90 minutes OT Frequency: 5 out of 7 days OT Duration/Estimated Length of Stay: 3.5-4 weeks OT Treatment/Interventions: Balance/vestibular training;Discharge planning;Pain management;Self Care/advanced ADL retraining;UE/LE Coordination activities;Therapeutic Activities;Cognitive remediation/compensation;Disease mangement/prevention;Functional mobility training;Patient/family education;Skin care/wound managment;Therapeutic Exercise;Visual/perceptual remediation/compensation;Community reintegration;DME/adaptive equipment  instruction;Neuromuscular re-education;Psychosocial support;Splinting/orthotics;UE/LE Strength taining/ROM;Wheelchair propulsion/positioning OT Self Feeding Anticipated Outcome(s): set-up OT Basic Self-Care Anticipated Outcome(s): min A OT Toileting Anticipated Outcome(s): min A OT Bathroom Transfers Anticipated Outcome(s): min A OT Recommendation Recommendations for Other Services: Therapeutic Recreation consult Therapeutic Recreation Interventions: Pet therapy Patient destination: Home Follow Up Recommendations: 24 hour supervision/assistance Equipment Recommended: To be determined   OT Evaluation Precautions/Restrictions  Precautions Precautions: Fall;Cervical Precaution/Restrictions Comments: decreased recall of precautions, not able to verbalize any when prompted Required Braces or Orthoses: Cervical Brace Cervical Brace: Soft collar Restrictions Weight Bearing Restrictions Per Provider Order: No General Chart Reviewed: Yes Response to Previous Treatment: Not applicable Family/Caregiver Present: No Pain Pain Assessment Pain Scale: 0-10 Pain Score: 0-No pain Home Living/Prior Functioning Home Living Family/patient expects to be discharged to:: Private residence Living Arrangements: Spouse/significant other Available Help at Discharge: Family, Available 24 hours/day Type of Home: House Home Access: Stairs to enter Secretary/administrator of Steps: 4 Entrance Stairs-Rails: Left Home Layout: Full bath on main level, Multi-level, Able to live on main level with bedroom/bathroom Bathroom Shower/Tub: Walk-in shower (does have shower seat, removeable shower head) Bathroom Toilet: Handicapped height Bathroom Accessibility: Yes  Lives With: Spouse IADL History Homemaking Responsibilities: No Current License: No (legally blind) Occupation: Retired Leisure and Hobbies: computer, watching videos Prior Function Level of Independence: Needs assistance with ADLs, Needs  assistance with gait, Requires assistive device for independence  Able to Take Stairs?: Yes (with wife providing CGA-supervision) Driving: No  Vocation: Retired Administrator, sports Baseline Vision/History: 2 Legally blind Ability to See in Adequate Light: 0 Adequate Patient Visual Report: No change from baseline Vision Assessment?: No apparent visual deficits Perception  Perception: Within Functional Limits Praxis Praxis: WFL Cognition Cognition Overall Cognitive Status: No family/caregiver present to determine baseline cognitive functioning Arousal/Alertness: Awake/alert Orientation Level: Person;Place;Situation Person: Oriented Place: Oriented Situation: Oriented Memory: Impaired Memory Impairment: Decreased recall of new information;Decreased short term memory Awareness: Appears intact Problem Solving: Appears intact Safety/Judgment: Impaired Brief Interview for Mental Status (BIMS) Repetition of Three Words (First Attempt): 3 Temporal Orientation: Year: Correct Temporal Orientation: Month: Accurate within 5 days Temporal Orientation: Day: Correct Recall: "Sock": Yes, no cue required Recall: "Blue": Yes, after cueing ("a color") Recall: "Bed": Yes, no cue required BIMS Summary Score: 14 Sensation Sensation Light Touch: Impaired Detail Peripheral sensation comments: Pt reports BLE neuropathy x several years, altered/diminished sensation in BIL feet, premorbid N/T in fingertips Light Touch Impaired Details: Impaired RUE;Impaired LUE;Impaired RLE;Impaired LLE Hot/Cold: Appears Intact Proprioception: Impaired by gross assessment Coordination Gross Motor Movements are Fluid and Coordinated: No Fine Motor Movements are Fluid and Coordinated: No Coordination and Movement Description: 2/2 injury and generalized weakness Motor  Motor Motor: Abnormal tone;Other (comment) Motor - Skilled Clinical Observations: generalized weakness and ROM deficits on BUE, BLE spasticity  Trunk/Postural  Assessment  Cervical Assessment Cervical Assessment: Within Functional Limits (cervical precautions) Thoracic Assessment Thoracic Assessment: Exceptions to WFL (kyphosis) Lumbar Assessment Lumbar Assessment: Within Functional Limits Postural Control Postural Control: Deficits on evaluation Righting Reactions: delayed Protective Responses: delayed Postural Limitations: inadequate  Balance Balance Balance Assessed: Yes Static Sitting Balance Static Sitting - Balance Support: Bilateral upper extremity supported;Feet supported Static Sitting - Level of Assistance: 3: Mod assist Dynamic Sitting Balance Dynamic Sitting - Balance Support: Bilateral upper extremity supported;Feet supported Dynamic Sitting - Level of Assistance: 3: Mod assist Sitting balance - Comments: posterior lean at EOB Static Standing Balance Static Standing - Balance Support: During functional activity Static Standing - Level of Assistance: 2: Max assist Dynamic Standing Balance Dynamic Standing - Balance Support: During functional activity Dynamic Standing - Level of Assistance: 2: Max assist Extremity/Trunk Assessment RUE Assessment RUE Assessment: Exceptions to [facility] Passive Range of Motion (PROM) Comments: shoulder flexion ~90*, wfl other joints Active Range of Motion (AROM) Comments: 50* shoulder flexion, elbow extension resting at ~20*, wfl all other joints, prior Rt shoulder injury General Strength Comments: 3-/5 overall LUE Assessment LUE Assessment: Exceptions to [facility] Active Range of Motion (AROM) Comments: shoulder flexion ~90*, wfl all other joints General Strength Comments: 3+/5 overall  Care Tool Care Tool Self Care Eating   Eating Assist Level: Maximal Assistance - Patient 25 - 49%    Oral Care    Oral Care Assist Level: Moderate Assistance - Patient 50 - 74%    Bathing         Assist Level: Dependent - Patient 0%    Upper Body Dressing(including orthotics)       Assist Level: Moderate  Assistance - Patient 50 - 74%    Lower Body Dressing (excluding footwear)     Assist for lower body dressing: Dependent - Patient 0%    Putting on/Taking off footwear     Assist for footwear: Dependent - Patient 0%       Care Tool Toileting Toileting activity   Assist for toileting: Dependent - Patient 0%     Care Tool Bed Mobility Roll left and right activity   Roll left and right assist level: Maximal  Assistance - Patient 25 - 49%    Sit to lying activity   Sit to lying assist level: Maximal Assistance - Patient 25 - 49%    Lying to sitting on side of bed activity   Lying to sitting on side of bed assist level: the ability to move from lying on the back to sitting on the side of the bed with no back support.: Maximal Assistance - Patient 25 - 49%     Care Tool Transfers Sit to stand transfer   Sit to stand assist level: Maximal Assistance - Patient 25 - 49%    Chair/bed transfer   Chair/bed transfer assist level: Maximal Assistance - Patient 25 - 49%     Toilet transfer   Assist Level: Maximal Assistance - Patient 24 - 49%     Care Tool Cognition  Expression of Ideas and Wants Expression of Ideas and Wants: 3. Some difficulty - exhibits some difficulty with expressing needs and ideas (e.g, some words or finishing thoughts) or speech is not clear  Understanding Verbal and Non-Verbal Content Understanding Verbal and Non-Verbal Content: 4. Understands (complex and basic) - clear comprehension without cues or repetitions   Memory/Recall Ability Memory/Recall Ability : Current season;Staff names and faces;That he or she is in a hospital/hospital unit   Refer to Care Plan for Long Term Goals  SHORT TERM GOAL WEEK 1 OT Short Term Goal 1 (Week 1): Pt will complete self feeding with Ae as necessary at Min A OT Short Term Goal 2 (Week 1): Pt will complete sit to stand with LRAD at Mod A in preparation for ADLs OT Short Term Goal 3 (Week 1): Pt will complete LB dressing at  Max A with AE as necessary  Recommendations for other services: Therapeutic Recreation  Pet therapy   Skilled Therapeutic Intervention ADL ADL Eating: Moderate assistance Grooming: Moderate assistance Where Assessed-Grooming: Wheelchair Upper Body Bathing: Maximal assistance Lower Body Bathing: Maximal assistance Upper Body Dressing: Moderate assistance Where Assessed-Upper Body Dressing: Wheelchair Lower Body Dressing: Dependent Toileting: Dependent Where Assessed-Toileting: Bed level Toilet Transfer: Dependent Toilet Transfer Method: Other (comment) (stedy) Tub/Shower Transfer: Unable to assess Tub/Shower Transfer Method: Unable to assess Film/video editor Method: Unable to assess Mobility  Bed Mobility Bed Mobility: Rolling Right;Rolling Left;Supine to Sit;Scooting to [facility] Rolling Right: Maximal Assistance - Patient 25-49% Rolling Left: Maximal Assistance - Patient 25-49% Supine to Sit: Maximal Assistance - Patient - Patient 25-49% Scooting to [facility] Fall: Maximal Assistance - Patient 25-49% Transfers Sit to Stand: Maximal Assistance - Patient 25-49% Stand to Sit: Maximal Assistance - Patient 25-49%  1:1 evaluation and treatment session initiated this date. OT roles, goals and purpose discussed with pt as well as therapy schedule. ADL completed this date with levels of assist listed above. Pt would benefit from skilled OT in IPR setting in order to maximize independence with ADLs upon D/C.    Discharge Criteria: Patient will be discharged from OT if patient refuses treatment 3 consecutive times without medical reason, if treatment goals not met, if there is a change in medical status, if patient makes no progress towards goals or if patient is discharged from hospital.  The above assessment, treatment plan, treatment alternatives and goals were discussed and mutually agreed upon: by patient  Nila Barth, OTD, OTR/L 10/19/2023, 12:32 PM

## 2023-10-19 NOTE — Progress Notes (Signed)
 Inpatient Rehabilitation Care Coordinator Assessment and Plan Patient Details  Name: Christian Bailey MRN: [id number] Date of Birth: April 08, 1941  Today's Date: 10/19/2023  Hospital Problems: Principal Problem:   Acute incomplete quadriplegia (HCC) Active Problems:   Cervical myelopathy (HCC)  Past Medical History:  Past Medical History:  Diagnosis Date   Anemia    Complication of anesthesia    Diabetes mellitus (HCC)    TYPE 2   GERD (gastroesophageal reflux disease)    History of blood transfusion    GI bleed   History of colon polyps    History of hiatal hernia    Hyperlipemia    Hypertension    Osteoarthritis    PAD (peripheral artery disease) (HCC)    a. stenting of his left common iliac artery >20 years ago. b. h/o LEIA stent and 2 stents to R SFA in 2011. c. 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 Noted during admission 04/2014 but upon review seems to be frequent finding for patient.   Stenosis of carotid artery    a. 50% right carotid stenosis by angiogram 04/2014.   Past Surgical History:  Past Surgical History:  Procedure Laterality Date   ABDOMINAL AORTOGRAM W/LOWER EXTREMITY N/A 01/27/2017   Procedure: Abdominal Aortogram w/Lower Extremity;  Surgeon: Margherita Shell, MD;  Location: [facility];  Service: Cardiovascular;  Laterality: N/A;   ABDOMINAL AORTOGRAM W/LOWER EXTREMITY N/A 06/08/2017   Procedure: ABDOMINAL AORTOGRAM W/LOWER EXTREMITY;  Surgeon: Margherita Shell, MD;  Location: [facility];  Service: Cardiovascular;  Laterality: N/A;   ABDOMINAL AORTOGRAM W/LOWER EXTREMITY N/A 08/31/2017   Procedure:  ABDOMINAL AORTOGRAM W/LOWER EXTREMITY;  Surgeon: Margherita Shell, MD;  Location: [facility];  Service: Cardiovascular;  Laterality: N/A;  rt. unilateral   ANGIOPLASTY / STENTING FEMORAL     ANGIOPLASTY / STENTING ILIAC     ANTERIOR CERVICAL CORPECTOMY N/A 10/12/2023   Procedure: ANTERIOR CERVICAL DECOMPRESSION VIA CORPECTOMY WITH RECONSTRUCTION INCLUDING ANTERIOR CERVICAL PLATING CERVICAL FIVE-SIX,CERVICAL SIX-SEVEN;  Surgeon: Elna Haggis, MD;  Location: [facility];  Service: Neurosurgery;  Laterality: N/A;   AORTA - BILATERAL FEMORAL ARTERY BYPASS GRAFT N/A 07/06/2018   Procedure: AORTA BIFEMORAL BYPASS GRAFT USING 14X7MM X 40CM HEMASHIELD GOLD GRAFT;  Surgeon: Margherita Shell, MD;  Location: [facility];  Service: Vascular;  Laterality: N/A;   CEREBRAL ANGIOGRAM N/A 05/14/2014   Procedure: CEREBRAL ANGIOGRAM;  Surgeon: Avanell Leigh, MD;  Location: [facility] CATH LAB;  Service: Cardiovascular;  Laterality: N/A;   COLONOSCOPY W/ POLYPECTOMY     ENDARTERECTOMY Right 09/08/2017   ENDARTERECTOMY FEMORAL Right 09/08/2017   Procedure: REDO RIGHT FEMORAL ENDARTECTOMY WITH PATCH ANGIOPLASTY.;  Surgeon: Margherita Shell, MD;  Location: [facility];  Service: Vascular;  Laterality: Right;   EYE SURGERY Bilateral    cataract   FEMORAL ARTERY STENT Right 05/12/2010   Stented distally with a 6x100 Abbott absolute stent and proximally with a 6x60 Cook Zilver stent resulting in the reduction of the proximal segment 80% and mid segment 60-70% to 0% residual, LEFT common femoral artery stented with a 7x3 Smart stent resulting in reduction of  90% stenosis to 0% residual   FEMORAL-POPLITEAL BYPASS GRAFT Right 09/18/2016   Procedure: BYPASS GRAFT FEMORAL-POPLITEAL ARTERY;  Surgeon: Margherita Shell, MD;  Location: [facility];  Service: Vascular;  Laterality: Right;   FEMORAL-POPLITEAL BYPASS GRAFT Right 09/08/2017   Procedure: REDO BYPASS GRAFT FEMORAL-POPLITEAL ARTERY;  Surgeon: Margherita Shell, MD;  Location: [facility];  Service:  Vascular;  Laterality: Right;   FEMORAL-POPLITEAL BYPASS GRAFT Right 07/06/2018   Procedure: REVISION RIGHT FEMORAL TO POPLITEAL ARTERY BYPASS GRAFT;  Surgeon: Margherita Shell, MD;  Location: [facility];  Service: Vascular;  Laterality: Right;   IR CHOLANGIOGRAM EXISTING TUBE  08/04/2018   IR EXCHANGE BILIARY DRAIN  01/22/2019   IR PERC CHOLECYSTOSTOMY  07/19/2018   IR RADIOLOGIST EVAL & MGMT  08/31/2018   IR RADIOLOGIST EVAL & MGMT  01/17/2019   IR RADIOLOGIST EVAL & MGMT  02/01/2019   IR RADIOLOGIST EVAL & MGMT  02/08/2019   KNEE ARTHROSCOPY     left   LOWER EXTREMITY ANGIOGRAM N/A 05/14/2014   Procedure: LOWER EXTREMITY ANGIOGRAM;  Surgeon: Avanell Leigh, MD;  Location: [facility] CATH LAB;  Service: Cardiovascular;  Laterality: N/A;   LOWER EXTREMITY ANGIOGRAPHY N/A 09/21/2016   Procedure: Lower Extremity Angiography;  Surgeon: Arvil Lauber, MD;  Location: [facility] INVASIVE CV LAB;  Service: Cardiovascular;  Laterality: N/A;   PERIPHERAL VASCULAR ATHERECTOMY Right 01/27/2017   Procedure: PERIPHERAL VASCULAR ATHERECTOMY;  Surgeon: Margherita Shell, MD;  [facility]: [facility];  Service: Cardiovascular;  Laterality: Right;   PERIPHERAL VASCULAR BALLOON ANGIOPLASTY Right 06/08/2017   Procedure: PERIPHERAL VASCULAR BALLOON ANGIOPLASTY;  Surgeon: Margherita Shell, MD;  Location: [facility];  Service: Cardiovascular;  Laterality: Right;  common femoral and superficial femoral arteries   PERIPHERAL VASCULAR CATHETERIZATION N/A 04/20/2016   Procedure: Lower Extremity Intervention;  Surgeon: Avanell Leigh, MD;  Location: [facility] INVASIVE CV LAB;  Service: Cardiovascular;  Laterality: N/A;   PERIPHERAL VASCULAR INTERVENTION  08/31/2017   Procedure: PERIPHERAL VASCULAR INTERVENTION;  Surgeon: Margherita Shell, MD;  Location: [facility];  Service: Cardiovascular;;  REIA   REVERSE SHOULDER ARTHROPLASTY Right 12/07/2016   REVERSE SHOULDER ARTHROPLASTY Right 12/07/2016   Procedure: REVERSE SHOULDER ARTHROPLASTY;   Surgeon: Winston Hawking, MD;  Location: [facility] OR;  Service: Orthopedics;  Laterality: Right;   ROTATOR CUFF REPAIR Right 2003   SFA Right 05/14/2014   PTA  OF RT SFA         DR BERRY   Social History:  reports that he quit smoking about 35 years ago. He started smoking about 60 years ago. He has never used smokeless tobacco. He reports current alcohol  use. He reports that he does not use drugs.  Family / Support Systems Marital Status: Married Patient Roles: Spouse, Parent, Other (Comment) (grandparent) Spouse/Significant Other: Herrietta [phone] Children: Daniel-son [phone] loca has another son in [place] Other Supports: Friends Anticipated Caregiver: Wife Ability/Limitations of Caregiver: Wife has been assisting him with ADL's prior to admission. According to pt she has hurt her knee and needs to be careful Caregiver Availability: 24/7 Family Dynamics: Close with wife and children, has recently lost their daughter in-law to cancer. Pt's wife is trying to assist their son with the children-8 & 70 yo.  Social History Preferred language: English Religion: Catholic Cultural Background: NA Education: Some Charity fundraiser - How often do you need to have someone help you when you read instructions, pamphlets, or other written material from your doctor or pharmacy?:  Never Writes: Yes Employment Status: Retired Marine scientist Issues: NA Guardian/Conservator: None-according to MD pt is capable of making his own decisions while here. Wife is here daily and will include her in any decisions per pt's request   Abuse/Neglect Abuse/Neglect Assessment Can Be Completed: Yes Physical Abuse: Denies Verbal Abuse: Denies Sexual Abuse: Denies Exploitation of patient/patient's resources: Denies Self-Neglect: Denies  Patient response to: Social Isolation - How often do you feel lonely or isolated from those around you?: Never  Emotional Status Pt's affect, behavior and  adjustment status: Pt is motivated to improve and get better with his balance which has been an issues for awhile now. He reports his balance has been poor and he hopes with the surgery it will get better while here. Aware being efaluated today and goals being set for stay here Recent Psychosocial Issues: other health issues his rotator cuff issue which limits his movement in his left arm Psychiatric History: Hx-depression/anxiety takes medications for this and finds them helpful. Will ask neuro-psych to see while here for coping Substance Abuse History: NA  Patient / Family Perceptions, Expectations & Goals Pt/Family understanding of illness & functional limitations: Pt is able to explain his surgery and his limitations, he and wife talk with the MD's involved and feel understand his plan moving forward. He is hopeful he will do well here Premorbid pt/family roles/activities: husband, father, grandfather, retiree, chch member, friend Anticipated changes in roles/activities/participation: resume Pt/family expectations/goals: Pt states: " I hope to do well and my balance get better now I have had surgery."  [employer]: Other (Comment) (OP at [facility]) Premorbid Home Care/DME Agencies: Other (Comment) (rollator, rw, tub seat) Transportation available at discharge: wife Is the patient able to respond to transportation needs?: Yes In the past 12 months, has lack of transportation kept you from medical appointments or from getting medications?: No In the past 12 months, has lack of transportation kept you from meetings, work, or from getting things needed for daily living?: No Resource referrals recommended: Neuropsychology  Discharge Planning Living Arrangements: Spouse/significant other Support Systems: Spouse/significant other, Children, Friends/neighbors, Psychologist, clinical community Type of Residence: Private residence Insurance Resources: [employer], Media planner  (specify) Biomedical engineer) Financial Resources: Restaurant manager, fast food Screen Referred: No Living Expenses: Own Money Management: Patient, Spouse Does the patient have any problems obtaining your medications?: No Home Management: wife Patient/Family Preliminary Plans: Return home with wife who has been assisting him with his ADL's due to shoulder issue. Aware being evaluated today and goals being set for stay here. Awaiting therapy evaluations Care Coordinator Anticipated Follow Up Needs: HH/OP  Clinical Impression Pleasant gentleman who is motivated to try hard and recover from his surgery and hopefully get his balance better than it was prior to surgery. He continues to grieve his daughter in-law and will ask neuro-psych to see while here. Will continue to work on discharge needs. Awaiting therapy evaluations.  Mardell Shade 10/19/2023, 1:24 PM

## 2023-10-19 NOTE — Progress Notes (Signed)
 Met with patient to review current situation, team conference and plan of care. Patient claims his vision is not good and has a hard time feeding himself due to past rotator cuff problem. Staff notified that patient is total assist/ supervision with feeds. Continue to follow along to provide educational needs to facilitate preparation for discharge

## 2023-10-19 NOTE — Progress Notes (Signed)
 Physical Therapy Assessment and Plan  Patient Details  Name: Christian Bailey MRN: [id number] Date of Birth: 10-12-1940  PT Diagnosis: Difficulty walking, Impaired sensation, Muscle spasms, Muscle weakness, and Quadriplegia Rehab Potential: Good ELOS: 4 weeks   Today's Date: 10/19/2023 PT Individual Time: 4098-1191 PT Individual Time Calculation (min): 75 min    Hospital Problem: Principal Problem:   Acute incomplete quadriplegia (HCC) Active Problems:   Cervical myelopathy (HCC)   Past Medical History:  Past Medical History:  Diagnosis Date   Anemia    Complication of anesthesia    Diabetes mellitus (HCC)    TYPE 2   GERD (gastroesophageal reflux disease)    History of blood transfusion    GI bleed   History of colon polyps    History of hiatal hernia    Hyperlipemia    Hypertension    Osteoarthritis    PAD (peripheral artery disease) (HCC)    a. stenting of his left common iliac artery >20 years ago. b. h/o LEIA stent and 2 stents to R SFA in 2011. c. 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 Noted during admission 04/2014 but upon review seems to be frequent finding for patient.   Stenosis of carotid artery    a. 50% right carotid stenosis by angiogram 04/2014.   Past Surgical History:  Past Surgical History:  Procedure Laterality Date   ABDOMINAL AORTOGRAM W/LOWER EXTREMITY N/A 01/27/2017   Procedure: Abdominal Aortogram w/Lower Extremity;  Surgeon: Margherita Shell, MD;  [facility]: [facility];  Service: Cardiovascular;  Laterality: N/A;   ABDOMINAL AORTOGRAM W/LOWER EXTREMITY N/A 06/08/2017   Procedure: ABDOMINAL  AORTOGRAM W/LOWER EXTREMITY;  Surgeon: Margherita Shell, MD;  Location: [facility];  Service: Cardiovascular;  Laterality: N/A;   ABDOMINAL AORTOGRAM W/LOWER EXTREMITY N/A 08/31/2017   Procedure: ABDOMINAL AORTOGRAM W/LOWER EXTREMITY;  Surgeon: Margherita Shell, MD;  Location: [facility];  Service: Cardiovascular;  Laterality: N/A;  rt. unilateral   ANGIOPLASTY / STENTING FEMORAL     ANGIOPLASTY / STENTING ILIAC     ANTERIOR CERVICAL CORPECTOMY N/A 10/12/2023   Procedure: ANTERIOR CERVICAL DECOMPRESSION VIA CORPECTOMY WITH RECONSTRUCTION INCLUDING ANTERIOR CERVICAL PLATING CERVICAL FIVE-SIX,CERVICAL SIX-SEVEN;  Surgeon: Elna Haggis, MD;  Location: [facility];  Service: Neurosurgery;  Laterality: N/A;   AORTA - BILATERAL FEMORAL ARTERY BYPASS GRAFT N/A 07/06/2018   Procedure: AORTA BIFEMORAL BYPASS GRAFT USING 14X7MM X 40CM HEMASHIELD GOLD GRAFT;  Surgeon: Margherita Shell, MD;  Location: [facility];  Service: Vascular;  Laterality: N/A;   CEREBRAL ANGIOGRAM N/A 05/14/2014   Procedure: CEREBRAL ANGIOGRAM;  Surgeon: Avanell Leigh, MD;  Location: [facility] CATH LAB;  Service: Cardiovascular;  Laterality: N/A;   COLONOSCOPY W/ POLYPECTOMY     ENDARTERECTOMY Right 09/08/2017   ENDARTERECTOMY FEMORAL Right 09/08/2017   Procedure: REDO RIGHT FEMORAL ENDARTECTOMY WITH PATCH ANGIOPLASTY.;  Surgeon: Margherita Shell, MD;  [facility]: [facility];  Service: Vascular;  Laterality: Right;   EYE SURGERY Bilateral    cataract   FEMORAL ARTERY STENT Right 05/12/2010   Stented distally with a 6x100 Abbott absolute stent and proximally with a 6x60  Cook Zilver stent resulting in the reduction of the proximal segment 80% and mid segment 60-70% to 0% residual, LEFT common femoral artery stented with a 7x3 Smart stent resulting in reduction of 90% stenosis to 0% residual   FEMORAL-POPLITEAL BYPASS GRAFT Right 09/18/2016   Procedure: BYPASS GRAFT FEMORAL-POPLITEAL ARTERY;  Surgeon: Margherita Shell, MD;  [facility];  Service:  Vascular;  Laterality: Right;   FEMORAL-POPLITEAL BYPASS GRAFT Right 09/08/2017   Procedure: REDO BYPASS GRAFT FEMORAL-POPLITEAL ARTERY;  Surgeon: Margherita Shell, MD;  [facility];  Service: Vascular;  Laterality: Right;   FEMORAL-POPLITEAL BYPASS GRAFT Right 07/06/2018   Procedure: REVISION RIGHT FEMORAL TO POPLITEAL ARTERY BYPASS GRAFT;  Surgeon: Margherita Shell, MD;  [facility];  Service: Vascular;  Laterality: Right;   IR CHOLANGIOGRAM EXISTING TUBE  08/04/2018   IR EXCHANGE BILIARY DRAIN  01/22/2019   IR PERC CHOLECYSTOSTOMY  07/19/2018   IR RADIOLOGIST EVAL & MGMT  08/31/2018   IR RADIOLOGIST EVAL & MGMT  01/17/2019   IR RADIOLOGIST EVAL & MGMT  02/01/2019   IR RADIOLOGIST EVAL & MGMT  02/08/2019   KNEE ARTHROSCOPY     left   LOWER EXTREMITY ANGIOGRAM N/A 05/14/2014   Procedure: LOWER EXTREMITY ANGIOGRAM;  Surgeon: Avanell Leigh, MD;  Location: [facility] CATH LAB;  Service: Cardiovascular;  Laterality: N/A;   LOWER EXTREMITY ANGIOGRAPHY N/A 09/21/2016   Procedure: Lower Extremity Angiography;  Surgeon: Arvil Lauber, MD;  Location: [facility] INVASIVE CV LAB;  Service: Cardiovascular;  Laterality: N/A;   PERIPHERAL VASCULAR ATHERECTOMY Right 01/27/2017   Procedure: PERIPHERAL VASCULAR ATHERECTOMY;  Surgeon: Margherita Shell, MD;  [facility];  Service: Cardiovascular;  Laterality: Right;   PERIPHERAL VASCULAR BALLOON ANGIOPLASTY Right 06/08/2017   Procedure: PERIPHERAL VASCULAR BALLOON ANGIOPLASTY;  Surgeon: Margherita Shell, MD;  [facility];  Service: Cardiovascular;  Laterality: Right;  common femoral and superficial femoral arteries   PERIPHERAL VASCULAR CATHETERIZATION N/A 04/20/2016   Procedure: Lower Extremity Intervention;  Surgeon: Avanell Leigh, MD;  Location: [facility] INVASIVE CV LAB;  Service: Cardiovascular;  Laterality: N/A;   PERIPHERAL VASCULAR INTERVENTION  08/31/2017   Procedure: PERIPHERAL VASCULAR INTERVENTION;  Surgeon: Margherita Shell, MD;  [facility];  Service: Cardiovascular;;  REIA   REVERSE SHOULDER ARTHROPLASTY Right 12/07/2016   REVERSE SHOULDER ARTHROPLASTY Right 12/07/2016   Procedure: REVERSE SHOULDER ARTHROPLASTY;  Surgeon: Winston Hawking, MD;  Location: [facility] OR;  Service: Orthopedics;  Laterality: Right;   ROTATOR CUFF REPAIR Right 2003   SFA Right 05/14/2014   PTA  OF RT SFA         DR BERRY    Assessment & Plan Clinical Impression: Jc Veron is an 83 year old male with history fo T2DM, severe PAD s/p multiple vascular procedures, macular degeneration/retinal detachment, GIB, polyneuropathy, recurrent falls X anxiety/depression who was admitted with after a fall where he struck his head against the wall and had complaints of neck and back pain. He was found to have edematous changes due to acute ligamentous injury C2-C5, diffuse edema posterior cervical musculature, compression of spine at C5/6 and C6/7 due to DDD and ossification of posterior longitudinal ligament,   The patient states he has had onset of weakness around 6 months ago.  He has also noticed difficulty emptying his bladder.  His primary care physician saw him and started him on Myrbetriq .  He has not seen a urologist.  He has a history  of diabetes with painful neuropathy and was managed with Jardiance at home.  He has required insulin  since hospitalization.  The patient states his wife has been assisting with showering and bathing prior to admission.  He was walking with a walker prior to admission. He states that he has had difficulty moving the right shoulder since a reverse shoulder arthroplasty done several years ago by Dr. Brunilda Capra. The patient experienced postoperative delirium and in fact had psychiatry consult on 10/11/2023.  They recommended change from Seroquel  to Haldol .  The patient had resolution of the symptoms and has not been using Haldol  for several days. Palliative care was consulted for goals of care.  She was prior to surgery.  Limited  DNR status.  Palliative signed off. Patient transferred to CIR on 10/18/2023 .   Patient currently requires max with mobility secondary to muscle weakness and muscle joint tightness, decreased cardiorespiratoy endurance, impaired timing and sequencing, abnormal tone, and decreased coordination, and decreased sitting balance, decreased standing balance, decreased postural control, decreased balance strategies, and difficulty maintaining precautions.  Prior to hospitalization, patient was modified independent  with mobility and lived with Spouse in a House home.  Home access is 4Stairs to enter.  Patient will benefit from skilled PT intervention to maximize safe functional mobility, minimize fall risk, and decrease caregiver burden for planned discharge home with 24 hour assist.  Anticipate patient will benefit from follow up [facility] at discharge.  PT - End of Session Activity Tolerance: Tolerates 10 - 20 min activity with multiple rests Endurance Deficit: Yes PT Assessment Rehab Potential (ACUTE/IP ONLY): Good PT Barriers to Discharge: Inaccessible home environment;Decreased caregiver support;Home environment access/layout;Neurogenic Bowel & Bladder PT Patient demonstrates impairments in the following area(s): Balance;Skin Integrity;Pain;Motor;Safety;Endurance PT Transfers Functional Problem(s): Bed Mobility;Bed to Chair;Car PT Locomotion Functional Problem(s): Ambulation;Stairs PT Plan PT Intensity: Minimum of 1-2 x/day ,45 to 90 minutes PT Frequency: 5 out of 7 days PT Duration Estimated Length of Stay: 4 weeks PT Treatment/Interventions: Ambulation/gait training;Cognitive remediation/compensation;Discharge planning;DME/adaptive equipment instruction;Functional mobility training;Pain management;Psychosocial support;Splinting/orthotics;Therapeutic Activities;UE/LE Strength taining/ROM;Visual/perceptual remediation/compensation;Wheelchair propulsion/positioning;UE/LE Coordination activities;Therapeutic  Exercise;Stair training;Patient/family education;Skin care/wound management;Balance/vestibular training;Community reintegration;Disease management/prevention;Neuromuscular re-education;Functional electrical stimulation PT Transfers Anticipated Outcome(s): CGA with LRAD PT Locomotion Anticipated Outcome(s): min a short distance gait vs therapeutic gait PT Recommendation Recommendations for Other Services: Neuropsych consult;Therapeutic Recreation consult Therapeutic Recreation Interventions: Stress management Follow Up Recommendations: None Patient destination: Home Equipment Recommended: To be determined Equipment Details: pt owns RW, likely w/c   PT Evaluation Precautions/Restrictions Precautions Precautions: Fall;Cervical Precaution/Restrictions Comments: decreased recall of precautions, not able to verbalize any when prompted Required Braces or Orthoses: Cervical Brace Cervical Brace: Soft collar Restrictions Weight Bearing Restrictions Per Provider Order: No General   Vital SignsTherapy Vitals Temp: 97.7 F (36.5 C) Temp Source: Oral Pulse Rate: 80 Resp: 16 BP: (!) 126/53 Patient Position (if appropriate): Lying Oxygen Therapy SpO2: 100 % O2 Device: Room Air  Pain Interference Pain Interference Pain Effect on Sleep: 1. Rarely or not at all Pain Interference with Therapy Activities: 1. Rarely or not at all Pain Interference with Day-to-Day Activities: 1. Rarely or not at all Home Living/Prior Functioning Home Living Living Arrangements: Spouse/significant other Available Help at Discharge: Family;Available 24 hours/day Type of Home: House Home Access: Stairs to enter [employer] of Steps: 4 Entrance Stairs-Rails: Left Home Layout: Full bath on main level;Multi-level;Able to live on main level with bedroom/bathroom  Lives With: Spouse Prior Function Level of Independence: Needs assistance with ADLs;Needs assistance with gait;Requires assistive device for  independence  Able to Take Stairs?: Yes (with wife providing CGA-supervision) Driving: No Vocation: Retired Vision/Perception  Vision - History Ability to See in Adequate Light: 0 Adequate Perception Perception: Within Functional Limits Praxis Praxis: WFL  Cognition Overall Cognitive Status: No family/caregiver present to determine baseline cognitive functioning Arousal/Alertness: Awake/alert Orientation Level: Oriented to person;Oriented to place;Oriented to situation Year: 2025 Month: April Day of Week: Correct Memory: Impaired Memory Impairment: Decreased recall of new information;Decreased short term memory Awareness: Appears intact Problem Solving: Appears intact Safety/Judgment: Impaired Sensation Sensation Light Touch: Impaired Detail Peripheral sensation comments: Pt reports BLE neuropathy x several years, altered/diminished sensation in BIL feet, premorbid N/T in fingertips Light Touch Impaired Details: Impaired RUE;Impaired LUE;Impaired RLE;Impaired LLE Hot/Cold: Appears Intact Proprioception: Impaired by gross assessment Coordination Gross Motor Movements are Fluid and Coordinated: No Fine Motor Movements are Fluid and Coordinated: No Coordination and Movement Description: 2/2 injury and generalized weakness Motor  Motor Motor: Abnormal tone;Other (comment) Motor - Skilled Clinical Observations: generalized weakness and ROM deficits on BUE, BLE spasticity   Trunk/Postural Assessment  Cervical Assessment Cervical Assessment: Within Functional Limits (cervical precautions) Thoracic Assessment Thoracic Assessment: Exceptions to WFL (kyphosis) Lumbar Assessment Lumbar Assessment: Within Functional Limits Postural Control Postural Control: Deficits on evaluation Righting Reactions: delayed Protective Responses: delayed Postural Limitations: inadequate  Balance Balance Balance Assessed: Yes Static Sitting Balance Static Sitting - Balance Support: Bilateral  upper extremity supported;Feet supported Static Sitting - Level of Assistance: 3: Mod assist Dynamic Sitting Balance Dynamic Sitting - Balance Support: Bilateral upper extremity supported;Feet supported Dynamic Sitting - Level of Assistance: 3: Mod assist Sitting balance - Comments: posterior lean at EOB Static Standing Balance Static Standing - Balance Support: During functional activity Static Standing - Level of Assistance: 2: Max assist Dynamic Standing Balance Dynamic Standing - Balance Support: During functional activity Dynamic Standing - Level of Assistance: 2: Max assist Extremity Assessment  RUE Assessment RUE Assessment: Exceptions to [facility] Passive Range of Motion (PROM) Comments: shoulder flexion ~90*, wfl other joints Active Range of Motion (AROM) Comments: 50* shoulder flexion, elbow extension resting at ~20*, wfl all other joints, prior Rt shoulder injury General Strength Comments: 3-/5 overall LUE Assessment LUE Assessment: Exceptions to [facility] Active Range of Motion (AROM) Comments: shoulder flexion ~90*, wfl all other joints General Strength Comments: 3+/5 overall      Care Tool Care Tool Bed Mobility Roll left and right activity   Roll left and right assist level: Maximal Assistance - Patient 25 - 49%    Sit to lying activity   Sit to lying assist level: Maximal Assistance - Patient 25 - 49%    Lying to sitting on side of bed activity   Lying to sitting on side of bed assist level: the ability to move from lying on the back to sitting on the side of the bed with no back support.: Maximal Assistance - Patient 25 - 49%     Care Tool Transfers Sit to stand transfer   Sit to stand assist level: Maximal Assistance - Patient 25 - 49%    Chair/bed transfer   Chair/bed transfer assist level: Maximal Assistance - Patient 25 - 49%    Car transfer Car transfer activity did not occur: Safety/medical concerns        Care Tool Locomotion Ambulation Ambulation activity  did not occur: Safety/medical concerns        Walk 10 feet activity Walk 10 feet activity did not occur: Safety/medical concerns       Walk  50 feet with 2 turns activity Walk 50 feet with 2 turns activity did not occur: Safety/medical concerns      Walk 150 feet activity Walk 150 feet activity did not occur: Safety/medical concerns      Walk 10 feet on uneven surfaces activity Walk 10 feet on uneven surfaces activity did not occur: Safety/medical concerns      Stairs Stair activity did not occur: Safety/medical concerns        Walk up/down 1 step activity Walk up/down 1 step or curb (drop down) activity did not occur: Safety/medical concerns      Walk up/down 4 steps activity Walk up/down 4 steps activity did not occur: Safety/medical concerns      Walk up/down 12 steps activity Walk up/down 12 steps activity did not occur: Safety/medical concerns      Pick up small objects from floor Pick up small object from the floor (from standing position) activity did not occur: Safety/medical concerns      Wheelchair Is the patient using a wheelchair?: Yes Type of Wheelchair: Manual   Wheelchair assist level: Dependent - Patient 0% Max wheelchair distance: 150  Wheel 50 feet with 2 turns activity   Assist Level: Dependent - Patient 0%  Wheel 150 feet activity   Assist Level: Dependent - Patient 0%    Refer to Care Plan for Long Term Goals  SHORT TERM GOAL WEEK 1 PT Short Term Goal 1 (Week 1): Pt will initiate gait training PT Short Term Goal 2 (Week 1): Pt will perform STS with mod a consistently PT Short Term Goal 3 (Week 1): Pt will perform bed mobility with mod a  Recommendations for other services: Neuropsych and Therapeutic Recreation  Stress management  Skilled Therapeutic Intervention Evaluation completed (see details above) with patient education regarding purpose of PT evaluation, PT POC and goals, therapy schedule, weekly team meetings, and other CIR information  including safety plan and fall risk safety. Pt reports no pain during session. Pt performed the below functional mobility tasks with the specified levels of skilled cuing and assistance. Pt performed Sit to stand with stedy with max a. In // bars, he was able to perform Sit to stand with heavy mod a and max cueing, including knee block, hip extension assist, and cueing for retropulsion. He was limited by spasticity and retropulsion, but was able to correct with max VC/TC and facilitation. He was able to perform weight shifting and stepping in place with similar assist, demoes ataxia with stepping, L>R. Therapist also assisted with adjusting chair for comfort and padding bars of chair to minimize shoulder discomfort. Pt remained in chair at end of session, was left with all needs in reach and alarm active.   Mobility Bed Mobility Bed Mobility: Rolling Right;Rolling Left;Supine to Sit;Scooting to [facility] Rolling Right: Maximal Assistance - Patient 25-49% Rolling Left: Maximal Assistance - Patient 25-49% Supine to Sit: Maximal Assistance - Patient - Patient 25-49% Scooting to [facility]: Maximal Assistance - Patient 25-49% Transfers Transfers: Sit to Stand;Stand to Sit;Transfer;Transfer via Web designer to Stand: Maximal Assistance - Patient 25-49% Stand to Sit: Maximal Assistance - Patient 25-49% Transfer (Assistive device): Other (Comment) (stedy/parallel bars) Transfer via Lift Equipment: Engineering geologist: No Gait Gait: No Stairs / Additional Locomotion Stairs: No Wheelchair Mobility Wheelchair Mobility: No   Discharge Criteria: Patient will be discharged from PT if patient refuses treatment 3 consecutive times without medical reason, if treatment goals not met, if there is a change in medical  status, if patient makes no progress towards goals or if patient is discharged from hospital.  The above assessment, treatment plan, treatment alternatives and goals were discussed and  mutually agreed upon: by patient  [name] 10/19/2023, 2:44 PM

## 2023-10-20 DIAGNOSIS — G959 Disease of spinal cord, unspecified: Secondary | ICD-10-CM | POA: Diagnosis not present

## 2023-10-20 LAB — GLUCOSE, CAPILLARY
Glucose-Capillary: 260 mg/dL — ABNORMAL HIGH (ref 70–99)
Glucose-Capillary: 138 mg/dL — ABNORMAL HIGH (ref 70–99)
Glucose-Capillary: 254 mg/dL — ABNORMAL HIGH (ref 70–99)
Glucose-Capillary: 270 mg/dL — ABNORMAL HIGH (ref 70–99)

## 2023-10-20 MED ORDER — ENOXAPARIN SODIUM 40 MG/0.4ML IJ SOSY
40.0000 mg | PREFILLED_SYRINGE | INTRAMUSCULAR | Status: DC
  Administered 2023-10-20 – 2023-10-21 (×2): 40 mg via SUBCUTANEOUS
  Filled 2023-10-20 (×2): qty 0.4

## 2023-10-20 MED ORDER — TAMSULOSIN HCL 0.4 MG PO CAPS
0.4000 mg | ORAL_CAPSULE | Freq: Every day | ORAL | Status: DC
  Administered 2023-10-20 – 2023-11-23 (×35): 0.4 mg via ORAL
  Filled 2023-10-20 (×35): qty 1

## 2023-10-20 NOTE — Progress Notes (Signed)
 Orthopedic Tech Progress Note Patient Details:  Christian Bailey Jan 14, 1941 [id number]  Ortho Devices Type of Ortho Device: Prafo boot/shoe Ortho Device/Splint Location: BLE Ortho Device/Splint Interventions: Ordered, Application, Adjustment   Post Interventions Patient Tolerated: Well Instructions Provided: Adjustment of device  [name] OTR/L 10/20/2023, 7:28 AM

## 2023-10-20 NOTE — Progress Notes (Signed)
 Occupational Therapy Session Note  Patient Details  Name: Christian Bailey MRN: [id number] Date of Birth: Sep 25, 1940  Today's Date: 10/20/2023 OT Individual Time: 0905-1005 & 1300-1400 OT Individual Time Calculation (min): 60 min & 60 min   Short Term Goals: Week 1:  OT Short Term Goal 1 (Week 1): Pt will complete self feeding with Ae as necessary at Min A OT Short Term Goal 2 (Week 1): Pt will complete sit to stand with LRAD at Mod A in preparation for ADLs OT Short Term Goal 3 (Week 1): Pt will complete LB dressing at Max A with AE as necessary  Skilled Therapeutic Interventions/Progress Updates:      Therapy Documentation Precautions:  Precautions Precautions: Fall, Cervical Precaution/Restrictions Comments: decreased recall of precautions, not able to verbalize any when prompted Required Braces or Orthoses: Cervical Brace Cervical Brace: Soft collar Restrictions Weight Bearing Restrictions Per Provider Order: No Session 1 General: "Thank you!" Pt supine in bed upon OT arrival, agreeable to OT session.  Pain: no pain reported  ADL: OT providing skilled intervention on ADL retraining in order to increase independence with tasks and increase activity tolerance. Pt completed the following tasks at the current level of assist: Bed mobility: Max A, required AAROM for shifting BLE to EOB and trunk management, able to sit on EOB with SBA once feet planted on floor LB dressing: total A, pt reporting wife completed all aspects of LB dressing when at home prior to hospital stay Footwear: total A for slip on shoes and doffing PRAFOs, OT educated opt on purpose of PRAFOs d/t pt curiosity Transfers: Mod-Max A for stand pivot transfer from bed>W/C with RW, increased difficulty with LLE advancement, VC for wider BOS when stepping   Pt seated in W/C at end of session with W/C alarm donned, call light within reach and 4Ps assessed.    Session 2 General: "I feel good!" Pt seated in W/C upon  OT arrival, agreeable to OT.  Pain: no pain reported  Exercises: Pt completed the following exercise circuit in order to improve functional activity, strength and endurance to prepare for ADLs such as functional transfers. Pt completed the following exercises in seated position with no noted LOB/SOB and 1x10 repetitions on each exercise: -bicep curls -triceps extensions -knee flexion -knee extension with AAROM   Other Treatments:  OT providing adjustments to W/C head rest d/t slight soreness in neck for increased positioning and pressure relief. OT completed pressure relief throughout therapy session.   Pt supine in bed with bed alarm activated, 2 bed rails up, call light within reach and 4Ps assessed.   Therapy/Group: Individual Therapy  Nila Barth, OTD, OTR/L 10/20/2023, 4:25 PM

## 2023-10-20 NOTE — Progress Notes (Signed)
 Physical Therapy Session Note  Patient Details  Name: Christian Bailey MRN: [id number] Date of Birth: January 15, 1941  Today's Date: 10/20/2023 PT Individual Time: 1035-1104 PT Individual Time Calculation (min): 29 min   Short Term Goals: Week 1:  PT Short Term Goal 1 (Week 1): Pt will initiate gait training PT Short Term Goal 2 (Week 1): Pt will perform STS with mod a consistently PT Short Term Goal 3 (Week 1): Pt will perform bed mobility with mod a  Skilled Therapeutic Interventions/Progress Updates: Patient sitting in TIS on entrance to room. Patient alert and agreeable to PT session.   Patient reported 3/10 pain around incision site on neck (reported it was tolerable and did not need intervention at this time). Beginning of session focused on building pt rapport with pt stating he wasn't active prior to admission. PTA asked if pt did any yard work or walking with pt reporting he did yard work at home, and that his wife walks but he that it is not his thing. PTA provided education that yard work and doing stuff around the house is active, and that walking with his wife could be a great thing to do in the future one able to to improve cardiovascular flow and endurance. Further education that it is good that pt did some things to increase HR, including yard work.   Pt reported unrated pain in posterior aspect of neck. PTA noticed that pt head rest placed pt cervical spine slightly forward (head rest could not be adjusted any further back). PTA provided pillow placement behind B shoulders to improve pt's comfort sitting in  TIS with pt reporting decrease in pain following  Therapeutic Exercise: Pt performed the following exercises with therapist providing the described cuing and facilitation for improvement. - B LAQ x 10 with target cue to reach PTA hand (unable to reach full AROM in extension). Pt cued to control eccentric  - PTA passively moved LE into knee extension with pt cued to hold extension as  long as able (unable to hold without assistance), and control eccentric  Patient sitting in TIS at end of session with brakes locked, and all needs within reach. Communicated to nsg to obtain safety alarm belt as there was not one located in room.       Therapy Documentation Precautions:  Precautions Precautions: Fall, Cervical Precaution/Restrictions Comments: decreased recall of precautions, not able to verbalize any when prompted Required Braces or Orthoses: Cervical Brace Cervical Brace: Soft collar Restrictions Weight Bearing Restrictions Per Provider Order: No   Therapy/Group: Individual Therapy  Rhyker Silversmith PTA 10/20/2023, 12:08 PM

## 2023-10-20 NOTE — Progress Notes (Signed)
 Physical Therapy Session Note  Patient Details  Name: Christian Bailey MRN: [id number] Date of Birth: 09-21-1940  Today's Date: 10/20/2023 PT Individual Time: 1440-1530 PT Individual Time Calculation (min): 50 min   Short Term Goals: Week 1:  PT Short Term Goal 1 (Week 1): Pt will initiate gait training PT Short Term Goal 2 (Week 1): Pt will perform STS with mod a consistently PT Short Term Goal 3 (Week 1): Pt will perform bed mobility with mod a  Skilled Therapeutic Interventions/Progress Updates: Pt presented in bed sleeping but easily aroused and agreeable to therapy. Pt c/o increased shoulder pain, no intervention requested during session but increased towards end with nsg notified at end of session. Per info from OT pt noted to have increased tone/tightness in BLE therefore PTA performed manual stretching to BLE heel cords, hamstrings, and adductors 1 min ea x 3. PTA then obtained physioball to allow pt to perform knee to chest stretch. Nsg arrived at that time to perform I/O cath. Pt missed 10 min PT due to cathing. Once completed pt then performed KTC x 10 with assist from PTA to increase range. Completed supine to sit via sidelying with heavy modA to complete roll, and heavy modA for truncal support. Pt noted to have posterior lean upon sitting and required maxA for anterior scoot. Worked on reaching tasks L>RUE due to range restrictions for increased anterior lean. Pt was able to progress to unsupported sitting with close supervision with intermittent verbal cues to correct posterior lean. Pt attempted lateral scoots before returning to supine however unable to complete adequately therefore required max to near total A for x 3 lateral scoots to EOB. Performed sit to supine maxA for both truncal support and BLE management. Pt repositioned to comfort and left in bed at end of session with bed alarm on, call bell within reach and needs met.      Therapy Documentation Precautions:   Precautions Precautions: Fall, Cervical Precaution/Restrictions Comments: decreased recall of precautions, not able to verbalize any when prompted Required Braces or Orthoses: Cervical Brace Cervical Brace: Soft collar Restrictions Weight Bearing Restrictions Per Provider Order: No General: PT Amount of Missed Time (min): 10 Minutes PT Missed Treatment Reason: Nursing care Vital Signs: Therapy Vitals Temp: 98 F (36.7 C) Temp Source: Oral Pulse Rate: 70 Resp: 18 BP: 126/75 Patient Position (if appropriate): Lying Oxygen Therapy SpO2: 99 % O2 Device: Room Air  Therapy/Group: Individual Therapy  Kristalynn Coddington 10/20/2023, 4:05 PM

## 2023-10-20 NOTE — Patient Care Conference (Signed)
 Inpatient RehabilitationTeam Conference and Plan of Care Update Date: 10/19/2023   Time: 1133 a    Patient Name: Christian Bailey      Medical Record Number: [id number]  Date of Birth: April 24, 1941 Sex: Male         Room/Bed: 4M11C/4M11C-01 Payor Info: Payor: MEDICARE / Plan: MEDICARE PART A AND B / Product Type: *No Product type* /    Admit Date/Time:  10/18/2023  3:33 PM  Primary Diagnosis:  Acute incomplete quadriplegia [facility])  Hospital Problems: Principal Problem:   Acute incomplete quadriplegia (HCC) Active Problems:   Cervical myelopathy (HCC)    Expected Discharge Date: Expected Discharge Date:  (4 weeks)  Team Members Present: Physician leading conference: Dr. Celia Coles Social Worker Present: Adrianna Albee, LCSW Nurse Present: Jerene Monks, RN PT Present: Gita Lamb, PT OT Present: Nila Barth, OT     Current Status/Progress Goal Weekly Team Focus  Bowel/Bladder   with foley, to remove by 6am  Urinary retention remove foley  Trial voids  bowel and bladder training    Swallow/Nutrition/ Hydration               ADL's   total A overall, able to manipulate containers with BUE   Min A overall   self feeding, basic ADL retraining    Mobility   STS max with stedy, mod with //bars, limited by 1 to 1+ spasticity, retropulsion, and BLE weakness   CGA transfers and short distance gait  spasticity management, pain    Communication                Safety/Cognition/ Behavioral Observations               Pain   7/10 back pain   2/10 pain   deep breathing and couging exercises    Skin   intact   free from breakdown  turning and positioning every 2 hours      Discharge Planning:  New evaluation home with wife who was assisting prior to admission has hurt her knee though and dealing with recent daughter in-law's death from cancer    Team Discussion: Patient was admitted post C5-7 ACDF secondary to Traumatic incomplete quadriplegia R>L lower ext  weakness as primary neurologic deficits. Patient limited by pain , spasticity, weakness  of bilateral shoulder and poor balance of bilateral lower extremities.   Patient on target to meet rehab goals: yes, Patient requires total assistance with ADLs. Patient requires max assist with sit to stand using a stedy. Overall goals at discharge are set for min A -CGA.   *See Care Plan and progress notes for long and short-term goals.   Revisions to Treatment Plan:  Prafo boots  Bladder scan   Teaching Needs: Safety, medications, transfers, toileting, etc.   Current Barriers to Discharge: Decreased caregiver support, Neurogenic bowel and bladder, Weight bearing restrictions, and Behavior  Possible Resolutions to Barriers: Family Education      Medical Summary Current Status: foley out this AM- bladder scans q6 hours- chronic severe RTC B/L arthropathy; just lost dil to cancer; delirium is improving  Barriers to Discharge: Self-care education;Spasticity;Behavior/Mood;Medical stability;Uncontrolled Pain;Weight bearing restrictions;Neurogenic Bowel & Bladder  Barriers to Discharge Comments: COnstipation- B/L severe RTC arthropathy- cannot lift shoulders more than 30 degrees- significant pain- delirium- but is improving- spasticity- total A right now- foley just out- neurogenic bladder and probable bowel, poor balance Possible Resolutions to [employer] Focus: added baclofen  5 mg TID; sorbitol  for bowels-might need w/c level to go home;  d/c 4 weeks   Continued Need for Acute Rehabilitation Level of Care: The patient requires daily medical management by a physician with specialized training in physical medicine and rehabilitation for the following reasons: Direction of a multidisciplinary physical rehabilitation program to maximize functional independence : Yes Medical management of patient stability for increased activity during participation in an intensive rehabilitation regime.:  Yes Analysis of laboratory values and/or radiology reports with any subsequent need for medication adjustment and/or medical intervention. : Yes   I attest that I was present, lead the team conference, and concur with the assessment and plan of the team.   Jerene Monks 10/19/2023, 1133 am

## 2023-10-20 NOTE — Progress Notes (Signed)
 PROGRESS NOTE   Subjective/Complaints:  Pt required suppository last night- had a very large BM afterwards Also required cathing x2 overnight and some yesterday- not voiding yet.    ROS:   Pt denies SOB, abd pain, CP, N/V/C/D, and vision changes   Objective:   VAS US  LOWER EXTREMITY VENOUS (DVT) Result Date: 10/19/2023  Lower Venous DVT Study Patient Name:  Christian Bailey  Date of Exam:   10/19/2023 Medical Rec #: [id number]     Accession #:    [id number] Date of Birth: 06/09/1941     Patient Gender: M Patient Age:   83 years Exam Location:  [facility] Procedure:      VAS US  LOWER EXTREMITY VENOUS (DVT) Referring Phys: PAMELA LOVE --------------------------------------------------------------------------------  Indications: Immobility.  Risk Factors: Immobility Surgery Spinal surgery 10/11/23. Limitations: Scar tissue and arterial clacifications. Comparison Study: None. Performing Technologist: Estanislao Heimlich  Examination Guidelines: A complete evaluation includes B-mode imaging, spectral Doppler, color Doppler, and power Doppler as needed of all accessible portions of each vessel. Bilateral testing is considered an integral part of a complete examination. Limited examinations for reoccurring indications may be performed as noted. The reflux portion of the exam is performed with the patient in reverse Trendelenburg.  +---------+---------------+---------+-----------+----------+--------------+ RIGHT    CompressibilityPhasicitySpontaneityPropertiesThrombus Aging +---------+---------------+---------+-----------+----------+--------------+ CFV                                                   Obstructed     +---------+---------------+---------+-----------+----------+--------------+ SFJ                                                   Obstructed     +---------+---------------+---------+-----------+----------+--------------+  FV Prox  Full                                                        +---------+---------------+---------+-----------+----------+--------------+ FV Mid   Full                                                        +---------+---------------+---------+-----------+----------+--------------+ FV DistalFull                                                        +---------+---------------+---------+-----------+----------+--------------+ PFV      Full                                                        +---------+---------------+---------+-----------+----------+--------------+  POP      Full           Yes      Yes                                 +---------+---------------+---------+-----------+----------+--------------+ PTV      Full                    Yes                                 +---------+---------------+---------+-----------+----------+--------------+ PERO     Full                    Yes                                 +---------+---------------+---------+-----------+----------+--------------+   +---------+---------------+---------+-----------+----------+-------------------+ LEFT     CompressibilityPhasicitySpontaneityPropertiesThrombus Aging      +---------+---------------+---------+-----------+----------+-------------------+ CFV      Full           Yes      Yes                                      +---------+---------------+---------+-----------+----------+-------------------+ SFJ      Full                                                             +---------+---------------+---------+-----------+----------+-------------------+ FV Prox  Full                                                             +---------+---------------+---------+-----------+----------+-------------------+ FV Mid   Full                                                              +---------+---------------+---------+-----------+----------+-------------------+ FV DistalFull                    Yes                  Not well visualized +---------+---------------+---------+-----------+----------+-------------------+ PFV      Full                                                             +---------+---------------+---------+-----------+----------+-------------------+ POP      Full           Yes      Yes                                      +---------+---------------+---------+-----------+----------+-------------------+  PTV      Full                    Yes                                      +---------+---------------+---------+-----------+----------+-------------------+ PERO     Full                    Yes                                      +---------+---------------+---------+-----------+----------+-------------------+     Summary: BILATERAL: - No evidence of deep vein thrombosis seen in the lower extremities, bilaterally. -No evidence of popliteal cyst, bilaterally.   *See table(s) above for measurements and observations. Electronically signed by Angela Kell MD on 10/19/2023 at 5:11:46 PM.    Final    DG Chest 2 View Result Date: 10/18/2023 CLINICAL DATA:  Lung crackles.  Follow-up. EXAM: CHEST - 2 VIEW COMPARISON:  10/02/2023 FINDINGS: The cardiomediastinal contours are normal. The lungs are clear. Pulmonary vasculature is normal. No consolidation, pleural effusion, or pneumothorax. No acute osseous abnormalities are seen. IMPRESSION: No active cardiopulmonary disease. Electronically Signed   By: Chadwick Colonel M.D.   On: 10/18/2023 20:55   DG Shoulder Left Result Date: 10/18/2023 CLINICAL DATA:  Left shoulder pain. EXAM: LEFT SHOULDER - 2+ VIEW COMPARISON:  None Available. FINDINGS: There is no evidence of fracture or dislocation. Acromioclavicular and glenohumeral osteoarthritis. Subcortical cystic change in the lateral humeral head typically  seen with rotator cuff arthropathy. Soft tissues are unremarkable. IMPRESSION: 1. Acromioclavicular and glenohumeral osteoarthritis. 2. Subcortical cystic change in the lateral humeral head typically seen with rotator cuff arthropathy. Electronically Signed   By: Chadwick Colonel M.D.   On: 10/18/2023 20:54   Recent Labs    10/19/23 0529  WBC 10.5  HGB 9.3*  HCT 28.1*  PLT 504*   Recent Labs    10/19/23 0529  NA 134*  K 4.6  CL 100  CO2 25  GLUCOSE 206*  BUN 43*  CREATININE 1.50*  CALCIUM  8.7*    Intake/Output Summary (Last 24 hours) at 10/20/2023 0901 Last data filed at 10/20/2023 0800 Gross per 24 hour  Intake 476 ml  Output 2600 ml  Net -2124 ml        Physical Exam: Vital Signs Blood pressure 127/60, pulse 69, temperature 98 F (36.7 C), resp. rate 17, height 5\' 4"  (1.626 m), weight 60.6 kg, SpO2 96%.    General: awake, alert, appropriate, just woke up;  NAD HENT: conjugate gaze; oropharynx moist CV: regular rate and rhythm; no JVD Pulmonary: CTA B/L; no W/R/R- good air movement GI: soft, NT, ND, (+)BS Psychiatric: appropriate but sleepy Neurological: Ox3, but sleepy  Spasticity- MAS of 1+ to 2 in Ue's this Am and 3 in RLE- no change today Neurologic: Cranial nerves II through XII intact, motor strength is 4/5 in bilateral  bicep, tricep, grip,and finger ext and hand intrinsics, 3- RIght delt and 4/5 left delt, 3- bilateral  hip flexor, 3- RIght and 4- left knee extensors, 4/5 bilateral ankle dorsiflexor and plantar flexor Sensory exam normal sensation to light touch and proprioception in bilateral upper and lower extremities Tone- 3+ a bilateral biceps triceps and BR, + Hoffman's bilateral 2/5 RIght 3+  left knee Also MAS 3 tone in RIght Knee flexors and extensors, MAS 1 at the left knee Clonus at bilateral ankles  Musculoskeletal: Full range of motion in all 4 extremities. No joint swelling   Assessment/Plan: 1. Functional deficits which require 3+ hours  per day of interdisciplinary therapy in a comprehensive inpatient rehab setting. Physiatrist is providing close team supervision and 24 hour management of active medical problems listed below. Physiatrist and rehab team continue to assess barriers to discharge/monitor patient progress toward functional and medical goals  Care Tool:  Bathing              Bathing assist Assist Level: Dependent - Patient 0%     Upper Body Dressing/Undressing Upper body dressing        Upper body assist Assist Level: Moderate Assistance - Patient 50 - 74%    Lower Body Dressing/Undressing Lower body dressing            Lower body assist Assist for lower body dressing: Dependent - Patient 0%     Toileting Toileting    Toileting assist Assist for toileting: Dependent - Patient 0%     Transfers Chair/bed transfer  Transfers assist     Chair/bed transfer assist level: Maximal Assistance - Patient 25 - 49%     Locomotion Ambulation   Ambulation assist   Ambulation activity did not occur: Safety/medical concerns          Walk 10 feet activity   Assist  Walk 10 feet activity did not occur: Safety/medical concerns        Walk 50 feet activity   Assist Walk 50 feet with 2 turns activity did not occur: Safety/medical concerns         Walk 150 feet activity   Assist Walk 150 feet activity did not occur: Safety/medical concerns         Walk 10 feet on uneven surface  activity   Assist Walk 10 feet on uneven surfaces activity did not occur: Safety/medical concerns         Wheelchair     Assist Is the patient using a wheelchair?: Yes Type of Wheelchair: Manual    Wheelchair assist level: Dependent - Patient 0% Max wheelchair distance: 150    Wheelchair 50 feet with 2 turns activity    Assist        Assist Level: Dependent - Patient 0%   Wheelchair 150 feet activity     Assist      Assist Level: Dependent - Patient 0%    Blood pressure 127/60, pulse 69, temperature 98 F (36.7 C), resp. rate 17, height 5\' 4"  (1.626 m), weight 60.6 kg, SpO2 96%.  Medical Problem List and Plan: 1. Functional deficits secondary to Traumatic incomplete quadriplegia s/p C5-7 ACDF 10/12/23, has R>L lower ext weakness as primary neurologic deficits             -patient may  shower             -ELOS/Goals: Sup 14-16d  D/c ~ 4 weeks  Con't CIR PT and OT- as well as nursing for B/B  2.  Antithrombotics: -DVT/anticoagulation:  Mechanical: Sequential compression devices, below knee Bilateral lower extremities 4/23- will see if Dr Ellery Guthrie will allow us  to start Lovenox - he said yes, so will start.              -antiplatelet therapy: N/A 3. Pain Management: tylenol  prn.  4. Mood/Behavior/Sleep: LCSW to follow up for evaluation and support  when appropriate             --Delirium precautions--His mental status has cleared but will monitor for worsening with transfer to new unit .pt has not required prn meds for agitation in several days  Monitor sleep wake cycle             -antipsychotic agents: Change haldol  q 6 hrs prn to Seroquel  prn  4/23- no delirium so far 5. Neuropsych/cognition: This patient is capable of making decisions on his own behalf. 6. Skin/Wound Care: Routine pressure relief measures. 7. Fluids/Electrolytes/Nutrition:  Monitor I/O. Check CMET in am 8. 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  Urinary retention - Neurogenic bladder  d/t cervical myelopathy plus possible diabetic cystopathy , pt c/o poor stream and dribbling for ~82mo PTA, did not see a urologist,  Was on Myrbetriq , which may exacerbate overflow incont, consider d/c prior to voiding trial , place on flomax    4/22- Foley removed this AM- too soon to see if he will void or need in/out cath yet- will cath if volumes >350cc  4/23- Needed cathing at  least 2 overnight- will start Flomax  0.4 mg q supper to try and help pt void.  10.  Diabetes- A1c 8.0-  with peripheral neuropathy by hx only on Jardiance at home, use SSI in the hospital  11.  Spasticity affecting LEs> UE and R>L , aggressive ROM  with PT, consider oral meds   4/22- starting baclofen  5 mg TID- with his Cr 1.50- has been running 1.68 to 2.30 12.  PAD s/p SFA and Iliac stenting  13. CKD? With AKI  4/22- Pt's Cr down to 1.50 from 2.29 2 days ago and 1.68 yesterday- will monitor 2x/week to see if will reduce further or be labile.    I spent a total of 41   minutes on total care today- >50% coordination of care- due to  D/w nursing at length about needing suppository as well as bladder- needing caths- also review of vitals and therapy notes. Also called Dr Ellery Guthrie- to see if can do lovneox  LOS: 2 days A FACE TO FACE EVALUATION WAS PERFORMED  Tayden Nichelson 10/20/2023, 9:01 AM

## 2023-10-20 NOTE — Progress Notes (Signed)
 Recreational Therapy Session Note  Patient Details  Name: Christian Bailey MRN: [id number] Date of Birth: [birth year] Today's Date: 10/20/2023  Pain:no c/o  Pt participated in animal assisted activity w/c level with supervision.  Pt easily engaged with pet partner team, sharing jokes.  Pt appreciative of this visit.  Tasia Liz 10/20/2023, 3:32 PM

## 2023-10-21 DIAGNOSIS — G959 Disease of spinal cord, unspecified: Secondary | ICD-10-CM | POA: Diagnosis not present

## 2023-10-21 LAB — BASIC METABOLIC PANEL WITH GFR
Anion gap: 10 (ref 5–15)
BUN: 55 mg/dL — ABNORMAL HIGH (ref 8–23)
CO2: 27 mmol/L (ref 22–32)
Calcium: 8.8 mg/dL — ABNORMAL LOW (ref 8.9–10.3)
Chloride: 99 mmol/L (ref 98–111)
Creatinine, Ser: 1.84 mg/dL — ABNORMAL HIGH (ref 0.61–1.24)
GFR, Estimated: 36 mL/min — ABNORMAL LOW (ref >60)
Glucose, Bld: 172 mg/dL — ABNORMAL HIGH (ref 70–99)
Potassium: 4.4 mmol/L (ref 3.5–5.1)
Sodium: 136 mmol/L (ref 135–145)

## 2023-10-21 LAB — GLUCOSE, CAPILLARY
Glucose-Capillary: 172 mg/dL — ABNORMAL HIGH (ref 70–99)
Glucose-Capillary: 274 mg/dL — ABNORMAL HIGH (ref 70–99)
Glucose-Capillary: 210 mg/dL — ABNORMAL HIGH (ref 70–99)
Glucose-Capillary: 256 mg/dL — ABNORMAL HIGH (ref 70–99)

## 2023-10-21 MED ORDER — ENOXAPARIN SODIUM 30 MG/0.3ML IJ SOSY
30.0000 mg | PREFILLED_SYRINGE | INTRAMUSCULAR | Status: DC
  Administered 2023-10-22 – 2023-11-24 (×34): 30 mg via SUBCUTANEOUS
  Filled 2023-10-21 (×34): qty 0.3

## 2023-10-21 NOTE — IPOC Note (Signed)
 Overall Plan of Care [facility]) Patient Details Name: Christian Bailey MRN: [id number] DOB: [birth year]  Admitting Diagnosis: Acute incomplete quadriplegia [facility])  Hospital Problems: Principal Problem:   Acute incomplete quadriplegia (HCC) Active Problems:   Cervical myelopathy (HCC)     Functional Problem List: Nursing Pain, Bladder, Bowel, Safety, Endurance, Medication Management  PT Balance, Skin Integrity, Pain, Motor, Safety, Endurance  OT Balance, Skin Integrity, Safety, Cognition, Endurance, Motor, Pain, Sensory  SLP Nutrition  TR         Basic ADL's: OT Eating, Grooming, Bathing, Dressing, Toileting     Advanced  ADL's: OT       Transfers: PT Bed Mobility, Bed to Chair, Car  OT Toilet, Tub/Shower     Locomotion: PT Ambulation, Stairs     Additional Impairments: OT Fuctional Use of Upper Extremity  SLP Swallowing      TR      Anticipated Outcomes Item Anticipated Outcome  Self Feeding set-up  Swallowing  Mod I   Basic self-care  min A  Toileting  min A   Bathroom Transfers min A  Bowel/Bladder  manage bowel w mod I and bladder w toileting assistance  Transfers  CGA with LRAD  Locomotion  min a short distance gait vs therapeutic gait  Communication     Cognition     Pain  Pain < 4 with prns  Safety/Judgment  manage safety w cues   Therapy Plan: PT Intensity: Minimum of 1-2 x/day ,45 to 90 minutes PT Frequency: 5 out of 7 days PT Duration Estimated Length of Stay: 4 weeks OT Intensity: Minimum of 1-2 x/day, 45 to 90 minutes OT Frequency: 5 out of 7 days OT Duration/Estimated Length of Stay: 3.5-4 weeks SLP Intensity: Minumum of 1-2 x/day, 30 to 90 minutes SLP Frequency: 1 to 3 out of 7 days SLP Duration/Estimated Length of Stay: 4 weeks   Team Interventions: Nursing Interventions Patient/Family Education, Pain Management, Medication Management, Bladder Management, Bowel Management, Disease Management/Prevention, Discharge Planning, Cognitive  Remediation/Compensation  PT interventions Ambulation/gait training, Cognitive remediation/compensation, Discharge planning, DME/adaptive equipment instruction, Functional mobility training, Pain management, Psychosocial support, Splinting/orthotics, Therapeutic Activities, UE/LE Strength taining/ROM, Visual/perceptual remediation/compensation, Wheelchair propulsion/positioning, UE/LE Coordination activities, Therapeutic Exercise, Stair training, Patient/family education, Skin care/wound management, Warden/ranger, Community reintegration, Disease management/prevention, Neuromuscular re-education, Functional electrical stimulation  OT Interventions Warden/ranger, Discharge planning, Pain management, Self Care/advanced ADL retraining, UE/LE Coordination activities, Therapeutic Activities, Cognitive remediation/compensation, Disease mangement/prevention, Functional mobility training, Patient/family education, Skin care/wound managment, Therapeutic Exercise, Visual/perceptual remediation/compensation, Firefighter, Fish farm manager, Neuromuscular re-education, Psychosocial support, Splinting/orthotics, UE/LE Strength taining/ROM, Wheelchair propulsion/positioning  SLP Interventions Dysphagia/aspiration precaution training, Patient/family education  TR Interventions    SW/CM Interventions Discharge Planning, Psychosocial Support, Patient/Family Education   Barriers to Discharge MD  Medical stability, Home enviroment access/loayout, Incontinence, Neurogenic bowel and bladder, Wound care, Lack of/limited family support, and Weight bearing restrictions  Nursing Decreased caregiver support, Home environment access/layout 2 level 2 ste ramped entrymain B+B w spouse;patient's ambulation status has been deteriorating significantly over the last 6 months which resulted in this fall.wife recently has helped with sponge bath, typically pt gets in shower. Pt  recently fell putting on underwear while standing. Pt reports use of sock aid to donn socks  PT Inaccessible home environment, Decreased caregiver support, Home environment access/layout, Neurogenic Bowel & Bladder    OT      SLP      SW       Team Discharge Planning: Destination: PT-Home ,OT- Home ,  SLP-Home Projected Follow-up: PT-None, OT-  24 hour supervision/assistance, SLP-None Projected Equipment Needs: PT-To be determined, OT- To be determined, SLP-None recommended by SLP Equipment Details: PT-pt owns RW, likely w/c, OT-  Patient/family involved in discharge planning: PT- Patient,  OT-Patient, SLP-Patient, Family member/caregiver  MD ELOS: 4 weeks Medical Rehab Prognosis:  Good Assessment: The patient has been admitted for CIR therapies with the diagnosis of Incomplete quadriplegia. The team will be addressing functional mobility, strength, stamina, balance, safety, adaptive techniques and equipment, self-care, bowel and bladder mgt, patient and caregiver education, . Goals have been set at min A. Anticipated discharge destination is home with family.        See Team Conference Notes for weekly updates to the plan of care

## 2023-10-21 NOTE — Progress Notes (Signed)
 Physical Therapy Session Note  Patient Details  Name: Christian Bailey MRN: [id number] Date of Birth: Nov 16, 1940  Today's Date: 10/21/2023 PT Individual Time: 1302-1330 PT Individual Time Calculation (min): 28 min   Short Term Goals: Week 1:  PT Short Term Goal 1 (Week 1): Pt will initiate gait training PT Short Term Goal 2 (Week 1): Pt will perform STS with mod a consistently PT Short Term Goal 3 (Week 1): Pt will perform bed mobility with mod a  Skilled Therapeutic Interventions/Progress Updates:   Session focused on NMR in standing frame to work on postural control retraining, WB through BLE for spasticity management, and overall functional strengthening and upright tolerance. Pt requires 3 attempts to tolerate positioning in standing frame due to pain in RUE and L elbow (leaning forward for full support) demonstrating difficulty achieving full upright posture. Third attempt was able to stay in standing about 3 min but still reports pain in back and UE's, demonstrated impaired sensation/proprioception in trunk and BLE as difficulty noting where he was in space and what was actually touching him. Some glute activation and trunk extension noted in standing but does demonstrate tendency to maintain flexed posture and difficulty sustaining erect posture. End of session returned to room and all needs in reach.   Therapy Documentation Precautions:  Precautions Precautions: Fall, Cervical Precaution/Restrictions Comments: decreased recall of precautions, not able to verbalize any when prompted Required Braces or Orthoses: Cervical Brace Cervical Brace: Soft collar Restrictions Weight Bearing Restrictions Per Provider Order: No  Pain: Premedicated per report for pain in B shoulders and neck - repositioning throughout session.    Therapy/Group: Individual Therapy  Gita Lamb [name], PT, DPT, CBIS  10/21/2023, 2:48 PM

## 2023-10-21 NOTE — Plan of Care (Signed)
  Problem: Consults Goal: RH GENERAL PATIENT EDUCATION Description: See Patient Education module for education specifics. Outcome: Progressing   Problem: RH BOWEL ELIMINATION Goal: RH STG MANAGE BOWEL WITH ASSISTANCE Description: STG Manage Bowel with mod I  Assistance. Outcome: Progressing Goal: RH STG MANAGE BOWEL W/MEDICATION W/ASSISTANCE Description: STG Manage Bowel with Medication with mod I Assistance. Outcome: Progressing   Problem: RH BLADDER ELIMINATION Goal: RH STG MANAGE BLADDER WITH ASSISTANCE Description: STG Manage Bladder With toileting / mod I Assistance Outcome: Progressing Goal: RH STG MANAGE BLADDER WITH MEDICATION WITH ASSISTANCE Description: STG Manage Bladder With Medication With mod I Assistance. Outcome: Progressing   Problem: RH SAFETY Goal: RH STG ADHERE TO SAFETY PRECAUTIONS W/ASSISTANCE/DEVICE Description: STG Adhere to Safety Precautions With cues Assistance/Device. Outcome: Progressing   Problem: RH PAIN MANAGEMENT Goal: RH STG PAIN MANAGED AT OR BELOW PT'S PAIN GOAL Description: < 4 with prns Outcome: Progressing   Problem: RH KNOWLEDGE DEFICIT GENERAL Goal: RH STG INCREASE KNOWLEDGE OF SELF CARE AFTER HOSPITALIZATION Description: Patient and wife will be able to manage care at discharge using educational resources for medication, skin care,dietary modification and fall prevention independently Outcome: Progressing   Problem: Education: Goal: Ability to describe self-care measures that may prevent or decrease complications (Diabetes Survival Skills Education) will improve Outcome: Progressing Goal: Individualized Educational Video(s) Outcome: Progressing   Problem: Coping: Goal: Ability to adjust to condition or change in health will improve Outcome: Progressing   Problem: Fluid Volume: Goal: Ability to maintain a balanced intake and output will improve Outcome: Progressing   Problem: Health Behavior/Discharge Planning: Goal: Ability  to identify and utilize available resources and services will improve Outcome: Progressing Goal: Ability to manage health-related needs will improve Outcome: Progressing   Problem: Metabolic: Goal: Ability to maintain appropriate glucose levels will improve Outcome: Progressing   Problem: Nutritional: Goal: Maintenance of adequate nutrition will improve Outcome: Progressing Goal: Progress toward achieving an optimal weight will improve Outcome: Progressing   Problem: Skin Integrity: Goal: Risk for impaired skin integrity will decrease Outcome: Progressing   Problem: Tissue Perfusion: Goal: Adequacy of tissue perfusion will improve Outcome: Progressing

## 2023-10-21 NOTE — Evaluation (Signed)
 Recreational Therapy Assessment and Plan  Patient Details  Name: Christian Bailey MRN: [id number] Date of Birth: [birth year] Today's Date: 10/21/2023  Rehab Potential:  Good ELOS:   d/c 5/7 Assessment Hospital Problem: Principal Problem:   Acute incomplete quadriplegia (HCC) Active Problems:   Cervical myelopathy (HCC)     Past Medical History:      Past Medical History:  Diagnosis Date   Anemia     Complication of anesthesia     Diabetes mellitus (HCC)      TYPE 2   GERD (gastroesophageal reflux disease)     History of blood transfusion      GI bleed   History of colon polyps     History of hiatal hernia     Hyperlipemia     Hypertension     Osteoarthritis     PAD (peripheral artery disease) (HCC)      a. stenting of his left common iliac artery >20 years ago. b. h/o LEIA stent and 2 stents to R SFA in 2011. c. 04/2014:  s/p PTA of right SFA for in-stent restenosis, occluded left SFA   PONV (postoperative nausea and vomiting)      no porblem with the last 3 surgeries   RBBB (right bundle branch block with left anterior fascicular block)      NUCLEAR STRESS TEST, 08/18/2010 - no significant wall motion abnoramlities noted, post-stress EF 69%, normal myocardial perfusion study   Sinus tachycardia      a. Noted during admission 04/2014 but upon review seems to be frequent finding for patient.   Stenosis of carotid artery      a. 50% right carotid stenosis by angiogram 04/2014.        Past Surgical History:       Past Surgical History:  Procedure Laterality Date   ABDOMINAL AORTOGRAM W/LOWER EXTREMITY N/A 01/27/2017    Procedure: Abdominal Aortogram w/Lower Extremity;  Surgeon: Margherita Shell, MD;  Location: [facility];  Service: Cardiovascular;  Laterality: N/A;   ABDOMINAL AORTOGRAM W/LOWER EXTREMITY N/A 06/08/2017    Procedure: ABDOMINAL AORTOGRAM W/LOWER EXTREMITY;  Surgeon: Margherita Shell, MD;  Location: [facility];  Service: Cardiovascular;   Laterality: N/A;   ABDOMINAL AORTOGRAM W/LOWER EXTREMITY N/A 08/31/2017    Procedure: ABDOMINAL AORTOGRAM W/LOWER EXTREMITY;  Surgeon: Margherita Shell, MD;  Location: [facility];  Service: Cardiovascular;  Laterality: N/A;  rt. unilateral   ANGIOPLASTY / STENTING FEMORAL       ANGIOPLASTY / STENTING ILIAC       ANTERIOR CERVICAL CORPECTOMY N/A 10/12/2023    Procedure: ANTERIOR CERVICAL DECOMPRESSION VIA CORPECTOMY WITH RECONSTRUCTION INCLUDING ANTERIOR CERVICAL PLATING CERVICAL FIVE-SIX,CERVICAL SIX-SEVEN;  Surgeon: Elna Haggis, MD;  Location: [facility] OR;  Service: Neurosurgery;  Laterality: N/A;   AORTA - BILATERAL FEMORAL ARTERY BYPASS GRAFT N/A 07/06/2018    Procedure: AORTA BIFEMORAL BYPASS GRAFT USING 14X7MM X 40CM HEMASHIELD GOLD GRAFT;  Surgeon: Margherita Shell, MD;  Location: [facility] OR;  Service: Vascular;  Laterality: N/A;   CEREBRAL ANGIOGRAM N/A 05/14/2014    Procedure: CEREBRAL ANGIOGRAM;  Surgeon: Avanell Leigh, MD;  Location: [facility] CATH LAB;  Service: Cardiovascular;  Laterality: N/A;   COLONOSCOPY W/ POLYPECTOMY       ENDARTERECTOMY Right 09/08/2017   ENDARTERECTOMY FEMORAL Right 09/08/2017    Procedure: REDO RIGHT FEMORAL ENDARTECTOMY WITH PATCH ANGIOPLASTY.;  Surgeon: Margherita Shell, MD;  Location: [facility] OR;  Service: Vascular;  Laterality: Right;   EYE SURGERY  Bilateral      cataract   FEMORAL ARTERY STENT Right 05/12/2010    Stented distally with a 6x100 Abbott absolute stent and proximally with a 6x60 Cook Zilver stent resulting in the reduction of the proximal segment 80% and mid segment 60-70% to 0% residual, LEFT common femoral artery stented with a 7x3 Smart stent resulting in reduction of 90% stenosis to 0% residual   FEMORAL-POPLITEAL BYPASS GRAFT Right 09/18/2016    Procedure: BYPASS GRAFT FEMORAL-POPLITEAL ARTERY;  Surgeon: Margherita Shell, MD;  Location: [facility] OR;  Service: Vascular;  Laterality: Right;   FEMORAL-POPLITEAL BYPASS GRAFT Right 09/08/2017    Procedure: REDO  BYPASS GRAFT FEMORAL-POPLITEAL ARTERY;  Surgeon: Margherita Shell, MD;  Location: [facility] OR;  Service: Vascular;  Laterality: Right;   FEMORAL-POPLITEAL BYPASS GRAFT Right 07/06/2018    Procedure: REVISION RIGHT FEMORAL TO POPLITEAL ARTERY BYPASS GRAFT;  Surgeon: Margherita Shell, MD;  Location: [facility] OR;  Service: Vascular;  Laterality: Right;   IR CHOLANGIOGRAM EXISTING TUBE   08/04/2018   IR EXCHANGE BILIARY DRAIN   01/22/2019   IR PERC CHOLECYSTOSTOMY   07/19/2018   IR RADIOLOGIST EVAL & MGMT   08/31/2018   IR RADIOLOGIST EVAL & MGMT   01/17/2019   IR RADIOLOGIST EVAL & MGMT   02/01/2019   IR RADIOLOGIST EVAL & MGMT   02/08/2019   KNEE ARTHROSCOPY        left   LOWER EXTREMITY ANGIOGRAM N/A 05/14/2014    Procedure: LOWER EXTREMITY ANGIOGRAM;  Surgeon: Avanell Leigh, MD;  Location: [facility] CATH LAB;  Service: Cardiovascular;  Laterality: N/A;   LOWER EXTREMITY ANGIOGRAPHY N/A 09/21/2016    Procedure: Lower Extremity Angiography;  Surgeon: Arvil Lauber, MD;  Location: [facility] INVASIVE CV LAB;  Service: Cardiovascular;  Laterality: N/A;   PERIPHERAL VASCULAR ATHERECTOMY Right 01/27/2017    Procedure: PERIPHERAL VASCULAR ATHERECTOMY;  Surgeon: Margherita Shell, MD;  Location: [facility];  Service: Cardiovascular;  Laterality: Right;   PERIPHERAL VASCULAR BALLOON ANGIOPLASTY Right 06/08/2017    Procedure: PERIPHERAL VASCULAR BALLOON ANGIOPLASTY;  Surgeon: Margherita Shell, MD;  Location: [facility];  Service: Cardiovascular;  Laterality: Right;  common femoral and superficial femoral arteries   PERIPHERAL VASCULAR CATHETERIZATION N/A 04/20/2016    Procedure: Lower Extremity Intervention;  Surgeon: Avanell Leigh, MD;  Location: [facility] INVASIVE CV LAB;  Service: Cardiovascular;  Laterality: N/A;   PERIPHERAL VASCULAR INTERVENTION   08/31/2017    Procedure: PERIPHERAL VASCULAR INTERVENTION;  Surgeon: Margherita Shell, MD;  Location: [facility];  Service: Cardiovascular;;  REIA   REVERSE SHOULDER  ARTHROPLASTY Right 12/07/2016   REVERSE SHOULDER ARTHROPLASTY Right 12/07/2016    Procedure: REVERSE SHOULDER ARTHROPLASTY;  Surgeon: Winston Hawking, MD;  Location: [facility] OR;  Service: Orthopedics;  Laterality: Right;   ROTATOR CUFF REPAIR Right 2003   SFA Right 05/14/2014    PTA  OF RT SFA         DR BERRY          Assessment & Plan Clinical Impression: Ho Parisi is an 83 year old male with history fo T2DM, severe PAD s/p multiple vascular procedures, macular degeneration/retinal detachment, GIB, polyneuropathy, recurrent falls X anxiety/depression who was admitted with after a fall where he struck his head against the wall and had complaints of neck and back pain. He was found to have edematous changes due to acute ligamentous injury C2-C5, diffuse edema posterior cervical musculature, compression of spine at C5/6 and  C6/7 due to DDD and ossification of posterior longitudinal ligament,   The patient states he has had onset of weakness around 6 months ago.  He has also noticed difficulty emptying his bladder.  His primary care physician saw him and started him on Myrbetriq .  He has not seen a urologist.  He has a history of diabetes with painful neuropathy and was managed with Jardiance at home.  He has required insulin  since hospitalization.  The patient states his wife has been assisting with showering and bathing prior to admission.  He was walking with a walker prior to admission. He states that he has had difficulty moving the right shoulder since a reverse shoulder arthroplasty done several years ago by Dr. Brunilda Capra. The patient experienced postoperative delirium and in fact had psychiatry consult on 10/11/2023.  They recommended change from Seroquel  to Haldol .  The patient had resolution of the symptoms and has not been using Haldol  for several days. Palliative care was consulted for goals of care.  She was prior to surgery.  Limited DNR status.  Palliative signed off.  Patient transferred to CIR  on 10/18/2023 .    Pt presents with decreased activity tolerance, decreased functional mobility, decreased balance, decreased coordination, feelings of stress Limiting pt's independence with leisure/community pursuits.  Met with pt today to discuss TR services including leisure education, activity analysis/modifications and stress management.  Also discussed the importance of social, emotional, spiritual health in addition to physical health and their effects on overall health and wellness.  Pt stated understanding.  Plan  Min 1 TR session >20 minutes per week during LOS  Recommendations for other services: None   Discharge Criteria: Patient will be discharged from TR if patient refuses treatment 3 consecutive times without medical reason.  If treatment goals not met, if there is a change in medical status, if patient makes no progress towards goals or if patient is discharged from hospital.  The above assessment, treatment plan, treatment alternatives and goals were discussed and mutually agreed upon: by patient  Eswin Worrell 10/21/2023, 8:33 AM

## 2023-10-21 NOTE — Progress Notes (Signed)
 Occupational Therapy Session Note  Patient Details  Name: Christian Bailey MRN: [id number] Date of Birth: 1941/03/01  Today's Date: 10/21/2023 OT Individual Time: 9562-1308 OT Individual Time Calculation (min): 40 min    Short Term Goals: Week 1:  OT Short Term Goal 1 (Week 1): Pt will complete self feeding with Ae as necessary at Min A OT Short Term Goal 2 (Week 1): Pt will complete sit to stand with LRAD at Mod A in preparation for ADLs OT Short Term Goal 3 (Week 1): Pt will complete LB dressing at Max A with AE as necessary  Skilled Therapeutic Interventions/Progress Updates:      Therapy Documentation Precautions:  Precautions Precautions: Fall, Cervical Precaution/Restrictions Comments: decreased recall of precautions, not able to verbalize any when prompted Required Braces or Orthoses: Cervical Brace Cervical Brace: Soft collar Restrictions Weight Bearing Restrictions Per Provider Order: No General: "Have a great weekend!" Pt seated in W/C upon OT arrival, agreeable to OT.  Pain:  unrated soreness in neck, positioning, activity, intermittent rest breaks, distractions provided for pain management, pt reports tolerable to proceed.   ADL: OT providing skilled intervention on ADL retraining in order to increase independence with tasks and increase activity tolerance. Pt completed the following tasks at the current level of assist: Grooming/oral hygiene: set-up, able to manipulate toothbrush/toothpaste with BUE and complete task with independence Toileting: perched in stedy to urinate, pt urinated an unrecordable amount d/t little amount, pt required assistance for management over waist, total A overall UB dressing: Max A standing in stedy, able to thread arms into shirt halfway, assistance required for thoroughness and overhead LB dressing: total A, assistance threading over legs seated in W/C, standing in stedy to manage over waist Footwear: total A for donning shoes seated in  W/C Transfers:Mod-Max A +1 for standing in stedy from W/C for 3 trials for increased global strength, endurance and standing tolerance for ADL abilities  Pt seated in W/C at end of session with W/C alarm donned, call light within reach and 4Ps assessed.     Therapy/Group: Individual Therapy  Nila Barth, OTD, OTR/L 10/21/2023, 11:44 AM

## 2023-10-21 NOTE — Progress Notes (Signed)
 PROGRESS NOTE   Subjective/Complaints:  Pt able ot have BM on bedpan last night- so more continent.   Also voiding some, but still needing cathing- no side effects/OH from Flomax  per pt.  Slept like a rock per pt.    ROS:   Pt denies SOB, abd pain, CP, N/V/C/D, and vision changes  Objective:   VAS US  LOWER EXTREMITY VENOUS (DVT) Result Date: 10/19/2023  Lower Venous DVT Study Patient Name:  Christian Bailey  Date of Exam:   10/19/2023 Medical Rec #: [id number]     Accession #:    [id number] Date of Birth: 09-02-1940     Patient Gender: M Patient Age:   83 years Exam Location:  [facility] Procedure:      VAS US  LOWER EXTREMITY VENOUS (DVT) Referring Phys: PAMELA LOVE --------------------------------------------------------------------------------  Indications: Immobility.  Risk Factors: Immobility Surgery Spinal surgery 10/11/23. Limitations: Scar tissue and arterial clacifications. Comparison Study: None. Performing Technologist: Estanislao Heimlich  Examination Guidelines: A complete evaluation includes B-mode imaging, spectral Doppler, color Doppler, and power Doppler as needed of all accessible portions of each vessel. Bilateral testing is considered an integral part of a complete examination. Limited examinations for reoccurring indications may be performed as noted. The reflux portion of the exam is performed with the patient in reverse Trendelenburg.  +---------+---------------+---------+-----------+----------+--------------+ RIGHT    CompressibilityPhasicitySpontaneityPropertiesThrombus Aging +---------+---------------+---------+-----------+----------+--------------+ CFV                                                   Obstructed     +---------+---------------+---------+-----------+----------+--------------+ SFJ                                                   Obstructed      +---------+---------------+---------+-----------+----------+--------------+ FV Prox  Full                                                        +---------+---------------+---------+-----------+----------+--------------+ FV Mid   Full                                                        +---------+---------------+---------+-----------+----------+--------------+ FV DistalFull                                                        +---------+---------------+---------+-----------+----------+--------------+ PFV      Full                                                        +---------+---------------+---------+-----------+----------+--------------+  POP      Full           Yes      Yes                                 +---------+---------------+---------+-----------+----------+--------------+ PTV      Full                    Yes                                 +---------+---------------+---------+-----------+----------+--------------+ PERO     Full                    Yes                                 +---------+---------------+---------+-----------+----------+--------------+   +---------+---------------+---------+-----------+----------+-------------------+ LEFT     CompressibilityPhasicitySpontaneityPropertiesThrombus Aging      +---------+---------------+---------+-----------+----------+-------------------+ CFV      Full           Yes      Yes                                      +---------+---------------+---------+-----------+----------+-------------------+ SFJ      Full                                                             +---------+---------------+---------+-----------+----------+-------------------+ FV Prox  Full                                                             +---------+---------------+---------+-----------+----------+-------------------+ FV Mid   Full                                                              +---------+---------------+---------+-----------+----------+-------------------+ FV DistalFull                    Yes                  Not well visualized +---------+---------------+---------+-----------+----------+-------------------+ PFV      Full                                                             +---------+---------------+---------+-----------+----------+-------------------+ POP      Full           Yes      Yes                                      +---------+---------------+---------+-----------+----------+-------------------+  PTV      Full                    Yes                                      +---------+---------------+---------+-----------+----------+-------------------+ PERO     Full                    Yes                                      +---------+---------------+---------+-----------+----------+-------------------+     Summary: BILATERAL: - No evidence of deep vein thrombosis seen in the lower extremities, bilaterally. -No evidence of popliteal cyst, bilaterally.   *See table(s) above for measurements and observations. Electronically signed by Angela Kell MD on 10/19/2023 at 5:11:46 PM.    Final    Recent Labs    10/19/23 0529  WBC 10.5  HGB 9.3*  HCT 28.1*  PLT 504*   Recent Labs    10/19/23 0529 10/21/23 0605  NA 134* 136  K 4.6 4.4  CL 100 99  CO2 25 27  GLUCOSE 206* 172*  BUN 43* 55*  CREATININE 1.50* 1.84*  CALCIUM  8.7* 8.8*    Intake/Output Summary (Last 24 hours) at 10/21/2023 0829 Last data filed at 10/21/2023 0259 Gross per 24 hour  Intake 477 ml  Output 1650 ml  Net -1173 ml        Physical Exam: Vital Signs Blood pressure (!) 122/57, pulse 76, temperature 98 F (36.7 C), temperature source Oral, resp. rate 17, height 5\' 4"  (1.626 m), weight 60.6 kg, SpO2 98%.     General: awake, alert, appropriate,  after woke him up- still sleepy; supine in bed; NAD HENT: conjugate gaze; oropharynx moist CV:  regular rate and rhythm; no JVD Pulmonary: CTA B/L; no W/R/R- good air movement GI: soft, NT, ND, (+)BS- normoactive Psychiatric: appropriate, but sleepy Neurological: Ox3  Spasticity- MAS of 1+ to 2 in Ue's this Am and 3 in RLE- no change today Neurologic: Cranial nerves II through XII intact, motor strength is 4/5 in bilateral  bicep, tricep, grip,and finger ext and hand intrinsics, 3- RIght delt and 4/5 left delt, 3- bilateral  hip flexor, 3- RIght and 4- left knee extensors, 4/5 bilateral ankle dorsiflexor and plantar flexor Sensory exam normal sensation to light touch and proprioception in bilateral upper and lower extremities Tone- 3+ a bilateral biceps triceps and BR, + Hoffman's bilateral 2/5 RIght 3+ left knee Also MAS 3 tone in RIght Knee flexors and extensors, MAS 1 at the left knee Clonus at bilateral ankles  Musculoskeletal: Full range of motion in all 4 extremities. No joint swelling   Assessment/Plan: 1. Functional deficits which require 3+ hours per day of interdisciplinary therapy in a comprehensive inpatient rehab setting. Physiatrist is providing close team supervision and 24 hour management of active medical problems listed below. Physiatrist and rehab team continue to assess barriers to discharge/monitor patient progress toward functional and medical goals  Care Tool:  Bathing              Bathing assist Assist Level: Dependent - Patient 0%     Upper Body Dressing/Undressing Upper body dressing        Upper body assist Assist Level: Moderate Assistance -  Patient 50 - 74%    Lower Body Dressing/Undressing Lower body dressing            Lower body assist Assist for lower body dressing: Dependent - Patient 0%     Toileting Toileting    Toileting assist Assist for toileting: Dependent - Patient 0%     Transfers Chair/bed transfer  Transfers assist     Chair/bed transfer assist level: Maximal Assistance - Patient 25 - 49%      Locomotion Ambulation   Ambulation assist   Ambulation activity did not occur: Safety/medical concerns          Walk 10 feet activity   Assist  Walk 10 feet activity did not occur: Safety/medical concerns        Walk 50 feet activity   Assist Walk 50 feet with 2 turns activity did not occur: Safety/medical concerns         Walk 150 feet activity   Assist Walk 150 feet activity did not occur: Safety/medical concerns         Walk 10 feet on uneven surface  activity   Assist Walk 10 feet on uneven surfaces activity did not occur: Safety/medical concerns         Wheelchair     Assist Is the patient using a wheelchair?: Yes Type of Wheelchair: Manual    Wheelchair assist level: Dependent - Patient 0% Max wheelchair distance: 150    Wheelchair 50 feet with 2 turns activity    Assist        Assist Level: Dependent - Patient 0%   Wheelchair 150 feet activity     Assist      Assist Level: Dependent - Patient 0%   Blood pressure (!) 122/57, pulse 76, temperature 98 F (36.7 C), temperature source Oral, resp. rate 17, height 5\' 4"  (1.626 m), weight 60.6 kg, SpO2 98%.  Medical Problem List and Plan: 1. 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  Antithrombotics: -DVT/anticoagulation:  Mechanical: Sequential compression devices, below knee Bilateral lower extremities 4/23- will see if Dr Ellery Guthrie will allow us  to start Lovenox - he said yes, so will start.              -antiplatelet therapy: N/A 3. Pain Management: tylenol  prn.  4. Mood/Behavior/Sleep: LCSW to follow up for evaluation and support when appropriate             --Delirium precautions--His mental status has cleared but will monitor for worsening with transfer  to new unit .pt has not required prn meds for agitation in several days  Monitor sleep wake cycle             -antipsychotic agents: Change haldol  q 6 hrs prn to Seroquel  prn  4/23-4/24 no delirium so far 5. Neuropsych/cognition: This patient is capable of making decisions on his own behalf. 6. Skin/Wound Care: Routine pressure relief measures. 7. Fluids/Electrolytes/Nutrition:  Monitor I/O. Check CMET in am 8. 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  Urinary retention - Neurogenic bladder  d/t cervical myelopathy plus possible diabetic cystopathy , pt  c/o poor stream and dribbling for ~43mo PTA, did not see a urologist,  Was on Myrbetriq , which may exacerbate overflow incont, consider d/c prior to voiding trial , place on flomax    4/22- Foley removed this AM- too soon to see if he will void or need in/out cath yet- will cath if volumes >350cc  4/23- Needed cathing at least 2 overnight- will start Flomax  0.4 mg q supper to try and help pt void 4/24- no side effects with Flomax - still voiding some, but still needing cathing- .  10.  Diabetes- A1c 8.0-  with peripheral neuropathy by hx only on Jardiance at home, use SSI in the hospital  11.  Spasticity affecting LEs> UE and R>L , aggressive ROM  with PT, consider oral meds   4/22- starting baclofen  5 mg TID- with his Cr 1.50- has been running 1.68 to 2.30  4/24- will not increase baclofen  due to Renal issues 12.  PAD s/p SFA and Iliac stenting  13. CKD3b? With AKI  4/22- Pt's Cr down to 1.50 from 2.29 2 days ago and 1.68 yesterday- will monitor 2x/week to see if will reduce further or be labile.   4/24- Cr 1.84- and BUN stable at 55-   -is drinking per pt 14. Questionable neurogenic bowel  4/24- required suppository 2 days ago however had BM on bedpan yesterday- thinks is mild.    I spent a total of  36  minutes on total care today-  >50% coordination of care- due to  D/w nursing about overnight and cathing- is voiding, but still needs caths- also discussed bowels- flomax ; and did IPOC  LOS: 3 days A FACE TO FACE EVALUATION WAS PERFORMED  Rylin Saez 10/21/2023, 8:29 AM

## 2023-10-21 NOTE — Progress Notes (Signed)
 Physical Therapy Session Note  Patient Details  Name: Christian Bailey MRN: [id number] Date of Birth: 1941/04/24  Today's Date: 10/21/2023 PT Individual Time: 0800-0845 PT Individual Time Calculation (min): 45 min   Short Term Goals: Week 1:  PT Short Term Goal 1 (Week 1): Pt will initiate gait training PT Short Term Goal 2 (Week 1): Pt will perform STS with mod a consistently PT Short Term Goal 3 (Week 1): Pt will perform bed mobility with mod a  Skilled Therapeutic Interventions/Progress Updates:    pt received in bed and agreeable to therapy. Pt reports unrated neck and shoulder pain with mobility. Premedicated and positioned for comfort in chair, but would benefit from manual therapy. Pt also noted buttock pain, unrated. Found skin to be red but blanchable. Nsg already aware, pt positioned on roho for skin protection.   Bed mobility with mod a and max cueing to use strategies and momentum to complete mobility. Pt with c/o mild wooziness immediately on sitting which improved after sitting several minutes. Repeated on standing but resolved once seated in w/c.   Pt assisted with donning pants tot a. Heavy mod a to stand in stedy, tot a to pull pants over hips. Noted stiffness and spasticity with mobility, mild improvement after standing in stedy for transfer.   Therapist then adjusted TIS backrest and head rest for positioning and to minimize pain. Pt remained in chair at end of session, was left with all needs in reach and alarm active.    Therapy Documentation Precautions:  Precautions Precautions: Fall, Cervical Precaution/Restrictions Comments: decreased recall of precautions, not able to verbalize any when prompted Required Braces or Orthoses: Cervical Brace Cervical Brace: Soft collar Restrictions Weight Bearing Restrictions Per Provider Order: No General:       Therapy/Group: Individual Therapy  [name] 10/21/2023, 8:53 AM

## 2023-10-21 NOTE — Progress Notes (Signed)
 Speech Language Pathology Daily Session Note  Patient Details  Name: Christian Bailey MRN: [id number] Date of Birth: 19-Jul-1940  Today's Date: 10/21/2023 SLP Individual Time: 4098-1191 SLP Individual Time Calculation (min): 50 min  Short Term Goals: Week 1: SLP Short Term Goal 1 (Week 1): Patient will utilize swallowing compensatory strategies to reduce s/sx of aspiration during consumption of PO given min multimodal A SLP Short Term Goal 2 (Week 1): Pt will trial Dys 3 textures with SLP only demonstrating timely and functional oral phase of swallow provided min A cues  Skilled Therapeutic Interventions: SLP conducted skilled therapy session targeting dysphagia management goals. SLP coordinated lunch tray with kitchen earlier in the morning, however lunch tray never arrived despite calling kitchen during session to check status. SLP thus assessed tolerance of upgraded trials with graham crackers. Patient endorsed that crackers were dry but benefited from supervision use of liquid wash. No overt difficulty observed when utilizing this strategy. Recommend upgrade to Dys3/thin liquid diet. SLP will continue to follow to assess tolerance of upgraded diet with meal and to trial further solid upgrades. Patient was left in room with call bell in reach and alarm set. SLP will continue to target goals per plan of care.        Pain Pain Assessment Pain Scale: 0-10 Pain Score: 0-No pain  Therapy/Group: Individual Therapy  Dorla Gartner, M.A., CCC-SLP  Lahela Woodin A Marzella Miracle 10/21/2023, 12:00 PM

## 2023-10-21 NOTE — Progress Notes (Signed)
 Physical Therapy Session Note  Patient Details  Name: Christian Bailey MRN: [id number] Date of Birth: 11/20/1940  Today's Date: 10/21/2023 PT Individual Time: 4098-1191 PT Individual Time Calculation (min): 32 min   Short Term Goals: Week 1:  PT Short Term Goal 1 (Week 1): Pt will initiate gait training PT Short Term Goal 2 (Week 1): Pt will perform STS with mod a consistently PT Short Term Goal 3 (Week 1): Pt will perform bed mobility with mod a  Skilled Therapeutic Interventions/Progress Updates:    Pt asleep in TIS w/c but able to arouse. Pt transported to therapy gym with goal for focus on transfers. Pt difficulty with transition anteriorly, requiring heavy mod assist to facilitate and to maintain to scoot to edge of chair with max assist. Pt difficulty with sustaining position. Pt reports increased spasms and spasticity in LLE today and demonstrates overall difficulty with transitional movements through trunk as well. Attempted to perform transfer to the L but unable even with max assist and pt reporting he just doesn't feel like he can help at all right now. Engaged in seated stretching to LLE to decrease spasms and spasticity after repositioning with max assist in w/c and pt reporting improved pain in neck/shoulders. Pt reporting urge for BM. Returned to room and used Stedy to transfer to toilet [facility] over toilet) with max assist for sit > stand and mod from perched position on stedy. Total assist for clothing mangement. Pt was unable to perform BM and just passed gas. Despite multiple attempts, pt unable to perform sit > stand with Stedy from toilet and required +2 assist to complete. Cues for hand placement and facilitation for anterior weightshift and then trunk extension in standing. Handoff to SLP.  Therapy Documentation Precautions:  Precautions Precautions: Fall, Cervical Precaution/Restrictions Comments: decreased recall of precautions, not able to verbalize any when prompted Required  Braces or Orthoses: Cervical Brace Cervical Brace: Soft collar Restrictions Weight Bearing Restrictions Per Provider Order: No  Pain:  Reporting pain in neck and across shoulders - nursing notified for pain medication during session. Repositioning also helped throughout session.    Therapy/Group: Individual Therapy  Gita Lamb [name], PT, DPT, CBIS  10/21/2023, 11:56 AM

## 2023-10-22 DIAGNOSIS — N319 Neuromuscular dysfunction of bladder, unspecified: Secondary | ICD-10-CM

## 2023-10-22 DIAGNOSIS — N368 Other specified disorders of urethra: Secondary | ICD-10-CM | POA: Diagnosis not present

## 2023-10-22 DIAGNOSIS — R339 Retention of urine, unspecified: Secondary | ICD-10-CM | POA: Diagnosis not present

## 2023-10-22 DIAGNOSIS — G959 Disease of spinal cord, unspecified: Secondary | ICD-10-CM | POA: Diagnosis not present

## 2023-10-22 LAB — GLUCOSE, CAPILLARY
Glucose-Capillary: 119 mg/dL — ABNORMAL HIGH (ref 70–99)
Glucose-Capillary: 279 mg/dL — ABNORMAL HIGH (ref 70–99)
Glucose-Capillary: 287 mg/dL — ABNORMAL HIGH (ref 70–99)
Glucose-Capillary: 252 mg/dL — ABNORMAL HIGH (ref 70–99)

## 2023-10-22 MED ORDER — TEMAZEPAM 7.5 MG PO CAPS
7.5000 mg | ORAL_CAPSULE | Freq: Every day | ORAL | Status: DC
  Administered 2023-10-22 – 2023-10-24 (×3): 7.5 mg via ORAL
  Filled 2023-10-22 (×3): qty 1

## 2023-10-22 NOTE — Plan of Care (Signed)
  Problem: RH Swallowing Goal: LTG Patient will consume least restrictive diet using compensatory strategies with assistance (SLP) Description: LTG:  Patient will consume least restrictive diet using compensatory strategies with assistance (SLP) Outcome: Completed/Met   

## 2023-10-22 NOTE — Progress Notes (Signed)
 Recreational Therapy Session Note  Patient Details  Name: Christian Bailey MRN: [id number] Date of Birth: 05/24/1941 Today's Date: 10/22/2023  Pain: no c/o Skilled Therapeutic Interventions/Progress Updates:  Goal:  Pt will maintain dynamic sitting balance perched on STEDY with min assist.  MET  Pt participated in dynamic sitting balance task perched on STEDY with close supervision-min assist for modified volleyball task.  Pt required verbal cues for balance throughout the session.  Pt tends to lean posteriorly especially with fatigue.    Therapy/Group: Co-Treatment   Jonia Oakey 10/22/2023, 12:41 PM

## 2023-10-22 NOTE — Progress Notes (Addendum)
 Physical Therapy Session Note  Patient Details  Name: Christian Bailey MRN: [id number] Date of Birth: Nov 04, 1940  Today's Date: 10/22/2023 PT Individual Time: 1030-1100 PT Individual Time Calculation (min): 30 min   Short Term Goals: Week 1:  PT Short Term Goal 1 (Week 1): Pt will initiate gait training PT Short Term Goal 2 (Week 1): Pt will perform STS with mod a consistently PT Short Term Goal 3 (Week 1): Pt will perform bed mobility with mod a  Skilled Therapeutic Interventions/Progress Updates:    Session focused on LE weight bearing using stedy for spasticity management, core strength, and functional use of BUE. Pt with intermittent neck and shoulder pain, premedicated. Rest and positioning provided as needed. Pt participated in x 2 bouts of standing using stedy perch and tapping beach ball. First bout limited by dizziness which resolved quickly in sitting. No symptoms on 2nd stand. Performed with LUE both overhand and underhand to manage fatigue and shoulder discomfort and with RUE in limited ROM. Progressed to LUE with no UE support to further challenge balance and core support. Pt returned to room after session and was left with all needs in reach and alarm active.   Therapy Documentation Precautions:  Precautions Precautions: Fall, Cervical Precaution/Restrictions Comments: decreased recall of precautions, not able to verbalize any when prompted Required Braces or Orthoses: Cervical Brace Cervical Brace: Soft collar Restrictions Weight Bearing Restrictions Per Provider Order: No General:       Therapy/Group: Individual Therapy  Jannelle Notaro C Areli Jowett 10/22/2023, 12:22 PM

## 2023-10-22 NOTE — Consult Note (Signed)
 Urology Consult   Physician requesting consult: Celia Coles, MD  Reason for consult: Urinary retention, unable to catheterize  History of Present Illness: Christian Bailey is a 83 y.o. male with incomplete quadriplegia status post C5-7 spinal cord injury who is seen in consultation from Dr. Lovorn for urinary retention and difficulty with catheterization.  He has had urinary retention which has been managed with a Foley catheter which was removed on 4/22 and subsequently intermittent catheterization.  He was requiring In-N-Out catheterization every 6 hours with volumes in the 400-650 mL range.  He was voiding some small amounts spontaneously.  He has been on Flomax .  He apparently was catheterized this morning without difficulty.  However, nursing staff was unable to In-N-Out cath him this evening.  After attempts at catheterization blood was noted from the urethral meatus.  Urologic consultation was requested.  He has a history of lower urinary tract symptoms prior to his admission.  He has not been seen by a urologist.  Past Medical History:  Diagnosis Date   Anemia    Complication of anesthesia    Diabetes mellitus (HCC)    TYPE 2   GERD (gastroesophageal reflux disease)    History of blood transfusion    GI bleed   History of colon polyps    History of hiatal hernia    Hyperlipemia    Hypertension    Osteoarthritis    PAD (peripheral artery disease) (HCC)    a. stenting of his left common iliac artery >20 years ago. b. h/o LEIA stent and 2 stents to R SFA in 2011. c. 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 Noted during admission 04/2014  but upon review seems to be frequent finding for patient.   Stenosis of carotid artery    a. 50% right carotid stenosis by angiogram 04/2014.    Past Surgical History:  Procedure Laterality Date   ABDOMINAL AORTOGRAM W/LOWER EXTREMITY N/A 01/27/2017   Procedure: Abdominal Aortogram w/Lower Extremity;  Surgeon: Margherita Shell, MD;  Location: [facility];  Service: Cardiovascular;  Laterality: N/A;   ABDOMINAL AORTOGRAM W/LOWER EXTREMITY N/A 06/08/2017   Procedure: ABDOMINAL AORTOGRAM W/LOWER EXTREMITY;  Surgeon: Margherita Shell, MD;  Location: [facility];  Service: Cardiovascular;  Laterality: N/A;   ABDOMINAL AORTOGRAM W/LOWER EXTREMITY N/A 08/31/2017   Procedure: ABDOMINAL AORTOGRAM W/LOWER EXTREMITY;  Surgeon: Margherita Shell, MD;  Location: [facility];  Service: Cardiovascular;  Laterality: N/A;  rt. unilateral   ANGIOPLASTY / STENTING FEMORAL     ANGIOPLASTY / STENTING ILIAC     ANTERIOR CERVICAL CORPECTOMY N/A 10/12/2023   Procedure: ANTERIOR CERVICAL DECOMPRESSION VIA CORPECTOMY WITH RECONSTRUCTION INCLUDING ANTERIOR CERVICAL PLATING CERVICAL FIVE-SIX,CERVICAL SIX-SEVEN;  Surgeon: Elna Haggis, MD;  Location: [facility] OR;  Service: Neurosurgery;  Laterality: N/A;   AORTA - BILATERAL FEMORAL ARTERY BYPASS GRAFT N/A 07/06/2018   Procedure: AORTA BIFEMORAL BYPASS GRAFT USING 14X7MM X 40CM HEMASHIELD GOLD GRAFT;  Surgeon: Margherita Shell, MD;  Location: [facility];  Service: Vascular;  Laterality: N/A;   CEREBRAL ANGIOGRAM N/A 05/14/2014   Procedure: CEREBRAL ANGIOGRAM;  Surgeon: Frederico Jan  Katheryne Pane, MD;  Location: [facility] CATH LAB;  Service: Cardiovascular;  Laterality: N/A;   COLONOSCOPY W/ POLYPECTOMY     ENDARTERECTOMY Right 09/08/2017   ENDARTERECTOMY FEMORAL Right 09/08/2017   Procedure: REDO RIGHT FEMORAL ENDARTECTOMY WITH PATCH ANGIOPLASTY.;  Surgeon: Margherita Shell, MD;  Location: [facility] OR;  Service: Vascular;  Laterality: Right;   EYE SURGERY Bilateral    cataract   FEMORAL  ARTERY STENT Right 05/12/2010   Stented distally with a 6x100 Abbott absolute stent and proximally with a 6x60 Cook Zilver stent resulting in the reduction of the proximal segment 80% and mid segment 60-70% to 0% residual, LEFT common femoral artery stented with a 7x3 Smart stent resulting in reduction of 90% stenosis to 0% residual   FEMORAL-POPLITEAL BYPASS GRAFT Right 09/18/2016   Procedure: BYPASS GRAFT FEMORAL-POPLITEAL ARTERY;  Surgeon: Margherita Shell, MD;  Location: [facility];  Service: Vascular;  Laterality: Right;   FEMORAL-POPLITEAL BYPASS GRAFT Right 09/08/2017   Procedure: REDO BYPASS GRAFT FEMORAL-POPLITEAL ARTERY;  Surgeon: Margherita Shell, MD;  Location: [facility];  Service: Vascular;  Laterality: Right;   FEMORAL-POPLITEAL BYPASS GRAFT Right 07/06/2018   Procedure: REVISION RIGHT FEMORAL TO POPLITEAL ARTERY BYPASS GRAFT;  Surgeon: Margherita Shell, MD;  Location: [facility];  Service: Vascular;  Laterality: Right;   IR CHOLANGIOGRAM EXISTING TUBE  08/04/2018   IR EXCHANGE BILIARY DRAIN  01/22/2019   IR PERC CHOLECYSTOSTOMY  07/19/2018   IR RADIOLOGIST EVAL & MGMT  08/31/2018   IR RADIOLOGIST EVAL & MGMT  01/17/2019   IR RADIOLOGIST EVAL & MGMT  02/01/2019   IR RADIOLOGIST EVAL & MGMT  02/08/2019   KNEE ARTHROSCOPY     left   LOWER EXTREMITY ANGIOGRAM N/A 05/14/2014   Procedure: LOWER EXTREMITY ANGIOGRAM;  Surgeon: Avanell Leigh, MD;  Location: [facility] CATH LAB;  Service: Cardiovascular;  Laterality: N/A;   LOWER EXTREMITY ANGIOGRAPHY N/A 09/21/2016   Procedure: Lower Extremity Angiography;  Surgeon: Arvil Lauber, MD;  Location: [facility] INVASIVE CV LAB;  Service: Cardiovascular;  Laterality: N/A;   PERIPHERAL VASCULAR ATHERECTOMY Right 01/27/2017   Procedure: PERIPHERAL VASCULAR ATHERECTOMY;  Surgeon: Margherita Shell, MD;  Location: [facility];  Service: Cardiovascular;  Laterality: Right;   PERIPHERAL VASCULAR BALLOON ANGIOPLASTY Right 06/08/2017   Procedure: PERIPHERAL VASCULAR BALLOON ANGIOPLASTY;   Surgeon: Margherita Shell, MD;  Location: [facility];  Service: Cardiovascular;  Laterality: Right;  common femoral and superficial femoral arteries   PERIPHERAL VASCULAR CATHETERIZATION N/A 04/20/2016   Procedure: Lower Extremity Intervention;  Surgeon: Avanell Leigh, MD;  Location: [facility] INVASIVE CV LAB;  Service: Cardiovascular;  Laterality: N/A;   PERIPHERAL VASCULAR INTERVENTION  08/31/2017   Procedure: PERIPHERAL VASCULAR INTERVENTION;  Surgeon: Margherita Shell, MD;  Location: [facility];  Service: Cardiovascular;;  REIA   REVERSE SHOULDER ARTHROPLASTY Right 12/07/2016   REVERSE SHOULDER ARTHROPLASTY Right 12/07/2016   Procedure: REVERSE SHOULDER ARTHROPLASTY;  Surgeon: Winston Hawking, MD;  Location: [facility] OR;  Service: Orthopedics;  Laterality: Right;   ROTATOR CUFF REPAIR Right 2003   SFA Right 05/14/2014   PTA  OF RT SFA         DR BERRY    Medications: Scheduled Meds:  (feeding supplement) PROSource Plus  30 mL Oral BID BM   baclofen   5 mg Oral TID   diclofenac  Sodium  2 g Topical QID   DULoxetine   20 mg Oral Daily   enoxaparin  (LOVENOX ) injection  30 mg Subcutaneous Q24H  ferrous sulfate   325 mg Oral Q breakfast   insulin  aspart  0-5 Units Subcutaneous QHS   insulin  aspart  0-9 Units Subcutaneous TID WC   metoprolol  succinate  50 mg Oral Daily   nutrition supplement (JUVEN)  1 packet Oral BID BM   pantoprazole   40 mg Oral BID   polyethylene glycol  32 g Oral Daily   predniSONE   10 mg Oral Q breakfast   pregabalin   50 mg Oral BID   Ensure Max Protein  11 oz Oral BID   rosuvastatin   20 mg Oral QPM   senna-docusate  2 tablet Oral Q breakfast   tamsulosin   0.4 mg Oral QPC supper   temazepam   7.5 mg Oral QHS   Continuous Infusions: PRN Meds:.acetaminophen , alum & mag hydroxide-simeth, bisacodyl , cyclobenzaprine , diphenhydrAMINE , guaiFENesin -dextromethorphan , lidocaine , melatonin, menthol -cetylpyridinium **OR** phenol, prochlorperazine  **OR** prochlorperazine   **OR** prochlorperazine , QUEtiapine , sodium phosphate   Allergies:  Allergies  Allergen Reactions   Lajean Pike ] Other (See Comments)    GI bleeding   Gadolinium Derivatives Itching, Swelling and Other (See Comments)    Pt complained of face flushing/hottness and throat tightness/scratchiness immediately after the injections.  Within 4 minutes, all symptoms were gone and the study was completed.  No further complications or signs of allergy were exhibited after completion of study.    Iodinated Contrast Media Other (See Comments)    Pt does not recall reaction   Pneumococcal 13-Val Conj Vacc     Other Reaction(s): achiness all over, dizziness, nausea, weakness   Scopolamine      Other Reaction(s): Delerium   Oxycodone -Acetaminophen  Other (See Comments)    Dizziness and feeling of being uncomfortable     Family History  Problem Relation Age of Onset   Heart disease Mother    Leukemia Mother    Stomach cancer Father    Esophageal cancer Brother    Liver disease Brother    Alcoholism Brother    Leukemia Brother    Leukemia Brother    Diabetes type II Brother    Lung disease Sister     Social History:  reports that he quit smoking about 35 years ago. He started smoking about 60 years ago. He has never used smokeless tobacco. He reports current alcohol  use. He reports that he does not use drugs.  ROS: A complete review of systems was performed.  All systems are negative except for pertinent findings as noted.  Physical Exam:  Vital signs in last 24 hours: Temp:  [97.8 F (36.6 C)-98 F (36.7 C)] 97.8 F (36.6 C) (04/25 1926) Pulse Rate:  [76-93] 93 (04/25 1926) Resp:  [16-18] 18 (04/25 1926) BP: (113-128)/(48-64) 128/53 (04/25 1926) SpO2:  [94 %-96 %] 95 % (04/25 1926) GENERAL APPEARANCE:  Well appearing, well developed, well nourished, NAD HEENT:  Atraumatic, normocephalic, oropharynx clear NECK:  Supple  ABDOMEN:  Soft, non-tender, no masses EXTREMITIES:  moves  UE NEUROLOGIC:  Alert and oriented x 3, CN II-XII grossly intact MENTAL STATUS:  appropriate BACK:  Non-tender to palpation, No CVAT SKIN:  Warm, dry, and intact GU:  circumcised phallus, normal meatus, normal scrotum  Laboratory Data:  No results for input(s): "WBC", "HGB", "HCT", "PLT" in the last 72 hours.  Recent Labs    10/21/23 0605  NA 136  K 4.4  CL 99  GLUCOSE 172*  BUN 55*  CALCIUM  8.8*  CREATININE 1.84*     Results for orders placed or performed during the hospital encounter of 10/18/23 (from the past 24 hours)  Glucose, capillary     Status: Abnormal   Collection Time: 10/22/23  6:48 AM  Result Value Ref Range   Glucose-Capillary 119 (H) 70 - 99 mg/dL  Glucose, capillary     Status: Abnormal   Collection Time: 10/22/23 12:30 PM  Result Value Ref Range   Glucose-Capillary 279 (H) 70 - 99 mg/dL  Glucose, capillary     Status: Abnormal   Collection Time: 10/22/23  4:26 PM  Result Value Ref Range   Glucose-Capillary 287 (H) 70 - 99 mg/dL   No results found for this or any previous visit (from the past 240 hours).  Renal Function: Recent Labs    10/16/23 0615 10/19/23 0529 10/21/23 0605  CREATININE 1.68* 1.50* 1.84*   Estimated Creatinine Clearance: 25.9 mL/min (A) (by C-G formula based on SCr of 1.84 mg/dL (H)).  Radiologic Imaging: No results found.  I independently reviewed the above imaging studies.  Procedure: Foley insertion  Foley catheter insertion is accomplished under sterile conditions using a 18 Fr. Coude catheter. Ten ml of sterile water is left in the retention balloon. There is the immediate return of 400 ml of urine. Tolerated well without complications. Foley catheter is left to gravity drainage.  Procedure: insert indwelling catheter bedside  Impression/Recommendation Urinary retention Neurogenic bladder Iatrogenic urethral trauma with attempted catheterization  Recommend continuing the Foley catheter for 7-10 days to allow  any urethral injury to heal. Consider resuming In-N-Out catheterization for management of his neurogenic bladder at that time.  Recommend using a coud catheter for intermittent catheterization. Please call with any additional problems.   Oda Bence 10/22/2023, 8:42 PM

## 2023-10-22 NOTE — Progress Notes (Signed)
 Speech Language Pathology Daily Session Note  Patient Details  Name: Christian Bailey MRN: [id number] Date of Birth: 1941-02-24  Today's Date: 10/22/2023 SLP Individual Time: 0900-1000 SLP Individual Time Calculation (min): 60 min  Short Term Goals: Week 1: SLP Short Term Goal 1 (Week 1): Patient will utilize swallowing compensatory strategies to reduce s/sx of aspiration during consumption of PO given min multimodal A SLP Short Term Goal 2 (Week 1): Pt will trial Dys 3 textures with SLP only demonstrating timely and functional oral phase of swallow provided min A cues  Skilled Therapeutic Interventions:   Pt greeted in his room. He was awake/alert in his wheelchair upon SLP arrival and was pleasant throughout tx tasks targeting dysphagia. SLP provided PO trials of regular textures (crunchy granola bar) and thin liquids via straw (coke). He demonstrated adequate mastication and oral clearance throughout trials. Only cough x1 noted overall, though this occurred while the pt was talking. Anticipate mistiming of airway protection given phonation. No overt s/s of airway invasion noted otherwise. He reported "really enjoying" his Dys 3 trays thus far and voiced no concerns re oropharyngeal clearance. Anticipate initial pharyngeal edema and prevertebral soft tissue swelling have resolved to trace amounts or less at this time. He would benefit from upgrade to regular textures/thin liquids given continued diet tolerance and adequate pharyngeal clearance. He does still benefit from set up of meal trays and intermittent supervision to ensure adequate success w/ self feeding. He verbalized understanding of aspiration precautions; including positioning, alternating liquids/solids, slow rate, and limited distractions. If self feeding is negatively impacted by upgrade to regular textures, he may benefit from Dys 3 textures. However, he demonstrates an overall safe and efficient swallow to tolerate regular textures/thin  liquids and dysphagia intervention is no longer warranted. See d/c summary for more info.   Pain  1/10 L shoulder pain. Scheduled pain medication. See EMR for more info.   Therapy/Group: Individual Therapy  Rozell Cornet 10/22/2023, 7:59 AM

## 2023-10-22 NOTE — Progress Notes (Signed)
 PROGRESS NOTE   Subjective/Complaints:  Pt had poor sleep overnight- didn't really fall asleep til 4am except a few dozes.  Cathing still in spite of small voids- 650cc at 5:30am.  Had an incontinent smear this AM- LBM 2 days ago  ROS:   Pt denies SOB, abd pain, CP, N/V/C/D, and vision changes   Objective:   No results found.  No results for input(s): "WBC", "HGB", "HCT", "PLT" in the last 72 hours.  Recent Labs    10/21/23 0605  NA 136  K 4.4  CL 99  CO2 27  GLUCOSE 172*  BUN 55*  CREATININE 1.84*  CALCIUM  8.8*    Intake/Output Summary (Last 24 hours) at 10/22/2023 0751 Last data filed at 10/22/2023 0541 Gross per 24 hour  Intake 347 ml  Output 2350 ml  Net -2003 ml        Physical Exam: Vital Signs Blood pressure (!) 116/48, pulse 76, temperature 98 F (36.7 C), temperature source Oral, resp. rate 18, height 5\' 4"  (1.626 m), weight 60.6 kg, SpO2 96%.      General:asleep- harder to wake up this AM; sleepy; NAD HENT: conjugate gaze; oropharynx moist CV: regular rate and rhythm; no JVD Pulmonary: CTA B/L; no W/R/R- good air movement GI: soft, NT, ND, (+)BS- slightly hypoactive, but not awake well Psychiatric: very sleepy- no sleep Neurological: Ox3 but drowsy   Spasticity- MAS of 1+ to 2 in Ue's this Am and 3 in RLE- no change today Neurologic: Cranial nerves II through XII intact, motor strength is 4/5 in bilateral  bicep, tricep, grip,and finger ext and hand intrinsics, 3- RIght delt and 4/5 left delt, 3- bilateral  hip flexor, 3- RIght and 4- left knee extensors, 4/5 bilateral ankle dorsiflexor and plantar flexor Sensory exam normal sensation to light touch and proprioception in bilateral upper and lower extremities Tone- 3+ a bilateral biceps triceps and BR, + Hoffman's bilateral 2/5 RIght 3+ left knee Also MAS 3 tone in RIght Knee flexors and extensors, MAS 1 at the left knee Clonus at  bilateral ankles  Musculoskeletal: Full range of motion in all 4 extremities. No joint swelling   Assessment/Plan: 1. Functional deficits which require 3+ hours per day of interdisciplinary therapy in a comprehensive inpatient rehab setting. Physiatrist is providing close team supervision and 24 hour management of active medical problems listed below. Physiatrist and rehab team continue to assess barriers to discharge/monitor patient progress toward functional and medical goals  Care Tool:  Bathing              Bathing assist Assist Level: Dependent - Patient 0%     Upper Body Dressing/Undressing Upper body dressing        Upper body assist Assist Level: Moderate Assistance - Patient 50 - 74%    Lower Body Dressing/Undressing Lower body dressing            Lower body assist Assist for lower body dressing: Dependent - Patient 0%     Toileting Toileting    Toileting assist Assist for toileting: Dependent - Patient 0%     Transfers Chair/bed transfer  Transfers assist     Chair/bed transfer assist level:  Dependent - mechanical lift     Locomotion Ambulation   Ambulation assist   Ambulation activity did not occur: Safety/medical concerns          Walk 10 feet activity   Assist  Walk 10 feet activity did not occur: Safety/medical concerns        Walk 50 feet activity   Assist Walk 50 feet with 2 turns activity did not occur: Safety/medical concerns         Walk 150 feet activity   Assist Walk 150 feet activity did not occur: Safety/medical concerns         Walk 10 feet on uneven surface  activity   Assist Walk 10 feet on uneven surfaces activity did not occur: Safety/medical concerns         Wheelchair     Assist Is the patient using a wheelchair?: Yes Type of Wheelchair: Manual    Wheelchair assist level: Dependent - Patient 0% Max wheelchair distance: 150    Wheelchair 50 feet with 2 turns  activity    Assist        Assist Level: Dependent - Patient 0%   Wheelchair 150 feet activity     Assist      Assist Level: Dependent - Patient 0%   Blood pressure (!) 116/48, pulse 76, temperature 98 F (36.7 C), temperature source Oral, resp. rate 18, height 5\' 4"  (1.626 m), weight 60.6 kg, SpO2 96%.  Medical Problem List and Plan: 1. 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  Antithrombotics: -DVT/anticoagulation:  Mechanical: Sequential compression devices, below knee Bilateral lower extremities 4/23- will see if Dr Ellery Guthrie will allow us  to start Lovenox - he said yes, so will start.              -antiplatelet therapy: N/A 3. Pain Management: tylenol  prn.  4. Mood/Behavior/Sleep: LCSW to follow up for evaluation and support when appropriate             --Delirium precautions--His mental status has cleared but will monitor for worsening with transfer to new unit .pt has not required prn meds for agitation in several days  Monitor sleep wake cycle             -antipsychotic agents: Change haldol  q 6 hrs prn to Seroquel  prn  4/23-4/25 no delirium so far 5. Neuropsych/cognition: This patient is capable of making decisions on his own behalf. 6. Skin/Wound Care: Routine pressure relief measures. 7. Fluids/Electrolytes/Nutrition:  Monitor I/O. Check CMET in am 8. 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  10.  Diabetes- A1c 8.0-  with peripheral neuropathy by hx only on Jardiance at home, use SSI in the hospital  11.  Spasticity affecting LEs> UE and R>L , aggressive ROM  with PT, consider oral meds   4/22- starting baclofen  5 mg TID- with his Cr 1.50- has been running 1.68 to 2.30  4/24- will not increase baclofen  due to Renal issues 12.  PAD s/p SFA and Iliac stenting  13. CKD3b? With AKI  4/22- Pt's Cr down to 1.50 from 2.29 2 days ago and 1.68 yesterday- will monitor 2x/week to see if will reduce further or be labile.   4/24- Cr 1.84- and BUN stable at 55-   -is drinking per pt 14. Questionable neurogenic bowel  4/24- required suppository 2 days ago however had BM on bedpan yesterday- thinks is mild.   4/25- smear that was incontinent last night   I spent a total of 35   minutes on total care today- >50% coordination of care- due to  D/w pt as well as nursing about bowels, bladder and sleep.  LOS: 4 days A FACE TO FACE EVALUATION WAS PERFORMED  Christian Bailey 10/22/2023, 7:51 AM

## 2023-10-22 NOTE — Progress Notes (Signed)
 Physical Therapy Session Note  Patient Details  Name: Christian Bailey MRN: [id number] Date of Birth: 14-Sep-1940  Today's Date: 10/22/2023 PT Individual Time: 1302-1402 PT Individual Time Calculation (min): 60 min   Short Term Goals: Week 1:  PT Short Term Goal 1 (Week 1): Pt will initiate gait training PT Short Term Goal 2 (Week 1): Pt will perform STS with mod a consistently PT Short Term Goal 3 (Week 1): Pt will perform bed mobility with mod a  Skilled Therapeutic Interventions/Progress Updates:    Pt presents in bed and agreeable to therapy, reporting overall gratefulness with the staff here and his experience. Discussed earlier sessions and pt reports having some dizziness with standing. Donned tedhose for BP management and monitored symptoms with mobility (initial reports when come to EOB but no other complaints during session). Extra time and max assist for supine to sit with multiple attempts due to increased tone in BLE (adduction noted more than in previous sessions with this therapist) and truncal extension. Once EOB pt able to maintain balance with close supervision to CGA with UE support on bedrail while PT donned shoes total assist and worked on ROM to decrease tightness in BLE. Difficulty with working through adductor tone. Utilized Stedy for transfer to w/c with total assist for sit > stand from elevated surface (pt unable from regular bed height) and even heavy mod assist from perched position, limited upright tolerance noted. Pt able to assist with repositioning in the w/c with scooting to get bottom back. Demonstrated slideboard transfer technique (with Beasy board) as no slideboards available to attempt - will want to trial in future session for transfers. Seated from TIS, utilized Kinetron for BLE strengthening, activation of musculature, and reciprocal movement pattern retraining for NMR. Requires PT to assist with movement of pedals due to decreased strength and pt fatigued after a  few repetitions. Took a couple min rest break and repeated again until fatigue. Pt with no sensation or proprioception of what feet were doing during activity. Returned back to room and all needs in reach.   Therapy Documentation Precautions:  Precautions Precautions: Fall, Cervical Precaution/Restrictions Comments: decreased recall of precautions, not able to verbalize any when prompted Required Braces or Orthoses: Cervical Brace Cervical Brace: Soft collar Restrictions Weight Bearing Restrictions Per Provider Order: No    Pain Reports some pain in neck and shoulders - premedicated and repositioning and rest breaks provided as needed.    Therapy/Group: Individual Therapy  Gita Lamb [name], PT, DPT, CBIS  10/22/2023, 2:40 PM

## 2023-10-22 NOTE — Progress Notes (Signed)
 Occupational Therapy Session Note  Patient Details  Name: Christian Bailey MRN: [id number] Date of Birth: [birth year]  Today's Date: 10/22/2023 OT Individual Time: 4098-1191 OT Individual Time Calculation (min): 57 min    Short Term Goals: Week 1:  OT Short Term Goal 1 (Week 1): Pt will complete self feeding with Ae as necessary at Min A OT Short Term Goal 2 (Week 1): Pt will complete sit to stand with LRAD at Mod A in preparation for ADLs OT Short Term Goal 3 (Week 1): Pt will complete LB dressing at Max A with AE as necessary   Skilled Therapeutic Interventions/Progress Updates:    Pt bed level at time of session, no pain at rest but with movement and weight bearing reports nerve pain in B feet, did not rate but still able to participate. Brief checked at beginning of session - dry and clean, did not need to toilet. LB dressing total A bed level with pt able to use BUE to assist holding rails with guidance. Supine > sit  with MAX/total and sitting EOB for several minutes to acclimate, educated on hand positioning. Sit <> stand in stedy with MOD A, stedy > wheelchair. Focused on self feeding for brekfast, drink, and coffee with education on techniques and positioning to assist, performing with Supervision. Set up in TIS, alarm on call bell in reach.   Therapy Documentation Precautions:  Precautions Precautions: Fall, Cervical Precaution/Restrictions Comments: decreased recall of precautions, not able to verbalize any when prompted Required Braces or Orthoses: Cervical Brace Cervical Brace: Soft collar Restrictions Weight Bearing Restrictions Per Provider Order: No     Therapy/Group: Individual Therapy  Doroteo Gasmen 10/22/2023, 7:22 AM

## 2023-10-22 NOTE — Progress Notes (Signed)
 Speech Language Pathology Discharge Summary  Patient Details  Name: Christian Bailey MRN: [id number] Date of Birth: Jan 21, 1941  Date of Discharge from SLP service:October 22, 2023  Patient has met 1 of 1 long term goals.  Patient to discharge at overall Independent level.  Reasons goals not met: n/a   Clinical Impression/Discharge Summary: Pt has made excellent progress this stasy as evidenced by improved diet tolerance and reduced oropharyngeal dysphagia. He now tolerates a regular diet/thin liquids independently. Anticipate initial pharyngeal edema s/p ACDF has now resolved and the pt demonstrates an overall safe and efficient oropharyngeal swallow. Pt/family education complete and dysphagia intervention is no longer warranted.   Recommendation:  None     Equipment: n/a   Reasons for discharge: Treatment goals met   Patient/Family Agrees with Progress Made and Goals Achieved: Yes    Rozell Cornet 10/22/2023, 12:39 PM

## 2023-10-22 NOTE — Progress Notes (Signed)
 Attempted Q6 cath at 1730 with coude cath and met with resistance, blood returned. PA notified . Urology consulted.   Tobey Forte LPN

## 2023-10-23 DIAGNOSIS — K592 Neurogenic bowel, not elsewhere classified: Secondary | ICD-10-CM | POA: Diagnosis not present

## 2023-10-23 DIAGNOSIS — R739 Hyperglycemia, unspecified: Secondary | ICD-10-CM

## 2023-10-23 DIAGNOSIS — N319 Neuromuscular dysfunction of bladder, unspecified: Secondary | ICD-10-CM | POA: Diagnosis not present

## 2023-10-23 DIAGNOSIS — G8250 Quadriplegia, unspecified: Secondary | ICD-10-CM | POA: Diagnosis not present

## 2023-10-23 LAB — GLUCOSE, CAPILLARY
Glucose-Capillary: 180 mg/dL — ABNORMAL HIGH (ref 70–99)
Glucose-Capillary: 199 mg/dL — ABNORMAL HIGH (ref 70–99)
Glucose-Capillary: 287 mg/dL — ABNORMAL HIGH (ref 70–99)
Glucose-Capillary: 269 mg/dL — ABNORMAL HIGH (ref 70–99)

## 2023-10-23 MED ORDER — CHLORHEXIDINE GLUCONATE CLOTH 2 % EX PADS
6.0000 | MEDICATED_PAD | Freq: Every day | CUTANEOUS | Status: DC
  Administered 2023-10-23: 6 via TOPICAL

## 2023-10-23 MED ORDER — DOCUSATE SODIUM 100 MG PO CAPS
200.0000 mg | ORAL_CAPSULE | Freq: Every day | ORAL | Status: DC
  Administered 2023-10-23 – 2023-11-24 (×33): 200 mg via ORAL
  Filled 2023-10-23 (×33): qty 2

## 2023-10-23 MED ORDER — CHLORHEXIDINE GLUCONATE CLOTH 2 % EX PADS
6.0000 | MEDICATED_PAD | Freq: Two times a day (BID) | CUTANEOUS | Status: DC
  Administered 2023-10-23 – 2023-11-24 (×64): 6 via TOPICAL

## 2023-10-23 NOTE — Progress Notes (Signed)
 Physical Therapy Session Note  Patient Details  Name: Christian Bailey MRN: [id number] Date of Birth: 1940/12/26  Today's Date: 10/23/2023 PT Individual Time: 1510-1610 PT Individual Time Calculation (min): 60 min   Short Term Goals: Week 1:  PT Short Term Goal 1 (Week 1): Pt will initiate gait training PT Short Term Goal 2 (Week 1): Pt will perform STS with mod a consistently PT Short Term Goal 3 (Week 1): Pt will perform bed mobility with mod a  Skilled Therapeutic Interventions/Progress Updates: Pt presented in TIS agreeable to therapy. Pt initially required reorientation as pt talking about people in hallway and children at start of session however fully oriented after a few minutes. Completed dependent transfer to day room for time management. PTA performed stretching of BLE heel cord and hamstring for spasticity management. Pt expressed that pain has been more manageable today. Pt then set up with Cybex Kinetron and completed x 3 bouts of 10 cycles at 90cm/sec for reciprocal activity. Pt initially requiring AA to initiate and increase range however was able to demonstrate improved range by third set. Pt then moved over to standing frame and set up after explanation. Pt required minA for leaning anteriorly to reach pad for LUE support. Pt was able to tolerate standing frame for 4 min before increased pain. In standing using pillow on platform for support pt was able to improve erect posture as well as perform TKE with LLE and AA TKE with RLE x 8 each. In sitting pt required maxA for posterior scooting to safely sit in TIS. Pt transported back to room and set up with [facility] for time management. Pt completed Sit to stand with heavy modA! As being transferred to bed pt expressed urgency for BM, pt therefore transferred to toilet (BSC placed over toilet) and required maxA to stand from perch and sit at toilet. Pt with BM at toilet however noted skin tear on R forearm, nsg notified. Pt required maxA for Sit  to stand from toilet and dependent for peri-care (+2 present at this point for safety). Pt transported to bed via Stedy and was able to stand from [place] with heavy modA. On bed pt completed lateral lean to R but required maxA for BLE management. Pt repositioned to comfort and left in bed at end of session with bed alarm on, call bell within reach and needs met.      Therapy Documentation Precautions:  Precautions Precautions: Fall, Cervical Precaution/Restrictions Comments: decreased recall of precautions, not able to verbalize any when prompted Required Braces or Orthoses: Cervical Brace Cervical Brace: Soft collar Restrictions Weight Bearing Restrictions Per Provider Order: No   Therapy/Group: Individual Therapy  [name] 10/23/2023, 4:37 PM

## 2023-10-23 NOTE — Progress Notes (Signed)
 Occupational Therapy Session Note  Patient Details  Name: Christian Bailey MRN: [id number] Date of Birth: 1941/05/13  Today's Date: 10/23/2023 OT Individual Time: 1027-2536 OT Individual Time Calculation (min): 45 min   Short Term Goals: Week 1:  OT Short Term Goal 1 (Week 1): Pt will complete self feeding with Ae as necessary at Min A OT Short Term Goal 2 (Week 1): Pt will complete sit to stand with LRAD at Mod A in preparation for ADLs OT Short Term Goal 3 (Week 1): Pt will complete LB dressing at Max A with AE as necessary  Skilled Therapeutic Interventions/Progress Updates:    Pt greeted semi-reclined in bed asleep, increased time to wake, but then agreeable to OT treatment session. Worked on bed mobility rolling L and R with mod A. Total a to wash buttocks and don pants.  Worked on trying to lift B LE's into pant legs, but only able to lift a small amount. Total A to don TED hose and then total A to sit EOB. Worked on sitting balance at EOB with max A due to posterior lean, progressing to min/CGA with B UE supported. Max A to stand in [place] with bed raised and difficulty getting hips fully extended. Able to get Stedy paddles under hip and he sat perched in [place] for max A UB bathing and dressing. Pt returned to bed with total A and left semi-reclined in bed with bed alarm on, call bell in reach, and needs met.   Therapy Documentation Precautions:  Precautions Precautions: Fall, Cervical Precaution/Restrictions Comments: decreased recall of precautions, not able to verbalize any when prompted Required Braces or Orthoses: Cervical Brace Cervical Brace: Soft collar Restrictions Weight Bearing Restrictions Per Provider Order: No Pain:  L shoulder pain, rest and repositioned  Therapy/Group: Individual Therapy  Lethia Raveling 10/23/2023, 8:22 AM

## 2023-10-23 NOTE — Progress Notes (Signed)
 Occupational Therapy Session Note  Patient Details  Name: [name] MRN: [id number] Date of Birth: [birth year]  Today's Date: 10/23/2023 OT Individual Time: 0100-0200 OT Individual Time Calculation (min): 60 min    Short Term Goals: Week 1:  OT Short Term Goal 1 (Week 1): Pt will complete self feeding with Ae as necessary at Min A OT Short Term Goal 2 (Week 1): Pt will complete sit to stand with LRAD at Mod A in preparation for ADLs OT Short Term Goal 3 (Week 1): Pt will complete LB dressing at Max A with AE as necessary  Skilled Therapeutic Interventions/Progress Updates:   Patient in his room at the time of arrival seated at w/c LOF.  Patient indicated that he rested well during the night with no report of pain at the time of treatment. The pt begain the session by threading his fingers and take BUE into shd flexion below 90 degrees  5 x with 2 rest breaks.  The pt was able to complete  leg lifts 5x after PROM  to improve his ability to come from sit to stand.The pt was able to simulate UB dressing 3x using resistive band. The pt was able to simulate LB donning of the resistive band 3x at MaxA incorporating the reacher for > ease.  The pt was able to complete sit to stand 4x with Mod A and heavy cues. At the end of the session, the pt remained at w/c LOF with his call light and bedside table within reach and all additional needs addressed.   Therapy Documentation Precautions:  Precautions Precautions: Fall, Cervical Precaution/Restrictions Comments: decreased recall of precautions, not able to verbalize any when prompted Required Braces or Orthoses: Cervical Brace Cervical Brace: Soft collar Restrictions Weight Bearing Restrictions Per Provider Order: No  Therapy/Group: Individual Therapy  Moises Ang 10/23/2023, 4:49 PM

## 2023-10-23 NOTE — Progress Notes (Signed)
 PROGRESS NOTE   Subjective/Complaints:  Pt doing well, feels great. Slept well, pain well managed, LBM last night was hard per pt (not per documentation though?) Says it was "like a brick" coming out because it had been 3 days since his prior BM. Doesn't like miralax  as much, but will try it mixed with a little OJ. Likes the idea of starting colace to see if this helps soften, and if effective then maybe getting rid of miralax . Foley placed yesterday, plan to leave in place for 7-10 days per urology. No issues overnight with it. Denies any other complaints or concerns.   ROS: See HPI Pt denies SOB, abd pain, CP, N/V/C/D, and vision changes   Objective:   No results found.  No results for input(s): "WBC", "HGB", "HCT", "PLT" in the last 72 hours.  Recent Labs    10/21/23 0605  NA 136  K 4.4  CL 99  CO2 27  GLUCOSE 172*  BUN 55*  CREATININE 1.84*  CALCIUM  8.8*    Intake/Output Summary (Last 24 hours) at 10/23/2023 1016 Last data filed at 10/23/2023 0615 Gross per 24 hour  Intake 476 ml  Output 2941 ml  Net -2465 ml        Physical Exam: Vital Signs Blood pressure (!) 99/42, pulse 86, temperature 97.6 F (36.4 C), temperature source Oral, resp. rate 18, height 5\' 4"  (1.626 m), weight 60.6 kg, SpO2 100%.   General: awake and alert, pleasant; NAD, laying in bed.  HENT: conjugate gaze; oropharynx moist, soft collar on.  CV: regular rate and rhythm; no JVD Pulmonary: CTA B/L; no W/R/R- good air movement GI: soft, NT, ND, (+)BS- normoactive Psychiatric: awake, alert, cooperative, pleasant Neurological: Ox3  PRIOR EXAMS: Spasticity- MAS of 1+ to 2 in Ue's this Am and 3 in RLE- no change today Neurologic: Cranial nerves II through XII intact, motor strength is 4/5 in bilateral  bicep, tricep, grip,and finger ext and hand intrinsics, 3- RIght delt and 4/5 left delt, 3- bilateral  hip flexor, 3- RIght and 4- left knee  extensors, 4/5 bilateral ankle dorsiflexor and plantar flexor Sensory exam normal sensation to light touch and proprioception in bilateral upper and lower extremities Tone- 3+ a bilateral biceps triceps and BR, + Hoffman's bilateral 2/5 RIght 3+ left knee Also MAS 3 tone in RIght Knee flexors and extensors, MAS 1 at the left knee Clonus at bilateral ankles  Musculoskeletal: Full range of motion in all 4 extremities. No joint swelling   Assessment/Plan: 1. Functional deficits which require 3+ hours per day of interdisciplinary therapy in a comprehensive inpatient rehab setting. Physiatrist is providing close team supervision and 24 hour management of active medical problems listed below. Physiatrist and rehab team continue to assess barriers to discharge/monitor patient progress toward functional and medical goals  Care Tool:  Bathing              Bathing assist Assist Level: Dependent - Patient 0%     Upper Body Dressing/Undressing Upper body dressing        Upper body assist Assist Level: Moderate Assistance - Patient 50 - 74%    Lower Body Dressing/Undressing Lower body dressing  Lower body assist Assist for lower body dressing: Dependent - Patient 0%     Toileting Toileting    Toileting assist Assist for toileting: Dependent - Patient 0%     Transfers Chair/bed transfer  Transfers assist     Chair/bed transfer assist level: Dependent - mechanical lift     Locomotion Ambulation   Ambulation assist   Ambulation activity did not occur: Safety/medical concerns          Walk 10 feet activity   Assist  Walk 10 feet activity did not occur: Safety/medical concerns        Walk 50 feet activity   Assist Walk 50 feet with 2 turns activity did not occur: Safety/medical concerns         Walk 150 feet activity   Assist Walk 150 feet activity did not occur: Safety/medical concerns         Walk 10 feet on uneven surface   activity   Assist Walk 10 feet on uneven surfaces activity did not occur: Safety/medical concerns         Wheelchair     Assist Is the patient using a wheelchair?: Yes Type of Wheelchair: Manual    Wheelchair assist level: Dependent - Patient 0% Max wheelchair distance: 150    Wheelchair 50 feet with 2 turns activity    Assist        Assist Level: Dependent - Patient 0%   Wheelchair 150 feet activity     Assist      Assist Level: Dependent - Patient 0%   Blood pressure (!) 99/42, pulse 86, temperature 97.6 F (36.4 C), temperature source Oral, resp. rate 18, height 5\' 4"  (1.626 m), weight 60.6 kg, SpO2 100%.  Medical Problem List and Plan: 1. 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  Antithrombotics: -DVT/anticoagulation:  Mechanical: Sequential compression devices, below knee Bilateral lower extremities 4/23- will see if Dr Ellery Guthrie will allow us  to start Lovenox - he said yes, so will start. 30mg  daily.             -antiplatelet therapy: N/A 3. Pain Management: tylenol  prn. Voltaren  QID, lyrica  50mg  BID, flexeril  PRN 4. Mood/Behavior/Sleep: LCSW to follow up for evaluation and support when appropriate --Delirium precautions--His mental status has cleared but will monitor for worsening with transfer to new unit .pt has not required prn meds for agitation in several days  Monitor sleep wake cycle             -antipsychotic agents: Change haldol  q 6 hrs prn to Seroquel  12.5mg  tid prn  -Restoril  7.5mg  nightly, cymbalta  20mg  daily 4/23-4/25 no delirium so far 5. Neuropsych/cognition: This patient is capable of making decisions on his own behalf. 6. Skin/Wound Care: Routine pressure relief measures. 7. Fluids/Electrolytes/Nutrition:  Monitor I/O.  Routine labs. Continue vitamins/supplements. 8. 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  -10/23/23 foley replaced yesterday by Dr. Willye Harvey, per note, leave in place for 7-10d, Consider resuming In-N-Out catheterization for management of his neurogenic bladder at that time. Recommend using a coud catheter for intermittent catheterization.  10.  Diabetes- A1c 8.0-  with peripheral neuropathy by hx only on Jardiance at home, use SSI in the hospital  -10/23/23 CBGs variable but likely d/t steroids; cont SSI for now  CBG (last 3)  Recent Labs    10/22/23 1626 10/22/23 2041 10/23/23 0604  GLUCAP 287* 252* 180*     11.  Spasticity affecting LEs> UE and R>L , aggressive ROM  with PT, consider oral meds  4/22- starting baclofen  5 mg TID- with his Cr 1.50- has been running 1.68 to 2.30  4/24- will not increase baclofen  due to Renal issues 12.  PAD s/p SFA and Iliac stenting, continue metoprolol  50mg  daily  13. CKD3b? With AKI -4/22- Pt's Cr down to 1.50 from 2.29 2 days ago and 1.68 yesterday- will monitor 2x/week to see if will reduce further or be labile.   4/24- Cr 1.84- and BUN stable at 55- is drinking per pt 14. Questionable  neurogenic bowel: continue miralax  32g daily, senokot s 2 tabs qAM 4/24- required suppository 2 days ago however had BM on bedpan yesterday- thinks is mild.   4/25- smear that was incontinent last night -10/23/23 LBM last night per pt was very hard still, but documented as loose-- doesn't like miralax  (on 32g daily)-- will add colace 200mg  daily, see if this helps, if continued loose stools then would stop the miralax  first. Can mix miralax  with half juice/half water.   15. HLD: crestor  20mg  nightly   LOS: 5 days A FACE TO FACE EVALUATION WAS PERFORMED  [address] 10/23/2023, 10:16 AM

## 2023-10-23 NOTE — Progress Notes (Signed)
 Pt started on Restoril  7.5 mg last night for insomnia and tolerated medication well with no adverse reactions noted. Pt slept well throughout the night. Foley intact and draining clear yellow urine. No c/o pain reported or acute distress noted during shift. Bed locked and in lowest position with call bell in reach.

## 2023-10-23 NOTE — Progress Notes (Signed)
 Occupational Therapy Session Note  Patient Details  Name: Christian Bailey MRN: [id number] Date of Birth: Sep 29, 1940  Today's Date: 10/23/2023 OT Individual Time: 4098-1191 OT Individual Time Calculation (min): 44 min    Short Term Goals: Week 1:  OT Short Term Goal 1 (Week 1): Pt will complete self feeding with Ae as necessary at Min A OT Short Term Goal 2 (Week 1): Pt will complete sit to stand with LRAD at Mod A in preparation for ADLs OT Short Term Goal 3 (Week 1): Pt will complete LB dressing at Max A with AE as necessary   Skilled Therapeutic Interventions/Progress Updates:    Pt bed level at time of session, note pt talking to himself thinking someone was present - had to orient to place and date. No pain at rest but some pain in shoulder area with positional changes - rest breaks provided. Pt did not rate a number. Pt on bed pan at this time, NT present as well for hygiene and cleaning. Note no BM - just gass pt had passed. Rolling MAX A and total A for pants back over hips. Supine > sit MAX A and extended time sitting to adjust. Scissoring tendencies and hard adduction in BLEs this date - stedy transfer to wheelchair and total A for repositoining. BP checked at 109/71 with HR at 90. Nursing present at end of session for med pass and hand off to nursing.   Therapy Documentation Precautions:  Precautions Precautions: Fall, Cervical Precaution/Restrictions Comments: decreased recall of precautions, not able to verbalize any when prompted Required Braces or Orthoses: Cervical Brace Cervical Brace: Soft collar Restrictions Weight Bearing Restrictions Per Provider Order: No     Therapy/Group: Individual Therapy  Doroteo Gasmen 10/23/2023, 7:25 AM

## 2023-10-24 DIAGNOSIS — K592 Neurogenic bowel, not elsewhere classified: Secondary | ICD-10-CM | POA: Diagnosis not present

## 2023-10-24 DIAGNOSIS — N319 Neuromuscular dysfunction of bladder, unspecified: Secondary | ICD-10-CM | POA: Diagnosis not present

## 2023-10-24 DIAGNOSIS — G8250 Quadriplegia, unspecified: Secondary | ICD-10-CM | POA: Diagnosis not present

## 2023-10-24 DIAGNOSIS — R739 Hyperglycemia, unspecified: Secondary | ICD-10-CM | POA: Diagnosis not present

## 2023-10-24 LAB — GLUCOSE, CAPILLARY
Glucose-Capillary: 205 mg/dL — ABNORMAL HIGH (ref 70–99)
Glucose-Capillary: 218 mg/dL — ABNORMAL HIGH (ref 70–99)
Glucose-Capillary: 205 mg/dL — ABNORMAL HIGH (ref 70–99)
Glucose-Capillary: 229 mg/dL — ABNORMAL HIGH (ref 70–99)

## 2023-10-24 MED ORDER — MUSCLE RUB 10-15 % EX CREA
TOPICAL_CREAM | CUTANEOUS | Status: DC | PRN
Start: 2023-10-24 — End: 2023-11-24
  Administered 2023-10-24 – 2023-11-21 (×3): 1 via TOPICAL
  Filled 2023-10-24 (×3): qty 85

## 2023-10-24 NOTE — Plan of Care (Signed)
  Problem: Consults Goal: RH GENERAL PATIENT EDUCATION Description: See Patient Education module for education specifics. Outcome: Progressing   Problem: RH BOWEL ELIMINATION Goal: RH STG MANAGE BOWEL WITH ASSISTANCE Description: STG Manage Bowel with mod I Assistance. Outcome: Progressing Goal: RH STG MANAGE BOWEL W/MEDICATION W/ASSISTANCE Description: STG Manage Bowel with Medication with mod I Assistance. Outcome: Progressing

## 2023-10-24 NOTE — Progress Notes (Signed)
 PROGRESS NOTE   Subjective/Complaints:  Pt doing well, but legs a little sore today. Slept well, pain well managed otherwise, LBM yesterday, a little soft so will stop miralax . Foley without issues. Denies any other complaints or concerns.   ROS: See HPI Pt denies SOB, abd pain, CP, N/V/C/D, and vision changes   Objective:   No results found.  No results for input(s): "WBC", "HGB", "HCT", "PLT" in the last 72 hours.  No results for input(s): "NA", "K", "CL", "CO2", "GLUCOSE", "BUN", "CREATININE", "CALCIUM " in the last 72 hours.   Intake/Output Summary (Last 24 hours) at 10/24/2023 1035 Last data filed at 10/24/2023 0816 Gross per 24 hour  Intake 777 ml  Output 800 ml  Net -23 ml        Physical Exam: Vital Signs Blood pressure (!) 113/48, pulse 79, temperature (!) 97.5 F (36.4 C), temperature source Oral, resp. rate 18, height 5\' 4"  (1.626 m), weight 60.6 kg, SpO2 95%.   General: awake and alert, pleasant; NAD, laying in bed comfortably.  HENT: conjugate gaze; oropharynx moist, soft collar on.  CV: regular rate and rhythm; no JVD Pulmonary: CTA B/L; no W/R/R- good air movement GI: soft, NT, ND, (+)BS- normoactive Psychiatric: awake, alert, cooperative, pleasant Neurological: Ox3  PRIOR EXAMS: Spasticity- MAS of 1+ to 2 in Ue's this Am and 3 in RLE- no change today Neurologic: Cranial nerves II through XII intact, motor strength is 4/5 in bilateral  bicep, tricep, grip,and finger ext and hand intrinsics, 3- RIght delt and 4/5 left delt, 3- bilateral  hip flexor, 3- RIght and 4- left knee extensors, 4/5 bilateral ankle dorsiflexor and plantar flexor Sensory exam normal sensation to light touch and proprioception in bilateral upper and lower extremities Tone- 3+ a bilateral biceps triceps and BR, + Hoffman's bilateral 2/5 RIght 3+ left knee Also MAS 3 tone in RIght Knee flexors and extensors, MAS 1 at the left  knee Clonus at bilateral ankles  Musculoskeletal: Full range of motion in all 4 extremities. No joint swelling   Assessment/Plan: 1. Functional deficits which require 3+ hours per day of interdisciplinary therapy in a comprehensive inpatient rehab setting. Physiatrist is providing close team supervision and 24 hour management of active medical problems listed below. Physiatrist and rehab team continue to assess barriers to discharge/monitor patient progress toward functional and medical goals  Care Tool:  Bathing              Bathing assist Assist Level: Dependent - Patient 0%     Upper Body Dressing/Undressing Upper body dressing        Upper body assist Assist Level: Moderate Assistance - Patient 50 - 74%    Lower Body Dressing/Undressing Lower body dressing            Lower body assist Assist for lower body dressing: Dependent - Patient 0%     Toileting Toileting    Toileting assist Assist for toileting: Dependent - Patient 0%     Transfers Chair/bed transfer  Transfers assist     Chair/bed transfer assist level: Dependent - mechanical lift     Locomotion Ambulation   Ambulation assist   Ambulation activity did not  occur: Safety/medical concerns          Walk 10 feet activity   Assist  Walk 10 feet activity did not occur: Safety/medical concerns        Walk 50 feet activity   Assist Walk 50 feet with 2 turns activity did not occur: Safety/medical concerns         Walk 150 feet activity   Assist Walk 150 feet activity did not occur: Safety/medical concerns         Walk 10 feet on uneven surface  activity   Assist Walk 10 feet on uneven surfaces activity did not occur: Safety/medical concerns         Wheelchair     Assist Is the patient using a wheelchair?: Yes Type of Wheelchair: Manual    Wheelchair assist level: Dependent - Patient 0% Max wheelchair distance: 150    Wheelchair 50 feet with 2 turns  activity    Assist        Assist Level: Dependent - Patient 0%   Wheelchair 150 feet activity     Assist      Assist Level: Dependent - Patient 0%   Blood pressure (!) 113/48, pulse 79, temperature (!) 97.5 F (36.4 C), temperature source Oral, resp. rate 18, height 5\' 4"  (1.626 m), weight 60.6 kg, SpO2 95%.  Medical Problem List and Plan: 1. 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  Antithrombotics: -DVT/anticoagulation:  Mechanical: Sequential compression devices, below knee Bilateral lower extremities 4/23- will see if Dr Ellery Guthrie will allow us  to start Lovenox - he said yes, so will start. 30mg  daily.             -antiplatelet therapy: N/A 3. Pain Management: tylenol  prn. Voltaren  QID, lyrica  50mg  BID, flexeril  PRN -10/24/23 leg muscle soreness likely from PT/OT this week; will start muscle rub cream 4. Mood/Behavior/Sleep: LCSW to follow up for evaluation and support when appropriate --Delirium precautions--His mental status has cleared but will monitor for worsening with transfer to new unit .pt has not required prn meds for agitation in several days  Monitor sleep wake cycle             -antipsychotic agents: Change haldol  q 6 hrs prn to Seroquel  12.5mg  tid prn  -Restoril  7.5mg  nightly, cymbalta  20mg  daily 4/23-4/25 no delirium so far 5. Neuropsych/cognition: This patient is capable of making decisions on his own behalf. 6. Skin/Wound Care: Routine pressure relief measures. 7. Fluids/Electrolytes/Nutrition:  Monitor I/O. Routine labs. Continue vitamins/supplements. 8. 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  Urinary retention - Neurogenic bladder   d/t cervical myelopathy plus possible diabetic cystopathy , pt c/o poor stream and dribbling for ~95mo PTA, did not see a urologist,  -Was on Myrbetriq , which may exacerbate overflow incont, consider d/c prior to voiding trial , place on flomax   -4/22- Foley removed this AM- too soon to see if he will void or need in/out cath yet- will cath if volumes >350cc -4/23- Needed cathing at least 2 overnight- will start Flomax  0.4 mg q supper to try and help pt void -4/24-  no side effects with Flomax - still voiding some, but still needing cathing- -4/25- Still requiring caths q6 hours- with volumes 400-650cc- still voiding some.  -10/23/23 foley replaced yesterday by Dr. Willye Harvey, per note, leave in place for 7-10d, Consider resuming In-N-Out catheterization for management of his neurogenic bladder at that time. Recommend using a coud catheter for intermittent catheterization (when resumed).  10.  Diabetes- A1c 8.0-  with peripheral neuropathy by hx only on Jardiance at home, use SSI in the hospital  -10/23/23 CBGs variable but likely d/t steroids; cont SSI for now  -10/24/23 CBGs a little better today; monitor  CBG (last 3)  Recent Labs    10/23/23 1624 10/23/23 2037 10/24/23 0610  GLUCAP 287* 269* 205*     11.  Spasticity affecting LEs> UE and R>L , aggressive ROM  with PT, consider oral meds  4/22- starting baclofen  5 mg TID- with his Cr 1.50- has been running 1.68 to 2.30  4/24- will not increase baclofen  due to Renal issues 12.  PAD s/p SFA and Iliac stenting, continue metoprolol  50mg  daily  13. CKD3b? With AKI -4/22- Pt's Cr down to 1.50 from 2.29 2 days ago and 1.68 yesterday- will monitor 2x/week to see if will reduce further or be labile.   4/24- Cr 1.84- and BUN stable at 55- is drinking per pt 14. Questionable neurogenic bowel: continue miralax  32g daily, senokot s 2 tabs qAM 4/24- required suppository 2 days ago however had BM on bedpan yesterday- thinks is mild.   4/25- smear that was  incontinent last night -10/23/23 LBM last night per pt was very hard still, but documented as loose-- doesn't like miralax  (on 32g daily)-- will add colace 200mg  daily, see if this helps, if continued loose stools then would stop the miralax  first. Can mix miralax  with half juice/half water.  -10/24/23 looser stools yesterday, will stop miralax , leave colace-- pt prefers this  15. HLD: crestor  20mg  nightly   LOS: 6 days A FACE TO FACE EVALUATION WAS PERFORMED  [address] 10/24/2023, 10:35 AM

## 2023-10-25 ENCOUNTER — Inpatient Hospital Stay (HOSPITAL_COMMUNITY)

## 2023-10-25 DIAGNOSIS — R4182 Altered mental status, unspecified: Secondary | ICD-10-CM | POA: Diagnosis not present

## 2023-10-25 DIAGNOSIS — F418 Other specified anxiety disorders: Secondary | ICD-10-CM | POA: Diagnosis not present

## 2023-10-25 DIAGNOSIS — Z96611 Presence of right artificial shoulder joint: Secondary | ICD-10-CM | POA: Diagnosis not present

## 2023-10-25 DIAGNOSIS — G8250 Quadriplegia, unspecified: Secondary | ICD-10-CM | POA: Diagnosis not present

## 2023-10-25 DIAGNOSIS — G959 Disease of spinal cord, unspecified: Secondary | ICD-10-CM | POA: Diagnosis not present

## 2023-10-25 LAB — GLUCOSE, CAPILLARY
Glucose-Capillary: 187 mg/dL — ABNORMAL HIGH (ref 70–99)
Glucose-Capillary: 156 mg/dL — ABNORMAL HIGH (ref 70–99)
Glucose-Capillary: 383 mg/dL — ABNORMAL HIGH (ref 70–99)
Glucose-Capillary: 99 mg/dL (ref 70–99)

## 2023-10-25 LAB — CBC WITH DIFFERENTIAL/PLATELET
Abs Immature Granulocytes: 0.04 10*3/uL (ref 0.00–0.07)
Basophils Absolute: 0.1 10*3/uL (ref 0.0–0.1)
Basophils Relative: 1 %
Eosinophils Absolute: 0.5 10*3/uL (ref 0.0–0.5)
Eosinophils Relative: 6 %
HCT: 28.9 % — ABNORMAL LOW (ref 39.0–52.0)
Hemoglobin: 9.5 g/dL — ABNORMAL LOW (ref 13.0–17.0)
Immature Granulocytes: 1 %
Lymphocytes Relative: 13 %
Lymphs Abs: 1 10*3/uL (ref 0.7–4.0)
MCH: 29.3 pg (ref 26.0–34.0)
MCHC: 32.9 g/dL (ref 30.0–36.0)
MCV: 89.2 fL (ref 80.0–100.0)
Monocytes Absolute: 0.5 10*3/uL (ref 0.1–1.0)
Monocytes Relative: 7 %
Neutro Abs: 5.8 10*3/uL (ref 1.7–7.7)
Neutrophils Relative %: 72 %
Platelets: 437 10*3/uL — ABNORMAL HIGH (ref 150–400)
RBC: 3.24 MIL/uL — ABNORMAL LOW (ref 4.22–5.81)
RDW: 14.3 % (ref 11.5–15.5)
WBC: 7.9 10*3/uL (ref 4.0–10.5)
nRBC: 0 % (ref 0.0–0.2)

## 2023-10-25 LAB — URINALYSIS, W/ REFLEX TO CULTURE (INFECTION SUSPECTED)
Bilirubin Urine: NEGATIVE
Glucose, UA: NEGATIVE mg/dL
Ketones, ur: NEGATIVE mg/dL
Nitrite: NEGATIVE
Protein, ur: NEGATIVE mg/dL
Specific Gravity, Urine: 1.008 (ref 1.005–1.030)
pH: 5 (ref 5.0–8.0)

## 2023-10-25 LAB — BASIC METABOLIC PANEL WITH GFR
Anion gap: 11 (ref 5–15)
BUN: 72 mg/dL — ABNORMAL HIGH (ref 8–23)
CO2: 27 mmol/L (ref 22–32)
Calcium: 9 mg/dL (ref 8.9–10.3)
Chloride: 98 mmol/L (ref 98–111)
Creatinine, Ser: 1.63 mg/dL — ABNORMAL HIGH (ref 0.61–1.24)
GFR, Estimated: 42 mL/min — ABNORMAL LOW (ref >60)
Glucose, Bld: 197 mg/dL — ABNORMAL HIGH (ref 70–99)
Potassium: 4.1 mmol/L (ref 3.5–5.1)
Sodium: 136 mmol/L (ref 135–145)

## 2023-10-25 MED ORDER — SODIUM CHLORIDE 0.9 % IV SOLN: INTRAVENOUS | Status: DC

## 2023-10-25 NOTE — Progress Notes (Signed)
 Patient with fatigue and confusion today per reports. Sleeping a lot per nursing.  Acute on chronic renal failure has been monitored with improvement in SCr but BUN to 72 likely due to azotemia-->IVF added. On baclofen  since 04/22. Not on any narcotics--may be SE of Restoril  which was added on 04/25 and noted to be sleepy this weekend--->has been d/c.  Leucocytosis has resolved and no fevers but will rule out infectious source --Check UA/CXR. Repeat labs in am.

## 2023-10-25 NOTE — Plan of Care (Signed)
 Goals revised d/t lack of progress, see below. Problem: Sit to Stand Goal: LTG:  Patient will perform sit to stand in prep for activites of daily living with assistance level (OT) Description: LTG:  Patient will perform sit to stand in prep for activites of daily living with assistance level (OT) Flowsheets (Taken 10/25/2023 1554) LTG: PT will perform sit to stand in prep for activites of daily living with assistance level: (pt lack of progress) Moderate Assistance - Patient 50 - 74%   Problem: RH Bathing Goal: LTG Patient will bathe all body parts with assist levels (OT) Description: LTG: Patient will bathe all body parts with assist levels (OT) Flowsheets (Taken 10/25/2023 1554) LTG: Pt will perform bathing with assistance level/cueing: (pt lack of progress) Moderate Assistance - Patient 50 - 74%   Problem: RH Dressing Goal: LTG Patient will perform upper body dressing (OT) Description: LTG Patient will perform upper body dressing with assist, with/without cues (OT). Flowsheets (Taken 10/25/2023 1554) LTG: Pt will perform upper body dressing with assistance level of: (pt lack of progress) Moderate Assistance - Patient 50 - 74% Goal: LTG Patient will perform lower body dressing w/assist (OT) Description: LTG: Patient will perform lower body dressing with assist, with/without cues in positioning using equipment (OT) Flowsheets (Taken 10/25/2023 1554) LTG: Pt will perform lower body dressing with assistance level of: (pt lack of progress) Moderate Assistance - Patient 50 - 74%   Problem: RH Toileting Goal: LTG Patient will perform toileting task (3/3 steps) with assistance level (OT) Description: LTG: Patient will perform toileting task (3/3 steps) with assistance level (OT)  Flowsheets (Taken 10/25/2023 1554) LTG: Pt will perform toileting task (3/3 steps) with assistance level: (pt lack of progress) Moderate Assistance - Patient 50 - 74%   Problem: RH Toilet Transfers Goal: LTG Patient will  perform toilet transfers w/assist (OT) Description: LTG: Patient will perform toilet transfers with assist, with/without cues using equipment (OT) Flowsheets (Taken 10/25/2023 1554) LTG: Pt will perform toilet transfers with assistance level of: (pt lack of progress) Moderate Assistance - Patient 50 - 74%   Problem: RH Tub/Shower Transfers Goal: LTG Patient will perform tub/shower transfers w/assist (OT) Description: LTG: Patient will perform tub/shower transfers with assist, with/without cues using equipment (OT) Flowsheets (Taken 10/25/2023 1554) LTG: Pt will perform tub/shower stall transfers with assistance level of: (pt lack of progress) Moderate Assistance - Patient 50 - 74%

## 2023-10-25 NOTE — Consult Note (Signed)
  10/25/2023 9 AM:  Today attempted to visit with Christian Bailey, who is an 83 year old male referred for neuropsychological consultation due to coping and adjustment issues after cervical injury from recent fall and episodes of delirium and agitation immediately postsurgery.  Patient has a history of anxiety with depression along with his other medical conditions.  When I went into visit the patient at 9 this morning I was unable to get him to awaken.  Sternal rub gentle shaking of shoulder were tried after raised voice calling to the patient.  Patient is hard of hearing.  Patient at most tried to open his eyes on 1 occasion but was not able to wake up.  The patient's PA went in after me and also had difficulty arousing the patient.  However, patient has a history of difficulty waking up in the morning prior to 10 AM.  Apparently this is not an unusual circumstance.  I will attempt to follow-up with the patient later in this week.

## 2023-10-25 NOTE — Progress Notes (Signed)
 PROGRESS NOTE   Subjective/Complaints:  Pt reports isn't being cathed anymore- since Foley was placed Friday night-  Doesn't remember LBM- was 4/26- 2 days ago.  Pt reports his legs continue to hurt- but doesn't remember was here for neck surgery- Per d/w nursing, was somewhat confused yesterday AM when still asleep/muzzy headed, but by 10am, was completely clear- appears PA note is c/w this.     ROS: See HPI  Pt denies SOB, abd pain, CP, N/V/C/D, and vision changes   Objective:   No results found.  Recent Labs    10/25/23 0509  WBC 7.9  HGB 9.5*  HCT 28.9*  PLT 437*    Recent Labs    10/25/23 0509  NA 136  K 4.1  CL 98  CO2 27  GLUCOSE 197*  BUN 72*  CREATININE 1.63*  CALCIUM  9.0     Intake/Output Summary (Last 24 hours) at 10/25/2023 0824 Last data filed at 10/25/2023 0535 Gross per 24 hour  Intake 476 ml  Output 1900 ml  Net -1424 ml        Physical Exam: Vital Signs Blood pressure 135/69, pulse 71, temperature (!) 97.5 F (36.4 C), temperature source Axillary, resp. rate 16, height 5\' 4"  (1.626 m), weight 60.6 kg, SpO2 100%.    General: awoke from sleep- hard to do so; NAD HENT: conjugate gaze; oropharynx moist CV: regular rate and rhythm; no JVD Pulmonary: CTA B/L; no W/R/R- good air movement GI: soft, NT, ND, (+)BS Psychiatric: confused but pleasant- sleepy Neurological: somewhat confused doesn't know in hospital or here for neck surgery- very sleepy GU- foley in place- no bleeding  PRIOR EXAMS: Spasticity- MAS of 1+ to 2 in Ue's this Am and 3 in RLE- no change today Neurologic: Cranial nerves II through XII intact, motor strength is 4/5 in bilateral  bicep, tricep, grip,and finger ext and hand intrinsics, 3- RIght delt and 4/5 left delt, 3- bilateral  hip flexor, 3- RIght and 4- left knee extensors, 4/5 bilateral ankle dorsiflexor and plantar flexor Sensory exam normal sensation to  light touch and proprioception in bilateral upper and lower extremities Tone- 3+ a bilateral biceps triceps and BR, + Hoffman's bilateral 2/5 RIght 3+ left knee Also MAS 3 tone in RIght Knee flexors and extensors, MAS 1 at the left knee Clonus at bilateral ankles  Musculoskeletal: Full range of motion in all 4 extremities. No joint swelling   Assessment/Plan: 1. Functional deficits which require 3+ hours per day of interdisciplinary therapy in a comprehensive inpatient rehab setting. Physiatrist is providing close team supervision and 24 hour management of active medical problems listed below. Physiatrist and rehab team continue to assess barriers to discharge/monitor patient progress toward functional and medical goals  Care Tool:  Bathing              Bathing assist Assist Level: Dependent - Patient 0%     Upper Body Dressing/Undressing Upper body dressing        Upper body assist Assist Level: Moderate Assistance - Patient 50 - 74%    Lower Body Dressing/Undressing Lower body dressing            Lower body  assist Assist for lower body dressing: Dependent - Patient 0%     Toileting Toileting    Toileting assist Assist for toileting: Dependent - Patient 0%     Transfers Chair/bed transfer  Transfers assist     Chair/bed transfer assist level: Dependent - mechanical lift     Locomotion Ambulation   Ambulation assist   Ambulation activity did not occur: Safety/medical concerns          Walk 10 feet activity   Assist  Walk 10 feet activity did not occur: Safety/medical concerns        Walk 50 feet activity   Assist Walk 50 feet with 2 turns activity did not occur: Safety/medical concerns         Walk 150 feet activity   Assist Walk 150 feet activity did not occur: Safety/medical concerns         Walk 10 feet on uneven surface  activity   Assist Walk 10 feet on uneven surfaces activity did not occur: Safety/medical  concerns         Wheelchair     Assist Is the patient using a wheelchair?: Yes Type of Wheelchair: Manual    Wheelchair assist level: Dependent - Patient 0% Max wheelchair distance: 150    Wheelchair 50 feet with 2 turns activity    Assist        Assist Level: Dependent - Patient 0%   Wheelchair 150 feet activity     Assist      Assist Level: Dependent - Patient 0%   Blood pressure 135/69, pulse 71, temperature (!) 97.5 F (36.4 C), temperature source Axillary, resp. rate 16, height 5\' 4"  (1.626 m), weight 60.6 kg, SpO2 100%.  Medical Problem List and Plan: 1. 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  Antithrombotics: -DVT/anticoagulation:  Mechanical: Sequential compression devices, below knee Bilateral lower extremities 4/23- will see if Dr Ellery Guthrie will allow us  to start Lovenox - he said yes, so will start. 30mg  daily.             -antiplatelet therapy: N/A 3. Pain Management: tylenol  prn. Voltaren  QID, lyrica  50mg  BID, flexeril  PRN -10/24/23 leg muscle soreness likely from PT/OT this week; will start muscle rub cream 4/28- still c/o muscle soreness in legs- con't pain regimen as needed 4. Mood/Behavior/Sleep: LCSW to follow up for evaluation and support when appropriate --Delirium precautions--His mental status has cleared but will monitor for worsening with transfer to new unit .pt has not required prn meds for agitation in several days  Monitor sleep wake cycle             -antipsychotic agents: Change haldol  q 6 hrs prn to Seroquel  12.5mg  tid prn  -Restoril  7.5mg  nightly, cymbalta  20mg  daily 4/23-4/25 no  delirium so far 4/28- having some delirium this AM? Not severe, but will monitor since has had in recent past 5. Neuropsych/cognition: This patient is usually  capable of making decisions on his own behalf. 6. Skin/Wound Care: Routine pressure relief measures. 7.  Fluids/Electrolytes/Nutrition:  Monitor I/O. Routine labs. Continue vitamins/supplements. 8. 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  Urinary retention - Neurogenic bladder  d/t cervical myelopathy plus possible diabetic cystopathy , pt c/o poor stream and dribbling for ~87mo PTA, did not see a urologist,  -Was on Myrbetriq , which may exacerbate overflow incont, consider d/c prior to voiding trial , place on flomax   -4/22- Foley removed this AM- too soon to see if he will void or need in/out cath yet- will cath if volumes >350cc -4/23- Needed cathing at least 2 overnight- will start Flomax  0.4 mg q supper to try and help pt void -4/24- no side effects with Flomax - still voiding some, but still needing cathing- -4/25- Still requiring caths q6 hours- with volumes 400-650cc- still voiding some.  -10/23/23 foley replaced yesterday by Dr. Willye Harvey, per note, leave in place for 7-10d, Consider resuming In-N-Out catheterization for management of his neurogenic bladder at that time. Recommend using a coud catheter for intermittent catheterization (when resumed).  4/28- will leave foley for 7-10 days 10.  Diabetes- A1c 8.0-  with peripheral neuropathy by hx only on Jardiance at home, use SSI in the hospital  -10/23/23 CBGs variable but likely d/t steroids; cont SSI for now  -10/24/23 CBGs a little better today; monitor  4/28- BG a little better this AM at 187- if not better by tomorrow, will try to add something for CBG's CBG (last 3)  Recent Labs    10/24/23 1619 10/24/23 2047 10/25/23 0612  GLUCAP 205* 229* 187*     11.   Spasticity affecting LEs> UE and R>L , aggressive ROM  with PT, consider oral meds  4/22- starting baclofen  5 mg TID- with his Cr 1.50- has been running 1.68 to 2.30  4/24- will not increase baclofen  due to Renal issues 12.  PAD s/p SFA and Iliac stenting, continue metoprolol  50mg  daily  13. CKD3b? With AKI -4/22- Pt's Cr down to 1.50 from 2.29 2 days ago and 1.68 yesterday- will monitor 2x/week to see if will reduce further or be labile.   4/24- Cr 1.84- and BUN stable at 55- is drinking per pt  4/28- Cr 1.63- doing better 14. Questionable neurogenic bowel: continue miralax  32g daily, senokot s 2 tabs qAM 4/24- required suppository 2 days ago however had BM on bedpan yesterday- thinks is mild.   4/25- smear that was incontinent last night -10/23/23 LBM last night per pt was very hard still, but documented as loose-- doesn't like miralax  (on 32g daily)-- will add colace 200mg  daily, see if this helps, if continued loose stools then would stop the miralax  first. Can mix miralax  with half juice/half water.  -10/24/23 looser stools yesterday, will stop miralax , leave colace-- pt prefers this  4/28- LBM 2 days ago 15. HLD: crestor  20mg  nightly    I spent a total of 39   minutes on total care today- >50% coordination of care- due to  D/w nursing at length and reviewing chart when in room with pt- also review of notes and labs for last 3 days-  Will wait to monitor confusion before intervening- think it could be from sleepiness. And recent delirium    LOS: 7 days A FACE TO FACE EVALUATION WAS PERFORMED  Liberti Appleton 10/25/2023, 8:24 AM

## 2023-10-25 NOTE — Progress Notes (Signed)
 Physical Therapy Session Note  Patient Details  Name: Christian Bailey MRN: [id number] Date of Birth: 1940/10/26  Today's Date: 10/25/2023 PT Individual Time: 6967-8938 PT Individual Time Calculation (min): 60 min   Today's Date: 10/25/2023 PT Individual Time: 1017-5102 PT Individual Time Calculation (min): 26 min   Short Term Goals: Week 1:  PT Short Term Goal 1 (Week 1): Pt will initiate gait training PT Short Term Goal 2 (Week 1): Pt will perform STS with mod a consistently PT Short Term Goal 3 (Week 1): Pt will perform bed mobility with mod a  Skilled Therapeutic Interventions/Progress Updates:     1st Sessions: Pt received seated in til in space [facility] and agrees to therapy. No complaint of pain. WC transport to gym for time management. Pt performs sit to stand with modA and cues for anterior weight shifting and powering up, with PT providing manual facilitation of hip extension and trunk extension. Verbal and tactile cues provided to facilitate glute engagement, with manual facilitation of lateral weight shifting. Following extended seated rest break, pt stands and ambulates x5' with MaxA +2 with manual facilitation of progression of each leg, and PT blocking stance knee. PT positioned in front of pt to provide manual facilitation of hip and trunk extension. Pt requires more assistance with RLE relative to LLE, but essentially requires totalA for management of each leg.  Following extended seated rest break, pt performs sit to stand in parallel bars with similar assistance and cues, but reports needing to have bowel movement immediately upon standing. WC transport back to room. Pt performs sit to stand to [place] with modA and Stedy utilized for transfer to [facility]. Pt is continent of bowel and requires modA for sit to stand back to [place], and PT performs pericare with pt in standing to work on endurance. Stedy transfer back to [facility]. Left seated with alarm intact and all needs within reach.   2nd Session:  Pt received seated in tilt in space WC and agrees to therapy. No complaint of pain. WC transport to gym for time management. Pt performs sit to stand and standing activity with use of parallel bar for upper extremity support and mirror for visual feedback. PT and rehab tech provide modA +2 for sit to stand, with cues for hand placement, trunk position, and sequencing. Assistance required to fully extend both knees but once in extension, pt is able to remain standing without knee blocking. Manual cueing provided for hip and trunk extension, with verbal cues for glute activation. Pt remains standing x2-3:00 each bout, with extended seated rest breaks between each stand. Pt completes x4 bouts total. WC transport to room. Left seated with alarm intact and all needs within reach.   Therapy Documentation Precautions:  Precautions Precautions: Fall, Cervical Precaution/Restrictions Comments: decreased recall of precautions, not able to verbalize any when prompted Required Braces or Orthoses: Cervical Brace Cervical Brace: Soft collar Restrictions Weight Bearing Restrictions Per Provider Order: No   Therapy/Group: Individual Therapy  Neva Barban, PT, DPT 10/25/2023, 3:49 PM

## 2023-10-25 NOTE — Progress Notes (Signed)
 Occupational Therapy Weekly Progress Note  Patient Details  Name: Christian Bailey MRN: [id number] Date of Birth: Sep 07, 1940  Beginning of progress report period: October 19, 2023 End of progress report period: October 26, 2023  {CHL IP REHAB OT TIME CALCULATIONS:[id number]}   Patient has met 1 of 3 short term goals.  [name] has has slow progress of regaining independence with ADLs d/t decreased ROM in BUE at baseline and increasing BLE tone/spasticity. Pt has had increased independence with self feeding and grooming tasks. OT spoke with social worker to plan family meeting.   Patient continues to demonstrate the following deficits: muscle weakness and muscle joint tightness, decreased cardiorespiratoy endurance, and decreased sitting balance, decreased standing balance, decreased postural control, and decreased balance strategies and therefore will continue to benefit from skilled OT intervention to enhance overall performance with BADL and Reduce care partner burden.  Patient not progressing toward long term goals.  See goal revision.  Plan of care revisions: decreased goals to Mod A d/t overall decreased progress, increased tone/spasticity.  OT Short Term Goals Week 1:  OT Short Term Goal 1 (Week 1): Pt will complete self feeding with Ae as necessary at Min A OT Short Term Goal 2 (Week 1): Pt will complete sit to stand with LRAD at Mod A in preparation for ADLs OT Short Term Goal 3 (Week 1): Pt will complete LB dressing at Max A with AE as necessary Week 2:     Skilled Therapeutic Interventions/Progress Updates:      Therapy Documentation Precautions:  Precautions Precautions: Fall, Cervical Precaution/Restrictions Comments: decreased recall of precautions, not able to verbalize any when prompted Required Braces or Orthoses: Cervical Brace Cervical Brace: Soft collar Restrictions Weight Bearing Restrictions Per Provider Order: No General: "subjective***" Pt supine in bed upon OT  arrival, agreeable to OT session. "subjective***" Pt seated in W/C upon OT arrival, agreeable to OT.  Vital Signs:  Pain:  ADL:  Balance  Exercises:  Other Treatments:    ***Pt seated in W/C at end of session with W/C alarm donned, call light within reach and 4Ps assessed.  ***Pt supine in bed with bed alarm activated, 2 bed rails up, call light within reach and 4Ps assessed.'   Therapy/Group: Individual Therapy  Nila Barth, OTD, OTR/L 10/25/2023, 4:13 PM

## 2023-10-25 NOTE — Progress Notes (Addendum)
 Occupational Therapy Session Note  Patient Details  Name: Christian Bailey MRN: [id number] Date of Birth: Mar 15, 1941  Today's Date: 10/25/2023 OT Individual Time: 1300-1420 OT Individual Time Calculation (min): 80 min    Short Term Goals: Week 1:  OT Short Term Goal 1 (Week 1): Pt will complete self feeding with Ae as necessary at Min A OT Short Term Goal 2 (Week 1): Pt will complete sit to stand with LRAD at Mod A in preparation for ADLs OT Short Term Goal 3 (Week 1): Pt will complete LB dressing at Max A with AE as necessary  Skilled Therapeutic Interventions/Progress Updates:      Therapy Documentation Precautions:  Precautions Precautions: Fall, Cervical Precaution/Restrictions Comments: decreased recall of precautions, not able to verbalize any when prompted Required Braces or Orthoses: Cervical Brace Cervical Brace: Soft collar Restrictions Weight Bearing Restrictions Per Provider Order: No General: "I love making people laugh" Pt supine in bed upon OT arrival receiving nsg care, agreeable to OT session. Pt re-connected to IV fluids during session, OT assisting nsg with toileting at bed level total A d/t incontinence of bowel. Pt exhibiting some confusion during session, stating he though he was "at home". Was oriented to year and month. Nsg aware.  Pain: unrated pain reported in neck, positioning, activity, intermittent rest breaks, distractions provided for pain management, pt reports tolerable to proceed.   ADL: OT providing skilled intervention on ADL retraining in order to increase independence with tasks and increase activity tolerance. Pt completed the following tasks at the current level of assist: Bed mobility: Max A, AAROM to guide hand to bed rail, able to slightly pull although still requiring Max A to roll side to side, Max A from supine>sit, CGA for static sitting once good BOS on floor and BUE support LB dressing: total A bed level for changing brief and fixing foley  d/t being incorrectly threaded into wrong pant leg from prior  Transfers: Max A +1 with transfer board and Max verbal/tactile cueing for correct weight shifting/anterior lean as well as hand placement   Other Treatments: OT discussing with pt that he will need physical assistance once D/C and would potentially benefit from family meeting in order to explain pt needs and level of physical assistance required at D/C. OT spoke with SW to schedule family meeting and will also discuss tomorrow in conference. OT also educating pt on potential options for W/C and recommendation for power mobility d/t decreased BUE ROM/mobility and increased independence. Pt unsure if it would fit through doorways in home. OT educated pt would need alternative transportation d/t size and weight of W/C. OT also providing gentle ROM and stretching of BLE for tone/spasticity management at start of session.   Pt seated in W/C at end of session with W/C alarm donned, call light within reach and 4Ps assessed.    Therapy/Group: Individual Therapy  Nila Barth, OTD, OTR/L 10/25/2023, 4:00 PM

## 2023-10-25 NOTE — Progress Notes (Signed)
 Occupational Therapy Session Note  Patient Details  Name: Christian Bailey MRN: [id number] Date of Birth: 04/22/1941  Today's Date: 10/25/2023 OT Individual Time: 7829-5621 OT Individual Time Calculation (min): 29 min    Short Term Goals: Week 1:  OT Short Term Goal 1 (Week 1): Pt will complete self feeding with Ae as necessary at Min A OT Short Term Goal 2 (Week 1): Pt will complete sit to stand with LRAD at Mod A in preparation for ADLs OT Short Term Goal 3 (Week 1): Pt will complete LB dressing at Max A with AE as necessary  Skilled Therapeutic Interventions/Progress Updates:  Pt greeted supine in bed, pt agreeable to OT intervention.      Transfers/bed mobility/functional mobility:  Pt completed supine>sit with total A. Pt able to sit EOB with CGA- MINA for safety. Utilized stedy for OOB transfer with pt able to stand to stedy with MAX A +2. Dependent transfer into TIS with stedy.    ADLs:  Grooming: pt completed seated oral care with set- up assist with brush in pts L hand.   LB dressing: donned pants from bed level with total A with pt able to roll R<>with MAX A +2.  Footwear: donned socks with total A from bed level.    Ended session with pt seated in TIS with all needs within reach and safety belt alarm activated.                    Therapy Documentation Precautions:  Precautions Precautions: Fall, Cervical Precaution/Restrictions Comments: decreased recall of precautions, not able to verbalize any when prompted Required Braces or Orthoses: Cervical Brace Cervical Brace: Soft collar Restrictions Weight Bearing Restrictions Per Provider Order: No  Pain: no pain reported during session     Therapy/Group: Individual Therapy  Mollie Anger [facility] 10/25/2023, 12:22 PM

## 2023-10-26 DIAGNOSIS — G8250 Quadriplegia, unspecified: Secondary | ICD-10-CM | POA: Diagnosis not present

## 2023-10-26 LAB — CBC WITH DIFFERENTIAL/PLATELET
Abs Immature Granulocytes: 0.06 10*3/uL (ref 0.00–0.07)
Basophils Absolute: 0.1 10*3/uL (ref 0.0–0.1)
Basophils Relative: 1 %
Eosinophils Absolute: 0.4 10*3/uL (ref 0.0–0.5)
Eosinophils Relative: 4 %
HCT: 28.1 % — ABNORMAL LOW (ref 39.0–52.0)
Hemoglobin: 9.2 g/dL — ABNORMAL LOW (ref 13.0–17.0)
Immature Granulocytes: 1 %
Lymphocytes Relative: 14 %
Lymphs Abs: 1.1 10*3/uL (ref 0.7–4.0)
MCH: 29 pg (ref 26.0–34.0)
MCHC: 32.7 g/dL (ref 30.0–36.0)
MCV: 88.6 fL (ref 80.0–100.0)
Monocytes Absolute: 0.6 10*3/uL (ref 0.1–1.0)
Monocytes Relative: 7 %
Neutro Abs: 6 10*3/uL (ref 1.7–7.7)
Neutrophils Relative %: 73 %
Platelets: 383 10*3/uL (ref 150–400)
RBC: 3.17 MIL/uL — ABNORMAL LOW (ref 4.22–5.81)
RDW: 14.5 % (ref 11.5–15.5)
WBC: 8.2 10*3/uL (ref 4.0–10.5)
nRBC: 0 % (ref 0.0–0.2)

## 2023-10-26 LAB — BASIC METABOLIC PANEL WITH GFR
Anion gap: 8 (ref 5–15)
BUN: 70 mg/dL — ABNORMAL HIGH (ref 8–23)
CO2: 27 mmol/L (ref 22–32)
Calcium: 8.7 mg/dL — ABNORMAL LOW (ref 8.9–10.3)
Chloride: 102 mmol/L (ref 98–111)
Creatinine, Ser: 1.59 mg/dL — ABNORMAL HIGH (ref 0.61–1.24)
GFR, Estimated: 43 mL/min — ABNORMAL LOW (ref >60)
Glucose, Bld: 168 mg/dL — ABNORMAL HIGH (ref 70–99)
Potassium: 4.3 mmol/L (ref 3.5–5.1)
Sodium: 137 mmol/L (ref 135–145)

## 2023-10-26 LAB — GLUCOSE, CAPILLARY
Glucose-Capillary: 153 mg/dL — ABNORMAL HIGH (ref 70–99)
Glucose-Capillary: 160 mg/dL — ABNORMAL HIGH (ref 70–99)
Glucose-Capillary: 242 mg/dL — ABNORMAL HIGH (ref 70–99)
Glucose-Capillary: 196 mg/dL — ABNORMAL HIGH (ref 70–99)

## 2023-10-26 NOTE — Progress Notes (Signed)
 Physical Therapy Session Note  Patient Details  Name: Christian Bailey MRN: [id number] Date of Birth: [birth year]  Today's Date: 10/27/2023 PT Individual Time:  -      Short Term Goals: Week 1:  PT Short Term Goal 1 (Week 1): Pt will initiate gait training PT Short Term Goal 1 - Progress (Week 1): Met PT Short Term Goal 2 (Week 1): Pt will perform STS with mod a consistently PT Short Term Goal 2 - Progress (Week 1): Not met PT Short Term Goal 3 (Week 1): Pt will perform bed mobility with mod a PT Short Term Goal 3 - Progress (Week 1): Not met Week 2:  PT Short Term Goal 1 (Week 2): Pt will be able to perform bed mobility with mod assist PT Short Term Goal 2 (Week 2): Pt will be able to perform bed <> w/c transfer with mod assist PT Short Term Goal 3 (Week 2): Pt will be able to tolerate standing for weightbearing x 5 min PT Short Term Goal 4 (Week 2): Pt will initiate PWC mobility  Skilled Therapeutic Interventions/Progress Updates:  Patient supine in bed on entrance to room. Patient alert and agreeable to PT session. RN in room and providing afternoon medications. Pt relates seeing flowers that are changing types on the walls in room. Attempted to relate to pt that there are no flowers or pictures of flowers on walls, but he continues to see flowers.   Patient with no pain complaint at start of session. However is fatigued and relates desire to remain in bed - but open to bed level activities.   Therapeutic Exercise/ NMR: Pt performed the following exercises with vc/ tc for proper technique in order to maximize strengthening.  PROM holds into Bil ankle DF for x 5 alternating AAROM for ankle mobility in CW/ CCW circles AROM SL abd/ add - RLE stronger than LLE and can perform against light resistance AAROM heel slides followed by AROM/ resisted push into extension  AAROM SLR into PROM 30sec holds for hamstring stretching AAROM into activation for glute sets and minimal bridging. (Pt  tends to move BLE into extension instead of holding in flexion and pressing heels into bed).  All performed 2x10 except for SLR and bridging performed 1x10. Good improvement in muscle group activation with each exercise.   NMR performed for improvements in motor control and coordination, sequencing, and self confidence/ efficacy in performing all aspects of mobility at highest level of independence.   Pt attempts to assist with moving toward [facility] with positioning into hooklying however pt unable to press heels into bed surface and instead pushes BLE into extension. Requires MaxA +2 for positioning to [facility].   Patient supine in bed at end of session with brakes locked, bed alarm set, and all needs within reach. Pt oriented to positioning of call bell, water, Miralax , and spit cup.    Therapy Documentation Precautions:  Precautions Precautions: Fall, Cervical Precaution/Restrictions Comments: decreased recall of precautions, not able to verbalize any when prompted Required Braces or Orthoses: Cervical Brace Cervical Brace: Soft collar Restrictions Weight Bearing Restrictions Per Provider Order: No  Pain:  No pain related by pt this session.    Therapy/Group: Individual Therapy  Donne Gage PT, DPT, CSRS 10/27/2023, 6:21 AM

## 2023-10-26 NOTE — Progress Notes (Signed)
 Physical Therapy Session Note  Patient Details  Name: Christian Bailey MRN: [id number] Date of Birth: Mar 15, 1941  Today's Date: 10/26/2023 PT Individual Time: 1135-1200 PT Individual Time Calculation (min): 25 min   Short Term Goals: Week 2:  PT Short Term Goal 1 (Week 2): Pt will be able to perform bed mobility with mod assist PT Short Term Goal 2 (Week 2): Pt will be able to perform bed <> w/c transfer with mod assist PT Short Term Goal 3 (Week 2): Pt will be able to tolerate standing for weightbearing x 5 min PT Short Term Goal 4 (Week 2): Pt will initiate PWC mobility  Skilled Therapeutic Interventions/Progress Updates:  pt received in TIS and agreeable to therapy. Reports continued shoulder pain with activity, premedicated. Rest and positioning provided as needed.  Session focused on standing frame training for spasticity management and neuromuscular recovery. Pt able to stand for 3-5 minutes at a stretch, mostly limited by R shoulder pain. Attempted to cue for scapular retraction in standing, but pt states"I don't know how to do that" in response to attempts to cue in various ways both verbally and with tactile cues. Pt demoes poor awareness in that he will state his shoulder hurts, which is then relieved by simply changing position, but not initiate the change without prompting. Also provided education on huffing for airway clearance, pt able to return demo. Pt returned to room after session, remained in TIS with needs in reach and NT present.   Therapy Documentation Precautions:  Precautions Precautions: Fall, Cervical Precaution/Restrictions Comments: decreased recall of precautions, not able to verbalize any when prompted Required Braces or Orthoses: Cervical Brace Cervical Brace: Soft collar Restrictions Weight Bearing Restrictions Per Provider Order: No General:       Therapy/Group: Individual Therapy  [name] 10/26/2023, 12:22 PM

## 2023-10-26 NOTE — Progress Notes (Signed)
 Patient ID: Christian Bailey, male   DOB: Nov 02, 1940, 83 y.o.   MRN: [id number]  Met with pt and wife who is present to discuss team conference goals of min-mod level and ELOS 3 weeks from now. Pt seems much better today and not confused like the past few days. He is answering appropriately questions and conversations. Discussed if wife will be able to provide the care he will require at discharge and will discuss at [facility] on Thursday at 10;00. She will talk with their local son-Tim to see if can be here or can be on speaker phone during the meeting. Their son-Daniel from [place] was on speaker when spoke with both this afternoon. Wife was doing a lot prior to admission for pt but was not really lifting him like she may need to do now. Have let team know of family conference Thursday

## 2023-10-26 NOTE — Progress Notes (Signed)
 Occupational Therapy Session Note  Patient Details  Name: Christian Bailey MRN: [id number] Date of Birth: 05-13-1941  Today's Date: 10/26/2023 OT Individual Time: 1300-1400 OT Individual Time Calculation (min): 60 min    Short Term Goals: Week 1:  OT Short Term Goal 1 (Week 1): Pt will complete self feeding with Ae as necessary at Min A OT Short Term Goal 2 (Week 1): Pt will complete sit to stand with LRAD at Mod A in preparation for ADLs OT Short Term Goal 3 (Week 1): Pt will complete LB dressing at Max A with AE as necessary Week 2:     Skilled Therapeutic Interventions/Progress Updates:    1:1 Pt received in the w/c. Taken to the gym. Focus on slide board transfer with maintaining forward weight shift with lateral weight shift. Pt still required total A +1. On EOM with feet on 4 inch step focus on leaning forward to reach ankles, return to leaning on thighs to returning to upright sitting. PT able to reach down to ankles but not shoe and able to return to sitting position with supervision. Without step focus on right LE activation to kick ball- pt able to bend knee back and kick ball forward (unable full extend knee out due to tone).   Pt reports feeling light headed BP taken and at first in sitting 73/54; retaken 90/56 and then 20 min lateral taken and was 95/42 but activities didn't change. However pt did become more confused and repeating himself as session continued. Pt asked if we were going to "work on leg rests" when removing them.  Pt transferred back into bed to change incontinent BM brief. PT able to help with weight shift with counting to slide board back into bed with total A +1. Pt required total A to change with rolling with max instructional cues and mod A. Pt then asked if we were back in his room at the end of session. RN aware of vitals and confusion.   Vitals at end of session are below.   Therapy Documentation Precautions:  Precautions Precautions: Fall,  Cervical Precaution/Restrictions Comments: decreased recall of precautions, not able to verbalize any when prompted Required Braces or Orthoses: Cervical Brace Cervical Brace: Soft collar Restrictions Weight Bearing Restrictions Per Provider Order: No    Vital Signs: Therapy Vitals Temp: 98.5 F (36.9 C) Pulse Rate: 86 Resp: 16 BP: (!) 112/56 Patient Position (if appropriate): Lying Oxygen Therapy SpO2: 97 % O2 Device: Room Air Pain:  Some mild back pain- allow for rest breaks throughout session    Therapy/Group: Individual Therapy  Henrene Locust [facility] 10/26/2023, 3:39 PM

## 2023-10-26 NOTE — Progress Notes (Signed)
 Physical Therapy Weekly Progress Note  Patient Details  Name: Christian Bailey MRN: [id number] Date of Birth: Dec 12, 1940  Beginning of progress report period: October 19, 2023 End of progress report period: October 26, 2023    Patient has met 1 of 3 short term goals.  Pt has made limited functional progress overall since admission. Pt continues to require max to +2 assist (with and without lift equipment) to perform transfers and standing activities. Pt's spasticity has a significant impact on progressing functional mobility as well as overall UE limitations due to h/o shoulder injury. Pt also has experienced some cognitive changes/questionable delirium this past week that medical team is monitoring -lethargy has had some impact on therapy. Will need to determine what level family can provide at d/c to determine if home vs SNF is more appropriate. Goals have been downgraded to overall min/mod assist w/c level and anticipate pt will require PWC due to shoulder limitations (unable to functionally propel).   Patient continues to demonstrate the following deficits muscle weakness and muscle joint tightness, decreased cardiorespiratoy endurance, abnormal tone, unbalanced muscle activation, and decreased coordination, decreased visual acuity, decreased midline orientation, decreased memory, and decreased sitting balance, decreased standing balance, decreased postural control, and decreased balance strategies and therefore will continue to benefit from skilled PT intervention to increase functional independence with mobility.  Patient not progressing toward long term goals.  See goal revision..  Plan of care revisions: downgraded goals to min to mod assist overall w/c level (power mobility) for transfers and discharged gait/stair goals at this time due to lack of progress.  PT Short Term Goals Week 1:  PT Short Term Goal 1 (Week 1): Pt will initiate gait training PT Short Term Goal 1 - Progress (Week 1): Met PT  Short Term Goal 2 (Week 1): Pt will perform STS with mod a consistently PT Short Term Goal 2 - Progress (Week 1): Not met PT Short Term Goal 3 (Week 1): Pt will perform bed mobility with mod a PT Short Term Goal 3 - Progress (Week 1): Not met Week 2:  PT Short Term Goal 1 (Week 2): Pt will be able to perform bed mobility with mod assist PT Short Term Goal 2 (Week 2): Pt will be able to perform bed <> w/c transfer with mod assist PT Short Term Goal 3 (Week 2): Pt will be able to tolerate standing for weightbearing x 5 min PT Short Term Goal 4 (Week 2): Pt will initiate PWC mobility  Skilled Therapeutic Interventions/Progress Updates:  Ambulation/gait training;Cognitive remediation/compensation;Discharge planning;DME/adaptive equipment instruction;Functional mobility training;Pain management;Psychosocial support;Splinting/orthotics;Therapeutic Activities;UE/LE Strength taining/ROM;Visual/perceptual remediation/compensation;Wheelchair propulsion/positioning;UE/LE Coordination activities;Therapeutic Exercise;Stair training;Patient/family education;Skin care/wound management;Balance/vestibular training;Community reintegration;Disease management/prevention;Neuromuscular re-education;Functional electrical stimulation   Therapy Documentation Precautions:  Precautions Precautions: Fall, Cervical Precaution/Restrictions Comments: decreased recall of precautions, not able to verbalize any when prompted Required Braces or Orthoses: Cervical Brace Cervical Brace: Soft collar Restrictions Weight Bearing Restrictions Per Provider Order: No  Faye Hoops, PT, DPT, CBIS  10/26/2023, 9:52 AM

## 2023-10-26 NOTE — Progress Notes (Signed)
 Patient ID: Christian Bailey, male   DOB: 1940/09/15, 83 y.o.   MRN: [id number]  Spoke with wife via telephone to discuss team conference today and will discuss goals today in conference. Her concern is his confusion and how deep he is sleeping. He did not do this at home and informed worker he would get up some days at 7:30 or 9:30. Discussed family conference and she was interested will call after team conference today.
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
## 2023-10-26 NOTE — Plan of Care (Signed)
  Problem: RH Balance Goal: LTG Patient will maintain dynamic sitting balance (PT) Description: LTG:  Patient will maintain dynamic sitting balance with assistance during mobility activities (PT) Flowsheets (Taken 10/26/2023 0941) LTG: Pt will maintain dynamic sitting balance during mobility activities with:: (downgraded due to safety) Supervision/Verbal cueing Note: Downgraded due to safety Goal: LTG Patient will maintain dynamic standing balance (PT) Description: LTG:  Patient will maintain dynamic standing balance with assistance during mobility activities (PT) Flowsheets (Taken 10/26/2023 0942) LTG: Pt will maintain dynamic standing balance during mobility activities with:: (downgraded due to lack of progress) Moderate Assistance - Patient 50 - 74% Note: downgraded due to lack of progress    Problem: Sit to Stand Goal: LTG:  Patient will perform sit to stand with assistance level (PT) Description: LTG:  Patient will perform sit to stand with assistance level (PT) Flowsheets (Taken 10/26/2023 0942) LTG: PT will perform sit to stand in preparation for functional mobility with assistance level: (downgraded due to lack of progress) Moderate Assistance - Patient 50 - 74% Note: downgraded due to lack of progress    Problem: RH Bed Mobility Goal: LTG Patient will perform bed mobility with assist (PT) Description: LTG: Patient will perform bed mobility with assistance, with/without cues (PT). Flowsheets (Taken 10/26/2023 [phone]) LTG: Pt will perform bed mobility with assistance level of: (downgraded due to lack of progress) Minimal Assistance - Patient > 75% Note: downgraded due to lack of progress    Problem: RH Bed to Chair Transfers Goal: LTG Patient will perform bed/chair transfers w/assist (PT) Description: LTG: Patient will perform bed to chair transfers with assistance (PT). Flowsheets (Taken 10/26/2023 [phone]) LTG: Pt will perform Bed to Chair Transfers with assistance level: (downgraded due  to lack of progress) Minimal Assistance - Patient > 75% Note: downgraded due to lack of progress    Problem: RH Car Transfers Goal: LTG Patient will perform car transfers with assist (PT) Description: LTG: Patient will perform car transfers with assistance (PT). Flowsheets (Taken 10/26/2023 [phone]) LTG: Pt will perform car transfers with assist:: (downgraded due to lack of progress) Moderate Assistance - Patient 50 - 74% Note: downgraded due to lack of progress    Problem: RH Ambulation Goal: LTG Patient will ambulate in controlled environment (PT) Description: LTG: Patient will ambulate in a controlled environment, # of feet with assistance (PT). Flowsheets (Taken 10/26/2023 [phone]) LTG: Pt will ambulate in controlled environ  assist needed:: (D/c due to not functional ambulator) -- Note: downgraded due to lack of progress    Problem: RH Stairs Goal: LTG Patient will ambulate up and down stairs w/assist (PT) Description: LTG: Patient will ambulate up and down # of stairs with assistance (PT) Flowsheets (Taken 10/26/2023 0942) LTG: Pt will ambulate up/down stairs assist needed:: (D/c due to lack of progress) -- Note: D/C due to lack of progress

## 2023-10-27 DIAGNOSIS — F4323 Adjustment disorder with mixed anxiety and depressed mood: Secondary | ICD-10-CM

## 2023-10-27 DIAGNOSIS — G959 Disease of spinal cord, unspecified: Secondary | ICD-10-CM | POA: Diagnosis not present

## 2023-10-27 DIAGNOSIS — G8250 Quadriplegia, unspecified: Secondary | ICD-10-CM | POA: Diagnosis not present

## 2023-10-27 LAB — GLUCOSE, CAPILLARY
Glucose-Capillary: 147 mg/dL — ABNORMAL HIGH (ref 70–99)
Glucose-Capillary: 164 mg/dL — ABNORMAL HIGH (ref 70–99)
Glucose-Capillary: 140 mg/dL — ABNORMAL HIGH (ref 70–99)
Glucose-Capillary: 215 mg/dL — ABNORMAL HIGH (ref 70–99)

## 2023-10-27 MED ORDER — METOPROLOL SUCCINATE ER 25 MG PO TB24
12.5000 mg | ORAL_TABLET | Freq: Every day | ORAL | Status: DC
  Administered 2023-10-27 – 2023-11-24 (×28): 12.5 mg via ORAL
  Filled 2023-10-27 (×29): qty 1

## 2023-10-27 MED ORDER — LINAGLIPTIN 5 MG PO TABS
5.0000 mg | ORAL_TABLET | Freq: Every day | ORAL | Status: DC
  Administered 2023-10-27 – 2023-11-24 (×29): 5 mg via ORAL
  Filled 2023-10-27 (×29): qty 1

## 2023-10-27 NOTE — Progress Notes (Signed)
 Physical Therapy Session Note  Patient Details  Name: Christian Bailey MRN: [id number] Date of Birth: 22-Sep-1940  Today's Date: 10/27/2023 PT Individual Time: 1306-1350 PT Individual Time Calculation (min): 44 min   Short Term Goals: Week 2:  PT Short Term Goal 1 (Week 2): Pt will be able to perform bed mobility with mod assist PT Short Term Goal 2 (Week 2): Pt will be able to perform bed <> w/c transfer with mod assist PT Short Term Goal 3 (Week 2): Pt will be able to tolerate standing for weightbearing x 5 min PT Short Term Goal 4 (Week 2): Pt will initiate PWC mobility  Skilled Therapeutic Interventions/Progress Updates: Pt presented in TIS c/o 8/10 pain in BLE. Per pt feels like "a tourniquet" on upper legs, R>L. Pt noted to have knee high TED hose with ace bandages up to thigh. PTA performed gentle ROM with pt c/o increased pain with knee extension initially but improved with range. BP checked 100/53 (66) HR 67 with no c/o dizziness or "wooziness" when positioned upright. Pt transported to day room for Kinetron however occupied therefore participated in LAQ with PTA providing assist to compelte range and AA hip flexion. Pt also performed knee flexion/extension with foot placed on yellow ball for increased ms recruitment.  Pt transported back to room and set up on Slide board total A. Pt completed Slide board transfer total A, with PTA providing facilitation for increased anterior weight shifting. Pt required modA for sit to supine only for BLE management. PTA removed ace bandages and TED hose total A with much relief to pt. PTA placed pillow under pt's legs to allow heels to float. Pt left with call bell within reach and needs met.      Therapy Documentation Precautions:  Precautions Precautions: Fall, Cervical Precaution/Restrictions Comments: decreased recall of precautions, not able to verbalize any when prompted Required Braces or Orthoses: Cervical Brace Cervical Brace: Soft  collar Restrictions Weight Bearing Restrictions Per Provider Order: No General:   Vital Signs: Therapy Vitals Temp: 98.5 F (36.9 C) Pulse Rate: 84 Resp: 16 BP: (!) 143/76 Patient Position (if appropriate): Lying Oxygen Therapy SpO2: 97 % O2 Device: Room Air Pain:      Therapy/Group: Individual Therapy  Lilleigh Hechavarria 10/27/2023, 4:18 PM

## 2023-10-27 NOTE — Progress Notes (Signed)
 Occupational Therapy Session Note  Patient Details  Name: Christian Bailey MRN: [id number] Date of Birth: [birth year]  Today's Date: 10/27/2023 OT Individual Time: 4782-9562 OT Individual Time Calculation (min): 70 min    Short Term Goals: Week 1:  OT Short Term Goal 1 (Week 1): Pt will complete self feeding with Ae as necessary at Min A OT Short Term Goal 2 (Week 1): Pt will complete sit to stand with LRAD at Mod A in preparation for ADLs OT Short Term Goal 3 (Week 1): Pt will complete LB dressing at Max A with AE as necessary  Skilled Therapeutic Interventions/Progress Updates:    Pt resting in bed upon arrival. Pt reported he felt like he had BM. Dependent for hygiene and changing brief. Tot  A to initiate rolling R/L but able to grasp rails on bed to facilitate hygiene. OTA donned thigh high Ted hose and ace wraps on pt's BLE for OH mgmt. Rolling in bed to facilitate donning pants at bed level. Focus on bed mobility to increase pt's independence with BADLs and facilitate by caregivers. Pt remained in bed with all needs within reach. Bed alarm activated.   Therapy Documentation Precautions:  Precautions Precautions: Fall, Cervical Precaution/Restrictions Comments: decreased recall of precautions, not able to verbalize any when prompted Required Braces or Orthoses: Cervical Brace Cervical Brace: Soft collar Restrictions Weight Bearing Restrictions Per Provider Order: No   Pain:  Pt c/o 3/10 generalized pain; repostioned in bed   Therapy/Group: Individual Therapy  [name] 10/27/2023, 10:41 AM

## 2023-10-27 NOTE — Progress Notes (Signed)
 PROGRESS NOTE   Subjective/Complaints:  Pt reports he's frustrated cannot do as much as normal since SCI.  Sore in legs- alternates upper and lower legs- pain ~4/10 this AM.  LBM overnight- feels like could go again.   Feels like spitting up saliva more- in last "few weeks to months".  Per therapy, pt's BP dropped to 79/50's with therapy sitting, not standing yesterday.    ROS:   Pt denies SOB, abd pain, CP, N/V/C/D, and vision changes   Objective:   DG Chest 2 View Result Date: 10/25/2023 CLINICAL DATA:  Altered mental status EXAM: CHEST - 2 VIEW COMPARISON:  None Available. FINDINGS: Lungs are well expanded, symmetric, and clear. No pneumothorax or pleural effusion. Cardiac size within normal limits. Pulmonary vascularity is normal. Osseous structures are age-appropriate. No acute bone abnormality. Right reverse shoulder arthroplasty noted. IMPRESSION: No active cardiopulmonary disease. Electronically Signed   By: Worthy Heads M.D.   On: 10/25/2023 22:06    Recent Labs    10/25/23 0509 10/26/23 0510  WBC 7.9 8.2  HGB 9.5* 9.2*  HCT 28.9* 28.1*  PLT 437* 383    Recent Labs    10/25/23 0509 10/26/23 0510  NA 136 137  K 4.1 4.3  CL 98 102  CO2 27 27  GLUCOSE 197* 168*  BUN 72* 70*  CREATININE 1.63* 1.59*  CALCIUM  9.0 8.7*     Intake/Output Summary (Last 24 hours) at 10/27/2023 0753 Last data filed at 10/26/2023 1939 Gross per 24 hour  Intake 800 ml  Output 350 ml  Net 450 ml        Physical Exam: Vital Signs Blood pressure (!) 119/58, pulse 92, temperature 97.8 F (36.6 C), temperature source Oral, resp. rate 16, height 5\' 4"  (1.626 m), weight 60.6 kg, SpO2 96%.      General: awake, alert, appropriate, asleep, but woke easily; NAD HENT: conjugate gaze; oropharynx moist CV: regular rate and rhythm- rate in 90's; no JVD Pulmonary: somewhat coarse at bases B/L-  GI: soft, NT, ND, (+)BS-  normoactive Psychiatric: appropriate- interactive-  Neurological: Ox3! Extremities_ no LE edema B/L  GU- foley- light amber urine PRIOR EXAMS: Spasticity- MAS of 1+ to 2 in Ue's this Am and 3 in RLE- MAS worse this AM- 2-3 throughout Neurologic: Cranial nerves II through XII intact, motor strength is 4/5 in bilateral  bicep, tricep, grip,and finger ext and hand intrinsics, 3- RIght delt and 4/5 left delt, 3- bilateral  hip flexor, 3- RIght and 4- left knee extensors, 4/5 bilateral ankle dorsiflexor and plantar flexor Sensory exam normal sensation to light touch and proprioception in bilateral upper and lower extremities Tone- 3+ a bilateral biceps triceps and BR, + Hoffman's bilateral 2/5 RIght 3+ left knee Also MAS 3 tone in RIght Knee flexors and extensors, MAS 1 at the left knee Clonus at bilateral ankles  Musculoskeletal: Full range of motion in all 4 extremities. No joint swelling   Assessment/Plan: 1. Functional deficits which require 3+ hours per day of interdisciplinary therapy in a comprehensive inpatient rehab setting. Physiatrist is providing close team supervision and 24 hour management of active medical problems listed below. Physiatrist and rehab team continue to  assess barriers to discharge/monitor patient progress toward functional and medical goals  Care Tool:  Bathing              Bathing assist Assist Level: Dependent - Patient 0%     Upper Body Dressing/Undressing Upper body dressing        Upper body assist Assist Level: Moderate Assistance - Patient 50 - 74%    Lower Body Dressing/Undressing Lower body dressing            Lower body assist Assist for lower body dressing: Dependent - Patient 0%     Toileting Toileting    Toileting assist Assist for toileting: Dependent - Patient 0%     Transfers Chair/bed transfer  Transfers assist     Chair/bed transfer assist level: Dependent - mechanical lift      Locomotion Ambulation   Ambulation assist   Ambulation activity did not occur: Safety/medical concerns  Assist level: 2 helpers   Max distance: 5'   Walk 10 feet activity   Assist  Walk 10 feet activity did not occur: Safety/medical concerns        Walk 50 feet activity   Assist Walk 50 feet with 2 turns activity did not occur: Safety/medical concerns         Walk 150 feet activity   Assist Walk 150 feet activity did not occur: Safety/medical concerns         Walk 10 feet on uneven surface  activity   Assist Walk 10 feet on uneven surfaces activity did not occur: Safety/medical concerns         Wheelchair     Assist Is the patient using a wheelchair?: Yes Type of Wheelchair: Manual    Wheelchair assist level: Dependent - Patient 0% Max wheelchair distance: 150    Wheelchair 50 feet with 2 turns activity    Assist        Assist Level: Dependent - Patient 0%   Wheelchair 150 feet activity     Assist      Assist Level: Dependent - Patient 0%   Blood pressure (!) 119/58, pulse 92, temperature 97.8 F (36.6 C), temperature source Oral, resp. rate 16, height 5\' 4"  (1.626 m), weight 60.6 kg, SpO2 96%.  Medical Problem List and Plan: 1. 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  Antithrombotics: -DVT/anticoagulation:  Mechanical: Sequential compression devices, below knee Bilateral lower extremities 4/23- will see if Dr Ellery Guthrie will allow us  to start Lovenox - he said yes, so will start. 30mg  daily.             -antiplatelet therapy: N/A 3. Pain Management: tylenol  prn. Voltaren  QID, lyrica  50mg  BID, flexeril  PRN -10/24/23 leg muscle  soreness likely from PT/OT this week; will start muscle rub cream 4/28- still c/o muscle soreness in legs- con't pain regimen as needed 4/30- still sore, but don't want Flexeril  since likely caused confusion- con't tylenol  prn- can use muscle rub which is ordered 4. Mood/Behavior/Sleep: LCSW to follow up for evaluation and support when appropriate --Delirium precautions--His mental status has cleared but will monitor  for worsening with transfer to new unit .pt has not required prn meds for agitation in several days  Monitor sleep wake cycle             -antipsychotic agents: Change haldol  q 6 hrs prn to Seroquel  12.5mg  tid prn  -Restoril  7.5mg  nightly, cymbalta  20mg  daily 4/23-4/25 no delirium so far 4/28- having some delirium this AM? Not severe, but will monitor since has had in recent past 4/29- still confused this AM- but per nursing , was less confused overnight after Restoril  stopped- still pleasantly confused this AM when first woke up- didn't open eyes.  4/30- Aox3 this AM 5. Neuropsych/cognition: This patient is usually  capable of making decisions on his own behalf. 6. Skin/Wound Care: Routine pressure relief measures. 7. Fluids/Electrolytes/Nutrition:  Monitor I/O. Routine labs. Continue vitamins/supplements. 8. 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  -10/23/23 foley replaced yesterday by Dr. Willye Harvey, per note, leave in place for 7-10d, Consider resuming In-N-Out catheterization for management of his neurogenic bladder at that time. Recommend using a coud catheter for intermittent catheterization (when resumed).  4/28- will leave foley for 7-10 days 10.  Diabetes- A1c 8.0-  with peripheral neuropathy by hx only on Jardiance at home, use SSI in the hospital  -10/23/23 CBGs variable but likely d/t steroids; cont SSI for now  4/29- CBG's very labile in last 24 hours 99-383- - it concerns me to start Jardiance since of Foley and risk of UTI's- also for starting Tradjenta since BG was 99 last night- don't want to drop BG too much  4/30- Will add Tradjenta since BG's still running 150-242- will monitor closely for CBG hypoglycemia-  CBG (last 3)  Recent Labs    10/26/23 1620 10/26/23 2105 10/27/23 0553  GLUCAP 242* 196* 147*     11.  Spasticity affecting LEs> UE and R>L , aggressive ROM  with PT, consider oral meds  4/22- starting baclofen  5 mg TID- with his Cr 1.50- has been running 1.68 to 2.30  4/24- will not increase baclofen  due to Renal issues 12.  PAD s/p SFA and Iliac stenting, continue metoprolol  50mg  daily  13. CKD3b? With AKI -4/22- Pt's Cr down to 1.50 from 2.29 2 days ago and 1.68 yesterday- will monitor 2x/week to see if will reduce further or be labile.   4/24- Cr 1.84- and BUN stable at 55- is drinking per pt  4/28- Cr 1.63- doing better  4/29- Cr 1.59 after 12+ hours of IVF's- and BUN very slightly down to 70 from 72-  14. Questionable neurogenic bowel: continue miralax  32g daily, senokot s 2 tabs qAM 4/24- required suppository 2 days ago however had BM on bedpan yesterday- thinks is mild.   4/25-  smear that was incontinent last night -10/23/23 LBM last night per pt was very hard still,  but documented as loose-- doesn't like miralax  (on 32g daily)-- will add colace 200mg  daily, see if this helps, if continued loose stools then would stop the miralax  first. Can mix miralax  with half juice/half water.  -10/24/23 looser stools yesterday, will stop miralax , leave colace-- pt prefers this  4/28- LBM 2 days ago  4/29- LBM small liquid stool 4/28- and 1 medium stool as well-   4/30- LBM overnight 15. HLD: crestor  20mg  nightly 16. Delirium/Confusion  4/29- yesterday given IVF's for this- checked U/A and Cx- which showed mod leuks, however rate bacteria and very little WBC's 6-10- which is not a UTI in most cases- will d/w pharmacy-   -pt still pleasantly confused this AM- but didn't even open eyes, was so sleepy, so hard to tease out what is due to sleepiness and what is actual delirium- per nurse, much better last night-   4/30- pt Ox3 this AM- very with it- likely flexeril  based on chart review- given for pain since on no pain meds- will keep off Flexeril  and monitor Sx's  -also d/w nursing to move pt's room up to 3 since was having confusion 17. Hx of HTN with severe hypotension  4/30- pt's BP dropped to 79 systolic yesterday when sitting with OT- not clear if had TEDS/ACE wraps- will reduce Metoprolol  XL to 12.5 mg daily- from 50 mg daily and see if that helps- don't want ot add midodrine quite yet.   18. Atelectasis  4/30- sounds coarse- asked pt to use ICS regularly- is at bedside- also described "spitting things up"_ had "spit cup"- at bedside-  had barely anything in it.    I spent a total of 45   minutes on total care today- >50% coordination of care- due to  D/w nursing x3- prolonged time with pt- also d/w therapy about low BP-    LOS: 9 days A FACE TO FACE EVALUATION WAS PERFORMED  Edana Aguado 10/27/2023, 7:53 AM

## 2023-10-27 NOTE — Plan of Care (Signed)
 Problem: Consults Goal: RH GENERAL PATIENT EDUCATION Description: See Patient Education module for education specifics. 10/27/2023 1904 by Annalee Barren, RN Outcome: Progressing 10/27/2023 1904 by Annalee Barren, RN Outcome: Progressing   Problem: RH BOWEL ELIMINATION Goal: RH STG MANAGE BOWEL WITH ASSISTANCE Description: STG Manage Bowel with mod I  Assistance. 10/27/2023 1904 by Annalee Barren, RN Outcome: Progressing 10/27/2023 1904 by Annalee Barren, RN Outcome: Progressing Goal: RH STG MANAGE BOWEL W/MEDICATION W/ASSISTANCE Description: STG Manage Bowel with Medication with mod I Assistance. 10/27/2023 1904 by Annalee Barren, RN Outcome: Progressing 10/27/2023 1904 by Annalee Barren, RN Outcome: Progressing   Problem: RH BLADDER ELIMINATION Goal: RH STG MANAGE BLADDER WITH ASSISTANCE Description: STG Manage Bladder With toileting / mod I Assistance 10/27/2023 1904 by Annalee Barren, RN Outcome: Progressing 10/27/2023 1904 by Annalee Barren, RN Outcome: Progressing Goal: RH STG MANAGE BLADDER WITH MEDICATION WITH ASSISTANCE Description: STG Manage Bladder With Medication With mod I Assistance. 10/27/2023 1904 by Annalee Barren, RN Outcome: Progressing 10/27/2023 1904 by Annalee Barren, RN Outcome: Progressing   Problem: RH SAFETY Goal: RH STG ADHERE TO SAFETY PRECAUTIONS W/ASSISTANCE/DEVICE Description: STG Adhere to Safety Precautions With cues Assistance/Device. 10/27/2023 1904 by Annalee Barren, RN Outcome: Progressing 10/27/2023 1904 by Annalee Barren, RN Outcome: Progressing   Problem: RH PAIN MANAGEMENT Goal: RH STG PAIN MANAGED AT OR BELOW PT'S PAIN GOAL Description: < 4 with prns 10/27/2023 1904 by Annalee Barren, RN Outcome: Progressing 10/27/2023 1904 by Annalee Barren, RN Outcome: Progressing   Problem: RH KNOWLEDGE DEFICIT GENERAL Goal: RH STG INCREASE KNOWLEDGE OF SELF CARE  AFTER HOSPITALIZATION Description: Patient and wife will be able to manage care at discharge using educational resources for medication, skin care,dietary modification and fall prevention independently 10/27/2023 1904 by Annalee Barren, RN Outcome: Progressing 10/27/2023 1904 by Annalee Barren, RN Outcome: Progressing   Problem: Education: Goal: Ability to describe self-care measures that may prevent or decrease complications (Diabetes Survival Skills Education) will improve 10/27/2023 1904 by Annalee Barren, RN Outcome: Progressing 10/27/2023 1904 by Annalee Barren, RN Outcome: Progressing Goal: Individualized Educational Video(s) 10/27/2023 1904 by Annalee Barren, RN Outcome: Progressing 10/27/2023 1904 by Annalee Barren, RN Outcome: Progressing   Problem: Coping: Goal: Ability to adjust to condition or change in health will improve 10/27/2023 1904 by Annalee Barren, RN Outcome: Progressing 10/27/2023 1904 by Annalee Barren, RN Outcome: Progressing   Problem: Fluid Volume: Goal: Ability to maintain a balanced intake and output will improve 10/27/2023 1904 by Annalee Barren, RN Outcome: Progressing 10/27/2023 1904 by Annalee Barren, RN Outcome: Progressing   Problem: Health Behavior/Discharge Planning: Goal: Ability to identify and utilize available resources and services will improve 10/27/2023 1904 by Annalee Barren, RN Outcome: Progressing 10/27/2023 1904 by Annalee Barren, RN Outcome: Progressing Goal: Ability to manage health-related needs will improve 10/27/2023 1904 by Annalee Barren, RN Outcome: Progressing 10/27/2023 1904 by Annalee Barren, RN Outcome: Progressing   Problem: Metabolic: Goal: Ability to maintain appropriate glucose levels will improve 10/27/2023 1904 by Annalee Barren, RN Outcome: Progressing 10/27/2023 1904 by Annalee Barren, RN Outcome: Progressing   Problem:  Nutritional: Goal: Maintenance of adequate nutrition will improve 10/27/2023 1904 by Annalee Barren, RN Outcome: Progressing 10/27/2023 1904 by Annalee Barren, RN Outcome: Progressing Goal: Progress toward achieving an optimal weight will improve 10/27/2023 1904 by Annalee Barren, RN Outcome: Progressing 10/27/2023 1904 by Annalee Barren, RN Outcome: Progressing   Problem: Skin Integrity: Goal: Risk for impaired skin integrity will decrease 10/27/2023 1904 by Annalee Barren, RN Outcome: Progressing 10/27/2023 1904 by Annalee Barren, RN Outcome:  Progressing   Problem: Tissue Perfusion: Goal: Adequacy of tissue perfusion will improve 10/27/2023 1904 by Annalee Barren, RN Outcome: Progressing 10/27/2023 1904 by Annalee Barren, RN Outcome: Progressing

## 2023-10-27 NOTE — Progress Notes (Signed)
 Per Dr. Spurgeon Dyer sleeping deeply with snoring and apneic episodes. Order written for pulse ox at nights and to keep HOB> 25 degrees when sleeping. Oxygen prn desaturations.

## 2023-10-27 NOTE — Progress Notes (Signed)
 Occupational Therapy Session Note  Patient Details  Name: Christian Bailey MRN: [id number] Date of Birth: 06-Aug-1940  Today's Date: 10/27/2023 OT Individual Time: 4098-1191 OT Individual Time Calculation (min): 70 min    Short Term Goals: Week 1:  OT Short Term Goal 1 (Week 1): Pt will complete self feeding with Ae as necessary at Min A OT Short Term Goal 2 (Week 1): Pt will complete sit to stand with LRAD at Mod A in preparation for ADLs OT Short Term Goal 3 (Week 1): Pt will complete LB dressing at Max A with AE as necessary  Skilled Therapeutic Interventions/Progress Updates:    Co-tx with recreational therapist. Pt resting in bed upon arrival. See pain below. OT intervention with focus on roling in bed to facilitate doffing/donning pants at bed level. Tot A for rolling but pt able to grasp bed rails and maintain position. Supine>sit EOB with tot A. Sitting balance EOB with min A to close supervision in preparation for TB transfer to w/c. TB transfer to TIS w/c with tot A+2 and max verbal cues for sequencing. Transition to gym and pt engaged in ball taps with dowel - 3x10. Pt reported feeling "woozy" and positioned in further tilt with improvement noted. Pt returned to room and remained in w/c. All needs wihtin reach. NT and RN notified of pt's status.  BP: Seated EOB128/58 (knee high Teds and Ace wraps) Seated in w/c 112/47 Symptoms resolved when pt positioned in tilt in w/c   Therapy Documentation Precautions:  Precautions Precautions: Fall, Cervical Precaution/Restrictions Comments: decreased recall of precautions, not able to verbalize any when prompted Required Braces or Orthoses: Cervical Brace Cervical Brace: Soft collar Restrictions Weight Bearing Restrictions Per Provider Order: No    Pain:  Pt reported BLE discomfort (tightness) from thigh high Ted hose; replaced with knee high Ted hose and Ace wraps replaced with looser wrap; relief noted  Therapy/Group: Individual  Therapy  [name] 10/27/2023, 12:06 PM

## 2023-10-27 NOTE — Plan of Care (Signed)
  Problem: Consults Goal: RH GENERAL PATIENT EDUCATION Description: See Patient Education module for education specifics. Outcome: Progressing   Problem: RH BOWEL ELIMINATION Goal: RH STG MANAGE BOWEL WITH ASSISTANCE Description: STG Manage Bowel with mod I  Assistance. Outcome: Progressing   Problem: RH BOWEL ELIMINATION Goal: RH STG MANAGE BOWEL W/MEDICATION W/ASSISTANCE Description: STG Manage Bowel with Medication with mod I Assistance. Outcome: Progressing   Problem: RH BLADDER ELIMINATION Goal: RH STG MANAGE BLADDER WITH ASSISTANCE Description: STG Manage Bladder With toileting / mod I Assistance Outcome: Progressing   Problem: RH BLADDER ELIMINATION Goal: RH STG MANAGE BLADDER WITH MEDICATION WITH ASSISTANCE Description: STG Manage Bladder With Medication With mod I Assistance. Outcome: Progressing   Problem: RH SAFETY Goal: RH STG ADHERE TO SAFETY PRECAUTIONS W/ASSISTANCE/DEVICE Description: STG Adhere to Safety Precautions With cues Assistance/Device. Outcome: Progressing   Problem: Health Behavior/Discharge Planning: Goal: Ability to identify and utilize available resources and services will improve Outcome: Progressing   Problem: Fluid Volume: Goal: Ability to maintain a balanced intake and output will improve Outcome: Progressing   Problem: Coping: Goal: Ability to adjust to condition or change in health will improve Outcome: Progressing

## 2023-10-27 NOTE — Plan of Care (Signed)
  Problem: Consults Goal: RH GENERAL PATIENT EDUCATION Description: See Patient Education module for education specifics. Outcome: Progressing   Problem: RH BOWEL ELIMINATION Goal: RH STG MANAGE BOWEL WITH ASSISTANCE Description: STG Manage Bowel with mod I  Assistance. Outcome: Progressing Goal: RH STG MANAGE BOWEL W/MEDICATION W/ASSISTANCE Description: STG Manage Bowel with Medication with mod I Assistance. Outcome: Progressing   Problem: RH BLADDER ELIMINATION Goal: RH STG MANAGE BLADDER WITH ASSISTANCE Description: STG Manage Bladder With toileting / mod I Assistance Outcome: Progressing Goal: RH STG MANAGE BLADDER WITH MEDICATION WITH ASSISTANCE Description: STG Manage Bladder With Medication With mod I Assistance. Outcome: Progressing   Problem: RH SAFETY Goal: RH STG ADHERE TO SAFETY PRECAUTIONS W/ASSISTANCE/DEVICE Description: STG Adhere to Safety Precautions With cues Assistance/Device. Outcome: Progressing   Problem: RH PAIN MANAGEMENT Goal: RH STG PAIN MANAGED AT OR BELOW PT'S PAIN GOAL Description: < 4 with prns Outcome: Progressing   Problem: RH KNOWLEDGE DEFICIT GENERAL Goal: RH STG INCREASE KNOWLEDGE OF SELF CARE AFTER HOSPITALIZATION Description: Patient and wife will be able to manage care at discharge using educational resources for medication, skin care,dietary modification and fall prevention independently Outcome: Progressing   Problem: Education: Goal: Ability to describe self-care measures that may prevent or decrease complications (Diabetes Survival Skills Education) will improve Outcome: Progressing Goal: Individualized Educational Video(s) Outcome: Progressing   Problem: Coping: Goal: Ability to adjust to condition or change in health will improve Outcome: Progressing   Problem: Fluid Volume: Goal: Ability to maintain a balanced intake and output will improve Outcome: Progressing   Problem: Health Behavior/Discharge Planning: Goal: Ability  to identify and utilize available resources and services will improve Outcome: Progressing Goal: Ability to manage health-related needs will improve Outcome: Progressing   Problem: Metabolic: Goal: Ability to maintain appropriate glucose levels will improve Outcome: Progressing   Problem: Nutritional: Goal: Maintenance of adequate nutrition will improve Outcome: Progressing Goal: Progress toward achieving an optimal weight will improve Outcome: Progressing   Problem: Skin Integrity: Goal: Risk for impaired skin integrity will decrease Outcome: Progressing   Problem: Tissue Perfusion: Goal: Adequacy of tissue perfusion will improve Outcome: Progressing

## 2023-10-27 NOTE — Patient Care Conference (Signed)
 Inpatient RehabilitationTeam Conference and Plan of Care Update Date: 10/26/2023   Time: 1145 am    Patient Name: Christian Bailey      Medical Record Number: [id number]  Date of Birth: 1940-10-12 Sex: Male         Room/Bed: 4M11C/4M11C-01 Payor Info: Payor: MEDICARE / Plan: MEDICARE PART A AND B / Product Type: *No Product type* /    Admit Date/Time:  10/18/2023  3:33 PM  Primary Diagnosis:  Acute incomplete quadriplegia [facility])  Hospital Problems: Principal Problem:   Acute incomplete quadriplegia (HCC) Active Problems:   Cervical myelopathy (HCC)   Urinary retention   Neurogenic bladder   Transient iatrogenic urethral bleeding   Depression with anxiety    Expected Discharge Date: Expected Discharge Date:  (pending family conference)  Team Members Present: Physician leading conference: Dr. Celia Coles Social Worker Present: Adrianna Albee, LCSW Nurse Present: Jerene Monks, RN PT Present: Gita Lamb, PT OT Present: Nila Barth, OT PPS Coordinator present : Jestine Moron, SLP     Current Status/Progress Goal Weekly Team Focus  Bowel/Bladder   foley, incontinent of bowel   regain continence of b/b   bowel and bladder training    Swallow/Nutrition/ Hydration   discharged from speech therapy on regular diet           ADL's   total A overall, increased tone in BLE, trialed transfer board transfer with Max +1   downgraded to Mod A   tone /spasticity management, transfer training, increase ADL retraining, dynamic sitting balance, family meeting/edu    Mobility   max for transfers with Stedy or Slideboard, up to total assist for bed mobility due to spasticity/tone, TIS w/c   will downgrade goals to min/mod assist w/c level (power mobility will be recommended) transfers  transfers, spasticity/tone management, pain management, balance, NMR for postural control retraining and BLE WB/strengthening, education with pt/family    Communication                 Safety/Cognition/ Behavioral Observations               Pain   pt c/o L leg pain, prn meds given   <3 pain score   Assess qshift and prn    Skin   Skin intact   Remain intact  Assess qshift and prn      Discharge Planning:  Wife was providing care prior to admission but pt coudl stand. Need a family conference to discuss plans and for family to come up with realistic plan. Pt more clear today    Team Discussion: Patient was admitted post C5-7 ACDF secondary to Traumatic incomplete quadriplegia.Patient with confusion,/delirium, insomnia, fluctuating blood sugars: medication adjusted by MD.  Patient limited by pain , spasticity, weakness  of bilateral shoulder and poor balance of bilateral lower extremities.   Patient on target to meet rehab goals: no, Patient requires total A overall with ADLs. Patient requires max assistance for transfers using stedy or slide board due to spasticity/tone. Goals for discharge are set for mod A wheelchair level.   *See Care Plan and progress notes for long and short-term goals.   Revisions to Treatment Plan:  Family Conference Downgraded goals Slide board Stedy Foley catheter  Teaching Needs: Safety, medications, transfers, toileting, etc.    Current Barriers to Discharge: Decreased caregiver support and Neurogenic bowel and bladder  Possible Resolutions to Barriers: Family Education     Medical Summary Current Status: incontinet of bowels- right now- and has foley since  was retention- -spasticity severe-  Barriers to Discharge: Behavior/Mood;Complicated Wound;Self-care education;Spasticity;Weight bearing restrictions;Medical stability;Neurogenic Bowel & Bladder;Incontinence;Other (comments);Uncontrolled Diabetes  Barriers to Discharge Comments: limited by severe spasticity; incomplete quadriplegia; tone really impairs his therapy- has tear in R RTC tear- cannot use to pull doing less than was; CBgs labile cannot add meds since can  also go low- will need ramp Possible Resolutions to [employer] Focus: will need power w/c; stopped baclofen  due to sedation; monitoring CBGs, monitoring spasticity; family conference scheduled- d/c 3 more weeks   Continued Need for Acute Rehabilitation Level of Care: The patient requires daily medical management by a physician with specialized training in physical medicine and rehabilitation for the following reasons: Direction of a multidisciplinary physical rehabilitation program to maximize functional independence : Yes Medical management of patient stability for increased activity during participation in an intensive rehabilitation regime.: Yes Analysis of laboratory values and/or radiology reports with any subsequent need for medication adjustment and/or medical intervention. : Yes   I attest that I was present, lead the team conference, and concur with the assessment and plan of the team.   Jerene Monks 10/26/2023, 1145 am

## 2023-10-28 DIAGNOSIS — G8250 Quadriplegia, unspecified: Secondary | ICD-10-CM | POA: Diagnosis not present

## 2023-10-28 DIAGNOSIS — F4323 Adjustment disorder with mixed anxiety and depressed mood: Secondary | ICD-10-CM

## 2023-10-28 LAB — BASIC METABOLIC PANEL WITH GFR
Anion gap: 11 (ref 5–15)
BUN: 53 mg/dL — ABNORMAL HIGH (ref 8–23)
CO2: 28 mmol/L (ref 22–32)
Calcium: 9.1 mg/dL (ref 8.9–10.3)
Chloride: 101 mmol/L (ref 98–111)
Creatinine, Ser: 1.43 mg/dL — ABNORMAL HIGH (ref 0.61–1.24)
GFR, Estimated: 49 mL/min — ABNORMAL LOW (ref >60)
Glucose, Bld: 158 mg/dL — ABNORMAL HIGH (ref 70–99)
Potassium: 3.8 mmol/L (ref 3.5–5.1)
Sodium: 140 mmol/L (ref 135–145)

## 2023-10-28 LAB — GLUCOSE, CAPILLARY
Glucose-Capillary: 140 mg/dL — ABNORMAL HIGH (ref 70–99)
Glucose-Capillary: 199 mg/dL — ABNORMAL HIGH (ref 70–99)
Glucose-Capillary: 166 mg/dL — ABNORMAL HIGH (ref 70–99)
Glucose-Capillary: 180 mg/dL — ABNORMAL HIGH (ref 70–99)

## 2023-10-28 MED ORDER — BISACODYL 10 MG RE SUPP: 10.0000 mg | Freq: Every day | RECTAL | Status: DC

## 2023-10-28 MED ORDER — MIDODRINE HCL 5 MG PO TABS
5.0000 mg | ORAL_TABLET | Freq: Three times a day (TID) | ORAL | Status: DC
  Administered 2023-10-28 – 2023-11-09 (×35): 5 mg via ORAL
  Filled 2023-10-28 (×35): qty 1

## 2023-10-28 MED ORDER — BISACODYL 10 MG RE SUPP
10.0000 mg | Freq: Every day | RECTAL | Status: DC
  Administered 2023-10-28 – 2023-11-23 (×27): 10 mg via RECTAL
  Filled 2023-10-28 (×28): qty 1

## 2023-10-28 NOTE — Progress Notes (Signed)
 MD lovorn made aware  10/28/23 0835  Orthostatic Lying   BP- Lying 104/63  Pulse- Lying 101  Orthostatic Sitting  BP- Sitting 100/65  Pulse- Sitting 87  Orthostatic Standing at 0 minutes  BP- Standing at 0 minutes 94/49  Pulse- Standing at 0 minutes 144

## 2023-10-28 NOTE — Progress Notes (Addendum)
 New pressure noted to pt sacrum. Charge nurse notified. Photo obtained. Sacral pad placed, pt placed on left side with pillow.   Found right heel DTI- charge notified, photo obtained, heel foam placed bilaterally and heels floated. Pt/family educated on pressure injury prevention and care. Varney Gentleman, LPN

## 2023-10-28 NOTE — Progress Notes (Signed)
 PROGRESS NOTE   Subjective/Complaints:  Pt reports couldn't tolerate the ACE and TEDs yesterday for low BP- - felt like tourniquet- also spoke to OT about this- they tried Thigh higha dn knee high TEDs and ACE wraps.  BP still dropped into 90's systolic.   Sometimes pt reports he slides down in bed- and all clothes get behind him- needs to be repositioned.  In "hog heaven" due to care.   LBM this AM- was incontinent.  Sleeping OK/well.   Said "spitting up less".  Said not having a lot of pain- but sor ein LE's ROS:   Pt denies SOB, abd pain, CP, N/V/C/D, and vision changes   Objective:   No results found.   Recent Labs    10/26/23 0510  WBC 8.2  HGB 9.2*  HCT 28.1*  PLT 383    Recent Labs    10/26/23 0510 10/28/23 0613  NA 137 140  K 4.3 3.8  CL 102 101  CO2 27 28  GLUCOSE 168* 158*  BUN 70* 53*  CREATININE 1.59* 1.43*  CALCIUM  8.7* 9.1     Intake/Output Summary (Last 24 hours) at 10/28/2023 0942 Last data filed at 10/28/2023 0811 Gross per 24 hour  Intake 440 ml  Output 1700 ml  Net -1260 ml        Physical Exam: Vital Signs Blood pressure 111/62, pulse 99, temperature 98 F (36.7 C), resp. rate 16, height 5\' 4"  (1.626 m), weight 60.6 kg, SpO2 98%.       General: awake, alert, appropriate, woke easily- not confused this AM again; NAD HENT: conjugate gaze; oropharynx moist CV: regular rate and borderline tachycardic- regular rhythm; no JVD Pulmonary: less coarse- needed to clear throat a few times then clear GI: soft, NT, ND, (+)BS- normoactive Psychiatric: appropriate- very interactive and not confused Neurological: Ox3- MAS of 2-3 in LE's MSK: Ue's 4/5 B/L throughout LE's- 2-/5 B/L-in HF/KE- 4-/5 DF/PF but RLE weaker than LLE GU- foley in place PRIOR EXAMS: Spasticity- MAS of 1+ to 2 in Ue's this Am and 3 in RLE- MAS worse this AM- 2-3 throughout Neurologic: Cranial nerves II  through XII intact, motor strength is 4/5 in bilateral  bicep, tricep, grip,and finger ext and hand intrinsics, 3- RIght delt and 4/5 left delt, 3- bilateral  hip flexor, 3- RIght and 4- left knee extensors, 4/5 bilateral ankle dorsiflexor and plantar flexor Sensory exam normal sensation to light touch and proprioception in bilateral upper and lower extremities Tone- 3+ a bilateral biceps triceps and BR, + Hoffman's bilateral 2/5 RIght 3+ left knee Also MAS 3 tone in RIght Knee flexors and extensors, MAS 1 at the left knee Clonus at bilateral ankles  Musculoskeletal: Full range of motion in all 4 extremities. No joint swelling   Assessment/Plan: 1. Functional deficits which require 3+ hours per day of interdisciplinary therapy in a comprehensive inpatient rehab setting. Physiatrist is providing close team supervision and 24 hour management of active medical problems listed below. Physiatrist and rehab team continue to assess barriers to discharge/monitor patient progress toward functional and medical goals  Care Tool:  Bathing  Bathing assist Assist Level: Dependent - Patient 0%     Upper Body Dressing/Undressing Upper body dressing        Upper body assist Assist Level: Moderate Assistance - Patient 50 - 74%    Lower Body Dressing/Undressing Lower body dressing            Lower body assist Assist for lower body dressing: Dependent - Patient 0%     Toileting Toileting    Toileting assist Assist for toileting: Dependent - Patient 0%     Transfers Chair/bed transfer  Transfers assist     Chair/bed transfer assist level: Dependent - mechanical lift     Locomotion Ambulation   Ambulation assist   Ambulation activity did not occur: Safety/medical concerns  Assist level: 2 helpers   Max distance: 5'   Walk 10 feet activity   Assist  Walk 10 feet activity did not occur: Safety/medical concerns        Walk 50 feet activity   Assist  Walk 50 feet with 2 turns activity did not occur: Safety/medical concerns         Walk 150 feet activity   Assist Walk 150 feet activity did not occur: Safety/medical concerns         Walk 10 feet on uneven surface  activity   Assist Walk 10 feet on uneven surfaces activity did not occur: Safety/medical concerns         Wheelchair     Assist Is the patient using a wheelchair?: Yes Type of Wheelchair: Manual    Wheelchair assist level: Dependent - Patient 0% Max wheelchair distance: 150    Wheelchair 50 feet with 2 turns activity    Assist        Assist Level: Dependent - Patient 0%   Wheelchair 150 feet activity     Assist      Assist Level: Dependent - Patient 0%   Blood pressure 111/62, pulse 99, temperature 98 F (36.7 C), resp. rate 16, height 5\' 4"  (1.626 m), weight 60.6 kg, SpO2 98%.  Medical Problem List and Plan: 1. Functional deficits secondary to Traumatic incomplete quadriplegia s/p C5-7 ACDF 10/12/23,  ASIA C/D- has R>L lower ext weakness as primary neurologic deficits             -patient may  shower             -ELOS/Goals: Sup 14-16d  D/c ~ 3 more weeks Family conference set for 10am Thursday  Today  Cont' CIR PT and OT  Adding Midodrine  for hypotension/OH 2.  Antithrombotics: -DVT/anticoagulation:  Mechanical: Sequential compression devices, below knee Bilateral lower extremities 4/23- will see if Dr Ellery Guthrie will allow us  to start Lovenox - he said yes, so will start. 30mg  daily.             -antiplatelet therapy: N/A 3. Pain Management: tylenol  prn. Voltaren  QID, lyrica  50mg  BID, flexeril  PRN -10/24/23 leg muscle soreness likely from PT/OT this week; will start muscle rub cream 4/28- still c/o muscle soreness in legs- con't pain regimen as needed 4/30- still sore, but don't want Flexeril  since likely caused confusion- con't tylenol  prn- can use muscle rub which is ordered 5/1- denies a lot of pain- mainly sore form  spasticity/working out 4. Mood/Behavior/Sleep: LCSW to follow up for evaluation and support when appropriate --Delirium precautions--His mental status has cleared but will monitor for worsening with transfer to new unit .pt has not required prn meds for agitation in several days  Monitor sleep  wake cycle             -antipsychotic agents: Change haldol  q 6 hrs prn to Seroquel  12.5mg  tid prn  -Restoril  7.5mg  nightly, cymbalta  20mg  daily 4/23-4/25 no delirium so far 4/28- having some delirium this AM? Not severe, but will monitor since has had in recent past 4/29- still confused this AM- but per nursing , was less confused overnight after Restoril  stopped- still pleasantly confused this AM when first woke up- didn't open eyes.  4/30-5/1 Aox3 this AM 5. Neuropsych/cognition: This patient is usually  capable of making decisions on his own behalf. 6. Skin/Wound Care: Routine pressure relief measures. 7. Fluids/Electrolytes/Nutrition:  Monitor I/O. Routine labs. Continue vitamins/supplements. 8. 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  -10/23/23 foley replaced yesterday by Dr. Willye Harvey, per note, leave in place for 7-10d, Consider  resuming In-N-Out catheterization for management of his neurogenic bladder at that time. Recommend using a coud catheter for intermittent catheterization (when resumed).  4/28- will leave foley for 7-10 days 10.  Diabetes- A1c 8.0-  with peripheral neuropathy by hx only on Jardiance at home, use SSI in the hospital  -10/23/23 CBGs variable but likely d/t steroids; cont SSI for now  4/29- CBG's very labile in last 24 hours 99-383- - it concerns me to start Jardiance since of Foley and risk of UTI's- also for starting Tradjenta  since BG was 99 last night- don't want to drop BG too much  4/30- Will add Tradjenta  since BG's still running 150-242- will monitor closely for CBG hypoglycemia-   5/1- Cbgs look better 140-215- will con't regimen CBG (last 3)  Recent Labs    10/27/23 1627 10/27/23 2118 10/28/23 0601  GLUCAP 140* 215* 140*     11.  Spasticity affecting LEs> UE and R>L , aggressive ROM  with PT, consider oral meds  4/22- starting baclofen  5 mg TID- with his Cr 1.50- has been running 1.68 to 2.30  4/24- will not increase baclofen  due to Renal issues 12.  PAD s/p SFA and Iliac stenting, continue metoprolol  50mg  daily  13. CKD3b? With AKI -4/22- Pt's Cr down to 1.50 from 2.29 2 days ago and 1.68 yesterday- will monitor 2x/week to see if will reduce further or be labile.   4/24- Cr 1.84- and BUN stable at 55- is drinking per pt  4/28- Cr 1.63- doing better  4/29- Cr 1.59 after 12+ hours of IVF's- and BUN very slightly down to 70 from 72-   5/1- Cr down to 1.43 and BUN down to 53 14. Questionable neurogenic bowel: continue miralax  32g daily, senokot s 2 tabs qAM 4/24- required suppository 2 days ago however had BM on bedpan yesterday- thinks is mild.   4/25- smear  that was incontinent last night -10/23/23 LBM last night per pt was very hard still, but documented as loose-- doesn't like miralax  (on 32g daily)-- will add colace 200mg  daily, see if this helps, if continued loose stools then  would stop the miralax  first. Can mix miralax  with half juice/half water.  -10/24/23 looser stools yesterday, will stop miralax , leave colace-- pt prefers this  4/28- LBM 2 days ago  4/29- LBM small liquid stool 4/28- and 1 medium stool as well-   4/30- LBM overnight  5/1- LBM this Am- was accident- migh tneed bowel program 15. HLD: crestor  20mg  nightly 16. Delirium/Confusion  4/29- yesterday given IVF's for this- checked U/A and Cx- which showed mod leuks, however rate bacteria and very little WBC's 6-10- which is not a UTI in most cases- will d/w pharmacy-   -pt still pleasantly confused this AM- but didn't even open eyes, was so sleepy, so hard to tease out what is due to sleepiness and what is actual delirium- per nurse, much better last night-   4/30- pt Ox3 this AM- very with it- likely flexeril  based on chart review- given for pain since on no pain meds- will keep off Flexeril  and monitor Sx's  -also d/w nursing to move pt's room up to 3 since was having confusion 17. Hx of HTN with severe hypotension  4/30- pt's BP dropped to 79 systolic yesterday when sitting with OT- not clear if had TEDS/ACE wraps- will reduce Metoprolol  XL to 12.5 mg daily- from 50 mg daily and see if that helps- don't want ot add midodrine  quite yet.  5/1- Adding midodrine  5 mg TID and adding abd binder- couldn't tolerate ACE wraps at all- just TED's-   18. Atelectasis  4/30- sounds coarse- asked pt to use ICS regularly- is at bedside- also described "spitting things up"_ had "spit cup"- at bedside-  had barely anything in it.    I spent a total of  59  minutes on total care today- >50% coordination of care- due to  Family conference 35 minutes- as well as Review of all labs, vitals- changing meds for hypotension/OH and more strength exam done this AM.   Also d/w OT and nursing several times today   LOS: 10 days A FACE TO FACE EVALUATION WAS PERFORMED  Dayana Dalporto 10/28/2023, 9:42 AM

## 2023-10-28 NOTE — Progress Notes (Signed)
 Patient ID: Christian Bailey, male   DOB: [birth year], 83 y.o.   MRN: [id number]  Patient/Family Conference  Patient/family in attendance: Pt, wife and Warehouse manager in attendance: Dr Raynaldo Call, Becky-Sw, Angelina-Care Coordinator, Allison-PT and Tom-OT  Main focus:Pt's care needs and plan for stay here   Synopsis of information shared:MD shared medical issues and BP along with bowel and bladder issues and plan. Therapists shared levels and care needed at discharge. Family wants to take him home and will work on care needs, ie ramp, learning care and what they need to do to prepare.  Barriers/concerns expressed by patient and family: Wanting to be prepared for discharge and care pt will require at discharge.   Patient/family response:Pt and family appreciative of meeting by team. Will prepare and began learning pt's care. Wife will be here for his bowel and bladder programs. Will begin getting ramp for home and what they need to do to prepare for pt coming home.   Follow-up/action plans:See above Have two more weeks to work on progress and needs for discharge

## 2023-10-28 NOTE — Consult Note (Signed)
 Neuropsychological Consultation [facility]   Patient:   Christian Bailey   DOB:   19-Jul-1940  MR Number:  [id number]  Location:  [facility] [address] [place] [address] Dept: [phone] Loc: [phone]           Date of Service:   10/27/2023  Start Time:   2 PM End Time:   3 PM  Provider/Observer:  Chapman Commodore, Psy.D.       Clinical Neuropsychologist       Billing Code/Service: [phone]  Reason for Service:    [name] is an 83 year old male referred for neuropsychological consultation following cervical spinal cord injury with incomplete quadriplegia.  Patient has had significant difficulties with lethargy, sleepiness etc.  Previously when I attempted to see the patient I was unable to wake him.  Today's visit was similar with the patient very hard to wake up.  As the patient has hard of hearing auditory alerts to me coming in the room were not successful.  Using shoulder rub I was able to eventually arouse the patient but even then he remained quite lethargic and sleepy.  The patient is having adjustment issues related to his significant deficits.  Patient's postoperative delirium has improved but I suspect that even with the discontinuation of medicines like Haldol  or Seroquel  that there is still drug levels with degree of medications after his spinal cord injury.  We did work for some time regarding coping and adjustment with the patient's expected discharge to skilled nursing and what that entails as it is unlikely the patient's wife will be able to manage him postdischarge.   HPI for the current admission:    HPI: Christian Bailey is an 83 year old male with history fo T2DM, severe PAD s/p multiple vascular procedures, macular degeneration/retinal detachment, GIB, polyneuropathy, recurrent falls X anxiety/depression who was admitted with after a fall where he struck his head against  the wall and had complaints of neck and back pain. He was found to have edematous changes due to acute ligamentous injury C2-C5, diffuse edema posterior cervical musculature, compression of spine at C5/6 and C6/7 due to DDD and ossification of posterior longitudinal ligament,   The patient states he has had onset of weakness around 6 months ago.  He has also noticed difficulty emptying his bladder.  His primary care physician saw him and started him on Myrbetriq .  He has not seen a urologist.  He has a history of diabetes with painful neuropathy and was managed with Jardiance at home.  He has required insulin  since hospitalization.  The patient states his wife has been assisting with showering and bathing prior to admission.  He was walking with a walker prior to admission. He states that he has had difficulty moving the right shoulder since a reverse shoulder arthroplasty done several years ago by Dr. Brunilda Capra. The patient experienced postoperative delirium and in fact had psychiatry consult on 10/11/2023.  They recommended change from Seroquel  to Haldol .  The patient had resolution of the symptoms and has not been using Haldol  for several days. Palliative care was consulted for goals of care.  She was prior to surgery.  Limited DNR status.  Palliative signed off  Medical History:   Past Medical History:  Diagnosis Date   Anemia    Complication of anesthesia    Diabetes mellitus (HCC)    TYPE 2   GERD (gastroesophageal reflux disease)  History of blood transfusion    GI bleed   History of colon polyps    History of hiatal hernia    Hyperlipemia    Hypertension    Osteoarthritis    PAD (peripheral artery disease) (HCC)    a. stenting of his left common iliac artery >20 years ago. b. h/o LEIA stent and 2 stents to R SFA in 2011. c. 04/2014:  s/p PTA of right SFA for in-stent restenosis, occluded left SFA   PONV (postoperative nausea and vomiting)    no porblem with the last 3 surgeries   RBBB  (right bundle branch block with left anterior fascicular block)    NUCLEAR STRESS TEST, 08/18/2010 - no significant wall motion abnoramlities noted, post-stress EF 69%, normal myocardial perfusion study   Sinus tachycardia    a. Noted during admission 04/2014 but upon review seems to be frequent finding for patient.   Stenosis of carotid artery    a. 50% right carotid stenosis by angiogram 04/2014.         Patient Active Problem List   Diagnosis Date Noted   Adjustment disorder with mixed anxiety and depressed mood 10/28/2023   Depression with anxiety 10/25/2023   Urinary retention 10/22/2023   Neurogenic bladder 10/22/2023   Transient iatrogenic urethral bleeding 10/22/2023   Acute incomplete quadriplegia (HCC) 10/19/2023   Spinal cord compression (HCC) 10/18/2023   Unsteady gait 10/18/2023   Neck pain 10/18/2023   Cervical myelopathy (HCC) 10/18/2023   Delirium due to another medical condition, acute, hyperactive 10/09/2023   Intractable back pain 10/03/2023   Fall 10/02/2023   Acute renal failure superimposed on stage 3b chronic kidney disease, unspecified acute renal failure type (HCC) 05/07/2023   Leukocytosis 05/07/2023   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) 03/25/2020   Exudative age-related macular degeneration of right eye with inactive choroidal neovascularization (HCC) 03/25/2020   Advanced nonexudative age-related macular degeneration of right eye with subfoveal involvement 03/25/2020   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 involvement 03/25/2020   Bacteremia due to Gram-negative bacteria 01/19/2019   CKD (chronic kidney disease) stage 3, GFR 30-59 ml/min (HCC) 01/19/2019   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) 07/06/2018   S/P shoulder replacement, right 12/07/2016   PVD (peripheral vascular disease) (HCC) 09/18/2016   Sinus  tachycardia 05/15/2014   Carotid artery disease (HCC) 04/26/2014   PAD (peripheral artery disease) (HCC) 06/12/2013   RBBB (right bundle branch block with left anterior fascicular block) 06/12/2013   Claudication (HCC) 12/14/2012   Essential hypertension 12/14/2012   Hyperlipidemia 12/14/2012   Type 2 diabetes mellitus (HCC) 12/14/2012   History of colonic polyps 01/25/2012   DM 08/11/2010   Acute duodenal ulcer with hemorrhage 08/11/2010    Behavioral Observation/Mental Status:   Matisyahu Fewkes  presents as a 83 y.o.-year-old Right handed Caucasian Male who appeared his stated age. his dress was Appropriate and he was Well Groomed and his manners were Appropriate to the situation.  his participation was indicative of Drowsy and Inattentive behaviors.  There were physical disabilities noted.  he displayed an appropriate level of cooperation and motivation.    Interactions:    Minimal Drowsy  Attention:   abnormal and attention span appeared shorter than expected for age  Memory:   abnormal; remote memory intact, recent memory impaired  Visuo-spatial:   not examined  Speech (Volume):  low  Speech:   normal; normal  Thought Process:  Circumstantial  Distracted and Unfocused  Though Content:  WNL; not suicidal and not homicidal  Orientation:   person, place, and situation  Judgment:   Fair  Planning:   Poor  Affect:    Flat and Lethargic  Mood:    Dysphoric  Insight:   Fair  Intelligence:   normal  Family Med/Psych History:  Family History  Problem Relation Age of Onset   Heart disease Mother    Leukemia Mother    Stomach cancer Father    Esophageal cancer Brother    Liver disease Brother    Alcoholism Brother    Leukemia Brother    Leukemia Brother    Diabetes type II Brother    Lung disease Sister   Impression/DX:   Ricquan Elza is an 83 year old male referred for neuropsychological consultation following cervical spinal cord injury with incomplete  quadriplegia.  Patient has had significant difficulties with lethargy, sleepiness etc.  Previously when I attempted to see the patient I was unable to wake him.  Today's visit was similar with the patient very hard to wake up.  As the patient has hard of hearing auditory alerts to me coming in the room were not successful.  Using shoulder rub I was able to eventually arouse the patient but even then he remained quite lethargic and sleepy.  The patient is having adjustment issues related to his significant deficits.  Patient's postoperative delirium has improved but I suspect that even with the discontinuation of medicines like Haldol  or Seroquel  that there is still drug levels with degree of medications after his spinal cord injury.  We did work for some time regarding coping and adjustment with the patient's expected discharge to skilled nursing and what that entails as it is unlikely the patient's wife will be able to manage him postdischarge.          Electronically Signed   _______________________ Chapman Commodore, Psy.D. Clinical Neuropsychologist

## 2023-10-28 NOTE — Progress Notes (Signed)
 Occupational Therapy Session Note  Patient Details  Name: Christian Bailey MRN: [id number] Date of Birth: October 25, 1940  Today's Date: 10/28/2023 OT Individual Time: 1115-1200 OT Individual Time Calculation (min): 45 min    Short Term Goals: Week 1:  OT Short Term Goal 1 (Week 1): Pt will complete self feeding with Ae as necessary at Min A OT Short Term Goal 2 (Week 1): Pt will complete sit to stand with LRAD at Mod A in preparation for ADLs OT Short Term Goal 3 (Week 1): Pt will complete LB dressing at Max A with AE as necessary  Skilled Therapeutic Interventions/Progress Updates:    Pt resting in w/c upon arrival. OT intervention with focus on OOB tolerance/upright sitting and BUE strengthening/function to increase indepencence with BADLs. No OH s/s during session. See BP reading below. Table tasks stacking/unstacking cups requiring adequate grasp with contralateral hand while removing cups. Table tasks with [name] removing and replacing on table. Pt with adequate grasp to pull Squigz from trable. Pt returned to room and remained in w/c. All needs within reach.  BP seated in w/c with knee high Ted hose 113/34 HR 115 (pt tilted back after reading) 121/81 HR 117 in tilted position 126/66 in upright position  Pt with no s/s throughout session  Therapy Documentation Precautions:  Precautions Precautions: Fall, Cervical Precaution/Restrictions Comments: decreased recall of precautions, not able to verbalize any when prompted Required Braces or Orthoses: Cervical Brace Cervical Brace: Soft collar Restrictions Weight Bearing Restrictions Per Provider Order: No Pain: Pt c/o 2/10 BLE "tenderness"; emotional support and repositioning   Therapy/Group: Individual Therapy  [name] 10/28/2023, 12:11 PM

## 2023-10-28 NOTE — Progress Notes (Signed)
 Physical Therapy Session Note  Patient Details  Name: Christian Bailey MRN: [id number] Date of Birth: [birth year]  Today's Date: 10/28/2023 PT Individual Time: 0830-0902 PT Individual Time Calculation (min): 32 min   Short Term Goals: Week 1:  PT Short Term Goal 1 (Week 1): Pt will initiate gait training PT Short Term Goal 1 - Progress (Week 1): Met PT Short Term Goal 2 (Week 1): Pt will perform STS with mod a consistently PT Short Term Goal 2 - Progress (Week 1): Not met PT Short Term Goal 3 (Week 1): Pt will perform bed mobility with mod a PT Short Term Goal 3 - Progress (Week 1): Not met  Skilled Therapeutic Interventions/Progress Updates:    Pt presents in bed with nursing present. Donned knee high teds with total assist for BP management to trial today without ACE wraps - pt reporting unable to tolerate ACE wraps on top of Teds yesterday. Donned pants with total assist as well. NMR during rolling in the bed with max assist (required assist with bending both knees and the to initiate roll with mod to max assist to get onto full sidelying both directions to pull up pants. Max assist for supine to sit with pt able to initiate but not complete bringing legs off the bed (max for trunk). Focused on Slideboard transfer to w/c with emphasis on technique, head/hips relationship, and hand placement with cues for pt to attempt to scoot hips (initially pt required total to get onto the board but was able to attempt to scoot with mod to max assist once on the board). BP values taken (see below for details). In gym focused on NMR for sit <> stand and standing tolerance/balance/postural control retraining. Pt required max assist for sit > stand in parallel bars with facilitation for anterior weightshift and cues for upright trunk extension and hip extension, difficulty achiveing upright posture but able to activate through glutes - stood about 1-2 min but then when going to return to seated position due to UE  fatigue, pt's B knee buckled and required total assist to control descent into the w/c. Max +2 for repositioning in the w/c. Returned to room and left up with TIS w/c and all needs in reach.   Therapy Documentation Precautions:  Precautions Precautions: Fall, Cervical Precaution/Restrictions Comments: decreased recall of precautions, not able to verbalize any when prompted Required Braces or Orthoses: Cervical Brace Cervical Brace: Soft collar Restrictions Weight Bearing Restrictions Per Provider Order: No    Vital Signs: Supine with HOB elevated: BP = 104/63 mmHg EOB with Tedhose donned: BP = 100/65 mmHg; HR =87 bpm ; asymptomatic Standing with Tedhose donned: BP = 94/49 mmHg; HR = 144 bpm; asymptomatic Seated after standing with Tedhose: BP = 110/61 mmHg; HR = 119 bpm; O2 = 92%; asymptomatic Pain: Pain Assessment Pain Scale: 0-10 Pain Score: 0-No pain     Therapy/Group: Individual Therapy  Gita Lamb [name], PT, DPT, CBIS  10/28/2023, 11:20 AM

## 2023-10-28 NOTE — Progress Notes (Signed)
 Occupational Therapy Session Note  Patient Details  Name: Christian Bailey MRN: [id number] Date of Birth: [birth year]  Today's Date: 10/28/2023 OT Individual Time: 1300-1345 OT Individual Time Calculation (min): 45 min    Short Term Goals: Week 1:  OT Short Term Goal 1 (Week 1): Pt will complete self feeding with Ae as necessary at Min A OT Short Term Goal 2 (Week 1): Pt will complete sit to stand with LRAD at Mod A in preparation for ADLs OT Short Term Goal 3 (Week 1): Pt will complete LB dressing at Max A with AE as necessary  Skilled Therapeutic Interventions/Progress Updates:    Pt resting in w/c upon arrival. Pt reported that he thought he needed to have BM. Skilled OT services with focus on SB transfers, sitting balance EOB, bed mobility, and toileting at bed level to increase independence with BADLs. While seated in w/c wihtout leg rest pt able to extend knees independently without assistance. SB transfer to EOB with tot A and +2 for safety. Sitting balance EOB with min A prior to transitioning to supine. Sit>supine with tot A+2. Pt incontinent of bowel. Dependent for hygiene and clothing mgmt. Pt required max A for rolling R/L to facilitate hygiene and clothing mgmt. Sacral redness noted and RN/charge nurse notified. Foam dressing placed on buttocks. Pt remained in bed with all needs wihtin reach. Bed alarm activated.   Therapy Documentation Precautions:  Precautions Precautions: Fall, Cervical Precaution/Restrictions Comments: decreased recall of precautions, not able to verbalize any when prompted Required Braces or Orthoses: Cervical Brace Cervical Brace: Soft collar Restrictions Weight Bearing Restrictions Per Provider Order: No   Pain:  Pt c/o BLE "tenderness" 2/10; repositioned    Therapy/Group: Individual Therapy  Christian Bailey 10/28/2023, 1:47 PM

## 2023-10-28 NOTE — Progress Notes (Signed)
 Physical Therapy Session Note  Patient Details  Name: Christian Bailey MRN: [id number] Date of Birth: [birth year]  Today's Date: 10/28/2023 PT Individual Time: 1020-1030 PT Individual Time Calculation (min): 10 min   Short Term Goals: Week 2:  PT Short Term Goal 1 (Week 2): Pt will be able to perform bed mobility with mod assist PT Short Term Goal 2 (Week 2): Pt will be able to perform bed <> w/c transfer with mod assist PT Short Term Goal 3 (Week 2): Pt will be able to tolerate standing for weightbearing x 5 min PT Short Term Goal 4 (Week 2): Pt will initiate PWC mobility  Skilled Therapeutic Interventions/Progress Updates:    Family meeting with MD, RN care coordinator, therapists (PT/OT), CSW, patient and pt's family (wife and son) with focus on education about pt's current level of function, goals, barrier, and equipment recommendations at this time. Educated on pt's current functional status of max to total A with +2 needed at times, BP issues, spasticity impacting mobility, use of TIS with plan to progress to power w/c, and overall goals of min to mod assist at w/c level for long term goals. Recommending that pt have 24/7 hands on assistance and recommending ramp for home entry as well as to start thinking about home accessibility and transportation for power w/c. Recommended family come in for observation of therapy sessions now and then will do hands on training closer to d/c.   Therapy Documentation Precautions:  Precautions Precautions: Fall, Cervical Precaution/Restrictions Comments: decreased recall of precautions, not able to verbalize any when prompted Required Braces or Orthoses: Cervical Brace Cervical Brace: Soft collar Restrictions Weight Bearing Restrictions Per Provider Order: No General: PT Amount of Missed Time (min): 10 Minutes PT Missed Treatment Reason: Other (Comment) (family late for family meeting)  Pain: Pain Assessment Pain Scale: 0-10 Pain Score: 0-No  pain    Therapy/Group: Individual Therapy  Gita Lamb [name], PT, DPT, CBIS  10/28/2023, 11:29 AM

## 2023-10-28 NOTE — Progress Notes (Signed)
 Occupational Therapy Session Note  Patient Details  Name: Christian Bailey MRN: [id number] Date of Birth: February 11, 1941  Today's Date: 10/28/2023 OT Co-Treatment Time: 1010-1020 OT Co-Treatment Time Calculation (min): 10 min Cotx with PT for family conference 1010-1030 total time 20 mins (family late for conference  Short Term Goals: Week 1:  OT Short Term Goal 1 (Week 1): Pt will complete self feeding with Ae as necessary at Min A OT Short Term Goal 2 (Week 1): Pt will complete sit to stand with LRAD at Mod A in preparation for ADLs OT Short Term Goal 3 (Week 1): Pt will complete LB dressing at Max A with AE as necessary  Skilled Therapeutic Interventions/Progress Updates:    Cotx with PT for family conference with wife, son, pt, MD, Nurse care mgr, and CSW. Reviewed pt's current funcitonal level and LTGs. Reviewed focus of therapy. Discussed scheduling family education prior to d/c home. Pt resting in w/c during mtg. Pt remained in w/c with all needs within reahc.   Therapy Documentation Precautions:  Precautions Precautions: Fall, Cervical Precaution/Restrictions Comments: decreased recall of precautions, not able to verbalize any when prompted Required Braces or Orthoses: Cervical Brace Cervical Brace: Soft collar Restrictions Weight Bearing Restrictions Per Provider Order: No  Pain: Pain Assessment Pain Scale: 0-10 Pain Score: 0-No pain   Therapy/Group: Individual Therapy  [name] 10/28/2023, 12:10 PM

## 2023-10-28 NOTE — Progress Notes (Signed)
 Physical Therapy Session Note  Patient Details  Name: Christian Bailey MRN: [id number] Date of Birth: 05-Feb-1941  Today's Date: 10/28/2023 PT Individual Time: 1500-1545 PT Individual Time Calculation (min): 45 min   Short Term Goals: Week 2:  PT Short Term Goal 1 (Week 2): Pt will be able to perform bed mobility with mod assist PT Short Term Goal 2 (Week 2): Pt will be able to perform bed <> w/c transfer with mod assist PT Short Term Goal 3 (Week 2): Pt will be able to tolerate standing for weightbearing x 5 min PT Short Term Goal 4 (Week 2): Pt will initiate PWC mobility  Skilled Therapeutic Interventions/Progress Updates: Pt presented in bed sleeping but easily aroused. Pt states mild unrated pain in LLE however no intervention requested and resolved with ROM. Discussed with pt how much assist SO would be able to provide physically with pt stating some but unable to perform heavy assist. Discussed with pt performing LE therex to facilitate bed mobility with pt agreeable. Pt performed the following activities.   AA heel slides SAQ x 10 bilaterally Modified bridges off bolster - pt able to complete glute set but unable to clear bed Manually resisted leg press with pt able to bring knee toward chest without assist Hip abd/add  Pt was able to complete partial range heel slides and with PTA stabilizing feet pt performed partial rolls x 5 bilaterally. Pt did require total A x 2 to boost to [facility]. Pt repositioned to comfort and left with bed alarm on, call bell within reach and needs met.        Therapy Documentation Precautions:  Precautions Precautions: Fall, Cervical Precaution/Restrictions Comments: decreased recall of precautions, not able to verbalize any when prompted Required Braces or Orthoses: Cervical Brace Cervical Brace: Soft collar Restrictions Weight Bearing Restrictions Per Provider Order: No   Therapy/Group: Individual Therapy  Chrishawn Boley 10/28/2023, 4:09 PM

## 2023-10-29 DIAGNOSIS — G959 Disease of spinal cord, unspecified: Secondary | ICD-10-CM | POA: Diagnosis not present

## 2023-10-29 LAB — GLUCOSE, CAPILLARY
Glucose-Capillary: 127 mg/dL — ABNORMAL HIGH (ref 70–99)
Glucose-Capillary: 193 mg/dL — ABNORMAL HIGH (ref 70–99)
Glucose-Capillary: 155 mg/dL — ABNORMAL HIGH (ref 70–99)
Glucose-Capillary: 179 mg/dL — ABNORMAL HIGH (ref 70–99)

## 2023-10-29 MED ORDER — PROSOURCE PLUS PO LIQD
30.0000 mL | Freq: Every day | ORAL | Status: DC
  Administered 2023-10-30 – 2023-11-23 (×24): 30 mL via ORAL
  Filled 2023-10-29 (×25): qty 30

## 2023-10-29 MED ORDER — ENSURE ENLIVE PO LIQD
237.0000 mL | Freq: Two times a day (BID) | ORAL | Status: DC
  Administered 2023-10-29 – 2023-11-19 (×25): 237 mL via ORAL
  Filled 2023-10-29: qty 237

## 2023-10-29 MED ORDER — ADULT MULTIVITAMIN W/MINERALS CH
1.0000 | ORAL_TABLET | Freq: Every day | ORAL | Status: DC
  Administered 2023-10-29: 1
  Filled 2023-10-29: qty 1

## 2023-10-29 MED ORDER — GERHARDT'S BUTT CREAM
TOPICAL_CREAM | Freq: Three times a day (TID) | CUTANEOUS | Status: DC
  Administered 2023-10-30 – 2023-11-21 (×4): 1 via TOPICAL
  Filled 2023-10-29: qty 60

## 2023-10-29 MED ORDER — ADULT MULTIVITAMIN W/MINERALS CH
1.0000 | ORAL_TABLET | Freq: Every day | ORAL | Status: DC
  Administered 2023-10-30 – 2023-11-24 (×26): 1 via ORAL
  Filled 2023-10-29 (×26): qty 1

## 2023-10-29 NOTE — Progress Notes (Addendum)
 Physical Therapy Session Note  Patient Details  Name: Christian Bailey MRN: [id number] Date of Birth: 08/19/1940  Today's Date: 10/29/2023 PT Individual Time: 0915-1000 PT Individual Time Calculation (min): 45 min   Short Term Goals: Week 1:  PT Short Term Goal 1 (Week 1): Pt will initiate gait training PT Short Term Goal 1 - Progress (Week 1): Met PT Short Term Goal 2 (Week 1): Pt will perform STS with mod a consistently PT Short Term Goal 2 - Progress (Week 1): Not met PT Short Term Goal 3 (Week 1): Pt will perform bed mobility with mod a PT Short Term Goal 3 - Progress (Week 1): Not met Week 2:  PT Short Term Goal 1 (Week 2): Pt will be able to perform bed mobility with mod assist PT Short Term Goal 2 (Week 2): Pt will be able to perform bed <> w/c transfer with mod assist PT Short Term Goal 3 (Week 2): Pt will be able to tolerate standing for weightbearing x 5 min PT Short Term Goal 4 (Week 2): Pt will initiate PWC mobility  Skilled Therapeutic Interventions/Progress Updates:    Pt presents in bed but easily awaken. Discussed overall response from family conference yesterday with pt stating he feels like the ultimate decision is up to his wife but is concerned about her ability to provide the amount of assist he needs, especially in regards to bowel/bladder function. Discussed options in regards to short term SNF for continued rehab as well as time to get hired caregivers or ramp, etc into place as pt expressed concern in regard to this as well.   Focused on NMR for stretching, active ROM, and PROM to BLE prior to OOB to decrease spasticity/tone in BLE (R > L). Total assist for donning of pants and Tedhose with max for rolling to the R and L for pulling up pants. Max assist for transition from supine to sit with facilitation at trunk needed. Once able to set up and stabilize EOB with BUE support, pt maintains balance with supervision. Donned shoes with total assist for time management. NMR  during slideboard transfer with focus on postural control, balance, head/hips relationship and activation through BLE - pt able to perform with heavy mod to light max assist today with cues for hand placement and technique. Discussed trial of PWC today in later session and educated on the option for mobility and pressure relief.  Pt with abdominal binder and Tedhose today - BP in chair = 100/69 mmHg; HR = 104 bpm - asymptomatic  Therapy Documentation Precautions:  Precautions Precautions: Fall, Cervical Precaution/Restrictions Comments: decreased recall of precautions, not able to verbalize any when prompted Required Braces or Orthoses: Cervical Brace Cervical Brace: Soft collar Restrictions Weight Bearing Restrictions Per Provider Order: No    Pain: Reports pain is managed currently. Repositioned as needed    Therapy/Group: Individual Therapy  Gita Lamb [name], PT, DPT, CBIS  10/29/2023, 1:49 PM

## 2023-10-29 NOTE — Progress Notes (Signed)
 Occupational Therapy Session Note  Patient Details  Name: Christian Bailey MRN: [id number] Date of Birth: 1941/05/30  Today's Date: 10/29/2023 OT Individual Time: 1300-1355 OT Individual Time Calculation (min): 55 min    Short Term Goals: Week 1:  OT Short Term Goal 1 (Week 1): Pt will complete self feeding with Ae as necessary at Min A OT Short Term Goal 2 (Week 1): Pt will complete sit to stand with LRAD at Mod A in preparation for ADLs OT Short Term Goal 3 (Week 1): Pt will complete LB dressing at Max A with AE as necessary  Skilled Therapeutic Interventions/Progress Updates:    Pt resting in PWC upon arrival. SKilled OT intervention with focus on PWC mobility, pressure relief educaiton, and discharge planning to increase pt's independence with bADLs. PWC mobility in hallways and into gym with supervisoin. Pt required min A for mobility in room. Pt required min verbal cues/tactile cues for use of PWC controls. Reviewed pressure relief schedule and operation of PWC controls. Pt returned demonstrated x 2 and recalled pressure relief schedule. Pt inquired about functional prognosis. Discussed purpose of therapy while in hospital and further into future. Discussed possible home modifications. Discussed with PTA to provide pt with home measurement sheet to assess possibiity of PWC use at home. Pt retunred to room and reamined in PWC. Soft call bell provided. All needs within reach.   Therapy Documentation Precautions:  Precautions Precautions: Fall, Cervical Precaution/Restrictions Comments: decreased recall of precautions, not able to verbalize any when prompted Required Braces or Orthoses: Cervical Brace Cervical Brace: Soft collar Restrictions Weight Bearing Restrictions Per Provider Order: No Pain:  Pt denies pain this afternoon   Therapy/Group: Individual Therapy  [name] 10/29/2023, 2:46 PM

## 2023-10-29 NOTE — Progress Notes (Signed)
 PROGRESS NOTE   Subjective/Complaints:  Pt reports  multiple Bms- per nursing no dig stim given.  R heel bothering him- up a lot overnight and wasn't bothering him then, but slept a lot last night.    ROS:   Pt denies SOB, abd pain, CP, N/V/C/D, and vision changes   R heel is hurting him "for some reason"  Objective:   No results found.   No results for input(s): "WBC", "HGB", "HCT", "PLT" in the last 72 hours.   Recent Labs    10/28/23 0613  NA 140  K 3.8  CL 101  CO2 28  GLUCOSE 158*  BUN 53*  CREATININE 1.43*  CALCIUM  9.1     Intake/Output Summary (Last 24 hours) at 10/29/2023 0809 Last data filed at 10/29/2023 1610 Gross per 24 hour  Intake 738 ml  Output --  Net 738 ml     Pressure Injury 10/28/23 Sacrum Medial Stage 1 -  Intact skin with non-blanchable redness of a localized area usually over a bony prominence. STAGE 1 ON SACRUM (Active)  10/28/23 1333  Location: Sacrum  Location Orientation: Medial  Staging: Stage 1 -  Intact skin with non-blanchable redness of a localized area usually over a bony prominence.  Wound Description (Comments): STAGE 1 ON SACRUM  Present on Admission:      Pressure Injury 10/28/23 Heel Right Deep Tissue Pressure Injury - Purple or maroon localized area of discolored intact skin or blood-filled blister due to damage of underlying soft tissue from pressure and/or shear. DTI on bottom of heel (Active)  10/28/23 1820  Location: Heel  Location Orientation: Right  Staging: Deep Tissue Pressure Injury - Purple or maroon localized area of discolored intact skin or blood-filled blister due to damage of underlying soft tissue from pressure and/or shear.  Wound Description (Comments): DTI on bottom of heel  Present on Admission:     Physical Exam: Vital Signs Blood pressure (!) 139/48, pulse (!) 103, temperature (!) 97.4 F (36.3 C), temperature source Oral, resp. rate 18,  height 5\' 4"  (1.626 m), weight 60.6 kg, SpO2 100%.       General: awake, alert, appropriate, supine in bed; awake; NAD HENT: conjugate gaze; oropharynx moist CV: regular  rhythm, slightly tachycardic rate; no JVD Pulmonary: CTA B/L; no W/R/R- good air movement GI: soft, NT, ND, (+)BS- more normoactive Psychiatric: appropriate- interactive Neurological: Ox3 Skin: Heels covered with foam dressings B/L- R heel DTI and Sacral Stage I  Neurological: Ox3- MAS of 2-3 in LE's MSK: Ue's 4/5 B/L throughout LE's- 2-/5 B/L-in HF/KE- 4-/5 DF/PF but RLE weaker than LLE GU- foley in place PRIOR EXAMS: Spasticity- MAS of 1+ to 2 in Ue's this Am and 3 in RLE- MAS worse this AM- 2-3 throughout Neurologic: Cranial nerves II through XII intact, motor strength is 4/5 in bilateral  bicep, tricep, grip,and finger ext and hand intrinsics, 3- RIght delt and 4/5 left delt, 3- bilateral  hip flexor, 3- RIght and 4- left knee extensors, 4/5 bilateral ankle dorsiflexor and plantar flexor Sensory exam normal sensation to light touch and proprioception in bilateral upper and lower extremities Tone- 3+ a bilateral biceps triceps and BR, +  Hoffman's bilateral 2/5 RIght 3+ left knee Also MAS 3 tone in RIght Knee flexors and extensors, MAS 1 at the left knee Clonus at bilateral ankles  Musculoskeletal: Full range of motion in all 4 extremities. No joint swelling      Assessment/Plan: 1. Functional deficits which require 3+ hours per day of interdisciplinary therapy in a comprehensive inpatient rehab setting. Physiatrist is providing close team supervision and 24 hour management of active medical problems listed below. Physiatrist and rehab team continue to assess barriers to discharge/monitor patient progress toward functional and medical goals  Care Tool:  Bathing              Bathing assist Assist Level: Dependent - Patient 0%     Upper Body Dressing/Undressing Upper body dressing        Upper  body assist Assist Level: Moderate Assistance - Patient 50 - 74%    Lower Body Dressing/Undressing Lower body dressing            Lower body assist Assist for lower body dressing: Dependent - Patient 0%     Toileting Toileting    Toileting assist Assist for toileting: Dependent - Patient 0%     Transfers Chair/bed transfer  Transfers assist     Chair/bed transfer assist level: Maximal Assistance - Patient 25 - 49%     Locomotion Ambulation   Ambulation assist   Ambulation activity did not occur: Safety/medical concerns  Assist level: 2 helpers   Max distance: 5'   Walk 10 feet activity   Assist  Walk 10 feet activity did not occur: Safety/medical concerns        Walk 50 feet activity   Assist Walk 50 feet with 2 turns activity did not occur: Safety/medical concerns         Walk 150 feet activity   Assist Walk 150 feet activity did not occur: Safety/medical concerns         Walk 10 feet on uneven surface  activity   Assist Walk 10 feet on uneven surfaces activity did not occur: Safety/medical concerns         Wheelchair     Assist Is the patient using a wheelchair?: Yes Type of Wheelchair: Manual    Wheelchair assist level: Dependent - Patient 0% Max wheelchair distance: 150    Wheelchair 50 feet with 2 turns activity    Assist        Assist Level: Dependent - Patient 0%   Wheelchair 150 feet activity     Assist      Assist Level: Dependent - Patient 0%   Blood pressure (!) 139/48, pulse (!) 103, temperature (!) 97.4 F (36.3 C), temperature source Oral, resp. rate 18, height 5\' 4"  (1.626 m), weight 60.6 kg, SpO2 100%.  Medical Problem List and Plan: 1. Functional deficits secondary to Traumatic incomplete quadriplegia s/p C5-7 ACDF 10/12/23,  ASIA C/D- has R>L lower ext weakness as primary neurologic deficits             -patient may  shower             -ELOS/Goals: Sup 14-16d  D/c ~ 3 more  weeks  Con't CIR PT and OT  Will add low air loss mattress and prevalons for heels  2.  Antithrombotics: -DVT/anticoagulation:  Mechanical: Sequential compression devices, below knee Bilateral lower extremities 4/23- will see if Dr Ellery Guthrie will allow us  to start Lovenox - he said yes, so will start. 30mg  daily.             -  antiplatelet therapy: N/A 3. Pain Management: tylenol  prn. Voltaren  QID, lyrica  50mg  BID, flexeril  PRN -10/24/23 leg muscle soreness likely from PT/OT this week; will start muscle rub cream 4/28- still c/o muscle soreness in legs- con't pain regimen as needed 4/30- still sore, but don't want Flexeril  since likely caused confusion- con't tylenol  prn- can use muscle rub which is ordered 5/1- denies a lot of pain- mainly sore form spasticity/working out 4. Mood/Behavior/Sleep: LCSW to follow up for evaluation and support when appropriate --Delirium precautions--His mental status has cleared but will monitor for worsening with transfer to new unit .pt has not required prn meds for agitation in several days  Monitor sleep wake cycle             -antipsychotic agents: Change haldol  q 6 hrs prn to Seroquel  12.5mg  tid prn  -Restoril  7.5mg  nightly, cymbalta  20mg  daily 4/23-4/25 no delirium so far 4/28- having some delirium this AM? Not severe, but will monitor since has had in recent past 4/29- still confused this AM- but per nursing , was less confused overnight after Restoril  stopped- still pleasantly confused this AM when first woke up- didn't open eyes.  4/30-5/2 Aox3 this AM 5. Neuropsych/cognition: This patient is usually  capable of making decisions on his own behalf. 6. Skin/Wound Care: Routine pressure relief measures. 7. Fluids/Electrolytes/Nutrition:  Monitor I/O. Routine labs. Continue vitamins/supplements. 8. 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  -10/23/23 foley replaced yesterday by Dr. Willye Harvey, per note, leave in place for 7-10d, Consider resuming In-N-Out catheterization for management of his neurogenic bladder at that time. Recommend using a coud catheter for intermittent catheterization (when resumed).  4/28- will leave foley for 7-10 days 10.  Diabetes- A1c 8.0-  with peripheral neuropathy by hx only on Jardiance at home, use SSI in the hospital  -10/23/23 CBGs variable but likely d/t steroids; cont SSI for now  4/29- CBG's very labile in last 24 hours 99-383- - it concerns me to start Jardiance since of Foley and risk of UTI's- also for starting Tradjenta  since BG was 99 last night- don't want to drop BG too much  4/30- Will add Tradjenta  since BG's still running 150-242- will monitor closely for CBG hypoglycemia-   5/1-5/2 Cbgs look better 14--180- will con't regimen CBG (last 3)  Recent Labs    10/28/23 1708 10/28/23 2024 10/29/23 0530  GLUCAP 166* 180* 127*     11.  Spasticity affecting LEs> UE and R>L , aggressive ROM  with PT, consider oral meds  4/22- starting baclofen  5 mg TID- with his Cr 1.50- has been running 1.68 to 2.30  4/24- will not increase baclofen  due to Renal issues 12.  PAD s/p SFA and Iliac stenting, continue metoprolol   50mg  daily  13. CKD3b? With  AKI -4/22- Pt's Cr down to 1.50 from 2.29 2 days ago and 1.68 yesterday- will monitor 2x/week to see if will reduce further or be labile.   4/24- Cr 1.84- and BUN stable at 55- is drinking per pt  4/28- Cr 1.63- doing better  4/29- Cr 1.59 after 12+ hours of IVF's- and BUN very slightly down to 70 from 72-   5/1- Cr down to 1.43 and BUN down to 53 14. Questionable neurogenic bowel: continue miralax  32g daily, senokot s 2 tabs qAM 4/24- required suppository 2 days ago however had BM on bedpan yesterday- thinks is mild.   4/25- smear that was incontinent last night -10/23/23 LBM last night per pt was very hard still, but documented as loose-- doesn't like miralax  (on 32g daily)-- will add colace 200mg  daily, see if this helps, if continued loose stools then would stop the miralax  first. Can mix miralax  with half juice/half water.  -10/24/23 looser stools yesterday, will stop miralax , leave colace-- pt prefers this  4/28- LBM 2 days ago  4/29- LBM small liquid stool 4/28- and 1 medium stool as well-   4/30- LBM overnight  5/1- LBM this Am- was accident- might need bowel program  5/2- Placed on suppository nightly with possible dig stim- had multiple BM's during/after bowel program- if continues like this, will need dig stim.   15. HLD: crestor  20mg  nightly 16. Delirium/Confusion  4/29- yesterday given IVF's for this- checked U/A and Cx- which showed mod leuks, however rate bacteria and very little WBC's 6-10- which is not a UTI in most cases- will d/w pharmacy-   -pt still pleasantly confused this AM- but didn't even open eyes, was so sleepy, so hard to tease out what is due to sleepiness and what is actual delirium- per nurse, much better last night-   4/30- pt Ox3 this AM- very with it- likely flexeril  based on chart review- given for pain since on no pain meds- will keep off Flexeril  and monitor Sx's  5/2- Pt's Ox3 for past 3 days 17. Hx of HTN with severe hypotension  4/30- pt's BP dropped to  79 systolic yesterday when sitting with OT- not clear if had TEDS/ACE wraps- will reduce Metoprolol  XL to 12.5 mg daily- from 50 mg daily and see if that helps- don't want ot add midodrine  quite yet.  5/1- Adding midodrine  5 mg TID and adding abd binder- couldn't tolerate ACE wraps at all- just TED's-   5/2- BP yesterday was less symptomatic- but still dropped some- con't regimen 18. Atelectasis  4/30- sounds coarse- asked pt to use ICS regularly- is at bedside- also described "spitting things up"_ had "spit cup"- at bedside-  had barely anything in it.  19. R heel DTI and Stage I on sacrum  5/2- is new- ordered prevalon boots as well as low air loss mattress and needs to be turned q2 hours- placed order  I spent a total of  43  minutes on total care today- >50% coordination of care- due to  D/w nursing about pt; then nursing coordinator about DTI and stage I on sacrum- and review of chart secondary to BP/OH-   LOS: 11 days A FACE TO FACE EVALUATION WAS PERFORMED  Jorie Zee 10/29/2023, 8:09 AM

## 2023-10-29 NOTE — Consult Note (Signed)
 WOC Nurse Consult Note: Reason for Consult: DTI R heel  Wound type: Deep Tissue Pressure Injury R heel  Pressure Injury POA: no  Measurement: see nursing flowsheet  Wound bed: purple maroon discoloration skin intact posterior heel  Drainage (amount, consistency, odor) dry  Periwound: erythema  Dressing procedure/placement/frequency: Cleanse R heel with soap and water, dry and apply Xeroform gauze to wound bed daily, cover with dry gauze and silicone foam or Kerlix roll gauze whichever is preferred.  Place R foot in Prevalon boot when in bed to offload pressure Timm Foot [phone]  POC discussed with bedside nurse. WOC team will not follow RE-consult if further needs arise.   Thank you,    Ronni Colace MSN, RN-BC, [employer] [phone]

## 2023-10-29 NOTE — Progress Notes (Signed)
 Initial Nutrition Assessment  DOCUMENTATION CODES:   Severe malnutrition in context of chronic illness  INTERVENTION:   D/C Juven, patient dislikes and is not drinking. Change Ensure Max to Ensure Enlive po BID for more calories, each supplement provides 350 kcal and 20 grams of protein. Continue Prosource Plus 30 ml PO BID, each packet provides 100 kcal and 15 gm protein. MVI with minerals daily.  NUTRITION DIAGNOSIS:   Severe Malnutrition related to chronic illness (cervical myelopathy) as evidenced by severe muscle depletion, percent weight loss (15% weight loss x 6 months).  GOAL:   Patient will meet greater than or equal to 90% of their needs  MONITOR:   PO intake, Supplement acceptance, Skin  REASON FOR ASSESSMENT:   Consult Wound healing  ASSESSMENT:   83 yo male admitted with acute incomplete quadriplegia with R>L LE weakness d/t cervical myelopathy S/P C5-7 ACDF on 4/15. PMH includes osteoarthritis, HLD, anemia, HTN, DM-2, severe PAD, macular degeneration/retinal detachment, polyneuropathy, recurrent falls, GERD.  Patient states that he has been eating okay. He is a picky eater and only eats what he likes. Nutritional services ambassador is assisting patient with meal options. He does not like the Juven supplement, has not been drinking much of it. He does not like the Prosource Plus supplement, but agreed to take it once per day since the volume is so small. He has been drinking Ensure supplements about once daily; will change to Ensure Plus for additional calories. We discussed the importance of adequate nutrition to promote healing and recovery.   PTA, patient reports usual weight 160 lbs one year ago, now down to 142 lbs. No new weight taken since admission weight of 60.6 kg. Weight history reviewed. Patient with 15% weight loss over the past 6 months. Patient meets criteria for severe malnutrition, given severe depletion of muscle mass and significant weight  loss.  Labs reviewed.  CBG: [phone]  Medications reviewed and include dulcolax, colace, ferrous sulfate , novolog , tradjenta , juven, protonix , lyrica , senokot-s, flomax .  NUTRITION - FOCUSED PHYSICAL EXAM:  Flowsheet Row Most Recent Value  Orbital Region Moderate depletion  Upper Arm Region Mild depletion  Thoracic and Lumbar Region Mild depletion  Buccal Region Moderate depletion  Temple Region Moderate depletion  Clavicle Bone Region Unable to assess  Clavicle and Acromion Bone Region Unable to assess  Scapular Bone Region Moderate depletion  Dorsal Hand Moderate depletion  Patellar Region Unable to assess  Anterior Thigh Region Unable to assess  Posterior Calf Region Unable to assess  Edema (RD Assessment) Unable to assess  Hair Reviewed  Eyes Reviewed  Mouth Reviewed  Skin Reviewed  Nails Reviewed       Diet Order:   Diet Order             Diet regular Room service appropriate? Yes with Assist; Fluid consistency: Thin  Diet effective now                   EDUCATION NEEDS:   Education needs have been addressed  Skin:  Skin Assessment: Skin Integrity Issues: Skin Integrity Issues:: DTI, Stage I DTI: R heel Stage I: sacrum  Last BM:  5/2 type 7  Height:   Ht Readings from Last 1 Encounters:  10/18/23 5\' 4"  (1.626 m)    Weight:   Wt Readings from Last 1 Encounters:  10/18/23 60.6 kg    Ideal Body Weight:  59.1 kg  BMI:  Body mass index is 22.93 kg/m.  Estimated Nutritional Needs:  Kcal:  1700-1900  Protein:  85-95 gm  Fluid:  1.7-1.9 L   Barnet Boots RD, LDN, CNSC Contact via secure chat. If unavailable, use group chat "RD Inpatient."

## 2023-10-29 NOTE — Progress Notes (Addendum)
 Wound Plan    Wounds present:. 1.Pressure Injury 10/28/23 Sacrum Medial Stage 1 - Intact skin with non-blanchable redness of a localized area usually over a bony prominence. STAGE 1 ON SACRUM  Pressure Injury 11/16/23 Coccyx Right;Upper Stage 2 - Partial thickness loss of dermis presenting as a shallow open injury with a red, pink wound bed without slough.  2.Pressure Injury 10/28/23 Heel Right Deep Tissue Pressure Injury - Purple or maroon localized area of discolored intact skin or blood-filled blister due to damage of underlying soft tissue from pressure and/or shear. DTI on bottom of heel    Interventions:   Wound care plan initiated Prevalon boots Mattress Replacement Pressure redistribution chair  Dietician consult Boosting schedule (turn q 2 hours) Prosource daily in between meals Juven powder packet 2x daily in between meals Ensure max liquid 2x daily Patient on regular diet eating 25-75% 3 step skin care regime cleaner, moisturizer, skin protectant Gerharts cream for stage 1 -bottom Skin care orders WOC consult Cleanse R heel with soap and water, dry and apply Xeroform gauze to wound bed daily, cover with dry gauze and silicone foam or Kerlix roll gauze whichever is preferred.  Foam dressing to left heel Wedge in bed to help with turning  Wound care: place aquacel on wound bed butt area ; cover with foam  Braden Score: 15  Sensory: 3  Moisture: 3  Activity: 2  Mobility: 2  Nutrition: 3  Friction: 2  Contributors: Dr. Jonna Netter Love PAC Heather Bullins MSN, RN-BC, CWOCN  Calissa Swenor [place], RN-BC, [employer]

## 2023-10-29 NOTE — Progress Notes (Signed)
 Physical Therapy Session Note  Patient Details  Name: Christian Bailey MRN: [id number] Date of Birth: 10-06-1940  Today's Date: 10/29/2023 PT Individual Time: 1100-1210 and 1500-1540 PT Individual Time Calculation (min): 70 min and 40 min  Short Term Goals: Week 2:  PT Short Term Goal 1 (Week 2): Pt will be able to perform bed mobility with mod assist PT Short Term Goal 2 (Week 2): Pt will be able to perform bed <> w/c transfer with mod assist PT Short Term Goal 3 (Week 2): Pt will be able to tolerate standing for weightbearing x 5 min PT Short Term Goal 4 (Week 2): Pt will initiate PWC mobility  Skilled Therapeutic Interventions/Progress Updates: Pt presented in TIS agreeable to therapy. Pt denies pain at start of session. Nsg arrived to administer meds. Pt then transported to main gym. PTA obtained and discussed PWC with pt with pt agreeable to trial. PTA changed cushion in PWC to [place] due to stage 1. Performed Slide board transfer to high/low mat total A. Additional transfer completed to [facility] Low total A. PTA educated pt on PWC functions and importance of pressure relief and should be done every 20 min. Pt will require continued reinforcement of this. PTA then spent extended time adjusting arm/head rest for appropriate pt fit. Pt then spent the remainder of session navigating hallways through and 4C. Pt will require continued practice for turns and narrow spaces however demonstrated overall good safety awareness through hallways. Pt able to perform 180 degree turn in hallway without hitting objects. Pt navigated back to room and required minA for aligning next to bed. Pt left in PWC at end of session encouraging pt to perform pressure relief after lunch and verified that chair was powered down. Pt left in PWC with call bell within reach and needs met.   Tx2: Pt presented in PWC sleeping but easily aroused and agreeable to therapy, denies pain at start of session. Session focused on [facility] navigation.  Pt navigated PWC to atrium/[facility]CC entrance. Pt required minA for 0 degree turn in elevator however was able to safely navigate in/out of elevator with min cues - likely related to vision deficits. In larger spaces pt was able to navigate with minimal cues. Pt was also able to recall tilt feature when taking break in sun. Pt navigated back to room in same manner. In room PTA assisted with alignment of PWC to bed and set up of PWC as new style of bed unable to lower to adequate height therefore PWC needed to be elevated. Pt completed Slide board transfer with total A for set up and total A for transfer. Pt required maxA for repositioning and dependent x 2 for boosting to [facility]. Pt repositioned to comfort and left in bed with soft touch bell in place and current needs met.      Therapy Documentation Precautions:  Precautions Precautions: Fall, Cervical Precaution/Restrictions Comments: decreased recall of precautions, not able to verbalize any when prompted Required Braces or Orthoses: Cervical Brace Cervical Brace: Soft collar Restrictions Weight Bearing Restrictions Per Provider Order: No General:   Vital Signs: Therapy Vitals Temp: 97.7 F (36.5 C) Temp Source: Oral Pulse Rate: (!) 58 Resp: 18 BP: (!) 102/53 Patient Position (if appropriate): Sitting Oxygen Therapy SpO2: 100 % O2 Device: Room Air Pain:   Mobility:   Locomotion :    Trunk/Postural Assessment :    Balance:   Exercises:   Other Treatments:      Therapy/Group: Individual Therapy  Christian Bailey  10/29/2023, 4:46 PM

## 2023-10-30 DIAGNOSIS — K592 Neurogenic bowel, not elsewhere classified: Secondary | ICD-10-CM | POA: Diagnosis not present

## 2023-10-30 DIAGNOSIS — R739 Hyperglycemia, unspecified: Secondary | ICD-10-CM | POA: Diagnosis not present

## 2023-10-30 DIAGNOSIS — G8250 Quadriplegia, unspecified: Secondary | ICD-10-CM | POA: Diagnosis not present

## 2023-10-30 DIAGNOSIS — N319 Neuromuscular dysfunction of bladder, unspecified: Secondary | ICD-10-CM | POA: Diagnosis not present

## 2023-10-30 LAB — GLUCOSE, CAPILLARY
Glucose-Capillary: 131 mg/dL — ABNORMAL HIGH (ref 70–99)
Glucose-Capillary: 263 mg/dL — ABNORMAL HIGH (ref 70–99)
Glucose-Capillary: 219 mg/dL — ABNORMAL HIGH (ref 70–99)
Glucose-Capillary: 210 mg/dL — ABNORMAL HIGH (ref 70–99)

## 2023-10-30 NOTE — Progress Notes (Signed)
 PROGRESS NOTE   Subjective/Complaints:  Pt doing well today, slept well, pain well managed overall, LBM last night x1. Has foley in still, no issues with that. Denies any other complaints or concerns.    ROS:   Pt denies SOB, abd pain, CP, N/V/C/D, and vision changes   R heel is hurting him "for some reason"-- doesn't mention this today.   Objective:   No results found.   No results for input(s): "WBC", "HGB", "HCT", "PLT" in the last 72 hours.   Recent Labs    10/28/23 0613  NA 140  K 3.8  CL 101  CO2 28  GLUCOSE 158*  BUN 53*  CREATININE 1.43*  CALCIUM  9.1     Intake/Output Summary (Last 24 hours) at 10/30/2023 1051 Last data filed at 10/30/2023 3329 Gross per 24 hour  Intake 438 ml  Output 1900 ml  Net -1462 ml     Pressure Injury 10/28/23 Sacrum Medial Stage 1 -  Intact skin with non-blanchable redness of a localized area usually over a bony prominence. STAGE 1 ON SACRUM (Active)  10/28/23 1333  Location: Sacrum  Location Orientation: Medial  Staging: Stage 1 -  Intact skin with non-blanchable redness of a localized area usually over a bony prominence.  Wound Description (Comments): STAGE 1 ON SACRUM  Present on Admission:      Pressure Injury 10/28/23 Heel Right Deep Tissue Pressure Injury - Purple or maroon localized area of discolored intact skin or blood-filled blister due to damage of underlying soft tissue from pressure and/or shear. DTI on bottom of heel (Active)  10/28/23 1820  Location: Heel  Location Orientation: Right  Staging: Deep Tissue Pressure Injury - Purple or maroon localized area of discolored intact skin or blood-filled blister due to damage of underlying soft tissue from pressure and/or shear.  Wound Description (Comments): DTI on bottom of heel  Present on Admission:     Physical Exam: Vital Signs Blood pressure (!) 106/40, pulse (!) 108, temperature 98.5 F (36.9 C),  temperature source Oral, resp. rate 18, height 5\' 4"  (1.626 m), weight 60.6 kg, SpO2 94%.   General: awake, alert, appropriate, supine in bed; awake; NAD HENT: conjugate gaze; oropharynx moist, soft collar in place.  CV: regular  rhythm, slightly tachycardic rate; no JVD Pulmonary: CTA B/L; no W/R/R- good air movement GI: soft, NT, ND, (+)BS- more normoactive Psychiatric: appropriate- interactive Neurological: Ox3 Skin: Heels covered with foam dressings B/L- R heel DTI and Sacral Stage I GU- foley in place, clear yellow urine  PRIOR EXAMS: Neurological: Ox3- MAS of 2-3 in LE's MSK: Ue's 4/5 B/L throughout LE's- 2-/5 B/L-in HF/KE- 4-/5 DF/PF but RLE weaker than LLE   Spasticity- MAS of 1+ to 2 in Ue's this Am and 3 in RLE- MAS worse this AM- 2-3 throughout Neurologic: Cranial nerves II through XII intact, motor strength is 4/5 in bilateral  bicep, tricep, grip,and finger ext and hand intrinsics, 3- RIght delt and 4/5 left delt, 3- bilateral  hip flexor, 3- RIght and 4- left knee extensors, 4/5 bilateral ankle dorsiflexor and plantar flexor Sensory exam normal sensation to light touch and proprioception in bilateral upper and lower extremities Tone-  3+ a bilateral biceps triceps and BR, + Hoffman's bilateral 2/5 RIght 3+ left knee Also MAS 3 tone in RIght Knee flexors and extensors, MAS 1 at the left knee Clonus at bilateral ankles  Musculoskeletal: Full range of motion in all 4 extremities. No joint swelling      Assessment/Plan: 1. Functional deficits which require 3+ hours per day of interdisciplinary therapy in a comprehensive inpatient rehab setting. Physiatrist is providing close team supervision and 24 hour management of active medical problems listed below. Physiatrist and rehab team continue to assess barriers to discharge/monitor patient progress toward functional and medical goals  Care Tool:  Bathing              Bathing assist Assist Level: Dependent - Patient  0%     Upper Body Dressing/Undressing Upper body dressing        Upper body assist Assist Level: Moderate Assistance - Patient 50 - 74%    Lower Body Dressing/Undressing Lower body dressing            Lower body assist Assist for lower body dressing: Dependent - Patient 0%     Toileting Toileting    Toileting assist Assist for toileting: Dependent - Patient 0%     Transfers Chair/bed transfer  Transfers assist     Chair/bed transfer assist level: Maximal Assistance - Patient 25 - 49%     Locomotion Ambulation   Ambulation assist   Ambulation activity did not occur: Safety/medical concerns  Assist level: 2 helpers   Max distance: 5'   Walk 10 feet activity   Assist  Walk 10 feet activity did not occur: Safety/medical concerns        Walk 50 feet activity   Assist Walk 50 feet with 2 turns activity did not occur: Safety/medical concerns         Walk 150 feet activity   Assist Walk 150 feet activity did not occur: Safety/medical concerns         Walk 10 feet on uneven surface  activity   Assist Walk 10 feet on uneven surfaces activity did not occur: Safety/medical concerns         Wheelchair     Assist Is the patient using a wheelchair?: Yes Type of Wheelchair: Manual    Wheelchair assist level: Dependent - Patient 0% Max wheelchair distance: 150    Wheelchair 50 feet with 2 turns activity    Assist        Assist Level: Dependent - Patient 0%   Wheelchair 150 feet activity     Assist      Assist Level: Dependent - Patient 0%   Blood pressure (!) 106/40, pulse (!) 108, temperature 98.5 F (36.9 C), temperature source Oral, resp. rate 18, height 5\' 4"  (1.626 m), weight 60.6 kg, SpO2 94%.  Medical Problem List and Plan: 1. 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  Antithrombotics: -DVT/anticoagulation:  Mechanical: Sequential compression devices, below knee Bilateral lower extremities 4/23- will see if Dr Ellery Guthrie will allow us  to start Lovenox - he said yes, so will  start. 30mg  daily.             -antiplatelet therapy: N/A 3. Pain Management: tylenol  prn. Voltaren  QID, lyrica  50mg  BID, flexeril  PRN -10/24/23 leg muscle soreness likely from PT/OT this week; will start muscle rub cream 4/28- still c/o muscle soreness in legs- con't pain regimen as needed 4/30- still sore, but don't want Flexeril  since likely caused confusion- con't tylenol  prn- can use muscle rub which is ordered 5/1- denies a lot of pain- mainly sore form spasticity/working out 4. Mood/Behavior/Sleep: LCSW to follow up for evaluation and support when appropriate --Delirium precautions--His mental status has cleared but will monitor for worsening with transfer to new unit .pt has not required prn meds for agitation in several days  Monitor sleep wake cycle             -antipsychotic agents: Change haldol  q 6 hrs prn to Seroquel  12.5mg  tid prn  -Restoril  7.5mg  nightly, cymbalta  20mg  daily 4/23-4/25 no delirium so far 4/28- having some delirium this AM? Not severe, but will monitor since has had in recent past 4/29- still confused this AM- but per nursing , was less confused overnight after Restoril  stopped- still pleasantly confused this AM when first woke up- didn't open eyes.  4/30-5/3 Aox3 this AM 5. Neuropsych/cognition: This patient is usually  capable of making decisions on his own behalf. 6. Skin/Wound Care: Routine pressure relief measures. 7. Fluids/Electrolytes/Nutrition:  Monitor I/O. Routine labs. Continue vitamins/supplements. 8. Right frozen shoulder following Right reverse shoulder arthroplasty, do not expect any significant  improvement with therapy will need to compensate  9.  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  -10/23/23 foley replaced yesterday by Dr. Willye Harvey, per note, leave in place for 7-10d, Consider resuming In-N-Out catheterization for management of his neurogenic bladder at that time. Recommend using a coud catheter for intermittent catheterization (when resumed).  4/28- will leave foley for 7-10 days 10.  Diabetes- A1c 8.0-  with peripheral neuropathy by hx only on Jardiance at home, use SSI in the hospital  -10/23/23 CBGs variable but likely d/t steroids; cont SSI for now 4/29- CBG's very labile in last 24 hours 99-383- - it concerns me to start Jardiance since of Foley and risk of UTI's- also for starting Tradjenta  since BG was 99 last night- don't want to drop BG too much 4/30- Will add Tradjenta  since BG's still running 150-242- will monitor closely for CBG hypoglycemia-   10/30/23 CBGs looking great! CBG (last 3)  Recent Labs    10/29/23 1722 10/29/23 2103 10/30/23 0621  GLUCAP 155* 179* 131*     11.  Spasticity affecting LEs> UE and R>L , aggressive ROM  with PT, consider oral meds  4/22- starting baclofen  5 mg TID- with his Cr 1.50- has been running 1.68 to 2.30  4/24- will not increase baclofen  due to Renal issues 12.  PAD s/p SFA and Iliac stenting, continue metoprolol   50mg  daily  13. CKD3b? With AKI -4/22- Pt's Cr down to 1.50 from 2.29 2 days ago and 1.68 yesterday- will monitor 2x/week to see if will reduce further or be labile.   4/24- Cr 1.84- and BUN stable at 55- is drinking per pt  4/28- Cr 1.63- doing better 4/29- Cr 1.59 after 12+ hours of IVF's- and BUN very slightly down to 70 from 72-   5/1- Cr down to 1.43 and BUN down to 53 14. Questionable neurogenic bowel: continue miralax  32g daily, senokot s 2 tabs qAM 4/24- required suppository 2 days ago however had BM on bedpan yesterday- thinks is mild.   4/25- smear that was incontinent last night -10/23/23 LBM last night per pt was very hard still, but documented as loose-- doesn't like miralax  (on 32g daily)-- will add colace 200mg  daily, see if this helps, if continued loose stools then would stop the miralax  first. Can mix miralax  with half juice/half water.  -10/24/23 looser stools yesterday, will stop miralax , leave colace-- pt prefers this  4/28- LBM 2 days ago  4/29- LBM small liquid stool 4/28- and 1 medium stool as well-   4/30- LBM overnight  5/1- LBM this Am- was accident- might need bowel program 5/2- Placed on suppository nightly with possible dig stim- had multiple BM's during/after bowel program- if continues like this, will need dig stim.   -10/30/23 LBM last night 15. HLD: crestor  20mg  nightly 16. Delirium/Confusion 4/29- yesterday given IVF's for this- checked U/A and Cx- which showed mod leuks, however rate bacteria and very little WBC's 6-10- which is not a UTI in most cases- will d/w pharmacy-  -pt still pleasantly confused this AM- but didn't even open eyes, was so sleepy, so hard to tease out what is due to sleepiness and what is actual delirium- per nurse, much better last night-  4/30- pt Ox3 this AM- very with it- likely flexeril  based on chart review- given for pain since on no pain meds- will keep off Flexeril  and monitor Sx's  5/2- Pt's Ox3 for past 3 days  17. Hx of HTN  with severe hypotension 4/30- pt's BP dropped to 79 systolic yesterday when sitting with OT- not clear if had TEDS/ACE wraps- will reduce Metoprolol  XL to 12.5 mg daily- from 50 mg daily and see if that helps- don't want ot add midodrine  quite yet.  5/1- Adding midodrine  5 mg TID and adding abd binder- couldn't tolerate ACE wraps at all- just TED's-   5/2- BP yesterday was less symptomatic- but still dropped some- con't regimen -10/30/23 BPs a bit better recently; monitor Vitals:   10/26/23 1939 10/27/23 0552 10/27/23 1449 10/27/23 1943  BP: 123/63 (!) 119/58 (!) 143/76 111/62   10/28/23 1712 10/28/23 1933 10/29/23 0400 10/29/23 1407  BP: 126/64 (!) 123/55 (!) 139/48 (!) 102/53   10/29/23 1727 10/29/23 2012 10/30/23 0625 10/30/23 1025  BP: 134/71 (!) 133/56 (!) 113/52 (!) 106/40     18. Atelectasis 4/30- sounds coarse- asked pt to use ICS regularly- is at bedside- also described "spitting things up"_ had "spit cup"- at bedside-  had barely anything in it.  19. R heel DTI and Stage I on sacrum 5/2- is new- ordered prevalon boots as well as low air loss mattress and needs to be turned q2 hours- placed order    LOS: 12 days A FACE TO FACE EVALUATION WAS PERFORMED  [id number]ash[address] Macias 10/30/2023, 10:51 AM

## 2023-10-30 NOTE — Plan of Care (Signed)
  Problem: Consults Goal: RH GENERAL PATIENT EDUCATION Description: See Patient Education module for education specifics. Outcome: Progressing   Problem: RH BOWEL ELIMINATION Goal: RH STG MANAGE BOWEL WITH ASSISTANCE Description: STG Manage Bowel with mod I  Assistance. Outcome: Progressing Goal: RH STG MANAGE BOWEL W/MEDICATION W/ASSISTANCE Description: STG Manage Bowel with Medication with mod I Assistance. Outcome: Progressing   Problem: RH BLADDER ELIMINATION Goal: RH STG MANAGE BLADDER WITH ASSISTANCE Description: STG Manage Bladder With toileting / mod I Assistance Outcome: Progressing Goal: RH STG MANAGE BLADDER WITH MEDICATION WITH ASSISTANCE Description: STG Manage Bladder With Medication With mod I Assistance. Outcome: Progressing   Problem: RH SAFETY Goal: RH STG ADHERE TO SAFETY PRECAUTIONS W/ASSISTANCE/DEVICE Description: STG Adhere to Safety Precautions With cues Assistance/Device. Outcome: Progressing   Problem: RH PAIN MANAGEMENT Goal: RH STG PAIN MANAGED AT OR BELOW PT'S PAIN GOAL Description: < 4 with prns Outcome: Progressing   Problem: RH KNOWLEDGE DEFICIT GENERAL Goal: RH STG INCREASE KNOWLEDGE OF SELF CARE AFTER HOSPITALIZATION Description: Patient and wife will be able to manage care at discharge using educational resources for medication, skin care,dietary modification and fall prevention independently Outcome: Progressing   Problem: Education: Goal: Ability to describe self-care measures that may prevent or decrease complications (Diabetes Survival Skills Education) will improve Outcome: Progressing Goal: Individualized Educational Video(s) Outcome: Progressing   Problem: Coping: Goal: Ability to adjust to condition or change in health will improve Outcome: Progressing   Problem: Fluid Volume: Goal: Ability to maintain a balanced intake and output will improve Outcome: Progressing   Problem: Health Behavior/Discharge Planning: Goal: Ability  to identify and utilize available resources and services will improve Outcome: Progressing Goal: Ability to manage health-related needs will improve Outcome: Progressing   Problem: Metabolic: Goal: Ability to maintain appropriate glucose levels will improve Outcome: Progressing   Problem: Nutritional: Goal: Maintenance of adequate nutrition will improve Outcome: Progressing Goal: Progress toward achieving an optimal weight will improve Outcome: Progressing   Problem: Skin Integrity: Goal: Risk for impaired skin integrity will decrease Outcome: Progressing   Problem: Tissue Perfusion: Goal: Adequacy of tissue perfusion will improve Outcome: Progressing

## 2023-10-31 DIAGNOSIS — K592 Neurogenic bowel, not elsewhere classified: Secondary | ICD-10-CM | POA: Diagnosis not present

## 2023-10-31 DIAGNOSIS — N319 Neuromuscular dysfunction of bladder, unspecified: Secondary | ICD-10-CM | POA: Diagnosis not present

## 2023-10-31 DIAGNOSIS — G8250 Quadriplegia, unspecified: Secondary | ICD-10-CM | POA: Diagnosis not present

## 2023-10-31 DIAGNOSIS — R739 Hyperglycemia, unspecified: Secondary | ICD-10-CM | POA: Diagnosis not present

## 2023-10-31 LAB — GLUCOSE, CAPILLARY
Glucose-Capillary: 157 mg/dL — ABNORMAL HIGH (ref 70–99)
Glucose-Capillary: 277 mg/dL — ABNORMAL HIGH (ref 70–99)
Glucose-Capillary: 145 mg/dL — ABNORMAL HIGH (ref 70–99)
Glucose-Capillary: 199 mg/dL — ABNORMAL HIGH (ref 70–99)

## 2023-10-31 NOTE — Progress Notes (Signed)
 PROGRESS NOTE   Subjective/Complaints:  Pt doing well again today, slept well, pain well controlled overall, LBM last night x2. Has foley in still, no issues with that. Denies any other complaints or concerns.    ROS:   Pt denies SOB, abd pain, CP, N/V/C/D, and vision changes   R heel is hurting him "for some reason"-- doesn't mention this today.   Objective:   No results found.   No results for input(s): "WBC", "HGB", "HCT", "PLT" in the last 72 hours.   No results for input(s): "NA", "K", "CL", "CO2", "GLUCOSE", "BUN", "CREATININE", "CALCIUM " in the last 72 hours.    Intake/Output Summary (Last 24 hours) at 10/31/2023 0758 Last data filed at 10/31/2023 0359 Gross per 24 hour  Intake 600 ml  Output 1300 ml  Net -700 ml     Pressure Injury 10/28/23 Sacrum Medial Stage 1 -  Intact skin with non-blanchable redness of a localized area usually over a bony prominence. STAGE 1 ON SACRUM (Active)  10/28/23 1333  Location: Sacrum  Location Orientation: Medial  Staging: Stage 1 -  Intact skin with non-blanchable redness of a localized area usually over a bony prominence.  Wound Description (Comments): STAGE 1 ON SACRUM  Present on Admission:      Pressure Injury 10/28/23 Heel Right Deep Tissue Pressure Injury - Purple or maroon localized area of discolored intact skin or blood-filled blister due to damage of underlying soft tissue from pressure and/or shear. DTI on bottom of heel (Active)  10/28/23 1820  Location: Heel  Location Orientation: Right  Staging: Deep Tissue Pressure Injury - Purple or maroon localized area of discolored intact skin or blood-filled blister due to damage of underlying soft tissue from pressure and/or shear.  Wound Description (Comments): DTI on bottom of heel  Present on Admission:     Physical Exam: Vital Signs Blood pressure (!) 140/62, pulse 98, temperature 98.3 F (36.8 C), temperature  source Oral, resp. rate 18, height 5\' 4"  (1.626 m), weight 60.6 kg, SpO2 94%.   General: awake, alert, appropriate, supine in bed; awake; NAD HENT: conjugate gaze; oropharynx moist, soft collar in place.  CV: regular  rhythm, reg rate today; no JVD Pulmonary: CTA B/L; no W/R/R- good air movement GI: soft, NT, ND, (+)BS- more normoactive Psychiatric: appropriate- interactive Neurological: Ox3 Skin: Heels covered with foam dressings B/L- R heel DTI and Sacral Stage I GU- foley in place, clear yellow urine  PRIOR EXAMS: Neurological: Ox3- MAS of 2-3 in LE's MSK: Ue's 4/5 B/L throughout LE's- 2-/5 B/L-in HF/KE- 4-/5 DF/PF but RLE weaker than LLE   Spasticity- MAS of 1+ to 2 in Ue's this Am and 3 in RLE- MAS worse this AM- 2-3 throughout Neurologic: Cranial nerves II through XII intact, motor strength is 4/5 in bilateral  bicep, tricep, grip,and finger ext and hand intrinsics, 3- RIght delt and 4/5 left delt, 3- bilateral  hip flexor, 3- RIght and 4- left knee extensors, 4/5 bilateral ankle dorsiflexor and plantar flexor Sensory exam normal sensation to light touch and proprioception in bilateral upper and lower extremities Tone- 3+ a bilateral biceps triceps and BR, + Hoffman's bilateral 2/5 RIght 3+ left knee  Also MAS 3 tone in RIght Knee flexors and extensors, MAS 1 at the left knee Clonus at bilateral ankles  Musculoskeletal: Full range of motion in all 4 extremities. No joint swelling      Assessment/Plan: 1. Functional deficits which require 3+ hours per day of interdisciplinary therapy in a comprehensive inpatient rehab setting. Physiatrist is providing close team supervision and 24 hour management of active medical problems listed below. Physiatrist and rehab team continue to assess barriers to discharge/monitor patient progress toward functional and medical goals  Care Tool:  Bathing              Bathing assist Assist Level: Dependent - Patient 0%     Upper Body  Dressing/Undressing Upper body dressing        Upper body assist Assist Level: Moderate Assistance - Patient 50 - 74%    Lower Body Dressing/Undressing Lower body dressing            Lower body assist Assist for lower body dressing: Dependent - Patient 0%     Toileting Toileting    Toileting assist Assist for toileting: Dependent - Patient 0%     Transfers Chair/bed transfer  Transfers assist     Chair/bed transfer assist level: Maximal Assistance - Patient 25 - 49%     Locomotion Ambulation   Ambulation assist   Ambulation activity did not occur: Safety/medical concerns  Assist level: 2 helpers   Max distance: 5'   Walk 10 feet activity   Assist  Walk 10 feet activity did not occur: Safety/medical concerns        Walk 50 feet activity   Assist Walk 50 feet with 2 turns activity did not occur: Safety/medical concerns         Walk 150 feet activity   Assist Walk 150 feet activity did not occur: Safety/medical concerns         Walk 10 feet on uneven surface  activity   Assist Walk 10 feet on uneven surfaces activity did not occur: Safety/medical concerns         Wheelchair     Assist Is the patient using a wheelchair?: Yes Type of Wheelchair: Manual    Wheelchair assist level: Dependent - Patient 0% Max wheelchair distance: 150    Wheelchair 50 feet with 2 turns activity    Assist        Assist Level: Dependent - Patient 0%   Wheelchair 150 feet activity     Assist      Assist Level: Dependent - Patient 0%   Blood pressure (!) 140/62, pulse 98, temperature 98.3 F (36.8 C), temperature source Oral, resp. rate 18, height 5\' 4"  (1.626 m), weight 60.6 kg, SpO2 94%.  Medical Problem List and Plan: 1. 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  Antithrombotics: -DVT/anticoagulation:  Mechanical: Sequential compression devices, below knee Bilateral lower extremities 4/23- will see if Dr Ellery Guthrie will allow us  to start Lovenox - he said yes, so will start. 30mg  daily.             -antiplatelet  therapy: N/A 3. Pain Management: tylenol  prn. Voltaren  QID, lyrica  50mg  BID, flexeril  PRN -10/24/23 leg muscle soreness likely from PT/OT this week; will start muscle rub cream 4/28- still c/o muscle soreness in legs- con't pain regimen as needed 4/30- still sore, but don't want Flexeril  since likely caused confusion- con't tylenol  prn- can use muscle rub which is ordered 5/1- denies a lot of pain- mainly sore form spasticity/working out 4. Mood/Behavior/Sleep: LCSW to follow up for evaluation and support when appropriate --Delirium precautions--His mental status has cleared but will monitor for worsening with transfer to new unit .pt has not required prn meds for agitation in several days  Monitor sleep wake cycle             -antipsychotic agents: Change haldol  q 6 hrs prn to Seroquel  12.5mg  tid prn  -Restoril  7.5mg  nightly, cymbalta  20mg  daily 4/23-4/25 no delirium so far 4/28- having some delirium this AM? Not severe, but will monitor since has had in recent past 4/29- still confused this AM- but per nursing , was less confused overnight after Restoril  stopped- still pleasantly confused this AM when first woke up- didn't open eyes.  4/30-5/3 Aox3 this AM 5. Neuropsych/cognition: This patient is usually  capable of making decisions on his own behalf. 6. Skin/Wound Care: Routine pressure relief measures. 7. Fluids/Electrolytes/Nutrition:  Monitor I/O. Routine labs. Continue vitamins/supplements. 8. Right frozen shoulder following Right reverse shoulder arthroplasty, do not expect any significant improvement with therapy  will need to compensate  9.  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  -10/23/23 foley replaced yesterday by Dr. Willye Harvey, per note, leave in place for 7-10d, Consider resuming In-N-Out catheterization for management of his neurogenic bladder at that time. Recommend using a coud catheter for intermittent catheterization (when resumed).  4/28- will leave foley for 7-10 days 10/31/23 foley likely ready to come out tomorrow? Placed 4/25 10.  Diabetes- A1c 8.0-  with peripheral neuropathy by hx only on Jardiance at home, use SSI in the hospital  -10/23/23 CBGs variable but likely d/t steroids; cont SSI for now 4/29- CBG's very labile in last 24 hours 99-383- - it concerns me to start Jardiance since of Foley and risk of UTI's- also for starting Tradjenta  since BG was 99 last night- don't want to drop BG too much 4/30- Will add Tradjenta  since BG's still running 150-242- will monitor closely for CBG hypoglycemia-   10/30/23 CBGs looking great!  -10/31/23 CBGs a little higher the last day, but monitor for trend CBG (last 3)  Recent Labs    10/30/23 1622 10/30/23 2039 10/31/23 0551  GLUCAP 219* 210* 157*     11.  Spasticity affecting LEs> UE and R>L , aggressive ROM  with PT, consider oral meds  4/22- starting baclofen  5 mg TID- with his Cr 1.50- has been running 1.68 to 2.30  4/24- will  not increase baclofen  due to Renal issues 12.  PAD s/p SFA and Iliac stenting, continue metoprolol  50mg  daily  13. CKD3b? With AKI -4/22- Pt's Cr down to 1.50 from 2.29 2 days ago and 1.68 yesterday- will monitor 2x/week to see if will reduce further or be labile.   4/24- Cr 1.84- and BUN stable at 55- is drinking per pt  4/28- Cr 1.63- doing better 4/29- Cr 1.59 after 12+ hours of IVF's- and BUN very slightly down to 70 from 72-   5/1- Cr down to 1.43 and BUN down to 53 14. Questionable neurogenic bowel: continue miralax  32g daily, senokot s 2 tabs qAM 4/24- required suppository 2 days ago however had BM on bedpan yesterday- thinks is mild.   4/25- smear that was incontinent last night -10/23/23 LBM last night per pt was very hard still, but documented as loose-- doesn't like miralax  (on 32g daily)-- will add colace 200mg  daily, see if this helps, if continued loose stools then would stop the miralax  first. Can mix miralax  with half juice/half water.  -10/24/23 looser stools yesterday, will stop miralax , leave colace-- pt prefers this  4/28- LBM 2 days ago  4/29- LBM small liquid stool 4/28- and 1 medium stool as well-   4/30- LBM overnight  5/1- LBM this Am- was accident- might need bowel program 5/2- Placed on suppository nightly with possible dig stim- had multiple BM's during/after bowel program- if continues like this, will need dig stim.   -10/31/23 LBM last night x2 15. HLD: crestor  20mg  nightly 16. Delirium/Confusion 4/29- yesterday given IVF's for this- checked U/A and Cx- which showed mod leuks, however rate bacteria and very little WBC's 6-10- which is not a UTI in most cases- will d/w pharmacy-  -pt still pleasantly confused this AM- but didn't even open eyes, was so sleepy, so hard to tease out what is due to sleepiness and what is actual delirium- per nurse, much better last night-  4/30- pt Ox3 this AM- very with it- likely flexeril  based on chart review- given for pain since on no  pain meds- will keep off Flexeril  and monitor Sx's  5/2- Pt's Ox3 for past 3 days  17. Hx of HTN with severe hypotension 4/30- pt's BP dropped to 79 systolic yesterday when sitting with OT- not clear if had TEDS/ACE wraps- will reduce Metoprolol  XL to 12.5 mg daily- from 50 mg daily and see if that helps- don't want ot add midodrine  quite yet.  5/1- Adding midodrine  5 mg TID and adding abd binder- couldn't tolerate ACE wraps at all- just TED's-   5/2- BP yesterday was less symptomatic- but still dropped some- con't regimen -5/3-4/25 BPs a bit better recently; monitor Vitals:   10/27/23 1943 10/28/23 1712 10/28/23 1933 10/29/23 0400  BP: 111/62 126/64 (!) 123/55 (!) 139/48   10/29/23 1407 10/29/23 1727 10/29/23 2012 10/30/23 0625  BP: (!) 102/53 134/71 (!) 133/56 (!) 113/52   10/30/23 1025 10/30/23 1336 10/30/23 1806 10/31/23 0359  BP: (!) 106/40 (!) 115/44 (!) 133/57 (!) 140/62     18. Atelectasis 4/30- sounds coarse- asked pt to use ICS regularly- is at bedside- also described "spitting things up"_ had "spit cup"- at bedside-  had barely anything in it.  19. R heel DTI and Stage I on sacrum 5/2- is new- ordered prevalon boots as well as low air loss mattress and needs to be turned q2 hours- placed order    LOS: 13 days A FACE TO FACE EVALUATION  WAS PERFORMED  [address] 10/31/2023, 7:58 AM

## 2023-11-01 DIAGNOSIS — E43 Unspecified severe protein-calorie malnutrition: Secondary | ICD-10-CM | POA: Insufficient documentation

## 2023-11-01 DIAGNOSIS — G959 Disease of spinal cord, unspecified: Secondary | ICD-10-CM | POA: Diagnosis not present

## 2023-11-01 LAB — CBC WITH DIFFERENTIAL/PLATELET
Abs Immature Granulocytes: 0.04 10*3/uL (ref 0.00–0.07)
Basophils Absolute: 0.1 10*3/uL (ref 0.0–0.1)
Basophils Relative: 1 %
Eosinophils Absolute: 0.5 10*3/uL (ref 0.0–0.5)
Eosinophils Relative: 7 %
HCT: 26.4 % — ABNORMAL LOW (ref 39.0–52.0)
Hemoglobin: 8.4 g/dL — ABNORMAL LOW (ref 13.0–17.0)
Immature Granulocytes: 1 %
Lymphocytes Relative: 14 %
Lymphs Abs: 0.9 10*3/uL (ref 0.7–4.0)
MCH: 29.2 pg (ref 26.0–34.0)
MCHC: 31.8 g/dL (ref 30.0–36.0)
MCV: 91.7 fL (ref 80.0–100.0)
Monocytes Absolute: 0.5 10*3/uL (ref 0.1–1.0)
Monocytes Relative: 8 %
Neutro Abs: 4.4 10*3/uL (ref 1.7–7.7)
Neutrophils Relative %: 69 %
Platelets: 222 10*3/uL (ref 150–400)
RBC: 2.88 MIL/uL — ABNORMAL LOW (ref 4.22–5.81)
RDW: 15.1 % (ref 11.5–15.5)
WBC: 6.4 10*3/uL (ref 4.0–10.5)
nRBC: 0 % (ref 0.0–0.2)

## 2023-11-01 LAB — BASIC METABOLIC PANEL WITH GFR
Anion gap: 7 (ref 5–15)
BUN: 37 mg/dL — ABNORMAL HIGH (ref 8–23)
CO2: 31 mmol/L (ref 22–32)
Calcium: 8.4 mg/dL — ABNORMAL LOW (ref 8.9–10.3)
Chloride: 101 mmol/L (ref 98–111)
Creatinine, Ser: 1.51 mg/dL — ABNORMAL HIGH (ref 0.61–1.24)
GFR, Estimated: 46 mL/min — ABNORMAL LOW (ref >60)
Glucose, Bld: 192 mg/dL — ABNORMAL HIGH (ref 70–99)
Potassium: 3.8 mmol/L (ref 3.5–5.1)
Sodium: 139 mmol/L (ref 135–145)

## 2023-11-01 LAB — GLUCOSE, CAPILLARY
Glucose-Capillary: 185 mg/dL — ABNORMAL HIGH (ref 70–99)
Glucose-Capillary: 217 mg/dL — ABNORMAL HIGH (ref 70–99)
Glucose-Capillary: 135 mg/dL — ABNORMAL HIGH (ref 70–99)
Glucose-Capillary: 189 mg/dL — ABNORMAL HIGH (ref 70–99)

## 2023-11-01 NOTE — Progress Notes (Addendum)
 Occupational Therapy Session Note  Patient Details  Name: Christian Bailey MRN: [id number] Date of Birth: Sep 28, 1940  Today's Date: 11/01/2023 OT Individual Time: 5284-1324 OT Individual Time Calculation (min): 80 min    Short Term Goals: Week 2:  OT Short Term Goal 1 (Week 2): Pt will complete static sitting EOB at Mod A in preparation for ADLs OT Short Term Goal 2 (Week 2): Pt will complete UB dressing with Max A OT Short Term Goal 3 (Week 2): Pt will complete self feeding with set up  Skilled Therapeutic Interventions/Progress Updates:      Therapy Documentation Precautions:  Precautions Precautions: Fall, Cervical Precaution/Restrictions Comments: decreased recall of precautions, not able to verbalize any when prompted Required Braces or Orthoses: Cervical Brace Cervical Brace: Soft collar Restrictions Weight Bearing Restrictions Per Provider Order: No General: "Nice to see you again!" Pt seated in W/C upon OT arrival, agreeable to OT. Pt completing PWC mobility throughout unit at SBA in order to increase independence with functional mobility.   Pain: no pain reported  ADL: OT providing skilled intervention on transfer training, Pt completed W/C>EOB lateral transfer at Max A +1 with transfer board, pt then completed bed mobility at Mod A, able to control trunk decent with bed rail with OT providing assistance with BLE.    Balance: Pt completed a variety of balance/core strength activities in order to promote increased balance strategies with ADL participation. Pt completed all activities at sitting level with intermittent unsupported sitting. Pt completed exercises listed below: -hitting ball with 2# dowel rod 3x20  -anterior/side-to-side reaching with BUE to retrieve bean bags to toss, occasional grading of task d/t shoulder deficits  Pt able to complete tasks with no LOB/SOB with intermittent rest breaks provided   Exercises: Pt completed 5 minutes of arm bike, 2.5 min  forward, 2.5 min backward in order to increase BUE/BLEstrength and endurance in preparation for increased independence in ADLs such as UB dressing. No rest breaks required on light resistance  Other treatments: OT providing skilled input on pressure relief when in chair, pt completing during session with Max VC. OT re-iterating importance of pressure relief for prevention of skin breakdown, OT discussed schedule (every 15-20 min for 2 min). Pt reporting does reposition self when in chair.    Pt supine in bed with bed alarm activated, 2 bed rails up, call light within reach and 4Ps assessed. Pt positioned with PRAFOs, doffed TEDs with total A, pt requested to maintain abd binder on.    Therapy/Group: Individual Therapy  Nila Barth, OTD, OTR/L 11/01/2023, 3:33 PM

## 2023-11-01 NOTE — Progress Notes (Signed)
 PROGRESS NOTE   Subjective/Complaints:  Pt reports still has foley.  Just relaxing this AM- getting mentally ready for therapy.  Feeling like will be burden on wife- scared won't walk again- scared to ask me.  RLE was hurting/tight yesterday- tylenol  helped- tightness/spasticity is slightly better this AM.    Per dietitian, pt has severe malnutrition.    ROS:   Pt denies SOB, abd pain, CP, N/V/C/D, and vision changes  R heel is hurting him "for some reason"-- doesn't mention this today.   Objective:   No results found.   Recent Labs    11/01/23 0546  WBC 6.4  HGB 8.4*  HCT 26.4*  PLT 222     Recent Labs    11/01/23 0546  NA 139  K 3.8  CL 101  CO2 31  GLUCOSE 192*  BUN 37*  CREATININE 1.51*  CALCIUM  8.4*      Intake/Output Summary (Last 24 hours) at 11/01/2023 0759 Last data filed at 11/01/2023 0448 Gross per 24 hour  Intake 837 ml  Output 2250 ml  Net -1413 ml     Pressure Injury 10/28/23 Sacrum Medial Stage 1 -  Intact skin with non-blanchable redness of a localized area usually over a bony prominence. STAGE 1 ON SACRUM (Active)  10/28/23 1333  Location: Sacrum  Location Orientation: Medial  Staging: Stage 1 -  Intact skin with non-blanchable redness of a localized area usually over a bony prominence.  Wound Description (Comments): STAGE 1 ON SACRUM  Present on Admission:      Pressure Injury 10/28/23 Heel Right Deep Tissue Pressure Injury - Purple or maroon localized area of discolored intact skin or blood-filled blister due to damage of underlying soft tissue from pressure and/or shear. DTI on bottom of heel (Active)  10/28/23 1820  Location: Heel  Location Orientation: Right  Staging: Deep Tissue Pressure Injury - Purple or maroon localized area of discolored intact skin or blood-filled blister due to damage of underlying soft tissue from pressure and/or shear.  Wound Description  (Comments): DTI on bottom of heel  Present on Admission:     Physical Exam: Vital Signs Blood pressure (!) 122/51, pulse (!) 101, temperature (!) 97.4 F (36.3 C), temperature source Oral, resp. rate 18, height 5\' 4"  (1.626 m), weight 60.6 kg, SpO2 97%.    General: awake, alert, appropriate, supine in bed; "relaxing". NAD HENT: conjugate gaze; oropharynx moist CV: regular rhythm; borderline tachycardic  rate; no JVD Pulmonary: CTA B/L; no W/R/R- good air movement GI: soft, NT, ND, (+)BS- normoactive Psychiatric: appropriate but sad about likely not walking again Neurological: Ox3  Skin: Heels covered with foam dressings B/L- R heel DTI and Sacral Stage I GU- foley in place, clear yellow urine  PRIOR EXAMS: Neurological: Ox3- MAS of 2-3 in LE's MSK: Ue's 4/5 B/L throughout LE's- 2-/5 B/L-in HF/KE- 4-/5 DF/PF but RLE weaker than LLE   Spasticity- MAS of 1+ to 2 in Ue's this Am and 3 in RLE- MAS worse this AM- 2-3 throughout Neurologic: Cranial nerves II through XII intact, motor strength is 4/5 in bilateral  bicep, tricep, grip,and finger ext and hand intrinsics, 3- RIght delt and 4/5 left  delt, 3- bilateral  hip flexor, 3- RIght and 4- left knee extensors, 4/5 bilateral ankle dorsiflexor and plantar flexor Sensory exam normal sensation to light touch and proprioception in bilateral upper and lower extremities Tone- 3+ a bilateral biceps triceps and BR, + Hoffman's bilateral 2/5 RIght 3+ left knee Also MAS 3 tone in RIght Knee flexors and extensors, MAS 1 at the left knee Clonus at bilateral ankles  Musculoskeletal: Full range of motion in all 4 extremities. No joint swelling      Assessment/Plan: 1. Functional deficits which require 3+ hours per day of interdisciplinary therapy in a comprehensive inpatient rehab setting. Physiatrist is providing close team supervision and 24 hour management of active medical problems listed below. Physiatrist and rehab team continue to  assess barriers to discharge/monitor patient progress toward functional and medical goals  Care Tool:  Bathing              Bathing assist Assist Level: Dependent - Patient 0%     Upper Body Dressing/Undressing Upper body dressing        Upper body assist Assist Level: Moderate Assistance - Patient 50 - 74%    Lower Body Dressing/Undressing Lower body dressing            Lower body assist Assist for lower body dressing: Dependent - Patient 0%     Toileting Toileting    Toileting assist Assist for toileting: Dependent - Patient 0%     Transfers Chair/bed transfer  Transfers assist     Chair/bed transfer assist level: Maximal Assistance - Patient 25 - 49%     Locomotion Ambulation   Ambulation assist   Ambulation activity did not occur: Safety/medical concerns  Assist level: 2 helpers   Max distance: 5'   Walk 10 feet activity   Assist  Walk 10 feet activity did not occur: Safety/medical concerns        Walk 50 feet activity   Assist Walk 50 feet with 2 turns activity did not occur: Safety/medical concerns         Walk 150 feet activity   Assist Walk 150 feet activity did not occur: Safety/medical concerns         Walk 10 feet on uneven surface  activity   Assist Walk 10 feet on uneven surfaces activity did not occur: Safety/medical concerns         Wheelchair     Assist Is the patient using a wheelchair?: Yes Type of Wheelchair: Manual    Wheelchair assist level: Dependent - Patient 0% Max wheelchair distance: 150    Wheelchair 50 feet with 2 turns activity    Assist        Assist Level: Dependent - Patient 0%   Wheelchair 150 feet activity     Assist      Assist Level: Dependent - Patient 0%   Blood pressure (!) 122/51, pulse (!) 101, temperature (!) 97.4 F (36.3 C), temperature source Oral, resp. rate 18, height 5\' 4"  (1.626 m), weight 60.6 kg, SpO2 97%.  Medical Problem List and  Plan: 1. 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  Antithrombotics: -DVT/anticoagulation:  Mechanical: Sequential compression devices, below knee Bilateral lower extremities 4/23- will see if Dr Ellery Guthrie will allow us  to start Lovenox - he said yes, so will start. 30mg  daily.             -antiplatelet therapy: N/A 3. Pain Management: tylenol  prn. Voltaren  QID, lyrica  50mg  BID, flexeril  PRN -10/24/23 leg muscle soreness likely from PT/OT this week; will start muscle rub cream 4/28- still c/o muscle soreness in legs- con't pain regimen as needed 4/30- still sore, but don't want Flexeril  since likely caused confusion- con't tylenol  prn- can use muscle rub which is ordered 5/1- denies a lot of pain- mainly sore form spasticity/working out 5/5- pain yesterday- not today-  4. Mood/Behavior/Sleep: LCSW to follow up for evaluation and support when appropriate --Delirium precautions--His mental status has cleared but will monitor for worsening with transfer to new unit .pt has not required prn meds for agitation in several days  Monitor sleep wake cycle             -antipsychotic agents: Change haldol  q 6 hrs prn to Seroquel  12.5mg  tid prn  -Restoril  7.5mg  nightly, cymbalta  20mg  daily 4/23-4/25 no delirium so far 4/28- having some delirium this AM? Not severe, but will monitor since has had in recent past 4/29- still confused this AM- but per nursing , was less confused overnight after Restoril  stopped- still pleasantly confused this AM when first woke up- didn't open eyes.  4/30-5/3 Aox3 this AM 5. Neuropsych/cognition: This patient is usually  capable of making  decisions on his own behalf. 6. Skin/Wound Care: Routine pressure relief measures. 7. Fluids/Electrolytes/Nutrition:  Monitor I/O. Routine labs. Continue vitamins/supplements. 8. 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  -10/23/23 foley replaced yesterday by Dr. Willye Harvey, per note, leave in place for 7-10d, Consider resuming In-N-Out catheterization for management of his neurogenic bladder at that time. Recommend using a coud catheter for intermittent catheterization (when resumed).  4/28- will leave foley for 7-10 days 10/31/23 foley likely ready to come out tomorrow? Placed 4/25 5/5- will d/c foley in AM after speaking with pt 10.  Diabetes- A1c 8.0-  with peripheral neuropathy by hx only on Jardiance at home, use SSI in the hospital  -10/23/23 CBGs variable but likely d/t steroids; cont SSI for now 4/29- CBG's very labile in last 24 hours 99-383- - it concerns me to start Jardiance since of Foley and risk of UTI's- also for starting Tradjenta  since BG was 99 last night- don't want to drop BG too much 4/30- Will add Tradjenta  since BG's still running 150-242- will monitor closely for CBG  hypoglycemia-   10/30/23 CBGs looking  great!  -10/31/23 CBGs a little higher the last day, but monitor for trend  5/5- Cbgs all less than 200- con't to monitor CBG (last 3)  Recent Labs    10/31/23 1609 10/31/23 2039 11/01/23 0535  GLUCAP 145* 199* 185*     11.  Spasticity affecting LEs> UE and R>L , aggressive ROM  with PT, consider oral meds  4/22- starting baclofen  5 mg TID- with his Cr 1.50- has been running 1.68 to 2.30  4/24- will not increase baclofen  due to Renal issues 12.  PAD s/p SFA and Iliac stenting, continue metoprolol  50mg  daily  13. CKD3b? With AKI -4/22- Pt's Cr down to 1.50 from 2.29 2 days ago and 1.68 yesterday- will monitor 2x/week to see if will reduce further or be labile.   4/24- Cr 1.84- and BUN stable at 55- is drinking per pt  4/28- Cr 1.63- doing better 4/29- Cr 1.59 after 12+ hours of IVF's- and BUN very slightly down to 70 from 72-   5/1- Cr down to 1.43 and BUN down to 53  5/5- Cr 1.51 and BUN down to 37- drinking more! 14. Questionable neurogenic bowel: continue miralax  32g daily, senokot s 2 tabs qAM 4/24- required suppository 2 days ago however had BM on bedpan yesterday- thinks is mild.   4/25- smear that was incontinent last night -10/23/23 LBM last night per pt was very hard still, but documented as loose-- doesn't like miralax  (on 32g daily)-- will add colace 200mg  daily, see if this helps, if continued loose stools then would stop the miralax  first. Can mix miralax  with half juice/half water.  -10/24/23 looser stools yesterday, will stop miralax , leave colace-- pt prefers this  4/28- LBM 2 days ago  4/29- LBM small liquid stool 4/28- and 1 medium stool as well-   4/30- LBM overnight  5/1- LBM this Am- was accident- might need bowel program 5/2- Placed on suppository nightly with possible dig stim- had multiple BM's during/after bowel program- if continues like this, will need dig stim.   -10/31/23 LBM last night x2 5/5- LBM last night with bowel program 15. HLD: crestor  20mg   nightly 16. Delirium/Confusion 4/29- yesterday given IVF's for this- checked U/A and Cx- which showed mod leuks, however rate bacteria and very little WBC's 6-10- which is not a UTI in most cases- will d/w pharmacy-  -pt still pleasantly confused this AM- but didn't even open eyes, was so sleepy, so hard to tease out what is due to sleepiness and what is actual delirium- per nurse, much better last night-  4/30- pt Ox3 this AM- very with it- likely flexeril  based on chart review- given for pain since on no pain meds- will keep off Flexeril  and monitor Sx's  5/5- Sx's resolved 17. Hx of HTN with severe hypotension 4/30- pt's BP dropped to 79 systolic yesterday when sitting with OT- not clear if had TEDS/ACE wraps- will reduce Metoprolol  XL to 12.5 mg daily- from 50 mg daily and see if that helps- don't want ot add midodrine  quite yet.  5/1- Adding midodrine  5 mg TID and adding abd binder- couldn't tolerate ACE wraps at all- just TED's-   5/2- BP yesterday was less symptomatic- but still dropped some- con't regimen -5/3-4/25 BPs a bit better recently; monitor Vitals:   10/29/23 0400 10/29/23 1407 10/29/23 1727 10/29/23 2012  BP: (!) 139/48 (!) 102/53 134/71 (!) 133/56   10/30/23 0625 10/30/23 1025 10/30/23 1336 10/30/23 1806  BP: (!) 113/52 (!) 106/40 (!) 115/44 (!) 133/57   10/31/23 0359 10/31/23 1335 10/31/23 1900 11/01/23 0440  BP: (!) 140/62 (!) 120/52 (!) 129/54 (!) 122/51     18. Atelectasis 4/30- sounds coarse- asked pt to use ICS regularly- is at bedside- also described "spitting things up"_ had "spit cup"- at bedside-  had barely anything in it. 5/5- not spitting up anymore  19. R heel DTI and Stage I on sacrum 5/2- is new- ordered prevalon boots as well as low air loss mattress and needs to be turned q2 hours- placed order 20. Spasticity  5/5- will d/w wife tomorrow about restarting Baclofen  vs Dantrolene.  21. Severe malnutrition  5/5- per Dietitian- will do supplements  I  spent a total of  41  minutes on total care today- >50% coordination of care- due to  D/w pt about spasticity;  will d/w wife in Am about spasticity and baclofen /vs dantrolene- andd/w nursing about overnight- reviewed labs and vitals and B/B as well    LOS: 14 days A FACE TO FACE EVALUATION WAS PERFORMED  Christie Copley 11/01/2023, 7:59 AM

## 2023-11-01 NOTE — Progress Notes (Signed)
 Physical Therapy Session Note  Patient Details  Name: Christian Bailey MRN: [id number] Date of Birth: 10-27-1940  Today's Date: 11/01/2023 PT Individual Time: 1306-1400 PT Individual Time Calculation (min): 54 min   Short Term Goals: Week 1:  PT Short Term Goal 1 (Week 1): Pt will initiate gait training PT Short Term Goal 1 - Progress (Week 1): Met PT Short Term Goal 2 (Week 1): Pt will perform STS with mod a consistently PT Short Term Goal 2 - Progress (Week 1): Not met PT Short Term Goal 3 (Week 1): Pt will perform bed mobility with mod a PT Short Term Goal 3 - Progress (Week 1): Not met Week 2:  PT Short Term Goal 1 (Week 2): Pt will be able to perform bed mobility with mod assist PT Short Term Goal 2 (Week 2): Pt will be able to perform bed <> w/c transfer with mod assist PT Short Term Goal 3 (Week 2): Pt will be able to tolerate standing for weightbearing x 5 min PT Short Term Goal 4 (Week 2): Pt will initiate PWC mobility  Skilled Therapeutic Interventions/Progress Updates:    Session started a few min late due to pt using bedpan with nursing staff. Session focused on functional bed mobility, slideboard transfers, PWC mobility and functions, and d/c planning/education. Pt requires max assist for rolling with hooking technique and some grip to bed rail and max for supine > sit. Attempted to place slideboard transfer but due to air mattress and height of bed (cannot be lowered due to style of bed), required a second person to assist with set up and complete transfer safely (max A). Cues provided for hand placement, facilitating anterior weightshift and head/hips relationship. Reviewed features on PWC for positioning, pressure relief, and functions with supervision. Supervision for mobility in halway including turns but in tighter spaces needs more verbal cues -pt does not have some visual impairment as well as far as managing screen and obstacles. Discussed home measurement sheet, home access -  reports they were working on [place] getting ramp installed but it won't fit in their garage so would have to be outside, which pt does not like, so are looking into some kind of lift system. Recommended that pt ask wife to come in for family education this week to start the process and have her start practicing hands on transfer and bed mobility training. Will confirm with CSW and in conference tmrw about plans for family education. Discussed PWC and how that would require an evaluation, measurements for his chair and a loaner w/c as well as need to discuss with family transportation of the [facility] after d/c (reviewed various options). End of session left up in PWC in hallway with RN to switch out the bed.   Therapy Documentation Precautions:  Precautions Precautions: Fall, Cervical Precaution/Restrictions Comments: decreased recall of precautions, not able to verbalize any when prompted Required Braces or Orthoses: Cervical Brace Cervical Brace: Soft collar Restrictions Weight Bearing Restrictions Per Provider Order: No    Pain: Reports he has been having pain more in LE's today - medication helping. Reports this PM is better than this morning.     Therapy/Group: Individual Therapy  Gita Lamb [name], PT, DPT, CBIS  11/01/2023, 3:13 PM

## 2023-11-01 NOTE — Progress Notes (Signed)
 Patient ID: Christian Bailey, male   DOB: [birth year], 83 y.o.   MRN: [id number]  Received call from wife MD wanted to speak with her and have messaged MD and gave her wife's cell number.

## 2023-11-01 NOTE — Plan of Care (Signed)
  Problem: Consults Goal: RH GENERAL PATIENT EDUCATION Description: See Patient Education module for education specifics. Outcome: Progressing   Problem: RH BOWEL ELIMINATION Goal: RH STG MANAGE BOWEL WITH ASSISTANCE Description: STG Manage Bowel with mod I  Assistance. Outcome: Progressing Goal: RH STG MANAGE BOWEL W/MEDICATION W/ASSISTANCE Description: STG Manage Bowel with Medication with mod I Assistance. Outcome: Progressing   Problem: RH BLADDER ELIMINATION Goal: RH STG MANAGE BLADDER WITH ASSISTANCE Description: STG Manage Bladder With toileting / mod I Assistance Outcome: Progressing Goal: RH STG MANAGE BLADDER WITH MEDICATION WITH ASSISTANCE Description: STG Manage Bladder With Medication With mod I Assistance. Outcome: Progressing   Problem: RH SAFETY Goal: RH STG ADHERE TO SAFETY PRECAUTIONS W/ASSISTANCE/DEVICE Description: STG Adhere to Safety Precautions With cues Assistance/Device. Outcome: Progressing   Problem: RH PAIN MANAGEMENT Goal: RH STG PAIN MANAGED AT OR BELOW PT'S PAIN GOAL Description: < 4 with prns Outcome: Progressing   Problem: RH KNOWLEDGE DEFICIT GENERAL Goal: RH STG INCREASE KNOWLEDGE OF SELF CARE AFTER HOSPITALIZATION Description: Patient and wife will be able to manage care at discharge using educational resources for medication, skin care,dietary modification and fall prevention independently Outcome: Progressing   Problem: Education: Goal: Ability to describe self-care measures that may prevent or decrease complications (Diabetes Survival Skills Education) will improve Outcome: Progressing Goal: Individualized Educational Video(s) Outcome: Progressing   Problem: Coping: Goal: Ability to adjust to condition or change in health will improve Outcome: Progressing   Problem: Fluid Volume: Goal: Ability to maintain a balanced intake and output will improve Outcome: Progressing   Problem: Health Behavior/Discharge Planning: Goal: Ability  to identify and utilize available resources and services will improve Outcome: Progressing Goal: Ability to manage health-related needs will improve Outcome: Progressing   Problem: Metabolic: Goal: Ability to maintain appropriate glucose levels will improve Outcome: Progressing   Problem: Nutritional: Goal: Maintenance of adequate nutrition will improve Outcome: Progressing Goal: Progress toward achieving an optimal weight will improve Outcome: Progressing   Problem: Skin Integrity: Goal: Risk for impaired skin integrity will decrease Outcome: Progressing   Problem: Tissue Perfusion: Goal: Adequacy of tissue perfusion will improve Outcome: Progressing

## 2023-11-01 NOTE — Progress Notes (Shared)
 IP Rehab Bowel Program Documentation   Bowel Program Start time [phone]  Dig Stim Indicated? No  Dig Stim Prior to Suppository or mini Enema X ***   Output from dig stim: {Desc; minimal/small/moderate/large/very large:110034}  Ordered intervention: Suppository Yes , mini enema No ,   Repeat dig stim after Suppository or Mini enema  X {Numbers; 1-5:17750},  Output? {Desc; minimal/small/moderate/large/very large:110034}   Bowel Program Complete? No , handoff given 1900  Patient Tolerated? {YES/NO:21197}

## 2023-11-01 NOTE — Progress Notes (Signed)
 Occupational Therapy Session Note  Patient Details  Name: Christian Bailey MRN: [id number] Date of Birth: 12/19/1940  Today's Date: 11/01/2023 OT Individual Time: 4098-1191 OT Individual Time Calculation (min): 75 min    Short Term Goals: Week 1:  OT Short Term Goal 1 (Week 1): Pt will complete self feeding with Ae as necessary at Min A OT Short Term Goal 2 (Week 1): Pt will complete sit to stand with LRAD at Mod A in preparation for ADLs OT Short Term Goal 3 (Week 1): Pt will complete LB dressing at Max A with AE as necessary  Skilled Therapeutic Interventions/Progress Updates:    Pt resting in bed upon arrival. OT intervention with focus on bed mobility, sitting balance, PWC mobility, and activity tolerance to incf\rease independence with BADLs. Ted hose and abd binder donned at bed level. All bed mobility with max A (rolling and supine>sit EOB. Sitting balance EOB with close supervision and pt using BUE for support/grasping on EOB. SB transfer to w/c with tot A (2/2 bed height not appropriate for pt assistance.) PWC mobility in hallway and entering/exting rooms with supervision. BUE/sitting balance tasks at BITS sitting in PWC and using drum stick. Pt completed visual scanning activities 5x1 min with unsupported sitting (min A for sitting). Pt returned to room and remained in PWC. All needs within reach.   Therapy Documentation Precautions:  Precautions Precautions: Fall, Cervical Precaution/Restrictions Comments: decreased recall of precautions, not able to verbalize any when prompted Required Braces or Orthoses: Cervical Brace Cervical Brace: Soft collar Restrictions Weight Bearing Restrictions Per Provider Order: No Pain: Pt reports pain in RLE better Therapy/Group: Individual Therapy  [name] 11/01/2023, 10:53 AM

## 2023-11-02 DIAGNOSIS — G8250 Quadriplegia, unspecified: Secondary | ICD-10-CM | POA: Diagnosis not present

## 2023-11-02 LAB — GLUCOSE, CAPILLARY
Glucose-Capillary: 129 mg/dL — ABNORMAL HIGH (ref 70–99)
Glucose-Capillary: 197 mg/dL — ABNORMAL HIGH (ref 70–99)
Glucose-Capillary: 220 mg/dL — ABNORMAL HIGH (ref 70–99)
Glucose-Capillary: 128 mg/dL — ABNORMAL HIGH (ref 70–99)

## 2023-11-02 NOTE — Progress Notes (Signed)
 Physical Therapy Session Note  Patient Details  Name: Christian Bailey MRN: [id number] Date of Birth: March 14, 1941  Today's Date: 11/02/2023 PT Individual Time: 1520-1630 PT Individual Time Calculation (min): 70 min   Short Term Goals: Week 3:  PT Short Term Goal 1 (Week 3): Pt will perform bed mobility with mod A PT Short Term Goal 2 (Week 3): Pt will progress transfers to max A PT Short Term Goal 3 (Week 3): Pt will be able to tolerate x 5 min in standing for WB/postural control retraining/tone management  Skilled Therapeutic Interventions/Progress Updates:  Ambulation/gait training;Cognitive remediation/compensation;Discharge planning;DME/adaptive equipment instruction;Functional mobility training;Pain management;Psychosocial support;Splinting/orthotics;Therapeutic Activities;UE/LE Strength taining/ROM;Visual/perceptual remediation/compensation;Wheelchair propulsion/positioning;UE/LE Coordination activities;Therapeutic Exercise;Stair training;Patient/family education;Skin care/wound management;Balance/vestibular training;Community reintegration;Disease management/prevention;Neuromuscular re-education;Functional electrical stimulation   Pt received seated in power WC and agrees to therapy. During session pt mentions pain in both Rt shoulder and Rt knee. PT provides rest breaks as needed to manage pain. Pt manages power WC and drives to gym withminA for management of controls initially and with cueing for safety during navigation of obstacles. Pt initially works on transfer training and initiation of gait. Pt performs multiple reps of sit to stand during session, typically requiring modA +2 to maxA +2, with cues for initiation, trunk positioning, sequencing, and body mechanics. Upon standing, pt requires increased time and cueing to achieve upright posture with cues at hips and trunk for extension. Once pt achieve close-to-neutral posture, PT and rehab tech adjust guarding to provide bilateral hand held  assistance. Pt then ambulates with PT providing consistent manual assistance of placement of RLE due to pt tending to scissor, with difficulty advancing contralateral foot. Pt ambulates bouts of x8', x15', and x15' with extended seated rest breaks between each bout.   Pt drives WC to dayroom and PT assists with positioning of power WC to setup transfer to Nustep. Pt performs stand pivot transfer from [facility] to Nustep with MaxA +1 and cues for body mechanics, sequencing, and initiation. Pt completes Nustep for reciprocal coordination training as well as tone management. Pt completes total of x5:00 at workload of 4 with average steps per minute ~30. Pt does not utilize RUE due to pain in shoulder, and PT provides cues for placement of BLEs and LUE. Pt performs stand step transfer from Nustep to [facility] with modA and same cues. Pt drives WC back to room. Left with all needs within reach.   Therapy Documentation Precautions:  Precautions Precautions: Fall, Cervical Precaution/Restrictions Comments: decreased recall of precautions, not able to verbalize any when prompted Required Braces or Orthoses: Cervical Brace Cervical Brace: Soft collar Restrictions Weight Bearing Restrictions Per Provider Order: No   Therapy/Group: Individual Therapy  Neva Barban, PT, DPT 11/02/2023, 4:44 PM

## 2023-11-02 NOTE — Progress Notes (Addendum)
 Occupational Therapy Weekly Progress Note  Patient Details  Name: Christian Bailey MRN: [id number] Date of Birth: [birth year]  Beginning of progress report period: October 26, 2023 End of progress report period: Nov 02, 2023  Today's Date: 11/02/2023 OT Individual Time: 1035-1130 OT Individual Time Calculation (min): 55 min    Patient has met 3 of 3 short term goals.  Pt overall slow progress with ADL performance. Pt still requiring total A for LB ADLs and mobility, Pt Mod A with UB ADLs. Pt with increased spasticity in BLE. Family conference held 5/1, will need family education/training for family to learn to provide appropriate care once D/C.   Patient continues to demonstrate the following deficits: muscle weakness and muscle joint tightness, decreased cardiorespiratoy endurance, abnormal tone and unbalanced muscle activation, and decreased sitting balance, decreased standing balance, decreased postural control, and decreased balance strategies and therefore will continue to benefit from skilled OT intervention to enhance overall performance with BADL and Reduce care partner burden.  Patient  LTG downgraded to Mod A overall .  Continue plan of care.  OT Short Term Goals Week 1:  OT Short Term Goal 1 (Week 1): Pt will complete self feeding with Ae as necessary at Min A OT Short Term Goal 1 - Progress (Week 1): Met OT Short Term Goal 2 (Week 1): Pt will complete sit to stand with LRAD at Mod A in preparation for ADLs OT Short Term Goal 2 - Progress (Week 1): Not met OT Short Term Goal 3 (Week 1): Pt will complete LB dressing at Max A with AE as necessary OT Short Term Goal 3 - Progress (Week 1): Not met Week 2:  OT Short Term Goal 1 (Week 2): Pt will complete static sitting EOB at Mod A in preparation for ADLs OT Short Term Goal 2 (Week 2): Pt will complete UB dressing with Max A OT Short Term Goal 3 (Week 2): Pt will complete self feeding with set up Week 3:  OT Short Term Goal 1 (Week 3): Pt  will complete dynamic sitting with SBA in preparation for ADL tasks OT Short Term Goal 2 (Week 3): Pt will complete bed mobility with Mod A in preparation for ADLs OT Short Term Goal 3 (Week 3): Pt will complete UB dressing at Min A consistently throughout sessions  Skilled Therapeutic Interventions/Progress Updates:      Therapy Documentation Precautions:  Precautions Precautions: Fall, Cervical Precaution/Restrictions Comments: decreased recall of precautions, not able to verbalize any when prompted Required Braces or Orthoses: Cervical Brace Cervical Brace: Soft collar Restrictions Weight Bearing Restrictions Per Provider Order: No General: "I am in a bit of a funk today" Pt supine in bed asleep upon OT arrival, easily aroused, agreeable to OT session. Pt declining bathing this session again, OT encouraging pt for bathing tasks, OT discussed with pt can schedule extra time for decreased rushing for pt, pt agreeable.  Pain: no pain reported  ADL: OT providing skilled intervention on ADL retraining in order to increase independence with tasks and increase activity tolerance. Pt completed the following tasks at the current level of assist: Bed mobility: Max A +1, OT assisting with rolling, pt able to grab bed rail to maintain side lying on Lt and Rt for peri care Grooming/oral hygiene: set-up seated in W/C at sink Toileting: pt reporting potential need for BM, OT assisting total A to put pt on bed pan, pt unable to void this date, OT changing foam dressing on sacrum  UB dressing: Min  A, pt able to thread BUE into shirt, OT providing intervention to don over head while seated in W/C, OT also assisting total A in supine to don soft collar LB dressing: total A at bed level, rolling Lt and Rt to don pants over waist Footwear: total A for shoes while pt seated on side of bed at CGA with BUE support Transfers: Max-total A for lateral transfer with transfer board from EOB>PWC, OT placing board  under buttocks    Other Treatments: OT providing therapeutic use of self in order to improve pt mood and discussed D/C. Pt reported nervous for D/C d/t level of assistance required. OT re-iterating importance of family coming in to complete family training and mentioned other options for increased care once D/C such as potential home health if insurance allows or private pay. OT also providing skilled intervention for tone/spacticity management for BLE by completing stretching in all planes for BLE.   Pt seated in PWC, tilted for pressure relief at end of session with W/C alarm donned, call light within reach and 4Ps assessed.    Therapy/Group: Individual Therapy  Nila Barth, OTD, OTR/L 11/02/2023, 12:53 PM

## 2023-11-02 NOTE — Progress Notes (Signed)
 Physical Therapy Session Note  Patient Details  Name: Christian Bailey MRN: [id number] Date of Birth: 1940-12-26  Today's Date: 11/02/2023 PT Individual Time: 1300-1400 PT Individual Time Calculation (min): 60 min   Short Term Goals: Week 2:  PT Short Term Goal 1 (Week 2): Pt will be able to perform bed mobility with mod assist PT Short Term Goal 2 (Week 2): Pt will be able to perform bed <> w/c transfer with mod assist PT Short Term Goal 3 (Week 2): Pt will be able to tolerate standing for weightbearing x 5 min PT Short Term Goal 4 (Week 2): Pt will initiate PWC mobility  Skilled Therapeutic Interventions/Progress Updates: Pt presents sitting in PWC, reclined back for pressure relief, w/ power still on.  Encouraged pt to trun off power when sitting to avoid accidental pushes of controls.  Pt negotiates PWC down hallway w/ supervision, occasional cues for avoiding objects to either side and requires multiple cues for directions.  Pt performed multiple pressure relief changes during sessions to familiarize w/ performing.  Pt negotiated on/off elevators x 2 w/ 1 instance of pressing into PT w/ turning around.  Pt negotiated outdoors on uneven surfaces including down ramped surface and then turned  around w/ discussion of mulch to Right and curb to left.  Pt returned to room and remained sitting in PWC w/ seat belt on, all needs in reach.  Pt reclines chair and turns power off.       Therapy Documentation Precautions:  Precautions Precautions: Fall, Cervical Precaution/Restrictions Comments: decreased recall of precautions, not able to verbalize any when prompted Required Braces or Orthoses: Cervical Brace Cervical Brace: Soft collar Restrictions Weight Bearing Restrictions Per Provider Order: No General:   Vital Signs: Therapy Vitals Temp: 97.6 F (36.4 C) Temp Source: Oral Pulse Rate: 97 Resp: 16 BP: (!) 121/52 Patient Position (if appropriate): Sitting Oxygen Therapy SpO2: 100 % O2  Device: Room Air Pain:0/10      Therapy/Group: Individual Therapy  Kamau Weatherall P Javiana Anwar 11/02/2023, 2:01 PM

## 2023-11-02 NOTE — Progress Notes (Addendum)
 Patient ID: Christian Bailey, male   DOB: [birth year], 83 y.o.   MRN: [id number]  Spoke with wife via telephone to discuss team conference and progress. Concerned regarding pt will be much care for wife and whether she can do the care he will require at discharge. Made aware discharge date of 5/20. Also let know MD has attempted four times to call her and has not reached her. Wife only had one missed call. She wants to come in and discuss with worker and pt hs care needs and alternatives. When ask pt question he defers to wife she is aware of this.  3;25 PM Have met with pt and wife who is here to update regarding goals and care at discharge. MD came in and discussed medical issues and to begin baclofen  and to leave the foley cath in. Have scheduled wife to come on Friday from 10-12 to begin to see husband's care that will be needed at home. Aware wife may not be able to provide this level of care. Son on the phone-Daniel and asked about lifts that could be purchased to assist wife in pt's care. Discussed SNF and that being an option. Will continue to work on realistic discharge option

## 2023-11-02 NOTE — Progress Notes (Signed)
 Physical Therapy Weekly Progress Note  Patient Details  Name: Christian Bailey MRN: [id number] Date of Birth: Mar 15, 1941  Beginning of progress report period: October 26, 2023 End of progress report period: Nov 02, 2023   Patient has met 1 of 4 short term goals.  Pt has made minimal functional gains this week with mobility, still requiring max to +2 assist for transfers and bed mobility. Pt continues with spasticity which is difficult to manage during transitional movements. Have initiated PWC mobility this week and pt is at supervision to [facility] level managing positioning and driving in household environments. Family conference occurred on 5/1 with plan for hands on family education to determine d/c planning and equipment needs.    Patient continues to demonstrate the following deficits muscle weakness and muscle joint tightness, decreased cardiorespiratoy endurance, decreased visual acuity, and decreased sitting balance, decreased standing balance, decreased postural control, and decreased balance strategies and therefore will continue to benefit from skilled PT intervention to increase functional independence with mobility.  Patient  is making slow progress towards overall goals .  Continue plan of care. Will likely need to downgrade goals to reflect more family education goals but will wait to see if any functional mobility progresses this week.  PT Short Term Goals Week 2:  PT Short Term Goal 1 (Week 2): Pt will be able to perform bed mobility with mod assist PT Short Term Goal 1 - Progress (Week 2): Not met PT Short Term Goal 2 (Week 2): Pt will be able to perform bed <> w/c transfer with mod assist PT Short Term Goal 2 - Progress (Week 2): Not met PT Short Term Goal 3 (Week 2): Pt will be able to tolerate standing for weightbearing x 5 min PT Short Term Goal 3 - Progress (Week 2): Not met PT Short Term Goal 4 (Week 2): Pt will initiate PWC mobility PT Short Term Goal 4 - Progress (Week 2):  Met Week 3:  PT Short Term Goal 1 (Week 3): Pt will perform bed mobility with mod A PT Short Term Goal 2 (Week 3): Pt will progress transfers to max A PT Short Term Goal 3 (Week 3): Pt will be able to tolerate x 5 min in standing for WB/postural control retraining/tone management  Skilled Therapeutic Interventions/Progress Updates:  Ambulation/gait training;Cognitive remediation/compensation;Discharge planning;DME/adaptive equipment instruction;Functional mobility training;Pain management;Psychosocial support;Splinting/orthotics;Therapeutic Activities;UE/LE Strength taining/ROM;Visual/perceptual remediation/compensation;Wheelchair propulsion/positioning;UE/LE Coordination activities;Therapeutic Exercise;Stair training;Patient/family education;Skin care/wound management;Balance/vestibular training;Community reintegration;Disease management/prevention;Neuromuscular re-education;Functional electrical stimulation   Therapy Documentation Precautions:  Precautions Precautions: Fall, Cervical Precaution/Restrictions Comments: decreased recall of precautions, not able to verbalize any when prompted Required Braces or Orthoses: Cervical Brace Cervical Brace: Soft collar Restrictions Weight Bearing Restrictions Per Provider Order: No   Faye Hoops, PT, DPT, CBIS  11/02/2023, 3:29 PM

## 2023-11-02 NOTE — Progress Notes (Signed)
 PROGRESS NOTE   Subjective/Complaints:  Pt reports RLE not hurting this AM- which is great, since yesterday, RLE started hurting as soon as I left the room.   Pts' HR is slightly elevated this AM- 100's to 110s- at rest- will monitor  Pt is fine with whatever wife want son foley- needs to come out but sounds like wife not sure she wants it removed.    Also, pt wants me to d/w wife first before starting meds for spasticity to help R>LLE.    ROS:   Pt denies SOB, abd pain, CP, N/V/C/D, and vision changes  R heel is hurting him "for some reason"-- doesn't mention this today.   Objective:   No results found.   Recent Labs    11/01/23 0546  WBC 6.4  HGB 8.4*  HCT 26.4*  PLT 222     Recent Labs    11/01/23 0546  NA 139  K 3.8  CL 101  CO2 31  GLUCOSE 192*  BUN 37*  CREATININE 1.51*  CALCIUM  8.4*      Intake/Output Summary (Last 24 hours) at 11/02/2023 0819 Last data filed at 11/02/2023 0748 Gross per 24 hour  Intake 712 ml  Output 1500 ml  Net -788 ml     Pressure Injury 10/28/23 Sacrum Medial Stage 1 -  Intact skin with non-blanchable redness of a localized area usually over a bony prominence. STAGE 1 ON SACRUM (Active)  10/28/23 1333  Location: Sacrum  Location Orientation: Medial  Staging: Stage 1 -  Intact skin with non-blanchable redness of a localized area usually over a bony prominence.  Wound Description (Comments): STAGE 1 ON SACRUM  Present on Admission:      Pressure Injury 10/28/23 Heel Right Deep Tissue Pressure Injury - Purple or maroon localized area of discolored intact skin or blood-filled blister due to damage of underlying soft tissue from pressure and/or shear. DTI on bottom of heel (Active)  10/28/23 1820  Location: Heel  Location Orientation: Right  Staging: Deep Tissue Pressure Injury - Purple or maroon localized area of discolored intact skin or blood-filled blister due to  damage of underlying soft tissue from pressure and/or shear.  Wound Description (Comments): DTI on bottom of heel  Present on Admission:     Physical Exam: Vital Signs Blood pressure (!) 122/43, pulse (!) 108, temperature 97.7 F (36.5 C), temperature source Oral, resp. rate 16, height 5\' 4"  (1.626 m), weight 60.6 kg, SpO2 95%.     General: awake, alert, appropriate, supine in bed; awake- NAD HENT: conjugate gaze; oropharynx moist CV: regular rhythm, tachycardic rate; no JVD Pulmonary: CTA B/L; no W/R/R- good air movement GI: soft, NT, ND, (+)BS- normoactive Psychiatric: appropriate- interactive- more awake Neurological: Ox3 MAS of 2-3 in RLE  Skin: Heels covered with foam dressings B/L- R heel DTI and Sacral Stage I GU- foley in place, clear yellow urine  PRIOR EXAMS: Neurological: Ox3- MAS of 2-3 in LE's MSK: Ue's 4/5 B/L throughout LE's- 2-/5 B/L-in HF/KE- 4-/5 DF/PF but RLE weaker than LLE   Spasticity- MAS of 1+ to 2 in Ue's this Am and 3 in RLE- MAS worse this AM- 2-3 throughout Neurologic:  Cranial nerves II through XII intact, motor strength is 4/5 in bilateral  bicep, tricep, grip,and finger ext and hand intrinsics, 3- RIght delt and 4/5 left delt, 3- bilateral  hip flexor, 3- RIght and 4- left knee extensors, 4/5 bilateral ankle dorsiflexor and plantar flexor Sensory exam normal sensation to light touch and proprioception in bilateral upper and lower extremities Tone- 3+ a bilateral biceps triceps and BR, + Hoffman's bilateral 2/5 RIght 3+ left knee Also MAS 3 tone in RIght Knee flexors and extensors, MAS 1 at the left knee Clonus at bilateral ankles  Musculoskeletal: Full range of motion in all 4 extremities. No joint swelling      Assessment/Plan: 1. Functional deficits which require 3+ hours per day of interdisciplinary therapy in a comprehensive inpatient rehab setting. Physiatrist is providing close team supervision and 24 hour management of active medical  problems listed below. Physiatrist and rehab team continue to assess barriers to discharge/monitor patient progress toward functional and medical goals  Care Tool:  Bathing              Bathing assist Assist Level: Dependent - Patient 0%     Upper Body Dressing/Undressing Upper body dressing        Upper body assist Assist Level: Moderate Assistance - Patient 50 - 74%    Lower Body Dressing/Undressing Lower body dressing      What is the patient wearing?: Pants     Lower body assist Assist for lower body dressing: Dependent - Patient 0%     Toileting Toileting    Toileting assist Assist for toileting: Dependent - Patient 0%     Transfers Chair/bed transfer  Transfers assist     Chair/bed transfer assist level: 2 Helpers     Locomotion Ambulation   Ambulation assist   Ambulation activity did not occur: Safety/medical concerns  Assist level: 2 helpers   Max distance: 5'   Walk 10 feet activity   Assist  Walk 10 feet activity did not occur: Safety/medical concerns        Walk 50 feet activity   Assist Walk 50 feet with 2 turns activity did not occur: Safety/medical concerns         Walk 150 feet activity   Assist Walk 150 feet activity did not occur: Safety/medical concerns         Walk 10 feet on uneven surface  activity   Assist Walk 10 feet on uneven surfaces activity did not occur: Safety/medical concerns         Wheelchair     Assist Is the patient using a wheelchair?: Yes Type of Wheelchair: Power    Wheelchair assist level: Supervision/Verbal cueing Max wheelchair distance: 150    Wheelchair 50 feet with 2 turns activity    Assist        Assist Level: Supervision/Verbal cueing   Wheelchair 150 feet activity     Assist      Assist Level: Supervision/Verbal cueing   Blood pressure (!) 122/43, pulse (!) 108, temperature 97.7 F (36.5 C), temperature source Oral, resp. rate 16, height 5'  4" (1.626 m), weight 60.6 kg, SpO2 95%.  Medical Problem List and Plan: 1. 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   Team conference today to f/u on progress  Will add low air loss mattress and prevalons for heels  2.  Antithrombotics: -DVT/anticoagulation:  Mechanical: Sequential compression devices, below knee Bilateral lower extremities 4/23- will see if Dr Ellery Guthrie will allow us  to start Lovenox - he said yes, so will start. 30mg  daily.             -antiplatelet therapy: N/A 3. Pain Management: tylenol  prn. Voltaren  QID, lyrica  50mg  BID, flexeril  PRN -10/24/23 leg muscle soreness likely from PT/OT this week; will start muscle rub cream 4/28- still c/o muscle soreness in legs- con't pain regimen as needed 4/30- still sore, but don't want Flexeril  since likely caused confusion- con't tylenol  prn- can use muscle rub which is ordered 5/1- denies a lot of pain- mainly sore form spasticity/working out 5/5- pain yesterday- not today-  4. Mood/Behavior/Sleep: LCSW to follow up for evaluation and support when appropriate --Delirium precautions--His mental status has cleared but will monitor for worsening with transfer to new unit .pt has not required prn meds for agitation in several days  Monitor sleep wake cycle             -antipsychotic agents: Change haldol  q 6 hrs prn to Seroquel  12.5mg  tid prn  -Restoril  7.5mg  nightly, cymbalta  20mg  daily 4/23-4/25 no delirium so far 4/28- having some delirium this AM? Not severe, but will monitor since has had in recent past 4/29- still confused this AM- but per nursing , was less confused overnight after Restoril  stopped- still pleasantly confused this AM when first woke  up- didn't open eyes.  4/30-5/3 Aox3 this AM 5. Neuropsych/cognition: This patient is usually  capable of making decisions on his own behalf. 6. Skin/Wound Care: Routine pressure relief measures. 7. Fluids/Electrolytes/Nutrition:  Monitor I/O. Routine labs. Continue vitamins/supplements. 8. 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  -10/23/23 foley replaced yesterday by Dr. Willye Harvey, per note, leave in place for 7-10d, Consider resuming In-N-Out catheterization for management of his neurogenic bladder at that time. Recommend using a coud catheter for intermittent catheterization (when resumed).  4/28- will leave foley for 7-10 days 10/31/23 foley likely ready to come out tomorrow? Placed 4/25 5/5- will d/c foley in AM after speaking with pt 5/6- pt wants ot do what wife wants- cannot reach wife to discuss foley removal- pt doesn't want to do cathing and sounds like would rather keep foley- but will discuss with him further 10.  Diabetes- A1c 8.0-  with peripheral neuropathy by hx only on Jardiance at home, use SSI in the hospital  -10/23/23 CBGs variable but likely d/t steroids; cont SSI for  now 4/29- CBG's very labile in last 24 hours  99-383- - it concerns me to start Jardiance since of Foley and risk of UTI's- also for starting Tradjenta  since BG was 99 last night- don't want to drop BG too much 4/30- Will add Tradjenta  since BG's still running 150-242- will monitor closely for CBG hypoglycemia-   10/30/23 CBGs looking great!  -10/31/23 CBGs a little higher the last day, but monitor for trend  5/5- Cbgs all less than 200- con't to monitor  5/6- CBGs 130s- 180s- CBG (last 3)  Recent Labs    11/01/23 1647 11/01/23 2045 11/02/23 0617  GLUCAP 135* 189* 129*     11.  Spasticity affecting LEs> UE and R>L , aggressive ROM  with PT, consider oral meds  4/22- starting baclofen  5 mg TID- with his Cr 1.50- has been running 1.68 to 2.30  4/24- will not increase baclofen  due to Renal issues 12.  PAD s/p SFA and Iliac stenting, continue metoprolol  50mg  daily  13. CKD3b? With AKI -4/22- Pt's Cr down to 1.50 from 2.29 2 days ago and 1.68 yesterday- will monitor 2x/week to see if will reduce further or be labile.   4/24- Cr 1.84- and BUN stable at 55- is drinking per pt  4/28- Cr 1.63- doing better 4/29- Cr 1.59 after 12+ hours of IVF's- and BUN very slightly down to 70 from 72-   5/1- Cr down to 1.43 and BUN down to 53  5/5- Cr 1.51 and BUN down to 37- drinking more! 14. Questionable neurogenic bowel: continue miralax  32g daily, senokot s 2 tabs qAM 4/24- required suppository 2 days ago however had BM on bedpan yesterday- thinks is mild.   4/25- smear that was incontinent last night -10/23/23 LBM last night per pt was very hard still, but documented as loose-- doesn't like miralax  (on 32g daily)-- will add colace 200mg  daily, see if this helps, if continued loose stools then would stop the miralax  first. Can mix miralax  with half juice/half water.  -10/24/23 looser stools yesterday, will stop miralax , leave colace-- pt prefers this  4/28- LBM 2 days ago  4/29- LBM small liquid stool 4/28- and 1 medium stool as well-   4/30- LBM  overnight  5/1- LBM this Am- was accident- might need bowel program 5/2- Placed on suppository nightly with possible dig stim- had multiple BM's during/after bowel program- if continues like this, will need dig stim.   -10/31/23 LBM last night x2 5/5- LBM last night with bowel program 5/6- BM last evening- no bowel note for last few nights seen 15. HLD: crestor  20mg  nightly 16. Delirium/Confusion 4/29- yesterday given IVF's for this- checked U/A and Cx- which showed mod leuks, however rate bacteria and very little WBC's 6-10- which is not a UTI in most cases- will d/w pharmacy-  -pt still pleasantly confused this AM- but didn't even open eyes, was so sleepy, so hard to tease out what is due to sleepiness and what is actual delirium- per nurse, much better last night-  4/30- pt Ox3 this AM- very with it- likely flexeril  based on chart review- given for pain since on no pain meds- will keep off Flexeril  and monitor Sx's  5/5- Sx's resolved 17. Hx of HTN with severe hypotension 4/30- pt's BP dropped to 79 systolic yesterday when sitting with OT- not clear if had TEDS/ACE wraps- will reduce Metoprolol  XL to 12.5 mg daily- from 50 mg daily and see if that helps- don't want ot add midodrine   quite yet.  5/1- Adding midodrine  5 mg TID and adding abd binder- couldn't tolerate ACE wraps at all- just TED's-   5/2- BP yesterday was less symptomatic- but still dropped some- con't regimen -5/3-4/25 BPs a bit better recently; monitor 5/6- BP doing better Vitals:   10/30/23 0625 10/30/23 1025 10/30/23 1336 10/30/23 1806  BP: (!) 113/52 (!) 106/40 (!) 115/44 (!) 133/57   10/31/23 0359 10/31/23 1335 10/31/23 1900 11/01/23 0440  BP: (!) 140/62 (!) 120/52 (!) 129/54 (!) 122/51   11/01/23 0837 11/01/23 1545 11/01/23 2009 11/02/23 0525  BP: (!) 137/58 (!) 133/55 (!) 144/51 (!) 122/43     18. Atelectasis 4/30- sounds coarse- asked pt to use ICS regularly- is at bedside- also described "spitting things up"_ had  "spit cup"- at bedside-  had barely anything in it. 5/5- not spitting up anymore  19. R heel DTI and Stage I on sacrum 5/2- is new- ordered prevalon boots as well as low air loss mattress and needs to be turned q2 hours- placed order 20. Spasticity  5/5- will d/w wife tomorrow about restarting Baclofen  vs Dantrolene.   5/6- attempted to call wife again x2- cannot get her  69. Severe malnutrition  5/5- per Dietitian- will do supplements  I spent a total of  57  minutes on total care today- >50% coordination of care- due to  D/w pt 2 different times- also calling wife multiple times to d/w her about foley and dantrolene vs Baclofen - also review of vitals, bowels and labs and team conference today to determine d/c date    LOS: 15 days A FACE TO FACE EVALUATION WAS PERFORMED  Shaniah Baltes 11/02/2023, 8:19 AM

## 2023-11-03 DIAGNOSIS — G959 Disease of spinal cord, unspecified: Secondary | ICD-10-CM | POA: Diagnosis not present

## 2023-11-03 LAB — GLUCOSE, CAPILLARY
Glucose-Capillary: 121 mg/dL — ABNORMAL HIGH (ref 70–99)
Glucose-Capillary: 173 mg/dL — ABNORMAL HIGH (ref 70–99)
Glucose-Capillary: 217 mg/dL — ABNORMAL HIGH (ref 70–99)
Glucose-Capillary: 99 mg/dL (ref 70–99)

## 2023-11-03 MED ORDER — TRAMADOL HCL 50 MG PO TABS
50.0000 mg | ORAL_TABLET | Freq: Three times a day (TID) | ORAL | Status: DC | PRN
  Filled 2023-11-03 (×3): qty 1

## 2023-11-03 MED ORDER — TRAMADOL HCL 50 MG PO TABS: 50.0000 mg | ORAL_TABLET | Freq: Four times a day (QID) | ORAL | Status: DC | PRN

## 2023-11-03 MED ORDER — BACLOFEN 5 MG HALF TABLET
5.0000 mg | ORAL_TABLET | Freq: Three times a day (TID) | ORAL | Status: DC
Start: 2023-11-03 — End: 2023-11-07
  Administered 2023-11-03 – 2023-11-06 (×12): 5 mg via ORAL
  Filled 2023-11-03 (×12): qty 1

## 2023-11-03 NOTE — Progress Notes (Signed)
 Physical Therapy Session Note  Patient Details  Name: Christian Bailey MRN: [id number] Date of Birth: 1940/07/25  Today's Date: 11/03/2023 PT Individual Time: 0901-0930 PT Individual Time Calculation (min): 29 min   Short Term Goals: Week 2:  PT Short Term Goal 1 (Week 2): Pt will be able to perform bed mobility with mod assist PT Short Term Goal 1 - Progress (Week 2): Not met PT Short Term Goal 2 (Week 2): Pt will be able to perform bed <> w/c transfer with mod assist PT Short Term Goal 2 - Progress (Week 2): Not met PT Short Term Goal 3 (Week 2): Pt will be able to tolerate standing for weightbearing x 5 min PT Short Term Goal 3 - Progress (Week 2): Not met PT Short Term Goal 4 (Week 2): Pt will initiate PWC mobility PT Short Term Goal 4 - Progress (Week 2): Met  Skilled Therapeutic Interventions/Progress Updates: Pt presents semi-reclined in bed asleep.  Pt arouses and agreeable to therapy.  Pt required total A for TED hose and then threading pants over feet as well as Foley bag through pant leg.  Pt able to roll side to side w/ mod/max A and hooklying position, once sidelying, able to hold self using rail and total A for pulling pants over hips.  Abd binder placed w/ rolling.  Pt required max A for sidelying to sit transfer and then able to maintain sitting w/ B UES.  Shoes donned w/ total A.  Pt transferred bed > PWC w/ max A, cues for forward lean and head/hips relationship.  Pt positioned self in reclined position for pressure relief, seat belt on.  Pt remained sitting w/ all needs in reach.     Therapy Documentation Precautions:  Precautions Precautions: Fall, Cervical Precaution/Restrictions Comments: decreased recall of precautions, not able to verbalize any when prompted Required Braces or Orthoses: Cervical Brace Cervical Brace: Soft collar Restrictions Weight Bearing Restrictions Per Provider Order: No General:   Vital Signs:   Pain:0/10    Therapy/Group: Individual  Therapy  Christian Bailey 11/03/2023, 11:17 AM

## 2023-11-03 NOTE — Progress Notes (Signed)
 Physical Therapy Session Note  Patient Details  Name: Christian Bailey MRN: [id number] Date of Birth: Nov 01, 1940  Today's Date: 11/03/2023 PT Individual Time: 1501-1535 PT Individual Time Calculation (min): 34 min   Short Term Goals: Week 3:  PT Short Term Goal 1 (Week 3): Pt will perform bed mobility with mod A PT Short Term Goal 2 (Week 3): Pt will progress transfers to max A PT Short Term Goal 3 (Week 3): Pt will be able to tolerate x 5 min in standing for WB/postural control retraining/tone management  Skilled Therapeutic Interventions/Progress Updates: Pt presented in PWC agreeable to therapy. Pt states pain becoming more controlled and "tightness" is getting better. Pt agreeable to try standing frame as per pt BP has been more stable recently. Pt navigated to day room with supervision and PTA did positioning to align into standing frame. Pt set up and tolerance standing frame for 6 min with pt asymptomatic throughout. While in standing pt was able to perform TKE initially with AA for full extension then improved to AROM. Once pt returned to sitting pt required maxA for posterior scoot into PWC. Pt navigated back to room in same manner as prior. In room pt required total A for .sbv set up but completed transfer back to bed with maxA and noted increased initiation and anterior weight shifting as compared last time with this therapist. Pt required maxA for sit to supine. Pt repositioned to comfort and left in bed with bed alarm on, call bell within reach and needs met.      Therapy Documentation Precautions:  Precautions Precautions: Fall, Cervical Precaution/Restrictions Comments: decreased recall of precautions, not able to verbalize any when prompted Required Braces or Orthoses: Cervical Brace Cervical Brace: Soft collar Restrictions Weight Bearing Restrictions Per Provider Order: No General:   Vital Signs: Therapy Vitals Temp: 97.7 F (36.5 C) Temp Source: Oral Pulse Rate: 91 Resp:  20 BP: (!) 121/54 Patient Position (if appropriate): Sitting Oxygen Therapy SpO2: 98 % O2 Device: Room Air  Therapy/Group: Individual Therapy  Cormac Wint 11/03/2023, 4:01 PM

## 2023-11-03 NOTE — Progress Notes (Signed)
 PROGRESS NOTE   Subjective/Complaints:  We discussed again about foley- pt wants it left in- doesn't want to do in/out cath because of false passage had 10 days ago- just wants foley and f/u with Urology.   Pt also reports SEVERE pain in RLE last night- finally ice helped the pain, but was wondering if there was anything that would help.   We discussed how a lot of it I think is spasticity- so baclofen  should hopefully help  Pain 1/10 this AM Admits forgets things form time to time- feels like brain 85% working.   ROS:    Pt denies SOB, abd pain, CP, N/V/C/D, and vision changes    Objective:   No results found.   Recent Labs    11/01/23 0546  WBC 6.4  HGB 8.4*  HCT 26.4*  PLT 222     Recent Labs    11/01/23 0546  NA 139  K 3.8  CL 101  CO2 31  GLUCOSE 192*  BUN 37*  CREATININE 1.51*  CALCIUM  8.4*      Intake/Output Summary (Last 24 hours) at 11/03/2023 0855 Last data filed at 11/03/2023 5784 Gross per 24 hour  Intake 692 ml  Output 1875 ml  Net -1183 ml     Pressure Injury 10/28/23 Sacrum Medial Stage 1 -  Intact skin with non-blanchable redness of a localized area usually over a bony prominence. STAGE 1 ON SACRUM (Active)  10/28/23 1333  Location: Sacrum  Location Orientation: Medial  Staging: Stage 1 -  Intact skin with non-blanchable redness of a localized area usually over a bony prominence.  Wound Description (Comments): STAGE 1 ON SACRUM  Present on Admission:      Pressure Injury 10/28/23 Heel Right Deep Tissue Pressure Injury - Purple or maroon localized area of discolored intact skin or blood-filled blister due to damage of underlying soft tissue from pressure and/or shear. DTI on bottom of heel (Active)  10/28/23 1820  Location: Heel  Location Orientation: Right  Staging: Deep Tissue Pressure Injury - Purple or maroon localized area of discolored intact skin or blood-filled blister due  to damage of underlying soft tissue from pressure and/or shear.  Wound Description (Comments): DTI on bottom of heel  Present on Admission:     Physical Exam: Vital Signs Blood pressure 125/61, pulse (!) 103, temperature 97.7 F (36.5 C), temperature source Oral, resp. rate 18, height 5\' 4"  (1.626 m), weight 60.6 kg, SpO2 96%.      General: awake, alert, appropriate, woke from sleep;  NAD HENT: conjugate gaze; oropharynx moist CV: regular rhythm, mildly tachycardic; no JVD Pulmonary: CTA B/L; no W/R/R- good air movement GI: soft, NT, ND, (+)BS Psychiatric: appropriate Neurological: Ox3  MAS of 2-3 in RLE  Skin: Heels covered with foam dressings B/L- R heel DTI and Sacral Stage I GU- foley in place, clear yellow urine  PRIOR EXAMS: Neurological: Ox3- MAS of 2-3 in LE's MSK: Ue's 4/5 B/L throughout LE's- 2-/5 B/L-in HF/KE- 4-/5 DF/PF but RLE weaker than LLE   Spasticity- MAS of 1+ to 2 in Ue's this Am and 3 in RLE- MAS worse this AM- 2-3 throughout Neurologic: Cranial nerves II through  XII intact, motor strength is 4/5 in bilateral  bicep, tricep, grip,and finger ext and hand intrinsics, 3- RIght delt and 4/5 left delt, 3- bilateral  hip flexor, 3- RIght and 4- left knee extensors, 4/5 bilateral ankle dorsiflexor and plantar flexor Sensory exam normal sensation to light touch and proprioception in bilateral upper and lower extremities Tone- 3+ a bilateral biceps triceps and BR, + Hoffman's bilateral 2/5 RIght 3+ left knee Also MAS 3 tone in RIght Knee flexors and extensors, MAS 1 at the left knee Clonus at bilateral ankles  Musculoskeletal: Full range of motion in all 4 extremities. No joint swelling      Assessment/Plan: 1. Functional deficits which require 3+ hours per day of interdisciplinary therapy in a comprehensive inpatient rehab setting. Physiatrist is providing close team supervision and 24 hour management of active medical problems listed below. Physiatrist  and rehab team continue to assess barriers to discharge/monitor patient progress toward functional and medical goals  Care Tool:  Bathing              Bathing assist Assist Level: Total Assistance - Patient < 25%     Upper Body Dressing/Undressing Upper body dressing   What is the patient wearing?: Pull over shirt    Upper body assist Assist Level: Moderate Assistance - Patient 50 - 74%    Lower Body Dressing/Undressing Lower body dressing      What is the patient wearing?: Underwear/pull up, Pants     Lower body assist Assist for lower body dressing: Dependent - Patient 0%     Toileting Toileting    Toileting assist Assist for toileting: Dependent - Patient 0%     Transfers Chair/bed transfer  Transfers assist     Chair/bed transfer assist level: 2 Helpers     Locomotion Ambulation   Ambulation assist   Ambulation activity did not occur: Safety/medical concerns  Assist level: 2 helpers Assistive device: Hand held assist Max distance: 15'   Walk 10 feet activity   Assist  Walk 10 feet activity did not occur: Safety/medical concerns  Assist level: 2 helpers Assistive device: Hand held assist   Walk 50 feet activity   Assist Walk 50 feet with 2 turns activity did not occur: Safety/medical concerns         Walk 150 feet activity   Assist Walk 150 feet activity did not occur: Safety/medical concerns         Walk 10 feet on uneven surface  activity   Assist Walk 10 feet on uneven surfaces activity did not occur: Safety/medical concerns         Wheelchair     Assist Is the patient using a wheelchair?: Yes Type of Wheelchair: Power    Wheelchair assist level: Supervision/Verbal cueing Max wheelchair distance: 200    Wheelchair 50 feet with 2 turns activity    Assist        Assist Level: Supervision/Verbal cueing   Wheelchair 150 feet activity     Assist      Assist Level: Supervision/Verbal cueing    Blood pressure 125/61, pulse (!) 103, temperature 97.7 F (36.5 C), temperature source Oral, resp. rate 18, height 5\' 4"  (1.626 m), weight 60.6 kg, SpO2 96%.  Medical Problem List and Plan: 1. Functional deficits secondary to Traumatic incomplete quadriplegia s/p C5-7 ACDF 10/12/23,  ASIA C/D- has R>L lower ext weakness as primary neurologic deficits             -patient may  shower             -  ELOS/Goals: Sup 14-16d   D/c 5/20- d/w wife at length 5/6 about plans along with SW  Con't CIR PT and OT  Will add low air loss mattress and prevalons for heels  2.  Antithrombotics: -DVT/anticoagulation:  Mechanical: Sequential compression devices, below knee Bilateral lower extremities 4/23- will see if Dr Ellery Guthrie will allow us  to start Lovenox - he said yes, so will start. 30mg  daily.             -antiplatelet therapy: N/A 3. Pain Management: tylenol  prn. Voltaren  QID, lyrica  50mg  BID, flexeril  PRN -10/24/23 leg muscle soreness likely from PT/OT this week; will start muscle rub cream 4/28- still c/o muscle soreness in legs- con't pain regimen as needed 4/30- still sore, but don't want Flexeril  since likely caused confusion- con't tylenol  prn- can use muscle rub which is ordered 5/1- denies a lot of pain- mainly sore form spasticity/working out 5/5- pain yesterday- not today-  5/7- will add Tramadol  50 mg q8 hours prn if pain won't go away otherwise-  due to renal function- doing TID prn- hopefully baclofen  helps pain in RLE as well 4. Mood/Behavior/Sleep: LCSW to follow up for evaluation and support when appropriate --Delirium precautions--His mental status has cleared but will monitor for worsening with transfer to new unit .pt has not required prn meds for agitation in several days  Monitor sleep wake cycle             -antipsychotic agents: Change haldol  q 6 hrs prn to Seroquel  12.5mg  tid prn  -Restoril  7.5mg  nightly, cymbalta  20mg  daily 4/23-4/25 no delirium so far 4/28- having some delirium  this AM? Not severe, but will monitor since has had in recent past 4/29- still confused this AM- but per nursing , was less confused overnight after Restoril  stopped- still pleasantly confused this AM when first woke up- didn't open eyes.  4/30-5/3 Aox3 this AM 5. Neuropsych/cognition: This patient is usually  capable of making decisions on his own behalf. 6. Skin/Wound Care: Routine pressure relief measures. 7. Fluids/Electrolytes/Nutrition:  Monitor I/O. Routine labs. Continue vitamins/supplements. 8. 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  -10/23/23 foley replaced yesterday by Dr. Willye Harvey, per note, leave in place for 7-10d, Consider resuming In-N-Out catheterization for management of his neurogenic bladder at that time. Recommend using a coud catheter for intermittent catheterization (when resumed).  4/28- will leave foley for 7-10 days 10/31/23 foley likely ready to come out tomorrow? Placed 4/25 5/5- will d/c foley in AM after speaking with pt 5/6- pt wants ot do what wife wants- cannot reach wife to discuss foley removal- pt doesn't want to do cathing and sounds like would rather keep foley- but will discuss with him  further 5/7- pt and wife have  decided they don't want ot remove foley - they don't want ot go back to in/out caths-  since wasn't emptying before- they want a chronic foley- when offered to remove foley just for rehab, to see if he gets return, he is scared will get bleeding again, due to false passage already had and delcined 10.  Diabetes- A1c 8.0-  with peripheral neuropathy by hx only on Jardiance at home, use SSI in the hospital  -10/23/23 CBGs variable but likely d/t steroids; cont SSI for now 4/29- CBG's very labile in last 24 hours 99-383- - it concerns me to start Jardiance since of Foley and risk of UTI's- also for starting Tradjenta  since BG was 99 last night- don't want to drop BG too much 4/30- Will add Tradjenta  since BG's still running 150-242- will monitor closely for CBG hypoglycemia-   10/30/23 CBGs looking great!  -10/31/23 CBGs a little higher the last day, but monitor for trend  5/5- Cbgs all less than 200- con't to monitor  5/6- CBGs 130s- 180s-  5/7- CBG's 128 to 220- had snack yesterday CBG (last 3)  Recent Labs    11/02/23 1653 11/02/23 2100 11/03/23 0605  GLUCAP 220* 128* 121*     11.  Spasticity affecting LEs> UE and R>L , aggressive ROM  with PT, consider oral meds  4/22- starting baclofen  5 mg TID- with his Cr 1.50- has been running 1.68 to 2.30  4/24- will not increase baclofen  due to Renal issues 12.  PAD s/p SFA and Iliac stenting, continue metoprolol  50mg  daily  13. CKD3b? With AKI -4/22- Pt's Cr down to 1.50 from 2.29 2 days ago and 1.68 yesterday- will monitor 2x/week to see if will reduce further or be labile.   4/24- Cr 1.84- and BUN stable at 55- is drinking per pt  4/28- Cr 1.63- doing better 4/29- Cr 1.59 after 12+ hours of IVF's- and BUN very slightly down to 70 from 72-   5/1- Cr down to 1.43 and BUN down to 53  5/5- Cr 1.51 and BUN down to 37- drinking more! 14. Questionable neurogenic bowel: continue miralax  32g daily, senokot s 2 tabs qAM 4/24-  required suppository 2 days ago however had BM on bedpan yesterday- thinks is mild.   4/25- smear that was incontinent last night -10/23/23 LBM last night per pt was very hard still, but documented as loose-- doesn't like miralax  (on 32g daily)-- will add colace 200mg  daily, see if this helps, if continued loose stools then would stop the miralax  first. Can mix miralax  with half juice/half water.  -10/24/23 looser stools yesterday, will stop miralax , leave colace-- pt prefers this  4/28- LBM 2 days ago  4/29- LBM small liquid stool 4/28- and 1 medium stool as well-   4/30- LBM overnight  5/1- LBM this Am- was accident- might need bowel program 5/2- Placed on suppository nightly with possible dig stim- had multiple BM's during/after bowel program- if continues like this, will need dig stim.   -10/31/23 LBM last night x2 5/5- LBM last night with bowel program 5/6- BM last evening- no bowel note for last few nights seen 5/7- LBM 2 nights ago- no bowel note- d/w nursing coordinator 15. HLD: crestor  20mg  nightly 16. Delirium/Confusion 4/29- yesterday given IVF's for this- checked U/A and Cx- which showed mod leuks, however rate bacteria and very little WBC's 6-10- which is not a UTI in most cases- will d/w pharmacy-  -pt still pleasantly confused this AM- but didn't even  open eyes, was so sleepy, so hard to tease out what is due to sleepiness and what is actual delirium- per nurse, much better last night-  4/30- pt Ox3 this AM- very with it- likely flexeril  based on chart review- given for pain since on no pain meds- will keep off Flexeril  and monitor Sx's  5/5- Sx's resolved 17. Hx of HTN with severe hypotension 4/30- pt's BP dropped to 79 systolic yesterday when sitting with OT- not clear if had TEDS/ACE wraps- will reduce Metoprolol  XL to 12.5 mg daily- from 50 mg daily and see if that helps- don't want ot add midodrine  quite yet.  5/1- Adding midodrine  5 mg TID and adding abd binder- couldn't  tolerate ACE wraps at all- just TED's-   5/2- BP yesterday was less symptomatic- but still dropped some- con't regimen -5/3-4/25 BPs a bit better recently; monitor 5/6-5/7 BP doing better Vitals:   10/31/23 0359 10/31/23 1335 10/31/23 1900 11/01/23 0440  BP: (!) 140/62 (!) 120/52 (!) 129/54 (!) 122/51   11/01/23 0837 11/01/23 1545 11/01/23 2009 11/02/23 0525  BP: (!) 137/58 (!) 133/55 (!) 144/51 (!) 122/43   11/02/23 0843 11/02/23 1250 11/02/23 2013 11/03/23 0505  BP: (!) 128/52 (!) 121/52 (!) 117/53 125/61     18. Atelectasis 4/30- sounds coarse- asked pt to use ICS regularly- is at bedside- also described "spitting things up"_ had "spit cup"- at bedside-  had barely anything in it. 5/5- not spitting up anymore  19. R heel DTI and Stage I on sacrum 5/2- is new- ordered prevalon boots as well as low air loss mattress and needs to be turned q2 hours- placed order 20. Spasticity  5/5- will d/w wife tomorrow about restarting Baclofen  vs Dantrolene.   5/6- attempted to call wife again x2- cannot get her 5/7- was able to see wife yesterday and discussed- we will retry Pt on Baclofen  5 mg TID for now- not a higher dose due to renal issues-  and monitor for confusion/delirium- if gets confused, will try Dantrolene.   21. Severe malnutrition  5/5- per Dietitian- will do supplements  I spent a total of  38  minutes on total care today- >50% coordination of care- due to  D/w nursing about bowel program, about foley and pain   LOS: 16 days A FACE TO FACE EVALUATION WAS PERFORMED  Azeneth Carbonell 11/03/2023, 8:55 AM

## 2023-11-03 NOTE — Patient Care Conference (Signed)
 Inpatient RehabilitationTeam Conference and Plan of Care Update Date: 11/02/2023   Time: 1132 am    Patient Name: Christian Bailey      Medical Record Number: [id number]  Date of Birth: 1941/01/19 Sex: Male         Room/Bed: 4M03C/4M03C-01 Payor Info: Payor: MEDICARE / Plan: MEDICARE PART A AND B / Product Type: *No Product type* /    Admit Date/Time:  10/18/2023  3:33 PM  Primary Diagnosis:  Acute incomplete quadriplegia [facility])  Hospital Problems: Principal Problem:   Acute incomplete quadriplegia (HCC) Active Problems:   Cervical myelopathy (HCC)   Urinary retention   Neurogenic bladder   Transient iatrogenic urethral bleeding   Depression with anxiety   Adjustment disorder with mixed anxiety and depressed mood   Protein-calorie malnutrition, severe    Expected Discharge Date: Expected Discharge Date: 11/16/23 (pending)  Team Members Present: Physician leading conference: Dr. Celia Coles Social Worker Present: Adrianna Albee, LCSW Nurse Present: Jerene Monks, RN PT Present: Gita Lamb, PT OT Present: Nila Barth, OT PPS Coordinator present : Jestine Moron, SLP     Current Status/Progress Goal Weekly Team Focus  Bowel/Bladder   Continent with bowel, with foley for bladder  Bowel program bowel movement without the need of suppository or removal of foley  In and out cath for home  to remain continent  Educate wife/caregiver for in and out caths  Swallow/Nutrition/ Hydration               ADL's   total A overall for ADLs, Max A +1 for lateral transfers with board, declining bathing tasks at shower level, Min A sitting balance   downgraded to Mod A   increase UB strength/endurance, dynamic sitting, start family ed    Mobility   max to total A for bed mobility and slideboard transfers, +2 for standing or use of lift equipment, supervision to CGA with introduction of PWC for mobility   goals were downgraded to min/mod A w/c level transfers and superivision PWC  mobility; gait and stair goals were discharged last week  spasticity/tone management, transfers, NMR for postural control retraining and BLE WB/strengthening, hands on family education/training    Communication                Safety/Cognition/ Behavioral Observations               Pain   denies pain   pain free   deep breathing exercises    Skin   healing Stage I buttocks DTI right heel free from infection  wound care Assess skin q shift    Discharge Planning:  Family conference held last week and plan is for home with wife being primary caregiver and son's supplementing and possibly hiring assist.    Team Discussion: Patient was admitted post C5-7 ACDF secondary to Traumatic incomplete quadriplegia. Patient limited by pain , spasticity, weakness  of bilateral shoulder and poor balance of bilateral lower extremities.  Patient on target to meet rehab goals: no, Patient requires total A overall with ADLs. Patient requires max assistance for transfers using stedy or slide board due to spasticity/tone. Goals for discharge are set for mod A wheelchair level. Patient is not progressing with goals.    See Care Plan and progress notes for long and short-term goals.   Revisions to Treatment Plan:  Wound plan initiated  Prevalon boots on bilateral heels Foam dressings on both heels Family Conference Downgraded goals Slide board Stedy Foley catheter  Teaching Needs: Safety,  medications, transfers, toileting, etc.   Current Barriers to Discharge: Decreased caregiver support, Home enviroment access/layout, Neurogenic bowel and bladder, Wound care, and Weight bearing restrictions  Possible Resolutions to Barriers: Family Education     Medical Summary Current Status: pt is total A of 2; incontinent of bowel and bladder- has foley- wants to see needs to cath vs keep foley in- need ot speak with wife.  Barriers to Discharge: Behavior/Mood;Complicated Wound;Other  (comments);Self-care education;Spasticity;Weight bearing restrictions;Incontinence;Neurogenic Bowel & Bladder;Hypotension;Medical stability;Renal Insufficiency/Failure;Uncontrolled Pain  Barriers to Discharge Comments: limited by hypotension- spasticity; DTI R heel- not really progressing; Stage 1 sacrum; total A of 2; RLE pain Possible Resolutions to [employer] Focus: will see if can get foley- will d/c Dispo with pt/wife/son- d/c 5/20   Continued Need for Acute Rehabilitation Level of Care: The patient requires daily medical management by a physician with specialized training in physical medicine and rehabilitation for the following reasons: Direction of a multidisciplinary physical rehabilitation program to maximize functional independence : Yes Medical management of patient stability for increased activity during participation in an intensive rehabilitation regime.: Yes Analysis of laboratory values and/or radiology reports with any subsequent need for medication adjustment and/or medical intervention. : Yes   I attest that I was present, lead the team conference, and concur with the assessment and plan of the team.   Jerene Monks 11/02/2023, 1132 am

## 2023-11-03 NOTE — Progress Notes (Signed)
 Patient ID: Christian Bailey, male   DOB: 1941-04-23, 83 y.o.   MRN: [id number]  IP Rehab Bowel Program Documentation   Bowel Program Start time 1840  Dig Stim Indicated? No  Dig Stim Prior to Suppository or mini Enema X 0   Output from dig stim:  Ordered intervention: Suppository Yes , mini enema No ,   Repeat dig stim after Suppository or Mini enema  X 1,  Output? Very large   Bowel Program Complete? Yes , handoff given   Patient Tolerated? Yes

## 2023-11-03 NOTE — Progress Notes (Signed)
 Occupational Therapy Session Note  Patient Details  Name: Christian Bailey MRN: [id number] Date of Birth: 31-Dec-1940  Today's Date: 11/03/2023 OT Individual Time: 7253-6644 & 1330-1430 OT Individual Time Calculation (min): 60 min & 60 min   Short Term Goals: Week 3:  OT Short Term Goal 1 (Week 3): Pt will complete dynamic sitting with SBA in preparation for ADL tasks OT Short Term Goal 2 (Week 3): Pt will complete bed mobility with Mod A in preparation for ADLs OT Short Term Goal 3 (Week 3): Pt will complete UB dressing at Min A consistently throughout sessions  Skilled Therapeutic Interventions/Progress Updates:      Therapy Documentation Precautions:  Precautions Precautions: Fall, Cervical Precaution/Restrictions Comments: decreased recall of precautions, not able to verbalize any when prompted Required Braces or Orthoses: Cervical Brace Cervical Brace: Soft collar Restrictions Weight Bearing Restrictions Per Provider Order: No Session 1 General: "I feel better today" Pt seated in W/C upon OT arrival and asleep, easily aroused, agreeable to OT.  Pain: no pain reported, pt said legs feel better today  Exercises: Pt completed the following exercise circuit in order to improve functional activity, core strength, global strength/ endurance to prepare for ADLs such as dressing. Pt completed the following exercises in perched position within stedy and no noted LOB/SOB and various repetitions on each exercise: -bicep curls with ball 3x10 -triceps extensions with ball 3x10 -sit to stands within stedy from perched position 3x5 -throwing/catching ball -reaching out of BOS with BUE to organize tray table, no LOB  Pt requiring rest breaks after trials of activities for conservation of energy.   OT also assisting with multiple sit to stand transfers from W/C, Max A +1 from low surface from W/C>stedy with VC for increased push through BLE.    Pt seated in W/C in tilt position for pressure  relief at end of session with W/C alarm donned, call light within reach and 4Ps assessed.    Session 2 General: "I had a good day today!" Pt seated in W/C upon OT arrival, agreeable to OT.  Pain: no pain reported  Exercises: OT providing skilled intervention for increase dynamic balance strategies, PWC mobility and visual scanning. Pt propelled with PWC throughout therapy gym with SBA complete scavenger hunt to retrieve items with reacher. Pt able to retrieve washcloths from various heights with reacher, even able to reach to floor with Min A with reacher.  Pt retrieved wash clothes with VC to locate 3/9 items. OT providing VC for W/C safety management for turning off PWC when reaching for safety. OT also providing intervention with W/C mobility with weaving in/out of cones with SBA in PWC with multiple errors noted with running into cones. OT educated pt on W/C being mid-wheel drive, making 0 degree turns. OT noted increased independence with task after education.    Other Treatments: OT providing assistance with transfer from PWC><mat table Max A +1 although noted increased anterior lean for increased buttocks clearance. OT providing intervention for increased core strength, dynamic seated balance and reaching out of BOS for increased ADL participation. Pt seated SBA on mat table using BUE to fold washcloths, able to anterior lean out of BOS without LOB for multiple trials.    Pt seated in W/C at end of session with W/C alarm donned, call light within reach and 4Ps assessed.      Therapy/Group: Individual Therapy  Nila Barth, OTD, OTR/L 11/03/2023, 4:16 PM

## 2023-11-04 DIAGNOSIS — G959 Disease of spinal cord, unspecified: Secondary | ICD-10-CM | POA: Diagnosis not present

## 2023-11-04 LAB — GLUCOSE, CAPILLARY
Glucose-Capillary: 130 mg/dL — ABNORMAL HIGH (ref 70–99)
Glucose-Capillary: 141 mg/dL — ABNORMAL HIGH (ref 70–99)
Glucose-Capillary: 148 mg/dL — ABNORMAL HIGH (ref 70–99)
Glucose-Capillary: 235 mg/dL — ABNORMAL HIGH (ref 70–99)

## 2023-11-04 LAB — BASIC METABOLIC PANEL WITH GFR
Anion gap: 8 (ref 5–15)
BUN: 34 mg/dL — ABNORMAL HIGH (ref 8–23)
CO2: 28 mmol/L (ref 22–32)
Calcium: 8.7 mg/dL — ABNORMAL LOW (ref 8.9–10.3)
Chloride: 102 mmol/L (ref 98–111)
Creatinine, Ser: 1.8 mg/dL — ABNORMAL HIGH (ref 0.61–1.24)
GFR, Estimated: 37 mL/min — ABNORMAL LOW (ref >60)
Glucose, Bld: 136 mg/dL — ABNORMAL HIGH (ref 70–99)
Potassium: 4 mmol/L (ref 3.5–5.1)
Sodium: 138 mmol/L (ref 135–145)

## 2023-11-04 NOTE — Progress Notes (Signed)
 Recreational Therapy Session Note  Patient Details  Name: Christian Bailey MRN: [id number] Date of Birth: [birth year] Today's Date: 11/04/2023  Goal:  Pt will actively participate in 60 minute therapeutic dance group with supervision.  MET Group Description: Dance Group: Pt participated in dance group with an emphasis on social interaction, motor planning, increasing overall activity tolerance and bimanual tasks. All songs were selected by group members. Dance moves included AROM of BUE/BLE gross motor movements with an emphasis on building functional endurance.      Individual level documentation: Patient completed group from sitting level in power w/c.  Patient needed supervision to complete various dance moves with cues for adherence to precautions.  Patient needed minimal modifications during group but initiated modifications with mod I.  Pt often "free-styled" when he was unable to do demonstrated dance moves.  Pt often singing and laughing during group.    Pain:  no c/o Triana Coover 11/04/2023, 4:11 PM

## 2023-11-04 NOTE — Group Note (Signed)
 Patient Details Name: Bingham Picciotto MRN: [id number] DOB: 1940-08-26 Today's Date: 11/04/2023  Time Calculation: OT Group Time Calculation OT Group Start Time: 1430 OT Group Stop Time: 1530 OT Group Time Calculation (min): 60 min      Group Description: Dance Group: Pt participated in dance group with an emphasis on social interaction, motor planning, increasing overall activity tolerance and bimanual tasks. All songs were selected by group members. Dance moves included AROM of BUE/BLE gross motor movements with an emphasis on building functional endurance.    Individual level documentation: Patient completed group from sitting level. Patientt needed supervision to complete various dance moves with cues to maintain precautions.  Patient needed min modifications during group.  Pain: Pain Assessment Pain Scale: 0-10 Pain Score: 4  Pain Location: Groin Pain Intervention(s): Medication (See eMAR)  Precautions:  Cervical, soft collar   Geoffery Kiel 11/04/2023, 3:57 PM

## 2023-11-04 NOTE — NC FL2 (Signed)
 [place]  MEDICAID FL2 LEVEL OF CARE FORM     IDENTIFICATION  Patient Name: Christian Bailey Birthdate: 11/16/1940 Sex: male Admission Date (Current Location): 10/18/2023  [facility] and [place] Number:  Producer, television/film/video and Address:  [facility] [place], [address], [place], [place] [address]      Provider Number: [id number]  Attending Physician Name and Address:  Celia Coles, MD  Relative Name and Phone Number:  Theotis Alavi wife [phone]    Current Level of Care: Hospital Recommended Level of Care: Skilled Nursing Facility Prior Approval Number:    Date Approved/Denied:   PASRR Number: [id number]  Discharge Plan: SNF    Current Diagnoses: Patient Active Problem List   Diagnosis Date Noted   Protein-calorie malnutrition, severe 11/01/2023   Adjustment disorder with mixed anxiety and depressed mood 10/28/2023   Depression with anxiety 10/25/2023   Urinary retention 10/22/2023   Neurogenic bladder 10/22/2023   Transient iatrogenic urethral bleeding 10/22/2023   Acute incomplete quadriplegia (HCC) 10/19/2023   Spinal cord compression (HCC) 10/18/2023   Unsteady gait 10/18/2023   Neck pain 10/18/2023   Cervical myelopathy (HCC) 10/18/2023   Delirium due to another medical condition, acute, hyperactive 10/09/2023   Intractable back pain 10/03/2023   Fall 10/02/2023   Acute renal failure superimposed on stage 3b chronic kidney disease, unspecified acute renal failure type (HCC) 05/07/2023   Leukocytosis 05/07/2023   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) 03/25/2020   Exudative age-related macular degeneration of right eye with inactive choroidal neovascularization (HCC) 03/25/2020   Advanced nonexudative age-related macular degeneration of right eye with subfoveal involvement 03/25/2020   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  involvement 03/25/2020   Bacteremia due to Gram-negative bacteria 01/19/2019   CKD (chronic kidney disease) stage 3, GFR 30-59 ml/min (HCC) 01/19/2019   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) 07/06/2018   S/P shoulder replacement, right 12/07/2016   PVD (peripheral vascular disease) (HCC) 09/18/2016   Sinus tachycardia 05/15/2014   Carotid artery disease (HCC) 04/26/2014   PAD (peripheral artery disease) (HCC) 06/12/2013   RBBB (right bundle branch block with left anterior fascicular block) 06/12/2013   Claudication (HCC) 12/14/2012   Essential hypertension 12/14/2012   Hyperlipidemia 12/14/2012   Type 2 diabetes mellitus (HCC) 12/14/2012   History of colonic polyps 01/25/2012   DM 08/11/2010   Acute duodenal ulcer with hemorrhage 08/11/2010    Orientation RESPIRATION BLADDER Height & Weight     Self, Time, Situation, Place  Normal Indwelling catheter Weight: 133 lb 9.6 oz (60.6 kg) Height:  5\' 4"  (162.6 cm)  BEHAVIORAL SYMPTOMS/MOOD NEUROLOGICAL BOWEL NUTRITION STATUS      Continent Diet (regular thin liquids)  AMBULATORY STATUS COMMUNICATION OF NEEDS Skin   Total Care Verbally PU Stage and Appropriate Care PU Stage 1 Dressing: Daily                     Personal Care Assistance Level of Assistance  Bathing, Dressing Bathing Assistance: Limited assistance   Dressing Assistance: Limited assistance     Functional Limitations Info  Sight Sight Info: Impaired (Glasses)        SPECIAL CARE FACTORS FREQUENCY  PT (By licensed PT), OT (By licensed OT), Bowel and bladder program     PT Frequency: 5x week OT Frequency: 5x week Bowel and Bladder Program Frequency: Bowel program  may need bladder program if take out foley  Contractures Contractures Info: Not present    Additional Factors Info  Code Status, Allergies Code Status Info: DNR Allergies Info: Aspirian, Gadolinium, Iodinated contrast, Pneunoccal caccine, Scopolamine ,  Oxycodone            Current Medications (11/04/2023):  This is the current hospital active medication list Current Facility-Administered Medications  Medication Dose Route Frequency Provider Last Rate Last Admin   (feeding supplement) PROSource Plus liquid 30 mL  30 mL Oral Daily Lovorn, Megan, MD   30 mL at 11/04/23 0855   acetaminophen  (TYLENOL ) tablet 325-650 mg  325-650 mg Oral Q4H PRN Love, Pamela S, PA-C   650 mg at 11/04/23 1305   alum & mag hydroxide-simeth (MAALOX/MYLANTA) 200-200-20 MG/5ML suspension 30 mL  30 mL Oral Q4H PRN Love, Pamela S, PA-C       baclofen  (LIORESAL ) tablet 5 mg  5 mg Oral TID Lovorn, Megan, MD   5 mg at 11/04/23 1304   bisacodyl  (DULCOLAX) suppository 10 mg  10 mg Rectal Daily Lovorn, Megan, MD   10 mg at 11/03/23 1738   Chlorhexidine  Gluconate Cloth 2 % PADS 6 each  6 each Topical Q12H Lovorn, Megan, MD   6 each at 11/04/23 0531   diclofenac  Sodium (VOLTAREN ) 1 % topical gel 2 g  2 g Topical QID Love, Pamela S, PA-C   2 g at 11/04/23 1220   diphenhydrAMINE  (BENADRYL ) capsule 25 mg  25 mg Oral Q6H PRN Love, Pamela S, PA-C   25 mg at 10/28/23 2023   docusate sodium  (COLACE) capsule 200 mg  200 mg Oral Daily Street, [place], PA-C   200 mg at 11/04/23 1610   DULoxetine  (CYMBALTA ) DR capsule 20 mg  20 mg Oral Daily Love, Pamela S, PA-C   20 mg at 11/04/23 9604   enoxaparin  (LOVENOX ) injection 30 mg  30 mg Subcutaneous Q24H Amend, Caron G, RPH   30 mg at 11/04/23 1106   feeding supplement (ENSURE ENLIVE / ENSURE PLUS) liquid 237 mL  237 mL Oral BID BM Lovorn, Megan, MD   237 mL at 11/02/23 1402   ferrous sulfate  tablet 325 mg  325 mg Oral Q breakfast Love, Pamela S, PA-C   325 mg at 11/04/23 5409   Gerhardt's butt cream   Topical TID Lovorn, Megan, MD   Given at 11/04/23 1305   guaiFENesin -dextromethorphan  (ROBITUSSIN DM) 100-10 MG/5ML syrup 5-10 mL  5-10 mL Oral Q6H PRN Love, Pamela S, PA-C       insulin  aspart (novoLOG ) injection 0-5 Units  0-5 Units Subcutaneous  QHS Love, Pamela S, PA-C   2 Units at 10/30/23 2057   insulin  aspart (novoLOG ) injection 0-9 Units  0-9 Units Subcutaneous TID WC Love, Pamela S, PA-C   1 Units at 11/04/23 1219   lidocaine  (XYLOCAINE ) 2 % jelly   Topical PRN Love, Pamela S, PA-C   1 Application at 10/21/23 1809   linagliptin  (TRADJENTA ) tablet 5 mg  5 mg Oral Daily Lovorn, Megan, MD   5 mg at 11/04/23 8119   melatonin tablet 5 mg  5 mg Oral QHS PRN Love, Pamela S, PA-C   5 mg at 11/03/23 2101   menthol -cetylpyridinium (CEPACOL) lozenge 3 mg  1 lozenge Oral PRN Love, Pamela S, PA-C       Or   phenol (CHLORASEPTIC) mouth spray 1 spray  1 spray Mouth/Throat PRN Love, Pamela S, PA-C       metoprolol  succinate (TOPROL -XL) 24 hr tablet 12.5 mg  12.5 mg Oral Daily Lovorn,  Megan, MD   12.5 mg at 11/04/23 1610   midodrine  (PROAMATINE ) tablet 5 mg  5 mg Oral TID WC Lovorn, Megan, MD   5 mg at 11/04/23 1307   multivitamin with minerals tablet 1 tablet  1 tablet Oral Daily Lovorn, Megan, MD   1 tablet at 11/04/23 9604   Muscle Rub CREA   Topical PRN Street, [place], PA-C   Given at 11/02/23 2301   pantoprazole  (PROTONIX ) EC tablet 40 mg  40 mg Oral BID Love, Pamela S, PA-C   40 mg at 11/04/23 5409   pregabalin  (LYRICA ) capsule 50 mg  50 mg Oral BID Love, Pamela S, PA-C   50 mg at 11/04/23 8119   prochlorperazine  (COMPAZINE ) tablet 5-10 mg  5-10 mg Oral Q6H PRN Love, Pamela S, PA-C       Or   prochlorperazine  (COMPAZINE ) suppository 12.5 mg  12.5 mg Rectal Q6H PRN Love, Pamela S, PA-C       Or   prochlorperazine  (COMPAZINE ) injection 5-10 mg  5-10 mg Intravenous Q6H PRN Love, Pamela S, PA-C       rosuvastatin  (CRESTOR ) tablet 20 mg  20 mg Oral QPM Love, Pamela S, PA-C   20 mg at 11/03/23 1738   senna-docusate (Senokot-S) tablet 2 tablet  2 tablet Oral Q breakfast Zelda Hickman, PA-C   2 tablet at 11/04/23 1478   sodium phosphate  (FLEET) enema 1 enema  1 enema Rectal Once PRN Love, Pamela S, PA-C       tamsulosin  (FLOMAX ) capsule 0.4 mg   0.4 mg Oral QPC supper Lovorn, Megan, MD   0.4 mg at 11/03/23 1738   traMADol  (ULTRAM ) tablet 50 mg  50 mg Oral Q8H PRN Lovorn, Megan, MD         Discharge Medications: Please see discharge summary for a list of discharge medications.  Relevant Imaging Results:  Relevant Lab Results:   Additional Information [id number] Needs short term rehab to get more function  Vieva Brummitt, Maralee Senate, LCSW

## 2023-11-04 NOTE — Progress Notes (Signed)
 Physical Therapy Session Note  Patient Details  Name: Christian Bailey MRN: [id number] Date of Birth: [birth year]  Today's Date: 11/04/2023 PT Individual Time: 1355-1435 PT Individual Time Calculation (min): 40 min   Short Term Goals: Week 3:  PT Short Term Goal 1 (Week 3): Pt will perform bed mobility with mod A PT Short Term Goal 2 (Week 3): Pt will progress transfers to max A PT Short Term Goal 3 (Week 3): Pt will be able to tolerate x 5 min in standing for WB/postural control retraining/tone management  Skilled Therapeutic Interventions/Progress Updates:    Session focused on PWC mobility on unit and adjusting controls for positioning changes to improve overall independence with w/c at supervision level. NMR to address postural control retraining, BLE strengthening and WB for spasticity management, and gait with focus on reciprocal movement pattern retraining. Pt performed 3 trials with about 10' each trial and seated breaks between due to fatigue. Pt required mod +2 for sit <> stands with assist to adjust height of Eva walker for tolerance to shoulder limitations, cues for anterior weightshift, up to max assist at times for RLE management and placement especially as fatigues or increased adduction tone noted. Repositioned in w/c with gait belt around BLE for alignment and scooted back into w/c with =2 assist. Drove PWC to other side of unit with supervision to set up for Dance Group with OT.  Therapy Documentation Precautions:  Precautions Precautions: Fall, Cervical Precaution/Restrictions Comments: decreased recall of precautions, not able to verbalize any when prompted Required Braces or Orthoses: Cervical Brace Cervical Brace: Soft collar Restrictions Weight Bearing Restrictions Per Provider Order: No  Pain: C/o pain in BLE and groin today - repositioned as able for relief.   Therapy/Group: Individual Therapy  Gita Lamb [name], PT, DPT, CBIS  11/04/2023, 3:17 PM

## 2023-11-04 NOTE — Progress Notes (Signed)
 PROGRESS NOTE   Subjective/Complaints:  Pt reports dizzy when woke him up- but felt better before I left the room.  Pain MUCH better- is mild- didn't need tramadol  yesterday thinks baclofen  really helped.  LBM last evening with bowel program- no dig stim done.  Denies confusion.  Said air mattress didn't work- very painful and hard to transfer.    Cr up to 1.8 and BUN 34 ROS:   Pt denies SOB, abd pain, CP, N/V/C/D, and vision changes   Objective:   No results found.   No results for input(s): "WBC", "HGB", "HCT", "PLT" in the last 72 hours.    Recent Labs    11/04/23 0611  NA 138  K 4.0  CL 102  CO2 28  GLUCOSE 136*  BUN 34*  CREATININE 1.80*  CALCIUM  8.7*      Intake/Output Summary (Last 24 hours) at 11/04/2023 1610 Last data filed at 11/04/2023 0700 Gross per 24 hour  Intake 318 ml  Output 1600 ml  Net -1282 ml     Pressure Injury 10/28/23 Sacrum Medial Stage 1 -  Intact skin with non-blanchable redness of a localized area usually over a bony prominence. STAGE 1 ON SACRUM (Active)  10/28/23 1333  Location: Sacrum  Location Orientation: Medial  Staging: Stage 1 -  Intact skin with non-blanchable redness of a localized area usually over a bony prominence.  Wound Description (Comments): STAGE 1 ON SACRUM  Present on Admission:      Pressure Injury 10/28/23 Heel Right Deep Tissue Pressure Injury - Purple or maroon localized area of discolored intact skin or blood-filled blister due to damage of underlying soft tissue from pressure and/or shear. DTI on bottom of heel (Active)  10/28/23 1820  Location: Heel  Location Orientation: Right  Staging: Deep Tissue Pressure Injury - Purple or maroon localized area of discolored intact skin or blood-filled blister due to damage of underlying soft tissue from pressure and/or shear.  Wound Description (Comments): DTI on bottom of heel  Present on Admission:      Physical Exam: Vital Signs Blood pressure 134/67, pulse (!) 107, temperature 97.6 F (36.4 C), temperature source Oral, resp. rate 18, height 5\' 4"  (1.626 m), weight 60.6 kg, SpO2 96%.       General: awake, alert, appropriate,  just woke up- c/o dizziness, improved by time left room; NAD HENT: conjugate gaze; oropharynx moist CV: regular rhythm, tachycardic rate; no JVD Pulmonary: CTA B/L; no W/R/R- good air movement GI: soft, NT, ND, (+)BS Psychiatric: appropriate- bright Neurological: Ox3 MAS of 1+ to 2 in RLE- better this AM  Skin: Heels covered with foam dressings B/L- R heel DTI and Sacral Stage I GU- foley in place, clear yellow urine  PRIOR EXAMS: Neurological: Ox3- MAS of 2-3 in LE's MSK: Ue's 4/5 B/L throughout LE's- 2-/5 B/L-in HF/KE- 4-/5 DF/PF but RLE weaker than LLE   Spasticity- MAS of 1+ to 2 in Ue's this Am and 3 in RLE- MAS worse this AM- 2-3 throughout Neurologic: Cranial nerves II through XII intact, motor strength is 4/5 in bilateral  bicep, tricep, grip,and finger ext and hand intrinsics, 3- RIght delt and 4/5 left delt, 3-  bilateral  hip flexor, 3- RIght and 4- left knee extensors, 4/5 bilateral ankle dorsiflexor and plantar flexor Sensory exam normal sensation to light touch and proprioception in bilateral upper and lower extremities Tone- 3+ a bilateral biceps triceps and BR, + Hoffman's bilateral 2/5 RIght 3+ left knee Also MAS 3 tone in RIght Knee flexors and extensors, MAS 1 at the left knee Clonus at bilateral ankles  Musculoskeletal: Full range of motion in all 4 extremities. No joint swelling      Assessment/Plan: 1. Functional deficits which require 3+ hours per day of interdisciplinary therapy in a comprehensive inpatient rehab setting. Physiatrist is providing close team supervision and 24 hour management of active medical problems listed below. Physiatrist and rehab team continue to assess barriers to discharge/monitor patient  progress toward functional and medical goals  Care Tool:  Bathing              Bathing assist Assist Level: Total Assistance - Patient < 25%     Upper Body Dressing/Undressing Upper body dressing   What is the patient wearing?: Pull over shirt    Upper body assist Assist Level: Moderate Assistance - Patient 50 - 74%    Lower Body Dressing/Undressing Lower body dressing      What is the patient wearing?: Underwear/pull up, Pants     Lower body assist Assist for lower body dressing: Dependent - Patient 0%     Toileting Toileting    Toileting assist Assist for toileting: Dependent - Patient 0%     Transfers Chair/bed transfer  Transfers assist     Chair/bed transfer assist level: Maximal Assistance - Patient 25 - 49%     Locomotion Ambulation   Ambulation assist   Ambulation activity did not occur: Safety/medical concerns  Assist level: 2 helpers Assistive device: Hand held assist Max distance: 15'   Walk 10 feet activity   Assist  Walk 10 feet activity did not occur: Safety/medical concerns  Assist level: 2 helpers Assistive device: Hand held assist   Walk 50 feet activity   Assist Walk 50 feet with 2 turns activity did not occur: Safety/medical concerns         Walk 150 feet activity   Assist Walk 150 feet activity did not occur: Safety/medical concerns         Walk 10 feet on uneven surface  activity   Assist Walk 10 feet on uneven surfaces activity did not occur: Safety/medical concerns         Wheelchair     Assist Is the patient using a wheelchair?: Yes Type of Wheelchair: Power    Wheelchair assist level: Supervision/Verbal cueing Max wheelchair distance: 200    Wheelchair 50 feet with 2 turns activity    Assist        Assist Level: Supervision/Verbal cueing   Wheelchair 150 feet activity     Assist      Assist Level: Supervision/Verbal cueing   Blood pressure 134/67, pulse (!) 107,  temperature 97.6 F (36.4 C), temperature source Oral, resp. rate 18, height 5\' 4"  (1.626 m), weight 60.6 kg, SpO2 96%.  Medical Problem List and Plan: 1. Functional deficits secondary to Traumatic incomplete quadriplegia s/p C5-7 ACDF 10/12/23,  ASIA C/D- has R>L lower ext weakness as primary neurologic deficits             -patient may  shower             -ELOS/Goals: Sup 14-16d   D/c 5/20- d/w wife  at length 5/6 about plans along with SW  Pt not able ot tolerate air mattress- very painful- asked ot come off it  Con't CIR PT and OT  Turn q2 hours and prevalons 2.  Antithrombotics: -DVT/anticoagulation:  Mechanical: Sequential compression devices, below knee Bilateral lower extremities 4/23- will see if Dr Ellery Guthrie will allow us  to start Lovenox - he said yes, so will start. 30mg  daily.             -antiplatelet therapy: N/A 3. Pain Management: tylenol  prn. Voltaren  QID, lyrica  50mg  BID, flexeril  PRN -10/24/23 leg muscle soreness likely from PT/OT this week; will start muscle rub cream 4/28- still c/o muscle soreness in legs- con't pain regimen as needed 4/30- still sore, but don't want Flexeril  since likely caused confusion- con't tylenol  prn- can use muscle rub which is ordered 5/1- denies a lot of pain- mainly sore form spasticity/working out 5/5- pain yesterday- not today-  5/7- will add Tramadol  50 mg q8 hours prn if pain won't go away otherwise-  due to renal function- doing TID prn- hopefully baclofen  helps pain in RLE as well 5/8- pain better since started baclofen - didn't need tramadol   4. Mood/Behavior/Sleep: LCSW to follow up for evaluation and support when appropriate --Delirium precautions--His mental status has cleared but will monitor for worsening with transfer to new unit .pt has not required prn meds for agitation in several days  Monitor sleep wake cycle             -antipsychotic agents: Change haldol  q 6 hrs prn to Seroquel  12.5mg  tid prn  -Restoril  7.5mg  nightly, cymbalta   20mg  daily 4/23-4/25 no delirium so far 4/28- having some delirium this AM? Not severe, but will monitor since has had in recent past 4/29- still confused this AM- but per nursing , was less confused overnight after Restoril  stopped- still pleasantly confused this AM when first woke up- didn't open eyes.  4/30-5/3 Aox3 this AM 5. Neuropsych/cognition: This patient is usually  capable of making decisions on his own behalf. 6. Skin/Wound Care: Routine pressure relief measures. 7. Fluids/Electrolytes/Nutrition:  Monitor I/O. Routine labs. Continue vitamins/supplements. 8. 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 Willye Harvey, per note, leave in place for 7-10d, Consider resuming In-N-Out catheterization for management of his neurogenic bladder at that time. Recommend using a coud catheter for intermittent catheterization (when resumed).  4/28- will leave foley for 7-10 days 10/31/23 foley likely ready to come out tomorrow? Placed 4/25 5/5- will d/c foley in AM after speaking with pt 5/6- pt wants ot do what wife wants- cannot reach wife to discuss foley removal- pt doesn't want to do cathing and sounds like would rather keep foley- but will discuss with him further 5/7- pt and wife have decided they don't want ot remove foley - they don't want ot go back to in/out caths-  since wasn't emptying before- they want a chronic foley- when offered to remove foley just for rehab, to see if he gets return, he is scared will get bleeding again, due to false passage already had and delcined 10.  Diabetes- A1c 8.0-  with peripheral neuropathy by hx only on Jardiance at home, use SSI in the  hospital  -  10/23/23 CBGs variable but likely d/t steroids; cont SSI for now 4/29- CBG's very labile in last 24 hours 99-383- - it concerns me to start Jardiance since of Foley and risk of UTI's- also for starting Tradjenta  since BG was 99 last night- don't want to drop BG too much 4/30- Will add Tradjenta  since BG's still running 150-242- will monitor closely for CBG hypoglycemia-   10/30/23 CBGs looking great!  -10/31/23 CBGs a little higher the last day, but monitor for trend  5/5- Cbgs all less than 200- con't to monitor  5/6- CBGs 130s- 180s-  5/7-5/8 CBG's 128 to 220- had snack yesterday- still snacking in afternoon CBG (last 3)  Recent Labs    11/03/23 1647 11/03/23 2053 11/04/23 0612  GLUCAP 217* 99 130*     11.  Spasticity affecting LEs> UE and R>L , aggressive ROM  with PT, consider oral meds  4/22- starting baclofen  5 mg TID- with his Cr 1.50- has been running 1.68 to 2.30  4/24- will not increase baclofen  due to Renal issues 12.  PAD s/p SFA and Iliac stenting, continue metoprolol  50mg  daily  13. CKD3b? With AKI -4/22- Pt's Cr down to 1.50 from 2.29 2 days ago and 1.68 yesterday- will monitor 2x/week to see if will reduce further or be labile.   4/24- Cr 1.84- and BUN stable at 55- is drinking per pt  4/28- Cr 1.63- doing better 4/29- Cr 1.59 after 12+ hours of IVF's- and BUN very slightly down to 70 from 72-   5/1- Cr down to 1.43 and BUN down to 53  5/5- Cr 1.51 and BUN down to 37- drinking more!  5/8- Cr 1.8- up from 1.51- but BUN 34 down from 37- will push fluids 14. Questionable neurogenic bowel: continue miralax  32g daily, senokot s 2 tabs qAM  5/1- LBM this Am- was accident- might need bowel program 5/2- Placed on suppository nightly with possible dig stim- had multiple BM's during/after bowel program- if continues like this, will need dig stim.   -10/31/23 LBM last night x2 5/5- LBM last night with bowel program 5/6- BM last evening- no bowel note for last few nights  seen 5/7- LBM 2 nights ago- no bowel note- d/w nursing coordinator 5/8- extra large BM with bowel program last night- went well  15. HLD: crestor  20mg  nightly 16. Delirium/Confusion 4/29- yesterday given IVF's for this- checked U/A and Cx- which showed mod leuks, however rate bacteria and very little WBC's 6-10- which is not a UTI in most cases- will d/w pharmacy-  -pt still pleasantly confused this AM- but didn't even open eyes, was so sleepy, so hard to tease out what is due to sleepiness and what is actual delirium- per nurse, much better last night-  4/30- pt Ox3 this AM- very with it- likely flexeril  based on chart review- given for pain since on no pain meds- will keep off Flexeril  and monitor Sx's  5/5- Sx's resolved 17. Hx of HTN with severe hypotension 4/30- pt's BP dropped to 79 systolic yesterday when sitting with OT- not clear if had TEDS/ACE wraps- will reduce Metoprolol  XL to 12.5 mg daily- from 50 mg daily and see if that helps- don't want ot add midodrine  quite yet.  5/1- Adding midodrine  5 mg TID and adding abd binder- couldn't tolerate ACE wraps at all- just TED's-   5/2- BP yesterday was less symptomatic- but still dropped some- con't regimen -5/3-4/25 BPs a bit better recently; monitor 5/6-5/8 BP doing better-  con't regimen Vitals:   11/01/23 0440 11/01/23 0837 11/01/23 1545 11/01/23 2009  BP: (!) 122/51 (!) 137/58 (!) 133/55 (!) 144/51   11/02/23 0525 11/02/23 0843 11/02/23 1250 11/02/23 2013  BP: (!) 122/43 (!) 128/52 (!) 121/52 (!) 117/53   11/03/23 0505 11/03/23 1438 11/03/23 2007 11/04/23 0456  BP: 125/61 (!) 121/54 (!) 117/59 134/67     18. Atelectasis 4/30- sounds coarse- asked pt to use ICS regularly- is at bedside- also described "spitting things up"_ had "spit cup"- at bedside-  had barely anything in it. 5/5- not spitting up anymore  19. R heel DTI and Stage I on sacrum 5/2- is new- ordered prevalon boots as well as low air loss mattress and needs to be  turned q2 hours- placed order  5/8- pt not able to tolerate air mattress- turn q2 hours and prevalons for Heel DTI- d/w nursing- just removed this AM 20. Spasticity  5/5- will d/w wife tomorrow about restarting Baclofen  vs Dantrolene.   5/6- attempted to call wife again x2- cannot get her 5/7- was able to see wife yesterday and discussed- we will retry Pt on Baclofen  5 mg TID for now- not a higher dose due to renal issues-  and monitor for confusion/delirium- if gets confused, will try Dantrolene.   5/8- no confusion and RLE much less pain- so think it's working 21. Severe malnutrition  5/5- per Dietitian- will do supplements 22. Mild tachycardia  5/8- per pt, has ALWAYS had tachycardia- has been worked up- and nothing found- usually 90s-100's per pt- which is the same here- will monitor  I spent a total of 36   minutes on total care today- >50% coordination of care- due to   D/w nursing x2 about pt care- and his bowel program- also did flyer and given to Nursing coordinator   LOS: 17 days A FACE TO FACE EVALUATION WAS PERFORMED  Zaynah Chawla 11/04/2023, 8:32 AM

## 2023-11-04 NOTE — Progress Notes (Signed)
 Physical Therapy Session Note  Patient Details  Name: Christian Bailey MRN: [id number] Date of Birth: 12-Jun-1941  Today's Date: 11/04/2023 PT Individual Time: 1003-1100 PT Individual Time Calculation (min): 57 min   Short Term Goals: Week 3:  PT Short Term Goal 1 (Week 3): Pt will perform bed mobility with mod A PT Short Term Goal 2 (Week 3): Pt will progress transfers to max A PT Short Term Goal 3 (Week 3): Pt will be able to tolerate x 5 min in standing for WB/postural control retraining/tone management  Skilled Therapeutic Interventions/Progress Updates: Pt presented in bed sleeping but easily aroused and agreeable to therapy. Pt state no pain in BLE at rest. Session focused on bed mobility and transfers. PTA performed stretching to BLE for tone management including heel cord, hamstrings, and adductors. Pt was also able to complete AA heel slides x 5 BLE. PTA noted mild clonus when stretching initiated on LLE but resolved quickly. PTA threaded foley bah and pants total A. Pt was able to flex knee for support and pt performed rolling L/R with maxA. With mod/maxA pt was able to pull self up to long sit x 2 to change shirt with maxA for time management. Pt then completed supine to sit with maxA via log roll technique. Pt denies any symptoms of OH in sitting. Pt set up in [place] and completed Sit to stand in [place] with maxA. After stand pt indicating symptoms with pt compelted Sit to stand to transfer to [facility] with maxA. PWC was tilted back with vitals taken as noted below and pt stating symptoms resolving with chair tilted back. PTA assisted pt aligning PWC in bed due to tight space. PTA cueing pt to turn off PWC. Pt left in chair at end of session with soft touch bell within reach and needs met.   Supine 130/54 (78) HR 116 Sitting EOB 101/48 (65) HR 113 After transfer to [facility] 128/90 (103)     Therapy Documentation Precautions:  Precautions Precautions: Fall, Cervical Precaution/Restrictions Comments:  decreased recall of precautions, not able to verbalize any when prompted Required Braces or Orthoses: Cervical Brace Cervical Brace: Soft collar Restrictions Weight Bearing Restrictions Per Provider Order: No General:   Vital Signs:   Pain: Pain Assessment Pain Scale: 0-10 Pain Score: 0-No pain  Therapy/Group: Individual Therapy  Sair Faulcon 11/04/2023, 12:56 PM

## 2023-11-04 NOTE — Progress Notes (Addendum)
 Patient ID: Christian Bailey, male   DOB: [birth year], 83 y.o.   MRN: [id number]  Wife does plan to come in tomorrow but also wants to pursue [facility] in case she can not provide the care husband needs. She is realistic regarding his care needs. Completed FL2 and will sent to [facility]

## 2023-11-04 NOTE — Progress Notes (Signed)
 Occupational Therapy Session Note  Patient Details  Name: Christian Bailey MRN: [id number] Date of Birth: 1941/03/15  Today's Date: 11/04/2023 OT Individual Time: 1305-1330 OT Individual Time Calculation (min): 25 min    Short Term Goals: Week 2:  OT Short Term Goal 1 (Week 2): Pt will complete static sitting EOB at Mod A in preparation for ADLs OT Short Term Goal 1 - Progress (Week 2): Met OT Short Term Goal 2 (Week 2): Pt will complete UB dressing with Max A OT Short Term Goal 2 - Progress (Week 2): Met OT Short Term Goal 3 (Week 2): Pt will complete self feeding with set up OT Short Term Goal 3 - Progress (Week 2): Met  Skilled Therapeutic Interventions/Progress Updates:    Pt resting in PWC upon arrival. OT intervention with focus on core strengthening and repositioning in PWC. See pain below. Pt requires mod a for trunk flexion to assist with repositioning. Pt pushed through BUE to assist with pushing buttocks off PWC cushion. Dependent for lateral scooting to reposition in w/c. BUE AAROM/PROM for joint moblity. Pt remaned in PWC with all needs withi reach.   Therapy Documentation Precautions:  Precautions Precautions: Fall, Cervical Precaution/Restrictions Comments: decreased recall of precautions, not able to verbalize any when prompted Required Braces or Orthoses: Cervical Brace Cervical Brace: Soft collar Restrictions Weight Bearing Restrictions Per Provider Order: No  Pain: Pain Assessment Pain Scale: 0-10 Pain Score: 4  Pain Location: Groin Pt repositioned with relief reported   Therapy/Group: Individual Therapy  [name] 11/04/2023, 3:00 PM

## 2023-11-04 NOTE — Progress Notes (Shared)
 IP Rehab Bowel Program Documentation   Bowel Program Start time 1840  Dig Stim Indicated? Yes  Dig Stim Prior to Suppository or mini Enema X 2   Output from dig stim: Minimal  Ordered intervention: Suppository Yes , mini enema No ,   Repeat dig stim after Suppository or Mini enema  X 2,  Output? {Desc; minimal/small/moderate/large/very large:110034}  Bowel Program Complete? {YES/NO:21197}, handoff given YES   Patient Tolerated? Yes

## 2023-11-05 DIAGNOSIS — G959 Disease of spinal cord, unspecified: Secondary | ICD-10-CM | POA: Diagnosis not present

## 2023-11-05 LAB — GLUCOSE, CAPILLARY
Glucose-Capillary: 125 mg/dL — ABNORMAL HIGH (ref 70–99)
Glucose-Capillary: 183 mg/dL — ABNORMAL HIGH (ref 70–99)
Glucose-Capillary: 267 mg/dL — ABNORMAL HIGH (ref 70–99)
Glucose-Capillary: 115 mg/dL — ABNORMAL HIGH (ref 70–99)

## 2023-11-05 MED ORDER — DANTROLENE SODIUM 25 MG PO CAPS
50.0000 mg | ORAL_CAPSULE | Freq: Every day | ORAL | Status: DC
  Administered 2023-11-05 – 2023-11-09 (×5): 50 mg via ORAL
  Filled 2023-11-05 (×5): qty 2

## 2023-11-05 MED ORDER — VITAMIN C 500 MG PO TABS
500.0000 mg | ORAL_TABLET | Freq: Every day | ORAL | Status: DC
  Administered 2023-11-05 – 2023-11-08 (×4): 500 mg via ORAL
  Filled 2023-11-05 (×4): qty 1

## 2023-11-05 MED ORDER — ZINC SULFATE 220 (50 ZN) MG PO CAPS
220.0000 mg | ORAL_CAPSULE | Freq: Every day | ORAL | Status: DC
  Administered 2023-11-05 – 2023-11-08 (×4): 220 mg via ORAL
  Filled 2023-11-05 (×4): qty 1

## 2023-11-05 NOTE — Progress Notes (Signed)
 Initial Nutrition Assessment  DOCUMENTATION CODES:   Severe malnutrition in context of chronic illness  INTERVENTION:   Continue to offer Ensure Enlive po BID, each supplement provides 350 kcal and 20 grams of protein. Continue Prosource Plus 30 ml PO once daily, each packet provides 100 kcal and 15 gm protein. Continue MVI with minerals daily. Vitamin C 500 mg once daily x 30 days to support wound healing. Zinc sulfate 220 mg daily x 14 days to support wound healing.  NUTRITION DIAGNOSIS:   Severe Malnutrition related to chronic illness (cervical myelopathy) as evidenced by severe muscle depletion, percent weight loss (15% weight loss x 6 months).  Ongoing   GOAL:   Patient will meet greater than or equal to 90% of their needs  Progressing   MONITOR:   PO intake, Supplement acceptance, Skin  REASON FOR ASSESSMENT:   Consult Wound healing  ASSESSMENT:   83 yo male admitted with acute incomplete quadriplegia with R>L LE weakness d/t cervical myelopathy S/P C5-7 ACDF on 4/15. PMH includes osteoarthritis, HLD, anemia, HTN, DM-2, severe PAD, macular degeneration/retinal detachment, polyneuropathy, recurrent falls, GERD.  Patient continues on a regular diet. He ate 50% of breakfast today. Average meal intakes 60%. He is being offered Ensure Enlive supplements BID, has not been drinking much of them lately. He is taking the protein supplement daily.   Per WOC RN note today, sacral wound is stage 2. He also has a DTPI to R heel.   Labs reviewed.  CBG: [phone]  Medications reviewed and include colace, ferrous sulfate , novolog , tradjenta , MVI with minerals, protonix , lyrica , senokot-s, flomax .  Admit weight 60.6 kg, no new weight available.  Diet Order:   Diet Order             Diet regular Room service appropriate? Yes with Assist; Fluid consistency: Thin  Diet effective now                   EDUCATION NEEDS:   Education needs have been  addressed  Skin:  Skin Assessment: Skin Integrity Issues: Skin Integrity Issues:: DTI, Stage II DTI: R heel Stage II: sacrum  Last BM:  5/8 type 6  Height:   Ht Readings from Last 1 Encounters:  10/18/23 5\' 4"  (1.626 m)    Weight:   Wt Readings from Last 1 Encounters:  10/18/23 60.6 kg    Ideal Body Weight:  59.1 kg  BMI:  Body mass index is 22.93 kg/m.  Estimated Nutritional Needs:   Kcal:  1700-1900  Protein:  85-95 gm  Fluid:  1.7-1.9 L   Barnet Boots RD, LDN, CNSC Contact via secure chat. If unavailable, use group chat "RD Inpatient."

## 2023-11-05 NOTE — Group Note (Unsigned)
 Patient Details Name: Christian Bailey MRN: [id number] DOB: March 11, 1941 Today's Date: 11/05/2023  Time Calculation:        Group Description: [employer]iner[employer] Club: Patient participated in [employer] with both OT and SLP with focus on self-feeding, dysphagia goals, cognitive goals, and appropriate social interaction within a group environment.    Individual level documentation: Patient participated in diner's club and required {assistance:28279} assist for functional use of {RIGHT/LEFT:20294} UE during self-feeding.   Patient participated in diner's club with focus on dysphagia goals. Patient consumed their lunch meal of {diet with liquids:28280}. Patient consumed meal {consistent/inconsistent:28281} overt s/s of aspiration and required {assistance:28279} assist for use of swallowing compensatory strategies. Recommend patient continue current diet.   Patient participated in diner's club with focus on cognitive goals. Patient required (drop down selection {assistance:28279} assist for selective attention in a mildly distracting environment. Patient also required {assistance:28279} assist for visual scanning to {RIGHT/LEFT:20294} field of environment. Patient also demonstrated appropriate social interaction within a group environment with overall {assistance:28279} assist.   Pain: Pain Assessment Pain Scale: 0-10 Pain Score: 0-No pain  Precautions:     Christian Bailey [facility] 11/05/2023, 3:52 PM

## 2023-11-05 NOTE — Progress Notes (Signed)
 Patient ID: Christian Bailey, male   DOB: 03/28/1941, 83 y.o.   MRN: [id number] Wife was here to see pt in therapies. She feels she can not provide the assist he will require by herself. Will talk with son's this weekend. Aware of options available.

## 2023-11-05 NOTE — Progress Notes (Signed)
 Occupational Therapy Session Note  Patient Details  Name: Christian Bailey MRN: [id number] Date of Birth: 18-Jul-1940  Today's Date: 11/05/2023 OT Individual Time: 1000-1100 OT Individual Time Calculation (min): 60 min    Short Term Goals: Week 2:  OT Short Term Goal 1 (Week 2): Pt will complete static sitting EOB at Mod A in preparation for ADLs OT Short Term Goal 1 - Progress (Week 2): Met OT Short Term Goal 2 (Week 2): Pt will complete UB dressing with Max A OT Short Term Goal 2 - Progress (Week 2): Met OT Short Term Goal 3 (Week 2): Pt will complete self feeding with set up OT Short Term Goal 3 - Progress (Week 2): Met  Skilled Therapeutic Interventions/Progress Updates:    Pt's wife present for educaiton. Pt's wife reported that pt will need to go to SNF for further rehab before returning home. Pt's wife observed therapy but did not participate. OT intervention with focus on LB dressing at bed level, bed mobility, sitting balance, TB transfer, and activity toleraance to increase independence with BADLs. Ted hose donned at bed level. Pt dependent for LB dressing with max A for tolling R/L in bed using bed rails. Supine>sit EOB with max A. TB transfer with tot A to PWC. Informed pt and wife that he will not be discharging from CIR with a w/c and the SNF will provide w/c for use. Pt remained in PWC with wife present.   Therapy Documentation Precautions:  Precautions Precautions: Fall, Cervical Precaution/Restrictions Comments: decreased recall of precautions, not able to verbalize any when prompted Required Braces or Orthoses: Cervical Brace Cervical Brace: Soft collar Restrictions Weight Bearing Restrictions Per Provider Order: No   Pain:  Pt c/o BLE pain with incrased tone during transitional movements; repositioned with relief reported   Therapy/Group: Individual Therapy  [name] 11/05/2023, 12:13 PM

## 2023-11-05 NOTE — Consult Note (Signed)
 WOC Nurse wound follow up Wound type: DTPI R heel, stage 2 sacrum  Measurement: see nursing flowsheet for heel and sacrum  Wound bed: purple maroon discoloration skin intact posterior heel, sacral wound: partial thickness skin loss.   Drainage Heel; no drainage, see nursing flowsheet for sacrum Periwound: Heel : erythema Sacrum: appears patient has some moisture associated skin damage surrounding the wound Dressing procedure/placement/frequency: Cleanse R heel with soap and water, dry and apply Xeroform gauze to wound bed daily, cover with dry gauze and silicone foam or Kerlix roll gauze whichever is preferred. Place R foot in Prevalon boot when in bed to offload pressure Timm Foot [phone]   Cleanse with saline, pat dry.  Apply silicone foam dressing, change every 3 days and PRN soling.   ASSESS UNDER dressings each shift for any acute changes in the wounds.  Gillermo Lack, RN, MSN, [facility] WOC Team

## 2023-11-05 NOTE — Progress Notes (Signed)
 Physical Therapy Session Note  Patient Details  Name: Christian Bailey MRN: [id number] Date of Birth: 08-Apr-1941  Today's Date: 11/05/2023 PT Individual Time: 1105-1205 PT Individual Time Calculation (min): 60 min   Short Term Goals: Week 3:  PT Short Term Goal 1 (Week 3): Pt will perform bed mobility with mod A PT Short Term Goal 2 (Week 3): Pt will progress transfers to max A PT Short Term Goal 3 (Week 3): Pt will be able to tolerate x 5 min in standing for WB/postural control retraining/tone management  Skilled Therapeutic Interventions/Progress Updates: Pt presented in PWC agreeable to therapy. Pt states pain well controlled at moment. Session initially to focus on family ed however per discussion with OT family has made decision to pursue SNF. PTA provided education to family regarding continuing to provide ROM stretching to patient while at SNF for tone management. PTA advised will provide HEP for stretching/ROM as well. Pt navigated PWC to day room and pt was able to complete lateral lean to facilitate Slide board transfer to mat. Pt then performed Slide board transfer with pt noted to have improved anterior lean as well as ability to push a little through BLE. Pt then worked on anterior leans including reaching BLE to bench  10. Pt also worked on trunk rotation holding 1Kg weighted ball performing trunk rotations. PTA providing gentle facilitation for improved trunk rotation. Pt also performed knee flexion/extension x 10 bilaterally with towel under foot to allow for ease of moment. Pt then indicated nausea and then episode of emesis. Vitals stable. Pt set up with Slide board and completed Slide board transfer back to [facility] with maxA. Pt navigated PWC back to room with supervision and required minA for safe navigation in room to align with bed. Pt left in PWC at end of session with call bell within reach and wife present.      Therapy Documentation Precautions:  Precautions Precautions: Fall,  Cervical Precaution/Restrictions Comments: decreased recall of precautions, not able to verbalize any when prompted Required Braces or Orthoses: Cervical Brace Cervical Brace: Soft collar Restrictions Weight Bearing Restrictions Per Provider Order: No General:   Vital Signs: Therapy Vitals Temp: 97.8 F (36.6 C) Temp Source: Oral Pulse Rate: 89 Resp: 18 BP: (!) 126/52 Patient Position (if appropriate): Sitting Oxygen Therapy SpO2: 98 % O2 Device: Room Air Pain: Pain Assessment Pain Scale: 0-10 Pain Score: 0-No pain   Therapy/Group: Individual Therapy  Vrinda Heckstall 11/05/2023, 4:34 PM

## 2023-11-05 NOTE — Progress Notes (Shared)
 IP Rehab Bowel Program Documentation   Bowel Program Start time 1820  Dig Stim Indicated? Yes  Dig Stim Prior to Suppository or mini Enema X 2   Output from dig stim: None  Ordered intervention: Suppository Yes , mini enema No ,   Repeat dig stim after Suppository or Mini enema  X 2,  Output? {Desc; minimal/small/moderate/large/very large:110034}   Bowel Program Complete? {YES/NO:21197}, handoff given ***  Patient Tolerated? {YES/NO:21197}

## 2023-11-05 NOTE — Progress Notes (Signed)
 Physical Therapy Session Note  Patient Details  Name: Christian Bailey MRN: [id number] Date of Birth: [birth year]  Today's Date: 11/05/2023 PT Individual Time: 3086-5784 PT Individual Time Calculation (min): 30 min   Short Term Goals: Week 3:  PT Short Term Goal 1 (Week 3): Pt will perform bed mobility with mod A PT Short Term Goal 2 (Week 3): Pt will progress transfers to max A PT Short Term Goal 3 (Week 3): Pt will be able to tolerate x 5 min in standing for WB/postural control retraining/tone management  Skilled Therapeutic Interventions/Progress Updates: Today's session focused on management of spasticity via WB, standing tolerance, and functional reaching. Skilled assistance was given via activity modification for task completion, Max A STS transfer using STEDY,  and verbal/tactile cuing for appropriate technique/posturing throughout today's activities. Pt reported a bout of nausea during initial STS transfer that relieved with rest in a supported standing position on stedy. Pt had no abnormal response to remainder of treatment involving reaching to varying sized objects, Pt was able to control his forward trunk lean using horizontal bar on stedy with supervision. Pt also responded well to dual task object/color recognition in coordination with functional reaching.    Therapy Documentation Precautions:  Precautions Precautions: Fall, Cervical Precaution/Restrictions Comments: decreased recall of precautions, not able to verbalize any when prompted Required Braces or Orthoses: Cervical Brace Cervical Brace: Soft collar Restrictions Weight Bearing Restrictions Per Provider Order: No   Pain: Pt reported pain in L elbow with pressure on arm rests, padded sleeve was given, pt stated this remedied the pain, no numerical report was given.  Therapy/Group: Individual Therapy  Bunny Caroli 11/05/2023, 1:55 PM  Ouida Bloom, PT, DPT, CBIS 11/05/23

## 2023-11-05 NOTE — Plan of Care (Signed)
  Problem: Consults Goal: RH GENERAL PATIENT EDUCATION Description: See Patient Education module for education specifics. Outcome: Progressing   Problem: RH BOWEL ELIMINATION Goal: RH STG MANAGE BOWEL WITH ASSISTANCE Description: STG Manage Bowel with mod I  Assistance. Outcome: Progressing Goal: RH STG MANAGE BOWEL W/MEDICATION W/ASSISTANCE Description: STG Manage Bowel with Medication with mod I Assistance. Outcome: Progressing   Problem: RH BLADDER ELIMINATION Goal: RH STG MANAGE BLADDER WITH ASSISTANCE Description: STG Manage Bladder With toileting / mod I Assistance Outcome: Progressing Goal: RH STG MANAGE BLADDER WITH MEDICATION WITH ASSISTANCE Description: STG Manage Bladder With Medication With mod I Assistance. Outcome: Progressing   Problem: RH SAFETY Goal: RH STG ADHERE TO SAFETY PRECAUTIONS W/ASSISTANCE/DEVICE Description: STG Adhere to Safety Precautions With cues Assistance/Device. Outcome: Progressing   Problem: RH PAIN MANAGEMENT Goal: RH STG PAIN MANAGED AT OR BELOW PT'S PAIN GOAL Description: < 4 with prns Outcome: Progressing   Problem: RH KNOWLEDGE DEFICIT GENERAL Goal: RH STG INCREASE KNOWLEDGE OF SELF CARE AFTER HOSPITALIZATION Description: Patient and wife will be able to manage care at discharge using educational resources for medication, skin care,dietary modification and fall prevention independently Outcome: Progressing   Problem: Education: Goal: Ability to describe self-care measures that may prevent or decrease complications (Diabetes Survival Skills Education) will improve Outcome: Progressing Goal: Individualized Educational Video(s) Outcome: Progressing   Problem: Coping: Goal: Ability to adjust to condition or change in health will improve Outcome: Progressing   Problem: Fluid Volume: Goal: Ability to maintain a balanced intake and output will improve Outcome: Progressing   Problem: Health Behavior/Discharge Planning: Goal: Ability  to identify and utilize available resources and services will improve Outcome: Progressing Goal: Ability to manage health-related needs will improve Outcome: Progressing   Problem: Metabolic: Goal: Ability to maintain appropriate glucose levels will improve Outcome: Progressing   Problem: Nutritional: Goal: Maintenance of adequate nutrition will improve Outcome: Progressing Goal: Progress toward achieving an optimal weight will improve Outcome: Progressing   Problem: Skin Integrity: Goal: Risk for impaired skin integrity will decrease Outcome: Progressing   Problem: Tissue Perfusion: Goal: Adequacy of tissue perfusion will improve Outcome: Progressing

## 2023-11-05 NOTE — Group Note (Signed)
 Patient Details Name: Christian Bailey MRN: [id number] DOB: 1940-09-30 Today's Date: 11/05/2023  Pt missed 45 min of diner's club due to fatigue and has just thrown up in previous session and not wanting food at this time.     Henrene Locust [facility] 11/05/2023, 5:04 PM

## 2023-11-05 NOTE — Progress Notes (Signed)
 PROGRESS NOTE   Subjective/Complaints:  Pt reports feels like ina different place, in spite of knowing where is- and feels like should be cold, winter, even though know's it's spring.  Had good BM's overnight with bowel program.  Less tightness in RLE but still bothersome.    ROS:   Pt denies SOB, abd pain, CP, N/V/C/D, and vision changes   Objective:   No results found.   No results for input(s): "WBC", "HGB", "HCT", "PLT" in the last 72 hours.    Recent Labs    11/04/23 0611  NA 138  K 4.0  CL 102  CO2 28  GLUCOSE 136*  BUN 34*  CREATININE 1.80*  CALCIUM  8.7*      Intake/Output Summary (Last 24 hours) at 11/05/2023 0836 Last data filed at 11/05/2023 0800 Gross per 24 hour  Intake 358 ml  Output 2150 ml  Net -1792 ml     Pressure Injury 10/28/23 Heel Right Deep Tissue Pressure Injury - Purple or maroon localized area of discolored intact skin or blood-filled blister due to damage of underlying soft tissue from pressure and/or shear. DTI on bottom of heel (Active)  10/28/23 1820  Location: Heel  Location Orientation: Right  Staging: Deep Tissue Pressure Injury - Purple or maroon localized area of discolored intact skin or blood-filled blister due to damage of underlying soft tissue from pressure and/or shear.  Wound Description (Comments): DTI on bottom of heel  Present on Admission:     Physical Exam: Vital Signs Blood pressure (!) 153/64, pulse 99, temperature 98 F (36.7 C), resp. rate 19, height 5\' 4"  (1.626 m), weight 60.6 kg, SpO2 96%.        General: awake, alert, appropriate, sitting up in bed; just finished breakfast tray; NAD HENT: conjugate gaze; oropharynx moist CV: regular rat and rhythm; no JVD Pulmonary: CTA B/L; no W/R/R- good air movement GI: soft, NT, ND, (+)BS- normoactive Psychiatric: appropriate Neurological: Ox3  MAS of 1+ - so doing better (was 2-3 prior to baclofen )  in RLE- better this AM  Skin: Heels covered with foam dressings B/L- R heel DTI and Sacral Stage I GU- foley in place, clear yellow urine  PRIOR EXAMS: Neurological: Ox3- MAS of 2-3 in LE's MSK: Ue's 4/5 B/L throughout LE's- 2-/5 B/L-in HF/KE- 4-/5 DF/PF but RLE weaker than LLE   Spasticity- MAS of 1+ to 2 in Ue's this Am and 3 in RLE- MAS worse this AM- 2-3 throughout Neurologic: Cranial nerves II through XII intact, motor strength is 4/5 in bilateral  bicep, tricep, grip,and finger ext and hand intrinsics, 3- RIght delt and 4/5 left delt, 3- bilateral  hip flexor, 3- RIght and 4- left knee extensors, 4/5 bilateral ankle dorsiflexor and plantar flexor Sensory exam normal sensation to light touch and proprioception in bilateral upper and lower extremities Tone- 3+ a bilateral biceps triceps and BR, + Hoffman's bilateral 2/5 RIght 3+ left knee Also MAS 3 tone in RIght Knee flexors and extensors, MAS 1 at the left knee Clonus at bilateral ankles  Musculoskeletal: Full range of motion in all 4 extremities. No joint swelling      Assessment/Plan: 1. Functional deficits which require  3+ hours per day of interdisciplinary therapy in a comprehensive inpatient rehab setting. Physiatrist is providing close team supervision and 24 hour management of active medical problems listed below. Physiatrist and rehab team continue to assess barriers to discharge/monitor patient progress toward functional and medical goals  Care Tool:  Bathing              Bathing assist Assist Level: Total Assistance - Patient < 25%     Upper Body Dressing/Undressing Upper body dressing   What is the patient wearing?: Pull over shirt    Upper body assist Assist Level: Moderate Assistance - Patient 50 - 74%    Lower Body Dressing/Undressing Lower body dressing      What is the patient wearing?: Underwear/pull up, Pants     Lower body assist Assist for lower body dressing: Dependent - Patient 0%      Toileting Toileting    Toileting assist Assist for toileting: Dependent - Patient 0%     Transfers Chair/bed transfer  Transfers assist     Chair/bed transfer assist level: Maximal Assistance - Patient 25 - 49%     Locomotion Ambulation   Ambulation assist   Ambulation activity did not occur: Safety/medical concerns  Assist level: 2 helpers Assistive device: [name] Max distance: 10'   Walk 10 feet activity   Assist  Walk 10 feet activity did not occur: Safety/medical concerns  Assist level: 2 helpers Assistive device: Walker-Eva   Walk 50 feet activity   Assist Walk 50 feet with 2 turns activity did not occur: Safety/medical concerns         Walk 150 feet activity   Assist Walk 150 feet activity did not occur: Safety/medical concerns         Walk 10 feet on uneven surface  activity   Assist Walk 10 feet on uneven surfaces activity did not occur: Safety/medical concerns         Wheelchair     Assist Is the patient using a wheelchair?: Yes Type of Wheelchair: Power    Wheelchair assist level: Supervision/Verbal cueing Max wheelchair distance: 200    Wheelchair 50 feet with 2 turns activity    Assist        Assist Level: Supervision/Verbal cueing   Wheelchair 150 feet activity     Assist      Assist Level: Supervision/Verbal cueing   Blood pressure (!) 153/64, pulse 99, temperature 98 F (36.7 C), resp. rate 19, height 5\' 4"  (1.626 m), weight 60.6 kg, SpO2 96%.  Medical Problem List and Plan: 1. 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  Antithrombotics: -DVT/anticoagulation:  Mechanical: Sequential compression devices, below knee Bilateral lower extremities 4/23- will see if Dr Ellery Guthrie will allow us  to start Lovenox - he said yes, so will start. 30mg  daily.             -antiplatelet therapy: N/A 3. Pain Management: tylenol  prn. Voltaren  QID, lyrica  50mg  BID, flexeril  PRN -  10/24/23 leg muscle soreness likely from PT/OT this week; will start muscle rub cream 4/28- still c/o muscle soreness in legs- con't pain regimen as needed 4/30- still sore, but don't want Flexeril  since likely caused confusion- con't tylenol  prn- can use muscle rub which is ordered 5/1- denies a lot of pain- mainly sore form spasticity/working out 5/5- pain yesterday- not today-  5/7- will add Tramadol  50 mg q8 hours prn if pain won't go away otherwise-  due to renal function- doing TID prn- hopefully baclofen  helps pain in RLE as well 5/8- pain better since started baclofen - didn't need tramadol  5/9- will add Dantrolene 50 mg at bedtime and see if tolerates- if so, will con't to increase the dose to help spasticity and reduce chance of sedation/side effects  4. Mood/Behavior/Sleep: LCSW to follow up for evaluation and support when appropriate --Delirium precautions--His mental status has cleared but will monitor for worsening with transfer to new unit .pt has not required prn meds for agitation in several days  Monitor sleep wake cycle             -antipsychotic agents: Change haldol  q 6 hrs prn to Seroquel  12.5mg  tid prn  -Restoril  7.5mg  nightly, cymbalta  20mg  daily 4/23-4/25 no delirium so far 4/28- having some delirium this AM? Not severe, but will monitor since has had in recent past 4/29- still confused this AM- but per nursing , was less confused overnight after Restoril  stopped- still pleasantly confused this AM when first woke up- didn't open eyes.  5/9 Aox3 this AM 5. Neuropsych/cognition: This patient is  usually  capable of making decisions on his own behalf. 6. Skin/Wound Care: Routine pressure relief measures. 7. Fluids/Electrolytes/Nutrition:  Monitor I/O. Routine labs. Continue vitamins/supplements. 8. 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 Willye Harvey, per note, leave in place for 7-10d, Consider resuming In-N-Out catheterization for management of his neurogenic bladder at that time. Recommend using a coud catheter for intermittent catheterization (when resumed).  4/28- will leave foley for 7-10 days 10/31/23 foley likely ready to come out tomorrow? Placed 4/25 5/5- will d/c foley in AM after speaking with pt 5/6- pt wants ot do what wife wants- cannot reach wife to discuss foley removal- pt doesn't want to do cathing and sounds like would rather keep foley- but will discuss with him further 5/7- pt and wife have decided they don't want ot remove foley - they don't want ot go back to in/out caths-  since wasn't emptying before- they want a chronic foley- when offered to remove foley just for rehab, to see if he gets return, he is scared will get bleeding again, due to false passage already had and declined 10.  Diabetes- A1c 8.0-  with peripheral neuropathy by hx only on Jardiance at home, use SSI in the hospital  -10/23/23 CBGs variable but likely d/t steroids; cont SSI for now 4/29- CBG's very labile in last 24 hours 99-383- - it concerns me to start Jardiance since of Foley and risk of UTI's- also for starting Tradjenta  since BG was 99 last night- don't want to drop BG too much 4/30- Will add Tradjenta  since BG's still running 150-242- will monitor closely for CBG  hypoglycemia-  10/30/23 CBGs looking great!  -10/31/23 CBGs a little higher the last day, but monitor for trend  5/5- Cbgs all less than 200- con't to monitor  5/6- CBGs 130s- 180s-  5/7-5/9 CBG's 128 to 220- had snack yesterday- still snacking in afternoon CBG (last 3)  Recent Labs    11/04/23 1636 11/04/23 2042 11/05/23 0618  GLUCAP 148* 235* 125*     11.  Spasticity affecting LEs> UE and R>L , aggressive ROM  with PT, consider oral meds  4/22- starting baclofen  5 mg TID- with his Cr 1.50- has been running 1.68 to 2.30  4/24- will not increase baclofen  due to Renal issues  5/9- will add Dantrolene 50 mg at bedtime to reduce chance of spasticity but also reduce chance of side effects/sedation- - will monitor 12.  PAD s/p SFA and Iliac stenting, continue metoprolol  50mg  daily  13. CKD3b? With AKI -4/22- Pt's Cr down to 1.50 from 2.29 2 days ago and 1.68 yesterday- will monitor 2x/week to see if will reduce further or be labile.   4/24- Cr 1.84- and BUN stable at 55- is drinking per pt  4/28- Cr 1.63- doing better 4/29- Cr 1.59 after 12+ hours of IVF's- and BUN very slightly down to 70 from 72-   5/1- Cr down to 1.43 and BUN down to 53  5/5- Cr 1.51 and BUN down to 37- drinking more!  5/8- Cr 1.8- up from 1.51- but BUN 34 down from 37- will push fluids 14. Questionable neurogenic bowel: continue miralax  32g daily, senokot s 2 tabs qAM  5/1- LBM this Am- was accident- might need bowel program 5/2- Placed on suppository nightly with possible dig stim- had multiple BM's during/after bowel program- if continues like this, will need dig stim.   -10/31/23 LBM last night x2 5/5- LBM last night with bowel program 5/6- BM last evening- no bowel note for last few nights seen 5/7- LBM 2 nights ago- no bowel note- d/w nursing coordinator 5/8- extra large BM with bowel program last night- went well  15. HLD: crestor  20mg  nightly 16. Delirium/Confusion 4/29- yesterday given IVF's for this- checked  U/A and Cx- which showed mod leuks, however rate bacteria and very little WBC's 6-10- which is not a UTI in most cases- will d/w pharmacy-  -pt still pleasantly confused this AM- but didn't even open eyes, was so sleepy, so hard to tease out what is due to sleepiness and what is actual delirium- per nurse, much better last night-  4/30- pt Ox3 this AM- very with it- likely flexeril  based on chart review- given for pain since on no pain meds- will keep off Flexeril  and monitor Sx's  5/5- Sx's resolved 17. Hx of HTN with severe hypotension 4/30- pt's BP dropped to 79 systolic yesterday when sitting with OT- not clear if had TEDS/ACE wraps- will reduce Metoprolol  XL to 12.5 mg daily- from 50 mg daily and see if that helps- don't want ot add midodrine  quite yet.  5/1- Adding midodrine  5 mg TID and adding abd binder- couldn't tolerate ACE wraps at all- just TED's-   5/2- BP yesterday was less symptomatic- but still dropped some- con't regimen -5/3-4/25 BPs a bit better recently; monitor 5/6-5/8 BP doing better- con't regimen Vitals:   11/02/23 0525 11/02/23 0843 11/02/23 1250 11/02/23 2013  BP: (!) 122/43 (!) 128/52 (!) 121/52 (!) 117/53   11/03/23 0505 11/03/23 1438 11/03/23 2007 11/04/23 0456  BP: 125/61 (!) 121/54 (!) 117/59 134/67   11/04/23  1305 11/04/23 1616 11/04/23 2004 11/05/23 0622  BP: 129/72 (!) 106/56 (!) 118/52 (!) 153/64     18. Atelectasis 4/30- sounds coarse- asked pt to use ICS regularly- is at bedside- also described "spitting things up"_ had "spit cup"- at bedside-  had barely anything in it. 5/5- not spitting up anymore  19. R heel DTI and Stage I on sacrum 5/2- is new- ordered prevalon boots as well as low air loss mattress and needs to be turned q2 hours- placed order  5/8- pt not able to tolerate air mattress- turn q2 hours and prevalons for Heel DTI- d/w nursing- just removed this AM 20. Spasticity  5/5- will d/w wife tomorrow about restarting Baclofen  vs Dantrolene.    5/6- attempted to call wife again x2- cannot get her 5/7- was able to see wife yesterday and discussed- we will retry Pt on Baclofen  5 mg TID for now- not a higher dose due to renal issues-  and monitor for confusion/delirium- if gets confused, will try Dantrolene.   5/8- no confusion and RLE much less pain- so think it's working 5/9- will add Dantrolene as above 21. Severe malnutrition  5/5- per Dietitian- will do supplements 22. Mild tachycardia  5/8- per pt, has ALWAYS had tachycardia- has been worked up- and nothing found- usually 90s-100's per pt- which is the same here- will monitor    I spent a total of  35  minutes on total care today- >50% coordination of care- due to  D/w and education of pt about dantrolene- also review of spasticity and  bowels  LOS: 18 days A FACE TO FACE EVALUATION WAS PERFORMED  Anayla Giannetti 11/05/2023, 8:36 AM

## 2023-11-06 DIAGNOSIS — R739 Hyperglycemia, unspecified: Secondary | ICD-10-CM | POA: Diagnosis not present

## 2023-11-06 DIAGNOSIS — N319 Neuromuscular dysfunction of bladder, unspecified: Secondary | ICD-10-CM | POA: Diagnosis not present

## 2023-11-06 DIAGNOSIS — K592 Neurogenic bowel, not elsewhere classified: Secondary | ICD-10-CM | POA: Diagnosis not present

## 2023-11-06 DIAGNOSIS — G8250 Quadriplegia, unspecified: Secondary | ICD-10-CM | POA: Diagnosis not present

## 2023-11-06 LAB — GLUCOSE, CAPILLARY
Glucose-Capillary: 154 mg/dL — ABNORMAL HIGH (ref 70–99)
Glucose-Capillary: 216 mg/dL — ABNORMAL HIGH (ref 70–99)
Glucose-Capillary: 201 mg/dL — ABNORMAL HIGH (ref 70–99)
Glucose-Capillary: 103 mg/dL — ABNORMAL HIGH (ref 70–99)

## 2023-11-06 NOTE — Plan of Care (Signed)
  Problem: Consults Goal: RH GENERAL PATIENT EDUCATION Description: See Patient Education module for education specifics. Outcome: Progressing   Problem: RH BOWEL ELIMINATION Goal: RH STG MANAGE BOWEL WITH ASSISTANCE Description: STG Manage Bowel with mod I  Assistance. Outcome: Progressing Goal: RH STG MANAGE BOWEL W/MEDICATION W/ASSISTANCE Description: STG Manage Bowel with Medication with mod I Assistance. Outcome: Progressing   Problem: RH BLADDER ELIMINATION Goal: RH STG MANAGE BLADDER WITH ASSISTANCE Description: STG Manage Bladder With toileting / mod I Assistance Outcome: Progressing Goal: RH STG MANAGE BLADDER WITH MEDICATION WITH ASSISTANCE Description: STG Manage Bladder With Medication With mod I Assistance. Outcome: Progressing   Problem: RH SAFETY Goal: RH STG ADHERE TO SAFETY PRECAUTIONS W/ASSISTANCE/DEVICE Description: STG Adhere to Safety Precautions With cues Assistance/Device. Outcome: Progressing   Problem: RH PAIN MANAGEMENT Goal: RH STG PAIN MANAGED AT OR BELOW PT'S PAIN GOAL Description: < 4 with prns Outcome: Progressing   Problem: RH KNOWLEDGE DEFICIT GENERAL Goal: RH STG INCREASE KNOWLEDGE OF SELF CARE AFTER HOSPITALIZATION Description: Patient and wife will be able to manage care at discharge using educational resources for medication, skin care,dietary modification and fall prevention independently Outcome: Progressing   Problem: Education: Goal: Ability to describe self-care measures that may prevent or decrease complications (Diabetes Survival Skills Education) will improve Outcome: Progressing Goal: Individualized Educational Video(s) Outcome: Progressing   Problem: Coping: Goal: Ability to adjust to condition or change in health will improve Outcome: Progressing   Problem: Fluid Volume: Goal: Ability to maintain a balanced intake and output will improve Outcome: Progressing   Problem: Health Behavior/Discharge Planning: Goal: Ability  to identify and utilize available resources and services will improve Outcome: Progressing Goal: Ability to manage health-related needs will improve Outcome: Progressing   Problem: Metabolic: Goal: Ability to maintain appropriate glucose levels will improve Outcome: Progressing   Problem: Nutritional: Goal: Maintenance of adequate nutrition will improve Outcome: Progressing Goal: Progress toward achieving an optimal weight will improve Outcome: Progressing   Problem: Skin Integrity: Goal: Risk for impaired skin integrity will decrease Outcome: Progressing   Problem: Tissue Perfusion: Goal: Adequacy of tissue perfusion will improve Outcome: Progressing

## 2023-11-06 NOTE — Progress Notes (Signed)
 PROGRESS NOTE   Subjective/Complaints:  Pt doing well, slept well, denies pain, LBM last night, foley in place and doing fine. No other complaints or concerns.    ROS:   Pt denies SOB, abd pain, CP, N/V/C/D, and vision changes   Objective:   No results found.   No results for input(s): "WBC", "HGB", "HCT", "PLT" in the last 72 hours.    Recent Labs    11/04/23 0611  NA 138  K 4.0  CL 102  CO2 28  GLUCOSE 136*  BUN 34*  CREATININE 1.80*  CALCIUM  8.7*      Intake/Output Summary (Last 24 hours) at 11/06/2023 1017 Last data filed at 11/06/2023 1610 Gross per 24 hour  Intake 480 ml  Output 1500 ml  Net -1020 ml     Pressure Injury 10/28/23 Sacrum Medial Stage 1 -  Intact skin with non-blanchable redness of a localized area usually over a bony prominence. STAGE 1 ON SACRUM (Active)  10/28/23 1333  Location: Sacrum  Location Orientation: Medial  Staging: Stage 1 -  Intact skin with non-blanchable redness of a localized area usually over a bony prominence.  Wound Description (Comments): STAGE 1 ON SACRUM  Present on Admission:      Pressure Injury 10/28/23 Heel Right Deep Tissue Pressure Injury - Purple or maroon localized area of discolored intact skin or blood-filled blister due to damage of underlying soft tissue from pressure and/or shear. DTI on bottom of heel (Active)  10/28/23 1820  Location: Heel  Location Orientation: Right  Staging: Deep Tissue Pressure Injury - Purple or maroon localized area of discolored intact skin or blood-filled blister due to damage of underlying soft tissue from pressure and/or shear.  Wound Description (Comments): DTI on bottom of heel  Present on Admission:     Physical Exam: Vital Signs Blood pressure (!) 109/49, pulse 92, temperature 97.6 F (36.4 C), temperature source Oral, resp. rate 18, height 5\' 4"  (1.626 m), weight 60.6 kg, SpO2 96%.   General: awake, alert,  appropriate, sitting up in bed resting; NAD HENT: conjugate gaze; oropharynx moist CV: regular rat and rhythm; no JVD Pulmonary: CTA B/L; no W/R/R- good air movement GI: soft, NT, ND, (+)BS- normoactive Psychiatric: appropriate GU- foley in place, clear yellow urine  PRIOR EXAMS: Neurological: Ox3  MAS of 1+ - so doing better (was 2-3 prior to baclofen ) in RLE- better this AM  Skin: Heels covered with foam dressings B/L- R heel DTI and Sacral Stage I MSK: Ue's 4/5 B/L throughout LE's- 2-/5 B/L-in HF/KE- 4-/5 DF/PF but RLE weaker than LLE   Spasticity- MAS of 1+ to 2 in Ue's this Am and 3 in RLE- MAS worse this AM- 2-3 throughout Neurologic: Cranial nerves II through XII intact, motor strength is 4/5 in bilateral  bicep, tricep, grip,and finger ext and hand intrinsics, 3- RIght delt and 4/5 left delt, 3- bilateral  hip flexor, 3- RIght and 4- left knee extensors, 4/5 bilateral ankle dorsiflexor and plantar flexor Sensory exam normal sensation to light touch and proprioception in bilateral upper and lower extremities Tone- 3+ a bilateral biceps triceps and BR, + Hoffman's bilateral 2/5 RIght 3+ left knee Also  MAS 3 tone in RIght Knee flexors and extensors, MAS 1 at the left knee Clonus at bilateral ankles  Musculoskeletal: Full range of motion in all 4 extremities. No joint swelling      Assessment/Plan: 1. Functional deficits which require 3+ hours per day of interdisciplinary therapy in a comprehensive inpatient rehab setting. Physiatrist is providing close team supervision and 24 hour management of active medical problems listed below. Physiatrist and rehab team continue to assess barriers to discharge/monitor patient progress toward functional and medical goals  Care Tool:  Bathing              Bathing assist Assist Level: Total Assistance - Patient < 25%     Upper Body Dressing/Undressing Upper body dressing   What is the patient wearing?: Pull over shirt     Upper body assist Assist Level: Moderate Assistance - Patient 50 - 74%    Lower Body Dressing/Undressing Lower body dressing      What is the patient wearing?: Underwear/pull up, Pants     Lower body assist Assist for lower body dressing: Dependent - Patient 0%     Toileting Toileting    Toileting assist Assist for toileting: Dependent - Patient 0%     Transfers Chair/bed transfer  Transfers assist     Chair/bed transfer assist level: Maximal Assistance - Patient 25 - 49%     Locomotion Ambulation   Ambulation assist   Ambulation activity did not occur: Safety/medical concerns  Assist level: 2 helpers Assistive device: [name] Max distance: 10'   Walk 10 feet activity   Assist  Walk 10 feet activity did not occur: Safety/medical concerns  Assist level: 2 helpers Assistive device: Walker-Eva   Walk 50 feet activity   Assist Walk 50 feet with 2 turns activity did not occur: Safety/medical concerns         Walk 150 feet activity   Assist Walk 150 feet activity did not occur: Safety/medical concerns         Walk 10 feet on uneven surface  activity   Assist Walk 10 feet on uneven surfaces activity did not occur: Safety/medical concerns         Wheelchair     Assist Is the patient using a wheelchair?: Yes Type of Wheelchair: Power    Wheelchair assist level: Supervision/Verbal cueing Max wheelchair distance: 200    Wheelchair 50 feet with 2 turns activity    Assist        Assist Level: Supervision/Verbal cueing   Wheelchair 150 feet activity     Assist      Assist Level: Supervision/Verbal cueing   Blood pressure (!) 109/49, pulse 92, temperature 97.6 F (36.4 C), temperature source Oral, resp. rate 18, height 5\' 4"  (1.626 m), weight 60.6 kg, SpO2 96%.  Medical Problem List and Plan: 1. 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  Antithrombotics: -DVT/anticoagulation:  Mechanical: Sequential compression devices, below knee Bilateral lower extremities 4/23-  will see if Dr Ellery Guthrie will allow us  to start Lovenox - he said yes, so will start. 30mg  daily.             -antiplatelet therapy: N/A 3. Pain Management: tylenol  prn. Voltaren  QID, lyrica  50mg  BID, flexeril  PRN -10/24/23 leg muscle soreness likely from PT/OT this week; will start muscle rub cream 4/28- still c/o muscle soreness in legs- con't pain regimen as needed 4/30- still sore, but don't want Flexeril  since likely caused confusion- con't tylenol  prn- can use muscle rub which is ordered 5/1- denies a lot of pain- mainly sore form spasticity/working out 5/5- pain yesterday- not today-  5/7- will add Tramadol  50 mg q8 hours prn if pain won't go away otherwise-  due to renal function- doing TID prn- hopefully baclofen  helps pain in RLE as well 5/8- pain better since started baclofen - didn't need tramadol  5/9- will add Dantrolene 50 mg at bedtime and see if tolerates- if so, will con't to increase the dose to help spasticity and reduce chance of sedation/side effects  4. Mood/Behavior/Sleep: LCSW to follow up for evaluation and support when appropriate --Delirium precautions--His mental status has cleared but will monitor for worsening with transfer to new unit .pt has not required prn meds for agitation in several days  Monitor sleep wake cycle             -antipsychotic agents: Change haldol  q 6 hrs prn to Seroquel  12.5mg  tid prn  -Restoril  7.5mg  nightly, cymbalta  20mg  daily 4/23-4/25 no delirium so far 4/28- having some delirium this  AM? Not severe, but will monitor since has had in recent past 4/29- still confused this AM- but per nursing , was less confused overnight after Restoril  stopped- still pleasantly confused this AM when first woke up- didn't open eyes.  5/9 Aox3 this AM 5. Neuropsych/cognition: This patient is usually  capable of making decisions on his own behalf. 6. Skin/Wound Care: Routine pressure relief measures. 7. Fluids/Electrolytes/Nutrition:  Monitor I/O. Routine labs. Continue vitamins/supplements. 8. 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 Willye Harvey, per note, leave in place for 7-10d, Consider resuming In-N-Out catheterization for management of his neurogenic bladder at that time. Recommend using a coud catheter for intermittent catheterization (when resumed).  4/28- will leave foley for 7-10 days 10/31/23 foley likely ready to come out tomorrow? Placed 4/25 5/5- will d/c foley in AM after speaking with pt 5/6- pt wants ot do what wife wants- cannot reach wife to discuss foley removal- pt doesn't want to do cathing and sounds like would rather keep foley- but will discuss with him further 5/7- pt and wife have decided they don't want to remove foley - they don't want ot go back to in/out caths-  since wasn't emptying before- they want a chronic foley- when offered to remove foley just for rehab, to see if he gets return, he is scared will get bleeding again, due to false passage already had and declined 10.  Diabetes- A1c 8.0-  with peripheral neuropathy by hx only on Jardiance at home, use SSI in the hospital  -10/23/23 CBGs variable but likely d/t steroids; cont SSI for now 4/29- CBG's  very labile in last 24 hours 99-383- -  it concerns me to start Jardiance since of Foley and risk of UTI's- also for starting Tradjenta  since BG was 99 last night- don't want to drop BG too much 4/30- Will add Tradjenta  since BG's still running 150-242- will monitor closely for CBG hypoglycemia-   10/30/23 CBGs looking great!  -10/31/23 CBGs a little higher the last day, but monitor for trend  5/5- Cbgs all less than 200- con't to monitor  5/6- CBGs 130s- 180s- 5/7-5/9 CBG's 128 to 220- had snack yesterday- still snacking in afternoon -11/06/23 CBGs great except for afternoon d/t snacks. Leave meds alone for now CBG (last 3)  Recent Labs    11/05/23 1632 11/05/23 2100 11/06/23 0546  GLUCAP 267* 115* 154*     11.  Spasticity affecting LEs> UE and R>L , aggressive ROM  with PT, consider oral meds  4/22- starting baclofen  5 mg TID- with his Cr 1.50- has been running 1.68 to 2.30  4/24- will not increase baclofen  due to Renal issues 5/9- will add Dantrolene 50 mg at bedtime to reduce chance of spasticity but also reduce chance of side effects/sedation- - will monitor 12.  PAD s/p SFA and Iliac stenting, continue metoprolol  50mg  daily  13. CKD3b? With AKI -4/22- Pt's Cr down to 1.50 from 2.29 2 days ago and 1.68 yesterday- will monitor 2x/week to see if will reduce further or be labile.   4/24- Cr 1.84- and BUN stable at 55- is drinking per pt  4/28- Cr 1.63- doing better 4/29- Cr 1.59 after 12+ hours of IVF's- and BUN very slightly down to 70 from 72-   5/1- Cr down to 1.43 and BUN down to 53  5/5- Cr 1.51 and BUN down to 37- drinking more!  5/8- Cr 1.8- up from 1.51- but BUN 34 down from 37- will push fluids 14. Questionable neurogenic bowel: continue miralax  32g daily, senokot s 2 tabs qAM  5/1- LBM this Am- was accident- might need bowel program 5/2- Placed on suppository nightly with possible dig stim- had multiple BM's during/after bowel program- if continues like this, will need dig stim.    -10/31/23 LBM last night x2 5/5- LBM last night with bowel program 5/6- BM last evening- no bowel note for last few nights seen 5/7- LBM 2 nights ago- no bowel note- d/w nursing coordinator 11/06/23 LBM last night 15. HLD: crestor  20mg  nightly 16. Delirium/Confusion 4/29- yesterday given IVF's for this- checked U/A and Cx- which showed mod leuks, however rate bacteria and very little WBC's 6-10- which is not a UTI in most cases- will d/w pharmacy-  -pt still pleasantly confused this AM- but didn't even open eyes, was so sleepy, so hard to tease out what is due to sleepiness and what is actual delirium- per nurse, much better last night-  4/30- pt Ox3 this AM- very with it- likely flexeril  based on chart review- given for pain since on no pain meds- will keep off Flexeril  and monitor Sx's  5/5- Sx's resolved 17. Hx of HTN with severe hypotension 4/30- pt's BP dropped to 79 systolic yesterday when sitting with OT- not clear if had TEDS/ACE wraps- will reduce Metoprolol  XL to 12.5 mg daily- from 50 mg daily and see if that helps- don't want ot add midodrine  quite yet.  5/1- Adding midodrine  5 mg TID and adding abd binder- couldn't tolerate ACE wraps at all- just TED's-   5/2- BP yesterday was less symptomatic- but still dropped some- con't regimen -5/3-4/25 BPs a bit better  recently; monitor 5/6-5/10 BP doing better- con't regimen Vitals:   11/02/23 1250 11/02/23 2013 11/03/23 0505 11/03/23 1438  BP: (!) 121/52 (!) 117/53 125/61 (!) 121/54   11/03/23 2007 11/04/23 0456 11/04/23 1305 11/04/23 1616  BP: (!) 117/59 134/67 129/72 (!) 106/56   11/04/23 2004 11/05/23 0622 11/05/23 1258 11/06/23 0538  BP: (!) 118/52 (!) 153/64 (!) 126/52 (!) 109/49     18. Atelectasis 4/30- sounds coarse- asked pt to use ICS regularly- is at bedside- also described "spitting things up"_ had "spit cup"- at bedside-  had barely anything in it. 5/5- not spitting up anymore  19. R heel DTI and Stage I on sacrum 5/2-  is new- ordered prevalon boots as well as low air loss mattress and needs to be turned q2 hours- placed order  5/8- pt not able to tolerate air mattress- turn q2 hours and prevalons for Heel DTI- d/w nursing- just removed this AM 20. Spasticity  5/5- will d/w wife tomorrow about restarting Baclofen  vs Dantrolene.   5/6- attempted to call wife again x2- cannot get her 5/7- was able to see wife yesterday and discussed- we will retry Pt on Baclofen  5 mg TID for now- not a higher dose due to renal issues-  and monitor for confusion/delirium- if gets confused, will try Dantrolene.   5/8- no confusion and RLE much less pain- so think it's working 5/9- will add Dantrolene as above 21. Severe malnutrition  5/5- per Dietitian- will do supplements 22. Mild tachycardia 5/8- per pt, has ALWAYS had tachycardia- has been worked up- and nothing found- usually 90s-100's per pt- which is the same here- will monitor    LOS: 19 days A FACE TO [facility] EVALUATION WAS PERFORMED  [address] 11/06/2023, 10:17 AM

## 2023-11-07 DIAGNOSIS — G8250 Quadriplegia, unspecified: Secondary | ICD-10-CM | POA: Diagnosis not present

## 2023-11-07 DIAGNOSIS — N319 Neuromuscular dysfunction of bladder, unspecified: Secondary | ICD-10-CM | POA: Diagnosis not present

## 2023-11-07 DIAGNOSIS — K592 Neurogenic bowel, not elsewhere classified: Secondary | ICD-10-CM | POA: Diagnosis not present

## 2023-11-07 DIAGNOSIS — R739 Hyperglycemia, unspecified: Secondary | ICD-10-CM | POA: Diagnosis not present

## 2023-11-07 LAB — GLUCOSE, CAPILLARY
Glucose-Capillary: 135 mg/dL — ABNORMAL HIGH (ref 70–99)
Glucose-Capillary: 224 mg/dL — ABNORMAL HIGH (ref 70–99)
Glucose-Capillary: 212 mg/dL — ABNORMAL HIGH (ref 70–99)
Glucose-Capillary: 185 mg/dL — ABNORMAL HIGH (ref 70–99)

## 2023-11-07 NOTE — Progress Notes (Signed)
 PROGRESS NOTE   Subjective/Complaints:  Pt doing well but says he was "very delirious" yesterday. Didn't sleep well at all because of bizarre dreams. Pain doing fine, LBM last night, foley in place and doing fine without issues. No other complaints or concerns.    ROS:   Pt denies SOB, abd pain, CP, N/V/C/D, and vision changes   Objective:   No results found.   No results for input(s): "WBC", "HGB", "HCT", "PLT" in the last 72 hours.    No results for input(s): "NA", "K", "CL", "CO2", "GLUCOSE", "BUN", "CREATININE", "CALCIUM " in the last 72 hours.     Intake/Output Summary (Last 24 hours) at 11/07/2023 1008 Last data filed at 11/07/2023 0344 Gross per 24 hour  Intake 354 ml  Output 1650 ml  Net -1296 ml     Pressure Injury 10/28/23 Sacrum Medial Stage 1 -  Intact skin with non-blanchable redness of a localized area usually over a bony prominence. STAGE 1 ON SACRUM (Active)  10/28/23 1333  Location: Sacrum  Location Orientation: Medial  Staging: Stage 1 -  Intact skin with non-blanchable redness of a localized area usually over a bony prominence.  Wound Description (Comments): STAGE 1 ON SACRUM  Present on Admission:      Pressure Injury 10/28/23 Heel Right Deep Tissue Pressure Injury - Purple or maroon localized area of discolored intact skin or blood-filled blister due to damage of underlying soft tissue from pressure and/or shear. DTI on bottom of heel (Active)  10/28/23 1820  Location: Heel  Location Orientation: Right  Staging: Deep Tissue Pressure Injury - Purple or maroon localized area of discolored intact skin or blood-filled blister due to damage of underlying soft tissue from pressure and/or shear.  Wound Description (Comments): DTI on bottom of heel  Present on Admission:     Physical Exam: Vital Signs Blood pressure 126/81, pulse (!) 102, temperature 97.7 F (36.5 C), temperature source Oral,  resp. rate 18, height 5\' 4"  (1.626 m), weight 60.6 kg, SpO2 93%.   General: awake, alert, appropriate, getting changed by nursing staff; NAD HENT: conjugate gaze; oropharynx moist CV: a little tachycardic, regular rhythm; no JVD Pulmonary: CTA B/L; no W/R/R- good air movement GI: soft, NT, ND, (+)BS- normoactive Psychiatric: appropriate GU- foley in place, clear yellow urine  PRIOR EXAMS: Neurological: Ox3  MAS of 1+ - so doing better (was 2-3 prior to baclofen ) in RLE- better this AM  Skin: Heels covered with foam dressings B/L- R heel DTI and Sacral Stage I MSK: Ue's 4/5 B/L throughout LE's- 2-/5 B/L-in HF/KE- 4-/5 DF/PF but RLE weaker than LLE   Spasticity- MAS of 1+ to 2 in Ue's this Am and 3 in RLE- MAS worse this AM- 2-3 throughout Neurologic: Cranial nerves II through XII intact, motor strength is 4/5 in bilateral  bicep, tricep, grip,and finger ext and hand intrinsics, 3- RIght delt and 4/5 left delt, 3- bilateral  hip flexor, 3- RIght and 4- left knee extensors, 4/5 bilateral ankle dorsiflexor and plantar flexor Sensory exam normal sensation to light touch and proprioception in bilateral upper and lower extremities Tone- 3+ a bilateral biceps triceps and BR, + Hoffman's bilateral 2/5 RIght 3+  left knee Also MAS 3 tone in RIght Knee flexors and extensors, MAS 1 at the left knee Clonus at bilateral ankles  Musculoskeletal: Full range of motion in all 4 extremities. No joint swelling      Assessment/Plan: 1. Functional deficits which require 3+ hours per day of interdisciplinary therapy in a comprehensive inpatient rehab setting. Physiatrist is providing close team supervision and 24 hour management of active medical problems listed below. Physiatrist and rehab team continue to assess barriers to discharge/monitor patient progress toward functional and medical goals  Care Tool:  Bathing              Bathing assist Assist Level: Total Assistance - Patient < 25%      Upper Body Dressing/Undressing Upper body dressing   What is the patient wearing?: Pull over shirt    Upper body assist Assist Level: Moderate Assistance - Patient 50 - 74%    Lower Body Dressing/Undressing Lower body dressing      What is the patient wearing?: Underwear/pull up, Pants     Lower body assist Assist for lower body dressing: Dependent - Patient 0%     Toileting Toileting    Toileting assist Assist for toileting: Dependent - Patient 0%     Transfers Chair/bed transfer  Transfers assist     Chair/bed transfer assist level: Maximal Assistance - Patient 25 - 49%     Locomotion Ambulation   Ambulation assist   Ambulation activity did not occur: Safety/medical concerns  Assist level: 2 helpers Assistive device: [name] Max distance: 10'   Walk 10 feet activity   Assist  Walk 10 feet activity did not occur: Safety/medical concerns  Assist level: 2 helpers Assistive device: Walker-Eva   Walk 50 feet activity   Assist Walk 50 feet with 2 turns activity did not occur: Safety/medical concerns         Walk 150 feet activity   Assist Walk 150 feet activity did not occur: Safety/medical concerns         Walk 10 feet on uneven surface  activity   Assist Walk 10 feet on uneven surfaces activity did not occur: Safety/medical concerns         Wheelchair     Assist Is the patient using a wheelchair?: Yes Type of Wheelchair: Power    Wheelchair assist level: Supervision/Verbal cueing Max wheelchair distance: 200    Wheelchair 50 feet with 2 turns activity    Assist        Assist Level: Supervision/Verbal cueing   Wheelchair 150 feet activity     Assist      Assist Level: Supervision/Verbal cueing   Blood pressure 126/81, pulse (!) 102, temperature 97.7 F (36.5 C), temperature source Oral, resp. rate 18, height 5\' 4"  (1.626 m), weight 60.6 kg, SpO2 93%.  Medical Problem List and Plan: 1.  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  Antithrombotics: -DVT/anticoagulation:  Mechanical: Sequential compression devices, below knee Bilateral  lower extremities 4/23- will see if Dr Ellery Guthrie will allow us  to start Lovenox - he said yes, so will start. 30mg  daily.             -antiplatelet therapy: N/A 3. Pain Management: tylenol  prn. Voltaren  QID, lyrica  50mg  BID, flexeril  PRN -10/24/23 leg muscle soreness likely from PT/OT this week; will start muscle rub cream 4/28- still c/o muscle soreness in legs- con't pain regimen as needed 4/30- still sore, but don't want Flexeril  since likely caused confusion- con't tylenol  prn- can use muscle rub which is ordered 5/1- denies a lot of pain- mainly sore form spasticity/working out 5/5- pain yesterday- not today-  5/7- will add Tramadol  50 mg q8 hours prn if pain won't go away otherwise-  due to renal function- doing TID prn- hopefully baclofen  helps pain in RLE as well 5/8- pain better since started baclofen - didn't need tramadol  5/9- will add Dantrolene 50 mg at bedtime and see if tolerates- if so, will con't to increase the dose to help spasticity and reduce chance of sedation/side effects  -11/07/23 very delirious yesterday per pt, will stop baclofen  and leave Dantrolene for now; monitor 4. Mood/Behavior/Sleep: LCSW to follow up for evaluation and support when appropriate --Delirium precautions--His mental status has cleared but will monitor for worsening with transfer to new unit .pt has not required prn meds for agitation in several days  Monitor  sleep wake cycle             -antipsychotic agents: Change haldol  q 6 hrs prn to Seroquel  12.5mg  tid prn  -Restoril  7.5mg  nightly, cymbalta  20mg  daily 4/23-4/25 no delirium so far 4/28- having some delirium this AM? Not severe, but will monitor since has had in recent past 4/29- still confused this AM- but per nursing , was less confused overnight after Restoril  stopped- still pleasantly confused this AM when first woke up- didn't open eyes.  5/9 Aox3 this AM -11/07/23 delirious yesterday, stopped baclofen  as above.  5. Neuropsych/cognition: This patient is usually  capable of making decisions on his own behalf. 6. Skin/Wound Care: Routine pressure relief measures. 7. Fluids/Electrolytes/Nutrition:  Monitor I/O. Routine labs. Continue vitamins/supplements. 8. 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 Willye Harvey, per note, leave in place for 7-10d, Consider resuming In-N-Out catheterization for management of his neurogenic bladder at that time. Recommend using a coud catheter for intermittent catheterization (when resumed).  4/28- will leave foley for 7-10 days 10/31/23 foley likely ready to come out tomorrow? Placed 4/25 5/5- will d/c foley in AM after speaking with pt 5/6- pt wants ot do what wife wants- cannot reach wife to discuss foley removal- pt doesn't want to do cathing and sounds like would rather keep foley- but will discuss with him further 5/7- pt and wife have decided they don't want to remove foley - they don't want ot go back to in/out caths-  since wasn't emptying before- they want a chronic foley- when offered to remove foley just for rehab, to see if he gets  return, he is scared will get bleeding again, due to false passage already had and declined 10.  Diabetes- A1c 8.0-  with peripheral neuropathy by hx only on Jardiance at home, use  SSI in the hospital  -10/23/23 CBGs variable but likely d/t steroids; cont SSI for now 4/29- CBG's very labile in last 24 hours 99-383- - it concerns me to start Jardiance since of Foley and risk of UTI's- also for starting Tradjenta  since BG was 99 last night- don't want to drop BG too much 4/30- Will add Tradjenta  since BG's still running 150-242- will monitor closely for CBG hypoglycemia-   10/30/23 CBGs looking great!  -10/31/23 CBGs a little higher the last day, but monitor for trend  5/5- Cbgs all less than 200- con't to monitor  5/6- CBGs 130s- 180s- 5/7-5/9 CBG's 128 to 220- had snack yesterday- still snacking in afternoon -5/10-11/25 CBGs great except for afternoon d/t snacks. Leave meds alone for now CBG (last 3)  Recent Labs    11/06/23 1632 11/06/23 2109 11/07/23 0545  GLUCAP 201* 103* 135*     11.  Spasticity affecting LEs> UE and R>L , aggressive ROM  with PT, consider oral meds  4/22- starting baclofen  5 mg TID- with his Cr 1.50- has been running 1.68 to 2.30  4/24- will not increase baclofen  due to Renal issues 5/9- will add Dantrolene 50 mg at bedtime to reduce chance of spasticity but also reduce chance of side effects/sedation- - will monitor -11/07/23 stopped baclofen  d/t delirium, cont dantrolene 12.  PAD s/p SFA and Iliac stenting, continue metoprolol  50mg  daily  13. CKD3b? With AKI -4/22- Pt's Cr down to 1.50 from 2.29 2 days ago and 1.68 yesterday- will monitor 2x/week to see if will reduce further or be labile.   4/24- Cr 1.84- and BUN stable at 55- is drinking per pt  4/28- Cr 1.63- doing better 4/29- Cr 1.59 after 12+ hours of IVF's- and BUN very slightly down to 70 from 72-   5/1- Cr down to 1.43 and BUN down to 53  5/5- Cr 1.51 and BUN down to 37- drinking more!  5/8- Cr 1.8- up from  1.51- but BUN 34 down from 37- will push fluids 14. Questionable neurogenic bowel: continue miralax  32g daily, senokot s 2 tabs qAM  5/1- LBM this Am- was accident- might need bowel program 5/2- Placed on suppository nightly with possible dig stim- had multiple BM's during/after bowel program- if continues like this, will need dig stim.   -10/31/23 LBM last night x2 5/5- LBM last night with bowel program 5/6- BM last evening- no bowel note for last few nights seen 5/7- LBM 2 nights ago- no bowel note- d/w nursing coordinator 11/07/23 LBM last night 15. HLD: crestor  20mg  nightly 16. Delirium/Confusion 4/29- yesterday given IVF's for this- checked U/A and Cx- which showed mod leuks, however rate bacteria and very little WBC's 6-10- which is not a UTI in most cases- will d/w pharmacy-  -pt still pleasantly confused this AM- but didn't even open eyes, was so sleepy, so hard to tease out what is due to sleepiness and what is actual delirium- per nurse, much better last night-  4/30- pt Ox3 this AM- very with it- likely flexeril  based on chart review- given for pain since on no pain meds- will keep off Flexeril  and monitor Sx's  5/5- Sx's resolved  -11/07/23 SEE ABOVE 17. Hx of HTN with severe hypotension 4/30- pt's BP dropped to 79 systolic yesterday when sitting with OT- not clear if had TEDS/ACE wraps- will reduce Metoprolol  XL to 12.5 mg daily- from 50 mg daily and see if that helps- don't want ot add midodrine  quite yet.  5/1- Adding midodrine  5 mg TID and adding abd binder- couldn't tolerate ACE wraps at all- just TED's-   5/2- BP yesterday was less symptomatic- but still dropped some- con't regimen -5/3-4/25 BPs a bit better recently; monitor 5/6-5/11 BP doing better- con't regimen Vitals:   11/03/23 1438 11/03/23 2007 11/04/23 0456 11/04/23 1305  BP: (!) 121/54 (!) 117/59 134/67 129/72   11/04/23 1616 11/04/23 2004 11/05/23 0622 11/05/23 1258  BP: (!) 106/56 (!) 118/52 (!) 153/64 (!) 126/52    11/06/23 0538 11/06/23 1327 11/06/23 1953 11/07/23 0344  BP: (!) 109/49 109/60 (!) 178/67 126/81     18. Atelectasis 4/30- sounds coarse- asked pt to use ICS regularly- is at bedside- also described "spitting things up"_ had "spit cup"- at bedside-  had barely anything in it. 5/5- not spitting up anymore  19. R heel DTI and Stage I on sacrum 5/2- is new- ordered prevalon boots as well as low air loss mattress and needs to be turned q2 hours- placed order  5/8- pt not able to tolerate air mattress- turn q2 hours and prevalons for Heel DTI- d/w nursing- just removed this AM 20. Spasticity  5/5- will d/w wife tomorrow about restarting Baclofen  vs Dantrolene.   5/6- attempted to call wife again x2- cannot get her 5/7- was able to see wife yesterday and discussed- we will retry Pt on Baclofen  5 mg TID for now- not a higher dose due to renal issues-  and monitor for confusion/delirium- if gets confused, will try Dantrolene.   5/8- no confusion and RLE much less pain- so think it's working 5/9- will add Dantrolene as above 21. Severe malnutrition  5/5- per Dietitian- will do supplements 22. Mild tachycardia 5/8- per pt, has ALWAYS had tachycardia- has been worked up- and nothing found- usually 90s-100's per pt- which is the same here- will monitor    LOS: 20 days A FACE TO FACE EVALUATION WAS PERFORMED  [address] 11/07/2023, 10:08 AM

## 2023-11-07 NOTE — Plan of Care (Signed)
  Problem: Consults Goal: RH GENERAL PATIENT EDUCATION Description: See Patient Education module for education specifics. Outcome: Progressing   Problem: RH BOWEL ELIMINATION Goal: RH STG MANAGE BOWEL WITH ASSISTANCE Description: STG Manage Bowel with mod I  Assistance. Outcome: Progressing Goal: RH STG MANAGE BOWEL W/MEDICATION W/ASSISTANCE Description: STG Manage Bowel with Medication with mod I Assistance. Outcome: Progressing   Problem: RH BLADDER ELIMINATION Goal: RH STG MANAGE BLADDER WITH ASSISTANCE Description: STG Manage Bladder With toileting / mod I Assistance Outcome: Progressing Goal: RH STG MANAGE BLADDER WITH MEDICATION WITH ASSISTANCE Description: STG Manage Bladder With Medication With mod I Assistance. Outcome: Progressing   Problem: RH SAFETY Goal: RH STG ADHERE TO SAFETY PRECAUTIONS W/ASSISTANCE/DEVICE Description: STG Adhere to Safety Precautions With cues Assistance/Device. Outcome: Progressing   Problem: RH PAIN MANAGEMENT Goal: RH STG PAIN MANAGED AT OR BELOW PT'S PAIN GOAL Description: < 4 with prns Outcome: Progressing   Problem: RH KNOWLEDGE DEFICIT GENERAL Goal: RH STG INCREASE KNOWLEDGE OF SELF CARE AFTER HOSPITALIZATION Description: Patient and wife will be able to manage care at discharge using educational resources for medication, skin care,dietary modification and fall prevention independently Outcome: Progressing   Problem: Education: Goal: Ability to describe self-care measures that may prevent or decrease complications (Diabetes Survival Skills Education) will improve Outcome: Progressing Goal: Individualized Educational Video(s) Outcome: Progressing   Problem: Coping: Goal: Ability to adjust to condition or change in health will improve Outcome: Progressing   Problem: Fluid Volume: Goal: Ability to maintain a balanced intake and output will improve Outcome: Progressing   Problem: Health Behavior/Discharge Planning: Goal: Ability  to identify and utilize available resources and services will improve Outcome: Progressing Goal: Ability to manage health-related needs will improve Outcome: Progressing   Problem: Metabolic: Goal: Ability to maintain appropriate glucose levels will improve Outcome: Progressing   Problem: Nutritional: Goal: Maintenance of adequate nutrition will improve Outcome: Progressing Goal: Progress toward achieving an optimal weight will improve Outcome: Progressing   Problem: Skin Integrity: Goal: Risk for impaired skin integrity will decrease Outcome: Progressing   Problem: Tissue Perfusion: Goal: Adequacy of tissue perfusion will improve Outcome: Progressing

## 2023-11-08 ENCOUNTER — Inpatient Hospital Stay (HOSPITAL_COMMUNITY)

## 2023-11-08 DIAGNOSIS — K592 Neurogenic bowel, not elsewhere classified: Secondary | ICD-10-CM | POA: Diagnosis not present

## 2023-11-08 DIAGNOSIS — R252 Cramp and spasm: Secondary | ICD-10-CM | POA: Diagnosis not present

## 2023-11-08 DIAGNOSIS — N319 Neuromuscular dysfunction of bladder, unspecified: Secondary | ICD-10-CM | POA: Diagnosis not present

## 2023-11-08 DIAGNOSIS — I951 Orthostatic hypotension: Secondary | ICD-10-CM | POA: Diagnosis not present

## 2023-11-08 DIAGNOSIS — G8250 Quadriplegia, unspecified: Secondary | ICD-10-CM | POA: Diagnosis not present

## 2023-11-08 LAB — CBC WITH DIFFERENTIAL/PLATELET
Abs Immature Granulocytes: 0.05 10*3/uL (ref 0.00–0.07)
Basophils Absolute: 0 10*3/uL (ref 0.0–0.1)
Basophils Relative: 0 %
Eosinophils Absolute: 0.2 10*3/uL (ref 0.0–0.5)
Eosinophils Relative: 3 %
HCT: 31.3 % — ABNORMAL LOW (ref 39.0–52.0)
Hemoglobin: 10.1 g/dL — ABNORMAL LOW (ref 13.0–17.0)
Immature Granulocytes: 1 %
Lymphocytes Relative: 14 %
Lymphs Abs: 1.2 10*3/uL (ref 0.7–4.0)
MCH: 29.4 pg (ref 26.0–34.0)
MCHC: 32.3 g/dL (ref 30.0–36.0)
MCV: 91 fL (ref 80.0–100.0)
Monocytes Absolute: 0.7 10*3/uL (ref 0.1–1.0)
Monocytes Relative: 8 %
Neutro Abs: 5.9 10*3/uL (ref 1.7–7.7)
Neutrophils Relative %: 74 %
Platelets: 235 10*3/uL (ref 150–400)
RBC: 3.44 MIL/uL — ABNORMAL LOW (ref 4.22–5.81)
RDW: 14.9 % (ref 11.5–15.5)
WBC: 8.1 10*3/uL (ref 4.0–10.5)
nRBC: 0 % (ref 0.0–0.2)

## 2023-11-08 LAB — GLUCOSE, CAPILLARY
Glucose-Capillary: 149 mg/dL — ABNORMAL HIGH (ref 70–99)
Glucose-Capillary: 174 mg/dL — ABNORMAL HIGH (ref 70–99)
Glucose-Capillary: 223 mg/dL — ABNORMAL HIGH (ref 70–99)
Glucose-Capillary: 106 mg/dL — ABNORMAL HIGH (ref 70–99)

## 2023-11-08 LAB — BASIC METABOLIC PANEL WITH GFR
Anion gap: 8 (ref 5–15)
BUN: 26 mg/dL — ABNORMAL HIGH (ref 8–23)
CO2: 29 mmol/L (ref 22–32)
Calcium: 9.3 mg/dL (ref 8.9–10.3)
Chloride: 102 mmol/L (ref 98–111)
Creatinine, Ser: 1.33 mg/dL — ABNORMAL HIGH (ref 0.61–1.24)
GFR, Estimated: 53 mL/min — ABNORMAL LOW (ref >60)
Glucose, Bld: 146 mg/dL — ABNORMAL HIGH (ref 70–99)
Potassium: 3.8 mmol/L (ref 3.5–5.1)
Sodium: 139 mmol/L (ref 135–145)

## 2023-11-08 LAB — HEPATIC FUNCTION PANEL
ALT: 16 U/L (ref 0–44)
AST: 20 U/L (ref 15–41)
Albumin: 3 g/dL — ABNORMAL LOW (ref 3.5–5.0)
Alkaline Phosphatase: 79 U/L (ref 38–126)
Bilirubin, Direct: <0.1 mg/dL (ref 0.0–0.2)
Total Bilirubin: 0.5 mg/dL (ref 0.0–1.2)
Total Protein: 6.6 g/dL (ref 6.5–8.1)

## 2023-11-08 MED ORDER — FERROUS SULFATE 325 (65 FE) MG PO TABS
325.0000 mg | ORAL_TABLET | Freq: Every day | ORAL | Status: DC
  Administered 2023-11-09 – 2023-11-23 (×15): 325 mg via ORAL
  Filled 2023-11-08 (×15): qty 1

## 2023-11-08 MED ORDER — ZINC SULFATE 220 (50 ZN) MG PO CAPS
220.0000 mg | ORAL_CAPSULE | Freq: Every day | ORAL | Status: AC
Start: 2023-11-09 — End: 2023-11-22
  Administered 2023-11-09 – 2023-11-22 (×14): 220 mg via ORAL
  Filled 2023-11-08 (×14): qty 1

## 2023-11-08 MED ORDER — SODIUM CHLORIDE 0.9% FLUSH: 10.0000 mL | INTRAVENOUS | Status: DC | PRN

## 2023-11-08 MED ORDER — ADULT MULTIVITAMIN W/MINERALS CH: 1.0000 | ORAL_TABLET | Freq: Every day | ORAL | Status: DC

## 2023-11-08 MED ORDER — SODIUM CHLORIDE 0.9% FLUSH
10.0000 mL | Freq: Two times a day (BID) | INTRAVENOUS | Status: DC
  Administered 2023-11-08 – 2023-11-11 (×6): 10 mL

## 2023-11-08 MED ORDER — VITAMIN C 500 MG PO TABS
500.0000 mg | ORAL_TABLET | Freq: Every day | ORAL | Status: DC
  Administered 2023-11-09 – 2023-11-23 (×15): 500 mg via ORAL
  Filled 2023-11-08 (×15): qty 1

## 2023-11-08 MED ORDER — SODIUM CHLORIDE 0.9 % IV BOLUS
500.0000 mL | Freq: Once | INTRAVENOUS | Status: AC
  Administered 2023-11-08: 500 mL via INTRAVENOUS

## 2023-11-08 NOTE — Plan of Care (Signed)
  Problem: Consults Goal: RH GENERAL PATIENT EDUCATION Description: See Patient Education module for education specifics. Outcome: Progressing   Problem: RH BOWEL ELIMINATION Goal: RH STG MANAGE BOWEL WITH ASSISTANCE Description: STG Manage Bowel with mod I  Assistance. Outcome: Progressing Goal: RH STG MANAGE BOWEL W/MEDICATION W/ASSISTANCE Description: STG Manage Bowel with Medication with mod I Assistance. Outcome: Progressing   Problem: RH BLADDER ELIMINATION Goal: RH STG MANAGE BLADDER WITH ASSISTANCE Description: STG Manage Bladder With toileting / mod I Assistance Outcome: Progressing Goal: RH STG MANAGE BLADDER WITH MEDICATION WITH ASSISTANCE Description: STG Manage Bladder With Medication With mod I Assistance. Outcome: Progressing   Problem: RH SAFETY Goal: RH STG ADHERE TO SAFETY PRECAUTIONS W/ASSISTANCE/DEVICE Description: STG Adhere to Safety Precautions With cues Assistance/Device. Outcome: Progressing   Problem: RH PAIN MANAGEMENT Goal: RH STG PAIN MANAGED AT OR BELOW PT'S PAIN GOAL Description: < 4 with prns Outcome: Progressing   Problem: RH KNOWLEDGE DEFICIT GENERAL Goal: RH STG INCREASE KNOWLEDGE OF SELF CARE AFTER HOSPITALIZATION Description: Patient and wife will be able to manage care at discharge using educational resources for medication, skin care,dietary modification and fall prevention independently Outcome: Progressing   Problem: Education: Goal: Ability to describe self-care measures that may prevent or decrease complications (Diabetes Survival Skills Education) will improve Outcome: Progressing Goal: Individualized Educational Video(s) Outcome: Progressing   Problem: Coping: Goal: Ability to adjust to condition or change in health will improve Outcome: Progressing   Problem: Fluid Volume: Goal: Ability to maintain a balanced intake and output will improve Outcome: Progressing   Problem: Health Behavior/Discharge Planning: Goal: Ability  to identify and utilize available resources and services will improve Outcome: Progressing Goal: Ability to manage health-related needs will improve Outcome: Progressing   Problem: Metabolic: Goal: Ability to maintain appropriate glucose levels will improve Outcome: Progressing   Problem: Nutritional: Goal: Maintenance of adequate nutrition will improve Outcome: Progressing Goal: Progress toward achieving an optimal weight will improve Outcome: Progressing   Problem: Skin Integrity: Goal: Risk for impaired skin integrity will decrease Outcome: Progressing   Problem: Tissue Perfusion: Goal: Adequacy of tissue perfusion will improve Outcome: Progressing

## 2023-11-08 NOTE — Plan of Care (Signed)
  Problem: Consults Goal: RH GENERAL PATIENT EDUCATION Description: See Patient Education module for education specifics. Outcome: Progressing   Problem: RH BOWEL ELIMINATION Goal: RH STG MANAGE BOWEL WITH ASSISTANCE Description: STG Manage Bowel with mod I  Assistance. Outcome: Progressing Goal: RH STG MANAGE BOWEL W/MEDICATION W/ASSISTANCE Description: STG Manage Bowel with Medication with mod I Assistance. Outcome: Progressing   Problem: RH BLADDER ELIMINATION Goal: RH STG MANAGE BLADDER WITH ASSISTANCE Description: STG Manage Bladder With toileting / mod I Assistance Outcome: Progressing Goal: RH STG MANAGE BLADDER WITH MEDICATION WITH ASSISTANCE Description: STG Manage Bladder With Medication With mod I Assistance. Outcome: Progressing   Problem: RH SAFETY Goal: RH STG ADHERE TO SAFETY PRECAUTIONS W/ASSISTANCE/DEVICE Description: STG Adhere to Safety Precautions With cues Assistance/Device. Outcome: Progressing   Problem: Education: Goal: Ability to describe self-care measures that may prevent or decrease complications (Diabetes Survival Skills Education) will improve Outcome: Progressing Goal: Individualized Educational Video(s) Outcome: Progressing

## 2023-11-08 NOTE — Progress Notes (Addendum)
 Still does not have binder or TEDs--was significantly orthostatic felt weak, had flushing and  nausea with BP 72/49. Also noted to have some nausea on Friday and no therapy this weekend but did sit up about 4 hours on Saturday and Sunday per nursing.  Dantrium added on Friday and could be contributing. Will bolus with 500 cc IVF . Change vitamins to bedtime to avoid GI side effects. Check KUB and LFTs.

## 2023-11-08 NOTE — Progress Notes (Signed)
 Physical Therapy Session Note  Patient Details  Name: Christian Bailey MRN: [id number] Date of Birth: Jan 12, 1941  Today's Date: 11/08/2023 PT Individual Time: 3086-5784 PT Individual Time Calculation (min): 40 min   Short Term Goals: Week 2:  PT Short Term Goal 1 (Week 2): Pt will be able to perform bed mobility with mod assist PT Short Term Goal 1 - Progress (Week 2): Not met PT Short Term Goal 2 (Week 2): Pt will be able to perform bed <> w/c transfer with mod assist PT Short Term Goal 2 - Progress (Week 2): Not met PT Short Term Goal 3 (Week 2): Pt will be able to tolerate standing for weightbearing x 5 min PT Short Term Goal 3 - Progress (Week 2): Not met PT Short Term Goal 4 (Week 2): Pt will initiate PWC mobility PT Short Term Goal 4 - Progress (Week 2): Met Week 3:  PT Short Term Goal 1 (Week 3): Pt will perform bed mobility with mod A PT Short Term Goal 2 (Week 3): Pt will progress transfers to max A PT Short Term Goal 3 (Week 3): Pt will be able to tolerate x 5 min in standing for WB/postural control retraining/tone management  Skilled Therapeutic Interventions/Progress Updates:   Received pt sitting upright in bed. Pt reported "not doing well" and feeling nauseous suspect from taking too many medications at once - provided pt with emesis bag but no vomiting noted. Pt also reporting pain 7/10 in R heel. Encouraged OOB mobility, however pt refused due to nausea and pain. Offered getting dressed, however pt declined stating "I really don't feel like doing anything", then reported feeling "hot and dizzy" - BP: 72/49 and HR 82bpm. Reassessed and BP: 78/49. Donned knee high teds and reassessed BP: 115/59.  Pt requesting to get repositioned due to buttocks and heel discomfort. Scooted to [facility] with +2 assist using Trendelenburg bed position. Rolled L/R with max A using chuck pads and placed pillows under buttocks for pressure relief and donned Prevalon boots. Concluded session with pt sitting  upright in bed, needs within reach, and bed alarm on. Pt reported feeling much better and "refreshed" at end of session.   Therapy Documentation Precautions:  Precautions Precautions: Fall, Cervical Precaution/Restrictions Comments: decreased recall of precautions, not able to verbalize any when prompted Required Braces or Orthoses: Cervical Brace Cervical Brace: Soft collar Restrictions Weight Bearing Restrictions Per Provider Order: No  Therapy/Group: Individual Therapy Nicolas Barren Zaunegger Nena Bank PT, DPT 11/08/2023, 7:16 AM

## 2023-11-08 NOTE — Progress Notes (Signed)
 PROGRESS NOTE   Subjective/Complaints:  Patient reports he felt nauseated this morning, possibly after taking medications.  Appears nausea may be associated with orthostatic hypotension.  Reports he does not think his suppository was given, appears documented.  Has been having some discomfort in his right heel-not wearing Prevalon boots.   ROS:   Pt denies SOB, abd pain, CP, V/C/D, and vision changes + Heel disomfort + nausea- improved   Objective:   No results found.   Recent Labs    11/08/23 0612  WBC 8.1  HGB 10.1*  HCT 31.3*  PLT 235      Recent Labs    11/08/23 0612  NA 139  K 3.8  CL 102  CO2 29  GLUCOSE 146*  BUN 26*  CREATININE 1.33*  CALCIUM  9.3       Intake/Output Summary (Last 24 hours) at 11/08/2023 1015 Last data filed at 11/08/2023 4098 Gross per 24 hour  Intake 480 ml  Output 1850 ml  Net -1370 ml     Pressure Injury 10/28/23 Sacrum Medial Stage 1 -  Intact skin with non-blanchable redness of a localized area usually over a bony prominence. STAGE 1 ON SACRUM (Active)  10/28/23 1333  Location: Sacrum  Location Orientation: Medial  Staging: Stage 1 -  Intact skin with non-blanchable redness of a localized area usually over a bony prominence.  Wound Description (Comments): STAGE 1 ON SACRUM  Present on Admission:      Pressure Injury 10/28/23 Heel Right Deep Tissue Pressure Injury - Purple or maroon localized area of discolored intact skin or blood-filled blister due to damage of underlying soft tissue from pressure and/or shear. DTI on bottom of heel (Active)  10/28/23 1820  Location: Heel  Location Orientation: Right  Staging: Deep Tissue Pressure Injury - Purple or maroon localized area of discolored intact skin or blood-filled blister due to damage of underlying soft tissue from pressure and/or shear.  Wound Description (Comments): DTI on bottom of heel  Present on Admission:      Physical Exam: Vital Signs Blood pressure (!) 109/59, pulse 99, temperature 98 F (36.7 C), temperature source Oral, resp. rate 18, height 5\' 4"  (1.626 m), weight 60.6 kg, SpO2 98%.   General: awake, alert, laying in bed HENT: conjugate gaze; oropharynx moist CV: a little tachycardic, regular rhythm; no JVD Pulmonary: CTA B/L; no W/R/R- good air movement GI: soft, NT, ND, (+)BS Psychiatric: appropriate, appears a little frustrated GU- foley in place, clear yellow urine  Neurological: Ox3  MAS of 1+ - so doing better (was 2-3 prior to baclofen ) in RLE- better this AM  Skin: Heels covered with foam dressings B/L- R heel DTI and Sacral Stage I Not wearing prevalon/abd binder/teds this AM  MSK: Ue's 4/5 B/L throughout LE's- 2-/5 B/L-in HF/KE- 4-/5 DF/PF but RLE weaker than LLE   Prior exam Spasticity- MAS of 1+ to 2 in Ue's this Am and 3 in RLE- MAS worse this AM- 2-3 throughout Neurologic: Cranial nerves II through XII intact, motor strength is 4/5 in bilateral  bicep, tricep, grip,and finger ext and hand intrinsics, 3- RIght delt and 4/5 left delt, 3- bilateral  hip flexor, 3-  RIght and 4- left knee extensors, 4/5 bilateral ankle dorsiflexor and plantar flexor Sensory exam normal sensation to light touch and proprioception in bilateral upper and lower extremities Tone- 3+ a bilateral biceps triceps and BR, + Hoffman's bilateral 2/5 RIght 3+ left knee Also MAS 3 tone in RIght Knee flexors and extensors, MAS 1 at the left knee Clonus at bilateral ankles  Musculoskeletal: Full range of motion in all 4 extremities. No joint swelling      Assessment/Plan: 1. Functional deficits which require 3+ hours per day of interdisciplinary therapy in a comprehensive inpatient rehab setting. Physiatrist is providing close team supervision and 24 hour management of active medical problems listed below. Physiatrist and rehab team continue to assess barriers to discharge/monitor patient  progress toward functional and medical goals  Care Tool:  Bathing              Bathing assist Assist Level: Total Assistance - Patient < 25%     Upper Body Dressing/Undressing Upper body dressing   What is the patient wearing?: Pull over shirt    Upper body assist Assist Level: Moderate Assistance - Patient 50 - 74%    Lower Body Dressing/Undressing Lower body dressing      What is the patient wearing?: Underwear/pull up, Pants     Lower body assist Assist for lower body dressing: Dependent - Patient 0%     Toileting Toileting    Toileting assist Assist for toileting: Dependent - Patient 0%     Transfers Chair/bed transfer  Transfers assist     Chair/bed transfer assist level: Maximal Assistance - Patient 25 - 49%     Locomotion Ambulation   Ambulation assist   Ambulation activity did not occur: Safety/medical concerns  Assist level: 2 helpers Assistive device: [name] Max distance: 10'   Walk 10 feet activity   Assist  Walk 10 feet activity did not occur: Safety/medical concerns  Assist level: 2 helpers Assistive device: Walker-Eva   Walk 50 feet activity   Assist Walk 50 feet with 2 turns activity did not occur: Safety/medical concerns         Walk 150 feet activity   Assist Walk 150 feet activity did not occur: Safety/medical concerns         Walk 10 feet on uneven surface  activity   Assist Walk 10 feet on uneven surfaces activity did not occur: Safety/medical concerns         Wheelchair     Assist Is the patient using a wheelchair?: Yes Type of Wheelchair: Power    Wheelchair assist level: Supervision/Verbal cueing Max wheelchair distance: 200    Wheelchair 50 feet with 2 turns activity    Assist        Assist Level: Supervision/Verbal cueing   Wheelchair 150 feet activity     Assist      Assist Level: Supervision/Verbal cueing   Blood pressure (!) 109/59, pulse 99, temperature 98  F (36.7 C), temperature source Oral, resp. rate 18, height 5\' 4"  (1.626 m), weight 60.6 kg, SpO2 98%.  Medical Problem List and Plan: 1. 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  Antithrombotics: -DVT/anticoagulation:  Mechanical: Sequential compression devices, below knee Bilateral lower extremities 4/23- will see if Dr Ellery Guthrie will allow us  to start Lovenox - he said yes, so will start. 30mg  daily.             -antiplatelet therapy: N/A 3. Pain Management: tylenol  prn. Voltaren  QID, lyrica  50mg  BID, flexeril  PRN -10/24/23 leg muscle soreness likely from PT/OT this week; will start muscle rub cream 4/28- still c/o muscle soreness in legs- con't pain regimen as needed 4/30- still sore, but don't want Flexeril  since likely caused confusion- con't tylenol  prn- can use muscle rub which is ordered 5/1- denies a lot of pain- mainly sore form spasticity/working out 5/5- pain yesterday- not today-  5/7- will add Tramadol  50 mg q8 hours prn if pain won't go away otherwise-  due to renal function- doing TID prn- hopefully baclofen  helps pain in RLE as well 5/8- pain better since started baclofen - didn't need tramadol  5/9- will add Dantrolene 50 mg at bedtime and see if tolerates- if so, will con't to increase the dose to help spasticity and reduce chance of sedation/side effects  -11/07/23 very delirious yesterday per pt, will stop baclofen  and leave Dantrolene for now; monitor -5/12 continue current regimen, mentation  sounds like it is better today 4. Mood/Behavior/Sleep: LCSW to follow up for evaluation and support when appropriate --Delirium precautions--His mental status has cleared but will monitor for worsening with transfer to new unit .pt has not required prn meds for agitation in several days  Monitor sleep wake cycle             -antipsychotic agents: Change haldol  q 6 hrs prn to Seroquel  12.5mg  tid prn  -Restoril  7.5mg  nightly, cymbalta  20mg  daily 4/23-4/25 no delirium so far 4/28- having some delirium this AM? Not severe, but will monitor since has had in recent past 4/29- still confused this AM- but per nursing , was less confused overnight after Restoril  stopped- still pleasantly confused this AM when first woke up- didn't open eyes.  5/9 Aox3 this AM -11/07/23 delirious yesterday, stopped baclofen  as above.  5. Neuropsych/cognition: This patient is usually  capable of making decisions on his own behalf. 6. Skin/Wound Care: Routine pressure relief measures. 7. Fluids/Electrolytes/Nutrition:  Monitor I/O. Routine labs. Continue vitamins/supplements. 8. 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 Willye Harvey, per note, leave in place for 7-10d, Consider resuming In-N-Out catheterization for management of his neurogenic bladder at that time. Recommend using a coud catheter for intermittent catheterization (when resumed).  4/28- will leave foley for 7-10 days 10/31/23 foley likely ready to come out tomorrow? Placed 4/25 5/5- will d/c foley in AM after speaking with pt 5/6- pt wants ot do what wife wants- cannot reach wife to discuss foley removal- pt doesn't want to do cathing and sounds  like would rather keep foley- but will discuss with him further 5/7- pt and wife have decided they don't want to remove foley -  they don't want ot go back to in/out caths-  since wasn't emptying before- they want a chronic foley- when offered to remove foley just for rehab, to see if he gets return, he is scared will get bleeding again, due to false passage already had and declined 5/12 Foley draining clear yellow urine 10.  Diabetes- A1c 8.0-  with peripheral neuropathy by hx only on Jardiance at home, use SSI in the hospital  -10/23/23 CBGs variable but likely d/t steroids; cont SSI for now 4/29- CBG's very labile in last 24 hours 99-383- - it concerns me to start Jardiance since of Foley and risk of UTI's- also for starting Tradjenta  since BG was 99 last night- don't want to drop BG too much 4/30- Will add Tradjenta  since BG's still running 150-242- will monitor closely for CBG hypoglycemia-   10/30/23 CBGs looking great!  -10/31/23 CBGs a little higher the last day, but monitor for trend  5/5- Cbgs all less than 200- con't to monitor  5/6- CBGs 130s- 180s- 5/7-5/9 CBG's 128 to 220- had snack yesterday- still snacking in afternoon -5/10-11/25 CBGs great except for afternoon d/t snacks. Leave meds alone for now -5/12 fair glucose control, continue current regimen and monitor. CBG (last 3)  Recent Labs    11/07/23 1714 11/07/23 2043 11/08/23 0551  GLUCAP 212* 185* 149*     11.  Spasticity affecting LEs> UE and R>L , aggressive ROM  with PT, consider oral meds  4/22- starting baclofen  5 mg TID- with his Cr 1.50- has been running 1.68 to 2.30  4/24- will not increase baclofen  due to Renal issues 5/9- will add Dantrolene 50 mg at bedtime to reduce chance of spasticity but also reduce chance of side effects/sedation- - will monitor -11/07/23 stopped baclofen  d/t delirium, cont dantrolene -5/12 continue dantrolene, continue hold baclofen  12.  PAD s/p SFA and Iliac stenting, continue metoprolol  50mg   daily  13. CKD3b? With AKI -4/22- Pt's Cr down to 1.50 from 2.29 2 days ago and 1.68 yesterday- will monitor 2x/week to see if will reduce further or be labile.   4/24- Cr 1.84- and BUN stable at 55- is drinking per pt  4/28- Cr 1.63- doing better 4/29- Cr 1.59 after 12+ hours of IVF's- and BUN very slightly down to 70 from 72-   5/1- Cr down to 1.43 and BUN down to 53  5/5- Cr 1.51 and BUN down to 37- drinking more!  5/8- Cr 1.8- up from 1.51- but BUN 34 down from 37- will push fluids  5/12 creatinine and BUN much lower at 1.33/26-improved 14. Questionable neurogenic bowel: continue miralax  32g daily, senokot s 2 tabs qAM  5/1- LBM this Am- was accident- might need bowel program 5/2- Placed on suppository nightly with possible dig stim- had multiple BM's during/after bowel program- if continues like this, will need dig stim.   -10/31/23 LBM last night x2 5/5- LBM last night with bowel program 5/6- BM last evening- no bowel note for last few nights seen 5/7- LBM 2 nights ago- no bowel note- d/w nursing coordinator 11/07/23 LBM last night 5/12 LBM night before last.  Patient reports he was not given his suppository last night-appears documented. KUB and LFTS ordered 15. HLD: crestor  20mg  nightly 16. Delirium/Confusion 4/29- yesterday given IVF's for this- checked U/A and Cx- which showed mod leuks, however rate bacteria and very little WBC's 6-10- which is not a UTI in most cases- will d/w pharmacy-  -pt still pleasantly confused this AM- but  didn't even open eyes, was so sleepy, so hard to tease out what is due to sleepiness and what is actual delirium- per nurse, much better last night-  4/30- pt Ox3 this AM- very with it- likely flexeril  based on chart review- given for pain since on no pain meds- will keep off Flexeril  and monitor Sx's  5/5- Sx's resolved  -11/07/23 SEE ABOVE 17. Hx of HTN with severe hypotension 4/30- pt's BP dropped to 79 systolic yesterday when sitting with OT- not clear  if had TEDS/ACE wraps- will reduce Metoprolol  XL to 12.5 mg daily- from 50 mg daily and see if that helps- don't want ot add midodrine  quite yet.  5/1- Adding midodrine  5 mg TID and adding abd binder- couldn't tolerate ACE wraps at all- just TED's-   5/2- BP yesterday was less symptomatic- but still dropped some- con't regimen -5/3-4/25 BPs a bit better recently; monitor 5/6-5/11 BP doing better- con't regimen 5/12 orthostatic hypotension noted this morning, bolus IVF 500cc. Vitals:   11/04/23 0456 11/04/23 1305 11/04/23 1616 11/04/23 2004  BP: 134/67 129/72 (!) 106/56 (!) 118/52   11/05/23 0622 11/05/23 1258 11/06/23 0538 11/06/23 1327  BP: (!) 153/64 (!) 126/52 (!) 109/49 109/60   11/06/23 1953 11/07/23 0344 11/07/23 1950 11/08/23 0553  BP: (!) 178/67 126/81 (!) 125/57 (!) 109/59     18. Atelectasis 4/30- sounds coarse- asked pt to use ICS regularly- is at bedside- also described "spitting things up"_ had "spit cup"- at bedside-  had barely anything in it. 5/5- not spitting up anymore  19. R heel DTI and Stage I on sacrum 5/2- is new- ordered prevalon boots as well as low air loss mattress and needs to be turned q2 hours- placed order  5/8- pt not able to tolerate air mattress- turn q2 hours and prevalons for Heel DTI- d/w nursing- just removed this AM 5/12 patient asked for device to keep pressure off his heel, Prevalon boots at bedside. Will chek with nursing regarding use of these boots at night.  20. Spasticity  5/5- will d/w wife tomorrow about restarting Baclofen  vs Dantrolene.   5/6- attempted to call wife again x2- cannot get her 5/7- was able to see wife yesterday and discussed- we will retry Pt on Baclofen  5 mg TID for now- not a higher dose due to renal issues-  and monitor for confusion/delirium- if gets confused, will try Dantrolene.   5/8- no confusion and RLE much less pain- so think it's working 5/9- will add Dantrolene as above 21. Severe malnutrition  5/5- per  Dietitian- will do supplements 22. Mild tachycardia 5/8- per pt, has ALWAYS had tachycardia- has been worked up- and nothing found- usually 90s-100's per pt- which is the same here- will monitor    LOS: 21 days A FACE TO FACE EVALUATION WAS PERFORMED  Lylia Sand 11/08/2023, 10:15 AM

## 2023-11-08 NOTE — Progress Notes (Signed)
 IP Rehab Bowel Program Documentation   Bowel Program Start time [id number]  Dig Stim Indicated? Yes  Dig Stim Prior to Suppository or mini Enema X 10  Output from dig stim: Minimal  Ordered intervention: Suppository Yes , mini enema No ,   Repeat dig stim after Suppository or Mini enema  - notified oncoming nurse of bowel program is  incomplete at tis time , oncoming nurse to complete  Output? none  Bowel Program Complete? No ,   handoff given Camilo Cella, LPN  Patient Tolerated? Yes

## 2023-11-08 NOTE — Progress Notes (Signed)
 Occupational Therapy Session Note  Patient Details  Name: Christian Bailey MRN: [id number] Date of Birth: [birth year]  Today's Date: 11/08/2023 OT Individual Time: 1105-1200 & 1415-1530 OT Individual Time Calculation (min): 55 min & 75 min   Short Term Goals: Week 3:  OT Short Term Goal 1 (Week 3): Pt will complete dynamic sitting with SBA in preparation for ADL tasks OT Short Term Goal 2 (Week 3): Pt will complete bed mobility with Mod A in preparation for ADLs OT Short Term Goal 3 (Week 3): Pt will complete UB dressing at Min A consistently throughout sessions  Skilled Therapeutic Interventions/Progress Updates:      Therapy Documentation Precautions:  Precautions Precautions: Fall, Cervical Precaution/Restrictions Comments: decreased recall of precautions, not able to verbalize any when prompted Required Braces or Orthoses: Cervical Brace Cervical Brace: Soft collar Restrictions Weight Bearing Restrictions Per Provider Order: No Session 1 General: "I was nauseated this morning." Pt supine in bed upon OT arrival, agreeable to OT session. Nag hooking pt up to IV bolus for fluids d/t low BP this AM.   Vital Signs: BP rechecked at end of session, 111/66, 88 bpm, checked in supine position  Pain: no pain reported  ADL: OT providing skilled intervention fro ADL care and tone/spasticity management. OT noted pt soiled of urine, pt cath kinked under pants, OT fixing cath kink and urine flowing through freely, nsg notified. OT changing soiled pants/linens at total A at bed level, pt assisting by rolling Lt and Rt at Max A various times using bed rails to maintain positioning. OT donned abdominal binder at total A bed level with rolling technique for BP management. OT providing stretching techniques to BLE in order to prevent increased tone in BLE. OT stretching pt in all planes for BLE with noting decreased muscle tightness at end of stretching.     Pt supine in bed with bed alarm  activated, 2 bed rails up, call light within reach and 4Ps assessed.   Session 2 General: "Thank you for helping me" Pt supine in bed upon OT arrival, agreeable to OT session.  Vital Signs: initially BP 94/56, increased to 98/54 after donning thigh high TEDs vs knee highs and ACE wrapping as well as abdominal binder, ended session at 96/49 after seated EOB with all items donned.  Pain:  unrated pain reported in anterior lower leg, skin check, positioning, activity, intermittent rest breaks, distractions provided for pain management, pt reports tolerable to proceed.    ADL: OT providing skilled intervention on ADL retraining in order to increase independence with tasks and increase activity tolerance. Pt completed the following tasks at the current level of assist: Bed mobility: Max A +1, OT instructing pt on side lying to supine by using elbow to prop onto side while OT managing BLE, supine><EOB  Balance: Pt completed a variety of seated activities in order to promote increased balance strategies with ADL participation. Pt completed all activities at EOB level with  intermittent supported/unsupported BUE at EOB. Pt completed exercises in order to increase blood flow and BP: -heel pumps with AAROM -forward kicks with AAROM -forward punches   Pt completed various trials of above exercises to music in order to increase mood and OT encouraged pt to complete exercises to beat of music, pt requiring AAROM for exercises in order to decrease tone   Pt supine in bed with bed alarm activated, 2 bed rails up, call light within reach and 4Ps assessed. Verbal handoff to nsg for skin check. OT donned  PREVLON boots and positioned pillow under side of buttocks for pressure relief   Therapy/Group: Individual Therapy  Nila Barth, OTD, OTR/L 11/08/2023, 3:52 PM

## 2023-11-08 NOTE — Progress Notes (Addendum)
 PA aware of blood pressure 72/49- Therapy applied compression hose blood pressure went back up to 115/69 awaiting abdominal binder to arrive. Reinforced education on prevalon boots, abdominal binder and ted hose. Encouraged oral fluids. Patient verbalized understanding.  Randeen Busman, LPN

## 2023-11-09 DIAGNOSIS — G8250 Quadriplegia, unspecified: Secondary | ICD-10-CM | POA: Diagnosis not present

## 2023-11-09 LAB — GLUCOSE, CAPILLARY
Glucose-Capillary: 115 mg/dL — ABNORMAL HIGH (ref 70–99)
Glucose-Capillary: 179 mg/dL — ABNORMAL HIGH (ref 70–99)
Glucose-Capillary: 126 mg/dL — ABNORMAL HIGH (ref 70–99)
Glucose-Capillary: 258 mg/dL — ABNORMAL HIGH (ref 70–99)

## 2023-11-09 MED ORDER — MIDODRINE HCL 5 MG PO TABS
7.5000 mg | ORAL_TABLET | Freq: Three times a day (TID) | ORAL | Status: DC
  Administered 2023-11-09 – 2023-11-24 (×45): 7.5 mg via ORAL
  Filled 2023-11-09 (×30): qty 2
  Filled 2023-11-09: qty 1.5
  Filled 2023-11-09 (×15): qty 2

## 2023-11-09 MED ORDER — SIMETHICONE 80 MG PO CHEW
80.0000 mg | CHEWABLE_TABLET | Freq: Four times a day (QID) | ORAL | Status: DC
  Administered 2023-11-09 – 2023-11-15 (×26): 80 mg via ORAL
  Filled 2023-11-09 (×27): qty 1

## 2023-11-09 NOTE — Progress Notes (Signed)
 Physical Therapy Session Note  Patient Details  Name: Christian Bailey MRN: [id number] Date of Birth: 15-Nov-1940  Today's Date: 11/09/2023 PT Individual Time: 5784-6962 PT Individual Time Calculation (min): 71 min   Today's Date: 11/09/2023 PT Individual Time: 1305-1400 PT Individual Time Calculation (min): 55 min   Short Term Goals: Week 3:  PT Short Term Goal 1 (Week 3): Pt will perform bed mobility with mod A PT Short Term Goal 2 (Week 3): Pt will progress transfers to max A PT Short Term Goal 3 (Week 3): Pt will be able to tolerate x 5 min in standing for WB/postural control retraining/tone management  Skilled Therapeutic Interventions/Progress Updates:     1st Session: Pt received semi reclined in bed and agrees to therapy. Pt verbalizes frustrations with several staff related occurrences and PT provides active listening and constructive feedback. Pt also verbalizes pain in Rt heel and PT provides rest breaks, repositioning, and alerts RN to pain, who provides pain medication. PT assists to don bilateral thigh high ted hose and ace wraps to mid thigh. Pt performs supine to sit with logrolling technique and modA/maxA, with cues for body mechanics and sequencing. Pt initially retro pulses upon sitting up at EOB and is able to correct balance with increased time and cueing. Pt performs sit to stand from elevated bed with modA/maxA and stand step transfer to power WC with maxA and facilitation of lateral weight shifting, verbal cues for progression of each foot, and multimodal cues for positioning and sequencing. Pt drives WC to gym with modA for navigating crowded environment, and pt able to manage WC in open spaces. Upon arrival to dayroom, BP assessed with pt noted to be 70/54 and verbalizing some symptoms of hypotension, such as light headedness and blurred vision. PT retrieves abdominal binder and cues pt to recline in [facility] for several minutes, then BP reassessed at 88/47, and pt verbalizing  improved symptoms. Pt performs x2 reps of sit to stand with modA/maxA and no AD, with PT blocking both knees and providing manual assistance at hips to promote extension and anterior weight shifting. Verbal and tactile cues provided in standing for upright posture and increased glute engagement to stabilize core and improve balance. PT facilitates lateral weight shifting and trunk extension. Pt maintains standing ~2 minutes each bout, with extended seated rest break. Pt drives WC back to room. Left seated in power WC with all needs within reach.   2nd Session: Pt received supine in bed asleep. Awakens to verbal and tactile stimuli and agrees to therapy. No complaint of pain. Supine to sit with modA/maxA and cues for logrolling and body mechanics. Stand pivot transfer to [facility] requires maxA/totalA with pt noted to have less power and motor control in legs than previous transfers in AM session. Session focuses on therapeutic activity of WC management and driving throughout hospital and outside over unlevel and varying surfaces in open environment. Pt requires modA for management of power WC when maneuvering in elevators as well as several instance of assistance outdoors to prevent pt from driving off sidewalk. PT provides cues for safe management of WC and ensuring pt attends to task and potential safety hazards outdoors. Following, pt left seated in [facility] with all needs within reach.   Therapy Documentation Precautions:  Precautions Precautions: Fall, Cervical Precaution/Restrictions Comments: decreased recall of precautions, not able to verbalize any when prompted Required Braces or Orthoses: Cervical Brace Cervical Brace: Soft collar Restrictions Weight Bearing Restrictions Per Provider Order: No  Therapy/Group: Individual Therapy  Neva Barban, PT, DPT 11/09/2023, 3:45 PM

## 2023-11-09 NOTE — Progress Notes (Signed)
 Bowel program started by day shift. Repeated dig stim. Patient had bowel movement. Bowel program complete. Patient in bed with call bell within reach.

## 2023-11-09 NOTE — Progress Notes (Signed)
 Bowel program started with suppository x 1 dig stim. Patient had a large output. Patient tolerated well. Bowel program completed.

## 2023-11-09 NOTE — Progress Notes (Signed)
 Occupational Therapy Weekly Progress Note  Patient Details  Name: Christian Bailey MRN: [id number] Date of Birth: 1941/02/01  Beginning of progress report period: Nov 02, 2023 End of progress report period: Nov 09, 2023  Today's Date: 11/09/2023 OT Individual Time: 1000-1030 & 1445-1530 OT Individual Time Calculation (min): 30 min & 45 min   Patient has slowly progressing 2 of 3 short term goals.  Pt limited by muscle tone/spasticity and BUE function at baseline. Family training in shadowing therapy was 5/9, wife unable to provide physical care required for pt. SW looking at [facility] placements for further nsg/therapy care after IPR.  Patient continues to demonstrate the following deficits: muscle weakness and muscle joint tightness, decreased cardiorespiratoy endurance, impaired timing and sequencing, abnormal tone, and unbalanced muscle activation, and decreased sitting balance, decreased standing balance, decreased postural control, and decreased balance strategies and therefore will continue to benefit from skilled OT intervention to enhance overall performance with BADL and Reduce care partner burden.  Patient progressing toward long term goals..  Continue plan of care.  OT Short Term Goals Week 3:  OT Short Term Goal 1 (Week 3): Pt will complete dynamic sitting with SBA in preparation for ADL tasks OT Short Term Goal 1 - Progress (Week 3): Progressing toward goal OT Short Term Goal 2 (Week 3): Pt will complete bed mobility with Mod A in preparation for ADLs OT Short Term Goal 2 - Progress (Week 3): Not met OT Short Term Goal 3 (Week 3): Pt will complete UB dressing at Min A consistently throughout sessions OT Short Term Goal 3 - Progress (Week 3): Progressing toward goal Week 4:  OT Short Term Goal 1 (Week 4): Pt will complete UB dressing at Min A consistently throughout sessions OT Short Term Goal 2 (Week 4): Pt will complete dynamic sitting with SBA in preparation for ADL tasks OT Short Term  Goal 3 (Week 4): Pt will complete grooming at SBA seated in W/C  Skilled Therapeutic Interventions/Progress Updates:      Therapy Documentation Precautions:  Precautions Precautions: Fall, Cervical Precaution/Restrictions Comments: decreased recall of precautions, not able to verbalize any when prompted Required Braces or Orthoses: Cervical Brace Cervical Brace: Soft collar Restrictions Weight Bearing Restrictions Per Provider Order: No General: "Can I get back in bed?" Pt seated in W/C upon OT arrival, agreeable to OT.  Pain:  unrated pain reported in RLE, activity, intermittent rest breaks, distractions provided for pain management, pt reports tolerable to proceed.   ADL: OT providing skilled intervention on ADL retraining in order to increase independence with tasks and increase activity tolerance. Pt completed the following tasks at the current level of assist: Bed mobility: Mod A EOB>supine, able to sit EOB at SBA with BUE support  Transfers: Max A with transfer board transfer from W/C>bed with good anterior lean with VC for increased ability to transfer , VC as well for hand placement   Pt supine in bed with bed alarm activated, 2 bed rails up, call light within reach and 4Ps assessed.   Session 2 General: "You're a pleasure!" Pt seated in W/C upon OT arrival, agreeable to OT.  Pain: unrated pain reported in Rt heel, positioning, activity, intermittent rest breaks, distractions provided for pain management, pt reports tolerable to proceed.   Exercises: Pt completed 5 minutes of arm bike, 2.5 min forward, 2.5 min backward in order to increase BUE strength/ROM and endurance in preparation for increased independence in ADLs such as LB dressing. Extended Rest break after 2.5 min,  on light resistance.  Other Treatments: OT provided intervention of stretching BLE in all planes for spasticity/tone management when up in W/C. OT continuing to educate pt on pressure relief using tilt  feature in W/C.    Pt seated in W/C at end of session with W/C alarm donned, call light within reach and 4Ps assessed. OT placing PRAFO boots on in W/C d/t pain in heel, pt reporting relief with PRAFOs. Pt left in tilted position for pressure relief.    Therapy/Group: Individual Therapy  Nila Barth, OTD, OTR/L 11/09/2023, 3:58 PM

## 2023-11-09 NOTE — Progress Notes (Addendum)
 PROGRESS NOTE   Subjective/Complaints:  Pt reports vomited yesterday x1 10 minutes after getting AM meds- said got 10+ meds at once, and it appears has OH with BP 70's/40s at that time.    Did have good results with bowel program last night- per nursing and per pt.   Arnetta Lank was confused yesterday around when vomited, but sounds like could have been low BP. However no other confusion since stopped Baclofen      Per wound care, DTI not open- 3x1.5cm-   ROS:   Pt denies SOB, abd pain, CP, N/V/C/D, and vision changes  + Heel disomfort + nausea/vomiting- -hasn't recurred   Objective:   DG Abd 1 View Result Date: 11/08/2023 CLINICAL DATA:  Nausea EXAM: ABDOMEN - 1 VIEW COMPARISON:  None Available. FINDINGS: The bowel gas pattern is normal. No radio-opaque calculi or other significant radiographic abnormality are seen. IMPRESSION: Nonspecific ileus.  No obstruction Electronically Signed   By: Fredrich Jefferson M.D.   On: 11/08/2023 15:11     Recent Labs    11/08/23 0612  WBC 8.1  HGB 10.1*  HCT 31.3*  PLT 235      Recent Labs    11/08/23 0612  NA 139  K 3.8  CL 102  CO2 29  GLUCOSE 146*  BUN 26*  CREATININE 1.33*  CALCIUM  9.3       Intake/Output Summary (Last 24 hours) at 11/09/2023 0920 Last data filed at 11/09/2023 0800 Gross per 24 hour  Intake 700 ml  Output 1450 ml  Net -750 ml     Pressure Injury 10/28/23 Sacrum Medial Stage 1 -  Intact skin with non-blanchable redness of a localized area usually over a bony prominence. STAGE 1 ON SACRUM (Active)  10/28/23 1333  Location: Sacrum  Location Orientation: Medial  Staging: Stage 1 -  Intact skin with non-blanchable redness of a localized area usually over a bony prominence.  Wound Description (Comments): STAGE 1 ON SACRUM  Present on Admission: No     Pressure Injury 10/28/23 Heel Right Deep Tissue Pressure Injury - Purple or maroon localized area of  discolored intact skin or blood-filled blister due to damage of underlying soft tissue from pressure and/or shear. DTI on bottom of heel (Active)  10/28/23 1820  Location: Heel  Location Orientation: Right  Staging: Deep Tissue Pressure Injury - Purple or maroon localized area of discolored intact skin or blood-filled blister due to damage of underlying soft tissue from pressure and/or shear.  Wound Description (Comments): DTI on bottom of heel  Present on Admission: No    Physical Exam: Vital Signs Blood pressure (!) 100/55, pulse 95, temperature (!) 97.5 F (36.4 C), temperature source Oral, resp. rate 17, height 5\' 4"  (1.626 m), weight 60.6 kg, SpO2 99%.     General: awake, alert, appropriate,  just woke him up; NAD HENT: conjugate gaze; oropharynx moist CV: regular rate and rhythm- rate in 90's. ; no JVD Pulmonary: CTA B/L; no W/R/R- good air movement GI: soft, NT, ND, (+)BS- hypoactive Psychiatric: appropriate- doesn't appear confused- interactive once woke up Neurological: Ox3  GU- foley in place, clear yellow urine  Neurological: Ox3  MAS of 1+ -  so doing better (was 2-3 prior to baclofen ) in RLE- better this AM  Skin: Heels covered with foam dressings B/L- R heel DTI and Sacral Stage I Not wearing prevalon/abd binder/teds this AM  MSK: Ue's 4/5 B/L throughout LE's- 2-/5 B/L-in HF/KE- 4-/5 DF/PF but RLE weaker than LLE   Prior exam Spasticity- MAS of 1+ to 2 in Ue's this Am and 3 in RLE- MAS worse this AM- 2-3 throughout Neurologic: Cranial nerves II through XII intact, motor strength is 4/5 in bilateral  bicep, tricep, grip,and finger ext and hand intrinsics, 3- RIght delt and 4/5 left delt, 3- bilateral  hip flexor, 3- RIght and 4- left knee extensors, 4/5 bilateral ankle dorsiflexor and plantar flexor Sensory exam normal sensation to light touch and proprioception in bilateral upper and lower extremities Tone- 3+ a bilateral biceps triceps and BR, + Hoffman's  bilateral 2/5 RIght 3+ left knee Also MAS 3 tone in RIght Knee flexors and extensors, MAS 1 at the left knee Clonus at bilateral ankles  Musculoskeletal: Full range of motion in all 4 extremities. No joint swelling      Assessment/Plan: 1. Functional deficits which require 3+ hours per day of interdisciplinary therapy in a comprehensive inpatient rehab setting. Physiatrist is providing close team supervision and 24 hour management of active medical problems listed below. Physiatrist and rehab team continue to assess barriers to discharge/monitor patient progress toward functional and medical goals  Care Tool:  Bathing              Bathing assist Assist Level: Total Assistance - Patient < 25%     Upper Body Dressing/Undressing Upper body dressing   What is the patient wearing?: Pull over shirt    Upper body assist Assist Level: Moderate Assistance - Patient 50 - 74%    Lower Body Dressing/Undressing Lower body dressing      What is the patient wearing?: Underwear/pull up, Pants     Lower body assist Assist for lower body dressing: Dependent - Patient 0%     Toileting Toileting    Toileting assist Assist for toileting: Dependent - Patient 0%     Transfers Chair/bed transfer  Transfers assist     Chair/bed transfer assist level: Maximal Assistance - Patient 25 - 49%     Locomotion Ambulation   Ambulation assist   Ambulation activity did not occur: Safety/medical concerns  Assist level: 2 helpers Assistive device: [name] Max distance: 10'   Walk 10 feet activity   Assist  Walk 10 feet activity did not occur: Safety/medical concerns  Assist level: 2 helpers Assistive device: Walker-Eva   Walk 50 feet activity   Assist Walk 50 feet with 2 turns activity did not occur: Safety/medical concerns         Walk 150 feet activity   Assist Walk 150 feet activity did not occur: Safety/medical concerns         Walk 10 feet on uneven  surface  activity   Assist Walk 10 feet on uneven surfaces activity did not occur: Safety/medical concerns         Wheelchair     Assist Is the patient using a wheelchair?: Yes Type of Wheelchair: Power    Wheelchair assist level: Supervision/Verbal cueing Max wheelchair distance: 200    Wheelchair 50 feet with 2 turns activity    Assist        Assist Level: Supervision/Verbal cueing   Wheelchair 150 feet activity     Assist  Assist Level: Supervision/Verbal cueing   Blood pressure (!) 100/55, pulse 95, temperature (!) 97.5 F (36.4 C), temperature source Oral, resp. rate 17, height 5\' 4"  (1.626 m), weight 60.6 kg, SpO2 99%.  Medical Problem List and Plan: 1. 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  Antithrombotics: -DVT/anticoagulation:  Mechanical: Sequential compression devices, below knee Bilateral lower extremities 4/23- will see if Dr Ellery Guthrie will allow us  to start Lovenox - he said yes, so will start. 30mg  daily.             -antiplatelet therapy: N/A 3. Pain Management: tylenol  prn. Voltaren  QID, lyrica  50mg  BID, flexeril  PRN -10/24/23 leg muscle soreness likely from PT/OT this week; will start muscle rub cream 4/28- still c/o muscle soreness in legs- con't pain regimen as needed 4/30- still sore, but don't want Flexeril  since likely caused confusion- con't tylenol  prn- can use muscle rub which is ordered 5/1- denies a lot of pain- mainly sore form spasticity/working out 5/5- pain yesterday- not today-   5/7- will add Tramadol  50 mg q8 hours prn if pain won't go away otherwise-  due to renal function- doing TID prn- hopefully baclofen  helps pain in RLE as well 5/8- pain better since started baclofen - didn't need tramadol  5/9- will add Dantrolene 50 mg at bedtime and see if tolerates- if so, will con't to increase the dose to help spasticity and reduce chance of sedation/side effects  -11/07/23 very delirious yesterday per pt, will stop baclofen  and leave Dantrolene for now; monitor -5/12 continue current regimen, mentation sounds like it is better today 5/13- Dantrolene not making confused so far- will increase after 5 days- added baclofen  and Flexeril  to allergy list 4. Mood/Behavior/Sleep: LCSW to follow up for evaluation and support when appropriate --Delirium precautions--His mental status has cleared but will monitor for worsening with transfer to new unit .pt has not required prn meds for agitation in several days  Monitor sleep wake cycle             -antipsychotic agents: Change haldol  q 6 hrs prn to Seroquel  12.5mg  tid prn  -Restoril  7.5mg  nightly, cymbalta  20mg  daily 4/23-4/25 no delirium so far 4/28- having some delirium this AM? Not severe, but will monitor since has had in recent past 4/29- still confused this AM- but per nursing , was less confused overnight after Restoril  stopped- still pleasantly confused this AM when first woke up- didn't open eyes.  5/9 Aox3 this AM -11/07/23 delirious yesterday, stopped baclofen  as above.  5. Neuropsych/cognition: This patient is usually  capable of making decisions on his own behalf. 6. Skin/Wound Care: Routine pressure relief measures. 7. Fluids/Electrolytes/Nutrition:  Monitor I/O. Routine labs. Continue vitamins/supplements. 8. 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 Willye Harvey, per note, leave in place for 7-10d, Consider resuming In-N-Out catheterization for management of his neurogenic bladder at that time. Recommend using a coud catheter for intermittent catheterization (when resumed).  4/28- will leave foley for 7-10 days 10/31/23 foley likely ready to come out tomorrow? Placed 4/25 5/5- will d/c foley in AM after speaking with pt 5/6- pt wants ot do what wife wants- cannot reach wife to discuss foley removal- pt doesn't want to do cathing and sounds like would rather keep foley- but will discuss with him further 5/7- pt and wife have decided they don't want to remove foley - they don't want ot go back to in/out caths-  since wasn't emptying before- they want a chronic foley- when offered to remove foley just for rehab, to see if he gets return, he is scared will get bleeding again, due to false passage already had and declined 5/12 Foley draining clear yellow urine 10.  Diabetes- A1c 8.0-  with peripheral neuropathy by hx only on Jardiance at home, use SSI in the hospital  -10/23/23 CBGs variable but likely d/t steroids; cont SSI for now 4/29- CBG's very labile in last 24 hours 99-383- - it concerns me to start Jardiance since of Foley and risk of UTI's- also for starting Tradjenta  since BG was 99 last night- don't want to drop BG too much 4/30- Will add Tradjenta  since BG's still running 150-242- will monitor closely for CBG hypoglycemia-   10/30/23 CBGs looking great!  -10/31/23 CBGs a little higher the last day, but monitor for trend  5/5- Cbgs all less than 200- con't to monitor  5/6- CBGs 130s- 180s- 5/7-5/9 CBG's 128 to 220- had snack yesterday- still snacking in afternoon -5/10-11/25 CBGs great except for afternoon d/t snacks. Leave meds alone for now -5/12 fair glucose control, continue current regimen and  monitor. 5/13- great control except for afternoon- snacking- will d/w pt CBG (last 3)  Recent Labs    11/08/23 1705 11/08/23 2111 11/09/23 0609  GLUCAP 223* 106* 115*     11.  Spasticity affecting LEs> UE and R>L , aggressive ROM  with PT, consider oral meds  4/22- starting baclofen  5 mg TID- with his Cr 1.50- has been running 1.68 to 2.30  4/24- will not increase baclofen  due to Renal issues 5/9- will add Dantrolene 50 mg at bedtime to reduce chance of spasticity but also reduce chance of side effects/sedation- - will monitor -11/07/23 stopped baclofen  d/t delirium, cont dantrolene -5/12 continue dantrolene, continue hold baclofen  5/13- will increase Dantrolene after 5 days 12.  PAD s/p SFA and Iliac stenting, continue metoprolol  50mg  daily  13. CKD3b? With AKI -4/22- Pt's Cr down to 1.50 from 2.29 2 days ago and 1.68 yesterday- will monitor 2x/week to see if will reduce further or be labile.   4/24- Cr 1.84- and BUN stable at 55- is drinking per pt  4/28- Cr 1.63- doing better 4/29- Cr 1.59 after 12+ hours of IVF's- and BUN very slightly down to 70 from 72-   5/1- Cr down to 1.43 and BUN down to 53  5/5- Cr 1.51 and BUN down to 37- drinking more!  5/8- Cr 1.8- up from 1.51- but BUN 34 down from 37- will push fluids  5/12 creatinine and BUN much lower at 1.33/26-improved 14. Questionable neurogenic bowel: continue miralax  32g daily, senokot s 2 tabs qAM  5/1- LBM this  Am- was accident- might need bowel program 5/2- Placed on suppository nightly with possible dig stim- had multiple BM's during/after bowel program- if continues like this, will need dig stim.   -10/31/23 LBM last night x2 5/5- LBM last night with bowel program 5/6- BM last evening- no bowel note for last few nights seen 5/7- LBM 2 nights ago- no bowel note- d/w nursing coordinator 11/07/23 LBM last night 5/12 LBM night before last.  Patient reports he was not given his suppository last night-appears documented. KUB and  LFTS ordered 5/13- LFTs look good- having nightly BM's with bowel program- need to teach wife to do 15. HLD: crestor  20mg  nightly 16. Delirium/Confusion 4/29- yesterday given IVF's for this- checked U/A and Cx- which showed mod leuks, however rate bacteria and very little WBC's 6-10- which is not a UTI in most cases- will d/w pharmacy-  -pt still pleasantly confused this AM- but didn't even open eyes, was so sleepy, so hard to tease out what is due to sleepiness and what is actual delirium- per nurse, much better last night-  4/30- pt Ox3 this AM- very with it- likely flexeril  based on chart review- given for pain since on no pain meds- will keep off Flexeril  and monitor Sx's  5/5- Sx's resolved  -11/07/23 SEE ABOVE 17. Hx of HTN with severe hypotension 4/30- pt's BP dropped to 79 systolic yesterday when sitting with OT- not clear if had TEDS/ACE wraps- will reduce Metoprolol  XL to 12.5 mg daily- from 50 mg daily and see if that helps- don't want ot add midodrine  quite yet.  5/1- Adding midodrine  5 mg TID and adding abd binder- couldn't tolerate ACE wraps at all- just TED's-   5/2- BP yesterday was less symptomatic- but still dropped some- con't regimen -5/3-4/25 BPs a bit better recently; monitor 5/6-5/11 BP doing better- con't regimen 5/12 orthostatic hypotension noted this morning, bolus IVF 500cc. 5/13- Will increase Midodrine  to 7.5 mg TID Vitals:   11/04/23 2004 11/05/23 0622 11/05/23 1258 11/06/23 0538  BP: (!) 118/52 (!) 153/64 (!) 126/52 (!) 109/49   11/06/23 1327 11/06/23 1953 11/07/23 0344 11/07/23 1950  BP: 109/60 (!) 178/67 126/81 (!) 125/57   11/08/23 0553 11/08/23 1550 11/08/23 1922 11/09/23 0436  BP: (!) 109/59 (!) 114/59 (!) 111/49 (!) 100/55     18. Atelectasis 4/30- sounds coarse- asked pt to use ICS regularly- is at bedside- also described "spitting things up"_ had "spit cup"- at bedside-  had barely anything in it. 5/5- not spitting up anymore  19. R heel DTI and  Stage I on sacrum 5/2- is new- ordered prevalon boots as well as low air loss mattress and needs to be turned q2 hours- placed order  5/8- pt not able to tolerate air mattress- turn q2 hours and prevalons for Heel DTI- d/w nursing- just removed this AM 5/12 patient asked for device to keep pressure off his heel, Prevalon boots at bedside. Will chek with nursing regarding use of these boots at night.  20. Spasticity  5/5- will d/w wife tomorrow about restarting Baclofen  vs Dantrolene.   5/6- attempted to call wife again x2- cannot get her 5/7- was able to see wife yesterday and discussed- we will retry Pt on Baclofen  5 mg TID for now- not a higher dose due to renal issues-  and monitor for confusion/delirium- if gets confused, will try Dantrolene.   5/8- no confusion and RLE much less pain- so think it's working 5/9- will add Dantrolene as above  21. Severe malnutrition  5/5- per Dietitian- will do supplements 22. Mild tachycardia 5/8- per pt, has ALWAYS had tachycardia- has been worked up- and nothing found- usually 90s-100's per pt- which is the same here- will monitor 23. Ileus???  5/13- reached out to Radiology about pt's questionable ileus- not clear in Radiology report   I spent a total of 52   minutes on total care today- >50% coordination of care- due to  D/w Radiology about possible ileus esp since had a lot of gas that stopped- is having BM's- labs looking better- and LFTs good- also  reviewed multiple notes including WOC- DTI looking stable.  Also team conference to f/u on progress \  Addendum- called Radiology to verify doesn't have ileus per se, base don xray read- since bowel gas pattern is normal- they agreed it's more that has a lot of gas, but no loops dilated- so will put on Simethicone  QID LOS: 22 days A FACE TO FACE EVALUATION WAS PERFORMED  Alta Goding 11/09/2023, 9:20 AM

## 2023-11-09 NOTE — Consult Note (Addendum)
 WOC Nurse wound follow up Refer to previous WOC progress notes on 5/9.  Right heel remains with intact skin over dark red-purple Deep tissue pressure injury. 3X1.5cm. Present on admission: No   No open wound or drainage.  Prevalon boot is in place to reduce pressure when in bed.  Topical treatment orders have been provided for bedside nurses to protect from further injury as follows:  Foam dressing to right heel, change Q 3 days or PRN soiling.  WOC team will re-assess the location weekly to determine if a change in the plan of care is indicated at that time.   Thank-you,  Wiliam Harder MSN, RN, CWOCN, [place], CNS [phone]

## 2023-11-09 NOTE — Progress Notes (Signed)
 Patient ID: Christian Bailey, male   DOB: February 15, 1941, 83 y.o.   MRN: [id number]  Updated pt and wife regarding team conference and slow gains made this week in therapies. Wife feels she can not provide the level of care pt will require at home. She would like to see if [facility] could provide a SNF bed for him and also [facility]. Will send FL2 and see if can offer a bed.

## 2023-11-10 DIAGNOSIS — G959 Disease of spinal cord, unspecified: Secondary | ICD-10-CM | POA: Diagnosis not present

## 2023-11-10 DIAGNOSIS — G8250 Quadriplegia, unspecified: Secondary | ICD-10-CM | POA: Diagnosis not present

## 2023-11-10 DIAGNOSIS — F54 Psychological and behavioral factors associated with disorders or diseases classified elsewhere: Secondary | ICD-10-CM | POA: Diagnosis not present

## 2023-11-10 LAB — GLUCOSE, CAPILLARY
Glucose-Capillary: 93 mg/dL (ref 70–99)
Glucose-Capillary: 299 mg/dL — ABNORMAL HIGH (ref 70–99)
Glucose-Capillary: 92 mg/dL (ref 70–99)
Glucose-Capillary: 222 mg/dL — ABNORMAL HIGH (ref 70–99)

## 2023-11-10 MED ORDER — DANTROLENE SODIUM 25 MG PO CAPS
50.0000 mg | ORAL_CAPSULE | Freq: Two times a day (BID) | ORAL | Status: DC
  Administered 2023-11-10 – 2023-11-16 (×13): 50 mg via ORAL
  Filled 2023-11-10 (×13): qty 2

## 2023-11-10 NOTE — Progress Notes (Signed)
 Occupational Therapy Session Note  Patient Details  Name: Christian Bailey MRN: [id number] Date of Birth: 03-18-1941  Today's Date: 11/10/2023 OT Individual Time: 1000-1100 & 1415-1530 OT Individual Time Calculation (min): 60 min & 75 min   Short Term Goals: Week 4:  OT Short Term Goal 1 (Week 4): Pt will complete UB dressing at Min A consistently throughout sessions OT Short Term Goal 2 (Week 4): Pt will complete dynamic sitting with SBA in preparation for ADL tasks OT Short Term Goal 3 (Week 4): Pt will complete grooming at SBA seated in W/C  Skilled Therapeutic Interventions/Progress Updates:      Therapy Documentation Precautions:  Precautions Precautions: Fall, Cervical Precaution/Restrictions Comments: decreased recall of precautions, not able to verbalize any when prompted Required Braces or Orthoses: Cervical Brace Cervical Brace: Soft collar Restrictions Weight Bearing Restrictions Per Provider Order: No Session 1 General: "You have me watching hockey now!" Pt seated in W/C upon OT arrival, agreeable to OT.  Pain: no pain reported  Balance: OT assisting with transfer W/C><mat table in preparation for activities at total A for lateral transfer with transfer board. OT providing skilled intervention for variety of dynamic sitting activities in order to promote increased balance strategies with ADL participation. Pt completed all activities at seated EOM level with intermittent supported/unsupported sitting. Pt completed exercises listed below: -tossing/retrieving horse shoes with LUE at SBA and intermittent CGA seated, crossing midline and reaching with reacher -bicep curls with 2# dowel rod at SBA seated -triceps extension with theraband (increased LOB with task, OT providing Min A to correct sitting) -shoulder raises to end range (45*) with 2# dowel rod -seated rows with 2# dowel rod Pt CGA-SBA with VC to correct postural instability for all exercises except triceps  extension, requiring more assist   Other Treatments: OT issued PROM HEP for others to follow when pt D/C this facility in order to maintain BLE function and decrease tone/spasticity. OT went through HEP with pt to explain movements and purpose of PROM/stretching and put in white binder.   Pt seated in W/C at end of session with W/C alarm donned, call light within reach and 4Ps assessed. Wife present at end of session   Session 2 General: "I enjoyed this workout!" Pt seated in W/C upon OT arrival, agreeable to OT.  Pain: no pain reported  ADL: OT providing skilled intervention on ADL retraining in order to increase independence with tasks and increase activity tolerance. Pt completed the following tasks at the current level of assist: Bed mobility: Max A, OT educating pt to prop self onto elbow while OT managing BLE Transfers: total A lateral transfer from W/C><EOB with verbal and tactile cues for anterior lean  Exercises: OT providing skilled intervention for Pt to complete the following exercise circuit in order to improve functional activity, strength and endurance to prepare for ADLs such as bathing. Pt completed the following exercises in seated position with no noted LOB/SOB and various repetitions on each exercise: -seated AAROM marches -hip adduction -hip adduction -leg press -hitting ball with 2# dowel rod   Pt supine in bed with bed alarm activated, 2 bed rails up, call light within reach and 4Ps assessed.    Therapy/Group: Individual Therapy  Nila Barth, OTD, OTR/L 11/10/2023, 4:18 PM

## 2023-11-10 NOTE — Progress Notes (Signed)
 Physical Therapy Session Note  Patient Details  Name: Christian Bailey MRN: [id number] Date of Birth: 1940-10-30  Today's Date: 11/10/2023 PT Individual Time: 6578-4696 PT Individual Time Calculation (min): 69 min   Short Term Goals: Week 3:  PT Short Term Goal 1 (Week 3): Pt will perform bed mobility with mod A PT Short Term Goal 2 (Week 3): Pt will progress transfers to max A PT Short Term Goal 3 (Week 3): Pt will be able to tolerate x 5 min in standing for WB/postural control retraining/tone management  Skilled Therapeutic Interventions/Progress Updates:     Pt received semi reclined and agrees to therapy. Reports 1/10 pain in Rt heel. Number not provided. PT provides repositioning and rest breaks to manage pain. PT assists to don abdominal binder and knee high ted hose prior to mobility. Supine to sit with maxA and cues for logrolling and positioning. Pt performs stand step transfer from bed to [facility] with totalA and very little control of legs, with PT essentially lifting pt from floor during transfer. PT assists to maneuver WC out of room, then pt drives WC x250' with cues for function management and safety. PT then assists pt to position WC in preparation for transfer to Nustep. Pt completes sit to stand with maxA, then maintains standing ~1 minute to work on standing endurance and WB through BLEs. PT assists for lateral weight shifting and gradual transfer to Nustep. Pt completes x10:00 on Nustep to work on dissociation of upper and lower extremities with trunk, as well as reciprocal coordination training and endurance training. PT provides verbal and tactile cues for NM feedbakc, as well as cues for hand and foot placement and completing full available ROM. Pt completes additional x4:00 with just legs to challenge lower extremities. In total pt completes 634 steps with average steps per minutes ~30. Following, pt attempts ambulation with PT positioned in front of pt and providing maxA at hips to  promote extension and upright posture. Pt us  unable to maintain trunk control or motor control through legs to effectively ambulate, and requires maxA +2 to take several steps during transfer back to WC. Pt drives WC back to room. Left reclined in power WC with alarm intact and all needs within reach.   Therapy Documentation Precautions:  Precautions Precautions: Fall, Cervical Precaution/Restrictions Comments: decreased recall of precautions, not able to verbalize any when prompted Required Braces or Orthoses: Cervical Brace Cervical Brace: Soft collar Restrictions Weight Bearing Restrictions Per Provider Order: No   Therapy/Group: Individual Therapy  Neva Barban, PT DPT 11/10/2023, 3:44 PM

## 2023-11-10 NOTE — Progress Notes (Signed)
 IP Rehab Bowel Program Documentation   Bowel Program Start time [phone]  Dig Stim Indicated? No  Dig Stim Prior to Suppository or mini Enema X 1   Output from dig stim: n/a    Ordered intervention: Suppository Yes , mini enema No ,   Repeat dig stim after Suppository or Mini enema  X 0,  Output? Moderate   Bowel Program Complete? Yes , handoff given No  Patient Tolerated? Yes

## 2023-11-10 NOTE — Plan of Care (Signed)
  Problem: Consults Goal: RH GENERAL PATIENT EDUCATION Description: See Patient Education module for education specifics. Outcome: Progressing   Problem: RH BOWEL ELIMINATION Goal: RH STG MANAGE BOWEL WITH ASSISTANCE Description: STG Manage Bowel with mod I  Assistance. Outcome: Progressing Goal: RH STG MANAGE BOWEL W/MEDICATION W/ASSISTANCE Description: STG Manage Bowel with Medication with mod I Assistance. Outcome: Progressing   Problem: RH BLADDER ELIMINATION Goal: RH STG MANAGE BLADDER WITH ASSISTANCE Description: STG Manage Bladder With toileting / mod I Assistance Outcome: Progressing Goal: RH STG MANAGE BLADDER WITH MEDICATION WITH ASSISTANCE Description: STG Manage Bladder With Medication With mod I Assistance. Outcome: Progressing   Problem: RH SAFETY Goal: RH STG ADHERE TO SAFETY PRECAUTIONS W/ASSISTANCE/DEVICE Description: STG Adhere to Safety Precautions With cues Assistance/Device. Outcome: Progressing   Problem: RH PAIN MANAGEMENT Goal: RH STG PAIN MANAGED AT OR BELOW PT'S PAIN GOAL Description: < 4 with prns Outcome: Progressing   Problem: RH KNOWLEDGE DEFICIT GENERAL Goal: RH STG INCREASE KNOWLEDGE OF SELF CARE AFTER HOSPITALIZATION Description: Patient and wife will be able to manage care at discharge using educational resources for medication, skin care,dietary modification and fall prevention independently Outcome: Progressing   Problem: Education: Goal: Ability to describe self-care measures that may prevent or decrease complications (Diabetes Survival Skills Education) will improve Outcome: Progressing Goal: Individualized Educational Video(s) Outcome: Progressing   Problem: Coping: Goal: Ability to adjust to condition or change in health will improve Outcome: Progressing   Problem: Fluid Volume: Goal: Ability to maintain a balanced intake and output will improve Outcome: Progressing   Problem: Health Behavior/Discharge Planning: Goal: Ability  to identify and utilize available resources and services will improve Outcome: Progressing Goal: Ability to manage health-related needs will improve Outcome: Progressing   Problem: Metabolic: Goal: Ability to maintain appropriate glucose levels will improve Outcome: Progressing   Problem: Nutritional: Goal: Maintenance of adequate nutrition will improve Outcome: Progressing Goal: Progress toward achieving an optimal weight will improve Outcome: Progressing   Problem: Skin Integrity: Goal: Risk for impaired skin integrity will decrease Outcome: Progressing   Problem: Tissue Perfusion: Goal: Adequacy of tissue perfusion will improve Outcome: Progressing

## 2023-11-10 NOTE — Progress Notes (Signed)
 PROGRESS NOTE   Subjective/Complaints:  Pt reports large BM last night- said someone asked him if needed bowel program- explained to pt why he needs it.  Said RLE calm- no issues with salve and tylenol - and spasms are better.  Working actually on B/L LE's-   Slept great.   ROS:   Pt denies SOB, abd pain, CP, N/V/C/D, and vision changes   + Heel disomfort + nausea/vomiting- -hasn't recurred   Objective:   DG Abd 1 View Result Date: 11/08/2023 CLINICAL DATA:  Nausea EXAM: ABDOMEN - 1 VIEW COMPARISON:  None Available. FINDINGS: The bowel gas pattern is normal. No radio-opaque calculi or other significant radiographic abnormality are seen. IMPRESSION: Nonspecific ileus.  No obstruction Electronically Signed   By: Fredrich Jefferson M.D.   On: 11/08/2023 15:11     Recent Labs    11/08/23 0612  WBC 8.1  HGB 10.1*  HCT 31.3*  PLT 235      Recent Labs    11/08/23 0612  NA 139  K 3.8  CL 102  CO2 29  GLUCOSE 146*  BUN 26*  CREATININE 1.33*  CALCIUM  9.3       Intake/Output Summary (Last 24 hours) at 11/10/2023 5366 Last data filed at 11/10/2023 4403 Gross per 24 hour  Intake 600 ml  Output 1525 ml  Net -925 ml     Pressure Injury 10/28/23 Sacrum Medial Stage 1 -  Intact skin with non-blanchable redness of a localized area usually over a bony prominence. STAGE 1 ON SACRUM (Active)  10/28/23 1333  Location: Sacrum  Location Orientation: Medial  Staging: Stage 1 -  Intact skin with non-blanchable redness of a localized area usually over a bony prominence.  Wound Description (Comments): STAGE 1 ON SACRUM  Present on Admission: No     Pressure Injury 10/28/23 Heel Right Deep Tissue Pressure Injury - Purple or maroon localized area of discolored intact skin or blood-filled blister due to damage of underlying soft tissue from pressure and/or shear. DTI on bottom of heel (Active)  10/28/23 1820  Location: Heel   Location Orientation: Right  Staging: Deep Tissue Pressure Injury - Purple or maroon localized area of discolored intact skin or blood-filled blister due to damage of underlying soft tissue from pressure and/or shear.  Wound Description (Comments): DTI on bottom of heel  Present on Admission: No    Physical Exam: Vital Signs Blood pressure 107/60, pulse (!) 110, temperature 98 F (36.7 C), temperature source Oral, resp. rate 17, height 5\' 4"  (1.626 m), weight 60.6 kg, SpO2 97%.      General: awake, alert, appropriate, sitting up eating- finished 75% of tray; NAD HENT: conjugate gaze; oropharynx moist CV: regular rhythm, tachycardic rate; no JVD Pulmonary: CTA B/L; no W/R/R- good air movement GI: soft, NT, ND, (+)BS- normoactive Psychiatric: appropriate - less depressed- more interactive today Neurological: Ox3- asking more complex questions GU- foley in place, clear yellow urine  Neurological: Ox3  MAS of 1+ - so doing better (was 2-3 prior to baclofen ) in RLE- better this AM  Skin: Heels covered with foam dressings B/L- R heel DTI and Sacral Stage I Not wearing prevalon/abd binder/teds this AM  MSK: Ue's 4/5 B/L throughout LE's- 2-/5 B/L-in HF/KE- 4-/5 DF/PF but RLE weaker than LLE   Prior exam Spasticity- MAS of 1+ to 2 in Ue's this Am and 3 in RLE- MAS worse this AM- 2-3 throughout Neurologic: Cranial nerves II through XII intact, motor strength is 4/5 in bilateral  bicep, tricep, grip,and finger ext and hand intrinsics, 3- RIght delt and 4/5 left delt, 3- bilateral  hip flexor, 3- RIght and 4- left knee extensors, 4/5 bilateral ankle dorsiflexor and plantar flexor Sensory exam normal sensation to light touch and proprioception in bilateral upper and lower extremities Tone- 3+ a bilateral biceps triceps and BR, + Hoffman's bilateral 2/5 RIght 3+ left knee Also MAS 3 tone in RIght Knee flexors and extensors, MAS 1 at the left knee Clonus at bilateral ankles   Musculoskeletal: Full range of motion in all 4 extremities. No joint swelling      Assessment/Plan: 1. Functional deficits which require 3+ hours per day of interdisciplinary therapy in a comprehensive inpatient rehab setting. Physiatrist is providing close team supervision and 24 hour management of active medical problems listed below. Physiatrist and rehab team continue to assess barriers to discharge/monitor patient progress toward functional and medical goals  Care Tool:  Bathing              Bathing assist Assist Level: Total Assistance - Patient < 25%     Upper Body Dressing/Undressing Upper body dressing   What is the patient wearing?: Pull over shirt    Upper body assist Assist Level: Moderate Assistance - Patient 50 - 74%    Lower Body Dressing/Undressing Lower body dressing      What is the patient wearing?: Underwear/pull up, Pants     Lower body assist Assist for lower body dressing: Dependent - Patient 0%     Toileting Toileting    Toileting assist Assist for toileting: Dependent - Patient 0%     Transfers Chair/bed transfer  Transfers assist     Chair/bed transfer assist level: Maximal Assistance - Patient 25 - 49%     Locomotion Ambulation   Ambulation assist   Ambulation activity did not occur: Safety/medical concerns  Assist level: 2 helpers Assistive device: [name] Max distance: 10'   Walk 10 feet activity   Assist  Walk 10 feet activity did not occur: Safety/medical concerns  Assist level: 2 helpers Assistive device: Walker-Eva   Walk 50 feet activity   Assist Walk 50 feet with 2 turns activity did not occur: Safety/medical concerns         Walk 150 feet activity   Assist Walk 150 feet activity did not occur: Safety/medical concerns         Walk 10 feet on uneven surface  activity   Assist Walk 10 feet on uneven surfaces activity did not occur: Safety/medical concerns          Wheelchair     Assist Is the patient using a wheelchair?: Yes Type of Wheelchair: Power    Wheelchair assist level: Supervision/Verbal cueing Max wheelchair distance: 200    Wheelchair 50 feet with 2 turns activity    Assist        Assist Level: Supervision/Verbal cueing   Wheelchair 150 feet activity     Assist      Assist Level: Supervision/Verbal cueing   Blood pressure 107/60, pulse (!) 110, temperature 98 F (36.7 C), temperature source Oral, resp. rate 17, height 5\' 4"  (1.626 m), weight 60.6 kg, SpO2 97%.  Medical Problem List and Plan: 1. 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  Antithrombotics: -DVT/anticoagulation:  Mechanical: Sequential compression devices, below knee Bilateral lower extremities 4/23- will see if Dr Ellery Guthrie will allow us  to start Lovenox - he said yes, so will start. 30mg  daily. 5/14- when leaves, will go on Eliquis for a total of 3 months 2.5 mg BID             -antiplatelet therapy: N/A 3. Pain Management: tylenol  prn. Voltaren  QID, lyrica  50mg  BID, flexeril  PRN -10/24/23 leg muscle soreness likely from PT/OT this week; will start muscle rub cream 4/28- still c/o muscle soreness in legs- con't pain regimen as needed 4/30- still sore, but don't want Flexeril  since likely caused confusion- con't tylenol  prn- can use muscle rub which is ordered 5/1- denies a lot of pain- mainly sore form spasticity/working out 5/5- pain yesterday- not today-  5/7- will add Tramadol  50 mg q8 hours prn if pain won't go away otherwise-  due to renal function- doing TID prn- hopefully baclofen  helps pain in RLE as well 5/8- pain better since started  baclofen - didn't need tramadol  5/9- will add Dantrolene 50 mg at bedtime and see if tolerates- if so, will con't to increase the dose to help spasticity and reduce chance of sedation/side effects  -11/07/23 very delirious yesterday per pt, will stop baclofen  and leave Dantrolene for now; monitor -5/12 continue current regimen, mentation sounds like it is better today 5/13- Dantrolene not making confused so far- will increase after 5 days- added baclofen  and Flexeril  to allergy list 5/14- Doing better with tylenol  and muscle rub 4. Mood/Behavior/Sleep: LCSW to follow up for evaluation and support when appropriate --Delirium precautions--His mental status has cleared but will monitor for worsening with transfer to new unit .pt has not required prn meds for agitation in several days  Monitor sleep wake cycle             -antipsychotic agents: Change haldol  q 6 hrs prn to Seroquel  12.5mg  tid prn  -Restoril  7.5mg  nightly, cymbalta  20mg  daily 4/23-4/25 no delirium so far 4/28- having some delirium this AM? Not severe, but will monitor since has had in recent past 4/29- still confused this AM- but per nursing , was less confused overnight after Restoril  stopped- still pleasantly confused this AM when first woke up- didn't open eyes.  5/9 Aox3 this AM -11/07/23 delirious yesterday, stopped baclofen  as above. 5/14- resolved  5. Neuropsych/cognition: This patient is usually  capable of making decisions on his own behalf. 6. Skin/Wound Care: Routine pressure relief measures. 7. Fluids/Electrolytes/Nutrition:  Monitor I/O. Routine labs. Continue vitamins/supplements. 8. 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 Willye Harvey, per note, leave in place for 7-10d, Consider resuming In-N-Out catheterization for management of  his neurogenic bladder at that time. Recommend using a coud catheter for intermittent catheterization (when resumed).  4/28- will leave foley for 7-10 days 10/31/23 foley likely ready to come out tomorrow? Placed 4/25 5/5- will d/c foley in AM after speaking with pt 5/6- pt wants ot do what wife wants- cannot reach wife to discuss foley removal- pt doesn't want to do cathing and sounds like would rather keep foley- but will discuss with him further 5/7- pt and wife have decided they don't want to remove foley - they don't want ot go back to in/out caths-  since wasn't emptying before- they want a chronic foley- when offered to remove foley just for rehab, to see if he gets return, he is scared will get bleeding again, due to false passage already had and declined 5/12 Foley draining clear yellow urine 10.  Diabetes- A1c 8.0-  with peripheral neuropathy by hx only on Jardiance at home, use SSI in the hospital  -10/23/23 CBGs variable but likely d/t steroids; cont SSI for now 4/29- CBG's very labile in last 24 hours 99-383- - it concerns me to start Jardiance since of Foley and risk of UTI's- also for starting Tradjenta  since BG was 99 last night- don't want to drop BG too much 4/30- Will add Tradjenta  since BG's still running 150-242- will monitor closely for CBG hypoglycemia-   10/30/23 CBGs looking great!  -10/31/23 CBGs a little higher the last day, but monitor for trend  5/5- Cbgs all less than 200- con't to monitor  5/6- CBGs 130s- 180s- 5/7-5/9 CBG's 128 to 220- had snack yesterday- still snacking in afternoon -5/10-11/25 CBGs great except for afternoon d/t snacks. Leave meds alone for now -5/12 fair glucose control, continue current regimen and monitor. 5/13- 5/14great control except for afternoon- snacking- will d/w pt  CBG (last 3)  Recent Labs     11/09/23 1640 11/09/23 2041 11/10/23 0537  GLUCAP 126* 258* 93     11.  Spasticity affecting LEs> UE and R>L , aggressive ROM  with PT, consider oral meds  4/22- starting baclofen  5 mg TID- with his Cr 1.50- has been running 1.68 to 2.30  4/24- will not increase baclofen  due to Renal issues 5/9- will add Dantrolene 50 mg at bedtime to reduce chance of spasticity but also reduce chance of side effects/sedation- - will monitor -11/07/23 stopped baclofen  d/t delirium, cont dantrolene -5/12 continue dantrolene, continue hold baclofen  5/13- will increase Dantrolene after 5 days 5/14- increase Dantrolene to 50 mg BID 12.  PAD s/p SFA and Iliac stenting, continue metoprolol  50mg  daily  13. CKD3b? With AKI -4/22- Pt's Cr down to 1.50 from 2.29 2 days ago and 1.68 yesterday- will monitor 2x/week to see if will reduce further or be labile.   4/24- Cr 1.84- and BUN stable at 55- is drinking per pt  4/28- Cr 1.63- doing better 4/29- Cr 1.59 after 12+ hours of IVF's- and BUN very slightly down to 70 from 72-   5/1- Cr down to 1.43 and BUN down to 53  5/5- Cr 1.51 and BUN down to 37- drinking more!  5/8- Cr 1.8- up from 1.51- but BUN 34 down from 37- will push fluids  5/12 creatinine and BUN much lower at 1.33/26-improved 14. Questionable neurogenic bowel: continue miralax  32g daily, senokot s 2 tabs qAM  5/1- LBM this Am- was accident- might need bowel program 5/2- Placed on suppository nightly with possible dig stim- had multiple BM's during/after bowel program- if continues  like this, will need dig stim.   -10/31/23 LBM last night x2 5/5- LBM last night with bowel program 5/6- BM last evening- no bowel note for last few nights seen 5/7- LBM 2 nights ago- no bowel note- d/w nursing coordinator 11/07/23 LBM last night 5/12 LBM night before last.  Patient reports he was not given his suppository last night-appears documented. KUB and LFTS ordered 5/13- LFTs look good- having nightly BM's with bowel  program- need to teach wife to do 5/14- pt going to SNF- wife doesn't want to learn bowel program 15. HLD: crestor  20mg  nightly 16. Delirium/Confusion 4/29- yesterday given IVF's for this- checked U/A and Cx- which showed mod leuks, however rate bacteria and very little WBC's 6-10- which is not a UTI in most cases- will d/w pharmacy-  -pt still pleasantly confused this AM- but didn't even open eyes, was so sleepy, so hard to tease out what is due to sleepiness and what is actual delirium- per nurse, much better last night-  4/30- pt Ox3 this AM- very with it- likely flexeril  based on chart review- given for pain since on no pain meds- will keep off Flexeril  and monitor Sx's  5/5- Sx's resolved  -11/07/23 SEE ABOVE 17. Hx of HTN with severe hypotension 4/30- pt's BP dropped to 79 systolic yesterday when sitting with OT- not clear if had TEDS/ACE wraps- will reduce Metoprolol  XL to 12.5 mg daily- from 50 mg daily and see if that helps- don't want ot add midodrine  quite yet.  5/1- Adding midodrine  5 mg TID and adding abd binder- couldn't tolerate ACE wraps at all- just TED's-   5/2- BP yesterday was less symptomatic- but still dropped some- con't regimen -5/3-4/25 BPs a bit better recently; monitor 5/6-5/11 BP doing better- con't regimen 5/12 orthostatic hypotension noted this morning, bolus IVF 500cc. 5/13- Will increase Midodrine  to 7.5 mg TID 5/14- BP slightly better Vitals:   11/06/23 1953 11/07/23 0344 11/07/23 1950 11/08/23 0553  BP: (!) 178/67 126/81 (!) 125/57 (!) 109/59   11/08/23 1550 11/08/23 1922 11/09/23 0436 11/09/23 0900  BP: (!) 114/59 (!) 111/49 (!) 100/55 (!) 116/56   11/09/23 1555 11/09/23 1959 11/10/23 0520 11/10/23 0745  BP: (!) 113/56 (!) 111/56 (!) 120/52 107/60     18. Atelectasis 4/30- sounds coarse- asked pt to use ICS regularly- is at bedside- also described "spitting things up"_ had "spit cup"- at bedside-  had barely anything in it. 5/5- not spitting up anymore   19. R heel DTI and Stage I on sacrum 5/2- is new- ordered prevalon boots as well as low air loss mattress and needs to be turned q2 hours- placed order  5/8- pt not able to tolerate air mattress- turn q2 hours and prevalons for Heel DTI- d/w nursing- just removed this AM 5/12 patient asked for device to keep pressure off his heel, Prevalon boots at bedside. Will chek with nursing regarding use of these boots at night.  20. Spasticity  5/5- will d/w wife tomorrow about restarting Baclofen  vs Dantrolene.   5/6- attempted to call wife again x2- cannot get her 5/7- was able to see wife yesterday and discussed- we will retry Pt on Baclofen  5 mg TID for now- not a higher dose due to renal issues-  and monitor for confusion/delirium- if gets confused, will try Dantrolene.   5/8- no confusion and RLE much less pain- so think it's working 5/9- will add Dantrolene as above 5/14- Increased dantrolene to 50 mg BID 21.  Severe malnutrition  5/5- per Dietitian- will do supplements 22. Mild tachycardia 5/8- per pt, has ALWAYS had tachycardia- has been worked up- and nothing found- usually 90s-100's per pt- which is the same here- will monitor 23. Ileus???  5/13- reached out to Radiology about pt's questionable ileus- not clear in Radiology report  5/14- doesn't have ileus- but full of gas- started Simethicone  QID  I spent a total of 37   minutes on total care today- >50% coordination of care- due to  D/w pt about plan for d/c- and plan for f/u- also spasticity and pain- also d/w nursing  LOS: 23 days A FACE TO FACE EVALUATION WAS PERFORMED  Joandry Slagter 11/10/2023, 8:19 AM

## 2023-11-10 NOTE — Patient Care Conference (Signed)
 Inpatient RehabilitationTeam Conference and Plan of Care Update Date: 11/09/2023   Time: 1132 am    Patient Name: Christian Bailey      Medical Record Number: [id number]  Date of Birth: 05/12/1941 Sex: Male         Room/Bed: 4M03C/4M03C-01 Payor Info: Payor: MEDICARE / Plan: MEDICARE PART A AND B / Product Type: *No Product type* /    Admit Date/Time:  10/18/2023  3:33 PM  Primary Diagnosis:  Acute incomplete quadriplegia [facility])  Hospital Problems: Principal Problem:   Acute incomplete quadriplegia (HCC) Active Problems:   Cervical myelopathy (HCC)   Urinary retention   Neurogenic bladder   Transient iatrogenic urethral bleeding   Depression with anxiety   Adjustment disorder with mixed anxiety and depressed mood   Protein-calorie malnutrition, severe    Expected Discharge Date: Expected Discharge Date:  (changed to SNF)  Team Members Present: Physician leading conference: Dr. Celia Coles Social Worker Present: Adrianna Albee, LCSW Nurse Present: Jerene Monks, RN PT Present: Aundria Leech, PT OT Present: Nila Barth, OT     Current Status/Progress Goal Weekly Team Focus  Bowel/Bladder   foley in place; bowel program LBM: 5/12   gain regular bowel pattern   assist with toileting needs prn    Swallow/Nutrition/ Hydration               ADL's   total A overall for ADLs, SBA static balance seated EOB, Max A-total+1 transfers   Mod A        Mobility   max to total A for bed mobility and slideboard transfers, +2 for standing or use of lift equipment, supervision PWC mobility, limited by hypotension this week   min/mod A w/c level transfers and superivision PWC mobility  tone management, BP management, postural control    Communication                Safety/Cognition/ Behavioral Observations               Pain   no c/o pain   remain pain free   assess pain QS and prn    Skin   RUE skin tear-foam in place; DTI R heel-foam in place   remain free of  new skin breakdown/infection  assess skin QS and prn      Discharge Planning:  Wife came in  for therapies and decided hsband is too much care for her at this time. Want to pursue SNF[facility] at this time    Team Discussion: Patient was admitted post C5-7 ACDF secondary to Traumatic incomplete quadriplegia. Patient has low blood pressure: medications adjusted by MD.  Patient limited by pain , spasticity, weakness  of bilateral shoulder and poor balance of bilateral lower extremities.   Patient on target to meet rehab goals: no, Patient requires total A overall with ADLs. Patient requires max assistance for transfers using stedy or slide board due to spasticity/tone. Goals for discharge are set for mod A wheelchair level. Patient is not progressing with goals.   *See Care Plan and progress notes for long and short-term goals.   Revisions to Treatment Plan:  Wound plan initiated  Prevalon boots on bilateral heels Foam dressings on both heels Family Conference Downgraded goals Slide board Stedy Foley catheter\  Teaching Needs: Safety, medications, transfers, toileting, foley care, bowel program, etc   Current Barriers to Discharge: Decreased caregiver support, Neurogenic bowel and bladder, and Wound care  Possible Resolutions to Barriers: Family Education SNF     Medical  Summary Current Status: Severe OH- neurogenic bowel and bladder-sacrum looking better- DTI smaller- wife cannot take him home  Barriers to Discharge: Behavior/Mood;Complicated Wound;Medical stability;Hypotension;Spasticity;Self-care education;Weight bearing restrictions;Inadequate Nutritional Intake;Renal Insufficiency/Failure  Barriers to Discharge Comments: mood- depression- having to go to SNF; no significant change in function except core stronger/sitting balance; RLE pain/severe spasticity Possible Resolutions to [employer] Focus: started family education- didn't go well- wife cannot take home- sent  out FL2   Continued Need for Acute Rehabilitation Level of Care: The patient requires daily medical management by a physician with specialized training in physical medicine and rehabilitation for the following reasons: Direction of a multidisciplinary physical rehabilitation program to maximize functional independence : Yes Medical management of patient stability for increased activity during participation in an intensive rehabilitation regime.: Yes Analysis of laboratory values and/or radiology reports with any subsequent need for medication adjustment and/or medical intervention. : Yes   I attest that I was present, lead the team conference, and concur with the assessment and plan of the team.   Thirza Pellicano Gayo 11/09/2023, 1132 am

## 2023-11-11 DIAGNOSIS — G959 Disease of spinal cord, unspecified: Secondary | ICD-10-CM | POA: Diagnosis not present

## 2023-11-11 DIAGNOSIS — F54 Psychological and behavioral factors associated with disorders or diseases classified elsewhere: Secondary | ICD-10-CM

## 2023-11-11 LAB — BASIC METABOLIC PANEL WITH GFR
Anion gap: 9 (ref 5–15)
BUN: 30 mg/dL — ABNORMAL HIGH (ref 8–23)
CO2: 28 mmol/L (ref 22–32)
Calcium: 8.5 mg/dL — ABNORMAL LOW (ref 8.9–10.3)
Chloride: 100 mmol/L (ref 98–111)
Creatinine, Ser: 1.68 mg/dL — ABNORMAL HIGH (ref 0.61–1.24)
GFR, Estimated: 40 mL/min — ABNORMAL LOW (ref >60)
Glucose, Bld: 154 mg/dL — ABNORMAL HIGH (ref 70–99)
Potassium: 4 mmol/L (ref 3.5–5.1)
Sodium: 137 mmol/L (ref 135–145)

## 2023-11-11 LAB — GLUCOSE, CAPILLARY
Glucose-Capillary: 134 mg/dL — ABNORMAL HIGH (ref 70–99)
Glucose-Capillary: 208 mg/dL — ABNORMAL HIGH (ref 70–99)
Glucose-Capillary: 237 mg/dL — ABNORMAL HIGH (ref 70–99)
Glucose-Capillary: 210 mg/dL — ABNORMAL HIGH (ref 70–99)

## 2023-11-11 MED ORDER — FLUTICASONE PROPIONATE 50 MCG/ACT NA SUSP
2.0000 | Freq: Every day | NASAL | Status: DC
  Administered 2023-11-11: 2 via NASAL
  Filled 2023-11-11: qty 16

## 2023-11-11 NOTE — Consult Note (Signed)
 Neuropsychological Consultation Comprehensive Inpatient Rehab   Patient:   Christian Bailey   DOB:   1941-03-16  MR Number:  [id number]  Location:  [facility] [address] CENTER [address] [place] [address] Dept: [phone] Loc: [phone]           Date of Service:   11/10/2023  Start Time:   1 PM End Time:   2 PM  Provider/Observer:  Chapman Commodore, Psy.D.       Clinical Neuropsychologist       Billing Code/Service: [phone]  Reason for Service:    Christian Bailey is an 83 year old male referred for neuropsychological consultation due to adjustment issues including past and present history of anxiety and depression who is currently admitted to the comprehensive inpatient rehabilitation unit recovering from resulting complications from cervical spine injury.  The patient has a past medical history including diabetes, severe PAD and multiple vascular procedures, macular degeneration/retinal detachment, GIB, polyneuropathy and recurrent falls.  Patient also has a long history of anxiety and depression.  Patient was recently admitted after a fall where he struck his head against a wall and had complaints of neck and back pain afterwards.  Patient suffered acute ligamentous injury C2-C5, diffuse edema, compression of spine at C5/6 and C6/7 due to degenerative disc disease and ossification of posterior longitudinal ligament.  Patient had noted that he had had an onset of weakness 6 months prior to fall.  Patient also noted that he had been having increasing difficulties emptying his bladder.  Patient evaluated by neurosurgery and had decompressive surgery C2-C5 by Elna Haggis, MD.  Patient did experience some postoperative delirium with psychiatric consult on 4/14 with medication adjustments.  The symptoms have resolved.  I had spent some time with the patient in April during his admission but the patient was too somnolent to get a great deal  of information.  Patient has been improving but at this point there is an expectation that he will be discharged to skilled nursing facility.  Patient has great concerns that he would be a burden to his wife due to his incomplete paraplegia.  While the patient is making some gains in therapy continues to be limited in his functional capacity.  Palliative care has been called in and consulted for goals of care and there is a limited DNR status.  During today's clinical visit, the patient was awake and alert.  He acknowledged anxiety and depression and we reviewed issues related to his previously stated concerns around being a burden towards his wife due to his functional deficits.  Patient reports that he understands the need for skilled nursing placement but also acknowledges the gains that he has made in the increased capacities that he is developing.  Patient denied depression to the point that was motivating him to not work on any gains and he has been a participant in therapeutic interventions.  Patient denies any suicidal ideation.  Patient is anxious about his loss of function and notes that his anxiety was exacerbated by his resulting deficits.  He acknowledged not addressing some of these developing deficits prior to his most recent fall that resulted in hospitalization.  We worked on coping and adjustment issues and working through concerns and emotional response to expected skilled nursing facility transfer.  HPI for the current admission:    HPI: Christian Bailey is an 83 year old male with history fo T2DM, severe PAD s/p multiple vascular procedures, macular degeneration/retinal detachment,  GIB, polyneuropathy, recurrent falls X anxiety/depression who was admitted with after a fall where he struck his head against the wall and had complaints of neck and back pain. He was found to have edematous changes due to acute ligamentous injury C2-C5, diffuse edema posterior cervical musculature, compression of  spine at C5/6 and C6/7 due to DDD and ossification of posterior longitudinal ligament,   The patient states he has had onset of weakness around 6 months ago.  He has also noticed difficulty emptying his bladder.  His primary care physician saw him and started him on Myrbetriq .  He has not seen a urologist.  He has a history of diabetes with painful neuropathy and was managed with Jardiance at home.  He has required insulin  since hospitalization.  The patient states his wife has been assisting with showering and bathing prior to admission.  He was walking with a walker prior to admission. He states that he has had difficulty moving the right shoulder since a reverse shoulder arthroplasty done several years ago by Dr. Brunilda Capra. The patient experienced postoperative delirium and in fact had psychiatry consult on 10/11/2023.  They recommended change from Seroquel  to Haldol .  The patient had resolution of the symptoms and has not been using Haldol  for several days. Palliative care was consulted for goals of care.  She was prior to surgery.  Limited DNR status.  Palliative signed off  Medical History:   Past Medical History:  Diagnosis Date   Anemia    Complication of anesthesia    Diabetes mellitus (HCC)    TYPE 2   GERD (gastroesophageal reflux disease)    History of blood transfusion    GI bleed   History of colon polyps    History of hiatal hernia    Hyperlipemia    Hypertension    Osteoarthritis    PAD (peripheral artery disease) (HCC)    a. stenting of his left common iliac artery >20 years ago. b. h/o LEIA stent and 2 stents to R SFA in 2011. c. 04/2014:  s/p PTA of right SFA for in-stent restenosis, occluded left SFA   PONV (postoperative nausea and vomiting)    no porblem with the last 3 surgeries   RBBB (right bundle branch block with left anterior fascicular block)    NUCLEAR STRESS TEST, 08/18/2010 - no significant wall motion abnoramlities noted, post-stress EF 69%, normal myocardial  perfusion study   Sinus tachycardia    a. Noted during admission 04/2014 but upon review seems to be frequent finding for patient.   Stenosis of carotid artery    a. 50% right carotid stenosis by angiogram 04/2014.         Patient Active Problem List   Diagnosis Date Noted   Coping style affecting medical condition 11/11/2023   Protein-calorie malnutrition, severe 11/01/2023   Adjustment disorder with mixed anxiety and depressed mood 10/28/2023   Depression with anxiety 10/25/2023   Urinary retention 10/22/2023   Neurogenic bladder 10/22/2023   Transient iatrogenic urethral bleeding 10/22/2023   Acute incomplete quadriplegia (HCC) 10/19/2023   Spinal cord compression (HCC) 10/18/2023   Unsteady gait 10/18/2023   Neck pain 10/18/2023   Cervical myelopathy (HCC) 10/18/2023   Delirium due to another medical condition, acute, hyperactive 10/09/2023   Intractable back pain 10/03/2023   Fall 10/02/2023   Acute renal failure superimposed on stage 3b chronic kidney disease, unspecified acute renal failure type (HCC) 05/07/2023   Leukocytosis 05/07/2023   Exudative age-related macular degeneration of left eye  with inactive choroidal neovascularization (HCC) 03/25/2020   Exudative age-related macular degeneration of right eye with inactive choroidal neovascularization (HCC) 03/25/2020   Advanced nonexudative age-related macular degeneration of right eye with subfoveal involvement 03/25/2020   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 involvement 03/25/2020   Bacteremia due to Gram-negative bacteria 01/19/2019   CKD (chronic kidney disease) stage 3, GFR 30-59 ml/min (HCC) 01/19/2019   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) 07/06/2018   S/P shoulder replacement, right 12/07/2016   PVD (peripheral vascular disease) (HCC) 09/18/2016   Sinus tachycardia 05/15/2014   Carotid artery disease (HCC) 04/26/2014   PAD (peripheral  artery disease) (HCC) 06/12/2013   RBBB (right bundle branch block with left anterior fascicular block) 06/12/2013   Claudication (HCC) 12/14/2012   Essential hypertension 12/14/2012   Hyperlipidemia 12/14/2012   Type 2 diabetes mellitus (HCC) 12/14/2012   History of colonic polyps 01/25/2012   DM 08/11/2010   Acute duodenal ulcer with hemorrhage 08/11/2010    Behavioral Observation/Mental Status:   Christian Bailey  presents as a 83 y.o.-year-old Right handed Caucasian Male who appeared his stated age. his dress was Appropriate and he was Well Groomed and his manners were Appropriate to the situation.  his participation was indicative of Appropriate behaviors.  There were physical disabilities noted.  he displayed an appropriate level of cooperation and motivation.    Interactions:    Active Appropriate  Attention:   abnormal and attention span appeared shorter than expected for age  Memory:   within normal limits; recent and remote memory intact  Visuo-spatial:   not examined  Speech (Volume):  low  Speech:   normal; normal  Thought Process:  Coherent and Relevant  Coherent  Though Content:  WNL; not suicidal and not homicidal  Orientation:   person, place, time/date, and situation  Judgment:   Fair  Planning:   Fair  Affect:    Anxious  Mood:    Dysphoric  Insight:   Good  Intelligence:   normal  Psychiatric History:  Patient with past history of anxiety and depression and continues with his home medicines of Cymbalta .  Family Med/Psych History:  Family History  Problem Relation Age of Onset   Heart disease Mother    Leukemia Mother    Stomach cancer Father    Esophageal cancer Brother    Liver disease Brother    Alcoholism Brother    Leukemia Brother    Leukemia Brother    Diabetes type II Brother    Lung disease Sister     Risk of Suicide/Violence: virtually non-existent patient denies any suicidal or homicidal ideation and does not appear to be  motivated to do self-harm or give up on therapeutic efforts for functional gains.  Impression/DX:   Mattthew Bailey is an 83 year old male referred for neuropsychological consultation due to adjustment issues including past and present history of anxiety and depression who is currently admitted to the comprehensive inpatient rehabilitation unit recovering from resulting complications from cervical spine injury.  The patient has a past medical history including diabetes, severe PAD and multiple vascular procedures, macular degeneration/retinal detachment, GIB, polyneuropathy and recurrent falls.  Patient also has a long history of anxiety and depression.  Patient was recently admitted after a fall where he struck his head against a wall and had complaints of neck and back pain afterwards.  Patient suffered acute ligamentous injury C2-C5, diffuse edema, compression of spine at C5/6 and C6/7 due to degenerative disc  disease and ossification of posterior longitudinal ligament.  Patient had noted that he had had an onset of weakness 6 months prior to fall.  Patient also noted that he had been having increasing difficulties emptying his bladder.  Patient evaluated by neurosurgery and had decompressive surgery C2-C5 by Elna Haggis, MD.  Patient did experience some postoperative delirium with psychiatric consult on 4/14 with medication adjustments.  The symptoms have resolved.  I had spent some time with the patient in April during his admission but the patient was too somnolent to get a great deal of information.  Patient has been improving but at this point there is an expectation that he will be discharged to skilled nursing facility.  Patient has great concerns that he would be a burden to his wife due to his incomplete paraplegia.  While the patient is making some gains in therapy continues to be limited in his functional capacity.  Palliative care has been called in and consulted for goals of care and there is a  limited DNR status.  During today's clinical visit, the patient was awake and alert.  He acknowledged anxiety and depression and we reviewed issues related to his previously stated concerns around being a burden towards his wife due to his functional deficits.  Patient reports that he understands the need for skilled nursing placement but also acknowledges the gains that he has made in the increased capacities that he is developing.  Patient denied depression to the point that was motivating him to not work on any gains and he has been a participant in therapeutic interventions.  Patient denies any suicidal ideation.  Patient is anxious about his loss of function and notes that his anxiety was exacerbated by his resulting deficits.  He acknowledged not addressing some of these developing deficits prior to his most recent fall that resulted in hospitalization.  We worked on coping and adjustment issues and working through concerns and emotional response to expected skilled nursing facility transfer.  Diagnosis:    Depression with anxiety         Electronically Signed   _______________________ Chapman Commodore, Psy.D. Clinical Neuropsychologist

## 2023-11-11 NOTE — Progress Notes (Signed)
 PROGRESS NOTE   Subjective/Complaints:  Pt reports doing pretty well Nose blocked up- also has a little cough, but feels it's due to nasal discharge not from lungs per se.   Had good BM last night with bowel program- no dig stim done.   Asked to have table put back multiple times per day- says not done every time.     ROS:   Pt denies SOB, abd pain, CP, N/V/C/D, and vision changes    + Heel discomfort- on R side- less so + nausea/vomiting- -hasn't recurred   Objective:   No results found.    No results for input(s): "WBC", "HGB", "HCT", "PLT" in the last 72 hours.     Recent Labs    11/11/23 0541  NA 137  K 4.0  CL 100  CO2 28  GLUCOSE 154*  BUN 30*  CREATININE 1.68*  CALCIUM  8.5*       Intake/Output Summary (Last 24 hours) at 11/11/2023 0837 Last data filed at 11/11/2023 1610 Gross per 24 hour  Intake 490 ml  Output 625 ml  Net -135 ml     Pressure Injury 10/28/23 Sacrum Medial Stage 1 -  Intact skin with non-blanchable redness of a localized area usually over a bony prominence. STAGE 1 ON SACRUM (Active)  10/28/23 1333  Location: Sacrum  Location Orientation: Medial  Staging: Stage 1 -  Intact skin with non-blanchable redness of a localized area usually over a bony prominence.  Wound Description (Comments): STAGE 1 ON SACRUM  Present on Admission: No     Pressure Injury 10/28/23 Heel Right Deep Tissue Pressure Injury - Purple or maroon localized area of discolored intact skin or blood-filled blister due to damage of underlying soft tissue from pressure and/or shear. DTI on bottom of heel (Active)  10/28/23 1820  Location: Heel  Location Orientation: Right  Staging: Deep Tissue Pressure Injury - Purple or maroon localized area of discolored intact skin or blood-filled blister due to damage of underlying soft tissue from pressure and/or shear.  Wound Description (Comments): DTI on bottom of  heel  Present on Admission: No    Physical Exam: Vital Signs Blood pressure (!) 155/69, pulse (!) 111, temperature 98 F (36.7 C), resp. rate 18, height 5\' 4"  (1.626 m), weight 60.6 kg, SpO2 100%.       General: awake, alert, appropriate, sitting up in bed; NAD HENT: conjugate gaze; oropharynx moist CV: regular rhythm- mildly tachycardic; no JVD Pulmonary: CTA B/L; no W/R/R- good air movement GI: soft, NT, ND, (+)BS Psychiatric: appropriate- less cheerful Neurological: Ox3 GU- trace light yellow amber urine- has been emptied  Skin: Heels covered with foam dressings B/L- R heel DTI and Sacral Stage I Wearing prevalons B/L MSK: Ue's 4/5 B/L throughout LE's- 2-/5 B/L-in HF/KE- 4-/5 DF/PF but RLE weaker than LLE   Prior exam Spasticity- MAS of 1+ to 2 in Ue's this Am and 3 in RLE- MAS worse this AM- 2-3 throughout Neurologic: Cranial nerves II through XII intact, motor strength is 4/5 in bilateral  bicep, tricep, grip,and finger ext and hand intrinsics, 3- RIght delt and 4/5 left delt, 3- bilateral  hip flexor, 3- RIght and 4-  left knee extensors, 4/5 bilateral ankle dorsiflexor and plantar flexor Sensory exam normal sensation to light touch and proprioception in bilateral upper and lower extremities Tone- 3+ a bilateral biceps triceps and BR, + Hoffman's bilateral 2/5 RIght 3+ left knee Also MAS 3 tone in RIght Knee flexors and extensors, MAS 1 at the left knee Clonus at bilateral ankles  Musculoskeletal: Full range of motion in all 4 extremities. No joint swelling      Assessment/Plan: 1. Functional deficits which require 3+ hours per day of interdisciplinary therapy in a comprehensive inpatient rehab setting. Physiatrist is providing close team supervision and 24 hour management of active medical problems listed below. Physiatrist and rehab team continue to assess barriers to discharge/monitor patient progress toward functional and medical goals  Care Tool:  Bathing               Bathing assist Assist Level: Total Assistance - Patient < 25%     Upper Body Dressing/Undressing Upper body dressing   What is the patient wearing?: Pull over shirt    Upper body assist Assist Level: Moderate Assistance - Patient 50 - 74%    Lower Body Dressing/Undressing Lower body dressing      What is the patient wearing?: Underwear/pull up, Pants     Lower body assist Assist for lower body dressing: Dependent - Patient 0%     Toileting Toileting    Toileting assist Assist for toileting: Dependent - Patient 0%     Transfers Chair/bed transfer  Transfers assist     Chair/bed transfer assist level: Maximal Assistance - Patient 25 - 49%     Locomotion Ambulation   Ambulation assist   Ambulation activity did not occur: Safety/medical concerns  Assist level: 2 helpers Assistive device: [name] Max distance: 10'   Walk 10 feet activity   Assist  Walk 10 feet activity did not occur: Safety/medical concerns  Assist level: 2 helpers Assistive device: Walker-Eva   Walk 50 feet activity   Assist Walk 50 feet with 2 turns activity did not occur: Safety/medical concerns         Walk 150 feet activity   Assist Walk 150 feet activity did not occur: Safety/medical concerns         Walk 10 feet on uneven surface  activity   Assist Walk 10 feet on uneven surfaces activity did not occur: Safety/medical concerns         Wheelchair     Assist Is the patient using a wheelchair?: Yes Type of Wheelchair: Power    Wheelchair assist level: Supervision/Verbal cueing Max wheelchair distance: 200    Wheelchair 50 feet with 2 turns activity    Assist        Assist Level: Supervision/Verbal cueing   Wheelchair 150 feet activity     Assist      Assist Level: Supervision/Verbal cueing   Blood pressure (!) 155/69, pulse (!) 111, temperature 98 F (36.7 C), resp. rate 18, height 5\' 4"  (1.626 m), weight 60.6  kg, SpO2 100%.  Medical Problem List and Plan: 1. 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  Antithrombotics: -DVT/anticoagulation:  Mechanical: Sequential compression devices, below knee Bilateral lower extremities 4/23- will see if Dr Ellery Guthrie will allow us  to start Lovenox - he said yes, so will start. 30mg  daily. 5/14- when leaves, will go on Eliquis for a total of 3 months 2.5 mg BID             -antiplatelet therapy: N/A 3. Pain Management: tylenol  prn. Voltaren  QID, lyrica  50mg  BID, flexeril  PRN -10/24/23 leg muscle soreness likely from PT/OT this week; will start muscle rub cream 4/28- still c/o muscle soreness in legs- con't pain regimen as needed 4/30- still sore, but don't want Flexeril  since likely caused confusion- con't tylenol  prn- can use muscle rub which is ordered 5/1- denies a lot of pain- mainly sore form spasticity/working out 5/5- pain yesterday- not today-  5/7- will add Tramadol  50 mg q8 hours prn if pain won't go away otherwise-  due to renal function- doing TID prn- hopefully baclofen  helps pain in RLE as well 5/8- pain better since started baclofen - didn't need tramadol  5/9- will add Dantrolene 50 mg at bedtime and see if tolerates- if so, will con't to increase the dose to help spasticity and reduce chance of sedation/side effects  -11/07/23 very delirious yesterday per pt, will stop baclofen  and leave Dantrolene for now; monitor -5/12 continue current regimen, mentation sounds like it is better today 5/13- Dantrolene not making confused so far- will increase after 5 days- added baclofen  and Flexeril  to allergy list 5/14-  Doing better with tylenol  and muscle rub 4. Mood/Behavior/Sleep: LCSW to follow up for evaluation and support when appropriate --Delirium precautions--His mental status has cleared but will monitor for worsening with transfer to new unit .pt has not required prn meds for agitation in several days  Monitor sleep wake cycle             -antipsychotic agents: Change haldol  q 6 hrs prn to Seroquel  12.5mg  tid prn  -Restoril  7.5mg  nightly, cymbalta  20mg  daily 4/23-4/25 no delirium so far 4/28- having some delirium this AM? Not severe, but will monitor since has had in recent past 4/29- still confused this AM- but per nursing , was less confused overnight after Restoril  stopped- still pleasantly confused this AM when first woke up- didn't open eyes.  5/9 Aox3 this AM -11/07/23 delirious yesterday, stopped baclofen  as above. 5/14- resolved  5. Neuropsych/cognition: This patient is usually  capable of making decisions on his own behalf. 6. Skin/Wound Care: Routine pressure relief measures. 7. Fluids/Electrolytes/Nutrition:  Monitor I/O. Routine labs. Continue vitamins/supplements. 8. 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 Willye Harvey, per note, leave in place for 7-10d, Consider resuming In-N-Out catheterization for management of his neurogenic bladder at that time. Recommend using a coud catheter for intermittent catheterization (when resumed).  4/28- will leave foley for 7-10 days 10/31/23 foley likely ready to come out tomorrow? Placed 4/25 5/5- will d/c foley in AM after speaking with pt 5/6- pt wants ot do what wife wants- cannot reach wife to discuss foley removal- pt doesn't want  to do cathing and sounds like would rather keep foley- but will  discuss with him further 5/7- pt and wife have decided they don't want to remove foley - they don't want ot go back to in/out caths-  since wasn't emptying before- they want a chronic foley- when offered to remove foley just for rehab, to see if he gets return, he is scared will get bleeding again, due to false passage already had and declined 5/12 Foley draining clear yellow urine 10.  Diabetes- A1c 8.0-  with peripheral neuropathy by hx only on Jardiance at home, use SSI in the hospital  -10/23/23 CBGs variable but likely d/t steroids; cont SSI for now 4/29- CBG's very labile in last 24 hours 99-383- - it concerns me to start Jardiance since of Foley and risk of UTI's- also for starting Tradjenta  since BG was 99 last night- don't want to drop BG too much 4/30- Will add Tradjenta  since BG's still running 150-242- will monitor closely for CBG hypoglycemia-   10/30/23 CBGs looking great!  -10/31/23 CBGs a little higher the last day, but monitor for trend  5/5- Cbgs all less than 200- con't to monitor  5/6- CBGs 130s- 180s- 5/7-5/9 CBG's 128 to 220- had snack yesterday- still snacking in afternoon -5/10-11/25 CBGs great except for afternoon d/t snacks. Leave meds alone for now -5/12 fair glucose control, continue current regimen and monitor. 5/13- 5/15- great control except for afternoon- snacking- will d/w pt  CBG (last 3)  Recent Labs    11/10/23 1556 11/10/23 2047 11/11/23 0618  GLUCAP 92 222* 134*     11.  Spasticity affecting LEs> UE and R>L , aggressive ROM  with PT, consider oral meds  4/22- starting baclofen  5 mg TID- with his Cr 1.50- has been running 1.68 to 2.30  4/24- will not increase baclofen  due to Renal issues 5/9- will add Dantrolene 50 mg at bedtime to reduce chance of spasticity but also reduce chance of side effects/sedation- - will monitor -11/07/23 stopped baclofen  d/t delirium, cont dantrolene -5/12 continue  dantrolene, continue hold baclofen  5/13- will increase Dantrolene after 5 days 5/14- increase Dantrolene to 50 mg BID 5/15- no side effects so far 12.  PAD s/p SFA and Iliac stenting, continue metoprolol  50mg  daily  13. CKD3b? With AKI -4/22- Pt's Cr down to 1.50 from 2.29 2 days ago and 1.68 yesterday- will monitor 2x/week to see if will reduce further or be labile.   4/24- Cr 1.84- and BUN stable at 55- is drinking per pt  4/28- Cr 1.63- doing better 4/29- Cr 1.59 after 12+ hours of IVF's- and BUN very slightly down to 70 from 72-   5/1- Cr down to 1.43 and BUN down to 53  5/5- Cr 1.51 and BUN down to 37- drinking more!  5/8- Cr 1.8- up from 1.51- but BUN 34 down from 37- will push fluids  5/12 creatinine and BUN much lower at 1.33/26-improved  5/15- Cr 1.68 and BUN 30 this AM- BUN improving- but Cr bounces 14. neurogenic bowel: continue miralax  32g daily, senokot s 2 tabs qAM  5/1- LBM this Am- was accident- might need bowel program 5/2- Placed on suppository nightly with possible dig stim- had multiple BM's during/after bowel program- if continues like this, will need dig stim.   -10/31/23 LBM last night x2 5/5- LBM last night with bowel program 5/6- BM last evening- no bowel note for last few nights seen 5/7- LBM 2 nights ago- no bowel note- d/w nursing coordinator 11/07/23 LBM last night 5/12 LBM night before last.  Patient  reports he was not given his suppository last night-appears documented. KUB and LFTS ordered 5/13- LFTs look good- having nightly BM's with bowel program- need to teach wife to do 5/14- pt going to SNF- wife doesn't want to learn bowel program 5/15- good results- with bowel program 15. HLD: crestor  20mg  nightly 16. Delirium/Confusion 4/29- yesterday given IVF's for this- checked U/A and Cx- which showed mod leuks, however rate bacteria and very little WBC's 6-10- which is not a UTI in most cases- will d/w pharmacy-  -pt still pleasantly confused this AM- but  didn't even open eyes, was so sleepy, so hard to tease out what is due to sleepiness and what is actual delirium- per nurse, much better last night-  4/30- pt Ox3 this AM- very with it- likely flexeril  based on chart review- given for pain since on no pain meds- will keep off Flexeril  and monitor Sx's  5/5- Sx's resolved  -11/07/23 SEE ABOVE 17. Hx of HTN with severe hypotension 4/30- pt's BP dropped to 79 systolic yesterday when sitting with OT- not clear if had TEDS/ACE wraps- will reduce Metoprolol  XL to 12.5 mg daily- from 50 mg daily and see if that helps- don't want ot add midodrine  quite yet.  5/1- Adding midodrine  5 mg TID and adding abd binder- couldn't tolerate ACE wraps at all- just TED's-   5/2- BP yesterday was less symptomatic- but still dropped some- con't regimen -5/3-4/25 BPs a bit better recently; monitor 5/6-5/11 BP doing better- con't regimen 5/12 orthostatic hypotension noted this morning, bolus IVF 500cc. 5/13- Will increase Midodrine  to 7.5 mg TID 5/14- 5/15- BP slightly better actually has 1 episode of 155 systolic so wait to increase Midodrine  Vitals:   11/08/23 1550 11/08/23 1922 11/09/23 0436 11/09/23 0900  BP: (!) 114/59 (!) 111/49 (!) 100/55 (!) 116/56   11/09/23 1555 11/09/23 1959 11/10/23 0520 11/10/23 0745  BP: (!) 113/56 (!) 111/56 (!) 120/52 107/60   11/10/23 1300 11/10/23 1832 11/11/23 0314 11/11/23 0805  BP: 105/82 (!) 116/51 (!) 121/57 (!) 155/69     18. Atelectasis 4/30- sounds coarse- asked pt to use ICS regularly- is at bedside- also described "spitting things up"_ had "spit cup"- at bedside-  had barely anything in it. 5/5- not spitting up anymore  19. R heel DTI and Stage I on sacrum 5/2- is new- ordered prevalon boots as well as low air loss mattress and needs to be turned q2 hours- placed order  5/8- pt not able to tolerate air mattress- turn q2 hours and prevalons for Heel DTI- d/w nursing- just removed this AM 5/12 patient asked for device to  keep pressure off his heel, Prevalon boots at bedside. Will chek with nursing regarding use of these boots at night.  20. Spasticity  5/5- will d/w wife tomorrow about restarting Baclofen  vs Dantrolene.   5/6- attempted to call wife again x2- cannot get her 5/7- was able to see wife yesterday and discussed- we will retry Pt on Baclofen  5 mg TID for now- not a higher dose due to renal issues-  and monitor for confusion/delirium- if gets confused, will try Dantrolene.   5/8- no confusion and RLE much less pain- so think it's working 5/9- will add Dantrolene as above 5/14- Increased dantrolene to 50 mg BID 21. Severe malnutrition  5/5- per Dietitian- will do supplements 22. Mild tachycardia 5/8- per pt, has ALWAYS had tachycardia- has been worked up- and nothing found- usually 90s-100's per pt- which is the same here-  will monitor 23. Ileus???  5/13- reached out to Radiology about pt's questionable ileus- not clear in Radiology report  5/14- doesn't have ileus- but full of gas- started Simethicone  QID 24. Nasal congestion with mild cough  5/15- probably due to post nasal drip- will start Flonase 2 sprays each nare daily.    I spent a total of 39   minutes on total care today- >50% coordination of care- due to  D/w nursing about pt needing table in front of him ot drink water; also Flonase and went over labs and vitals-   LOS: 24 days A FACE TO FACE EVALUATION WAS PERFORMED  Jionni Helming 11/11/2023, 8:37 AM

## 2023-11-11 NOTE — Progress Notes (Signed)
 Occupational Therapy Session Note  Patient Details  Name: Christian Bailey MRN: [id number] Date of Birth: [birth year]  Today's Date: 11/11/2023 OT Individual Time: 4696-2952 & 1414-1530 OT Individual Time Calculation (min): 87 min & 75 min   Short Term Goals: Week 4:  OT Short Term Goal 1 (Week 4): Pt will complete UB dressing at Min A consistently throughout sessions OT Short Term Goal 2 (Week 4): Pt will complete dynamic sitting with SBA in preparation for ADL tasks OT Short Term Goal 3 (Week 4): Pt will complete grooming at SBA seated in W/C  Skilled Therapeutic Interventions/Progress Updates:      Therapy Documentation Precautions:  Precautions Precautions: Fall, Cervical Precaution/Restrictions Comments: decreased recall of precautions, not able to verbalize any when prompted Required Braces or Orthoses: Cervical Brace Cervical Brace: Soft collar Restrictions Weight Bearing Restrictions Per Provider Order: No Session 1 General: "I feel so much better" Pt supine in bed upon OT arrival, agreeable to OT session.  Pain:  5/10 pain reported in BLE, meds given at beginning of session, activity, intermittent rest breaks, distractions provided for pain management, pt reports tolerable to proceed.   ADL: OT providing skilled intervention on ADL retraining in order to increase independence with tasks and increase activity tolerance. Pt completed the following tasks at the current level of assist: Bed mobility: Max A rolling Rt and Lt and supine><EOB, able to maintain grasp on bed rail once turned and maintain trunk support SBA at EOB, OT educating pt to prop on elbow when going back to bed with increased independence UB dressing: Max A, able to somewhat assist with threading BUE into sleeves, assist required for over head for donning and doffing LB dressing: total A bed level, rolling Lt and Rt to manage over waist Footwear: total A for managing TED hose and socks Shower transfer: total A  for stedy then using roll in shower chair for safety Bathing: Max A, able to wash upper thigh, lower stomach and face, required assistance for rest of body d/t shoulder mobility  Other Treatments: OT changing foam dressings on bilateral heels and buttocks for prevention of further skin breakdown    Pt supine in bed with bed alarm activated, 3 bed rails up, call light within reach and 4Ps assessed.   Session 2 General: "That was fun!" Pt seated in W/C upon OT arrival, agreeable to OT.  Pain: no pain reported  Exercises: Pt participated in individual BUE and BLE dance exercises in social setting including reaching out of BOS, BUE and BLE ROM in all planes while seated in W/C in order to increase generalized strength/endurance to complete ADLs such as dressing with increased independence. OT providing skilled intervention to manage/stretch BLE for increased proprioception/strength and tone management. Pt no noted LOB/SOB while participating. Pt required no VC to participate. OT then provided skilled intervention after dance group for energy conservation. Total A for lateral transfer on transfer board and Mod A for bed mobility, pt able to prop self on elbow while OT managing BLE.    Pt supine in bed with bed alarm activated, 2 bed rails up, call light within reach and 4Ps assessed.   Therapy/Group: Individual Therapy  Nila Barth, OTD, OTR/L 11/11/2023, 3:36 PM

## 2023-11-11 NOTE — Plan of Care (Signed)
 Problem: Consults Goal: RH GENERAL PATIENT EDUCATION Description: See Patient Education module for education specifics. 11/11/2023 1620 by Jenetta Misty, RN Outcome: Progressing 11/11/2023 1603 by Jenetta Misty, RN Outcome: Progressing   Problem: RH BOWEL ELIMINATION Goal: RH STG MANAGE BOWEL WITH ASSISTANCE Description: STG Manage Bowel with mod I  Assistance. 11/11/2023 1620 by Jenetta Misty, RN Outcome: Progressing 11/11/2023 1603 by Jenetta Misty, RN Outcome: Progressing Goal: RH STG MANAGE BOWEL W/MEDICATION W/ASSISTANCE Description: STG Manage Bowel with Medication with mod I Assistance. 11/11/2023 1620 by Jenetta Misty, RN Outcome: Progressing 11/11/2023 1603 by Jenetta Misty, RN Outcome: Progressing   Problem: RH BLADDER ELIMINATION Goal: RH STG MANAGE BLADDER WITH ASSISTANCE Description: STG Manage Bladder With toileting / mod I Assistance 11/11/2023 1620 by Jenetta Misty, RN Outcome: Progressing 11/11/2023 1603 by Jenetta Misty, RN Outcome: Progressing Goal: RH STG MANAGE BLADDER WITH MEDICATION WITH ASSISTANCE Description: STG Manage Bladder With Medication With mod I Assistance. 11/11/2023 1620 by Jenetta Misty, RN Outcome: Progressing 11/11/2023 1603 by Jenetta Misty, RN Outcome: Progressing   Problem: RH SAFETY Goal: RH STG ADHERE TO SAFETY PRECAUTIONS W/ASSISTANCE/DEVICE Description: STG Adhere to Safety Precautions With cues Assistance/Device. 11/11/2023 1620 by Jenetta Misty, RN Outcome: Progressing 11/11/2023 1603 by Jenetta Misty, RN Outcome: Progressing   Problem: RH PAIN MANAGEMENT Goal: RH STG PAIN MANAGED AT OR BELOW PT'S PAIN GOAL Description: < 4 with prns 11/11/2023 1620 by Jenetta Misty, RN Outcome: Progressing 11/11/2023 1603 by Jenetta Misty, RN Outcome: Progressing   Problem: RH KNOWLEDGE DEFICIT GENERAL Goal: RH STG INCREASE KNOWLEDGE OF SELF CARE AFTER HOSPITALIZATION Description: Patient and wife will be able to manage care at  discharge using educational resources for medication, skin care,dietary modification and fall prevention independently 11/11/2023 1620 by Jenetta Misty, RN Outcome: Progressing 11/11/2023 1603 by Jenetta Misty, RN Outcome: Progressing   Problem: Education: Goal: Ability to describe self-care measures that may prevent or decrease complications (Diabetes Survival Skills Education) will improve 11/11/2023 1620 by Jenetta Misty, RN Outcome: Progressing 11/11/2023 1603 by Jenetta Misty, RN Outcome: Progressing Goal: Individualized Educational Video(s) 11/11/2023 1620 by Jenetta Misty, RN Outcome: Progressing 11/11/2023 1603 by Jenetta Misty, RN Outcome: Progressing   Problem: Coping: Goal: Ability to adjust to condition or change in health will improve 11/11/2023 1620 by Jenetta Misty, RN Outcome: Progressing 11/11/2023 1603 by Jenetta Misty, RN Outcome: Progressing   Problem: Fluid Volume: Goal: Ability to maintain a balanced intake and output will improve 11/11/2023 1620 by Jenetta Misty, RN Outcome: Progressing 11/11/2023 1603 by Jenetta Misty, RN Outcome: Progressing   Problem: Health Behavior/Discharge Planning: Goal: Ability to identify and utilize available resources and services will improve 11/11/2023 1620 by Jenetta Misty, RN Outcome: Progressing 11/11/2023 1603 by Jenetta Misty, RN Outcome: Progressing Goal: Ability to manage health-related needs will improve 11/11/2023 1620 by Jenetta Misty, RN Outcome: Progressing 11/11/2023 1603 by Jenetta Misty, RN Outcome: Progressing   Problem: Metabolic: Goal: Ability to maintain appropriate glucose levels will improve 11/11/2023 1620 by Jenetta Misty, RN Outcome: Progressing 11/11/2023 1603 by Jenetta Misty, RN Outcome: Progressing   Problem: Nutritional: Goal: Maintenance of adequate nutrition will improve 11/11/2023 1620 by Jenetta Misty, RN Outcome: Progressing 11/11/2023 1603 by Jenetta Misty, RN Outcome:  Progressing Goal: Progress toward achieving an optimal weight will improve 11/11/2023 1620 by Jenetta Misty, RN Outcome: Progressing 11/11/2023 1603 by  Nena Hampe L, RN Outcome: Progressing   Problem: Skin Integrity: Goal: Risk for impaired skin integrity will decrease 11/11/2023 1620 by Jenetta Misty, RN Outcome: Progressing 11/11/2023 1603 by Jenetta Misty, RN Outcome: Progressing   Problem: Tissue Perfusion: Goal: Adequacy of tissue perfusion will improve 11/11/2023 1620 by Jenetta Misty, RN Outcome: Progressing 11/11/2023 1603 by Jenetta Misty, RN Outcome: Progressing

## 2023-11-11 NOTE — Plan of Care (Signed)
  Problem: Consults Goal: RH GENERAL PATIENT EDUCATION Description: See Patient Education module for education specifics. Outcome: Progressing   Problem: RH BOWEL ELIMINATION Goal: RH STG MANAGE BOWEL WITH ASSISTANCE Description: STG Manage Bowel with mod I  Assistance. Outcome: Progressing Goal: RH STG MANAGE BOWEL W/MEDICATION W/ASSISTANCE Description: STG Manage Bowel with Medication with mod I Assistance. Outcome: Progressing   Problem: RH BLADDER ELIMINATION Goal: RH STG MANAGE BLADDER WITH ASSISTANCE Description: STG Manage Bladder With toileting / mod I Assistance Outcome: Progressing Goal: RH STG MANAGE BLADDER WITH MEDICATION WITH ASSISTANCE Description: STG Manage Bladder With Medication With mod I Assistance. Outcome: Progressing   Problem: RH SAFETY Goal: RH STG ADHERE TO SAFETY PRECAUTIONS W/ASSISTANCE/DEVICE Description: STG Adhere to Safety Precautions With cues Assistance/Device. Outcome: Progressing   Problem: RH PAIN MANAGEMENT Goal: RH STG PAIN MANAGED AT OR BELOW PT'S PAIN GOAL Description: < 4 with prns Outcome: Progressing   Problem: RH KNOWLEDGE DEFICIT GENERAL Goal: RH STG INCREASE KNOWLEDGE OF SELF CARE AFTER HOSPITALIZATION Description: Patient and wife will be able to manage care at discharge using educational resources for medication, skin care,dietary modification and fall prevention independently Outcome: Progressing   Problem: Education: Goal: Ability to describe self-care measures that may prevent or decrease complications (Diabetes Survival Skills Education) will improve Outcome: Progressing Goal: Individualized Educational Video(s) Outcome: Progressing   Problem: Coping: Goal: Ability to adjust to condition or change in health will improve Outcome: Progressing   Problem: Fluid Volume: Goal: Ability to maintain a balanced intake and output will improve Outcome: Progressing   Problem: Health Behavior/Discharge Planning: Goal: Ability  to identify and utilize available resources and services will improve Outcome: Progressing Goal: Ability to manage health-related needs will improve Outcome: Progressing   Problem: Metabolic: Goal: Ability to maintain appropriate glucose levels will improve Outcome: Progressing   Problem: Nutritional: Goal: Maintenance of adequate nutrition will improve Outcome: Progressing Goal: Progress toward achieving an optimal weight will improve Outcome: Progressing   Problem: Skin Integrity: Goal: Risk for impaired skin integrity will decrease Outcome: Progressing   Problem: Tissue Perfusion: Goal: Adequacy of tissue perfusion will improve Outcome: Progressing

## 2023-11-11 NOTE — Progress Notes (Signed)
 Physical Therapy Session Note  Patient Details  Name: Christian Bailey MRN: [id number] Date of Birth: Mar 11, 1941  Today's Date: 11/11/2023 PT Individual Time: 1018-1100 PT Individual Time Calculation (min): 42 min   Short Term Goals: Week 3:  PT Short Term Goal 1 (Week 3): Pt will perform bed mobility with mod A PT Short Term Goal 2 (Week 3): Pt will progress transfers to max A PT Short Term Goal 3 (Week 3): Pt will be able to tolerate x 5 min in standing for WB/postural control retraining/tone management  Skilled Therapeutic Interventions/Progress Updates:     Pt received supine in bed asleep. Pt requires extended time to awaken with tactile rub on chest. Pt performs supine to sit with logrolling and maxA, with cues for sequencing and positioning at EOB. Pt attempts stand step transfer and on initial attempt, PT notes minimal to no activation of legs. Pt returns to seated and PT provides cues for optimal motor planning and sequencing. On 2nd attempt pt able to actively participate more in transfer, though still requires maxA/totalA for sit to stand. In standing, PT provides cues for lateral wieght shifting and stepping toward WC. Pt is able to progress LLE but requires manual assistance to move RLE. TotalA provided for stand step transfer to power WC. Pt then drives WC x250' to gym to work on mobility and AD management. PT reclines power WC and pt performs chest press with 2lb bar x10, with cues for correct performance and tactile cues for NM feedback. Following rest break, pt completes x10 with 4lb bar. Pt drive WC back to room. Left seated in reclined power WC with all needs within reach.     Therapy Documentation Precautions:  Precautions Precautions: Fall, Cervical Precaution/Restrictions Comments: decreased recall of precautions, not able to verbalize any when prompted Required Braces or Orthoses: Cervical Brace Cervical Brace: Soft collar Restrictions Weight Bearing Restrictions Per  Provider Order: No   Therapy/Group: Individual Therapy  Neva Barban, PT, DPT 11/11/2023, 4:43 PM

## 2023-11-12 DIAGNOSIS — G959 Disease of spinal cord, unspecified: Secondary | ICD-10-CM | POA: Diagnosis not present

## 2023-11-12 LAB — GLUCOSE, CAPILLARY
Glucose-Capillary: 123 mg/dL — ABNORMAL HIGH (ref 70–99)
Glucose-Capillary: 224 mg/dL — ABNORMAL HIGH (ref 70–99)
Glucose-Capillary: 233 mg/dL — ABNORMAL HIGH (ref 70–99)
Glucose-Capillary: 261 mg/dL — ABNORMAL HIGH (ref 70–99)

## 2023-11-12 MED ORDER — FLUTICASONE PROPIONATE 50 MCG/ACT NA SUSP: 2.0000 | Freq: Every day | NASAL | Status: DC | PRN

## 2023-11-12 NOTE — Group Note (Signed)
 Patient Details Name: Ettore Erkkila MRN: [id number] DOB: 10/24/1940 Today's Date: 11/12/2023  Time Calculation: OT Group Time Calculation OT Group Start Time: 1100 OT Group Stop Time: 1200 OT Group Time Calculation (min): 60 min      Group Description: Fine motor: Pt participated in group session with a focus on bilateral UE use  functional reach, problem solving, and social interaction. Pt actively participated in group game of Bingo  where pts were instructed on rules of game; pt had opportunity to pick up small manipulatives (chips) and a marker and engage core in leaning forward to engage with his game board. Pt's game board is adjusted for visual deficits.  Min A for weight shirt forward to reach table.   Pain:  No c/o      Henrene Locust [facility] 11/12/2023, 2:46 PM

## 2023-11-12 NOTE — Progress Notes (Signed)
 Patient ID: Christian Bailey, male   DOB: 1940-07-10, 83 y.o.   MRN: [id number]  reached out to [facility] and [facility] to see if could offer a bed. Awaiting return calls.

## 2023-11-12 NOTE — Progress Notes (Signed)
 Physical Therapy Weekly Progress Note  Patient Details  Name: Christian Bailey MRN: [id number] Date of Birth: 11/18/1940  Beginning of progress report period: Nov 03, 2023 End of progress report period: Nov 12, 2023    Patient has met 2 of 3 short term goals.  Pt is making slow progress overall but demonstrating improvements in ability to perform slideboard transfers (mod to max +2) and participate in standing/NMR for gait with +2 total assist. Due to family support and level of required assist, pt's family and pt decided on SNF for d/c plan after coming in to observe for family education.   Patient continues to demonstrate the following deficits muscle weakness and muscle joint tightness, decreased cardiorespiratoy endurance, abnormal tone, unbalanced muscle activation, and decreased coordination, decreased visual acuity, and decreased sitting balance, decreased standing balance, decreased postural control, and decreased balance strategies and therefore will continue to benefit from skilled PT intervention to increase functional independence with mobility.  Patient progressing toward long term goals..  Continue plan of care. Discharged car transfer goal due to d/c plan to SNF.  PT Short Term Goals Week 3:  PT Short Term Goal 1 (Week 3): Pt will perform bed mobility with mod A PT Short Term Goal 1 - Progress (Week 3): Partly met PT Short Term Goal 2 (Week 3): Pt will progress transfers to max A PT Short Term Goal 2 - Progress (Week 3): Met PT Short Term Goal 3 (Week 3): Pt will be able to tolerate x 5 min in standing for WB/postural control retraining/tone management PT Short Term Goal 3 - Progress (Week 3): Met Week 4:  PT Short Term Goal 1 (Week 4): Pt will be able to progress lateral leans for slideboard placement with max A PT Short Term Goal 2 (Week 4): Pt will be able to perform rolling with bed rail features with with mod assist PT Short Term Goal 3 (Week 4): Pt will be able to tolerate WB  in standing x 10 min for tone management and NMR  Skilled Therapeutic Interventions/Progress Updates:  Ambulation/gait training;Cognitive remediation/compensation;Discharge planning;DME/adaptive equipment instruction;Functional mobility training;Pain management;Psychosocial support;Splinting/orthotics;Therapeutic Activities;UE/LE Strength taining/ROM;Visual/perceptual remediation/compensation;Wheelchair propulsion/positioning;UE/LE Coordination activities;Therapeutic Exercise;Stair training;Patient/family education;Skin care/wound management;Balance/vestibular training;Community reintegration;Disease management/prevention;Neuromuscular re-education;Functional electrical stimulation   Therapy Documentation Precautions:  Precautions Precautions: Fall, Cervical Precaution/Restrictions Comments: decreased recall of precautions, not able to verbalize any when prompted Required Braces or Orthoses: Cervical Brace Cervical Brace: Soft collar Restrictions Weight Bearing Restrictions Per Provider Order: No   Faye Hoops, PT, DPT, CBIS  11/12/2023, 10:56 AM

## 2023-11-12 NOTE — Progress Notes (Signed)
 Nutrition Follow-up  DOCUMENTATION CODES:   Severe malnutrition in context of chronic illness  INTERVENTION:   Ensure Enlive po BID, each supplement provides 350 kcal and 20 grams of protein. Prosource Plus 30 ml PO once daily, each packet provides 100 kcal and 15 gm protein. MVI with minerals daily. Vitamin C 500 mg once daily x 30 days to support wound healing.  Zinc sulfate 220 mg daily x 14 days to support wound healing.  NUTRITION DIAGNOSIS:   Severe Malnutrition related to chronic illness (cervical myelopathy) as evidenced by severe muscle depletion, percent weight loss (15% weight loss x 6 months).  Ongoing   GOAL:   Patient will meet greater than or equal to 90% of their needs  Progressing   MONITOR:   PO intake, Supplement acceptance, Skin  REASON FOR ASSESSMENT:   Consult Wound healing  ASSESSMENT:   83 yo male admitted with acute incomplete quadriplegia with R>L LE weakness d/t cervical myelopathy S/P C5-7 ACDF on 4/15. PMH includes osteoarthritis, HLD, anemia, HTN, DM-2, severe PAD, macular degeneration/retinal detachment, polyneuropathy, recurrent falls, GERD.  Patient sleeping during RD visit. He did not wake up when name called x 3. He is on a regular diet, eating well, consuming 50-100% of most meals with average meal intake 78%. He is also drinking Ensure supplements BID most days and taking Prosource Plus daily. Also receiving vitamin C and zinc to support wound healing.   Plans for eventual d/c to SNF with continued PT to work on improving muscle weakness.  Labs reviewed.  CBG: 123-237 x 24 hours  Medications reviewed and include vitamin C, dulcolax, colace, ferrous sulfate , novolog , tradjenta , MVI with minerals, senokot-s, simethicone , flomax , zinc sulfate.  No new weight since admission (60.6 kg)  Diet Order:   Diet Order             Diet regular Room service appropriate? Yes with Assist; Fluid consistency: Thin  Diet effective now                    EDUCATION NEEDS:   Education needs have been addressed  Skin:  Skin Assessment: Skin Integrity Issues: Skin Integrity Issues:: DTI, Stage I DTI: R heel Stage I: sacrum Stage II: -  Last BM:  5/15  Height:   Ht Readings from Last 1 Encounters:  10/18/23 5\' 4"  (1.626 m)    Weight:   Wt Readings from Last 1 Encounters:  10/18/23 60.6 kg    Ideal Body Weight:  59.1 kg  BMI:  Body mass index is 22.93 kg/m.  Estimated Nutritional Needs:   Kcal:  1700-1900  Protein:  85-95 gm  Fluid:  1.7-1.9 L   Barnet Boots RD, LDN, CNSC Contact via secure chat. If unavailable, use group chat "RD Inpatient."

## 2023-11-12 NOTE — Progress Notes (Signed)
 PROGRESS NOTE   Subjective/Complaints:  Pt reports nose isn't blocked up anymore- happy with this.   Slept OK.  Admits doesn't eat much esp for breakfast- ate 25-50% of meal.  Great day yesterday Per nursing, had only mucus out for bowel program- we discussed monitoring since usually goes daily, and if no BM tonight, might need Sorbitol .  ROS:   Pt denies SOB, abd pain, CP, N/V/C/D, and vision changes   + Heel discomfort- on R side- less so + nausea/vomiting- -hasn't recurred   Objective:   No results found.    No results for input(s): "WBC", "HGB", "HCT", "PLT" in the last 72 hours.     Recent Labs    11/11/23 0541  NA 137  K 4.0  CL 100  CO2 28  GLUCOSE 154*  BUN 30*  CREATININE 1.68*  CALCIUM  8.5*       Intake/Output Summary (Last 24 hours) at 11/12/2023 0854 Last data filed at 11/12/2023 0800 Gross per 24 hour  Intake 958 ml  Output 850 ml  Net 108 ml     Pressure Injury 10/28/23 Sacrum Medial Stage 1 -  Intact skin with non-blanchable redness of a localized area usually over a bony prominence. STAGE 1 ON SACRUM (Active)  10/28/23 1333  Location: Sacrum  Location Orientation: Medial  Staging: Stage 1 -  Intact skin with non-blanchable redness of a localized area usually over a bony prominence.  Wound Description (Comments): STAGE 1 ON SACRUM  Present on Admission: No     Pressure Injury 10/28/23 Heel Right Deep Tissue Pressure Injury - Purple or maroon localized area of discolored intact skin or blood-filled blister due to damage of underlying soft tissue from pressure and/or shear. DTI on bottom of heel (Active)  10/28/23 1820  Location: Heel  Location Orientation: Right  Staging: Deep Tissue Pressure Injury - Purple or maroon localized area of discolored intact skin or blood-filled blister due to damage of underlying soft tissue from pressure and/or shear.  Wound Description (Comments):  DTI on bottom of heel  Present on Admission: No    Physical Exam: Vital Signs Blood pressure 133/73, pulse (!) 102, temperature 97.8 F (36.6 C), resp. rate 17, height 5\' 4"  (1.626 m), weight 60.6 kg, SpO2 97%.         General: awake, alert, appropriate, sitting up eating- 25-50% of meal eaten; NAD HENT: conjugate gaze; oropharynx moist CV: regular to borderline tachycardic  rate and rhythm; no JVD Pulmonary: CTA B/L; no W/R/R- good air movement GI: soft, NT, ND, (+)BS Psychiatric: appropriate- slightly more flat today Neurological: Ox3  GU- trace light yellow amber urine-no change  Skin: Heels covered with foam dressings B/L- R heel DTI and Sacral Stage I Wearing prevalons B/L MSK: Ue's 4/5 B/L throughout LE's- 2-/5 B/L-in HF/KE- 4-/5 DF/PF but RLE weaker than LLE   Prior exam Spasticity- MAS of 1+ to 2 in Ue's this Am and 3 in RLE- MAS worse this AM- 2-3 throughout Neurologic: Cranial nerves II through XII intact, motor strength is 4/5 in bilateral  bicep, tricep, grip,and finger ext and hand intrinsics, 3- RIght delt and 4/5 left delt, 3- bilateral  hip flexor, 3-  RIght and 4- left knee extensors, 4/5 bilateral ankle dorsiflexor and plantar flexor Sensory exam normal sensation to light touch and proprioception in bilateral upper and lower extremities Tone- 3+ a bilateral biceps triceps and BR, + Hoffman's bilateral 2/5 RIght 3+ left knee Also MAS 3 tone in RIght Knee flexors and extensors, MAS 1 at the left knee Clonus at bilateral ankles  Musculoskeletal: Full range of motion in all 4 extremities. No joint swelling      Assessment/Plan: 1. Functional deficits which require 3+ hours per day of interdisciplinary therapy in a comprehensive inpatient rehab setting. Physiatrist is providing close team supervision and 24 hour management of active medical problems listed below. Physiatrist and rehab team continue to assess barriers to discharge/monitor patient progress  toward functional and medical goals  Care Tool:  Bathing              Bathing assist Assist Level: Total Assistance - Patient < 25%     Upper Body Dressing/Undressing Upper body dressing   What is the patient wearing?: Pull over shirt    Upper body assist Assist Level: Moderate Assistance - Patient 50 - 74%    Lower Body Dressing/Undressing Lower body dressing      What is the patient wearing?: Underwear/pull up, Pants     Lower body assist Assist for lower body dressing: Dependent - Patient 0%     Toileting Toileting    Toileting assist Assist for toileting: Dependent - Patient 0%     Transfers Chair/bed transfer  Transfers assist     Chair/bed transfer assist level: Maximal Assistance - Patient 25 - 49%     Locomotion Ambulation   Ambulation assist   Ambulation activity did not occur: Safety/medical concerns  Assist level: 2 helpers Assistive device: [name] Max distance: 10'   Walk 10 feet activity   Assist  Walk 10 feet activity did not occur: Safety/medical concerns  Assist level: 2 helpers Assistive device: Walker-Eva   Walk 50 feet activity   Assist Walk 50 feet with 2 turns activity did not occur: Safety/medical concerns         Walk 150 feet activity   Assist Walk 150 feet activity did not occur: Safety/medical concerns         Walk 10 feet on uneven surface  activity   Assist Walk 10 feet on uneven surfaces activity did not occur: Safety/medical concerns         Wheelchair     Assist Is the patient using a wheelchair?: Yes Type of Wheelchair: Power    Wheelchair assist level: Supervision/Verbal cueing Max wheelchair distance: 200    Wheelchair 50 feet with 2 turns activity    Assist        Assist Level: Supervision/Verbal cueing   Wheelchair 150 feet activity     Assist      Assist Level: Supervision/Verbal cueing   Blood pressure 133/73, pulse (!) 102, temperature 97.8 F  (36.6 C), resp. rate 17, height 5\' 4"  (1.626 m), weight 60.6 kg, SpO2 97%.  Medical Problem List and Plan: 1. 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  Antithrombotics: -DVT/anticoagulation:  Mechanical: Sequential compression devices, below knee Bilateral lower extremities 4/23- will see if Dr Ellery Guthrie will allow us  to start Lovenox - he said yes, so will start. 30mg  daily. 5/14- when leaves, will go on Eliquis for a total of 3 months 2.5 mg BID             -antiplatelet therapy: N/A 3. Pain Management: tylenol  prn. Voltaren  QID, lyrica  50mg  BID, flexeril  PRN -10/24/23 leg muscle soreness likely from PT/OT this week; will start muscle rub cream 4/28- still c/o muscle soreness in legs- con't pain regimen as needed 4/30- still sore, but don't want Flexeril  since likely caused confusion- con't tylenol  prn- can use muscle rub which is ordered 5/1- denies a lot of pain- mainly sore form spasticity/working out 5/5- pain yesterday- not today-  5/7- will add Tramadol  50 mg q8 hours prn if pain won't go away otherwise-  due to renal function- doing TID prn- hopefully baclofen  helps pain in RLE as well 5/8- pain better since started baclofen - didn't need tramadol  5/9- will add Dantrolene 50 mg at bedtime and see if tolerates- if so, will con't to increase the dose to help spasticity and reduce chance of sedation/side effects  -11/07/23 very delirious yesterday per pt, will stop baclofen  and leave Dantrolene for now; monitor -5/12 continue current regimen, mentation sounds like it is better today 5/13- Dantrolene not making confused so far- will increase after 5 days- added baclofen  and Flexeril  to allergy  list 5/14- Doing better with tylenol  and muscle rub 4. Mood/Behavior/Sleep: LCSW to follow up for evaluation and support when appropriate --Delirium precautions--His mental status has cleared but will monitor for worsening with transfer to new unit .pt has not required prn meds for agitation in several days  Monitor sleep wake cycle             -antipsychotic agents: Change haldol  q 6 hrs prn to Seroquel  12.5mg  tid prn  -Restoril  7.5mg  nightly, cymbalta  20mg  daily 4/23-4/25 no delirium so far 4/28- having some delirium this AM? Not severe, but will monitor since has had in recent past 4/29- still confused this AM- but per nursing , was less confused overnight after Restoril  stopped- still pleasantly confused this AM when first woke up- didn't open eyes.  5/9 Aox3 this AM -11/07/23 delirious yesterday, stopped baclofen  as above. 5/14- resolved  5. Neuropsych/cognition: This patient is usually  capable of making decisions on his own behalf. 6. Skin/Wound Care: Routine pressure relief measures. 7. Fluids/Electrolytes/Nutrition:  Monitor I/O. Routine labs. Continue vitamins/supplements. 8. 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 Willye Harvey, per note, leave in place for 7-10d, Consider resuming In-N-Out catheterization for management of his neurogenic bladder at that time. Recommend using a coud catheter for intermittent catheterization (when resumed).  4/28- will leave foley for 7-10 days 10/31/23 foley likely ready to come out tomorrow? Placed 4/25 5/5- will d/c foley in AM after speaking with pt 5/6- pt wants ot do what wife wants- cannot reach wife to discuss foley removal- pt  doesn't want to do cathing and sounds like would rather keep foley- but will discuss with him further 5/7- pt  and wife have decided they don't want to remove foley - they don't want ot go back to in/out caths-  since wasn't emptying before- they want a chronic foley- when offered to remove foley just for rehab, to see if he gets return, he is scared will get bleeding again, due to false passage already had and declined 5/12 Foley draining clear yellow urine 5/16- wife still wants and if going to SNF, will need to keep 10.  Diabetes- A1c 8.0-  with peripheral neuropathy by hx only on Jardiance at home, use SSI in the hospital  -10/23/23 CBGs variable but likely d/t steroids; cont SSI for now 4/29- CBG's very labile in last 24 hours 99-383- - it concerns me to start Jardiance since of Foley and risk of UTI's- also for starting Tradjenta  since BG was 99 last night- don't want to drop BG too much 4/30- Will add Tradjenta  since BG's still running 150-242- will monitor closely for CBG hypoglycemia-   10/30/23 CBGs looking great!  -10/31/23 CBGs a little higher the last day, but monitor for trend  5/5- Cbgs all less than 200- con't to monitor  5/6- CBGs 130s- 180s- 5/7-5/9 CBG's 128 to 220- had snack yesterday- still snacking in afternoon -5/10-11/25 CBGs great except for afternoon d/t snacks. Leave meds alone for now -5/12 fair glucose control, continue current regimen and monitor. 5/13- 5/15- great control except for afternoon- snacking- will d/w pt 5/16- Cbgs jumping higher- if this continues to occur, will need other meds for CBG's CBG (last 3)  Recent Labs    11/11/23 1605 11/11/23 2056 11/12/23 0619  GLUCAP 237* 210* 123*     11.  Spasticity affecting LEs> UE and R>L , aggressive ROM  with PT, consider oral meds  4/22- starting baclofen  5 mg TID- with his Cr 1.50- has been running 1.68 to 2.30  4/24- will not increase baclofen  due to Renal issues 5/9- will add Dantrolene 50 mg at bedtime to  reduce chance of spasticity but also reduce chance of side effects/sedation- - will monitor -11/07/23 stopped baclofen  d/t delirium, cont dantrolene -5/12 continue dantrolene, continue hold baclofen  5/13- will increase Dantrolene after 5 days 5/14- increase Dantrolene to 50 mg BID 5/15- no side effects so far 12.  PAD s/p SFA and Iliac stenting, continue metoprolol  50mg  daily  13. CKD3b? With AKI -4/22- Pt's Cr down to 1.50 from 2.29 2 days ago and 1.68 yesterday- will monitor 2x/week to see if will reduce further or be labile.   4/24- Cr 1.84- and BUN stable at 55- is drinking per pt  4/28- Cr 1.63- doing better 4/29- Cr 1.59 after 12+ hours of IVF's- and BUN very slightly down to 70 from 72-   5/1- Cr down to 1.43 and BUN down to 53  5/5- Cr 1.51 and BUN down to 37- drinking more!  5/8- Cr 1.8- up from 1.51- but BUN 34 down from 37- will push fluids  5/12 creatinine and BUN much lower at 1.33/26-improved  5/15- Cr 1.68 and BUN 30 this AM- BUN improving- but Cr bounces 14. neurogenic bowel: continue miralax  32g daily, senokot s 2 tabs qAM  5/1- LBM this Am- was accident- might need bowel program 5/2- Placed on suppository nightly with possible dig stim- had multiple BM's during/after bowel program- if continues like this, will need dig stim.   -10/31/23 LBM last night x2 5/5- LBM last night with bowel program 5/6- BM last evening- no bowel note for last few nights seen 5/7-  LBM 2 nights ago- no bowel note- d/w nursing coordinator 11/07/23 LBM last night 5/12 LBM night before last.  Patient reports he was not given his suppository last night-appears documented. KUB and LFTS ordered 5/13- LFTs look good- having nightly BM's with bowel program- need to teach wife to do 5/14- pt going to SNF- wife doesn't want to learn bowel program 5/15- good results- with bowel program 5/16- only mucus last night- will monitor to make sure has BM over weekend- he usually goes daily.  15. HLD: crestor  20mg   nightly 16. Delirium/Confusion 4/29- yesterday given IVF's for this- checked U/A and Cx- which showed mod leuks, however rate bacteria and very little WBC's 6-10- which is not a UTI in most cases- will d/w pharmacy-  -pt still pleasantly confused this AM- but didn't even open eyes, was so sleepy, so hard to tease out what is due to sleepiness and what is actual delirium- per nurse, much better last night-  4/30- pt Ox3 this AM- very with it- likely flexeril  based on chart review- given for pain since on no pain meds- will keep off Flexeril  and monitor Sx's  5/5- Sx's resolved  -11/07/23 SEE ABOVE 17. Hx of HTN with severe hypotension 4/30- pt's BP dropped to 79 systolic yesterday when sitting with OT- not clear if had TEDS/ACE wraps- will reduce Metoprolol  XL to 12.5 mg daily- from 50 mg daily and see if that helps- don't want ot add midodrine  quite yet.  5/1- Adding midodrine  5 mg TID and adding abd binder- couldn't tolerate ACE wraps at all- just TED's-   5/2- BP yesterday was less symptomatic- but still dropped some- con't regimen -5/3-4/25 BPs a bit better recently; monitor 5/6-5/11 BP doing better- con't regimen 5/12 orthostatic hypotension noted this morning, bolus IVF 500cc. 5/13- Will increase Midodrine  to 7.5 mg TID 5/14- 5/15- BP slightly better actually has 1 episode of 155 systolic so wait to increase Midodrine  5/16- BP doing better overall per chart- will monitor trend Vitals:   11/09/23 1555 11/09/23 1959 11/10/23 0520 11/10/23 0745  BP: (!) 113/56 (!) 111/56 (!) 120/52 107/60   11/10/23 1300 11/10/23 1832 11/11/23 0314 11/11/23 0805  BP: 105/82 (!) 116/51 (!) 121/57 (!) 155/69   11/11/23 1249 11/11/23 1803 11/12/23 0510 11/12/23 0813  BP: (!) 127/55 (!) 112/45 (!) 103/55 133/73     18. Atelectasis 4/30- sounds coarse- asked pt to use ICS regularly- is at bedside- also described "spitting things up"_ had "spit cup"- at bedside-  had barely anything in it. 5/5- not spitting up  anymore  19. R heel DTI and Stage I on sacrum 5/2- is new- ordered prevalon boots as well as low air loss mattress and needs to be turned q2 hours- placed order  5/8- pt not able to tolerate air mattress- turn q2 hours and prevalons for Heel DTI- d/w nursing- just removed this AM 5/12 patient asked for device to keep pressure off his heel, Prevalon boots at bedside. Will chek with nursing regarding use of these boots at night.  20. Spasticity  5/5- will d/w wife tomorrow about restarting Baclofen  vs Dantrolene.   5/6- attempted to call wife again x2- cannot get her 5/7- was able to see wife yesterday and discussed- we will retry Pt on Baclofen  5 mg TID for now- not a higher dose due to renal issues-  and monitor for confusion/delirium- if gets confused, will try Dantrolene.   5/8- no confusion and RLE much less pain- so think it's working  5/9- will add Dantrolene as above 5/14- Increased dantrolene to 50 mg BID 5/16- will increase again on 5/19- Monday 21. Severe malnutrition  5/5- per Dietitian- will do supplements 22. Mild tachycardia 5/8- per pt, has ALWAYS had tachycardia- has been worked up- and nothing found- usually 90s-100's per pt- which is the same here- will monitor 23. Ileus???  5/13- reached out to Radiology about pt's questionable ileus- not clear in Radiology report  5/14- doesn't have ileus- but full of gas- started Simethicone  QID 24. Nasal congestion with mild cough  5/15- probably due to post nasal drip- will start Flonase 2 sprays each nare daily.   5/16- says nasal congestion much better- won't need Flonase- will make prn  I spent a total of 35   minutes on total care today- >50% coordination of care- due to  D/w nursing about bowels and changed Flonase- also reviewed labs including CBGs and bowels  LOS: 25 days A FACE TO FACE EVALUATION WAS PERFORMED  Riane Rung 11/12/2023, 8:54 AM

## 2023-11-12 NOTE — Progress Notes (Signed)
 IP Rehab Bowel Program Documentation   Bowel Program Start time 1753  Dig Stim Indicated? No   Order to dig stim if no BM    Ordered intervention: Suppository Yes , mini enema No ,   Repeat dig stim after Suppository or Mini enema  X 1,  Output? Moderate (mucous)   Bowel Program Complete? Yes , handoff given   Patient Tolerated? Yes

## 2023-11-12 NOTE — Progress Notes (Signed)
 Physical Therapy Session Note  Patient Details  Name: Christian Bailey MRN: [id number] Date of Birth: 02-05-1941  Today's Date: 11/12/2023 PT Individual Time: 0800-0900 PT Individual Time Calculation (min): 60 min   Short Term Goals: Week 3:  PT Short Term Goal 1 (Week 3): Pt will perform bed mobility with mod A PT Short Term Goal 2 (Week 3): Pt will progress transfers to max A PT Short Term Goal 3 (Week 3): Pt will be able to tolerate x 5 min in standing for WB/postural control retraining/tone management  Skilled Therapeutic Interventions/Progress Updates:    Pt presents in bed stating his legs are hurting and had already called for medication. Focused on NMR during rolling for donning of pants (total for tedhose) and abdominal binder with pt initially max assist but progressing to mod assist with several repetitions to each side. Stretching of BLE for tone management including hip, knee and ankle ROM. In supine, assisted pt with BLE into hooklying position (pt able to activate but difficulty maintaining position) and performed x 10 reps of bridges for activation of core and glutes for improved overall mobility. Pt performed rolling to the R with mod assist and then max for supine to sit with heavy assist needed for trunk to complete sidelying to sit. Required +2 assist for lateral lean for slideboard placement and pt required mod assist for initial scoot but then able to perform with min assist and a few rest breaks. Once on cushion part of w/c, required mod assist to complete scooting and +2 for scooting fully back into the chair. Supervision for PWC mobility on unit > 150'. Continued to focus on NMR during slideboard transfers w/c <> mat table with focus on head/hips relationship, postural control and balance, and activation with min/mod assist for scooting and max for slideboard placement. Returned to room and all needs in reach.   Therapy Documentation Precautions:  Precautions Precautions:  Fall, Cervical Precaution/Restrictions Comments: decreased recall of precautions, not able to verbalize any when prompted Required Braces or Orthoses: Cervical Brace Cervical Brace: Soft collar Restrictions Weight Bearing Restrictions Per Provider Order: No  Pain:  Reports pain in BLE - RN aware and medication given during session. Reports some relief after stretching by PT.   Therapy/Group: Individual Therapy   Gita Lamb [name], PT, DPT, CBIS  11/12/2023, 10:48 AM

## 2023-11-12 NOTE — Group Note (Deleted)
 Patient Details Name: Christian Bailey MRN: [id number] DOB: 1940-07-02 Today's Date: 11/12/2023  Time Calculation:        Group Description: Fine motor: Pt participated in group session with a focus on R FMC, functional reach with LUE, problem solving, sit>stands, visual attention, and social interaction. Pt actively participated in group game of Bingo  where pts were instructed on rules of game; pt had opportunity to pick up small manipulatives (chips) and a marker. Pt able to sit during tasks.    Individual level documentation: tolerance to task participation level overall assist level to complete task pain indicators  Pain:    Precautions:     Christian Bailey [facility] 11/12/2023, 12:54 PM

## 2023-11-12 NOTE — Progress Notes (Signed)
 Occupational Therapy Session Note  Patient Details  Name: Christian Bailey MRN: [id number] Date of Birth: [birth year]  Today's Date: 11/12/2023 OT Individual Time: 0933-1000 OT Individual Time Calculation (min): 27 min    Short Term Goals: Week 3:  OT Short Term Goal 1 (Week 3): Pt will complete dynamic sitting with SBA in preparation for ADL tasks OT Short Term Goal 1 - Progress (Week 3): Progressing toward goal OT Short Term Goal 2 (Week 3): Pt will complete bed mobility with Mod A in preparation for ADLs OT Short Term Goal 2 - Progress (Week 3): Not met OT Short Term Goal 3 (Week 3): Pt will complete UB dressing at Min A consistently throughout sessions OT Short Term Goal 3 - Progress (Week 3): Progressing toward goal Week 4:  OT Short Term Goal 1 (Week 4): Pt will complete UB dressing at Min A consistently throughout sessions OT Short Term Goal 2 (Week 4): Pt will complete dynamic sitting with SBA in preparation for ADL tasks OT Short Term Goal 3 (Week 4): Pt will complete grooming at SBA seated in W/C   Skilled Therapeutic Interventions/Progress Updates:    Pt up in power chair at time of session, no pain reported. Focus of session on power chair mobility > sink level driving with MIN A to manage chair in small space, set up with toothbrush/paste and educated on techniques to improve ability to perform sink level tasks, Supervision overall. Wheelchair mobility in a tight space for turning 90*, 180*, and backing into small spaces withpt hitting objects x2. Set up reclined, call bell in reach all needs met.  Therapy Documentation Precautions:  Precautions Precautions: Fall, Cervical Precaution/Restrictions Comments: decreased recall of precautions, not able to verbalize any when prompted Required Braces or Orthoses: Cervical Brace Cervical Brace: Soft collar Restrictions Weight Bearing Restrictions Per Provider Order: No     Therapy/Group: Individual Therapy  Doroteo Gasmen 11/12/2023, 10:28 AM

## 2023-11-12 NOTE — Plan of Care (Signed)
  Problem: Consults Goal: RH GENERAL PATIENT EDUCATION Description: See Patient Education module for education specifics. Outcome: Progressing   Problem: RH BOWEL ELIMINATION Goal: RH STG MANAGE BOWEL WITH ASSISTANCE Description: STG Manage Bowel with mod I  Assistance. Outcome: Progressing Goal: RH STG MANAGE BOWEL W/MEDICATION W/ASSISTANCE Description: STG Manage Bowel with Medication with mod I Assistance. Outcome: Progressing   Problem: RH BLADDER ELIMINATION Goal: RH STG MANAGE BLADDER WITH ASSISTANCE Description: STG Manage Bladder With toileting / mod I Assistance Outcome: Progressing Goal: RH STG MANAGE BLADDER WITH MEDICATION WITH ASSISTANCE Description: STG Manage Bladder With Medication With mod I Assistance. Outcome: Progressing   Problem: RH SAFETY Goal: RH STG ADHERE TO SAFETY PRECAUTIONS W/ASSISTANCE/DEVICE Description: STG Adhere to Safety Precautions With cues Assistance/Device. Outcome: Progressing   Problem: RH PAIN MANAGEMENT Goal: RH STG PAIN MANAGED AT OR BELOW PT'S PAIN GOAL Description: < 4 with prns Outcome: Progressing   Problem: RH KNOWLEDGE DEFICIT GENERAL Goal: RH STG INCREASE KNOWLEDGE OF SELF CARE AFTER HOSPITALIZATION Description: Patient and wife will be able to manage care at discharge using educational resources for medication, skin care,dietary modification and fall prevention independently Outcome: Progressing   Problem: Education: Goal: Ability to describe self-care measures that may prevent or decrease complications (Diabetes Survival Skills Education) will improve Outcome: Progressing Goal: Individualized Educational Video(s) Outcome: Progressing   Problem: Coping: Goal: Ability to adjust to condition or change in health will improve Outcome: Progressing   Problem: Fluid Volume: Goal: Ability to maintain a balanced intake and output will improve Outcome: Progressing   Problem: Health Behavior/Discharge Planning: Goal: Ability  to identify and utilize available resources and services will improve Outcome: Progressing Goal: Ability to manage health-related needs will improve Outcome: Progressing   Problem: Metabolic: Goal: Ability to maintain appropriate glucose levels will improve Outcome: Progressing   Problem: Nutritional: Goal: Maintenance of adequate nutrition will improve Outcome: Progressing Goal: Progress toward achieving an optimal weight will improve Outcome: Progressing   Problem: Skin Integrity: Goal: Risk for impaired skin integrity will decrease Outcome: Progressing   Problem: Tissue Perfusion: Goal: Adequacy of tissue perfusion will improve Outcome: Progressing

## 2023-11-13 LAB — GLUCOSE, CAPILLARY
Glucose-Capillary: 124 mg/dL — ABNORMAL HIGH (ref 70–99)
Glucose-Capillary: 187 mg/dL — ABNORMAL HIGH (ref 70–99)
Glucose-Capillary: 182 mg/dL — ABNORMAL HIGH (ref 70–99)
Glucose-Capillary: 198 mg/dL — ABNORMAL HIGH (ref 70–99)

## 2023-11-13 MED ORDER — SORBITOL 70 % SOLN
30.0000 mL | Freq: Once | Status: AC
  Administered 2023-11-13: 30 mL via ORAL
  Filled 2023-11-13: qty 30

## 2023-11-13 NOTE — Plan of Care (Incomplete)
  Problem: Consults Goal: RH GENERAL PATIENT EDUCATION Description: See Patient Education module for education specifics. Outcome: Progressing   Problem: RH BOWEL ELIMINATION Goal: RH STG MANAGE BOWEL WITH ASSISTANCE Description: STG Manage Bowel with mod I  Assistance. Outcome: Progressing Goal: RH STG MANAGE BOWEL W/MEDICATION W/ASSISTANCE Description: STG Manage Bowel with Medication with mod I Assistance. Outcome: Progressing   Problem: RH BLADDER ELIMINATION Goal: RH STG MANAGE BLADDER WITH ASSISTANCE Description: STG Manage Bladder With toileting / mod I Assistance Outcome: Progressing Goal: RH STG MANAGE BLADDER WITH MEDICATION WITH ASSISTANCE Description: STG Manage Bladder With Medication With mod I Assistance. Outcome: Progressing   Problem: RH SAFETY Goal: RH STG ADHERE TO SAFETY PRECAUTIONS W/ASSISTANCE/DEVICE Description: STG Adhere to Safety Precautions With cues Assistance/Device. Outcome: Progressing   Problem: RH PAIN MANAGEMENT Goal: RH STG PAIN MANAGED AT OR BELOW PT'S PAIN GOAL Description: < 4 with prns Outcome: Progressing   Problem: RH KNOWLEDGE DEFICIT GENERAL Goal: RH STG INCREASE KNOWLEDGE OF SELF CARE AFTER HOSPITALIZATION Description: Patient and wife will be able to manage care at discharge using educational resources for medication, skin care,dietary modification and fall prevention independently Outcome: Progressing   Problem: Education: Goal: Ability to describe self-care measures that may prevent or decrease complications (Diabetes Survival Skills Education) will improve Outcome: Progressing Goal: Individualized Educational Video(s) Outcome: Progressing   Problem: Coping: Goal: Ability to adjust to condition or change in health will improve Outcome: Progressing   Problem: Fluid Volume: Goal: Ability to maintain a balanced intake and output will improve Outcome: Progressing   Problem: Health Behavior/Discharge Planning: Goal: Ability  to identify and utilize available resources and services will improve Outcome: Progressing Goal: Ability to manage health-related needs will improve Outcome: Progressing   Problem: Metabolic: Goal: Ability to maintain appropriate glucose levels will improve Outcome: Progressing   Problem: Nutritional: Goal: Maintenance of adequate nutrition will improve Outcome: Progressing Goal: Progress toward achieving an optimal weight will improve Outcome: Progressing   Problem: Skin Integrity: Goal: Risk for impaired skin integrity will decrease Outcome: Progressing   Problem: Tissue Perfusion: Goal: Adequacy of tissue perfusion will improve Outcome: Progressing

## 2023-11-13 NOTE — Progress Notes (Signed)
 PROGRESS NOTE   Subjective/Complaints:  Pt doing ok, slept well, pain well managed, LBM 2 days ago and mostly just mucus but didn't get sorbitol  yesterday. Agreeable with trying that today. Foley doing fine, no other issues or concerns.    ROS:   Pt denies SOB, abd pain, CP, N/V/D, and vision changes   + Heel discomfort- on R side- less so + nausea/vomiting- -hasn't recurred   Objective:   No results found.    No results for input(s): "WBC", "HGB", "HCT", "PLT" in the last 72 hours.     Recent Labs    11/11/23 0541  NA 137  K 4.0  CL 100  CO2 28  GLUCOSE 154*  BUN 30*  CREATININE 1.68*  CALCIUM  8.5*       Intake/Output Summary (Last 24 hours) at 11/13/2023 1300 Last data filed at 11/13/2023 0700 Gross per 24 hour  Intake 120 ml  Output 850 ml  Net -730 ml     Pressure Injury 10/28/23 Sacrum Medial Stage 1 -  Intact skin with non-blanchable redness of a localized area usually over a bony prominence. STAGE 1 ON SACRUM (Active)  10/28/23 1333  Location: Sacrum  Location Orientation: Medial  Staging: Stage 1 -  Intact skin with non-blanchable redness of a localized area usually over a bony prominence.  Wound Description (Comments): STAGE 1 ON SACRUM  Present on Admission: No     Pressure Injury 10/28/23 Heel Right Deep Tissue Pressure Injury - Purple or maroon localized area of discolored intact skin or blood-filled blister due to damage of underlying soft tissue from pressure and/or shear. DTI on bottom of heel (Active)  10/28/23 1820  Location: Heel  Location Orientation: Right  Staging: Deep Tissue Pressure Injury - Purple or maroon localized area of discolored intact skin or blood-filled blister due to damage of underlying soft tissue from pressure and/or shear.  Wound Description (Comments): DTI on bottom of heel  Present on Admission: No    Physical Exam: Vital Signs Blood pressure (!)  106/57, pulse (!) 101, temperature 98 F (36.7 C), resp. rate 17, height 5\' 4"  (1.626 m), weight 60.6 kg, SpO2 95%.    General: awake, alert, appropriate, resting in bed; NAD HENT: conjugate gaze; oropharynx moist CV: regular to borderline tachycardic  rate and rhythm; no JVD Pulmonary: CTA B/L; no W/R/R- good air movement GI: soft, NT, ND, (+)BS Psychiatric: appropriate- cheerful this morning GU- light yellow amber urine-no change Skin: Heels covered with foam dressings B/L- R heel DTI and Sacral Stage I-- not reassessed 5/17 Wearing prevalons B/L   Prior exam MSK: Ue's 4/5 B/L throughout LE's- 2-/5 B/L-in HF/KE- 4-/5 DF/PF but RLE weaker than LLE Spasticity- MAS of 1+ to 2 in Ue's this Am and 3 in RLE- MAS worse this AM- 2-3 throughout Neurologic: Cranial nerves II through XII intact, motor strength is 4/5 in bilateral  bicep, tricep, grip,and finger ext and hand intrinsics, 3- RIght delt and 4/5 left delt, 3- bilateral  hip flexor, 3- RIght and 4- left knee extensors, 4/5 bilateral ankle dorsiflexor and plantar flexor Sensory exam normal sensation to light touch and proprioception in bilateral upper and lower extremities Tone-  3+ a bilateral biceps triceps and BR, + Hoffman's bilateral 2/5 RIght 3+ left knee Also MAS 3 tone in RIght Knee flexors and extensors, MAS 1 at the left knee Clonus at bilateral ankles  Musculoskeletal: Full range of motion in all 4 extremities. No joint swelling      Assessment/Plan: 1. Functional deficits which require 3+ hours per day of interdisciplinary therapy in a comprehensive inpatient rehab setting. Physiatrist is providing close team supervision and 24 hour management of active medical problems listed below. Physiatrist and rehab team continue to assess barriers to discharge/monitor patient progress toward functional and medical goals  Care Tool:  Bathing              Bathing assist Assist Level: Total Assistance - Patient < 25%      Upper Body Dressing/Undressing Upper body dressing   What is the patient wearing?: Pull over shirt    Upper body assist Assist Level: Moderate Assistance - Patient 50 - 74%    Lower Body Dressing/Undressing Lower body dressing      What is the patient wearing?: Underwear/pull up, Pants     Lower body assist Assist for lower body dressing: Dependent - Patient 0%     Toileting Toileting    Toileting assist Assist for toileting: Dependent - Patient 0%     Transfers Chair/bed transfer  Transfers assist     Chair/bed transfer assist level: 2 Helpers (min/mod A to scoot, +2 needed for lateral lean for slideboard)     Locomotion Ambulation   Ambulation assist   Ambulation activity did not occur: Safety/medical concerns  Assist level: 2 helpers Assistive device: [name] Max distance: 10'   Walk 10 feet activity   Assist  Walk 10 feet activity did not occur: Safety/medical concerns  Assist level: 2 helpers Assistive device: Walker-Eva   Walk 50 feet activity   Assist Walk 50 feet with 2 turns activity did not occur: Safety/medical concerns         Walk 150 feet activity   Assist Walk 150 feet activity did not occur: Safety/medical concerns         Walk 10 feet on uneven surface  activity   Assist Walk 10 feet on uneven surfaces activity did not occur: Safety/medical concerns         Wheelchair     Assist Is the patient using a wheelchair?: Yes Type of Wheelchair: Power    Wheelchair assist level: Supervision/Verbal cueing Max wheelchair distance: 200    Wheelchair 50 feet with 2 turns activity    Assist        Assist Level: Supervision/Verbal cueing   Wheelchair 150 feet activity     Assist      Assist Level: Supervision/Verbal cueing   Blood pressure (!) 106/57, pulse (!) 101, temperature 98 F (36.7 C), resp. rate 17, height 5\' 4"  (1.626 m), weight 60.6 kg, SpO2 95%.  Medical Problem List and  Plan: 1. 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  Antithrombotics: -DVT/anticoagulation:  Mechanical: Sequential compression devices, below knee Bilateral lower extremities  4/23- will see if Dr Ellery Guthrie will allow us  to start Lovenox - he said yes, so will start. 30mg  daily. 5/14- when leaves, will go on Eliquis for a total of 3 months 2.5 mg BID             -antiplatelet therapy: N/A 3. Pain Management: tylenol  prn. Voltaren  QID, lyrica  50mg  BID, flexeril  PRN -10/24/23 leg muscle soreness likely from PT/OT this week; will start muscle rub cream 4/28- still c/o muscle soreness in legs- con't pain regimen as needed 4/30- still sore, but don't want Flexeril  since likely caused confusion- con't tylenol  prn- can use muscle rub which is ordered 5/1- denies a lot of pain- mainly sore form spasticity/working out 5/5- pain yesterday- not today-  5/7- will add Tramadol  50 mg q8 hours prn if pain won't go away otherwise-  due to renal function- doing TID prn- hopefully baclofen  helps pain in RLE as well 5/8- pain better since started baclofen - didn't need tramadol  5/9- will add Dantrolene  50 mg at bedtime and see if tolerates- if so, will con't to increase the dose to help spasticity and reduce chance of sedation/side effects  -11/07/23 very delirious yesterday per pt, will stop baclofen  and leave Dantrolene  for now; monitor -5/12 continue current regimen, mentation sounds like it is better today 5/13- Dantrolene  not making confused so far- will increase after 5 days- added baclofen  and Flexeril  to allergy list 5/14- Doing better with tylenol  and muscle rub 4. Mood/Behavior/Sleep: LCSW to follow up for  evaluation and support when appropriate --Delirium precautions--His mental status has cleared but will monitor for worsening with transfer to new unit .pt has not required prn meds for agitation in several days  Monitor sleep wake cycle             -antipsychotic agents: Change haldol  q 6 hrs prn to Seroquel  12.5mg  tid prn  -Restoril  7.5mg  nightly, cymbalta  20mg  daily 4/23-4/25 no delirium so far 4/28- having some delirium this AM? Not severe, but will monitor since has had in recent past 4/29- still confused this AM- but per nursing , was less confused overnight after Restoril  stopped- still pleasantly confused this AM when first woke up- didn't open eyes.  5/9 Aox3 this AM -11/07/23 delirious yesterday, stopped baclofen  as above. 5/14- resolved  5. Neuropsych/cognition: This patient is usually  capable of making decisions on his own behalf. 6. Skin/Wound Care: Routine pressure relief measures. 7. Fluids/Electrolytes/Nutrition:  Monitor I/O. Routine labs. Continue vitamins/supplements. 8. 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 Willye Harvey, per note, leave in place for 7-10d, Consider resuming In-N-Out catheterization for management of his neurogenic bladder at that time. Recommend using a coud catheter for intermittent catheterization (when resumed).  4/28- will leave foley for 7-10 days 10/31/23 foley likely ready to come out tomorrow? Placed 4/25 5/5- will d/c foley in AM after speaking with pt 5/6- pt wants ot do what wife wants- cannot reach wife to discuss foley removal- pt doesn't want to do cathing and sounds like would rather keep foley- but will discuss with him  further 5/7- pt and wife have decided they don't want to remove foley - they don't want ot go back to in/out caths-  since  wasn't emptying before- they want a chronic foley- when offered to remove foley just for rehab, to see if he gets return, he is scared will get bleeding again, due to false passage already had and declined 5/12 Foley draining clear yellow urine 5/16- wife still wants and if going to SNF, will need to keep 10.  Diabetes- A1c 8.0-  with peripheral neuropathy by hx only on Jardiance at home, use SSI in the hospital  -10/23/23 CBGs variable but likely d/t steroids; cont SSI for now 4/29- CBG's very labile in last 24 hours 99-383- - it concerns me to start Jardiance since of Foley and risk of UTI's- also for starting Tradjenta  since BG was 99 last night- don't want to drop BG too much 4/30- Will add Tradjenta  since BG's still running 150-242- will monitor closely for CBG hypoglycemia-   10/30/23 CBGs looking great!  -10/31/23 CBGs a little higher the last day, but monitor for trend  5/5- Cbgs all less than 200- con't to monitor  5/6- CBGs 130s- 180s- 5/7-5/9 CBG's 128 to 220- had snack yesterday- still snacking in afternoon -5/10-11/25 CBGs great except for afternoon d/t snacks. Leave meds alone for now -5/12 fair glucose control, continue current regimen and monitor. 5/13- 5/15- great control except for afternoon- snacking- will d/w pt 5/16- Cbgs jumping higher- if this continues to occur, will need other meds for CBG's -11/13/23 CBGs so variable but mostly it's the afternoon/evening values; will start carb modified diet as he's on regular diet right now. If not, might need to add other med? CBG (last 3)  Recent Labs    11/12/23 2051 11/13/23 0600 11/13/23 1143  GLUCAP 261* 124* 187*     11.  Spasticity affecting LEs> UE and R>L , aggressive ROM  with PT, consider oral meds  4/22- starting baclofen  5 mg TID- with his Cr 1.50- has been running 1.68 to 2.30  4/24- will not  increase baclofen  due to Renal issues 5/9- will add Dantrolene  50 mg at bedtime to reduce chance of spasticity but also reduce chance of side effects/sedation- - will monitor -11/07/23 stopped baclofen  d/t delirium, cont dantrolene  -5/12 continue dantrolene , continue hold baclofen  5/13- will increase Dantrolene  after 5 days 5/14- increase Dantrolene  to 50 mg BID 5/15- no side effects so far 12.  PAD s/p SFA and Iliac stenting, continue metoprolol  50mg  daily  13. CKD3b? With AKI -4/22- Pt's Cr down to 1.50 from 2.29 2 days ago and 1.68 yesterday- will monitor 2x/week to see if will reduce further or be labile.   4/24- Cr 1.84- and BUN stable at 55- is drinking per pt  4/28- Cr 1.63- doing better 4/29- Cr 1.59 after 12+ hours of IVF's- and BUN very slightly down to 70 from 72-   5/1- Cr down to 1.43 and BUN down to 53  5/5- Cr 1.51 and BUN down to 37- drinking more!  5/8- Cr 1.8- up from 1.51- but BUN 34 down from 37- will push fluids  5/12 creatinine and BUN much lower at 1.33/26-improved  5/15- Cr 1.68 and BUN 30 this AM- BUN improving- but Cr bounces 14. Neurogenic bowel: continue miralax  32g daily, senokot s 2 tabs qAM  5/1- LBM this Am- was accident- might need bowel program 5/2- Placed on suppository nightly with possible dig stim- had multiple BM's during/after bowel program- if continues like this, will need dig stim.   5/6- BM last evening- no bowel note for last few nights seen 5/7- LBM 2 nights ago-  no bowel note- d/w nursing coordinator 11/07/23 LBM last night 5/12 LBM night before last.  Patient reports he was not given his suppository last night-appears documented. KUB and LFTS ordered 5/13- LFTs look good- having nightly BM's with bowel program- need to teach wife to do 5/14- pt going to SNF- wife doesn't want to learn bowel program 5/15- good results- with bowel program 5/16- only mucus last night- will monitor to make sure has BM over weekend- he usually goes daily.   -11/13/23 no BM since 5/15, sorbitol  30ml once today; monitor 15. HLD: crestor  20mg  nightly 16. Delirium/Confusion 4/29- yesterday given IVF's for this- checked U/A and Cx- which showed mod leuks, however rate bacteria and very little WBC's 6-10- which is not a UTI in most cases- will d/w pharmacy-  -pt still pleasantly confused this AM- but didn't even open eyes, was so sleepy, so hard to tease out what is due to sleepiness and what is actual delirium- per nurse, much better last night-  4/30- pt Ox3 this AM- very with it- likely flexeril  based on chart review- given for pain since on no pain meds- will keep off Flexeril  and monitor Sx's  5/5- Sx's resolved  -11/07/23 SEE ABOVE 17. Hx of HTN with severe hypotension 4/30- pt's BP dropped to 79 systolic yesterday when sitting with OT- not clear if had TEDS/ACE wraps- will reduce Metoprolol  XL to 12.5 mg daily- from 50 mg daily and see if that helps- don't want ot add midodrine  quite yet.  5/1- Adding midodrine  5 mg TID and adding abd binder- couldn't tolerate ACE wraps at all- just TED's-   5/2- BP yesterday was less symptomatic- but still dropped some- con't regimen -5/3-4/25 BPs a bit better recently; monitor 5/6-5/11 BP doing better- con't regimen 5/12 orthostatic hypotension noted this morning, bolus IVF 500cc. 5/13- Will increase Midodrine  to 7.5 mg TID 5/14- 5/15- BP slightly better actually has 1 episode of 155 systolic so wait to increase Midodrine  5/16-17- BP doing better overall per chart- will monitor trend Vitals:   11/10/23 0520 11/10/23 0745 11/10/23 1300 11/10/23 1832  BP: (!) 120/52 107/60 105/82 (!) 116/51   11/11/23 0314 11/11/23 0805 11/11/23 1249 11/11/23 1803  BP: (!) 121/57 (!) 155/69 (!) 127/55 (!) 112/45   11/12/23 0510 11/12/23 0813 11/12/23 2050 11/13/23 0559  BP: (!) 103/55 133/73 (!) 117/50 (!) 106/57     18. Atelectasis 4/30- sounds coarse- asked pt to use ICS regularly- is at bedside- also described "spitting  things up"_ had "spit cup"- at bedside-  had barely anything in it. 5/5- not spitting up anymore  19. R heel DTI and Stage I on sacrum 5/2- is new- ordered prevalon boots as well as low air loss mattress and needs to be turned q2 hours- placed order  5/8- pt not able to tolerate air mattress- turn q2 hours and prevalons for Heel DTI- d/w nursing- just removed this AM 5/12 patient asked for device to keep pressure off his heel, Prevalon boots at bedside. Will chek with nursing regarding use of these boots at night.  20. Spasticity  5/5- will d/w wife tomorrow about restarting Baclofen  vs Dantrolene .   5/6- attempted to call wife again x2- cannot get her 5/7- was able to see wife yesterday and discussed- we will retry Pt on Baclofen  5 mg TID for now- not a higher dose due to renal issues-  and monitor for confusion/delirium- if gets confused, will try Dantrolene .   5/8- no confusion and RLE much  less pain- so think it's working 5/9- will add Dantrolene  as above 5/14- Increased dantrolene  to 50 mg BID 5/16- will increase again on 5/19- Monday 21. Severe malnutrition  5/5- per Dietitian- will do supplements 22. Mild tachycardia 5/8- per pt, has ALWAYS had tachycardia- has been worked up- and nothing found- usually 90s-100's per pt- which is the same here- will monitor 23. Ileus??? 5/13- reached out to Radiology about pt's questionable ileus- not clear in Radiology report  5/14- doesn't have ileus- but full of gas- started Simethicone  QID 24. Nasal congestion with mild cough 5/15- probably due to post nasal drip- will start Flonase  2 sprays each nare daily.   5/16- says nasal congestion much better- won't need Flonase - will make prn    LOS: 26 days A FACE TO FACE EVALUATION WAS PERFORMED  [address] 11/13/2023, 1:00 PM

## 2023-11-14 LAB — GLUCOSE, CAPILLARY
Glucose-Capillary: 183 mg/dL — ABNORMAL HIGH (ref 70–99)
Glucose-Capillary: 189 mg/dL — ABNORMAL HIGH (ref 70–99)
Glucose-Capillary: 185 mg/dL — ABNORMAL HIGH (ref 70–99)
Glucose-Capillary: 199 mg/dL — ABNORMAL HIGH (ref 70–99)

## 2023-11-14 NOTE — Plan of Care (Signed)
  Problem: Consults Goal: RH GENERAL PATIENT EDUCATION Description: See Patient Education module for education specifics. Outcome: Progressing   Problem: RH BOWEL ELIMINATION Goal: RH STG MANAGE BOWEL WITH ASSISTANCE Description: STG Manage Bowel with mod I  Assistance. Outcome: Progressing Goal: RH STG MANAGE BOWEL W/MEDICATION W/ASSISTANCE Description: STG Manage Bowel with Medication with mod I Assistance. Outcome: Progressing   Problem: RH BLADDER ELIMINATION Goal: RH STG MANAGE BLADDER WITH ASSISTANCE Description: STG Manage Bladder With toileting / mod I Assistance Outcome: Progressing Goal: RH STG MANAGE BLADDER WITH MEDICATION WITH ASSISTANCE Description: STG Manage Bladder With Medication With mod I Assistance. Outcome: Progressing   Problem: RH SAFETY Goal: RH STG ADHERE TO SAFETY PRECAUTIONS W/ASSISTANCE/DEVICE Description: STG Adhere to Safety Precautions With cues Assistance/Device. Outcome: Progressing   Problem: RH PAIN MANAGEMENT Goal: RH STG PAIN MANAGED AT OR BELOW PT'S PAIN GOAL Description: < 4 with prns Outcome: Progressing   Problem: RH KNOWLEDGE DEFICIT GENERAL Goal: RH STG INCREASE KNOWLEDGE OF SELF CARE AFTER HOSPITALIZATION Description: Patient and wife will be able to manage care at discharge using educational resources for medication, skin care,dietary modification and fall prevention independently Outcome: Progressing   Problem: Education: Goal: Ability to describe self-care measures that may prevent or decrease complications (Diabetes Survival Skills Education) will improve Outcome: Progressing Goal: Individualized Educational Video(s) Outcome: Progressing   Problem: Coping: Goal: Ability to adjust to condition or change in health will improve Outcome: Progressing   Problem: Fluid Volume: Goal: Ability to maintain a balanced intake and output will improve Outcome: Progressing   Problem: Health Behavior/Discharge Planning: Goal: Ability  to identify and utilize available resources and services will improve Outcome: Progressing Goal: Ability to manage health-related needs will improve Outcome: Progressing   Problem: Metabolic: Goal: Ability to maintain appropriate glucose levels will improve Outcome: Progressing   Problem: Nutritional: Goal: Maintenance of adequate nutrition will improve Outcome: Progressing Goal: Progress toward achieving an optimal weight will improve Outcome: Progressing   Problem: Skin Integrity: Goal: Risk for impaired skin integrity will decrease Outcome: Progressing   Problem: Tissue Perfusion: Goal: Adequacy of tissue perfusion will improve Outcome: Progressing

## 2023-11-14 NOTE — Progress Notes (Signed)
 IP Rehab Bowel Program Documentation   Bowel Program Start time [id number]  Dig Stim Indicated? Yes  Dig Stim Prior to Suppository or mini Enema X 1   Output from dig stim: Large  Ordered intervention: Suppository Yes , mini enema No ,   Repeat dig stim after Suppository or Mini enema  X 1,  Output? Minimal   Bowel Program Complete? Yes   Patient Tolerated? Yes

## 2023-11-14 NOTE — Progress Notes (Signed)
 PROGRESS NOTE   Subjective/Complaints:  Pt doing well today, slept well, denies pain today, LBM last night. Agreeable with trying that today. Foley doing fine, no other issues or concerns.    ROS:   Pt denies SOB, abd pain, CP, N/V/D, and vision changes   + Heel discomfort- on R side- less so + nausea/vomiting- -hasn't recurred   Objective:   No results found.    No results for input(s): "WBC", "HGB", "HCT", "PLT" in the last 72 hours.     No results for input(s): "NA", "K", "CL", "CO2", "GLUCOSE", "BUN", "CREATININE", "CALCIUM " in the last 72 hours.      Intake/Output Summary (Last 24 hours) at 11/14/2023 1016 Last data filed at 11/14/2023 0416 Gross per 24 hour  Intake 527 ml  Output 650 ml  Net -123 ml     Pressure Injury 10/28/23 Sacrum Medial Stage 1 -  Intact skin with non-blanchable redness of a localized area usually over a bony prominence. STAGE 1 ON SACRUM (Active)  10/28/23 1333  Location: Sacrum  Location Orientation: Medial  Staging: Stage 1 -  Intact skin with non-blanchable redness of a localized area usually over a bony prominence.  Wound Description (Comments): STAGE 1 ON SACRUM  Present on Admission: No     Pressure Injury 10/28/23 Heel Right Deep Tissue Pressure Injury - Purple or maroon localized area of discolored intact skin or blood-filled blister due to damage of underlying soft tissue from pressure and/or shear. DTI on bottom of heel (Active)  10/28/23 1820  Location: Heel  Location Orientation: Right  Staging: Deep Tissue Pressure Injury - Purple or maroon localized area of discolored intact skin or blood-filled blister due to damage of underlying soft tissue from pressure and/or shear.  Wound Description (Comments): DTI on bottom of heel  Present on Admission: No    Physical Exam: Vital Signs Blood pressure 126/65, pulse 63, temperature 98.8 F (37.1 C), temperature source  Oral, resp. rate 17, height 5\' 4"  (1.626 m), weight 60.6 kg, SpO2 98%.    General: awake, alert, appropriate, resting in bed; NAD HENT: conjugate gaze; oropharynx moist CV: regular rate and rhythm; no JVD Pulmonary: CTA B/L; no W/R/R- good air movement GI: soft, NT, ND, (+)BS Psychiatric: appropriate- cheerful this morning GU- light yellow amber urine-no change Skin: Heels covered with foam dressings B/L- R heel DTI and Sacral Stage I-- not reassessed 5/18 Wearing prevalons B/L   Prior exam MSK: Ue's 4/5 B/L throughout LE's- 2-/5 B/L-in HF/KE- 4-/5 DF/PF but RLE weaker than LLE Spasticity- MAS of 1+ to 2 in Ue's this Am and 3 in RLE- MAS worse this AM- 2-3 throughout Neurologic: Cranial nerves II through XII intact, motor strength is 4/5 in bilateral  bicep, tricep, grip,and finger ext and hand intrinsics, 3- RIght delt and 4/5 left delt, 3- bilateral  hip flexor, 3- RIght and 4- left knee extensors, 4/5 bilateral ankle dorsiflexor and plantar flexor Sensory exam normal sensation to light touch and proprioception in bilateral upper and lower extremities Tone- 3+ a bilateral biceps triceps and BR, + Hoffman's bilateral 2/5 RIght 3+ left knee Also MAS 3 tone in RIght Knee flexors and extensors, MAS 1  at the left knee Clonus at bilateral ankles  Musculoskeletal: Full range of motion in all 4 extremities. No joint swelling      Assessment/Plan: 1. Functional deficits which require 3+ hours per day of interdisciplinary therapy in a comprehensive inpatient rehab setting. Physiatrist is providing close team supervision and 24 hour management of active medical problems listed below. Physiatrist and rehab team continue to assess barriers to discharge/monitor patient progress toward functional and medical goals  Care Tool:  Bathing              Bathing assist Assist Level: Total Assistance - Patient < 25%     Upper Body Dressing/Undressing Upper body dressing   What is the  patient wearing?: Pull over shirt    Upper body assist Assist Level: Moderate Assistance - Patient 50 - 74%    Lower Body Dressing/Undressing Lower body dressing      What is the patient wearing?: Underwear/pull up, Pants     Lower body assist Assist for lower body dressing: Dependent - Patient 0%     Toileting Toileting    Toileting assist Assist for toileting: Dependent - Patient 0%     Transfers Chair/bed transfer  Transfers assist     Chair/bed transfer assist level: 2 Helpers (min/mod A to scoot, +2 needed for lateral lean for slideboard)     Locomotion Ambulation   Ambulation assist   Ambulation activity did not occur: Safety/medical concerns  Assist level: 2 helpers Assistive device: [name] Max distance: 10'   Walk 10 feet activity   Assist  Walk 10 feet activity did not occur: Safety/medical concerns  Assist level: 2 helpers Assistive device: Walker-Eva   Walk 50 feet activity   Assist Walk 50 feet with 2 turns activity did not occur: Safety/medical concerns         Walk 150 feet activity   Assist Walk 150 feet activity did not occur: Safety/medical concerns         Walk 10 feet on uneven surface  activity   Assist Walk 10 feet on uneven surfaces activity did not occur: Safety/medical concerns         Wheelchair     Assist Is the patient using a wheelchair?: Yes Type of Wheelchair: Power    Wheelchair assist level: Supervision/Verbal cueing Max wheelchair distance: 200    Wheelchair 50 feet with 2 turns activity    Assist        Assist Level: Supervision/Verbal cueing   Wheelchair 150 feet activity     Assist      Assist Level: Supervision/Verbal cueing   Blood pressure 126/65, pulse 63, temperature 98.8 F (37.1 C), temperature source Oral, resp. rate 17, height 5\' 4"  (1.626 m), weight 60.6 kg, SpO2 98%.  Medical Problem List and Plan: 1. Functional deficits secondary to Traumatic  incomplete quadriplegia s/p C5-7 ACDF 10/12/23,  ASIA C/D- has R>L lower ext weakness as primary neurologic deficits             -patient may  shower             -ELOS/Goals: Sup 14-16d  Pt not able to tolerate air mattress- very painful- asked ot come off it  D/c to SNF planned  Con't CIR PT and OT 2.  Antithrombotics: -DVT/anticoagulation:  Mechanical: Sequential compression devices, below knee Bilateral lower extremities 4/23- will see if Dr Ellery Guthrie will allow us  to start Lovenox - he said yes, so will start. 30mg  daily. 5/14- when leaves, will go on  Eliquis for a total of 3 months 2.5 mg BID             -antiplatelet therapy: N/A 3. Pain Management: tylenol  prn. Voltaren  QID, lyrica  50mg  BID, flexeril  PRN -10/24/23 leg muscle soreness likely from PT/OT this week; will start muscle rub cream 4/28- still c/o muscle soreness in legs- con't pain regimen as needed 4/30- still sore, but don't want Flexeril  since likely caused confusion- con't tylenol  prn- can use muscle rub which is ordered 5/1- denies a lot of pain- mainly sore form spasticity/working out 5/5- pain yesterday- not today-  5/7- will add Tramadol  50 mg q8 hours prn if pain won't go away otherwise-  due to renal function- doing TID prn- hopefully baclofen  helps pain in RLE as well 5/8- pain better since started baclofen - didn't need tramadol  5/9- will add Dantrolene  50 mg at bedtime and see if tolerates- if so, will con't to increase the dose to help spasticity and reduce chance of sedation/side effects  -11/07/23 very delirious yesterday per pt, will stop baclofen  and leave Dantrolene  for now; monitor -5/12 continue current regimen, mentation sounds like it is better today 5/13- Dantrolene  not making confused so far- will increase after 5 days- added baclofen  and Flexeril  to allergy list 5/14- Doing better with tylenol  and muscle rub 4. Mood/Behavior/Sleep: LCSW to follow up for evaluation and support when appropriate --Delirium  precautions--His mental status has cleared but will monitor for worsening with transfer to new unit .pt has not required prn meds for agitation in several days  Monitor sleep wake cycle             -antipsychotic agents: Change haldol  q 6 hrs prn to Seroquel  12.5mg  tid prn  -Restoril  7.5mg  nightly, cymbalta  20mg  daily 4/23-4/25 no delirium so far 4/28- having some delirium this AM? Not severe, but will monitor since has had in recent past 4/29- still confused this AM- but per nursing , was less confused overnight after Restoril  stopped- still pleasantly confused this AM when first woke up- didn't open eyes.  5/9 Aox3 this AM -11/07/23 delirious yesterday, stopped baclofen  as above. 5/14- resolved  5. Neuropsych/cognition: This patient is usually  capable of making decisions on his own behalf. 6. Skin/Wound Care: Routine pressure relief measures. 7. Fluids/Electrolytes/Nutrition:  Monitor I/O. Routine labs. Continue vitamins/supplements. 8. 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 Willye Harvey, per note, leave in place for 7-10d, Consider resuming In-N-Out catheterization for management of his neurogenic bladder at that time. Recommend using a coud catheter for intermittent catheterization (when resumed).  4/28- will leave foley for 7-10 days 10/31/23 foley likely ready to come out tomorrow? Placed 4/25 5/5- will d/c foley in AM after speaking with pt 5/6- pt wants ot do what wife wants- cannot reach wife to discuss foley removal- pt doesn't want to do cathing and sounds like would rather keep foley- but will discuss with him further 5/7- pt and wife have decided they don't want to  remove foley - they don't want ot go back to in/out caths-  since wasn't emptying before- they want a chronic foley- when offered to remove foley just for rehab, to see if he gets return, he is scared will  get bleeding again, due to false passage already had and declined 5/12 Foley draining clear yellow urine 5/16- wife still wants and if going to SNF, will need to keep 10.  Diabetes- A1c 8.0-  with peripheral neuropathy by hx only on Jardiance at home, use SSI in the hospital  -10/23/23 CBGs variable but likely d/t steroids; cont SSI for now 4/29- CBG's very labile in last 24 hours 99-383- - it concerns me to start Jardiance since of Foley and risk of UTI's- also for starting Tradjenta  since BG was 99 last night- don't want to drop BG too much 4/30- Will add Tradjenta  since BG's still running 150-242- will monitor closely for CBG hypoglycemia-   10/30/23 CBGs looking great!  -10/31/23 CBGs a little higher the last day, but monitor for trend  5/5- Cbgs all less than 200- con't to monitor  5/6- CBGs 130s- 180s- 5/7-5/9 CBG's 128 to 220- had snack yesterday- still snacking in afternoon -5/10-11/25 CBGs great except for afternoon d/t snacks. Leave meds alone for now -5/12 fair glucose control, continue current regimen and monitor. 5/13- 5/15- great control except for afternoon- snacking- will d/w pt 5/16- Cbgs jumping higher- if this continues to occur, will need other meds for CBG's -11/13/23 CBGs so variable but mostly it's the afternoon/evening values; will start carb modified diet as he's on regular diet right now. If not, might need to add other med? -11/14/23 CBGs much better overnight; asked that he limit his sugar (has regular cola next to his bed), hopefully diet alone will control his sugars better; pt aware and agreeable.  CBG (last 3)  Recent Labs    11/13/23 1622 11/13/23 2124 11/14/23 0619  GLUCAP 182* 198* 183*     11.  Spasticity affecting LEs> UE and R>L , aggressive ROM  with PT,  consider oral meds  4/22- starting baclofen  5 mg TID- with his Cr 1.50- has been running 1.68 to 2.30  4/24- will not increase baclofen  due to Renal issues 5/9- will add Dantrolene  50 mg at bedtime to reduce chance of spasticity but also reduce chance of side effects/sedation- - will monitor -11/07/23 stopped baclofen  d/t delirium, cont dantrolene  -5/12 continue dantrolene , continue hold baclofen  5/13- will increase Dantrolene  after 5 days 5/14- increase Dantrolene  to 50 mg BID 5/15- no side effects so far 12.  PAD s/p SFA and Iliac stenting, continue metoprolol  50mg  daily  13. CKD3b? With AKI -4/22- Pt's Cr down to 1.50 from 2.29 2 days ago and 1.68 yesterday- will monitor 2x/week to see if will reduce further or be labile.   4/24- Cr 1.84- and BUN stable at 55- is drinking per pt  4/28- Cr 1.63- doing better 4/29- Cr 1.59 after 12+ hours of IVF's- and BUN very slightly down to 70 from 72-   5/1- Cr down to 1.43 and BUN down to 53  5/5- Cr 1.51 and BUN down to 37- drinking more!  5/8- Cr 1.8- up from 1.51- but BUN 34 down from 37- will push fluids  5/12 creatinine and BUN much lower at 1.33/26-improved  5/15- Cr 1.68 and BUN 30 this AM- BUN improving- but Cr bounces 14. Neurogenic bowel: continue miralax  32g daily, senokot s 2 tabs qAM  5/1- LBM this Am- was accident- might need bowel program 5/2- Placed on suppository nightly with possible dig stim- had multiple BM's during/after bowel program- if continues like this, will need dig stim.   5/6- BM last evening- no bowel note for last few nights seen  5/7- LBM 2 nights ago- no bowel note- d/w nursing coordinator 11/07/23 LBM last night 5/12 LBM night before last.  Patient reports he was not given his suppository last night-appears documented. KUB and LFTS ordered 5/13- LFTs look good- having nightly BM's with bowel program- need to teach wife to do 5/14- pt going to SNF- wife doesn't want to learn bowel program 5/15- good results- with  bowel program 5/16- only mucus last night- will monitor to make sure has BM over weekend- he usually goes daily.  -11/14/23 sorbitol  yesterday, BM last night, cont regimen 15. HLD: crestor  20mg  nightly 16. Delirium/Confusion 4/29- yesterday given IVF's for this- checked U/A and Cx- which showed mod leuks, however rate bacteria and very little WBC's 6-10- which is not a UTI in most cases- will d/w pharmacy-  -pt still pleasantly confused this AM- but didn't even open eyes, was so sleepy, so hard to tease out what is due to sleepiness and what is actual delirium- per nurse, much better last night-  4/30- pt Ox3 this AM- very with it- likely flexeril  based on chart review- given for pain since on no pain meds- will keep off Flexeril  and monitor Sx's  5/5- Sx's resolved  -11/07/23 SEE ABOVE 17. Hx of HTN with severe hypotension 4/30- pt's BP dropped to 79 systolic yesterday when sitting with OT- not clear if had TEDS/ACE wraps- will reduce Metoprolol  XL to 12.5 mg daily- from 50 mg daily and see if that helps- don't want ot add midodrine  quite yet.  5/1- Adding midodrine  5 mg TID and adding abd binder- couldn't tolerate ACE wraps at all- just TED's-   5/2- BP yesterday was less symptomatic- but still dropped some- con't regimen -5/3-4/25 BPs a bit better recently; monitor 5/6-5/11 BP doing better- con't regimen 5/12 orthostatic hypotension noted this morning, bolus IVF 500cc. 5/13- Will increase Midodrine  to 7.5 mg TID 5/14- 5/15- BP slightly better actually has 1 episode of 155 systolic so wait to increase Midodrine  5/16-18- BP doing better overall per chart- will monitor trend Vitals:   11/10/23 1832 11/11/23 0314 11/11/23 0805 11/11/23 1249  BP: (!) 116/51 (!) 121/57 (!) 155/69 (!) 127/55   11/11/23 1803 11/12/23 0510 11/12/23 0813 11/12/23 2050  BP: (!) 112/45 (!) 103/55 133/73 (!) 117/50   11/13/23 0559 11/13/23 1348 11/13/23 1957 11/14/23 0300  BP: (!) 106/57 (!) 120/50 (!) 129/49 126/65      18. Atelectasis 4/30- sounds coarse- asked pt to use ICS regularly- is at bedside- also described "spitting things up"_ had "spit cup"- at bedside-  had barely anything in it. 5/5- not spitting up anymore  19. R heel DTI and Stage I on sacrum 5/2- is new- ordered prevalon boots as well as low air loss mattress and needs to be turned q2 hours- placed order  5/8- pt not able to tolerate air mattress- turn q2 hours and prevalons for Heel DTI- d/w nursing- just removed this AM 5/12 patient asked for device to keep pressure off his heel, Prevalon boots at bedside. Will chek with nursing regarding use of these boots at night.  20. Spasticity  5/5- will d/w wife tomorrow about restarting Baclofen  vs Dantrolene .   5/6- attempted to call wife again x2- cannot get her 5/7- was able to see wife yesterday and discussed- we will retry Pt on Baclofen  5 mg TID for now- not a higher dose due to renal issues-  and monitor for confusion/delirium- if gets confused, will try Dantrolene .   5/8- no  confusion and RLE much less pain- so think it's working 5/9- will add Dantrolene  as above 5/14- Increased dantrolene  to 50 mg BID 5/16- will increase again on 5/19- Monday 21. Severe malnutrition  5/5- per Dietitian- will do supplements 22. Mild tachycardia 5/8- per pt, has ALWAYS had tachycardia- has been worked up- and nothing found- usually 90s-100's per pt- which is the same here- will monitor 23. Ileus??? 5/13- reached out to Radiology about pt's questionable ileus- not clear in Radiology report  5/14- doesn't have ileus- but full of gas- started Simethicone  QID 24. Nasal congestion with mild cough 5/15- probably due to post nasal drip- will start Flonase  2 sprays each nare daily.   5/16- says nasal congestion much better- won't need Flonase - will make prn    LOS: 27 days A FACE TO FACE EVALUATION WAS PERFORMED  [address] 11/14/2023, 10:16 AM

## 2023-11-15 LAB — BASIC METABOLIC PANEL WITH GFR
Anion gap: 6 (ref 5–15)
BUN: 30 mg/dL — ABNORMAL HIGH (ref 8–23)
CO2: 30 mmol/L (ref 22–32)
Calcium: 8.5 mg/dL — ABNORMAL LOW (ref 8.9–10.3)
Chloride: 103 mmol/L (ref 98–111)
Creatinine, Ser: 1.64 mg/dL — ABNORMAL HIGH (ref 0.61–1.24)
GFR, Estimated: 42 mL/min — ABNORMAL LOW (ref >60)
Glucose, Bld: 130 mg/dL — ABNORMAL HIGH (ref 70–99)
Potassium: 3.9 mmol/L (ref 3.5–5.1)
Sodium: 139 mmol/L (ref 135–145)

## 2023-11-15 LAB — CBC WITH DIFFERENTIAL/PLATELET
Abs Immature Granulocytes: 0.18 10*3/uL — ABNORMAL HIGH (ref 0.00–0.07)
Basophils Absolute: 0.1 10*3/uL (ref 0.0–0.1)
Basophils Relative: 1 %
Eosinophils Absolute: 0.4 10*3/uL (ref 0.0–0.5)
Eosinophils Relative: 3 %
HCT: 29.1 % — ABNORMAL LOW (ref 39.0–52.0)
Hemoglobin: 9.3 g/dL — ABNORMAL LOW (ref 13.0–17.0)
Immature Granulocytes: 1 %
Lymphocytes Relative: 9 %
Lymphs Abs: 1.2 10*3/uL (ref 0.7–4.0)
MCH: 29.4 pg (ref 26.0–34.0)
MCHC: 32 g/dL (ref 30.0–36.0)
MCV: 92.1 fL (ref 80.0–100.0)
Monocytes Absolute: 1.1 10*3/uL — ABNORMAL HIGH (ref 0.1–1.0)
Monocytes Relative: 8 %
Neutro Abs: 10.3 10*3/uL — ABNORMAL HIGH (ref 1.7–7.7)
Neutrophils Relative %: 78 %
Platelets: 346 10*3/uL (ref 150–400)
RBC: 3.16 MIL/uL — ABNORMAL LOW (ref 4.22–5.81)
RDW: 14.7 % (ref 11.5–15.5)
WBC: 13.2 10*3/uL — ABNORMAL HIGH (ref 4.0–10.5)
nRBC: 0 % (ref 0.0–0.2)

## 2023-11-15 LAB — GLUCOSE, CAPILLARY
Glucose-Capillary: 130 mg/dL — ABNORMAL HIGH (ref 70–99)
Glucose-Capillary: 186 mg/dL — ABNORMAL HIGH (ref 70–99)
Glucose-Capillary: 171 mg/dL — ABNORMAL HIGH (ref 70–99)
Glucose-Capillary: 151 mg/dL — ABNORMAL HIGH (ref 70–99)

## 2023-11-15 NOTE — Progress Notes (Signed)
 Occupational Therapy Session Note  Patient Details  Name: Christian Bailey MRN: [id number] Date of Birth: Dec 06, 1940  Today's Date: 11/15/2023 OT Individual Time: 1355-1525 OT Individual Time Calculation (min): 90 min    Short Term Goals: Week 4:  OT Short Term Goal 1 (Week 4): Pt will complete UB dressing at Min A consistently throughout sessions OT Short Term Goal 2 (Week 4): Pt will complete dynamic sitting with SBA in preparation for ADL tasks OT Short Term Goal 3 (Week 4): Pt will complete grooming at SBA seated in W/C  Skilled Therapeutic Interventions/Progress Updates:      Therapy Documentation Precautions:  Precautions Precautions: Fall, Cervical Precaution/Restrictions Comments: decreased recall of precautions, not able to verbalize any when prompted Required Braces or Orthoses: Cervical Brace Cervical Brace: Soft collar Restrictions Weight Bearing Restrictions Per Provider Order: No General: "It's always a pleasure" Pt supine in bed upon OT arrival, agreeable to OT session.  Pain: no pain reported  ADL: OT providing skilled intervention on ADL retraining in order to increase independence with tasks and increase activity tolerance. Pt completed the following tasks at the current level of assist: Bed mobility: Mod A using bed rails for assistance for turning Lt and Rt, Max A for supine>EOB Toileting: pt reporting needing to use bed pan, OT assisted pt on bed pan, pt reported only gas no voiding, total A overall LB dressing: total A overall to don pants, threading foley through pant leg  Footwear: total A for donning TED hose and PRAFO boots at end of session Transfers:  Balance: OT providing skilled intervention for  standing activities in order to promote increased balance strategies with ADL participation. Pt completed activities at standing level with intermittent supported/unsupported standing at RW. Pt completing sit to stands at Mod A-Max A with BLE knee block with VC  for anterior lean within // bars. Pt completed 5 trials of sit to stands with grading up of activity for each trial. OT instructing in weight shifting from Lt to Rt, then intermittent unilateral UE support to complete reaching motion with BUE (increased difficulty with Rt side d/t shoulder limitations), then finally grading up to weigt shifting and completing reaching motion simultaneously. Pt able to complete static standing within // bars at Min A with VC for weight shift forward and posture. Pt posture increasing as trials progressed.   Pt seated in W/C at end of session with W/C alarm donned, call light within reach and 4Ps assessed.    Therapy/Group: Individual Therapy  Nila Barth, OTD, OTR/L 11/15/2023, 4:10 PM

## 2023-11-15 NOTE — Progress Notes (Incomplete)
 IP Rehab Bowel Program Documentation   Bowel Program Start time 1816   Dig Stim Indicated? {YES/NO:21197} Dig Stim Prior to Suppository or mini Enema X {Numbers; 1-5:17750}   Output from dig stim: {Desc; minimal/small/moderate/large/very large:110034}  Ordered intervention: Suppository Yes , mini enema No ,   Repeat dig stim after Suppository or Mini enema  X {Numbers; 1-5:17750},  Output? {Desc; minimal/small/moderate/large/very large:110034}   Bowel Program Complete? {YES/NO:21197}, handoff given yes   Patient Tolerated? Yes

## 2023-11-15 NOTE — Consult Note (Addendum)
 WOC Nurse wound follow up Wound type: DTPI on right heel. HAPI. Measurement: 3cm X 1.5cm.  Wound bed: Intact skin over dark red-purple  Drainage (amount, consistency, odor) NONE Periwound: Intact Dressing procedure/placement/frequency: Apply foam dressing, change every 3 days or PRN.  WOC team will follow weekly. Please reconsult if further assistance is needed. Thank-you,  Rachel Budds BSN, RN, [employer], WOC  (Pager: [phone])

## 2023-11-15 NOTE — Progress Notes (Signed)
 Physical Therapy Session Note  Patient Details  Name: Christian Bailey MRN: [id number] Date of Birth: Oct 12, 1940  Today's Date: 11/15/2023 PT Individual Time: 4098-1191 PT Individual Time Calculation (min): 72 min   Short Term Goals: Week 4:  PT Short Term Goal 1 (Week 4): Pt will be able to progress lateral leans for slideboard placement with max A PT Short Term Goal 2 (Week 4): Pt will be able to perform rolling with bed rail features with with mod assist PT Short Term Goal 3 (Week 4): Pt will be able to tolerate WB in standing x 10 min for tone management and NMR  Skilled Therapeutic Interventions/Progress Updates:     Pt received seated in [facility] and agrees to therapy. Reports pain in tailbone. Number not provided. PT provides rest breaks as needed to manage pain. Pt drives WC to gym with minA for navigating crowded environments. Pt performs slideboard transfer from [facility] to mat table with modA and cues for body mechanics, hand placement, and sequencing. Session initially focuses on sitting balance and core strengthening. Pt cued to attempt short sitting without upper extremity support to challenge core strength and stability. PT positioned behind pt on exercise ball to provide back support. Pt cued to perform eccentric control of trunk extension until resting on ball (~1-2" posterior to pt), then performing abdominal contraction to return to neutral sitting posture. Pt completes x10 prior to rest break. PT then increases distance of ball to ~3-4" and pt completes additional 2x5 with rest break and increased difficulty noted. Pt transitions to supine with maxA. In supine PT provides manual soft tissue lengthening of lower extremity muscle groups including hamstrings with contract-relax technique, hip abductions with end range oscillations, and glute stretch into end range hip flexion. Pt then completes 3x10 bridges in hooklying with verbal and tactile cues for correct performance and for NM feedback. Supine  to sit with maxA. Stand pivot from mat>WC>bed with maxA and cues for hip extension, knee extension, lateral weigh shifting and progression of stepping pattern. Sit to supine with totalA. Left with alarm intact and all needs within reach.    Therapy Documentation Precautions:  Precautions Precautions: Fall, Cervical Precaution/Restrictions Comments: decreased recall of precautions, not able to verbalize any when prompted Required Braces or Orthoses: Cervical Brace Cervical Brace: Soft collar Restrictions Weight Bearing Restrictions Per Provider Order: No   Therapy/Group: Individual Therapy  Neva Barban, PT, DPT 11/15/2023, 4:21 PM

## 2023-11-15 NOTE — Progress Notes (Signed)
 PROGRESS NOTE   Subjective/Complaints:  Pt reports butt was hurting hom- on coccyx- so asked to be turned- asked nursing to turn him q2 hours- placed order.  Minimal amount of pain except R heel and coccyx.  Had good large BM with bowel program-  Slept well.  Denies urinary dysuria/pain- has Foley.  No confusion  ROS:   Pt denies SOB, abd pain, CP, N/V/C/D, and vision changes   + Heel discomfort- on R side- less so + nausea/vomiting- -hasn't recurred   Objective:   No results found.    Recent Labs    11/15/23 0702  WBC 13.2*  HGB 9.3*  HCT 29.1*  PLT 346       Recent Labs    11/15/23 0702  NA 139  K 3.9  CL 103  CO2 30  GLUCOSE 130*  BUN 30*  CREATININE 1.64*  CALCIUM  8.5*        Intake/Output Summary (Last 24 hours) at 11/15/2023 0831 Last data filed at 11/15/2023 0755 Gross per 24 hour  Intake 240 ml  Output 850 ml  Net -610 ml     Pressure Injury 10/28/23 Sacrum Medial Stage 1 -  Intact skin with non-blanchable redness of a localized area usually over a bony prominence. STAGE 1 ON SACRUM (Active)  10/28/23 1333  Location: Sacrum  Location Orientation: Medial  Staging: Stage 1 -  Intact skin with non-blanchable redness of a localized area usually over a bony prominence.  Wound Description (Comments): STAGE 1 ON SACRUM  Present on Admission: No     Pressure Injury 10/28/23 Heel Right Deep Tissue Pressure Injury - Purple or maroon localized area of discolored intact skin or blood-filled blister due to damage of underlying soft tissue from pressure and/or shear. DTI on bottom of heel (Active)  10/28/23 1820  Location: Heel  Location Orientation: Right  Staging: Deep Tissue Pressure Injury - Purple or maroon localized area of discolored intact skin or blood-filled blister due to damage of underlying soft tissue from pressure and/or shear.  Wound Description (Comments): DTI on bottom of  heel  Present on Admission: No    Physical Exam: Vital Signs Blood pressure (!) 134/59, pulse 94, temperature 98.1 F (36.7 C), temperature source Oral, resp. rate 18, height 5\' 4"  (1.626 m), weight 60.6 kg, SpO2 97%.     General: awake, alert, appropriate, sitting up in bed; asking nursing to pull up in bed to eat; NAD HENT: conjugate gaze; oropharynx moist CV: regular rate and rhythm- rate in 90's. ; no JVD Pulmonary: CTA B/L; no W/R/R- good air movement GI: soft, NT, ND, (+)BS- normoactive Psychiatric: appropriate- pleasant Neurological: Ox3  GU- light yellow amber urine-no change again today Skin: Heels covered with foam dressings B/L- R heel DTI and Sacral Stage I-- not reassessed 5/18 Wearing prevalons B/L   Prior exam MSK: Ue's 4/5 B/L throughout LE's- 2-/5 B/L-in HF/KE- 4-/5 DF/PF but RLE weaker than LLE Spasticity- MAS of 1+ to 2 in Ue's this Am and 3 in RLE- MAS worse this AM- 2-3 throughout Neurologic: Cranial nerves II through XII intact, motor strength is 4/5 in bilateral  bicep, tricep, grip,and finger ext and hand intrinsics,  3- RIght delt and 4/5 left delt, 3- bilateral  hip flexor, 3- RIght and 4- left knee extensors, 4/5 bilateral ankle dorsiflexor and plantar flexor Sensory exam normal sensation to light touch and proprioception in bilateral upper and lower extremities Tone- 3+ a bilateral biceps triceps and BR, + Hoffman's bilateral 2/5 RIght 3+ left knee Also MAS 3 tone in RIght Knee flexors and extensors, MAS 1 at the left knee Clonus at bilateral ankles  Musculoskeletal: Full range of motion in all 4 extremities. No joint swelling      Assessment/Plan: 1. Functional deficits which require 3+ hours per day of interdisciplinary therapy in a comprehensive inpatient rehab setting. Physiatrist is providing close team supervision and 24 hour management of active medical problems listed below. Physiatrist and rehab team continue to assess barriers to  discharge/monitor patient progress toward functional and medical goals  Care Tool:  Bathing              Bathing assist Assist Level: Total Assistance - Patient < 25%     Upper Body Dressing/Undressing Upper body dressing   What is the patient wearing?: Pull over shirt    Upper body assist Assist Level: Moderate Assistance - Patient 50 - 74%    Lower Body Dressing/Undressing Lower body dressing      What is the patient wearing?: Underwear/pull up, Pants     Lower body assist Assist for lower body dressing: Dependent - Patient 0%     Toileting Toileting    Toileting assist Assist for toileting: Dependent - Patient 0%     Transfers Chair/bed transfer  Transfers assist     Chair/bed transfer assist level: 2 Helpers (min/mod A to scoot, +2 needed for lateral lean for slideboard)     Locomotion Ambulation   Ambulation assist   Ambulation activity did not occur: Safety/medical concerns  Assist level: 2 helpers Assistive device: [name] Max distance: 10'   Walk 10 feet activity   Assist  Walk 10 feet activity did not occur: Safety/medical concerns  Assist level: 2 helpers Assistive device: Walker-Eva   Walk 50 feet activity   Assist Walk 50 feet with 2 turns activity did not occur: Safety/medical concerns         Walk 150 feet activity   Assist Walk 150 feet activity did not occur: Safety/medical concerns         Walk 10 feet on uneven surface  activity   Assist Walk 10 feet on uneven surfaces activity did not occur: Safety/medical concerns         Wheelchair     Assist Is the patient using a wheelchair?: Yes Type of Wheelchair: Power    Wheelchair assist level: Supervision/Verbal cueing Max wheelchair distance: 200    Wheelchair 50 feet with 2 turns activity    Assist        Assist Level: Supervision/Verbal cueing   Wheelchair 150 feet activity     Assist      Assist Level: Supervision/Verbal  cueing   Blood pressure (!) 134/59, pulse 94, temperature 98.1 F (36.7 C), temperature source Oral, resp. rate 18, height 5\' 4"  (1.626 m), weight 60.6 kg, SpO2 97%.  Medical Problem List and Plan: 1. 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  Antithrombotics: -DVT/anticoagulation:  Mechanical: Sequential compression devices, below knee Bilateral lower extremities 4/23- will see if Dr Ellery Guthrie will allow us  to start Lovenox - he said yes, so will start. 30mg  daily. 5/14- when leaves, will go on Eliquis for a total of 3 months 2.5 mg BID             -antiplatelet therapy: N/A 3. Pain Management: tylenol  prn. Voltaren  QID, lyrica  50mg  BID, flexeril  PRN -10/24/23 leg muscle soreness likely from PT/OT this week; will start muscle rub cream 4/28- still c/o muscle soreness in legs- con't pain regimen as needed 4/30- still sore, but don't want Flexeril  since likely caused confusion- con't tylenol  prn- can use muscle rub which is ordered 5/1- denies a lot of pain- mainly sore form spasticity/working out 5/5- pain yesterday- not today-  5/7- will add Tramadol  50 mg q8 hours prn if pain won't go away otherwise-  due to renal function- doing TID prn- hopefully baclofen  helps pain in RLE as well 5/8- pain better since started baclofen - didn't need tramadol  5/9- will add Dantrolene  50 mg at bedtime and see if tolerates- if so, will con't to increase the dose to help spasticity and reduce chance of sedation/side effects  -11/07/23 very delirious yesterday per pt, will stop baclofen  and leave Dantrolene  for now; monitor -5/12 continue current regimen, mentation sounds like it  is better today 5/13- Dantrolene  not making confused so far- will increase after 5 days- added baclofen  and Flexeril  to allergy list 5/14- Doing better with tylenol  and muscle rub 5/19- pain doing well as long as turned and gets prevalon boots 4. Mood/Behavior/Sleep: LCSW to follow up for evaluation and support when appropriate --Delirium precautions--His mental status has cleared but will monitor for worsening with transfer to new unit .pt has not required prn meds for agitation in several days  Monitor sleep wake cycle             -antipsychotic agents: Change haldol  q 6 hrs prn to Seroquel  12.5mg  tid prn  -Restoril  7.5mg  nightly, cymbalta  20mg  daily 4/23-4/25 no delirium so far 4/28- having some delirium this AM? Not severe, but will monitor since has had in recent past 4/29- still confused this AM- but per nursing , was less confused overnight after Restoril  stopped- still pleasantly confused this AM when first woke up- didn't open eyes.  5/9 Aox3 this AM -11/07/23 delirious yesterday, stopped baclofen  as above. 5/14- resolved  5. Neuropsych/cognition: This patient is usually  capable of making decisions on his own behalf. 6. Skin/Wound Care: Routine pressure relief measures. 7. Fluids/Electrolytes/Nutrition:  Monitor I/O. Routine labs. Continue vitamins/supplements. 8. 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 Willye Harvey, per note, leave in place for 7-10d, Consider resuming In-N-Out catheterization for management of his neurogenic bladder at that time. Recommend using a coud catheter for intermittent catheterization (when resumed).  4/28- will leave foley for 7-10  days 10/31/23 foley likely ready to come out tomorrow? Placed 4/25 5/5- will d/c foley in AM  after speaking with pt 5/6- pt wants ot do what wife wants- cannot reach wife to discuss foley removal- pt doesn't want to do cathing and sounds like would rather keep foley- but will discuss with him further 5/7- pt and wife have decided they don't want to remove foley - they don't want ot go back to in/out caths-  since wasn't emptying before- they want a chronic foley- when offered to remove foley just for rehab, to see if he gets return, he is scared will get bleeding again, due to false passage already had and declined 5/12 Foley draining clear yellow urine 5/16- wife still wants and if going to SNF, will need to keep 10.  Diabetes- A1c 8.0-  with peripheral neuropathy by hx only on Jardiance at home, use SSI in the hospital  -10/23/23 CBGs variable but likely d/t steroids; cont SSI for now 4/29- CBG's very labile in last 24 hours 99-383- - it concerns me to start Jardiance since of Foley and risk of UTI's- also for starting Tradjenta  since BG was 99 last night- don't want to drop BG too much 4/30- Will add Tradjenta  since BG's still running 150-242- will monitor closely for CBG hypoglycemia-   10/30/23 CBGs looking great!  -10/31/23 CBGs a little higher the last day, but monitor for trend  5/5- Cbgs all less than 200- con't to monitor  5/6- CBGs 130s- 180s- 5/7-5/9 CBG's 128 to 220- had snack yesterday- still snacking in afternoon -5/10-11/25 CBGs great except for afternoon d/t snacks. Leave meds alone for now -5/12 fair glucose control, continue current regimen and monitor. 5/13- 5/15- great control except for afternoon- snacking- will d/w pt 5/16- Cbgs jumping higher- if this continues to occur, will need other meds for CBG's -11/13/23 CBGs so variable but mostly it's the afternoon/evening values; will start carb modified diet as he's on regular diet right now. If not, might need to add other  med? -11/14/23 CBGs much better overnight; asked that he limit his sugar (has regular cola next to his bed), hopefully diet alone will control his sugars better; pt aware and agreeable.  5/19- Cbgs looking much better CBG (last 3)  Recent Labs    11/14/23 1614 11/14/23 2114 11/15/23 0546  GLUCAP 185* 199* 130*     11.  Spasticity affecting LEs> UE and R>L , aggressive ROM  with PT, consider oral meds  4/22- starting baclofen  5 mg TID- with his Cr 1.50- has been running 1.68 to 2.30  4/24- will not increase baclofen  due to Renal issues 5/9- will add Dantrolene  50 mg at bedtime to reduce chance of spasticity but also reduce chance of side effects/sedation- - will monitor -11/07/23 stopped baclofen  d/t delirium, cont dantrolene  -5/12 continue dantrolene , continue hold baclofen  5/13- will increase Dantrolene  after 5 days 5/14- increase Dantrolene  to 50 mg BID 5/15- no side effects so far 12.  PAD s/p SFA and Iliac stenting, continue metoprolol  50mg  daily  13. CKD3b? With AKI -4/22- Pt's Cr down to 1.50 from 2.29 2 days ago and 1.68 yesterday- will monitor 2x/week to see if will reduce further or be labile.   4/24- Cr 1.84- and BUN stable at 55- is drinking per pt  4/28- Cr 1.63- doing better 4/29- Cr 1.59 after 12+ hours of IVF's- and BUN very slightly down to 70 from 72-   5/1- Cr down to 1.43 and BUN down to 53  5/5- Cr 1.51 and BUN down to 37- drinking more!  5/8- Cr 1.8- up from  1.51- but BUN 34 down from 37- will push fluids  5/12 creatinine and BUN much lower at 1.33/26-improved  5/15- Cr 1.68 and BUN 30 this AM- BUN improving- but Cr bounces  5/19- BUN 30 and Cr 1.64 14. Neurogenic bowel: continue miralax  32g daily, senokot s 2 tabs qAM  5/1- LBM this Am- was accident- might need bowel program 5/2- Placed on suppository nightly with possible dig stim- had multiple BM's during/after bowel program- if continues like this, will need dig stim.   5/6- BM last evening- no bowel note for  last few nights seen 5/7- LBM 2 nights ago- no bowel note- d/w nursing coordinator 11/07/23 LBM last night 5/12 LBM night before last.  Patient reports he was not given his suppository last night-appears documented. KUB and LFTS ordered 5/13- LFTs look good- having nightly BM's with bowel program- need to teach wife to do 5/14- pt going to SNF- wife doesn't want to learn bowel program 5/15- good results- with bowel program 5/16- only mucus last night- will monitor to make sure has BM over weekend- he usually goes daily.  -11/14/23 sorbitol  yesterday, BM last night, cont regimen 5/19- Had large BM last night 15. HLD: crestor  20mg  nightly 16. Delirium/Confusion 4/29- yesterday given IVF's for this- checked U/A and Cx- which showed mod leuks, however rate bacteria and very little WBC's 6-10- which is not a UTI in most cases- will d/w pharmacy-  -pt still pleasantly confused this AM- but didn't even open eyes, was so sleepy, so hard to tease out what is due to sleepiness and what is actual delirium- per nurse, much better last night-  4/30- pt Ox3 this AM- very with it- likely flexeril  based on chart review- given for pain since on no pain meds- will keep off Flexeril  and monitor Sx's  5/5- Sx's resolved  -11/07/23 SEE ABOVE 17. Hx of HTN with severe hypotension 4/30- pt's BP dropped to 79 systolic yesterday when sitting with OT- not clear if had TEDS/ACE wraps- will reduce Metoprolol  XL to 12.5 mg daily- from 50 mg daily and see if that helps- don't want ot add midodrine  quite yet.  5/1- Adding midodrine  5 mg TID and adding abd binder- couldn't tolerate ACE wraps at all- just TED's-   5/2- BP yesterday was less symptomatic- but still dropped some- con't regimen -5/3-4/25 BPs a bit better recently; monitor 5/6-5/11 BP doing better- con't regimen 5/12 orthostatic hypotension noted this morning, bolus IVF 500cc. 5/13- Will increase Midodrine  to 7.5 mg TID 5/14- 5/15- BP slightly better actually has 1  episode of 155 systolic so wait to increase Midodrine  5/16-18- BP doing better overall per chart- will monitor trend Vitals:   11/11/23 1249 11/11/23 1803 11/12/23 0510 11/12/23 0813  BP: (!) 127/55 (!) 112/45 (!) 103/55 133/73   11/12/23 2050 11/13/23 0559 11/13/23 1348 11/13/23 1957  BP: (!) 117/50 (!) 106/57 (!) 120/50 (!) 129/49   11/14/23 0300 11/14/23 1340 11/14/23 1934 11/15/23 0510  BP: 126/65 (!) 115/59 128/74 (!) 134/59     18. Atelectasis 4/30- sounds coarse- asked pt to use ICS regularly- is at bedside- also described "spitting things up"_ had "spit cup"- at bedside-  had barely anything in it. 5/5- not spitting up anymore  19. R heel DTI and Stage I on sacrum 5/2- is new- ordered prevalon boots as well as low air loss mattress and needs to be turned q2 hours- placed order  5/8- pt not able to tolerate air mattress- turn q2 hours and  prevalons for Heel DTI- d/w nursing- just removed this AM 5/12 patient asked for device to keep pressure off his heel, Prevalon boots at bedside. Will chek with nursing regarding use of these boots at night.  20. Spasticity  5/5- will d/w wife tomorrow about restarting Baclofen  vs Dantrolene .   5/6- attempted to call wife again x2- cannot get her 5/7- was able to see wife yesterday and discussed- we will retry Pt on Baclofen  5 mg TID for now- not a higher dose due to renal issues-  and monitor for confusion/delirium- if gets confused, will try Dantrolene .   5/8- no confusion and RLE much less pain- so think it's working 5/9- will add Dantrolene  as above 5/14- Increased dantrolene  to 50 mg BID 5/16- will increase again on 5/19- Monday 21. Severe malnutrition  5/5- per Dietitian- will do supplements 22. Mild tachycardia 5/8- per pt, has ALWAYS had tachycardia- has been worked up- and nothing found- usually 90s-100's per pt- which is the same here- will monitor 23. Ileus??? 5/13- reached out to Radiology about pt's questionable ileus- not clear in  Radiology report  5/14- doesn't have ileus- but full of gas- started Simethicone  QID 24. Nasal congestion with mild cough 5/15- probably due to post nasal drip- will start Flonase  2 sprays each nare daily.   5/16- says nasal congestion much better- won't need Flonase - will make prn 25. Leukocytosis  5/19- no signs of infection, no fever, no confusion- will recheck in AM unless her feels ill today.    I spent a total of 38   minutes on total care today- >50% coordination of care- due to  D/w PA as well as nursing about leukocytosis- and turning pt.    LOS: 28 days A FACE TO FACE EVALUATION WAS PERFORMED  Mariajose Mow 11/15/2023, 8:31 AM

## 2023-11-15 NOTE — Progress Notes (Signed)
 IP Rehab Bowel Program Documentation   Bowel Program Start time 1818  Dig Stim Indicated? Given by dayshift Dig Stim Prior to Suppository or mini Enema X Given by dayshift   Output from dig stim: Given by dayshift  Ordered intervention: Suppository Yes , mini enema No ,   Repeat dig stim after Suppository or Mini enema  X 1,  Output? Large  Bowel Program Complete? Yes , handoff given   Patient Tolerated? Yes

## 2023-11-15 NOTE — Progress Notes (Signed)
 Occupational Therapy Session Note  Patient Details  Name: Christian Bailey MRN: [id number] Date of Birth: 13-Nov-1940  Today's Date: 11/15/2023 OT Individual Time: 2542-7062 OT Individual Time Calculation (min): 42 min    Short Term Goals: Week 3:  OT Short Term Goal 1 (Week 3): Pt will complete dynamic sitting with SBA in preparation for ADL tasks OT Short Term Goal 1 - Progress (Week 3): Progressing toward goal OT Short Term Goal 2 (Week 3): Pt will complete bed mobility with Mod A in preparation for ADLs OT Short Term Goal 2 - Progress (Week 3): Not met OT Short Term Goal 3 (Week 3): Pt will complete UB dressing at Min A consistently throughout sessions OT Short Term Goal 3 - Progress (Week 3): Progressing toward goal  Skilled Therapeutic Interventions/Progress Updates:    Pt resting in bed upon arrival. BP 102/52 with HOB elevated, not Ted hose or abd binder. During preparation for donning Ted hose, pt reported that his pants were wet and he had not spilled any liquids. Rolling R/L using bed rails with mod A to facilitate OTA pulling pants over hips. It was discovered that connection on foley catheter had become dislodged. RN notified and new Foley bag kit ordered. Remainder of session focus on bed mobility to facilitate increased independence and decrease burden of care. Pt progressed to min A rolling R/L using bed rails. Prevalon boot placed on RLE. Pt remained in bed with all needs within reach. Bed alarm activated.   Therapy Documentation Precautions:  Precautions Precautions: Fall, Cervical Precaution/Restrictions Comments: decreased recall of precautions, not able to verbalize any when prompted Required Braces or Orthoses: Cervical Brace Cervical Brace: Soft collar Restrictions Weight Bearing Restrictions Per Provider Order: No Pain:  Pt denies pain this afternoon    Therapy/Group: Individual Therapy  [name] 11/15/2023, 2:13 PM

## 2023-11-16 LAB — CBC WITH DIFFERENTIAL/PLATELET
Abs Immature Granulocytes: 0.2 10*3/uL — ABNORMAL HIGH (ref 0.00–0.07)
Basophils Absolute: 0.1 10*3/uL (ref 0.0–0.1)
Basophils Relative: 1 %
Eosinophils Absolute: 0.4 10*3/uL (ref 0.0–0.5)
Eosinophils Relative: 4 %
HCT: 27.7 % — ABNORMAL LOW (ref 39.0–52.0)
Hemoglobin: 8.8 g/dL — ABNORMAL LOW (ref 13.0–17.0)
Immature Granulocytes: 2 %
Lymphocytes Relative: 11 %
Lymphs Abs: 1.2 10*3/uL (ref 0.7–4.0)
MCH: 29 pg (ref 26.0–34.0)
MCHC: 31.8 g/dL (ref 30.0–36.0)
MCV: 91.4 fL (ref 80.0–100.0)
Monocytes Absolute: 0.9 10*3/uL (ref 0.1–1.0)
Monocytes Relative: 8 %
Neutro Abs: 8.3 10*3/uL — ABNORMAL HIGH (ref 1.7–7.7)
Neutrophils Relative %: 74 %
Platelets: 357 10*3/uL (ref 150–400)
RBC: 3.03 MIL/uL — ABNORMAL LOW (ref 4.22–5.81)
RDW: 14.6 % (ref 11.5–15.5)
WBC: 11 10*3/uL — ABNORMAL HIGH (ref 4.0–10.5)
nRBC: 0 % (ref 0.0–0.2)

## 2023-11-16 LAB — GLUCOSE, CAPILLARY
Glucose-Capillary: 116 mg/dL — ABNORMAL HIGH (ref 70–99)
Glucose-Capillary: 182 mg/dL — ABNORMAL HIGH (ref 70–99)
Glucose-Capillary: 173 mg/dL — ABNORMAL HIGH (ref 70–99)
Glucose-Capillary: 178 mg/dL — ABNORMAL HIGH (ref 70–99)

## 2023-11-16 MED ORDER — DANTROLENE SODIUM 25 MG PO CAPS
50.0000 mg | ORAL_CAPSULE | Freq: Three times a day (TID) | ORAL | Status: DC
  Administered 2023-11-16 – 2023-11-24 (×21): 50 mg via ORAL
  Filled 2023-11-16 (×25): qty 2

## 2023-11-16 MED ORDER — SIMETHICONE 80 MG PO CHEW: 80.0000 mg | CHEWABLE_TABLET | Freq: Four times a day (QID) | ORAL | Status: DC | PRN

## 2023-11-16 NOTE — Progress Notes (Incomplete)
 Occupational Therapy Session Note  Patient Details  Name: Christian Bailey MRN: [id number] Date of Birth: [birth year]  Today's Date: 11/16/2023 OT Individual Time: 0930-1000 OT Individual Time Calculation (min): 30 min    Short Term Goals: Week 3:  OT Short Term Goal 1 (Week 3): Pt will complete dynamic sitting with SBA in preparation for ADL tasks OT Short Term Goal 1 - Progress (Week 3): Progressing toward goal OT Short Term Goal 2 (Week 3): Pt will complete bed mobility with Mod A in preparation for ADLs OT Short Term Goal 2 - Progress (Week 3): Not met OT Short Term Goal 3 (Week 3): Pt will complete UB dressing at Min A consistently throughout sessions OT Short Term Goal 3 - Progress (Week 3): Progressing toward goal  Skilled Therapeutic Interventions/Progress Updates:    Skilled OT services with focus on bed mobility, sitting balance, TB transfers, and activity tolerance to increase independence with BADLs. Rolling R/L in bed with mod A and use of bedrails. Supine>sit EOB with max A. Pt able to initiate moving BLE to EOB. Sitting balance EOB with CGA and pt using BUE for support on EOB. TB transfer to [facility] with mod A and max verbal cues for forward weight shift and hand placement. Pt remained in PWC with all needs within reach.   Therapy Documentation Precautions:  Precautions Precautions: Fall, Cervical Precaution/Restrictions Comments: decreased recall of precautions, not able to verbalize any when prompted Required Braces or Orthoses: Cervical Brace Cervical Brace: Soft collar Restrictions Weight Bearing Restrictions Per Provider Order: No    Pain: Pt c/o mild BLE pain; meds admin prior to therapy   Therapy/Group: Individual Therapy  [name] 11/16/2023, 10:45 AM

## 2023-11-16 NOTE — Progress Notes (Signed)
 Occupational Therapy Session Note  Patient Details  Name: Christian Bailey MRN: [id number] Date of Birth: 03/28/1941  Today's Date: 11/16/2023 OT Individual Time: 1100-1129 & 1445-1430 OT Individual Time Calculation (min): 29 min & 65 min   Short Term Goals: OT Short Term Goal 1 (Week 4): Pt will complete UB dressing at Min A consistently throughout sessions OT Short Term Goal 2 (Week 4): Pt will complete dynamic sitting with SBA in preparation for ADL tasks OT Short Term Goal 3 (Week 4): Pt will complete grooming at SBA seated in W/C  Skilled Therapeutic Interventions/Progress Updates:      Therapy Documentation Precautions:  Precautions Precautions: Fall, Cervical Precaution/Restrictions Comments: decreased recall of precautions, not able to verbalize any when prompted Required Braces or Orthoses: Cervical Brace Cervical Brace: Soft collar Restrictions Weight Bearing Restrictions Per Provider Order: No Session 1 General: "Hello there!" Pt seated in W/C upon OT arrival, agreeable to OT.  Pain: no pain reported  ADL: OT providing skilled intervention for dynamic sitting balance, W/C mobility and household navigation with PWC in order to increase independence. Pt propelled with PWC throughout hallway& ADL suite at SBA-Min A level to complete scavenger hunt to retrieve items and navigate through small spaces. Pt retrieved items with reacher and Min A for increased anterior lean to reach items.   Pt seated in W/C at end of session with W/C alarm donned, call light within reach and 4Ps assessed. Pt tilted back for pressure relief   Session 2 General: "That was great" Pt seated in W/C with nsg for wound photos upon OT arrival, agreeable to OT.  Pain: no pain reported  ADL: OT providing skilled intervention on ADL retraining in order to increase independence with tasks and increase activity tolerance. Pt completed the following tasks at the current level of assist: Bed mobility:  Mod A EOB>supine, VC for propping UB onto elbow while OT assisting with BLE Toilet transfer: stedy transfer to toilet. Mod-Max A to stand from toilet to stedy, pt appreciative of getting to toilet  Toileting: pt felt as if he needed to use bathroom once practicing toilet transfers, Max A while pt standing in stedy, OT assisting with pants management, no void this date although would normally need assistance to manage hygiene Footwear: total A to don PRAFO boots at end of session and doff TED hose  Transfers: Pt Mod-Max A to stand to stedy for multiple trials, VC for posture  Exercises: OT providing skilled intervention for dynamic sitting activitiy in order to promote increased balance strategies with ADL participation. Pt completed activitiy at W/C level with intermittent unsupported sitting at in [facility]. Pt completed reaching forward out of BOS with BUE to manipulate connect 4 pieces. Pt able to pull self forward in W/C into unsupported sitting at SBA.   Pt supine in bed with bed alarm activated, 2 bed rails up, call light within reach and 4Ps assessed.   Therapy/Group: Individual Therapy  Nila Barth, OTD, OTR/L 11/16/2023, 4:06 PM

## 2023-11-16 NOTE — Progress Notes (Signed)
  IP Rehab Bowel Program Documentation   Bowel Program Start time 1816  Dig Stim Indicated? Yes  Dig Stim Prior to Suppository or mini Enema X 1   Output from dig stim: None  Ordered intervention: Suppository Yes , mini enema No ,   Repeat dig stim after Suppository or Mini enema  X 1,  Output? Large   Bowel Program Complete? Yes , handoff given   Patient Tolerated? Yes

## 2023-11-16 NOTE — Progress Notes (Signed)
 PROGRESS NOTE   Subjective/Complaints:  Pt reports coccyx still hurting him- they turned q2 hours, but he had them stop at 4am- because too tired and said didn't feel like it helped the pain- said was turned with 1 pillow, but didn't feel like was really on his side, would like to really be on side.   Much fewer spasms- only once in awhile. Is wearing prevalon boots. But R heel still bothers him.  Per nursing, buttocks is now stage II on coccyx.    ROS:    Pt denies SOB, abd pain, CP, N/V/C/D, and vision changes  + Heel discomfort- on R side- still evident and buttock/coccyx pain + nausea/vomiting- -hasn't recurred   Objective:   No results found.    Recent Labs    11/15/23 0702 11/16/23 0549  WBC 13.2* 11.0*  HGB 9.3* 8.8*  HCT 29.1* 27.7*  PLT 346 357       Recent Labs    11/15/23 0702  NA 139  K 3.9  CL 103  CO2 30  GLUCOSE 130*  BUN 30*  CREATININE 1.64*  CALCIUM  8.5*        Intake/Output Summary (Last 24 hours) at 11/16/2023 1610 Last data filed at 11/16/2023 0745 Gross per 24 hour  Intake 596 ml  Output 850 ml  Net -254 ml     Pressure Injury 10/28/23 Sacrum Medial Stage 1 -  Intact skin with non-blanchable redness of a localized area usually over a bony prominence. STAGE 1 ON SACRUM (Active)  10/28/23 1333  Location: Sacrum  Location Orientation: Medial  Staging: Stage 1 -  Intact skin with non-blanchable redness of a localized area usually over a bony prominence.  Wound Description (Comments): STAGE 1 ON SACRUM  Present on Admission: No     Pressure Injury 10/28/23 Heel Right Deep Tissue Pressure Injury - Purple or maroon localized area of discolored intact skin or blood-filled blister due to damage of underlying soft tissue from pressure and/or shear. DTI on bottom of heel (Active)  10/28/23 1820  Location: Heel  Location Orientation: Right  Staging: Deep Tissue Pressure Injury  - Purple or maroon localized area of discolored intact skin or blood-filled blister due to damage of underlying soft tissue from pressure and/or shear.  Wound Description (Comments): DTI on bottom of heel  Present on Admission: No    Physical Exam: Vital Signs Blood pressure (!) 112/56, pulse 86, temperature 98 F (36.7 C), temperature source Oral, resp. rate 17, height 5\' 4"  (1.626 m), weight 60.6 kg, SpO2 98%.      General: awake, alert, appropriate, sitting up in bed; NAD HENT: conjugate gaze; oropharynx moist CV: regular rate and rhythm; no JVD Pulmonary: CTA B/L; no W/R/R- good air movement GI: soft, NT, ND, (+)BS- normoactive Psychiatric: appropriate- brighter affect Neurological: Ox3 MAS of 1+ to 2 in RLE- no clonus GU- light yellow amber urine-a little darker this AM Skin: Heels covered with foam dressings B/L- R heel DTI and Sacral Stage II not assessed today Wearing prevalons B/L   Prior exam MSK: Ue's 4/5 B/L throughout LE's- 2-/5 B/L-in HF/KE- 4-/5 DF/PF but RLE weaker than LLE Spasticity- MAS of 1+ to  2 in Ue's this Am and 3 in RLE- MAS worse this AM- 2-3 throughout Neurologic: Cranial nerves II through XII intact, motor strength is 4/5 in bilateral  bicep, tricep, grip,and finger ext and hand intrinsics, 3- RIght delt and 4/5 left delt, 3- bilateral  hip flexor, 3- RIght and 4- left knee extensors, 4/5 bilateral ankle dorsiflexor and plantar flexor Sensory exam normal sensation to light touch and proprioception in bilateral upper and lower extremities Tone- 3+ a bilateral biceps triceps and BR, + Hoffman's bilateral 2/5 RIght 3+ left knee Also MAS 3 tone in RIght Knee flexors and extensors, MAS 1 at the left knee Clonus at bilateral ankles  Musculoskeletal: Full range of motion in all 4 extremities. No joint swelling      Assessment/Plan: 1. Functional deficits which require 3+ hours per day of interdisciplinary therapy in a comprehensive inpatient rehab  setting. Physiatrist is providing close team supervision and 24 hour management of active medical problems listed below. Physiatrist and rehab team continue to assess barriers to discharge/monitor patient progress toward functional and medical goals  Care Tool:  Bathing              Bathing assist Assist Level: Total Assistance - Patient < 25%     Upper Body Dressing/Undressing Upper body dressing   What is the patient wearing?: Pull over shirt    Upper body assist Assist Level: Moderate Assistance - Patient 50 - 74%    Lower Body Dressing/Undressing Lower body dressing      What is the patient wearing?: Underwear/pull up, Pants     Lower body assist Assist for lower body dressing: Dependent - Patient 0%     Toileting Toileting    Toileting assist Assist for toileting: Dependent - Patient 0%     Transfers Chair/bed transfer  Transfers assist     Chair/bed transfer assist level: 2 Helpers (min/mod A to scoot, +2 needed for lateral lean for slideboard)     Locomotion Ambulation   Ambulation assist   Ambulation activity did not occur: Safety/medical concerns  Assist level: 2 helpers Assistive device: [name] Max distance: 10'   Walk 10 feet activity   Assist  Walk 10 feet activity did not occur: Safety/medical concerns  Assist level: 2 helpers Assistive device: Walker-Eva   Walk 50 feet activity   Assist Walk 50 feet with 2 turns activity did not occur: Safety/medical concerns         Walk 150 feet activity   Assist Walk 150 feet activity did not occur: Safety/medical concerns         Walk 10 feet on uneven surface  activity   Assist Walk 10 feet on uneven surfaces activity did not occur: Safety/medical concerns         Wheelchair     Assist Is the patient using a wheelchair?: Yes Type of Wheelchair: Power    Wheelchair assist level: Supervision/Verbal cueing Max wheelchair distance: 200    Wheelchair 50 feet  with 2 turns activity    Assist        Assist Level: Supervision/Verbal cueing   Wheelchair 150 feet activity     Assist      Assist Level: Supervision/Verbal cueing   Blood pressure (!) 112/56, pulse 86, temperature 98 F (36.7 C), temperature source Oral, resp. rate 17, height 5\' 4"  (1.626 m), weight 60.6 kg, SpO2 98%.  Medical Problem List and Plan: 1. 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  Antithrombotics: -DVT/anticoagulation:  Mechanical: Sequential compression devices, below knee Bilateral lower extremities 4/23- will see if Dr Ellery Guthrie will allow us  to start Lovenox - he said yes, so will start. 30mg  daily. 5/14- when leaves, will go on Eliquis for a total of 3 months 2.5 mg BID             -antiplatelet therapy: N/A 3. Pain Management: tylenol  prn. Voltaren  QID, lyrica  50mg  BID, flexeril  PRN 5/8- pain better since started baclofen - didn't need tramadol  5/9- will add Dantrolene  50 mg due to Allergy with Baclofen  at bedtime and see if tolerates- if so, will con't to increase the dose to help spasticity and reduce chance of sedation/side effects  -11/07/23 very delirious yesterday per pt, will stop baclofen  and leave Dantrolene  for now; monitor 5/19- pain doing well as long as turned and gets prevalon boots 5/20- pt reports pain still on backside and R heel 4. Mood/Behavior/Sleep: LCSW to follow up for evaluation and support when appropriate --Delirium precautions--His mental status has cleared but will monitor for worsening with transfer to new unit .pt has not required prn meds for agitation in several days  Monitor sleep  wake cycle             -antipsychotic agents: Change haldol  q 6 hrs prn to Seroquel  12.5mg  tid prn  -Restoril  7.5mg  nightly, cymbalta  20mg  daily 4/23-4/25 no delirium so far 4/29- still confused this AM-  5/9 Aox3 this AM -11/07/23 delirious yesterday, stopped baclofen  as above. 5/14- resolved  5. Neuropsych/cognition: This patient is usually  capable of making decisions on his own behalf. 6. Skin/Wound Care: Routine pressure relief measures.  5/20- Has Stage II on backside- now has minor breakdown per nursing- and R heel DTI- has prevalons- pt to be turned q2 hours- couldn't tolerate air bed.   7. Fluids/Electrolytes/Nutrition:  Monitor I/O. Routine labs. Continue vitamins/supplements. 8. 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 Willye Harvey, per note, leave in place for 7-10d, Consider resuming In-N-Out catheterization for management of his neurogenic bladder at that time. Recommend using a coud catheter for intermittent catheterization (when resumed).  5/7- pt and wife have decided they don't want to remove foley - they don't want ot go back to in/out caths-  since wasn't emptying before- they want a chronic foley- when offered to remove foley just for rehab, to see if he gets return, he is scared will get bleeding again, due to false passage already had and declined 5/12 Foley draining clear yellow urine 5/16- wife still wants and if going to SNF, will need to keep 5/20- pt wants to keep Foley- doesn't want to go back to in/out caths 10.  Diabetes- A1c 8.0-  with peripheral neuropathy by hx only on Jardiance at home, use SSI  in the hospital  -10/23/23 CBGs variable but likely  d/t steroids; cont SSI for now 4/29- CBG's very labile in last 24 hours 99-383- - it concerns me to start Jardiance since of Foley and risk of UTI's- also for starting Tradjenta  since BG was 99 last night- don't want to drop BG too much 4/30- Will add Tradjenta  since BG's still running 150-242- will monitor closely for CBG hypoglycemia-   10/30/23 CBGs looking great!  -10/31/23 CBGs a little higher the last day, but monitor for trend  5/5- Cbgs all less than 200- con't to monitor  5/6- CBGs 130s- 180s- 5/7-5/9 CBG's 128 to 220- had snack yesterday- still snacking in afternoon -5/10-11/25 CBGs great except for afternoon d/t snacks. Leave meds alone for now -5/12 fair glucose control, continue current regimen and monitor. 5/13- 5/15- great control except for afternoon- snacking- will d/w pt 5/16- Cbgs jumping higher- if this continues to occur, will need other meds for CBG's -11/13/23 CBGs so variable but mostly it's the afternoon/evening values; will start carb modified diet as he's on regular diet right now. If not, might need to add other med? -11/14/23 CBGs much better overnight; asked that he limit his sugar (has regular cola next to his bed), hopefully diet alone will control his sugars better; pt aware and agreeable.  5/19-5/20 Cbgs looking much better CBG (last 3)  Recent Labs    11/15/23 1641 11/15/23 2050 11/16/23 0614  GLUCAP 171* 151* 116*     11.  Spasticity affecting LEs> UE and R>L , aggressive ROM  with PT, consider oral meds  4/22- starting baclofen  5 mg TID- with his Cr 1.50- has been running 1.68 to 2.30  4/24- will not increase baclofen  due to Renal issues 5/9- will add Dantrolene  50 mg at bedtime to reduce chance of spasticity but also reduce chance of side effects/sedation- - will monitor -11/07/23 stopped baclofen  d/t delirium, cont dantrolene  5/20- increased Dantrolene  to 50 mg TID- no side effects at this time 12.  PAD s/p SFA and Iliac stenting, continue metoprolol  50mg   daily  13. CKD3b? With AKI- AKI has resolved  4/28- Cr 1.63- doing better 4/29- Cr 1.59 after 12+ hours of IVF's- and BUN very slightly down to 70 from 72-   5/1- Cr down to 1.43 and BUN down to 53  5/5- Cr 1.51 and BUN down to 37- drinking more!  5/8- Cr 1.8- up from 1.51- but BUN 34 down from 37- will push fluids  5/12 creatinine and BUN much lower at 1.33/26-improved  5/15- Cr 1.68 and BUN 30 this AM- BUN improving- but Cr bounces  5/19- BUN 30 and Cr 1.64 14. Neurogenic bowel: continue miralax  32g daily, senokot s 2 tabs qAM  5/1- LBM this Am- was accident- might need bowel program 5/20- Bowel program yielded results without dig stim last night- feeling good- con't regimen 15. HLD: crestor  20mg  nightly 16. Delirium/Confusion 4/30- pt Ox3 this AM- very with it- likely flexeril  based on chart review- given for pain since on no pain meds- will keep off Flexeril  and monitor Sx's  5/5- Sx's resolved  -11/07/23 SEE ABOVE 17. Hx of HTN with severe hypotension 5/6-5/11 BP doing better- con't regimen 5/12 orthostatic hypotension noted this morning, bolus IVF 500cc. 5/13- Will increase Midodrine  to 7.5 mg TID 5/14- 5/15- BP slightly better actually has 1 episode of 155 systolic so wait to increase Midodrine  5/16-5/20- BP doing better- only one episode where BP 110's- con't to monitor Vitals:   11/12/23  0813 11/12/23 2050 11/13/23 0559 11/13/23 1348  BP: 133/73 (!) 117/50 (!) 106/57 (!) 120/50   11/13/23 1957 11/14/23 0300 11/14/23 1340 11/14/23 1934  BP: (!) 129/49 126/65 (!) 115/59 128/74   11/15/23 0510 11/15/23 1334 11/15/23 1932 11/16/23 0346  BP: (!) 134/59 122/60 112/62 (!) 112/56     18. Atelectasis 4/30- sounds coarse- asked pt to use ICS regularly- is at bedside- also described "spitting things up"_ had "spit cup"- at bedside-  had barely anything in it. 5/5- not spitting up anymore  19. R heel DTI and Stage I on sacrum 5/2- is new- ordered prevalon boots as well as low air  loss mattress and needs to be turned q2 hours- placed order  5/8- pt not able to tolerate air mattress- turn q2 hours and prevalons for Heel DTI- d/w nursing- just removed this AM 5/12 patient asked for device to keep pressure off his heel, Prevalon boots at bedside. Will chek with nursing regarding use of these boots at night.  20. Spasticity  5/5- will d/w wife tomorrow about restarting Baclofen  vs Dantrolene .   5/6- attempted to call wife again x2- cannot get her 5/7- was able to see wife yesterday and discussed- we will retry Pt on Baclofen  5 mg TID for now- not a higher dose due to renal issues-  and monitor for confusion/delirium- if gets confused, will try Dantrolene .   5/8- no confusion and RLE much less pain- so think it's working 5/9- will add Dantrolene  as above 5/14- Increased dantrolene  to 50 mg BID 5/20- increase Dantrolene  to 50 mg TID- is helping some and no side effects 21. Severe malnutrition  5/5- per Dietitian- will do supplements 22. Mild tachycardia 5/8- per pt, has ALWAYS had tachycardia- has been worked up- and nothing found- usually 90s-100's per pt- which is the same here- will monitor 23. Ileus??? 5/13- reached out to Radiology about pt's questionable ileus- not clear in Radiology report  5/14- doesn't have ileus- but full of gas- started Simethicone  QID  5/20- make Gas-X prn per pt request 24. Nasal congestion with mild cough 5/15- probably due to post nasal drip- will start Flonase  2 sprays each nare daily.   5/16- says nasal congestion much better- won't need Flonase - will make prn 25. Leukocytosis  5/19- no signs of infection, no fever, no confusion- will recheck in AM unless her feels ill today.   5/20- WBC down to 11k- will con't to monitor- and recheck Thursday   I spent a total of 51   minutes on total care today- >50% coordination of care- due to  D/w pt about turning, spasticity and making some meds prn- also team conference to f/u on progress- - made  multiple meds changes and d/w nursing 3 different times   LOS: 29 days A FACE TO FACE EVALUATION WAS PERFORMED  Sultana Tierney 11/16/2023, 8:33 AM

## 2023-11-16 NOTE — Progress Notes (Incomplete)
 IP Rehab Bowel Program Documentation   Bowel Program Start time 1820   Dig Stim Indicated? {YES/NO:21197} Dig Stim Prior to Suppository or mini Enema X {Numbers; 1-5:17750}   Output from dig stim: {Desc; minimal/small/moderate/large/very large:110034}  Ordered intervention: Suppository Yes , mini enema No ,   Repeat dig stim after Suppository or Mini enema  X {Numbers; 1-5:17750},  Output? {Desc; minimal/small/moderate/large/very large:110034}   Bowel Program Complete? {YES/NO:21197}, handoff given yes   Patient Tolerated? Yes

## 2023-11-16 NOTE — Progress Notes (Signed)
 IP Rehab Bowel Program Documentation    Bowel Program Start time 1820   Dig Stim Indicated? Yes  Dig Stim Prior to Suppository or mini Enema X 1    Output from dig stim: None   Ordered intervention: Suppository Yes , mini enema No ,    Repeat dig stim after Suppository or Mini enema  X 1,   Output? small    Bowel Program Complete? Yes , handoff given    Patient Tolerated? Yes

## 2023-11-16 NOTE — Progress Notes (Signed)
 Occupational Therapy Weekly Progress Note  Patient Details  Name: Christian Bailey MRN: [id number] Date of Birth: 04-21-1941  Beginning of progress report period: Nov 09, 2023 End of progress report period: Nov 16, 2023   Patient has met 3 of 3 short term goals.  Pt has slowly started progressing toward goals and started completing standing within // bars and squat pivot transfers in order for increased participation in ADLs. Pt still requiring significant assistance with ADLs d/t history of shoulder injuries/limitations. Pt awaiting SNF placement.  Patient continues to demonstrate the following deficits: muscle weakness and muscle joint tightness, decreased cardiorespiratoy endurance, abnormal tone and unbalanced muscle activation, and decreased sitting balance, decreased standing balance, decreased postural control, and decreased balance strategies and therefore will continue to benefit from skilled OT intervention to enhance overall performance with BADL and Reduce care partner burden.  Patient progressing toward long term goals..  Continue plan of care.  OT Short Term Goals Week 3:  OT Short Term Goal 1 (Week 3): Pt will complete dynamic sitting with SBA in preparation for ADL tasks OT Short Term Goal 1 - Progress (Week 3): Progressing toward goal OT Short Term Goal 2 (Week 3): Pt will complete bed mobility with Mod A in preparation for ADLs OT Short Term Goal 2 - Progress (Week 3): Not met OT Short Term Goal 3 (Week 3): Pt will complete UB dressing at Min A consistently throughout sessions OT Short Term Goal 3 - Progress (Week 3): Progressing toward goal Week 4:  OT Short Term Goal 1 (Week 4): Pt will complete UB dressing at Min A consistently throughout sessions OT Short Term Goal 1 - Progress (Week 4): Met OT Short Term Goal 2 (Week 4): Pt will complete dynamic sitting with SBA in preparation for ADL tasks OT Short Term Goal 2 - Progress (Week 4): Met OT Short Term Goal 3 (Week 4): Pt  will complete grooming at SBA seated in W/C OT Short Term Goal 3 - Progress (Week 4): Met  Skilled Therapeutic Interventions/Progress Updates:      Therapy Documentation Precautions:  Precautions Precautions: Fall, Cervical Precaution/Restrictions Comments: decreased recall of precautions, not able to verbalize any when prompted Required Braces or Orthoses: Cervical Brace Cervical Brace: Soft collar Restrictions Weight Bearing Restrictions Per Provider Order: No   Therapy/Group: Individual Therapy  Nila Barth, OTD, OTR/L 11/16/2023, 4:20 PM

## 2023-11-16 NOTE — Progress Notes (Signed)
 Physical Therapy Session Note  Patient Details  Name: Christian Bailey MRN: [id number] Date of Birth: 20-Apr-1941  Today's Date: 11/16/2023 PT Individual Time: 4098-1191 PT Individual Time Calculation (min): 71 min   Short Term Goals: Week 4:  PT Short Term Goal 1 (Week 4): Pt will be able to progress lateral leans for slideboard placement with max A PT Short Term Goal 2 (Week 4): Pt will be able to perform rolling with bed rail features with with mod assist PT Short Term Goal 3 (Week 4): Pt will be able to tolerate WB in standing x 10 min for tone management and NMR  Skilled Therapeutic Interventions/Progress Updates:     Pt received seated in [facility] and agrees to therapy. Reports some pain in RLE during session. PT provides rest breaks as needed to manage pain. Pt drives WC x150' to gym with cues for navigation and [facility] management. Pt performs multiple reps of sit to stand during session, working on balance, WB through lower extremities for endurance, strengthening, and tone management, as well as core strengthening and balance training. Pt requires maxA and increased time to complete sit to stand, with multiple cues for knee, hip, and trunk extension. Once neutral posture is obtained, pt able to maintain standing with ~minA for several minutes at a time. Pt performs multiple reps of sit to stand with extended seated rest break between bouts. Pt attempts ambulation several times but has difficulty with Rt knee integrity during attempts, buckling and unable to maintain balance to progress contralateral foot. WC transport back to room. Left seated in WC with alarm intact and all needs within reach.   Therapy Documentation Precautions:  Precautions Precautions: Fall, Cervical Precaution/Restrictions Comments: decreased recall of precautions, not able to verbalize any when prompted Required Braces or Orthoses: Cervical Brace Cervical Brace: Soft collar Restrictions Weight Bearing Restrictions Per  Provider Order: No  Therapy/Group: Individual Therapy  Neva Barban, PT, DPT 11/16/2023, 4:39 PM

## 2023-11-17 LAB — GLUCOSE, CAPILLARY
Glucose-Capillary: 114 mg/dL — ABNORMAL HIGH (ref 70–99)
Glucose-Capillary: 205 mg/dL — ABNORMAL HIGH (ref 70–99)
Glucose-Capillary: 164 mg/dL — ABNORMAL HIGH (ref 70–99)
Glucose-Capillary: 138 mg/dL — ABNORMAL HIGH (ref 70–99)

## 2023-11-17 NOTE — Progress Notes (Signed)
 PROGRESS NOTE   Subjective/Complaints:  Pt reports she's been propped/turned with wedge that I was able to get pt from therapy.   Said asked for tylenol  2-3x yesterday and caused pain to go away.  Small BM with bowel program last evening.  Backside and R heel still sore- but either the same or slightly better this AM  ROS:   Pt denies SOB, abd pain, CP, N/V/C/D, and vision changes   + Heel discomfort- on R side- still evident and buttock/coccyx pain + nausea/vomiting- -hasn't recurred   Objective:   No results found.    Recent Labs    11/15/23 0702 11/16/23 0549  WBC 13.2* 11.0*  HGB 9.3* 8.8*  HCT 29.1* 27.7*  PLT 346 357       Recent Labs    11/15/23 0702  NA 139  K 3.9  CL 103  CO2 30  GLUCOSE 130*  BUN 30*  CREATININE 1.64*  CALCIUM  8.5*        Intake/Output Summary (Last 24 hours) at 11/17/2023 0747 Last data filed at 11/17/2023 0141 Gross per 24 hour  Intake 472 ml  Output 2000 ml  Net -1528 ml     Pressure Injury 10/28/23 Sacrum Medial Stage 1 -  Intact skin with non-blanchable redness of a localized area usually over a bony prominence. STAGE 1 ON SACRUM (Active)  10/28/23 1333  Location: Sacrum  Location Orientation: Medial  Staging: Stage 1 -  Intact skin with non-blanchable redness of a localized area usually over a bony prominence.  Wound Description (Comments): STAGE 1 ON SACRUM  Present on Admission: No     Pressure Injury 10/28/23 Heel Right Deep Tissue Pressure Injury - Purple or maroon localized area of discolored intact skin or blood-filled blister due to damage of underlying soft tissue from pressure and/or shear. DTI on bottom of heel (Active)  10/28/23 1820  Location: Heel  Location Orientation: Right  Staging: Deep Tissue Pressure Injury - Purple or maroon localized area of discolored intact skin or blood-filled blister due to damage of underlying soft tissue from  pressure and/or shear.  Wound Description (Comments): DTI on bottom of heel  Present on Admission: No     Pressure Injury 11/16/23 Coccyx Right;Upper Stage 2 -  Partial thickness loss of dermis presenting as a shallow open injury with a red, pink wound bed without slough. (Active)  11/16/23 1458  Location: Coccyx  Location Orientation: Right;Upper  Staging: Stage 2 -  Partial thickness loss of dermis presenting as a shallow open injury with a red, pink wound bed without slough.  Wound Description (Comments):   Present on Admission: No    Physical Exam: Vital Signs Blood pressure (!) 102/55, pulse (!) 110, temperature 97.8 F (36.6 C), temperature source Oral, resp. rate 16, height 5\' 4"  (1.626 m), weight 60.6 kg, SpO2 93%.      General: awake, alert, appropriate, woke form sleep; supine in bed; NAD HENT: conjugate gaze; oropharynx moist CV: regular rhythm, tachycardic rate; rate; no JVD Pulmonary: CTA B/L; no W/R/R- good air movement GI: soft, NT, ND, (+)BS- slightly hypoactive Psychiatric: appropriate Neurological: Ox3  MAS of 1+ to 2 in RLE- no clonus  GU- light yellow amber urine-a little darker this AM Skin: Heels covered with foam dressings B/L- R heel DTI and Sacral Stage II not assessed today Wearing prevalons B/L   Prior exam MSK: Ue's 4/5 B/L throughout LE's- 2-/5 B/L-in HF/KE- 4-/5 DF/PF but RLE weaker than LLE Spasticity- MAS of 1+ to 2 in Ue's this Am and 3 in RLE- MAS worse this AM- 2-3 throughout Neurologic: Cranial nerves II through XII intact, motor strength is 4/5 in bilateral  bicep, tricep, grip,and finger ext and hand intrinsics, 3- RIght delt and 4/5 left delt, 3- bilateral  hip flexor, 3- RIght and 4- left knee extensors, 4/5 bilateral ankle dorsiflexor and plantar flexor Sensory exam normal sensation to light touch and proprioception in bilateral upper and lower extremities Tone- 3+ a bilateral biceps triceps and BR, + Hoffman's bilateral 2/5 RIght 3+  left knee Also MAS 3 tone in RIght Knee flexors and extensors, MAS 1 at the left knee Clonus at bilateral ankles  Musculoskeletal: Full range of motion in all 4 extremities. No joint swelling      Assessment/Plan: 1. Functional deficits which require 3+ hours per day of interdisciplinary therapy in a comprehensive inpatient rehab setting. Physiatrist is providing close team supervision and 24 hour management of active medical problems listed below. Physiatrist and rehab team continue to assess barriers to discharge/monitor patient progress toward functional and medical goals  Care Tool:  Bathing              Bathing assist Assist Level: Maximal Assistance - Patient 24 - 49%     Upper Body Dressing/Undressing Upper body dressing   What is the patient wearing?: Pull over shirt    Upper body assist Assist Level: Minimal Assistance - Patient > 75%    Lower Body Dressing/Undressing Lower body dressing      What is the patient wearing?: Underwear/pull up, Pants     Lower body assist Assist for lower body dressing: Dependent - Patient 0%     Toileting Toileting    Toileting assist Assist for toileting: Dependent - Patient 0%     Transfers Chair/bed transfer  Transfers assist     Chair/bed transfer assist level: Maximal Assistance - Patient 25 - 49%     Locomotion Ambulation   Ambulation assist   Ambulation activity did not occur: Safety/medical concerns  Assist level: 2 helpers Assistive device: [name] Max distance: 10'   Walk 10 feet activity   Assist  Walk 10 feet activity did not occur: Safety/medical concerns  Assist level: 2 helpers Assistive device: Walker-Eva   Walk 50 feet activity   Assist Walk 50 feet with 2 turns activity did not occur: Safety/medical concerns         Walk 150 feet activity   Assist Walk 150 feet activity did not occur: Safety/medical concerns         Walk 10 feet on uneven surface   activity   Assist Walk 10 feet on uneven surfaces activity did not occur: Safety/medical concerns         Wheelchair     Assist Is the patient using a wheelchair?: Yes Type of Wheelchair: Power    Wheelchair assist level: Supervision/Verbal cueing Max wheelchair distance: 150'    Wheelchair 50 feet with 2 turns activity    Assist        Assist Level: Supervision/Verbal cueing   Wheelchair 150 feet activity     Assist      Assist Level: Supervision/Verbal cueing  Blood pressure (!) 102/55, pulse (!) 110, temperature 97.8 F (36.6 C), temperature source Oral, resp. rate 16, height 5\' 4"  (1.626 m), weight 60.6 kg, SpO2 93%.  Medical Problem List and Plan: 1. 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  Antithrombotics: -DVT/anticoagulation:  Mechanical: Sequential compression devices, below knee Bilateral lower extremities 4/23- will see if Dr Ellery Guthrie will allow us  to start Lovenox - he said yes, so will start. 30mg  daily. 5/14- when leaves, will go on Eliquis for a total of 3 months 2.5 mg BID             -antiplatelet therapy: N/A 3. Pain Management: tylenol  prn. Voltaren  QID, lyrica  50mg  BID, flexeril  PRN 5/8- pain better since started baclofen - didn't need tramadol  5/9- will add Dantrolene  50 mg due to Allergy with Baclofen  at bedtime and see if tolerates- if so, will con't to increase the dose to help spasticity and reduce chance of sedation/side effects  -11/07/23 very delirious yesterday per pt, will stop baclofen  and leave Dantrolene  for now; monitor 5/19- pain  doing well as long as turned and gets prevalon boots 5/20- pt reports pain still on backside and R heel 5/21- pain meds working -w hen takes Tylenol   4. Mood/Behavior/Sleep: LCSW to follow up for evaluation and support when appropriate --Delirium precautions--His mental status has cleared but will monitor for worsening with transfer to new unit .pt has not required prn meds for agitation in several days  Monitor sleep wake cycle             -antipsychotic agents: Change haldol  q 6 hrs prn to Seroquel  12.5mg  tid prn  -Restoril  7.5mg  nightly, cymbalta  20mg  daily 4/23-4/25 no delirium so far 4/29- still confused this AM-  5/9 Aox3 this AM -11/07/23 delirious yesterday, stopped baclofen  as above. 5/14- resolved  5. Neuropsych/cognition: This patient is usually  capable of making decisions on his own behalf. 6. Skin/Wound Care: Routine pressure relief measures.  5/20- Has Stage II on backside- now has minor breakdown per nursing- and R heel DTI- has prevalons- pt to be turned q2 hours- couldn't tolerate air bed.   5/21- turning with new wedge we got him 7. Fluids/Electrolytes/Nutrition:  Monitor I/O. Routine labs. Continue vitamins/supplements. 8. 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 Willye Harvey, per note, leave in place for 7-10d, Consider resuming In-N-Out catheterization for management of his neurogenic bladder at that time. Recommend using a coud catheter for intermittent catheterization (when resumed).  5/7- pt and wife have decided they don't want to remove foley - they don't want ot go back to in/out caths-  since wasn't emptying before- they want a chronic foley- when  offered  to remove foley just for rehab, to see if he gets return, he is scared will get bleeding again, due to false passage already had and declined 5/12 Foley draining clear yellow urine 5/16- wife still wants and if going to SNF, will need to keep 5/20- pt wants to keep Foley- doesn't want to go back to in/out caths 10.  Diabetes- A1c 8.0-  with peripheral neuropathy by hx only on Jardiance at home, use SSI in the hospital  -10/23/23 CBGs variable but likely d/t steroids; cont SSI for now 4/29- CBG's very labile in last 24 hours 99-383- - it concerns me to start Jardiance since of Foley and risk of UTI's- also for starting Tradjenta  since BG was 99 last night- don't want to drop BG too much 4/30- Will add Tradjenta  since BG's still running 150-242- will monitor closely for CBG hypoglycemia-   10/30/23 CBGs looking great!  -10/31/23 CBGs a little higher the last day, but monitor for trend  5/5- Cbgs all less than 200- con't to monitor  5/6- CBGs 130s- 180s- 5/7-5/9 CBG's 128 to 220- had snack yesterday- still snacking in afternoon -5/10-11/25 CBGs great except for afternoon d/t snacks. Leave meds alone for now -5/12 fair glucose control, continue current regimen and monitor. 5/13- 5/15- great control except for afternoon- snacking- will d/w pt 5/16- Cbgs jumping higher- if this continues to occur, will need other meds for CBG's -11/13/23 CBGs so variable but mostly it's the afternoon/evening values; will start carb modified diet as he's on regular diet right now. If not, might need to add other med? -11/14/23 CBGs much better overnight; asked that he limit his sugar (has regular cola next to his bed), hopefully diet alone will control his sugars better; pt aware and agreeable.  5/19-5/21 Cbgs looking much better CBG (last 3)  Recent Labs    11/16/23 1649 11/16/23 2129 11/17/23 0604  GLUCAP 173* 178* 114*     11.  Spasticity affecting LEs> UE and R>L , aggressive ROM  with PT, consider oral  meds  4/22- starting baclofen  5 mg TID- with his Cr 1.50- has been running 1.68 to 2.30  4/24- will not increase baclofen  due to Renal issues 5/9- will add Dantrolene  50 mg at bedtime to reduce chance of spasticity but also reduce chance of side effects/sedation- - will monitor -11/07/23 stopped baclofen  d/t delirium, cont dantrolene  5/20- increased Dantrolene  to 50 mg TID- no side effects at this time 12.  PAD s/p SFA and Iliac stenting, continue metoprolol  50mg  daily  13. CKD3b? With AKI- AKI has resolved  4/28- Cr 1.63- doing better 4/29- Cr 1.59 after 12+ hours of IVF's- and BUN very slightly down to 70 from 72-   5/1- Cr down to 1.43 and BUN down to 53  5/5- Cr 1.51 and BUN down to 37- drinking more!  5/8- Cr 1.8- up from 1.51- but BUN 34 down from 37- will push fluids  5/12 creatinine and BUN much lower at 1.33/26-improved  5/15- Cr 1.68 and BUN 30 this AM- BUN improving- but Cr bounces  5/19- BUN 30 and Cr 1.64 14. Neurogenic bowel: continue miralax  32g daily, senokot s 2 tabs qAM  5/1- LBM this Am- was accident- might need bowel program 5/20- Bowel program yielded results without dig stim last night- feeling good- con't regimen 15. HLD: crestor  20mg  nightly 16. Delirium/Confusion 4/30- pt Ox3 this AM- very with it- likely flexeril  based on chart review- given for pain since on no pain meds- will keep  off Flexeril  and monitor Sx's  5/5- Sx's resolved  -11/07/23 SEE ABOVE 17. Hx of HTN with severe hypotension 5/6-5/11 BP doing better- con't regimen 5/12 orthostatic hypotension noted this morning, bolus IVF 500cc. 5/13- Will increase Midodrine  to 7.5 mg TID 5/14- 5/15- BP slightly better actually has 1 episode of 155 systolic so wait to increase Midodrine  5/16-5/20- BP doing better- only one episode where BP 110's- con't to monitor Vitals:   11/13/23 1348 11/13/23 1957 11/14/23 0300 11/14/23 1340  BP: (!) 120/50 (!) 129/49 126/65 (!) 115/59   11/14/23 1934 11/15/23 0510 11/15/23  1334 11/15/23 1932  BP: 128/74 (!) 134/59 122/60 112/62   11/16/23 0346 11/16/23 1307 11/16/23 1928 11/17/23 0611  BP: (!) 112/56 (!) 118/59 (!) 114/51 (!) 102/55     18. Atelectasis 4/30- sounds coarse- asked pt to use ICS regularly- is at bedside- also described "spitting things up"_ had "spit cup"- at bedside-  had barely anything in it. 5/5- not spitting up anymore  19. R heel DTI and Stage I on sacrum 5/2- is new- ordered prevalon boots as well as low air loss mattress and needs to be turned q2 hours- placed order  5/8- pt not able to tolerate air mattress- turn q2 hours and prevalons for Heel DTI- d/w nursing- just removed this AM 5/12 patient asked for device to keep pressure off his heel, Prevalon boots at bedside. Will chek with nursing regarding use of these boots at night.  20. Spasticity  5/5- will d/w wife tomorrow about restarting Baclofen  vs Dantrolene .   5/6- attempted to call wife again x2- cannot get her 5/7- was able to see wife yesterday and discussed- we will retry Pt on Baclofen  5 mg TID for now- not a higher dose due to renal issues-  and monitor for confusion/delirium- if gets confused, will try Dantrolene .   5/8- no confusion and RLE much less pain- so think it's working 5/9- will add Dantrolene  as above 5/14- Increased dantrolene  to 50 mg BID 5/20- increase Dantrolene  to 50 mg TID- is helping some and no side effects 21. Severe malnutrition  5/5- per Dietitian- will do supplements 22. Mild tachycardia 5/8- per pt, has ALWAYS had tachycardia- has been worked up- and nothing found- usually 90s-100's per pt- which is the same here- will monitor 23. Ileus??? 5/13- reached out to Radiology about pt's questionable ileus- not clear in Radiology report  5/14- doesn't have ileus- but full of gas- started Simethicone  QID  5/20- make Gas-X prn per pt request 24. Nasal congestion with mild cough 5/15- probably due to post nasal drip- will start Flonase  2 sprays each nare  daily.   5/16- says nasal congestion much better- won't need Flonase - will make prn 25. Leukocytosis  5/19- no signs of infection, no fever, no confusion- will recheck in AM unless her feels ill today.   5/20- WBC down to 11k- will con't to monitor- and recheck Thursday    LOS: 30 days A FACE TO FACE EVALUATION WAS PERFORMED  Christian Bailey 11/17/2023, 7:47 AM

## 2023-11-17 NOTE — Progress Notes (Signed)
 Occupational Therapy Session Note  Patient Details  Name: Christian Bailey MRN: [id number] Date of Birth: 1940/09/05  Today's Date: 11/17/2023 OT Individual Time: 0930-1015 OT Individual Time Calculation (min): 45 min    Short Term Goals: Week 3:  OT Short Term Goal 1 (Week 3): Pt will complete dynamic sitting with SBA in preparation for ADL tasks OT Short Term Goal 1 - Progress (Week 3): Progressing toward goal OT Short Term Goal 2 (Week 3): Pt will complete bed mobility with Mod A in preparation for ADLs OT Short Term Goal 2 - Progress (Week 3): Not met OT Short Term Goal 3 (Week 3): Pt will complete UB dressing at Min A consistently throughout sessions OT Short Term Goal 3 - Progress (Week 3): Progressing toward goal  Skilled Therapeutic Interventions/Progress Updates:    1:1 Pt received dressed and in the power chair. Pt navigated to the gym setup with appropriate speed set. Pt transferred with min A with extra time via slide board to the mat. Practiced transitional movements from sitting to supine to rolling left and right and back to sitting. With using momentum pt able to roll from back to right and back to left with min guard to supervision. Focus on knee extension to flexion and return to extension (in supine) against gravity. Pt with more difficulty with right knee extension with some pain ; active stretch in bilateral Les. Pt able to come into sitting with cues for sequencing; pt able to kick bilateral LEs off mat and min A for trunk control.  Also performed sit to stands from elevated mat with mod A with facilitation for knee extension. Pt with difficulty sustained knee extension- worked on controlled descents back to mat and activated extension in standing. Pt performed squat pivot back to power chair with 3 squats with max A. Pt returned to room with setup of speed control. Pt able to doff shirt (in prep for shower in next session) with min assist. PT shaved at sink with setup and A for  neck around incision site. Pt left up in chair in prep for next session.   Therapy Documentation Precautions:  Precautions Precautions: Fall, Cervical Precaution/Restrictions Comments: decreased recall of precautions, not able to verbalize any when prompted Required Braces or Orthoses: Cervical Brace Cervical Brace: Soft collar Restrictions Weight Bearing Restrictions Per Provider Order: No  Pain:  Some knee pain but able to continue with rest breaks \   Therapy/Group: Individual Therapy  Henrene Locust [facility] 11/17/2023, 10:15 AM

## 2023-11-17 NOTE — Progress Notes (Signed)
 Physical Therapy Session Note  Patient Details  Name: Christian Bailey MRN: [id number] Date of Birth: [birth year]  Today's Date: 11/17/2023 PT Individual Time: 0804-0859 PT Individual Time Calculation (min): 55 min   Short Term Goals: Week 4:  PT Short Term Goal 1 (Week 4): Pt will be able to progress lateral leans for slideboard placement with max A PT Short Term Goal 2 (Week 4): Pt will be able to perform rolling with bed rail features with with mod assist PT Short Term Goal 3 (Week 4): Pt will be able to tolerate WB in standing x 10 min for tone management and NMR  Skilled Therapeutic Interventions/Progress Updates:     Pt received semi reclined in bed and agrees to therapy. No complaint of pain. PT threads pants over legs and pt performs supine to sit with maxA and cues for logrolling and sequencing. Pt performs side leaning from side to side on elbows to assist with pulling up pants, then releases upper extremity support form bed to don shirt with assistance, working on balance and core strength. Pt completes slideboard transfer from bed to [facility] with maxA and cues for anterior trunk lean, body mechanics, and sequencing. Pt drives WC to gym with cues for navigation. PT takes over [facility] management to position power WC in standing frame. Pt participates in standing frame activity to provide WB through BLEs, as well as strengthening hip and core muscles and challenging activity tolerance and upright positioning. Pt able to maintain standing ~2 minutes each time. PT provides cues for engagement of hip and trunk extensors and utilization of upper extremities to assist with posture when needed. Pt drives WC back to room. Left seated with seat belt intact and all needs within reach.    Therapy Documentation Precautions:  Precautions Precautions: Fall, Cervical Precaution/Restrictions Comments: decreased recall of precautions, not able to verbalize any when prompted Required Braces or Orthoses: Cervical  Brace Cervical Brace: Soft collar Restrictions Weight Bearing Restrictions Per Provider Order: No   Therapy/Group: Individual Therapy  Neva Barban, PT, DPT 11/17/2023, 4:33 PM

## 2023-11-17 NOTE — Plan of Care (Signed)
  Problem: Consults Goal: RH GENERAL PATIENT EDUCATION Description: See Patient Education module for education specifics. Outcome: Progressing   Problem: RH BOWEL ELIMINATION Goal: RH STG MANAGE BOWEL WITH ASSISTANCE Description: STG Manage Bowel with mod I  Assistance. Outcome: Progressing Goal: RH STG MANAGE BOWEL W/MEDICATION W/ASSISTANCE Description: STG Manage Bowel with Medication with mod I Assistance. Outcome: Progressing   Problem: RH BLADDER ELIMINATION Goal: RH STG MANAGE BLADDER WITH ASSISTANCE Description: STG Manage Bladder With toileting / mod I Assistance Outcome: Progressing Goal: RH STG MANAGE BLADDER WITH MEDICATION WITH ASSISTANCE Description: STG Manage Bladder With Medication With mod I Assistance. Outcome: Progressing   Problem: RH SAFETY Goal: RH STG ADHERE TO SAFETY PRECAUTIONS W/ASSISTANCE/DEVICE Description: STG Adhere to Safety Precautions With cues Assistance/Device. Outcome: Progressing   Problem: RH PAIN MANAGEMENT Goal: RH STG PAIN MANAGED AT OR BELOW PT'S PAIN GOAL Description: < 4 with prns Outcome: Progressing   Problem: RH KNOWLEDGE DEFICIT GENERAL Goal: RH STG INCREASE KNOWLEDGE OF SELF CARE AFTER HOSPITALIZATION Description: Patient and wife will be able to manage care at discharge using educational resources for medication, skin care,dietary modification and fall prevention independently Outcome: Progressing   Problem: Education: Goal: Ability to describe self-care measures that may prevent or decrease complications (Diabetes Survival Skills Education) will improve Outcome: Progressing Goal: Individualized Educational Video(s) Outcome: Progressing   Problem: Coping: Goal: Ability to adjust to condition or change in health will improve Outcome: Progressing   Problem: Fluid Volume: Goal: Ability to maintain a balanced intake and output will improve Outcome: Progressing   Problem: Health Behavior/Discharge Planning: Goal: Ability  to identify and utilize available resources and services will improve Outcome: Progressing Goal: Ability to manage health-related needs will improve Outcome: Progressing   Problem: Metabolic: Goal: Ability to maintain appropriate glucose levels will improve Outcome: Progressing   Problem: Nutritional: Goal: Maintenance of adequate nutrition will improve Outcome: Progressing Goal: Progress toward achieving an optimal weight will improve Outcome: Progressing   Problem: Skin Integrity: Goal: Risk for impaired skin integrity will decrease Outcome: Progressing

## 2023-11-17 NOTE — Progress Notes (Signed)
 Occupational Therapy Session Note  Patient Details  Name: Christian Bailey MRN: [id number] Date of Birth: 08/05/1940  Today's Date: 11/17/2023 OT Individual Time: 1020-1130 OT Individual Time Calculation (min): 70 min    Short Term Goals: Week 5:  OT Short Term Goal 1 (Week 5): Pt will complete bathing at Mod A with AE as necessary OT Short Term Goal 2 (Week 5): Pt will complete bed mobility at Mod A in order to prepare for ADLs OT Short Term Goal 3 (Week 5): Pt will complete sit to stand with Mod A consistently throughout sessions within stedy  Skilled Therapeutic Interventions/Progress Updates:      Therapy Documentation Precautions:  Precautions Precautions: Fall, Cervical Precaution/Restrictions Comments: decreased recall of precautions, not able to verbalize any when prompted Required Braces or Orthoses: Cervical Brace Cervical Brace: Soft collar Restrictions Weight Bearing Restrictions Per Provider Order: No General: "I feel much better" Pt seated in W/C upon OT arrival, agreeable to OT.  Pain: unrated pain reported in buttocks, activity, intermittent rest breaks, distractions provided for pain management, pt reports tolerable to proceed.   ADL: OT providing skilled intervention on ADL retraining in order to increase independence with tasks and increase activity tolerance. Pt completed the following tasks at the current level of assist: Bed mobility: Mod A overall, pt able to roll Lt and Rt with grab bars and able to maintain side lying for pants management and EOB>supine UB dressing: Mod A, assistance with starting threading of arms, able to thread rest of arms and hand over hand assistance over head LB dressing: total A at bed level using rolling method in order to don over waist Footwear: total A to don PRAFO boots at bed level Shower transfer: stedy transfer Max A to stand for multiple trials during session d/t fatigue, stedy transfer to roll in shower chair  Bathing: Mod A  seated in shower chair, pt using personal long handled loofa, able to reach lower legs and top of back, OT assisting with feet, lower back and buttocks   Other Treatments: OT re dressing wounds/providing wound prevention on bilateral heels, buttocks and skin tears on Rt arm after bathing by donning fresh foam dressing.   Pt supine in bed with bed alarm activated, 2 bed rails up, call light within reach and 4Ps assessed.   Therapy/Group: Individual Therapy  Nila Barth, OTD, OTR/L 11/17/2023, 12:08 PM

## 2023-11-17 NOTE — Progress Notes (Signed)
 Physical Therapy Session Note  Patient Details  Name: Christian Bailey MRN: [id number] Date of Birth: 04/06/1941  Today's Date: 11/17/2023 PT Individual Time: 1400-1430 PT Individual Time Calculation (min): 30 min   Short Term Goals: Week 4:  PT Short Term Goal 1 (Week 4): Pt will be able to progress lateral leans for slideboard placement with max A PT Short Term Goal 2 (Week 4): Pt will be able to perform rolling with bed rail features with with mod assist PT Short Term Goal 3 (Week 4): Pt will be able to tolerate WB in standing x 10 min for tone management and NMR  Skilled Therapeutic Interventions/Progress Updates: Pt sleeping but easily aroused and agreeable to therapy. Pt states pain well controlled at moment. Session focused on bed mobility and sit to stands. Pt completed supine to sit via log roll with modA for truncal support and maxA for BLE management. Pt required maxA ?softer surface for lateral scoots to allow PTA to set up [name]. Participated in x2 Sit to stand with maxA for BLE wt bearing . Pt was unable to complete full knee extension at this time. Pt required heavy modA for supine to sit with use of bed features. Pt was able to roll L/R with minA and demonstrated more initiation with LTR to initiate roll. Pt left in bed at end of session with call bell within reach and needs met.      Therapy Documentation Precautions:  Precautions Precautions: Fall, Cervical Precaution/Restrictions Comments: decreased recall of precautions, not able to verbalize any when prompted Required Braces or Orthoses: Cervical Brace Cervical Brace: Soft collar Restrictions Weight Bearing Restrictions Per Provider Order: No General:   Vital Signs: Therapy Vitals Temp: 97.6 F (36.4 C) Pulse Rate: 94 Resp: 16 BP: (!) 117/59 Patient Position (if appropriate): Lying Oxygen Therapy SpO2: 100 % O2 Device: Room Air   Therapy/Group: Individual Therapy  Chauntelle Azpeitia 11/17/2023, 4:25 PM

## 2023-11-17 NOTE — Progress Notes (Signed)
 P Rehab Bowel Program Documentation    Bowel Program Start time: 1837   Dig Stim Indicated? Yes  Dig Stim Prior to Suppository or mini Enema X 1    Output from dig stim: None   Ordered intervention: Suppository Yes , mini enema No ,    Repeat dig stim after Suppository or Mini enema  X 1,   Output? small    Bowel Program Complete? Yes , handoff given    Patient Tolerated? Yes

## 2023-11-17 NOTE — Patient Care Conference (Signed)
 Inpatient RehabilitationTeam Conference and Plan of Care Update Date:    Time: 2:45 PM    Patient Name: Christian Bailey      Medical Record Number: [id number]  Date of Birth: 08-27-1940 Sex: Male         Room/Bed: 4M03C/4M03C-01 Payor Info: Payor: MEDICARE / Plan: MEDICARE PART A AND B / Product Type: *No Product type* /    Admit Date/Time:  10/18/2023  3:33 PM  Primary Diagnosis:  Acute incomplete quadriplegia [facility])  Hospital Problems: Principal Problem:   Acute incomplete quadriplegia (HCC) Active Problems:   Cervical myelopathy (HCC)   Urinary retention   Neurogenic bladder   Transient iatrogenic urethral bleeding   Depression with anxiety   Adjustment disorder with mixed anxiety and depressed mood   Protein-calorie malnutrition, severe   Coping style affecting medical condition    Expected Discharge Date: Expected Discharge Date:  (pending)  Team Members Present: Physician leading conference: Dr. Celia Coles Social Worker Present: Adrianna Albee, LCSW Nurse Present: Jerene Monks, RN PT Present: Aundria Leech, PT OT Present: Nila Barth, OT PPS Coordinator present : Jestine Moron, SLP     Current Status/Progress Goal Weekly Team Focus  Bowel/Bladder     Bowel program / foley catheter   Continue with bowel program/ foley catheter     Assess bowel and bladder q shift  Swallow/Nutrition/ Hydration               ADL's   total A for ADLs, SBA static sitting balance, Max A +1 for transfers, started squat pivot transfers   Mod A   dynamic balance, ADL retraining    Mobility   maxA bed mobility, modA to maxA slideboard transfers, gait x15' with maxA +2, supervision to minA PWC mobility   min/mod A w/c level transfers and superivision PWC mobility  tone management, increased OOB tolerance, core strengthening and balance    Communication                Safety/Cognition/ Behavioral Observations               Pain      < 4 w/ prn medications     Maintain pain free     Assess pain q shift  Skin      DTI rt heel/stage 2 mid butt   Prevent progress of wound with interventions     Assess wounds q shift    Discharge Planning:  Pursuing SNF bed, wife vry involved and supportive but can not provide the care he needs at this time.    Team Discussion: Patient was admitted post C5-7 ACDF secondary to Traumatic incomplete quadriplegia.Patient limited by pain, spasticity, weakness of bilateral shoulder and poor balance of bilateral lower extremities.  Patient on target to meet rehab goals: no, Patient requires total assist with ADLs. Patient requires Max +1 with transfers. Patient requires supervision to min A using a PWC with mobility. Overall goals at discharge are set for mod A w/c level.   *See Care Plan and progress notes for long and short-term goals.   Revisions to Treatment Plan:  Prevalon boots Wedge for turning  Turn q 2 hours Wound plan initiated  Foam dressings on both heels Family Conference Downgraded goals Slide board Stedy Foley catheter Power wheelchair  Teaching Needs: Safety, medications, transfers, toileting, foley care, bowel program, etc   Current Barriers to Discharge: Decreased caregiver support, Neurogenic bowel and bladder, and Wound care  Possible Resolutions to Barriers: Family Education SNF  Medical Summary Current Status: SPasticity- tolerarting dantrolene - has foley- for neurogenic bladder-stage II pressure ulcer on coccyx and DTI R heel; bowel program  Barriers to Discharge: Behavior/Mood;Complicated Wound;Other (comments);Spasticity;Self-care education;Neurogenic Bowel & Bladder;Incontinence;Medical stability;Weight bearing restrictions  Barriers to Discharge Comments: SNF placement-limited recovery- not progressing rapidly- depressed overall about function; severe spasticity on R side; pain RLE- mainly due to spasticity, legally blind- Possible Resolutions to [employer] Focus:  needs SNF- spasticity is slightly improving; is standing- d/c SNF   Continued Need for Acute Rehabilitation Level of Care: The patient requires daily medical management by a physician with specialized training in physical medicine and rehabilitation for the following reasons: Direction of a multidisciplinary physical rehabilitation program to maximize functional independence : Yes Medical management of patient stability for increased activity during participation in an intensive rehabilitation regime.: Yes Analysis of laboratory values and/or radiology reports with any subsequent need for medication adjustment and/or medical intervention. : Yes   I attest that I was present, lead the team conference, and concur with the assessment and plan of the team.   Darcy Cordner Gayo 11/16/2023, 1132 am

## 2023-11-18 LAB — CBC WITH DIFFERENTIAL/PLATELET
Abs Immature Granulocytes: 0.16 10*3/uL — ABNORMAL HIGH (ref 0.00–0.07)
Basophils Absolute: 0.1 10*3/uL (ref 0.0–0.1)
Basophils Relative: 1 %
Eosinophils Absolute: 0.3 10*3/uL (ref 0.0–0.5)
Eosinophils Relative: 3 %
HCT: 26.4 % — ABNORMAL LOW (ref 39.0–52.0)
Hemoglobin: 8.5 g/dL — ABNORMAL LOW (ref 13.0–17.0)
Immature Granulocytes: 2 %
Lymphocytes Relative: 14 %
Lymphs Abs: 1.2 10*3/uL (ref 0.7–4.0)
MCH: 29.2 pg (ref 26.0–34.0)
MCHC: 32.2 g/dL (ref 30.0–36.0)
MCV: 90.7 fL (ref 80.0–100.0)
Monocytes Absolute: 0.7 10*3/uL (ref 0.1–1.0)
Monocytes Relative: 8 %
Neutro Abs: 6.4 10*3/uL (ref 1.7–7.7)
Neutrophils Relative %: 72 %
Platelets: 409 10*3/uL — ABNORMAL HIGH (ref 150–400)
RBC: 2.91 MIL/uL — ABNORMAL LOW (ref 4.22–5.81)
RDW: 14.4 % (ref 11.5–15.5)
WBC: 8.8 10*3/uL (ref 4.0–10.5)
nRBC: 0 % (ref 0.0–0.2)

## 2023-11-18 LAB — BASIC METABOLIC PANEL WITH GFR
Anion gap: 11 (ref 5–15)
BUN: 33 mg/dL — ABNORMAL HIGH (ref 8–23)
CO2: 26 mmol/L (ref 22–32)
Calcium: 8.6 mg/dL — ABNORMAL LOW (ref 8.9–10.3)
Chloride: 103 mmol/L (ref 98–111)
Creatinine, Ser: 1.45 mg/dL — ABNORMAL HIGH (ref 0.61–1.24)
GFR, Estimated: 48 mL/min — ABNORMAL LOW (ref >60)
Glucose, Bld: 103 mg/dL — ABNORMAL HIGH (ref 70–99)
Potassium: 3.7 mmol/L (ref 3.5–5.1)
Sodium: 140 mmol/L (ref 135–145)

## 2023-11-18 LAB — GLUCOSE, CAPILLARY
Glucose-Capillary: 102 mg/dL — ABNORMAL HIGH (ref 70–99)
Glucose-Capillary: 131 mg/dL — ABNORMAL HIGH (ref 70–99)
Glucose-Capillary: 155 mg/dL — ABNORMAL HIGH (ref 70–99)
Glucose-Capillary: 186 mg/dL — ABNORMAL HIGH (ref 70–99)

## 2023-11-18 NOTE — Progress Notes (Signed)
 Occupational Therapy Session Note  Patient Details  Name: Christian Bailey MRN: [id number] Date of Birth: 08-27-1940  Today's Date: 11/18/2023 OT Individual Time: 1030-1100 OT Individual Time Calculation (min): 30 min    Short Term Goals: Week 5:  OT Short Term Goal 1 (Week 5): Pt will complete bathing at Mod A with AE as necessary OT Short Term Goal 2 (Week 5): Pt will complete bed mobility at Mod A in order to prepare for ADLs OT Short Term Goal 3 (Week 5): Pt will complete sit to stand with Mod A consistently throughout sessions within stedy  Skilled Therapeutic Interventions/Progress Updates:      Therapy Documentation Precautions:  Precautions Precautions: Fall, Cervical Precaution/Restrictions Comments: decreased recall of precautions, not able to verbalize any when prompted Required Braces or Orthoses: Cervical Brace Cervical Brace: Soft collar Restrictions Weight Bearing Restrictions Per Provider Order: No General: "I slept great last night" Pt seated in W/C upon OT arrival, agreeable to OT.  Pain: no pain reported  Exercises: OT providing skilled intervention with the following exercises in order to improve functional activity, strength and endurance in BLE to prepare for ADLs such as bathing. Pt completed the following exercises in seated position with no noted LOB/SOB and 3x10 repetitions on each exercise: -knee extension with 1.5 ankle weight -seated marches with 1.5 ankle weight OT providing AAROM with increased ROM for BLE and tone management   Pt seated in W/C at end of session with W/C    Therapy/Group: Individual Therapy  Nila Barth, OTD, OTR/L 11/18/2023, 12:52 PM

## 2023-11-18 NOTE — Progress Notes (Signed)
 Suppository given per bowel program instructions. Oncoming nurse to follow up.

## 2023-11-18 NOTE — Progress Notes (Signed)
 Occupational Therapy Session Note  Patient Details  Name: Christian Bailey MRN: [id number] Date of Birth: 1941-02-22  Today's Date: 11/19/2023 OT Individual Time: 1045-1200 OT Individual Time Calculation (min): 75 min    Short Term Goals: Week 5:  OT Short Term Goal 1 (Week 5): Pt will complete bathing at Mod A with AE as necessary OT Short Term Goal 2 (Week 5): Pt will complete bed mobility at Mod A in order to prepare for ADLs OT Short Term Goal 3 (Week 5): Pt will complete sit to stand with Mod A consistently throughout sessions within stedy  Skilled Therapeutic Interventions/Progress Updates:     Patient agreeable to participate in OT session. Reports 0/10 pain level.   Patient participated in skilled OT session focusing on ADL re-training and BUE strength and coordination.   Pt completed grooming while seated at sink. Required assistance to maneuver powered WC effectively up to sink. Set-up required for grooming including oral care, washing face, and shaving. Due to decreased hand strength, pt required assistance to empty electric razor shavings in order to shave efficiently.  Pt navigated in powered WC from room to/from Day room with SBA requiring VC for safety awareness when in crowded hallway only. Due to visual impairment, pt reports that he has difficulty seeing controllers on WC. Use of yellow tape to mark button for chair functions (II) and for navigating back to home screen (I) may be effective to increase his independence with WC mobility.   Pt participated in theraputty tasks while focusing on UE and hand strengthening. Tan putty provided. Pt utilized BUE to flatten, roll, 3 point pinch, lateral pinch and squeeze/release. Pt located 6/6 orange beads. Rest breaks were taken as needed d/t hand/arm fatigue.   Utilizing yellow theraband, pt completed BUE horizontal abduction, LUE PNF pattern D2, 10X, 1 set. VC and visual demonstration provided for proper form and technique.     Therapy Documentation Precautions:  Precautions Precautions: Fall, Cervical Precaution/Restrictions Comments: decreased recall of precautions, not able to verbalize any when prompted Required Braces or Orthoses: Cervical Brace Cervical Brace: Soft collar Restrictions Weight Bearing Restrictions Per Provider Order: No  Therapy/Group: Individual Therapy  Carollee Circle, OTR/L,CBIS  Supplemental OT - [facility] and [facility] Secure Chat Preferred   11/19/2023, 6:49 AM

## 2023-11-18 NOTE — Progress Notes (Signed)
 PROGRESS NOTE   Subjective/Complaints:  Pt reports had BM with bowel program- said had BM and nurse said did as well, but not documented.   Thinks starting to loosen up on RLE.  Got to standing position with therapy yesterday- but appears it occurred in standing frame- did for 2 minutes at a time  ROS:   Pt denies SOB, abd pain, CP, N/V/C/D, and vision changes    + Heel discomfort- on R side- still evident and buttock/coccyx pain- slightly better + nausea/vomiting- -hasn't recurred   Objective:   No results found.    Recent Labs    11/16/23 0549 11/18/23 0643  WBC 11.0* 8.8  HGB 8.8* 8.5*  HCT 27.7* 26.4*  PLT 357 409*       Recent Labs    11/18/23 0643  NA 140  K 3.7  CL 103  CO2 26  GLUCOSE 103*  BUN 33*  CREATININE 1.45*  CALCIUM  8.6*        Intake/Output Summary (Last 24 hours) at 11/18/2023 7829 Last data filed at 11/18/2023 5621 Gross per 24 hour  Intake 476 ml  Output 1300 ml  Net -824 ml     Pressure Injury 10/28/23 Sacrum Medial Stage 1 -  Intact skin with non-blanchable redness of a localized area usually over a bony prominence. STAGE 1 ON SACRUM (Active)  10/28/23 1333  Location: Sacrum  Location Orientation: Medial  Staging: Stage 1 -  Intact skin with non-blanchable redness of a localized area usually over a bony prominence.  Wound Description (Comments): STAGE 1 ON SACRUM  Present on Admission: No     Pressure Injury 10/28/23 Heel Right Deep Tissue Pressure Injury - Purple or maroon localized area of discolored intact skin or blood-filled blister due to damage of underlying soft tissue from pressure and/or shear. DTI on bottom of heel (Active)  10/28/23 1820  Location: Heel  Location Orientation: Right  Staging: Deep Tissue Pressure Injury - Purple or maroon localized area of discolored intact skin or blood-filled blister due to damage of underlying soft tissue from  pressure and/or shear.  Wound Description (Comments): DTI on bottom of heel  Present on Admission: No     Pressure Injury 11/16/23 Coccyx Right;Upper Stage 2 -  Partial thickness loss of dermis presenting as a shallow open injury with a red, pink wound bed without slough. (Active)  11/16/23 1458  Location: Coccyx  Location Orientation: Right;Upper  Staging: Stage 2 -  Partial thickness loss of dermis presenting as a shallow open injury with a red, pink wound bed without slough.  Wound Description (Comments):   Present on Admission: No    Physical Exam: Vital Signs Blood pressure (!) 106/55, pulse 92, temperature 98.3 F (36.8 C), temperature source Oral, resp. rate 16, height 5\' 4"  (1.626 m), weight 60.6 kg, SpO2 99%.       General: awake, alert, appropriate, woke him up; supine, but turned slightly to L with wedge, NAD HENT: conjugate gaze; oropharynx moist CV: regular rate and rhythm; rate in 90's; no JVD Pulmonary: CTA B/L; no W/R/R- good air movement GI: soft, NT, ND, (+)BS Psychiatric: appropriate Neurological: Ox3  MAS of 1+ to 2  in RLE- no clonus GU- light yellow amber urine-a little darker this AM Skin: Heels covered with foam dressings B/L- R heel DTI and Sacral Stage II  Wearing prevalons B/L and propped with wedge to L side   Prior exam MSK: Ue's 4/5 B/L throughout LE's- 2-/5 B/L-in HF/KE- 4-/5 DF/PF but RLE weaker than LLE Spasticity- MAS of 1+ to 2 in Ue's this Am and 3 in RLE- MAS worse this AM- 2-3 throughout Neurologic: Cranial nerves II through XII intact, motor strength is 4/5 in bilateral  bicep, tricep, grip,and finger ext and hand intrinsics, 3- RIght delt and 4/5 left delt, 3- bilateral  hip flexor, 3- RIght and 4- left knee extensors, 4/5 bilateral ankle dorsiflexor and plantar flexor Sensory exam normal sensation to light touch and proprioception in bilateral upper and lower extremities Tone- 3+ a bilateral biceps triceps and BR, + Hoffman's  bilateral 2/5 RIght 3+ left knee Also MAS 3 tone in RIght Knee flexors and extensors, MAS 1 at the left knee Clonus at bilateral ankles  Musculoskeletal: Full range of motion in all 4 extremities. No joint swelling      Assessment/Plan: 1. Functional deficits which require 3+ hours per day of interdisciplinary therapy in a comprehensive inpatient rehab setting. Physiatrist is providing close team supervision and 24 hour management of active medical problems listed below. Physiatrist and rehab team continue to assess barriers to discharge/monitor patient progress toward functional and medical goals  Care Tool:  Bathing              Bathing assist Assist Level: Maximal Assistance - Patient 24 - 49%     Upper Body Dressing/Undressing Upper body dressing   What is the patient wearing?: Pull over shirt    Upper body assist Assist Level: Minimal Assistance - Patient > 75%    Lower Body Dressing/Undressing Lower body dressing      What is the patient wearing?: Underwear/pull up, Pants     Lower body assist Assist for lower body dressing: Dependent - Patient 0%     Toileting Toileting    Toileting assist Assist for toileting: Dependent - Patient 0%     Transfers Chair/bed transfer  Transfers assist     Chair/bed transfer assist level: Maximal Assistance - Patient 25 - 49%     Locomotion Ambulation   Ambulation assist   Ambulation activity did not occur: Safety/medical concerns  Assist level: 2 helpers Assistive device: [name] Max distance: 10'   Walk 10 feet activity   Assist  Walk 10 feet activity did not occur: Safety/medical concerns  Assist level: 2 helpers Assistive device: Walker-Eva   Walk 50 feet activity   Assist Walk 50 feet with 2 turns activity did not occur: Safety/medical concerns         Walk 150 feet activity   Assist Walk 150 feet activity did not occur: Safety/medical concerns         Walk 10 feet on uneven  surface  activity   Assist Walk 10 feet on uneven surfaces activity did not occur: Safety/medical concerns         Wheelchair     Assist Is the patient using a wheelchair?: Yes Type of Wheelchair: Power    Wheelchair assist level: Supervision/Verbal cueing Max wheelchair distance: 150'    Wheelchair 50 feet with 2 turns activity    Assist        Assist Level: Supervision/Verbal cueing   Wheelchair 150 feet activity     Assist  Assist Level: Supervision/Verbal cueing   Blood pressure (!) 106/55, pulse 92, temperature 98.3 F (36.8 C), temperature source Oral, resp. rate 16, height 5\' 4"  (1.626 m), weight 60.6 kg, SpO2 99%.  Medical Problem List and Plan: 1. 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  Antithrombotics: -DVT/anticoagulation:  Mechanical: Sequential compression devices, below knee Bilateral lower extremities 4/23- will see if Dr Ellery Guthrie will allow us  to start Lovenox - he said yes, so will start. 30mg  daily. 5/14- when leaves, will go on Eliquis for a total of 3 months 2.5 mg BID             -antiplatelet therapy: N/A 3. Pain Management: tylenol  prn. Voltaren  QID, lyrica  50mg  BID, flexeril  PRN 5/8- pain better since started baclofen - didn't need tramadol  5/9- will add Dantrolene  50 mg due to Allergy with Baclofen  at bedtime and see if tolerates- if so, will con't to increase the dose to help spasticity and reduce chance of sedation/side effects  -11/07/23 very delirious yesterday per pt, will stop baclofen  and leave Dantrolene  for now; monitor 5/19-  pain doing well as long as turned and gets prevalon boots 5/20- pt reports pain still on backside and R heel 5/21- pain meds working -w hen takes Tylenol   5/22- Tylenol  works for sacral and R heel pain 4. Mood/Behavior/Sleep: LCSW to follow up for evaluation and support when appropriate --Delirium precautions--His mental status has cleared but will monitor for worsening with transfer to new unit .pt has not required prn meds for agitation in several days  Monitor sleep wake cycle             -antipsychotic agents: Change haldol  q 6 hrs prn to Seroquel  12.5mg  tid prn  -Restoril  7.5mg  nightly, cymbalta  20mg  daily 4/23-4/25 no delirium so far 4/29- still confused this AM-  5/9 Aox3 this AM -11/07/23 delirious yesterday, stopped baclofen  as above. 5/14- resolved  5. Neuropsych/cognition: This patient is usually  capable of making decisions on his own behalf. 6. Skin/Wound Care: Routine pressure relief measures.  5/20- Has Stage II on backside- now has minor breakdown per nursing- and R heel DTI- has prevalons- pt to be turned q2 hours- couldn't tolerate air bed.   5/21- turning with new wedge we got him  5/22- pt reports with new wedge, less discomfort on sacrum- however R DTI still uncomfortable constantly- will d/w nursing about turning him maybe MORE to the side- so heels not down all the time? 7. Fluids/Electrolytes/Nutrition:  Monitor I/O. Routine labs. Continue vitamins/supplements. 8. 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 Willye Harvey, per note, leave in place for 7-10d, Consider resuming In-N-Out catheterization for management of his neurogenic bladder  at that time.  Recommend using a coud catheter for intermittent catheterization (when resumed).  5/7- pt and wife have decided they don't want to remove foley - they don't want ot go back to in/out caths-  since wasn't emptying before- they want a chronic foley- when offered to remove foley just for rehab, to see if he gets return, he is scared will get bleeding again, due to false passage already had and declined 5/12 Foley draining clear yellow urine 5/16- wife still wants and if going to SNF, will need to keep 5/20- pt wants to keep Foley- doesn't want to go back to in/out caths 10.  Diabetes- A1c 8.0-  with peripheral neuropathy by hx only on Jardiance at home, use SSI in the hospital  -10/23/23 CBGs variable but likely d/t steroids; cont SSI for now 4/29- CBG's very labile in last 24 hours 99-383- - it concerns me to start Jardiance since of Foley and risk of UTI's- also for starting Tradjenta  since BG was 99 last night- don't want to drop BG too much 4/30- Will add Tradjenta  since BG's still running 150-242- will monitor closely for CBG hypoglycemia-   10/30/23 CBGs looking great!  -10/31/23 CBGs a little higher the last day, but monitor for trend  5/5- Cbgs all less than 200- con't to monitor  5/6- CBGs 130s- 180s- 5/7-5/9 CBG's 128 to 220- had snack yesterday- still snacking in afternoon -5/10-11/25 CBGs great except for afternoon d/t snacks. Leave meds alone for now -5/12 fair glucose control, continue current regimen and monitor. 5/13- 5/15- great control except for afternoon- snacking- will d/w pt 5/16- Cbgs jumping higher- if this continues to occur, will need other meds for CBG's -11/13/23 CBGs so variable but mostly it's the afternoon/evening values; will start carb modified diet as he's on regular diet right now. If not, might need to add other med? -11/14/23 CBGs much better overnight; asked that he limit his sugar (has regular cola next to his bed), hopefully diet alone will control his sugars better;  pt aware and agreeable.  5/19-5/22 Cbgs looking much better- con't regimen CBG (last 3)  Recent Labs    11/17/23 1636 11/17/23 2041 11/18/23 0645  GLUCAP 164* 138* 102*     11.  Spasticity affecting LEs> UE and R>L , aggressive ROM  with PT, consider oral meds  4/22- starting baclofen  5 mg TID- with his Cr 1.50- has been running 1.68 to 2.30  4/24- will not increase baclofen  due to Renal issues 5/9- will add Dantrolene  50 mg at bedtime to reduce chance of spasticity but also reduce chance of side effects/sedation- - will monitor -11/07/23 stopped baclofen  d/t delirium, cont dantrolene  5/20- increased Dantrolene  to 50 mg TID- no side effects at this time 12.  PAD s/p SFA and Iliac stenting, continue metoprolol  50mg  daily  13. CKD3b? With AKI- AKI has resolved  4/28- Cr 1.63- doing better 4/29- Cr 1.59 after 12+ hours of IVF's- and BUN very slightly down to 70 from 72-   5/1- Cr down to 1.43 and BUN down to 53  5/5- Cr 1.51 and BUN down to 37- drinking more!  5/8- Cr 1.8- up from 1.51- but BUN 34 down from 37- will push fluids  5/12 creatinine and BUN much lower at 1.33/26-improved  5/15- Cr 1.68 and BUN 30 this AM- BUN improving- but Cr bounces  5/19- BUN 30 and Cr 1.64  5/22- BUN 33 and Cr 1.45 14. Neurogenic bowel: continue miralax  32g daily, senokot s 2  tabs qAM  5/1- LBM this Am- was accident- might need bowel program 5/20- Bowel program yielded results without dig stim last night- feeling good- con't regimen 15. HLD: crestor  20mg  nightly 16. Delirium/Confusion 4/30- pt Ox3 this AM- very with it- likely flexeril  based on chart review- given for pain since on no pain meds- will keep off Flexeril  and monitor Sx's  5/5- Sx's resolved  -11/07/23 SEE ABOVE 17. Hx of HTN with severe hypotension 5/6-5/11 BP doing better- con't regimen 5/12 orthostatic hypotension noted this morning, bolus IVF 500cc. 5/13- Will increase Midodrine  to 7.5 mg TID 5/14- 5/15- BP slightly better actually  has 1 episode of 155 systolic so wait to increase Midodrine  5/16-5/20- BP doing better- only one episode where BP 110's- con't to monitor 5/22- BP running low 100's systolic when laying down- will d/w therapy if having drops in BP still - if so will increase midodrine  Vitals:   11/14/23 1340 11/14/23 1934 11/15/23 0510 11/15/23 1334  BP: (!) 115/59 128/74 (!) 134/59 122/60   11/15/23 1932 11/16/23 0346 11/16/23 1307 11/16/23 1928  BP: 112/62 (!) 112/56 (!) 118/59 (!) 114/51   11/17/23 0611 11/17/23 1252 11/17/23 1940 11/18/23 0623  BP: (!) 102/55 (!) 117/59 (!) 103/54 (!) 106/55     18. Atelectasis 4/30- sounds coarse- asked pt to use ICS regularly- is at bedside- also described "spitting things up"_ had "spit cup"- at bedside-  had barely anything in it. 5/5- not spitting up anymore  19. R heel DTI and Stage I on sacrum 5/2- is new- ordered prevalon boots as well as low air loss mattress and needs to be turned q2 hours- placed order  5/8- pt not able to tolerate air mattress- turn q2 hours and prevalons for Heel DTI- d/w nursing- just removed this AM 5/12 patient asked for device to keep pressure off his heel, Prevalon boots at bedside. Will chek with nursing regarding use of these boots at night.  20. Spasticity  5/5- will d/w wife tomorrow about restarting Baclofen  vs Dantrolene .   5/6- attempted to call wife again x2- cannot get her 5/7- was able to see wife yesterday and discussed- we will retry Pt on Baclofen  5 mg TID for now- not a higher dose due to renal issues-  and monitor for confusion/delirium- if gets confused, will try Dantrolene .   5/8- no confusion and RLE much less pain- so think it's working 5/9- will add Dantrolene  as above 5/14- Increased dantrolene  to 50 mg BID 5/20- increase Dantrolene  to 50 mg TID- is helping some and no side effects 5/22- feels like spasticity is improving mildly-  21. Severe malnutrition  5/5- per Dietitian- will do supplements 22. Mild  tachycardia 5/8- per pt, has ALWAYS had tachycardia- has been worked up- and nothing found- usually 90s-100's per pt- which is the same here- will monitor 23. Ileus??? 5/13- reached out to Radiology about pt's questionable ileus- not clear in Radiology report  5/14- doesn't have ileus- but full of gas- started Simethicone  QID  5/20- make Gas-X prn per pt request 24. Nasal congestion with mild cough 5/15- probably due to post nasal drip- will start Flonase  2 sprays each nare daily.   5/16- says nasal congestion much better- won't need Flonase - will make prn 25. Leukocytosis  5/19- no signs of infection, no fever, no confusion- will recheck in AM unless her feels ill today.   5/20- WBC down to 11k- will con't to monitor- and recheck Thursday  5/22- WBC down to 8.8- no  fever or other Sx's   I spent a total of 38   minutes on total care today- >50% coordination of care- due to  D/w therapy and nursing about OH as well as bowels and turning as well as R heel pain.    LOS: 31 days A FACE TO FACE EVALUATION WAS PERFORMED  Christian Bailey 11/18/2023, 8:32 AM

## 2023-11-18 NOTE — Progress Notes (Signed)
 Physical Therapy Session Note  Patient Details  Name: Christian Bailey MRN: [id number] Date of Birth: February 25, 1941  Today's Date: 11/18/2023 PT Individual Time: 4742-5956 PT Individual Time Calculation (min): 69 min   Short Term Goals: Week 3:  PT Short Term Goal 1 (Week 3): Pt will perform bed mobility with mod A PT Short Term Goal 1 - Progress (Week 3): Partly met PT Short Term Goal 2 (Week 3): Pt will progress transfers to max A PT Short Term Goal 2 - Progress (Week 3): Met PT Short Term Goal 3 (Week 3): Pt will be able to tolerate x 5 min in standing for WB/postural control retraining/tone management PT Short Term Goal 3 - Progress (Week 3): Met Week 4:  PT Short Term Goal 1 (Week 4): Pt will be able to progress lateral leans for slideboard placement with max A PT Short Term Goal 2 (Week 4): Pt will be able to perform rolling with bed rail features with with mod assist PT Short Term Goal 3 (Week 4): Pt will be able to tolerate WB in standing x 10 min for tone management and NMR  Skilled Therapeutic Interventions/Progress Updates:    Pt presents in bed asking about redonning his PRAFO's. Let pt know we are going to get up and get shoes on and OOB. Donned tedhose total A and pants as well, pt able to initiate and assist movement with BLE to thread pants. Performed bridging for getting pants up but unable to fully clear despite a couple of attempts so rolled with CGA to the R and min A to the L to fully pull up. NMR for supine BLE stretching for tone management with improvement in overall mobility (still some increased tone in RLE noted but less) and bridging x 10 reps for muscle activation and carryover to moblity. Supine to sit from sidelying position with mod/max A with facilitation at trunk for activation. Seated EOB with supervision statically. Then pt performed lateral lean with max assist for slideboard placement using rail for support. Min to mod assist for slideboard transfer to w/c with pt  performing scooting on board with min assist and then mod A to finish scooting once on cushion. +2 needed to fully get back. Reviewed pressure relief and discussed use of timer but pt states he does a good job of paying attention to the clock. Issued timer to have "just in case". Pt able to manipulate buttons with cues. Reviewed again at end of session.   Continued NMR in gym in parallel bars for sit <> stands x 3 trials with rest breaks in between with mod to max +2 needed for transition, once up and if able to maintain trunk erect, could maintain with mod assist with B knees blocked. Decreased ability to transition to seated position eccentrically noted. Focused on core strengthening and anterior weightshift to carryover to mobility seated in PWC with lightweight ball with reaching below R and L knee with focus on activation and exhale on exertion x 10 reps each side and then one extra to practice breath control which pt reports helped. Required mod to initiate, min for follow through and decreased control to return back to chair.   Pt drove PWC with supervision on unit to and from therapy. Still prefers therapist to maneuver in tightspaces in the room due to visual impairments. Set up with all needs in reach.   Therapy Documentation Precautions:  Precautions Precautions: Fall, Cervical Precaution/Restrictions Comments: decreased recall of precautions, not able to verbalize any  when prompted Required Braces or Orthoses: Cervical Brace Cervical Brace: Soft collar Restrictions Weight Bearing Restrictions Per Provider Order: No  Pain: 3/10 pain in legs - premedicated per report. Stretching also helped with RLE.    Therapy/Group: Individual Therapy  Gita Lamb [name], PT, DPT, CBIS  11/18/2023, 10:35 AM

## 2023-11-18 NOTE — Progress Notes (Signed)
 Physical Therapy Session Note  Patient Details  Name: Christian Bailey MRN: [id number] Date of Birth: [birth year]  Today's Date: 11/18/2023 PT Individual Time: 0865-7846 PT Individual Time Calculation (min): 40 min   Short Term Goals: Week 4:  PT Short Term Goal 1 (Week 4): Pt will be able to progress lateral leans for slideboard placement with max A PT Short Term Goal 2 (Week 4): Pt will be able to perform rolling with bed rail features with with mod assist PT Short Term Goal 3 (Week 4): Pt will be able to tolerate WB in standing x 10 min for tone management and NMR  Skilled Therapeutic Interventions/Progress Updates: Pt presented in PWC sleeping but easily aroused and agreeable to therapy. Pt states some mild pain in RLE unrated with no intervention requested during session. Pt navigated to day room with supervision. PTA performed BLE stretching/ROM for tone management.  Pt set up in standing frame total A. Participated in standing frame x 10 min with pillow on platform for BUE support. Pt indicated no s/s of OH and stated no significant increased pain in BLE in standing. Pt encouraged to lean anteriorly during transition back to sitting and pt was able to provide a little assist pushing through BLE to scoot posteriorly in PWC. Pt navigated back to room and required total A for Slide board set up. Pt completed Slide board transfer to bed with heavy modA however this therapist was able to feel pt initiate and push through BLE to facilitate scoot. Pt was also able to complete lateral lean to R with PTA assisting with BLE. Pt required maxA to reposition trunk. TED hose removed and PRAFOs placed bilaterally. Pt left in bed at end of session with NT present for vitals and current needs met.      Therapy Documentation Precautions:  Precautions Precautions: Fall, Cervical Precaution/Restrictions Comments: decreased recall of precautions, not able to verbalize any when prompted Required Braces or Orthoses:  Cervical Brace Cervical Brace: Soft collar Restrictions Weight Bearing Restrictions Per Provider Order: No General:   Vital Signs: Therapy Vitals Pulse Rate: 78 Resp: 16 BP: (!) 126/55 Patient Position (if appropriate): Lying Oxygen Therapy SpO2: 98 % O2 Device: Room Air:      Therapy/Group: Individual Therapy  Lyndel Sarate 11/18/2023, 2:53 PM

## 2023-11-18 NOTE — Progress Notes (Signed)
 Occupational Therapy Session Note  Patient Details  Name: Christian Bailey MRN: [id number] Date of Birth: May 05, 1941  Today's Date: 11/18/2023 OT Group Time: 1100-1154 OT Group Time Calculation (min): 54 min   Short Term Goals: Week 5:  OT Short Term Goal 1 (Week 5): Pt will complete bathing at Mod A with AE as necessary OT Short Term Goal 2 (Week 5): Pt will complete bed mobility at Mod A in order to prepare for ADLs OT Short Term Goal 3 (Week 5): Pt will complete sit to stand with Mod A consistently throughout sessions within stedy  Skilled Therapeutic Interventions/Progress Updates:      Therapy Documentation Precautions:  Precautions Precautions: Fall, Cervical Precaution/Restrictions Comments: decreased recall of precautions, not able to verbalize any when prompted Required Braces or Orthoses: Cervical Brace Cervical Brace: Soft collar Restrictions Weight Bearing Restrictions Per Provider Order: No Group Description: UE group:OT providing skilled intervention with the following exercise circuit in order to improve functional activity, strength and endurance to prepare for ADLs.  Pt completed the following exercises in seated position with no noted LOB/SOB and 5:00 min on each exercise: -bicep curls -triceps extensions -rows -chest press    Individual level documentation: Patient completed group from sitting level with red theraband and 2# dowel rod Patientt needed demo to complete various exercises with cues for ROM. Patient able to come up with modifications during group if necessary.   Pain: 0/10   Therapy/Group: Group Therapy  Nila Barth, OTD, OTR/L 11/18/2023, 1:32 PM

## 2023-11-18 NOTE — Plan of Care (Signed)
  Problem: Consults Goal: RH GENERAL PATIENT EDUCATION Description: See Patient Education module for education specifics. Outcome: Progressing   Problem: RH BOWEL ELIMINATION Goal: RH STG MANAGE BOWEL WITH ASSISTANCE Description: STG Manage Bowel with mod I  Assistance. Outcome: Progressing   Problem: RH BOWEL ELIMINATION Goal: RH STG MANAGE BOWEL W/MEDICATION W/ASSISTANCE Description: STG Manage Bowel with Medication with mod I Assistance. Outcome: Progressing   Problem: RH BLADDER ELIMINATION Goal: RH STG MANAGE BLADDER WITH ASSISTANCE Description: STG Manage Bladder With toileting / mod I Assistance Outcome: Progressing   Problem: RH BLADDER ELIMINATION Goal: RH STG MANAGE BLADDER WITH MEDICATION WITH ASSISTANCE Description: STG Manage Bladder With Medication With mod I Assistance. Outcome: Progressing   Problem: RH SAFETY Goal: RH STG ADHERE TO SAFETY PRECAUTIONS W/ASSISTANCE/DEVICE Description: STG Adhere to Safety Precautions With cues Assistance/Device. Outcome: Progressing

## 2023-11-18 NOTE — Group Note (Unsigned)
 Patient Details Name: Lonza Shimabukuro MRN: [id number] DOB: 06/07/1941 Today's Date: 11/18/2023  Time Calculation:        Group Description: BUE Therex Group: Pt participated in group session with a focus on BUE strength and endurance to facilitate improved activity tolerance and strength for higher level BADLs and functional mobility tasks.    Individual level documentation: Pt first in engaged in {Seated/standing:28274} warm up where pt completed ***.  Next, pt completed {Seated/standing:28274} {UE Exercise:28275}. Patient able to complete *** reps.  Ended session with guide deep breathing for 3 mins. Discussed benefits of deep breathing to manage stress and pain. Pt *** back to room with ***. Pt left *** with all needs within reach and bed alarm/chair alarm activated.   Pain: Pain Assessment Pain Scale: 0-10 Pain Score: 5  Pain Location: Leg Pain Intervention(s): Medication (See eMAR)  Precautions:     Gwyn Mehring 11/18/2023, 11:37 AM

## 2023-11-18 NOTE — Progress Notes (Signed)
         Wound Plan   Wounds present: Pressure Injury 11/16/23 Coccyx Right;Upper Stage 2 - Partial thickness loss of dermis presenting as a shallow open injury with a red, pink wound bed without slough.   Interventions:   WOC consult Pro source plus 30 cc daily Ascorbic acid  500 mg daily at bedtime Ensure feeding supplement 2x daily between meals  Gerharts butt cream 3x daily Multivitamin 1 tab daily 3 step nursing care regimen Pressure redistribution chair padurn patient q 2 hours Wound care: cleanse with saline . Pat dry apply silicone dressing Wouind care: place aquacel on wound bed butt area cover with foam Wedge in bed  to help with turning    Braden Score: 15  Sensory: 3  Moisture: 4  Activity: 2  Mobility: 2  Nutrition: 2  Friction: 2  Contributors: Retail banker MSN, RN, [employer], CWCN-AP, CNS  Rachel Budds BSN, RN, [employer], WOC  Pam Love , PAC Dr. Claudetta Cuba, BSN, RN-C, [facility]

## 2023-11-19 ENCOUNTER — Other Ambulatory Visit: Payer: Self-pay | Admitting: Surgery

## 2023-11-19 DIAGNOSIS — Z95828 Presence of other vascular implants and grafts: Secondary | ICD-10-CM

## 2023-11-19 DIAGNOSIS — I739 Peripheral vascular disease, unspecified: Secondary | ICD-10-CM

## 2023-11-19 LAB — GLUCOSE, CAPILLARY
Glucose-Capillary: 101 mg/dL — ABNORMAL HIGH (ref 70–99)
Glucose-Capillary: 181 mg/dL — ABNORMAL HIGH (ref 70–99)
Glucose-Capillary: 156 mg/dL — ABNORMAL HIGH (ref 70–99)
Glucose-Capillary: 132 mg/dL — ABNORMAL HIGH (ref 70–99)

## 2023-11-19 NOTE — Progress Notes (Signed)
 PROGRESS NOTE   Subjective/Complaints:  Pt reports standing frame for 5-10 minutes almost completely resolved pain in legs overnight- just standing in standing frame! He said didn't even require his tylenol  for pain.  Also R heel was hurting and someone "made a gap" on his prevalon and pain almost went away.   Large BM last night with bowel program- no f/u note  ROS:    Pt denies SOB, abd pain, CP, N/V/C/D, and vision changes    + Heel discomfort- on R side- still evident and buttock/coccyx pain- slightly better + nausea/vomiting- -hasn't recurred   Objective:   No results found.    Recent Labs    11/18/23 0643  WBC 8.8  HGB 8.5*  HCT 26.4*  PLT 409*       Recent Labs    11/18/23 0643  NA 140  K 3.7  CL 103  CO2 26  GLUCOSE 103*  BUN 33*  CREATININE 1.45*  CALCIUM  8.6*        Intake/Output Summary (Last 24 hours) at 11/19/2023 0752 Last data filed at 11/19/2023 0453 Gross per 24 hour  Intake 240 ml  Output 2150 ml  Net -1910 ml     Pressure Injury 10/28/23 Sacrum Medial Stage 2 -  Partial thickness loss of dermis presenting as a shallow open injury with a red, pink wound bed without slough. STAGE 1 ON SACRUM (Active)  10/28/23 1333  Location: Sacrum  Location Orientation: Medial  Staging: Stage 2 -  Partial thickness loss of dermis presenting as a shallow open injury with a red, pink wound bed without slough.  Wound Description (Comments): STAGE 1 ON SACRUM  Present on Admission: No     Pressure Injury 10/28/23 Heel Right Deep Tissue Pressure Injury - Purple or maroon localized area of discolored intact skin or blood-filled blister due to damage of underlying soft tissue from pressure and/or shear. DTI on bottom of heel (Active)  10/28/23 1820  Location: Heel  Location Orientation: Right  Staging: Deep Tissue Pressure Injury - Purple or maroon localized area of discolored intact skin or  blood-filled blister due to damage of underlying soft tissue from pressure and/or shear.  Wound Description (Comments): DTI on bottom of heel  Present on Admission: No    Physical Exam: Vital Signs Blood pressure (!) 117/56, pulse 97, temperature 98.1 F (36.7 C), temperature source Oral, resp. rate 17, height 5\' 4"  (1.626 m), weight 60.6 kg, SpO2 96%.        General: awake, alert, appropriate, sitting sup slightly in bed; propped with wedge to R, NAD HENT: conjugate gaze; oropharynx moist CV: regular rate and rhythm- rate in 90's; no JVD Pulmonary: CTA B/L; no W/R/R- good air movement GI: soft, NT, ND, (+)BS Psychiatric: appropriate Neurological: Ox3   MAS of 1+ to 2 in RLE- no clonus GU- light yellow amber urine-a little darker this AM Skin: Heels covered with foam dressings B/L- R heel DTI and Sacral Stage II  Wearing prevalons B/L and propped with wedge to L side   Prior exam MSK: Ue's 4/5 B/L throughout LE's- 2-/5 B/L-in HF/KE- 4-/5 DF/PF but RLE weaker than LLE Spasticity- MAS of 1+ to  2 in Ue's this Am and 3 in RLE- MAS worse this AM- 2-3 throughout Neurologic: Cranial nerves II through XII intact, motor strength is 4/5 in bilateral  bicep, tricep, grip,and finger ext and hand intrinsics, 3- RIght delt and 4/5 left delt, 3- bilateral  hip flexor, 3- RIght and 4- left knee extensors, 4/5 bilateral ankle dorsiflexor and plantar flexor Sensory exam normal sensation to light touch and proprioception in bilateral upper and lower extremities Tone- 3+ a bilateral biceps triceps and BR, + Hoffman's bilateral 2/5 RIght 3+ left knee Also MAS 3 tone in RIght Knee flexors and extensors, MAS 1 at the left knee Clonus at bilateral ankles  Musculoskeletal: Full range of motion in all 4 extremities. No joint swelling      Assessment/Plan: 1. Functional deficits which require 3+ hours per day of interdisciplinary therapy in a comprehensive inpatient rehab setting. Physiatrist  is providing close team supervision and 24 hour management of active medical problems listed below. Physiatrist and rehab team continue to assess barriers to discharge/monitor patient progress toward functional and medical goals  Care Tool:  Bathing              Bathing assist Assist Level: Maximal Assistance - Patient 24 - 49%     Upper Body Dressing/Undressing Upper body dressing   What is the patient wearing?: Pull over shirt    Upper body assist Assist Level: Minimal Assistance - Patient > 75%    Lower Body Dressing/Undressing Lower body dressing      What is the patient wearing?: Underwear/pull up, Pants     Lower body assist Assist for lower body dressing: Dependent - Patient 0%     Toileting Toileting    Toileting assist Assist for toileting: Dependent - Patient 0%     Transfers Chair/bed transfer  Transfers assist     Chair/bed transfer assist level: Moderate Assistance - Patient 50 - 74%     Locomotion Ambulation   Ambulation assist   Ambulation activity did not occur: Safety/medical concerns  Assist level: 2 helpers Assistive device: [name] Max distance: 10'   Walk 10 feet activity   Assist  Walk 10 feet activity did not occur: Safety/medical concerns  Assist level: 2 helpers Assistive device: Walker-Eva   Walk 50 feet activity   Assist Walk 50 feet with 2 turns activity did not occur: Safety/medical concerns         Walk 150 feet activity   Assist Walk 150 feet activity did not occur: Safety/medical concerns         Walk 10 feet on uneven surface  activity   Assist Walk 10 feet on uneven surfaces activity did not occur: Safety/medical concerns         Wheelchair     Assist Is the patient using a wheelchair?: Yes Type of Wheelchair: Power    Wheelchair assist level: Supervision/Verbal cueing Max wheelchair distance: 150'    Wheelchair 50 feet with 2 turns activity    Assist         Assist Level: Supervision/Verbal cueing   Wheelchair 150 feet activity     Assist      Assist Level: Supervision/Verbal cueing   Blood pressure (!) 117/56, pulse 97, temperature 98.1 F (36.7 C), temperature source Oral, resp. rate 17, height 5\' 4"  (1.626 m), weight 60.6 kg, SpO2 96%.  Medical Problem List and Plan: 1. 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  Antithrombotics: -DVT/anticoagulation:  Mechanical: Sequential compression devices, below knee Bilateral lower extremities 4/23- will see if Dr Ellery Guthrie will allow us  to start Lovenox - he said yes, so will start. 30mg  daily. 5/14- when leaves, will go on Eliquis for a total of 3 months 2.5 mg BID             -antiplatelet therapy: N/A 3. Pain Management: tylenol  prn. Voltaren  QID, lyrica  50mg  BID, flexeril  PRN 5/8- pain better since started baclofen - didn't need tramadol  5/9- will add Dantrolene  50 mg due to Allergy with Baclofen  at bedtime and see if tolerates- if so, will con't to increase the dose to help spasticity and reduce chance of sedation/side effects  -11/07/23 very delirious yesterday per pt, will stop baclofen  and leave Dantrolene  for now; monitor 5/19- pain doing well as long as turned and gets prevalon boots 5/20- pt reports pain still on backside and R heel 5/21- pain meds working -w hen takes Tylenol   5/22- Tylenol  works for sacral and R heel pain 5/23- Stander was extremely effective for Spasticity pain in legs! 4. Mood/Behavior/Sleep: LCSW to follow up for evaluation and support when appropriate --Delirium  precautions--His mental status has cleared but will monitor for worsening with transfer to new unit .pt has not required prn meds for agitation in several days  Monitor sleep wake cycle             -antipsychotic agents: Change haldol  q 6 hrs prn to Seroquel  12.5mg  tid prn  -Restoril  7.5mg  nightly, cymbalta  20mg  daily 4/23-4/25 no delirium so far 4/29- still confused this AM-  5/9 Aox3 this AM -11/07/23 delirious yesterday, stopped baclofen  as above. 5/14- resolved  5. Neuropsych/cognition: This patient is usually  capable of making decisions on his own behalf. 6. Skin/Wound Care: Routine pressure relief measures.  5/20- Has Stage II on backside- now has minor breakdown per nursing- and R heel DTI- has prevalons- pt to be turned q2 hours- couldn't tolerate air bed.   5/21- turning with new wedge we got him  5/22- pt reports with new wedge, less discomfort on sacrum- however R DTI still uncomfortable constantly- will d/w nursing about turning him maybe MORE to the side- so heels not down all the time?  5/23- better when leaves a "gap" on is helpful for heel pain- d/w nursing 7. Fluids/Electrolytes/Nutrition:  Monitor I/O. Routine labs. Continue vitamins/supplements. 8. 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 Willye Harvey, per note, leave in place for 7-10d, Consider resuming In-N-Out catheterization for management of his neurogenic bladder at that time. Recommend using a coud catheter for intermittent catheterization (when resumed).  5/7- pt and wife have decided they don't want to remove foley - they don't want ot go back to  in/out caths-  since wasn't emptying before- they want a  chronic foley- when offered to remove foley just for rehab, to see if he gets return, he is scared will get bleeding again, due to false passage already had and declined 5/12 Foley draining clear yellow urine 5/16- wife still wants and if going to SNF, will need to keep 5/20- pt wants to keep Foley- doesn't want to go back to in/out caths 10.  Diabetes- A1c 8.0-  with peripheral neuropathy by hx only on Jardiance at home, use SSI in the hospital  -10/23/23 CBGs variable but likely d/t steroids; cont SSI for now 4/29- CBG's very labile in last 24 hours 99-383- - it concerns me to start Jardiance since of Foley and risk of UTI's- also for starting Tradjenta  since BG was 99 last night- don't want to drop BG too much 4/30- Will add Tradjenta  since BG's still running 150-242- will monitor closely for CBG hypoglycemia-   10/30/23 CBGs looking great!  -10/31/23 CBGs a little higher the last day, but monitor for trend  5/5- Cbgs all less than 200- con't to monitor  5/6- CBGs 130s- 180s- 5/7-5/9 CBG's 128 to 220- had snack yesterday- still snacking in afternoon -5/10-11/25 CBGs great except for afternoon d/t snacks. Leave meds alone for now -5/12 fair glucose control, continue current regimen and monitor. 5/13- 5/15- great control except for afternoon- snacking- will d/w pt 5/16- Cbgs jumping higher- if this continues to occur, will need other meds for CBG's -11/13/23 CBGs so variable but mostly it's the afternoon/evening values; will start carb modified diet as he's on regular diet right now. If not, might need to add other med? -11/14/23 CBGs much better overnight; asked that he limit his sugar (has regular cola next to his bed), hopefully diet alone will control his sugars better; pt aware and agreeable.  5/19-5/22 Cbgs looking much better- con't regimen 5/23- BG's doing well overall- con't regimen CBG (last 3)  Recent Labs    11/18/23 1659 11/18/23 2129 11/19/23 0617  GLUCAP 155* 186* 101*     11.   Spasticity affecting LEs> UE and R>L , aggressive ROM  with PT, consider oral meds  4/22- starting baclofen  5 mg TID- with his Cr 1.50- has been running 1.68 to 2.30  4/24- will not increase baclofen  due to Renal issues 5/9- will add Dantrolene  50 mg at bedtime to reduce chance of spasticity but also reduce chance of side effects/sedation- - will monitor -11/07/23 stopped baclofen  d/t delirium, cont dantrolene  5/20- increased Dantrolene  to 50 mg TID- no side effects at this time 12.  PAD s/p SFA and Iliac stenting, continue metoprolol  50mg  daily  13. CKD3b? With AKI- AKI has resolved  4/28- Cr 1.63- doing better 4/29- Cr 1.59 after 12+ hours of IVF's- and BUN very slightly down to 70 from 72-   5/1- Cr down to 1.43 and BUN down to 53  5/5- Cr 1.51 and BUN down to 37- drinking more!  5/8- Cr 1.8- up from 1.51- but BUN 34 down from 37- will push fluids  5/12 creatinine and BUN much lower at 1.33/26-improved  5/15- Cr 1.68 and BUN 30 this AM- BUN improving- but Cr bounces  5/19- BUN 30 and Cr 1.64  5/22- BUN 33 and Cr 1.45 14. Neurogenic bowel: continue miralax  32g daily, senokot s 2 tabs qAM  5/1- LBM this Am- was accident- might need bowel program 5/20- Bowel program yielded results without dig stim last night- feeling good- con't regimen  15. HLD: crestor  20mg  nightly 16. Delirium/Confusion 4/30- pt Ox3 this AM- very with it- likely flexeril  based on chart review- given for pain since on no pain meds- will keep off Flexeril  and monitor Sx's  5/5- Sx's resolved  -11/07/23 SEE ABOVE 17. Hx of HTN with severe hypotension 5/6-5/11 BP doing better- con't regimen 5/12 orthostatic hypotension noted this morning, bolus IVF 500cc. 5/13- Will increase Midodrine  to 7.5 mg TID 5/14- 5/15- BP slightly better actually has 1 episode of 155 systolic so wait to increase Midodrine  5/16-5/20- BP doing better- only one episode where BP 110's- con't to monitor 5/22- BP running low 100's systolic when laying  down- will d/w therapy if having drops in BP still - if so will increase midodrine  5/23- stood in stander with no drops in BP Vitals:   11/15/23 1334 11/15/23 1932 11/16/23 0346 11/16/23 1307  BP: 122/60 112/62 (!) 112/56 (!) 118/59   11/16/23 1928 11/17/23 0611 11/17/23 1252 11/17/23 1940  BP: (!) 114/51 (!) 102/55 (!) 117/59 (!) 103/54   11/18/23 0623 11/18/23 1358 11/18/23 2010 11/19/23 0453  BP: (!) 106/55 (!) 126/55 (!) 107/51 (!) 117/56     18. Atelectasis 4/30- sounds coarse- asked pt to use ICS regularly- is at bedside- also described "spitting things up"_ had "spit cup"- at bedside-  had barely anything in it. 5/5- not spitting up anymore  19. R heel DTI and Stage I on sacrum 5/2- is new- ordered prevalon boots as well as low air loss mattress and needs to be turned q2 hours- placed order  5/8- pt not able to tolerate air mattress- turn q2 hours and prevalons for Heel DTI- d/w nursing- just removed this AM 5/12 patient asked for device to keep pressure off his heel, Prevalon boots at bedside. Will chek with nursing regarding use of these boots at night.  20. Spasticity  5/5- will d/w wife tomorrow about restarting Baclofen  vs Dantrolene .   5/6- attempted to call wife again x2- cannot get her 5/7- was able to see wife yesterday and discussed- we will retry Pt on Baclofen  5 mg TID for now- not a higher dose due to renal issues-  and monitor for confusion/delirium- if gets confused, will try Dantrolene .   5/8- no confusion and RLE much less pain- so think it's working 5/9- will add Dantrolene  as above 5/14- Increased dantrolene  to 50 mg BID 5/20- increase Dantrolene  to 50 mg TID- is helping some and no side effects 5/22- feels like spasticity is improving mildly-  21. Severe malnutrition  5/5- per Dietitian- will do supplements 22. Mild tachycardia 5/8- per pt, has ALWAYS had tachycardia- has been worked up- and nothing found- usually 90s-100's per pt- which is the same here- will  monitor 23. Ileus??? 5/13- reached out to Radiology about pt's questionable ileus- not clear in Radiology report  5/14- doesn't have ileus- but full of gas- started Simethicone  QID  5/20- make Gas-X prn per pt request 24. Nasal congestion with mild cough 5/15- probably due to post nasal drip- will start Flonase  2 sprays each nare daily.   5/16- says nasal congestion much better- won't need Flonase - will make prn 25. Leukocytosis  5/19- no signs of infection, no fever, no confusion- will recheck in AM unless her feels ill today.   5/20- WBC down to 11k- will con't to monitor- and recheck Thursday  5/22- WBC down to 8.8- no fever or other Sx's   I spent a total of 39   minutes on total  care today- >50% coordination of care- due to  D/w nurse before and after saw pt about Spasticity and R heel pain and bowel program   LOS: 32 days A FACE TO FACE EVALUATION WAS PERFORMED  Christian Bailey 11/19/2023, 7:52 AM

## 2023-11-19 NOTE — Plan of Care (Signed)
  Problem: Consults Goal: RH GENERAL PATIENT EDUCATION Description: See Patient Education module for education specifics. Outcome: Progressing   Problem: RH BOWEL ELIMINATION Goal: RH STG MANAGE BOWEL WITH ASSISTANCE Description: STG Manage Bowel with mod I  Assistance. Outcome: Progressing Goal: RH STG MANAGE BOWEL W/MEDICATION W/ASSISTANCE Description: STG Manage Bowel with Medication with mod I Assistance. Outcome: Progressing   Problem: RH BLADDER ELIMINATION Goal: RH STG MANAGE BLADDER WITH ASSISTANCE Description: STG Manage Bladder With toileting / mod I Assistance Outcome: Progressing Goal: RH STG MANAGE BLADDER WITH MEDICATION WITH ASSISTANCE Description: STG Manage Bladder With Medication With mod I Assistance. Outcome: Progressing   Problem: RH SAFETY Goal: RH STG ADHERE TO SAFETY PRECAUTIONS W/ASSISTANCE/DEVICE Description: STG Adhere to Safety Precautions With cues Assistance/Device. Outcome: Progressing   Problem: RH PAIN MANAGEMENT Goal: RH STG PAIN MANAGED AT OR BELOW PT'S PAIN GOAL Description: < 4 with prns Outcome: Progressing   Problem: RH KNOWLEDGE DEFICIT GENERAL Goal: RH STG INCREASE KNOWLEDGE OF SELF CARE AFTER HOSPITALIZATION Description: Patient and wife will be able to manage care at discharge using educational resources for medication, skin care,dietary modification and fall prevention independently Outcome: Progressing   Problem: Education: Goal: Ability to describe self-care measures that may prevent or decrease complications (Diabetes Survival Skills Education) will improve Outcome: Progressing Goal: Individualized Educational Video(s) Outcome: Progressing   Problem: Coping: Goal: Ability to adjust to condition or change in health will improve Outcome: Progressing   Problem: Fluid Volume: Goal: Ability to maintain a balanced intake and output will improve Outcome: Progressing   Problem: Health Behavior/Discharge Planning: Goal: Ability  to identify and utilize available resources and services will improve Outcome: Progressing Goal: Ability to manage health-related needs will improve Outcome: Progressing   Problem: Metabolic: Goal: Ability to maintain appropriate glucose levels will improve Outcome: Progressing   Problem: Nutritional: Goal: Maintenance of adequate nutrition will improve Outcome: Progressing Goal: Progress toward achieving an optimal weight will improve Outcome: Progressing   Problem: Skin Integrity: Goal: Risk for impaired skin integrity will decrease Outcome: Progressing   Problem: Tissue Perfusion: Goal: Adequacy of tissue perfusion will improve Outcome: Progressing

## 2023-11-19 NOTE — Progress Notes (Signed)
 Physical Therapy Session Note  Patient Details  Name: Christian Bailey MRN: [id number] Date of Birth: Jan 29, 1941  Today's Date: 11/19/2023 PT Individual Time: 0936-1000 PT Individual Time Calculation (min): 24 min   Short Term Goals: Week 3:  PT Short Term Goal 1 (Week 3): Pt will perform bed mobility with mod A PT Short Term Goal 1 - Progress (Week 3): Partly met PT Short Term Goal 2 (Week 3): Pt will progress transfers to max A PT Short Term Goal 2 - Progress (Week 3): Met PT Short Term Goal 3 (Week 3): Pt will be able to tolerate x 5 min in standing for WB/postural control retraining/tone management PT Short Term Goal 3 - Progress (Week 3): Met Week 4:  PT Short Term Goal 1 (Week 4): Pt will be able to progress lateral leans for slideboard placement with max A PT Short Term Goal 2 (Week 4): Pt will be able to perform rolling with bed rail features with with mod assist PT Short Term Goal 3 (Week 4): Pt will be able to tolerate WB in standing x 10 min for tone management and NMR   Skilled Therapeutic Interventions/Progress Updates:    Pt presents asleep in PWC, slow to arouse. Prior PT reported that pt had some nausea at end of her session, pt denies this now. Checked BP, see below. Added abdominal binder with min/mod A to facilitate anterior weighshift to don. Focused on seated core strengthening and balance retraining with anterior weightshift and then progressing to cross body reaching x 5 reps each side alternating heights and directions each time x 3 trials. Then progressed to forward and backward anterior weightshifts with CGA progressing to supervision to initiate and for balance. Cues for exhale on exertion and continued use of breathing to facilitate. BP monitored and improved with abdominal binder and overall movement and mobility. Left on and up in w/c at end of session with all needs in reach.   Therapy Documentation Precautions:  Precautions Precautions: Fall,  Cervical Precaution/Restrictions Comments: decreased recall of precautions, not able to verbalize any when prompted Required Braces or Orthoses: Cervical Brace Cervical Brace: Soft collar Restrictions Weight Bearing Restrictions Per Provider Order: No General:   Vital Signs: 101/46 mmHg; seated without abdominal binder HR = 82 bpm  100/55 mmHg; with abdominal binder HR = 86 bpm  112/58 mmHg with abdominal binder and after exercises in session  Pain: Legs feel "pretty good today", premedicated.    Therapy/Group: Individual Therapy  Gita Lamb [name], PT, DPT, CBIS  11/19/2023, 12:18 PM

## 2023-11-19 NOTE — Progress Notes (Signed)
 Physical Therapy Session Note  Patient Details  Name: Christian Bailey MRN: [id number] Date of Birth: 18-May-1941  Today's Date: 11/19/2023 PT Individual Time: 0800-0900 PT Individual Time Calculation (min): 60 min   Short Term Goals: Week 4:  PT Short Term Goal 1 (Week 4): Pt will be able to progress lateral leans for slideboard placement with max A PT Short Term Goal 2 (Week 4): Pt will be able to perform rolling with bed rail features with with mod assist PT Short Term Goal 3 (Week 4): Pt will be able to tolerate WB in standing x 10 min for tone management and NMR  Skilled Therapeutic Interventions/Progress Updates:    pt received in bed and agreeable to therapy. Pt reports 2/10 pain "generally." premedicated. Rest and positioning provided as needed. Pt assisted with donning ted hose and pants.   Performed slideboard transfer with min a, with pt directed to "teach" therapist how to perform. Pt needed minimal questioning cues for set up, but was able to largely direct transfer with good technique.   Pt then navigated PWC with supervision. Cueing for adjusting speed and positioning as needed.   Session focused on using standing frame for NMR to improve spasticity and lower body strength. Pt tolerated x 5 min and then x 3 min with addition of 2 x 5 Sit to stand from sling with emphasis on eccentric control. Pt was limited by pain in his back and shoulders and reported mild nausea in standing that improved quickly on return to sit. Pt returned to room and remained in chair with needs in reach.   Therapy Documentation Precautions:  Precautions Precautions: Fall, Cervical Precaution/Restrictions Comments: decreased recall of precautions, not able to verbalize any when prompted Required Braces or Orthoses: Cervical Brace Cervical Brace: Soft collar Restrictions Weight Bearing Restrictions Per Provider Order: No General:   Vital Signs: Therapy Vitals Temp: 98.1 F (36.7 C) Temp Source:  Oral Pulse Rate: 97 Resp: 17 BP: (!) 117/56 Patient Position (if appropriate): Lying Oxygen Therapy SpO2: 96 % O2 Device: Room Air Pain: Pain Assessment Pain Scale: 0-10 Pain Score: 4  Pain Location: Leg Pain Intervention(s): Medication (See eMAR) Mobility:   Locomotion :    Trunk/Postural Assessment :    Balance:   Exercises:   Other Treatments:      Therapy/Group: Individual Therapy  [name] 11/19/2023, 8:49 AM

## 2023-11-19 NOTE — Progress Notes (Signed)
 Patient ID: Christian Bailey, male   DOB: [birth year], 83 y.o.   MRN: [id number] Have expanded the NH bed search wife and pt aware of this. Continue to try to reach [place]

## 2023-11-19 NOTE — Progress Notes (Signed)
 Physical Therapy Weekly Progress Note  Patient Details  Name: Christian Bailey MRN: [id number] Date of Birth: 20-Apr-1941  Beginning of progress report period: Nov 13, 2023 End of progress report period: Nov 19, 2023  Today's Date: 11/19/2023 PT Individual Time: 1302-1410 PT Individual Time Calculation (min): 68 min   Patient has met 3 of 3 short term goals.  Pt is making slow but steady progress overall with functional mobility. Pt is demonstrating improvement in overall tone in BLE (R continues to be > L) and trunk which has made functional balance and transfers improved. Pt currently can perform slideboard transfers at best with mod A but often requires Max or even +2 for the transition on the cushion or from the bed for slideboard placement due to decreased ability to perform and maintain lateral leans. Pt requires +2 for standing and pre-gait/gait activities and often using lift equipment to achieve this. Plan remains for d/c to SNF due to level of care required (+2). Pt continues to use PWC for mobility at supervision level.  Patient continues to demonstrate the following deficits muscle weakness and muscle joint tightness, decreased cardiorespiratoy endurance, abnormal tone, unbalanced muscle activation, and decreased coordination, decreased visual acuity, and decreased sitting balance, decreased standing balance, decreased postural control, and decreased balance strategies and therefore will continue to benefit from skilled PT intervention to increase functional independence with mobility.  Patient progressing toward long term goals..  Plan of care revisions: Downgraded goals to reflect functional progress at this time. Standing focus on NMR and WB and downgraded bed mobility/transfers to mod A as well as w/c mobility with supervision due to visual impairments for safety.  PT Short Term Goals Week 4:  PT Short Term Goal 1 (Week 4): Pt will be able to progress lateral leans for slideboard  placement with max A PT Short Term Goal 1 - Progress (Week 4): Met PT Short Term Goal 2 (Week 4): Pt will be able to perform rolling with bed rail features with with mod assist PT Short Term Goal 2 - Progress (Week 4): Met PT Short Term Goal 3 (Week 4): Pt will be able to tolerate WB in standing x 10 min for tone management and NMR PT Short Term Goal 3 - Progress (Week 4): Met Week 5:  PT Short Term Goal 1 (Week 5): Pt will be able to perform lateral leans for slideboard placement with mod assist PT Short Term Goal 2 (Week 5): Pt will be able to perform sit <> stands with max A PT Short Term Goal 3 (Week 5): Pt will be able to perform standing without lift equipment x 2 min for NMR for WB, tone management, and functional strengthening   Skilled Therapeutic Interventions/Progress Updates:  Ambulation/gait training;Cognitive remediation/compensation;Discharge planning;DME/adaptive equipment instruction;Functional mobility training;Pain management;Psychosocial support;Splinting/orthotics;Therapeutic Activities;UE/LE Strength taining/ROM;Visual/perceptual remediation/compensation;Wheelchair propulsion/positioning;UE/LE Coordination activities;Therapeutic Exercise;Stair training;Patient/family education;Skin care/wound management;Balance/vestibular training;Community reintegration;Disease management/prevention;Neuromuscular re-education;Functional electrical stimulation   Session focused on NMR to address balance, postural control retraining, and functional strengthening during transfer training, transitional movements, and sit <> stands.   Pt performed PWC mobility with supervision on unit and when setting up himself to prepare for transfer, ran his R leg into the mat table and sustained a skin tear. RN made aware and bandage applied. Pt denies any further pain or issue after initial injury.  Slideboard transfers from w/c <> mat table with overall mod to max for lateral leans and min to light mod for  actual scooting, demonstrating some activation through BLE. Cues for  technique, hand placement, and anterior lean. Focused on anterior weightshift, pushing through BLE and lifting bottom to perform lateral scooting on the mat to the R and L x 8 reps each direction for repetition and motor learning. Focused on pt lifting BLE to advance as well and was able to initiate this when going to the L, required A to the R. Seated stretching to BLE as noted some adductor tone and then performed active LAQ with unweighted LE's x 10 reps each side. Progressed to sit > attempting partial squat to prepare for sit > stands with max A. +2 for sit <.> stand blocked practice repetitions x 4 trials with multimodal cues once upright for upright posture, knee/hip/trunk extension, as noted to stay in flexed posture and flexed knees. One trial with mod +2. Unable to advance to gait due to knee flexion. Rest breaks provided due to fatigue but worked through session well.  Returned to room and transferred back to bed with mod assist overall for slideboard transfer and mod for returning to supine for BLE management. Trendelenberg used for gravity assist with pt pushing through BLE in bridge position and PT assisting for scooting up in the bed. Prevalon boots donned (pt request and advocated himself) and all needs in reach.   Therapy Documentation Precautions:  Precautions Precautions: Fall, Cervical Precaution/Restrictions Comments: decreased recall of precautions, not able to verbalize any when prompted Required Braces or Orthoses: Cervical Brace Cervical Brace: Soft collar Restrictions Weight Bearing Restrictions Per Provider Order: No    Pain: Denies pain.   Therapy/Group: Individual Therapy  Gita Lamb [name], PT, DPT, CBIS  11/19/2023, 2:25 PM

## 2023-11-19 NOTE — Plan of Care (Signed)
  Problem: RH Balance Goal: LTG Patient will maintain dynamic standing balance (PT) Description: LTG:  Patient will maintain dynamic standing balance with assistance during mobility activities (PT) Outcome: Not Applicable Flowsheets (Taken 11/19/2023 0812) LTG: Pt will maintain dynamic standing balance during mobility activities with:: (d/c due to lack of functional progress) -- Note: D/c due to lack of functional progress   Problem: RH Bed Mobility Goal: LTG Patient will perform bed mobility with assist (PT) Description: LTG: Patient will perform bed mobility with assistance, with/without cues (PT). Flowsheets (Taken 11/19/2023 [phone]) LTG: Pt will perform bed mobility with assistance level of: (downgraded due to progress) Moderate Assistance - Patient 50 - 74% Note: Downgraded due to progress   Problem: RH Bed to Chair Transfers Goal: LTG Patient will perform bed/chair transfers w/assist (PT) Description: LTG: Patient will perform bed to chair transfers with assistance (PT). Flowsheets (Taken 11/19/2023 [phone]) LTG: Pt will perform Bed to Chair Transfers with assistance level: (downgraded due to progress) Moderate Assistance - Patient 50 - 74% Note: downgraded due to progress   Problem: RH Wheelchair Mobility Goal: LTG Patient will propel w/c in controlled environment (PT) Description: LTG: Patient will propel wheelchair in controlled environment, # of feet with assist (PT) Flowsheets Taken 11/19/2023 [id number] by Gita Lamb B, PT LTG: Pt will propel w/c in controlled environ  assist needed:: (downgraded due to visual impairments) Supervision/Verbal cueing Taken 10/19/2023 1458 by Aundria Leech C, PT LTG: Propel w/c distance in controlled environment: 150 ft Note: downgraded due to visual impairments  Goal: LTG Patient will propel w/c in home environment (PT) Description: LTG: Patient will propel wheelchair in home environment, # of feet with assistance (PT). Flowsheets Taken 11/19/2023  [id number] by Gita Lamb B, PT LTG: Pt will propel w/c in home environ  assist needed:: (downgraded due to visual impairments) Supervision/Verbal cueing Taken 10/19/2023 1458 by Brittain, Olivia C, PT LTG: Propel w/c distance in home environment: 50 ft Note: downgraded due to visual impairments

## 2023-11-20 LAB — GLUCOSE, CAPILLARY
Glucose-Capillary: 123 mg/dL — ABNORMAL HIGH (ref 70–99)
Glucose-Capillary: 155 mg/dL — ABNORMAL HIGH (ref 70–99)
Glucose-Capillary: 148 mg/dL — ABNORMAL HIGH (ref 70–99)
Glucose-Capillary: 193 mg/dL — ABNORMAL HIGH (ref 70–99)

## 2023-11-20 NOTE — Progress Notes (Signed)
 PROGRESS NOTE   Subjective/Complaints:  Pt doing well, slept well, pain well managed, LBM this morning at 3am, foley in place and without concerns. No other complaints or concerns.   ROS:    Pt denies SOB, abd pain, CP, N/V/C/D, and vision changes    + Heel discomfort- on R side- still evident and buttock/coccyx pain- slightly better + nausea/vomiting- -hasn't recurred   Objective:   No results found.    Recent Labs    11/18/23 0643  WBC 8.8  HGB 8.5*  HCT 26.4*  PLT 409*       Recent Labs    11/18/23 0643  NA 140  K 3.7  CL 103  CO2 26  GLUCOSE 103*  BUN 33*  CREATININE 1.45*  CALCIUM  8.6*        Intake/Output Summary (Last 24 hours) at 11/20/2023 1234 Last data filed at 11/20/2023 0732 Gross per 24 hour  Intake 420 ml  Output 650 ml  Net -230 ml     Pressure Injury 10/28/23 Sacrum Medial Stage 2 -  Partial thickness loss of dermis presenting as a shallow open injury with a red, pink wound bed without slough. STAGE 1 ON SACRUM (Active)  10/28/23 1333  Location: Sacrum  Location Orientation: Medial  Staging: Stage 2 -  Partial thickness loss of dermis presenting as a shallow open injury with a red, pink wound bed without slough.  Wound Description (Comments): STAGE 1 ON SACRUM  Present on Admission: No     Pressure Injury 10/28/23 Heel Right Deep Tissue Pressure Injury - Purple or maroon localized area of discolored intact skin or blood-filled blister due to damage of underlying soft tissue from pressure and/or shear. DTI on bottom of heel (Active)  10/28/23 1820  Location: Heel  Location Orientation: Right  Staging: Deep Tissue Pressure Injury - Purple or maroon localized area of discolored intact skin or blood-filled blister due to damage of underlying soft tissue from pressure and/or shear.  Wound Description (Comments): DTI on bottom of heel  Present on Admission: No    Physical  Exam: Vital Signs Blood pressure 120/65, pulse (!) 107, temperature (!) 97.3 F (36.3 C), temperature source Oral, resp. rate 18, height 5\' 4"  (1.626 m), weight 60.6 kg, SpO2 97%.    General: awake, alert, appropriate, resting sup slightly in bed; propped with wedge to left, NAD HENT: conjugate gaze; oropharynx moist CV: regular rate and rhythm- rate in 90's on exam; no JVD, no m/r/g appreciated Pulmonary: CTA B/L; no W/R/R- good air movement GI: soft, NT, ND, (+)BS Psychiatric: appropriate Neurological: alert and oriented at baseline GU- light yellow amber urine in foley bag Wearing prevalons B/L and propped to left side with wedge  PRIOR EXAMS: MAS of 1+ to 2 in RLE- no clonus  Skin: Heels covered with foam dressings B/L- R heel DTI and Sacral Stage II     Prior exam MSK: Ue's 4/5 B/L throughout LE's- 2-/5 B/L-in HF/KE- 4-/5 DF/PF but RLE weaker than LLE Spasticity- MAS of 1+ to 2 in Ue's this Am and 3 in RLE- MAS worse this AM- 2-3 throughout Neurologic: Cranial nerves II through XII intact, motor strength is 4/5  in bilateral  bicep, tricep, grip,and finger ext and hand intrinsics, 3- RIght delt and 4/5 left delt, 3- bilateral  hip flexor, 3- RIght and 4- left knee extensors, 4/5 bilateral ankle dorsiflexor and plantar flexor Sensory exam normal sensation to light touch and proprioception in bilateral upper and lower extremities Tone- 3+ a bilateral biceps triceps and BR, + Hoffman's bilateral 2/5 RIght 3+ left knee Also MAS 3 tone in RIght Knee flexors and extensors, MAS 1 at the left knee Clonus at bilateral ankles  Musculoskeletal: Full range of motion in all 4 extremities. No joint swelling      Assessment/Plan: 1. Functional deficits which require 3+ hours per day of interdisciplinary therapy in a comprehensive inpatient rehab setting. Physiatrist is providing close team supervision and 24 hour management of active medical problems listed below. Physiatrist and  rehab team continue to assess barriers to discharge/monitor patient progress toward functional and medical goals  Care Tool:  Bathing              Bathing assist Assist Level: Maximal Assistance - Patient 24 - 49%     Upper Body Dressing/Undressing Upper body dressing   What is the patient wearing?: Pull over shirt    Upper body assist Assist Level: Minimal Assistance - Patient > 75%    Lower Body Dressing/Undressing Lower body dressing      What is the patient wearing?: Underwear/pull up, Pants     Lower body assist Assist for lower body dressing: Dependent - Patient 0%     Toileting Toileting    Toileting assist Assist for toileting: Dependent - Patient 0%     Transfers Chair/bed transfer  Transfers assist     Chair/bed transfer assist level: Moderate Assistance - Patient 50 - 74%     Locomotion Ambulation   Ambulation assist   Ambulation activity did not occur: Safety/medical concerns  Assist level: 2 helpers Assistive device: [name] Max distance: 10'   Walk 10 feet activity   Assist  Walk 10 feet activity did not occur: Safety/medical concerns  Assist level: 2 helpers Assistive device: Walker-Eva   Walk 50 feet activity   Assist Walk 50 feet with 2 turns activity did not occur: Safety/medical concerns         Walk 150 feet activity   Assist Walk 150 feet activity did not occur: Safety/medical concerns         Walk 10 feet on uneven surface  activity   Assist Walk 10 feet on uneven surfaces activity did not occur: Safety/medical concerns         Wheelchair     Assist Is the patient using a wheelchair?: Yes Type of Wheelchair: Power    Wheelchair assist level: Supervision/Verbal cueing Max wheelchair distance: 150'    Wheelchair 50 feet with 2 turns activity    Assist        Assist Level: Supervision/Verbal cueing   Wheelchair 150 feet activity     Assist      Assist Level:  Supervision/Verbal cueing   Blood pressure 120/65, pulse (!) 107, temperature (!) 97.3 F (36.3 C), temperature source Oral, resp. rate 18, height 5\' 4"  (1.626 m), weight 60.6 kg, SpO2 97%.  Medical Problem List and Plan: 1. 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  Antithrombotics: -DVT/anticoagulation:  Mechanical: Sequential compression devices, below knee Bilateral lower extremities 4/23- will see if Dr Ellery Guthrie will allow us  to start Lovenox - he said yes, so will start. 30mg  daily. 5/14- when leaves, will go on Eliquis for a total of 3 months 2.5 mg BID             -antiplatelet therapy: N/A 3. Pain Management: tylenol  prn. Voltaren  QID, lyrica  50mg  BID, flexeril  PRN 5/8- pain better since started baclofen - didn't need tramadol  5/9- will add Dantrolene  50 mg due to Allergy with Baclofen  at bedtime and see if tolerates- if so, will con't to increase the dose to help spasticity and reduce chance of sedation/side effects  -11/07/23 very delirious yesterday per pt, will stop baclofen  and leave Dantrolene  for now; monitor 5/19- pain doing well as long as turned and gets prevalon boots 5/20- pt reports pain still on backside and R heel 5/21- pain meds working -w hen takes Tylenol   5/22- Tylenol  works for sacral and R heel pain 5/23- Stander was extremely effective for Spasticity pain in legs! 4. Mood/Behavior/Sleep: LCSW to follow up for evaluation and support when appropriate --Delirium precautions--His mental status has cleared but will monitor for worsening with transfer to new unit .pt has not  required prn meds for agitation in several days  Monitor sleep wake cycle             -antipsychotic agents: Change haldol  q 6 hrs prn to Seroquel  12.5mg  tid prn  -Restoril  7.5mg  nightly, cymbalta  20mg  daily 4/23-4/25 no delirium so far 4/29- still confused this AM-  5/9 Aox3 this AM -11/07/23 delirious yesterday, stopped baclofen  as above. 5/14- resolved  5. Neuropsych/cognition: This patient is usually  capable of making decisions on his own behalf. 6. Skin/Wound Care: Routine pressure relief measures.  5/20- Has Stage II on backside- now has minor breakdown per nursing- and R heel DTI- has prevalons- pt to be turned q2 hours- couldn't tolerate air bed.   5/21- turning with new wedge we got him  5/22- pt reports with new wedge, less discomfort on sacrum- however R DTI still uncomfortable constantly- will d/w nursing about turning him maybe MORE to the side- so heels not down all the time?  5/23- better when leaves a "gap" on is helpful for heel pain- d/w nursing 7. Fluids/Electrolytes/Nutrition:  Monitor I/O. Routine labs. Continue vitamins/supplements. 8. 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 Willye Harvey, per note, leave in place for 7-10d, Consider resuming In-N-Out catheterization for management of his neurogenic bladder at that time. Recommend using a coud catheter for intermittent catheterization (when resumed).  5/7- pt and wife have decided they don't want to remove foley - they don't want ot go back to in/out caths-  since wasn't emptying before- they want a chronic foley- when offered to remove foley just for rehab, to see if he gets return, he is scared will get  bleeding  again, due to false passage already had and declined 5/12 Foley draining clear yellow urine 5/16- wife still wants and if going to SNF, will need to keep 5/20- pt wants to keep Foley- doesn't want to go back to in/out caths 10.  Diabetes- A1c 8.0-  with peripheral neuropathy by hx only on Jardiance at home, use SSI in the hospital  -10/23/23 CBGs variable but likely d/t steroids; cont SSI for now 4/29- CBG's very labile in last 24 hours 99-383- - it concerns me to start Jardiance since of Foley and risk of UTI's- also for starting Tradjenta  since BG was 99 last night- don't want to drop BG too much 4/30- Will add Tradjenta  since BG's still running 150-242- will monitor closely for CBG hypoglycemia-   10/30/23 CBGs looking great!  -10/31/23 CBGs a little higher the last day, but monitor for trend  5/5- Cbgs all less than 200- con't to monitor  5/6- CBGs 130s- 180s- 5/7-5/9 CBG's 128 to 220- had snack yesterday- still snacking in afternoon -5/10-11/25 CBGs great except for afternoon d/t snacks. Leave meds alone for now -5/12 fair glucose control, continue current regimen and monitor. 5/13- 5/15- great control except for afternoon- snacking- will d/w pt 5/16- Cbgs jumping higher- if this continues to occur, will need other meds for CBG's -11/13/23 CBGs so variable but mostly it's the afternoon/evening values; will start carb modified diet as he's on regular diet right now. If not, might need to add other med? -11/14/23 CBGs much better overnight; asked that he limit his sugar (has regular cola next to his bed), hopefully diet alone will control his sugars better; pt aware and agreeable.  5/19-5/22 Cbgs looking much better- con't regimen 5/23-24 BG's doing well overall- con't regimen CBG (last 3)  Recent Labs    11/19/23 2110 11/20/23 0550 11/20/23 1123  GLUCAP 132* 123* 155*     11.  Spasticity affecting LEs> UE and R>L , aggressive ROM  with PT, consider oral meds  4/22- starting  baclofen  5 mg TID- with his Cr 1.50- has been running 1.68 to 2.30  4/24- will not increase baclofen  due to Renal issues 5/9- will add Dantrolene  50 mg at bedtime to reduce chance of spasticity but also reduce chance of side effects/sedation- - will monitor -11/07/23 stopped baclofen  d/t delirium, cont dantrolene  5/20- increased Dantrolene  to 50 mg TID- no side effects at this time 12.  PAD s/p SFA and Iliac stenting, continue metoprolol  50mg  daily  13. CKD3b? With AKI- AKI has resolved  4/28- Cr 1.63- doing better 4/29- Cr 1.59 after 12+ hours of IVF's- and BUN very slightly down to 70 from 72-   5/1- Cr down to 1.43 and BUN down to 53  5/5- Cr 1.51 and BUN down to 37- drinking more!  5/8- Cr 1.8- up from 1.51- but BUN 34 down from 37- will push fluids  5/12 creatinine and BUN much lower at 1.33/26-improved  5/15- Cr 1.68 and BUN 30 this AM- BUN improving- but Cr bounces  5/19- BUN 30 and Cr 1.64  5/22- BUN 33 and Cr 1.45 14. Neurogenic bowel: continue miralax  32g daily, senokot s 2 tabs qAM  5/1- LBM this Am- was accident- might need bowel program 5/20- Bowel program yielded results without dig stim last night- feeling good- con't regimen 15. HLD: crestor  20mg  nightly 16. Delirium/Confusion 4/30- pt Ox3 this AM- very with it- likely flexeril  based on chart review- given for pain since on no pain meds- will keep off Flexeril   and monitor Sx's  5/5- Sx's resolved  -11/07/23 SEE ABOVE 17. Hx of HTN with severe hypotension 5/6-5/11 BP doing better- con't regimen 5/12 orthostatic hypotension noted this morning, bolus IVF 500cc. 5/13- Will increase Midodrine  to 7.5 mg TID 5/14- 5/15- BP slightly better actually has 1 episode of 155 systolic so wait to increase Midodrine  5/16-5/20- BP doing better- only one episode where BP 110's- con't to monitor 5/22- BP running low 100's systolic when laying down- will d/w therapy if having drops in BP still - if so will increase midodrine  5/23- stood in  stander with no drops in BP -11/20/23 BPs better, monitor Vitals:   11/16/23 1307 11/16/23 1928 11/17/23 0611 11/17/23 1252  BP: (!) 118/59 (!) 114/51 (!) 102/55 (!) 117/59   11/17/23 1940 11/18/23 0623 11/18/23 1358 11/18/23 2010  BP: (!) 103/54 (!) 106/55 (!) 126/55 (!) 107/51   11/19/23 0453 11/19/23 1455 11/19/23 1935 11/20/23 0319  BP: (!) 117/56 (!) 142/59 (!) 107/56 120/65     18. Atelectasis 4/30- sounds coarse- asked pt to use ICS regularly- is at bedside- also described "spitting things up"_ had "spit cup"- at bedside-  had barely anything in it. 5/5- not spitting up anymore  19. R heel DTI and Stage I on sacrum 5/2- is new- ordered prevalon boots as well as low air loss mattress and needs to be turned q2 hours- placed order  5/8- pt not able to tolerate air mattress- turn q2 hours and prevalons for Heel DTI- d/w nursing- just removed this AM 5/12 patient asked for device to keep pressure off his heel, Prevalon boots at bedside. Will chek with nursing regarding use of these boots at night.  20. Spasticity  5/5- will d/w wife tomorrow about restarting Baclofen  vs Dantrolene .   5/6- attempted to call wife again x2- cannot get her 5/7- was able to see wife yesterday and discussed- we will retry Pt on Baclofen  5 mg TID for now- not a higher dose due to renal issues-  and monitor for confusion/delirium- if gets confused, will try Dantrolene .   5/8- no confusion and RLE much less pain- so think it's working 5/9- will add Dantrolene  as above 5/14- Increased dantrolene  to 50 mg BID 5/20- increase Dantrolene  to 50 mg TID- is helping some and no side effects 5/22- feels like spasticity is improving mildly-  21. Severe malnutrition  5/5- per Dietitian- will do supplements 22. Mild tachycardia 5/8- per pt, has ALWAYS had tachycardia- has been worked up- and nothing found- usually 90s-100's per pt- which is the same here- will monitor 23. Ileus??? 5/13- reached out to Radiology about pt's  questionable ileus- not clear in Radiology report  5/14- doesn't have ileus- but full of gas- started Simethicone  QID  5/20- make Gas-X prn per pt request 24. Nasal congestion with mild cough 5/15- probably due to post nasal drip- will start Flonase  2 sprays each nare daily.   5/16- says nasal congestion much better- won't need Flonase - will make prn 25. Leukocytosis  5/19- no signs of infection, no fever, no confusion- will recheck in AM unless her feels ill today.   5/20- WBC down to 11k- will con't to monitor- and recheck Thursday  5/22- WBC down to 8.8- no fever or other Sx's    LOS: 33 days A FACE TO [facility] EVALUATION WAS PERFORMED  [address] 11/20/2023, 12:34 PM

## 2023-11-20 NOTE — Progress Notes (Incomplete)
 IP Rehab Bowel Program Documentation   Bowel Program Start time 1800   Dig Stim Indicated? No  Dig Stim Prior to Suppository or mini Enema X {Numbers; 1-5:17750}   Output from dig stim: {Desc; minimal/small/moderate/large/very large:110034}  Ordered intervention: Suppository Yes , mini enema No ,   Repeat dig stim after Suppository or Mini enema  X {Numbers; 1-5:17750},  Output? {Desc; minimal/small/moderate/large/very large:110034}   Bowel Program Complete? {YES/NO:21197}, handoff given yes   Patient Tolerated? {YES/NO:21197}

## 2023-11-21 LAB — GLUCOSE, CAPILLARY
Glucose-Capillary: 120 mg/dL — ABNORMAL HIGH (ref 70–99)
Glucose-Capillary: 185 mg/dL — ABNORMAL HIGH (ref 70–99)
Glucose-Capillary: 104 mg/dL — ABNORMAL HIGH (ref 70–99)
Glucose-Capillary: 162 mg/dL — ABNORMAL HIGH (ref 70–99)

## 2023-11-21 NOTE — Progress Notes (Signed)
 IP Rehab Bowel Program Documentation   Bowel Program Start time 1800  Dig Stim Indicated? No  Dig Stim Prior to Suppository or mini Enema X 0   Output from dig stim: N/A  Ordered intervention: Suppository Yes , mini enema No ,   Repeat dig stim after Suppository or Mini enema  X N/A,  Output? Large soft  Bowel Program Complete? Yes , handoff given completed at [facility]  Patient Tolerated? Yes

## 2023-11-21 NOTE — Progress Notes (Signed)
 IP Rehab Bowel Program Documentation   Bowel Program Start time 1800   Dig Stim Indicated? No     Output from dig stim: N/A   Output? Moderate   Bowel Program Complete? Yes ,  Patient Tolerated? Yes

## 2023-11-21 NOTE — Progress Notes (Signed)
 Occupational Therapy Session Note  Patient Details  Name: Christian Bailey MRN: [id number] Date of Birth: [birth year]  Today's Date: 11/21/2023 OT Individual Time: 1445-1535 OT Individual Time Calculation (min): 50 min    Short Term Goals: Week 5:  OT Short Term Goal 1 (Week 5): Pt will complete bathing at Mod A with AE as necessary OT Short Term Goal 2 (Week 5): Pt will complete bed mobility at Mod A in order to prepare for ADLs OT Short Term Goal 3 (Week 5): Pt will complete sit to stand with Mod A consistently throughout sessions within stedy  Skilled Therapeutic Interventions/Progress Updates:      Therapy Documentation Precautions:  Precautions Precautions: Fall, Cervical Precaution/Restrictions Comments: decreased recall of precautions, not able to verbalize any when prompted Required Braces or Orthoses: Cervical Brace Cervical Brace: Soft collar Restrictions Weight Bearing Restrictions Per Provider Order: No General: "That was a good session today!" Pt supine in bed upon OT arrival, agreeable to OT session. OT providing skilled intervention for ADL retraining, LE NMRE and standing balance during session.  Pain: no pain reported  ADL: OT providing skilled intervention on ADL retraining in order to increase independence with tasks and increase activity tolerance. Pt completed the following tasks at the current level of assist: Bed mobility: Mod A, able to maintain side lying with UE holding onto bed rail to roll Lt and Rt, able to use UE slightly to push into sitting EOB with Max A Toileting: pt found on bed pan at start of session with no void, pt unsure of how long bed pan was there, OT assisted pt off of bed pan and completed pants management total A LB dressing: total A overall at bed level Transfers: Max A for stand pivot transfer in order for increased weight bearing on BLE  Exercises: Pt completed the following exercise in order to improve functional activity, strength and  endurance to prepare for ADLs such as functional transfers. Pt completed the following exercises in seated/standing position with no noted LOB/SOB and 3x5 repetitions on each exercise: -modified squats, Max A fading to heavy Mod to stand with // bars, min A to stay standing within // bars, VC for posture and BLE knee block, pt very pleased with activity and standing task    Pt seated in W/C at end of session with W/C alarm donned, call light within reach and 4Ps assessed.     Therapy/Group: Individual Therapy  Nila Barth, OTD, OTR/L 11/21/2023, 4:11 PM

## 2023-11-21 NOTE — Progress Notes (Signed)
 PROGRESS NOTE   Subjective/Complaints:  Pt doing well again, slept well, pain well managed, LBM last night, foley in place and without concerns. No other complaints or concerns.   ROS:    Pt denies SOB, abd pain, CP, N/V/C/D, and vision changes    + Heel discomfort- on R side- still evident and buttock/coccyx pain- slightly better + nausea/vomiting- -hasn't recurred   Objective:   No results found.    No results for input(s): "WBC", "HGB", "HCT", "PLT" in the last 72 hours.      No results for input(s): "NA", "K", "CL", "CO2", "GLUCOSE", "BUN", "CREATININE", "CALCIUM " in the last 72 hours.       Intake/Output Summary (Last 24 hours) at 11/21/2023 1001 Last data filed at 11/21/2023 0900 Gross per 24 hour  Intake 838 ml  Output 600 ml  Net 238 ml     Pressure Injury 10/28/23 Sacrum Medial Stage 2 -  Partial thickness loss of dermis presenting as a shallow open injury with a red, pink wound bed without slough. STAGE 1 ON SACRUM (Active)  10/28/23 1333  Location: Sacrum  Location Orientation: Medial  Staging: Stage 2 -  Partial thickness loss of dermis presenting as a shallow open injury with a red, pink wound bed without slough.  Wound Description (Comments): STAGE 1 ON SACRUM  Present on Admission: No     Pressure Injury 10/28/23 Heel Right Deep Tissue Pressure Injury - Purple or maroon localized area of discolored intact skin or blood-filled blister due to damage of underlying soft tissue from pressure and/or shear. DTI on bottom of heel (Active)  10/28/23 1820  Location: Heel  Location Orientation: Right  Staging: Deep Tissue Pressure Injury - Purple or maroon localized area of discolored intact skin or blood-filled blister due to damage of underlying soft tissue from pressure and/or shear.  Wound Description (Comments): DTI on bottom of heel  Present on Admission: No    Physical Exam: Vital  Signs Blood pressure (!) 128/55, pulse (!) 55, temperature 97.8 F (36.6 C), temperature source Oral, resp. rate 18, height 5\' 4"  (1.626 m), weight 60.6 kg, SpO2 97%.    General: awake, alert, appropriate, resting supine in bed, just slightly to the left; NAD HENT: conjugate gaze; oropharynx moist CV: regular rate and rhythm; no JVD, no m/r/g appreciated Pulmonary: CTA B/L; no W/R/R- good air movement GI: soft, NT, ND, (+)BS Psychiatric: appropriate Neurological: alert and oriented at baseline GU- light yellow amber urine in foley bag Wearing prevalons B/L and propped slightly to left side with wedge  PRIOR EXAMS: MAS of 1+ to 2 in RLE- no clonus  Skin: Heels covered with foam dressings B/L- R heel DTI and Sacral Stage II     Prior exam MSK: Ue's 4/5 B/L throughout LE's- 2-/5 B/L-in HF/KE- 4-/5 DF/PF but RLE weaker than LLE Spasticity- MAS of 1+ to 2 in Ue's this Am and 3 in RLE- MAS worse this AM- 2-3 throughout Neurologic: Cranial nerves II through XII intact, motor strength is 4/5 in bilateral  bicep, tricep, grip,and finger ext and hand intrinsics, 3- RIght delt and 4/5 left delt, 3- bilateral  hip flexor, 3- RIght and 4- left  knee extensors, 4/5 bilateral ankle dorsiflexor and plantar flexor Sensory exam normal sensation to light touch and proprioception in bilateral upper and lower extremities Tone- 3+ a bilateral biceps triceps and BR, + Hoffman's bilateral 2/5 RIght 3+ left knee Also MAS 3 tone in RIght Knee flexors and extensors, MAS 1 at the left knee Clonus at bilateral ankles  Musculoskeletal: Full range of motion in all 4 extremities. No joint swelling      Assessment/Plan: 1. Functional deficits which require 3+ hours per day of interdisciplinary therapy in a comprehensive inpatient rehab setting. Physiatrist is providing close team supervision and 24 hour management of active medical problems listed below. Physiatrist and rehab team continue to assess barriers  to discharge/monitor patient progress toward functional and medical goals  Care Tool:  Bathing              Bathing assist Assist Level: Maximal Assistance - Patient 24 - 49%     Upper Body Dressing/Undressing Upper body dressing   What is the patient wearing?: Pull over shirt    Upper body assist Assist Level: Minimal Assistance - Patient > 75%    Lower Body Dressing/Undressing Lower body dressing      What is the patient wearing?: Underwear/pull up, Pants     Lower body assist Assist for lower body dressing: Dependent - Patient 0%     Toileting Toileting    Toileting assist Assist for toileting: Dependent - Patient 0%     Transfers Chair/bed transfer  Transfers assist     Chair/bed transfer assist level: Moderate Assistance - Patient 50 - 74%     Locomotion Ambulation   Ambulation assist   Ambulation activity did not occur: Safety/medical concerns  Assist level: 2 helpers Assistive device: [name] Max distance: 10'   Walk 10 feet activity   Assist  Walk 10 feet activity did not occur: Safety/medical concerns  Assist level: 2 helpers Assistive device: Walker-Eva   Walk 50 feet activity   Assist Walk 50 feet with 2 turns activity did not occur: Safety/medical concerns         Walk 150 feet activity   Assist Walk 150 feet activity did not occur: Safety/medical concerns         Walk 10 feet on uneven surface  activity   Assist Walk 10 feet on uneven surfaces activity did not occur: Safety/medical concerns         Wheelchair     Assist Is the patient using a wheelchair?: Yes Type of Wheelchair: Power    Wheelchair assist level: Supervision/Verbal cueing Max wheelchair distance: 150'    Wheelchair 50 feet with 2 turns activity    Assist        Assist Level: Supervision/Verbal cueing   Wheelchair 150 feet activity     Assist      Assist Level: Supervision/Verbal cueing   Blood pressure (!)  128/55, pulse (!) 55, temperature 97.8 F (36.6 C), temperature source Oral, resp. rate 18, height 5\' 4"  (1.626 m), weight 60.6 kg, SpO2 97%.  Medical Problem List and Plan: 1. 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  Antithrombotics: -DVT/anticoagulation:  Mechanical: Sequential compression devices, below knee Bilateral lower extremities 4/23- will see if Dr Ellery Guthrie will allow us  to start Lovenox - he said yes, so will start. 30mg  daily. 5/14- when leaves, will go on Eliquis for a total of 3 months 2.5 mg BID             -antiplatelet therapy: N/A 3. Pain Management: tylenol  prn. Voltaren  QID, lyrica  50mg  BID, flexeril  PRN 5/8- pain better since started baclofen - didn't need tramadol  5/9- will add Dantrolene  50 mg due to Allergy with Baclofen  at bedtime and see if tolerates- if so, will con't to increase the dose to help spasticity and reduce chance of sedation/side effects  -11/07/23 very delirious yesterday per pt, will stop baclofen  and leave Dantrolene  for now; monitor 5/19- pain doing well as long as turned and gets prevalon boots 5/20- pt reports pain still on backside and R heel 5/21- pain meds working -w hen takes Tylenol   5/22- Tylenol  works for sacral and R heel pain 5/23- Stander was extremely effective for Spasticity pain in legs! 4. Mood/Behavior/Sleep: LCSW to follow up for evaluation and support when appropriate --Delirium precautions--His mental status has cleared but will monitor for worsening with transfer to new unit .pt has not required prn meds for agitation in several days   Monitor sleep wake cycle             -antipsychotic agents: Change haldol  q 6 hrs prn to Seroquel  12.5mg  tid prn  -Restoril  7.5mg  nightly, cymbalta  20mg  daily 4/23-4/25 no delirium so far 4/29- still confused this AM-  5/9 Aox3 this AM -11/07/23 delirious yesterday, stopped baclofen  as above. 5/14- resolved  5. Neuropsych/cognition: This patient is usually  capable of making decisions on his own behalf. 6. Skin/Wound Care: Routine pressure relief measures. 5/20- Has Stage II on backside- now has minor breakdown per nursing- and R heel DTI- has prevalons- pt to be turned q2 hours- couldn't tolerate air bed.   5/21- turning with new wedge we got him 5/22- pt reports with new wedge, less discomfort on sacrum- however R DTI still uncomfortable constantly- will d/w nursing about turning him maybe MORE to the side- so heels not down all the time?  5/23- better when leaves a "gap" on is helpful for heel pain- d/w nursing 7. Fluids/Electrolytes/Nutrition:  Monitor I/O. Routine labs. Continue vitamins/supplements. 8. 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 Willye Harvey, per note, leave in place for 7-10d, Consider resuming In-N-Out catheterization for management of his neurogenic bladder at that time. Recommend using a coud catheter for intermittent catheterization (when resumed).  5/7- pt and wife have decided they don't want to remove foley - they don't want ot go back to in/out caths-  since wasn't emptying before- they want a chronic foley- when offered to remove foley just for rehab, to see if he gets return, he is scared will get bleeding again, due to false passage already had and  declined 5/12 Foley draining clear yellow urine 5/16- wife still wants and if going to  SNF, will need to keep 5/20- pt wants to keep Foley- doesn't want to go back to in/out caths 10.  Diabetes- A1c 8.0-  with peripheral neuropathy by hx only on Jardiance at home, use SSI in the hospital  -10/23/23 CBGs variable but likely d/t steroids; cont SSI for now 4/29- CBG's very labile in last 24 hours 99-383- - it concerns me to start Jardiance since of Foley and risk of UTI's- also for starting Tradjenta  since BG was 99 last night- don't want to drop BG too much 4/30- Will add Tradjenta  since BG's still running 150-242- will monitor closely for CBG hypoglycemia-   10/30/23 CBGs looking great!  -10/31/23 CBGs a little higher the last day, but monitor for trend  5/5- Cbgs all less than 200- con't to monitor  5/6- CBGs 130s- 180s- 5/7-5/9 CBG's 128 to 220- had snack yesterday- still snacking in afternoon -5/10-11/25 CBGs great except for afternoon d/t snacks. Leave meds alone for now -5/12 fair glucose control, continue current regimen and monitor. 5/13- 5/15- great control except for afternoon- snacking- will d/w pt 5/16- Cbgs jumping higher- if this continues to occur, will need other meds for CBG's -11/13/23 CBGs so variable but mostly it's the afternoon/evening values; will start carb modified diet as he's on regular diet right now. If not, might need to add other med? -11/14/23 CBGs much better overnight; asked that he limit his sugar (has regular cola next to his bed), hopefully diet alone will control his sugars better; pt aware and agreeable.  5/19-5/22 Cbgs looking much better- con't regimen 5/23-25 CBG's doing well overall- con't regimen CBG (last 3)  Recent Labs    11/20/23 1616 11/20/23 2044 11/21/23 0549  GLUCAP 148* 193* 120*     11.  Spasticity affecting LEs> UE and R>L , aggressive ROM  with PT, consider oral meds  4/22- starting baclofen  5 mg TID- with his Cr 1.50- has been running 1.68  to 2.30  4/24- will not increase baclofen  due to Renal issues 5/9- will add Dantrolene  50 mg at bedtime to reduce chance of spasticity but also reduce chance of side effects/sedation- - will monitor -11/07/23 stopped baclofen  d/t delirium, cont dantrolene  5/20- increased Dantrolene  to 50 mg TID- no side effects at this time 12.  PAD s/p SFA and Iliac stenting, continue metoprolol  50mg  daily  13. CKD3b? With AKI- AKI has resolved  4/28- Cr 1.63- doing better 4/29- Cr 1.59 after 12+ hours of IVF's- and BUN very slightly down to 70 from 72-   5/1- Cr down to 1.43 and BUN down to 53  5/5- Cr 1.51 and BUN down to 37- drinking more!  5/8- Cr 1.8- up from 1.51- but BUN 34 down from 37- will push fluids  5/12 creatinine and BUN much lower at 1.33/26-improved  5/15- Cr 1.68 and BUN 30 this AM- BUN improving- but Cr bounces  5/19- BUN 30 and Cr 1.64  5/22- BUN 33 and Cr 1.45 14. Neurogenic bowel: continue miralax  32g daily, senokot s 2 tabs qAM  5/1- LBM this Am- was accident- might need bowel program 5/20- Bowel program yielded results without dig stim last night- feeling good- con't regimen 15. HLD: crestor  20mg  nightly 16. Delirium/Confusion 4/30- pt Ox3 this AM- very with it- likely flexeril  based on chart review- given for pain since on no pain meds- will keep off Flexeril  and monitor Sx's  5/5- Sx's resolved  -11/07/23 SEE ABOVE 17. Hx of HTN with severe hypotension 5/6-5/11 BP doing better- con't  regimen 5/12 orthostatic hypotension noted this morning, bolus IVF 500cc. 5/13- Will increase Midodrine  to 7.5 mg TID 5/14- 5/15- BP slightly better actually has 1 episode of 155 systolic so wait to increase Midodrine  5/16-5/20- BP doing better- only one episode where BP 110's- con't to monitor 5/22- BP running low 100's systolic when laying down- will d/w therapy if having drops in BP still - if so will increase midodrine  5/23- stood in stander with no drops in BP -5/24-25/25 BPs better,  monitor Vitals:   11/17/23 1252 11/17/23 1940 11/18/23 0623 11/18/23 1358  BP: (!) 117/59 (!) 103/54 (!) 106/55 (!) 126/55   11/18/23 2010 11/19/23 0453 11/19/23 1455 11/19/23 1935  BP: (!) 107/51 (!) 117/56 (!) 142/59 (!) 107/56   11/20/23 0319 11/20/23 1328 11/20/23 1912 11/21/23 0447  BP: 120/65 108/71 (!) 124/55 (!) 128/55     18. Atelectasis 4/30- sounds coarse- asked pt to use ICS regularly- is at bedside- also described "spitting things up"_ had "spit cup"- at bedside-  had barely anything in it. 5/5- not spitting up anymore  19. R heel DTI and Stage I on sacrum 5/2- is new- ordered prevalon boots as well as low air loss mattress and needs to be turned q2 hours- placed order  5/8- pt not able to tolerate air mattress- turn q2 hours and prevalons for Heel DTI- d/w nursing- just removed this AM 5/12 patient asked for device to keep pressure off his heel, Prevalon boots at bedside. Will chek with nursing regarding use of these boots at night.  20. Spasticity  5/5- will d/w wife tomorrow about restarting Baclofen  vs Dantrolene .   5/6- attempted to call wife again x2- cannot get her 5/7- was able to see wife yesterday and discussed- we will retry Pt on Baclofen  5 mg TID for now- not a higher dose due to renal issues-  and monitor for confusion/delirium- if gets confused, will try Dantrolene .   5/8- no confusion and RLE much less pain- so think it's working 5/9- will add Dantrolene  as above 5/14- Increased dantrolene  to 50 mg BID 5/20- increase Dantrolene  to 50 mg TID- is helping some and no side effects 5/22- feels like spasticity is improving mildly-  21. Severe malnutrition  5/5- per Dietitian- will do supplements 22. Mild tachycardia 5/8- per pt, has ALWAYS had tachycardia- has been worked up- and nothing found- usually 90s-100's per pt- which is the same here- will monitor 23. Ileus??? 5/13- reached out to Radiology about pt's questionable ileus- not clear in Radiology  report  5/14- doesn't have ileus- but full of gas- started Simethicone  QID  5/20- make Gas-X prn per pt request 24. Nasal congestion with mild cough 5/15- probably due to post nasal drip- will start Flonase  2 sprays each nare daily.   5/16- says nasal congestion much better- won't need Flonase - will make prn 25. Leukocytosis 5/19- no signs of infection, no fever, no confusion- will recheck in AM unless he feels ill today.   5/20- WBC down to 11k- will con't to monitor- and recheck Thursday  5/22- WBC down to 8.8- no fever or other Sx's    LOS: 34 days A FACE TO FACE EVALUATION WAS PERFORMED  [address] 11/21/2023, 10:01 AM

## 2023-11-22 LAB — CBC WITH DIFFERENTIAL/PLATELET
Abs Immature Granulocytes: 0.14 10*3/uL — ABNORMAL HIGH (ref 0.00–0.07)
Basophils Absolute: 0.1 10*3/uL (ref 0.0–0.1)
Basophils Relative: 1 %
Eosinophils Absolute: 0.2 10*3/uL (ref 0.0–0.5)
Eosinophils Relative: 2 %
HCT: 28.3 % — ABNORMAL LOW (ref 39.0–52.0)
Hemoglobin: 9 g/dL — ABNORMAL LOW (ref 13.0–17.0)
Immature Granulocytes: 1 %
Lymphocytes Relative: 10 %
Lymphs Abs: 1.2 10*3/uL (ref 0.7–4.0)
MCH: 29 pg (ref 26.0–34.0)
MCHC: 31.8 g/dL (ref 30.0–36.0)
MCV: 91.3 fL (ref 80.0–100.0)
Monocytes Absolute: 0.7 10*3/uL (ref 0.1–1.0)
Monocytes Relative: 6 %
Neutro Abs: 9.2 10*3/uL — ABNORMAL HIGH (ref 1.7–7.7)
Neutrophils Relative %: 80 %
Platelets: 424 10*3/uL — ABNORMAL HIGH (ref 150–400)
RBC: 3.1 MIL/uL — ABNORMAL LOW (ref 4.22–5.81)
RDW: 14.6 % (ref 11.5–15.5)
WBC: 11.5 10*3/uL — ABNORMAL HIGH (ref 4.0–10.5)
nRBC: 0 % (ref 0.0–0.2)

## 2023-11-22 LAB — BASIC METABOLIC PANEL WITH GFR
Anion gap: 8 (ref 5–15)
BUN: 33 mg/dL — ABNORMAL HIGH (ref 8–23)
CO2: 26 mmol/L (ref 22–32)
Calcium: 8.5 mg/dL — ABNORMAL LOW (ref 8.9–10.3)
Chloride: 105 mmol/L (ref 98–111)
Creatinine, Ser: 1.54 mg/dL — ABNORMAL HIGH (ref 0.61–1.24)
GFR, Estimated: 45 mL/min — ABNORMAL LOW (ref >60)
Glucose, Bld: 129 mg/dL — ABNORMAL HIGH (ref 70–99)
Potassium: 3.9 mmol/L (ref 3.5–5.1)
Sodium: 139 mmol/L (ref 135–145)

## 2023-11-22 LAB — GLUCOSE, CAPILLARY
Glucose-Capillary: 137 mg/dL — ABNORMAL HIGH (ref 70–99)
Glucose-Capillary: 166 mg/dL — ABNORMAL HIGH (ref 70–99)
Glucose-Capillary: 149 mg/dL — ABNORMAL HIGH (ref 70–99)
Glucose-Capillary: 131 mg/dL — ABNORMAL HIGH (ref 70–99)

## 2023-11-22 MED ORDER — ENSURE ENLIVE PO LIQD: 237.0000 mL | ORAL | Status: DC

## 2023-11-22 NOTE — Progress Notes (Signed)
 IP Rehab Bowel Program Documentation   Bowel Program Start time [phone]   Dig Stim Indicated? No  Dig Stim Prior to Suppository or mini Enema X 0   Output from dig stim: N/A  Ordered intervention: Suppository Yes , mini enema No ,   Repeat dig stim after Suppository or Mini enema  X {Numbers; 1-5:17750},  Output? {Desc; minimal/small/moderate/large/very large:110034}   Bowel Program Complete? {YES/NO:21197}, handoff given ***  Patient Tolerated? {YES/NO:21197}

## 2023-11-22 NOTE — Progress Notes (Signed)
 Occupational Therapy Session Note  Patient Details  Name: Christian Bailey MRN: [id number] Date of Birth: Jun 05, 1941  Today's Date: 11/22/2023 OT Individual Time: 0900-1000 OT Individual Time Calculation (min): 60 min    Short Term Goals: Week 5:  OT Short Term Goal 1 (Week 5): Pt will complete bathing at Mod A with AE as necessary OT Short Term Goal 2 (Week 5): Pt will complete bed mobility at Mod A in order to prepare for ADLs OT Short Term Goal 3 (Week 5): Pt will complete sit to stand with Mod A consistently throughout sessions within stedy  Skilled Therapeutic Interventions/Progress Updates:      Therapy Documentation Precautions:  Precautions Precautions: Fall, Cervical Precaution/Restrictions Comments: decreased recall of precautions, not able to verbalize any when prompted Required Braces or Orthoses: Cervical Brace Cervical Brace: Soft collar Restrictions Weight Bearing Restrictions Per Provider Order: No General: "Did I tell you this joke yet?" Pt supine in bed upon OT arrival, agreeable to OT session.  Pain:  4/10 pain/soreness reported in BLE, activity, intermittent rest breaks, distractions provided for pain management, pt reports tolerable to proceed. OT providing AAROM stretching in all planes for BLE pain and tone management.   ADL: OT providing skilled intervention on ADL retraining in order to increase independence with tasks and increase activity tolerance. Pt completed the following tasks at the current level of assist: Bed mobility: Max A overall, able to maintain side laying with assistance from bed rail, AAROM with BLE legs off of bed, able to instruct pt to have BUE assist in rising to EOB Grooming/oral hygiene: set up, pt seated unsupported in W/C at sink, able to reach items on sink in order to perform hygiene tasks UB dressing: Mod A overall, assistance required overhead and down back LB dressing: total A bed level, rolling Lt and Rt to manage over  waist Footwear: total A to doff prevlon boots and don tennis shoes Transfers: Mod A to stand from raised bed, Max A stand step transfer to W/C using bear hug technique  Pt seated in W/C in pressure offloading position at end of session with W/C alarm donned, call light within reach and 4Ps assessed.   Therapy/Group: Individual Therapy  Nila Barth, OTD, OTR/L 11/22/2023, 12:56 PM

## 2023-11-22 NOTE — Progress Notes (Addendum)
 PROGRESS NOTE   Subjective/Complaints:  Pt reports no issues with foley.  RLE aching- wants salve to help- is out of diclofenac  and low on muscle rub at bedside.  Had small BM last night and then another one overnight- notes doesn't feel constipated.   R heel pain not as bad- little irritated.   ROS:   Pt denies SOB, abd pain, CP, N/V/C/D, and vision changes  + Heel discomfort- on R side- still evident and buttock/coccyx pain- slightly better + nausea/vomiting- -hasn't recurred   Objective:   No results found.    Recent Labs    11/22/23 0619  WBC 11.5*  HGB 9.0*  HCT 28.3*  PLT 424*        Recent Labs    11/22/23 0619  NA 139  K 3.9  CL 105  CO2 26  GLUCOSE 129*  BUN 33*  CREATININE 1.54*  CALCIUM  8.5*         Intake/Output Summary (Last 24 hours) at 11/22/2023 0844 Last data filed at 11/22/2023 1610 Gross per 24 hour  Intake 575 ml  Output 700 ml  Net -125 ml     Pressure Injury 10/28/23 Sacrum Medial Stage 2 -  Partial thickness loss of dermis presenting as a shallow open injury with a red, pink wound bed without slough. STAGE 1 ON SACRUM (Active)  10/28/23 1333  Location: Sacrum  Location Orientation: Medial  Staging: Stage 2 -  Partial thickness loss of dermis presenting as a shallow open injury with a red, pink wound bed without slough.  Wound Description (Comments): STAGE 1 ON SACRUM  Present on Admission: No     Pressure Injury 10/28/23 Heel Right Deep Tissue Pressure Injury - Purple or maroon localized area of discolored intact skin or blood-filled blister due to damage of underlying soft tissue from pressure and/or shear. DTI on bottom of heel (Active)  10/28/23 1820  Location: Heel  Location Orientation: Right  Staging: Deep Tissue Pressure Injury - Purple or maroon localized area of discolored intact skin or blood-filled blister due to damage of underlying soft tissue from  pressure and/or shear.  Wound Description (Comments): DTI on bottom of heel  Present on Admission: No    Physical Exam: Vital Signs Blood pressure 106/73, pulse (!) 106, temperature 97.9 F (36.6 C), temperature source Oral, resp. rate 18, height 5\' 4"  (1.626 m), weight 60.6 kg, SpO2 98%.     General: awake, alert, appropriate, sitting up in bed; NAD HENT: conjugate gaze; oropharynx moist CV: regular  rhythm, mildly tachycardic rate; no JVD Pulmonary: CTA B/L; no W/R/R- good air movement GI: soft, NT, ND, (+)BS Psychiatric: appropriate but appears more depressed this AM Neurological: Ox3 Less spasticity in RLE this AM- MAS of 1+  GU- light yellow amber urine in foley bag Wearing prevalons B/L and propped slightly to left side with wedge  PRIOR EXAMS: MAS of 1+ to 2 in RLE- no clonus  Skin: Heels covered with foam dressings B/L- R heel DTI and Sacral Stage II     Prior exam MSK: Ue's 4/5 B/L throughout LE's- 2-/5 B/L-in HF/KE- 4-/5 DF/PF but RLE weaker than LLE Spasticity- MAS of 1+ to 2  in Ue's this Am and 3 in RLE- MAS worse this AM- 2-3 throughout Neurologic: Cranial nerves II through XII intact, motor strength is 4/5 in bilateral  bicep, tricep, grip,and finger ext and hand intrinsics, 3- RIght delt and 4/5 left delt, 3- bilateral  hip flexor, 3- RIght and 4- left knee extensors, 4/5 bilateral ankle dorsiflexor and plantar flexor Sensory exam normal sensation to light touch and proprioception in bilateral upper and lower extremities Tone- 3+ a bilateral biceps triceps and BR, + Hoffman's bilateral 2/5 RIght 3+ left knee Also MAS 3 tone in RIght Knee flexors and extensors, MAS 1 at the left knee Clonus at bilateral ankles  Musculoskeletal: Full range of motion in all 4 extremities. No joint swelling      Assessment/Plan: 1. Functional deficits which require 3+ hours per day of interdisciplinary therapy in a comprehensive inpatient rehab setting. Physiatrist is  providing close team supervision and 24 hour management of active medical problems listed below. Physiatrist and rehab team continue to assess barriers to discharge/monitor patient progress toward functional and medical goals  Care Tool:  Bathing              Bathing assist Assist Level: Maximal Assistance - Patient 24 - 49%     Upper Body Dressing/Undressing Upper body dressing   What is the patient wearing?: Pull over shirt    Upper body assist Assist Level: Minimal Assistance - Patient > 75%    Lower Body Dressing/Undressing Lower body dressing      What is the patient wearing?: Underwear/pull up, Pants     Lower body assist Assist for lower body dressing: Dependent - Patient 0%     Toileting Toileting    Toileting assist Assist for toileting: Dependent - Patient 0%     Transfers Chair/bed transfer  Transfers assist     Chair/bed transfer assist level: Moderate Assistance - Patient 50 - 74%     Locomotion Ambulation   Ambulation assist   Ambulation activity did not occur: Safety/medical concerns  Assist level: 2 helpers Assistive device: [name] Max distance: 10'   Walk 10 feet activity   Assist  Walk 10 feet activity did not occur: Safety/medical concerns  Assist level: 2 helpers Assistive device: Walker-Eva   Walk 50 feet activity   Assist Walk 50 feet with 2 turns activity did not occur: Safety/medical concerns         Walk 150 feet activity   Assist Walk 150 feet activity did not occur: Safety/medical concerns         Walk 10 feet on uneven surface  activity   Assist Walk 10 feet on uneven surfaces activity did not occur: Safety/medical concerns         Wheelchair     Assist Is the patient using a wheelchair?: Yes Type of Wheelchair: Power    Wheelchair assist level: Supervision/Verbal cueing Max wheelchair distance: 150'    Wheelchair 50 feet with 2 turns activity    Assist        Assist  Level: Supervision/Verbal cueing   Wheelchair 150 feet activity     Assist      Assist Level: Supervision/Verbal cueing   Blood pressure 106/73, pulse (!) 106, temperature 97.9 F (36.6 C), temperature source Oral, resp. rate 18, height 5\' 4"  (1.626 m), weight 60.6 kg, SpO2 98%.  Medical Problem List and Plan: 1. 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  Antithrombotics: -DVT/anticoagulation:  Mechanical: Sequential compression devices, below knee Bilateral lower extremities 4/23- will see if Dr Ellery Guthrie will allow us  to start Lovenox - he said yes, so will start. 30mg  daily. 5/14- when leaves, will go on Eliquis for a total of 3 months 2.5 mg BID             -antiplatelet therapy: N/A 3. Pain Management: tylenol  prn. Voltaren  QID, lyrica  50mg  BID, flexeril  PRN 5/8- pain better since started baclofen - didn't need tramadol  5/9- will add Dantrolene  50 mg due to Allergy with Baclofen  at bedtime and see if tolerates- if so, will con't to increase the dose to help spasticity and reduce chance of sedation/side effects  -11/07/23 very delirious yesterday per pt, will stop baclofen  and leave Dantrolene  for now; monitor 5/19- pain doing well as long as turned and gets prevalon boots 5/20- pt reports pain still on backside and R heel 5/21- pain meds working -w hen takes Tylenol   5/22- Tylenol  works for sacral and R heel pain 5/23- Stander was extremely effective for Spasticity pain in legs! 4. Mood/Behavior/Sleep: LCSW to follow up for evaluation and support when appropriate --Delirium precautions--His  mental status has cleared but will monitor for worsening with transfer to new unit .pt has not required prn meds for agitation in several days  Monitor sleep wake cycle             -antipsychotic agents: Change haldol  q 6 hrs prn to Seroquel  12.5mg  tid prn  -Restoril  7.5mg  nightly, cymbalta  20mg  daily 4/23-4/25 no delirium so far 4/29- still confused this AM-  5/9 Aox3 this AM -11/07/23 delirious yesterday, stopped baclofen  as above. 5/14- resolved  5. Neuropsych/cognition: This patient is usually  capable of making decisions on his own behalf. 6. Skin/Wound Care: Routine pressure relief measures. 5/20- Has Stage II on backside- now has minor breakdown per nursing- and R heel DTI- has prevalons- pt to be turned q2 hours- couldn't tolerate air bed.   5/21- turning with new wedge we got him 5/22- pt reports with new wedge, less discomfort on sacrum- however R DTI still uncomfortable constantly- will d/w nursing about turning him maybe MORE to the side- so heels not down all the time?  5/23- better when leaves a "gap" on is helpful for heel pain- d/w nursing  5/26- heel pain doing better- somewhat better 7. Fluids/Electrolytes/Nutrition:  Monitor I/O. Routine labs. Continue vitamins/supplements. 8. 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 Willye Harvey, per note, leave in place for 7-10d, Consider resuming In-N-Out catheterization for management of his neurogenic bladder at that time. Recommend using a coud catheter for intermittent catheterization (when resumed).  5/7- pt and wife have decided they don't want to remove foley - they don't want  ot go back to in/out caths-  since wasn't  emptying before- they want a chronic foley- when offered to remove foley just for rehab, to see if he gets return, he is scared will get bleeding again, due to false passage already had and declined 5/12 Foley draining clear yellow urine 5/16- wife still wants and if going to SNF, will need to keep 5/20- pt wants to keep Foley- doesn't want to go back to in/out caths 10.  Diabetes- A1c 8.0-  with peripheral neuropathy by hx only on Jardiance at home, use SSI in the hospital  -10/23/23 CBGs variable but likely d/t steroids; cont SSI for now 4/29- CBG's very labile in last 24 hours 99-383- - it concerns me to start Jardiance since of Foley and risk of UTI's- also for starting Tradjenta  since BG was 99 last night- don't want to drop BG too much 4/30- Will add Tradjenta  since BG's still running 150-242- will monitor closely for CBG hypoglycemia-   10/30/23 CBGs looking great!  -10/31/23 CBGs a little higher the last day, but monitor for trend  5/5- Cbgs all less than 200- con't to monitor  5/6- CBGs 130s- 180s- 5/7-5/9 CBG's 128 to 220- had snack yesterday- still snacking in afternoon -5/10-11/25 CBGs great except for afternoon d/t snacks. Leave meds alone for now -5/12 fair glucose control, continue current regimen and monitor. 5/13- 5/15- great control except for afternoon- snacking- will d/w pt 5/16- Cbgs jumping higher- if this continues to occur, will need other meds for CBG's -11/13/23 CBGs so variable but mostly it's the afternoon/evening values; will start carb modified diet as he's on regular diet right now. If not, might need to add other med? -11/14/23 CBGs much better overnight; asked that he limit his sugar (has regular cola next to his bed), hopefully diet alone will control his sugars better; pt aware and agreeable.  5/19-5/22 Cbgs looking much better- con't regimen 5/26- CBG's well controlled- con't regimen CBG (last 3)  Recent Labs    11/21/23 1639 11/21/23 2042 11/22/23 0551  GLUCAP  104* 162* 137*     11.  Spasticity affecting LEs> UE and R>L , aggressive ROM  with PT, consider oral meds  4/22- starting baclofen  5 mg TID- with his Cr 1.50- has been running 1.68 to 2.30  4/24- will not increase baclofen  due to Renal issues 5/9- will add Dantrolene  50 mg at bedtime to reduce chance of spasticity but also reduce chance of side effects/sedation- - will monitor -11/07/23 stopped baclofen  d/t delirium, cont dantrolene  5/20- increased Dantrolene  to 50 mg TID- no side effects at this time 12.  PAD s/p SFA and Iliac stenting, continue metoprolol  50mg  daily  13. CKD3b? With AKI- AKI has resolved  4/28- Cr 1.63- doing better 4/29- Cr 1.59 after 12+ hours of IVF's- and BUN very slightly down to 70 from 72-   5/1- Cr down to 1.43 and BUN down to 53  5/5- Cr 1.51 and BUN down to 37- drinking more!  5/8- Cr 1.8- up from 1.51- but BUN 34 down from 37- will push fluids  5/12 creatinine and BUN much lower at 1.33/26-improved  5/15- Cr 1.68 and BUN 30 this AM- BUN improving- but Cr bounces  5/19- BUN 30 and Cr 1.64  5/22- BUN 33 and Cr 1.45  5/26- BUN 33 and Cr 1.54 14. Neurogenic bowel: continue miralax  32g daily, senokot s 2 tabs qAM  5/1- LBM this Am- was accident- might need bowel program 5/20- Bowel program yielded  results without dig stim last night- feeling good- con't regimen 15. HLD: crestor  20mg  nightly 16. Delirium/Confusion 4/30- pt Ox3 this AM- very with it- likely flexeril  based on chart review- given for pain since on no pain meds- will keep off Flexeril  and monitor Sx's  5/5- Sx's resolved  -11/07/23 SEE ABOVE 17. Hx of HTN with severe hypotension 5/6-5/11 BP doing better- con't regimen 5/12 orthostatic hypotension noted this morning, bolus IVF 500cc. 5/13- Will increase Midodrine  to 7.5 mg TID 5/14- 5/15- BP slightly better actually has 1 episode of 155 systolic so wait to increase Midodrine  5/16-5/20- BP doing better- only one episode where BP 110's- con't to  monitor 5/22- BP running low 100's systolic when laying down- will d/w therapy if having drops in BP still - if so will increase midodrine  5/23- stood in stander with no drops in BP -5/24 5/26-  BPs better, monitor Vitals:   11/18/23 1358 11/18/23 2010 11/19/23 0453 11/19/23 1455  BP: (!) 126/55 (!) 107/51 (!) 117/56 (!) 142/59   11/19/23 1935 11/20/23 0319 11/20/23 1328 11/20/23 1912  BP: (!) 107/56 120/65 108/71 (!) 124/55   11/21/23 0447 11/21/23 1452 11/21/23 1941 11/22/23 0432  BP: (!) 128/55 (!) 115/51 132/79 106/73     18. Atelectasis 4/30- sounds coarse- asked pt to use ICS regularly- is at bedside- also described "spitting things up"_ had "spit cup"- at bedside-  had barely anything in it. 5/5- not spitting up anymore  19. R heel DTI and Stage I on sacrum 5/2- is new- ordered prevalon boots as well as low air loss mattress and needs to be turned q2 hours- placed order  5/8- pt not able to tolerate air mattress- turn q2 hours and prevalons for Heel DTI- d/w nursing- just removed this AM 5/12 patient asked for device to keep pressure off his heel, Prevalon boots at bedside. Will chek with nursing regarding use of these boots at night.  20. Spasticity  5/5- will d/w wife tomorrow about restarting Baclofen  vs Dantrolene .   5/6- attempted to call wife again x2- cannot get her 5/7- was able to see wife yesterday and discussed- we will retry Pt on Baclofen  5 mg TID for now- not a higher dose due to renal issues-  and monitor for confusion/delirium- if gets confused, will try Dantrolene .   5/8- no confusion and RLE much less pain- so think it's working 5/9- will add Dantrolene  as above 5/14- Increased dantrolene  to 50 mg BID 5/20- increase Dantrolene  to 50 mg TID- is helping some and no side effects 5/22- feels like spasticity is improving mildly-  21. Severe malnutrition  5/5- per Dietitian- will do supplements 22. Mild tachycardia 5/8- per pt, has ALWAYS had tachycardia- has been  worked up- and nothing found- usually 90s-100's per pt- which is the same here- will monitor 23. Ileus??? 5/13- reached out to Radiology about pt's questionable ileus- not clear in Radiology report  5/14- doesn't have ileus- but full of gas- started Simethicone  QID  5/20- make Gas-X prn per pt request 24. Nasal congestion with mild cough 5/15- probably due to post nasal drip- will start Flonase  2 sprays each nare daily.   5/16- says nasal congestion much better- won't need Flonase - will make prn 25. Leukocytosis 5/19- no signs of infection, no fever, no confusion- will recheck in AM unless he feels ill today.   5/20- WBC down to 11k- will con't to monitor- and recheck Thursday  5/22- WBC down to 8.8- no fever or other Sx's  5/26- WBC up to 11.5- will recheck in AM- no Sx's of URI/UTI    LOS: 35 days A FACE TO FACE EVALUATION WAS PERFORMED  Nashon Erbes 11/22/2023, 8:44 AM

## 2023-11-22 NOTE — Progress Notes (Signed)
 Nutrition Follow-up  DOCUMENTATION CODES:   Severe malnutrition in context of chronic illness  INTERVENTION:   Continue Prosource Plus 30 ml PO once daily, each packet provides 100 kcal and 15 gm protein. Offer Ensure Plus High Protein po once daily, each supplement provides 350 kcal and 20 grams of protein. Continue MVI daily. Continue vitamin C  500 mg daily x 30 days to support wound healing. Continue zinc  sulfate 220 mg x 14 days total to support wound healing. Today will be the last dose. Supplementing zinc  for > 14 days can cause copper deficiency.   NUTRITION DIAGNOSIS:   Severe Malnutrition related to chronic illness (cervical myelopathy) as evidenced by severe muscle depletion, percent weight loss (15% weight loss x 6 months).  Ongoing   GOAL:   Patient will meet greater than or equal to 90% of their needs  Progressing   MONITOR:   PO intake, Supplement acceptance, Skin  REASON FOR ASSESSMENT:   Consult Wound healing  ASSESSMENT:   83 yo male admitted with acute incomplete quadriplegia with R>L LE weakness d/t cervical myelopathy S/P C5-7 ACDF on 4/15. PMH includes osteoarthritis, HLD, anemia, HTN, DM-2, severe PAD, macular degeneration/retinal detachment, polyneuropathy, recurrent falls, GERD.  Patient is currently on a carb modified diet, meal intakes documented at 40-100% with an average of 80% meal completion for the past 3 days. He is taking Prosource Plus daily. He has only accepted 1 Ensure supplement over the past 3 days. He is taking MVI, vitamin C , and zinc  sulfate to support wound healing. WOC RN following. R heel DTI is stable measuring 3 cm x 2 cm.  Labs reviewed.  CBG: 137-166  Medications reviewed and include vitamin C , colace, ferrous sulfate , novolog , tradjenta , MVI with minerals, protonix , senokot-s, flomax , zinc  sulfate.  No new weight available since admission.  Diet Order:   Diet Order             Diet Carb Modified Fluid consistency:  Thin; Room service appropriate? Yes with Assist  Diet effective now                   EDUCATION NEEDS:   Education needs have been addressed  Skin:  Skin Assessment: Skin Integrity Issues: Skin Integrity Issues:: DTI, Stage I DTI: R heel Stage I: sacrum Stage II: -  Last BM:  5/26 type 6  Height:   Ht Readings from Last 1 Encounters:  10/18/23 5\' 4"  (1.626 m)    Weight:   Wt Readings from Last 1 Encounters:  10/18/23 60.6 kg    Ideal Body Weight:  59.1 kg  BMI:  Body mass index is 22.93 kg/m.  Estimated Nutritional Needs:   Kcal:  1700-1900  Protein:  85-95 gm  Fluid:  1.7-1.9 L   Barnet Boots RD, LDN, CNSC Contact via secure chat. If unavailable, use group chat "RD Inpatient."

## 2023-11-22 NOTE — Consult Note (Signed)
 WOC Nurse wound follow up Wound type:Right heel deep tissue injury.  Intact maroon discoloration   Measurement: 3 cm x 2 cm intact maroon discoloration, stable at this time Wound EAV:[id number] Drainage (amount, consistency, odor) none Periwound: intact  Prevalon boots in place bilaterally.   Dressing procedure/placement/frequency: Continue foam dressing  Change every 3 days and PRN  Will follow.  Branda Cain MSN, RN, FNP-BC CWON Wound, Ostomy, Continence Nurse Outpatient [facility] [phone] Pager [phone]

## 2023-11-22 NOTE — Progress Notes (Signed)
 Physical Therapy Session Note  Patient Details  Name: Christian Bailey MRN: [id number] Date of Birth: Oct 06, 1940  Today's Date: 11/22/2023 PT Individual Time: 1400-1457 PT Individual Time Calculation (min): 57 min   Short Term Goals: Week 4:  PT Short Term Goal 1 (Week 4): Pt will be able to progress lateral leans for slideboard placement with max A PT Short Term Goal 1 - Progress (Week 4): Met PT Short Term Goal 2 (Week 4): Pt will be able to perform rolling with bed rail features with with mod assist PT Short Term Goal 2 - Progress (Week 4): Met PT Short Term Goal 3 (Week 4): Pt will be able to tolerate WB in standing x 10 min for tone management and NMR PT Short Term Goal 3 - Progress (Week 4): Met Week 5:  PT Short Term Goal 1 (Week 5): Pt will be able to perform lateral leans for slideboard placement with mod assist PT Short Term Goal 2 (Week 5): Pt will be able to perform sit <> stands with max A PT Short Term Goal 3 (Week 5): Pt will be able to perform standing without lift equipment x 2 min for NMR for WB, tone management, and functional strengthening   Skilled Therapeutic Interventions/Progress Updates:    Pt presents up in w/c asleep but easily awaken. Pt drove PWC x 200' on unit to and from day room with supervision, adjusted speed by PT. Pt prefers therapist to position for transfers for safety due to vision. Performed mod to max A for slideboard transfer from [facility] to Nustep, cues for technique and anterior weightshift. Adduction tone noted in BLE (R >L). Total A to manage BLE on and off of Nustep pedals and during turning. Performed NMR for reciprocal movement pattern retraining in closed chain for tone/spasticity management and strengthening of BUE/BLE and just BLE on level 1-2 x 6 min total with multiple rest breaks as fatigued. Pt was surprised he was able to push through and make it move with just his BLE - due to his sensory impairments, doesn't always realize he does have strength  in BLE albeit weak. Required a second person for slideboard management due to decreased ability to perform adequate lateral lean for placement. Pt utilized stedy for transfer back to bed from elevated height on PWC with max A for sit > stand with facilitation for anterior weightshift and cues for pushing through BLE, lighter max from perched surface to transfer to the bed. Mod assist to return to supine with NMR for pt to initiate fixing his positioning in the bed himself activating BLE and bridging. Performed supine hip abduction x 10 reps x 2 sets and then bridging x 2 sets x 10 reps each to focus on strengthening to improve overall bed mobility techniques and NMR. All needs in reach.    Therapy Documentation Precautions:  Precautions Precautions: Fall, Cervical Precaution/Restrictions Comments: decreased recall of precautions, not able to verbalize any when prompted Required Braces or Orthoses: Cervical Brace Cervical Brace: Soft collar Restrictions Weight Bearing Restrictions Per Provider Order: No  Pain: 2/10 in RLE - no intervention needed at this time.     Therapy/Group: Individual Therapy  Gita Lamb [name], PT, DPT, CBIS  11/22/2023, 3:05 PM

## 2023-11-22 NOTE — Progress Notes (Signed)
 Physical Therapy Session Note  Patient Details  Name: Christian Bailey MRN: [id number] Date of Birth: [birth year]  Today's Date: 11/22/2023 PT Individual Time: 1048-1200 PT Individual Time Calculation (min): 72 min   Short Term Goals: Week 4:  PT Short Term Goal 1 (Week 4): Pt will be able to progress lateral leans for slideboard placement with max A PT Short Term Goal 1 - Progress (Week 4): Met PT Short Term Goal 2 (Week 4): Pt will be able to perform rolling with bed rail features with with mod assist PT Short Term Goal 2 - Progress (Week 4): Met PT Short Term Goal 3 (Week 4): Pt will be able to tolerate WB in standing x 10 min for tone management and NMR PT Short Term Goal 3 - Progress (Week 4): Met  Skilled Therapeutic Interventions/Progress Updates: Pt presents sitting in PWC, reclined back and arousable easily.  Pt performs w/c controls during session except for changing speeds 2/2 vision deficits.  Pt negotiates PWC out of room and don hallways and then positions in pressure-relieving positions when requested.  Pt negotiated on/off elevators  x 2.  Pt negotiated into standing frame and performed standing reaching forward and also glut sets/quad sets in standing x 3' each trial.  Pt states pain in L rib cage and LES so rest periods given.  Pt returned to room and PT assist for completing positioning next to bed, sat belt on and all needs in reach.     Therapy Documentation Precautions:  Precautions Precautions: Fall, Cervical Precaution/Restrictions Comments: decreased recall of precautions, not able to verbalize any when prompted Required Braces or Orthoses: Cervical Brace Cervical Brace: Soft collar Restrictions Weight Bearing Restrictions Per Provider Order: No General:   Vital Signs:   Pain: 3/10 LES, pt does not request any intervention. Pain Assessment Pain Scale: 0-10 Pain Score: 6  Pain Location: Leg    Therapy/Group: Individual Therapy  Doneta Bayman P  Ami Thornsberry 11/22/2023, 12:42 PM

## 2023-11-23 DIAGNOSIS — L89896 Pressure-induced deep tissue damage of other site: Secondary | ICD-10-CM | POA: Insufficient documentation

## 2023-11-23 DIAGNOSIS — L89306 Pressure-induced deep tissue damage of unspecified buttock: Secondary | ICD-10-CM | POA: Insufficient documentation

## 2023-11-23 LAB — CBC WITH DIFFERENTIAL/PLATELET
Abs Immature Granulocytes: 0.14 10*3/uL — ABNORMAL HIGH (ref 0.00–0.07)
Basophils Absolute: 0.1 10*3/uL (ref 0.0–0.1)
Basophils Relative: 1 %
Eosinophils Absolute: 0.2 10*3/uL (ref 0.0–0.5)
Eosinophils Relative: 2 %
HCT: 27.5 % — ABNORMAL LOW (ref 39.0–52.0)
Hemoglobin: 8.7 g/dL — ABNORMAL LOW (ref 13.0–17.0)
Immature Granulocytes: 1 %
Lymphocytes Relative: 13 %
Lymphs Abs: 1.3 10*3/uL (ref 0.7–4.0)
MCH: 29.7 pg (ref 26.0–34.0)
MCHC: 31.6 g/dL (ref 30.0–36.0)
MCV: 93.9 fL (ref 80.0–100.0)
Monocytes Absolute: 0.6 10*3/uL (ref 0.1–1.0)
Monocytes Relative: 6 %
Neutro Abs: 7.6 10*3/uL (ref 1.7–7.7)
Neutrophils Relative %: 77 %
Platelets: 394 10*3/uL (ref 150–400)
RBC: 2.93 MIL/uL — ABNORMAL LOW (ref 4.22–5.81)
RDW: 14.6 % (ref 11.5–15.5)
WBC: 9.9 10*3/uL (ref 4.0–10.5)
nRBC: 0 % (ref 0.0–0.2)

## 2023-11-23 LAB — GLUCOSE, CAPILLARY
Glucose-Capillary: 109 mg/dL — ABNORMAL HIGH (ref 70–99)
Glucose-Capillary: 189 mg/dL — ABNORMAL HIGH (ref 70–99)
Glucose-Capillary: 152 mg/dL — ABNORMAL HIGH (ref 70–99)
Glucose-Capillary: 96 mg/dL (ref 70–99)

## 2023-11-23 MED ORDER — GERHARDT'S BUTT CREAM: 1.0000 | TOPICAL_CREAM | Freq: Three times a day (TID) | CUTANEOUS | Status: DC

## 2023-11-23 MED ORDER — SENNOSIDES-DOCUSATE SODIUM 8.6-50 MG PO TABS
2.0000 | ORAL_TABLET | Freq: Every day | ORAL | Status: DC
Start: 2023-11-24 — End: 2024-02-13

## 2023-11-23 MED ORDER — PROSOURCE PLUS PO LIQD: 30.0000 mL | Freq: Every day | ORAL | Status: DC

## 2023-11-23 MED ORDER — LINAGLIPTIN 5 MG PO TABS: 5.0000 mg | ORAL_TABLET | Freq: Every day | ORAL | Status: DC

## 2023-11-23 MED ORDER — MELATONIN 5 MG PO TABS: 5.0000 mg | ORAL_TABLET | Freq: Every evening | ORAL | Status: DC | PRN

## 2023-11-23 MED ORDER — DOCUSATE SODIUM 100 MG PO CAPS: 200.0000 mg | ORAL_CAPSULE | Freq: Every day | ORAL | Status: DC

## 2023-11-23 MED ORDER — MUSCLE RUB 10-15 % EX CREA: 1.0000 | TOPICAL_CREAM | CUTANEOUS | Status: DC | PRN

## 2023-11-23 MED ORDER — ENSURE ENLIVE PO LIQD
237.0000 mL | ORAL | Status: DC
Start: 2023-11-23 — End: 2024-02-13

## 2023-11-23 MED ORDER — SIMETHICONE 80 MG PO CHEW: 80.0000 mg | CHEWABLE_TABLET | Freq: Four times a day (QID) | ORAL | Status: DC | PRN

## 2023-11-23 MED ORDER — APIXABAN 2.5 MG PO TABS: 2.5000 mg | ORAL_TABLET | Freq: Two times a day (BID) | ORAL | Status: DC

## 2023-11-23 MED ORDER — BISACODYL 10 MG RE SUPP: 10.0000 mg | Freq: Every day | RECTAL | Status: DC

## 2023-11-23 MED ORDER — DICLOFENAC SODIUM 1 % EX GEL: 2.0000 g | Freq: Four times a day (QID) | CUTANEOUS | Status: DC

## 2023-11-23 MED ORDER — ACETAMINOPHEN 325 MG PO TABS: 325.0000 mg | ORAL_TABLET | ORAL | Status: DC | PRN

## 2023-11-23 MED ORDER — FLUTICASONE PROPIONATE 50 MCG/ACT NA SUSP: 2.0000 | Freq: Every day | NASAL | Status: DC | PRN

## 2023-11-23 MED ORDER — MIDODRINE HCL 2.5 MG PO TABS: 7.5000 mg | ORAL_TABLET | Freq: Three times a day (TID) | ORAL | Status: DC

## 2023-11-23 MED ORDER — ASCORBIC ACID 500 MG PO TABS: 500.0000 mg | ORAL_TABLET | Freq: Every day | ORAL | Status: DC

## 2023-11-23 MED ORDER — METOPROLOL SUCCINATE ER 25 MG PO TB24: 12.5000 mg | ORAL_TABLET | Freq: Every day | ORAL | Status: DC

## 2023-11-23 MED ORDER — TAMSULOSIN HCL 0.4 MG PO CAPS: 0.4000 mg | ORAL_CAPSULE | Freq: Every day | ORAL | Status: DC

## 2023-11-23 MED ORDER — FERROUS SULFATE 325 (65 FE) MG PO TABS: 325.0000 mg | ORAL_TABLET | Freq: Every day | ORAL | Status: DC

## 2023-11-23 MED ORDER — DANTROLENE SODIUM 50 MG PO CAPS: 50.0000 mg | ORAL_CAPSULE | Freq: Three times a day (TID) | ORAL | Status: DC

## 2023-11-23 NOTE — Progress Notes (Signed)
 Occupational Therapy Session Note  Patient Details  Name: Christian Bailey MRN: [id number] Date of Birth: [birth year]  Today's Date: 11/23/2023 OT Individual Time: 1000-1055 OT Individual Time Calculation (min): 55 min    Short Term Goals: Week 3:  OT Short Term Goal 1 (Week 3): Pt will complete dynamic sitting with SBA in preparation for ADL tasks OT Short Term Goal 1 - Progress (Week 3): Progressing toward goal OT Short Term Goal 2 (Week 3): Pt will complete bed mobility with Mod A in preparation for ADLs OT Short Term Goal 2 - Progress (Week 3): Not met OT Short Term Goal 3 (Week 3): Pt will complete UB dressing at Min A consistently throughout sessions OT Short Term Goal 3 - Progress (Week 3): Progressing toward goal  Skilled Therapeutic Interventions/Progress Updates:    Pt sleeping in PWC upon arrival and easily aroused. Pt mobilized PWC to gym with supervision. OTA made adjustements for comfort to head rest of PWC. BUE therex with 2# bar for presses 3x10 and 3# bar for curls 3x10. Seated marching in PWC 3x10 and knee extensions 3x10 with each LE. Pt asssited with repositioning in PWC. Pt returned to room and requested to return to bed. SB transfer with max A. Max A for sit>supine. Pt repositioned and remained in bed with NT present to assist with placement of bed pan.  Therapy Documentation Precautions:  Precautions Precautions: Fall, Cervical Precaution/Restrictions Comments: decreased recall of precautions, not able to verbalize any when prompted Required Braces or Orthoses: Cervical Brace Cervical Brace: Soft collar Restrictions Weight Bearing Restrictions Per Provider Order: No   Pain:  Pt reports Rt heel tender; repositioned   Therapy/Group: Individual Therapy  [name] 11/23/2023, 12:07 PM

## 2023-11-23 NOTE — Progress Notes (Signed)
 Occupational Therapy Discharge Summary  Patient Details  Name: Christian Bailey MRN: [id number] Date of Birth: 02-07-1941  Date of Discharge from OT service:{Time; dates multiple:304500300}  {CHL IP REHAB OT TIME CALCULATIONS:304400400}   Patient has met {NUMBERS 0-12:18577} of {NUMBERS 0-12:18577} long term goals due to {due JX:9147829}.  Patient to discharge at overall {LOA:3049010} level.  Patient's care partner {care partner:[id number]} to provide the necessary {assistance:3041652} assistance at discharge.    Reasons goals not met: ***  Recommendation:  Patient will benefit from ongoing skilled OT services in skilled nursing facility setting to continue to advance functional skills in the area of BADL and Reduce care partner burden.  Equipment: No equipment provided  Reasons for discharge: treatment goals met and discharge from hospital  Patient/family agrees with progress made and goals achieved: Yes  OT Discharge Precautions/Restrictions  Precautions Precautions: Fall;Cervical Required Braces or Orthoses: Cervical Brace Cervical Brace: Soft collar Restrictions Weight Bearing Restrictions Per Provider Order: No General   Vital Signs Therapy Vitals Temp: 97.7 F (36.5 C) Temp Source: Oral Pulse Rate: 82 Resp: 16 BP: (!) 115/52 Patient Position (if appropriate): Lying Oxygen Therapy SpO2: 100 % O2 Device: Room Air Pain Pain Assessment Pain Scale: 0-10 Pain Score: 3  Pain Location: Foot Pain Intervention(s): Medication (See eMAR) ADL ADL Eating: Moderate assistance Grooming: Moderate assistance Where Assessed-Grooming: Wheelchair Upper Body Bathing: Maximal assistance Lower Body Bathing: Maximal assistance Upper Body Dressing: Moderate assistance Where Assessed-Upper Body Dressing: Wheelchair Lower Body Dressing: Dependent Toileting: Dependent Where Assessed-Toileting: Bed level Toilet Transfer: Dependent Toilet Transfer Method: Other (comment)  (stedy) Tub/Shower Transfer: Unable to assess Tub/Shower Transfer Method: Unable to assess Film/video editor Method: Unable to assess Vision   Perception  Perception: Within Functional Limits Praxis Praxis: WFL Cognition Cognition Overall Cognitive Status: Within Functional Limits for tasks assessed Safety/Judgment: Appears intact Sensation Sensation Light Touch: Impaired Detail Light Touch Impaired Details: Impaired RUE;Impaired LUE;Impaired RLE;Impaired LLE Proprioception: Impaired Detail Proprioception Impaired Details: Impaired LLE;Impaired RLE Additional Comments: pt reports h/o peripheral neuropathy but demonstrates decreased sensation in BLE s/p SCI Coordination Gross Motor Movements are Fluid and Coordinated: No Fine Motor Movements are Fluid and Coordinated: No Motor  Motor Motor: Tetraplegia;Abnormal postural alignment and control;Abnormal tone Motor - Discharge Observations: h/o R shoulder RTC injury, new onset of BUE/BLE weakness s/p SCI though has improved in overall strength since admission, tone impacting as well Mobility  Bed Mobility Bed Mobility: Rolling Right;Rolling Left;Supine to Sit;Sit to Supine Rolling Right: Contact Guard/Touching assist Rolling Left: Minimal Assistance - Patient > 75% Supine to Sit: Moderate Assistance - Patient 50-74%;Maximal Assistance - Patient - Patient 25-49% Sit to Supine: Moderate Assistance - Patient 50-74% Transfers Sit to Stand: Moderate Assistance - Patient 50-74%;Maximal Assistance - Patient 25-49% (elevated surface) Stand to Sit: Moderate Assistance - Patient 50-74%  Trunk/Postural Assessment  Cervical Assessment Cervical Assessment: Exceptions to [facility] (soft collar) Thoracic Assessment Thoracic Assessment: Exceptions to [facility] (kyphotic posture, rounded shoulders) Lumbar Assessment Lumbar Assessment: Exceptions to [facility] (posterior pelvic tilt; decreased pelvic mobility) Postural Control Postural Control: Deficits on  evaluation Trunk Control: decreased with large pertubations Righting Reactions: delayed and difficulty with weakness Protective Responses: delayed Postural Limitations: kyphotic posture  Balance Balance Balance Assessed: Yes Static Sitting Balance Static Sitting - Level of Assistance: 5: Stand by assistance Extremity/Trunk Assessment      Tx Session (w/c eval) Pt, Christian Bailey, and Christian Bailey OT present for custom W/C evaluation in order to increase functional mobility in order to complete ADLs and community integration. Pt  will be receiving ***.    Nila Barth, OTD, OTR/L 11/23/2023, 2:55 PM

## 2023-11-23 NOTE — Progress Notes (Signed)
 Patient's back checked for any skin issues, none noted. Updated measurements and pictures taken of current pressure ulcers. MD made aware.

## 2023-11-23 NOTE — Progress Notes (Signed)
 Physical Therapy Discharge Summary  Patient Details  Name: Christian Bailey MRN: [id number] Date of Birth: [birth year]  Date of Discharge from PT service:Nov 23, 2023   Patient has met 6 of 6 long term goals due to improved activity tolerance, improved balance, improved postural control, increased strength, decreased pain, ability to compensate for deficits, functional use of  right upper extremity, right lower extremity, left upper extremity, and left lower extremity, and improved coordination.  Patient to discharge at a wheelchair level mod to max A for transfers and supervision for power w/c mobility.   Patient's care partner unavailable to provide the necessary physical assistance at discharge and therefore d/c plan is SNF. Aaron Aas  Reasons goals not met: n/a - goals met at this time  Recommendation:  Patient will benefit from ongoing skilled PT services in skilled nursing facility setting to continue to advance safe functional mobility, address ongoing impairments in strength, balance, postural control, functional mobility, tone/spasticity management, pain, and minimize fall risk.  Equipment: Speciality seating w/c evaluation. Other equipment to be provided at next venue of care.  Reasons for discharge: treatment goals met and discharge from hospital  Patient/family agrees with progress made and goals achieved: Yes  PT Discharge Precautions/Restrictions Precautions Precautions: Fall;Cervical Required Braces or Orthoses: Cervical Brace Cervical Brace: Soft collar Restrictions Weight Bearing Restrictions Per Provider Order: No  Pain Interference Pain Interference Pain Effect on Sleep: 1. Rarely or not at all Pain Interference with Therapy Activities: 1. Rarely or not at all Pain Interference with Day-to-Day Activities: 1. Rarely or not at all Vision/Perception  Vision - History Baseline Vision: Wears glasses Patient Visual Report: No change from baseline Perception Perception: Within  Functional Limits Praxis Praxis: WFL  Cognition Overall Cognitive Status: Within Functional Limits for tasks assessed Safety/Judgment: Appears intact Sensation Sensation Light Touch: Impaired Detail Light Touch Impaired Details: Impaired RUE;Impaired LUE;Impaired RLE;Impaired LLE Proprioception: Impaired Detail Proprioception Impaired Details: Impaired LLE;Impaired RLE Additional Comments: pt reports h/o peripheral neuropathy but demonstrates decreased sensation in BLE s/p SCI Coordination Gross Motor Movements are Fluid and Coordinated: No Fine Motor Movements are Fluid and Coordinated: No Motor  Motor Motor: Tetraplegia;Abnormal postural alignment and control;Abnormal tone Motor - Discharge Observations: h/o R shoulder RTC injury, new onset of BUE/BLE weakness s/p SCI though has improved in overall strength since admission, tone impacting as well  Mobility Bed Mobility Bed Mobility: Rolling Right;Rolling Left;Supine to Sit;Sit to Supine Rolling Right: Contact Guard/Touching assist Rolling Left: Minimal Assistance - Patient > 75% Supine to Sit: Moderate Assistance - Patient 50-74%;Maximal Assistance - Patient - Patient 25-49% Sit to Supine: Moderate Assistance - Patient 50-74% Transfers Transfers: Sit to Stand;Stand to Sit;Lateral/Scoot Transfers Sit to Stand: Moderate Assistance - Patient 50-74%;Maximal Assistance - Patient 25-49% (elevated surface) Stand to Sit: Moderate Assistance - Patient 50-74% Lateral/Scoot Transfers: Moderate Assistance - Patient 50-74% Transfer (Assistive device): Other (Comment) (slideboard, or use of parallel bars for sit <> stands) Locomotion  Gait Ambulation: Yes Gait Assistance: 2 Helpers Gait Distance (Feet): 10 Feet Assistive device: 2 person hand held assist;Eva walker Stairs / Additional Locomotion Stairs: No Pick up small object from the floor (from standing position) activity did not occur: Safety/medical concerns Radiation protection practitioner Mobility: Yes Wheelchair Assistance: Supervision/Verbal cueing (due to visual impairments) Occupational hygienist: Power Wheelchair Parts Management: Needs assistance Distance: 150  Trunk/Postural Assessment  Cervical Assessment Cervical Assessment: Exceptions to [facility] (soft collar) Thoracic Assessment Thoracic Assessment: Exceptions to [facility] (kyphotic posture, rounded shoulders) Lumbar Assessment Lumbar Assessment: Exceptions to [facility] (  posterior pelvic tilt; decreased pelvic mobility) Postural Control Postural Control: Deficits on evaluation Trunk Control: decreased with large pertubations Righting Reactions: delayed and difficulty with weakness Protective Responses: delayed Postural Limitations: kyphotic posture  Balance Balance Balance Assessed: Yes Static Sitting Balance Static Sitting - Level of Assistance: 5: Stand by assistance Dynamic Sitting Balance Dynamic Sitting - Level of Assistance: 5: Stand by assistance (within modified range functionally) Static Standing Balance Static Standing - Balance Support: Bilateral upper extremity supported Static Standing - Level of Assistance: 2: Max assist;3: Mod assist Dynamic Standing Balance Dynamic Standing - Level of Assistance: 2: Max assist Extremity Assessment  RUE Assessment RUE Assessment: Exceptions to [facility] Passive Range of Motion (PROM) Comments: shoulder flexion 90*, wfl other joints Active Range of Motion (AROM) Comments: shoulder flexion 90*, wfl other joints, prior Rt shoulder injury General Strength Comments: 4-/5 overall LUE Assessment LUE Assessment: Exceptions to [facility] Active Range of Motion (AROM) Comments: shoulder flexion 120*, WFL all other joints, prior shoulder injury General Strength Comments: 4-/5 overall RLE Assessment RLE Assessment: Exceptions to [facility] RLE Tone RLE Tone: Hypertonic;Modified Ashworth Hamstrings - Modified Ashworth Scale for Grading Hypertonia RLE: More marked increase in  muscle tone through most of the ROM, but affected part(s) easily moved LLE Assessment LLE Assessment: Exceptions to [facility] LLE Tone LLE Tone: Hypertonic;Modified Ashworth Modified Ashworth Scale for Grading Hypertonia LLE: Slight increase in muscle tone, manifested by a catch, followed by minimal resistance throughout the remainder (less than half) of the ROM   Faye Hoops, PT, DPT, CBIS  11/23/2023, 3:48 PM

## 2023-11-23 NOTE — Discharge Summary (Signed)
 Physician Discharge Summary  Patient ID: Christian Bailey MRN: [id number] DOB/AGE: 10/12/1940 83 y.o.  Admit date: 10/18/2023 Discharge date: 11/24/2023  Discharge Diagnoses:  Principal Problem:   Acute incomplete quadriplegia [facility]) Active Problems:   Cervical myelopathy (HCC)   Urinary retention   Neurogenic bladder   Transient iatrogenic urethral bleeding   Depression with anxiety   Adjustment disorder with mixed anxiety and depressed mood   Protein-calorie malnutrition, severe   Coping style affecting medical condition   Pressure injury of deep tissue of foot   Pressure injury of deep tissue of buttock   Discharged Condition: stable  Significant Diagnostic Studies: DG Abd 1 View Result Date: 11/08/2023 CLINICAL DATA:  Nausea EXAM: ABDOMEN - 1 VIEW COMPARISON:  None Available. FINDINGS: The bowel gas pattern is normal. No radio-opaque calculi or other significant radiographic abnormality are seen. IMPRESSION: Nonspecific ileus.  No obstruction Electronically Signed   By: Fredrich Jefferson M.D.   On: 11/08/2023 15:11   DG Chest 2 View Result Date: 10/25/2023 CLINICAL DATA:  Altered mental status EXAM: CHEST - 2 VIEW COMPARISON:  None Available. FINDINGS: Lungs are well expanded, symmetric, and clear. No pneumothorax or pleural effusion. Cardiac size within normal limits. Pulmonary vascularity is normal. Osseous structures are age-appropriate. No acute bone abnormality. Right reverse shoulder arthroplasty noted. IMPRESSION: No active cardiopulmonary disease. Electronically Signed   By: Worthy Heads M.D.   On: 10/25/2023 22:06    Labs:  Basic Metabolic Panel:    Latest Ref Rng & Units 11/22/2023    6:19 AM 11/18/2023    6:43 AM 11/15/2023    7:02 AM  BMP  Glucose 70 - 99 mg/dL 409  811  914   BUN 8 - 23 mg/dL 33  33  30   Creatinine 0.61 - 1.24 mg/dL 7.82  9.56  2.13   Sodium 135 - 145 mmol/L 139  140  139   Potassium 3.5 - 5.1 mmol/L 3.9  3.7  3.9   Chloride 98 - 111 mmol/L 105   103  103   CO2 22 - 32 mmol/L 26  26  30    Calcium  8.9 - 10.3 mg/dL 8.5  8.6  8.5      CBC: Recent Labs  Lab 11/18/23 0643 11/22/23 0619 11/23/23 0454  WBC 8.8 11.5* 9.9  NEUTROABS 6.4 9.2* 7.6  HGB 8.5* 9.0* 8.7*  HCT 26.4* 28.3* 27.5*  MCV 90.7 91.3 93.9  PLT 409* 424* 394    CBG: Recent Labs  Lab 11/23/23 0651 11/23/23 1126 11/23/23 1639 11/23/23 2115 11/24/23 0559  GLUCAP 109* 189* 152* 96 114*    Brief HPI:   Christian Bailey is a 83 y.o. male with history of T2DM, severe PAD s/p multiple vascular procedures, macular degeneration/retinal detachment, GI bleed, polyneuropathy, anxiety and depression, 50-month history of weakness with difficulty voiding, recurrent falls who was admitted on 10/02/2023 after a fall where he struck his head against the wall and had complaints of neck and back pain.  He was found to have edematous changes due to acute ligamentous injury CC-C5, diffuse edema posterior cervical musculature, compression of spine at C5/6 and C6/7 due to DDD and ossification of posterior longitudinal ligament.  He was evaluated by Dr. Ellery Guthrie and underwent cervical decompression with anterior cervical corpectomy on 10/12/23.  Hospital course significant for issues with encephalopathy due to delirium, spasticity, leukocytosis as well as pain.  Mentation has improved and per discussion with palliative care, has elected limited DNR status.  Therapy was working  with patient who was requiring max to total assist with mobility and ADLs.  CIR was recommended due to functional decline.   Hospital Course: Christian Bailey was admitted to rehab 10/18/2023 for inpatient therapies to consist of PT and OT at least three hours five days a week. Past admission physiatrist, therapy team and rehab RN have worked together to provide customized collaborative inpatient rehab.  SCDs were used initially for DVT prophylaxis.  BLE Dopplers done were negative for DVT.  He was cleared to start Lovenox  on 04/23  and was transitioned to Eliquis at discharge.  He will need to continue anticoagulation for 3 months total to complete his DVT prophylaxis course.  Blood pressures were monitored on TID basis and he was limited by severe hypotension.  Abdominal binder in teds for use for BP support without improvement therefore midodrine  was added with stabilization of blood pressure.  He was found to have urinary retention requiring In-N-Out catheterizations which were painful and he developed hematuria requiring Foley placement by urology.  Plans were to remove Foley after 7 to 10 days however both patient and wife elected on keeping Foley in for now due to concerns of recurrent hematuria. Foley was changed out on 05/28 prior to discharge.   He continued to have issues with delirium and intermittent confusion.  Sleep-wake disruption has been an issue however he was unable to tolerate Restoril  due to confusion therefore this was discontinued.  Recommend delirium precautions to continue especially with change of environment.  Acute on chronic renal failure treated with IV fluids and encouragement of fluid fluid intake.  Recent check of electrolytes reveals improvement overall with serum creatinine likely at baseline.  Follow-up CBC shows anemia to be stable.  Spasticity has been a limiting factor and baclofen  was added and titrated to 5 mg 3 times daily with renal status limiting dose titration but discontinued as  felt to be cause of confusion. Confusion has resolved and ROM as well as  Dantrium   was added for management of spasticity.  Dantrium  is being titrated up and was increased to 100 mg bid on 05/28.   He has had issues with constipation.  KUB done showed question of ileus.  He was started on bowel program with suppository nightly to help manage constipation.  Muscle soreness due to increase in activity has been managed with use of Voltaren  gel as well as Tylenol  on as needed basis.  Protein supplements as well as  vitamin C  and zinc  sulfate with use to help with wound healing.  Wound care nurse was consulted to help manage deep pressure injuries right heel and sacrum.  Foot wounds were treated initially Xeroform with dry dressing change and now foam dressings being used for padding.  Prevalon boot also ordered for offloading of both feet.   Sacral wound being cleansed with saline and silicone foam dressing for padding.    His diabetes has been monitored with ac/hs CBG checks and SSI was use prn for tighter BS control.  Po intake is improving and Tradjenta  was added with improvement in BS. Dr. Cheryll Corti (neuropsychologist) was consulted to help with evaluation of mood and has worked with patient on coping and adjustment issues as well as concerns and emotional response regarding recent lifestyle changes. He  has been cleared to discontinue C collar. Wheelchair evaluation has been done for use in the future after d/c from SNF. Pain is controlled with use of tylenol  prn. Diet was initially downgraded to D2 but has been advanced  to regular as GI symptoms improved. He has been making gains with therapy but continues to be limited by quadriplegia with pain and spasticity.  He requires extensive assistance and family has elected on SNF for further therapies. [facility] was [facility] on 11[facility]24/23   Rehab course: During patient's stay in rehab weekly team conferences were held to monitor patient's progress, set goals and discuss barriers to discharge. At admission, patient required total assist with basic self care tasks and with mobility.  He  has had improvement in activity tolerance, balance, postural control as well as ability to compensate for deficits. He requires min to mod assist with upper body care and total assist with LB care.He requires mod to max assist for transfers and supervision for power wheelchair mobility.     Wound care: Cleanse areas with normal saline. Pat dry and apply foam dressing. Change  every 2-3 days. Continue prevalon boots to bilateral heels. Boost every 20 minutes when in chair and encourage side laying when in bed.      Diet: Carb Modified.   Disposition: Skilled Holiday representative.   Special Instructions: 1.Boost every 20 minutes when in chair. Prevalon boots bilaterally when in bed. 2. Foley care bid. Follow up with urology in a few weeks.Recommend  changing foley every  months and use 18 french coude cath 3. Eliquis to continue thorough 01/19/24 to complete his DVT prophylaxis course.   4. Monitor blood sugars ac/hs and use SSI per protocol.     Discharge Instructions     Ambulatory referral to Physical Medicine Rehab   Complete by: As directed       Allergies as of 11/24/2023       Reactions   Baclofen  Other (See Comments)   Severe delirium on Baclofen  and Flexeril    Flexeril  [cyclobenzaprine ] Other (See Comments)   Confusion/delirium   Asa [aspirin ] Other (See Comments)   GI bleeding   Gadolinium Derivatives Itching, Swelling, Other (See Comments)   Pt complained of face flushing/hottness and throat tightness/scratchiness immediately after the injections.  Within 4 minutes, all symptoms were gone and the study was completed.  No further complications or signs of allergy were exhibited after completion of study.    Iodinated Contrast Media Other (See Comments)   Pt does not recall reaction   Pneumococcal 13-val Conj Vacc    Other Reaction(s): achiness all over, dizziness, nausea, weakness   Scopolamine     Other Reaction(s): Delerium   Oxycodone -acetaminophen  Other (See Comments)   Dizziness and feeling of being uncomfortable         Medication List     STOP taking these medications    cyclobenzaprine  5 MG tablet Commonly known as: FLEXERIL    HYDROcodone -acetaminophen  5-325 MG tablet Commonly known as: NORCO/VICODIN   Jardiance 10 MG Tabs tablet Generic drug: empagliflozin   methylPREDNISolone  4 MG Tbpk tablet Commonly known as:  MEDROL  DOSEPAK   Myrbetriq  50 MG Tb24 tablet Generic drug: mirabegron  ER       TAKE these medications    acetaminophen  325 MG tablet Commonly known as: TYLENOL  Take 1-2 tablets (325-650 mg total) by mouth every 4 (four) hours as needed for mild pain (pain score 1-3). What changed:  how much to take reasons to take this   apixaban 2.5 MG Tabs tablet Commonly known as: ELIQUIS Take 1 tablet (2.5 mg total) by mouth 2 (two) times daily. Continue thorough 01/19/24 to complete DVT prophylaxis course   ascorbic acid  500 MG tablet Commonly known as: VITAMIN  C Take 1 tablet (500 mg total) by mouth at bedtime.   bisacodyl  10 MG suppository Commonly known as: DULCOLAX Place 1 suppository (10 mg total) rectally daily.   dantrolene  100 MG capsule Commonly known as: DANTRIUM  Take 1 capsule (100 mg total) by mouth 2 (two) times daily.   diclofenac  Sodium 1 % Gel Commonly known as: VOLTAREN  Apply 2 g topically 4 (four) times daily.   docusate sodium  100 MG capsule Commonly known as: COLACE Take 2 capsules (200 mg total) by mouth daily.   DULoxetine  20 MG capsule Commonly known as: CYMBALTA  Take 1 capsule (20 mg total) by mouth daily.   feeding supplement Liqd Take 237 mLs by mouth daily.   (feeding supplement) PROSource Plus liquid Take 30 mLs by mouth daily.   ferrous sulfate  325 (65 FE) MG tablet Take 1 tablet (325 mg total) by mouth at bedtime. What changed: when to take this   fluticasone  50 MCG/ACT nasal spray Commonly known as: FLONASE  Place 2 sprays into both nostrils daily as needed for allergies or rhinitis.   Gerhardt's butt cream Crea Apply 1 Application topically 3 (three) times daily.   linagliptin  5 MG Tabs tablet Commonly known as: TRADJENTA  Take 1 tablet (5 mg total) by mouth daily.   melatonin 5 MG Tabs Take 1 tablet (5 mg total) by mouth at bedtime as needed.   menthol -cetylpyridinium 3 MG lozenge Commonly known as: CEPACOL Take 1 lozenge (3 mg  total) by mouth as needed for sore throat.   metoprolol  succinate 25 MG 24 hr tablet Commonly known as: TOPROL -XL Take 0.5 tablets (12.5 mg total) by mouth daily. What changed:  medication strength how much to take additional instructions   midodrine  2.5 MG tablet Commonly known as: PROAMATINE  Take 3 tablets (7.5 mg total) by mouth 3 (three) times daily with meals.   multivitamin with minerals Tabs tablet Take 1 tablet by mouth daily.   Muscle Rub 10-15 % Crea Apply 1 Application topically as needed for muscle pain. To legs and arms   pantoprazole  40 MG tablet Commonly known as: PROTONIX  Take 1 tablet (40 mg total) by mouth 2 (two) times daily.   pregabalin  50 MG capsule Commonly known as: LYRICA  Take 1 capsule (50 mg total) by mouth 2 (two) times daily.   rosuvastatin  20 MG tablet Commonly known as: CRESTOR  Take 20 mg by mouth every evening.   senna-docusate 8.6-50 MG tablet Commonly known as: Senokot-S Take 2 tablets by mouth daily with breakfast.   simethicone  80 MG chewable tablet Commonly known as: MYLICON Chew 1 tablet (80 mg total) by mouth 4 (four) times daily as needed for flatulence (gas).   tamsulosin  0.4 MG Caps capsule Commonly known as: FLOMAX  Take 1 capsule (0.4 mg total) by mouth daily after supper.         Contact information for follow-up providers     Avva, Ravisankar, MD Follow up.   Specialty: Internal Medicine Why: Call in 1-2 days for post hospital follow up Contact information: [address] [place] [address] [phone]         Celia Coles, MD Follow up.   Specialty: Physical Medicine and Rehabilitation Why: office will call you with follow up appointment Contact information: [address] [place] [address] [phone]         Elna Haggis, MD Follow up.   Specialty: Neurosurgery Why: Call in 1-2 days for post hospital follow up Contact information: [address]  [place] [address] [phone]              Contact information for after-discharge care     Destination     [facility] .   Service: Skilled Nursing Contact information: [address] [place]  [address] [phone]                     Signed: Zelda Hickman 11/24/2023, 10:06 AM

## 2023-11-23 NOTE — [place] FL2 (Signed)
 [place]  MEDICAID FL2 LEVEL OF CARE FORM     IDENTIFICATION  Patient Name: Christian Bailey Birthdate: 19-Jun-1941 Sex: male Admission Date (Current Location): 10/18/2023  [facility] and [place] Number:  Producer, television/film/video and Address:  [facility] [place]. [facility], [address], [place], [place] [address]      Provider Number: [id number]  Attending Physician Name and Address:  Celia Coles, MD  Relative Name and Phone Number:  Prince Couey wife [phone]    Current Level of Care: Hospital Recommended Level of Care: Skilled Nursing Facility Prior Approval Number:    Date Approved/Denied:   PASRR Number: [id number]  Discharge Plan: SNF    Current Diagnoses: Patient Active Problem List   Diagnosis Date Noted   Coping style affecting medical condition 11/11/2023   Protein-calorie malnutrition, severe 11/01/2023   Adjustment disorder with mixed anxiety and depressed mood 10/28/2023   Depression with anxiety 10/25/2023   Urinary retention 10/22/2023   Neurogenic bladder 10/22/2023   Transient iatrogenic urethral bleeding 10/22/2023   Acute incomplete quadriplegia (HCC) 10/19/2023   Spinal cord compression (HCC) 10/18/2023   Unsteady gait 10/18/2023   Neck pain 10/18/2023   Cervical myelopathy (HCC) 10/18/2023   Delirium due to another medical condition, acute, hyperactive 10/09/2023   Intractable back pain 10/03/2023   Fall 10/02/2023   Acute renal failure superimposed on stage 3b chronic kidney disease, unspecified acute renal failure type (HCC) 05/07/2023   Leukocytosis 05/07/2023   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) 03/25/2020   Exudative age-related macular degeneration of right eye with inactive choroidal neovascularization (HCC) 03/25/2020   Advanced nonexudative age-related macular degeneration of right eye with subfoveal involvement 03/25/2020   Advanced nonexudative age-related  macular degeneration of left eye with subfoveal involvement 03/25/2020   Bacteremia due to Gram-negative bacteria 01/19/2019   CKD (chronic kidney disease) stage 3, GFR 30-59 ml/min (HCC) 01/19/2019   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) 07/06/2018   S/P shoulder replacement, right 12/07/2016   PVD (peripheral vascular disease) (HCC) 09/18/2016   Sinus tachycardia 05/15/2014   Carotid artery disease (HCC) 04/26/2014   PAD (peripheral artery disease) (HCC) 06/12/2013   RBBB (right bundle branch block with left anterior fascicular block) 06/12/2013   Claudication (HCC) 12/14/2012   Essential hypertension 12/14/2012   Hyperlipidemia 12/14/2012   Type 2 diabetes mellitus (HCC) 12/14/2012   History of colonic polyps 01/25/2012   DM 08/11/2010   Acute duodenal ulcer with hemorrhage 08/11/2010    Orientation RESPIRATION BLADDER Height & Weight     Self, Time, Situation, Place  Normal Indwelling catheter Weight: 133 lb 9.6 oz (60.6 kg) Height:  5\' 4"  (162.6 cm)  BEHAVIORAL SYMPTOMS/MOOD NEUROLOGICAL BOWEL NUTRITION STATUS      Continent Diet (regular thin liquids)  AMBULATORY STATUS COMMUNICATION OF NEEDS Skin   Extensive Assist Verbally PU Stage and Appropriate Care PU Stage 1 Dressing: Daily                     Personal Care Assistance Level of Assistance  Bathing, Dressing Bathing Assistance: Limited assistance   Dressing Assistance: Limited assistance     Functional Limitations Info  Sight Sight Info: Impaired (Glasses)        SPECIAL CARE FACTORS FREQUENCY  PT (By licensed PT), OT (By licensed OT), Bowel and bladder program     PT Frequency: 5x week OT Frequency: 5x week Bowel and Bladder Program Frequency: Bowel program  may need bladder program if take out foley          Contractures Contractures Info: Not present    Additional Factors Info  Code Status, Allergies Code Status Info: DNR Allergies Info: Aspirian, Gadolinium,  Iodinated contrast, Pneunoccal caccine, Scopolamine , Oxycodone            Current Medications (11/23/2023):  This is the current hospital active medication list Current Facility-Administered Medications  Medication Dose Route Frequency Provider Last Rate Last Admin   (feeding supplement) PROSource Plus liquid 30 mL  30 mL Oral Daily Lovorn, Megan, MD   30 mL at 11/23/23 0755   acetaminophen  (TYLENOL ) tablet 325-650 mg  325-650 mg Oral Q4H PRN Love, Pamela S, PA-C   650 mg at 11/22/23 2346   alum & mag hydroxide-simeth (MAALOX/MYLANTA) 200-200-20 MG/5ML suspension 30 mL  30 mL Oral Q4H PRN Love, Pamela S, PA-C       ascorbic acid  (VITAMIN C ) tablet 500 mg  500 mg Oral QHS Love, Pamela S, PA-C   500 mg at 11/22/23 2203   bisacodyl  (DULCOLAX) suppository 10 mg  10 mg Rectal Daily Lovorn, Megan, MD   10 mg at 11/22/23 1830   Chlorhexidine  Gluconate Cloth 2 % PADS 6 each  6 each Topical Q12H Lovorn, Megan, MD   6 each at 11/23/23 8295   dantrolene  (DANTRIUM ) capsule 50 mg  50 mg Oral TID Lovorn, Megan, MD   50 mg at 11/23/23 0755   diclofenac  Sodium (VOLTAREN ) 1 % topical gel 2 g  2 g Topical QID Love, Pamela S, PA-C   2 g at 11/23/23 6213   diphenhydrAMINE  (BENADRYL ) capsule 25 mg  25 mg Oral Q6H PRN Love, Pamela S, PA-C   25 mg at 10/28/23 2023   docusate sodium  (COLACE) capsule 200 mg  200 mg Oral Daily Street, [place], PA-C   200 mg at 11/23/23 0755   DULoxetine  (CYMBALTA ) DR capsule 20 mg  20 mg Oral Daily Love, Pamela S, PA-C   20 mg at 11/23/23 0755   enoxaparin  (LOVENOX ) injection 30 mg  30 mg Subcutaneous Q24H Amend, Caron G, RPH   30 mg at 11/22/23 1244   feeding supplement (ENSURE ENLIVE / ENSURE PLUS) liquid 237 mL  237 mL Oral Q24H Lovorn, Megan, MD       ferrous sulfate  tablet 325 mg  325 mg Oral QHS Love, Pamela S, PA-C   325 mg at 11/22/23 2203   fluticasone  (FLONASE ) 50 MCG/ACT nasal spray 2 spray  2 spray Each Nare Daily PRN Lovorn, Megan, MD       Gerhardt's butt cream   Topical  TID Lovorn, Megan, MD   Given at 11/23/23 0755   guaiFENesin -dextromethorphan  (ROBITUSSIN DM) 100-10 MG/5ML syrup 5-10 mL  5-10 mL Oral Q6H PRN Love, Pamela S, PA-C       insulin  aspart (novoLOG ) injection 0-5 Units  0-5 Units Subcutaneous QHS Love, Pamela S, PA-C   3 Units at 11/12/23 2125   insulin  aspart (novoLOG ) injection 0-9 Units  0-9 Units Subcutaneous TID WC Love, Pamela S, PA-C   1 Units at 11/22/23 1830   lidocaine  (XYLOCAINE ) 2 % jelly   Topical PRN Love, Pamela S, PA-C   1 Application at 10/21/23 1809   linagliptin  (TRADJENTA ) tablet 5 mg  5 mg Oral Daily Lovorn, Megan, MD   5 mg at 11/23/23 0755   melatonin tablet 5 mg  5 mg Oral QHS PRN Love, Pamela S, PA-C   5 mg at 11/03/23  2101   menthol -cetylpyridinium (CEPACOL) lozenge 3 mg  1 lozenge Oral PRN Love, Pamela S, PA-C       Or   phenol (CHLORASEPTIC) mouth spray 1 spray  1 spray Mouth/Throat PRN Love, Pamela S, PA-C       metoprolol  succinate (TOPROL -XL) 24 hr tablet 12.5 mg  12.5 mg Oral Daily Lovorn, Megan, MD   12.5 mg at 11/23/23 1610   midodrine  (PROAMATINE ) tablet 7.5 mg  7.5 mg Oral TID WC Lovorn, Megan, MD   7.5 mg at 11/23/23 9604   multivitamin with minerals tablet 1 tablet  1 tablet Oral Daily Lovorn, Megan, MD   1 tablet at 11/23/23 0755   Muscle Rub CREA   Topical PRN Street, [place], PA-C   Given at 11/21/23 2014   pantoprazole  (PROTONIX ) EC tablet 40 mg  40 mg Oral BID Love, Pamela S, PA-C   40 mg at 11/23/23 0755   pregabalin  (LYRICA ) capsule 50 mg  50 mg Oral BID Love, Pamela S, PA-C   50 mg at 11/23/23 0755   prochlorperazine  (COMPAZINE ) tablet 5-10 mg  5-10 mg Oral Q6H PRN Love, Pamela S, PA-C   10 mg at 11/05/23 1211   Or   prochlorperazine  (COMPAZINE ) suppository 12.5 mg  12.5 mg Rectal Q6H PRN Love, Pamela S, PA-C       Or   prochlorperazine  (COMPAZINE ) injection 5-10 mg  5-10 mg Intravenous Q6H PRN Love, Pamela S, PA-C       rosuvastatin  (CRESTOR ) tablet 20 mg  20 mg Oral QPM Love, Pamela S, PA-C   20 mg  at 11/22/23 1830   senna-docusate (Senokot-S) tablet 2 tablet  2 tablet Oral Q breakfast Zelda Hickman, PA-C   2 tablet at 11/23/23 0755   simethicone  (MYLICON) chewable tablet 80 mg  80 mg Oral QID PRN Lovorn, Megan, MD       sodium phosphate  (FLEET) enema 1 enema  1 enema Rectal Once PRN Love, Pamela S, PA-C       tamsulosin  (FLOMAX ) capsule 0.4 mg  0.4 mg Oral QPC supper Lovorn, Megan, MD   0.4 mg at 11/22/23 1830   traMADol  (ULTRAM ) tablet 50 mg  50 mg Oral Q8H PRN Lovorn, Megan, MD         Discharge Medications: Please see discharge summary for a list of discharge medications.  Relevant Imaging Results:  Relevant Lab Results:   Additional Information [id number] Needs short term rehab to get more function  [place], Maralee Senate, LCSW

## 2023-11-23 NOTE — Progress Notes (Signed)
 Occupational Therapy Session Note  Patient Details  Name: Christian Bailey MRN: [id number] Date of Birth: [birth year]  Today's Date: 11/23/2023 OT Individual Time: 1430-1530 OT Individual Time Calculation (min): 60 min    Short Term Goals: Week 5:  OT Short Term Goal 1 (Week 5): Pt will complete bathing at Mod A with AE as necessary OT Short Term Goal 2 (Week 5): Pt will complete bed mobility at Mod A in order to prepare for ADLs OT Short Term Goal 3 (Week 5): Pt will complete sit to stand with Mod A consistently throughout sessions within stedy  Skilled Therapeutic Interventions/Progress Updates:      Therapy Documentation Precautions:  Precautions Precautions: Fall, Cervical Precaution/Restrictions Comments: decreased recall of precautions, not able to verbalize any when prompted Required Braces or Orthoses: Cervical Brace Cervical Brace: Soft collar Restrictions Weight Bearing Restrictions Per Provider Order: No General: "I will miss you guys!" Pt supine in bed upon OT arrival, agreeable to OT session.  Pain:  3/10 pain reported in Rt heel, positioning, intermittent rest breaks, distractions provided for pain management, pt reports tolerable to proceed.   Exercises: Pt participated in [facility] activity with pink putty and beads, pt retrieving beads  with BUE for increased dexterity and coordination in order to complete ADLs such as buttoning shirts. Pt completed tasks with PRN rest breaks.  Other Treatments: OT emphasized importance of pt advocating for himself at new facility regarding pressure relief schedule in bed and W/C and use of PRAFO boots in bed at new facility to prevent worsening of skin breakdown. OT re-iterating use of handout given in prior session for BLE stretching for tone/spasticity management at SNF. OT testing ROM/MMT for D/C planning and custom W/C eval, numbers as follows: R grip- 39 lb Lt grip- 25 lb R pinch- 12 lb Lt pinch- 11 lb Scores not within normal limits  of age/sex and showing dominance of Rt side vs Lt.  Pt supine in bed with bed alarm activated, 2 bed rails up, call light within reach and 4Ps assessed.   Therapy/Group: Individual Therapy  Nila Barth, OTD, OTR/L 11/23/2023, 4:07 PM

## 2023-11-23 NOTE — Progress Notes (Signed)
 Inpatient Rehabilitation Discharge Medication Review by a Pharmacist  A complete drug regimen review was completed for this patient to identify any potential clinically significant medication issues.  High Risk Drug Classes Is patient taking? Indication by Medication  Antipsychotic No   Anticoagulant Yes Apixaban  - VTE ppx  Antibiotic No   Opioid No   Antiplatelet No   Hypoglycemics/insulin  Yes Linagliptin  - DM   Vasoactive Medication Yes Metoprolol  - Afib, HR Midodrine  - BP Tamsulosin  - BPH  Chemotherapy No   Other Yes Duloxetine  - Mood Pantoprazole  - Reflux  Pregabalin  - Pain Rosuvastatin  - HLD Dantrolene  - muscle spasms     Type of Medication Issue Identified Description of Issue Recommendation(s)  Drug Interaction(s) (clinically significant)     Duplicate Therapy     Allergy     No Medication Administration End Date     Incorrect Dose     Additional Drug Therapy Needed     Significant med changes from prior encounter (inform family/care partners about these prior to discharge).    Other       Clinically significant medication issues were identified that warrant physician communication and completion of prescribed/recommended actions by midnight of the next day:  No  Name of provider notified for urgent issues identified:   Provider Method of Notification:     Pharmacist comments:   Time spent performing this drug regimen review (minutes):  None  Thank you. Lennice Quivers, PharmD

## 2023-11-23 NOTE — Progress Notes (Signed)
 Physical Therapy Session Note  Patient Details  Name: Christian Bailey MRN: [id number] Date of Birth: [birth year]  Today's Date: 11/23/2023 PT Individual Time: 0800-0912 PT Individual Time Calculation (min): 72 min   Short Term Goals: Week 4:  PT Short Term Goal 1 (Week 4): Pt will be able to progress lateral leans for slideboard placement with max A PT Short Term Goal 1 - Progress (Week 4): Met PT Short Term Goal 2 (Week 4): Pt will be able to perform rolling with bed rail features with with mod assist PT Short Term Goal 2 - Progress (Week 4): Met PT Short Term Goal 3 (Week 4): Pt will be able to tolerate WB in standing x 10 min for tone management and NMR PT Short Term Goal 3 - Progress (Week 4): Met  Skilled Therapeutic Interventions/Progress Updates:    pt received in bed and agreeable to therapy. Pt reports pain generally controlled, but spasticity related pain with weight bearing DF and with hamstring stretching.  Donned ted hose and pants with assist for time.   Bed mobility with max a for LE management and trunk elevation. Max a slideboard transfer with step by step cueing for anterior weight shift and hand placement, but unclear if difficulty with anterior lean was strength or tone related.   Pt navigates PWC with supervision throughout session. Cues for speed adjustment and assist in tight spaces.   Session focused on standing in //bars for spasticity management and LE strength/neuromuscular control. Mod-max Sit to stand from elevated chair. Pt able to maintain x 1.5 min, x 2 min, and x 2.5 min. Performed weight shifting for dynamic balance and pre-gait. Cues for upright posture and continued hip/knee extension. Knee block throughout but able to maintain with CGA knee block for 5-10 second bouts. Pt noted calf stretch in standing throughout, note pt was in prevalon boots not PRAFOs on arrival.   Therapist provided LE stretching for spasticity management, R>L. Rythmic rotation utilized  to decrease tone for more comfortable stretch. Pt remained in PWC at end of session and was left with all needs in reach and alarm active.   Therapy Documentation Precautions:  Precautions Precautions: Fall, Cervical Precaution/Restrictions Comments: decreased recall of precautions, not able to verbalize any when prompted Required Braces or Orthoses: Cervical Brace Cervical Brace: Soft collar Restrictions Weight Bearing Restrictions Per Provider Order: No General:       Therapy/Group: Individual Therapy  [name] 11/23/2023, 12:18 PM

## 2023-11-23 NOTE — Progress Notes (Addendum)
 Patient ID: Christian Bailey, male   DOB: [birth year], 83 y.o.   MRN: [id number] Have reached [place] and have emailed her pt's [facility] if not able to take have expanded the search for a SNF bed.  12;14 PM Bed offered via [facility] and wife and pt has accepted. MD feels ready tomorrow. Will get paperwork together and schedule ambulance for tomorrow am. [facility] wants at 1:00 pm will set up transport. [facility] set up for 1:00 pm transport. Aware pt has made progress this week in therapies and wife's plan is to get husband back home.

## 2023-11-23 NOTE — Patient Care Conference (Cosign Needed Addendum)
 Inpatient RehabilitationTeam Conference and Plan of Care Update Date: 11/23/2023   Time: 1139 am    Patient Name: Christian Bailey      Medical Record Number: [id number]  Date of Birth: December 08, 1940 Sex: Male         Room/Bed: 4M03C/4M03C-01 Payor Info: Payor: MEDICARE / Plan: MEDICARE PART A AND B / Product Type: *No Product type* /    Admit Date/Time:  10/18/2023  3:33 PM  Primary Diagnosis:  Acute incomplete quadriplegia [facility])  Hospital Problems: Principal Problem:   Acute incomplete quadriplegia (HCC) Active Problems:   Cervical myelopathy (HCC)   Urinary retention   Neurogenic bladder   Transient iatrogenic urethral bleeding   Depression with anxiety   Adjustment disorder with mixed anxiety and depressed mood   Protein-calorie malnutrition, severe   Coping style affecting medical condition   Pressure injury of deep tissue of foot   Pressure injury of deep tissue of buttock    Expected Discharge Date: Expected Discharge Date: 11/24/23  Team Members Present: Physician leading conference: Dr. Celia Coles Social Worker Present: Adrianna Albee, LCSW Nurse Present: Jerene Monks, RN PT Present: Aundria Leech, PT OT Present: Nila Barth, OT PPS Coordinator present : Jestine Moron, SLP     Current Status/Progress Goal Weekly Team Focus  Bowel/Bladder   Urinary catheter in place/ Patient on bowel program, LBM: 11/22/23   Regain continence of bowel and bladder.   Assess bowel and bladder needs q shift and PRN, offer toileting q2 hours while awake.    Swallow/Nutrition/ Hydration               ADL's   total A LB ADLs, Mod A UB ADLs, set up for grooming, Mod A to stand from raised surface yesterday (5/26),   Mod A   transfer training, UB/LB strength/endurance    Mobility   mod to max A for bed mobility, mod to max A slideboard transfers, limited gait this week (+2), supervision PWC   downgraded to mod A w/c level overall for transfers and bed mobility; S PWC mobility,  and mod A sit <> stand  transfers, tone management, functional strengthening in all extremeities and trunk control, balance retraining    Communication                Safety/Cognition/ Behavioral Observations               Pain   Patient denies pain.   Pain < 3/10.   Assess pain q shift and prn medicate as directed.    Skin   Redness to buttock, DTI to right heel, RLE abrasion.   Patient to remain free of infection or skin breakdown  Assess skin q shift and PRN.      Discharge Planning:  have expanded NH search and barrier has been looks like long term care pt but is not. Having to call facilities to update    Team Discussion: Patient was admitted post C5-7 ACDF secondary to Traumatic incomplete quadriplegia.Patient limited by pain, spasticity, weakness of bilateral shoulder and poor balance of bilateral lower extremities.   Patient on target to meet rehab goals: no, currently patient requires mod assistance with UB ADLs and total assistance with LB ADLs. Patient needs mod-max assistance using a slide board for transfers. Patient  requires mod assist with sit to stand, supervision assistance using a power wheelchair. Overall goals at discharge are set for mod assistance.   *See Care Plan and progress notes for long and short-term goals.  Revisions to Treatment Plan:  Prevalon boots Wedge for turning  Turn q 2 hours Wound plan initiated  Foam dressings on both heels Family Conference Downgraded goals Slide board Stedy Foley catheter Power wheelchair Wheelchair evaluation  Teaching Needs: Safety, medications, transfers, toileting, foley care, bowel program, etc    Current Barriers to Discharge: Decreased caregiver support, Home enviroment access/layout, Neurogenic bowel and bladder, and Wound care  Possible Resolutions to Barriers: Family Education SNF     Medical Summary Current Status: has bowel program- sometimes incontinent of stool- has foley- pt  doesn't want  Barriers to Discharge: Behavior/Mood;Hypotension;Incontinence;Neurogenic Bowel & Bladder;Spasticity;Self-care education;Weight bearing restrictions;Complicated Wound  Barriers to Discharge Comments: limited by wounds; spasticity sedation; poor mood Possible Resolutions to [employer] Focus: slow progress- core is stronger- still needs power w/c- increasing dantrolene - d/c- SNF   Continued Need for Acute Rehabilitation Level of Care: The patient requires daily medical management by a physician with specialized training in physical medicine and rehabilitation for the following reasons: Direction of a multidisciplinary physical rehabilitation program to maximize functional independence : Yes Medical management of patient stability for increased activity during participation in an intensive rehabilitation regime.: Yes Analysis of laboratory values and/or radiology reports with any subsequent need for medication adjustment and/or medical intervention. : Yes   I attest that I was present, lead the team conference, and concur with the assessment and plan of the team.   Ella Guillotte Gayo 11/23/2023, 1139 am

## 2023-11-23 NOTE — Progress Notes (Signed)
 PROGRESS NOTE   Subjective/Complaints:  Pt reports  R heel bothered him last night- but heel pain better this AM Begging to get out of soft collar. Per therapy, needs a 2nd collar- ordered ROS:   Pt denies SOB, abd pain, CP, N/V/C/D, and vision changes  + Heel discomfort- on R side- still evident and buttock/coccyx pain- slightly better + nausea/vomiting- -hasn't recurred   Objective:   No results found.    Recent Labs    11/22/23 0619 11/23/23 0454  WBC 11.5* 9.9  HGB 9.0* 8.7*  HCT 28.3* 27.5*  PLT 424* 394        Recent Labs    11/22/23 0619  NA 139  K 3.9  CL 105  CO2 26  GLUCOSE 129*  BUN 33*  CREATININE 1.54*  CALCIUM  8.5*         Intake/Output Summary (Last 24 hours) at 11/23/2023 0851 Last data filed at 11/23/2023 0725 Gross per 24 hour  Intake 556 ml  Output 1000 ml  Net -444 ml     Pressure Injury 10/28/23 Sacrum Medial Stage 2 -  Partial thickness loss of dermis presenting as a shallow open injury with a red, pink wound bed without slough. STAGE 1 ON SACRUM (Active)  10/28/23 1333  Location: Sacrum  Location Orientation: Medial  Staging: Stage 2 -  Partial thickness loss of dermis presenting as a shallow open injury with a red, pink wound bed without slough.  Wound Description (Comments): STAGE 1 ON SACRUM  Present on Admission: No     Pressure Injury 10/28/23 Heel Right Deep Tissue Pressure Injury - Purple or maroon localized area of discolored intact skin or blood-filled blister due to damage of underlying soft tissue from pressure and/or shear. DTI on bottom of heel (Active)  10/28/23 1820  Location: Heel  Location Orientation: Right  Staging: Deep Tissue Pressure Injury - Purple or maroon localized area of discolored intact skin or blood-filled blister due to damage of underlying soft tissue from pressure and/or shear.  Wound Description (Comments): DTI on bottom of heel   Present on Admission: No    Physical Exam: Vital Signs Blood pressure (!) 104/58, pulse 92, temperature 97.7 F (36.5 C), temperature source Oral, resp. rate 18, height 5\' 4"  (1.626 m), weight 60.6 kg, SpO2 98%.      General: awake, alert, appropriate, woke pt up- not wearing collar;  NAD HENT: conjugate gaze; oropharynx moist CV: regular rate and rhythm; no JVD Pulmonary: CTA B/L; no W/R/R- good air movement GI: soft, NT, ND, (+)BS Psychiatric: appropriate Neurological: Ox3  Less spasticity in RLE this AM- MAS of 1+  GU- light yellow amber urine in foley bag Wearing prevalons B/L and propped slightly to left side with wedge Skin: R heel the tissue is a little concave over DTI but not open- maroon colored.  PRIOR EXAMS: MAS of 1+ to 2 in RLE- no clonus  Skin: Heels covered with foam dressings B/L- R heel DTI and Sacral Stage II     Prior exam MSK: Ue's 4/5 B/L throughout LE's- 2-/5 B/L-in HF/KE- 4-/5 DF/PF but RLE weaker than LLE Spasticity- MAS of 1+ to 2 in Ue's this  Am and 3 in RLE- MAS worse this AM- 2-3 throughout Neurologic: Cranial nerves II through XII intact, motor strength is 4/5 in bilateral  bicep, tricep, grip,and finger ext and hand intrinsics, 3- RIght delt and 4/5 left delt, 3- bilateral  hip flexor, 3- RIght and 4- left knee extensors, 4/5 bilateral ankle dorsiflexor and plantar flexor Sensory exam normal sensation to light touch and proprioception in bilateral upper and lower extremities Tone- 3+ a bilateral biceps triceps and BR, + Hoffman's bilateral 2/5 RIght 3+ left knee Also MAS 3 tone in RIght Knee flexors and extensors, MAS 1 at the left knee Clonus at bilateral ankles  Musculoskeletal: Full range of motion in all 4 extremities. No joint swelling      Assessment/Plan: 1. Functional deficits which require 3+ hours per day of interdisciplinary therapy in a comprehensive inpatient rehab setting. Physiatrist is providing close team supervision  and 24 hour management of active medical problems listed below. Physiatrist and rehab team continue to assess barriers to discharge/monitor patient progress toward functional and medical goals  Care Tool:  Bathing              Bathing assist Assist Level: Maximal Assistance - Patient 24 - 49%     Upper Body Dressing/Undressing Upper body dressing   What is the patient wearing?: Pull over shirt    Upper body assist Assist Level: Minimal Assistance - Patient > 75%    Lower Body Dressing/Undressing Lower body dressing      What is the patient wearing?: Underwear/pull up, Pants     Lower body assist Assist for lower body dressing: Dependent - Patient 0%     Toileting Toileting    Toileting assist Assist for toileting: Dependent - Patient 0%     Transfers Chair/bed transfer  Transfers assist     Chair/bed transfer assist level: Moderate Assistance - Patient 50 - 74%     Locomotion Ambulation   Ambulation assist   Ambulation activity did not occur: Safety/medical concerns  Assist level: 2 helpers Assistive device: [name] Max distance: 10'   Walk 10 feet activity   Assist  Walk 10 feet activity did not occur: Safety/medical concerns  Assist level: 2 helpers Assistive device: Walker-Eva   Walk 50 feet activity   Assist Walk 50 feet with 2 turns activity did not occur: Safety/medical concerns         Walk 150 feet activity   Assist Walk 150 feet activity did not occur: Safety/medical concerns         Walk 10 feet on uneven surface  activity   Assist Walk 10 feet on uneven surfaces activity did not occur: Safety/medical concerns         Wheelchair     Assist Is the patient using a wheelchair?: Yes Type of Wheelchair: Power    Wheelchair assist level: Supervision/Verbal cueing Max wheelchair distance: 300    Wheelchair 50 feet with 2 turns activity    Assist        Assist Level: Supervision/Verbal cueing    Wheelchair 150 feet activity     Assist      Assist Level: Supervision/Verbal cueing   Blood pressure (!) 104/58, pulse 92, temperature 97.7 F (36.5 C), temperature source Oral, resp. rate 18, height 5\' 4"  (1.626 m), weight 60.6 kg, SpO2 98%.  Medical Problem List and Plan: 1. 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  Antithrombotics: -DVT/anticoagulation:  Mechanical: Sequential compression devices, below knee Bilateral lower extremities 4/23- will see if Dr Ellery Guthrie will allow us  to start Lovenox - he said yes, so will start. 30mg  daily. 5/14- when leaves, will go on Eliquis  for a total of 3 months 2.5 mg BID             -antiplatelet therapy: N/A 3. Pain Management: tylenol  prn. Voltaren  QID, lyrica  50mg  BID, flexeril  PRN 5/8- pain better since started baclofen - didn't need tramadol  5/9- will add Dantrolene  50 mg due to Allergy with Baclofen  at bedtime and see if tolerates- if so, will con't to increase the dose to help spasticity and reduce chance of sedation/side effects  -11/07/23 very delirious yesterday per pt, will stop baclofen  and leave Dantrolene  for now; monitor 5/19- pain doing well as long as turned and gets prevalon boots 5/20- pt reports pain still on backside and R heel 5/21- pain meds working -w hen takes Tylenol   5/22- Tylenol  works for sacral and R heel pain 5/23- Stander was extremely effective for Spasticity pain in legs! 4. Mood/Behavior/Sleep: LCSW to follow up for evaluation and  support when appropriate --Delirium precautions--His mental status has cleared but will monitor for worsening with transfer to new unit .pt has not required prn meds for agitation in several days  Monitor sleep wake cycle             -antipsychotic agents: Change haldol  q 6 hrs prn to Seroquel  12.5mg  tid prn  -Restoril  7.5mg  nightly, cymbalta  20mg  daily 4/23-4/25 no delirium so far 4/29- still confused this AM-  5/9 Aox3 this AM -11/07/23 delirious yesterday, stopped baclofen  as above. 5/14- resolved  5. Neuropsych/cognition: This patient is usually  capable of making decisions on his own behalf. 6. Skin/Wound Care: Routine pressure relief measures. 5/20- Has Stage II on backside- now has minor breakdown per nursing- and R heel DTI- has prevalons- pt to be turned q2 hours- couldn't tolerate air bed.   5/21- turning with new wedge we got him 5/22- pt reports with new wedge, less discomfort on sacrum- however R DTI still uncomfortable constantly- will d/w nursing about turning him maybe MORE to the side- so heels not down all the time?  5/23- better when leaves a "gap" on is helpful for heel pain- d/w nursing  5/26- heel pain doing better- somewhat better 7. Fluids/Electrolytes/Nutrition:  Monitor I/O. Routine labs. Continue vitamins/supplements. 8. 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 Willye Harvey, per note, leave in place for 7-10d, Consider resuming In-N-Out catheterization for management of his neurogenic bladder at that time. Recommend using a coud catheter for intermittent catheterization (when resumed).  5/7- pt and wife have decided they don't want to remove foley -  they  don't want ot go back to in/out caths-  since wasn't emptying before- they want a chronic foley- when offered to remove foley just for rehab, to see if he gets return, he is scared will get bleeding again, due to false passage already had and declined 5/12 Foley draining clear yellow urine 5/16- wife still wants and if going to SNF, will need to keep 5/20- pt wants to keep Foley- doesn't want to go back to in/out caths 10.  Diabetes- A1c 8.0-  with peripheral neuropathy by hx only on Jardiance at home, use SSI in the hospital  -10/23/23 CBGs variable but likely d/t steroids; cont SSI for now 4/29- CBG's very labile in last 24 hours 99-383- - it concerns me to start Jardiance since of Foley and risk of UTI's- also for starting Tradjenta  since BG was 99 last night- don't want to drop BG too much 4/30- Will add Tradjenta  since BG's still running 150-242- will monitor closely for CBG hypoglycemia-   10/30/23 CBGs looking great!  -10/31/23 CBGs a little higher the last day, but monitor for trend  5/5- Cbgs all less than 200- con't to monitor  5/6- CBGs 130s- 180s- 5/7-5/9 CBG's 128 to 220- had snack yesterday- still snacking in afternoon -5/10-11/25 CBGs great except for afternoon d/t snacks. Leave meds alone for now -5/12 fair glucose control, continue current regimen and monitor. 5/13- 5/15- great control except for afternoon- snacking- will d/w pt 5/16- Cbgs jumping higher- if this continues to occur, will need other meds for CBG's -11/13/23 CBGs so variable but mostly it's the afternoon/evening values; will start carb modified diet as he's on regular diet right now. If not, might need to add other med? -11/14/23 CBGs much better overnight; asked that he limit his sugar (has regular cola next to his bed), hopefully diet alone will control his sugars better; pt aware and agreeable.  5/19-5/22 Cbgs looking much better- con't regimen 5/26-5/27 CBG's well controlled- con't regimen CBG (last 3)  Recent  Labs    11/22/23 1624 11/22/23 2106 11/23/23 0651  GLUCAP 149* 131* 109*     11.  Spasticity affecting LEs> UE and R>L , aggressive ROM  with PT, consider oral meds  4/22- starting baclofen  5 mg TID- with his Cr 1.50- has been running 1.68 to 2.30  4/24- will not increase baclofen  due to Renal issues 5/9- will add Dantrolene  50 mg at bedtime to reduce chance of spasticity but also reduce chance of side effects/sedation- - will monitor -11/07/23 stopped baclofen  d/t delirium, cont dantrolene  5/20- increased Dantrolene  to 50 mg TID- no side effects at this time 12.  PAD s/p SFA and Iliac stenting, continue metoprolol  50mg  daily  13. CKD3b? With AKI- AKI has resolved  4/28- Cr 1.63- doing better 4/29- Cr 1.59 after 12+ hours of IVF's- and BUN very slightly down to 70 from 72-   5/1- Cr down to 1.43 and BUN down to 53  5/5- Cr 1.51 and BUN down to 37- drinking more!  5/8- Cr 1.8- up from 1.51- but BUN 34 down from 37- will push fluids  5/12 creatinine and BUN much lower at 1.33/26-improved  5/15- Cr 1.68 and BUN 30 this AM- BUN improving- but Cr bounces  5/19- BUN 30 and Cr 1.64  5/22- BUN 33 and Cr 1.45  5/26- BUN 33 and Cr 1.54 14. Neurogenic bowel: continue miralax  32g daily, senokot s 2 tabs qAM  5/1- LBM this Am- was accident- might need bowel program 5/20- Bowel program  yielded results without dig stim last night- feeling good- con't regimen 15. HLD: crestor  20mg  nightly 16. Delirium/Confusion 4/30- pt Ox3 this AM- very with it- likely flexeril  based on chart review- given for pain since on no pain meds- will keep off Flexeril  and monitor Sx's  5/5- Sx's resolved  -11/07/23 SEE ABOVE 17. Hx of HTN with severe hypotension 5/6-5/11 BP doing better- con't regimen 5/12 orthostatic hypotension noted this morning, bolus IVF 500cc. 5/13- Will increase Midodrine  to 7.5 mg TID 5/14- 5/15- BP slightly better actually has 1 episode of 155 systolic so wait to increase Midodrine  5/16-5/20-  BP doing better- only one episode where BP 110's- con't to monitor 5/22- BP running low 100's systolic when laying down- will d/w therapy if having drops in BP still - if so will increase midodrine  5/23- stood in stander with no drops in BP -5/24 5/26-  BPs better, monitor Vitals:   11/19/23 1455 11/19/23 1935 11/20/23 0319 11/20/23 1328  BP: (!) 142/59 (!) 107/56 120/65 108/71   11/20/23 1912 11/21/23 0447 11/21/23 1452 11/21/23 1941  BP: (!) 124/55 (!) 128/55 (!) 115/51 132/79   11/22/23 0432 11/22/23 1512 11/22/23 1954 11/23/23 0507  BP: 106/73 130/61 (!) 118/53 (!) 104/58     18. Atelectasis 4/30- sounds coarse- asked pt to use ICS regularly- is at bedside- also described "spitting things up"_ had "spit cup"- at bedside-  had barely anything in it. 5/5- not spitting up anymore  19. R heel DTI and Stage I on sacrum 5/2- is new- ordered prevalon boots as well as low air loss mattress and needs to be turned q2 hours- placed order  5/8- pt not able to tolerate air mattress- turn q2 hours and prevalons for Heel DTI- d/w nursing- just removed this AM 5/12 patient asked for device to keep pressure off his heel, Prevalon boots at bedside. Will chek with nursing regarding use of these boots at night.  5/27- R heel maroon and a little concave- but not open- con't Prevalon when in bed 20. Spasticity  5/5- will d/w wife tomorrow about restarting Baclofen  vs Dantrolene .   5/6- attempted to call wife again x2- cannot get her 5/7- was able to see wife yesterday and discussed- we will retry Pt on Baclofen  5 mg TID for now- not a higher dose due to renal issues-  and monitor for confusion/delirium- if gets confused, will try Dantrolene .   5/8- no confusion and RLE much less pain- so think it's working 5/9- will add Dantrolene  as above 5/14- Increased dantrolene  to 50 mg BID 5/20- increase Dantrolene  to 50 mg TID- is helping some and no side effects 5/22- feels like spasticity is improving mildly-   21. Severe malnutrition  5/5- per Dietitian- will do supplements 22. Mild tachycardia 5/8- per pt, has ALWAYS had tachycardia- has been worked up- and nothing found- usually 90s-100's per pt- which is the same here- will monitor 23. Ileus??? 5/13- reached out to Radiology about pt's questionable ileus- not clear in Radiology report  5/14- doesn't have ileus- but full of gas- started Simethicone  QID  5/20- make Gas-X prn per pt request 24. Nasal congestion with mild cough 5/15- probably due to post nasal drip- will start Flonase  2 sprays each nare daily.   5/16- says nasal congestion much better- won't need Flonase - will make prn 25. Leukocytosis 5/19- no signs of infection, no fever, no confusion- will recheck in AM unless he feels ill today.   5/20- WBC down to 11k- will con't  to monitor- and recheck Thursday  5/22- WBC down to 8.8- no fever or other Sx's  5/26- WBC up to 11.5- will recheck in AM- no Sx's of URI/UTI  5/27- WBC down to 9.9 today-    I spent a total of  41  minutes on total care today- >50% coordination of care- due to  Called [facility]; spoke to OT about collar; also spoke with nursing- also did team conference to f/u on progress/function    LOS: 36 days A FACE TO FACE EVALUATION WAS PERFORMED  Angelie Kram 11/23/2023, 8:51 AM

## 2023-11-23 NOTE — Progress Notes (Signed)
 Inpatient Rehabilitation Care Coordinator Discharge Note   Patient Details  Name: Christian Bailey MRN: [id number] Date of Birth: 02-22-1941   Discharge location: [facility] MORE REHAB  Length of Stay: 37 DAYS  Discharge activity level: MOD WHEELCHAIR LEVEL  Home/community participation: ACTIVE  Patient response WU:[id number] Literacy - How often do you need to have someone help you when you read instructions, pamphlets, or other written material from your doctor or pharmacy?: Never  Patient response YN:[id number] Isolation - How often do you feel lonely or isolated from those around you?: Never  Services provided included: MD, RD, PT, OT, RN, CM, TR, Pharmacy, Neuropsych, SW  Financial Services:  Financial Services Utilized: Medicare    Choices offered to/list presented to: PT AND WIFE  Follow-up services arranged:  Other (Comment) (SNF)     LIFE STAR SET UP FOR 1:00 PM  NUMBER FOR REPORT GIVEN TO BEDSIDE RN [phone] ROOM 503b    Patient response to transportation need: Is the patient able to respond to transportation needs?: Yes In the past 12 months, has lack of transportation kept you from medical appointments or from getting medications?: No In the past 12 months, has lack of transportation kept you from meetings, work, or from getting things needed for daily living?: No   Patient/Family verbalized understanding of follow-up arrangements:  Yes  Individual responsible for coordination of the follow-up plan: Christian Bailey  [phone]  Confirmed correct DME delivered: Christian Bailey 11/23/2023    Comments (or additional information): WIFE IS NOT ABLE TO MANGE HIS CARE AT THIS TIME AND WANTS HIM TO GET MORE REHAB AND THEN GO HOME  Summary of Stay    Date/Time Discharge Planning CSW  11/23/23 4132 have expanded NH search and barrier has been looks like long term care pt but is not. Having to call facilities to update RGD  11/16/23 0856 Pursuing SNF  bed, wife vry involved and supportive but can not provide the care he needs at this time. RGD  11/10/23 1312 Wife came in  for therapies and decided hsband is too much care for her at this time. Want to pursue SNF[facility] at this time RGD  11/02/23 1019 Family conference held last week and plan is for home with wife being primary caregiver and son's supplementing and possibly hiring assist. RGD  10/27/23 4401 Wife was providing care prior to admission but pt coudl stand. Need a family conference to discuss plans and for family to come up with realistic plan. Pt more clear today RGD  10/19/23 1017 New evaluation home with wife who was assisting prior to admission has hurt her knee though and dealing with recent daughter in-law's death from cancer RGD       Christian Bailey, Christian Bailey

## 2023-11-24 DIAGNOSIS — R2689 Other abnormalities of gait and mobility: Secondary | ICD-10-CM | POA: Diagnosis not present

## 2023-11-24 DIAGNOSIS — N319 Neuromuscular dysfunction of bladder, unspecified: Secondary | ICD-10-CM | POA: Diagnosis not present

## 2023-11-24 DIAGNOSIS — Z20822 Contact with and (suspected) exposure to covid-19: Secondary | ICD-10-CM | POA: Diagnosis not present

## 2023-11-24 DIAGNOSIS — I739 Peripheral vascular disease, unspecified: Secondary | ICD-10-CM | POA: Diagnosis not present

## 2023-11-24 DIAGNOSIS — E119 Type 2 diabetes mellitus without complications: Secondary | ICD-10-CM | POA: Diagnosis not present

## 2023-11-24 DIAGNOSIS — L89152 Pressure ulcer of sacral region, stage 2: Secondary | ICD-10-CM | POA: Diagnosis not present

## 2023-11-24 DIAGNOSIS — K592 Neurogenic bowel, not elsewhere classified: Secondary | ICD-10-CM | POA: Diagnosis not present

## 2023-11-24 DIAGNOSIS — N401 Enlarged prostate with lower urinary tract symptoms: Secondary | ICD-10-CM | POA: Diagnosis not present

## 2023-11-24 DIAGNOSIS — I959 Hypotension, unspecified: Secondary | ICD-10-CM | POA: Diagnosis not present

## 2023-11-24 DIAGNOSIS — G8250 Quadriplegia, unspecified: Secondary | ICD-10-CM | POA: Diagnosis not present

## 2023-11-24 DIAGNOSIS — L89616 Pressure-induced deep tissue damage of right heel: Secondary | ICD-10-CM | POA: Diagnosis not present

## 2023-11-24 DIAGNOSIS — G8252 Quadriplegia, C1-C4 incomplete: Secondary | ICD-10-CM | POA: Diagnosis not present

## 2023-11-24 DIAGNOSIS — R2681 Unsteadiness on feet: Secondary | ICD-10-CM | POA: Diagnosis not present

## 2023-11-24 DIAGNOSIS — E1122 Type 2 diabetes mellitus with diabetic chronic kidney disease: Secondary | ICD-10-CM | POA: Diagnosis not present

## 2023-11-24 DIAGNOSIS — K219 Gastro-esophageal reflux disease without esophagitis: Secondary | ICD-10-CM | POA: Diagnosis not present

## 2023-11-24 DIAGNOSIS — R51 Headache with orthostatic component, not elsewhere classified: Secondary | ICD-10-CM | POA: Diagnosis not present

## 2023-11-24 DIAGNOSIS — Z66 Do not resuscitate: Secondary | ICD-10-CM | POA: Diagnosis not present

## 2023-11-24 DIAGNOSIS — N183 Chronic kidney disease, stage 3 unspecified: Secondary | ICD-10-CM | POA: Diagnosis not present

## 2023-11-24 DIAGNOSIS — M5000 Cervical disc disorder with myelopathy, unspecified cervical region: Secondary | ICD-10-CM | POA: Diagnosis not present

## 2023-11-24 DIAGNOSIS — R278 Other lack of coordination: Secondary | ICD-10-CM | POA: Diagnosis not present

## 2023-11-24 DIAGNOSIS — F54 Psychological and behavioral factors associated with disorders or diseases classified elsewhere: Secondary | ICD-10-CM | POA: Diagnosis not present

## 2023-11-24 DIAGNOSIS — R29898 Other symptoms and signs involving the musculoskeletal system: Secondary | ICD-10-CM | POA: Diagnosis not present

## 2023-11-24 DIAGNOSIS — G959 Disease of spinal cord, unspecified: Secondary | ICD-10-CM | POA: Diagnosis not present

## 2023-11-24 DIAGNOSIS — M4712 Other spondylosis with myelopathy, cervical region: Secondary | ICD-10-CM | POA: Diagnosis not present

## 2023-11-24 DIAGNOSIS — M6281 Muscle weakness (generalized): Secondary | ICD-10-CM | POA: Diagnosis not present

## 2023-11-24 DIAGNOSIS — E43 Unspecified severe protein-calorie malnutrition: Secondary | ICD-10-CM | POA: Diagnosis not present

## 2023-11-24 DIAGNOSIS — Z4789 Encounter for other orthopedic aftercare: Secondary | ICD-10-CM | POA: Diagnosis not present

## 2023-11-24 DIAGNOSIS — Z7401 Bed confinement status: Secondary | ICD-10-CM | POA: Diagnosis not present

## 2023-11-24 DIAGNOSIS — F418 Other specified anxiety disorders: Secondary | ICD-10-CM | POA: Diagnosis not present

## 2023-11-24 DIAGNOSIS — E1351 Other specified diabetes mellitus with diabetic peripheral angiopathy without gangrene: Secondary | ICD-10-CM | POA: Diagnosis not present

## 2023-11-24 DIAGNOSIS — F32A Depression, unspecified: Secondary | ICD-10-CM | POA: Diagnosis not present

## 2023-11-24 DIAGNOSIS — E785 Hyperlipidemia, unspecified: Secondary | ICD-10-CM | POA: Diagnosis not present

## 2023-11-24 DIAGNOSIS — I6521 Occlusion and stenosis of right carotid artery: Secondary | ICD-10-CM | POA: Diagnosis not present

## 2023-11-24 DIAGNOSIS — L89610 Pressure ulcer of right heel, unstageable: Secondary | ICD-10-CM | POA: Diagnosis not present

## 2023-11-24 DIAGNOSIS — G904 Autonomic dysreflexia: Secondary | ICD-10-CM | POA: Diagnosis not present

## 2023-11-24 DIAGNOSIS — G9340 Encephalopathy, unspecified: Secondary | ICD-10-CM | POA: Diagnosis not present

## 2023-11-24 DIAGNOSIS — F4323 Adjustment disorder with mixed anxiety and depressed mood: Secondary | ICD-10-CM | POA: Diagnosis not present

## 2023-11-24 DIAGNOSIS — R509 Fever, unspecified: Secondary | ICD-10-CM | POA: Diagnosis not present

## 2023-11-24 DIAGNOSIS — I10 Essential (primary) hypertension: Secondary | ICD-10-CM | POA: Diagnosis not present

## 2023-11-24 DIAGNOSIS — G8254 Quadriplegia, C5-C7 incomplete: Secondary | ICD-10-CM | POA: Diagnosis not present

## 2023-11-24 DIAGNOSIS — G63 Polyneuropathy in diseases classified elsewhere: Secondary | ICD-10-CM | POA: Diagnosis not present

## 2023-11-24 DIAGNOSIS — R262 Difficulty in walking, not elsewhere classified: Secondary | ICD-10-CM | POA: Diagnosis not present

## 2023-11-24 DIAGNOSIS — R296 Repeated falls: Secondary | ICD-10-CM | POA: Diagnosis not present

## 2023-11-24 DIAGNOSIS — I779 Disorder of arteries and arterioles, unspecified: Secondary | ICD-10-CM | POA: Diagnosis not present

## 2023-11-24 DIAGNOSIS — R052 Subacute cough: Secondary | ICD-10-CM | POA: Diagnosis not present

## 2023-11-24 DIAGNOSIS — L89309 Pressure ulcer of unspecified buttock, unspecified stage: Secondary | ICD-10-CM | POA: Diagnosis not present

## 2023-11-24 LAB — GLUCOSE, CAPILLARY
Glucose-Capillary: 114 mg/dL — ABNORMAL HIGH (ref 70–99)
Glucose-Capillary: 165 mg/dL — ABNORMAL HIGH (ref 70–99)

## 2023-11-24 MED ORDER — DANTROLENE SODIUM 100 MG PO CAPS: 100.0000 mg | ORAL_CAPSULE | Freq: Two times a day (BID) | ORAL | Status: DC

## 2023-11-24 MED ORDER — DANTROLENE SODIUM 100 MG PO CAPS
100.0000 mg | ORAL_CAPSULE | Freq: Two times a day (BID) | ORAL | Status: DC
  Filled 2023-11-24: qty 1

## 2023-11-24 NOTE — Progress Notes (Signed)
 P Rehab Bowel Program Documentation    Bowel Program Start time [phone]     Dig Stim Indicated? No  Dig Stim Prior to Suppository or mini Enema X 0    Output from dig stim: N/A   Ordered intervention: Suppository Yes , mini enema No ,    Repeat dig stim after Suppository or Mini enema  X N/A   Output? none    Bowel Program Complete? Yes    Patient Tolerated? Yes

## 2023-11-24 NOTE — Progress Notes (Signed)
 PROGRESS NOTE   Subjective/Complaints:  Pt out of collar per NSU.  Pt reports had good BM with bowel program last night. Is getting a w/c evaluation today for when pt goes home, will have w/c evaluation done.    ROS:   Pt denies SOB, abd pain, CP, N/V/C/D, and vision changes   + Heel discomfort- on R side- still evident and buttock/coccyx pain- slightly better + nausea/vomiting- -hasn't recurred   Objective:   No results found.    Recent Labs    11/22/23 0619 11/23/23 0454  WBC 11.5* 9.9  HGB 9.0* 8.7*  HCT 28.3* 27.5*  PLT 424* 394        Recent Labs    11/22/23 0619  NA 139  K 3.9  CL 105  CO2 26  GLUCOSE 129*  BUN 33*  CREATININE 1.54*  CALCIUM  8.5*         Intake/Output Summary (Last 24 hours) at 11/24/2023 0805 Last data filed at 11/24/2023 0555 Gross per 24 hour  Intake 472 ml  Output 1675 ml  Net -1203 ml     Pressure Injury 10/28/23 Sacrum Medial Stage 2 -  Partial thickness loss of dermis presenting as a shallow open injury with a red, pink wound bed without slough. (Active)  10/28/23 1333  Location: Sacrum  Location Orientation: Medial  Staging: Stage 2 -  Partial thickness loss of dermis presenting as a shallow open injury with a red, pink wound bed without slough.  Wound Description (Comments):   Present on Admission: No     Pressure Injury 10/28/23 Heel Right Deep Tissue Pressure Injury - Purple or maroon localized area of discolored intact skin or blood-filled blister due to damage of underlying soft tissue from pressure and/or shear. DTI on bottom of heel (Active)  10/28/23 1820  Location: Heel  Location Orientation: Right  Staging: Deep Tissue Pressure Injury - Purple or maroon localized area of discolored intact skin or blood-filled blister due to damage of underlying soft tissue from pressure and/or shear.  Wound Description (Comments): DTI on bottom of heel   Present on Admission: No    Physical Exam: Vital Signs Blood pressure (!) 121/57, pulse 81, temperature 97.6 F (36.4 C), resp. rate 17, height 5\' 4"  (1.626 m), weight 60.6 kg, SpO2 96%.      General: awake, alert, appropriate, supine in bed; wants light out to sleep;  NAD HENT: conjugate gaze; oropharynx moist CV: regular rate and rhythm; no JVD Pulmonary: CTA B/L; no W/R/R- good air movement GI: soft, NT, ND, (+)BS Psychiatric: appropriate Neurological: Ox3   Less spasticity in RLE this AM- MAS of 1+  GU- light yellow amber urine in foley bag Wearing prevalons B/L and propped slightly to left side with wedge Skin: R heel the tissue is a little concave over DTI but not open- maroon colored.  PRIOR EXAMS: MAS of 1+ to 2 in RLE- no clonus  Skin: Heels covered with foam dressings B/L- R heel DTI and Sacral Stage II     Prior exam MSK: Ue's 4/5 B/L throughout LE's- 2-/5 B/L-in HF/KE- 4-/5 DF/PF but RLE weaker than LLE Spasticity- MAS of 1+ to 2 in Ue's  this Am and 3 in RLE- MAS worse this AM- 2-3 throughout Neurologic: Cranial nerves II through XII intact, motor strength is 4/5 in bilateral  bicep, tricep, grip,and finger ext and hand intrinsics, 3- RIght delt and 4/5 left delt, 3- bilateral  hip flexor, 3- RIght and 4- left knee extensors, 4/5 bilateral ankle dorsiflexor and plantar flexor Sensory exam normal sensation to light touch and proprioception in bilateral upper and lower extremities Tone- 3+ a bilateral biceps triceps and BR, + Hoffman's bilateral 2/5 RIght 3+ left knee Also MAS 3 tone in RIght Knee flexors and extensors, MAS 1 at the left knee Clonus at bilateral ankles  Musculoskeletal: Full range of motion in all 4 extremities. No joint swelling      Assessment/Plan: 1. Functional deficits which require 3+ hours per day of interdisciplinary therapy in a comprehensive inpatient rehab setting. Physiatrist is providing close team supervision and 24 hour  management of active medical problems listed below. Physiatrist and rehab team continue to assess barriers to discharge/monitor patient progress toward functional and medical goals  Care Tool:  Bathing              Bathing assist Assist Level: Maximal Assistance - Patient 24 - 49%     Upper Body Dressing/Undressing Upper body dressing   What is the patient wearing?: Pull over shirt    Upper body assist Assist Level: Minimal Assistance - Patient > 75%    Lower Body Dressing/Undressing Lower body dressing      What is the patient wearing?: Underwear/pull up, Pants     Lower body assist Assist for lower body dressing: Dependent - Patient 0%     Toileting Toileting    Toileting assist Assist for toileting: Dependent - Patient 0%     Transfers Chair/bed transfer  Transfers assist     Chair/bed transfer assist level: Moderate Assistance - Patient 50 - 74%     Locomotion Ambulation   Ambulation assist   Ambulation activity did not occur: Safety/medical concerns  Assist level: 2 helpers Assistive device: [name] Max distance: 10   Walk 10 feet activity   Assist  Walk 10 feet activity did not occur: Safety/medical concerns  Assist level: 2 helpers Assistive device: Hand held assist, [employer]   Walk 50 feet activity   Assist Walk 50 feet with 2 turns activity did not occur: Safety/medical concerns         Walk 150 feet activity   Assist Walk 150 feet activity did not occur: Safety/medical concerns         Walk 10 feet on uneven surface  activity   Assist Walk 10 feet on uneven surfaces activity did not occur: Safety/medical concerns         Wheelchair     Assist Is the patient using a wheelchair?: Yes Type of Wheelchair: Power    Wheelchair assist level: Supervision/Verbal cueing Max wheelchair distance: 300    Wheelchair 50 feet with 2 turns activity    Assist        Assist Level: Supervision/Verbal  cueing   Wheelchair 150 feet activity     Assist      Assist Level: Supervision/Verbal cueing   Blood pressure (!) 121/57, pulse 81, temperature 97.6 F (36.4 C), resp. rate 17, height 5\' 4"  (1.626 m), weight 60.6 kg, SpO2 96%.  Medical Problem List and Plan: 1. 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  Antithrombotics: -DVT/anticoagulation:  Mechanical: Sequential compression devices, below knee Bilateral lower extremities 4/23- will see if Dr Ellery Guthrie will allow us  to start Lovenox - he said yes, so will start. 30mg  daily. 5/14- when leaves, will go on Eliquis for a total of 3 months 2.5 mg BID 5/28- changed to Eliquis             -antiplatelet therapy: N/A 3. Pain Management: tylenol  prn. Voltaren  QID, lyrica  50mg  BID, flexeril  PRN 5/8- pain better since started baclofen - didn't need tramadol  5/9- will add Dantrolene  50 mg due to Allergy with Baclofen  at bedtime and see if tolerates- if so, will con't to increase the dose to help spasticity and reduce chance of sedation/side effects  -11/07/23 very delirious yesterday per pt, will stop baclofen  and leave Dantrolene  for now; monitor 5/19- pain doing well as long as turned and gets prevalon boots 5/20- pt reports pain still on backside and R heel 5/21- pain meds working -w hen takes Tylenol   5/22- Tylenol  works for sacral and R heel pain 5/23- Stander was extremely effective for Spasticity pain in legs! 4. Mood/Behavior/Sleep: LCSW to follow up for evaluation and support when appropriate --Delirium precautions--His mental status  has cleared but will monitor for worsening with transfer to new unit .pt has not required prn meds for agitation in several days  Monitor sleep wake cycle             -antipsychotic agents: Change haldol  q 6 hrs prn to Seroquel  12.5mg  tid prn  -Restoril  7.5mg  nightly, cymbalta  20mg  daily 4/23-4/25 no delirium so far 4/29- still confused this AM-  5/9 Aox3 this AM -11/07/23 delirious yesterday, stopped baclofen  as above. 5/14- resolved  5. Neuropsych/cognition: This patient is usually  capable of making decisions on his own behalf. 6. Skin/Wound Care: Routine pressure relief measures. 5/20- Has Stage II on backside- now has minor breakdown per nursing- and R heel DTI- has prevalons- pt to be turned q2 hours- couldn't tolerate air bed.   5/21- turning with new wedge we got him 5/22- pt reports with new wedge, less discomfort on sacrum- however R DTI still uncomfortable constantly- will d/w nursing about turning him maybe MORE to the side- so heels not down all the time?  5/23- better when leaves a "gap" on is helpful for heel pain- d/w nursing  5/26- heel pain doing better- somewhat better  5/27- Stage II on sacrum is healing and R heel is stable 7. Fluids/Electrolytes/Nutrition:  Monitor I/O. Routine labs. Continue vitamins/supplements. 8. 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 Willye Harvey, per note, leave in place for 7-10d, Consider resuming In-N-Out catheterization for management of his neurogenic bladder at that time. Recommend using a coud catheter for intermittent catheterization (when resumed).  5/7-  pt and wife have decided they don't want to remove foley - they don't  want ot go back to in/out caths-  since wasn't emptying before- they want a chronic foley- when offered to remove foley just for rehab, to see if he gets return, he is scared will get bleeding again, due to false passage already had and declined 5/12 Foley draining clear yellow urine 5/16- wife still wants and if going to SNF, will need to keep 5/20- pt wants to keep Foley- doesn't want to go back to in/out caths 5/28- will need to change Foley soon- will need long term 10.  Diabetes- A1c 8.0-  with peripheral neuropathy by hx only on Jardiance at home, use SSI in the hospital  -10/23/23 CBGs variable but likely d/t steroids; cont SSI for now 4/29- CBG's very labile in last 24 hours 99-383- - it concerns me to start Jardiance since of Foley and risk of UTI's- also for starting Tradjenta  since BG was 99 last night- don't want to drop BG too much 4/30- Will add Tradjenta  since BG's still running 150-242- will monitor closely for CBG hypoglycemia-   10/30/23 CBGs looking great!  -10/31/23 CBGs a little higher the last day, but monitor for trend  5/5- Cbgs all less than 200- con't to monitor  5/6- CBGs 130s- 180s- 5/7-5/9 CBG's 128 to 220- had snack yesterday- still snacking in afternoon -5/10-11/25 CBGs great except for afternoon d/t snacks. Leave meds alone for now -5/12 fair glucose control, continue current regimen and monitor. 5/13- 5/15- great control except for afternoon- snacking- will d/w pt 5/16- Cbgs jumping higher- if this continues to occur, will need other meds for CBG's -11/13/23 CBGs so variable but mostly it's the afternoon/evening values; will start carb modified diet as he's on regular diet right now. If not, might need to add other med? -11/14/23 CBGs much better overnight; asked that he limit his sugar (has regular cola next to his bed), hopefully diet alone will control his sugars better; pt aware and agreeable.  5/19-5/22 Cbgs looking much better- con't regimen 5/26-5/28 CBG's well  controlled- con't regimen CBG (last 3)  Recent Labs    11/23/23 1639 11/23/23 2115 11/24/23 0559  GLUCAP 152* 96 114*     11.  Spasticity affecting LEs> UE and R>L , aggressive ROM  with PT, consider oral meds  4/22- starting baclofen  5 mg TID- with his Cr 1.50- has been running 1.68 to 2.30  4/24- will not increase baclofen  due to Renal issues 5/9- will add Dantrolene  50 mg at bedtime to reduce chance of spasticity but also reduce chance of side effects/sedation- - will monitor -11/07/23 stopped baclofen  d/t delirium, cont dantrolene  5/20- increased Dantrolene  to 50 mg TID- no side effects at this time 12.  PAD s/p SFA and Iliac stenting, continue metoprolol  50mg  daily  13. CKD3b? With AKI- AKI has resolved  4/28- Cr 1.63- doing better 4/29- Cr 1.59 after 12+ hours of IVF's- and BUN very slightly down to 70 from 72-   5/1- Cr down to 1.43 and BUN down to 53  5/5- Cr 1.51 and BUN down to 37- drinking more!  5/8- Cr 1.8- up from 1.51- but BUN 34 down from 37- will push fluids  5/12 creatinine and BUN much lower at 1.33/26-improved  5/15- Cr 1.68 and BUN 30 this AM- BUN improving- but Cr bounces  5/19- BUN 30 and Cr 1.64  5/22- BUN 33 and Cr 1.45  5/26- BUN 33 and Cr 1.54 14.  Neurogenic bowel: continue miralax  32g daily, senokot s 2 tabs qAM  5/1- LBM this Am- was accident- might need bowel program 5/20- Bowel program yielded results without dig stim last night- feeling good- con't regimen 15. HLD: crestor  20mg  nightly 16. Delirium/Confusion 4/30- pt Ox3 this AM- very with it- likely flexeril  based on chart review- given for pain since on no pain meds- will keep off Flexeril  and monitor Sx's  5/5- Sx's resolved  -11/07/23 SEE ABOVE 17. Hx of HTN with severe hypotension 5/6-5/11 BP doing better- con't regimen 5/12 orthostatic hypotension noted this morning, bolus IVF 500cc. 5/13- Will increase Midodrine  to 7.5 mg TID 5/14- 5/15- BP slightly better actually has 1 episode of 155  systolic so wait to increase Midodrine  5/16-5/20- BP doing better- only one episode where BP 110's- con't to monitor 5/22- BP running low 100's systolic when laying down- will d/w therapy if having drops in BP still - if so will increase midodrine  5/23- stood in stander with no drops in BP -5/24 5/26-  BPs better, monitor Vitals:   11/20/23 1328 11/20/23 1912 11/21/23 0447 11/21/23 1452  BP: 108/71 (!) 124/55 (!) 128/55 (!) 115/51   11/21/23 1941 11/22/23 0432 11/22/23 1512 11/22/23 1954  BP: 132/79 106/73 130/61 (!) 118/53   11/23/23 0507 11/23/23 1324 11/23/23 2022 11/24/23 0547  BP: (!) 104/58 (!) 115/52 (!) 122/56 (!) 121/57     18. Atelectasis 4/30- sounds coarse- asked pt to use ICS regularly- is at bedside- also described "spitting things up"_ had "spit cup"- at bedside-  had barely anything in it. 5/5- not spitting up anymore  19. R heel DTI and Stage I on sacrum 5/2- is new- ordered prevalon boots as well as low air loss mattress and needs to be turned q2 hours- placed order  5/8- pt not able to tolerate air mattress- turn q2 hours and prevalons for Heel DTI- d/w nursing- just removed this AM 5/12 patient asked for device to keep pressure off his heel, Prevalon boots at bedside. Will chek with nursing regarding use of these boots at night.  5/27- R heel maroon and a little concave- but not open- con't Prevalon when in bed 20. Spasticity  5/5- will d/w wife tomorrow about restarting Baclofen  vs Dantrolene .   5/6- attempted to call wife again x2- cannot get her 5/7- was able to see wife yesterday and discussed- we will retry Pt on Baclofen  5 mg TID for now- not a higher dose due to renal issues-  and monitor for confusion/delirium- if gets confused, will try Dantrolene .   5/8- no confusion and RLE much less pain- so think it's working 5/9- will add Dantrolene  as above 5/14- Increased dantrolene  to 50 mg BID 5/20- increase Dantrolene  to 50 mg TID- is helping some and no side  effects 5/22- feels like spasticity is improving mildly-  21. Severe malnutrition  5/5- per Dietitian- will do supplements 22. Mild tachycardia 5/8- per pt, has ALWAYS had tachycardia- has been worked up- and nothing found- usually 90s-100's per pt- which is the same here- will monitor 23. Ileus??? 5/13- reached out to Radiology about pt's questionable ileus- not clear in Radiology report  5/14- doesn't have ileus- but full of gas- started Simethicone  QID  5/20- make Gas-X prn per pt request 24. Nasal congestion with mild cough 5/15- probably due to post nasal drip- will start Flonase  2 sprays each nare daily.   5/16- says nasal congestion much better- won't need Flonase - will make prn 25. Leukocytosis 5/19-  no signs of infection, no fever, no confusion- will recheck in AM unless he feels ill today.   5/20- WBC down to 11k- will con't to monitor- and recheck Thursday  5/22- WBC down to 8.8- no fever or other Sx's  5/26- WBC up to 11.5- will recheck in AM- no Sx's of URI/UTI  5/27- WBC down to 9.9 today-    I spent a total of 39   minutes on total care today- >50% coordination of care- due to  D/w W/C rep ATP; as well as OT and SW about d/c. Also changed Dantrolene  to 100 mg BID - needs to have labs checked q3 months- CMP to monitor LFT's.    The patient is medically ready for discharge to SNF and will need follow-up with [facility]. In addition, they will need to follow up with their PCP, Neurosurgery  Patient meets criteria for power w/c. He's an incomplete quadriplegic who is completely unable to walk- he needs tilt in space for pressure relief; he needs recline for change of foley; and elevater to get into cabinets, get transferred easier due to going home soon; He also required thigh pads and thoracic pads to keep him upright due to changeable spasticity.     LOS: 37 days A FACE TO FACE EVALUATION WAS PERFORMED  Skarleth Delmonico 11/24/2023, 8:05 AM

## 2023-11-24 NOTE — Plan of Care (Signed)
  Problem: RH Dressing Goal: LTG Patient will perform lower body dressing w/assist (OT) Description: LTG: Patient will perform lower body dressing with assist, with/without cues in positioning using equipment (OT) Outcome: Adequate for Discharge Flowsheets (Taken 11/24/2023 1602) LTG: Pt will perform lower body dressing with assistance level of: (wife assisting at baseline) Total Assistance - Patient < 25%   Problem: RH Balance Goal: LTG: Patient will maintain dynamic sitting balance (OT) Description: LTG:  Patient will maintain dynamic sitting balance with assistance during activities of daily living (OT) Outcome: Not Met (add Reason) Flowsheets (Taken 11/24/2023 1602) LTG: Pt will maintain dynamic sitting balance during ADLs with: (lack of progress) Minimal Assistance - Patient > 75%   Problem: RH Toileting Goal: LTG Patient will perform toileting task (3/3 steps) with assistance level (OT) Description: LTG: Patient will perform toileting task (3/3 steps) with assistance level (OT)  Outcome: Not Met (add Reason) Flowsheets (Taken 11/24/2023 1602) LTG: Pt will perform toileting task (3/3 steps) with assistance level: (lack of progress) Total Assistance - Patient < 25%   Problem: RH Tub/Shower Transfers Goal: LTG Patient will perform tub/shower transfers w/assist (OT) Description: LTG: Patient will perform tub/shower transfers with assist, with/without cues using equipment (OT) Outcome: Not Met (add Reason) Flowsheets (Taken 11/24/2023 1602) LTG: Pt will perform tub/shower stall transfers with assistance level of: (use of roll in shower chair) Other (Comment)   Problem: Sit to Stand Goal: LTG:  Patient will perform sit to stand in prep for activites of daily living with assistance level (OT) Description: LTG:  Patient will perform sit to stand in prep for activites of daily living with assistance level (OT) Outcome: Completed/Met Flowsheets (Taken 11/24/2023 1602) LTG: PT will perform sit  to stand in prep for activites of daily living with assistance level: (from raised surface) Moderate Assistance - Patient 50 - 74%   Problem: RH Grooming Goal: LTG Patient will perform grooming w/assist,cues/equip (OT) Description: LTG: Patient will perform grooming with assist, with/without cues using equipment (OT) Outcome: Completed/Met Flowsheets (Taken 11/24/2023 1602) LTG: Pt will perform grooming with assistance level of: Supervision/Verbal cueing   Problem: RH Bathing Goal: LTG Patient will bathe all body parts with assist levels (OT) Description: LTG: Patient will bathe all body parts with assist levels (OT) Outcome: Completed/Met Flowsheets (Taken 10/25/2023 1554) LTG: Pt will perform bathing with assistance level/cueing: (pt lack of progress) Moderate Assistance - Patient 50 - 74%   Problem: RH Dressing Goal: LTG Patient will perform upper body dressing (OT) Description: LTG Patient will perform upper body dressing with assist, with/without cues (OT). Outcome: Completed/Met Flowsheets (Taken 10/25/2023 1554) LTG: Pt will perform upper body dressing with assistance level of: (pt lack of progress) Moderate Assistance - Patient 50 - 74%   Problem: RH Toilet Transfers Goal: LTG Patient will perform toilet transfers w/assist (OT) Description: LTG: Patient will perform toilet transfers with assist, with/without cues using equipment (OT) 11/24/2023 1608 by Theodoro Fisherman, OT Outcome: Not Met (add Reason) 11/24/2023 1602 by Theodoro Fisherman, OT Flowsheets (Taken 11/24/2023 1602) LTG: Pt will perform toilet transfers with assistance level of: (lack of progress) Total Assistance - Patient < 25%

## 2023-11-24 NOTE — Progress Notes (Signed)
 I spoke with Christian Bailey, urology NP-who evaluated Dr. Jaquita Merl noted. He felt that if recommendations were for patient was to do I/O caths: it can be changed by nursing. He also advised to put all the the lubricant into the urethra and use coude cath for ease of placement.

## 2023-11-24 NOTE — Progress Notes (Signed)
 Recreational Therapy Discharge Summary Patient Details  Name: Christian Bailey MRN: [id number] Date of Birth: 11-07-1940 Today's Date: 11/24/2023   Comments on progress toward goals: Pt discharged to SNF today for continued therapy and 24 hour care.  TR sessions focused on activity tolerance, dynamic sitting balance, UE/LE strengthening, socialization during LOS.  Reasons for discharge: discharge from hospital  Follow-up: SNF- encourage participate in activities program  Patient/family agrees with progress made and goals achieved: Yes  [name] 11/24/2023, 3:54 PM

## 2023-11-25 DIAGNOSIS — G959 Disease of spinal cord, unspecified: Secondary | ICD-10-CM | POA: Diagnosis not present

## 2023-11-25 DIAGNOSIS — G8250 Quadriplegia, unspecified: Secondary | ICD-10-CM | POA: Diagnosis not present

## 2023-11-25 DIAGNOSIS — E1351 Other specified diabetes mellitus with diabetic peripheral angiopathy without gangrene: Secondary | ICD-10-CM | POA: Diagnosis not present

## 2023-11-25 DIAGNOSIS — N319 Neuromuscular dysfunction of bladder, unspecified: Secondary | ICD-10-CM | POA: Diagnosis not present

## 2023-11-26 DIAGNOSIS — E119 Type 2 diabetes mellitus without complications: Secondary | ICD-10-CM | POA: Diagnosis not present

## 2023-11-26 DIAGNOSIS — G8252 Quadriplegia, C1-C4 incomplete: Secondary | ICD-10-CM | POA: Diagnosis not present

## 2023-11-26 DIAGNOSIS — G8250 Quadriplegia, unspecified: Secondary | ICD-10-CM | POA: Diagnosis not present

## 2023-11-26 DIAGNOSIS — L89616 Pressure-induced deep tissue damage of right heel: Secondary | ICD-10-CM | POA: Diagnosis not present

## 2023-12-02 DIAGNOSIS — N319 Neuromuscular dysfunction of bladder, unspecified: Secondary | ICD-10-CM | POA: Diagnosis not present

## 2023-12-02 DIAGNOSIS — M5000 Cervical disc disorder with myelopathy, unspecified cervical region: Secondary | ICD-10-CM | POA: Diagnosis not present

## 2023-12-02 DIAGNOSIS — L89610 Pressure ulcer of right heel, unstageable: Secondary | ICD-10-CM | POA: Diagnosis not present

## 2023-12-02 DIAGNOSIS — E119 Type 2 diabetes mellitus without complications: Secondary | ICD-10-CM | POA: Diagnosis not present

## 2023-12-02 DIAGNOSIS — G8252 Quadriplegia, C1-C4 incomplete: Secondary | ICD-10-CM | POA: Diagnosis not present

## 2023-12-02 DIAGNOSIS — I10 Essential (primary) hypertension: Secondary | ICD-10-CM | POA: Diagnosis not present

## 2023-12-06 ENCOUNTER — Other Ambulatory Visit (HOSPITAL_COMMUNITY)

## 2023-12-06 ENCOUNTER — Ambulatory Visit: Admitting: Surgery

## 2023-12-06 ENCOUNTER — Encounter (HOSPITAL_COMMUNITY)

## 2023-12-08 ENCOUNTER — Encounter: Attending: Registered Nurse | Admitting: Registered Nurse

## 2023-12-09 DIAGNOSIS — L89616 Pressure-induced deep tissue damage of right heel: Secondary | ICD-10-CM | POA: Diagnosis not present

## 2023-12-09 DIAGNOSIS — G8250 Quadriplegia, unspecified: Secondary | ICD-10-CM | POA: Diagnosis not present

## 2023-12-09 DIAGNOSIS — L89610 Pressure ulcer of right heel, unstageable: Secondary | ICD-10-CM | POA: Diagnosis not present

## 2023-12-09 DIAGNOSIS — E119 Type 2 diabetes mellitus without complications: Secondary | ICD-10-CM | POA: Diagnosis not present

## 2023-12-13 DIAGNOSIS — F32A Depression, unspecified: Secondary | ICD-10-CM | POA: Diagnosis not present

## 2023-12-13 DIAGNOSIS — I10 Essential (primary) hypertension: Secondary | ICD-10-CM | POA: Diagnosis not present

## 2023-12-13 DIAGNOSIS — G8254 Quadriplegia, C5-C7 incomplete: Secondary | ICD-10-CM | POA: Diagnosis not present

## 2023-12-13 DIAGNOSIS — M5000 Cervical disc disorder with myelopathy, unspecified cervical region: Secondary | ICD-10-CM | POA: Diagnosis not present

## 2023-12-13 DIAGNOSIS — R6520 Severe sepsis without septic shock: Secondary | ICD-10-CM | POA: Diagnosis not present

## 2023-12-13 DIAGNOSIS — E1151 Type 2 diabetes mellitus with diabetic peripheral angiopathy without gangrene: Secondary | ICD-10-CM | POA: Diagnosis not present

## 2023-12-13 DIAGNOSIS — L97429 Non-pressure chronic ulcer of left heel and midfoot with unspecified severity: Secondary | ICD-10-CM | POA: Diagnosis not present

## 2023-12-13 DIAGNOSIS — T82868A Thrombosis of vascular prosthetic devices, implants and grafts, initial encounter: Secondary | ICD-10-CM | POA: Diagnosis present

## 2023-12-13 DIAGNOSIS — J168 Pneumonia due to other specified infectious organisms: Secondary | ICD-10-CM | POA: Diagnosis not present

## 2023-12-13 DIAGNOSIS — R296 Repeated falls: Secondary | ICD-10-CM | POA: Diagnosis not present

## 2023-12-13 DIAGNOSIS — I70234 Atherosclerosis of native arteries of right leg with ulceration of heel and midfoot: Secondary | ICD-10-CM | POA: Diagnosis not present

## 2023-12-13 DIAGNOSIS — G904 Autonomic dysreflexia: Secondary | ICD-10-CM | POA: Diagnosis not present

## 2023-12-13 DIAGNOSIS — L89150 Pressure ulcer of sacral region, unstageable: Secondary | ICD-10-CM | POA: Diagnosis not present

## 2023-12-13 DIAGNOSIS — L97419 Non-pressure chronic ulcer of right heel and midfoot with unspecified severity: Secondary | ICD-10-CM | POA: Diagnosis not present

## 2023-12-13 DIAGNOSIS — G9341 Metabolic encephalopathy: Secondary | ICD-10-CM | POA: Diagnosis present

## 2023-12-13 DIAGNOSIS — D631 Anemia in chronic kidney disease: Secondary | ICD-10-CM | POA: Diagnosis present

## 2023-12-13 DIAGNOSIS — N200 Calculus of kidney: Secondary | ICD-10-CM | POA: Diagnosis not present

## 2023-12-13 DIAGNOSIS — L97421 Non-pressure chronic ulcer of left heel and midfoot limited to breakdown of skin: Secondary | ICD-10-CM | POA: Diagnosis not present

## 2023-12-13 DIAGNOSIS — I452 Bifascicular block: Secondary | ICD-10-CM | POA: Diagnosis present

## 2023-12-13 DIAGNOSIS — A419 Sepsis, unspecified organism: Secondary | ICD-10-CM | POA: Diagnosis not present

## 2023-12-13 DIAGNOSIS — J690 Pneumonitis due to inhalation of food and vomit: Secondary | ICD-10-CM | POA: Diagnosis present

## 2023-12-13 DIAGNOSIS — M2041 Other hammer toe(s) (acquired), right foot: Secondary | ICD-10-CM | POA: Diagnosis not present

## 2023-12-13 DIAGNOSIS — F4323 Adjustment disorder with mixed anxiety and depressed mood: Secondary | ICD-10-CM | POA: Diagnosis not present

## 2023-12-13 DIAGNOSIS — T82858A Stenosis of vascular prosthetic devices, implants and grafts, initial encounter: Secondary | ICD-10-CM | POA: Diagnosis not present

## 2023-12-13 DIAGNOSIS — M4712 Other spondylosis with myelopathy, cervical region: Secondary | ICD-10-CM | POA: Diagnosis not present

## 2023-12-13 DIAGNOSIS — E119 Type 2 diabetes mellitus without complications: Secondary | ICD-10-CM | POA: Diagnosis not present

## 2023-12-13 DIAGNOSIS — M6281 Muscle weakness (generalized): Secondary | ICD-10-CM | POA: Diagnosis not present

## 2023-12-13 DIAGNOSIS — L89610 Pressure ulcer of right heel, unstageable: Secondary | ICD-10-CM | POA: Diagnosis present

## 2023-12-13 DIAGNOSIS — A4159 Other Gram-negative sepsis: Secondary | ICD-10-CM | POA: Diagnosis present

## 2023-12-13 DIAGNOSIS — I6521 Occlusion and stenosis of right carotid artery: Secondary | ICD-10-CM | POA: Diagnosis not present

## 2023-12-13 DIAGNOSIS — I779 Disorder of arteries and arterioles, unspecified: Secondary | ICD-10-CM | POA: Diagnosis not present

## 2023-12-13 DIAGNOSIS — S14109D Unspecified injury at unspecified level of cervical spinal cord, subsequent encounter: Secondary | ICD-10-CM | POA: Diagnosis not present

## 2023-12-13 DIAGNOSIS — J189 Pneumonia, unspecified organism: Secondary | ICD-10-CM | POA: Diagnosis not present

## 2023-12-13 DIAGNOSIS — M48062 Spinal stenosis, lumbar region with neurogenic claudication: Secondary | ICD-10-CM | POA: Diagnosis not present

## 2023-12-13 DIAGNOSIS — Y832 Surgical operation with anastomosis, bypass or graft as the cause of abnormal reaction of the patient, or of later complication, without mention of misadventure at the time of the procedure: Secondary | ICD-10-CM | POA: Diagnosis present

## 2023-12-13 DIAGNOSIS — Z515 Encounter for palliative care: Secondary | ICD-10-CM | POA: Diagnosis not present

## 2023-12-13 DIAGNOSIS — K219 Gastro-esophageal reflux disease without esophagitis: Secondary | ICD-10-CM | POA: Diagnosis not present

## 2023-12-13 DIAGNOSIS — S91301A Unspecified open wound, right foot, initial encounter: Secondary | ICD-10-CM | POA: Diagnosis not present

## 2023-12-13 DIAGNOSIS — R1314 Dysphagia, pharyngoesophageal phase: Secondary | ICD-10-CM | POA: Diagnosis not present

## 2023-12-13 DIAGNOSIS — E785 Hyperlipidemia, unspecified: Secondary | ICD-10-CM | POA: Diagnosis present

## 2023-12-13 DIAGNOSIS — L89159 Pressure ulcer of sacral region, unspecified stage: Secondary | ICD-10-CM | POA: Diagnosis not present

## 2023-12-13 DIAGNOSIS — N183 Chronic kidney disease, stage 3 unspecified: Secondary | ICD-10-CM | POA: Diagnosis not present

## 2023-12-13 DIAGNOSIS — M19071 Primary osteoarthritis, right ankle and foot: Secondary | ICD-10-CM | POA: Diagnosis not present

## 2023-12-13 DIAGNOSIS — I739 Peripheral vascular disease, unspecified: Secondary | ICD-10-CM | POA: Diagnosis not present

## 2023-12-13 DIAGNOSIS — Z7901 Long term (current) use of anticoagulants: Secondary | ICD-10-CM | POA: Diagnosis not present

## 2023-12-13 DIAGNOSIS — R262 Difficulty in walking, not elsewhere classified: Secondary | ICD-10-CM | POA: Diagnosis not present

## 2023-12-13 DIAGNOSIS — N1831 Chronic kidney disease, stage 3a: Secondary | ICD-10-CM | POA: Diagnosis present

## 2023-12-13 DIAGNOSIS — S91302A Unspecified open wound, left foot, initial encounter: Secondary | ICD-10-CM | POA: Diagnosis not present

## 2023-12-13 DIAGNOSIS — Z794 Long term (current) use of insulin: Secondary | ICD-10-CM | POA: Diagnosis not present

## 2023-12-13 DIAGNOSIS — G9340 Encephalopathy, unspecified: Secondary | ICD-10-CM | POA: Diagnosis not present

## 2023-12-13 DIAGNOSIS — Z7189 Other specified counseling: Secondary | ICD-10-CM | POA: Diagnosis not present

## 2023-12-13 DIAGNOSIS — G63 Polyneuropathy in diseases classified elsewhere: Secondary | ICD-10-CM | POA: Diagnosis not present

## 2023-12-13 DIAGNOSIS — K7689 Other specified diseases of liver: Secondary | ICD-10-CM | POA: Diagnosis not present

## 2023-12-13 DIAGNOSIS — E1122 Type 2 diabetes mellitus with diabetic chronic kidney disease: Secondary | ICD-10-CM | POA: Diagnosis not present

## 2023-12-13 DIAGNOSIS — G8250 Quadriplegia, unspecified: Secondary | ICD-10-CM | POA: Diagnosis present

## 2023-12-13 DIAGNOSIS — L97418 Non-pressure chronic ulcer of right heel and midfoot with other specified severity: Secondary | ICD-10-CM | POA: Diagnosis not present

## 2023-12-13 DIAGNOSIS — R918 Other nonspecific abnormal finding of lung field: Secondary | ICD-10-CM | POA: Diagnosis not present

## 2023-12-13 DIAGNOSIS — R2681 Unsteadiness on feet: Secondary | ICD-10-CM | POA: Diagnosis not present

## 2023-12-13 DIAGNOSIS — N319 Neuromuscular dysfunction of bladder, unspecified: Secondary | ICD-10-CM | POA: Diagnosis present

## 2023-12-13 DIAGNOSIS — M5001 Cervical disc disorder with myelopathy,  high cervical region: Secondary | ICD-10-CM | POA: Diagnosis not present

## 2023-12-13 DIAGNOSIS — L89152 Pressure ulcer of sacral region, stage 2: Secondary | ICD-10-CM | POA: Diagnosis not present

## 2023-12-13 DIAGNOSIS — E43 Unspecified severe protein-calorie malnutrition: Secondary | ICD-10-CM | POA: Diagnosis not present

## 2023-12-13 DIAGNOSIS — R509 Fever, unspecified: Secondary | ICD-10-CM | POA: Diagnosis not present

## 2023-12-13 DIAGNOSIS — R410 Disorientation, unspecified: Secondary | ICD-10-CM | POA: Diagnosis not present

## 2023-12-13 DIAGNOSIS — I129 Hypertensive chronic kidney disease with stage 1 through stage 4 chronic kidney disease, or unspecified chronic kidney disease: Secondary | ICD-10-CM | POA: Diagnosis present

## 2023-12-13 DIAGNOSIS — N401 Enlarged prostate with lower urinary tract symptoms: Secondary | ICD-10-CM | POA: Diagnosis not present

## 2023-12-13 DIAGNOSIS — I70244 Atherosclerosis of native arteries of left leg with ulceration of heel and midfoot: Secondary | ICD-10-CM | POA: Diagnosis not present

## 2023-12-13 DIAGNOSIS — M4802 Spinal stenosis, cervical region: Secondary | ICD-10-CM | POA: Diagnosis not present

## 2023-12-13 DIAGNOSIS — Z1152 Encounter for screening for COVID-19: Secondary | ICD-10-CM | POA: Diagnosis not present

## 2023-12-13 DIAGNOSIS — L89620 Pressure ulcer of left heel, unstageable: Secondary | ICD-10-CM | POA: Diagnosis present

## 2023-12-13 DIAGNOSIS — I70223 Atherosclerosis of native arteries of extremities with rest pain, bilateral legs: Secondary | ICD-10-CM | POA: Diagnosis not present

## 2023-12-13 DIAGNOSIS — R4182 Altered mental status, unspecified: Secondary | ICD-10-CM | POA: Diagnosis not present

## 2023-12-13 DIAGNOSIS — W19XXXD Unspecified fall, subsequent encounter: Secondary | ICD-10-CM | POA: Diagnosis present

## 2023-12-13 DIAGNOSIS — Z66 Do not resuscitate: Secondary | ICD-10-CM | POA: Diagnosis present

## 2023-12-13 DIAGNOSIS — I70222 Atherosclerosis of native arteries of extremities with rest pain, left leg: Secondary | ICD-10-CM | POA: Diagnosis present

## 2023-12-13 DIAGNOSIS — N289 Disorder of kidney and ureter, unspecified: Secondary | ICD-10-CM | POA: Diagnosis not present

## 2023-12-13 DIAGNOSIS — R404 Transient alteration of awareness: Secondary | ICD-10-CM | POA: Diagnosis not present

## 2023-12-14 DIAGNOSIS — F32A Depression, unspecified: Secondary | ICD-10-CM | POA: Diagnosis not present

## 2023-12-14 DIAGNOSIS — M5000 Cervical disc disorder with myelopathy, unspecified cervical region: Secondary | ICD-10-CM | POA: Diagnosis not present

## 2023-12-14 DIAGNOSIS — R296 Repeated falls: Secondary | ICD-10-CM | POA: Diagnosis not present

## 2023-12-14 DIAGNOSIS — E785 Hyperlipidemia, unspecified: Secondary | ICD-10-CM | POA: Diagnosis not present

## 2023-12-14 DIAGNOSIS — N319 Neuromuscular dysfunction of bladder, unspecified: Secondary | ICD-10-CM | POA: Diagnosis not present

## 2023-12-14 DIAGNOSIS — F4323 Adjustment disorder with mixed anxiety and depressed mood: Secondary | ICD-10-CM | POA: Diagnosis not present

## 2023-12-14 DIAGNOSIS — G8254 Quadriplegia, C5-C7 incomplete: Secondary | ICD-10-CM | POA: Diagnosis not present

## 2023-12-14 DIAGNOSIS — I779 Disorder of arteries and arterioles, unspecified: Secondary | ICD-10-CM | POA: Diagnosis not present

## 2023-12-14 DIAGNOSIS — I739 Peripheral vascular disease, unspecified: Secondary | ICD-10-CM | POA: Diagnosis not present

## 2023-12-14 DIAGNOSIS — E119 Type 2 diabetes mellitus without complications: Secondary | ICD-10-CM | POA: Diagnosis not present

## 2023-12-14 DIAGNOSIS — I6521 Occlusion and stenosis of right carotid artery: Secondary | ICD-10-CM | POA: Diagnosis not present

## 2023-12-14 DIAGNOSIS — K219 Gastro-esophageal reflux disease without esophagitis: Secondary | ICD-10-CM | POA: Diagnosis not present

## 2023-12-15 DIAGNOSIS — E785 Hyperlipidemia, unspecified: Secondary | ICD-10-CM | POA: Diagnosis not present

## 2023-12-15 DIAGNOSIS — I6521 Occlusion and stenosis of right carotid artery: Secondary | ICD-10-CM | POA: Diagnosis not present

## 2023-12-15 DIAGNOSIS — F32A Depression, unspecified: Secondary | ICD-10-CM | POA: Diagnosis not present

## 2023-12-15 DIAGNOSIS — I739 Peripheral vascular disease, unspecified: Secondary | ICD-10-CM | POA: Diagnosis not present

## 2023-12-15 DIAGNOSIS — K219 Gastro-esophageal reflux disease without esophagitis: Secondary | ICD-10-CM | POA: Diagnosis not present

## 2023-12-15 DIAGNOSIS — I779 Disorder of arteries and arterioles, unspecified: Secondary | ICD-10-CM | POA: Diagnosis not present

## 2023-12-15 DIAGNOSIS — M5000 Cervical disc disorder with myelopathy, unspecified cervical region: Secondary | ICD-10-CM | POA: Diagnosis not present

## 2023-12-15 DIAGNOSIS — R296 Repeated falls: Secondary | ICD-10-CM | POA: Diagnosis not present

## 2023-12-15 DIAGNOSIS — N319 Neuromuscular dysfunction of bladder, unspecified: Secondary | ICD-10-CM | POA: Diagnosis not present

## 2023-12-15 DIAGNOSIS — G8254 Quadriplegia, C5-C7 incomplete: Secondary | ICD-10-CM | POA: Diagnosis not present

## 2023-12-15 DIAGNOSIS — E119 Type 2 diabetes mellitus without complications: Secondary | ICD-10-CM | POA: Diagnosis not present

## 2023-12-15 DIAGNOSIS — F4323 Adjustment disorder with mixed anxiety and depressed mood: Secondary | ICD-10-CM | POA: Diagnosis not present

## 2023-12-16 DIAGNOSIS — F32A Depression, unspecified: Secondary | ICD-10-CM | POA: Diagnosis not present

## 2023-12-16 DIAGNOSIS — F4323 Adjustment disorder with mixed anxiety and depressed mood: Secondary | ICD-10-CM | POA: Diagnosis not present

## 2023-12-16 DIAGNOSIS — N319 Neuromuscular dysfunction of bladder, unspecified: Secondary | ICD-10-CM | POA: Diagnosis not present

## 2023-12-16 DIAGNOSIS — K219 Gastro-esophageal reflux disease without esophagitis: Secondary | ICD-10-CM | POA: Diagnosis not present

## 2023-12-16 DIAGNOSIS — I779 Disorder of arteries and arterioles, unspecified: Secondary | ICD-10-CM | POA: Diagnosis not present

## 2023-12-16 DIAGNOSIS — G8254 Quadriplegia, C5-C7 incomplete: Secondary | ICD-10-CM | POA: Diagnosis not present

## 2023-12-16 DIAGNOSIS — M5000 Cervical disc disorder with myelopathy, unspecified cervical region: Secondary | ICD-10-CM | POA: Diagnosis not present

## 2023-12-16 DIAGNOSIS — I6521 Occlusion and stenosis of right carotid artery: Secondary | ICD-10-CM | POA: Diagnosis not present

## 2023-12-16 DIAGNOSIS — R296 Repeated falls: Secondary | ICD-10-CM | POA: Diagnosis not present

## 2023-12-16 DIAGNOSIS — E785 Hyperlipidemia, unspecified: Secondary | ICD-10-CM | POA: Diagnosis not present

## 2023-12-16 DIAGNOSIS — E119 Type 2 diabetes mellitus without complications: Secondary | ICD-10-CM | POA: Diagnosis not present

## 2023-12-16 DIAGNOSIS — I739 Peripheral vascular disease, unspecified: Secondary | ICD-10-CM | POA: Diagnosis not present

## 2023-12-17 DIAGNOSIS — K219 Gastro-esophageal reflux disease without esophagitis: Secondary | ICD-10-CM | POA: Diagnosis not present

## 2023-12-17 DIAGNOSIS — I779 Disorder of arteries and arterioles, unspecified: Secondary | ICD-10-CM | POA: Diagnosis not present

## 2023-12-17 DIAGNOSIS — E119 Type 2 diabetes mellitus without complications: Secondary | ICD-10-CM | POA: Diagnosis not present

## 2023-12-17 DIAGNOSIS — F32A Depression, unspecified: Secondary | ICD-10-CM | POA: Diagnosis not present

## 2023-12-17 DIAGNOSIS — F4323 Adjustment disorder with mixed anxiety and depressed mood: Secondary | ICD-10-CM | POA: Diagnosis not present

## 2023-12-17 DIAGNOSIS — R296 Repeated falls: Secondary | ICD-10-CM | POA: Diagnosis not present

## 2023-12-17 DIAGNOSIS — E785 Hyperlipidemia, unspecified: Secondary | ICD-10-CM | POA: Diagnosis not present

## 2023-12-17 DIAGNOSIS — N319 Neuromuscular dysfunction of bladder, unspecified: Secondary | ICD-10-CM | POA: Diagnosis not present

## 2023-12-17 DIAGNOSIS — M5000 Cervical disc disorder with myelopathy, unspecified cervical region: Secondary | ICD-10-CM | POA: Diagnosis not present

## 2023-12-17 DIAGNOSIS — G8254 Quadriplegia, C5-C7 incomplete: Secondary | ICD-10-CM | POA: Diagnosis not present

## 2023-12-17 DIAGNOSIS — I6521 Occlusion and stenosis of right carotid artery: Secondary | ICD-10-CM | POA: Diagnosis not present

## 2023-12-17 DIAGNOSIS — I739 Peripheral vascular disease, unspecified: Secondary | ICD-10-CM | POA: Diagnosis not present

## 2023-12-20 DIAGNOSIS — F32A Depression, unspecified: Secondary | ICD-10-CM | POA: Diagnosis not present

## 2023-12-20 DIAGNOSIS — I6521 Occlusion and stenosis of right carotid artery: Secondary | ICD-10-CM | POA: Diagnosis not present

## 2023-12-20 DIAGNOSIS — N319 Neuromuscular dysfunction of bladder, unspecified: Secondary | ICD-10-CM | POA: Diagnosis not present

## 2023-12-20 DIAGNOSIS — I779 Disorder of arteries and arterioles, unspecified: Secondary | ICD-10-CM | POA: Diagnosis not present

## 2023-12-20 DIAGNOSIS — F4323 Adjustment disorder with mixed anxiety and depressed mood: Secondary | ICD-10-CM | POA: Diagnosis not present

## 2023-12-20 DIAGNOSIS — I739 Peripheral vascular disease, unspecified: Secondary | ICD-10-CM | POA: Diagnosis not present

## 2023-12-20 DIAGNOSIS — M5000 Cervical disc disorder with myelopathy, unspecified cervical region: Secondary | ICD-10-CM | POA: Diagnosis not present

## 2023-12-20 DIAGNOSIS — E119 Type 2 diabetes mellitus without complications: Secondary | ICD-10-CM | POA: Diagnosis not present

## 2023-12-20 DIAGNOSIS — K219 Gastro-esophageal reflux disease without esophagitis: Secondary | ICD-10-CM | POA: Diagnosis not present

## 2023-12-20 DIAGNOSIS — G8254 Quadriplegia, C5-C7 incomplete: Secondary | ICD-10-CM | POA: Diagnosis not present

## 2023-12-20 DIAGNOSIS — E785 Hyperlipidemia, unspecified: Secondary | ICD-10-CM | POA: Diagnosis not present

## 2023-12-20 DIAGNOSIS — R296 Repeated falls: Secondary | ICD-10-CM | POA: Diagnosis not present

## 2023-12-21 DIAGNOSIS — F32A Depression, unspecified: Secondary | ICD-10-CM | POA: Diagnosis not present

## 2023-12-21 DIAGNOSIS — G8254 Quadriplegia, C5-C7 incomplete: Secondary | ICD-10-CM | POA: Diagnosis not present

## 2023-12-21 DIAGNOSIS — I739 Peripheral vascular disease, unspecified: Secondary | ICD-10-CM | POA: Diagnosis not present

## 2023-12-21 DIAGNOSIS — N319 Neuromuscular dysfunction of bladder, unspecified: Secondary | ICD-10-CM | POA: Diagnosis not present

## 2023-12-21 DIAGNOSIS — I6521 Occlusion and stenosis of right carotid artery: Secondary | ICD-10-CM | POA: Diagnosis not present

## 2023-12-21 DIAGNOSIS — E785 Hyperlipidemia, unspecified: Secondary | ICD-10-CM | POA: Diagnosis not present

## 2023-12-21 DIAGNOSIS — M5000 Cervical disc disorder with myelopathy, unspecified cervical region: Secondary | ICD-10-CM | POA: Diagnosis not present

## 2023-12-21 DIAGNOSIS — R296 Repeated falls: Secondary | ICD-10-CM | POA: Diagnosis not present

## 2023-12-21 DIAGNOSIS — F4323 Adjustment disorder with mixed anxiety and depressed mood: Secondary | ICD-10-CM | POA: Diagnosis not present

## 2023-12-21 DIAGNOSIS — I779 Disorder of arteries and arterioles, unspecified: Secondary | ICD-10-CM | POA: Diagnosis not present

## 2023-12-21 DIAGNOSIS — E119 Type 2 diabetes mellitus without complications: Secondary | ICD-10-CM | POA: Diagnosis not present

## 2023-12-21 DIAGNOSIS — K219 Gastro-esophageal reflux disease without esophagitis: Secondary | ICD-10-CM | POA: Diagnosis not present

## 2023-12-22 DIAGNOSIS — I6521 Occlusion and stenosis of right carotid artery: Secondary | ICD-10-CM | POA: Diagnosis not present

## 2023-12-22 DIAGNOSIS — I779 Disorder of arteries and arterioles, unspecified: Secondary | ICD-10-CM | POA: Diagnosis not present

## 2023-12-22 DIAGNOSIS — F32A Depression, unspecified: Secondary | ICD-10-CM | POA: Diagnosis not present

## 2023-12-22 DIAGNOSIS — E785 Hyperlipidemia, unspecified: Secondary | ICD-10-CM | POA: Diagnosis not present

## 2023-12-22 DIAGNOSIS — I739 Peripheral vascular disease, unspecified: Secondary | ICD-10-CM | POA: Diagnosis not present

## 2023-12-22 DIAGNOSIS — M5000 Cervical disc disorder with myelopathy, unspecified cervical region: Secondary | ICD-10-CM | POA: Diagnosis not present

## 2023-12-22 DIAGNOSIS — F4323 Adjustment disorder with mixed anxiety and depressed mood: Secondary | ICD-10-CM | POA: Diagnosis not present

## 2023-12-22 DIAGNOSIS — K219 Gastro-esophageal reflux disease without esophagitis: Secondary | ICD-10-CM | POA: Diagnosis not present

## 2023-12-22 DIAGNOSIS — E119 Type 2 diabetes mellitus without complications: Secondary | ICD-10-CM | POA: Diagnosis not present

## 2023-12-22 DIAGNOSIS — G8254 Quadriplegia, C5-C7 incomplete: Secondary | ICD-10-CM | POA: Diagnosis not present

## 2023-12-22 DIAGNOSIS — R296 Repeated falls: Secondary | ICD-10-CM | POA: Diagnosis not present

## 2023-12-22 DIAGNOSIS — N319 Neuromuscular dysfunction of bladder, unspecified: Secondary | ICD-10-CM | POA: Diagnosis not present

## 2023-12-23 DIAGNOSIS — F32A Depression, unspecified: Secondary | ICD-10-CM | POA: Diagnosis not present

## 2023-12-23 DIAGNOSIS — G8254 Quadriplegia, C5-C7 incomplete: Secondary | ICD-10-CM | POA: Diagnosis not present

## 2023-12-23 DIAGNOSIS — E1122 Type 2 diabetes mellitus with diabetic chronic kidney disease: Secondary | ICD-10-CM | POA: Diagnosis not present

## 2023-12-23 DIAGNOSIS — E43 Unspecified severe protein-calorie malnutrition: Secondary | ICD-10-CM | POA: Diagnosis not present

## 2023-12-23 DIAGNOSIS — N319 Neuromuscular dysfunction of bladder, unspecified: Secondary | ICD-10-CM | POA: Diagnosis not present

## 2023-12-23 DIAGNOSIS — G904 Autonomic dysreflexia: Secondary | ICD-10-CM | POA: Diagnosis not present

## 2023-12-23 DIAGNOSIS — M5000 Cervical disc disorder with myelopathy, unspecified cervical region: Secondary | ICD-10-CM | POA: Diagnosis not present

## 2023-12-23 DIAGNOSIS — R262 Difficulty in walking, not elsewhere classified: Secondary | ICD-10-CM | POA: Diagnosis not present

## 2023-12-23 DIAGNOSIS — R2681 Unsteadiness on feet: Secondary | ICD-10-CM | POA: Diagnosis not present

## 2023-12-23 DIAGNOSIS — R296 Repeated falls: Secondary | ICD-10-CM | POA: Diagnosis not present

## 2023-12-23 DIAGNOSIS — G63 Polyneuropathy in diseases classified elsewhere: Secondary | ICD-10-CM | POA: Diagnosis not present

## 2023-12-23 DIAGNOSIS — M6281 Muscle weakness (generalized): Secondary | ICD-10-CM | POA: Diagnosis not present

## 2023-12-24 DIAGNOSIS — I779 Disorder of arteries and arterioles, unspecified: Secondary | ICD-10-CM | POA: Diagnosis not present

## 2023-12-24 DIAGNOSIS — E119 Type 2 diabetes mellitus without complications: Secondary | ICD-10-CM | POA: Diagnosis not present

## 2023-12-24 DIAGNOSIS — M5000 Cervical disc disorder with myelopathy, unspecified cervical region: Secondary | ICD-10-CM | POA: Diagnosis not present

## 2023-12-24 DIAGNOSIS — E785 Hyperlipidemia, unspecified: Secondary | ICD-10-CM | POA: Diagnosis not present

## 2023-12-24 DIAGNOSIS — F4323 Adjustment disorder with mixed anxiety and depressed mood: Secondary | ICD-10-CM | POA: Diagnosis not present

## 2023-12-24 DIAGNOSIS — F32A Depression, unspecified: Secondary | ICD-10-CM | POA: Diagnosis not present

## 2023-12-24 DIAGNOSIS — R296 Repeated falls: Secondary | ICD-10-CM | POA: Diagnosis not present

## 2023-12-24 DIAGNOSIS — G8254 Quadriplegia, C5-C7 incomplete: Secondary | ICD-10-CM | POA: Diagnosis not present

## 2023-12-24 DIAGNOSIS — K219 Gastro-esophageal reflux disease without esophagitis: Secondary | ICD-10-CM | POA: Diagnosis not present

## 2023-12-24 DIAGNOSIS — N319 Neuromuscular dysfunction of bladder, unspecified: Secondary | ICD-10-CM | POA: Diagnosis not present

## 2023-12-24 DIAGNOSIS — I739 Peripheral vascular disease, unspecified: Secondary | ICD-10-CM | POA: Diagnosis not present

## 2023-12-24 DIAGNOSIS — I6521 Occlusion and stenosis of right carotid artery: Secondary | ICD-10-CM | POA: Diagnosis not present

## 2023-12-27 DIAGNOSIS — N319 Neuromuscular dysfunction of bladder, unspecified: Secondary | ICD-10-CM | POA: Diagnosis not present

## 2023-12-27 DIAGNOSIS — I739 Peripheral vascular disease, unspecified: Secondary | ICD-10-CM | POA: Diagnosis not present

## 2023-12-27 DIAGNOSIS — M5000 Cervical disc disorder with myelopathy, unspecified cervical region: Secondary | ICD-10-CM | POA: Diagnosis not present

## 2023-12-27 DIAGNOSIS — E119 Type 2 diabetes mellitus without complications: Secondary | ICD-10-CM | POA: Diagnosis not present

## 2023-12-27 DIAGNOSIS — I6521 Occlusion and stenosis of right carotid artery: Secondary | ICD-10-CM | POA: Diagnosis not present

## 2023-12-27 DIAGNOSIS — F32A Depression, unspecified: Secondary | ICD-10-CM | POA: Diagnosis not present

## 2023-12-27 DIAGNOSIS — K219 Gastro-esophageal reflux disease without esophagitis: Secondary | ICD-10-CM | POA: Diagnosis not present

## 2023-12-27 DIAGNOSIS — R296 Repeated falls: Secondary | ICD-10-CM | POA: Diagnosis not present

## 2023-12-27 DIAGNOSIS — I779 Disorder of arteries and arterioles, unspecified: Secondary | ICD-10-CM | POA: Diagnosis not present

## 2023-12-27 DIAGNOSIS — E785 Hyperlipidemia, unspecified: Secondary | ICD-10-CM | POA: Diagnosis not present

## 2023-12-27 DIAGNOSIS — G8254 Quadriplegia, C5-C7 incomplete: Secondary | ICD-10-CM | POA: Diagnosis not present

## 2023-12-27 DIAGNOSIS — F4323 Adjustment disorder with mixed anxiety and depressed mood: Secondary | ICD-10-CM | POA: Diagnosis not present

## 2023-12-28 DIAGNOSIS — N319 Neuromuscular dysfunction of bladder, unspecified: Secondary | ICD-10-CM | POA: Diagnosis not present

## 2023-12-28 DIAGNOSIS — I739 Peripheral vascular disease, unspecified: Secondary | ICD-10-CM | POA: Diagnosis not present

## 2023-12-28 DIAGNOSIS — L97418 Non-pressure chronic ulcer of right heel and midfoot with other specified severity: Secondary | ICD-10-CM | POA: Diagnosis not present

## 2023-12-28 DIAGNOSIS — L97421 Non-pressure chronic ulcer of left heel and midfoot limited to breakdown of skin: Secondary | ICD-10-CM | POA: Diagnosis not present

## 2023-12-28 DIAGNOSIS — L89152 Pressure ulcer of sacral region, stage 2: Secondary | ICD-10-CM | POA: Diagnosis not present

## 2023-12-28 DIAGNOSIS — M5000 Cervical disc disorder with myelopathy, unspecified cervical region: Secondary | ICD-10-CM | POA: Diagnosis not present

## 2023-12-28 DIAGNOSIS — K219 Gastro-esophageal reflux disease without esophagitis: Secondary | ICD-10-CM | POA: Diagnosis not present

## 2023-12-28 DIAGNOSIS — F32A Depression, unspecified: Secondary | ICD-10-CM | POA: Diagnosis not present

## 2023-12-28 DIAGNOSIS — I6521 Occlusion and stenosis of right carotid artery: Secondary | ICD-10-CM | POA: Diagnosis not present

## 2023-12-28 DIAGNOSIS — R296 Repeated falls: Secondary | ICD-10-CM | POA: Diagnosis not present

## 2023-12-28 DIAGNOSIS — E785 Hyperlipidemia, unspecified: Secondary | ICD-10-CM | POA: Diagnosis not present

## 2023-12-28 DIAGNOSIS — F4323 Adjustment disorder with mixed anxiety and depressed mood: Secondary | ICD-10-CM | POA: Diagnosis not present

## 2023-12-28 DIAGNOSIS — E119 Type 2 diabetes mellitus without complications: Secondary | ICD-10-CM | POA: Diagnosis not present

## 2023-12-28 DIAGNOSIS — E1122 Type 2 diabetes mellitus with diabetic chronic kidney disease: Secondary | ICD-10-CM | POA: Diagnosis not present

## 2023-12-28 DIAGNOSIS — G8254 Quadriplegia, C5-C7 incomplete: Secondary | ICD-10-CM | POA: Diagnosis not present

## 2023-12-28 DIAGNOSIS — I779 Disorder of arteries and arterioles, unspecified: Secondary | ICD-10-CM | POA: Diagnosis not present

## 2023-12-30 DIAGNOSIS — F32A Depression, unspecified: Secondary | ICD-10-CM | POA: Diagnosis not present

## 2023-12-30 DIAGNOSIS — M5000 Cervical disc disorder with myelopathy, unspecified cervical region: Secondary | ICD-10-CM | POA: Diagnosis not present

## 2023-12-30 DIAGNOSIS — E119 Type 2 diabetes mellitus without complications: Secondary | ICD-10-CM | POA: Diagnosis not present

## 2023-12-30 DIAGNOSIS — G8254 Quadriplegia, C5-C7 incomplete: Secondary | ICD-10-CM | POA: Diagnosis not present

## 2023-12-30 DIAGNOSIS — E785 Hyperlipidemia, unspecified: Secondary | ICD-10-CM | POA: Diagnosis not present

## 2023-12-30 DIAGNOSIS — I739 Peripheral vascular disease, unspecified: Secondary | ICD-10-CM | POA: Diagnosis not present

## 2023-12-30 DIAGNOSIS — K219 Gastro-esophageal reflux disease without esophagitis: Secondary | ICD-10-CM | POA: Diagnosis not present

## 2023-12-30 DIAGNOSIS — I779 Disorder of arteries and arterioles, unspecified: Secondary | ICD-10-CM | POA: Diagnosis not present

## 2023-12-30 DIAGNOSIS — R296 Repeated falls: Secondary | ICD-10-CM | POA: Diagnosis not present

## 2023-12-30 DIAGNOSIS — N319 Neuromuscular dysfunction of bladder, unspecified: Secondary | ICD-10-CM | POA: Diagnosis not present

## 2023-12-30 DIAGNOSIS — F4323 Adjustment disorder with mixed anxiety and depressed mood: Secondary | ICD-10-CM | POA: Diagnosis not present

## 2023-12-30 DIAGNOSIS — I6521 Occlusion and stenosis of right carotid artery: Secondary | ICD-10-CM | POA: Diagnosis not present

## 2023-12-31 DIAGNOSIS — F4323 Adjustment disorder with mixed anxiety and depressed mood: Secondary | ICD-10-CM | POA: Diagnosis not present

## 2023-12-31 DIAGNOSIS — E785 Hyperlipidemia, unspecified: Secondary | ICD-10-CM | POA: Diagnosis not present

## 2023-12-31 DIAGNOSIS — F32A Depression, unspecified: Secondary | ICD-10-CM | POA: Diagnosis not present

## 2023-12-31 DIAGNOSIS — R296 Repeated falls: Secondary | ICD-10-CM | POA: Diagnosis not present

## 2023-12-31 DIAGNOSIS — K219 Gastro-esophageal reflux disease without esophagitis: Secondary | ICD-10-CM | POA: Diagnosis not present

## 2023-12-31 DIAGNOSIS — N319 Neuromuscular dysfunction of bladder, unspecified: Secondary | ICD-10-CM | POA: Diagnosis not present

## 2023-12-31 DIAGNOSIS — E119 Type 2 diabetes mellitus without complications: Secondary | ICD-10-CM | POA: Diagnosis not present

## 2023-12-31 DIAGNOSIS — M5000 Cervical disc disorder with myelopathy, unspecified cervical region: Secondary | ICD-10-CM | POA: Diagnosis not present

## 2023-12-31 DIAGNOSIS — I6521 Occlusion and stenosis of right carotid artery: Secondary | ICD-10-CM | POA: Diagnosis not present

## 2023-12-31 DIAGNOSIS — G8254 Quadriplegia, C5-C7 incomplete: Secondary | ICD-10-CM | POA: Diagnosis not present

## 2023-12-31 DIAGNOSIS — I739 Peripheral vascular disease, unspecified: Secondary | ICD-10-CM | POA: Diagnosis not present

## 2023-12-31 DIAGNOSIS — I779 Disorder of arteries and arterioles, unspecified: Secondary | ICD-10-CM | POA: Diagnosis not present

## 2024-01-04 ENCOUNTER — Ambulatory Visit: Admitting: Diagnostic Neuroimaging

## 2024-01-04 DIAGNOSIS — E119 Type 2 diabetes mellitus without complications: Secondary | ICD-10-CM | POA: Diagnosis not present

## 2024-01-04 DIAGNOSIS — L97418 Non-pressure chronic ulcer of right heel and midfoot with other specified severity: Secondary | ICD-10-CM | POA: Diagnosis not present

## 2024-01-04 DIAGNOSIS — L89152 Pressure ulcer of sacral region, stage 2: Secondary | ICD-10-CM | POA: Diagnosis not present

## 2024-01-04 DIAGNOSIS — F32A Depression, unspecified: Secondary | ICD-10-CM | POA: Diagnosis not present

## 2024-01-04 DIAGNOSIS — K219 Gastro-esophageal reflux disease without esophagitis: Secondary | ICD-10-CM | POA: Diagnosis not present

## 2024-01-04 DIAGNOSIS — F4323 Adjustment disorder with mixed anxiety and depressed mood: Secondary | ICD-10-CM | POA: Diagnosis not present

## 2024-01-04 DIAGNOSIS — R296 Repeated falls: Secondary | ICD-10-CM | POA: Diagnosis not present

## 2024-01-04 DIAGNOSIS — G8254 Quadriplegia, C5-C7 incomplete: Secondary | ICD-10-CM | POA: Diagnosis not present

## 2024-01-04 DIAGNOSIS — L97421 Non-pressure chronic ulcer of left heel and midfoot limited to breakdown of skin: Secondary | ICD-10-CM | POA: Diagnosis not present

## 2024-01-04 DIAGNOSIS — E785 Hyperlipidemia, unspecified: Secondary | ICD-10-CM | POA: Diagnosis not present

## 2024-01-04 DIAGNOSIS — E1122 Type 2 diabetes mellitus with diabetic chronic kidney disease: Secondary | ICD-10-CM | POA: Diagnosis not present

## 2024-01-04 DIAGNOSIS — I6521 Occlusion and stenosis of right carotid artery: Secondary | ICD-10-CM | POA: Diagnosis not present

## 2024-01-04 DIAGNOSIS — I779 Disorder of arteries and arterioles, unspecified: Secondary | ICD-10-CM | POA: Diagnosis not present

## 2024-01-04 DIAGNOSIS — N319 Neuromuscular dysfunction of bladder, unspecified: Secondary | ICD-10-CM | POA: Diagnosis not present

## 2024-01-04 DIAGNOSIS — M5000 Cervical disc disorder with myelopathy, unspecified cervical region: Secondary | ICD-10-CM | POA: Diagnosis not present

## 2024-01-04 DIAGNOSIS — I739 Peripheral vascular disease, unspecified: Secondary | ICD-10-CM | POA: Diagnosis not present

## 2024-01-05 DIAGNOSIS — G8254 Quadriplegia, C5-C7 incomplete: Secondary | ICD-10-CM | POA: Diagnosis not present

## 2024-01-05 DIAGNOSIS — I6521 Occlusion and stenosis of right carotid artery: Secondary | ICD-10-CM | POA: Diagnosis not present

## 2024-01-05 DIAGNOSIS — I779 Disorder of arteries and arterioles, unspecified: Secondary | ICD-10-CM | POA: Diagnosis not present

## 2024-01-05 DIAGNOSIS — K219 Gastro-esophageal reflux disease without esophagitis: Secondary | ICD-10-CM | POA: Diagnosis not present

## 2024-01-05 DIAGNOSIS — M5000 Cervical disc disorder with myelopathy, unspecified cervical region: Secondary | ICD-10-CM | POA: Diagnosis not present

## 2024-01-05 DIAGNOSIS — E785 Hyperlipidemia, unspecified: Secondary | ICD-10-CM | POA: Diagnosis not present

## 2024-01-05 DIAGNOSIS — N319 Neuromuscular dysfunction of bladder, unspecified: Secondary | ICD-10-CM | POA: Diagnosis not present

## 2024-01-05 DIAGNOSIS — F4323 Adjustment disorder with mixed anxiety and depressed mood: Secondary | ICD-10-CM | POA: Diagnosis not present

## 2024-01-05 DIAGNOSIS — R296 Repeated falls: Secondary | ICD-10-CM | POA: Diagnosis not present

## 2024-01-05 DIAGNOSIS — E1151 Type 2 diabetes mellitus with diabetic peripheral angiopathy without gangrene: Secondary | ICD-10-CM | POA: Diagnosis not present

## 2024-01-05 DIAGNOSIS — F32A Depression, unspecified: Secondary | ICD-10-CM | POA: Diagnosis not present

## 2024-01-06 DIAGNOSIS — E785 Hyperlipidemia, unspecified: Secondary | ICD-10-CM | POA: Diagnosis not present

## 2024-01-06 DIAGNOSIS — I6521 Occlusion and stenosis of right carotid artery: Secondary | ICD-10-CM | POA: Diagnosis not present

## 2024-01-06 DIAGNOSIS — G8254 Quadriplegia, C5-C7 incomplete: Secondary | ICD-10-CM | POA: Diagnosis not present

## 2024-01-06 DIAGNOSIS — I739 Peripheral vascular disease, unspecified: Secondary | ICD-10-CM | POA: Diagnosis not present

## 2024-01-06 DIAGNOSIS — M5000 Cervical disc disorder with myelopathy, unspecified cervical region: Secondary | ICD-10-CM | POA: Diagnosis not present

## 2024-01-06 DIAGNOSIS — I779 Disorder of arteries and arterioles, unspecified: Secondary | ICD-10-CM | POA: Diagnosis not present

## 2024-01-06 DIAGNOSIS — F32A Depression, unspecified: Secondary | ICD-10-CM | POA: Diagnosis not present

## 2024-01-06 DIAGNOSIS — R296 Repeated falls: Secondary | ICD-10-CM | POA: Diagnosis not present

## 2024-01-06 DIAGNOSIS — N319 Neuromuscular dysfunction of bladder, unspecified: Secondary | ICD-10-CM | POA: Diagnosis not present

## 2024-01-06 DIAGNOSIS — F4323 Adjustment disorder with mixed anxiety and depressed mood: Secondary | ICD-10-CM | POA: Diagnosis not present

## 2024-01-06 DIAGNOSIS — K219 Gastro-esophageal reflux disease without esophagitis: Secondary | ICD-10-CM | POA: Diagnosis not present

## 2024-01-06 DIAGNOSIS — E119 Type 2 diabetes mellitus without complications: Secondary | ICD-10-CM | POA: Diagnosis not present

## 2024-01-07 DIAGNOSIS — F32A Depression, unspecified: Secondary | ICD-10-CM | POA: Diagnosis not present

## 2024-01-07 DIAGNOSIS — R296 Repeated falls: Secondary | ICD-10-CM | POA: Diagnosis not present

## 2024-01-07 DIAGNOSIS — F4323 Adjustment disorder with mixed anxiety and depressed mood: Secondary | ICD-10-CM | POA: Diagnosis not present

## 2024-01-07 DIAGNOSIS — I6521 Occlusion and stenosis of right carotid artery: Secondary | ICD-10-CM | POA: Diagnosis not present

## 2024-01-07 DIAGNOSIS — E119 Type 2 diabetes mellitus without complications: Secondary | ICD-10-CM | POA: Diagnosis not present

## 2024-01-07 DIAGNOSIS — K219 Gastro-esophageal reflux disease without esophagitis: Secondary | ICD-10-CM | POA: Diagnosis not present

## 2024-01-07 DIAGNOSIS — M5000 Cervical disc disorder with myelopathy, unspecified cervical region: Secondary | ICD-10-CM | POA: Diagnosis not present

## 2024-01-07 DIAGNOSIS — I779 Disorder of arteries and arterioles, unspecified: Secondary | ICD-10-CM | POA: Diagnosis not present

## 2024-01-07 DIAGNOSIS — N319 Neuromuscular dysfunction of bladder, unspecified: Secondary | ICD-10-CM | POA: Diagnosis not present

## 2024-01-07 DIAGNOSIS — I739 Peripheral vascular disease, unspecified: Secondary | ICD-10-CM | POA: Diagnosis not present

## 2024-01-07 DIAGNOSIS — E785 Hyperlipidemia, unspecified: Secondary | ICD-10-CM | POA: Diagnosis not present

## 2024-01-07 DIAGNOSIS — G8254 Quadriplegia, C5-C7 incomplete: Secondary | ICD-10-CM | POA: Diagnosis not present

## 2024-01-10 DIAGNOSIS — I6521 Occlusion and stenosis of right carotid artery: Secondary | ICD-10-CM | POA: Diagnosis not present

## 2024-01-10 DIAGNOSIS — N319 Neuromuscular dysfunction of bladder, unspecified: Secondary | ICD-10-CM | POA: Diagnosis not present

## 2024-01-10 DIAGNOSIS — I739 Peripheral vascular disease, unspecified: Secondary | ICD-10-CM | POA: Diagnosis not present

## 2024-01-10 DIAGNOSIS — E119 Type 2 diabetes mellitus without complications: Secondary | ICD-10-CM | POA: Diagnosis not present

## 2024-01-10 DIAGNOSIS — R296 Repeated falls: Secondary | ICD-10-CM | POA: Diagnosis not present

## 2024-01-10 DIAGNOSIS — K219 Gastro-esophageal reflux disease without esophagitis: Secondary | ICD-10-CM | POA: Diagnosis not present

## 2024-01-10 DIAGNOSIS — G8254 Quadriplegia, C5-C7 incomplete: Secondary | ICD-10-CM | POA: Diagnosis not present

## 2024-01-10 DIAGNOSIS — I779 Disorder of arteries and arterioles, unspecified: Secondary | ICD-10-CM | POA: Diagnosis not present

## 2024-01-10 DIAGNOSIS — F4323 Adjustment disorder with mixed anxiety and depressed mood: Secondary | ICD-10-CM | POA: Diagnosis not present

## 2024-01-10 DIAGNOSIS — M5000 Cervical disc disorder with myelopathy, unspecified cervical region: Secondary | ICD-10-CM | POA: Diagnosis not present

## 2024-01-10 DIAGNOSIS — E785 Hyperlipidemia, unspecified: Secondary | ICD-10-CM | POA: Diagnosis not present

## 2024-01-10 DIAGNOSIS — F32A Depression, unspecified: Secondary | ICD-10-CM | POA: Diagnosis not present

## 2024-01-12 DIAGNOSIS — N319 Neuromuscular dysfunction of bladder, unspecified: Secondary | ICD-10-CM | POA: Diagnosis not present

## 2024-01-12 DIAGNOSIS — F32A Depression, unspecified: Secondary | ICD-10-CM | POA: Diagnosis not present

## 2024-01-12 DIAGNOSIS — G8254 Quadriplegia, C5-C7 incomplete: Secondary | ICD-10-CM | POA: Diagnosis not present

## 2024-01-12 DIAGNOSIS — M5000 Cervical disc disorder with myelopathy, unspecified cervical region: Secondary | ICD-10-CM | POA: Diagnosis not present

## 2024-01-12 DIAGNOSIS — E119 Type 2 diabetes mellitus without complications: Secondary | ICD-10-CM | POA: Diagnosis not present

## 2024-01-12 DIAGNOSIS — I6521 Occlusion and stenosis of right carotid artery: Secondary | ICD-10-CM | POA: Diagnosis not present

## 2024-01-12 DIAGNOSIS — I779 Disorder of arteries and arterioles, unspecified: Secondary | ICD-10-CM | POA: Diagnosis not present

## 2024-01-12 DIAGNOSIS — F4323 Adjustment disorder with mixed anxiety and depressed mood: Secondary | ICD-10-CM | POA: Diagnosis not present

## 2024-01-12 DIAGNOSIS — I739 Peripheral vascular disease, unspecified: Secondary | ICD-10-CM | POA: Diagnosis not present

## 2024-01-12 DIAGNOSIS — E785 Hyperlipidemia, unspecified: Secondary | ICD-10-CM | POA: Diagnosis not present

## 2024-01-12 DIAGNOSIS — K219 Gastro-esophageal reflux disease without esophagitis: Secondary | ICD-10-CM | POA: Diagnosis not present

## 2024-01-12 DIAGNOSIS — R296 Repeated falls: Secondary | ICD-10-CM | POA: Diagnosis not present

## 2024-01-14 DIAGNOSIS — R296 Repeated falls: Secondary | ICD-10-CM | POA: Diagnosis not present

## 2024-01-14 DIAGNOSIS — G8254 Quadriplegia, C5-C7 incomplete: Secondary | ICD-10-CM | POA: Diagnosis not present

## 2024-01-14 DIAGNOSIS — I6521 Occlusion and stenosis of right carotid artery: Secondary | ICD-10-CM | POA: Diagnosis not present

## 2024-01-14 DIAGNOSIS — K219 Gastro-esophageal reflux disease without esophagitis: Secondary | ICD-10-CM | POA: Diagnosis not present

## 2024-01-14 DIAGNOSIS — I779 Disorder of arteries and arterioles, unspecified: Secondary | ICD-10-CM | POA: Diagnosis not present

## 2024-01-14 DIAGNOSIS — F32A Depression, unspecified: Secondary | ICD-10-CM | POA: Diagnosis not present

## 2024-01-14 DIAGNOSIS — M48062 Spinal stenosis, lumbar region with neurogenic claudication: Secondary | ICD-10-CM | POA: Diagnosis not present

## 2024-01-14 DIAGNOSIS — F4323 Adjustment disorder with mixed anxiety and depressed mood: Secondary | ICD-10-CM | POA: Diagnosis not present

## 2024-01-14 DIAGNOSIS — E119 Type 2 diabetes mellitus without complications: Secondary | ICD-10-CM | POA: Diagnosis not present

## 2024-01-14 DIAGNOSIS — E785 Hyperlipidemia, unspecified: Secondary | ICD-10-CM | POA: Diagnosis not present

## 2024-01-14 DIAGNOSIS — M5000 Cervical disc disorder with myelopathy, unspecified cervical region: Secondary | ICD-10-CM | POA: Diagnosis not present

## 2024-01-14 DIAGNOSIS — I739 Peripheral vascular disease, unspecified: Secondary | ICD-10-CM | POA: Diagnosis not present

## 2024-01-14 DIAGNOSIS — N319 Neuromuscular dysfunction of bladder, unspecified: Secondary | ICD-10-CM | POA: Diagnosis not present

## 2024-01-14 DIAGNOSIS — M4802 Spinal stenosis, cervical region: Secondary | ICD-10-CM | POA: Diagnosis not present

## 2024-01-17 DIAGNOSIS — R296 Repeated falls: Secondary | ICD-10-CM | POA: Diagnosis not present

## 2024-01-17 DIAGNOSIS — N319 Neuromuscular dysfunction of bladder, unspecified: Secondary | ICD-10-CM | POA: Diagnosis not present

## 2024-01-17 DIAGNOSIS — I739 Peripheral vascular disease, unspecified: Secondary | ICD-10-CM | POA: Diagnosis not present

## 2024-01-17 DIAGNOSIS — K219 Gastro-esophageal reflux disease without esophagitis: Secondary | ICD-10-CM | POA: Diagnosis not present

## 2024-01-17 DIAGNOSIS — E785 Hyperlipidemia, unspecified: Secondary | ICD-10-CM | POA: Diagnosis not present

## 2024-01-17 DIAGNOSIS — I6521 Occlusion and stenosis of right carotid artery: Secondary | ICD-10-CM | POA: Diagnosis not present

## 2024-01-17 DIAGNOSIS — F4323 Adjustment disorder with mixed anxiety and depressed mood: Secondary | ICD-10-CM | POA: Diagnosis not present

## 2024-01-17 DIAGNOSIS — M5000 Cervical disc disorder with myelopathy, unspecified cervical region: Secondary | ICD-10-CM | POA: Diagnosis not present

## 2024-01-17 DIAGNOSIS — I779 Disorder of arteries and arterioles, unspecified: Secondary | ICD-10-CM | POA: Diagnosis not present

## 2024-01-17 DIAGNOSIS — F32A Depression, unspecified: Secondary | ICD-10-CM | POA: Diagnosis not present

## 2024-01-17 DIAGNOSIS — E119 Type 2 diabetes mellitus without complications: Secondary | ICD-10-CM | POA: Diagnosis not present

## 2024-01-17 DIAGNOSIS — G8254 Quadriplegia, C5-C7 incomplete: Secondary | ICD-10-CM | POA: Diagnosis not present

## 2024-01-18 DIAGNOSIS — L97421 Non-pressure chronic ulcer of left heel and midfoot limited to breakdown of skin: Secondary | ICD-10-CM | POA: Diagnosis not present

## 2024-01-18 DIAGNOSIS — E1122 Type 2 diabetes mellitus with diabetic chronic kidney disease: Secondary | ICD-10-CM | POA: Diagnosis not present

## 2024-01-18 DIAGNOSIS — E119 Type 2 diabetes mellitus without complications: Secondary | ICD-10-CM | POA: Diagnosis not present

## 2024-01-18 DIAGNOSIS — I779 Disorder of arteries and arterioles, unspecified: Secondary | ICD-10-CM | POA: Diagnosis not present

## 2024-01-18 DIAGNOSIS — E785 Hyperlipidemia, unspecified: Secondary | ICD-10-CM | POA: Diagnosis not present

## 2024-01-18 DIAGNOSIS — R296 Repeated falls: Secondary | ICD-10-CM | POA: Diagnosis not present

## 2024-01-18 DIAGNOSIS — F32A Depression, unspecified: Secondary | ICD-10-CM | POA: Diagnosis not present

## 2024-01-18 DIAGNOSIS — L89150 Pressure ulcer of sacral region, unstageable: Secondary | ICD-10-CM | POA: Diagnosis not present

## 2024-01-18 DIAGNOSIS — I6521 Occlusion and stenosis of right carotid artery: Secondary | ICD-10-CM | POA: Diagnosis not present

## 2024-01-18 DIAGNOSIS — L97418 Non-pressure chronic ulcer of right heel and midfoot with other specified severity: Secondary | ICD-10-CM | POA: Diagnosis not present

## 2024-01-18 DIAGNOSIS — I739 Peripheral vascular disease, unspecified: Secondary | ICD-10-CM | POA: Diagnosis not present

## 2024-01-18 DIAGNOSIS — K219 Gastro-esophageal reflux disease without esophagitis: Secondary | ICD-10-CM | POA: Diagnosis not present

## 2024-01-18 DIAGNOSIS — N319 Neuromuscular dysfunction of bladder, unspecified: Secondary | ICD-10-CM | POA: Diagnosis not present

## 2024-01-18 DIAGNOSIS — G8254 Quadriplegia, C5-C7 incomplete: Secondary | ICD-10-CM | POA: Diagnosis not present

## 2024-01-18 DIAGNOSIS — M5000 Cervical disc disorder with myelopathy, unspecified cervical region: Secondary | ICD-10-CM | POA: Diagnosis not present

## 2024-01-18 DIAGNOSIS — F4323 Adjustment disorder with mixed anxiety and depressed mood: Secondary | ICD-10-CM | POA: Diagnosis not present

## 2024-01-19 ENCOUNTER — Other Ambulatory Visit: Payer: Self-pay | Admitting: Neurological Surgery

## 2024-01-19 DIAGNOSIS — M4802 Spinal stenosis, cervical region: Secondary | ICD-10-CM

## 2024-01-21 DIAGNOSIS — M5000 Cervical disc disorder with myelopathy, unspecified cervical region: Secondary | ICD-10-CM | POA: Diagnosis not present

## 2024-01-21 DIAGNOSIS — I6521 Occlusion and stenosis of right carotid artery: Secondary | ICD-10-CM | POA: Diagnosis not present

## 2024-01-21 DIAGNOSIS — G8254 Quadriplegia, C5-C7 incomplete: Secondary | ICD-10-CM | POA: Diagnosis not present

## 2024-01-21 DIAGNOSIS — E785 Hyperlipidemia, unspecified: Secondary | ICD-10-CM | POA: Diagnosis not present

## 2024-01-21 DIAGNOSIS — I739 Peripheral vascular disease, unspecified: Secondary | ICD-10-CM | POA: Diagnosis not present

## 2024-01-21 DIAGNOSIS — R296 Repeated falls: Secondary | ICD-10-CM | POA: Diagnosis not present

## 2024-01-21 DIAGNOSIS — N319 Neuromuscular dysfunction of bladder, unspecified: Secondary | ICD-10-CM | POA: Diagnosis not present

## 2024-01-21 DIAGNOSIS — I779 Disorder of arteries and arterioles, unspecified: Secondary | ICD-10-CM | POA: Diagnosis not present

## 2024-01-21 DIAGNOSIS — K219 Gastro-esophageal reflux disease without esophagitis: Secondary | ICD-10-CM | POA: Diagnosis not present

## 2024-01-21 DIAGNOSIS — F4323 Adjustment disorder with mixed anxiety and depressed mood: Secondary | ICD-10-CM | POA: Diagnosis not present

## 2024-01-21 DIAGNOSIS — F32A Depression, unspecified: Secondary | ICD-10-CM | POA: Diagnosis not present

## 2024-01-21 DIAGNOSIS — E119 Type 2 diabetes mellitus without complications: Secondary | ICD-10-CM | POA: Diagnosis not present

## 2024-01-24 DIAGNOSIS — E785 Hyperlipidemia, unspecified: Secondary | ICD-10-CM | POA: Diagnosis not present

## 2024-01-24 DIAGNOSIS — I6521 Occlusion and stenosis of right carotid artery: Secondary | ICD-10-CM | POA: Diagnosis not present

## 2024-01-24 DIAGNOSIS — G8254 Quadriplegia, C5-C7 incomplete: Secondary | ICD-10-CM | POA: Diagnosis not present

## 2024-01-24 DIAGNOSIS — F32A Depression, unspecified: Secondary | ICD-10-CM | POA: Diagnosis not present

## 2024-01-24 DIAGNOSIS — K219 Gastro-esophageal reflux disease without esophagitis: Secondary | ICD-10-CM | POA: Diagnosis not present

## 2024-01-24 DIAGNOSIS — I739 Peripheral vascular disease, unspecified: Secondary | ICD-10-CM | POA: Diagnosis not present

## 2024-01-24 DIAGNOSIS — N319 Neuromuscular dysfunction of bladder, unspecified: Secondary | ICD-10-CM | POA: Diagnosis not present

## 2024-01-24 DIAGNOSIS — I779 Disorder of arteries and arterioles, unspecified: Secondary | ICD-10-CM | POA: Diagnosis not present

## 2024-01-24 DIAGNOSIS — R296 Repeated falls: Secondary | ICD-10-CM | POA: Diagnosis not present

## 2024-01-24 DIAGNOSIS — M5000 Cervical disc disorder with myelopathy, unspecified cervical region: Secondary | ICD-10-CM | POA: Diagnosis not present

## 2024-01-24 DIAGNOSIS — E119 Type 2 diabetes mellitus without complications: Secondary | ICD-10-CM | POA: Diagnosis not present

## 2024-01-24 DIAGNOSIS — F4323 Adjustment disorder with mixed anxiety and depressed mood: Secondary | ICD-10-CM | POA: Diagnosis not present

## 2024-01-25 DIAGNOSIS — N319 Neuromuscular dysfunction of bladder, unspecified: Secondary | ICD-10-CM | POA: Diagnosis not present

## 2024-01-25 DIAGNOSIS — I779 Disorder of arteries and arterioles, unspecified: Secondary | ICD-10-CM | POA: Diagnosis not present

## 2024-01-25 DIAGNOSIS — G8254 Quadriplegia, C5-C7 incomplete: Secondary | ICD-10-CM | POA: Diagnosis not present

## 2024-01-25 DIAGNOSIS — I6521 Occlusion and stenosis of right carotid artery: Secondary | ICD-10-CM | POA: Diagnosis not present

## 2024-01-25 DIAGNOSIS — M5000 Cervical disc disorder with myelopathy, unspecified cervical region: Secondary | ICD-10-CM | POA: Diagnosis not present

## 2024-01-25 DIAGNOSIS — F32A Depression, unspecified: Secondary | ICD-10-CM | POA: Diagnosis not present

## 2024-01-25 DIAGNOSIS — F4323 Adjustment disorder with mixed anxiety and depressed mood: Secondary | ICD-10-CM | POA: Diagnosis not present

## 2024-01-25 DIAGNOSIS — K219 Gastro-esophageal reflux disease without esophagitis: Secondary | ICD-10-CM | POA: Diagnosis not present

## 2024-01-25 DIAGNOSIS — E119 Type 2 diabetes mellitus without complications: Secondary | ICD-10-CM | POA: Diagnosis not present

## 2024-01-25 DIAGNOSIS — E785 Hyperlipidemia, unspecified: Secondary | ICD-10-CM | POA: Diagnosis not present

## 2024-01-25 DIAGNOSIS — I739 Peripheral vascular disease, unspecified: Secondary | ICD-10-CM | POA: Diagnosis not present

## 2024-01-25 DIAGNOSIS — R296 Repeated falls: Secondary | ICD-10-CM | POA: Diagnosis not present

## 2024-01-26 DIAGNOSIS — K219 Gastro-esophageal reflux disease without esophagitis: Secondary | ICD-10-CM | POA: Diagnosis not present

## 2024-01-26 DIAGNOSIS — M5000 Cervical disc disorder with myelopathy, unspecified cervical region: Secondary | ICD-10-CM | POA: Diagnosis not present

## 2024-01-26 DIAGNOSIS — N319 Neuromuscular dysfunction of bladder, unspecified: Secondary | ICD-10-CM | POA: Diagnosis not present

## 2024-01-26 DIAGNOSIS — G8254 Quadriplegia, C5-C7 incomplete: Secondary | ICD-10-CM | POA: Diagnosis not present

## 2024-01-26 DIAGNOSIS — R296 Repeated falls: Secondary | ICD-10-CM | POA: Diagnosis not present

## 2024-01-26 DIAGNOSIS — F32A Depression, unspecified: Secondary | ICD-10-CM | POA: Diagnosis not present

## 2024-01-26 DIAGNOSIS — I6521 Occlusion and stenosis of right carotid artery: Secondary | ICD-10-CM | POA: Diagnosis not present

## 2024-01-26 DIAGNOSIS — F4323 Adjustment disorder with mixed anxiety and depressed mood: Secondary | ICD-10-CM | POA: Diagnosis not present

## 2024-01-26 DIAGNOSIS — I779 Disorder of arteries and arterioles, unspecified: Secondary | ICD-10-CM | POA: Diagnosis not present

## 2024-01-26 DIAGNOSIS — I739 Peripheral vascular disease, unspecified: Secondary | ICD-10-CM | POA: Diagnosis not present

## 2024-01-26 DIAGNOSIS — E119 Type 2 diabetes mellitus without complications: Secondary | ICD-10-CM | POA: Diagnosis not present

## 2024-01-26 DIAGNOSIS — E785 Hyperlipidemia, unspecified: Secondary | ICD-10-CM | POA: Diagnosis not present

## 2024-01-27 ENCOUNTER — Ambulatory Visit (HOSPITAL_COMMUNITY)
Admission: RE | Admit: 2024-01-27 | Discharge: 2024-01-27 | Disposition: A | Discharge status: Home / Self Care | Source: Ambulatory Visit | Attending: Neurological Surgery | Admitting: Neurological Surgery

## 2024-01-27 DIAGNOSIS — I779 Disorder of arteries and arterioles, unspecified: Secondary | ICD-10-CM | POA: Diagnosis not present

## 2024-01-27 DIAGNOSIS — G8254 Quadriplegia, C5-C7 incomplete: Secondary | ICD-10-CM | POA: Diagnosis not present

## 2024-01-27 DIAGNOSIS — R296 Repeated falls: Secondary | ICD-10-CM | POA: Diagnosis not present

## 2024-01-27 DIAGNOSIS — I6521 Occlusion and stenosis of right carotid artery: Secondary | ICD-10-CM | POA: Diagnosis not present

## 2024-01-27 DIAGNOSIS — M4802 Spinal stenosis, cervical region: Secondary | ICD-10-CM | POA: Diagnosis not present

## 2024-01-27 DIAGNOSIS — I739 Peripheral vascular disease, unspecified: Secondary | ICD-10-CM | POA: Diagnosis not present

## 2024-01-27 DIAGNOSIS — E119 Type 2 diabetes mellitus without complications: Secondary | ICD-10-CM | POA: Diagnosis not present

## 2024-01-27 DIAGNOSIS — M5000 Cervical disc disorder with myelopathy, unspecified cervical region: Secondary | ICD-10-CM | POA: Diagnosis not present

## 2024-01-27 DIAGNOSIS — E785 Hyperlipidemia, unspecified: Secondary | ICD-10-CM | POA: Diagnosis not present

## 2024-01-27 DIAGNOSIS — K219 Gastro-esophageal reflux disease without esophagitis: Secondary | ICD-10-CM | POA: Diagnosis not present

## 2024-01-27 DIAGNOSIS — F4323 Adjustment disorder with mixed anxiety and depressed mood: Secondary | ICD-10-CM | POA: Diagnosis not present

## 2024-01-27 DIAGNOSIS — N319 Neuromuscular dysfunction of bladder, unspecified: Secondary | ICD-10-CM | POA: Diagnosis not present

## 2024-01-27 DIAGNOSIS — F32A Depression, unspecified: Secondary | ICD-10-CM | POA: Diagnosis not present

## 2024-01-28 DIAGNOSIS — M5000 Cervical disc disorder with myelopathy, unspecified cervical region: Secondary | ICD-10-CM | POA: Diagnosis not present

## 2024-01-28 DIAGNOSIS — K219 Gastro-esophageal reflux disease without esophagitis: Secondary | ICD-10-CM | POA: Diagnosis not present

## 2024-01-28 DIAGNOSIS — I779 Disorder of arteries and arterioles, unspecified: Secondary | ICD-10-CM | POA: Diagnosis not present

## 2024-01-28 DIAGNOSIS — E119 Type 2 diabetes mellitus without complications: Secondary | ICD-10-CM | POA: Diagnosis not present

## 2024-01-28 DIAGNOSIS — I6521 Occlusion and stenosis of right carotid artery: Secondary | ICD-10-CM | POA: Diagnosis not present

## 2024-01-28 DIAGNOSIS — F32A Depression, unspecified: Secondary | ICD-10-CM | POA: Diagnosis not present

## 2024-01-28 DIAGNOSIS — E785 Hyperlipidemia, unspecified: Secondary | ICD-10-CM | POA: Diagnosis not present

## 2024-01-28 DIAGNOSIS — G8254 Quadriplegia, C5-C7 incomplete: Secondary | ICD-10-CM | POA: Diagnosis not present

## 2024-01-28 DIAGNOSIS — F4323 Adjustment disorder with mixed anxiety and depressed mood: Secondary | ICD-10-CM | POA: Diagnosis not present

## 2024-01-28 DIAGNOSIS — I739 Peripheral vascular disease, unspecified: Secondary | ICD-10-CM | POA: Diagnosis not present

## 2024-01-28 DIAGNOSIS — N319 Neuromuscular dysfunction of bladder, unspecified: Secondary | ICD-10-CM | POA: Diagnosis not present

## 2024-01-28 DIAGNOSIS — R296 Repeated falls: Secondary | ICD-10-CM | POA: Diagnosis not present

## 2024-01-31 DIAGNOSIS — I739 Peripheral vascular disease, unspecified: Secondary | ICD-10-CM | POA: Diagnosis not present

## 2024-01-31 DIAGNOSIS — E119 Type 2 diabetes mellitus without complications: Secondary | ICD-10-CM | POA: Diagnosis not present

## 2024-01-31 DIAGNOSIS — I779 Disorder of arteries and arterioles, unspecified: Secondary | ICD-10-CM | POA: Diagnosis not present

## 2024-01-31 DIAGNOSIS — I6521 Occlusion and stenosis of right carotid artery: Secondary | ICD-10-CM | POA: Diagnosis not present

## 2024-01-31 DIAGNOSIS — K219 Gastro-esophageal reflux disease without esophagitis: Secondary | ICD-10-CM | POA: Diagnosis not present

## 2024-01-31 DIAGNOSIS — E785 Hyperlipidemia, unspecified: Secondary | ICD-10-CM | POA: Diagnosis not present

## 2024-01-31 DIAGNOSIS — F4323 Adjustment disorder with mixed anxiety and depressed mood: Secondary | ICD-10-CM | POA: Diagnosis not present

## 2024-01-31 DIAGNOSIS — R296 Repeated falls: Secondary | ICD-10-CM | POA: Diagnosis not present

## 2024-01-31 DIAGNOSIS — N319 Neuromuscular dysfunction of bladder, unspecified: Secondary | ICD-10-CM | POA: Diagnosis not present

## 2024-01-31 DIAGNOSIS — F32A Depression, unspecified: Secondary | ICD-10-CM | POA: Diagnosis not present

## 2024-01-31 DIAGNOSIS — G8254 Quadriplegia, C5-C7 incomplete: Secondary | ICD-10-CM | POA: Diagnosis not present

## 2024-01-31 DIAGNOSIS — M5000 Cervical disc disorder with myelopathy, unspecified cervical region: Secondary | ICD-10-CM | POA: Diagnosis not present

## 2024-02-01 DIAGNOSIS — E1122 Type 2 diabetes mellitus with diabetic chronic kidney disease: Secondary | ICD-10-CM | POA: Diagnosis not present

## 2024-02-01 DIAGNOSIS — L97421 Non-pressure chronic ulcer of left heel and midfoot limited to breakdown of skin: Secondary | ICD-10-CM | POA: Diagnosis not present

## 2024-02-01 DIAGNOSIS — L97418 Non-pressure chronic ulcer of right heel and midfoot with other specified severity: Secondary | ICD-10-CM | POA: Diagnosis not present

## 2024-02-01 DIAGNOSIS — L89150 Pressure ulcer of sacral region, unstageable: Secondary | ICD-10-CM | POA: Diagnosis not present

## 2024-02-02 DIAGNOSIS — F4323 Adjustment disorder with mixed anxiety and depressed mood: Secondary | ICD-10-CM | POA: Diagnosis not present

## 2024-02-02 DIAGNOSIS — E119 Type 2 diabetes mellitus without complications: Secondary | ICD-10-CM | POA: Diagnosis not present

## 2024-02-02 DIAGNOSIS — G8254 Quadriplegia, C5-C7 incomplete: Secondary | ICD-10-CM | POA: Diagnosis not present

## 2024-02-02 DIAGNOSIS — I779 Disorder of arteries and arterioles, unspecified: Secondary | ICD-10-CM | POA: Diagnosis not present

## 2024-02-02 DIAGNOSIS — M5000 Cervical disc disorder with myelopathy, unspecified cervical region: Secondary | ICD-10-CM | POA: Diagnosis not present

## 2024-02-02 DIAGNOSIS — I6521 Occlusion and stenosis of right carotid artery: Secondary | ICD-10-CM | POA: Diagnosis not present

## 2024-02-02 DIAGNOSIS — E785 Hyperlipidemia, unspecified: Secondary | ICD-10-CM | POA: Diagnosis not present

## 2024-02-02 DIAGNOSIS — I739 Peripheral vascular disease, unspecified: Secondary | ICD-10-CM | POA: Diagnosis not present

## 2024-02-02 DIAGNOSIS — R296 Repeated falls: Secondary | ICD-10-CM | POA: Diagnosis not present

## 2024-02-02 DIAGNOSIS — F32A Depression, unspecified: Secondary | ICD-10-CM | POA: Diagnosis not present

## 2024-02-02 DIAGNOSIS — K219 Gastro-esophageal reflux disease without esophagitis: Secondary | ICD-10-CM | POA: Diagnosis not present

## 2024-02-02 DIAGNOSIS — N319 Neuromuscular dysfunction of bladder, unspecified: Secondary | ICD-10-CM | POA: Diagnosis not present

## 2024-02-04 DIAGNOSIS — I739 Peripheral vascular disease, unspecified: Secondary | ICD-10-CM | POA: Diagnosis not present

## 2024-02-04 DIAGNOSIS — I6521 Occlusion and stenosis of right carotid artery: Secondary | ICD-10-CM | POA: Diagnosis not present

## 2024-02-04 DIAGNOSIS — F4323 Adjustment disorder with mixed anxiety and depressed mood: Secondary | ICD-10-CM | POA: Diagnosis not present

## 2024-02-04 DIAGNOSIS — M5000 Cervical disc disorder with myelopathy, unspecified cervical region: Secondary | ICD-10-CM | POA: Diagnosis not present

## 2024-02-04 DIAGNOSIS — R296 Repeated falls: Secondary | ICD-10-CM | POA: Diagnosis not present

## 2024-02-04 DIAGNOSIS — M48062 Spinal stenosis, lumbar region with neurogenic claudication: Secondary | ICD-10-CM | POA: Diagnosis not present

## 2024-02-04 DIAGNOSIS — I779 Disorder of arteries and arterioles, unspecified: Secondary | ICD-10-CM | POA: Diagnosis not present

## 2024-02-04 DIAGNOSIS — E119 Type 2 diabetes mellitus without complications: Secondary | ICD-10-CM | POA: Diagnosis not present

## 2024-02-04 DIAGNOSIS — G8254 Quadriplegia, C5-C7 incomplete: Secondary | ICD-10-CM | POA: Diagnosis not present

## 2024-02-04 DIAGNOSIS — E785 Hyperlipidemia, unspecified: Secondary | ICD-10-CM | POA: Diagnosis not present

## 2024-02-04 DIAGNOSIS — N319 Neuromuscular dysfunction of bladder, unspecified: Secondary | ICD-10-CM | POA: Diagnosis not present

## 2024-02-04 DIAGNOSIS — K219 Gastro-esophageal reflux disease without esophagitis: Secondary | ICD-10-CM | POA: Diagnosis not present

## 2024-02-04 DIAGNOSIS — F32A Depression, unspecified: Secondary | ICD-10-CM | POA: Diagnosis not present

## 2024-02-05 DIAGNOSIS — K219 Gastro-esophageal reflux disease without esophagitis: Secondary | ICD-10-CM | POA: Diagnosis not present

## 2024-02-05 DIAGNOSIS — I739 Peripheral vascular disease, unspecified: Secondary | ICD-10-CM | POA: Diagnosis not present

## 2024-02-05 DIAGNOSIS — R296 Repeated falls: Secondary | ICD-10-CM | POA: Diagnosis not present

## 2024-02-05 DIAGNOSIS — E785 Hyperlipidemia, unspecified: Secondary | ICD-10-CM | POA: Diagnosis not present

## 2024-02-05 DIAGNOSIS — G8254 Quadriplegia, C5-C7 incomplete: Secondary | ICD-10-CM | POA: Diagnosis not present

## 2024-02-05 DIAGNOSIS — E119 Type 2 diabetes mellitus without complications: Secondary | ICD-10-CM | POA: Diagnosis not present

## 2024-02-05 DIAGNOSIS — F4323 Adjustment disorder with mixed anxiety and depressed mood: Secondary | ICD-10-CM | POA: Diagnosis not present

## 2024-02-05 DIAGNOSIS — I779 Disorder of arteries and arterioles, unspecified: Secondary | ICD-10-CM | POA: Diagnosis not present

## 2024-02-05 DIAGNOSIS — I6521 Occlusion and stenosis of right carotid artery: Secondary | ICD-10-CM | POA: Diagnosis not present

## 2024-02-05 DIAGNOSIS — M5000 Cervical disc disorder with myelopathy, unspecified cervical region: Secondary | ICD-10-CM | POA: Diagnosis not present

## 2024-02-05 DIAGNOSIS — N319 Neuromuscular dysfunction of bladder, unspecified: Secondary | ICD-10-CM | POA: Diagnosis not present

## 2024-02-05 DIAGNOSIS — F32A Depression, unspecified: Secondary | ICD-10-CM | POA: Diagnosis not present

## 2024-02-06 ENCOUNTER — Other Ambulatory Visit: Payer: Self-pay

## 2024-02-06 ENCOUNTER — Inpatient Hospital Stay (HOSPITAL_COMMUNITY)
Admission: EM | Admit: 2024-02-06 | Discharge: 2024-02-13 | DRG: 871 | Disposition: A | Discharge status: Hospice | Source: Skilled Nursing Facility | Attending: Internal Medicine | Admitting: Internal Medicine

## 2024-02-06 ENCOUNTER — Encounter (HOSPITAL_COMMUNITY): Payer: Self-pay

## 2024-02-06 ENCOUNTER — Emergency Department (HOSPITAL_COMMUNITY)

## 2024-02-06 DIAGNOSIS — E785 Hyperlipidemia, unspecified: Secondary | ICD-10-CM | POA: Diagnosis present

## 2024-02-06 DIAGNOSIS — F4323 Adjustment disorder with mixed anxiety and depressed mood: Secondary | ICD-10-CM | POA: Diagnosis not present

## 2024-02-06 DIAGNOSIS — R6520 Severe sepsis without septic shock: Principal | ICD-10-CM | POA: Diagnosis present

## 2024-02-06 DIAGNOSIS — I6521 Occlusion and stenosis of right carotid artery: Secondary | ICD-10-CM | POA: Diagnosis not present

## 2024-02-06 DIAGNOSIS — R509 Fever, unspecified: Secondary | ICD-10-CM | POA: Diagnosis not present

## 2024-02-06 DIAGNOSIS — N289 Disorder of kidney and ureter, unspecified: Secondary | ICD-10-CM | POA: Diagnosis not present

## 2024-02-06 DIAGNOSIS — Z794 Long term (current) use of insulin: Secondary | ICD-10-CM | POA: Diagnosis not present

## 2024-02-06 DIAGNOSIS — N281 Cyst of kidney, acquired: Secondary | ICD-10-CM | POA: Diagnosis not present

## 2024-02-06 DIAGNOSIS — I70222 Atherosclerosis of native arteries of extremities with rest pain, left leg: Secondary | ICD-10-CM | POA: Diagnosis present

## 2024-02-06 DIAGNOSIS — L89156 Pressure-induced deep tissue damage of sacral region: Secondary | ICD-10-CM | POA: Diagnosis not present

## 2024-02-06 DIAGNOSIS — W19XXXD Unspecified fall, subsequent encounter: Secondary | ICD-10-CM | POA: Diagnosis present

## 2024-02-06 DIAGNOSIS — Z7401 Bed confinement status: Secondary | ICD-10-CM

## 2024-02-06 DIAGNOSIS — E119 Type 2 diabetes mellitus without complications: Secondary | ICD-10-CM

## 2024-02-06 DIAGNOSIS — R296 Repeated falls: Secondary | ICD-10-CM | POA: Diagnosis not present

## 2024-02-06 DIAGNOSIS — G8250 Quadriplegia, unspecified: Secondary | ICD-10-CM | POA: Diagnosis present

## 2024-02-06 DIAGNOSIS — R410 Disorientation, unspecified: Secondary | ICD-10-CM | POA: Diagnosis not present

## 2024-02-06 DIAGNOSIS — I7411 Embolism and thrombosis of thoracic aorta: Secondary | ICD-10-CM | POA: Diagnosis not present

## 2024-02-06 DIAGNOSIS — I10 Essential (primary) hypertension: Secondary | ICD-10-CM | POA: Diagnosis not present

## 2024-02-06 DIAGNOSIS — K219 Gastro-esophageal reflux disease without esophagitis: Secondary | ICD-10-CM | POA: Diagnosis not present

## 2024-02-06 DIAGNOSIS — S14109D Unspecified injury at unspecified level of cervical spinal cord, subsequent encounter: Secondary | ICD-10-CM | POA: Diagnosis not present

## 2024-02-06 DIAGNOSIS — I701 Atherosclerosis of renal artery: Secondary | ICD-10-CM | POA: Diagnosis not present

## 2024-02-06 DIAGNOSIS — N183 Chronic kidney disease, stage 3 unspecified: Secondary | ICD-10-CM | POA: Diagnosis not present

## 2024-02-06 DIAGNOSIS — I129 Hypertensive chronic kidney disease with stage 1 through stage 4 chronic kidney disease, or unspecified chronic kidney disease: Secondary | ICD-10-CM | POA: Diagnosis present

## 2024-02-06 DIAGNOSIS — A419 Sepsis, unspecified organism: Secondary | ICD-10-CM | POA: Diagnosis present

## 2024-02-06 DIAGNOSIS — I70223 Atherosclerosis of native arteries of extremities with rest pain, bilateral legs: Secondary | ICD-10-CM | POA: Diagnosis present

## 2024-02-06 DIAGNOSIS — L89610 Pressure ulcer of right heel, unstageable: Secondary | ICD-10-CM | POA: Diagnosis present

## 2024-02-06 DIAGNOSIS — E1151 Type 2 diabetes mellitus with diabetic peripheral angiopathy without gangrene: Secondary | ICD-10-CM | POA: Diagnosis present

## 2024-02-06 DIAGNOSIS — M5000 Cervical disc disorder with myelopathy, unspecified cervical region: Secondary | ICD-10-CM | POA: Diagnosis not present

## 2024-02-06 DIAGNOSIS — Z95820 Peripheral vascular angioplasty status with implants and grafts: Secondary | ICD-10-CM

## 2024-02-06 DIAGNOSIS — L97419 Non-pressure chronic ulcer of right heel and midfoot with unspecified severity: Secondary | ICD-10-CM | POA: Diagnosis not present

## 2024-02-06 DIAGNOSIS — Z7189 Other specified counseling: Secondary | ICD-10-CM | POA: Diagnosis not present

## 2024-02-06 DIAGNOSIS — Z1152 Encounter for screening for COVID-19: Secondary | ICD-10-CM | POA: Diagnosis not present

## 2024-02-06 DIAGNOSIS — R404 Transient alteration of awareness: Secondary | ICD-10-CM | POA: Diagnosis not present

## 2024-02-06 DIAGNOSIS — Z888 Allergy status to other drugs, medicaments and biological substances status: Secondary | ICD-10-CM

## 2024-02-06 DIAGNOSIS — L97429 Non-pressure chronic ulcer of left heel and midfoot with unspecified severity: Secondary | ICD-10-CM | POA: Diagnosis not present

## 2024-02-06 DIAGNOSIS — Z66 Do not resuscitate: Secondary | ICD-10-CM | POA: Diagnosis present

## 2024-02-06 DIAGNOSIS — R54 Age-related physical debility: Secondary | ICD-10-CM | POA: Diagnosis present

## 2024-02-06 DIAGNOSIS — T82868A Thrombosis of vascular prosthetic devices, implants and grafts, initial encounter: Secondary | ICD-10-CM | POA: Diagnosis not present

## 2024-02-06 DIAGNOSIS — S91302A Unspecified open wound, left foot, initial encounter: Secondary | ICD-10-CM | POA: Diagnosis not present

## 2024-02-06 DIAGNOSIS — Z886 Allergy status to analgesic agent status: Secondary | ICD-10-CM

## 2024-02-06 DIAGNOSIS — E43 Unspecified severe protein-calorie malnutrition: Secondary | ICD-10-CM | POA: Diagnosis not present

## 2024-02-06 DIAGNOSIS — J168 Pneumonia due to other specified infectious organisms: Secondary | ICD-10-CM | POA: Diagnosis not present

## 2024-02-06 DIAGNOSIS — T82858A Stenosis of vascular prosthetic devices, implants and grafts, initial encounter: Secondary | ICD-10-CM | POA: Diagnosis not present

## 2024-02-06 DIAGNOSIS — Z87891 Personal history of nicotine dependence: Secondary | ICD-10-CM

## 2024-02-06 DIAGNOSIS — A4159 Other Gram-negative sepsis: Secondary | ICD-10-CM | POA: Diagnosis present

## 2024-02-06 DIAGNOSIS — R319 Hematuria, unspecified: Secondary | ICD-10-CM | POA: Diagnosis not present

## 2024-02-06 DIAGNOSIS — Z806 Family history of leukemia: Secondary | ICD-10-CM

## 2024-02-06 DIAGNOSIS — N200 Calculus of kidney: Secondary | ICD-10-CM | POA: Diagnosis not present

## 2024-02-06 DIAGNOSIS — A411 Sepsis due to other specified staphylococcus: Secondary | ICD-10-CM | POA: Diagnosis not present

## 2024-02-06 DIAGNOSIS — D631 Anemia in chronic kidney disease: Secondary | ICD-10-CM | POA: Diagnosis present

## 2024-02-06 DIAGNOSIS — Z515 Encounter for palliative care: Secondary | ICD-10-CM

## 2024-02-06 DIAGNOSIS — J189 Pneumonia, unspecified organism: Principal | ICD-10-CM | POA: Diagnosis present

## 2024-02-06 DIAGNOSIS — Z811 Family history of alcohol abuse and dependence: Secondary | ICD-10-CM

## 2024-02-06 DIAGNOSIS — M2041 Other hammer toe(s) (acquired), right foot: Secondary | ICD-10-CM | POA: Diagnosis not present

## 2024-02-06 DIAGNOSIS — Z79899 Other long term (current) drug therapy: Secondary | ICD-10-CM

## 2024-02-06 DIAGNOSIS — S91301A Unspecified open wound, right foot, initial encounter: Secondary | ICD-10-CM | POA: Diagnosis not present

## 2024-02-06 DIAGNOSIS — M19071 Primary osteoarthritis, right ankle and foot: Secondary | ICD-10-CM | POA: Diagnosis not present

## 2024-02-06 DIAGNOSIS — L89619 Pressure ulcer of right heel, unspecified stage: Secondary | ICD-10-CM | POA: Diagnosis not present

## 2024-02-06 DIAGNOSIS — Z885 Allergy status to narcotic agent status: Secondary | ICD-10-CM

## 2024-02-06 DIAGNOSIS — N319 Neuromuscular dysfunction of bladder, unspecified: Secondary | ICD-10-CM | POA: Diagnosis not present

## 2024-02-06 DIAGNOSIS — I739 Peripheral vascular disease, unspecified: Secondary | ICD-10-CM | POA: Diagnosis present

## 2024-02-06 DIAGNOSIS — I452 Bifascicular block: Secondary | ICD-10-CM | POA: Diagnosis present

## 2024-02-06 DIAGNOSIS — R634 Abnormal weight loss: Secondary | ICD-10-CM | POA: Diagnosis not present

## 2024-02-06 DIAGNOSIS — Z7901 Long term (current) use of anticoagulants: Secondary | ICD-10-CM | POA: Diagnosis not present

## 2024-02-06 DIAGNOSIS — R4182 Altered mental status, unspecified: Secondary | ICD-10-CM | POA: Diagnosis not present

## 2024-02-06 DIAGNOSIS — N1831 Chronic kidney disease, stage 3a: Secondary | ICD-10-CM | POA: Diagnosis present

## 2024-02-06 DIAGNOSIS — J690 Pneumonitis due to inhalation of food and vomit: Secondary | ICD-10-CM | POA: Diagnosis present

## 2024-02-06 DIAGNOSIS — Z8619 Personal history of other infectious and parasitic diseases: Secondary | ICD-10-CM | POA: Diagnosis not present

## 2024-02-06 DIAGNOSIS — I70244 Atherosclerosis of native arteries of left leg with ulceration of heel and midfoot: Secondary | ICD-10-CM | POA: Diagnosis not present

## 2024-02-06 DIAGNOSIS — Y832 Surgical operation with anastomosis, bypass or graft as the cause of abnormal reaction of the patient, or of later complication, without mention of misadventure at the time of the procedure: Secondary | ICD-10-CM | POA: Diagnosis present

## 2024-02-06 DIAGNOSIS — L89620 Pressure ulcer of left heel, unstageable: Secondary | ICD-10-CM | POA: Diagnosis present

## 2024-02-06 DIAGNOSIS — E1122 Type 2 diabetes mellitus with diabetic chronic kidney disease: Secondary | ICD-10-CM | POA: Diagnosis present

## 2024-02-06 DIAGNOSIS — K7689 Other specified diseases of liver: Secondary | ICD-10-CM | POA: Diagnosis not present

## 2024-02-06 DIAGNOSIS — R918 Other nonspecific abnormal finding of lung field: Secondary | ICD-10-CM | POA: Diagnosis not present

## 2024-02-06 DIAGNOSIS — Z8249 Family history of ischemic heart disease and other diseases of the circulatory system: Secondary | ICD-10-CM

## 2024-02-06 DIAGNOSIS — Z96611 Presence of right artificial shoulder joint: Secondary | ICD-10-CM | POA: Diagnosis present

## 2024-02-06 DIAGNOSIS — Z800 Family history of malignant neoplasm of digestive organs: Secondary | ICD-10-CM

## 2024-02-06 DIAGNOSIS — I779 Disorder of arteries and arterioles, unspecified: Secondary | ICD-10-CM | POA: Diagnosis not present

## 2024-02-06 DIAGNOSIS — I70234 Atherosclerosis of native arteries of right leg with ulceration of heel and midfoot: Secondary | ICD-10-CM | POA: Diagnosis not present

## 2024-02-06 DIAGNOSIS — Z7984 Long term (current) use of oral hypoglycemic drugs: Secondary | ICD-10-CM

## 2024-02-06 DIAGNOSIS — Z860100 Personal history of colon polyps, unspecified: Secondary | ICD-10-CM

## 2024-02-06 DIAGNOSIS — F32A Depression, unspecified: Secondary | ICD-10-CM | POA: Diagnosis not present

## 2024-02-06 DIAGNOSIS — Z833 Family history of diabetes mellitus: Secondary | ICD-10-CM

## 2024-02-06 DIAGNOSIS — L89629 Pressure ulcer of left heel, unspecified stage: Secondary | ICD-10-CM | POA: Diagnosis not present

## 2024-02-06 DIAGNOSIS — R188 Other ascites: Secondary | ICD-10-CM | POA: Diagnosis not present

## 2024-02-06 DIAGNOSIS — Z91041 Radiographic dye allergy status: Secondary | ICD-10-CM

## 2024-02-06 DIAGNOSIS — G9341 Metabolic encephalopathy: Secondary | ICD-10-CM | POA: Diagnosis present

## 2024-02-06 DIAGNOSIS — Z8701 Personal history of pneumonia (recurrent): Secondary | ICD-10-CM | POA: Diagnosis not present

## 2024-02-06 DIAGNOSIS — G8254 Quadriplegia, C5-C7 incomplete: Secondary | ICD-10-CM | POA: Diagnosis not present

## 2024-02-06 DIAGNOSIS — R300 Dysuria: Secondary | ICD-10-CM | POA: Diagnosis present

## 2024-02-06 DIAGNOSIS — L89159 Pressure ulcer of sacral region, unspecified stage: Secondary | ICD-10-CM | POA: Diagnosis not present

## 2024-02-06 DIAGNOSIS — T82868D Thrombosis of vascular prosthetic devices, implants and grafts, subsequent encounter: Secondary | ICD-10-CM | POA: Diagnosis not present

## 2024-02-06 LAB — CBC WITH DIFFERENTIAL/PLATELET
Basophils Absolute: 0 K/uL (ref 0.0–0.1)
Basophils Relative: 0 %
Eosinophils Absolute: 0 K/uL (ref 0.0–0.5)
Eosinophils Relative: 0 %
HCT: 24.6 % — ABNORMAL LOW (ref 39.0–52.0)
Hemoglobin: 8.1 g/dL — ABNORMAL LOW (ref 13.0–17.0)
Lymphocytes Relative: 1 %
Lymphs Abs: 0.2 K/uL — ABNORMAL LOW (ref 0.7–4.0)
MCH: 29.1 pg (ref 26.0–34.0)
MCHC: 32.9 g/dL (ref 30.0–36.0)
MCV: 88.5 fL (ref 80.0–100.0)
Monocytes Absolute: 0 K/uL — ABNORMAL LOW (ref 0.1–1.0)
Monocytes Relative: 0 %
Neutro Abs: 22 K/uL — ABNORMAL HIGH (ref 1.7–7.7)
Neutrophils Relative %: 99 %
Platelets: 204 K/uL (ref 150–400)
RBC: 2.78 MIL/uL — ABNORMAL LOW (ref 4.22–5.81)
RDW: 14.1 % (ref 11.5–15.5)
WBC: 22.2 K/uL — ABNORMAL HIGH (ref 4.0–10.5)
nRBC: 0 % (ref 0.0–0.2)

## 2024-02-06 LAB — RESP PANEL BY RT-PCR (RSV, FLU A&B, COVID)  RVPGX2
Influenza A by PCR: NEGATIVE
Influenza B by PCR: NEGATIVE
Resp Syncytial Virus by PCR: NEGATIVE
SARS Coronavirus 2 by RT PCR: NEGATIVE

## 2024-02-06 LAB — COMPREHENSIVE METABOLIC PANEL WITH GFR
ALT: 24 U/L (ref 0–44)
AST: 58 U/L — ABNORMAL HIGH (ref 15–41)
Albumin: 2 g/dL — ABNORMAL LOW (ref 3.5–5.0)
Alkaline Phosphatase: 107 U/L (ref 38–126)
Anion gap: 12 (ref 5–15)
BUN: 52 mg/dL — ABNORMAL HIGH (ref 8–23)
CO2: 24 mmol/L (ref 22–32)
Calcium: 8 mg/dL — ABNORMAL LOW (ref 8.9–10.3)
Chloride: 98 mmol/L (ref 98–111)
Creatinine, Ser: 1.49 mg/dL — ABNORMAL HIGH (ref 0.61–1.24)
GFR, Estimated: 47 mL/min — ABNORMAL LOW (ref >60)
Glucose, Bld: 118 mg/dL — ABNORMAL HIGH (ref 70–99)
Potassium: 4.6 mmol/L (ref 3.5–5.1)
Sodium: 134 mmol/L — ABNORMAL LOW (ref 135–145)
Total Bilirubin: 0.6 mg/dL (ref 0.0–1.2)
Total Protein: 5.4 g/dL — ABNORMAL LOW (ref 6.5–8.1)

## 2024-02-06 LAB — I-STAT CG4 LACTIC ACID, ED
Lactic Acid, Venous: 3.4 mmol/L (ref 0.5–1.9)
Lactic Acid, Venous: 1.5 mmol/L (ref 0.5–1.9)

## 2024-02-06 LAB — PROTIME-INR
INR: 1.2 (ref 0.8–1.2)
Prothrombin Time: 15.9 s — ABNORMAL HIGH (ref 11.4–15.2)

## 2024-02-06 LAB — GLUCOSE, CAPILLARY: Glucose-Capillary: 135 mg/dL — ABNORMAL HIGH (ref 70–99)

## 2024-02-06 MED ORDER — TAMSULOSIN HCL 0.4 MG PO CAPS
0.4000 mg | ORAL_CAPSULE | Freq: Every day | ORAL | Status: DC
  Administered 2024-02-07 – 2024-02-09 (×6): 0.4 mg via ORAL
  Filled 2024-02-06 (×3): qty 1

## 2024-02-06 MED ORDER — FLUTICASONE PROPIONATE 50 MCG/ACT NA SUSP: 2.0000 | Freq: Every day | NASAL | Status: DC | PRN

## 2024-02-06 MED ORDER — DOCUSATE SODIUM 100 MG PO CAPS
200.0000 mg | ORAL_CAPSULE | Freq: Every day | ORAL | Status: DC
  Administered 2024-02-07 – 2024-02-10 (×7): 200 mg via ORAL
  Filled 2024-02-06 (×5): qty 2

## 2024-02-06 MED ORDER — ONDANSETRON HCL 4 MG/2ML IJ SOLN: 4.0000 mg | Freq: Four times a day (QID) | INTRAMUSCULAR | Status: DC | PRN

## 2024-02-06 MED ORDER — LACTATED RINGERS IV BOLUS (SEPSIS)
500.0000 mL | Freq: Once | INTRAVENOUS | Status: AC
  Administered 2024-02-06: 500 mL via INTRAVENOUS

## 2024-02-06 MED ORDER — TRAMADOL HCL 50 MG PO TABS
50.0000 mg | ORAL_TABLET | ORAL | Status: AC
  Administered 2024-02-06: 50 mg via ORAL
  Filled 2024-02-06: qty 1

## 2024-02-06 MED ORDER — SIMETHICONE 80 MG PO CHEW: 80.0000 mg | CHEWABLE_TABLET | Freq: Four times a day (QID) | ORAL | Status: DC | PRN

## 2024-02-06 MED ORDER — ENOXAPARIN SODIUM 30 MG/0.3ML IJ SOSY
30.0000 mg | PREFILLED_SYRINGE | Freq: Every day | INTRAMUSCULAR | Status: DC
  Administered 2024-02-07 (×2): 30 mg via SUBCUTANEOUS
  Filled 2024-02-06: qty 0.3

## 2024-02-06 MED ORDER — LACTATED RINGERS IV BOLUS (SEPSIS)
250.0000 mL | Freq: Once | INTRAVENOUS | Status: AC
  Administered 2024-02-06: 250 mL via INTRAVENOUS

## 2024-02-06 MED ORDER — DICLOFENAC SODIUM 1 % EX GEL
2.0000 g | Freq: Four times a day (QID) | CUTANEOUS | Status: DC
  Administered 2024-02-06 – 2024-02-10 (×29): 2 g via TOPICAL
  Filled 2024-02-06 (×2): qty 100

## 2024-02-06 MED ORDER — ADULT MULTIVITAMIN W/MINERALS CH
1.0000 | ORAL_TABLET | Freq: Every day | ORAL | Status: DC
  Administered 2024-02-07 – 2024-02-10 (×7): 1 via ORAL
  Filled 2024-02-06 (×4): qty 1

## 2024-02-06 MED ORDER — BISACODYL 10 MG RE SUPP
10.0000 mg | Freq: Every day | RECTAL | Status: DC
  Administered 2024-02-06 – 2024-02-10 (×6): 10 mg via RECTAL
  Filled 2024-02-06 (×5): qty 1

## 2024-02-06 MED ORDER — ACETAMINOPHEN 650 MG RE SUPP: 650.0000 mg | Freq: Four times a day (QID) | RECTAL | Status: DC | PRN

## 2024-02-06 MED ORDER — METOPROLOL SUCCINATE ER 25 MG PO TB24
12.5000 mg | ORAL_TABLET | Freq: Every day | ORAL | Status: DC
  Administered 2024-02-07 – 2024-02-10 (×7): 12.5 mg via ORAL
  Filled 2024-02-06 (×4): qty 1

## 2024-02-06 MED ORDER — INSULIN ASPART 100 UNIT/ML IJ SOLN
0.0000 [IU] | Freq: Every day | INTRAMUSCULAR | Status: DC
  Administered 2024-02-07 (×2): 3 [IU] via SUBCUTANEOUS

## 2024-02-06 MED ORDER — SODIUM CHLORIDE 0.9 % IV SOLN
2.0000 g | INTRAVENOUS | Status: DC
  Administered 2024-02-06: 2 g via INTRAVENOUS
  Filled 2024-02-06: qty 20

## 2024-02-06 MED ORDER — ACETAMINOPHEN 325 MG PO TABS
650.0000 mg | ORAL_TABLET | Freq: Four times a day (QID) | ORAL | Status: DC | PRN
  Administered 2024-02-08 – 2024-02-10 (×3): 650 mg via ORAL
  Filled 2024-02-06 (×2): qty 2

## 2024-02-06 MED ORDER — PREGABALIN 25 MG PO CAPS
50.0000 mg | ORAL_CAPSULE | Freq: Two times a day (BID) | ORAL | Status: DC
  Administered 2024-02-06 – 2024-02-10 (×15): 50 mg via ORAL
  Filled 2024-02-06 (×9): qty 2

## 2024-02-06 MED ORDER — LACTATED RINGERS IV SOLN: INTRAVENOUS | Status: AC

## 2024-02-06 MED ORDER — ACETAMINOPHEN 325 MG PO TABS: 325.0000 mg | ORAL_TABLET | ORAL | Status: DC | PRN

## 2024-02-06 MED ORDER — SENNOSIDES-DOCUSATE SODIUM 8.6-50 MG PO TABS
1.0000 | ORAL_TABLET | Freq: Two times a day (BID) | ORAL | Status: DC
  Administered 2024-02-06 – 2024-02-10 (×15): 1 via ORAL
  Filled 2024-02-06 (×10): qty 1

## 2024-02-06 MED ORDER — LACTATED RINGERS IV BOLUS
500.0000 mL | Freq: Once | INTRAVENOUS | Status: AC
  Administered 2024-02-06: 500 mL via INTRAVENOUS

## 2024-02-06 MED ORDER — SODIUM CHLORIDE 0.9 % IV SOLN
2.0000 g | Freq: Once | INTRAVENOUS | Status: AC
  Administered 2024-02-06: 2 g via INTRAVENOUS
  Filled 2024-02-06: qty 12.5

## 2024-02-06 MED ORDER — TRAMADOL HCL 50 MG PO TABS
25.0000 mg | ORAL_TABLET | Freq: Three times a day (TID) | ORAL | Status: DC | PRN
  Administered 2024-02-07 – 2024-02-08 (×4): 25 mg via ORAL
  Administered 2024-02-09: 50 mg via ORAL
  Administered 2024-02-09 – 2024-02-10 (×4): 25 mg via ORAL
  Filled 2024-02-06 (×5): qty 1

## 2024-02-06 MED ORDER — SODIUM CHLORIDE 0.9 % IV SOLN
500.0000 mg | INTRAVENOUS | Status: DC
  Administered 2024-02-06: 500 mg via INTRAVENOUS
  Filled 2024-02-06: qty 5

## 2024-02-06 MED ORDER — PROSOURCE PLUS PO LIQD
30.0000 mL | Freq: Every day | ORAL | Status: DC
  Administered 2024-02-07 – 2024-02-10 (×7): 30 mL via ORAL
  Filled 2024-02-06 (×4): qty 30

## 2024-02-06 MED ORDER — DULOXETINE HCL 20 MG PO CPEP
20.0000 mg | ORAL_CAPSULE | Freq: Every day | ORAL | Status: DC
  Administered 2024-02-07 – 2024-02-10 (×7): 20 mg via ORAL
  Filled 2024-02-06 (×4): qty 1

## 2024-02-06 MED ORDER — APIXABAN 2.5 MG PO TABS: 2.5000 mg | ORAL_TABLET | Freq: Two times a day (BID) | ORAL | Status: DC

## 2024-02-06 MED ORDER — LIDOCAINE 5 % EX PTCH
1.0000 | MEDICATED_PATCH | CUTANEOUS | Status: DC
  Administered 2024-02-07 – 2024-02-12 (×7): 1 via TRANSDERMAL
  Filled 2024-02-06 (×7): qty 1

## 2024-02-06 MED ORDER — FERROUS SULFATE 325 (65 FE) MG PO TABS
325.0000 mg | ORAL_TABLET | Freq: Every day | ORAL | Status: DC
  Administered 2024-02-07 – 2024-02-10 (×7): 325 mg via ORAL
  Filled 2024-02-06 (×4): qty 1

## 2024-02-06 MED ORDER — LACTATED RINGERS IV SOLN
150.0000 mL/h | INTRAVENOUS | Status: AC
  Administered 2024-02-06 – 2024-02-07 (×5): 150 mL/h via INTRAVENOUS

## 2024-02-06 MED ORDER — PROSOURCE PLUS PO LIQD: 30.0000 mL | Freq: Every day | ORAL | Status: DC

## 2024-02-06 MED ORDER — ONDANSETRON HCL 4 MG PO TABS
4.0000 mg | ORAL_TABLET | Freq: Four times a day (QID) | ORAL | Status: DC | PRN
Start: 2024-02-06 — End: 2024-02-13

## 2024-02-06 MED ORDER — VITAMIN C 500 MG PO TABS
500.0000 mg | ORAL_TABLET | Freq: Every day | ORAL | Status: DC
  Administered 2024-02-06 – 2024-02-09 (×7): 500 mg via ORAL
  Filled 2024-02-06 (×4): qty 1

## 2024-02-06 MED ORDER — INSULIN ASPART 100 UNIT/ML IJ SOLN
0.0000 [IU] | Freq: Three times a day (TID) | INTRAMUSCULAR | Status: DC
  Administered 2024-02-07: 3 [IU] via SUBCUTANEOUS
  Administered 2024-02-07 (×2): 5 [IU] via SUBCUTANEOUS
  Administered 2024-02-07 – 2024-02-09 (×7): 3 [IU] via SUBCUTANEOUS

## 2024-02-06 MED ORDER — VANCOMYCIN HCL IN DEXTROSE 1-5 GM/200ML-% IV SOLN
1000.0000 mg | Freq: Once | INTRAVENOUS | Status: AC
  Administered 2024-02-06: 1000 mg via INTRAVENOUS
  Filled 2024-02-06: qty 200

## 2024-02-06 MED ORDER — METRONIDAZOLE 500 MG/100ML IV SOLN
500.0000 mg | Freq: Once | INTRAVENOUS | Status: AC
  Administered 2024-02-06: 500 mg via INTRAVENOUS
  Filled 2024-02-06: qty 100

## 2024-02-06 MED ORDER — LACTATED RINGERS IV BOLUS (SEPSIS)
1000.0000 mL | Freq: Once | INTRAVENOUS | Status: AC
  Administered 2024-02-06: 1000 mL via INTRAVENOUS

## 2024-02-06 MED ORDER — MIDODRINE HCL 5 MG PO TABS
7.5000 mg | ORAL_TABLET | Freq: Three times a day (TID) | ORAL | Status: DC
  Administered 2024-02-07 – 2024-02-10 (×20): 7.5 mg via ORAL
  Filled 2024-02-06 (×11): qty 2

## 2024-02-06 MED ORDER — DANTROLENE SODIUM 100 MG PO CAPS
100.0000 mg | ORAL_CAPSULE | Freq: Two times a day (BID) | ORAL | Status: DC
  Administered 2024-02-06 – 2024-02-13 (×18): 100 mg via ORAL
  Filled 2024-02-06 (×15): qty 1

## 2024-02-06 MED ORDER — PANTOPRAZOLE SODIUM 40 MG PO TBEC
40.0000 mg | DELAYED_RELEASE_TABLET | Freq: Two times a day (BID) | ORAL | Status: DC
  Administered 2024-02-06 – 2024-02-10 (×15): 40 mg via ORAL
  Filled 2024-02-06 (×9): qty 1

## 2024-02-06 MED ORDER — ROSUVASTATIN CALCIUM 20 MG PO TABS
20.0000 mg | ORAL_TABLET | Freq: Every evening | ORAL | Status: DC
  Administered 2024-02-06 – 2024-02-09 (×7): 20 mg via ORAL
  Filled 2024-02-06 (×4): qty 1

## 2024-02-06 NOTE — ED Provider Notes (Signed)
 [place] EMERGENCY DEPARTMENT AT [facility] Provider Note   CSN: [id number] Arrival date & time: 02/06/24  1347     Patient presents with: No chief complaint on file.   Christian Bailey is a 83 y.o. male.   HPI 83 year old male presents via EMS as a code sepsis.  The patient has had a fever since yesterday according to the wife and EMS.  The wife reports it was up to 105 according to [facility].  Patient had complained of some dysuria at his catheter site a few days ago and his catheter was reportedly changed.  However yesterday the wife noticed him to be a little confused and his temperature was found to be high.  This morning he was more confused and his temperature still was high and he was sent into the ER.  He was given Tylenol  around 12:30 PM.  The patient has chronic wounds to his bilateral heels as well as a wound to his sacrum.  The patient does report some pain in his sacrum.  No current chest pain or shortness of breath.  Some chronic cough.  No headache.  At this point, when the wife is talking to the patient he seems more alert and like himself.  He was hypotensive with EMS with his blood pressure in the 80s and was given 1 L of LR.  Heart rate has remained tachycardic.  Prior to Admission medications   Medication Sig Start Date End Date Taking? Authorizing Provider  acetaminophen  (TYLENOL ) 325 MG tablet Take 1-2 tablets (325-650 mg total) by mouth every 4 (four) hours as needed for mild pain (pain score 1-3). 11/23/23   Love, Sharlet RAMAN, PA-C  apixaban  (ELIQUIS ) 2.5 MG TABS tablet Take 1 tablet (2.5 mg total) by mouth 2 (two) times daily. Continue thorough 01/19/24 to complete DVT prophylaxis course 11/23/23   Love, Sharlet RAMAN, PA-C  ascorbic acid  (VITAMIN C ) 500 MG tablet Take 1 tablet (500 mg total) by mouth at bedtime. 11/23/23   Love, Sharlet RAMAN, PA-C  bisacodyl  (DULCOLAX) 10 MG suppository Place 1 suppository (10 mg total) rectally daily. 11/24/23   Love, Sharlet RAMAN, PA-C   dantrolene  (DANTRIUM ) 100 MG capsule Take 1 capsule (100 mg total) by mouth 2 (two) times daily. 11/24/23   Love, Sharlet RAMAN, PA-C  diclofenac  Sodium (VOLTAREN ) 1 % GEL Apply 2 g topically 4 (four) times daily. 11/23/23   Love, Sharlet RAMAN, PA-C  docusate sodium  (COLACE) 100 MG capsule Take 2 capsules (200 mg total) by mouth daily. 11/24/23   Love, Sharlet RAMAN, PA-C  DULoxetine  (CYMBALTA ) 20 MG capsule Take 1 capsule (20 mg total) by mouth daily. 10/19/23   Lue Elsie BROCKS, MD  feeding supplement (ENSURE ENLIVE / ENSURE PLUS) LIQD Take 237 mLs by mouth daily. 11/23/23   Love, Sharlet RAMAN, PA-C  ferrous sulfate  325 (65 FE) MG tablet Take 1 tablet (325 mg total) by mouth at bedtime. 11/23/23   Love, Sharlet RAMAN, PA-C  fluticasone  (FLONASE ) 50 MCG/ACT nasal spray Place 2 sprays into both nostrils daily as needed for allergies or rhinitis. 11/23/23   Love, Sharlet RAMAN, PA-C  linagliptin  (TRADJENTA ) 5 MG TABS tablet Take 1 tablet (5 mg total) by mouth daily. 11/24/23   Love, Sharlet RAMAN, PA-C  melatonin 5 MG TABS Take 1 tablet (5 mg total) by mouth at bedtime as needed. 11/23/23   Love, Sharlet RAMAN, PA-C  menthol -cetylpyridinium (CEPACOL) 3 MG lozenge Take 1 lozenge (3 mg total) by mouth as needed for sore  throat. 10/19/23   Love, Sharlet RAMAN, PA-C  Menthol -Methyl Salicylate  (MUSCLE RUB) 10-15 % CREA Apply 1 Application topically as needed for muscle pain. To legs and arms 11/23/23   Love, Sharlet RAMAN, PA-C  metoprolol  succinate (TOPROL -XL) 25 MG 24 hr tablet Take 0.5 tablets (12.5 mg total) by mouth daily. 11/24/23   Love, Sharlet RAMAN, PA-C  midodrine  (PROAMATINE ) 2.5 MG tablet Take 3 tablets (7.5 mg total) by mouth 3 (three) times daily with meals. 11/23/23   Love, Sharlet RAMAN, PA-C  Multiple Vitamin (MULTIVITAMIN WITH MINERALS) TABS tablet Take 1 tablet by mouth daily. 11/09/23   Love, Sharlet RAMAN, PA-C  Nutritional Supplements (,FEEDING SUPPLEMENT, PROSOURCE PLUS) liquid Take 30 mLs by mouth daily. 11/24/23   Love, Sharlet RAMAN, PA-C  Nystatin  (GERHARDT'S BUTT CREAM) CREA Apply 1 Application topically 3 (three) times daily. 11/23/23   Love, Sharlet RAMAN, PA-C  pantoprazole  (PROTONIX ) 40 MG tablet Take 1 tablet (40 mg total) by mouth 2 (two) times daily. 09/10/22   Court Dorn PARAS, MD  pregabalin  (LYRICA ) 50 MG capsule Take 1 capsule (50 mg total) by mouth 2 (two) times daily. 10/18/23   Lue Elsie BROCKS, MD  rosuvastatin  (CRESTOR ) 20 MG tablet Take 20 mg by mouth every evening.     [provider]  senna-docusate (SENOKOT-S) 8.6-50 MG tablet Take 2 tablets by mouth daily with breakfast. 11/24/23   Love, Sharlet RAMAN, PA-C  simethicone  (MYLICON) 80 MG chewable tablet Chew 1 tablet (80 mg total) by mouth 4 (four) times daily as needed for flatulence (gas). 11/23/23   Love, Sharlet RAMAN, PA-C  tamsulosin  (FLOMAX ) 0.4 MG CAPS capsule Take 1 capsule (0.4 mg total) by mouth daily after supper. 11/23/23   Love, Sharlet RAMAN, PA-C    Allergies: Baclofen , Flexeril  [cyclobenzaprine ], Asa [aspirin ], Gadolinium derivatives, Iodinated contrast media, Pneumococcal 13-val conj vacc, Scopolamine , and Oxycodone -acetaminophen     Review of Systems  Constitutional:  Positive for fever.  Respiratory:  Negative for shortness of breath.   Cardiovascular:  Negative for chest pain.  Gastrointestinal:  Negative for abdominal pain.  Neurological:  Negative for headaches.  Psychiatric/Behavioral:  Positive for confusion.     Updated Vital Signs BP (!) 90/51 (BP Location: Right Arm)   Pulse (!) 131   Temp 98.5 F (36.9 C) (Oral)   Resp 19   Ht 5' 4 (1.626 m)   Wt 53.1 kg   SpO2 100%   BMI 20.08 kg/m   Physical Exam Vitals and nursing note reviewed.  Constitutional:      Appearance: He is well-developed. He is not diaphoretic.  HENT:     Head: Normocephalic and atraumatic.  Eyes:     Pupils: Pupils are equal, round, and reactive to light.  Cardiovascular:     Rate and Rhythm: Regular rhythm. Tachycardia present.     Heart sounds: Normal heart  sounds.  Pulmonary:     Effort: Pulmonary effort is normal.     Breath sounds: Normal breath sounds.  Abdominal:     General: There is no distension.     Palpations: Abdomen is soft.     Tenderness: There is no abdominal tenderness.  Skin:    General: Skin is warm and dry.     Comments: Patient has bilateral superficial heel wounds, the right has some yellowish drainage more than the left Patient has a small sacral wound that is superficial.  However there is some mild diffuse erythema and warmth across his sacrum.  No fluctuance.  Neurological:  Mental Status: He is alert.     (all labs ordered are listed, but only abnormal results are displayed) Labs Reviewed  RESP PANEL BY RT-PCR (RSV, FLU A&B, COVID)  [id number]  CULTURE, BLOOD (ROUTINE X 2)  CULTURE, BLOOD (ROUTINE X 2)  COMPREHENSIVE METABOLIC PANEL WITH GFR  CBC WITH DIFFERENTIAL/PLATELET  PROTIME-INR  URINALYSIS, W/ REFLEX TO CULTURE (INFECTION SUSPECTED)  I-STAT CG4 LACTIC ACID, ED    EKG: None  Radiology: No results found.   .Critical Care  Performed by: Freddi Hamilton, MD Authorized by: Freddi Hamilton, MD   Critical care provider statement:    Critical care time (minutes):  35   Critical care time was exclusive of:  Separately billable procedures and treating other patients   Critical care was necessary to treat or prevent imminent or life-threatening deterioration of the following conditions:  Sepsis and shock   Critical care was time spent personally by me on the following activities:  Development of treatment plan with patient or surrogate, discussions with consultants, evaluation of patient's response to treatment, examination of patient, ordering and review of laboratory studies, ordering and review of radiographic studies, ordering and performing treatments and interventions, pulse oximetry, re-evaluation of patient's condition and review of old charts    Medications Ordered in the ED  lactated  ringers  infusion (has no administration in time range)  lactated ringers  bolus 1,000 mL (has no administration in time range)    And  lactated ringers  bolus 500 mL (has no administration in time range)    And  lactated ringers  bolus 250 mL (has no administration in time range)  ceFEPIme  (MAXIPIME ) 2 g in sodium chloride  0.9 % 100 mL IVPB (has no administration in time range)  metroNIDAZOLE  (FLAGYL ) IVPB 500 mg (has no administration in time range)  vancomycin  (VANCOCIN ) IVPB 1000 mg/200 mL premix (has no administration in time range)                                   Medical Decision Making Amount and/or Complexity of Data Reviewed Labs: ordered. Decision-making details documented in ED Course. Radiology: ordered. Decision-making details documented in ED Course.  Risk Prescription drug management. Decision regarding hospitalization.   Patient presents with sepsis.  He was febrile at the nursing facility and was hypotensive with EMS.  He is neither here though does have some soft pressures in the 90s.  Was started on IV fluids and broad IV antibiotics.  He does have some wounds that could be potentially the source, especially the sacral wound, but also could be from his urine given he was recent complaining of dysuria in association with the catheter.  CTs will be obtained as well as x-rays.  Labs are currently pending though his lactate has come back at 3.4 and his white blood cell count is 22.  Care transferred to Dr. Yolande, will need admission after further workup.     Final diagnoses:  None    ED Discharge Orders     None          Freddi Hamilton, MD 02/06/24 1601

## 2024-02-06 NOTE — H&P (Signed)
 History and Physical    Patient: Christian Bailey FMW:[id number] DOB: 1941/01/09 DOA: 02/06/2024 DOS: the patient was seen and examined on 02/06/2024 PCP: Janey Santos, MD  Patient coming from: SNF  Chief Complaint: No chief complaint on file.  HPI: Christian Bailey is a 83 y.o. male with medical history significant of type 2 diabetes, essential hypertension, anemia of chronic disease, hyperlipidemia, osteoarthritis, peripheral arterial disease, right bundle branch block, status post multiple surgeries who is on Eliquis  from [place] presenting with weakness and shortness of breath.  Patient was noted to have altered mental status.  He is talking out of his head not his usual self.  He has a pressure ulcer on the sacrum and heels and they appear to have been worse with some foul-smelling discharge.  His temperature was noted to be 102.1.  He was given 650 mg of Tylenol  this morning at the facility.  EMS were called and they brought him over.  On the way they given a liter of LR.  Patient was found to be meeting sepsis criteria with his temperature, heart rate, white count.  Also findings of pneumonia.  At this point he is suspected to have sepsis as a result of pneumonia.  Also infected sacral and heel decubitus ulcers.  He is being admitted for further evaluation and treatment.  Review of Systems: As mentioned in the history of present illness. All other systems reviewed and are negative. Past Medical History:  Diagnosis Date   Anemia    Complication of anesthesia    Diabetes mellitus (HCC)    TYPE 2   GERD (gastroesophageal reflux disease)    History of blood transfusion    GI bleed   History of colon polyps    History of hiatal hernia    Hyperlipemia    Hypertension    Osteoarthritis    PAD (peripheral artery disease) (HCC)    a. stenting of his left common iliac artery >20 years ago. b. h/o LEIA stent and 2 stents to R SFA in 2011. c. 04/2014:  s/p PTA of right SFA for in-stent  restenosis, occluded left SFA   PONV (postoperative nausea and vomiting)    no porblem with the last 3 surgeries   RBBB (right bundle branch block with left anterior fascicular block)    NUCLEAR STRESS TEST, 08/18/2010 - no significant wall motion abnoramlities noted, post-stress EF 69%, normal myocardial perfusion study   Sinus tachycardia    a. Noted during admission 04/2014 but upon review seems to be frequent finding for patient.   Stenosis of carotid artery    a. 50% right carotid stenosis by angiogram 04/2014.   Past Surgical History:  Procedure Laterality Date   ABDOMINAL AORTOGRAM W/LOWER EXTREMITY N/A 01/27/2017   Procedure: Abdominal Aortogram w/Lower Extremity;  Surgeon: Serene Gaile ORN, MD;  Location: [facility];  Service: Cardiovascular;  Laterality: N/A;   ABDOMINAL AORTOGRAM W/LOWER EXTREMITY N/A 06/08/2017   Procedure: ABDOMINAL AORTOGRAM W/LOWER EXTREMITY;  Surgeon: Serene Gaile ORN, MD;  Location: [facility];  Service: Cardiovascular;  Laterality: N/A;   ABDOMINAL AORTOGRAM W/LOWER EXTREMITY N/A 08/31/2017   Procedure: ABDOMINAL AORTOGRAM W/LOWER EXTREMITY;  Surgeon: Serene Gaile ORN, MD;  Location: [facility];  Service: Cardiovascular;  Laterality: N/A;  rt. unilateral   ANGIOPLASTY / STENTING FEMORAL     ANGIOPLASTY / STENTING ILIAC     ANTERIOR CERVICAL CORPECTOMY N/A 10/12/2023   Procedure: ANTERIOR CERVICAL DECOMPRESSION VIA CORPECTOMY WITH RECONSTRUCTION INCLUDING ANTERIOR CERVICAL PLATING  CERVICAL FIVE-SIX,CERVICAL SIX-SEVEN;  Surgeon: Colon Shove, MD;  Location: [facility] OR;  Service: Neurosurgery;  Laterality: N/A;   AORTA - BILATERAL FEMORAL ARTERY BYPASS GRAFT N/A 07/06/2018   Procedure: AORTA BIFEMORAL BYPASS GRAFT USING 14X7MM X 40CM HEMASHIELD GOLD GRAFT;  Surgeon: Serene Gaile ORN, MD;  Location: [facility] OR;  Service: Vascular;  Laterality: N/A;   CEREBRAL ANGIOGRAM N/A 05/14/2014   Procedure: CEREBRAL ANGIOGRAM;  Surgeon: Dorn JINNY Lesches, MD;   Location: [facility] CATH LAB;  Service: Cardiovascular;  Laterality: N/A;   COLONOSCOPY W/ POLYPECTOMY     ENDARTERECTOMY Right 09/08/2017   ENDARTERECTOMY FEMORAL Right 09/08/2017   Procedure: REDO RIGHT FEMORAL ENDARTECTOMY WITH PATCH ANGIOPLASTY.;  Surgeon: Serene Gaile ORN, MD;  Location: [facility] OR;  Service: Vascular;  Laterality: Right;   EYE SURGERY Bilateral    cataract   FEMORAL ARTERY STENT Right 05/12/2010   Stented distally with a 6x100 Abbott absolute stent and proximally with a 6x60 Cook Zilver stent resulting in the reduction of the proximal segment 80% and mid segment 60-70% to 0% residual, LEFT common femoral artery stented with a 7x3 Smart stent resulting in reduction of 90% stenosis to 0% residual   FEMORAL-POPLITEAL BYPASS GRAFT Right 09/18/2016   Procedure: BYPASS GRAFT FEMORAL-POPLITEAL ARTERY;  Surgeon: Gaile ORN Serene, MD;  Location: [facility] OR;  Service: Vascular;  Laterality: Right;   FEMORAL-POPLITEAL BYPASS GRAFT Right 09/08/2017   Procedure: REDO BYPASS GRAFT FEMORAL-POPLITEAL ARTERY;  Surgeon: Serene Gaile ORN, MD;  Location: [facility];  Service: Vascular;  Laterality: Right;   FEMORAL-POPLITEAL BYPASS GRAFT Right 07/06/2018   Procedure: REVISION RIGHT FEMORAL TO POPLITEAL ARTERY BYPASS GRAFT;  Surgeon: Serene Gaile ORN, MD;  Location: [facility];  Service: Vascular;  Laterality: Right;   IR CHOLANGIOGRAM EXISTING TUBE  08/04/2018   IR EXCHANGE BILIARY DRAIN  01/22/2019   IR PERC CHOLECYSTOSTOMY  07/19/2018   IR RADIOLOGIST EVAL & MGMT  08/31/2018   IR RADIOLOGIST EVAL & MGMT  01/17/2019   IR RADIOLOGIST EVAL & MGMT  02/01/2019   IR RADIOLOGIST EVAL & MGMT  02/08/2019   KNEE ARTHROSCOPY     left   LOWER EXTREMITY ANGIOGRAM N/A 05/14/2014   Procedure: LOWER EXTREMITY ANGIOGRAM;  Surgeon: Dorn JINNY Lesches, MD;  Location: [facility] CATH LAB;  Service: Cardiovascular;  Laterality: N/A;   LOWER EXTREMITY ANGIOGRAPHY N/A 09/21/2016   Procedure: Lower Extremity Angiography;  Surgeon: Redell LITTIE Door, MD;  Location:  [facility] INVASIVE CV LAB;  Service: Cardiovascular;  Laterality: N/A;   PERIPHERAL VASCULAR ATHERECTOMY Right 01/27/2017   Procedure: PERIPHERAL VASCULAR ATHERECTOMY;  Surgeon: Serene Gaile ORN, MD;  [facility];  Service: Cardiovascular;  Laterality: Right;   PERIPHERAL VASCULAR BALLOON ANGIOPLASTY Right 06/08/2017   Procedure: PERIPHERAL VASCULAR BALLOON ANGIOPLASTY;  Surgeon: Serene Gaile ORN, MD;  [facility]: [facility];  Service: Cardiovascular;  Laterality: Right;  common femoral and superficial femoral arteries   PERIPHERAL VASCULAR CATHETERIZATION N/A 04/20/2016   Procedure: Lower Extremity Intervention;  Surgeon: Dorn JINNY Lesches, MD;  Location: [facility] INVASIVE CV LAB;  Service: Cardiovascular;  Laterality: N/A;   PERIPHERAL VASCULAR INTERVENTION  08/31/2017   Procedure: PERIPHERAL VASCULAR INTERVENTION;  Surgeon: Serene Gaile ORN, MD;  Location: [facility];  Service: Cardiovascular;;  REIA   REVERSE SHOULDER ARTHROPLASTY Right 12/07/2016   REVERSE SHOULDER ARTHROPLASTY Right 12/07/2016   Procedure: REVERSE SHOULDER ARTHROPLASTY;  Surgeon: Kay Kemps, MD;  Location: [facility] OR;  Service: Orthopedics;  Laterality: Right;   ROTATOR CUFF REPAIR Right 2003  SFA Right 05/14/2014   PTA  OF RT SFA         DR BERRY   Social History:  reports that he quit smoking about 35 years ago. He started smoking about 60 years ago. He has never used smokeless tobacco. He reports current alcohol  use. He reports that he does not use drugs.  Allergies  Allergen Reactions   Baclofen  Other (See Comments)    Severe delirium on Baclofen  and Flexeril    Flexeril  [Cyclobenzaprine ] Other (See Comments)    Confusion/delirium   Asa [Aspirin ] Other (See Comments)    GI bleeding   Gadolinium Derivatives Itching, Swelling and Other (See Comments)    Pt complained of face flushing/hottness and throat tightness/scratchiness immediately after the injections.  Within 4 minutes, all symptoms were gone and  the study was completed.  No further complications or signs of allergy were exhibited after completion of study.    Iodinated Contrast Media Other (See Comments)    Pt does not recall reaction   Pneumococcal 13-Val Conj Vacc     Other Reaction(s): achiness all over, dizziness, nausea, weakness   Scopolamine      Other Reaction(s): Delerium   Oxycodone -Acetaminophen  Other (See Comments)    Dizziness and feeling of being uncomfortable     Family History  Problem Relation Age of Onset   Heart disease Mother    Leukemia Mother    Stomach cancer Father    Esophageal cancer Brother    Liver disease Brother    Alcoholism Brother    Leukemia Brother    Leukemia Brother    Diabetes type II Brother    Lung disease Sister     Prior to Admission medications   Medication Sig Start Date End Date Taking? Authorizing Provider  acetaminophen  (TYLENOL ) 325 MG tablet Take 1-2 tablets (325-650 mg total) by mouth every 4 (four) hours as needed for mild pain (pain score 1-3). 11/23/23  Yes Love, Sharlet RAMAN, PA-C  ascorbic acid  (VITAMIN C ) 500 MG tablet Take 1 tablet (500 mg total) by mouth at bedtime. 11/23/23  Yes Love, Sharlet RAMAN, PA-C  bisacodyl  (DULCOLAX) 10 MG suppository Place 1 suppository (10 mg total) rectally daily. 11/24/23  Yes Love, Sharlet RAMAN, PA-C  cadexomer iodine  (IODOSORB) 0.9 % gel Apply 1 Application topically See admin instructions. Apply to right and left heel topically every Mon, Wed, and Fri for pressure wound.   Yes [provider]  dantrolene  (DANTRIUM ) 100 MG capsule Take 1 capsule (100 mg total) by mouth 2 (two) times daily. 11/24/23  Yes Love, Sharlet RAMAN, PA-C  diclofenac  Sodium (VOLTAREN ) 1 % GEL Apply 2 g topically 4 (four) times daily. 11/23/23  Yes Love, Sharlet RAMAN, PA-C  docusate sodium  (COLACE) 100 MG capsule Take 2 capsules (200 mg total) by mouth daily. 11/24/23  Yes Love, Sharlet RAMAN, PA-C  DULoxetine  (CYMBALTA ) 20 MG capsule Take 1 capsule (20 mg total) by mouth daily. 10/19/23   Yes Lue Elsie BROCKS, MD  ferrous sulfate  325 (65 FE) MG tablet Take 1 tablet (325 mg total) by mouth at bedtime. Patient taking differently: Take 325 mg by mouth daily with breakfast. 11/23/23  Yes Love, Pamela S, PA-C  fluticasone  (FLONASE ) 50 MCG/ACT nasal spray Place 2 sprays into both nostrils daily as needed for allergies or rhinitis. 11/23/23  Yes Love, Sharlet RAMAN, PA-C  lidocaine  4 % Place 1 patch onto the skin daily.   Yes [provider]  linagliptin  (TRADJENTA ) 5 MG TABS tablet Take 1 tablet (  5 mg total) by mouth daily. 11/24/23  Yes Love, Sharlet RAMAN, PA-C  menthol -cetylpyridinium (CEPACOL) 3 MG lozenge Take 1 lozenge (3 mg total) by mouth as needed for sore throat. Patient taking differently: Take 1 lozenge by mouth daily as needed for sore throat. 10/19/23  Yes Love, Sharlet RAMAN, PA-C  Menthol -Methyl Salicylate  (MUSCLE RUB) 10-15 % CREA Apply 1 Application topically as needed for muscle pain. To legs and arms 11/23/23  Yes Love, Sharlet RAMAN, PA-C  metoprolol  succinate (TOPROL -XL) 25 MG 24 hr tablet Take 0.5 tablets (12.5 mg total) by mouth daily. 11/24/23  Yes Love, Sharlet RAMAN, PA-C  midodrine  (PROAMATINE ) 2.5 MG tablet Take 3 tablets (7.5 mg total) by mouth 3 (three) times daily with meals. Patient taking differently: Take 7.5 mg by mouth 3 (three) times daily with meals. Hold for SBP >130 11/23/23  Yes Love, Sharlet RAMAN, PA-C  Multiple Vitamin (MULTIVITAMIN WITH MINERALS) TABS tablet Take 1 tablet by mouth daily. 11/09/23  Yes Love, Sharlet RAMAN, PA-C  NON FORMULARY Take 150 mLs by mouth 3 (three) times daily. House suopplement   Yes [provider]  Nutritional Supplements (,FEEDING SUPPLEMENT, PROSOURCE PLUS) liquid Take 30 mLs by mouth daily. 11/24/23  Yes Love, Sharlet RAMAN, PA-C  Nutritional Supplements (,FEEDING SUPPLEMENT, PROSOURCE PLUS) liquid Take 30 mLs by mouth daily.   Yes [provider]  pantoprazole  (PROTONIX ) 40 MG tablet Take 1 tablet (40 mg total) by mouth 2 (two)  times daily. 09/10/22  Yes Court Dorn PARAS, MD  pregabalin  (LYRICA ) 50 MG capsule Take 1 capsule (50 mg total) by mouth 2 (two) times daily. 10/18/23  Yes Lue Elsie BROCKS, MD  rosuvastatin  (CRESTOR ) 20 MG tablet Take 20 mg by mouth every evening.    Yes [provider]  senna-docusate (SENOKOT-S) 8.6-50 MG tablet Take 2 tablets by mouth daily with breakfast. Patient taking differently: Take 1 tablet by mouth 2 (two) times daily. 11/24/23  Yes Love, Sharlet RAMAN, PA-C  simethicone  (MYLICON) 80 MG chewable tablet Chew 1 tablet (80 mg total) by mouth 4 (four) times daily as needed for flatulence (gas). Patient taking differently: Chew 80 mg by mouth every 6 (six) hours as needed for flatulence (gas). 11/23/23  Yes Love, Sharlet RAMAN, PA-C  tamsulosin  (FLOMAX ) 0.4 MG CAPS capsule Take 1 capsule (0.4 mg total) by mouth daily after supper. 11/23/23  Yes Love, Sharlet RAMAN, PA-C  traMADol  (ULTRAM ) 50 MG tablet Take 25 mg by mouth every 8 (eight) hours as needed for severe pain (pain score 7-10).   Yes [provider]  apixaban  (ELIQUIS ) 2.5 MG TABS tablet Take 1 tablet (2.5 mg total) by mouth 2 (two) times daily. Continue thorough 01/19/24 to complete DVT prophylaxis course Patient not taking: Reported on 02/06/2024 11/23/23   Love, Sharlet RAMAN, PA-C  feeding supplement (ENSURE ENLIVE / ENSURE PLUS) LIQD Take 237 mLs by mouth daily. Patient not taking: Reported on 02/06/2024 11/23/23   Love, Sharlet RAMAN, PA-C  melatonin 5 MG TABS Take 1 tablet (5 mg total) by mouth at bedtime as needed. Patient not taking: Reported on 02/06/2024 11/23/23   Love, Sharlet RAMAN, PA-C  Nystatin (GERHARDT'S BUTT CREAM) CREA Apply 1 Application topically 3 (three) times daily. Patient not taking: Reported on 02/06/2024 11/23/23   Maurice Sharlet RAMAN DEVONNA    Physical Exam: Vitals:   02/06/24 1515 02/06/24 1600 02/06/24 1700 02/06/24 1715  BP: (!) 100/57 (!) 105/50 (!) 119/58 (!) 96/59  Pulse: (!) 121 (!) 122 (!) 120 (!) 122  Resp: 17 17  16  (!) 23  Temp:      TempSrc:      SpO2: 100% 99% 99% 100%  Weight:      Height:       Constitutional:  chronically ill looking, slightly confused calm, comfortable Eyes: PERRL, lids and conjunctivae normal ENMT: Mucous membranes are dry posterior pharynx clear of any exudate or lesions.Normal dentition.  Neck: normal, supple, no masses, no thyromegaly Respiratory: clear to auscultation bilaterally, no wheezing, no crackles. Normal respiratory effort. No accessory muscle use.  Cardiovascular: Sinus tachycardia, no murmurs / rubs / gallops. No extremity edema. 2+ pedal pulses. No carotid bruits.  Abdomen: no tenderness, no masses palpated. No hepatosplenomegaly. Bowel sounds positive.  Musculoskeletal: Good range of motion, no joint swelling or tenderness, Skin: Stage II sacral decubitus ulcer, mild drainage Neurologic: CN 2-12 grossly intact. Sensation intact, DTR normal. Strength 5/5 in all 4.  Psychiatric: Normal judgment and insight. Alert and oriented x 3. Normal mood  Data Reviewed:  Temperature 102.1, blood pressure 90/51, pulse 131, white count 22.2 hemoglobin 8.1, sodium 134, BUN 52 creatinine 1.49 calcium  8.0.  Lactic acid 3.4.  Acute viral screen negative for COVID.  Also negative for viral's like influenza and flu CT abdomen pelvis showed ended bronchial debris in the left lower lobe indicated aspiration some tiny right renal stone granular stone debris lays of the right posterolateral bladder.  Foley catheter is in place but in the posterior urethra x-ray of the left foot showed no evidence of osteomyelitis at the site of wound is not seen on x-ray x-ray of the right foot also shows thinning of the soft tissues posterior to the calcaneus but no osteomyelitis  Assessment and Plan:  #1 severe sepsis secondary to pneumonia: Patient will be admitted.  Will start empiric antibiotics.  Blood cultures obtained.  Will follow results and adjust antibiotics as necessary.  #2 diabetes:  Type II.  Initiate sliding scale insulin .  Continue close monitoring.  #3 multiple decubitus ulcers: Involving the sacrum and the bilateral heels.  Patient will be on antibiotics.  Wound care will be consulted  #3 neurogenic bladder: Patient has indwelling catheter.  It appears to be placed in the wrong position.  We will replace his Foley catheter.  #5 essential hypertension: Continue with home regimen  #6 peripheral vascular disease: Will continue home regimen.  #7 hyperlipidemia: Continue statin  #8 chronic kidney disease stage III: Appears to be at baseline.  Continue to monitor    Advance Care Planning:   Code Status: Prior limited DNR  Consults: None  Family Communication: Wife at baseline  Severity of Illness: The appropriate patient status for this patient is INPATIENT. Inpatient status is judged to be reasonable and necessary in order to provide the required intensity of service to ensure the patient's safety. The patient's presenting symptoms, physical exam findings, and initial radiographic and laboratory data in the context of their chronic comorbidities is felt to place them at high risk for further clinical deterioration. Furthermore, it is not anticipated that the patient will be medically stable for discharge from the hospital within 2 midnights of admission.   * I certify that at the point of admission it is my clinical judgment that the patient will require inpatient hospital care spanning beyond 2 midnights from the point of admission due to high intensity of service, high risk for further deterioration and high frequency of surveillance required.*  AuthorBETHA SIM KNOLL, MD 02/06/2024 6:22 PM  For on call review  [email or web address]

## 2024-02-06 NOTE — ED Provider Notes (Signed)
  Physical Exam  BP (!) 100/57   Pulse (!) 121   Temp 98.5 F (36.9 C) (Oral)   Resp 17   Ht 5' 4 (1.626 m)   Wt 53.1 kg   SpO2 100%   BMI 20.08 kg/m   Physical Exam  Procedures  Procedures  ED Course / MDM   Clinical Course as of 02/07/24 0056  Sun Feb 06, 2024  1536 Assumed care from Dr. Freddi. 83 yo M sepsis wo clear source. Altred since yesterday. Quadriplegia from old spinal injury. Has a foley that was changed a few days ago and has dysuria. Has multiple pressure wounds. Soft pressures with tachycardia. Lactic acid was 3.4. Had fever prior to arrival. Scanning abdomen pelvis for his sacral wounds. Changing foley and getting new UA. Got cefepime , flagyl , and vanco with 30ml/kg fluid bolus.  Also getting extremity x-rays to evaluate for osteo-.   [RP]  1538 CT ABDOMEN PELVIS WO CONTRAST Possible aspiration pna and foley in posterior urethra.  [RP]  1538 CT Head Wo Contrast No acute findings.  [RP]  1547 Creatinine(!): 1.49 At baseline [RP]  1553 DG Chest Port 1 View Possible right-sided pneumonia [RP]  1844 Discussed with Dr. Sim for admission.  Patient's repeat lactate is improved.  Blood pressure remains soft but has not required pressors.  Given additional 500 mL bolus.  Suspect that the source of his sepsis is likely due to his pneumonia. [RP]    Clinical Course User Index [RP] Yolande Lamar BROCKS, MD   Medical Decision Making Amount and/or Complexity of Data Reviewed Labs: ordered. Decision-making details documented in ED Course. Radiology: ordered. Decision-making details documented in ED Course.  Risk Prescription drug management. Decision regarding hospitalization.      Yolande Lamar BROCKS, MD 02/07/24 [phone]

## 2024-02-06 NOTE — Sepsis Progress Note (Signed)
 Elink folloing code sepsis

## 2024-02-06 NOTE — ED Triage Notes (Signed)
 PT BIB [facility] from [facility], per report AMS since yesterday. Pressure wound on sacrum and heels mentioned per wife. Staff states 102.1 temperature. Received 650mg  tylenol  this morning at facility, received 1L LR en route with EMS. Aox4 baseline, having some delays with answering questions but alert and oriented on arrival.   EMS VS:  HR 130, RR 32, 98% RA, CBG 160, BP 100/60 T 99.1

## 2024-02-07 ENCOUNTER — Inpatient Hospital Stay (HOSPITAL_COMMUNITY)

## 2024-02-07 ENCOUNTER — Encounter (HOSPITAL_COMMUNITY)

## 2024-02-07 DIAGNOSIS — J189 Pneumonia, unspecified organism: Secondary | ICD-10-CM

## 2024-02-07 DIAGNOSIS — I129 Hypertensive chronic kidney disease with stage 1 through stage 4 chronic kidney disease, or unspecified chronic kidney disease: Secondary | ICD-10-CM

## 2024-02-07 DIAGNOSIS — Z87891 Personal history of nicotine dependence: Secondary | ICD-10-CM

## 2024-02-07 DIAGNOSIS — L97429 Non-pressure chronic ulcer of left heel and midfoot with unspecified severity: Secondary | ICD-10-CM

## 2024-02-07 DIAGNOSIS — T82858A Stenosis of vascular prosthetic devices, implants and grafts, initial encounter: Secondary | ICD-10-CM

## 2024-02-07 DIAGNOSIS — Z95828 Presence of other vascular implants and grafts: Secondary | ICD-10-CM

## 2024-02-07 DIAGNOSIS — E785 Hyperlipidemia, unspecified: Secondary | ICD-10-CM

## 2024-02-07 DIAGNOSIS — N183 Chronic kidney disease, stage 3 unspecified: Secondary | ICD-10-CM

## 2024-02-07 DIAGNOSIS — E1122 Type 2 diabetes mellitus with diabetic chronic kidney disease: Secondary | ICD-10-CM

## 2024-02-07 DIAGNOSIS — E1151 Type 2 diabetes mellitus with diabetic peripheral angiopathy without gangrene: Secondary | ICD-10-CM

## 2024-02-07 DIAGNOSIS — A419 Sepsis, unspecified organism: Secondary | ICD-10-CM

## 2024-02-07 DIAGNOSIS — I70234 Atherosclerosis of native arteries of right leg with ulceration of heel and midfoot: Secondary | ICD-10-CM

## 2024-02-07 DIAGNOSIS — I70244 Atherosclerosis of native arteries of left leg with ulceration of heel and midfoot: Secondary | ICD-10-CM

## 2024-02-07 DIAGNOSIS — L89159 Pressure ulcer of sacral region, unspecified stage: Secondary | ICD-10-CM

## 2024-02-07 DIAGNOSIS — L97419 Non-pressure chronic ulcer of right heel and midfoot with unspecified severity: Secondary | ICD-10-CM

## 2024-02-07 LAB — CBC
HCT: 26.1 % — ABNORMAL LOW (ref 39.0–52.0)
Hemoglobin: 8.6 g/dL — ABNORMAL LOW (ref 13.0–17.0)
MCH: 28.3 pg (ref 26.0–34.0)
MCHC: 33 g/dL (ref 30.0–36.0)
MCV: 85.9 fL (ref 80.0–100.0)
Platelets: 174 K/uL (ref 150–400)
RBC: 3.04 MIL/uL — ABNORMAL LOW (ref 4.22–5.81)
RDW: 14.5 % (ref 11.5–15.5)
WBC: 22.3 K/uL — ABNORMAL HIGH (ref 4.0–10.5)
nRBC: 0 % (ref 0.0–0.2)

## 2024-02-07 LAB — URINALYSIS, W/ REFLEX TO CULTURE (INFECTION SUSPECTED)
Bilirubin Urine: NEGATIVE
Glucose, UA: 50 mg/dL — AB
Ketones, ur: NEGATIVE mg/dL
Nitrite: NEGATIVE
Protein, ur: 100 mg/dL — AB
RBC / HPF: >50 RBC/hpf (ref 0–5)
Specific Gravity, Urine: 1.009 (ref 1.005–1.030)
WBC, UA: >50 WBC/hpf (ref 0–5)
pH: 6 (ref 5.0–8.0)

## 2024-02-07 LAB — BLOOD CULTURE ID PANEL (REFLEXED) - BCID2

## 2024-02-07 LAB — COMPREHENSIVE METABOLIC PANEL WITH GFR
ALT: 34 U/L (ref 0–44)
AST: 116 U/L — ABNORMAL HIGH (ref 15–41)
Albumin: 1.8 g/dL — ABNORMAL LOW (ref 3.5–5.0)
Alkaline Phosphatase: 68 U/L (ref 38–126)
Anion gap: 9 (ref 5–15)
BUN: 39 mg/dL — ABNORMAL HIGH (ref 8–23)
CO2: 26 mmol/L (ref 22–32)
Calcium: 7.8 mg/dL — ABNORMAL LOW (ref 8.9–10.3)
Chloride: 101 mmol/L (ref 98–111)
Creatinine, Ser: 1.14 mg/dL (ref 0.61–1.24)
GFR, Estimated: >60 mL/min (ref >60)
Glucose, Bld: 116 mg/dL — ABNORMAL HIGH (ref 70–99)
Potassium: 4.3 mmol/L (ref 3.5–5.1)
Sodium: 136 mmol/L (ref 135–145)
Total Bilirubin: 0.8 mg/dL (ref 0.0–1.2)
Total Protein: 5.1 g/dL — ABNORMAL LOW (ref 6.5–8.1)

## 2024-02-07 LAB — GLUCOSE, CAPILLARY
Glucose-Capillary: 108 mg/dL — ABNORMAL HIGH (ref 70–99)
Glucose-Capillary: 178 mg/dL — ABNORMAL HIGH (ref 70–99)
Glucose-Capillary: 209 mg/dL — ABNORMAL HIGH (ref 70–99)
Glucose-Capillary: 190 mg/dL — ABNORMAL HIGH (ref 70–99)

## 2024-02-07 LAB — PROTIME-INR
INR: 1.2 (ref 0.8–1.2)
Prothrombin Time: 15.4 s — ABNORMAL HIGH (ref 11.4–15.2)

## 2024-02-07 LAB — CORTISOL-AM, BLOOD: Cortisol - AM: 32.9 ug/dL — ABNORMAL HIGH (ref 6.7–22.6)

## 2024-02-07 MED ORDER — DIPHENHYDRAMINE HCL 25 MG PO CAPS
50.0000 mg | ORAL_CAPSULE | Freq: Once | ORAL | Status: AC
  Administered 2024-02-07 (×2): 50 mg via ORAL
  Filled 2024-02-07: qty 2

## 2024-02-07 MED ORDER — IOHEXOL 350 MG/ML SOLN
200.0000 mL | Freq: Once | INTRAVENOUS | Status: AC | PRN
  Administered 2024-02-07 (×2): 200 mL via INTRAVENOUS

## 2024-02-07 MED ORDER — LACTATED RINGERS IV BOLUS
1000.0000 mL | Freq: Once | INTRAVENOUS | Status: AC
  Administered 2024-02-07 (×2): 1000 mL via INTRAVENOUS

## 2024-02-07 MED ORDER — SODIUM CHLORIDE 0.9 % IV SOLN
2.0000 g | INTRAVENOUS | Status: DC
  Administered 2024-02-07 – 2024-02-08 (×4): 2 g via INTRAVENOUS
  Filled 2024-02-07 (×2): qty 20

## 2024-02-07 MED ORDER — DIPHENHYDRAMINE HCL 50 MG/ML IJ SOLN: 50.0000 mg | Freq: Once | INTRAMUSCULAR | Status: AC

## 2024-02-07 MED ORDER — METHYLPREDNISOLONE SODIUM SUCC 40 MG IJ SOLR
40.0000 mg | Freq: Once | INTRAMUSCULAR | Status: AC
  Administered 2024-02-07 (×2): 40 mg via INTRAVENOUS
  Filled 2024-02-07: qty 1

## 2024-02-07 MED ORDER — ENOXAPARIN SODIUM 40 MG/0.4ML IJ SOSY
40.0000 mg | PREFILLED_SYRINGE | Freq: Every day | INTRAMUSCULAR | Status: DC
  Administered 2024-02-08 – 2024-02-10 (×5): 40 mg via SUBCUTANEOUS
  Filled 2024-02-07 (×3): qty 0.4

## 2024-02-07 MED ORDER — CHLORHEXIDINE GLUCONATE CLOTH 2 % EX PADS
6.0000 | MEDICATED_PAD | Freq: Every day | CUTANEOUS | Status: DC
  Administered 2024-02-07 – 2024-02-10 (×7): 6 via TOPICAL

## 2024-02-07 NOTE — Consult Note (Addendum)
 Hospital Consult    Reason for Consult:  PAD Requesting Physician:  Dr. Trixie MRN #:  [id number]  History of Present Illness: This is a 83 y.o. male with PMH significant for HTN, HLD, poorly controlled Type II DM, CKD III and PAD who was admitted yesterday from [facility] with AMS and SOB. He was found to be septic with PNA as well as bilateral heel ulcerations and sacral decubitus ulcers. Vascular surgery was consulted for evaluation of his PAD and bilateral heel wounds. He is well known to our practice. He unfortunately missed his most recent follow up in our office in June. He has extensive vascular surgery history. He initially underwent right femoral-popliteal bypass graft with vein on 09/18/2016 for claudication. He has since undergone several percutaneous as well as surgical revisions of his proximal bypass. Most recently on 07/06/2018 he underwent an aortobifemoral bypass graft including revision of the proximal portion of his right femoropopliteal bypass with an interposition Gore-Tex. Unfortunately on non invasive imagine in November of 2024 his RLE bypass was noted to be occluded.   Patient's wife provides most of history as patient was in a lot of discomfort. She explains that following several falls earlier this year he experienced a cervical spinal cord injury with resulting in quadriplegia and as been unable to walk. He underwent back surgery for attempted decompression in April but minimal improvement since. He was initially in CIR, but then ended up at SNF at [facility]. She says he unfortunately has progressively worsened since his admission there as far as his mobility. She reports that she cancelled his appointment in our office in June because he was in SNF and she did not feel at the time that he needed his appointment. She is unsure exactly how long the pressure wounds have been on his heels, but likely over 1 month. She explains that normally his feet have been cool to the touch  but now they are ice cold. He is unable to feel or move them. Uncertain of when this started.   Past Medical History:  Diagnosis Date   Anemia    Complication of anesthesia    Diabetes mellitus (HCC)    TYPE 2   GERD (gastroesophageal reflux disease)    History of blood transfusion    GI bleed   History of colon polyps    History of hiatal hernia    Hyperlipemia    Hypertension    Osteoarthritis    PAD (peripheral artery disease) (HCC)    a. stenting of his left common iliac artery >20 years ago. b. h/o LEIA stent and 2 stents to R SFA in 2011. c. 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 Noted during admission 04/2014 but upon review seems to be frequent finding for patient.   Stenosis of carotid artery    a. 50% right carotid stenosis by angiogram 04/2014.    Past Surgical History:  Procedure Laterality Date   ABDOMINAL AORTOGRAM W/LOWER EXTREMITY N/A 01/27/2017   Procedure: Abdominal Aortogram w/Lower Extremity;  Surgeon: Serene Gaile ORN, MD;  Location: [facility];  Service: Cardiovascular;  Laterality: N/A;  ABDOMINAL AORTOGRAM W/LOWER EXTREMITY N/A 06/08/2017   Procedure: ABDOMINAL AORTOGRAM W/LOWER EXTREMITY;  Surgeon: Serene Gaile ORN, MD;  Location: [facility];  Service: Cardiovascular;  Laterality: N/A;   ABDOMINAL AORTOGRAM W/LOWER EXTREMITY N/A 08/31/2017   Procedure: ABDOMINAL AORTOGRAM W/LOWER EXTREMITY;  Surgeon: Serene Gaile ORN, MD;  Location: [facility];  Service: Cardiovascular;  Laterality: N/A;  rt. unilateral   ANGIOPLASTY / STENTING FEMORAL     ANGIOPLASTY / STENTING ILIAC     ANTERIOR  CERVICAL CORPECTOMY N/A 10/12/2023   Procedure: ANTERIOR CERVICAL DECOMPRESSION VIA CORPECTOMY WITH RECONSTRUCTION INCLUDING ANTERIOR CERVICAL PLATING CERVICAL FIVE-SIX,CERVICAL SIX-SEVEN;  Surgeon: Colon Shove, MD;  Location: [facility];  Service: Neurosurgery;  Laterality: N/A;   AORTA - BILATERAL FEMORAL ARTERY BYPASS GRAFT N/A 07/06/2018   Procedure: AORTA BIFEMORAL BYPASS GRAFT USING 14X7MM X 40CM HEMASHIELD GOLD GRAFT;  Surgeon: Serene Gaile ORN, MD;  Location: [facility];  Service: Vascular;  Laterality: N/A;   CEREBRAL ANGIOGRAM N/A 05/14/2014   Procedure: CEREBRAL ANGIOGRAM;  Surgeon: Dorn JINNY Lesches, MD;  Location: [facility] CATH LAB;  Service: Cardiovascular;  Laterality: N/A;   COLONOSCOPY W/ POLYPECTOMY     ENDARTERECTOMY Right 09/08/2017   ENDARTERECTOMY FEMORAL Right 09/08/2017   Procedure: REDO RIGHT FEMORAL ENDARTECTOMY WITH PATCH ANGIOPLASTY.;  Surgeon: Serene Gaile ORN, MD;  Location: [facility];  Service: Vascular;  Laterality: Right;   EYE SURGERY Bilateral    cataract   FEMORAL ARTERY STENT Right 05/12/2010   Stented distally with a 6x100 Abbott absolute stent and proximally with a 6x60 Cook Zilver stent resulting in the reduction of the proximal segment 80% and mid segment 60-70% to 0% residual, LEFT common femoral artery stented with a 7x3 Smart stent resulting in reduction of 90% stenosis to 0% residual   FEMORAL-POPLITEAL BYPASS GRAFT Right 09/18/2016   Procedure: BYPASS GRAFT FEMORAL-POPLITEAL ARTERY;  Surgeon: Gaile ORN Serene, MD;  [facility];  Service: Vascular;  Laterality: Right;   FEMORAL-POPLITEAL BYPASS GRAFT Right 09/08/2017   Procedure: REDO BYPASS GRAFT FEMORAL-POPLITEAL ARTERY;  Surgeon: Serene Gaile ORN, MD;  [facility];  Service: Vascular;  Laterality: Right;   FEMORAL-POPLITEAL BYPASS GRAFT Right 07/06/2018   Procedure: REVISION RIGHT FEMORAL TO POPLITEAL ARTERY BYPASS GRAFT;  Surgeon: Serene Gaile ORN, MD;  [facility];  Service: Vascular;  Laterality: Right;   IR  CHOLANGIOGRAM EXISTING TUBE  08/04/2018   IR EXCHANGE BILIARY DRAIN  01/22/2019   IR PERC CHOLECYSTOSTOMY  07/19/2018   IR RADIOLOGIST EVAL & MGMT  08/31/2018   IR RADIOLOGIST EVAL & MGMT  01/17/2019   IR RADIOLOGIST EVAL & MGMT  02/01/2019   IR RADIOLOGIST EVAL & MGMT  02/08/2019   KNEE ARTHROSCOPY     left   LOWER EXTREMITY ANGIOGRAM N/A 05/14/2014   Procedure: LOWER EXTREMITY ANGIOGRAM;  Surgeon: Dorn JINNY Lesches, MD;  Location: [facility] CATH LAB;  Service: Cardiovascular;  Laterality: N/A;   LOWER EXTREMITY ANGIOGRAPHY N/A 09/21/2016   Procedure: Lower Extremity Angiography;  Surgeon: Redell LITTIE Door, MD;  Location: [facility] INVASIVE CV LAB;  Service: Cardiovascular;  Laterality: N/A;   PERIPHERAL VASCULAR ATHERECTOMY Right 01/27/2017   Procedure: PERIPHERAL VASCULAR ATHERECTOMY;  Surgeon: Serene Gaile ORN, MD;  [facility];  Service: Cardiovascular;  Laterality: Right;   PERIPHERAL VASCULAR BALLOON ANGIOPLASTY Right 06/08/2017   Procedure: PERIPHERAL VASCULAR BALLOON ANGIOPLASTY;  Surgeon: Serene Gaile ORN, MD;  [facility];  Service: Cardiovascular;  Laterality: Right;  common femoral and superficial femoral  arteries   PERIPHERAL VASCULAR CATHETERIZATION N/A 04/20/2016   Procedure: Lower Extremity Intervention;  Surgeon: Dorn JINNY Lesches, MD;  Location: [facility] INVASIVE CV LAB;  Service: Cardiovascular;  Laterality: N/A;   PERIPHERAL VASCULAR INTERVENTION  08/31/2017   Procedure: PERIPHERAL VASCULAR INTERVENTION;  Surgeon: Serene Gaile ORN, MD;  [facility];  Service: Cardiovascular;;  REIA   REVERSE SHOULDER ARTHROPLASTY Right 12/07/2016   REVERSE SHOULDER ARTHROPLASTY Right 12/07/2016   Procedure: REVERSE SHOULDER ARTHROPLASTY;  Surgeon: Kay Kemps, MD;  [facility] OR;  Service: Orthopedics;  Laterality: Right;   ROTATOR CUFF REPAIR Right 2003   SFA Right 05/14/2014   PTA  OF RT SFA         DR BERRY    Allergies  Allergen Reactions   Baclofen  Other (See  Comments)    Severe delirium on Baclofen  and Flexeril    Flexeril  [Cyclobenzaprine ] Other (See Comments)    Confusion/delirium   Asa [Aspirin ] Other (See Comments)    GI bleeding   Gadolinium Derivatives Itching, Swelling and Other (See Comments)    Pt complained of face flushing/hottness and throat tightness/scratchiness immediately after the injections.  Within 4 minutes, all symptoms were gone and the study was completed.  No further complications or signs of allergy were exhibited after completion of study.    Iodinated Contrast Media Other (See Comments)    Pt does not recall reaction   Pneumococcal 13-Val Conj Vacc     Other Reaction(s): achiness all over, dizziness, nausea, weakness   Scopolamine      Other Reaction(s): Delerium   Oxycodone -Acetaminophen  Other (See Comments)    Dizziness and feeling of being uncomfortable     Prior to Admission medications   Medication Sig Start Date End Date Taking? Authorizing Provider  acetaminophen  (TYLENOL ) 325 MG tablet Take 1-2 tablets (325-650 mg total) by mouth every 4 (four) hours as needed for mild pain (pain score 1-3). 11/23/23  Yes Love, Sharlet RAMAN, PA-C  ascorbic acid  (VITAMIN C ) 500 MG tablet Take 1 tablet (500 mg total) by mouth at bedtime. 11/23/23  Yes Love, Sharlet RAMAN, PA-C  bisacodyl  (DULCOLAX) 10 MG suppository Place 1 suppository (10 mg total) rectally daily. 11/24/23  Yes Love, Sharlet RAMAN, PA-C  cadexomer iodine  (IODOSORB) 0.9 % gel Apply 1 Application topically See admin instructions. Apply to right and left heel topically every Mon, Wed, and Fri for pressure wound.   Yes [provider]  dantrolene  (DANTRIUM ) 100 MG capsule Take 1 capsule (100 mg total) by mouth 2 (two) times daily. 11/24/23  Yes Love, Sharlet RAMAN, PA-C  diclofenac  Sodium (VOLTAREN ) 1 % GEL Apply 2 g topically 4 (four) times daily. 11/23/23  Yes Love, Sharlet RAMAN, PA-C  docusate sodium  (COLACE) 100 MG capsule Take 2 capsules (200 mg total) by mouth daily. 11/24/23  Yes  Love, Sharlet RAMAN, PA-C  DULoxetine  (CYMBALTA ) 20 MG capsule Take 1 capsule (20 mg total) by mouth daily. 10/19/23  Yes Lue Elsie BROCKS, MD  ferrous sulfate  325 (65 FE) MG tablet Take 1 tablet (325 mg total) by mouth at bedtime. Patient taking differently: Take 325 mg by mouth daily with breakfast. 11/23/23  Yes Love, Pamela S, PA-C  fluticasone  (FLONASE ) 50 MCG/ACT nasal spray Place 2 sprays into both nostrils daily as needed for allergies or rhinitis. 11/23/23  Yes Love, Sharlet RAMAN, PA-C  lidocaine  4 % Place 1 patch onto the skin daily.   Yes [provider]  linagliptin  (TRADJENTA ) 5 MG TABS tablet Take 1 tablet (  5 mg total) by mouth daily. 11/24/23  Yes Love, Sharlet RAMAN, PA-C  menthol -cetylpyridinium (CEPACOL) 3 MG lozenge Take 1 lozenge (3 mg total) by mouth as needed for sore throat. Patient taking differently: Take 1 lozenge by mouth daily as needed for sore throat. 10/19/23  Yes Love, Sharlet RAMAN, PA-C  Menthol -Methyl Salicylate  (MUSCLE RUB) 10-15 % CREA Apply 1 Application topically as needed for muscle pain. To legs and arms 11/23/23  Yes Love, Sharlet RAMAN, PA-C  metoprolol  succinate (TOPROL -XL) 25 MG 24 hr tablet Take 0.5 tablets (12.5 mg total) by mouth daily. 11/24/23  Yes Love, Sharlet RAMAN, PA-C  midodrine  (PROAMATINE ) 2.5 MG tablet Take 3 tablets (7.5 mg total) by mouth 3 (three) times daily with meals. Patient taking differently: Take 7.5 mg by mouth 3 (three) times daily with meals. Hold for SBP >130 11/23/23  Yes Love, Sharlet RAMAN, PA-C  Multiple Vitamin (MULTIVITAMIN WITH MINERALS) TABS tablet Take 1 tablet by mouth daily. 11/09/23  Yes Love, Sharlet RAMAN, PA-C  NON FORMULARY Take 150 mLs by mouth 3 (three) times daily. House suopplement   Yes [provider]  Nutritional Supplements (,FEEDING SUPPLEMENT, PROSOURCE PLUS) liquid Take 30 mLs by mouth daily. 11/24/23  Yes Love, Sharlet RAMAN, PA-C  Nutritional Supplements (,FEEDING SUPPLEMENT, PROSOURCE PLUS) liquid Take 30 mLs by mouth daily.   Yes  [provider]  pantoprazole  (PROTONIX ) 40 MG tablet Take 1 tablet (40 mg total) by mouth 2 (two) times daily. 09/10/22  Yes Court Dorn PARAS, MD  pregabalin  (LYRICA ) 50 MG capsule Take 1 capsule (50 mg total) by mouth 2 (two) times daily. 10/18/23  Yes Lue Elsie BROCKS, MD  rosuvastatin  (CRESTOR ) 20 MG tablet Take 20 mg by mouth every evening.    Yes [provider]  senna-docusate (SENOKOT-S) 8.6-50 MG tablet Take 2 tablets by mouth daily with breakfast. Patient taking differently: Take 1 tablet by mouth 2 (two) times daily. 11/24/23  Yes Love, Sharlet RAMAN, PA-C  simethicone  (MYLICON) 80 MG chewable tablet Chew 1 tablet (80 mg total) by mouth 4 (four) times daily as needed for flatulence (gas). Patient taking differently: Chew 80 mg by mouth every 6 (six) hours as needed for flatulence (gas). 11/23/23  Yes Love, Sharlet RAMAN, PA-C  tamsulosin  (FLOMAX ) 0.4 MG CAPS capsule Take 1 capsule (0.4 mg total) by mouth daily after supper. 11/23/23  Yes Love, Sharlet RAMAN, PA-C  traMADol  (ULTRAM ) 50 MG tablet Take 25 mg by mouth every 8 (eight) hours as needed for severe pain (pain score 7-10).   Yes [provider]  apixaban  (ELIQUIS ) 2.5 MG TABS tablet Take 1 tablet (2.5 mg total) by mouth 2 (two) times daily. Continue thorough 01/19/24 to complete DVT prophylaxis course Patient not taking: Reported on 02/06/2024 11/23/23   Love, Sharlet RAMAN, PA-C  feeding supplement (ENSURE ENLIVE / ENSURE PLUS) LIQD Take 237 mLs by mouth daily. Patient not taking: Reported on 02/06/2024 11/23/23   Love, Sharlet RAMAN, PA-C  melatonin 5 MG TABS Take 1 tablet (5 mg total) by mouth at bedtime as needed. Patient not taking: Reported on 02/06/2024 11/23/23   Love, Sharlet RAMAN, PA-C  Nystatin (GERHARDT'S BUTT CREAM) CREA Apply 1 Application topically 3 (three) times daily. Patient not taking: Reported on 02/06/2024 11/23/23   Love, Sharlet RAMAN, PA-C    Social History   Socioeconomic History   Marital status: Married     Spouse name: Not on file   Number of children: 2   Years of education: Not on  file   Highest education level: Not on file  Occupational History   Occupation: retired    Associate Professor: RETIRED  Tobacco Use   Smoking status: Former    Current packs/day: 0.00    Types: Cigarettes    Start date: 06/1963    Quit date: 06/1988    Years since quitting: 35.6   Smokeless tobacco: Never  Vaping Use   Vaping status: Never Used  Substance and Sexual Activity   Alcohol  use: Yes    Comment: rarely   Drug use: No   Sexual activity: Not on file  Other Topics Concern   Not on file  Social History Narrative   Not on file   Social Drivers of Health   Financial Resource Strain: Not on file  Food Insecurity: Patient Unable To Answer (02/07/2024)   Hunger Vital Sign    Worried About Running Out of Food in the Last Year: Patient unable to answer    Ran Out of Food in the Last Year: Patient unable to answer  Transportation Needs: Patient Unable To Answer (02/07/2024)   PRAPARE - Transportation    Lack of Transportation (Medical): Patient unable to answer    Lack of Transportation (Non-Medical): Patient unable to answer  Physical Activity: Not on file  Stress: Not on file  Social Connections: Unknown (02/07/2024)   Social Connection and Isolation Panel    Frequency of Communication with Friends and Family: Patient unable to answer    Frequency of Social Gatherings with Friends and Family: Patient unable to answer    Attends Religious Services: Patient unable to answer    Active Member of Clubs or Organizations: Patient unable to answer    Attends Banker Meetings: Not on file    Marital Status: Patient unable to answer  Intimate Partner Violence: Patient Unable To Answer (02/07/2024)   Humiliation, Afraid, Rape, and Kick questionnaire    Fear of Current or Ex-Partner: Patient unable to answer    Emotionally Abused: Patient unable to answer    Physically Abused: Patient unable to answer     Sexually Abused: Patient unable to answer     Family History  Problem Relation Age of Onset   Heart disease Mother    Leukemia Mother    Stomach cancer Father    Esophageal cancer Brother    Liver disease Brother    Alcoholism Brother    Leukemia Brother    Leukemia Brother    Diabetes type II Brother    Lung disease Sister     ROS: Otherwise negative unless mentioned in HPI  Physical Examination  Vitals:   02/07/24 0811 02/07/24 1128  BP: (!) 130/47 127/65  Pulse: (!) 113 (!) 103  Resp:  11  Temp:  98.4 F (36.9 C)  SpO2:  96%   Body mass index is 21.76 kg/m.  General:  WDWN in NAD Gait: Not observed HENT: WNL, normocephalic Pulmonary: normal non-labored breathing, without wheezing Cardiac: regular Abdomen: soft Vascular Exam/Pulses: No palpable femoral pulses, no doppler signals bilaterally. Left leg is cold and mottled to mid thigh, right leg is mottled and cold to knee. Motor is involuntarily intact, no sensation up to groins bilaterally Extremities: with ischemic changes, without Gangrene , without cellulitis; with open wounds of bilateral heels; pictures taken from chart since both heels dressed     Musculoskeletal: no muscle wasting or atrophy  Neurologic: A&O X 3;  No focal weakness or paresthesias are detected; speech is fluent/normal Psychiatric:  The pt  has Normal affect. Lymph:  Unremarkable  CBC    Component Value Date/Time   WBC 22.3 (H) 02/07/2024 0451   RBC 3.04 (L) 02/07/2024 0451   HGB 8.6 (L) 02/07/2024 0451   HCT 26.1 (L) 02/07/2024 0451   PLT 174 02/07/2024 0451   MCV 85.9 02/07/2024 0451   MCH 28.3 02/07/2024 0451   MCHC 33.0 02/07/2024 0451   RDW 14.5 02/07/2024 0451   LYMPHSABS 0.2 (L) 02/06/2024 1449   MONOABS 0.0 (L) 02/06/2024 1449   EOSABS 0.0 02/06/2024 1449   BASOSABS 0.0 02/06/2024 1449    BMET    Component Value Date/Time   NA 136 02/07/2024 0451   K 4.3 02/07/2024 0451   CL 101 02/07/2024 0451   CO2 26  02/07/2024 0451   GLUCOSE 116 (H) 02/07/2024 0451   BUN 39 (H) 02/07/2024 0451   CREATININE 1.14 02/07/2024 0451   CREATININE 1.33 (H) 04/10/2016 0851   CALCIUM  7.8 (L) 02/07/2024 0451   GFRNONAA >60 02/07/2024 0451   GFRAA 52 (L) 01/23/2019 0249    COAGS: Lab Results  Component Value Date   INR 1.2 02/07/2024   INR 1.2 02/06/2024   INR 1.05 08/03/2018     Non-Invasive Vascular Imaging:   ABI pending  Statin:  Yes.   Beta Blocker:  Yes.   Aspirin :  No. ACEI:  No. ARB:  No. CCB use:  No Other antiplatelets/anticoagulants:  Yes.   Eliquis    ASSESSMENT/PLAN: This is a 83 y.o. male with PMH significant for HTN, HLD, poorly controlled Type II DM, CKD III and PAD who was admitted yesterday from [facility] with AMS and SOB. He was found to be septic with PNA as well as bilateral heel ulcerations and sacral decubitus ulcers. He has extensive vascular surgery history. He initially underwent right femoral-popliteal bypass graft with vein on 09/18/2016 for claudication. He has since undergone several percutaneous as well as surgical revisions of his proximal bypass. Most recently on 07/06/2018 he underwent an aortobifemoral bypass graft including revision of the proximal portion of his right femoropopliteal bypass with an interposition Gore-Tex. Unfortunately on non invasive imagine in November of 2024 his RLE bypass was noted to be occluded. Today he has non palpable pulses in bilateral lower extremities. His left leg is mottled and cold to mid thigh, right is mottled and cold to level of knee. My concern is that he has occluded part of his aortobifemoral bypass graft.  ABI was initially ordered but I have placed order for STAT CTA to further evaluate. Patient is non ambulatory following his spinal cord injury. He at high risk for needing bilateral AKA's however if he has occluded limbs of his Aortobifemoral bypass these will need to be addressed first to allow for AKA's to have potential for  healing. I discussed my concerns with patient and his wife at bedside. The on call vascular surgeon, Dr. Lanis will follow up with patient and his wife after the CTA to discuss with them further management recommendations.   Teretha Damme PA-C [facility] [phone] 02/07/2024  11:39 AM  VASCULAR STAFF ADDENDUM: I have independently interviewed and examined the patient. I agree with the above.  In short, patient is an 83 year old gentleman with previous history of aortobifemoral bypass surgery, and right-sided distal bypass.  The distal bypass is known to be occluded.  Vascular surgery was called due to some discoloration appreciated on the left leg, specifically at the level of the thigh. On physical exam, the patient had  nonpalpable femoral pulses, both legs were very cold.  There appeared to be mottling with a frank line of demarcation at the mid thigh. Both the patient and his wife stated that this was not new, and have been present for quite some time.   Patient had both motor and sensory deficits.  The leg did not appear viable.  I had a very honest conversation with Jamarkus and his wife regarding the above, most notably that I think that his previous aortobifemoral bypass is occluded, at least the left limb.  We discussed that revascularization would involve opening the left limb with subsequent high above-knee amputation where demarcation ends.  The other option is palliative care.  I called Edgard's sons as well to discuss his care.  I think families leaning toward palliative care at this time.  This is not urgent, as this has been going on for weeks. I think that palliative care consultation would be extremely beneficial. I will continue to follow the patient.  Fonda FORBES Rim MD [facility] Phone Number: [phone] 02/07/2024 5:41 PM

## 2024-02-07 NOTE — Consult Note (Signed)
 WOC Nurse Consult Note: Reason for Consult: requested for sacrum and bilateral heels Wound type: Right Heel 100 % eschar unstageable 2.5 x 2.0, slough noted, Left Heel 100% eschar unstageable 1.0 x 1.0 dehydrated appearance, both LE cool to touch. Pressure Injury POA: Yes Drainage none Dressing procedure/placement/frequency: apply foam dressing every 3 day change out, Prevalon blue boots  Sacrum POA PI DTPI, 3.0 x 4.0 cm, maroon-purple discoloration with epidermal lifting, DTPI can continue to evolve and deepen or widen in shape or discoloration. Recommend foam dressing to off-load and protect skin. Periwound skin was intact  Please re-consult if further assistance is needed.  Thank-you,  Stephane Fought MSN, RN, CWOCN, CWCN-AP, CNS Contact Mon-Fri 0700-1500: [phone]

## 2024-02-07 NOTE — Plan of Care (Signed)
  Problem: Clinical Measurements: Goal: Ability to maintain clinical measurements within normal limits will improve Outcome: Progressing Goal: Diagnostic test results will improve Outcome: Progressing Goal: Respiratory complications will improve Outcome: Progressing Goal: Cardiovascular complication will be avoided Outcome: Progressing   Problem: Coping: Goal: Level of anxiety will decrease Outcome: Progressing   Problem: Elimination: Goal: Will not experience complications related to bowel motility Outcome: Progressing Goal: Will not experience complications related to urinary retention Outcome: Progressing   Problem: Pain Managment: Goal: General experience of comfort will improve and/or be controlled Outcome: Progressing   Problem: Safety: Goal: Ability to remain free from injury will improve Outcome: Progressing   Problem: Skin Integrity: Goal: Risk for impaired skin integrity will decrease Outcome: Progressing   Problem: Fluid Volume: Goal: Ability to maintain a balanced intake and output will improve Outcome: Progressing   Problem: Health Behavior/Discharge Planning: Goal: Ability to identify and utilize available resources and services will improve Outcome: Progressing   Problem: Metabolic: Goal: Ability to maintain appropriate glucose levels will improve Outcome: Progressing   Problem: Nutritional: Goal: Maintenance of adequate nutrition will improve Outcome: Progressing   Problem: Education: Goal: Knowledge of General Education information will improve Description: Including pain rating scale, medication(s)/side effects and non-pharmacologic comfort measures Outcome: Not Progressing   Problem: Health Behavior/Discharge Planning: Goal: Ability to manage health-related needs will improve Outcome: Not Progressing   Problem: Clinical Measurements: Goal: Will remain free from infection Outcome: Not Progressing   Problem: Activity: Goal: Risk for  activity intolerance will decrease Outcome: Not Progressing   Problem: Nutrition: Goal: Adequate nutrition will be maintained Outcome: Not Progressing   Problem: Education: Goal: Ability to describe self-care measures that may prevent or decrease complications (Diabetes Survival Skills Education) will improve Outcome: Not Progressing Goal: Individualized Educational Video(s) Outcome: Not Progressing   Problem: Coping: Goal: Ability to adjust to condition or change in health will improve Outcome: Not Progressing   Problem: Health Behavior/Discharge Planning: Goal: Ability to manage health-related needs will improve Outcome: Not Progressing

## 2024-02-07 NOTE — Progress Notes (Signed)
 PHARMACY - PHYSICIAN COMMUNICATION CRITICAL VALUE ALERT - BLOOD CULTURE IDENTIFICATION (BCID)  Christian Bailey is an 83 y.o. male who presented to [facility] on 02/06/2024 with a chief complaint of sepsis/PNA  Assessment:   Blood culture growing Proteus  Name of physician (or Provider) Contacted:  Dr. Vianne  Current antibiotics:  Rocephin  and Azithromycin   Changes to prescribed antibiotics recommended:  D/C Azithromycin   Results for orders placed or performed during the hospital encounter of 02/06/24  Blood Culture ID Panel (Reflexed) (Collected: 02/06/2024  2:49 PM)  Result Value Ref Range   Enterococcus faecalis NOT DETECTED NOT DETECTED   Enterococcus Faecium NOT DETECTED NOT DETECTED   Listeria monocytogenes NOT DETECTED NOT DETECTED   Staphylococcus species NOT DETECTED NOT DETECTED   Staphylococcus aureus (BCID) NOT DETECTED NOT DETECTED   Staphylococcus epidermidis NOT DETECTED NOT DETECTED   Staphylococcus lugdunensis NOT DETECTED NOT DETECTED   Streptococcus species NOT DETECTED NOT DETECTED   Streptococcus agalactiae NOT DETECTED NOT DETECTED   Streptococcus pneumoniae NOT DETECTED NOT DETECTED   Streptococcus pyogenes NOT DETECTED NOT DETECTED   A.calcoaceticus-baumannii NOT DETECTED NOT DETECTED   Bacteroides fragilis NOT DETECTED NOT DETECTED   Enterobacterales DETECTED (A) NOT DETECTED   Enterobacter cloacae complex NOT DETECTED NOT DETECTED   Escherichia coli NOT DETECTED NOT DETECTED   Klebsiella aerogenes NOT DETECTED NOT DETECTED   Klebsiella oxytoca NOT DETECTED NOT DETECTED   Klebsiella pneumoniae NOT DETECTED NOT DETECTED   Proteus species DETECTED (A) NOT DETECTED   Salmonella species NOT DETECTED NOT DETECTED   Serratia marcescens NOT DETECTED NOT DETECTED   Haemophilus influenzae NOT DETECTED NOT DETECTED   Neisseria meningitidis NOT DETECTED NOT DETECTED   Pseudomonas aeruginosa NOT DETECTED NOT DETECTED   Stenotrophomonas maltophilia NOT DETECTED  NOT DETECTED   Candida albicans NOT DETECTED NOT DETECTED   Candida auris NOT DETECTED NOT DETECTED   Candida glabrata NOT DETECTED NOT DETECTED   Candida krusei NOT DETECTED NOT DETECTED   Candida parapsilosis NOT DETECTED NOT DETECTED   Candida tropicalis NOT DETECTED NOT DETECTED   Cryptococcus neoformans/gattii NOT DETECTED NOT DETECTED   CTX-M ESBL NOT DETECTED NOT DETECTED   Carbapenem resistance IMP NOT DETECTED NOT DETECTED   Carbapenem resistance KPC NOT DETECTED NOT DETECTED   Carbapenem resistance NDM NOT DETECTED NOT DETECTED   Carbapenem resist OXA 48 LIKE NOT DETECTED NOT DETECTED   Carbapenem resistance VIM NOT DETECTED NOT DETECTED    Dail Cordella Misty 02/07/2024  7:05 AM

## 2024-02-07 NOTE — Progress Notes (Addendum)
 PROGRESS NOTE  Dov Dill FMW:[id number] DOB: 1940/07/01 DOA: 02/06/2024 PCP: Avva, Ravisankar, MD   LOS: 1 day   Brief Narrative / Interim history: 83 year old male with DM2, HTN, anemia of chronic disease, PAD, comes in from [facility] with weakness and shortness of breath, was also found to be altered.  He was febrile on admission to 102.1, and on further evaluation was found to have sepsis due to pneumonia and possible sacral and heel decubitus ulcer infection.  Subjective / 24h Interval events: Somewhat confused this morning, very difficult to understand  Assesement and Plan: Principal Problem:   Sepsis due to pneumonia [facility]) Active Problems:   Essential hypertension   Hyperlipidemia   Type 2 diabetes mellitus (HCC)   PAD (peripheral artery disease) (HCC)   PVD (peripheral vascular disease) (HCC)   CKD (chronic kidney disease) stage 3, GFR 30-59 ml/min (HCC)  Principal problem Sepsis due to pneumonia, Proteus bacteremia-based on leukocytosis, fever, imaging, shortness of breath.  Blood cultures this morning with Proteus bacteremia.  He was started on ceftriaxone , continue - Unlikely source from his wounds since clinically he did not appear overtly infected, and imaging is negative for osteomyelitis or deeper abscesses  Active problems Neurogenic bladder, hematuria -patient with traces of blood, likely traumatic.  It was replaced in the ER  Peripheral vascular disease-chronic, poor pulses, chronic calves and feet pain. Wife states that his feet have been much colder recently. I see slight discoloration on the plantar aspect and perhaps involving 3-4th toes. Vascular consulted, appreciate input  Hyperlipidemia-continue statin  CKD 3A-creatinine at baseline  Hypertension-I see that as an outpatient he is on metoprolol  but also midodrine .  Continue for now  DM2 -poorly controlled, placed on insulin , continue  Lab Results  Component Value Date   HGBA1C 8.0 (H) 10/02/2023    CBG (last 3)  Recent Labs    02/06/24 2034 02/07/24 0639  GLUCAP 135* 108*      Scheduled Meds:  (feeding supplement) PROSource Plus  30 mL Oral Daily   ascorbic acid   500 mg Oral QHS   bisacodyl   10 mg Rectal Daily   Chlorhexidine  Gluconate Cloth  6 each Topical Daily   dantrolene   100 mg Oral BID   diclofenac  Sodium  2 g Topical QID   docusate sodium   200 mg Oral Daily   DULoxetine   20 mg Oral Daily   [START ON 02/08/2024] enoxaparin  (LOVENOX ) injection  40 mg Subcutaneous Daily   ferrous sulfate   325 mg Oral Q breakfast   insulin  aspart  0-15 Units Subcutaneous TID WC   insulin  aspart  0-5 Units Subcutaneous QHS   lidocaine   1 patch Transdermal Q24H   metoprolol  succinate  12.5 mg Oral Daily   midodrine   7.5 mg Oral TID WC   multivitamin with minerals  1 tablet Oral Daily   pantoprazole   40 mg Oral BID   pregabalin   50 mg Oral BID   rosuvastatin   20 mg Oral QPM   senna-docusate  1 tablet Oral BID   tamsulosin   0.4 mg Oral QPC supper   Continuous Infusions:  cefTRIAXone  (ROCEPHIN )  IV     lactated ringers  150 mL/hr at 02/06/24 1547   lactated ringers  150 mL/hr (02/07/24 0914)   PRN Meds:.acetaminophen  **OR** acetaminophen , fluticasone , ondansetron  **OR** ondansetron  (ZOFRAN ) IV, simethicone , traMADol   Current Outpatient Medications  Medication Instructions   acetaminophen  (TYLENOL ) 325-650 mg, Oral, Every 4 hours PRN   apixaban  (ELIQUIS ) 2.5 mg, Oral, 2 times daily, Continue thorough 01/19/24 to complete  DVT prophylaxis course   ascorbic acid  (VITAMIN C ) 500 mg, Oral, Daily at bedtime   bisacodyl  (DULCOLAX) 10 mg, Rectal, Daily   cadexomer iodine  (IODOSORB) 0.9 % gel 1 Application, Topical, See admin instructions, Apply to right and left heel topically every Mon, Wed, and Fri for pressure wound.   dantrolene  (DANTRIUM ) 100 mg, Oral, 2 times daily   diclofenac  Sodium (VOLTAREN ) 2 g, Topical, 4 times daily   docusate sodium  (COLACE) 200 mg, Oral, Daily   DULoxetine   (CYMBALTA ) 20 mg, Oral, Daily   feeding supplement (ENSURE ENLIVE / ENSURE PLUS) LIQD 237 mLs, Oral, Every 24 hours   ferrous sulfate  325 mg, Oral, Daily at bedtime   fluticasone  (FLONASE ) 50 MCG/ACT nasal spray 2 sprays, Each Nare, Daily PRN   lidocaine  4 % 1 patch, Transdermal, Every 24 hours   linagliptin  (TRADJENTA ) 5 mg, Oral, Daily   melatonin 5 mg, Oral, At bedtime PRN   menthol -cetylpyridinium (CEPACOL) 3 mg, Oral, As needed   Menthol -Methyl Salicylate  (MUSCLE RUB) 10-15 % CREA 1 Application, Topical, As needed, To legs and arms   metoprolol  succinate (TOPROL -XL) 12.5 mg, Oral, Daily   midodrine  (PROAMATINE ) 7.5 mg, Oral, 3 times daily with meals   Multiple Vitamin (MULTIVITAMIN WITH MINERALS) TABS tablet 1 tablet, Oral, Daily   NON FORMULARY 150 mLs, Oral, 3 times daily, House suopplement   Nutritional Supplements (,FEEDING SUPPLEMENT, PROSOURCE PLUS) liquid 30 mLs, Oral, Daily   Nutritional Supplements (,FEEDING SUPPLEMENT, PROSOURCE PLUS) liquid 30 mLs, Oral, Daily   Nystatin (GERHARDT'S BUTT CREAM) CREA 1 Application, Topical, 3 times daily   pantoprazole  (PROTONIX ) 40 mg, Oral, 2 times daily   pregabalin  (LYRICA ) 50 mg, Oral, 2 times daily   rosuvastatin  (CRESTOR ) 20 mg, Oral, Every evening   senna-docusate (SENOKOT-S) 8.6-50 MG tablet 2 tablets, Oral, Daily with breakfast   simethicone  (MYLICON) 80 mg, Oral, 4 times daily PRN   tamsulosin  (FLOMAX ) 0.4 mg, Oral, Daily after supper   traMADol  (ULTRAM ) 25 mg, Oral, Every 8 hours PRN    Diet Orders (From admission, onward)     Start     Ordered   02/06/24 2029  Diet heart healthy/carb modified Room service appropriate? Yes; Fluid consistency: Thin  Diet effective now       Question Answer Comment  Diet-HS Snack? Nothing   Room service appropriate? Yes   Fluid consistency: Thin      02/06/24 2029            DVT prophylaxis: enoxaparin  (LOVENOX ) injection 40 mg Start: 02/08/24 1000   Lab Results  Component Value  Date   PLT 174 02/07/2024      Code Status: Do not attempt resuscitation (DNR) PRE-ARREST INTERVENTIONS DESIRED  Family Communication: no family at bedside  Status is: Inpatient Remains inpatient appropriate because: severity of illness  Level of care: Progressive  Consultants:  none  Objective: Vitals:   02/07/24 0537 02/07/24 0641 02/07/24 0713 02/07/24 0811  BP: 119/65  (!) 130/47 (!) 130/47  Pulse: (!) 120  (!) 113 (!) 113  Resp: 14  14   Temp: 97.7 F (36.5 C)  97.7 F (36.5 C)   TempSrc: Oral  Oral   SpO2: 97%  95%   Weight:  57.5 kg    Height:        Intake/Output Summary (Last 24 hours) at 02/07/2024 0957 Last data filed at 02/07/2024 0541 Gross per 24 hour  Intake 1568.65 ml  Output 1600 ml  Net -31.35 ml   Wt  Readings from Last 3 Encounters:  02/07/24 57.5 kg  10/18/23 60.6 kg  10/18/23 61.2 kg    Examination:  Constitutional: NAD Eyes: no scleral icterus ENMT: Mucous membranes are moist.  Neck: normal, supple Respiratory: no wheezing, shallow respirations Cardiovascular: Regular rate and rhythm, no murmurs / rubs / gallops. No LE edema.  Abdomen: non distended, no tenderness. Bowel sounds positive.  Musculoskeletal: no clubbing / cyanosis.    Data Reviewed: I have independently reviewed following labs and imaging studies   CBC Recent Labs  Lab 02/06/24 1449 02/07/24 0451  WBC 22.2* 22.3*  HGB 8.1* 8.6*  HCT 24.6* 26.1*  PLT 204 174  MCV 88.5 85.9  MCH 29.1 28.3  MCHC 32.9 33.0  RDW 14.1 14.5  LYMPHSABS 0.2*  --   MONOABS 0.0*  --   EOSABS 0.0  --   BASOSABS 0.0  --     Recent Labs  Lab 02/06/24 1449 02/06/24 1510 02/06/24 1752 02/07/24 0451  NA 134*  --   --  136  K 4.6  --   --  4.3  CL 98  --   --  101  CO2 24  --   --  26  GLUCOSE 118*  --   --  116*  BUN 52*  --   --  39*  CREATININE 1.49*  --   --  1.14  CALCIUM  8.0*  --   --  7.8*  AST 58*  --   --  116*  ALT 24  --   --  34  ALKPHOS 107  --   --  68  BILITOT  0.6  --   --  0.8  ALBUMIN  2.0*  --   --  1.8*  LATICACIDVEN  --  3.4* 1.5  --   INR 1.2  --   --  1.2    ------------------------------------------------------------------------------------------------------------------ No results for input(s): CHOL, HDL, LDLCALC, TRIG, CHOLHDL, LDLDIRECT in the last 72 hours.  Lab Results  Component Value Date   HGBA1C 8.0 (H) 10/02/2023   ------------------------------------------------------------------------------------------------------------------ No results for input(s): TSH, T4TOTAL, T3FREE, THYROIDAB in the last 72 hours.  Invalid input(s): FREET3  Cardiac Enzymes No results for input(s): CKMB, TROPONINI, MYOGLOBIN in the last 168 hours.  Invalid input(s): CK ------------------------------------------------------------------------------------------------------------------    Component Value Date/Time   BNP 338.1 (H) 05/11/2023 0323    CBG: Recent Labs  Lab 02/06/24 2034 02/07/24 0639  GLUCAP 135* 108*    Recent Results (from the past 240 hours)  Resp panel by RT-PCR (RSV, Flu A&B, Covid) Anterior Nasal Swab     Status: None   Collection Time: 02/06/24  2:09 PM   Specimen: Anterior Nasal Swab  Result Value Ref Range Status   SARS Coronavirus 2 by RT PCR NEGATIVE NEGATIVE Final   Influenza A by PCR NEGATIVE NEGATIVE Final   Influenza B by PCR NEGATIVE NEGATIVE Final    Comment: (NOTE) The Xpert Xpress SARS-CoV-2/FLU/RSV plus assay is intended as an aid in the diagnosis of influenza from Nasopharyngeal swab specimens and should not be used as a sole basis for treatment. Nasal washings and aspirates are unacceptable for Xpert Xpress SARS-CoV-2/FLU/RSV testing.  Fact Sheet for Patients: BloggerCourse.com  Fact Sheet for Healthcare Providers: SeriousBroker.it  This test is not yet approved or cleared by the [place]  FDA and has been  authorized for detection and/or diagnosis of SARS-CoV-2 by FDA under an Emergency Use Authorization (EUA). This EUA will remain in effect (meaning this test can be  used) for the duration of the COVID-19 declaration under Section 564(b)(1) of the Act, 21 U.S.C. section 360bbb-3(b)(1), unless the authorization is terminated or revoked.     Resp Syncytial Virus by PCR NEGATIVE NEGATIVE Final    Comment: (NOTE) Fact Sheet for Patients: BloggerCourse.com  Fact Sheet for Healthcare Providers: SeriousBroker.it  This test is not yet approved or cleared by the [place]  FDA and has been authorized for detection and/or diagnosis of SARS-CoV-2 by FDA under an Emergency Use Authorization (EUA). This EUA will remain in effect (meaning this test can be used) for the duration of the COVID-19 declaration under Section 564(b)(1) of the Act, 21 U.S.C. section 360bbb-3(b)(1), unless the authorization is terminated or revoked.  Performed at [facility], [address] [place], [place] [address]   Blood Culture (routine x 2)     Status: None (Preliminary result)   Collection Time: 02/06/24  2:14 PM   Specimen: BLOOD RIGHT FOREARM  Result Value Ref Range Status   Specimen Description BLOOD RIGHT FOREARM  Final   Special Requests   Final    BOTTLES DRAWN AEROBIC AND ANAEROBIC Blood Culture results may not be optimal due to an inadequate volume of blood received in culture bottles   Culture   Final    NO GROWTH < 24 HOURS Performed at [facility], [address] [place], [place] [address]    Report Status PENDING  Incomplete  Blood Culture (routine x 2)     Status: None (Preliminary result)   Collection Time: 02/06/24  2:49 PM   Specimen: BLOOD  Result Value Ref Range Status   Specimen Description BLOOD RIGHT ANTECUBITAL  Final   Special Requests   Final    BOTTLES DRAWN AEROBIC AND ANAEROBIC Blood Culture adequate volume    Culture  Setup Time   Final    GRAM NEGATIVE RODS ANAEROBIC BOTTLE ONLY CRITICAL RESULT CALLED TO, READ BACK BY AND VERIFIED WITH: PHARMD G ABBOTT 02/07/2024 @ 0703 BY AB Performed at [facility], [address], [place], [place] [address]    Culture GRAM NEGATIVE RODS  Final   Report Status PENDING  Incomplete  Blood Culture ID Panel (Reflexed)     Status: Abnormal   Collection Time: 02/06/24  2:49 PM  Result Value Ref Range Status   Enterococcus faecalis NOT DETECTED NOT DETECTED Final   Enterococcus Faecium NOT DETECTED NOT DETECTED Final   Listeria monocytogenes NOT DETECTED NOT DETECTED Final   Staphylococcus species NOT DETECTED NOT DETECTED Final   Staphylococcus aureus (BCID) NOT DETECTED NOT DETECTED Final   Staphylococcus epidermidis NOT DETECTED NOT DETECTED Final   Staphylococcus lugdunensis NOT DETECTED NOT DETECTED Final   Streptococcus species NOT DETECTED NOT DETECTED Final   Streptococcus agalactiae NOT DETECTED NOT DETECTED Final   Streptococcus pneumoniae NOT DETECTED NOT DETECTED Final   Streptococcus pyogenes NOT DETECTED NOT DETECTED Final   A.calcoaceticus-baumannii NOT DETECTED NOT DETECTED Final   Bacteroides fragilis NOT DETECTED NOT DETECTED Final   Enterobacterales DETECTED (A) NOT DETECTED Final    Comment: Enterobacterales represent a large order of gram negative bacteria, not a single organism. CRITICAL RESULT CALLED TO, READ BACK BY AND VERIFIED WITH: PHARMD G ABBOTT 02/07/2024 @ 0703 BY AB    Enterobacter cloacae complex NOT DETECTED NOT DETECTED Final   Escherichia coli NOT DETECTED NOT DETECTED Final   Klebsiella aerogenes NOT DETECTED NOT DETECTED Final   Klebsiella oxytoca NOT DETECTED NOT DETECTED Final  Klebsiella pneumoniae NOT DETECTED NOT DETECTED Final   Proteus species DETECTED (A) NOT DETECTED Final    Comment: CRITICAL RESULT CALLED TO, READ BACK BY AND VERIFIED WITH: PHARMD G ABBOTT 02/07/2024 @ 0703 BY AB    Salmonella  species NOT DETECTED NOT DETECTED Final   Serratia marcescens NOT DETECTED NOT DETECTED Final   Haemophilus influenzae NOT DETECTED NOT DETECTED Final   Neisseria meningitidis NOT DETECTED NOT DETECTED Final   Pseudomonas aeruginosa NOT DETECTED NOT DETECTED Final   Stenotrophomonas maltophilia NOT DETECTED NOT DETECTED Final   Candida albicans NOT DETECTED NOT DETECTED Final   Candida auris NOT DETECTED NOT DETECTED Final   Candida glabrata NOT DETECTED NOT DETECTED Final   Candida krusei NOT DETECTED NOT DETECTED Final   Candida parapsilosis NOT DETECTED NOT DETECTED Final   Candida tropicalis NOT DETECTED NOT DETECTED Final   Cryptococcus neoformans/gattii NOT DETECTED NOT DETECTED Final   CTX-M ESBL NOT DETECTED NOT DETECTED Final   Carbapenem resistance IMP NOT DETECTED NOT DETECTED Final   Carbapenem resistance KPC NOT DETECTED NOT DETECTED Final   Carbapenem resistance NDM NOT DETECTED NOT DETECTED Final   Carbapenem resist OXA 48 LIKE NOT DETECTED NOT DETECTED Final   Carbapenem resistance VIM NOT DETECTED NOT DETECTED Final    Comment: Performed at [facility], [address], [place], [place] [address]     Radiology Studies: DG Foot Complete Right Result Date: 02/06/2024 CLINICAL DATA:  Foot wounds, sepsis. EXAM: RIGHT FOOT COMPLETE - 3+ VIEW COMPARISON:  None Available. FINDINGS: There is no evidence of fracture or dislocation. No erosion or bony destructive change. Hammertoe deformity of the digits. Osteoarthritis of the first metatarsal phalangeal joint. There is thinning of the soft tissues posterior to the calcaneus. No soft tissue gas or radiopaque foreign body. IMPRESSION: 1. Thinning of the soft tissues posterior to the calcaneus. No radiographic findings of osteomyelitis. 2. Osteoarthritis of the first metatarsophalangeal joint. Electronically Signed   By: Andrea Gasman M.D.   On: 02/06/2024 15:51   DG Foot Complete Left Result Date: 02/06/2024 CLINICAL DATA:   Sepsis, foot wounds. EXAM: LEFT FOOT - COMPLETE 3+ VIEW COMPARISON:  None Available. FINDINGS: There is no evidence of fracture or dislocation. No erosive or bony destructive change. The site of wound is not well demonstrated by radiograph. No radiopaque foreign body or evidence of soft tissue gas. IMPRESSION: No radiographic findings of osteomyelitis. The site of wound is not well demonstrated by radiograph. Electronically Signed   By: Andrea Gasman M.D.   On: 02/06/2024 15:50   DG Chest Port 1 View Result Date: 02/06/2024 CLINICAL DATA:  Fever. EXAM: PORTABLE CHEST 1 VIEW COMPARISON:  10/25/2023 FINDINGS: The cardiomediastinal contours are normal. Mild hazy right perihilar opacity. Pulmonary vasculature is normal. No pleural effusion or pneumothorax. No acute osseous abnormalities are seen. IMPRESSION: Mild hazy right perihilar opacity may represent pneumonia in the appropriate clinical setting. Electronically Signed   By: Andrea Gasman M.D.   On: 02/06/2024 15:49   CT ABDOMEN PELVIS WO CONTRAST Result Date: 02/06/2024 CLINICAL DATA:  Sepsis, mental status change. EXAM: CT ABDOMEN AND PELVIS WITHOUT CONTRAST TECHNIQUE: Multidetector CT imaging of the abdomen and pelvis was performed following the standard protocol without IV contrast. RADIATION DOSE REDUCTION: This exam was performed according to the departmental dose-optimization program which includes automated exposure control, adjustment of the mA and/or kV according to patient size and/or use of iterative reconstruction technique. COMPARISON:  01/18/2019. FINDINGS: Lower chest: Dependent atelectasis  in the right lower lobe. Endobronchial debris and peribronchovascular nodularity in the left lower lobe. Heart is at the upper limits of normal in size. Decreased attenuation of the intravascular compartment is indicative of anemia. No pericardial or pleural effusion. Distal esophagus is grossly unremarkable. Hepatobiliary: Small hepatic cysts. No  specific follow-up necessary. Liver is otherwise grossly unremarkable. Cholecystectomy. No unexpected biliary ductal dilatation. Pancreas: Negative. Spleen: Negative. Adrenals/Urinary Tract: Adrenal glands are unremarkable. Low-attenuation and subcentimeter hyperdense lesions in the kidneys. No specific follow-up necessary. Scarring in the lower pole left kidney. Tiny right renal stones. Ureters are decompressed. Granular stone debris in the right posterolateral bladder. There may be minimal bladder wall thickening. Foley catheter balloon is inflated in the posterior urethra. Stomach/Bowel: Stomach, small bowel and colon are unremarkable. Appendix is not readily visualized. Vascular/Lymphatic: Aortobifemoral arterial bypass graft. Atherosclerotic calcification of the aorta. No pathologically enlarged lymph nodes. Reproductive: Prostate is mildly prominent. Other: No free fluid. Musculoskeletal: Osteopenia.  Degenerative changes in the spine. IMPRESSION: 1. Endobronchial debris and peribronchovascular nodularity in the left lower lobe, indicative of aspiration. 2. Tiny right renal stones. Granular stone debris layers in the right posterolateral bladder. Possible minimal bladder wall thickening. 3. Foley catheter balloon is inflated in the posterior urethra. 4.  Aortic atherosclerosis (ICD10-I70.0). Electronically Signed   By: Newell Eke M.D.   On: 02/06/2024 15:18   CT Head Wo Contrast Result Date: 02/06/2024 CLINICAL DATA:  Altered mental status. EXAM: CT HEAD WITHOUT CONTRAST TECHNIQUE: Contiguous axial images were obtained from the base of the skull through the vertex without intravenous contrast. RADIATION DOSE REDUCTION: This exam was performed according to the departmental dose-optimization program which includes automated exposure control, adjustment of the mA and/or kV according to patient size and/or use of iterative reconstruction technique. COMPARISON:  10/05/2023 FINDINGS: Brain: No evidence of  acute infarction, hemorrhage, hydrocephalus, extra-axial collection or mass lesion/mass effect. Minimal chronic ischemic microvascular disease. Vascular: No hyperdense vessel or unexpected calcification. Skull: Normal. Negative for fracture or focal lesion. Sinuses/Orbits: No acute finding. Other: None. IMPRESSION: 1. No acute findings. 2. Minimal chronic ischemic microvascular disease. Electronically Signed   By: Toribio Agreste M.D.   On: 02/06/2024 15:17     Nilda Fendt, MD, PhD [facility]  Between 7 am - 7 pm I am available, please contact me via Amion (for emergencies) or Securechat (non urgent messages)  Between 7 pm - 7 am I am not available, please contact night coverage MD/APP via Amion

## 2024-02-08 DIAGNOSIS — A419 Sepsis, unspecified organism: Secondary | ICD-10-CM | POA: Diagnosis not present

## 2024-02-08 DIAGNOSIS — R4182 Altered mental status, unspecified: Secondary | ICD-10-CM

## 2024-02-08 DIAGNOSIS — Z66 Do not resuscitate: Secondary | ICD-10-CM

## 2024-02-08 DIAGNOSIS — I739 Peripheral vascular disease, unspecified: Secondary | ICD-10-CM

## 2024-02-08 DIAGNOSIS — J189 Pneumonia, unspecified organism: Secondary | ICD-10-CM | POA: Diagnosis not present

## 2024-02-08 LAB — GLUCOSE, CAPILLARY
Glucose-Capillary: 160 mg/dL — ABNORMAL HIGH (ref 70–99)
Glucose-Capillary: 157 mg/dL — ABNORMAL HIGH (ref 70–99)
Glucose-Capillary: 111 mg/dL — ABNORMAL HIGH (ref 70–99)
Glucose-Capillary: 127 mg/dL — ABNORMAL HIGH (ref 70–99)

## 2024-02-08 LAB — URINE CULTURE: Culture: <10000 — AB

## 2024-02-08 MED ORDER — HYDROMORPHONE HCL 1 MG/ML IJ SOLN
0.5000 mg | INTRAMUSCULAR | Status: DC | PRN
  Administered 2024-02-08 – 2024-02-10 (×7): 0.5 mg via INTRAVENOUS
  Filled 2024-02-08 (×5): qty 0.5

## 2024-02-08 NOTE — Evaluation (Signed)
 Clinical/Bedside Swallow Evaluation Patient Details  Name: Christian Bailey MRN: [id number] Date of Birth: Jul 24, 1940  Today's Date: 02/08/2024 Time: SLP Start Time (ACUTE ONLY): 1303 SLP Stop Time (ACUTE ONLY): 1325 SLP Time Calculation (min) (ACUTE ONLY): 22 min  Past Medical History:  Past Medical History:  Diagnosis Date   Anemia    Complication of anesthesia    Diabetes mellitus (HCC)    TYPE 2   GERD (gastroesophageal reflux disease)    History of blood transfusion    GI bleed   History of colon polyps    History of hiatal hernia    Hyperlipemia    Hypertension    Osteoarthritis    PAD (peripheral artery disease) (HCC)    a. stenting of his left common iliac artery >20 years ago. b. h/o LEIA stent and 2 stents to R SFA in 2011. c. 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 Noted during admission 04/2014 but upon review seems to be frequent finding for patient.   Stenosis of carotid artery    a. 50% right carotid stenosis by angiogram 04/2014.   Past Surgical History:  Past Surgical History:  Procedure Laterality Date   ABDOMINAL AORTOGRAM W/LOWER EXTREMITY N/A 01/27/2017   Procedure: Abdominal Aortogram w/Lower Extremity;  Surgeon: Serene Gaile ORN, MD;  Location: [facility];  Service: Cardiovascular;  Laterality: N/A;   ABDOMINAL AORTOGRAM W/LOWER EXTREMITY N/A 06/08/2017   Procedure: ABDOMINAL AORTOGRAM W/LOWER EXTREMITY;  Surgeon: Serene Gaile ORN, MD;  Location: [facility];  Service: Cardiovascular;  Laterality: N/A;   ABDOMINAL AORTOGRAM W/LOWER EXTREMITY N/A 08/31/2017   Procedure: ABDOMINAL AORTOGRAM W/LOWER  EXTREMITY;  Surgeon: Serene Gaile ORN, MD;  Location: [facility];  Service: Cardiovascular;  Laterality: N/A;  rt. unilateral   ANGIOPLASTY / STENTING FEMORAL     ANGIOPLASTY / STENTING ILIAC     ANTERIOR CERVICAL CORPECTOMY N/A 10/12/2023   Procedure: ANTERIOR CERVICAL DECOMPRESSION VIA CORPECTOMY WITH RECONSTRUCTION INCLUDING ANTERIOR CERVICAL PLATING CERVICAL FIVE-SIX,CERVICAL SIX-SEVEN;  Surgeon: Colon Shove, MD;  Location: [facility];  Service: Neurosurgery;  Laterality: N/A;   AORTA - BILATERAL FEMORAL ARTERY BYPASS GRAFT N/A 07/06/2018   Procedure: AORTA BIFEMORAL BYPASS GRAFT USING 14X7MM X 40CM HEMASHIELD GOLD GRAFT;  Surgeon: Serene Gaile ORN, MD;  Location: [facility];  Service: Vascular;  Laterality: N/A;   CEREBRAL ANGIOGRAM N/A 05/14/2014   Procedure: CEREBRAL ANGIOGRAM;  Surgeon: Dorn JINNY Lesches, MD;  Location: [facility] CATH LAB;  Service: Cardiovascular;  Laterality: N/A;   COLONOSCOPY W/ POLYPECTOMY     ENDARTERECTOMY Right 09/08/2017   ENDARTERECTOMY FEMORAL Right 09/08/2017   Procedure: REDO RIGHT FEMORAL ENDARTECTOMY WITH PATCH ANGIOPLASTY.;  Surgeon: Serene Gaile ORN, MD;  Location: [facility];  Service: Vascular;  Laterality: Right;   EYE SURGERY Bilateral    cataract   FEMORAL ARTERY STENT Right 05/12/2010   Stented distally with a 6x100 Abbott absolute stent and proximally with a 6x60 Cook Zilver stent resulting in the reduction of the proximal segment 80% and mid segment 60-70% to 0% residual, LEFT common femoral artery stented with a 7x3 Smart stent resulting in reduction of 90%  stenosis to 0% residual   FEMORAL-POPLITEAL BYPASS GRAFT Right 09/18/2016   Procedure: BYPASS GRAFT FEMORAL-POPLITEAL ARTERY;  Surgeon: Gaile LELON New, MD;  Location: [facility];  Service: Vascular;  Laterality: Right;   FEMORAL-POPLITEAL BYPASS GRAFT Right 09/08/2017   Procedure: REDO BYPASS GRAFT FEMORAL-POPLITEAL ARTERY;  Surgeon: New Gaile LELON, MD;  Location: [facility];  Service: Vascular;  Laterality: Right;    FEMORAL-POPLITEAL BYPASS GRAFT Right 07/06/2018   Procedure: REVISION RIGHT FEMORAL TO POPLITEAL ARTERY BYPASS GRAFT;  Surgeon: New Gaile LELON, MD;  Location: [facility];  Service: Vascular;  Laterality: Right;   IR CHOLANGIOGRAM EXISTING TUBE  08/04/2018   IR EXCHANGE BILIARY DRAIN  01/22/2019   IR PERC CHOLECYSTOSTOMY  07/19/2018   IR RADIOLOGIST EVAL & MGMT  08/31/2018   IR RADIOLOGIST EVAL & MGMT  01/17/2019   IR RADIOLOGIST EVAL & MGMT  02/01/2019   IR RADIOLOGIST EVAL & MGMT  02/08/2019   KNEE ARTHROSCOPY     left   LOWER EXTREMITY ANGIOGRAM N/A 05/14/2014   Procedure: LOWER EXTREMITY ANGIOGRAM;  Surgeon: Dorn JINNY Lesches, MD;  Location: [facility] CATH LAB;  Service: Cardiovascular;  Laterality: N/A;   LOWER EXTREMITY ANGIOGRAPHY N/A 09/21/2016   Procedure: Lower Extremity Angiography;  Surgeon: Redell LITTIE Door, MD;  Location: [facility] INVASIVE CV LAB;  Service: Cardiovascular;  Laterality: N/A;   PERIPHERAL VASCULAR ATHERECTOMY Right 01/27/2017   Procedure: PERIPHERAL VASCULAR ATHERECTOMY;  Surgeon: New Gaile LELON, MD;  [facility]: [facility];  Service: Cardiovascular;  Laterality: Right;   PERIPHERAL VASCULAR BALLOON ANGIOPLASTY Right 06/08/2017   Procedure: PERIPHERAL VASCULAR BALLOON ANGIOPLASTY;  Surgeon: New Gaile LELON, MD;  [facility]: [facility];  Service: Cardiovascular;  Laterality: Right;  common femoral and superficial femoral arteries   PERIPHERAL VASCULAR CATHETERIZATION N/A 04/20/2016   Procedure: Lower Extremity Intervention;  Surgeon: Dorn JINNY Lesches, MD;  Location: [facility] INVASIVE CV LAB;  Service: Cardiovascular;  Laterality: N/A;   PERIPHERAL VASCULAR INTERVENTION  08/31/2017   Procedure: PERIPHERAL VASCULAR INTERVENTION;  Surgeon: New Gaile LELON, MD;  Location: [facility];  Service: Cardiovascular;;  REIA   REVERSE SHOULDER ARTHROPLASTY Right 12/07/2016   REVERSE SHOULDER ARTHROPLASTY Right 12/07/2016   Procedure: REVERSE SHOULDER ARTHROPLASTY;  Surgeon: Kay Kemps, MD;   Location: [facility] OR;  Service: Orthopedics;  Laterality: Right;   ROTATOR CUFF REPAIR Right 2003   SFA Right 05/14/2014   PTA  OF RT SFA         DR BERRY   HPI:  83 year old male with DM2, HTN, anemia of chronic disease, PAD, comes in from [facility] nursing home with weakness and shortness of breath, was also found to be altered. He was febrile on admission to 102.1, and on further evaluation was found to have sepsis due to pneumonia and possible sacral and heel decubitus ulcer infection.   Patient has chronic indwelling Foley catheter, decubitus ulcers.  Chest x-ray showed left perihilar infiltrate.  CT scan shows endobronchial debris.  Recent extensive hospitalizations, severe peripheral vascular disease and declining health.    Assessment / Plan / Recommendation  Clinical Impression   Pt is known to SLP caseload from recent, prior admission in April 2025. During CIR stay, he was advanced to a mechanical soft diet and thin liquids and discharged from SLP caseload.   Today, pt presents with a mild oral dysphagia and concerns for a pharyngoesophageal dysphagia. Oral deficits characterized by perseverative mastication of solids with some pieces left unchewed. Pt had to orally expectorate bites of  salad and tomato. He was able to clear softer solids with liquids washes (both prompted and unprompted). Pt demonstrated multiple swallows with sips of thin liquids and throat clearing and regurgitation sounds.   SLP recommended diet downgrade to mechanical soft solids and consideration of MBSS if there are further concerns for aspiration with PO intake. Pt agreeable to diet modification. We agreed to SLP follow up to further assess if MBSS is needed later this week.   Plan: SLP will follow up to assess diet tolerance and modify diet as indicated or order MBSS. Continue SLP PoC.   SLP Visit Diagnosis: Dysphagia, unspecified (R13.10);Dysphagia, oral phase (R13.11)    Aspiration Risk  Mild aspiration risk     Diet Recommendation Dysphagia 3 (Mech soft);Thin liquid    Liquid Administration via: Cup;Straw Medication Administration: Whole meds with liquid Supervision: Patient able to self feed;Intermittent supervision to cue for compensatory strategies Compensations: Slow rate;Small sips/bites Postural Changes: Seated upright at 90 degrees    Other  Recommendations Oral Care Recommendations: Oral care BID     Assistance Recommended at Discharge  Full supervision   Functional Status Assessment Patient has had a recent decline in their functional status and demonstrates the ability to make significant improvements in function in a reasonable and predictable amount of time.  Frequency and Duration min 1 x/week  2 weeks       Prognosis Prognosis for improved oropharyngeal function: Fair Barriers to Reach Goals: Cognitive deficits      Swallow Study   General Date of Onset: 02/08/24 HPI: 83 year old male with DM2, HTN, anemia of chronic disease, PAD, comes in from [facility] with weakness and shortness of breath, was also found to be altered. He was febrile on admission to 102.1, and on further evaluation was found to have sepsis due to pneumonia and possible sacral and heel decubitus ulcer infection.   Patient has chronic indwelling Foley catheter, decubitus ulcers.  Chest x-ray showed left perihilar infiltrate.  CT scan shows endobronchial debris.  Recent extensive hospitalizations, severe peripheral vascular disease and declining health. Type of Study: Bedside Swallow Evaluation Previous Swallow Assessment: Last followed by SLP on CIR and was d/c'd from SLP service on 10/22/23 on a mechanical soft diet and thin liquids Diet Prior to this Study: Regular;Thin liquids (Level 0) Temperature Spikes Noted: No History of Recent Intubation: No Behavior/Cognition: Alert;Cooperative;Pleasant mood;Confused Oral Cavity Assessment: Within Functional Limits Oral Cavity - Dentition:  Missing dentition Vision: Functional for self-feeding Self-Feeding Abilities: Able to feed self Patient Positioning: Upright in bed Baseline Vocal Quality: Normal Volitional Swallow: Able to elicit    Oral/Motor/Sensory Function Overall Oral Motor/Sensory Function: Within functional limits   Ice Chips Ice chips: Not tested   Thin Liquid Thin Liquid: Within functional limits Presentation: Straw;Self Fed    Nectar Thick Nectar Thick Liquid: Not tested   Honey Thick Honey Thick Liquid: Not tested   Puree Puree: Within functional limits Presentation: Self Fed   Solid     Solid: Impaired Presentation: Self Fed Oral Phase Impairments: Impaired mastication Oral Phase Functional Implications: Prolonged oral transit;Oral residue;Impaired mastication (expactoration of some solids) Pharyngeal Phase Impairments: Throat Clearing - Delayed;Multiple swallows (sounds of regurgitation or backflow)      [name] 02/08/2024,2:02 PM

## 2024-02-08 NOTE — Plan of Care (Signed)
  Problem: SLP Dysphagia Goals Goal: Misc Dysphagia Goal Flowsheets (Taken 02/08/2024 1352) Misc Dysphagia Goal: Pt will tolerate a mechanical soft diet and thin liquids with no overt or subtle s/s of aspiration or concern for pulmonary infection.

## 2024-02-08 NOTE — Plan of Care (Signed)

## 2024-02-08 NOTE — Progress Notes (Signed)
 Patient seen and examined this morning  Mental status waxes and wanes.  Had a nice conversation with his wife.  I also called one of his sons, Velinda. At this time, family is interested in palliative care.  I think this is very reasonable.  I will continue to follow.  Appreciate palliative care involvement.

## 2024-02-08 NOTE — Consult Note (Signed)
 Palliative Medicine Inpatient Consult Note  Consulting Provider:  Trixie Nilda HERO, MD   Reason for consult:  GOC, very advanced vasculopath, worsening PAD, multiple prior interventions, now bilateral ischemic legs, not much left other than bilateral AKA which is not ideal. Vascular recommends palliative approach  02/08/2024  HPI:  Per intake H&P -->  83 y.o. male with PMH significant for HTN, HLD, poorly controlled Type II DM, CKD III and PAD who was admitted  from [facility] with AMS and SOB.  Identified to be septics from pneumonia. Palliative care has been asked to support additional goals of care conversations in the setting of healthcare decisions  Clinical Assessment/Goals of Care:  *Please note that this is a verbal dictation therefore any spelling or grammatical errors are due to the Dragon Medical One system interpretation.  I have reviewed medical records including EPIC notes, labs and imaging, received report from bedside RN, assessed the patient who is lying in bed alert to self, place and situation.    I met with Franky Shin to further discuss diagnosis prognosis, GOC, EOL wishes, disposition and options.   I introduced Palliative Medicine as specialized medical care for people living with serious illness. It focuses on providing relief from the symptoms and stress of a serious illness. The goal is to improve quality of life for both the patient and the family.  Medical History Review and Understanding:  A review of Liev's past medical history inclusive of type 2 diabetes, osteoarthritis, hypertension, hyperlipidemia, peripheral arterial disease, carotid artery stenosis, and anemia was completed.  Social History:  Westyn is formally from [place] .  He has lived in [place]  for almost 20 years.  He and his wife, [name] have been married for 58 years.  They share 2 sons and 2 grandchildren.  Antawn shares that his oldest son, Toribio is an Art gallery manager who  lives locally and his younger son, Evalene works in Multimedia programmer estate and lives in [place] .  He vocalizes how proud of them he is during our conversation.  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  Arend is a man of [employer] and shares that he has his own relationship with God.  Functional and Nutritional State:  Preceding hospitalization Alexus has had a difficult 4 to 8-month period of decline.  He shares he is no longer able to walk Norcia independent.  He is able to feed himself though otherwise relies on other individuals to support his basic activities of daily living.  Vivan shares his appetite has been poor.  Advance Directives:  A detailed discussion was had today regarding advanced directives.  Samik shares a surrogate decision makers are his wife [name] and his eldest son, Toribio.   Code Status:  Concepts specific to code status, artifical feeding and hydration, continued IV antibiotics and rehospitalization was had.  The difference between a aggressive medical intervention path  and a palliative comfort care path for this patient at this time was had.   Flavio is an established DO NOT RESUSCITATE DO NOT INTUBATE CODE STATUS.  Discussion:  Aneesh shares with me that he has white legs.  He shares that the vascular surgery team evaluated him yesterday though he is not quite clear on what the decision is.  He shares he is deferring decisions over what to do next to his wife and son.  I asked Jayvian assertively what he would want.  He shares although he does not want to leave this world he also does not want his wife to be his primary caregiver for an indefinite amount of time as this impedes her quality of life.    We discussed the possibilities with bilateral above-the-knee amputations and the potential sequela that they may have inclusive of the patient being bedbound, potential  secondary infection at incision site, dehiscence of the surgical site, lack of proper healing due to peripheral arterial disease and type 2 diabetes.  We also discussed the impact this would have on his overall day-to-day function, activity, mental state.  At this point in time Donnavin wants his wife and son to determine what they feel is best for him.  We did discuss comfort focused care and not going through surgical intervention.  We reviewed allowing nature to take its course with a calm and peaceful passing from this earth.  Abhay shares he would like for me to speak to his wife more and I asserted that that is my plan today.  I shared I would follow-up with him in the oncoming days irregardless to provide ongoing support.  Discussed the importance of continued conversation with family and their  medical providers regarding overall plan of care and treatment options, ensuring decisions are within the context of the patients values and GOCs. _________________________ Addendum:  I called patient's wife, [name] this late morning.  She shares with me that she feels extremely overwhelmed and needs time to process with the vascular surgery team and hospitalist teams have shared with her.  At this point in time she does not want to talk about what steps to take next today though she is willing to speak more in the oncoming days.  [name] shares her son is coming from [place]  and they will all speak among themselves to help determine what is the best next step.  I asked Henriette if I could call her to follow-up tomorrow which she did agree towards.  Decision Maker: Airen, Stiehl (Spouse): [phone] (Mobile)   SUMMARY OF RECOMMENDATIONS   DNAR/DNI  Patient and his wife do seem aware of the severity of his peripheral arterial disease  Both the vascular surgery team and the hospitalist service have recommended hospice  Patient's wife would like time to process the information provided  she plans to speak with both sons about what steps to take from here  Ongoing palliative care support  Code Status/Advance Care Planning: DNAR/DNI  Palliative Prophylaxis:  Aspiration, Bowel Regimen, Delirium Protocol, Frequent Pain Assessment, Oral Care, Palliative Wound Care, and Turn Reposition  Additional Recommendations (Limitations, Scope, Preferences): Continue current care  Psycho-social/Spiritual:  Desire for further Chaplaincy support: Yes patient is [employer] Additional Recommendations: Education on severity of peripheral vascular disease and long-term outcomes   Prognosis: Poor overall hospice is appropriate as patient falls within the confines of 6 months or less to live  Discharge Planning: Discharge plan to be determined  Vitals:   02/08/24 0100 02/08/24 0347  BP:  (!) 139/90  Pulse: 96 97  Resp: 18 20  Temp:  (!) 97.5 F (36.4 C)  SpO2: 97% 96%    Intake/Output Summary (Last 24 hours) at 02/08/2024 0703 Last data filed at 02/08/2024 0600 Gross per 24 hour  Intake 2295.53 ml  Output 2000 ml  Net 295.53 ml   Last Weight  Most recent update: 02/07/2024  6:41 AM    Weight  57.5 kg (126 lb 12.2 oz)  Gen: Elderly Caucasian chronically ill-appearing male HEENT: moist mucous membranes CV: Regular rate and rhythm PULM: On room air breathing is even and unlabored ABD: soft/nontender EXT: Absent bilateral pedal pulses, cool, white Neuro: Alert and oriented x3  PPS: 30%   This conversation/these recommendations were discussed with patient primary care team, Dr. Raenelle ______________________________________________________ Rosaline Becton [facility] Palliative Medicine Team Team Cell Phone: [phone] Please utilize secure chat with additional questions, if there is no response within 30 minutes please call the above phone number  Total Time: 75 Billing based on MDM: High  Palliative Medicine Team providers are available by phone  from 7am to 7pm daily and can be reached through the team cell phone.  Should this patient require assistance outside of these hours, please call the patient's attending physician.

## 2024-02-08 NOTE — Progress Notes (Signed)
 PROGRESS NOTE    Christian Bailey  FMW:[id number] DOB: 04-Mar-1941 DOA: 02/06/2024 PCP: Janey Santos, MD    Brief Narrative:  83 year old male with DM2, HTN, anemia of chronic disease, PAD, comes in from [facility] with weakness and shortness of breath, was also found to be altered. He was febrile on admission to 102.1, and on further evaluation was found to have sepsis due to pneumonia and possible sacral and heel decubitus ulcer infection.  Patient has chronic indwelling Foley catheter, decubitus ulcers.  Chest x-ray showed left perihilar infiltrate. CT scan shows endobronchial debris. Recent extensive hospitalizations, severe peripheral vascular disease and declining health.  Subjective: Patient seen and examined.  He does tell me about him having pain and discomfort of the legs.  His wife is at the bedside.  Known to this provider from previous hospitalization when he was very sick and underwent cervical spine stabilization.  Patient never went home from that admission.  He has been to different places for rehab and not successful.  Afebrile last 24 hours.  Gets easily irritated and argumentative with his wife.  Forgetful.  Both legs are cold.  Assessment & Plan:   Sepsis present on admission secondary to pneumonia, suspected aspiration pneumonia.  Proteus bacteremia: Presented with leukocytosis, fever and shortness of breath.  Blood culture positive for Proteus bacteremia.  Currently remains on high-dose Rocephin .  Final cultures pending.  Continue antibiotics. Suspected aspiration pneumonia.  Speech therapy to evaluate. Use incentive spirometer as much as possible.  Neurogenic bladder: Foley catheter exchanged.  Urine cultures pending.  Peripheral vascular disease: Chronic, occluded and thrombotic bypass graft.  Vascular following.Patient has ischemia of both legs, not salvageable by vascular intervention.  Recommended bilateral above-knee amputation versus  palliation.  Hyperlipidemia: On a statin.  CKD stage IIIa: At about baseline.  Type 2 diabetes: On insulin .    Pressure ulcer present on admission: Local wound care.  Reportedly not infected.  Goal of care: Remains very debilitated with difficult to treat conditions.  Ischemic bilateral lower leg and no options for revascularization. Multiple medical issues, immobility. Patient had palliative care input.  Patient may benefit with palliation and hospice if tolerated by family.  I relayed this conversation and recommendations to patient's wife.  She is waiting for her sons to show up tonight and discuss further.     DVT prophylaxis: enoxaparin  (LOVENOX ) injection 40 mg Start: 02/08/24 1000   Code Status: DNR with intervention Family Communication: Wife at the bedside Disposition Plan: Status is: Inpatient Remains inpatient appropriate because: IV antibiotics,     Consultants:  Vascular surgery Palliative care  Procedures:  None  Antimicrobials:  Rocephin  8/10---     Objective: Vitals:   02/08/24 0347 02/08/24 0801 02/08/24 0949 02/08/24 1108  BP: (!) 139/90 (!) 173/74 (!) 144/80 (!) 104/57  Pulse: 97 (!) 108 (!) 106 90  Resp: 20 20 15 12   Temp: (!) 97.5 F (36.4 C) 97.7 F (36.5 C)  98.3 F (36.8 C)  TempSrc: Oral   Oral  SpO2: 96% 99% 98% 98%  Weight:      Height:        Intake/Output Summary (Last 24 hours) at 02/08/2024 1332 Last data filed at 02/08/2024 0600 Gross per 24 hour  Intake 1818.53 ml  Output 2000 ml  Net -181.47 ml   Filed Weights   02/06/24 1404 02/07/24 0641  Weight: 53.1 kg 57.5 kg    Examination:  General: Chronically sick looking gentleman.  Anxious. Cardiovascular: S1-S2 normal.  Regular rate rhythm. Respiratory: Bilateral clear.  No added sounds. Gastrointestinal: Soft.  Nontender.  Bowel sound present. Ext: No swelling or edema. Neuro: Alert awake and mostly oriented.  Irritable on exam.  Forgetful.  Gross generalized  weakness. Both legs are ice cold with no palpable pulse starting from femoral artery.      Data Reviewed: I have personally reviewed following labs and imaging studies  CBC: Recent Labs  Lab 02/06/24 1449 02/07/24 0451  WBC 22.2* 22.3*  NEUTROABS 22.0*  --   HGB 8.1* 8.6*  HCT 24.6* 26.1*  MCV 88.5 85.9  PLT 204 174   Basic Metabolic Panel: Recent Labs  Lab 02/06/24 1449 02/07/24 0451  NA 134* 136  K 4.6 4.3  CL 98 101  CO2 24 26  GLUCOSE 118* 116*  BUN 52* 39*  CREATININE 1.49* 1.14  CALCIUM  8.0* 7.8*   GFR: Estimated Creatinine Clearance: 40.6 mL/min (by C-G formula based on SCr of 1.14 mg/dL). Liver Function Tests: Recent Labs  Lab 02/06/24 1449 02/07/24 0451  AST 58* 116*  ALT 24 34  ALKPHOS 107 68  BILITOT 0.6 0.8  PROT 5.4* 5.1*  ALBUMIN  2.0* 1.8*   No results for input(s): LIPASE, AMYLASE in the last 168 hours. No results for input(s): AMMONIA in the last 168 hours. Coagulation Profile: Recent Labs  Lab 02/06/24 1449 02/07/24 0451  INR 1.2 1.2   Cardiac Enzymes: No results for input(s): CKTOTAL, CKMB, CKMBINDEX, TROPONINI in the last 168 hours. BNP (last 3 results) No results for input(s): PROBNP in the last 8760 hours. HbA1C: No results for input(s): HGBA1C in the last 72 hours. CBG: Recent Labs  Lab 02/07/24 1134 02/07/24 1600 02/07/24 2114 02/08/24 0558 02/08/24 1107  GLUCAP 178* 209* 190* 160* 157*   Lipid Profile: No results for input(s): CHOL, HDL, LDLCALC, TRIG, CHOLHDL, LDLDIRECT in the last 72 hours. Thyroid  Function Tests: No results for input(s): TSH, T4TOTAL, FREET4, T3FREE, THYROIDAB in the last 72 hours. Anemia Panel: No results for input(s): VITAMINB12, FOLATE, FERRITIN, TIBC, IRON, RETICCTPCT in the last 72 hours. Sepsis Labs: Recent Labs  Lab 02/06/24 1510 02/06/24 1752  LATICACIDVEN 3.4* 1.5    Recent Results (from the past 240 hours)  Resp panel by  RT-PCR (RSV, Flu A&B, Covid) Anterior Nasal Swab     Status: None   Collection Time: 02/06/24  2:09 PM   Specimen: Anterior Nasal Swab  Result Value Ref Range Status   SARS Coronavirus 2 by RT PCR NEGATIVE NEGATIVE Final   Influenza A by PCR NEGATIVE NEGATIVE Final   Influenza B by PCR NEGATIVE NEGATIVE Final    Comment: (NOTE) The Xpert Xpress SARS-CoV-2/FLU/RSV plus assay is intended as an aid in the diagnosis of influenza from Nasopharyngeal swab specimens and should not be used as a sole basis for treatment. Nasal washings and aspirates are unacceptable for Xpert Xpress SARS-CoV-2/FLU/RSV testing.  Fact Sheet for Patients: BloggerCourse.com  Fact Sheet for Healthcare Providers: SeriousBroker.it  This test is not yet approved or cleared by the [place]  FDA and has been authorized for detection and/or diagnosis of SARS-CoV-2 by FDA under an Emergency Use Authorization (EUA). This EUA will remain in effect (meaning this test can be used) for the duration of the COVID-19 declaration under Section 564(b)(1) of the Act, 21 U.S.C. section 360bbb-3(b)(1), unless the authorization is terminated or revoked.     Resp Syncytial Virus by PCR NEGATIVE NEGATIVE Final    Comment: (NOTE) Fact Sheet for Patients: BloggerCourse.com  Fact  Sheet for Healthcare Providers: SeriousBroker.it  This test is not yet approved or cleared by the [place]  FDA and has been authorized for detection and/or diagnosis of SARS-CoV-2 by FDA under an Emergency Use Authorization (EUA). This EUA will remain in effect (meaning this test can be used) for the duration of the COVID-19 declaration under Section 564(b)(1) of the Act, 21 U.S.C. section 360bbb-3(b)(1), unless the authorization is terminated or revoked.  Performed at [facility], [address], [place], [place] [address]   Blood  Culture (routine x 2)     Status: None (Preliminary result)   Collection Time: 02/06/24  2:14 PM   Specimen: BLOOD RIGHT FOREARM  Result Value Ref Range Status   Specimen Description BLOOD RIGHT FOREARM  Final   Special Requests   Final    BOTTLES DRAWN AEROBIC AND ANAEROBIC Blood Culture results may not be optimal due to an inadequate volume of blood received in culture bottles   Culture   Final    NO GROWTH 2 DAYS Performed at [facility], [address] [place], [place] [address]    Report Status PENDING  Incomplete  Blood Culture (routine x 2)     Status: Abnormal (Preliminary result)   Collection Time: 02/06/24  2:49 PM   Specimen: BLOOD  Result Value Ref Range Status   Specimen Description BLOOD RIGHT ANTECUBITAL  Final   Special Requests   Final    BOTTLES DRAWN AEROBIC AND ANAEROBIC Blood Culture adequate volume   Culture  Setup Time   Final    GRAM NEGATIVE RODS IN BOTH AEROBIC AND ANAEROBIC BOTTLES CRITICAL RESULT CALLED TO, READ BACK BY AND VERIFIED WITH: PHARMD G ABBOTT 02/07/2024 @ 0703 BY AB    Culture (A)  Final    PROTEUS MIRABILIS SUSCEPTIBILITIES TO FOLLOW Performed at [facility], [address], [place], [place] [address]    Report Status PENDING  Incomplete  Blood Culture ID Panel (Reflexed)     Status: Abnormal   Collection Time: 02/06/24  2:49 PM  Result Value Ref Range Status   Enterococcus faecalis NOT DETECTED NOT DETECTED Final   Enterococcus Faecium NOT DETECTED NOT DETECTED Final   Listeria monocytogenes NOT DETECTED NOT DETECTED Final   Staphylococcus species NOT DETECTED NOT DETECTED Final   Staphylococcus aureus (BCID) NOT DETECTED NOT DETECTED Final   Staphylococcus epidermidis NOT DETECTED NOT DETECTED Final   Staphylococcus lugdunensis NOT DETECTED NOT DETECTED Final   Streptococcus species NOT DETECTED NOT DETECTED Final   Streptococcus agalactiae NOT DETECTED NOT DETECTED Final   Streptococcus pneumoniae NOT DETECTED NOT  DETECTED Final   Streptococcus pyogenes NOT DETECTED NOT DETECTED Final   A.calcoaceticus-baumannii NOT DETECTED NOT DETECTED Final   Bacteroides fragilis NOT DETECTED NOT DETECTED Final   Enterobacterales DETECTED (A) NOT DETECTED Final    Comment: Enterobacterales represent a large order of gram negative bacteria, not a single organism. CRITICAL RESULT CALLED TO, READ BACK BY AND VERIFIED WITH: PHARMD G ABBOTT 02/07/2024 @ 0703 BY AB    Enterobacter cloacae complex NOT DETECTED NOT DETECTED Final   Escherichia coli NOT DETECTED NOT DETECTED Final   Klebsiella aerogenes NOT DETECTED NOT DETECTED Final   Klebsiella oxytoca NOT DETECTED NOT DETECTED Final   Klebsiella pneumoniae NOT DETECTED NOT DETECTED Final   Proteus species DETECTED (A) NOT DETECTED Final    Comment: CRITICAL RESULT CALLED TO, READ BACK BY AND VERIFIED WITH: PHARMD G ABBOTT 02/07/2024 @ 0703 BY AB  Salmonella species NOT DETECTED NOT DETECTED Final   Serratia marcescens NOT DETECTED NOT DETECTED Final   Haemophilus influenzae NOT DETECTED NOT DETECTED Final   Neisseria meningitidis NOT DETECTED NOT DETECTED Final   Pseudomonas aeruginosa NOT DETECTED NOT DETECTED Final   Stenotrophomonas maltophilia NOT DETECTED NOT DETECTED Final   Candida albicans NOT DETECTED NOT DETECTED Final   Candida auris NOT DETECTED NOT DETECTED Final   Candida glabrata NOT DETECTED NOT DETECTED Final   Candida krusei NOT DETECTED NOT DETECTED Final   Candida parapsilosis NOT DETECTED NOT DETECTED Final   Candida tropicalis NOT DETECTED NOT DETECTED Final   Cryptococcus neoformans/gattii NOT DETECTED NOT DETECTED Final   CTX-M ESBL NOT DETECTED NOT DETECTED Final   Carbapenem resistance IMP NOT DETECTED NOT DETECTED Final   Carbapenem resistance KPC NOT DETECTED NOT DETECTED Final   Carbapenem resistance NDM NOT DETECTED NOT DETECTED Final   Carbapenem resist OXA 48 LIKE NOT DETECTED NOT DETECTED Final   Carbapenem resistance VIM NOT  DETECTED NOT DETECTED Final    Comment: Performed at [facility], [address], [place], [place] [address]  Urine Culture     Status: Abnormal   Collection Time: 02/07/24  1:40 AM   Specimen: Urine, Random  Result Value Ref Range Status   Specimen Description URINE, RANDOM  Final   Special Requests NONE Reflexed from [id number]  Final   Culture (A)  Final    <10,000 COLONIES/mL INSIGNIFICANT GROWTH Performed at [facility], [address], [place], [place] [address]    Report Status 02/08/2024 FINAL  Final         Radiology Studies: CT ANGIO AO+BIFEM W & OR WO CONTRAST Result Date: 02/07/2024 CLINICAL DATA:  Critical limb ischemia, sepsis EXAM: CT ANGIOGRAPHY OF ABDOMINAL AORTA WITH ILIOFEMORAL RUNOFF TECHNIQUE: Multidetector CT imaging of the abdomen, pelvis and lower extremities was performed using the standard protocol during bolus administration of intravenous contrast. Multiplanar CT image reconstructions and MIPs were obtained to evaluate the vascular anatomy. RADIATION DOSE REDUCTION: This exam was performed according to the departmental dose-optimization program which includes automated exposure control, adjustment of the mA and/or kV according to patient size and/or use of iterative reconstruction technique. CONTRAST:  OMNIPAQUE  IOHEXOL  350 MG/ML SOLN COMPARISON:  02/03/2018 FINDINGS: VASCULAR Aorta: There is thrombosis of the infrarenal abdominal aorta just above the level of the aortobifemoral bypass graft. The bypass graft itself is thrombosed. The native aorta and bilateral lower extremity arterial inflow is thrombosed. Celiac: Patent without evidence of aneurysm, dissection, vasculitis or significant stenosis. SMA: Patent without evidence of aneurysm, dissection, vasculitis or significant stenosis. Renals: 50% stenosis of the right renal artery at its origin. Left renal artery is widely patent. Normal vascular morphology. No aneurysm or dissection. IMA: Thrombosed  proximally with reconstitution via the marginal artery RIGHT Lower Extremity Inflow: Thrombosed Outflow: The profundus femoral artery is thrombosed proximally and is reconstituted via the right reconstituted obturator artery. The stented superficial femoral artery remains thrombosed. Runoff: The runoff vasculature of the right lower extremity appears thrombosed without significant reconstitution via the muscular collaterals arising from the profundus. LEFT Lower Extremity Inflow: Thrombosed Outflow: There is weak reconstitution of profundus collaterals via the reconstituted left obturator and superior gluteal artery. Common femoral artery and superficial femoral artery are thrombosed. Runoff: The runoff vasculature of the left lower extremity is thrombosed. No significant reconstitution is identified. Veins: No obvious venous abnormality within the limitations of this arterial phase study. Review of the  MIP images confirms the above findings. NON-VASCULAR Lower chest: Largely excluded Hepatobiliary: No focal liver abnormality is seen within the visualized liver. Status post cholecystectomy. Mild intra and extrahepatic biliary ductal dilation likely represents post cholecystectomy change. Pancreas: Unremarkable Spleen: The visualized portion is unremarkable Adrenals/Urinary Tract: Adrenal glands are unremarkable. Simple cortical cysts are seen within the kidneys bilaterally for which no follow-up imaging is recommended. The kidneys are otherwise unremarkable. Foley catheter balloon seen within a decompressed bladder lumen. Stomach/Bowel: Stomach is within normal limits. Appendix appears normal. No evidence of bowel wall thickening, distention, or inflammatory changes. Mild ascites no free intraperitoneal gas. Lymphatic: No pathologic adenopathy within the abdomen and pelvis. Reproductive: Prostate gland is normal in size. There is a 14 mm hypoattenuating nodule within the left lobe of the prostate gland which is not  well characterized on this examination. This may represent a malignant nodule or a area focal prostatitis. Other: Mild diffuse subcutaneous body edema. Musculoskeletal: Osseous structures are age-appropriate. No acute bone abnormality. IMPRESSION: 1. Thrombosis of the infrarenal abdominal aorta just above the level of the aortobifemoral bypass graft. The bypass graft itself is thrombosed. The native aorta and bilateral lower extremity arterial inflow is thrombosed. 2. Weak reconstitution of the profundus femoral vasculature bilaterally, right greater than left, via reconstituted internal iliac collaterals. Common femoral and superficial femoral arteries are thrombosed. 3. Thrombosis of the runoff vasculature of the lower extremities bilaterally without significant reconstitution. 4. 50% stenosis of the right renal artery at its origin. 5. Anasarca with mild ascites and subcutaneous body wall edema. 6. 14 mm hypoattenuating nodule within the left lobe of the prostate gland. Correlation with serum PSA level may be helpful for further management. Aortic Atherosclerosis (ICD10-I70.0). Electronically Signed   By: Dorethia Molt M.D.   On: 02/07/2024 20:07   DG Foot Complete Right Result Date: 02/06/2024 CLINICAL DATA:  Foot wounds, sepsis. EXAM: RIGHT FOOT COMPLETE - 3+ VIEW COMPARISON:  None Available. FINDINGS: There is no evidence of fracture or dislocation. No erosion or bony destructive change. Hammertoe deformity of the digits. Osteoarthritis of the first metatarsal phalangeal joint. There is thinning of the soft tissues posterior to the calcaneus. No soft tissue gas or radiopaque foreign body. IMPRESSION: 1. Thinning of the soft tissues posterior to the calcaneus. No radiographic findings of osteomyelitis. 2. Osteoarthritis of the first metatarsophalangeal joint. Electronically Signed   By: Andrea Gasman M.D.   On: 02/06/2024 15:51   DG Foot Complete Left Result Date: 02/06/2024 CLINICAL DATA:  Sepsis,  foot wounds. EXAM: LEFT FOOT - COMPLETE 3+ VIEW COMPARISON:  None Available. FINDINGS: There is no evidence of fracture or dislocation. No erosive or bony destructive change. The site of wound is not well demonstrated by radiograph. No radiopaque foreign body or evidence of soft tissue gas. IMPRESSION: No radiographic findings of osteomyelitis. The site of wound is not well demonstrated by radiograph. Electronically Signed   By: Andrea Gasman M.D.   On: 02/06/2024 15:50   DG Chest Port 1 View Result Date: 02/06/2024 CLINICAL DATA:  Fever. EXAM: PORTABLE CHEST 1 VIEW COMPARISON:  10/25/2023 FINDINGS: The cardiomediastinal contours are normal. Mild hazy right perihilar opacity. Pulmonary vasculature is normal. No pleural effusion or pneumothorax. No acute osseous abnormalities are seen. IMPRESSION: Mild hazy right perihilar opacity may represent pneumonia in the appropriate clinical setting. Electronically Signed   By: Andrea Gasman M.D.   On: 02/06/2024 15:49   CT ABDOMEN PELVIS WO CONTRAST Result Date: 02/06/2024 CLINICAL DATA:  Sepsis, mental  status change. EXAM: CT ABDOMEN AND PELVIS WITHOUT CONTRAST TECHNIQUE: Multidetector CT imaging of the abdomen and pelvis was performed following the standard protocol without IV contrast. RADIATION DOSE REDUCTION: This exam was performed according to the departmental dose-optimization program which includes automated exposure control, adjustment of the mA and/or kV according to patient size and/or use of iterative reconstruction technique. COMPARISON:  01/18/2019. FINDINGS: Lower chest: Dependent atelectasis in the right lower lobe. Endobronchial debris and peribronchovascular nodularity in the left lower lobe. Heart is at the upper limits of normal in size. Decreased attenuation of the intravascular compartment is indicative of anemia. No pericardial or pleural effusion. Distal esophagus is grossly unremarkable. Hepatobiliary: Small hepatic cysts. No specific  follow-up necessary. Liver is otherwise grossly unremarkable. Cholecystectomy. No unexpected biliary ductal dilatation. Pancreas: Negative. Spleen: Negative. Adrenals/Urinary Tract: Adrenal glands are unremarkable. Low-attenuation and subcentimeter hyperdense lesions in the kidneys. No specific follow-up necessary. Scarring in the lower pole left kidney. Tiny right renal stones. Ureters are decompressed. Granular stone debris in the right posterolateral bladder. There may be minimal bladder wall thickening. Foley catheter balloon is inflated in the posterior urethra. Stomach/Bowel: Stomach, small bowel and colon are unremarkable. Appendix is not readily visualized. Vascular/Lymphatic: Aortobifemoral arterial bypass graft. Atherosclerotic calcification of the aorta. No pathologically enlarged lymph nodes. Reproductive: Prostate is mildly prominent. Other: No free fluid. Musculoskeletal: Osteopenia.  Degenerative changes in the spine. IMPRESSION: 1. Endobronchial debris and peribronchovascular nodularity in the left lower lobe, indicative of aspiration. 2. Tiny right renal stones. Granular stone debris layers in the right posterolateral bladder. Possible minimal bladder wall thickening. 3. Foley catheter balloon is inflated in the posterior urethra. 4.  Aortic atherosclerosis (ICD10-I70.0). Electronically Signed   By: Newell Eke M.D.   On: 02/06/2024 15:18   CT Head Wo Contrast Result Date: 02/06/2024 CLINICAL DATA:  Altered mental status. EXAM: CT HEAD WITHOUT CONTRAST TECHNIQUE: Contiguous axial images were obtained from the base of the skull through the vertex without intravenous contrast. RADIATION DOSE REDUCTION: This exam was performed according to the departmental dose-optimization program which includes automated exposure control, adjustment of the mA and/or kV according to patient size and/or use of iterative reconstruction technique. COMPARISON:  10/05/2023 FINDINGS: Brain: No evidence of acute  infarction, hemorrhage, hydrocephalus, extra-axial collection or mass lesion/mass effect. Minimal chronic ischemic microvascular disease. Vascular: No hyperdense vessel or unexpected calcification. Skull: Normal. Negative for fracture or focal lesion. Sinuses/Orbits: No acute finding. Other: None. IMPRESSION: 1. No acute findings. 2. Minimal chronic ischemic microvascular disease. Electronically Signed   By: Toribio Agreste M.D.   On: 02/06/2024 15:17        Scheduled Meds:  (feeding supplement) PROSource Plus  30 mL Oral Daily   ascorbic acid   500 mg Oral QHS   bisacodyl   10 mg Rectal Daily   Chlorhexidine  Gluconate Cloth  6 each Topical Daily   dantrolene   100 mg Oral BID   diclofenac  Sodium  2 g Topical QID   docusate sodium   200 mg Oral Daily   DULoxetine   20 mg Oral Daily   enoxaparin  (LOVENOX ) injection  40 mg Subcutaneous Daily   ferrous sulfate   325 mg Oral Q breakfast   insulin  aspart  0-15 Units Subcutaneous TID WC   insulin  aspart  0-5 Units Subcutaneous QHS   lidocaine   1 patch Transdermal Q24H   metoprolol  succinate  12.5 mg Oral Daily   midodrine   7.5 mg Oral TID WC   multivitamin with minerals  1 tablet Oral Daily  pantoprazole   40 mg Oral BID   pregabalin   50 mg Oral BID   rosuvastatin   20 mg Oral QPM   senna-docusate  1 tablet Oral BID   tamsulosin   0.4 mg Oral QPC supper   Continuous Infusions:  cefTRIAXone  (ROCEPHIN )  IV Stopped (02/07/24 2250)     LOS: 2 days    Time spent: 52 minutes    Renato Applebaum, MD [facility]

## 2024-02-09 DIAGNOSIS — A419 Sepsis, unspecified organism: Secondary | ICD-10-CM | POA: Diagnosis not present

## 2024-02-09 DIAGNOSIS — J189 Pneumonia, unspecified organism: Secondary | ICD-10-CM | POA: Diagnosis not present

## 2024-02-09 DIAGNOSIS — T82858A Stenosis of vascular prosthetic devices, implants and grafts, initial encounter: Secondary | ICD-10-CM

## 2024-02-09 DIAGNOSIS — I70223 Atherosclerosis of native arteries of extremities with rest pain, bilateral legs: Secondary | ICD-10-CM

## 2024-02-09 LAB — GLUCOSE, CAPILLARY
Glucose-Capillary: 124 mg/dL — ABNORMAL HIGH (ref 70–99)
Glucose-Capillary: 114 mg/dL — ABNORMAL HIGH (ref 70–99)
Glucose-Capillary: 151 mg/dL — ABNORMAL HIGH (ref 70–99)
Glucose-Capillary: 93 mg/dL (ref 70–99)
Glucose-Capillary: 94 mg/dL (ref 70–99)

## 2024-02-09 LAB — CULTURE, BLOOD (ROUTINE X 2): Special Requests: ADEQUATE

## 2024-02-09 LAB — BASIC METABOLIC PANEL WITH GFR
Anion gap: 11 (ref 5–15)
BUN: 32 mg/dL — ABNORMAL HIGH (ref 8–23)
CO2: 26 mmol/L (ref 22–32)
Calcium: 7.7 mg/dL — ABNORMAL LOW (ref 8.9–10.3)
Chloride: 99 mmol/L (ref 98–111)
Creatinine, Ser: 1.07 mg/dL (ref 0.61–1.24)
GFR, Estimated: >60 mL/min
Glucose, Bld: 141 mg/dL — ABNORMAL HIGH (ref 70–99)
Potassium: 3.7 mmol/L (ref 3.5–5.1)
Sodium: 136 mmol/L (ref 135–145)

## 2024-02-09 LAB — CBC WITH DIFFERENTIAL/PLATELET
Abs Immature Granulocytes: 0.11 K/uL — ABNORMAL HIGH (ref 0.00–0.07)
Basophils Absolute: 0 K/uL (ref 0.0–0.1)
Basophils Relative: 0 %
Eosinophils Absolute: 0 K/uL (ref 0.0–0.5)
Eosinophils Relative: 0 %
HCT: 26.2 % — ABNORMAL LOW (ref 39.0–52.0)
Hemoglobin: 8.5 g/dL — ABNORMAL LOW (ref 13.0–17.0)
Immature Granulocytes: 1 %
Lymphocytes Relative: 4 %
Lymphs Abs: 0.6 K/uL — ABNORMAL LOW (ref 0.7–4.0)
MCH: 28.4 pg (ref 26.0–34.0)
MCHC: 32.4 g/dL (ref 30.0–36.0)
MCV: 87.6 fL (ref 80.0–100.0)
Monocytes Absolute: 0.4 K/uL (ref 0.1–1.0)
Monocytes Relative: 3 %
Neutro Abs: 12.9 K/uL — ABNORMAL HIGH (ref 1.7–7.7)
Neutrophils Relative %: 92 %
Platelets: 183 K/uL (ref 150–400)
RBC: 2.99 MIL/uL — ABNORMAL LOW (ref 4.22–5.81)
RDW: 14.6 % (ref 11.5–15.5)
WBC: 14 K/uL — ABNORMAL HIGH (ref 4.0–10.5)
nRBC: 0 % (ref 0.0–0.2)

## 2024-02-09 LAB — MAGNESIUM: Magnesium: 1.9 mg/dL (ref 1.7–2.4)

## 2024-02-09 MED ORDER — AMOXICILLIN-POT CLAVULANATE 500-125 MG PO TABS
1.0000 | ORAL_TABLET | Freq: Two times a day (BID) | ORAL | Status: DC
  Administered 2024-02-09 – 2024-02-10 (×6): 1 via ORAL
  Filled 2024-02-09 (×5): qty 1

## 2024-02-09 NOTE — Progress Notes (Signed)
 Speech Language Pathology Treatment: Dysphagia  Patient Details Name: Christian Bailey MRN: [id number] DOB: Feb 25, 1941 Today's Date: 02/09/2024 Time: 8966-8957 SLP Time Calculation (min) (ACUTE ONLY): 9 min  Assessment / Plan / Recommendation Clinical Impression  Pt see for follow up SLP session to assess diet tolerance following diet downgrade to mechanical soft consistency yesterday. Pt denied concerns for inability to masticate soft solids or needing to expectorate soft solids. Of note, he is not the most reliable historian and there is no family at bedside to report on diet tolerance. He consumed a cracker and thin liquids with efficient oral prep and transit and no overt or subtle s/s of aspiration noted. Multiple, audible swallows were noted with sips of thin liquids.   Recommend continue mechanical soft diet and thin liquids as tolerated. SLP will plan to follow up x1 more time to assess diet tolerance. Can consider MBSS if there are diet tolerance concerns and if it aligns with pt's goals of care.  Palliative care is following.   Continue SLP PoC.    HPI HPI: 83 year old male with DM2, HTN, anemia of chronic disease, PAD, comes in from [facility] with weakness and shortness of breath, was also found to be altered. He was febrile on admission to 102.1, and on further evaluation was found to have sepsis due to pneumonia and possible sacral and heel decubitus ulcer infection.   Patient has chronic indwelling Foley catheter, decubitus ulcers.  Chest x-ray showed left perihilar infiltrate.  CT scan shows endobronchial debris.  Recent extensive hospitalizations, severe peripheral vascular disease and declining health.      SLP Plan  Continue with current plan of care          Recommendations  Diet recommendations: Dysphagia 3 (mechanical soft);Thin liquid Medication Administration: Whole meds with liquid Compensations: Slow rate;Small sips/bites Postural Changes and/or  Swallow Maneuvers: Seated upright 90 degrees                  Oral care BID   Frequent or constant Supervision/Assistance Dysphagia, unspecified (R13.10);Dysphagia, oral phase (R13.11)     Continue with current plan of care     Christian Bailey  02/09/2024, 11:10 AM

## 2024-02-09 NOTE — Progress Notes (Addendum)
  Progress Note    02/09/2024 8:44 AM * No surgery found *  Subjective:  pt sleeping   Vitals:   02/08/24 2328 02/09/24 0300  BP: (!) 141/87 125/60  Pulse: 87 90  Resp: 20 20  Temp: 97.6 F (36.4 C) 97.8 F (36.6 C)  SpO2: 96% 97%   Physical Exam: Cardiac:  regular Lungs:  non labored Extremities:  BLE cold to palpation, left mottled to mid thigh, right mottled to knee Neurologic: sleeping  CBC    Component Value Date/Time   WBC 14.0 (H) 02/09/2024 0308   RBC 2.99 (L) 02/09/2024 0308   HGB 8.5 (L) 02/09/2024 0308   HCT 26.2 (L) 02/09/2024 0308   PLT 183 02/09/2024 0308   MCV 87.6 02/09/2024 0308   MCH 28.4 02/09/2024 0308   MCHC 32.4 02/09/2024 0308   RDW 14.6 02/09/2024 0308   LYMPHSABS 0.6 (L) 02/09/2024 0308   MONOABS 0.4 02/09/2024 0308   EOSABS 0.0 02/09/2024 0308   BASOSABS 0.0 02/09/2024 0308    BMET    Component Value Date/Time   NA 136 02/09/2024 0308   K 3.7 02/09/2024 0308   CL 99 02/09/2024 0308   CO2 26 02/09/2024 0308   GLUCOSE 141 (H) 02/09/2024 0308   BUN 32 (H) 02/09/2024 0308   CREATININE 1.07 02/09/2024 0308   CREATININE 1.33 (H) 04/10/2016 0851   CALCIUM  7.7 (L) 02/09/2024 0308   GFRNONAA >60 02/09/2024 0308   GFRAA 52 (L) 01/23/2019 0249    INR    Component Value Date/Time   INR 1.2 02/07/2024 0451     Intake/Output Summary (Last 24 hours) at 02/09/2024 0844 Last data filed at 02/09/2024 0546 Gross per 24 hour  Intake --  Output 1300 ml  Net -1300 ml     Assessment/Plan:  83 y.o. male unfortunately with occluded aortobifemoral bypass graft with critical limb ischemia bilaterally. He would need extensive revascularization to open the aortobifemoral bypass with subsequent bilateral above knee amputations. This has been discussed with patient and his wife and son. Palliative has been asked to see to discuss GOC. Appreciate Hospitalist and Palliative assistance. Family to have more discussions with patient about GOC. Vascular  will follow   Christian Bailey [facility] [phone] 02/09/2024 8:44 AM  VASCULAR STAFF ADDENDUM: I have independently interviewed and examined the patient. I agree with the above.  I have had several conversations with the family regarding Christian Bailey's situation.  I was on the phone for an hour last night, and we had a family meeting today around 12 noon.  I reemphasized that moving forward with surgery or pursuing comfort measures is his choice.  I am very concerned that even with successful revascularization, he will have difficulty with left above-knee amputation healing.  He will also require a right above-knee amputation at some point.  We discussed that quality of life is what ever he perceives it to be, however I think that he would be spending the majority of his final days in the hospital.  Will continue to follow.  Available should any questions or concerns arise.  Appreciate all teams involved in his care.  Fonda FORBES Rim MD [facility] Phone Number: [phone] 02/09/2024 6:38 PM

## 2024-02-09 NOTE — Progress Notes (Signed)
 Palliative Medicine Inpatient Follow Up Note HPI: 83 y.o. male with PMH significant for HTN, HLD, poorly controlled Type II DM, CKD III and PAD who was admitted  from [facility] with AMS and SOB.  Identified to be septics from pneumonia. Palliative care has been asked to support additional goals of care conversations in the setting of healthcare decisions   Today's Discussion 02/09/2024  *Please note that this is a verbal dictation therefore any spelling or grammatical errors are due to the Dragon Medical One system interpretation.  Chart reviewed inclusive of vital signs, progress notes, laboratory results, and diagnostic images.   A meeting was held this afternoon with Ozil, his wife, [name], and two sons Toribio and Evalene.   They patient and his family had just met with the vascular surgeon, Dr. Silver.The options moving forward were explained to him if he should choose to pursue amputation versus if he should not choose to pursue amputation.  We discussed if amputation is not elected the idea of comfort care and inpatient hospice. We talked about transition to comfort measures in house and what that would entail inclusive of medications to control pain, dyspnea, agitation, nausea, itching, and hiccups.  We discussed stopping all uneccessary measures such as cardiac monitoring, blood draws, needle sticks, and frequent vital signs.   Created space and opportunity for patient to explore thoughts feelings and fears regarding current medical situation. Although he does get confused it appears in conversation he does share the desire to be comfortable.   Patients family plan to speak this evening and have a decision in the oncoming day(s).  [facility] liaison plans to meet with family as they are interested in knowing more about services at inpatient hospice and [facility] is closest to their home.   Questions and concerns addressed/Palliative Support Provided.   Objective  Assessment: Vital Signs Vitals:   02/09/24 1058 02/09/24 1059  BP: 133/70 133/70  Pulse:  (!) 104  Resp: 13 18  Temp: 98 F (36.7 C)   SpO2:  97%    Intake/Output Summary (Last 24 hours) at 02/09/2024 1405 Last data filed at 02/09/2024 1103 Gross per 24 hour  Intake --  Output 1950 ml  Net -1950 ml   Last Weight  Most recent update: 02/07/2024  6:41 AM    Weight  57.5 kg (126 lb 12.2 oz)            Gen: Elderly Caucasian chronically ill-appearing male HEENT: moist mucous membranes CV: Regular rate and rhythm PULM: On room air breathing is even and unlabored ABD: soft/nontender EXT: Absent bilateral pedal pulses, cool, white Neuro: Alert and oriented x2-3 forgetful  SUMMARY OF RECOMMENDATIONS   DNAR/DNI  Open and honest conversations held with patient and his family regarding options moving forward  Discussed comfort care as well as inpatient hospice should patient choose to not pursue amputation  Allowing time for family and patient to discuss options  Appreciate [facility] liaison meeting with family to discuss [facility] inpatient hospice  Ongoing Palliative care support ______________________________________________________________________________________ Rosaline Becton [facility] Palliative Medicine Team Team Cell Phone: [phone] Please utilize secure chat with additional questions, if there is no response within 30 minutes please call the above phone number  Time Spent: 65 Billing based on MDM: High   Palliative Medicine Team providers are available by phone from 7am to 7pm daily and can be reached through the team cell phone.  Should this patient require assistance outside of these hours, please call the  patient's attending physician.

## 2024-02-09 NOTE — Progress Notes (Signed)
 [facility] [phone] [facility]  Hospice hospital liaison note   Received request from [facility] for family interest in hospice inpatient unit. Talked with patient's wife [name] to discuss services and hospice philosophy of care and also met at bedside with patient.  [name] stated that they have not made a decision about moving forward with surgery yet. Let her know that we are just providing information at this time in case they need hospice services in the future.   Thank you for the opportunity to participate in this patient's care.    Greig Basket, BSN, RN Hospice hospital liaison [phone]

## 2024-02-09 NOTE — Progress Notes (Signed)
 PROGRESS NOTE    Christian Bailey  FMW:[id number] DOB: [birth year] DOA: 02/06/2024 PCP: Janey Santos, MD    Brief Narrative:  83 year old male with DM2, HTN, anemia of chronic disease, PAD, comes in from [facility] with weakness and shortness of breath, was also found to be altered. He was febrile on admission to 102.1, and on further evaluation was found to have sepsis due to pneumonia and possible sacral and heel decubitus ulcer infection.  Patient has chronic indwelling Foley catheter, decubitus ulcers.  Chest x-ray showed left perihilar infiltrate. CT scan shows endobronchial debris. Recent extensive hospitalizations, severe peripheral vascular disease and declining health.  Subjective: Patient seen in the morning rounds.  Patient is forgetful but today denied any complaints.  He looks comfortable. Seen and examined in the afternoon, he had received a dose of Dilaudid .  Patient gave thumbs up.  2 sons and wife at the bedside.  Assessment & Plan:   Sepsis present on admission secondary to pneumonia, suspected aspiration pneumonia.  Proteus bacteremia:  Presented with leukocytosis, fever and shortness of breath.  Blood culture positive for Proteus bacteremia.  Currently remains on high-dose Rocephin .  Day 3 Rocephin .  Changing to Augmentin  to complete total 7 days of therapy. Suspected aspiration pneumonia.  Speech therapy evaluated the patient.  Now on dysphagia diet. Use incentive spirometer as much as possible but difficult for patient to participate.  Neurogenic bladder: Foley catheter exchanged.  Urine cultures less than 10,000 colonies.  Bacteremia likely secondary to pneumonia.  Peripheral vascular disease: Chronic, occluded and thrombotic bypass graft.  Vascular following.Patient has ischemia of both legs, not salvageable by vascular intervention.  Recommended bilateral above-knee amputation versus palliation.  Hyperlipidemia: On a statin.  CKD stage IIIa: At  about baseline.  Type 2 diabetes: On insulin .    Pressure ulcer present on admission: Local wound care.  Reportedly not infected.  Goal of care:  Remains very debilitated with difficult to treat conditions.  Ischemic bilateral lower leg and no options for revascularization. Multiple medical issues, immobility. Updated patient's family at the bedside.  They are meeting with vascular surgery.  Patient is not a candidate to undergo for revascularization or even bilateral above-knee amputation.  He will not recover from this surgery. I recommended palliation and hospice pathway.      DVT prophylaxis: enoxaparin  (LOVENOX ) injection 40 mg Start: 02/08/24 1000   Code Status: DNR with intervention Family Communication: Wife and 2 sons at the bedside. Disposition Plan: Status is: Inpatient Remains inpatient appropriate because: IV antibiotics, critical limb ischemia.     Consultants:  Vascular surgery Palliative care  Procedures:  None  Antimicrobials:  Rocephin  8/10--- 8/13 Augmentin  8/13--     Objective: Vitals:   02/09/24 0300 02/09/24 0853 02/09/24 1058 02/09/24 1059  BP: 125/60 126/70 133/70 133/70  Pulse: 90 (!) 105  (!) 104  Resp: 20 14 13 18   Temp: 97.8 F (36.6 C) 97.9 F (36.6 C) 98 F (36.7 C)   TempSrc: Oral Oral Oral   SpO2: 97% 98%  97%  Weight:      Height:        Intake/Output Summary (Last 24 hours) at 02/09/2024 1313 Last data filed at 02/09/2024 1103 Gross per 24 hour  Intake --  Output 1950 ml  Net -1950 ml   Filed Weights   02/06/24 1404 02/07/24 0641  Weight: 53.1 kg 57.5 kg    Examination:  General: Chronically sick looking gentleman.  Frail and debilitated. Cardiovascular: S1-S2 normal.  Regular  rate rhythm. Respiratory: Bilateral clear.  No added sounds. Gastrointestinal: Soft.  Nontender.  Bowel sound present.  Foley catheter with clear urine. Ext: No swelling. Neuro: Alert awake and mostly oriented.  Confused at times.   Forgetful.  Gross generalized weakness. Both legs are ice cold below mid thigh with no palpable pulse starting from femoral artery. Discoloration present.      Data Reviewed: I have personally reviewed following labs and imaging studies  CBC: Recent Labs  Lab 02/06/24 1449 02/07/24 0451 02/09/24 0308  WBC 22.2* 22.3* 14.0*  NEUTROABS 22.0*  --  12.9*  HGB 8.1* 8.6* 8.5*  HCT 24.6* 26.1* 26.2*  MCV 88.5 85.9 87.6  PLT 204 174 183   Basic Metabolic Panel: Recent Labs  Lab 02/06/24 1449 02/07/24 0451 02/09/24 0308  NA 134* 136 136  K 4.6 4.3 3.7  CL 98 101 99  CO2 24 26 26   GLUCOSE 118* 116* 141*  BUN 52* 39* 32*  CREATININE 1.49* 1.14 1.07  CALCIUM  8.0* 7.8* 7.7*  MG  --   --  1.9   GFR: Estimated Creatinine Clearance: 43.3 mL/min (by C-G formula based on SCr of 1.07 mg/dL). Liver Function Tests: Recent Labs  Lab 02/06/24 1449 02/07/24 0451  AST 58* 116*  ALT 24 34  ALKPHOS 107 68  BILITOT 0.6 0.8  PROT 5.4* 5.1*  ALBUMIN  2.0* 1.8*   No results for input(s): LIPASE, AMYLASE in the last 168 hours. No results for input(s): AMMONIA in the last 168 hours. Coagulation Profile: Recent Labs  Lab 02/06/24 1449 02/07/24 0451  INR 1.2 1.2   Cardiac Enzymes: No results for input(s): CKTOTAL, CKMB, CKMBINDEX, TROPONINI in the last 168 hours. BNP (last 3 results) No results for input(s): PROBNP in the last 8760 hours. HbA1C: No results for input(s): HGBA1C in the last 72 hours. CBG: Recent Labs  Lab 02/08/24 1622 02/08/24 2126 02/09/24 0619 02/09/24 0759 02/09/24 1100  GLUCAP 111* 127* 124* 114* 151*   Lipid Profile: No results for input(s): CHOL, HDL, LDLCALC, TRIG, CHOLHDL, LDLDIRECT in the last 72 hours. Thyroid  Function Tests: No results for input(s): TSH, T4TOTAL, FREET4, T3FREE, THYROIDAB in the last 72 hours. Anemia Panel: No results for input(s): VITAMINB12, FOLATE, FERRITIN, TIBC, IRON,  RETICCTPCT in the last 72 hours. Sepsis Labs: Recent Labs  Lab 02/06/24 1510 02/06/24 1752  LATICACIDVEN 3.4* 1.5    Recent Results (from the past 240 hours)  Resp panel by RT-PCR (RSV, Flu A&B, Covid) Anterior Nasal Swab     Status: None   Collection Time: 02/06/24  2:09 PM   Specimen: Anterior Nasal Swab  Result Value Ref Range Status   SARS Coronavirus 2 by RT PCR NEGATIVE NEGATIVE Final   Influenza A by PCR NEGATIVE NEGATIVE Final   Influenza B by PCR NEGATIVE NEGATIVE Final    Comment: (NOTE) The Xpert Xpress SARS-CoV-2/FLU/RSV plus assay is intended as an aid in the diagnosis of influenza from Nasopharyngeal swab specimens and should not be used as a sole basis for treatment. Nasal washings and aspirates are unacceptable for Xpert Xpress SARS-CoV-2/FLU/RSV testing.  Fact Sheet for Patients: BloggerCourse.com  Fact Sheet for Healthcare Providers: SeriousBroker.it  This test is not yet approved or cleared by the [place]  FDA and has been authorized for detection and/or diagnosis of SARS-CoV-2 by FDA under an Emergency Use Authorization (EUA). This EUA will remain in effect (meaning this test can be used) for the duration of the COVID-19 declaration under Section 564(b)(1) of the Act,  21 U.S.C. section 360bbb-3(b)(1), unless the authorization is terminated or revoked.     Resp Syncytial Virus by PCR NEGATIVE NEGATIVE Final    Comment: (NOTE) Fact Sheet for Patients: BloggerCourse.com  Fact Sheet for Healthcare Providers: SeriousBroker.it  This test is not yet approved or cleared by the [place]  FDA and has been authorized for detection and/or diagnosis of SARS-CoV-2 by FDA under an Emergency Use Authorization (EUA). This EUA will remain in effect (meaning this test can be used) for the duration of the COVID-19 declaration under Section 564(b)(1) of the  Act, 21 U.S.C. section 360bbb-3(b)(1), unless the authorization is terminated or revoked.  Performed at [facility], [address], [place], [place] [address]   Blood Culture (routine x 2)     Status: None (Preliminary result)   Collection Time: 02/06/24  2:14 PM   Specimen: BLOOD RIGHT FOREARM  Result Value Ref Range Status   Specimen Description BLOOD RIGHT FOREARM  Final   Special Requests   Final    BOTTLES DRAWN AEROBIC AND ANAEROBIC Blood Culture results may not be optimal due to an inadequate volume of blood received in culture bottles   Culture   Final    NO GROWTH 3 DAYS Performed at [facility], [address] [place], [place] [address]    Report Status PENDING  Incomplete  Blood Culture (routine x 2)     Status: Abnormal   Collection Time: 02/06/24  2:49 PM   Specimen: BLOOD  Result Value Ref Range Status   Specimen Description BLOOD RIGHT ANTECUBITAL  Final   Special Requests   Final    BOTTLES DRAWN AEROBIC AND ANAEROBIC Blood Culture adequate volume   Culture  Setup Time   Final    GRAM NEGATIVE RODS IN BOTH AEROBIC AND ANAEROBIC BOTTLES CRITICAL RESULT CALLED TO, READ BACK BY AND VERIFIED WITH: PHARMD G ABBOTT 02/07/2024 @ 0703 BY AB Performed at [facility], [address], [place], [place] [address]    Culture PROTEUS MIRABILIS (A)  Final   Report Status 02/09/2024 FINAL  Final   Organism ID, Bacteria PROTEUS MIRABILIS  Final      Susceptibility   Proteus mirabilis - MIC*    AMPICILLIN <=2 SENSITIVE Sensitive     CEFAZOLIN  Value in next row Intermediate      4 INTERMEDIATEThis is a modified FDA-approved test that has been validated and its performance characteristics determined by the reporting laboratory.  This laboratory is certified under the Clinical Laboratory Improvement Amendments CLIA as qualified to perform high complexity clinical laboratory testing.    CEFEPIME  Value in next row Sensitive      4 INTERMEDIATEThis is a modified  FDA-approved test that has been validated and its performance characteristics determined by the reporting laboratory.  This laboratory is certified under the Clinical Laboratory Improvement Amendments CLIA as qualified to perform high complexity clinical laboratory testing.    ERTAPENEM Value in next row Sensitive      4 INTERMEDIATEThis is a modified FDA-approved test that has been validated and its performance characteristics determined by the reporting laboratory.  This laboratory is certified under the Clinical Laboratory Improvement Amendments CLIA as qualified to perform high complexity clinical laboratory testing.    CEFTRIAXONE  Value in next row Sensitive      4 INTERMEDIATEThis is a modified FDA-approved test that has been validated and its performance characteristics determined by the reporting laboratory.  This laboratory is certified under the Clinical Laboratory Improvement  Amendments CLIA as qualified to perform high complexity clinical laboratory testing.    CIPROFLOXACIN Value in next row Sensitive      4 INTERMEDIATEThis is a modified FDA-approved test that has been validated and its performance characteristics determined by the reporting laboratory.  This laboratory is certified under the Clinical Laboratory Improvement Amendments CLIA as qualified to perform high complexity clinical laboratory testing.    GENTAMICIN Value in next row Sensitive      4 INTERMEDIATEThis is a modified FDA-approved test that has been validated and its performance characteristics determined by the reporting laboratory.  This laboratory is certified under the Clinical Laboratory Improvement Amendments CLIA as qualified to perform high complexity clinical laboratory testing.    MEROPENEM Value in next row Sensitive      4 INTERMEDIATEThis is a modified FDA-approved test that has been validated and its performance characteristics determined by the reporting laboratory.  This laboratory is certified under the  Clinical Laboratory Improvement Amendments CLIA as qualified to perform high complexity clinical laboratory testing.    TRIMETH/SULFA Value in next row Sensitive      4 INTERMEDIATEThis is a modified FDA-approved test that has been validated and its performance characteristics determined by the reporting laboratory.  This laboratory is certified under the Clinical Laboratory Improvement Amendments CLIA as qualified to perform high complexity clinical laboratory testing.    AMPICILLIN/SULBACTAM Value in next row Sensitive      4 INTERMEDIATEThis is a modified FDA-approved test that has been validated and its performance characteristics determined by the reporting laboratory.  This laboratory is certified under the Clinical Laboratory Improvement Amendments CLIA as qualified to perform high complexity clinical laboratory testing.    PIP/TAZO Value in next row Sensitive ug/mL     <=4 SENSITIVEThis is a modified FDA-approved test that has been validated and its performance characteristics determined by the reporting laboratory.  This laboratory is certified under the Clinical Laboratory Improvement Amendments CLIA as qualified to perform high complexity clinical laboratory testing.    * PROTEUS MIRABILIS  Blood Culture ID Panel (Reflexed)     Status: Abnormal   Collection Time: 02/06/24  2:49 PM  Result Value Ref Range Status   Enterococcus faecalis NOT DETECTED NOT DETECTED Final   Enterococcus Faecium NOT DETECTED NOT DETECTED Final   Listeria monocytogenes NOT DETECTED NOT DETECTED Final   Staphylococcus species NOT DETECTED NOT DETECTED Final   Staphylococcus aureus (BCID) NOT DETECTED NOT DETECTED Final   Staphylococcus epidermidis NOT DETECTED NOT DETECTED Final   Staphylococcus lugdunensis NOT DETECTED NOT DETECTED Final   Streptococcus species NOT DETECTED NOT DETECTED Final   Streptococcus agalactiae NOT DETECTED NOT DETECTED Final   Streptococcus pneumoniae NOT DETECTED NOT DETECTED Final    Streptococcus pyogenes NOT DETECTED NOT DETECTED Final   A.calcoaceticus-baumannii NOT DETECTED NOT DETECTED Final   Bacteroides fragilis NOT DETECTED NOT DETECTED Final   Enterobacterales DETECTED (A) NOT DETECTED Final    Comment: Enterobacterales represent a large order of gram negative bacteria, not a single organism. CRITICAL RESULT CALLED TO, READ BACK BY AND VERIFIED WITH: PHARMD G ABBOTT 02/07/2024 @ 0703 BY AB    Enterobacter cloacae complex NOT DETECTED NOT DETECTED Final   Escherichia coli NOT DETECTED NOT DETECTED Final   Klebsiella aerogenes NOT DETECTED NOT DETECTED Final   Klebsiella oxytoca NOT DETECTED NOT DETECTED Final   Klebsiella pneumoniae NOT DETECTED NOT DETECTED Final   Proteus species DETECTED (A) NOT DETECTED Final    Comment: CRITICAL RESULT CALLED TO, READ  BACK BY AND VERIFIED WITH: PHARMD G ABBOTT 02/07/2024 @ 0703 BY AB    Salmonella species NOT DETECTED NOT DETECTED Final   Serratia marcescens NOT DETECTED NOT DETECTED Final   Haemophilus influenzae NOT DETECTED NOT DETECTED Final   Neisseria meningitidis NOT DETECTED NOT DETECTED Final   Pseudomonas aeruginosa NOT DETECTED NOT DETECTED Final   Stenotrophomonas maltophilia NOT DETECTED NOT DETECTED Final   Candida albicans NOT DETECTED NOT DETECTED Final   Candida auris NOT DETECTED NOT DETECTED Final   Candida glabrata NOT DETECTED NOT DETECTED Final   Candida krusei NOT DETECTED NOT DETECTED Final   Candida parapsilosis NOT DETECTED NOT DETECTED Final   Candida tropicalis NOT DETECTED NOT DETECTED Final   Cryptococcus neoformans/gattii NOT DETECTED NOT DETECTED Final   CTX-M ESBL NOT DETECTED NOT DETECTED Final   Carbapenem resistance IMP NOT DETECTED NOT DETECTED Final   Carbapenem resistance KPC NOT DETECTED NOT DETECTED Final   Carbapenem resistance NDM NOT DETECTED NOT DETECTED Final   Carbapenem resist OXA 48 LIKE NOT DETECTED NOT DETECTED Final   Carbapenem resistance VIM NOT DETECTED NOT  DETECTED Final    Comment: Performed at [facility], [address] [place], [place] [address]  Urine Culture     Status: Abnormal   Collection Time: 02/07/24  1:40 AM   Specimen: Urine, Random  Result Value Ref Range Status   Specimen Description URINE, RANDOM  Final   Special Requests NONE Reflexed from [id number]  Final   Culture (A)  Final    <10,000 COLONIES/mL INSIGNIFICANT GROWTH Performed at [facility], [address], [place], [place] [address]    Report Status 02/08/2024 FINAL  Final         Radiology Studies: CT ANGIO AO+BIFEM W & OR WO CONTRAST Result Date: 02/07/2024 CLINICAL DATA:  Critical limb ischemia, sepsis EXAM: CT ANGIOGRAPHY OF ABDOMINAL AORTA WITH ILIOFEMORAL RUNOFF TECHNIQUE: Multidetector CT imaging of the abdomen, pelvis and lower extremities was performed using the standard protocol during bolus administration of intravenous contrast. Multiplanar CT image reconstructions and MIPs were obtained to evaluate the vascular anatomy. RADIATION DOSE REDUCTION: This exam was performed according to the departmental dose-optimization program which includes automated exposure control, adjustment of the mA and/or kV according to patient size and/or use of iterative reconstruction technique. CONTRAST:  OMNIPAQUE  IOHEXOL  350 MG/ML SOLN COMPARISON:  02/03/2018 FINDINGS: VASCULAR Aorta: There is thrombosis of the infrarenal abdominal aorta just above the level of the aortobifemoral bypass graft. The bypass graft itself is thrombosed. The native aorta and bilateral lower extremity arterial inflow is thrombosed. Celiac: Patent without evidence of aneurysm, dissection, vasculitis or significant stenosis. SMA: Patent without evidence of aneurysm, dissection, vasculitis or significant stenosis. Renals: 50% stenosis of the right renal artery at its origin. Left renal artery is widely patent. Normal vascular morphology. No aneurysm or dissection. IMA: Thrombosed proximally  with reconstitution via the marginal artery RIGHT Lower Extremity Inflow: Thrombosed Outflow: The profundus femoral artery is thrombosed proximally and is reconstituted via the right reconstituted obturator artery. The stented superficial femoral artery remains thrombosed. Runoff: The runoff vasculature of the right lower extremity appears thrombosed without significant reconstitution via the muscular collaterals arising from the profundus. LEFT Lower Extremity Inflow: Thrombosed Outflow: There is weak reconstitution of profundus collaterals via the reconstituted left obturator and superior gluteal artery. Common femoral artery and superficial femoral artery are thrombosed. Runoff: The runoff vasculature of the left lower extremity is thrombosed. No significant reconstitution is identified.  Veins: No obvious venous abnormality within the limitations of this arterial phase study. Review of the MIP images confirms the above findings. NON-VASCULAR Lower chest: Largely excluded Hepatobiliary: No focal liver abnormality is seen within the visualized liver. Status post cholecystectomy. Mild intra and extrahepatic biliary ductal dilation likely represents post cholecystectomy change. Pancreas: Unremarkable Spleen: The visualized portion is unremarkable Adrenals/Urinary Tract: Adrenal glands are unremarkable. Simple cortical cysts are seen within the kidneys bilaterally for which no follow-up imaging is recommended. The kidneys are otherwise unremarkable. Foley catheter balloon seen within a decompressed bladder lumen. Stomach/Bowel: Stomach is within normal limits. Appendix appears normal. No evidence of bowel wall thickening, distention, or inflammatory changes. Mild ascites no free intraperitoneal gas. Lymphatic: No pathologic adenopathy within the abdomen and pelvis. Reproductive: Prostate gland is normal in size. There is a 14 mm hypoattenuating nodule within the left lobe of the prostate gland which is not well  characterized on this examination. This may represent a malignant nodule or a area focal prostatitis. Other: Mild diffuse subcutaneous body edema. Musculoskeletal: Osseous structures are age-appropriate. No acute bone abnormality. IMPRESSION: 1. Thrombosis of the infrarenal abdominal aorta just above the level of the aortobifemoral bypass graft. The bypass graft itself is thrombosed. The native aorta and bilateral lower extremity arterial inflow is thrombosed. 2. Weak reconstitution of the profundus femoral vasculature bilaterally, right greater than left, via reconstituted internal iliac collaterals. Common femoral and superficial femoral arteries are thrombosed. 3. Thrombosis of the runoff vasculature of the lower extremities bilaterally without significant reconstitution. 4. 50% stenosis of the right renal artery at its origin. 5. Anasarca with mild ascites and subcutaneous body wall edema. 6. 14 mm hypoattenuating nodule within the left lobe of the prostate gland. Correlation with serum PSA level may be helpful for further management. Aortic Atherosclerosis (ICD10-I70.0). Electronically Signed   By: Dorethia Molt M.D.   On: 02/07/2024 20:07        Scheduled Meds:  (feeding supplement) PROSource Plus  30 mL Oral Daily   amoxicillin -clavulanate  1 tablet Oral Q12H   ascorbic acid   500 mg Oral QHS   bisacodyl   10 mg Rectal Daily   Chlorhexidine  Gluconate Cloth  6 each Topical Daily   dantrolene   100 mg Oral BID   diclofenac  Sodium  2 g Topical QID   docusate sodium   200 mg Oral Daily   DULoxetine   20 mg Oral Daily   enoxaparin  (LOVENOX ) injection  40 mg Subcutaneous Daily   ferrous sulfate   325 mg Oral Q breakfast   insulin  aspart  0-15 Units Subcutaneous TID WC   insulin  aspart  0-5 Units Subcutaneous QHS   lidocaine   1 patch Transdermal Q24H   metoprolol  succinate  12.5 mg Oral Daily   midodrine   7.5 mg Oral TID WC   multivitamin with minerals  1 tablet Oral Daily   pantoprazole   40 mg  Oral BID   pregabalin   50 mg Oral BID   rosuvastatin   20 mg Oral QPM   senna-docusate  1 tablet Oral BID   tamsulosin   0.4 mg Oral QPC supper   Continuous Infusions:     LOS: 3 days    Time spent: 52 minutes    Renato Applebaum, MD [facility]

## 2024-02-09 NOTE — TOC Initial Note (Signed)
 Transition of Care (TOC) - Initial/Assessment Note  Rayfield Gobble RN, BSN Inpatient Care Management Unit 4E- RN Case Manager See Treatment Team for direct phone #   Patient Details  Name: Christian Bailey MRN: [id number] Date of Birth: December 22, 1940  Transition of Care [facility]) CM/SW Contact:    Gobble Rayfield Hurst, RN Phone Number: 02/09/2024, 2:40 PM  Clinical Narrative:                 Pt admitted from [facility],  prior to from home with wife.   Referral received from [facility]- regarding possible [employer]. Family questing further information on [employer] and comfort care.   CM spoke with wife at bedside- confirmed that they want to have more info provided on [facility] vs [facility] Hospice facility [facility] Wife voiced they feel [facility] is closer to home. Wife agreeable to have [facility] liaison reach out to speak with her.  Wife's cell # - [phone]  Notified [facility] liaison that wife requesting to speak with someone regarding possible comfort care and [facility]- [facility] liaison to follow up with wife.     Expected Discharge Plan: Skilled Nursing Facility Barriers to Discharge: Continued Medical Work up   Patient Goals and CMS Choice     Choice offered to / list presented to : Spouse      Expected Discharge Plan and Services In-house Referral: Clinical Social Work Discharge Planning Services: CM Consult Post Acute Care Choice: Hospice Living arrangements for the past 2 months: Skilled Nursing Facility                                      Prior Living Arrangements/Services Living arrangements for the past 2 months: Skilled Nursing Facility Lives with:: Facility Resident Patient language and need for interpreter reviewed:: Yes        Need for Family Participation in Patient Care: Yes (Comment) Care giver support system in place?: Yes (comment)   Criminal Activity/Legal Involvement Pertinent to Current  Situation/Hospitalization: No - Comment as needed  Activities of Daily Living      Permission Sought/Granted Permission sought to share information with : Facility Industrial/product designer granted to share information with : Yes, Verbal Permission Granted     Permission granted to share info w AGENCY: [facility]        Emotional Assessment Appearance:: Appears stated age Attitude/Demeanor/Rapport: Unable to Assess Affect (typically observed): Quiet   Alcohol  / Substance Use: Not Applicable Psych Involvement: No (comment)  Admission diagnosis:  Sepsis due to pneumonia (HCC) [J18.9, A41.9] Patient Active Problem List   Diagnosis Date Noted   Sepsis due to pneumonia (HCC) 02/06/2024   Pressure injury of deep tissue of foot 11/23/2023   Pressure injury of deep tissue of buttock 11/23/2023   Coping style affecting medical condition 11/11/2023   Protein-calorie malnutrition, severe 11/01/2023   Adjustment disorder with mixed anxiety and depressed mood 10/28/2023   Depression with anxiety 10/25/2023   Urinary retention 10/22/2023   Neurogenic bladder 10/22/2023   Transient iatrogenic urethral bleeding 10/22/2023   Acute incomplete quadriplegia (HCC) 10/19/2023   Spinal cord compression (HCC) 10/18/2023   Unsteady gait 10/18/2023   Neck pain 10/18/2023   Cervical myelopathy (HCC) 10/18/2023   Delirium due to another medical condition, acute, hyperactive 10/09/2023   Intractable back pain 10/03/2023   Fall 10/02/2023   Acute renal failure superimposed on stage 3b chronic  kidney disease, unspecified acute renal failure type (HCC) 05/07/2023   Leukocytosis 05/07/2023   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) 03/25/2020   Exudative age-related macular degeneration of right eye with inactive choroidal neovascularization (HCC) 03/25/2020   Advanced nonexudative age-related macular degeneration of right eye with  subfoveal involvement 03/25/2020   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 involvement 03/25/2020   Bacteremia due to Gram-negative bacteria 01/19/2019   CKD (chronic kidney disease) stage 3, GFR 30-59 ml/min (HCC) 01/19/2019   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) 07/06/2018   S/P shoulder replacement, right 12/07/2016   PVD (peripheral vascular disease) (HCC) 09/18/2016   Sinus tachycardia 05/15/2014   Carotid artery disease (HCC) 04/26/2014   PAD (peripheral artery disease) (HCC) 06/12/2013   RBBB (right bundle branch block with left anterior fascicular block) 06/12/2013   Claudication (HCC) 12/14/2012   Essential hypertension 12/14/2012   Hyperlipidemia 12/14/2012   Type 2 diabetes mellitus (HCC) 12/14/2012   History of colonic polyps 01/25/2012   DM 08/11/2010   Acute duodenal ulcer with hemorrhage 08/11/2010   PCP:  Janey Santos, MD Pharmacy:   [facility] DELIVERY - [place], [place] - [address] [place] [address] Phone: [phone] Fax: [phone]     Social Drivers of Health (SDOH) Social History: SDOH Screenings   Food Insecurity: Patient Unable To Answer (02/07/2024)  Housing: Unknown (02/07/2024)  Transportation Needs: Patient Unable To Answer (02/07/2024)  Utilities: Patient Unable To Answer (02/07/2024)  Social Connections: Unknown (02/07/2024)  Tobacco Use: Medium Risk (02/06/2024)   SDOH Interventions:     Readmission Risk Interventions     No data to display

## 2024-02-10 DIAGNOSIS — J189 Pneumonia, unspecified organism: Secondary | ICD-10-CM | POA: Diagnosis not present

## 2024-02-10 DIAGNOSIS — A419 Sepsis, unspecified organism: Secondary | ICD-10-CM | POA: Diagnosis not present

## 2024-02-10 LAB — COMPREHENSIVE METABOLIC PANEL WITH GFR
ALT: 32 U/L (ref 0–44)
AST: 76 U/L — ABNORMAL HIGH (ref 15–41)
Albumin: 1.9 g/dL — ABNORMAL LOW (ref 3.5–5.0)
Alkaline Phosphatase: 68 U/L (ref 38–126)
Anion gap: 9 (ref 5–15)
BUN: 24 mg/dL — ABNORMAL HIGH (ref 8–23)
CO2: 26 mmol/L (ref 22–32)
Calcium: 8 mg/dL — ABNORMAL LOW (ref 8.9–10.3)
Chloride: 102 mmol/L (ref 98–111)
Creatinine, Ser: 1 mg/dL (ref 0.61–1.24)
GFR, Estimated: >60 mL/min (ref >60)
Glucose, Bld: 95 mg/dL (ref 70–99)
Potassium: 3.8 mmol/L (ref 3.5–5.1)
Sodium: 137 mmol/L (ref 135–145)
Total Bilirubin: 0.7 mg/dL (ref 0.0–1.2)
Total Protein: 5.1 g/dL — ABNORMAL LOW (ref 6.5–8.1)

## 2024-02-10 LAB — GLUCOSE, CAPILLARY
Glucose-Capillary: 92 mg/dL (ref 70–99)
Glucose-Capillary: 73 mg/dL (ref 70–99)
Glucose-Capillary: 104 mg/dL — ABNORMAL HIGH (ref 70–99)
Glucose-Capillary: 198 mg/dL — ABNORMAL HIGH (ref 70–99)
Glucose-Capillary: 152 mg/dL — ABNORMAL HIGH (ref 70–99)

## 2024-02-10 LAB — CBC WITH DIFFERENTIAL/PLATELET
Abs Immature Granulocytes: 0.11 K/uL — ABNORMAL HIGH (ref 0.00–0.07)
Basophils Absolute: 0 K/uL (ref 0.0–0.1)
Basophils Relative: 0 %
Eosinophils Absolute: 0.1 K/uL (ref 0.0–0.5)
Eosinophils Relative: 1 %
HCT: 29 % — ABNORMAL LOW (ref 39.0–52.0)
Hemoglobin: 9.1 g/dL — ABNORMAL LOW (ref 13.0–17.0)
Immature Granulocytes: 1 %
Lymphocytes Relative: 7 %
Lymphs Abs: 0.8 K/uL (ref 0.7–4.0)
MCH: 27.7 pg (ref 26.0–34.0)
MCHC: 31.4 g/dL (ref 30.0–36.0)
MCV: 88.1 fL (ref 80.0–100.0)
Monocytes Absolute: 0.6 K/uL (ref 0.1–1.0)
Monocytes Relative: 6 %
Neutro Abs: 9.3 K/uL — ABNORMAL HIGH (ref 1.7–7.7)
Neutrophils Relative %: 85 %
Platelets: 198 K/uL (ref 150–400)
RBC: 3.29 MIL/uL — ABNORMAL LOW (ref 4.22–5.81)
RDW: 14.3 % (ref 11.5–15.5)
WBC: 10.9 K/uL — ABNORMAL HIGH (ref 4.0–10.5)
nRBC: 0 % (ref 0.0–0.2)

## 2024-02-10 MED ORDER — MIDAZOLAM HCL 2 MG/2ML IJ SOLN
1.0000 mg | INTRAMUSCULAR | Status: AC
  Administered 2024-02-10: 1 mg via INTRAVENOUS

## 2024-02-10 MED ORDER — MIDAZOLAM HCL 2 MG/2ML IJ SOLN: 0.5000 mg | INTRAMUSCULAR | Status: DC | PRN

## 2024-02-10 MED ORDER — HALOPERIDOL 1 MG PO TABS
2.0000 mg | ORAL_TABLET | ORAL | Status: DC | PRN
Start: 2024-02-10 — End: 2024-02-10

## 2024-02-10 MED ORDER — HYDROMORPHONE HCL 1 MG/ML IJ SOLN
INTRAMUSCULAR | Status: AC
  Filled 2024-02-10: qty 1

## 2024-02-10 MED ORDER — HYDROMORPHONE HCL-NACL 50-0.9 MG/50ML-% IV SOLN
0.5000 mg/h | INTRAVENOUS | Status: DC
  Administered 2024-02-10: 0.5 mg/h via INTRAVENOUS
  Filled 2024-02-10: qty 50

## 2024-02-10 MED ORDER — MIDAZOLAM HCL 2 MG/2ML IJ SOLN
1.0000 mg | INTRAMUSCULAR | Status: DC | PRN
  Filled 2024-02-10: qty 2

## 2024-02-10 MED ORDER — ACETAMINOPHEN 500 MG PO TABS
1000.0000 mg | ORAL_TABLET | Freq: Three times a day (TID) | ORAL | Status: DC
  Administered 2024-02-10 – 2024-02-13 (×4): 1000 mg via ORAL
  Filled 2024-02-10 (×4): qty 2

## 2024-02-10 MED ORDER — HYDROMORPHONE HCL 1 MG/ML IJ SOLN
1.0000 mg | Freq: Once | INTRAMUSCULAR | Status: AC
  Administered 2024-02-10: 1 mg via INTRAVENOUS

## 2024-02-10 MED ORDER — TRAMADOL HCL 50 MG PO TABS: 50.0000 mg | ORAL_TABLET | Freq: Four times a day (QID) | ORAL | Status: DC | PRN

## 2024-02-10 MED ORDER — HALOPERIDOL LACTATE 5 MG/ML IJ SOLN
2.0000 mg | INTRAMUSCULAR | Status: DC | PRN
Start: 2024-02-10 — End: 2024-02-10

## 2024-02-10 MED ORDER — BIOTENE DRY MOUTH MT LIQD
15.0000 mL | Freq: Three times a day (TID) | OROMUCOSAL | Status: DC
  Administered 2024-02-10 (×2): 15 mL via TOPICAL

## 2024-02-10 MED ORDER — HYDROMORPHONE HCL 1 MG/ML IJ SOLN
0.5000 mg | INTRAMUSCULAR | Status: DC | PRN
  Administered 2024-02-10 – 2024-02-11 (×2): 0.5 mg via INTRAVENOUS
  Administered 2024-02-11: 1 mg via INTRAVENOUS
  Administered 2024-02-11 (×2): 0.5 mg via INTRAVENOUS
  Administered 2024-02-12 (×3): 1 mg via INTRAVENOUS
  Filled 2024-02-10 (×4): qty 0.5
  Filled 2024-02-10 (×3): qty 1
  Filled 2024-02-10: qty 0.5
  Filled 2024-02-10: qty 1

## 2024-02-10 MED ORDER — HALOPERIDOL LACTATE 2 MG/ML PO CONC
2.0000 mg | ORAL | Status: DC | PRN
Start: 2024-02-10 — End: 2024-02-10

## 2024-02-10 MED ORDER — TRAMADOL HCL 50 MG PO TABS: 25.0000 mg | ORAL_TABLET | Freq: Four times a day (QID) | ORAL | Status: DC | PRN

## 2024-02-10 MED ORDER — POLYVINYL ALCOHOL 1.4 % OP SOLN: 1.0000 [drp] | Freq: Four times a day (QID) | OPHTHALMIC | Status: DC | PRN

## 2024-02-10 MED ORDER — HYDROMORPHONE HCL 1 MG/ML PO LIQD: 1.0000 mg | Freq: Once | ORAL | Status: DC

## 2024-02-10 MED ORDER — HYDROMORPHONE BOLUS VIA INFUSION: 0.5000 mg | INTRAVENOUS | Status: DC | PRN

## 2024-02-10 NOTE — Progress Notes (Signed)
 MC [id number] [facility]  Hospice hospital liaison note   Received request from [facility] for family interest in hospice inpatient unit. Talked with patient's wife and son at bedside to discuss services and hospice philosophy of care. They would like to move forward with [facility] referral at this time.   We will plan to assess the patient tfor [facility] tomorrow morning.   Thank you for the opportunity to participate in this patient's care.    Eleanor Nail, LPN Hospice hospital liaison [phone]

## 2024-02-10 NOTE — Progress Notes (Signed)
 47 ml Dilaudid  drip wasted in stericycle.  Witnessed by [employer].  Unable to waste in pyxis

## 2024-02-10 NOTE — TOC Progression Note (Signed)
 Transition of Care (TOC) - Progression Note  Rayfield Gobble RN, BSN Inpatient Care Management Unit 4E- RN Case Manager See Treatment Team for direct phone #   Patient Details  Name: Christian Bailey MRN: [id number] Date of Birth: Jul 07, 1940  Transition of Care [facility]) CM/SW Contact  Gobble, Rayfield Hurst, RN Phone Number: 02/10/2024, 3:12 PM  Clinical Narrative:    PC has spoken with pt/family- they have decided on no surgery and want to move forward with comfort care and hospice.  [facility] liaison updated and voiced that they have a mtg already set w/ wife for 3pm to further discuss possible [employer].    Expected Discharge Plan: Hospice Medical Facility Barriers to Discharge: Continued Medical Work up               Expected Discharge Plan and Services In-house Referral: Clinical Social Work Discharge Planning Services: CM Consult Post Acute Care Choice: Hospice Living arrangements for the past 2 months: Skilled Nursing Facility                                       Social Drivers of Health (SDOH) Interventions SDOH Screenings   Food Insecurity: Patient Unable To Answer (02/07/2024)  Housing: Unknown (02/07/2024)  Transportation Needs: Patient Unable To Answer (02/07/2024)  Utilities: Patient Unable To Answer (02/07/2024)  Social Connections: Unknown (02/07/2024)  Tobacco Use: Medium Risk (02/06/2024)    Readmission Risk Interventions     No data to display

## 2024-02-10 NOTE — Progress Notes (Addendum)
  Progress Note    02/10/2024 8:16 AM * No surgery found *  Subjective:  pt sleeping but woke easily, comfortable. States he really is unsure of what all is going on   Vitals:   02/10/24 0338 02/10/24 0343  BP: 118/73 (!) 125/56  Pulse: (!) 102 98  Resp: 20 20  Temp: 97.7 F (36.5 C) 97.8 F (36.6 C)  SpO2: 95% 96%   Physical Exam: Cardiac:  regular Lungs:  non labored Extremities:  BLE remain cold to palpation. Mottling > LLE than right Neurologic: alert and oriented to self  CBC    Component Value Date/Time   WBC 10.9 (H) 02/10/2024 0336   RBC 3.29 (L) 02/10/2024 0336   HGB 9.1 (L) 02/10/2024 0336   HCT 29.0 (L) 02/10/2024 0336   PLT 198 02/10/2024 0336   MCV 88.1 02/10/2024 0336   MCH 27.7 02/10/2024 0336   MCHC 31.4 02/10/2024 0336   RDW 14.3 02/10/2024 0336   LYMPHSABS 0.8 02/10/2024 0336   MONOABS 0.6 02/10/2024 0336   EOSABS 0.1 02/10/2024 0336   BASOSABS 0.0 02/10/2024 0336    BMET    Component Value Date/Time   NA 137 02/10/2024 0336   K 3.8 02/10/2024 0336   CL 102 02/10/2024 0336   CO2 26 02/10/2024 0336   GLUCOSE 95 02/10/2024 0336   BUN 24 (H) 02/10/2024 0336   CREATININE 1.00 02/10/2024 0336   CREATININE 1.33 (H) 04/10/2016 0851   CALCIUM  8.0 (L) 02/10/2024 0336   GFRNONAA >60 02/10/2024 0336   GFRAA 52 (L) 01/23/2019 0249    INR    Component Value Date/Time   INR 1.2 02/07/2024 0451     Intake/Output Summary (Last 24 hours) at 02/10/2024 0816 Last data filed at 02/10/2024 0350 Gross per 24 hour  Intake --  Output 1950 ml  Net -1950 ml     Assessment/Plan:  83 y.o. male with occluded aortobifemoral bypass graft with BLE limb ischemia. Unfortunately a very difficult situation. Appreciate TRH and Palliative assistance. Multiple discussions ongoing regarding GOC and plan for comfort care/ Hospice vs. Surgery. Patient seems to be not fully able to comprehend what is going on. Family is not present this morning. We will continue to  follow     Teretha Damme, PA-C [facility] [phone] 02/10/2024 8:16 AM  VASCULAR STAFF ADDENDUM: I have independently interviewed and examined the patient. I agree with the above.  Remains unsure which route he would like to take.  He is aware that his legs will continue to worsen.  I am very concerned that longer we wait, the sicker he will become.  At some point, I would not offer the surgery. I think palliative care is the best avenue at this time.   Fonda FORBES Rim MD [facility] Phone Number: [phone] 02/10/2024 8:29 AM

## 2024-02-10 NOTE — Progress Notes (Addendum)
 Daily Progress Note   Patient Name: Christian Bailey       Date: 02/10/2024 DOB: [birth year]  Age: 83 y.o. MRN#: [id number] Attending Physician: Christian Ligas, DO Primary Care Physician: Christian Santos, MD Admit Date: 02/06/2024  Reason for Consultation/Follow-up: Establishing goals of care  Subjective: Moaning in pain - reports severe pain; wife at bedside reports agitation as well  Length of Stay: 4  Current Medications: Scheduled Meds:   amoxicillin -clavulanate  1 tablet Oral Q12H   antiseptic oral rinse  15 mL Topical TID   bisacodyl   10 mg Rectal Daily   dantrolene   100 mg Oral BID   diclofenac  Sodium  2 g Topical QID   docusate sodium   200 mg Oral Daily   lidocaine   1 patch Transdermal Q24H   metoprolol  succinate  12.5 mg Oral Daily   midodrine   7.5 mg Oral TID WC   pantoprazole   40 mg Oral BID   pregabalin   50 mg Oral BID   senna-docusate  1 tablet Oral BID   tamsulosin   0.4 mg Oral QPC supper    Continuous Infusions:  HYDROmorphone       PRN Meds: acetaminophen  **OR** acetaminophen , artificial tears, fluticasone , haloperidol  **OR** haloperidol  **OR** haloperidol  lactate, HYDROmorphone , HYDROmorphone  (DILAUDID ) injection, midazolam , ondansetron  **OR** ondansetron  (ZOFRAN ) IV, simethicone   Physical Exam Constitutional:      General: He is not in acute distress.    Appearance: He is ill-appearing.     Comments: Responds to voice  Pulmonary:     Effort: Pulmonary effort is normal.  Musculoskeletal:     Comments: BLE pale and cold  Skin:    General: Skin is warm and dry.  Neurological:     Comments: Intermittent confusion  Psychiatric:     Comments: restless             Vital Signs: BP (!) 128/57 (BP Location: Left Arm)   Pulse (!) 110   Temp 98.3 F (36.8 C) (Oral)   Resp  10   Ht 5' 4 (1.626 m)   Wt 57.5 kg   SpO2 (!) 87%   BMI 21.76 kg/m  SpO2: SpO2: (!) 87 % O2 Device: O2 Device: Room Air O2 Flow Rate:    Intake/output summary:  Intake/Output Summary (Last 24 hours) at 02/10/2024 1705 Last data filed at 02/10/2024 0350 Gross per 24 hour  Intake --  Output 1300 ml  Net -1300 ml   LBM: Last BM Date : 02/09/24 Baseline Weight: Weight: 53.1 kg Most recent weight: Weight: 57.5 kg       Palliative Assessment/Data:PPS 30%      Patient Active Problem List   Diagnosis Date Noted   Sepsis due to pneumonia (HCC) 02/06/2024   Pressure injury of deep tissue of foot 11/23/2023   Pressure injury of deep tissue of buttock 11/23/2023   Coping style affecting medical condition 11/11/2023   Protein-calorie malnutrition, severe 11/01/2023   Adjustment disorder with mixed anxiety and depressed mood 10/28/2023   Depression with anxiety 10/25/2023   Urinary retention 10/22/2023   Neurogenic bladder 10/22/2023   Transient iatrogenic urethral bleeding 10/22/2023   Acute incomplete quadriplegia (HCC) 10/19/2023   Spinal cord compression (HCC) 10/18/2023   Unsteady gait  10/18/2023   Neck pain 10/18/2023   Cervical myelopathy (HCC) 10/18/2023   Delirium due to another medical condition, acute, hyperactive 10/09/2023   Intractable back pain 10/03/2023   Fall 10/02/2023   Acute renal failure superimposed on stage 3b chronic kidney disease, unspecified acute renal failure type (HCC) 05/07/2023   Leukocytosis 05/07/2023   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) 03/25/2020   Exudative age-related macular degeneration of right eye with inactive choroidal neovascularization (HCC) 03/25/2020   Advanced nonexudative age-related macular degeneration of right eye with subfoveal involvement 03/25/2020   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 involvement 03/25/2020    Bacteremia due to Gram-negative bacteria 01/19/2019   CKD (chronic kidney disease) stage 3, GFR 30-59 ml/min (HCC) 01/19/2019   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) 07/06/2018   S/P shoulder replacement, right 12/07/2016   PVD (peripheral vascular disease) (HCC) 09/18/2016   Sinus tachycardia 05/15/2014   Carotid artery disease (HCC) 04/26/2014   PAD (peripheral artery disease) (HCC) 06/12/2013   RBBB (right bundle branch block with left anterior fascicular block) 06/12/2013   Claudication (HCC) 12/14/2012   Essential hypertension 12/14/2012   Hyperlipidemia 12/14/2012   Type 2 diabetes mellitus (HCC) 12/14/2012   History of colonic polyps 01/25/2012   DM 08/11/2010   Acute duodenal ulcer with hemorrhage 08/11/2010    Palliative Care Assessment & Plan   HPI: 83 y.o. male with PMH significant for HTN, HLD, poorly controlled Type II DM, CKD III and PAD who was admitted  from [facility] with AMS and SOB.  Identified to be septics from pneumonia. Palliative care has been asked to support additional goals of care conversations in the setting of healthcare decisions    Assessment: Follow up today with patient and wife at bedside, later joined by son Christian Bailey.   Patient is moaning and agitated when I initially enter room. I share my concern that patient appears very uncomfortable and wife shares it is actually better than it was before - he received 1 mg dilaudid  just prior to my arrival and prior to that his agony was much worse.   Wife shares patient has made decision not to proceed with surgery. We discuss the alternative to surgery is to focus on comfort. She agrees. We discuss comfort measures only - not continuing any medical interventions not needed to promote comfort. She agrees.   Review with wife that amount of medication needed to promote comfort may cause drowsiness/sleeping - she understands and reports she would rather him be comfortable and drowsy  than continue like he currently is.  Also reviewed this with son who agrees.  We review starting a continuous infusion of dilaudid  to manage symptoms and keep steady dose in his system given severity of pain. They agree. We also review using PRN versed  for agitation/anxiety. They agree to this as well.  Follow up later after patient received versed  - he is sleeping; appears comfortable.   Family is interested in transition to hospice facility. They are meeting with hospice liaisons for next steps.   Addendum: upon visiting patient again this evening, he remains drowsy. He wakes easily to voice and is able to share his pain is well controlled. RN is at bedside as well. In discussion with RN and now that pain is better controlled/patient resting comfortably decision made not to do dilaudid  infusion and use PRNs through night for pain control.    Recommendations/Plan: Comfort measures only -  dc orders not needed to promote comfort One time dose of versed  d/t agitation Dialudid q1hr prn IV push Tylenol  q8hr 1000 mg Awaiting transition to hospice facility  Code Status: DNR  Discharge Planning: Hospice facility  Care plan was discussed with patient, spouse, son  Thank you for allowing the Palliative Medicine Team to assist in the care of this patient.   Total Time 55 minutes Prolonged Time Billed  no   Time spent includes: Detailed review of medical records (labs, imaging, vital signs), medically appropriate exam, discussion with treatment team, counseling and educating patient, family and/or staff, documenting clinical information, medication management and coordination of care.     *Please note that this is a verbal dictation therefore any spelling or grammatical errors are due to the Dragon Medical One system interpretation.  Tobey Jama Barnacle, DNP, [facility] Palliative Medicine Team Team Phone # [phone]  Pager [phone]

## 2024-02-10 NOTE — Plan of Care (Signed)
  Problem: Clinical Measurements: Goal: Will remain free from infection Outcome: Not Progressing Goal: Respiratory complications will improve Outcome: Not Progressing   Problem: Activity: Goal: Risk for activity intolerance will decrease Outcome: Not Progressing   Problem: Nutrition: Goal: Adequate nutrition will be maintained Outcome: Not Progressing

## 2024-02-10 NOTE — Progress Notes (Signed)
 PROGRESS NOTE    Clearence Vitug  FMW:[id number] DOB: 15-Feb-1941 DOA: 02/06/2024 PCP: Janey Santos, MD    Brief Narrative:  83 year old male with DM2, HTN, anemia of chronic disease, PAD, comes in from [facility] with weakness and shortness of breath, was also found to be altered. He was febrile on admission to 102.1, and on further evaluation was found to have sepsis due to pneumonia and possible sacral and heel decubitus ulcer infection.  Patient has chronic indwelling Foley catheter, decubitus ulcers.  Chest x-ray showed left perihilar infiltrate. CT scan shows endobronchial debris. Recent extensive hospitalizations, severe peripheral vascular disease and declining health. Palliative care was consulted. The patient has decided against amputation for now:SABRA The patient and his wife have met with palliative care and have decided for comfort care.  Subjective: The patient is resting comfortably. No new complaints. He states that pain control is adequate.  Assessment & Plan:   Sepsis present on admission secondary to pneumonia, suspected aspiration pneumonia.  Proteus bacteremia. Resolved.   Presented with leukocytosis, fever and shortness of breath.  Blood culture positive for Proteus bacteremia.  Currently remains on high-dose Rocephin .  Day 3 Rocephin .  Changing to Augmentin  to complete total 7 days of therapy. Suspected aspiration pneumonia.  Speech therapy evaluated the patient.  Now on dysphagia diet. Use incentive spirometer as much as possible but difficult for patient to participate.  Neurogenic bladder: Foley catheter exchanged.  Urine cultures less than 10,000 colonies.  Bacteremia likely secondary to pneumonia.  Peripheral vascular disease: Chronic, occluded and thrombotic bypass graft.  Vascular following.Patient has ischemia of both legs, not salvageable by vascular intervention.  Recommended bilateral above-knee amputation versus  palliation.  Hyperlipidemia: On a statin.  CKD stage IIIa: At about baseline.  Type 2 diabetes: On insulin .    Pressure ulcer present on admission: Local wound care.  Reportedly not infected.  Goal of care:  Remains very debilitated with difficult to treat conditions.  Ischemic bilateral lower leg and no options for revascularization. Multiple medical issues, immobility. Updated patient's family at the bedside.  They are meeting with vascular surgery.  Patient is not a candidate to undergo for revascularization or even bilateral above-knee amputation.  He will not recover from this surgery. I recommended palliation and hospice pathway.    DVT prophylaxis:    Code Status: DNR with intervention Family Communication: Wife and 2 sons at the bedside. Disposition Plan: Status is: Inpatient Remains inpatient appropriate because: IV antibiotics, critical limb ischemia.   Consultants:  Vascular surgery Palliative care  Procedures:  None  Antimicrobials:  Rocephin  8/10--- 8/13 Augmentin  8/13--     Objective: Vitals:   02/10/24 1400 02/10/24 1542 02/10/24 1600 02/10/24 1700  BP: (!) 152/78 (!) 128/57    Pulse: (!) 118 (!) 110    Resp: 20 10 11  (!) 9  Temp:  98.3 F (36.8 C)    TempSrc:  Oral    SpO2: (!) 83% (!) 87% (!) 86% 91%  Weight:      Height:        Intake/Output Summary (Last 24 hours) at 02/10/2024 1757 Last data filed at 02/10/2024 0350 Gross per 24 hour  Intake --  Output 1300 ml  Net -1300 ml   Filed Weights   02/06/24 1404 02/07/24 0641  Weight: 53.1 kg 57.5 kg    Examination:  Exam:  Constitutional:  The patient is awake, alert, and oriented x 3. No acute distress. Respiratory:  No increased work of breathing. No wheezes,  rales, or rhonchi No tactile fremitus Cardiovascular:  Regular rate and rhythm No murmurs, ectopy, or gallups. No lateral PMI. No thrills. Abdomen:  Abdomen is soft, non-tender, non-distended No hernias, masses, or  organomegaly Normoactive bowel sounds.  Musculoskeletal:  Lower extremities are cold bilaterally. Skin:  No rashes, lesions, ulcers palpation of skin: no induration or nodules Neurologic:  CN 2-12 intact Sensation all 4 extremities intact Psychiatric:  Mental status Mood, affect appropriate Orientation to person, place, time  judgment and insight appear intact  Data Reviewed: I have personally reviewed following labs and imaging studies  CBC: Recent Labs  Lab 02/06/24 1449 02/07/24 0451 02/09/24 0308 02/10/24 0336  WBC 22.2* 22.3* 14.0* 10.9*  NEUTROABS 22.0*  --  12.9* 9.3*  HGB 8.1* 8.6* 8.5* 9.1*  HCT 24.6* 26.1* 26.2* 29.0*  MCV 88.5 85.9 87.6 88.1  PLT 204 174 183 198   Basic Metabolic Panel: Recent Labs  Lab 02/06/24 1449 02/07/24 0451 02/09/24 0308 02/10/24 0336  NA 134* 136 136 137  K 4.6 4.3 3.7 3.8  CL 98 101 99 102  CO2 24 26 26 26   GLUCOSE 118* 116* 141* 95  BUN 52* 39* 32* 24*  CREATININE 1.49* 1.14 1.07 1.00  CALCIUM  8.0* 7.8* 7.7* 8.0*  MG  --   --  1.9  --    GFR: Estimated Creatinine Clearance: 46.3 mL/min (by C-G formula based on SCr of 1 mg/dL). Liver Function Tests: Recent Labs  Lab 02/06/24 1449 02/07/24 0451 02/10/24 0336  AST 58* 116* 76*  ALT 24 34 32  ALKPHOS 107 68 68  BILITOT 0.6 0.8 0.7  PROT 5.4* 5.1* 5.1*  ALBUMIN  2.0* 1.8* 1.9*   No results for input(s): LIPASE, AMYLASE in the last 168 hours. No results for input(s): AMMONIA in the last 168 hours. Coagulation Profile: Recent Labs  Lab 02/06/24 1449 02/07/24 0451  INR 1.2 1.2   Cardiac Enzymes: No results for input(s): CKTOTAL, CKMB, CKMBINDEX, TROPONINI in the last 168 hours. BNP (last 3 results) No results for input(s): PROBNP in the last 8760 hours. HbA1C: No results for input(s): HGBA1C in the last 72 hours. CBG: Recent Labs  Lab 02/09/24 2118 02/10/24 0621 02/10/24 0822 02/10/24 1156 02/10/24 1541  GLUCAP 94 92 73 104* 198*    Lipid Profile: No results for input(s): CHOL, HDL, LDLCALC, TRIG, CHOLHDL, LDLDIRECT in the last 72 hours. Thyroid  Function Tests: No results for input(s): TSH, T4TOTAL, FREET4, T3FREE, THYROIDAB in the last 72 hours. Anemia Panel: No results for input(s): VITAMINB12, FOLATE, FERRITIN, TIBC, IRON, RETICCTPCT in the last 72 hours. Sepsis Labs: Recent Labs  Lab 02/06/24 1510 02/06/24 1752  LATICACIDVEN 3.4* 1.5    Recent Results (from the past 240 hours)  Resp panel by RT-PCR (RSV, Flu A&B, Covid) Anterior Nasal Swab     Status: None   Collection Time: 02/06/24  2:09 PM   Specimen: Anterior Nasal Swab  Result Value Ref Range Status   SARS Coronavirus 2 by RT PCR NEGATIVE NEGATIVE Final   Influenza A by PCR NEGATIVE NEGATIVE Final   Influenza B by PCR NEGATIVE NEGATIVE Final    Comment: (NOTE) The Xpert Xpress SARS-CoV-2/FLU/RSV plus assay is intended as an aid in the diagnosis of influenza from Nasopharyngeal swab specimens and should not be used as a sole basis for treatment. Nasal washings and aspirates are unacceptable for Xpert Xpress SARS-CoV-2/FLU/RSV testing.  Fact Sheet for Patients: BloggerCourse.com  Fact Sheet for Healthcare Providers: SeriousBroker.it  This test is not yet  approved or cleared by the [place]  FDA and has been authorized for detection and/or diagnosis of SARS-CoV-2 by FDA under an Emergency Use Authorization (EUA). This EUA will remain in effect (meaning this test can be used) for the duration of the COVID-19 declaration under Section 564(b)(1) of the Act, 21 U.S.C. section 360bbb-3(b)(1), unless the authorization is terminated or revoked.     Resp Syncytial Virus by PCR NEGATIVE NEGATIVE Final    Comment: (NOTE) Fact Sheet for Patients: BloggerCourse.com  Fact Sheet for Healthcare  Providers: SeriousBroker.it  This test is not yet approved or cleared by the [place]  FDA and has been authorized for detection and/or diagnosis of SARS-CoV-2 by FDA under an Emergency Use Authorization (EUA). This EUA will remain in effect (meaning this test can be used) for the duration of the COVID-19 declaration under Section 564(b)(1) of the Act, 21 U.S.C. section 360bbb-3(b)(1), unless the authorization is terminated or revoked.  Performed at [facility], [address], [place], [place] [address]   Blood Culture (routine x 2)     Status: None (Preliminary result)   Collection Time: 02/06/24  2:14 PM   Specimen: BLOOD RIGHT FOREARM  Result Value Ref Range Status   Specimen Description BLOOD RIGHT FOREARM  Final   Special Requests   Final    BOTTLES DRAWN AEROBIC AND ANAEROBIC Blood Culture results may not be optimal due to an inadequate volume of blood received in culture bottles   Culture   Final    NO GROWTH 4 DAYS Performed at [facility], [address] [place], [place] [address]    Report Status PENDING  Incomplete  Blood Culture (routine x 2)     Status: Abnormal   Collection Time: 02/06/24  2:49 PM   Specimen: BLOOD  Result Value Ref Range Status   Specimen Description BLOOD RIGHT ANTECUBITAL  Final   Special Requests   Final    BOTTLES DRAWN AEROBIC AND ANAEROBIC Blood Culture adequate volume   Culture  Setup Time   Final    GRAM NEGATIVE RODS IN BOTH AEROBIC AND ANAEROBIC BOTTLES CRITICAL RESULT CALLED TO, READ BACK BY AND VERIFIED WITH: PHARMD G ABBOTT 02/07/2024 @ 0703 BY AB Performed at [facility], [address] [place], [place] [address]    Culture PROTEUS MIRABILIS (A)  Final   Report Status 02/09/2024 FINAL  Final   Organism ID, Bacteria PROTEUS MIRABILIS  Final      Susceptibility   Proteus mirabilis - MIC*    AMPICILLIN <=2 SENSITIVE Sensitive     CEFAZOLIN  Value in next row Intermediate      4  INTERMEDIATEThis is a modified FDA-approved test that has been validated and its performance characteristics determined by the reporting laboratory.  This laboratory is certified under the Clinical Laboratory Improvement Amendments CLIA as qualified to perform high complexity clinical laboratory testing.    CEFEPIME  Value in next row Sensitive      4 INTERMEDIATEThis is a modified FDA-approved test that has been validated and its performance characteristics determined by the reporting laboratory.  This laboratory is certified under the Clinical Laboratory Improvement Amendments CLIA as qualified to perform high complexity clinical laboratory testing.    ERTAPENEM Value in next row Sensitive      4 INTERMEDIATEThis is a modified FDA-approved test that has been validated and its performance characteristics determined by the reporting laboratory.  This laboratory is certified under the Clinical Laboratory Improvement Amendments CLIA as qualified  to perform high complexity clinical laboratory testing.    CEFTRIAXONE  Value in next row Sensitive      4 INTERMEDIATEThis is a modified FDA-approved test that has been validated and its performance characteristics determined by the reporting laboratory.  This laboratory is certified under the Clinical Laboratory Improvement Amendments CLIA as qualified to perform high complexity clinical laboratory testing.    CIPROFLOXACIN Value in next row Sensitive      4 INTERMEDIATEThis is a modified FDA-approved test that has been validated and its performance characteristics determined by the reporting laboratory.  This laboratory is certified under the Clinical Laboratory Improvement Amendments CLIA as qualified to perform high complexity clinical laboratory testing.    GENTAMICIN Value in next row Sensitive      4 INTERMEDIATEThis is a modified FDA-approved test that has been validated and its performance characteristics determined by the reporting laboratory.  This  laboratory is certified under the Clinical Laboratory Improvement Amendments CLIA as qualified to perform high complexity clinical laboratory testing.    MEROPENEM Value in next row Sensitive      4 INTERMEDIATEThis is a modified FDA-approved test that has been validated and its performance characteristics determined by the reporting laboratory.  This laboratory is certified under the Clinical Laboratory Improvement Amendments CLIA as qualified to perform high complexity clinical laboratory testing.    TRIMETH/SULFA Value in next row Sensitive      4 INTERMEDIATEThis is a modified FDA-approved test that has been validated and its performance characteristics determined by the reporting laboratory.  This laboratory is certified under the Clinical Laboratory Improvement Amendments CLIA as qualified to perform high complexity clinical laboratory testing.    AMPICILLIN/SULBACTAM Value in next row Sensitive      4 INTERMEDIATEThis is a modified FDA-approved test that has been validated and its performance characteristics determined by the reporting laboratory.  This laboratory is certified under the Clinical Laboratory Improvement Amendments CLIA as qualified to perform high complexity clinical laboratory testing.    PIP/TAZO Value in next row Sensitive ug/mL     <=4 SENSITIVEThis is a modified FDA-approved test that has been validated and its performance characteristics determined by the reporting laboratory.  This laboratory is certified under the Clinical Laboratory Improvement Amendments CLIA as qualified to perform high complexity clinical laboratory testing.    * PROTEUS MIRABILIS  Blood Culture ID Panel (Reflexed)     Status: Abnormal   Collection Time: 02/06/24  2:49 PM  Result Value Ref Range Status   Enterococcus faecalis NOT DETECTED NOT DETECTED Final   Enterococcus Faecium NOT DETECTED NOT DETECTED Final   Listeria monocytogenes NOT DETECTED NOT DETECTED Final   Staphylococcus species NOT  DETECTED NOT DETECTED Final   Staphylococcus aureus (BCID) NOT DETECTED NOT DETECTED Final   Staphylococcus epidermidis NOT DETECTED NOT DETECTED Final   Staphylococcus lugdunensis NOT DETECTED NOT DETECTED Final   Streptococcus species NOT DETECTED NOT DETECTED Final   Streptococcus agalactiae NOT DETECTED NOT DETECTED Final   Streptococcus pneumoniae NOT DETECTED NOT DETECTED Final   Streptococcus pyogenes NOT DETECTED NOT DETECTED Final   A.calcoaceticus-baumannii NOT DETECTED NOT DETECTED Final   Bacteroides fragilis NOT DETECTED NOT DETECTED Final   Enterobacterales DETECTED (A) NOT DETECTED Final    Comment: Enterobacterales represent a large order of gram negative bacteria, not a single organism. CRITICAL RESULT CALLED TO, READ BACK BY AND VERIFIED WITH: PHARMD G ABBOTT 02/07/2024 @ 0703 BY AB    Enterobacter cloacae complex NOT DETECTED NOT DETECTED Final   Escherichia  coli NOT DETECTED NOT DETECTED Final   Klebsiella aerogenes NOT DETECTED NOT DETECTED Final   Klebsiella oxytoca NOT DETECTED NOT DETECTED Final   Klebsiella pneumoniae NOT DETECTED NOT DETECTED Final   Proteus species DETECTED (A) NOT DETECTED Final    Comment: CRITICAL RESULT CALLED TO, READ BACK BY AND VERIFIED WITH: PHARMD G ABBOTT 02/07/2024 @ 0703 BY AB    Salmonella species NOT DETECTED NOT DETECTED Final   Serratia marcescens NOT DETECTED NOT DETECTED Final   Haemophilus influenzae NOT DETECTED NOT DETECTED Final   Neisseria meningitidis NOT DETECTED NOT DETECTED Final   Pseudomonas aeruginosa NOT DETECTED NOT DETECTED Final   Stenotrophomonas maltophilia NOT DETECTED NOT DETECTED Final   Candida albicans NOT DETECTED NOT DETECTED Final   Candida auris NOT DETECTED NOT DETECTED Final   Candida glabrata NOT DETECTED NOT DETECTED Final   Candida krusei NOT DETECTED NOT DETECTED Final   Candida parapsilosis NOT DETECTED NOT DETECTED Final   Candida tropicalis NOT DETECTED NOT DETECTED Final   Cryptococcus  neoformans/gattii NOT DETECTED NOT DETECTED Final   CTX-M ESBL NOT DETECTED NOT DETECTED Final   Carbapenem resistance IMP NOT DETECTED NOT DETECTED Final   Carbapenem resistance KPC NOT DETECTED NOT DETECTED Final   Carbapenem resistance NDM NOT DETECTED NOT DETECTED Final   Carbapenem resist OXA 48 LIKE NOT DETECTED NOT DETECTED Final   Carbapenem resistance VIM NOT DETECTED NOT DETECTED Final    Comment: Performed at [facility], [address], [place], [place] [address]  Urine Culture     Status: Abnormal   Collection Time: 02/07/24  1:40 AM   Specimen: Urine, Random  Result Value Ref Range Status   Specimen Description URINE, RANDOM  Final   Special Requests NONE Reflexed from [id number]  Final   Culture (A)  Final    <10,000 COLONIES/mL INSIGNIFICANT GROWTH Performed at [facility], [address], [place], [place] [address]    Report Status 02/08/2024 FINAL  Final         Radiology Studies: No results found.       Scheduled Meds:  amoxicillin -clavulanate  1 tablet Oral Q12H   antiseptic oral rinse  15 mL Topical TID   bisacodyl   10 mg Rectal Daily   dantrolene   100 mg Oral BID   diclofenac  Sodium  2 g Topical QID   docusate sodium   200 mg Oral Daily   lidocaine   1 patch Transdermal Q24H   metoprolol  succinate  12.5 mg Oral Daily   midodrine   7.5 mg Oral TID WC   pantoprazole   40 mg Oral BID   pregabalin   50 mg Oral BID   senna-docusate  1 tablet Oral BID   tamsulosin   0.4 mg Oral QPC supper   Continuous Infusions:  HYDROmorphone  0.5 mg/hr (02/10/24 1737)      LOS: 4 days    Time spent: 34 minutes    Sung Parodi, MD [facility]

## 2024-02-11 DIAGNOSIS — Z515 Encounter for palliative care: Secondary | ICD-10-CM

## 2024-02-11 DIAGNOSIS — J189 Pneumonia, unspecified organism: Secondary | ICD-10-CM | POA: Diagnosis not present

## 2024-02-11 DIAGNOSIS — Z7189 Other specified counseling: Secondary | ICD-10-CM | POA: Diagnosis not present

## 2024-02-11 DIAGNOSIS — I70223 Atherosclerosis of native arteries of extremities with rest pain, bilateral legs: Secondary | ICD-10-CM | POA: Diagnosis present

## 2024-02-11 DIAGNOSIS — A419 Sepsis, unspecified organism: Secondary | ICD-10-CM | POA: Diagnosis not present

## 2024-02-11 LAB — CULTURE, BLOOD (ROUTINE X 2): Culture: NO GROWTH

## 2024-02-11 MED ORDER — LORAZEPAM 2 MG/ML PO CONC: 1.0000 mg | ORAL | Status: DC | PRN

## 2024-02-11 MED ORDER — HYDROMORPHONE HCL 1 MG/ML IJ SOLN
0.5000 mg | Freq: Once | INTRAMUSCULAR | Status: AC
  Administered 2024-02-11: 0.5 mg via INTRAVENOUS

## 2024-02-11 MED ORDER — LORAZEPAM 1 MG PO TABS
1.0000 mg | ORAL_TABLET | ORAL | Status: DC | PRN
  Administered 2024-02-11: 1 mg via ORAL
  Filled 2024-02-11: qty 1

## 2024-02-11 NOTE — Assessment & Plan Note (Signed)
 The patient has chosen hospice and comfort care over amputation.

## 2024-02-11 NOTE — Discharge Summary (Signed)
 Physician Discharge Summary   Patient: Christian Bailey MRN: [id number] DOB: [birth year]  Admit date:     02/06/2024  Discharge date: 02/11/24  Discharge Physician: Brigida Bureau   PCP: Avva, Ravisankar, MD   Recommendations at discharge:    Discharge to hospice for end of life care.  Discharge Diagnoses: Principal Problem:   Critical limb ischemia of both lower extremities (HCC) Active Problems:   Essential hypertension   Hyperlipidemia   Type 2 diabetes mellitus (HCC)   PAD (peripheral artery disease) (HCC)   PVD (peripheral vascular disease) (HCC)   CKD (chronic kidney disease) stage 3, GFR 30-59 ml/min (HCC)   Sepsis due to pneumonia [facility])  Resolved Problems:   * No resolved hospital problems. *  Hospital Course: 83 year old male with DM2, HTN, anemia of chronic disease, PAD, comes in from [facility] with weakness and shortness of breath, was also found to be altered. He was febrile on admission to 102.1, and on further evaluation was found to have sepsis due to pneumonia and possible sacral and heel decubitus ulcer infection.  Patient has chronic indwelling Foley catheter, decubitus ulcers.  Chest x-ray showed left perihilar infiltrate. CT scan shows endobronchial debris. Recent extensive hospitalizations, severe peripheral vascular disease and declining health.  The patient has a chronic occluded and thrombotic bypass graft. Vascular surgery was consulted. The legs are not salvageable. The patient and his family were given the choice of bilateral amputation or comfort care. They have chosen comfort care.   He will be discharged to a hospice facility for end of life care.  Assessment and Plan: Sepsis present on admission secondary to pneumonia, suspected aspiration pneumonia.  Proteus bacteremia. Resolved.    Presented with leukocytosis, fever and shortness of breath.  Blood culture positive for Proteus bacteremia.  Currently remains on high-dose Rocephin .  Day 3  Rocephin .  Changing to Augmentin  to complete total 7 days of therapy. Suspected aspiration pneumonia.  Speech therapy evaluated the patient.  Now on dysphagia diet. Use incentive spirometer as much as possible but difficult for patient to participate.   Neurogenic bladder: Foley catheter exchanged.  Urine cultures less than 10,000 colonies.  Bacteremia likely secondary to pneumonia.   Peripheral vascular disease: Chronic, occluded and thrombotic bypass graft.  Vascular following.Patient has ischemia of both legs, not salvageable by vascular intervention.  Recommended bilateral above-knee amputation versus palliation. Patient and his family have chosen for comfort care. The patient will be discharged to hospice facility today.   Hyperlipidemia: On a statin.   CKD stage IIIa: At about baseline.   Type 2 diabetes: On insulin .   Pressure ulcer present on admission: Local wound care.  Reportedly not infected.      Consultants: Vascular surgery, Palliative care Procedures performed: None  Disposition: Hospice care Diet recommendation:  Discharge Diet Orders (From admission, onward)     Start     Ordered   02/11/24 0000  Diet general        02/11/24 1648           Regular diet DISCHARGE MEDICATION: Allergies as of 02/11/2024       Reactions   Baclofen  Other (See Comments)   Severe delirium on Baclofen  and Flexeril    Flexeril  [cyclobenzaprine ] Other (See Comments)   Confusion/delirium   Asa [aspirin ] Other (See Comments)   GI bleeding   Gadolinium Derivatives Itching, Swelling, Other (See Comments)   Pt complained of face flushing/hottness and throat tightness/scratchiness immediately after the injections.  Within 4 minutes, all symptoms  were gone and the study was completed.  No further complications or signs of allergy were exhibited after completion of study.    Iodinated Contrast Media Other (See Comments)   Pt does not recall reaction   Pneumococcal 13-val Conj Vacc     Other Reaction(s): achiness all over, dizziness, nausea, weakness   Scopolamine     Other Reaction(s): Delerium   Oxycodone -acetaminophen  Other (See Comments)   Dizziness and feeling of being uncomfortable         Medication List     STOP taking these medications    (feeding supplement) PROSource Plus liquid   acetaminophen  325 MG tablet Commonly known as: TYLENOL    apixaban  2.5 MG Tabs tablet Commonly known as: ELIQUIS    ascorbic acid  500 MG tablet Commonly known as: VITAMIN C    bisacodyl  10 MG suppository Commonly known as: DULCOLAX   cadexomer iodine  0.9 % gel Commonly known as: IODOSORB   dantrolene  100 MG capsule Commonly known as: DANTRIUM    diclofenac  Sodium 1 % Gel Commonly known as: VOLTAREN    docusate sodium  100 MG capsule Commonly known as: COLACE   DULoxetine  20 MG capsule Commonly known as: CYMBALTA    feeding supplement Liqd   ferrous sulfate  325 (65 FE) MG tablet   fluticasone  50 MCG/ACT nasal spray Commonly known as: FLONASE    Gerhardt's butt cream Crea   lidocaine  4 %   linagliptin  5 MG Tabs tablet Commonly known as: TRADJENTA    melatonin 5 MG Tabs   menthol -cetylpyridinium 3 MG lozenge Commonly known as: CEPACOL   metoprolol  succinate 25 MG 24 hr tablet Commonly known as: TOPROL -XL   midodrine  2.5 MG tablet Commonly known as: PROAMATINE    multivitamin with minerals Tabs tablet   Muscle Rub 10-15 % Crea   NON FORMULARY   pantoprazole  40 MG tablet Commonly known as: PROTONIX    pregabalin  50 MG capsule Commonly known as: LYRICA    rosuvastatin  20 MG tablet Commonly known as: CRESTOR    senna-docusate 8.6-50 MG tablet Commonly known as: Senokot-S   simethicone  80 MG chewable tablet Commonly known as: MYLICON   tamsulosin  0.4 MG Caps capsule Commonly known as: FLOMAX    traMADol  50 MG tablet Commonly known as: ULTRAM                Discharge Care Instructions  (From admission, onward)           Start      Ordered   02/11/24 0000  Discharge wound care:       Comments: 1 Sacrum cleanse with normal saline, reapply sacrum foam, change every 3 days, date dressing, assess every shift #2 Right and Left Heels: cleanse normal saline, pat dry and reapply 4x4 foam dressing, reapply boots, change every 3 days   02/11/24 1648            Discharge Exam: Filed Weights   02/06/24 1404 02/07/24 0641  Weight: 53.1 kg 57.5 kg   Exam:  Constitutional:  The patient is awake, alert, and oriented x 3. No acute distress. Eyes:  pupils and irises appear normal Normal lids and conjunctivae ENMT:  grossly normal hearing  Lips appear normal external ears, nose appear normal Oropharynx: mucosa, tongue,posterior pharynx appear normal Neck:  neck appears normal, no masses, normal ROM, supple no thyromegaly Respiratory:  No increased work of breathing. No wheezes, rales, or rhonchi No tactile fremitus Cardiovascular:  Regular rate and rhythm No murmurs, ectopy, or gallups. No lateral PMI. No thrills. Abdomen:  Abdomen is soft, non-tender, non-distended No hernias,  masses, or organomegaly Normoactive bowel sounds.  Musculoskeletal:  No cyanosis, clubbing, or edema Lower extremities are cold bilaterally. Skin:  No rashes, lesions, ulcers palpation of skin: no induration or nodules Neurologic:  CN 2-12 intact Sensation all 4 extremities intact Psychiatric:  Mental status Mood, affect appropriate Orientation to person, place, time  judgment and insight appear intact   Condition at discharge: poor  The results of significant diagnostics from this hospitalization (including imaging, microbiology, ancillary and laboratory) are listed below for reference.   Imaging Studies: CT ANGIO AO+BIFEM W & OR WO CONTRAST Result Date: 02/07/2024 CLINICAL DATA:  Critical limb ischemia, sepsis EXAM: CT ANGIOGRAPHY OF ABDOMINAL AORTA WITH ILIOFEMORAL RUNOFF TECHNIQUE: Multidetector CT imaging of the  abdomen, pelvis and lower extremities was performed using the standard protocol during bolus administration of intravenous contrast. Multiplanar CT image reconstructions and MIPs were obtained to evaluate the vascular anatomy. RADIATION DOSE REDUCTION: This exam was performed according to the departmental dose-optimization program which includes automated exposure control, adjustment of the mA and/or kV according to patient size and/or use of iterative reconstruction technique. CONTRAST:  OMNIPAQUE  IOHEXOL  350 MG/ML SOLN COMPARISON:  02/03/2018 FINDINGS: VASCULAR Aorta: There is thrombosis of the infrarenal abdominal aorta just above the level of the aortobifemoral bypass graft. The bypass graft itself is thrombosed. The native aorta and bilateral lower extremity arterial inflow is thrombosed. Celiac: Patent without evidence of aneurysm, dissection, vasculitis or significant stenosis. SMA: Patent without evidence of aneurysm, dissection, vasculitis or significant stenosis. Renals: 50% stenosis of the right renal artery at its origin. Left renal artery is widely patent. Normal vascular morphology. No aneurysm or dissection. IMA: Thrombosed proximally with reconstitution via the marginal artery RIGHT Lower Extremity Inflow: Thrombosed Outflow: The profundus femoral artery is thrombosed proximally and is reconstituted via the right reconstituted obturator artery. The stented superficial femoral artery remains thrombosed. Runoff: The runoff vasculature of the right lower extremity appears thrombosed without significant reconstitution via the muscular collaterals arising from the profundus. LEFT Lower Extremity Inflow: Thrombosed Outflow: There is weak reconstitution of profundus collaterals via the reconstituted left obturator and superior gluteal artery. Common femoral artery and superficial femoral artery are thrombosed. Runoff: The runoff vasculature of the left lower extremity is thrombosed. No significant  reconstitution is identified. Veins: No obvious venous abnormality within the limitations of this arterial phase study. Review of the MIP images confirms the above findings. NON-VASCULAR Lower chest: Largely excluded Hepatobiliary: No focal liver abnormality is seen within the visualized liver. Status post cholecystectomy. Mild intra and extrahepatic biliary ductal dilation likely represents post cholecystectomy change. Pancreas: Unremarkable Spleen: The visualized portion is unremarkable Adrenals/Urinary Tract: Adrenal glands are unremarkable. Simple cortical cysts are seen within the kidneys bilaterally for which no follow-up imaging is recommended. The kidneys are otherwise unremarkable. Foley catheter balloon seen within a decompressed bladder lumen. Stomach/Bowel: Stomach is within normal limits. Appendix appears normal. No evidence of bowel wall thickening, distention, or inflammatory changes. Mild ascites no free intraperitoneal gas. Lymphatic: No pathologic adenopathy within the abdomen and pelvis. Reproductive: Prostate gland is normal in size. There is a 14 mm hypoattenuating nodule within the left lobe of the prostate gland which is not well characterized on this examination. This may represent a malignant nodule or a area focal prostatitis. Other: Mild diffuse subcutaneous body edema. Musculoskeletal: Osseous structures are age-appropriate. No acute bone abnormality. IMPRESSION: 1. Thrombosis of the infrarenal abdominal aorta just above the level of the aortobifemoral bypass graft. The bypass graft itself is thrombosed.  The native aorta and bilateral lower extremity arterial inflow is thrombosed. 2. Weak reconstitution of the profundus femoral vasculature bilaterally, right greater than left, via reconstituted internal iliac collaterals. Common femoral and superficial femoral arteries are thrombosed. 3. Thrombosis of the runoff vasculature of the lower extremities bilaterally without significant  reconstitution. 4. 50% stenosis of the right renal artery at its origin. 5. Anasarca with mild ascites and subcutaneous body wall edema. 6. 14 mm hypoattenuating nodule within the left lobe of the prostate gland. Correlation with serum PSA level may be helpful for further management. Aortic Atherosclerosis (ICD10-I70.0). Electronically Signed   By: Dorethia Molt M.D.   On: 02/07/2024 20:07   DG Foot Complete Right Result Date: 02/06/2024 CLINICAL DATA:  Foot wounds, sepsis. EXAM: RIGHT FOOT COMPLETE - 3+ VIEW COMPARISON:  None Available. FINDINGS: There is no evidence of fracture or dislocation. No erosion or bony destructive change. Hammertoe deformity of the digits. Osteoarthritis of the first metatarsal phalangeal joint. There is thinning of the soft tissues posterior to the calcaneus. No soft tissue gas or radiopaque foreign body. IMPRESSION: 1. Thinning of the soft tissues posterior to the calcaneus. No radiographic findings of osteomyelitis. 2. Osteoarthritis of the first metatarsophalangeal joint. Electronically Signed   By: Andrea Gasman M.D.   On: 02/06/2024 15:51   DG Foot Complete Left Result Date: 02/06/2024 CLINICAL DATA:  Sepsis, foot wounds. EXAM: LEFT FOOT - COMPLETE 3+ VIEW COMPARISON:  None Available. FINDINGS: There is no evidence of fracture or dislocation. No erosive or bony destructive change. The site of wound is not well demonstrated by radiograph. No radiopaque foreign body or evidence of soft tissue gas. IMPRESSION: No radiographic findings of osteomyelitis. The site of wound is not well demonstrated by radiograph. Electronically Signed   By: Andrea Gasman M.D.   On: 02/06/2024 15:50   DG Chest Port 1 View Result Date: 02/06/2024 CLINICAL DATA:  Fever. EXAM: PORTABLE CHEST 1 VIEW COMPARISON:  10/25/2023 FINDINGS: The cardiomediastinal contours are normal. Mild hazy right perihilar opacity. Pulmonary vasculature is normal. No pleural effusion or pneumothorax. No acute osseous  abnormalities are seen. IMPRESSION: Mild hazy right perihilar opacity may represent pneumonia in the appropriate clinical setting. Electronically Signed   By: Andrea Gasman M.D.   On: 02/06/2024 15:49   CT ABDOMEN PELVIS WO CONTRAST Result Date: 02/06/2024 CLINICAL DATA:  Sepsis, mental status change. EXAM: CT ABDOMEN AND PELVIS WITHOUT CONTRAST TECHNIQUE: Multidetector CT imaging of the abdomen and pelvis was performed following the standard protocol without IV contrast. RADIATION DOSE REDUCTION: This exam was performed according to the departmental dose-optimization program which includes automated exposure control, adjustment of the mA and/or kV according to patient size and/or use of iterative reconstruction technique. COMPARISON:  01/18/2019. FINDINGS: Lower chest: Dependent atelectasis in the right lower lobe. Endobronchial debris and peribronchovascular nodularity in the left lower lobe. Heart is at the upper limits of normal in size. Decreased attenuation of the intravascular compartment is indicative of anemia. No pericardial or pleural effusion. Distal esophagus is grossly unremarkable. Hepatobiliary: Small hepatic cysts. No specific follow-up necessary. Liver is otherwise grossly unremarkable. Cholecystectomy. No unexpected biliary ductal dilatation. Pancreas: Negative. Spleen: Negative. Adrenals/Urinary Tract: Adrenal glands are unremarkable. Low-attenuation and subcentimeter hyperdense lesions in the kidneys. No specific follow-up necessary. Scarring in the lower pole left kidney. Tiny right renal stones. Ureters are decompressed. Granular stone debris in the right posterolateral bladder. There may be minimal bladder wall thickening. Foley catheter balloon is inflated in the posterior urethra. Stomach/Bowel:  Stomach, small bowel and colon are unremarkable. Appendix is not readily visualized. Vascular/Lymphatic: Aortobifemoral arterial bypass graft. Atherosclerotic calcification of the aorta. No  pathologically enlarged lymph nodes. Reproductive: Prostate is mildly prominent. Other: No free fluid. Musculoskeletal: Osteopenia.  Degenerative changes in the spine. IMPRESSION: 1. Endobronchial debris and peribronchovascular nodularity in the left lower lobe, indicative of aspiration. 2. Tiny right renal stones. Granular stone debris layers in the right posterolateral bladder. Possible minimal bladder wall thickening. 3. Foley catheter balloon is inflated in the posterior urethra. 4.  Aortic atherosclerosis (ICD10-I70.0). Electronically Signed   By: Newell Eke M.D.   On: 02/06/2024 15:18   CT Head Wo Contrast Result Date: 02/06/2024 CLINICAL DATA:  Altered mental status. EXAM: CT HEAD WITHOUT CONTRAST TECHNIQUE: Contiguous axial images were obtained from the base of the skull through the vertex without intravenous contrast. RADIATION DOSE REDUCTION: This exam was performed according to the departmental dose-optimization program which includes automated exposure control, adjustment of the mA and/or kV according to patient size and/or use of iterative reconstruction technique. COMPARISON:  10/05/2023 FINDINGS: Brain: No evidence of acute infarction, hemorrhage, hydrocephalus, extra-axial collection or mass lesion/mass effect. Minimal chronic ischemic microvascular disease. Vascular: No hyperdense vessel or unexpected calcification. Skull: Normal. Negative for fracture or focal lesion. Sinuses/Orbits: No acute finding. Other: None. IMPRESSION: 1. No acute findings. 2. Minimal chronic ischemic microvascular disease. Electronically Signed   By: Toribio Agreste M.D.   On: 02/06/2024 15:17   MR THORACIC SPINE WO CONTRAST Result Date: 02/02/2024 CLINICAL DATA:  Spinal stenosis, weakness EXAM: MRI THORACIC SPINE WITHOUT CONTRAST TECHNIQUE: Multiplanar, multisequence MR imaging of the thoracic spine was performed. No intravenous contrast was administered. COMPARISON:  None Available. FINDINGS: Alignment: Normal  Bone marrow signal: No significant abnormality Thoracic spinal cord: There is epidural lipomatosis with narrowing of the thecal sac which is most significant at that T6-T9 levels there is a small cyst within the epidural fat at the T5 level. Facet joints: No significant abnormality Intervertebral discs: There is no disc herniation. There is degenerative disc disease with partial ankylosis of the vertebral bodies at the T5-6, T6-7, T7-8 and T8-9 levels. Paraspinal tissues: No significant abnormality IMPRESSION: There is narrowing of the thecal sac due to epidural lipomatosis centered at the T6-T9 levels. No significant abnormality within the thoracic spinal cord. Electronically Signed   By: Nancyann Burns M.D.   On: 02/02/2024 08:35   MR CERVICAL SPINE WO CONTRAST Result Date: 02/02/2024 CLINICAL DATA:  Cervical spinal stenosis EXAM: MRI CERVICAL SPINE WITHOUT CONTRAST TECHNIQUE: Multiplanar, multisequence MR imaging of the cervical spine was performed. No intravenous contrast was administered. COMPARISON:  None Available. FINDINGS: The craniocervical junction is normal. No significant bone marrow signal abnormality The cervical spinal cord is normal. C2-C3: The disc height is preserved. The facet joints are normal. No spinal stenosis or foraminal stenosis C3-C4: There is ankylosis of the vertebral bodies. No significant facet disease. The spinal canal is small but there is no significant spinal stenosis. No foraminal stenosis C4-C5: There is a mild disc bulge. No significant facet disease. There is no significant spinal stenosis or foraminal stenosis C5-C6: Previous anterior fusion. No significant spinal stenosis or foraminal stenosis C6-C7: Previous anterior fusion. There is moderate spinal stenosis caused by bone growth at the level just below the vertebral endplate with indention of the cord. C7-T1: The disc is normal. There is a vertebral endplate spur with mild spinal stenosis. This is causing mild spinal  stenosis IMPRESSION: Congenitally small spinal canal. Previous  anterior fusion at C5-C7. There is spinal stenosis at the level of C7 due to bone growth associated with effusion with indention of the cord. Electronically Signed   By: Nancyann Burns M.D.   On: 02/02/2024 08:12    Microbiology: Results for orders placed or performed during the hospital encounter of 02/06/24  Resp panel by RT-PCR (RSV, Flu A&B, Covid) Anterior Nasal Swab     Status: None   Collection Time: 02/06/24  2:09 PM   Specimen: Anterior Nasal Swab  Result Value Ref Range Status   SARS Coronavirus 2 by RT PCR NEGATIVE NEGATIVE Final   Influenza A by PCR NEGATIVE NEGATIVE Final   Influenza B by PCR NEGATIVE NEGATIVE Final    Comment: (NOTE) The Xpert Xpress SARS-CoV-2/FLU/RSV plus assay is intended as an aid in the diagnosis of influenza from Nasopharyngeal swab specimens and should not be used as a sole basis for treatment. Nasal washings and aspirates are unacceptable for Xpert Xpress SARS-CoV-2/FLU/RSV testing.  Fact Sheet for Patients: BloggerCourse.com  Fact Sheet for Healthcare Providers: SeriousBroker.it  This test is not yet approved or cleared by the [place]  FDA and has been authorized for detection and/or diagnosis of SARS-CoV-2 by FDA under an Emergency Use Authorization (EUA). This EUA will remain in effect (meaning this test can be used) for the duration of the COVID-19 declaration under Section 564(b)(1) of the Act, 21 U.S.C. section 360bbb-3(b)(1), unless the authorization is terminated or revoked.     Resp Syncytial Virus by PCR NEGATIVE NEGATIVE Final    Comment: (NOTE) Fact Sheet for Patients: BloggerCourse.com  Fact Sheet for Healthcare Providers: SeriousBroker.it  This test is not yet approved or cleared by the [place]  FDA and has been authorized for detection and/or diagnosis of  SARS-CoV-2 by FDA under an Emergency Use Authorization (EUA). This EUA will remain in effect (meaning this test can be used) for the duration of the COVID-19 declaration under Section 564(b)(1) of the Act, 21 U.S.C. section 360bbb-3(b)(1), unless the authorization is terminated or revoked.  Performed at [facility], [address] [place], [place] [address]   Blood Culture (routine x 2)     Status: None   Collection Time: 02/06/24  2:14 PM   Specimen: BLOOD RIGHT FOREARM  Result Value Ref Range Status   Specimen Description BLOOD RIGHT FOREARM  Final   Special Requests   Final    BOTTLES DRAWN AEROBIC AND ANAEROBIC Blood Culture results may not be optimal due to an inadequate volume of blood received in culture bottles   Culture   Final    NO GROWTH 5 DAYS Performed at [facility], [address] [place], [place] [address]    Report Status 02/11/2024 FINAL  Final  Blood Culture (routine x 2)     Status: Abnormal   Collection Time: 02/06/24  2:49 PM   Specimen: BLOOD  Result Value Ref Range Status   Specimen Description BLOOD RIGHT ANTECUBITAL  Final   Special Requests   Final    BOTTLES DRAWN AEROBIC AND ANAEROBIC Blood Culture adequate volume   Culture  Setup Time   Final    GRAM NEGATIVE RODS IN BOTH AEROBIC AND ANAEROBIC BOTTLES CRITICAL RESULT CALLED TO, READ BACK BY AND VERIFIED WITH: PHARMD G ABBOTT 02/07/2024 @ 0703 BY AB Performed at [facility], [address], [place], [place] [address]    Culture PROTEUS MIRABILIS (A)  Final   Report Status 02/09/2024 FINAL  Final  Organism ID, Bacteria PROTEUS MIRABILIS  Final      Susceptibility   Proteus mirabilis - MIC*    AMPICILLIN <=2 SENSITIVE Sensitive     CEFAZOLIN  Value in next row Intermediate      4 INTERMEDIATEThis is a modified FDA-approved test that has been validated and its performance characteristics determined by the reporting laboratory.  This laboratory is certified under the Clinical  Laboratory Improvement Amendments CLIA as qualified to perform high complexity clinical laboratory testing.    CEFEPIME  Value in next row Sensitive      4 INTERMEDIATEThis is a modified FDA-approved test that has been validated and its performance characteristics determined by the reporting laboratory.  This laboratory is certified under the Clinical Laboratory Improvement Amendments CLIA as qualified to perform high complexity clinical laboratory testing.    ERTAPENEM Value in next row Sensitive      4 INTERMEDIATEThis is a modified FDA-approved test that has been validated and its performance characteristics determined by the reporting laboratory.  This laboratory is certified under the Clinical Laboratory Improvement Amendments CLIA as qualified to perform high complexity clinical laboratory testing.    CEFTRIAXONE  Value in next row Sensitive      4 INTERMEDIATEThis is a modified FDA-approved test that has been validated and its performance characteristics determined by the reporting laboratory.  This laboratory is certified under the Clinical Laboratory Improvement Amendments CLIA as qualified to perform high complexity clinical laboratory testing.    CIPROFLOXACIN Value in next row Sensitive      4 INTERMEDIATEThis is a modified FDA-approved test that has been validated and its performance characteristics determined by the reporting laboratory.  This laboratory is certified under the Clinical Laboratory Improvement Amendments CLIA as qualified to perform high complexity clinical laboratory testing.    GENTAMICIN Value in next row Sensitive      4 INTERMEDIATEThis is a modified FDA-approved test that has been validated and its performance characteristics determined by the reporting laboratory.  This laboratory is certified under the Clinical Laboratory Improvement Amendments CLIA as qualified to perform high complexity clinical laboratory testing.    MEROPENEM Value in next row Sensitive      4  INTERMEDIATEThis is a modified FDA-approved test that has been validated and its performance characteristics determined by the reporting laboratory.  This laboratory is certified under the Clinical Laboratory Improvement Amendments CLIA as qualified to perform high complexity clinical laboratory testing.    TRIMETH/SULFA Value in next row Sensitive      4 INTERMEDIATEThis is a modified FDA-approved test that has been validated and its performance characteristics determined by the reporting laboratory.  This laboratory is certified under the Clinical Laboratory Improvement Amendments CLIA as qualified to perform high complexity clinical laboratory testing.    AMPICILLIN/SULBACTAM Value in next row Sensitive      4 INTERMEDIATEThis is a modified FDA-approved test that has been validated and its performance characteristics determined by the reporting laboratory.  This laboratory is certified under the Clinical Laboratory Improvement Amendments CLIA as qualified to perform high complexity clinical laboratory testing.    PIP/TAZO Value in next row Sensitive ug/mL     <=4 SENSITIVEThis is a modified FDA-approved test that has been validated and its performance characteristics determined by the reporting laboratory.  This laboratory is certified under the Clinical Laboratory Improvement Amendments CLIA as qualified to perform high complexity clinical laboratory testing.    * PROTEUS MIRABILIS  Blood Culture ID Panel (Reflexed)     Status: Abnormal   Collection  Time: 02/06/24  2:49 PM  Result Value Ref Range Status   Enterococcus faecalis NOT DETECTED NOT DETECTED Final   Enterococcus Faecium NOT DETECTED NOT DETECTED Final   Listeria monocytogenes NOT DETECTED NOT DETECTED Final   Staphylococcus species NOT DETECTED NOT DETECTED Final   Staphylococcus aureus (BCID) NOT DETECTED NOT DETECTED Final   Staphylococcus epidermidis NOT DETECTED NOT DETECTED Final   Staphylococcus lugdunensis NOT DETECTED NOT  DETECTED Final   Streptococcus species NOT DETECTED NOT DETECTED Final   Streptococcus agalactiae NOT DETECTED NOT DETECTED Final   Streptococcus pneumoniae NOT DETECTED NOT DETECTED Final   Streptococcus pyogenes NOT DETECTED NOT DETECTED Final   A.calcoaceticus-baumannii NOT DETECTED NOT DETECTED Final   Bacteroides fragilis NOT DETECTED NOT DETECTED Final   Enterobacterales DETECTED (A) NOT DETECTED Final    Comment: Enterobacterales represent a large order of gram negative bacteria, not a single organism. CRITICAL RESULT CALLED TO, READ BACK BY AND VERIFIED WITH: PHARMD G ABBOTT 02/07/2024 @ 0703 BY AB    Enterobacter cloacae complex NOT DETECTED NOT DETECTED Final   Escherichia coli NOT DETECTED NOT DETECTED Final   Klebsiella aerogenes NOT DETECTED NOT DETECTED Final   Klebsiella oxytoca NOT DETECTED NOT DETECTED Final   Klebsiella pneumoniae NOT DETECTED NOT DETECTED Final   Proteus species DETECTED (A) NOT DETECTED Final    Comment: CRITICAL RESULT CALLED TO, READ BACK BY AND VERIFIED WITH: PHARMD G ABBOTT 02/07/2024 @ 0703 BY AB    Salmonella species NOT DETECTED NOT DETECTED Final   Serratia marcescens NOT DETECTED NOT DETECTED Final   Haemophilus influenzae NOT DETECTED NOT DETECTED Final   Neisseria meningitidis NOT DETECTED NOT DETECTED Final   Pseudomonas aeruginosa NOT DETECTED NOT DETECTED Final   Stenotrophomonas maltophilia NOT DETECTED NOT DETECTED Final   Candida albicans NOT DETECTED NOT DETECTED Final   Candida auris NOT DETECTED NOT DETECTED Final   Candida glabrata NOT DETECTED NOT DETECTED Final   Candida krusei NOT DETECTED NOT DETECTED Final   Candida parapsilosis NOT DETECTED NOT DETECTED Final   Candida tropicalis NOT DETECTED NOT DETECTED Final   Cryptococcus neoformans/gattii NOT DETECTED NOT DETECTED Final   CTX-M ESBL NOT DETECTED NOT DETECTED Final   Carbapenem resistance IMP NOT DETECTED NOT DETECTED Final   Carbapenem resistance KPC NOT DETECTED  NOT DETECTED Final   Carbapenem resistance NDM NOT DETECTED NOT DETECTED Final   Carbapenem resist OXA 48 LIKE NOT DETECTED NOT DETECTED Final   Carbapenem resistance VIM NOT DETECTED NOT DETECTED Final    Comment: Performed at [facility], [address], [place], [place] [address]  Urine Culture     Status: Abnormal   Collection Time: 02/07/24  1:40 AM   Specimen: Urine, Random  Result Value Ref Range Status   Specimen Description URINE, RANDOM  Final   Special Requests NONE Reflexed from [id number]  Final   Culture (A)  Final    <10,000 COLONIES/mL INSIGNIFICANT GROWTH Performed at [facility], [address], [place], [place] [address]    Report Status 02/08/2024 FINAL  Final    Labs: CBC: Recent Labs  Lab 02/06/24 1449 02/07/24 0451 02/09/24 0308 02/10/24 0336  WBC 22.2* 22.3* 14.0* 10.9*  NEUTROABS 22.0*  --  12.9* 9.3*  HGB 8.1* 8.6* 8.5* 9.1*  HCT 24.6* 26.1* 26.2* 29.0*  MCV 88.5 85.9 87.6 88.1  PLT 204 174 183 198   Basic Metabolic Panel: Recent Labs  Lab 02/06/24 1449 02/07/24 0451 02/09/24 0308 02/10/24 9663  NA 134* 136 136 137  K 4.6 4.3 3.7 3.8  CL 98 101 99 102  CO2 24 26 26 26   GLUCOSE 118* 116* 141* 95  BUN 52* 39* 32* 24*  CREATININE 1.49* 1.14 1.07 1.00  CALCIUM  8.0* 7.8* 7.7* 8.0*  MG  --   --  1.9  --    Liver Function Tests: Recent Labs  Lab 02/06/24 1449 02/07/24 0451 02/10/24 0336  AST 58* 116* 76*  ALT 24 34 32  ALKPHOS 107 68 68  BILITOT 0.6 0.8 0.7  PROT 5.4* 5.1* 5.1*  ALBUMIN  2.0* 1.8* 1.9*   CBG: Recent Labs  Lab 02/10/24 0621 02/10/24 0822 02/10/24 1156 02/10/24 1541 02/10/24 2122  GLUCAP 92 73 104* 198* 152*    Discharge time spent: greater than 30 minutes.  Signed: Daisi Kentner, DO [facility] 02/11/2024

## 2024-02-11 NOTE — TOC Transition Note (Signed)
 Transition of Care (TOC) - Discharge Note Rayfield Gobble RN, BSN Inpatient Care Management Unit 4E- RN Case Manager See Treatment Team for direct phone #   Patient Details  Name: Christian Bailey MRN: [id number] Date of Birth: 08/28/1940  Transition of Care [facility]) CM/SW Contact:  Gobble Rayfield Hurst, RN Phone Number: 02/11/2024, 4:32 PM   Clinical Narrative:    Received msg from [facility] liaison that son has called to inform them that they would like to accept bed offer for [facility]. Bed available today and [facility] will be ready to admit once family completes consents.   CM has informed HOP liaison of family choice for [facility].   [facility] called for transport- pt placed on will call list- unit staff will need to call [facility] and let them know when pt is ready for transport- [facility] to let staff know once consents have been completed.   Paperwork and signed GOLD DNR placed on chart for transport.   [facility] number- [phone].    Final next level of care: Hospice Medical Facility Barriers to Discharge: Barriers Resolved   Patient Goals and CMS Choice Patient states their goals for this hospitalization and ongoing recovery are:: Comfort Care   Choice offered to / list presented to : Spouse      Discharge Placement                 IP Hospice facility.       Discharge Plan and Services Additional resources added to the After Visit Summary for   In-house Referral: Clinical Social Work Discharge Planning Services: CM Consult Post Acute Care Choice: Hospice          DME Arranged: N/A DME Agency: NA         HH Agency: [facility] Date [facility] Agency Contacted: 02/09/24   Representative spoke with at [facility] Agency: Eleanor  Social Drivers of Health (SDOH) Interventions SDOH Screenings   Food Insecurity: Patient Unable To Answer (02/07/2024)  Housing: Unknown (02/07/2024)  Transportation Needs: Patient Unable To Answer (02/07/2024)   Utilities: Patient Unable To Answer (02/07/2024)  Social Connections: Unknown (02/07/2024)  Tobacco Use: Medium Risk (02/06/2024)     Readmission Risk Interventions    02/11/2024    4:32 PM  Readmission Risk Prevention Plan  Transportation Screening Complete  HRI or Home Care Consult Complete  Social Work Consult for Recovery Care Planning/Counseling Complete  Palliative Care Screening Complete  Medication Review Oceanographer) Complete

## 2024-02-11 NOTE — Progress Notes (Addendum)
 [facility] [phone] [facility] Liaison Note   Family agreeable to transfer today. TOC aware. RN please call report to [phone] prior to patient leaving the unit. Please send signed DNR with patient at discharge.    Thank you for allowing us  to participate in this patient's care.   Eleanor Nail, LPN [facility] Liaison [phone]  550pm update. Notified by [facility] social work that family has decided to cancel discharge to [facility]. They desire for patient to remain in the hospital while they continue to assess their discharge options. Inpatient team notified.

## 2024-02-11 NOTE — Progress Notes (Signed)
 Daily Progress Note   Patient Name: Christian Bailey       Date: 02/11/2024 DOB: Nov 19, 1940  Age: 83 y.o. MRN#: [id number] Attending Physician: Soledad Ligas, DO Primary Care Physician: Janey Santos, MD Admit Date: 02/06/2024  Reason for Consultation/Follow-up: Establishing goals of care  Length of Stay: 5  Current Medications: Scheduled Meds:   acetaminophen   1,000 mg Oral TID   antiseptic oral rinse  15 mL Topical TID   bisacodyl   10 mg Rectal Daily   dantrolene   100 mg Oral BID   diclofenac  Sodium  2 g Topical QID   docusate sodium   200 mg Oral Daily   lidocaine   1 patch Transdermal Q24H   pantoprazole   40 mg Oral BID   pregabalin   50 mg Oral BID   senna-docusate  1 tablet Oral BID   tamsulosin   0.4 mg Oral QPC supper    Continuous Infusions:   PRN Meds: artificial tears, fluticasone , HYDROmorphone  (DILAUDID ) injection, ondansetron  **OR** ondansetron  (ZOFRAN ) IV, simethicone   Physical Exam Vitals reviewed.  Constitutional:      General: He is not in acute distress. HENT:     Head: Normocephalic and atraumatic.  Cardiovascular:     Rate and Rhythm: Normal rate.  Pulmonary:     Effort: Pulmonary effort is normal.  Skin:    General: Skin is warm and dry.  Neurological:     Mental Status: He is alert and oriented to person, place, and time.  Psychiatric:        Mood and Affect: Mood normal.        Behavior: Behavior normal.        Thought Content: Thought content normal.        Judgment: Judgment normal.             Vital Signs: BP (!) 107/51 (BP Location: Left Arm)   Pulse 90   Temp 98.6 F (37 C) (Axillary)   Resp 15   Ht 5' 4 (1.626 m)   Wt 57.5 kg   SpO2 100%   BMI 21.76 kg/m  SpO2: SpO2: 100 % O2 Device: O2 Device: Room Air O2 Flow Rate:         Palliative Assessment/Data: 30%      Patient Active Problem List   Diagnosis Date Noted   Sepsis due to pneumonia (  HCC) 02/06/2024   Pressure injury of deep tissue of foot 11/23/2023   Pressure injury of deep tissue of buttock 11/23/2023   Coping style affecting medical condition 11/11/2023   Protein-calorie malnutrition, severe 11/01/2023   Adjustment disorder with mixed anxiety and depressed mood 10/28/2023   Depression with anxiety 10/25/2023   Urinary retention 10/22/2023   Neurogenic bladder 10/22/2023   Transient iatrogenic urethral bleeding 10/22/2023   Acute incomplete quadriplegia (HCC) 10/19/2023   Spinal cord compression (HCC) 10/18/2023   Unsteady gait 10/18/2023   Neck pain 10/18/2023   Cervical myelopathy (HCC) 10/18/2023   Delirium due to another medical condition, acute, hyperactive 10/09/2023   Intractable back pain 10/03/2023   Fall 10/02/2023   Acute renal failure superimposed on stage 3b chronic kidney disease, unspecified acute renal failure type (HCC) 05/07/2023   Leukocytosis 05/07/2023   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) 03/25/2020   Exudative age-related macular degeneration of right eye with inactive choroidal neovascularization (HCC) 03/25/2020   Advanced nonexudative age-related macular degeneration of right eye with subfoveal involvement 03/25/2020   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 involvement 03/25/2020   Bacteremia due to Gram-negative bacteria 01/19/2019   CKD (chronic kidney disease) stage 3, GFR 30-59 ml/min (HCC) 01/19/2019   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) 07/06/2018   S/P shoulder replacement, right 12/07/2016   PVD (peripheral vascular disease) (HCC) 09/18/2016   Sinus tachycardia 05/15/2014   Carotid artery disease (HCC) 04/26/2014   PAD (peripheral artery disease) (HCC) 06/12/2013    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 male with PMH significant for HTN, HLD, poorly controlled Type II DM, CKD III and PAD who was admitted  from [facility] with AMS and SOB.  Identified to be septics from pneumonia. Palliative care has been asked to support additional goals of care conversations in the setting of healthcare decisions   Today's Discussion: Reviewed chart. Patient required two prn dilaudid  doses over the last 24 hours. Patient sitting up in bed in NAD. He is alert and oriented. Son and wife are at bedside. The patient was drowsy yesterday afternoon and most of last night. States his pain was managed overnight but is beginning to worsen. He has already called RN for prn dose.   We discussed the plan to review his prn pain medication needs today to determine if a continuous infusion is appropriate. Discussed that it may be difficult to manage pain without having drowsiness/sleeping.  Patient and family understand this. Family are grateful the patient is alert and oriented today and able to participate in goals of care discussions. Patient is in agreement with comfort measures and transfer to inpatient hospice. Educated family on comfort measures including the discontinuing medications that do not provide comfort, discontinuing frequent vitals/labs/diagnostic testing, and providing as needed medications for symptom management.  Family has met with hospice liaison's from both [facility] and [facility].  They are considering their options.  Emotional support and therapeutic listening provided.  Encouraged patient and  family to call PMT with questions or needs.  PMT will continue to follow.  Recommendations/Plan: Comfort measures Comfort medications per [facility] Added PRN ativan  Awaiting transition to hospice facility- family considering options Continued PMT support    Code Status:    Code Status Orders  (From admission, onward)           Start     Ordered   02/10/24 1516  Do not attempt resuscitation (DNR) - Comfort care  Continuous       Question Answer Comment  If patient has no pulse and is not breathing Do Not Attempt Resuscitation   In Pre-Arrest Conditions (Patient Is Breathing and Has a Pulse) Provide comfort measures. Relieve any mechanical airway obstruction. Avoid transfer unless required for comfort.   Consent: Discussion documented in EHR or advanced directives reviewed      02/10/24 1515         Extensive chart review has been completed prior to seeing the patient including vital signs, progress/consult notes, orders, medications, and available advance directive documents.  Care plan was discussed with TOC  Time spent: 50 minutes  Thank you for allowing the Palliative Medicine Team to assist in the care of this patient.    Stephane CHRISTELLA Palin, NP  Please contact Palliative Medicine Team phone at [phone] for questions and concerns.

## 2024-02-11 NOTE — Progress Notes (Signed)
 PROGRESS NOTE    Christian Bailey  FMW:[id number] DOB: 05-Apr-1941 DOA: 02/06/2024 PCP: Janey Santos, MD    Brief Narrative:  83 year old male with DM2, HTN, anemia of chronic disease, PAD, comes in from [facility] with weakness and shortness of breath, was also found to be altered. He was febrile on admission to 102.1, and on further evaluation was found to have sepsis due to pneumonia and possible sacral and heel decubitus ulcer infection.  Patient has chronic indwelling Foley catheter, decubitus ulcers.  Chest x-ray showed left perihilar infiltrate. CT scan shows endobronchial debris. Recent extensive hospitalizations, severe peripheral vascular disease and declining health. Palliative care was consulted. The patient has decided against amputation for now:SABRA The patient and his wife have met with palliative care and have decided for comfort care.  The patient's family had decided on a hospice facility, but changed there minds at the last minute. Discharge was cancelled. Awaiting family decision on placement.  Subjective: The patient is resting comfortably. No new complaints. He states that pain control is adequate.  Assessment & Plan:   Sepsis present on admission secondary to pneumonia, suspected aspiration pneumonia.  Proteus bacteremia. Resolved.   Presented with leukocytosis, fever and shortness of breath.  Blood culture positive for Proteus bacteremia.  Currently remains on high-dose Rocephin .  Day 3 Rocephin .  Changing to Augmentin  to complete total 7 days of therapy. Suspected aspiration pneumonia.  Speech therapy evaluated the patient.  Now on dysphagia diet. Use incentive spirometer as much as possible but difficult for patient to participate.  Neurogenic bladder: Foley catheter exchanged.  Urine cultures less than 10,000 colonies.  Bacteremia likely secondary to pneumonia.  Peripheral vascular disease: Chronic, occluded and thrombotic bypass graft.  Vascular  following.Patient has ischemia of both legs, not salvageable by vascular intervention.  Recommended bilateral above-knee amputation versus palliation.  Hyperlipidemia: On a statin.  CKD stage IIIa: At about baseline.  Type 2 diabetes: On insulin .   Pressure ulcer present on admission: Local wound care.  Reportedly not infected.  Goal of care:  Remains very debilitated with difficult to treat conditions.  Ischemic bilateral lower leg and no options for revascularization. Multiple medical issues, immobility. Updated patient's family at the bedside.  They are meeting with vascular surgery.  Patient is not a candidate to undergo for revascularization or even bilateral above-knee amputation.  He will not recover from this surgery. Pt has decided upon discharge to hospice facility. Awaiting family's final decision on facility. The patient is comfort care only.    DVT prophylaxis:  None   Code Status: DNR with intervention Family Communication: Wife and 2 sons at the bedside. Disposition Plan: Status is: Inpatient Remains inpatient appropriate because: IV antibiotics, critical limb ischemia.   Consultants:  Vascular surgery Palliative care  Procedures:  None  Antimicrobials:  Rocephin  8/10--- 8/13 Augmentin  8/13--     Objective: Vitals:   02/10/24 1542 02/10/24 1600 02/10/24 1700 02/10/24 1951  BP: (!) 128/57   (!) 107/51  Pulse: (!) 110   90  Resp: 10 11 (!) 9 15  Temp: 98.3 F (36.8 C)   98.6 F (37 C)  TempSrc: Oral   Axillary  SpO2: (!) 87% (!) 86% 91% 100%  Weight:      Height:        Intake/Output Summary (Last 24 hours) at 02/11/2024 1932 Last data filed at 02/11/2024 1337 Gross per 24 hour  Intake 0 ml  Output 850 ml  Net -850 ml   [employer]  02/06/24 1404 02/07/24 0641  Weight: 53.1 kg 57.5 kg    Examination:  Exam:  Constitutional:  The patient is awake, alert, and oriented x 3. No acute distress. Respiratory:  No increased work of  breathing. No wheezes, rales, or rhonchi No tactile fremitus Cardiovascular:  Regular rate and rhythm No murmurs, ectopy, or gallups. No lateral PMI. No thrills. Abdomen:  Abdomen is soft, non-tender, non-distended No hernias, masses, or organomegaly Normoactive bowel sounds.  Musculoskeletal:  Lower extremities are cold bilaterally. Skin:  No rashes, lesions, ulcers palpation of skin: no induration or nodules Neurologic:  CN 2-12 intact Sensation all 4 extremities intact Psychiatric:  Mental status Mood, affect appropriate Orientation to person, place, time  judgment and insight appear intact  Data Reviewed: I have personally reviewed following labs and imaging studies  CBC: Recent Labs  Lab 02/06/24 1449 02/07/24 0451 02/09/24 0308 02/10/24 0336  WBC 22.2* 22.3* 14.0* 10.9*  NEUTROABS 22.0*  --  12.9* 9.3*  HGB 8.1* 8.6* 8.5* 9.1*  HCT 24.6* 26.1* 26.2* 29.0*  MCV 88.5 85.9 87.6 88.1  PLT 204 174 183 198   Basic Metabolic Panel: Recent Labs  Lab 02/06/24 1449 02/07/24 0451 02/09/24 0308 02/10/24 0336  NA 134* 136 136 137  K 4.6 4.3 3.7 3.8  CL 98 101 99 102  CO2 24 26 26 26   GLUCOSE 118* 116* 141* 95  BUN 52* 39* 32* 24*  CREATININE 1.49* 1.14 1.07 1.00  CALCIUM  8.0* 7.8* 7.7* 8.0*  MG  --   --  1.9  --    GFR: Estimated Creatinine Clearance: 46.3 mL/min (by C-G formula based on SCr of 1 mg/dL). Liver Function Tests: Recent Labs  Lab 02/06/24 1449 02/07/24 0451 02/10/24 0336  AST 58* 116* 76*  ALT 24 34 32  ALKPHOS 107 68 68  BILITOT 0.6 0.8 0.7  PROT 5.4* 5.1* 5.1*  ALBUMIN  2.0* 1.8* 1.9*   No results for input(s): LIPASE, AMYLASE in the last 168 hours. No results for input(s): AMMONIA in the last 168 hours. Coagulation Profile: Recent Labs  Lab 02/06/24 1449 02/07/24 0451  INR 1.2 1.2   Cardiac Enzymes: No results for input(s): CKTOTAL, CKMB, CKMBINDEX, TROPONINI in the last 168 hours. BNP (last 3 results) No  results for input(s): PROBNP in the last 8760 hours. HbA1C: No results for input(s): HGBA1C in the last 72 hours. CBG: Recent Labs  Lab 02/10/24 0621 02/10/24 0822 02/10/24 1156 02/10/24 1541 02/10/24 2122  GLUCAP 92 73 104* 198* 152*   Lipid Profile: No results for input(s): CHOL, HDL, LDLCALC, TRIG, CHOLHDL, LDLDIRECT in the last 72 hours. Thyroid  Function Tests: No results for input(s): TSH, T4TOTAL, FREET4, T3FREE, THYROIDAB in the last 72 hours. Anemia Panel: No results for input(s): VITAMINB12, FOLATE, FERRITIN, TIBC, IRON, RETICCTPCT in the last 72 hours. Sepsis Labs: Recent Labs  Lab 02/06/24 1510 02/06/24 1752  LATICACIDVEN 3.4* 1.5    Recent Results (from the past 240 hours)  Resp panel by RT-PCR (RSV, Flu A&B, Covid) Anterior Nasal Swab     Status: None   Collection Time: 02/06/24  2:09 PM   Specimen: Anterior Nasal Swab  Result Value Ref Range Status   SARS Coronavirus 2 by RT PCR NEGATIVE NEGATIVE Final   Influenza A by PCR NEGATIVE NEGATIVE Final   Influenza B by PCR NEGATIVE NEGATIVE Final    Comment: (NOTE) The Xpert Xpress SARS-CoV-2/FLU/RSV plus assay is intended as an aid in the diagnosis of influenza from Nasopharyngeal swab specimens and  should not be used as a sole basis for treatment. Nasal washings and aspirates are unacceptable for Xpert Xpress SARS-CoV-2/FLU/RSV testing.  Fact Sheet for Patients: BloggerCourse.com  Fact Sheet for Healthcare Providers: SeriousBroker.it  This test is not yet approved or cleared by the [place]  FDA and has been authorized for detection and/or diagnosis of SARS-CoV-2 by FDA under an Emergency Use Authorization (EUA). This EUA will remain in effect (meaning this test can be used) for the duration of the COVID-19 declaration under Section 564(b)(1) of the Act, 21 U.S.C. section 360bbb-3(b)(1), unless the authorization is  terminated or revoked.     Resp Syncytial Virus by PCR NEGATIVE NEGATIVE Final    Comment: (NOTE) Fact Sheet for Patients: BloggerCourse.com  Fact Sheet for Healthcare Providers: SeriousBroker.it  This test is not yet approved or cleared by the [place]  FDA and has been authorized for detection and/or diagnosis of SARS-CoV-2 by FDA under an Emergency Use Authorization (EUA). This EUA will remain in effect (meaning this test can be used) for the duration of the COVID-19 declaration under Section 564(b)(1) of the Act, 21 U.S.C. section 360bbb-3(b)(1), unless the authorization is terminated or revoked.  Performed at [facility], [address], [place], [place] [address]   Blood Culture (routine x 2)     Status: None   Collection Time: 02/06/24  2:14 PM   Specimen: BLOOD RIGHT FOREARM  Result Value Ref Range Status   Specimen Description BLOOD RIGHT FOREARM  Final   Special Requests   Final    BOTTLES DRAWN AEROBIC AND ANAEROBIC Blood Culture results may not be optimal due to an inadequate volume of blood received in culture bottles   Culture   Final    NO GROWTH 5 DAYS Performed at [facility], [address], [place], [place] [address]    Report Status 02/11/2024 FINAL  Final  Blood Culture (routine x 2)     Status: Abnormal   Collection Time: 02/06/24  2:49 PM   Specimen: BLOOD  Result Value Ref Range Status   Specimen Description BLOOD RIGHT ANTECUBITAL  Final   Special Requests   Final    BOTTLES DRAWN AEROBIC AND ANAEROBIC Blood Culture adequate volume   Culture  Setup Time   Final    GRAM NEGATIVE RODS IN BOTH AEROBIC AND ANAEROBIC BOTTLES CRITICAL RESULT CALLED TO, READ BACK BY AND VERIFIED WITH: PHARMD G ABBOTT 02/07/2024 @ 0703 BY AB Performed at [facility], [address] [place], [place] [address]    Culture PROTEUS MIRABILIS (A)  Final   Report Status 02/09/2024 FINAL  Final    Organism ID, Bacteria PROTEUS MIRABILIS  Final      Susceptibility   Proteus mirabilis - MIC*    AMPICILLIN <=2 SENSITIVE Sensitive     CEFAZOLIN  Value in next row Intermediate      4 INTERMEDIATEThis is a modified FDA-approved test that has been validated and its performance characteristics determined by the reporting laboratory.  This laboratory is certified under the Clinical Laboratory Improvement Amendments CLIA as qualified to perform high complexity clinical laboratory testing.    CEFEPIME  Value in next row Sensitive      4 INTERMEDIATEThis is a modified FDA-approved test that has been validated and its performance characteristics determined by the reporting laboratory.  This laboratory is certified under the Clinical Laboratory Improvement Amendments CLIA as qualified to perform high complexity clinical laboratory testing.    ERTAPENEM Value in next row Sensitive  4 INTERMEDIATEThis is a modified FDA-approved test that has been validated and its performance characteristics determined by the reporting laboratory.  This laboratory is certified under the Clinical Laboratory Improvement Amendments CLIA as qualified to perform high complexity clinical laboratory testing.    CEFTRIAXONE  Value in next row Sensitive      4 INTERMEDIATEThis is a modified FDA-approved test that has been validated and its performance characteristics determined by the reporting laboratory.  This laboratory is certified under the Clinical Laboratory Improvement Amendments CLIA as qualified to perform high complexity clinical laboratory testing.    CIPROFLOXACIN Value in next row Sensitive      4 INTERMEDIATEThis is a modified FDA-approved test that has been validated and its performance characteristics determined by the reporting laboratory.  This laboratory is certified under the Clinical Laboratory Improvement Amendments CLIA as qualified to perform high complexity clinical laboratory testing.    GENTAMICIN Value in  next row Sensitive      4 INTERMEDIATEThis is a modified FDA-approved test that has been validated and its performance characteristics determined by the reporting laboratory.  This laboratory is certified under the Clinical Laboratory Improvement Amendments CLIA as qualified to perform high complexity clinical laboratory testing.    MEROPENEM Value in next row Sensitive      4 INTERMEDIATEThis is a modified FDA-approved test that has been validated and its performance characteristics determined by the reporting laboratory.  This laboratory is certified under the Clinical Laboratory Improvement Amendments CLIA as qualified to perform high complexity clinical laboratory testing.    TRIMETH/SULFA Value in next row Sensitive      4 INTERMEDIATEThis is a modified FDA-approved test that has been validated and its performance characteristics determined by the reporting laboratory.  This laboratory is certified under the Clinical Laboratory Improvement Amendments CLIA as qualified to perform high complexity clinical laboratory testing.    AMPICILLIN/SULBACTAM Value in next row Sensitive      4 INTERMEDIATEThis is a modified FDA-approved test that has been validated and its performance characteristics determined by the reporting laboratory.  This laboratory is certified under the Clinical Laboratory Improvement Amendments CLIA as qualified to perform high complexity clinical laboratory testing.    PIP/TAZO Value in next row Sensitive ug/mL     <=4 SENSITIVEThis is a modified FDA-approved test that has been validated and its performance characteristics determined by the reporting laboratory.  This laboratory is certified under the Clinical Laboratory Improvement Amendments CLIA as qualified to perform high complexity clinical laboratory testing.    * PROTEUS MIRABILIS  Blood Culture ID Panel (Reflexed)     Status: Abnormal   Collection Time: 02/06/24  2:49 PM  Result Value Ref Range Status   Enterococcus  faecalis NOT DETECTED NOT DETECTED Final   Enterococcus Faecium NOT DETECTED NOT DETECTED Final   Listeria monocytogenes NOT DETECTED NOT DETECTED Final   Staphylococcus species NOT DETECTED NOT DETECTED Final   Staphylococcus aureus (BCID) NOT DETECTED NOT DETECTED Final   Staphylococcus epidermidis NOT DETECTED NOT DETECTED Final   Staphylococcus lugdunensis NOT DETECTED NOT DETECTED Final   Streptococcus species NOT DETECTED NOT DETECTED Final   Streptococcus agalactiae NOT DETECTED NOT DETECTED Final   Streptococcus pneumoniae NOT DETECTED NOT DETECTED Final   Streptococcus pyogenes NOT DETECTED NOT DETECTED Final   A.calcoaceticus-baumannii NOT DETECTED NOT DETECTED Final   Bacteroides fragilis NOT DETECTED NOT DETECTED Final   Enterobacterales DETECTED (A) NOT DETECTED Final    Comment: Enterobacterales represent a large order of gram negative bacteria, not a  single organism. CRITICAL RESULT CALLED TO, READ BACK BY AND VERIFIED WITH: PHARMD G ABBOTT 02/07/2024 @ 0703 BY AB    Enterobacter cloacae complex NOT DETECTED NOT DETECTED Final   Escherichia coli NOT DETECTED NOT DETECTED Final   Klebsiella aerogenes NOT DETECTED NOT DETECTED Final   Klebsiella oxytoca NOT DETECTED NOT DETECTED Final   Klebsiella pneumoniae NOT DETECTED NOT DETECTED Final   Proteus species DETECTED (A) NOT DETECTED Final    Comment: CRITICAL RESULT CALLED TO, READ BACK BY AND VERIFIED WITH: PHARMD G ABBOTT 02/07/2024 @ 0703 BY AB    Salmonella species NOT DETECTED NOT DETECTED Final   Serratia marcescens NOT DETECTED NOT DETECTED Final   Haemophilus influenzae NOT DETECTED NOT DETECTED Final   Neisseria meningitidis NOT DETECTED NOT DETECTED Final   Pseudomonas aeruginosa NOT DETECTED NOT DETECTED Final   Stenotrophomonas maltophilia NOT DETECTED NOT DETECTED Final   Candida albicans NOT DETECTED NOT DETECTED Final   Candida auris NOT DETECTED NOT DETECTED Final   Candida glabrata NOT DETECTED NOT  DETECTED Final   Candida krusei NOT DETECTED NOT DETECTED Final   Candida parapsilosis NOT DETECTED NOT DETECTED Final   Candida tropicalis NOT DETECTED NOT DETECTED Final   Cryptococcus neoformans/gattii NOT DETECTED NOT DETECTED Final   CTX-M ESBL NOT DETECTED NOT DETECTED Final   Carbapenem resistance IMP NOT DETECTED NOT DETECTED Final   Carbapenem resistance KPC NOT DETECTED NOT DETECTED Final   Carbapenem resistance NDM NOT DETECTED NOT DETECTED Final   Carbapenem resist OXA 48 LIKE NOT DETECTED NOT DETECTED Final   Carbapenem resistance VIM NOT DETECTED NOT DETECTED Final    Comment: Performed at [facility], [address], [place], [place] [address]  Urine Culture     Status: Abnormal   Collection Time: 02/07/24  1:40 AM   Specimen: Urine, Random  Result Value Ref Range Status   Specimen Description URINE, RANDOM  Final   Special Requests NONE Reflexed from [id number]  Final   Culture (A)  Final    <10,000 COLONIES/mL INSIGNIFICANT GROWTH Performed at [facility], [address], [place], [place] [address]    Report Status 02/08/2024 FINAL  Final         Radiology Studies: No results found.       Scheduled Meds:  acetaminophen   1,000 mg Oral TID   antiseptic oral rinse  15 mL Topical TID   bisacodyl   10 mg Rectal Daily   dantrolene   100 mg Oral BID   diclofenac  Sodium  2 g Topical QID   docusate sodium   200 mg Oral Daily   lidocaine   1 patch Transdermal Q24H   pantoprazole   40 mg Oral BID   pregabalin   50 mg Oral BID   senna-docusate  1 tablet Oral BID   tamsulosin   0.4 mg Oral QPC supper   Continuous Infusions:      LOS: 5 days    Time spent: 38 minutes    Altagracia Rone, MD [facility]

## 2024-02-11 NOTE — Progress Notes (Signed)
 [facility] [phone] [facility] Liaison Note  [facility] eligibility confirmed and GIP bed offered for today.  Family advised that they are consulting with another hospice agency as well and plan to tour both facilities. They will advise by tomorrow of which facility has been chosen and if bed will be accepted. TOC aware.  Thank you for allowing us  to participate in this patient's care.  Eleanor Nail, LPN [facility] Liaison [phone]

## 2024-02-12 DIAGNOSIS — I70223 Atherosclerosis of native arteries of extremities with rest pain, bilateral legs: Secondary | ICD-10-CM | POA: Diagnosis not present

## 2024-02-12 MED ORDER — HYDROMORPHONE HCL 1 MG/ML IJ SOLN
0.5000 mg | INTRAMUSCULAR | Status: DC
  Administered 2024-02-12 – 2024-02-13 (×2): 0.5 mg via INTRAVENOUS
  Filled 2024-02-12 (×2): qty 0.5

## 2024-02-12 MED ORDER — SENNOSIDES-DOCUSATE SODIUM 8.6-50 MG PO TABS: 1.0000 | ORAL_TABLET | Freq: Every day | ORAL | Status: DC | PRN

## 2024-02-12 MED ORDER — DIPHENHYDRAMINE HCL 25 MG PO CAPS
25.0000 mg | ORAL_CAPSULE | Freq: Once | ORAL | Status: AC | PRN
  Administered 2024-02-12: 25 mg via ORAL
  Filled 2024-02-12: qty 1

## 2024-02-12 MED ORDER — HYDROMORPHONE HCL 1 MG/ML IJ SOLN
1.0000 mg | INTRAMUSCULAR | Status: DC | PRN
  Administered 2024-02-12 (×2): 2 mg via INTRAVENOUS
  Administered 2024-02-13: 1 mg via INTRAVENOUS
  Administered 2024-02-13 (×2): 2 mg via INTRAVENOUS
  Filled 2024-02-12 (×2): qty 2
  Filled 2024-02-12: qty 1
  Filled 2024-02-12 (×2): qty 2

## 2024-02-12 MED ORDER — BISACODYL 10 MG RE SUPP: 10.0000 mg | Freq: Every day | RECTAL | Status: DC | PRN

## 2024-02-12 NOTE — Consult Note (Signed)
 [facility]: Mr. Olberding has been approved for transfer to [facility]. Family is requesting he be moved tomorrow in am. If possible, transport can be set up today to secure transport in am. Please call Hampton Ege, RN [facility] [phone] for coordination of time frame and discharge. Thank you for the opportunity to serve this patient and family.

## 2024-02-12 NOTE — Progress Notes (Signed)
 PROGRESS NOTE    Christian Bailey  FMW:[id number] DOB: 28-Dec-1940 DOA: 02/06/2024 PCP: Janey Santos, MD    Brief Narrative:  83 year old male with DM2, HTN, anemia of chronic disease, PAD, comes in from [facility] with weakness and shortness of breath, was also found to be altered. He was febrile on admission to 102.1, and on further evaluation was found to have sepsis due to pneumonia and possible sacral and heel decubitus ulcer infection.  Patient has chronic indwelling Foley catheter, decubitus ulcers.  Chest x-ray showed left perihilar infiltrate. CT scan shows endobronchial debris. Recent extensive hospitalizations, severe peripheral vascular disease and declining health. Palliative care was consulted. The patient has decided against amputation for now:SABRA The patient and his wife have met with palliative care and have decided for comfort care.  The patient's family had decided on a hospice facility, but changed there minds at the last minute. Discharge was cancelled. Awaiting family decision on placement.  Subjective: The patient is resting comfortably. No new complaints. He states that pain control is adequate.  Assessment & Plan:   Sepsis present on admission secondary to pneumonia, suspected aspiration pneumonia.  Proteus bacteremia. Resolved.   Presented with leukocytosis, fever and shortness of breath.  Blood culture positive for Proteus bacteremia.  Currently remains on high-dose Rocephin .  Day 3 Rocephin .  Changing to Augmentin  to complete total 7 days of therapy. Suspected aspiration pneumonia.  Speech therapy evaluated the patient.  Now on dysphagia diet. Use incentive spirometer as much as possible but difficult for patient to participate.  Neurogenic bladder: Foley catheter exchanged.  Urine cultures less than 10,000 colonies.  Bacteremia likely secondary to pneumonia.  Peripheral vascular disease: Chronic, occluded and thrombotic bypass graft.  Vascular  following.Patient has ischemia of both legs, not salvageable by vascular intervention.  Recommended bilateral above-knee amputation versus palliation.  Hyperlipidemia: On a statin.  CKD stage IIIa: At about baseline.  Type 2 diabetes: On insulin .   Pressure ulcer present on admission: Local wound care.  Reportedly not infected.  Goal of care:  Remains very debilitated with difficult to treat conditions.  Ischemic bilateral lower leg and no options for revascularization. Multiple medical issues, immobility. Updated patient's family at the bedside.  They are meeting with vascular surgery.  Patient is not a candidate to undergo for revascularization or even bilateral above-knee amputation.  He will not recover from this surgery. Pt has decided upon discharge to hospice facility. The family initially had chosen to go to [facility] on 02/11/2024, but then changed their mind. They have now chosen to go to [facility]. The facility has a bed for the patient, but they have told nursing that they don't want the patient transported until tomorrow. The patient is comfort care only.    DVT prophylaxis:  None   Code Status: DNR with intervention Family Communication: Wife and 2 sons at the bedside. Disposition Plan: Status is: Inpatient Remains inpatient appropriate because: IV antibiotics, critical limb ischemia.   Consultants:  Vascular surgery Palliative care  Procedures:  None  Antimicrobials:  Rocephin  8/10--- 8/13 Augmentin  8/13--  Objective: Vitals:   02/10/24 1600 02/10/24 1700 02/10/24 1951 02/12/24 0616  BP:   (!) 107/51 124/80  Pulse:   90 (!) 118  Resp: 11 (!) 9 15 16   Temp:   98.6 F (37 C) 98.2 F (36.8 C)  TempSrc:   Axillary Oral  SpO2: (!) 86% 91% 100% 100%  Weight:      Height:  Intake/Output Summary (Last 24 hours) at 02/12/2024 1544 Last data filed at 02/12/2024 1417 Gross per 24 hour  Intake 60 ml  Output 1050 ml  Net -990 ml   Filed  Weights   02/06/24 1404 02/07/24 0641  Weight: 53.1 kg 57.5 kg    Examination:  Exam:  Constitutional:  The patient is awake, alert, and oriented x 3. No acute distress. Respiratory:  No increased work of breathing. No wheezes, rales, or rhonchi No tactile fremitus Cardiovascular:  Regular rate and rhythm No murmurs, ectopy, or gallups. No lateral PMI. No thrills. Abdomen:  Abdomen is soft, non-tender, non-distended No hernias, masses, or organomegaly Normoactive bowel sounds.  Musculoskeletal:  Lower extremities are cold bilaterally. Skin:  No rashes, lesions, ulcers palpation of skin: no induration or nodules Neurologic:  CN 2-12 intact Sensation all 4 extremities intact Psychiatric:  Mental status Mood, affect appropriate Orientation to person, place, time  judgment and insight appear intact  Data Reviewed: I have personally reviewed following labs and imaging studies  CBC: Recent Labs  Lab 02/06/24 1449 02/07/24 0451 02/09/24 0308 02/10/24 0336  WBC 22.2* 22.3* 14.0* 10.9*  NEUTROABS 22.0*  --  12.9* 9.3*  HGB 8.1* 8.6* 8.5* 9.1*  HCT 24.6* 26.1* 26.2* 29.0*  MCV 88.5 85.9 87.6 88.1  PLT 204 174 183 198   Basic Metabolic Panel: Recent Labs  Lab 02/06/24 1449 02/07/24 0451 02/09/24 0308 02/10/24 0336  NA 134* 136 136 137  K 4.6 4.3 3.7 3.8  CL 98 101 99 102  CO2 24 26 26 26   GLUCOSE 118* 116* 141* 95  BUN 52* 39* 32* 24*  CREATININE 1.49* 1.14 1.07 1.00  CALCIUM  8.0* 7.8* 7.7* 8.0*  MG  --   --  1.9  --    GFR: Estimated Creatinine Clearance: 46.3 mL/min (by C-G formula based on SCr of 1 mg/dL). Liver Function Tests: Recent Labs  Lab 02/06/24 1449 02/07/24 0451 02/10/24 0336  AST 58* 116* 76*  ALT 24 34 32  ALKPHOS 107 68 68  BILITOT 0.6 0.8 0.7  PROT 5.4* 5.1* 5.1*  ALBUMIN  2.0* 1.8* 1.9*   No results for input(s): LIPASE, AMYLASE in the last 168 hours. No results for input(s): AMMONIA in the last 168 hours. Coagulation  Profile: Recent Labs  Lab 02/06/24 1449 02/07/24 0451  INR 1.2 1.2   Cardiac Enzymes: No results for input(s): CKTOTAL, CKMB, CKMBINDEX, TROPONINI in the last 168 hours. BNP (last 3 results) No results for input(s): PROBNP in the last 8760 hours. HbA1C: No results for input(s): HGBA1C in the last 72 hours. CBG: Recent Labs  Lab 02/10/24 0621 02/10/24 0822 02/10/24 1156 02/10/24 1541 02/10/24 2122  GLUCAP 92 73 104* 198* 152*   Lipid Profile: No results for input(s): CHOL, HDL, LDLCALC, TRIG, CHOLHDL, LDLDIRECT in the last 72 hours. Thyroid  Function Tests: No results for input(s): TSH, T4TOTAL, FREET4, T3FREE, THYROIDAB in the last 72 hours. Anemia Panel: No results for input(s): VITAMINB12, FOLATE, FERRITIN, TIBC, IRON, RETICCTPCT in the last 72 hours. Sepsis Labs: Recent Labs  Lab 02/06/24 1510 02/06/24 1752  LATICACIDVEN 3.4* 1.5    Recent Results (from the past 240 hours)  Resp panel by RT-PCR (RSV, Flu A&B, Covid) Anterior Nasal Swab     Status: None   Collection Time: 02/06/24  2:09 PM   Specimen: Anterior Nasal Swab  Result Value Ref Range Status   SARS Coronavirus 2 by RT PCR NEGATIVE NEGATIVE Final   Influenza A by PCR NEGATIVE NEGATIVE  Final   Influenza B by PCR NEGATIVE NEGATIVE Final    Comment: (NOTE) The Xpert Xpress SARS-CoV-2/FLU/RSV plus assay is intended as an aid in the diagnosis of influenza from Nasopharyngeal swab specimens and should not be used as a sole basis for treatment. Nasal washings and aspirates are unacceptable for Xpert Xpress SARS-CoV-2/FLU/RSV testing.  Fact Sheet for Patients: BloggerCourse.com  Fact Sheet for Healthcare Providers: SeriousBroker.it  This test is not yet approved or cleared by the [place]  FDA and has been authorized for detection and/or diagnosis of SARS-CoV-2 by FDA under an Emergency Use Authorization  (EUA). This EUA will remain in effect (meaning this test can be used) for the duration of the COVID-19 declaration under Section 564(b)(1) of the Act, 21 U.S.C. section 360bbb-3(b)(1), unless the authorization is terminated or revoked.     Resp Syncytial Virus by PCR NEGATIVE NEGATIVE Final    Comment: (NOTE) Fact Sheet for Patients: BloggerCourse.com  Fact Sheet for Healthcare Providers: SeriousBroker.it  This test is not yet approved or cleared by the [place]  FDA and has been authorized for detection and/or diagnosis of SARS-CoV-2 by FDA under an Emergency Use Authorization (EUA). This EUA will remain in effect (meaning this test can be used) for the duration of the COVID-19 declaration under Section 564(b)(1) of the Act, 21 U.S.C. section 360bbb-3(b)(1), unless the authorization is terminated or revoked.  Performed at [facility], [address], [place], [place] [address]   Blood Culture (routine x 2)     Status: None   Collection Time: 02/06/24  2:14 PM   Specimen: BLOOD RIGHT FOREARM  Result Value Ref Range Status   Specimen Description BLOOD RIGHT FOREARM  Final   Special Requests   Final    BOTTLES DRAWN AEROBIC AND ANAEROBIC Blood Culture results may not be optimal due to an inadequate volume of blood received in culture bottles   Culture   Final    NO GROWTH 5 DAYS Performed at [facility], [address] [place], [place] [address]    Report Status 02/11/2024 FINAL  Final  Blood Culture (routine x 2)     Status: Abnormal   Collection Time: 02/06/24  2:49 PM   Specimen: BLOOD  Result Value Ref Range Status   Specimen Description BLOOD RIGHT ANTECUBITAL  Final   Special Requests   Final    BOTTLES DRAWN AEROBIC AND ANAEROBIC Blood Culture adequate volume   Culture  Setup Time   Final    GRAM NEGATIVE RODS IN BOTH AEROBIC AND ANAEROBIC BOTTLES CRITICAL RESULT CALLED TO, READ BACK BY AND  VERIFIED WITH: PHARMD G ABBOTT 02/07/2024 @ 0703 BY AB Performed at [facility], [address] [place], [place] [address]    Culture PROTEUS MIRABILIS (A)  Final   Report Status 02/09/2024 FINAL  Final   Organism ID, Bacteria PROTEUS MIRABILIS  Final      Susceptibility   Proteus mirabilis - MIC*    AMPICILLIN <=2 SENSITIVE Sensitive     CEFAZOLIN  Value in next row Intermediate      4 INTERMEDIATEThis is a modified FDA-approved test that has been validated and its performance characteristics determined by the reporting laboratory.  This laboratory is certified under the Clinical Laboratory Improvement Amendments CLIA as qualified to perform high complexity clinical laboratory testing.    CEFEPIME  Value in next row Sensitive      4 INTERMEDIATEThis is a modified FDA-approved test that has been validated and its performance  characteristics determined by the reporting laboratory.  This laboratory is certified under the Clinical Laboratory Improvement Amendments CLIA as qualified to perform high complexity clinical laboratory testing.    ERTAPENEM Value in next row Sensitive      4 INTERMEDIATEThis is a modified FDA-approved test that has been validated and its performance characteristics determined by the reporting laboratory.  This laboratory is certified under the Clinical Laboratory Improvement Amendments CLIA as qualified to perform high complexity clinical laboratory testing.    CEFTRIAXONE  Value in next row Sensitive      4 INTERMEDIATEThis is a modified FDA-approved test that has been validated and its performance characteristics determined by the reporting laboratory.  This laboratory is certified under the Clinical Laboratory Improvement Amendments CLIA as qualified to perform high complexity clinical laboratory testing.    CIPROFLOXACIN Value in next row Sensitive      4 INTERMEDIATEThis is a modified FDA-approved test that has been validated and its performance characteristics  determined by the reporting laboratory.  This laboratory is certified under the Clinical Laboratory Improvement Amendments CLIA as qualified to perform high complexity clinical laboratory testing.    GENTAMICIN Value in next row Sensitive      4 INTERMEDIATEThis is a modified FDA-approved test that has been validated and its performance characteristics determined by the reporting laboratory.  This laboratory is certified under the Clinical Laboratory Improvement Amendments CLIA as qualified to perform high complexity clinical laboratory testing.    MEROPENEM Value in next row Sensitive      4 INTERMEDIATEThis is a modified FDA-approved test that has been validated and its performance characteristics determined by the reporting laboratory.  This laboratory is certified under the Clinical Laboratory Improvement Amendments CLIA as qualified to perform high complexity clinical laboratory testing.    TRIMETH/SULFA Value in next row Sensitive      4 INTERMEDIATEThis is a modified FDA-approved test that has been validated and its performance characteristics determined by the reporting laboratory.  This laboratory is certified under the Clinical Laboratory Improvement Amendments CLIA as qualified to perform high complexity clinical laboratory testing.    AMPICILLIN/SULBACTAM Value in next row Sensitive      4 INTERMEDIATEThis is a modified FDA-approved test that has been validated and its performance characteristics determined by the reporting laboratory.  This laboratory is certified under the Clinical Laboratory Improvement Amendments CLIA as qualified to perform high complexity clinical laboratory testing.    PIP/TAZO Value in next row Sensitive ug/mL     <=4 SENSITIVEThis is a modified FDA-approved test that has been validated and its performance characteristics determined by the reporting laboratory.  This laboratory is certified under the Clinical Laboratory Improvement Amendments CLIA as qualified to  perform high complexity clinical laboratory testing.    * PROTEUS MIRABILIS  Blood Culture ID Panel (Reflexed)     Status: Abnormal   Collection Time: 02/06/24  2:49 PM  Result Value Ref Range Status   Enterococcus faecalis NOT DETECTED NOT DETECTED Final   Enterococcus Faecium NOT DETECTED NOT DETECTED Final   Listeria monocytogenes NOT DETECTED NOT DETECTED Final   Staphylococcus species NOT DETECTED NOT DETECTED Final   Staphylococcus aureus (BCID) NOT DETECTED NOT DETECTED Final   Staphylococcus epidermidis NOT DETECTED NOT DETECTED Final   Staphylococcus lugdunensis NOT DETECTED NOT DETECTED Final   Streptococcus species NOT DETECTED NOT DETECTED Final   Streptococcus agalactiae NOT DETECTED NOT DETECTED Final   Streptococcus pneumoniae NOT DETECTED NOT DETECTED Final   Streptococcus pyogenes NOT DETECTED NOT DETECTED  Final   A.calcoaceticus-baumannii NOT DETECTED NOT DETECTED Final   Bacteroides fragilis NOT DETECTED NOT DETECTED Final   Enterobacterales DETECTED (A) NOT DETECTED Final    Comment: Enterobacterales represent a large order of gram negative bacteria, not a single organism. CRITICAL RESULT CALLED TO, READ BACK BY AND VERIFIED WITH: PHARMD G ABBOTT 02/07/2024 @ 0703 BY AB    Enterobacter cloacae complex NOT DETECTED NOT DETECTED Final   Escherichia coli NOT DETECTED NOT DETECTED Final   Klebsiella aerogenes NOT DETECTED NOT DETECTED Final   Klebsiella oxytoca NOT DETECTED NOT DETECTED Final   Klebsiella pneumoniae NOT DETECTED NOT DETECTED Final   Proteus species DETECTED (A) NOT DETECTED Final    Comment: CRITICAL RESULT CALLED TO, READ BACK BY AND VERIFIED WITH: PHARMD G ABBOTT 02/07/2024 @ 0703 BY AB    Salmonella species NOT DETECTED NOT DETECTED Final   Serratia marcescens NOT DETECTED NOT DETECTED Final   Haemophilus influenzae NOT DETECTED NOT DETECTED Final   Neisseria meningitidis NOT DETECTED NOT DETECTED Final   Pseudomonas aeruginosa NOT DETECTED NOT  DETECTED Final   Stenotrophomonas maltophilia NOT DETECTED NOT DETECTED Final   Candida albicans NOT DETECTED NOT DETECTED Final   Candida auris NOT DETECTED NOT DETECTED Final   Candida glabrata NOT DETECTED NOT DETECTED Final   Candida krusei NOT DETECTED NOT DETECTED Final   Candida parapsilosis NOT DETECTED NOT DETECTED Final   Candida tropicalis NOT DETECTED NOT DETECTED Final   Cryptococcus neoformans/gattii NOT DETECTED NOT DETECTED Final   CTX-M ESBL NOT DETECTED NOT DETECTED Final   Carbapenem resistance IMP NOT DETECTED NOT DETECTED Final   Carbapenem resistance KPC NOT DETECTED NOT DETECTED Final   Carbapenem resistance NDM NOT DETECTED NOT DETECTED Final   Carbapenem resist OXA 48 LIKE NOT DETECTED NOT DETECTED Final   Carbapenem resistance VIM NOT DETECTED NOT DETECTED Final    Comment: Performed at [facility], [address] [place], [place] [address]  Urine Culture     Status: Abnormal   Collection Time: 02/07/24  1:40 AM   Specimen: Urine, Random  Result Value Ref Range Status   Specimen Description URINE, RANDOM  Final   Special Requests NONE Reflexed from [id number]  Final   Culture (A)  Final    <10,000 COLONIES/mL INSIGNIFICANT GROWTH Performed at [facility], [address] [place], [place] [address]    Report Status 02/08/2024 FINAL  Final         Radiology Studies: No results found.       Scheduled Meds:  acetaminophen   1,000 mg Oral TID   dantrolene   100 mg Oral BID    HYDROmorphone  (DILAUDID ) injection  0.5 mg Intravenous Q4H   lidocaine   1 patch Transdermal Q24H   Continuous Infusions:      LOS: 6 days    Time spent: 34 minutes    Juriel Cid, MD [facility]

## 2024-02-12 NOTE — Progress Notes (Signed)
 Daily Progress Note   Patient Name: Christian Bailey       Date: 02/12/2024 DOB: 10-16-1940  Age: 83 y.o. MRN#: [id number] Attending Physician: Soledad Ligas, DO Primary Care Physician: Janey Santos, MD Admit Date: 02/06/2024  Reason for Consultation/Follow-up: Establishing goals of care  Length of Stay: 6  Current Medications: Scheduled Meds:   acetaminophen   1,000 mg Oral TID   antiseptic oral rinse  15 mL Topical TID   bisacodyl   10 mg Rectal Daily   dantrolene   100 mg Oral BID   diclofenac  Sodium  2 g Topical QID   docusate sodium   200 mg Oral Daily   lidocaine   1 patch Transdermal Q24H   pantoprazole   40 mg Oral BID   pregabalin   50 mg Oral BID   senna-docusate  1 tablet Oral BID   tamsulosin   0.4 mg Oral QPC supper    Continuous Infusions:   PRN Meds: artificial tears, fluticasone , HYDROmorphone  (DILAUDID ) injection, LORazepam  **OR** LORazepam , ondansetron  **OR** ondansetron  (ZOFRAN ) IV, simethicone   Physical Exam Vitals reviewed.  Constitutional:      General: He is not in acute distress. HENT:     Head: Normocephalic and atraumatic.  Pulmonary:     Effort: Pulmonary effort is normal.  Skin:    General: Skin is warm and dry.  Neurological:     Mental Status: He is alert and oriented to person, place, and time.  Psychiatric:        Mood and Affect: Mood normal.        Behavior: Behavior normal.        Thought Content: Thought content normal.        Judgment: Judgment normal.             Vital Signs: BP 124/80 (BP Location: Left Arm)   Pulse (!) 118   Temp 98.2 F (36.8 C) (Oral)   Resp 16   Ht 5' 4 (1.626 m)   Wt 57.5 kg   SpO2 100%   BMI 21.76 kg/m  SpO2: SpO2: 100 % O2 Device: O2 Device: Nasal Cannula O2 Flow Rate: O2 Flow Rate (L/min): 2  L/min      Palliative Assessment/Data: 30%      Patient Active Problem List   Diagnosis Date Noted   Critical limb ischemia of both lower extremities (HCC)  02/11/2024   Sepsis due to pneumonia (HCC) 02/06/2024   Pressure injury of deep tissue of foot 11/23/2023   Pressure injury of deep tissue of buttock 11/23/2023   Coping style affecting medical condition 11/11/2023   Protein-calorie malnutrition, severe 11/01/2023   Adjustment disorder with mixed anxiety and depressed mood 10/28/2023   Depression with anxiety 10/25/2023   Urinary retention 10/22/2023   Neurogenic bladder 10/22/2023   Transient iatrogenic urethral bleeding 10/22/2023   Acute incomplete quadriplegia (HCC) 10/19/2023   Spinal cord compression (HCC) 10/18/2023   Unsteady gait 10/18/2023   Neck pain 10/18/2023   Cervical myelopathy (HCC) 10/18/2023   Delirium due to another medical condition, acute, hyperactive 10/09/2023   Intractable back pain 10/03/2023   Fall 10/02/2023   Acute renal failure superimposed on stage 3b chronic kidney disease, unspecified acute renal failure type (HCC) 05/07/2023   Leukocytosis 05/07/2023   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) 03/25/2020   Exudative age-related macular degeneration of right eye with inactive choroidal neovascularization (HCC) 03/25/2020   Advanced nonexudative age-related macular degeneration of right eye with subfoveal involvement 03/25/2020   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 involvement 03/25/2020   Bacteremia due to Gram-negative bacteria 01/19/2019   CKD (chronic kidney disease) stage 3, GFR 30-59 ml/min (HCC) 01/19/2019   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) 07/06/2018   S/P shoulder replacement, right 12/07/2016   PVD (peripheral vascular disease) (HCC) 09/18/2016   Sinus tachycardia 05/15/2014   Carotid  artery disease (HCC) 04/26/2014   PAD (peripheral artery disease) (HCC) 06/12/2013   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 male with PMH significant for HTN, HLD, poorly controlled Type II DM, CKD III and PAD who was admitted  from [facility] with AMS and SOB.  Identified to be septics from pneumonia. Palliative care has been asked to support additional goals of care conversations in the setting of healthcare decisions   Today's Discussion: Reviewed chart. Patient required six prn dilaudid  doses over the last 24 hours. He did not take several of his scheduled medications which also provide comfort such as tylenol , lyrica , protonix , colace, and senna. Patient sleeping in NAD. He easily awakens when I entered the room. States his pain was managed overnight and he slept well. He has no pain at this time. No family at bedside.   We discussed his prn dilaudid  use and plan to schedule dilaudid . Told patient I would discuss with his family before making any changes. Patient shared he feels good knowing his family is advocating for his end-of-life comfort.   10:00 Met with patient and wife in room. Patient now requesting prn dilaudid  for 8/10 pain. Discussed scheduled medications and available PRN medications. Patient and wife agreeable to scheduling dilaudid  every 4 hours. Discussed nonpharmacologic comfort measures such as mouth moisturizer. Family decided on [facility] for [facility] life care. Notified TOC.   Emotional support and therapeutic listening provided.  Encouraged patient and family to call PMT with questions or needs.  PMT will  continue to follow.  Recommendations/Plan: Comfort measures Comfort medications per [facility] Added  scheduled dilaudid  every 4 hours Awaiting transition to hospice facility- family chose [facility] Continued PMT support    Code Status:    Code Status Orders  (From admission, onward)           Start     Ordered   02/10/24 1516  Do not attempt resuscitation (DNR) - Comfort care  Continuous       Question Answer Comment  If patient has no pulse and is not breathing Do Not Attempt Resuscitation   In Pre-Arrest Conditions (Patient Is Breathing and Has a Pulse) Provide comfort measures. Relieve any mechanical airway obstruction. Avoid transfer unless required for comfort.   Consent: Discussion documented in EHR or advanced directives reviewed      02/10/24 1515         Extensive chart review has been completed prior to seeing the patient including vital signs, progress/consult notes, orders, medications, and available advance directive documents.  Care plan was discussed with bedside RN, Dr. Soledad, and TOC  Time spent: 50  minutes  Thank you for allowing the Palliative Medicine Team to assist in the care of this patient.    Stephane CHRISTELLA Palin, NP  Please contact Palliative Medicine Team phone at [phone] for questions and concerns.

## 2024-02-12 NOTE — Progress Notes (Signed)
 Called and gave report to the nurse taking the patient who will be going 6 Nort to bed 31. Patient has all of his belongings. I have called his wife [name] and notified her of the patient's transfer to the new unit and bed.

## 2024-02-12 NOTE — Care Plan (Signed)
 Patient arrived in the unit via bed. Patient is complaining of pain but tolerable. Skin assessment done by 2 RN's. Aox2. Positioned to comfort. Warm blanket provided. Bed alarm on. Call bell within reach.

## 2024-02-12 NOTE — Plan of Care (Signed)
  Problem: Education: Goal: Knowledge of General Education information will improve Description: Including pain rating scale, medication(s)/side effects and non-pharmacologic comfort measures Outcome: Not Progressing   Problem: Health Behavior/Discharge Planning: Goal: Ability to manage health-related needs will improve Outcome: Not Progressing   Problem: Activity: Goal: Risk for activity intolerance will decrease Outcome: Not Progressing   Problem: Pain Managment: Goal: General experience of comfort will improve and/or be controlled Outcome: Not Progressing   Problem: Safety: Goal: Ability to remain free from injury will improve Outcome: Not Progressing   Problem: Skin Integrity: Goal: Risk for impaired skin integrity will decrease Outcome: Not Progressing

## 2024-02-13 DIAGNOSIS — I70223 Atherosclerosis of native arteries of extremities with rest pain, bilateral legs: Secondary | ICD-10-CM | POA: Diagnosis not present

## 2024-02-13 MED ORDER — HYDROXYZINE HCL 10 MG PO TABS
10.0000 mg | ORAL_TABLET | ORAL | Status: DC | PRN
  Administered 2024-02-13: 10 mg via ORAL
  Filled 2024-02-13: qty 1

## 2024-02-13 MED ORDER — HYDROMORPHONE HCL 1 MG/ML IJ SOLN: 1.0000 mg | INTRAMUSCULAR | Status: DC

## 2024-02-13 NOTE — TOC Transition Note (Addendum)
 Transition of Care [facility]) - Discharge Note   Patient Details  Name: Christian Bailey MRN: [id number] Date of Birth: 19-Apr-1941  [facility]) CM/SW Contact:  Olam FORBES Ally, LCSW Phone Number: 02/13/2024, 9:33 AM   Clinical Narrative:    Patient will DC un:[facility] Anticipated DC date:?02/13/2024 Family notified:?By hospice agency Transport by: [place]   Per MD patient ready for DC to [facility]. RN, patient, patient's family, and facility notified of DC. Discharge Summary sent to facility. RN given number for report  [phone]. DC packet on chart. Ambulance transport requested for patient.   Update- 9:50am- CSW spoke with Roxanne  from [facility] and confirmed that patient's family is aware of pending discharge.   CSW signing off.   Olam Ally, [place] [phone]    Final next level of care: Hospice Medical Facility Barriers to Discharge: Barriers Resolved   Patient Goals and CMS Choice Patient states their goals for this hospitalization and ongoing recovery are:: Comfort Care   Choice offered to / list presented to : Spouse      Discharge Placement                       Discharge Plan and Services Additional resources added to the After Visit Summary for   In-house Referral: Clinical Social Work Discharge Planning Services: CM Consult Post Acute Care Choice: Hospice          DME Arranged: N/A DME Agency: NA         HH Agency: [facility] Date [facility] Agency Contacted: 02/09/24   Representative spoke with at [facility] Agency: [place]  Social Drivers of Health (SDOH) Interventions SDOH Screenings   Food Insecurity: Patient Unable To Answer (02/07/2024)  Housing: Unknown (02/07/2024)  Transportation Needs: Patient Unable To Answer (02/07/2024)  Utilities: Patient Unable To Answer (02/07/2024)  Social Connections: Unknown (02/07/2024)  Tobacco Use: Medium Risk (02/06/2024)      Readmission Risk Interventions    02/11/2024    4:32 PM  Readmission Risk Prevention Plan  Transportation Screening Complete  HRI or Home Care Consult Complete  Social Work Consult for Recovery Care Planning/Counseling Complete  Palliative Care Screening Complete  Medication Review Oceanographer) Complete

## 2024-02-13 NOTE — Discharge Summary (Signed)
 Physician Discharge Summary   Patient: Christian Bailey MRN: [id number] DOB: [birth year]  Admit date:     02/06/2024  Discharge date: 02/13/24  Discharge Physician: Brigida Bureau   PCP: Avva, Ravisankar, MD   Recommendations at discharge:    Discharge to hospice for end of life care.   Discharge Diagnoses: Principal Problem:   Critical limb ischemia of both lower extremities (HCC) Active Problems:   Essential hypertension   Hyperlipidemia   Type 2 diabetes mellitus (HCC)   PAD (peripheral artery disease) (HCC)   PVD (peripheral vascular disease) (HCC)   CKD (chronic kidney disease) stage 3, GFR 30-59 ml/min (HCC)   Sepsis due to pneumonia [facility])  Resolved Problems:   * No resolved hospital problems. *  Hospital Course: 83 year old male with DM2, HTN, anemia of chronic disease, PAD, comes in from [facility] with weakness and shortness of breath, was also found to be altered. He was febrile on admission to 102.1, and on further evaluation was found to have sepsis due to pneumonia and possible sacral and heel decubitus ulcer infection.  Patient has chronic indwelling Foley catheter, decubitus ulcers.  Chest x-ray showed left perihilar infiltrate. CT scan shows endobronchial debris. Recent extensive hospitalizations, severe peripheral vascular disease and declining health.   The patient has a chronic occluded and thrombotic bypass graft. Vascular surgery was consulted. The legs are not salvageable. The patient and his family were given the choice of bilateral amputation or comfort care. They have chosen comfort care.    He will be discharged to a hospice facility for end of life care.  Assessment and Plan:  Sepsis present on admission secondary to pneumonia, suspected aspiration pneumonia.  Proteus bacteremia. Resolved.    Presented with leukocytosis, fever and shortness of breath.  Blood culture positive for Proteus bacteremia.  Currently remains on high-dose Rocephin .   Day 3 Rocephin .  Changing to Augmentin  to complete total 7 days of therapy. Suspected aspiration pneumonia.  Speech therapy evaluated the patient.  Now on dysphagia diet. Use incentive spirometer as much as possible but difficult for patient to participate.   Neurogenic bladder: Foley catheter exchanged.  Urine cultures less than 10,000 colonies.  Bacteremia likely secondary to pneumonia.   Peripheral vascular disease: Chronic, occluded and thrombotic bypass graft.  Vascular following.Patient has ischemia of both legs, not salvageable by vascular intervention.  Recommended bilateral above-knee amputation versus palliation. Patient and his family have chosen for comfort care. The patient will be discharged to hospice facility today.   Hyperlipidemia: On a statin.   CKD stage IIIa: At about baseline.   Type 2 diabetes: On insulin .   Pressure ulcer present on admission: Local wound care.  Reportedly not infected.    Consultants: Vascular surgery, palliative care Procedures performed: None  Disposition: Hospice care Diet recommendation:  Discharge Diet Orders (From admission, onward)     Start     Ordered   02/13/24 0000  Diet general        02/13/24 0933           Regular diet DISCHARGE MEDICATION: Allergies as of 02/13/2024       Reactions   Baclofen  Other (See Comments)   Severe delirium on Baclofen  and Flexeril    Flexeril  [cyclobenzaprine ] Other (See Comments)   Confusion/delirium   Asa [aspirin ] Other (See Comments)   GI bleeding   Gadolinium Derivatives Itching, Swelling, Other (See Comments)   Pt complained of face flushing/hottness and throat tightness/scratchiness immediately after the injections.  Within 4 minutes,  all symptoms were gone and the study was completed.  No further complications or signs of allergy were exhibited after completion of study.    Iodinated Contrast Media Other (See Comments)   Pt does not recall reaction   Pneumococcal 13-val Conj Vacc     Other Reaction(s): achiness all over, dizziness, nausea, weakness   Scopolamine     Other Reaction(s): Delerium   Oxycodone -acetaminophen  Other (See Comments)   Dizziness and feeling of being uncomfortable         Medication List     STOP taking these medications    (feeding supplement) PROSource Plus liquid   acetaminophen  325 MG tablet Commonly known as: TYLENOL    apixaban  2.5 MG Tabs tablet Commonly known as: ELIQUIS    ascorbic acid  500 MG tablet Commonly known as: VITAMIN C    bisacodyl  10 MG suppository Commonly known as: DULCOLAX   cadexomer iodine  0.9 % gel Commonly known as: IODOSORB   dantrolene  100 MG capsule Commonly known as: DANTRIUM    diclofenac  Sodium 1 % Gel Commonly known as: VOLTAREN    docusate sodium  100 MG capsule Commonly known as: COLACE   DULoxetine  20 MG capsule Commonly known as: CYMBALTA    feeding supplement Liqd   ferrous sulfate  325 (65 FE) MG tablet   fluticasone  50 MCG/ACT nasal spray Commonly known as: FLONASE    Gerhardt's butt cream Crea   lidocaine  4 %   linagliptin  5 MG Tabs tablet Commonly known as: TRADJENTA    melatonin 5 MG Tabs   menthol -cetylpyridinium 3 MG lozenge Commonly known as: CEPACOL   metoprolol  succinate 25 MG 24 hr tablet Commonly known as: TOPROL -XL   midodrine  2.5 MG tablet Commonly known as: PROAMATINE    multivitamin with minerals Tabs tablet   Muscle Rub 10-15 % Crea   NON FORMULARY   pantoprazole  40 MG tablet Commonly known as: PROTONIX    pregabalin  50 MG capsule Commonly known as: LYRICA    rosuvastatin  20 MG tablet Commonly known as: CRESTOR    senna-docusate 8.6-50 MG tablet Commonly known as: Senokot-S   simethicone  80 MG chewable tablet Commonly known as: MYLICON   tamsulosin  0.4 MG Caps capsule Commonly known as: FLOMAX    traMADol  50 MG tablet Commonly known as: ULTRAM                Discharge Care Instructions  (From admission, onward)            Start     Ordered   02/11/24 0000  Discharge wound care:       Comments: 1 Sacrum cleanse with normal saline, reapply sacrum foam, change every 3 days, date dressing, assess every shift #2 Right and Left Heels: cleanse normal saline, pat dry and reapply 4x4 foam dressing, reapply boots, change every 3 days   02/11/24 1648            Discharge Exam: Filed Weights   02/06/24 1404 02/07/24 0641  Weight: 53.1 kg 57.5 kg   Exam:   Constitutional:  The patient is awake, alert, and oriented x 3. No acute distress. Eyes:  pupils and irises appear normal Normal lids and conjunctivae ENMT:  grossly normal hearing  Lips appear normal external ears, nose appear normal Oropharynx: mucosa, tongue,posterior pharynx appear normal Neck:  neck appears normal, no masses, normal ROM, supple no thyromegaly Respiratory:  No increased work of breathing. No wheezes, rales, or rhonchi No tactile fremitus Cardiovascular:  Regular rate and rhythm No murmurs, ectopy, or gallups. No lateral PMI. No thrills. Abdomen:  Abdomen is soft, non-tender,  non-distended No hernias, masses, or organomegaly Normoactive bowel sounds.  Musculoskeletal:  No cyanosis, clubbing, or edema Lower extremities are cold bilaterally. Skin:  No rashes, lesions, ulcers palpation of skin: no induration or nodules Neurologic:  CN 2-12 intact Sensation all 4 extremities intact Psychiatric:  Mental status Mood, affect appropriate Orientation to person, place, time  judgment and insight appear intact  Condition at discharge: poor  The results of significant diagnostics from this hospitalization (including imaging, microbiology, ancillary and laboratory) are listed below for reference.   Imaging Studies: CT ANGIO AO+BIFEM W & OR WO CONTRAST Result Date: 02/07/2024 CLINICAL DATA:  Critical limb ischemia, sepsis EXAM: CT ANGIOGRAPHY OF ABDOMINAL AORTA WITH ILIOFEMORAL RUNOFF TECHNIQUE: Multidetector CT imaging of  the abdomen, pelvis and lower extremities was performed using the standard protocol during bolus administration of intravenous contrast. Multiplanar CT image reconstructions and MIPs were obtained to evaluate the vascular anatomy. RADIATION DOSE REDUCTION: This exam was performed according to the departmental dose-optimization program which includes automated exposure control, adjustment of the mA and/or kV according to patient size and/or use of iterative reconstruction technique. CONTRAST:  OMNIPAQUE  IOHEXOL  350 MG/ML SOLN COMPARISON:  02/03/2018 FINDINGS: VASCULAR Aorta: There is thrombosis of the infrarenal abdominal aorta just above the level of the aortobifemoral bypass graft. The bypass graft itself is thrombosed. The native aorta and bilateral lower extremity arterial inflow is thrombosed. Celiac: Patent without evidence of aneurysm, dissection, vasculitis or significant stenosis. SMA: Patent without evidence of aneurysm, dissection, vasculitis or significant stenosis. Renals: 50% stenosis of the right renal artery at its origin. Left renal artery is widely patent. Normal vascular morphology. No aneurysm or dissection. IMA: Thrombosed proximally with reconstitution via the marginal artery RIGHT Lower Extremity Inflow: Thrombosed Outflow: The profundus femoral artery is thrombosed proximally and is reconstituted via the right reconstituted obturator artery. The stented superficial femoral artery remains thrombosed. Runoff: The runoff vasculature of the right lower extremity appears thrombosed without significant reconstitution via the muscular collaterals arising from the profundus. LEFT Lower Extremity Inflow: Thrombosed Outflow: There is weak reconstitution of profundus collaterals via the reconstituted left obturator and superior gluteal artery. Common femoral artery and superficial femoral artery are thrombosed. Runoff: The runoff vasculature of the left lower extremity is thrombosed. No significant  reconstitution is identified. Veins: No obvious venous abnormality within the limitations of this arterial phase study. Review of the MIP images confirms the above findings. NON-VASCULAR Lower chest: Largely excluded Hepatobiliary: No focal liver abnormality is seen within the visualized liver. Status post cholecystectomy. Mild intra and extrahepatic biliary ductal dilation likely represents post cholecystectomy change. Pancreas: Unremarkable Spleen: The visualized portion is unremarkable Adrenals/Urinary Tract: Adrenal glands are unremarkable. Simple cortical cysts are seen within the kidneys bilaterally for which no follow-up imaging is recommended. The kidneys are otherwise unremarkable. Foley catheter balloon seen within a decompressed bladder lumen. Stomach/Bowel: Stomach is within normal limits. Appendix appears normal. No evidence of bowel wall thickening, distention, or inflammatory changes. Mild ascites no free intraperitoneal gas. Lymphatic: No pathologic adenopathy within the abdomen and pelvis. Reproductive: Prostate gland is normal in size. There is a 14 mm hypoattenuating nodule within the left lobe of the prostate gland which is not well characterized on this examination. This may represent a malignant nodule or a area focal prostatitis. Other: Mild diffuse subcutaneous body edema. Musculoskeletal: Osseous structures are age-appropriate. No acute bone abnormality. IMPRESSION: 1. Thrombosis of the infrarenal abdominal aorta just above the level of the aortobifemoral bypass graft. The bypass graft itself  is thrombosed. The native aorta and bilateral lower extremity arterial inflow is thrombosed. 2. Weak reconstitution of the profundus femoral vasculature bilaterally, right greater than left, via reconstituted internal iliac collaterals. Common femoral and superficial femoral arteries are thrombosed. 3. Thrombosis of the runoff vasculature of the lower extremities bilaterally without significant  reconstitution. 4. 50% stenosis of the right renal artery at its origin. 5. Anasarca with mild ascites and subcutaneous body wall edema. 6. 14 mm hypoattenuating nodule within the left lobe of the prostate gland. Correlation with serum PSA level may be helpful for further management. Aortic Atherosclerosis (ICD10-I70.0). Electronically Signed   By: Dorethia Molt M.D.   On: 02/07/2024 20:07   DG Foot Complete Right Result Date: 02/06/2024 CLINICAL DATA:  Foot wounds, sepsis. EXAM: RIGHT FOOT COMPLETE - 3+ VIEW COMPARISON:  None Available. FINDINGS: There is no evidence of fracture or dislocation. No erosion or bony destructive change. Hammertoe deformity of the digits. Osteoarthritis of the first metatarsal phalangeal joint. There is thinning of the soft tissues posterior to the calcaneus. No soft tissue gas or radiopaque foreign body. IMPRESSION: 1. Thinning of the soft tissues posterior to the calcaneus. No radiographic findings of osteomyelitis. 2. Osteoarthritis of the first metatarsophalangeal joint. Electronically Signed   By: Andrea Gasman M.D.   On: 02/06/2024 15:51   DG Foot Complete Left Result Date: 02/06/2024 CLINICAL DATA:  Sepsis, foot wounds. EXAM: LEFT FOOT - COMPLETE 3+ VIEW COMPARISON:  None Available. FINDINGS: There is no evidence of fracture or dislocation. No erosive or bony destructive change. The site of wound is not well demonstrated by radiograph. No radiopaque foreign body or evidence of soft tissue gas. IMPRESSION: No radiographic findings of osteomyelitis. The site of wound is not well demonstrated by radiograph. Electronically Signed   By: Andrea Gasman M.D.   On: 02/06/2024 15:50   DG Chest Port 1 View Result Date: 02/06/2024 CLINICAL DATA:  Fever. EXAM: PORTABLE CHEST 1 VIEW COMPARISON:  10/25/2023 FINDINGS: The cardiomediastinal contours are normal. Mild hazy right perihilar opacity. Pulmonary vasculature is normal. No pleural effusion or pneumothorax. No acute osseous  abnormalities are seen. IMPRESSION: Mild hazy right perihilar opacity may represent pneumonia in the appropriate clinical setting. Electronically Signed   By: Andrea Gasman M.D.   On: 02/06/2024 15:49   CT ABDOMEN PELVIS WO CONTRAST Result Date: 02/06/2024 CLINICAL DATA:  Sepsis, mental status change. EXAM: CT ABDOMEN AND PELVIS WITHOUT CONTRAST TECHNIQUE: Multidetector CT imaging of the abdomen and pelvis was performed following the standard protocol without IV contrast. RADIATION DOSE REDUCTION: This exam was performed according to the departmental dose-optimization program which includes automated exposure control, adjustment of the mA and/or kV according to patient size and/or use of iterative reconstruction technique. COMPARISON:  01/18/2019. FINDINGS: Lower chest: Dependent atelectasis in the right lower lobe. Endobronchial debris and peribronchovascular nodularity in the left lower lobe. Heart is at the upper limits of normal in size. Decreased attenuation of the intravascular compartment is indicative of anemia. No pericardial or pleural effusion. Distal esophagus is grossly unremarkable. Hepatobiliary: Small hepatic cysts. No specific follow-up necessary. Liver is otherwise grossly unremarkable. Cholecystectomy. No unexpected biliary ductal dilatation. Pancreas: Negative. Spleen: Negative. Adrenals/Urinary Tract: Adrenal glands are unremarkable. Low-attenuation and subcentimeter hyperdense lesions in the kidneys. No specific follow-up necessary. Scarring in the lower pole left kidney. Tiny right renal stones. Ureters are decompressed. Granular stone debris in the right posterolateral bladder. There may be minimal bladder wall thickening. Foley catheter balloon is inflated in the posterior  urethra. Stomach/Bowel: Stomach, small bowel and colon are unremarkable. Appendix is not readily visualized. Vascular/Lymphatic: Aortobifemoral arterial bypass graft. Atherosclerotic calcification of the aorta. No  pathologically enlarged lymph nodes. Reproductive: Prostate is mildly prominent. Other: No free fluid. Musculoskeletal: Osteopenia.  Degenerative changes in the spine. IMPRESSION: 1. Endobronchial debris and peribronchovascular nodularity in the left lower lobe, indicative of aspiration. 2. Tiny right renal stones. Granular stone debris layers in the right posterolateral bladder. Possible minimal bladder wall thickening. 3. Foley catheter balloon is inflated in the posterior urethra. 4.  Aortic atherosclerosis (ICD10-I70.0). Electronically Signed   By: Newell Eke M.D.   On: 02/06/2024 15:18   CT Head Wo Contrast Result Date: 02/06/2024 CLINICAL DATA:  Altered mental status. EXAM: CT HEAD WITHOUT CONTRAST TECHNIQUE: Contiguous axial images were obtained from the base of the skull through the vertex without intravenous contrast. RADIATION DOSE REDUCTION: This exam was performed according to the departmental dose-optimization program which includes automated exposure control, adjustment of the mA and/or kV according to patient size and/or use of iterative reconstruction technique. COMPARISON:  10/05/2023 FINDINGS: Brain: No evidence of acute infarction, hemorrhage, hydrocephalus, extra-axial collection or mass lesion/mass effect. Minimal chronic ischemic microvascular disease. Vascular: No hyperdense vessel or unexpected calcification. Skull: Normal. Negative for fracture or focal lesion. Sinuses/Orbits: No acute finding. Other: None. IMPRESSION: 1. No acute findings. 2. Minimal chronic ischemic microvascular disease. Electronically Signed   By: Toribio Agreste M.D.   On: 02/06/2024 15:17   MR THORACIC SPINE WO CONTRAST Result Date: 02/02/2024 CLINICAL DATA:  Spinal stenosis, weakness EXAM: MRI THORACIC SPINE WITHOUT CONTRAST TECHNIQUE: Multiplanar, multisequence MR imaging of the thoracic spine was performed. No intravenous contrast was administered. COMPARISON:  None Available. FINDINGS: Alignment: Normal  Bone marrow signal: No significant abnormality Thoracic spinal cord: There is epidural lipomatosis with narrowing of the thecal sac which is most significant at that T6-T9 levels there is a small cyst within the epidural fat at the T5 level. Facet joints: No significant abnormality Intervertebral discs: There is no disc herniation. There is degenerative disc disease with partial ankylosis of the vertebral bodies at the T5-6, T6-7, T7-8 and T8-9 levels. Paraspinal tissues: No significant abnormality IMPRESSION: There is narrowing of the thecal sac due to epidural lipomatosis centered at the T6-T9 levels. No significant abnormality within the thoracic spinal cord. Electronically Signed   By: Nancyann Burns M.D.   On: 02/02/2024 08:35   MR CERVICAL SPINE WO CONTRAST Result Date: 02/02/2024 CLINICAL DATA:  Cervical spinal stenosis EXAM: MRI CERVICAL SPINE WITHOUT CONTRAST TECHNIQUE: Multiplanar, multisequence MR imaging of the cervical spine was performed. No intravenous contrast was administered. COMPARISON:  None Available. FINDINGS: The craniocervical junction is normal. No significant bone marrow signal abnormality The cervical spinal cord is normal. C2-C3: The disc height is preserved. The facet joints are normal. No spinal stenosis or foraminal stenosis C3-C4: There is ankylosis of the vertebral bodies. No significant facet disease. The spinal canal is small but there is no significant spinal stenosis. No foraminal stenosis C4-C5: There is a mild disc bulge. No significant facet disease. There is no significant spinal stenosis or foraminal stenosis C5-C6: Previous anterior fusion. No significant spinal stenosis or foraminal stenosis C6-C7: Previous anterior fusion. There is moderate spinal stenosis caused by bone growth at the level just below the vertebral endplate with indention of the cord. C7-T1: The disc is normal. There is a vertebral endplate spur with mild spinal stenosis. This is causing mild spinal  stenosis IMPRESSION: Congenitally small spinal  canal. Previous anterior fusion at C5-C7. There is spinal stenosis at the level of C7 due to bone growth associated with effusion with indention of the cord. Electronically Signed   By: Nancyann Burns M.D.   On: 02/02/2024 08:12    Microbiology: Results for orders placed or performed during the hospital encounter of 02/06/24  Resp panel by RT-PCR (RSV, Flu A&B, Covid) Anterior Nasal Swab     Status: None   Collection Time: 02/06/24  2:09 PM   Specimen: Anterior Nasal Swab  Result Value Ref Range Status   SARS Coronavirus 2 by RT PCR NEGATIVE NEGATIVE Final   Influenza A by PCR NEGATIVE NEGATIVE Final   Influenza B by PCR NEGATIVE NEGATIVE Final    Comment: (NOTE) The Xpert Xpress SARS-CoV-2/FLU/RSV plus assay is intended as an aid in the diagnosis of influenza from Nasopharyngeal swab specimens and should not be used as a sole basis for treatment. Nasal washings and aspirates are unacceptable for Xpert Xpress SARS-CoV-2/FLU/RSV testing.  Fact Sheet for Patients: BloggerCourse.com  Fact Sheet for Healthcare Providers: SeriousBroker.it  This test is not yet approved or cleared by the [place]  FDA and has been authorized for detection and/or diagnosis of SARS-CoV-2 by FDA under an Emergency Use Authorization (EUA). This EUA will remain in effect (meaning this test can be used) for the duration of the COVID-19 declaration under Section 564(b)(1) of the Act, 21 U.S.C. section 360bbb-3(b)(1), unless the authorization is terminated or revoked.     Resp Syncytial Virus by PCR NEGATIVE NEGATIVE Final    Comment: (NOTE) Fact Sheet for Patients: BloggerCourse.com  Fact Sheet for Healthcare Providers: SeriousBroker.it  This test is not yet approved or cleared by the [place]  FDA and has been authorized for detection and/or diagnosis of  SARS-CoV-2 by FDA under an Emergency Use Authorization (EUA). This EUA will remain in effect (meaning this test can be used) for the duration of the COVID-19 declaration under Section 564(b)(1) of the Act, 21 U.S.C. section 360bbb-3(b)(1), unless the authorization is terminated or revoked.  Performed at [facility], [address], [place], [place] [address]   Blood Culture (routine x 2)     Status: None   Collection Time: 02/06/24  2:14 PM   Specimen: BLOOD RIGHT FOREARM  Result Value Ref Range Status   Specimen Description BLOOD RIGHT FOREARM  Final   Special Requests   Final    BOTTLES DRAWN AEROBIC AND ANAEROBIC Blood Culture results may not be optimal due to an inadequate volume of blood received in culture bottles   Culture   Final    NO GROWTH 5 DAYS Performed at [facility], [address] [place], [place] [address]    Report Status 02/11/2024 FINAL  Final  Blood Culture (routine x 2)     Status: Abnormal   Collection Time: 02/06/24  2:49 PM   Specimen: BLOOD  Result Value Ref Range Status   Specimen Description BLOOD RIGHT ANTECUBITAL  Final   Special Requests   Final    BOTTLES DRAWN AEROBIC AND ANAEROBIC Blood Culture adequate volume   Culture  Setup Time   Final    GRAM NEGATIVE RODS IN BOTH AEROBIC AND ANAEROBIC BOTTLES CRITICAL RESULT CALLED TO, READ BACK BY AND VERIFIED WITH: PHARMD G ABBOTT 02/07/2024 @ 0703 BY AB Performed at [facility], [address] [place], [place] [address]    Culture PROTEUS MIRABILIS (A)  Final   Report Status 02/09/2024 FINAL  Final   Organism ID, Bacteria PROTEUS MIRABILIS  Final      Susceptibility   Proteus mirabilis - MIC*    AMPICILLIN <=2 SENSITIVE Sensitive     CEFAZOLIN  Value in next row Intermediate      4 INTERMEDIATEThis is a modified FDA-approved test that has been validated and its performance characteristics determined by the reporting laboratory.  This laboratory is certified under the Clinical  Laboratory Improvement Amendments CLIA as qualified to perform high complexity clinical laboratory testing.    CEFEPIME  Value in next row Sensitive      4 INTERMEDIATEThis is a modified FDA-approved test that has been validated and its performance characteristics determined by the reporting laboratory.  This laboratory is certified under the Clinical Laboratory Improvement Amendments CLIA as qualified to perform high complexity clinical laboratory testing.    ERTAPENEM Value in next row Sensitive      4 INTERMEDIATEThis is a modified FDA-approved test that has been validated and its performance characteristics determined by the reporting laboratory.  This laboratory is certified under the Clinical Laboratory Improvement Amendments CLIA as qualified to perform high complexity clinical laboratory testing.    CEFTRIAXONE  Value in next row Sensitive      4 INTERMEDIATEThis is a modified FDA-approved test that has been validated and its performance characteristics determined by the reporting laboratory.  This laboratory is certified under the Clinical Laboratory Improvement Amendments CLIA as qualified to perform high complexity clinical laboratory testing.    CIPROFLOXACIN Value in next row Sensitive      4 INTERMEDIATEThis is a modified FDA-approved test that has been validated and its performance characteristics determined by the reporting laboratory.  This laboratory is certified under the Clinical Laboratory Improvement Amendments CLIA as qualified to perform high complexity clinical laboratory testing.    GENTAMICIN Value in next row Sensitive      4 INTERMEDIATEThis is a modified FDA-approved test that has been validated and its performance characteristics determined by the reporting laboratory.  This laboratory is certified under the Clinical Laboratory Improvement Amendments CLIA as qualified to perform high complexity clinical laboratory testing.    MEROPENEM Value in next row Sensitive      4  INTERMEDIATEThis is a modified FDA-approved test that has been validated and its performance characteristics determined by the reporting laboratory.  This laboratory is certified under the Clinical Laboratory Improvement Amendments CLIA as qualified to perform high complexity clinical laboratory testing.    TRIMETH/SULFA Value in next row Sensitive      4 INTERMEDIATEThis is a modified FDA-approved test that has been validated and its performance characteristics determined by the reporting laboratory.  This laboratory is certified under the Clinical Laboratory Improvement Amendments CLIA as qualified to perform high complexity clinical laboratory testing.    AMPICILLIN/SULBACTAM Value in next row Sensitive      4 INTERMEDIATEThis is a modified FDA-approved test that has been validated and its performance characteristics determined by the reporting laboratory.  This laboratory is certified under the Clinical Laboratory Improvement Amendments CLIA as qualified to perform high complexity clinical laboratory testing.    PIP/TAZO Value in next row Sensitive ug/mL     <=4 SENSITIVEThis is a modified FDA-approved test that has been validated and its performance characteristics determined by the reporting laboratory.  This laboratory is certified under the Clinical Laboratory Improvement Amendments CLIA as qualified to perform high complexity clinical laboratory testing.    * PROTEUS MIRABILIS  Blood Culture ID Panel (Reflexed)     Status: Abnormal  Collection Time: 02/06/24  2:49 PM  Result Value Ref Range Status   Enterococcus faecalis NOT DETECTED NOT DETECTED Final   Enterococcus Faecium NOT DETECTED NOT DETECTED Final   Listeria monocytogenes NOT DETECTED NOT DETECTED Final   Staphylococcus species NOT DETECTED NOT DETECTED Final   Staphylococcus aureus (BCID) NOT DETECTED NOT DETECTED Final   Staphylococcus epidermidis NOT DETECTED NOT DETECTED Final   Staphylococcus lugdunensis NOT DETECTED NOT  DETECTED Final   Streptococcus species NOT DETECTED NOT DETECTED Final   Streptococcus agalactiae NOT DETECTED NOT DETECTED Final   Streptococcus pneumoniae NOT DETECTED NOT DETECTED Final   Streptococcus pyogenes NOT DETECTED NOT DETECTED Final   A.calcoaceticus-baumannii NOT DETECTED NOT DETECTED Final   Bacteroides fragilis NOT DETECTED NOT DETECTED Final   Enterobacterales DETECTED (A) NOT DETECTED Final    Comment: Enterobacterales represent a large order of gram negative bacteria, not a single organism. CRITICAL RESULT CALLED TO, READ BACK BY AND VERIFIED WITH: PHARMD G ABBOTT 02/07/2024 @ 0703 BY AB    Enterobacter cloacae complex NOT DETECTED NOT DETECTED Final   Escherichia coli NOT DETECTED NOT DETECTED Final   Klebsiella aerogenes NOT DETECTED NOT DETECTED Final   Klebsiella oxytoca NOT DETECTED NOT DETECTED Final   Klebsiella pneumoniae NOT DETECTED NOT DETECTED Final   Proteus species DETECTED (A) NOT DETECTED Final    Comment: CRITICAL RESULT CALLED TO, READ BACK BY AND VERIFIED WITH: PHARMD G ABBOTT 02/07/2024 @ 0703 BY AB    Salmonella species NOT DETECTED NOT DETECTED Final   Serratia marcescens NOT DETECTED NOT DETECTED Final   Haemophilus influenzae NOT DETECTED NOT DETECTED Final   Neisseria meningitidis NOT DETECTED NOT DETECTED Final   Pseudomonas aeruginosa NOT DETECTED NOT DETECTED Final   Stenotrophomonas maltophilia NOT DETECTED NOT DETECTED Final   Candida albicans NOT DETECTED NOT DETECTED Final   Candida auris NOT DETECTED NOT DETECTED Final   Candida glabrata NOT DETECTED NOT DETECTED Final   Candida krusei NOT DETECTED NOT DETECTED Final   Candida parapsilosis NOT DETECTED NOT DETECTED Final   Candida tropicalis NOT DETECTED NOT DETECTED Final   Cryptococcus neoformans/gattii NOT DETECTED NOT DETECTED Final   CTX-M ESBL NOT DETECTED NOT DETECTED Final   Carbapenem resistance IMP NOT DETECTED NOT DETECTED Final   Carbapenem resistance KPC NOT DETECTED  NOT DETECTED Final   Carbapenem resistance NDM NOT DETECTED NOT DETECTED Final   Carbapenem resist OXA 48 LIKE NOT DETECTED NOT DETECTED Final   Carbapenem resistance VIM NOT DETECTED NOT DETECTED Final    Comment: Performed at [facility], [address], [place], [place] [address]  Urine Culture     Status: Abnormal   Collection Time: 02/07/24  1:40 AM   Specimen: Urine, Random  Result Value Ref Range Status   Specimen Description URINE, RANDOM  Final   Special Requests NONE Reflexed from [id number]  Final   Culture (A)  Final    <10,000 COLONIES/mL INSIGNIFICANT GROWTH Performed at [facility], [address] [place], [place] [address]    Report Status 02/08/2024 FINAL  Final    Labs: CBC: Recent Labs  Lab 02/06/24 1449 02/07/24 0451 02/09/24 0308 02/10/24 0336  WBC 22.2* 22.3* 14.0* 10.9*  NEUTROABS 22.0*  --  12.9* 9.3*  HGB 8.1* 8.6* 8.5* 9.1*  HCT 24.6* 26.1* 26.2* 29.0*  MCV 88.5 85.9 87.6 88.1  PLT 204 174 183 198   Basic Metabolic Panel: Recent Labs  Lab 02/06/24 1449 02/07/24 0451 02/09/24 0308 02/10/24  0336  NA 134* 136 136 137  K 4.6 4.3 3.7 3.8  CL 98 101 99 102  CO2 24 26 26 26   GLUCOSE 118* 116* 141* 95  BUN 52* 39* 32* 24*  CREATININE 1.49* 1.14 1.07 1.00  CALCIUM  8.0* 7.8* 7.7* 8.0*  MG  --   --  1.9  --    Liver Function Tests: Recent Labs  Lab 02/06/24 1449 02/07/24 0451 02/10/24 0336  AST 58* 116* 76*  ALT 24 34 32  ALKPHOS 107 68 68  BILITOT 0.6 0.8 0.7  PROT 5.4* 5.1* 5.1*  ALBUMIN  2.0* 1.8* 1.9*   CBG: Recent Labs  Lab 02/10/24 0621 02/10/24 0822 02/10/24 1156 02/10/24 1541 02/10/24 2122  GLUCAP 92 73 104* 198* 152*    Discharge time spent: greater than 30 minutes.  Signed: Bryam Taborda, DO [facility] 02/13/2024

## 2024-02-13 NOTE — Consult Note (Signed)
 [facility]: Pt has been set up for transport at 11:00 am to [address] [employer]). His son has been updated and in agreement. Please call report directly to [phone]. Thank you for the opportunity to serve this patient and family. Hampton Ege, RN [facility] [phone]

## 2024-02-13 NOTE — Progress Notes (Signed)
 Daily Progress Note   Patient Name: Christian Bailey       Date: 02/13/2024 DOB: 1940/10/11  Age: 83 y.o. MRN#: [id number] Attending Physician: Christian Ligas, DO Primary Care Physician: Christian Santos, MD Admit Date: 02/06/2024  Reason for Consultation/Follow-up: Establishing goals of care  Length of Stay: 7  Current Medications: Scheduled Meds:   acetaminophen   1,000 mg Oral TID   dantrolene   100 mg Oral BID    HYDROmorphone  (DILAUDID ) injection  1 mg Intravenous Q4H   lidocaine   1 patch Transdermal Q24H    Continuous Infusions:   PRN Meds: artificial tears, bisacodyl , HYDROmorphone  (DILAUDID ) injection, hydrOXYzine , LORazepam  **OR** LORazepam , ondansetron  **OR** ondansetron  (ZOFRAN ) IV, senna-docusate  Physical Exam Vitals reviewed.  Constitutional:      General: He is sleeping. He is not in acute distress. HENT:     Head: Normocephalic and atraumatic.  Pulmonary:     Effort: Pulmonary effort is normal.  Skin:    General: Skin is warm and dry.             Vital Signs: BP (!) 135/54 (BP Location: Left Arm)   Pulse (!) 110   Temp 97.6 F (36.4 C) (Oral)   Resp 18   Ht 5' 4 (1.626 m)   Wt 57.5 kg   SpO2 100%   BMI 21.76 kg/m  SpO2: SpO2: 100 % O2 Device: O2 Device: Room Air O2 Flow Rate: O2 Flow Rate (L/min): 2 L/min      Palliative Assessment/Data: 30%      Patient Active Problem List   Diagnosis Date Noted   Critical limb ischemia of both lower extremities (HCC) 02/11/2024   Sepsis due to pneumonia (HCC) 02/06/2024   Pressure injury of deep tissue of foot 11/23/2023   Pressure injury of deep tissue of buttock 11/23/2023   Coping style affecting medical condition 11/11/2023   Protein-calorie malnutrition, severe 11/01/2023   Adjustment disorder with  mixed anxiety and depressed mood 10/28/2023   Depression with anxiety 10/25/2023   Urinary retention 10/22/2023   Neurogenic bladder 10/22/2023   Transient iatrogenic urethral bleeding 10/22/2023   Acute incomplete quadriplegia (HCC) 10/19/2023   Spinal cord compression (HCC) 10/18/2023   Unsteady gait 10/18/2023   Neck pain 10/18/2023   Cervical myelopathy (HCC) 10/18/2023   Delirium due to another medical condition, acute, hyperactive  10/09/2023   Intractable back pain 10/03/2023   Fall 10/02/2023   Acute renal failure superimposed on stage 3b chronic kidney disease, unspecified acute renal failure type (HCC) 05/07/2023   Leukocytosis 05/07/2023   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) 03/25/2020   Exudative age-related macular degeneration of right eye with inactive choroidal neovascularization (HCC) 03/25/2020   Advanced nonexudative age-related macular degeneration of right eye with subfoveal involvement 03/25/2020   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 involvement 03/25/2020   Bacteremia due to Gram-negative bacteria 01/19/2019   CKD (chronic kidney disease) stage 3, GFR 30-59 ml/min (HCC) 01/19/2019   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) 07/06/2018   S/P shoulder replacement, right 12/07/2016   PVD (peripheral vascular disease) (HCC) 09/18/2016   Sinus tachycardia 05/15/2014   Carotid artery disease (HCC) 04/26/2014   PAD (peripheral artery disease) (HCC) 06/12/2013   RBBB (right bundle branch block with left anterior fascicular block) 06/12/2013   Claudication (HCC) 12/14/2012   Essential hypertension 12/14/2012   Hyperlipidemia 12/14/2012   Type 2 diabetes mellitus (HCC) 12/14/2012   History of colonic polyps 01/25/2012   DM 08/11/2010   Acute duodenal ulcer with hemorrhage 08/11/2010    Palliative Care Assessment & Plan   Patient  Profile: 83 y.o. male with PMH significant for HTN, HLD, poorly controlled Type II DM, CKD III and PAD who was admitted  from [facility] with AMS and SOB.  Identified to be septics from pneumonia. Palliative care has been asked to support additional goals of care conversations in the setting of healthcare decisions   Today's Discussion: Reviewed chart. Patient required four prn dilaudid  doses over the last 24 hours but refused three scheduled doses. Patient sleeping in NAD. He did not easily awaken when I entered the room. No family at bedside.   8:25: Spoke to patient's wife Christian Bailey and son Christian Bailey by phone. We discussed the patient's pain requirements and agreed to increasing his scheduled IV dilaudid  to 1 mg every 4 hours. Family feels good with this plan. They are waiting for [facility] to send paperwork for [facility] to sign. Plan remains discharging to [facility] today.  Emotional support and therapeutic listening provided.  Encouraged patient and family to call PMT with questions or needs.  PMT will continue to follow.  Recommendations/Plan: Comfort measures Comfort medications per [facility] Increased scheduled IV dilaudid  to 1 mg every 4 hours Awaiting transition to hospice facility- [facility] Continued PMT support    Code Status:    Code Status Orders  (From admission, onward)           Start     Ordered   02/10/24 1516  Do not attempt resuscitation (DNR) - Comfort care  Continuous       Question Answer Comment  If patient has no pulse and is not breathing Do Not Attempt Resuscitation   In Pre-Arrest Conditions (Patient Is Breathing and Has a Pulse) Provide comfort measures. Relieve any mechanical airway obstruction. Avoid transfer unless required for comfort.   Consent: Discussion documented in EHR or advanced directives reviewed      02/10/24 1515         Extensive chart review has been completed prior to seeing the  patient including vital signs, progress/consult notes, orders, medications, and available advance directive documents.  Care plan was discussed with bedside RN  Time spent: 25  minutes  Thank  you for allowing the Palliative Medicine Team to assist in the care of this patient.    Christian CHRISTELLA Palin, NP  Please contact Palliative Medicine Team phone at [phone] for questions and concerns.

## 2024-02-13 NOTE — Progress Notes (Signed)
 As per RN at [facility]  , ok to leave IV's in.

## 2024-02-28 DEATH — deceased

## 2024-03-03 ENCOUNTER — Other Ambulatory Visit: Payer: Self-pay | Admitting: Surgery

## 2024-03-03 DIAGNOSIS — I739 Peripheral vascular disease, unspecified: Secondary | ICD-10-CM

## 2024-03-03 DIAGNOSIS — Z95828 Presence of other vascular implants and grafts: Secondary | ICD-10-CM

## 2024-04-03 ENCOUNTER — Other Ambulatory Visit (HOSPITAL_COMMUNITY)

## 2024-04-03 ENCOUNTER — Ambulatory Visit: Admitting: Surgery

## 2024-04-03 ENCOUNTER — Encounter (HOSPITAL_COMMUNITY)
